# Patient Record
Sex: Female | Born: 1960
Health system: Southern US, Community
[De-identification: ages and names within clinical notes are randomized; demographics above are authoritative.]

## PROBLEM LIST (undated history)

## (undated) DIAGNOSIS — M1990 Unspecified osteoarthritis, unspecified site: Secondary | ICD-10-CM

## (undated) DIAGNOSIS — K649 Unspecified hemorrhoids: Secondary | ICD-10-CM

## (undated) DIAGNOSIS — R011 Cardiac murmur, unspecified: Secondary | ICD-10-CM

## (undated) DIAGNOSIS — I499 Cardiac arrhythmia, unspecified: Secondary | ICD-10-CM

## (undated) DIAGNOSIS — K219 Gastro-esophageal reflux disease without esophagitis: Secondary | ICD-10-CM

## (undated) DIAGNOSIS — F419 Anxiety disorder, unspecified: Secondary | ICD-10-CM

## (undated) DIAGNOSIS — Z87442 Personal history of urinary calculi: Secondary | ICD-10-CM

## (undated) DIAGNOSIS — I739 Peripheral vascular disease, unspecified: Secondary | ICD-10-CM

## (undated) DIAGNOSIS — Z8719 Personal history of other diseases of the digestive system: Secondary | ICD-10-CM

## (undated) DIAGNOSIS — J189 Pneumonia, unspecified organism: Secondary | ICD-10-CM

## (undated) DIAGNOSIS — E669 Obesity, unspecified: Secondary | ICD-10-CM

## (undated) DIAGNOSIS — I10 Essential (primary) hypertension: Secondary | ICD-10-CM

## (undated) DIAGNOSIS — I2699 Other pulmonary embolism without acute cor pulmonale: Secondary | ICD-10-CM

## (undated) DIAGNOSIS — F1020 Alcohol dependence, uncomplicated: Secondary | ICD-10-CM

## (undated) DIAGNOSIS — E785 Hyperlipidemia, unspecified: Secondary | ICD-10-CM

## (undated) DIAGNOSIS — Z6841 Body Mass Index (BMI) 40.0 and over, adult: Secondary | ICD-10-CM

## (undated) DIAGNOSIS — K760 Fatty (change of) liver, not elsewhere classified: Secondary | ICD-10-CM

## (undated) DIAGNOSIS — Z86010 Personal history of colonic polyps: Secondary | ICD-10-CM

## (undated) DIAGNOSIS — R7302 Impaired glucose tolerance (oral): Secondary | ICD-10-CM

## (undated) DIAGNOSIS — Z8679 Personal history of other diseases of the circulatory system: Secondary | ICD-10-CM

## (undated) DIAGNOSIS — N200 Calculus of kidney: Secondary | ICD-10-CM

## (undated) DIAGNOSIS — F1010 Alcohol abuse, uncomplicated: Secondary | ICD-10-CM

## (undated) DIAGNOSIS — R0600 Dyspnea, unspecified: Secondary | ICD-10-CM

## (undated) HISTORY — PX: KNEE ARTHROSCOPY: SUR90

## (undated) HISTORY — DX: Anxiety disorder, unspecified: F41.9

## (undated) HISTORY — PX: BREAST BIOPSY: SHX20

## (undated) HISTORY — DX: Essential (primary) hypertension: I10

## (undated) HISTORY — DX: Hyperlipidemia, unspecified: E78.5

## (undated) HISTORY — DX: Gastro-esophageal reflux disease without esophagitis: K21.9

## (undated) HISTORY — PX: OTHER SURGICAL HISTORY: SHX169

## (undated) HISTORY — DX: Personal history of other diseases of the circulatory system: Z86.79

## (undated) HISTORY — DX: Alcohol abuse, uncomplicated: F10.10

## (undated) HISTORY — DX: Body Mass Index (BMI) 40.0 and over, adult: Z684

## (undated) HISTORY — DX: Obesity, unspecified: E66.9

## (undated) HISTORY — DX: Personal history of colonic polyps: Z86.010

## (undated) HISTORY — DX: Unspecified osteoarthritis, unspecified site: M19.90

## (undated) HISTORY — DX: Personal history of other diseases of the digestive system: Z87.19

## (undated) HISTORY — PX: ABDOMINAL HYSTERECTOMY: SHX81

## (undated) HISTORY — DX: Impaired glucose tolerance (oral): R73.02

## (undated) HISTORY — DX: Calculus of kidney: N20.0

## (undated) NOTE — *Deleted (*Deleted)
PROGRESS NOTE    [name]  ZOX:[id number] DOB: [birth year] DOA: 12/19/2019 PCP: Corwin Levins, MD (Confirm with patient/family/NH records and if not entered, this HAS to be entered at [facility] point of entry. "No PCP" if truly none.)   Chief Complaint  Patient presents with  . Dizziness    Brief Narrative: (Start on day 1 of progress note - keep it brief and live) ***   Assessment & Plan:   Principal Problem:   Alcohol dependence with withdrawal (HCC) Active Problems:   Essential hypertension   Extrinsic asthma   Impaired glucose tolerance   Obesity, Class III, BMI 40-49.9 (morbid obesity) (HCC)   Hyperlipidemia   History of pulmonary embolism   ***   DVT prophylaxis: (Lovenox/Heparin/SCD's/anticoagulated/None (if comfort care) Code Status: (Full/Partial - specify details) Family Communication: (Specify name, relationship & date discussed. NO "discussed with patient") Disposition:   Status is: Observation  {Observation:23811}  Dispo: The patient is from: {From:23814}              Anticipated d/c is to: {To:23815}              Anticipated d/c date is: {Days:23816}              Patient currently {Medically stable:23817}       Consultants:   ***  Procedures: (Don't include imaging studies which can be auto populated. Include things that cannot be auto populated i.e. Echo, Carotid and venous dopplers, Foley, Bipap, HD, tubes/drains, wound vac, central lines etc)  ***  Antimicrobials: (specify start and planned stop date. Auto populated tables are space occupying and do not give end dates)  ***    Subjective: ***  Objective: Vitals:   12/20/19 0145 12/20/19 0152 12/20/19 0605 12/20/19 0856  BP:  (!) 163/105 (!) 145/99 137/82  Pulse:  95 (!) 106 (!) 106  Resp:  (!) 22 18 20   Temp:  98.1 F (36.7 C) 98.2 F (36.8 C) 97.8 F (36.6 C)  TempSrc:  Oral Oral Oral  SpO2:  98% 96% 96%  Weight: 135.8 kg     Height: 5\' 1"  (1.549 m)        Intake/Output Summary (Last 24 hours) at 12/20/2019 1137 Last data filed at 12/20/2019 0600 Gross per 24 hour  Intake 306.25 ml  Output 1 ml  Net 305.25 ml   Filed Weights   12/19/19 0858 12/20/19 0145  Weight: 113.4 kg 135.8 kg    Examination:  General exam: Appears calm and comfortable  Respiratory system: Clear to auscultation. Respiratory effort normal. Cardiovascular system: S1 & S2 heard, RRR. No JVD, murmurs, rubs, gallops or clicks. No pedal edema. Gastrointestinal system: Abdomen is nondistended, soft and nontender. No organomegaly or masses felt. Normal bowel sounds heard. Central nervous system: Alert and oriented. No focal neurological deficits. Extremities: Symmetric 5 x 5 power. Skin: No rashes, lesions or ulcers Psychiatry: Judgement and insight appear normal. Mood & affect appropriate.     Data Reviewed: I have personally reviewed following labs and imaging studies  CBC: Recent Labs  Lab 12/19/19 0439  WBC 9.5  HGB 15.3*  HCT 47.8*  MCV 91.0  PLT 279    Basic Metabolic Panel: Recent Labs  Lab 12/19/19 0439 12/19/19 0706  NA 138  --   K 3.9  --   CL 99  --   CO2 26  --   GLUCOSE 107*  --   BUN 13  --   CREATININE 0.90  --  CALCIUM 9.1  --   MG  --  2.0  PHOS  --  3.4    GFR: Estimated Creatinine Clearance: 89.3 mL/min (by C-G formula based on SCr of 0.9 mg/dL).  Liver Function Tests: Recent Labs  Lab 12/19/19 0706  AST 104*  ALT 99*  ALKPHOS 99  BILITOT 0.9  PROT 7.9  ALBUMIN 3.5    CBG: Recent Labs  Lab 12/19/19 0902 12/19/19 1220 12/19/19 1644 12/19/19 2228 12/20/19 0808  GLUCAP 190* 94 96 123* 119*     Recent Results (from the past 240 hour(s))  Resp Panel by RT PCR (RSV, Flu A&B, Covid) - Nasopharyngeal Swab     Status: None   Collection Time: 12/19/19  8:59 AM   Specimen: Nasopharyngeal Swab  Result Value Ref Range Status   SARS Coronavirus 2 by RT PCR NEGATIVE NEGATIVE Final    Comment: (NOTE) SARS-CoV-2  target nucleic acids are NOT DETECTED.  The SARS-CoV-2 RNA is generally detectable in upper respiratoy specimens during the acute phase of infection. The lowest concentration of SARS-CoV-2 viral copies this assay can detect is 131 copies/mL. A negative result does not preclude SARS-Cov-2 infection and should not be used as the sole basis for treatment or other patient management decisions. A negative result may occur with  improper specimen collection/handling, submission of specimen other than nasopharyngeal swab, presence of viral mutation(s) within the areas targeted by this assay, and inadequate number of viral copies (<131 copies/mL). A negative result must be combined with clinical observations, patient history, and epidemiological information. The expected result is Negative.  Fact Sheet for Patients:  https://www.moore.com/  Fact Sheet for Healthcare Providers:  https://www.young.biz/  This test is no t yet approved or cleared by the [place] FDA and  has been authorized for detection and/or diagnosis of SARS-CoV-2 by FDA under an Emergency Use Authorization (EUA). This EUA will remain  in effect (meaning this test can be used) for the duration of the COVID-19 declaration under Section 564(b)(1) of the Act, 21 U.S.C. section 360bbb-3(b)(1), unless the authorization is terminated or revoked sooner.     Influenza A by PCR NEGATIVE NEGATIVE Final   Influenza B by PCR NEGATIVE NEGATIVE Final    Comment: (NOTE) The Xpert Xpress SARS-CoV-2/FLU/RSV assay is intended as an aid in  the diagnosis of influenza from Nasopharyngeal swab specimens and  should not be used as a sole basis for treatment. Nasal washings and  aspirates are unacceptable for Xpert Xpress SARS-CoV-2/FLU/RSV  testing.  Fact Sheet for Patients: https://www.moore.com/  Fact Sheet for Healthcare Providers: https://www.young.biz/   This test is not yet approved or cleared by the [place] FDA and  has been authorized for detection and/or diagnosis of SARS-CoV-2 by  FDA under an Emergency Use Authorization (EUA). This EUA will remain  in effect (meaning this test can be used) for the duration of the  Covid-19 declaration under Section 564(b)(1) of the Act, 21  U.S.C. section 360bbb-3(b)(1), unless the authorization is  terminated or revoked.    Respiratory Syncytial Virus by PCR NEGATIVE NEGATIVE Final    Comment: (NOTE) Fact Sheet for Patients: https://www.moore.com/  Fact Sheet for Healthcare Providers: https://www.young.biz/  This test is not yet approved or cleared by the [place] FDA and  has been authorized for detection and/or diagnosis of SARS-CoV-2 by  FDA under an Emergency Use Authorization (EUA). This EUA will remain  in effect (meaning this test can be used) for the duration of the  COVID-19 declaration under  Section 564(b)(1) of the Act, 21 U.S.C.  section 360bbb-3(b)(1), unless the authorization is terminated or  revoked. Performed at [facility], [address] [place], [place] [address]          Radiology Studies: No results found.      Scheduled Meds: . apixaban  5 mg Oral BID  . chlordiazePOXIDE  25 mg Oral QID   Followed by  . [START ON 12/21/2019] chlordiazePOXIDE  25 mg Oral TID   Followed by  . [START ON 12/22/2019] chlordiazePOXIDE  25 mg Oral BH-qamhs   Followed by  . [START ON 12/23/2019] chlordiazePOXIDE  25 mg Oral Daily  . cloNIDine  0.1 mg Oral BID  . docusate sodium  100 mg Oral BID  . famotidine  20 mg Oral Daily  . fluticasone furoate-vilanterol  1 puff Inhalation Daily  . folic acid  1 mg Oral Daily  . losartan  100 mg Oral Daily   And  . hydrochlorothiazide  25 mg Oral Daily  . insulin aspart  0-15 Units Subcutaneous TID WC  . lactulose  20 g Oral Once  . LORazepam  0-4 mg Intravenous Q6H    Followed by  . [START ON 12/21/2019] LORazepam  0-4 mg Intravenous Q12H  . multivitamin with minerals  1 tablet Oral Daily  . sodium chloride flush  3 mL Intravenous Q12H  . thiamine  100 mg Oral Daily   Or  . thiamine  100 mg Intravenous Daily   Continuous Infusions: . lactated ringers 75 mL/hr at 12/20/19 0211     LOS: 0 days    Time spent: ***    Ramiro Harvest, MD [facility]   To contact the attending provider between 7A-7P or the covering provider during after hours 7P-7A, please log into the web site [email or web address] and access using universal [place] password for that web site. If you do not have the password, please call the hospital operator.  12/20/2019, 11:37 AM

---

## 1997-04-28 ENCOUNTER — Other Ambulatory Visit: Admission: RE | Admit: 1997-04-28 | Discharge: 1997-04-28 | Payer: Self-pay | Admitting: Gynecology

## 1997-09-07 ENCOUNTER — Emergency Department (HOSPITAL_COMMUNITY): Admission: EM | Admit: 1997-09-07 | Discharge: 1997-09-07 | Payer: Self-pay | Admitting: Emergency Medicine

## 1997-09-11 ENCOUNTER — Inpatient Hospital Stay (HOSPITAL_COMMUNITY): Admission: AD | Admit: 1997-09-11 | Discharge: 1997-09-13 | Payer: Self-pay | Admitting: Critical Care Medicine

## 1998-08-20 ENCOUNTER — Emergency Department (HOSPITAL_COMMUNITY): Admission: EM | Admit: 1998-08-20 | Discharge: 1998-08-20 | Payer: Self-pay | Admitting: Emergency Medicine

## 1998-08-20 ENCOUNTER — Encounter: Payer: Self-pay | Admitting: Emergency Medicine

## 1998-12-04 ENCOUNTER — Emergency Department (HOSPITAL_COMMUNITY): Admission: EM | Admit: 1998-12-04 | Discharge: 1998-12-04 | Payer: Self-pay | Admitting: Emergency Medicine

## 1999-04-22 ENCOUNTER — Other Ambulatory Visit: Admission: RE | Admit: 1999-04-22 | Discharge: 1999-04-22 | Payer: Self-pay | Admitting: Gynecology

## 2000-05-07 ENCOUNTER — Other Ambulatory Visit: Admission: RE | Admit: 2000-05-07 | Discharge: 2000-05-07 | Payer: Self-pay | Admitting: Gynecology

## 2000-08-11 ENCOUNTER — Encounter: Admission: RE | Admit: 2000-08-11 | Discharge: 2000-08-11 | Payer: Self-pay | Admitting: Internal Medicine

## 2000-08-11 ENCOUNTER — Encounter: Payer: Self-pay | Admitting: Internal Medicine

## 2001-02-25 ENCOUNTER — Other Ambulatory Visit: Admission: RE | Admit: 2001-02-25 | Discharge: 2001-02-25 | Payer: Self-pay | Admitting: Gynecology

## 2001-06-14 ENCOUNTER — Emergency Department (HOSPITAL_COMMUNITY): Admission: EM | Admit: 2001-06-14 | Discharge: 2001-06-14 | Payer: Self-pay | Admitting: Emergency Medicine

## 2001-06-14 ENCOUNTER — Encounter: Payer: Self-pay | Admitting: Emergency Medicine

## 2001-09-06 ENCOUNTER — Emergency Department (HOSPITAL_COMMUNITY): Admission: EM | Admit: 2001-09-06 | Discharge: 2001-09-06 | Payer: Self-pay

## 2003-03-07 ENCOUNTER — Ambulatory Visit (HOSPITAL_COMMUNITY): Admission: RE | Admit: 2003-03-07 | Discharge: 2003-03-07 | Payer: Self-pay | Admitting: Internal Medicine

## 2003-03-08 ENCOUNTER — Ambulatory Visit (HOSPITAL_COMMUNITY): Admission: RE | Admit: 2003-03-08 | Discharge: 2003-03-08 | Payer: Self-pay | Admitting: Internal Medicine

## 2003-03-28 ENCOUNTER — Ambulatory Visit (HOSPITAL_COMMUNITY): Admission: RE | Admit: 2003-03-28 | Discharge: 2003-03-28 | Payer: Self-pay | Admitting: Internal Medicine

## 2003-05-03 ENCOUNTER — Ambulatory Visit (HOSPITAL_COMMUNITY): Admission: RE | Admit: 2003-05-03 | Discharge: 2003-05-03 | Payer: Self-pay | Admitting: Internal Medicine

## 2003-09-24 ENCOUNTER — Emergency Department (HOSPITAL_COMMUNITY): Admission: EM | Admit: 2003-09-24 | Discharge: 2003-09-24 | Payer: Self-pay | Admitting: Emergency Medicine

## 2003-11-02 ENCOUNTER — Emergency Department (HOSPITAL_COMMUNITY): Admission: EM | Admit: 2003-11-02 | Discharge: 2003-11-02 | Payer: Self-pay | Admitting: Family Medicine

## 2003-12-19 ENCOUNTER — Emergency Department (HOSPITAL_COMMUNITY): Admission: EM | Admit: 2003-12-19 | Discharge: 2003-12-19 | Payer: Self-pay | Admitting: Emergency Medicine

## 2004-02-19 ENCOUNTER — Ambulatory Visit: Payer: Self-pay | Admitting: Internal Medicine

## 2004-02-22 ENCOUNTER — Ambulatory Visit: Payer: Self-pay | Admitting: Internal Medicine

## 2004-02-23 ENCOUNTER — Ambulatory Visit: Payer: Self-pay | Admitting: *Deleted

## 2004-02-24 ENCOUNTER — Ambulatory Visit (HOSPITAL_COMMUNITY): Admission: RE | Admit: 2004-02-24 | Discharge: 2004-02-24 | Payer: Self-pay | Admitting: Internal Medicine

## 2004-03-15 ENCOUNTER — Ambulatory Visit (HOSPITAL_COMMUNITY): Admission: RE | Admit: 2004-03-15 | Discharge: 2004-03-15 | Payer: Self-pay | Admitting: Family Medicine

## 2004-03-15 ENCOUNTER — Ambulatory Visit: Payer: Self-pay | Admitting: Internal Medicine

## 2004-03-20 ENCOUNTER — Ambulatory Visit: Payer: Self-pay | Admitting: Internal Medicine

## 2004-03-29 ENCOUNTER — Ambulatory Visit: Payer: Self-pay | Admitting: Family Medicine

## 2004-04-12 ENCOUNTER — Ambulatory Visit: Payer: Self-pay | Admitting: Internal Medicine

## 2004-06-21 ENCOUNTER — Ambulatory Visit: Payer: Self-pay | Admitting: Critical Care Medicine

## 2005-01-25 ENCOUNTER — Emergency Department (HOSPITAL_COMMUNITY): Admission: EM | Admit: 2005-01-25 | Discharge: 2005-01-25 | Payer: Self-pay | Admitting: Family Medicine

## 2005-01-27 ENCOUNTER — Encounter: Admission: RE | Admit: 2005-01-27 | Discharge: 2005-01-27 | Payer: Self-pay | Admitting: Internal Medicine

## 2005-02-07 ENCOUNTER — Encounter: Admission: RE | Admit: 2005-02-07 | Discharge: 2005-02-07 | Payer: Self-pay | Admitting: Internal Medicine

## 2005-04-08 ENCOUNTER — Encounter: Admission: RE | Admit: 2005-04-08 | Discharge: 2005-04-08 | Payer: Self-pay | Admitting: Internal Medicine

## 2005-05-16 ENCOUNTER — Emergency Department (HOSPITAL_COMMUNITY): Admission: EM | Admit: 2005-05-16 | Discharge: 2005-05-16 | Payer: Self-pay | Admitting: Family Medicine

## 2005-09-19 ENCOUNTER — Ambulatory Visit (HOSPITAL_COMMUNITY): Admission: RE | Admit: 2005-09-19 | Discharge: 2005-09-19 | Payer: Self-pay | Admitting: Gastroenterology

## 2005-09-19 ENCOUNTER — Encounter (INDEPENDENT_AMBULATORY_CARE_PROVIDER_SITE_OTHER): Payer: Self-pay | Admitting: Specialist

## 2005-11-19 ENCOUNTER — Ambulatory Visit: Payer: Self-pay | Admitting: Critical Care Medicine

## 2006-02-10 LAB — HM COLONOSCOPY: HM Colonoscopy: ABNORMAL

## 2006-03-20 ENCOUNTER — Encounter: Admission: RE | Admit: 2006-03-20 | Discharge: 2006-03-20 | Payer: Self-pay | Admitting: Internal Medicine

## 2006-05-06 ENCOUNTER — Ambulatory Visit: Payer: Self-pay | Admitting: Critical Care Medicine

## 2006-05-27 ENCOUNTER — Emergency Department (HOSPITAL_COMMUNITY): Admission: EM | Admit: 2006-05-27 | Discharge: 2006-05-27 | Payer: Self-pay | Admitting: Family Medicine

## 2006-06-17 ENCOUNTER — Emergency Department (HOSPITAL_COMMUNITY): Admission: EM | Admit: 2006-06-17 | Discharge: 2006-06-17 | Payer: Self-pay | Admitting: Emergency Medicine

## 2007-02-09 ENCOUNTER — Telehealth (INDEPENDENT_AMBULATORY_CARE_PROVIDER_SITE_OTHER): Payer: Self-pay | Admitting: *Deleted

## 2007-03-23 ENCOUNTER — Encounter: Admission: RE | Admit: 2007-03-23 | Discharge: 2007-03-23 | Payer: Self-pay | Admitting: Internal Medicine

## 2007-03-26 ENCOUNTER — Telehealth (INDEPENDENT_AMBULATORY_CARE_PROVIDER_SITE_OTHER): Payer: Self-pay | Admitting: *Deleted

## 2007-04-05 ENCOUNTER — Telehealth (INDEPENDENT_AMBULATORY_CARE_PROVIDER_SITE_OTHER): Payer: Self-pay | Admitting: *Deleted

## 2007-04-24 ENCOUNTER — Encounter: Payer: Self-pay | Admitting: Critical Care Medicine

## 2007-04-24 DIAGNOSIS — J383 Other diseases of vocal cords: Secondary | ICD-10-CM | POA: Insufficient documentation

## 2007-04-24 DIAGNOSIS — E669 Obesity, unspecified: Secondary | ICD-10-CM | POA: Insufficient documentation

## 2007-04-24 DIAGNOSIS — I10 Essential (primary) hypertension: Secondary | ICD-10-CM | POA: Insufficient documentation

## 2007-04-24 DIAGNOSIS — K219 Gastro-esophageal reflux disease without esophagitis: Secondary | ICD-10-CM | POA: Insufficient documentation

## 2007-04-24 DIAGNOSIS — J45909 Unspecified asthma, uncomplicated: Secondary | ICD-10-CM | POA: Insufficient documentation

## 2007-04-24 DIAGNOSIS — J329 Chronic sinusitis, unspecified: Secondary | ICD-10-CM | POA: Insufficient documentation

## 2007-06-28 ENCOUNTER — Telehealth (INDEPENDENT_AMBULATORY_CARE_PROVIDER_SITE_OTHER): Payer: Self-pay | Admitting: *Deleted

## 2007-07-27 ENCOUNTER — Telehealth (INDEPENDENT_AMBULATORY_CARE_PROVIDER_SITE_OTHER): Payer: Self-pay | Admitting: *Deleted

## 2007-09-13 ENCOUNTER — Emergency Department (HOSPITAL_COMMUNITY): Admission: EM | Admit: 2007-09-13 | Discharge: 2007-09-13 | Payer: Self-pay | Admitting: Emergency Medicine

## 2007-09-14 ENCOUNTER — Ambulatory Visit: Payer: Self-pay | Admitting: Family Medicine

## 2007-09-14 DIAGNOSIS — Z8679 Personal history of other diseases of the circulatory system: Secondary | ICD-10-CM | POA: Insufficient documentation

## 2007-09-14 HISTORY — DX: Personal history of other diseases of the circulatory system: Z86.79

## 2007-09-16 ENCOUNTER — Telehealth: Payer: Self-pay | Admitting: Family Medicine

## 2007-09-27 ENCOUNTER — Telehealth (INDEPENDENT_AMBULATORY_CARE_PROVIDER_SITE_OTHER): Payer: Self-pay | Admitting: *Deleted

## 2007-11-30 ENCOUNTER — Emergency Department (HOSPITAL_COMMUNITY): Admission: EM | Admit: 2007-11-30 | Discharge: 2007-11-30 | Payer: Self-pay | Admitting: Emergency Medicine

## 2008-02-02 ENCOUNTER — Ambulatory Visit: Payer: Self-pay | Admitting: Critical Care Medicine

## 2008-04-07 ENCOUNTER — Encounter: Payer: Self-pay | Admitting: Internal Medicine

## 2008-04-07 LAB — CONVERTED CEMR LAB: Pap Smear: NORMAL

## 2008-05-01 ENCOUNTER — Emergency Department (HOSPITAL_COMMUNITY): Admission: EM | Admit: 2008-05-01 | Discharge: 2008-05-01 | Payer: Self-pay | Admitting: Emergency Medicine

## 2008-05-16 ENCOUNTER — Telehealth (INDEPENDENT_AMBULATORY_CARE_PROVIDER_SITE_OTHER): Payer: Self-pay | Admitting: *Deleted

## 2008-06-16 ENCOUNTER — Encounter: Admission: RE | Admit: 2008-06-16 | Discharge: 2008-06-16 | Payer: Self-pay | Admitting: Sports Medicine

## 2008-06-19 ENCOUNTER — Telehealth (INDEPENDENT_AMBULATORY_CARE_PROVIDER_SITE_OTHER): Payer: Self-pay | Admitting: *Deleted

## 2008-07-11 ENCOUNTER — Emergency Department (HOSPITAL_COMMUNITY): Admission: EM | Admit: 2008-07-11 | Discharge: 2008-07-11 | Payer: Self-pay | Admitting: Family Medicine

## 2008-09-17 ENCOUNTER — Emergency Department (HOSPITAL_COMMUNITY): Admission: EM | Admit: 2008-09-17 | Discharge: 2008-09-17 | Payer: Self-pay | Admitting: Emergency Medicine

## 2008-11-06 ENCOUNTER — Telehealth: Payer: Self-pay | Admitting: Critical Care Medicine

## 2008-11-13 ENCOUNTER — Emergency Department (HOSPITAL_COMMUNITY): Admission: EM | Admit: 2008-11-13 | Discharge: 2008-11-13 | Payer: Self-pay | Admitting: Emergency Medicine

## 2009-02-19 ENCOUNTER — Encounter: Payer: Self-pay | Admitting: Critical Care Medicine

## 2009-02-19 ENCOUNTER — Telehealth: Payer: Self-pay | Admitting: Critical Care Medicine

## 2009-03-31 ENCOUNTER — Encounter: Payer: Self-pay | Admitting: Pulmonary Disease

## 2009-04-09 LAB — HM MAMMOGRAPHY: HM Mammogram: NORMAL

## 2009-04-13 ENCOUNTER — Encounter: Admission: RE | Admit: 2009-04-13 | Discharge: 2009-04-13 | Payer: Self-pay | Admitting: Internal Medicine

## 2009-04-13 ENCOUNTER — Ambulatory Visit: Payer: Self-pay | Admitting: Critical Care Medicine

## 2009-04-17 ENCOUNTER — Emergency Department (HOSPITAL_COMMUNITY): Admission: EM | Admit: 2009-04-17 | Discharge: 2009-04-17 | Payer: Self-pay | Admitting: Family Medicine

## 2009-04-27 ENCOUNTER — Emergency Department (HOSPITAL_COMMUNITY): Admission: EM | Admit: 2009-04-27 | Discharge: 2009-04-27 | Payer: Self-pay | Admitting: Family Medicine

## 2009-05-14 ENCOUNTER — Emergency Department (HOSPITAL_COMMUNITY): Admission: EM | Admit: 2009-05-14 | Discharge: 2009-05-14 | Payer: Self-pay | Admitting: Emergency Medicine

## 2009-07-03 ENCOUNTER — Emergency Department (HOSPITAL_COMMUNITY): Admission: EM | Admit: 2009-07-03 | Discharge: 2009-07-03 | Payer: Self-pay | Admitting: Family Medicine

## 2009-07-03 ENCOUNTER — Emergency Department (HOSPITAL_COMMUNITY): Admission: EM | Admit: 2009-07-03 | Discharge: 2009-07-03 | Payer: Self-pay | Admitting: Emergency Medicine

## 2009-07-13 ENCOUNTER — Telehealth (INDEPENDENT_AMBULATORY_CARE_PROVIDER_SITE_OTHER): Payer: Self-pay | Admitting: *Deleted

## 2009-07-20 ENCOUNTER — Ambulatory Visit: Payer: Self-pay | Admitting: Internal Medicine

## 2009-07-20 DIAGNOSIS — R079 Chest pain, unspecified: Secondary | ICD-10-CM | POA: Insufficient documentation

## 2009-10-11 ENCOUNTER — Encounter: Payer: Self-pay | Admitting: Internal Medicine

## 2009-10-17 ENCOUNTER — Telehealth (INDEPENDENT_AMBULATORY_CARE_PROVIDER_SITE_OTHER): Payer: Self-pay | Admitting: *Deleted

## 2009-12-04 ENCOUNTER — Emergency Department (HOSPITAL_COMMUNITY): Admission: EM | Admit: 2009-12-04 | Discharge: 2009-12-04 | Payer: Self-pay | Admitting: Emergency Medicine

## 2010-01-16 ENCOUNTER — Telehealth (INDEPENDENT_AMBULATORY_CARE_PROVIDER_SITE_OTHER): Payer: Self-pay | Admitting: *Deleted

## 2010-01-23 ENCOUNTER — Telehealth (INDEPENDENT_AMBULATORY_CARE_PROVIDER_SITE_OTHER): Payer: Self-pay | Admitting: *Deleted

## 2010-01-23 ENCOUNTER — Ambulatory Visit: Payer: Self-pay | Admitting: Critical Care Medicine

## 2010-02-14 ENCOUNTER — Telehealth: Payer: Self-pay | Admitting: Critical Care Medicine

## 2010-03-03 ENCOUNTER — Encounter: Payer: Self-pay | Admitting: Internal Medicine

## 2010-03-12 NOTE — Progress Notes (Signed)
Summary: need samples  Phone Note Call from Patient   Caller: Patient Call For: wright Summary of Call: need samples of advair Initial call taken by: Lehman Prom,  Jun 28, 2007 1:37 PM  Follow-up for Phone Call        No samples will be provided.  Pt not seen since March of 08!  Needs rov lmomtcb Vernie Murders  Jun 28, 2007 1:47 PM   Additional Follow-up for Phone Call Additional follow up Details #1::        Attempted TCB pt.  LMOM pt needs to follow-up with PW before any samples can be provided for pt it has been over 1 year since pt was last seen.  Left phone # TCB for appt. Additional Follow-up by: Cloyde Reams RN,  Jun 28, 2007 3:46 PM

## 2010-03-12 NOTE — Assessment & Plan Note (Signed)
Summary: Pulmonayr/ acte ext ov - try off ace     Primary Provider/Referring Provider:  Eddie Candle MD  CC:  Acute visit.  Pt c/o prod cough with yellow to clear sputum x 10 days.  She also has noticed increased SOB over the past 10 days.  She gets SOB with or without any exertion.Marland Kitchen  History of Present Illness: 7 yobf minimal  smoker quit in her 20's  in  Moderate persistent asthma, VCD, GERD, obesity    April 13, 2009  Over the past year ok The pt  is on ace inhibitor and has heart palpitations but denies throat irritation or clearing of throat. no real cough The advair has helped the pt The pt does not regularly use the saba.   July 20, 2009 cc cough started x 2 weeks ago tends to be yellow in am. baseline  = sob with anything more than slow adls and does use the saba twice daily but not typically at night.  now acute worse with severe cough to point of chest discomfort generalized during coughing fits but worse post aspect on right and having palpitations on lisinopril that she says has hztz in it (our records say no).  Sputum is slt discolored. Pt denies any significant sore throat, dysphagia, itching, sneezing,  nasal congestion or excess secretions,  fever, chills, sweats, unintended wt loss, laterallizing pleuritic or exertional cp, hempoptysis, change in activity tolerance  orthopnea pnd or leg swelling. Pt also denies any obvious fluctuation in symptoms with weather or environmental change or other alleviating or aggravating factors x worse cough and sob when working chronically (attributes to bad air there).   Current Medications (verified): 1)  Lisinopril 20 Mg  Tabs (Lisinopril) .... Once Daily 2)  Advair Diskus 250-50 Mcg/dose  Misc (Fluticasone-Salmeterol) .... One Puff Twice Daily 3)  Proair Hfa 108 (90 Base) Mcg/act Aers (Albuterol Sulfate) .... 2 Puffs Every 4-6 Hours As Needed  Allergies (verified): 1)  ! Morphine  Past History:  Past Medical History: GERD  (ICD-530.81) VOCAL CORD DISORDER (ICD-478.5) OBESITY (ICD-278.00) HYPERTENSION (ICD-401.9)    - Try off ace July 20, 2009 and on bystolic due to pseudoasthma and palpitations SINUSITIS (ICD-473.9) ASTHMA (ICD-493.90)    -FeV1 68% 2008  alcohol abuse  Social History: Occupation: Married Quit smoking in her 20's only smoked x 5 years Alcohol use-yes Drug use-no Regular exercise-no  Vital Signs:  Patient profile:   50 year old female Weight:      265 pounds O2 Sat:      98 % on Room air Temp:     98.3 degrees F oral Pulse rate:   83 / minute BP sitting:   106 / 70  (left arm) Cuff size:   large  Vitals Entered By: Vernie Murders (July 20, 2009 12:04 PM)  O2 Flow:  Room air  Physical Exam  Additional Exam:  wt 273 > 265 July 20, 2009 amb very anxious obese hoarse bf with subtle pseudowheeze resolves with purse lip maneuver  HEENT: nl dentition, turbinates, and orophanx. Nl external ear canals without cough reflex NECK :  without JVD/Nodes/TM/ nl carotid upstrokes bilaterally LUNGS: no acc muscle use, clear to A and P bilaterally without cough on insp or exp maneuvers CV:  RRR  no s3 or murmur or increase in P2, no edema  ABD:  soft and nontender with nl excursion in the supine position. No bruits or organomegaly, bowel sounds nl MS:  warm without deformities, calf tenderness,  cyanosis or clubbing SKIN: warm and dry without lesions   NEURO:  alert, approp, no deficits     CXR  Procedure date:  07/20/2009  Findings:      Stable cardiomegaly and peribronchial thickening.  No active lung disease.  Impression & Recommendations:  Problem # 1:  ASTHMA (ICD-493.90) DDX of  difficult airways managment all start with A and  include Adherence, Ace Inhibitors, Acid Reflux, Active Sinus Disease, Alpha 1 Antitripsin deficiency, Anxiety masquerading as Airways dz,  ABPA,  allergy(esp in young), Aspiration (esp in elderly), Adverse effects of DPI,  Active smokers, plus one B  =  Beta blocker use..   Adherence:  using a lot more saba than previously stated though not needing while sleeping, strongly suggesting component of uacs.  Acid reflux risk is obesity rec diet changes for now  Ace inhibitors,  always a concern here, though chronically don't seem to be bothering her (this is debatable  because she attributes many of her symptoms and need for saba to allergies, esp at work, and understates the amt of albuterol she's using to control her symptoms    Try off ACE first and then regroup  in two weeks  Problem # 2:  HYPERTENSION (ICD-401.9) Apparently on lisnopril/ hctz which may contribute to palp, try off and on Bystolic, the most beta -1  selective Beta blocker available in sample form, with bisoprolol the most selective generic choice  on the market vs switch to arb on return depending on her fomulary and responds   ACE inhibitors are problematic in  pts with airway complaints because  even experienced pulmonologists can't always distinguish ace effects from copd/asthma.  By themselves they don't actually cause a problem, much like oxygen can't by itself start a fire, but they certainly serve as a powerful catalyst or enhancer for any "fire"  or inflammatory process in the upper airway, be it caused by an ET  tube or more commonly reflux (especially in the obese or pts with known GERD or who are on biphoshonates).  In the era of ARB near equivalency until we have a better handle on the reversibility of the airway problem, it just makes sense to avoid ace entirely in the short run and then decide later, having established a level of airway control using a reasonable limited regimen, whether to add back ace but even then being very careful to observe the pt for worsening airway control and number of meds used/ needed to control symptoms (esp saba, which pt tends to mininimize/rationalize)  Problem # 3:  CHEST PAIN (ICD-786.50)  Prob mscp from coughing.    Orders: Est.  Patient Level IV ([address])  Medications Added to Medication List This Visit: 1)  Bystolic 5 Mg Tabs (Nebivolol hcl) .... One tablet daily 2)  Avelox 400 Mg Tabs (Moxifloxacin hcl) .... By mouth daily  Complete Medication List: 1)  Advair Diskus 250-50 Mcg/dose Misc (Fluticasone-salmeterol) .... One puff twice daily 2)  Bystolic 5 Mg Tabs (Nebivolol hcl) .... One tablet daily 3)  Avelox 400 Mg Tabs (Moxifloxacin hcl) .... By mouth daily 4)  Proair Hfa 108 (90 Base) Mcg/act Aers (Albuterol sulfate) .... 2 puffs every 4-6 hours as needed  Other Orders: T-2 View CXR (71020TC)  Patient Instructions: 1)  Stop lisnopril 2)  Bystolic 5 mg one daily 3)  Avelox 400 mg one daily x 5 days 4)  Robitussin dm 2 tsp every 4 hours as needed 5)  See Tammy NP in 2 weeks  with all your medications  your drug formulary and she will set you up to see Dr Delford Field    CXR  Procedure date:  07/20/2009  Findings:      Stable cardiomegaly and peribronchial thickening.  No active lung disease.

## 2010-03-12 NOTE — Progress Notes (Signed)
Summary: rx refill req  Phone Note Call from Patient   Caller: Patient Call For: wright Summary of Call: pt has an appt pending for 6/30. is requesting refill of [facility]air. [facility] [address]'s at work.  Initial call taken by: Tivis Ringer,  July 27, 2007 2:33 PM  Follow-up for Phone Call        new rx sent to pharmacy for #1 only pt has pending ov for 6/30 Follow-up by: Omolara Carol CMA,  July 27, 2007 3:56 PM    New/Updated Medications: PROAIR HFA 108 (90 BASE) MCG/ACT  AERS (ALBUTEROL SULFATE) 2 puffs every 4 hrs as needed   Prescriptions: PROAIR HFA 108 (90 BASE) MCG/ACT  AERS (ALBUTEROL SULFATE) 2 puffs every 4 hrs as needed  #1 x 0   Entered by:   Philipp Deputy CMA   Authorized by:   Storm Frisk MD   Signed by:   Noella Kipnis CMA on 07/27/2007   Method used:   [facility] to ...       [facility] [address] [phone][facility]       [address]       [place], [place]  [address]       Ph: [phone] or [phone]       Fax: [phone]   RxID:   [id number]

## 2010-03-12 NOTE — Miscellaneous (Signed)
Summary: Advair Rx  Clinical Lists Changes  Medications: Added new medication of ADVAIR DISKUS 250-50 MCG/DOSE  MISC (FLUTICASONE-SALMETEROL) One puff twice daily [BMN] - Signed Rx of ADVAIR DISKUS 250-50 MCG/DOSE  MISC (FLUTICASONE-SALMETEROL) One puff twice daily;  #1 x 0 Brand medically necessary;  Signed;  Entered by: Storm Frisk MD;  Authorized by: Storm Frisk MD;  Method used: [facility] [address] [phone]*, [address], [place], [place], [place]  [address], Ph: [phone] or [phone], Fax: [phone]    Prescriptions: ADVAIR DISKUS 250-50 MCG/DOSE  MISC (FLUTICASONE-SALMETEROL) One puff twice daily Brand medically necessary #1 x 0   Entered and Authorized by:   Storm Frisk MD   Signed by:   Storm Frisk MD on 02/19/2009   Method used:   Electronically to        [facility] [address] [phone]* (retail)       [address]       [place], [place]  [address]       Ph: [phone] or [phone]       Fax: [phone]   RxID:   [id number]

## 2010-03-12 NOTE — Miscellaneous (Signed)
Summary: phone visit  Clinical Lists Changes  pt called for ILI...has runny nose, sore throat, chest congestion with cough on nonpurulent mucus.  Low grade fever.  Denies myalgias, arthralgias.  No increased sob.  I asked her to take mucinex, otc cold meds for symptom relief.  Call office mond for apptm if not improving.  To er if worsens.  Appended Document: phone visit noted  pw

## 2010-03-12 NOTE — Progress Notes (Signed)
Summary: Advair refill  Phone Note Call from Patient   Caller: Patient Reason for Call: Refill Medication Summary of Call: Pt wanting advair refill  I ok'd this times one. Pt has appt in Feb and was told to keep appt.  Initial call taken by: Storm Frisk MD,  February 19, 2009 6:08 AM

## 2010-03-12 NOTE — Progress Notes (Signed)
Summary: SAMPLES  Phone Note Call from Patient Call back at [phone]   Caller: Patient Call For: Kennie Snedden Summary of Call: NEED ADVAIR AND ALBUTEROL SAMPLES Initial call taken by: Rickard Patience,  November 06, 2008 10:06 AM  Follow-up for Phone Call        Samples left up front for pt to pick up. Pt informed. Zackery Barefoot CMA  November 06, 2008 10:34 AM

## 2010-03-12 NOTE — Progress Notes (Signed)
Summary: returning call  Phone Note Call from Patient   Caller: Patient Call For: wright Summary of Call: returning call from lori (from today). ok to leave msg on phone. doesn't know why lori called her. Patient's chart has been requested. Initial call taken by: Tivis Ringer,  March 26, 2007 3:19 PM  Follow-up for Phone Call        Pt already called and cancelled her appt with Dr. Delford Field on 03-31-07. She did not [facility]. LMOM for her to call back first of the week and either speak to me or just go ahead and make new appt. Follow-up by: Michel Bickers CMA,  March 26, 2007 4:47 PM

## 2010-03-12 NOTE — Assessment & Plan Note (Signed)
Summary: WAS SEEN IN ER LAST NIGHT BY DR   Vital Signs:  Patient Profile:   50 Years Old Female Height:     615 inches Weight:      273 pounds Temp:     98.2 degrees F oral Pulse rate:   84 / minute Pulse rhythm:   regular BP sitting:   110 / 80  (left arm) Cuff size:   large  Vitals Entered By: Kern Reap CMA (September 14, 2007 3:56 PM)                 Chief Complaint:  follow up ER.  History of Present Illness: [name] is a 50 year old, married female, who comes in today as a new patient for evaluation of palpitations.  She states yesterday in approximately 6 p.m. she developed the sudden onset of rapid heart rate.  She went to the emergency room for evaluation.  She had a complete cardiac evaluation.  Two EKGs and 3 hours on a monitor.  Exam was negative and she was referred to Dr. Mayme Genta, cardiology for follow-up.  She called here and was made appointment to see me as an urgent basis.  I explained to her I would be happy to get things started doing an official evaluation and get cardiac involved as needed.  She is never a trial of palpitations in the past.  She is a nonsmoker, however, she drinks 3 or 4 cups of caffeinated coffee a day and drinks number two  fifths of liquor per week.  She's been hospitalized for one child, hysterectomy for fibroids.  Ovaries were left intact.  Pneumonia x 2.  No major illnesses.  No injuries.  No drug allergies.  Current medications as listed.  Review of systems negative    Current Allergies: ! MORPHINE  Past Medical History:    Reviewed history from 04/24/2007 and no changes required:       GERD (ICD-530.81)       VOCAL CORD DISORDER (ICD-478.5)       OBESITY (ICD-278.00)       HYPERTENSION (ICD-401.9)       SINUSITIS (ICD-473.9)       ASTHMA (ICD-493.90)        alcohol abuse   Social History:    Reviewed history and no changes required:       Occupation:       Married       Never Smoked       Alcohol use-yes  Drug use-no       Regular exercise-no   Risk Factors:  Tobacco use:  never Drug use:  no Alcohol use:  yes Exercise:  no   Review of Systems      See HPI   Physical Exam  General:     Well-developed,well-nourished,in no acute distress; alert,appropriate and cooperative throughout examination Head:     Normocephalic and atraumatic without obvious abnormalities. No apparent alopecia or balding. Eyes:     No corneal or conjunctival inflammation noted. EOMI. Perrla. Funduscopic exam benign, without hemorrhages, exudates or papilledema. Vision grossly normal. Ears:     External ear exam shows no significant lesions or deformities.  Otoscopic examination reveals clear canals, tympanic membranes are intact bilaterally without bulging, retraction, inflammation or discharge. Hearing is grossly normal bilaterally. Nose:     External nasal examination shows no deformity or inflammation. Nasal mucosa are pink and moist without lesions or exudates. Mouth:     Oral mucosa and oropharynx without lesions  or exudates.  Teeth in good repair. Neck:     No deformities, masses, or tenderness noted. Lungs:     Normal respiratory effort, chest expands symmetrically. Lungs are clear to auscultation, no crackles or wheezes. Heart:     Normal rate and regular rhythm. S1 and S2 normal without gallop, murmur, click, rub or other extra sounds.    Impression & Recommendations:  Problem # 1:  PALPITATIONS, HX OF (ICD-V12.50) Assessment: New  Complete Medication List: 1)  Pantoprazole Sodium 40 Mg Tbec (Pantoprazole sodium) .... Take 1 tablet by mouth once a day 2)  Lisinopril 20 Mg Tabs (Lisinopril) .... Once daily 3)  Advair Diskus 250-50 Mcg/dose Misc (Fluticasone-salmeterol) 4)  Proair Hfa 108 (90 Base) Mcg/act Aers (Albuterol sulfate) .... 2 puffs every 4 hrs as needed 5)  Atenolol 25 Mg Tabs (Atenolol) .... Take 1 tablet by mouth every morning   Patient Instructions: 1)  Stay on a  complete, caffeine and alcohol free diet begin a atenolol25 mg one tablet in the morning.  Return 6 weeks for follow-up   Prescriptions: ATENOLOL 25 MG  TABS (ATENOLOL) Take 1 tablet by mouth every morning  #100 x 3   Entered and Authorized by:   Roderick Pee MD   Signed by:   Roderick Pee MD on 09/14/2007   Method used:   Electronically sent to ...       [facility] [address] [phone]*       [address]       [place], [place]  [address]       Ph: [phone] or [phone]       Fax: [phone]   RxID:   [id number]  ]

## 2010-03-12 NOTE — Progress Notes (Signed)
Summary: samples request  Phone Note Call from Patient Call back at Home Phone [phone]   Caller: Patient Call For: wright Summary of Call: pt unemployed. requests samples of advair and proair.  Initial call taken by: Tivis Ringer,  May 16, 2008 3:17 PM  Follow-up for Phone Call        [facility] informing pt samples left at front desk.  Aundra Millet Reynolds LPN  May 16, 5425 3:25 PM

## 2010-03-12 NOTE — Progress Notes (Signed)
Summary: inhaler not working  Medications Added ALBUTEROL 90 MCG/ACT AERS (ALBUTEROL)  PANTOPRAZOLE SODIUM 40 MG TBEC (PANTOPRAZOLE SODIUM) Take 1 tablet by mouth once a day       Phone Note Call from Patient Call back at [phone]   Caller: Patient Call For: wright Reason for Call: Talk to Nurse, Talk to Doctor Summary of Call: pt changed from albuterol to proair inhaler.  Not feeling well, feels it's not working.  Wants to discuss w/nurse of doctor about changing to something else her insurance covers chart ordered Initial call taken by: Eugene Gavia,  February 09, 2007 1:02 PM  Follow-up for Phone Call        Pt states she cannot tell if proair is working, Would like to try ventolin or xopenex, both are covered by her insurance. Follow-up by: Jerolyn Shin,  February 09, 2007 2:59 PM  Additional Follow-up for Phone Call Additional follow up Details #1::        This is a common complaint of all the new HFA inhalers.  Have the pt stop by for a reinstruct on the proper use of an HFA device with one of the CMA/RNs Additional Follow-up by: Storm Frisk MD,  February 14, 2007 10:21 AM    Additional Follow-up for Phone Call Additional follow up Details #2::    Pt overdue for ov so she needs to schedule one.  LMOM TCB .................................................................Marland KitchenMarland KitchenVernie Murders  February 15, 2007 9:13 AM   Additional Follow-up for Phone Call Additional follow up Details #3:: Details for Additional Follow-up Action Taken: Called pt swork number unabe to receive calls at work ,left her another message at her home number reminding her to schedule appt with Dr. Delford Field to discuss her inhaler  New/Updated Medications: ALBUTEROL 90 MCG/ACT AERS (ALBUTEROL)  PANTOPRAZOLE SODIUM 40 MG TBEC (PANTOPRAZOLE SODIUM) Take 1 tablet by mouth once a day

## 2010-03-12 NOTE — Progress Notes (Signed)
Summary: SICK  Phone Note Call from Patient Call back at Home Phone [phone]   Caller: Patient Call For: [name] Summary of Call: SINUS INFECTION THROAT ITCHY AND DRAINAGE WANTS ANTIBODIC AND COUGH SYRUP [facility] Initial call taken by: Lacinda Axon,  July 13, 2009 12:26 PM  Follow-up for Phone Call        [facility] Vernie Murders  July 13, 2009 2:12 PM  Spoke with pt.  She is c/o scratchy throat, nasal congestion and drainage x 1 wk.  She would like abx called in.  I advised that PW out of the office and will need to sched appt with TP for 07/16/09.  I offered her an appt and she refused stating, "i might feel better by then".  She states that she does not understand why we "can't just give abx".  I advised that I would forward msg to TP. Please advise, thanks! Follow-up by: Vernie Murders,  July 13, 2009 4:57 PM  Additional Follow-up for Phone Call Additional follow up Details #1::        mucinex DM two times a day  saline rinses as needed  zyrtec once daily as needed drainage increase fludis.  if not better Please contact office for sooner follow up if symptoms do not improve or worsen  follow up for ov as scheduled.   Additional Follow-up by: Rubye Oaks NP,  July 13, 2009 4:59 PM    Additional Follow-up for Phone Call Additional follow up Details #2::    Spoke with pt and advised of the above recs per TP.  Pt verbalized understanding. Follow-up by: Vernie Murders,  July 13, 2009 5:05 PM

## 2010-03-12 NOTE — Progress Notes (Signed)
Summary: ? about meds  Phone Note Call from Patient Call back at [phone] 10-15 12:30 - 1:00, 3-3:15   Caller: Patient Call For: wright Reason for Call: Talk to Nurse Summary of Call: Need to ask nurse ? about meds she is taking. Initial call taken by: Eugene Gavia,  September 27, 2007 10:03 AM  Follow-up for Phone Call        Pt needing samples of Advair and Proair. Husband has been out of work and she will not have any insurance for another 2-3 weeks. She says she will call back to schedule a follow-up with Dr. Delford Field. She was instructed to ask for Lawson Fiscal when she calls back. I am leaving samples of the Advair and a discount card for the Proair at the front desk for her to pick up. Follow-up by: Michel Bickers CMA,  September 27, 2007 10:37 AM  Additional Follow-up for Phone Call Additional follow up Details #1::        Pt did not make an appointment when she picked up her samples.  Pt MUST make an appointment before she can have any more samples or further refills. Additional Follow-up by: Michel Bickers CMA,  September 27, 2007 4:46 PM

## 2010-03-12 NOTE — Progress Notes (Signed)
Summary: BP update  Phone Note Call from Patient Call back at Work Phone [phone] Call back at [phone]   Caller: Patient Call For: Tawanna Cooler Summary of Call: Pt questioning if she was to take the new med Atenolol 25 mg X 10 days only.  Informed pt OV note from 8/4 indicated she is to take the med daily, ROV in 6 weeks.  Pt wanted Dr Tawanna Cooler to be made aware of her BP readings in the AM averaging 96/59 and in the PM averaging 107/66. Pt reporting she has had no episodes of palpatations. Initial call taken by: Sid Falcon LPN,  September 16, 2007 10:36 AM  Follow-up for Phone Call        concur Follow-up by: Roderick Pee MD,  September 16, 2007 11:05 AM  Additional Follow-up for Phone Call Additional follow up Details #1::        Pt informed she is to continue on the Atenolol daily, F/U visit is scheduled in 6 weeks. Additional Follow-up by: Sid Falcon LPN,  September 16, 2007 12:13 PM

## 2010-03-12 NOTE — Progress Notes (Signed)
Summary: ProAir Samples > 1 sample proventil left up front for pt  Phone Note Call from Patient Call back at Home Phone [phone]   Caller: Patient Call For: Delford Field Reason for Call: Talk to Nurse Summary of Call: Pt requesting samples of ProAir (requesting 2), states she was able to get the Advair but not able to afford the ProAir after that. Initial call taken by: Zackery Barefoot CMA,  January 16, 2010 12:37 PM  Follow-up for Phone Call        [facility].  We only have Prventil HFA samples.  Checking to see if pt was okay with this.  Spoke with Crystal and she said this would be ok if pt agrees.  Samples are in triage office. Abigail Miyamoto RN  January 16, 2010 1:02 PM   called spoke with patient, advised that we only have 1 sample of proventil but that it is similar to the proair she is requesting.  pt okay with this and states that she has taken this in the past.  1 sample left up front for pt to pick up at her convenience. Follow-up by: Boone Master CNA/MA,  January 16, 2010 5:30 PM

## 2010-03-12 NOTE — Progress Notes (Signed)
Summary: samples proair and advair 250-77mcg left for pt to pick up  Phone Note Call from Patient   Caller: Patient Call For: wert Summary of Call: pt requests samples of proair and advair- would like at least 2 of advair. pt # [id number] Initial call taken by: Tivis Ringer, CNA,  October 17, 2009 12:52 PM  Follow-up for Phone Call        pt last seen by [facility] 07/2009 - told to follow up w/ TP for med cal in 2 weeks.  this has not been done.  needs appt scheduled for samples.  ATC # provided, rang twice then went silent.  LMOM TCB @ home #. Boone Master CNA/MA  October 17, 2009 3:08 PM   Additional Follow-up for Phone Call Additional follow up Details #1::        patient returned call.  she states that MW is not her physician here, PW is - MW saw patient as acute work-in.  2 samples advair 250-53mcg and 1 proair left up front for pt to pick up at her convenience.  pt aware. Additional Follow-up by: Boone Master CNA/MA,  October 17, 2009 4:58 PM

## 2010-03-12 NOTE — Assessment & Plan Note (Signed)
Summary: Pulmonary OV   Primary Provider/Referring Provider:  Eddie Candle MD  CC:  Followup asthma.  Pt last in Dec 2009.  She denies any complaints today.  Needs rx refills on proair and advair.Marland Kitchen  History of Present Illness: 50 yo AAF Moderate persistent asthma, VCD, GERD, obesity    April 13, 2009 4:41 PM Over the past year ok The pt  is on ace inhibitor and has heart palpitations but denies throat irritation or clearing of throat. no real cough The advair has helped the pt The pt does not regularly use the saba. Pt denies any significant sore throat, nasal congestion or excess secretions, fever, chills, sweats, unintended weight loss, pleurtic or exertional chest pain, orthopnea PND, or leg swelling Pt denies any increase in rescue therapy over baseline, denies waking up needing it or having any early am or nocturnal exacerbations of coughing/wheezing/or dyspnea.   Asthma History    Initial Asthma Severity Rating:    Age range: 12+ years    Symptoms: >2 days/week; not daily    Nighttime Awakenings: 0-2/month    Interferes w/ normal activity: minor limitations    SABA use (not for EIB): 0-2 days/week    Exacerbations requiring oral systemic steroids: 0-1/year    Asthma Severity Assessment: Mild Persistent   Preventive Screening-Counseling & Management  Alcohol-Tobacco     Smoking Status: never  Current Medications (verified): 1)  Lisinopril 20 Mg  Tabs (Lisinopril) .... Once Daily 2)  Advair Diskus 250-50 Mcg/dose  Misc (Fluticasone-Salmeterol) .... One Puff Twice Daily 3)  Proair Hfa 108 (90 Base) Mcg/act Aers (Albuterol Sulfate) .... 2 Puffs Every 4-6 Hours As Needed  Allergies (verified): 1)  ! Morphine  Past History:  Past medical, surgical, family and social histories (including risk factors) reviewed, and no changes noted (except as noted below).  Past Medical History: Reviewed history from 02/02/2008 and no changes required. GERD (ICD-530.81) VOCAL CORD  DISORDER (ICD-478.5) OBESITY (ICD-278.00) HYPERTENSION (ICD-401.9) SINUSITIS (ICD-473.9) ASTHMA (ICD-493.90)    -FeV1 68% 2008  alcohol abuse  Family History: Reviewed history and no changes required.  Social History: Reviewed history from 09/14/2007 and no changes required. Occupation: Married Never Smoked Alcohol use-yes Drug use-no Regular exercise-no  Review of Systems       The patient complains of shortness of breath with activity.  The patient denies shortness of breath at rest, productive cough, non-productive cough, coughing up blood, chest pain, irregular heartbeats, acid heartburn, indigestion, loss of appetite, weight change, abdominal pain, difficulty swallowing, sore throat, tooth/dental problems, headaches, nasal congestion/difficulty breathing through nose, sneezing, itching, ear ache, anxiety, depression, hand/feet swelling, joint stiffness or pain, rash, change in color of mucus, and fever.    Vital Signs:  Patient profile:   50 year old female Weight:      273 pounds BMI:     0.51 O2 Sat:      96 % on Room air Temp:     97.6 degrees F oral Pulse rate:   78 / minute BP sitting:   112 / 80  (left arm)  Vitals Entered By: Vernie Murders (April 13, 2009 4:32 PM)  O2 Flow:  Room air  Physical Exam  Additional Exam:  Gen: Pleasant, obese AAF  in no distress , normal affect ENT: no lesions, no post nasal drip Neck: No JVD, no TMG, no carotid bruits Lungs: No use of accessory muscles, no dullness to percussion, clear without rales or rhonchi Cardiovascular: RRR, heart sounds normal, no murmurs  or gallops, no peripheral edema Abdomen: soft and non-tender, no HSM, BS normal Musculoskeletal: No deformities, no cyanosis or clubbing Neuro: alert, non-focal     Impression & Recommendations:  Problem # 1:  ASTHMA (ICD-493.90) Assessment Unchanged  Moderate persistent asthma stable at this time plan: refill advair/proair return 6months  Medications  Added to Medication List This Visit: 1)  Proair Hfa 108 (90 Base) Mcg/act Aers (Albuterol sulfate) .... 2 puffs every 4-6 hours as needed  Complete Medication List: 1)  Lisinopril 20 Mg Tabs (Lisinopril) .... Once daily 2)  Advair Diskus 250-50 Mcg/dose Misc (Fluticasone-salmeterol) .... One puff twice daily 3)  Proair Hfa 108 (90 Base) Mcg/act Aers (Albuterol sulfate) .... 2 puffs every 4-6 hours as needed  Other Orders: Est. Patient Level III (62130)  Patient Instructions: 1)  No change in medications 2)  Return 12 months or sooner if needed Prescriptions: PROAIR HFA 108 (90 BASE) MCG/ACT AERS (ALBUTEROL SULFATE) 2 puffs every 4-6 hours as needed  #1 x 6   Entered and Authorized by:   Storm Frisk MD   Signed by:   Storm Frisk MD on 04/13/2009   Method used:   [facility]        [facility] [address] [phone]* (retail)       [address]       [place], [place]  [address]       Ph: [phone] or [phone]       Fax: [phone]   RxID:   [id number] ADVAIR DISKUS 250-50 MCG/DOSE  MISC (FLUTICASONE-SALMETEROL) One puff twice daily Brand medically necessary #1 x 6   Entered and Authorized by:   Storm Frisk MD   Signed by:   Storm Frisk MD on 04/13/2009   Method used:   Electronically to        [facility] [address] [phone]* (retail)       [address]       [place], [place]  [address]       Ph: [phone] or [phone]       Fax: [phone]   RxID:   [id number]

## 2010-03-12 NOTE — Progress Notes (Signed)
Summary: LMTCB-SAMPLES  Phone Note Call from Patient Call back at [phone]   Call For: [facility] Summary of Call: Patient is requesting samples of ProAir. She left vm for Dr Delford Field on primary care triage vm.  Initial call taken by: Lamar Sprinkles,  Jun 19, 2008 11:41 AM  Follow-up for Phone Call        Attempted TCB.  LMOM TCB.  No samples of Proair available at this time, no samples of relief inhalers period available. Follow-up by: Cloyde Reams RN,  Jun 19, 2008 12:44 PM  Additional Follow-up for Phone Call Additional follow up Details #1::        Pt. aware no samples of proair or any relief inhaler available at this time. Additional Follow-up by: Mea Ozga CMA,  Jun 19, 2008 12:54 PM

## 2010-03-12 NOTE — Assessment & Plan Note (Signed)
Summary: Pulmonary OV   Chief Complaint:  Asthma follow-up. Last seen 05/06/06. [name]  History of Present Illness: 50 yo AAF Moderate persistent asthma, VCD, GERD, obesity  February 02, 2008 12:40 PM Last OV 3/08. Since last ov pt doing well.  When gets temperature extreme gets dyspneic.  Occ sinus drainage.  Worse if off advair which is an issue when runs out of meds.  Uses proair as needed. Pt denies any significant sore throat, nasal congestion or excess secretions, fever, chills, sweats, unintended weight loss, pleurtic or exertional chest pain, orthopnea PND, or leg swelling Pt denies any increase in rescue therapy over baseline, denies waking up needing it or having any early am or nocturnal exacerbations of coughing/wheezing/or dyspnea.        Prior Medications Reviewed Using: Patient Recall  Updated Prior Medication List: LISINOPRIL 20 MG  TABS (LISINOPRIL) once daily ADVAIR DISKUS 250-50 MCG/DOSE  MISC (FLUTICASONE-SALMETEROL) 1 puff two times a day PROAIR HFA 108 (90 BASE) MCG/ACT  AERS (ALBUTEROL SULFATE) 2 puffs every 4 hrs as needed  Current Allergies (reviewed today): ! MORPHINE  Past Medical History:    Reviewed history from 09/14/2007 and no changes required:       GERD (ICD-530.81)       VOCAL CORD DISORDER (ICD-478.5)       OBESITY (ICD-278.00)       HYPERTENSION (ICD-401.9)       SINUSITIS (ICD-473.9)       ASTHMA (ICD-493.90)          -FeV1 68% 2008        alcohol abuse     Review of Systems  The patient denies anorexia, fever, hoarseness, chest pain, dyspnea on exertion, peripheral edema, prolonged cough, hemoptysis, severe indigestion/heartburn, abnormal bleeding, enlarged lymph nodes, and angioedema.     Vital Signs:  Patient Profile:   50 Years Old Female Height:     615 inches Weight:      271 pounds O2 Sat:      98 % O2 treatment:    Room Air Temp:     97.8 degrees F oral Pulse rate:   89 / minute BP sitting:   110 / 66  (left arm) Cuff  size:   large  Vitals Entered By: Michel Bickers CMA (February 02, 2008 12:01 PM)                 Physical Exam  General:     obese.   Head:     normocephalic and atraumatic Eyes:     PERRLA/EOM intact; conjunctiva and sclera clear Ears:     TMs intact and clear with normal canals Nose:     clear nasal discharge.   Mouth:     no deformity or lesions Neck:     no masses, thyromegaly, or abnormal cervical nodes Chest Wall:     no deformities noted Lungs:     clear bilaterally to auscultation and percussion Heart:     regular rate and rhythm, S1, S2 without murmurs, rubs, gallops, or clicks Abdomen:     bowel sounds positive; abdomen soft and non-tender without masses, or organomegaly Msk:     no deformity or scoliosis noted with normal posture Pulses:     pulses normal Extremities:     no clubbing, cyanosis, edema, or deformity noted Neurologic:     CN II-XII grossly intact with normal reflexes, coordination, muscle strength and tone Skin:     intact without lesions or rashes Cervical Nodes:  no significant adenopathy Axillary Nodes:     no significant adenopathy Psych:     alert and cooperative; normal mood and affect; normal attention span and concentration      Impression & Recommendations:  Problem # 1:  ASTHMA (ICD-493.90) Moderate persistent asthma stable at this time plan: refill advair/proair return 6months  Medications Added to Medication List This Visit: 1)  Advair Diskus 250-50 Mcg/dose Misc (Fluticasone-salmeterol) .[name].. 1 puff two times a day 2)  Advair Diskus 250-50 Mcg/dose Misc (Fluticasone-salmeterol) .... One puff twice daily 3)  Proair Hfa 108 (90 Base) Mcg/act Aers (Albuterol sulfate) .... 2 puffs every 4 hrs as needed  Complete Medication List: 1)  Lisinopril 20 Mg Tabs (Lisinopril) .... Once daily 2)  Advair Diskus 250-50 Mcg/dose Misc (Fluticasone-salmeterol) .... One puff twice daily 3)  Proair Hfa 108 (90 Base) Mcg/act Aers  (Albuterol sulfate) .... 2 puffs every 4 hrs as needed   Patient Instructions: 1)  No change in medications 2)  Return 6 months or as needed   Prescriptions: PROAIR HFA 108 (90 BASE) MCG/ACT  AERS (ALBUTEROL SULFATE) 2 puffs every 4 hrs as needed Brand medically necessary #1 x 6   Entered and Authorized by:   Storm Frisk MD   Signed by:   Storm Frisk MD on 02/02/2008   Method used:   Electronically to        [facility] [address] [phone]* (retail)       [address]       [place], [place]  [address]       Ph: [phone] or [phone]       Fax: [phone]   RxID:   [id number] ADVAIR DISKUS 250-50 MCG/DOSE  MISC (FLUTICASONE-SALMETEROL) One puff twice daily Brand medically necessary #1 x 6   Entered and Authorized by:   Storm Frisk MD   Signed by:   Storm Frisk MD on 02/02/2008   Method used:   Electronically to        [facility] [address] [phone]* (retail)       [address]       [place], [place]  [address]       Ph: [phone] or [phone]       Fax: [phone]   RxID:   [id number]  ]

## 2010-03-12 NOTE — Progress Notes (Signed)
Summary: NEED SAMPLES  Phone Note Call from Patient   Caller: Patient Call For: [name] Summary of Call: NEED SAMPLES OF ADVAIR AND PROAIR PATIENT'S CHART HAS BEEN REQUESTED  Initial call taken by: Rickard Patience,  April 05, 2007 3:09 PM  Follow-up for Phone Call        Samples x 1 left at front. LM for pt. Follow-up by: Marinus Maw,  April 05, 2007 4:10 PM

## 2010-03-14 ENCOUNTER — Emergency Department (HOSPITAL_COMMUNITY)
Admission: EM | Admit: 2010-03-14 | Discharge: 2010-03-14 | Disposition: A | Discharge status: Home / Self Care | Payer: PRIVATE HEALTH INSURANCE | Attending: Emergency Medicine | Admitting: Emergency Medicine

## 2010-03-14 DIAGNOSIS — R110 Nausea: Secondary | ICD-10-CM | POA: Insufficient documentation

## 2010-03-14 DIAGNOSIS — J45909 Unspecified asthma, uncomplicated: Secondary | ICD-10-CM | POA: Insufficient documentation

## 2010-03-14 DIAGNOSIS — Z79899 Other long term (current) drug therapy: Secondary | ICD-10-CM | POA: Insufficient documentation

## 2010-03-14 DIAGNOSIS — M129 Arthropathy, unspecified: Secondary | ICD-10-CM | POA: Insufficient documentation

## 2010-03-14 DIAGNOSIS — K219 Gastro-esophageal reflux disease without esophagitis: Secondary | ICD-10-CM | POA: Insufficient documentation

## 2010-03-14 DIAGNOSIS — R10816 Epigastric abdominal tenderness: Secondary | ICD-10-CM | POA: Insufficient documentation

## 2010-03-14 DIAGNOSIS — Z9889 Other specified postprocedural states: Secondary | ICD-10-CM | POA: Insufficient documentation

## 2010-03-14 DIAGNOSIS — R1013 Epigastric pain: Secondary | ICD-10-CM | POA: Insufficient documentation

## 2010-03-14 DIAGNOSIS — K5900 Constipation, unspecified: Secondary | ICD-10-CM | POA: Insufficient documentation

## 2010-03-14 DIAGNOSIS — I10 Essential (primary) hypertension: Secondary | ICD-10-CM | POA: Insufficient documentation

## 2010-03-14 LAB — CBC
HCT: 42.9 % (ref 36.0–46.0)
Hemoglobin: 14 g/dL (ref 12.0–15.0)
MCH: 28.6 pg (ref 26.0–34.0)
MCHC: 32.6 g/dL (ref 30.0–36.0)
MCV: 87.6 fL (ref 78.0–100.0)
Platelets: 241 10*3/uL (ref 150–400)
RBC: 4.9 MIL/uL (ref 3.87–5.11)
RDW: 13.8 % (ref 11.5–15.5)
WBC: 9.6 10*3/uL (ref 4.0–10.5)

## 2010-03-14 LAB — COMPREHENSIVE METABOLIC PANEL
ALT: 26 U/L (ref 0–35)
AST: 34 U/L (ref 0–37)
Albumin: 3.5 g/dL (ref 3.5–5.2)
Alkaline Phosphatase: 70 U/L (ref 39–117)
BUN: 16 mg/dL (ref 6–23)
CO2: 27 mEq/L (ref 19–32)
Calcium: 9.2 mg/dL (ref 8.4–10.5)
Chloride: 102 mEq/L (ref 96–112)
Creatinine, Ser: 0.93 mg/dL (ref 0.4–1.2)
GFR calc Af Amer: >60 mL/min (ref >60)
GFR calc non Af Amer: >60 mL/min (ref >60)
Glucose, Bld: 91 mg/dL (ref 70–99)
Potassium: 3.3 mEq/L — ABNORMAL LOW (ref 3.5–5.1)
Sodium: 137 mEq/L (ref 135–145)
Total Bilirubin: 0.6 mg/dL (ref 0.3–1.2)
Total Protein: 7 g/dL (ref 6.0–8.3)

## 2010-03-14 LAB — URINALYSIS, ROUTINE W REFLEX MICROSCOPIC
Bilirubin Urine: NEGATIVE
Hgb urine dipstick: NEGATIVE
Ketones, ur: 15 mg/dL — AB
Nitrite: NEGATIVE
Protein, ur: NEGATIVE mg/dL
Specific Gravity, Urine: 1.024 (ref 1.005–1.030)
Urine Glucose, Fasting: NEGATIVE mg/dL
Urobilinogen, UA: 0.2 mg/dL (ref 0.0–1.0)
pH: 5.5 (ref 5.0–8.0)

## 2010-03-14 LAB — DIFFERENTIAL
Basophils Absolute: 0 10*3/uL (ref 0.0–0.1)
Basophils Relative: 0 % (ref 0–1)
Eosinophils Absolute: 0.2 10*3/uL (ref 0.0–0.7)
Eosinophils Relative: 3 % (ref 0–5)
Lymphocytes Relative: 34 % (ref 12–46)
Lymphs Abs: 3.3 10*3/uL (ref 0.7–4.0)
Monocytes Absolute: 0.8 10*3/uL (ref 0.1–1.0)
Monocytes Relative: 8 % (ref 3–12)
Neutro Abs: 5.3 10*3/uL (ref 1.7–7.7)
Neutrophils Relative %: 55 % (ref 43–77)

## 2010-03-14 LAB — LIPASE, BLOOD: Lipase: 22 U/L (ref 11–59)

## 2010-03-14 NOTE — Progress Notes (Signed)
Summary: sinus infection  Phone Note Call from Patient Call back at [phone]   Caller: Patient Call For: wright Reason for Call: Talk to Nurse Summary of Call: Patient calling saying she has a sinus infection.  Headache, runny nose, sinus pressure.  Requesting appt.  Asked patient to call primary care doctor and she said that her primary doctor has closed her office. Initial call taken by: Lehman Prom,  January 23, 2010 11:42 AM  Follow-up for Phone Call        Spoke with pt and she staets that sh eis having head congestion, headache, dry cough and pnd x 3 days. She denies any SOB or chest congestion at this time, but states when she gets sinus congestion it always go to her chest. Pt requesting an abx, and cough syrup.Marguerite Olea estates she has an appt to see PW in 2 mths for follow-up. Please advise. Carron Curie CMA  January 23, 2010 2:31 PM [facility]  Additional Follow-up for Phone Call Additional follow up Details #1::        pt walked in wanting appt for above symptoms--per dr Delford Field, pt added to the schedule for today Additional Follow-up by: Johna Kearl CMA,  January 23, 2010 4:05 PM

## 2010-03-14 NOTE — Progress Notes (Signed)
Summary: samples  Phone Note Call from Patient Call back at Home Phone [phone]   Caller: Patient Call For: Artur Winningham Summary of Call: proair  wants a sample Initial call taken by: Lacinda Axon,  February 14, 2010 12:47 PM  Follow-up for Phone Call        lmomtcb x 1. Have Proventil HFA available for pick up. Zackery Barefoot CMA  February 14, 2010 3:41 PM   Additional Follow-up for Phone Call Additional follow up Details #1::        1 box of Proventil left at front desk. pt informed. Zackery Barefoot CMA  February 14, 2010 4:55 PM

## 2010-03-14 NOTE — Assessment & Plan Note (Signed)
Summary: Acute Pulmonary OV   Primary Provider/Referring Provider:  Eddie Candle MD  CC:  Acute Visit.  c/o HA, runny nose, burning sensation in nose, wheezing x 3 days.  Denies cough, increased SOB, and chest tightness.[name]  History of Present Illness: 50 yobf minimal  smoker quit in her 20's  in  Moderate persistent asthma, VCD, GERD, obesity    April 13, 2009  Over the past year ok The pt  is on ace inhibitor and has heart palpitations but denies throat irritation or clearing of throat. no real cough The advair has helped the pt The pt does not regularly use the saba.   July 20, 2009 cc cough started x 2 weeks ago tends to be yellow in am. baseline  = sob with anything more than slow adls and does use the saba twice daily but not typically at night.  now acute worse with severe cough to point of chest discomfort generalized during coughing fits but worse post aspect on right and having palpitations on lisinopril that she says has hztz in it (our records say no).  Sputum is slt discolored. Pt denies any significant sore throat, dysphagia, itching, sneezing,  nasal congestion or excess secretions,  fever, chills, sweats, unintended wt loss, laterallizing pleuritic or exertional cp, hempoptysis, change in activity tolerance  orthopnea pnd or leg swelling. Pt also denies any obvious fluctuation in symptoms with weather or environmental change or other alleviating or aggravating factors x worse cough and sob when working chronically (attributes to bad air there). January 23, 2010 4:30 PM The pt notes a headache for three days, nose burning,   There is mucus out of the nose that is yellow, there is sinus pressure. There is increase cough and soreness in throat.    Asthma History    Asthma Control Assessment:    Age range: 12+ years    Symptoms: throughout the day    Nighttime Awakenings: 0-2/month    Interferes w/ normal activity: some limitations    SABA use (not for EIB): >2 days/week   ATAQ questionnaire: 1-2    Exacerbations requiring oral systemic steroids: 0-1/year    Asthma Control Assessment: Very Poorly Controlled   Current Medications (verified): 1)  Advair Diskus 250-50 Mcg/dose  Misc (Fluticasone-Salmeterol) .... One Puff Twice Daily 2)  Losartan Potassium-Hctz 100-25 Mg Tabs (Losartan Potassium-Hctz) .... Take 1 Tablet By Mouth Once A Day 3)  Proair Hfa 108 (90 Base) Mcg/act Aers (Albuterol Sulfate) .... 2 Puffs Every 4-6 Hours As Needed 4)  Amlodipine Besylate 5 Mg Tabs (Amlodipine Besylate) .... Take 1 Tablet By Mouth Once A Day 5)  Ibuprofen 800 Mg Tabs (Ibuprofen) .... Take 1 Tablet By Mouth Two Times A Day  Allergies (verified): 1)  ! Morphine  Past History:  Past medical, surgical, family and social histories (including risk factors) reviewed, and no changes noted (except as noted below).  Past Medical History: GERD (ICD-530.81) VOCAL CORD DISORDER (ICD-478.5) OBESITY (ICD-278.00) HYPERTENSION (ICD-401.9)    - Try off ace July 20, 2009 and on bystolic due to pseudoasthma and palpitations SINUSITIS (ICD-473.9) ASTHMA (ICD-493.90)    -FeV1 68% 2008  alcohol abuse DJD R knee   Family History: Reviewed history and no changes required.  Social History: Reviewed history from 07/20/2009 and no changes required. Occupation: packing work  Married Quit smoking in her 20's only smoked x 5 years Alcohol use-yes Drug use-no Regular exercise-no  Review of Systems       The patient complains  of shortness of breath with activity, productive cough, headaches, nasal congestion/difficulty breathing through nose, and change in color of mucus.  The patient denies shortness of breath at rest, non-productive cough, coughing up blood, chest pain, irregular heartbeats, acid heartburn, indigestion, loss of appetite, weight change, abdominal pain, difficulty swallowing, sore throat, tooth/dental problems, sneezing, itching, ear ache, anxiety, depression,  hand/feet swelling, joint stiffness or pain, rash, and fever.    Vital Signs:  Patient profile:   50 year old female Height:      61 inches Weight:      274 pounds BMI:     51.96 O2 Sat:      98 % on Room air Temp:     98.1 degrees F oral Pulse rate:   78 / minute BP sitting:   132 / 80  (left arm) Cuff size:   large  Vitals Entered By: Gweneth Dimitri RN (January 23, 2010 4:17 PM)  O2 Flow:  Room air CC: Acute Visit.  c/o HA, runny nose, burning sensation in nose, wheezing x 3 days.  Denies cough, increased SOB, chest tightness. Comments Medications reviewed with patient Daytime contact number verified with patient. Gweneth Dimitri RN  January 23, 2010 4:20 PM    Physical Exam  Additional Exam:  Gen: Pleasant, well-nourished, in no distress , normal affect ENT: erythema, purulent nares, edema in nares Neck: No JVD, no TMG, no carotid bruits Lungs: No use of accessory muscles, no dullness to percussion, clear without rales or rhonchi Cardiovascular: RRR, heart sounds normal, no murmurs or gallops, no peripheral edema Abdomen: soft and non-tender, no HSM, BS normal Musculoskeletal: No deformities, no cyanosis or clubbing Neuro: alert, non-focal     Impression & Recommendations:  Problem # 1:  SINUSITIS (ICD-473.9) Assessment Deteriorated acute sinusits with flare plan augmentin x 7days  pulse prednisone nasal hygiene  Medications Added to Medication List This Visit: 1)  Losartan Potassium-hctz 100-25 Mg Tabs (Losartan potassium-hctz) .... Take 1 tablet by mouth once a day 2)  Amlodipine Besylate 5 Mg Tabs (Amlodipine besylate) .... Take 1 tablet by mouth once a day 3)  Ibuprofen 800 Mg Tabs (Ibuprofen) .... Take 1 tablet by mouth two times a day 4)  Amoxicillin-pot Clavulanate 875-125 Mg Tabs (Amoxicillin-pot clavulanate) .... One by mouth two times a day 5)  Prednisone 10 Mg Tabs (Prednisone) .... Take as directed take 4 daily for two days, then 3 daily for two  days, then two daily for two days then one daily for two days then stop 6)  Fluticasone Propionate 50 Mcg/act Susp (Fluticasone propionate) .... Two sprays each nostril until cannister empty  Complete Medication List: 1)  Advair Diskus 250-50 Mcg/dose Misc (Fluticasone-salmeterol) .... One puff twice daily 2)  Losartan Potassium-hctz 100-25 Mg Tabs (Losartan potassium-hctz) .... Take 1 tablet by mouth once a day 3)  Proair Hfa 108 (90 Base) Mcg/act Aers (Albuterol sulfate) .... 2 puffs every 4-6 hours as needed 4)  Amlodipine Besylate 5 Mg Tabs (Amlodipine besylate) .... Take 1 tablet by mouth once a day 5)  Ibuprofen 800 Mg Tabs (Ibuprofen) .... Take 1 tablet by mouth two times a day 6)  Amoxicillin-pot Clavulanate 875-125 Mg Tabs (Amoxicillin-pot clavulanate) .... One by mouth two times a day 7)  Prednisone 10 Mg Tabs (Prednisone) .... Take as directed take 4 daily for two days, then 3 daily for two days, then two daily for two days then one daily for two days then stop 8)  Fluticasone Propionate 50 Mcg/act Susp (  Fluticasone propionate) .... Two sprays each nostril until cannister empty  Other Orders: Est. Patient Level IV ([address])  Patient Instructions: 1)  Augmentin one twice daily for 7days (generic) 2)  Prednisone 10mg  Take 4 daily for two days, then 3 daily for two days, then two daily for two days then one daily for two days then stop 3)  Flonase (generic) two sprays each nostril daily 4)  Return 4 months, sooner if unimproved Prescriptions: FLUTICASONE PROPIONATE 50 MCG/ACT SUSP (FLUTICASONE PROPIONATE) two sprays each nostril until cannister empty  #1 x 0   Entered and Authorized by:   Storm Frisk MD   Signed by:   Storm Frisk MD on 01/23/2010   Method used:   [facility]        [facility] [address] [phone]* (retail)       [address]       [place], [place]  [address]       Ph: [phone] or [phone]       Fax: [phone]   RxID:    [id number] PREDNISONE 10 MG  TABS (PREDNISONE) Take as directed Take 4 daily for two days, then 3 daily for two days, then two daily for two days then one daily for two days then stop  #20 x 0   Entered and Authorized by:   Storm Frisk MD   Signed by:   Storm Frisk MD on 01/23/2010   Method used:   [facility]        [facility] [address] [phone]* (retail)       [address]       [place], [place]  [address]       Ph: [phone] or [phone]       Fax: [phone]   RxID:   [id number] AMOXICILLIN-POT CLAVULANATE 875-125 MG TABS (AMOXICILLIN-POT CLAVULANATE) one by mouth two times a day  #14 x 0   Entered and Authorized by:   Storm Frisk MD   Signed by:   Storm Frisk MD on 01/23/2010   Method used:   [facility]        [facility] [address] [phone]* (retail)       [address]       [place], [place]  [address]       Ph: [phone] or [phone]       Fax: [phone]   RxID:   [id number]

## 2010-03-19 ENCOUNTER — Telehealth: Payer: Self-pay | Admitting: Internal Medicine

## 2010-03-20 ENCOUNTER — Encounter: Payer: Self-pay | Admitting: Internal Medicine

## 2010-03-28 NOTE — Progress Notes (Signed)
Summary: referral  Phone Note Call from Patient Call back at [phone]   Caller: Patient Reason for Call: Referral Summary of Call: Left msg on triage stating have new pt appt schedule for 04/05/10. Want to know if she can get referral to see GI specialist Initial call taken by: Orlan Leavens RMA,  March 19, 2010 3:53 PM  Follow-up for Phone Call        called pt back let her know she has to see md first before referral can be put in. Pt states ok has to wait until 24th because she coming in for CPX Follow-up by: Orlan Leavens RMA,  March 19, 2010 4:10 PM

## 2010-03-29 ENCOUNTER — Encounter (INDEPENDENT_AMBULATORY_CARE_PROVIDER_SITE_OTHER): Payer: Self-pay | Admitting: *Deleted

## 2010-03-29 ENCOUNTER — Other Ambulatory Visit: Payer: No Typology Code available for payment source

## 2010-03-29 ENCOUNTER — Other Ambulatory Visit: Payer: Self-pay | Admitting: Family Medicine

## 2010-03-29 DIAGNOSIS — E785 Hyperlipidemia, unspecified: Secondary | ICD-10-CM

## 2010-03-29 DIAGNOSIS — Z0000 Encounter for general adult medical examination without abnormal findings: Secondary | ICD-10-CM

## 2010-03-29 LAB — HEPATIC FUNCTION PANEL
ALT: 34 U/L (ref 0–35)
AST: 35 U/L (ref 0–37)
Albumin: 3.4 g/dL — ABNORMAL LOW (ref 3.5–5.2)
Alkaline Phosphatase: 74 U/L (ref 39–117)
Bilirubin, Direct: 0.1 mg/dL (ref 0.0–0.3)
Total Bilirubin: 0.7 mg/dL (ref 0.3–1.2)
Total Protein: 6.5 g/dL (ref 6.0–8.3)

## 2010-03-29 LAB — CBC WITH DIFFERENTIAL/PLATELET
Basophils Absolute: 0 10*3/uL (ref 0.0–0.1)
Basophils Relative: 0.2 % (ref 0.0–3.0)
Eosinophils Absolute: 0.2 10*3/uL (ref 0.0–0.7)
Eosinophils Relative: 1.6 % (ref 0.0–5.0)
HCT: 42.9 % (ref 36.0–46.0)
Hemoglobin: 14.3 g/dL (ref 12.0–15.0)
Lymphocytes Relative: 21.3 % (ref 12.0–46.0)
Lymphs Abs: 2.9 10*3/uL (ref 0.7–4.0)
MCHC: 33.4 g/dL (ref 30.0–36.0)
MCV: 91.3 fl (ref 78.0–100.0)
Monocytes Absolute: 0.9 10*3/uL (ref 0.1–1.0)
Monocytes Relative: 6.8 % (ref 3.0–12.0)
Neutro Abs: 9.6 10*3/uL — ABNORMAL HIGH (ref 1.4–7.7)
Neutrophils Relative %: 70.1 % (ref 43.0–77.0)
Platelets: 260 10*3/uL (ref 150.0–400.0)
RBC: 4.7 Mil/uL (ref 3.87–5.11)
RDW: 14.6 % (ref 11.5–14.6)
WBC: 13.6 10*3/uL — ABNORMAL HIGH (ref 4.5–10.5)

## 2010-03-29 LAB — URINALYSIS
Bilirubin Urine: NEGATIVE
Hgb urine dipstick: NEGATIVE
Ketones, ur: NEGATIVE
Leukocytes, UA: NEGATIVE
Nitrite: NEGATIVE
Specific Gravity, Urine: 1.015 (ref 1.000–1.030)
Total Protein, Urine: NEGATIVE
Urine Glucose: NEGATIVE
Urobilinogen, UA: 0.2 (ref 0.0–1.0)
pH: 7 (ref 5.0–8.0)

## 2010-03-29 LAB — BASIC METABOLIC PANEL
BUN: 24 mg/dL — ABNORMAL HIGH (ref 6–23)
CO2: 29 mEq/L (ref 19–32)
Calcium: 9.2 mg/dL (ref 8.4–10.5)
Chloride: 104 mEq/L (ref 96–112)
Creatinine, Ser: 0.8 mg/dL (ref 0.4–1.2)
GFR: 99.48 mL/min (ref >60.00)
Glucose, Bld: 92 mg/dL (ref 70–99)
Potassium: 4.2 mEq/L (ref 3.5–5.1)
Sodium: 139 mEq/L (ref 135–145)

## 2010-03-29 LAB — LIPID PANEL
Cholesterol: 211 mg/dL — ABNORMAL HIGH (ref 0–200)
HDL: 81 mg/dL (ref >39.00)
Total CHOL/HDL Ratio: 3
Triglycerides: 32 mg/dL (ref 0.0–149.0)
VLDL: 6.4 mg/dL (ref 0.0–40.0)

## 2010-03-29 LAB — LDL CHOLESTEROL, DIRECT: Direct LDL: 118.6 mg/dL

## 2010-03-29 LAB — TSH: TSH: 0.48 u[IU]/mL (ref 0.35–5.50)

## 2010-04-03 NOTE — Letter (Signed)
Summary: [facility]   Imported By: Sherian Rein 03/25/2010 12:23:20  _____________________________________________________________________  External Attachment:    Type:   Image     Comment:   External Document

## 2010-04-05 ENCOUNTER — Encounter: Payer: Self-pay | Admitting: Internal Medicine

## 2010-04-05 ENCOUNTER — Ambulatory Visit (INDEPENDENT_AMBULATORY_CARE_PROVIDER_SITE_OTHER): Payer: PRIVATE HEALTH INSURANCE | Admitting: Internal Medicine

## 2010-04-05 DIAGNOSIS — Z860100 Personal history of colon polyps, unspecified: Secondary | ICD-10-CM

## 2010-04-05 DIAGNOSIS — F411 Generalized anxiety disorder: Secondary | ICD-10-CM | POA: Insufficient documentation

## 2010-04-05 DIAGNOSIS — Z0000 Encounter for general adult medical examination without abnormal findings: Secondary | ICD-10-CM

## 2010-04-05 DIAGNOSIS — Z8719 Personal history of other diseases of the digestive system: Secondary | ICD-10-CM

## 2010-04-05 HISTORY — DX: Personal history of colonic polyps: Z86.010

## 2010-04-05 HISTORY — DX: Personal history of colon polyps, unspecified: Z86.0100

## 2010-04-05 HISTORY — DX: Personal history of other diseases of the digestive system: Z87.19

## 2010-04-09 NOTE — Assessment & Plan Note (Signed)
Summary: NEW CPX/[facility]/#/cd   Vital Signs:  Patient profile:   50 year old female Height:      62 inches Weight:      260.75 pounds BMI:     47.86 O2 Sat:      98 % on Room air Temp:     98.6 degrees F oral Pulse rate:   80 / minute BP sitting:   120 / 86  (left arm) Cuff size:   large  Vitals Entered By: Zella Ball Ewing CMA Duncan Dull) (April 05, 2010 9:32 AM)  O2 Flow:  Room air  Preventive Care Screening  Colonoscopy:    Date:  02/10/2006    Next Due:  02/2011    Results:  abnormal   Mammogram:    Date:  04/09/2009    Results:  normal   Pap Smear:    Date:  04/07/2008    Results:  normal   Last Tetanus Booster:    Date:  02/10/2002    Results:  Tdap   CC: Adult Physical/RE   Primary Care Provider:  Eddie Candle MD  CC:  Adult Physical/RE.  History of Present Illness: here for wellness as new pt;  former MD moved;  Pt denies CP, worsening sob, doe, wheezing, orthopnea, pnd, worsening LE edema, palps, dizziness or syncope  Pt denies new neuro symptoms such as headache, facial or extremity weakness  Pt denies polydipsia, polyuria, or low sugar symptoms such as shakiness improved with eating.  Overall good compliance with meds, trying to follow low chol, DM diet, wt stable, little excercise however No fever, wt loss, night sweats, loss of appetite or other constitutional symptoms  Overall good compliance with meds, and good tolerability.  Denies worsening depressive symptoms, suicidal ideation, or panic.   Pt states good ability with ADL's, low fall risk, home safety reviewed and adequate, no significant change in hearing or vision, trying to follow lower chol diet, and occasionally active only with regular excercise.  Did see Gi recnetly, and reflux symtpoms resolved on PPI, had lost about 12 lbs with more active symtpoms, now tyring not to gain it back.  Problems Prior to Update: 1)  Chest Pain  (ICD-786.50) 2)  Palpitations, Hx of  (ICD-V12.50) 3)  Gerd   (ICD-530.81) 4)  Vocal Cord Disorder  (ICD-478.5) 5)  Obesity  (ICD-278.00) 6)  Hypertension  (ICD-401.9) 7)  Sinusitis  (ICD-473.9) 8)  Asthma  (ICD-493.90)  Medications Prior to Update: 1)  Advair Diskus 250-50 Mcg/dose  Misc (Fluticasone-Salmeterol) .... One Puff Twice Daily 2)  Losartan Potassium-Hctz 100-25 Mg Tabs (Losartan Potassium-Hctz) .... Take 1 Tablet By Mouth Once A Day 3)  Proair Hfa 108 (90 Base) Mcg/act Aers (Albuterol Sulfate) .... 2 Puffs Every 4-6 Hours As Needed 4)  Amlodipine Besylate 5 Mg Tabs (Amlodipine Besylate) .... Take 1 Tablet By Mouth Once A Day 5)  Ibuprofen 800 Mg Tabs (Ibuprofen) .... Take 1 Tablet By Mouth Two Times A Day 6)  Fluticasone Propionate 50 Mcg/act Susp (Fluticasone Propionate) .... Two Sprays Each Nostril Until Cannister Empty  Current Medications (verified): 1)  Advair Diskus 250-50 Mcg/dose  Misc (Fluticasone-Salmeterol) .... One Puff Twice Daily 2)  Losartan Potassium-Hctz 100-25 Mg Tabs (Losartan Potassium-Hctz) .... Take 1 Tablet By Mouth Once A Day 3)  Proair Hfa 108 (90 Base) Mcg/act Aers (Albuterol Sulfate) .... 2 Puffs Every 4-6 Hours As Needed 4)  Amlodipine Besylate 5 Mg Tabs (Amlodipine Besylate) .... Take 1 Tablet By Mouth Once A Day 5)  Fluticasone Propionate 50 Mcg/act Susp (Fluticasone Propionate) .... Two Sprays Each Nostril Until Cannister Empty 6)  Celebrex 200 Mg Caps (Celecoxib) .Marland Kitchen.. 1 By Mouth Once Daily 7)  Prevacid 30 Mg Cpdr (Lansoprazole) .Marland Kitchen.. 1 By Mouth Once Daily 8)  Lorazepam 1 Mg Tabs (Lorazepam) .Marland Kitchen.. 1 By Mouth Once Daily As Needed  Allergies (verified): 1)  ! Morphine  Past History:  Family History: Last updated: 04/05/2010 sister with lymphoma sister with substance abuse aunt with DM. pacemaker mother , grandmother and uncle with stroke  Social History: Last updated: 04/05/2010 Occupation: packing work  Married Quit smoking in her 20's only smoked x 5 years Alcohol use-yes Drug use-no Regular  exercise-no 1 child  Risk Factors: Exercise: no (09/14/2007)  Risk Factors: Smoking Status: never (04/13/2009)  Past Medical History: GERD (ICD-530.81) VOCAL CORD DISORDER (ICD-478.5) OBESITY (ICD-278.00) HYPERTENSION (ICD-401.9)    - Try off ace July 20, 2009 and on bystolic due to pseudoasthma and palpitations SINUSITIS (ICD-473.9) ASTHMA (ICD-493.90)    -FeV1 68% 2008  alcohol abuse DJD R knee  - mod to severe Anxiety/panic GERD Colonic polyps, hx of - Dr Hung/GI Diverticulitis, hx of - 3 episodes before 2012  Past Surgical History: Hysterectomy  Family History: Reviewed history and no changes required. sister with lymphoma sister with substance abuse aunt with DM. pacemaker mother , grandmother and uncle with stroke  Social History: Reviewed history from 01/23/2010 and no changes required. Occupation: packing work  Married Quit smoking in her 20's only smoked x 5 years Alcohol use-yes Drug use-no Regular exercise-no 1 child  Review of Systems  The patient denies anorexia, fever, weight gain, vision loss, decreased hearing, hoarseness, chest pain, syncope, dyspnea on exertion, peripheral edema, prolonged cough, headaches, hemoptysis, abdominal pain, melena, hematochezia, severe indigestion/heartburn, hematuria, muscle weakness, suspicious skin lesions, transient blindness, difficulty walking, depression, unusual weight change, abnormal bleeding, enlarged lymph nodes, and angioedema.         all otherwise negative per pt -    Physical Exam  General:  alert and overweight-appearing.   Head:  normocephalic and atraumatic.   Eyes:  vision grossly intact and pupils equal.   Ears:  R ear normal and L ear normal.   Nose:  no external deformity and no nasal discharge.   Mouth:  no gingival abnormalities and pharynx pink and moist.   Neck:  supple and no masses.   Lungs:  normal respiratory effort and normal breath sounds.   Heart:  normal rate and regular  rhythm.   Abdomen:  soft, non-tender, and normal bowel sounds.   Msk:  no joint tenderness and no joint swelling.   Extremities:  no edema, no erythema  Neurologic:  strength normal in all extremities and gait normal.   Skin:  color normal and no rashes.   Psych:  not anxious appearing and not depressed appearing.     Impression & Recommendations:  Problem # 1:  Preventive Health Care (ICD-V70.0) Overall doing well, age appropriate education and counseling updated, referral for preventive services and immunizations addressed, dietary counseling and smoking status adressed , most recent labs reviewed I have personally reviewed and have noted 1.The patient's medical and social history 2.Their use of alcohol, tobacco or illicit drugs 3.Their current medications and supplements 4. Functional ability including ADL's, fall risk, home safety risk, hearing & visual impairment 5.Diet and physical activities 6.Evidence for depression or mood disorders The patients weight, height, BMI  have been recorded in the chart I have made referrals, counseling  and provided education to the patient based review of the above   Complete Medication List: 1)  Advair Diskus 250-50 Mcg/dose Misc (Fluticasone-salmeterol) .... One puff twice daily 2)  Losartan Potassium-hctz 100-25 Mg Tabs (Losartan potassium-hctz) .... Take 1 tablet by mouth once a day 3)  Proair Hfa 108 (90 Base) Mcg/act Aers (Albuterol sulfate) .... 2 puffs every 4-6 hours as needed 4)  Amlodipine Besylate 5 Mg Tabs (Amlodipine besylate) .... Take 1 tablet by mouth once a day 5)  Fluticasone Propionate 50 Mcg/act Susp (Fluticasone propionate) .... Two sprays each nostril until cannister empty 6)  Celebrex 200 Mg Caps (Celecoxib) .Marland Kitchen.. 1 by mouth once daily 7)  Prevacid 30 Mg Cpdr (Lansoprazole) .Marland Kitchen.. 1 by mouth once daily 8)  Lorazepam 1 Mg Tabs (Lorazepam) .Marland Kitchen.. 1 by mouth once daily as needed  Patient Instructions: 1)  Continue all previous  medications as before this visit 2)  Please follow low cholesterol diet  3)  Please schedule a follow-up appointment in 1 year.   Orders Added: 1)  New Patient 40-64 years [phone]

## 2010-04-17 ENCOUNTER — Telehealth (INDEPENDENT_AMBULATORY_CARE_PROVIDER_SITE_OTHER): Payer: Self-pay | Admitting: *Deleted

## 2010-04-18 NOTE — Letter (Signed)
[facility]: [facility]   Imported By: Lester Fords 04/09/2010 07:34:17  _____________________________________________________________________  External Attachment:    Type:   Image     Comment:   External Document

## 2010-04-23 NOTE — Progress Notes (Signed)
Summary: would like samples of ProAir and Advir  Phone Note Call from Patient   Caller: Patient Call For: [name] Summary of Call: patient phoned and would like to know if she could get some samples of ProAir and Advir. Patient can be reached at [phone] or [phone] Initial call taken by: Vedia Coffer,  April 17, 2010 4:57 PM  Follow-up for Phone Call        1 sample advair 250/50 left at front.  No samples of proair availabe.  Pt aware.  Advair Lot # [id number], Exp Date 03/2011.  Follow-up by: Gweneth Dimitri RN,  April 17, 2010 5:13 PM

## 2010-04-29 LAB — POCT CARDIAC MARKERS
CKMB, poc: 7.5 ng/mL (ref 1.0–8.0)
CKMB, poc: 9.3 ng/mL (ref 1.0–8.0)
Myoglobin, poc: 115 ng/mL (ref 12–200)
Myoglobin, poc: 183 ng/mL (ref 12–200)
Troponin i, poc: <0.05 ng/mL (ref 0.00–0.09)
Troponin i, poc: <0.05 ng/mL (ref 0.00–0.09)

## 2010-04-29 LAB — CBC
HCT: 42.2 % (ref 36.0–46.0)
Hemoglobin: 14.3 g/dL (ref 12.0–15.0)
MCHC: 33.9 g/dL (ref 30.0–36.0)
MCV: 89.6 fL (ref 78.0–100.0)
Platelets: 187 10*3/uL (ref 150–400)
RBC: 4.72 MIL/uL (ref 3.87–5.11)
RDW: 14 % (ref 11.5–15.5)
WBC: 11.4 10*3/uL — ABNORMAL HIGH (ref 4.0–10.5)

## 2010-04-29 LAB — DIFFERENTIAL
Basophils Absolute: 0.1 10*3/uL (ref 0.0–0.1)
Basophils Relative: 1 % (ref 0–1)
Eosinophils Absolute: 0.2 10*3/uL (ref 0.0–0.7)
Eosinophils Relative: 2 % (ref 0–5)
Lymphocytes Relative: 24 % (ref 12–46)
Lymphs Abs: 2.7 10*3/uL (ref 0.7–4.0)
Monocytes Absolute: 0.6 10*3/uL (ref 0.1–1.0)
Monocytes Relative: 6 % (ref 3–12)
Neutro Abs: 7.8 10*3/uL — ABNORMAL HIGH (ref 1.7–7.7)
Neutrophils Relative %: 68 % (ref 43–77)

## 2010-04-29 LAB — BASIC METABOLIC PANEL
BUN: 12 mg/dL (ref 6–23)
CO2: 26 mEq/L (ref 19–32)
Calcium: 9.1 mg/dL (ref 8.4–10.5)
Chloride: 106 mEq/L (ref 96–112)
Creatinine, Ser: 0.78 mg/dL (ref 0.4–1.2)
GFR calc Af Amer: >60 mL/min (ref >60)
GFR calc non Af Amer: >60 mL/min (ref >60)
Glucose, Bld: 127 mg/dL — ABNORMAL HIGH (ref 70–99)
Potassium: 3.4 mEq/L — ABNORMAL LOW (ref 3.5–5.1)
Sodium: 137 mEq/L (ref 135–145)

## 2010-04-29 LAB — D-DIMER, QUANTITATIVE: D-Dimer, Quant: 0.41 ug/mL-FEU (ref 0.00–0.48)

## 2010-04-29 LAB — CK TOTAL AND CKMB (NOT AT ARMC)
CK, MB: 10.4 ng/mL (ref 0.3–4.0)
Relative Index: 1.7 (ref 0.0–2.5)
Total CK: 601 U/L — ABNORMAL HIGH (ref 7–177)

## 2010-05-01 ENCOUNTER — Telehealth: Payer: Self-pay | Admitting: Critical Care Medicine

## 2010-05-01 ENCOUNTER — Encounter: Payer: Self-pay | Admitting: Critical Care Medicine

## 2010-05-01 LAB — DIFFERENTIAL
Basophils Absolute: 0.2 10*3/uL — ABNORMAL HIGH (ref 0.0–0.1)
Basophils Relative: 1 % (ref 0–1)
Eosinophils Absolute: 0.2 10*3/uL (ref 0.0–0.7)
Eosinophils Relative: 2 % (ref 0–5)
Lymphocytes Relative: 19 % (ref 12–46)
Lymphs Abs: 2.6 10*3/uL (ref 0.7–4.0)
Monocytes Absolute: 1 10*3/uL (ref 0.1–1.0)
Monocytes Relative: 7 % (ref 3–12)
Neutro Abs: 10.1 10*3/uL — ABNORMAL HIGH (ref 1.7–7.7)
Neutrophils Relative %: 71 % (ref 43–77)

## 2010-05-01 LAB — GC/CHLAMYDIA PROBE AMP, URINE
Chlamydia, Swab/Urine, PCR: NEGATIVE
GC Probe Amp, Urine: NEGATIVE

## 2010-05-01 LAB — WET PREP, GENITAL
Clue Cells Wet Prep HPF POC: NONE SEEN
Trich, Wet Prep: NONE SEEN
Yeast Wet Prep HPF POC: NONE SEEN

## 2010-05-01 LAB — URINALYSIS, ROUTINE W REFLEX MICROSCOPIC
Bilirubin Urine: NEGATIVE
Glucose, UA: NEGATIVE mg/dL
Hgb urine dipstick: NEGATIVE
Ketones, ur: NEGATIVE mg/dL
Nitrite: NEGATIVE
Protein, ur: NEGATIVE mg/dL
Specific Gravity, Urine: 1.029 (ref 1.005–1.030)
Urobilinogen, UA: 0.2 mg/dL (ref 0.0–1.0)
pH: 5.5 (ref 5.0–8.0)

## 2010-05-01 LAB — POCT I-STAT, CHEM 8
BUN: 15 mg/dL (ref 6–23)
Calcium, Ion: 1.21 mmol/L (ref 1.12–1.32)
Chloride: 106 mEq/L (ref 96–112)
Creatinine, Ser: 0.9 mg/dL (ref 0.4–1.2)
Glucose, Bld: 92 mg/dL (ref 70–99)
HCT: 47 % — ABNORMAL HIGH (ref 36.0–46.0)
Hemoglobin: 16 g/dL — ABNORMAL HIGH (ref 12.0–15.0)
Potassium: 3.7 mEq/L (ref 3.5–5.1)
Sodium: 142 mEq/L (ref 135–145)
TCO2: 29 mmol/L (ref 0–100)

## 2010-05-01 LAB — CBC
HCT: 42.9 % (ref 36.0–46.0)
Hemoglobin: 14.1 g/dL (ref 12.0–15.0)
MCHC: 32.9 g/dL (ref 30.0–36.0)
MCV: 90.4 fL (ref 78.0–100.0)
Platelets: 232 10*3/uL (ref 150–400)
RBC: 4.74 MIL/uL (ref 3.87–5.11)
RDW: 14.1 % (ref 11.5–15.5)
WBC: 14.1 10*3/uL — ABNORMAL HIGH (ref 4.0–10.5)

## 2010-05-01 LAB — HEMOCCULT GUIAC POC 1CARD (OFFICE): Fecal Occult Bld: POSITIVE

## 2010-05-01 LAB — URINE CULTURE: Colony Count: 2000

## 2010-05-01 LAB — GC/CHLAMYDIA PROBE AMP, GENITAL
Chlamydia, DNA Probe: NEGATIVE
GC Probe Amp, Genital: NEGATIVE

## 2010-05-01 NOTE — Telephone Encounter (Signed)
No samples of proair available but 1 sample of Ventolin left at front desk for pt to pick up.  She is aware of this.  Lot # [id number] Exp Date May 2013

## 2010-05-03 ENCOUNTER — Telehealth: Payer: Self-pay | Admitting: Critical Care Medicine

## 2010-05-03 NOTE — Telephone Encounter (Signed)
lmomtcb for pt 

## 2010-05-03 NOTE — Telephone Encounter (Signed)
Pt returned my call and stated that she has a dry cough that started yesterday and was last seen in December 2011---requesting a refill of tussionex.the patient also c/o of rattling in her chest.  Please advise. Thanks

## 2010-05-03 NOTE — Telephone Encounter (Signed)
Patient returned Leigh's call she can be reached at [phone].Vedia Coffer

## 2010-05-04 NOTE — Telephone Encounter (Signed)
This pt will need an OV with NP this week She has to be seen before meds are called in

## 2010-05-06 ENCOUNTER — Telehealth: Payer: Self-pay | Admitting: Critical Care Medicine

## 2010-05-06 LAB — POCT I-STAT, CHEM 8
BUN: 10 mg/dL (ref 6–23)
Calcium, Ion: 1.13 mmol/L (ref 1.12–1.32)
Chloride: 102 mEq/L (ref 96–112)
Creatinine, Ser: 1 mg/dL (ref 0.4–1.2)
Glucose, Bld: 94 mg/dL (ref 70–99)
HCT: 47 % — ABNORMAL HIGH (ref 36.0–46.0)
Hemoglobin: 16 g/dL — ABNORMAL HIGH (ref 12.0–15.0)
Potassium: 2.8 mEq/L — ABNORMAL LOW (ref 3.5–5.1)
Sodium: 139 mEq/L (ref 135–145)
TCO2: 30 mmol/L (ref 0–100)

## 2010-05-06 LAB — BASIC METABOLIC PANEL
BUN: 13 mg/dL (ref 6–23)
CO2: 27 mEq/L (ref 19–32)
Calcium: 9.4 mg/dL (ref 8.4–10.5)
Chloride: 111 mEq/L (ref 96–112)
Creatinine, Ser: 1.01 mg/dL (ref 0.4–1.2)
GFR calc Af Amer: >60 mL/min (ref >60)
GFR calc non Af Amer: 59 mL/min — ABNORMAL LOW (ref >60)
Glucose, Bld: 105 mg/dL — ABNORMAL HIGH (ref 70–99)
Potassium: 3.8 mEq/L (ref 3.5–5.1)
Sodium: 143 mEq/L (ref 135–145)

## 2010-05-06 NOTE — Telephone Encounter (Signed)
noted 

## 2010-05-06 NOTE — Telephone Encounter (Signed)
PATIENT CALLED STATED THAT SHE CALLED LAST WEEK REGARDING A PRESCRIPTION BUT DID NOT RECEIVE A RETURN CALL I LOOKED UP THE OLD PHONE NOTE AND IT STATED

## 2010-05-06 NOTE — Telephone Encounter (Signed)
THAT PATIENT WOULD NEED TO BE SEEN BUT THE PATIENT STATED THAT NO ONE CALLED HER BACK REGARDING THIS. I OFFERED HER AN APPT BUT SHE DECLINED STATED THAT SHE HAS SEVERAL OTHER DOCTOR APPTS THIS WEEK AND SHE WILL CALL BACK IF THE COUGH DOESN'T GET ANY BETTER.[name]

## 2010-05-06 NOTE — Telephone Encounter (Signed)
Will forward to Dr. Delford Field to make him aware pt refused apt.  Carver Fila, CMA

## 2010-05-07 ENCOUNTER — Telehealth: Payer: Self-pay | Admitting: Critical Care Medicine

## 2010-05-07 NOTE — Telephone Encounter (Signed)
Pt returning call

## 2010-05-07 NOTE — Telephone Encounter (Signed)
Spoke with pt and she c/o very congested, non productive cough, some wheezing, 99.1 fever yesterday. Pt is coming i to see pw at 11:45  Carver Fila, CMA

## 2010-05-08 ENCOUNTER — Encounter: Payer: Self-pay | Admitting: Critical Care Medicine

## 2010-05-08 ENCOUNTER — Ambulatory Visit (INDEPENDENT_AMBULATORY_CARE_PROVIDER_SITE_OTHER): Payer: PRIVATE HEALTH INSURANCE | Admitting: Critical Care Medicine

## 2010-05-08 VITALS — BP 130/90 | HR 89 | Temp 97.1°F | Ht 61.0 in | Wt 261.0 lb

## 2010-05-08 DIAGNOSIS — K219 Gastro-esophageal reflux disease without esophagitis: Secondary | ICD-10-CM

## 2010-05-08 DIAGNOSIS — J45909 Unspecified asthma, uncomplicated: Secondary | ICD-10-CM

## 2010-05-08 MED ORDER — FLUTICASONE PROPIONATE 50 MCG/ACT NA SUSP: 2.0000 | Freq: Every day | NASAL | Status: DC

## 2010-05-08 MED ORDER — PREDNISONE 10 MG PO TABS
10.0000 mg | ORAL_TABLET | Freq: Every day | ORAL | Status: AC
Start: 2010-05-08 — End: 2010-05-16

## 2010-05-08 MED ORDER — FEXOFENADINE HCL 180 MG PO TABS: 180.0000 mg | ORAL_TABLET | Freq: Every day | ORAL | Status: DC

## 2010-05-08 MED ORDER — FLUTICASONE FUROATE 27.5 MCG/SPRAY NA SUSP: 2.0000 | Freq: Every day | NASAL | Status: DC

## 2010-05-08 NOTE — Telephone Encounter (Signed)
Patient phoned stated that she was returning a call to the nurse. [name]

## 2010-05-08 NOTE — Progress Notes (Signed)
  Subjective:    Patient ID: [name], female    DOB: 1960-06-23, 50 y.o.   MRN: [id number]  Cough This is a new problem. The current episode started in the past 7 days. The problem has been gradually worsening. The problem occurs hourly (worse at night). The cough is productive of sputum (mucus is yellow, was clear ). Associated symptoms include chest pain, heartburn, nasal congestion, postnasal drip, a sore throat, shortness of breath and wheezing. Pertinent negatives include no chills, fever, headaches, hemoptysis, myalgias, rash or rhinorrhea. Associated symptoms comments: Throat feels dry and , worse at night . The symptoms are aggravated by pollens, cold air and lying down. She has tried OTC cough suppressant for the symptoms. The treatment provided mild relief. Her past medical history is significant for asthma and environmental allergies.  Shortness of Breath This is a new problem. The current episode started in the past 7 days. The problem occurs daily. The problem has been waxing and waning. Associated symptoms include chest pain, a sore throat, sputum production and wheezing. Pertinent negatives include no fever, headaches, hemoptysis, leg swelling, orthopnea, PND, rash, rhinorrhea, swollen glands, syncope or vomiting. The symptoms are aggravated by pollens. Her past medical history is significant for asthma.  Wheezing  This is a new problem. The current episode started in the past 7 days. The problem occurs 2 to 4 times per day. The problem has been waxing and waning. Associated symptoms include chest pain, coughing, shortness of breath, a sore throat and sputum production. Pertinent negatives include no chills, fever, headaches, hemoptysis, rash, rhinorrhea, swollen glands or vomiting. Her past medical history is significant for asthma.    January 23, 2010 4:30 PM  The pt notes a headache for three days, nose burning, There is mucus out of the nose that is yellow, there is sinus  pressure. There is increase cough and soreness in throat.   05/08/2010 More difficulty with pollen and prior to this husband had a URI.    Review of Systems  Constitutional: Negative for fever and chills.  HENT: Positive for sore throat and postnasal drip. Negative for rhinorrhea.   Respiratory: Positive for cough, sputum production, shortness of breath and wheezing. Negative for hemoptysis.   Cardiovascular: Positive for chest pain. Negative for orthopnea, leg swelling, syncope and PND.  Gastrointestinal: Positive for heartburn. Negative for vomiting.  Musculoskeletal: Negative for myalgias.  Skin: Negative for rash.  Neurological: Negative for headaches.  Hematological: Positive for environmental allergies.       Objective:   Physical Exam Gen: Pleasant, well-nourished, in no distress,  normal affect  ENT: erythema in nasal mucosa   ,  mouth clear,  oropharynx clear, moderate postnasal drip  Neck: No JVD, no TMG, no carotid bruits  Lungs: No use of accessory muscles, no dullness to percussion, few exp wheezes  Cardiovascular: RRR, heart sounds normal, no murmur or gallops, no peripheral edema  Abdomen: soft and NT, no HSM,  BS normal  Musculoskeletal: No deformities, no cyanosis or clubbing  Neuro: alert, non focal  Skin: Warm, no lesions or rashes          Assessment & Plan:

## 2010-05-08 NOTE — Patient Instructions (Addendum)
Take prednisone 10mg  Take 4 for two days, three for two days, two for two days, then one for two days and stop Start Allegra 180mg  daily for 60days Stop flonase, try veramyst two sprays daily each nostril Stay on advair daily No antibiotics Stay on prevacid  Return 6 months, sooner if necessary

## 2010-05-08 NOTE — Telephone Encounter (Signed)
Pt was actually seen in office today.  Nothing further needed from our office.  Pt aware call from today was a mistake and verbalized understanding of this.

## 2010-05-08 NOTE — Telephone Encounter (Signed)
Message was left with pt in error.  She was actually in office today and was seen by Dr. Delford Field.  Pt aware call was placed in error and verbalized understanding of this.

## 2010-05-08 NOTE — Assessment & Plan Note (Signed)
Cont gerd RX with PPI

## 2010-05-08 NOTE — Telephone Encounter (Signed)
LMOMTCB to see if pt can come in to see TP in the morning.

## 2010-05-08 NOTE — Assessment & Plan Note (Addendum)
Flare of asthma today due to atopy Plan Pulse prednisone 8days veramyst nasal spray Add allegra 180mg  daily for 30days  Cont advair as prescribed

## 2010-05-09 ENCOUNTER — Encounter: Payer: Self-pay | Admitting: Critical Care Medicine

## 2010-05-17 LAB — DIFFERENTIAL
Basophils Absolute: 0 10*3/uL (ref 0.0–0.1)
Basophils Relative: 0 % (ref 0–1)
Eosinophils Absolute: 0.2 10*3/uL (ref 0.0–0.7)
Eosinophils Relative: 2 % (ref 0–5)
Lymphocytes Relative: 16 % (ref 12–46)
Lymphs Abs: 1.8 10*3/uL (ref 0.7–4.0)
Monocytes Absolute: 0.6 10*3/uL (ref 0.1–1.0)
Monocytes Relative: 5 % (ref 3–12)
Neutro Abs: 8.7 10*3/uL — ABNORMAL HIGH (ref 1.7–7.7)
Neutrophils Relative %: 77 % (ref 43–77)

## 2010-05-17 LAB — COMPREHENSIVE METABOLIC PANEL
ALT: 22 U/L (ref 0–35)
AST: 27 U/L (ref 0–37)
Albumin: 3.4 g/dL — ABNORMAL LOW (ref 3.5–5.2)
Alkaline Phosphatase: 70 U/L (ref 39–117)
BUN: 17 mg/dL (ref 6–23)
CO2: 29 mEq/L (ref 19–32)
Calcium: 9.1 mg/dL (ref 8.4–10.5)
Chloride: 109 mEq/L (ref 96–112)
Creatinine, Ser: 0.84 mg/dL (ref 0.4–1.2)
GFR calc Af Amer: >60 mL/min (ref >60)
GFR calc non Af Amer: >60 mL/min (ref >60)
Glucose, Bld: 89 mg/dL (ref 70–99)
Potassium: 3.7 mEq/L (ref 3.5–5.1)
Sodium: 143 mEq/L (ref 135–145)
Total Bilirubin: 0.5 mg/dL (ref 0.3–1.2)
Total Protein: 7.1 g/dL (ref 6.0–8.3)

## 2010-05-17 LAB — URINE CULTURE: Colony Count: >=100000

## 2010-05-17 LAB — URINALYSIS, ROUTINE W REFLEX MICROSCOPIC
Bilirubin Urine: NEGATIVE
Glucose, UA: NEGATIVE mg/dL
Hgb urine dipstick: NEGATIVE
Ketones, ur: NEGATIVE mg/dL
Leukocytes, UA: NEGATIVE
Nitrite: NEGATIVE
Protein, ur: NEGATIVE mg/dL
Specific Gravity, Urine: 1.027 (ref 1.005–1.030)
Urobilinogen, UA: 0.2 mg/dL (ref 0.0–1.0)
pH: 5 (ref 5.0–8.0)

## 2010-05-17 LAB — POCT CARDIAC MARKERS
CKMB, poc: 5.8 ng/mL (ref 1.0–8.0)
CKMB, poc: 6.6 ng/mL (ref 1.0–8.0)
Myoglobin, poc: 181 ng/mL (ref 12–200)
Myoglobin, poc: 252 ng/mL (ref 12–200)
Troponin i, poc: <0.05 ng/mL (ref 0.00–0.09)
Troponin i, poc: <0.05 ng/mL (ref 0.00–0.09)

## 2010-05-17 LAB — LIPASE, BLOOD: Lipase: 19 U/L (ref 11–59)

## 2010-05-17 LAB — URINE MICROSCOPIC-ADD ON

## 2010-05-17 LAB — CBC
HCT: 42.8 % (ref 36.0–46.0)
Hemoglobin: 14.1 g/dL (ref 12.0–15.0)
MCHC: 32.9 g/dL (ref 30.0–36.0)
MCV: 90.1 fL (ref 78.0–100.0)
Platelets: 203 10*3/uL (ref 150–400)
RBC: 4.75 MIL/uL (ref 3.87–5.11)
RDW: 13.9 % (ref 11.5–15.5)
WBC: 11.4 10*3/uL — ABNORMAL HIGH (ref 4.0–10.5)

## 2010-05-19 LAB — DIFFERENTIAL
Basophils Absolute: 0 10*3/uL (ref 0.0–0.1)
Basophils Relative: 0 % (ref 0–1)
Eosinophils Absolute: 0.2 10*3/uL (ref 0.0–0.7)
Eosinophils Relative: 1 % (ref 0–5)
Lymphocytes Relative: 12 % (ref 12–46)
Lymphs Abs: 1.6 10*3/uL (ref 0.7–4.0)
Monocytes Absolute: 0.8 10*3/uL (ref 0.1–1.0)
Monocytes Relative: 6 % (ref 3–12)
Neutro Abs: 10.8 10*3/uL — ABNORMAL HIGH (ref 1.7–7.7)
Neutrophils Relative %: 81 % — ABNORMAL HIGH (ref 43–77)

## 2010-05-19 LAB — COMPREHENSIVE METABOLIC PANEL
ALT: 16 U/L (ref 0–35)
AST: 22 U/L (ref 0–37)
Albumin: 3.1 g/dL — ABNORMAL LOW (ref 3.5–5.2)
Alkaline Phosphatase: 73 U/L (ref 39–117)
BUN: 9 mg/dL (ref 6–23)
CO2: 29 mEq/L (ref 19–32)
Calcium: 9.2 mg/dL (ref 8.4–10.5)
Chloride: 102 mEq/L (ref 96–112)
Creatinine, Ser: 0.89 mg/dL (ref 0.4–1.2)
GFR calc Af Amer: >60 mL/min (ref >60)
GFR calc non Af Amer: >60 mL/min (ref >60)
Glucose, Bld: 107 mg/dL — ABNORMAL HIGH (ref 70–99)
Potassium: 3.3 mEq/L — ABNORMAL LOW (ref 3.5–5.1)
Sodium: 139 mEq/L (ref 135–145)
Total Bilirubin: 0.5 mg/dL (ref 0.3–1.2)
Total Protein: 7.1 g/dL (ref 6.0–8.3)

## 2010-05-19 LAB — CBC
HCT: 43.6 % (ref 36.0–46.0)
Hemoglobin: 14.6 g/dL (ref 12.0–15.0)
MCHC: 33.5 g/dL (ref 30.0–36.0)
MCV: 90.6 fL (ref 78.0–100.0)
Platelets: 223 10*3/uL (ref 150–400)
RBC: 4.81 MIL/uL (ref 3.87–5.11)
RDW: 13.5 % (ref 11.5–15.5)
WBC: 13.3 10*3/uL — ABNORMAL HIGH (ref 4.0–10.5)

## 2010-05-19 LAB — URINE MICROSCOPIC-ADD ON

## 2010-05-19 LAB — URINALYSIS, ROUTINE W REFLEX MICROSCOPIC
Bilirubin Urine: NEGATIVE
Glucose, UA: NEGATIVE mg/dL
Hgb urine dipstick: NEGATIVE
Ketones, ur: NEGATIVE mg/dL
Nitrite: NEGATIVE
Protein, ur: NEGATIVE mg/dL
Specific Gravity, Urine: 1.018 (ref 1.005–1.030)
Urobilinogen, UA: 0.2 mg/dL (ref 0.0–1.0)
pH: 5 (ref 5.0–8.0)

## 2010-05-19 LAB — HEMOCCULT GUIAC POC 1CARD (OFFICE): Fecal Occult Bld: NEGATIVE

## 2010-05-19 LAB — LIPASE, BLOOD: Lipase: 15 U/L (ref 11–59)

## 2010-05-19 LAB — PREGNANCY, URINE: Preg Test, Ur: NEGATIVE

## 2010-05-31 ENCOUNTER — Telehealth: Payer: Self-pay | Admitting: *Deleted

## 2010-05-31 MED ORDER — TRIAMCINOLONE ACETONIDE(NASAL) 55 MCG/ACT NA INHA: 2.0000 | Freq: Every day | NASAL | Status: DC

## 2010-05-31 NOTE — Telephone Encounter (Signed)
Can try triamcinolone two puff daily each nostril

## 2010-05-31 NOTE — Telephone Encounter (Signed)
Veramyst is not covered by [employer]. Member ID # [id number].  Covered alternatives include:  Fluticasone, flunisolide, triamcinolone. Pt has already tried and failed fluticasone. Please advise if one of the two remaining alternatives would be appropriate for this patient.

## 2010-05-31 NOTE — Telephone Encounter (Signed)
Sent rx to pharmacy for triamcinolone. Spoke w/ pt to advise her of the change. She was okay w/ this. Pt states if this doesn't go through then it's fine. She states nasal sprays messes up her sense of smell

## 2010-06-12 ENCOUNTER — Other Ambulatory Visit: Payer: Self-pay | Admitting: Critical Care Medicine

## 2010-06-13 ENCOUNTER — Telehealth: Payer: Self-pay | Admitting: Critical Care Medicine

## 2010-06-13 NOTE — Telephone Encounter (Signed)
Spoke w/ pt and advised her we sent rx to pharmacy yesterday. Pt verbalized understanding and advised her 1 sample of advair 250 will be left upfront for pick up. Nothing further was needed

## 2010-06-28 NOTE — Assessment & Plan Note (Signed)
[facility]                                   ON-CALL NOTE   NAME:[name], Nuon                        MRN:          [id number]  DATE:12/06/2005                            DOB:          18-May-1960    TIME OF CALL:  3:30 p.m.   PHONE NUMBER:  [phone].   PULMONARY PHYSICIAN:  Charlcie Cradle. Delford Field, MD, FCCP.   PROBLEM:  The patient had medicines called in but albuterol inhaler was left  off.   PLAN:  I called prescription for albuterol metered inhaler #1 two puffs  q.i.d. p.r.n. to [facility] on [address]. Refill p.r.n. She will keep  scheduled appointment.     Clinton D. Maple Hudson, MD, FCCP, FACP    CDY/MedQ  DD: 12/07/2005  DT: 12/08/2005  Job #: [id number]   cc:   Charlcie Cradle. Delford Field, MD, FCCP

## 2010-06-28 NOTE — Assessment & Plan Note (Signed)
[facility]                               PULMONARY OFFICE NOTE   NAME:[name], [name]                      MRN:          [id number]  DATE:11/19/2005                            DOB:          03-28-1960    This is a 50 year old, African-American female with history of moderate  persistent asthma, hypertension and reflux disease.  She comes in today and  has not been seen since May 2006.  She has no active respiratory complaints  except she is still using her albuterol inhaler twice daily.  She stopped  taking her Pulmicort about 6 months ago, also having increased reflux  symptoms.  She is only on lisinopril 20 mg daily and p.r.n. albuterol.   PHYSICAL EXAMINATION:  GENERAL:  This is an obese, African-American female  in no distress.  VITAL SIGNS:  Temperature 97.8, blood pressure 118/78, pulse 75, saturations  98% on room air.  CHEST:  Clear without evidence of wheeze or rhonchi.  CARDIAC:  Regular rate and rhythm without S3, normal S1, S2.  ABDOMEN:  Soft, nontender.  EXTREMITIES:  No edema or clubbing.  SKIN:  Clear.  NEUROLOGIC:  Intact.   IMPRESSION:  Moderate persistent asthma with lack of medical adherence.   PLAN:  Resume Pulmicort inhaler two sprays b.i.d.  Maintain albuterol p.r.n.  Begin Protonix 40 mg daily for reflux.  We will see the patient back in 4  months.       Charlcie Cradle Delford Field, MD, [facility]      PEW/MedQ  DD:  11/19/2005  DT:  11/21/2005  Job #:  [id number]

## 2010-06-28 NOTE — Assessment & Plan Note (Signed)
[facility]                             PULMONARY OFFICE NOTE   NAME:DAVISPercy, Comp                      MRN:          [id number]  DATE:05/06/2006                            DOB:          [birth year]    Miss Geremia returns today in followup. This is a 50 year old African  American female history of moderate persistent asthma. She has been  medically nonadherent in the past, last seen in October. She is still  using her albuterol on an over use basis. She has not had her Pulmicort  filled. She states that because her insurance does not cover it. She is  on lisinopril 20 mg daily and albuterol p.r.n.   On exam, temperature 97.8, blood pressure 110/80, pulse 73, saturation  98% on room air.  CHEST: Showed a few expired wheezes with poor airflow.  CARDIAC EXAM: Showed a regular rate and rhythm without S3, normal S1,  S2.  ABDOMEN: Soft, nontender.  EXTREMITIES: Showed no edema or clubbing.  SKIN: Clear.   Pulmonary functions show moderate obstructive defect, FEV 1 of 68%  predicted, FVC of 82% predicted, FEF 25/75 of 37% of predicted.   IMPRESSION:  That of moderate obstructive defect with severe  obstruction.   PLAN:  For the patient to resume Advair 250/51 spray b.i.d. and samples  were given. Instructions given to its proper use and refills were given.  We will see the patient back in follow up in a month.     Charlcie Cradle Delford Field, MD, [facility]  Electronically Signed    PEW/MedQ  DD: 05/06/2006  DT: 05/06/2006  Job #: [id number]   cc:   Olene Craven, M.D.

## 2010-07-15 ENCOUNTER — Telehealth: Payer: Self-pay | Admitting: Critical Care Medicine

## 2010-07-15 NOTE — Telephone Encounter (Signed)
LMOMTCBx1. Just need to inform pt samples left at front desk for pt to pick up.  2 boxes of Advair 250/50 Lot# [id number]  Exp 08/2011 and 1 box of proair lot # [id number]  Exp 7/20113

## 2010-07-16 NOTE — Telephone Encounter (Signed)
Patient called back.  Informed patient samples were left out front.

## 2010-08-16 ENCOUNTER — Other Ambulatory Visit: Payer: Self-pay

## 2010-08-16 MED ORDER — LOSARTAN POTASSIUM-HCTZ 100-25 MG PO TABS: 1.0000 | ORAL_TABLET | Freq: Every day | ORAL | Status: DC

## 2010-08-21 ENCOUNTER — Other Ambulatory Visit: Payer: Self-pay

## 2010-08-21 NOTE — Telephone Encounter (Signed)
K was normal in feb 2012 with last labs, and I dont believe pt was taking this medication  No need to fill if she was not taking the medication in Feb 2012 at time of last labs  Please verify with pt

## 2010-08-21 NOTE — Telephone Encounter (Signed)
Pt called requesting refill of Potassium that she was prescribed by her previous MD.

## 2010-08-22 NOTE — Telephone Encounter (Signed)
Left message on machine for pt to return my call  

## 2010-08-23 NOTE — Telephone Encounter (Signed)
Left message on machine for pt to return my call  

## 2010-09-12 ENCOUNTER — Telehealth: Payer: Self-pay | Admitting: Internal Medicine

## 2010-09-12 ENCOUNTER — Telehealth: Payer: Self-pay | Admitting: Critical Care Medicine

## 2010-09-12 NOTE — Telephone Encounter (Signed)
ERROR. NO MSG NEEDED.

## 2010-09-12 NOTE — Telephone Encounter (Signed)
Pt last seen by PEW 3.28.12 and told to follow up in 6 mo.  Called spoke with patient who is requesting samples of advair 250-77mcg and albuterol rescue, reporting that she "simply cannot afford it right now."  2 samples advair 250-50 left up for pt to pick up at her convenience with coupons (lot: [id number], exp:09/2011).  No albuterol samples for pt at this time.  Pt verbalized her understanding.

## 2010-09-27 ENCOUNTER — Other Ambulatory Visit (INDEPENDENT_AMBULATORY_CARE_PROVIDER_SITE_OTHER): Payer: PRIVATE HEALTH INSURANCE

## 2010-09-27 ENCOUNTER — Ambulatory Visit (INDEPENDENT_AMBULATORY_CARE_PROVIDER_SITE_OTHER)
Admission: RE | Admit: 2010-09-27 | Discharge: 2010-09-27 | Disposition: A | Discharge status: Home / Self Care | Payer: PRIVATE HEALTH INSURANCE | Source: Ambulatory Visit | Attending: Internal Medicine | Admitting: Internal Medicine

## 2010-09-27 ENCOUNTER — Ambulatory Visit (INDEPENDENT_AMBULATORY_CARE_PROVIDER_SITE_OTHER): Payer: PRIVATE HEALTH INSURANCE | Admitting: Internal Medicine

## 2010-09-27 ENCOUNTER — Encounter: Payer: Self-pay | Admitting: Internal Medicine

## 2010-09-27 VITALS — BP 130/90 | HR 78 | Temp 98.5°F | Ht 62.0 in | Wt 247.8 lb

## 2010-09-27 DIAGNOSIS — R5383 Other fatigue: Secondary | ICD-10-CM

## 2010-09-27 DIAGNOSIS — Z0000 Encounter for general adult medical examination without abnormal findings: Secondary | ICD-10-CM

## 2010-09-27 DIAGNOSIS — Z0001 Encounter for general adult medical examination with abnormal findings: Secondary | ICD-10-CM | POA: Insufficient documentation

## 2010-09-27 DIAGNOSIS — R300 Dysuria: Secondary | ICD-10-CM

## 2010-09-27 DIAGNOSIS — E876 Hypokalemia: Secondary | ICD-10-CM

## 2010-09-27 DIAGNOSIS — R079 Chest pain, unspecified: Secondary | ICD-10-CM

## 2010-09-27 DIAGNOSIS — R5381 Other malaise: Secondary | ICD-10-CM

## 2010-09-27 LAB — POCT URINALYSIS DIPSTICK
Bilirubin, UA: NEGATIVE
Blood, UA: NEGATIVE
Glucose, UA: NEGATIVE
Ketones, UA: NEGATIVE
Leukocytes, UA: NEGATIVE
Nitrite, UA: NEGATIVE
Protein, UA: NEGATIVE
Spec Grav, UA: 1.03
pH, UA: 5

## 2010-09-27 LAB — BASIC METABOLIC PANEL
BUN: 23 mg/dL (ref 6–23)
CO2: 30 mEq/L (ref 19–32)
Calcium: 9.6 mg/dL (ref 8.4–10.5)
Chloride: 104 mEq/L (ref 96–112)
Creatinine, Ser: 0.9 mg/dL (ref 0.4–1.2)
GFR: 84.33 mL/min (ref >60.00)
Glucose, Bld: 91 mg/dL (ref 70–99)
Potassium: 3.4 mEq/L — ABNORMAL LOW (ref 3.5–5.1)
Sodium: 143 mEq/L (ref 135–145)

## 2010-09-27 LAB — CBC WITH DIFFERENTIAL/PLATELET
Basophils Absolute: 0 10*3/uL (ref 0.0–0.1)
Basophils Relative: 0.4 % (ref 0.0–3.0)
Eosinophils Absolute: 0.2 10*3/uL (ref 0.0–0.7)
Eosinophils Relative: 2.1 % (ref 0.0–5.0)
HCT: 41.4 % (ref 36.0–46.0)
Hemoglobin: 13.5 g/dL (ref 12.0–15.0)
Lymphocytes Relative: 22.7 % (ref 12.0–46.0)
Lymphs Abs: 2.7 10*3/uL (ref 0.7–4.0)
MCHC: 32.7 g/dL (ref 30.0–36.0)
MCV: 90 fl (ref 78.0–100.0)
Monocytes Absolute: 0.8 10*3/uL (ref 0.1–1.0)
Monocytes Relative: 6.8 % (ref 3.0–12.0)
Neutro Abs: 8 10*3/uL — ABNORMAL HIGH (ref 1.4–7.7)
Neutrophils Relative %: 68 % (ref 43.0–77.0)
Platelets: 248 10*3/uL (ref 150.0–400.0)
RBC: 4.6 Mil/uL (ref 3.87–5.11)
RDW: 14.3 % (ref 11.5–14.6)
WBC: 11.7 10*3/uL — ABNORMAL HIGH (ref 4.5–10.5)

## 2010-09-27 LAB — HEPATIC FUNCTION PANEL
ALT: 25 U/L (ref 0–35)
AST: 26 U/L (ref 0–37)
Albumin: 3.7 g/dL (ref 3.5–5.2)
Alkaline Phosphatase: 97 U/L (ref 39–117)
Bilirubin, Direct: 0.1 mg/dL (ref 0.0–0.3)
Total Bilirubin: 0.5 mg/dL (ref 0.3–1.2)
Total Protein: 7.5 g/dL (ref 6.0–8.3)

## 2010-09-27 MED ORDER — CEPHALEXIN 500 MG PO CAPS: 500.0000 mg | ORAL_CAPSULE | Freq: Four times a day (QID) | ORAL | Status: AC

## 2010-09-27 MED ORDER — POTASSIUM CHLORIDE ER 10 MEQ PO TBCR: 10.0000 meq | EXTENDED_RELEASE_TABLET | Freq: Two times a day (BID) | ORAL | Status: DC

## 2010-09-27 MED ORDER — TRAMADOL-ACETAMINOPHEN 37.5-325 MG PO TABS: 1.0000 | ORAL_TABLET | Freq: Four times a day (QID) | ORAL | Status: AC | PRN

## 2010-09-27 NOTE — Assessment & Plan Note (Signed)
Right lateral chest/side pain, no evidence for pyelo/pt reassured;  Will check cxr/rib film but suspect bening msk strain, to cont the ibuprofen prn

## 2010-09-27 NOTE — Progress Notes (Signed)
Subjective:    Patient ID: [name], female    DOB: 1961/01/30, 50 y.o.   MRN: [id number]  HPI  Here to f/u today, with c/o mild dysuria and mild freq for 2-3 days, without urgency , hematuria,, fever/chills, but became concerned today when developed right lateral CP, somewhat near the right flank and she though might be a kidney infection.  Denies abd pain, n/v or flank pain, but the right lateral CP has been sharp, tender to palpate the area, pleuritic, and worse to bend the torso to the left, no obvious cause such as trauma but has been doing more work about the house.  Also has managed to be more active , decrease calories and intentionally lose wt from 260 to 247.  Pt denies other chest pain, increased sob or doe, wheezing, orthopnea, PND, increased LE swelling, palpitations, dizziness or syncope. Pt denies new neurological symptoms such as new headache, or facial or extremity weakness or numbness  Pt denies polydipsia, polyuria. Allergies have seemed to improve, so no longer taking prior allergy med, or celebrex due to cost - now on ibuprofen 800 tid prn.  Denies worsening depressive symptoms, suicidal ideation, or panic, and has been able to not take ativan for some time.  Pt also requestss K re-check as she has recurrent low K.  Does have sense of ongoing fatigue, but denies signficant hypersomnolence. \ Past Medical History  Diagnosis Date  . COPD (chronic obstructive pulmonary disease)   . Hypertension   . Disorder of vocal cord   . Asthma   . Obesity (BMI 30-39.9)   . GERD (gastroesophageal reflux disease)   . Sinusitis   . Alcohol abuse   . DJD (degenerative joint disease)     right knee, mot to severe  . Anxiety   . Panic   . History of colon polyps   . History of colonic diverticulitis   . ANXIETY 04/05/2010  . ASTHMA 04/24/2007  . COLONIC POLYPS, HX OF 04/05/2010  . DIVERTICULITIS, HX OF 04/05/2010  . GERD 04/24/2007  . HYPERTENSION 04/24/2007  . OBESITY 04/24/2007  .  PALPITATIONS, HX OF 09/14/2007  . VOCAL CORD DISORDER 04/24/2007   Past Surgical History  Procedure Date  . Vesicovaginal fistula closure w/ tah   . Abdominal hysterectomy     reports that she quit smoking about 22 years ago. Her smoking use included Cigarettes. She has a 3.5 pack-year smoking history. She has never used smokeless tobacco. She reports that she drinks alcohol. She reports that she does not use illicit drugs. family history includes Lymphoma in her sister and Stroke in her mother. Allergies  Allergen Reactions  . Morphine    Current Outpatient Prescriptions on File Prior to Visit  Medication Sig Dispense Refill  . ADVAIR DISKUS 250-50 MCG/DOSE AEPB USE 1 PUFF 2 TIMES A DAY  1 each  6  . albuterol (PROAIR HFA) 108 (90 BASE) MCG/ACT inhaler 2 puffs every 4-6 hours as needed       . losartan-hydrochlorothiazide (HYZAAR) 100-25 MG per tablet Take 1 tablet by mouth daily.  90 tablet  3   Review of Systems Review of Systems  Constitutional: Negative for diaphoresis and unexpected weight change.  HENT: Negative for drooling and tinnitus.   Eyes: Negative for photophobia and visual disturbance.  Respiratory: Negative for choking and stridor.   Gastrointestinal: Negative for vomiting and blood in stool.  Genitourinary: Negative for hematuria and decreased urine volume.  Musculoskeletal: Negative for gait problem.  Skin: Negative for color change and wound.  Neurological: Negative for tremors and numbness.  Psychiatric/Behavioral: Negative for decreased concentration. The patient is not hyperactive.       Objective:   Physical Exam BP 130/90  Pulse 78  Temp(Src) 98.5 F (36.9 C) (Oral)  Ht 5\' 2"  (1.575 m)  Wt 247 lb 12.8 oz (112.401 kg)  BMI 45.32 kg/m2  SpO2 98% Physical Exam  VS noted Constitutional: Pt appears well-developed and well-nourished.  HENT: Head: Normocephalic.  Right Ear: External ear normal.  Left Ear: External ear normal.  Eyes: Conjunctivae and EOM  are normal. Pupils are equal, round, and reactive to light.  Neck: Normal range of motion. Neck supple.  Cardiovascular: Normal rate and regular rhythm.   Pulmonary/Chest: Effort normal and breath sounds normal.  Abd:  Soft, NT, non-distended, + BS, no flank tender Neurological: Pt is alert. No cranial nerve deficit.  Skin: Skin is warm. No erythema.  Psychiatric: Pt behavior is normal. Thought content normal.  Has focal tender area to right mid clavicular line approx t6 level without swelling, erythema, rash       Assessment & Plan:

## 2010-09-27 NOTE — Assessment & Plan Note (Signed)
With recent low K - o/w Etiology unclear, Exam otherwise benign, to check labs as documented, follow with expectant management

## 2010-09-27 NOTE — Patient Instructions (Addendum)
Take all new medications as prescribed - the antibiotic, and the pain medication Your urine will be sent for urine culture Please go to LAB in the Basement for the blood and/or urine tests to be done today Please go to XRAY in the Basement for the x-ray test Please call the phone number [phone] (the [employer]Tree System) for results of testing in 2-3 days;  When calling, simply dial the number, and when prompted enter the MRN number above (the Medical Record Number) and the # key, then the message should start. Please return in 6 mo with Lab testing done 3-5 days before

## 2010-09-27 NOTE — Assessment & Plan Note (Signed)
ua dip neg, but with symtpoms, will tx for urethritis/UTI and check urine cx,  to f/u any worsening symptoms or concerns

## 2010-09-29 LAB — URINE CULTURE: Colony Count: 15000

## 2010-09-30 ENCOUNTER — Other Ambulatory Visit: Payer: Self-pay

## 2010-09-30 MED ORDER — POTASSIUM CHLORIDE ER 10 MEQ PO TBCR: 10.0000 meq | EXTENDED_RELEASE_TABLET | Freq: Every day | ORAL | Status: DC

## 2010-10-01 ENCOUNTER — Telehealth: Payer: Self-pay | Admitting: *Deleted

## 2010-10-01 NOTE — Telephone Encounter (Signed)
Rib films and cxr neg  Consider f/u OV if pain persists

## 2010-10-01 NOTE — Telephone Encounter (Signed)
Patient requesting results of last xray, she is still in pain.

## 2010-10-02 NOTE — Telephone Encounter (Signed)
Pt advised via VM of Xray results

## 2010-11-08 ENCOUNTER — Inpatient Hospital Stay (INDEPENDENT_AMBULATORY_CARE_PROVIDER_SITE_OTHER)
Admission: RE | Admit: 2010-11-08 | Discharge: 2010-11-08 | Disposition: A | Discharge status: Home / Self Care | Payer: PRIVATE HEALTH INSURANCE | Source: Ambulatory Visit | Attending: Family Medicine | Admitting: Family Medicine

## 2010-11-08 ENCOUNTER — Telehealth: Payer: Self-pay

## 2010-11-08 ENCOUNTER — Telehealth: Payer: Self-pay | Admitting: Critical Care Medicine

## 2010-11-08 DIAGNOSIS — N390 Urinary tract infection, site not specified: Secondary | ICD-10-CM

## 2010-11-08 DIAGNOSIS — J019 Acute sinusitis, unspecified: Secondary | ICD-10-CM

## 2010-11-08 DIAGNOSIS — R350 Frequency of micturition: Secondary | ICD-10-CM

## 2010-11-08 LAB — CBC
HCT: 43.1
Hemoglobin: 14.4
MCHC: 33.3
MCV: 90.1
Platelets: 244
RBC: 4.79
RDW: 13.8
WBC: 14.1 — ABNORMAL HIGH

## 2010-11-08 LAB — BASIC METABOLIC PANEL
BUN: 20
CO2: 27
Calcium: 9.2
Chloride: 104
Creatinine, Ser: 0.97
GFR calc Af Amer: >60
GFR calc non Af Amer: >60
Glucose, Bld: 98
Potassium: 3.3 — ABNORMAL LOW
Sodium: 138

## 2010-11-08 LAB — POCT URINALYSIS DIP (DEVICE)
Bilirubin Urine: NEGATIVE
Glucose, UA: NEGATIVE mg/dL
Hgb urine dipstick: NEGATIVE
Leukocytes, UA: NEGATIVE
Nitrite: NEGATIVE
Protein, ur: NEGATIVE mg/dL
Specific Gravity, Urine: 1.02 (ref 1.005–1.030)
Urobilinogen, UA: 0.2 mg/dL (ref 0.0–1.0)
pH: 5 (ref 5.0–8.0)

## 2010-11-08 LAB — POCT CARDIAC MARKERS
CKMB, poc: 5.6
CKMB, poc: 5.8
Myoglobin, poc: 144
Myoglobin, poc: 171
Troponin i, poc: <0.05
Troponin i, poc: <0.05

## 2010-11-08 LAB — B-NATRIURETIC PEPTIDE (CONVERTED LAB): Pro B Natriuretic peptide (BNP): <30

## 2010-11-08 NOTE — Telephone Encounter (Signed)
Patient informed and agreed to have urine checked. She informed me that she made an appoinement with her GYN and would be ok with a referral to a urologist if needed.

## 2010-11-08 NOTE — Telephone Encounter (Signed)
Referral done per emr 

## 2010-11-08 NOTE — Telephone Encounter (Signed)
Called, spoke with pt.  I informed her RA recs she try delsym 2 tsp tid first.  If fever persists or she started to have colored sputum, call for abx.  Also advised to call back for OV with PW or TP if no better by next week.  She verbalized understanding of these recs and ok with it.

## 2010-11-08 NOTE — Telephone Encounter (Signed)
Patient called informing pain when urinating, can't hold urine, all has worsened since was seen at OV. Did not complete antibiotics (took maybe for 2-3 days only) as caused stomach upset.  Please advise as patient will go to ER or urgent care if needed as no openings today.

## 2010-11-08 NOTE — Telephone Encounter (Signed)
I do not see this med on her list. Also, she has not been seen x 6 mnths Try DELSYM 2 tsp tid first If fevr persists or colored sputum - call for Abx Pl arrange OV with TP or PW if no better by next week

## 2010-11-08 NOTE — Telephone Encounter (Signed)
Last visit was aug 17, urine cx neg at that time, tx with cephalexin which does not normally cause nausea or GI upset.    I am not sure that she has infection, ok for repeat UA 788.41 and I would tx if abnormal, but o/w ask pt if she would see urology - I can refer if she wants this

## 2010-11-08 NOTE — Telephone Encounter (Signed)
Spoke with pt. She is c/o dry cough, fever of 100.1, PND and green nasal d/c. She is requesting rx for tussionex. I offered ov with TP and she refused, stating would rather have something called in. PW out of the office, so will forward to doc of the day. RA, pls advise, thanks!

## 2010-11-08 NOTE — Telephone Encounter (Signed)
Put order in for UA. 

## 2010-11-12 LAB — DIFFERENTIAL
Basophils Absolute: 0
Basophils Relative: 0
Eosinophils Absolute: 0.2
Eosinophils Relative: 2
Lymphocytes Relative: 18
Lymphs Abs: 2.8
Monocytes Absolute: 1.2 — ABNORMAL HIGH
Monocytes Relative: 8
Neutro Abs: 11.2 — ABNORMAL HIGH
Neutrophils Relative %: 73

## 2010-11-12 LAB — POCT URINALYSIS DIP (DEVICE)
Bilirubin Urine: NEGATIVE
Glucose, UA: NEGATIVE
Hgb urine dipstick: NEGATIVE
Ketones, ur: 15 — AB
Nitrite: NEGATIVE
Operator id: 29721
Protein, ur: NEGATIVE
Specific Gravity, Urine: 1.02
Urobilinogen, UA: 0.2
pH: 5.5

## 2010-11-12 LAB — CBC
HCT: 42
Hemoglobin: 13.5
MCHC: 32.1
MCV: 88
Platelets: 254
RBC: 4.77
RDW: 13.1
WBC: 15.4 — ABNORMAL HIGH

## 2010-11-17 ENCOUNTER — Emergency Department (HOSPITAL_COMMUNITY)
Admission: EM | Admit: 2010-11-17 | Discharge: 2010-11-17 | Disposition: A | Discharge status: Home / Self Care | Payer: PRIVATE HEALTH INSURANCE | Attending: Emergency Medicine | Admitting: Emergency Medicine

## 2010-11-17 DIAGNOSIS — N390 Urinary tract infection, site not specified: Secondary | ICD-10-CM | POA: Insufficient documentation

## 2010-11-17 DIAGNOSIS — I10 Essential (primary) hypertension: Secondary | ICD-10-CM | POA: Insufficient documentation

## 2010-11-17 DIAGNOSIS — M129 Arthropathy, unspecified: Secondary | ICD-10-CM | POA: Insufficient documentation

## 2010-11-17 DIAGNOSIS — R3919 Other difficulties with micturition: Secondary | ICD-10-CM | POA: Insufficient documentation

## 2010-11-17 LAB — URINALYSIS, ROUTINE W REFLEX MICROSCOPIC
Glucose, UA: NEGATIVE mg/dL
Ketones, ur: 15 mg/dL — AB
Nitrite: NEGATIVE
Protein, ur: 100 mg/dL — AB
Specific Gravity, Urine: 1.03 (ref 1.005–1.030)
Urobilinogen, UA: 1 mg/dL (ref 0.0–1.0)
pH: 5 (ref 5.0–8.0)

## 2010-11-17 LAB — URINE MICROSCOPIC-ADD ON

## 2010-11-19 LAB — URINE CULTURE
Colony Count: >=100000
Culture  Setup Time: 201210080222

## 2010-11-28 ENCOUNTER — Telehealth: Payer: Self-pay

## 2010-11-28 NOTE — Telephone Encounter (Signed)
Pt called requesting results of urine culture done at [facility] last week, please advise.

## 2010-11-28 NOTE — Telephone Encounter (Signed)
Urine cx was abnormal, with resistant e coli to ampicillin and septra  Please ask how pt was treated - ? Which antibx

## 2010-11-28 NOTE — Telephone Encounter (Signed)
Left message on hiome machine for pt to return my call, work number disconnected

## 2010-11-29 NOTE — Telephone Encounter (Signed)
This should be effective according to the sensitivities, ok to cont tx as is

## 2010-11-29 NOTE — Telephone Encounter (Signed)
Pt states that she was given Rx for Keflex [phone] VM OK

## 2010-12-02 NOTE — Telephone Encounter (Signed)
Pt informed, states she has appt with Urology 12/04/2010

## 2010-12-11 ENCOUNTER — Telehealth: Payer: Self-pay | Admitting: *Deleted

## 2010-12-11 NOTE — Telephone Encounter (Signed)
Patient requesting a call back regarding results from last OV.

## 2010-12-11 NOTE — Telephone Encounter (Signed)
Not sure how to help exactly, except to say consider OV if pt feels she cannot wait until urology appt

## 2010-12-11 NOTE — Telephone Encounter (Signed)
Called and left message to call back.

## 2010-12-11 NOTE — Telephone Encounter (Signed)
Patient is having blood in her urine and anxiety. She has an appointment with a urologist on Wednesday of next week. Please advise, the patient can be reached on her cell phone [phone]

## 2010-12-12 ENCOUNTER — Ambulatory Visit: Payer: PRIVATE HEALTH INSURANCE

## 2010-12-12 NOTE — Telephone Encounter (Signed)
Pt advised to schedule appt with first available MD

## 2010-12-18 ENCOUNTER — Other Ambulatory Visit (INDEPENDENT_AMBULATORY_CARE_PROVIDER_SITE_OTHER): Payer: PRIVATE HEALTH INSURANCE

## 2010-12-18 ENCOUNTER — Telehealth: Payer: Self-pay

## 2010-12-18 ENCOUNTER — Other Ambulatory Visit: Payer: Self-pay | Admitting: Internal Medicine

## 2010-12-18 DIAGNOSIS — R350 Frequency of micturition: Secondary | ICD-10-CM

## 2010-12-18 DIAGNOSIS — E876 Hypokalemia: Secondary | ICD-10-CM

## 2010-12-18 LAB — BASIC METABOLIC PANEL
BUN: 20 mg/dL (ref 6–23)
CO2: 32 mEq/L (ref 19–32)
Calcium: 8.9 mg/dL (ref 8.4–10.5)
Chloride: 103 mEq/L (ref 96–112)
Creatinine, Ser: 1.2 mg/dL (ref 0.4–1.2)
GFR: 58.95 mL/min — ABNORMAL LOW (ref >60.00)
Glucose, Bld: 85 mg/dL (ref 70–99)
Potassium: 3.5 mEq/L (ref 3.5–5.1)
Sodium: 141 mEq/L (ref 135–145)

## 2010-12-18 LAB — URINALYSIS, ROUTINE W REFLEX MICROSCOPIC
Bilirubin Urine: NEGATIVE
Ketones, ur: NEGATIVE
Nitrite: NEGATIVE
Specific Gravity, Urine: 1.025 (ref 1.000–1.030)
Total Protein, Urine: NEGATIVE
Urine Glucose: NEGATIVE
Urobilinogen, UA: 0.2 (ref 0.0–1.0)
pH: 6 (ref 5.0–8.0)

## 2010-12-18 MED ORDER — CEPHALEXIN 500 MG PO CAPS: 500.0000 mg | ORAL_CAPSULE | Freq: Four times a day (QID) | ORAL | Status: AC

## 2010-12-18 NOTE — Telephone Encounter (Signed)
Patient informed bmet added.

## 2010-12-18 NOTE — Telephone Encounter (Signed)
Ok for UA and culture  788.41  

## 2010-12-18 NOTE — Telephone Encounter (Signed)
Patient called to inform she missed her urologist appointment yesterday due to getting lost. She cannot get another appointment until after Thanksgiving. She has some blood in her urine and is requesting a urine test from [facility] and to prescribe antibiotics if needed until can be seen by urologist. Please advise

## 2010-12-18 NOTE — Telephone Encounter (Signed)
Called the patient informed to come and have urine checked in the lab. The patient would also like her potassium checked as well. Please advise

## 2010-12-18 NOTE — Telephone Encounter (Signed)
Ok for bmet - 276.8

## 2010-12-25 ENCOUNTER — Emergency Department (HOSPITAL_COMMUNITY)
Admission: EM | Admit: 2010-12-25 | Discharge: 2010-12-25 | Disposition: A | Discharge status: Home / Self Care | Payer: PRIVATE HEALTH INSURANCE | Attending: Emergency Medicine | Admitting: Emergency Medicine

## 2010-12-25 ENCOUNTER — Encounter (HOSPITAL_COMMUNITY): Payer: Self-pay | Admitting: Emergency Medicine

## 2010-12-25 DIAGNOSIS — R319 Hematuria, unspecified: Secondary | ICD-10-CM | POA: Insufficient documentation

## 2010-12-25 DIAGNOSIS — M549 Dorsalgia, unspecified: Secondary | ICD-10-CM | POA: Insufficient documentation

## 2010-12-25 DIAGNOSIS — E669 Obesity, unspecified: Secondary | ICD-10-CM | POA: Insufficient documentation

## 2010-12-25 DIAGNOSIS — I10 Essential (primary) hypertension: Secondary | ICD-10-CM | POA: Insufficient documentation

## 2010-12-25 DIAGNOSIS — R42 Dizziness and giddiness: Secondary | ICD-10-CM | POA: Insufficient documentation

## 2010-12-25 DIAGNOSIS — N939 Abnormal uterine and vaginal bleeding, unspecified: Secondary | ICD-10-CM

## 2010-12-25 DIAGNOSIS — J449 Chronic obstructive pulmonary disease, unspecified: Secondary | ICD-10-CM | POA: Insufficient documentation

## 2010-12-25 DIAGNOSIS — Z79899 Other long term (current) drug therapy: Secondary | ICD-10-CM | POA: Insufficient documentation

## 2010-12-25 DIAGNOSIS — N898 Other specified noninflammatory disorders of vagina: Secondary | ICD-10-CM | POA: Insufficient documentation

## 2010-12-25 DIAGNOSIS — K219 Gastro-esophageal reflux disease without esophagitis: Secondary | ICD-10-CM | POA: Insufficient documentation

## 2010-12-25 DIAGNOSIS — J4489 Other specified chronic obstructive pulmonary disease: Secondary | ICD-10-CM | POA: Insufficient documentation

## 2010-12-25 LAB — URINALYSIS, ROUTINE W REFLEX MICROSCOPIC
Bilirubin Urine: NEGATIVE
Glucose, UA: NEGATIVE mg/dL
Hgb urine dipstick: NEGATIVE
Ketones, ur: NEGATIVE mg/dL
Leukocytes, UA: NEGATIVE
Nitrite: NEGATIVE
Protein, ur: NEGATIVE mg/dL
Specific Gravity, Urine: 1.02 (ref 1.005–1.030)
Urobilinogen, UA: 0.2 mg/dL (ref 0.0–1.0)
pH: 6 (ref 5.0–8.0)

## 2010-12-25 LAB — CBC
HCT: 40.3 % (ref 36.0–46.0)
Hemoglobin: 13.4 g/dL (ref 12.0–15.0)
MCH: 29.7 pg (ref 26.0–34.0)
MCHC: 33.3 g/dL (ref 30.0–36.0)
MCV: 89.4 fL (ref 78.0–100.0)
Platelets: 264 10*3/uL (ref 150–400)
RBC: 4.51 MIL/uL (ref 3.87–5.11)
RDW: 14.4 % (ref 11.5–15.5)
WBC: 11.2 10*3/uL — ABNORMAL HIGH (ref 4.0–10.5)

## 2010-12-25 LAB — DIFFERENTIAL
Basophils Absolute: 0 10*3/uL (ref 0.0–0.1)
Basophils Relative: 0 % (ref 0–1)
Eosinophils Absolute: 0.2 10*3/uL (ref 0.0–0.7)
Eosinophils Relative: 2 % (ref 0–5)
Lymphocytes Relative: 17 % (ref 12–46)
Lymphs Abs: 1.9 10*3/uL (ref 0.7–4.0)
Monocytes Absolute: 0.6 10*3/uL (ref 0.1–1.0)
Monocytes Relative: 5 % (ref 3–12)
Neutro Abs: 8.6 10*3/uL — ABNORMAL HIGH (ref 1.7–7.7)
Neutrophils Relative %: 77 % (ref 43–77)

## 2010-12-25 LAB — SAMPLE TO BLOOD BANK

## 2010-12-25 NOTE — ED Notes (Signed)
PT. REPORTS HEMATURIA SINCE OCT. 7,2012 , DENIES DYSURIA , NO FEVER OR CHILLS, DENIES INJURY OR FALL.

## 2010-12-25 NOTE — ED Provider Notes (Signed)
History     CSN: [id number] Arrival date & time: 12/25/2010  4:13 AM   First MD Initiated Contact with Patient 12/25/10 0449      Chief Complaint  Patient presents with  . Hematuria    (Consider location/radiation/quality/duration/timing/severity/associated sxs/prior treatment) HPI 50 year old female presents to the emergency department with complaint of blood in urine. She reports she's had this problem for the last month. Patient is awaiting evaluation by urologist, and has appointment on the 20th, in 6 days. Patient reports she was seen 2 days ago by her primary care Dr. and restarted on Keflex for mild urinary tract infection. She has had some back pain. She denies any nausea vomiting or fever. Patient also treated for UTI last month. Patient reports over last 24 hours she's had increased blood in her urine. Patient feels like she is bleeding out and needs a blood transfusion as she feels dizzy. Patient requesting admission to the hospital for inpatient cystoscopy and ultrasound of kidneys Past Medical History  Diagnosis Date  . COPD (chronic obstructive pulmonary disease)   . Hypertension   . Disorder of vocal cord   . Asthma   . Obesity (BMI 30-39.9)   . GERD (gastroesophageal reflux disease)   . Sinusitis   . Alcohol abuse   . DJD (degenerative joint disease)     right knee, mot to severe  . Anxiety   . Panic   . History of colon polyps   . History of colonic diverticulitis   . ANXIETY 04/05/2010  . ASTHMA 04/24/2007  . COLONIC POLYPS, HX OF 04/05/2010  . DIVERTICULITIS, HX OF 04/05/2010  . GERD 04/24/2007  . HYPERTENSION 04/24/2007  . OBESITY 04/24/2007  . PALPITATIONS, HX OF 09/14/2007  . VOCAL CORD DISORDER 04/24/2007    Past Surgical History  Procedure Date  . Vesicovaginal fistula closure w/ tah   . Abdominal hysterectomy     Family History  Problem Relation Age of Onset  . Lymphoma Sister   . Stroke Mother     History  Substance Use Topics  . Smoking  status: Former Smoker -- 0.5 packs/day for 7 years    Types: Cigarettes    Quit date: 02/11/1988  . Smokeless tobacco: Never Used  . Alcohol Use: Yes     occasionally    OB History    Grav Para Term Preterm Abortions TAB SAB Ect Mult Living                  Review of Systems  All other systems reviewed and are negative.    Allergies  Morphine  Home Medications   Current Outpatient Rx  Name Route Sig Dispense Refill  . ADVAIR DISKUS 250-50 MCG/DOSE IN AEPB  USE 1 PUFF 2 TIMES A DAY 1 each 6    Dispense as written.  . ALBUTEROL SULFATE HFA 108 (90 BASE) MCG/ACT IN AERS Inhalation Inhale 2 puffs into the lungs every 4 (four) hours as needed. For breathing    . CEPHALEXIN 500 MG PO CAPS Oral Take 1 capsule (500 mg total) by mouth 4 (four) times daily. 40 capsule 0  . IBUPROFEN 800 MG PO TABS Oral Take 800 mg by mouth daily.      Marland Kitchen LOSARTAN POTASSIUM-HCTZ 100-25 MG PO TABS Oral Take 1 tablet by mouth daily. 90 tablet 3  . PANTOPRAZOLE SODIUM 40 MG PO TBEC Oral Take 40 mg by mouth daily.        BP 163/101  Pulse 68  Temp(Src)  97.8 F (36.6 C) (Oral)  Resp 14  SpO2 100%  Physical Exam  Nursing note and vitals reviewed. Constitutional: She is oriented to person, place, and time. She appears well-developed and well-nourished.       Anxious appearing, obese  HENT:  Head: Normocephalic and atraumatic.  Right Ear: External ear normal.  Left Ear: External ear normal.  Nose: Nose normal.  Mouth/Throat: Oropharynx is clear and moist.  Eyes: Conjunctivae and EOM are normal. Pupils are equal, round, and reactive to light.  Neck: Normal range of motion. Neck supple. No JVD present. No tracheal deviation present. No thyromegaly present.  Cardiovascular: Normal rate, regular rhythm, normal heart sounds and intact distal pulses.  Exam reveals no gallop and no friction rub.   No murmur heard. Pulmonary/Chest: Effort normal and breath sounds normal. No stridor. No respiratory  distress. She has no wheezes. She has no rales. She exhibits no tenderness.  Abdominal: Soft. Bowel sounds are normal. She exhibits no distension and no mass. There is no tenderness. There is no rebound and no guarding.  Genitourinary:       Patient status post hysterectomy. Blood noted in the vaginal vault without visualize source of bleeding. It appears to be coming from surgical cuff site although I cannot pinpoint an exact area  Musculoskeletal: Normal range of motion. She exhibits no edema and no tenderness.  Lymphadenopathy:    She has no cervical adenopathy.  Neurological: She is oriented to person, place, and time. She has normal reflexes. No cranial nerve deficit. She exhibits normal muscle tone. Coordination normal.  Skin: Skin is dry. No rash noted. No erythema. No pallor.  Psychiatric: She has a normal mood and affect. Her behavior is normal. Judgment and thought content normal.    ED Course  Procedures (including critical care time)  Labs Reviewed  CBC - Abnormal; Notable for the following:    WBC 11.2 (*)    All other components within normal limits  DIFFERENTIAL - Abnormal; Notable for the following:    Neutro Abs 8.6 (*)    All other components within normal limits  URINALYSIS, ROUTINE W REFLEX MICROSCOPIC  SAMPLE TO BLOOD BANK  URINE CULTURE   No results found.   No diagnosis found.    MDM  50 year old female with reported hematuria, dizziness concern for loss of blood. Urine here without signs of infection or hematuria. Hemoglobin and hematocrit are normal. Pelvic exam shows streaks of blood in her vagina without discrete source. Will have patient followup with both urology and gynecology      Olivia Mackie, MD 12/25/10 [phone]

## 2010-12-25 NOTE — ED Notes (Signed)
I have blood in my urine since OCt 7.  Offers no c/o pain,nausea vomiting, diarrhea, fever or chills

## 2010-12-26 LAB — URINE CULTURE
Colony Count: NO GROWTH
Culture  Setup Time: 201211140608
Culture: NO GROWTH

## 2010-12-31 ENCOUNTER — Other Ambulatory Visit (HOSPITAL_COMMUNITY): Payer: Self-pay | Admitting: Urology

## 2010-12-31 ENCOUNTER — Ambulatory Visit: Payer: Self-pay | Admitting: Urology

## 2010-12-31 ENCOUNTER — Other Ambulatory Visit (HOSPITAL_COMMUNITY): Payer: Self-pay | Admitting: *Deleted

## 2010-12-31 DIAGNOSIS — N3289 Other specified disorders of bladder: Secondary | ICD-10-CM

## 2011-01-06 ENCOUNTER — Telehealth: Payer: Self-pay

## 2011-01-06 NOTE — Telephone Encounter (Signed)
Not sure exactly what kind of CT and for what reason this would be for  Please have pt call [facility] to have them let me know verbally or written what they would want

## 2011-01-06 NOTE — Telephone Encounter (Signed)
Called the patient informed of MD's instructions. 

## 2011-01-06 NOTE — Telephone Encounter (Signed)
The patient did see a urologist in [place].  The urologist would like a CT scheduled, but the patient would like to schedule this in [place] at [facility]. [facility] informed would need to have PCP order the CT at [facility]. Call back number is [phone]

## 2011-01-07 ENCOUNTER — Other Ambulatory Visit (HOSPITAL_COMMUNITY): Payer: Self-pay | Admitting: *Deleted

## 2011-01-07 ENCOUNTER — Other Ambulatory Visit: Payer: Self-pay | Admitting: Gynecology

## 2011-01-07 DIAGNOSIS — R928 Other abnormal and inconclusive findings on diagnostic imaging of breast: Secondary | ICD-10-CM

## 2011-01-07 DIAGNOSIS — N3289 Other specified disorders of bladder: Secondary | ICD-10-CM

## 2011-01-09 ENCOUNTER — Ambulatory Visit (HOSPITAL_COMMUNITY)
Admission: RE | Admit: 2011-01-09 | Discharge: 2011-01-09 | Disposition: A | Discharge status: Home / Self Care | Payer: No Typology Code available for payment source | Source: Ambulatory Visit | Attending: Internal Medicine | Admitting: Internal Medicine

## 2011-01-09 DIAGNOSIS — N3289 Other specified disorders of bladder: Secondary | ICD-10-CM

## 2011-01-09 DIAGNOSIS — N200 Calculus of kidney: Secondary | ICD-10-CM | POA: Insufficient documentation

## 2011-01-09 DIAGNOSIS — D494 Neoplasm of unspecified behavior of bladder: Secondary | ICD-10-CM | POA: Insufficient documentation

## 2011-01-09 DIAGNOSIS — Z90710 Acquired absence of both cervix and uterus: Secondary | ICD-10-CM | POA: Insufficient documentation

## 2011-01-09 DIAGNOSIS — K5730 Diverticulosis of large intestine without perforation or abscess without bleeding: Secondary | ICD-10-CM | POA: Insufficient documentation

## 2011-01-09 DIAGNOSIS — D1803 Hemangioma of intra-abdominal structures: Secondary | ICD-10-CM | POA: Insufficient documentation

## 2011-01-09 MED ORDER — IOHEXOL 300 MG/ML  SOLN
100.0000 mL | Freq: Once | INTRAMUSCULAR | Status: AC | PRN
  Administered 2011-01-09: 100 mL via INTRAVENOUS

## 2011-01-10 ENCOUNTER — Telehealth: Payer: Self-pay

## 2011-01-10 ENCOUNTER — Inpatient Hospital Stay (HOSPITAL_COMMUNITY)
Admission: RE | Admit: 2011-01-10 | Discharge: 2011-01-10 | Disposition: A | Discharge status: Home / Self Care | Payer: PRIVATE HEALTH INSURANCE | Source: Ambulatory Visit | Attending: *Deleted | Admitting: *Deleted

## 2011-01-10 NOTE — Telephone Encounter (Signed)
No definite mass, and a small left renal stone noted (not a problem at this time)  Please ask pt name of her urologist and fax these results

## 2011-01-10 NOTE — Telephone Encounter (Signed)
Called the patient left message to call back 

## 2011-01-10 NOTE — Telephone Encounter (Signed)
Patient called requesting recent CT results

## 2011-01-13 NOTE — Telephone Encounter (Signed)
Called the patient informed of results and faxed to MD in [place].

## 2011-01-16 ENCOUNTER — Ambulatory Visit
Admission: RE | Admit: 2011-01-16 | Discharge: 2011-01-16 | Disposition: A | Discharge status: Home / Self Care | Payer: PRIVATE HEALTH INSURANCE | Source: Ambulatory Visit | Attending: Gynecology | Admitting: Gynecology

## 2011-01-16 DIAGNOSIS — R928 Other abnormal and inconclusive findings on diagnostic imaging of breast: Secondary | ICD-10-CM

## 2011-01-22 ENCOUNTER — Other Ambulatory Visit: Payer: Self-pay | Admitting: Gynecology

## 2011-01-22 ENCOUNTER — Other Ambulatory Visit: Payer: Self-pay | Admitting: Diagnostic Radiology

## 2011-01-22 ENCOUNTER — Ambulatory Visit
Admission: RE | Admit: 2011-01-22 | Discharge: 2011-01-22 | Disposition: A | Discharge status: Home / Self Care | Payer: PRIVATE HEALTH INSURANCE | Source: Ambulatory Visit | Attending: Gynecology | Admitting: Gynecology

## 2011-01-22 DIAGNOSIS — R928 Other abnormal and inconclusive findings on diagnostic imaging of breast: Secondary | ICD-10-CM

## 2011-01-30 ENCOUNTER — Other Ambulatory Visit: Payer: Self-pay | Admitting: Gastroenterology

## 2011-01-30 DIAGNOSIS — K921 Melena: Secondary | ICD-10-CM

## 2011-01-30 DIAGNOSIS — K5792 Diverticulitis of intestine, part unspecified, without perforation or abscess without bleeding: Secondary | ICD-10-CM

## 2011-02-03 ENCOUNTER — Ambulatory Visit (HOSPITAL_COMMUNITY)
Admission: RE | Admit: 2011-02-03 | Discharge: 2011-02-03 | Disposition: A | Discharge status: Home / Self Care | Payer: No Typology Code available for payment source | Source: Ambulatory Visit | Attending: Gastroenterology | Admitting: Gastroenterology

## 2011-02-03 DIAGNOSIS — K5792 Diverticulitis of intestine, part unspecified, without perforation or abscess without bleeding: Secondary | ICD-10-CM

## 2011-02-03 DIAGNOSIS — K5730 Diverticulosis of large intestine without perforation or abscess without bleeding: Secondary | ICD-10-CM | POA: Insufficient documentation

## 2011-02-03 DIAGNOSIS — K921 Melena: Secondary | ICD-10-CM

## 2011-02-03 MED ORDER — IOHEXOL 300 MG/ML  SOLN
800.0000 mL | Freq: Once | INTRAMUSCULAR | Status: AC | PRN
  Administered 2011-02-03: 800 mL

## 2011-02-05 ENCOUNTER — Other Ambulatory Visit: Payer: PRIVATE HEALTH INSURANCE

## 2011-02-06 ENCOUNTER — Other Ambulatory Visit: Payer: PRIVATE HEALTH INSURANCE

## 2011-02-10 ENCOUNTER — Ambulatory Visit (INDEPENDENT_AMBULATORY_CARE_PROVIDER_SITE_OTHER): Payer: PRIVATE HEALTH INSURANCE | Admitting: Internal Medicine

## 2011-02-10 ENCOUNTER — Other Ambulatory Visit: Payer: Self-pay | Admitting: Internal Medicine

## 2011-02-10 ENCOUNTER — Other Ambulatory Visit (INDEPENDENT_AMBULATORY_CARE_PROVIDER_SITE_OTHER): Payer: PRIVATE HEALTH INSURANCE

## 2011-02-10 ENCOUNTER — Telehealth: Payer: Self-pay | Admitting: Critical Care Medicine

## 2011-02-10 ENCOUNTER — Encounter: Payer: Self-pay | Admitting: Internal Medicine

## 2011-02-10 VITALS — BP 118/72 | HR 80 | Temp 97.7°F | Ht 61.0 in | Wt 235.0 lb

## 2011-02-10 DIAGNOSIS — F32A Depression, unspecified: Secondary | ICD-10-CM | POA: Insufficient documentation

## 2011-02-10 DIAGNOSIS — F329 Major depressive disorder, single episode, unspecified: Secondary | ICD-10-CM

## 2011-02-10 DIAGNOSIS — F3289 Other specified depressive episodes: Secondary | ICD-10-CM

## 2011-02-10 DIAGNOSIS — R319 Hematuria, unspecified: Secondary | ICD-10-CM

## 2011-02-10 DIAGNOSIS — Z0000 Encounter for general adult medical examination without abnormal findings: Secondary | ICD-10-CM

## 2011-02-10 DIAGNOSIS — K635 Polyp of colon: Secondary | ICD-10-CM | POA: Insufficient documentation

## 2011-02-10 DIAGNOSIS — L259 Unspecified contact dermatitis, unspecified cause: Secondary | ICD-10-CM

## 2011-02-10 DIAGNOSIS — L309 Dermatitis, unspecified: Secondary | ICD-10-CM

## 2011-02-10 LAB — URINALYSIS, ROUTINE W REFLEX MICROSCOPIC
Bilirubin Urine: NEGATIVE
Hgb urine dipstick: NEGATIVE
Ketones, ur: NEGATIVE
Leukocytes, UA: NEGATIVE
Nitrite: NEGATIVE
Specific Gravity, Urine: >=1.03 (ref 1.000–1.030)
Total Protein, Urine: NEGATIVE
Urine Glucose: NEGATIVE
Urobilinogen, UA: 0.2 (ref 0.0–1.0)
pH: 5.5 (ref 5.0–8.0)

## 2011-02-10 LAB — HEPATIC FUNCTION PANEL
ALT: 27 U/L (ref 0–35)
AST: 25 U/L (ref 0–37)
Albumin: 3.8 g/dL (ref 3.5–5.2)
Alkaline Phosphatase: 84 U/L (ref 39–117)
Bilirubin, Direct: 0.1 mg/dL (ref 0.0–0.3)
Total Bilirubin: 0.5 mg/dL (ref 0.3–1.2)
Total Protein: 7.6 g/dL (ref 6.0–8.3)

## 2011-02-10 LAB — BASIC METABOLIC PANEL
BUN: 23 mg/dL (ref 6–23)
CO2: 27 mEq/L (ref 19–32)
Calcium: 9.5 mg/dL (ref 8.4–10.5)
Chloride: 102 mEq/L (ref 96–112)
Creatinine, Ser: 0.8 mg/dL (ref 0.4–1.2)
GFR: 94.95 mL/min (ref >60.00)
Glucose, Bld: 91 mg/dL (ref 70–99)
Potassium: 3.4 mEq/L — ABNORMAL LOW (ref 3.5–5.1)
Sodium: 139 mEq/L (ref 135–145)

## 2011-02-10 LAB — CBC WITH DIFFERENTIAL/PLATELET
Basophils Absolute: 0 10*3/uL (ref 0.0–0.1)
Basophils Relative: 0.4 % (ref 0.0–3.0)
Eosinophils Absolute: 0.2 10*3/uL (ref 0.0–0.7)
Eosinophils Relative: 1.4 % (ref 0.0–5.0)
HCT: 43.1 % (ref 36.0–46.0)
Hemoglobin: 14.4 g/dL (ref 12.0–15.0)
Lymphocytes Relative: 20.2 % (ref 12.0–46.0)
Lymphs Abs: 2.2 10*3/uL (ref 0.7–4.0)
MCHC: 33.5 g/dL (ref 30.0–36.0)
MCV: 90.2 fl (ref 78.0–100.0)
Monocytes Absolute: 0.7 10*3/uL (ref 0.1–1.0)
Monocytes Relative: 6.5 % (ref 3.0–12.0)
Neutro Abs: 7.9 10*3/uL — ABNORMAL HIGH (ref 1.4–7.7)
Neutrophils Relative %: 71.5 % (ref 43.0–77.0)
Platelets: 263 10*3/uL (ref 150.0–400.0)
RBC: 4.77 Mil/uL (ref 3.87–5.11)
RDW: 14.7 % — ABNORMAL HIGH (ref 11.5–14.6)
WBC: 11 10*3/uL — ABNORMAL HIGH (ref 4.5–10.5)

## 2011-02-10 LAB — TSH: TSH: 1.59 u[IU]/mL (ref 0.35–5.50)

## 2011-02-10 LAB — LDL CHOLESTEROL, DIRECT: Direct LDL: 123.3 mg/dL

## 2011-02-10 LAB — LIPID PANEL
Cholesterol: 242 mg/dL — ABNORMAL HIGH (ref 0–200)
HDL: 96.9 mg/dL (ref >39.00)
Total CHOL/HDL Ratio: 2
Triglycerides: 45 mg/dL (ref 0.0–149.0)
VLDL: 9 mg/dL (ref 0.0–40.0)

## 2011-02-10 MED ORDER — LORAZEPAM 0.5 MG PO TABS: 0.5000 mg | ORAL_TABLET | Freq: Two times a day (BID) | ORAL | Status: AC | PRN

## 2011-02-10 MED ORDER — FLUOCINONIDE 0.05 % EX CREA: TOPICAL_CREAM | Freq: Two times a day (BID) | CUTANEOUS | Status: DC

## 2011-02-10 NOTE — Progress Notes (Signed)
Subjective:    Patient ID: [name], female    DOB: [birth year], 50 y.o.   MRN: [id number]  HPI  Here for wellness and f/u;  Overall doing ok;  Pt denies CP, worsening SOB, DOE, wheezing, orthopnea, PND, worsening LE edema, palpitations, dizziness or syncope.  Pt denies neurological change such as new Headache, facial or extremity weakness.  Pt denies polydipsia, polyuria, or low sugar symptoms. Pt states overall good compliance with treatment and medications, good tolerability, and trying to follow lower cholesterol diet.  Pt denies worsening depressive symptoms, suicidal ideation or panic. No fever, night sweats, loss of appetite, or other constitutional symptoms except has lost wt 247 to 235 with recent stress.  Pt states good ability with ADL's, low fall risk, home safety reviewed and adequate, no significant changes in hearing or vision, and occasionally active with exercise.  Since seen last, tx for UTI in Aug 2012, since then has had some vaginal spotting since sept 2012, s/p TAH 1994, no pain but feeling of air and even moucosy brownish d/c - ? Stool.  Did see urology with renal u/s and cystoscopy and exam but neg evaluation;  Then saw GYN with CT abd/pelvis with contrast approx 1 mo ago negative as well as pelvic exam and pap smear neg;  Did have hemoccult card + for blood, so referred to GI with colonsocpy with 2 small polyp and rec'd f/u in 5 yrs, also had barium enema neg for abnormal.   Despite neg evaluation she is asking for f/u lab work such as urine test.  Past Medical History  Diagnosis Date  . COPD (chronic obstructive pulmonary disease)   . Hypertension   . Disorder of vocal cord   . Asthma   . Obesity (BMI 30-39.9)   . GERD (gastroesophageal reflux disease)   . Sinusitis   . Alcohol abuse   . DJD (degenerative joint disease)     right knee, mot to severe  . Anxiety   . Panic   . History of colon polyps   . History of colonic diverticulitis   . ANXIETY 04/05/2010  .  ASTHMA 04/24/2007  . COLONIC POLYPS, HX OF 04/05/2010  . DIVERTICULITIS, HX OF 04/05/2010  . GERD 04/24/2007  . HYPERTENSION 04/24/2007  . OBESITY 04/24/2007  . PALPITATIONS, HX OF 09/14/2007  . VOCAL CORD DISORDER 04/24/2007  . Depression 02/10/2011   Past Surgical History  Procedure Date  . Vesicovaginal fistula closure w/ tah   . Abdominal hysterectomy     reports that she quit smoking about 23 years ago. Her smoking use included Cigarettes. She has a 3.5 pack-year smoking history. She has never used smokeless tobacco. She reports that she drinks alcohol. She reports that she does not use illicit drugs. family history includes Lymphoma in her sister and Stroke in her mother. Allergies  Allergen Reactions  . Morphine Itching   Current Outpatient Prescriptions on File Prior to Visit  Medication Sig Dispense Refill  . ADVAIR DISKUS 250-50 MCG/DOSE AEPB USE 1 PUFF 2 TIMES A DAY  1 each  6  . ibuprofen (ADVIL,MOTRIN) 800 MG tablet Take 800 mg by mouth daily.        Marland Kitchen losartan-hydrochlorothiazide (HYZAAR) 100-25 MG per tablet Take 1 tablet by mouth daily.  90 tablet  3  . pantoprazole (PROTONIX) 40 MG tablet Take 40 mg by mouth daily.        Marland Kitchen albuterol (PROAIR HFA) 108 (90 BASE) MCG/ACT inhaler Inhale 2 puffs into the  lungs every 4 (four) hours as needed. For breathing       Review of Systems Review of Systems  Constitutional: Negative for diaphoresis, activity change, appetite change and unexpected weight change.  HENT: Negative for hearing loss, ear pain, facial swelling, mouth sores and neck stiffness.   Eyes: Negative for pain, redness and visual disturbance.  Respiratory: Negative for shortness of breath and wheezing.   Cardiovascular: Negative for chest pain and palpitations.  Gastrointestinal: Negative for diarrhea, blood in stool, abdominal distention and rectal pain.  Genitourinary: Negative for hematuria, flank pain and decreased urine volume.  Musculoskeletal: Negative for  myalgias and joint swelling.  Skin: Negative for color change and wound.  Neurological: Negative for syncope and numbness.  Hematological: Negative for adenopathy.  Psychiatric/Behavioral: Negative for hallucinations, self-injury, decreased concentration and agitation.      Objective:   Physical Exam BP 118/72  Pulse 80  Temp(Src) 97.7 F (36.5 C) (Oral)  Ht 5\' 1"  (1.549 m)  Wt 235 lb (106.595 kg)  BMI 44.40 kg/m2  SpO2 98% Physical Exam  VS noted Constitutional: Pt is oriented to person, place, and time. Appears well-developed and well-nourished. /morbid obese HENT:  Head: Normocephalic and atraumatic.  Right Ear: External ear normal.  Left Ear: External ear normal.  Nose: Nose normal.  Mouth/Throat: Oropharynx is clear and moist.  Eyes: Conjunctivae and EOM are normal. Pupils are equal, round, and reactive to light.  Neck: Normal range of motion. Neck supple. No JVD present. No tracheal deviation present.  Cardiovascular: Normal rate, regular rhythm, normal heart sounds and intact distal pulses.   Pulmonary/Chest: Effort normal and breath sounds normal.  Abdominal: Soft. Bowel sounds are normal. There is no tenderness.  Musculoskeletal: Normal range of motion. Exhibits no edema.  Lymphadenopathy:  Has no cervical adenopathy.  Neurological: Pt is alert and oriented to person, place, and time. Pt has normal reflexes. No cranial nerve deficit.  Skin: Skin is warm and dry. No rash noted. except several areas eczema to arms Psychiatric:  Has  normal mood and affect. Behavior is normal. except for 1-2+ nervous, mild dysphoric    Assessment & Plan:

## 2011-02-10 NOTE — Telephone Encounter (Signed)
I spoke with pt and advised her we did not have any albuterol samples. She voiced her understanding and needed nothing further

## 2011-02-10 NOTE — Patient Instructions (Signed)
Continue all other medications as before Please go to LAB in the Basement for the blood and/or urine tests to be done today Please call the phone number [phone] (the PhoneTree System) for results of testing in 2-3 days;  When calling, simply dial the number, and when prompted enter the MRN number above (the Medical Record Number) and the # key, then the message should start. Please return in 1 year for your yearly visit, or sooner if needed, with Lab testing done 3-5 days before  

## 2011-02-11 ENCOUNTER — Encounter: Payer: Self-pay | Admitting: Internal Medicine

## 2011-02-11 NOTE — Assessment & Plan Note (Signed)
Mild, with anxiety, delcines counseling or SSRI trial at this time

## 2011-02-11 NOTE — Assessment & Plan Note (Signed)
Mild, for refill steroid cream prn,  to f/u any worsening symptoms or concerns

## 2011-02-11 NOTE — Assessment & Plan Note (Signed)
Per pt history, though although she is adamant her mucous/blood is vaginal in nature, I question whether it is rectal; will check urine studies per pt request but exam o/w benign and no further eval likely needed due to extensive evaluation so far

## 2011-02-11 NOTE — Assessment & Plan Note (Signed)

## 2011-02-12 LAB — URINE CULTURE
Colony Count: NO GROWTH
Organism ID, Bacteria: NO GROWTH

## 2011-02-13 ENCOUNTER — Telehealth: Payer: Self-pay | Admitting: Critical Care Medicine

## 2011-02-13 MED ORDER — ALBUTEROL SULFATE HFA 108 (90 BASE) MCG/ACT IN AERS: 2.0000 | INHALATION_SPRAY | RESPIRATORY_TRACT | Status: DC | PRN

## 2011-02-13 NOTE — Telephone Encounter (Signed)
I spoke with pt and advised her we did not have any albuterol samples. Pt then stated then she would liek [facility] [address]. i have sent rx for pt. Nothing further was needed

## 2011-04-15 ENCOUNTER — Telehealth: Payer: Self-pay | Admitting: Critical Care Medicine

## 2011-04-15 NOTE — Telephone Encounter (Signed)
1 sample of Ventolin left at front desk for pt to pick up.  Pt aware

## 2011-04-22 ENCOUNTER — Encounter: Payer: Self-pay | Admitting: Internal Medicine

## 2011-04-23 ENCOUNTER — Telehealth: Payer: Self-pay | Admitting: Critical Care Medicine

## 2011-04-23 NOTE — Telephone Encounter (Signed)
Pt returned call. Kathleen W Perdue  

## 2011-04-23 NOTE — Telephone Encounter (Signed)
Pt has not been seen by Dr. Delford Field since 2011 and will need OV or contact her PCP. LMOMTCB x 1.

## 2011-04-23 NOTE — Telephone Encounter (Signed)
Spoke with pt. I advised since last seen 2011, will need ov for eval of her symptoms. Offered appt with TP and she refused, stating that she only wants to see PW. I advised that PW has no openings this wk, and she states in this case will call PCP for an appt and call later to schedule followup with PW since last seen 2011.

## 2011-04-30 ENCOUNTER — Telehealth: Payer: Self-pay | Admitting: Critical Care Medicine

## 2011-04-30 ENCOUNTER — Ambulatory Visit (INDEPENDENT_AMBULATORY_CARE_PROVIDER_SITE_OTHER): Payer: PRIVATE HEALTH INSURANCE | Admitting: Internal Medicine

## 2011-04-30 ENCOUNTER — Encounter: Payer: Self-pay | Admitting: Internal Medicine

## 2011-04-30 VITALS — BP 120/78 | HR 88 | Temp 98.2°F | Ht 61.0 in | Wt 237.5 lb

## 2011-04-30 DIAGNOSIS — I10 Essential (primary) hypertension: Secondary | ICD-10-CM

## 2011-04-30 DIAGNOSIS — N939 Abnormal uterine and vaginal bleeding, unspecified: Secondary | ICD-10-CM | POA: Insufficient documentation

## 2011-04-30 DIAGNOSIS — R062 Wheezing: Secondary | ICD-10-CM | POA: Insufficient documentation

## 2011-04-30 DIAGNOSIS — J209 Acute bronchitis, unspecified: Secondary | ICD-10-CM

## 2011-04-30 DIAGNOSIS — N898 Other specified noninflammatory disorders of vagina: Secondary | ICD-10-CM

## 2011-04-30 MED ORDER — AZITHROMYCIN 250 MG PO TABS: ORAL_TABLET | ORAL | Status: AC

## 2011-04-30 MED ORDER — HYDROCOD POLST-CHLORPHEN POLST 10-8 MG/5ML PO LQCR: 5.0000 mL | Freq: Two times a day (BID) | ORAL | Status: DC | PRN

## 2011-04-30 NOTE — Patient Instructions (Addendum)
Take all new medications as prescribed - the antbiotic, and the cough medicine Please also take the prednisone 10 mg per day for 7 days (call if you do not have enough for prescription) Continue all other medications as before, including the inhalers, and the estrogen cream Please keep your appointments with your specialists as you have planned - Dr Delford Field

## 2011-04-30 NOTE — Assessment & Plan Note (Signed)
Pt has leftover prednisone at home; very mild today; for 10 mg qd for 7 days

## 2011-04-30 NOTE — Telephone Encounter (Signed)
Late add - pt stated ok to leave message on voice mail.

## 2011-04-30 NOTE — Assessment & Plan Note (Addendum)
Chronic mild persistent s/p Very extensive evaluation;  Has vag atrophy now on estrogen cream,Continue all other medications as before

## 2011-04-30 NOTE — Telephone Encounter (Signed)
I spoke with pt and she is scheduled to see TP 05/01/11 at 11:15. Pt needed nothing further

## 2011-04-30 NOTE — Telephone Encounter (Signed)
She needs an OV.  No telephone medicine

## 2011-04-30 NOTE — Telephone Encounter (Signed)
Called spoke with patient who c/o prod cough with dark green mucus in the AM turning yellow throughout the day, tightness in chest and wheezing x 1 week - denies f/c/s.  Patient requesting cough syrup and abx.  Last seen by PEW 3.28.12, f/u in 6 months.  Upcoming appt with PEW 4.3.13.  Declined ov with TP.  [facility].   Allergies  Allergen Reactions  . Morphine Itching   Dr Delford Field please advise, thanks.  Page sent to PEW.

## 2011-05-01 ENCOUNTER — Ambulatory Visit: Payer: PRIVATE HEALTH INSURANCE | Admitting: Adult Health

## 2011-05-04 ENCOUNTER — Encounter: Payer: Self-pay | Admitting: Internal Medicine

## 2011-05-04 NOTE — Progress Notes (Signed)
Subjective:    Patient ID: [name], female    DOB: [birth year], 51 y.o.   MRN: [id number]  HPI   Here with acute onset mild to mod 2-3 days ST, HA, general weakness and malaise, with prod cough greenish sputum, but Pt denies chest pain, increased sob or doe, wheezing, orthopnea, PND, increased LE swelling, palpitations, dizziness or syncope, except for onset wheezing mild yesterday.  Pt denies new neurological symptoms such as new headache, or facial or extremity weakness or numbness   Pt denies polydipsia, polyuria.  Mentions again her ongoign mild persistent frustrating ongoing vaginal spotting after extensive GYN, GI and GU evaluations with the finding per pt only of vaginal atrophy.   Past Medical History  Diagnosis Date  . COPD (chronic obstructive pulmonary disease)   . Hypertension   . Disorder of vocal cord   . Asthma   . Obesity (BMI 30-39.9)   . GERD (gastroesophageal reflux disease)   . Sinusitis   . Alcohol abuse   . DJD (degenerative joint disease)     right knee, mot to severe  . Anxiety   . Panic   . History of colon polyps   . History of colonic diverticulitis   . ANXIETY 04/05/2010  . ASTHMA 04/24/2007  . COLONIC POLYPS, HX OF 04/05/2010  . DIVERTICULITIS, HX OF 04/05/2010  . GERD 04/24/2007  . HYPERTENSION 04/24/2007  . OBESITY 04/24/2007  . PALPITATIONS, HX OF 09/14/2007  . VOCAL CORD DISORDER 04/24/2007  . Depression 02/10/2011   Past Surgical History  Procedure Date  . Vesicovaginal fistula closure w/ tah   . Abdominal hysterectomy     reports that she quit smoking about 23 years ago. Her smoking use included Cigarettes. She has a 3.5 pack-year smoking history. She has never used smokeless tobacco. She reports that she drinks alcohol. She reports that she does not use illicit drugs. family history includes Lymphoma in her sister and Stroke in her mother. Allergies  Allergen Reactions  . Morphine Itching   Current Outpatient Prescriptions on File Prior to  Visit  Medication Sig Dispense Refill  . ADVAIR DISKUS 250-50 MCG/DOSE AEPB USE 1 PUFF 2 TIMES A DAY  1 each  6  . albuterol (PROAIR HFA) 108 (90 BASE) MCG/ACT inhaler Inhale 2 puffs into the lungs every 4 (four) hours as needed. For breathing  1 Inhaler  1  . fluocinonide cream (LIDEX) 0.05 % Apply topically 2 (two) times daily.  30 g  1  . ibuprofen (ADVIL,MOTRIN) 800 MG tablet Take 800 mg by mouth daily.        Marland Kitchen losartan-hydrochlorothiazide (HYZAAR) 100-25 MG per tablet Take 1 tablet by mouth daily.  90 tablet  3  . pantoprazole (PROTONIX) 40 MG tablet Take 40 mg by mouth daily.         Review of Systems Review of Systems  Constitutional: Negative for diaphoresis and unexpected weight change.  HENT: Negative for drooling and tinnitus.   Eyes: Negative for photophobia and visual disturbance.  Respiratory: Negative for choking and stridor.   Gastrointestinal: Negative for vomiting and blood in stool.  Genitourinary: Negative for hematuria and decreased urine volume.  Musculoskeletal: Negative for gait problem.  Objective:   Physical Exam BP 120/78  Pulse 88  Temp(Src) 98.2 F (36.8 C) (Oral)  Ht 5\' 1"  (1.549 m)  Wt 237 lb 8 oz (107.729 kg)  BMI 44.88 kg/m2  SpO2 96% Physical Exam  VS noted, mild ill appearing Constitutional: Pt appears well-developed  and well-nourished.  HENT: Head: Normocephalic.  Right Ear: External ear normal.  Left Ear: External ear normal.  Bilat tm's mild erythema.  Sinus nontender.  Pharynx mild erythema Eyes: Conjunctivae and EOM are normal. Pupils are equal, round, and reactive to light.  Neck: Normal range of motion. Neck supple.  Cardiovascular: Normal rate and regular rhythm.   Pulmonary/Chest: Effort normal and breath sounds mild decreased BS with mild bilat wheeze.  Abd:  Soft, NT, non-distended, + BS Skin: Skin is warm. No erythema.  Psychiatric: Pt behavior is normal. Thought content normal.1+ nervous     Assessment & Plan:

## 2011-05-04 NOTE — Assessment & Plan Note (Signed)
stable overall by hx and exam, most recent data reviewed with pt, and pt to continue medical treatment as before  BP Readings from Last 3 Encounters:  04/30/11 120/78  02/10/11 118/72  12/25/10 159/97

## 2011-05-04 NOTE — Assessment & Plan Note (Signed)
Mild to mod, for antibx course,  to f/u any worsening symptoms or concerns 

## 2011-05-05 ENCOUNTER — Telehealth: Payer: Self-pay | Admitting: Critical Care Medicine

## 2011-05-05 NOTE — Telephone Encounter (Signed)
LMTCB

## 2011-05-05 NOTE — Telephone Encounter (Signed)
Pt return call °

## 2011-05-05 NOTE — Telephone Encounter (Signed)
lmomtcb x1 

## 2011-05-05 NOTE — Telephone Encounter (Signed)
Pt returned triage's call.  Holly D Pryor ° °

## 2011-05-05 NOTE — Telephone Encounter (Signed)
Spoke with pt and notified that we have no rescue inhalers at this time. Offered to send a refill on her proair and she declined, stating that she already has rx for this. Pt states nothing further needed now. Will keep planned ov.

## 2011-05-05 NOTE — Telephone Encounter (Signed)
LMOMTCB x 1.  There are no rescue inhaler samples available.

## 2011-05-14 ENCOUNTER — Ambulatory Visit: Payer: PRIVATE HEALTH INSURANCE | Admitting: Critical Care Medicine

## 2011-06-04 ENCOUNTER — Telehealth: Payer: Self-pay | Admitting: Critical Care Medicine

## 2011-06-04 NOTE — Telephone Encounter (Signed)
lmomtcb x1 

## 2011-06-05 NOTE — Telephone Encounter (Signed)
LMOMTCB - no albuterol samples at this time.   Note:  Pt was last seen by Dr. Delford Field 05/08/10.

## 2011-06-06 NOTE — Telephone Encounter (Signed)
lmomtcb  

## 2011-06-09 NOTE — Telephone Encounter (Signed)
lmomtcb asking to pls call back and leave a new msg for triage nurse if anything further is needed regarding this msg.  Per triage protocol, will sign off on this msg as we have left 3 VM for pt.

## 2011-07-04 ENCOUNTER — Telehealth: Payer: Self-pay | Admitting: Critical Care Medicine

## 2011-07-04 ENCOUNTER — Other Ambulatory Visit: Payer: Self-pay | Admitting: Critical Care Medicine

## 2011-07-04 MED ORDER — ALBUTEROL SULFATE HFA 108 (90 BASE) MCG/ACT IN AERS: 2.0000 | INHALATION_SPRAY | RESPIRATORY_TRACT | Status: DC | PRN

## 2011-07-04 NOTE — Telephone Encounter (Signed)
Spoke with pt. She is asking for sample or rx for albuterol rescue inhaler. I advised that I can refill her proair but she must keep upcoming June appt with PW b/c she is overdue for rov. Rx sent and pt states nothing further needed.

## 2011-07-15 ENCOUNTER — Telehealth: Payer: Self-pay

## 2011-07-15 NOTE — Telephone Encounter (Signed)
The patient is having knee replacement and would like advisement on OV needed for clearance.  Call back number for the patient is [phone]

## 2011-07-15 NOTE — Telephone Encounter (Signed)
Form signed to be faxed to ortho - ok for surgury

## 2011-07-16 NOTE — Telephone Encounter (Signed)
Called the patient left detailed message per patient request of MD's surgical clearance.

## 2011-07-22 ENCOUNTER — Encounter (HOSPITAL_COMMUNITY): Payer: Self-pay | Admitting: Pharmacy Technician

## 2011-07-23 ENCOUNTER — Other Ambulatory Visit (HOSPITAL_COMMUNITY): Payer: Self-pay | Admitting: Orthopedic Surgery

## 2011-07-23 ENCOUNTER — Ambulatory Visit (HOSPITAL_COMMUNITY)
Admission: RE | Admit: 2011-07-23 | Discharge: 2011-07-23 | Disposition: A | Discharge status: Home / Self Care | Payer: PRIVATE HEALTH INSURANCE | Source: Ambulatory Visit | Attending: Orthopedic Surgery | Admitting: Orthopedic Surgery

## 2011-07-23 ENCOUNTER — Encounter (HOSPITAL_COMMUNITY): Payer: Self-pay

## 2011-07-23 ENCOUNTER — Encounter (HOSPITAL_COMMUNITY)
Admission: RE | Admit: 2011-07-23 | Discharge: 2011-07-23 | Disposition: A | Discharge status: Home / Self Care | Payer: PRIVATE HEALTH INSURANCE | Source: Ambulatory Visit | Attending: Orthopedic Surgery | Admitting: Orthopedic Surgery

## 2011-07-23 DIAGNOSIS — M79604 Pain in right leg: Secondary | ICD-10-CM

## 2011-07-23 DIAGNOSIS — M79651 Pain in right thigh: Secondary | ICD-10-CM

## 2011-07-23 DIAGNOSIS — M1710 Unilateral primary osteoarthritis, unspecified knee: Secondary | ICD-10-CM | POA: Insufficient documentation

## 2011-07-23 DIAGNOSIS — J45909 Unspecified asthma, uncomplicated: Secondary | ICD-10-CM | POA: Insufficient documentation

## 2011-07-23 DIAGNOSIS — I10 Essential (primary) hypertension: Secondary | ICD-10-CM | POA: Insufficient documentation

## 2011-07-23 DIAGNOSIS — Z01810 Encounter for preprocedural cardiovascular examination: Secondary | ICD-10-CM | POA: Insufficient documentation

## 2011-07-23 DIAGNOSIS — Z01812 Encounter for preprocedural laboratory examination: Secondary | ICD-10-CM | POA: Insufficient documentation

## 2011-07-23 HISTORY — DX: Cardiac murmur, unspecified: R01.1

## 2011-07-23 LAB — URINALYSIS, ROUTINE W REFLEX MICROSCOPIC
Bilirubin Urine: NEGATIVE
Glucose, UA: NEGATIVE mg/dL
Hgb urine dipstick: NEGATIVE
Ketones, ur: NEGATIVE mg/dL
Leukocytes, UA: NEGATIVE
Nitrite: NEGATIVE
Protein, ur: NEGATIVE mg/dL
Specific Gravity, Urine: 1.023 (ref 1.005–1.030)
Urobilinogen, UA: 0.2 mg/dL (ref 0.0–1.0)
pH: 5.5 (ref 5.0–8.0)

## 2011-07-23 LAB — BASIC METABOLIC PANEL
BUN: 16 mg/dL (ref 6–23)
CO2: 29 mEq/L (ref 19–32)
Calcium: 9.8 mg/dL (ref 8.4–10.5)
Chloride: 102 mEq/L (ref 96–112)
Creatinine, Ser: 0.76 mg/dL (ref 0.50–1.10)
GFR calc Af Amer: >90 mL/min (ref >90)
GFR calc non Af Amer: >90 mL/min (ref >90)
Glucose, Bld: 107 mg/dL — ABNORMAL HIGH (ref 70–99)
Potassium: 3 mEq/L — ABNORMAL LOW (ref 3.5–5.1)
Sodium: 140 mEq/L (ref 135–145)

## 2011-07-23 LAB — CBC
HCT: 43.1 % (ref 36.0–46.0)
Hemoglobin: 14.1 g/dL (ref 12.0–15.0)
MCH: 29.4 pg (ref 26.0–34.0)
MCHC: 32.7 g/dL (ref 30.0–36.0)
MCV: 89.8 fL (ref 78.0–100.0)
Platelets: 251 10*3/uL (ref 150–400)
RBC: 4.8 MIL/uL (ref 3.87–5.11)
RDW: 14.2 % (ref 11.5–15.5)
WBC: 10.3 10*3/uL (ref 4.0–10.5)

## 2011-07-23 LAB — SURGICAL PCR SCREEN
MRSA, PCR: NEGATIVE
Staphylococcus aureus: NEGATIVE

## 2011-07-23 LAB — PROTIME-INR
INR: 0.95 (ref 0.00–1.49)
Prothrombin Time: 12.9 seconds (ref 11.6–15.2)

## 2011-07-23 LAB — APTT: aPTT: 26 seconds (ref 24–37)

## 2011-07-23 MED ORDER — CEFAZOLIN SODIUM 1-5 GM-% IV SOLN: 1.0000 g | INTRAVENOUS | Status: DC

## 2011-07-23 NOTE — Pre-Procedure Instructions (Signed)
07/23/11 Paged Freddie Breech, PA and made aware of Potassium 3.0 on preop appointment .  Also fax sent and confirmation received to office of Dr Charlann Boxer regarding Potassium results.

## 2011-07-23 NOTE — Patient Instructions (Addendum)
20 RILDA BULLS  07/23/2011   Your procedure is scheduled on:  07/29/11 540pm-645pm  Report to [facility] at 300pm.  Call this number if you have problems the morning of surgery: [phone]   Remember:   Do not eat food:After Midnight.  May have clear liquids:until 1100am then npo .  Clear liquids include soda, tea, black coffee, apple or grape juice, broth.  Take these medicines the morning of surgery with A SIP OF WATER:    Do not wear jewelry, make-up or nail polish.  Do not wear lotions, powders, or perfumes  Do not shave 48 hours prior to surgery.  Do not bring valuables to the hospital.  Contacts, dentures or bridgework may not be worn into surgery.  Leave suitcase in the car. After surgery it may be brought to your room.  For patients admitted to the hospital, checkout time is 11:00 AM the day of discharge.     Special Instructions: CHG Shower Use Special Wash: 1/2 bottle night before surgery and 1/2 bottle morning of surgery. shower chin to toes with CHG. Wash face and private parts with regular soap.    Please read over the following fact sheets that you were given: MRSA Information, Blood Transfusion Fact sheet, Incentive Spirometry Fact Sheet , coughing and deep breathing exercises, leg exercises

## 2011-07-23 NOTE — Pre-Procedure Instructions (Signed)
Teach Back method used for preop appointment teaching.

## 2011-07-23 NOTE — Pre-Procedure Instructions (Signed)
07/23/11 Nurse spoke with Junious Dresser at office of Dr Charlann Boxer.  Made aware that potassium was 3.0 on preop appointment of 07/23/11.Marland Kitchen  Junious Dresser stated she would notify Dr Charlann Boxer.

## 2011-07-24 ENCOUNTER — Other Ambulatory Visit (HOSPITAL_COMMUNITY): Payer: PRIVATE HEALTH INSURANCE

## 2011-07-24 NOTE — H&P (Signed)
NAME:  [name], [name]               ACCOUNT NO.:  [id number]  MEDICAL RECORD NO.:  [id number]  LOCATION:  [facility]                         FACILITY:  [facility]  PHYSICIAN:  Matthew D. Olin, M.D.  DATE OF BIRTH:  06/28/1960  DATE OF ADMISSION:  07/23/2011 DATE OF DISCHARGE:  07/23/2011                             HISTORY & PHYSICAL   DATE OF SURGERY:  07/29/2011.  ADMITTING DIAGNOSIS:  End-stage osteoarthritis, right knee.  HISTORY OF PRESENT ILLNESS:  This is a 51-year-old lady with a history of end-stage osteoarthritis of her right knee that has failed conservative measures.  After discussion of treatment, benefits, risks, and options, the patient is now scheduled for total knee arthroplasty of the right knee.  Note that her medical doctor is [name].  She is a candidate for trans-Cinnamic acid and dexamethasone, and will receive both at surgery.  She plans on going home after surgery.  She is given her home medicines of aspirin, Robaxin, iron, MiraLax, and Colace to take postoperatively.  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  The surgery, risks, benefits, and aftercare were discussed with the patient, questions invited and answered, and surgery to go ahead as scheduled.  PAST MEDICAL HISTORY:  Drug allergy to MORPHINE with itching.  CURRENT MEDICATIONS: 1. Losartan/hydrochlorothiazide 100/25 one daily. 2. Meloxicam 7.5 mg 1 daily which she will stop a week before surgery. 3. Protonix p.r.n. 4. Advair Diskus 1 inhalation daily. 5. Albuterol inhaler p.r.n. 6. Vicodin 5/325, 1 nightly p.r.n. pain.  PREVIOUS SURGERIES:  Include hysterectomy and left knee arthroscopy.  SERIOUS MEDICAL ILLNESSES:  Include hypertension, reflux, and asthma.  FAMILY HISTORY:  Positive for hypertension and CVA.  SOCIAL HISTORY:  The patient is married.  She is a high-school graduate. She does not smoke  and drinks on the weekends only.  She again plans on going home after surgery.  REVIEW OF SYSTEMS:  CENTRAL NERVOUS SYSTEM:  Positive for anxiety. Negative for headache, blurred vision, or dizziness.  PULMONARY: Positive for asthma and exertional shortness of breath.  Negative for PND and orthopnea.  Negative for sleep apnea.  CARDIOVASCULAR:  Positive for hypertension and history of a heart murmur.  Negative for chest pain or palpitation.  GI:  Positive for diverticulosis and reflux.  Negative for ulcers or hepatitis.  GU:  Negative for urinary tract difficulties. MUSCULOSKELETAL:  Positive as in HPI.  PHYSICAL EXAMINATION:  VITAL SIGNS:  Blood pressure 158/104, will repeat that today and have her see her medical doctor if still elevated.  Pulse 78 and regular, respirations 14. HEENT:  Head normocephalic.  Nose patent.  Ears patent.  Pupils equal, round, and reactive to light.  Throat without injection. NECK:  Supple without adenopathy.  Carotids 2+ without bruit. CHEST:  Clear to auscultation.  No rales or rhonchi.  Respirations 14. HEART:  Regular rate and rhythm at 78 beats per minute without murmur. ABDOMEN:  Soft with active bowel sounds.  No masses or organomegaly. NEUROLOGIC:  The patient is alert and oriented to time, place, and person.  Cranial nerves   II through XII grossly intact. EXTREMITIES:  Show the right knee with a 5-degree flexion contracture, further flexion to 95 degrees.  Trace effusion noted.  Neurovascular status intact.  IMPRESSION:  End-stage osteoarthritis, right knee.  PLAN:  Total knee arthroplasty, right knee.     [name], P.A.   ______________________________ Matthew D. Olin, M.D.    SJC/MEDQ  D:  07/23/2011  T:  07/24/2011  Job:  [id number] 

## 2011-07-25 ENCOUNTER — Inpatient Hospital Stay (HOSPITAL_COMMUNITY): Admission: RE | Admit: 2011-07-25 | Payer: PRIVATE HEALTH INSURANCE | Source: Ambulatory Visit

## 2011-07-28 NOTE — Pre-Procedure Instructions (Signed)
07/28/11 Patient called office of Dr Charlann Boxer and spoke with Clydie Braun on 07/28/11 regarding toenails painted and food after midnite.  I called patient on [phone] and spoke with patient. Nurse instructed patient only light colored polish on toenails.  Patient voiced understanding.  Patient asked nurse if she could have sandwich tomorrow morning prior to surgery.  I reinstructed patient on no solid foods after midnite.  Patient may have clear liquids until 100am on 07/29/11 then npo.  Patient voiced understanding.  Patient has preop instruction sheet and clear liquid diet sheet from preop appointment.

## 2011-07-29 ENCOUNTER — Encounter (HOSPITAL_COMMUNITY)
Admission: RE | Disposition: A | Discharge status: Home Health | Payer: Self-pay | Source: Ambulatory Visit | Attending: Orthopedic Surgery

## 2011-07-29 ENCOUNTER — Ambulatory Visit (HOSPITAL_COMMUNITY): Payer: No Typology Code available for payment source | Admitting: Registered Nurse

## 2011-07-29 ENCOUNTER — Encounter (HOSPITAL_COMMUNITY): Payer: Self-pay | Admitting: Registered Nurse

## 2011-07-29 ENCOUNTER — Encounter (HOSPITAL_COMMUNITY): Payer: Self-pay | Admitting: *Deleted

## 2011-07-29 ENCOUNTER — Inpatient Hospital Stay (HOSPITAL_COMMUNITY)
Admission: RE | Admit: 2011-07-29 | Discharge: 2011-08-01 | DRG: 470 | Disposition: A | Discharge status: Home Health | Payer: No Typology Code available for payment source | Source: Ambulatory Visit | Attending: Orthopedic Surgery | Admitting: Orthopedic Surgery

## 2011-07-29 DIAGNOSIS — I10 Essential (primary) hypertension: Secondary | ICD-10-CM | POA: Diagnosis present

## 2011-07-29 DIAGNOSIS — F3289 Other specified depressive episodes: Secondary | ICD-10-CM | POA: Diagnosis present

## 2011-07-29 DIAGNOSIS — F329 Major depressive disorder, single episode, unspecified: Secondary | ICD-10-CM | POA: Diagnosis present

## 2011-07-29 DIAGNOSIS — F411 Generalized anxiety disorder: Secondary | ICD-10-CM | POA: Diagnosis present

## 2011-07-29 DIAGNOSIS — M1710 Unilateral primary osteoarthritis, unspecified knee: Principal | ICD-10-CM | POA: Diagnosis present

## 2011-07-29 DIAGNOSIS — J449 Chronic obstructive pulmonary disease, unspecified: Secondary | ICD-10-CM | POA: Diagnosis present

## 2011-07-29 DIAGNOSIS — Z96659 Presence of unspecified artificial knee joint: Secondary | ICD-10-CM

## 2011-07-29 DIAGNOSIS — K219 Gastro-esophageal reflux disease without esophagitis: Secondary | ICD-10-CM | POA: Diagnosis present

## 2011-07-29 DIAGNOSIS — J4489 Other specified chronic obstructive pulmonary disease: Secondary | ICD-10-CM | POA: Diagnosis present

## 2011-07-29 DIAGNOSIS — Z6841 Body Mass Index (BMI) 40.0 and over, adult: Secondary | ICD-10-CM

## 2011-07-29 HISTORY — PX: TOTAL KNEE ARTHROPLASTY: SHX125

## 2011-07-29 LAB — ABO/RH: ABO/RH(D): A POS

## 2011-07-29 LAB — TYPE AND SCREEN
ABO/RH(D): A POS
Antibody Screen: NEGATIVE

## 2011-07-29 SURGERY — ARTHROPLASTY, KNEE, TOTAL
Anesthesia: Spinal | Site: Knee | Laterality: Right | Wound class: Clean

## 2011-07-29 MED ORDER — SODIUM CHLORIDE 0.9 % IV SOLN
INTRAVENOUS | Status: AC
  Administered 2011-07-29: 23:00:00 via INTRAVENOUS
  Filled 2011-07-29 (×2): qty 1000

## 2011-07-29 MED ORDER — FLEET ENEMA 7-19 GM/118ML RE ENEM: 1.0000 | ENEMA | Freq: Once | RECTAL | Status: AC | PRN

## 2011-07-29 MED ORDER — LACTATED RINGERS IV SOLN
INTRAVENOUS | Status: DC
  Administered 2011-07-29 (×3): via INTRAVENOUS

## 2011-07-29 MED ORDER — MENTHOL 3 MG MT LOZG: 1.0000 | LOZENGE | OROMUCOSAL | Status: DC | PRN

## 2011-07-29 MED ORDER — METOCLOPRAMIDE HCL 10 MG PO TABS: 5.0000 mg | ORAL_TABLET | Freq: Three times a day (TID) | ORAL | Status: DC | PRN

## 2011-07-29 MED ORDER — MIDAZOLAM HCL 5 MG/5ML IJ SOLN
INTRAMUSCULAR | Status: DC | PRN
  Administered 2011-07-29 (×2): 2 mg via INTRAVENOUS

## 2011-07-29 MED ORDER — HYDROMORPHONE HCL PF 1 MG/ML IJ SOLN: 0.2500 mg | INTRAMUSCULAR | Status: DC | PRN

## 2011-07-29 MED ORDER — METHOCARBAMOL 100 MG/ML IJ SOLN
500.0000 mg | Freq: Four times a day (QID) | INTRAVENOUS | Status: DC | PRN
  Filled 2011-07-29: qty 5

## 2011-07-29 MED ORDER — ONDANSETRON HCL 4 MG/2ML IJ SOLN
INTRAMUSCULAR | Status: DC | PRN
  Administered 2011-07-29: 4 mg via INTRAVENOUS

## 2011-07-29 MED ORDER — FENTANYL CITRATE 0.05 MG/ML IJ SOLN
INTRAMUSCULAR | Status: DC | PRN
  Administered 2011-07-29 (×3): 50 ug via INTRAVENOUS
  Administered 2011-07-29: 100 ug via INTRAVENOUS

## 2011-07-29 MED ORDER — LOSARTAN POTASSIUM-HCTZ 100-25 MG PO TABS: 1.0000 | ORAL_TABLET | Freq: Every day | ORAL | Status: DC

## 2011-07-29 MED ORDER — METOCLOPRAMIDE HCL 5 MG/ML IJ SOLN: 5.0000 mg | Freq: Three times a day (TID) | INTRAMUSCULAR | Status: DC | PRN

## 2011-07-29 MED ORDER — ACETAMINOPHEN 10 MG/ML IV SOLN
INTRAVENOUS | Status: DC | PRN
  Administered 2011-07-29: 1000 mg via INTRAVENOUS

## 2011-07-29 MED ORDER — CELECOXIB 200 MG PO CAPS
200.0000 mg | ORAL_CAPSULE | Freq: Two times a day (BID) | ORAL | Status: DC
  Administered 2011-07-29 – 2011-08-01 (×6): 200 mg via ORAL
  Filled 2011-07-29 (×7): qty 1

## 2011-07-29 MED ORDER — BISACODYL 5 MG PO TBEC: 5.0000 mg | DELAYED_RELEASE_TABLET | Freq: Every day | ORAL | Status: DC | PRN

## 2011-07-29 MED ORDER — CEFAZOLIN SODIUM-DEXTROSE 2-3 GM-% IV SOLR
2.0000 g | Freq: Once | INTRAVENOUS | Status: AC
  Administered 2011-07-29: 2 g via INTRAVENOUS

## 2011-07-29 MED ORDER — HYDROMORPHONE HCL PF 1 MG/ML IJ SOLN: 0.5000 mg | INTRAMUSCULAR | Status: DC | PRN

## 2011-07-29 MED ORDER — KETOROLAC TROMETHAMINE 30 MG/ML IJ SOLN
INTRAMUSCULAR | Status: AC
  Filled 2011-07-29: qty 1

## 2011-07-29 MED ORDER — BUPIVACAINE-EPINEPHRINE PF 0.25-1:200000 % IJ SOLN
INTRAMUSCULAR | Status: AC
  Filled 2011-07-29: qty 30

## 2011-07-29 MED ORDER — PHENOL 1.4 % MT LIQD: 1.0000 | OROMUCOSAL | Status: DC | PRN

## 2011-07-29 MED ORDER — ONDANSETRON HCL 4 MG PO TABS: 4.0000 mg | ORAL_TABLET | Freq: Four times a day (QID) | ORAL | Status: DC | PRN

## 2011-07-29 MED ORDER — ACETAMINOPHEN 10 MG/ML IV SOLN
INTRAVENOUS | Status: AC
  Filled 2011-07-29: qty 100

## 2011-07-29 MED ORDER — ALBUTEROL SULFATE HFA 108 (90 BASE) MCG/ACT IN AERS
INHALATION_SPRAY | RESPIRATORY_TRACT | Status: DC | PRN
  Administered 2011-07-29: 2 via RESPIRATORY_TRACT

## 2011-07-29 MED ORDER — ALBUTEROL SULFATE HFA 108 (90 BASE) MCG/ACT IN AERS
INHALATION_SPRAY | RESPIRATORY_TRACT | Status: AC
  Filled 2011-07-29: qty 6.7

## 2011-07-29 MED ORDER — DEXAMETHASONE SODIUM PHOSPHATE 10 MG/ML IJ SOLN
10.0000 mg | Freq: Once | INTRAMUSCULAR | Status: AC
  Administered 2011-07-29: 10 mg via INTRAVENOUS

## 2011-07-29 MED ORDER — FLUTICASONE-SALMETEROL 250-50 MCG/DOSE IN AEPB
1.0000 | INHALATION_SPRAY | Freq: Two times a day (BID) | RESPIRATORY_TRACT | Status: DC
  Administered 2011-08-01: 1 via RESPIRATORY_TRACT
  Filled 2011-07-29: qty 14

## 2011-07-29 MED ORDER — PROMETHAZINE HCL 25 MG/ML IJ SOLN: 6.2500 mg | INTRAMUSCULAR | Status: DC | PRN

## 2011-07-29 MED ORDER — ONDANSETRON HCL 4 MG/2ML IJ SOLN: 4.0000 mg | Freq: Four times a day (QID) | INTRAMUSCULAR | Status: DC | PRN

## 2011-07-29 MED ORDER — CHLORHEXIDINE GLUCONATE 4 % EX LIQD
60.0000 mL | Freq: Once | CUTANEOUS | Status: DC
  Filled 2011-07-29: qty 60

## 2011-07-29 MED ORDER — DOCUSATE SODIUM 100 MG PO CAPS
100.0000 mg | ORAL_CAPSULE | Freq: Two times a day (BID) | ORAL | Status: DC
  Administered 2011-07-29 – 2011-08-01 (×5): 100 mg via ORAL

## 2011-07-29 MED ORDER — DIPHENHYDRAMINE HCL 25 MG PO CAPS
25.0000 mg | ORAL_CAPSULE | Freq: Four times a day (QID) | ORAL | Status: DC | PRN
  Administered 2011-07-31: 25 mg via ORAL
  Filled 2011-07-29: qty 1

## 2011-07-29 MED ORDER — HYDROCODONE-ACETAMINOPHEN 7.5-325 MG PO TABS
1.0000 | ORAL_TABLET | ORAL | Status: DC
  Administered 2011-07-29 – 2011-07-30 (×2): 2 via ORAL
  Administered 2011-07-30: 1 via ORAL
  Administered 2011-07-30 – 2011-07-31 (×7): 2 via ORAL
  Administered 2011-07-31: 1 via ORAL
  Administered 2011-07-31 – 2011-08-01 (×4): 2 via ORAL
  Filled 2011-07-29 (×15): qty 2

## 2011-07-29 MED ORDER — MIDAZOLAM HCL 2 MG/2ML IJ SOLN
INTRAMUSCULAR | Status: AC
  Filled 2011-07-29: qty 2

## 2011-07-29 MED ORDER — ZOLPIDEM TARTRATE 5 MG PO TABS: 5.0000 mg | ORAL_TABLET | Freq: Every evening | ORAL | Status: DC | PRN

## 2011-07-29 MED ORDER — MIDAZOLAM HCL 2 MG/2ML IJ SOLN
1.0000 mg | INTRAMUSCULAR | Status: DC | PRN
  Administered 2011-07-29: 2 mg via INTRAVENOUS
  Filled 2011-07-29: qty 2

## 2011-07-29 MED ORDER — FERROUS SULFATE 325 (65 FE) MG PO TABS
325.0000 mg | ORAL_TABLET | Freq: Three times a day (TID) | ORAL | Status: DC
  Administered 2011-07-30 – 2011-08-01 (×7): 325 mg via ORAL
  Filled 2011-07-29 (×10): qty 1

## 2011-07-29 MED ORDER — RIVAROXABAN 10 MG PO TABS
10.0000 mg | ORAL_TABLET | ORAL | Status: DC
  Administered 2011-07-30 – 2011-08-01 (×3): 10 mg via ORAL
  Filled 2011-07-29 (×4): qty 1

## 2011-07-29 MED ORDER — KETOROLAC TROMETHAMINE 30 MG/ML IJ SOLN
INTRAMUSCULAR | Status: DC | PRN
  Administered 2011-07-29: 30 mg via INTRAVENOUS

## 2011-07-29 MED ORDER — METHOCARBAMOL 500 MG PO TABS
500.0000 mg | ORAL_TABLET | Freq: Four times a day (QID) | ORAL | Status: DC | PRN
  Administered 2011-07-29 – 2011-07-31 (×6): 500 mg via ORAL
  Filled 2011-07-29 (×6): qty 1

## 2011-07-29 MED ORDER — LOSARTAN POTASSIUM 50 MG PO TABS
100.0000 mg | ORAL_TABLET | Freq: Every day | ORAL | Status: DC
  Administered 2011-07-29 – 2011-08-01 (×4): 100 mg via ORAL
  Filled 2011-07-29 (×4): qty 2

## 2011-07-29 MED ORDER — DEXAMETHASONE SODIUM PHOSPHATE 10 MG/ML IJ SOLN
10.0000 mg | Freq: Once | INTRAMUSCULAR | Status: DC
  Filled 2011-07-29: qty 1

## 2011-07-29 MED ORDER — KETAMINE HCL 10 MG/ML IJ SOLN
INTRAMUSCULAR | Status: DC | PRN
  Administered 2011-07-29 (×4): 20 mg via INTRAVENOUS

## 2011-07-29 MED ORDER — HYDROCHLOROTHIAZIDE 25 MG PO TABS
25.0000 mg | ORAL_TABLET | Freq: Every day | ORAL | Status: DC
  Administered 2011-07-29 – 2011-08-01 (×4): 25 mg via ORAL
  Filled 2011-07-29 (×4): qty 1

## 2011-07-29 MED ORDER — BUPIVACAINE IN DEXTROSE 0.75-8.25 % IT SOLN
INTRATHECAL | Status: DC | PRN
  Administered 2011-07-29: 15 mg via INTRATHECAL

## 2011-07-29 MED ORDER — PROPOFOL 10 MG/ML IV EMUL
INTRAVENOUS | Status: DC | PRN
  Administered 2011-07-29: 50 ug/kg/min via INTRAVENOUS

## 2011-07-29 MED ORDER — CEFAZOLIN SODIUM-DEXTROSE 2-3 GM-% IV SOLR
2.0000 g | Freq: Four times a day (QID) | INTRAVENOUS | Status: AC
  Administered 2011-07-29 – 2011-07-30 (×2): 2 g via INTRAVENOUS
  Filled 2011-07-29 (×2): qty 50

## 2011-07-29 MED ORDER — POLYETHYLENE GLYCOL 3350 17 G PO PACK
17.0000 g | PACK | Freq: Two times a day (BID) | ORAL | Status: DC
  Administered 2011-07-29 – 2011-08-01 (×4): 17 g via ORAL

## 2011-07-29 MED ORDER — FLUTICASONE-SALMETEROL 250-50 MCG/DOSE IN AEPB
1.0000 | INHALATION_SPRAY | Freq: Two times a day (BID) | RESPIRATORY_TRACT | Status: DC
  Filled 2011-07-29: qty 14

## 2011-07-29 MED ORDER — SODIUM CHLORIDE 0.9 % IR SOLN
Status: DC | PRN
  Administered 2011-07-29: 3000 mL

## 2011-07-29 MED ORDER — TRANEXAMIC ACID 100 MG/ML IV SOLN
1600.0000 mg | Freq: Once | INTRAVENOUS | Status: AC
  Administered 2011-07-29: 1600 mg via INTRAVENOUS
  Filled 2011-07-29: qty 16

## 2011-07-29 MED ORDER — ALBUTEROL SULFATE HFA 108 (90 BASE) MCG/ACT IN AERS
2.0000 | INHALATION_SPRAY | RESPIRATORY_TRACT | Status: DC | PRN
  Filled 2011-07-29: qty 6.7

## 2011-07-29 MED ORDER — ALUM & MAG HYDROXIDE-SIMETH 200-200-20 MG/5ML PO SUSP: 30.0000 mL | ORAL | Status: DC | PRN

## 2011-07-29 MED ORDER — CEFAZOLIN SODIUM-DEXTROSE 2-3 GM-% IV SOLR
INTRAVENOUS | Status: AC
  Filled 2011-07-29: qty 50

## 2011-07-29 MED ORDER — BUPIVACAINE-EPINEPHRINE PF 0.25-1:200000 % IJ SOLN
INTRAMUSCULAR | Status: DC | PRN
  Administered 2011-07-29: 60 mL

## 2011-07-29 SURGICAL SUPPLY — 64 items
ADH SKN CLS APL DERMABOND .7 (GAUZE/BANDAGES/DRESSINGS) ×1
BAG SPEC THK2 15X12 ZIP CLS (MISCELLANEOUS) ×1
BAG ZIPLOCK 12X15 (MISCELLANEOUS) ×2 IMPLANT
BANDAGE ELASTIC 6 VELCRO ST LF (GAUZE/BANDAGES/DRESSINGS) ×2 IMPLANT
BANDAGE ESMARK 6X9 LF (GAUZE/BANDAGES/DRESSINGS) ×1 IMPLANT
BLADE SAW SGTL 13.0X1.19X90.0M (BLADE) ×2 IMPLANT
BNDG CMPR 9X6 STRL LF SNTH (GAUZE/BANDAGES/DRESSINGS) ×1
BNDG ESMARK 6X9 LF (GAUZE/BANDAGES/DRESSINGS) ×2
BOWL SMART MIX CTS (DISPOSABLE) ×2 IMPLANT
CEMENT HV SMART SET (Cement) ×2 IMPLANT
CLOTH BEACON ORANGE TIMEOUT ST (SAFETY) ×2 IMPLANT
CUFF TOURN SGL QUICK 34 (TOURNIQUET CUFF) ×2
CUFF TRNQT CYL 34X4X40X1 (TOURNIQUET CUFF) ×1 IMPLANT
DECANTER SPIKE VIAL GLASS SM (MISCELLANEOUS) ×2 IMPLANT
DERMABOND ADVANCED (GAUZE/BANDAGES/DRESSINGS) ×1
DERMABOND ADVANCED .7 DNX12 (GAUZE/BANDAGES/DRESSINGS) ×1 IMPLANT
DRAPE EXTREMITY T 121X128X90 (DRAPE) ×2 IMPLANT
DRAPE POUCH INSTRU U-SHP 10X18 (DRAPES) ×2 IMPLANT
DRAPE U-SHAPE 47X51 STRL (DRAPES) ×2 IMPLANT
DRSG AQUACEL AG ADV 3.5X10 (GAUZE/BANDAGES/DRESSINGS) ×2 IMPLANT
DRSG TEGADERM 4X4.75 (GAUZE/BANDAGES/DRESSINGS) ×2 IMPLANT
DURAPREP 26ML APPLICATOR (WOUND CARE) ×2 IMPLANT
ELECT REM PT RETURN 9FT ADLT (ELECTROSURGICAL) ×2
ELECTRODE REM PT RTRN 9FT ADLT (ELECTROSURGICAL) ×1 IMPLANT
EVACUATOR 1/8 PVC DRAIN (DRAIN) ×2 IMPLANT
FACESHIELD LNG OPTICON STERILE (SAFETY) ×10 IMPLANT
GAUZE SPONGE 2X2 8PLY STRL LF (GAUZE/BANDAGES/DRESSINGS) ×1 IMPLANT
GLOVE BIOGEL PI IND STRL 7.0 (GLOVE) IMPLANT
GLOVE BIOGEL PI IND STRL 7.5 (GLOVE) ×1 IMPLANT
GLOVE BIOGEL PI IND STRL 8 (GLOVE) ×1 IMPLANT
GLOVE BIOGEL PI INDICATOR 7.0 (GLOVE) ×1
GLOVE BIOGEL PI INDICATOR 7.5 (GLOVE) ×1
GLOVE BIOGEL PI INDICATOR 8 (GLOVE) ×1
GLOVE ECLIPSE 8.0 STRL XLNG CF (GLOVE) ×2 IMPLANT
GLOVE ORTHO TXT STRL SZ7.5 (GLOVE) ×4 IMPLANT
GLOVE SURG SS PI 6.5 STRL IVOR (GLOVE) ×1 IMPLANT
GLOVE SURG SS PI 7.5 STRL IVOR (GLOVE) ×2 IMPLANT
GOWN BRE IMP PREV XXLGXLNG (GOWN DISPOSABLE) ×4 IMPLANT
GOWN STRL NON-REIN LRG LVL3 (GOWN DISPOSABLE) ×4 IMPLANT
HANDPIECE INTERPULSE COAX TIP (DISPOSABLE) ×2
IMMOBILIZER KNEE 20 (SOFTGOODS) ×2
IMMOBILIZER KNEE 20 THIGH 36 (SOFTGOODS) IMPLANT
IV NS IRRIG 3000ML ARTHROMATIC (IV SOLUTION) ×1 IMPLANT
KIT BASIN OR (CUSTOM PROCEDURE TRAY) ×2 IMPLANT
MANIFOLD NEPTUNE II (INSTRUMENTS) ×2 IMPLANT
NDL SAFETY ECLIPSE 18X1.5 (NEEDLE) ×1 IMPLANT
NEEDLE HYPO 18GX1.5 SHARP (NEEDLE) ×2
NS IRRIG 1000ML POUR BTL (IV SOLUTION) ×3 IMPLANT
PACK TOTAL JOINT (CUSTOM PROCEDURE TRAY) ×2 IMPLANT
POSITIONER SURGICAL ARM (MISCELLANEOUS) ×2 IMPLANT
SET HNDPC FAN SPRY TIP SCT (DISPOSABLE) ×1 IMPLANT
SET PAD KNEE POSITIONER (MISCELLANEOUS) ×2 IMPLANT
SPONGE GAUZE 2X2 STER 10/PKG (GAUZE/BANDAGES/DRESSINGS) ×1
SUCTION FRAZIER 12FR DISP (SUCTIONS) ×2 IMPLANT
SUT MNCRL AB 4-0 PS2 18 (SUTURE) ×2 IMPLANT
SUT VIC AB 1 CT1 36 (SUTURE) ×7 IMPLANT
SUT VIC AB 2-0 CT1 27 (SUTURE) ×6
SUT VIC AB 2-0 CT1 TAPERPNT 27 (SUTURE) ×3 IMPLANT
SUT VLOC 180 0 24IN GS25 (SUTURE) ×1 IMPLANT
SYR 50ML LL SCALE MARK (SYRINGE) ×2 IMPLANT
TOWEL OR 17X26 10 PK STRL BLUE (TOWEL DISPOSABLE) ×4 IMPLANT
TRAY FOLEY CATH 14FRSI W/METER (CATHETERS) ×2 IMPLANT
WATER STERILE IRR 1500ML POUR (IV SOLUTION) ×2 IMPLANT
WRAP KNEE MAXI GEL POST OP (GAUZE/BANDAGES/DRESSINGS) ×2 IMPLANT

## 2011-07-29 NOTE — Transfer of Care (Signed)
Immediate Anesthesia Transfer of Care Note  Patient: [name]  Procedure(s) Performed: Procedure(s) (LRB): TOTAL KNEE ARTHROPLASTY (Right)  Patient Location: PACU  Anesthesia Type: Spinal  Level of Consciousness: awake, alert  and oriented  Airway & Oxygen Therapy: Patient Spontanous Breathing and Patient connected to face mask oxygen  Post-op Assessment: Report given to PACU RN and Post -op Vital signs reviewed and stable  Post vital signs: Reviewed and stable  Complications: No apparent anesthesia complications

## 2011-07-29 NOTE — Anesthesia Preprocedure Evaluation (Addendum)
Anesthesia Evaluation  Patient identified by MRN, date of birth, ID band Patient awake    Reviewed: Allergy & Precautions, H&P , NPO status , Patient's Chart, lab work & pertinent test results  Airway Mallampati: II TM Distance: >3 FB Neck ROM: Full    Dental No notable dental hx.    Pulmonary asthma , COPD COPD inhaler,  breath sounds clear to auscultation  Pulmonary exam normal       Cardiovascular hypertension, Pt. on medications + Valvular Problems/Murmurs Rhythm:Regular Rate:Normal     Neuro/Psych PSYCHIATRIC DISORDERS Anxiety Depression negative neurological ROS     GI/Hepatic Neg liver ROS, GERD-  Medicated,  Endo/Other  Morbid obesity  Renal/GU negative Renal ROS  negative genitourinary   Musculoskeletal negative musculoskeletal ROS (+)   Abdominal (+) + obese,   Peds negative pediatric ROS (+)  Hematology negative hematology ROS (+)   Anesthesia Other Findings   Reproductive/Obstetrics negative OB ROS                           Anesthesia Physical Anesthesia Plan  ASA: III  Anesthesia Plan: Spinal   Post-op Pain Management:    Induction: Intravenous  Airway Management Planned:   Additional Equipment:   Intra-op Plan:   Post-operative Plan:   Informed Consent: I have reviewed the patients History and Physical, chart, labs and discussed the procedure including the risks, benefits and alternatives for the proposed anesthesia with the patient or authorized representative who has indicated his/her understanding and acceptance.   Dental advisory given  Plan Discussed with: CRNA  Anesthesia Plan Comments: (Discussed risks/benefits of spinal including headache, backache, failure, bleeding, infection, and nerve damage. Patient consents to spinal. Questions answered. Coagulation studies and platelet count acceptable. )       Anesthesia Quick Evaluation

## 2011-07-29 NOTE — Interval H&P Note (Signed)
History and Physical Interval Note:  07/29/2011 2:03 PM  [name]  has presented today for surgery, with the diagnosis of right knee osteoarthritis  The various methods of treatment have been discussed with the patient and family. After consideration of risks, benefits and other options for treatment, the patient has consented to  Procedure(s) (LRB): RIGHT TOTAL KNEE ARTHROPLASTY (Right) as a surgical intervention .  The patient's history has been reviewed, patient examined, no change in status, stable for surgery.  I have reviewed the patients' chart and labs.  Questions were answered to the patient's satisfaction.     Shelda Pal

## 2011-07-29 NOTE — Anesthesia Procedure Notes (Addendum)
Anesthesia Regional Block:   Narrative:    Spinal  Patient location during procedure: OR Start time: 07/29/2011 3:35 PM End time: 07/29/2011 3:50 PM Staffing Anesthesiologist: Azell Der CRNA/Resident: Carmelia Roller R Performed by: resident/CRNA and anesthesiologist  Preanesthetic Checklist Completed: patient identified, site marked, surgical consent, pre-op evaluation, timeout performed, IV checked, risks and benefits discussed and monitors and equipment checked Spinal Block Patient position: sitting Prep: Betadine Patient monitoring: heart rate, cardiac monitor, continuous pulse ox and blood pressure Approach: midline Location: L2-3 Injection technique: single-shot Needle Needle type: Sprotte  Needle gauge: 24 G Needle length: 12.7 cm Assessment Sensory level: T6

## 2011-07-29 NOTE — Op Note (Signed)
NAME:  [name]                      MEDICAL RECORD NO.:  [id number]                             FACILITY:  [facility]      PHYSICIAN:  Madlyn Frankel. Charlann Boxer, M.D.  DATE OF BIRTH:  1960/07/21      DATE OF PROCEDURE:  07/29/2011                                     OPERATIVE REPORT         PREOPERATIVE DIAGNOSIS:  Right knee osteoarthritis.      POSTOPERATIVE DIAGNOSIS:  Right knee osteoarthritis.      FINDINGS:  The patient was noted to have complete loss of cartilage and   bone-on-bone arthritis with associated osteophytes in all three compartments of   the knee with a significant synovitis and associated effusion. Advanced degenerative changes.     PROCEDURE:  Right total knee replacement.      COMPONENTS USED:  DePuy rotating platform posterior stabilized knee   system, a size 3 femur, 2.5 tibia, 15 mm PS insert, and 35 patellar   button.      SURGEON:  Madlyn Frankel. Charlann Boxer, M.D.      ASSISTANT:  Lanney Gins, PA-C.      ANESTHESIA:  Spinal.      SPECIMENS:  None.      COMPLICATION:  None.      DRAINS:  One Hemovac.  EBL: <200cc      TOURNIQUET TIME:   Total Tourniquet Time Documented: Thigh (Right) - 50 minutes .      The patient was stable to the recovery room.      INDICATION FOR PROCEDURE:  [name] is a 51 y.o. female patient of   mine.  The patient had been seen, evaluated, and treated conservatively in the   office with medication, activity modification, and injections.  The patient had   radiographic changes of bone-on-bone arthritis with endplate sclerosis and osteophytes noted.      The patient failed conservative measures including medication, injections, and activity modification, and at this point was ready for more definitive measures.   Based on the radiographic changes and failed conservative measures, the patient   decided to proceed with total knee replacement.  Risks of infection,   DVT, component failure, need for revision surgery, postop  course, and   expectations were all   discussed and reviewed.  Consent was obtained for benefit of pain   relief.      PROCEDURE IN DETAIL:  The patient was brought to the operative theater.   Once adequate anesthesia, preoperative antibiotics, 2 gm of Ancef administered, the patient was positioned supine with the right thigh tourniquet placed.  The  right lower extremity was prepped and draped in sterile fashion.  A time-   out was performed identifying the patient, planned procedure, and   extremity.      The right lower extremity was placed in the [facility] leg holder.  The leg was   exsanguinated, tourniquet elevated to 250 mmHg.  A midline incision was   made followed by median parapatellar arthrotomy.  Following initial   exposure, attention was first directed to the patella.  Precut  measurement was noted to be 22 mm.  I resected down to 13 mm and used a   35 patellar button to restore patellar height as well as cover the cut   surface.      The lug holes were drilled and a metal shim was placed to protect the   patella from retractors and saw blades.      At this point, attention was now directed to the femur.  The femoral   canal was opened with a drill, irrigated to try to prevent fat emboli.  An   intramedullary rod was passed at 3 degrees valgus, 12 mm of bone was   resected off the distal femur due to significant flexion contracture of about 10-15 degrees.  Following this resection, the tibia was   subluxated anteriorly.  Using the extramedullary guide, 10 mm of bone was resected off   the proximal lateral tibia.  We confirmed the gap would be   stable medially and laterally with a 10 mm insert as well as confirmed   the cut was perpendicular in the coronal plane, checking with an alignment rod.      Once this was done, I sized the femur to be a size 3 in the anterior-   posterior dimension, chose a standard component based on medial and   lateral dimension.  The size 3  rotation block was then pinned in   position anterior referenced using the C-clamp to set rotation.  The   anterior, posterior, and  chamfer cuts were made without difficulty nor   notching making certain that I was along the anterior cortex to help   with flexion gap stability.      The final box cut was made off the lateral aspect of distal femur.      At this point, the tibia was sized to be a size 2.5, the size 2.5 tray was   then pinned in position through the medial third of the tubercle,   drilled, and keel punched.  Trial reduction was now carried with a 3 femur,  2.5 tibia, a 12.5 mm insert, and the 35 patella botton.  The knee was brought to   extension, full extension with good flexion stability with the patella   tracking through the trochlea without application of pressure.  Given   all these findings, the trial components removed.  Final components were   opened and cement was mixed.  The knee was irrigated with normal saline   solution and pulse lavage.  The synovial lining was   then injected with 0.25% Marcaine with epinephrine and 1 cc of Toradol,   total of 61 cc.      The knee was irrigated.  Final implants were then cemented onto clean and   dried cut surfaces of bone with the knee brought to extension with a 15   mm trial insert.      Once the cement had fully cured, the excess cement was removed   throughout the knee.  I confirmed I was satisfied with the range of   motion and stability, and the final 15 mm PS insert was chosen.  It was   placed into the knee.      The tourniquet had been let down at 49 minutes.  No significant   hemostasis required.  The medium Hemovac drain was placed deep.  The   extensor mechanism was then reapproximated using #1 Vicryl with the knee   in flexion.  The   remaining wound was closed with 2-0 Vicryl and running 4-0 Monocryl.   The knee was cleaned, dried, dressed sterilely using Dermabond and   Aquacel dressing.  Drain site  dressed separately.  The patient was then   brought to recovery room in stable condition, tolerating the procedure   well.   Please note that Physician Assistant, Lanney Gins, PA-C, was present for the entirety of the case, and was utilized for pre-operative positioning, peri-operative retractor management, general facilitation of the procedure.  He was also utilized for primary wound closure at the end of the case.              Madlyn Frankel Charlann Boxer, M.D.

## 2011-07-29 NOTE — Anesthesia Postprocedure Evaluation (Signed)
  Anesthesia Post-op Note  Patient: [name]  Procedure(s) Performed: Procedure(s) (LRB): TOTAL KNEE ARTHROPLASTY (Right)  Patient Location: PACU  Anesthesia Type: Spinal  Level of Consciousness: awake and alert   Airway and Oxygen Therapy: Patient Spontanous Breathing  Post-op Pain: mild  Post-op Assessment: Post-op Vital signs reviewed, Patient's Cardiovascular Status Stable, Respiratory Function Stable, Patent Airway and No signs of Nausea or vomiting  Post-op Vital Signs: stable  Complications: No apparent anesthesia complications

## 2011-07-29 NOTE — H&P (View-Only) (Signed)
[name], [name] [name]               ACCOUNT NO.:  [id number]  MEDICAL RECORD NO.:  [id number]  LOCATION:  [facility]                         FACILITY:  [facility]  PHYSICIAN:  Madlyn Frankel. Charlann Boxer, M.D.  DATE OF BIRTH:  [birth year]  DATE OF ADMISSION:  07/23/2011 DATE OF DISCHARGE:  07/23/2011                             HISTORY & PHYSICAL   DATE OF SURGERY:  07/29/2011.  ADMITTING DIAGNOSIS:  End-stage osteoarthritis, right knee.  HISTORY OF PRESENT ILLNESS:  This is a 51 year old lady with a history of end-stage osteoarthritis of her right knee that has failed conservative measures.  After discussion of treatment, benefits, risks, and options, the patient is now scheduled for total knee arthroplasty of the right knee.  Note that her medical doctor is Dr. Oliver Barre.  She is a candidate for trans-Cinnamic acid and dexamethasone, and will receive both at surgery.  She plans on going home after surgery.  She is given her home medicines of aspirin, Robaxin, iron, MiraLax, and Colace to take postoperatively.  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  The surgery, risks, benefits, and aftercare were discussed with the patient, questions invited and answered, and surgery to go ahead as scheduled.  PAST MEDICAL HISTORY:  Drug allergy to MORPHINE with itching.  CURRENT MEDICATIONS: 1. Losartan/hydrochlorothiazide 100/25 one daily. 2. Meloxicam 7.5 mg 1 daily which she will stop a week before surgery. 3. Protonix p.r.n. 4. Advair Diskus 1 inhalation daily. 5. Albuterol inhaler p.r.n. 6. Vicodin 5/325, 1 nightly p.r.n. pain.  PREVIOUS SURGERIES:  Include hysterectomy and left knee arthroscopy.  SERIOUS MEDICAL ILLNESSES:  Include hypertension, reflux, and asthma.  FAMILY HISTORY:  Positive for hypertension and CVA.  SOCIAL HISTORY:  The patient is married.  She is a Furniture conservator/restorer. She does not smoke  and drinks on the weekends only.  She again plans on going home after surgery.  REVIEW OF SYSTEMS:  CENTRAL NERVOUS SYSTEM:  Positive for anxiety. Negative for headache, blurred vision, or dizziness.  PULMONARY: Positive for asthma and exertional shortness of breath.  Negative for PND and orthopnea.  Negative for sleep apnea.  CARDIOVASCULAR:  Positive for hypertension and history of a heart murmur.  Negative for chest pain or palpitation.  GI:  Positive for diverticulosis and reflux.  Negative for ulcers or hepatitis.  GU:  Negative for urinary tract difficulties. MUSCULOSKELETAL:  Positive as in HPI.  PHYSICAL EXAMINATION:  VITAL SIGNS:  Blood pressure 158/104, will repeat that today and have her see her medical doctor if still elevated.  Pulse 78 and regular, respirations 14. HEENT:  Head normocephalic.  Nose patent.  Ears patent.  Pupils equal, round, and reactive to light.  Throat without injection. NECK:  Supple without adenopathy.  Carotids 2+ without bruit. CHEST:  Clear to auscultation.  No rales or rhonchi.  Respirations 14. HEART:  Regular rate and rhythm at 78 beats per minute without murmur. ABDOMEN:  Soft with active bowel sounds.  No masses or organomegaly. NEUROLOGIC:  The patient is alert and oriented to time, place, and person.  Cranial nerves  II through XII grossly intact. EXTREMITIES:  Show the right knee with a 5-degree flexion contracture, further flexion to 95 degrees.  Trace effusion noted.  Neurovascular status intact.  IMPRESSION:  End-stage osteoarthritis, right knee.  PLAN:  Total knee arthroplasty, right knee.     [name] [name], P.A.   ______________________________ Madlyn Frankel Charlann Boxer, M.D.    SJC/MEDQ  D:  07/23/2011  T:  07/24/2011  Job:  [id number]

## 2011-07-30 ENCOUNTER — Encounter (HOSPITAL_COMMUNITY): Payer: Self-pay | Admitting: *Deleted

## 2011-07-30 ENCOUNTER — Ambulatory Visit: Payer: PRIVATE HEALTH INSURANCE | Admitting: Critical Care Medicine

## 2011-07-30 LAB — CBC
HCT: 37.5 % (ref 36.0–46.0)
Hemoglobin: 12.2 g/dL (ref 12.0–15.0)
MCH: 29.2 pg (ref 26.0–34.0)
MCHC: 32.5 g/dL (ref 30.0–36.0)
MCV: 89.7 fL (ref 78.0–100.0)
Platelets: 223 10*3/uL (ref 150–400)
RBC: 4.18 MIL/uL (ref 3.87–5.11)
RDW: 13.7 % (ref 11.5–15.5)
WBC: 16.1 10*3/uL — ABNORMAL HIGH (ref 4.0–10.5)

## 2011-07-30 LAB — BASIC METABOLIC PANEL
BUN: 15 mg/dL (ref 6–23)
CO2: 24 mEq/L (ref 19–32)
Calcium: 9.2 mg/dL (ref 8.4–10.5)
Chloride: 104 mEq/L (ref 96–112)
Creatinine, Ser: 0.74 mg/dL (ref 0.50–1.10)
GFR calc Af Amer: >90 mL/min (ref >90)
GFR calc non Af Amer: >90 mL/min (ref >90)
Glucose, Bld: 145 mg/dL — ABNORMAL HIGH (ref 70–99)
Potassium: 3.7 mEq/L (ref 3.5–5.1)
Sodium: 137 mEq/L (ref 135–145)

## 2011-07-30 NOTE — Progress Notes (Signed)
Utilization review completed.  

## 2011-07-30 NOTE — Plan of Care (Signed)
Problem: Consults Goal: Diagnosis- Total Joint Replacement Outcome: Completed/Met Date Met:  07/30/11 Primary Total Hip LEFT ANTERIOR

## 2011-07-30 NOTE — Progress Notes (Signed)
Physical Therapy Treatment Patient Details Name: [name] MRN: [id number] DOB: 1960/05/13 Today's Date: 07/30/2011 Time: 2952-8413 PT Time Calculation (min): 32 min  PT Assessment / Plan / Recommendation Comments on Treatment Session  51 y.o. female POD #1 for R TKA. Pt doing very well with mobility. ABle to do SLR independently, KI DC'ed. Pt eager to progress with exercises and ambulation. OK to ambulate independently in halls. R knee flexion AAROM approx. 55*.     Follow Up Recommendations  Home health PT    Barriers to Discharge        Equipment Recommendations  None recommended by PT    Recommendations for Other Services OT consult  Frequency 7X/week   Plan Discharge plan remains appropriate;Frequency remains appropriate    Precautions / Restrictions     Pertinent Vitals/Pain *7/10 R knee Pt premedicated, ice applied, RLE elevated**    Mobility  Bed Mobility Bed Mobility: Sit to Supine Sit to Supine: 6: Modified independent (Device/Increase time);With rail Transfers Transfers: Sit to Stand;Stand to Sit Sit to Stand: 5: Supervision;From elevated surface;With upper extremity assist;From bed Stand to Sit: With upper extremity assist;With armrests;To chair/3-in-1;5: Supervision Details for Transfer Assistance: VCs to extend R knee prior to sitting Ambulation/Gait Ambulation/Gait Assistance: 5: Supervision Ambulation Distance (Feet): 180 Feet Assistive device: Rolling walker Gait Pattern: Step-to pattern;Decreased stride length Gait velocity: decreased.  Stairs: No Wheelchair Mobility Wheelchair Mobility: No    Exercises Total Joint Exercises Ankle Circles/Pumps: AROM;Both;10 reps;Supine Quad Sets: AROM;Both;10 reps;Supine Short Arc Quad: AAROM;Right;10 reps;Supine Heel Slides: AAROM;Right;10 reps;Supine Straight Leg Raises: AROM;Right;10 reps;Supine Long Arc Quad: AROM;Right;10 reps;Seated Knee Flexion: AROM;Right;10 reps;Seated   PT Diagnosis:    PT  Problem List:   PT Treatment Interventions:     PT Goals Acute Rehab PT Goals PT Goal Formulation: With patient Time For Goal Achievement: 08/02/11 Potential to Achieve Goals: Good Pt will go Supine/Side to Sit: with supervision Pt will go Sit to Supine/Side: with supervision PT Goal: Sit to Supine/Side - Progress: Met Pt will go Sit to Stand: with supervision PT Goal: Sit to Stand - Progress: Met Pt will Ambulate: >150 feet;with modified independence;with least restrictive assistive device PT Goal: Ambulate - Progress: Progressing toward goal Pt will Go Up / Down Stairs: 3-5 stairs;with supervision;with least restrictive assistive device;with rail(s)  Visit Information  Last PT Received On: 07/30/11 Assistance Needed: +1    Subjective Data  Subjective: Can I walk to the bathroom by myself? Can we do more exercises? Patient Stated Goal: to get this knee better   Cognition  Overall Cognitive Status: Appears within functional limits for tasks assessed/performed Arousal/Alertness: Awake/alert Orientation Level: Appears intact for tasks assessed Behavior During Session: [facility] for tasks performed    Balance     End of Session PT - End of Session Equipment Utilized During Treatment: Gait belt Activity Tolerance: Patient tolerated treatment well Patient left: with call bell/phone within reach;in bed Nurse Communication: Mobility status    Tamala Ser 07/30/2011, 1:49 PM [phone]

## 2011-07-30 NOTE — Evaluation (Signed)
Physical Therapy Evaluation Patient Details Name: [name] MRN: [id number] DOB: 17-Apr-1960 Today's Date: 07/30/2011 Time: 5621-3086 PT Time Calculation (min): 24 min  PT Assessment / Plan / Recommendation Clinical Impression  Pt presents s/p R TKA POD 1 with decreased strength, ROM and mobility.  Tolerated ambulation in hallway very well with RW with only min/guard assist and min cues for technique.  Pt will benefit from skilled PT in acute venue to address deficits.  PT recommends HHPT for follow up therapy at D/C to return pt to prior level of funcitoning.     PT Assessment  Patient needs continued PT services    Follow Up Recommendations  Home health PT    Barriers to Discharge None      lEquipment Recommendations  None recommended by PT    Recommendations for Other Services OT consult   Frequency 7X/week    Precautions / Restrictions Precautions Precautions: Knee Required Braces or Orthoses: Knee Immobilizer - Right Knee Immobilizer - Right: Discontinue once straight leg raise with < 10 degree lag Restrictions Weight Bearing Restrictions: No Other Position/Activity Restrictions: WBAT   Pertinent Vitals/Pain 8/10      Mobility  Bed Mobility Bed Mobility: Not assessed Details for Bed Mobility Assistance: Pt was already on EOB when PT arrived.  Transfers Transfers: Sit to Stand;Stand to Sit Sit to Stand: 5: Supervision;From elevated surface;With upper extremity assist;From bed Stand to Sit: With upper extremity assist;4: Min guard;With armrests;To chair/3-in-1 Details for Transfer Assistance: Supervision for standing with nurse tech with cues for hand placement and LE managment.  Min/guard for safety when sitting with cues for controlled descent.  Ambulation/Gait Ambulation/Gait Assistance: 4: Min guard Ambulation Distance (Feet): 160 Feet Assistive device: Rolling walker Ambulation/Gait Assistance Details: Cues for sequencing/technique with RW and to maintain  upright posture with relaxed shoulders.  Gait Pattern: Step-to pattern;Decreased stride length;Trunk flexed Gait velocity: decreased.  Stairs: No Wheelchair Mobility Wheelchair Mobility: No    Exercises     PT Diagnosis: Difficulty walking;Acute pain;Generalized weakness;Abnormality of gait  PT Problem List: Decreased strength;Decreased range of motion;Decreased activity tolerance;Decreased mobility;Decreased knowledge of use of DME PT Treatment Interventions: DME instruction;Gait training;Stair training;Functional mobility training;Therapeutic exercise;Therapeutic activities;Balance training;Patient/family education   PT Goals Acute Rehab PT Goals PT Goal Formulation: With patient Time For Goal Achievement: 08/02/11 Potential to Achieve Goals: Good Pt will go Supine/Side to Sit: with supervision PT Goal: Supine/Side to Sit - Progress: Goal set today Pt will go Sit to Supine/Side: with supervision PT Goal: Sit to Supine/Side - Progress: Goal set today Pt will go Sit to Stand: with supervision PT Goal: Sit to Stand - Progress: Goal set today Pt will Ambulate: >150 feet;with modified independence;with least restrictive assistive device PT Goal: Ambulate - Progress: Goal set today Pt will Go Up / Down Stairs: 3-5 stairs;with supervision;with least restrictive assistive device;with rail(s) PT Goal: Up/Down Stairs - Progress: Goal set today  Visit Information  Last PT Received On: 07/30/11 Assistance Needed: +1    Subjective Data  Subjective: i've had pep talks from others who have had knee replacements.  Patient Stated Goal: to get back home   Prior Functioning  Home Living Lives With: Spouse Available Help at Discharge: Family Type of Home: House Home Access: Stairs to enter Secretary/administrator of Steps: 4 Entrance Stairs-Rails: Right;Can reach both;Left Home Layout: One level Bathroom Shower/Tub: Engineer, manufacturing systems: Standard Home Adaptive Equipment:  Environmental consultant - rolling;Straight cane Prior Function Level of Independence: Independent with assistive device(s) Able to  Take Stairs?: Yes Driving: Yes Vocation: Full time employment Communication Communication: No difficulties    Cognition  Overall Cognitive Status: Appears within functional limits for tasks assessed/performed Arousal/Alertness: Awake/alert Orientation Level: Appears intact for tasks assessed Behavior During Session: [facility] for tasks performed    Extremity/Trunk Assessment Right Lower Extremity Assessment RLE ROM/Strength/Tone: Deficits RLE ROM/Strength/Tone Deficits: able to perform SLR x 5 reps with <10 deg lag, therefore will D/C KI for next visit, ankle motions WFL RLE Sensation: WFL - Light Touch RLE Coordination: WFL - gross motor Left Lower Extremity Assessment LLE ROM/Strength/Tone: WFL for tasks assessed LLE Sensation: WFL - Light Touch LLE Coordination: WFL - gross motor Trunk Assessment Trunk Assessment: Normal   Balance    End of Session PT - End of Session Equipment Utilized During Treatment: Right knee immobilizer Activity Tolerance: Patient tolerated treatment well Patient left: in chair;with call bell/phone within reach;with nursing in room Nurse Communication: Mobility status   Page, Meribeth Mattes 07/30/2011, 9:33 AM

## 2011-07-30 NOTE — Progress Notes (Signed)
Pt refused Decadron IV. States she does not want to be "shaky"

## 2011-07-30 NOTE — Progress Notes (Signed)
  Subjective: 1 Day Post-Op Procedure(s) (LRB): TOTAL KNEE ARTHROPLASTY (Right)   Patient reports pain as mild, pain well controlled. Little pain after PT, but otherwise doing well. No events throughout the night.   Objective:   VITALS:   Filed Vitals:   07/30/11 1016  BP: 112/71  Pulse: 67  Temp: 98 F (36.7 C)  Resp: 16    Neurovascular intact Dorsiflexion/Plantar flexion intact Incision: dressing C/D/I No cellulitis present Compartment soft  LABS  Basename 07/30/11 0413  HGB 12.2  HCT 37.5  WBC 16.1*  PLT 223     Basename 07/30/11 0413  NA 137  K 3.7  BUN 15  CREATININE 0.74  GLUCOSE 145*     Assessment/Plan: 1 Day Post-Op Procedure(s) (LRB): TOTAL KNEE ARTHROPLASTY (Right)   HV drain d/c'ed Foley cath d/c'ed Advance diet Up with therapy D/C IV fluids Discharge home with home health when ready   Anastasio Auerbach. Kaydin Karbowski   PAC  07/30/2011, 10:55 AM

## 2011-07-31 LAB — BASIC METABOLIC PANEL
BUN: 19 mg/dL (ref 6–23)
CO2: 28 mEq/L (ref 19–32)
Calcium: 8.9 mg/dL (ref 8.4–10.5)
Chloride: 102 mEq/L (ref 96–112)
Creatinine, Ser: 0.78 mg/dL (ref 0.50–1.10)
GFR calc Af Amer: >90 mL/min (ref >90)
GFR calc non Af Amer: >90 mL/min (ref >90)
Glucose, Bld: 90 mg/dL (ref 70–99)
Potassium: 3.5 mEq/L (ref 3.5–5.1)
Sodium: 136 mEq/L (ref 135–145)

## 2011-07-31 LAB — CBC
HCT: 36.1 % (ref 36.0–46.0)
Hemoglobin: 11.4 g/dL — ABNORMAL LOW (ref 12.0–15.0)
MCH: 28.6 pg (ref 26.0–34.0)
MCHC: 31.6 g/dL (ref 30.0–36.0)
MCV: 90.5 fL (ref 78.0–100.0)
Platelets: 220 10*3/uL (ref 150–400)
RBC: 3.99 MIL/uL (ref 3.87–5.11)
RDW: 14 % (ref 11.5–15.5)
WBC: 12.1 10*3/uL — ABNORMAL HIGH (ref 4.0–10.5)

## 2011-07-31 MED ORDER — POLYETHYLENE GLYCOL 3350 17 G PO PACK: 17.0000 g | PACK | Freq: Two times a day (BID) | ORAL | Status: AC

## 2011-07-31 MED ORDER — DSS 100 MG PO CAPS: 100.0000 mg | ORAL_CAPSULE | Freq: Two times a day (BID) | ORAL | Status: AC

## 2011-07-31 MED ORDER — ASPIRIN EC 325 MG PO TBEC: 325.0000 mg | DELAYED_RELEASE_TABLET | Freq: Two times a day (BID) | ORAL | Status: AC

## 2011-07-31 MED ORDER — DIPHENHYDRAMINE HCL 25 MG PO CAPS: 25.0000 mg | ORAL_CAPSULE | Freq: Four times a day (QID) | ORAL | Status: DC | PRN

## 2011-07-31 MED ORDER — FERROUS SULFATE 325 (65 FE) MG PO TABS: 325.0000 mg | ORAL_TABLET | Freq: Three times a day (TID) | ORAL | Status: DC

## 2011-07-31 MED ORDER — HYDROCODONE-ACETAMINOPHEN 7.5-325 MG PO TABS: 1.0000 | ORAL_TABLET | ORAL | Status: AC | PRN

## 2011-07-31 MED ORDER — METHOCARBAMOL 500 MG PO TABS: 500.0000 mg | ORAL_TABLET | Freq: Four times a day (QID) | ORAL | Status: AC | PRN

## 2011-07-31 NOTE — Progress Notes (Signed)
Physical Therapy Treatment Patient Details Name: [name] MRN: [id number] DOB: Aug 01, 1960 Today's Date: 07/31/2011 Time: 4098-1191 PT Time Calculation (min): 35 min  PT Assessment / Plan / Recommendation Comments on Treatment Session  pt progressing well and plans to D/C to home.    Follow Up Recommendations  Home health PT    Barriers to Discharge        Equipment Recommendations  Other (comment) (Pt using mother in law RW, plans to bring it in for [place] to check)    Recommendations for Other Services    Frequency 7X/week   Plan Discharge plan remains appropriate    Precautions / Restrictions Precautions Precautions: Knee Required Braces or Orthoses: Knee Immobilizer - Right Knee Immobilizer - Right: Discontinue once straight leg raise with < 10 degree lag Restrictions Weight Bearing Restrictions: No Other Position/Activity Restrictions: WBAT    Pertinent Vitals/Pain C/o 7/10 during TE's ICE applied repositioned    Mobility  Bed Mobility Bed Mobility: Supine to Sit Supine to Sit: 5: Supervision Details for Bed Mobility Assistance: only required increased time and min guard assist to support R LE up onto bed Transfers Transfers: Sit to Stand;Stand to Sit Sit to Stand: 5: Supervision;From chair/3-in-1 Stand to Sit: 5: Supervision Details for Transfer Assistance: good use of hands and safety cognition Ambulation/Gait Ambulation/Gait Assistance: 5: Supervision Ambulation Distance (Feet): 125 Feet Assistive device: Rolling walker Ambulation/Gait Assistance Details: one VC for safety with turns and backward gait sequencing Gait Pattern: Step-through pattern;Decreased stride length;Decreased stance time - right Gait velocity: decreased Stairs: No Wheelchair Mobility Wheelchair Mobility: No    Exercises Total Joint Exercises Ankle Circles/Pumps: AROM;Both;10 reps;Supine Quad Sets: AROM;Both;10 reps;Supine Gluteal Sets: AROM;Both;10 reps;Supine Towel Squeeze:  AROM;Both;10 reps;Supine Short Arc Quad: AAROM;Right;10 reps;Supine Heel Slides: AAROM;Right;10 reps;Supine Hip ABduction/ADduction: AAROM;Right;10 reps;Supine Straight Leg Raises: AAROM;Right;10 reps;Supine       PT Goals      progressing    Visit Information  Last PT Received On: 07/31/11 Assistance Needed: +1                   End of Session PT - End of Session Equipment Utilized During Treatment: Gait belt;Right knee immobilizer Activity Tolerance: Patient tolerated treatment well Patient left: in bed;with call bell/phone within reach;Other (comment) (ICE R knee)    Felecia Shelling  PTA [facility]  Acute  Rehab Pager     [phone]

## 2011-07-31 NOTE — Progress Notes (Signed)
Physical Therapy Treatment Patient Details Name: [name] MRN: [id number] DOB: [birth year] Today's Date: 07/31/2011 Time: 1191-4782 PT Time Calculation (min): 25 min  PT Assessment / Plan / Recommendation Comments on Treatment Session  pt progressing well and plans to D/C to home.    Follow Up Recommendations  Home health PT    Barriers to Discharge        Equipment Recommendations  Other (comment) (Pt using mother in law RW, plans to bring it in for [place] to check out for proper height)    Recommendations for Other Services    Frequency 7X/week   Plan Discharge plan remains appropriate    Precautions / Restrictions Precautions Precautions: Knee Required Braces or Orthoses: Knee Immobilizer - Right Knee Immobilizer - Right: Discontinue once straight leg raise with < 10 degree lag Restrictions Weight Bearing Restrictions: No Other Position/Activity Restrictions: WBAT    Pertinent Vitals/Pain C/o 5/10 with act Applied ICE    Mobility  Bed Mobility Bed Mobility: Supine to Sit Supine to Sit: 5: Supervision Details for Bed Mobility Assistance: only required increased time and min guard assist to support R LE up onto bed Transfers Transfers: Sit to Stand;Stand to Sit Sit to Stand: 5: Supervision;From chair/3-in-1 Stand to Sit: 5: Supervision Details for Transfer Assistance: good use of hands and safety cognition Ambulation/Gait Ambulation/Gait Assistance: 5: Supervision Ambulation Distance (Feet): 225 Feet Assistive device: Rolling walker Ambulation/Gait Assistance Details: one VC for safety with turns and backward gait sequencing Gait Pattern: Step-through pattern;Decreased stride length;Decreased stance time - right Gait velocity: decreased Stairs: yes Up/down forward using one rail and one crutch @ min guard assist and 50% VC's on proper sequencing and safety         PT Goals   progressing    Visit Information  Last PT Received On: 07/31/11 Assistance  Needed: +1                   End of Session PT - End of Session Equipment Utilized During Treatment: Gait belt;Right knee immobilizer Activity Tolerance: Patient tolerated treatment well Patient left: in bed;with call bell/phone within reach;Other (comment) (ICE R knee)   Felecia Shelling  PTA [facility]  Acute  Rehab Pager     [phone]

## 2011-07-31 NOTE — Progress Notes (Signed)
Patient ID: [name], female   DOB: 1960/03/18, 51 y.o.   MRN: [id number] Subjective: 2 Days Post-Op Procedure(s) (LRB): TOTAL KNEE ARTHROPLASTY (Right)    Patient reports pain as moderate.  Increased pain with activity  Objective:   VITALS:   Filed Vitals:   07/30/11 2301  BP: 101/63  Pulse: 74  Temp: 98.4 F (36.9 C)  Resp: 16    Neurovascular intact Incision: dressing C/D/I  LABS  Basename 07/31/11 0442 07/30/11 0413  HGB 11.4* 12.2  HCT 36.1 37.5  WBC 12.1* 16.1*  PLT 220 223     Basename 07/31/11 0442 07/30/11 0413  NA 136 137  K 3.5 3.7  BUN 19 15  CREATININE 0.78 0.74  GLUCOSE 90 145*    No results found for this basename: LABPT:2,INR:2 in the last 72 hours   Assessment/Plan: 2 Days Post-Op Procedure(s) (LRB): TOTAL KNEE ARTHROPLASTY (Right)   Up with therapy Plan for discharge tomorrow Discharge home with home health tomorrow

## 2011-07-31 NOTE — Discharge Summary (Signed)
Physician Discharge Summary  Patient ID: [name] MRN: [id number] DOB/AGE: [birth year] 50 y.o.  Admit date: 07/29/2011 Discharge date:  08/01/2011  Procedures:  Procedure(s) (LRB): TOTAL KNEE ARTHROPLASTY (Right)  Attending Physician:  Dr. Durene Romans   Admission Diagnoses: End-stage osteoarthritis, right knee  Discharge Diagnoses:  Principal Problem:  *S/P right TKA Hypertension Reflux Asthma   HPI: This is a 51 year old lady with a history of end-stage osteoarthritis of her right knee that has failed conservative measures. After discussion of treatment, benefits, risks, and options, the patient is now scheduled for total knee arthroplasty of the right knee. Note that her medical doctor is Dr. Oliver Barre. She is a candidate for tranexamic acid and dexamethasone, and will receive both at surgery. She plans on going home after surgery. She is given her home medicines of aspirin, Robaxin, iron, MiraLax, and Colace to take postoperatively. 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 The surgery, risks, benefits, and aftercare were discussed with the patient, questions invited and answered, and surgery to go ahead as scheduled.  PCP: Oliver Barre, MD   Discharged Condition: good  Hospital Course:  Patient underwent the above stated procedure on 07/29/2011. Patient tolerated the procedure well and brought to the recovery room in good condition and subsequently to the floor.  POD #1 BP: 112/71 ; Pulse: 67 ; Temp: 98 F (36.7 C) ; Resp: 16 Pt's foley was removed, as well as the hemovac drain removed. IV was changed to a saline lock. Patient reports pain as mild, pain well controlled. Little pain after PT, but otherwise doing well. No events throughout the night. Neurovascular intact, dorsiflexion/plantar flexion intact, incision: dressing C/D/I, no cellulitis present and compartment soft.    LABS  Basename  07/30/11 0413  HGB  12.2  HCT  37.5   POD #2  BP: 101/63 ; Pulse: 74 ; Temp: 98.4 F (36.9 C) ; Resp: 16  Patient reports pain as moderate. Increased pain with activity. Neurovascular intact, dorsiflexion/plantar flexion intact, incision: dressing C/D/I, no cellulitis present and compartment soft.   LABS  Basename  07/31/11 0442   HGB  11.4  HCT  36.1   POD #3  BP: 115/74 ; Pulse: 69 ; Temp: 98 F (36.7 C) ; Resp: 14  Patient reports pain as mild, well controlled with medication. Positive for flatus, but no BM. She knows what will get her going and will be doing this at home. Denies wanting a enema in the hospital. Instructed on bowel management. She says she is ready to be discharged home. Neurovascular intact, dorsiflexion/plantar flexion intact, incision: dressing C/D/I, no cellulitis present and compartment soft.   LABS   No new labs   Discharge Exam: General appearance: alert, cooperative and no distress Extremities: Homans sign is negative, no sign of DVT, no edema, redness or tenderness in the calves or thighs and no ulcers, gangrene or trophic changes  Disposition: Home or Self Care with follow up in 2 weeks   Follow-up Information    Follow up with Shelda Pal, MD. Schedule an appointment as soon as possible for a visit in 2 weeks.   Contact information:   [facility] [address], [address] [place] [address] [phone]          Discharge Orders    Future Orders Please Complete By Expires   Diet - low sodium  heart healthy      Call MD / Call 911      Comments:   If you experience chest pain or shortness of breath, CALL 911 and be transported to the hospital emergency room.  If you develope a fever above 101 F, pus (white drainage) or increased drainage or redness at the wound, or calf pain, call your surgeon's office.   Discharge instructions      Comments:   Maintain surgical dressing for 8 days,  then replace with gauze and tape. Keep the area dry and clean until follow up. Follow up in 2 weeks at [facility]. Call with any questions or concerns.   Constipation Prevention      Comments:   Drink plenty of fluids.  Prune juice may be helpful.  You may use a stool softener, such as Colace (over the counter) 100 mg twice a day.  Use MiraLax (over the counter) for constipation as needed.   Increase activity slowly as tolerated      Driving restrictions      Comments:   No driving for 4 weeks   TED hose      Comments:   Use stockings (TED hose) for 2 weeks on both leg(s).  You may remove them at night for sleeping.   Change dressing      Comments:   Maintain surgical dressing for 8 days, then change the dressing daily with sterile 4 x 4 inch gauze dressing and tape. Keep the area dry and clean.      Current Discharge Medication List    START taking these medications   Details  aspirin EC 325 MG tablet Take 1 tablet (325 mg total) by mouth 2 (two) times daily. X 4 weeks Qty: 60 tablet, Refills: 0    diphenhydrAMINE (BENADRYL) 25 mg capsule Take 1 capsule (25 mg total) by mouth every 6 (six) hours as needed for itching, allergies or sleep. Qty: 30 capsule    docusate sodium 100 MG CAPS Take 100 mg by mouth 2 (two) times daily. Qty: 10 capsule    ferrous sulfate 325 (65 FE) MG tablet Take 1 tablet (325 mg total) by mouth 3 (three) times daily after meals.    HYDROcodone-acetaminophen (NORCO) 7.5-325 MG per tablet Take 1-2 tablets by mouth every 4 (four) hours as needed for pain. Qty: 120 tablet, Refills: 0    methocarbamol (ROBAXIN) 500 MG tablet Take 1 tablet (500 mg total) by mouth every 6 (six) hours as needed (muscle spasms).    polyethylene glycol (MIRALAX / GLYCOLAX) packet Take 17 g by mouth 2 (two) times daily. Qty: 14 each      CONTINUE these medications which have NOT CHANGED   Details  ADVAIR DISKUS 250-50 MCG/DOSE AEPB USE 1 PUFF 2 TIMES A DAY Qty: 1  each, Refills: 6    albuterol (PROAIR HFA) 108 (90 BASE) MCG/ACT inhaler Inhale 2 puffs into the lungs every 4 (four) hours as needed. Qty: 1 Inhaler, Refills: 0    losartan-hydrochlorothiazide (HYZAAR) 100-25 MG per tablet Take 1 tablet by mouth daily. Qty: 90 tablet, Refills: 3      STOP taking these medications     HYDROcodone-acetaminophen (NORCO) 5-325 MG per tablet Comments:  Reason for Stopping:       ibuprofen (ADVIL,MOTRIN) 800 MG tablet Comments:  Reason for Stopping:       meloxicam (MOBIC) 15 MG tablet Comments:  Reason for Stopping:  Signed:  Anastasio Auerbach. Emmakate Hypes   PAC  08/01/2011, 8:19 AM

## 2011-07-31 NOTE — Progress Notes (Signed)
Occupational Therapy Note Chart reviewed. Spoke with patient who states that she has been getting up to the bathroom without difficulty and also doing her sponge bath. She doesn't feel she needs OT. She plans to borrow a 3in1 and tubseat and will confirm these are available today and let nursing know if she needs any DME ordered. Judithann Sauger OTR/L [phone] 07/31/2011

## 2011-08-01 ENCOUNTER — Encounter (HOSPITAL_COMMUNITY): Payer: Self-pay | Admitting: Orthopedic Surgery

## 2011-08-01 NOTE — Care Management Note (Unsigned)
    Page 1 of 2   08/01/2011     9:04:26 AM   CARE MANAGEMENT NOTE 08/01/2011  Patient:  [name],[name]   Account Number:  [id number]  Date Initiated:  07/31/2011  Documentation initiated by:  Colleen Can  Subjective/Objective Assessment:   dx OA left knee; total knee replacemnt     Action/Plan:   Pt plans to go back to home in [place] where son and brother will be caregivers. She already has RW, crutches and bath stool. Wants [facility] for [facility] services. HH list placed on chart   Anticipated DC Date:  08/01/2011   Anticipated DC Plan:  HOME W HOME HEALTH SERVICES  In-house referral  NA      DC Planning Services  CM consult      [facility] Choice  HOME HEALTH   Choice offered to / List presented to:  C-1 Patient   DME arranged  NA      DME agency  NA     HH arranged  HH-2 PT      [facility] agency  [facility]   Status of service:  Completed, signed off Medicare Important Message given?  NO (If response is "NO", the following Medicare IM given date fields will be blank) Date Medicare IM given:   Date Additional Medicare IM given:    Discharge Disposition:    Per UR Regulation:    If discussed at Long Length of Stay Meetings, dates discussed:    Comments:  07/31/2011 [name] bsn ccm [phone] Genevieve Norlander will start [facility] services day after discharge. Anticipate d/c 08/01/2011

## 2011-08-01 NOTE — Progress Notes (Signed)
Pt for d/c home today. No IV sites noted. Dressing CDI to R knee. No changes in am assessments today. Pain med well tolerated & aids with pain relief as claimed. D/C instructions & RX given to pt with verbalized understanding. Family in to assist pt with d/c.

## 2011-08-01 NOTE — Progress Notes (Signed)
  Subjective: 3 Days Post-Op Procedure(s) (LRB): TOTAL KNEE ARTHROPLASTY (Right)   Patient reports pain as mild, well controlled with medication. Positive for flatus, but no BM. She knows what will get her going and will be doing this at home. Denies wanting a enema in the hospital.  Instructed on bowel management.  She says she is ready to be discharged home.  Objective:   VITALS:   Filed Vitals:   08/01/11 0614  BP: 115/74  Pulse: 69  Temp: 98 F (36.7 C)  Resp: 14    Neurovascular intact Dorsiflexion/Plantar flexion intact Incision: dressing C/D/I No cellulitis present Compartment soft  LABS  Basename 07/31/11 0442 07/30/11 0413  HGB 11.4* 12.2  HCT 36.1 37.5  WBC 12.1* 16.1*  PLT 220 223     Basename 07/31/11 0442 07/30/11 0413  NA 136 137  K 3.5 3.7  BUN 19 15  CREATININE 0.78 0.74  GLUCOSE 90 145*     Assessment/Plan: 3 Days Post-Op Procedure(s) (LRB): TOTAL KNEE ARTHROPLASTY (Right)   Up with therapy Discharge home with home health Follow up in 2 weeks at [facility].  Follow-up Information    Follow up with OLIN,Rosezella Kronick D in 2 weeks.   Contact information:   [facility] [address], [address] [place] [address] [phone]          Anastasio Auerbach. Farran Amsden   PAC  08/01/2011, 7:52 AM

## 2011-08-01 NOTE — Progress Notes (Signed)
Physical Therapy Treatment Patient Details Name: [name] MRN: [id number] DOB: 09-04-1960 Today's Date: 08/01/2011 Time: 0840-0900 PT Time Calculation (min): 20 min  PT Assessment / Plan / Recommendation Comments on Treatment Session  D/C home today. Pt progressing well with mobility, ROM.     Follow Up Recommendations  Home health PT    Barriers to Discharge        Equipment Recommendations       Recommendations for Other Services    Frequency 7X/week   Plan Discharge plan remains appropriate    Precautions / Restrictions Precautions Precautions: Knee Required Braces or Orthoses: Knee Immobilizer - Right (DC'd KI 08/01/11-pt able to SLR) Knee Immobilizer - Right: Discontinue once straight leg raise with < 10 degree lag Restrictions Weight Bearing Restrictions: No RLE Weight Bearing: Weight bearing as tolerated   Pertinent Vitals/Pain     Mobility  Bed Mobility Bed Mobility: Sit to Supine Sit to Supine: 6: Modified independent (Device/Increase time) Transfers Transfers: Sit to Stand;Stand to Sit Sit to Stand: 6: Modified independent (Device/Increase time);From toilet;From chair/3-in-1;With upper extremity assist;With armrests Stand to Sit: 6: Modified independent (Device/Increase time);To bed;To toilet;With upper extremity assist;With armrests Ambulation/Gait Ambulation/Gait Assistance: 5: Supervision Ambulation Distance (Feet): 150 Feet Assistive device: Rolling walker Ambulation/Gait Assistance Details: Slow gait speed. 3-4 vey short rest breaks to rest arms.  Gait Pattern: Step-to pattern;Antalgic Stairs:  (Pt reported she felt comfortable with steps-practiced yesterday-declined to practice again)    Exercises Total Joint Exercises Ankle Circles/Pumps: AROM;Both;10 reps;Supine Quad Sets: AROM;Strengthening;Right;10 reps;Supine Short Arc Quad: AAROM;Strengthening;Right;10 reps;Supine Heel Slides: AAROM;Strengthening;Right;10 reps;Supine Hip  ABduction/ADduction: AAROM;Strengthening;Right;10 reps;Supine Straight Leg Raises: AROM;Strengthening;Right;10 reps;Supine   PT Diagnosis:    PT Problem List:   PT Treatment Interventions:     PT Goals Acute Rehab PT Goals PT Goal: Sit to Supine/Side - Progress: Met PT Goal: Sit to Stand - Progress: Met PT Goal: Ambulate - Progress: Progressing toward goal  Visit Information  Last PT Received On: 08/01/11 Assistance Needed: +1    Subjective Data  Subjective: "I've been asking...have you seen the people in purple?" Patient Stated Goal: To get better   Cognition  Overall Cognitive Status: Appears within functional limits for tasks assessed/performed Arousal/Alertness: Awake/alert Orientation Level: Appears intact for tasks assessed Behavior During Session: [facility] for tasks performed    Balance     End of Session PT - End of Session Equipment Utilized During Treatment: Gait belt Activity Tolerance: Patient tolerated treatment well Patient left: in bed;with call bell/phone within reach;with family/visitor present    [name] [facility] 08/01/2011, 9:55 AM [phone]

## 2011-08-25 ENCOUNTER — Other Ambulatory Visit: Payer: Self-pay | Admitting: Internal Medicine

## 2011-09-16 ENCOUNTER — Telehealth: Payer: Self-pay | Admitting: Internal Medicine

## 2011-09-16 ENCOUNTER — Other Ambulatory Visit (INDEPENDENT_AMBULATORY_CARE_PROVIDER_SITE_OTHER): Payer: PRIVATE HEALTH INSURANCE

## 2011-09-16 ENCOUNTER — Encounter: Payer: Self-pay | Admitting: Internal Medicine

## 2011-09-16 ENCOUNTER — Ambulatory Visit (INDEPENDENT_AMBULATORY_CARE_PROVIDER_SITE_OTHER): Payer: PRIVATE HEALTH INSURANCE | Admitting: Endocrinology

## 2011-09-16 ENCOUNTER — Encounter: Payer: Self-pay | Admitting: Endocrinology

## 2011-09-16 VITALS — BP 122/82 | HR 80 | Temp 97.5°F

## 2011-09-16 DIAGNOSIS — Z0000 Encounter for general adult medical examination without abnormal findings: Secondary | ICD-10-CM

## 2011-09-16 DIAGNOSIS — R300 Dysuria: Secondary | ICD-10-CM

## 2011-09-16 LAB — BASIC METABOLIC PANEL
BUN: 18 mg/dL (ref 6–23)
CO2: 29 mEq/L (ref 19–32)
Calcium: 9.4 mg/dL (ref 8.4–10.5)
Chloride: 102 mEq/L (ref 96–112)
Creatinine, Ser: 0.8 mg/dL (ref 0.4–1.2)
GFR: 105 mL/min (ref >60.00)
Glucose, Bld: 96 mg/dL (ref 70–99)
Potassium: 3.4 mEq/L — ABNORMAL LOW (ref 3.5–5.1)
Sodium: 139 mEq/L (ref 135–145)

## 2011-09-16 LAB — CBC WITH DIFFERENTIAL/PLATELET
Basophils Absolute: 0 10*3/uL (ref 0.0–0.1)
Basophils Relative: 0.1 % (ref 0.0–3.0)
Eosinophils Absolute: 0.4 10*3/uL (ref 0.0–0.7)
Eosinophils Relative: 3.8 % (ref 0.0–5.0)
HCT: 39.2 % (ref 36.0–46.0)
Hemoglobin: 12.8 g/dL (ref 12.0–15.0)
Lymphocytes Relative: 22.2 % (ref 12.0–46.0)
Lymphs Abs: 2.3 10*3/uL (ref 0.7–4.0)
MCHC: 32.7 g/dL (ref 30.0–36.0)
MCV: 89.9 fl (ref 78.0–100.0)
Monocytes Absolute: 0.7 10*3/uL (ref 0.1–1.0)
Monocytes Relative: 6.8 % (ref 3.0–12.0)
Neutro Abs: 6.9 10*3/uL (ref 1.4–7.7)
Neutrophils Relative %: 67.1 % (ref 43.0–77.0)
Platelets: 253 10*3/uL (ref 150.0–400.0)
RBC: 4.36 Mil/uL (ref 3.87–5.11)
RDW: 14.9 % — ABNORMAL HIGH (ref 11.5–14.6)
WBC: 10.2 10*3/uL (ref 4.5–10.5)

## 2011-09-16 LAB — URINALYSIS, ROUTINE W REFLEX MICROSCOPIC
Bilirubin Urine: NEGATIVE
Hgb urine dipstick: NEGATIVE
Ketones, ur: NEGATIVE
Leukocytes, UA: NEGATIVE
Nitrite: NEGATIVE
Specific Gravity, Urine: 1.025 (ref 1.000–1.030)
Total Protein, Urine: NEGATIVE
Urine Glucose: NEGATIVE
Urobilinogen, UA: 0.2 (ref 0.0–1.0)
pH: 6 (ref 5.0–8.0)

## 2011-09-16 LAB — HEPATIC FUNCTION PANEL
ALT: 21 U/L (ref 0–35)
AST: 23 U/L (ref 0–37)
Albumin: 3.4 g/dL — ABNORMAL LOW (ref 3.5–5.2)
Alkaline Phosphatase: 93 U/L (ref 39–117)
Bilirubin, Direct: 0.1 mg/dL (ref 0.0–0.3)
Total Bilirubin: 0.7 mg/dL (ref 0.3–1.2)
Total Protein: 7.1 g/dL (ref 6.0–8.3)

## 2011-09-16 LAB — LIPID PANEL
Cholesterol: 229 mg/dL — ABNORMAL HIGH (ref 0–200)
HDL: 102 mg/dL (ref >39.00)
Total CHOL/HDL Ratio: 2
Triglycerides: 62 mg/dL (ref 0.0–149.0)
VLDL: 12.4 mg/dL (ref 0.0–40.0)

## 2011-09-16 LAB — POCT URINALYSIS DIPSTICK
Bilirubin, UA: NEGATIVE
Blood, UA: NEGATIVE
Glucose, UA: NEGATIVE
Ketones, UA: NEGATIVE
Leukocytes, UA: NEGATIVE
Nitrite, UA: NEGATIVE
Protein, UA: NEGATIVE
Spec Grav, UA: 1.025
Urobilinogen, UA: 0.2
pH, UA: 6

## 2011-09-16 LAB — LDL CHOLESTEROL, DIRECT: Direct LDL: 114.7 mg/dL

## 2011-09-16 LAB — TSH: TSH: 1.4 u[IU]/mL (ref 0.35–5.50)

## 2011-09-16 MED ORDER — METRONIDAZOLE 500 MG PO TABS: 500.0000 mg | ORAL_TABLET | Freq: Three times a day (TID) | ORAL | Status: AC

## 2011-09-16 MED ORDER — FLUCONAZOLE 150 MG PO TABS: 150.0000 mg | ORAL_TABLET | Freq: Every day | ORAL | Status: AC

## 2011-09-16 NOTE — Telephone Encounter (Signed)
Caller: [name]/Patient; PCP: Oliver Barre; CB#: [phone];  Call regarding Urinary Pain/Bleeding;  Onset: 09/13/11.  Afebrile.  Reports dysuria with brown color when wipes.  Advised to see MD within 24 hrs for one or more urinary tract symptoms and not been previously evaluated per Blood Urine.  Appointment scheduled for 1030 09/16/11 with Dr Everardo All.

## 2011-09-16 NOTE — Progress Notes (Signed)
  Subjective:    Patient ID: [name], female    DOB: 1960/08/07, 51 y.o.   MRN: [id number]  HPI Pt states few days of slight vag d/c, and assoc dysuria.  She saw urol last year for possible uti's, but w/u was neg.   Past Medical History  Diagnosis Date  . COPD (chronic obstructive pulmonary disease)   . Hypertension   . Disorder of vocal cord   . Asthma   . Obesity (BMI 30-39.9)   . GERD (gastroesophageal reflux disease)   . Sinusitis   . Alcohol abuse   . DJD (degenerative joint disease)     right knee, mot to severe  . Anxiety   . Panic   . History of colon polyps   . History of colonic diverticulitis   . ANXIETY 04/05/2010  . ASTHMA 04/24/2007  . COLONIC POLYPS, HX OF 04/05/2010  . DIVERTICULITIS, HX OF 04/05/2010  . GERD 04/24/2007  . HYPERTENSION 04/24/2007  . OBESITY 04/24/2007  . PALPITATIONS, HX OF 09/14/2007  . VOCAL CORD DISORDER 04/24/2007  . Depression 02/10/2011  . Heart murmur     hx of     Past Surgical History  Procedure Date  . Vesicovaginal fistula closure w/ tah   . Abdominal hysterectomy   . Knee arthroscopy     left   . Total knee arthroplasty 07/29/2011    Procedure: TOTAL KNEE ARTHROPLASTY;  Surgeon: Shelda Pal, MD;  Location: [facility];  Service: Orthopedics;  Laterality: Right;    History   Social History  . Marital Status: Married    Spouse Name: N/A    Number of Children: 1  . Years of Education: N/A   Occupational History  . PACKER    Social History Main Topics  . Smoking status: Former Smoker -- 0.5 packs/day for 7 years    Types: Cigarettes    Quit date: 02/11/1988  . Smokeless tobacco: Never Used  . Alcohol Use: Yes     occasionally  . Drug Use: No     smoked marajuania x 10 years.  Quit in 1985.  Marland Kitchen Sexually Active: Not on file   Other Topics Concern  . Not on file   Social History Narrative   Administrator, Civil Service on file.  [facility] [place] April 05, 2010 12:04 PM    Current Outpatient Prescriptions on File  Prior to Visit  Medication Sig Dispense Refill  . ADVAIR DISKUS 250-50 MCG/DOSE AEPB USE 1 PUFF 2 TIMES A DAY  1 each  6  . albuterol (PROAIR HFA) 108 (90 BASE) MCG/ACT inhaler Inhale 2 puffs into the lungs every 4 (four) hours as needed.  1 Inhaler  0  . losartan-hydrochlorothiazide (HYZAAR) 100-25 MG per tablet TAKE 1 TABLET BY MOUTH DAILY.  90 tablet  2    Allergies  Allergen Reactions  . Morphine Itching  . Shrimp (Shellfish Allergy) Itching  . Latex Rash    Family History  Problem Relation Age of Onset  . Lymphoma Sister   . Stroke Mother     BP 122/82  Pulse 80  Temp 97.5 F (36.4 C) (Oral)  SpO2 97%    Review of Systems Denies fever.      Objective:   Physical Exam VITAL SIGNS:  See vs page GENERAL: no distress Abd: no suprapubic tenderness.       Assessment & Plan:  sxs of uncertain etiology, new.  Possible vaginitis

## 2011-09-16 NOTE — Patient Instructions (Addendum)
i have sent a prescription to your pharmacy, for 2 antibiotics. I hope you feel better soon.  If you don't feel better by next week, please call back.

## 2011-10-01 ENCOUNTER — Other Ambulatory Visit: Payer: Self-pay | Admitting: Internal Medicine

## 2011-12-03 ENCOUNTER — Other Ambulatory Visit: Payer: Self-pay | Admitting: Critical Care Medicine

## 2011-12-05 ENCOUNTER — Other Ambulatory Visit: Payer: Self-pay | Admitting: Critical Care Medicine

## 2011-12-12 ENCOUNTER — Other Ambulatory Visit: Payer: Self-pay | Admitting: Internal Medicine

## 2011-12-12 MED ORDER — BENZONATATE 100 MG PO CAPS: ORAL_CAPSULE | ORAL | Status: DC

## 2011-12-12 NOTE — Telephone Encounter (Signed)
Patient informed. 

## 2011-12-12 NOTE — Telephone Encounter (Signed)
Caller: [name]/Patient; Patient Name: [name]; PCP: Oliver Barre (Adults only); Best Callback Phone Number: [phone].CALLING REQUESTING COUGH MEDICATION.  Onset with cold s/sx symptoms three days ago 01/09/12. Low grade  fever 100.0 (o), +chest congestion yellow and runny nose yellow. +coughing at night dry/hack but productive yellow/green. She is able to eat and drink. She states this has not triggered his Asthma. She has her inhaler if she should need the medication. Emergent s/sx ruled out per Cough-Adult Protocol with the exception to "productive cough with colored sputum". See provider in 24 hours. Patient states she cannot make appointment, she has to work due to her job.  Unable to make Saturday schedule. She states she is calling for Tussionex cough medication that was prescibed to her in March 2013.  Home care instructions and call back parameters reviewed. Understanding expressed.  PLEASE CONTACT PATIENT CONCERNING REQUEST FOR TUSSIONEX

## 2011-12-12 NOTE — Telephone Encounter (Signed)
I can really do the tussionex as it is a controlled substance without exam  OK for tessalon perle, consider OV

## 2012-01-12 ENCOUNTER — Telehealth: Payer: Self-pay | Admitting: Critical Care Medicine

## 2012-01-12 MED ORDER — ADVAIR DISKUS 250-50 MCG/DOSE IN AEPB: 1.0000 | INHALATION_SPRAY | Freq: Two times a day (BID) | RESPIRATORY_TRACT | Status: DC

## 2012-01-12 NOTE — Telephone Encounter (Signed)
1 Advair sample left for pt to pick up along with coupon.  Left a detailed msg so pt is aware.

## 2012-01-29 ENCOUNTER — Other Ambulatory Visit: Payer: Self-pay | Admitting: Internal Medicine

## 2012-01-29 DIAGNOSIS — Z1231 Encounter for screening mammogram for malignant neoplasm of breast: Secondary | ICD-10-CM

## 2012-02-12 ENCOUNTER — Telehealth: Payer: Self-pay

## 2012-02-12 DIAGNOSIS — Z0000 Encounter for general adult medical examination without abnormal findings: Secondary | ICD-10-CM

## 2012-02-12 NOTE — Telephone Encounter (Signed)
Ok for cpx labs - to robin to handle

## 2012-02-12 NOTE — Telephone Encounter (Signed)
Ok for cpx labs - robin to handle

## 2012-02-12 NOTE — Telephone Encounter (Signed)
The patient is scheduled for appt. On 02/25/12, does she need to do labs before appt?

## 2012-02-13 NOTE — Telephone Encounter (Signed)
Put lab order in computer and called the patient left detailed message labs have been ordered.

## 2012-02-23 ENCOUNTER — Other Ambulatory Visit (INDEPENDENT_AMBULATORY_CARE_PROVIDER_SITE_OTHER): Payer: 59

## 2012-02-23 DIAGNOSIS — Z0000 Encounter for general adult medical examination without abnormal findings: Secondary | ICD-10-CM

## 2012-02-23 LAB — CBC WITH DIFFERENTIAL/PLATELET
Basophils Absolute: 0 10*3/uL (ref 0.0–0.1)
Basophils Relative: 0.2 % (ref 0.0–3.0)
Eosinophils Absolute: 0.2 10*3/uL (ref 0.0–0.7)
Eosinophils Relative: 1.7 % (ref 0.0–5.0)
HCT: 44 % (ref 36.0–46.0)
Hemoglobin: 14.3 g/dL (ref 12.0–15.0)
Lymphocytes Relative: 22.7 % (ref 12.0–46.0)
Lymphs Abs: 2.2 10*3/uL (ref 0.7–4.0)
MCHC: 32.5 g/dL (ref 30.0–36.0)
MCV: 89.1 fl (ref 78.0–100.0)
Monocytes Absolute: 0.8 10*3/uL (ref 0.1–1.0)
Monocytes Relative: 7.9 % (ref 3.0–12.0)
Neutro Abs: 6.5 10*3/uL (ref 1.4–7.7)
Neutrophils Relative %: 67.5 % (ref 43.0–77.0)
Platelets: 248 10*3/uL (ref 150.0–400.0)
RBC: 4.94 Mil/uL (ref 3.87–5.11)
RDW: 15 % — ABNORMAL HIGH (ref 11.5–14.6)
WBC: 9.7 10*3/uL (ref 4.5–10.5)

## 2012-02-23 LAB — TSH: TSH: 0.93 u[IU]/mL (ref 0.35–5.50)

## 2012-02-23 LAB — HEPATIC FUNCTION PANEL
ALT: 16 U/L (ref 0–35)
AST: 20 U/L (ref 0–37)
Albumin: 3.3 g/dL — ABNORMAL LOW (ref 3.5–5.2)
Alkaline Phosphatase: 81 U/L (ref 39–117)
Bilirubin, Direct: 0.1 mg/dL (ref 0.0–0.3)
Total Bilirubin: 0.5 mg/dL (ref 0.3–1.2)
Total Protein: 7.1 g/dL (ref 6.0–8.3)

## 2012-02-23 LAB — URINALYSIS, ROUTINE W REFLEX MICROSCOPIC
Bilirubin Urine: NEGATIVE
Ketones, ur: NEGATIVE
Leukocytes, UA: NEGATIVE
Nitrite: NEGATIVE
Specific Gravity, Urine: >=1.03 (ref 1.000–1.030)
Total Protein, Urine: NEGATIVE
Urine Glucose: NEGATIVE
Urobilinogen, UA: 0.2 (ref 0.0–1.0)
pH: 6 (ref 5.0–8.0)

## 2012-02-23 LAB — BASIC METABOLIC PANEL
BUN: 19 mg/dL (ref 6–23)
CO2: 29 mEq/L (ref 19–32)
Calcium: 9.4 mg/dL (ref 8.4–10.5)
Chloride: 108 mEq/L (ref 96–112)
Creatinine, Ser: 0.8 mg/dL (ref 0.4–1.2)
GFR: 91.96 mL/min (ref >60.00)
Glucose, Bld: 94 mg/dL (ref 70–99)
Potassium: 3.9 mEq/L (ref 3.5–5.1)
Sodium: 143 mEq/L (ref 135–145)

## 2012-02-23 LAB — LIPID PANEL
Cholesterol: 218 mg/dL — ABNORMAL HIGH (ref 0–200)
HDL: 85.2 mg/dL (ref >39.00)
Total CHOL/HDL Ratio: 3
Triglycerides: 60 mg/dL (ref 0.0–149.0)
VLDL: 12 mg/dL (ref 0.0–40.0)

## 2012-02-23 LAB — LDL CHOLESTEROL, DIRECT: Direct LDL: 107 mg/dL

## 2012-02-24 ENCOUNTER — Telehealth: Payer: Self-pay

## 2012-02-24 NOTE — Telephone Encounter (Signed)
Ok to let pt know - labs are essentially normal, only mild elevated cholesterol  We can discuss further at OV jan 14

## 2012-02-24 NOTE — Telephone Encounter (Signed)
The patient is requesting results of labs done on 02/23/12.  Please advise

## 2012-02-24 NOTE — Telephone Encounter (Signed)
Patient informed. 

## 2012-02-25 ENCOUNTER — Ambulatory Visit (INDEPENDENT_AMBULATORY_CARE_PROVIDER_SITE_OTHER): Payer: PRIVATE HEALTH INSURANCE | Admitting: Critical Care Medicine

## 2012-02-25 ENCOUNTER — Ambulatory Visit (INDEPENDENT_AMBULATORY_CARE_PROVIDER_SITE_OTHER): Payer: PRIVATE HEALTH INSURANCE | Admitting: Internal Medicine

## 2012-02-25 ENCOUNTER — Encounter: Payer: Self-pay | Admitting: Critical Care Medicine

## 2012-02-25 ENCOUNTER — Ambulatory Visit
Admission: RE | Admit: 2012-02-25 | Discharge: 2012-02-25 | Disposition: A | Discharge status: Home / Self Care | Payer: PRIVATE HEALTH INSURANCE | Source: Ambulatory Visit | Attending: Internal Medicine | Admitting: Internal Medicine

## 2012-02-25 ENCOUNTER — Encounter: Payer: Self-pay | Admitting: Internal Medicine

## 2012-02-25 VITALS — BP 140/88 | HR 82 | Temp 97.9°F | Ht 61.0 in | Wt 249.1 lb

## 2012-02-25 VITALS — BP 140/88 | HR 78 | Temp 98.1°F | Ht 61.0 in | Wt 251.2 lb

## 2012-02-25 DIAGNOSIS — K219 Gastro-esophageal reflux disease without esophagitis: Secondary | ICD-10-CM

## 2012-02-25 DIAGNOSIS — Z1231 Encounter for screening mammogram for malignant neoplasm of breast: Secondary | ICD-10-CM

## 2012-02-25 DIAGNOSIS — J45909 Unspecified asthma, uncomplicated: Secondary | ICD-10-CM

## 2012-02-25 DIAGNOSIS — R197 Diarrhea, unspecified: Secondary | ICD-10-CM | POA: Insufficient documentation

## 2012-02-25 DIAGNOSIS — Z0000 Encounter for general adult medical examination without abnormal findings: Secondary | ICD-10-CM

## 2012-02-25 MED ORDER — FLUTICASONE-SALMETEROL 250-50 MCG/DOSE IN AEPB: 1.0000 | INHALATION_SPRAY | Freq: Two times a day (BID) | RESPIRATORY_TRACT | Status: DC

## 2012-02-25 NOTE — Progress Notes (Signed)
Subjective:    Patient ID: [name], female    DOB: 1961-01-08, 52 y.o.   MRN: [id number]  HPI  Here for wellness and f/u;  Overall doing ok;  Pt denies CP, worsening SOB, DOE, wheezing, orthopnea, PND, worsening LE edema, palpitations, dizziness or syncope.  Pt denies neurological change such as new headache, facial or extremity weakness.  Pt denies polydipsia, polyuria, or low sugar symptoms. Pt states overall good compliance with treatment and medications, good tolerability, and has been trying to follow lower cholesterol diet.  Pt denies worsening depressive symptoms, suicidal ideation or panic. No fever, night sweats, wt loss, loss of appetite, or other constitutional symptoms.  Pt states good ability with ADL's, has low fall risk, home safety reviewed and adequate, no other significant changes in hearing or vision, and only occasionally active with exercise.  Incidentally with n/v/d x 1 yesterday, now much improved, no fever, no blood.  Had atrophic vaginitis with rx for cream per GYN recently.  Had right knee TKR per Dr Charlann Boxer, did well, due for left knee TKR in october Past Medical History  Diagnosis Date  . COPD (chronic obstructive pulmonary disease)   . Hypertension   . Disorder of vocal cord   . Asthma   . Obesity (BMI 30-39.9)   . GERD (gastroesophageal reflux disease)   . Sinusitis   . Alcohol abuse   . DJD (degenerative joint disease)     right knee, mot to severe  . Anxiety   . Panic   . History of colon polyps   . History of colonic diverticulitis   . ANXIETY 04/05/2010  . ASTHMA 04/24/2007  . COLONIC POLYPS, HX OF 04/05/2010  . DIVERTICULITIS, HX OF 04/05/2010  . GERD 04/24/2007  . HYPERTENSION 04/24/2007  . OBESITY 04/24/2007  . PALPITATIONS, HX OF 09/14/2007  . VOCAL CORD DISORDER 04/24/2007  . Depression 02/10/2011  . Heart murmur     hx of    Past Surgical History  Procedure Date  . Vesicovaginal fistula closure w/ tah   . Abdominal hysterectomy   . Knee  arthroscopy     left   . Total knee arthroplasty 07/29/2011    Procedure: TOTAL KNEE ARTHROPLASTY;  Surgeon: Shelda Pal, MD;  Location: [facility];  Service: Orthopedics;  Laterality: Right;    reports that she quit smoking about 24 years ago. Her smoking use included Cigarettes. She has a 3.5 pack-year smoking history. She has never used smokeless tobacco. She reports that she drinks alcohol. She reports that she does not use illicit drugs. family history includes Lymphoma in her sister and Stroke in her mother. Allergies  Allergen Reactions  . Morphine Itching  . Shrimp (Shellfish Allergy) Itching  . Latex Rash   Current Outpatient Prescriptions on File Prior to Visit  Medication Sig Dispense Refill  . ADVAIR DISKUS 250-50 MCG/DOSE AEPB Inhale 1 puff into the lungs 2 (two) times daily.  14 each  0  . albuterol (PROAIR HFA) 108 (90 BASE) MCG/ACT inhaler Inhale 2 puffs into the lungs every 4 (four) hours as needed.  1 Inhaler  0  . benzonatate (TESSALON PERLES) 100 MG capsule 1-2 tabs by mouth every 8 hrs as needed for cough  60 capsule  1  . fluocinonide cream (LIDEX) 0.05 % APPLY TOPICALLY 2 (TWO) TIMES DAILY.  30 g  1  . losartan-hydrochlorothiazide (HYZAAR) 100-25 MG per tablet TAKE 1 TABLET BY MOUTH DAILY.  90 tablet  2   Review of  Systems Constitutional: Negative for diaphoresis, activity change, appetite change or unexpected weight change.  HENT: Negative for hearing loss, ear pain, facial swelling, mouth sores and neck stiffness.   Eyes: Negative for pain, redness and visual disturbance.  Respiratory: Negative for shortness of breath and wheezing.   Cardiovascular: Negative for chest pain and palpitations.  Gastrointestinal: Negative for diarrhea, blood in stool, abdominal distention or other pain Genitourinary: Negative for hematuria, flank pain or change in urine volume.  Musculoskeletal: Negative for myalgias and joint swelling.  Skin: Negative for color change and wound.    Neurological: Negative for syncope and numbness. other than noted Hematological: Negative for adenopathy.  Psychiatric/Behavioral: Negative for hallucinations, self-injury, decreased concentration and agitation.      Objective:   Physical Exam BP 140/88  Pulse 82  Temp 97.9 F (36.6 C) (Oral)  Ht 5\' 1"  (1.549 m)  Wt 249 lb 2 oz (113.002 kg)  BMI 47.07 kg/m2  SpO2 97% VS noted,  Constitutional: Pt is oriented to person, place, and time. Appears well-developed and well-nourished./obese  Head: Normocephalic and atraumatic.  Right Ear: External ear normal.  Left Ear: External ear normal.  Nose: Nose normal.  Mouth/Throat: Oropharynx is clear and moist.  Eyes: Conjunctivae and EOM are normal. Pupils are equal, round, and reactive to light.  Neck: Normal range of motion. Neck supple. No JVD present. No tracheal deviation present.  Cardiovascular: Normal rate, regular rhythm, normal heart sounds and intact distal pulses.   Pulmonary/Chest: Effort normal and breath sounds normal.  Abdominal: Soft. Bowel sounds are normal. There is no tenderness. No HSM  Musculoskeletal: Normal range of motion. Exhibits no edema.  Lymphadenopathy:  Has no cervical adenopathy.  Neurological: Pt is alert and oriented to person, place, and time. Pt has normal reflexes. No cranial nerve deficit.  Skin: Skin is warm and dry. No rash noted.  Psychiatric:  Has  normal mood and affect. Behavior is normal.     Assessment & Plan:

## 2012-02-25 NOTE — Patient Instructions (Addendum)
You are given the prilosec OTC samples today Please continue all other medications as before, and refills have been done if requested. Please have the pharmacy call with any other refills you may need. You are given the work note for work missed yesterday and today Your Lab work was OK for this visit Please continue your efforts at being more active, low cholesterol diet, and weight control. You are otherwise up to date with prevention measures today. Please remember to sign up for My Chart if you have not done so, as this will be important to you in the future with finding out test results, communicating by private email, and scheduling acute appointments online when needed. Please return in 1 year for your yearly visit, or sooner if needed, with Lab testing done 3-5 days before Good Luck with your Surgury in October.

## 2012-02-25 NOTE — Assessment & Plan Note (Signed)
Acute and quickly resolved, exam bening, gave note for work

## 2012-02-25 NOTE — Patient Instructions (Addendum)
Increase Advair to one puff twice daily No other medication changes Return 12 months or as needed

## 2012-02-25 NOTE — Assessment & Plan Note (Signed)

## 2012-02-25 NOTE — Assessment & Plan Note (Signed)
Gave several samples prilosec otc

## 2012-02-25 NOTE — Progress Notes (Signed)
Subjective:    Patient ID: [name], female    DOB: 1960/03/15, 52 y.o.   MRN: [id number]  HPI 02/25/2012 Since last OV, insurance changed, now in a work area of lots of strong chemicals.  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  Fiberglass and chemical odors.  Pt just had attic and under house repaired. Mold issues.    Now No wheeze, just dyspneic at work and uses albuterol more and awakens in am with congestion     Review of Systems Constitutional:   No  weight loss, night sweats,  Fevers, chills, fatigue, lassitude. HEENT:   No headaches,  Difficulty swallowing,  Tooth/dental problems,  Sore throat,                No sneezing, itching, ear ache, nasal congestion, post nasal drip,   CV:  No chest pain,  Orthopnea, PND, swelling in lower extremities, anasarca, dizziness, palpitations  GI  No heartburn, indigestion, abdominal pain, nausea, vomiting, diarrhea, change in bowel habits, loss of appetite  Resp: No shortness of breath with exertion or at rest.  No excess mucus, no productive cough,  No non-productive cough,  No coughing up of blood.  No change in color of mucus.  No wheezing.  No chest wall deformity  Skin: no rash or lesions.  GU: no dysuria, change in color of urine, no urgency or frequency.  No flank pain.  MS:  No joint pain or swelling.  No decreased range of motion.  No back pain.  Psych:  No change in mood or affect. No depression or anxiety.  No memory loss.     Objective:   Physical Exam Filed Vitals:   02/25/12 1522  BP: 140/88  Pulse: 78  Temp: 98.1 F (36.7 C)  TempSrc: Oral  Height: 5\' 1"  (1.549 m)  Weight: 251 lb 3.2 oz (113.944 kg)  SpO2: 99%    Gen: Pleasant, well-nourished, in no distress,  normal affect  ENT: No lesions,  mouth clear,  oropharynx clear, no postnasal drip  Neck: No JVD, no TMG, no carotid bruits  Lungs: No use of accessory muscles, no dullness to percussion, clear without rales or rhonchi  Cardiovascular: RRR,  heart sounds normal, no murmur or gallops, no peripheral edema  Abdomen: soft and NT, no HSM,  BS normal  Musculoskeletal: No deformities, no cyanosis or clubbing  Neuro: alert, non focal  Skin: Warm, no lesions or rashes        Assessment & Plan:   Moderate persistent asthma with atopic features Moderate persistent asthma with atopic features stable at this time The patient needs to increase Advair to the typical dose of one puff twice daily   Updated Medication List Outpatient Encounter Prescriptions as of 02/25/2012  Medication Sig Dispense Refill  . albuterol (PROAIR HFA) 108 (90 BASE) MCG/ACT inhaler Inhale 2 puffs into the lungs every 4 (four) hours as needed.  1 Inhaler  0  . fluocinonide cream (LIDEX) 0.05 % APPLY TOPICALLY 2 (TWO) TIMES DAILY.  30 g  1  . Fluticasone-Salmeterol (ADVAIR DISKUS) 250-50 MCG/DOSE AEPB Inhale 1 puff into the lungs 2 (two) times daily. When has samples  60 each  11  . losartan-hydrochlorothiazide (HYZAAR) 100-25 MG per tablet TAKE 1 TABLET BY MOUTH DAILY.  90 tablet  2  . meloxicam (MOBIC) 15 MG tablet Take 1 tablet by mouth daily.      . [DISCONTINUED] ADVAIR DISKUS 250-50 MCG/DOSE AEPB Inhale 1 puff into the  lungs 2 (two) times daily.  14 each  0  . [DISCONTINUED] Fluticasone-Salmeterol (ADVAIR DISKUS) 250-50 MCG/DOSE AEPB Inhale 1 puff into the lungs every other day. When has samples      . Fluticasone-Salmeterol (ADVAIR DISKUS) 250-50 MCG/DOSE AEPB Inhale 1 puff into the lungs 2 (two) times daily.  42 each  0  . [DISCONTINUED] benzonatate (TESSALON PERLES) 100 MG capsule 1-2 tabs by mouth every 8 hrs as needed for cough  60 capsule  1

## 2012-02-26 NOTE — Assessment & Plan Note (Addendum)
Moderate persistent asthma with atopic features stable at this time The patient needs to increase Advair to the typical dose of one puff twice daily

## 2012-03-16 ENCOUNTER — Telehealth: Payer: Self-pay | Admitting: Critical Care Medicine

## 2012-03-16 NOTE — Telephone Encounter (Signed)
lmomtcb x1 for pt 

## 2012-03-16 NOTE — Telephone Encounter (Signed)
Pt returned call. Mindy brought samples and coupon up front. Pt aware. Nothing further needed. Hazel Sams

## 2012-03-30 ENCOUNTER — Telehealth: Payer: Self-pay | Admitting: Critical Care Medicine

## 2012-03-30 NOTE — Telephone Encounter (Signed)
ATC and the line was busy [facility]

## 2012-03-31 MED ORDER — AZITHROMYCIN 250 MG PO TABS: ORAL_TABLET | ORAL | Status: DC

## 2012-03-31 NOTE — Telephone Encounter (Signed)
lmtcb

## 2012-03-31 NOTE — Telephone Encounter (Signed)
Pt returned call.  Pt states she will try back later.  [name]

## 2012-03-31 NOTE — Telephone Encounter (Signed)
Spoke with pt She states that she has been having sinus pressure and PND x several days When she wakes up in the am, her throat feels irritated and ears are "stopped up" Has some clear to blood streaked nasal d/c She states that she has tried taking an tylenol cold and sinus with minimal relief Would like to have abx called in Declined appt Please advise, thanks! Allergies  Allergen Reactions  . Morphine Itching  . Shrimp (Shellfish Allergy) Itching  . Latex Rash

## 2012-03-31 NOTE — Telephone Encounter (Signed)
Call in Z pak 

## 2012-03-31 NOTE — Telephone Encounter (Signed)
LMTCB

## 2012-03-31 NOTE — Telephone Encounter (Signed)
Pt called again, requesting advair coupons - the one she was given was expired. Coupons printed from the internet and mailed to pt's verified home address. Will sign off.

## 2012-03-31 NOTE — Telephone Encounter (Signed)
I spoke with pt and is aware of PW recs. Rx has been sent. Nothing further was needed.

## 2012-05-07 ENCOUNTER — Telehealth: Payer: Self-pay | Admitting: Critical Care Medicine

## 2012-05-07 NOTE — Telephone Encounter (Signed)
Pt is aware that we have samples ready for her to pick up.

## 2012-06-11 ENCOUNTER — Encounter (HOSPITAL_COMMUNITY): Payer: Self-pay | Admitting: *Deleted

## 2012-06-11 ENCOUNTER — Telehealth: Payer: Self-pay | Admitting: *Deleted

## 2012-06-11 ENCOUNTER — Emergency Department (INDEPENDENT_AMBULATORY_CARE_PROVIDER_SITE_OTHER)
Admission: EM | Admit: 2012-06-11 | Discharge: 2012-06-11 | Disposition: A | Discharge status: Home / Self Care | Payer: PRIVATE HEALTH INSURANCE | Source: Home / Self Care | Attending: Family Medicine | Admitting: Family Medicine

## 2012-06-11 DIAGNOSIS — L678 Other hair color and hair shaft abnormalities: Secondary | ICD-10-CM

## 2012-06-11 DIAGNOSIS — L738 Other specified follicular disorders: Secondary | ICD-10-CM

## 2012-06-11 DIAGNOSIS — L731 Pseudofolliculitis barbae: Secondary | ICD-10-CM

## 2012-06-11 NOTE — Telephone Encounter (Signed)
Ok to change advair to 160 symbicort two puff bid

## 2012-06-11 NOTE — Telephone Encounter (Signed)
lmomtcb x1 

## 2012-06-11 NOTE — ED Provider Notes (Signed)
History     CSN: [id number]  Arrival date & time 06/11/12  1800   First MD Initiated Contact with Patient 06/11/12 1820      Chief Complaint  Patient presents with  . Abscess    (Consider location/radiation/quality/duration/timing/severity/associated sxs/prior treatment) Patient is a 52 y.o. female presenting with abscess. The history is provided by the patient.  Abscess Location:  Ano-genital Ano-genital abscess location:  Pelvis Abscess quality: induration and painful   Abscess quality: not draining and no fluctuance   Red streaking: no   Duration:  2 weeks Progression:  Unchanged Pain details:    Severity:  Mild   Progression:  Unchanged   Past Medical History  Diagnosis Date  . COPD (chronic obstructive pulmonary disease)   . Hypertension   . Disorder of vocal cord   . Asthma   . Obesity (BMI 30-39.9)   . GERD (gastroesophageal reflux disease)   . Sinusitis   . Alcohol abuse   . DJD (degenerative joint disease)     right knee, mot to severe  . Anxiety   . Panic   . History of colon polyps   . History of colonic diverticulitis   . ANXIETY 04/05/2010  . ASTHMA 04/24/2007  . COLONIC POLYPS, HX OF 04/05/2010  . DIVERTICULITIS, HX OF 04/05/2010  . GERD 04/24/2007  . HYPERTENSION 04/24/2007  . OBESITY 04/24/2007  . PALPITATIONS, HX OF 09/14/2007  . VOCAL CORD DISORDER 04/24/2007  . Depression 02/10/2011  . Heart murmur     hx of     Past Surgical History  Procedure Laterality Date  . Vesicovaginal fistula closure w/ tah    . Abdominal hysterectomy    . Knee arthroscopy      left   . Total knee arthroplasty  07/29/2011    Procedure: TOTAL KNEE ARTHROPLASTY;  Surgeon: Shelda Pal, MD;  Location: [facility];  Service: Orthopedics;  Laterality: Right;    Family History  Problem Relation Age of Onset  . Lymphoma Sister   . Stroke Mother   . COPD Father     History  Substance Use Topics  . Smoking status: Former Smoker -- 0.50 packs/day for 7 years    Types:  Cigarettes    Quit date: 02/11/1988  . Smokeless tobacco: Never Used  . Alcohol Use: Yes     Comment: occasionally    OB History   Grav Para Term Preterm Abortions TAB SAB Ect Mult Living                  Review of Systems  Skin: Positive for rash.    Allergies  Morphine; Shrimp; and Latex  Home Medications   Current Outpatient Rx  Name  Route  Sig  Dispense  Refill  . albuterol (PROAIR HFA) 108 (90 BASE) MCG/ACT inhaler   Inhalation   Inhale 2 puffs into the lungs every 4 (four) hours as needed.   1 Inhaler   0   . losartan-hydrochlorothiazide (HYZAAR) 100-25 MG per tablet      TAKE 1 TABLET BY MOUTH DAILY.   90 tablet   2   . meloxicam (MOBIC) 15 MG tablet   Oral   Take 1 tablet by mouth daily.         Marland Kitchen azithromycin (ZITHROMAX) 250 MG tablet      Take as directed   6 tablet   0   . fluocinonide cream (LIDEX) 0.05 %      APPLY TOPICALLY 2 (TWO) TIMES  DAILY.   30 g   1   . Fluticasone-Salmeterol (ADVAIR DISKUS) 250-50 MCG/DOSE AEPB   Inhalation   Inhale 1 puff into the lungs 2 (two) times daily. When has samples   60 each   11   . Fluticasone-Salmeterol (ADVAIR DISKUS) 250-50 MCG/DOSE AEPB   Inhalation   Inhale 1 puff into the lungs 2 (two) times daily.   42 each   0     BP 169/89  Pulse 80  Temp(Src) 98.9 F (37.2 C) (Oral)  Resp 16  SpO2 100%  Physical Exam  Nursing note and vitals reviewed. Constitutional: She is oriented to person, place, and time. She appears well-developed and well-nourished.  Neurological: She is alert and oriented to person, place, and time.  Skin: Skin is warm and dry.  Raised papular lesion in pubic area, nonpustular    ED Course  INCISION AND DRAINAGE Date/Time: 06/11/2012 7:20 PM Performed by: Linna Hoff Authorized by: Bradd Canary D Consent: Verbal consent obtained. Consent given by: patient Type: cyst Body area: anogenital Location details: perineum Scalpel size: 11 Incision type: single  straight Complexity: simple Drainage: bloody Drainage amount: scant Wound treatment: wound left open Patient tolerance: Patient tolerated the procedure well with no immediate complications. Comments: Lesion c/w ingrown pubic hair.   (including critical care time)  Labs Reviewed - No data to display No results found.   1. Ingrown hair       MDM          Linna Hoff, MD 06/11/12 1925

## 2012-06-11 NOTE — ED Notes (Signed)
C/o boil on her labia onset 1 1/2 weeks ago.  No drainage.  States her husband tried to bust it today.

## 2012-06-11 NOTE — ED Notes (Signed)
Wound dressed with sterile 2x2's and hypafix.

## 2012-06-11 NOTE — Telephone Encounter (Signed)
Received PA request for Advair 250/[facility] from [facility] [address]. Called [facility] at [phone], spoke with Selena Batten. Symbicort and Dulera are preferred over the advair.   I do not see either on pt's past medication list. Dr. Delford Field, pls advise if Advair can be changed to either the symbicort or dulera.  Thanks.

## 2012-06-14 NOTE — Telephone Encounter (Signed)
LMTCB

## 2012-06-16 NOTE — Telephone Encounter (Signed)
LMTCB on home #. Attempted to call work and cell #'s - neither one are functioning #'s.

## 2012-06-17 ENCOUNTER — Encounter: Payer: Self-pay | Admitting: *Deleted

## 2012-06-17 MED ORDER — BUDESONIDE-FORMOTEROL FUMARATE 160-4.5 MCG/ACT IN AERO: 2.0000 | INHALATION_SPRAY | Freq: Two times a day (BID) | RESPIRATORY_TRACT | Status: DC

## 2012-06-17 NOTE — Telephone Encounter (Signed)
Pt is aware that she does not have COPD. It was listed in her "Past Medical History." This has been deleted per PW.

## 2012-06-17 NOTE — Telephone Encounter (Signed)
[phone] pt is calling back again

## 2012-06-17 NOTE — Telephone Encounter (Signed)
Patient aware that new med will be called into [facility] Symbicort 160 2 puff BID.   Patient questioning something in her medical history--- Pt has mychart and she was looking through and saw on her medical hx that she "has COPD". Pt states that she has never been diagnosed with COPD and she would like to know why this is on her list. If she does have COPD, no one has explained this to her.Patient requesting call back from PW nurse. Pt states call her at # [phone] and leave detailed message.  Dr Delford Field please advise thanks.

## 2012-06-17 NOTE — Telephone Encounter (Signed)
Not sure where she sees this. Her problem list only has asthma on it.  She does NOT have COPD

## 2012-06-22 ENCOUNTER — Telehealth: Payer: Self-pay | Admitting: Critical Care Medicine

## 2012-06-22 MED ORDER — ALBUTEROL SULFATE HFA 108 (90 BASE) MCG/ACT IN AERS: 2.0000 | INHALATION_SPRAY | Freq: Four times a day (QID) | RESPIRATORY_TRACT | Status: DC | PRN

## 2012-06-22 MED ORDER — BUDESONIDE-FORMOTEROL FUMARATE 160-4.5 MCG/ACT IN AERO: 2.0000 | INHALATION_SPRAY | Freq: Two times a day (BID) | RESPIRATORY_TRACT | Status: DC

## 2012-06-22 NOTE — Telephone Encounter (Signed)
Spoke with patient-- Patient states she was recently laid off and is now just starting back working Patient is requesting sample of Symbicort and albuterol inh Samples left upfront patient aware Nothing further needed at this time

## 2012-07-15 ENCOUNTER — Other Ambulatory Visit: Payer: Self-pay | Admitting: Internal Medicine

## 2012-07-16 ENCOUNTER — Telehealth: Payer: Self-pay | Admitting: Critical Care Medicine

## 2012-07-16 MED ORDER — ALBUTEROL SULFATE HFA 108 (90 BASE) MCG/ACT IN AERS: 2.0000 | INHALATION_SPRAY | Freq: Four times a day (QID) | RESPIRATORY_TRACT | Status: DC | PRN

## 2012-07-16 NOTE — Telephone Encounter (Signed)
Called and lmom to make the pt aware that the proair has been sent in to her pharmacy. Nothing further is needed.

## 2012-07-22 ENCOUNTER — Other Ambulatory Visit: Payer: Self-pay | Admitting: Critical Care Medicine

## 2012-07-22 NOTE — Telephone Encounter (Signed)
Albuterol HFA rx sent to Pharm on 07/16/12 # 1 x 1. Called pharm, spoke with [place].  They did receive the rx from 07/16/12.

## 2012-10-18 ENCOUNTER — Telehealth: Payer: Self-pay | Admitting: Critical Care Medicine

## 2012-10-18 NOTE — Telephone Encounter (Signed)
We do not have any samples of either medications at this time I called and spoke with pt and her aware. Nothing further needed

## 2012-10-22 ENCOUNTER — Telehealth: Payer: Self-pay | Admitting: Critical Care Medicine

## 2012-10-22 MED ORDER — ALBUTEROL SULFATE HFA 108 (90 BASE) MCG/ACT IN AERS: 2.0000 | INHALATION_SPRAY | Freq: Four times a day (QID) | RESPIRATORY_TRACT | Status: DC | PRN

## 2012-10-22 NOTE — Telephone Encounter (Signed)
1 sample ventolin up front for pick up  [facility] for the pt to be made aware

## 2012-10-28 ENCOUNTER — Telehealth: Payer: Self-pay

## 2012-10-28 NOTE — Telephone Encounter (Signed)
Faxed to [facility]. At [phone] Surgery clearance form to ok surgery for Left knee surgery.   Attn: Alfonso Ramus.

## 2012-11-15 ENCOUNTER — Telehealth: Payer: Self-pay | Admitting: Critical Care Medicine

## 2012-11-15 MED ORDER — FLUTICASONE-SALMETEROL 250-50 MCG/DOSE IN AEPB: 1.0000 | INHALATION_SPRAY | Freq: Two times a day (BID) | RESPIRATORY_TRACT | Status: DC

## 2012-11-15 NOTE — Telephone Encounter (Signed)
I spoke with pt. Aware 2 samples of the advair left upfront for pick up in [place]. She is also scheduled to see Dr. Delford Field on 11/26/12. Nothing further needed

## 2012-11-24 ENCOUNTER — Ambulatory Visit: Payer: PRIVATE HEALTH INSURANCE | Admitting: Internal Medicine

## 2012-11-26 ENCOUNTER — Ambulatory Visit (INDEPENDENT_AMBULATORY_CARE_PROVIDER_SITE_OTHER): Payer: PRIVATE HEALTH INSURANCE | Admitting: Internal Medicine

## 2012-11-26 ENCOUNTER — Other Ambulatory Visit (INDEPENDENT_AMBULATORY_CARE_PROVIDER_SITE_OTHER): Payer: PRIVATE HEALTH INSURANCE

## 2012-11-26 ENCOUNTER — Encounter: Payer: Self-pay | Admitting: *Deleted

## 2012-11-26 ENCOUNTER — Ambulatory Visit (INDEPENDENT_AMBULATORY_CARE_PROVIDER_SITE_OTHER): Payer: PRIVATE HEALTH INSURANCE | Admitting: Critical Care Medicine

## 2012-11-26 ENCOUNTER — Encounter: Payer: Self-pay | Admitting: Internal Medicine

## 2012-11-26 ENCOUNTER — Encounter: Payer: Self-pay | Admitting: Critical Care Medicine

## 2012-11-26 VITALS — BP 126/84 | HR 104 | Temp 98.4°F | Ht 61.0 in | Wt 257.0 lb

## 2012-11-26 VITALS — BP 114/80 | HR 108 | Temp 97.9°F | Ht 61.0 in | Wt 255.4 lb

## 2012-11-26 DIAGNOSIS — Z01818 Encounter for other preprocedural examination: Secondary | ICD-10-CM

## 2012-11-26 DIAGNOSIS — I10 Essential (primary) hypertension: Secondary | ICD-10-CM

## 2012-11-26 DIAGNOSIS — E049 Nontoxic goiter, unspecified: Secondary | ICD-10-CM | POA: Insufficient documentation

## 2012-11-26 DIAGNOSIS — J45909 Unspecified asthma, uncomplicated: Secondary | ICD-10-CM

## 2012-11-26 LAB — BASIC METABOLIC PANEL
BUN: 22 mg/dL (ref 6–23)
CO2: 29 mEq/L (ref 19–32)
Calcium: 9.4 mg/dL (ref 8.4–10.5)
Chloride: 104 mEq/L (ref 96–112)
Creatinine, Ser: 0.9 mg/dL (ref 0.4–1.2)
GFR: 90.44 mL/min (ref >60.00)
Glucose, Bld: 93 mg/dL (ref 70–99)
Potassium: 3.7 mEq/L (ref 3.5–5.1)
Sodium: 142 mEq/L (ref 135–145)

## 2012-11-26 LAB — HEPATIC FUNCTION PANEL
ALT: 19 U/L (ref 0–35)
AST: 22 U/L (ref 0–37)
Albumin: 3.5 g/dL (ref 3.5–5.2)
Alkaline Phosphatase: 94 U/L (ref 39–117)
Bilirubin, Direct: 0.1 mg/dL (ref 0.0–0.3)
Total Bilirubin: 0.4 mg/dL (ref 0.3–1.2)
Total Protein: 7.6 g/dL (ref 6.0–8.3)

## 2012-11-26 LAB — CBC WITH DIFFERENTIAL/PLATELET
Basophils Absolute: 0.1 10*3/uL (ref 0.0–0.1)
Basophils Relative: 0.7 % (ref 0.0–3.0)
Eosinophils Absolute: 0.3 10*3/uL (ref 0.0–0.7)
Eosinophils Relative: 2.8 % (ref 0.0–5.0)
HCT: 41.8 % (ref 36.0–46.0)
Hemoglobin: 13.9 g/dL (ref 12.0–15.0)
Lymphocytes Relative: 23.5 % (ref 12.0–46.0)
Lymphs Abs: 2.6 10*3/uL (ref 0.7–4.0)
MCHC: 33.4 g/dL (ref 30.0–36.0)
MCV: 87.8 fl (ref 78.0–100.0)
Monocytes Absolute: 0.9 10*3/uL (ref 0.1–1.0)
Monocytes Relative: 7.7 % (ref 3.0–12.0)
Neutro Abs: 7.3 10*3/uL (ref 1.4–7.7)
Neutrophils Relative %: 65.3 % (ref 43.0–77.0)
Platelets: 271 10*3/uL (ref 150.0–400.0)
RBC: 4.76 Mil/uL (ref 3.87–5.11)
RDW: 14.3 % (ref 11.5–14.6)
WBC: 11.2 10*3/uL — ABNORMAL HIGH (ref 4.5–10.5)

## 2012-11-26 MED ORDER — ALBUTEROL SULFATE HFA 108 (90 BASE) MCG/ACT IN AERS: 2.0000 | INHALATION_SPRAY | RESPIRATORY_TRACT | Status: DC | PRN

## 2012-11-26 MED ORDER — FLUTICASONE-SALMETEROL 250-50 MCG/DOSE IN AEPB: 1.0000 | INHALATION_SPRAY | Freq: Two times a day (BID) | RESPIRATORY_TRACT | Status: DC

## 2012-11-26 NOTE — Patient Instructions (Signed)
You are clear for knee surgery No medication changes Return 6 months

## 2012-11-26 NOTE — Assessment & Plan Note (Signed)
Very mild, ? Right nodularity - for thyroid u/s

## 2012-11-26 NOTE — Assessment & Plan Note (Signed)
stable overall by history and exam, recent data reviewed with pt, and pt to continue medical treatment as before,  to f/u any worsening symptoms or concerns BP Readings from Last 3 Encounters:  11/26/12 126/84  11/26/12 114/80  06/11/12 169/89

## 2012-11-26 NOTE — Progress Notes (Signed)
Subjective:    Patient ID: [name], female    DOB: 02-13-1960, 52 y.o.   MRN: [id number]  HPI  11/26/2012 Chief Complaint  Patient presents with  . surgical clearance    ten. sx for 11.4.14 for  L total knee replacement.  Pt denies SOB, cough, chest tightness.  Since last ov. DOing well.  Dr Charlann Boxer is Careers adviser.  Needs R TKR. Now no real cough. Uses inhalers some Review of Systems  Constitutional:   No  weight loss, night sweats,  Fevers, chills, fatigue, lassitude. HEENT:   No headaches,  Difficulty swallowing,  Tooth/dental problems,  Sore throat,                No sneezing, itching, ear ache, nasal congestion, post nasal drip,   CV:  No chest pain,  Orthopnea, PND, swelling in lower extremities, anasarca, dizziness, palpitations  GI  No heartburn, indigestion, abdominal pain, nausea, vomiting, diarrhea, change in bowel habits, loss of appetite  Resp: No shortness of breath with exertion or at rest.  No excess mucus, no productive cough,  No non-productive cough,  No coughing up of blood.  No change in color of mucus.  No wheezing.  No chest wall deformity  Skin: no rash or lesions.  GU: no dysuria, change in color of urine, no urgency or frequency.  No flank pain.  MS:  No joint pain or swelling.  No decreased range of motion.  No back pain.  Psych:  No change in mood or affect. No depression or anxiety.  No memory loss.     Objective:   Physical Exam  Filed Vitals:   11/26/12 1609  BP: 126/84  Pulse: 104  Temp: 98.4 F (36.9 C)  TempSrc: Oral  Height: 5\' 1"  (1.549 m)  Weight: 257 lb (116.574 kg)  SpO2: 97%    Gen: Pleasant, well-nourished, in no distress,  normal affect  ENT: No lesions,  mouth clear,  oropharynx clear, no postnasal drip  Neck: No JVD, no TMG, no carotid bruits  Lungs: No use of accessory muscles, no dullness to percussion, clear without rales or rhonchi  Cardiovascular: RRR, heart sounds normal, no murmur or gallops, no peripheral  edema  Abdomen: soft and NT, no HSM,  BS normal  Musculoskeletal: No deformities, no cyanosis or clubbing  Neuro: alert, non focal  Skin: Warm, no lesions or rashes        Assessment & Plan:   Moderate persistent asthma with atopic features Moderate persistent asthma with atopic features Cleda Daub stable Plan This pt is cleared for surgery Stay on inhaled meds     Updated Medication List Outpatient Encounter Prescriptions as of 11/26/2012  Medication Sig Dispense Refill  . albuterol (PROAIR HFA) 108 (90 BASE) MCG/ACT inhaler Inhale 2 puffs into the lungs every 4 (four) hours as needed.  1 Inhaler  0  . fluocinonide cream (LIDEX) 0.05 % APPLY TOPICALLY 2 (TWO) TIMES DAILY.  30 g  1  . Fluticasone-Salmeterol (ADVAIR DISKUS) 250-50 MCG/DOSE AEPB Inhale 1 puff into the lungs 2 (two) times daily.  2 each  0  . HYDROcodone-acetaminophen (NORCO/VICODIN) 5-325 MG per tablet Take 1 tablet by mouth every 6 (six) hours as needed for pain.      Marland Kitchen losartan-hydrochlorothiazide (HYZAAR) 100-25 MG per tablet TAKE 1 TABLET BY MOUTH DAILY.  90 tablet  2  . meloxicam (MOBIC) 15 MG tablet Take 1 tablet by mouth daily.      . [DISCONTINUED] albuterol (PROAIR HFA)  108 (90 BASE) MCG/ACT inhaler Inhale 2 puffs into the lungs every 4 (four) hours as needed.  1 Inhaler  0  . [DISCONTINUED] budesonide-formoterol (SYMBICORT) 160-4.5 MCG/ACT inhaler Inhale 2 puffs into the lungs 2 (two) times daily.  2 Inhaler  0  . [DISCONTINUED] Fluticasone-Salmeterol (ADVAIR DISKUS) 250-50 MCG/DOSE AEPB Inhale 1 puff into the lungs 2 (two) times daily.  42 each  0  . [DISCONTINUED] albuterol (PROVENTIL HFA;VENTOLIN HFA) 108 (90 BASE) MCG/ACT inhaler Inhale 2 puffs into the lungs every 6 (six) hours as needed for wheezing.  1 Inhaler  1  . [DISCONTINUED] albuterol (VENTOLIN HFA) 108 (90 BASE) MCG/ACT inhaler Inhale 2 puffs into the lungs every 6 (six) hours as needed for wheezing.  1 Inhaler  0  . [DISCONTINUED]  azithromycin (ZITHROMAX) 250 MG tablet Take as directed  6 tablet  0  . [DISCONTINUED] Fluticasone-Salmeterol (ADVAIR DISKUS) 250-50 MCG/DOSE AEPB Inhale 1 puff into the lungs 2 (two) times daily. When has samples  28 each  0   No facility-administered encounter medications on file as of 11/26/2012.

## 2012-11-26 NOTE — Progress Notes (Signed)
Subjective:    Patient ID: [name], female    DOB: 02/01/1961, 52 y.o.   MRN: [id number]  HPI  Here to f/u; overall doing ok,  Pt denies chest pain, increased sob or doe, wheezing, orthopnea, PND, increased LE swelling, palpitations, dizziness or syncope.  Pt denies polydipsia, polyuria, or low sugar symptoms such as weakness or confusion improved with po intake.  Pt denies new neurological symptoms such as new headache, or facial or extremity weakness or numbness.   Pt states overall good compliance with meds, has been trying to follow lower cholesterol, diabetic diet, with wt overall stable,  but little exercise however.    Due for left knee TKR, nov 4.  Has gained form 230 to 255 today with knee pain and less activity, had to give up going to gym. Has right thigh lipoma slightly enlarged but with knee surgury coming up, does not want to address at this time.  Denies hyper or hypo thyroid symptoms such as voice, skin or hair change. Past Medical History  Diagnosis Date  . Hypertension   . Disorder of vocal cord   . Asthma   . Obesity (BMI 30-39.9)   . GERD (gastroesophageal reflux disease)   . Sinusitis   . Alcohol abuse   . DJD (degenerative joint disease)     right knee, mot to severe  . Anxiety   . Panic   . History of colon polyps   . History of colonic diverticulitis   . ANXIETY 04/05/2010  . ASTHMA 04/24/2007  . COLONIC POLYPS, HX OF 04/05/2010  . DIVERTICULITIS, HX OF 04/05/2010  . GERD 04/24/2007  . HYPERTENSION 04/24/2007  . OBESITY 04/24/2007  . PALPITATIONS, HX OF 09/14/2007  . VOCAL CORD DISORDER 04/24/2007  . Depression 02/10/2011  . Heart murmur     hx of    Past Surgical History  Procedure Laterality Date  . Vesicovaginal fistula closure w/ tah    . Abdominal hysterectomy    . Knee arthroscopy      left   . Total knee arthroplasty  07/29/2011    Procedure: TOTAL KNEE ARTHROPLASTY;  Surgeon: Shelda Pal, MD;  Location: [facility];  Service: Orthopedics;   Laterality: Right;    reports that she quit smoking about 24 years ago. Her smoking use included Cigarettes. She has a 3.5 pack-year smoking history. She has never used smokeless tobacco. She reports that she drinks alcohol. She reports that she does not use illicit drugs. family history includes COPD in her father; Lymphoma in her sister; Stroke in her mother. Allergies  Allergen Reactions  . Morphine Itching  . Shrimp [Shellfish Allergy] Itching  . Latex Rash   Current Outpatient Prescriptions on File Prior to Visit  Medication Sig Dispense Refill  . fluocinonide cream (LIDEX) 0.05 % APPLY TOPICALLY 2 (TWO) TIMES DAILY.  30 g  1  . losartan-hydrochlorothiazide (HYZAAR) 100-25 MG per tablet TAKE 1 TABLET BY MOUTH DAILY.  90 tablet  2  . meloxicam (MOBIC) 15 MG tablet Take 1 tablet by mouth daily.      Marland Kitchen albuterol (PROAIR HFA) 108 (90 BASE) MCG/ACT inhaler Inhale 2 puffs into the lungs every 4 (four) hours as needed.  1 Inhaler  0  . Fluticasone-Salmeterol (ADVAIR DISKUS) 250-50 MCG/DOSE AEPB Inhale 1 puff into the lungs 2 (two) times daily.  2 each  0  . HYDROcodone-acetaminophen (NORCO/VICODIN) 5-325 MG per tablet Take 1 tablet by mouth every 6 (six) hours as needed for pain.  No current facility-administered medications on file prior to visit.     Review of Systems  Constitutional: Negative for unexpected weight change, or unusual diaphoresis  HENT: Negative for tinnitus.   Eyes: Negative for photophobia and visual disturbance.  Respiratory: Negative for choking and stridor.   Gastrointestinal: Negative for vomiting and blood in stool.  Genitourinary: Negative for hematuria and decreased urine volume.  Musculoskeletal: Negative for acute joint swelling Skin: Negative for color change and wound.  Neurological: Negative for tremors and numbness other than noted  Psychiatric/Behavioral: Negative for decreased concentration or  hyperactivity.       Objective:   Physical  Exam BP 114/80  Pulse 108  Temp(Src) 97.9 F (36.6 C) (Oral)  Ht 5\' 1"  (1.549 m)  Wt 255 lb 6 oz (115.837 kg)  BMI 48.28 kg/m2  SpO2 94% VS noted,  Constitutional: Pt appears well-developed and well-nourished.  HENT: Head: NCAT.  Right Ear: External ear normal.  Left Ear: External ear normal.  Eyes: Conjunctivae and EOM are normal. Pupils are equal, round, and reactive to light.  Neck: Normal range of motion. Neck supple. ? Mild right thyroid nodular thyroid  Cardiovascular: Normal rate and regular rhythm.   Pulmonary/Chest: Effort normal and breath sounds normal.  Abd:  Soft, NT, non-distended, + BS Neurological: Pt is alert. Not confused  Skin: Skin is warm. No erythema. 4 cm lipoma right medial thigh noted, NT Psychiatric: Pt behavior is normal. Thought content normal.     Assessment & Plan:

## 2012-11-26 NOTE — Assessment & Plan Note (Addendum)
ECG reviewed as per emr, ok for surgury as planned

## 2012-11-26 NOTE — Patient Instructions (Signed)
Please continue all other medications as before, and refills have been done if requested. Please have the pharmacy call with any other refills you may need.  Please call if you wish to be referred at some point to the General Surgeon for the lipoma of the right thigh  Your EKG was OK today  You will be contacted regarding the referral for: thyroid ultrasound  Please go to the LAB in the Basement (turn left off the elevator) for the tests to be done today  You will be contacted by phone if any changes need to be made immediately.  Otherwise, you will receive a letter about your results with an explanation, but please check with MyChart first.  Please keep your appointments with your specialists as you have planned - Dr Delford Field today

## 2012-11-27 NOTE — Assessment & Plan Note (Signed)
Moderate persistent asthma with atopic features [name] stable Plan This pt is cleared for surgery Stay on inhaled meds

## 2012-11-29 ENCOUNTER — Encounter: Payer: Self-pay | Admitting: Internal Medicine

## 2012-11-29 LAB — TSH: TSH: 1.45 u[IU]/mL (ref 0.35–5.50)

## 2012-11-29 LAB — T4, FREE: Free T4: 0.94 ng/dL (ref 0.60–1.60)

## 2012-11-30 ENCOUNTER — Encounter (HOSPITAL_COMMUNITY): Payer: Self-pay | Admitting: Pharmacy Technician

## 2012-12-01 ENCOUNTER — Ambulatory Visit (HOSPITAL_COMMUNITY)
Admission: RE | Admit: 2012-12-01 | Discharge: 2012-12-01 | Disposition: A | Discharge status: Home / Self Care | Payer: PRIVATE HEALTH INSURANCE | Source: Ambulatory Visit | Attending: Orthopedic Surgery | Admitting: Orthopedic Surgery

## 2012-12-01 ENCOUNTER — Encounter (HOSPITAL_COMMUNITY): Payer: Self-pay

## 2012-12-01 ENCOUNTER — Encounter (HOSPITAL_COMMUNITY)
Admission: RE | Admit: 2012-12-01 | Discharge: 2012-12-01 | Disposition: A | Discharge status: Home / Self Care | Payer: PRIVATE HEALTH INSURANCE | Source: Ambulatory Visit | Attending: Orthopedic Surgery | Admitting: Orthopedic Surgery

## 2012-12-01 ENCOUNTER — Ambulatory Visit
Admission: RE | Admit: 2012-12-01 | Discharge: 2012-12-01 | Disposition: A | Discharge status: Home / Self Care | Payer: PRIVATE HEALTH INSURANCE | Source: Ambulatory Visit | Attending: Internal Medicine | Admitting: Internal Medicine

## 2012-12-01 ENCOUNTER — Telehealth: Payer: Self-pay | Admitting: *Deleted

## 2012-12-01 ENCOUNTER — Encounter: Payer: Self-pay | Admitting: Internal Medicine

## 2012-12-01 DIAGNOSIS — Z01818 Encounter for other preprocedural examination: Secondary | ICD-10-CM | POA: Insufficient documentation

## 2012-12-01 DIAGNOSIS — E049 Nontoxic goiter, unspecified: Secondary | ICD-10-CM

## 2012-12-01 DIAGNOSIS — Z01812 Encounter for preprocedural laboratory examination: Secondary | ICD-10-CM | POA: Insufficient documentation

## 2012-12-01 LAB — URINALYSIS, ROUTINE W REFLEX MICROSCOPIC
Bilirubin Urine: NEGATIVE
Glucose, UA: NEGATIVE mg/dL
Hgb urine dipstick: NEGATIVE
Ketones, ur: NEGATIVE mg/dL
Leukocytes, UA: NEGATIVE
Nitrite: NEGATIVE
Protein, ur: NEGATIVE mg/dL
Specific Gravity, Urine: 1.025 (ref 1.005–1.030)
Urobilinogen, UA: 0.2 mg/dL (ref 0.0–1.0)
pH: 7.5 (ref 5.0–8.0)

## 2012-12-01 LAB — PROTIME-INR
INR: 0.96 (ref 0.00–1.49)
Prothrombin Time: 12.6 seconds (ref 11.6–15.2)

## 2012-12-01 LAB — SURGICAL PCR SCREEN
MRSA, PCR: NEGATIVE
Staphylococcus aureus: POSITIVE — AB

## 2012-12-01 LAB — APTT: aPTT: 27 seconds (ref 24–37)

## 2012-12-01 NOTE — Patient Instructions (Addendum)
YOUR SURGERY IS SCHEDULED AT [facility]  ON:  Tuesday  11/4  REPORT TO [place] [facility] AT:  6:00 AM      PHONE # FOR SHORT STAY IS [phone]  DO NOT EAT OR DRINK ANYTHING AFTER MIDNIGHT THE NIGHT BEFORE YOUR SURGERY.  YOU MAY BRUSH YOUR TEETH, RINSE OUT YOUR MOUTH--BUT NO WATER, NO FOOD, NO CHEWING GUM, NO MINTS, NO CANDIES, NO CHEWING TOBACCO.  PLEASE TAKE THE FOLLOWING MEDICATIONS THE AM OF YOUR SURGERY WITH A FEW SIPS OF WATER:   USE YOUR ALBUTEROL AND ADVAIR INHALERS THE AM OF SURGERY. MAY BRING YOUR LIDEX CREAM.  IF YOU USE INHALERS--USE YOUR INHALERS THE AM OF YOUR SURGERY AND BRING INHALERS TO THE HOSPITAL.   DO NOT BRING VALUABLES, MONEY, CREDIT CARDS.  DO NOT WEAR JEWELRY, MAKE-UP, NAIL POLISH AND NO METAL PINS OR CLIPS IN YOUR HAIR. CONTACT LENS, DENTURES / PARTIALS, GLASSES SHOULD NOT BE WORN TO SURGERY AND IN MOST CASES-HEARING AIDS WILL NEED TO BE REMOVED.  BRING YOUR GLASSES CASE, ANY EQUIPMENT NEEDED FOR YOUR CONTACT LENS. FOR PATIENTS ADMITTED TO THE HOSPITAL--CHECK OUT TIME THE DAY OF DISCHARGE IS 11:00 AM.  ALL INPATIENT ROOMS ARE PRIVATE - WITH BATHROOM, TELEPHONE, TELEVISION AND WIFI INTERNET.                            PLEASE READ OVER ANY  FACT SHEETS THAT YOU WERE GIVEN: MRSA INFORMATION, BLOOD TRANSFUSION INFORMATION, INCENTIVE SPIROMETER INFORMATION. FAILURE TO FOLLOW THESE INSTRUCTIONS MAY RESULT IN THE CANCELLATION OF YOUR SURGERY.   PATIENT SIGNATURE_________________________________

## 2012-12-01 NOTE — Telephone Encounter (Signed)
Pt called requesting lab and [place] results.  Left message to return call to [facility].

## 2012-12-01 NOTE — Pre-Procedure Instructions (Addendum)
PT HAD BMET, CBC, EKG DONE 11/26/12 BY DR. Jonny Ruiz - RESULTS IN EPIC.  PT, PTT, UA AND CXR WERE DONE TODAY AT [facility] AS PER ORDERS DR. OLIN AND ANESTHESIOLOGIST'S GUIDELINES. NOTE ON MEDICAL CLEARANCE ON PT'S CHART FROM DR. Jonny Ruiz

## 2012-12-13 NOTE — H&P (Signed)
TOTAL KNEE ADMISSION H&P  Patient is being admitted for left total knee arthroplasty.  Subjective:  Chief Complaint:   Left knee OA / pain.  HPI: [name], 52 y.o. female, has a history of pain and functional disability in the left knee due to arthritis and has failed non-surgical conservative treatments for greater than 12 weeks to includeNSAID's and/or analgesics, corticosteriod injections, viscosupplementation injections and activity modification.  Onset of symptoms was gradual, starting 1.5 years ago with rapidlly worsening course since that time. The patient noted prior procedures on the knee to include  arthroscopy on the left knee(s).  Patient currently rates pain in the left knee(s) at 9 out of 10 with activity. Patient has night pain.  Patient has evidence of periarticular osteophytes and joint space narrowing by imaging studies.  There is no active infection. Risks, benefits and expectations were discussed with the patient. Patient understand the risks, benefits and expectations and wishes to proceed with surgery.   D/C Plans:   SNF  Post-op Meds:    No Rx given  Tranexamic Acid:   To be given  Decadron:    To be given  FYI:    ASA post-op   Patient Active Problem List   Diagnosis Date Noted  . Goiter 11/26/2012  . Preop exam for internal medicine 11/26/2012  . Diarrhea 02/25/2012  . S/P right TKA 07/29/2011  . Colon polyps 02/10/2011  . Eczema 02/10/2011  . Depression 02/10/2011  . Preventative health care 09/27/2010  . ANXIETY 04/05/2010  . DIVERTICULITIS, HX OF 04/05/2010  . PALPITATIONS, HX OF 09/14/2007  . OBESITY 04/24/2007  . HYPERTENSION 04/24/2007  . VOCAL CORD DISORDER 04/24/2007  . Moderate persistent asthma with atopic features 04/24/2007  . GERD 04/24/2007   Past Medical History  Diagnosis Date  . Hypertension   . Asthma   . Obesity (BMI 30-39.9)   . Alcohol abuse     abuse- moderate years ago - states only drinks now on weekends -2 to 8  driinks   . DJD (degenerative joint disease)     right knee, mot to severe  . Anxiety   . Panic   . ANXIETY 04/05/2010  . ASTHMA 04/24/2007  . COLONIC POLYPS, HX OF 04/05/2010  . DIVERTICULITIS, HX OF 04/05/2010  . HYPERTENSION 04/24/2007  . OBESITY 04/24/2007  . PALPITATIONS, HX OF 09/14/2007  . Heart murmur     hx of   . Depression 02/10/2011    20 yrs ago - pt states no longer a problem  . GERD (gastroesophageal reflux disease)     no meds  . GERD 04/24/2007    Past Surgical History  Procedure Laterality Date  . Vesicovaginal fistula closure w/ tah    . Abdominal hysterectomy    . Knee arthroscopy      left   . Total knee arthroplasty  07/29/2011    Procedure: TOTAL KNEE ARTHROPLASTY;  Surgeon: Shelda Pal, MD;  Location: [facility];  Service: Orthopedics;  Laterality: Right;    No prescriptions prior to admission   Allergies  Allergen Reactions  . Morphine Itching  . Shrimp [Shellfish Allergy] Itching  . Latex Rash    History  Substance Use Topics  . Smoking status: Former Smoker -- 0.50 packs/day for 7 years    Types: Cigarettes    Quit date: 02/11/1988  . Smokeless tobacco: Never Used  . Alcohol Use: Yes     Comment: occasionally    Family History  Problem Relation Age  of Onset  . Lymphoma Sister   . Stroke Mother   . COPD Father      Review of Systems  Constitutional: Negative.   HENT: Negative.   Eyes: Negative.   Respiratory: Negative.   Cardiovascular: Negative.   Gastrointestinal: Positive for heartburn and constipation.  Genitourinary: Negative.   Musculoskeletal: Positive for joint pain.  Skin: Negative.   Neurological: Negative.   Endo/Heme/Allergies: Negative.   Psychiatric/Behavioral: Positive for depression. The patient is nervous/anxious.     Objective:  Physical Exam  Constitutional: She is oriented to person, place, and time. She appears well-developed and well-nourished.  HENT:  Head: Normocephalic and atraumatic.  Mouth/Throat:  Oropharynx is clear and moist.  Eyes: Pupils are equal, round, and reactive to light.  Neck: Neck supple. No JVD present. No tracheal deviation present. No thyromegaly present.  Cardiovascular: Normal rate, regular rhythm and intact distal pulses.   Respiratory: Effort normal and breath sounds normal. No stridor. No respiratory distress. She has no wheezes.  GI: Soft. There is no tenderness. There is no guarding.  Musculoskeletal:       Left knee: She exhibits decreased range of motion, swelling and bony tenderness. She exhibits no effusion, no ecchymosis, no deformity, no laceration and no erythema. Tenderness found.  Lymphadenopathy:    She has no cervical adenopathy.  Neurological: She is alert and oriented to person, place, and time.  Skin: Skin is warm and dry.  Psychiatric: She has a normal mood and affect.    Labs:  Estimated body mass index is 47.10 kg/(m^2) as calculated from the following:   Height as of 02/25/12: 5\' 1"  (1.549 m).   Weight as of 02/25/12: 113.002 kg (249 lb 2 oz).   Imaging Review Plain radiographs demonstrate severe degenerative joint disease of the left knee(s). The overall alignment isneutral. The bone quality appears to be good for age and reported activity level.  Assessment/Plan:  End stage arthritis, left knee   The patient history, physical examination, clinical judgment of the provider and imaging studies are consistent with end stage degenerative joint disease of the left knee(s) and total knee arthroplasty is deemed medically necessary. The treatment options including medical management, injection therapy arthroscopy and arthroplasty were discussed at length. The risks and benefits of total knee arthroplasty were presented and reviewed. The risks due to aseptic loosening, infection, stiffness, patella tracking problems, thromboembolic complications and other imponderables were discussed. The patient acknowledged the explanation, agreed to proceed with  the plan and consent was signed. Patient is being admitted for inpatient treatment for surgery, pain control, PT, OT, prophylactic antibiotics, VTE prophylaxis, progressive ambulation and ADL's and discharge planning. The patient is planning to be discharged to skilled nursing facility.   Anastasio Auerbach Raygen Linquist   PAC  12/13/2012, 6:43 PM

## 2012-12-13 NOTE — Anesthesia Preprocedure Evaluation (Addendum)
Anesthesia Evaluation  Patient identified by MRN, date of birth, ID band Patient awake    Reviewed: Allergy & Precautions, H&P , NPO status , Patient's Chart, lab work & pertinent test results  Airway Mallampati: II TM Distance: >3 FB Neck ROM: full    Dental no notable dental hx. (+) Teeth Intact and Dental Advisory Given   Pulmonary asthma ,  Vocal cord disorder breath sounds clear to auscultation  Pulmonary exam normal       Cardiovascular hypertension, Pt. on medications Rhythm:regular Rate:Normal  palpitations   Neuro/Psych Anxiety negative neurological ROS  negative psych ROS   GI/Hepatic negative GI ROS, Neg liver ROS,   Endo/Other  negative endocrine ROSMorbid obesitygoiter  Renal/GU negative Renal ROS  negative genitourinary   Musculoskeletal   Abdominal (+) + obese,   Peds  Hematology negative hematology ROS (+)   Anesthesia Other Findings   Reproductive/Obstetrics negative OB ROS                          Anesthesia Physical Anesthesia Plan  ASA: III  Anesthesia Plan: Spinal   Post-op Pain Management:    Induction:   Airway Management Planned: Simple Face Mask  Additional Equipment:   Intra-op Plan:   Post-operative Plan:   Informed Consent: I have reviewed the patients History and Physical, chart, labs and discussed the procedure including the risks, benefits and alternatives for the proposed anesthesia with the patient or authorized representative who has indicated his/her understanding and acceptance.   Dental Advisory Given  Plan Discussed with: CRNA and Surgeon  Anesthesia Plan Comments:         Anesthesia Quick Evaluation

## 2012-12-14 ENCOUNTER — Encounter (HOSPITAL_COMMUNITY)
Admission: RE | Disposition: A | Discharge status: Skilled Nursing Facility | Payer: Self-pay | Source: Ambulatory Visit | Attending: Orthopedic Surgery

## 2012-12-14 ENCOUNTER — Encounter (HOSPITAL_COMMUNITY): Payer: Self-pay | Admitting: *Deleted

## 2012-12-14 ENCOUNTER — Encounter (HOSPITAL_COMMUNITY): Payer: PRIVATE HEALTH INSURANCE | Admitting: Anesthesiology

## 2012-12-14 ENCOUNTER — Inpatient Hospital Stay (HOSPITAL_COMMUNITY): Payer: PRIVATE HEALTH INSURANCE | Admitting: Anesthesiology

## 2012-12-14 ENCOUNTER — Inpatient Hospital Stay (HOSPITAL_COMMUNITY)
Admission: RE | Admit: 2012-12-14 | Discharge: 2012-12-17 | DRG: 470 | Disposition: A | Discharge status: Skilled Nursing Facility | Payer: PRIVATE HEALTH INSURANCE | Source: Ambulatory Visit | Attending: Orthopedic Surgery | Admitting: Orthopedic Surgery

## 2012-12-14 DIAGNOSIS — Z860100 Personal history of colon polyps, unspecified: Secondary | ICD-10-CM

## 2012-12-14 DIAGNOSIS — D62 Acute posthemorrhagic anemia: Secondary | ICD-10-CM | POA: Diagnosis not present

## 2012-12-14 DIAGNOSIS — Z96652 Presence of left artificial knee joint: Secondary | ICD-10-CM

## 2012-12-14 DIAGNOSIS — R011 Cardiac murmur, unspecified: Secondary | ICD-10-CM | POA: Diagnosis present

## 2012-12-14 DIAGNOSIS — Z87891 Personal history of nicotine dependence: Secondary | ICD-10-CM

## 2012-12-14 DIAGNOSIS — Z6841 Body Mass Index (BMI) 40.0 and over, adult: Secondary | ICD-10-CM

## 2012-12-14 DIAGNOSIS — F411 Generalized anxiety disorder: Secondary | ICD-10-CM | POA: Diagnosis present

## 2012-12-14 DIAGNOSIS — K219 Gastro-esophageal reflux disease without esophagitis: Secondary | ICD-10-CM | POA: Diagnosis present

## 2012-12-14 DIAGNOSIS — Z8719 Personal history of other diseases of the digestive system: Secondary | ICD-10-CM

## 2012-12-14 DIAGNOSIS — F329 Major depressive disorder, single episode, unspecified: Secondary | ICD-10-CM | POA: Diagnosis present

## 2012-12-14 DIAGNOSIS — Z96659 Presence of unspecified artificial knee joint: Secondary | ICD-10-CM

## 2012-12-14 DIAGNOSIS — I10 Essential (primary) hypertension: Secondary | ICD-10-CM | POA: Diagnosis present

## 2012-12-14 DIAGNOSIS — M6580 Other synovitis and tenosynovitis, unspecified site: Secondary | ICD-10-CM | POA: Diagnosis present

## 2012-12-14 DIAGNOSIS — F3289 Other specified depressive episodes: Secondary | ICD-10-CM | POA: Diagnosis present

## 2012-12-14 DIAGNOSIS — D500 Iron deficiency anemia secondary to blood loss (chronic): Secondary | ICD-10-CM | POA: Diagnosis not present

## 2012-12-14 DIAGNOSIS — J45909 Unspecified asthma, uncomplicated: Secondary | ICD-10-CM | POA: Diagnosis present

## 2012-12-14 DIAGNOSIS — M1710 Unilateral primary osteoarthritis, unspecified knee: Principal | ICD-10-CM | POA: Diagnosis present

## 2012-12-14 HISTORY — PX: TOTAL KNEE ARTHROPLASTY: SHX125

## 2012-12-14 LAB — TYPE AND SCREEN
ABO/RH(D): A POS
Antibody Screen: NEGATIVE

## 2012-12-14 SURGERY — ARTHROPLASTY, KNEE, TOTAL
Anesthesia: Spinal | Site: Knee | Laterality: Left | Wound class: Clean

## 2012-12-14 MED ORDER — HYDROCHLOROTHIAZIDE 25 MG PO TABS
25.0000 mg | ORAL_TABLET | Freq: Every day | ORAL | Status: DC
  Administered 2012-12-15 – 2012-12-17 (×3): 25 mg via ORAL
  Filled 2012-12-14 (×3): qty 1

## 2012-12-14 MED ORDER — KETAMINE HCL 50 MG/ML IJ SOLN
INTRAMUSCULAR | Status: DC | PRN
  Administered 2012-12-14 (×3): 25 mg via INTRAMUSCULAR

## 2012-12-14 MED ORDER — ASPIRIN EC 325 MG PO TBEC
325.0000 mg | DELAYED_RELEASE_TABLET | Freq: Two times a day (BID) | ORAL | Status: DC
  Administered 2012-12-15 – 2012-12-17 (×5): 325 mg via ORAL
  Filled 2012-12-14 (×8): qty 1

## 2012-12-14 MED ORDER — METHOCARBAMOL 100 MG/ML IJ SOLN
500.0000 mg | Freq: Four times a day (QID) | INTRAVENOUS | Status: DC | PRN
  Administered 2012-12-14: 500 mg via INTRAVENOUS
  Filled 2012-12-14 (×2): qty 5

## 2012-12-14 MED ORDER — ONDANSETRON HCL 4 MG/2ML IJ SOLN: 4.0000 mg | Freq: Four times a day (QID) | INTRAMUSCULAR | Status: DC | PRN

## 2012-12-14 MED ORDER — ONDANSETRON HCL 4 MG PO TABS: 4.0000 mg | ORAL_TABLET | Freq: Four times a day (QID) | ORAL | Status: DC | PRN

## 2012-12-14 MED ORDER — MOMETASONE FURO-FORMOTEROL FUM 100-5 MCG/ACT IN AERO
2.0000 | INHALATION_SPRAY | Freq: Two times a day (BID) | RESPIRATORY_TRACT | Status: DC
  Filled 2012-12-14 (×2): qty 8.8

## 2012-12-14 MED ORDER — DEXAMETHASONE SODIUM PHOSPHATE 10 MG/ML IJ SOLN
10.0000 mg | Freq: Once | INTRAMUSCULAR | Status: AC
  Administered 2012-12-15: 10 mg via INTRAVENOUS
  Filled 2012-12-14: qty 1

## 2012-12-14 MED ORDER — TRANEXAMIC ACID 100 MG/ML IV SOLN
1000.0000 mg | Freq: Once | INTRAVENOUS | Status: AC
  Administered 2012-12-14: 1000 mg via INTRAVENOUS
  Filled 2012-12-14: qty 10

## 2012-12-14 MED ORDER — PROPOFOL INFUSION 10 MG/ML OPTIME
INTRAVENOUS | Status: DC | PRN
  Administered 2012-12-14: 80 ug/kg/min via INTRAVENOUS

## 2012-12-14 MED ORDER — NAPHAZOLINE HCL 0.1 % OP SOLN
1.0000 [drp] | Freq: Four times a day (QID) | OPHTHALMIC | Status: DC | PRN
  Filled 2012-12-14: qty 15

## 2012-12-14 MED ORDER — FERROUS SULFATE 325 (65 FE) MG PO TABS
325.0000 mg | ORAL_TABLET | Freq: Three times a day (TID) | ORAL | Status: DC
  Administered 2012-12-14 – 2012-12-17 (×9): 325 mg via ORAL
  Filled 2012-12-14 (×12): qty 1

## 2012-12-14 MED ORDER — HYDROMORPHONE HCL PF 1 MG/ML IJ SOLN: 0.5000 mg | INTRAMUSCULAR | Status: DC | PRN

## 2012-12-14 MED ORDER — SODIUM CHLORIDE 0.9 % IV SOLN
INTRAVENOUS | Status: DC
  Administered 2012-12-14 (×2): via INTRAVENOUS
  Filled 2012-12-14 (×10): qty 1000

## 2012-12-14 MED ORDER — LOSARTAN POTASSIUM-HCTZ 100-25 MG PO TABS: 1.0000 | ORAL_TABLET | Freq: Every morning | ORAL | Status: DC

## 2012-12-14 MED ORDER — PHENOL 1.4 % MT LIQD
1.0000 | OROMUCOSAL | Status: DC | PRN
  Filled 2012-12-14: qty 177

## 2012-12-14 MED ORDER — LACTATED RINGERS IV SOLN: INTRAVENOUS | Status: DC

## 2012-12-14 MED ORDER — FLEET ENEMA 7-19 GM/118ML RE ENEM: 1.0000 | ENEMA | Freq: Once | RECTAL | Status: AC | PRN

## 2012-12-14 MED ORDER — METOCLOPRAMIDE HCL 5 MG/ML IJ SOLN
5.0000 mg | Freq: Three times a day (TID) | INTRAMUSCULAR | Status: DC | PRN
Start: 2012-12-14 — End: 2012-12-17

## 2012-12-14 MED ORDER — KETOROLAC TROMETHAMINE 30 MG/ML IJ SOLN
INTRAMUSCULAR | Status: DC | PRN
  Administered 2012-12-14: 30 mg via INTRAVENOUS

## 2012-12-14 MED ORDER — FLUOCINONIDE 0.05 % EX CREA
1.0000 "application " | TOPICAL_CREAM | Freq: Two times a day (BID) | CUTANEOUS | Status: DC | PRN
  Filled 2012-12-14: qty 30

## 2012-12-14 MED ORDER — FENTANYL CITRATE 0.05 MG/ML IJ SOLN
INTRAMUSCULAR | Status: DC | PRN
  Administered 2012-12-14: 50 ug via INTRAVENOUS
  Administered 2012-12-14: 100 ug via INTRAVENOUS
  Administered 2012-12-14: 50 ug via INTRAVENOUS

## 2012-12-14 MED ORDER — DOCUSATE SODIUM 100 MG PO CAPS
100.0000 mg | ORAL_CAPSULE | Freq: Two times a day (BID) | ORAL | Status: DC
  Administered 2012-12-14 – 2012-12-17 (×5): 100 mg via ORAL

## 2012-12-14 MED ORDER — MENTHOL 3 MG MT LOZG
1.0000 | LOZENGE | OROMUCOSAL | Status: DC | PRN
  Filled 2012-12-14: qty 9

## 2012-12-14 MED ORDER — 0.9 % SODIUM CHLORIDE (POUR BTL) OPTIME
TOPICAL | Status: DC | PRN
  Administered 2012-12-14: 1000 mL

## 2012-12-14 MED ORDER — CEFAZOLIN SODIUM-DEXTROSE 2-3 GM-% IV SOLR
2.0000 g | INTRAVENOUS | Status: AC
  Administered 2012-12-14: 2 g via INTRAVENOUS

## 2012-12-14 MED ORDER — BUPIVACAINE-EPINEPHRINE 0.25% -1:200000 IJ SOLN
INTRAMUSCULAR | Status: AC
  Filled 2012-12-14: qty 1

## 2012-12-14 MED ORDER — METOCLOPRAMIDE HCL 10 MG PO TABS: 5.0000 mg | ORAL_TABLET | Freq: Three times a day (TID) | ORAL | Status: DC | PRN

## 2012-12-14 MED ORDER — HYDROMORPHONE HCL PF 1 MG/ML IJ SOLN: 0.2500 mg | INTRAMUSCULAR | Status: DC | PRN

## 2012-12-14 MED ORDER — LACTATED RINGERS IV SOLN
INTRAVENOUS | Status: DC
  Administered 2012-12-14: 10:00:00 via INTRAVENOUS
  Administered 2012-12-14: 1000 mL via INTRAVENOUS

## 2012-12-14 MED ORDER — ALBUTEROL SULFATE HFA 108 (90 BASE) MCG/ACT IN AERS
2.0000 | INHALATION_SPRAY | Freq: Four times a day (QID) | RESPIRATORY_TRACT | Status: DC | PRN
  Filled 2012-12-14: qty 6.7

## 2012-12-14 MED ORDER — CELECOXIB 200 MG PO CAPS
200.0000 mg | ORAL_CAPSULE | Freq: Two times a day (BID) | ORAL | Status: DC
  Administered 2012-12-14 – 2012-12-16 (×5): 200 mg via ORAL
  Filled 2012-12-14 (×7): qty 1

## 2012-12-14 MED ORDER — POLYETHYLENE GLYCOL 3350 17 G PO PACK
17.0000 g | PACK | Freq: Two times a day (BID) | ORAL | Status: DC
  Administered 2012-12-15 – 2012-12-17 (×3): 17 g via ORAL

## 2012-12-14 MED ORDER — SODIUM CHLORIDE 0.9 % IJ SOLN
INTRAMUSCULAR | Status: DC | PRN
  Administered 2012-12-14: 14 mL via INTRAVENOUS

## 2012-12-14 MED ORDER — DEXAMETHASONE SODIUM PHOSPHATE 10 MG/ML IJ SOLN
10.0000 mg | Freq: Once | INTRAMUSCULAR | Status: AC
  Administered 2012-12-14: 10 mg via INTRAVENOUS

## 2012-12-14 MED ORDER — ZOLPIDEM TARTRATE 5 MG PO TABS: 5.0000 mg | ORAL_TABLET | Freq: Every evening | ORAL | Status: DC | PRN

## 2012-12-14 MED ORDER — CHLORHEXIDINE GLUCONATE 4 % EX LIQD: 60.0000 mL | Freq: Once | CUTANEOUS | Status: DC

## 2012-12-14 MED ORDER — METHOCARBAMOL 500 MG PO TABS
500.0000 mg | ORAL_TABLET | Freq: Four times a day (QID) | ORAL | Status: DC | PRN
  Administered 2012-12-14 – 2012-12-17 (×7): 500 mg via ORAL
  Filled 2012-12-14 (×7): qty 1

## 2012-12-14 MED ORDER — BISACODYL 10 MG RE SUPP: 10.0000 mg | Freq: Every day | RECTAL | Status: DC | PRN

## 2012-12-14 MED ORDER — BUPIVACAINE IN DEXTROSE 0.75-8.25 % IT SOLN
INTRATHECAL | Status: DC | PRN
  Administered 2012-12-14: 15 mg via INTRATHECAL

## 2012-12-14 MED ORDER — CEFAZOLIN SODIUM-DEXTROSE 2-3 GM-% IV SOLR
INTRAVENOUS | Status: AC
  Filled 2012-12-14: qty 50

## 2012-12-14 MED ORDER — HYDROCODONE-ACETAMINOPHEN 7.5-325 MG PO TABS
1.0000 | ORAL_TABLET | ORAL | Status: DC
  Administered 2012-12-14 – 2012-12-15 (×7): 2 via ORAL
  Administered 2012-12-15 – 2012-12-16 (×3): 1 via ORAL
  Administered 2012-12-16 – 2012-12-17 (×8): 2 via ORAL
  Filled 2012-12-14 (×19): qty 2

## 2012-12-14 MED ORDER — ALUM & MAG HYDROXIDE-SIMETH 200-200-20 MG/5ML PO SUSP: 30.0000 mL | ORAL | Status: DC | PRN

## 2012-12-14 MED ORDER — MIDAZOLAM HCL 5 MG/5ML IJ SOLN
INTRAMUSCULAR | Status: DC | PRN
  Administered 2012-12-14 (×2): 2 mg via INTRAVENOUS

## 2012-12-14 MED ORDER — LOSARTAN POTASSIUM 50 MG PO TABS
100.0000 mg | ORAL_TABLET | Freq: Every day | ORAL | Status: DC
  Administered 2012-12-15 – 2012-12-17 (×3): 100 mg via ORAL
  Filled 2012-12-14 (×3): qty 2

## 2012-12-14 MED ORDER — SODIUM CHLORIDE 0.9 % IR SOLN
Status: DC | PRN
  Administered 2012-12-14: 1000 mL

## 2012-12-14 MED ORDER — CEFAZOLIN SODIUM-DEXTROSE 2-3 GM-% IV SOLR
2.0000 g | Freq: Four times a day (QID) | INTRAVENOUS | Status: AC
  Administered 2012-12-14 (×2): 2 g via INTRAVENOUS
  Filled 2012-12-14 (×2): qty 50

## 2012-12-14 MED ORDER — DIPHENHYDRAMINE HCL 25 MG PO CAPS: 25.0000 mg | ORAL_CAPSULE | Freq: Four times a day (QID) | ORAL | Status: DC | PRN

## 2012-12-14 MED ORDER — BUPIVACAINE LIPOSOME 1.3 % IJ SUSP
20.0000 mL | Freq: Once | INTRAMUSCULAR | Status: AC
  Administered 2012-12-14: 20 mL
  Filled 2012-12-14: qty 20

## 2012-12-14 SURGICAL SUPPLY — 57 items
ADH SKN CLS APL DERMABOND .7 (GAUZE/BANDAGES/DRESSINGS) ×1
BAG SPEC THK2 15X12 ZIP CLS (MISCELLANEOUS) ×1
BAG ZIPLOCK 12X15 (MISCELLANEOUS) ×2 IMPLANT
BANDAGE ELASTIC 6 VELCRO ST LF (GAUZE/BANDAGES/DRESSINGS) ×2 IMPLANT
BANDAGE ESMARK 6X9 LF (GAUZE/BANDAGES/DRESSINGS) ×1 IMPLANT
BLADE SAW SGTL 13.0X1.19X90.0M (BLADE) ×2 IMPLANT
BNDG CMPR 9X6 STRL LF SNTH (GAUZE/BANDAGES/DRESSINGS) ×1
BNDG ESMARK 6X9 LF (GAUZE/BANDAGES/DRESSINGS) ×2
BOWL SMART MIX CTS (DISPOSABLE) ×2 IMPLANT
CAPT RP KNEE ×1 IMPLANT
CEMENT HV SMART SET (Cement) ×2 IMPLANT
CUFF TOURN SGL QUICK 34 (TOURNIQUET CUFF) ×2
CUFF TRNQT CYL 34X4X40X1 (TOURNIQUET CUFF) ×1 IMPLANT
DECANTER SPIKE VIAL GLASS SM (MISCELLANEOUS) ×2 IMPLANT
DERMABOND ADVANCED (GAUZE/BANDAGES/DRESSINGS) ×1
DERMABOND ADVANCED .7 DNX12 (GAUZE/BANDAGES/DRESSINGS) ×1 IMPLANT
DRAPE EXTREMITY T 121X128X90 (DRAPE) ×2 IMPLANT
DRAPE POUCH INSTRU U-SHP 10X18 (DRAPES) ×2 IMPLANT
DRAPE U-SHAPE 47X51 STRL (DRAPES) ×2 IMPLANT
DRSG AQUACEL AG ADV 3.5X10 (GAUZE/BANDAGES/DRESSINGS) ×2 IMPLANT
DRSG TEGADERM 4X4.75 (GAUZE/BANDAGES/DRESSINGS) ×2 IMPLANT
DURAPREP 26ML APPLICATOR (WOUND CARE) ×4 IMPLANT
ELECT REM PT RETURN 9FT ADLT (ELECTROSURGICAL) ×2
ELECTRODE REM PT RTRN 9FT ADLT (ELECTROSURGICAL) ×1 IMPLANT
EVACUATOR 1/8 PVC DRAIN (DRAIN) ×2 IMPLANT
FACESHIELD LNG OPTICON STERILE (SAFETY) ×10 IMPLANT
GAUZE SPONGE 2X2 8PLY STRL LF (GAUZE/BANDAGES/DRESSINGS) ×1 IMPLANT
GLOVE BIOGEL PI IND STRL 7.5 (GLOVE) ×1 IMPLANT
GLOVE BIOGEL PI IND STRL 8 (GLOVE) ×1 IMPLANT
GLOVE BIOGEL PI INDICATOR 7.5 (GLOVE) ×1
GLOVE BIOGEL PI INDICATOR 8 (GLOVE) ×1
GLOVE ECLIPSE 8.0 STRL XLNG CF (GLOVE) ×2 IMPLANT
GLOVE ORTHO TXT STRL SZ7.5 (GLOVE) ×4 IMPLANT
GOWN BRE IMP PREV XXLGXLNG (GOWN DISPOSABLE) ×2 IMPLANT
GOWN PREVENTION PLUS LG XLONG (DISPOSABLE) ×2 IMPLANT
HANDPIECE INTERPULSE COAX TIP (DISPOSABLE) ×2
KIT BASIN OR (CUSTOM PROCEDURE TRAY) ×2 IMPLANT
MANIFOLD NEPTUNE II (INSTRUMENTS) ×2 IMPLANT
NDL SAFETY ECLIPSE 18X1.5 (NEEDLE) ×1 IMPLANT
NEEDLE HYPO 18GX1.5 SHARP (NEEDLE) ×2
NS IRRIG 1000ML POUR BTL (IV SOLUTION) ×2 IMPLANT
PACK TOTAL JOINT (CUSTOM PROCEDURE TRAY) ×2 IMPLANT
POSITIONER SURGICAL ARM (MISCELLANEOUS) ×2 IMPLANT
SET HNDPC FAN SPRY TIP SCT (DISPOSABLE) ×1 IMPLANT
SET PAD KNEE POSITIONER (MISCELLANEOUS) ×2 IMPLANT
SPONGE GAUZE 2X2 STER 10/PKG (GAUZE/BANDAGES/DRESSINGS) ×1
SUCTION FRAZIER 12FR DISP (SUCTIONS) ×2 IMPLANT
SUT MNCRL AB 4-0 PS2 18 (SUTURE) ×2 IMPLANT
SUT VIC AB 1 CT1 36 (SUTURE) ×2 IMPLANT
SUT VIC AB 2-0 CT1 27 (SUTURE) ×6
SUT VIC AB 2-0 CT1 TAPERPNT 27 (SUTURE) ×3 IMPLANT
SUT VLOC 180 0 24IN GS25 (SUTURE) ×2 IMPLANT
SYR 50ML LL SCALE MARK (SYRINGE) ×2 IMPLANT
TOWEL OR 17X26 10 PK STRL BLUE (TOWEL DISPOSABLE) ×4 IMPLANT
TRAY FOLEY CATH 14FRSI W/METER (CATHETERS) ×2 IMPLANT
WATER STERILE IRR 1500ML POUR (IV SOLUTION) ×2 IMPLANT
WRAP KNEE MAXI GEL POST OP (GAUZE/BANDAGES/DRESSINGS) ×2 IMPLANT

## 2012-12-14 NOTE — Preoperative (Signed)
Beta Blockers   Reason not to administer Beta Blockers:Not Applicable 

## 2012-12-14 NOTE — Progress Notes (Signed)
Clinical Social Work Department CLINICAL SOCIAL WORK PLACEMENT NOTE 12/14/2012  Patient:  [name],Susan A  Account Number:  [id number] Admit date:  12/14/2012  Clinical Social Worker:  Cori Razor, LCSW  Date/time:  12/14/2012 01:33 PM  Clinical Social Work is seeking post-discharge placement for this patient at the following level of care:   SKILLED NURSING   (*CSW will update this form in Epic as items are completed)     Patient/family provided with [facility] Department of Clinical Social Work's list of facilities offering this level of care within the geographic area requested by the patient (or if unable, by the patient's family).  12/14/2012  Patient/family informed of their freedom to choose among providers that offer the needed level of care, that participate in Medicare, Medicaid or managed care program needed by the patient, have an available bed and are willing to accept the patient.    Patient/family informed of [facility]' ownership interest in [facility], as well as of the fact that they are under no obligation to receive care at this facility.  PASARR submitted to [employer] on 12/14/2012 PASARR number received from [employer] on   FL2 transmitted to all facilities in geographic area requested by pt/family on  12/14/2012 FL2 transmitted to all facilities within larger geographic area on 12/14/2012  Patient informed that his/her managed care company has contracts with or will negotiate with  certain facilities, including the following:     Patient/family informed of bed offers received:  12/14/2012 Patient chooses bed at  Physician recommends and patient chooses bed at  [facility]  Patient to be transferred to [facility] on   Patient to be transferred to facility by   The following physician request were entered in Epic:   Additional Comments:  Cori Razor LCSW [phone]

## 2012-12-14 NOTE — Evaluation (Signed)
Physical Therapy Evaluation Patient Details Name: [name] MRN: [id number] DOB: 06/18/1960 Today's Date: 12/14/2012 Time: 8295-6213 PT Time Calculation (min): 44 min  PT Assessment / Plan / Recommendation History of Present Illness  LTKA  Clinical Impression  While ambulating w/RW , after 90 ft, pt reports feeling a "pop" laterally below knee. Pt states more pain related to weight bear. Pt assisted to recliner. RN notified. Pt's ROM appeared with no change, pt able to extend knee and lift leg. Pt will benefit from PT to address problems. Pt plans snf for rehab.    PT Assessment  Patient needs continued PT services    Follow Up Recommendations  SNF    Does the patient have the potential to tolerate intense rehabilitation      Barriers to Discharge        Equipment Recommendations  Rolling walker with 5" wheels    Recommendations for Other Services     Frequency 7X/week    Precautions / Restrictions Precautions Precautions: Knee;Fall   Pertinent Vitals/Pain Pt reports pain mild until "Pop" felt.  Pain lessened after rested. Ice applied.      Mobility  Bed Mobility Bed Mobility: Supine to Sit Supine to Sit: 3: Mod assist;HOB elevated;With rails Details for Bed Mobility Assistance: cues on technique. Transfers Transfers: Sit to Stand;Stand to Sit Sit to Stand: From bed;With upper extremity assist Stand to Sit: 4: Min assist;To chair/3-in-1;With upper extremity assist;With armrests Details for Transfer Assistance: cues for UE use, L leg position prior to sitting down Ambulation/Gait Ambulation/Gait Assistance: 4: Min assist Ambulation Distance (Feet): 90 Feet Assistive device: Rolling walker Ambulation/Gait Assistance Details: cues for sequence. Pt stated she felt a "pop"  at lateral inferior to tibila plateau. Had some increase in pain  when took a few more steps. Pt sat into recliner. Gait Pattern: Step-to pattern;Step-through pattern;Antalgic    Exercises  Total Joint Exercises Ankle Circles/Pumps: AROM;Both;10 reps Quad Sets: AROM;Left;10 reps;Supine Straight Leg Raises: AAROM;Left;5 reps;Supine   PT Diagnosis: Difficulty walking;Acute pain  PT Problem List: Decreased strength;Decreased range of motion;Decreased activity tolerance;Decreased mobility;Decreased safety awareness;Pain;Decreased knowledge of precautions PT Treatment Interventions: DME instruction;Gait training;Functional mobility training;Therapeutic activities;Therapeutic exercise;Patient/family education     PT Goals(Current goals can be found in the care plan section) Acute Rehab PT Goals Patient Stated Goal: I want to walk without pain PT Goal Formulation: With patient/family Time For Goal Achievement: 12/21/12 Potential to Achieve Goals: Good  Visit Information  Last PT Received On: 12/14/12 Assistance Needed: +1 History of Present Illness: LTKA       Prior Functioning  Home Living Family/patient expects to be discharged to:: Skilled nursing facility Living Arrangements: Spouse/significant other Available Help at Discharge: Family Type of Home: House Home Access: Stairs to enter Secretary/administrator of Steps: 4 Entrance Stairs-Rails: Right;Left Home Layout: One level Home Equipment: Medical laboratory scientific officer - single point (loaned out [employer]) Prior Function Level of Independence: Independent with assistive device(s) Communication Communication: No difficulties    Cognition  Cognition Arousal/Alertness: Awake/alert Behavior During Therapy: WFL for tasks assessed/performed Overall Cognitive Status: Within Functional Limits for tasks assessed    Extremity/Trunk Assessment Upper Extremity Assessment Upper Extremity Assessment: Overall WFL for tasks assessed Lower Extremity Assessment Lower Extremity Assessment: LLE deficits/detail LLE Deficits / Details: able to perform SLR, knee flexion to 40*   Balance    End of Session PT - End of Session Activity Tolerance: Patient  tolerated treatment well Patient left: in chair;with call bell/phone within reach;with family/visitor present Nurse Communication: Mobility  status (Pt felt 'Pop" in lower leg below l knee)  GP     Rada Hay 12/14/2012, 4:52 PM Blanchard Kelch PT [phone]

## 2012-12-14 NOTE — Transfer of Care (Signed)
Immediate Anesthesia Transfer of Care Note  Patient: [name]  Procedure(s) Performed: Procedure(s): LEFT TOTAL KNEE ARTHROPLASTY (Left)  Patient Location: PACU  Anesthesia Type:Spinal  Level of Consciousness: awake, alert  and oriented  Airway & Oxygen Therapy: Patient Spontanous Breathing and Patient connected to face mask oxygen  Post-op Assessment: Report given to PACU RN and Post -op Vital signs reviewed and stable  Post vital signs: Reviewed and stable  Complications: No apparent anesthesia complications

## 2012-12-14 NOTE — Anesthesia Procedure Notes (Signed)
Spinal  Patient location during procedure: OR Start time: 12/14/2012 8:46 AM End time: 12/14/2012 8:52 AM Staffing CRNA/Resident: Carmelia Roller R Performed by: resident/CRNA  Spinal Block Patient position: sitting Prep: Betadine Patient monitoring: heart rate, continuous pulse ox and blood pressure Approach: midline Location: L3-4 Injection technique: single-shot Needle Needle type: Sprotte  Needle gauge: 24 G Needle length: 12.7 cm Needle insertion depth: 9 cm Assessment Sensory level: T6

## 2012-12-14 NOTE — Progress Notes (Signed)
Clinical Social Work Department BRIEF PSYCHOSOCIAL ASSESSMENT 12/14/2012  Patient:  [name],[name]     Account Number:  [id number]     Admit date:  12/14/2012  Clinical Social Worker:  Candie Chroman  Date/Time:  12/14/2012 01:22 PM  Referred by:  Physician  Date Referred:  12/14/2012 Referred for  SNF Placement   Other Referral:   Interview type:  Patient Other interview type:    PSYCHOSOCIAL DATA Living Status:  HUSBAND Admitted from facility:   Level of care:   Primary support name:  [name] Primary support relationship to patient:  SPOUSE Degree of support available:   supportive    CURRENT CONCERNS Current Concerns  Post-Acute Placement   Other Concerns:    SOCIAL WORK ASSESSMENT / PLAN Pt is [name] 52 yr old female living at home prior to hospitalization. CSW met with pt / spouse to assist with d/c planning. Pt has made prior arrangements to have ST Rehab at [facility] following hospital d/c. CSW has contacted SNF and d/c plans have been made pending insurance authorization. CSW will continue to follow to assist with d/c planning.   Assessment/plan status:  Psychosocial Support/Ongoing Assessment of Needs Other assessment/ plan:   Information/referral to community resources:   Insurance coverage for SNF to be reviewed once info available.    PATIENT'S/FAMILY'S RESPONSE TO PLAN OF CARE: Pt is looking forward to having rehab at [name].   Cori Razor LCSW [phone]

## 2012-12-14 NOTE — Anesthesia Postprocedure Evaluation (Signed)
  Anesthesia Post-op Note  Patient: [name]  Procedure(s) Performed: Procedure(s) (LRB): LEFT TOTAL KNEE ARTHROPLASTY (Left)  Patient Location: PACU  Anesthesia Type: Spinal  Level of Consciousness: awake and alert   Airway and Oxygen Therapy: Patient Spontanous Breathing  Post-op Pain: mild  Post-op Assessment: Post-op Vital signs reviewed, Patient's Cardiovascular Status Stable, Respiratory Function Stable, Patent Airway and No signs of Nausea or vomiting  Last Vitals:  Filed Vitals:   12/14/12 1100  BP: 118/71  Pulse: 67  Temp: 36.4 C  Resp: 17    Post-op Vital Signs: stable   Complications: No apparent anesthesia complications

## 2012-12-14 NOTE — Op Note (Signed)
NAME:  Susan [name]                      MEDICAL RECORD [name].:  [id number]                             FACILITY:  [facility]      PHYSICIAN:  Madlyn Frankel. Charlann Boxer, M.D.  DATE OF BIRTH:  September 06, 1960      DATE OF PROCEDURE:  12/14/2012                                     OPERATIVE REPORT         PREOPERATIVE DIAGNOSIS:  Left knee osteoarthritis.      POSTOPERATIVE DIAGNOSIS:  Left knee osteoarthritis.      FINDINGS:  The patient was noted to have complete loss of cartilage and   bone-on-bone arthritis with associated osteophytes in all three compartments of   the knee with a significant synovitis and associated effusion.      PROCEDURE:  Left total knee replacement.      COMPONENTS USED:  DePuy rotating platform posterior stabilized knee   system, a size 3 femur, 2.5 tibia, 12.5 mm PS insert, and 35 patellar   button.      SURGEON:  Madlyn Frankel. Charlann Boxer, M.D.      ASSISTANT:  Lanney Gins, PA-C.      ANESTHESIA:  Spinal.      SPECIMENS:  None.      COMPLICATION:  None.      DRAINS:  One Hemovac.  EBL: <100cc      TOURNIQUET TIME:   Total Tourniquet Time Documented: Thigh (Left) - 34 minutes Total: Thigh (Left) - 34 minutes  .      The patient was stable to the recovery room.      INDICATION FOR PROCEDURE:  Susan [name] is a 52 y.o. female patient of   mine.  The patient had been seen, evaluated, and treated conservatively in the   office with medication, activity modification, and injections.  The patient had   radiographic changes of bone-on-bone arthritis with endplate sclerosis and osteophytes noted.      The patient failed conservative measures including medication, injections, and activity modification, and at this point was ready for more definitive measures.   Based on the radiographic changes and failed conservative measures, the patient   decided to proceed with total knee replacement.  Risks of infection,   DVT, component failure, need for revision surgery, postop  course, and   expectations were all   discussed and reviewed.  Consent was obtained for benefit of pain   relief.      PROCEDURE IN DETAIL:  The patient was brought to the operative theater.   Once adequate anesthesia, preoperative antibiotics, 2 gm of Ancef administered, the patient was positioned supine with the left thigh tourniquet placed.  The  left lower extremity was prepped and draped in sterile fashion.  A time-   out was performed identifying the patient, planned procedure, and   extremity.      The left lower extremity was placed in the [facility] leg holder.  The leg was   exsanguinated, tourniquet elevated to 250 mmHg.  A midline incision was   made followed by median parapatellar arthrotomy.  Following initial   exposure, attention was first directed  to the patella.  Precut   measurement was noted to be 22 mm.  I resected down to 13-14 mm and used a   35 patellar button to restore patellar height as well as cover the cut   surface.      The lug holes were drilled and a metal shim was placed to protect the   patella from retractors and saw blades.      At this point, attention was now directed to the femur.  The femoral   canal was opened with a drill, irrigated to try to prevent fat emboli.  An   intramedullary rod was passed at 4 degrees valgus, 10 mm of bone was   resected off the distal femur.  Following this resection, the tibia was   subluxated anteriorly.  Using the extramedullary guide, 4 mm of bone was resected off   the proximal lateral tibia, the most effected side.  We confirmed the gap would be   stable medially and laterally with a 10 mm insert as well as confirmed   the cut was perpendicular in the coronal plane, checking with an alignment rod.      Once this was done, I sized the femur to be a size 3 in the anterior-   posterior dimension, chose a standard component based on medial and   lateral dimension.  The size 3 rotation block was then pinned in    position anterior referenced using the C-clamp to set rotation.  The   anterior, posterior, and  chamfer cuts were made without difficulty nor   notching making certain that I was along the anterior cortex to help   with flexion gap stability.      The final box cut was made off the lateral aspect of distal femur.      At this point, the tibia was sized to be a size 2.5, the size 2.5 tray was   then pinned in position through the medial third of the tubercle,   drilled, and keel punched.  Trial reduction was now carried with a 3 femur,  2.5 tibia, a 12.5 mm insert, and the 35 patella botton.  The knee was brought to   extension, full extension with good flexion stability with the patella   tracking through the trochlea without application of pressure.  Given   all these findings, the trial components removed.  Final components were   opened and cement was mixed.  The knee was irrigated with normal saline   solution and pulse lavage.  The synovial lining was   then injected with 0.25% Marcaine with epinephrine and 1 cc of Toradol,   total of 61 cc.      The knee was irrigated.  Final implants were then cemented onto clean and   dried cut surfaces of bone with the knee brought to extension with a 12.5 mm trial insert.      Once the cement had fully cured, the excess cement was removed   throughout the knee.  I confirmed I was satisfied with the range of   motion and stability, and the final 12.5 mm PS insert was chosen.  It was   placed into the knee.      The tourniquet had been let down at 35 minutes.  [name] significant   hemostasis required.  The medium Hemovac drain was placed deep.  The   extensor mechanism was then reapproximated using #1 Vicryl and 0 V-lock sutures with the knee  in flexion.  The   remaining wound was closed with 2-0 Vicryl and running 4-0 Monocryl.   The knee was cleaned, dried, dressed sterilely using Dermabond and   Aquacel dressing.  Drain site dressed  separately.  The patient was then   brought to recovery room in stable condition, tolerating the procedure   well.   Please note that Physician Assistant, Lanney Gins, was present for the entirety of the case, and was utilized for pre-operative positioning, peri-operative retractor management, general facilitation of the procedure.  He was also utilized for primary wound closure at the end of the case.              Madlyn Frankel Charlann Boxer, M.D.

## 2012-12-14 NOTE — Interval H&P Note (Signed)
History and Physical Interval Note:  12/14/2012 7:00 AM  [name]  has presented today for surgery, with the diagnosis of LEFT KNEE OA  The various methods of treatment have been discussed with the patient and family. After consideration of risks, benefits and other options for treatment, the patient has consented to  Procedure(s): LEFT TOTAL KNEE ARTHROPLASTY (Left) as a surgical intervention .  The patient's history has been reviewed, patient examined, no change in status, stable for surgery.  I have reviewed the patient's chart and labs.  Questions were answered to the patient's satisfaction.     Shelda Pal

## 2012-12-15 DIAGNOSIS — D500 Iron deficiency anemia secondary to blood loss (chronic): Secondary | ICD-10-CM | POA: Diagnosis not present

## 2012-12-15 LAB — BASIC METABOLIC PANEL
BUN: 19 mg/dL (ref 6–23)
CO2: 26 mEq/L (ref 19–32)
Calcium: 9.2 mg/dL (ref 8.4–10.5)
Chloride: 105 mEq/L (ref 96–112)
Creatinine, Ser: 0.69 mg/dL (ref 0.50–1.10)
GFR calc Af Amer: >90 mL/min (ref >90)
GFR calc non Af Amer: >90 mL/min (ref >90)
Glucose, Bld: 163 mg/dL — ABNORMAL HIGH (ref 70–99)
Potassium: 3.2 mEq/L — ABNORMAL LOW (ref 3.5–5.1)
Sodium: 139 mEq/L (ref 135–145)

## 2012-12-15 LAB — CBC
HCT: 35.7 % — ABNORMAL LOW (ref 36.0–46.0)
Hemoglobin: 12.1 g/dL (ref 12.0–15.0)
MCH: 30.3 pg (ref 26.0–34.0)
MCHC: 33.9 g/dL (ref 30.0–36.0)
MCV: 89.5 fL (ref 78.0–100.0)
Platelets: 180 10*3/uL (ref 150–400)
RBC: 3.99 MIL/uL (ref 3.87–5.11)
RDW: 14.1 % (ref 11.5–15.5)
WBC: 14.1 10*3/uL — ABNORMAL HIGH (ref 4.0–10.5)

## 2012-12-15 MED ORDER — FLUTICASONE-SALMETEROL 250-50 MCG/DOSE IN AEPB
1.0000 | INHALATION_SPRAY | Freq: Every day | RESPIRATORY_TRACT | Status: DC
  Administered 2012-12-16 – 2012-12-17 (×2): 1 via RESPIRATORY_TRACT
  Filled 2012-12-15: qty 14

## 2012-12-15 NOTE — Evaluation (Signed)
Occupational Therapy Evaluation Patient Details Name: [name] MRN: [id number] DOB: [birth year] Today's Date: 12/15/2012 Time: 9629-5284 OT Time Calculation (min): 14 min  OT Assessment / Plan / Recommendation History of present illness     Clinical Impression   Pt was admitted for L TKA.  She will benefit from skilled OT to increase safety and independence with adls.   Goals in acute are for supervision level. Pt was modified independent PT.    OT Assessment  Patient needs continued OT Services    Follow Up Recommendations  SNF    Barriers to Discharge      Equipment Recommendations  3 in 1 bedside comode    Recommendations for Other Services    Frequency  Min 2X/week    Precautions / Restrictions Precautions Precautions: Knee;Fall Restrictions Weight Bearing Restrictions: No Other Position/Activity Restrictions: WBAT   Pertinent Vitals/Pain L knee sore when weight bearing.  Repositioned in chair    ADL  Grooming: Wash/dry hands;Supervision/safety Where Assessed - Grooming: Supported standing Upper Body Bathing: Set up Where Assessed - Upper Body Bathing: Supported sitting Lower Body Bathing: Minimal assistance Where Assessed - Lower Body Bathing: Supported sit to stand Upper Body Dressing: Set up Where Assessed - Upper Body Dressing: Supported sitting Lower Body Dressing: Minimal assistance Where Assessed - Lower Body Dressing: Supported sit to Pharmacist, hospital: Minimal assistance;Min guard (min sit to stand) Statistician Method: Sit to Barista: Raised toilet seat with arms (or 3-in-1 over toilet) Toileting - Clothing Manipulation and Hygiene: Min guard (using rail to stand) Where Assessed - Toileting Clothing Manipulation and Hygiene: Sit to stand from 3-in-1 or toilet Equipment Used: Rolling walker Transfers/Ambulation Related to ADLs: ambulated to bathroom with RW with min A ADL Comments: did not introduce AE on this  visit--pt may benefit from this, although she has difficulty lifiting LLE for adls.    OT Diagnosis: Generalized weakness  OT Problem List: Decreased strength;Decreased activity tolerance;Decreased knowledge of use of DME or AE OT Treatment Interventions: Self-care/ADL training;DME and/or AE instruction;Patient/family education;Therapeutic activities   OT Goals(Current goals can be found in the care plan section) Acute Rehab OT Goals Patient Stated Goal: return to being independent OT Goal Formulation: With patient Time For Goal Achievement: 12/22/12 Potential to Achieve Goals: Good ADL Goals Pt Will Perform Lower Body Bathing: with supervision;with adaptive equipment;sit to/from stand Pt Will Perform Lower Body Dressing: with supervision;with adaptive equipment;sit to/from stand Pt Will Transfer to Toilet: with supervision;bedside commode;ambulating Additional ADL Goal #1: pt will gather clothes/adl supplies at a supervision level with RW  Visit Information  Last OT Received On: 12/15/12 Assistance Needed: +1       Prior Functioning     Home Living Family/patient expects to be discharged to:: Skilled nursing facility Additional Comments: pt home alone during the day.  She has a standard commode and tub/shower Prior Function Level of Independence: Independent with assistive device(s) Communication Communication: No difficulties         Vision/Perception     Cognition  Cognition Arousal/Alertness: Awake/alert Behavior During Therapy: WFL for tasks assessed/performed Overall Cognitive Status: Within Functional Limits for tasks assessed    Extremity/Trunk Assessment Upper Extremity Assessment Upper Extremity Assessment: Overall WFL for tasks assessed     Mobility  Transfers Sit to Stand: 4: Min assist;4: Min guard;From chair/3-in-1;With upper extremity assist Stand to Sit: 4: Min assist;To chair/3-in-1;With upper extremity assist;With armrests Details for Transfer  Assistance: cues for use of bil UEs  Exercise    Balance     End of Session OT - End of Session Activity Tolerance: Patient tolerated treatment well Patient left: in chair;with call bell/phone within reach  GO     [name] 12/15/2012, 1:35 PM Marica Otter, OTR/L [phone] 12/15/2012

## 2012-12-15 NOTE — Progress Notes (Signed)
Physical Therapy Treatment Patient Details Name: [name] MRN: [id number] DOB: [birth year] Today's Date: 12/15/2012 Time: 2130-8657 PT Time Calculation (min): 25 min  PT Assessment / Plan / Recommendation  History of Present Illness     PT Comments     Follow Up Recommendations  SNF     Does the patient have the potential to tolerate intense rehabilitation     Barriers to Discharge        Equipment Recommendations  Rolling walker with 5" wheels    Recommendations for Other Services Rehab consult  Frequency 7X/week   Progress towards PT Goals Progress towards PT goals: Progressing toward goals  Plan Current plan remains appropriate    Precautions / Restrictions Precautions Precautions: Knee;Fall Restrictions Weight Bearing Restrictions: No Other Position/Activity Restrictions: WBAT   Pertinent Vitals/Pain 5/10; premed, ice packs provided    Mobility  Bed Mobility Bed Mobility: Supine to Sit Supine to Sit: 4: Min assist Details for Bed Mobility Assistance: cues on technique. Transfers Transfers: Sit to Stand;Stand to Sit Sit to Stand: 4: Min assist;4: Min guard;From bed Stand to Sit: 4: Min assist;To chair/3-in-1;With upper extremity assist;With armrests Details for Transfer Assistance: cues for use of bil UEs Ambulation/Gait Ambulation/Gait Assistance: 4: Min assist;4: Min guard Ambulation Distance (Feet): 79 Feet Assistive device: Rolling walker Ambulation/Gait Assistance Details: cues for posture, sequence and position from RW Gait Pattern: Step-to pattern;Step-through pattern;Antalgic    Exercises Total Joint Exercises Ankle Circles/Pumps: AROM;Both;10 reps Quad Sets: AROM;Left;10 reps;Supine Heel Slides: AAROM;10 reps;Supine;Left Straight Leg Raises: AAROM;AROM;Left;Supine;20 reps   PT Diagnosis:    PT Problem List:   PT Treatment Interventions:     PT Goals (current goals can now be found in the care plan section) Acute Rehab PT Goals Patient  Stated Goal: return to being independent PT Goal Formulation: With patient/family Time For Goal Achievement: 12/21/12 Potential to Achieve Goals: Good  Visit Information  Last PT Received On: 12/15/12 Assistance Needed: +1    Subjective Data  Patient Stated Goal: return to being independent   Cognition  Cognition Arousal/Alertness: Awake/alert Behavior During Therapy: WFL for tasks assessed/performed Overall Cognitive Status: Within Functional Limits for tasks assessed    Balance     End of Session PT - End of Session Equipment Utilized During Treatment: Gait belt Activity Tolerance: Patient tolerated treatment well Patient left: in chair;with call bell/phone within reach;with family/visitor present Nurse Communication: Mobility status   GP     [name] 12/15/2012, 3:42 PM

## 2012-12-15 NOTE — Progress Notes (Addendum)
   Subjective: 1 Day Post-Op Procedure(s) (LRB): LEFT TOTAL KNEE ARTHROPLASTY (Left)   Patient reports pain as mild, pain well controlled. No events throughout the night.   Objective:   VITALS:   Filed Vitals:   12/15/12 0559  BP: 131/85  Pulse: 63  Temp: 97.6 F (36.4 C)  Resp: 16    Neurovascular intact Dorsiflexion/Plantar flexion intact Incision: dressing C/D/I No cellulitis present Compartment soft  LABS  Recent Labs  12/15/12 0505  HGB 12.1  HCT 35.7*  WBC 14.1*  PLT 180     Recent Labs  12/15/12 0505  NA 139  K 3.2*  BUN 19  CREATININE 0.69  GLUCOSE 163*     Assessment/Plan: 1 Day Post-Op Procedure(s) (LRB): LEFT TOTAL KNEE ARTHROPLASTY (Left) HV drain d/c'ed Foley cath d/c'ed Advance diet Up with therapy D/C IV fluids Discharge to SNF eventually, when ready  Morbid Obesity (BMI >40)  Estimated body mass index is 48.36 kg/(m^2) as calculated from the following:   Height as of this encounter: 5\' 1"  (1.549 m).   Weight as of this encounter: 116.03 kg (255 lb 12.8 oz). Patient also counseled that weight may inhibit the healing process Patient counseled that losing weight will help with future health issues  Expected ABLA  Treated with iron and will observe     Anastasio Auerbach. Kaisyn Reinhold   PAC  12/15/2012, 8:29 AM

## 2012-12-15 NOTE — Care Management Note (Signed)
    Page 1 of 1   12/15/2012     10:58:12 AM   CARE MANAGEMENT NOTE 12/15/2012  Patient:  [name],[name]   Account Number:  [id number]  Date Initiated:  12/15/2012  Documentation initiated by:  Colleen Can  Subjective/Objective Assessment:   DX Left knee OA; left total knee replacemnt     Action/Plan:   SNf  rehab. Referral to CSW. CM will follow as needed.   Anticipated DC Date:  12/16/2012   Anticipated DC Plan:  SKILLED NURSING FACILITY  In-house referral  Clinical Social Worker      DC Planning Services  CM consult      Choice offered to / List presented to:             Status of service:  Completed, signed off Medicare Important Message given?   (If response is "NO", the following Medicare IM given date fields will be blank) Date Medicare IM given:   Date Additional Medicare IM given:    Discharge Disposition:    Per UR Regulation:  Reviewed for med. necessity/level of care/duration of stay  If discussed at Long Length of Stay Meetings, dates discussed:    Comments:

## 2012-12-15 NOTE — Progress Notes (Signed)
Physical Therapy Treatment Patient Details Name: AIYANNA AWTREY MRN: [id number] DOB: [birth year] Today's Date: 12/15/2012 Time: 9563-8756 PT Time Calculation (min): 27 min  PT Assessment / Plan / Recommendation  History of Present Illness     PT Comments     Follow Up Recommendations  SNF     Does the patient have the potential to tolerate intense rehabilitation     Barriers to Discharge        Equipment Recommendations  Rolling walker with 5" wheels    Recommendations for Other Services Rehab consult  Frequency 7X/week   Progress towards PT Goals Progress towards PT goals: Progressing toward goals  Plan Current plan remains appropriate    Precautions / Restrictions Precautions Precautions: Knee;Fall Restrictions Weight Bearing Restrictions: No Other Position/Activity Restrictions: WBAT   Pertinent Vitals/Pain 5/10; premed, ice packs provided    Mobility  Bed Mobility Bed Mobility: Supine to Sit;Sit to Supine Supine to Sit: 4: Min assist Sit to Supine: 4: Min assist Details for Bed Mobility Assistance: cues on technique. Transfers Transfers: Sit to Stand;Stand to Sit Sit to Stand: From bed;With upper extremity assist;4: Min assist Stand to Sit: 4: Min assist;To chair/3-in-1;With upper extremity assist;With armrests Details for Transfer Assistance: cues for UE use, L leg position prior to sitting down Ambulation/Gait Ambulation/Gait Assistance: 4: Min assist Ambulation Distance (Feet): 60 Feet Assistive device: Rolling walker Ambulation/Gait Assistance Details: cues for sequence, posture, position from RW Gait Pattern: Step-to pattern;Step-through pattern;Antalgic Stairs: No    Exercises Total Joint Exercises Ankle Circles/Pumps: AROM;Both;10 reps Quad Sets: AROM;Left;10 reps;Supine Heel Slides: AAROM;10 reps;Supine;Left Straight Leg Raises: AAROM;AROM;Left;15 reps;Supine   PT Diagnosis:    PT Problem List:   PT Treatment Interventions:     PT Goals  (current goals can now be found in the care plan section) Acute Rehab PT Goals Patient Stated Goal: I want to walk without pain PT Goal Formulation: With patient/family Time For Goal Achievement: 12/21/12 Potential to Achieve Goals: Good  Visit Information  Last PT Received On: 12/15/12 Assistance Needed: +1    Subjective Data  Patient Stated Goal: I want to walk without pain   Cognition  Cognition Arousal/Alertness: Awake/alert Behavior During Therapy: WFL for tasks assessed/performed Overall Cognitive Status: Within Functional Limits for tasks assessed    Balance     End of Session PT - End of Session Equipment Utilized During Treatment: Gait belt Activity Tolerance: Patient tolerated treatment well Patient left: in chair;with call bell/phone within reach;with family/visitor present Nurse Communication: Mobility status   GP     Eryn Krejci 12/15/2012, 12:56 PM

## 2012-12-16 DIAGNOSIS — D500 Iron deficiency anemia secondary to blood loss (chronic): Secondary | ICD-10-CM | POA: Diagnosis not present

## 2012-12-16 LAB — BASIC METABOLIC PANEL
BUN: 18 mg/dL (ref 6–23)
CO2: 29 mEq/L (ref 19–32)
Calcium: 9.5 mg/dL (ref 8.4–10.5)
Chloride: 103 mEq/L (ref 96–112)
Creatinine, Ser: 0.72 mg/dL (ref 0.50–1.10)
GFR calc Af Amer: >90 mL/min (ref >90)
GFR calc non Af Amer: >90 mL/min (ref >90)
Glucose, Bld: 102 mg/dL — ABNORMAL HIGH (ref 70–99)
Potassium: 3.8 mEq/L (ref 3.5–5.1)
Sodium: 139 mEq/L (ref 135–145)

## 2012-12-16 LAB — CBC
HCT: 35.7 % — ABNORMAL LOW (ref 36.0–46.0)
Hemoglobin: 11.7 g/dL — ABNORMAL LOW (ref 12.0–15.0)
MCH: 29.4 pg (ref 26.0–34.0)
MCHC: 32.8 g/dL (ref 30.0–36.0)
MCV: 89.7 fL (ref 78.0–100.0)
Platelets: 182 10*3/uL (ref 150–400)
RBC: 3.98 MIL/uL (ref 3.87–5.11)
RDW: 14.3 % (ref 11.5–15.5)
WBC: 12.3 10*3/uL — ABNORMAL HIGH (ref 4.0–10.5)

## 2012-12-16 MED ORDER — POLYETHYLENE GLYCOL 3350 17 G PO PACK: 17.0000 g | PACK | Freq: Two times a day (BID) | ORAL | Status: DC

## 2012-12-16 MED ORDER — ASPIRIN 325 MG PO TBEC: 325.0000 mg | DELAYED_RELEASE_TABLET | Freq: Two times a day (BID) | ORAL | Status: AC

## 2012-12-16 MED ORDER — HYDROCODONE-ACETAMINOPHEN 7.5-325 MG PO TABS: 1.0000 | ORAL_TABLET | ORAL | Status: DC | PRN

## 2012-12-16 MED ORDER — DSS 100 MG PO CAPS: 100.0000 mg | ORAL_CAPSULE | Freq: Two times a day (BID) | ORAL | Status: DC

## 2012-12-16 MED ORDER — FERROUS SULFATE 325 (65 FE) MG PO TABS: 325.0000 mg | ORAL_TABLET | Freq: Three times a day (TID) | ORAL | Status: DC

## 2012-12-16 MED ORDER — METHOCARBAMOL 500 MG PO TABS: 500.0000 mg | ORAL_TABLET | Freq: Four times a day (QID) | ORAL | Status: DC | PRN

## 2012-12-16 NOTE — Progress Notes (Signed)
Physical Therapy Treatment Patient Details Name: [name] MRN: [id number] DOB: 04/12/1960 Today's Date: 12/16/2012 Time: 4098-1191 PT Time Calculation (min): 29 min  PT Assessment / Plan / Recommendation  History of Present Illness LTKA   PT Comments   POD # 2 pm session.  Assisted pt with amb then back to bed to perform TKR TE's followed by ICE.    Follow Up Recommendations  SNF     Does the patient have the potential to tolerate intense rehabilitation     Barriers to Discharge        Equipment Recommendations       Recommendations for Other Services    Frequency 7X/week   Progress towards PT Goals Progress towards PT goals: Progressing toward goals  Plan      Precautions / Restrictions Precautions Precautions: Knee;Fall Restrictions Weight Bearing Restrictions: No Other Position/Activity Restrictions: WBAT    Pertinent Vitals/Pain C/o 6/10 ICE applied meds taken     Mobility  Bed Mobility Bed Mobility: Not assessed Details for Bed Mobility Assistance: Pt OOB in recliner Transfers Transfers: Sit to Stand;Stand to Sit Sit to Stand: 4: Min assist;4: Min guard;From chair/3-in-1 Stand to Sit: 4: Min assist;To chair/3-in-1;4: Min guard Details for Transfer Assistance: 25% VC's for proper hand placement and trun completion prior to sit Ambulation/Gait Ambulation/Gait Assistance: 4: Min assist;4: Min Government social research officer (Feet): 82 Feet Assistive device: Rolling walker Ambulation/Gait Assistance Details: 25% VC's for proper walker to self distance and increased time Gait velocity: decreased Stairs: No    Exercises   Total Knee Replacement TE's 10 reps B LE ankle pumps 10 reps knee presses 10 reps heel slides  10 reps SAQ's 10 reps SLR's 10 reps ABD Followed by ICE    PT Goals (current goals can now be found in the care plan section)    Visit Information  Last PT Received On: 12/16/12 Assistance Needed: +1 History of Present Illness: LTKA    Subjective Data      Cognition       Balance     End of Session PT - End of Session Equipment Utilized During Treatment: Gait belt Activity Tolerance: Patient tolerated treatment well Patient left: in chair;with call bell/phone within reach;with family/visitor present Nurse Communication: Mobility status   Felecia Shelling  PTA [facility]  Acute  Rehab Pager      [phone]

## 2012-12-16 NOTE — Progress Notes (Signed)
Patient ID: [name], female   DOB: 09/10/1960, 52 y.o.   MRN: [id number] Subjective: 2 Days Post-Op Procedure(s) (LRB): LEFT TOTAL KNEE ARTHROPLASTY (Left)    Patient reports pain as moderate.  Better than yesterday  Objective:   VITALS:   Filed Vitals:   12/16/12 0546  BP: 151/92  Pulse: 64  Temp: 98.4 F (36.9 C)  Resp: 16    Neurovascular intact Incision: dressing C/D/I  LABS  Recent Labs  12/15/12 0505 12/16/12 0427  HGB 12.1 11.7*  HCT 35.7* 35.7*  WBC 14.1* 12.3*  PLT 180 182     Recent Labs  12/15/12 0505 12/16/12 0427  NA 139 139  K 3.2* 3.8  BUN 19 18  CREATININE 0.69 0.72  GLUCOSE 163* 102*    No results found for this basename: LABPT, INR,  in the last 72 hours   Assessment/Plan: 2 Days Post-Op Procedure(s) (LRB): LEFT TOTAL KNEE ARTHROPLASTY (Left)   Up with therapy Discharge to SNF tomorrow.  Continue therapy  [facility] tomorrow

## 2012-12-16 NOTE — Discharge Summary (Signed)
Physician Discharge Summary  Patient ID: [name] MRN: [id number] DOB/AGE: Mar 24, 1960 52 y.o.  Admit date: 12/14/2012 Discharge date:  12/17/2012   Procedures:  Procedure(s) (LRB): LEFT TOTAL KNEE ARTHROPLASTY (Left)  Attending Physician:  Dr. Durene Romans   Admission Diagnoses:   Left knee OA / pain  Discharge Diagnoses:  Principal Problem:   S/P left TKA Active Problems:   Morbid obesity  Past Medical History  Diagnosis Date  . Hypertension   . Asthma   . Obesity (BMI 30-39.9)   . Alcohol abuse     abuse- moderate years ago - states only drinks now on weekends -2 to 8 driinks   . DJD (degenerative joint disease)     right knee, mot to severe  . Anxiety   . Panic   . COLONIC POLYPS, HX OF 04/05/2010  . DIVERTICULITIS, HX OF 04/05/2010  . PALPITATIONS, HX OF 09/14/2007  . Heart murmur     hx of   . Depression 02/10/2011    20 yrs ago - pt states no longer a problem  . GERD (gastroesophageal reflux disease)     no meds    HPI: [name], 52 y.o. female, has a history of pain and functional disability in the left knee due to arthritis and has failed non-surgical conservative treatments for greater than 12 weeks to includeNSAID's and/or analgesics, corticosteriod injections, viscosupplementation injections and activity modification. Onset of symptoms was gradual, starting 1.5 years ago with rapidlly worsening course since that time. The patient noted prior procedures on the knee to include arthroscopy on the left knee(s). Patient currently rates pain in the left knee(s) at 9 out of 10 with activity. Patient has night pain. Patient has evidence of periarticular osteophytes and joint space narrowing by imaging studies. There is no active infection. Risks, benefits and expectations were discussed with the patient. Patient understand the risks, benefits and expectations and wishes to proceed with surgery.   PCP: Oliver Barre, MD   Discharged Condition:  good  Hospital Course:  Patient underwent the above stated procedure on 12/14/2012. Patient tolerated the procedure well and brought to the recovery room in good condition and subsequently to the floor.  POD #1 BP: 131/85 ; Pulse: 63 ; Temp: 97.6 F (36.4 C) ; Resp: 16 Pt's foley was removed, as well as the hemovac drain removed. IV was changed to a saline lock. Patient reports pain as mild, pain well controlled. No events throughout the night.  Neurovascular intact, dorsiflexion/plantar flexion intact, incision: dressing C/D/I, no cellulitis present and compartment soft.   LABS  Basename    HGB  12.1  HCT  35.7   POD #2  BP: 151/92 ; Pulse: 64 ; Temp: 98.4 F (36.9 C) ; Resp: 16  Patient reports pain as moderate. Better than yesterday Neurovascular intact, dorsiflexion/plantar flexion intact, incision: dressing C/D/I, no cellulitis present and compartment soft.   LABS  Basename    HGB  11.7  HCT  35.7   POD #3  BP: 134/88 ; Pulse: 64 ; Temp: 98.5 F (36.9 C) ; Resp: 16  Patient reports pain as mild. More of an issue is her abdominal pain. She reports history of diverticulitis. BM X 2 during hospitalization. Will recommend liquid diet until symptoms improve, plus will start her on Keflex TID. If her symptoms persist then I have instructed her to primary doc or Gastroenterologist to follow up with any residual pains. Ready to be discharged to SNF. Neurovascular intact, dorsiflexion/plantar flexion  intact, incision: dressing C/D/I, no cellulitis present and compartment soft.   LABS   No new labs  Discharge Exam: General appearance: alert, cooperative and no distress Extremities: Homans sign is negative, no sign of DVT, no edema, redness or tenderness in the calves or thighs and no ulcers, gangrene or trophic changes  Disposition: Skilled nursing facility with follow up in 2 weeks   Follow-up Information   Follow up with Shelda Pal, MD. Schedule an appointment as soon as  possible for a visit in 2 weeks.   Specialty:  Orthopedic Surgery   Contact information:   [address] [place] [address] [phone]      Discharge Orders   Future Orders Complete By Expires   Call MD / Call 911  As directed    Comments:     If you experience chest pain or shortness of breath, CALL 911 and be transported to the hospital emergency room.  If you develope a fever above 101 F, pus (white drainage) or increased drainage or redness at the wound, or calf pain, call your surgeon's office.   Change dressing  As directed    Comments:     Maintain surgical dressing for 10-14 days, then change the dressing daily with sterile 4 x 4 inch gauze dressing and tape. Keep the area dry and clean.   Constipation Prevention  As directed    Comments:     Drink plenty of fluids.  Prune juice may be helpful.  You may use a stool softener, such as Colace (over the counter) 100 mg twice a day.  Use MiraLax (over the counter) for constipation as needed.   Discharge diet:  As directed    Scheduling Instructions:     Liquid diet until bowel pain goes away and then may advance as tolerated.   Discharge instructions  As directed    Comments:     Maintain surgical dressing for 10-14 days, then replace with gauze and tape. Keep the area dry and clean until follow up. Follow up in 2 weeks at [facility]. Call with any questions or concerns.   Increase activity slowly as tolerated  As directed    TED hose  As directed    Comments:     Use stockings (TED hose) for 2 weeks on both leg(s).  You may remove them at night for sleeping.   Weight bearing as tolerated  As directed    Questions:     Laterality:     Extremity:           Medication List    STOP taking these medications       HYDROcodone-acetaminophen 5-325 MG per tablet  Commonly known as:  NORCO/VICODIN  Replaced by:  HYDROcodone-acetaminophen 7.5-325 MG per tablet     meloxicam 15 MG tablet   Commonly known as:  MOBIC      TAKE these medications       albuterol 108 (90 BASE) MCG/ACT inhaler  Commonly known as:  PROVENTIL HFA;VENTOLIN HFA  Inhale 2 puffs into the lungs every 6 (six) hours as needed for wheezing.     aspirin 325 MG EC tablet  Take 1 tablet (325 mg total) by mouth 2 (two) times daily.     cephALEXin 500 MG capsule  Commonly known as:  KEFLEX  Take 1 capsule (500 mg total) by mouth 3 (three) times daily.     DSS 100 MG Caps  Take 100 mg by mouth 2 (two)  times daily.     ferrous sulfate 325 (65 FE) MG tablet  Take 1 tablet (325 mg total) by mouth 3 (three) times daily after meals.     fluocinonide cream 0.05 %  Commonly known as:  LIDEX  Apply 1 application topically 2 (two) times daily as needed (rash).     Fluticasone-Salmeterol 250-50 MCG/DOSE Aepb  Commonly known as:  ADVAIR  Inhale 1 puff into the lungs every morning.     HYDROcodone-acetaminophen 7.5-325 MG per tablet  Commonly known as:  NORCO  Take 1-2 tablets by mouth every 4 (four) hours as needed for moderate pain.     losartan-hydrochlorothiazide 100-25 MG per tablet  Commonly known as:  HYZAAR  Take 1 tablet by mouth every morning.     methocarbamol 500 MG tablet  Commonly known as:  ROBAXIN  Take 1 tablet (500 mg total) by mouth every 6 (six) hours as needed for muscle spasms.     polyethylene glycol packet  Commonly known as:  MIRALAX / GLYCOLAX  Take 17 g by mouth 2 (two) times daily.     tetrahydrozoline 0.05 % ophthalmic solution  Place 1 drop into both eyes 2 (two) times daily as needed ((visine)  for eye redness).         Signed:   Anastasio Auerbach. Ryett Hamman   PAC  12/17/2012, 7:43 AM

## 2012-12-16 NOTE — Progress Notes (Signed)
Physical Therapy Treatment Patient Details Name: [name] MRN: [id number] DOB: August 05, 1960 Today's Date: 12/16/2012 Time: 4098-1191 PT Time Calculation (min): 29 min  PT Assessment / Plan / Recommendation  History of Present Illness LTKA   PT Comments   POD # 2 am session.  Amb in hallway then performed TKR TE's followed by application of ICE.  Pt progressing slowly and plans to D/C to SNF.   Follow Up Recommendations  SNF     Does the patient have the potential to tolerate intense rehabilitation     Barriers to Discharge        Equipment Recommendations       Recommendations for Other Services    Frequency 7X/week   Progress towards PT Goals Progress towards PT goals: Progressing toward goals  Plan      Precautions / Restrictions Precautions Precautions: Knee;Fall Restrictions Weight Bearing Restrictions: No Other Position/Activity Restrictions: WBAT    Pertinent Vitals/Pain C/o 6/10 pain ICE applied    Mobility  Bed Mobility Bed Mobility: Not assessed Details for Bed Mobility Assistance: Pt OOB in recliner Transfers Transfers: Sit to Stand;Stand to Sit Sit to Stand: 4: Min assist;4: Min guard;From chair/3-in-1 Stand to Sit: 4: Min assist;To chair/3-in-1;4: Min guard Details for Transfer Assistance: 25% VC's for proper hand placement and trun completion prior to sit Ambulation/Gait Ambulation/Gait Assistance: 4: Min assist;4: Min Government social research officer (Feet): 82 Feet Assistive device: Rolling walker Ambulation/Gait Assistance Details: 25% VC's for proper walker to self distance and increased time Gait velocity: decreased Stairs: No    Exercises   Total Knee Replacement TE's 10 reps B LE ankle pumps 10 reps knee presses 10 reps heel slides  10 reps SAQ's 10 reps SLR's 10 reps ABD Followed by ICE    PT Goals (current goals can now be found in the care plan section)    Visit Information  Last PT Received On: 12/16/12 Assistance Needed:  +1 History of Present Illness: LTKA    Subjective Data      Cognition       Balance     End of Session PT - End of Session Equipment Utilized During Treatment: Gait belt Activity Tolerance: Patient tolerated treatment well Patient left: in chair;with call bell/phone within reach;with family/visitor present Nurse Communication: Mobility status   Felecia Shelling  PTA [facility]  Acute  Rehab Pager      [phone]

## 2012-12-17 ENCOUNTER — Other Ambulatory Visit: Payer: Self-pay | Admitting: *Deleted

## 2012-12-17 MED ORDER — HYDROCODONE-ACETAMINOPHEN 7.5-325 MG PO TABS: ORAL_TABLET | ORAL | Status: DC

## 2012-12-17 MED ORDER — CEPHALEXIN 500 MG PO CAPS: 500.0000 mg | ORAL_CAPSULE | Freq: Three times a day (TID) | ORAL | Status: DC

## 2012-12-17 NOTE — Progress Notes (Signed)
Physical Therapy Treatment Patient Details Name: [name] MRN: [id number] DOB: November 20, 1960 Today's Date: 12/17/2012 Time: 4098-1191 PT Time Calculation (min): 13 min  PT Assessment / Plan / Recommendation  History of Present Illness LTKA   PT Comments   POD # 3 pt progressing slowly with issues of pain control and MAX c/o ABD distention.  Pt will need ST Rehab at SNF prior to D/C to home.   Follow Up Recommendations  SNF     Does the patient have the potential to tolerate intense rehabilitation     Barriers to Discharge        Equipment Recommendations       Recommendations for Other Services    Frequency 7X/week   Progress towards PT Goals Progress towards PT goals: Progressing toward goals  Plan      Precautions / Restrictions Precautions Precautions: Knee;Fall Restrictions Weight Bearing Restrictions: No Other Position/Activity Restrictions: WBAT    Pertinent Vitals/Pain C/o 8/10 pain    Mobility  Bed Mobility Sit to Supine: 4: Min assist Details for Bed Mobility Assistance: Min assist to support L LE and increased time Transfers Transfers: Sit to Stand;Stand to Sit Sit to Stand: 4: Min assist;4: Min guard;From bed Stand to Sit: 4: Min assist;To chair/3-in-1;4: Min guard Details for Transfer Assistance: 25% VC's for proper hand placement and trun completion prior to sit.   Pt required increased time and demon increased difficulty sit to stand off toilet.  Ambulation/Gait Ambulation/Gait Assistance: 4: Min assist;4: Min Government social research officer (Feet): 65 Feet Assistive device: Rolling walker Ambulation/Gait Assistance Details: 25% VC's on proper sequencing and proper walker to self distance.  Pt also required increased time and demonstrates limited activity tolerance. Gait Pattern: Step-to pattern;Step-through pattern;Antalgic Gait velocity: decreased    Exercises     PT Diagnosis:    PT Problem List:   PT Treatment Interventions:     PT Goals:   Pt will perform bed mobility @ Supervision level (progressing) Pt will transfer @ Supervision level (progressing) Pt will amb @ Supervision level with RW 100' (progressing) Pt will perform TKR TE's at Supervision level (progressing)  Visit Information  Last PT Received On: 12/17/12 Assistance Needed: +1 History of Present Illness: LTKA    Subjective Data      Cognition       Balance     End of Session PT - End of Session Equipment Utilized During Treatment: Gait belt Activity Tolerance: Patient limited by fatigue Patient left: in chair;with call bell/phone within reach;with family/visitor present   Felecia Shelling  PTA [facility]  Acute  Rehab Pager      [phone]   I have reviewed this note and agree with information within including pt goals.  Mauro Kaufmann PT  Pager [phone]

## 2012-12-17 NOTE — Consult Note (Signed)
Spoke to patient and she declined equipment.

## 2012-12-17 NOTE — Progress Notes (Signed)
Patient ID: [name], female   DOB: 11-Aug-1960, 52 y.o.   MRN: [id number] Subjective: 3 Days Post-Op Procedure(s) (LRB): LEFT TOTAL KNEE ARTHROPLASTY (Left)    Patient reports pain as mild. More of an issue is her abdominal pain.  She reports history of diverticulitis. BM X 2 during hospitalization  Objective:   VITALS:   Filed Vitals:   12/17/12 0615  BP: 134/88  Pulse: 64  Temp: 98.5 F (36.9 C)  Resp: 16    Neurovascular intact Incision: dressing C/D/I ACE wrap removed  LABS  Recent Labs  12/15/12 0505 12/16/12 0427  HGB 12.1 11.7*  HCT 35.7* 35.7*  WBC 14.1* 12.3*  PLT 180 182     Recent Labs  12/15/12 0505 12/16/12 0427  NA 139 139  K 3.2* 3.8  BUN 19 18  CREATININE 0.69 0.72  GLUCOSE 163* 102*    No results found for this basename: LABPT, INR,  in the last 72 hours   Assessment/Plan: 3 Days Post-Op Procedure(s) (LRB): LEFT TOTAL KNEE ARTHROPLASTY (Left)   Up with therapy Discharge to SNF today  Will recommend liquid diet until symptoms improve, plus will start her on Keflex TID  If her symptoms persist then I have instructed her to primary doc or Gastroenterologist to follow up with any residual pains  To [facility] for therapy

## 2012-12-17 NOTE — Progress Notes (Signed)
Clinical Social Work Department CLINICAL SOCIAL WORK PLACEMENT NOTE 12/17/2012  Patient:  Evenson,Naziah A  Account Number:  [id number] Admit date:  12/14/2012  Clinical Social Worker:  Cori Razor, LCSW  Date/time:  12/14/2012 01:33 PM  Clinical Social Work is seeking post-discharge placement for this patient at the following level of care:   SKILLED NURSING   (*CSW will update this form in Epic as items are completed)     Patient/family provided with [facility] Department of Clinical Social Work's list of facilities offering this level of care within the geographic area requested by the patient (or if unable, by the patient's family).  12/14/2012  Patient/family informed of their freedom to choose among providers that offer the needed level of care, that participate in Medicare, Medicaid or managed care program needed by the patient, have an available bed and are willing to accept the patient.    Patient/family informed of [facility]' ownership interest in [facility], as well as of the fact that they are under no obligation to receive care at this facility.  PASARR submitted to [employer] on 12/14/2012 PASARR number received from [employer] on   FL2 transmitted to all facilities in geographic area requested by pt/family on  12/14/2012 FL2 transmitted to all facilities within larger geographic area on 12/14/2012  Patient informed that his/her managed care company has contracts with or will negotiate with  certain facilities, including the following:     Patient/family informed of bed offers received:  12/14/2012 Patient chooses bed at  Physician recommends and patient chooses bed at  [facility]  Patient to be transferred to [facility] on  12/17/2012 Patient to be transferred to facility by FAMILY  The following physician request were entered in Epic:   Additional Comments:  Prior authorization provided by insurance.  Cori Razor LCSW [phone]

## 2012-12-17 NOTE — Progress Notes (Signed)
Pt d/c'd to [facility]. Report given to the nurse at [facility]. Pt's spouse to transport her to the facility. D/c packet given to patient and pain medication given.

## 2012-12-20 ENCOUNTER — Encounter: Payer: Self-pay | Admitting: Internal Medicine

## 2012-12-20 ENCOUNTER — Non-Acute Institutional Stay (SKILLED_NURSING_FACILITY): Payer: PRIVATE HEALTH INSURANCE | Admitting: Internal Medicine

## 2012-12-20 ENCOUNTER — Other Ambulatory Visit: Payer: Self-pay | Admitting: *Deleted

## 2012-12-20 DIAGNOSIS — I10 Essential (primary) hypertension: Secondary | ICD-10-CM

## 2012-12-20 DIAGNOSIS — D500 Iron deficiency anemia secondary to blood loss (chronic): Secondary | ICD-10-CM

## 2012-12-20 DIAGNOSIS — Z96652 Presence of left artificial knee joint: Secondary | ICD-10-CM

## 2012-12-20 DIAGNOSIS — J45909 Unspecified asthma, uncomplicated: Secondary | ICD-10-CM

## 2012-12-20 DIAGNOSIS — L309 Dermatitis, unspecified: Secondary | ICD-10-CM

## 2012-12-20 DIAGNOSIS — Z96659 Presence of unspecified artificial knee joint: Secondary | ICD-10-CM

## 2012-12-20 DIAGNOSIS — L259 Unspecified contact dermatitis, unspecified cause: Secondary | ICD-10-CM

## 2012-12-20 MED ORDER — HYDROCODONE-ACETAMINOPHEN 7.5-325 MG PO TABS: ORAL_TABLET | ORAL | Status: DC

## 2012-12-20 NOTE — Assessment & Plan Note (Signed)
Was done for severe arthritis;no complications;pt is planned 2 weeks PT/OT

## 2012-12-20 NOTE — Assessment & Plan Note (Signed)
Pt has advair daily and prn albuterol;stable

## 2012-12-20 NOTE — Assessment & Plan Note (Signed)
H/H 12.7/37.9 on 11/8 improved from 11.7 post surg;pt is on iron

## 2012-12-20 NOTE — Assessment & Plan Note (Signed)
Has appropriate creams

## 2012-12-20 NOTE — Assessment & Plan Note (Signed)
Spoke with pt about albumin of 3.2 and importance of good diet

## 2012-12-20 NOTE — Assessment & Plan Note (Addendum)
Appears to be controlled on current meds-will continue; Pt's LDL 11/8 was 108 and HDL 77 on no meds

## 2012-12-20 NOTE — Progress Notes (Signed)
MRN: [id number] Name: [name]  Sex: female Age: 52 y.o. DOB: 17-Oct-1960  PSC #: [name] Facility/Room: 211 Level Of Care: SNF Provider: Merrilee [name] D Emergency Contacts: Extended Emergency Contact Information Primary Emergency Contact: [name],[name] Address: [address]          [place], [place] [address] [place] Home Phone: [phone] Mobile Phone: [phone] Relation: Spouse  Code Status: FULL  Allergies: Morphine; Shrimp; and Latex  Chief Complaint  Patient presents with  . nursing home admission    HPI: Patient is 52 y.o. female who is admitted for OT/PT after L total knee.  Past Medical History  Diagnosis Date  . Hypertension   . Asthma   . Obesity (BMI 30-39.9)   . Alcohol abuse     abuse- moderate years ago - states only drinks now on weekends -2 to 8 driinks   . DJD (degenerative joint disease)     right knee, mot to severe  . Anxiety   . Panic   . COLONIC POLYPS, HX OF 04/05/2010  . DIVERTICULITIS, HX OF 04/05/2010  . PALPITATIONS, HX OF 09/14/2007  . Heart murmur     hx of   . Depression 02/10/2011    20 yrs ago - pt states no longer a problem  . GERD (gastroesophageal reflux disease)     no meds  . Hyperlipidemia     Past Surgical History  Procedure Laterality Date  . Vesicovaginal fistula closure w/ tah    . Abdominal hysterectomy    . Knee arthroscopy      left   . Total knee arthroplasty  07/29/2011    Procedure: TOTAL KNEE ARTHROPLASTY;  Surgeon: Susan Pal, MD;  Location: [facility];  Service: Orthopedics;  Laterality: Right;  . Total knee arthroplasty Left 12/14/2012    Procedure: LEFT TOTAL KNEE ARTHROPLASTY;  Surgeon: Susan Pal, MD;  Location: [facility];  Service: Orthopedics;  Laterality: Left;      Medication List       This list is accurate as of: 12/20/12  3:08 PM.  Always use your most recent med list.               albuterol 108 (90 BASE) MCG/ACT inhaler  Commonly known as:  PROVENTIL  HFA;VENTOLIN HFA  Inhale 2 puffs into the lungs every 6 (six) hours as needed for wheezing.     aspirin 325 MG EC tablet  Take 1 tablet (325 mg total) by mouth 2 (two) times daily.     cephALEXin 500 MG capsule  Commonly known as:  KEFLEX  Take 1 capsule (500 mg total) by mouth 3 (three) times daily.     DSS 100 MG Caps  Take 100 mg by mouth 2 (two) times daily.     ferrous sulfate 325 (65 FE) MG tablet  Take 1 tablet (325 mg total) by mouth 3 (three) times daily after meals.     fluocinonide cream 0.05 %  Commonly known as:  LIDEX  Apply 1 application topically 2 (two) times daily as needed (rash).     Fluticasone-Salmeterol 250-50 MCG/DOSE Aepb  Commonly known as:  ADVAIR  Inhale 1 puff into the lungs every morning.     HYDROcodone-acetaminophen 7.5-325 MG per tablet  Commonly known as:  NORCO  Take one tablet by mouth every four hours as needed for mild pain 1-5; Take two tablets by mouth every four hours as needed for severe pain     losartan-hydrochlorothiazide 100-25  MG per tablet  Commonly known as:  HYZAAR  Take 1 tablet by mouth every morning.     methocarbamol 500 MG tablet  Commonly known as:  ROBAXIN  Take 1 tablet (500 mg total) by mouth every 6 (six) hours as needed for muscle spasms.     polyethylene glycol packet  Commonly known as:  MIRALAX / GLYCOLAX  Take 17 g by mouth 2 (two) times daily.     tetrahydrozoline 0.05 % ophthalmic solution  Place 1 drop into both eyes 2 (two) times daily as needed ((visine)  for eye redness).        No orders of the defined types were placed in this encounter.    Immunization History  Administered Date(s) Administered  . Td 02/10/2002    History  Substance Use Topics  . Smoking status: Former Smoker -- 0.50 packs/day for 7 years    Types: Cigarettes    Quit date: 02/11/1988  . Smokeless tobacco: Never Used  . Alcohol Use: Yes     Comment: occasionally    Family history is noncontributory    Review of  Systems  DATA OBTAINED: from patient GENERAL: Feels well no fevers, fatigue, appetite changes SKIN: No itching, rash or wounds EYES: No eye pain, redness, discharge EARS: No earache, tinnitus, change in hearing NOSE: No congestion, drainage or bleeding  MOUTH/THROAT: No mouth or tooth pain, No sore throat, No difficulty chewing or swallowing  RESPIRATORY: No cough, wheezing, SOB CARDIAC: No chest pain, palpitations, lower extremity edema  GI: No abdominal pain, No N/V/D or constipation, No heartburn or reflux  GU: No dysuria, frequency or urgency, or incontinence  MUSCULOSKELETAL: No unrelieved bone/joint pain; WOULD LIKE TO HAVE ICE FOR HER KNEE NEUROLOGIC: No headache, dizziness or focal weakness PSYCHIATRIC: No overt anxiety or sadness. Sleeps well. No behavior issue.   Filed Vitals:   12/20/12 1106  BP: 110/73  Pulse: 89  Temp: 97.6 F (36.4 C)  Resp: 18    Physical Exam  GENERAL APPEARANCE: Alert, conversant. Appropriately groomed. No acute distress.  SKIN: No diaphoresis rash, or wounds HEAD: Normocephalic, atraumatic  EYES: Conjunctiva/lids clear. Pupils round, reactive. EOMs intact.  EARS: External exam WNL, canals clear. Hearing grossly normal.  NOSE: No deformity or discharge.  MOUTH/THROAT: Lips w/o lesions.  RESPIRATORY: Breathing is even, unlabored. Lung sounds are clear   CARDIOVASCULAR: Heart RRR no murmurs, rubs or gallops. No peripheral edema ON R, TRACE ON L.  GASTROINTESTINAL: Abdomen is soft, non-tender, not distended w/ normal bowel sounds GENITOURINARY: Bladder non tender, not distended  MUSCULOSKELETALDRESSING l KNEE NEUROLOGIC: Oriented X3. Cranial nerves 2-12 grossly intact. Moves all extremities no tremor. PSYCHIATRIC: Mood and affect appropriate to situation, no behavioral issues  Patient Active Problem List   Diagnosis Date Noted  . Expected blood loss anemia 12/16/2012  . Morbid obesity 12/15/2012  . S/P left TKA 12/14/2012  . Goiter  11/26/2012  . Preop exam for internal medicine 11/26/2012  . Diarrhea 02/25/2012  . Colon polyps 02/10/2011  . Eczema 02/10/2011  . Depression 02/10/2011  . Preventative health care 09/27/2010  . ANXIETY 04/05/2010  . DIVERTICULITIS, HX OF 04/05/2010  . PALPITATIONS, HX OF 09/14/2007  . OBESITY 04/24/2007  . HYPERTENSION 04/24/2007  . VOCAL CORD DISORDER 04/24/2007  . Moderate persistent asthma with atopic features 04/24/2007  . GERD 04/24/2007    CBC    Component Value Date/Time   WBC 12.3* 12/16/2012 0427   RBC 3.98 12/16/2012 0427   HGB 11.7* 12/16/2012  0427   HCT 35.7* 12/16/2012 0427   PLT 182 12/16/2012 0427   MCV 89.7 12/16/2012 0427   LYMPHSABS 2.6 11/26/2012 1717   MONOABS 0.9 11/26/2012 1717   EOSABS 0.3 11/26/2012 1717   BASOSABS 0.1 11/26/2012 1717    CMP     Component Value Date/Time   NA 139 12/16/2012 0427   K 3.8 12/16/2012 0427   CL 103 12/16/2012 0427   CO2 29 12/16/2012 0427   GLUCOSE 102* 12/16/2012 0427   BUN 18 12/16/2012 0427   CREATININE 0.72 12/16/2012 0427   CALCIUM 9.5 12/16/2012 0427   PROT 7.6 11/26/2012 1717   ALBUMIN 3.5 11/26/2012 1717   AST 22 11/26/2012 1717   ALT 19 11/26/2012 1717   ALKPHOS 94 11/26/2012 1717   BILITOT 0.4 11/26/2012 1717   GFRNONAA >90 12/16/2012 0427   GFRAA >90 12/16/2012 0427    Assessment and Plan  S/P left TKA Was done for severe arthritis;no complications;pt is planned 2 weeks PT/OT  HYPERTENSION Appears to be controlled on current meds-will continue; Pt's LDL 11/8 was 108 and HDL 77 on no meds  Moderate persistent asthma with atopic features Pt has advair daily and prn albuterol;stable  Morbid obesity Spoke with pt about albumin of 3.2 and importance of good diet  Eczema Has appropriate creams  Expected blood loss anemia H/H 12.7/37.9 on 11/8 improved from 11.7 post surg;pt is on iron    Margit Hanks, MD

## 2012-12-28 ENCOUNTER — Non-Acute Institutional Stay (SKILLED_NURSING_FACILITY): Payer: PRIVATE HEALTH INSURANCE | Admitting: Nurse Practitioner

## 2012-12-28 DIAGNOSIS — Z96652 Presence of left artificial knee joint: Secondary | ICD-10-CM

## 2012-12-28 DIAGNOSIS — I10 Essential (primary) hypertension: Secondary | ICD-10-CM

## 2012-12-28 DIAGNOSIS — M1712 Unilateral primary osteoarthritis, left knee: Secondary | ICD-10-CM

## 2012-12-28 DIAGNOSIS — D500 Iron deficiency anemia secondary to blood loss (chronic): Secondary | ICD-10-CM

## 2012-12-28 DIAGNOSIS — M1710 Unilateral primary osteoarthritis, unspecified knee: Secondary | ICD-10-CM

## 2012-12-28 DIAGNOSIS — Z96659 Presence of unspecified artificial knee joint: Secondary | ICD-10-CM

## 2012-12-28 DIAGNOSIS — L259 Unspecified contact dermatitis, unspecified cause: Secondary | ICD-10-CM

## 2012-12-28 DIAGNOSIS — IMO0002 Reserved for concepts with insufficient information to code with codable children: Secondary | ICD-10-CM

## 2012-12-28 DIAGNOSIS — Z8719 Personal history of other diseases of the digestive system: Secondary | ICD-10-CM

## 2012-12-28 DIAGNOSIS — L309 Dermatitis, unspecified: Secondary | ICD-10-CM

## 2012-12-28 DIAGNOSIS — J45909 Unspecified asthma, uncomplicated: Secondary | ICD-10-CM

## 2012-12-28 NOTE — Progress Notes (Signed)
Patient ID: [name], female   DOB: 1960-11-28, 52 y.o.   MRN: [id number] Nursing Home Location:  [facility]   Place of Service: SNF (31)  PCP: Oliver Barre, MD  Allergies  Allergen Reactions  . Morphine Itching  . Shrimp [Shellfish Allergy] Itching  . Latex Rash    Chief Complaint  Patient presents with  . Discharge Note    HPI:  Patient is 52 y.o. female who is at [facility] for OT/PT after L total knee. Pt lives with husband; previously had R total knee; Patient currently doing well with therapy, now stable to discharge home with home health.  Review of Systems:  Review of Systems  Constitutional: Negative for fever, chills and malaise/fatigue.  Respiratory: Negative for cough.   Cardiovascular: Negative for chest pain and palpitations.  Gastrointestinal: Negative for heartburn, abdominal pain, diarrhea and constipation.  Genitourinary: Negative for dysuria and frequency.  Musculoskeletal: Positive for joint pain.  Skin: Negative.   Neurological: Negative for dizziness and headaches.     Past Medical History  Diagnosis Date  . Hypertension   . Asthma   . Obesity (BMI 30-39.9)   . Alcohol abuse     abuse- moderate years ago - states only drinks now on weekends -2 to 8 driinks   . DJD (degenerative joint disease)     right knee, mot to severe  . Anxiety   . Panic   . COLONIC POLYPS, HX OF 04/05/2010  . DIVERTICULITIS, HX OF 04/05/2010  . PALPITATIONS, HX OF 09/14/2007  . Heart murmur     hx of   . Depression 02/10/2011    20 yrs ago - pt states no longer a problem  . GERD (gastroesophageal reflux disease)     no meds  . Hyperlipidemia    Past Surgical History  Procedure Laterality Date  . Vesicovaginal fistula closure w/ tah    . Abdominal hysterectomy    . Knee arthroscopy      left   . Total knee arthroplasty  07/29/2011    Procedure: TOTAL KNEE ARTHROPLASTY;  Surgeon: Shelda Pal, MD;  Location: [facility];  Service: Orthopedics;   Laterality: Right;  . Total knee arthroplasty Left 12/14/2012    Procedure: LEFT TOTAL KNEE ARTHROPLASTY;  Surgeon: Shelda Pal, MD;  Location: [facility];  Service: Orthopedics;  Laterality: Left;   Social History:   reports that she quit smoking about 24 years ago. Her smoking use included Cigarettes. She has a 3.5 pack-year smoking history. She has never used smokeless tobacco. She reports that she drinks alcohol. She reports that she does not use illicit drugs.  Family History  Problem Relation Age of Onset  . Lymphoma Sister   . Stroke Mother   . COPD Father     Medications: Patient's Medications  New Prescriptions   No medications on file  Previous Medications   ALBUTEROL (PROVENTIL HFA;VENTOLIN HFA) 108 (90 BASE) MCG/ACT INHALER    Inhale 2 puffs into the lungs every 6 (six) hours as needed for wheezing.   ASPIRIN EC 325 MG EC TABLET    Take 1 tablet (325 mg total) by mouth 2 (two) times daily.   DOCUSATE SODIUM 100 MG CAPS    Take 100 mg by mouth 2 (two) times daily.   FERROUS SULFATE 325 (65 FE) MG TABLET    Take 1 tablet (325 mg total) by mouth 3 (three) times daily after meals.   FLUOCINONIDE CREAM (LIDEX) 0.05 %  Apply 1 application topically 2 (two) times daily as needed (rash).   FLUTICASONE-SALMETEROL (ADVAIR) 250-50 MCG/DOSE AEPB    Inhale 1 puff into the lungs every morning.    HYDROCODONE-ACETAMINOPHEN (NORCO) 7.5-325 MG PER TABLET    Take one tablet by mouth every four hours as needed for mild pain 1-5; Take two tablets by mouth every four hours as needed for severe pain   LOSARTAN-HYDROCHLOROTHIAZIDE (HYZAAR) 100-25 MG PER TABLET    Take 1 tablet by mouth every morning.   METHOCARBAMOL (ROBAXIN) 500 MG TABLET    Take 1 tablet (500 mg total) by mouth every 6 (six) hours as needed for muscle spasms.   POLYETHYLENE GLYCOL (MIRALAX / GLYCOLAX) PACKET    Take 17 g by mouth 2 (two) times daily.   TETRAHYDROZOLINE 0.05 % OPHTHALMIC SOLUTION    Place 1 drop into both eyes 2  (two) times daily as needed ((visine)  for eye redness).   Modified Medications   No medications on file  Discontinued Medications   CEPHALEXIN (KEFLEX) 500 MG CAPSULE    Take 1 capsule (500 mg total) by mouth 3 (three) times daily.     Physical Exam:  Filed Vitals:   12/28/12 1550  BP: 119/80  Pulse: 92  Temp: 97.8 F (36.6 C)  Resp: 20   GENERAL APPEARANCE: Alert, conversant. Appropriately groomed. No acute distress.  SKIN: No diaphoresis rash, or wounds  HEENT:unremarkable RESPIRATORY: Breathing is even, unlabored. Lung sounds are clear  CARDIOVASCULAR: Heart RRR no murmurs, rubs or gallops. No peripheral edema GASTROINTESTINAL: Abdomen is soft, non-tender, not distended w/ normal bowel sounds  GENITOURINARY: Bladder non tender, not distended  MUSCULOSKELETAL: DRESSING L KNEE; CDI  NEUROLOGIC: Oriented X3. Cranial nerves 2-12 grossly intact. Moves all extremities no tremor.  PSYCHIATRIC: Mood and affect appropriate to situation, no behavioral issues    Labs reviewed: Basic Metabolic Panel:  Recent Labs  16/10/96 1717 12/15/12 0505 12/16/12 0427  NA 142 139 139  K 3.7 3.2* 3.8  CL 104 105 103  CO2 29 26 29   GLUCOSE 93 163* 102*  BUN 22 19 18   CREATININE 0.9 0.69 0.72  CALCIUM 9.4 9.2 9.5   Liver Function Tests:  Recent Labs  02/23/12 0811 11/26/12 1717  AST 20 22  ALT 16 19  ALKPHOS 81 94  BILITOT 0.5 0.4  PROT 7.1 7.6  ALBUMIN 3.3* 3.5   No results found for this basename: LIPASE, AMYLASE,  in the last 8760 hours No results found for this basename: AMMONIA,  in the last 8760 hours CBC:  Recent Labs  02/23/12 0811 11/26/12 1717 12/15/12 0505 12/16/12 0427  WBC 9.7 11.2* 14.1* 12.3*  NEUTROABS 6.5 7.3  --   --   HGB 14.3 13.9 12.1 11.7*  HCT 44.0 41.8 35.7* 35.7*  MCV 89.1 87.8 89.5 89.7  PLT 248.0 271.0 180 182    Lipid Panel:  Recent Labs  02/23/12 0811  CHOL 218*  HDL 85.20  TRIG 60.0  CHOLHDL 3  LDLDIRECT 107.0   CBC  with Diff    Result: 12/18/2012 5:24 PM   ( Status: F )       WBC 14.5   H 4.0-10.5 K/uL SLN   RBC 4.37     3.87-5.11 MIL/uL SLN   Hemoglobin 12.7     12.0-15.0 g/dL SLN   Hematocrit 04.5     36.0-46.0 % SLN   MCV 86.7     78.0-100.0 fL SLN   MCH 29.1  26.0-34.0 pg SLN   MCHC 33.5     30.0-36.0 g/dL SLN   RDW 16.1     09.6-04.5 % SLN   Platelet Count 246     150-400 K/uL SLN   Granulocyte % 77     43-77 % SLN   Absolute Gran 11.1   H 1.7-7.7 K/uL SLN   Lymph % 15     12-46 % SLN   Absolute Lymph 2.2     0.7-4.0 K/uL SLN   Mono % 7     3-12 % SLN   Absolute Mono 1.0     0.1-1.0 K/uL SLN   Eos % 1     0-5 % SLN   Absolute Eos 0.2     0.0-0.7 K/uL SLN   Baso % 0     0-1 % SLN   Absolute Baso 0.0     0.0-0.1 K/uL SLN   Smear Review Criteria for review not met  SLN   Basic Metabolic Panel    Result: 12/18/2012 3:39 PM   ( Status: F )       Sodium 138     135-145 mEq/L SLN   Potassium 4.0     3.5-5.3 mEq/L SLN   Chloride 101     96-112 mEq/L SLN   CO2 28     19-32 mEq/L SLN   Glucose 98     70-99 mg/dL SLN   BUN 15     4-09 mg/dL SLN   Creatinine 8.11     0.50-1.10 mg/dL SLN   Calcium 9.4     9.1-47.8 mg/dL SLN   Lipid Profile    Result: 12/18/2012 3:39 PM   ( Status: F )       Cholesterol 194     0-200 mg/dL SLN C Triglyceride 46     <150 mg/dL SLN   HDL Cholesterol 77     >39 mg/dL SLN   Total Chol/HDL Ratio 2.5      Ratio SLN   VLDL Cholesterol (Calc) 9     0-40 mg/dL SLN   LDL Cholesterol (Calc) 108   H 0-99 mg/dL SLN C Liver Profile    Result: 12/18/2012 3:39 PM   ( Status: F )       Bilirubin, Total 0.5     0.3-1.2 mg/dL SLN   Bilirubin, Direct 0.1     0.0-0.3 mg/dL SLN   Indirect Bilirubin 0.4     0.0-0.9 mg/dL SLN   Alkaline Phosphatase 77     39-117 U/L SLN   AST/SGOT 17     0-37 U/L SLN   ALT/SGPT 15     0-35 U/L SLN   Total Protein 6.4     6.0-8.3 g/dL SLN   Albumin 3.2   L 2.9-5.6 g/dL SLN Assessment/Plan 1. HYPERTENSION Patient is stable; continue current  regimen. Will monitor and make changes as necessary.  2. Moderate persistent asthma with atopic features Stable on current medications; reports she has refills on both advair and albuterol from her pulmonalist   3. DIVERTICULITIS, HX OF Was placed on keflex from hospital; no current symptoms of diverticulitis at this time  4. Eczema Stable on current medications  5. OA Knee pain improved with recent surgery   6. Anemia Improved most recent hgb WNL; to cont iron  6. S/P knee replacement, left Pain controlled with current medications; will give Rx for norco as order in facility and robaxin as needed; to follow up with ortho.  pt is stable for discharge-will need PT/OT per home health. No DME needed. Rx written.  will need to follow up with PCP within 2 weeks.

## 2013-01-12 ENCOUNTER — Ambulatory Visit: Payer: PRIVATE HEALTH INSURANCE | Admitting: Internal Medicine

## 2013-02-12 ENCOUNTER — Other Ambulatory Visit: Payer: Self-pay | Admitting: Critical Care Medicine

## 2013-02-14 ENCOUNTER — Telehealth: Payer: Self-pay | Admitting: Critical Care Medicine

## 2013-02-14 NOTE — Telephone Encounter (Signed)
lmomtcb  

## 2013-02-15 MED ORDER — ALBUTEROL SULFATE HFA 108 (90 BASE) MCG/ACT IN AERS: 2.0000 | INHALATION_SPRAY | Freq: Four times a day (QID) | RESPIRATORY_TRACT | Status: DC | PRN

## 2013-02-15 NOTE — Telephone Encounter (Signed)
Spoke with pt. Aware RX has been sent. Nothing further needed 

## 2013-02-25 ENCOUNTER — Other Ambulatory Visit: Payer: Self-pay | Admitting: Internal Medicine

## 2013-03-08 ENCOUNTER — Other Ambulatory Visit: Payer: Self-pay | Admitting: Critical Care Medicine

## 2013-03-10 ENCOUNTER — Other Ambulatory Visit: Payer: Self-pay

## 2013-03-10 DIAGNOSIS — Z1231 Encounter for screening mammogram for malignant neoplasm of breast: Secondary | ICD-10-CM

## 2013-03-17 ENCOUNTER — Ambulatory Visit: Payer: PRIVATE HEALTH INSURANCE

## 2013-03-28 ENCOUNTER — Ambulatory Visit
Admission: RE | Admit: 2013-03-28 | Discharge: 2013-03-28 | Disposition: A | Discharge status: Home / Self Care | Payer: PRIVATE HEALTH INSURANCE | Source: Ambulatory Visit

## 2013-03-28 DIAGNOSIS — Z1231 Encounter for screening mammogram for malignant neoplasm of breast: Secondary | ICD-10-CM

## 2013-04-11 ENCOUNTER — Telehealth: Payer: Self-pay | Admitting: Critical Care Medicine

## 2013-04-11 MED ORDER — PREDNISONE 10 MG PO TABS: ORAL_TABLET | ORAL | Status: DC

## 2013-04-11 MED ORDER — AZITHROMYCIN 250 MG PO TABS: 250.0000 mg | ORAL_TABLET | ORAL | Status: DC

## 2013-04-11 NOTE — Telephone Encounter (Signed)
Called, spoke with pt.  C/o wheezing, coughing but "not much" with brown mucus this morning, "a little" increased SOB, and chest tightness x 1 1/2 wks.  States symptoms are "mild" but are normally not present.  No f/c/s.  Has used albuterol hfa with some relief.  Had 7-8 prednisone 10 mg tabs.  Has been taking 1 tab x 3 days.  Offered OV today or this week -- pt declined d/t her work schedule.  She is requesting rx to be called in.  As PW is off, will send msg to doc of the day.  RA, pls advise.  Thank you.  Last OV with PW: 11/26/12  [facility]  Allergies verified with pt: Allergies  Allergen Reactions  . Morphine Itching  . Shrimp [Shellfish Allergy] Itching  . Latex Rash

## 2013-04-11 NOTE — Telephone Encounter (Signed)
zpak Prednisone 10 mg tabs  Take 2 tabs daily with food x 5ds, then 1 tab daily with food x 5ds then STOP Needs OV when able - not seen x 34mnths

## 2013-04-11 NOTE — Telephone Encounter (Signed)
Spoke with the pt and notified of recs per RA  She verbalized understanding  Rxs were sent to pharm  She will call back to schedule appt

## 2013-06-15 ENCOUNTER — Other Ambulatory Visit: Payer: Self-pay | Admitting: Adult Health

## 2013-06-29 ENCOUNTER — Other Ambulatory Visit: Payer: Self-pay | Admitting: Critical Care Medicine

## 2013-07-01 ENCOUNTER — Telehealth: Payer: Self-pay | Admitting: Critical Care Medicine

## 2013-07-01 MED ORDER — MOMETASONE FURO-FORMOTEROL FUM 100-5 MCG/ACT IN AERO: 2.0000 | INHALATION_SPRAY | Freq: Two times a day (BID) | RESPIRATORY_TRACT | Status: DC

## 2013-07-01 NOTE — Telephone Encounter (Signed)
Try dulera??

## 2013-07-01 NOTE — Telephone Encounter (Signed)
Error no mesg.[name]

## 2013-07-01 NOTE — Telephone Encounter (Signed)
100

## 2013-07-01 NOTE — Telephone Encounter (Signed)
Symbicort is not on pt's current med list. Looks like it was taken off at her last OV with PW on 11/26/12. lmomtcb for pt on home and cell #s -- did she mean to request this rx?  Is she taking it?

## 2013-07-01 NOTE — Telephone Encounter (Signed)
Ok, please advise strength thanks!

## 2013-07-01 NOTE — Telephone Encounter (Signed)
I spoke with pt regarding below.  She did not agree about proair having refills and the advair not needing PA.  [facility], spoke with Rosanna.  Was advised pt has 6 refills of Proair left; no rx needed.   The Advair does need a PA -- covered alternatives are dulera and symbicort.  Called, spoke with pt again.  Informed her of above per Rosanna.  She verbalized understanding.  Pt states she has tried symbicort in the past.  States she coughed a lot with this medication.  She does not recall trying dulera.  Dr. Joya Gaskins, pls advise if you would like to change advair or if we should proceed with PA.  Thank you.

## 2013-07-01 NOTE — Telephone Encounter (Signed)
Pt returned call.  Pt would also like to know if we have any coupons for advair.  [name]

## 2013-07-01 NOTE — Telephone Encounter (Signed)
Spoke with the pt  She states that she is needing PA for advair and refill on proair  I have scheduled her appt since she is overdue  I have called the pharmacy and was advised that she just had her proair filled on 06/15/13 and she had advair filled 04/28/13 and has 5 more refills  I advised to have the advair refilled now  [facility] for the pt to see why she needs proair so soon and also no PA needed for advair, she can pick up rf today

## 2013-07-01 NOTE — Telephone Encounter (Signed)
Rx for [facility] was sent  Pt aware  MAR was updated

## 2013-07-01 NOTE — Telephone Encounter (Signed)
See phone msg from 07/01/13 -- dulera was rx'd.  Will decline the symbicort rx request.

## 2013-07-01 NOTE — Telephone Encounter (Deleted)
Pt advised and will pick up refill. [place] Bing, CMA

## 2013-07-02 ENCOUNTER — Other Ambulatory Visit: Payer: Self-pay | Admitting: Internal Medicine

## 2013-07-29 ENCOUNTER — Ambulatory Visit: Payer: PRIVATE HEALTH INSURANCE | Admitting: Critical Care Medicine

## 2013-12-06 ENCOUNTER — Ambulatory Visit (INDEPENDENT_AMBULATORY_CARE_PROVIDER_SITE_OTHER): Payer: PRIVATE HEALTH INSURANCE | Admitting: Internal Medicine

## 2013-12-06 ENCOUNTER — Encounter: Payer: Self-pay | Admitting: Internal Medicine

## 2013-12-06 ENCOUNTER — Other Ambulatory Visit (INDEPENDENT_AMBULATORY_CARE_PROVIDER_SITE_OTHER): Payer: PRIVATE HEALTH INSURANCE

## 2013-12-06 ENCOUNTER — Other Ambulatory Visit: Payer: Self-pay | Admitting: Internal Medicine

## 2013-12-06 VITALS — BP 120/82 | HR 103 | Temp 98.4°F | Ht 61.5 in | Wt 277.0 lb

## 2013-12-06 DIAGNOSIS — R7302 Impaired glucose tolerance (oral): Secondary | ICD-10-CM

## 2013-12-06 DIAGNOSIS — J309 Allergic rhinitis, unspecified: Secondary | ICD-10-CM | POA: Insufficient documentation

## 2013-12-06 DIAGNOSIS — Z0000 Encounter for general adult medical examination without abnormal findings: Secondary | ICD-10-CM

## 2013-12-06 DIAGNOSIS — J3089 Other allergic rhinitis: Secondary | ICD-10-CM

## 2013-12-06 DIAGNOSIS — D1720 Benign lipomatous neoplasm of skin and subcutaneous tissue of unspecified limb: Secondary | ICD-10-CM

## 2013-12-06 HISTORY — DX: Allergic rhinitis, unspecified: J30.9

## 2013-12-06 HISTORY — DX: Impaired glucose tolerance (oral): R73.02

## 2013-12-06 LAB — URINALYSIS, ROUTINE W REFLEX MICROSCOPIC
Bilirubin Urine: NEGATIVE
Hgb urine dipstick: NEGATIVE
Ketones, ur: NEGATIVE
Leukocytes, UA: NEGATIVE
Nitrite: NEGATIVE
RBC / HPF: NONE SEEN (ref 0–?)
Specific Gravity, Urine: 1.025 (ref 1.000–1.030)
Total Protein, Urine: NEGATIVE
Urine Glucose: NEGATIVE
Urobilinogen, UA: 0.2 (ref 0.0–1.0)
pH: 5.5 (ref 5.0–8.0)

## 2013-12-06 LAB — CBC WITH DIFFERENTIAL/PLATELET
Basophils Absolute: 0 10*3/uL (ref 0.0–0.1)
Basophils Relative: 0.3 % (ref 0.0–3.0)
Eosinophils Absolute: 0.2 10*3/uL (ref 0.0–0.7)
Eosinophils Relative: 1.9 % (ref 0.0–5.0)
HCT: 45.3 % (ref 36.0–46.0)
Hemoglobin: 14.7 g/dL (ref 12.0–15.0)
Lymphocytes Relative: 19.8 % (ref 12.0–46.0)
Lymphs Abs: 2.1 10*3/uL (ref 0.7–4.0)
MCHC: 32.5 g/dL (ref 30.0–36.0)
MCV: 91.2 fl (ref 78.0–100.0)
Monocytes Absolute: 0.9 10*3/uL (ref 0.1–1.0)
Monocytes Relative: 8.3 % (ref 3.0–12.0)
Neutro Abs: 7.3 10*3/uL (ref 1.4–7.7)
Neutrophils Relative %: 69.7 % (ref 43.0–77.0)
Platelets: 239 10*3/uL (ref 150.0–400.0)
RBC: 4.97 Mil/uL (ref 3.87–5.11)
RDW: 14.4 % (ref 11.5–15.5)
WBC: 10.5 10*3/uL (ref 4.0–10.5)

## 2013-12-06 LAB — HEPATIC FUNCTION PANEL
ALT: 25 U/L (ref 0–35)
AST: 30 U/L (ref 0–37)
Albumin: 3.4 g/dL — ABNORMAL LOW (ref 3.5–5.2)
Alkaline Phosphatase: 96 U/L (ref 39–117)
Bilirubin, Direct: 0.1 mg/dL (ref 0.0–0.3)
Total Bilirubin: 0.8 mg/dL (ref 0.2–1.2)
Total Protein: 8.1 g/dL (ref 6.0–8.3)

## 2013-12-06 LAB — TSH: TSH: 1.29 u[IU]/mL (ref 0.35–4.50)

## 2013-12-06 LAB — BASIC METABOLIC PANEL
BUN: 21 mg/dL (ref 6–23)
CO2: 26 mEq/L (ref 19–32)
Calcium: 9.8 mg/dL (ref 8.4–10.5)
Chloride: 104 mEq/L (ref 96–112)
Creatinine, Ser: 1 mg/dL (ref 0.4–1.2)
GFR: 76.44 mL/min (ref >60.00)
Glucose, Bld: 97 mg/dL (ref 70–99)
Potassium: 3.8 mEq/L (ref 3.5–5.1)
Sodium: 140 mEq/L (ref 135–145)

## 2013-12-06 LAB — HEMOGLOBIN A1C: Hgb A1c MFr Bld: 5.6 % (ref 4.6–6.5)

## 2013-12-06 LAB — LIPID PANEL
Cholesterol: 247 mg/dL — ABNORMAL HIGH (ref 0–200)
HDL: 80.8 mg/dL (ref >39.00)
LDL Cholesterol: 153 mg/dL — ABNORMAL HIGH (ref 0–99)
NonHDL: 166.2
Total CHOL/HDL Ratio: 3
Triglycerides: 66 mg/dL (ref 0.0–149.0)
VLDL: 13.2 mg/dL (ref 0.0–40.0)

## 2013-12-06 MED ORDER — ATORVASTATIN CALCIUM 10 MG PO TABS: 10.0000 mg | ORAL_TABLET | Freq: Every day | ORAL | Status: DC

## 2013-12-06 MED ORDER — LOSARTAN POTASSIUM-HCTZ 100-25 MG PO TABS: 1.0000 | ORAL_TABLET | Freq: Every morning | ORAL | Status: DC

## 2013-12-06 MED ORDER — FEXOFENADINE HCL 180 MG PO TABS: 180.0000 mg | ORAL_TABLET | Freq: Every day | ORAL | Status: DC

## 2013-12-06 NOTE — Assessment & Plan Note (Signed)
For allegra prn, also nasaocrt OTC prn

## 2013-12-06 NOTE — Patient Instructions (Signed)
Please take all new medication as prescribed - the allegra  Please also try OTC Nasacort if the allegra is not effective for all the drainage at night  Please continue all other medications as before, and refills have been done if requested.  Please have the pharmacy call with any other refills you may need.  Please continue your efforts at being more active, low cholesterol diet, and weight control.  You are otherwise up to date with prevention measures today.  You will be contacted regarding the referral for: General Surgury  Please keep your appointments with your specialists as you may have planned - Dr Joya Gaskins for Asthma  Please go to the LAB in the Basement (turn left off the elevator) for the tests to be done today  You will be contacted by phone if any changes need to be made immediately.  Otherwise, you will receive a letter about your results with an explanation, but please check with MyChart first.  Please remember to sign up for MyChart if you have not done so, as this will be important to you in the future with finding out test results, communicating by private email, and scheduling acute appointments online when needed.  Please return in 6 months, or sooner if needed

## 2013-12-06 NOTE — Progress Notes (Signed)
Pre visit review using our clinic review tool, if applicable. No additional management support is needed unless otherwise documented below in the visit note. 

## 2013-12-06 NOTE — Progress Notes (Signed)
Subjective:    Patient ID: [name], female    DOB: 11/07/1960, 53 y.o.   MRN: [id number]  HPI  Here for wellness and f/u;  Overall doing ok;  Pt denies CP, worsening SOB, DOE, wheezing, orthopnea, PND, worsening LE edema, palpitations, dizziness or syncope.  Pt denies neurological change such as new headache, facial or extremity weakness.  Pt denies polydipsia, polyuria, or low sugar symptoms. Pt states overall good compliance with treatment and medications, good tolerability, and has been trying to follow lower cholesterol diet.  Pt denies worsening depressive symptoms, suicidal ideation or panic. No fever, night sweats, wt loss, loss of appetite, or other constitutional symptoms.  Pt states good ability with ADL's, has low fall risk, home safety reviewed and adequate, no other significant changes in hearing or vision, and only occasionally active with exercise, plans to do more at the gym, gained 20 lbs in the past yr, but is doing better with knees, wlaks up to 4-5 miles per day according to [employer].   Now with constipation as well, not improved so much with miralax but is expensive.Tried dulcolox as well.  Right thigh lipoma incrased in size.  Also Does have several wks ongoing nasal allergy symptoms with clearish congestion, itch and sneezing, without fever, pain, ST, cough, swelling or wheezing, Wt Readings from Last 3 Encounters:  12/06/13 277 lb (125.646 kg)  12/14/12 255 lb 12.8 oz (116.03 kg)  12/14/12 255 lb 12.8 oz (116.03 kg)  Does c/o ongoing fatigue, but denies signficant daytime hypersomnolence. Does have sinus HA with colder air at work, has nasal and eye congestion, work is dusty, no fever. Past Medical History  Diagnosis Date  . Hypertension   . Asthma   . Obesity (BMI 30-39.9)   . Alcohol abuse     abuse- moderate years ago - states only drinks now on weekends -2 to 8 driinks   . DJD (degenerative joint disease)     right knee, mot to severe  . Anxiety   . Panic   .  COLONIC POLYPS, HX OF 04/05/2010  . DIVERTICULITIS, HX OF 04/05/2010  . PALPITATIONS, HX OF 09/14/2007  . Heart murmur     hx of   . Depression 02/10/2011    20 yrs ago - pt states no longer a problem  . GERD (gastroesophageal reflux disease)     no meds  . Hyperlipidemia    Past Surgical History  Procedure Laterality Date  . Vesicovaginal fistula closure w/ tah    . Abdominal hysterectomy    . Knee arthroscopy      left   . Total knee arthroplasty  07/29/2011    Procedure: TOTAL KNEE ARTHROPLASTY;  Surgeon: Mauri Pole, MD;  Location: [facility];  Service: Orthopedics;  Laterality: Right;  . Total knee arthroplasty Left 12/14/2012    Procedure: LEFT TOTAL KNEE ARTHROPLASTY;  Surgeon: Mauri Pole, MD;  Location: [facility];  Service: Orthopedics;  Laterality: Left;    reports that she quit smoking about 25 years ago. Her smoking use included Cigarettes. She has a 3.5 pack-year smoking history. She has never used smokeless tobacco. She reports that she drinks alcohol. She reports that she does not use illicit drugs. family history includes COPD in her father; Lymphoma in her sister; Stroke in her mother. Allergies  Allergen Reactions  . Morphine Itching  . Shrimp [Shellfish Allergy] Itching  . Latex Rash   Current Outpatient Prescriptions on File Prior to Visit  Medication  Sig Dispense Refill  . HYDROcodone-acetaminophen (NORCO) 7.5-325 MG per tablet Take one tablet by mouth every four hours as needed for mild pain 1-5; Take two tablets by mouth every four hours as needed for severe pain  360 tablet  0  . methocarbamol (ROBAXIN) 500 MG tablet Take 1 tablet (500 mg total) by mouth every 6 (six) hours as needed for muscle spasms.  50 tablet  0  . PROAIR HFA 108 (90 BASE) MCG/ACT inhaler INHALE 2 PUFFS INTO THE LUNGS EVERY 6 HOURS AS NEEDED FOR WHEEZING  8.5 each  3   No current facility-administered medications on file prior to visit.   Review of Systems Constitutional: Negative for  increased diaphoresis, other activity, appetite or other siginficant weight change  HENT: Negative for worsening hearing loss, ear pain, facial swelling, mouth sores and neck stiffness.   Eyes: Negative for other worsening pain, redness or visual disturbance.  Respiratory: Negative for shortness of breath and wheezing.   Cardiovascular: Negative for chest pain and palpitations.  Gastrointestinal: Negative for diarrhea, blood in stool, abdominal distention or other pain Genitourinary: Negative for hematuria, flank pain or change in urine volume.  Musculoskeletal: Negative for myalgias or other joint complaints.  Skin: Negative for color change and wound.  Neurological: Negative for syncope and numbness. other than noted Hematological: Negative for adenopathy. or other swelling Psychiatric/Behavioral: Negative for hallucinations, self-injury, decreased concentration or other worsening agitation.      Objective:   Physical Exam BP 120/82  Pulse 103  Temp(Src) 98.4 F (36.9 C) (Oral)  Ht 5' 1.5" (1.562 m)  Wt 277 lb (125.646 kg)  BMI 51.50 kg/m2  SpO2 96% VS noted,  Constitutional: Pt is oriented to person, place, and time. Appears well-developed and well-nourished.  Head: Normocephalic and atraumatic.  Right Ear: External ear normal.  Left Ear: External ear normal.  Nose: Nose normal.  Mouth/Throat: Oropharynx is clear and moist.  Eyes: Conjunctivae and EOM are normal. Pupils are equal, round, and reactive to light.  Neck: Normal range of motion. Neck supple. No JVD present. No tracheal deviation present.  Cardiovascular: Normal rate, regular rhythm, normal heart sounds and intact distal pulses.   Pulmonary/Chest: Effort normal and breath sounds without rales or wheezing  Abdominal: Soft. Bowel sounds are normal. NT. No HSM  Musculoskeletal: Normal range of motion. Exhibits no edema.  Lymphadenopathy:  Has no cervical adenopathy.  Neurological: Pt is alert and oriented to person,  place, and time. Pt has normal reflexes. No cranial nerve deficit. Motor grossly intact Skin: Skin is warm and dry. No rash noted. has right ant thigh lipoma Psychiatric:  Has normal mood and affect. Behavior is normal.     Assessment & Plan:

## 2013-12-06 NOTE — Assessment & Plan Note (Addendum)

## 2013-12-15 ENCOUNTER — Telehealth: Payer: Self-pay | Admitting: *Deleted

## 2013-12-15 MED ORDER — HYDROCODONE-HOMATROPINE 5-1.5 MG/5ML PO SYRP: 5.0000 mL | ORAL_SOLUTION | Freq: Four times a day (QID) | ORAL | Status: DC | PRN

## 2013-12-15 NOTE — Telephone Encounter (Signed)
Pt called to check up on this request. Please call her at home now [phone] if this is ok

## 2013-12-15 NOTE — Telephone Encounter (Signed)
Left msg on triage stating her cold sxs is not getting any better,. She is coughing more at night, coughing up little yellowish phlegm. Pt is wanting md to rx cough syrup...[name]

## 2013-12-15 NOTE — Telephone Encounter (Signed)
Done hardcopy to robin  

## 2013-12-16 NOTE — Telephone Encounter (Signed)
Pt contacted regarding rx being ready for pick up.

## 2013-12-19 ENCOUNTER — Telehealth: Payer: Self-pay | Admitting: *Deleted

## 2013-12-19 NOTE — Telephone Encounter (Signed)
[facility] Triage Call Report Triage Record Num: [id number] Operator: Feliberto Harts Patient Name: [name] Call Date & Time: 12/15/2013 6:34:35PM Patient Phone: [phone] PCP: Cathlean Cower Patient Gender: Female PCP Fax : [phone] Patient DOB: 03-May-1960 Practice Name: [facility] Reason for Call: Caller: [name]/Patient; PCP: Cathlean Cower (Adults only); CB#: [phone]; Call regarding Cough/Congestion; Seen at office 12/12/2013 due to cough and congestion. Onset of symptoms 12/09/2013 with nasal congestion. Placed on Allegra and that helped open her head up. Now has moved into her chest. Afebrile. Used Albuterol inhaler at 14:00. Has cough but no trouble breathing. Coughing up yellow sputum.Triaged per Cough guideline. Disposition See Provider within 24 hrs per New or worsening cough and resp. condition. Care advice given. Told to continue Allegra until cough better. Recommended OTC Delsym or Musinex per package instructions. To F/U with office 12/16/2013. Protocol(s) Used: Cough - Adult Recommended Outcome per Protocol: See Provider within 24 hours Reason for Outcome: New or worsening cough AND known cardiac or respiratory condition Care Advice: ~ Continue all prescription medication as recommended until evaluated by provider. ~ Call provider if symptoms worsen or new symptoms develop. Go to ED IMMEDIATELY if developing increased shortness of breath, continuous cough, worsening fatigue, or unable to perform ADLs. ~ 11/

## 2013-12-21 ENCOUNTER — Telehealth: Payer: Self-pay | Admitting: Internal Medicine

## 2013-12-21 NOTE — Telephone Encounter (Signed)
Pt requesting call regarding cholesterol reading and Lipitor.Pt want to speak with Dr John/s assistant only.  Pt at work until [place], [phone]

## 2013-12-21 NOTE — Telephone Encounter (Signed)
Called the patient back and she just needed confirmation that by taking the lipitor for cholesterol that it would improve along with better diet and exercise.  The patient has been a little anxious regarding her last results and cholesterol increase and states she has been having some heart palpitations.  Informed she should schedule appt. With PCP to evaluate.  She did agree to do so but was at work and would call back.

## 2013-12-21 NOTE — Telephone Encounter (Signed)
Patient informed of MD instructions and will do labs in 6 months at next appt.

## 2013-12-21 NOTE — Telephone Encounter (Signed)
OK to let pt know that the Lipitor does not cause palpitations, or make it worse  Also, ok to start now, as this can reduce her risk of heart dz and stroke  I think low cholesterol diet can work (excercise no for cholesterol for the most part), but most patients are unable to change the diet enough to get the LDL < 100.  Hers was 153.  We could try the diet, and schedule a repeat Lipid panel in 6 mo if she wants.  Remember, OTC fish oil, garlic and anything else will not work

## 2014-01-13 ENCOUNTER — Other Ambulatory Visit: Payer: Self-pay | Admitting: Critical Care Medicine

## 2014-01-13 NOTE — Telephone Encounter (Signed)
Received refill request for proair hfa. Last OV with PW 11/2012 Called, spoke with pt - pt states she is currently at work and would like to call back to schedule yearly follow up.   Advised 1 rx will be sent and asked pt to pls call back to schedule for further refills.  She verbalized understanding, is in agreement with plan, and voiced no further questions or concerns at this time.

## 2014-02-01 ENCOUNTER — Other Ambulatory Visit: Payer: Self-pay | Admitting: Critical Care Medicine

## 2014-02-01 ENCOUNTER — Telehealth: Payer: Self-pay | Admitting: Pulmonary Disease

## 2014-02-01 MED ORDER — ALBUTEROL SULFATE HFA 108 (90 BASE) MCG/ACT IN AERS: 2.0000 | INHALATION_SPRAY | Freq: Four times a day (QID) | RESPIRATORY_TRACT | Status: DC | PRN

## 2014-02-01 NOTE — Telephone Encounter (Signed)
Pt forgot her proair at home.  She is visiting family in [place] for holidays.  [facility] and was told that if order is placed for proair at [place], then script can be transferred to [place].  Order sent electronically for proair two puffs q6h prn.

## 2014-02-02 ENCOUNTER — Other Ambulatory Visit: Payer: Self-pay

## 2014-02-02 ENCOUNTER — Other Ambulatory Visit: Payer: Self-pay | Admitting: Critical Care Medicine

## 2014-02-02 MED ORDER — ALBUTEROL SULFATE HFA 108 (90 BASE) MCG/ACT IN AERS: 2.0000 | INHALATION_SPRAY | Freq: Four times a day (QID) | RESPIRATORY_TRACT | Status: DC | PRN

## 2014-02-02 NOTE — Telephone Encounter (Signed)
Called and spoke with pt and she stated that the pharmacy did not receive the rx.  i have resent this in for her and she is aware. Nothing further is needed.

## 2014-02-02 NOTE — Telephone Encounter (Signed)
Pt calling to check on status of prescript to make sure that it was sent.[name]

## 2014-02-02 NOTE — Telephone Encounter (Signed)
noted 

## 2014-02-02 NOTE — Telephone Encounter (Signed)
Inhaler sent in as requested by the patient per message.

## 2014-02-06 ENCOUNTER — Other Ambulatory Visit: Payer: Self-pay | Admitting: Critical Care Medicine

## 2014-02-07 ENCOUNTER — Telehealth: Payer: Self-pay | Admitting: Internal Medicine

## 2014-02-07 ENCOUNTER — Telehealth: Payer: Self-pay | Admitting: *Deleted

## 2014-02-07 NOTE — Telephone Encounter (Signed)
Pt called back in, told her that Dr Jenny Reichmann wanted her to setup an appt.  She stated that she would have to call back

## 2014-02-07 NOTE — Telephone Encounter (Signed)
Per Epic, albuterol hfa rx sent by Dr. Gwynn Burly office on 02/02/14 [facility] [address], spoke with Adonis Huguenin.  Was advised they did receive the above rx sent by Dr. Gwynn Burly office and medication was picked up on 02/06/14.  Per Adonis Huguenin, refills remain and nothing further is needed.

## 2014-02-07 NOTE — Telephone Encounter (Signed)
Left vm for patient to call back to make appointment

## 2014-02-07 NOTE — Telephone Encounter (Signed)
[facility] - Client [facility] Patient Name: Shevy Harvie Gender: Female DOB: June 08, 1960 Age: 53 Y 10 M 11 D Return Phone Number: [phone] (Primary) Address: [address] City/State/Zip: [place] [address] Client [facility] - Client Client Site [place] - Day Physician [place], [place] Type Call Call Type Triage / Clinical Relationship To Patient Self Return Phone Number [phone] (Primary) Chief Complaint Blood Pressure High Initial Comment Caller states her BP is 184/115 PreDisposition Did not know what to do Nurse Assessment Nurse: Kenton Kingfisher, RN, Meagan Date/Time (Eastern Time): 02/06/2014 9:53:23 AM Confirm and document reason for call. If symptomatic, describe symptoms. ---Caller states her BP is 184/115. Caller states it keeps going up and down. No other symptoms. Caller states head may be hurting a bit. Has the patient traveled out of the country within the last 30 days? ---Not Applicable Does the patient require triage? ---Yes Related visit to physician within the last 2 weeks? ---No Does the PT have any chronic conditions? (i.e. diabetes, asthma, etc.) ---Yes List chronic conditions. ---hypertension asthma Did the patient indicate they were pregnant? ---No Guidelines Guideline Title Affirmed Question Affirmed Notes Nurse Date/Time (Eastern Time) High Blood Pressure BP # 160/100 BP 172/110 and took medication this morning at 5 am. Kenton Kingfisher, RN, Meagan 02/06/2014 9:55:24 AM Disp. Time Eilene Ghazi Time) Disposition Final User 02/06/2014 10:00:55 AM See PCP When Office is Open (within 3 days) Yes Kenton Kingfisher, RN, Meagan Caller Understands: Yes Disagree/Comply: Comply PLEASE NOTE: All timestamps contained within this report are represented as [place] Standard Time. CONFIDENTIALTY NOTICE: This fax transmission is intended only for the addressee. It contains  information that is legally privileged, confidential or otherwise protected from use or disclosure. If you are not the intended recipient, you are strictly prohibited from reviewing, disclosing, copying using or disseminating any of this information or taking any action in reliance on or regarding this information. If you have received this fax in error, please notify us immediately by telephone so that we can arrange for its return to [place]. Phone: [phone], Toll-Free: [phone], Fax: [phone] Page: 2 of 2 Call Id: [id number] Care Advice Given Per Guideline SEE PCP WITHIN 3 DAYS: You need to be examined within 2 or 3 days. Call your doctor during regular office hours and make an appointment. (Note: if office will be open tomorrow, tell caller to call then, not in 3 days). HIGH BLOOD PRESSURE: LIFESTYLE MODIFICATIONS - The following things can help you reduce your blood pressure: * EAT HEALTHY: Eat a diet rich in fresh fruits and vegetables, dietary fiber, non-animal protein (e.g., soy), and low-fat dairy products. Avoid foods with a high content of saturated fat or cholesterol. * DECREASE SODIUM INTAKE: Aim to eat less than 1,500 mg of sodium each day. Unfortunately 75% of the salt in the average person's diet is in pre-processed foods. * LIMIT ALCOHOL: Limit alcohol to 0-2 standard drinks each day. Men should have less than 14 dinks per week. Women should have less than 9 drinks per week. A drink is 1.5 oz hard liquor (one shot or jigger; 45 ml), 5 oz wine (small glass; 150 ml), 12 oz beer (one can; 360 ml). * EXERCISE, BE MORE PHYSICALLY ACTIVE: Do 30-60 minutes of moderate intensity exercise four to seven times a week. Examples include aerobic activities like brisk walking, cycling, and swimming. * REDUCE WEIGHT AND WAIST LINE: It is important to maintain a normal body weight. The  goal should be a BMI (body mass index) under 25 for men and women, a waist circumference under 40 inches (102  cm) in men, and a waist circumference under 35 inches (88 cm) in women. * REDUCE STRESS: Find activities that help reduce your stress. Examples might include meditation, yoga, or even a restful walk in a park. CALL BACK IF: * Weakness or numbness of the face, arm or leg on one side of the body occurs * Difficulty walking, difficulty talking, or severe headache occurs * Chest pain or difficulty breathing occurs * You become worse. * Your blood pressure is over 180/110 CARE ADVICE given per High Blood Pressure (Adult) guideline. After Care Instructions Given Call Event Type User Date / Time Description Comments User: Dayton Martes, RN Date/Time Eilene Ghazi Time): 02/06/2014 10:02:00 AM [phone] Caller needing to make appt within the next 3 days.

## 2014-02-07 NOTE — Telephone Encounter (Signed)
bp keeps going up 175/110.  When she lays down it is good but once she gets up her bp goes up.  She would like to know if she needs to come in or if she needs to try doing something else.

## 2014-02-07 NOTE — Telephone Encounter (Signed)
Vilas for OV to follow up

## 2014-02-17 ENCOUNTER — Telehealth: Payer: Self-pay | Admitting: Internal Medicine

## 2014-02-17 ENCOUNTER — Ambulatory Visit: Payer: PRIVATE HEALTH INSURANCE | Admitting: Adult Health

## 2014-02-17 MED ORDER — ORLISTAT 60 MG PO CAPS: 60.0000 mg | ORAL_CAPSULE | Freq: Three times a day (TID) | ORAL | Status: DC

## 2014-02-17 NOTE — Telephone Encounter (Signed)
Pt came by office to request Rx for ALLI. Pt states that her insurance will not cover the medication unless she has Rx for this over the counter med. Please review this request and advise.

## 2014-02-17 NOTE — Telephone Encounter (Signed)
Called pt to verify msg she is needing rx for otc Alli so she can use her saving card with her job. [facility]form [facility].../[facility]

## 2014-02-20 ENCOUNTER — Telehealth: Payer: Self-pay | Admitting: Internal Medicine

## 2014-02-20 MED ORDER — ORLISTAT 60 MG PO CAPS: 60.0000 mg | ORAL_CAPSULE | Freq: Three times a day (TID) | ORAL | Status: DC

## 2014-02-20 NOTE — Telephone Encounter (Signed)
Pt called in and said that [facility] and [facility] said that [facility] did not [facility] her script that was sent over on Friday 02/17/2014?  She asked if we could resend it?

## 2014-02-20 NOTE — Telephone Encounter (Signed)
Called pt inform her rx was sent electronically on Friday [facility].../lmb

## 2014-02-21 ENCOUNTER — Telehealth: Payer: Self-pay | Admitting: Internal Medicine

## 2014-02-21 MED ORDER — ORLISTAT 60 MG PO CAPS: 60.0000 mg | ORAL_CAPSULE | Freq: Three times a day (TID) | ORAL | Status: DC

## 2014-02-21 NOTE — Telephone Encounter (Signed)
Called and informed patient that script is ready for pick up at front desk. JG//CMA

## 2014-02-21 NOTE — Telephone Encounter (Signed)
Have sent rx twice electronically & fax. Printed rx for pick-up...[name]

## 2014-02-21 NOTE — Telephone Encounter (Signed)
[facility] said [facility] still do not have the script for Alli  Will print it out and pt will come pick up paper copy.

## 2014-04-06 ENCOUNTER — Other Ambulatory Visit: Payer: Self-pay | Admitting: Critical Care Medicine

## 2014-04-10 ENCOUNTER — Telehealth: Payer: Self-pay | Admitting: Internal Medicine

## 2014-04-10 NOTE — Telephone Encounter (Signed)
Pt was wondering if Dr. Jenny Reichmann want her to have lab work prior to the appt on 04/14/14. Please advise, no order in system

## 2014-04-10 NOTE — Telephone Encounter (Signed)
Received refill request for Advair 250. Pt has not seen Dr. Joya Gaskins since 11/26/2012.   No pending appt. Called, spoke with pt.  She is currently at work and states she will have to call back to schedule appt. Pt aware rx sent x 1 but will need to schedule and keep appt with Dr. Joya Gaskins for refills. Pt verbalized understanding, is in agreement with this plan, and voiced no further questions or concerns at this time.

## 2014-04-11 ENCOUNTER — Telehealth: Payer: Self-pay

## 2014-04-11 DIAGNOSIS — R739 Hyperglycemia, unspecified: Secondary | ICD-10-CM

## 2014-04-11 DIAGNOSIS — E785 Hyperlipidemia, unspecified: Secondary | ICD-10-CM

## 2014-04-11 NOTE — Telephone Encounter (Signed)
Pt. Has an appt. On Friday for a follow up for cholesterol. She wants to know if she can come in a day early for labs and still come in on Friday.

## 2014-04-11 NOTE — Telephone Encounter (Signed)
Labs ordered.

## 2014-04-12 NOTE — Telephone Encounter (Signed)
Notified pt md place lab order...[name]

## 2014-04-13 ENCOUNTER — Other Ambulatory Visit (INDEPENDENT_AMBULATORY_CARE_PROVIDER_SITE_OTHER): Payer: PRIVATE HEALTH INSURANCE

## 2014-04-13 DIAGNOSIS — E785 Hyperlipidemia, unspecified: Secondary | ICD-10-CM

## 2014-04-13 DIAGNOSIS — R739 Hyperglycemia, unspecified: Secondary | ICD-10-CM

## 2014-04-13 LAB — BASIC METABOLIC PANEL
BUN: 20 mg/dL (ref 6–23)
CO2: 27 mEq/L (ref 19–32)
Calcium: 9.5 mg/dL (ref 8.4–10.5)
Chloride: 105 mEq/L (ref 96–112)
Creatinine, Ser: 0.9 mg/dL (ref 0.40–1.20)
GFR: 84.22 mL/min (ref >60.00)
Glucose, Bld: 100 mg/dL — ABNORMAL HIGH (ref 70–99)
Potassium: 4 mEq/L (ref 3.5–5.1)
Sodium: 140 mEq/L (ref 135–145)

## 2014-04-13 LAB — LIPID PANEL
Cholesterol: 175 mg/dL (ref 0–200)
HDL: 70.1 mg/dL (ref >39.00)
LDL Cholesterol: 94 mg/dL (ref 0–99)
NonHDL: 104.9
Total CHOL/HDL Ratio: 2
Triglycerides: 56 mg/dL (ref 0.0–149.0)
VLDL: 11.2 mg/dL (ref 0.0–40.0)

## 2014-04-13 LAB — HEPATIC FUNCTION PANEL
ALT: 18 U/L (ref 0–35)
AST: 22 U/L (ref 0–37)
Albumin: 3.7 g/dL (ref 3.5–5.2)
Alkaline Phosphatase: 94 U/L (ref 39–117)
Bilirubin, Direct: 0.1 mg/dL (ref 0.0–0.3)
Total Bilirubin: 0.4 mg/dL (ref 0.2–1.2)
Total Protein: 7.3 g/dL (ref 6.0–8.3)

## 2014-04-13 LAB — HEMOGLOBIN A1C: Hgb A1c MFr Bld: 5.7 % (ref 4.6–6.5)

## 2014-04-14 ENCOUNTER — Ambulatory Visit (INDEPENDENT_AMBULATORY_CARE_PROVIDER_SITE_OTHER): Payer: PRIVATE HEALTH INSURANCE | Admitting: Internal Medicine

## 2014-04-14 ENCOUNTER — Telehealth: Payer: Self-pay | Admitting: Internal Medicine

## 2014-04-14 ENCOUNTER — Encounter: Payer: Self-pay | Admitting: Internal Medicine

## 2014-04-14 VITALS — BP 138/88 | HR 90 | Temp 98.0°F | Resp 20 | Ht 62.0 in | Wt 276.1 lb

## 2014-04-14 DIAGNOSIS — R0789 Other chest pain: Secondary | ICD-10-CM

## 2014-04-14 DIAGNOSIS — R079 Chest pain, unspecified: Secondary | ICD-10-CM

## 2014-04-14 DIAGNOSIS — R7302 Impaired glucose tolerance (oral): Secondary | ICD-10-CM

## 2014-04-14 DIAGNOSIS — Z0189 Encounter for other specified special examinations: Secondary | ICD-10-CM

## 2014-04-14 DIAGNOSIS — Z0000 Encounter for general adult medical examination without abnormal findings: Secondary | ICD-10-CM

## 2014-04-14 DIAGNOSIS — I10 Essential (primary) hypertension: Secondary | ICD-10-CM

## 2014-04-14 MED ORDER — SENNOSIDES 25 MG PO TABS: ORAL_TABLET | ORAL | Status: DC

## 2014-04-14 NOTE — Assessment & Plan Note (Signed)
stable overall by history and exam, recent data reviewed with pt, and pt to continue medical treatment as before,  to f/u any worsening symptoms or concerns BP Readings from Last 3 Encounters:  04/14/14 138/88  12/06/13 120/82  12/28/12 119/80

## 2014-04-14 NOTE — Progress Notes (Signed)
Pre visit review using our clinic review tool, if applicable. No additional management support is needed unless otherwise documented below in the visit note. 

## 2014-04-14 NOTE — Patient Instructions (Addendum)
Please continue all other medications as before, and refills have been done if requested.  Please have the pharmacy call with any other refills you may need.  Please continue your efforts at being more active, low cholesterol diet, and weight control.  You are otherwise up to date with prevention measures today.  Please keep your appointments with your specialists as you may have planned  Your lab work and EKG was OK today  Please return in 6 months, or sooner if needed, with Lab testing done 3-5 days before  Wt Readings from Last 3 Encounters:  04/14/14 276 lb 1.3 oz (125.229 kg)  12/06/13 277 lb (125.646 kg)  11/26/12 257 lb (116.574 kg)

## 2014-04-14 NOTE — Assessment & Plan Note (Signed)
Doubt cardiac, ECG reviewed as per emr, pt declines any stress test suggestion today at any rate, exam benign, would otherwise hold on cxr today as well, most c/w msk with lifting boxes recently

## 2014-04-14 NOTE — Progress Notes (Signed)
Subjective:    Patient ID: [name], female    DOB: Jun 20, 1960, 54 y.o.   MRN: [id number]  HPI  Here to f/u; overall doing ok,  Pt denies, increased sob or doe, wheezing, orthopnea, PND, increased LE swelling, palpitations, dizziness or syncope.  Pt denies polydipsia, polyuria, or low sugar symptoms such as weakness or confusion improved with po intake.  Pt denies new neurological symptoms such as new headache, or facial or extremity weakness or numbness.   Pt states overall good compliance with meds, has been trying to follow lower cholesterol, diabetic diet, with wt overall stable,  but little exercise however, but plans to do more, now that her right knee is improved.  At least wt has been stbale without further gain.  Also with 1 wk intermittent sharp pleuritic CP below left breast without assoc sob, n/v, diaphoresis, palp or syncope Past Medical History  Diagnosis Date  . Hypertension   . Asthma   . Obesity (BMI 30-39.9)   . Alcohol abuse     abuse- moderate years ago - states only drinks now on weekends -2 to 8 driinks   . DJD (degenerative joint disease)     right knee, mot to severe  . Anxiety   . Panic   . COLONIC POLYPS, HX OF 04/05/2010  . DIVERTICULITIS, HX OF 04/05/2010  . PALPITATIONS, HX OF 09/14/2007  . Heart murmur     hx of   . Depression 02/10/2011    20 yrs ago - pt states no longer a problem  . GERD (gastroesophageal reflux disease)     no meds  . Hyperlipidemia   . Impaired glucose tolerance 12/06/2013   Past Surgical History  Procedure Laterality Date  . Vesicovaginal fistula closure w/ tah    . Abdominal hysterectomy    . Knee arthroscopy      left   . Total knee arthroplasty  07/29/2011    Procedure: TOTAL KNEE ARTHROPLASTY;  Surgeon: Mauri Pole, MD;  Location: [facility];  Service: Orthopedics;  Laterality: Right;  . Total knee arthroplasty Left 12/14/2012    Procedure: LEFT TOTAL KNEE ARTHROPLASTY;  Surgeon: Mauri Pole, MD;  Location: [facility];   Service: Orthopedics;  Laterality: Left;    reports that she quit smoking about 26 years ago. Her smoking use included Cigarettes. She has a 3.5 pack-year smoking history. She has never used smokeless tobacco. She reports that she drinks alcohol. She reports that she does not use illicit drugs. family history includes COPD in her father; Lymphoma in her sister; Stroke in her mother. Allergies  Allergen Reactions  . Morphine Itching  . Shrimp [Shellfish Allergy] Itching  . Latex Rash   Current Outpatient Prescriptions on File Prior to Visit  Medication Sig Dispense Refill  . ADVAIR DISKUS 250-50 MCG/DOSE AEPB INHALE 1 PUFF INTO THE LUNGS 2 (TWO) TIMES DAILY. 60 each 0  . albuterol (PROAIR HFA) 108 (90 BASE) MCG/ACT inhaler Inhale 2 puffs into the lungs every 6 (six) hours as needed for wheezing or shortness of breath. 1 Inhaler 11  . atorvastatin (LIPITOR) 10 MG tablet Take 1 tablet (10 mg total) by mouth daily. 90 tablet 3  . HYDROcodone-acetaminophen (NORCO) 7.5-325 MG per tablet Take one tablet by mouth every four hours as needed for mild pain 1-5; Take two tablets by mouth every four hours as needed for severe pain 360 tablet 0  . losartan-hydrochlorothiazide (HYZAAR) 100-25 MG per tablet Take 1 tablet by mouth every  morning. 90 tablet 3  . methocarbamol (ROBAXIN) 500 MG tablet Take 1 tablet (500 mg total) by mouth every 6 (six) hours as needed for muscle spasms. 50 tablet 0  . orlistat (ALLI) 60 MG capsule Take 1 capsule (60 mg total) by mouth 3 (three) times daily with meals. 90 capsule 3   No current facility-administered medications on file prior to visit.   Review of Systems  Constitutional: Negative for unusual diaphoresis or other sweats  HENT: Negative for ringing in ear Eyes: Negative for double vision or worsening visual disturbance.  Respiratory: Negative for choking and stridor.   Gastrointestinal: Negative for vomiting or other signifcant bowel change Genitourinary:  Negative for hematuria or decreased urine volume.  Musculoskeletal: Negative for other MSK pain or swelling Skin: Negative for color change and worsening wound.  Neurological: Negative for tremors and numbness other than noted  Psychiatric/Behavioral: Negative for decreased concentration or agitation other than above       Objective:   Physical Exam BP 138/88 mmHg  Pulse 90  Temp(Src) 98 F (36.7 C) (Oral)  Resp 20  Ht 5\' 2"  (1.575 m)  Wt 276 lb 1.3 oz (125.229 kg)  BMI 50.48 kg/m2  SpO2 97% VS noted,  Constitutional: Pt appears well-developed, well-nourished.  HENT: Head: NCAT.  Right Ear: External ear normal.  Left Ear: External ear normal.  Eyes: . Pupils are equal, round, and reactive to light. Conjunctivae and EOM are normal Neck: Normal range of motion. Neck supple.  Cardiovascular: Normal rate and regular rhythm.   Pulmonary/Chest: Effort normal and breath sounds without rales or wheezing.  Abd:  Soft, NT, ND, + BS Neurological: Pt is alert. Not confused , motor grossly intact Skin: Skin is warm. No rash Psychiatric: Pt behavior is normal. No agitation.  Wt Readings from Last 3 Encounters:  04/14/14 276 lb 1.3 oz (125.229 kg)  12/06/13 277 lb (125.646 kg)  11/26/12 257 lb (116.574 kg)       Assessment & Plan:

## 2014-04-14 NOTE — Assessment & Plan Note (Signed)
stable overall by history and exam, recent data reviewed with pt, and pt to continue medical treatment as before,  to f/u any worsening symptoms or concerns Lab Results  Component Value Date   HGBA1C 5.7 04/13/2014

## 2014-04-14 NOTE — Telephone Encounter (Signed)
emmi mailed  °

## 2014-05-23 ENCOUNTER — Telehealth: Payer: Self-pay | Admitting: Internal Medicine

## 2014-05-23 NOTE — Telephone Encounter (Signed)
Needs OV  Please also inform pt that I only treat Acute pain with the Norco,  As I no longer treat chronic pain, thanks

## 2014-05-23 NOTE — Telephone Encounter (Signed)
Patient is requesting a fill of   HYDROcodone-acetaminophen (NORCO) 7.5-325 MG per tablet [[id number]]      . She states that she has pulled a muscle in her lower back.

## 2014-05-25 ENCOUNTER — Encounter: Payer: Self-pay | Admitting: Internal Medicine

## 2014-05-25 ENCOUNTER — Ambulatory Visit (INDEPENDENT_AMBULATORY_CARE_PROVIDER_SITE_OTHER): Payer: PRIVATE HEALTH INSURANCE | Admitting: Internal Medicine

## 2014-05-25 VITALS — BP 128/84 | HR 86 | Temp 99.0°F | Resp 20 | Ht 62.0 in | Wt 281.0 lb

## 2014-05-25 DIAGNOSIS — M25512 Pain in left shoulder: Secondary | ICD-10-CM | POA: Diagnosis not present

## 2014-05-25 DIAGNOSIS — M25511 Pain in right shoulder: Secondary | ICD-10-CM | POA: Diagnosis not present

## 2014-05-25 DIAGNOSIS — M545 Low back pain, unspecified: Secondary | ICD-10-CM

## 2014-05-25 DIAGNOSIS — I10 Essential (primary) hypertension: Secondary | ICD-10-CM | POA: Diagnosis not present

## 2014-05-25 MED ORDER — HYDROCODONE-ACETAMINOPHEN 7.5-325 MG PO TABS: ORAL_TABLET | ORAL | Status: DC

## 2014-05-25 NOTE — Patient Instructions (Signed)
Please take all new medication as prescribed - the pain medication (but remember this can only for a short time only)  Please continue all other medications as before, including the robaxin you have at home  Please have the pharmacy call with any other refills you may need.  Please keep your appointments with your specialists as you may have planned

## 2014-05-25 NOTE — Assessment & Plan Note (Signed)
C/w msk strain, for pain control muscle relaxer prn, gave option for note to be off work, pt declines

## 2014-05-25 NOTE — Assessment & Plan Note (Signed)
stable overall by history and exam, recent data reviewed with pt, and pt to continue medical treatment as before,  to f/u any worsening symptoms or concerns BP Readings from Last 3 Encounters:  05/25/14 128/84  04/14/14 138/88  12/06/13 120/82

## 2014-05-25 NOTE — Assessment & Plan Note (Signed)
Exam benign, suspect msk strain due to overuse, for pain control,  to f/u any worsening symptoms or concerns

## 2014-05-25 NOTE — Progress Notes (Signed)
Pre visit review using our clinic review tool, if applicable. No additional management support is needed unless otherwise documented below in the visit note. 

## 2014-05-25 NOTE — Progress Notes (Signed)
Subjective:    Patient ID: [name], female    DOB: 1960-03-18, 54 y.o.   MRN: [id number]  HPI  Here to f/u, c/o pain to both shoulders and bilat lower back now severe, after 3 wks at a new job with frequent lifting over 25 lbs for 8 hrs, while standing. Is not used to doing this.  Mentioned to boss but no sympathy.  Very stiff and sore this am, finally couldn't go to work today.  Did have a 3 day time off over a wk ago and shoulders improved pain. Pt with no bowel or bladder change, fever, wt loss,  worsening extremity pain/numbness/weakness, or falls. Denies urinary symptoms such as dysuria, frequency, urgency, flank pain, hematuria or n/v, fever, chills.  Denies worsening reflux, abd pain, dysphagia, n/v, bowel change or blood. Past Medical History  Diagnosis Date  . Hypertension   . Asthma   . Obesity (BMI 30-39.9)   . Alcohol abuse     abuse- moderate years ago - states only drinks now on weekends -2 to 8 driinks   . DJD (degenerative joint disease)     right knee, mot to severe  . Anxiety   . Panic   . COLONIC POLYPS, HX OF 04/05/2010  . DIVERTICULITIS, HX OF 04/05/2010  . PALPITATIONS, HX OF 09/14/2007  . Heart murmur     hx of   . Depression 02/10/2011    20 yrs ago - pt states no longer a problem  . GERD (gastroesophageal reflux disease)     no meds  . Hyperlipidemia   . Impaired glucose tolerance 12/06/2013   Past Surgical History  Procedure Laterality Date  . Vesicovaginal fistula closure w/ tah    . Abdominal hysterectomy    . Knee arthroscopy      left   . Total knee arthroplasty  07/29/2011    Procedure: TOTAL KNEE ARTHROPLASTY;  Surgeon: Mauri Pole, MD;  Location: [facility];  Service: Orthopedics;  Laterality: Right;  . Total knee arthroplasty Left 12/14/2012    Procedure: LEFT TOTAL KNEE ARTHROPLASTY;  Surgeon: Mauri Pole, MD;  Location: [facility];  Service: Orthopedics;  Laterality: Left;    reports that she quit smoking about 26 years ago. Her smoking use  included Cigarettes. She has a 3.5 pack-year smoking history. She has never used smokeless tobacco. She reports that she drinks alcohol. She reports that she does not use illicit drugs. family history includes COPD in her father; Lymphoma in her sister; Stroke in her mother. Allergies  Allergen Reactions  . Morphine Itching  . Shrimp [Shellfish Allergy] Itching  . Latex Rash   Current Outpatient Prescriptions on File Prior to Visit  Medication Sig Dispense Refill  . ADVAIR DISKUS 250-50 MCG/DOSE AEPB INHALE 1 PUFF INTO THE LUNGS 2 (TWO) TIMES DAILY. 60 each 0  . albuterol (PROAIR HFA) 108 (90 BASE) MCG/ACT inhaler Inhale 2 puffs into the lungs every 6 (six) hours as needed for wheezing or shortness of breath. 1 Inhaler 11  . atorvastatin (LIPITOR) 10 MG tablet Take 1 tablet (10 mg total) by mouth daily. 90 tablet 3  . losartan-hydrochlorothiazide (HYZAAR) 100-25 MG per tablet Take 1 tablet by mouth every morning. 90 tablet 3  . methocarbamol (ROBAXIN) 500 MG tablet Take 1 tablet (500 mg total) by mouth every 6 (six) hours as needed for muscle spasms. 50 tablet 0  . orlistat (ALLI) 60 MG capsule Take 1 capsule (60 mg total) by mouth 3 (three)  times daily with meals. 90 capsule 3  . Sennosides (SENNA MAXIMUM STRENGTH) 25 MG TABS 1 tab by mouth at bedtime as needed 90 each 1   No current facility-administered medications on file prior to visit.   Review of Systems All otherwise neg per pt     Objective:   Physical Exam BP 128/84 mmHg  Pulse 86  Temp(Src) 99 F (37.2 C) (Oral)  Resp 20  Ht 5\' 2"  (1.575 m)  Wt 281 lb 0.6 oz (127.479 kg)  BMI 51.39 kg/m2  SpO2 96% VS noted, not ill appearing, but stiff, moves slowly, painful to get up on exam table, needs assist due to back pain Constitutional: Pt appears in no significant distress HENT: Head: NCAT.  Right Ear: External ear normal.  Left Ear: External ear normal.  Eyes: . Pupils are equal, round, and reactive to light. Conjunctivae  and EOM are normal Neck: Normal range of motion. Neck supple.  Cardiovascular: Normal rate and regular rhythm.   Pulmonary/Chest: Effort normal and breath sounds without rales or wheezing.  Abd:  Soft, NT, ND, + BS Bilat shoulders FROm, NT Bilat lumbar paravertebral tender, spine nontender Neurological: Pt is alert. Not confused , motor grossly intact Skin: Skin is warm. No rash, no LE edema Psychiatric: Pt behavior is normal. No agitation.     Assessment & Plan:

## 2014-06-01 ENCOUNTER — Telehealth: Payer: Self-pay | Admitting: Internal Medicine

## 2014-06-01 NOTE — Telephone Encounter (Signed)
patient was in last week and Dr. Jenny Reichmann was supposed to call in [employer]. She had a coupon for free trial. They have not received it. [facility] [address]

## 2014-06-02 MED ORDER — ALBUTEROL SULFATE 108 (90 BASE) MCG/ACT IN AEPB: 1.0000 | INHALATION_SPRAY | Freq: Two times a day (BID) | RESPIRATORY_TRACT | Status: DC

## 2014-06-02 NOTE — Telephone Encounter (Signed)
Not[facility],,,/lmb

## 2014-06-08 ENCOUNTER — Telehealth: Payer: Self-pay | Admitting: Critical Care Medicine

## 2014-06-08 NOTE — Telephone Encounter (Signed)
Called and spoke to pt. Advair sample left up front for pt. Pt verbalized understanding and denied any further questions or concerns at this time.

## 2014-06-16 ENCOUNTER — Telehealth: Payer: Self-pay | Admitting: Critical Care Medicine

## 2014-06-16 NOTE — Telephone Encounter (Signed)
Called and spoke to pt. Pt requesting advair sample and coupon for albuterol. Informed pt that there aren't available samples. Pt verbalized understanding and denied any further questions or concerns at this time.

## 2014-06-28 ENCOUNTER — Other Ambulatory Visit: Payer: Self-pay | Admitting: Critical Care Medicine

## 2014-06-28 ENCOUNTER — Telehealth: Payer: Self-pay | Admitting: Critical Care Medicine

## 2014-06-28 NOTE — Telephone Encounter (Signed)
Received refill request for Advair.   Pt has a pending appt with Dr. Joya Gaskins on July 19, 2014. Rx sent x 1 with instructions to please keep appt for additional rxs.

## 2014-06-28 NOTE — Telephone Encounter (Signed)
Pt has upcoming appointment with PW 07/19/14. Will leave samples at the front desk. Pt is aware. Nothing further was needed.

## 2014-07-19 ENCOUNTER — Ambulatory Visit: Payer: PRIVATE HEALTH INSURANCE | Admitting: Critical Care Medicine

## 2014-07-24 ENCOUNTER — Other Ambulatory Visit: Payer: Self-pay | Admitting: Critical Care Medicine

## 2014-08-02 ENCOUNTER — Telehealth: Payer: Self-pay | Admitting: Internal Medicine

## 2014-08-02 MED ORDER — HYDROCODONE-ACETAMINOPHEN 7.5-325 MG PO TABS: ORAL_TABLET | ORAL | Status: DC

## 2014-08-02 NOTE — Telephone Encounter (Signed)
Done hardcopy to steph 

## 2014-08-02 NOTE — Telephone Encounter (Signed)
Patient requesting refill for HYDROcodone-acetaminophen (NORCO) 7.5-325 MG per tablet [[id number]] and Fluticasone-Salmeterol (ADVAIR DISKUS) 250-50 MCG/DOSE AEPB [[id number]]. [facility] [address]   Also, She wanted to let you know that [facility] can't see her till 7/29. Please call her at [phone]

## 2014-08-02 NOTE — Telephone Encounter (Signed)
Advair managed by Pulmonary, please advise on Hydrocodone

## 2014-08-03 ENCOUNTER — Other Ambulatory Visit: Payer: Self-pay | Admitting: Internal Medicine

## 2014-09-06 ENCOUNTER — Other Ambulatory Visit: Payer: Self-pay | Admitting: Internal Medicine

## 2014-09-06 ENCOUNTER — Other Ambulatory Visit: Payer: Self-pay

## 2014-09-06 MED ORDER — FLUTICASONE-SALMETEROL 250-50 MCG/DOSE IN AEPB: INHALATION_SPRAY | RESPIRATORY_TRACT | Status: DC

## 2014-09-30 ENCOUNTER — Other Ambulatory Visit: Payer: Self-pay | Admitting: Internal Medicine

## 2014-10-02 ENCOUNTER — Encounter: Payer: Self-pay | Admitting: Internal Medicine

## 2014-10-02 ENCOUNTER — Ambulatory Visit (INDEPENDENT_AMBULATORY_CARE_PROVIDER_SITE_OTHER): Payer: PRIVATE HEALTH INSURANCE | Admitting: Internal Medicine

## 2014-10-02 ENCOUNTER — Other Ambulatory Visit (INDEPENDENT_AMBULATORY_CARE_PROVIDER_SITE_OTHER): Payer: PRIVATE HEALTH INSURANCE

## 2014-10-02 VITALS — BP 136/84 | HR 103 | Ht 61.0 in | Wt 275.0 lb

## 2014-10-02 DIAGNOSIS — R7302 Impaired glucose tolerance (oral): Secondary | ICD-10-CM

## 2014-10-02 DIAGNOSIS — Z0189 Encounter for other specified special examinations: Secondary | ICD-10-CM | POA: Diagnosis not present

## 2014-10-02 DIAGNOSIS — R058 Other specified cough: Secondary | ICD-10-CM

## 2014-10-02 DIAGNOSIS — Z0000 Encounter for general adult medical examination without abnormal findings: Secondary | ICD-10-CM

## 2014-10-02 DIAGNOSIS — R05 Cough: Secondary | ICD-10-CM | POA: Diagnosis not present

## 2014-10-02 DIAGNOSIS — J4530 Mild persistent asthma, uncomplicated: Secondary | ICD-10-CM

## 2014-10-02 LAB — URINALYSIS, ROUTINE W REFLEX MICROSCOPIC
Bilirubin Urine: NEGATIVE
Hgb urine dipstick: NEGATIVE
Ketones, ur: NEGATIVE
Leukocytes, UA: NEGATIVE
Nitrite: NEGATIVE
RBC / HPF: NONE SEEN (ref 0–?)
Specific Gravity, Urine: 1.015 (ref 1.000–1.030)
Total Protein, Urine: NEGATIVE
Urine Glucose: NEGATIVE
Urobilinogen, UA: 1 (ref 0.0–1.0)
WBC, UA: NONE SEEN (ref 0–?)
pH: 7 (ref 5.0–8.0)

## 2014-10-02 LAB — BASIC METABOLIC PANEL
BUN: 14 mg/dL (ref 6–23)
CO2: 28 mEq/L (ref 19–32)
Calcium: 9.1 mg/dL (ref 8.4–10.5)
Chloride: 105 mEq/L (ref 96–112)
Creatinine, Ser: 0.8 mg/dL (ref 0.40–1.20)
GFR: 96.31 mL/min (ref >60.00)
Glucose, Bld: 85 mg/dL (ref 70–99)
Potassium: 3.4 mEq/L — ABNORMAL LOW (ref 3.5–5.1)
Sodium: 141 mEq/L (ref 135–145)

## 2014-10-02 LAB — LIPID PANEL
Cholesterol: 168 mg/dL (ref 0–200)
HDL: 63.9 mg/dL (ref >39.00)
LDL Cholesterol: 91 mg/dL (ref 0–99)
NonHDL: 103.96
Total CHOL/HDL Ratio: 3
Triglycerides: 64 mg/dL (ref 0.0–149.0)
VLDL: 12.8 mg/dL (ref 0.0–40.0)

## 2014-10-02 LAB — CBC WITH DIFFERENTIAL/PLATELET
Basophils Absolute: 0 10*3/uL (ref 0.0–0.1)
Basophils Relative: 0.3 % (ref 0.0–3.0)
Eosinophils Absolute: 0.2 10*3/uL (ref 0.0–0.7)
Eosinophils Relative: 1.6 % (ref 0.0–5.0)
HCT: 42 % (ref 36.0–46.0)
Hemoglobin: 14 g/dL (ref 12.0–15.0)
Lymphocytes Relative: 17.5 % (ref 12.0–46.0)
Lymphs Abs: 2 10*3/uL (ref 0.7–4.0)
MCHC: 33.3 g/dL (ref 30.0–36.0)
MCV: 88.5 fl (ref 78.0–100.0)
Monocytes Absolute: 0.7 10*3/uL (ref 0.1–1.0)
Monocytes Relative: 5.7 % (ref 3.0–12.0)
Neutro Abs: 8.6 10*3/uL — ABNORMAL HIGH (ref 1.4–7.7)
Neutrophils Relative %: 74.9 % (ref 43.0–77.0)
Platelets: 234 10*3/uL (ref 150.0–400.0)
RBC: 4.75 Mil/uL (ref 3.87–5.11)
RDW: 14.4 % (ref 11.5–15.5)
WBC: 11.5 10*3/uL — ABNORMAL HIGH (ref 4.0–10.5)

## 2014-10-02 LAB — HEPATIC FUNCTION PANEL
ALT: 31 U/L (ref 0–35)
AST: 29 U/L (ref 0–37)
Albumin: 3.7 g/dL (ref 3.5–5.2)
Alkaline Phosphatase: 87 U/L (ref 39–117)
Bilirubin, Direct: 0.1 mg/dL (ref 0.0–0.3)
Total Bilirubin: 0.3 mg/dL (ref 0.2–1.2)
Total Protein: 7 g/dL (ref 6.0–8.3)

## 2014-10-02 LAB — HEMOGLOBIN A1C: Hgb A1c MFr Bld: 5.7 % (ref 4.6–6.5)

## 2014-10-02 LAB — TSH: TSH: 0.54 u[IU]/mL (ref 0.35–4.50)

## 2014-10-02 MED ORDER — FAMOTIDINE 20 MG PO TABS: ORAL_TABLET | ORAL | Status: DC

## 2014-10-02 MED ORDER — PANTOPRAZOLE SODIUM 40 MG PO TBEC: 40.0000 mg | DELAYED_RELEASE_TABLET | Freq: Every day | ORAL | Status: DC

## 2014-10-02 MED ORDER — HYDROCODONE-ACETAMINOPHEN 7.5-325 MG PO TABS: ORAL_TABLET | ORAL | Status: DC

## 2014-10-02 MED ORDER — PREDNISONE 10 MG PO TABS: ORAL_TABLET | ORAL | Status: DC

## 2014-10-02 NOTE — Patient Instructions (Addendum)
Pantoprazole (protonix) 40 mg  Take  30-60 min before first meal of the day and Pepcid (famotidine)  20 mg one @  bedtime until return to office - this is the best way to tell whether stomach acid is contributing to your problem.    Stop advair   Only use your albuterol as a rescue medication to be used if you can't catch your breath by resting or doing a relaxed purse lip breathing pattern.  - The less you use it, the better it will work when you need it. - Ok to use up to 2 puffs  every 4 hours if you must but call for immediate appointment if use goes up over your usual need - Don't leave home without it !!  (think of it like the spare tire for your car)   GERD (REFLUX)  is an extremely common cause of respiratory symptoms just like yours , many times with no obvious heartburn at all.    It can be treated with medication, but also with lifestyle changes including elevation of the head of your bed (ideally with 6 inch  bed blocks),  Smoking cessation, avoidance of late meals, excessive alcohol, and avoid fatty foods, chocolate, peppermint, colas, red wine, and acidic juices such as orange juice.  NO MINT OR MENTHOL PRODUCTS SO NO COUGH DROPS  USE SUGARLESS CANDY INSTEAD (Jolley ranchers or Stover's or Life Savers) or even ice chips will also do - the key is to swallow to prevent all throat clearing. NO OIL BASED VITAMINS - use powdered substitutes.  Take delsym two tsp every 12 hours and supplement if needed with norco 1 every 4 hours to suppress the urge to cough. Swallowing water or using ice chips/non mint and menthol containing candies (such as lifesavers or sugarless jolly ranchers) are also effective.  You should rest your voice and avoid activities that you know make you cough.  Once you have eliminated the cough for 3 straight days try reducing the norco  first,  then the delsym as tolerated.    If not improving > Prednisone 10 mg take  4 each am x 2 days,   2 each am x 2 days,  1 each  am x 2 days and stop   Return in 2 weeks to see Dr Joya Gaskins Tammy np or me

## 2014-10-02 NOTE — Progress Notes (Signed)
  Subjective:    Patient ID: [name], female    DOB: 1960/07/04, 54 y.o.   MRN: [id number]  HPI  11/26/2012 Chief Complaint  Patient presents with  . surgical clearance    ten. sx for 11.4.14 for  L total knee replacement.  Pt denies SOB, cough, chest tightness.  Since last ov. DOing well.  Dr Alvan Dame is Psychologist, sport and exercise.  Needs R TKR. Now no real cough. 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 Acute Visit    Pt of PW. Pt c/o constant throat clearing, productive cough with white to clear mucus, and mild wheezing x 1 week Recent sinus infection treated by PCP. Pt denies fever, chest pain or discomfort    symptoms daytime only, no better with saba/ all started with sense of nasal congestion/pnds  No obvious day to day or daytime variability or assoc chronic cough or cp or chest tightness, subjective wheeze or overt  hb symptoms. No unusual exp hx or h/o childhood pna/ asthma or knowledge of premature birth.  Sleeping ok without nocturnal  or early am exacerbation  of respiratory  c/o's or need for noct saba. Also denies any obvious fluctuation of symptoms with weather or environmental changes or other aggravating or alleviating factors except as outlined above   Current Medications, Allergies, Complete Past Medical History, Past Surgical History, Family History, and Social History were reviewed in [employer] record.  ROS  The following are not active complaints unless bolded sore throat, dysphagia, dental problems, itching, sneezing,  nasal congestion or excess/ purulent secretions, ear ache,   fever, chills, sweats, unintended wt loss, classically pleuritic or exertional cp, hemoptysis,  orthopnea pnd or leg swelling, presyncope, palpitations, abdominal pain, anorexia, nausea, vomiting, diarrhea  or change in bowel or bladder habits, change in stools or urine,  dysuria,hematuria,  rash, arthralgias, visual complaints, headache, numbness, weakness or ataxia or problems with walking or coordination,  change in mood/affect or memory.                 Objective:   Physical Exam  Obese amb bf hoarse with incessant dry throat clearing   Wt Readings from Last 3 Encounters:  10/02/14 275 lb (124.739 kg)  05/25/14 281 lb 0.6 oz (127.479 kg)  04/14/14 276 lb 1.3 oz (125.229 kg)    Vital signs reviewed    Gen: anxious  ENT: No lesions,  mouth clear,  oropharynx pristine, no postnasal drip  Neck: No JVD, no TMG, no carotid bruits  Lungs: No use of accessory muscles, no dullness to percussion, clear without rales or rhonchi bilaterally   Cardiovascular: RRR, heart sounds normal, no murmur or gallops, no peripheral edema  Abdomen: soft and NT, no HSM,  BS normal  Musculoskeletal: No deformities, no cyanosis or clubbing  Neuro: alert, non focal  Skin: Warm, no lesions or rashes        Assessment & Plan:

## 2014-10-03 ENCOUNTER — Encounter: Payer: Self-pay | Admitting: Internal Medicine

## 2014-10-03 ENCOUNTER — Ambulatory Visit: Payer: PRIVATE HEALTH INSURANCE | Admitting: Internal Medicine

## 2014-10-03 ENCOUNTER — Telehealth: Payer: Self-pay | Admitting: Internal Medicine

## 2014-10-03 DIAGNOSIS — R05 Cough: Secondary | ICD-10-CM | POA: Insufficient documentation

## 2014-10-03 DIAGNOSIS — R058 Other specified cough: Secondary | ICD-10-CM | POA: Insufficient documentation

## 2014-10-03 DIAGNOSIS — Z6841 Body Mass Index (BMI) 40.0 and over, adult: Secondary | ICD-10-CM | POA: Insufficient documentation

## 2014-10-03 HISTORY — DX: Morbid (severe) obesity due to excess calories: E66.01

## 2014-10-03 NOTE — Telephone Encounter (Signed)
Pt advised that labs haven't been resulted.

## 2014-10-03 NOTE — Assessment & Plan Note (Signed)
The most common causes of chronic cough in immunocompetent adults include the following: upper airway cough syndrome (UACS), previously referred to as postnasal drip syndrome (PNDS), which is caused by variety of rhinosinus conditions; (2) asthma; (3) GERD; (4) chronic bronchitis from cigarette smoking or other inhaled environmental irritants; (5) nonasthmatic eosinophilic bronchitis; and (6) bronchiectasis.   These conditions, singly or in combination, have accounted for up to 94% of the causes of chronic cough in prospective studies.   Other conditions have constituted no >6% of the causes in prospective studies These have included bronchogenic carcinoma, chronic interstitial pneumonia, sarcoidosis, left ventricular failure, ACEI-induced cough, and aspiration from a condition associated with pharyngeal dysfunction.    Chronic cough is often simultaneously caused by more than one condition. A single cause has been found from 38 to 82% of the time, multiple causes from 18 to 62%. Multiply caused cough has been the result of three diseases up to 42% of the time.       Based on hx and exam, this is most likely:  Classic Upper airway cough syndrome, so named because it's frequently impossible to sort out how much is  CR/sinusitis with freq throat clearing (which can be related to primary GERD)   vs  causing  secondary (" extra esophageal")  GERD from wide swings in gastric pressure that occur with throat clearing, often  promoting self use of mint and menthol lozenges that reduce the lower esophageal sphincter tone and exacerbate the problem further in a cyclical fashion.   These are the same pts (now being labeled as having "irritable larynx syndrome" by some cough centers) who not infrequently have a history of having failed to tolerate ace inhibitors,  dry powder inhalers or biphosphonates or report having atypical reflux symptoms that don't respond to standard doses of PPI , and are easily confused as  having aecopd or asthma flares by even experienced allergists/ pulmonologists.   The first step is to maximize acid suppression and eliminate advair and cyclical coughing then regroup in 2 weeks  I had an extended discussion with the patient reviewing all relevant studies completed to date and  lasting 25 min  1) Explained: The standardized cough guidelines published in Chest by Lissa Morales in 2006 are still the best available and consist of a multiple step process (up to 12!) , not a single office visit,  and are intended  to address this problem logically,  with an alogrithm dependent on response to empiric treatment at  each progressive step  to determine a specific diagnosis with  minimal addtional testing needed. Therefore if adherence is an issue or can't be accurately verified,  it's very unlikely the standard evaluation and treatment will be successful here.    Furthermore, response to therapy (other than acute cough suppression, which should only be used short term with avoidance of narcotic containing cough syrups if possible), can be a gradual process for which the patient may perceive immediate benefit.  Unlike going to an eye doctor where the best perscription is almost always the first one and is immediately effective, this is almost never the case in the management of chronic cough syndromes. Therefore the patient needs to commit up front to consistently adhere to recommendations  for up to 6 weeks of therapy directed at the likely underlying problem(s) before the response can be reasonably evaluated.     2) Each maintenance medication was reviewed in detail including most importantly the difference between maintenance and prns and under  what circumstances the prns are to be triggered using an action plan format that is not reflected in the computer generated alphabetically organized AVS.    Please see instructions for details which were reviewed in writing and the patient given a  copy highlighting the part that I personally wrote and discussed at today's ov.   See instructions for specific recommendations which were reviewed directly with the patient who was given a copy with highlighter outlining the key components.

## 2014-10-03 NOTE — Telephone Encounter (Signed)
Pt called in and would like a nurse to call her back about her labs

## 2014-10-03 NOTE — Assessment & Plan Note (Signed)
Moderate persistent asthma with atopic features and GERD as ppt factors in addition to [place]: 11/2012: FeV1 94% FVC 108%  No evidence at all to support dx of asthma here > try off advair then consider dulera or symb hfa low dose if asthma rx needed

## 2014-10-03 NOTE — Assessment & Plan Note (Signed)
Body mass index is 51.99 kg/(m^2).  Lab Results  Component Value Date   TSH 0.54 10/02/2014     Contributing to gerd tendency/ doe/reviewed need  achieve and maintain neg calorie balance > defer f/u primary care including intermittently monitoring thyroid status

## 2014-10-04 ENCOUNTER — Ambulatory Visit (INDEPENDENT_AMBULATORY_CARE_PROVIDER_SITE_OTHER): Payer: PRIVATE HEALTH INSURANCE | Admitting: Internal Medicine

## 2014-10-04 ENCOUNTER — Encounter: Payer: Self-pay | Admitting: Internal Medicine

## 2014-10-04 VITALS — BP 126/74 | HR 96 | Temp 98.0°F | Ht 62.0 in | Wt 275.0 lb

## 2014-10-04 DIAGNOSIS — R7302 Impaired glucose tolerance (oral): Secondary | ICD-10-CM

## 2014-10-04 DIAGNOSIS — E876 Hypokalemia: Secondary | ICD-10-CM

## 2014-10-04 DIAGNOSIS — Z0000 Encounter for general adult medical examination without abnormal findings: Secondary | ICD-10-CM | POA: Diagnosis not present

## 2014-10-04 MED ORDER — POTASSIUM CHLORIDE ER 10 MEQ PO TBCR: 10.0000 meq | EXTENDED_RELEASE_TABLET | Freq: Every day | ORAL | Status: DC

## 2014-10-04 NOTE — Assessment & Plan Note (Signed)
stable overall by history and exam, recent data reviewed with pt, and pt to continue medical treatment as before,  to f/u any worsening symptoms or concerns Lab Results  Component Value Date   HGBA1C 5.7 10/02/2014

## 2014-10-04 NOTE — Assessment & Plan Note (Signed)

## 2014-10-04 NOTE — Progress Notes (Signed)
Subjective:    Patient ID: [name], female    DOB: 17-Jun-1960, 54 y.o.   MRN: [id number]  HPI  Here for wellness and f/u;  Overall doing ok;  Pt denies Chest pain, worsening SOB, DOE, wheezing, orthopnea, PND, worsening LE edema, palpitations, dizziness or syncope.  Pt denies neurological change such as new headache, facial or extremity weakness.  Pt denies polydipsia, polyuria, or low sugar symptoms. Pt states overall good compliance with treatment and medications, good tolerability, and has been trying to follow appropriate diet.  Pt denies worsening depressive symptoms, suicidal ideation or panic. No fever, night sweats, wt loss, loss of appetite, or other constitutional symptoms.  Pt states good ability with ADL's, has low fall risk, home safety reviewed and adequate, no other significant changes in hearing or vision, and only occasionally active with exercise. Seen per pDr Melvyn Novas recently with cough. And reflux, given limited norco and improved antireflux meds. 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  Decliens tetanus. Past Medical History  Diagnosis Date  . Hypertension   . Asthma   . Obesity (BMI 30-39.9)   . Alcohol abuse     abuse- moderate years ago - states only drinks now on weekends -2 to 8 driinks   . DJD (degenerative joint disease)     right knee, mot to severe  . Anxiety   . Panic   . COLONIC POLYPS, HX OF 04/05/2010  . DIVERTICULITIS, HX OF 04/05/2010  . PALPITATIONS, HX OF 09/14/2007  . Heart murmur     hx of   . Depression 02/10/2011    20 yrs ago - pt states no longer a problem  . GERD (gastroesophageal reflux disease)     no meds  . Hyperlipidemia   . Impaired glucose tolerance 12/06/2013   Past Surgical History  Procedure Laterality Date  . Vesicovaginal fistula closure w/ tah    . Abdominal hysterectomy    . Knee arthroscopy      left   . Total knee arthroplasty  07/29/2011    Procedure: TOTAL KNEE ARTHROPLASTY;  Surgeon: Mauri Pole, MD;  Location: [facility];  Service: Orthopedics;  Laterality: Right;  . Total knee arthroplasty Left 12/14/2012    Procedure: LEFT TOTAL KNEE ARTHROPLASTY;  Surgeon: Mauri Pole, MD;  Location: [facility];  Service: Orthopedics;  Laterality: Left;    reports that she quit smoking about 26 years ago. Her smoking use included Cigarettes. She has a 3.5 pack-year smoking history. She has never used smokeless tobacco. She reports that she drinks alcohol. She reports that she does not use illicit drugs. family history includes COPD in her father; Lymphoma in her sister; Stroke in her mother. Allergies  Allergen Reactions  . Morphine Itching  . Shrimp [Shellfish Allergy] Itching  . Latex Rash   Current Outpatient Prescriptions on File Prior to Visit  Medication Sig Dispense Refill  . albuterol (PROAIR HFA) 108 (90 BASE) MCG/ACT inhaler Inhale 2 puffs into the lungs every 6 (six) hours as needed for wheezing or shortness of breath. 1 Inhaler 11  . atorvastatin (LIPITOR) 10 MG tablet Take 1 tablet (10 mg total) by mouth daily. 90 tablet 3  . famotidine (PEPCID) 20 MG tablet One at bedtime 30 tablet 2  . HYDROcodone-acetaminophen (NORCO) 7.5-325 MG per tablet Take one tablet by mouth every 6 hrs as needed for pain 60 tablet 0  . losartan-hydrochlorothiazide (HYZAAR) 100-25 MG per tablet Take 1 tablet by mouth every morning.  90 tablet 3  . methocarbamol (ROBAXIN) 500 MG tablet Take 1 tablet (500 mg total) by mouth every 6 (six) hours as needed for muscle spasms. (Patient not taking: Reported on 10/02/2014) 50 tablet 0  . orlistat (ALLI) 60 MG capsule Take 1 capsule (60 mg total) by mouth 3 (three) times daily with meals. (Patient not taking: Reported on 10/02/2014) 90 capsule 3  . pantoprazole (PROTONIX) 40 MG tablet Take 1 tablet (40 mg total) by mouth daily. Take 30-60 min before first meal of the day 30 tablet 2  . predniSONE (DELTASONE) 10 MG tablet Take  4 each am x 2 days,   2 each am x 2 days,  1  each am x 2 days and stop 14 tablet 0  . Sennosides (SENNA MAXIMUM STRENGTH) 25 MG TABS 1 tab by mouth at bedtime as needed (Patient not taking: Reported on 10/02/2014) 90 each 1   No current facility-administered medications on file prior to visit.    Review of Systems Constitutional: Negative for increased diaphoresis, other activity, appetite or siginficant weight change other than noted HENT: Negative for worsening hearing loss, ear pain, facial swelling, mouth sores and neck stiffness.   Eyes: Negative for other worsening pain, redness or visual disturbance.  Respiratory: Negative for shortness of breath and wheezing  Cardiovascular: Negative for chest pain and palpitations.  Gastrointestinal: Negative for diarrhea, blood in stool, abdominal distention or other pain Genitourinary: Negative for hematuria, flank pain or change in urine volume.  Musculoskeletal: Negative for myalgias or other joint complaints.  Skin: Negative for color change and wound or drainage.  Neurological: Negative for syncope and numbness. other than noted Hematological: Negative for adenopathy. or other swelling Psychiatric/Behavioral: Negative for hallucinations, SI, self-injury, decreased concentration or other worsening agitation.      Objective:   Physical Exam BP 126/74 mmHg  Pulse 96  Temp(Src) 98 F (36.7 C) (Oral)  Ht 5\' 2"  (1.575 m)  Wt 275 lb (124.739 kg)  BMI 50.29 kg/m2  SpO2 97% VS noted,  Constitutional: Pt is oriented to person, place, and time. Appears well-developed and well-nourished, in no significant distress Head: Normocephalic and atraumatic.  Right Ear: External ear normal.  Left Ear: External ear normal.  Nose: Nose normal.  Mouth/Throat: Oropharynx is clear and moist.  Eyes: Conjunctivae and EOM are normal. Pupils are equal, round, and reactive to light.  Neck: Normal range of motion. Neck supple. No JVD present. No tracheal deviation present or significant neck LA or  mass Cardiovascular: Normal rate, regular rhythm, normal heart sounds and intact distal pulses.   Pulmonary/Chest: Effort normal and breath sounds without rales or wheezing  Abdominal: Soft. Bowel sounds are normal. NT. No HSM  Musculoskeletal: Normal range of motion. Exhibits no edema.  Lymphadenopathy:  Has no cervical adenopathy.  Neurological: Pt is alert and oriented to person, place, and time. Pt has normal reflexes. No cranial nerve deficit. Motor grossly intact Skin: Skin is warm and dry. No rash noted.  Psychiatric:  Has normal mood and affect. Behavior is normal.      Assessment & Plan:

## 2014-10-04 NOTE — Assessment & Plan Note (Signed)
Mild on HCT as part of hyzaar; to add kdur 10 qd

## 2014-10-04 NOTE — Progress Notes (Signed)
Pre visit review using our clinic review tool, if applicable. No additional management support is needed unless otherwise documented below in the visit note. 

## 2014-10-04 NOTE — Patient Instructions (Signed)
Please take all new medication as prescribed - the potassium pill  Please continue all other medications as before, and refills have been done if requested.  Please have the pharmacy call with any other refills you may need.  Please continue your efforts at being more active, low cholesterol diet, and weight control.  You are otherwise up to date with prevention measures today.  Please keep your appointments with your specialists as you may have planned  Please return in 1 year for your yearly visit, or sooner if needed, with Lab testing done 3-5 days before

## 2014-10-06 ENCOUNTER — Ambulatory Visit: Payer: PRIVATE HEALTH INSURANCE

## 2014-10-06 ENCOUNTER — Other Ambulatory Visit: Payer: Self-pay

## 2014-10-06 ENCOUNTER — Ambulatory Visit
Admission: RE | Admit: 2014-10-06 | Discharge: 2014-10-06 | Disposition: A | Discharge status: Home / Self Care | Payer: PRIVATE HEALTH INSURANCE | Source: Ambulatory Visit

## 2014-10-06 DIAGNOSIS — Z1231 Encounter for screening mammogram for malignant neoplasm of breast: Secondary | ICD-10-CM

## 2014-10-20 ENCOUNTER — Telehealth: Payer: Self-pay | Admitting: *Deleted

## 2014-10-20 MED ORDER — ALBUTEROL SULFATE HFA 108 (90 BASE) MCG/ACT IN AERS: 2.0000 | INHALATION_SPRAY | Freq: Four times a day (QID) | RESPIRATORY_TRACT | Status: DC | PRN

## 2014-10-20 NOTE — Telephone Encounter (Signed)
Left msg on triage needing to get refill on her Proair. Was getting from dr. Joya Gaskins but he has retired. Pt had cpx back in Aug notified pt [facility].../lmb

## 2014-12-15 ENCOUNTER — Other Ambulatory Visit: Payer: Self-pay | Admitting: Internal Medicine

## 2014-12-15 ENCOUNTER — Telehealth: Payer: Self-pay | Admitting: Internal Medicine

## 2014-12-15 MED ORDER — LOSARTAN POTASSIUM-HCTZ 100-25 MG PO TABS: 1.0000 | ORAL_TABLET | Freq: Every morning | ORAL | Status: DC

## 2014-12-15 NOTE — Telephone Encounter (Signed)
pts husband called requesting refill for her blood pressure medication but he did not know the name of it.  Pharmacy is [facility] on [address]

## 2014-12-27 ENCOUNTER — Telehealth: Payer: Self-pay | Admitting: Internal Medicine

## 2014-12-27 MED ORDER — FLUOCINONIDE-E 0.05 % EX CREA: 1.0000 "application " | TOPICAL_CREAM | Freq: Two times a day (BID) | CUTANEOUS | Status: DC

## 2014-12-27 MED ORDER — HYDROCODONE-ACETAMINOPHEN 7.5-325 MG PO TABS: ORAL_TABLET | ORAL | Status: DC

## 2014-12-27 NOTE — Telephone Encounter (Signed)
Please advise on refill of Hydrocodone and Lidex, thanks

## 2014-12-27 NOTE — Telephone Encounter (Signed)
Lidex done erx  Hydrocodone Done hardcopy to [facility]

## 2014-12-27 NOTE — Telephone Encounter (Signed)
Pt requesting refill for HYDROcodone-acetaminophen (NORCO) 7.5-325 MG per tablet [id number] for her shoulder pain She is also requesting some cream that was prescribed to her about a year or so ago for a skin rash she has. That should go to [facility] on [address] Please call her at [phone]

## 2014-12-28 NOTE — Telephone Encounter (Signed)
Pt informed via home VM, Rx in cabinet for pt pick up

## 2015-03-05 ENCOUNTER — Telehealth: Payer: Self-pay | Admitting: *Deleted

## 2015-03-05 DIAGNOSIS — E785 Hyperlipidemia, unspecified: Secondary | ICD-10-CM

## 2015-03-05 NOTE — Telephone Encounter (Signed)
Received call pt states she take Lipitor for her cholesterol wanting to know does she need to have labs check, also to check her liver functions. Inform pt md is out of office will return tomorrow...[name]

## 2015-03-06 NOTE — Telephone Encounter (Signed)
Ok for labs at her convenience

## 2015-03-06 NOTE — Telephone Encounter (Signed)
Called pt no answer LMOM with md response.../lmb 

## 2015-03-09 ENCOUNTER — Other Ambulatory Visit (INDEPENDENT_AMBULATORY_CARE_PROVIDER_SITE_OTHER): Payer: PRIVATE HEALTH INSURANCE

## 2015-03-09 ENCOUNTER — Telehealth: Payer: Self-pay | Admitting: Internal Medicine

## 2015-03-09 ENCOUNTER — Other Ambulatory Visit: Payer: Self-pay | Admitting: Internal Medicine

## 2015-03-09 DIAGNOSIS — R7302 Impaired glucose tolerance (oral): Secondary | ICD-10-CM

## 2015-03-09 DIAGNOSIS — E785 Hyperlipidemia, unspecified: Secondary | ICD-10-CM | POA: Diagnosis not present

## 2015-03-09 DIAGNOSIS — Z0000 Encounter for general adult medical examination without abnormal findings: Secondary | ICD-10-CM | POA: Diagnosis not present

## 2015-03-09 LAB — URINALYSIS, ROUTINE W REFLEX MICROSCOPIC
Bilirubin Urine: NEGATIVE
Hgb urine dipstick: NEGATIVE
Ketones, ur: NEGATIVE
Leukocytes, UA: NEGATIVE
Nitrite: NEGATIVE
Specific Gravity, Urine: >=1.03 — AB (ref 1.000–1.030)
Total Protein, Urine: NEGATIVE
Urine Glucose: NEGATIVE
Urobilinogen, UA: 0.2 (ref 0.0–1.0)
pH: 5.5 (ref 5.0–8.0)

## 2015-03-09 LAB — CBC WITH DIFFERENTIAL/PLATELET
Basophils Absolute: 0 10*3/uL (ref 0.0–0.1)
Basophils Relative: 0.4 % (ref 0.0–3.0)
Eosinophils Absolute: 0.2 10*3/uL (ref 0.0–0.7)
Eosinophils Relative: 1.9 % (ref 0.0–5.0)
HCT: 42.8 % (ref 36.0–46.0)
Hemoglobin: 14 g/dL (ref 12.0–15.0)
Lymphocytes Relative: 22.3 % (ref 12.0–46.0)
Lymphs Abs: 2.3 10*3/uL (ref 0.7–4.0)
MCHC: 32.6 g/dL (ref 30.0–36.0)
MCV: 88 fl (ref 78.0–100.0)
Monocytes Absolute: 0.8 10*3/uL (ref 0.1–1.0)
Monocytes Relative: 7.8 % (ref 3.0–12.0)
Neutro Abs: 7.1 10*3/uL (ref 1.4–7.7)
Neutrophils Relative %: 67.6 % (ref 43.0–77.0)
Platelets: 256 10*3/uL (ref 150.0–400.0)
RBC: 4.86 Mil/uL (ref 3.87–5.11)
RDW: 13.9 % (ref 11.5–15.5)
WBC: 10.5 10*3/uL (ref 4.0–10.5)

## 2015-03-09 LAB — HEPATIC FUNCTION PANEL
ALT: 18 U/L (ref 0–35)
AST: 21 U/L (ref 0–37)
Albumin: 3.7 g/dL (ref 3.5–5.2)
Alkaline Phosphatase: 88 U/L (ref 39–117)
Bilirubin, Direct: 0.1 mg/dL (ref 0.0–0.3)
Total Bilirubin: 0.4 mg/dL (ref 0.2–1.2)
Total Protein: 7 g/dL (ref 6.0–8.3)

## 2015-03-09 LAB — BASIC METABOLIC PANEL
BUN: 17 mg/dL (ref 6–23)
CO2: 29 mEq/L (ref 19–32)
Calcium: 9.3 mg/dL (ref 8.4–10.5)
Chloride: 104 mEq/L (ref 96–112)
Creatinine, Ser: 0.89 mg/dL (ref 0.40–1.20)
GFR: 85.02 mL/min (ref >60.00)
Glucose, Bld: 86 mg/dL (ref 70–99)
Potassium: 3.5 mEq/L (ref 3.5–5.1)
Sodium: 140 mEq/L (ref 135–145)

## 2015-03-09 LAB — LIPID PANEL
Cholesterol: 171 mg/dL (ref 0–200)
HDL: 58.2 mg/dL (ref >39.00)
LDL Cholesterol: 104 mg/dL — ABNORMAL HIGH (ref 0–99)
NonHDL: 113.1
Total CHOL/HDL Ratio: 3
Triglycerides: 48 mg/dL (ref 0.0–149.0)
VLDL: 9.6 mg/dL (ref 0.0–40.0)

## 2015-03-09 LAB — TSH: TSH: 0.89 u[IU]/mL (ref 0.35–4.50)

## 2015-03-09 LAB — HEMOGLOBIN A1C: Hgb A1c MFr Bld: 5.7 % (ref 4.6–6.5)

## 2015-03-09 MED ORDER — PANTOPRAZOLE SODIUM 40 MG PO TBEC: 40.0000 mg | DELAYED_RELEASE_TABLET | Freq: Every day | ORAL | Status: DC

## 2015-03-09 NOTE — Telephone Encounter (Signed)
Called spoke with pt. Made aware can refill pantoprazole but needs OV prior to medication running out. Hydrocodone has been coming from Dr. Jenny Reichmann. This will need to come from them. Nothing further needed

## 2015-03-14 ENCOUNTER — Telehealth: Payer: Self-pay

## 2015-03-14 ENCOUNTER — Telehealth: Payer: Self-pay | Admitting: Internal Medicine

## 2015-03-14 NOTE — Telephone Encounter (Signed)
Please give pt a call regarding the labs she did a couple days ago

## 2015-03-14 NOTE — Telephone Encounter (Signed)
Returned patients phone call about lab results, unable to reach patient, left message to give [place] a call back.

## 2015-03-16 ENCOUNTER — Encounter: Payer: Self-pay | Admitting: Internal Medicine

## 2015-03-16 ENCOUNTER — Ambulatory Visit (INDEPENDENT_AMBULATORY_CARE_PROVIDER_SITE_OTHER): Payer: Self-pay | Admitting: Internal Medicine

## 2015-03-16 VITALS — BP 140/80 | HR 79 | Temp 98.1°F | Resp 20 | Ht 62.0 in | Wt 285.0 lb

## 2015-03-16 DIAGNOSIS — Z0001 Encounter for general adult medical examination with abnormal findings: Secondary | ICD-10-CM | POA: Insufficient documentation

## 2015-03-16 DIAGNOSIS — J3089 Other allergic rhinitis: Secondary | ICD-10-CM

## 2015-03-16 DIAGNOSIS — M25511 Pain in right shoulder: Secondary | ICD-10-CM

## 2015-03-16 DIAGNOSIS — J45909 Unspecified asthma, uncomplicated: Secondary | ICD-10-CM

## 2015-03-16 DIAGNOSIS — J4520 Mild intermittent asthma, uncomplicated: Secondary | ICD-10-CM

## 2015-03-16 DIAGNOSIS — R600 Localized edema: Secondary | ICD-10-CM | POA: Insufficient documentation

## 2015-03-16 DIAGNOSIS — Z0000 Encounter for general adult medical examination without abnormal findings: Secondary | ICD-10-CM

## 2015-03-16 DIAGNOSIS — R609 Edema, unspecified: Secondary | ICD-10-CM

## 2015-03-16 DIAGNOSIS — R6889 Other general symptoms and signs: Secondary | ICD-10-CM

## 2015-03-16 HISTORY — DX: Unspecified asthma, uncomplicated: J45.909

## 2015-03-16 MED ORDER — IBUPROFEN 600 MG PO TABS: 600.0000 mg | ORAL_TABLET | Freq: Four times a day (QID) | ORAL | Status: DC | PRN

## 2015-03-16 MED ORDER — PREDNISONE 10 MG PO TABS: ORAL_TABLET | ORAL | Status: DC

## 2015-03-16 MED ORDER — HYDROCODONE-ACETAMINOPHEN 7.5-325 MG PO TABS: ORAL_TABLET | ORAL | Status: DC

## 2015-03-16 MED ORDER — FLUTICASONE-SALMETEROL 250-50 MCG/DOSE IN AEPB: 1.0000 | INHALATION_SPRAY | Freq: Two times a day (BID) | RESPIRATORY_TRACT | Status: DC

## 2015-03-16 NOTE — Progress Notes (Signed)
Pre visit review using our clinic review tool, if applicable. No additional management support is needed unless otherwise documented below in the visit note. 

## 2015-03-16 NOTE — Assessment & Plan Note (Signed)
Suspect related to position and wt gain, ECG reviewed as per emr, declines echo for now, cont same tx

## 2015-03-16 NOTE — Assessment & Plan Note (Addendum)
Mild uncontrolled, for advair rx for pt to apply for patient assist  SpO2 Readings from Last 3 Encounters:  03/16/15 96%  10/04/14 97%  10/02/14 98%

## 2015-03-16 NOTE — Assessment & Plan Note (Signed)
Mod uncontrolled, cannot afford otc or there med now except for predpac asd

## 2015-03-16 NOTE — Assessment & Plan Note (Addendum)
Suspected frozen shoulder vs rot cuff tear, to cont f/u with sport med, or f/U with ortho as gets outstanding bill paid, ok for pain med refil

## 2015-03-16 NOTE — Patient Instructions (Signed)
Please take all new medication as prescribed - the prednisone  You are given the prescription for the advair  Please continue all other medications as before, and refills have been done if requested - the pain medication  Please have the pharmacy call with any other refills you may need.  Please continue your efforts at being more active, low cholesterol diet, and weight control.  You are otherwise up to date with prevention measures today.  Please keep your appointments with your specialists as you may have planned  Please return in 6 months, or sooner if needed

## 2015-03-16 NOTE — Assessment & Plan Note (Signed)

## 2015-03-16 NOTE — Progress Notes (Signed)
Subjective:    Patient ID: [name], female    DOB: 11-12-1960, 55 y.o.   MRN: [id number]  HPI  Here for wellness and f/u;  Overall doing ok;  Pt denies Chest pain, worsening SOB, DOE, wheezing, orthopnea, PND, palpitations, dizziness or syncope, except for mild worsening several months LE edema after sitting on a raised chair for 9 hrs per day.  Pt denies neurological change such as new headache, facial or extremity weakness.  Pt denies polydipsia, polyuria, or low sugar symptoms. Pt states overall good compliance with treatment and medications, good tolerability, and has been trying to follow appropriate diet.  Pt denies worsening depressive symptoms, suicidal ideation or panic. No fever, night sweats, wt loss, loss of appetite, or other constitutional symptoms.  Pt states good ability with ADL's, has low fall risk, home safety reviewed and adequate, no other significant changes in hearing or vision, and only occasionally active with exercise. Has gained significant wt Wt Readings from Last 3 Encounters:  03/16/15 285 lb (129.275 kg)  10/04/14 275 lb (124.739 kg)  10/02/14 275 lb (124.739 kg)  Also has persistent right shoulder pain and decraesd ROM, cannot see ortho due to outstanding bill, next new appt with sport medicine is April  Does have several wks ongoing nasal allergy symptoms with clearish congestion, itch and sneezing, without fever, pain, ST, cough, swelling or wheezing.but unable to take otc zyrtec and flonase due to cost.  Chronic pain overall stable, asks for refill Past Medical History  Diagnosis Date  . Hypertension   . Asthma   . Obesity (BMI 30-39.9)   . Alcohol abuse     abuse- moderate years ago - states only drinks now on weekends -2 to 8 driinks   . DJD (degenerative joint disease)     right knee, mot to severe  . Anxiety   . Panic   . COLONIC POLYPS, HX OF 04/05/2010  . DIVERTICULITIS, HX OF 04/05/2010  . PALPITATIONS, HX OF 09/14/2007  . Heart murmur     hx  of   . Depression 02/10/2011    20 yrs ago - pt states no longer a problem  . GERD (gastroesophageal reflux disease)     no meds  . Hyperlipidemia   . Impaired glucose tolerance 12/06/2013   Past Surgical History  Procedure Laterality Date  . Vesicovaginal fistula closure w/ tah    . Abdominal hysterectomy    . Knee arthroscopy      left   . Total knee arthroplasty  07/29/2011    Procedure: TOTAL KNEE ARTHROPLASTY;  Surgeon: Mauri Pole, MD;  Location: [facility];  Service: Orthopedics;  Laterality: Right;  . Total knee arthroplasty Left 12/14/2012    Procedure: LEFT TOTAL KNEE ARTHROPLASTY;  Surgeon: Mauri Pole, MD;  Location: [facility];  Service: Orthopedics;  Laterality: Left;    reports that she quit smoking about 27 years ago. Her smoking use included Cigarettes. She has a 3.5 pack-year smoking history. She has never used smokeless tobacco. She reports that she drinks alcohol. She reports that she does not use illicit drugs. family history includes COPD in her father; Lymphoma in her sister; Stroke in her mother. Allergies  Allergen Reactions  . Morphine Itching  . Shrimp [Shellfish Allergy] Itching  . Latex Rash   Current Outpatient Prescriptions on File Prior to Visit  Medication Sig Dispense Refill  . albuterol (PROAIR HFA) 108 (90 BASE) MCG/ACT inhaler Inhale 2 puffs into the lungs every 6 (  six) hours as needed for wheezing or shortness of breath. 1 Inhaler 11  . atorvastatin (LIPITOR) 10 MG tablet TAKE ONE TABLET BY MOUTH ONCE DAILY 30 tablet 5  . fluocinonide-emollient (LIDEX-E) 0.05 % cream Apply 1 application topically 2 (two) times daily. 30 g 0  . losartan-hydrochlorothiazide (HYZAAR) 100-25 MG tablet TAKE ONE TABLET BY MOUTH IN THE MORNING 30 tablet 5  . pantoprazole (PROTONIX) 40 MG tablet Take 1 tablet (40 mg total) by mouth daily. Take 30-60 min before first meal of the day 30 tablet 1  . potassium chloride (K-DUR) 10 MEQ tablet Take 1 tablet (10 mEq total) by mouth  daily. 90 tablet 3  . Sennosides (SENNA MAXIMUM STRENGTH) 25 MG TABS 1 tab by mouth at bedtime as needed 90 each 1   No current facility-administered medications on file prior to visit.   Review of Systems Constitutional: Negative for increased diaphoresis, other activity, appetite or siginficant weight change other than noted HENT: Negative for worsening hearing loss, ear pain, facial swelling, mouth sores and neck stiffness.   Eyes: Negative for other worsening pain, redness or visual disturbance.  Respiratory: Negative for shortness of breath and wheezing  Cardiovascular: Negative for chest pain and palpitations.  Gastrointestinal: Negative for diarrhea, blood in stool, abdominal distention or other pain Genitourinary: Negative for hematuria, flank pain or change in urine volume.  Musculoskeletal: Negative for myalgias or other joint complaints.  Skin: Negative for color change and wound or drainage.  Neurological: Negative for syncope and numbness. other than noted Hematological: Negative for adenopathy. or other swelling Psychiatric/Behavioral: Negative for hallucinations, SI, self-injury, decreased concentration or other worsening agitation.      Objective:   Physical Exam BP 140/80 mmHg  Pulse 79  Temp(Src) 98.1 F (36.7 C) (Oral)  Resp 20  Ht 5\' 2"  (1.575 m)  Wt 285 lb (129.275 kg)  BMI 52.11 kg/m2  SpO2 96% VS noted,  Constitutional: Pt is oriented to person, place, and time. Appears well-developed and well-nourished, in no significant distress Head: Normocephalic and atraumatic.  Right Ear: External ear normal.  Left Ear: External ear normal.  Nose: Nose normal.  Mouth/Throat: Oropharynx is clear and moist.  Eyes: Conjunctivae and EOM are normal. Pupils are equal, round, and reactive to light.  Neck: Normal range of motion. Neck supple. No JVD present. No tracheal deviation present or significant neck LA or mass Cardiovascular: Normal rate, regular rhythm, normal  heart sounds and intact distal pulses.   Pulmonary/Chest: Effort normal and breath sounds without rales or wheezing  Abdominal: Soft. Bowel sounds are normal. NT. No HSM  Musculoskeletal: Normal range of motion except right shoulder with marked decreased ROM in all directions with pain. Exhibits trace pedal edema.  Lymphadenopathy:  Has no cervical adenopathy.  Neurological: Pt is alert and oriented to person, place, and time. Pt has normal reflexes. No cranial nerve deficit. Motor grossly intact Skin: Skin is warm and dry. No rash noted.  Psychiatric:  Has normal mood and affect. Behavior is normal.     Assessment & Plan:

## 2015-03-17 NOTE — Assessment & Plan Note (Deleted)

## 2015-05-08 ENCOUNTER — Encounter (INDEPENDENT_AMBULATORY_CARE_PROVIDER_SITE_OTHER): Payer: Self-pay

## 2015-05-17 ENCOUNTER — Telehealth: Payer: Self-pay | Admitting: Internal Medicine

## 2015-05-17 NOTE — Telephone Encounter (Signed)
Patient Name: [name]  DOB: 08-13-1960    Initial Comment Caller states c/o productive cough, OTC meds aren't helping, requests Rx   Nurse Assessment  Nurse: Raphael Gibney, RN, Vanita Ingles Date/Time (Eastern Time): 05/17/2015 1:34:59 PM  Confirm and document reason for call. If symptomatic, describe symptoms. You must click the next button to save text entered. ---Caller states she has a productive cough and the OTC medications are not working. She is taking mucinex and she found some prednisone that she took. Symptoms started last last Friday. She is coughing up yellow sputum No fever.  Has the patient traveled out of the country within the last 30 days? ---No  Does the patient have any new or worsening symptoms? ---Yes  Will a triage be completed? ---Yes  Related visit to physician within the last 2 weeks? ---No  Does the PT have any chronic conditions? (i.e. diabetes, asthma, etc.) ---Yes  List chronic conditions. ---asthma  Is the patient pregnant or possibly pregnant? (Ask all females between the ages of 69-55) ---No  Is this a behavioral health or substance abuse call? ---No     Guidelines    Guideline Title Affirmed Question Affirmed Notes  Cough - Acute Productive SEVERE coughing spells (e.g., whooping sound after coughing, vomiting after coughing)    Final Disposition User   See Physician within 24 Hours Stringer, RN, Vera    Comments  Called secondary and primary numbers and left messages on both.  pt states she does not have any insurance and can not come to the office for appt. Wants to know if Dr Jenny Reichmann will call in antibiotics to the [facility] on [address] in [place]. Please call pt back and let her know about the medication.   Referrals  GO TO FACILITY REFUSED   Disagree/Comply: Disagree  Disagree/Comply Reason: Disagree with instructions

## 2015-05-18 ENCOUNTER — Ambulatory Visit (INDEPENDENT_AMBULATORY_CARE_PROVIDER_SITE_OTHER): Payer: Self-pay | Admitting: Internal Medicine

## 2015-05-18 ENCOUNTER — Encounter: Payer: Self-pay | Admitting: Internal Medicine

## 2015-05-18 VITALS — BP 140/80 | HR 106 | Temp 98.0°F | Resp 20 | Wt 287.0 lb

## 2015-05-18 DIAGNOSIS — Z0000 Encounter for general adult medical examination without abnormal findings: Secondary | ICD-10-CM

## 2015-05-18 DIAGNOSIS — J069 Acute upper respiratory infection, unspecified: Secondary | ICD-10-CM

## 2015-05-18 DIAGNOSIS — I10 Essential (primary) hypertension: Secondary | ICD-10-CM

## 2015-05-18 DIAGNOSIS — J45901 Unspecified asthma with (acute) exacerbation: Secondary | ICD-10-CM

## 2015-05-18 DIAGNOSIS — R7302 Impaired glucose tolerance (oral): Secondary | ICD-10-CM

## 2015-05-18 DIAGNOSIS — Z0189 Encounter for other specified special examinations: Secondary | ICD-10-CM

## 2015-05-18 MED ORDER — CEPHALEXIN 500 MG PO CAPS: 500.0000 mg | ORAL_CAPSULE | Freq: Four times a day (QID) | ORAL | Status: DC

## 2015-05-18 MED ORDER — HYDROCOD POLST-CPM POLST ER 10-8 MG/5ML PO SUER: 5.0000 mL | Freq: Two times a day (BID) | ORAL | Status: DC | PRN

## 2015-05-18 MED ORDER — PREDNISONE 10 MG PO TABS: ORAL_TABLET | ORAL | Status: DC

## 2015-05-18 NOTE — Progress Notes (Signed)
Pre visit review using our clinic review tool, if applicable. No additional management support is needed unless otherwise documented below in the visit note. 

## 2015-05-18 NOTE — Assessment & Plan Note (Signed)
stable overall by history and exam, recent data reviewed with pt, and pt to continue medical treatment as before,  to f/u any worsening symptoms or concerns BP Readings from Last 3 Encounters:  05/18/15 140/80  03/16/15 140/80  10/04/14 126/74

## 2015-05-18 NOTE — Progress Notes (Signed)
Subjective:    Patient ID: [name], female    DOB: 10/16/1960, 55 y.o.   MRN: [id number]  HPI   Here with 2-3 days acute onset fever, facial pain, pressure, headache, general weakness and malaise, and greenish d/c, with mild ST and cough, but pt denies chest pain, wheezing, increased sob or doe, orthopnea, PND, increased LE swelling, palpitations, dizziness or syncope, except for onset mild wheezing/sob since last PM.  Usually uses inhaler about once per day, today was twice already.  Wt Readings from Last 3 Encounters:  05/18/15 287 lb (130.182 kg)  03/16/15 285 lb (129.275 kg)  10/04/14 275 lb (124.739 kg)   Past Medical History  Diagnosis Date  . Hypertension   . Asthma   . Obesity (BMI 30-39.9)   . Alcohol abuse     abuse- moderate years ago - states only drinks now on weekends -2 to 8 driinks   . DJD (degenerative joint disease)     right knee, mot to severe  . Anxiety   . Panic   . COLONIC POLYPS, HX OF 04/05/2010  . DIVERTICULITIS, HX OF 04/05/2010  . PALPITATIONS, HX OF 09/14/2007  . Heart murmur     hx of   . Depression 02/10/2011    20 yrs ago - pt states no longer a problem  . GERD (gastroesophageal reflux disease)     no meds  . Hyperlipidemia   . Impaired glucose tolerance 12/06/2013   Past Surgical History  Procedure Laterality Date  . Vesicovaginal fistula closure w/ tah    . Abdominal hysterectomy    . Knee arthroscopy      left   . Total knee arthroplasty  07/29/2011    Procedure: TOTAL KNEE ARTHROPLASTY;  Surgeon: Mauri Pole, MD;  Location: [facility];  Service: Orthopedics;  Laterality: Right;  . Total knee arthroplasty Left 12/14/2012    Procedure: LEFT TOTAL KNEE ARTHROPLASTY;  Surgeon: Mauri Pole, MD;  Location: [facility];  Service: Orthopedics;  Laterality: Left;    reports that she quit smoking about 27 years ago. Her smoking use included Cigarettes. She has a 3.5 pack-year smoking history. She has never used smokeless tobacco. She reports that  she drinks alcohol. She reports that she does not use illicit drugs. family history includes COPD in her father; Lymphoma in her sister; Stroke in her mother. Allergies  Allergen Reactions  . Morphine Itching  . Shrimp [Shellfish Allergy] Itching  . Latex Rash   Current Outpatient Prescriptions on File Prior to Visit  Medication Sig Dispense Refill  . albuterol (PROAIR HFA) 108 (90 BASE) MCG/ACT inhaler Inhale 2 puffs into the lungs every 6 (six) hours as needed for wheezing or shortness of breath. 1 Inhaler 11  . atorvastatin (LIPITOR) 10 MG tablet TAKE ONE TABLET BY MOUTH ONCE DAILY 30 tablet 5  . fluocinonide-emollient (LIDEX-E) 0.05 % cream Apply 1 application topically 2 (two) times daily. 30 g 0  . Fluticasone-Salmeterol (ADVAIR DISKUS) 250-50 MCG/DOSE AEPB Inhale 1 puff into the lungs 2 (two) times daily. 3 each 3  . HYDROcodone-acetaminophen (NORCO) 7.5-325 MG tablet Take one tablet by mouth every 6 hrs as needed for pain 60 tablet 0  . ibuprofen (ADVIL,MOTRIN) 600 MG tablet Take 1 tablet (600 mg total) by mouth every 6 (six) hours as needed. 120 tablet 2  . losartan-hydrochlorothiazide (HYZAAR) 100-25 MG tablet TAKE ONE TABLET BY MOUTH IN THE MORNING 30 tablet 5  . pantoprazole (PROTONIX) 40 MG tablet Take  1 tablet (40 mg total) by mouth daily. Take 30-60 min before first meal of the day 30 tablet 1  . potassium chloride (K-DUR) 10 MEQ tablet Take 1 tablet (10 mEq total) by mouth daily. 90 tablet 3  . Sennosides (SENNA MAXIMUM STRENGTH) 25 MG TABS 1 tab by mouth at bedtime as needed 90 each 1   No current facility-administered medications on file prior to visit.   Review of Systems  Constitutional: Negative for unusual diaphoresis or night sweats HENT: Negative for ear swelling or discharge Eyes: Negative for worsening visual haziness  Respiratory: Negative for choking and stridor.   Gastrointestinal: Negative for distension or worsening eructation Genitourinary: Negative for  retention or change in urine volume.  Musculoskeletal: Negative for other MSK pain or swelling Skin: Negative for color change and worsening wound Neurological: Negative for tremors and numbness other than noted  Psychiatric/Behavioral: Negative for decreased concentration or agitation other than above       Objective:   Physical Exam BP 140/80 mmHg  Pulse 106  Temp(Src) 98 F (36.7 C) (Oral)  Resp 20  Wt 287 lb (130.182 kg)  SpO2 94% VS noted, mild ill Constitutional: Pt appears in no apparent distress HENT: Head: NCAT.  Right Ear: External ear normal.  Left Ear: External ear normal.  Eyes: . Pupils are equal, round, and reactive to light. Conjunctivae and EOM are normal Bilat tm's with mild erythema.  Max sinus areas mild tender.  Pharynx with mild erythema, no exudate Neck: Normal range of motion. Neck supple.  Cardiovascular: Normal rate and regular rhythm.   Pulmonary/Chest: Effort normal and breath sounds decresaed without rales but with few scattered wheezing.  Neurological: Pt is alert. Not confused , motor grossly intact Skin: Skin is warm. No rash, no LE edema Psychiatric: Pt behavior is normal. No agitation.     Assessment & Plan:

## 2015-05-18 NOTE — Telephone Encounter (Signed)
Patient calling in regards.  I have scheduled appointment for this morning.

## 2015-05-18 NOTE — Patient Instructions (Addendum)
Please take all new medication as prescribed - the antibiotic, cough medicine (tussionex), and prednisone  Please continue all other medications as before, and refills have been done if requested.  Please have the pharmacy call with any other refills you may need.  Please keep your appointments with your specialists as you may have planned  Please return in 6 months, or sooner if needed, with Lab testing done 3-5 days before

## 2015-05-18 NOTE — Assessment & Plan Note (Signed)
/  Mild to mod, for predpac asd,  to f/u any worsening symptoms or concerns 

## 2015-05-18 NOTE — Assessment & Plan Note (Signed)
stable overall by history and exam, recent data reviewed with pt, and pt to continue medical treatment as before,  to f/u any worsening symptoms or concerns Lab Results  Component Value Date   HGBA1C 5.7 03/09/2015   Pt to call for onset polys or cbg > 200 with illness and steroid tx

## 2015-05-18 NOTE — Assessment & Plan Note (Signed)
Mild to mod, for antibx course,  to f/u any worsening symptoms or concerns 

## 2015-06-15 ENCOUNTER — Telehealth: Payer: Self-pay | Admitting: Internal Medicine

## 2015-06-30 ENCOUNTER — Ambulatory Visit (HOSPITAL_COMMUNITY)
Admission: EM | Admit: 2015-06-30 | Discharge: 2015-06-30 | Disposition: A | Discharge status: Home / Self Care | Payer: Self-pay | Attending: Emergency Medicine | Admitting: Emergency Medicine

## 2015-06-30 ENCOUNTER — Encounter (HOSPITAL_COMMUNITY): Payer: Self-pay | Admitting: Emergency Medicine

## 2015-06-30 DIAGNOSIS — Z09 Encounter for follow-up examination after completed treatment for conditions other than malignant neoplasm: Secondary | ICD-10-CM

## 2015-06-30 DIAGNOSIS — R109 Unspecified abdominal pain: Secondary | ICD-10-CM

## 2015-06-30 NOTE — ED Notes (Signed)
Pt is here needing note to return to work... Reports she missed all this week due to fever, abd pain, diarrhea Reports she's been taking left over antibiotics and prednisone  A&O x4... No acute distress.

## 2015-06-30 NOTE — ED Provider Notes (Signed)
CSN: [id number]     Arrival date & time 06/30/15  1551 History   First MD Initiated Contact with Patient 06/30/15 1653     Chief Complaint  Patient presents with  . Medical Clearance   (Consider location/radiation/quality/duration/timing/severity/associated sxs/prior Treatment) HPI Comments: 55 year old female states that she is here for evaluation after being sick last week with GI symptoms and is requesting a note to go back to work. She states that she developed watery diarrhea, fever and abdominal cramping approximately 11 days ago. She has been out of work since then for the symptoms. She states she feels well now. She has been afebrile for a few days and having no abdominal symptoms. She states she feels energetic and healthy. Denies any sick symptoms.   Past Medical History  Diagnosis Date  . Hypertension   . Asthma   . Obesity (BMI 30-39.9)   . Alcohol abuse     abuse- moderate years ago - states only drinks now on weekends -2 to 8 driinks   . DJD (degenerative joint disease)     right knee, mot to severe  . Anxiety   . Panic   . COLONIC POLYPS, HX OF 04/05/2010  . DIVERTICULITIS, HX OF 04/05/2010  . PALPITATIONS, HX OF 09/14/2007  . Heart murmur     hx of   . Depression 02/10/2011    20 yrs ago - pt states no longer a problem  . GERD (gastroesophageal reflux disease)     no meds  . Hyperlipidemia   . Impaired glucose tolerance 12/06/2013   Past Surgical History  Procedure Laterality Date  . Vesicovaginal fistula closure w/ tah    . Abdominal hysterectomy    . Knee arthroscopy      left   . Total knee arthroplasty  07/29/2011    Procedure: TOTAL KNEE ARTHROPLASTY;  Surgeon: Mauri Pole, MD;  Location: [facility];  Service: Orthopedics;  Laterality: Right;  . Total knee arthroplasty Left 12/14/2012    Procedure: LEFT TOTAL KNEE ARTHROPLASTY;  Surgeon: Mauri Pole, MD;  Location: [facility];  Service: Orthopedics;  Laterality: Left;   Family History  Problem Relation Age  of Onset  . Lymphoma Sister   . Stroke Mother   . COPD Father    Social History  Substance Use Topics  . Smoking status: Former Smoker -- 0.50 packs/day for 7 years    Types: Cigarettes    Quit date: 02/11/1988  . Smokeless tobacco: Never Used  . Alcohol Use: Yes     Comment: occasionally   OB History    No data available     Review of Systems  Constitutional: Negative.   HENT: Negative.   Respiratory: Negative.   Genitourinary: Negative.   Musculoskeletal: Negative.   Skin: Negative.   Neurological: Negative.   All other systems reviewed and are negative.   Allergies  Morphine; Shrimp; and Latex  Home Medications   Prior to Admission medications   Medication Sig Start Date End Date Taking? Authorizing Provider  albuterol (PROAIR HFA) 108 (90 BASE) MCG/ACT inhaler Inhale 2 puffs into the lungs every 6 (six) hours as needed for wheezing or shortness of breath. 10/20/14  Yes Biagio Borg, MD  predniSONE (DELTASONE) 10 MG tablet 3 tabs by mouth per day for 3 days,2tabs per day for 3 days,1tab per day for 3 days 05/18/15  Yes Biagio Borg, MD  atorvastatin (LIPITOR) 10 MG tablet TAKE ONE TABLET BY MOUTH ONCE DAILY 12/15/14  Biagio Borg, MD  cephALEXin (KEFLEX) 500 MG capsule Take 1 capsule (500 mg total) by mouth 4 (four) times daily. 05/18/15   Biagio Borg, MD  chlorpheniramine-HYDROcodone [facility] PENNKINETIC ER) 10-8 MG/5ML SUER Take 5 mLs by mouth every 12 (twelve) hours as needed for cough. 05/18/15   Biagio Borg, MD  fluocinonide-emollient (LIDEX-E) 0.05 % cream Apply 1 application topically 2 (two) times daily. 12/27/14   Biagio Borg, MD  Fluticasone-Salmeterol (ADVAIR DISKUS) 250-50 MCG/DOSE AEPB Inhale 1 puff into the lungs 2 (two) times daily. 03/16/15   Biagio Borg, MD  HYDROcodone-acetaminophen [facility]) 7.5-325 MG tablet Take one tablet by mouth every 6 hrs as needed for pain 03/16/15   Biagio Borg, MD  ibuprofen (ADVIL,MOTRIN) 600 MG tablet Take 1 tablet (600 mg total)  by mouth every 6 (six) hours as needed. 03/16/15   Biagio Borg, MD  losartan-hydrochlorothiazide (HYZAAR) 100-25 MG tablet TAKE ONE TABLET BY MOUTH IN THE MORNING 12/15/14   Biagio Borg, MD  pantoprazole (PROTONIX) 40 MG tablet Take 1 tablet (40 mg total) by mouth daily. Take 30-60 min before first meal of the day 03/09/15   Tanda Rockers, MD  potassium chloride (K-DUR) 10 MEQ tablet Take 1 tablet (10 mEq total) by mouth daily. 10/04/14   Biagio Borg, MD  Sennosides [facility] MAXIMUM STRENGTH) 25 MG TABS 1 tab by mouth at bedtime as needed 04/14/14   Biagio Borg, MD   Meds Ordered and Administered this Visit  Medications - No data to display  BP 146/85 mmHg  Pulse 113  Temp(Src) 98 F (36.7 C) (Oral)  Resp 20  SpO2 96% No data found.   Physical Exam  Constitutional: She is oriented to person, place, and time. She appears well-developed and well-nourished. No distress.  Eyes: Conjunctivae and EOM are normal.  Neck: Normal range of motion. Neck supple.  Cardiovascular: Normal rate, regular rhythm and normal heart sounds.   Pulmonary/Chest: Effort normal and breath sounds normal. No respiratory distress. She has no wheezes. She has no rales.  Abdominal: Soft. Bowel sounds are normal. She exhibits no mass. There is no tenderness. There is no rebound and no guarding.  Obese  Musculoskeletal: She exhibits no edema.  Neurological: She is alert and oriented to person, place, and time. She exhibits normal muscle tone.  Skin: Skin is warm and dry.  Psychiatric: She has a normal mood and affect.  Nursing note and vitals reviewed.   ED Course  Procedures (including critical care time)  Labs Review Labs Reviewed - No data to display  Imaging Review No results found.   Visual Acuity Review  Right Eye Distance:   Left Eye Distance:   Bilateral Distance:    Right Eye Near:   Left Eye Near:    Bilateral Near:         MDM   1. Follow-up exam    Medical exam follow-up after what  appears to have been a gastro-enteritis. Denying symptoms at this time. Physical exam unremarkable. May return to work Monday   Janne Napoleon, NP 06/30/15 1733

## 2015-06-30 NOTE — Discharge Instructions (Signed)
Continue to drink fluids for rehydration. Physical exam unremarkable. History reveals no current symptoms. May return to work Monday.

## 2015-07-13 ENCOUNTER — Other Ambulatory Visit: Payer: Self-pay | Admitting: Internal Medicine

## 2015-07-16 ENCOUNTER — Other Ambulatory Visit: Payer: Self-pay | Admitting: Internal Medicine

## 2015-07-23 ENCOUNTER — Other Ambulatory Visit: Payer: Self-pay | Admitting: *Deleted

## 2015-07-23 MED ORDER — ATORVASTATIN CALCIUM 10 MG PO TABS: 10.0000 mg | ORAL_TABLET | Freq: Every day | ORAL | Status: DC

## 2015-07-23 MED ORDER — LOSARTAN POTASSIUM-HCTZ 100-25 MG PO TABS: 1.0000 | ORAL_TABLET | Freq: Every morning | ORAL | Status: DC

## 2015-07-23 NOTE — Telephone Encounter (Signed)
Receive call pt states she is needing refills on her Lipitor & Losartan. Had her cpx in Feb. Verified pharmacy inform will send to [place]...[name]

## 2015-08-16 MED ORDER — LOSARTAN POTASSIUM-HCTZ 100-25 MG PO TABS: 1.0000 | ORAL_TABLET | Freq: Every morning | ORAL | Status: DC

## 2015-08-16 MED ORDER — ATORVASTATIN CALCIUM 10 MG PO TABS: 10.0000 mg | ORAL_TABLET | Freq: Every day | ORAL | Status: DC

## 2015-08-16 NOTE — Addendum Note (Signed)
Addended by: Earnstine Regal on: 08/16/2015 01:23 PM   Modules accepted: Orders

## 2015-08-16 NOTE — Telephone Encounter (Signed)
Pt left msg on triage stating she is needing her Lipitor & Losartan. Per chart rx's was sent to [facility] on 6/12, will resend to [facility]...[name]

## 2015-08-24 ENCOUNTER — Telehealth: Payer: Self-pay | Admitting: Internal Medicine

## 2015-08-24 ENCOUNTER — Ambulatory Visit (HOSPITAL_COMMUNITY)
Admission: EM | Admit: 2015-08-24 | Discharge: 2015-08-24 | Disposition: A | Discharge status: Home / Self Care | Payer: Commercial Managed Care - PPO | Attending: Emergency Medicine | Admitting: Emergency Medicine

## 2015-08-24 ENCOUNTER — Encounter (HOSPITAL_COMMUNITY): Payer: Self-pay | Admitting: Family Medicine

## 2015-08-24 DIAGNOSIS — T700XXA Otitic barotrauma, initial encounter: Secondary | ICD-10-CM | POA: Diagnosis not present

## 2015-08-24 DIAGNOSIS — J0100 Acute maxillary sinusitis, unspecified: Secondary | ICD-10-CM

## 2015-08-24 DIAGNOSIS — R0982 Postnasal drip: Secondary | ICD-10-CM | POA: Diagnosis not present

## 2015-08-24 DIAGNOSIS — H6983 Other specified disorders of Eustachian tube, bilateral: Secondary | ICD-10-CM | POA: Diagnosis not present

## 2015-08-24 DIAGNOSIS — R42 Dizziness and giddiness: Secondary | ICD-10-CM

## 2015-08-24 MED ORDER — PREDNISONE 20 MG PO TABS: ORAL_TABLET | ORAL | Status: DC

## 2015-08-24 NOTE — ED Provider Notes (Signed)
CSN: [id number]     Arrival date & time 08/24/15  1537 History   First MD Initiated Contact with Patient 08/24/15 1652     Chief Complaint  Patient presents with  . Facial Pain  . Headache  . Dizziness   (Consider location/radiation/quality/duration/timing/severity/associated sxs/prior Treatment) HPI Comments: Pleasant 55 year old morbidly obese female complaining of lightheadedness for the past 4-5 days. She states she believes she may have sinus problem. She was taking some OTC sinus medications which helped her feel better for a few days.  She felt some pressure in her ears but no pain. She also felt pressure in her face. Denies problems with vision, speech, hearing, swallowing, shortness of breath, cough, chest pain.   Past Medical History  Diagnosis Date  . Hypertension   . Asthma   . Obesity (BMI 30-39.9)   . Alcohol abuse     abuse- moderate years ago - states only drinks now on weekends -2 to 8 driinks   . DJD (degenerative joint disease)     right knee, mot to severe  . Anxiety   . Panic   . COLONIC POLYPS, HX OF 04/05/2010  . DIVERTICULITIS, HX OF 04/05/2010  . PALPITATIONS, HX OF 09/14/2007  . Heart murmur     hx of   . Depression 02/10/2011    20 yrs ago - pt states no longer a problem  . GERD (gastroesophageal reflux disease)     no meds  . Hyperlipidemia   . Impaired glucose tolerance 12/06/2013   Past Surgical History  Procedure Laterality Date  . Vesicovaginal fistula closure w/ tah    . Abdominal hysterectomy    . Knee arthroscopy      left   . Total knee arthroplasty  07/29/2011    Procedure: TOTAL KNEE ARTHROPLASTY;  Surgeon: Mauri Pole, MD;  Location: [facility];  Service: Orthopedics;  Laterality: Right;  . Total knee arthroplasty Left 12/14/2012    Procedure: LEFT TOTAL KNEE ARTHROPLASTY;  Surgeon: Mauri Pole, MD;  Location: [facility];  Service: Orthopedics;  Laterality: Left;   Family History  Problem Relation Age of Onset  . Lymphoma Sister   .  Stroke Mother   . COPD Father    Social History  Substance Use Topics  . Smoking status: Former Smoker -- 0.50 packs/day for 7 years    Types: Cigarettes    Quit date: 02/11/1988  . Smokeless tobacco: Never Used  . Alcohol Use: Yes     Comment: occasionally   OB History    No data available     Review of Systems  Constitutional: Negative for fever, activity change, appetite change and fatigue.  HENT: Positive for sinus pressure. Negative for dental problem, hearing loss, rhinorrhea, sore throat and tinnitus.   Eyes: Negative.   Respiratory: Negative for cough, shortness of breath and wheezing.   Cardiovascular: Negative for chest pain and palpitations.  Gastrointestinal: Negative.   Genitourinary: Negative.   Musculoskeletal: Negative.   Skin: Negative for color change, pallor and rash.  Neurological: Positive for dizziness. Negative for tremors, seizures, syncope, facial asymmetry, speech difficulty, weakness and numbness.  All other systems reviewed and are negative.   Allergies  Morphine; Shrimp; and Latex  Home Medications   Prior to Admission medications   Medication Sig Start Date End Date Taking? Authorizing Provider  albuterol (PROAIR HFA) 108 (90 BASE) MCG/ACT inhaler Inhale 2 puffs into the lungs every 6 (six) hours as needed for wheezing or shortness of breath. 10/20/14  Biagio Borg, MD  atorvastatin (LIPITOR) 10 MG tablet Take 1 tablet (10 mg total) by mouth daily. 08/16/15   Biagio Borg, MD  cephALEXin (KEFLEX) 500 MG capsule Take 1 capsule (500 mg total) by mouth 4 (four) times daily. 05/18/15   Biagio Borg, MD  chlorpheniramine-HYDROcodone [facility] PENNKINETIC ER) 10-8 MG/5ML SUER Take 5 mLs by mouth every 12 (twelve) hours as needed for cough. 05/18/15   Biagio Borg, MD  fluocinonide-emollient (LIDEX-E) 0.05 % cream Apply 1 application topically 2 (two) times daily. 12/27/14   Biagio Borg, MD  Fluticasone-Salmeterol (ADVAIR DISKUS) 250-50 MCG/DOSE AEPB Inhale  1 puff into the lungs 2 (two) times daily. 03/16/15   Biagio Borg, MD  HYDROcodone-acetaminophen [facility]) 7.5-325 MG tablet Take one tablet by mouth every 6 hrs as needed for pain 03/16/15   Biagio Borg, MD  ibuprofen (ADVIL,MOTRIN) 600 MG tablet Take 1 tablet (600 mg total) by mouth every 6 (six) hours as needed. 03/16/15   Biagio Borg, MD  losartan-hydrochlorothiazide (HYZAAR) 100-25 MG tablet Take 1 tablet by mouth every morning. 08/16/15   Biagio Borg, MD  pantoprazole (PROTONIX) 40 MG tablet Take 1 tablet (40 mg total) by mouth daily. Take 30-60 min before first meal of the day 03/09/15   Tanda Rockers, MD  potassium chloride (K-DUR) 10 MEQ tablet Take 1 tablet (10 mEq total) by mouth daily. 10/04/14   Biagio Borg, MD  predniSONE (DELTASONE) 20 MG tablet Take 3 tabs po on first day, 2 tabs second day, 2 tabs third day, 1 tab fourth day, 1 tab 5th day. Take with food. 08/24/15   Janne Napoleon, NP  Sennosides [facility] MAXIMUM STRENGTH) 25 MG TABS 1 tab by mouth at bedtime as needed 04/14/14   Biagio Borg, MD   Meds Ordered and Administered this Visit  Medications - No data to display  BP 130/74 mmHg  Pulse 86  Temp(Src) 98.6 F (37 C)  Resp 18  SpO2 98% No data found.   Physical Exam  Constitutional: She is oriented to person, place, and time. She appears well-developed and well-nourished. No distress.  HENT:  Head: Normocephalic and atraumatic.  Mouth/Throat: No oropharyngeal exudate.  Bilateral TMs are pearly gray, translucent, retracted. No erythema or effusion.  Oropharynx with light clear PND and minor cobblestoning.  Eyes: Conjunctivae are normal.  Neck: Normal range of motion. Neck supple.  Cardiovascular: Normal rate, regular rhythm and normal heart sounds.   Pulmonary/Chest: Effort normal and breath sounds normal. No respiratory distress. She has no wheezes.  Musculoskeletal: Normal range of motion. She exhibits no edema.  Lymphadenopathy:    She has no cervical adenopathy.   Neurological: She is alert and oriented to person, place, and time.  Skin: Skin is warm and dry. No rash noted.  Psychiatric: She has a normal mood and affect.  Nursing note and vitals reviewed.   ED Course  Procedures (including critical care time)  Labs Review Labs Reviewed - No data to display  Imaging Review No results found.   Visual Acuity Review  Right Eye Distance:   Left Eye Distance:   Bilateral Distance:    Right Eye Near:   Left Eye Near:    Bilateral Near:         MDM   1. Barotitis media, initial encounter   2. ETD (eustachian tube dysfunction), bilateral   3. Dizziness   4. PND (post-nasal drip)   5. Acute maxillary sinusitis,  recurrence not specified     Meclizine 12.5 mg, one or 2 tabs every 6 to 8 hours as needed for dizziness. Continue taking your sinus medication that she brought in today. Also drink plenty of fluids and stay well-hydrated. Add the prednisone given to you was a prescription today. Read the instructions below to help you understand your diagnoses and any symptoms that may indicate something more severe.  Meds ordered this encounter  Medications  . predniSONE (DELTASONE) 20 MG tablet    Sig: Take 3 tabs po on first day, 2 tabs second day, 2 tabs third day, 1 tab fourth day, 1 tab 5th day. Take with food.    Dispense:  9 tablet    Refill:  0    Order Specific Question:  Supervising Provider    Answer:  Carmela Hurt       Janne Napoleon, NP 08/24/15 1820

## 2015-08-24 NOTE — ED Notes (Signed)
Per pt sts she has been dealing with sinus pain, headache, burning for about a week or more. Using OTC sinus meds. sts a little dizziness.

## 2015-08-24 NOTE — Telephone Encounter (Signed)
[facility] Patient Name: [name] DOB: [birth year] Initial Comment Caller states is having dizzy spells, no temp or upset stomach. Nurse Assessment Nurse: Martyn Ehrich RN, Felicia Date/Time (Eastern Time): 08/24/2015 3:05:21 PM Confirm and document reason for call. If symptomatic, describe symptoms. You must click the next button to save text entered. ---PT had dizziness later part of last week and took sinex for sinus pressure and that helped for a few d and she stopped taking it and dizzy again starting today. Dizziness is mild to moderate. Congestion behind eyes. No cough and no sneeze. Not congested other than behind eyes. No fever Has the patient traveled out of the country within the last 30 days? ---No Does the patient have any new or worsening symptoms? ---Yes Will a triage be completed? ---Yes Related visit to physician within the last 2 weeks? ---NoDoes the PT have any chronic conditions? (i.e. diabetes, asthma, etc.) ---Yes List chronic conditions. ---asthma Is the patient pregnant or possibly pregnant? (Ask all females between the ages of 48-55) ---No Is this a behavioral health or substance abuse call? ---No Guidelines Guideline Title Affirmed Question Affirmed Notes Dizziness - Lightheadedness [1] MODERATE dizziness (e.g., interferes with normal activities) AND [2] has NOT been evaluated by physician for this (Exception: dizziness caused by heat exposure, sudden standing, or poor fluid intake) Final Disposition User See Physician within 24 Hours Gaddy, RN, Felicia Comments HR 87 to 94, 30 min ago, and BP was 124/82 3 h ago (at drugstore)Referrals [place] Disagree/Comply: Comply Call Id: [id number]

## 2015-08-24 NOTE — Discharge Instructions (Signed)
Continue taking your sinus medication that she brought in today. Also drink plenty of fluids and stay well-hydrated. Add the prednisone given to you was a prescription today. Read the instructions below to help you understand your diagnoses and any symptoms that may indicate something more severe.   Barotitis Media Barotitis media is inflammation of your middle ear. This occurs when the auditory tube (eustachian tube) leading from the back of your nose (nasopharynx) to your eardrum is blocked. This blockage may result from a cold, environmental allergies, or an upper respiratory infection. Unresolved barotitis media may lead to damage or hearing loss (barotrauma), which may become permanent. HOME CARE INSTRUCTIONS   Use medicines as recommended by your health care provider. Over-the-counter medicines will help unblock the canal and can help during times of air travel.  Do not put anything into your ears to clean or unplug them. Eardrops will not be helpful.  Do not swim, dive, or fly until your health care provider says it is all right to do so. If these activities are necessary, chewing gum with frequent, forceful swallowing may help. It is also helpful to hold your nose and gently blow to pop your ears for equalizing pressure changes. This forces air into the eustachian tube.  Only take over-the-counter or prescription medicines for pain, discomfort, or fever as directed by your health care provider.  A decongestant may be helpful in decongesting the middle ear and make pressure equalization easier. SEEK MEDICAL CARE IF:  You experience a serious form of dizziness in which you feel as if the room is spinning and you feel nauseated (vertigo).  Your symptoms only involve one ear. SEEK IMMEDIATE MEDICAL CARE IF:   You develop a severe headache, dizziness, or severe ear pain.  You have bloody or pus-like drainage from your ears.  You develop a fever.  Your problems do not improve or  become worse. MAKE SURE YOU:   Understand these instructions.  Will watch your condition.  Will get help right away if you are not doing well or get worse.   This information is not intended to replace advice given to you by your health care provider. Make sure you discuss any questions you have with your health care provider.   Document Released: 01/25/2000 Document Revised: 11/17/2012 Document Reviewed: 08/24/2012 Elsevier Interactive Patient Education 2016 Elsevier Inc[employer]  Dizziness Meclizine 12.5 mg, one or 2 tabs every 6 to 8 hours as needed for dizziness. Dizziness is a common problem. It makes you feel unsteady or lightheaded. You may feel like you are about to pass out (faint). Dizziness can lead to injury if you stumble or fall. Anyone can get dizzy, but dizziness is more common in older adults. This condition can be caused by a number of things, including:  Medicines.  Dehydration.  Illness. HOME CARE Following these instructions may help with your condition: Eating and Drinking  Drink enough fluid to keep your pee (urine) clear or pale yellow. This helps to keep you from getting dehydrated. Try to drink more clear fluids, such as water.  Do not drink alcohol.  Limit how much caffeine you drink or eat if told by your doctor.  Limit how much salt you drink or eat if told by your doctor. Activity  Avoid making quick movements.  When you stand up from sitting in a chair, steady yourself until you feel okay.  In the morning, first sit up on the side of the bed. When you feel okay, stand slowly  while you hold onto something. Do this until you know that your balance is fine.  Move your legs often if you need to stand in one place for a long time. Tighten and relax your muscles in your legs while you are standing.  Do not drive or use heavy machinery if you feel dizzy.  Avoid bending down if you feel dizzy. Place items in your home so that they are easy for you to  reach without leaning over. Lifestyle  Do not use any tobacco products, including cigarettes, chewing tobacco, or electronic cigarettes. If you need help quitting, ask your doctor.  Try to lower your stress level, such as with yoga or meditation. Talk with your doctor if you need help. General Instructions  Watch your dizziness for any changes.  Take medicines only as told by your doctor. Talk with your doctor if you think that your dizziness is caused by a medicine that you are taking.  Tell a friend or a family member that you are feeling dizzy. If he or she notices any changes in your behavior, have this person call your doctor.  Keep all follow-up visits as told by your doctor. This is important. GET HELP IF:  Your dizziness does not go away.  Your dizziness or light-headedness gets worse.  You feel sick to your stomach (nauseous).  You have trouble hearing.  You have new symptoms.  You are unsteady on your feet or you feel like the room is spinning. GET HELP RIGHT AWAY IF:  You throw up (vomit) or have diarrhea and are unable to eat or drink anything.  You have trouble:  Talking.  Walking.  Swallowing.  Using your arms, hands, or legs.  You feel generally weak.  You are not thinking clearly or you have trouble forming sentences. It may take a friend or family member to notice this.  You have:  Chest pain.  Pain in your belly (abdomen).  Shortness of breath.  Sweating.  Your vision changes.  You are bleeding.  You have a headache.  You have neck pain or a stiff neck.  You have a fever.   This information is not intended to replace advice given to you by your health care provider. Make sure you discuss any questions you have with your health care provider.   Document Released: 01/16/2011 Document Revised: 06/13/2014 Document Reviewed: 01/23/2014 Elsevier Interactive Patient Education 2016 Elsevier Inc.  Sinusitis, Adult Sinusitis is redness,  soreness, and puffiness (inflammation) of the air pockets in the bones of your face (sinuses). The redness, soreness, and puffiness can cause air and mucus to get trapped in your sinuses. This can allow germs to grow and cause an infection.  HOME CARE   Drink enough fluids to keep your pee (urine) clear or pale yellow.  Use a humidifier in your home.  Run a hot shower to create steam in the bathroom. Sit in the bathroom with the door closed. Breathe in the steam 3-4 times a day.  Put a warm, moist washcloth on your face 3-4 times a day, or as told by your doctor.  Use salt water sprays (saline sprays) to wet the thick fluid in your nose. This can help the sinuses drain.  Only take medicine as told by your doctor. GET HELP RIGHT AWAY IF:   Your pain gets worse.  You have very bad headaches.  You are sick to your stomach (nauseous).  You throw up (vomit).  You are very sleepy (drowsy) all  the time.  Your face is puffy (swollen).  Your vision changes.  You have a stiff neck.  You have trouble breathing. MAKE SURE YOU:   Understand these instructions.  Will watch your condition.  Will get help right away if you are not doing well or get worse.   This information is not intended to replace advice given to you by your health care provider. Make sure you discuss any questions you have with your health care provider.   Document Released: 07/16/2007 Document Revised: 02/17/2014 Document Reviewed: 09/02/2011 Elsevier Interactive Patient Education [employer].

## 2015-09-03 ENCOUNTER — Telehealth: Payer: Self-pay | Admitting: Internal Medicine

## 2015-09-03 DIAGNOSIS — D179 Benign lipomatous neoplasm, unspecified: Secondary | ICD-10-CM

## 2015-09-03 NOTE — Telephone Encounter (Signed)
Patient is requesting that we refer her back to Erroll Luna, MD for lipoma prcedure. She states that she did not see him in the original referral due to insurance. Last visit in February 2017

## 2015-09-04 NOTE — Telephone Encounter (Signed)
Referral done

## 2015-09-07 ENCOUNTER — Telehealth: Payer: Self-pay | Admitting: Emergency Medicine

## 2015-09-07 NOTE — Telephone Encounter (Signed)
Pt wants to know if it will be ok if she takes a detox Tea called [place] with her health problems. Please follow up thanks.

## 2015-09-10 NOTE — Telephone Encounter (Signed)
Very sorry, but as this is not an approved FDA medication, I would not recommend, thanks

## 2015-09-10 NOTE — Telephone Encounter (Signed)
Left patient message to give [place] a call back

## 2015-10-10 ENCOUNTER — Other Ambulatory Visit: Payer: Self-pay | Admitting: Internal Medicine

## 2015-10-11 NOTE — Telephone Encounter (Signed)
Antibiotics are not normally refillable medications,  Please consider OV if thinks may need antibx tx.

## 2015-11-20 ENCOUNTER — Other Ambulatory Visit: Payer: Self-pay | Admitting: Internal Medicine

## 2015-11-20 DIAGNOSIS — Z1231 Encounter for screening mammogram for malignant neoplasm of breast: Secondary | ICD-10-CM

## 2015-11-23 ENCOUNTER — Ambulatory Visit
Admission: RE | Admit: 2015-11-23 | Discharge: 2015-11-23 | Disposition: A | Discharge status: Home / Self Care | Payer: Commercial Managed Care - PPO | Source: Ambulatory Visit | Attending: Internal Medicine | Admitting: Internal Medicine

## 2015-11-23 DIAGNOSIS — Z1231 Encounter for screening mammogram for malignant neoplasm of breast: Secondary | ICD-10-CM

## 2015-12-07 ENCOUNTER — Encounter: Payer: Self-pay | Admitting: Internal Medicine

## 2015-12-07 ENCOUNTER — Ambulatory Visit (INDEPENDENT_AMBULATORY_CARE_PROVIDER_SITE_OTHER): Payer: Commercial Managed Care - PPO | Admitting: Internal Medicine

## 2015-12-07 VITALS — BP 130/84 | HR 95 | Temp 98.2°F | Resp 20 | Wt 302.5 lb

## 2015-12-07 DIAGNOSIS — F411 Generalized anxiety disorder: Secondary | ICD-10-CM | POA: Diagnosis not present

## 2015-12-07 DIAGNOSIS — R7302 Impaired glucose tolerance (oral): Secondary | ICD-10-CM

## 2015-12-07 DIAGNOSIS — Z0001 Encounter for general adult medical examination with abnormal findings: Secondary | ICD-10-CM

## 2015-12-07 DIAGNOSIS — F32A Depression, unspecified: Secondary | ICD-10-CM

## 2015-12-07 DIAGNOSIS — J4520 Mild intermittent asthma, uncomplicated: Secondary | ICD-10-CM

## 2015-12-07 DIAGNOSIS — F329 Major depressive disorder, single episode, unspecified: Secondary | ICD-10-CM

## 2015-12-07 DIAGNOSIS — I10 Essential (primary) hypertension: Secondary | ICD-10-CM | POA: Diagnosis not present

## 2015-12-07 DIAGNOSIS — E6609 Other obesity due to excess calories: Secondary | ICD-10-CM

## 2015-12-07 MED ORDER — PHENTERMINE HCL 37.5 MG PO CAPS: 37.5000 mg | ORAL_CAPSULE | ORAL | 2 refills | Status: DC

## 2015-12-07 MED ORDER — LORAZEPAM 0.5 MG PO TABS
0.5000 mg | ORAL_TABLET | Freq: Two times a day (BID) | ORAL | 1 refills | Status: DC | PRN
Start: 2015-12-07 — End: 2016-07-25

## 2015-12-07 MED ORDER — FLUTICASONE-SALMETEROL 250-50 MCG/DOSE IN AEPB: 1.0000 | INHALATION_SPRAY | Freq: Two times a day (BID) | RESPIRATORY_TRACT | 3 refills | Status: DC

## 2015-12-07 MED ORDER — ALBUTEROL SULFATE HFA 108 (90 BASE) MCG/ACT IN AERS: 2.0000 | INHALATION_SPRAY | Freq: Four times a day (QID) | RESPIRATORY_TRACT | 11 refills | Status: DC | PRN

## 2015-12-07 MED ORDER — IBUPROFEN 800 MG PO TABS: 800.0000 mg | ORAL_TABLET | Freq: Three times a day (TID) | ORAL | 0 refills | Status: DC | PRN

## 2015-12-07 MED ORDER — ATORVASTATIN CALCIUM 10 MG PO TABS: 10.0000 mg | ORAL_TABLET | Freq: Every day | ORAL | 3 refills | Status: DC

## 2015-12-07 MED ORDER — IBUPROFEN 600 MG PO TABS: 600.0000 mg | ORAL_TABLET | Freq: Four times a day (QID) | ORAL | 2 refills | Status: DC | PRN

## 2015-12-07 MED ORDER — FLUOCINONIDE-E 0.05 % EX CREA: 1.0000 "application " | TOPICAL_CREAM | Freq: Two times a day (BID) | CUTANEOUS | 0 refills | Status: DC

## 2015-12-07 MED ORDER — TRAMADOL HCL 50 MG PO TABS: 50.0000 mg | ORAL_TABLET | Freq: Three times a day (TID) | ORAL | 2 refills | Status: DC | PRN

## 2015-12-07 MED ORDER — ASPIRIN EC 81 MG PO TBEC: 81.0000 mg | DELAYED_RELEASE_TABLET | Freq: Every day | ORAL | 11 refills | Status: DC

## 2015-12-07 MED ORDER — LOSARTAN POTASSIUM-HCTZ 100-25 MG PO TABS: 1.0000 | ORAL_TABLET | Freq: Every morning | ORAL | 3 refills | Status: DC

## 2015-12-07 NOTE — Patient Instructions (Addendum)
Please start the Aspirin 81 mg - 1 per day - coated only  Please take all new medication as prescribed - the ativan restart, and the phentermine for weight, and tramadol for pain  Please call if you change your mind about the Lexapro for depression  Please continue all other medications as before, and refills have been done if requested.  Please have the pharmacy call with any other refills you may need.  Please continue your efforts at being more active, low cholesterol diet, and weight control.  Please keep your appointments with your specialists as you may have planned  Please return in 6 months, or sooner if needed, with Lab testing done 3-5 days before

## 2015-12-07 NOTE — Progress Notes (Signed)
Pre visit review using our clinic review tool, if applicable. No additional management support is needed unless otherwise documented below in the visit note. 

## 2015-12-07 NOTE — Progress Notes (Signed)
   Subjective:    Patient ID: [name], female    DOB: Aug 25, 1960, 55 y.o.   MRN: [id number]  HPI  Here to f/u, has new insurance, wants all refills and lots of concerns.  Pt denies chest pain, increasing sob or doe, wheezing, orthopnea, PND, increased LE swelling, palpitations, dizziness or syncope.  Pt denies new neurological symptoms such as new headache, or facial or extremity weakness or numbness.  Pt denies polydipsia, polyuria, or low sugar episode.   Pt denies new neurological symptoms such as new headache, or facial or extremity weakness or numbness.   Pt states out of most meds, but little exercise however. Brother sick with ETOH, taking care of him, house in [place]clos[place], had to quit former job due to knee pain and now working different, less pay, and now more depressed, gained wt, and also for surgury for right leg lipoma soon.   Gained 15 lbs Sore all over/chronic pain uncontrolled, asks for pain control, has marked bilat knee pain with wt gain. Wt Readings from Last 3 Encounters:  12/07/15 (!) 302 lb 8 oz (137.2 kg)  05/18/15 287 lb (130.2 kg)  03/16/15 285 lb (129.3 kg)  not currently taking the asa 81.  .cphx No current outpatient prescriptions on file prior to visit.   No current facility-administered medications on file prior to visit.     Review of Systems  Constitutional: Negative for unusual diaphoresis or night sweats HENT: Negative for ear swelling or discharge Eyes: Negative for worsening visual haziness  Respiratory: Negative for choking and stridor.   Gastrointestinal: Negative for distension or worsening eructation Genitourinary: Negative for retention or change in urine volume.  Musculoskeletal: Negative for other MSK pain or swelling Skin: Negative for color change and worsening wound Neurological: Negative for tremors and numbness other than noted  Psychiatric/Behavioral: Negative for decreased concentration or agitation other than above   All o/w neg per  pt     Objective:   Physical Exam BP 130/84   Pulse 95   Temp 98.2 F (36.8 C) (Oral)   Resp 20   Wt (!) 302 lb 8 oz (137.2 kg)   SpO2 96%   BMI 55.33 kg/m  - supermorbid obese VS noted,  Constitutional: Pt appears in no apparent distress HENT: Head: NCAT.  Right Ear: External ear normal.  Left Ear: External ear normal.  Eyes: . Pupils are equal, round, and reactive to light. Conjunctivae and EOM are normal Neck: Normal range of motion. Neck supple.  Cardiovascular: Normal rate and regular rhythm.   Pulmonary/Chest: Effort normal and breath sounds without rales or wheezing.  Abd:  Soft, NT, ND, + BS Neurological: Pt is alert. Not confused , motor grossly intact MSK: bilat knee crepitus, NT, no effusions, FROM Skin: Skin is warm. No rash, trace chronic bilat LE edema Psychiatric: Pt behavior is normal. No agitation. 2-3+ nervous, + depressed affect    Assessment & Plan:

## 2015-12-08 NOTE — Assessment & Plan Note (Signed)
Ok for phentermine asd,  to f/u any worsening symptoms or concerns °

## 2015-12-08 NOTE — Assessment & Plan Note (Signed)
stable overall by history and exam, recent data reviewed with pt, and pt to continue medical treatment as before,  to f/u any worsening symptoms or concerns Lab Results  Component Value Date   HGBA1C 5.7 03/09/2015

## 2015-12-08 NOTE — Assessment & Plan Note (Signed)
Verified no SI or HI, declines lexapro trial or counseling referral

## 2015-12-08 NOTE — Assessment & Plan Note (Signed)
stable overall by history and exam, recent data reviewed with pt, and pt to continue medical treatment as before,  to f/u any worsening symptoms or concerns BP Readings from Last 3 Encounters:  12/07/15 130/84  08/24/15 130/74  06/30/15 146/85

## 2015-12-08 NOTE — Assessment & Plan Note (Addendum)
For ativan prn asd,  to f/u any worsening symptoms or concerns  Note:  Total time for pt hx, exam, review of record with pt in the room, determination of diagnoses and plan for further eval and tx is > 40 min, with over 50% spent in coordination and counseling of patient

## 2015-12-08 NOTE — Assessment & Plan Note (Signed)
stable overall by history and exam, recent data reviewed with pt, and pt to continue medical treatment as before,  to f/u any worsening symptoms or concerns, for med refills  

## 2015-12-12 ENCOUNTER — Other Ambulatory Visit: Payer: Self-pay | Admitting: General Surgery

## 2015-12-12 DIAGNOSIS — R2241 Localized swelling, mass and lump, right lower limb: Secondary | ICD-10-CM

## 2015-12-14 ENCOUNTER — Ambulatory Visit
Admission: RE | Admit: 2015-12-14 | Discharge: 2015-12-14 | Disposition: A | Discharge status: Home / Self Care | Payer: Commercial Managed Care - PPO | Source: Ambulatory Visit | Attending: General Surgery | Admitting: General Surgery

## 2015-12-14 DIAGNOSIS — R2241 Localized swelling, mass and lump, right lower limb: Secondary | ICD-10-CM

## 2015-12-14 MED ORDER — IOPAMIDOL (ISOVUE-300) INJECTION 61%
100.0000 mL | Freq: Once | INTRAVENOUS | Status: AC | PRN
  Administered 2015-12-14: 100 mL via INTRAVENOUS

## 2015-12-18 ENCOUNTER — Ambulatory Visit: Payer: Self-pay | Admitting: General Surgery

## 2016-01-25 ENCOUNTER — Encounter: Payer: Self-pay | Admitting: Internal Medicine

## 2016-01-25 ENCOUNTER — Ambulatory Visit (INDEPENDENT_AMBULATORY_CARE_PROVIDER_SITE_OTHER): Payer: Commercial Managed Care - PPO | Admitting: Internal Medicine

## 2016-01-25 VITALS — BP 138/78 | HR 98 | Temp 98.1°F | Resp 20 | Wt 302.0 lb

## 2016-01-25 DIAGNOSIS — H6990 Unspecified Eustachian tube disorder, unspecified ear: Secondary | ICD-10-CM | POA: Insufficient documentation

## 2016-01-25 DIAGNOSIS — H6993 Unspecified Eustachian tube disorder, bilateral: Secondary | ICD-10-CM

## 2016-01-25 DIAGNOSIS — J309 Allergic rhinitis, unspecified: Secondary | ICD-10-CM

## 2016-01-25 DIAGNOSIS — J019 Acute sinusitis, unspecified: Secondary | ICD-10-CM | POA: Diagnosis not present

## 2016-01-25 DIAGNOSIS — J45909 Unspecified asthma, uncomplicated: Secondary | ICD-10-CM

## 2016-01-25 DIAGNOSIS — I10 Essential (primary) hypertension: Secondary | ICD-10-CM

## 2016-01-25 MED ORDER — CETIRIZINE HCL 10 MG PO TABS: 10.0000 mg | ORAL_TABLET | Freq: Every day | ORAL | 11 refills | Status: DC

## 2016-01-25 MED ORDER — HYDROCODONE-HOMATROPINE 5-1.5 MG/5ML PO SYRP: 5.0000 mL | ORAL_SOLUTION | Freq: Four times a day (QID) | ORAL | 0 refills | Status: AC | PRN

## 2016-01-25 MED ORDER — LEVOFLOXACIN 500 MG PO TABS: 500.0000 mg | ORAL_TABLET | Freq: Every day | ORAL | 0 refills | Status: AC

## 2016-01-25 NOTE — Patient Instructions (Signed)
Please take all new medication as prescribed - the antibiotic for infection, and the zyrtec for allergies,  And the cough medicine if needed  You can also take Mucinex (or it's generic off brand) for congestion, and tylenol as needed for pain.  Please continue all other medications as before, and refills have been done if requested.  Please have the pharmacy call with any other refills you may need.  Please keep your appointments with your specialists as you may have planned

## 2016-01-25 NOTE — Assessment & Plan Note (Signed)
stable overall by history and exam, recent data reviewed with pt, and pt to continue medical treatment as before,  to f/u any worsening symptoms or concerns BP Readings from Last 3 Encounters:  01/25/16 138/78  12/07/15 130/84  08/24/15 130/74

## 2016-01-25 NOTE — Assessment & Plan Note (Signed)
Mild to mod, for zyrtec prn,  to f/u any worsening symptoms or concerns 

## 2016-01-25 NOTE — Assessment & Plan Note (Signed)
/  Mild, for mucinex otc prn,  to f/u any worsening symptoms or concerns 

## 2016-01-25 NOTE — Progress Notes (Signed)
Pre visit review using our clinic review tool, if applicable. No additional management support is needed unless otherwise documented below in the visit note. 

## 2016-01-25 NOTE — Assessment & Plan Note (Signed)
stable overall by history and exam, recent data reviewed with pt, and pt to continue medical treatment as before,  to f/u any worsening symptoms or concerns @LASTSAO2(3)@  

## 2016-01-25 NOTE — Assessment & Plan Note (Signed)
Mild to mod, for antibx course,  to f/u any worsening symptoms or concerns 

## 2016-01-25 NOTE — Progress Notes (Signed)
Subjective:    Patient ID: [name], female    DOB: Mar 18, 1960, 55 y.o.   MRN: [id number]  HPI   Here with 2-3 days acute onset fever, facial pain, pressure, headache, general weakness and malaise, and greenish d/c, with mild ST and cough, but pt denies chest pain, wheezing, increased sob or doe, orthopnea, PND, increased LE swelling, palpitations, dizziness or syncope. Also with bilat ear popping and crackling, feeling of fullness without tinnitus or hearing loss.  Does also have several wks ongoing nasal allergy symptoms with clearish congestion, itch and sneezing, without fever, pain, ST, cough, swelling or wheezing.  No other new history Past Medical History:  Diagnosis Date  . Alcohol abuse    abuse- moderate years ago - states only drinks now on weekends -2 to 8 driinks   . Anxiety   . Asthma   . COLONIC POLYPS, HX OF 04/05/2010  . Depression 02/10/2011   20 yrs ago - pt states no longer a problem  . DIVERTICULITIS, HX OF 04/05/2010  . DJD (degenerative joint disease)    right knee, mot to severe  . GERD (gastroesophageal reflux disease)    no meds  . Heart murmur    hx of   . Hyperlipidemia   . Hypertension   . Impaired glucose tolerance 12/06/2013  . Obesity (BMI 30-39.9)   . PALPITATIONS, HX OF 09/14/2007  . Panic    Past Surgical History:  Procedure Laterality Date  . ABDOMINAL HYSTERECTOMY    . KNEE ARTHROSCOPY     left   . TOTAL KNEE ARTHROPLASTY  07/29/2011   Procedure: TOTAL KNEE ARTHROPLASTY;  Surgeon: Mauri Pole, MD;  Location: [facility];  Service: Orthopedics;  Laterality: Right;  . TOTAL KNEE ARTHROPLASTY Left 12/14/2012   Procedure: LEFT TOTAL KNEE ARTHROPLASTY;  Surgeon: Mauri Pole, MD;  Location: [facility];  Service: Orthopedics;  Laterality: Left;  Marland Kitchen VESICOVAGINAL FISTULA CLOSURE W/ TAH      reports that she quit smoking about 27 years ago. Her smoking use included Cigarettes. She has a 3.50 pack-year smoking history. She has never used smokeless  tobacco. She reports that she drinks alcohol. She reports that she does not use drugs. family history includes COPD in her father; Lymphoma in her sister; Stroke in her mother. Allergies  Allergen Reactions  . Morphine Itching  . Shrimp [Shellfish Allergy] Itching  . Latex Rash   Current Outpatient Prescriptions on File Prior to Visit  Medication Sig Dispense Refill  . albuterol (PROAIR HFA) 108 (90 Base) MCG/ACT inhaler Inhale 2 puffs into the lungs every 6 (six) hours as needed for wheezing or shortness of breath. 1 Inhaler 11  . aspirin EC 81 MG tablet Take 1 tablet (81 mg total) by mouth daily. 90 tablet 11  . atorvastatin (LIPITOR) 10 MG tablet Take 1 tablet (10 mg total) by mouth daily. 90 tablet 3  . fluocinonide-emollient (LIDEX-E) 0.05 % cream Apply 1 application topically 2 (two) times daily. 30 g 0  . Fluticasone-Salmeterol (ADVAIR DISKUS) 250-50 MCG/DOSE AEPB Inhale 1 puff into the lungs 2 (two) times daily. 3 each 3  . ibuprofen (ADVIL,MOTRIN) 800 MG tablet Take 1 tablet (800 mg total) by mouth every 8 (eight) hours as needed. 30 tablet 0  . LORazepam (ATIVAN) 0.5 MG tablet Take 1 tablet (0.5 mg total) by mouth 2 (two) times daily as needed for anxiety. 60 tablet 1  . losartan-hydrochlorothiazide (HYZAAR) 100-25 MG tablet Take 1 tablet by mouth every  morning. 90 tablet 3  . phentermine 37.5 MG capsule Take 1 capsule (37.5 mg total) by mouth every morning. 30 capsule 2  . traMADol (ULTRAM) 50 MG tablet Take 1 tablet (50 mg total) by mouth every 8 (eight) hours as needed. 90 tablet 2   No current facility-administered medications on file prior to visit.    Review of Systems  Constitutional: Negative for unusual diaphoresis or night sweats HENT: Negative for ear swelling or discharge Eyes: Negative for worsening visual haziness  Respiratory: Negative for choking and stridor.   Gastrointestinal: Negative for distension or worsening eructation Genitourinary: Negative for  retention or change in urine volume.  Musculoskeletal: Negative for other MSK pain or swelling Skin: Negative for color change and worsening wound Neurological: Negative for tremors and numbness other than noted  Psychiatric/Behavioral: Negative for decreased concentration or agitation other than above   All other system neg    Objective:   Physical Exam BP 138/78   Pulse 98   Temp 98.1 F (36.7 C) (Oral)   Resp 20   Wt (!) 302 lb (137 kg)   SpO2 99%   BMI 55.24 kg/m  VS noted, mild ill Constitutional: Pt appears in no apparent distress HENT: Head: NCAT.  Right Ear: External ear normal.  Left Ear: External ear normal.  Bilat tm's with mild erythema.  Max sinus areas mild tender.  Pharynx with mild erythema, no exudate Eyes: . Pupils are equal, round, and reactive to light. Conjunctivae and EOM are normal Neck: Normal range of motion. Neck supple.  Cardiovascular: Normal rate and regular rhythm.   Pulmonary/Chest: Effort normal and breath sounds without rales or wheezing.  Neurological: Pt is alert. Not confused , motor grossly intact Skin: Skin is warm. No rash, no LE edema Psychiatric: Pt behavior is normal. No agitation.  No other new exam findings       Assessment & Plan:

## 2016-02-15 ENCOUNTER — Encounter: Payer: Self-pay | Admitting: Podiatry

## 2016-02-15 ENCOUNTER — Ambulatory Visit (INDEPENDENT_AMBULATORY_CARE_PROVIDER_SITE_OTHER): Payer: Commercial Managed Care - PPO | Admitting: Podiatry

## 2016-02-15 VITALS — BP 126/82 | HR 107 | Resp 16 | Ht 61.5 in | Wt 298.0 lb

## 2016-02-15 DIAGNOSIS — M722 Plantar fascial fibromatosis: Secondary | ICD-10-CM | POA: Diagnosis not present

## 2016-02-15 DIAGNOSIS — B351 Tinea unguium: Secondary | ICD-10-CM

## 2016-02-15 DIAGNOSIS — M79605 Pain in left leg: Secondary | ICD-10-CM | POA: Diagnosis not present

## 2016-02-15 DIAGNOSIS — M79604 Pain in right leg: Secondary | ICD-10-CM | POA: Diagnosis not present

## 2016-02-15 MED ORDER — TERBINAFINE HCL 250 MG PO TABS: 250.0000 mg | ORAL_TABLET | Freq: Every day | ORAL | 0 refills | Status: DC

## 2016-02-15 MED ORDER — TRIAMCINOLONE ACETONIDE 10 MG/ML IJ SUSP
10.0000 mg | Freq: Once | INTRAMUSCULAR | Status: AC
  Administered 2016-02-15: 10 mg

## 2016-02-15 NOTE — Progress Notes (Signed)
   Subjective:    Patient ID: [name], female    DOB: [birth year], 56 y.o.   MRN: [id number]  HPI Chief Complaint  Patient presents with  . Nail Problem    Bilateral; all toes; nail discoloration & thickened nails; pt stated, "Has used Lamisil 6 months ago with no relief and OTC foot fungus with no relief"  . Foot Pain    Bilateral; heel; pt stated, "Has had pain on and off for the past year"      Review of Systems  All other systems reviewed and are negative.      Objective:   Physical Exam        Assessment & Plan:

## 2016-02-15 NOTE — Patient Instructions (Signed)

## 2016-02-18 NOTE — Progress Notes (Signed)
Subjective:     Patient ID: [name], female   DOB: 05-Dec-1960, 56 y.o.   MRN: [id number]  HPI patient presents with extremely thick nailbeds bilateral and also pain in the heels of both feet that she states is been present for a fairly long period of time patient states that she's tried shoe gear modifications and other modalities without relief of symptoms   Review of Systems  All other systems reviewed and are negative.      Objective:   Physical Exam  Constitutional: She is oriented to person, place, and time.  Cardiovascular: Intact distal pulses.   Musculoskeletal: Normal range of motion.  Neurological: She is oriented to person, place, and time.  Skin: Skin is warm.  Nursing note and vitals reviewed.  neurovascular status intact muscle strength adequate range of motion within normal limits with patient found to have exquisite discomfort plantar aspect heel region bilateral with fluid buildup. Patient's also noted to have nail disease 1-5 both feet that are yellow thick and brittle and she cannot cut them herself and they can become moderately painful at times. Patient has good digital perfusion and is well oriented 3     Assessment:     Mycotic nail infection 1-5 both feet with pain in the plantar heel region bilateral with fluid buildup noted around the medial band    Plan:     H&P and both conditions reviewed. Today I did go ahead and focused on the heels and I reviewed x-rays and then injected the plantar fascia bilateral 3 mg Kenalog 5 mg Xylocaine and debrided all nailbeds 1-5 both feet with no iatrogenic bleeding noted. Patient be seen back to recheck  X-ray report indicated that there is spur formation with no indications of stress fracture advanced arthritis

## 2016-03-14 ENCOUNTER — Ambulatory Visit: Payer: Commercial Managed Care - PPO | Admitting: Podiatry

## 2016-03-20 ENCOUNTER — Encounter: Payer: Commercial Managed Care - PPO | Admitting: Internal Medicine

## 2016-04-07 ENCOUNTER — Other Ambulatory Visit: Payer: Self-pay | Admitting: *Deleted

## 2016-04-07 MED ORDER — FLUTICASONE-SALMETEROL 250-50 MCG/DOSE IN AEPB: 1.0000 | INHALATION_SPRAY | Freq: Two times a day (BID) | RESPIRATORY_TRACT | 2 refills | Status: DC

## 2016-04-07 NOTE — Telephone Encounter (Signed)
Left msg on triage stating its time to reorder her advair for the pt assisting program. Need rx faxed to [phone]. Printed script & fax to # given...[name]

## 2016-04-17 ENCOUNTER — Other Ambulatory Visit (INDEPENDENT_AMBULATORY_CARE_PROVIDER_SITE_OTHER): Payer: Commercial Managed Care - PPO

## 2016-04-17 ENCOUNTER — Other Ambulatory Visit: Payer: Commercial Managed Care - PPO

## 2016-04-17 DIAGNOSIS — Z0001 Encounter for general adult medical examination with abnormal findings: Secondary | ICD-10-CM

## 2016-04-17 DIAGNOSIS — R7302 Impaired glucose tolerance (oral): Secondary | ICD-10-CM | POA: Diagnosis not present

## 2016-04-17 LAB — URINALYSIS, ROUTINE W REFLEX MICROSCOPIC
Bilirubin Urine: NEGATIVE
Ketones, ur: NEGATIVE
Leukocytes, UA: NEGATIVE
Nitrite: NEGATIVE
Specific Gravity, Urine: >=1.03 — AB (ref 1.000–1.030)
Urine Glucose: NEGATIVE
Urobilinogen, UA: 0.2 (ref 0.0–1.0)
pH: 5 (ref 5.0–8.0)

## 2016-04-17 LAB — CBC WITH DIFFERENTIAL/PLATELET
Basophils Absolute: 0.1 10*3/uL (ref 0.0–0.1)
Basophils Relative: 1.2 % (ref 0.0–3.0)
Eosinophils Absolute: 0.1 10*3/uL (ref 0.0–0.7)
Eosinophils Relative: 0.8 % (ref 0.0–5.0)
HCT: 43.6 % (ref 36.0–46.0)
Hemoglobin: 14.3 g/dL (ref 12.0–15.0)
Lymphocytes Relative: 20.1 % (ref 12.0–46.0)
Lymphs Abs: 2.1 10*3/uL (ref 0.7–4.0)
MCHC: 32.8 g/dL (ref 30.0–36.0)
MCV: 87.9 fl (ref 78.0–100.0)
Monocytes Absolute: 0.8 10*3/uL (ref 0.1–1.0)
Monocytes Relative: 7.8 % (ref 3.0–12.0)
Neutro Abs: 7.5 10*3/uL (ref 1.4–7.7)
Neutrophils Relative %: 70.1 % (ref 43.0–77.0)
Platelets: 278 10*3/uL (ref 150.0–400.0)
RBC: 4.97 Mil/uL (ref 3.87–5.11)
RDW: 14.8 % (ref 11.5–15.5)
WBC: 10.7 10*3/uL — ABNORMAL HIGH (ref 4.0–10.5)

## 2016-04-17 LAB — BASIC METABOLIC PANEL
BUN: 17 mg/dL (ref 6–23)
CO2: 26 mEq/L (ref 19–32)
Calcium: 9.9 mg/dL (ref 8.4–10.5)
Chloride: 102 mEq/L (ref 96–112)
Creatinine, Ser: 0.96 mg/dL (ref 0.40–1.20)
GFR: 77.59 mL/min (ref >60.00)
Glucose, Bld: 96 mg/dL (ref 70–99)
Potassium: 3.5 mEq/L (ref 3.5–5.1)
Sodium: 138 mEq/L (ref 135–145)

## 2016-04-17 LAB — HEMOGLOBIN A1C: Hgb A1c MFr Bld: 5.9 % (ref 4.6–6.5)

## 2016-04-17 LAB — TSH: TSH: 2.35 u[IU]/mL (ref 0.35–4.50)

## 2016-04-18 ENCOUNTER — Emergency Department (HOSPITAL_COMMUNITY)
Admission: EM | Admit: 2016-04-18 | Discharge: 2016-04-18 | Disposition: A | Discharge status: Home / Self Care | Payer: Commercial Managed Care - PPO | Attending: Emergency Medicine | Admitting: Emergency Medicine

## 2016-04-18 ENCOUNTER — Encounter (HOSPITAL_COMMUNITY): Payer: Self-pay

## 2016-04-18 ENCOUNTER — Encounter: Payer: Commercial Managed Care - PPO | Admitting: Internal Medicine

## 2016-04-18 ENCOUNTER — Emergency Department (HOSPITAL_COMMUNITY): Payer: Commercial Managed Care - PPO

## 2016-04-18 DIAGNOSIS — Z87891 Personal history of nicotine dependence: Secondary | ICD-10-CM | POA: Diagnosis not present

## 2016-04-18 DIAGNOSIS — J40 Bronchitis, not specified as acute or chronic: Secondary | ICD-10-CM | POA: Insufficient documentation

## 2016-04-18 DIAGNOSIS — R05 Cough: Secondary | ICD-10-CM | POA: Insufficient documentation

## 2016-04-18 DIAGNOSIS — R062 Wheezing: Secondary | ICD-10-CM | POA: Insufficient documentation

## 2016-04-18 DIAGNOSIS — Z7982 Long term (current) use of aspirin: Secondary | ICD-10-CM | POA: Diagnosis not present

## 2016-04-18 DIAGNOSIS — R0602 Shortness of breath: Secondary | ICD-10-CM | POA: Diagnosis present

## 2016-04-18 DIAGNOSIS — I10 Essential (primary) hypertension: Secondary | ICD-10-CM | POA: Diagnosis not present

## 2016-04-18 DIAGNOSIS — R059 Cough, unspecified: Secondary | ICD-10-CM

## 2016-04-18 LAB — HEPATIC FUNCTION PANEL
ALT: 22 U/L (ref 0–35)
AST: 30 U/L (ref 0–37)
Albumin: 4 g/dL (ref 3.5–5.2)
Alkaline Phosphatase: 104 U/L (ref 39–117)
Bilirubin, Direct: 0.1 mg/dL (ref 0.0–0.3)
Total Bilirubin: 0.3 mg/dL (ref 0.2–1.2)
Total Protein: 7.8 g/dL (ref 6.0–8.3)

## 2016-04-18 LAB — CBC
HCT: 42.7 % (ref 36.0–46.0)
Hemoglobin: 13.9 g/dL (ref 12.0–15.0)
MCH: 29 pg (ref 26.0–34.0)
MCHC: 32.6 g/dL (ref 30.0–36.0)
MCV: 89.1 fL (ref 78.0–100.0)
Platelets: 212 10*3/uL (ref 150–400)
RBC: 4.79 MIL/uL (ref 3.87–5.11)
RDW: 14.9 % (ref 11.5–15.5)
WBC: 9.3 10*3/uL (ref 4.0–10.5)

## 2016-04-18 LAB — LIPID PANEL
Cholesterol: 208 mg/dL — ABNORMAL HIGH (ref 0–200)
HDL: 76.7 mg/dL (ref >39.00)
LDL Cholesterol: 115 mg/dL — ABNORMAL HIGH (ref 0–99)
NonHDL: 131.34
Total CHOL/HDL Ratio: 3
Triglycerides: 83 mg/dL (ref 0.0–149.0)
VLDL: 16.6 mg/dL (ref 0.0–40.0)

## 2016-04-18 LAB — BASIC METABOLIC PANEL
Anion gap: 12 (ref 5–15)
BUN: 13 mg/dL (ref 6–20)
CO2: 25 mmol/L (ref 22–32)
Calcium: 9.2 mg/dL (ref 8.9–10.3)
Chloride: 98 mmol/L — ABNORMAL LOW (ref 101–111)
Creatinine, Ser: 0.96 mg/dL (ref 0.44–1.00)
GFR calc Af Amer: >60 mL/min (ref >60)
GFR calc non Af Amer: >60 mL/min (ref >60)
Glucose, Bld: 102 mg/dL — ABNORMAL HIGH (ref 65–99)
Potassium: 3.3 mmol/L — ABNORMAL LOW (ref 3.5–5.1)
Sodium: 135 mmol/L (ref 135–145)

## 2016-04-18 LAB — I-STAT TROPONIN, ED: Troponin i, poc: 0 ng/mL (ref 0.00–0.08)

## 2016-04-18 LAB — BRAIN NATRIURETIC PEPTIDE: B Natriuretic Peptide: 27 pg/mL (ref 0.0–100.0)

## 2016-04-18 MED ORDER — ALBUTEROL SULFATE (2.5 MG/3ML) 0.083% IN NEBU
5.0000 mg | INHALATION_SOLUTION | Freq: Once | RESPIRATORY_TRACT | Status: AC
  Administered 2016-04-18: 5 mg via RESPIRATORY_TRACT
  Filled 2016-04-18: qty 6

## 2016-04-18 MED ORDER — BENZONATATE 100 MG PO CAPS: 100.0000 mg | ORAL_CAPSULE | Freq: Three times a day (TID) | ORAL | 0 refills | Status: DC

## 2016-04-18 MED ORDER — PREDNISONE 20 MG PO TABS: ORAL_TABLET | ORAL | 0 refills | Status: DC

## 2016-04-18 MED ORDER — IPRATROPIUM BROMIDE 0.02 % IN SOLN
0.5000 mg | Freq: Once | RESPIRATORY_TRACT | Status: AC
  Administered 2016-04-18: 0.5 mg via RESPIRATORY_TRACT
  Filled 2016-04-18: qty 2.5

## 2016-04-18 MED ORDER — ALBUTEROL SULFATE HFA 108 (90 BASE) MCG/ACT IN AERS: 1.0000 | INHALATION_SPRAY | Freq: Four times a day (QID) | RESPIRATORY_TRACT | 0 refills | Status: DC | PRN

## 2016-04-18 MED ORDER — PREDNISONE 20 MG PO TABS
60.0000 mg | ORAL_TABLET | Freq: Once | ORAL | Status: AC
  Administered 2016-04-18: 60 mg via ORAL
  Filled 2016-04-18: qty 3

## 2016-04-18 NOTE — ED Provider Notes (Signed)
[facility] DEPT Provider Note   CSN: [id number] Arrival date & time: 04/18/16  1142     History   Chief Complaint Chief Complaint  Patient presents with  . Shortness of Breath    HPI [name] is a 56 y.o. female.  The history is provided by the patient and medical records.  Shortness of Breath  Associated symptoms include cough and chest pain.    56 year old female with history of alcohol abuse, anxiety, depression, degenerative joint disease, GERD, hyperlipidemia, hypertension, history of palpitations, presenting to the ED for shortness of breath. Patient reports her husband was recently sick with the flu. States on Wednesday she began feeling "bad". With a cough. States this is continued and is productive of thick, green/brown sputum. Reports she has a lot of pain in her central chest when she coughs, no pain otherwise. States she has felt somewhat "queasy" and has been using her inhaler with some improvement. She denies any fever but does endorse chills. She's not had any significant lower extremity edema. Does report her shortness of breath is worse with exertion and walking.  She has been sleeping on a few pillows at home as she gets very short of breath when lying flat, however this also makes her cough more.  She has no known history of coronary artery disease or heart failure. She is not a smoker.  Past Medical History:  Diagnosis Date  . Alcohol abuse    abuse- moderate years ago - states only drinks now on weekends -2 to 8 driinks   . Anxiety   . Asthma   . COLONIC POLYPS, HX OF 04/05/2010  . Depression 02/10/2011   20 yrs ago - pt states no longer a problem  . DIVERTICULITIS, HX OF 04/05/2010  . DJD (degenerative joint disease)    right knee, mot to severe  . GERD (gastroesophageal reflux disease)    no meds  . Heart murmur    hx of   . Hyperlipidemia   . Hypertension   . Impaired glucose tolerance 12/06/2013  . Obesity (BMI 30-39.9)   . PALPITATIONS, HX  OF 09/14/2007  . Panic     Patient Active Problem List   Diagnosis Date Noted  . Acute sinus infection 01/25/2016  . Eustachian tube disorder 01/25/2016  . Asthma exacerbation 05/18/2015  . Peripheral edema 03/16/2015  . Asthma 03/16/2015  . Right shoulder pain 03/16/2015  . Hypokalemia 10/04/2014  . Upper airway cough syndrome 10/03/2014  . Severe obesity (BMI >= 40) ([place]) 10/03/2014  . Lower back pain 05/25/2014  . Bilateral shoulder pain 05/25/2014  . Chest pain 04/14/2014  . Allergic rhinitis 12/06/2013  . Impaired glucose tolerance 12/06/2013  . Expected blood loss anemia 12/16/2012  . Morbid obesity ([place]) 12/15/2012  . S/P left TKA 12/14/2012  . Goiter 11/26/2012  . Preop exam for internal medicine 11/26/2012  . Diarrhea 02/25/2012  . Colon polyps 02/10/2011  . Eczema 02/10/2011  . Depression 02/10/2011  . Preventative health care 09/27/2010  . Anxiety state 04/05/2010  . DIVERTICULITIS, HX OF 04/05/2010  . PALPITATIONS, HX OF 09/14/2007  . Obesity 04/24/2007  . Essential hypertension 04/24/2007  . VOCAL CORD DISORDER 04/24/2007  . Extrinsic asthma 04/24/2007  . GERD 04/24/2007    Past Surgical History:  Procedure Laterality Date  . ABDOMINAL HYSTERECTOMY    . KNEE ARTHROSCOPY     left   . TOTAL KNEE ARTHROPLASTY  07/29/2011   Procedure: TOTAL KNEE ARTHROPLASTY;  Surgeon: Mauri Pole,  MD;  Location: [facility];  Service: Orthopedics;  Laterality: Right;  . TOTAL KNEE ARTHROPLASTY Left 12/14/2012   Procedure: LEFT TOTAL KNEE ARTHROPLASTY;  Surgeon: Mauri Pole, MD;  Location: [facility];  Service: Orthopedics;  Laterality: Left;  Marland Kitchen VESICOVAGINAL FISTULA CLOSURE W/ TAH      OB History    No data available       Home Medications    Prior to Admission medications   Medication Sig Start Date End Date Taking? Authorizing Provider  albuterol (PROAIR HFA) 108 (90 Base) MCG/ACT inhaler Inhale 2 puffs into the lungs every 6 (six) hours as needed for wheezing or  shortness of breath. 12/07/15   Biagio Borg, MD  aspirin EC 81 MG tablet Take 1 tablet (81 mg total) by mouth daily. 12/07/15   Biagio Borg, MD  atorvastatin (LIPITOR) 10 MG tablet Take 1 tablet (10 mg total) by mouth daily. 12/07/15   Biagio Borg, MD  fluocinonide-emollient (LIDEX-E) 0.05 % cream Apply 1 application topically 2 (two) times daily. 12/07/15   Biagio Borg, MD  Fluticasone-Salmeterol (ADVAIR DISKUS) 250-50 MCG/DOSE AEPB Inhale 1 puff into the lungs 2 (two) times daily. 04/07/16   Biagio Borg, MD  LORazepam (ATIVAN) 0.5 MG tablet Take 1 tablet (0.5 mg total) by mouth 2 (two) times daily as needed for anxiety. 12/07/15   Biagio Borg, MD  losartan-hydrochlorothiazide (HYZAAR) 100-25 MG tablet Take 1 tablet by mouth every morning. 12/07/15   Biagio Borg, MD  terbinafine (LAMISIL) 250 MG tablet Take 1 tablet (250 mg total) by mouth daily. 02/15/16   Wallene Huh, DPM  traMADol (ULTRAM) 50 MG tablet Take 1 tablet (50 mg total) by mouth every 8 (eight) hours as needed. 12/07/15   Biagio Borg, MD    Family History Family History  Problem Relation Age of Onset  . Stroke Mother   . COPD Father   . Lymphoma Sister     Social History Social History  Substance Use Topics  . Smoking status: Former Smoker    Packs/day: 0.50    Years: 7.00    Types: Cigarettes    Quit date: 02/11/1988  . Smokeless tobacco: Never Used  . Alcohol use Yes     Comment: occasionally     Allergies   Morphine; Shrimp [shellfish allergy]; and Latex   Review of Systems Review of Systems  Respiratory: Positive for cough and shortness of breath.   Cardiovascular: Positive for chest pain.  All other systems reviewed and are negative.    Physical Exam Updated Vital Signs BP 131/79 (BP Location: Right Arm)   Pulse 107   Temp 100.1 F (37.8 C) (Oral)   Resp 25   SpO2 97%   Physical Exam  Constitutional: She is oriented to person, place, and time. She appears well-developed and  well-nourished.  Morbidly obese  HENT:  Head: Normocephalic and atraumatic.  Right Ear: Tympanic membrane and ear canal normal.  Left Ear: Tympanic membrane and ear canal normal.  Nose: Nose normal.  Mouth/Throat: Uvula is midline, oropharynx is clear and moist and mucous membranes are normal.  Eyes: Conjunctivae and EOM are normal. Pupils are equal, round, and reactive to light.  Neck: Normal range of motion.  Cardiovascular: Normal rate, regular rhythm and normal heart sounds.   Pulmonary/Chest: Effort normal. She has wheezes.  Mild expiratory wheezes, no acute distress, speaking in full sentences, O2 sats 98% during exam  Abdominal: Soft. Bowel sounds are normal.  Musculoskeletal: Normal range of motion.  Trace edema at the ankles  Neurological: She is alert and oriented to person, place, and time.  Skin: Skin is warm and dry.  Psychiatric: She has a normal mood and affect.  Nursing note and vitals reviewed.    ED Treatments / Results  Labs (all labs ordered are listed, but only abnormal results are displayed) Labs Reviewed  BASIC METABOLIC PANEL - Abnormal; Notable for the following:       Result Value   Potassium 3.3 (*)    Chloride 98 (*)    Glucose, Bld 102 (*)    All other components within normal limits  CBC  BRAIN NATRIURETIC PEPTIDE  I-STAT TROPOININ, ED    EKG  EKG Interpretation  Date/Time:  Friday April 18 2016 11:49:19 EST Ventricular Rate:  115 PR Interval:    QRS Duration: 88 QT Interval:  458 QTC Calculation: 633 R Axis:   59 Text Interpretation:  Sinus tachycardia Nonspecific ST and T wave abnormality Prolonged QT Confirmed by Ashok Cordia  MD, Lennette Bihari ([id number]) on 04/18/2016 1:14:39 PM       Radiology Dg Chest 2 View  Result Date: 04/18/2016 CLINICAL DATA:  Chest pain, cough, shortness of breath and fever. EXAM: CHEST  2 VIEW COMPARISON:  12/01/2012 FINDINGS: The heart size and mediastinal contours are within normal limits. There is some mild pulmonary  vascular prominence and venous hypertensive changes without overt airspace edema. There is no evidence of focal airspace consolidation, pneumothorax, nodule or pleural fluid. The visualized skeletal structures are unremarkable. IMPRESSION: Pulmonary vascular prominence and venous hypertensive changes without evidence of overt pulmonary edema or pleural effusions. Electronically Signed   By: Aletta Edouard M.D.   On: 04/18/2016 12:46    Procedures Procedures (including critical care time)  Medications Ordered in ED Medications  albuterol (PROVENTIL) (2.5 MG/3ML) 0.083% nebulizer solution 5 mg (not administered)  ipratropium (ATROVENT) nebulizer solution 0.5 mg (not administered)  predniSONE (DELTASONE) tablet 60 mg (not administered)     Initial Impression / Assessment and Plan / ED Course  I have reviewed the triage vital signs and the nursing notes.  Pertinent labs & imaging results that were available during my care of the patient were reviewed by me and considered in my medical decision making (see chart for details).  56 year old female here with shortness of breath. She reports recent productive cough. Has been recently sick with flu. She is afebrile and nontoxic. Her vitals are stable on room air. She has no evidence of respiratory distress. She does have some mild expiratory wheezes on exam and trace edema at the ankles, otherwise benign. Labwork is overall reassuring, troponin is negative. BNP is normal.  Chest x-ray with vascular prominence and evidence of hypertensive changes but no pulmonary edema or focal consolidation. After prednisone and albuterol and Atrovent neb here, patient appears improved. Her lung sounds have cleared. Her vitals remained stable on room air-- mild tachycardia which is suspect is from the albuterol. Suspect this is likely bronchitis. Lower suspicion for ACS, PE, dissection, or other acute cardiac event. As she has no fever, normal WBC count, no consolidation  noted on CXR, will hold on antibiotics.  We'll plan to discharge home on prednisone taper, Tessalon, and continue albuterol. Recommended close follow-up with her PCP next week.  Discussed plan with patient, she acknowledged understanding and agreed with plan of care.  Return precautions given for new or worsening symptoms.  Final Clinical Impressions(s) / ED Diagnoses   Final diagnoses:  Bronchitis  Wheezing  Cough    New Prescriptions New Prescriptions   ALBUTEROL (PROVENTIL HFA;VENTOLIN HFA) 108 (90 BASE) MCG/ACT INHALER    Inhale 1-2 puffs into the lungs every 6 (six) hours as needed for wheezing.   BENZONATATE (TESSALON) 100 MG CAPSULE    Take 1 capsule (100 mg total) by mouth every 8 (eight) hours.   PREDNISONE (DELTASONE) 20 MG TABLET    Take 40 mg by mouth daily for 3 days, then 20mg  by mouth daily for 3 days, then 10mg  daily for 3 days     Larene Pickett, PA-C 04/18/16 [place], MD 04/18/16 [phone]

## 2016-04-18 NOTE — Discharge Instructions (Signed)
Take the prescribed medication as directed. °Follow-up with your primary care doctor. °Return to the ED for new or worsening symptoms. °

## 2016-04-18 NOTE — ED Notes (Signed)
EDP at bedside  

## 2016-04-18 NOTE — ED Triage Notes (Signed)
Pt reports shortness of breath and pain in her chest when coughing onset two days ago. Associated with productive cough, dizziness and chills. RR e/u at triage. She did use her albuterol inhaler PTA and states she thinks it helped her a little

## 2016-04-18 NOTE — ED Notes (Signed)
PT went to X-ray

## 2016-04-22 ENCOUNTER — Telehealth: Payer: Self-pay | Admitting: Internal Medicine

## 2016-04-22 NOTE — Telephone Encounter (Signed)
Called pt and LMV to let her know about lab result--normal and cholesterol is little high and Dr. Jenny Reichmann notes on pt My Chart

## 2016-04-22 NOTE — Telephone Encounter (Signed)
Pt called and would like someone to call her about her lab results

## 2016-07-04 ENCOUNTER — Encounter: Payer: Self-pay | Admitting: Podiatry

## 2016-07-04 ENCOUNTER — Other Ambulatory Visit: Payer: Self-pay | Admitting: Gastroenterology

## 2016-07-04 ENCOUNTER — Ambulatory Visit (INDEPENDENT_AMBULATORY_CARE_PROVIDER_SITE_OTHER): Payer: Commercial Managed Care - PPO | Admitting: Podiatry

## 2016-07-04 DIAGNOSIS — M79676 Pain in unspecified toe(s): Secondary | ICD-10-CM

## 2016-07-04 DIAGNOSIS — B351 Tinea unguium: Secondary | ICD-10-CM

## 2016-07-04 NOTE — Progress Notes (Signed)
Complaint:  Visit Type: Patient returns to my office for continued preventative foot care services. Complaint: Patient states" my nails have grown long and thick and become painful to walk and wear shoes" . The patient presents for preventative foot care services. No changes to ROS  Podiatric Exam: Vascular: dorsalis pedis and posterior tibial pulses are palpable bilateral. Capillary return is immediate. Temperature gradient is WNL. Skin turgor WNL  Sensorium: Normal Semmes Weinstein monofilament test. Normal tactile sensation bilaterally. Nail Exam: Pt has thick disfigured discolored nails with subungual debris noted bilateral entire nail hallux through fifth toenails Ulcer Exam: There is no evidence of ulcer or pre-ulcerative changes or infection. Orthopedic Exam: Muscle tone and strength are WNL. No limitations in general ROM. No crepitus or effusions noted. DJD 1st MPJ  B/L Skin: No Porokeratosis. No infection or ulcers  Diagnosis:  Onychomycosis, , Pain in right toe, pain in left toes  Treatment & Plan Procedures and Treatment: Consent by patient was obtained for treatment procedures. The patient understood the discussion of treatment and procedures well. All questions were answered thoroughly reviewed. Debridement of mycotic and hypertrophic toenails, 1 through 5 bilateral and clearing of subungual debris. No ulceration, no infection noted.  Return Visit-Office Procedure: Patient instructed to return to the office for a follow up visit 3 months for continued evaluation and treatment.    Gardiner Barefoot DPM

## 2016-07-10 ENCOUNTER — Encounter: Payer: Self-pay | Admitting: Internal Medicine

## 2016-07-10 ENCOUNTER — Ambulatory Visit (INDEPENDENT_AMBULATORY_CARE_PROVIDER_SITE_OTHER): Payer: Commercial Managed Care - PPO | Admitting: Internal Medicine

## 2016-07-10 ENCOUNTER — Other Ambulatory Visit (INDEPENDENT_AMBULATORY_CARE_PROVIDER_SITE_OTHER): Payer: Commercial Managed Care - PPO

## 2016-07-10 VITALS — BP 136/88 | HR 82 | Ht 61.5 in | Wt 299.0 lb

## 2016-07-10 DIAGNOSIS — Z0000 Encounter for general adult medical examination without abnormal findings: Secondary | ICD-10-CM

## 2016-07-10 DIAGNOSIS — R7302 Impaired glucose tolerance (oral): Secondary | ICD-10-CM

## 2016-07-10 DIAGNOSIS — Z114 Encounter for screening for human immunodeficiency virus [HIV]: Secondary | ICD-10-CM

## 2016-07-10 DIAGNOSIS — Z1159 Encounter for screening for other viral diseases: Secondary | ICD-10-CM | POA: Diagnosis not present

## 2016-07-10 DIAGNOSIS — E785 Hyperlipidemia, unspecified: Secondary | ICD-10-CM

## 2016-07-10 DIAGNOSIS — I10 Essential (primary) hypertension: Secondary | ICD-10-CM

## 2016-07-10 DIAGNOSIS — R591 Generalized enlarged lymph nodes: Secondary | ICD-10-CM | POA: Insufficient documentation

## 2016-07-10 DIAGNOSIS — R221 Localized swelling, mass and lump, neck: Secondary | ICD-10-CM

## 2016-07-10 LAB — HEPATIC FUNCTION PANEL
ALT: 29 U/L (ref 0–35)
AST: 32 U/L (ref 0–37)
Albumin: 3.9 g/dL (ref 3.5–5.2)
Alkaline Phosphatase: 91 U/L (ref 39–117)
Bilirubin, Direct: 0.1 mg/dL (ref 0.0–0.3)
Total Bilirubin: 0.4 mg/dL (ref 0.2–1.2)
Total Protein: 7.5 g/dL (ref 6.0–8.3)

## 2016-07-10 LAB — HEPATITIS C ANTIBODY: HCV Ab: NEGATIVE

## 2016-07-10 NOTE — Assessment & Plan Note (Addendum)
stable overall by history and exam, recent data reviewed with pt, and pt to continue medical treatment as before,  to f/u any worsening symptoms or concerns BP Readings from Last 3 Encounters:  07/10/16 136/88  04/18/16 106/65  02/15/16 126/82

## 2016-07-10 NOTE — Assessment & Plan Note (Signed)

## 2016-07-10 NOTE — Progress Notes (Signed)
Subjective:    Patient ID: [name], female    DOB: 1960/11/11, 56 y.o.   MRN: [id number]  HPI  Here for wellness and f/u;  Overall doing ok;  Pt denies Chest pain, worsening SOB, DOE, wheezing, orthopnea, PND, worsening LE edema, palpitations, dizziness or syncope.  Pt denies neurological change such as new headache, facial or extremity weakness.  Pt denies polydipsia, polyuria, or low sugar symptoms. Pt states overall good compliance with treatment and medications, good tolerability, and has been trying to follow appropriate diet.  Pt denies worsening depressive symptoms, suicidal ideation or panic. No fever, night sweats, wt loss, loss of appetite, or other constitutional symptoms.  Pt states good ability with ADL's, has low fall risk, home safety reviewed and adequate, no other significant changes in hearing or vision, and only occasionally active with exercise.  Has colonoscopy sched in f/u colon polyp June 15   No other new hx.  Did have increased stress recently related to brother died and then good friend at work both died since Jun 11, 2022 this year, suddenly, no hospice involved. Past Medical History:  Diagnosis Date  . Alcohol abuse    abuse- moderate years ago - states only drinks now on weekends -2 to 8 driinks   . Anxiety   . Asthma   . COLONIC POLYPS, HX OF 04/05/2010  . Depression 02/10/2011   20 yrs ago - pt states no longer a problem  . DIVERTICULITIS, HX OF 04/05/2010  . DJD (degenerative joint disease)    right knee, mot to severe  . GERD (gastroesophageal reflux disease)    no meds  . Heart murmur    hx of   . Hyperlipidemia   . Hypertension   . Impaired glucose tolerance 12/06/2013  . Obesity (BMI 30-39.9)   . PALPITATIONS, HX OF 09/14/2007  . Panic    Past Surgical History:  Procedure Laterality Date  . ABDOMINAL HYSTERECTOMY    . KNEE ARTHROSCOPY     left   . TOTAL KNEE ARTHROPLASTY  07/29/2011   Procedure: TOTAL KNEE ARTHROPLASTY;  Surgeon: Mauri Pole, MD;   Location: [facility];  Service: Orthopedics;  Laterality: Right;  . TOTAL KNEE ARTHROPLASTY Left 12/14/2012   Procedure: LEFT TOTAL KNEE ARTHROPLASTY;  Surgeon: Mauri Pole, MD;  Location: [facility];  Service: Orthopedics;  Laterality: Left;  Marland Kitchen VESICOVAGINAL FISTULA CLOSURE W/ TAH      reports that she quit smoking about 28 years ago. Her smoking use included Cigarettes. She has a 3.50 pack-year smoking history. She has never used smokeless tobacco. She reports that she drinks alcohol. She reports that she does not use drugs. family history includes COPD in her father; Lymphoma in her sister; Stroke in her mother. Allergies  Allergen Reactions  . Morphine Itching  . Shrimp [Shellfish Allergy] Itching  . Latex Rash   Current Outpatient Prescriptions on File Prior to Visit  Medication Sig Dispense Refill  . albuterol (PROVENTIL HFA;VENTOLIN HFA) 108 (90 Base) MCG/ACT inhaler Inhale 1-2 puffs into the lungs every 6 (six) hours as needed for wheezing. 1 Inhaler 0  . aspirin EC 81 MG tablet Take 1 tablet (81 mg total) by mouth daily. 90 tablet 11  . atorvastatin (LIPITOR) 10 MG tablet Take 1 tablet (10 mg total) by mouth daily. 90 tablet 3  . fluocinonide-emollient (LIDEX-E) 0.05 % cream Apply 1 application topically 2 (two) times daily. 30 g 0  . Fluticasone-Salmeterol (ADVAIR DISKUS) 250-50 MCG/DOSE AEPB Inhale 1 puff  into the lungs 2 (two) times daily. 3 each 2  . LORazepam (ATIVAN) 0.5 MG tablet Take 1 tablet (0.5 mg total) by mouth 2 (two) times daily as needed for anxiety. 60 tablet 1  . losartan-hydrochlorothiazide (HYZAAR) 100-25 MG tablet Take 1 tablet by mouth every morning. 90 tablet 3  . terbinafine (LAMISIL) 250 MG tablet Take 1 tablet (250 mg total) by mouth daily. 90 tablet 0  . traMADol (ULTRAM) 50 MG tablet Take 1 tablet (50 mg total) by mouth every 8 (eight) hours as needed. 90 tablet 2   No current facility-administered medications on file prior to visit.    Review of  Systems Constitutional: Negative for other unusual diaphoresis, sweats, appetite or weight changes HENT: Negative for other worsening hearing loss, ear pain, facial swelling, mouth sores or neck stiffness.   Eyes: Negative for other worsening pain, redness or other visual disturbance.  Respiratory: Negative for other stridor or swelling Cardiovascular: Negative for other palpitations or other chest pain  Gastrointestinal: Negative for worsening diarrhea or loose stools, blood in stool, distention or other pain Genitourinary: Negative for hematuria, flank pain or other change in urine volume.  Musculoskeletal: Negative for myalgias or other joint swelling.  Skin: Negative for other color change, or other wound or worsening drainage.  Neurological: Negative for other syncope or numbness. Hematological: Negative for other adenopathy or swelling Psychiatric/Behavioral: Negative for hallucinations, other worsening agitation, SI, self-injury, or new decreased concentration All other system neg per pt    Objective:   Physical Exam BP 136/88   Pulse 82   Ht 5' 1.5" (1.562 m)   Wt 299 lb (135.6 kg)   SpO2 99%   BMI 55.58 kg/m  VS noted, morbid Constitutional: Pt is oriented to person, place, and time. Appears well-developed and well-nourished, in no significant distress and comfortable Head: Normocephalic and atraumatic  Eyes: Conjunctivae and EOM are normal. Pupils are equal, round, and reactive to light Right Ear: External ear normal without discharge Left Ear: External ear normal without discharge Nose: Nose without discharge or deformity Mouth/Throat: Oropharynx is without other ulcerations and moist  Neck: Normal range of motion. Neck supple. No JVD present. No tracheal deviation present, did have bilat submandibulat and angle of jaw LA firm small and shotty , nontender, as well as left neck mass subq to left mid anterior neck superior to the mid left thyroid - ? Salivary gland (no  similar mass to right) Cardiovascular: Normal rate, regular rhythm, normal heart sounds and intact distal pulses.   Pulmonary/Chest: WOB normal and breath sounds without rales or wheezing  Abdominal: Soft. Bowel sounds are normal. NT. No HSM  Musculoskeletal: Normal range of motion. Exhibits no edema Lymphadenopathy: Has no other cervical adenopathy.  Neurological: Pt is alert and oriented to person, place, and time. Pt has normal reflexes. No cranial nerve deficit. Motor grossly intact, Gait intact Skin: Skin is warm and dry. No rash noted or new ulcerations Psychiatric:  Has normal mood and affect. Behavior is normal without agitation No other exam findings  Lab Results  Component Value Date   WBC 9.3 04/18/2016   HGB 13.9 04/18/2016   HCT 42.7 04/18/2016   PLT 212 04/18/2016   GLUCOSE 102 (H) 04/18/2016   CHOL 208 (H) 04/17/2016   TRIG 83.0 04/17/2016   HDL 76.70 04/17/2016   LDLDIRECT 107.0 02/23/2012   LDLCALC 115 (H) 04/17/2016   ALT 22 04/17/2016   AST 30 04/17/2016   NA 135 04/18/2016   K  3.3 (L) 04/18/2016   CL 98 (L) 04/18/2016   CREATININE 0.96 04/18/2016   BUN 13 04/18/2016   CO2 25 04/18/2016   TSH 2.35 04/17/2016   INR 0.96 12/01/2012   HGBA1C 5.9 04/17/2016         Assessment & Plan:

## 2016-07-10 NOTE — Assessment & Plan Note (Signed)
stable overall by history and exam, recent data reviewed with pt, and pt to continue medical treatment as before,  to f/u any worsening symptoms or concerns Lab Results  Component Value Date   HGBA1C 5.9 04/17/2016

## 2016-07-10 NOTE — Patient Instructions (Addendum)
Please continue all other medications as before, and refills have been done if requested.  Please have the pharmacy call with any other refills you may need.  Please continue your efforts at being more active, low cholesterol diet, and weight control.  You are otherwise up to date with prevention measures today.  You will be contacted regarding the referral for: ENT  Please keep your appointments with your specialists as you may have planned  Please go to the LAB in the Basement (turn left off the elevator) for the tests to be done today  You will be contacted by phone if any changes need to be made immediately.  Otherwise, you will receive a letter about your results with an explanation, but please check with MyChart first.  Please remember to sign up for MyChart if you have not done so, as this will be important to you in the future with finding out test results, communicating by private email, and scheduling acute appointments online when needed.  Please return in 1 year for your yearly visit, or sooner if needed, with Lab testing done 3-5 days before

## 2016-07-10 NOTE — Assessment & Plan Note (Addendum)
stable overall by history and exam, recent data reviewed with pt, and pt to continue medical treatment as before,  to f/u any worsening symptoms or concerns Lab Results  Component Value Date   LDLCALC 115 (H) 04/17/2016

## 2016-07-11 LAB — HIV ANTIBODY (ROUTINE TESTING W REFLEX): HIV 1&2 Ab, 4th Generation: NONREACTIVE

## 2016-07-14 ENCOUNTER — Encounter (HOSPITAL_COMMUNITY): Payer: Self-pay | Admitting: Emergency Medicine

## 2016-07-14 ENCOUNTER — Ambulatory Visit (HOSPITAL_COMMUNITY)
Admission: EM | Admit: 2016-07-14 | Discharge: 2016-07-14 | Disposition: A | Discharge status: Home / Self Care | Payer: Commercial Managed Care - PPO | Attending: Emergency Medicine | Admitting: Emergency Medicine

## 2016-07-14 DIAGNOSIS — R300 Dysuria: Secondary | ICD-10-CM | POA: Diagnosis not present

## 2016-07-14 DIAGNOSIS — Z87891 Personal history of nicotine dependence: Secondary | ICD-10-CM | POA: Diagnosis not present

## 2016-07-14 DIAGNOSIS — R319 Hematuria, unspecified: Secondary | ICD-10-CM | POA: Diagnosis not present

## 2016-07-14 DIAGNOSIS — Z79899 Other long term (current) drug therapy: Secondary | ICD-10-CM | POA: Diagnosis not present

## 2016-07-14 DIAGNOSIS — N390 Urinary tract infection, site not specified: Secondary | ICD-10-CM | POA: Insufficient documentation

## 2016-07-14 DIAGNOSIS — Z7982 Long term (current) use of aspirin: Secondary | ICD-10-CM | POA: Diagnosis not present

## 2016-07-14 LAB — POCT URINALYSIS DIP (DEVICE)
Glucose, UA: NEGATIVE mg/dL
Leukocytes, UA: NEGATIVE
Nitrite: NEGATIVE
Protein, ur: 100 mg/dL — AB
Specific Gravity, Urine: >=1.03 (ref 1.005–1.030)
Urobilinogen, UA: 0.2 mg/dL (ref 0.0–1.0)
pH: 5.5 (ref 5.0–8.0)

## 2016-07-14 MED ORDER — CEPHALEXIN 500 MG PO CAPS: 500.0000 mg | ORAL_CAPSULE | Freq: Four times a day (QID) | ORAL | 0 refills | Status: DC

## 2016-07-14 MED ORDER — HYDROCODONE-ACETAMINOPHEN 5-325 MG PO TABS: 2.0000 | ORAL_TABLET | ORAL | 0 refills | Status: DC | PRN

## 2016-07-14 NOTE — ED Triage Notes (Signed)
The patient presented to the [facility] with a complaint of dysuria and urinary frequency x 3 days ago.

## 2016-07-14 NOTE — ED Provider Notes (Signed)
CSN: [id number]     Arrival date & time 07/14/16  1804 History   None    Chief Complaint  Patient presents with  . Dysuria   (Consider location/radiation/quality/duration/timing/severity/associated sxs/prior Treatment) The history is provided by the patient. No language interpreter was used.  Dysuria  Pain quality:  Aching Pain severity:  Moderate Onset quality:  Gradual Duration:  3 days Timing:  Constant Progression:  Worsening Chronicity:  New Relieved by:  Nothing Worsened by:  Nothing Ineffective treatments:  None tried Associated symptoms: no abdominal pain   Risk factors: no recurrent urinary tract infections   Pt complains of burning with urination.  Pt reports she has had urinary tract infections in the past.  Past Medical History:  Diagnosis Date  . Alcohol abuse    abuse- moderate years ago - states only drinks now on weekends -2 to 8 driinks   . Anxiety   . Asthma   . COLONIC POLYPS, HX OF 04/05/2010  . Depression 02/10/2011   20 yrs ago - pt states no longer a problem  . DIVERTICULITIS, HX OF 04/05/2010  . DJD (degenerative joint disease)    right knee, mot to severe  . GERD (gastroesophageal reflux disease)    no meds  . Heart murmur    hx of   . Hyperlipidemia   . Hypertension   . Impaired glucose tolerance 12/06/2013  . Obesity (BMI 30-39.9)   . PALPITATIONS, HX OF 09/14/2007  . Panic    Past Surgical History:  Procedure Laterality Date  . ABDOMINAL HYSTERECTOMY    . KNEE ARTHROSCOPY     left   . TOTAL KNEE ARTHROPLASTY  07/29/2011   Procedure: TOTAL KNEE ARTHROPLASTY;  Surgeon: Mauri Pole, MD;  Location: [facility];  Service: Orthopedics;  Laterality: Right;  . TOTAL KNEE ARTHROPLASTY Left 12/14/2012   Procedure: LEFT TOTAL KNEE ARTHROPLASTY;  Surgeon: Mauri Pole, MD;  Location: [facility];  Service: Orthopedics;  Laterality: Left;  Marland Kitchen VESICOVAGINAL FISTULA CLOSURE W/ TAH     Family History  Problem Relation Age of Onset  . Stroke Mother   . COPD  Father   . Lymphoma Sister    Social History  Substance Use Topics  . Smoking status: Former Smoker    Packs/day: 0.50    Years: 7.00    Types: Cigarettes    Quit date: 02/11/1988  . Smokeless tobacco: Never Used  . Alcohol use Yes     Comment: occasionally   OB History    No data available     Review of Systems  Gastrointestinal: Negative for abdominal pain.  Genitourinary: Positive for dysuria.  All other systems reviewed and are negative.   Allergies  Morphine; Shrimp [shellfish allergy]; and Latex  Home Medications   Prior to Admission medications   Medication Sig Start Date End Date Taking? Authorizing Provider  albuterol (PROVENTIL HFA;VENTOLIN HFA) 108 (90 Base) MCG/ACT inhaler Inhale 1-2 puffs into the lungs every 6 (six) hours as needed for wheezing. 04/18/16   Larene Pickett, PA-C  aspirin EC 81 MG tablet Take 1 tablet (81 mg total) by mouth daily. 12/07/15   Biagio Borg, MD  atorvastatin (LIPITOR) 10 MG tablet Take 1 tablet (10 mg total) by mouth daily. 12/07/15   Biagio Borg, MD  cephALEXin (KEFLEX) 500 MG capsule Take 1 capsule (500 mg total) by mouth 4 (four) times daily. 07/14/16   Fransico Meadow, PA-C  fluocinonide-emollient (LIDEX-E) 0.05 % cream Apply 1 application  topically 2 (two) times daily. 12/07/15   Biagio Borg, MD  Fluticasone-Salmeterol (ADVAIR DISKUS) 250-50 MCG/DOSE AEPB Inhale 1 puff into the lungs 2 (two) times daily. 04/07/16   Biagio Borg, MD  HYDROcodone-acetaminophen (NORCO/VICODIN) 5-325 MG tablet Take 2 tablets by mouth every 4 (four) hours as needed. 07/14/16   Fransico Meadow, PA-C  LORazepam (ATIVAN) 0.5 MG tablet Take 1 tablet (0.5 mg total) by mouth 2 (two) times daily as needed for anxiety. 12/07/15   Biagio Borg, MD  losartan-hydrochlorothiazide (HYZAAR) 100-25 MG tablet Take 1 tablet by mouth every morning. 12/07/15   Biagio Borg, MD  terbinafine (LAMISIL) 250 MG tablet Take 1 tablet (250 mg total) by mouth daily. 02/15/16    Wallene Huh, DPM  traMADol (ULTRAM) 50 MG tablet Take 1 tablet (50 mg total) by mouth every 8 (eight) hours as needed. 12/07/15   Biagio Borg, MD   Meds Ordered and Administered this Visit  Medications - No data to display  BP (!) 165/80 (BP Location: Right Arm)   Pulse (!) 105   Temp 98.9 F (37.2 C) (Oral)   Resp 20   SpO2 100%  No data found.   Physical Exam  Constitutional: She appears well-developed and well-nourished.  Cardiovascular: Normal rate and regular rhythm.   Pulmonary/Chest: Effort normal.  Abdominal: Soft.  Musculoskeletal: Normal range of motion.  Neurological: She is alert.  Skin: Skin is warm.  Psychiatric: She has a normal mood and affect.  Nursing note and vitals reviewed.   Urgent Care Course     Procedures (including critical care time)  Labs Review Labs Reviewed  POCT URINALYSIS DIP (DEVICE) - Abnormal; Notable for the following:       Result Value   Bilirubin Urine SMALL (*)    Ketones, ur TRACE (*)    Hgb urine dipstick MODERATE (*)    Protein, ur 100 (*)    All other components within normal limits  URINE CULTURE    Imaging Review No results found.   Visual Acuity Review  Right Eye Distance:   Left Eye Distance:   Bilateral Distance:    Right Eye Near:   Left Eye Near:    Bilateral Near:         MDM  Urine culture pending,  Clinically pt has uti.   I advised pt she needs to see her MD for recheck.  Culture pending.    1. Urinary tract infection with hematuria, site unspecified    Meds ordered this encounter  Medications  . cephALEXin (KEFLEX) 500 MG capsule    Sig: Take 1 capsule (500 mg total) by mouth 4 (four) times daily.    Dispense:  40 capsule    Refill:  0    Order Specific Question:   Supervising Provider    Answer:   Melynda Ripple [[id number]]  . HYDROcodone-acetaminophen (NORCO/VICODIN) 5-325 MG tablet    Sig: Take 2 tablets by mouth every 4 (four) hours as needed.    Dispense:  10 tablet    Refill:   0    Order Specific Question:   Supervising Provider    Answer:   Melynda Ripple [[id number]]   An After Visit Summary was printed and given to the patient.     Fransico Meadow, [place] 07/14/16 2140

## 2016-07-14 NOTE — Discharge Instructions (Signed)
Return if any problems.

## 2016-07-16 LAB — URINE CULTURE

## 2016-07-17 ENCOUNTER — Encounter (HOSPITAL_COMMUNITY): Payer: Self-pay | Admitting: Emergency Medicine

## 2016-07-17 ENCOUNTER — Telehealth: Payer: Self-pay | Admitting: Internal Medicine

## 2016-07-17 ENCOUNTER — Ambulatory Visit (HOSPITAL_COMMUNITY)
Admission: EM | Admit: 2016-07-17 | Discharge: 2016-07-17 | Disposition: A | Discharge status: Home / Self Care | Payer: Commercial Managed Care - PPO | Attending: Internal Medicine | Admitting: Internal Medicine

## 2016-07-17 DIAGNOSIS — F329 Major depressive disorder, single episode, unspecified: Secondary | ICD-10-CM | POA: Insufficient documentation

## 2016-07-17 DIAGNOSIS — F411 Generalized anxiety disorder: Secondary | ICD-10-CM | POA: Insufficient documentation

## 2016-07-17 DIAGNOSIS — M1711 Unilateral primary osteoarthritis, right knee: Secondary | ICD-10-CM | POA: Diagnosis not present

## 2016-07-17 DIAGNOSIS — Z87891 Personal history of nicotine dependence: Secondary | ICD-10-CM | POA: Insufficient documentation

## 2016-07-17 DIAGNOSIS — Z96652 Presence of left artificial knee joint: Secondary | ICD-10-CM | POA: Insufficient documentation

## 2016-07-17 DIAGNOSIS — N952 Postmenopausal atrophic vaginitis: Secondary | ICD-10-CM | POA: Diagnosis not present

## 2016-07-17 DIAGNOSIS — R300 Dysuria: Secondary | ICD-10-CM | POA: Diagnosis present

## 2016-07-17 DIAGNOSIS — I10 Essential (primary) hypertension: Secondary | ICD-10-CM | POA: Insufficient documentation

## 2016-07-17 DIAGNOSIS — L249 Irritant contact dermatitis, unspecified cause: Secondary | ICD-10-CM | POA: Diagnosis not present

## 2016-07-17 DIAGNOSIS — K219 Gastro-esophageal reflux disease without esophagitis: Secondary | ICD-10-CM | POA: Diagnosis not present

## 2016-07-17 DIAGNOSIS — Z6839 Body mass index (BMI) 39.0-39.9, adult: Secondary | ICD-10-CM | POA: Insufficient documentation

## 2016-07-17 DIAGNOSIS — Z86010 Personal history of colonic polyps: Secondary | ICD-10-CM | POA: Insufficient documentation

## 2016-07-17 DIAGNOSIS — Z7982 Long term (current) use of aspirin: Secondary | ICD-10-CM | POA: Insufficient documentation

## 2016-07-17 DIAGNOSIS — E785 Hyperlipidemia, unspecified: Secondary | ICD-10-CM | POA: Insufficient documentation

## 2016-07-17 DIAGNOSIS — J45909 Unspecified asthma, uncomplicated: Secondary | ICD-10-CM | POA: Diagnosis not present

## 2016-07-17 MED ORDER — TRIAMCINOLONE ACETONIDE 0.1 % EX CREA: 1.0000 "application " | TOPICAL_CREAM | Freq: Two times a day (BID) | CUTANEOUS | 0 refills | Status: DC

## 2016-07-17 MED ORDER — HYDROCODONE-ACETAMINOPHEN 5-325 MG PO TABS: 1.0000 | ORAL_TABLET | Freq: Four times a day (QID) | ORAL | 0 refills | Status: DC | PRN

## 2016-07-17 NOTE — Telephone Encounter (Signed)
Pt said that she was seen in the ED for a possible UTI. They gave her an antibiotic but on her labs it states that it does not clearly show a UTI and could be some other issues. She said that is has gotten a little better and she is still taking what was prescribed. She wanted to check with Dr Jenny Reichmann to make sure that he would not want to change her treatment or follow up with her. Please advise.

## 2016-07-17 NOTE — ED Provider Notes (Signed)
[facility]    CSN: [id number] Arrival date & time: 07/17/16  [place]     History   Chief Complaint Chief Complaint  Patient presents with  . Urinary Tract Infection    HPI [name] is a 56 y.o. female. She was seen on 6/4, having dysuria, thought to have a UTI. Keflex has not been helping. Urine culture did not show a UTI. No pelvic pain. No vaginal discharge. No change in bowel habits. Has been using a new body wash, recently changed laundry detergents, and uses personal wipes. Worried about cancer, her brother died recently with stage IV cancer.    HPI  Past Medical History:  Diagnosis Date  . Alcohol abuse    abuse- moderate years ago - states only drinks now on weekends -2 to 8 driinks   . Anxiety   . Asthma   . COLONIC POLYPS, HX OF 04/05/2010  . Depression 02/10/2011   20 yrs ago - pt states no longer a problem  . DIVERTICULITIS, HX OF 04/05/2010  . DJD (degenerative joint disease)    right knee, mot to severe  . GERD (gastroesophageal reflux disease)    no meds  . Heart murmur    hx of   . Hyperlipidemia   . Hypertension   . Impaired glucose tolerance 12/06/2013  . Obesity (BMI 30-39.9)   . PALPITATIONS, HX OF 09/14/2007  . Panic     Patient Active Problem List   Diagnosis Date Noted  . Hyperlipidemia 07/10/2016  . Mass of left side of neck 07/10/2016  . Head and neck lymphadenopathy 07/10/2016  . Acute sinus infection 01/25/2016  . Eustachian tube disorder 01/25/2016  . Asthma exacerbation 05/18/2015  . Peripheral edema 03/16/2015  . Asthma 03/16/2015  . Right shoulder pain 03/16/2015  . Hypokalemia 10/04/2014  . Upper airway cough syndrome 10/03/2014  . Severe obesity (BMI >= 40) ([place]) 10/03/2014  . Lower back pain 05/25/2014  . Bilateral shoulder pain 05/25/2014  . Chest pain 04/14/2014  . Allergic rhinitis 12/06/2013  . Impaired glucose tolerance 12/06/2013  . Expected blood loss anemia 12/16/2012  . Morbid obesity ([place])  12/15/2012  . S/P left TKA 12/14/2012  . Goiter 11/26/2012  . Preop exam for internal medicine 11/26/2012  . Diarrhea 02/25/2012  . Colon polyps 02/10/2011  . Eczema 02/10/2011  . Depression 02/10/2011  . Preventative health care 09/27/2010  . Anxiety state 04/05/2010  . DIVERTICULITIS, HX OF 04/05/2010  . PALPITATIONS, HX OF 09/14/2007  . Obesity 04/24/2007  . Essential hypertension 04/24/2007  . VOCAL CORD DISORDER 04/24/2007  . Extrinsic asthma 04/24/2007  . GERD 04/24/2007    Past Surgical History:  Procedure Laterality Date  . ABDOMINAL HYSTERECTOMY    . KNEE ARTHROSCOPY     left   . TOTAL KNEE ARTHROPLASTY  07/29/2011   Procedure: TOTAL KNEE ARTHROPLASTY;  Surgeon: Mauri Pole, MD;  Location: [facility];  Service: Orthopedics;  Laterality: Right;  . TOTAL KNEE ARTHROPLASTY Left 12/14/2012   Procedure: LEFT TOTAL KNEE ARTHROPLASTY;  Surgeon: Mauri Pole, MD;  Location: [facility];  Service: Orthopedics;  Laterality: Left;  Marland Kitchen VESICOVAGINAL FISTULA CLOSURE W/ TAH       Home Medications    Prior to Admission medications   Medication Sig Start Date End Date Taking? Authorizing Provider  aspirin EC 81 MG tablet Take 1 tablet (81 mg total) by mouth daily. 12/07/15  Yes Biagio Borg, MD  atorvastatin (LIPITOR) 10 MG tablet Take 1  tablet (10 mg total) by mouth daily. 12/07/15  Yes Biagio Borg, MD  Fluticasone-Salmeterol (ADVAIR DISKUS) 250-50 MCG/DOSE AEPB Inhale 1 puff into the lungs 2 (two) times daily. 04/07/16  Yes Biagio Borg, MD  LORazepam (ATIVAN) 0.5 MG tablet Take 1 tablet (0.5 mg total) by mouth 2 (two) times daily as needed for anxiety. 12/07/15  Yes Biagio Borg, MD  losartan-hydrochlorothiazide (HYZAAR) 100-25 MG tablet Take 1 tablet by mouth every morning. 12/07/15  Yes Biagio Borg, MD  albuterol (PROVENTIL HFA;VENTOLIN HFA) 108 (90 Base) MCG/ACT inhaler Inhale 1-2 puffs into the lungs every 6 (six) hours as needed for wheezing. 04/18/16   Larene Pickett, PA-C    cephALEXin (KEFLEX) 500 MG capsule Take 1 capsule (500 mg total) by mouth 4 (four) times daily. 07/14/16   Fransico Meadow, PA-C  fluocinonide-emollient (LIDEX-E) 0.05 % cream Apply 1 application topically 2 (two) times daily. 12/07/15   Biagio Borg, MD  HYDROcodone-acetaminophen (NORCO/VICODIN) 5-325 MG tablet Take 1 tablet by mouth 4 (four) times daily as needed. 07/17/16   Sherlene Shams, MD  terbinafine (LAMISIL) 250 MG tablet Take 1 tablet (250 mg total) by mouth daily. 02/15/16   Wallene Huh, DPM  traMADol (ULTRAM) 50 MG tablet Take 1 tablet (50 mg total) by mouth every 8 (eight) hours as needed. 12/07/15   Biagio Borg, MD  triamcinolone cream (KENALOG) 0.1 % Apply 1 application topically 2 (two) times daily. 07/17/16   Sherlene Shams, MD    Family History Family History  Problem Relation Age of Onset  . Stroke Mother   . COPD Father   . Lymphoma Sister     Social History Social History  Substance Use Topics  . Smoking status: Former Smoker    Packs/day: 0.50    Years: 7.00    Types: Cigarettes    Quit date: 02/11/1988  . Smokeless tobacco: Never Used  . Alcohol use Yes     Comment: occasionally     Allergies   Morphine; Shrimp [shellfish allergy]; and Latex   Review of Systems Review of Systems  All other systems reviewed and are negative.    Physical Exam Triage Vital Signs ED Triage Vitals  Enc Vitals Group     BP 07/17/16 1928 (!) 118/58     Pulse Rate 07/17/16 1928 92     Resp 07/17/16 1928 20     Temp 07/17/16 1928 98.6 F (37 C)     Temp Source 07/17/16 1928 Oral     SpO2 07/17/16 1928 99 %     Weight --      Height --      Pain Score 07/17/16 1929 7     Pain Loc --    Updated Vital Signs BP (!) 118/58 (BP Location: Right Arm)   Pulse 92   Temp 98.6 F (37 C) (Oral)   Resp 20   SpO2 99%   Physical Exam  Constitutional: She is oriented to person, place, and time. No distress.  HENT:  Head: Atraumatic.  Eyes:  Conjugate gaze observed,  no eye redness/discharge  Neck: Neck supple.  Cardiovascular: Normal rate.   Pulmonary/Chest: No respiratory distress.  Abdominal: She exhibits no distension.  Genitourinary:  Genitourinary Comments: External exam performed, periurethral area appears atrophic, maybe slightly inflamed. No discrete lesion.  Musculoskeletal: Normal range of motion.  Neurological: She is alert and oriented to person, place, and time.  Skin: Skin is warm and dry.  Nursing note and vitals reviewed.    UC Treatments / Results  Labs Results for orders placed or performed during the hospital encounter of 07/14/16  Urine culture  Result Value Ref Range   Specimen Description URINE, CLEAN CATCH    Special Requests NONE    Culture MULTIPLE SPECIES PRESENT, SUGGEST RECOLLECTION (A)    Report Status 07/16/2016 FINAL   POCT urinalysis dip (device)  Result Value Ref Range   Glucose, UA NEGATIVE NEGATIVE mg/dL   Bilirubin Urine SMALL (A) NEGATIVE   Ketones, ur TRACE (A) NEGATIVE mg/dL   Specific Gravity, Urine >=1.030 1.005 - 1.030   Hgb urine dipstick MODERATE (A) NEGATIVE   pH 5.5 5.0 - 8.0   Protein, ur 100 (A) NEGATIVE mg/dL   Urobilinogen, UA 0.2 0.0 - 1.0 mg/dL   Nitrite NEGATIVE NEGATIVE   Leukocytes, UA NEGATIVE NEGATIVE   Procedures Procedures (including critical care time) None today  Final Clinical Impressions(s) / UC Diagnoses   Final diagnoses:  Atrophic vaginitis  Irritant dermatitis   Urinary discomfort does not seem to be coming from a UTI.  Recent urine culture did not demonstrate a UTI, and antibiotics are not helping.  Ok to stop taking cephalexin.  Urine tests for common pelvic infections, which may cause urinary symptoms, are pending.  The urgent care will contact you if further treatment is needed.   On exam, vaginal tissues are a little thin/irritated appearing, which may be due to menopause/loss of estrogen effect or to irritation from a hygiene product (body wash, personal  wipes, or laundry detergent).  Would use only water or water/mild soap like Dove bar soap to wash, no wipes or body wash, and rinse off thoroughly.  Pat dry and apply small amount of triamcinolone cream.  An estrogen cream may help relieve symptoms.  Followup with your primary care provider to discuss other strategies for managing urinary discomfort.    New Prescriptions Discharge Medication List as of 07/17/2016  8:55 PM    START taking these medications   Details  triamcinolone cream (KENALOG) 0.1 % Apply 1 application topically 2 (two) times daily., Starting Thu 07/17/2016, Normal         Sherlene Shams, MD 07/19/16 1053

## 2016-07-17 NOTE — Discharge Instructions (Addendum)
Urinary discomfort does not seem to be coming from a UTI.  Recent urine culture did not demonstrate a UTI, and antibiotics are not helping.  Ok to stop taking cephalexin.  Urine tests for common pelvic infections, which may cause urinary symptoms, are pending.  The urgent care will contact you if further treatment is needed.   On exam, vaginal tissues are a little thin/irritated appearing, which may be due to menopause/loss of estrogen effect or to irritation from a hygiene product (body wash, personal wipes, or laundry detergent).  Would use only water or water/mild soap like Dove bar soap to wash, no wipes or body wash, and rinse off thoroughly.  Pat dry and apply small amount of triamcinolone cream.  An estrogen cream may help relieve symptoms.  Followup with your primary care provider to discuss other strategies for managing urinary discomfort.

## 2016-07-17 NOTE — ED Triage Notes (Signed)
Pt here for persistent urinary sx ... Seen here on 6/4 for similar sx  Sx today include dysuria, urinary freq/urgency, back pain  Denies fevers, chills  A&O x4... NAD... Ambulatory

## 2016-07-17 NOTE — Telephone Encounter (Signed)
Please make ROV for 1 wk, thanks

## 2016-07-17 NOTE — ED Notes (Signed)
Urine specimen obtained while patient in lobby. Specimen in lab 

## 2016-07-18 NOTE — Telephone Encounter (Signed)
Called pt to make her appointment. She said that she went back to the urgent care and they told her that she did not have a UTI but that it was probably coming from the thinning of her vaginal walls due to menopause. They prescribed her an estrogen cream to apply. She said that she did not need to make an appointment with Dr Jenny Reichmann at this time but would call back if anything changed.

## 2016-07-21 LAB — URINE CYTOLOGY ANCILLARY ONLY
Bacterial vaginitis: NEGATIVE
Candida vaginitis: NEGATIVE
Chlamydia: NEGATIVE
Neisseria Gonorrhea: NEGATIVE
Trichomonas: NEGATIVE

## 2016-07-24 ENCOUNTER — Encounter (HOSPITAL_COMMUNITY): Payer: Self-pay | Admitting: *Deleted

## 2016-07-25 ENCOUNTER — Other Ambulatory Visit (INDEPENDENT_AMBULATORY_CARE_PROVIDER_SITE_OTHER): Payer: Commercial Managed Care - PPO

## 2016-07-25 ENCOUNTER — Ambulatory Visit (INDEPENDENT_AMBULATORY_CARE_PROVIDER_SITE_OTHER): Payer: Commercial Managed Care - PPO | Admitting: Internal Medicine

## 2016-07-25 ENCOUNTER — Encounter (HOSPITAL_COMMUNITY)
Admission: RE | Disposition: A | Discharge status: Home / Self Care | Payer: Self-pay | Source: Ambulatory Visit | Attending: Gastroenterology

## 2016-07-25 ENCOUNTER — Encounter: Payer: Self-pay | Admitting: Internal Medicine

## 2016-07-25 ENCOUNTER — Telehealth: Payer: Self-pay | Admitting: Internal Medicine

## 2016-07-25 ENCOUNTER — Ambulatory Visit (HOSPITAL_COMMUNITY)
Admission: RE | Admit: 2016-07-25 | Discharge: 2016-07-25 | Disposition: A | Discharge status: Home / Self Care | Payer: Commercial Managed Care - PPO | Source: Ambulatory Visit | Attending: Gastroenterology | Admitting: Gastroenterology

## 2016-07-25 ENCOUNTER — Ambulatory Visit (HOSPITAL_COMMUNITY): Payer: Commercial Managed Care - PPO | Admitting: Anesthesiology

## 2016-07-25 ENCOUNTER — Encounter (HOSPITAL_COMMUNITY): Payer: Self-pay

## 2016-07-25 VITALS — BP 126/82 | HR 92 | Ht 61.5 in | Wt 293.0 lb

## 2016-07-25 DIAGNOSIS — J45909 Unspecified asthma, uncomplicated: Secondary | ICD-10-CM

## 2016-07-25 DIAGNOSIS — K219 Gastro-esophageal reflux disease without esophagitis: Secondary | ICD-10-CM | POA: Insufficient documentation

## 2016-07-25 DIAGNOSIS — Z90710 Acquired absence of both cervix and uterus: Secondary | ICD-10-CM

## 2016-07-25 DIAGNOSIS — Z79899 Other long term (current) drug therapy: Secondary | ICD-10-CM | POA: Insufficient documentation

## 2016-07-25 DIAGNOSIS — Z96652 Presence of left artificial knee joint: Secondary | ICD-10-CM | POA: Insufficient documentation

## 2016-07-25 DIAGNOSIS — Z91040 Latex allergy status: Secondary | ICD-10-CM

## 2016-07-25 DIAGNOSIS — I10 Essential (primary) hypertension: Secondary | ICD-10-CM | POA: Insufficient documentation

## 2016-07-25 DIAGNOSIS — F329 Major depressive disorder, single episode, unspecified: Secondary | ICD-10-CM

## 2016-07-25 DIAGNOSIS — R011 Cardiac murmur, unspecified: Secondary | ICD-10-CM

## 2016-07-25 DIAGNOSIS — R300 Dysuria: Secondary | ICD-10-CM

## 2016-07-25 DIAGNOSIS — K5730 Diverticulosis of large intestine without perforation or abscess without bleeding: Secondary | ICD-10-CM

## 2016-07-25 DIAGNOSIS — Z8719 Personal history of other diseases of the digestive system: Secondary | ICD-10-CM

## 2016-07-25 DIAGNOSIS — Z1211 Encounter for screening for malignant neoplasm of colon: Secondary | ICD-10-CM

## 2016-07-25 DIAGNOSIS — Z86010 Personal history of colonic polyps: Secondary | ICD-10-CM | POA: Insufficient documentation

## 2016-07-25 DIAGNOSIS — Z91013 Allergy to seafood: Secondary | ICD-10-CM

## 2016-07-25 DIAGNOSIS — Z885 Allergy status to narcotic agent status: Secondary | ICD-10-CM | POA: Insufficient documentation

## 2016-07-25 DIAGNOSIS — F419 Anxiety disorder, unspecified: Secondary | ICD-10-CM | POA: Insufficient documentation

## 2016-07-25 DIAGNOSIS — D125 Benign neoplasm of sigmoid colon: Secondary | ICD-10-CM

## 2016-07-25 DIAGNOSIS — M1711 Unilateral primary osteoarthritis, right knee: Secondary | ICD-10-CM | POA: Insufficient documentation

## 2016-07-25 DIAGNOSIS — D123 Benign neoplasm of transverse colon: Secondary | ICD-10-CM

## 2016-07-25 DIAGNOSIS — E785 Hyperlipidemia, unspecified: Secondary | ICD-10-CM | POA: Insufficient documentation

## 2016-07-25 DIAGNOSIS — Z87891 Personal history of nicotine dependence: Secondary | ICD-10-CM

## 2016-07-25 DIAGNOSIS — Z6841 Body Mass Index (BMI) 40.0 and over, adult: Secondary | ICD-10-CM

## 2016-07-25 HISTORY — PX: COLONOSCOPY WITH PROPOFOL: SHX5780

## 2016-07-25 LAB — URINALYSIS, ROUTINE W REFLEX MICROSCOPIC
Ketones, ur: NEGATIVE
Leukocytes, UA: NEGATIVE
Nitrite: NEGATIVE
Specific Gravity, Urine: >=1.03 — AB (ref 1.000–1.030)
Total Protein, Urine: NEGATIVE
Urine Glucose: NEGATIVE
Urobilinogen, UA: 0.2 (ref 0.0–1.0)
WBC, UA: NONE SEEN (ref 0–?)
pH: 5.5 (ref 5.0–8.0)

## 2016-07-25 SURGERY — COLONOSCOPY WITH PROPOFOL
Anesthesia: Monitor Anesthesia Care

## 2016-07-25 MED ORDER — PHENAZOPYRIDINE HCL 100 MG PO TABS: 100.0000 mg | ORAL_TABLET | Freq: Three times a day (TID) | ORAL | 1 refills | Status: DC | PRN

## 2016-07-25 MED ORDER — PROPOFOL 500 MG/50ML IV EMUL
INTRAVENOUS | Status: DC | PRN
  Administered 2016-07-25: 20 mg via INTRAVENOUS
  Administered 2016-07-25: 30 mg via INTRAVENOUS
  Administered 2016-07-25: 20 mg via INTRAVENOUS
  Administered 2016-07-25: 40 mg via INTRAVENOUS
  Administered 2016-07-25: 20 mg via INTRAVENOUS
  Administered 2016-07-25: 40 mg via INTRAVENOUS
  Administered 2016-07-25: 50 mg via INTRAVENOUS
  Administered 2016-07-25 (×3): 20 mg via INTRAVENOUS

## 2016-07-25 MED ORDER — ESTROGENS, CONJUGATED 0.625 MG/GM VA CREA: 1.0000 | TOPICAL_CREAM | Freq: Every day | VAGINAL | 12 refills | Status: DC

## 2016-07-25 MED ORDER — PROPOFOL 10 MG/ML IV BOLUS
INTRAVENOUS | Status: AC
  Filled 2016-07-25: qty 20

## 2016-07-25 MED ORDER — HYDROCODONE-ACETAMINOPHEN 5-325 MG PO TABS: 1.0000 | ORAL_TABLET | Freq: Four times a day (QID) | ORAL | 0 refills | Status: DC | PRN

## 2016-07-25 MED ORDER — LACTATED RINGERS IV SOLN
INTRAVENOUS | Status: DC
  Administered 2016-07-25: 1000 mL via INTRAVENOUS

## 2016-07-25 MED ORDER — PROPOFOL 10 MG/ML IV BOLUS
INTRAVENOUS | Status: AC
  Filled 2016-07-25: qty 40

## 2016-07-25 MED ORDER — SODIUM CHLORIDE 0.9 % IV SOLN: INTRAVENOUS | Status: DC

## 2016-07-25 MED ORDER — PROPOFOL 500 MG/50ML IV EMUL
INTRAVENOUS | Status: DC | PRN
  Administered 2016-07-25: 150 ug/kg/min via INTRAVENOUS

## 2016-07-25 SURGICAL SUPPLY — 22 items

## 2016-07-25 NOTE — H&P (Signed)
[name] HPI: At this time the patient denies any problems with nausea, vomiting, fevers, chills, abdominal pain, diarrhea, melena, GERD, or dysphagia. The patient denies any known family history of colon cancers. No complaints of chest pain, SOB, MI, or sleep apnea. Recently she reports some issues with constiaption for the past 6 months and with a harder bowel movement she has minor hematochezia.  Past Medical History:  Diagnosis Date  . Alcohol abuse    abuse- moderate years ago - states only drinks now on weekends -2 to 8 driinks   . Asthma   . COLONIC POLYPS, HX OF 04/05/2010  . DIVERTICULITIS, HX OF 04/05/2010  . DJD (degenerative joint disease)    right knee, mot to severe  . GERD (gastroesophageal reflux disease)    no meds  . Heart murmur    hx of   . Hyperlipidemia   . Hypertension   . Impaired glucose tolerance 12/06/2013  . Obesity (BMI 30-39.9)   . PALPITATIONS, HX OF 09/14/2007    Past Surgical History:  Procedure Laterality Date  . ABDOMINAL HYSTERECTOMY    . colonscopy     x 2  . KNEE ARTHROSCOPY     left   . TOTAL KNEE ARTHROPLASTY  07/29/2011   Procedure: TOTAL KNEE ARTHROPLASTY;  Surgeon: Mauri Pole, MD;  Location: [facility];  Service: Orthopedics;  Laterality: Right;  . TOTAL KNEE ARTHROPLASTY Left 12/14/2012   Procedure: LEFT TOTAL KNEE ARTHROPLASTY;  Surgeon: Mauri Pole, MD;  Location: [facility];  Service: Orthopedics;  Laterality: Left;  Marland Kitchen VESICOVAGINAL FISTULA CLOSURE W/ TAH      Family History  Problem Relation Age of Onset  . Stroke Mother   . COPD Father   . Lymphoma Sister     Social History:  reports that she quit smoking about 28 years ago. Her smoking use included Cigarettes. She has a 3.50 pack-year smoking history. She has never used smokeless tobacco. She reports that she does not drink alcohol or use drugs.  Allergies:  Allergies  Allergen Reactions  . Morphine Itching  . Shrimp [Shellfish Allergy] Itching    Tongue burns  .  Tramadol Other (See Comments)    confusion  . Latex Rash    Medications:  Scheduled:  Continuous: . sodium chloride    . lactated ringers      Results for orders placed or performed in visit on 07/25/16 (from the past 24 hour(s))  Urinalysis, Routine w reflex microscopic     Status: Abnormal   Collection Time: 07/25/16  9:05 AM  Result Value Ref Range   Color, Urine YELLOW Yellow;Lt. Yellow   APPearance CLEAR Clear   Specific Gravity, Urine >=1.030 (A) 1.000 - 1.030   pH 5.5 5.0 - 8.0   Total Protein, Urine NEGATIVE Negative   Urine Glucose NEGATIVE Negative   Ketones, ur NEGATIVE Negative   Bilirubin Urine SMALL (A) Negative   Hgb urine dipstick TRACE-INTACT (A) Negative   Urobilinogen, UA 0.2 0.0 - 1.0   Leukocytes, UA NEGATIVE Negative   Nitrite NEGATIVE Negative   WBC, UA none seen 0-2/hpf   RBC / HPF 0-2/hpf 0-2/hpf     No results found.  ROS:  As stated above in the HPI otherwise negative.  Blood pressure 136/85, pulse 96, temperature 98.9 F (37.2 C), temperature source Oral, resp. rate (!) 21, SpO2 97 %.    PE: Gen: NAD, Alert and Oriented HEENT:  [place]/AT, EOMI Neck: Supple, no LAD Lungs: CTA  Bilaterally CV: RRR without M/G/R ABM: Soft, NTND, +BS Ext: No C/C/E  Assessment/Plan: 1) Screening colon.  Adelaido Nicklaus D 07/25/2016, 10:06 AM

## 2016-07-25 NOTE — Anesthesia Postprocedure Evaluation (Signed)
Anesthesia Post Note  Patient: [name]  Procedure(s) Performed: Procedure(s) (LRB): COLONOSCOPY WITH PROPOFOL (N/A)     Patient location during evaluation: PACU Anesthesia Type: MAC Level of consciousness: awake and alert Pain management: pain level controlled Vital Signs Assessment: post-procedure vital signs reviewed and stable Respiratory status: spontaneous breathing, nonlabored ventilation and respiratory function stable Cardiovascular status: stable and blood pressure returned to baseline Anesthetic complications: no    Last Vitals:  Vitals:   07/25/16 1145 07/25/16 1149  BP:  130/88  Pulse: 96 97  Resp: (!) 22 (!) 27  Temp:      Last Pain:  Vitals:   07/25/16 1140  TempSrc:   PainSc: [name]

## 2016-07-25 NOTE — Telephone Encounter (Signed)
Informed pt and she will check with her pharmacist.

## 2016-07-25 NOTE — Telephone Encounter (Signed)
Not sure what to say, since I am not sure of how her insurance pays for which meds  OK for pt to ask pharmacist regarding is there any similar cream to prescribe instead?

## 2016-07-25 NOTE — Op Note (Addendum)
[facility] Patient Name: [name] Procedure Date: 07/25/2016 MRN: [id number] Attending MD: Carol Ada , MD Date of Birth: Mar 27, 1960 CSN: [id number] Age: 56 Admit Type: Outpatient Procedure:                Colonoscopy Indications:              Screening for colorectal malignant neoplasm Providers:                Carol Ada, MD, Cleda Daub, RN, Cherylynn Ridges,                            Technician, Rosario Adie, CRNA Referring MD:              Medicines:                Propofol per Anesthesia Complications:            No immediate complications. Estimated Blood Loss:     Estimated blood loss was minimal. Procedure:                Pre-Anesthesia Assessment:                           - Prior to the procedure, a History and Physical                            was performed, and patient medications and                            allergies were reviewed. The patient's tolerance of                            previous anesthesia was also reviewed. The risks                            and benefits of the procedure and the sedation                            options and risks were discussed with the patient.                            All questions were answered, and informed consent                            was obtained. Prior Anticoagulants: The patient has                            taken no previous anticoagulant or antiplatelet                            agents. ASA Grade Assessment: III - A patient with                            severe systemic disease. After reviewing the risks  and benefits, the patient was deemed in                            satisfactory condition to undergo the procedure.                           - Sedation was administered by an anesthesia                            professional. Deep sedation was attained.                           After obtaining informed consent, the colonoscope         was passed under direct vision. Throughout the                            procedure, the patient's blood pressure, pulse, and                            oxygen saturations were monitored continuously. The                            EC-[id number] ([id number]) scope was introduced through                            the anus and advanced to the the cecum, identified                            by appendiceal orifice and ileocecal valve. The                            colonoscopy was technically difficult and complex.                            The patient tolerated the procedure well. The                            quality of the bowel preparation was good. The                            ileocecal valve, appendiceal orifice, and rectum                            were photographed. Scope In: 10:39:16 AM Scope Out: 11:09:52 AM Scope Withdrawal Time: 0 hours 26 minutes 31 seconds  Total Procedure Duration: 0 hours 30 minutes 36 seconds  Findings:      Two pedunculated polyps were found in the sigmoid colon and transverse       colon. The polyps were 5 to 10 mm in size. These polyps were removed       with a hot snare. Resection and retrieval were complete. To prevent       bleeding post-intervention, one hemostatic clip was successfully placed       (MR unsafe). There was no bleeding at the end of the procedure.  Three sessile polyps were found in the descending colon and transverse       colon. The polyps were 3 to 4 mm in size. These polyps were removed with       a cold snare. Resection and retrieval were complete.      There was a large lipoma, in the recto-sigmoid colon.      Scattered small and large-mouthed diverticula were found in the sigmoid       colon, descending colon and transverse colon.      Removal of the peduculated sigmoid colon polyp was very difficult. The       patient's deep respirations and wretching made the polyp a moving target       that floated proximally and  distally on its long stalk. With persistence       the polyp was captured and slowly removed with a coagulation current. I       opted to prophlax with a hemoclip because of the larger polyp size and       the marked difficulty with resection. If bleeding occurred, achieving       hemostatsis would be extremely difficult. Impression:               - Two 5 to 10 mm polyps in the sigmoid colon and in                            the transverse colon, removed with a hot snare.                            Resected and retrieved. Clip (MR unsafe) was placed.                           - Three 3 to 4 mm polyps in the descending colon                            and in the transverse colon, removed with a cold                            snare. Resected and retrieved.                           - Large lipoma in the recto-sigmoid colon.                           - Diverticulosis in the sigmoid colon, in the                            descending colon and in the transverse colon. Moderate Sedation:      N/A- Per Anesthesia Care Recommendation:           - Patient has a contact number available for                            emergencies. The signs and symptoms of potential                            delayed complications were discussed with  the                            patient. Return to normal activities tomorrow.                            Written discharge instructions were provided to the                            patient.                           - Resume previous diet.                           - Continue present medications.                           - Await pathology results.                           - Repeat colonoscopy in 3 years for surveillance. Procedure Code(s):        --- Professional ---                           [phone], Colonoscopy, flexible; with removal of                            tumor(s), polyp(s), or other lesion(s) by snare                            technique Diagnosis  Code(s):        --- Professional ---                           Z12.11, Encounter for screening for malignant                            neoplasm of colon                           D12.5, Benign neoplasm of sigmoid colon                           D12.4, Benign neoplasm of descending colon                           D12.3, Benign neoplasm of transverse colon (hepatic                            flexure or splenic flexure)                           D17.5, Benign lipomatous neoplasm of                            intra-abdominal organs  K57.30, Diverticulosis of large intestine without                            perforation or abscess without bleeding CPT copyright 2016 [employer]. All rights reserved. The codes documented in this report are preliminary and upon coder review may  be revised to meet current compliance requirements. Carol Ada, MD Carol Ada, MD 07/25/2016 11:21:33 AM This report has been signed electronically. Number of Addenda: 0

## 2016-07-25 NOTE — Anesthesia Preprocedure Evaluation (Addendum)
Anesthesia Evaluation  Patient identified by MRN, date of birth, ID band Patient awake    Reviewed: Allergy & Precautions, H&P , NPO status , Patient's Chart, lab work & pertinent test results  Airway Mallampati: II  TM Distance: >3 FB Neck ROM: full    Dental no notable dental hx. (+) Teeth Intact, Dental Advisory Given   Pulmonary asthma , former smoker,  Vocal cord disorder   Pulmonary exam normal breath sounds clear to auscultation       Cardiovascular hypertension, Pt. on medications Normal cardiovascular exam Rhythm:regular Rate:Normal  palpitations   Neuro/Psych Anxiety Depression negative neurological ROS  negative psych ROS   GI/Hepatic negative GI ROS, Neg liver ROS, GERD  ,  Endo/Other  negative endocrine ROSMorbid obesitygoiter  Renal/GU negative Renal ROS  negative genitourinary   Musculoskeletal  (+) Arthritis ,   Abdominal (+) + obese,   Peds  Hematology negative hematology ROS (+)   Anesthesia Other Findings   Reproductive/Obstetrics negative OB ROS                             Anesthesia Physical  Anesthesia Plan  ASA: III  Anesthesia Plan: MAC   Post-op Pain Management:    Induction:   PONV Risk Score and Plan: 2 and Ondansetron and Midazolam  Airway Management Planned:   Additional Equipment:   Intra-op Plan:   Post-operative Plan:   Informed Consent: I have reviewed the patients History and Physical, chart, labs and discussed the procedure including the risks, benefits and alternatives for the proposed anesthesia with the patient or authorized representative who has indicated his/her understanding and acceptance.     Plan Discussed with: CRNA and Surgeon  Anesthesia Plan Comments:         Anesthesia Quick Evaluation

## 2016-07-25 NOTE — Progress Notes (Signed)
Subjective:    Patient ID: [name], female    DOB: 02-25-1960, 56 y.o.   MRN: [id number]  HPI  Here after being sen at [facility] for possible UTI, tx with one course of antibx that did not help, then on f/u urine cx was noted neg from first visit.  Still c/o burning discomfort with urination.  Denies urinary symptoms such as frequency, urgency, flank pain, hematuria or n/v, fever, chills.  Denies worsening reflux, abd pain, dysphagia, n/v, bowel change or blood.  Pain currently 7/10, intermittent but persistent Past Medical History:  Diagnosis Date  . Alcohol abuse    abuse- moderate years ago - states only drinks now on weekends -2 to 8 driinks   . Asthma   . COLONIC POLYPS, HX OF 04/05/2010  . DIVERTICULITIS, HX OF 04/05/2010  . DJD (degenerative joint disease)    right knee, mot to severe  . GERD (gastroesophageal reflux disease)    no meds  . Heart murmur    hx of   . Hyperlipidemia   . Hypertension   . Impaired glucose tolerance 12/06/2013  . Obesity (BMI 30-39.9)   . PALPITATIONS, HX OF 09/14/2007   Past Surgical History:  Procedure Laterality Date  . ABDOMINAL HYSTERECTOMY    . colonscopy     x 2  . KNEE ARTHROSCOPY     left   . TOTAL KNEE ARTHROPLASTY  07/29/2011   Procedure: TOTAL KNEE ARTHROPLASTY;  Surgeon: Mauri Pole, MD;  Location: [facility];  Service: Orthopedics;  Laterality: Right;  . TOTAL KNEE ARTHROPLASTY Left 12/14/2012   Procedure: LEFT TOTAL KNEE ARTHROPLASTY;  Surgeon: Mauri Pole, MD;  Location: [facility];  Service: Orthopedics;  Laterality: Left;  Marland Kitchen VESICOVAGINAL FISTULA CLOSURE W/ TAH      reports that she quit smoking about 28 years ago. Her smoking use included Cigarettes. She has a 3.50 pack-year smoking history. She has never used smokeless tobacco. She reports that she does not drink alcohol or use drugs. family history includes COPD in her father; Lymphoma in her sister; Stroke in her mother. Allergies  Allergen Reactions  . Morphine Itching  .  Shrimp [Shellfish Allergy] Itching    Tongue burns  . Tramadol Other (See Comments)    confusion  . Latex Rash   Current Outpatient Prescriptions on File Prior to Visit  Medication Sig Dispense Refill  . albuterol (PROVENTIL HFA;VENTOLIN HFA) 108 (90 Base) MCG/ACT inhaler Inhale 1-2 puffs into the lungs every 6 (six) hours as needed for wheezing. 1 Inhaler 0  . atorvastatin (LIPITOR) 10 MG tablet Take 1 tablet (10 mg total) by mouth daily. 90 tablet 3  . Fluticasone-Salmeterol (ADVAIR DISKUS) 250-50 MCG/DOSE AEPB Inhale 1 puff into the lungs 2 (two) times daily. 3 each 2  . losartan-hydrochlorothiazide (HYZAAR) 100-25 MG tablet Take 1 tablet by mouth every morning. 90 tablet 3  . ibuprofen (ADVIL,MOTRIN) 600 MG tablet Take 300 mg by mouth 2 (two) times daily as needed for moderate pain.    . Polyvinyl Alcohol-Povidone (CLEAR EYES ALL SEASONS) 5-6 MG/ML SOLN Place 2 drops into both eyes daily.    Marland Kitchen terbinafine (LAMISIL) 250 MG tablet Take 250 mg by mouth daily. continuous     No current facility-administered medications on file prior to visit.    Review of Systems  Constitutional: Negative for other unusual diaphoresis or sweats HENT: Negative for ear discharge or swelling Eyes: Negative for other worsening visual disturbances Respiratory: Negative for stridor or other  swelling  Gastrointestinal: Negative for worsening distension or other blood Genitourinary: Negative for retention or other urinary change Musculoskeletal: Negative for other MSK pain or swelling Skin: Negative for color change or other new lesions Neurological: Negative for worsening tremors and other numbness  Psychiatric/Behavioral: Negative for worsening agitation or other fatigue All other system neg per pt    Objective:   Physical Exam BP 126/82   Pulse 92   Ht 5' 1.5" (1.562 m)   Wt 293 lb (132.9 kg)   SpO2 99%   BMI 54.47 kg/m  VS noted,  Constitutional: Pt appears in NAD HENT: Head: NCAT.  Right Ear:  External ear normal.  Left Ear: External ear normal.  Eyes: . Pupils are equal, round, and reactive to light. Conjunctivae and EOM are normal Nose: without d/c or deformity Neck: Neck supple. Gross normal ROM Cardiovascular: Normal rate and regular rhythm.   Pulmonary/Chest: Effort normal and breath sounds without rales or wheezing.  Abd:  Soft, NT, ND, + BS, no organomegaly Neurological: Pt is alert. At baseline orientation, motor grossly intact Skin: Skin is warm. No rashes, other new lesions, no LE edema Psychiatric: Pt behavior is normal without agitation  No other exam findings    Assessment & Plan:

## 2016-07-25 NOTE — Patient Instructions (Addendum)
Please take all new medication as prescribed - the pain medication if needed, and the Hormone cream  OK to take OTC AZO for pain as needed, or the Pyridium as needed (sent as prescription to the pharmacy)(  Please continue all other medications as before, and refills have been done if requested.  Please have the pharmacy call with any other refills you may need.  Please keep your appointments with your specialists as you may have planned  You will be contacted regarding the referral for: Urology

## 2016-07-25 NOTE — Telephone Encounter (Signed)
Pt cannot afford conjugated estrogens (PREMARIN) vaginal cream   Can something else be sent in , please advise   Please send to same pharmacy

## 2016-07-25 NOTE — Transfer of Care (Signed)
Immediate Anesthesia Transfer of Care Note  Patient: [name]  Procedure(s) Performed: Procedure(s): COLONOSCOPY WITH PROPOFOL (N/A)  Patient Location: PACU  Anesthesia Type:MAC  Level of Consciousness: awake, alert  and oriented  Airway & Oxygen Therapy: Patient Spontanous Breathing and Patient connected to face mask oxygen  Post-op Assessment: Report given to RN and Post -op Vital signs reviewed and stable  Post vital signs: Reviewed and stable  Last Vitals:  Vitals:   07/25/16 1000  BP: 136/85  Pulse: 96  Resp: (!) 21  Temp: 37.2 C    Last Pain:  Vitals:   07/25/16 1000  TempSrc: Oral  PainSc: 8          Complications: No apparent anesthesia complications

## 2016-07-25 NOTE — Discharge Instructions (Signed)

## 2016-07-26 LAB — URINE CULTURE

## 2016-07-26 NOTE — Assessment & Plan Note (Addendum)
Etiology unclear, exam not c/w cystitis, Other possible causes of urinary discomfort include chafing; irritation from hygiene product; other pelvic infection (yeast, BV) or STD; occasional dietary cause (caffeine); bowel issue; low estrogen effect; kidney stone passage; or interstitial cystitis. D/w pt; for repeat UA and Cx, urology referral, trial estrogen cream, azo or pyridium prn, and limited rx hydrocodone prn given for short term use only  Consider GYN eval.

## 2016-07-28 ENCOUNTER — Telehealth: Payer: Self-pay | Admitting: Internal Medicine

## 2016-07-28 ENCOUNTER — Inpatient Hospital Stay (HOSPITAL_COMMUNITY)
Admission: EM | Admit: 2016-07-28 | Discharge: 2016-08-02 | DRG: 668 | Disposition: A | Discharge status: Home / Self Care | Payer: Commercial Managed Care - PPO | Attending: Family Medicine | Admitting: Family Medicine

## 2016-07-28 ENCOUNTER — Encounter (HOSPITAL_COMMUNITY): Payer: Self-pay | Admitting: Emergency Medicine

## 2016-07-28 ENCOUNTER — Emergency Department (HOSPITAL_COMMUNITY): Payer: Commercial Managed Care - PPO

## 2016-07-28 DIAGNOSIS — N179 Acute kidney failure, unspecified: Secondary | ICD-10-CM | POA: Diagnosis not present

## 2016-07-28 DIAGNOSIS — E876 Hypokalemia: Secondary | ICD-10-CM | POA: Diagnosis present

## 2016-07-28 DIAGNOSIS — Z6841 Body Mass Index (BMI) 40.0 and over, adult: Secondary | ICD-10-CM | POA: Diagnosis not present

## 2016-07-28 DIAGNOSIS — N151 Renal and perinephric abscess: Secondary | ICD-10-CM | POA: Diagnosis present

## 2016-07-28 DIAGNOSIS — N3080 Other cystitis without hematuria: Secondary | ICD-10-CM

## 2016-07-28 DIAGNOSIS — N321 Vesicointestinal fistula: Secondary | ICD-10-CM | POA: Diagnosis not present

## 2016-07-28 DIAGNOSIS — K5730 Diverticulosis of large intestine without perforation or abscess without bleeding: Secondary | ICD-10-CM | POA: Diagnosis not present

## 2016-07-28 DIAGNOSIS — T502X5A Adverse effect of carbonic-anhydrase inhibitors, benzothiadiazides and other diuretics, initial encounter: Secondary | ICD-10-CM | POA: Diagnosis present

## 2016-07-28 DIAGNOSIS — Z79899 Other long term (current) drug therapy: Secondary | ICD-10-CM | POA: Diagnosis not present

## 2016-07-28 DIAGNOSIS — E669 Obesity, unspecified: Secondary | ICD-10-CM | POA: Diagnosis not present

## 2016-07-28 DIAGNOSIS — K5720 Diverticulitis of large intestine with perforation and abscess without bleeding: Secondary | ICD-10-CM | POA: Diagnosis present

## 2016-07-28 DIAGNOSIS — Z87891 Personal history of nicotine dependence: Secondary | ICD-10-CM

## 2016-07-28 DIAGNOSIS — L0291 Cutaneous abscess, unspecified: Secondary | ICD-10-CM

## 2016-07-28 DIAGNOSIS — F1010 Alcohol abuse, uncomplicated: Secondary | ICD-10-CM | POA: Diagnosis present

## 2016-07-28 DIAGNOSIS — Z96653 Presence of artificial knee joint, bilateral: Secondary | ICD-10-CM | POA: Diagnosis present

## 2016-07-28 DIAGNOSIS — I10 Essential (primary) hypertension: Secondary | ICD-10-CM | POA: Diagnosis present

## 2016-07-28 DIAGNOSIS — R509 Fever, unspecified: Secondary | ICD-10-CM | POA: Diagnosis not present

## 2016-07-28 DIAGNOSIS — K219 Gastro-esophageal reflux disease without esophagitis: Secondary | ICD-10-CM | POA: Diagnosis present

## 2016-07-28 DIAGNOSIS — N200 Calculus of kidney: Secondary | ICD-10-CM | POA: Diagnosis present

## 2016-07-28 DIAGNOSIS — E785 Hyperlipidemia, unspecified: Secondary | ICD-10-CM | POA: Diagnosis present

## 2016-07-28 DIAGNOSIS — J45909 Unspecified asthma, uncomplicated: Secondary | ICD-10-CM | POA: Diagnosis present

## 2016-07-28 DIAGNOSIS — B9620 Unspecified Escherichia coli [E. coli] as the cause of diseases classified elsewhere: Secondary | ICD-10-CM | POA: Diagnosis present

## 2016-07-28 HISTORY — DX: Other cystitis without hematuria: N30.80

## 2016-07-28 HISTORY — DX: Alcohol abuse, uncomplicated: F10.10

## 2016-07-28 LAB — PHOSPHORUS: Phosphorus: 3.1 mg/dL (ref 2.5–4.6)

## 2016-07-28 LAB — HEPATIC FUNCTION PANEL
ALT: 21 U/L (ref 14–54)
AST: 29 U/L (ref 15–41)
Albumin: 3.1 g/dL — ABNORMAL LOW (ref 3.5–5.0)
Alkaline Phosphatase: 89 U/L (ref 38–126)
Bilirubin, Direct: 0.2 mg/dL (ref 0.1–0.5)
Indirect Bilirubin: 0.3 mg/dL (ref 0.3–0.9)
Total Bilirubin: 0.5 mg/dL (ref 0.3–1.2)
Total Protein: 6.9 g/dL (ref 6.5–8.1)

## 2016-07-28 LAB — CBC
HCT: 41.6 % (ref 36.0–46.0)
Hemoglobin: 13.4 g/dL (ref 12.0–15.0)
MCH: 29.1 pg (ref 26.0–34.0)
MCHC: 32.2 g/dL (ref 30.0–36.0)
MCV: 90.4 fL (ref 78.0–100.0)
Platelets: 339 10*3/uL (ref 150–400)
RBC: 4.6 MIL/uL (ref 3.87–5.11)
RDW: 13.6 % (ref 11.5–15.5)
WBC: 16.9 10*3/uL — ABNORMAL HIGH (ref 4.0–10.5)

## 2016-07-28 LAB — BASIC METABOLIC PANEL
Anion gap: 11 (ref 5–15)
BUN: 11 mg/dL (ref 6–20)
CO2: 27 mmol/L (ref 22–32)
Calcium: 9 mg/dL (ref 8.9–10.3)
Chloride: 101 mmol/L (ref 101–111)
Creatinine, Ser: 0.82 mg/dL (ref 0.44–1.00)
GFR calc Af Amer: >60 mL/min (ref >60)
GFR calc non Af Amer: >60 mL/min (ref >60)
Glucose, Bld: 98 mg/dL (ref 65–99)
Potassium: 3 mmol/L — ABNORMAL LOW (ref 3.5–5.1)
Sodium: 139 mmol/L (ref 135–145)

## 2016-07-28 LAB — URINALYSIS, ROUTINE W REFLEX MICROSCOPIC
Bilirubin Urine: NEGATIVE
Glucose, UA: NEGATIVE mg/dL
Hgb urine dipstick: NEGATIVE
Ketones, ur: NEGATIVE mg/dL
Leukocytes, UA: NEGATIVE
Nitrite: NEGATIVE
Protein, ur: 100 mg/dL — AB
Specific Gravity, Urine: 1.02 (ref 1.005–1.030)
pH: 5 (ref 5.0–8.0)

## 2016-07-28 LAB — PROTIME-INR
INR: 1.11
Prothrombin Time: 14.4 seconds (ref 11.4–15.2)

## 2016-07-28 LAB — APTT: aPTT: 29 seconds (ref 24–36)

## 2016-07-28 LAB — MAGNESIUM: Magnesium: 2.1 mg/dL (ref 1.7–2.4)

## 2016-07-28 LAB — LIPASE, BLOOD: Lipase: 20 U/L (ref 11–51)

## 2016-07-28 MED ORDER — SODIUM CHLORIDE 0.9 % IV SOLN
INTRAVENOUS | Status: DC
  Administered 2016-07-28 – 2016-07-30 (×2): via INTRAVENOUS

## 2016-07-28 MED ORDER — HYDROCODONE-ACETAMINOPHEN 5-325 MG PO TABS
2.0000 | ORAL_TABLET | Freq: Once | ORAL | Status: AC
  Administered 2016-07-28: 2 via ORAL
  Filled 2016-07-28: qty 2

## 2016-07-28 MED ORDER — SODIUM CHLORIDE 0.9 % IV BOLUS (SEPSIS)
1000.0000 mL | Freq: Once | INTRAVENOUS | Status: AC
  Administered 2016-07-28: 1000 mL via INTRAVENOUS

## 2016-07-28 MED ORDER — PIPERACILLIN-TAZOBACTAM 3.375 G IVPB
3.3750 g | Freq: Three times a day (TID) | INTRAVENOUS | Status: DC
  Administered 2016-07-28 – 2016-07-31 (×10): 3.375 g via INTRAVENOUS
  Filled 2016-07-28 (×12): qty 50

## 2016-07-28 MED ORDER — ALBUTEROL SULFATE HFA 108 (90 BASE) MCG/ACT IN AERS
1.0000 | INHALATION_SPRAY | Freq: Four times a day (QID) | RESPIRATORY_TRACT | Status: DC | PRN
  Filled 2016-07-28: qty 6.7

## 2016-07-28 MED ORDER — IOPAMIDOL (ISOVUE-300) INJECTION 61%
100.0000 mL | Freq: Once | INTRAVENOUS | Status: AC | PRN
  Administered 2016-07-28: 100 mL via INTRAVENOUS

## 2016-07-28 MED ORDER — MOMETASONE FURO-FORMOTEROL FUM 200-5 MCG/ACT IN AERO
2.0000 | INHALATION_SPRAY | Freq: Two times a day (BID) | RESPIRATORY_TRACT | Status: DC
  Filled 2016-07-28: qty 8.8

## 2016-07-28 MED ORDER — ENOXAPARIN SODIUM 40 MG/0.4ML ~~LOC~~ SOLN: 40.0000 mg | Freq: Once | SUBCUTANEOUS | Status: DC

## 2016-07-28 MED ORDER — POTASSIUM CHLORIDE CRYS ER 20 MEQ PO TBCR
40.0000 meq | EXTENDED_RELEASE_TABLET | Freq: Once | ORAL | Status: AC
  Administered 2016-07-28: 40 meq via ORAL
  Filled 2016-07-28: qty 2

## 2016-07-28 MED ORDER — PIPERACILLIN-TAZOBACTAM 3.375 G IVPB
3.3750 g | Freq: Once | INTRAVENOUS | Status: AC
  Administered 2016-07-28: 3.375 g via INTRAVENOUS
  Filled 2016-07-28: qty 50

## 2016-07-28 MED ORDER — OXYCODONE HCL 5 MG PO TABS
5.0000 mg | ORAL_TABLET | ORAL | Status: DC | PRN
  Administered 2016-07-28: 5 mg via ORAL
  Administered 2016-07-28 – 2016-07-30 (×7): 10 mg via ORAL
  Filled 2016-07-28 (×3): qty 2
  Filled 2016-07-28: qty 1
  Filled 2016-07-28 (×4): qty 2

## 2016-07-28 MED ORDER — ALBUTEROL SULFATE (2.5 MG/3ML) 0.083% IN NEBU: 2.5000 mg | INHALATION_SOLUTION | Freq: Four times a day (QID) | RESPIRATORY_TRACT | Status: DC | PRN

## 2016-07-28 MED ORDER — PIPERACILLIN-TAZOBACTAM 3.375 G IVPB 30 MIN
3.3750 g | Freq: Three times a day (TID) | INTRAVENOUS | Status: DC
  Filled 2016-07-28 (×2): qty 50

## 2016-07-28 MED ORDER — KETOROLAC TROMETHAMINE 30 MG/ML IJ SOLN
15.0000 mg | Freq: Once | INTRAMUSCULAR | Status: AC
Start: 2016-07-28 — End: 2016-07-28
  Administered 2016-07-28: 15 mg via INTRAVENOUS
  Filled 2016-07-28: qty 1

## 2016-07-28 MED ORDER — MORPHINE SULFATE (PF) 4 MG/ML IV SOLN: 1.0000 mg | INTRAVENOUS | Status: DC | PRN

## 2016-07-28 MED ORDER — ENOXAPARIN SODIUM 40 MG/0.4ML ~~LOC~~ SOLN
40.0000 mg | SUBCUTANEOUS | Status: DC
  Filled 2016-07-28 (×2): qty 0.4

## 2016-07-28 MED ORDER — LOSARTAN POTASSIUM 50 MG PO TABS
100.0000 mg | ORAL_TABLET | Freq: Every day | ORAL | Status: DC
  Administered 2016-07-29 – 2016-08-02 (×5): 100 mg via ORAL
  Filled 2016-07-28 (×6): qty 2

## 2016-07-28 NOTE — Telephone Encounter (Signed)
Unfortunately, I am not able to call at this time, as I should let the current attending MD in the hospital be the primary contact during the hospn.  If there is a specific question, I can try to answer later, or this can be d/w pt at the next visit.

## 2016-07-28 NOTE — ED Notes (Signed)
Pt provided with meal tray, clear liquid

## 2016-07-28 NOTE — Consult Note (Signed)
Chief Complaint: Patient was seen in consultation today for bladder wall abscess drain placement Chief Complaint  Patient presents with  . Back Pain  . Dysuria   at the request of Dr Kirby Funk  Referring Physician(s): Dr Vanita Panda [facility]  Supervising Physician: Marybelle Killings  Patient Status: [facility] - ED  History of Present Illness: [name] is a 56 y.o. female   Painful urination x 1 mo Worsening pain Presented to ED today Pain is mostly in vaginal area Denies fever/chills Denied N/V UA neg Leukocytosis  CT: 9.8 x 5.4 x 4.5 cm intramural abscess along the left superior bladder dome. Associated fistulous communication with the sigmoid colon. Possible involvement of the left ovary.  Pt has been seen by [facility] - no indication for surgery Request for drain placement Dr Barbie Banner has reviewed imaging and approves procedure Pt did eat breakfast at 7 am today Npo since   Past Medical History:  Diagnosis Date  . Alcohol abuse    abuse- moderate years ago - states only drinks now on weekends -2 to 8 driinks   . Asthma   . COLONIC POLYPS, HX OF 04/05/2010  . DIVERTICULITIS, HX OF 04/05/2010  . DJD (degenerative joint disease)    right knee, mot to severe  . GERD (gastroesophageal reflux disease)    no meds  . Heart murmur    hx of   . Hyperlipidemia   . Hypertension   . Impaired glucose tolerance 12/06/2013  . Obesity (BMI 30-39.9)   . PALPITATIONS, HX OF 09/14/2007    Past Surgical History:  Procedure Laterality Date  . ABDOMINAL HYSTERECTOMY    . colonscopy     x 2  . KNEE ARTHROSCOPY     left   . TOTAL KNEE ARTHROPLASTY  07/29/2011   Procedure: TOTAL KNEE ARTHROPLASTY;  Surgeon: Mauri Pole, MD;  Location: [facility];  Service: Orthopedics;  Laterality: Right;  . TOTAL KNEE ARTHROPLASTY Left 12/14/2012   Procedure: LEFT TOTAL KNEE ARTHROPLASTY;  Surgeon: Mauri Pole, MD;  Location: [facility];  Service: Orthopedics;  Laterality: Left;  Marland Kitchen VESICOVAGINAL FISTULA  CLOSURE W/ TAH      Allergies: Morphine; Shrimp [shellfish allergy]; Tramadol; and Latex  Medications: Prior to Admission medications   Medication Sig Start Date End Date Taking? Authorizing Provider  albuterol (PROVENTIL HFA;VENTOLIN HFA) 108 (90 Base) MCG/ACT inhaler Inhale 1-2 puffs into the lungs every 6 (six) hours as needed for wheezing. 04/18/16  Yes Larene Pickett, PA-C  atorvastatin (LIPITOR) 10 MG tablet Take 1 tablet (10 mg total) by mouth daily. 12/07/15  Yes Biagio Borg, MD  Fluticasone-Salmeterol (ADVAIR DISKUS) 250-50 MCG/DOSE AEPB Inhale 1 puff into the lungs 2 (two) times daily. 04/07/16  Yes Biagio Borg, MD  HYDROcodone-acetaminophen (NORCO/VICODIN) 5-325 MG tablet Take 1 tablet by mouth 4 (four) times daily as needed. Patient taking differently: Take 1 tablet by mouth 4 (four) times daily as needed for moderate pain or severe pain.  07/25/16  Yes Biagio Borg, MD  ibuprofen (ADVIL,MOTRIN) 600 MG tablet Take 300 mg by mouth 2 (two) times daily as needed for moderate pain.   Yes [provider]  losartan-hydrochlorothiazide (HYZAAR) 100-25 MG tablet Take 1 tablet by mouth every morning. 12/07/15  Yes Biagio Borg, MD  Polyvinyl Alcohol-Povidone (CLEAR EYES ALL SEASONS) 5-6 MG/ML SOLN Place 2 drops into both eyes daily.   Yes [provider]  conjugated estrogens (PREMARIN) vaginal cream Place 1 Applicatorful vaginally daily.  Patient not taking: Reported on 07/28/2016 07/25/16   Biagio Borg, MD  phenazopyridine (PYRIDIUM) 100 MG tablet Take 1 tablet (100 mg total) by mouth 3 (three) times daily as needed for pain. Patient not taking: Reported on 07/28/2016 07/25/16   Biagio Borg, MD     Family History  Problem Relation Age of Onset  . Stroke Mother   . COPD Father   . Lymphoma Sister     Social History   Social History  . Marital status: Married    Spouse name: N/A  . Number of children: 1  . Years of education: N/A   Occupational History  .  [employer]   Social History Main Topics  . Smoking status: Former Smoker    Packs/day: 0.50    Years: 7.00    Types: Cigarettes    Quit date: 02/11/1988  . Smokeless tobacco: Never Used  . Alcohol use No  . Drug use: No     Comment: smoked marajuania x 10 years.  Quit in 1985.  Marland Kitchen Sexual activity: Yes    Birth control/ protection: Surgical   Other Topics Concern  . None   Social History Narrative   Environmental consultant on file.  [name] April 05, 2010 12:04 PM    Review of Systems: A 12 point ROS discussed and pertinent positives are indicated in the HPI above.  All other systems are negative.  Review of Systems  Constitutional: Positive for activity change. Negative for appetite change, fatigue and fever.  Respiratory: Negative for cough and shortness of breath.   Gastrointestinal: Positive for abdominal pain.  Genitourinary: Positive for difficulty urinating, dysuria, frequency, pelvic pain, urgency and vaginal pain. Negative for decreased urine volume, genital sores, hematuria, menstrual problem, vaginal bleeding and vaginal discharge.  Musculoskeletal: Negative for back pain and gait problem.  Neurological: Positive for weakness.  Psychiatric/Behavioral: Negative for behavioral problems and confusion.    Vital Signs: BP (!) 145/92   Pulse (!) 105   Temp 98.1 F (36.7 C) (Oral)   Resp 18   Ht 5\' 1"  (1.549 m)   Wt 295 lb (133.8 kg)   SpO2 95%   BMI 55.74 kg/m   Physical Exam  Constitutional: She is oriented to person, place, and time.  obese  Cardiovascular: Normal rate and regular rhythm.   Pulmonary/Chest: Effort normal.  Abdominal: Soft. Bowel sounds are normal.  Musculoskeletal: Normal range of motion.  Neurological: She is alert and oriented to person, place, and time.  Skin: Skin is warm and dry.  Psychiatric: She has a normal mood and affect. Her behavior is normal. Judgment and thought content normal.  Nursing note and vitals  reviewed.   Mallampati Score:  MD Evaluation Airway: WNL Heart: WNL Abdomen: WNL Chest/ Lungs: WNL ASA  Classification: 2 Mallampati/Airway Score: One  Imaging: Ct Abdomen Pelvis W Contrast  Result Date: 07/28/2016 CLINICAL DATA:  Back pain, dysuria, leukocytosis EXAM: CT ABDOMEN AND PELVIS WITH CONTRAST TECHNIQUE: Multidetector CT imaging of the abdomen and pelvis was performed using the standard protocol following bolus administration of intravenous contrast. CONTRAST:  154mL ISOVUE-300 IOPAMIDOL (ISOVUE-300) INJECTION 61% COMPARISON:  01/09/2011 FINDINGS: Lower chest: Lung bases are clear. Hepatobiliary: 3.5 cm hemangioma along the right hepatic dome (series 3/ image 13), incompletely visualized. Additional smaller lesions are poorly evaluated. Gallbladder is unremarkable. No intrahepatic or extrahepatic ductal dilatation. Pancreas: Within normal limits. Spleen: Within normal limits. Adrenals/Urinary Tract: Adrenal glands are within normal limits. Right kidney is within normal  limits. Two nonobstructing left lower pole renal calculi measuring up to 5 mm (coronal image 68). No ureteral or bladder calculi. No hydronephrosis. Bladder is notable for a 9.8 x 5.4 x 4.5 cm intramural abscess along the left superior bladder dome (series 3/ image 68; coronal image 65). Stomach/Bowel: Stomach is within normal limits. No evidence of bowel obstruction. Normal appendix (series 3/ image 60). Suspected fistula between the sigmoid colon and the intramural bladder abscess (coronal image 64). Vascular/Lymphatic: No evidence of abdominal aortic aneurysm. Atherosclerotic calcifications of the abdominal aorta and branch vessels. Small retroperitoneal lymph nodes which do not meet pathologic CT size criteria. Reproductive: Status post hysterectomy. Right ovary is within normal limits. Intramural bladder abscess is difficult to separate from the left ovary (series 3/image 64). Other: No abdominopelvic ascites. No free  air. Musculoskeletal: Degenerative changes of the visualized thoracolumbar spine. IMPRESSION: 9.8 x 5.4 x 4.5 cm intramural abscess along the left superior bladder dome. Associated fistulous communication with the sigmoid colon. Possible involvement of the left ovary. No ascites or free air. Electronically Signed   By: Julian Hy M.D.   On: 07/28/2016 09:06    Labs:  CBC:  Recent Labs  04/17/16 1538 04/18/16 1201 07/28/16 0600  WBC 10.7* 9.3 16.9*  HGB 14.3 13.9 13.4  HCT 43.6 42.7 41.6  PLT 278.0 212 339    COAGS: No results for input(s): INR, APTT in the last 8760 hours.  BMP:  Recent Labs  04/17/16 1538 04/18/16 1201 07/28/16 0600  NA 138 135 139  K 3.5 3.3* 3.0*  CL 102 98* 101  CO2 26 25 27   GLUCOSE 96 102* 98  BUN 17 13 11   CALCIUM 9.9 9.2 9.0  CREATININE 0.96 0.96 0.82  GFRNONAA  --  >60 >60  GFRAA  --  >60 >60    LIVER FUNCTION TESTS:  Recent Labs  04/17/16 1538 07/10/16 0936 07/28/16 0744  BILITOT 0.3 0.4 0.5  AST 30 32 29  ALT 22 29 21   ALKPHOS 104 91 89  PROT 7.8 7.5 6.9  ALBUMIN 4.0 3.9 3.1*    TUMOR MARKERS: No results for input(s): AFPTM, CEA, CA199, CHROMGRNA in the last 8760 hours.  Assessment and Plan:  Dysuria Bladder wall abscess Scheduled for abscess drain placement in IR Risks and Benefits discussed with the patient including bleeding, infection, damage to adjacent structures, bowel perforation/fistula connection, and sepsis. All of the patient's questions were answered, patient is agreeable to proceed. Consent signed and in chart.   Thank you for this interesting consult.  I greatly enjoyed meeting [name] and look forward to participating in their care.  A copy of this report was sent to the requesting provider on this date.  Electronically Signed: Lavonia Drafts, PA-C 07/28/2016, 1:01 PM   I spent a total of 40 Minutes    in face to face in clinical consultation, greater than 50% of which was  counseling/coordinating care for bladder wall abscess drain placement

## 2016-07-28 NOTE — H&P (Signed)
History and Physical    [name] NTI:[id number] DOB: 06-18-1960 DOA: 07/28/2016   PCP: Biagio Borg, MD   Patient coming from/Resides with: Private residence  Chief Complaint: Continued dysuria with suprapubic and back pain  HPI: [name] is a 56 y.o. female with medical history significant for obesity, asthma, hypertension, dyslipidemia, history of diverticulitis with known pancolonic diverticula based on recent colonoscopy on 07/25/16, and GERD who presented with continued dysuria, urinary frequency with urgency as well as subjective fevers and suprapubic and low back pain despite treatment with antibiotics and Pyridium. Patient was initially seen in the ER on 6/4 and was prescribed Keflex. She returned to the ER on 6/7 due to non-improvement in symptoms and was given Pyridium. Initial urine culture and repeat culture on 6/15 with multiple species present. Today patient had leukocytosis with white count of 16,900. CT abdomen and pelvis today revealed a 9.8 x 5.4 x 4.5 internal abscess on the left superior bladder dome with the appearance of an associated fistulous communication with the sigmoid colon as well as possible involvement of the left ovary. Patient has been evaluated by general surgery and urology while in the ER and at this time no definitive surgical treatment has been deemed necessary. We have been asked to admit for medical management of bladder wall abscess of indeterminate etiology. In retrospect, patient reports that beginning in mid May she noticed painful urination and by the time she presented in June she began having back pain and suprapubic pain.  ED Course:  Vital Signs: BP 133/81   Pulse 98   Temp 98.1 F (36.7 C) (Oral)   Resp 18   Ht 5\' 1"  (1.549 m)   Wt 133.8 kg (295 lb)   SpO2 98%   BMI 55.74 kg/m  2 view CXR: Pulmonary vascular prominence and venous hypertensive changes without overt pulmonary edema or pleural effusions CT abdomen and pelvis with  contrast: As above Lab data: Sodium 139, potassium 3.0, chloride 101, CO2 27, glucose 98, BUN 11, creatinine 0.82, anion gap 11, LFTs normal, albumin 3.1, white count 16,900 differential not obtained, urinalysis abnormal with hazy appearance, rare bacteria, amber color, 100 protein Medications and treatments: Normal saline bolus 1 L, Toradol 15 mg IV 1, Zosyn 3.375 g IV 1, Vicodin 5-3 252 tablets  Review of Systems:  In addition to the HPI above,  No Headache, changes with Vision or hearing, new weakness, tingling, numbness in any extremity, dizziness, dysarthria or word finding difficulty, gait disturbance or imbalance, tremors or seizure activity No problems swallowing food or Liquids, indigestion/reflux, choking or coughing while eating, abdominal pain with or after eating No Chest pain, Cough or Shortness of Breath, palpitations, orthopnea or DOE No N/V, melena, hematochezia, dark tarry stools No hematuria  No new skin rashes, lesions, masses or bruises, No new joint pains, aches, swelling or redness No recent unintentional weight gain or loss No polyuria, polydypsia or polyphagia   Past Medical History:  Diagnosis Date  . Alcohol abuse    abuse- moderate years ago - states only drinks now on weekends -2 to 8 driinks   . Asthma   . COLONIC POLYPS, HX OF 04/05/2010  . DIVERTICULITIS, HX OF 04/05/2010  . DJD (degenerative joint disease)    right knee, mot to severe  . GERD (gastroesophageal reflux disease)    no meds  . Heart murmur    hx of   . Hyperlipidemia   . Hypertension   . Impaired glucose tolerance  12/06/2013  . Obesity (BMI 30-39.9)   . PALPITATIONS, HX OF 09/14/2007    Past Surgical History:  Procedure Laterality Date  . ABDOMINAL HYSTERECTOMY    . COLONOSCOPY WITH PROPOFOL N/A 07/25/2016   Procedure: COLONOSCOPY WITH PROPOFOL;  Surgeon: Carol Ada, MD;  Location: [facility];  Service: Endoscopy;  Laterality: N/A;  . colonscopy     x 2  . KNEE ARTHROSCOPY      left   . TOTAL KNEE ARTHROPLASTY  07/29/2011   Procedure: TOTAL KNEE ARTHROPLASTY;  Surgeon: Mauri Pole, MD;  Location: [facility];  Service: Orthopedics;  Laterality: Right;  . TOTAL KNEE ARTHROPLASTY Left 12/14/2012   Procedure: LEFT TOTAL KNEE ARTHROPLASTY;  Surgeon: Mauri Pole, MD;  Location: [facility];  Service: Orthopedics;  Laterality: Left;  Marland Kitchen VESICOVAGINAL FISTULA CLOSURE W/ TAH      Social History   Social History  . Marital status: Married    Spouse name: N/A  . Number of children: 1  . Years of education: N/A   Occupational History  . PACKER [employer]   Social History Main Topics  . Smoking status: Former Smoker    Packs/day: 0.50    Years: 7.00    Types: Cigarettes    Quit date: 02/11/1988  . Smokeless tobacco: Never Used  . Alcohol use No  . Drug use: No     Comment: smoked marajuania x 10 years.  Quit in 1985.  Marland Kitchen Sexual activity: Yes    Birth control/ protection: Surgical   Other Topics Concern  . Not on file   Social History Narrative   Environmental consultant on file.  [facility] [place] April 05, 2010 12:04 PM    Mobility: Independent Work history: Not obtained   Allergies  Allergen Reactions  . Morphine Itching  . Shrimp [Shellfish Allergy] Itching    Tongue burns  . Tramadol Other (See Comments)    confusion  . Latex Rash    Family History  Problem Relation Age of Onset  . Stroke Mother   . COPD Father   . Lymphoma Sister    Family history reviewed -Patient denies history of ovarian cancer in her family  Prior to Admission medications   Medication Sig Start Date End Date Taking? Authorizing Provider  albuterol (PROVENTIL HFA;VENTOLIN HFA) 108 (90 Base) MCG/ACT inhaler Inhale 1-2 puffs into the lungs every 6 (six) hours as needed for wheezing. 04/18/16  Yes Larene Pickett, PA-C  atorvastatin (LIPITOR) 10 MG tablet Take 1 tablet (10 mg total) by mouth daily. 12/07/15  Yes Biagio Borg, MD  Fluticasone-Salmeterol (ADVAIR DISKUS) 250-50  MCG/DOSE AEPB Inhale 1 puff into the lungs 2 (two) times daily. 04/07/16  Yes Biagio Borg, MD  HYDROcodone-acetaminophen (NORCO/VICODIN) 5-325 MG tablet Take 1 tablet by mouth 4 (four) times daily as needed. Patient taking differently: Take 1 tablet by mouth 4 (four) times daily as needed for moderate pain or severe pain.  07/25/16  Yes Biagio Borg, MD  ibuprofen (ADVIL,MOTRIN) 600 MG tablet Take 300 mg by mouth 2 (two) times daily as needed for moderate pain.   Yes [provider]  losartan-hydrochlorothiazide (HYZAAR) 100-25 MG tablet Take 1 tablet by mouth every morning. 12/07/15  Yes Biagio Borg, MD  Polyvinyl Alcohol-Povidone (CLEAR EYES ALL SEASONS) 5-6 MG/ML SOLN Place 2 drops into both eyes daily.   Yes [provider]  conjugated estrogens (PREMARIN) vaginal cream Place 1 Applicatorful vaginally daily. Patient not taking: Reported on 07/28/2016 07/25/16  Biagio Borg, MD  phenazopyridine (PYRIDIUM) 100 MG tablet Take 1 tablet (100 mg total) by mouth 3 (three) times daily as needed for pain. Patient not taking: Reported on 07/28/2016 07/25/16   Biagio Borg, MD    Physical Exam: Vitals:   07/28/16 0700 07/28/16 0900 07/28/16 1110 07/28/16 1200  BP: (!) 144/84 129/72 (!) 145/92 133/81  Pulse: (!) 109 (!) 101 (!) 105 98  Resp:   18   Temp:      TempSrc:      SpO2: 97% 96% 95% 98%  Weight:      Height:          Constitutional: NAD, calm, comfortable Eyes: PERRL, lids and conjunctivae normal ENMT: Mucous membranes are moist. Posterior pharynx clear of any exudate or lesions.Normal dentition.  Neck: normal, supple, no masses, no thyromegaly Respiratory: clear to auscultation bilaterally, no wheezing, no crackles. Normal respiratory effort. No accessory muscle use.  Cardiovascular: Regular rate and rhythm, no murmurs / rubs / gallops. No extremity edema. 2+ pedal pulses. No carotid bruits.  Abdomen: Focal suprapubic tenderness, no masses palpated. No  hepatosplenomegaly. Bowel sounds positive.  Musculoskeletal: no clubbing / cyanosis. No joint deformity upper and lower extremities. Good ROM, no contractures. Normal muscle tone. Central low back pain reproduced with palpation Skin: no rashes, lesions, ulcers. No induration Neurologic: CN 2-12 grossly intact. Sensation intact, DTR normal. Strength 5/5 x all 4 extremities.  Psychiatric: Normal judgment and insight. Alert and oriented x 3. Normal mood.    Labs on Admission: I have personally reviewed following labs and imaging studies  CBC:  Recent Labs Lab 07/28/16 0600  WBC 16.9*  HGB 13.4  HCT 41.6  MCV 90.4  PLT 762   Basic Metabolic Panel:  Recent Labs Lab 07/28/16 0600  NA 139  K 3.0*  CL 101  CO2 27  GLUCOSE 98  BUN 11  CREATININE 0.82  CALCIUM 9.0   GFR: Estimated Creatinine Clearance: 100.6 mL/min (by C-G formula based on SCr of 0.82 mg/dL). Liver Function Tests:  Recent Labs Lab 07/28/16 0744  AST 29  ALT 21  ALKPHOS 89  BILITOT 0.5  PROT 6.9  ALBUMIN 3.1*    Recent Labs Lab 07/28/16 0744  LIPASE 20   No results for input(s): AMMONIA in the last 168 hours. Coagulation Profile: No results for input(s): INR, PROTIME in the last 168 hours. Cardiac Enzymes: No results for input(s): CKTOTAL, CKMB, CKMBINDEX, TROPONINI in the last 168 hours. BNP (last 3 results) No results for input(s): PROBNP in the last 8760 hours. HbA1C: No results for input(s): HGBA1C in the last 72 hours. CBG: No results for input(s): GLUCAP in the last 168 hours. Lipid Profile: No results for input(s): CHOL, HDL, LDLCALC, TRIG, CHOLHDL, LDLDIRECT in the last 72 hours. Thyroid Function Tests: No results for input(s): TSH, T4TOTAL, FREET4, T3FREE, THYROIDAB in the last 72 hours. Anemia Panel: No results for input(s): VITAMINB12, FOLATE, FERRITIN, TIBC, IRON, RETICCTPCT in the last 72 hours. Urine analysis:    Component Value Date/Time   COLORURINE AMBER (A) 07/28/2016  0557   APPEARANCEUR HAZY (A) 07/28/2016 0557   LABSPEC 1.020 07/28/2016 0557   PHURINE 5.0 07/28/2016 0557   GLUCOSEU NEGATIVE 07/28/2016 [place] 07/25/2016 0905   HGBUR NEGATIVE 07/28/2016 0557   BILIRUBINUR NEGATIVE 07/28/2016 0557   BILIRUBINUR negative 09/16/2011 1047   KETONESUR NEGATIVE 07/28/2016 0557   PROTEINUR 100 (A) 07/28/2016 0557   UROBILINOGEN 0.2 07/25/2016 0905   NITRITE  NEGATIVE 07/28/2016 0557   LEUKOCYTESUR NEGATIVE 07/28/2016 0557   Sepsis Labs: @LABRCNTIP (procalcitonin:4,lacticidven:4) ) Recent Results (from the past 240 hour(s))  Urine Culture     Status: None   Collection Time: 07/25/16  9:05 AM  Result Value Ref Range Status   Organism ID, Bacteria   Final    Multiple organisms present,each less than 10,000 CFU/mL. These organisms,commonly found on external and internal genitalia,are considered colonizers. No further testing performed.      Radiological Exams on Admission: Ct Abdomen Pelvis W Contrast  Result Date: 07/28/2016 CLINICAL DATA:  Back pain, dysuria, leukocytosis EXAM: CT ABDOMEN AND PELVIS WITH CONTRAST TECHNIQUE: Multidetector CT imaging of the abdomen and pelvis was performed using the standard protocol following bolus administration of intravenous contrast. CONTRAST:  173mL ISOVUE-300 IOPAMIDOL (ISOVUE-300) INJECTION 61% COMPARISON:  01/09/2011 FINDINGS: Lower chest: Lung bases are clear. Hepatobiliary: 3.5 cm hemangioma along the right hepatic dome (series 3/ image 13), incompletely visualized. Additional smaller lesions are poorly evaluated. Gallbladder is unremarkable. No intrahepatic or extrahepatic ductal dilatation. Pancreas: Within normal limits. Spleen: Within normal limits. Adrenals/Urinary Tract: Adrenal glands are within normal limits. Right kidney is within normal limits. Two nonobstructing left lower pole renal calculi measuring up to 5 mm (coronal image 68). No ureteral or bladder calculi. No hydronephrosis.  Bladder is notable for a 9.8 x 5.4 x 4.5 cm intramural abscess along the left superior bladder dome (series 3/ image 68; coronal image 65). Stomach/Bowel: Stomach is within normal limits. No evidence of bowel obstruction. Normal appendix (series 3/ image 60). Suspected fistula between the sigmoid colon and the intramural bladder abscess (coronal image 64). Vascular/Lymphatic: No evidence of abdominal aortic aneurysm. Atherosclerotic calcifications of the abdominal aorta and branch vessels. Small retroperitoneal lymph nodes which do not meet pathologic CT size criteria. Reproductive: Status post hysterectomy. Right ovary is within normal limits. Intramural bladder abscess is difficult to separate from the left ovary (series 3/image 64). Other: No abdominopelvic ascites. No free air. Musculoskeletal: Degenerative changes of the visualized thoracolumbar spine. IMPRESSION: 9.8 x 5.4 x 4.5 cm intramural abscess along the left superior bladder dome. Associated fistulous communication with the sigmoid colon. Possible involvement of the left ovary. No ascites or free air. Electronically Signed   By: Julian Hy M.D.   On: 07/28/2016 09:06     Assessment/Plan Principal Problem:   Abscess of bladder/Colonic diverticulum -Patient presents with ongoing UTI type symptoms with associated suprapubic and low back pain not improved with antibiotic therapy --CT imaging revealed large intramural abscess along the left superior bladder dome with possible involvement of the left ovary with concerns over possible colonic fistula -Known history of diverticulitis with recent colonoscopy negative for acute diverticulitis but demonstrating pancolonic diverticula -Gen. surgery and urology consulted with no acute surgical needs identified -IR consulted; plans are to pursue percutaneous drainage and culture of abscess with contrast injection to determine if fistulous connection with colon -Allow clear liquids then NPO after  midnight-clear liquids will be absorbed in the small intestine -Normal saline IV fluids -Combination oral and IV narcotics for pain management -Zosyn IV 3.375 g every 8 hours  Active Problems:   Hypertension -Continue preadmission losartan but hold thiazide diuretic portion    Alcohol abuse -Patient reports social drinking only without daily ingestion -Last drank this past Saturday at a friend's birthday party    Acute hypokalemia -Likely secondary to thiazide diuretic as well as poor oral intake prior to admission -Oral repletion -Follow labs    Asthma -Continue preadmission  MDIs -Currently stable without active wheezing or reports of shortness of breath    Obesity (BMI 30-39.9) -Defer weight reduction strategies to PCP    HLD (hyperlipidemia) -Hold preadmission statin until diet advanced      DVT prophylaxis: Lovenox 1 dose now then resume dosing at 6 PM tomorrow post IR procedure  Code Status: Full  Family Communication: Family at bedside Disposition Plan: Home Consults called: Gen. Surgery/Tsuei; Urology/Wrenn; IR/Hoss    Samella Parr ANP-BC [facility] Pager [phone]   If 7PM-7AM, please contact night-coverage [email or web address] Password [email or web address]  07/28/2016, 2:05 PM

## 2016-07-28 NOTE — Telephone Encounter (Signed)
Pt called stating that she was here to see Dr Jenny Reichmann on Friday due to trouble urinating. She has been admitted to [facility] because they found an abscess on the wall of her bladder. They will be performing a procedure tomorrow. She asked if Dr Jenny Reichmann could call her so that she could discuss this issue with him. I told her that Dr Jenny Reichmann is not in the office on Monday but that I would send the message back. Please advise.

## 2016-07-28 NOTE — ED Notes (Signed)
Pt ambulatory to the restroom.  

## 2016-07-28 NOTE — ED Provider Notes (Signed)
[facility] DEPT Provider Note   CSN: [id number] Arrival date & time: 07/28/16  0532     History   Chief Complaint Chief Complaint  Patient presents with  . Back Pain  . Dysuria    HPI [name] is a 56 y.o. female.  HPI  Patient presents with concern of dysuria, back pain, pubic pain. Symptoms began possibly several weeks ago, but have become more uncomfortable over the past 2 weeks or so. No clear precipitant. Patient complains of pain in the midline lower back, and suprapubic pressure. There is associated dysuria, but no hematuria. No fever, no chills. Patient does have generalized weakness. No nausea, no anorexia, vomiting, no diarrhea. During this illness the patient has seen her primary care physician, has had a colonoscopy, has been to urgent care, emergency department, and has had multiple lab draws, urinalysis studies performed. Patient has completed a course of antibiotics, as well as a course of Pyridium, all with no substantial change in her condition.   Past Medical History:  Diagnosis Date  . Alcohol abuse    abuse- moderate years ago - states only drinks now on weekends -2 to 8 driinks   . Asthma   . COLONIC POLYPS, HX OF 04/05/2010  . DIVERTICULITIS, HX OF 04/05/2010  . DJD (degenerative joint disease)    right knee, mot to severe  . GERD (gastroesophageal reflux disease)    no meds  . Heart murmur    hx of   . Hyperlipidemia   . Hypertension   . Impaired glucose tolerance 12/06/2013  . Obesity (BMI 30-39.9)   . PALPITATIONS, HX OF 09/14/2007    Patient Active Problem List   Diagnosis Date Noted  . Dysuria 07/25/2016  . Hyperlipidemia 07/10/2016  . Mass of left side of neck 07/10/2016  . Head and neck lymphadenopathy 07/10/2016  . Acute sinus infection 01/25/2016  . Eustachian tube disorder 01/25/2016  . Asthma exacerbation 05/18/2015  . Peripheral edema 03/16/2015  . Asthma 03/16/2015  . Right shoulder pain 03/16/2015  .  Hypokalemia 10/04/2014  . Upper airway cough syndrome 10/03/2014  . Severe obesity (BMI >= 40) ([place]) 10/03/2014  . Lower back pain 05/25/2014  . Bilateral shoulder pain 05/25/2014  . Chest pain 04/14/2014  . Allergic rhinitis 12/06/2013  . Impaired glucose tolerance 12/06/2013  . Expected blood loss anemia 12/16/2012  . Morbid obesity ([place]) 12/15/2012  . S/P left TKA 12/14/2012  . Goiter 11/26/2012  . Preop exam for internal medicine 11/26/2012  . Diarrhea 02/25/2012  . Colon polyps 02/10/2011  . Eczema 02/10/2011  . Depression 02/10/2011  . Preventative health care 09/27/2010  . Anxiety state 04/05/2010  . DIVERTICULITIS, HX OF 04/05/2010  . PALPITATIONS, HX OF 09/14/2007  . Obesity 04/24/2007  . Essential hypertension 04/24/2007  . VOCAL CORD DISORDER 04/24/2007  . Extrinsic asthma 04/24/2007  . GERD 04/24/2007    Past Surgical History:  Procedure Laterality Date  . ABDOMINAL HYSTERECTOMY    . colonscopy     x 2  . KNEE ARTHROSCOPY     left   . TOTAL KNEE ARTHROPLASTY  07/29/2011   Procedure: TOTAL KNEE ARTHROPLASTY;  Surgeon: Mauri Pole, MD;  Location: [facility];  Service: Orthopedics;  Laterality: Right;  . TOTAL KNEE ARTHROPLASTY Left 12/14/2012   Procedure: LEFT TOTAL KNEE ARTHROPLASTY;  Surgeon: Mauri Pole, MD;  Location: [facility];  Service: Orthopedics;  Laterality: Left;  Marland Kitchen VESICOVAGINAL FISTULA CLOSURE W/ TAH      OB History  No data available       Home Medications    Prior to Admission medications   Medication Sig Start Date End Date Taking? Authorizing Provider  albuterol (PROVENTIL HFA;VENTOLIN HFA) 108 (90 Base) MCG/ACT inhaler Inhale 1-2 puffs into the lungs every 6 (six) hours as needed for wheezing. 04/18/16   Larene Pickett, PA-C  atorvastatin (LIPITOR) 10 MG tablet Take 1 tablet (10 mg total) by mouth daily. 12/07/15   Biagio Borg, MD  conjugated estrogens (PREMARIN) vaginal cream Place 1 Applicatorful vaginally daily. 07/25/16   Biagio Borg, MD  Fluticasone-Salmeterol (ADVAIR DISKUS) 250-50 MCG/DOSE AEPB Inhale 1 puff into the lungs 2 (two) times daily. 04/07/16   Biagio Borg, MD  HYDROcodone-acetaminophen (NORCO/VICODIN) 5-325 MG tablet Take 1 tablet by mouth 4 (four) times daily as needed. Patient taking differently: Take 1 tablet by mouth 4 (four) times daily as needed for moderate pain or severe pain.  07/25/16   Biagio Borg, MD  ibuprofen (ADVIL,MOTRIN) 600 MG tablet Take 300 mg by mouth 2 (two) times daily as needed for moderate pain.    [provider]  losartan-hydrochlorothiazide (HYZAAR) 100-25 MG tablet Take 1 tablet by mouth every morning. 12/07/15   Biagio Borg, MD  phenazopyridine (PYRIDIUM) 100 MG tablet Take 1 tablet (100 mg total) by mouth 3 (three) times daily as needed for pain. 07/25/16   Biagio Borg, MD  Polyvinyl Alcohol-Povidone (CLEAR EYES ALL SEASONS) 5-6 MG/ML SOLN Place 2 drops into both eyes daily.    [provider]  terbinafine (LAMISIL) 250 MG tablet Take 250 mg by mouth daily. continuous 07/02/16   [provider]    Family History Family History  Problem Relation Age of Onset  . Stroke Mother   . COPD Father   . Lymphoma Sister     Social History Social History  Substance Use Topics  . Smoking status: Former Smoker    Packs/day: 0.50    Years: 7.00    Types: Cigarettes    Quit date: 02/11/1988  . Smokeless tobacco: Never Used  . Alcohol use No     Allergies   Morphine; Shrimp [shellfish allergy]; Tramadol; and Latex   Review of Systems Review of Systems  Constitutional:       Per HPI, otherwise negative  HENT:       Per HPI, otherwise negative  Respiratory:       Per HPI, otherwise negative  Cardiovascular:       Per HPI, otherwise negative  Gastrointestinal: Negative for vomiting.  Endocrine:       Negative aside from HPI  Genitourinary:       Neg aside from HPI   Musculoskeletal:       Per HPI, otherwise negative  Skin: Negative.    Allergic/Immunologic: Negative for immunocompromised state.  Neurological: Positive for weakness. Negative for syncope.     Physical Exam Updated Vital Signs BP 129/72   Pulse (!) 101   Temp 98.1 F (36.7 C) (Oral)   Resp 17   Ht 5\' 1"  (1.549 m)   Wt 133.8 kg (295 lb)   SpO2 96%   BMI 55.74 kg/m   Physical Exam  Constitutional: She is oriented to person, place, and time. She appears well-developed and well-nourished. No distress.  HENT:  Head: Normocephalic and atraumatic.  Eyes: Conjunctivae and EOM are normal.  Cardiovascular: Normal rate and regular rhythm.   Pulmonary/Chest: Effort normal and breath sounds normal. No stridor. No  respiratory distress.  Abdominal: She exhibits no distension.  Mild suprapubic tenderness.  Musculoskeletal: She exhibits no edema.  Neurological: She is alert and oriented to person, place, and time. No cranial nerve deficit.  Skin: Skin is warm and dry.  Psychiatric: She has a normal mood and affect.  Nursing note and vitals reviewed.    ED Treatments / Results  Labs (all labs ordered are listed, but only abnormal results are displayed) Labs Reviewed  URINALYSIS, ROUTINE W REFLEX MICROSCOPIC - Abnormal; Notable for the following:       Result Value   Color, Urine AMBER (*)    APPearance HAZY (*)    Protein, ur 100 (*)    Bacteria, UA RARE (*)    Squamous Epithelial / LPF 0-5 (*)    All other components within normal limits  CBC - Abnormal; Notable for the following:    WBC 16.9 (*)    All other components within normal limits  BASIC METABOLIC PANEL - Abnormal; Notable for the following:    Potassium 3.0 (*)    All other components within normal limits  HEPATIC FUNCTION PANEL - Abnormal; Notable for the following:    Albumin 3.1 (*)    All other components within normal limits  URINE CULTURE  LIPASE, BLOOD     Radiology Ct Abdomen Pelvis W Contrast  Result Date: 07/28/2016 CLINICAL DATA:  Back pain, dysuria, leukocytosis  EXAM: CT ABDOMEN AND PELVIS WITH CONTRAST TECHNIQUE: Multidetector CT imaging of the abdomen and pelvis was performed using the standard protocol following bolus administration of intravenous contrast. CONTRAST:  144mL ISOVUE-300 IOPAMIDOL (ISOVUE-300) INJECTION 61% COMPARISON:  01/09/2011 FINDINGS: Lower chest: Lung bases are clear. Hepatobiliary: 3.5 cm hemangioma along the right hepatic dome (series 3/ image 13), incompletely visualized. Additional smaller lesions are poorly evaluated. Gallbladder is unremarkable. No intrahepatic or extrahepatic ductal dilatation. Pancreas: Within normal limits. Spleen: Within normal limits. Adrenals/Urinary Tract: Adrenal glands are within normal limits. Right kidney is within normal limits. Two nonobstructing left lower pole renal calculi measuring up to 5 mm (coronal image 68). No ureteral or bladder calculi. No hydronephrosis. Bladder is notable for a 9.8 x 5.4 x 4.5 cm intramural abscess along the left superior bladder dome (series 3/ image 68; coronal image 65). Stomach/Bowel: Stomach is within normal limits. No evidence of bowel obstruction. Normal appendix (series 3/ image 60). Suspected fistula between the sigmoid colon and the intramural bladder abscess (coronal image 64). Vascular/Lymphatic: No evidence of abdominal aortic aneurysm. Atherosclerotic calcifications of the abdominal aorta and branch vessels. Small retroperitoneal lymph nodes which do not meet pathologic CT size criteria. Reproductive: Status post hysterectomy. Right ovary is within normal limits. Intramural bladder abscess is difficult to separate from the left ovary (series 3/image 64). Other: No abdominopelvic ascites. No free air. Musculoskeletal: Degenerative changes of the visualized thoracolumbar spine. IMPRESSION: 9.8 x 5.4 x 4.5 cm intramural abscess along the left superior bladder dome. Associated fistulous communication with the sigmoid colon. Possible involvement of the left ovary. No ascites  or free air. Electronically Signed   By: Julian Hy M.D.   On: 07/28/2016 09:06    Procedures Procedures (including critical care time)  Medications Ordered in ED Medications  piperacillin-tazobactam (ZOSYN) IVPB 3.375 g (not administered)  sodium chloride 0.9 % bolus 1,000 mL (0 mLs Intravenous Stopped 07/28/16 1009)  ketorolac (TORADOL) 30 MG/ML injection 15 mg (15 mg Intravenous Given 07/28/16 0754)  iopamidol (ISOVUE-300) 61 % injection 100 mL (100 mLs Intravenous Contrast Given 07/28/16  0808)     Initial Impression / Assessment and Plan / ED Course  I have reviewed the triage vital signs and the nursing notes.  Pertinent labs & imaging results that were available during my care of the patient were reviewed by me and considered in my medical decision making (see chart for details).     Chart review notable for multiple visits over the past 3 weeks including urgent care, emergency department, primary care. Patient has had completion of urinalysis, urine culture, with no growth, has had colonoscopy with no noted findings. She has not had any advanced imaging during this illness.  Colonoscopy path report not yet available.  Post-procedure notes below: - Two 5 to 10 mm polyps in the sigmoid colon and in the transverse colon, removed with a hot snare. Resected and retrieved. Clip (MR unsafe) was placed. - Three 3 to 4 mm polyps in the descending colon and in the transverse colon, removed with a cold snare. Resected and retrieved. - Large lipoma in the recto-sigmoid colon. - Diverticulosis in the sigmoid colon, in the descending colon and in the transverse colon.   Update: I discussed all findings with the patient and her companion. Specifically we discussed the finding of intramural bladder abscess, with likely fistula, surrounding inflammation. Patient will be started on Zosyn.  I discussed patient's case with our general surgery colleagues for admission, further evaluation  and management.   Final Clinical Impressions(s) / ED Diagnoses  Bladder wall abscess Fistula   Carmin Muskrat, MD 07/28/16 1043

## 2016-07-28 NOTE — Progress Notes (Signed)
Patient was scheduled today for IR percutaneous drainage, however, she was given clear liquids after an NPO order was written.  Unfortunately patient can no longer be sedated for her procedure.  We will have to plan to wait until tomorrow when she can be NPO p MN.  Sheala Dosh E

## 2016-07-28 NOTE — Consult Note (Signed)
[name] Consult/Admission Note  [name] 1960-04-21  [id number].    Requesting MD: Dr. Vanita Panda Chief Complaint/Reason for Consult: Bladder abscess  HPI:  Pt is a life-year-old female with a history of obesity, hypertension, GERD, asthma, colon polyps last colonoscopy on 07/25/16, diverticulitis with last flareup roughly 4 years ago per patient, who presented to the [facility] ED with complaints of painful urination. Patient states she has been having pain with urination for roughly 1 month but it has progressively worsened. She has seen her PCP for this. Patient states the pain is in her vagina, intermittent, severe and worse with urinating, nonradiating, pressure sensation with sharp pains. Associated urinary frequency and urgency, subjective fever and low back pain. She denies blood in her urine, burning while urinating, chills, changes in bowel habits, nausea, vomiting, abdominal pain.  Colonoscopy 07/25/16: Polyps were found in the descending and transverse colon. Large lipoma in the rectosigmoid colon. Scattered diverticula in the sigmoid, descending and transverse colon.  ED course Labs: WBC 16.9, potassium 3.0, urinalysis showed rare bacteria no blood or nitrites CT abdomen pelvis: Intramural abscess along the left superior bladder dome with associated fistula communication with the sigmoid colon. Possible involvement of the left ovary.  ROS:  Review of Systems  Constitutional: Positive for fever (subjective). Negative for chills.  HENT: Negative for sore throat.   Respiratory: Negative for shortness of breath.   Cardiovascular: Negative for chest pain.  Gastrointestinal: Negative for abdominal pain, constipation, diarrhea, nausea and vomiting.  Genitourinary: Positive for dysuria, frequency and urgency. Negative for flank pain and hematuria.  Musculoskeletal: Positive for back pain. Negative for falls.  Skin: Negative for rash.  Neurological: Negative for  dizziness and loss of consciousness.  All other systems reviewed and are negative.    Family History  Problem Relation Age of Onset  . Stroke Mother   . COPD Father   . Lymphoma Sister     Past Medical History:  Diagnosis Date  . Alcohol abuse    abuse- moderate years ago - states only drinks now on weekends -2 to 8 driinks   . Asthma   . COLONIC POLYPS, HX OF 04/05/2010  . DIVERTICULITIS, HX OF 04/05/2010  . DJD (degenerative joint disease)    right knee, mot to severe  . GERD (gastroesophageal reflux disease)    no meds  . Heart murmur    hx of   . Hyperlipidemia   . Hypertension   . Impaired glucose tolerance 12/06/2013  . Obesity (BMI 30-39.9)   . PALPITATIONS, HX OF 09/14/2007    Past Surgical History:  Procedure Laterality Date  . ABDOMINAL HYSTERECTOMY    . colonscopy     x 2  . KNEE ARTHROSCOPY     left   . TOTAL KNEE ARTHROPLASTY  07/29/2011   Procedure: TOTAL KNEE ARTHROPLASTY;  Surgeon: Mauri Pole, MD;  Location: [facility];  Service: Orthopedics;  Laterality: Right;  . TOTAL KNEE ARTHROPLASTY Left 12/14/2012   Procedure: LEFT TOTAL KNEE ARTHROPLASTY;  Surgeon: Mauri Pole, MD;  Location: [facility];  Service: Orthopedics;  Laterality: Left;  Marland Kitchen VESICOVAGINAL FISTULA CLOSURE W/ TAH      Social History:  reports that she quit smoking about 28 years ago. Her smoking use included Cigarettes. She has a 3.50 pack-year smoking history. She has never used smokeless tobacco. She reports that she does not drink alcohol or use drugs.  Allergies:  Allergies  Allergen Reactions  . Morphine Itching  . Shrimp [  Shellfish Allergy] Itching    Tongue burns  . Tramadol Other (See Comments)    confusion  . Latex Rash     (Not in a hospital admission)  Blood pressure (!) 145/92, pulse (!) 105, temperature 98.1 F (36.7 C), temperature source Oral, resp. rate 18, height _0  (1.549 m), weight 295 lb (133.8 kg), SpO2 95 %.  Physical Exam  Constitutional: She is oriented  to person, place, and time and well-developed, well-nourished, and in no distress. Vital signs are normal. No distress.  Morbidly obese, pleasant  HENT:  Head: Normocephalic and atraumatic.  Nose: Nose normal.  Eyes: Conjunctivae are normal. Right eye exhibits no discharge. Left eye exhibits no discharge. No scleral icterus.  pupils are equal  Neck: Normal range of motion. Neck supple. No tracheal deviation present. No thyromegaly present.  Cardiovascular: Normal rate, regular rhythm, normal heart sounds and intact distal pulses.  Exam reveals no gallop and no friction rub.   No murmur heard. Pulses:      Radial pulses are 2+ on the right side, and 2+ on the left side.  Pulmonary/Chest: Effort normal and breath sounds normal. No respiratory distress. She has no decreased breath sounds. She has no wheezes. She has no rhonchi. She has no rales.  Abdominal: Soft. Bowel sounds are normal. She exhibits no distension and no mass. There is tenderness (Mild suprapubic tenderness). There is no rebound, no guarding and no CVA tenderness. A hernia (small umbilical) is present.  Musculoskeletal: Normal range of motion. She exhibits no edema, tenderness or deformity.  Large soft, mobile, nontender mass noted to right anterior thigh (patient states is a lipoma) No deformities or step-offs noted to the lumbar spine, mild TTP to mid lower lumbar spine and paraspinal muscles  Neurological: She is alert and oriented to person, place, and time.  Skin: Skin is warm and dry. No rash noted. She is not diaphoretic.  Psychiatric: Mood and affect normal.  Nursing note and vitals reviewed.   Results for orders placed or performed during the hospital encounter of 07/28/16 (from the past 48 hour(s))  Urinalysis, Routine w reflex microscopic     Status: Abnormal   Collection Time: 07/28/16  5:57 AM  Result Value Ref Range   Color, Urine AMBER (A) YELLOW    Comment: BIOCHEMICALS MAY BE AFFECTED BY COLOR   APPearance  HAZY (A) CLEAR   Specific Gravity, Urine 1.020 1.005 - 1.030   pH 5.0 5.0 - 8.0   Glucose, UA NEGATIVE NEGATIVE mg/dL   Hgb urine dipstick NEGATIVE NEGATIVE   Bilirubin Urine NEGATIVE NEGATIVE   Ketones, ur NEGATIVE NEGATIVE mg/dL   Protein, ur 100 (A) NEGATIVE mg/dL   Nitrite NEGATIVE NEGATIVE   Leukocytes, UA NEGATIVE NEGATIVE   RBC / HPF 0-5 0 - 5 RBC/hpf   WBC, UA 0-5 0 - 5 WBC/hpf   Bacteria, UA RARE (A) NONE SEEN   Squamous Epithelial / LPF 0-5 (A) NONE SEEN   Mucous PRESENT   CBC     Status: Abnormal   Collection Time: 07/28/16  6:00 AM  Result Value Ref Range   WBC 16.9 (H) 4.0 - 10.5 K/uL   RBC 4.60 3.87 - 5.11 MIL/uL   Hemoglobin 13.4 12.0 - 15.0 g/dL   HCT 41.6 36.0 - 46.0 %   MCV 90.4 78.0 - 100.0 fL   MCH 29.1 26.0 - 34.0 pg   MCHC 32.2 30.0 - 36.0 g/dL   RDW 13.6 11.5 - 15.5 %  Platelets 339 150 - 400 K/uL  Basic metabolic panel     Status: Abnormal   Collection Time: 07/28/16  6:00 AM  Result Value Ref Range   Sodium 139 135 - 145 mmol/L   Potassium 3.0 (L) 3.5 - 5.1 mmol/L   Chloride 101 101 - 111 mmol/L   CO2 27 22 - 32 mmol/L   Glucose, Bld 98 65 - 99 mg/dL   BUN 11 6 - 20 mg/dL   Creatinine, Ser 0.82 0.44 - 1.00 mg/dL   Calcium 9.0 8.9 - 10.3 mg/dL   GFR calc non Af Amer >60 >60 mL/min   GFR calc Af Amer >60 >60 mL/min    Comment: (NOTE) The eGFR has been calculated using the CKD EPI equation. This calculation has not been validated in all clinical situations. eGFR's persistently <60 mL/min signify possible Chronic Kidney Disease.    Anion gap 11 5 - 15  Hepatic function panel     Status: Abnormal   Collection Time: 07/28/16  7:44 AM  Result Value Ref Range   Total Protein 6.9 6.5 - 8.1 g/dL   Albumin 3.1 (L) 3.5 - 5.0 g/dL   AST 29 15 - 41 U/L   ALT 21 14 - 54 U/L   Alkaline Phosphatase 89 38 - 126 U/L   Total Bilirubin 0.5 0.3 - 1.2 mg/dL   Bilirubin, Direct 0.2 0.1 - 0.5 mg/dL   Indirect Bilirubin 0.3 0.3 - 0.9 mg/dL  Lipase, blood      Status: None   Collection Time: 07/28/16  7:44 AM  Result Value Ref Range   Lipase 20 11 - 51 U/L   Ct Abdomen Pelvis W Contrast  Result Date: 07/28/2016 CLINICAL DATA:  Back pain, dysuria, leukocytosis EXAM: CT ABDOMEN AND PELVIS WITH CONTRAST TECHNIQUE: Multidetector CT imaging of the abdomen and pelvis was performed using the standard protocol following bolus administration of intravenous contrast. CONTRAST:  182m ISOVUE-300 IOPAMIDOL (ISOVUE-300) INJECTION 61% COMPARISON:  01/09/2011 FINDINGS: Lower chest: Lung bases are clear. Hepatobiliary: 3.5 cm hemangioma along the right hepatic dome (series 3/ image 13), incompletely visualized. Additional smaller lesions are poorly evaluated. Gallbladder is unremarkable. No intrahepatic or extrahepatic ductal dilatation. Pancreas: Within normal limits. Spleen: Within normal limits. Adrenals/Urinary Tract: Adrenal glands are within normal limits. Right kidney is within normal limits. Two nonobstructing left lower pole renal calculi measuring up to 5 mm (coronal image 68). No ureteral or bladder calculi. No hydronephrosis. Bladder is notable for a 9.8 x 5.4 x 4.5 cm intramural abscess along the left superior bladder dome (series 3/ image 68; coronal image 65). Stomach/Bowel: Stomach is within normal limits. No evidence of bowel obstruction. Normal appendix (series 3/ image 60). Suspected fistula between the sigmoid colon and the intramural bladder abscess (coronal image 64). Vascular/Lymphatic: No evidence of abdominal aortic aneurysm. Atherosclerotic calcifications of the abdominal aorta and branch vessels. Small retroperitoneal lymph nodes which do not meet pathologic CT size criteria. Reproductive: Status post hysterectomy. Right ovary is within normal limits. Intramural bladder abscess is difficult to separate from the left ovary (series 3/image 64). Other: No abdominopelvic ascites. No free air. Musculoskeletal: Degenerative changes of the visualized  thoracolumbar spine. IMPRESSION: 9.8 x 5.4 x 4.5 cm intramural abscess along the left superior bladder dome. Associated fistulous communication with the sigmoid colon. Possible involvement of the left ovary. No ascites or free air. Electronically Signed   By: SJulian HyM.D.   On: 07/28/2016 09:06      Assessment/Plan  Bladder  wall abscess with fistula to sigmoid colon - EDP is consulting urology - colon does not look inflamed on CT, possible fistula between abscess and colon, colonoscopy on 07/25/16 did not mention any active diverticulitis - no indication for general surgery intervention at this time - possible ovary involvement, may require GYN consult  Would recommend NPO with chips to allow for bowel rest in the event this is related to diverticulitis We will continue to follow this pt Thank you for the consult.   Kalman Drape, [facility] Surgery 07/28/2016, 11:21 AM Pager: [phone] Consults: [phone] Mon-Fri 7:00 am-4:30 pm Sat-Sun 7:00 am-11:30 am

## 2016-07-28 NOTE — ED Triage Notes (Signed)
Pt c/o dysuria, back pain and weakness ongoing since 07/14/16, seen twice already for same. No change in symptoms.

## 2016-07-28 NOTE — Progress Notes (Signed)
Received patient from ED accompanied by her husband.  Patient AOx4, ambulatory, VS stable, pain at 2/10 and O2Sat at 99% on RA.  Oriented patient to room, bed controls and call light.  Patient then took a shower.  Will continue to monitor.

## 2016-07-29 ENCOUNTER — Encounter (HOSPITAL_COMMUNITY): Payer: Self-pay | Admitting: *Deleted

## 2016-07-29 ENCOUNTER — Inpatient Hospital Stay (HOSPITAL_COMMUNITY): Payer: Commercial Managed Care - PPO

## 2016-07-29 LAB — CBC
HCT: 37.4 % (ref 36.0–46.0)
Hemoglobin: 11.8 g/dL — ABNORMAL LOW (ref 12.0–15.0)
MCH: 28.6 pg (ref 26.0–34.0)
MCHC: 31.6 g/dL (ref 30.0–36.0)
MCV: 90.6 fL (ref 78.0–100.0)
Platelets: 317 10*3/uL (ref 150–400)
RBC: 4.13 MIL/uL (ref 3.87–5.11)
RDW: 13.4 % (ref 11.5–15.5)
WBC: 16.6 10*3/uL — ABNORMAL HIGH (ref 4.0–10.5)

## 2016-07-29 LAB — POTASSIUM: Potassium: 3 mmol/L — ABNORMAL LOW (ref 3.5–5.1)

## 2016-07-29 LAB — COMPREHENSIVE METABOLIC PANEL
ALT: 18 U/L (ref 14–54)
AST: 24 U/L (ref 15–41)
Albumin: 2.7 g/dL — ABNORMAL LOW (ref 3.5–5.0)
Alkaline Phosphatase: 85 U/L (ref 38–126)
Anion gap: 11 (ref 5–15)
BUN: 9 mg/dL (ref 6–20)
CO2: 29 mmol/L (ref 22–32)
Calcium: 8.4 mg/dL — ABNORMAL LOW (ref 8.9–10.3)
Chloride: 97 mmol/L — ABNORMAL LOW (ref 101–111)
Creatinine, Ser: 0.87 mg/dL (ref 0.44–1.00)
GFR calc Af Amer: >60 mL/min (ref >60)
GFR calc non Af Amer: >60 mL/min (ref >60)
Glucose, Bld: 102 mg/dL — ABNORMAL HIGH (ref 65–99)
Potassium: 2.5 mmol/L — CL (ref 3.5–5.1)
Sodium: 137 mmol/L (ref 135–145)
Total Bilirubin: 0.5 mg/dL (ref 0.3–1.2)
Total Protein: 6.5 g/dL (ref 6.5–8.1)

## 2016-07-29 LAB — URINE CULTURE

## 2016-07-29 MED ORDER — FENTANYL CITRATE (PF) 100 MCG/2ML IJ SOLN
INTRAMUSCULAR | Status: AC
  Filled 2016-07-29: qty 2

## 2016-07-29 MED ORDER — FENTANYL CITRATE (PF) 100 MCG/2ML IJ SOLN
INTRAMUSCULAR | Status: AC
Start: 2016-07-29 — End: 2016-07-30
  Filled 2016-07-29: qty 2

## 2016-07-29 MED ORDER — POTASSIUM CHLORIDE CRYS ER 20 MEQ PO TBCR: 50.0000 meq | EXTENDED_RELEASE_TABLET | Freq: Once | ORAL | Status: DC

## 2016-07-29 MED ORDER — MIDAZOLAM HCL 2 MG/2ML IJ SOLN
INTRAMUSCULAR | Status: AC | PRN
  Administered 2016-07-29 (×4): 1 mg via INTRAVENOUS

## 2016-07-29 MED ORDER — IOPAMIDOL (ISOVUE-300) INJECTION 61%
INTRAVENOUS | Status: AC
  Filled 2016-07-29: qty 50

## 2016-07-29 MED ORDER — MIDAZOLAM HCL 2 MG/2ML IJ SOLN
INTRAMUSCULAR | Status: AC
  Filled 2016-07-29: qty 2

## 2016-07-29 MED ORDER — POTASSIUM CHLORIDE 10 MEQ/100ML IV SOLN
10.0000 meq | INTRAVENOUS | Status: DC
  Administered 2016-07-29: 10 meq via INTRAVENOUS
  Filled 2016-07-29: qty 100

## 2016-07-29 MED ORDER — FENTANYL CITRATE (PF) 100 MCG/2ML IJ SOLN
INTRAMUSCULAR | Status: AC | PRN
  Administered 2016-07-29: 25 ug via INTRAVENOUS
  Administered 2016-07-29: 50 ug via INTRAVENOUS
  Administered 2016-07-29: 25 ug via INTRAVENOUS
  Administered 2016-07-29: 50 ug via INTRAVENOUS

## 2016-07-29 MED ORDER — LIDOCAINE HCL (PF) 1 % IJ SOLN
INTRAMUSCULAR | Status: AC
  Filled 2016-07-29: qty 30

## 2016-07-29 MED ORDER — BISACODYL 10 MG RE SUPP: 10.0000 mg | Freq: Every day | RECTAL | Status: DC | PRN

## 2016-07-29 MED ORDER — POTASSIUM CHLORIDE CRYS ER 20 MEQ PO TBCR
50.0000 meq | EXTENDED_RELEASE_TABLET | Freq: Once | ORAL | Status: AC
  Administered 2016-07-29: 50 meq via ORAL
  Filled 2016-07-29: qty 1

## 2016-07-29 MED ORDER — ONDANSETRON HCL 4 MG/2ML IJ SOLN: 4.0000 mg | Freq: Four times a day (QID) | INTRAMUSCULAR | Status: DC | PRN

## 2016-07-29 NOTE — Progress Notes (Signed)
Patient ID: [name], female   DOB: February 11, 1960, 56 y.o.   MRN: [id number]                                                                PROGRESS NOTE                                                                                                                                                                                                             Patient Demographics:    [name], is a 56 y.o. female, DOB - 03-Jul-1960, URK:[id number]  Admit date - 07/28/2016   Admitting Physician Waldemar Dickens, MD  Outpatient Primary MD for the patient is Biagio Borg, MD  LOS - 1  Outpatient Specialists:     Chief Complaint  Patient presents with  . Back Pain  . Dysuria       Brief Narrative  56 y.o. female with medical history significant for obesity, asthma, hypertension, dyslipidemia, history of diverticulitis with known pancolonic diverticula based on recent colonoscopy on 07/25/16, and GERD who presented with continued dysuria, urinary frequency with urgency as well as subjective fevers and suprapubic and low back pain despite treatment with antibiotics and Pyridium. Patient was initially seen in the ER on 6/4 and was prescribed Keflex. She returned to the ER on 6/7 due to non-improvement in symptoms and was given Pyridium. Initial urine culture and repeat culture on 6/15 with multiple species present. Today patient had leukocytosis with white count of 16,900. CT abdomen and pelvis today revealed a 9.8 x 5.4 x 4.5 internal abscess on the left superior bladder dome with the appearance of an associated fistulous communication with the sigmoid colon as well as possible involvement of the left ovary. Patient has been evaluated by general surgery and urology while in the ER and at this time no definitive surgical treatment has been deemed necessary. We have been asked to admit for medical management of bladder wall abscess of indeterminate etiology. In retrospect, patient reports that  beginning in mid May she noticed painful urination and by the time she presented in June she began having back pain and suprapubic pain.  ED Course:  Vital Signs: BP 133/81   Pulse 98   Temp 98.1 F (36.7 C) (Oral)   Resp 18  Ht 5\' 1"  (1.549 m)   Wt 133.8 kg (295 lb)   SpO2 98%   BMI 55.74 kg/m  2 view CXR: Pulmonary vascular prominence and venous hypertensive changes without overt pulmonary edema or pleural effusions CT abdomen and pelvis with contrast: As above Lab data: Sodium 139, potassium 3.0, chloride 101, CO2 27, glucose 98, BUN 11, creatinine 0.82, anion gap 11, LFTs normal, albumin 3.1, white count 16,900 differential not obtained, urinalysis abnormal with hazy appearance, rare bacteria, amber color, 100 protein Medications and treatments: Normal saline bolus 1 L, Toradol 15 mg IV 1, Zosyn 3.375 g IV 1, Vicodin 5-3 252 tablets    Subjective:    [name] today has minimal lower abdominal discomfort.  Afebrile. Denies dysuria, , hematuria. IR percutaneous drainage pending today.   No headache, No chest pain, No Nausea, No new weakness tingling or numbness, No Cough - SOB.    Assessment  & Plan :    Principal Problem:   Abscess of bladder Active Problems:   Hypertension   Asthma   Alcohol abuse   Obesity (BMI 30-39.9)   Colonic diverticulum   Acute hypokalemia   HLD (hyperlipidemia)   Abscess of bladder/Colonic diverticulum -Patient presents with ongoing UTI type symptoms with associated suprapubic and low back pain not improved with antibiotic therapy --CT imaging revealed large intramural abscess along the left superior bladder dome with possible involvement of the left ovary with concerns over possible colonic fistula -Known history of diverticulitis with recent colonoscopy negative for acute diverticulitis but demonstrating pancolonic diverticula -Gen. surgery and urology consulted  -IR consulted; plans are to pursue percutaneous drainage and culture of  abscess with contrast injection to determine if fistulous connection with colon -NPO after midnight-clear liquids will be absorbed in the small intestine -Normal saline IV fluids -Combination oral and IV narcotics for pain management -cont Zosyn IV 3.375 g every 8 hours  Hypokalemia Cmp pending this am, may need repletion    Hypertension -Continue preadmission losartan -Holding thiazide diuretic portion    Alcohol abuse -Patient reports social drinking only without daily ingestion -Last drank this past Saturday at a friend's birthday party    Acute hypokalemia -Likely secondary to thiazide diuretic as well as poor oral intake prior to admission -Oral repletion -Follow labs    Asthma -Continue preadmission MDIs -Currently stable without active wheezing or reports of shortness of breath    Obesity (BMI 30-39.9) -Defer weight reduction strategies to PCP    HLD (hyperlipidemia) -Hold preadmission statin until diet advanced      DVT prophylaxis: Lovenox 1 dose now then resume dosing at 6 PM tomorrow post IR procedure  Code Status: Full  Family Communication: w patient Disposition Plan: Home Consults called: Gen. Surgery/Tsuei; Urology/Wrenn; IR/Hoss     Lab Results  Component Value Date   PLT 317 07/29/2016    Antibiotics  :  Zosyn 6/18=>  Anti-infectives    Start     Dose/Rate Route Frequency Ordered Stop   07/28/16 2200  piperacillin-tazobactam (ZOSYN) IVPB 3.375 g  Status:  Discontinued     3.375 g 100 mL/hr over 30 Minutes Intravenous Every 8 hours 07/28/16 2001 07/28/16 2102   07/28/16 2200  piperacillin-tazobactam (ZOSYN) IVPB 3.375 g     3.375 g 12.5 mL/hr over 240 Minutes Intravenous Every 8 hours 07/28/16 2102     07/28/16 1015  piperacillin-tazobactam (ZOSYN) IVPB 3.375 g     3.375 g 12.5 mL/hr over 240 Minutes Intravenous  Once 07/28/16 1005 07/28/16  1453        Objective:   Vitals:   07/28/16 1922 07/28/16 2011 07/28/16 2056  07/29/16 0527  BP: 127/74 140/80 132/89 122/73  Pulse: 89 94 91 80  Resp: 16 18 18 18   Temp: 98.2 F (36.8 C) 98.4 F (36.9 C) 99.1 F (37.3 C) 98.6 F (37 C)  TempSrc: Oral Oral Oral Oral  SpO2: 95% 95% 99% 95%  Weight:      Height:        Wt Readings from Last 3 Encounters:  07/28/16 133.8 kg (295 lb)  07/25/16 132.9 kg (293 lb)  07/10/16 135.6 kg (299 lb)     Intake/Output Summary (Last 24 hours) at 07/29/16 0642 Last data filed at 07/29/16 0531  Gross per 24 hour  Intake           3267.5 ml  Output              625 ml  Net           2642.5 ml     Physical Exam  Awake Alert, Oriented X 3, No new F.N deficits, Normal affect [place].AT,PERRAL Supple Neck,No JVD, No cervical lymphadenopathy appriciated.  Symmetrical Chest wall movement, Good air movement bilaterally, CTAB RRR,No Gallops,Rubs or new Murmurs, No Parasternal Heave +ve B.Sounds, Abd Soft, No tenderness, No organomegaly appriciated, No rebound - guarding or rigidity. No Cyanosis, Clubbing or edema, No new Rash or bruise      Data Review:    CBC  Recent Labs Lab 07/28/16 0600 07/29/16 0505  WBC 16.9* 16.6*  HGB 13.4 11.8*  HCT 41.6 37.4  PLT 339 317  MCV 90.4 90.6  MCH 29.1 28.6  MCHC 32.2 31.6  RDW 13.6 13.4    Chemistries   Recent Labs Lab 07/28/16 0600 07/28/16 0744 07/28/16 2125  NA 139  --   --   K 3.0*  --   --   CL 101  --   --   CO2 27  --   --   GLUCOSE 98  --   --   BUN 11  --   --   CREATININE 0.82  --   --   CALCIUM 9.0  --   --   MG  --   --  2.1  AST  --  29  --   ALT  --  21  --   ALKPHOS  --  89  --   BILITOT  --  0.5  --    ------------------------------------------------------------------------------------------------------------------ No results for input(s): CHOL, HDL, LDLCALC, TRIG, CHOLHDL, LDLDIRECT in the last 72 hours.  Lab Results  Component Value Date   HGBA1C 5.9 04/17/2016    ------------------------------------------------------------------------------------------------------------------ No results for input(s): TSH, T4TOTAL, T3FREE, THYROIDAB in the last 72 hours.  Invalid input(s): FREET3 ------------------------------------------------------------------------------------------------------------------ No results for input(s): VITAMINB12, FOLATE, FERRITIN, TIBC, IRON, RETICCTPCT in the last 72 hours.  Coagulation profile  Recent Labs Lab 07/28/16 1400  INR 1.11    No results for input(s): DDIMER in the last 72 hours.  Cardiac Enzymes No results for input(s): CKMB, TROPONINI, MYOGLOBIN in the last 168 hours.  Invalid input(s): CK ------------------------------------------------------------------------------------------------------------------    Component Value Date/Time   BNP 27.0 04/18/2016 1201    Inpatient Medications  Scheduled Meds: . enoxaparin (LOVENOX) injection  40 mg Subcutaneous Q24H  . losartan  100 mg Oral Daily  . mometasone-formoterol  2 puff Inhalation BID   Continuous Infusions: . sodium chloride 75 mL/hr at 07/28/16 2133  .  piperacillin-tazobactam (ZOSYN)  IV 3.375 g (07/29/16 0531)   PRN Meds:.albuterol, oxyCODONE  Micro Results Recent Results (from the past 240 hour(s))  Urine Culture     Status: None   Collection Time: 07/25/16  9:05 AM  Result Value Ref Range Status   Organism ID, Bacteria   Final    Multiple organisms present,each less than 10,000 CFU/mL. These organisms,commonly found on external and internal genitalia,are considered colonizers. No further testing performed.     Radiology Reports Ct Abdomen Pelvis W Contrast  Result Date: 07/28/2016 CLINICAL DATA:  Back pain, dysuria, leukocytosis EXAM: CT ABDOMEN AND PELVIS WITH CONTRAST TECHNIQUE: Multidetector CT imaging of the abdomen and pelvis was performed using the standard protocol following bolus administration of intravenous contrast.  CONTRAST:  154mL ISOVUE-300 IOPAMIDOL (ISOVUE-300) INJECTION 61% COMPARISON:  01/09/2011 FINDINGS: Lower chest: Lung bases are clear. Hepatobiliary: 3.5 cm hemangioma along the right hepatic dome (series 3/ image 13), incompletely visualized. Additional smaller lesions are poorly evaluated. Gallbladder is unremarkable. No intrahepatic or extrahepatic ductal dilatation. Pancreas: Within normal limits. Spleen: Within normal limits. Adrenals/Urinary Tract: Adrenal glands are within normal limits. Right kidney is within normal limits. Two nonobstructing left lower pole renal calculi measuring up to 5 mm (coronal image 68). No ureteral or bladder calculi. No hydronephrosis. Bladder is notable for a 9.8 x 5.4 x 4.5 cm intramural abscess along the left superior bladder dome (series 3/ image 68; coronal image 65). Stomach/Bowel: Stomach is within normal limits. No evidence of bowel obstruction. Normal appendix (series 3/ image 60). Suspected fistula between the sigmoid colon and the intramural bladder abscess (coronal image 64). Vascular/Lymphatic: No evidence of abdominal aortic aneurysm. Atherosclerotic calcifications of the abdominal aorta and branch vessels. Small retroperitoneal lymph nodes which do not meet pathologic CT size criteria. Reproductive: Status post hysterectomy. Right ovary is within normal limits. Intramural bladder abscess is difficult to separate from the left ovary (series 3/image 64). Other: No abdominopelvic ascites. No free air. Musculoskeletal: Degenerative changes of the visualized thoracolumbar spine. IMPRESSION: 9.8 x 5.4 x 4.5 cm intramural abscess along the left superior bladder dome. Associated fistulous communication with the sigmoid colon. Possible involvement of the left ovary. No ascites or free air. Electronically Signed   By: Julian Hy M.D.   On: 07/28/2016 09:06    Time Spent in minutes  30   Jani Gravel M.D on 07/29/2016 at 6:42 AM  Between 7am to 7pm - Pager -  [phone]  After 7pm go to [email or web address] - password [facility]  [facility] -  Office  [phone]

## 2016-07-29 NOTE — Telephone Encounter (Signed)
Spoke with pt. She said that she was just wanting to let you know what all was going on. I told her that you do have access to her chart and what they are doing while she is in the hospital. She did not have any further questions and said that she would schedule a follow up with you when she has been discharged.

## 2016-07-29 NOTE — Progress Notes (Signed)
CRITICAL VALUE ALERT  Critical Value: K=2.5  Date & Time Notied:  07/29/16  Provider Notified: Dr. Georges Mouse  Orders Received/Actions taken: Potassium Chloride 10 mEq in 100 mL IVPB every 1 hr x 5.

## 2016-07-29 NOTE — Progress Notes (Signed)
Patient ID: [name], female   DOB: 05/30/1960, 56 y.o.   MRN: [id number]  [facility] Surgery Progress Note     Subjective: CC- dysuria Patient did not have procedure yesterday due to PO liquids. She is NPO today and planning for perc drainage of abscess in IR. States that she is feeling slightly better. She continues to have dysuria. Denies abdominal pain, nausea, or vomiting.  Objective: Vital signs in last 24 hours: Temp:  [98.2 F (36.8 C)-99.1 F (37.3 C)] 98.6 F (37 C) (06/19 0527) Pulse Rate:  [80-105] 80 (06/19 0527) Resp:  [15-18] 18 (06/19 0527) BP: (106-145)/(56-92) 122/73 (06/19 0527) SpO2:  [91 %-99 %] 95 % (06/19 0527) Last BM Date: 07/28/16  Intake/Output from previous day: 06/18 0701 - 06/19 0700 In: 3267.5 [P.O.:390; I.V.:597.5; IV Piggyback:2280] Out: 625 [Urine:625] Intake/Output this shift: No intake/output data recorded.  PE: Gen:  Alert, NAD, pleasant HEENT: EOM's intact, pupils equal  Card:  RRR, no M/G/R heard Pulm:  CTAB, no W/R/R, effort normal Abd: obese, soft, NT/ND, +BS, no HSM, no hernia Ext:  No erythema, edema, or tenderness BUE/BLE  Psych: A&Ox3   Lab Results:   Recent Labs  07/28/16 0600 07/29/16 0505  WBC 16.9* 16.6*  HGB 13.4 11.8*  HCT 41.6 37.4  PLT 339 317   BMET  Recent Labs  07/28/16 0600 07/29/16 0505  NA 139 137  K 3.0* 2.5*  CL 101 97*  CO2 27 29  GLUCOSE 98 102*  BUN 11 9  CREATININE 0.82 0.87  CALCIUM 9.0 8.4*   PT/INR  Recent Labs  07/28/16 1400  LABPROT 14.4  INR 1.11   CMP     Component Value Date/Time   NA 137 07/29/2016 0505   K 2.5 (LL) 07/29/2016 0505   CL 97 (L) 07/29/2016 0505   CO2 29 07/29/2016 0505   GLUCOSE 102 (H) 07/29/2016 0505   BUN 9 07/29/2016 0505   CREATININE 0.87 07/29/2016 0505   CALCIUM 8.4 (L) 07/29/2016 0505   PROT 6.5 07/29/2016 0505   ALBUMIN 2.7 (L) 07/29/2016 0505   AST 24 07/29/2016 0505   ALT 18 07/29/2016 0505   ALKPHOS 85 07/29/2016 0505    BILITOT 0.5 07/29/2016 0505   GFRNONAA >60 07/29/2016 0505   GFRAA >60 07/29/2016 0505   Lipase     Component Value Date/Time   LIPASE 20 07/28/2016 0744       Studies/Results: Ct Abdomen Pelvis W Contrast  Result Date: 07/28/2016 CLINICAL DATA:  Back pain, dysuria, leukocytosis EXAM: CT ABDOMEN AND PELVIS WITH CONTRAST TECHNIQUE: Multidetector CT imaging of the abdomen and pelvis was performed using the standard protocol following bolus administration of intravenous contrast. CONTRAST:  171mL ISOVUE-300 IOPAMIDOL (ISOVUE-300) INJECTION 61% COMPARISON:  01/09/2011 FINDINGS: Lower chest: Lung bases are clear. Hepatobiliary: 3.5 cm hemangioma along the right hepatic dome (series 3/ image 13), incompletely visualized. Additional smaller lesions are poorly evaluated. Gallbladder is unremarkable. No intrahepatic or extrahepatic ductal dilatation. Pancreas: Within normal limits. Spleen: Within normal limits. Adrenals/Urinary Tract: Adrenal glands are within normal limits. Right kidney is within normal limits. Two nonobstructing left lower pole renal calculi measuring up to 5 mm (coronal image 68). No ureteral or bladder calculi. No hydronephrosis. Bladder is notable for a 9.8 x 5.4 x 4.5 cm intramural abscess along the left superior bladder dome (series 3/ image 68; coronal image 65). Stomach/Bowel: Stomach is within normal limits. No evidence of bowel obstruction. Normal appendix (series 3/ image 60). Suspected fistula between  the sigmoid colon and the intramural bladder abscess (coronal image 64). Vascular/Lymphatic: No evidence of abdominal aortic aneurysm. Atherosclerotic calcifications of the abdominal aorta and branch vessels. Small retroperitoneal lymph nodes which do not meet pathologic CT size criteria. Reproductive: Status post hysterectomy. Right ovary is within normal limits. Intramural bladder abscess is difficult to separate from the left ovary (series 3/image 64). Other: No abdominopelvic  ascites. No free air. Musculoskeletal: Degenerative changes of the visualized thoracolumbar spine. IMPRESSION: 9.8 x 5.4 x 4.5 cm intramural abscess along the left superior bladder dome. Associated fistulous communication with the sigmoid colon. Possible involvement of the left ovary. No ascites or free air. Electronically Signed   By: Julian Hy M.D.   On: 07/28/2016 09:06    Anti-infectives: Anti-infectives    Start     Dose/Rate Route Frequency Ordered Stop   07/28/16 2200  piperacillin-tazobactam (ZOSYN) IVPB 3.375 g  Status:  Discontinued     3.375 g 100 mL/hr over 30 Minutes Intravenous Every 8 hours 07/28/16 2001 07/28/16 2102   07/28/16 2200  piperacillin-tazobactam (ZOSYN) IVPB 3.375 g     3.375 g 12.5 mL/hr over 240 Minutes Intravenous Every 8 hours 07/28/16 2102     07/28/16 1015  piperacillin-tazobactam (ZOSYN) IVPB 3.375 g     3.375 g 12.5 mL/hr over 240 Minutes Intravenous  Once 07/28/16 1005 07/28/16 1453       Assessment/Plan HTN Asthma Obesity HLD Hypokalemia  Bladder wall abscess with fistula to sigmoid colon - colon does not look inflamed on CT, possible fistula between abscess and colon, colonoscopy on 07/25/16 did not mention any active diverticulitis - no indication for general surgery intervention at this time - possible ovary involvement, may require GYN consult - scheduled for abscess drainage in IR today; definitely needs urology consult  ID - zosyn 6/18>> FEN - IVF, NPO VTE - SCDs, lovenox (holding for procedure)  Plan - continue NPO and IV antibiotics. For perc drain today in IR. Coggon for clear liquids after procedure from our standpoint. Will continue to follow.    LOS: 1 day    Jerrye Beavers , [facility] Surgery 07/29/2016, 7:57 AM Pager: [phone] Consults: [phone] Mon-Fri 7:00 am-4:30 pm Sat-Sun 7:00 am-11:30 am  Imogene Burn. Georgette Dover, MD, [facility] Surgery  General/ Trauma Surgery  07/29/2016 10:35  AM

## 2016-07-29 NOTE — Telephone Encounter (Signed)
Ok, sounds good, thanks!

## 2016-07-29 NOTE — Consult Note (Signed)
Reason for Consult: Bladder Wall - Colon Abscess, Nephrolithiasis  Referring Physician: Jani Gravel MD  [name] is an 56 y.o. female.   HPI:   1 - Bladder Wall - Diverticular - dome intramural bladder fluid collection with connection to sigmoid / diverticulosis tract by ER CT 07/2016 on eval dysura and pelvic pain most c/w diverticular abscess. UA, UCX unimpressive for frank fistula. No CX from fluid connection yet, IR drainage pending. Deneis h/o colon CA / pelvic radiation. No fecaluria / pneumaturia. CT goided IR drain placed 6/19 and no new hematuria / fecaluria after.   2 - Nephrolithiasis - few incidetnal LLP punctate stones on ER CT 07/2016. NO h/o colic. NO hydro.  PMH sig for morbid obesity, diverticulosis, vesicovaginal fistula closure.   Today "[name]" is seen in consultation for above.   Past Medical History:  Diagnosis Date  . Alcohol abuse    abuse- moderate years ago - states only drinks now on weekends -2 to 8 driinks   . Asthma   . COLONIC POLYPS, HX OF 04/05/2010  . DIVERTICULITIS, HX OF 04/05/2010  . DJD (degenerative joint disease)    right knee, mot to severe  . GERD (gastroesophageal reflux disease)    no meds  . Heart murmur    hx of   . Hyperlipidemia   . Hypertension   . Impaired glucose tolerance 12/06/2013  . Obesity (BMI 30-39.9)   . PALPITATIONS, HX OF 09/14/2007    Past Surgical History:  Procedure Laterality Date  . ABDOMINAL HYSTERECTOMY    . COLONOSCOPY WITH PROPOFOL N/A 07/25/2016   Procedure: COLONOSCOPY WITH PROPOFOL;  Surgeon: Carol Ada, MD;  Location: [facility];  Service: Endoscopy;  Laterality: N/A;  . colonscopy     x 2  . KNEE ARTHROSCOPY     left   . TOTAL KNEE ARTHROPLASTY  07/29/2011   Procedure: TOTAL KNEE ARTHROPLASTY;  Surgeon: Mauri Pole, MD;  Location: [facility];  Service: Orthopedics;  Laterality: Right;  . TOTAL KNEE ARTHROPLASTY Left 12/14/2012   Procedure: LEFT TOTAL KNEE ARTHROPLASTY;  Surgeon: Mauri Pole,  MD;  Location: [facility];  Service: Orthopedics;  Laterality: Left;  Marland Kitchen VESICOVAGINAL FISTULA CLOSURE W/ TAH      Family History  Problem Relation Age of Onset  . Stroke Mother   . COPD Father   . Lymphoma Sister     Social History:  reports that she quit smoking about 28 years ago. Her smoking use included Cigarettes. She has a 3.50 pack-year smoking history. She has never used smokeless tobacco. She reports that she does not drink alcohol or use drugs.  Allergies:  Allergies  Allergen Reactions  . Morphine Itching  . Shrimp [Shellfish Allergy] Itching    Tongue burns  . Tramadol Other (See Comments)    confusion  . Latex Rash    Medications: I have reviewed the patient's current medications.  Results for orders placed or performed during the hospital encounter of 07/28/16 (from the past 48 hour(s))  Urine culture     Status: Abnormal   Collection Time: 07/28/16  5:39 AM  Result Value Ref Range   Specimen Description URINE, CLEAN CATCH    Special Requests NONE    Culture MULTIPLE SPECIES PRESENT, SUGGEST RECOLLECTION (A)    Report Status 07/29/2016 FINAL   Urinalysis, Routine w reflex microscopic     Status: Abnormal   Collection Time: 07/28/16  5:57 AM  Result Value Ref Range   Color, Urine AMBER (  A) YELLOW    Comment: BIOCHEMICALS MAY BE AFFECTED BY COLOR   APPearance HAZY (A) CLEAR   Specific Gravity, Urine 1.020 1.005 - 1.030   pH 5.0 5.0 - 8.0   Glucose, UA NEGATIVE NEGATIVE mg/dL   Hgb urine dipstick NEGATIVE NEGATIVE   Bilirubin Urine NEGATIVE NEGATIVE   Ketones, ur NEGATIVE NEGATIVE mg/dL   Protein, ur 100 (A) NEGATIVE mg/dL   Nitrite NEGATIVE NEGATIVE   Leukocytes, UA NEGATIVE NEGATIVE   RBC / HPF 0-5 0 - 5 RBC/hpf   WBC, UA 0-5 0 - 5 WBC/hpf   Bacteria, UA RARE (A) NONE SEEN   Squamous Epithelial / LPF 0-5 (A) NONE SEEN   Mucous PRESENT   CBC     Status: Abnormal   Collection Time: 07/28/16  6:00 AM  Result Value Ref Range   WBC 16.9 (H) 4.0 - 10.5 K/uL    RBC 4.60 3.87 - 5.11 MIL/uL   Hemoglobin 13.4 12.0 - 15.0 g/dL   HCT 41.6 36.0 - 46.0 %   MCV 90.4 78.0 - 100.0 fL   MCH 29.1 26.0 - 34.0 pg   MCHC 32.2 30.0 - 36.0 g/dL   RDW 13.6 11.5 - 15.5 %   Platelets 339 150 - 400 K/uL  Basic metabolic panel     Status: Abnormal   Collection Time: 07/28/16  6:00 AM  Result Value Ref Range   Sodium 139 135 - 145 mmol/L   Potassium 3.0 (L) 3.5 - 5.1 mmol/L   Chloride 101 101 - 111 mmol/L   CO2 27 22 - 32 mmol/L   Glucose, Bld 98 65 - 99 mg/dL   BUN 11 6 - 20 mg/dL   Creatinine, Ser 0.82 0.44 - 1.00 mg/dL   Calcium 9.0 8.9 - 10.3 mg/dL   GFR calc non Af Amer >60 >60 mL/min   GFR calc Af Amer >60 >60 mL/min    Comment: (NOTE) The eGFR has been calculated using the CKD EPI equation. This calculation has not been validated in all clinical situations. eGFR's persistently <60 mL/min signify possible Chronic Kidney Disease.    Anion gap 11 5 - 15  Hepatic function panel     Status: Abnormal   Collection Time: 07/28/16  7:44 AM  Result Value Ref Range   Total Protein 6.9 6.5 - 8.1 g/dL   Albumin 3.1 (L) 3.5 - 5.0 g/dL   AST 29 15 - 41 U/L   ALT 21 14 - 54 U/L   Alkaline Phosphatase 89 38 - 126 U/L   Total Bilirubin 0.5 0.3 - 1.2 mg/dL   Bilirubin, Direct 0.2 0.1 - 0.5 mg/dL   Indirect Bilirubin 0.3 0.3 - 0.9 mg/dL  Lipase, blood     Status: None   Collection Time: 07/28/16  7:44 AM  Result Value Ref Range   Lipase 20 11 - 51 U/L  Protime-INR     Status: None   Collection Time: 07/28/16  2:00 PM  Result Value Ref Range   Prothrombin Time 14.4 11.4 - 15.2 seconds   INR 1.11   APTT     Status: None   Collection Time: 07/28/16  2:00 PM  Result Value Ref Range   aPTT 29 24 - 36 seconds  Magnesium     Status: None   Collection Time: 07/28/16  9:25 PM  Result Value Ref Range   Magnesium 2.1 1.7 - 2.4 mg/dL  Phosphorus     Status: None   Collection Time: 07/28/16  9:25  PM  Result Value Ref Range   Phosphorus 3.1 2.5 - 4.6 mg/dL   Comprehensive metabolic panel     Status: Abnormal   Collection Time: 07/29/16  5:05 AM  Result Value Ref Range   Sodium 137 135 - 145 mmol/L   Potassium 2.5 (LL) 3.5 - 5.1 mmol/L    Comment: CRITICAL RESULT CALLED TO, READ BACK BY AND VERIFIED WITH: C.SISON,RN 07/29/16 0644 BY BSLADE    Chloride 97 (L) 101 - 111 mmol/L   CO2 29 22 - 32 mmol/L   Glucose, Bld 102 (H) 65 - 99 mg/dL   BUN 9 6 - 20 mg/dL   Creatinine, Ser 0.87 0.44 - 1.00 mg/dL   Calcium 8.4 (L) 8.9 - 10.3 mg/dL   Total Protein 6.5 6.5 - 8.1 g/dL   Albumin 2.7 (L) 3.5 - 5.0 g/dL   AST 24 15 - 41 U/L   ALT 18 14 - 54 U/L   Alkaline Phosphatase 85 38 - 126 U/L   Total Bilirubin 0.5 0.3 - 1.2 mg/dL   GFR calc non Af Amer >60 >60 mL/min   GFR calc Af Amer >60 >60 mL/min    Comment: (NOTE) The eGFR has been calculated using the CKD EPI equation. This calculation has not been validated in all clinical situations. eGFR's persistently <60 mL/min signify possible Chronic Kidney Disease.    Anion gap 11 5 - 15  CBC     Status: Abnormal   Collection Time: 07/29/16  5:05 AM  Result Value Ref Range   WBC 16.6 (H) 4.0 - 10.5 K/uL   RBC 4.13 3.87 - 5.11 MIL/uL   Hemoglobin 11.8 (L) 12.0 - 15.0 g/dL   HCT 37.4 36.0 - 46.0 %   MCV 90.6 78.0 - 100.0 fL   MCH 28.6 26.0 - 34.0 pg   MCHC 31.6 30.0 - 36.0 g/dL   RDW 13.4 11.5 - 15.5 %   Platelets 317 150 - 400 K/uL    Ct Abdomen Pelvis W Contrast  Result Date: 07/28/2016 CLINICAL DATA:  Back pain, dysuria, leukocytosis EXAM: CT ABDOMEN AND PELVIS WITH CONTRAST TECHNIQUE: Multidetector CT imaging of the abdomen and pelvis was performed using the standard protocol following bolus administration of intravenous contrast. CONTRAST:  145m ISOVUE-300 IOPAMIDOL (ISOVUE-300) INJECTION 61% COMPARISON:  01/09/2011 FINDINGS: Lower chest: Lung bases are clear. Hepatobiliary: 3.5 cm hemangioma along the right hepatic dome (series 3/ image 13), incompletely visualized. Additional smaller  lesions are poorly evaluated. Gallbladder is unremarkable. No intrahepatic or extrahepatic ductal dilatation. Pancreas: Within normal limits. Spleen: Within normal limits. Adrenals/Urinary Tract: Adrenal glands are within normal limits. Right kidney is within normal limits. Two nonobstructing left lower pole renal calculi measuring up to 5 mm (coronal image 68). No ureteral or bladder calculi. No hydronephrosis. Bladder is notable for a 9.8 x 5.4 x 4.5 cm intramural abscess along the left superior bladder dome (series 3/ image 68; coronal image 65). Stomach/Bowel: Stomach is within normal limits. No evidence of bowel obstruction. Normal appendix (series 3/ image 60). Suspected fistula between the sigmoid colon and the intramural bladder abscess (coronal image 64). Vascular/Lymphatic: No evidence of abdominal aortic aneurysm. Atherosclerotic calcifications of the abdominal aorta and branch vessels. Small retroperitoneal lymph nodes which do not meet pathologic CT size criteria. Reproductive: Status post hysterectomy. Right ovary is within normal limits. Intramural bladder abscess is difficult to separate from the left ovary (series 3/image 64). Other: No abdominopelvic ascites. No free air. Musculoskeletal: Degenerative changes of the visualized thoracolumbar  spine. IMPRESSION: 9.8 x 5.4 x 4.5 cm intramural abscess along the left superior bladder dome. Associated fistulous communication with the sigmoid colon. Possible involvement of the left ovary. No ascites or free air. Electronically Signed   By: Julian Hy M.D.   On: 07/28/2016 09:06    Review of Systems  Constitutional: Positive for fever and malaise/fatigue.  Eyes: Negative.   Respiratory: Negative.   Cardiovascular: Negative.   Gastrointestinal: Positive for abdominal pain and nausea.  Genitourinary: Negative for flank pain and hematuria.  Musculoskeletal: Negative.   Skin: Negative.   Neurological: Negative.   Endo/Heme/Allergies:  Negative.   Psychiatric/Behavioral: Negative.    Blood pressure 122/73, pulse 80, temperature 98.6 F (37 C), temperature source Oral, resp. rate 18, height _0  (1.549 m), weight 133.8 kg (295 lb), SpO2 95 %. Physical Exam  Constitutional: She appears well-developed.  HENT:  Head: Normocephalic.  Neck: Normal range of motion.  Respiratory: Effort normal.  GI: Soft.  Morbid truncal obesity. IR drain in place to bulb suction with bloody / purlulent material, NOT uriniferous.   Genitourinary:  Genitourinary Comments: NO CVAT.   Neurological: She is alert.  Skin: Skin is warm.  Psychiatric: She has a normal mood and affect.    Assessment/Plan:  1 - Bladder Wall - Diverticular - agree with very careful IR drainage for fluid sampling, CX, and to help resolve. She does not have a frank intraluminal bladdder fistula at this point but is at risk of developing. No foley for now. Agree with current ABX pending further abscess fluid data.  As long as no clinical fisutla develops with IR drainge, then rec only drain and we will arrange cysto as outpatient in few most to verify no underlying bladder lesions, though suspicion low.   2 - Nephrolithiasis - tiny non-obstructing stones, no intervention warrnated, opbserve.   We will arrange outpatient GU follow up for cysto. Please notify us if pt develops fecaluria or pneumaturia. She is aware as well.  Please call me directly with questions.   [name] 07/29/2016, 11:56 AM

## 2016-07-29 NOTE — Progress Notes (Signed)
Spoke with urology they agree with IR drainage, no surgical intervention at this time.  Appreciate their input.

## 2016-07-29 NOTE — Procedures (Signed)
10 Fr pelvic abscess drain Frank pus EBL 0 Comp 0

## 2016-07-30 DIAGNOSIS — N3080 Other cystitis without hematuria: Secondary | ICD-10-CM

## 2016-07-30 LAB — COMPREHENSIVE METABOLIC PANEL
ALT: 18 U/L (ref 14–54)
AST: 25 U/L (ref 15–41)
Albumin: 2.6 g/dL — ABNORMAL LOW (ref 3.5–5.0)
Alkaline Phosphatase: 73 U/L (ref 38–126)
Anion gap: 5 (ref 5–15)
BUN: 10 mg/dL (ref 6–20)
CO2: 30 mmol/L (ref 22–32)
Calcium: 8.4 mg/dL — ABNORMAL LOW (ref 8.9–10.3)
Chloride: 102 mmol/L (ref 101–111)
Creatinine, Ser: 1.06 mg/dL — ABNORMAL HIGH (ref 0.44–1.00)
GFR calc Af Amer: >60 mL/min (ref >60)
GFR calc non Af Amer: 58 mL/min — ABNORMAL LOW (ref >60)
Glucose, Bld: 155 mg/dL — ABNORMAL HIGH (ref 65–99)
Potassium: 2.9 mmol/L — ABNORMAL LOW (ref 3.5–5.1)
Sodium: 137 mmol/L (ref 135–145)
Total Bilirubin: 0.1 mg/dL — ABNORMAL LOW (ref 0.3–1.2)
Total Protein: 6.1 g/dL — ABNORMAL LOW (ref 6.5–8.1)

## 2016-07-30 LAB — URINALYSIS, ROUTINE W REFLEX MICROSCOPIC
Bilirubin Urine: NEGATIVE
Glucose, UA: NEGATIVE mg/dL
Hgb urine dipstick: NEGATIVE
Ketones, ur: NEGATIVE mg/dL
Leukocytes, UA: NEGATIVE
Nitrite: NEGATIVE
Protein, ur: NEGATIVE mg/dL
Specific Gravity, Urine: 1.012 (ref 1.005–1.030)
pH: 5 (ref 5.0–8.0)

## 2016-07-30 LAB — CBC
HCT: 36.7 % (ref 36.0–46.0)
Hemoglobin: 11.5 g/dL — ABNORMAL LOW (ref 12.0–15.0)
MCH: 28.7 pg (ref 26.0–34.0)
MCHC: 31.3 g/dL (ref 30.0–36.0)
MCV: 91.5 fL (ref 78.0–100.0)
Platelets: 315 10*3/uL (ref 150–400)
RBC: 4.01 MIL/uL (ref 3.87–5.11)
RDW: 13.4 % (ref 11.5–15.5)
WBC: 11.6 10*3/uL — ABNORMAL HIGH (ref 4.0–10.5)

## 2016-07-30 MED ORDER — POTASSIUM CHLORIDE CRYS ER 20 MEQ PO TBCR
20.0000 meq | EXTENDED_RELEASE_TABLET | Freq: Two times a day (BID) | ORAL | Status: DC
  Administered 2016-07-30 – 2016-08-02 (×7): 20 meq via ORAL
  Filled 2016-07-30 (×9): qty 1

## 2016-07-30 MED ORDER — DOCUSATE SODIUM 100 MG PO CAPS
100.0000 mg | ORAL_CAPSULE | Freq: Two times a day (BID) | ORAL | Status: DC
  Administered 2016-07-30 – 2016-08-01 (×5): 100 mg via ORAL
  Filled 2016-07-30 (×6): qty 1

## 2016-07-30 MED ORDER — POTASSIUM CHLORIDE IN NACL 40-0.9 MEQ/L-% IV SOLN
INTRAVENOUS | Status: DC
  Administered 2016-07-30: 60 mL/h via INTRAVENOUS
  Filled 2016-07-30 (×2): qty 1000

## 2016-07-30 MED ORDER — POTASSIUM CHLORIDE 10 MEQ/100ML IV SOLN
10.0000 meq | INTRAVENOUS | Status: DC
  Administered 2016-07-30: 10 meq via INTRAVENOUS
  Filled 2016-07-30: qty 100

## 2016-07-30 MED ORDER — HYDROCODONE-ACETAMINOPHEN 5-325 MG PO TABS
1.0000 | ORAL_TABLET | Freq: Four times a day (QID) | ORAL | Status: DC | PRN
  Administered 2016-07-30 – 2016-08-02 (×8): 2 via ORAL
  Filled 2016-07-30 (×8): qty 2

## 2016-07-30 MED ORDER — POLYETHYLENE GLYCOL 3350 17 G PO PACK
17.0000 g | PACK | Freq: Every day | ORAL | Status: DC
  Administered 2016-07-30 – 2016-08-01 (×2): 17 g via ORAL
  Filled 2016-07-30 (×3): qty 1

## 2016-07-30 NOTE — Progress Notes (Signed)
  Progress Note   Date: 07/30/2016  Patient Name: [name]        MRN#: [id number]   Clarification of the diagnosis of obesity:   morbid obesity    Irwin Brakeman, MD

## 2016-07-30 NOTE — Progress Notes (Signed)
Patient ID: [name], female   DOB: 25-Dec-1960, 56 y.o.   MRN: [id number]  [facility] Surgery Progress Note     Subjective: CC- dysuria Sitting up in chair. States that she feels very tired, like she did yesterday when her potassium was low. She has tolerated regular diet since procedure yesterday. Denies n/v. Dysuria improving. No BM since admission.  Objective: Vital signs in last 24 hours: Temp:  [98 F (36.7 C)-98.3 F (36.8 C)] 98 F (36.7 C) (06/20 0505) Pulse Rate:  [84-99] 94 (06/20 0505) Resp:  [15-23] 19 (06/20 0505) BP: (115-160)/(72-108) 138/89 (06/20 0505) SpO2:  [96 %-100 %] 100 % (06/20 0505) Last BM Date: 07/28/16  Intake/Output from previous day: 06/19 0701 - 06/20 0700 In: 1641.3 [P.O.:250; I.V.:1191.3; IV Piggyback:200] Out: 1000 [Urine:850; Drains:150] Intake/Output this shift: No intake/output data recorded.  PE: Gen:  Alert, NAD, pleasant HEENT: EOM's intact, pupils equal  Card:  RRR, no M/G/R heard Pulm:  CTAB, no W/R/R, effort normal Abd: obese, soft, NT/ND, +BS, no HSM, no hernia, drain with cloudy/sanguinous fluid in bulb Ext:  No erythema, edema, or tenderness BUE/BLE  Psych: A&Ox3   Lab Results:   Recent Labs  07/29/16 0505 07/30/16 0642  WBC 16.6* 11.6*  HGB 11.8* 11.5*  HCT 37.4 36.7  PLT 317 315   BMET  Recent Labs  07/29/16 0505 07/29/16 1125 07/30/16 0642  NA 137  --  137  K 2.5* 3.0* 2.9*  CL 97*  --  102  CO2 29  --  30  GLUCOSE 102*  --  155*  BUN 9  --  10  CREATININE 0.87  --  1.06*  CALCIUM 8.4*  --  8.4*   PT/INR  Recent Labs  07/28/16 1400  LABPROT 14.4  INR 1.11   CMP     Component Value Date/Time   NA 137 07/30/2016 0642   K 2.9 (L) 07/30/2016 0642   CL 102 07/30/2016 0642   CO2 30 07/30/2016 0642   GLUCOSE 155 (H) 07/30/2016 0642   BUN 10 07/30/2016 0642   CREATININE 1.06 (H) 07/30/2016 0642   CALCIUM 8.4 (L) 07/30/2016 0642   PROT 6.1 (L) 07/30/2016 0642   ALBUMIN 2.6 (L)  07/30/2016 0642   AST 25 07/30/2016 0642   ALT 18 07/30/2016 0642   ALKPHOS 73 07/30/2016 0642   BILITOT 0.1 (L) 07/30/2016 0642   GFRNONAA 58 (L) 07/30/2016 0642   GFRAA >60 07/30/2016 0642   Lipase     Component Value Date/Time   LIPASE 20 07/28/2016 0744       Studies/Results: Ct Abdomen Pelvis W Contrast  Result Date: 07/28/2016 CLINICAL DATA:  Back pain, dysuria, leukocytosis EXAM: CT ABDOMEN AND PELVIS WITH CONTRAST TECHNIQUE: Multidetector CT imaging of the abdomen and pelvis was performed using the standard protocol following bolus administration of intravenous contrast. CONTRAST:  137mL ISOVUE-300 IOPAMIDOL (ISOVUE-300) INJECTION 61% COMPARISON:  01/09/2011 FINDINGS: Lower chest: Lung bases are clear. Hepatobiliary: 3.5 cm hemangioma along the right hepatic dome (series 3/ image 13), incompletely visualized. Additional smaller lesions are poorly evaluated. Gallbladder is unremarkable. No intrahepatic or extrahepatic ductal dilatation. Pancreas: Within normal limits. Spleen: Within normal limits. Adrenals/Urinary Tract: Adrenal glands are within normal limits. Right kidney is within normal limits. Two nonobstructing left lower pole renal calculi measuring up to 5 mm (coronal image 68). No ureteral or bladder calculi. No hydronephrosis. Bladder is notable for a 9.8 x 5.4 x 4.5 cm intramural abscess along the left superior bladder  dome (series 3/ image 68; coronal image 65). Stomach/Bowel: Stomach is within normal limits. No evidence of bowel obstruction. Normal appendix (series 3/ image 60). Suspected fistula between the sigmoid colon and the intramural bladder abscess (coronal image 64). Vascular/Lymphatic: No evidence of abdominal aortic aneurysm. Atherosclerotic calcifications of the abdominal aorta and branch vessels. Small retroperitoneal lymph nodes which do not meet pathologic CT size criteria. Reproductive: Status post hysterectomy. Right ovary is within normal limits. Intramural  bladder abscess is difficult to separate from the left ovary (series 3/image 64). Other: No abdominopelvic ascites. No free air. Musculoskeletal: Degenerative changes of the visualized thoracolumbar spine. IMPRESSION: 9.8 x 5.4 x 4.5 cm intramural abscess along the left superior bladder dome. Associated fistulous communication with the sigmoid colon. Possible involvement of the left ovary. No ascites or free air. Electronically Signed   By: Julian Hy M.D.   On: 07/28/2016 09:06   Ct Image Guided Drainage By Percutaneous Catheter  Result Date: 07/29/2016 INDICATION: Pelvic abscess at the dome of the bladder EXAM: CT GUIDED DRAINAGE OF PELVIC ABSCESS MEDICATIONS: The patient is currently admitted to the hospital and receiving intravenous antibiotics. The antibiotics were administered within an appropriate time frame prior to the initiation of the procedure. ANESTHESIA/SEDATION: Four mg IV Versed 200 mcg IV Fentanyl Moderate Sedation Time:  42 The patient was continuously monitored during the procedure by the interventional radiology nurse under my direct supervision. COMPLICATIONS: None immediate. TECHNIQUE: Informed written consent was obtained from the patient after a thorough discussion of the procedural risks, benefits and alternatives. All questions were addressed. Maximal Sterile Barrier Technique was utilized including caps, mask, sterile gowns, sterile gloves, sterile drape, hand hygiene and skin antiseptic. A timeout was performed prior to the initiation of the procedure. PROCEDURE: The lower abdomen was prepped with ChloraPrep in a sterile fashion, and a sterile drape was applied covering the operative field. A sterile gown and sterile gloves were used for the procedure. Local anesthesia was provided with 1% Lidocaine. Under CT guidance, an 18 gauge needle was advanced into the pelvic abscess at the dome of the bladder. Cloudy fluid was aspirated. An Amplatz wire was coiled in the fluid  collection. 50 cc iodinated contrast was injected with delayed imaging of the bladder. This was performed to insure that the wire was coiled in the abscess and outside of the bladder. A 10 French dilator followed by a 10 [place] drain was inserted and coiled in the abscess cavity. 30 cc brown pus was aspirated. It was string fixed and sewn to the skin. FINDINGS: Imaging demonstrates needle placement and wire coiled within the a abscess at the dome of the bladder. The CT cystogram demonstrates contrast filling the bladder adjacent to the abscess cavity. The wire is coiled in the abscess cavity Final imaging demonstrates 10 French drain placement in the pelvic abscess. IMPRESSION: Successful 10 French pelvic abscess drainage yielding pus. Electronically Signed   By: Marybelle Killings M.D.   On: 07/29/2016 16:27    Anti-infectives: Anti-infectives    Start     Dose/Rate Route Frequency Ordered Stop   07/28/16 2200  piperacillin-tazobactam (ZOSYN) IVPB 3.375 g  Status:  Discontinued     3.375 g 100 mL/hr over 30 Minutes Intravenous Every 8 hours 07/28/16 2001 07/28/16 2102   07/28/16 2200  piperacillin-tazobactam (ZOSYN) IVPB 3.375 g     3.375 g 12.5 mL/hr over 240 Minutes Intravenous Every 8 hours 07/28/16 2102     07/28/16 1015  piperacillin-tazobactam (ZOSYN) IVPB 3.375 g  3.375 g 12.5 mL/hr over 240 Minutes Intravenous  Once 07/28/16 1005 07/28/16 1453       Assessment/Plan HTN Asthma Obesity HLD Hypokalemia  Bladder wall abscess with fistula to sigmoid colon - colon does not look inflamed on CT, possible fistula between abscess and colon, colonoscopy on 07/25/16 did not mention any active diverticulitis - s/p perc drainage 6/19, culture pending - urology recommending outpatient cysto in a few months, unless she develops fecaluria or pneumaturia and intervention is needed sooner  ID - zosyn 6/18>> FEN - carb modified diet VTE - SCDs, lovenox   Plan - replace potassium. Add daily  miralax. Continue antibiotics and drain. Follow culture. Appreciate urology consult.   LOS: 2 days    Jerrye Beavers , [facility] Surgery 07/30/2016, 8:19 AM Pager: [phone] Consults: [phone] Mon-Fri 7:00 am-4:30 pm Sat-Sun 7:00 am-11:30 am

## 2016-07-30 NOTE — Progress Notes (Signed)
Patient ID: [name], female   DOB: 03-06-1960, 56 y.o.   MRN: [id number]                                                                PROGRESS NOTE                                                                                                                                                                                                             Patient Demographics:    [name], is a 56 y.o. female, DOB - 04/03/1960, JKD:[id number]  Admit date - 07/28/2016   Admitting Physician Waldemar Dickens, MD  Outpatient Primary MD for the patient is Biagio Borg, MD  LOS - 2  Outpatient Specialists:     Chief Complaint  Patient presents with  . Back Pain  . Dysuria       Brief Narrative  56 y.o. female with medical history significant for obesity, asthma, hypertension, dyslipidemia, history of diverticulitis with known pancolonic diverticula based on recent colonoscopy on 07/25/16, and GERD who presented with continued dysuria, urinary frequency with urgency as well as subjective fevers and suprapubic and low back pain despite treatment with antibiotics and Pyridium. Patient was initially seen in the ER on 6/4 and was prescribed Keflex. She returned to the ER on 6/7 due to non-improvement in symptoms and was given Pyridium. Initial urine culture and repeat culture on 6/15 with multiple species present. Today patient had leukocytosis with white count of 16,900. CT abdomen and pelvis today revealed a 9.8 x 5.4 x 4.5 internal abscess on the left superior bladder dome with the appearance of an associated fistulous communication with the sigmoid colon as well as possible involvement of the left ovary. Patient has been evaluated by general surgery and urology while in the ER and at this time no definitive surgical treatment has been deemed necessary. We have been asked to admit for medical management of bladder wall abscess of indeterminate etiology. In retrospect, patient reports that  beginning in mid May she noticed painful urination and by the time she presented in June she began having back pain and suprapubic pain.  ED Course:  Vital Signs: BP 133/81   Pulse 98   Temp 98.1 F (36.7 C) (Oral)   Resp 18  Ht 5\' 1"  (1.549 m)   Wt 133.8 kg (295 lb)   SpO2 98%   BMI 55.74 kg/m  2 view CXR: Pulmonary vascular prominence and venous hypertensive changes without overt pulmonary edema or pleural effusions CT abdomen and pelvis with contrast: As above Lab data: Sodium 139, potassium 3.0, chloride 101, CO2 27, glucose 98, BUN 11, creatinine 0.82, anion gap 11, LFTs normal, albumin 3.1, white count 16,900 differential not obtained, urinalysis abnormal with hazy appearance, rare bacteria, amber color, 100 protein Medications and treatments: Normal saline bolus 1 L, Toradol 15 mg IV 1, Zosyn 3.375 g IV 1, Vicodin 5-3 252 tablets    Subjective:    [name] is reporting that she is feeling very weak today.    Assessment  & Plan :    Principal Problem:   Abscess of bladder Active Problems:   Hypertension   Asthma   Alcohol abuse   Obesity (BMI 30-39.9)   Colonic diverticulum   Acute hypokalemia   HLD (hyperlipidemia)   Abscess of bladder/Colonic diverticulum -s/p percutaneous drainage, awaiting culture results - continue antibiotics  Hypokalemia Ordered for replacement in oral and IVFs    Hypertension -Continue preadmission losartan -Holding thiazide diuretic    Alcohol abuse -Patient reports social drinking only without daily ingestion -Last drank this past Saturday at a friend's birthday party    Acute hypokalemia -Likely secondary to thiazide diuretic as well as poor oral intake prior to admission -Oral repletion -Follow labs    Asthma -Continue preadmission MDIs -Currently stable without active wheezing or reports of shortness of breath    Obesity (BMI 30-39.9) -Defer weight reduction strategies to PCP    HLD  (hyperlipidemia) -Hold preadmission statin until diet advanced   DVT prophylaxis: Lovenox 1 dose now then resume dosing at 6 PM tomorrow post IR procedure  Code Status: Full  Family Communication: w patient Disposition Plan: Home Consults called: Gen. Surgery/Tsuei; Urology/Wrenn; IR/[facility]oss     Lab Results  Component Value Date   PLT 315 07/30/2016    Antibiotics  :  Zosyn 6/18=>  Anti-infectives    Start     Dose/Rate Route Frequency Ordered Stop   07/28/16 2200  piperacillin-tazobactam (ZOSYN) IVPB 3.375 g  Status:  Discontinued     3.375 g 100 mL/hr over 30 Minutes Intravenous Every 8 hours 07/28/16 2001 07/28/16 2102   07/28/16 2200  piperacillin-tazobactam (ZOSYN) IVPB 3.375 g     3.375 g 12.5 mL/hr over 240 Minutes Intravenous Every 8 hours 07/28/16 2102     07/28/16 1015  piperacillin-tazobactam (ZOSYN) IVPB 3.375 g     3.375 g 12.5 mL/hr over 240 Minutes Intravenous  Once 07/28/16 1005 07/28/16 1453        Objective:   Vitals:   07/29/16 1609 07/29/16 2113 07/30/16 0043 07/30/16 0505  BP: 127/79 115/72 125/77 138/89  Pulse: 96 97 84 94  Resp: 15 19 18 19   Temp:  98.2 F (36.8 C) 98.3 F (36.8 C) 98 F (36.7 C)  TempSrc:  Oral Oral Oral  SpO2: 100% 96% 99% 100%  Weight:      Height:        Wt Readings from Last 3 Encounters:  07/28/16 133.8 kg (295 lb)  07/25/16 132.9 kg (293 lb)  07/10/16 135.6 kg (299 lb)     Intake/Output Summary (Last 24 hours) at 07/30/16 1259 Last data filed at 07/30/16 0905  Gross per 24 hour  Intake  2001.25 ml  Output              560 ml  Net          1441.25 ml     Physical Exam  NCAT, Awake Alert, Oriented X 3, Normal affect Supple Neck,No JVD, No cervical lymphadenopathy.  Symmetrical Chest wall movement, Good air movement bilaterally, CTAB RRR,No Gallops,Rubs or new Murmurs, No Parasternal Heave Normal BS Abd Soft, No tenderness, No organomegaly appriciated, No rebound - guarding or rigidity. No  Cyanosis, Clubbing or edema, No new Rash or bruise   Drain noted    Data Review:    CBC  Recent Labs Lab 07/28/16 0600 07/29/16 0505 07/30/16 0642  WBC 16.9* 16.6* 11.6*  HGB 13.4 11.8* 11.5*  HCT 41.6 37.4 36.7  PLT 339 317 315  MCV 90.4 90.6 91.5  MCH 29.1 28.6 28.7  MCHC 32.2 31.6 31.3  RDW 13.6 13.4 13.4    Chemistries   Recent Labs Lab 07/28/16 0600 07/28/16 0744 07/28/16 2125 07/29/16 0505 07/29/16 1125 07/30/16 0642  NA 139  --   --  137  --  137  K 3.0*  --   --  2.5* 3.0* 2.9*  CL 101  --   --  97*  --  102  CO2 27  --   --  29  --  30  GLUCOSE 98  --   --  102*  --  155*  BUN 11  --   --  9  --  10  CREATININE 0.82  --   --  0.87  --  1.06*  CALCIUM 9.0  --   --  8.4*  --  8.4*  MG  --   --  2.1  --   --   --   AST  --  29  --  24  --  25  ALT  --  21  --  18  --  18  ALKPHOS  --  89  --  85  --  73  BILITOT  --  0.5  --  0.5  --  0.1*   ------------------------------------------------------------------------------------------------------------------ No results for input(s): CHOL, HDL, LDLCALC, TRIG, CHOLHDL, LDLDIRECT in the last 72 hours.  Lab Results  Component Value Date   HGBA1C 5.9 04/17/2016   ------------------------------------------------------------------------------------------------------------------ No results for input(s): TSH, T4TOTAL, T3FREE, THYROIDAB in the last 72 hours.  Invalid input(s): FREET3 ------------------------------------------------------------------------------------------------------------------ No results for input(s): VITAMINB12, FOLATE, FERRITIN, TIBC, IRON, RETICCTPCT in the last 72 hours.  Coagulation profile  Recent Labs Lab 07/28/16 1400  INR 1.11    No results for input(s): DDIMER in the last 72 hours.  Cardiac Enzymes No results for input(s): CKMB, TROPONINI, MYOGLOBIN in the last 168 hours.  Invalid input(s):  CK ------------------------------------------------------------------------------------------------------------------    Component Value Date/Time   BNP 27.0 04/18/2016 1201    Inpatient Medications  Scheduled Meds: . enoxaparin (LOVENOX) injection  40 mg Subcutaneous Q24H  . losartan  100 mg Oral Daily  . mometasone-formoterol  2 puff Inhalation BID  . polyethylene glycol  17 g Oral Daily  . potassium chloride  20 mEq Oral BID   Continuous Infusions: . 0.9 % NaCl with KCl 40 mEq / L    . piperacillin-tazobactam (ZOSYN)  IV Stopped (07/30/16 0839)   PRN Meds:.albuterol, bisacodyl, ondansetron (ZOFRAN) IV, oxyCODONE  Micro Results Recent Results (from the past 240 hour(s))  Urine Culture     Status: None   Collection Time: 07/25/16  9:05  AM  Result Value Ref Range Status   Organism ID, Bacteria   Final    Multiple organisms present,each less than 10,000 CFU/mL. These organisms,commonly found on external and internal genitalia,are considered colonizers. No further testing performed.   Urine culture     Status: Abnormal   Collection Time: 07/28/16  5:39 AM  Result Value Ref Range Status   Specimen Description URINE, CLEAN CATCH  Final   Special Requests NONE  Final   Culture MULTIPLE SPECIES PRESENT, SUGGEST RECOLLECTION (A)  Final   Report Status 07/29/2016 FINAL  Final  Aerobic/Anaerobic Culture (surgical/deep wound)     Status: None (Preliminary result)   Collection Time: 07/29/16  4:19 PM  Result Value Ref Range Status   Specimen Description ABSCESS LEFT PELVIS  Final   Special Requests NONE  Final   Gram Stain   Final    ABUNDANT WBC PRESENT, PREDOMINANTLY PMN ABUNDANT GRAM NEGATIVE RODS ABUNDANT GRAM POSITIVE COCCI IN PAIRS FEW GRAM POSITIVE RODS    Culture PENDING  Incomplete   Report Status PENDING  Incomplete    Radiology Reports Ct Abdomen Pelvis W Contrast  Result Date: 07/28/2016 CLINICAL DATA:  Back pain, dysuria, leukocytosis EXAM: CT ABDOMEN AND  PELVIS WITH CONTRAST TECHNIQUE: Multidetector CT imaging of the abdomen and pelvis was performed using the standard protocol following bolus administration of intravenous contrast. CONTRAST:  139mL ISOVUE-300 IOPAMIDOL (ISOVUE-300) INJECTION 61% COMPARISON:  01/09/2011 FINDINGS: Lower chest: Lung bases are clear. Hepatobiliary: 3.5 cm hemangioma along the right hepatic dome (series 3/ image 13), incompletely visualized. Additional smaller lesions are poorly evaluated. Gallbladder is unremarkable. No intrahepatic or extrahepatic ductal dilatation. Pancreas: Within normal limits. Spleen: Within normal limits. Adrenals/Urinary Tract: Adrenal glands are within normal limits. Right kidney is within normal limits. Two nonobstructing left lower pole renal calculi measuring up to 5 mm (coronal image 68). No ureteral or bladder calculi. No hydronephrosis. Bladder is notable for a 9.8 x 5.4 x 4.5 cm intramural abscess along the left superior bladder dome (series 3/ image 68; coronal image 65). Stomach/Bowel: Stomach is within normal limits. No evidence of bowel obstruction. Normal appendix (series 3/ image 60). Suspected fistula between the sigmoid colon and the intramural bladder abscess (coronal image 64). Vascular/Lymphatic: No evidence of abdominal aortic aneurysm. Atherosclerotic calcifications of the abdominal aorta and branch vessels. Small retroperitoneal lymph nodes which do not meet pathologic CT size criteria. Reproductive: Status post hysterectomy. Right ovary is within normal limits. Intramural bladder abscess is difficult to separate from the left ovary (series 3/image 64). Other: No abdominopelvic ascites. No free air. Musculoskeletal: Degenerative changes of the visualized thoracolumbar spine. IMPRESSION: 9.8 x 5.4 x 4.5 cm intramural abscess along the left superior bladder dome. Associated fistulous communication with the sigmoid colon. Possible involvement of the left ovary. No ascites or free air.  Electronically Signed   By: Julian Hy M.D.   On: 07/28/2016 09:06   Ct Image Guided Drainage By Percutaneous Catheter  Result Date: 07/29/2016 INDICATION: Pelvic abscess at the dome of the bladder EXAM: CT GUIDED DRAINAGE OF PELVIC ABSCESS MEDICATIONS: The patient is currently admitted to the hospital and receiving intravenous antibiotics. The antibiotics were administered within an appropriate time frame prior to the initiation of the procedure. ANESTHESIA/SEDATION: Four mg IV Versed 200 mcg IV Fentanyl Moderate Sedation Time:  42 The patient was continuously monitored during the procedure by the interventional radiology nurse under my direct supervision. COMPLICATIONS: None immediate. TECHNIQUE: Informed written consent was obtained from the patient after  a thorough discussion of the procedural risks, benefits and alternatives. All questions were addressed. Maximal Sterile Barrier Technique was utilized including caps, mask, sterile gowns, sterile gloves, sterile drape, hand hygiene and skin antiseptic. A timeout was performed prior to the initiation of the procedure. PROCEDURE: The lower abdomen was prepped with ChloraPrep in a sterile fashion, and a sterile drape was applied covering the operative field. A sterile gown and sterile gloves were used for the procedure. Local anesthesia was provided with 1% Lidocaine. Under CT guidance, an 18 gauge needle was advanced into the pelvic abscess at the dome of the bladder. Cloudy fluid was aspirated. An Amplatz wire was coiled in the fluid collection. 50 cc iodinated contrast was injected with delayed imaging of the bladder. This was performed to insure that the wire was coiled in the abscess and outside of the bladder. A 10 French dilator followed by a 10 [place] drain was inserted and coiled in the abscess cavity. 30 cc brown pus was aspirated. It was string fixed and sewn to the skin. FINDINGS: Imaging demonstrates needle placement and wire coiled within  the a abscess at the dome of the bladder. The CT cystogram demonstrates contrast filling the bladder adjacent to the abscess cavity. The wire is coiled in the abscess cavity Final imaging demonstrates 10 French drain placement in the pelvic abscess. IMPRESSION: Successful 10 French pelvic abscess drainage yielding pus. Electronically Signed   By: Marybelle Killings M.D.   On: 07/29/2016 16:27    Time Spent in minutes  30   Jowan Skillin M.D on 07/30/2016 at 12:59 PM  Between 7am to 7pm - Pager - [phone]  After 7pm go to [email or web address] - password [facility]  [facility] -  Office  [phone]

## 2016-07-30 NOTE — Progress Notes (Signed)
Patient ID: [name], female   DOB: 17-May-1960, 56 y.o.   MRN: [id number]    Referring Physician(s): Dr. Carmin Muskrat  Supervising Physician: Corrie Mckusick  Patient Status: [facility] - In-pt  Chief Complaint: Bladder wall abscess  Subjective: Patient is feeling better today except when she sits up.  She still has some dysuria.  Allergies: Morphine; Shrimp [shellfish allergy]; Tramadol; and Latex  Medications: Prior to Admission medications   Medication Sig Start Date End Date Taking? Authorizing Provider  albuterol (PROVENTIL HFA;VENTOLIN HFA) 108 (90 Base) MCG/ACT inhaler Inhale 1-2 puffs into the lungs every 6 (six) hours as needed for wheezing. 04/18/16  Yes Larene Pickett, PA-C  atorvastatin (LIPITOR) 10 MG tablet Take 1 tablet (10 mg total) by mouth daily. 12/07/15  Yes Biagio Borg, MD  Fluticasone-Salmeterol (ADVAIR DISKUS) 250-50 MCG/DOSE AEPB Inhale 1 puff into the lungs 2 (two) times daily. 04/07/16  Yes Biagio Borg, MD  HYDROcodone-acetaminophen (NORCO/VICODIN) 5-325 MG tablet Take 1 tablet by mouth 4 (four) times daily as needed. Patient taking differently: Take 1 tablet by mouth 4 (four) times daily as needed for moderate pain or severe pain.  07/25/16  Yes Biagio Borg, MD  ibuprofen (ADVIL,MOTRIN) 600 MG tablet Take 300 mg by mouth 2 (two) times daily as needed for moderate pain.   Yes [provider]  losartan-hydrochlorothiazide (HYZAAR) 100-25 MG tablet Take 1 tablet by mouth every morning. 12/07/15  Yes Biagio Borg, MD  Polyvinyl Alcohol-Povidone (CLEAR EYES ALL SEASONS) 5-6 MG/ML SOLN Place 2 drops into both eyes daily.   Yes [provider]  conjugated estrogens (PREMARIN) vaginal cream Place 1 Applicatorful vaginally daily. Patient not taking: Reported on 07/28/2016 07/25/16   Biagio Borg, MD  phenazopyridine (PYRIDIUM) 100 MG tablet Take 1 tablet (100 mg total) by mouth 3 (three) times daily as needed for pain. Patient not taking:  Reported on 07/28/2016 07/25/16   Biagio Borg, MD    Vital Signs: BP 138/89 (BP Location: Left Arm)   Pulse 94   Temp 98 F (36.7 C) (Oral)   Resp 19   Ht 5\' 1"  (1.549 m)   Wt 295 lb (133.8 kg)   SpO2 100%   BMI 55.74 kg/m   Physical Exam: Abd: soft, drain site is c/d/i.  Drain with serosang output.  Imaging: Ct Abdomen Pelvis W Contrast  Result Date: 07/28/2016 CLINICAL DATA:  Back pain, dysuria, leukocytosis EXAM: CT ABDOMEN AND PELVIS WITH CONTRAST TECHNIQUE: Multidetector CT imaging of the abdomen and pelvis was performed using the standard protocol following bolus administration of intravenous contrast. CONTRAST:  14mL ISOVUE-300 IOPAMIDOL (ISOVUE-300) INJECTION 61% COMPARISON:  01/09/2011 FINDINGS: Lower chest: Lung bases are clear. Hepatobiliary: 3.5 cm hemangioma along the right hepatic dome (series 3/ image 13), incompletely visualized. Additional smaller lesions are poorly evaluated. Gallbladder is unremarkable. No intrahepatic or extrahepatic ductal dilatation. Pancreas: Within normal limits. Spleen: Within normal limits. Adrenals/Urinary Tract: Adrenal glands are within normal limits. Right kidney is within normal limits. Two nonobstructing left lower pole renal calculi measuring up to 5 mm (coronal image 68). No ureteral or bladder calculi. No hydronephrosis. Bladder is notable for a 9.8 x 5.4 x 4.5 cm intramural abscess along the left superior bladder dome (series 3/ image 68; coronal image 65). Stomach/Bowel: Stomach is within normal limits. No evidence of bowel obstruction. Normal appendix (series 3/ image 60). Suspected fistula between the sigmoid colon and the intramural bladder abscess (coronal image 64). Vascular/Lymphatic: No evidence of  abdominal aortic aneurysm. Atherosclerotic calcifications of the abdominal aorta and branch vessels. Small retroperitoneal lymph nodes which do not meet pathologic CT size criteria. Reproductive: Status post hysterectomy. Right ovary is  within normal limits. Intramural bladder abscess is difficult to separate from the left ovary (series 3/image 64). Other: No abdominopelvic ascites. No free air. Musculoskeletal: Degenerative changes of the visualized thoracolumbar spine. IMPRESSION: 9.8 x 5.4 x 4.5 cm intramural abscess along the left superior bladder dome. Associated fistulous communication with the sigmoid colon. Possible involvement of the left ovary. No ascites or free air. Electronically Signed   By: Julian Hy M.D.   On: 07/28/2016 09:06   Ct Image Guided Drainage By Percutaneous Catheter  Result Date: 07/29/2016 INDICATION: Pelvic abscess at the dome of the bladder EXAM: CT GUIDED DRAINAGE OF PELVIC ABSCESS MEDICATIONS: The patient is currently admitted to the hospital and receiving intravenous antibiotics. The antibiotics were administered within an appropriate time frame prior to the initiation of the procedure. ANESTHESIA/SEDATION: Four mg IV Versed 200 mcg IV Fentanyl Moderate Sedation Time:  42 The patient was continuously monitored during the procedure by the interventional radiology nurse under my direct supervision. COMPLICATIONS: None immediate. TECHNIQUE: Informed written consent was obtained from the patient after a thorough discussion of the procedural risks, benefits and alternatives. All questions were addressed. Maximal Sterile Barrier Technique was utilized including caps, mask, sterile gowns, sterile gloves, sterile drape, hand hygiene and skin antiseptic. A timeout was performed prior to the initiation of the procedure. PROCEDURE: The lower abdomen was prepped with ChloraPrep in a sterile fashion, and a sterile drape was applied covering the operative field. A sterile gown and sterile gloves were used for the procedure. Local anesthesia was provided with 1% Lidocaine. Under CT guidance, an 18 gauge needle was advanced into the pelvic abscess at the dome of the bladder. Cloudy fluid was aspirated. An Amplatz wire  was coiled in the fluid collection. 50 cc iodinated contrast was injected with delayed imaging of the bladder. This was performed to insure that the wire was coiled in the abscess and outside of the bladder. A 10 French dilator followed by a 10 [place] drain was inserted and coiled in the abscess cavity. 30 cc brown pus was aspirated. It was string fixed and sewn to the skin. FINDINGS: Imaging demonstrates needle placement and wire coiled within the a abscess at the dome of the bladder. The CT cystogram demonstrates contrast filling the bladder adjacent to the abscess cavity. The wire is coiled in the abscess cavity Final imaging demonstrates 10 French drain placement in the pelvic abscess. IMPRESSION: Successful 10 French pelvic abscess drainage yielding pus. Electronically Signed   By: Marybelle Killings M.D.   On: 07/29/2016 16:27    Labs:  CBC:  Recent Labs  04/18/16 1201 07/28/16 0600 07/29/16 0505 07/30/16 0642  WBC 9.3 16.9* 16.6* 11.6*  HGB 13.9 13.4 11.8* 11.5*  HCT 42.7 41.6 37.4 36.7  PLT 212 339 317 315    COAGS:  Recent Labs  07/28/16 1400  INR 1.11  APTT 29    BMP:  Recent Labs  04/18/16 1201 07/28/16 0600 07/29/16 0505 07/29/16 1125 07/30/16 0642  NA 135 139 137  --  137  K 3.3* 3.0* 2.5* 3.0* 2.9*  CL 98* 101 97*  --  102  CO2 25 27 29   --  30  GLUCOSE 102* 98 102*  --  155*  BUN 13 11 9   --  10  CALCIUM 9.2 9.0 8.4*  --  8.4*  CREATININE 0.96 0.82 0.87  --  1.06*  GFRNONAA >60 >60 >60  --  58*  GFRAA >60 >60 >60  --  >60    LIVER FUNCTION TESTS:  Recent Labs  07/10/16 0936 07/28/16 0744 07/29/16 0505 07/30/16 0642  BILITOT 0.4 0.5 0.5 0.1*  AST 32 29 24 25   ALT 29 21 18 18   ALKPHOS 91 89 85 73  PROT 7.5 6.9 6.5 6.1*  ALBUMIN 3.9 3.1* 2.7* 2.6*    Assessment and Plan: 1. Bladder wall abscess, s/p perc drain  Cont drain placement CX are still preliminary Will follow Irrigate drain as ordered.  Electronically Signed: Henreitta Cea 07/30/2016, 11:04 AM   I spent a total of 15 Minutes at the the patient's bedside AND on the patient's hospital floor or unit, greater than 50% of which was counseling/coordinating care for bladder wall abscess

## 2016-07-30 NOTE — Evaluation (Signed)
Physical Therapy Evaluation and Discharge Summary Patient Details Name: [name] MRN: [id number] DOB: 1960-11-07 Today's Date: 07/30/2016   History of Present Illness  Pt is a 56 y/o female admitted secondary to dysuria, suprapubic and back pain. Pt found to have a bladder abscess, s/p perc drainage. PMH including but not limited to obesity, HLD, HTN, R TKA in 2013 and L TKA in 2014.  Clinical Impression  Pt presented supine in bed with HOB elevated, awake and willing to participate in therapy session. Prior to admission, pt reported that she was independent with all functional mobility and ADLs. Pt stated that she drives and continues to work full-time. Pt ambulated in hallway with supervision, pushing the IV pole. No instability or LOB with any functional mobility. No further acute PT needs identified at this time. PT signing off.    Follow Up Recommendations No PT follow up    Equipment Recommendations  None recommended by PT    Recommendations for Other Services       Precautions / Restrictions Precautions Precautions: None Precaution Comments: abdominal bulb drain Restrictions Weight Bearing Restrictions: No      Mobility  Bed Mobility Overal bed mobility: Independent                Transfers Overall transfer level: Modified independent Equipment used: None                Ambulation/Gait Ambulation/Gait assistance: Supervision Ambulation Distance (Feet): 500 Feet Assistive device: None (pt preferring to push IV pole) Gait Pattern/deviations: Step-through pattern;Wide base of support Gait velocity: WFL Gait velocity interpretation: Below normal speed for age/gender General Gait Details: no instability or LOB, supervision for safety  Stairs            Wheelchair Mobility    Modified Rankin (Stroke Patients Only)       Balance Overall balance assessment: No apparent balance deficits (not formally assessed)                                            Pertinent Vitals/Pain Pain Assessment: No/denies pain    Home Living Family/patient expects to be discharged to:: Private residence Living Arrangements: Spouse/significant other Available Help at Discharge: Family;Available 24 hours/day Type of Home: House Home Access: Stairs to enter Entrance Stairs-Rails: Psychiatric nurse of Steps: 5 Home Layout: One level Home Equipment: Shower seat;Cane - single point;Walker - 2 wheels      Prior Function Level of Independence: Independent         Comments: drives, works full-time     Journalist, newspaper   Dominant Hand: Right    Extremity/Trunk Assessment   Upper Extremity Assessment Upper Extremity Assessment: Overall WFL for tasks assessed    Lower Extremity Assessment Lower Extremity Assessment: Overall WFL for tasks assessed    Cervical / Trunk Assessment Cervical / Trunk Assessment: Normal  Communication   Communication: No difficulties  Cognition Arousal/Alertness: Awake/alert Behavior During Therapy: WFL for tasks assessed/performed Overall Cognitive Status: Within Functional Limits for tasks assessed                                        General Comments      Exercises     Assessment/Plan    PT Assessment Patent does not need  any further PT services  PT Problem List         PT Treatment Interventions      PT Goals (Current goals can be found in the Care Plan section)  Acute Rehab PT Goals Patient Stated Goal: return home    Frequency     Barriers to discharge        Co-evaluation               AM-PAC PT "6 Clicks" Daily Activity  Outcome Measure Difficulty turning over in bed (including adjusting bedclothes, sheets and blankets)?: None Difficulty moving from lying on back to sitting on the side of the bed? : None Difficulty sitting down on and standing up from a chair with arms (e.g., wheelchair, bedside commode, etc,.)?:  None Help needed moving to and from a bed to chair (including a wheelchair)?: None Help needed walking in hospital room?: None Help needed climbing 3-5 steps with a railing? : A Little 6 Click Score: 23    End of Session   Activity Tolerance: Patient tolerated treatment well Patient left: in chair;with call bell/phone within reach Nurse Communication: Mobility status PT Visit Diagnosis: Other abnormalities of gait and mobility (R26.89)    Time: 8343-7357 PT Time Calculation (min) (ACUTE ONLY): 20 min   Charges:   PT Evaluation $PT Eval Moderate Complexity: 1 Procedure     PT G Codes:        Sherie Don, PT, DPT [place] 07/30/2016, 5:29 PM

## 2016-07-31 ENCOUNTER — Encounter: Payer: Self-pay | Admitting: Internal Medicine

## 2016-07-31 LAB — CBC
HCT: 39.3 % (ref 36.0–46.0)
Hemoglobin: 12.2 g/dL (ref 12.0–15.0)
MCH: 28.4 pg (ref 26.0–34.0)
MCHC: 31 g/dL (ref 30.0–36.0)
MCV: 91.4 fL (ref 78.0–100.0)
Platelets: 350 10*3/uL (ref 150–400)
RBC: 4.3 MIL/uL (ref 3.87–5.11)
RDW: 13.5 % (ref 11.5–15.5)
WBC: 11.6 10*3/uL — ABNORMAL HIGH (ref 4.0–10.5)

## 2016-07-31 LAB — BASIC METABOLIC PANEL
Anion gap: 9 (ref 5–15)
BUN: 10 mg/dL (ref 6–20)
CO2: 28 mmol/L (ref 22–32)
Calcium: 9.1 mg/dL (ref 8.9–10.3)
Chloride: 106 mmol/L (ref 101–111)
Creatinine, Ser: 0.86 mg/dL (ref 0.44–1.00)
GFR calc Af Amer: >60 mL/min (ref >60)
GFR calc non Af Amer: >60 mL/min (ref >60)
Glucose, Bld: 112 mg/dL — ABNORMAL HIGH (ref 65–99)
Potassium: 3.6 mmol/L (ref 3.5–5.1)
Sodium: 143 mmol/L (ref 135–145)

## 2016-07-31 LAB — MAGNESIUM: Magnesium: 2.2 mg/dL (ref 1.7–2.4)

## 2016-07-31 MED ORDER — SENNOSIDES-DOCUSATE SODIUM 8.6-50 MG PO TABS
2.0000 | ORAL_TABLET | Freq: Two times a day (BID) | ORAL | Status: DC
  Administered 2016-07-31 – 2016-08-01 (×4): 2 via ORAL
  Filled 2016-07-31 (×4): qty 2

## 2016-07-31 MED ORDER — MAGNESIUM CITRATE PO SOLN
1.0000 | Freq: Once | ORAL | Status: AC
  Administered 2016-07-31: 1 via ORAL
  Filled 2016-07-31: qty 296

## 2016-07-31 MED ORDER — ALUM & MAG HYDROXIDE-SIMETH 200-200-20 MG/5ML PO SUSP
30.0000 mL | ORAL | Status: DC | PRN
  Administered 2016-07-31: 30 mL via ORAL
  Filled 2016-07-31: qty 30

## 2016-07-31 NOTE — Progress Notes (Signed)
Patient ID: [name], female   DOB: 1960/10/28, 56 y.o.   MRN: [id number]                                                                PROGRESS NOTE                                                                                                                                                                                                             Patient Demographics:    [name], is a 56 y.o. female, DOB - [birth year], SNK:[id number]  Admit date - 07/28/2016   Admitting Physician Waldemar Dickens, MD  Outpatient Primary MD for the patient is Biagio Borg, MD  LOS - 3  Outpatient Specialists:     Chief Complaint  Patient presents with  . Back Pain  . Dysuria       Brief Narrative  56 y.o. female with medical history significant for obesity, asthma, hypertension, dyslipidemia, history of diverticulitis with known pancolonic diverticula based on recent colonoscopy on 07/25/16, and GERD who presented with continued dysuria, urinary frequency with urgency as well as subjective fevers and suprapubic and low back pain despite treatment with antibiotics and Pyridium. Patient was initially seen in the ER on 6/4 and was prescribed Keflex. She returned to the ER on 6/7 due to non-improvement in symptoms and was given Pyridium. Initial urine culture and repeat culture on 6/15 with multiple species present. Today patient had leukocytosis with white count of 16,900. CT abdomen and pelvis today revealed a 9.8 x 5.4 x 4.5 internal abscess on the left superior bladder dome with the appearance of an associated fistulous communication with the sigmoid colon as well as possible involvement of the left ovary. Patient has been evaluated by general surgery and urology while in the ER and at this time no definitive surgical treatment has been deemed necessary. We have been asked to admit for medical management of bladder wall abscess of indeterminate etiology. In retrospect, patient reports that  beginning in mid May she noticed painful urination and by the time she presented in June she began having back pain and suprapubic pain.  ED Course:  Vital Signs: BP 133/81   Pulse 98   Temp 98.1 F (36.7 C) (Oral)   Resp 18  Ht 5\' 1"  (1.549 m)   Wt 133.8 kg (295 lb)   SpO2 98%   BMI 55.74 kg/m  2 view CXR: Pulmonary vascular prominence and venous hypertensive changes without overt pulmonary edema or pleural effusions CT abdomen and pelvis with contrast: As above Lab data: Sodium 139, potassium 3.0, chloride 101, CO2 27, glucose 98, BUN 11, creatinine 0.82, anion gap 11, LFTs normal, albumin 3.1, white count 16,900 differential not obtained, urinalysis abnormal with hazy appearance, rare bacteria, amber color, 100 protein Medications and treatments: Normal saline bolus 1 L, Toradol 15 mg IV 1, Zosyn 3.375 g IV 1, Vicodin 5-3 252 tablets    Subjective:    Hilary Milks is reporting that she is having lots of flatus but no BM.    Assessment  & Plan :    Principal Problem:   Abscess of bladder Active Problems:   Hypertension   Asthma   Alcohol abuse   Obesity (BMI 30-39.9)   Colonic diverticulum   Acute hypokalemia   HLD (hyperlipidemia)  Abscess of bladder/Colonic diverticulum -s/p percutaneous drainage, awaiting culture results, preliminary gram stain reviewed, IDs pending - continue current antibiotics  Hypokalemia Continue oral replacement    Hypertension -Continue preadmission losartan -Holding thiazide diuretic    Alcohol abuse -Patient reports social drinking only without daily ingestion -Last drank this past Saturday at a friend's birthday party    Acute hypokalemia -Likely secondary to thiazide diuretic as well as poor oral intake prior to admission -Oral repletion -Follow labs    Asthma -Continue preadmission MDIs -Currently stable without active wheezing or reports of shortness of breath   Morbid Obesity -Encouraged ongoing exercise as  she has been doing    HLD (hyperlipidemia) -Hold preadmission statin until diet advanced   DVT prophylaxis: Pt refused lovenox but has been walking the halls  Code Status: Full  Family Communication: husband Disposition Plan: Home Consults called: Gen. Surgery/Tsuei; Urology/Wrenn; IR/[facility]oss  Lab Results  Component Value Date   PLT 350 07/31/2016   Antibiotics  :  Zosyn 6/18=>  Anti-infectives    Start     Dose/Rate Route Frequency Ordered Stop   07/28/16 2200  piperacillin-tazobactam (ZOSYN) IVPB 3.375 g  Status:  Discontinued     3.375 g 100 mL/hr over 30 Minutes Intravenous Every 8 hours 07/28/16 2001 07/28/16 2102   07/28/16 2200  piperacillin-tazobactam (ZOSYN) IVPB 3.375 g     3.375 g 12.5 mL/hr over 240 Minutes Intravenous Every 8 hours 07/28/16 2102     07/28/16 1015  piperacillin-tazobactam (ZOSYN) IVPB 3.375 g     3.375 g 12.5 mL/hr over 240 Minutes Intravenous  Once 07/28/16 1005 07/28/16 1453        Objective:   Vitals:   07/30/16 0505 07/30/16 1405 07/30/16 2108 07/31/16 0527  BP: 138/89 130/80 114/76 131/79  Pulse: 94 84 96 82  Resp: 19 18 18 18   Temp: 98 F (36.7 C) 97.4 F (36.3 C) 98.1 F (36.7 C) 97.7 F (36.5 C)  TempSrc: Oral Oral Oral Oral  SpO2: 100% 95% 98% 99%  Weight:      Height:        Wt Readings from Last 3 Encounters:  07/28/16 133.8 kg (295 lb)  07/25/16 132.9 kg (293 lb)  07/10/16 135.6 kg (299 lb)     Intake/Output Summary (Last 24 hours) at 07/31/16 1153 Last data filed at 07/31/16 0900  Gross per 24 hour  Intake  2268 ml  Output              550 ml  Net             1718 ml   Physical Exam  NCAT, Awake Alert, Oriented X 3, Normal affect Supple Neck,No JVD, No cervical lymphadenopathy.  Symmetrical Chest wall movement, Good air movement bilaterally, CTAB RRR,No Gallops,Rubs or new Murmurs, No Parasternal Heave Normal BS Abd Soft, No tenderness, No organomegaly appriciated, No rebound - guarding or  rigidity. No Cyanosis, Clubbing or edema, No new Rash or bruise   Drain noted with copious output    Data Review:    CBC  Recent Labs Lab 07/28/16 0600 07/29/16 0505 07/30/16 0642 07/31/16 0910  WBC 16.9* 16.6* 11.6* 11.6*  HGB 13.4 11.8* 11.5* 12.2  HCT 41.6 37.4 36.7 39.3  PLT 339 317 315 350  MCV 90.4 90.6 91.5 91.4  MCH 29.1 28.6 28.7 28.4  MCHC 32.2 31.6 31.3 31.0  RDW 13.6 13.4 13.4 13.5    Chemistries   Recent Labs Lab 07/28/16 0600 07/28/16 0744 07/28/16 2125 07/29/16 0505 07/29/16 1125 07/30/16 0642 07/31/16 0910  NA 139  --   --  137  --  137 143  K 3.0*  --   --  2.5* 3.0* 2.9* 3.6  CL 101  --   --  97*  --  102 106  CO2 27  --   --  29  --  30 28  GLUCOSE 98  --   --  102*  --  155* 112*  BUN 11  --   --  9  --  10 10  CREATININE 0.82  --   --  0.87  --  1.06* 0.86  CALCIUM 9.0  --   --  8.4*  --  8.4* 9.1  MG  --   --  2.1  --   --   --  2.2  AST  --  29  --  24  --  25  --   ALT  --  21  --  18  --  18  --   ALKPHOS  --  89  --  85  --  73  --   BILITOT  --  0.5  --  0.5  --  0.1*  --    ------------------------------------------------------------------------------------------------------------------ No results for input(s): CHOL, HDL, LDLCALC, TRIG, CHOLHDL, LDLDIRECT in the last 72 hours.  Lab Results  Component Value Date   HGBA1C 5.9 04/17/2016   ------------------------------------------------------------------------------------------------------------------ No results for input(s): TSH, T4TOTAL, T3FREE, THYROIDAB in the last 72 hours.  Invalid input(s): FREET3 ------------------------------------------------------------------------------------------------------------------ No results for input(s): VITAMINB12, FOLATE, FERRITIN, TIBC, IRON, RETICCTPCT in the last 72 hours.  Coagulation profile  Recent Labs Lab 07/28/16 1400  INR 1.11    No results for input(s): DDIMER in the last 72 hours.  Cardiac Enzymes No results for  input(s): CKMB, TROPONINI, MYOGLOBIN in the last 168 hours.  Invalid input(s): CK ------------------------------------------------------------------------------------------------------------------    Component Value Date/Time   BNP 27.0 04/18/2016 1201    Inpatient Medications  Scheduled Meds: . docusate sodium  100 mg Oral BID  . enoxaparin (LOVENOX) injection  40 mg Subcutaneous Q24H  . losartan  100 mg Oral Daily  . mometasone-formoterol  2 puff Inhalation BID  . polyethylene glycol  17 g Oral Daily  . potassium chloride  20 mEq Oral BID  . senna-docusate  2 tablet Oral BID   Continuous Infusions: . piperacillin-tazobactam (  ZOSYN)  IV Stopped (07/31/16 0927)   PRN Meds:.albuterol, bisacodyl, HYDROcodone-acetaminophen, ondansetron (ZOFRAN) IV  Micro Results Recent Results (from the past 240 hour(s))  Urine Culture     Status: None   Collection Time: 07/25/16  9:05 AM  Result Value Ref Range Status   Organism ID, Bacteria   Final    Multiple organisms present,each less than 10,000 CFU/mL. These organisms,commonly found on external and internal genitalia,are considered colonizers. No further testing performed.   Urine culture     Status: Abnormal   Collection Time: 07/28/16  5:39 AM  Result Value Ref Range Status   Specimen Description URINE, CLEAN CATCH  Final   Special Requests NONE  Final   Culture MULTIPLE SPECIES PRESENT, SUGGEST RECOLLECTION (A)  Final   Report Status 07/29/2016 FINAL  Final  Aerobic/Anaerobic Culture (surgical/deep wound)     Status: None (Preliminary result)   Collection Time: 07/29/16  4:19 PM  Result Value Ref Range Status   Specimen Description ABSCESS LEFT PELVIS  Final   Special Requests NONE  Final   Gram Stain   Final    ABUNDANT WBC PRESENT, PREDOMINANTLY PMN ABUNDANT GRAM NEGATIVE RODS ABUNDANT GRAM POSITIVE COCCI IN PAIRS FEW GRAM POSITIVE RODS    Culture ABUNDANT GRAM NEGATIVE RODS  Final   Report Status PENDING  Incomplete      Radiology Reports Ct Abdomen Pelvis W Contrast  Result Date: 07/28/2016 CLINICAL DATA:  Back pain, dysuria, leukocytosis EXAM: CT ABDOMEN AND PELVIS WITH CONTRAST TECHNIQUE: Multidetector CT imaging of the abdomen and pelvis was performed using the standard protocol following bolus administration of intravenous contrast. CONTRAST:  158mL ISOVUE-300 IOPAMIDOL (ISOVUE-300) INJECTION 61% COMPARISON:  01/09/2011 FINDINGS: Lower chest: Lung bases are clear. Hepatobiliary: 3.5 cm hemangioma along the right hepatic dome (series 3/ image 13), incompletely visualized. Additional smaller lesions are poorly evaluated. Gallbladder is unremarkable. No intrahepatic or extrahepatic ductal dilatation. Pancreas: Within normal limits. Spleen: Within normal limits. Adrenals/Urinary Tract: Adrenal glands are within normal limits. Right kidney is within normal limits. Two nonobstructing left lower pole renal calculi measuring up to 5 mm (coronal image 68). No ureteral or bladder calculi. No hydronephrosis. Bladder is notable for a 9.8 x 5.4 x 4.5 cm intramural abscess along the left superior bladder dome (series 3/ image 68; coronal image 65). Stomach/Bowel: Stomach is within normal limits. No evidence of bowel obstruction. Normal appendix (series 3/ image 60). Suspected fistula between the sigmoid colon and the intramural bladder abscess (coronal image 64). Vascular/Lymphatic: No evidence of abdominal aortic aneurysm. Atherosclerotic calcifications of the abdominal aorta and branch vessels. Small retroperitoneal lymph nodes which do not meet pathologic CT size criteria. Reproductive: Status post hysterectomy. Right ovary is within normal limits. Intramural bladder abscess is difficult to separate from the left ovary (series 3/image 64). Other: No abdominopelvic ascites. No free air. Musculoskeletal: Degenerative changes of the visualized thoracolumbar spine. IMPRESSION: 9.8 x 5.4 x 4.5 cm intramural abscess along the left  superior bladder dome. Associated fistulous communication with the sigmoid colon. Possible involvement of the left ovary. No ascites or free air. Electronically Signed   By: Julian Hy M.D.   On: 07/28/2016 09:06   Ct Image Guided Drainage By Percutaneous Catheter  Result Date: 07/29/2016 INDICATION: Pelvic abscess at the dome of the bladder EXAM: CT GUIDED DRAINAGE OF PELVIC ABSCESS MEDICATIONS: The patient is currently admitted to the hospital and receiving intravenous antibiotics. The antibiotics were administered within an appropriate time frame prior to the initiation of the  procedure. ANESTHESIA/SEDATION: Four mg IV Versed 200 mcg IV Fentanyl Moderate Sedation Time:  42 The patient was continuously monitored during the procedure by the interventional radiology nurse under my direct supervision. COMPLICATIONS: None immediate. TECHNIQUE: Informed written consent was obtained from the patient after a thorough discussion of the procedural risks, benefits and alternatives. All questions were addressed. Maximal Sterile Barrier Technique was utilized including caps, mask, sterile gowns, sterile gloves, sterile drape, hand hygiene and skin antiseptic. A timeout was performed prior to the initiation of the procedure. PROCEDURE: The lower abdomen was prepped with ChloraPrep in a sterile fashion, and a sterile drape was applied covering the operative field. A sterile gown and sterile gloves were used for the procedure. Local anesthesia was provided with 1% Lidocaine. Under CT guidance, an 18 gauge needle was advanced into the pelvic abscess at the dome of the bladder. Cloudy fluid was aspirated. An Amplatz wire was coiled in the fluid collection. 50 cc iodinated contrast was injected with delayed imaging of the bladder. This was performed to insure that the wire was coiled in the abscess and outside of the bladder. A 10 French dilator followed by a 10 [place] drain was inserted and coiled in the abscess cavity.  30 cc brown pus was aspirated. It was string fixed and sewn to the skin. FINDINGS: Imaging demonstrates needle placement and wire coiled within the a abscess at the dome of the bladder. The CT cystogram demonstrates contrast filling the bladder adjacent to the abscess cavity. The wire is coiled in the abscess cavity Final imaging demonstrates 10 French drain placement in the pelvic abscess. IMPRESSION: Successful 10 French pelvic abscess drainage yielding pus. Electronically Signed   By: Marybelle Killings M.D.   On: 07/29/2016 16:27   Time Spent in minutes  30  Shambhavi Salley M.D on 07/31/2016 at 11:53 AM  Between 7am to 7pm - Pager - [phone]  After 7pm go to [email or web address] - password [place]  [facility] -  Office  [phone]

## 2016-07-31 NOTE — Progress Notes (Signed)
Patient ID: [name], female   DOB: 02-08-1961, 56 y.o.   MRN: [id number]  [facility] Surgery Progress Note     Subjective: CC- colovesical fistula Sitting up in chair this morning. No complaints. States that she feels well, but she is concerned that she has not had a BM since last Friday. Tolerating diet. Denies n/v. Passing flatus. Dysuria improved.  Objective: Vital signs in last 24 hours: Temp:  [97.4 F (36.3 C)-98.1 F (36.7 C)] 97.7 F (36.5 C) (06/21 0527) Pulse Rate:  [82-96] 82 (06/21 0527) Resp:  [18] 18 (06/21 0527) BP: (114-131)/(76-80) 131/79 (06/21 0527) SpO2:  [95 %-99 %] 99 % (06/21 0527) Last BM Date: 07/28/16  Intake/Output from previous day: 06/20 0701 - 06/21 0700 In: 2388 [P.O.:1800; I.V.:533; IV Piggyback:50] Out: 560 [Urine:500; Drains:60] Intake/Output this shift: No intake/output data recorded.  PE: Gen: Alert, NAD, pleasant HEENT: EOM's intact, pupils equal  Card: RRR, no M/G/R heard Pulm: CTAB, no W/R/R, effort normal Abd: obese, soft, NT/ND, +BS, no HSM, no hernia, drain with cloudy/sanguinous fluid in bulb Ext: No erythema, edema, or tenderness BUE/BLE  Psych: A&Ox3  Lab Results:   Recent Labs  07/29/16 0505 07/30/16 0642  WBC 16.6* 11.6*  HGB 11.8* 11.5*  HCT 37.4 36.7  PLT 317 315   BMET  Recent Labs  07/29/16 0505 07/29/16 1125 07/30/16 0642  NA 137  --  137  K 2.5* 3.0* 2.9*  CL 97*  --  102  CO2 29  --  30  GLUCOSE 102*  --  155*  BUN 9  --  10  CREATININE 0.87  --  1.06*  CALCIUM 8.4*  --  8.4*   PT/INR  Recent Labs  07/28/16 1400  LABPROT 14.4  INR 1.11   CMP     Component Value Date/Time   NA 137 07/30/2016 0642   K 2.9 (L) 07/30/2016 0642   CL 102 07/30/2016 0642   CO2 30 07/30/2016 0642   GLUCOSE 155 (H) 07/30/2016 0642   BUN 10 07/30/2016 0642   CREATININE 1.06 (H) 07/30/2016 0642   CALCIUM 8.4 (L) 07/30/2016 0642   PROT 6.1 (L) 07/30/2016 0642   ALBUMIN 2.6 (L) 07/30/2016 0642    AST 25 07/30/2016 0642   ALT 18 07/30/2016 0642   ALKPHOS 73 07/30/2016 0642   BILITOT 0.1 (L) 07/30/2016 0642   GFRNONAA 58 (L) 07/30/2016 0642   GFRAA >60 07/30/2016 0642   Lipase     Component Value Date/Time   LIPASE 20 07/28/2016 0744       Studies/Results: Ct Image Guided Drainage By Percutaneous Catheter  Result Date: 07/29/2016 INDICATION: Pelvic abscess at the dome of the bladder EXAM: CT GUIDED DRAINAGE OF PELVIC ABSCESS MEDICATIONS: The patient is currently admitted to the hospital and receiving intravenous antibiotics. The antibiotics were administered within an appropriate time frame prior to the initiation of the procedure. ANESTHESIA/SEDATION: Four mg IV Versed 200 mcg IV Fentanyl Moderate Sedation Time:  42 The patient was continuously monitored during the procedure by the interventional radiology nurse under my direct supervision. COMPLICATIONS: None immediate. TECHNIQUE: Informed written consent was obtained from the patient after a thorough discussion of the procedural risks, benefits and alternatives. All questions were addressed. Maximal Sterile Barrier Technique was utilized including caps, mask, sterile gowns, sterile gloves, sterile drape, hand hygiene and skin antiseptic. A timeout was performed prior to the initiation of the procedure. PROCEDURE: The lower abdomen was prepped with ChloraPrep in a sterile fashion, and  a sterile drape was applied covering the operative field. A sterile gown and sterile gloves were used for the procedure. Local anesthesia was provided with 1% Lidocaine. Under CT guidance, an 18 gauge needle was advanced into the pelvic abscess at the dome of the bladder. Cloudy fluid was aspirated. An Amplatz wire was coiled in the fluid collection. 50 cc iodinated contrast was injected with delayed imaging of the bladder. This was performed to insure that the wire was coiled in the abscess and outside of the bladder. A 10 French dilator followed by a 10  [place] drain was inserted and coiled in the abscess cavity. 30 cc brown pus was aspirated. It was string fixed and sewn to the skin. FINDINGS: Imaging demonstrates needle placement and wire coiled within the a abscess at the dome of the bladder. The CT cystogram demonstrates contrast filling the bladder adjacent to the abscess cavity. The wire is coiled in the abscess cavity Final imaging demonstrates 10 French drain placement in the pelvic abscess. IMPRESSION: Successful 10 French pelvic abscess drainage yielding pus. Electronically Signed   By: Marybelle Killings M.D.   On: 07/29/2016 16:27    Anti-infectives: Anti-infectives    Start     Dose/Rate Route Frequency Ordered Stop   07/28/16 2200  piperacillin-tazobactam (ZOSYN) IVPB 3.375 g  Status:  Discontinued     3.375 g 100 mL/hr over 30 Minutes Intravenous Every 8 hours 07/28/16 2001 07/28/16 2102   07/28/16 2200  piperacillin-tazobactam (ZOSYN) IVPB 3.375 g     3.375 g 12.5 mL/hr over 240 Minutes Intravenous Every 8 hours 07/28/16 2102     07/28/16 1015  piperacillin-tazobactam (ZOSYN) IVPB 3.375 g     3.375 g 12.5 mL/hr over 240 Minutes Intravenous  Once 07/28/16 1005 07/28/16 1453       Assessment/Plan HTN Asthma Obesity HLD Hypokalemia AKI - wait for BMP results to decrease IVF  Bladder wall abscess with fistula to sigmoid colon - colon does not look inflamed on CT, possible fistula between abscess and colon, colonoscopy on 07/25/16 did not mention any active diverticulitis - s/p perc drainage 6/19, culture pending - urology recommending outpatient cysto in a few months, unless she develops fecaluria or pneumaturia and intervention is needed sooner  ID - zosyn 6/18>>day#4 FEN - carb modified diet VTE - SCDs, lovenox   Plan - Labs pending this AM. Continue perc drain and antibiotics. Follow culture, narrow antibiotics when able. Will arrange follow-up with Dr. Marcello Moores as outpatient to address colovesical fistula.    LOS: 3  days    Jerrye Beavers , [facility] Surgery 07/31/2016, 8:17 AM Pager: [phone] Consults: [phone] Mon-Fri 7:00 am-4:30 pm Sat-Sun 7:00 am-11:30 am

## 2016-07-31 NOTE — Progress Notes (Signed)
Referring Physician(s):  Dr. Carmin Muskrat  Supervising Physician: Jacqulynn Cadet  Patient Status:  [facility] - In-pt  Chief Complaint:  Bladder wall abscess  Subjective: Feeling much better, able to sit and walk with tolerable pain.  Allergies: Morphine; Shrimp [shellfish allergy]; Tramadol; and Latex  Medications: Prior to Admission medications   Medication Sig Start Date End Date Taking? Authorizing Provider  albuterol (PROVENTIL HFA;VENTOLIN HFA) 108 (90 Base) MCG/ACT inhaler Inhale 1-2 puffs into the lungs every 6 (six) hours as needed for wheezing. 04/18/16  Yes Larene Pickett, PA-C  atorvastatin (LIPITOR) 10 MG tablet Take 1 tablet (10 mg total) by mouth daily. 12/07/15  Yes Biagio Borg, MD  Fluticasone-Salmeterol (ADVAIR DISKUS) 250-50 MCG/DOSE AEPB Inhale 1 puff into the lungs 2 (two) times daily. 04/07/16  Yes Biagio Borg, MD  HYDROcodone-acetaminophen (NORCO/VICODIN) 5-325 MG tablet Take 1 tablet by mouth 4 (four) times daily as needed. Patient taking differently: Take 1 tablet by mouth 4 (four) times daily as needed for moderate pain or severe pain.  07/25/16  Yes Biagio Borg, MD  ibuprofen (ADVIL,MOTRIN) 600 MG tablet Take 300 mg by mouth 2 (two) times daily as needed for moderate pain.   Yes [provider]  losartan-hydrochlorothiazide (HYZAAR) 100-25 MG tablet Take 1 tablet by mouth every morning. 12/07/15  Yes Biagio Borg, MD  Polyvinyl Alcohol-Povidone (CLEAR EYES ALL SEASONS) 5-6 MG/ML SOLN Place 2 drops into both eyes daily.   Yes [provider]  conjugated estrogens (PREMARIN) vaginal cream Place 1 Applicatorful vaginally daily. Patient not taking: Reported on 07/28/2016 07/25/16   Biagio Borg, MD  phenazopyridine (PYRIDIUM) 100 MG tablet Take 1 tablet (100 mg total) by mouth 3 (three) times daily as needed for pain. Patient not taking: Reported on 07/28/2016 07/25/16   Biagio Borg, MD     Vital Signs: BP 131/79 (BP Location: Left  Arm)   Pulse 82   Temp 97.7 F (36.5 C) (Oral)   Resp 18   Ht 5\' 1"  (1.549 m)   Wt 295 lb (133.8 kg)   SpO2 99%   BMI 55.74 kg/m   Physical Exam  NAD, alert Abd:  Soft, non-distended, pelvic drain in place.  Insertion site is c/d/i.  Continues with serosanguinous output.   Imaging: Ct Abdomen Pelvis W Contrast  Result Date: 07/28/2016 CLINICAL DATA:  Back pain, dysuria, leukocytosis EXAM: CT ABDOMEN AND PELVIS WITH CONTRAST TECHNIQUE: Multidetector CT imaging of the abdomen and pelvis was performed using the standard protocol following bolus administration of intravenous contrast. CONTRAST:  119mL ISOVUE-300 IOPAMIDOL (ISOVUE-300) INJECTION 61% COMPARISON:  01/09/2011 FINDINGS: Lower chest: Lung bases are clear. Hepatobiliary: 3.5 cm hemangioma along the right hepatic dome (series 3/ image 13), incompletely visualized. Additional smaller lesions are poorly evaluated. Gallbladder is unremarkable. No intrahepatic or extrahepatic ductal dilatation. Pancreas: Within normal limits. Spleen: Within normal limits. Adrenals/Urinary Tract: Adrenal glands are within normal limits. Right kidney is within normal limits. Two nonobstructing left lower pole renal calculi measuring up to 5 mm (coronal image 68). No ureteral or bladder calculi. No hydronephrosis. Bladder is notable for a 9.8 x 5.4 x 4.5 cm intramural abscess along the left superior bladder dome (series 3/ image 68; coronal image 65). Stomach/Bowel: Stomach is within normal limits. No evidence of bowel obstruction. Normal appendix (series 3/ image 60). Suspected fistula between the sigmoid colon and the intramural bladder abscess (coronal image 64). Vascular/Lymphatic: No evidence of abdominal aortic aneurysm. Atherosclerotic calcifications of the  abdominal aorta and branch vessels. Small retroperitoneal lymph nodes which do not meet pathologic CT size criteria. Reproductive: Status post hysterectomy. Right ovary is within normal limits. Intramural  bladder abscess is difficult to separate from the left ovary (series 3/image 64). Other: No abdominopelvic ascites. No free air. Musculoskeletal: Degenerative changes of the visualized thoracolumbar spine. IMPRESSION: 9.8 x 5.4 x 4.5 cm intramural abscess along the left superior bladder dome. Associated fistulous communication with the sigmoid colon. Possible involvement of the left ovary. No ascites or free air. Electronically Signed   By: Julian Hy M.D.   On: 07/28/2016 09:06   Ct Image Guided Drainage By Percutaneous Catheter  Result Date: 07/29/2016 INDICATION: Pelvic abscess at the dome of the bladder EXAM: CT GUIDED DRAINAGE OF PELVIC ABSCESS MEDICATIONS: The patient is currently admitted to the hospital and receiving intravenous antibiotics. The antibiotics were administered within an appropriate time frame prior to the initiation of the procedure. ANESTHESIA/SEDATION: Four mg IV Versed 200 mcg IV Fentanyl Moderate Sedation Time:  42 The patient was continuously monitored during the procedure by the interventional radiology nurse under my direct supervision. COMPLICATIONS: None immediate. TECHNIQUE: Informed written consent was obtained from the patient after a thorough discussion of the procedural risks, benefits and alternatives. All questions were addressed. Maximal Sterile Barrier Technique was utilized including caps, mask, sterile gowns, sterile gloves, sterile drape, hand hygiene and skin antiseptic. A timeout was performed prior to the initiation of the procedure. PROCEDURE: The lower abdomen was prepped with ChloraPrep in a sterile fashion, and a sterile drape was applied covering the operative field. A sterile gown and sterile gloves were used for the procedure. Local anesthesia was provided with 1% Lidocaine. Under CT guidance, an 18 gauge needle was advanced into the pelvic abscess at the dome of the bladder. Cloudy fluid was aspirated. An Amplatz wire was coiled in the fluid  collection. 50 cc iodinated contrast was injected with delayed imaging of the bladder. This was performed to insure that the wire was coiled in the abscess and outside of the bladder. A 10 French dilator followed by a 10 [place] drain was inserted and coiled in the abscess cavity. 30 cc brown pus was aspirated. It was string fixed and sewn to the skin. FINDINGS: Imaging demonstrates needle placement and wire coiled within the a abscess at the dome of the bladder. The CT cystogram demonstrates contrast filling the bladder adjacent to the abscess cavity. The wire is coiled in the abscess cavity Final imaging demonstrates 10 French drain placement in the pelvic abscess. IMPRESSION: Successful 10 French pelvic abscess drainage yielding pus. Electronically Signed   By: Marybelle Killings M.D.   On: 07/29/2016 16:27    Labs:  CBC:  Recent Labs  07/28/16 0600 07/29/16 0505 07/30/16 0642 07/31/16 0910  WBC 16.9* 16.6* 11.6* 11.6*  HGB 13.4 11.8* 11.5* 12.2  HCT 41.6 37.4 36.7 39.3  PLT 339 317 315 350    COAGS:  Recent Labs  07/28/16 1400  INR 1.11  APTT 29    BMP:  Recent Labs  07/28/16 0600 07/29/16 0505 07/29/16 1125 07/30/16 0642 07/31/16 0910  NA 139 137  --  137 143  K 3.0* 2.5* 3.0* 2.9* 3.6  CL 101 97*  --  102 106  CO2 27 29  --  30 28  GLUCOSE 98 102*  --  155* 112*  BUN 11 9  --  10 10  CALCIUM 9.0 8.4*  --  8.4* 9.1  CREATININE 0.82  0.87  --  1.06* 0.86  GFRNONAA >60 >60  --  58* >60  GFRAA >60 >60  --  >60 >60    LIVER FUNCTION TESTS:  Recent Labs  07/10/16 0936 07/28/16 0744 07/29/16 0505 07/30/16 0642  BILITOT 0.4 0.5 0.5 0.1*  AST 32 29 24 25   ALT 29 21 18 18   ALKPHOS 91 89 85 73  PROT 7.5 6.9 6.5 6.1*  ALBUMIN 3.9 3.1* 2.7* 2.6*    Assessment and Plan: Bladder wall abscess s/p drain placement by Dr. Barbie Banner 6/19 Drain to remain in place.  Continues with increased daily output.  Cultures pending.  Continues on IV abx. Continue routine drain care.    IR to follow.   Electronically Signed: Docia Barrier, PA 07/31/2016, 10:24 AM   I spent a total of 15 Minutes at the the patient's bedside AND on the patient's hospital floor or unit, greater than 50% of which was counseling/coordinating care for bladder wall abscess

## 2016-07-31 NOTE — Progress Notes (Signed)
Patient refuses IV fluids with Potassium, stating she is willing to take PO. Patient is also refusing Lovenox, stating she will ambulate. Will continue to monitor and educate. Jimmie Molly, RN

## 2016-08-01 DIAGNOSIS — K5730 Diverticulosis of large intestine without perforation or abscess without bleeding: Secondary | ICD-10-CM

## 2016-08-01 DIAGNOSIS — E669 Obesity, unspecified: Secondary | ICD-10-CM

## 2016-08-01 DIAGNOSIS — E876 Hypokalemia: Secondary | ICD-10-CM

## 2016-08-01 DIAGNOSIS — I10 Essential (primary) hypertension: Secondary | ICD-10-CM

## 2016-08-01 DIAGNOSIS — F1010 Alcohol abuse, uncomplicated: Secondary | ICD-10-CM

## 2016-08-01 DIAGNOSIS — E785 Hyperlipidemia, unspecified: Secondary | ICD-10-CM

## 2016-08-01 LAB — CBC WITH DIFFERENTIAL/PLATELET
Basophils Absolute: 0 10*3/uL (ref 0.0–0.1)
Basophils Relative: 0 %
Eosinophils Absolute: 0.4 10*3/uL (ref 0.0–0.7)
Eosinophils Relative: 4 %
HCT: 37.6 % (ref 36.0–46.0)
Hemoglobin: 11.5 g/dL — ABNORMAL LOW (ref 12.0–15.0)
Lymphocytes Relative: 24 %
Lymphs Abs: 2.5 10*3/uL (ref 0.7–4.0)
MCH: 28 pg (ref 26.0–34.0)
MCHC: 30.6 g/dL (ref 30.0–36.0)
MCV: 91.7 fL (ref 78.0–100.0)
Monocytes Absolute: 0.5 10*3/uL (ref 0.1–1.0)
Monocytes Relative: 5 %
Neutro Abs: 6.8 10*3/uL (ref 1.7–7.7)
Neutrophils Relative %: 67 %
Platelets: 352 10*3/uL (ref 150–400)
RBC: 4.1 MIL/uL (ref 3.87–5.11)
RDW: 13.6 % (ref 11.5–15.5)
WBC: 10.1 10*3/uL (ref 4.0–10.5)

## 2016-08-01 LAB — COMPREHENSIVE METABOLIC PANEL
ALT: 19 U/L (ref 14–54)
AST: 24 U/L (ref 15–41)
Albumin: 2.7 g/dL — ABNORMAL LOW (ref 3.5–5.0)
Alkaline Phosphatase: 68 U/L (ref 38–126)
Anion gap: 7 (ref 5–15)
BUN: 9 mg/dL (ref 6–20)
CO2: 26 mmol/L (ref 22–32)
Calcium: 8.8 mg/dL — ABNORMAL LOW (ref 8.9–10.3)
Chloride: 109 mmol/L (ref 101–111)
Creatinine, Ser: 0.85 mg/dL (ref 0.44–1.00)
GFR calc Af Amer: >60 mL/min (ref >60)
GFR calc non Af Amer: >60 mL/min (ref >60)
Glucose, Bld: 102 mg/dL — ABNORMAL HIGH (ref 65–99)
Potassium: 3.8 mmol/L (ref 3.5–5.1)
Sodium: 142 mmol/L (ref 135–145)
Total Bilirubin: 0.3 mg/dL (ref 0.3–1.2)
Total Protein: 6.2 g/dL — ABNORMAL LOW (ref 6.5–8.1)

## 2016-08-01 LAB — GLUCOSE, CAPILLARY: Glucose-Capillary: 85 mg/dL (ref 65–99)

## 2016-08-01 LAB — MAGNESIUM: Magnesium: 2.2 mg/dL (ref 1.7–2.4)

## 2016-08-01 MED ORDER — CEFPODOXIME PROXETIL 200 MG PO TABS
200.0000 mg | ORAL_TABLET | Freq: Two times a day (BID) | ORAL | Status: DC
  Administered 2016-08-01 – 2016-08-02 (×3): 200 mg via ORAL
  Filled 2016-08-01 (×3): qty 1

## 2016-08-01 NOTE — Progress Notes (Signed)
Referring Physician(s):  Dr. Carmin Muskrat  Supervising Physician: Daryll Brod  Patient Status:  [facility] - In-pt  Chief Complaint:  Bladder wall abscess  Subjective: No complaints today.  Has been able to continue ambulating and has had a BM.   Allergies: Morphine; Shrimp [shellfish allergy]; Tramadol; and Latex  Medications: Prior to Admission medications   Medication Sig Start Date End Date Taking? Authorizing Provider  albuterol (PROVENTIL HFA;VENTOLIN HFA) 108 (90 Base) MCG/ACT inhaler Inhale 1-2 puffs into the lungs every 6 (six) hours as needed for wheezing. 04/18/16  Yes Larene Pickett, PA-C  atorvastatin (LIPITOR) 10 MG tablet Take 1 tablet (10 mg total) by mouth daily. 12/07/15  Yes Biagio Borg, MD  Fluticasone-Salmeterol (ADVAIR DISKUS) 250-50 MCG/DOSE AEPB Inhale 1 puff into the lungs 2 (two) times daily. 04/07/16  Yes Biagio Borg, MD  HYDROcodone-acetaminophen (NORCO/VICODIN) 5-325 MG tablet Take 1 tablet by mouth 4 (four) times daily as needed. Patient taking differently: Take 1 tablet by mouth 4 (four) times daily as needed for moderate pain or severe pain.  07/25/16  Yes Biagio Borg, MD  ibuprofen (ADVIL,MOTRIN) 600 MG tablet Take 300 mg by mouth 2 (two) times daily as needed for moderate pain.   Yes [provider]  losartan-hydrochlorothiazide (HYZAAR) 100-25 MG tablet Take 1 tablet by mouth every morning. 12/07/15  Yes Biagio Borg, MD  Polyvinyl Alcohol-Povidone (CLEAR EYES ALL SEASONS) 5-6 MG/ML SOLN Place 2 drops into both eyes daily.   Yes [provider]  conjugated estrogens (PREMARIN) vaginal cream Place 1 Applicatorful vaginally daily. Patient not taking: Reported on 07/28/2016 07/25/16   Biagio Borg, MD  phenazopyridine (PYRIDIUM) 100 MG tablet Take 1 tablet (100 mg total) by mouth 3 (three) times daily as needed for pain. Patient not taking: Reported on 07/28/2016 07/25/16   Biagio Borg, MD     Vital Signs: BP 134/86 (BP  Location: Left Arm)   Pulse 69   Temp 98.1 F (36.7 C) (Oral)   Resp 18   Ht 5\' 1"  (1.549 m)   Wt 295 lb (133.8 kg)   SpO2 97%   BMI 55.74 kg/m   Physical Exam  NAD, alert Abd:  Soft, non-distended, pelvic drain in place.  Insertion site is c/d/i.  Continues with serosanguinous output- 25 mL output yesterday.   Imaging: Ct Image Guided Drainage By Percutaneous Catheter  Result Date: 07/29/2016 INDICATION: Pelvic abscess at the dome of the bladder EXAM: CT GUIDED DRAINAGE OF PELVIC ABSCESS MEDICATIONS: The patient is currently admitted to the hospital and receiving intravenous antibiotics. The antibiotics were administered within an appropriate time frame prior to the initiation of the procedure. ANESTHESIA/SEDATION: Four mg IV Versed 200 mcg IV Fentanyl Moderate Sedation Time:  42 The patient was continuously monitored during the procedure by the interventional radiology nurse under my direct supervision. COMPLICATIONS: None immediate. TECHNIQUE: Informed written consent was obtained from the patient after a thorough discussion of the procedural risks, benefits and alternatives. All questions were addressed. Maximal Sterile Barrier Technique was utilized including caps, mask, sterile gowns, sterile gloves, sterile drape, hand hygiene and skin antiseptic. A timeout was performed prior to the initiation of the procedure. PROCEDURE: The lower abdomen was prepped with ChloraPrep in a sterile fashion, and a sterile drape was applied covering the operative field. A sterile gown and sterile gloves were used for the procedure. Local anesthesia was provided with 1% Lidocaine. Under CT guidance, an 18 gauge needle was advanced into  the pelvic abscess at the dome of the bladder. Cloudy fluid was aspirated. An Amplatz wire was coiled in the fluid collection. 50 cc iodinated contrast was injected with delayed imaging of the bladder. This was performed to insure that the wire was coiled in the abscess and  outside of the bladder. A 10 French dilator followed by a 10 [place] drain was inserted and coiled in the abscess cavity. 30 cc brown pus was aspirated. It was string fixed and sewn to the skin. FINDINGS: Imaging demonstrates needle placement and wire coiled within the a abscess at the dome of the bladder. The CT cystogram demonstrates contrast filling the bladder adjacent to the abscess cavity. The wire is coiled in the abscess cavity Final imaging demonstrates 10 French drain placement in the pelvic abscess. IMPRESSION: Successful 10 French pelvic abscess drainage yielding pus. Electronically Signed   By: Marybelle Killings M.D.   On: 07/29/2016 16:27    Labs:  CBC:  Recent Labs  07/29/16 0505 07/30/16 0642 07/31/16 0910 08/01/16 0652  WBC 16.6* 11.6* 11.6* 10.1  HGB 11.8* 11.5* 12.2 11.5*  HCT 37.4 36.7 39.3 37.6  PLT 317 315 350 352    COAGS:  Recent Labs  07/28/16 1400  INR 1.11  APTT 29    BMP:  Recent Labs  07/29/16 0505 07/29/16 1125 07/30/16 0642 07/31/16 0910 08/01/16 0652  NA 137  --  137 143 142  K 2.5* 3.0* 2.9* 3.6 3.8  CL 97*  --  102 106 109  CO2 29  --  30 28 26   GLUCOSE 102*  --  155* 112* 102*  BUN 9  --  10 10 9   CALCIUM 8.4*  --  8.4* 9.1 8.8*  CREATININE 0.87  --  1.06* 0.86 0.85  GFRNONAA >60  --  58* >60 >60  GFRAA >60  --  >60 >60 >60    LIVER FUNCTION TESTS:  Recent Labs  07/28/16 0744 07/29/16 0505 07/30/16 0642 08/01/16 0652  BILITOT 0.5 0.5 0.1* 0.3  AST 29 24 25 24   ALT 21 18 18 19   ALKPHOS 89 85 73 68  PROT 6.9 6.5 6.1* 6.2*  ALBUMIN 3.1* 2.7* 2.6* 2.7*    Assessment and Plan: Bladder wall abscess s/p drain placement by Dr. Barbie Banner 6/19 Drain to remain in place.  Continues with increased daily output.  Cultures pending but prelim results show E Coli.  Continues on abx. Continue routine drain care.  Have placed orders for output follow-up as well as drain care instructions in the event patient stable for discharge over the  weekend. Patient aware.  IR available if needed.   Electronically Signed: Docia Barrier, PA 08/01/2016, 11:50 AM   I spent a total of 15 Minutes at the the patient's bedside AND on the patient's hospital floor or unit, greater than 50% of which was counseling/coordinating care for bladder wall abscess

## 2016-08-01 NOTE — Progress Notes (Signed)
Patient ID: [name], female   DOB: September 13, 1960, 56 y.o.   MRN: [id number]                                                                PROGRESS NOTE                                                                                                                                                                                                             Patient Demographics:    [name], is a 56 y.o. female, DOB - [birth year], NUU:[id number]  Admit date - 07/28/2016   Admitting Physician Waldemar Dickens, MD  Outpatient Primary MD for the patient is Biagio Borg, MD  LOS - 4  Outpatient Specialists:     Chief Complaint  Patient presents with  . Back Pain  . Dysuria       Brief Narrative  56 y.o. female with medical history significant for obesity, asthma, hypertension, dyslipidemia, history of diverticulitis with known pancolonic diverticula based on recent colonoscopy on 07/25/16, and GERD who presented with continued dysuria, urinary frequency with urgency as well as subjective fevers and suprapubic and low back pain despite treatment with antibiotics and Pyridium. Patient was initially seen in the ER on 6/4 and was prescribed Keflex. She returned to the ER on 6/7 due to non-improvement in symptoms and was given Pyridium. Initial urine culture and repeat culture on 6/15 with multiple species present. Today patient had leukocytosis with white count of 16,900. CT abdomen and pelvis today revealed a 9.8 x 5.4 x 4.5 internal abscess on the left superior bladder dome with the appearance of an associated fistulous communication with the sigmoid colon as well as possible involvement of the left ovary. Patient has been evaluated by general surgery and urology while in the ER and at this time no definitive surgical treatment has been deemed necessary. We have been asked to admit for medical management of bladder wall abscess of indeterminate etiology. In retrospect, patient reports that  beginning in mid May she noticed painful urination and by the time she presented in June she began having back pain and suprapubic pain.  ED Course:  Vital Signs: BP 133/81   Pulse 98   Temp 98.1 F (36.7 C) (Oral)   Resp 18  Ht 5\' 1"  (1.549 m)   Wt 133.8 kg (295 lb)   SpO2 98%   BMI 55.74 kg/m  2 view CXR: Pulmonary vascular prominence and venous hypertensive changes without overt pulmonary edema or pleural effusions CT abdomen and pelvis with contrast: As above Lab data: Sodium 139, potassium 3.0, chloride 101, CO2 27, glucose 98, BUN 11, creatinine 0.82, anion gap 11, LFTs normal, albumin 3.1, white count 16,900 differential not obtained, urinalysis abnormal with hazy appearance, rare bacteria, amber color, 100 protein Medications and treatments: Normal saline bolus 1 L, Toradol 15 mg IV 1, Zosyn 3.375 g IV 1, Vicodin 5-3 252 tablets    Subjective:    [name] is reporting that she is having lots of flatus but no BM.    Assessment  & Plan :    Principal Problem:   Abscess of bladder Active Problems:   Hypertension   Asthma   Alcohol abuse   Obesity (BMI 30-39.9)   Colonic diverticulum   Acute hypokalemia   HLD (hyperlipidemia)  Abscess of bladder/Colonic diverticulum -s/p percutaneous drainage, awaiting culture results, culture: E coli sensitive to pip/tazo, ceftazidime, switch to oral cefpodoxime 6/22, hopefully can discharge home tomorrow. Pt to follow up with Dr. Joyice Faster in 4 weeks, Dr. Jeffie Pollock (urology) for cysto in 1-2 months, follow up with [facility].   Hypokalemia Continue oral replacement    Hypertension -Continue preadmission losartan -Holding thiazide diuretic    Alcohol abuse -Patient reports social drinking only without daily ingestion -Last drank this past Saturday at a friend's birthday party    Acute hypokalemia  Resolved now.   -Likely secondary to thiazide diuretic as well as poor oral intake prior to admission -Oral  repletion -Follow labs    Asthma -Continue preadmission MDIs -Currently stable without active wheezing or reports of shortness of breath   Morbid Obesity -Encouraged ongoing exercise as she has been doing    HLD (hyperlipidemia) -Hold preadmission statin until diet advanced   DVT prophylaxis: Pt refused lovenox but has been walking the halls  Code Status: Full  Family Communication: husband Disposition Plan: Home Consults called: Gen. Surgery/[facility]; Urology/[facility]; IR/[facility]  Lab Results  Component Value Date   PLT 352 08/01/2016   Antibiotics  :  Zosyn 6/18=>6/22   cefpodoxime 6/22  Anti-infectives    Start     Dose/Rate Route Frequency Ordered Stop   08/01/16 1400  cefpodoxime (VANTIN) tablet 200 mg     200 mg Oral Every 12 hours 08/01/16 1245     07/28/16 2200  piperacillin-tazobactam (ZOSYN) IVPB 3.375 g  Status:  Discontinued     3.375 g 100 mL/hr over 30 Minutes Intravenous Every 8 hours 07/28/16 2001 07/28/16 2102   07/28/16 2200  piperacillin-tazobactam (ZOSYN) IVPB 3.375 g  Status:  Discontinued     3.375 g 12.5 mL/hr over 240 Minutes Intravenous Every 8 hours 07/28/16 2102 08/01/16 1245   07/28/16 1015  piperacillin-tazobactam (ZOSYN) IVPB 3.375 g     3.375 g 12.5 mL/hr over 240 Minutes Intravenous  Once 07/28/16 1005 07/28/16 1453        Objective:   Vitals:   07/31/16 1631 07/31/16 2145 08/01/16 0625 08/01/16 1356  BP: 127/81 109/61 134/86 123/72  Pulse: 85 82 69 95  Resp: 17 18 18    Temp: 98.3 F (36.8 C) 98.4 F (36.9 C) 98.1 F (36.7 C) 98.2 F (36.8 C)  TempSrc: Oral Oral Oral Oral  SpO2: 98% 100% 97% 97%  Weight:  Height:        Wt Readings from Last 3 Encounters:  07/28/16 133.8 kg (295 lb)  07/25/16 132.9 kg (293 lb)  07/10/16 135.6 kg (299 lb)     Intake/Output Summary (Last 24 hours) at 08/01/16 1528 Last data filed at 08/01/16 1300  Gross per 24 hour  Intake              445 ml  Output              225 ml  Net               220 ml   Physical Exam  NCAT, Awake Alert, Oriented X 3, Normal affect Supple Neck,No JVD, No cervical lymphadenopathy.  Symmetrical Chest wall movement, Good air movement bilaterally, CTAB RRR,No Gallops,Rubs or new Murmurs, No Parasternal Heave Normal BS Abd Soft, No tenderness, No organomegaly appriciated, No rebound - guarding or rigidity. No Cyanosis, Clubbing or edema, No new Rash or bruise   Drain noted with copious output, less cloudy and red, more clear fluid color     Data Review:    CBC  Recent Labs Lab 07/28/16 0600 07/29/16 0505 07/30/16 0642 07/31/16 0910 08/01/16 0652  WBC 16.9* 16.6* 11.6* 11.6* 10.1  HGB 13.4 11.8* 11.5* 12.2 11.5*  HCT 41.6 37.4 36.7 39.3 37.6  PLT 339 317 315 350 352  MCV 90.4 90.6 91.5 91.4 91.7  MCH 29.1 28.6 28.7 28.4 28.0  MCHC 32.2 31.6 31.3 31.0 30.6  RDW 13.6 13.4 13.4 13.5 13.6  LYMPHSABS  --   --   --   --  2.5  MONOABS  --   --   --   --  0.5  EOSABS  --   --   --   --  0.4  BASOSABS  --   --   --   --  0.0    Chemistries   Recent Labs Lab 07/28/16 0600 07/28/16 0744 07/28/16 2125 07/29/16 0505 07/29/16 1125 07/30/16 0642 07/31/16 0910 08/01/16 0652  NA 139  --   --  137  --  137 143 142  K 3.0*  --   --  2.5* 3.0* 2.9* 3.6 3.8  CL 101  --   --  97*  --  102 106 109  CO2 27  --   --  29  --  30 28 26   GLUCOSE 98  --   --  102*  --  155* 112* 102*  BUN 11  --   --  9  --  10 10 9   CREATININE 0.82  --   --  0.87  --  1.06* 0.86 0.85  CALCIUM 9.0  --   --  8.4*  --  8.4* 9.1 8.8*  MG  --   --  2.1  --   --   --  2.2 2.2  AST  --  29  --  24  --  25  --  24  ALT  --  21  --  18  --  18  --  19  ALKPHOS  --  89  --  85  --  73  --  68  BILITOT  --  0.5  --  0.5  --  0.1*  --  0.3   ------------------------------------------------------------------------------------------------------------------ No results for input(s): CHOL, HDL, LDLCALC, TRIG, CHOLHDL, LDLDIRECT in the last 72 hours.  Lab Results    Component Value Date   HGBA1C 5.9 04/17/2016   ------------------------------------------------------------------------------------------------------------------  No results for input(s): TSH, T4TOTAL, T3FREE, THYROIDAB in the last 72 hours.  Invalid input(s): FREET3 ------------------------------------------------------------------------------------------------------------------ No results for input(s): VITAMINB12, FOLATE, FERRITIN, TIBC, IRON, RETICCTPCT in the last 72 hours.  Coagulation profile  Recent Labs Lab 07/28/16 1400  INR 1.11    No results for input(s): DDIMER in the last 72 hours.  Cardiac Enzymes No results for input(s): CKMB, TROPONINI, MYOGLOBIN in the last 168 hours.  Invalid input(s): CK ------------------------------------------------------------------------------------------------------------------    Component Value Date/Time   BNP 27.0 04/18/2016 1201    Inpatient Medications  Scheduled Meds: . cefpodoxime  200 mg Oral Q12H  . docusate sodium  100 mg Oral BID  . enoxaparin (LOVENOX) injection  40 mg Subcutaneous Q24H  . losartan  100 mg Oral Daily  . mometasone-formoterol  2 puff Inhalation BID  . polyethylene glycol  17 g Oral Daily  . potassium chloride  20 mEq Oral BID  . senna-docusate  2 tablet Oral BID   Continuous Infusions:  PRN Meds:.albuterol, alum & mag hydroxide-simeth, bisacodyl, HYDROcodone-acetaminophen  Micro Results Recent Results (from the past 240 hour(s))  Urine Culture     Status: None   Collection Time: 07/25/16  9:05 AM  Result Value Ref Range Status   Organism ID, Bacteria   Final    Multiple organisms present,each less than 10,000 CFU/mL. These organisms,commonly found on external and internal genitalia,are considered colonizers. No further testing performed.   Urine culture     Status: Abnormal   Collection Time: 07/28/16  5:39 AM  Result Value Ref Range Status   Specimen Description URINE, CLEAN CATCH   Final   Special Requests NONE  Final   Culture MULTIPLE SPECIES PRESENT, SUGGEST RECOLLECTION (A)  Final   Report Status 07/29/2016 FINAL  Final  Aerobic/Anaerobic Culture (surgical/deep wound)     Status: None (Preliminary result)   Collection Time: 07/29/16  4:19 PM  Result Value Ref Range Status   Specimen Description ABSCESS LEFT PELVIS  Final   Special Requests NONE  Final   Gram Stain   Final    ABUNDANT WBC PRESENT, PREDOMINANTLY PMN ABUNDANT GRAM NEGATIVE RODS ABUNDANT GRAM POSITIVE COCCI IN PAIRS FEW GRAM POSITIVE RODS    Culture   Final    ABUNDANT ESCHERICHIA COLI NO ANAEROBES ISOLATED; CULTURE IN PROGRESS FOR 5 DAYS    Report Status PENDING  Incomplete   Organism ID, Bacteria ESCHERICHIA COLI  Final      Susceptibility   Escherichia coli - MIC*    AMPICILLIN >=32 RESISTANT Resistant     CEFAZOLIN <=4 SENSITIVE Sensitive     CEFEPIME <=1 SENSITIVE Sensitive     CEFTAZIDIME <=1 SENSITIVE Sensitive     CEFTRIAXONE <=1 SENSITIVE Sensitive     CIPROFLOXACIN >=4 RESISTANT Resistant     GENTAMICIN <=1 SENSITIVE Sensitive     IMIPENEM <=0.25 SENSITIVE Sensitive     TRIMETH/SULFA >=320 RESISTANT Resistant     AMPICILLIN/SULBACTAM 16 INTERMEDIATE Intermediate     PIP/TAZO <=4 SENSITIVE Sensitive     Extended ESBL NEGATIVE Sensitive     * ABUNDANT ESCHERICHIA COLI    Radiology Reports Ct Abdomen Pelvis W Contrast  Result Date: 07/28/2016 CLINICAL DATA:  Back pain, dysuria, leukocytosis EXAM: CT ABDOMEN AND PELVIS WITH CONTRAST TECHNIQUE: Multidetector CT imaging of the abdomen and pelvis was performed using the standard protocol following bolus administration of intravenous contrast. CONTRAST:  155mL ISOVUE-300 IOPAMIDOL (ISOVUE-300) INJECTION 61% COMPARISON:  01/09/2011 FINDINGS: Lower chest: Lung bases are clear. Hepatobiliary: 3.5 cm  hemangioma along the right hepatic dome (series 3/ image 13), incompletely visualized. Additional smaller lesions are poorly evaluated.  Gallbladder is unremarkable. No intrahepatic or extrahepatic ductal dilatation. Pancreas: Within normal limits. Spleen: Within normal limits. Adrenals/Urinary Tract: Adrenal glands are within normal limits. Right kidney is within normal limits. Two nonobstructing left lower pole renal calculi measuring up to 5 mm (coronal image 68). No ureteral or bladder calculi. No hydronephrosis. Bladder is notable for a 9.8 x 5.4 x 4.5 cm intramural abscess along the left superior bladder dome (series 3/ image 68; coronal image 65). Stomach/Bowel: Stomach is within normal limits. No evidence of bowel obstruction. Normal appendix (series 3/ image 60). Suspected fistula between the sigmoid colon and the intramural bladder abscess (coronal image 64). Vascular/Lymphatic: No evidence of abdominal aortic aneurysm. Atherosclerotic calcifications of the abdominal aorta and branch vessels. Small retroperitoneal lymph nodes which do not meet pathologic CT size criteria. Reproductive: Status post hysterectomy. Right ovary is within normal limits. Intramural bladder abscess is difficult to separate from the left ovary (series 3/image 64). Other: No abdominopelvic ascites. No free air. Musculoskeletal: Degenerative changes of the visualized thoracolumbar spine. IMPRESSION: 9.8 x 5.4 x 4.5 cm intramural abscess along the left superior bladder dome. Associated fistulous communication with the sigmoid colon. Possible involvement of the left ovary. No ascites or free air. Electronically Signed   By: Julian Hy M.D.   On: 07/28/2016 09:06   Ct Image Guided Drainage By Percutaneous Catheter  Result Date: 07/29/2016 INDICATION: Pelvic abscess at the dome of the bladder EXAM: CT GUIDED DRAINAGE OF PELVIC ABSCESS MEDICATIONS: The patient is currently admitted to the hospital and receiving intravenous antibiotics. The antibiotics were administered within an appropriate time frame prior to the initiation of the procedure.  ANESTHESIA/SEDATION: Four mg IV Versed 200 mcg IV Fentanyl Moderate Sedation Time:  42 The patient was continuously monitored during the procedure by the interventional radiology nurse under my direct supervision. COMPLICATIONS: None immediate. TECHNIQUE: Informed written consent was obtained from the patient after a thorough discussion of the procedural risks, benefits and alternatives. All questions were addressed. Maximal Sterile Barrier Technique was utilized including caps, mask, sterile gowns, sterile gloves, sterile drape, hand hygiene and skin antiseptic. A timeout was performed prior to the initiation of the procedure. PROCEDURE: The lower abdomen was prepped with ChloraPrep in a sterile fashion, and a sterile drape was applied covering the operative field. A sterile gown and sterile gloves were used for the procedure. Local anesthesia was provided with 1% Lidocaine. Under CT guidance, an 18 gauge needle was advanced into the pelvic abscess at the dome of the bladder. Cloudy fluid was aspirated. An Amplatz wire was coiled in the fluid collection. 50 cc iodinated contrast was injected with delayed imaging of the bladder. This was performed to insure that the wire was coiled in the abscess and outside of the bladder. A 10 French dilator followed by a 10 [place] drain was inserted and coiled in the abscess cavity. 30 cc brown pus was aspirated. It was string fixed and sewn to the skin. FINDINGS: Imaging demonstrates needle placement and wire coiled within the a abscess at the dome of the bladder. The CT cystogram demonstrates contrast filling the bladder adjacent to the abscess cavity. The wire is coiled in the abscess cavity Final imaging demonstrates 10 French drain placement in the pelvic abscess. IMPRESSION: Successful 10 French pelvic abscess drainage yielding pus. Electronically Signed   By: Marybelle Killings M.D.   On: 07/29/2016 16:27  Time Spent in minutes  27  Shakerra Red M.D on 08/01/2016 at 3:28  PM  Between 7am to 7pm - Pager - [phone]  After 7pm go to [email or web address] - password [email or web address]  [facility] -  Office  [phone]

## 2016-08-02 LAB — BASIC METABOLIC PANEL
Anion gap: 8 (ref 5–15)
BUN: 11 mg/dL (ref 6–20)
CO2: 24 mmol/L (ref 22–32)
Calcium: 8.8 mg/dL — ABNORMAL LOW (ref 8.9–10.3)
Chloride: 110 mmol/L (ref 101–111)
Creatinine, Ser: 0.85 mg/dL (ref 0.44–1.00)
GFR calc Af Amer: >60 mL/min (ref >60)
GFR calc non Af Amer: >60 mL/min (ref >60)
Glucose, Bld: 100 mg/dL — ABNORMAL HIGH (ref 65–99)
Potassium: 4.5 mmol/L (ref 3.5–5.1)
Sodium: 142 mmol/L (ref 135–145)

## 2016-08-02 MED ORDER — HYDROCODONE-ACETAMINOPHEN 5-325 MG PO TABS: 1.0000 | ORAL_TABLET | Freq: Four times a day (QID) | ORAL | 0 refills | Status: DC | PRN

## 2016-08-02 MED ORDER — CEFPODOXIME PROXETIL 200 MG PO TABS
200.0000 mg | ORAL_TABLET | Freq: Two times a day (BID) | ORAL | 0 refills | Status: DC
Start: 2016-08-02 — End: 2016-08-12

## 2016-08-02 NOTE — Discharge Summary (Signed)
Physician Discharge Summary  [name] DGU:[id number] DOB: 12-06-1960 DOA: 07/28/2016  PCP: Biagio Borg, MD Urology: Jeffie Pollock Radiology: [name] Surgery: A. Marcello Moores  Admit date: 07/28/2016 Discharge date: 08/02/2016  Admitted From: Home  Disposition: Home   Recommendations for Outpatient Follow-up:  1. Follow up with PCP in 1 weeks 2. Follow up with IR drain clinic in 1-2 weeks 3. Follow up with urology Dr. Jeffie Pollock in 1-2 months for cystocopy 4. Please obtain BMP/CBC in one week  Home Health: RN  Discharge Condition: stable  CODE STATUS: full    Brief Hospitalization Summary: Please see all hospital notes, images, labs for full details of the hospitalization. HPI: [name] is a 56 y.o. female with medical history significant for obesity, asthma, hypertension, dyslipidemia, history of diverticulitis with known pancolonic diverticula based on recent colonoscopy on 07/25/16, and GERD who presented with continued dysuria, urinary frequency with urgency as well as subjective fevers and suprapubic and low back pain despite treatment with antibiotics and Pyridium. Patient was initially seen in the ER on 6/4 and was prescribed Keflex. She returned to the ER on 6/7 due to non-improvement in symptoms and was given Pyridium. Initial urine culture and repeat culture on 6/15 with multiple species present. Today patient had leukocytosis with white count of 16,900. CT abdomen and pelvis today revealed a 9.8 x 5.4 x 4.5 internal abscess on the left superior bladder dome with the appearance of an associated fistulous communication with the sigmoid colon as well as possible involvement of the left ovary. Patient has been evaluated by general surgery and urology while in the ER and at this time no definitive surgical treatment has been deemed necessary. We have been asked to admit for medical management of bladder wall abscess of indeterminate etiology. In retrospect, patient reports that beginning in mid  May she noticed painful urination and by the time she presented in June she began having back pain and suprapubic pain.  ED Course:  Vital Signs: BP 133/81   Pulse 98   Temp 98.1 F (36.7 C) (Oral)   Resp 18   Ht 5\' 1"  (1.549 m)   Wt 133.8 kg (295 lb)   SpO2 98%   BMI 55.74 kg/m  2 view CXR: Pulmonary vascular prominence and venous hypertensive changes without overt pulmonary edema or pleural effusions CT abdomen and pelvis with contrast: As above Lab data: Sodium 139, potassium 3.0, chloride 101, CO2 27, glucose 98, BUN 11, creatinine 0.82, anion gap 11, LFTs normal, albumin 3.1, white count 16,900 differential not obtained, urinalysis abnormal with hazy appearance, rare bacteria, amber color, 100 protein Medications and treatments: Normal saline bolus 1 L, Toradol 15 mg IV 1, Zosyn 3.375 g IV 1, Vicodin 5-3 252 tablets  Abscess of bladder/Colonic diverticulum -s/p percutaneous drainage, awaiting culture results, culture: E coli sensitive to pip/tazo, ceftazidime, switch to oral cefpodoxime 6/22, discharge home. Pt to follow up with Dr. Joyice Faster in 4 weeks, Dr. Jeffie Pollock (urology) for cysto in 1-2 months, follow up with [facility]. Discharged with 10 day course of cefpodoxime.   Hypokalemia Treated with oral replacement  Hypertension -Continue preadmission losartan  Alcohol abuse -Patient reports social drinking only without daily ingestion -Last drank this past Saturday at a friend's birthday party  Acute hypokalemia  Resolved now.   -Likely secondary to thiazide diuretic as well as poor oral intake prior to admission -Oral repletion -Fully repleted at 4.5 on day of discharge  Asthma -Continue preadmission MDIs -Currently stable  without active wheezing or reports of shortness of breath  Morbid Obesity -Encouraged ongoing exercise as she has been doing  HLD (hyperlipidemia) - resume home statin therapy  DVT prophylaxis:Pt refused  lovenox but has been walking the halls  Code Status:Full Family Communication:husband Disposition Plan:Home Consults called:Gen. Surgery/[facility]; Urology/[facility]; IR/[facility]  Discharge Diagnoses:  Principal Problem:   Abscess of bladder Active Problems:   Hypertension   Asthma   Alcohol abuse   Obesity (BMI 30-39.9)   Colonic diverticulum   Acute hypokalemia   HLD (hyperlipidemia)    Discharge Instructions: Discharge Instructions    Call MD for:  difficulty breathing, headache or visual disturbances    Complete by:  As directed    Call MD for:  extreme fatigue    Complete by:  As directed    Call MD for:  persistant dizziness or light-headedness    Complete by:  As directed    Call MD for:  persistant nausea and vomiting    Complete by:  As directed    Call MD for:  redness, tenderness, or signs of infection (pain, swelling, redness, odor or green/yellow discharge around incision site)    Complete by:  As directed    Call MD for:  severe uncontrolled pain    Complete by:  As directed    Call MD for:  temperature >100.4    Complete by:  As directed    Discharge instructions    Complete by:  As directed    Flush drain with 5 mL sterile saline 1-2 times daily.  Record volume of output from drain daily and keep a log.  Bring to outpatient appointment at IR drain clinic.  Keep dressing clean and dry.  Do not submerge drain.  Schedulers will contact you with date and time of appointment.   Increase activity slowly    Complete by:  As directed      Allergies as of 08/02/2016      Reactions   Morphine Itching   Shrimp [shellfish Allergy] Itching   Tongue burns   Tramadol Other (See Comments)   confusion   Latex Rash      Medication List    TAKE these medications   albuterol 108 (90 Base) MCG/ACT inhaler Commonly known as:  PROVENTIL HFA;VENTOLIN HFA Inhale 1-2 puffs into the lungs every 6 (six) hours as needed for wheezing.   atorvastatin 10 MG tablet Commonly  known as:  LIPITOR Take 1 tablet (10 mg total) by mouth daily.   cefpodoxime 200 MG tablet Commonly known as:  VANTIN Take 1 tablet (200 mg total) by mouth every 12 (twelve) hours.   CLEAR EYES ALL SEASONS 5-6 MG/ML Soln Generic drug:  Polyvinyl Alcohol-Povidone Place 2 drops into both eyes daily.   conjugated estrogens vaginal cream Commonly known as:  PREMARIN Place 1 Applicatorful vaginally daily.   Fluticasone-Salmeterol 250-50 MCG/DOSE Aepb Commonly known as:  ADVAIR DISKUS Inhale 1 puff into the lungs 2 (two) times daily.   HYDROcodone-acetaminophen 5-325 MG tablet Commonly known as:  NORCO/VICODIN Take 1 tablet by mouth 4 (four) times daily as needed for moderate pain or severe pain.   ibuprofen 600 MG tablet Commonly known as:  ADVIL,MOTRIN Take 300 mg by mouth 2 (two) times daily as needed for moderate pain.   losartan-hydrochlorothiazide 100-25 MG tablet Commonly known as:  HYZAAR Take 1 tablet by mouth every morning.   phenazopyridine 100 MG tablet Commonly known as:  PYRIDIUM Take 1 tablet (100 mg total) by mouth  3 (three) times daily as needed for pain.      Follow-up Information    Leighton Ruff, MD. Call in 4 week(s).   Specialty:  General Surgery Why:  Call to make an appointment with Dr. Marcello Moores to discuss colovesical fistula Contact information: [address] [place] [address] [phone]        Marybelle Killings, MD Follow up in 2 week(s).   Specialty:  Interventional Radiology Why:  1-2 Contact information: [address] [place] [address] [phone]        Irine Seal, MD. Schedule an appointment as soon as possible for a visit in 2 week(s).   Specialty:  Urology Why:  Hospital Follow Up fistula Contact information: [place] [address] [phone]        Biagio Borg, MD. Schedule an appointment as soon as possible for a visit in 1 week(s).   Specialties:  Internal Medicine,  Radiology Why:  Hospital Follow Up  Contact information: [place] [address] [phone]          Allergies  Allergen Reactions  . Morphine Itching  . Shrimp [Shellfish Allergy] Itching    Tongue burns  . Tramadol Other (See Comments)    confusion  . Latex Rash   Current Discharge Medication List    START taking these medications   Details  cefpodoxime (VANTIN) 200 MG tablet Take 1 tablet (200 mg total) by mouth every 12 (twelve) hours. Qty: 20 tablet, Refills: 0      CONTINUE these medications which have CHANGED   Details  HYDROcodone-acetaminophen (NORCO/VICODIN) 5-325 MG tablet Take 1 tablet by mouth 4 (four) times daily as needed for moderate pain or severe pain. Qty: 14 tablet, Refills: 0      CONTINUE these medications which have NOT CHANGED   Details  albuterol (PROVENTIL HFA;VENTOLIN HFA) 108 (90 Base) MCG/ACT inhaler Inhale 1-2 puffs into the lungs every 6 (six) hours as needed for wheezing. Qty: 1 Inhaler, Refills: 0    atorvastatin (LIPITOR) 10 MG tablet Take 1 tablet (10 mg total) by mouth daily. Qty: 90 tablet, Refills: 3    Fluticasone-Salmeterol (ADVAIR DISKUS) 250-50 MCG/DOSE AEPB Inhale 1 puff into the lungs 2 (two) times daily. Qty: 3 each, Refills: 2    ibuprofen (ADVIL,MOTRIN) 600 MG tablet Take 300 mg by mouth 2 (two) times daily as needed for moderate pain.    losartan-hydrochlorothiazide (HYZAAR) 100-25 MG tablet Take 1 tablet by mouth every morning. Qty: 90 tablet, Refills: 3    Polyvinyl Alcohol-Povidone (CLEAR EYES ALL SEASONS) 5-6 MG/ML SOLN Place 2 drops into both eyes daily.    conjugated estrogens (PREMARIN) vaginal cream Place 1 Applicatorful vaginally daily. Qty: 42.5 g, Refills: 12    phenazopyridine (PYRIDIUM) 100 MG tablet Take 1 tablet (100 mg total) by mouth 3 (three) times daily as needed for pain. Qty: 15 tablet, Refills: 1        Procedures/Studies: Ct Abdomen Pelvis W Contrast  Result  Date: 07/28/2016 CLINICAL DATA:  Back pain, dysuria, leukocytosis EXAM: CT ABDOMEN AND PELVIS WITH CONTRAST TECHNIQUE: Multidetector CT imaging of the abdomen and pelvis was performed using the standard protocol following bolus administration of intravenous contrast. CONTRAST:  168mL ISOVUE-300 IOPAMIDOL (ISOVUE-300) INJECTION 61% COMPARISON:  01/09/2011 FINDINGS: Lower chest: Lung bases are clear. Hepatobiliary: 3.5 cm hemangioma along the right hepatic dome (series 3/ image 13), incompletely visualized. Additional smaller lesions are poorly evaluated.  Gallbladder is unremarkable. No intrahepatic or extrahepatic ductal dilatation. Pancreas: Within normal limits. Spleen: Within normal limits. Adrenals/Urinary Tract: Adrenal glands are within normal limits. Right kidney is within normal limits. Two nonobstructing left lower pole renal calculi measuring up to 5 mm (coronal image 68). No ureteral or bladder calculi. No hydronephrosis. Bladder is notable for a 9.8 x 5.4 x 4.5 cm intramural abscess along the left superior bladder dome (series 3/ image 68; coronal image 65). Stomach/Bowel: Stomach is within normal limits. No evidence of bowel obstruction. Normal appendix (series 3/ image 60). Suspected fistula between the sigmoid colon and the intramural bladder abscess (coronal image 64). Vascular/Lymphatic: No evidence of abdominal aortic aneurysm. Atherosclerotic calcifications of the abdominal aorta and branch vessels. Small retroperitoneal lymph nodes which do not meet pathologic CT size criteria. Reproductive: Status post hysterectomy. Right ovary is within normal limits. Intramural bladder abscess is difficult to separate from the left ovary (series 3/image 64). Other: No abdominopelvic ascites. No free air. Musculoskeletal: Degenerative changes of the visualized thoracolumbar spine. IMPRESSION: 9.8 x 5.4 x 4.5 cm intramural abscess along the left superior bladder dome. Associated fistulous communication with the  sigmoid colon. Possible involvement of the left ovary. No ascites or free air. Electronically Signed   By: Julian Hy M.D.   On: 07/28/2016 09:06   Ct Image Guided Drainage By Percutaneous Catheter  Result Date: 07/29/2016 INDICATION: Pelvic abscess at the dome of the bladder EXAM: CT GUIDED DRAINAGE OF PELVIC ABSCESS MEDICATIONS: The patient is currently admitted to the hospital and receiving intravenous antibiotics. The antibiotics were administered within an appropriate time frame prior to the initiation of the procedure. ANESTHESIA/SEDATION: Four mg IV Versed 200 mcg IV Fentanyl Moderate Sedation Time:  42 The patient was continuously monitored during the procedure by the interventional radiology nurse under my direct supervision. COMPLICATIONS: None immediate. TECHNIQUE: Informed written consent was obtained from the patient after a thorough discussion of the procedural risks, benefits and alternatives. All questions were addressed. Maximal Sterile Barrier Technique was utilized including caps, mask, sterile gowns, sterile gloves, sterile drape, hand hygiene and skin antiseptic. A timeout was performed prior to the initiation of the procedure. PROCEDURE: The lower abdomen was prepped with ChloraPrep in a sterile fashion, and a sterile drape was applied covering the operative field. A sterile gown and sterile gloves were used for the procedure. Local anesthesia was provided with 1% Lidocaine. Under CT guidance, an 18 gauge needle was advanced into the pelvic abscess at the dome of the bladder. Cloudy fluid was aspirated. An Amplatz wire was coiled in the fluid collection. 50 cc iodinated contrast was injected with delayed imaging of the bladder. This was performed to insure that the wire was coiled in the abscess and outside of the bladder. A 10 French dilator followed by a 10 [place] drain was inserted and coiled in the abscess cavity. 30 cc brown pus was aspirated. It was string fixed and sewn to the  skin. FINDINGS: Imaging demonstrates needle placement and wire coiled within the a abscess at the dome of the bladder. The CT cystogram demonstrates contrast filling the bladder adjacent to the abscess cavity. The wire is coiled in the abscess cavity Final imaging demonstrates 10 French drain placement in the pelvic abscess. IMPRESSION: Successful 10 French pelvic abscess drainage yielding pus. Electronically Signed   By: Marybelle Killings M.D.   On: 07/29/2016 16:27      Subjective: Pt without complaints today.  Having bowel movements.   Discharge Exam: Vitals:  08/01/16 2025 08/02/16 0536  BP: (!) 155/86 (!) 143/92  Pulse: 72 88  Resp: 18 19  Temp: 98 F (36.7 C) 98.1 F (36.7 C)   Vitals:   08/01/16 1356 08/01/16 2025 08/02/16 0536 08/02/16 0838  BP: 123/72 (!) 155/86 (!) 143/92   Pulse: 95 72 88   Resp:  18 19   Temp: 98.2 F (36.8 C) 98 F (36.7 C) 98.1 F (36.7 C)   TempSrc: Oral Oral Oral   SpO2: 97% 100% 100%   Weight:    (!) 137.9 kg (304 lb 1.6 oz)  Height:       NCAT, Awake Alert, Oriented X 3, Normal affect Supple Neck,No JVD, No cervical lymphadenopathy.  Symmetrical Chest wall movement, Good air movement bilaterally, CTAB RRR,No Gallops,Rubs or new Murmurs, No Parasternal Heave Normal BS Abd Soft, No tenderness, No organomegaly appriciated, No rebound - guarding or rigidity. No Cyanosis, Clubbing or edema, No new Rash or bruise   Drain noted with good output, less cloudy and red, more clear fluid color   The results of significant diagnostics from this hospitalization (including imaging, microbiology, ancillary and laboratory) are listed below for reference.     Microbiology: Recent Results (from the past 240 hour(s))  Urine Culture     Status: None   Collection Time: 07/25/16  9:05 AM  Result Value Ref Range Status   Organism ID, Bacteria   Final    Multiple organisms present,each less than 10,000 CFU/mL. These organisms,commonly found on external and  internal genitalia,are considered colonizers. No further testing performed.   Urine culture     Status: Abnormal   Collection Time: 07/28/16  5:39 AM  Result Value Ref Range Status   Specimen Description URINE, CLEAN CATCH  Final   Special Requests NONE  Final   Culture MULTIPLE SPECIES PRESENT, SUGGEST RECOLLECTION (A)  Final   Report Status 07/29/2016 FINAL  Final  Aerobic/Anaerobic Culture (surgical/deep wound)     Status: None (Preliminary result)   Collection Time: 07/29/16  4:19 PM  Result Value Ref Range Status   Specimen Description ABSCESS LEFT PELVIS  Final   Special Requests NONE  Final   Gram Stain   Final    ABUNDANT WBC PRESENT, PREDOMINANTLY PMN ABUNDANT GRAM NEGATIVE RODS ABUNDANT GRAM POSITIVE COCCI IN PAIRS FEW GRAM POSITIVE RODS    Culture   Final    ABUNDANT ESCHERICHIA COLI NO ANAEROBES ISOLATED; CULTURE IN PROGRESS FOR 5 DAYS    Report Status PENDING  Incomplete   Organism ID, Bacteria ESCHERICHIA COLI  Final      Susceptibility   Escherichia coli - MIC*    AMPICILLIN >=32 RESISTANT Resistant     CEFAZOLIN <=4 SENSITIVE Sensitive     CEFEPIME <=1 SENSITIVE Sensitive     CEFTAZIDIME <=1 SENSITIVE Sensitive     CEFTRIAXONE <=1 SENSITIVE Sensitive     CIPROFLOXACIN >=4 RESISTANT Resistant     GENTAMICIN <=1 SENSITIVE Sensitive     IMIPENEM <=0.25 SENSITIVE Sensitive     TRIMETH/SULFA >=320 RESISTANT Resistant     AMPICILLIN/SULBACTAM 16 INTERMEDIATE Intermediate     PIP/TAZO <=4 SENSITIVE Sensitive     Extended ESBL NEGATIVE Sensitive     * ABUNDANT ESCHERICHIA COLI     Labs: BNP (last 3 results)  Recent Labs  04/18/16 1201  BNP 22.9   Basic Metabolic Panel:  Recent Labs Lab 07/28/16 2125 07/29/16 0505 07/29/16 1125 07/30/16 7989 07/31/16 0910 08/01/16 0652 08/02/16 0537  NA  --  137  --  137 143 142 142  K  --  2.5* 3.0* 2.9* 3.6 3.8 4.5  CL  --  97*  --  102 106 109 110  CO2  --  29  --  30 28 26 24   GLUCOSE  --  102*  --  155*  112* 102* 100*  BUN  --  9  --  10 10 9 11   CREATININE  --  0.87  --  1.06* 0.86 0.85 0.85  CALCIUM  --  8.4*  --  8.4* 9.1 8.8* 8.8*  MG 2.1  --   --   --  2.2 2.2  --   PHOS 3.1  --   --   --   --   --   --    Liver Function Tests:  Recent Labs Lab 07/28/16 0744 07/29/16 0505 07/30/16 0642 08/01/16 0652  AST 29 24 25 24   ALT 21 18 18 19   ALKPHOS 89 85 73 68  BILITOT 0.5 0.5 0.1* 0.3  PROT 6.9 6.5 6.1* 6.2*  ALBUMIN 3.1* 2.7* 2.6* 2.7*    Recent Labs Lab 07/28/16 0744  LIPASE 20   No results for input(s): AMMONIA in the last 168 hours. CBC:  Recent Labs Lab 07/28/16 0600 07/29/16 0505 07/30/16 0642 07/31/16 0910 08/01/16 0652  WBC 16.9* 16.6* 11.6* 11.6* 10.1  NEUTROABS  --   --   --   --  6.8  HGB 13.4 11.8* 11.5* 12.2 11.5*  HCT 41.6 37.4 36.7 39.3 37.6  MCV 90.4 90.6 91.5 91.4 91.7  PLT 339 317 315 350 352   Cardiac Enzymes: No results for input(s): CKTOTAL, CKMB, CKMBINDEX, TROPONINI in the last 168 hours. BNP: Invalid input(s): POCBNP CBG:  Recent Labs Lab 08/01/16 1711  GLUCAP 85   D-Dimer No results for input(s): DDIMER in the last 72 hours. Hgb A1c No results for input(s): HGBA1C in the last 72 hours. Lipid Profile No results for input(s): CHOL, HDL, LDLCALC, TRIG, CHOLHDL, LDLDIRECT in the last 72 hours. Thyroid function studies No results for input(s): TSH, T4TOTAL, T3FREE, THYROIDAB in the last 72 hours.  Invalid input(s): FREET3 Anemia work up No results for input(s): VITAMINB12, FOLATE, FERRITIN, TIBC, IRON, RETICCTPCT in the last 72 hours. Urinalysis    Component Value Date/Time   COLORURINE YELLOW 07/30/2016 [place] 07/30/2016 1734   LABSPEC 1.012 07/30/2016 1734   PHURINE 5.0 07/30/2016 1734   GLUCOSEU NEGATIVE 07/30/2016 1734   GLUCOSEU NEGATIVE 07/25/2016 0905   HGBUR NEGATIVE 07/30/2016 1734   BILIRUBINUR NEGATIVE 07/30/2016 1734   BILIRUBINUR negative 09/16/2011 1047   KETONESUR NEGATIVE 07/30/2016  1734   PROTEINUR NEGATIVE 07/30/2016 1734   UROBILINOGEN 0.2 07/25/2016 0905   NITRITE NEGATIVE 07/30/2016 1734   LEUKOCYTESUR NEGATIVE 07/30/2016 1734   Sepsis Labs Invalid input(s): PROCALCITONIN,  WBC,  LACTICIDVEN Microbiology Recent Results (from the past 240 hour(s))  Urine Culture     Status: None   Collection Time: 07/25/16  9:05 AM  Result Value Ref Range Status   Organism ID, Bacteria   Final    Multiple organisms present,each less than 10,000 CFU/mL. These organisms,commonly found on external and internal genitalia,are considered colonizers. No further testing performed.   Urine culture     Status: Abnormal   Collection Time: 07/28/16  5:39 AM  Result Value Ref Range Status   Specimen Description URINE, CLEAN CATCH  Final   Special Requests NONE  Final   Culture MULTIPLE SPECIES PRESENT,  SUGGEST RECOLLECTION (A)  Final   Report Status 07/29/2016 FINAL  Final  Aerobic/Anaerobic Culture (surgical/deep wound)     Status: None (Preliminary result)   Collection Time: 07/29/16  4:19 PM  Result Value Ref Range Status   Specimen Description ABSCESS LEFT PELVIS  Final   Special Requests NONE  Final   Gram Stain   Final    ABUNDANT WBC PRESENT, PREDOMINANTLY PMN ABUNDANT GRAM NEGATIVE RODS ABUNDANT GRAM POSITIVE COCCI IN PAIRS FEW GRAM POSITIVE RODS    Culture   Final    ABUNDANT ESCHERICHIA COLI NO ANAEROBES ISOLATED; CULTURE IN PROGRESS FOR 5 DAYS    Report Status PENDING  Incomplete   Organism ID, Bacteria ESCHERICHIA COLI  Final      Susceptibility   Escherichia coli - MIC*    AMPICILLIN >=32 RESISTANT Resistant     CEFAZOLIN <=4 SENSITIVE Sensitive     CEFEPIME <=1 SENSITIVE Sensitive     CEFTAZIDIME <=1 SENSITIVE Sensitive     CEFTRIAXONE <=1 SENSITIVE Sensitive     CIPROFLOXACIN >=4 RESISTANT Resistant     GENTAMICIN <=1 SENSITIVE Sensitive     IMIPENEM <=0.25 SENSITIVE Sensitive     TRIMETH/SULFA >=320 RESISTANT Resistant     AMPICILLIN/SULBACTAM 16  INTERMEDIATE Intermediate     PIP/TAZO <=4 SENSITIVE Sensitive     Extended ESBL NEGATIVE Sensitive     * ABUNDANT ESCHERICHIA COLI    Time coordinating discharge: 40 mins  SIGNED:  Irwin Brakeman, MD  [facility] 08/02/2016, 12:02 PM Pager [phone]  If 7PM-7AM, please contact night-coverage [email or web address] Password [email or web address]

## 2016-08-02 NOTE — Discharge Instructions (Signed)
Percutaneous Abscess Drain, Care After °This sheet gives you information about how to care for yourself after your procedure. Your health care provider may also give you more specific instructions. If you have problems or questions, contact your health care provider. °What can I expect after the procedure? °After your procedure, it is common to have: °· A small amount of bruising and discomfort in the area where the drainage tube (catheter) was placed. °· Sleepiness and fatigue. This should go away after the medicines you were given have worn off. ° °Follow these instructions at home: °Incision care °· Follow instructions from your health care provider about how to take care of your incision. Make sure you: °? Wash your hands with soap and water before you change your bandage (dressing). If soap and water are not available, use hand sanitizer. °? Change your dressing as told by your health care provider. °? Leave stitches (sutures), skin glue, or adhesive strips in place. These skin closures may need to stay in place for 2 weeks or longer. If adhesive strip edges start to loosen and curl up, you may trim the loose edges. Do not remove adhesive strips completely unless your health care provider tells you to do that. °· Check your incision area every day for signs of infection. Check for: °? More redness, swelling, or pain. °? More fluid or blood. °? Warmth. °? Pus or a bad smell. °? Fluid leaking from around your catheter (instead of fluid draining through your catheter). °Catheter care °· Follow instructions from your health care provider about emptying and cleaning your catheter and collection bag. You may need to clean the catheter every day so it does not clog. °· If directed, write down the following information every time you empty your bag: °? The date and time. °? The amount of drainage. °General instructions °· Rest at home for 1-2 days after your procedure. Return to your normal activities as told by your  health care provider. °· Do not take baths, swim, or use a hot tub for 24 hours after your procedure, or until your health care provider says that this is okay. °· Take over-the-counter and prescription medicines only as told by your health care provider. °· Keep all follow-up visits as told by your health care provider. This is important. °Contact a health care provider if: °· You have less than 10 mL of drainage a day for 2-3 days in a row, or as directed by your health care provider. °· You have more redness, swelling, or pain around your incision area. °· You have more fluid or blood coming from your incision area. °· Your incision area feels warm to the touch. °· You have pus or a bad smell coming from your incision area. °· You have fluid leaking from around your catheter (instead of through your catheter). °· You have a fever or chills. °· You have pain that does not get better with medicine. °Get help right away if: °· Your catheter comes out. °· You suddenly stop having drainage from your catheter. °· You suddenly have blood in the fluid that is draining from your catheter. °· You become dizzy or you faint. °· You develop a rash. °· You have nausea or vomiting. °· You have difficulty breathing or you feel short of breath. °· You develop chest pain. °· You have problems with your speech or vision. °· You have trouble balancing or moving your arms or legs. °Summary °· It is common to have a small   amount of bruising and discomfort in the area where the drainage tube (catheter) was placed.  You may be directed to record the amount of drainage from the bag every time you empty it.  Follow instructions from your health care provider about emptying and cleaning your catheter and collection bag. This information is not intended to replace advice given to you by your health care provider. Make sure you discuss any questions you have with your health care provider. Document Released: 06/13/2013 Document  Revised: 12/20/2015 Document Reviewed: 12/20/2015 Elsevier Interactive Patient Education  2017 [place].    Follow with Primary MD  Biagio Borg, MD  and other consultant's as instructed your Hospitalist MD  Please get a complete blood count and chemistry panel checked by your Primary MD at your next visit, and again as instructed by your Primary MD.  Get Medicines reviewed and adjusted: Please take all your medications with you for your next visit with your Primary MD  Laboratory/radiological data: Please request your Primary MD to go over all hospital tests and procedure/radiological results at the follow up, please ask your Primary MD to get all Hospital records sent to his/her office.  In some cases, they will be blood work, cultures and biopsy results pending at the time of your discharge. Please request that your primary care M.D. follows up on these results.  Also Note the following: If you experience worsening of your admission symptoms, develop shortness of breath, life threatening emergency, suicidal or homicidal thoughts you must seek medical attention immediately by calling 911 or calling your MD immediately  if symptoms less severe.  You must read complete instructions/literature along with all the possible adverse reactions/side effects for all the Medicines you take and that have been prescribed to you. Take any new Medicines after you have completely understood and accpet all the possible adverse reactions/side effects.   Do not drive when taking Pain medications or sleeping medications (Benzodaizepines)  Do not take more than prescribed Pain, Sleep and Anxiety Medications. It is not advisable to combine anxiety,sleep and pain medications without talking with your primary care practitioner  Special Instructions: If you have smoked or chewed Tobacco  in the last 2 yrs please stop smoking, stop any regular Alcohol  and or any Recreational drug use.  Wear Seat belts  while driving.  Please note: You were cared for by a hospitalist during your hospital stay. Once you are discharged, your primary care physician will handle any further medical issues. Please note that NO REFILLS for any discharge medications will be authorized once you are discharged, as it is imperative that you return to your primary care physician (or establish a relationship with a primary care physician if you do not have one) for your post hospital discharge needs so that they can reassess your need for medications and monitor your lab values.

## 2016-08-02 NOTE — Discharge Planning (Signed)
Patient discharged home in stable condition. Verbalizes understanding of all discharge instructions, including home medications and follow up appointments. Husband was able to return demonstrate how to flush and empty drain, as well as change dressing around drain. Instructed pt and husband to record drainage and take record with them to MD appt.

## 2016-08-02 NOTE — Care Management Note (Signed)
Case Management Note  Patient Details  Name: [name] MRN: [id number] Date of Birth: 1960/02/17  Subjective/Objective:                 Spoke with patient. She would like to use [facility] for [facility] RN. No other needs identified.    Action/Plan:   Expected Discharge Date:  08/02/16               Expected Discharge Plan:  [facility]  In-House Referral:     Discharge planning Services  CM Consult  Post Acute Care Choice:    Choice offered to:  Patient  DME Arranged:    DME Agency:     HH Arranged:  RN [place] Agency:  [place]  Status of Service:  Completed, signed off  If discussed at [place] of Stay Meetings, dates discussed:    Additional Comments:  Carles Collet, RN 08/02/2016, 12:38 PM

## 2016-08-02 NOTE — Progress Notes (Signed)
Referring Physician(s): Dr Murvin Natal  Supervising Physician: Daryll Brod  Patient Status:  [facility] - In-pt  Chief Complaint:  Bladder wall abscess  Subjective:  Drain placed 6/19 Doing well Denies pain No N/V/F or chills Ambulating without assistance OP of drain blood tinged; 30 cc yesterday   Allergies: Morphine; Shrimp [shellfish allergy]; Tramadol; and Latex  Medications: Prior to Admission medications   Medication Sig Start Date End Date Taking? Authorizing Provider  albuterol (PROVENTIL HFA;VENTOLIN HFA) 108 (90 Base) MCG/ACT inhaler Inhale 1-2 puffs into the lungs every 6 (six) hours as needed for wheezing. 04/18/16  Yes Larene Pickett, PA-C  atorvastatin (LIPITOR) 10 MG tablet Take 1 tablet (10 mg total) by mouth daily. 12/07/15  Yes Biagio Borg, MD  Fluticasone-Salmeterol (ADVAIR DISKUS) 250-50 MCG/DOSE AEPB Inhale 1 puff into the lungs 2 (two) times daily. 04/07/16  Yes Biagio Borg, MD  HYDROcodone-acetaminophen (NORCO/VICODIN) 5-325 MG tablet Take 1 tablet by mouth 4 (four) times daily as needed. Patient taking differently: Take 1 tablet by mouth 4 (four) times daily as needed for moderate pain or severe pain.  07/25/16  Yes Biagio Borg, MD  ibuprofen (ADVIL,MOTRIN) 600 MG tablet Take 300 mg by mouth 2 (two) times daily as needed for moderate pain.   Yes [provider]  losartan-hydrochlorothiazide (HYZAAR) 100-25 MG tablet Take 1 tablet by mouth every morning. 12/07/15  Yes Biagio Borg, MD  Polyvinyl Alcohol-Povidone (CLEAR EYES ALL SEASONS) 5-6 MG/ML SOLN Place 2 drops into both eyes daily.   Yes [provider]  conjugated estrogens (PREMARIN) vaginal cream Place 1 Applicatorful vaginally daily. Patient not taking: Reported on 07/28/2016 07/25/16   Biagio Borg, MD  phenazopyridine (PYRIDIUM) 100 MG tablet Take 1 tablet (100 mg total) by mouth 3 (three) times daily as needed for pain. Patient not taking: Reported on 07/28/2016 07/25/16    Biagio Borg, MD     Vital Signs: BP (!) 143/92 (BP Location: Left Arm)   Pulse 88   Temp 98.1 F (36.7 C) (Oral)   Resp 19   Ht 5\' 1"  (1.549 m)   Wt (!) 304 lb 1.6 oz (137.9 kg)   SpO2 100%   BMI 57.46 kg/m   Physical Exam  Constitutional: She is oriented to person, place, and time.  Abdominal: Soft. Bowel sounds are normal. There is no tenderness.  Musculoskeletal: Normal range of motion.  Neurological: She is alert and oriented to person, place, and time.  Skin: Skin is warm and dry.  Site of drain is clean and dry NT no bleeding OP blood tinged serous fluid + Ecoli Cx 30 cc yesterday 10 cc in JP now   Psychiatric: She has a normal mood and affect. Her behavior is normal.  Nursing note and vitals reviewed.   Imaging: Ct Image Guided Drainage By Percutaneous Catheter  Result Date: 07/29/2016 INDICATION: Pelvic abscess at the dome of the bladder EXAM: CT GUIDED DRAINAGE OF PELVIC ABSCESS MEDICATIONS: The patient is currently admitted to the hospital and receiving intravenous antibiotics. The antibiotics were administered within an appropriate time frame prior to the initiation of the procedure. ANESTHESIA/SEDATION: Four mg IV Versed 200 mcg IV Fentanyl Moderate Sedation Time:  42 The patient was continuously monitored during the procedure by the interventional radiology nurse under my direct supervision. COMPLICATIONS: None immediate. TECHNIQUE: Informed written consent was obtained from the patient after a thorough discussion of the procedural risks, benefits and alternatives. All questions were addressed. Maximal Sterile  Barrier Technique was utilized including caps, mask, sterile gowns, sterile gloves, sterile drape, hand hygiene and skin antiseptic. A timeout was performed prior to the initiation of the procedure. PROCEDURE: The lower abdomen was prepped with ChloraPrep in a sterile fashion, and a sterile drape was applied covering the operative field. A sterile gown and  sterile gloves were used for the procedure. Local anesthesia was provided with 1% Lidocaine. Under CT guidance, an 18 gauge needle was advanced into the pelvic abscess at the dome of the bladder. Cloudy fluid was aspirated. An Amplatz wire was coiled in the fluid collection. 50 cc iodinated contrast was injected with delayed imaging of the bladder. This was performed to insure that the wire was coiled in the abscess and outside of the bladder. A 10 French dilator followed by a 10 [place] drain was inserted and coiled in the abscess cavity. 30 cc brown pus was aspirated. It was string fixed and sewn to the skin. FINDINGS: Imaging demonstrates needle placement and wire coiled within the a abscess at the dome of the bladder. The CT cystogram demonstrates contrast filling the bladder adjacent to the abscess cavity. The wire is coiled in the abscess cavity Final imaging demonstrates 10 French drain placement in the pelvic abscess. IMPRESSION: Successful 10 French pelvic abscess drainage yielding pus. Electronically Signed   By: Marybelle Killings M.D.   On: 07/29/2016 16:27    Labs:  CBC:  Recent Labs  07/29/16 0505 07/30/16 0642 07/31/16 0910 08/01/16 0652  WBC 16.6* 11.6* 11.6* 10.1  HGB 11.8* 11.5* 12.2 11.5*  HCT 37.4 36.7 39.3 37.6  PLT 317 315 350 352    COAGS:  Recent Labs  07/28/16 1400  INR 1.11  APTT 29    BMP:  Recent Labs  07/30/16 0642 07/31/16 0910 08/01/16 0652 08/02/16 0537  NA 137 143 142 142  K 2.9* 3.6 3.8 4.5  CL 102 106 109 110  CO2 30 28 26 24   GLUCOSE 155* 112* 102* 100*  BUN 10 10 9 11   CALCIUM 8.4* 9.1 8.8* 8.8*  CREATININE 1.06* 0.86 0.85 0.85  GFRNONAA 58* >60 >60 >60  GFRAA >60 >60 >60 >60    LIVER FUNCTION TESTS:  Recent Labs  07/28/16 0744 07/29/16 0505 07/30/16 0642 08/01/16 0652  BILITOT 0.5 0.5 0.1* 0.3  AST 29 24 25 24   ALT 21 18 18 19   ALKPHOS 89 85 73 68  PROT 6.9 6.5 6.1* 6.2*  ALBUMIN 3.1* 2.7* 2.6* 2.7*    Assessment and  Plan:  To be DC'd soon per pt. Home with drain Will need to flush 5 cc sterile saline daily (or at least every other day) May need [facility] RN IR drain f/u has been ordered---she will hear from scheduler for time and date   Electronically Signed: Olive Zmuda A, PA-C 08/02/2016, 11:42 AM   I spent a total of 25 Minutes at the the patient's bedside AND on the patient's hospital floor or unit, greater than 50% of which was counseling/coordinating care for bladder wall abscess drain

## 2016-08-03 NOTE — Progress Notes (Signed)
Date 08/03/2016 at 11:37:   Pt called to the 6N desk and had some questions following discharge.  I tried to contact her at [phone] (# given to nursing sec) x2 but it rang with no answer or answering machine message.

## 2016-08-03 NOTE — Progress Notes (Signed)
Date 08/03/2016 Time: 12:35 [place] regarding pt having questions regarding home care.  They have a visit scheduled for today and the operator will reach out to them to make sure that they make contact with the pt.

## 2016-08-04 ENCOUNTER — Other Ambulatory Visit: Payer: Self-pay | Admitting: General Surgery

## 2016-08-04 DIAGNOSIS — L0291 Cutaneous abscess, unspecified: Secondary | ICD-10-CM

## 2016-08-05 LAB — AEROBIC/ANAEROBIC CULTURE W GRAM STAIN (SURGICAL/DEEP WOUND)

## 2016-08-08 ENCOUNTER — Inpatient Hospital Stay: Payer: Commercial Managed Care - PPO | Admitting: Internal Medicine

## 2016-08-12 ENCOUNTER — Ambulatory Visit
Admission: RE | Admit: 2016-08-12 | Discharge: 2016-08-12 | Disposition: A | Discharge status: Home / Self Care | Payer: Commercial Managed Care - PPO | Source: Ambulatory Visit | Attending: General Surgery | Admitting: General Surgery

## 2016-08-12 ENCOUNTER — Ambulatory Visit
Admission: RE | Admit: 2016-08-12 | Discharge: 2016-08-12 | Disposition: A | Discharge status: Home / Self Care | Payer: Commercial Managed Care - PPO | Source: Ambulatory Visit | Attending: Student | Admitting: Student

## 2016-08-12 ENCOUNTER — Other Ambulatory Visit: Payer: Self-pay | Admitting: General Surgery

## 2016-08-12 ENCOUNTER — Other Ambulatory Visit (INDEPENDENT_AMBULATORY_CARE_PROVIDER_SITE_OTHER): Payer: Commercial Managed Care - PPO

## 2016-08-12 ENCOUNTER — Ambulatory Visit (INDEPENDENT_AMBULATORY_CARE_PROVIDER_SITE_OTHER): Payer: Commercial Managed Care - PPO | Admitting: Internal Medicine

## 2016-08-12 ENCOUNTER — Encounter: Payer: Self-pay | Admitting: Internal Medicine

## 2016-08-12 VITALS — BP 114/76 | HR 83 | Ht 61.0 in | Wt 283.0 lb

## 2016-08-12 DIAGNOSIS — N3080 Other cystitis without hematuria: Secondary | ICD-10-CM

## 2016-08-12 DIAGNOSIS — L0291 Cutaneous abscess, unspecified: Secondary | ICD-10-CM

## 2016-08-12 DIAGNOSIS — E876 Hypokalemia: Secondary | ICD-10-CM | POA: Diagnosis not present

## 2016-08-12 DIAGNOSIS — I10 Essential (primary) hypertension: Secondary | ICD-10-CM

## 2016-08-12 HISTORY — PX: IR RADIOLOGIST EVAL & MGMT: IMG5224

## 2016-08-12 LAB — CBC WITH DIFFERENTIAL/PLATELET
Basophils Absolute: 0.1 10*3/uL (ref 0.0–0.1)
Basophils Relative: 0.9 % (ref 0.0–3.0)
Eosinophils Absolute: 0.3 10*3/uL (ref 0.0–0.7)
Eosinophils Relative: 2.3 % (ref 0.0–5.0)
HCT: 43.2 % (ref 36.0–46.0)
Hemoglobin: 14.2 g/dL (ref 12.0–15.0)
Lymphocytes Relative: 18.7 % (ref 12.0–46.0)
Lymphs Abs: 2.4 10*3/uL (ref 0.7–4.0)
MCHC: 32.9 g/dL (ref 30.0–36.0)
MCV: 88.6 fl (ref 78.0–100.0)
Monocytes Absolute: 0.9 10*3/uL (ref 0.1–1.0)
Monocytes Relative: 6.7 % (ref 3.0–12.0)
Neutro Abs: 9.1 10*3/uL — ABNORMAL HIGH (ref 1.4–7.7)
Neutrophils Relative %: 71.4 % (ref 43.0–77.0)
Platelets: 341 10*3/uL (ref 150.0–400.0)
RBC: 4.87 Mil/uL (ref 3.87–5.11)
RDW: 14.2 % (ref 11.5–15.5)
WBC: 12.8 10*3/uL — ABNORMAL HIGH (ref 4.0–10.5)

## 2016-08-12 LAB — BASIC METABOLIC PANEL
BUN: 15 mg/dL (ref 6–23)
CO2: 31 mEq/L (ref 19–32)
Calcium: 10.1 mg/dL (ref 8.4–10.5)
Chloride: 100 mEq/L (ref 96–112)
Creatinine, Ser: 0.96 mg/dL (ref 0.40–1.20)
GFR: 77.49 mL/min (ref >60.00)
Glucose, Bld: 97 mg/dL (ref 70–99)
Potassium: 3.9 mEq/L (ref 3.5–5.1)
Sodium: 139 mEq/L (ref 135–145)

## 2016-08-12 LAB — HEPATIC FUNCTION PANEL
ALT: 21 U/L (ref 0–35)
AST: 21 U/L (ref 0–37)
Albumin: 4.1 g/dL (ref 3.5–5.2)
Alkaline Phosphatase: 96 U/L (ref 39–117)
Bilirubin, Direct: 0.2 mg/dL (ref 0.0–0.3)
Total Bilirubin: 0.8 mg/dL (ref 0.2–1.2)
Total Protein: 8.1 g/dL (ref 6.0–8.3)

## 2016-08-12 MED ORDER — IOPAMIDOL (ISOVUE-300) INJECTION 61%
125.0000 mL | Freq: Once | INTRAVENOUS | Status: AC | PRN
  Administered 2016-08-12: 125 mL via INTRAVENOUS

## 2016-08-12 NOTE — Patient Instructions (Addendum)

## 2016-08-12 NOTE — Progress Notes (Addendum)
Chief Complaint: Patient was seen in follow-up today for abscess drain care at the request of Docia Barrier  Referring Physician(s): Leighton Ruff, MD [facility])  History of Present Illness: [name] is a 56 y.o. female  56 year old female who initially presented in June of 4270 with complicated diverticulitis in an abscess in the region of the bladder dome with possible involvement the left ovary. The patient was initially seen by General surgery (Dr. Georgette Dover) and Urology (Dr. Tresa Moore). A multi disciplinary decision to  proceed with conservative management was made and the patient was treated with percutaneous drain placement on 07/29/2016.  The patient presents today for their initial follow-up evaluation.  Her drain output has been minimal, no more than 5-10 cc of cloudy fluid daily. Her drain is currently maintained on JP bulb suction and she is flushing the drain twice daily.  She denies fever, chills, abdominal pain, or diarrhea. She does have intermittent constipation. She also denies continued dysuria.  Past Medical History:  Diagnosis Date  . Alcohol abuse    abuse- moderate years ago - states only drinks now on weekends -2 to 8 driinks   . Asthma   . COLONIC POLYPS, HX OF 04/05/2010  . DIVERTICULITIS, HX OF 04/05/2010  . DJD (degenerative joint disease)    right knee, mot to severe  . GERD (gastroesophageal reflux disease)    no meds  . Heart murmur    hx of   . Hyperlipidemia   . Hypertension   . Impaired glucose tolerance 12/06/2013  . Obesity (BMI 30-39.9)   . PALPITATIONS, HX OF 09/14/2007    Past Surgical History:  Procedure Laterality Date  . ABDOMINAL HYSTERECTOMY    . COLONOSCOPY WITH PROPOFOL N/A 07/25/2016   Procedure: COLONOSCOPY WITH PROPOFOL;  Surgeon: Carol Ada, MD;  Location: [facility];  Service: Endoscopy;  Laterality: N/A;  . colonscopy     x 2  . KNEE ARTHROSCOPY     left   . TOTAL KNEE ARTHROPLASTY   07/29/2011   Procedure: TOTAL KNEE ARTHROPLASTY;  Surgeon: Mauri Pole, MD;  Location: [facility];  Service: Orthopedics;  Laterality: Right;  . TOTAL KNEE ARTHROPLASTY Left 12/14/2012   Procedure: LEFT TOTAL KNEE ARTHROPLASTY;  Surgeon: Mauri Pole, MD;  Location: [facility];  Service: Orthopedics;  Laterality: Left;  Marland Kitchen VESICOVAGINAL FISTULA CLOSURE W/ TAH      Allergies: Morphine; Shrimp [shellfish allergy]; Tramadol; and Latex  Medications: Prior to Admission medications   Medication Sig Start Date End Date Taking? Authorizing Provider  albuterol (PROVENTIL HFA;VENTOLIN HFA) 108 (90 Base) MCG/ACT inhaler Inhale 1-2 puffs into the lungs every 6 (six) hours as needed for wheezing. 04/18/16   Larene Pickett, PA-C  atorvastatin (LIPITOR) 10 MG tablet Take 1 tablet (10 mg total) by mouth daily. 12/07/15   Biagio Borg, MD  cefpodoxime (VANTIN) 200 MG tablet Take 1 tablet (200 mg total) by mouth every 12 (twelve) hours. 08/02/16 08/12/16  Murlean Iba, MD  conjugated estrogens (PREMARIN) vaginal cream Place 1 Applicatorful vaginally daily. Patient not taking: Reported on 07/28/2016 07/25/16   Biagio Borg, MD  Fluticasone-Salmeterol (ADVAIR DISKUS) 250-50 MCG/DOSE AEPB Inhale 1 puff into the lungs 2 (two) times daily. 04/07/16   Biagio Borg, MD  HYDROcodone-acetaminophen (NORCO/VICODIN) 5-325 MG tablet Take 1 tablet by mouth 4 (four) times daily as needed for moderate pain or severe pain. 08/02/16   Johnson, Clanford L, MD  ibuprofen (ADVIL,MOTRIN) 600 MG tablet  Take 300 mg by mouth 2 (two) times daily as needed for moderate pain.    [provider]  losartan-hydrochlorothiazide (HYZAAR) 100-25 MG tablet Take 1 tablet by mouth every morning. 12/07/15   Biagio Borg, MD  phenazopyridine (PYRIDIUM) 100 MG tablet Take 1 tablet (100 mg total) by mouth 3 (three) times daily as needed for pain. Patient not taking: Reported on 07/28/2016 07/25/16   Biagio Borg, MD  Polyvinyl Alcohol-Povidone  (CLEAR EYES ALL SEASONS) 5-6 MG/ML SOLN Place 2 drops into both eyes daily.    [provider]     Family History  Problem Relation Age of Onset  . Stroke Mother   . COPD Father   . Lymphoma Sister     Social History   Social History  . Marital status: Married    Spouse name: N/A  . Number of children: 1  . Years of education: N/A   Occupational History  . PACKER [employer]   Social History Main Topics  . Smoking status: Former Smoker    Packs/day: 0.50    Years: 7.00    Types: Cigarettes    Quit date: 02/11/1988  . Smokeless tobacco: Never Used  . Alcohol use No  . Drug use: No     Comment: smoked marajuania x 10 years.  Quit in 1985.  Marland Kitchen Sexual activity: Yes    Birth control/ protection: Surgical   Other Topics Concern  . Not on file   Social History Narrative   Environmental consultant on file.  [name] April 05, 2010 12:04 PM     Review of Systems: A 12 point ROS discussed and pertinent positives are indicated in the HPI above.  All other systems are negative.  Review of Systems  Vital Signs: BP (!) 149/80   Pulse 99   Temp 97.9 F (36.6 C)   SpO2 100%   Physical Exam  Constitutional: She is oriented to person, place, and time. She appears well-developed and well-nourished. No distress.  HENT:  Head: Normocephalic and atraumatic.  Eyes: No scleral icterus.  Cardiovascular: Regular rhythm.   Pulmonary/Chest: Effort normal.  Abdominal: Soft. She exhibits no distension. There is no tenderness.    Neurological: She is alert and oriented to person, place, and time.  Skin: Skin is warm and dry.  Psychiatric: She has a normal mood and affect. Her behavior is normal.  Nursing note and vitals reviewed.    Imaging: Ct Abdomen Pelvis W Contrast  Result Date: 08/12/2016 CLINICAL DATA:  56 year old female who initially presented in June of 0932 with complicated diverticulitis in an abscess in the region of the bladder dome with possible  involvement the left ovary. The patient was initially seen by General surgery (Dr. Georgette Dover) and Urology (Dr. Tresa Moore). A multi disciplinary decision to proceed with conservative management was made and the patient was treated with percutaneous drain placement on 07/29/2016. The patient presents today for their initial follow-up evaluation. Her drain output has been minimal, no more than 5-10 cc of cloudy fluid daily. Her drain is currently maintained on JP bulb suction and she is flushing the drained twice daily. EXAM: CT ABDOMEN AND PELVIS WITH CONTRAST TECHNIQUE: Multidetector CT imaging of the abdomen and pelvis was performed using the standard protocol following bolus administration of intravenous contrast. CONTRAST:  186mL ISOVUE-300 IOPAMIDOL (ISOVUE-300) INJECTION 61% COMPARISON:  Prior CT abdomen/ pelvis 07/28/2016 ; MRI of the abdomen 02/07/2005 FINDINGS: Lower chest: The lung bases are clear. Visualized cardiac structures are within  normal limits for size. No pericardial effusion. Unremarkable visualized distal thoracic esophagus. Hepatobiliary: Hypoechoic lesion in hepatic dome with some evidence of peripheral nodular enhancement is consistent with a benign hemangioma as noted on multiple prior imaging studies. No new lesion identified. Gallbladder is unremarkable. No intra or extrahepatic biliary ductal dilatation. Pancreas: Unremarkable. No pancreatic ductal dilatation or surrounding inflammatory changes. Spleen: Normal in size without focal abnormality. Adrenals/Urinary Tract: Adrenal glands are unremarkable. Kidneys are normal, without renal calculi, focal lesion, or hydronephrosis. Bladder is unremarkable. Stomach/Bowel: Stomach is within normal limits. Appendix appears normal. No evidence of bowel wall thickening, distention, or inflammatory changes. Interval resolution of inflammatory changes surrounding the sigmoid colon. Vascular/Lymphatic: Trace atherosclerotic calcifications in the abdominal aorta.  No aneurysm. No suspicious adenopathy. Reproductive: Status post hysterectomy. No adnexal masses. Other: No abdominal wall hernia or abnormality. No abdominopelvic ascites. Well-positioned percutaneous drainage catheter. The previously noted abscess in the bladder dome is no longer evident. Persistent phlegmonous or fibrous soft tissue tract extending from the region of the drain to the adjacent sigmoid colon. Musculoskeletal: No acute fracture or aggressive appearing lytic or blastic osseous lesion. L5-S1 degenerative disc disease. IMPRESSION: 1. Interval resolution of bladder dome abscess. The percutaneous drainage catheter remains well positioned. 2. Persistent, but less conspicuous phlegmonous or fibrous soft tissue tract extending from the region of the drain to the adjacent sigmoid colon. This may represent a persistent fistulous communication and will be further evaluated by drain injection under fluoroscopy. 3. Additional ancillary findings as above without significant interval change. Signed, Criselda Peaches, MD [facility] Electronically Signed   By: Jacqulynn Cadet M.D.   On: 08/12/2016 10:34   Ct Abdomen Pelvis W Contrast  Result Date: 07/28/2016 CLINICAL DATA:  Back pain, dysuria, leukocytosis EXAM: CT ABDOMEN AND PELVIS WITH CONTRAST TECHNIQUE: Multidetector CT imaging of the abdomen and pelvis was performed using the standard protocol following bolus administration of intravenous contrast. CONTRAST:  155mL ISOVUE-300 IOPAMIDOL (ISOVUE-300) INJECTION 61% COMPARISON:  01/09/2011 FINDINGS: Lower chest: Lung bases are clear. Hepatobiliary: 3.5 cm hemangioma along the right hepatic dome (series 3/ image 13), incompletely visualized. Additional smaller lesions are poorly evaluated. Gallbladder is unremarkable. No intrahepatic or extrahepatic ductal dilatation. Pancreas: Within normal limits. Spleen: Within normal limits. Adrenals/Urinary  Tract: Adrenal glands are within normal limits. Right kidney is within normal limits. Two nonobstructing left lower pole renal calculi measuring up to 5 mm (coronal image 68). No ureteral or bladder calculi. No hydronephrosis. Bladder is notable for a 9.8 x 5.4 x 4.5 cm intramural abscess along the left superior bladder dome (series 3/ image 68; coronal image 65). Stomach/Bowel: Stomach is within normal limits. No evidence of bowel obstruction. Normal appendix (series 3/ image 60). Suspected fistula between the sigmoid colon and the intramural bladder abscess (coronal image 64). Vascular/Lymphatic: No evidence of abdominal aortic aneurysm. Atherosclerotic calcifications of the abdominal aorta and branch vessels. Small retroperitoneal lymph nodes which do not meet pathologic CT size criteria. Reproductive: Status post hysterectomy. Right ovary is within normal limits. Intramural bladder abscess is difficult to separate from the left ovary (series 3/image 64). Other: No abdominopelvic ascites. No free air. Musculoskeletal: Degenerative changes of the visualized thoracolumbar spine. IMPRESSION: 9.8 x 5.4 x 4.5 cm intramural abscess along the left superior bladder dome. Associated fistulous communication with the sigmoid colon. Possible involvement of the left ovary. No ascites or free air. Electronically Signed   By: Julian Hy M.D.   On: 07/28/2016 09:06  Dg Sinus/fist Tube Chk-non Gi  Result Date: 08/12/2016 INDICATION: 56 year old female who initially presented in June of 2585 with complicated diverticulitis in an abscess in the region of the bladder dome with possible involvement the left ovary. The patient was initially seen by General surgery (Dr. Georgette Dover) and Urology (Dr. Tresa Moore). A multi disciplinary decision to proceed with conservative management was made and the patient was treated with percutaneous drain placement on 07/29/2016. The patient presents today for their initial follow-up evaluation. Her  drain output has been minimal, no more than 5-10 cc of cloudy fluid daily. Her drain is currently maintained on JP bulb suction and she is flushing the drained twice daily. CT imaging demonstrates no significant residual abscess but a possible persistent fistulous communication with the adjacent sigmoid colon. EXAM: Drain injection under fluoroscopy MEDICATIONS: None. ANESTHESIA/SEDATION: None. COMPLICATIONS: None immediate. PROCEDURE: A gentle hand injection of contrast material was performed. There is a persistent fistulous communication with the adjacent sigmoid colon. The abscess cavity is largely collapsed. The bladder is partially opacified with excreted contrast material from the earlier contrast-enhanced CT scan. IMPRESSION: Persistent fistulous communication with the adjacent sigmoid colon. PLAN: 1. Change from JP bulb suction to gravity bag drainage. 2. Cease further forward flushing. 3. Return to Interventional radiology drain clinic in 1- 2 weeks for repeat drain injection. Signed, Criselda Peaches, MD [facility] Rad[facility] Electronically Signed   By: Jacqulynn Cadet M.D.   On: 08/12/2016 10:40   Ct Image Guided Drainage By Percutaneous Catheter  Result Date: 07/29/2016 INDICATION: Pelvic abscess at the dome of the bladder EXAM: CT GUIDED DRAINAGE OF PELVIC ABSCESS MEDICATIONS: The patient is currently admitted to the hospital and receiving intravenous antibiotics. The antibiotics were administered within an appropriate time frame prior to the initiation of the procedure. ANESTHESIA/SEDATION: Four mg IV Versed 200 mcg IV Fentanyl Moderate Sedation Time:  42 The patient was continuously monitored during the procedure by the interventional radiology nurse under my direct supervision. COMPLICATIONS: None immediate. TECHNIQUE: Informed written consent was obtained from the patient after a thorough discussion of the procedural risks, benefits and  alternatives. All questions were addressed. Maximal Sterile Barrier Technique was utilized including caps, mask, sterile gowns, sterile gloves, sterile drape, hand hygiene and skin antiseptic. A timeout was performed prior to the initiation of the procedure. PROCEDURE: The lower abdomen was prepped with ChloraPrep in a sterile fashion, and a sterile drape was applied covering the operative field. A sterile gown and sterile gloves were used for the procedure. Local anesthesia was provided with 1% Lidocaine. Under CT guidance, an 18 gauge needle was advanced into the pelvic abscess at the dome of the bladder. Cloudy fluid was aspirated. An Amplatz wire was coiled in the fluid collection. 50 cc iodinated contrast was injected with delayed imaging of the bladder. This was performed to insure that the wire was coiled in the abscess and outside of the bladder. A 10 French dilator followed by a 10 [place] drain was inserted and coiled in the abscess cavity. 30 cc brown pus was aspirated. It was string fixed and sewn to the skin. FINDINGS: Imaging demonstrates needle placement and wire coiled within the a abscess at the dome of the bladder. The CT cystogram demonstrates contrast filling the bladder adjacent to the abscess cavity. The wire is coiled in the abscess cavity Final imaging demonstrates 10 French drain placement in the pelvic abscess. IMPRESSION: Successful 10 French pelvic abscess drainage yielding pus. Electronically Signed  By: Marybelle Killings M.D.   On: 07/29/2016 16:27    Labs:  CBC:  Recent Labs  07/29/16 0505 07/30/16 0642 07/31/16 0910 08/01/16 0652  WBC 16.6* 11.6* 11.6* 10.1  HGB 11.8* 11.5* 12.2 11.5*  HCT 37.4 36.7 39.3 37.6  PLT 317 315 350 352    COAGS:  Recent Labs  07/28/16 1400  INR 1.11  APTT 29    BMP:  Recent Labs  07/30/16 0642 07/31/16 0910 08/01/16 0652 08/02/16 0537  NA 137 143 142 142  K 2.9* 3.6 3.8 4.5  CL 102 106 109 110  CO2 30 28 26 24   GLUCOSE  155* 112* 102* 100*  BUN 10 10 9 11   CALCIUM 8.4* 9.1 8.8* 8.8*  CREATININE 1.06* 0.86 0.85 0.85  GFRNONAA 58* >60 >60 >60  GFRAA >60 >60 >60 >60    LIVER FUNCTION TESTS:  Recent Labs  07/28/16 0744 07/29/16 0505 07/30/16 0642 08/01/16 0652  BILITOT 0.5 0.5 0.1* 0.3  AST 29 24 25 24   ALT 21 18 18 19   ALKPHOS 89 85 73 68  PROT 6.9 6.5 6.1* 6.2*  ALBUMIN 3.1* 2.7* 2.6* 2.7*    TUMOR MARKERS: No results for input(s): AFPTM, CEA, CA199, CHROMGRNA in the last 8760 hours.  Assessment and Plan:  Resolution of the abscess cavity on CT imaging that there is a persistent fistulous communication with the sigmoid colon on drain injection.  1.) Convert from JP bulb suction to gravity bag for drainage 2.) Stop forward flushing drain. 3.) Repeat drain injection in 1-2 weeks.    Electronically Signed: Jacqulynn Cadet 08/12/2016, 11:33 AM   I spent a total of  15 Minutes in face to face in clinical consultation, greater than 50% of which was counseling/coordinating care for diverticular abscess.

## 2016-08-12 NOTE — Progress Notes (Signed)
Subjective:    Patient ID: [name], female    DOB: October 26, 1960, 56 y.o.   MRN: [id number]  HPI    Here to f/u recent hospn 6/18-6/23 for low abd pain with the findings of bladder abscess after ED eval with leukocytosis and CT with abscess and fistula communication with sigmoid colon; seen per urology and gen surgury, tx with IV antibx with clinical improvement.  Fistula felt to be small and may not require surgical intervention.  Recommendations for Outpatient Follow-up:  1. Follow up with PCP in 1 weeks 2. Follow up with IR drain clinic in 1-2 weeks 3. Follow up with urology Dr. Jeffie Pollock in 1-2 months for cystocopy Please obtain BMP/CBC in one week  Small fistula seen on f/u evalaution earlier today per pt, for tube to now remain for 1-2 more wks.  Overall has lost 20 lbs with her illness and lower appetite.  Denies worsening reflux, worsening abd pain, dysphagia, n/v, bowel change or blood.  No dysuria, fever, chills.   Wt Readings from Last 3 Encounters:  08/12/16 283 lb (128.4 kg)  08/02/16 (!) 304 lb 1.6 oz (137.9 kg)  07/25/16 293 lb (132.9 kg)  No other new complaints Past Medical History:  Diagnosis Date  . Alcohol abuse    abuse- moderate years ago - states only drinks now on weekends -2 to 8 driinks   . Asthma   . COLONIC POLYPS, HX OF 04/05/2010  . DIVERTICULITIS, HX OF 04/05/2010  . DJD (degenerative joint disease)    right knee, mot to severe  . GERD (gastroesophageal reflux disease)    no meds  . Heart murmur    hx of   . Hyperlipidemia   . Hypertension   . Impaired glucose tolerance 12/06/2013  . Obesity (BMI 30-39.9)   . PALPITATIONS, HX OF 09/14/2007   Past Surgical History:  Procedure Laterality Date  . ABDOMINAL HYSTERECTOMY    . COLONOSCOPY WITH PROPOFOL N/A 07/25/2016   Procedure: COLONOSCOPY WITH PROPOFOL;  Surgeon: Carol Ada, MD;  Location[facility];  Service: Endoscopy;  Laterality: N/A;  . colonscopy     x 2  . KNEE ARTHROSCOPY     left     . TOTAL KNEE ARTHROPLASTY  07/29/2011   Procedure: TOTAL KNEE ARTHROPLASTY;  Surgeon: Mauri Pole, MD;  Location: [facility];  Service: Orthopedics;  Laterality: Right;  . TOTAL KNEE ARTHROPLASTY Left 12/14/2012   Procedure: LEFT TOTAL KNEE ARTHROPLASTY;  Surgeon: Mauri Pole, MD;  Location: [facility];  Service: Orthopedics;  Laterality: Left;  Marland Kitchen VESICOVAGINAL FISTULA CLOSURE W/ TAH      reports that she quit smoking about 28 years ago. Her smoking use included Cigarettes. She has a 3.50 pack-year smoking history. She has never used smokeless tobacco. She reports that she does not drink alcohol or use drugs. family history includes COPD in her father; Lymphoma in her sister; Stroke in her mother. Allergies  Allergen Reactions  . Morphine Itching  . Shrimp [Shellfish Allergy] Itching    Tongue burns  . Tramadol Other (See Comments)    confusion  . Latex Rash   Current Outpatient Prescriptions on File Prior to Visit  Medication Sig Dispense Refill  . albuterol (PROVENTIL HFA;VENTOLIN HFA) 108 (90 Base) MCG/ACT inhaler Inhale 1-2 puffs into the lungs every 6 (six) hours as needed for wheezing. 1 Inhaler 0  . atorvastatin (LIPITOR) 10 MG tablet Take 1 tablet (10 mg total) by mouth daily. 90 tablet 3  .  Fluticasone-Salmeterol (ADVAIR DISKUS) 250-50 MCG/DOSE AEPB Inhale 1 puff into the lungs 2 (two) times daily. 3 each 2  . losartan-hydrochlorothiazide (HYZAAR) 100-25 MG tablet Take 1 tablet by mouth every morning. 90 tablet 3   No current facility-administered medications on file prior to visit.    Review of Systems  Constitutional: Negative for other unusual diaphoresis or sweats HENT: Negative for ear discharge or swelling Eyes: Negative for other worsening visual disturbances Respiratory: Negative for stridor or other swelling  Gastrointestinal: Negative for worsening distension or other blood Genitourinary: Negative for retention or other urinary change Musculoskeletal: Negative for  other MSK pain or swelling Skin: Negative for color change or other new lesions Neurological: Negative for worsening tremors and other numbness  Psychiatric/Behavioral: Negative for worsening agitation or other fatigue All other system neg per pt    Objective:   Physical Exam BP 114/76   Pulse 83   Ht 5\' 1"  (1.549 m)   Wt 283 lb (128.4 kg)   SpO2 98%   BMI 53.47 kg/m  VS noted, not ill appearing Constitutional: Pt appears in NAD HENT: Head: NCAT.  Right Ear: External ear normal.  Left Ear: External ear normal.  Eyes: . Pupils are equal, round, and reactive to light. Conjunctivae and EOM are normal Nose: without d/c or deformity Neck: Neck supple. Gross normal ROM Cardiovascular: Normal rate and regular rhythm.   Pulmonary/Chest: Effort normal and breath sounds without rales or wheezing.  Abd:  Soft, NT, ND, + BS, no organomegaly, no flank tender, drain present low mid abd Neurological: Pt is alert. At baseline orientation, motor grossly intact Skin: Skin is warm. No rashes, other new lesions, no LE edema Psychiatric: Pt behavior is normal without agitation  No other exam findings Urine culture  Order: [id number]  Status:  Final result Visible to patient:  Yes (MyChart) Next appt:  08/21/2016 at 09:00 AM in Radiology ([facility])  Newer results are available. Click to view them now.   2wk ago  Specimen Description URINE, CLEAN CATCH   Special Requests NONE   Culture MULTIPLE SPECIES PRESENT, SUGGEST RECOLLECTION    Report Status 07/29/2016 FINAL   Resulting Agency [facility]    Specimen Collected: 07/28/16 05:39 Last Resulted: 07/29/16 11:15         Lab Results  Component Value Date   WBC 12.8 (H) 08/12/2016   HGB 14.2 08/12/2016   HCT 43.2 08/12/2016   PLT 341.0 08/12/2016   GLUCOSE 97 08/12/2016   CHOL 208 (H) 04/17/2016   TRIG 83.0 04/17/2016   HDL 76.70 04/17/2016   LDLDIRECT 107.0 02/23/2012   LDLCALC 115 (H) 04/17/2016   ALT 21 08/12/2016   AST 21  08/12/2016   NA 139 08/12/2016   K 3.9 08/12/2016   CL 100 08/12/2016   CREATININE 0.96 08/12/2016   BUN 15 08/12/2016   CO2 31 08/12/2016   TSH 2.35 04/17/2016   INR 1.11 07/28/2016   HGBA1C 5.9 04/17/2016        Assessment & Plan:

## 2016-08-13 NOTE — Assessment & Plan Note (Signed)
Mild elevated, likely situational, o/w stable overall by history and exam, recent data reviewed with pt, and pt to continue medical treatment as before,  to f/u any worsening symptoms or concerns BP Readings from Last 3 Encounters:  08/12/16 (!) 149/80  08/12/16 114/76  08/02/16 (!) 143/92

## 2016-08-13 NOTE — Assessment & Plan Note (Signed)
Replaced at last hospn, for f/u lab today

## 2016-08-13 NOTE — Assessment & Plan Note (Signed)
Clinically improving, for f/u IR for possible drain removal as planned, cont same tx

## 2016-08-14 ENCOUNTER — Encounter: Payer: Self-pay | Admitting: Internal Medicine

## 2016-08-14 ENCOUNTER — Telehealth: Payer: Self-pay | Admitting: Internal Medicine

## 2016-08-14 NOTE — Telephone Encounter (Signed)
Pt would like a call back regarding her lab results  She is worried about her WBC

## 2016-08-14 NOTE — Telephone Encounter (Signed)
Returned pt call, received a busy signal, no ringing. Tried 2 additional times and still received a busy signal.

## 2016-08-18 ENCOUNTER — Telehealth: Payer: Self-pay | Admitting: Internal Medicine

## 2016-08-18 NOTE — Telephone Encounter (Signed)
Patient has appointment on Thursday with Dr. Jenny Reichmann.  States she gave Dr. Jenny Reichmann FMLA forms on 7/3 to be completed.  I do not see a note in the system about this.  Patient would like to have this ready if possible for her next visit.

## 2016-08-19 DIAGNOSIS — Z4803 Encounter for change or removal of drains: Secondary | ICD-10-CM | POA: Diagnosis not present

## 2016-08-19 DIAGNOSIS — N3080 Other cystitis without hematuria: Secondary | ICD-10-CM | POA: Diagnosis not present

## 2016-08-19 DIAGNOSIS — K5730 Diverticulosis of large intestine without perforation or abscess without bleeding: Secondary | ICD-10-CM | POA: Diagnosis not present

## 2016-08-19 DIAGNOSIS — B9620 Unspecified Escherichia coli [E. coli] as the cause of diseases classified elsewhere: Secondary | ICD-10-CM | POA: Diagnosis not present

## 2016-08-19 DIAGNOSIS — Z1624 Resistance to multiple antibiotics: Secondary | ICD-10-CM | POA: Diagnosis not present

## 2016-08-19 DIAGNOSIS — I10 Essential (primary) hypertension: Secondary | ICD-10-CM | POA: Diagnosis not present

## 2016-08-19 DIAGNOSIS — E785 Hyperlipidemia, unspecified: Secondary | ICD-10-CM

## 2016-08-19 DIAGNOSIS — J45909 Unspecified asthma, uncomplicated: Secondary | ICD-10-CM | POA: Diagnosis not present

## 2016-08-19 DIAGNOSIS — Z0279 Encounter for issue of other medical certificate: Secondary | ICD-10-CM

## 2016-08-19 NOTE — Telephone Encounter (Signed)
Dr. Jenny Reichmann do you know anything about FMLA for this patient? I do not have any paperwork at my desk for her.

## 2016-08-19 NOTE — Telephone Encounter (Signed)
Form given this am

## 2016-08-21 ENCOUNTER — Ambulatory Visit
Admission: RE | Admit: 2016-08-21 | Discharge: 2016-08-21 | Disposition: A | Discharge status: Home / Self Care | Payer: Commercial Managed Care - PPO | Source: Ambulatory Visit | Attending: General Surgery | Admitting: General Surgery

## 2016-08-21 ENCOUNTER — Other Ambulatory Visit (INDEPENDENT_AMBULATORY_CARE_PROVIDER_SITE_OTHER): Payer: Commercial Managed Care - PPO

## 2016-08-21 ENCOUNTER — Encounter: Payer: Self-pay | Admitting: Internal Medicine

## 2016-08-21 ENCOUNTER — Ambulatory Visit (INDEPENDENT_AMBULATORY_CARE_PROVIDER_SITE_OTHER): Payer: Commercial Managed Care - PPO | Admitting: Internal Medicine

## 2016-08-21 VITALS — BP 136/86 | HR 85 | Ht 61.0 in | Wt 283.0 lb

## 2016-08-21 DIAGNOSIS — R7302 Impaired glucose tolerance (oral): Secondary | ICD-10-CM | POA: Diagnosis not present

## 2016-08-21 DIAGNOSIS — I10 Essential (primary) hypertension: Secondary | ICD-10-CM

## 2016-08-21 DIAGNOSIS — L0291 Cutaneous abscess, unspecified: Secondary | ICD-10-CM

## 2016-08-21 DIAGNOSIS — E785 Hyperlipidemia, unspecified: Secondary | ICD-10-CM

## 2016-08-21 DIAGNOSIS — N3080 Other cystitis without hematuria: Secondary | ICD-10-CM | POA: Diagnosis not present

## 2016-08-21 HISTORY — PX: IR RADIOLOGIST EVAL & MGMT: IMG5224

## 2016-08-21 LAB — CBC WITH DIFFERENTIAL/PLATELET
Basophils Absolute: 0.1 10*3/uL (ref 0.0–0.1)
Basophils Relative: 0.8 % (ref 0.0–3.0)
Eosinophils Absolute: 0.3 10*3/uL (ref 0.0–0.7)
Eosinophils Relative: 3.3 % (ref 0.0–5.0)
HCT: 39.6 % (ref 36.0–46.0)
Hemoglobin: 13.1 g/dL (ref 12.0–15.0)
Lymphocytes Relative: 23.4 % (ref 12.0–46.0)
Lymphs Abs: 2.4 10*3/uL (ref 0.7–4.0)
MCHC: 33 g/dL (ref 30.0–36.0)
MCV: 89.6 fl (ref 78.0–100.0)
Monocytes Absolute: 0.9 10*3/uL (ref 0.1–1.0)
Monocytes Relative: 9 % (ref 3.0–12.0)
Neutro Abs: 6.6 10*3/uL (ref 1.4–7.7)
Neutrophils Relative %: 63.5 % (ref 43.0–77.0)
Platelets: 220 10*3/uL (ref 150.0–400.0)
RBC: 4.43 Mil/uL (ref 3.87–5.11)
RDW: 14.8 % (ref 11.5–15.5)
WBC: 10.3 10*3/uL (ref 4.0–10.5)

## 2016-08-21 NOTE — Patient Instructions (Addendum)
Please continue all other medications as before, and refills have been done if requested.  Please have the pharmacy call with any other refills you may need.  Please continue your efforts at being more active, low cholesterol diet, and weight control.  Please keep your appointments with your specialists as you may have planned  You are given the work note to return to work today  Please go to the LAB in the Basement (turn left off the elevator) for the tests to be done today  You will be contacted by phone if any changes need to be made immediately.  Otherwise, you will receive a letter about your results with an explanation, but please check with MyChart first.  Please remember to sign up for MyChart if you have not done so, as this will be important to you in the future with finding out test results, communicating by private email, and scheduling acute appointments online when needed.

## 2016-08-21 NOTE — Progress Notes (Signed)
Chief Complaint: Diverticular abscess with fistula to sigmoid colon.  History of Present Illness: [name] is a 56 y.o. female status post percutaneous drainage of a diverticular abscess located superior to the dome of the bladder on 07/29/2016. Injection of the drain under fluoroscopy on 08/12/2016 demonstrated a fistula to the sigmoid colon. Since that time, the drain has been connected to a gravity bag and is not being flushed. She has noted no significant output from the drain since prior evaluation.  Past Medical History:  Diagnosis Date  . Alcohol abuse    abuse- moderate years ago - states only drinks now on weekends -2 to 8 driinks   . Asthma   . COLONIC POLYPS, HX OF 04/05/2010  . DIVERTICULITIS, HX OF 04/05/2010  . DJD (degenerative joint disease)    right knee, mot to severe  . GERD (gastroesophageal reflux disease)    no meds  . Heart murmur    hx of   . Hyperlipidemia   . Hypertension   . Impaired glucose tolerance 12/06/2013  . Obesity (BMI 30-39.9)   . PALPITATIONS, HX OF 09/14/2007    Past Surgical History:  Procedure Laterality Date  . ABDOMINAL HYSTERECTOMY    . COLONOSCOPY WITH PROPOFOL N/A 07/25/2016   Procedure: COLONOSCOPY WITH PROPOFOL;  Surgeon: Carol Ada, MD;  Location: [facility];  Service: Endoscopy;  Laterality: N/A;  . colonscopy     x 2  . KNEE ARTHROSCOPY     left   . TOTAL KNEE ARTHROPLASTY  07/29/2011   Procedure: TOTAL KNEE ARTHROPLASTY;  Surgeon: Mauri Pole, MD;  Location: [facility];  Service: Orthopedics;  Laterality: Right;  . TOTAL KNEE ARTHROPLASTY Left 12/14/2012   Procedure: LEFT TOTAL KNEE ARTHROPLASTY;  Surgeon: Mauri Pole, MD;  Location: [facility];  Service: Orthopedics;  Laterality: Left;  Marland Kitchen VESICOVAGINAL FISTULA CLOSURE W/ TAH      Allergies: Morphine; Shrimp [shellfish allergy]; Tramadol; and Latex  Medications: Prior to Admission medications   Medication Sig Start Date End Date Taking? Authorizing Provider    albuterol (PROVENTIL HFA;VENTOLIN HFA) 108 (90 Base) MCG/ACT inhaler Inhale 1-2 puffs into the lungs every 6 (six) hours as needed for wheezing. 04/18/16   Larene Pickett, PA-C  atorvastatin (LIPITOR) 10 MG tablet Take 1 tablet (10 mg total) by mouth daily. 12/07/15   Biagio Borg, MD  Fluticasone-Salmeterol (ADVAIR DISKUS) 250-50 MCG/DOSE AEPB Inhale 1 puff into the lungs 2 (two) times daily. 04/07/16   Biagio Borg, MD  losartan-hydrochlorothiazide (HYZAAR) 100-25 MG tablet Take 1 tablet by mouth every morning. 12/07/15   Biagio Borg, MD     Family History  Problem Relation Age of Onset  . Stroke Mother   . COPD Father   . Lymphoma Sister     Social History   Social History  . Marital status: Married    Spouse name: N/A  . Number of children: 1  . Years of education: N/A   Occupational History  . PACKER [employer]   Social History Main Topics  . Smoking status: Former Smoker    Packs/day: 0.50    Years: 7.00    Types: Cigarettes    Quit date: 02/11/1988  . Smokeless tobacco: Never Used  . Alcohol use No  . Drug use: No     Comment: smoked marajuania x 10 years.  Quit in 1985.  Marland Kitchen Sexual activity: Yes    Birth control/ protection: Surgical   Other Topics Concern  .  Not on file   Social History Narrative   Environmental consultant on file.  [name] April 05, 2010 12:04 PM    Review of Systems: A 12 point ROS discussed and pertinent positives are indicated in the HPI above.  All other systems are negative.  Review of Systems  Constitutional: Negative.   Respiratory: Negative.   Cardiovascular: Negative.   Gastrointestinal: Negative.   Genitourinary: Negative.   Musculoskeletal: Negative.     Vital Signs: BP 130/78   Pulse 93   Temp 98.2 F (36.8 C)   SpO2 98%   Physical Exam  Constitutional: She appears well-developed and well-nourished. No distress.  Abdominal: Soft. She exhibits no distension. There is no tenderness. There is no rebound and no  guarding.  Skin: She is not diaphoretic.    Imaging: Ct Abdomen Pelvis W Contrast  Result Date: 08/12/2016 CLINICAL DATA:  56 year old female who initially presented in June of 8366 with complicated diverticulitis in an abscess in the region of the bladder dome with possible involvement the left ovary. The patient was initially seen by General surgery (Dr. Georgette Dover) and Urology (Dr. Tresa Moore). A multi disciplinary decision to proceed with conservative management was made and the patient was treated with percutaneous drain placement on 07/29/2016. The patient presents today for their initial follow-up evaluation. Her drain output has been minimal, no more than 5-10 cc of cloudy fluid daily. Her drain is currently maintained on JP bulb suction and she is flushing the drained twice daily. EXAM: CT ABDOMEN AND PELVIS WITH CONTRAST TECHNIQUE: Multidetector CT imaging of the abdomen and pelvis was performed using the standard protocol following bolus administration of intravenous contrast. CONTRAST:  163mL ISOVUE-300 IOPAMIDOL (ISOVUE-300) INJECTION 61% COMPARISON:  Prior CT abdomen/ pelvis 07/28/2016 ; MRI of the abdomen 02/07/2005 FINDINGS: Lower chest: The lung bases are clear. Visualized cardiac structures are within normal limits for size. No pericardial effusion. Unremarkable visualized distal thoracic esophagus. Hepatobiliary: Hypoechoic lesion in hepatic dome with some evidence of peripheral nodular enhancement is consistent with a benign hemangioma as noted on multiple prior imaging studies. No new lesion identified. Gallbladder is unremarkable. No intra or extrahepatic biliary ductal dilatation. Pancreas: Unremarkable. No pancreatic ductal dilatation or surrounding inflammatory changes. Spleen: Normal in size without focal abnormality. Adrenals/Urinary Tract: Adrenal glands are unremarkable. Kidneys are normal, without renal calculi, focal lesion, or hydronephrosis. Bladder is unremarkable. Stomach/Bowel:  Stomach is within normal limits. Appendix appears normal. No evidence of bowel wall thickening, distention, or inflammatory changes. Interval resolution of inflammatory changes surrounding the sigmoid colon. Vascular/Lymphatic: Trace atherosclerotic calcifications in the abdominal aorta. No aneurysm. No suspicious adenopathy. Reproductive: Status post hysterectomy. No adnexal masses. Other: No abdominal wall hernia or abnormality. No abdominopelvic ascites. Well-positioned percutaneous drainage catheter. The previously noted abscess in the bladder dome is no longer evident. Persistent phlegmonous or fibrous soft tissue tract extending from the region of the drain to the adjacent sigmoid colon. Musculoskeletal: No acute fracture or aggressive appearing lytic or blastic osseous lesion. L5-S1 degenerative disc disease. IMPRESSION: 1. Interval resolution of bladder dome abscess. The percutaneous drainage catheter remains well positioned. 2. Persistent, but less conspicuous phlegmonous or fibrous soft tissue tract extending from the region of the drain to the adjacent sigmoid colon. This may represent a persistent fistulous communication and will be further evaluated by drain injection under fluoroscopy. 3. Additional ancillary findings as above without significant interval change. Signed, Criselda Peaches, MD [facility] Radiology Electronically Signed   By:  Jacqulynn Cadet M.D.   On: 08/12/2016 10:34   Ct Abdomen Pelvis W Contrast  Result Date: 07/28/2016 CLINICAL DATA:  Back pain, dysuria, leukocytosis EXAM: CT ABDOMEN AND PELVIS WITH CONTRAST TECHNIQUE: Multidetector CT imaging of the abdomen and pelvis was performed using the standard protocol following bolus administration of intravenous contrast. CONTRAST:  144mL ISOVUE-300 IOPAMIDOL (ISOVUE-300) INJECTION 61% COMPARISON:  01/09/2011 FINDINGS: Lower chest: Lung bases are clear. Hepatobiliary: 3.5 cm  hemangioma along the right hepatic dome (series 3/ image 13), incompletely visualized. Additional smaller lesions are poorly evaluated. Gallbladder is unremarkable. No intrahepatic or extrahepatic ductal dilatation. Pancreas: Within normal limits. Spleen: Within normal limits. Adrenals/Urinary Tract: Adrenal glands are within normal limits. Right kidney is within normal limits. Two nonobstructing left lower pole renal calculi measuring up to 5 mm (coronal image 68). No ureteral or bladder calculi. No hydronephrosis. Bladder is notable for a 9.8 x 5.4 x 4.5 cm intramural abscess along the left superior bladder dome (series 3/ image 68; coronal image 65). Stomach/Bowel: Stomach is within normal limits. No evidence of bowel obstruction. Normal appendix (series 3/ image 60). Suspected fistula between the sigmoid colon and the intramural bladder abscess (coronal image 64). Vascular/Lymphatic: No evidence of abdominal aortic aneurysm. Atherosclerotic calcifications of the abdominal aorta and branch vessels. Small retroperitoneal lymph nodes which do not meet pathologic CT size criteria. Reproductive: Status post hysterectomy. Right ovary is within normal limits. Intramural bladder abscess is difficult to separate from the left ovary (series 3/image 64). Other: No abdominopelvic ascites. No free air. Musculoskeletal: Degenerative changes of the visualized thoracolumbar spine. IMPRESSION: 9.8 x 5.4 x 4.5 cm intramural abscess along the left superior bladder dome. Associated fistulous communication with the sigmoid colon. Possible involvement of the left ovary. No ascites or free air. Electronically Signed   By: Julian Hy M.D.   On: 07/28/2016 09:06   Dg Sinus/fist Tube Chk-non Gi  Result Date: 08/21/2016 INDICATION: Status post percutaneous drainage of diverticular abscess along the dome of the bladder on 07/29/2016. Subsequent imaging has demonstrated resolution of the abscess but evidence of a fistula to the  sigmoid colon on drain injection under fluoroscopy on 08/12/2016. She now presents for repeat injection of the drain. Clinically, there has been no output from the drain. EXAM: INJECTION OF PRE-EXISTING PERCUTANEOUS ABSCESS DRAINAGE CATHETER UNDER FLUOROSCOPY MEDICATIONS: None ANESTHESIA/SEDATION: None FLUOROSCOPY TIME:  1.0 minutes.  181 mGy CONTRAST:  20 mL Omnipaque 017 COMPLICATIONS: None immediate. PROCEDURE: The pre-existing drainage catheter was injected under fluoroscopy with contrast. Fluoroscopic spot images and cine loops were saved. Following injection, injected contrast material was aspirated. The catheter was then cut and removed. A gauze dressing was applied over the catheter exit site. FINDINGS: Injection of the drainage catheter demonstrates filling of a small irregular shaped residual abscess cavity that does not communicate with bowel or the bladder lumen. No further fistula is identified communicating with the sigmoid colon. The drainage catheter was successfully removed. IMPRESSION: Abscess drain injection demonstrates small irregular cavity with no further demonstrated fistula to the sigmoid colon. Given no significant output from the drain, the drain was removed after the injection procedure. Electronically Signed   By: Aletta Edouard M.D.   On: 08/21/2016 13:54   Dg Sinus/fist Tube Chk-non Gi  Result Date: 08/12/2016 INDICATION: 56 year old female who initially presented in June of 5102 with complicated diverticulitis in an abscess in the region of the bladder dome with possible involvement the left ovary. The patient was initially seen by General surgery (Dr.  Tsuei) and Urology (Dr. Tresa Moore). A multi disciplinary decision to proceed with conservative management was made and the patient was treated with percutaneous drain placement on 07/29/2016. The patient presents today for their initial follow-up evaluation. Her drain output has been minimal, no more than 5-10 cc of cloudy fluid daily.  Her drain is currently maintained on JP bulb suction and she is flushing the drained twice daily. CT imaging demonstrates no significant residual abscess but a possible persistent fistulous communication with the adjacent sigmoid colon. EXAM: Drain injection under fluoroscopy MEDICATIONS: None. ANESTHESIA/SEDATION: None. COMPLICATIONS: None immediate. PROCEDURE: A gentle hand injection of contrast material was performed. There is a persistent fistulous communication with the adjacent sigmoid colon. The abscess cavity is largely collapsed. The bladder is partially opacified with excreted contrast material from the earlier contrast-enhanced CT scan. IMPRESSION: Persistent fistulous communication with the adjacent sigmoid colon. PLAN: 1. Change from JP bulb suction to gravity bag drainage. 2. Cease further forward flushing. 3. Return to Interventional radiology drain clinic in 1- 2 weeks for repeat drain injection. Signed, Criselda Peaches, MD [facility] Radiology Electronically Signed   By: Jacqulynn Cadet M.D.   On: 08/12/2016 10:40   Ct Image Guided Drainage By Percutaneous Catheter  Result Date: 07/29/2016 INDICATION: Pelvic abscess at the dome of the bladder EXAM: CT GUIDED DRAINAGE OF PELVIC ABSCESS MEDICATIONS: The patient is currently admitted to the hospital and receiving intravenous antibiotics. The antibiotics were administered within an appropriate time frame prior to the initiation of the procedure. ANESTHESIA/SEDATION: Four mg IV Versed 200 mcg IV Fentanyl Moderate Sedation Time:  42 The patient was continuously monitored during the procedure by the interventional radiology nurse under my direct supervision. COMPLICATIONS: None immediate. TECHNIQUE: Informed written consent was obtained from the patient after a thorough discussion of the procedural risks, benefits and alternatives. All questions were addressed. Maximal Sterile Barrier Technique  was utilized including caps, mask, sterile gowns, sterile gloves, sterile drape, hand hygiene and skin antiseptic. A timeout was performed prior to the initiation of the procedure. PROCEDURE: The lower abdomen was prepped with ChloraPrep in a sterile fashion, and a sterile drape was applied covering the operative field. A sterile gown and sterile gloves were used for the procedure. Local anesthesia was provided with 1% Lidocaine. Under CT guidance, an 18 gauge needle was advanced into the pelvic abscess at the dome of the bladder. Cloudy fluid was aspirated. An Amplatz wire was coiled in the fluid collection. 50 cc iodinated contrast was injected with delayed imaging of the bladder. This was performed to insure that the wire was coiled in the abscess and outside of the bladder. A 10 French dilator followed by a 10 [place] drain was inserted and coiled in the abscess cavity. 30 cc brown pus was aspirated. It was string fixed and sewn to the skin. FINDINGS: Imaging demonstrates needle placement and wire coiled within the a abscess at the dome of the bladder. The CT cystogram demonstrates contrast filling the bladder adjacent to the abscess cavity. The wire is coiled in the abscess cavity Final imaging demonstrates 10 French drain placement in the pelvic abscess. IMPRESSION: Successful 10 French pelvic abscess drainage yielding pus. Electronically Signed   By: Marybelle Killings M.D.   On: 07/29/2016 16:27    Labs:  CBC:  Recent Labs  07/30/16 0642 07/31/16 0910 08/01/16 0652 08/12/16 1654  WBC 11.6* 11.6* 10.1 12.8*  HGB 11.5* 12.2 11.5* 14.2  HCT 36.7 39.3 37.6 43.2  PLT 315 350 352 341.0    COAGS:  Recent Labs  07/28/16 1400  INR 1.11  APTT 29    BMP:  Recent Labs  07/30/16 0642 07/31/16 0910 08/01/16 0652 08/02/16 0537 08/12/16 1654  NA 137 143 142 142 139  K 2.9* 3.6 3.8 4.5 3.9  CL 102 106 109 110 100  CO2 30 28 26 24 31   GLUCOSE 155* 112* 102* 100* 97  BUN 10 10 9 11 15     CALCIUM 8.4* 9.1 8.8* 8.8* 10.1  CREATININE 1.06* 0.86 0.85 0.85 0.96  GFRNONAA 58* >60 >60 >60  --   GFRAA >60 >60 >60 >60  --     LIVER FUNCTION TESTS:  Recent Labs  07/29/16 0505 07/30/16 0642 08/01/16 0652 08/12/16 1654  BILITOT 0.5 0.1* 0.3 0.8  AST 24 25 24 21   ALT 18 18 19 21   ALKPHOS 85 73 68 96  PROT 6.5 6.1* 6.2* 8.1  ALBUMIN 2.7* 2.6* 2.7* 4.1    Assessment and Plan:  The drainage catheter was injected under fluoroscopy today with resolution of definable fistula to the sigmoid colon. There is no further output from the drain and the drain was successfully removed today. Mrs. Zervas states that she currently does not have any other follow-up appointments. She has seen Dr. Benson Norway in the past for colonoscopy. Given the recent acute diverticulitis resulting in abscess and fistula, I recommended that she follow-up with Dr. Benson Norway and Dr. Marcello Moores.  Electronically SignedAletta Edouard T 08/21/2016, 4:49 PM   I spent a total of 15 Minutes in face to face in clinical consultation, greater than 50% of which was counseling/coordinating care for diverticular abscess drain management.

## 2016-08-21 NOTE — Progress Notes (Signed)
Subjective:    Patient ID: [name], female    DOB: 1960/08/10, 56 y.o.   MRN: [id number]  HPI  Here to f/u, Denies urinary symptoms such as dysuria, frequency, urgency, flank pain, hematuria or n/v, fever, chills, and drain pulled earlier today.  Pt denies chest pain, increased sob or doe, wheezing, orthopnea, PND, increased LE swelling, palpitations, dizziness or syncope.   Pt denies polydipsia, polyuria Pt denies new neurological symptoms such as new headache, or facial or extremity weakness or numbness  Past Medical History:  Diagnosis Date  . Alcohol abuse    abuse- moderate years ago - states only drinks now on weekends -2 to 8 driinks   . Asthma   . COLONIC POLYPS, HX OF 04/05/2010  . DIVERTICULITIS, HX OF 04/05/2010  . DJD (degenerative joint disease)    right knee, mot to severe  . GERD (gastroesophageal reflux disease)    no meds  . Heart murmur    hx of   . Hyperlipidemia   . Hypertension   . Impaired glucose tolerance 12/06/2013  . Obesity (BMI 30-39.9)   . PALPITATIONS, HX OF 09/14/2007   Past Surgical History:  Procedure Laterality Date  . ABDOMINAL HYSTERECTOMY    . COLONOSCOPY WITH PROPOFOL N/A 07/25/2016   Procedure: COLONOSCOPY WITH PROPOFOL;  Surgeon: Carol Ada, MD;  Location: [facility];  Service: Endoscopy;  Laterality: N/A;  . colonscopy     x 2  . KNEE ARTHROSCOPY     left   . TOTAL KNEE ARTHROPLASTY  07/29/2011   Procedure: TOTAL KNEE ARTHROPLASTY;  Surgeon: Mauri Pole, MD;  Location: [facility];  Service: Orthopedics;  Laterality: Right;  . TOTAL KNEE ARTHROPLASTY Left 12/14/2012   Procedure: LEFT TOTAL KNEE ARTHROPLASTY;  Surgeon: Mauri Pole, MD;  Location: [facility];  Service: Orthopedics;  Laterality: Left;  Marland Kitchen VESICOVAGINAL FISTULA CLOSURE W/ TAH      reports that she quit smoking about 28 years ago. Her smoking use included Cigarettes. She has a 3.50 pack-year smoking history. She has never used smokeless tobacco. She reports that she does  not drink alcohol or use drugs. family history includes COPD in her father; Lymphoma in her sister; Stroke in her mother. Allergies  Allergen Reactions  . Morphine Itching  . Shrimp [Shellfish Allergy] Itching    Tongue burns  . Tramadol Other (See Comments)    confusion  . Latex Rash   Current Outpatient Prescriptions on File Prior to Visit  Medication Sig Dispense Refill  . albuterol (PROVENTIL HFA;VENTOLIN HFA) 108 (90 Base) MCG/ACT inhaler Inhale 1-2 puffs into the lungs every 6 (six) hours as needed for wheezing. 1 Inhaler 0  . atorvastatin (LIPITOR) 10 MG tablet Take 1 tablet (10 mg total) by mouth daily. 90 tablet 3  . Fluticasone-Salmeterol (ADVAIR DISKUS) 250-50 MCG/DOSE AEPB Inhale 1 puff into the lungs 2 (two) times daily. 3 each 2  . losartan-hydrochlorothiazide (HYZAAR) 100-25 MG tablet Take 1 tablet by mouth every morning. 90 tablet 3   No current facility-administered medications on file prior to visit.    Review of Systems  Constitutional: Negative for other unusual diaphoresis or sweats HENT: Negative for ear discharge or swelling Eyes: Negative for other worsening visual disturbances Respiratory: Negative for stridor or other swelling  Gastrointestinal: Negative for worsening distension or other blood Genitourinary: Negative for retention or other urinary change Musculoskeletal: Negative for other MSK pain or swelling Skin: Negative for color change or other new lesions Neurological: Negative  for worsening tremors and other numbness  Psychiatric/Behavioral: Negative for worsening agitation or other fatigue All other system neg per pt    Objective:   Physical Exam BP 136/86   Pulse 85   Ht 5\' 1"  (1.549 m)   Wt 283 lb (128.4 kg)   SpO2 100%   BMI 53.47 kg/m  VS noted,  Constitutional: Pt appears in NAD HENT: Head: NCAT.  Right Ear: External ear normal.  Left Ear: External ear normal.  Eyes: . Pupils are equal, round, and reactive to light. Conjunctivae  and EOM are normal Nose: without d/c or deformity Neck: Neck supple. Gross normal ROM Cardiovascular: Normal rate and regular rhythm.   Pulmonary/Chest: Effort normal and breath sounds without rales or wheezing.  Abd:  Soft, NT, ND, + BS, no organomegaly Neurological: Pt is alert. At baseline orientation, motor grossly intact Skin: Skin is warm. No rashes, other new lesions, no LE edema Psychiatric: Pt behavior is normal without agitation  No other exam findings Lab Results  Component Value Date   WBC 10.3 08/21/2016   HGB 13.1 08/21/2016   HCT 39.6 08/21/2016   PLT 220.0 08/21/2016   GLUCOSE 97 08/12/2016   CHOL 171 08/21/2016   TRIG 72.0 08/21/2016   HDL 62.00 08/21/2016   LDLDIRECT 107.0 02/23/2012   LDLCALC 95 08/21/2016   ALT 21 08/12/2016   AST 21 08/12/2016   NA 139 08/12/2016   K 3.9 08/12/2016   CL 100 08/12/2016   CREATININE 0.96 08/12/2016   BUN 15 08/12/2016   CO2 31 08/12/2016   TSH 2.35 04/17/2016   INR 1.11 07/28/2016   HGBA1C 5.9 04/17/2016       Assessment & Plan:

## 2016-08-22 LAB — LIPID PANEL
Cholesterol: 171 mg/dL (ref 0–200)
HDL: 62 mg/dL (ref >39.00)
LDL Cholesterol: 95 mg/dL (ref 0–99)
NonHDL: 109.19
Total CHOL/HDL Ratio: 3
Triglycerides: 72 mg/dL (ref 0.0–149.0)
VLDL: 14.4 mg/dL (ref 0.0–40.0)

## 2016-08-23 NOTE — Assessment & Plan Note (Signed)
Mild elev LDL, for lower chol diet

## 2016-08-23 NOTE — Assessment & Plan Note (Signed)
stable overall by history and exam, recent data reviewed with pt, and pt to continue medical treatment as before,  to f/u any worsening symptoms or concerns BP Readings from Last 3 Encounters:  08/21/16 130/78  08/21/16 136/86  08/12/16 (!) 149/80

## 2016-08-23 NOTE — Assessment & Plan Note (Signed)
stable overall by history and exam, recent data reviewed with pt, and pt to continue medical treatment as before,  to f/u any worsening symptoms or concerns Lab Results  Component Value Date   HGBA1C 5.9 04/17/2016

## 2016-08-23 NOTE — Assessment & Plan Note (Signed)
Symptoms resolved, s/p drain removal this am, cont to follow

## 2016-08-28 NOTE — Telephone Encounter (Signed)
Forms complete, sent to scan &charged for.

## 2016-08-29 ENCOUNTER — Telehealth: Payer: Self-pay | Admitting: Internal Medicine

## 2016-08-29 MED ORDER — PREDNISONE 10 MG PO TABS: ORAL_TABLET | ORAL | 0 refills | Status: DC

## 2016-08-29 NOTE — Telephone Encounter (Signed)
[place] for prednisone, but will need ROV if not improved or worsened, or if symptoms return later, thanks

## 2016-08-29 NOTE — Telephone Encounter (Signed)
Pt said that she has a cold that is interfering with her asthma. She wanted to know if a prescription for prednizone could be sent over? She said that this has been prescribed for her in the past.  Pharmacy: [facility] [address]

## 2016-08-29 NOTE — Telephone Encounter (Signed)
Pt called, LVM informing her of the below msg.

## 2016-08-29 NOTE — Telephone Encounter (Signed)
Patient called back.  Informed of Dr. Gwynn Burly message.  Please remove [facility] pharmacy from list.

## 2016-09-11 ENCOUNTER — Encounter (HOSPITAL_COMMUNITY): Payer: Self-pay | Admitting: Emergency Medicine

## 2016-09-11 ENCOUNTER — Inpatient Hospital Stay (HOSPITAL_COMMUNITY)
Admission: EM | Admit: 2016-09-11 | Discharge: 2016-09-14 | DRG: 872 | Disposition: A | Discharge status: Home / Self Care | Payer: BLUE CROSS/BLUE SHIELD | Attending: Family Medicine | Admitting: Family Medicine

## 2016-09-11 ENCOUNTER — Telehealth: Payer: Self-pay | Admitting: Internal Medicine

## 2016-09-11 DIAGNOSIS — I10 Essential (primary) hypertension: Secondary | ICD-10-CM | POA: Diagnosis not present

## 2016-09-11 DIAGNOSIS — Z6841 Body Mass Index (BMI) 40.0 and over, adult: Secondary | ICD-10-CM | POA: Diagnosis present

## 2016-09-11 DIAGNOSIS — Z885 Allergy status to narcotic agent status: Secondary | ICD-10-CM

## 2016-09-11 DIAGNOSIS — E785 Hyperlipidemia, unspecified: Secondary | ICD-10-CM | POA: Diagnosis not present

## 2016-09-11 DIAGNOSIS — J45909 Unspecified asthma, uncomplicated: Secondary | ICD-10-CM | POA: Diagnosis not present

## 2016-09-11 DIAGNOSIS — A419 Sepsis, unspecified organism: Secondary | ICD-10-CM | POA: Diagnosis not present

## 2016-09-11 DIAGNOSIS — K5732 Diverticulitis of large intestine without perforation or abscess without bleeding: Secondary | ICD-10-CM | POA: Diagnosis present

## 2016-09-11 DIAGNOSIS — Z90710 Acquired absence of both cervix and uterus: Secondary | ICD-10-CM

## 2016-09-11 DIAGNOSIS — Z87891 Personal history of nicotine dependence: Secondary | ICD-10-CM | POA: Diagnosis not present

## 2016-09-11 DIAGNOSIS — Z823 Family history of stroke: Secondary | ICD-10-CM

## 2016-09-11 DIAGNOSIS — Z888 Allergy status to other drugs, medicaments and biological substances status: Secondary | ICD-10-CM | POA: Diagnosis not present

## 2016-09-11 DIAGNOSIS — N321 Vesicointestinal fistula: Secondary | ICD-10-CM | POA: Diagnosis not present

## 2016-09-11 DIAGNOSIS — B9689 Other specified bacterial agents as the cause of diseases classified elsewhere: Secondary | ICD-10-CM | POA: Diagnosis not present

## 2016-09-11 DIAGNOSIS — Z86010 Personal history of colonic polyps: Secondary | ICD-10-CM | POA: Diagnosis not present

## 2016-09-11 DIAGNOSIS — N939 Abnormal uterine and vaginal bleeding, unspecified: Secondary | ICD-10-CM | POA: Diagnosis present

## 2016-09-11 DIAGNOSIS — R319 Hematuria, unspecified: Secondary | ICD-10-CM | POA: Diagnosis present

## 2016-09-11 DIAGNOSIS — E876 Hypokalemia: Secondary | ICD-10-CM | POA: Diagnosis present

## 2016-09-11 DIAGNOSIS — K219 Gastro-esophageal reflux disease without esophagitis: Secondary | ICD-10-CM | POA: Diagnosis present

## 2016-09-11 DIAGNOSIS — Z91040 Latex allergy status: Secondary | ICD-10-CM

## 2016-09-11 DIAGNOSIS — N760 Acute vaginitis: Secondary | ICD-10-CM | POA: Diagnosis not present

## 2016-09-11 DIAGNOSIS — Z807 Family history of other malignant neoplasms of lymphoid, hematopoietic and related tissues: Secondary | ICD-10-CM

## 2016-09-11 DIAGNOSIS — Z825 Family history of asthma and other chronic lower respiratory diseases: Secondary | ICD-10-CM | POA: Diagnosis not present

## 2016-09-11 DIAGNOSIS — Z91013 Allergy to seafood: Secondary | ICD-10-CM | POA: Diagnosis not present

## 2016-09-11 DIAGNOSIS — N3080 Other cystitis without hematuria: Secondary | ICD-10-CM | POA: Diagnosis not present

## 2016-09-11 DIAGNOSIS — Z96653 Presence of artificial knee joint, bilateral: Secondary | ICD-10-CM | POA: Diagnosis present

## 2016-09-11 DIAGNOSIS — N739 Female pelvic inflammatory disease, unspecified: Secondary | ICD-10-CM | POA: Diagnosis not present

## 2016-09-11 LAB — CBC
HCT: 39.1 % (ref 36.0–46.0)
Hemoglobin: 12.9 g/dL (ref 12.0–15.0)
MCH: 28.9 pg (ref 26.0–34.0)
MCHC: 33 g/dL (ref 30.0–36.0)
MCV: 87.5 fL (ref 78.0–100.0)
Platelets: 307 10*3/uL (ref 150–400)
RBC: 4.47 MIL/uL (ref 3.87–5.11)
RDW: 14.7 % (ref 11.5–15.5)
WBC: 16.5 10*3/uL — ABNORMAL HIGH (ref 4.0–10.5)

## 2016-09-11 LAB — BASIC METABOLIC PANEL
Anion gap: 8 (ref 5–15)
BUN: 12 mg/dL (ref 6–20)
CO2: 27 mmol/L (ref 22–32)
Calcium: 9.1 mg/dL (ref 8.9–10.3)
Chloride: 102 mmol/L (ref 101–111)
Creatinine, Ser: 0.89 mg/dL (ref 0.44–1.00)
GFR calc Af Amer: >60 mL/min (ref >60)
GFR calc non Af Amer: >60 mL/min (ref >60)
Glucose, Bld: 110 mg/dL — ABNORMAL HIGH (ref 65–99)
Potassium: 2.9 mmol/L — ABNORMAL LOW (ref 3.5–5.1)
Sodium: 137 mmol/L (ref 135–145)

## 2016-09-11 LAB — URINALYSIS, ROUTINE W REFLEX MICROSCOPIC
Bilirubin Urine: NEGATIVE
Glucose, UA: NEGATIVE mg/dL
Ketones, ur: NEGATIVE mg/dL
Nitrite: NEGATIVE
Protein, ur: 100 mg/dL — AB
Specific Gravity, Urine: 1.023 (ref 1.005–1.030)
pH: 5 (ref 5.0–8.0)

## 2016-09-11 NOTE — Telephone Encounter (Signed)
Error

## 2016-09-11 NOTE — ED Provider Notes (Signed)
By signing my name below, I, [name], attest that this documentation has been prepared under the direction and in the presence of [name], Delice Bison, DO. Electronically Signed: [name], ED Scribe. 09/12/16. 12:09 AM.   TIME SEEN: 12:02 AM  CHIEF COMPLAINT: Hematuria  HPI: HPI Comments: [name] is a 56 y.o. female with a PMHx of partial hysterectomy, previous bladder abscess with fistula requiring IR drainage, who presents to the Emergency Department complaining of sudden onset, heavy vaginal bleeding onset today. Pt was admitted to the hospital on 07/28/16 and diagnosed with a bladder abscess and sigmoid fistula. She underwent percutaneous drainage with interventional radiology.. She had her drain removed 2 weeks afterwards. She was told that her fistula had "resolved". Pt reports associated nausea, abdominal cramping and dysuria. Pt denies fever, vomiting, diarrhea, vaginal discharge or any other complaints at this time.   ROS: See HPI Constitutional: no fever  Eyes: no drainage  ENT: no runny nose   Cardiovascular:  no chest pain  Resp: no SOB  GI: no vomiting, +abdominal pain, +nausea GU: +dysuria, Integumentary: no rash  Allergy: no hives  Musculoskeletal: no leg swelling  Neurological: no slurred speech ROS otherwise negative  PAST MEDICAL HISTORY/PAST SURGICAL HISTORY:  Past Medical History:  Diagnosis Date  . Alcohol abuse    abuse- moderate years ago - states only drinks now on weekends -2 to 8 driinks   . Asthma   . COLONIC POLYPS, HX OF 04/05/2010  . DIVERTICULITIS, HX OF 04/05/2010  . DJD (degenerative joint disease)    right knee, mot to severe  . GERD (gastroesophageal reflux disease)    no meds  . Heart murmur    hx of   . Hyperlipidemia   . Hypertension   . Impaired glucose tolerance 12/06/2013  . Obesity (BMI 30-39.9)   . PALPITATIONS, HX OF 09/14/2007    MEDICATIONS:  Prior to Admission medications   Medication Sig Start Date End Date  Taking? Authorizing Provider  albuterol (PROVENTIL HFA;VENTOLIN HFA) 108 (90 Base) MCG/ACT inhaler Inhale 1-2 puffs into the lungs every 6 (six) hours as needed for wheezing. 04/18/16   Larene Pickett, PA-C  atorvastatin (LIPITOR) 10 MG tablet Take 1 tablet (10 mg total) by mouth daily. 12/07/15   Biagio Borg, MD  Fluticasone-Salmeterol (ADVAIR DISKUS) 250-50 MCG/DOSE AEPB Inhale 1 puff into the lungs 2 (two) times daily. 04/07/16   Biagio Borg, MD  losartan-hydrochlorothiazide (HYZAAR) 100-25 MG tablet Take 1 tablet by mouth every morning. 12/07/15   Biagio Borg, MD  predniSONE (DELTASONE) 10 MG tablet 3 tabs by mouth per day for 3 days,2tabs per day for 3 days,1tab per day for 3 days 08/29/16   Biagio Borg, MD    ALLERGIES:  Allergies  Allergen Reactions  . Morphine Itching  . Shrimp [Shellfish Allergy] Itching    Tongue burns  . Tramadol Other (See Comments)    confusion  . Latex Rash    SOCIAL HISTORY:  Social History  Substance Use Topics  . Smoking status: Former Smoker    Packs/day: 0.50    Years: 7.00    Types: Cigarettes    Quit date: 02/11/1988  . Smokeless tobacco: Never Used  . Alcohol use No    FAMILY HISTORY: Family History  Problem Relation Age of Onset  . Stroke Mother   . COPD Father   . Lymphoma Sister     EXAM: BP 112/82   Pulse 95   Temp 98.4  F (36.9 C) (Oral)   Resp 18   SpO2 98%  CONSTITUTIONAL: Alert and oriented and responds appropriately to questions. Obese, in no distress, afebrile, nontoxic-appearing HEAD: Normocephalic EYES: Conjunctivae clear, pupils appear equal, EOMI ENT: normal nose; moist mucous membranes NECK: Supple, no meningismus, no nuchal rigidity, no LAD  CARD: RRR; S1 and S2 appreciated; no murmurs, no clicks, no rubs, no gallops RESP: Normal chest excursion without splinting or tachypnea; breath sounds clear and equal bilaterally; no wheezes, no rhonchi, no rales, no hypoxia or respiratory distress, speaking full  sentences ABD/GI: Normal bowel sounds; non-distended; soft, mildly tender throughout the lower abdomen, no rebound, no guarding, no peritoneal signs, no hepatosplenomegaly GU:  Normal external genitalia. No lesions, rashes noted. Patient has a small amount of dark vaginal bleeding on exam. No vaginal discharge.  No adnexal tenderness, mass or fullness. Cervix is not visualized and was likely surgically removed. Chaperone present for exam. BACK:  The back appears normal and is non-tender to palpation, there is no CVA tenderness EXT: Normal ROM in all joints; non-tender to palpation; no edema; normal capillary refill; no cyanosis, no calf tenderness or swelling    SKIN: Normal color for age and race; warm; no rash NEURO: Moves all extremities equally PSYCH: The patient's mood and manner are appropriate. Grooming and personal hygiene are appropriate.  MEDICAL DECISION MAKING: Patient here with urinary tract infection. Labs show leukocytosis with left shift. Also mildly hyponatremic. We will give replacement. Pelvic exam reveals vaginal bleeding but I do not see any source of the bleeding at this time or any discharge or other purulent drainage. Will obtain blood cultures, lactate. We'll give IV Zosyn. Her last urine culture grew Proteus and Escherichia coli. We'll obtain CT of her abdomen and pelvis for further violation and determine if there is a another abscess or fistula present.  ED PROGRESS: CT scan looks like there is a 13 mm hypodense collection at the bladder dam concerning for recurrent abscess with a persistent soft tissue band extending from the sigmoid colon likely reflecting a fistulous track. We'll discuss with medicine for admission.   4:34 AM Discussed patient's case with hospitalist, Dr. Tamala Julian.  I have recommended admission and patient (and family if present) agree with this plan. Admitting physician will place admission orders.   I reviewed all nursing notes, vitals, pertinent  previous records, EKGs, lab and urine results, imaging (as available).    CRITICAL CARE Performed by: Nyra Jabs   Total critical care time: 45 minutes  Critical care time was exclusive of separately billable procedures and treating other patients.  Critical care was necessary to treat or prevent imminent or life-threatening deterioration.  Critical care was time spent personally by me on the following activities: development of treatment plan with patient and/or surrogate as well as nursing, discussions with consultants, evaluation of patient's response to treatment, examination of patient, obtaining history from patient or surrogate, ordering and performing treatments and interventions, ordering and review of laboratory studies, ordering and review of radiographic studies, pulse oximetry and re-evaluation of patient's condition.    I personally performed the services described in this documentation, which was scribed in my presence. The recorded information has been reviewed and is accurate.      Rayder Sullenger, Delice Bison, DO 09/12/16 [phone]

## 2016-09-11 NOTE — ED Triage Notes (Signed)
Pt c/o blood in urine beginning today, vaginal bleeding and painful urination. Hx partial hysterectomy. Pt states she had an abscess removed from bladder in June.

## 2016-09-12 ENCOUNTER — Ambulatory Visit: Payer: Commercial Managed Care - PPO | Admitting: Internal Medicine

## 2016-09-12 ENCOUNTER — Emergency Department (HOSPITAL_COMMUNITY): Payer: BLUE CROSS/BLUE SHIELD

## 2016-09-12 DIAGNOSIS — E785 Hyperlipidemia, unspecified: Secondary | ICD-10-CM | POA: Diagnosis present

## 2016-09-12 DIAGNOSIS — N3080 Other cystitis without hematuria: Secondary | ICD-10-CM | POA: Diagnosis not present

## 2016-09-12 DIAGNOSIS — Z90710 Acquired absence of both cervix and uterus: Secondary | ICD-10-CM | POA: Diagnosis not present

## 2016-09-12 DIAGNOSIS — N321 Vesicointestinal fistula: Secondary | ICD-10-CM

## 2016-09-12 DIAGNOSIS — N760 Acute vaginitis: Secondary | ICD-10-CM | POA: Diagnosis not present

## 2016-09-12 DIAGNOSIS — K5732 Diverticulitis of large intestine without perforation or abscess without bleeding: Secondary | ICD-10-CM | POA: Diagnosis present

## 2016-09-12 DIAGNOSIS — Z807 Family history of other malignant neoplasms of lymphoid, hematopoietic and related tissues: Secondary | ICD-10-CM | POA: Diagnosis not present

## 2016-09-12 DIAGNOSIS — E876 Hypokalemia: Secondary | ICD-10-CM | POA: Diagnosis present

## 2016-09-12 DIAGNOSIS — Z825 Family history of asthma and other chronic lower respiratory diseases: Secondary | ICD-10-CM | POA: Diagnosis not present

## 2016-09-12 DIAGNOSIS — Z885 Allergy status to narcotic agent status: Secondary | ICD-10-CM | POA: Diagnosis not present

## 2016-09-12 DIAGNOSIS — N939 Abnormal uterine and vaginal bleeding, unspecified: Secondary | ICD-10-CM

## 2016-09-12 DIAGNOSIS — B9689 Other specified bacterial agents as the cause of diseases classified elsewhere: Secondary | ICD-10-CM

## 2016-09-12 DIAGNOSIS — Z6841 Body Mass Index (BMI) 40.0 and over, adult: Secondary | ICD-10-CM | POA: Diagnosis not present

## 2016-09-12 DIAGNOSIS — I10 Essential (primary) hypertension: Secondary | ICD-10-CM | POA: Diagnosis present

## 2016-09-12 DIAGNOSIS — Z86010 Personal history of colonic polyps: Secondary | ICD-10-CM | POA: Diagnosis not present

## 2016-09-12 DIAGNOSIS — Z823 Family history of stroke: Secondary | ICD-10-CM | POA: Diagnosis not present

## 2016-09-12 DIAGNOSIS — J45909 Unspecified asthma, uncomplicated: Secondary | ICD-10-CM

## 2016-09-12 DIAGNOSIS — Z87891 Personal history of nicotine dependence: Secondary | ICD-10-CM | POA: Diagnosis not present

## 2016-09-12 DIAGNOSIS — A419 Sepsis, unspecified organism: Secondary | ICD-10-CM | POA: Diagnosis present

## 2016-09-12 DIAGNOSIS — N739 Female pelvic inflammatory disease, unspecified: Secondary | ICD-10-CM | POA: Diagnosis not present

## 2016-09-12 DIAGNOSIS — Z888 Allergy status to other drugs, medicaments and biological substances status: Secondary | ICD-10-CM | POA: Diagnosis not present

## 2016-09-12 DIAGNOSIS — R319 Hematuria, unspecified: Secondary | ICD-10-CM | POA: Diagnosis present

## 2016-09-12 DIAGNOSIS — Z91013 Allergy to seafood: Secondary | ICD-10-CM | POA: Diagnosis not present

## 2016-09-12 DIAGNOSIS — Z96653 Presence of artificial knee joint, bilateral: Secondary | ICD-10-CM | POA: Diagnosis present

## 2016-09-12 DIAGNOSIS — K219 Gastro-esophageal reflux disease without esophagitis: Secondary | ICD-10-CM | POA: Diagnosis present

## 2016-09-12 DIAGNOSIS — Z91040 Latex allergy status: Secondary | ICD-10-CM | POA: Diagnosis not present

## 2016-09-12 LAB — I-STAT CG4 LACTIC ACID, ED: Lactic Acid, Venous: 1.13 mmol/L (ref 0.5–1.9)

## 2016-09-12 LAB — SEDIMENTATION RATE: Sed Rate: 37 mm/hr — ABNORMAL HIGH (ref 0–22)

## 2016-09-12 LAB — DIFFERENTIAL
Basophils Absolute: 0 10*3/uL (ref 0.0–0.1)
Basophils Relative: 0 %
Eosinophils Absolute: 0.2 10*3/uL (ref 0.0–0.7)
Eosinophils Relative: 1 %
Lymphocytes Relative: 18 %
Lymphs Abs: 2.8 10*3/uL (ref 0.7–4.0)
Monocytes Absolute: 0.9 10*3/uL (ref 0.1–1.0)
Monocytes Relative: 6 %
Neutro Abs: 11.6 10*3/uL — ABNORMAL HIGH (ref 1.7–7.7)
Neutrophils Relative %: 75 %

## 2016-09-12 LAB — GC/CHLAMYDIA PROBE AMP (~~LOC~~) NOT AT ARMC
Chlamydia: NEGATIVE
Neisseria Gonorrhea: NEGATIVE

## 2016-09-12 LAB — WET PREP, GENITAL
Sperm: NONE SEEN
Trich, Wet Prep: NONE SEEN
Yeast Wet Prep HPF POC: NONE SEEN

## 2016-09-12 MED ORDER — LOSARTAN POTASSIUM-HCTZ 100-25 MG PO TABS: 1.0000 | ORAL_TABLET | Freq: Every morning | ORAL | Status: DC

## 2016-09-12 MED ORDER — PSYLLIUM 95 % PO PACK
1.0000 | PACK | Freq: Every day | ORAL | Status: DC
  Administered 2016-09-12 – 2016-09-14 (×3): 1 via ORAL
  Filled 2016-09-12 (×3): qty 1

## 2016-09-12 MED ORDER — SODIUM CHLORIDE 0.9 % IV BOLUS (SEPSIS)
1000.0000 mL | Freq: Once | INTRAVENOUS | Status: AC
  Administered 2016-09-12: 1000 mL via INTRAVENOUS

## 2016-09-12 MED ORDER — ACETAMINOPHEN 325 MG PO TABS: 650.0000 mg | ORAL_TABLET | Freq: Four times a day (QID) | ORAL | Status: DC | PRN

## 2016-09-12 MED ORDER — LOSARTAN POTASSIUM 50 MG PO TABS
100.0000 mg | ORAL_TABLET | Freq: Every day | ORAL | Status: DC
  Administered 2016-09-12 – 2016-09-14 (×3): 100 mg via ORAL
  Filled 2016-09-12 (×3): qty 2

## 2016-09-12 MED ORDER — POTASSIUM CHLORIDE CRYS ER 20 MEQ PO TBCR
40.0000 meq | EXTENDED_RELEASE_TABLET | Freq: Once | ORAL | Status: AC
  Administered 2016-09-12: 40 meq via ORAL
  Filled 2016-09-12: qty 2

## 2016-09-12 MED ORDER — FENTANYL CITRATE (PF) 100 MCG/2ML IJ SOLN
50.0000 ug | Freq: Once | INTRAMUSCULAR | Status: AC
  Administered 2016-09-12: 50 ug via INTRAVENOUS
  Filled 2016-09-12: qty 2

## 2016-09-12 MED ORDER — DOCUSATE SODIUM 100 MG PO CAPS
100.0000 mg | ORAL_CAPSULE | Freq: Two times a day (BID) | ORAL | Status: DC
  Administered 2016-09-12 – 2016-09-14 (×5): 100 mg via ORAL
  Filled 2016-09-12 (×5): qty 1

## 2016-09-12 MED ORDER — IOPAMIDOL (ISOVUE-300) INJECTION 61%
INTRAVENOUS | Status: AC
  Administered 2016-09-12: 100 mL
  Filled 2016-09-12: qty 100

## 2016-09-12 MED ORDER — ONDANSETRON HCL 4 MG/2ML IJ SOLN: 4.0000 mg | Freq: Four times a day (QID) | INTRAMUSCULAR | Status: DC | PRN

## 2016-09-12 MED ORDER — METRONIDAZOLE 500 MG PO TABS
500.0000 mg | ORAL_TABLET | Freq: Two times a day (BID) | ORAL | Status: DC
  Administered 2016-09-12: 500 mg via ORAL
  Filled 2016-09-12: qty 1

## 2016-09-12 MED ORDER — ATORVASTATIN CALCIUM 10 MG PO TABS
10.0000 mg | ORAL_TABLET | Freq: Every day | ORAL | Status: DC
  Administered 2016-09-12 – 2016-09-14 (×3): 10 mg via ORAL
  Filled 2016-09-12 (×3): qty 1

## 2016-09-12 MED ORDER — PIPERACILLIN-TAZOBACTAM 3.375 G IVPB 30 MIN
3.3750 g | Freq: Once | INTRAVENOUS | Status: AC
  Administered 2016-09-12: 3.375 g via INTRAVENOUS
  Filled 2016-09-12: qty 50

## 2016-09-12 MED ORDER — ONDANSETRON HCL 4 MG PO TABS: 4.0000 mg | ORAL_TABLET | Freq: Four times a day (QID) | ORAL | Status: DC | PRN

## 2016-09-12 MED ORDER — FENTANYL CITRATE (PF) 100 MCG/2ML IJ SOLN: 25.0000 ug | INTRAMUSCULAR | Status: DC | PRN

## 2016-09-12 MED ORDER — MOMETASONE FURO-FORMOTEROL FUM 200-5 MCG/ACT IN AERO
2.0000 | INHALATION_SPRAY | Freq: Two times a day (BID) | RESPIRATORY_TRACT | Status: DC
  Administered 2016-09-12 – 2016-09-13 (×4): 2 via RESPIRATORY_TRACT
  Filled 2016-09-12: qty 8.8

## 2016-09-12 MED ORDER — ACETAMINOPHEN 650 MG RE SUPP: 650.0000 mg | Freq: Four times a day (QID) | RECTAL | Status: DC | PRN

## 2016-09-12 MED ORDER — METRONIDAZOLE 500 MG PO TABS
500.0000 mg | ORAL_TABLET | Freq: Once | ORAL | Status: AC
  Administered 2016-09-12: 500 mg via ORAL
  Filled 2016-09-12: qty 1

## 2016-09-12 MED ORDER — HYDROCHLOROTHIAZIDE 12.5 MG PO CAPS
12.5000 mg | ORAL_CAPSULE | Freq: Every day | ORAL | Status: DC
  Administered 2016-09-14: 12.5 mg via ORAL
  Filled 2016-09-12: qty 1

## 2016-09-12 MED ORDER — ALBUTEROL SULFATE (2.5 MG/3ML) 0.083% IN NEBU: 2.5000 mg | INHALATION_SOLUTION | Freq: Four times a day (QID) | RESPIRATORY_TRACT | Status: DC | PRN

## 2016-09-12 MED ORDER — HYDROCHLOROTHIAZIDE 25 MG PO TABS
25.0000 mg | ORAL_TABLET | Freq: Every day | ORAL | Status: DC
  Administered 2016-09-12: 25 mg via ORAL
  Filled 2016-09-12: qty 1

## 2016-09-12 MED ORDER — PIPERACILLIN-TAZOBACTAM 3.375 G IVPB
3.3750 g | Freq: Three times a day (TID) | INTRAVENOUS | Status: DC
  Administered 2016-09-12 – 2016-09-14 (×6): 3.375 g via INTRAVENOUS
  Filled 2016-09-12 (×8): qty 50

## 2016-09-12 MED ORDER — HYDROCODONE-ACETAMINOPHEN 5-325 MG PO TABS
1.0000 | ORAL_TABLET | ORAL | Status: DC | PRN
  Administered 2016-09-12 – 2016-09-14 (×8): 2 via ORAL
  Filled 2016-09-12 (×8): qty 2

## 2016-09-12 NOTE — Consult Note (Signed)
Urology Consult   Physician requesting consult: Dr. Wynetta Emery  Reason for consult: Possible colovesical fistula, pelvic abscess  History of Present Illness: [name] is a 56 y.o. who was seen by Dr. Tresa Moore in mid June during her hospital admission.  At that time, she appeared to have a small pelvic abscess near the dome of the bladder likely due to diverticular disease of the colon and underwent percutaneous drainage of the abscess.  She was discharged home on appropriate culture specific (E. Coli and Proteus) antibiotics.  She was supposed to follow up as an outpatient with Dr. Tresa Moore but did not.  He drain was removed by IR as an outpatient about 2 weeks after placement.  She presented to the hospital again now with complaints of recurrent infection with dysuria, hematuria, and fever and leukocytosis.  A repeat CT demonstrated what appears to be consistent with recurrent abscess formation although too small to drain.  She is on Zosyn per her prior culture results with sensitivities pending from her urine and blood cultures for this admission.  She is feeling improved since admission.   She denies a history of voiding or storage urinary symptoms, hematuria, UTIs, STDs, urolithiasis, GU malignancy/trauma/surgery.  Past Medical History:  Diagnosis Date  . Alcohol abuse    abuse- moderate years ago - states only drinks now on weekends -2 to 8 driinks   . Asthma   . COLONIC POLYPS, HX OF 04/05/2010  . DIVERTICULITIS, HX OF 04/05/2010  . DJD (degenerative joint disease)    right knee, mot to severe  . GERD (gastroesophageal reflux disease)    no meds  . Heart murmur    hx of   . Hyperlipidemia   . Hypertension   . Impaired glucose tolerance 12/06/2013  . Obesity (BMI 30-39.9)   . PALPITATIONS, HX OF 09/14/2007    Past Surgical History:  Procedure Laterality Date  . ABDOMINAL HYSTERECTOMY    . COLONOSCOPY WITH PROPOFOL N/A 07/25/2016   Procedure: COLONOSCOPY WITH PROPOFOL;  Surgeon: Carol Ada, MD;  Location: [facility];  Service: Endoscopy;  Laterality: N/A;  . colonscopy     x 2  . KNEE ARTHROSCOPY     left   . TOTAL KNEE ARTHROPLASTY  07/29/2011   Procedure: TOTAL KNEE ARTHROPLASTY;  Surgeon: Mauri Pole, MD;  Location: [facility];  Service: Orthopedics;  Laterality: Right;  . TOTAL KNEE ARTHROPLASTY Left 12/14/2012   Procedure: LEFT TOTAL KNEE ARTHROPLASTY;  Surgeon: Mauri Pole, MD;  Location: [facility];  Service: Orthopedics;  Laterality: Left;  Marland Kitchen VESICOVAGINAL FISTULA CLOSURE W/ TAH       Current Hospital Medications:  Home meds:  Current Meds  Medication Sig  . albuterol (PROVENTIL HFA;VENTOLIN HFA) 108 (90 Base) MCG/ACT inhaler Inhale 1-2 puffs into the lungs every 6 (six) hours as needed for wheezing.  Marland Kitchen atorvastatin (LIPITOR) 10 MG tablet Take 1 tablet (10 mg total) by mouth daily.  . Fluticasone-Salmeterol (ADVAIR DISKUS) 250-50 MCG/DOSE AEPB Inhale 1 puff into the lungs 2 (two) times daily.  Marland Kitchen losartan-hydrochlorothiazide (HYZAAR) 100-25 MG tablet Take 1 tablet by mouth every morning.    Scheduled Meds: . atorvastatin  10 mg Oral Daily  . docusate sodium  100 mg Oral BID  . [START ON 09/13/2016] hydrochlorothiazide  12.5 mg Oral Daily  . losartan  100 mg Oral Daily  . metroNIDAZOLE  500 mg Oral Q12H  . mometasone-formoterol  2 puff Inhalation BID  . psyllium  1 packet Oral Daily  Continuous Infusions: . piperacillin-tazobactam (ZOSYN)  IV 3.375 g (09/12/16 1021)   PRN Meds:.acetaminophen **OR** acetaminophen, albuterol, fentaNYL (SUBLIMAZE) injection, HYDROcodone-acetaminophen, ondansetron **OR** ondansetron (ZOFRAN) IV  Allergies:  Allergies  Allergen Reactions  . Morphine Itching  . Shrimp [Shellfish Allergy] Itching    Tongue burns  . Tramadol Other (See Comments)    confusion  . Latex Rash    Family History  Problem Relation Age of Onset  . Stroke Mother   . COPD Father   . Lymphoma Sister     Social History:  reports that she  quit smoking about 28 years ago. Her smoking use included Cigarettes. She has a 3.50 pack-year smoking history. She has never used smokeless tobacco. She reports that she does not drink alcohol or use drugs.  ROS: A complete review of systems was performed.  All systems are negative except for pertinent findings as noted.  Physical Exam:  Vital signs in last 24 hours: Temp:  [97.9 F (36.6 C)-98.5 F (36.9 C)] 98.5 F (36.9 C) (08/03 0631) Pulse Rate:  [85-112] 85 (08/03 0631) Resp:  [18-20] 18 (08/03 0631) BP: (112-156)/(71-99) 140/85 (08/03 0631) SpO2:  [97 %-100 %] 98 % (08/03 0812) Constitutional:  Alert and oriented, No acute distress Cardiovascular: Regular rate and rhythm, No JVD Respiratory: Normal respiratory effort, Lungs clear bilaterally GI: Abdomen is soft, nontender, nondistended, no abdominal masses, obese GU: No CVA tenderness Lymphatic: No lymphadenopathy Neurologic: Grossly intact, no focal deficits Psychiatric: Normal mood and affect  Laboratory Data:   Recent Labs  09/11/16 2012  WBC 16.5*  HGB 12.9  HCT 39.1  PLT 307     Recent Labs  09/11/16 2012  NA 137  K 2.9*  CL 102  GLUCOSE 110*  BUN 12  CALCIUM 9.1  CREATININE 0.89     Results for orders placed or performed during the hospital encounter of 09/11/16 (from the past 24 hour(s))  Basic metabolic panel     Status: Abnormal   Collection Time: 09/11/16  8:12 PM  Result Value Ref Range   Sodium 137 135 - 145 mmol/L   Potassium 2.9 (L) 3.5 - 5.1 mmol/L   Chloride 102 101 - 111 mmol/L   CO2 27 22 - 32 mmol/L   Glucose, Bld 110 (H) 65 - 99 mg/dL   BUN 12 6 - 20 mg/dL   Creatinine, Ser 0.89 0.44 - 1.00 mg/dL   Calcium 9.1 8.9 - 10.3 mg/dL   GFR calc non Af Amer >60 >60 mL/min   GFR calc Af Amer >60 >60 mL/min   Anion gap 8 5 - 15  CBC     Status: Abnormal   Collection Time: 09/11/16  8:12 PM  Result Value Ref Range   WBC 16.5 (H) 4.0 - 10.5 K/uL   RBC 4.47 3.87 - 5.11 MIL/uL    Hemoglobin 12.9 12.0 - 15.0 g/dL   HCT 39.1 36.0 - 46.0 %   MCV 87.5 78.0 - 100.0 fL   MCH 28.9 26.0 - 34.0 pg   MCHC 33.0 30.0 - 36.0 g/dL   RDW 14.7 11.5 - 15.5 %   Platelets 307 150 - 400 K/uL  Urinalysis, Routine w reflex microscopic- may I&O cath if menses     Status: Abnormal   Collection Time: 09/11/16  8:16 PM  Result Value Ref Range   Color, Urine YELLOW YELLOW   APPearance HAZY (A) CLEAR   Specific Gravity, Urine 1.023 1.005 - 1.030   pH 5.0 5.0 - 8.0  Glucose, UA NEGATIVE NEGATIVE mg/dL   Hgb urine dipstick MODERATE (A) NEGATIVE   Bilirubin Urine NEGATIVE NEGATIVE   Ketones, ur NEGATIVE NEGATIVE mg/dL   Protein, ur 100 (A) NEGATIVE mg/dL   Nitrite NEGATIVE NEGATIVE   Leukocytes, UA LARGE (A) NEGATIVE   RBC / HPF TOO NUMEROUS TO COUNT 0 - 5 RBC/hpf   WBC, UA TOO NUMEROUS TO COUNT 0 - 5 WBC/hpf   Bacteria, UA RARE (A) NONE SEEN   Squamous Epithelial / LPF 0-5 (A) NONE SEEN   Mucous PRESENT   Differential     Status: Abnormal   Collection Time: 09/12/16 12:30 AM  Result Value Ref Range   Neutrophils Relative % 75 %   Neutro Abs 11.6 (H) 1.7 - 7.7 K/uL   Lymphocytes Relative 18 %   Lymphs Abs 2.8 0.7 - 4.0 K/uL   Monocytes Relative 6 %   Monocytes Absolute 0.9 0.1 - 1.0 K/uL   Eosinophils Relative 1 %   Eosinophils Absolute 0.2 0.0 - 0.7 K/uL   Basophils Relative 0 %   Basophils Absolute 0.0 0.0 - 0.1 K/uL  I-Stat CG4 Lactic Acid, ED     Status: None   Collection Time: 09/12/16 12:49 AM  Result Value Ref Range   Lactic Acid, Venous 1.13 0.5 - 1.9 mmol/L  Wet prep, genital     Status: Abnormal   Collection Time: 09/12/16 12:50 AM  Result Value Ref Range   Yeast Wet Prep HPF POC NONE SEEN NONE SEEN   Trich, Wet Prep NONE SEEN NONE SEEN   Clue Cells Wet Prep HPF POC PRESENT (A) NONE SEEN   WBC, Wet Prep HPF POC MANY (A) NONE SEEN   Sperm NONE SEEN   Sedimentation rate     Status: Abnormal   Collection Time: 09/12/16  6:20 AM  Result Value Ref Range   Sed  Rate 37 (H) 0 - 22 mm/hr   Recent Results (from the past 240 hour(s))  Wet prep, genital     Status: Abnormal   Collection Time: 09/12/16 12:50 AM  Result Value Ref Range Status   Yeast Wet Prep HPF POC NONE SEEN NONE SEEN Final   Trich, Wet Prep NONE SEEN NONE SEEN Final   Clue Cells Wet Prep HPF POC PRESENT (A) NONE SEEN Final   WBC, Wet Prep HPF POC MANY (A) NONE SEEN Final   Sperm NONE SEEN  Final    Renal Function:  Recent Labs  09/11/16 2012  CREATININE 0.89   CrCl cannot be calculated (Unknown ideal weight.).  Radiologic Imaging: Ct Abdomen Pelvis W Contrast  Result Date: 09/12/2016 CLINICAL DATA:  Initial evaluation for acute abdominal pain, fever, suspected abscess. EXAM: CT ABDOMEN AND PELVIS WITH CONTRAST TECHNIQUE: Multidetector CT imaging of the abdomen and pelvis was performed using the standard protocol following bolus administration of intravenous contrast. CONTRAST:  110mL ISOVUE-300 IOPAMIDOL (ISOVUE-300) INJECTION 61% COMPARISON:  Prior CT from 08/12/2016. FINDINGS: Lower chest: Mild scattered atelectatic changes present within the lung bases. Visualized lungs are otherwise clear. Hepatobiliary: 2 vague hypodense lesions noted within the left and right hepatic lobes, stable from previous, and most likely hemangioma S. liver otherwise unremarkable. Gallbladder within normal limits. No biliary dilatation. Pancreas: Pancreas within normal limits. Spleen: Spleen within normal limits. Adrenals/Urinary Tract: Adrenal glands are normal. Kidneys equal in size with symmetric enhancement. Punctate nonobstructive left renal nephrolithiasis noted. No hydronephrosis. No focal enhancing renal mass. No hydroureter. Bladder largely decompressed without abnormality. Stomach/Bowel: Stomach within normal limits.  No evidence for bowel obstruction. Appendix normal. Sigmoid diverticula again noted. There has been interval removal of a percutaneous drainage catheter. Persistent soft tissue  density extends from the sigmoid colon towards the bladder dome, either reflecting phlegmonous change or possibly fibrous tissues. Now evident is a small hypodense collection positioned at the bladder dome measuring approximately 13 x 10 x 12 mm, best appreciated on coronal sequence (series 6, image 70). Finding consistent with recurrent early abscess. No other acute inflammatory changes seen about the bowels. Vascular/Lymphatic: Aorto bi-iliac atherosclerotic disease again noted. Normal intravascular enhancement seen throughout the intra-abdominal aorta and its branch vessels. No adenopathy. Reproductive: Uterus is absent. Ovaries within normal limits. No adnexal mass. Other: No free air or fluid. Small fat containing paraumbilical hernia noted. Musculoskeletal: No acute osseous abnormality. No worrisome lytic or blastic osseous lesions. Multilevel degenerative spondylolysis and facet arthrosis noted within the visualized spine IMPRESSION: 1. Interval removal of percutaneous drainage catheter with development of small approximate 13 mm hypodense collection at the bladder dome, concerning for recurrent/developing abscess. Persistent phlegmonous and/or fibrous soft tissue band extends from the adjacent sigmoid colon, suspected to reflect an underlying fistulous tract. 2. Additional ancillary findings as above, stable from previous. Electronically Signed   By: Jeannine Boga M.D.   On: 09/12/2016 04:01    I independently reviewed the above imaging studies.  Impression/Recommendation:  1) Pelvic abscess likely secondary to diverticular disease with possible colovesical fistula:  Continue antibiotic therapy and adjust per cultures.  If patient does not continue to objectively improve from a clinical standpoint over the next 48-72 hours, she may need repeat imaging to see if abscess formation has progressed to a collection that could be drained.  It is unclear if she has an actual fistula to her bladder as  she did not follow up with Dr. Tresa Moore.  She will need to continue antibiotics and follow up with Dr. Tresa Moore in 1-2 weeks after discharge for cystoscopy and further evaluation of possible fistula.  Adylin Hankey,LES 09/12/2016, 12:51 PM  Pryor Curia. MD   CC: Dr. Wynetta Emery

## 2016-09-12 NOTE — Progress Notes (Signed)
Received patient from ED accompanied by RN. Patient AOx4, ambulatory, VS stable with slightly elevated BP at 140/85, O2Sat at 97% on RA and pain at 3/10. Patient ordered breakfast and then went to take a bath.  Will endorse to day shift RN appropriately.

## 2016-09-12 NOTE — ED Notes (Signed)
Patient has been to the bathroom to void multiple times.

## 2016-09-12 NOTE — Progress Notes (Signed)
Request for drain placement for a bladder abscess has been seen and reviewed by Dr. Anselm Pancoast.  She does not have a collection large enough to drain.  This is mostly phlegmonous in nature.  It also appears that the connection may be to her fallopian tube as opposed to her colon per Dr. Anselm Pancoast.  I have discussed with Dr. Wynetta Emery that this patient is not amenable to perc drainage.  Kimberlee Shoun E 9:22 AM 09/12/2016

## 2016-09-12 NOTE — Progress Notes (Addendum)
PROGRESS NOTE  [name]  LEX:[id number]  DOB: 1960-02-18  DOA: 09/11/2016 PCP: Biagio Borg, MD  Brief Admission Hx: [name] is a 56 y.o. female with medical history significant of HTN, HLD, asthma, morbid obesity, h/o diverticulitis; who presented with complaints of two-days of pain with urination. Patient reports significant pain with urination and increased frequency. However, notes that yesterday evening she complained of lower abdominal pain mild nausea and cramps. While urinating she passed gross blood with clots as though she were on her menstrual cycle although she's not had a period in many years. Patient is status post partial hysterectomy for fibroid tumors. She admits to recent sexual intercourse in the last week, but has never had symptoms like this in the past. Denies any fever, weight loss, chest pain, or shortness of breath. Patient states that she just recently had her colonoscopy on 07/25/2016, which showed diverticulosis.  She was admitted into the hospital from 6/18-6/23 for dysuria found to have a bladder abscess for which he underwent placement of a percutaneous drain by interventional radiology. She was treated antibiotics and cultures grew out Proteus and Escherichia coli sensitive to Rocephin. 2 weeks after discharge patient followed up with interventional radiology, had the drain removed, and the area had healed without issue. Patient was not able to follow-up with Jeffie Pollock of urology as advised at her last discharge.  MDM/Assessment & Plan:   Sepsis secondary to recurrent abscess of the bladder with colovesical fistula: Acute. Patient presents with complaints of dysuria. Initially tachycardic with WBC elevated at 16.5. Lactic acid was reassuring at 1.13. UA positive for signs of infection. Previously diagnosed with Escherichia coli and Proteus during last admission. Patient was placed on broad-spectrum antibiotics of Zosyn. Review records shows the 5 polyps that  were removed during her last colonoscopy with Dr. Benson Norway on 07/25/2016 and diverticulitis. - Follow-up GC chlamydia probe and urine culture - Continue empiric antibiotics of Zosyn - Fentanyl IV when necessary pain  - Interventional radiology consult ordered for evaluation for possible need of drain placement  - urology and gen surgery consulted.  Pt will need to follow up with Dr. Tresa Moore in 1-2 weeks after discharge for cystoscopy.  Pt says that she followed up with Dr. Marcello Moores but decided against surgery.    Hypokalemia: Acute. Initial potassium 2.9 on admission. Patient given 40 mEq of potassium chloride in the ED. Suspected to be secondary to blood pressure medication and/or fistula. - Continue to monitor and replace as needed  Vaginal bleeding with H/O hysterectomy: Patient complained of vaginal bleeding with clots as though she were on her menstrual cycle. Reports recent sexual activity in the last week. However, patient with history of hysterectomy, but still has her ovaries. No significant vaginal abnormalities noted. In the ED vaginal exam was unable to reveal clear source of bleeding. On the differential is vaginal wall tear from sexual intercourse vs other. Patient unsure of her last Pap smear. - I called and discussed case with Gyn on call and they reviewed her case and recommended treating her vaginitis which can also cause bleeding.  Says there is nothing different that they would do now.  Follow Hg. Pt says this morning that bleeding has stopped.   Asthma, without acute exacerbation  - Continue pharmacy substitution of Dulera for Advair and albuterol as needed  Essential hypertension - Continue Hyzaar  Hyperlipidemia - Continue Lipitor   Bacterial vaginosis .   Morbidly obesity: BMI  DVT prophylaxis:SCDs for now Code Status:  full  Family Communication: No family present at bedside Disposition Plan: Likely discharged home once medically stable Consults called: none    Admission status: Inpatient   Subjective: Pt says she is starting to feel better.   Objective: Vitals:   09/12/16 0430 09/12/16 0543 09/12/16 0631 09/12/16 0812  BP: 123/71 134/78 140/85   Pulse: 90 92 85   Resp:  20 18   Temp:   98.5 F (36.9 C)   TempSrc:   Oral   SpO2: 100%  97% 98%    Intake/Output Summary (Last 24 hours) at 09/12/16 1041 Last data filed at 09/12/16 0345  Gross per 24 hour  Intake             1050 ml  Output                0 ml  Net             1050 ml   There were no vitals filed for this visit.  REVIEW OF SYSTEMS  As per history otherwise all reviewed and reported negative  Exam:  General exam: awake, alert, NAD. Cooperative.  Respiratory system:  increased work of breathing. Cardiovascular system: S1 & S2 heard, RRR. No JVD, murmurs, gallops. Gastrointestinal system: Abdomen is obese, soft and no masses. Normal bowel sounds heard. Central nervous system: Alert and oriented. No focal neurological deficits. Extremities: no clubbing.   Data Reviewed: Basic Metabolic Panel:  Recent Labs Lab 09/11/16 2012  NA 137  K 2.9*  CL 102  CO2 27  GLUCOSE 110*  BUN 12  CREATININE 0.89  CALCIUM 9.1   Liver Function Tests: No results for input(s): AST, ALT, ALKPHOS, BILITOT, PROT, ALBUMIN in the last 168 hours. No results for input(s): LIPASE, AMYLASE in the last 168 hours. No results for input(s): AMMONIA in the last 168 hours. CBC:  Recent Labs Lab 09/11/16 2012 09/12/16 0030  WBC 16.5*  --   NEUTROABS  --  11.6*  HGB 12.9  --   HCT 39.1  --   MCV 87.5  --   PLT 307  --    Cardiac Enzymes: No results for input(s): CKTOTAL, CKMB, CKMBINDEX, TROPONINI in the last 168 hours. CBG (last 3)  No results for input(s): GLUCAP in the last 72 hours. Recent Results (from the past 240 hour(s))  Wet prep, genital     Status: Abnormal   Collection Time: 09/12/16 12:50 AM  Result Value Ref Range Status   Yeast Wet Prep HPF POC NONE SEEN NONE  SEEN Final   Trich, Wet Prep NONE SEEN NONE SEEN Final   Clue Cells Wet Prep HPF POC PRESENT (A) NONE SEEN Final   WBC, Wet Prep HPF POC MANY (A) NONE SEEN Final   Sperm NONE SEEN  Final     Studies: Ct Abdomen Pelvis W Contrast  Result Date: 09/12/2016 CLINICAL DATA:  Initial evaluation for acute abdominal pain, fever, suspected abscess. EXAM: CT ABDOMEN AND PELVIS WITH CONTRAST TECHNIQUE: Multidetector CT imaging of the abdomen and pelvis was performed using the standard protocol following bolus administration of intravenous contrast. CONTRAST:  158mL ISOVUE-300 IOPAMIDOL (ISOVUE-300) INJECTION 61% COMPARISON:  Prior CT from 08/12/2016. FINDINGS: Lower chest: Mild scattered atelectatic changes present within the lung bases. Visualized lungs are otherwise clear. Hepatobiliary: 2 vague hypodense lesions noted within the left and right hepatic lobes, stable from previous, and most likely hemangioma S. liver otherwise unremarkable. Gallbladder within normal limits. No biliary dilatation. Pancreas: Pancreas within normal  limits. Spleen: Spleen within normal limits. Adrenals/Urinary Tract: Adrenal glands are normal. Kidneys equal in size with symmetric enhancement. Punctate nonobstructive left renal nephrolithiasis noted. No hydronephrosis. No focal enhancing renal mass. No hydroureter. Bladder largely decompressed without abnormality. Stomach/Bowel: Stomach within normal limits. No evidence for bowel obstruction. Appendix normal. Sigmoid diverticula again noted. There has been interval removal of a percutaneous drainage catheter. Persistent soft tissue density extends from the sigmoid colon towards the bladder dome, either reflecting phlegmonous change or possibly fibrous tissues. Now evident is a small hypodense collection positioned at the bladder dome measuring approximately 13 x 10 x 12 mm, best appreciated on coronal sequence (series 6, image 70). Finding consistent with recurrent early abscess. No other  acute inflammatory changes seen about the bowels. Vascular/Lymphatic: Aorto bi-iliac atherosclerotic disease again noted. Normal intravascular enhancement seen throughout the intra-abdominal aorta and its branch vessels. No adenopathy. Reproductive: Uterus is absent. Ovaries within normal limits. No adnexal mass. Other: No free air or fluid. Small fat containing paraumbilical hernia noted. Musculoskeletal: No acute osseous abnormality. No worrisome lytic or blastic osseous lesions. Multilevel degenerative spondylolysis and facet arthrosis noted within the visualized spine IMPRESSION: 1. Interval removal of percutaneous drainage catheter with development of small approximate 13 mm hypodense collection at the bladder dome, concerning for recurrent/developing abscess. Persistent phlegmonous and/or fibrous soft tissue band extends from the adjacent sigmoid colon, suspected to reflect an underlying fistulous tract. 2. Additional ancillary findings as above, stable from previous. Electronically Signed   By: Jeannine Boga M.D.   On: 09/12/2016 04:01   Scheduled Meds: . atorvastatin  10 mg Oral Daily  . docusate sodium  100 mg Oral BID  . hydrochlorothiazide  25 mg Oral Daily  . losartan  100 mg Oral Daily  . metroNIDAZOLE  500 mg Oral Q12H  . mometasone-formoterol  2 puff Inhalation BID  . psyllium  1 packet Oral Daily   Continuous Infusions: . piperacillin-tazobactam (ZOSYN)  IV 3.375 g (09/12/16 1021)   Principal Problem:   Sepsis ([place]) Active Problems:   Vaginal bleeding   Severe obesity (BMI >= 40) ([facility])   Asthma   Abscess of bladder   Colovesical fistula  Time spent:   Irwin Brakeman, MD, FAAFP [facility] Pager [phone]  If 7PM-7AM, please contact night-coverage [email or web address] Password [email or web address] 09/12/2016, 10:41 AM    LOS: 0 days

## 2016-09-12 NOTE — H&P (Addendum)
History and Physical    [name]:[id number] DOB: 1960-05-09 DOA: 09/11/2016  Referring MD/NP/PA: Dr. Cyril Mourning Ward PCP: Biagio Borg, MD  Patient coming from: Home  Chief Complaint: Pain with urinating  HPI: [name] is a 56 y.o. female with medical history significant of HTN, HLD, asthma, morbid obesity, h/o diverticulitis; who presented with complaints of two-days of pain with urination. Patient reports significant pain with urination and increased frequency. However, notes that yesterday evening she complained of lower abdominal pain mild nausea and cramps. While urinating she passed gross blood with clots as though she were on her menstrual cycle although she's not had a period in many years. Patient is status post partial hysterectomy for fibroid tumors. She admits to recent sexual intercourse in the last week, but has never had symptoms like this in the past. Denies any fever, weight loss, chest pain, or shortness of breath. Patient states that she just recently had her colonoscopy on 07/25/2016, which showed diverticulosis.  She was admitted into the hospital from 6/18-6/23 for dysuria found to have a bladder abscess for which he underwent placement of a percutaneous drain by interventional radiology. She was treated antibiotics and cultures grew out Proteus and Escherichia coli sensitive to Rocephin. 2 weeks after discharge patient followed up with interventional radiology, had the drain removed, and the area had healed without issue. Patient was not able to follow-up with Jeffie Pollock of urology as advised at her last discharge.   ED Course: Upon admission into the emergency department patient was seen to be afebrile, pulse 86-112, respiration 18-20, and all other vital signs maintained. Labs revealed WBC 16.5, potassium 2.9, and lactic acid 1.13. Patient underwent pelvic exam for which revealed vaginal bleeding was noted, but no clear source was found. CT scan of the abdomen  revealed signs of a fistula with recurrent formation of abscess approximately 13 x 10 x 12 mm developing abscess. Urinalysis was positive for signs of infection. Patient was placed on Zosyn and cultures sent.  Review of Systems: Review of Systems  Constitutional: Negative for chills and fever.  HENT: Negative for ear discharge and nosebleeds.   Eyes: Negative for photophobia and pain.  Respiratory: Negative for sputum production and shortness of breath.   Cardiovascular: Negative for chest pain, palpitations and claudication.  Gastrointestinal: Positive for abdominal pain and nausea. Negative for vomiting.  Genitourinary: Positive for dysuria and frequency.       Positive for vaginal bleeding  Musculoskeletal: Negative for falls and myalgias.  Skin: Negative for itching and rash.  Neurological: Negative for speech change and focal weakness.  Endo/Heme/Allergies: Negative for polydipsia.  Psychiatric/Behavioral: Negative for hallucinations and substance abuse.    Past Medical History:  Diagnosis Date  . Alcohol abuse    abuse- moderate years ago - states only drinks now on weekends -2 to 8 driinks   . Asthma   . COLONIC POLYPS, HX OF 04/05/2010  . DIVERTICULITIS, HX OF 04/05/2010  . DJD (degenerative joint disease)    right knee, mot to severe  . GERD (gastroesophageal reflux disease)    no meds  . Heart murmur    hx of   . Hyperlipidemia   . Hypertension   . Impaired glucose tolerance 12/06/2013  . Obesity (BMI 30-39.9)   . PALPITATIONS, HX OF 09/14/2007    Past Surgical History:  Procedure Laterality Date  . ABDOMINAL HYSTERECTOMY    . COLONOSCOPY WITH PROPOFOL N/A 07/25/2016   Procedure: COLONOSCOPY WITH PROPOFOL;  Surgeon:  Carol Ada, MD;  Location: [facility];  Service: Endoscopy;  Laterality: N/A;  . colonscopy     x 2  . KNEE ARTHROSCOPY     left   . TOTAL KNEE ARTHROPLASTY  07/29/2011   Procedure: TOTAL KNEE ARTHROPLASTY;  Surgeon: Mauri Pole, MD;  Location:  [facility];  Service: Orthopedics;  Laterality: Right;  . TOTAL KNEE ARTHROPLASTY Left 12/14/2012   Procedure: LEFT TOTAL KNEE ARTHROPLASTY;  Surgeon: Mauri Pole, MD;  Location: [facility];  Service: Orthopedics;  Laterality: Left;  Marland Kitchen VESICOVAGINAL FISTULA CLOSURE W/ TAH       reports that she quit smoking about 28 years ago. Her smoking use included Cigarettes. She has a 3.50 pack-year smoking history. She has never used smokeless tobacco. She reports that she does not drink alcohol or use drugs.  Allergies  Allergen Reactions  . Morphine Itching  . Shrimp [Shellfish Allergy] Itching    Tongue burns  . Tramadol Other (See Comments)    confusion  . Latex Rash    Family History  Problem Relation Age of Onset  . Stroke Mother   . COPD Father   . Lymphoma Sister     Prior to Admission medications   Medication Sig Start Date End Date Taking? Authorizing Provider  albuterol (PROVENTIL HFA;VENTOLIN HFA) 108 (90 Base) MCG/ACT inhaler Inhale 1-2 puffs into the lungs every 6 (six) hours as needed for wheezing. 04/18/16   Larene Pickett, PA-C  atorvastatin (LIPITOR) 10 MG tablet Take 1 tablet (10 mg total) by mouth daily. 12/07/15   Biagio Borg, MD  Fluticasone-Salmeterol (ADVAIR DISKUS) 250-50 MCG/DOSE AEPB Inhale 1 puff into the lungs 2 (two) times daily. 04/07/16   Biagio Borg, MD  losartan-hydrochlorothiazide (HYZAAR) 100-25 MG tablet Take 1 tablet by mouth every morning. 12/07/15   Biagio Borg, MD  predniSONE (DELTASONE) 10 MG tablet 3 tabs by mouth per day for 3 days,2tabs per day for 3 days,1tab per day for 3 days 08/29/16   Biagio Borg, MD    Physical Exam:  Constitutional: NAD, calm, comfortable Vitals:   09/12/16 0130 09/12/16 0200 09/12/16 0230 09/12/16 0424  BP: 123/75 131/74 132/84 (!) 146/85  Pulse: 92 94 86 99  Resp:    20  Temp:      TempSrc:      SpO2: 97% 99% 99% 97%   Eyes: PERRL, lids and conjunctivae normal ENMT: Mucous membranes are moist. Posterior pharynx  clear of any exudate or lesions.Normal dentition.  Neck: normal, supple, no masses, no thyromegaly Respiratory: clear to auscultation bilaterally, no wheezing, no crackles. Normal respiratory effort. No accessory muscle use.  Cardiovascular: Regular rate and rhythm, no murmurs / rubs / gallops. No extremity edema. 2+ pedal pulses. No carotid bruits.  Abdomen: no tenderness, no masses palpated. No hepatosplenomegaly. Bowel sounds positive.  Musculoskeletal: no clubbing / cyanosis. No joint deformity upper and lower extremities. Good ROM, no contractures. Normal muscle tone.  Skin: no rashes, lesions, ulcers. No induration Neurologic: CN 2-12 grossly intact. Sensation intact, DTR normal. Strength 5/5 in all 4.  Psychiatric: Normal judgment and insight. Alert and oriented x 3. Normal mood.     Labs on Admission: I have personally reviewed following labs and imaging studies  CBC:  Recent Labs Lab 09/11/16 2012 09/12/16 0030  WBC 16.5*  --   NEUTROABS  --  11.6*  HGB 12.9  --   HCT 39.1  --   MCV 87.5  --  PLT 307  --    Basic Metabolic Panel:  Recent Labs Lab 09/11/16 2012  NA 137  K 2.9*  CL 102  CO2 27  GLUCOSE 110*  BUN 12  CREATININE 0.89  CALCIUM 9.1   GFR: CrCl cannot be calculated (Unknown ideal weight.). Liver Function Tests: No results for input(s): AST, ALT, ALKPHOS, BILITOT, PROT, ALBUMIN in the last 168 hours. No results for input(s): LIPASE, AMYLASE in the last 168 hours. No results for input(s): AMMONIA in the last 168 hours. Coagulation Profile: No results for input(s): INR, PROTIME in the last 168 hours. Cardiac Enzymes: No results for input(s): CKTOTAL, CKMB, CKMBINDEX, TROPONINI in the last 168 hours. BNP (last 3 results) No results for input(s): PROBNP in the last 8760 hours. HbA1C: No results for input(s): HGBA1C in the last 72 hours. CBG: No results for input(s): GLUCAP in the last 168 hours. Lipid Profile: No results for input(s): CHOL,  HDL, LDLCALC, TRIG, CHOLHDL, LDLDIRECT in the last 72 hours. Thyroid Function Tests: No results for input(s): TSH, T4TOTAL, FREET4, T3FREE, THYROIDAB in the last 72 hours. Anemia Panel: No results for input(s): VITAMINB12, FOLATE, FERRITIN, TIBC, IRON, RETICCTPCT in the last 72 hours. Urine analysis:    Component Value Date/Time   COLORURINE YELLOW 09/11/2016 2016   APPEARANCEUR HAZY (A) 09/11/2016 2016   LABSPEC 1.023 09/11/2016 2016   PHURINE 5.0 09/11/2016 2016   GLUCOSEU NEGATIVE 09/11/2016 2016   GLUCOSEU NEGATIVE 07/25/2016 0905   HGBUR MODERATE (A) 09/11/2016 2016   BILIRUBINUR NEGATIVE 09/11/2016 2016   BILIRUBINUR negative 09/16/2011 [place] 09/11/2016 2016   PROTEINUR 100 (A) 09/11/2016 2016   UROBILINOGEN 0.2 07/25/2016 0905   NITRITE NEGATIVE 09/11/2016 2016   LEUKOCYTESUR LARGE (A) 09/11/2016 2016   Sepsis Labs: Recent Results (from the past 240 hour(s))  Wet prep, genital     Status: Abnormal   Collection Time: 09/12/16 12:50 AM  Result Value Ref Range Status   Yeast Wet Prep HPF POC NONE SEEN NONE SEEN Final   Trich, Wet Prep NONE SEEN NONE SEEN Final   Clue Cells Wet Prep HPF POC PRESENT (A) NONE SEEN Final   WBC, Wet Prep HPF POC MANY (A) NONE SEEN Final   Sperm NONE SEEN  Final     Radiological Exams on Admission: Ct Abdomen Pelvis W Contrast  Result Date: 09/12/2016 CLINICAL DATA:  Initial evaluation for acute abdominal pain, fever, suspected abscess. EXAM: CT ABDOMEN AND PELVIS WITH CONTRAST TECHNIQUE: Multidetector CT imaging of the abdomen and pelvis was performed using the standard protocol following bolus administration of intravenous contrast. CONTRAST:  145m ISOVUE-300 IOPAMIDOL (ISOVUE-300) INJECTION 61% COMPARISON:  Prior CT from 08/12/2016. FINDINGS: Lower chest: Mild scattered atelectatic changes present within the lung bases. Visualized lungs are otherwise clear. Hepatobiliary: 2 vague hypodense lesions noted within the left and  right hepatic lobes, stable from previous, and most likely hemangioma S. liver otherwise unremarkable. Gallbladder within normal limits. No biliary dilatation. Pancreas: Pancreas within normal limits. Spleen: Spleen within normal limits. Adrenals/Urinary Tract: Adrenal glands are normal. Kidneys equal in size with symmetric enhancement. Punctate nonobstructive left renal nephrolithiasis noted. No hydronephrosis. No focal enhancing renal mass. No hydroureter. Bladder largely decompressed without abnormality. Stomach/Bowel: Stomach within normal limits. No evidence for bowel obstruction. Appendix normal. Sigmoid diverticula again noted. There has been interval removal of a percutaneous drainage catheter. Persistent soft tissue density extends from the sigmoid colon towards the bladder dome, either reflecting phlegmonous change or possibly fibrous tissues. Now  evident is a small hypodense collection positioned at the bladder dome measuring approximately 13 x 10 x 12 mm, best appreciated on coronal sequence (series 6, image 70). Finding consistent with recurrent early abscess. No other acute inflammatory changes seen about the bowels. Vascular/Lymphatic: Aorto bi-iliac atherosclerotic disease again noted. Normal intravascular enhancement seen throughout the intra-abdominal aorta and its branch vessels. No adenopathy. Reproductive: Uterus is absent. Ovaries within normal limits. No adnexal mass. Other: No free air or fluid. Small fat containing paraumbilical hernia noted. Musculoskeletal: No acute osseous abnormality. No worrisome lytic or blastic osseous lesions. Multilevel degenerative spondylolysis and facet arthrosis noted within the visualized spine IMPRESSION: 1. Interval removal of percutaneous drainage catheter with development of small approximate 13 mm hypodense collection at the bladder dome, concerning for recurrent/developing abscess. Persistent phlegmonous and/or fibrous soft tissue band extends from the  adjacent sigmoid colon, suspected to reflect an underlying fistulous tract. 2. Additional ancillary findings as above, stable from previous. Electronically Signed   By: Jeannine Boga M.D.   On: 09/12/2016 04:01    CT imaging Independently reviewed: Visualized fistulous tract with abscess formation.  Assessment/Plan Sepsis secondary to recurrent abscess of the bladder with colovesical fistula: Acute. Patient presents with complaints of dysuria. Initially tachycardic with WBC elevated at 16.5. Lactic acid was reassuring at 1.13. UA positive for signs of infection. Previously diagnosed with Escherichia coli and Proteus during last admission. Patient was placed on broad-spectrum antibiotics of Zosyn. Review records shows the 5 polyps that were removed during her last colonoscopy with Dr. Benson Norway on 07/25/2016 and diverticulitis. - Admit to a MedSurg bed - Follow-up GC chlamydia probe and urine culture - Continue empiric antibiotics of Zosyn - Check ESR. Question underlying malignancy as possible cause for recurrent fistula. - Fentanyl IV when necessary pain  - Interventional radiology consult ordered for evaluation for possible need of drain placement  - May want to consult urology during this patient has patient had followed up as previously advised  Hypokalemia: Acute. Initial potassium 2.9 on admission. Patient given 40 mEq of potassium chloride in the ED. Suspected to be secondary to blood pressure medication and/or fistula. - Continue to monitor and replace as needed  Vaginal bleeding with H/O hysterectomy: Patient complained of vaginal bleeding with clots as though she were on her menstrual cycle. Reports recent sexual activity in the last week. However, patient with history of hysterectomy, but still has her ovaries. No significant vaginal abnormalities noted. In the ED vaginal exam was unable to reveal clear source of bleeding. On the differential is vaginal wall tear from sexual intercourse  vs other. Patient unsure of her last Pap smear. - May warrant consult to OB/GYN in a.m.  Asthma, without acute exacerbation  - Continue pharmacy substitution of Dulera for Advair and albuterol as needed  Essential hypertension - Continue Hyzaar  Hyperlipidemia - Continue Lipitor   Bacterial vaginosis - covered by [facility]  Morbidly obesity: BMI   DVT prophylaxis:SCDs for now Code Status: full  Family Communication: No family present at bedside Disposition Plan: Likely discharged home once medically stable Consults called: none  Admission status: Inpatient   Norval Morton MD [facility] Pager [phone]   If 7PM-7AM, please contact night-coverage [email or web address] Password [email or web address]  09/12/2016, 4:32 AM

## 2016-09-12 NOTE — ED Notes (Signed)
Attempted to give report to Celsa; in the middle of a dressing change will call me back.

## 2016-09-12 NOTE — ED Notes (Signed)
Attempted to call report; nurse Celsa requested "two more minutes" to return call.

## 2016-09-12 NOTE — Consult Note (Signed)
[facility] Surgery Consult Note  ALLESHA ARONOFF August 29, 1960  [id number].    Requesting MD: Wynetta Emery, MD Chief Complaint/Reason for Consult: possible colovesical fistula, dysuria   HPI:  [name] is a 56 year old female with a medical history of hypertension, hyperlipidemia, morbid obesity, and diverticulitis who presented to [facility] emergency department with suprapubic pain with urination, urinary frequency, and vaginal bleeding. Patient reports suprapubic pain during urination that is sharp and non-radiating. Denies aggravating or alleviating factors. She states that last night she noticed blood with small clots dripping into the toilet while she was urinating and wiped to find a lot of blood on the toilet tissue. She presented to the emergency department for workup of vaginal bleeding. She reports that her abdominal hysterectomy was over 20 years ago and that many years after her hysterectomy she experienced vaginal spotting and was told that the bleeding was coming from the walls of her vagina and was not concerning. She is sexually active and denies recent changes in vaginal discharge or vaginal pain. She reports daily bowel movements that are soft and formed. She takes Metamucil and stool softeners in order to avoid constipation. She denies blood or pus in her stool. States that her last episode of diverticulitis was roughly 4 years ago and resolved with oral antibiotics.  She was previously admitted in June 2018 with dysuria, found to have an intramural bladder abscess that was drained by interventional radiology. Patient responded well to the antibiotic treatment with Rocephin and the drain was removed approximately 2 weeks later. During previous hospitalization there was a question of a colovesical fistula and general surgery was asked to consult. The patient followed up with Dr. Leighton Ruff approximately 1 week ago in our office and, after discussing surgical correction of colovesical  fistula, decided that she would hold off on scheduling surgery because there was a chance that complications from her fistula would not return. The patient's last colonoscopy was 07/25/16 performed by Dr. Benson Norway. During this colonoscopy 2 pedunculated polyps were removed from the sigmoid and transverse colon (tubular adenoma with high-grade dysplasia), and 3 sessile polyps (tubular adenoma without high-grade dysplasia) were removed from the descending and transverse colon.  ED workup significant for phlegmonous changes around the bladder on CT scan with suggestion of communication to the sigmoid colon. There is no appreciable sigmoid inflammation on CT. There is no drainable abscess or fluid collection. WBC 16.5. UTI with urine cultures pending.   ROS: Review of Systems  Constitutional: Negative for chills, fever and weight loss.  Respiratory: Negative for shortness of breath.   Cardiovascular: Negative for chest pain.  Gastrointestinal: Positive for abdominal pain (suprapubic). Negative for nausea and vomiting.  Genitourinary: Positive for frequency. Negative for dysuria.       Vaginal bleeding.  Psychiatric/Behavioral: The patient has insomnia.     Family History  Problem Relation Age of Onset  . Stroke Mother   . COPD Father   . Lymphoma Sister     Past Medical History:  Diagnosis Date  . Alcohol abuse    abuse- moderate years ago - states only drinks now on weekends -2 to 8 driinks   . Asthma   . COLONIC POLYPS, HX OF 04/05/2010  . DIVERTICULITIS, HX OF 04/05/2010  . DJD (degenerative joint disease)    right knee, mot to severe  . GERD (gastroesophageal reflux disease)    no meds  . Heart murmur    hx of   . Hyperlipidemia   . Hypertension   .  Impaired glucose tolerance 12/06/2013  . Obesity (BMI 30-39.9)   . PALPITATIONS, HX OF 09/14/2007    Past Surgical History:  Procedure Laterality Date  . ABDOMINAL HYSTERECTOMY    . COLONOSCOPY WITH PROPOFOL N/A 07/25/2016   Procedure:  COLONOSCOPY WITH PROPOFOL;  Surgeon: Carol Ada, MD;  Location: [facility] ENDOSCOPY;  Service: Endoscopy;  Laterality: N/A;  . colonscopy     x 2  . KNEE ARTHROSCOPY     left   . TOTAL KNEE ARTHROPLASTY  07/29/2011   Procedure: TOTAL KNEE ARTHROPLASTY;  Surgeon: Mauri Pole, MD;  Location: [facility] ORS;  Service: Orthopedics;  Laterality: Right;  . TOTAL KNEE ARTHROPLASTY Left 12/14/2012   Procedure: LEFT TOTAL KNEE ARTHROPLASTY;  Surgeon: Mauri Pole, MD;  Location: [facility] ORS;  Service: Orthopedics;  Laterality: Left;  Marland Kitchen VESICOVAGINAL FISTULA CLOSURE W/ TAH      Social History:  reports that she quit smoking about 28 years ago. Her smoking use included Cigarettes. She has a 3.50 pack-year smoking history. She has never used smokeless tobacco. She reports that she does not drink alcohol or use drugs.  Allergies:  Allergies  Allergen Reactions  . Morphine Itching  . Shrimp [Shellfish Allergy] Itching    Tongue burns  . Tramadol Other (See Comments)    confusion  . Latex Rash    Medications Prior to Admission  Medication Sig Dispense Refill  . albuterol (PROVENTIL HFA;VENTOLIN HFA) 108 (90 Base) MCG/ACT inhaler Inhale 1-2 puffs into the lungs every 6 (six) hours as needed for wheezing. 1 Inhaler 0  . atorvastatin (LIPITOR) 10 MG tablet Take 1 tablet (10 mg total) by mouth daily. 90 tablet 3  . Fluticasone-Salmeterol (ADVAIR DISKUS) 250-50 MCG/DOSE AEPB Inhale 1 puff into the lungs 2 (two) times daily. 3 each 2  . losartan-hydrochlorothiazide (HYZAAR) 100-25 MG tablet Take 1 tablet by mouth every morning. 90 tablet 3    Blood pressure 140/85, pulse 85, temperature 98.5 F (36.9 C), temperature source Oral, resp. rate 18, SpO2 98 %. Physical Exam: Physical Exam  Constitutional: She is oriented to person, place, and time. She appears well-developed and well-nourished. No distress.  HENT:  Head: Normocephalic and atraumatic.  Right Ear: External ear normal.  Eyes: EOM are normal. Right eye  exhibits no discharge. Left eye exhibits no discharge. No scleral icterus.  Neck: Normal range of motion. No tracheal deviation present.  Cardiovascular: Normal rate, regular rhythm, normal heart sounds and intact distal pulses.  Exam reveals no friction rub.   No murmur heard. Pulmonary/Chest: Effort normal and breath sounds normal. No stridor. No respiratory distress. She has no wheezes. She has no rales.  Abdominal: Soft. Bowel sounds are normal. She exhibits no distension and no mass. There is no tenderness. There is no rebound and no guarding. No hernia.  Musculoskeletal: Normal range of motion. She exhibits no edema, tenderness or deformity.  Neurological: She is alert and oriented to person, place, and time. No sensory deficit.  Skin: Skin is warm and dry. Capillary refill takes less than 2 seconds. She is not diaphoretic.  Psychiatric: She has a normal mood and affect. Her behavior is normal.    Results for orders placed or performed during the hospital encounter of 09/11/16 (from the past 48 hour(s))  Basic metabolic panel     Status: Abnormal   Collection Time: 09/11/16  8:12 PM  Result Value Ref Range   Sodium 137 135 - 145 mmol/L   Potassium 2.9 (L) 3.5 - 5.1 mmol/L  Chloride 102 101 - 111 mmol/L   CO2 27 22 - 32 mmol/L   Glucose, Bld 110 (H) 65 - 99 mg/dL   BUN 12 6 - 20 mg/dL   Creatinine, Ser 0.89 0.44 - 1.00 mg/dL   Calcium 9.1 8.9 - 10.3 mg/dL   GFR calc non Af Amer >60 >60 mL/min   GFR calc Af Amer >60 >60 mL/min    Comment: (NOTE) The eGFR has been calculated using the CKD EPI equation. This calculation has not been validated in all clinical situations. eGFR's persistently <60 mL/min signify possible Chronic Kidney Disease.    Anion gap 8 5 - 15  CBC     Status: Abnormal   Collection Time: 09/11/16  8:12 PM  Result Value Ref Range   WBC 16.5 (H) 4.0 - 10.5 K/uL   RBC 4.47 3.87 - 5.11 MIL/uL   Hemoglobin 12.9 12.0 - 15.0 g/dL   HCT 39.1 36.0 - 46.0 %   MCV  87.5 78.0 - 100.0 fL   MCH 28.9 26.0 - 34.0 pg   MCHC 33.0 30.0 - 36.0 g/dL   RDW 14.7 11.5 - 15.5 %   Platelets 307 150 - 400 K/uL  Urinalysis, Routine w reflex microscopic- may I&O cath if menses     Status: Abnormal   Collection Time: 09/11/16  8:16 PM  Result Value Ref Range   Color, Urine YELLOW YELLOW   APPearance HAZY (A) CLEAR   Specific Gravity, Urine 1.023 1.005 - 1.030   pH 5.0 5.0 - 8.0   Glucose, UA NEGATIVE NEGATIVE mg/dL   Hgb urine dipstick MODERATE (A) NEGATIVE   Bilirubin Urine NEGATIVE NEGATIVE   Ketones, ur NEGATIVE NEGATIVE mg/dL   Protein, ur 100 (A) NEGATIVE mg/dL   Nitrite NEGATIVE NEGATIVE   Leukocytes, UA LARGE (A) NEGATIVE   RBC / HPF TOO NUMEROUS TO COUNT 0 - 5 RBC/hpf   WBC, UA TOO NUMEROUS TO COUNT 0 - 5 WBC/hpf   Bacteria, UA RARE (A) NONE SEEN   Squamous Epithelial / LPF 0-5 (A) NONE SEEN   Mucous PRESENT   Differential     Status: Abnormal   Collection Time: 09/12/16 12:30 AM  Result Value Ref Range   Neutrophils Relative % 75 %   Neutro Abs 11.6 (H) 1.7 - 7.7 K/uL   Lymphocytes Relative 18 %   Lymphs Abs 2.8 0.7 - 4.0 K/uL   Monocytes Relative 6 %   Monocytes Absolute 0.9 0.1 - 1.0 K/uL   Eosinophils Relative 1 %   Eosinophils Absolute 0.2 0.0 - 0.7 K/uL   Basophils Relative 0 %   Basophils Absolute 0.0 0.0 - 0.1 K/uL  I-Stat CG4 Lactic Acid, ED     Status: None   Collection Time: 09/12/16 12:49 AM  Result Value Ref Range   Lactic Acid, Venous 1.13 0.5 - 1.9 mmol/L  Wet prep, genital     Status: Abnormal   Collection Time: 09/12/16 12:50 AM  Result Value Ref Range   Yeast Wet Prep HPF POC NONE SEEN NONE SEEN   Trich, Wet Prep NONE SEEN NONE SEEN   Clue Cells Wet Prep HPF POC PRESENT (A) NONE SEEN   WBC, Wet Prep HPF POC MANY (A) NONE SEEN   Sperm NONE SEEN   Sedimentation rate     Status: Abnormal   Collection Time: 09/12/16  6:20 AM  Result Value Ref Range   Sed Rate 37 (H) 0 - 22 mm/hr   Ct Abdomen Pelvis W  Contrast  Result  Date: 09/12/2016 CLINICAL DATA:  Initial evaluation for acute abdominal pain, fever, suspected abscess. EXAM: CT ABDOMEN AND PELVIS WITH CONTRAST TECHNIQUE: Multidetector CT imaging of the abdomen and pelvis was performed using the standard protocol following bolus administration of intravenous contrast. CONTRAST:  152m ISOVUE-300 IOPAMIDOL (ISOVUE-300) INJECTION 61% COMPARISON:  Prior CT from 08/12/2016. FINDINGS: Lower chest: Mild scattered atelectatic changes present within the lung bases. Visualized lungs are otherwise clear. Hepatobiliary: 2 vague hypodense lesions noted within the left and right hepatic lobes, stable from previous, and most likely hemangioma S. liver otherwise unremarkable. Gallbladder within normal limits. No biliary dilatation. Pancreas: Pancreas within normal limits. Spleen: Spleen within normal limits. Adrenals/Urinary Tract: Adrenal glands are normal. Kidneys equal in size with symmetric enhancement. Punctate nonobstructive left renal nephrolithiasis noted. No hydronephrosis. No focal enhancing renal mass. No hydroureter. Bladder largely decompressed without abnormality. Stomach/Bowel: Stomach within normal limits. No evidence for bowel obstruction. Appendix normal. Sigmoid diverticula again noted. There has been interval removal of a percutaneous drainage catheter. Persistent soft tissue density extends from the sigmoid colon towards the bladder dome, either reflecting phlegmonous change or possibly fibrous tissues. Now evident is a small hypodense collection positioned at the bladder dome measuring approximately 13 x 10 x 12 mm, best appreciated on coronal sequence (series 6, image 70). Finding consistent with recurrent early abscess. No other acute inflammatory changes seen about the bowels. Vascular/Lymphatic: Aorto bi-iliac atherosclerotic disease again noted. Normal intravascular enhancement seen throughout the intra-abdominal aorta and its branch vessels. No adenopathy.  Reproductive: Uterus is absent. Ovaries within normal limits. No adnexal mass. Other: No free air or fluid. Small fat containing paraumbilical hernia noted. Musculoskeletal: No acute osseous abnormality. No worrisome lytic or blastic osseous lesions. Multilevel degenerative spondylolysis and facet arthrosis noted within the visualized spine IMPRESSION: 1. Interval removal of percutaneous drainage catheter with development of small approximate 13 mm hypodense collection at the bladder dome, concerning for recurrent/developing abscess. Persistent phlegmonous and/or fibrous soft tissue band extends from the adjacent sigmoid colon, suspected to reflect an underlying fistulous tract. 2. Additional ancillary findings as above, stable from previous. Electronically Signed   By: BJeannine BogaM.D.   On: 09/12/2016 04:01   Assessment/Plan Hypertension Hyperlipidemia Morbid obesity Asthma Diverticulosis Vaginal bleeding - wet prep significant for WBC and clue cells; G/C pending; consider GYN consult.  Suprapubic pain Cystitis with phlegmonous changes - not amenable to percutaneous drainage by IR; urine CX pending; agree with urology consult.  Colovesical fistula  FEN: HH, hypokalemia (2.9) ID: Zosyn 8/3 >>, WBC 16.5  VTE: SCD's   Plan: IV antibiotic treatment for phlegmonous changes around bladder, suspect secondary to colovesical fistula. Agree with urology consult. Cystoscopy for evaluation of bladder wall and possible visualization of fistula might be recommended. While a fistulous connection between the bladder and fallopian tube is possible, I feel it is less likely than a colovesical fistula, especially considering patient's history of diverticulitis and high grade dysplasia on colonoscopy.   EJill Alexanders PNorth Texas State HospitalSurgery 09/12/2016, 10:28 AM Pager: [phone]: [phone]-Fri 7:00 am-4:30 pm Sat-Sun 7:00 am-11:30 am

## 2016-09-13 LAB — COMPREHENSIVE METABOLIC PANEL
ALT: 21 U/L (ref 14–54)
AST: 24 U/L (ref 15–41)
Albumin: 2.9 g/dL — ABNORMAL LOW (ref 3.5–5.0)
Alkaline Phosphatase: 90 U/L (ref 38–126)
Anion gap: 4 — ABNORMAL LOW (ref 5–15)
BUN: 15 mg/dL (ref 6–20)
CO2: 29 mmol/L (ref 22–32)
Calcium: 8.9 mg/dL (ref 8.9–10.3)
Chloride: 108 mmol/L (ref 101–111)
Creatinine, Ser: 1.15 mg/dL — ABNORMAL HIGH (ref 0.44–1.00)
GFR calc Af Amer: >60 mL/min (ref >60)
GFR calc non Af Amer: 53 mL/min — ABNORMAL LOW (ref >60)
Glucose, Bld: 117 mg/dL — ABNORMAL HIGH (ref 65–99)
Potassium: 3.7 mmol/L (ref 3.5–5.1)
Sodium: 141 mmol/L (ref 135–145)
Total Bilirubin: 0.4 mg/dL (ref 0.3–1.2)
Total Protein: 6.6 g/dL (ref 6.5–8.1)

## 2016-09-13 LAB — CBC
HCT: 38.7 % (ref 36.0–46.0)
Hemoglobin: 12.4 g/dL (ref 12.0–15.0)
MCH: 28.6 pg (ref 26.0–34.0)
MCHC: 32 g/dL (ref 30.0–36.0)
MCV: 89.4 fL (ref 78.0–100.0)
Platelets: 286 10*3/uL (ref 150–400)
RBC: 4.33 MIL/uL (ref 3.87–5.11)
RDW: 15.1 % (ref 11.5–15.5)
WBC: 10.5 10*3/uL (ref 4.0–10.5)

## 2016-09-13 LAB — MAGNESIUM: Magnesium: 2.3 mg/dL (ref 1.7–2.4)

## 2016-09-13 MED ORDER — POTASSIUM CHLORIDE CRYS ER 10 MEQ PO TBCR: 10.0000 meq | EXTENDED_RELEASE_TABLET | Freq: Two times a day (BID) | ORAL | Status: DC

## 2016-09-13 NOTE — Progress Notes (Signed)
PROGRESS NOTE  [name]  NTI:[id number]  DOB: [birth year]  DOA: 09/11/2016 PCP: Biagio Borg, MD  Brief Admission Hx: [name] is a 56 y.o. female with medical history significant of HTN, HLD, asthma, morbid obesity, h/o diverticulitis; who presented with complaints of two-days of pain with urination. Patient reports significant pain with urination and increased frequency. However, notes that yesterday evening she complained of lower abdominal pain mild nausea and cramps. While urinating she passed gross blood with clots as though she were on her menstrual cycle although she's not had a period in many years. Patient is status post partial hysterectomy for fibroid tumors. She admits to recent sexual intercourse in the last week, but has never had symptoms like this in the past. Denies any fever, weight loss, chest pain, or shortness of breath. Patient states that she just recently had her colonoscopy on 07/25/2016, which showed diverticulosis.  She was admitted into the hospital from 6/18-6/23 for dysuria found to have a bladder abscess for which he underwent placement of a percutaneous drain by interventional radiology. She was treated antibiotics and cultures grew out Proteus and Escherichia coli sensitive to Rocephin. 2 weeks after discharge patient followed up with interventional radiology, had the drain removed, and the area had healed without issue. Patient was not able to follow-up with Jeffie Pollock of urology as advised at her last discharge.  MDM/Assessment & Plan:   Sepsis secondary to recurrent abscess of the bladder with colovesical fistula: Acute. Patient presents with complaints of dysuria. Initially tachycardic with WBC elevated at 16.5. Lactic acid was reassuring at 1.13. UA positive for signs of infection. Previously diagnosed with Escherichia coli and Proteus during last admission. Patient was placed on broad-spectrum antibiotics of Zosyn. Review records shows the 5 polyps that  were removed during her last colonoscopy with Dr. Benson Norway on 07/25/2016 and diverticulitis. - GC chlamydia probe negative - Continue empiric antibiotics of Zosyn, switch to oral antibiotic tomorrow and likely to discharge home - Interventional radiology consult ordered for evaluation for possible need of drain placement  - urology and gen surgery consulted.  Pt will need to follow up with Dr. Tresa Moore in 1-2 weeks after discharge for cystoscopy.  Pt says that she followed up with Dr. Marcello Moores but decided against surgery.    Hypokalemia: Treated.  Initial potassium 2.9 on admission. Patient given 40 mEq of potassium chloride in the ED. Suspected to be secondary to blood pressure medication. Will discharge on lower dose of HCTZ.   - Continue to monitor and replace as needed  Vaginal bleeding with H/O hysterectomy: Patient complained of vaginal bleeding with clots as though she were on her menstrual cycle. Reports recent sexual activity in the last week. However, patient with history of hysterectomy, but still has her ovaries. No significant vaginal abnormalities noted. In the ED vaginal exam was unable to reveal clear source of bleeding. On the differential is vaginal wall tear from sexual intercourse vs other. Patient unsure of her last Pap smear. - I called and discussed case with Gyn on call and they reviewed her case and recommended treating her vaginitis which can also cause bleeding.  Says there is nothing different that they would do now.  Follow Hg. Pt says this morning that bleeding has stopped.   Asthma, without acute exacerbation  - Continue pharmacy substitution of Dulera for Advair and albuterol as needed  Essential hypertension - Continue Hyzaar  Hyperlipidemia - Continue Lipitor   Bacterial vaginosis  Morbidly obesity: BMI  DVT prophylaxis:SCDs for now Code Status: full  Family Communication: No family present at bedside Disposition Plan: Likely discharged home once medically  stable Consults called: none  Admission status: Inpatient   Subjective: Pt feeling better. Ambulating hall.    Objective: Vitals:   09/12/16 2117 09/13/16 0439 09/13/16 0847 09/13/16 1446  BP: 125/77 124/70  110/60  Pulse: 85 77  81  Resp: 18 16  16   Temp: 98.2 F (36.8 C) 97.8 F (36.6 C)  98 F (36.7 C)  TempSrc: Oral Oral  Oral  SpO2: 98% 99% 98% 100%    Intake/Output Summary (Last 24 hours) at 09/13/16 1451 Last data filed at 09/13/16 0443  Gross per 24 hour  Intake             1530 ml  Output                0 ml  Net             1530 ml   There were no vitals filed for this visit.  REVIEW OF SYSTEMS  As per history otherwise all reviewed and reported negative  Exam:  General exam: awake, alert, NAD. Cooperative.  Respiratory system:  increased work of breathing. Cardiovascular system: S1 & S2 heard, RRR. No JVD, murmurs, gallops. Gastrointestinal system: Abdomen is obese, soft and no masses. Normal bowel sounds heard. Central nervous system: Alert and oriented. No focal neurological deficits. Extremities: no clubbing.   Data Reviewed: Basic Metabolic Panel:  Recent Labs Lab 09/11/16 2012 09/13/16 0655  NA 137 141  K 2.9* 3.7  CL 102 108  CO2 27 29  GLUCOSE 110* 117*  BUN 12 15  CREATININE 0.89 1.15*  CALCIUM 9.1 8.9  MG  --  2.3   Liver Function Tests:  Recent Labs Lab 09/13/16 0655  AST 24  ALT 21  ALKPHOS 90  BILITOT 0.4  PROT 6.6  ALBUMIN 2.9*   No results for input(s): LIPASE, AMYLASE in the last 168 hours. No results for input(s): AMMONIA in the last 168 hours. CBC:  Recent Labs Lab 09/11/16 2012 09/12/16 0030 09/13/16 0655  WBC 16.5*  --  10.5  NEUTROABS  --  11.6*  --   HGB 12.9  --  12.4  HCT 39.1  --  38.7  MCV 87.5  --  89.4  PLT 307  --  286   Cardiac Enzymes: No results for input(s): CKTOTAL, CKMB, CKMBINDEX, TROPONINI in the last 168 hours. CBG (last 3)  No results for input(s): GLUCAP in the last 72  hours. Recent Results (from the past 240 hour(s))  Urine culture     Status: Abnormal (Preliminary result)   Collection Time: 09/11/16  8:16 PM  Result Value Ref Range Status   Specimen Description URINE, RANDOM  Final   Special Requests NONE  Final   Culture 40,000 COLONIES/mL GRAM NEGATIVE RODS (A)  Final   Report Status PENDING  Incomplete  Wet prep, genital     Status: Abnormal   Collection Time: 09/12/16 12:50 AM  Result Value Ref Range Status   Yeast Wet Prep HPF POC NONE SEEN NONE SEEN Final   Trich, Wet Prep NONE SEEN NONE SEEN Final   Clue Cells Wet Prep HPF POC PRESENT (A) NONE SEEN Final   WBC, Wet Prep HPF POC MANY (A) NONE SEEN Final   Sperm NONE SEEN  Final     Studies: Ct Abdomen Pelvis W Contrast  Result Date: 09/12/2016 CLINICAL DATA:  Initial evaluation for acute abdominal pain, fever, suspected abscess. EXAM: CT ABDOMEN AND PELVIS WITH CONTRAST TECHNIQUE: Multidetector CT imaging of the abdomen and pelvis was performed using the standard protocol following bolus administration of intravenous contrast. CONTRAST:  186mL ISOVUE-300 IOPAMIDOL (ISOVUE-300) INJECTION 61% COMPARISON:  Prior CT from 08/12/2016. FINDINGS: Lower chest: Mild scattered atelectatic changes present within the lung bases. Visualized lungs are otherwise clear. Hepatobiliary: 2 vague hypodense lesions noted within the left and right hepatic lobes, stable from previous, and most likely hemangioma S. liver otherwise unremarkable. Gallbladder within normal limits. No biliary dilatation. Pancreas: Pancreas within normal limits. Spleen: Spleen within normal limits. Adrenals/Urinary Tract: Adrenal glands are normal. Kidneys equal in size with symmetric enhancement. Punctate nonobstructive left renal nephrolithiasis noted. No hydronephrosis. No focal enhancing renal mass. No hydroureter. Bladder largely decompressed without abnormality. Stomach/Bowel: Stomach within normal limits. No evidence for bowel obstruction.  Appendix normal. Sigmoid diverticula again noted. There has been interval removal of a percutaneous drainage catheter. Persistent soft tissue density extends from the sigmoid colon towards the bladder dome, either reflecting phlegmonous change or possibly fibrous tissues. Now evident is a small hypodense collection positioned at the bladder dome measuring approximately 13 x 10 x 12 mm, best appreciated on coronal sequence (series 6, image 70). Finding consistent with recurrent early abscess. No other acute inflammatory changes seen about the bowels. Vascular/Lymphatic: Aorto bi-iliac atherosclerotic disease again noted. Normal intravascular enhancement seen throughout the intra-abdominal aorta and its branch vessels. No adenopathy. Reproductive: Uterus is absent. Ovaries within normal limits. No adnexal mass. Other: No free air or fluid. Small fat containing paraumbilical hernia noted. Musculoskeletal: No acute osseous abnormality. No worrisome lytic or blastic osseous lesions. Multilevel degenerative spondylolysis and facet arthrosis noted within the visualized spine IMPRESSION: 1. Interval removal of percutaneous drainage catheter with development of small approximate 13 mm hypodense collection at the bladder dome, concerning for recurrent/developing abscess. Persistent phlegmonous and/or fibrous soft tissue band extends from the adjacent sigmoid colon, suspected to reflect an underlying fistulous tract. 2. Additional ancillary findings as above, stable from previous. Electronically Signed   By: Jeannine Boga M.D.   On: 09/12/2016 04:01   Scheduled Meds: . atorvastatin  10 mg Oral Daily  . docusate sodium  100 mg Oral BID  . hydrochlorothiazide  12.5 mg Oral Daily  . losartan  100 mg Oral Daily  . mometasone-formoterol  2 puff Inhalation BID  . psyllium  1 packet Oral Daily   Continuous Infusions: . piperacillin-tazobactam (ZOSYN)  IV 3.375 g (09/13/16 1046)   Principal Problem:   Sepsis  ([place]) Active Problems:   Vaginal bleeding   Severe obesity (BMI >= 40) ([facility])   Asthma   Abscess of bladder   Colovesical fistula  Time spent:   Irwin Brakeman, MD, FAAFP [facility] Pager [phone]  If 7PM-7AM, please contact night-coverage [email or web address] Password [email or web address] 09/13/2016, 2:51 PM    LOS: 1 day

## 2016-09-13 NOTE — Progress Notes (Signed)
Patient ID: [name], female   DOB: November 17, 1960, 56 y.o.   MRN: [id number]    Subjective: Pt continues to feel better.  She denies bothersome LUTS or pain this morning.  Labs pending.  Objective: Vital signs in last 24 hours: Temp:  [97.8 F (36.6 C)-98.4 F (36.9 C)] 97.8 F (36.6 C) (08/04 0439) Pulse Rate:  [73-85] 77 (08/04 0439) Resp:  [16-18] 16 (08/04 0439) BP: (114-125)/(70-77) 124/70 (08/04 0439) SpO2:  [97 %-99 %] 99 % (08/04 0439)  Intake/Output from previous day: 08/03 0701 - 08/04 0700 In: 1910 [P.O.:1760; IV Piggyback:150] Out: -  Intake/Output this shift: Total I/O In: 650 [P.O.:600; IV Piggyback:50] Out: -   Physical Exam:  General: Alert and oriented Abd: Soft, NT  Lab Results:  Recent Labs  09/11/16 2012  HGB 12.9  HCT 39.1   CBC Latest Ref Rng & Units 09/11/2016 08/21/2016 08/12/2016  WBC 4.0 - 10.5 K/uL 16.5(H) 10.3 12.8(H)  Hemoglobin 12.0 - 15.0 g/dL 12.9 13.1 14.2  Hematocrit 36.0 - 46.0 % 39.1 39.6 43.2  Platelets 150 - 400 K/uL 307 220.0 341.0     BMET  Recent Labs  09/11/16 2012  NA 137  K 2.9*  CL 102  CO2 27  GLUCOSE 110*  BUN 12  CREATININE 0.89  CALCIUM 9.1     Studies/Results:   Assessment/Plan: Pelvic abscess likely secondary to diverticulitis with question of colovesical fistula:  Patient clinically improving.  Labs pending this morning.  If patient continues to improve and cultures return, can be discharged on culture specific antibiotics and follow up with Dr. Tresa Moore within next couple of weeks for cystoscopy and outpatient evaluation for possible fistula.  If cultures are negative, likely empiric therapy to cover prior culture from June would be most appropriate.   LOS: 1 day   Moiz Ryant,LES 09/13/2016, 7:00 AM

## 2016-09-14 LAB — CBC
HCT: 37.8 % (ref 36.0–46.0)
Hemoglobin: 12.1 g/dL (ref 12.0–15.0)
MCH: 28.8 pg (ref 26.0–34.0)
MCHC: 32 g/dL (ref 30.0–36.0)
MCV: 90 fL (ref 78.0–100.0)
Platelets: 270 10*3/uL (ref 150–400)
RBC: 4.2 MIL/uL (ref 3.87–5.11)
RDW: 15.4 % (ref 11.5–15.5)
WBC: 10.7 10*3/uL — ABNORMAL HIGH (ref 4.0–10.5)

## 2016-09-14 LAB — BASIC METABOLIC PANEL
Anion gap: 6 (ref 5–15)
BUN: 17 mg/dL (ref 6–20)
CO2: 29 mmol/L (ref 22–32)
Calcium: 9.1 mg/dL (ref 8.9–10.3)
Chloride: 103 mmol/L (ref 101–111)
Creatinine, Ser: 0.95 mg/dL (ref 0.44–1.00)
GFR calc Af Amer: >60 mL/min (ref >60)
GFR calc non Af Amer: >60 mL/min (ref >60)
Glucose, Bld: 108 mg/dL — ABNORMAL HIGH (ref 65–99)
Potassium: 3.5 mmol/L (ref 3.5–5.1)
Sodium: 138 mmol/L (ref 135–145)

## 2016-09-14 LAB — URINE CULTURE: Culture: 40000 — AB

## 2016-09-14 MED ORDER — CIPROFLOXACIN HCL 500 MG PO TABS: 500.0000 mg | ORAL_TABLET | Freq: Two times a day (BID) | ORAL | 0 refills | Status: AC

## 2016-09-14 MED ORDER — LOSARTAN POTASSIUM-HCTZ 100-12.5 MG PO TABS: 1.0000 | ORAL_TABLET | Freq: Every day | ORAL | 1 refills | Status: DC

## 2016-09-14 MED ORDER — POTASSIUM CHLORIDE ER 10 MEQ PO TBCR
10.0000 meq | EXTENDED_RELEASE_TABLET | Freq: Every day | ORAL | 0 refills | Status: DC
Start: 2016-09-14 — End: 2016-11-15

## 2016-09-14 NOTE — Discharge Summary (Signed)
Physician Discharge Summary  [name]:[id number] DOB: [birth year] DOA: 09/11/2016  PCP: Biagio Borg, MD  Admit date: 09/11/2016 Discharge date: 09/14/2016  Admitted From: HOME  Disposition: HOME  Recommendations for Outpatient Follow-up:  1. Follow up with Dr. Tresa Moore in 1-2 weeks.  2. Follow up with Leighton Ruff in 2 weeks 3. Follow up with PCP in 2 weeks.  4. Please obtain BMP/CBC in 1-2 weeks  Discharge Condition: STABLE   CODE STATUS: FULL    Brief Hospitalization Summary: Please see all hospital notes, images, labs for full details of the hospitalization. [name] a 56 y.o.femalewith medical history significant of HTN, HLD, asthma, morbid obesity, h/o diverticulitis; who presented with complaints of two-daysof pain with urination. Patient reports significant pain with urination and increased frequency. However, notes that yesterday evening she complained of lower abdominal pain mild nausea and cramps. While urinating she passed gross blood with clots as though she were on her menstrual cycle although she's not had a period in many years. Patient is status post partial hysterectomy for fibroid tumors. She admits to recent sexual intercourse in the last week, but has never had symptoms like this in the past. Denies any fever, weight loss, chest pain, or shortness of breath. Patient states that she just recently had her colonoscopy on 07/25/2016, which showed diverticulosis.  She was admitted into the hospital from 6/18-6/23 for dysuria found to have a bladder abscess for which he underwent placement of a percutaneous drain by interventional radiology.She was treated antibiotics and cultures grew out Proteus and Escherichia coli sensitive to Rocephin. 2 weeks after discharge patient followed up with interventional radiology, had the drain removed,and the area had healed without issue. Patient was not able to follow-up with Jeffie Pollock of urology as advised at her last  discharge.  MDM/Assessment & Plan:   Sepsis secondary to recurrent abscess of the bladder with colovesical fistula: Acute. Patient presents with complaints of dysuria. Initially tachycardic with WBC elevated at 16.5. Lactic acid was reassuring at 1.13.UA positive for signs of infection. Previously diagnosed with Escherichia coli and Proteus during last admission.Patient was placed on broad-spectrum antibiotics of Zosyn. Review records shows the 5 polyps that were removed during her last colonoscopy with Dr. Benson Norway on 07/25/2016 and diverticulitis. - GC chlamydia probe negative - Continue empiric antibiotics of Zosyn, switch to oral antibiotic tomorrow and likely to discharge home - Interventional radiology consult ordered for evaluation for possible need of drain placement  - urology and gen surgery consulted.  Pt will need to follow up with Dr. Tresa Moore in 1-2 weeks after discharge for cystoscopy.  Pt says that she followed up with Dr. Marcello Moores but decided against surgery.   Pt has done well with zosyn IV, WBC down to normal.  Discharge on oral ciprofloxacin given culture results.  E coli infection per urine culture.  Blood cultures NGTD.  Ciprofloxacin 500 BID x 14 d    Hypokalemia: Treated.  Initial potassium 2.9 on admission.Patient given 40 mEq of potassium chloride in the ED. Suspected to be secondary to blood pressure medication. Will discharge on lower dose of HCTZ.   - Discharge on losartan HCT 100/12.5 mg po daily.    Vaginal bleeding with H/O hysterectomy: Patient complained of vaginal bleeding with clots as though she were on her menstrual cycle. Reports recent sexual activity in the last week. However, patient with history of hysterectomy, but still has her ovaries. No significant vaginal abnormalities noted. In the ED vaginal exam was unable  to reveal clear source of bleeding. On the differential is vaginal wall tear from sexual intercourse vs other. Patient unsure of her last Pap smear. - I  called and discussed case with Gyn on call and they reviewed her case and recommended treating her vaginitis which can also cause bleeding.  Says there is nothing different that they would do now.  Follow Hg. Pt says this morning that bleeding has stopped.  Pt says that she will follow up with her Private OBGYN.    Asthma, without acute exacerbation  - Continue pharmacy substitution of Dulera for Advairand albuterol as needed  Essential hypertension - Continue Hyzaar  Hyperlipidemia - Continue Lipitor   Bacterial vaginosis  Morbidly obesity: BMI  DVT prophylaxis:SCDs for now Code Status:full Family Communication:No family present at bedside Disposition Plan:Likely discharged home once medically stable Consults called:none Admission status:Inpatient   Discharge Diagnoses:  Principal Problem:   Sepsis ([place]) Active Problems:   Vaginal bleeding   Severe obesity (BMI >= 40) (HCC)   Asthma   Abscess of bladder   Colovesical fistula  Discharge Instructions: Discharge Instructions    Increase activity slowly    Complete by:  As directed      Allergies as of 09/14/2016      Reactions   Morphine Itching   Shrimp [shellfish Allergy] Itching   Tongue burns   Tramadol Other (See Comments)   confusion   Latex Rash      Medication List    STOP taking these medications   losartan-hydrochlorothiazide 100-25 MG tablet Commonly known as:  HYZAAR Replaced by:  losartan-hydrochlorothiazide 100-12.5 MG tablet     TAKE these medications   albuterol 108 (90 Base) MCG/ACT inhaler Commonly known as:  PROVENTIL HFA;VENTOLIN HFA Inhale 1-2 puffs into the lungs every 6 (six) hours as needed for wheezing.   atorvastatin 10 MG tablet Commonly known as:  LIPITOR Take 1 tablet (10 mg total) by mouth daily.   ciprofloxacin 500 MG tablet Commonly known as:  CIPRO Take 1 tablet (500 mg total) by mouth 2 (two) times daily.   Fluticasone-Salmeterol 250-50 MCG/DOSE  Aepb Commonly known as:  ADVAIR DISKUS Inhale 1 puff into the lungs 2 (two) times daily.   losartan-hydrochlorothiazide 100-12.5 MG tablet Commonly known as:  HYZAAR Take 1 tablet by mouth daily. Replaces:  losartan-hydrochlorothiazide 100-25 MG tablet   potassium chloride 10 MEQ tablet Commonly known as:  K-DUR Take 1 tablet (10 mEq total) by mouth daily.      Follow-up Information    Biagio Borg, MD. Schedule an appointment as soon as possible for a visit in 2 week(s).   Specialties:  Internal Medicine, Radiology Contact information: [place] [address] [phone]        Alexis Frock, MD. Schedule an appointment as soon as possible for a visit in 1 week(s).   Specialty:  Urology Contact information: [place] [address] [phone]        Leighton Ruff, MD. Schedule an appointment as soon as possible for a visit in 2 week(s).   Specialty:  General Surgery Contact information: [address] [place] [address] [phone]          Allergies  Allergen Reactions  . Morphine Itching  . Shrimp [Shellfish Allergy] Itching    Tongue burns  . Tramadol Other (See Comments)    confusion  . Latex Rash   Discharge Medication List as of 09/14/2016 11:24 AM  START taking these medications   Details  ciprofloxacin (CIPRO) 500 MG tablet Take 1 tablet (500 mg total) by mouth 2 (two) times daily., Starting Sun 09/14/2016, Until Sun 09/28/2016, Normal    losartan-hydrochlorothiazide (HYZAAR) 100-12.5 MG tablet Take 1 tablet by mouth daily., Starting Sun 09/14/2016, Normal    potassium chloride (K-DUR) 10 MEQ tablet Take 1 tablet (10 mEq total) by mouth daily., Starting Sun 09/14/2016, Normal      CONTINUE these medications which have NOT CHANGED   Details  albuterol (PROVENTIL HFA;VENTOLIN HFA) 108 (90 Base) MCG/ACT inhaler Inhale 1-2 puffs into the lungs every 6 (six) hours as needed for wheezing., Starting Fri  04/18/2016, Print    atorvastatin (LIPITOR) 10 MG tablet Take 1 tablet (10 mg total) by mouth daily., Starting Fri 12/07/2015, Normal    Fluticasone-Salmeterol (ADVAIR DISKUS) 250-50 MCG/DOSE AEPB Inhale 1 puff into the lungs 2 (two) times daily., Starting Mon 04/07/2016, Print      STOP taking these medications     losartan-hydrochlorothiazide (HYZAAR) 100-25 MG tablet         Procedures/Studies: Ct Abdomen Pelvis W Contrast  Result Date: 09/12/2016 CLINICAL DATA:  Initial evaluation for acute abdominal pain, fever, suspected abscess. EXAM: CT ABDOMEN AND PELVIS WITH CONTRAST TECHNIQUE: Multidetector CT imaging of the abdomen and pelvis was performed using the standard protocol following bolus administration of intravenous contrast. CONTRAST:  133mL ISOVUE-300 IOPAMIDOL (ISOVUE-300) INJECTION 61% COMPARISON:  Prior CT from 08/12/2016. FINDINGS: Lower chest: Mild scattered atelectatic changes present within the lung bases. Visualized lungs are otherwise clear. Hepatobiliary: 2 vague hypodense lesions noted within the left and right hepatic lobes, stable from previous, and most likely hemangioma S. liver otherwise unremarkable. Gallbladder within normal limits. No biliary dilatation. Pancreas: Pancreas within normal limits. Spleen: Spleen within normal limits. Adrenals/Urinary Tract: Adrenal glands are normal. Kidneys equal in size with symmetric enhancement. Punctate nonobstructive left renal nephrolithiasis noted. No hydronephrosis. No focal enhancing renal mass. No hydroureter. Bladder largely decompressed without abnormality. Stomach/Bowel: Stomach within normal limits. No evidence for bowel obstruction. Appendix normal. Sigmoid diverticula again noted. There has been interval removal of a percutaneous drainage catheter. Persistent soft tissue density extends from the sigmoid colon towards the bladder dome, either reflecting phlegmonous change or possibly fibrous tissues. Now evident is a small  hypodense collection positioned at the bladder dome measuring approximately 13 x 10 x 12 mm, best appreciated on coronal sequence (series 6, image 70). Finding consistent with recurrent early abscess. No other acute inflammatory changes seen about the bowels. Vascular/Lymphatic: Aorto bi-iliac atherosclerotic disease again noted. Normal intravascular enhancement seen throughout the intra-abdominal aorta and its branch vessels. No adenopathy. Reproductive: Uterus is absent. Ovaries within normal limits. No adnexal mass. Other: No free air or fluid. Small fat containing paraumbilical hernia noted. Musculoskeletal: No acute osseous abnormality. No worrisome lytic or blastic osseous lesions. Multilevel degenerative spondylolysis and facet arthrosis noted within the visualized spine IMPRESSION: 1. Interval removal of percutaneous drainage catheter with development of small approximate 13 mm hypodense collection at the bladder dome, concerning for recurrent/developing abscess. Persistent phlegmonous and/or fibrous soft tissue band extends from the adjacent sigmoid colon, suspected to reflect an underlying fistulous tract. 2. Additional ancillary findings as above, stable from previous. Electronically Signed   By: Jeannine Boga M.D.   On: 09/12/2016 04:01   Dg Sinus/fist Tube Chk-non Gi  Result Date: 08/21/2016 INDICATION: Status post percutaneous drainage of diverticular abscess along the dome of the bladder on 07/29/2016. Subsequent imaging has demonstrated resolution  of the abscess but evidence of a fistula to the sigmoid colon on drain injection under fluoroscopy on 08/12/2016. She now presents for repeat injection of the drain. Clinically, there has been no output from the drain. EXAM: INJECTION OF PRE-EXISTING PERCUTANEOUS ABSCESS DRAINAGE CATHETER UNDER FLUOROSCOPY MEDICATIONS: None ANESTHESIA/SEDATION: None FLUOROSCOPY TIME:  1.0 minutes.  181 mGy CONTRAST:  20 mL Omnipaque 749 COMPLICATIONS: None  immediate. PROCEDURE: The pre-existing drainage catheter was injected under fluoroscopy with contrast. Fluoroscopic spot images and cine loops were saved. Following injection, injected contrast material was aspirated. The catheter was then cut and removed. A gauze dressing was applied over the catheter exit site. FINDINGS: Injection of the drainage catheter demonstrates filling of a small irregular shaped residual abscess cavity that does not communicate with bowel or the bladder lumen. No further fistula is identified communicating with the sigmoid colon. The drainage catheter was successfully removed. IMPRESSION: Abscess drain injection demonstrates small irregular cavity with no further demonstrated fistula to the sigmoid colon. Given no significant output from the drain, the drain was removed after the injection procedure. Electronically Signed   By: Aletta Edouard M.D.   On: 08/21/2016 13:54      Subjective: Pt feeling much better, ambulating well, having BMs. Urinating well.   Discharge Exam: Vitals:   09/14/16 0452 09/14/16 1049  BP: 125/83 138/64  Pulse: 87 80  Resp: 19 19  Temp: 97.8 F (36.6 C) (!) 97.5 F (36.4 C)   Vitals:   09/13/16 1843 09/13/16 2010 09/14/16 0452 09/14/16 1049  BP: (!) 144/88 138/89 125/83 138/64  Pulse: 88 87 87 80  Resp: 16 18 19 19   Temp: 98.3 F (36.8 C) 97.7 F (36.5 C) 97.8 F (36.6 C) (!) 97.5 F (36.4 C)  TempSrc: Oral Oral Oral Oral  SpO2: 99% 98% 97% 100%   General exam: awake, alert, NAD. Cooperative.  Respiratory system:  increased work of breathing. Cardiovascular system: S1 & S2 heard, RRR. No JVD, murmurs, gallops. Gastrointestinal system: Abdomen is obese, soft and no masses. Normal bowel sounds heard. Central nervous system: Alert and oriented. No focal neurological deficits. Extremities: no clubbing.    The results of significant diagnostics from this hospitalization (including imaging, microbiology, ancillary and laboratory) are  listed below for reference.     Microbiology: Recent Results (from the past 240 hour(s))  Urine culture     Status: Abnormal   Collection Time: 09/11/16  8:16 PM  Result Value Ref Range Status   Specimen Description URINE, RANDOM  Final   Special Requests NONE  Final   Culture 40,000 COLONIES/mL ESCHERICHIA COLI (A)  Final   Report Status 09/14/2016 FINAL  Final   Organism ID, Bacteria ESCHERICHIA COLI (A)  Final      Susceptibility   Escherichia coli - MIC*    AMPICILLIN >=32 RESISTANT Resistant     CEFAZOLIN 16 SENSITIVE Sensitive     CEFTRIAXONE <=1 SENSITIVE Sensitive     CIPROFLOXACIN <=0.25 SENSITIVE Sensitive     GENTAMICIN <=1 SENSITIVE Sensitive     IMIPENEM <=0.25 SENSITIVE Sensitive     NITROFURANTOIN <=16 SENSITIVE Sensitive     TRIMETH/SULFA >=320 RESISTANT Resistant     AMPICILLIN/SULBACTAM >=32 RESISTANT Resistant     PIP/TAZO <=4 SENSITIVE Sensitive     Extended ESBL NEGATIVE Sensitive     * 40,000 COLONIES/mL ESCHERICHIA COLI  Blood culture (routine x 2)     Status: None (Preliminary result)   Collection Time: 09/12/16 12:30 AM  Result Value Ref  Range Status   Specimen Description BLOOD LEFT ANTECUBITAL  Final   Special Requests   Final    BOTTLES DRAWN AEROBIC AND ANAEROBIC Blood Culture adequate volume   Culture NO GROWTH 2 DAYS  Final   Report Status PENDING  Incomplete  Blood culture (routine x 2)     Status: None (Preliminary result)   Collection Time: 09/12/16 12:43 AM  Result Value Ref Range Status   Specimen Description BLOOD RIGHT FOREARM  Final   Special Requests   Final    BOTTLES DRAWN AEROBIC AND ANAEROBIC Blood Culture adequate volume   Culture NO GROWTH 2 DAYS  Final   Report Status PENDING  Incomplete  Wet prep, genital     Status: Abnormal   Collection Time: 09/12/16 12:50 AM  Result Value Ref Range Status   Yeast Wet Prep HPF POC NONE SEEN NONE SEEN Final   Trich, Wet Prep NONE SEEN NONE SEEN Final   Clue Cells Wet Prep HPF POC  PRESENT (A) NONE SEEN Final   WBC, Wet Prep HPF POC MANY (A) NONE SEEN Final   Sperm NONE SEEN  Final     Labs: BNP (last 3 results)  Recent Labs  04/18/16 1201  BNP 36.6   Basic Metabolic Panel:  Recent Labs Lab 09/11/16 2012 09/13/16 0655 09/14/16 0549  NA 137 141 138  K 2.9* 3.7 3.5  CL 102 108 103  CO2 27 29 29   GLUCOSE 110* 117* 108*  BUN 12 15 17   CREATININE 0.89 1.15* 0.95  CALCIUM 9.1 8.9 9.1  MG  --  2.3  --    Liver Function Tests:  Recent Labs Lab 09/13/16 0655  AST 24  ALT 21  ALKPHOS 90  BILITOT 0.4  PROT 6.6  ALBUMIN 2.9*   No results for input(s): LIPASE, AMYLASE in the last 168 hours. No results for input(s): AMMONIA in the last 168 hours. CBC:  Recent Labs Lab 09/11/16 2012 09/12/16 0030 09/13/16 0655 09/14/16 0549  WBC 16.5*  --  10.5 10.7*  NEUTROABS  --  11.6*  --   --   HGB 12.9  --  12.4 12.1  HCT 39.1  --  38.7 37.8  MCV 87.5  --  89.4 90.0  PLT 307  --  286 270   Cardiac Enzymes: No results for input(s): CKTOTAL, CKMB, CKMBINDEX, TROPONINI in the last 168 hours. BNP: Invalid input(s): POCBNP CBG: No results for input(s): GLUCAP in the last 168 hours. D-Dimer No results for input(s): DDIMER in the last 72 hours. Hgb A1c No results for input(s): HGBA1C in the last 72 hours. Lipid Profile No results for input(s): CHOL, HDL, LDLCALC, TRIG, CHOLHDL, LDLDIRECT in the last 72 hours. Thyroid function studies No results for input(s): TSH, T4TOTAL, T3FREE, THYROIDAB in the last 72 hours.  Invalid input(s): FREET3 Anemia work up No results for input(s): VITAMINB12, FOLATE, FERRITIN, TIBC, IRON, RETICCTPCT in the last 72 hours. Urinalysis    Component Value Date/Time   COLORURINE YELLOW 09/11/2016 2016   APPEARANCEUR HAZY (A) 09/11/2016 2016   LABSPEC 1.023 09/11/2016 2016   PHURINE 5.0 09/11/2016 2016   GLUCOSEU NEGATIVE 09/11/2016 2016   GLUCOSEU NEGATIVE 07/25/2016 0905   HGBUR MODERATE (A) 09/11/2016 2016    BILIRUBINUR NEGATIVE 09/11/2016 2016   BILIRUBINUR negative 09/16/2011 [place] 09/11/2016 2016   PROTEINUR 100 (A) 09/11/2016 2016   UROBILINOGEN 0.2 07/25/2016 0905   NITRITE NEGATIVE 09/11/2016 2016   LEUKOCYTESUR LARGE (A) 09/11/2016 2016  Sepsis Labs Invalid input(s): PROCALCITONIN,  WBC,  LACTICIDVEN Microbiology Recent Results (from the past 240 hour(s))  Urine culture     Status: Abnormal   Collection Time: 09/11/16  8:16 PM  Result Value Ref Range Status   Specimen Description URINE, RANDOM  Final   Special Requests NONE  Final   Culture 40,000 COLONIES/mL ESCHERICHIA COLI (A)  Final   Report Status 09/14/2016 FINAL  Final   Organism ID, Bacteria ESCHERICHIA COLI (A)  Final      Susceptibility   Escherichia coli - MIC*    AMPICILLIN >=32 RESISTANT Resistant     CEFAZOLIN 16 SENSITIVE Sensitive     CEFTRIAXONE <=1 SENSITIVE Sensitive     CIPROFLOXACIN <=0.25 SENSITIVE Sensitive     GENTAMICIN <=1 SENSITIVE Sensitive     IMIPENEM <=0.25 SENSITIVE Sensitive     NITROFURANTOIN <=16 SENSITIVE Sensitive     TRIMETH/SULFA >=320 RESISTANT Resistant     AMPICILLIN/SULBACTAM >=32 RESISTANT Resistant     PIP/TAZO <=4 SENSITIVE Sensitive     Extended ESBL NEGATIVE Sensitive     * 40,000 COLONIES/mL ESCHERICHIA COLI  Blood culture (routine x 2)     Status: None (Preliminary result)   Collection Time: 09/12/16 12:30 AM  Result Value Ref Range Status   Specimen Description BLOOD LEFT ANTECUBITAL  Final   Special Requests   Final    BOTTLES DRAWN AEROBIC AND ANAEROBIC Blood Culture adequate volume   Culture NO GROWTH 2 DAYS  Final   Report Status PENDING  Incomplete  Blood culture (routine x 2)     Status: None (Preliminary result)   Collection Time: 09/12/16 12:43 AM  Result Value Ref Range Status   Specimen Description BLOOD RIGHT FOREARM  Final   Special Requests   Final    BOTTLES DRAWN AEROBIC AND ANAEROBIC Blood Culture adequate volume   Culture NO  GROWTH 2 DAYS  Final   Report Status PENDING  Incomplete  Wet prep, genital     Status: Abnormal   Collection Time: 09/12/16 12:50 AM  Result Value Ref Range Status   Yeast Wet Prep HPF POC NONE SEEN NONE SEEN Final   Trich, Wet Prep NONE SEEN NONE SEEN Final   Clue Cells Wet Prep HPF POC PRESENT (A) NONE SEEN Final   WBC, Wet Prep HPF POC MANY (A) NONE SEEN Final   Sperm NONE SEEN  Final   Time coordinating discharge: 34 minutes  SIGNED:  Irwin Brakeman, MD  [facility] 09/14/2016, 1:39 PM Pager [phone]  If 7PM-7AM, please contact night-coverage [email or web address] Password [email or web address]

## 2016-09-14 NOTE — Discharge Instructions (Signed)
°  Follow with Primary MD  Biagio Borg, MD  and other consultant's as instructed your Hospitalist MD  Please get a complete blood count and chemistry panel checked by your Primary MD at your next visit, and again as instructed by your Primary MD.  Get Medicines reviewed and adjusted: Please take all your medications with you for your next visit with your Primary MD  Laboratory/radiological data: Please request your Primary MD to go over all hospital tests and procedure/radiological results at the follow up, please ask your Primary MD to get all Hospital records sent to his/her office.  In some cases, they will be blood work, cultures and biopsy results pending at the time of your discharge. Please request that your primary care M.D. follows up on these results.  Also Note the following: If you experience worsening of your admission symptoms, develop shortness of breath, life threatening emergency, suicidal or homicidal thoughts you must seek medical attention immediately by calling 911 or calling your MD immediately  if symptoms less severe.  You must read complete instructions/literature along with all the possible adverse reactions/side effects for all the Medicines you take and that have been prescribed to you. Take any new Medicines after you have completely understood and accpet all the possible adverse reactions/side effects.   Do not drive when taking Pain medications or sleeping medications (Benzodaizepines)  Do not take more than prescribed Pain, Sleep and Anxiety Medications. It is not advisable to combine anxiety,sleep and pain medications without talking with your primary care practitioner  Special Instructions: If you have smoked or chewed Tobacco  in the last 2 yrs please stop smoking, stop any regular Alcohol  and or any Recreational drug use.  Wear Seat belts while driving.  Please note: You were cared for by a hospitalist during your hospital stay. Once you are discharged,  your primary care physician will handle any further medical issues. Please note that NO REFILLS for any discharge medications will be authorized once you are discharged, as it is imperative that you return to your primary care physician (or establish a relationship with a primary care physician if you do not have one) for your post hospital discharge needs so that they can reassess your need for medications and monitor your lab values.

## 2016-09-17 LAB — CULTURE, BLOOD (ROUTINE X 2)
Culture: NO GROWTH
Culture: NO GROWTH
Special Requests: ADEQUATE
Special Requests: ADEQUATE

## 2016-09-22 DIAGNOSIS — N3020 Other chronic cystitis without hematuria: Secondary | ICD-10-CM | POA: Diagnosis not present

## 2016-09-22 DIAGNOSIS — K632 Fistula of intestine: Secondary | ICD-10-CM | POA: Diagnosis not present

## 2016-09-26 ENCOUNTER — Ambulatory Visit: Payer: Commercial Managed Care - PPO | Admitting: Podiatry

## 2016-10-17 ENCOUNTER — Ambulatory Visit (INDEPENDENT_AMBULATORY_CARE_PROVIDER_SITE_OTHER): Payer: BLUE CROSS/BLUE SHIELD

## 2016-10-17 ENCOUNTER — Encounter: Payer: Self-pay | Admitting: Podiatry

## 2016-10-17 ENCOUNTER — Ambulatory Visit (INDEPENDENT_AMBULATORY_CARE_PROVIDER_SITE_OTHER): Payer: BLUE CROSS/BLUE SHIELD | Admitting: Podiatry

## 2016-10-17 DIAGNOSIS — M25571 Pain in right ankle and joints of right foot: Secondary | ICD-10-CM

## 2016-10-17 DIAGNOSIS — M25572 Pain in left ankle and joints of left foot: Secondary | ICD-10-CM

## 2016-10-17 DIAGNOSIS — B351 Tinea unguium: Secondary | ICD-10-CM

## 2016-10-17 DIAGNOSIS — M79609 Pain in unspecified limb: Secondary | ICD-10-CM

## 2016-10-17 MED ORDER — MELOXICAM 15 MG PO TABS: 15.0000 mg | ORAL_TABLET | Freq: Every day | ORAL | 0 refills | Status: DC

## 2016-10-22 NOTE — Progress Notes (Signed)
   Subjective:    Patient ID: [name], female    DOB: 02-18-1960, 56 y.o.   MRN: [id number]  HPI Chief Complaint  Patient presents with  . debridement    B/L nail trim  . Ankle Pain    B/L LATERAL SIDE; PT STATED, "I HAVE HAD PAIN AND SWELLING FOR THE PAST 3 MONTHS"    56 y.o. female returns for the above complaint. Reports painfully elongated nails. Also complains of pain and swelling of the R ankle for the past 3 months. Pain worsened with activity. Associated swelling. Denies injury. Denies other pedal issues.  Review of Systems     Objective:   Physical Exam There were no vitals filed for this visit. General AA&O x3. Normal mood and affect.  Vascular Dorsalis pedis and posterior tibial pulses  present 2+ bilaterally  Capillary refill normal to all digits. Pedal hair growth diminished.  Neurologic Epicritic sensation present bilaterally.  Dermatologic No open lesions. Interspaces clear of maceration.  Normal skin temperature and turgor. Hyperkeratotic lesions: none bilaterally. Nails: brittle, onychomycosis, thickening  Orthopedic: No history of amputation. MMT 5/5 in dorsiflexion, plantarflexion, inversion, and eversion. Normal lower extremity joint ROM without pain or crepitus. R ankle pain at the STJ with associated edema.      Assessment & Plan:  R Ankle Pain, Sinus Tarsi Syndrome -XR reviewed with patient. -Discussed etiology. -Injection offered but declined.  Onychomycosis with Pain -Nails debrided as below.  Procedure: Nail Debridement Rationale: Pain Type of Debridement: manual, sharp debridement. Instrumentation: Nail nipper, rotary burr. Number of Nails: 10

## 2016-10-24 ENCOUNTER — Ambulatory Visit: Payer: BLUE CROSS/BLUE SHIELD | Admitting: Obstetrics & Gynecology

## 2016-10-29 ENCOUNTER — Ambulatory Visit: Payer: BLUE CROSS/BLUE SHIELD | Admitting: Obstetrics & Gynecology

## 2016-10-31 ENCOUNTER — Telehealth: Payer: Self-pay | Admitting: Internal Medicine

## 2016-10-31 DIAGNOSIS — N3080 Other cystitis without hematuria: Secondary | ICD-10-CM

## 2016-10-31 NOTE — Telephone Encounter (Signed)
Pt made an appointment for Monday October the 8th, she believes an abscess is coming back on her bladder, she would like orders for blood work put in so she can come before her appointment. Please advise

## 2016-10-31 NOTE — Telephone Encounter (Signed)
Ok , orders done 

## 2016-11-14 ENCOUNTER — Other Ambulatory Visit (INDEPENDENT_AMBULATORY_CARE_PROVIDER_SITE_OTHER): Payer: Commercial Managed Care - PPO

## 2016-11-14 ENCOUNTER — Ambulatory Visit: Payer: BLUE CROSS/BLUE SHIELD | Admitting: Podiatry

## 2016-11-14 DIAGNOSIS — N3080 Other cystitis without hematuria: Secondary | ICD-10-CM | POA: Diagnosis not present

## 2016-11-14 LAB — URINALYSIS, ROUTINE W REFLEX MICROSCOPIC
Bilirubin Urine: NEGATIVE
Hgb urine dipstick: NEGATIVE
Ketones, ur: NEGATIVE
Leukocytes, UA: NEGATIVE
Nitrite: NEGATIVE
RBC / HPF: NONE SEEN (ref 0–?)
Specific Gravity, Urine: >=1.03 — AB (ref 1.000–1.030)
Total Protein, Urine: NEGATIVE
Urine Glucose: NEGATIVE
Urobilinogen, UA: 0.2 (ref 0.0–1.0)
pH: 5.5 (ref 5.0–8.0)

## 2016-11-14 LAB — BASIC METABOLIC PANEL
BUN: 17 mg/dL (ref 6–23)
CO2: 29 mEq/L (ref 19–32)
Calcium: 9.7 mg/dL (ref 8.4–10.5)
Chloride: 102 mEq/L (ref 96–112)
Creatinine, Ser: 0.91 mg/dL (ref 0.40–1.20)
GFR: 82.35 mL/min (ref >60.00)
Glucose, Bld: 95 mg/dL (ref 70–99)
Potassium: 3.3 mEq/L — ABNORMAL LOW (ref 3.5–5.1)
Sodium: 139 mEq/L (ref 135–145)

## 2016-11-14 LAB — CBC WITH DIFFERENTIAL/PLATELET
Basophils Absolute: 0.1 10*3/uL (ref 0.0–0.1)
Basophils Relative: 0.8 % (ref 0.0–3.0)
Eosinophils Absolute: 0.2 10*3/uL (ref 0.0–0.7)
Eosinophils Relative: 1.7 % (ref 0.0–5.0)
HCT: 42.3 % (ref 36.0–46.0)
Hemoglobin: 13.6 g/dL (ref 12.0–15.0)
Lymphocytes Relative: 17.6 % (ref 12.0–46.0)
Lymphs Abs: 1.9 10*3/uL (ref 0.7–4.0)
MCHC: 32.2 g/dL (ref 30.0–36.0)
MCV: 89.4 fl (ref 78.0–100.0)
Monocytes Absolute: 0.7 10*3/uL (ref 0.1–1.0)
Monocytes Relative: 6.1 % (ref 3.0–12.0)
Neutro Abs: 8.1 10*3/uL — ABNORMAL HIGH (ref 1.4–7.7)
Neutrophils Relative %: 73.8 % (ref 43.0–77.0)
Platelets: 278 10*3/uL (ref 150.0–400.0)
RBC: 4.73 Mil/uL (ref 3.87–5.11)
RDW: 15.4 % (ref 11.5–15.5)
WBC: 11 10*3/uL — ABNORMAL HIGH (ref 4.0–10.5)

## 2016-11-15 ENCOUNTER — Other Ambulatory Visit: Payer: Self-pay | Admitting: Internal Medicine

## 2016-11-15 LAB — URINE CULTURE
MICRO NUMBER:: 81110427
Result:: NO GROWTH
SPECIMEN QUALITY:: ADEQUATE

## 2016-11-15 MED ORDER — POTASSIUM CHLORIDE ER 10 MEQ PO TBCR
10.0000 meq | EXTENDED_RELEASE_TABLET | Freq: Every day | ORAL | 3 refills | Status: DC
Start: 2016-11-15 — End: 2017-05-01

## 2016-11-17 ENCOUNTER — Ambulatory Visit: Payer: Commercial Managed Care - PPO | Admitting: Internal Medicine

## 2016-11-17 ENCOUNTER — Encounter: Payer: Self-pay | Admitting: Gynecology

## 2016-11-17 ENCOUNTER — Ambulatory Visit (INDEPENDENT_AMBULATORY_CARE_PROVIDER_SITE_OTHER): Payer: BLUE CROSS/BLUE SHIELD | Admitting: Gynecology

## 2016-11-17 VITALS — BP 134/80 | Ht 61.0 in | Wt 284.0 lb

## 2016-11-17 DIAGNOSIS — N952 Postmenopausal atrophic vaginitis: Secondary | ICD-10-CM | POA: Diagnosis not present

## 2016-11-17 DIAGNOSIS — N939 Abnormal uterine and vaginal bleeding, unspecified: Secondary | ICD-10-CM | POA: Diagnosis not present

## 2016-11-17 DIAGNOSIS — Z01411 Encounter for gynecological examination (general) (routine) with abnormal findings: Secondary | ICD-10-CM

## 2016-11-17 LAB — WET PREP FOR TRICH, YEAST, CLUE

## 2016-11-17 MED ORDER — CLINDAMYCIN PHOSPHATE 2 % VA CREA: 1.0000 | TOPICAL_CREAM | Freq: Every day | VAGINAL | 0 refills | Status: DC

## 2016-11-17 MED ORDER — FLUCONAZOLE 150 MG PO TABS: 150.0000 mg | ORAL_TABLET | Freq: Once | ORAL | 0 refills | Status: AC

## 2016-11-17 NOTE — Progress Notes (Signed)
[name] [birth year] [id number]        56 y.o.  G1P1 new patient for annual gynecologic exam.  It has been a number of years since she has had a gynecologic exam. Since August she has noticed some pink staining intermittently with wiping. Seems to occur every several days. No significant discharge or irritation. Status post TAH in the past for leiomyoma. Recent hospital admission for a colovesical fistula with abscess from presumed diverticular abscess. Was treated conservatively with antibiotics and drainage and this has resolved. Noted at the time of the most recent CT scan her ovaries were normal. She notes that the pink staining seems to correlate with this episode of abscess. Also notes though that in the past she's had an episode of some pink staining with vaginitis that they treated with antibiotics and resolved. No history of abnormal Pap smears previously. There is an entry in her past surgical history this is vesicovaginal repair at the time of hysterectomy. The patient denies any knowledge of this.   Past medical history,surgical history, problem list, medications, allergies, family history and social history were all reviewed and documented as reviewed in the EPIC chart.  ROS:  Performed with pertinent positives and negatives included in the history, assessment and plan.   Additional significant findings :  None   Exam:  Gilman Schmidt assistant Vitals:   11/17/16 1148  BP: 134/80  Weight: 284 lb (128.8 kg)  Height: 5\' 1"  (1.549 m)   Body mass index is 53.66 kg/m.  General appearance:  Normal affect, orientation and appearance. Skin: Grossly normal HEENT: Without gross lesions.  No cervical or supraclavicular adenopathy. Thyroid normal.  Lungs:  Clear without wheezing, rales or rhonchi Cardiac: RR, without RMG Abdominal:  Soft, nontender, without masses, guarding, rebound, organomegaly or hernia Breasts:  Examined lying and sitting without masses, retractions, discharge  or axillary adenopathy. Pelvic:  Ext, BUS, Vagina: With atrophic changes. Scant white discharge noted. No evidence of bleeding on vigorous procto-swab sampling of the vaginal canal. Pap smear done of the vaginal vault.  Adnexa: Without gross masses or tenderness    Anus and perineum: Normal   Rectovaginal: Normal sphincter tone without palpated masses or tenderness.    Assessment/Plan:  56 y.o. G1P1 female for annual gynecologic exam.   1. History of vaginal pinkish staining over the last several months.  No pain associated with this. Status post TAH for leiomyoma in the past. Exam with no evidence of bleeding point showing mild atrophic changes. Wet prep is negative.  Differential reviewed with the patient. Suspect low-grade bacterial irritation causing the light staining. Will cover for both yeast with Diflucan 150 mg and for bacterial overgrowth with Cleocin vaginal cream nightly 1 week. Patient will follow up if the staining continues and we'll pursue colposcopy. Possible additional vaginal estrogen supplement also discussed with the patient. She will let me know if she continues to have staining. 2. Epic entry says vesicovaginal repair with hysterectomy. Patient has no recollection of this. Will obtain copy of her operative report from [facility]. Patient knows to call me in several weeks if she does not hear from [place] to remind [place]. 3. Mammography 11/2015. Patient has mammogram planned this coming month. Breast exam normal today. 4. Colonoscopy 2018. 5. Pap smear 2010. Pap smear done today as above. No history of abnormal Pap smears previously. 6. DEXA never. Will plan further into the menopause. 7. Health maintenance. No routine lab work done as patient does this elsewhere.  Follow up if vaginal staining continues. Follow up in one year for reexamination.  Additional time in excess of her breast and pelvic exam was spent in direct face to face counseling and coordination of care in regards to  her vaginal staining.    Anastasio Auerbach MD, 1:01 PM 11/17/2016

## 2016-11-17 NOTE — Patient Instructions (Signed)
Take the one Diflucan pill prescribed.  Use the prescribed a vaginal cream nightly 7 nights.  Follow up if the vaginal spotting continues.  Call the office in 2-3 weeks if you do not hear about the operative report from your hysterectomy.

## 2016-11-19 ENCOUNTER — Other Ambulatory Visit: Payer: Self-pay | Admitting: Internal Medicine

## 2016-11-19 LAB — PAP IG W/ RFLX HPV ASCU

## 2016-11-20 ENCOUNTER — Telehealth: Payer: Self-pay | Admitting: *Deleted

## 2016-11-20 NOTE — Telephone Encounter (Signed)
Pt informed with normal pap smear results in 11/17/16.

## 2016-11-24 ENCOUNTER — Encounter: Payer: Self-pay | Admitting: Interventional Radiology

## 2016-11-26 ENCOUNTER — Telehealth: Payer: Self-pay | Admitting: *Deleted

## 2016-11-26 NOTE — Telephone Encounter (Signed)
(  pt aware you are out of the office) Pt called to follow up with office visit on 11/17/16 states she is still having spotting after completely both Diflucan 150 mg AND Cleocin cream x 1 week.  No change in flow, same as noted in office visit. Pt wanted to know what next? Please advise

## 2016-11-26 NOTE — Telephone Encounter (Signed)
Colposcopy appt

## 2016-11-27 NOTE — Telephone Encounter (Signed)
Left message for pt to call.

## 2016-11-27 NOTE — Telephone Encounter (Signed)
Pt informed with call back to schedule.

## 2016-12-01 ENCOUNTER — Telehealth: Payer: Self-pay | Admitting: *Deleted

## 2016-12-01 NOTE — Telephone Encounter (Signed)
I would move up the colposcopy appointment. I would like to take a look while she is spotting.

## 2016-12-01 NOTE — Telephone Encounter (Signed)
Left a detailed message on pt voicemail to try to move up appointment

## 2016-12-01 NOTE — Telephone Encounter (Signed)
Patient has Colposcopy appointment on 01/09/17, she asked if Rx could be sent to pharmacy to stop spotting? Please advise

## 2016-12-01 NOTE — Telephone Encounter (Signed)
Pt scheduled on 01/09/17

## 2016-12-17 ENCOUNTER — Other Ambulatory Visit: Payer: Self-pay | Admitting: *Deleted

## 2016-12-17 ENCOUNTER — Telehealth: Payer: Self-pay | Admitting: Internal Medicine

## 2016-12-17 MED ORDER — ALBUTEROL SULFATE HFA 108 (90 BASE) MCG/ACT IN AERS: 1.0000 | INHALATION_SPRAY | Freq: Four times a day (QID) | RESPIRATORY_TRACT | 0 refills | Status: DC | PRN

## 2016-12-17 MED ORDER — FLUTICASONE-SALMETEROL 250-50 MCG/DOSE IN AEPB: 1.0000 | INHALATION_SPRAY | Freq: Two times a day (BID) | RESPIRATORY_TRACT | 2 refills | Status: DC

## 2016-12-17 NOTE — Telephone Encounter (Signed)
Yes, this is the same thing, and should be fine as long as covered by her insurance (sometimes one or the other is not covered and we have to change to the other)

## 2016-12-17 NOTE — Telephone Encounter (Signed)
Patient called back in regard to the two medications that Dr. Jenny Reichmann filled today.  Patient states one of the scripts should be [employer].  Would like to know if this is the same as proventil?

## 2016-12-17 NOTE — Telephone Encounter (Signed)
Pt requesting refills on her inhalers. Though she had refills per pharmacy they have expired. Reviewed chart pt is up-to-date [facility].../lmb

## 2016-12-18 NOTE — Telephone Encounter (Signed)
Called pt, LVM.   

## 2017-01-04 ENCOUNTER — Other Ambulatory Visit: Payer: Self-pay | Admitting: Internal Medicine

## 2017-01-06 ENCOUNTER — Other Ambulatory Visit: Payer: Self-pay | Admitting: Internal Medicine

## 2017-01-06 ENCOUNTER — Telehealth: Payer: Self-pay | Admitting: Internal Medicine

## 2017-01-06 DIAGNOSIS — Z1231 Encounter for screening mammogram for malignant neoplasm of breast: Secondary | ICD-10-CM

## 2017-01-06 MED ORDER — ALBUTEROL SULFATE HFA 108 (90 BASE) MCG/ACT IN AERS: 1.0000 | INHALATION_SPRAY | Freq: Four times a day (QID) | RESPIRATORY_TRACT | 5 refills | Status: DC | PRN

## 2017-01-06 NOTE — Telephone Encounter (Signed)
Please advise on refill.

## 2017-01-06 NOTE — Telephone Encounter (Signed)
Copied from [place] [id number]. Topic: Quick Communication - See Telephone Encounter >> Jan 06, 2017  2:40 PM Hewitt Shorts wrote: CRM for notification. See Telephone encounter for: 01/06/17.pt is needing a refill on her albuteral inhaler going out of town and lost original in [place] and only had one refill   Best number [phone]

## 2017-01-06 NOTE — Telephone Encounter (Signed)
Ok, done to [facility]

## 2017-01-06 NOTE — Telephone Encounter (Signed)
Prolonged Rx for inhaler for asthma history. Not addressed recently- review for refill.

## 2017-01-07 NOTE — Telephone Encounter (Signed)
Notified pt rx has been [facility]...[name]

## 2017-01-09 ENCOUNTER — Encounter: Payer: Self-pay | Admitting: Podiatry

## 2017-01-09 ENCOUNTER — Ambulatory Visit: Payer: BLUE CROSS/BLUE SHIELD | Admitting: Gynecology

## 2017-01-09 ENCOUNTER — Ambulatory Visit (INDEPENDENT_AMBULATORY_CARE_PROVIDER_SITE_OTHER): Payer: BLUE CROSS/BLUE SHIELD | Admitting: Podiatry

## 2017-01-09 DIAGNOSIS — L608 Other nail disorders: Secondary | ICD-10-CM | POA: Diagnosis not present

## 2017-01-09 DIAGNOSIS — B351 Tinea unguium: Secondary | ICD-10-CM

## 2017-01-09 DIAGNOSIS — L819 Disorder of pigmentation, unspecified: Secondary | ICD-10-CM | POA: Diagnosis not present

## 2017-01-09 DIAGNOSIS — M79676 Pain in unspecified toe(s): Secondary | ICD-10-CM | POA: Diagnosis not present

## 2017-01-09 DIAGNOSIS — M79609 Pain in unspecified limb: Secondary | ICD-10-CM

## 2017-01-09 DIAGNOSIS — L603 Nail dystrophy: Secondary | ICD-10-CM | POA: Diagnosis not present

## 2017-01-09 NOTE — Progress Notes (Signed)
  Subjective:  Patient ID: [name], female    DOB: [birth year],  MRN: [id number]  Chief Complaint  Patient presents with  . Nail Problem    debride, no other problems   56 y.o. female returns for Painful elongated toenails.  Reports that they tend ingrowing on the big toes  Objective:  There were no vitals filed for this visit. General AA&O x3. Normal mood and affect.  Vascular Dorsalis pedis and posterior tibial pulses  present 2+ bilaterally  Capillary refill normal to all digits. Pedal hair growth normal.  Neurologic Epicritic sensation grossly present.  Dermatologic No open lesions. Interspaces clear of maceration. Nails elongated and thickened with pain to palpation brown-black discoloration and ingrowing at the medial lateral quarters of the hallux nails bilateral  Orthopedic: MMT 5/5 in dorsiflexion, plantarflexion, inversion, and eversion. Normal joint ROM without pain or crepitus.    Assessment & Plan:  Patient was evaluated and treated and all questions answered.  Onychomycosis with pain -Nails palliatively debrided -Nail sample taken for histology and pathology -Follow-up in 5 weeks to review we will plan for therapy at that time  Procedure: Nail Debridement Rationale: Pain Type of Debridement: manual, sharp debridement. Instrumentation: Nail nipper, rotary burr. Number of Nails: 10  Return in about 5 weeks (around 02/13/2017) for nail fungus fu.

## 2017-01-12 NOTE — Addendum Note (Signed)
Addended by: Wardell Heath on: 01/12/2017 04:37 PM   Modules accepted: Orders

## 2017-01-12 NOTE — Addendum Note (Signed)
Addended by: Celene Skeen A on: 01/12/2017 04:18 PM   Modules accepted: Orders

## 2017-01-15 ENCOUNTER — Other Ambulatory Visit: Payer: Self-pay | Admitting: Internal Medicine

## 2017-01-22 ENCOUNTER — Ambulatory Visit (HOSPITAL_COMMUNITY)
Admission: EM | Admit: 2017-01-22 | Discharge: 2017-01-22 | Disposition: A | Discharge status: Home / Self Care | Payer: BLUE CROSS/BLUE SHIELD | Attending: Emergency Medicine | Admitting: Emergency Medicine

## 2017-01-22 ENCOUNTER — Encounter (HOSPITAL_COMMUNITY): Payer: Self-pay | Admitting: Family Medicine

## 2017-01-22 DIAGNOSIS — J019 Acute sinusitis, unspecified: Secondary | ICD-10-CM | POA: Diagnosis not present

## 2017-01-22 MED ORDER — AMOXICILLIN-POT CLAVULANATE 500-125 MG PO TABS: 1.0000 | ORAL_TABLET | Freq: Three times a day (TID) | ORAL | 0 refills | Status: AC

## 2017-01-22 MED ORDER — DM-GUAIFENESIN ER 30-600 MG PO TB12: 1.0000 | ORAL_TABLET | Freq: Two times a day (BID) | ORAL | 0 refills | Status: DC

## 2017-01-22 MED ORDER — CETIRIZINE HCL 10 MG PO TABS: 10.0000 mg | ORAL_TABLET | Freq: Every day | ORAL | 0 refills | Status: DC

## 2017-01-22 NOTE — ED Triage Notes (Signed)
Pt here for nasal dryness and headaches x 2 weeks. reports that she has been using OTC meds without relief. Sinex. Denies any purulent drainage from nose, fever. Also ear fullness and dizziness.

## 2017-01-22 NOTE — ED Provider Notes (Signed)
[place]    CSN: [id number] Arrival date & time: 01/22/17  1700     History   Chief Complaint Chief Complaint  Patient presents with  . Nasal Congestion    HPI [name] is a 56 y.o. female with URI symptoms on and off for the past 2 weeks. States she has recently developed a cough and hoarseness. Has frontal headache and "ear fluttering". No rhinnorhea but does feel congested and like she has drainage in her throat. Endorses dizziness. History of asthma on albuterol and Advair no shortness of breath and chest discomfort. Endorses eyes burning. Taken sinex OTC.  HPI  Past Medical History:  Diagnosis Date  . Alcohol abuse    abuse- moderate years ago - states only drinks now on weekends -2 to 8 driinks   . Asthma   . COLONIC POLYPS, HX OF 04/05/2010  . DIVERTICULITIS, HX OF 04/05/2010  . DJD (degenerative joint disease)    right knee, mot to severe  . GERD (gastroesophageal reflux disease)    no meds  . Heart murmur    hx of   . Hyperlipidemia   . Hypertension   . Impaired glucose tolerance 12/06/2013  . Obesity (BMI 30-39.9)   . PALPITATIONS, HX OF 09/14/2007    Patient Active Problem List   Diagnosis Date Noted  . Sepsis ([place]) 09/12/2016  . Colovesical fistula 09/12/2016  . Hypertension 07/28/2016  . Alcohol abuse 07/28/2016  . Obesity (BMI 30-39.9) 07/28/2016  . Abscess of bladder 07/28/2016  . Colonic diverticulum 07/28/2016  . Acute hypokalemia 07/28/2016  . HLD (hyperlipidemia) 07/28/2016  . Hyperlipidemia 07/10/2016  . Mass of left side of neck 07/10/2016  . Head and neck lymphadenopathy 07/10/2016  . Acute sinus infection 01/25/2016  . Eustachian tube disorder 01/25/2016  . Asthma exacerbation 05/18/2015  . Peripheral edema 03/16/2015  . Asthma 03/16/2015  . Right shoulder pain 03/16/2015  . Hypokalemia 10/04/2014  . Upper airway cough syndrome 10/03/2014  . Severe obesity (BMI >= 40) ([place]) 10/03/2014  . Lower back pain  05/25/2014  . Bilateral shoulder pain 05/25/2014  . Chest pain 04/14/2014  . Allergic rhinitis 12/06/2013  . Impaired glucose tolerance 12/06/2013  . Expected blood loss anemia 12/16/2012  . Morbid obesity ([place]) 12/15/2012  . S/P left TKA 12/14/2012  . Goiter 11/26/2012  . Preop exam for internal medicine 11/26/2012  . Diarrhea 02/25/2012  . Vaginal bleeding 04/30/2011  . Colon polyps 02/10/2011  . Eczema 02/10/2011  . Depression 02/10/2011  . Preventative health care 09/27/2010  . Anxiety state 04/05/2010  . DIVERTICULITIS, HX OF 04/05/2010  . PALPITATIONS, HX OF 09/14/2007  . Obesity 04/24/2007  . Essential hypertension 04/24/2007  . VOCAL CORD DISORDER 04/24/2007  . Extrinsic asthma 04/24/2007  . GERD 04/24/2007    Past Surgical History:  Procedure Laterality Date  . ABDOMINAL HYSTERECTOMY  age 20   fibroids  . COLONOSCOPY WITH PROPOFOL N/A 07/25/2016   Procedure: COLONOSCOPY WITH PROPOFOL;  Surgeon: Carol Ada, MD;  Location: [facility];  Service: Endoscopy;  Laterality: N/A;  . colonscopy     x 2  . IR RADIOLOGIST EVAL & MGMT  08/12/2016  . IR RADIOLOGIST EVAL & MGMT  08/21/2016  . KNEE ARTHROSCOPY     left   . TOTAL KNEE ARTHROPLASTY  07/29/2011   Procedure: TOTAL KNEE ARTHROPLASTY;  Surgeon: Mauri Pole, MD;  Location: [facility] ORS;  Service: Orthopedics;  Laterality: Right;  . TOTAL KNEE ARTHROPLASTY Left 12/14/2012  Procedure: LEFT TOTAL KNEE ARTHROPLASTY;  Surgeon: Mauri Pole, MD;  Location: [facility] ORS;  Service: Orthopedics;  Laterality: Left;  Marland Kitchen VESICOVAGINAL FISTULA CLOSURE W/ TAH      OB History    Gravida Para Term Preterm AB Living   1 1       1    SAB TAB Ectopic Multiple Live Births                   Home Medications    Prior to Admission medications   Medication Sig Start Date End Date Taking? Authorizing Provider  albuterol (PROVENTIL HFA;VENTOLIN HFA) 108 (90 Base) MCG/ACT inhaler Inhale 1-2 puffs into the lungs every 6 (six) hours as needed  for wheezing. 01/06/17   Biagio Borg, MD  amoxicillin-clavulanate (AUGMENTIN) 500-125 MG tablet Take 1 tablet (500 mg total) by mouth every 8 (eight) hours for 7 days. 01/22/17 01/29/17  Sherriann Szuch C, PA-C  atorvastatin (LIPITOR) 10 MG tablet TAKE 1 TABLET BY MOUTH EVERY DAY 01/05/17   Biagio Borg, MD  cetirizine (ZYRTEC ALLERGY) 10 MG tablet Take 1 tablet (10 mg total) by mouth daily for 14 days. 01/22/17 02/05/17  Kolbi Tofte C, PA-C  clindamycin (CLEOCIN) 2 % vaginal cream Place 1 Applicatorful vaginally at bedtime. 11/17/16   Fontaine, Belinda Block, MD  dextromethorphan-guaiFENesin (MUCINEX DM) 30-600 MG 12hr tablet Take 1 tablet by mouth 2 (two) times daily. 01/22/17   Ketina Mars C, PA-C  Fluticasone-Salmeterol (ADVAIR DISKUS) 250-50 MCG/DOSE AEPB Inhale 1 puff 2 (two) times daily into the lungs. 12/17/16   Biagio Borg, MD  losartan-hydrochlorothiazide [facility]) 100-12.5 MG tablet TAKE 1 TABLET BY MOUTH EVERY DAY 01/15/17   Biagio Borg, MD  meloxicam (MOBIC) 15 MG tablet Take 1 tablet (15 mg total) by mouth daily. 10/17/16   Evelina Bucy, DPM  potassium chloride (K-DUR) 10 MEQ tablet Take 1 tablet (10 mEq total) by mouth daily. 11/15/16   Biagio Borg, MD    Family History Family History  Problem Relation Age of Onset  . Stroke Mother   . COPD Father   . Lymphoma Sister     Social History Social History   Tobacco Use  . Smoking status: Former Smoker    Packs/day: 0.50    Years: 7.00    Pack years: 3.50    Types: Cigarettes    Last attempt to quit: 02/11/1988    Years since quitting: 28.9  . Smokeless tobacco: Never Used  Substance Use Topics  . Alcohol use: Yes    Comment: Occas  . Drug use: No    Comment: smoked marajuania x 10 years.  Quit in 1985.     Allergies   Morphine; Shrimp [shellfish allergy]; Tramadol; and Latex   Review of Systems Review of Systems  Constitutional: Negative for appetite change, chills and fever.  HENT: Positive for  congestion, postnasal drip, sinus pressure and voice change. Negative for ear pain, rhinorrhea and sore throat.   Eyes: Positive for itching. Negative for pain and visual disturbance.  Respiratory: Positive for cough. Negative for shortness of breath.   Cardiovascular: Negative for chest pain and palpitations.  Gastrointestinal: Negative for abdominal pain, diarrhea, nausea and vomiting.  Musculoskeletal: Negative for arthralgias, back pain and myalgias.  Skin: Negative for color change and rash.  Neurological: Positive for dizziness, light-headedness and headaches. Negative for syncope.  All other systems reviewed and are negative.    Physical Exam Triage Vital Signs ED Triage Vitals  Enc Vitals Group     BP 01/22/17 1720 (!) 157/91     Pulse Rate 01/22/17 1720 93     Resp 01/22/17 1720 18     Temp 01/22/17 1720 98.2 F (36.8 C)     Temp Source 01/22/17 1720 Oral     SpO2 01/22/17 1720 98 %     Weight --      Height --      Head Circumference --      Peak Flow --      Pain Score 01/22/17 1719 8     Pain Loc --      Pain Edu? --      Excl. in [place]? --    No data found.  Updated Vital Signs BP (!) 157/91 (BP Location: Left Arm)   Pulse 93   Temp 98.2 F (36.8 C) (Oral)   Resp 18   SpO2 98%   Visual Acuity Right Eye Distance:   Left Eye Distance:   Bilateral Distance:    Right Eye Near:   Left Eye Near:    Bilateral Near:     Physical Exam  Constitutional: She is oriented to person, place, and time. She appears well-developed and well-nourished. No distress.  HENT:  Head: Normocephalic and atraumatic.  Right Ear: Ear canal normal. Tympanic membrane is not erythematous. A middle ear effusion is present.  Left Ear: Tympanic membrane and ear canal normal. Tympanic membrane is not erythematous.  Nose: Nose normal. No rhinorrhea.  Mouth/Throat: Uvula is midline, oropharynx is clear and moist and mucous membranes are normal. No trismus in the jaw. No uvula swelling.  Tonsils are 0 on the right. Tonsils are 0 on the left. No tonsillar exudate.  Bubbles present behind right TM  Nasal turbinate erythema  Eyes: Conjunctivae are normal.  Neck: Neck supple.  Cardiovascular: Normal rate and regular rhythm.  No murmur heard. Pulmonary/Chest: Effort normal and breath sounds normal. No respiratory distress. She has no wheezes. She has no rales.  Abdominal: Soft. She exhibits no distension. There is no tenderness. There is no guarding.  Musculoskeletal: She exhibits no edema.  Neurological: She is alert and oriented to person, place, and time.  Skin: Skin is warm and dry.  Psychiatric: She has a normal mood and affect.  Nursing note and vitals reviewed.    UC Treatments / Results  Labs (all labs ordered are listed, but only abnormal results are displayed) Labs Reviewed - No data to display  EKG  EKG Interpretation None       Radiology No results found.  Procedures Procedures (including critical care time)  Medications Ordered in UC Medications - No data to display   Initial Impression / Assessment and Plan / UC Course  I have reviewed the triage vital signs and the nursing notes.  Pertinent labs & imaging results that were available during my care of the patient were reviewed by me and considered in my medical decision making (see chart for details).     Patient presents with symptoms likely from a viral upper respiratory infection, given duration of symptoms will treat for bacterial sinusitis. Differential includes bacterial pneumonia, sinusitis, allergic rhinitis, acute bronchitis. Do not suspect underlying cardiopulmonary process. Symptoms seem unlikely related to ACS, CHF or COPD exacerbations, pneumonia, pneumothorax. Patient is nontoxic appearing and not in need of emergent medical intervention.  Augmentin Prescribed. Recommended symptom control with over the counter medications: Daily oral anti-histamine, Oral decongestant or IN  corticosteroid, saline irrigations, cepacol lozenges,  Robitussin, Delsym, honey tea.  Return if symptoms fail to improve in 1-2 weeks or you develop shortness of breath, chest pain, severe headache. Patient states understanding and is agreeable.  Discharged with PCP followup.    Final Clinical Impressions(s) / UC Diagnoses   Final diagnoses:  Acute sinusitis with symptoms > 10 days    ED Discharge Orders        Ordered    cetirizine (ZYRTEC ALLERGY) 10 MG tablet  Daily     01/22/17 1750    amoxicillin-clavulanate (AUGMENTIN) 500-125 MG tablet  Every 8 hours     01/22/17 1750    dextromethorphan-guaiFENesin (MUCINEX DM) 30-600 MG 12hr tablet  2 times daily     01/22/17 1750       Controlled Substance Prescriptions [place] Controlled Substance Registry consulted? Not Applicable   Janith Lima, [place] 01/22/17 2055

## 2017-01-22 NOTE — Discharge Instructions (Signed)
I am sending in an antibiotic for sinus infection- Augmentin. We recommended symptom control also. I expect your symptoms to start improving in the next week.   1. Take a daily allergy pill/anti-histamine like Zyrtec, Claritin, or Store brand consistently for 2 weeks  2. For congestion you may try an oral decongestant like Mucinex or sudafed. You may also try intranasal flonase nasal spray or saline irrigations (neti pot, sinus cleanse)  3. For your sore throat you may try cepacol lozenges, salt water gargles, throat spray. Treatment of congestion may also help your sore throat.  4. For cough you may try Robitussen, Mucinex DM  5. Take Tylenol or Ibuprofen to help with pain/inflammation  6. Stay hydrated, drink plenty of fluids to keep throat coated and less irritated  For sore throat try using a honey-based tea. Use 3 teaspoons of honey with juice squeezed from half lemon. Place shaved pieces of ginger into 1/2-1 cup of water and warm over stove top. Then mix the ingredients and repeat every 4 hours as needed.

## 2017-01-27 ENCOUNTER — Telehealth: Payer: Self-pay

## 2017-01-27 ENCOUNTER — Other Ambulatory Visit: Payer: Self-pay | Admitting: Internal Medicine

## 2017-01-27 DIAGNOSIS — Z0000 Encounter for general adult medical examination without abnormal findings: Secondary | ICD-10-CM

## 2017-01-27 DIAGNOSIS — R739 Hyperglycemia, unspecified: Secondary | ICD-10-CM

## 2017-01-27 NOTE — Telephone Encounter (Signed)
Please advise. Patient is not coming for a CP fridayE.    Copied from [place]. Topic: General - Other >> Jan 27, 2017 11:25 AM Carolyn Stare wrote:   Pt said she would like to have some labs done before her appt on Friday 01/30/17 would like a call back   [phone]

## 2017-01-27 NOTE — Addendum Note (Signed)
Addended by: Biagio Borg on: 01/27/2017 12:50 PM   Modules accepted: Orders

## 2017-01-27 NOTE — Telephone Encounter (Signed)
Labs ordered.

## 2017-01-30 ENCOUNTER — Other Ambulatory Visit (INDEPENDENT_AMBULATORY_CARE_PROVIDER_SITE_OTHER): Payer: BLUE CROSS/BLUE SHIELD

## 2017-01-30 ENCOUNTER — Encounter: Payer: Self-pay | Admitting: Gynecology

## 2017-01-30 ENCOUNTER — Encounter: Payer: Self-pay | Admitting: Internal Medicine

## 2017-01-30 ENCOUNTER — Telehealth: Payer: Self-pay | Admitting: *Deleted

## 2017-01-30 ENCOUNTER — Ambulatory Visit (INDEPENDENT_AMBULATORY_CARE_PROVIDER_SITE_OTHER): Payer: BLUE CROSS/BLUE SHIELD | Admitting: Internal Medicine

## 2017-01-30 ENCOUNTER — Ambulatory Visit: Payer: BLUE CROSS/BLUE SHIELD | Admitting: Gynecology

## 2017-01-30 VITALS — BP 124/86 | HR 111 | Temp 98.6°F | Ht 61.0 in | Wt 286.0 lb

## 2017-01-30 DIAGNOSIS — Z0000 Encounter for general adult medical examination without abnormal findings: Secondary | ICD-10-CM | POA: Diagnosis not present

## 2017-01-30 DIAGNOSIS — R739 Hyperglycemia, unspecified: Secondary | ICD-10-CM | POA: Diagnosis not present

## 2017-01-30 DIAGNOSIS — R7302 Impaired glucose tolerance (oral): Secondary | ICD-10-CM

## 2017-01-30 DIAGNOSIS — I10 Essential (primary) hypertension: Secondary | ICD-10-CM | POA: Diagnosis not present

## 2017-01-30 LAB — HEPATIC FUNCTION PANEL
ALT: 20 U/L (ref 0–35)
AST: 21 U/L (ref 0–37)
Albumin: 4 g/dL (ref 3.5–5.2)
Alkaline Phosphatase: 94 U/L (ref 39–117)
Bilirubin, Direct: 0.1 mg/dL (ref 0.0–0.3)
Total Bilirubin: 0.4 mg/dL (ref 0.2–1.2)
Total Protein: 7.6 g/dL (ref 6.0–8.3)

## 2017-01-30 LAB — BASIC METABOLIC PANEL
BUN: 21 mg/dL (ref 6–23)
CO2: 27 mEq/L (ref 19–32)
Calcium: 9.4 mg/dL (ref 8.4–10.5)
Chloride: 104 mEq/L (ref 96–112)
Creatinine, Ser: 0.94 mg/dL (ref 0.40–1.20)
GFR: 79.26 mL/min (ref >60.00)
Glucose, Bld: 93 mg/dL (ref 70–99)
Potassium: 3.9 mEq/L (ref 3.5–5.1)
Sodium: 139 mEq/L (ref 135–145)

## 2017-01-30 LAB — LIPID PANEL
Cholesterol: 173 mg/dL (ref 0–200)
HDL: 66.1 mg/dL (ref >39.00)
LDL Cholesterol: 94 mg/dL (ref 0–99)
NonHDL: 106.91
Total CHOL/HDL Ratio: 3
Triglycerides: 67 mg/dL (ref 0.0–149.0)
VLDL: 13.4 mg/dL (ref 0.0–40.0)

## 2017-01-30 LAB — CBC WITH DIFFERENTIAL/PLATELET
Basophils Absolute: 0.1 10*3/uL (ref 0.0–0.1)
Basophils Relative: 1 % (ref 0.0–3.0)
Eosinophils Absolute: 0.2 10*3/uL (ref 0.0–0.7)
Eosinophils Relative: 1.4 % (ref 0.0–5.0)
HCT: 44.7 % (ref 36.0–46.0)
Hemoglobin: 14.6 g/dL (ref 12.0–15.0)
Lymphocytes Relative: 20.8 % (ref 12.0–46.0)
Lymphs Abs: 2.2 10*3/uL (ref 0.7–4.0)
MCHC: 32.7 g/dL (ref 30.0–36.0)
MCV: 90.5 fl (ref 78.0–100.0)
Monocytes Absolute: 0.8 10*3/uL (ref 0.1–1.0)
Monocytes Relative: 7.4 % (ref 3.0–12.0)
Neutro Abs: 7.3 10*3/uL (ref 1.4–7.7)
Neutrophils Relative %: 69.4 % (ref 43.0–77.0)
Platelets: 223 10*3/uL (ref 150.0–400.0)
RBC: 4.93 Mil/uL (ref 3.87–5.11)
RDW: 15.2 % (ref 11.5–15.5)
WBC: 10.5 10*3/uL (ref 4.0–10.5)

## 2017-01-30 LAB — TSH: TSH: 1.16 u[IU]/mL (ref 0.35–4.50)

## 2017-01-30 LAB — URINALYSIS, ROUTINE W REFLEX MICROSCOPIC
Bilirubin Urine: NEGATIVE
Hgb urine dipstick: NEGATIVE
Ketones, ur: NEGATIVE
Leukocytes, UA: NEGATIVE
Nitrite: NEGATIVE
Specific Gravity, Urine: >=1.03 — AB (ref 1.000–1.030)
Total Protein, Urine: NEGATIVE
Urine Glucose: NEGATIVE
Urobilinogen, UA: 0.2 (ref 0.0–1.0)
pH: 5.5 (ref 5.0–8.0)

## 2017-01-30 LAB — HEMOGLOBIN A1C: Hgb A1c MFr Bld: 5.9 % (ref 4.6–6.5)

## 2017-01-30 MED ORDER — LOSARTAN POTASSIUM-HCTZ 100-25 MG PO TABS: 1.0000 | ORAL_TABLET | Freq: Every day | ORAL | 3 refills | Status: DC

## 2017-01-30 NOTE — Assessment & Plan Note (Signed)

## 2017-01-30 NOTE — Assessment & Plan Note (Signed)
stable overall by history and exam, recent data reviewed with pt, and pt to continue medical treatment as before,  to f/u any worsening symptoms or concerns Lab Results  Component Value Date   HGBA1C 5.9 01/30/2017

## 2017-01-30 NOTE — Telephone Encounter (Signed)
Left message for pt to call.

## 2017-01-30 NOTE — Telephone Encounter (Signed)
The colposcopy was ordered to rule out any lesions that could be the source of the bleeding.  Because she is not actively bleeding at this point does not mean that she does not have a lesion and for completeness I would recommend having the colposcopy to make sure there is nothing that we are missing.

## 2017-01-30 NOTE — Telephone Encounter (Signed)
Patient schedule for colposcopy at 10:00am states no bleeding for over 1 month, this is why colposcopy was schedule due constant spotting. appointment has been canceled for today. Pt said will follow up if bleeding occurs again.

## 2017-01-30 NOTE — Progress Notes (Signed)
Subjective:    Patient ID: [name], female    DOB: 12-Apr-1960, 56 y.o.   MRN: [id number]  HPI  Here for wellness and f/u;  Overall doing ok;  Pt denies Chest pain, worsening SOB, DOE, wheezing, orthopnea, PND, worsening LE edema, palpitations, dizziness or syncope.  Pt denies neurological change such as new headache, facial or extremity weakness.  Pt denies polydipsia, polyuria, or low sugar symptoms. Pt states overall good compliance with treatment and medications, good tolerability, and has been trying to follow appropriate diet.  Pt denies worsening depressive symptoms, suicidal ideation or panic. No fever, night sweats, wt loss, loss of appetite, or other constitutional symptoms.  Pt states good ability with ADL's, has low fall risk, home safety reviewed and adequate, no other significant changes in hearing or vision, and only occasionally active with exercise.  Peak wt down from 302.   Wt Readings from Last 3 Encounters:  01/30/17 286 lb (129.7 kg)  Nov 29, 2016 284 lb (128.8 kg)  08/21/16 283 lb (128.4 kg)  Bp has been up recently to 170/120, BP Readings from Last 3 Encounters:  01/30/17 124/86  01/22/17 (!) 157/91  11/29/16 134/80  Brother died 2 wks ago  - Heart dz and lung cancer.   Declines immunizations for now. No other interval hx or new complaints Past Medical History:  Diagnosis Date  . Alcohol abuse    abuse- moderate years ago - states only drinks now on weekends -2 to 8 driinks   . Asthma   . COLONIC POLYPS, HX OF 04/05/2010  . DIVERTICULITIS, HX OF 04/05/2010  . DJD (degenerative joint disease)    right knee, mot to severe  . GERD (gastroesophageal reflux disease)    no meds  . Heart murmur    hx of   . Hyperlipidemia   . Hypertension   . Impaired glucose tolerance 12/06/2013  . Obesity (BMI 30-39.9)   . PALPITATIONS, HX OF 09/14/2007   Past Surgical History:  Procedure Laterality Date  . ABDOMINAL HYSTERECTOMY  age 38   fibroids  . COLONOSCOPY WITH PROPOFOL  N/A 07/25/2016   Procedure: COLONOSCOPY WITH PROPOFOL;  Surgeon: Carol Ada, MD;  Location: [facility];  Service: Endoscopy;  Laterality: N/A;  . colonscopy     x 2  . IR RADIOLOGIST EVAL & MGMT  08/12/2016  . IR RADIOLOGIST EVAL & MGMT  08/21/2016  . KNEE ARTHROSCOPY     left   . TOTAL KNEE ARTHROPLASTY  07/29/2011   Procedure: TOTAL KNEE ARTHROPLASTY;  Surgeon: Mauri Pole, MD;  Location: [facility];  Service: Orthopedics;  Laterality: Right;  . TOTAL KNEE ARTHROPLASTY Left 12/14/2012   Procedure: LEFT TOTAL KNEE ARTHROPLASTY;  Surgeon: Mauri Pole, MD;  Location: [facility];  Service: Orthopedics;  Laterality: Left;  Marland Kitchen VESICOVAGINAL FISTULA CLOSURE W/ TAH      reports that she quit smoking about 28 years ago. Her smoking use included cigarettes. She has a 3.50 pack-year smoking history. she has never used smokeless tobacco. She reports that she drinks alcohol. She reports that she does not use drugs. family history includes COPD in her father; Lymphoma in her sister; Stroke in her mother. Allergies  Allergen Reactions  . Morphine Itching  . Shrimp [Shellfish Allergy] Itching    Tongue burns  . Tramadol Other (See Comments)    confusion  . Latex Rash   Current Outpatient Medications on File Prior to Visit  Medication Sig Dispense Refill  . albuterol (PROVENTIL HFA;VENTOLIN  HFA) 108 (90 Base) MCG/ACT inhaler Inhale 1-2 puffs into the lungs every 6 (six) hours as needed for wheezing. 1 Inhaler 5  . atorvastatin (LIPITOR) 10 MG tablet TAKE 1 TABLET BY MOUTH EVERY DAY 90 tablet 1  . cetirizine (ZYRTEC ALLERGY) 10 MG tablet Take 1 tablet (10 mg total) by mouth daily for 14 days. 14 tablet 0  . clindamycin (CLEOCIN) 2 % vaginal cream Place 1 Applicatorful vaginally at bedtime. 40 g 0  . dextromethorphan-guaiFENesin (MUCINEX DM) 30-600 MG 12hr tablet Take 1 tablet by mouth 2 (two) times daily. 10 tablet 0  . Fluticasone-Salmeterol (ADVAIR DISKUS) 250-50 MCG/DOSE AEPB Inhale 1 puff 2 (two)  times daily into the lungs. 3 each 2  . meloxicam (MOBIC) 15 MG tablet Take 1 tablet (15 mg total) by mouth daily. 30 tablet 0  . potassium chloride (K-DUR) 10 MEQ tablet Take 1 tablet (10 mEq total) by mouth daily. 90 tablet 3   No current facility-administered medications on file prior to visit.    Review of Systems Constitutional: Negative for other unusual diaphoresis, sweats, appetite or weight changes HENT: Negative for other worsening hearing loss, ear pain, facial swelling, mouth sores or neck stiffness.   Eyes: Negative for other worsening pain, redness or other visual disturbance.  Respiratory: Negative for other stridor or swelling Cardiovascular: Negative for other palpitations or other chest pain  Gastrointestinal: Negative for worsening diarrhea or loose stools, blood in stool, distention or other pain Genitourinary: Negative for hematuria, flank pain or other change in urine volume.  Musculoskeletal: Negative for myalgias or other joint swelling.  Skin: Negative for other color change, or other wound or worsening drainage.  Neurological: Negative for other syncope or numbness. Hematological: Negative for other adenopathy or swelling Psychiatric/Behavioral: Negative for hallucinations, other worsening agitation, SI, self-injury, or new decreased concentration All other system neg per pt    Objective:   Physical Exam BP 124/86   Pulse (!) 111   Temp 98.6 F (37 C) (Oral)   Ht 5\' 1"  (1.549 m)   Wt 286 lb (129.7 kg)   SpO2 98%   BMI 54.04 kg/m  VS noted,  Constitutional: Pt is oriented to person, place, and time. Appears well-developed and well-nourished, in no significant distress and comfortable Head: Normocephalic and atraumatic  Eyes: Conjunctivae and EOM are normal. Pupils are equal, round, and reactive to light Right Ear: External ear normal without discharge Left Ear: External ear normal without discharge Nose: Nose without discharge or deformity Mouth/Throat:  Oropharynx is without other ulcerations and moist  Neck: Normal range of motion. Neck supple. No JVD present. No tracheal deviation present or significant neck LA or mass Cardiovascular: Normal rate, regular rhythm, normal heart sounds and intact distal pulses.   Pulmonary/Chest: WOB normal and breath sounds without rales or wheezing  Abdominal: Soft. Bowel sounds are normal. NT. No HSM  Musculoskeletal: Normal range of motion. Exhibits no edema Lymphadenopathy: Has no other cervical adenopathy.  Neurological: Pt is alert and oriented to person, place, and time. Pt has normal reflexes. No cranial nerve deficit. Motor grossly intact, Gait intact Skin: Skin is warm and dry. No rash noted or new ulcerations Psychiatric:  Has normal mood and affect. Behavior is normal without agitation No other exam findings Lab Results  Component Value Date   WBC 10.5 01/30/2017   HGB 14.6 01/30/2017   HCT 44.7 01/30/2017   PLT 223.0 01/30/2017   GLUCOSE 93 01/30/2017   CHOL 173 01/30/2017   TRIG 67.0  01/30/2017   HDL 66.10 01/30/2017   LDLDIRECT 107.0 02/23/2012   LDLCALC 94 01/30/2017   ALT 20 01/30/2017   AST 21 01/30/2017   NA 139 01/30/2017   K 3.9 01/30/2017   CL 104 01/30/2017   CREATININE 0.94 01/30/2017   BUN 21 01/30/2017   CO2 27 01/30/2017   TSH 1.16 01/30/2017   INR 1.11 07/28/2016   HGBA1C 5.9 01/30/2017      Assessment & Plan:

## 2017-01-30 NOTE — Patient Instructions (Addendum)
Ok to change the losartn HCT 100/12.5 to the losartan 100/25 mg per day  Please continue all other medications as before, and refills have been done if requested.  Please have the pharmacy call with any other refills you may need.  Please continue your efforts at being more active, low cholesterol diet, and weight control.  You are otherwise up to date with prevention measures today.  Please keep your appointments with your specialists as you may have planned  Please return in 1 year for your yearly visit, or sooner if needed, with Lab testing done 3-5 days before

## 2017-01-30 NOTE — Assessment & Plan Note (Signed)
Pt has transitioned back to losartan 100/25, ok to continue this

## 2017-02-05 ENCOUNTER — Telehealth: Payer: Self-pay | Admitting: Internal Medicine

## 2017-02-05 MED ORDER — CYCLOBENZAPRINE HCL 5 MG PO TABS: 5.0000 mg | ORAL_TABLET | Freq: Three times a day (TID) | ORAL | 1 refills | Status: DC | PRN

## 2017-02-05 NOTE — Telephone Encounter (Signed)
Copied from [place] [id number]. Topic: General - Other >> Feb 05, 2017  2:42 PM Darl Householder, RMA wrote: Reason for CRM: patient is requesting a prescription for a muscle relaxer please return pt call

## 2017-02-05 NOTE — Telephone Encounter (Signed)
Done erx 

## 2017-02-06 ENCOUNTER — Ambulatory Visit
Admission: RE | Admit: 2017-02-06 | Discharge: 2017-02-06 | Disposition: A | Discharge status: Home / Self Care | Payer: BLUE CROSS/BLUE SHIELD | Source: Ambulatory Visit | Attending: Internal Medicine | Admitting: Internal Medicine

## 2017-02-06 DIAGNOSIS — Z1231 Encounter for screening mammogram for malignant neoplasm of breast: Secondary | ICD-10-CM

## 2017-02-13 NOTE — Telephone Encounter (Signed)
Left message for pt to call again regarding the below, I left a detailed message on voicemail stating to schedule c&b

## 2017-02-17 NOTE — Telephone Encounter (Signed)
Pt scheduled on 02/28/17

## 2017-02-20 DIAGNOSIS — N3020 Other chronic cystitis without hematuria: Secondary | ICD-10-CM | POA: Diagnosis not present

## 2017-02-20 DIAGNOSIS — R300 Dysuria: Secondary | ICD-10-CM | POA: Diagnosis not present

## 2017-02-27 ENCOUNTER — Ambulatory Visit (INDEPENDENT_AMBULATORY_CARE_PROVIDER_SITE_OTHER): Payer: BLUE CROSS/BLUE SHIELD | Admitting: Gynecology

## 2017-02-27 ENCOUNTER — Encounter: Payer: Self-pay | Admitting: Gynecology

## 2017-02-27 VITALS — BP 134/80

## 2017-02-27 DIAGNOSIS — Z87440 Personal history of urinary (tract) infections: Secondary | ICD-10-CM | POA: Diagnosis not present

## 2017-02-27 DIAGNOSIS — N951 Menopausal and female climacteric states: Secondary | ICD-10-CM

## 2017-02-27 DIAGNOSIS — N950 Postmenopausal bleeding: Secondary | ICD-10-CM | POA: Diagnosis not present

## 2017-02-27 LAB — URINALYSIS W MICROSCOPIC + REFLEX CULTURE
Bacteria, UA: NONE SEEN /HPF
Bilirubin Urine: NEGATIVE
Glucose, UA: NEGATIVE
Hgb urine dipstick: NEGATIVE
Hyaline Cast: NONE SEEN /LPF
Ketones, ur: NEGATIVE
Leukocyte Esterase: NEGATIVE
Nitrites, Initial: NEGATIVE
Protein, ur: NEGATIVE
RBC / HPF: NONE SEEN /HPF (ref 0–2)
Specific Gravity, Urine: 1.025 (ref 1.001–1.03)
WBC, UA: NONE SEEN /HPF (ref 0–5)
pH: 5 (ref 5.0–8.0)

## 2017-02-27 NOTE — Progress Notes (Signed)
    [name] [birth year] [id number]        57 y.o.  G1P1 presents for colposcopy.  Was seen in October with a history of intermittent vaginal staining that was thought to be secondary to atrophic/irritative changes.  Patient status post TAH in the past for leiomyoma.  Exam was normal and Pap smear was negative.  I did recommend colposcopy just to make sure there is a more subtle lesion we were not missing.  She presents now for that.  History of colovesical fistula.  Has intermittent chronic bladder pain for which she is seen by the urologist.  She also requested to do a urine analysis today.  Lastly she is having some menopausal symptoms to include hot flushes and sweats.  Asked if I could check a hormone level to see if she is into menopause.  Past medical history,surgical history, problem list, medications, allergies, family history and social history were all reviewed and documented in the EPIC chart.  Directed ROS with pertinent positives and negatives documented in the history of present illness/assessment and plan.  Exam: Caryn Bee assistant Vitals:   02/27/17 1454  BP: 134/80   General appearance:  Normal Abdomen obese without gross masses or tenderness. Pelvic external BUS vagina with mild atrophic changes.  No gross lesions seen.  Bimanual exam without lesions palpated.  No gross masses or tenderness.  Colposcopy performed after acetic acid cleanse along the entire vaginal length with no abnormality seen.  Assessment/Plan:  57 y.o. G1P1 with:  1. Vaginal staining in October with no history of recurrence since.  Colposcopy is negative for lesions.  Pap smear was negative gross exam with no abnormalities visualized or palpated.  Recommend observation for now and represent if she has any further bleeding. 2. Menopausal symptoms.  Recently had TSH checked.  Will check FSH.  Discussed options for management to include HRT.  At this point patient is not interested in HRT.  We  discussed the risks to include thrombosis such as stroke heart attack DVT and breast cancer issue. 3. History of colovesical fistula with chronic bladder type pain.  She is being followed by urology.  Urine analysis was checked today is negative.  She will follow-up with them in reference to her bladder pain.    Anastasio Auerbach MD, 3:36 PM 02/27/2017

## 2017-02-27 NOTE — Patient Instructions (Signed)
Follow-up when due for annual exam, sooner if any issues or bleeding.

## 2017-02-28 LAB — FOLLICLE STIMULATING HORMONE: FSH: 55.7 m[IU]/mL

## 2017-03-04 DIAGNOSIS — K632 Fistula of intestine: Secondary | ICD-10-CM | POA: Diagnosis not present

## 2017-03-04 DIAGNOSIS — R310 Gross hematuria: Secondary | ICD-10-CM | POA: Diagnosis not present

## 2017-03-04 DIAGNOSIS — N3020 Other chronic cystitis without hematuria: Secondary | ICD-10-CM | POA: Diagnosis not present

## 2017-03-04 DIAGNOSIS — R300 Dysuria: Secondary | ICD-10-CM | POA: Diagnosis not present

## 2017-03-06 ENCOUNTER — Ambulatory Visit (INDEPENDENT_AMBULATORY_CARE_PROVIDER_SITE_OTHER): Payer: BLUE CROSS/BLUE SHIELD | Admitting: Gynecology

## 2017-03-06 ENCOUNTER — Encounter: Payer: Self-pay | Admitting: Gynecology

## 2017-03-06 ENCOUNTER — Telehealth: Payer: Self-pay | Admitting: *Deleted

## 2017-03-06 VITALS — BP 124/80

## 2017-03-06 DIAGNOSIS — N939 Abnormal uterine and vaginal bleeding, unspecified: Secondary | ICD-10-CM | POA: Diagnosis not present

## 2017-03-06 DIAGNOSIS — R310 Gross hematuria: Secondary | ICD-10-CM | POA: Diagnosis not present

## 2017-03-06 NOTE — Telephone Encounter (Signed)
Patient had colposcopy on 02/27/17 due to intermittent vaginal bleeding,pt said bleeding started again today with small clots, pt said this is not new, happens off/on typically last from 1-2 days. No pain.  Patient wanted you to be aware of this. Please advise

## 2017-03-06 NOTE — Telephone Encounter (Signed)
Patient is on here way now.

## 2017-03-06 NOTE — Telephone Encounter (Signed)
Office visit now if possible while she is bleeding

## 2017-03-06 NOTE — Progress Notes (Signed)
    [name] Nov 11, 1960 [id number]        57 y.o.  G1P1 presents with a history of vaginal bleeding.  Status post hysterectomy in the past for leiomyoma.  The past several years she relates having had several episodes of vaginal bleeding.  Last episode was in October.  I colposcoped her 02/27/2017 with negative findings.  I asked her to call whenever she would have a recurrent episode while bleeding to let me take a look and she started bleeding today passing clots.  Recently had urologic exam as she is being followed up for colovesical fistula.  She has a CT scan scheduled.  She is having no pain with this bleeding.  Pap smear October 2018 was negative  Past medical history,surgical history, problem list, medications, allergies, family history and social history were all reviewed and documented in the EPIC chart.  Directed ROS with pertinent positives and negatives documented in the history of present illness/assessment and plan.  Exam: Caryn Bee assistant Vitals:   03/06/17 1628  BP: 124/80   General appearance:  Normal Abdomen obese with no masses guarding rebound organomegaly Pelvic external BUS vagina with atrophic changes.  Blood staining along the entire vaginal vault.  No gross areas of bleeding noted.  Bimanual exam without palpable abnormalities or tenderness.  Colposcopy again performed with no active bleeding areas identified.  Several small erythematous irritative areas noted but they were not bleeding nor looked as if they had been.  Assessment/Plan:  57 y.o. G1P1 with recurrent vaginal bleeding status post hysterectomy in the past.  Exam today does show blood within the vagina and after clearing with a proctoscopy swab no further bleeding noted.  No source areas were identified.  Recommended patient follow-up with gynecologic oncologist for their colposcopic evaluation to see if they cannot find a source of the bleeding.  Discussed with the patient sometimes we never find  a source that may be a small fragile vascular area or chronic granuloma that bleeds every once in a while.  Doubt cancer given no overt evidence on exam and recent Pap smear negative.  Doubt fistulous source as she has no other chronic drainage.  Patient will follow-up with the gynecologic oncologist for their exam and any recommendations for follow-up evaluation.    Anastasio Auerbach MD, 4:56 PM 03/06/2017

## 2017-03-06 NOTE — Patient Instructions (Signed)
Office will call you to arrange the appointment to see the other doctors in reference to your vaginal bleeding

## 2017-03-09 ENCOUNTER — Telehealth: Payer: Self-pay | Admitting: *Deleted

## 2017-03-09 NOTE — Telephone Encounter (Signed)
Called and left [name] a message that the new patient appt is on February 12th at 8:30am

## 2017-03-09 NOTE — Telephone Encounter (Signed)
-----   Message from Anastasio Auerbach, MD sent at 03/06/2017  5:01 PM EST ----- Schedule appointment with gynecologic oncologist.  No rush.  Reference recurrent vaginal bleeding after hysterectomy for benign indications.  Unable to identify source

## 2017-03-09 NOTE — Telephone Encounter (Signed)
Called GYN oncology and left a message for them to call me back with patient scheduled appointment and I will relay to patient.

## 2017-03-10 NOTE — Telephone Encounter (Signed)
Pt called back and said leave detailed message on cell # this was done with [phone] to call if needed

## 2017-03-10 NOTE — Telephone Encounter (Signed)
Pt scheduled on 03/24/17 @ 8:15am with Dr. Aldean Ast, left message for pt to call .

## 2017-03-13 DIAGNOSIS — N200 Calculus of kidney: Secondary | ICD-10-CM | POA: Diagnosis not present

## 2017-03-13 DIAGNOSIS — R300 Dysuria: Secondary | ICD-10-CM | POA: Diagnosis not present

## 2017-03-13 DIAGNOSIS — K632 Fistula of intestine: Secondary | ICD-10-CM | POA: Diagnosis not present

## 2017-03-24 ENCOUNTER — Encounter: Payer: Self-pay | Admitting: Gynecology

## 2017-03-24 ENCOUNTER — Inpatient Hospital Stay: Payer: BLUE CROSS/BLUE SHIELD | Attending: Gynecology | Admitting: Gynecology

## 2017-03-24 VITALS — BP 150/92 | HR 86 | Temp 98.1°F | Resp 20 | Ht 62.0 in | Wt 294.9 lb

## 2017-03-24 DIAGNOSIS — F17210 Nicotine dependence, cigarettes, uncomplicated: Secondary | ICD-10-CM | POA: Diagnosis not present

## 2017-03-24 DIAGNOSIS — R310 Gross hematuria: Secondary | ICD-10-CM | POA: Diagnosis not present

## 2017-03-24 DIAGNOSIS — Z87891 Personal history of nicotine dependence: Secondary | ICD-10-CM | POA: Insufficient documentation

## 2017-03-24 DIAGNOSIS — N939 Abnormal uterine and vaginal bleeding, unspecified: Secondary | ICD-10-CM | POA: Diagnosis not present

## 2017-03-24 DIAGNOSIS — Z90710 Acquired absence of both cervix and uterus: Secondary | ICD-10-CM | POA: Diagnosis not present

## 2017-03-24 DIAGNOSIS — R300 Dysuria: Secondary | ICD-10-CM | POA: Diagnosis not present

## 2017-03-24 DIAGNOSIS — K632 Fistula of intestine: Secondary | ICD-10-CM | POA: Diagnosis not present

## 2017-03-24 NOTE — Patient Instructions (Signed)
Plan on having a transvaginal ultrasound to evaluate area at the top of the vagina.  We will contact you with the results.  Please call for any questions or concerns.

## 2017-03-24 NOTE — Progress Notes (Signed)
Consult Note: Gyn-Onc   [name] 57 y.o. female  Chief Complaint  Patient presents with  . Vaginal Bleeding    Assessment : I believe I palpate a fullness/mass at the vaginal apex.  I have reviewed the patient's recent CT scan and it is my impression that there is some compression on the bladder from a mass that is likely adjacent to the vagina.  Plan: I would like to further evaluate the patient's pelvic anatomy and especially evaluate whether there is a mass at the apex of the vagina that might be the source of this intermittent bleeding.  We will schedule a transvaginal ultrasound asking the radiologist to focus on whether they can identify mass at the vaginal apex.    HPI: 57 year old who is seen in consultation at the request of Dr. Phineas Real regarding vaginal bleeding.  Patient reports that the vaginal bleeding started approximately 4 years ago.  It is very intermittent sometimes with just 1 day of spotting but on occasion some heavy clots.  It is not associated with sexual intercourse.  She has been evaluated by Dr. Phineas Real on several visits and on the most recent visit blood in the vagina was seen although no source of bleeding was noted.  She has had a Pap smear that was normal.  Patient has a past history of undergoing hysterectomy for fibroids.  Her ovaries remain in place.  In August 2018 the patient was admitted with sepsis associated with a Colo- vesicle fistula.  A drain was placed and the patient was treated with antibiotics.  A most recent CT scan (March 13, 2017) apparently shows resolution of the fistula.  It is noted that at the site of the prior fistula between the sigmoid colon and the urinary bladder there is a bandlike density which "probably represents residual fistula".  It is thought that this might also be scarring given the lack of internal gas.  Currently the patient denies any pelvic pain and has no fevers.  Review of Systems:10 point review of systems is  negative except as noted in interval history.   Vitals: Blood pressure (!) 160/100, pulse 86, temperature 98.1 F (36.7 C), resp. rate 20, height 5\' 2"  (1.575 m), weight 294 lb 14.4 oz (133.8 kg), SpO2 100 %.  Physical Exam: General : The patient is a healthy woman in no acute distress.  HEENT: normocephalic, extraoccular movements normal; neck is supple without thyromegally  Lynphnodes: Supraclavicular and inguinal nodes not enlarged  Abdomen: Soft, non-tender, no ascites, no organomegally, no masses, no hernias  Pelvic:  EGBUS: Normal female  Vagina: Normal, no lesions, no blood is noted.  It is reasonably well estrogenized. Urethra and Bladder: Normal, non-tender  Cervix: Surgically absent  Uterus: Surgically absent  Bi-manual examination: At the apex of the vagina believe I feel a masslike effect and fullness of approximately 3 cm in diameter.  This is nontender. Rectal: normal sphincter tone, no masses, no blood  Lower extremities: No edema or varicosities. Normal range of motion  Procedure note, colposcopy: The entire vagina is visualized with the colposcope and no lesions are noted.     Allergies  Allergen Reactions  . Morphine Itching  . Shrimp [Shellfish Allergy] Itching    Tongue burns  . Tramadol Other (See Comments)    confusion  . Latex Rash    Past Medical History:  Diagnosis Date  . Alcohol abuse    abuse- moderate years ago - states only drinks now on weekends -2 to 8  driinks   . Asthma   . COLONIC POLYPS, HX OF 04/05/2010  . DIVERTICULITIS, HX OF 04/05/2010  . DJD (degenerative joint disease)    right knee, mot to severe  . GERD (gastroesophageal reflux disease)    no meds  . Heart murmur    hx of   . Hyperlipidemia   . Hypertension   . Impaired glucose tolerance 12/06/2013  . Obesity (BMI 30-39.9)   . PALPITATIONS, HX OF 09/14/2007    Past Surgical History:  Procedure Laterality Date  . ABDOMINAL HYSTERECTOMY  age 41   fibroids  . COLONOSCOPY  WITH PROPOFOL N/A 07/25/2016   Procedure: COLONOSCOPY WITH PROPOFOL;  Surgeon: Carol Ada, MD;  Location: [facility];  Service: Endoscopy;  Laterality: N/A;  . colonscopy     x 2  . IR RADIOLOGIST EVAL & MGMT  08/12/2016  . IR RADIOLOGIST EVAL & MGMT  08/21/2016  . KNEE ARTHROSCOPY     left   . TOTAL KNEE ARTHROPLASTY  07/29/2011   Procedure: TOTAL KNEE ARTHROPLASTY;  Surgeon: Mauri Pole, MD;  Location: [facility];  Service: Orthopedics;  Laterality: Right;  . TOTAL KNEE ARTHROPLASTY Left 12/14/2012   Procedure: LEFT TOTAL KNEE ARTHROPLASTY;  Surgeon: Mauri Pole, MD;  Location: [facility];  Service: Orthopedics;  Laterality: Left;    Current Outpatient Medications  Medication Sig Dispense Refill  . albuterol (PROVENTIL HFA;VENTOLIN HFA) 108 (90 Base) MCG/ACT inhaler Inhale 1-2 puffs into the lungs every 6 (six) hours as needed for wheezing. 1 Inhaler 5  . atorvastatin (LIPITOR) 10 MG tablet TAKE 1 TABLET BY MOUTH EVERY DAY 90 tablet 1  . Fluticasone-Salmeterol (ADVAIR DISKUS) 250-50 MCG/DOSE AEPB Inhale 1 puff 2 (two) times daily into the lungs. 3 each 2  . losartan-hydrochlorothiazide (HYZAAR) 100-25 MG tablet Take 1 tablet by mouth daily. 90 tablet 3  . potassium chloride (K-DUR) 10 MEQ tablet Take 1 tablet (10 mEq total) by mouth daily. 90 tablet 3  . cyclobenzaprine (FLEXERIL) 5 MG tablet Take 1 tablet (5 mg total) by mouth 3 (three) times daily as needed for muscle spasms. (Patient not taking: Reported on 02/27/2017) 30 tablet 1   No current facility-administered medications for this visit.     Social History   Socioeconomic History  . Marital status: Married    Spouse name: Not on file  . Number of children: 1  . Years of education: Not on file  . Highest education level: Not on file  Social Needs  . Financial resource strain: Not on file  . Food insecurity - worry: Not on file  . Food insecurity - inability: Not on file  . Transportation needs - medical: Not on file  .  Transportation needs - non-medical: Not on file  Occupational History  . Occupation: Scientist, forensic: [place]  Tobacco Use  . Smoking status: Former Smoker    Packs/day: 0.50    Years: 7.00    Pack years: 3.50    Types: Cigarettes    Last attempt to quit: 02/11/1988    Years since quitting: 29.1  . Smokeless tobacco: Never Used  Substance and Sexual Activity  . Alcohol use: Yes    Comment: Occas  . Drug use: No    Comment: smoked marajuania x 10 years.  Quit in 1985.  Marland Kitchen Sexual activity: Yes    Birth control/protection: Surgical    Comment: 1st intercourse 57 yo-Fewer than 5 partners  Other Topics Concern  . Not on file  Social History Narrative   Environmental consultant on file.  [name] April 05, 2010 12:04 PM    Family History  Problem Relation Age of Onset  . Stroke Mother   . COPD Father   . Lymphoma Sister       Marti Sleigh, MD 03/24/2017, 9:05 AM

## 2017-03-26 ENCOUNTER — Other Ambulatory Visit: Payer: Self-pay | Admitting: Internal Medicine

## 2017-03-27 ENCOUNTER — Ambulatory Visit (HOSPITAL_COMMUNITY): Payer: BLUE CROSS/BLUE SHIELD

## 2017-03-27 ENCOUNTER — Ambulatory Visit (HOSPITAL_COMMUNITY)
Admission: RE | Admit: 2017-03-27 | Discharge: 2017-03-27 | Disposition: A | Discharge status: Home / Self Care | Payer: BLUE CROSS/BLUE SHIELD | Source: Ambulatory Visit | Attending: Gynecologic Oncology | Admitting: Gynecologic Oncology

## 2017-03-27 DIAGNOSIS — N939 Abnormal uterine and vaginal bleeding, unspecified: Secondary | ICD-10-CM | POA: Insufficient documentation

## 2017-03-27 DIAGNOSIS — N83202 Unspecified ovarian cyst, left side: Secondary | ICD-10-CM | POA: Diagnosis not present

## 2017-03-27 DIAGNOSIS — N83201 Unspecified ovarian cyst, right side: Secondary | ICD-10-CM | POA: Insufficient documentation

## 2017-03-30 ENCOUNTER — Telehealth: Payer: Self-pay | Admitting: Gynecologic Oncology

## 2017-03-30 DIAGNOSIS — R1900 Intra-abdominal and pelvic swelling, mass and lump, unspecified site: Secondary | ICD-10-CM

## 2017-03-30 NOTE — Telephone Encounter (Signed)
Patient returned call.  Informed of [place] results with Dr. Mora Bellman recommendations for IR aspiration of the cystic area.  All questions answered.  Advised she would receive a phone call from the hospital to arrange.  Advised to call for any needs.

## 2017-03-31 ENCOUNTER — Other Ambulatory Visit: Payer: Self-pay | Admitting: Gynecologic Oncology

## 2017-03-31 DIAGNOSIS — R1907 Generalized intra-abdominal and pelvic swelling, mass and lump: Secondary | ICD-10-CM

## 2017-03-31 NOTE — Progress Notes (Signed)
Left message for patient asking her to please call the office. Plan for MRI to look closer at the area at the vaginal cuff since IR is unable to bx the area due to body habitus.  Per Dr. Fermin Schwab, pt may need to go to the OR to perform aspiration transvaginally.

## 2017-04-01 ENCOUNTER — Telehealth: Payer: Self-pay | Admitting: *Deleted

## 2017-04-01 NOTE — Telephone Encounter (Signed)
Scheduled the patient for a MRI scan. Appt scheduled for Saturday February 22nd at 4pm, arrive at 3:30pm.   Called and left the patient a message with the following information.

## 2017-04-04 ENCOUNTER — Ambulatory Visit (HOSPITAL_COMMUNITY)
Admission: RE | Admit: 2017-04-04 | Discharge: 2017-04-04 | Disposition: A | Discharge status: Home / Self Care | Payer: BLUE CROSS/BLUE SHIELD | Source: Ambulatory Visit | Attending: Gynecologic Oncology | Admitting: Gynecologic Oncology

## 2017-04-04 ENCOUNTER — Ambulatory Visit (HOSPITAL_COMMUNITY): Admission: RE | Admit: 2017-04-04 | Payer: BLUE CROSS/BLUE SHIELD | Source: Ambulatory Visit

## 2017-04-04 DIAGNOSIS — R1907 Generalized intra-abdominal and pelvic swelling, mass and lump: Secondary | ICD-10-CM

## 2017-04-06 ENCOUNTER — Other Ambulatory Visit: Payer: Self-pay | Admitting: Gynecologic Oncology

## 2017-04-06 ENCOUNTER — Telehealth: Payer: Self-pay | Admitting: *Deleted

## 2017-04-06 DIAGNOSIS — N939 Abnormal uterine and vaginal bleeding, unspecified: Secondary | ICD-10-CM

## 2017-04-06 NOTE — Telephone Encounter (Signed)
Returned the patient's call, patient was unable to receive her MRI. Patient request that her MRI be scheduled at [facility] Orders in and patient will call to set up appt herself.

## 2017-04-08 ENCOUNTER — Telehealth: Payer: Self-pay | Admitting: Gynecologic Oncology

## 2017-04-08 ENCOUNTER — Ambulatory Visit
Admission: RE | Admit: 2017-04-08 | Discharge: 2017-04-08 | Disposition: A | Discharge status: Home / Self Care | Payer: BLUE CROSS/BLUE SHIELD | Source: Ambulatory Visit | Attending: Gynecologic Oncology | Admitting: Gynecologic Oncology

## 2017-04-08 DIAGNOSIS — N939 Abnormal uterine and vaginal bleeding, unspecified: Secondary | ICD-10-CM

## 2017-04-08 MED ORDER — GADOBENATE DIMEGLUMINE 529 MG/ML IV SOLN
20.0000 mL | Freq: Once | INTRAVENOUS | Status: AC | PRN
  Administered 2017-04-08: 20 mL via INTRAVENOUS

## 2017-04-08 NOTE — Telephone Encounter (Signed)
Returned call to patient and left a message on her voicemail per her request.  Discussed current plan for MRI and we would contact her with the results and Dr. Mora Bellman recommendations.  Advised her to please call for any needs or concerns.

## 2017-04-09 ENCOUNTER — Telehealth: Payer: Self-pay | Admitting: Gynecologic Oncology

## 2017-04-09 NOTE — Telephone Encounter (Signed)
Called and left message for patient about MRI results.  Per Dr. Fermin Schwab, plan for an examination under anesthesia with a fine needle aspiration.  Advised patient she would be contacted with that date and time.  Advised to call for any questions or concerns.

## 2017-04-10 ENCOUNTER — Ambulatory Visit (HOSPITAL_COMMUNITY): Payer: BLUE CROSS/BLUE SHIELD

## 2017-04-10 ENCOUNTER — Telehealth: Payer: Self-pay | Admitting: Gynecologic Oncology

## 2017-04-10 NOTE — Telephone Encounter (Signed)
Called patient and left a message about need to schedule biopsy procedure.  Advised Dr. Lunette Stands next availability would be end of March and that we could always try to get her in sooner at [facility].  Advised to call the office back to discuss.

## 2017-04-13 ENCOUNTER — Telehealth: Payer: Self-pay | Admitting: Gynecologic Oncology

## 2017-04-13 NOTE — Telephone Encounter (Signed)
Returned call to patient. All questions answered.  She would like to wait and have her procedure with Dr. Fermin Schwab at the end of March.  Patient would not be able to have surgery at the surgery center due to BMI.  Block time at [facility] will release one week prior to the time.  Will continue to follow.

## 2017-04-14 ENCOUNTER — Emergency Department (HOSPITAL_COMMUNITY)
Admission: EM | Admit: 2017-04-14 | Discharge: 2017-04-14 | Disposition: A | Discharge status: Home / Self Care | Payer: BLUE CROSS/BLUE SHIELD | Attending: Emergency Medicine | Admitting: Emergency Medicine

## 2017-04-14 ENCOUNTER — Telehealth: Payer: Self-pay | Admitting: Gynecologic Oncology

## 2017-04-14 ENCOUNTER — Encounter (HOSPITAL_COMMUNITY): Payer: Self-pay | Admitting: Emergency Medicine

## 2017-04-14 ENCOUNTER — Other Ambulatory Visit: Payer: Self-pay

## 2017-04-14 DIAGNOSIS — Z79899 Other long term (current) drug therapy: Secondary | ICD-10-CM | POA: Diagnosis not present

## 2017-04-14 DIAGNOSIS — Z91040 Latex allergy status: Secondary | ICD-10-CM | POA: Insufficient documentation

## 2017-04-14 DIAGNOSIS — J45909 Unspecified asthma, uncomplicated: Secondary | ICD-10-CM | POA: Insufficient documentation

## 2017-04-14 DIAGNOSIS — R519 Headache, unspecified: Secondary | ICD-10-CM

## 2017-04-14 DIAGNOSIS — R51 Headache: Secondary | ICD-10-CM | POA: Insufficient documentation

## 2017-04-14 DIAGNOSIS — I10 Essential (primary) hypertension: Secondary | ICD-10-CM | POA: Insufficient documentation

## 2017-04-14 DIAGNOSIS — Z87891 Personal history of nicotine dependence: Secondary | ICD-10-CM | POA: Diagnosis not present

## 2017-04-14 LAB — BASIC METABOLIC PANEL
Anion gap: 12 (ref 5–15)
BUN: 17 mg/dL (ref 6–20)
CO2: 24 mmol/L (ref 22–32)
Calcium: 9.4 mg/dL (ref 8.9–10.3)
Chloride: 104 mmol/L (ref 101–111)
Creatinine, Ser: 0.93 mg/dL (ref 0.44–1.00)
GFR calc Af Amer: >60 mL/min (ref >60)
GFR calc non Af Amer: >60 mL/min (ref >60)
Glucose, Bld: 89 mg/dL (ref 65–99)
Potassium: 3.1 mmol/L — ABNORMAL LOW (ref 3.5–5.1)
Sodium: 140 mmol/L (ref 135–145)

## 2017-04-14 LAB — CBC WITH DIFFERENTIAL/PLATELET
Basophils Absolute: 0 10*3/uL (ref 0.0–0.1)
Basophils Relative: 0 %
Eosinophils Absolute: 0.2 10*3/uL (ref 0.0–0.7)
Eosinophils Relative: 2 %
HCT: 43.3 % (ref 36.0–46.0)
Hemoglobin: 14.3 g/dL (ref 12.0–15.0)
Lymphocytes Relative: 27 %
Lymphs Abs: 2.9 10*3/uL (ref 0.7–4.0)
MCH: 29.8 pg (ref 26.0–34.0)
MCHC: 33 g/dL (ref 30.0–36.0)
MCV: 90.2 fL (ref 78.0–100.0)
Monocytes Absolute: 0.8 10*3/uL (ref 0.1–1.0)
Monocytes Relative: 7 %
Neutro Abs: 6.9 10*3/uL (ref 1.7–7.7)
Neutrophils Relative %: 64 %
Platelets: 278 10*3/uL (ref 150–400)
RBC: 4.8 MIL/uL (ref 3.87–5.11)
RDW: 14.2 % (ref 11.5–15.5)
WBC: 10.8 10*3/uL — ABNORMAL HIGH (ref 4.0–10.5)

## 2017-04-14 LAB — I-STAT TROPONIN, ED: Troponin i, poc: 0.01 ng/mL (ref 0.00–0.08)

## 2017-04-14 MED ORDER — POTASSIUM CHLORIDE CRYS ER 20 MEQ PO TBCR
40.0000 meq | EXTENDED_RELEASE_TABLET | Freq: Once | ORAL | Status: AC
  Administered 2017-04-14: 40 meq via ORAL
  Filled 2017-04-14: qty 2

## 2017-04-14 MED ORDER — KETOROLAC TROMETHAMINE 30 MG/ML IJ SOLN
30.0000 mg | Freq: Once | INTRAMUSCULAR | Status: AC
  Administered 2017-04-14: 30 mg via INTRAVENOUS
  Filled 2017-04-14: qty 1

## 2017-04-14 MED ORDER — METOCLOPRAMIDE HCL 5 MG/ML IJ SOLN
10.0000 mg | INTRAMUSCULAR | Status: AC
  Administered 2017-04-14: 10 mg via INTRAVENOUS
  Filled 2017-04-14: qty 2

## 2017-04-14 MED ORDER — BUTALBITAL-APAP-CAFFEINE 50-325-40 MG PO TABS: 1.0000 | ORAL_TABLET | Freq: Three times a day (TID) | ORAL | 0 refills | Status: DC | PRN

## 2017-04-14 NOTE — ED Provider Notes (Signed)
[facility] EMERGENCY DEPARTMENT Provider Note   CSN: [id number] Arrival date & time: 04/14/17  8413     History   Chief Complaint Chief Complaint  Patient presents with  . Headache    HPI [name] is a 57 y.o. female.  57 year old female presents to the emergency department for evaluation of headache.  Patient reports a temporal, throbbing headache which has been intermittent over the past few days.  It was worse today, since the morning.  She states that she was taking sinus medication for symptoms which has provided little relief.  She has not taken any Tylenol or Motrin for her headache.  No history of head trauma or fever, tinnitus, hearing loss, vision loss, nausea, vomiting, photophobia, extremity numbness or paresthesias, extremity weakness.  She reports a history of blurry vision which is ongoing over the past few weeks to months.  She states that she needs glasses and has not yet followed up with an optometrist or ophthalmologist.  She denies any new or worsening blurry vision since onset of her headaches.  She believes that her persistent headaches are secondary to increased stress.      Past Medical History:  Diagnosis Date  . Alcohol abuse    abuse- moderate years ago - states only drinks now on weekends -2 to 8 driinks   . Asthma   . COLONIC POLYPS, HX OF 04/05/2010  . DIVERTICULITIS, HX OF 04/05/2010  . DJD (degenerative joint disease)    right knee, mot to severe  . GERD (gastroesophageal reflux disease)    no meds  . Heart murmur    hx of   . Hyperlipidemia   . Hypertension   . Impaired glucose tolerance 12/06/2013  . Obesity (BMI 30-39.9)   . PALPITATIONS, HX OF 09/14/2007    Patient Active Problem List   Diagnosis Date Noted  . Sepsis ([place]) 09/12/2016  . Colovesical fistula 09/12/2016  . Hypertension 07/28/2016  . Alcohol abuse 07/28/2016  . Obesity (BMI 30-39.9) 07/28/2016  . Abscess of bladder 07/28/2016  . Colonic  diverticulum 07/28/2016  . Acute hypokalemia 07/28/2016  . Hyperlipidemia 07/10/2016  . Mass of left side of neck 07/10/2016  . Head and neck lymphadenopathy 07/10/2016  . Acute sinus infection 01/25/2016  . Eustachian tube disorder 01/25/2016  . Asthma exacerbation 05/18/2015  . Peripheral edema 03/16/2015  . Asthma 03/16/2015  . Right shoulder pain 03/16/2015  . Hypokalemia 10/04/2014  . Upper airway cough syndrome 10/03/2014  . Severe obesity (BMI >= 40) ([place]) 10/03/2014  . Lower back pain 05/25/2014  . Bilateral shoulder pain 05/25/2014  . Chest pain 04/14/2014  . Allergic rhinitis 12/06/2013  . Impaired glucose tolerance 12/06/2013  . Expected blood loss anemia 12/16/2012  . Morbid obesity ([place]) 12/15/2012  . S/P left TKA 12/14/2012  . Goiter 11/26/2012  . Preop exam for internal medicine 11/26/2012  . Diarrhea 02/25/2012  . Vaginal bleeding 04/30/2011  . Colon polyps 02/10/2011  . Eczema 02/10/2011  . Depression 02/10/2011  . Preventative health care 09/27/2010  . Anxiety state 04/05/2010  . DIVERTICULITIS, HX OF 04/05/2010  . PALPITATIONS, HX OF 09/14/2007  . Obesity 04/24/2007  . VOCAL CORD DISORDER 04/24/2007  . Extrinsic asthma 04/24/2007  . GERD 04/24/2007    Past Surgical History:  Procedure Laterality Date  . ABDOMINAL HYSTERECTOMY  age 57   fibroids  . COLONOSCOPY WITH PROPOFOL N/A 07/25/2016   Procedure: COLONOSCOPY WITH PROPOFOL;  Surgeon: Carol Ada, MD;  Location: [facility];  Service: Endoscopy;  Laterality: N/A;  . colonscopy     x 2  . IR RADIOLOGIST EVAL & MGMT  08/12/2016  . IR RADIOLOGIST EVAL & MGMT  08/21/2016  . KNEE ARTHROSCOPY     left   . TOTAL KNEE ARTHROPLASTY  07/29/2011   Procedure: TOTAL KNEE ARTHROPLASTY;  Surgeon: Mauri Pole, MD;  Location: [facility] ORS;  Service: Orthopedics;  Laterality: Right;  . TOTAL KNEE ARTHROPLASTY Left 12/14/2012   Procedure: LEFT TOTAL KNEE ARTHROPLASTY;  Surgeon: Mauri Pole, MD;  Location: [facility] ORS;   Service: Orthopedics;  Laterality: Left;    OB History    Gravida Para Term Preterm AB Living   1 1       1    SAB TAB Ectopic Multiple Live Births                   Home Medications    Prior to Admission medications   Medication Sig Start Date End Date Taking? Authorizing Provider  albuterol (PROVENTIL HFA;VENTOLIN HFA) 108 (90 Base) MCG/ACT inhaler Inhale 1-2 puffs into the lungs every 6 (six) hours as needed for wheezing. 01/06/17  Yes Biagio Borg, MD  atorvastatin (LIPITOR) 10 MG tablet TAKE 1 TABLET BY MOUTH EVERY DAY Patient taking differently: Take 10 mg by mouth once a day 01/05/17  Yes Biagio Borg, MD  Fluticasone-Salmeterol (ADVAIR DISKUS) 250-50 MCG/DOSE AEPB Inhale 1 puff 2 (two) times daily into the lungs. 12/17/16  Yes Biagio Borg, MD  ibuprofen (ADVIL,MOTRIN) 200 MG tablet Take 200 mg by mouth every 6 (six) hours as needed (for pain or headaches).   Yes [provider]  losartan-hydrochlorothiazide (HYZAAR) 100-25 MG tablet Take 1 tablet by mouth daily. 01/30/17  Yes Biagio Borg, MD  butalbital-acetaminophen-caffeine (FIORICET, ESGIC) [phone] MG tablet Take 1-2 tablets by mouth every 8 (eight) hours as needed for headache. 04/14/17 04/14/18  Antonietta Breach, PA-C  cyclobenzaprine (FLEXERIL) 5 MG tablet Take 1 tablet (5 mg total) by mouth 3 (three) times daily as needed for muscle spasms. Patient not taking: Reported on 04/14/2017 02/05/17   Biagio Borg, MD  ibuprofen (ADVIL,MOTRIN) 600 MG tablet TAKE 1 TABLET BY MOUTH EVERY 6 HOURS AS NEEDED Patient not taking: Reported on 04/14/2017 03/27/17   Biagio Borg, MD  potassium chloride (K-DUR) 10 MEQ tablet Take 1 tablet (10 mEq total) by mouth daily. Patient not taking: Reported on 04/14/2017 11/15/16   Biagio Borg, MD    Family History Family History  Problem Relation Age of Onset  . Stroke Mother   . COPD Father   . Lymphoma Sister     Social History Social History   Tobacco Use  . Smoking status: Former  Smoker    Packs/day: 0.50    Years: 7.00    Pack years: 3.50    Types: Cigarettes    Last attempt to quit: 02/11/1988    Years since quitting: 29.1  . Smokeless tobacco: Never Used  Substance Use Topics  . Alcohol use: Yes    Comment: Occas  . Drug use: No    Comment: smoked marajuania x 10 years.  Quit in 1985.     Allergies   Morphine; Shrimp [shellfish allergy]; Tramadol; and Latex   Review of Systems Review of Systems Ten systems reviewed and are negative for acute change, except as noted in the HPI.    Physical Exam Updated Vital Signs BP 126/66   Pulse 92   Temp 98.7  F (37.1 C) (Oral)   Resp 18   Ht 5\' 2"  (1.575 m)   Wt 133.4 kg (294 lb)   SpO2 96%   BMI 53.77 kg/m   Physical Exam  Constitutional: She is oriented to person, place, and time. She appears well-developed and well-nourished. No distress.  Nontoxic appearing, obese.  HENT:  Head: Normocephalic and atraumatic.  Mouth/Throat: Oropharynx is clear and moist.  Symmetric rise of the uvula with phonation.  Eyes: Conjunctivae and EOM are normal. Pupils are equal, round, and reactive to light. No scleral icterus.  Neck: Normal range of motion.  No nuchal rigidity or meningismus  Pulmonary/Chest: Effort normal. No respiratory distress.  Respirations even and unlabored  Musculoskeletal: Normal range of motion.  Neurological: She is alert and oriented to person, place, and time. No cranial nerve deficit. She exhibits normal muscle tone. Coordination normal.  GCS 15. Speech is goal oriented. No cranial nerve deficits appreciated; symmetric eyebrow raise, no facial drooping, tongue midline. Patient has equal grip strength bilaterally with 5/5 strength against resistance in all major muscle groups bilaterally. Sensation to light touch intact. Patient moves extremities without ataxia.  Skin: Skin is warm and dry. No rash noted. She is not diaphoretic. No erythema. No pallor.  Psychiatric: She has a normal mood  and affect. Her behavior is normal.  Nursing note and vitals reviewed.    ED Treatments / Results  Labs (all labs ordered are listed, but only abnormal results are displayed) Labs Reviewed  CBC WITH DIFFERENTIAL/PLATELET - Abnormal; Notable for the following components:      Result Value   WBC 10.8 (*)    All other components within normal limits  BASIC METABOLIC PANEL - Abnormal; Notable for the following components:   Potassium 3.1 (*)    All other components within normal limits  I-STAT TROPONIN, ED    EKG  EKG Interpretation None       Radiology No results found.  Procedures Procedures (including critical care time)  Medications Ordered in ED Medications  potassium chloride SA (K-DUR,KLOR-CON) CR tablet 40 mEq (40 mEq Oral Given 04/14/17 2251)  ketorolac (TORADOL) 30 MG/ML injection 30 mg (30 mg Intravenous Given 04/14/17 2248)  metoCLOPramide (REGLAN) injection 10 mg (10 mg Intravenous Given 04/14/17 2248)    11:23 PM Patient reassessed.  Headache improved from 10/10 down to 2/10 following administration of medications.  Patient states that she is feeling much better.  She is comfortable with discharge.  Will prescribe Fioricet for interval management.   Initial Impression / Assessment and Plan / ED Course  I have reviewed the triage vital signs and the nursing notes.  Pertinent labs & imaging results that were available during my care of the patient were reviewed by me and considered in my medical decision making (see chart for details).     Patient presents to the emergency department for evaluation of headache which began a few days ago, worse since this morning.  Symptoms intermittent since onset, throbbing.  Patient with no history of recent head injury or trauma.  No fever, nuchal rigidity, meningismus to suggest meningitis.  Neurologic exam today is nonfocal.  On reassessment, the patient has had significant improvement in headache symptoms following a migraine  cocktail.  I do not believe further emergent workup is indicated at this time.  Return precautions discussed and provided.  Patient discharged in stable condition with no unaddressed concerns.   Final Clinical Impressions(s) / ED Diagnoses   Final diagnoses:  Bad headache  ED Discharge Orders        Ordered    butalbital-acetaminophen-caffeine (FIORICET, ESGIC) 50-325-40 MG tablet  Every 8 hours PRN     04/14/17 2320       Antonietta Breach, Hershal Coria 04/14/17 2327    Tegeler, Gwenyth Allegra, MD 04/15/17 [phone]

## 2017-04-14 NOTE — Progress Notes (Signed)
Pt is AO x 4 no  Neuro deficit noticed on triage.

## 2017-04-14 NOTE — Discharge Instructions (Signed)
For sleep, we recommend either Unisom or melatonin which can be purchased over-the-counter at your local pharmacy.  If your headache persists, you may take Fioricet as prescribed.  Continue your daily medications as prescribed by your primary care doctor.  You may return to the emergency department, as needed, for new or concerning symptoms.

## 2017-04-14 NOTE — ED Triage Notes (Signed)
Pt states she is in a lot of stress having HA for the past few days getting worse today, no nausea or vomiting.

## 2017-04-14 NOTE — Telephone Encounter (Signed)
Returned call to patient.  Left message and answered questions.  Advised to call for any needs.

## 2017-04-20 ENCOUNTER — Telehealth: Payer: Self-pay | Admitting: Gynecologic Oncology

## 2017-04-23 ENCOUNTER — Telehealth: Payer: Self-pay | Admitting: Gynecologic Oncology

## 2017-04-23 DIAGNOSIS — R1900 Intra-abdominal and pelvic swelling, mass and lump, unspecified site: Secondary | ICD-10-CM

## 2017-04-23 DIAGNOSIS — R1907 Generalized intra-abdominal and pelvic swelling, mass and lump: Secondary | ICD-10-CM

## 2017-04-23 NOTE — Telephone Encounter (Signed)
Duplicate

## 2017-04-23 NOTE — Telephone Encounter (Signed)
Returned call to patient.  The patient had called earlier in the week stating her vaginal bleeding had stopped and she wanted to know could she wait on having the biopsy and have a repeat MRI to see how things looked first.  Reached out to Dr. Fermin Schwab at [facility] who was agreeable with the patient desires. Per Dr. Judeth Porch, plan for repeat MRI to see if there is any change prior to proceeding with biopsy in the OR.    Patient stating the bleeding has stopped since last Thursday.  She states she has been taking ibuprofen and was wondering if that may have decreased inflammation which caused the bleeding to stop.  Advised patient we would arrange an MRI at the end of the month then follow up on the results.  Agreeable with the plan.  Advised she would be contacted with the information.

## 2017-04-24 ENCOUNTER — Telehealth: Payer: Self-pay | Admitting: *Deleted

## 2017-04-24 NOTE — Telephone Encounter (Signed)
Called and left a message for the patient with the phone number to call [facility] for her MRI. Asked the patient to schedule it for March 22nd and follow up with Dr. Fermin Schwab on March 25th at 2pm.

## 2017-04-28 ENCOUNTER — Telehealth: Payer: Self-pay | Admitting: *Deleted

## 2017-04-28 NOTE — Telephone Encounter (Signed)
Called [facility] and moved the patient's Mri from 3/29 to 3/24. Patient called and given information

## 2017-04-30 ENCOUNTER — Emergency Department (HOSPITAL_COMMUNITY): Payer: BLUE CROSS/BLUE SHIELD

## 2017-04-30 ENCOUNTER — Other Ambulatory Visit: Payer: Self-pay

## 2017-04-30 DIAGNOSIS — Z79899 Other long term (current) drug therapy: Secondary | ICD-10-CM | POA: Insufficient documentation

## 2017-04-30 DIAGNOSIS — Z96652 Presence of left artificial knee joint: Secondary | ICD-10-CM | POA: Insufficient documentation

## 2017-04-30 DIAGNOSIS — R188 Other ascites: Secondary | ICD-10-CM | POA: Diagnosis not present

## 2017-04-30 DIAGNOSIS — Z91040 Latex allergy status: Secondary | ICD-10-CM | POA: Diagnosis not present

## 2017-04-30 DIAGNOSIS — E876 Hypokalemia: Secondary | ICD-10-CM | POA: Diagnosis not present

## 2017-04-30 DIAGNOSIS — T502X5A Adverse effect of carbonic-anhydrase inhibitors, benzothiadiazides and other diuretics, initial encounter: Secondary | ICD-10-CM | POA: Insufficient documentation

## 2017-04-30 DIAGNOSIS — J45909 Unspecified asthma, uncomplicated: Secondary | ICD-10-CM | POA: Insufficient documentation

## 2017-04-30 DIAGNOSIS — Z96651 Presence of right artificial knee joint: Secondary | ICD-10-CM | POA: Insufficient documentation

## 2017-04-30 DIAGNOSIS — M549 Dorsalgia, unspecified: Secondary | ICD-10-CM | POA: Insufficient documentation

## 2017-04-30 DIAGNOSIS — M546 Pain in thoracic spine: Secondary | ICD-10-CM | POA: Diagnosis not present

## 2017-04-30 DIAGNOSIS — Z87891 Personal history of nicotine dependence: Secondary | ICD-10-CM | POA: Insufficient documentation

## 2017-04-30 DIAGNOSIS — I10 Essential (primary) hypertension: Secondary | ICD-10-CM | POA: Insufficient documentation

## 2017-04-30 DIAGNOSIS — R079 Chest pain, unspecified: Secondary | ICD-10-CM | POA: Diagnosis not present

## 2017-04-30 LAB — I-STAT TROPONIN, ED: Troponin i, poc: 0 ng/mL (ref 0.00–0.08)

## 2017-04-30 LAB — BASIC METABOLIC PANEL
Anion gap: 12 (ref 5–15)
BUN: 23 mg/dL — ABNORMAL HIGH (ref 6–20)
CO2: 23 mmol/L (ref 22–32)
Calcium: 9.4 mg/dL (ref 8.9–10.3)
Chloride: 104 mmol/L (ref 101–111)
Creatinine, Ser: 1 mg/dL (ref 0.44–1.00)
GFR calc Af Amer: >60 mL/min (ref >60)
GFR calc non Af Amer: >60 mL/min (ref >60)
Glucose, Bld: 95 mg/dL (ref 65–99)
Potassium: 3.3 mmol/L — ABNORMAL LOW (ref 3.5–5.1)
Sodium: 139 mmol/L (ref 135–145)

## 2017-04-30 LAB — CBC
HCT: 43.9 % (ref 36.0–46.0)
Hemoglobin: 14.3 g/dL (ref 12.0–15.0)
MCH: 29.7 pg (ref 26.0–34.0)
MCHC: 32.6 g/dL (ref 30.0–36.0)
MCV: 91.1 fL (ref 78.0–100.0)
Platelets: 261 10*3/uL (ref 150–400)
RBC: 4.82 MIL/uL (ref 3.87–5.11)
RDW: 14.2 % (ref 11.5–15.5)
WBC: 12.7 10*3/uL — ABNORMAL HIGH (ref 4.0–10.5)

## 2017-04-30 LAB — I-STAT BETA HCG BLOOD, ED (MC, WL, AP ONLY): I-stat hCG, quantitative: 6.9 m[IU]/mL — ABNORMAL HIGH (ref <5)

## 2017-04-30 MED ORDER — HYDROCODONE-ACETAMINOPHEN 5-325 MG PO TABS
1.0000 | ORAL_TABLET | Freq: Once | ORAL | Status: AC
  Administered 2017-04-30: 1 via ORAL
  Filled 2017-04-30: qty 1

## 2017-04-30 MED ORDER — ACETAMINOPHEN 325 MG PO TABS: 650.0000 mg | ORAL_TABLET | Freq: Once | ORAL | Status: DC

## 2017-04-30 NOTE — ED Triage Notes (Signed)
To ED for eval of chest tightness and sob that started while sitting on bus to come home from work. States she has recently suffered from anxiety. Skin w/d, resp e/u

## 2017-04-30 NOTE — ED Provider Notes (Signed)
Patient placed in Quick Look pathway, seen and evaluated   Chief Complaint: chest pain  HPI:   Patient presents with sudden onset nonradiating sharp intermittent left-sided chest pain underneath her left breast and thoracic back pain while on the bus on her way home from work.  Denies pleuritic component or shortness of breath.  No history of DVT/PE, prolonged immobilization, recent surgery, cough, hemoptysis, estrogen use.  Patient is under a lot of stress lately with multiple procedures to evaluate abnormal vaginal bleeding.  Is concerned she may have cancer and has to do repeat MRI.  She has not tried anything for her pain at this time.  ROS: Chest pain, no shortness of breath, nausea or vomiting.  Physical Exam:   Gen: No distress  Neuro: Awake and Alert  Skin: Warm    Focused Exam: Pain is not reproducible on palpation, no rash noted, lungs are clear to auscultation bilaterally.  Patient is otherwise well-appearing, nontoxic afebrile.   Initiation of care has begun. The patient has been counseled on the process, plan, and necessity for staying for the completion/evaluation, and the remainder of the medical screening examination    Dossie Der 04/30/17 1939    LongWonda Olds, MD 05/01/17 [phone]

## 2017-05-01 ENCOUNTER — Emergency Department (HOSPITAL_COMMUNITY)
Admission: EM | Admit: 2017-05-01 | Discharge: 2017-05-01 | Disposition: A | Discharge status: Home / Self Care | Payer: BLUE CROSS/BLUE SHIELD | Attending: Emergency Medicine | Admitting: Emergency Medicine

## 2017-05-01 ENCOUNTER — Emergency Department (HOSPITAL_COMMUNITY): Payer: BLUE CROSS/BLUE SHIELD

## 2017-05-01 DIAGNOSIS — T502X5A Adverse effect of carbonic-anhydrase inhibitors, benzothiadiazides and other diuretics, initial encounter: Secondary | ICD-10-CM

## 2017-05-01 DIAGNOSIS — E876 Hypokalemia: Secondary | ICD-10-CM

## 2017-05-01 DIAGNOSIS — M549 Dorsalgia, unspecified: Secondary | ICD-10-CM

## 2017-05-01 DIAGNOSIS — R188 Other ascites: Secondary | ICD-10-CM | POA: Diagnosis not present

## 2017-05-01 LAB — URINALYSIS, ROUTINE W REFLEX MICROSCOPIC
Bilirubin Urine: NEGATIVE
Glucose, UA: NEGATIVE mg/dL
Ketones, ur: NEGATIVE mg/dL
Nitrite: NEGATIVE
Protein, ur: 30 mg/dL — AB
Specific Gravity, Urine: 1.035 — ABNORMAL HIGH (ref 1.005–1.030)
pH: 5 (ref 5.0–8.0)

## 2017-05-01 LAB — I-STAT TROPONIN, ED: Troponin i, poc: 0 ng/mL (ref 0.00–0.08)

## 2017-05-01 MED ORDER — POTASSIUM CHLORIDE CRYS ER 20 MEQ PO TBCR: 20.0000 meq | EXTENDED_RELEASE_TABLET | Freq: Every day | ORAL | 0 refills | Status: DC

## 2017-05-01 MED ORDER — HYDROCODONE-ACETAMINOPHEN 5-325 MG PO TABS
1.0000 | ORAL_TABLET | Freq: Once | ORAL | Status: AC
  Administered 2017-05-01: 1 via ORAL
  Filled 2017-05-01: qty 1

## 2017-05-01 MED ORDER — GADOBENATE DIMEGLUMINE 529 MG/ML IV SOLN
20.0000 mL | Freq: Once | INTRAVENOUS | Status: AC | PRN
  Administered 2017-05-01: 20 mL via INTRAVENOUS

## 2017-05-01 MED ORDER — POTASSIUM CHLORIDE CRYS ER 20 MEQ PO TBCR
40.0000 meq | EXTENDED_RELEASE_TABLET | Freq: Once | ORAL | Status: AC
  Administered 2017-05-01: 40 meq via ORAL
  Filled 2017-05-01: qty 2

## 2017-05-01 MED ORDER — HYDROCODONE-ACETAMINOPHEN 5-325 MG PO TABS: 1.0000 | ORAL_TABLET | ORAL | 0 refills | Status: DC | PRN

## 2017-05-01 NOTE — Discharge Instructions (Addendum)
Apply ice to painful area. Take ibuprofen as needed for less severe pain.

## 2017-05-01 NOTE — ED Provider Notes (Addendum)
[facility] EMERGENCY DEPARTMENT Provider Note   CSN: [id number] Arrival date & time: 04/30/17  6283     History   Chief Complaint Chief Complaint  Patient presents with  . Chest Pain    HPI [name] is a 57 y.o. female.  The history is provided by the patient.  Chest Pain    She has history of asthma, hypertension, hyperlipidemia, morbid obesity and is currently being evaluated for possible genitourinary malignancy.  While on the bus going home today, she had sudden onset of a dull pain in her mid back which radiated up toward her neck and around her chest.  Pain was rated 10/10.  It has been waxing and waning since then.  Pain is currently rated at 7/10.  There is no associated dyspnea, nausea, diaphoresis.  Pain seems to be better if she is sitting up.  Nothing seems to make it worse.  She denies any numbness or tingling anywhere.  She is very concerned because she is supposed to have a follow-up MRI scan of her pelvis done in 2days.  She is requesting to have that scan done tonight.  Scan was done to evaluate possible mass adjacent to the vagina.  She had presented with vaginal bleeding and had an abscess on her bladder which required 2 hospitalizations  She has not had any vaginal bleeding.  She does have some urinary urgency and frequency, but no dysuria or tenesmus.  She is status post hysterectomy.  Past Medical History:  Diagnosis Date  . Alcohol abuse    abuse- moderate years ago - states only drinks now on weekends -2 to 8 driinks   . Asthma   . COLONIC POLYPS, HX OF 04/05/2010  . DIVERTICULITIS, HX OF 04/05/2010  . DJD (degenerative joint disease)    right knee, mot to severe  . GERD (gastroesophageal reflux disease)    no meds  . Heart murmur    hx of   . Hyperlipidemia   . Hypertension   . Impaired glucose tolerance 12/06/2013  . Obesity (BMI 30-39.9)   . PALPITATIONS, HX OF 09/14/2007    Patient Active Problem List   Diagnosis Date  Noted  . Sepsis ([place]) 09/12/2016  . Colovesical fistula 09/12/2016  . Hypertension 07/28/2016  . Alcohol abuse 07/28/2016  . Obesity (BMI 30-39.9) 07/28/2016  . Abscess of bladder 07/28/2016  . Colonic diverticulum 07/28/2016  . Acute hypokalemia 07/28/2016  . Hyperlipidemia 07/10/2016  . Mass of left side of neck 07/10/2016  . Head and neck lymphadenopathy 07/10/2016  . Acute sinus infection 01/25/2016  . Eustachian tube disorder 01/25/2016  . Asthma exacerbation 05/18/2015  . Peripheral edema 03/16/2015  . Asthma 03/16/2015  . Right shoulder pain 03/16/2015  . Hypokalemia 10/04/2014  . Upper airway cough syndrome 10/03/2014  . Severe obesity (BMI >= 40) ([place]) 10/03/2014  . Lower back pain 05/25/2014  . Bilateral shoulder pain 05/25/2014  . Chest pain 04/14/2014  . Allergic rhinitis 12/06/2013  . Impaired glucose tolerance 12/06/2013  . Expected blood loss anemia 12/16/2012  . Morbid obesity ([place]) 12/15/2012  . S/P left TKA 12/14/2012  . Goiter 11/26/2012  . Preop exam for internal medicine 11/26/2012  . Diarrhea 02/25/2012  . Vaginal bleeding 04/30/2011  . Colon polyps 02/10/2011  . Eczema 02/10/2011  . Depression 02/10/2011  . Preventative health care 09/27/2010  . Anxiety state 04/05/2010  . DIVERTICULITIS, HX OF 04/05/2010  . PALPITATIONS, HX OF 09/14/2007  . Obesity 04/24/2007  .  VOCAL CORD DISORDER 04/24/2007  . Extrinsic asthma 04/24/2007  . GERD 04/24/2007    Past Surgical History:  Procedure Laterality Date  . ABDOMINAL HYSTERECTOMY  age 51   fibroids  . COLONOSCOPY WITH PROPOFOL N/A 07/25/2016   Procedure: COLONOSCOPY WITH PROPOFOL;  Surgeon: Carol Ada, MD;  [facility]: [facility];  Service: Endoscopy;  Laterality: N/A;  . colonscopy     x 2  . IR RADIOLOGIST EVAL & MGMT  08/12/2016  . IR RADIOLOGIST EVAL & MGMT  08/21/2016  . KNEE ARTHROSCOPY     left   . TOTAL KNEE ARTHROPLASTY  07/29/2011   Procedure: TOTAL KNEE ARTHROPLASTY;  Surgeon: Mauri Pole, MD;  Location: [facility];  Service: Orthopedics;  Laterality: Right;  . TOTAL KNEE ARTHROPLASTY Left 12/14/2012   Procedure: LEFT TOTAL KNEE ARTHROPLASTY;  Surgeon: Mauri Pole, MD;  Location: [facility];  Service: Orthopedics;  Laterality: Left;    OB History    Gravida  1   Para  1   Term      Preterm      AB      Living  1     SAB      TAB      Ectopic      Multiple      Live Births               Home Medications    Prior to Admission medications   Medication Sig Start Date End Date Taking? Authorizing Provider  albuterol (PROVENTIL HFA;VENTOLIN HFA) 108 (90 Base) MCG/ACT inhaler Inhale 1-2 puffs into the lungs every 6 (six) hours as needed for wheezing. 01/06/17   Biagio Borg, MD  atorvastatin (LIPITOR) 10 MG tablet TAKE 1 TABLET BY MOUTH EVERY DAY Patient taking differently: Take 10 mg by mouth once a day 01/05/17   Biagio Borg, MD  butalbital-acetaminophen-caffeine (FIORICET, ESGIC) [id number] MG tablet Take 1-2 tablets by mouth every 8 (eight) hours as needed for headache. 04/14/17 04/14/18  Antonietta Breach, PA-C  cyclobenzaprine (FLEXERIL) 5 MG tablet Take 1 tablet (5 mg total) by mouth 3 (three) times daily as needed for muscle spasms. Patient not taking: Reported on 04/14/2017 02/05/17   Biagio Borg, MD  Fluticasone-Salmeterol (ADVAIR DISKUS) 250-50 MCG/DOSE AEPB Inhale 1 puff 2 (two) times daily into the lungs. 12/17/16   Biagio Borg, MD  ibuprofen (ADVIL,MOTRIN) 200 MG tablet Take 200 mg by mouth every 6 (six) hours as needed (for pain or headaches).    [provider]  ibuprofen (ADVIL,MOTRIN) 600 MG tablet TAKE 1 TABLET BY MOUTH EVERY 6 HOURS AS NEEDED Patient not taking: Reported on 04/14/2017 03/27/17   Biagio Borg, MD  losartan-hydrochlorothiazide (HYZAAR) 100-25 MG tablet Take 1 tablet by mouth daily. 01/30/17   Biagio Borg, MD  potassium chloride (K-DUR) 10 MEQ tablet Take 1 tablet (10 mEq total) by mouth daily. Patient not taking:  Reported on 04/14/2017 11/15/16   Biagio Borg, MD    Family History Family History  Problem Relation Age of Onset  . Stroke Mother   . COPD Father   . Lymphoma Sister     Social History Social History   Tobacco Use  . Smoking status: Former Smoker    Packs/day: 0.50    Years: 7.00    Pack years: 3.50    Types: Cigarettes    Last attempt to quit: 02/11/1988    Years since quitting: 29.2  . Smokeless tobacco: Never  Used  Substance Use Topics  . Alcohol use: Yes    Comment: Occas  . Drug use: No    Comment: smoked marajuania x 10 years.  Quit in 1985.     Allergies   Morphine; Shrimp [shellfish allergy]; Tramadol; and Latex   Review of Systems Review of Systems  Cardiovascular: Positive for chest pain.  All other systems reviewed and are negative.    Physical Exam Updated Vital Signs BP (!) 141/84 (BP Location: Left Arm)   Pulse 92   Temp 98.6 F (37 C) (Oral)   Resp 16   SpO2 98%   Physical Exam  Nursing note and vitals reviewed.  Morbidly obese 57 year old female, resting comfortably and in no acute distress. Vital signs are significant for borderline elevated systolic blood pressure. Oxygen saturation is 98%, which is normal. Head is normocephalic and atraumatic. PERRLA, EOMI. Oropharynx is clear. Neck is nontender and supple without adenopathy or JVD. Back is mildly tender at the thoracolumbar junction.  There is no CVA tenderness. Lungs are clear without rales, wheezes, or rhonchi. Chest is nontender. Heart has regular rate and rhythm without murmur. Abdomen is soft, flat, nontender without masses or hepatosplenomegaly and peristalsis is normoactive. Extremities have 1+ edema, full range of motion is present. Skin is warm and dry without rash. Neurologic: Mental status is normal, cranial nerves are intact, there are no motor or sensory deficits.  ED Treatments / Results  Labs (all labs ordered are listed, but only abnormal results are  displayed) Labs Reviewed  BASIC METABOLIC PANEL - Abnormal; Notable for the following components:      Result Value   Potassium 3.3 (*)    BUN 23 (*)    All other components within normal limits  CBC - Abnormal; Notable for the following components:   WBC 12.7 (*)    All other components within normal limits  I-STAT BETA HCG BLOOD, ED ([facility], [facility], [facility] ONLY) - Abnormal; Notable for the following components:   I-stat hCG, quantitative 6.9 (*)    All other components within normal limits  URINALYSIS, ROUTINE W REFLEX MICROSCOPIC  I-STAT TROPONIN, ED    EKG Normal sinus rhythm 87 bpm with occasional PVCs.  Normal axis.  Normal intervals.  Nonspecific ST and T wave changes.  When compared with ECG of April 18, 2016, PVCs are now present, QT interval has shortened, nonspecific ST changes are less prominent.  Radiology Dg Chest 2 View  Result Date: 04/30/2017 CLINICAL DATA:  Chest pain EXAM: CHEST - 2 VIEW COMPARISON:  04/18/2016 FINDINGS: No focal opacity or pleural effusion. Borderline to mild cardiomegaly with central congestion. Aortic atherosclerosis. No pneumothorax. IMPRESSION: Borderline to mild cardiomegaly with central vascular congestion. Electronically Signed   By: Donavan Foil M.D.   On: 04/30/2017 20:01   Mr Pelvis W Wo Contrast  Addendum Date: 05/01/2017   ADDENDUM REPORT: 05/01/2017 16:51 ADDENDUM: Addendum for size comparison. Left adnexal cystic lesion currently measures 1.7 x 2.8 x 2.3 cm. This represents an increase in size from the prior MR, when it measured 0.8 x 2.1 x 1.3 cm. Electronically Signed   By: Julian Hy M.D.   On: 05/01/2017 16:51   Result Date: 05/01/2017 CLINICAL DATA:  Abnormal vaginal bleeding, follow-up left adnexal mass on MRI EXAM: MRI PELVIS WITHOUT AND WITH CONTRAST TECHNIQUE: Multiplanar multisequence MR imaging of the pelvis was performed both before and after administration of intravenous contrast. CONTRAST:  48mL MULTIHANCE GADOBENATE DIMEGLUMINE  529 MG/ML IV SOLN  COMPARISON:  MR pelvis dated 04/08/2017. CT abdomen/pelvis dated 03/13/2016, 09/12/2016, 08/12/2016, and 07/28/2016. FINDINGS: Urinary Tract:  Bladder is within normal limits. Bowel:  Visualized bowel is grossly unremarkable. Mild stranding is present adjacent to the sigmoid colon in the left lower pelvis (series 7001/image 14), but without acute inflammatory changes or definite fistulous communication. Vascular/Lymphatic: No evidence of aneurysm. No suspicious pelvic lymphadenopathy. Reproductive:  Status post hysterectomy. Right ovary measures 1.3 x 2.8 cm and is within normal limits (series 7001/image 16). Left ovary measures 1.4 x 2.8 cm and is within normal limits (series 7001/image 15). Other: 1.7 x 2.8 x 2.3 cm complex cystic lesion in the left pelvis adjacent to the left ovary/adnexa (series 7001/image 20). Mild rim enhancement following contrast administration. When correlating with multiple prior CTs, this likely reflects sequela of prior diverticular abscess. However, this does not appear to definitely involve the left ovary on the current MRI. Small volume right pelvic ascites, simple. Musculoskeletal: Mild degenerative changes at L5-S1. Sacral Tarlov cyst. IMPRESSION: 1.7 x 2.8 x 2.3 cm complex cystic lesion in the left pelvis, likely sequela of prior diverticular abscess. This does not definitely involve the left ovary on the current MRI. Small volume right pelvic ascites, simple. Mild stranding adjacent to the sigmoid colon in the left lower pelvis, without MR findings to suggest acute diverticulitis or fistulous communication. Electronically Signed: By: Julian Hy M.D. On: 05/01/2017 08:14    Procedures Procedures  Medications Ordered in ED Medications  potassium chloride SA (K-DUR,KLOR-CON) CR tablet 40 mEq (has no administration in time range)  HYDROcodone-acetaminophen (NORCO/VICODIN) 5-325 MG per tablet 1 tablet (1 tablet Oral Given 04/30/17 2140)   HYDROcodone-acetaminophen (NORCO/VICODIN) 5-325 MG per tablet 1 tablet (1 tablet Oral Given 05/01/17 0155)  gadobenate dimeglumine (MULTIHANCE) injection 20 mL (20 mLs Intravenous Contrast Given 05/01/17 0452)  HYDROcodone-acetaminophen (NORCO/VICODIN) 5-325 MG per tablet 1 tablet (1 tablet Oral Given 05/01/17 0543)     Initial Impression / Assessment and Plan / ED Course  I have reviewed the triage vital signs and the nursing notes.  Pertinent labs & imaging results that were available during my care of the patient were reviewed by me and considered in my medical decision making (see chart for details).  Back pain which appears to be musculoskeletal.  Heart score is 2, which puts her at low risk for major adverse cardiac events in the next 30 days.  Old records are reviewed confirming her ongoing evaluation of mass by the vagina and bladder.  Recent MRI was unable to distinguish between inflammatory changes and possible mass which is reason for repeat scan.  Although possible malignancy would put her at risk for pulmonary embolism, none of her symptoms or findings are suggestive of pulmonary embolism.  Chest x-ray showed mild cardiomegaly.  Initial troponin is normal.  Will get repeat troponin.  ECG shows no acute changes.  Per patient's request, I will send her for her MRI scan tonight.  Repeat troponin is normal.  Urinalysis shows no evidence of urinary tract infection.  She did require additional hydrocodone-acetaminophen for pain control.  MRI preliminary report shows no change in previously seen lesion with full report pending.  She will need to follow-up with her GYN oncologist regarding this.  She is discharged with instructions to use over-the-counter NSAIDs, but also given prescription for small number of hydrocodone-acetaminophen tablets.  Also, mild hypokalemia is noted.  She is on a diuretic, so likely will need ongoing potassium supplementation.  She is given a prescription  for K Dur.   Return precautions discussed.  Final Clinical Impressions(s) / ED Diagnoses   Final diagnoses:  Mid back pain  Diuretic-induced hypokalemia    ED Discharge Orders        Ordered    potassium chloride SA (K-DUR,KLOR-CON) 20 MEQ tablet  Daily     05/01/17 0646    HYDROcodone-acetaminophen (NORCO) 5-325 MG tablet  Every 4 hours PRN     37/90/24 0973       Delora Fuel, MD 53/29/92 4268    Delora Fuel, MD 34/19/62 2258

## 2017-05-01 NOTE — ED Notes (Signed)
ED Provider at bedside. 

## 2017-05-03 ENCOUNTER — Inpatient Hospital Stay: Admission: RE | Admit: 2017-05-03 | Payer: BLUE CROSS/BLUE SHIELD | Source: Ambulatory Visit

## 2017-05-04 ENCOUNTER — Ambulatory Visit: Payer: BLUE CROSS/BLUE SHIELD | Admitting: Gynecology

## 2017-05-04 ENCOUNTER — Encounter: Payer: Self-pay | Admitting: Gynecology

## 2017-05-04 ENCOUNTER — Inpatient Hospital Stay: Payer: BLUE CROSS/BLUE SHIELD | Attending: Gynecology | Admitting: Gynecology

## 2017-05-04 VITALS — BP 160/94 | HR 100 | Temp 98.3°F | Resp 22 | Wt 296.2 lb

## 2017-05-04 DIAGNOSIS — Z90710 Acquired absence of both cervix and uterus: Secondary | ICD-10-CM | POA: Insufficient documentation

## 2017-05-04 DIAGNOSIS — Z87891 Personal history of nicotine dependence: Secondary | ICD-10-CM | POA: Diagnosis not present

## 2017-05-04 DIAGNOSIS — R1907 Generalized intra-abdominal and pelvic swelling, mass and lump: Secondary | ICD-10-CM

## 2017-05-04 DIAGNOSIS — R1900 Intra-abdominal and pelvic swelling, mass and lump, unspecified site: Secondary | ICD-10-CM | POA: Insufficient documentation

## 2017-05-04 DIAGNOSIS — N939 Abnormal uterine and vaginal bleeding, unspecified: Secondary | ICD-10-CM | POA: Insufficient documentation

## 2017-05-04 NOTE — Patient Instructions (Signed)
Plan on having another MRI of the pelvis and follow up with Dr. Fermin Schwab on April 26.  Please call for any new vaginal bleeding symptoms.  Please call for any questions or concerns.

## 2017-05-04 NOTE — Progress Notes (Signed)
Consult Note: Gyn-Onc   [name] 57 y.o. female  Chief Complaint  Patient presents with  . Pelvic mass in female    Assessment : Cystic structure adjacent to the vaginal apex measuring 1.7 x 2.8 x 2.3 cm.  This is relatively stable over the past month.  Based on radiologic findings it is felt this is associated with her prior diverticular abscess.  Plan: The pros and cons of aspiration of the cyst transvaginally versus continued serial follow-up with MRI were discussed.  The patient would prefer to have the mass followed with MRI especially given that she has had no further bleeding..  We will schedule a repeat MRI in approximately 4-6 weeks.   Interval history: Following my initial visit with the patient we obtained a ultrasound of the vaginal cuff demonstrating a 3.0 x 1.4 x 1.9 cm thick-walled cystic lesion with sequelae from diverticular abscess.  Subsequently we obtained an MRI on May 01, 2017 confirming that the cyst remained essentially stable.  In addition there appears to be a normal left and right ovary.  Patient reports that she has had no further bleeding (she last had bleeding April 17, 2017) the patient is quite anxious and has called our office several times a day over the past several weeks.  Initially it seemed reasonable to consider fine-needle aspiration although interventional radiology did not wish to undertake this procedure.  Further it seemed that we might consider doing this in the operating room under anesthesia with sterile vaginal preparation.  However, the patient really prefers not to undertake the procedure.  HPI: 57 year old who is seen in consultation at the request of Dr. Phineas Real regarding vaginal bleeding.  Patient reports that the vaginal bleeding started approximately 4 years ago.  It is very intermittent sometimes with just 1 day of spotting but on occasion some heavy clots.  It is not associated with sexual intercourse.  She has been evaluated by Dr.  Phineas Real on several visits and on the most recent visit blood in the vagina was seen although no source of bleeding was noted.  She has had a Pap smear that was normal.  Patient has a past history of undergoing hysterectomy for fibroids.  Her ovaries remain in place.  In August 2018 the patient was admitted with sepsis associated with a Colo- vesicle fistula.  A drain was placed and the patient was treated with antibiotics.  A most recent CT scan (March 13, 2017) apparently shows resolution of the fistula.  It is noted that at the site of the prior fistula between the sigmoid colon and the urinary bladder there is a bandlike density which "probably represents residual fistula".  It is thought that this might also be scarring given the lack of internal gas.  Currently the patient denies any pelvic pain and has no fevers.  Review of Systems:10 point review of systems is negative except as noted in interval history.   Vitals: Blood pressure (!) 160/94, pulse 100, temperature 98.3 F (36.8 C), temperature source Oral, resp. rate (!) 22, weight 296 lb 3.2 oz (134.4 kg), SpO2 99 %.  Physical Exam: General : The patient is a healthy woman in no acute distress.  HEENT: normocephalic, extraoccular movements normal; neck is supple without thyromegally  Lynphnodes: Supraclavicular and inguinal nodes not enlarged    Lower extremities: No edema or varicosities. Normal range of motion     Allergies  Allergen Reactions  . Morphine Itching  . Shrimp [Shellfish Allergy] Itching and Other (See  Comments)    Tongue burns also  . Tramadol Other (See Comments)    Caused confusion  . Latex Rash    Past Medical History:  Diagnosis Date  . Alcohol abuse    abuse- moderate years ago - states only drinks now on weekends -2 to 8 driinks   . Asthma   . COLONIC POLYPS, HX OF 04/05/2010  . DIVERTICULITIS, HX OF 04/05/2010  . DJD (degenerative joint disease)    right knee, mot to severe  . GERD  (gastroesophageal reflux disease)    no meds  . Heart murmur    hx of   . Hyperlipidemia   . Hypertension   . Impaired glucose tolerance 12/06/2013  . Obesity (BMI 30-39.9)   . PALPITATIONS, HX OF 09/14/2007    Past Surgical History:  Procedure Laterality Date  . ABDOMINAL HYSTERECTOMY  age 27   fibroids  . COLONOSCOPY WITH PROPOFOL N/A 07/25/2016   Procedure: COLONOSCOPY WITH PROPOFOL;  Surgeon: Carol Ada, MD;  [facility];  Service: Endoscopy;  Laterality: N/A;  . colonscopy     x 2  . IR RADIOLOGIST EVAL & MGMT  08/12/2016  . IR RADIOLOGIST EVAL & MGMT  08/21/2016  . KNEE ARTHROSCOPY     left   . TOTAL KNEE ARTHROPLASTY  07/29/2011   Procedure: TOTAL KNEE ARTHROPLASTY;  Surgeon: Mauri Pole, MD;  Location: [facility];  Service: Orthopedics;  Laterality: Right;  . TOTAL KNEE ARTHROPLASTY Left 12/14/2012   Procedure: LEFT TOTAL KNEE ARTHROPLASTY;  Surgeon: Mauri Pole, MD;  Location: [facility];  Service: Orthopedics;  Laterality: Left;    Current Outpatient Medications  Medication Sig Dispense Refill  . albuterol (PROVENTIL HFA;VENTOLIN HFA) 108 (90 Base) MCG/ACT inhaler Inhale 1-2 puffs into the lungs every 6 (six) hours as needed for wheezing. 1 Inhaler 5  . atorvastatin (LIPITOR) 10 MG tablet TAKE 1 TABLET BY MOUTH EVERY DAY 90 tablet 1  . Fluticasone-Salmeterol (ADVAIR DISKUS) 250-50 MCG/DOSE AEPB Inhale 1 puff 2 (two) times daily into the lungs. 3 each 2  . ibuprofen (ADVIL,MOTRIN) 600 MG tablet Take 600 mg by mouth every 6 (six) hours as needed.  0  . losartan-hydrochlorothiazide (HYZAAR) 100-25 MG tablet Take 1 tablet by mouth daily. 90 tablet 3  . potassium chloride SA (K-DUR,KLOR-CON) 20 MEQ tablet Take 1 tablet (20 mEq total) by mouth daily. 30 tablet 0   No current facility-administered medications for this visit.     Social History   Socioeconomic History  . Marital status: Married    Spouse name: Not on file  . Number of children: 1  . Years of  education: Not on file  . Highest education level: Not on file  Occupational History  . Occupation: Scientist, forensic: [employer]  . Financial resource strain: Not on file  . Food insecurity:    Worry: Not on file    Inability: Not on file  . Transportation needs:    Medical: Not on file    Non-medical: Not on file  Tobacco Use  . Smoking status: Former Smoker    Packs/day: 0.50    Years: 7.00    Pack years: 3.50    Types: Cigarettes    Last attempt to quit: 02/11/1988    Years since quitting: 29.2  . Smokeless tobacco: Never Used  Substance and Sexual Activity  . Alcohol use: Yes    Comment: Occas  . Drug use: No    Comment:  smoked marajuania x 10 years.  Quit in 1985.  Marland Kitchen Sexual activity: Yes    Birth control/protection: Surgical    Comment: 1st intercourse 57 yo-Fewer than 5 partners  Lifestyle  . Physical activity:    Days per week: Not on file    Minutes per session: Not on file  . Stress: Not on file  Relationships  . Social connections:    Talks on phone: Not on file    Gets together: Not on file    Attends religious service: Not on file    Active member of club or organization: Not on file    Attends meetings of clubs or organizations: Not on file    Relationship status: Not on file  . Intimate partner violence:    Fear of current or ex partner: Not on file    Emotionally abused: Not on file    Physically abused: Not on file    Forced sexual activity: Not on file  Other Topics Concern  . Not on file  Social History Narrative   Environmental consultant on file.  [name] April 05, 2010 12:04 PM    Family History  Problem Relation Age of Onset  . Stroke Mother   . COPD Father   . Lymphoma Sister       Marti Sleigh, MD 05/04/2017, 4:23 PM

## 2017-05-08 ENCOUNTER — Other Ambulatory Visit: Payer: BLUE CROSS/BLUE SHIELD

## 2017-05-08 SURGERY — Surgical Case
Anesthesia: *Unknown

## 2017-05-14 ENCOUNTER — Telehealth: Payer: Self-pay | Admitting: *Deleted

## 2017-05-14 NOTE — Telephone Encounter (Signed)
Called and left the patient a message to call the office  

## 2017-05-18 ENCOUNTER — Telehealth: Payer: Self-pay | Admitting: Internal Medicine

## 2017-05-18 NOTE — Telephone Encounter (Signed)
Called pt, LVM  No record of patient assistance forms in media tab. Does she have a fax number?

## 2017-05-18 NOTE — Telephone Encounter (Signed)
Copied from [place]. Topic: Quick Communication - Rx Refill/Question >> May 18, 2017  9:44 AM Scherrie Gerlach wrote: Medication: Fluticasone-Salmeterol (ADVAIR DISKUS) 250-50 MCG/DOSE AEPB  Pt states this Rx is sent to [facility],  due to her assistance program. Pt needs Rx sent to them, and not to local pharmacy  Pt states she can be reached at work if you have any questions

## 2017-05-18 NOTE — Telephone Encounter (Signed)
Pt will callback tomorrow with the fax number

## 2017-05-19 ENCOUNTER — Telehealth: Payer: Self-pay | Admitting: *Deleted

## 2017-05-19 NOTE — Telephone Encounter (Signed)
Called and left the patient a message to call the office back. Left a message that her MRI and appt with Dr. Fermin Schwab is scheduled

## 2017-05-25 ENCOUNTER — Telehealth: Payer: Self-pay | Admitting: Internal Medicine

## 2017-05-25 ENCOUNTER — Telehealth: Payer: Self-pay | Admitting: *Deleted

## 2017-05-25 ENCOUNTER — Telehealth: Payer: Self-pay

## 2017-05-25 MED ORDER — FLUTICASONE-SALMETEROL 250-50 MCG/DOSE IN AEPB: 1.0000 | INHALATION_SPRAY | Freq: Two times a day (BID) | RESPIRATORY_TRACT | 5 refills | Status: DC

## 2017-05-25 NOTE — Telephone Encounter (Signed)
Pt also wanted to note her cancer doctor sent her to [facility] imaging, and she would like her pcp to look at those results if he could; she would like to know if he recommends her to do blood work, call pt to advise  See note below about pt's Rx   Copied from [place]. Topic: Quick Communication - Rx Refill/Question >> May 18, 2017  9:44 AM Scherrie Gerlach wrote: Medication: Fluticasone-Salmeterol (ADVAIR DISKUS) 250-50 MCG/DOSE AEPB  Pt states this Rx is sent to [place],  due to her assistance program. Pt needs Rx sent to them, and not to local pharmacy  Pt states she can be reached at work if you have any questions >> May 19, 2017  9:49 AM Aurelio Brash B wrote: Pt called back with the fax number needed for [employer]air diskus [phone]   PT ID number [id number].    PT would also like a call back when it is faxed

## 2017-05-25 NOTE — Telephone Encounter (Signed)
Called pt, LVM to call back to discuss. 

## 2017-05-25 NOTE — Telephone Encounter (Signed)
Most recent MRI pelvis reviewed and I agree it is consistent with enlarging mass in the pelvis  Please make sure to f/u with Dr Fermin Schwab as you planned   OK for refill - done erx

## 2017-05-25 NOTE — Telephone Encounter (Signed)
Pt left VM in regards to having some beige spotting ?pus after urinating, when she wipes.  Reports it as comes and goes, happened 2 weeks ago and then again this am.  She wondered if she needed any antibiotics?  Attempted to call pt back, reached her VM and left her a message - included that she has an upcoming pelvic MRI this Friday at 3:20 pm and office appt for 4-26 at 3:15 pm and told pt to please call and verify she has these appts.

## 2017-05-25 NOTE — Telephone Encounter (Signed)
Call received from this patient requesting to speak with A.P.P for GYN ONC.  Call transferred to [phone] for help with this matter.

## 2017-05-26 ENCOUNTER — Telehealth: Payer: Self-pay

## 2017-05-26 ENCOUNTER — Telehealth: Payer: Self-pay | Admitting: *Deleted

## 2017-05-26 MED ORDER — FLUTICASONE-SALMETEROL 250-50 MCG/DOSE IN AEPB: 1.0000 | INHALATION_SPRAY | Freq: Two times a day (BID) | RESPIRATORY_TRACT | 3 refills | Status: DC

## 2017-05-26 NOTE — Telephone Encounter (Signed)
Done hardcopy to [place]  

## 2017-05-26 NOTE — Addendum Note (Signed)
Addended by: Matilde Sprang on: 05/26/2017 02:54 PM   Modules accepted: Orders

## 2017-05-26 NOTE — Telephone Encounter (Signed)
Dr. Jenny Reichmann please sign script. I will fax to [place] assistance program.   >> May 19, 2017  9:49 AM Aurelio Brash B wrote: Pt called back with the fax number needed for advair diskus [phone]   PT ID number [id number].    PT would also like a call back when it is faxed  >

## 2017-05-26 NOTE — Telephone Encounter (Signed)
Patient called on all numbers listed, left VM to return call to clarify the pharmacy to send the Advair refill to.

## 2017-05-26 NOTE — Addendum Note (Signed)
Addended by: Biagio Borg on: 05/26/2017 05:07 PM   Modules accepted: Orders

## 2017-05-26 NOTE — Telephone Encounter (Signed)
This is being taken care of in another encounter today by Juliet Rude, [place].

## 2017-05-26 NOTE — Telephone Encounter (Signed)
Patient calling in reference to having some beige spotting, and pus after urinating when she wipes.  Patient wanted to know if she needed antibiotics.  Advised patient per Joylene John, NP that no antibiotic is needed at this time, but please keep the upcoming appointment for pelvic MRI.  Patient acknowledged that she would keep her appointment for the MRI this Friday April 19th at 3:20p.m. I reminded the patient that her follow up appointment with Dr. Fermin Schwab is scheduled for April 26th at 3:15p.m.

## 2017-05-26 NOTE — Telephone Encounter (Signed)
Pt. Has been waiting 3 days for this   For Advair.  [facility] not [facility] to [facility]

## 2017-05-26 NOTE — Telephone Encounter (Signed)
Prescription was to be sent to fax # [phone] [place] [facility] Mai[facility].  Please re-send

## 2017-05-27 NOTE — Telephone Encounter (Signed)
Faxed

## 2017-05-29 ENCOUNTER — Ambulatory Visit
Admission: RE | Admit: 2017-05-29 | Discharge: 2017-05-29 | Disposition: A | Discharge status: Home / Self Care | Payer: BLUE CROSS/BLUE SHIELD | Source: Ambulatory Visit | Attending: Gynecologic Oncology | Admitting: Gynecologic Oncology

## 2017-05-29 DIAGNOSIS — R1900 Intra-abdominal and pelvic swelling, mass and lump, unspecified site: Secondary | ICD-10-CM

## 2017-05-29 DIAGNOSIS — R1907 Generalized intra-abdominal and pelvic swelling, mass and lump: Secondary | ICD-10-CM

## 2017-05-29 MED ORDER — GADOBENATE DIMEGLUMINE 529 MG/ML IV SOLN
20.0000 mL | Freq: Once | INTRAVENOUS | Status: AC | PRN
  Administered 2017-05-29: 20 mL via INTRAVENOUS

## 2017-06-02 ENCOUNTER — Telehealth: Payer: Self-pay | Admitting: *Deleted

## 2017-06-02 NOTE — Telephone Encounter (Signed)
Returned the patient's call and informed her that Dr. Fermin Schwab hasn't read the MRI results yet, once he reads is he will call her or see her on Friday.

## 2017-06-05 ENCOUNTER — Other Ambulatory Visit: Payer: Self-pay | Admitting: Gynecologic Oncology

## 2017-06-05 ENCOUNTER — Ambulatory Visit (INDEPENDENT_AMBULATORY_CARE_PROVIDER_SITE_OTHER): Payer: BLUE CROSS/BLUE SHIELD | Admitting: Podiatry

## 2017-06-05 ENCOUNTER — Telehealth: Payer: Self-pay | Admitting: Gynecology

## 2017-06-05 ENCOUNTER — Inpatient Hospital Stay: Payer: BLUE CROSS/BLUE SHIELD | Admitting: Gynecology

## 2017-06-05 ENCOUNTER — Telehealth: Payer: Self-pay | Admitting: *Deleted

## 2017-06-05 DIAGNOSIS — N939 Abnormal uterine and vaginal bleeding, unspecified: Secondary | ICD-10-CM

## 2017-06-05 DIAGNOSIS — M79609 Pain in unspecified limb: Secondary | ICD-10-CM

## 2017-06-05 DIAGNOSIS — B351 Tinea unguium: Secondary | ICD-10-CM | POA: Diagnosis not present

## 2017-06-05 DIAGNOSIS — I872 Venous insufficiency (chronic) (peripheral): Secondary | ICD-10-CM

## 2017-06-05 NOTE — Telephone Encounter (Signed)
Left message informing pt we referred pt's for compression hose to [facility] [phone]. Faxed orders to [facility].

## 2017-06-05 NOTE — Progress Notes (Signed)
  Subjective:  Patient ID: [name], female    DOB: 06-Sep-1960,  MRN: [id number]  No chief complaint on file.  57 y.o. female returns for the above complaint. Reports continued pain in the nails.  Secondary complaint of swelling in both legs and ankles. States that they cause her pain at work and when she gets home. Has difficulty wearing compression socks due to bilateral knee replacements.  Objective:  There were no vitals filed for this visit. General AA&O x3. Normal mood and affect.  Vascular Pedal pulses palpable. Edema bilat ankles.  Neurologic Epicritic sensation grossly intact.  Dermatologic No open lesions. Skin normal texture and turgor. Toenails x 10 elongated, thickened, dystrophic.  Orthopedic: Pain to palpation about the toenails.   Assessment & Plan:  Patient was evaluated and treated and all questions answered.  Onychomycosis with pain  -Nails palliatively debrided as below. -Educated on self-care  Procedure: Nail Debridement Rationale: pain  Type of Debridement: manual, sharp debridement. Instrumentation: Nail nipper, rotary burr. Number of Nails: 10  Venous Insufficiency -Rx Compression socks -Tubigrip applied today.   F/u PRN.

## 2017-06-05 NOTE — Telephone Encounter (Signed)
Faxed required form to [facility].

## 2017-06-05 NOTE — Telephone Encounter (Signed)
-----   Message from Evelina Bucy, DPM sent at 06/05/2017  8:40 AM EDT ----- Regarding: Compression Socks Can we send an rx for compresison socks or give her info about it? Thanks

## 2017-06-05 NOTE — Telephone Encounter (Signed)
The patient had a recent MRI in follow-up of the sequelae from her colovesical fistula.  The report indicates that the cystic mass at the vaginal apex has decreased in size.  I contacted the patient by phone and shared this information with her.  She tells me that her bleeding has decreased significantly and that she has a thin yellowish discharge.  She denies any fever or chills.  In summary I indicated the patient I believe that her vaginal bleeding was secondary to the fistula and that it was improving both based on MRI findings as well as her clinical symptoms.  We recommended that she have a repeat MRI in approximately 3 months.  All of her questions were answered and she seems happy and satisfied with this report.

## 2017-06-08 ENCOUNTER — Telehealth: Payer: Self-pay | Admitting: *Deleted

## 2017-06-08 NOTE — Telephone Encounter (Signed)
Called and left the patient a message with the information for her appts in July. July 29th app is at [facility] [place] arrive at 4:40pm, then to see Dr. Fermin Schwab on July 30th at 12pm.

## 2017-06-09 ENCOUNTER — Other Ambulatory Visit: Payer: Self-pay

## 2017-06-09 ENCOUNTER — Telehealth: Payer: Self-pay | Admitting: Podiatry

## 2017-06-09 MED ORDER — MELOXICAM 15 MG PO TABS: 15.0000 mg | ORAL_TABLET | Freq: Every day | ORAL | 0 refills | Status: DC

## 2017-06-09 NOTE — Telephone Encounter (Signed)
I saw Dr. March Rummage this past Friday, 26 April. He was supposed to give me a [facility]. They said they have not received it. If you could give me a call back on my home number at [phone]. Thank you.

## 2017-06-24 ENCOUNTER — Telehealth: Payer: Self-pay | Admitting: Internal Medicine

## 2017-06-24 NOTE — Telephone Encounter (Signed)
I would not know how to do this, since it appears she is taking the losartan HCT currently, thanks

## 2017-06-24 NOTE — Telephone Encounter (Signed)
Copied from [place] [id number]. Topic: Quick Communication - See Telephone Encounter >> Jun 24, 2017  2:37 PM Ether Griffins B wrote: CRM for notification. See Telephone encounter for: 06/24/17.  Pt would like to go back to her old BP medication losartan 25 MG. The new medication that she was placed on while she was in the hospital is not working for her. She states it keeps her BP on and off elevated.  [id number] [place] [place] [facility] [address]

## 2017-06-25 NOTE — Telephone Encounter (Signed)
Called pt, LVM with details below to discuss.

## 2017-07-02 ENCOUNTER — Other Ambulatory Visit: Payer: Self-pay | Admitting: Internal Medicine

## 2017-07-08 ENCOUNTER — Other Ambulatory Visit: Payer: Self-pay | Admitting: Podiatry

## 2017-08-03 ENCOUNTER — Other Ambulatory Visit: Payer: Self-pay | Admitting: Internal Medicine

## 2017-08-03 ENCOUNTER — Telehealth: Payer: Self-pay | Admitting: Emergency Medicine

## 2017-08-03 DIAGNOSIS — E785 Hyperlipidemia, unspecified: Secondary | ICD-10-CM

## 2017-08-03 DIAGNOSIS — R739 Hyperglycemia, unspecified: Secondary | ICD-10-CM

## 2017-08-03 NOTE — Telephone Encounter (Signed)
Ok, I have placed orders

## 2017-08-03 NOTE — Telephone Encounter (Signed)
Copied from [place] [phone]. Topic: General - Other >> Aug 03, 2017  2:37 PM Waldemar Dickens, Bary Castilla R wrote: Pt wants to know if an order can blood work be sent in to check her wbc ?  Callback # [phone]

## 2017-08-04 NOTE — Telephone Encounter (Signed)
Called pt, LVM with details below  

## 2017-08-07 ENCOUNTER — Other Ambulatory Visit (INDEPENDENT_AMBULATORY_CARE_PROVIDER_SITE_OTHER): Payer: BLUE CROSS/BLUE SHIELD

## 2017-08-07 DIAGNOSIS — R739 Hyperglycemia, unspecified: Secondary | ICD-10-CM

## 2017-08-07 DIAGNOSIS — E785 Hyperlipidemia, unspecified: Secondary | ICD-10-CM

## 2017-08-07 LAB — CBC WITH DIFFERENTIAL/PLATELET
Basophils Absolute: 0.1 10*3/uL (ref 0.0–0.1)
Basophils Relative: 0.6 % (ref 0.0–3.0)
Eosinophils Absolute: 0.1 10*3/uL (ref 0.0–0.7)
Eosinophils Relative: 0.5 % (ref 0.0–5.0)
HCT: 42.6 % (ref 36.0–46.0)
Hemoglobin: 14 g/dL (ref 12.0–15.0)
Lymphocytes Relative: 8.5 % — ABNORMAL LOW (ref 12.0–46.0)
Lymphs Abs: 1.1 10*3/uL (ref 0.7–4.0)
MCHC: 32.8 g/dL (ref 30.0–36.0)
MCV: 90.8 fl (ref 78.0–100.0)
Monocytes Absolute: 0.6 10*3/uL (ref 0.1–1.0)
Monocytes Relative: 5.1 % (ref 3.0–12.0)
Neutro Abs: 10.5 10*3/uL — ABNORMAL HIGH (ref 1.4–7.7)
Neutrophils Relative %: 85.3 % — ABNORMAL HIGH (ref 43.0–77.0)
Platelets: 232 10*3/uL (ref 150.0–400.0)
RBC: 4.69 Mil/uL (ref 3.87–5.11)
RDW: 14.3 % (ref 11.5–15.5)
WBC: 12.3 10*3/uL — ABNORMAL HIGH (ref 4.0–10.5)

## 2017-08-07 LAB — HEPATIC FUNCTION PANEL
ALT: 30 U/L (ref 0–35)
AST: 28 U/L (ref 0–37)
Albumin: 3.9 g/dL (ref 3.5–5.2)
Alkaline Phosphatase: 98 U/L (ref 39–117)
Bilirubin, Direct: 0.1 mg/dL (ref 0.0–0.3)
Total Bilirubin: 0.4 mg/dL (ref 0.2–1.2)
Total Protein: 7.3 g/dL (ref 6.0–8.3)

## 2017-08-07 LAB — LIPID PANEL
Cholesterol: 177 mg/dL (ref 0–200)
HDL: 63.4 mg/dL (ref >39.00)
LDL Cholesterol: 104 mg/dL — ABNORMAL HIGH (ref 0–99)
NonHDL: 113.62
Total CHOL/HDL Ratio: 3
Triglycerides: 47 mg/dL (ref 0.0–149.0)
VLDL: 9.4 mg/dL (ref 0.0–40.0)

## 2017-08-07 LAB — BASIC METABOLIC PANEL
BUN: 14 mg/dL (ref 6–23)
CO2: 27 mEq/L (ref 19–32)
Calcium: 9.3 mg/dL (ref 8.4–10.5)
Chloride: 105 mEq/L (ref 96–112)
Creatinine, Ser: 0.8 mg/dL (ref 0.40–1.20)
GFR: 95.3 mL/min (ref >60.00)
Glucose, Bld: 115 mg/dL — ABNORMAL HIGH (ref 70–99)
Potassium: 3.8 mEq/L (ref 3.5–5.1)
Sodium: 139 mEq/L (ref 135–145)

## 2017-08-07 LAB — HEMOGLOBIN A1C: Hgb A1c MFr Bld: 5.9 % (ref 4.6–6.5)

## 2017-08-10 ENCOUNTER — Telehealth: Payer: Self-pay | Admitting: Internal Medicine

## 2017-08-10 NOTE — Telephone Encounter (Signed)
PCP has not review labs yet. Will reach out to patient once I have a result note from PCP.

## 2017-08-10 NOTE — Telephone Encounter (Signed)
Copied from [place] [id number]. Topic: Quick Communication - See Telephone Encounter >> Aug 10, 2017  9:42 AM Mylinda Latina, NT wrote: CRM for notification. See Telephone encounter for: 08/10/17.  Patient called and is inquiring about her lab results mainly her WBC and  Lipid, A1C CB# [id number].

## 2017-08-12 NOTE — Telephone Encounter (Signed)
Patient calling again, requesting call back  °

## 2017-08-12 NOTE — Telephone Encounter (Signed)
Pt would like a call back with her WBC number. Also would like to see if her results can be released to her Mychart.

## 2017-08-12 NOTE — Telephone Encounter (Signed)
Pt has been informed and expressed understanding.  

## 2017-08-12 NOTE — Telephone Encounter (Signed)
Not sure how I missed sending her a letter  All tests ok and stable  Ok to send results in mail if she wants

## 2017-08-14 ENCOUNTER — Ambulatory Visit: Payer: BLUE CROSS/BLUE SHIELD | Admitting: Internal Medicine

## 2017-08-14 ENCOUNTER — Telehealth: Payer: Self-pay | Admitting: *Deleted

## 2017-08-14 NOTE — Telephone Encounter (Signed)
Called and left the patient a message to call the office back. Need to move the patient's appt from July 30th, Dr. Fermin Schwab is going to be out of the office

## 2017-08-20 ENCOUNTER — Other Ambulatory Visit: Payer: Self-pay | Admitting: Podiatry

## 2017-08-20 ENCOUNTER — Telehealth: Payer: Self-pay | Admitting: *Deleted

## 2017-08-20 NOTE — Telephone Encounter (Signed)
Called and left message for the patient to call the office back to reschedule her 7/30 appt

## 2017-08-21 ENCOUNTER — Ambulatory Visit: Payer: BLUE CROSS/BLUE SHIELD | Admitting: Internal Medicine

## 2017-08-21 ENCOUNTER — Encounter (HOSPITAL_COMMUNITY): Payer: Self-pay

## 2017-08-21 ENCOUNTER — Telehealth: Payer: Self-pay | Admitting: *Deleted

## 2017-08-21 ENCOUNTER — Encounter: Payer: Self-pay | Admitting: *Deleted

## 2017-08-21 ENCOUNTER — Ambulatory Visit (HOSPITAL_COMMUNITY)
Admission: EM | Admit: 2017-08-21 | Discharge: 2017-08-21 | Disposition: A | Discharge status: Home / Self Care | Payer: BLUE CROSS/BLUE SHIELD | Attending: Internal Medicine | Admitting: Internal Medicine

## 2017-08-21 DIAGNOSIS — R51 Headache: Secondary | ICD-10-CM | POA: Diagnosis not present

## 2017-08-21 DIAGNOSIS — R519 Headache, unspecified: Secondary | ICD-10-CM

## 2017-08-21 MED ORDER — PREDNISONE 50 MG PO TABS: 50.0000 mg | ORAL_TABLET | Freq: Every day | ORAL | 0 refills | Status: DC

## 2017-08-21 MED ORDER — TRIAMCINOLONE ACETONIDE 55 MCG/ACT NA AERO: 2.0000 | INHALATION_SPRAY | Freq: Every day | NASAL | 0 refills | Status: DC

## 2017-08-21 NOTE — Telephone Encounter (Signed)
Called and left the patient to call the office back to reschedule appt. Also mailed letter

## 2017-08-21 NOTE — ED Provider Notes (Signed)
[facility]    CSN: [id number] Arrival date & time: 08/21/17  1402     History   Chief Complaint Chief Complaint  Patient presents with  . Headache    HPI [name] is a 57 y.o. female.   She presents with intermittent headache, about 4/10, particularly in the last week, but has been ongoing.  Currently it is mid frontal, but sometimes it is located in her temples.  She went to the emergency room in March, and received Fioricet, which disagreed with her.  She would like to avoid an injection.  She has some sinus congestion, and wonders if this is related to the headache.  Last took ibuprofen this morning, usually takes ibuprofen for her headaches but it doesn't really help.  She was able to walk into the urgent care independently, and climb onto the exam table.    HPI  Past Medical History:  Diagnosis Date  . Alcohol abuse    abuse- moderate years ago - states only drinks now on weekends -2 to 8 driinks   . Asthma   . COLONIC POLYPS, HX OF 04/05/2010  . DIVERTICULITIS, HX OF 04/05/2010  . DJD (degenerative joint disease)    right knee, mot to severe  . GERD (gastroesophageal reflux disease)    no meds  . Heart murmur    hx of   . Hyperlipidemia   . Hypertension   . Impaired glucose tolerance 12/06/2013  . Obesity (BMI 30-39.9)   . PALPITATIONS, HX OF 09/14/2007    Patient Active Problem List   Diagnosis Date Noted  . Sepsis ([place]) 09/12/2016  . Colovesical fistula 09/12/2016  . Hypertension 07/28/2016  . Alcohol abuse 07/28/2016  . Obesity (BMI 30-39.9) 07/28/2016  . Abscess of bladder 07/28/2016  . Colonic diverticulum 07/28/2016  . Acute hypokalemia 07/28/2016  . Hyperlipidemia 07/10/2016  . Mass of left side of neck 07/10/2016  . Head and neck lymphadenopathy 07/10/2016  . Acute sinus infection 01/25/2016  . Eustachian tube disorder 01/25/2016  . Asthma exacerbation 05/18/2015  . Peripheral edema 03/16/2015  . Asthma 03/16/2015  . Right  shoulder pain 03/16/2015  . Hypokalemia 10/04/2014  . Upper airway cough syndrome 10/03/2014  . Severe obesity (BMI >= 40) ([place]) 10/03/2014  . Lower back pain 05/25/2014  . Bilateral shoulder pain 05/25/2014  . Chest pain 04/14/2014  . Allergic rhinitis 12/06/2013  . Impaired glucose tolerance 12/06/2013  . Expected blood loss anemia 12/16/2012  . Morbid obesity ([place]) 12/15/2012  . S/P left TKA 12/14/2012  . Goiter 11/26/2012  . Preop exam for internal medicine 11/26/2012  . Diarrhea 02/25/2012  . Vaginal bleeding 04/30/2011  . Colon polyps 02/10/2011  . Eczema 02/10/2011  . Depression 02/10/2011  . Preventative health care 09/27/2010  . Anxiety state 04/05/2010  . DIVERTICULITIS, HX OF 04/05/2010  . PALPITATIONS, HX OF 09/14/2007  . Obesity 04/24/2007  . VOCAL CORD DISORDER 04/24/2007  . Extrinsic asthma 04/24/2007  . GERD 04/24/2007    Past Surgical History:  Procedure Laterality Date  . ABDOMINAL HYSTERECTOMY  age 59   fibroids  . COLONOSCOPY WITH PROPOFOL N/A 07/25/2016   Procedure: COLONOSCOPY WITH PROPOFOL;  Surgeon: Carol Ada, MD;  Location: [facility];  Service: Endoscopy;  Laterality: N/A;  . colonscopy     x 2  . IR RADIOLOGIST EVAL & MGMT  08/12/2016  . IR RADIOLOGIST EVAL & MGMT  08/21/2016  . KNEE ARTHROSCOPY     left   . TOTAL KNEE  ARTHROPLASTY  07/29/2011   Procedure: TOTAL KNEE ARTHROPLASTY;  Surgeon: Mauri Pole, MD;  Location: [facility]S;  Service: Orthopedics;  Laterality: Right;  . TOTAL KNEE ARTHROPLASTY Left 12/14/2012   Procedure: LEFT TOTAL KNEE ARTHROPLASTY;  Surgeon: Mauri Pole, MD;  Location: [facility];  Service: Orthopedics;  Laterality: Left;    OB History    Gravida  1   Para  1   Term      Preterm      AB      Living  1     SAB      TAB      Ectopic      Multiple      Live Births               Home Medications    Prior to Admission medications   Medication Sig Start Date End Date Taking? Authorizing  Provider  atorvastatin (LIPITOR) 10 MG tablet TAKE 1 TABLET BY MOUTH EVERY DAY 08/04/17   Biagio Borg, MD  Fluticasone-Salmeterol (ADVAIR DISKUS) 250-50 MCG/DOSE AEPB Inhale 1 puff into the lungs 2 (two) times daily. 05/26/17   Biagio Borg, MD  ibuprofen (ADVIL,MOTRIN) 600 MG tablet Take 600 mg by mouth every 6 (six) hours as needed. 05/02/17   [provider]  losartan-hydrochlorothiazide (HYZAAR) 100-12.5 MG tablet TAKE 1 TABLET BY MOUTH EVERY DAY 07/02/17   Biagio Borg, MD  losartan-hydrochlorothiazide (HYZAAR) 100-25 MG tablet Take 1 tablet by mouth daily. 01/30/17   Biagio Borg, MD  meloxicam (MOBIC) 15 MG tablet TAKE 1 TABLET BY MOUTH EVERY DAY 07/08/17   Evelina Bucy, DPM  potassium chloride SA (K-DUR,KLOR-CON) 20 MEQ tablet Take 1 tablet (20 mEq total) by mouth daily. 7/90/24   Delora Fuel, MD  predniSONE (DELTASONE) 50 MG tablet Take 1 tablet (50 mg total) by mouth daily. 08/21/17   Wynona Luna, MD  PROAIR HFA 108 [phone] Base) MCG/ACT inhaler INHALE 1 TO 2 PUFFS BY MOUTH EVERY 6 HOURS AS NEEDED FOR WHEEZE 08/04/17   Biagio Borg, MD  triamcinolone (NASACORT) 55 MCG/ACT AERO nasal inhaler Place 2 sprays into the nose daily. 08/21/17   Wynona Luna, MD    Family History Family History  Problem Relation Age of Onset  . Stroke Mother   . COPD Father   . Lymphoma Sister     Social History Social History   Tobacco Use  . Smoking status: Former Smoker    Packs/day: 0.50    Years: 7.00    Pack years: 3.50    Types: Cigarettes    Last attempt to quit: 02/11/1988    Years since quitting: 29.5  . Smokeless tobacco: Never Used  Substance Use Topics  . Alcohol use: Yes    Comment: Occas  . Drug use: No    Comment: smoked marajuania x 10 years.  Quit in 1985.     Allergies   Morphine; Shrimp [shellfish allergy]; Tramadol; and Latex   Review of Systems Review of Systems  All other systems reviewed and are negative.    Physical Exam Triage Vital  Signs ED Triage Vitals  Enc Vitals Group     BP 08/21/17 1410 (!) 166/93     Pulse Rate 08/21/17 1410 (!) 102     Resp 08/21/17 1410 20     Temp 08/21/17 1410 98 F (36.7 C)     Temp Source 08/21/17 1410 Temporal     SpO2 08/21/17 1410  98 %     Weight --      Height --      Pain Score 08/21/17 1411 5     Pain Loc --    Updated Vital Signs BP (!) 153/101   Pulse (!) 103   Temp 98 F (36.7 C) (Temporal)   Resp 20   SpO2 97%  Physical Exam  Constitutional: She is oriented to person, place, and time. No distress.  Alert, nicely groomed  HENT:  Head: Atraumatic.  Bilateral TMs are moderately dull, no erythema Moderate nasal congestion bilaterally  Eyes:  Conjugate gaze, no eye redness/drainage  Neck: Neck supple.  Cardiovascular: Normal rate and regular rhythm.  Irregularly irregular rhythm, with a missed/early beat about every third beat.  Pulmonary/Chest: No respiratory distress. She has no wheezes. She has no rales.  Lungs clear, symmetric breath sounds  Abdominal: She exhibits no distension.  Musculoskeletal: Normal range of motion.  No leg swelling  Neurological: She is alert and oriented to person, place, and time.  Face is symmetric, speech is clear/coherent Patient was able to walk into the urgent care and climb on/off the exam table  Skin: Skin is warm and dry.  No cyanosis  Nursing note and vitals reviewed.    UC Treatments / Results  Labs (all labs ordered are listed, but only abnormal results are displayed) Labs Reviewed - No data to display  EKG None  Radiology No results found.  Procedures Procedures (including critical care time)  Medications Ordered in UC Medications - No data to display   Final Clinical Impressions(s) / UC Diagnoses   Final diagnoses:  Daily headache     Discharge Instructions     Prescription for prednisone (to try to break your cycle of headaches) and a nasal steroid spray (for congestion) were sent to the  pharmacy.  Please followup with your primary care provider to discuss further evaluation and management of your daily headaches.  Ibuprofen does not seem to be helping, and may actually be contributing to the headaches, so would stop taking it.    ED Prescriptions    Medication Sig Dispense Auth. Provider   triamcinolone (NASACORT) 55 MCG/ACT AERO nasal inhaler Place 2 sprays into the nose daily. 1 Inhaler Wynona Luna, MD   predniSONE (DELTASONE) 50 MG tablet Take 1 tablet (50 mg total) by mouth daily. 5 tablet Wynona Luna, MD        Wynona Luna, MD 08/22/17 [phone]

## 2017-08-21 NOTE — ED Triage Notes (Signed)
Pt presents with ongoing headache

## 2017-08-21 NOTE — Discharge Instructions (Addendum)
Prescription for prednisone (to try to break your cycle of headaches) and a nasal steroid spray (for congestion) were sent to the pharmacy.  Please followup with your primary care provider to discuss further evaluation and management of your daily headaches.  Ibuprofen does not seem to be helping, and may actually be contributing to the headaches, so would stop taking it.

## 2017-08-25 ENCOUNTER — Telehealth: Payer: Self-pay | Admitting: *Deleted

## 2017-08-25 NOTE — Telephone Encounter (Signed)
Called and left the patient a message with a possible new appt of 8/13 at 1:15pm

## 2017-08-26 ENCOUNTER — Telehealth: Payer: Self-pay | Admitting: *Deleted

## 2017-08-26 NOTE — Telephone Encounter (Signed)
Patient called and scheduled an appt for 8/13

## 2017-09-07 ENCOUNTER — Other Ambulatory Visit: Payer: BLUE CROSS/BLUE SHIELD

## 2017-09-08 ENCOUNTER — Ambulatory Visit: Payer: BLUE CROSS/BLUE SHIELD | Admitting: Gynecology

## 2017-09-11 ENCOUNTER — Ambulatory Visit
Admission: RE | Admit: 2017-09-11 | Discharge: 2017-09-11 | Disposition: A | Discharge status: Home / Self Care | Payer: BLUE CROSS/BLUE SHIELD | Source: Ambulatory Visit | Attending: Gynecologic Oncology | Admitting: Gynecologic Oncology

## 2017-09-11 DIAGNOSIS — N939 Abnormal uterine and vaginal bleeding, unspecified: Secondary | ICD-10-CM

## 2017-09-11 DIAGNOSIS — N83292 Other ovarian cyst, left side: Secondary | ICD-10-CM | POA: Diagnosis not present

## 2017-09-11 MED ORDER — GADOBENATE DIMEGLUMINE 529 MG/ML IV SOLN
20.0000 mL | Freq: Once | INTRAVENOUS | Status: AC | PRN
  Administered 2017-09-11: 20 mL via INTRAVENOUS

## 2017-09-15 ENCOUNTER — Telehealth: Payer: Self-pay

## 2017-09-15 NOTE — Telephone Encounter (Signed)
Told [name] that the colovaginal fistula unchanged compared to previous study per Joylene John, NP.  [name] stated that the bleeding has decreased over the last three months. She currently is experiencing intermittent spotting. Pt to keep follow up appointment with Dr. Fermin Schwab  On 09-22-17 as scheduled.

## 2017-09-22 ENCOUNTER — Other Ambulatory Visit (INDEPENDENT_AMBULATORY_CARE_PROVIDER_SITE_OTHER): Payer: BLUE CROSS/BLUE SHIELD

## 2017-09-22 ENCOUNTER — Encounter: Payer: Self-pay | Admitting: Gynecology

## 2017-09-22 ENCOUNTER — Encounter: Payer: Self-pay | Admitting: Internal Medicine

## 2017-09-22 ENCOUNTER — Ambulatory Visit (INDEPENDENT_AMBULATORY_CARE_PROVIDER_SITE_OTHER): Payer: BLUE CROSS/BLUE SHIELD | Admitting: Internal Medicine

## 2017-09-22 ENCOUNTER — Inpatient Hospital Stay: Payer: BLUE CROSS/BLUE SHIELD | Attending: Gynecology | Admitting: Gynecology

## 2017-09-22 VITALS — BP 154/100 | HR 94 | Temp 98.0°F | Ht 62.0 in | Wt 297.0 lb

## 2017-09-22 VITALS — BP 160/95 | HR 92 | Temp 98.1°F | Resp 20 | Ht 63.0 in | Wt 296.2 lb

## 2017-09-22 DIAGNOSIS — R3989 Other symptoms and signs involving the genitourinary system: Secondary | ICD-10-CM | POA: Diagnosis not present

## 2017-09-22 DIAGNOSIS — Z90710 Acquired absence of both cervix and uterus: Secondary | ICD-10-CM | POA: Diagnosis not present

## 2017-09-22 DIAGNOSIS — R7302 Impaired glucose tolerance (oral): Secondary | ICD-10-CM | POA: Diagnosis not present

## 2017-09-22 DIAGNOSIS — Z87891 Personal history of nicotine dependence: Secondary | ICD-10-CM | POA: Insufficient documentation

## 2017-09-22 DIAGNOSIS — R21 Rash and other nonspecific skin eruption: Secondary | ICD-10-CM

## 2017-09-22 DIAGNOSIS — R1900 Intra-abdominal and pelvic swelling, mass and lump, unspecified site: Secondary | ICD-10-CM | POA: Diagnosis not present

## 2017-09-22 DIAGNOSIS — F17210 Nicotine dependence, cigarettes, uncomplicated: Secondary | ICD-10-CM

## 2017-09-22 DIAGNOSIS — I10 Essential (primary) hypertension: Secondary | ICD-10-CM

## 2017-09-22 DIAGNOSIS — N824 Other female intestinal-genital tract fistulae: Secondary | ICD-10-CM | POA: Insufficient documentation

## 2017-09-22 DIAGNOSIS — D72829 Elevated white blood cell count, unspecified: Secondary | ICD-10-CM

## 2017-09-22 LAB — BASIC METABOLIC PANEL
BUN: 17 mg/dL (ref 6–23)
CO2: 31 mEq/L (ref 19–32)
Calcium: 9.7 mg/dL (ref 8.4–10.5)
Chloride: 101 mEq/L (ref 96–112)
Creatinine, Ser: 0.99 mg/dL (ref 0.40–1.20)
GFR: 74.49 mL/min (ref >60.00)
Glucose, Bld: 99 mg/dL (ref 70–99)
Potassium: 3.3 mEq/L — ABNORMAL LOW (ref 3.5–5.1)
Sodium: 138 mEq/L (ref 135–145)

## 2017-09-22 LAB — URINALYSIS, ROUTINE W REFLEX MICROSCOPIC
Nitrite: NEGATIVE
Specific Gravity, Urine: >=1.03 — AB (ref 1.000–1.030)
Urine Glucose: NEGATIVE
Urobilinogen, UA: 0.2 (ref 0.0–1.0)
pH: 5.5 (ref 5.0–8.0)

## 2017-09-22 LAB — HEMOGLOBIN A1C: Hgb A1c MFr Bld: 6 % (ref 4.6–6.5)

## 2017-09-22 LAB — CBC WITH DIFFERENTIAL/PLATELET
Basophils Absolute: 0.1 10*3/uL (ref 0.0–0.1)
Basophils Relative: 1 % (ref 0.0–3.0)
Eosinophils Absolute: 0.2 10*3/uL (ref 0.0–0.7)
Eosinophils Relative: 2 % (ref 0.0–5.0)
HCT: 42.6 % (ref 36.0–46.0)
Hemoglobin: 14.2 g/dL (ref 12.0–15.0)
Lymphocytes Relative: 20.9 % (ref 12.0–46.0)
Lymphs Abs: 2.1 10*3/uL (ref 0.7–4.0)
MCHC: 33.4 g/dL (ref 30.0–36.0)
MCV: 90.4 fl (ref 78.0–100.0)
Monocytes Absolute: 0.8 10*3/uL (ref 0.1–1.0)
Monocytes Relative: 8.1 % (ref 3.0–12.0)
Neutro Abs: 6.7 10*3/uL (ref 1.4–7.7)
Neutrophils Relative %: 68 % (ref 43.0–77.0)
Platelets: 233 10*3/uL (ref 150.0–400.0)
RBC: 4.71 Mil/uL (ref 3.87–5.11)
RDW: 14.2 % (ref 11.5–15.5)
WBC: 9.8 10*3/uL (ref 4.0–10.5)

## 2017-09-22 MED ORDER — TRIAMCINOLONE ACETONIDE 0.1 % EX CREA: 1.0000 "application " | TOPICAL_CREAM | Freq: Two times a day (BID) | CUTANEOUS | 0 refills | Status: DC

## 2017-09-22 MED ORDER — FLUTICASONE-SALMETEROL 250-50 MCG/DOSE IN AEPB: 1.0000 | INHALATION_SPRAY | Freq: Two times a day (BID) | RESPIRATORY_TRACT | 3 refills | Status: DC

## 2017-09-22 MED ORDER — POTASSIUM CHLORIDE CRYS ER 20 MEQ PO TBCR: 20.0000 meq | EXTENDED_RELEASE_TABLET | Freq: Every day | ORAL | 2 refills | Status: DC

## 2017-09-22 NOTE — Patient Instructions (Signed)
Follow up with Dr. Phineas Real and Dr. Benson Norway as scheduled.  Please call the office for any needs.

## 2017-09-22 NOTE — Assessment & Plan Note (Signed)
C/w mild dermatitis, for triam cr prn 

## 2017-09-22 NOTE — Progress Notes (Signed)
Consult Note: Gyn-Onc   [name] 57 y.o. female  No chief complaint on file.   Assessment : Cystic structure adjacent to the vaginal apex now measuring 1.1 x 1.0 x 1.8 cm.  (MRI obtained on September 15, 2017)   this is relatively stable and the radiologist feels is consistent with a colo-vaginal fistula.  Both ovaries are identified.   .  Plan: I once again reviewed the MRI findings with the patient and explained what a colo-vaginal fistula was.  Given that the patient is relatively asymptomatic except for occasional discharge I would recommend observation.  On the other hand if problems worsened I would suggest she see a general surgeon for consideration of surgical resection.  At this juncture we will release her from our practice and she will return to see Dr. Phineas Real on an annual basis or as needed.  Interval history: Patient returns today as previously scheduled.  She is now had another MRI on September 15, 2017 showing that the cystic structure in the pelvis now measures 1.1 x 1.0 x 1.8 cm (stable) and is consistent with a colo-vaginal fistula.  Both ovaries are identified separate and appear normal.  She has not had any vaginal discharge or bleeding for over 2-1/2 weeks.  HPI: 57 year old who is seen in consultation at the request of Dr. Phineas Real regarding vaginal bleeding.  Patient reports that the vaginal bleeding started approximately 4 years ago.  It is very intermittent sometimes with just 1 day of spotting but on occasion some heavy clots.  It is not associated with sexual intercourse.  She has been evaluated by Dr. Phineas Real on several visits and on the most recent visit blood in the vagina was seen although no source of bleeding was noted.  She has had a Pap smear that was normal.  Patient has a past history of undergoing hysterectomy for fibroids.  Her ovaries remain in place.  In August 2018 the patient was admitted with sepsis associated with a Colo- vesicle fistula.  A drain was  placed and the patient was treated with antibiotics.  A most recent CT scan (March 13, 2017) apparently shows resolution of the fistula.  It is noted that at the site of the prior fistula between the sigmoid colon and the urinary bladder there is a bandlike density which "probably represents residual fistula".  It is thought that this might also be scarring given the lack of internal gas.  Currently the patient denies any pelvic pain and has no fevers.  Further evaluation with MRI confirmed that this was a colo-vaginal fistula and there is no evidence of malignancy.  Review of Systems:10 point review of systems is negative except as noted in interval history.   Vitals: There were no vitals taken for this visit.  Physical Exam: General : The patient is a healthy woman in no acute distress.       Allergies  Allergen Reactions  . Morphine Itching  . Shrimp [Shellfish Allergy] Itching and Other (See Comments)    Tongue burns also  . Tramadol Other (See Comments)    Caused confusion  . Latex Rash    Past Medical History:  Diagnosis Date  . Alcohol abuse    abuse- moderate years ago - states only drinks now on weekends -2 to 8 driinks   . Asthma   . COLONIC POLYPS, HX OF 04/05/2010  . DIVERTICULITIS, HX OF 04/05/2010  . DJD (degenerative joint disease)    right knee, mot to severe  .  GERD (gastroesophageal reflux disease)    no meds  . Heart murmur    hx of   . Hyperlipidemia   . Hypertension   . Impaired glucose tolerance 12/06/2013  . Obesity (BMI 30-39.9)   . PALPITATIONS, HX OF 09/14/2007    Past Surgical History:  Procedure Laterality Date  . ABDOMINAL HYSTERECTOMY  age 36   fibroids  . COLONOSCOPY WITH PROPOFOL N/A 07/25/2016   Procedure: COLONOSCOPY WITH PROPOFOL;  Surgeon: Carol Ada, MD;  Location: [facility];  Service: Endoscopy;  Laterality: N/A;  . colonscopy     x 2  . IR RADIOLOGIST EVAL & MGMT  08/12/2016  . IR RADIOLOGIST EVAL & MGMT  08/21/2016  . KNEE  ARTHROSCOPY     left   . TOTAL KNEE ARTHROPLASTY  07/29/2011   Procedure: TOTAL KNEE ARTHROPLASTY;  Surgeon: Mauri Pole, MD;  Location: [facility];  Service: Orthopedics;  Laterality: Right;  . TOTAL KNEE ARTHROPLASTY Left 12/14/2012   Procedure: LEFT TOTAL KNEE ARTHROPLASTY;  Surgeon: Mauri Pole, MD;  Location: [facility];  Service: Orthopedics;  Laterality: Left;    Current Outpatient Medications  Medication Sig Dispense Refill  . atorvastatin (LIPITOR) 10 MG tablet TAKE 1 TABLET BY MOUTH EVERY DAY 90 tablet 1  . Fluticasone-Salmeterol (ADVAIR DISKUS) 250-50 MCG/DOSE AEPB Inhale 1 puff into the lungs 2 (two) times daily. 180 each 3  . ibuprofen (ADVIL,MOTRIN) 600 MG tablet Take 600 mg by mouth every 6 (six) hours as needed.  0  . losartan-hydrochlorothiazide (HYZAAR) 100-25 MG tablet Take 1 tablet by mouth daily. 90 tablet 3  . meloxicam (MOBIC) 15 MG tablet TAKE 1 TABLET BY MOUTH EVERY DAY 30 tablet 0  . potassium chloride SA (K-DUR,KLOR-CON) 20 MEQ tablet Take 1 tablet (20 mEq total) by mouth daily. 30 tablet 2  . predniSONE (DELTASONE) 50 MG tablet Take 1 tablet (50 mg total) by mouth daily. 5 tablet 0  . PROAIR HFA 108 (90 Base) MCG/ACT inhaler INHALE 1 TO 2 PUFFS BY MOUTH EVERY 6 HOURS AS NEEDED FOR WHEEZE 8.5 Inhaler 5  . triamcinolone (NASACORT) 55 MCG/ACT AERO nasal inhaler Place 2 sprays into the nose daily. 1 Inhaler 0  . triamcinolone cream (KENALOG) 0.1 % Apply 1 application topically 2 (two) times daily. 30 g 0   No current facility-administered medications for this visit.     Social History   Socioeconomic History  . Marital status: Married    Spouse name: Not on file  . Number of children: 1  . Years of education: Not on file  . Highest education level: Not on file  Occupational History  . Occupation: Scientist, forensic: [employer]  . Financial resource strain: Not on file  . Food insecurity:    Worry: Not on file    Inability: Not on file  .  Transportation needs:    Medical: Not on file    Non-medical: Not on file  Tobacco Use  . Smoking status: Former Smoker    Packs/day: 0.50    Years: 7.00    Pack years: 3.50    Types: Cigarettes    Last attempt to quit: 02/11/1988    Years since quitting: 29.6  . Smokeless tobacco: Never Used  Substance and Sexual Activity  . Alcohol use: Yes    Comment: Occas  . Drug use: No    Comment: smoked marajuania x 10 years.  Quit in 1985.  Marland Kitchen Sexual activity: Yes  Birth control/protection: Surgical    Comment: 1st intercourse 57 yo-Fewer than 5 partners  Lifestyle  . Physical activity:    Days per week: Not on file    Minutes per session: Not on file  . Stress: Not on file  Relationships  . Social connections:    Talks on phone: Not on file    Gets together: Not on file    Attends religious service: Not on file    Active member of club or organization: Not on file    Attends meetings of clubs or organizations: Not on file    Relationship status: Not on file  . Intimate partner violence:    Fear of current or ex partner: Not on file    Emotionally abused: Not on file    Physically abused: Not on file    Forced sexual activity: Not on file  Other Topics Concern  . Not on file  Social History Narrative   Environmental consultant on file.  [name] April 05, 2010 12:04 PM    Family History  Problem Relation Age of Onset  . Stroke Mother   . COPD Father   . Lymphoma Sister       Marti Sleigh, MD 09/22/2017, 3:22 PM

## 2017-09-22 NOTE — Patient Instructions (Addendum)
Please take all new medication as prescribed - the cream  Please continue all other medications as before, and refills have been done if requested.  Please have the pharmacy call with any other refills you may need.  Please continue your efforts at being more active, low cholesterol diet, and weight control.  Please keep your appointments with your specialists as you may have planned  Please go to the LAB in the Basement (turn left off the elevator) for the tests to be done today  You will be contacted by phone if any changes need to be made immediately.  Otherwise, you will receive a letter about your results with an explanation, but please check with MyChart first.  Please remember to sign up for MyChart if you have not done so, as this will be important to you in the future with finding out test results, communicating by private email, and scheduling acute appointments online when needed.  Please return in 6 months, or sooner if needed

## 2017-09-22 NOTE — Progress Notes (Signed)
Subjective:    Patient ID: [name], female    DOB: Jul 20, 1960, 57 y.o.   MRN: [id number]  HPI  Here to f/u; overall doing ok,  Pt denies chest pain, increasing sob or doe, wheezing, orthopnea, PND, increased LE swelling, palpitations, dizziness or syncope.  Pt denies new neurological symptoms such as new headache, or facial or extremity weakness or numbness.  Pt denies polydipsia, polyuria, or low sugar episode.  Pt states overall good compliance with meds, mostly trying to follow appropriate diet, with wt overall stable,  but little exercise however.  Also has abnormal cloudy urine, asks for UA. Denies urinary symptoms such as dysuria, frequency, urgency, flank pain, hematuria or n/v, fever, chills.  Also has rash to right anterior forearm for several days with itching, hoping for a refill of a mild steroid cream BP Readings from Last 3 Encounters:  09/22/17 (!) 154/100  08/21/17 (!) 153/101  05/04/17 (!) 160/94   Wt Readings from Last 3 Encounters:  09/22/17 297 lb (134.7 kg)  05/04/17 296 lb 3.2 oz (134.4 kg)  04/14/17 294 lb (133.4 kg)   Past Medical History:  Diagnosis Date  . Alcohol abuse    abuse- moderate years ago - states only drinks now on weekends -2 to 8 driinks   . Asthma   . COLONIC POLYPS, HX OF 04/05/2010  . DIVERTICULITIS, HX OF 04/05/2010  . DJD (degenerative joint disease)    right knee, mot to severe  . GERD (gastroesophageal reflux disease)    no meds  . Heart murmur    hx of   . Hyperlipidemia   . Hypertension   . Impaired glucose tolerance 12/06/2013  . Obesity (BMI 30-39.9)   . PALPITATIONS, HX OF 09/14/2007   Past Surgical History:  Procedure Laterality Date  . ABDOMINAL HYSTERECTOMY  age 70   fibroids  . COLONOSCOPY WITH PROPOFOL N/A 07/25/2016   Procedure: COLONOSCOPY WITH PROPOFOL;  Surgeon: Carol Ada, MD;  Location: [facility];  Service: Endoscopy;  Laterality: N/A;  . colonscopy     x 2  . IR RADIOLOGIST EVAL & MGMT  08/12/2016  . IR  RADIOLOGIST EVAL & MGMT  08/21/2016  . KNEE ARTHROSCOPY     left   . TOTAL KNEE ARTHROPLASTY  07/29/2011   Procedure: TOTAL KNEE ARTHROPLASTY;  Surgeon: Mauri Pole, MD;  Location: [facility];  Service: Orthopedics;  Laterality: Right;  . TOTAL KNEE ARTHROPLASTY Left 12/14/2012   Procedure: LEFT TOTAL KNEE ARTHROPLASTY;  Surgeon: Mauri Pole, MD;  Location: [facility];  Service: Orthopedics;  Laterality: Left;    reports that she quit smoking about 29 years ago. Her smoking use included cigarettes. She has a 3.50 pack-year smoking history. She has never used smokeless tobacco. She reports that she drinks alcohol. She reports that she does not use drugs. family history includes COPD in her father; Lymphoma in her sister; Stroke in her mother. Allergies  Allergen Reactions  . Morphine Itching  . Shrimp [Shellfish Allergy] Itching and Other (See Comments)    Tongue burns also  . Tramadol Other (See Comments)    Caused confusion  . Latex Rash   Current Outpatient Medications on File Prior to Visit  Medication Sig Dispense Refill  . atorvastatin (LIPITOR) 10 MG tablet TAKE 1 TABLET BY MOUTH EVERY DAY 90 tablet 1  . ibuprofen (ADVIL,MOTRIN) 600 MG tablet Take 600 mg by mouth every 6 (six) hours as needed.  0  . PROAIR HFA 108 (90  Base) MCG/ACT inhaler INHALE 1 TO 2 PUFFS BY MOUTH EVERY 6 HOURS AS NEEDED FOR WHEEZE 8.5 Inhaler 5  . losartan-hydrochlorothiazide (HYZAAR) 100-12.5 MG tablet Take 1 tablet by mouth daily.     No current facility-administered medications on file prior to visit.    Review of Systems  Constitutional: Negative for other unusual diaphoresis or sweats HENT: Negative for ear discharge or swelling Eyes: Negative for other worsening visual disturbances Respiratory: Negative for stridor or other swelling  Gastrointestinal: Negative for worsening distension or other blood Genitourinary: Negative for retention or other urinary change Musculoskeletal: Negative for other MSK  pain or swelling Skin: Negative for color change or other new lesions Neurological: Negative for worsening tremors and other numbness  Psychiatric/Behavioral: Negative for worsening agitation or other fatigue All other system neg per pt    Objective:   Physical Exam BP (!) 154/100   Pulse 94   Temp 98 F (36.7 C) (Oral)   Ht 5\' 2"  (1.575 m)   Wt 297 lb (134.7 kg)   SpO2 94%   BMI 54.32 kg/m  VS noted,  Constitutional: Pt appears in NAD HENT: Head: NCAT.  Right Ear: External ear normal.  Left Ear: External ear normal.  Eyes: . Pupils are equal, round, and reactive to light. Conjunctivae and EOM are normal Nose: without d/c or deformity Neck: Neck supple. Gross normal ROM Cardiovascular: Normal rate and regular rhythm.   Pulmonary/Chest: Effort normal and breath sounds without rales or wheezing.  Abd:  Soft, NT, ND, + BS, no organomegaly Neurological: Pt is alert. At baseline orientation, motor grossly intact Skin: Skin is warm. + mild very mild bumpy erythem rashes slightly raised to right anterior arm, no other new lesions, no LE edema Psychiatric: Pt behavior is normal without agitation  No other exam findings    Assessment & Plan:

## 2017-09-22 NOTE — Assessment & Plan Note (Signed)
Lab Results  Component Value Date   HGBA1C 6.0 09/22/2017  stable overall by history and exam, recent data reviewed with pt, and pt to continue medical treatment as before,  to f/u any worsening symptoms or concerns

## 2017-09-22 NOTE — Assessment & Plan Note (Signed)
Mild persistent, pt asks for f/u cbc

## 2017-09-22 NOTE — Assessment & Plan Note (Signed)
Ok for UA.

## 2017-09-22 NOTE — Assessment & Plan Note (Signed)
Uncontrolled, strongly advised increased med tx but declines

## 2017-10-07 ENCOUNTER — Telehealth: Payer: Self-pay | Admitting: *Deleted

## 2017-10-07 NOTE — Telephone Encounter (Signed)
Patient left message asking for a call no detail message was left. I called her and received her voicemail, I asked her to call.

## 2017-10-08 ENCOUNTER — Telehealth: Payer: Self-pay

## 2017-10-08 NOTE — Telephone Encounter (Signed)
Patient called back and asked what to do next seen the gyn-oncologist as was released. Per note on 09/22/17. Told to follow up with Dr.Fontaine yearly. I told her this and she will call back to schedule exam.

## 2017-10-08 NOTE — Telephone Encounter (Signed)
Received incoming call from patient requesting to speak with Joylene John NP.  Melissa NP not able to come to phone at this time- pt voiced understanding and I let know I would give Melissa message and she will call pt back.  No other needs per pt at this time.

## 2017-10-09 ENCOUNTER — Telehealth: Payer: Self-pay | Admitting: Gynecologic Oncology

## 2017-10-09 NOTE — Telephone Encounter (Signed)
Returned call to patient.  Left message for her reinforcing the recommendations given to her at her last appt.  Advised her to please call the office for any further concerns.

## 2017-10-19 ENCOUNTER — Ambulatory Visit: Payer: Self-pay | Admitting: Internal Medicine

## 2017-10-19 ENCOUNTER — Telehealth: Payer: Self-pay | Admitting: Internal Medicine

## 2017-10-19 MED ORDER — PREDNISONE 10 MG PO TABS: ORAL_TABLET | ORAL | 0 refills | Status: DC

## 2017-10-19 NOTE — Telephone Encounter (Signed)
Ok this time only, but consider OV soon (in a few days) if not improved with prednisone

## 2017-10-19 NOTE — Addendum Note (Signed)
Addended by: Biagio Borg on: 10/19/2017 05:01 PM   Modules accepted: Orders

## 2017-10-19 NOTE — Telephone Encounter (Signed)
Copied from [place] [id number]. Topic: Quick Communication - Rx Refill/Question >> Oct 19, 2017  9:38 AM Oliver Pila B wrote: Medication: predniSONE (DELTASONE) 50 MG tablet [[id number]]  DISCONTINUED   Has the patient contacted their pharmacy? Yes.   (Agent: If no, request that the patient contact the pharmacy for the refill.) (Agent: If yes, when and what did the pharmacy advise?)  Preferred Pharmacy (with phone number or street name): [facility]  Agent: Please be advised that RX refills may take up to 3 business days. We ask that you follow-up with your pharmacy.

## 2017-10-19 NOTE — Telephone Encounter (Signed)
Pt phoned in to request refill of Prednisone.  Pt last  RX  08/29/16 per patient.  Pt has "moderate" wheezing with activity.  Denies SOB with rest.  States symptoms started about a week ago after a trip to the mountains. She denies cough or cold symptoms. Pt was advised that she needed to see the physician for this medication to be refilled but refused an appointment stating that she had no vacation time to use for an appointment.  Pt was advised to go to urgent care for her symptoms. Pt request NT to send message to Dr Jenny Reichmann for her request for Prednisone. Flow Coordinator notified that patient was recommended to go to Urgent care. Sending note to office for Dr Jenny Reichmann to review  Reason for Disposition . [1] MILD difficulty breathing (e.g., minimal/no SOB at rest, SOB with walking, pulse <100) AND [2] NEW-onset or WORSE than normal  Answer Assessment - Initial Assessment Questions 1. RESPIRATORY STATUS: "Describe your breathing?" (e.g., wheezing, shortness of breath, unable to speak, severe coughing)      Asthma symptoms have increased SOS on exertion 2. ONSET: "When did this breathing problem begin?"      Last week 3. PATTERN "Does the difficult breathing come and go, or has it been constant since it started?"      Comes and gose 4. SEVERITY: "How bad is your breathing?" (e.g., mild, moderate, severe)    - MILD: No SOB at rest, mild SOB with walking, speaks normally in sentences, can lay down, no retractions, pulse < 100.    - MODERATE: SOB at rest, SOB with minimal exertion and prefers to sit, cannot lie down flat, speaks in phrases, mild retractions, audible wheezing, pulse 100-120.    - SEVERE: Very SOB at rest, speaks in single words, struggling to breathe, sitting hunched forward, retractions, pulse > 120      moderate 5. RECURRENT SYMPTOM: "Have you had difficulty breathing before?" If so, ask: "When was the last time?" and "What happened that time?"      Yes in 2018 6. CARDIAC HISTORY: "Do you  have any history of heart disease?" (e.g., heart attack, angina, bypass surgery, angioplasty)      htn 7. LUNG HISTORY: "Do you have any history of lung disease?"  (e.g., pulmonary embolus, asthma, emphysema)     asthma 8. CAUSE: "What do you think is causing the breathing problem?"      [place] 9. OTHER SYMPTOMS: "Do you have any other symptoms? (e.g., dizziness, runny nose, cough, chest pain, fever)     no  11. TRAVEL: "Have you traveled out of the country in the last month?" (e.g., travel history, exposures)       no  Protocols used: BREATHING DIFFICULTY-A-AH

## 2017-10-19 NOTE — Telephone Encounter (Signed)
See triage note.

## 2017-10-19 NOTE — Telephone Encounter (Signed)
Does pt need appt.

## 2017-11-11 ENCOUNTER — Telehealth: Payer: Self-pay

## 2017-11-11 NOTE — Telephone Encounter (Signed)
Told [name] that Dr. Mora Bellman office notes , MRI, and [place] scan reports have been faxed to Dr. Carol Ada via epic for her appointment on Friday 11-20-17.

## 2017-11-19 ENCOUNTER — Emergency Department (HOSPITAL_COMMUNITY)
Admission: EM | Admit: 2017-11-19 | Discharge: 2017-11-20 | Disposition: A | Discharge status: Home / Self Care | Payer: BLUE CROSS/BLUE SHIELD | Attending: Emergency Medicine | Admitting: Emergency Medicine

## 2017-11-19 ENCOUNTER — Other Ambulatory Visit: Payer: Self-pay

## 2017-11-19 ENCOUNTER — Encounter (HOSPITAL_COMMUNITY): Payer: Self-pay

## 2017-11-19 DIAGNOSIS — Z96653 Presence of artificial knee joint, bilateral: Secondary | ICD-10-CM | POA: Insufficient documentation

## 2017-11-19 DIAGNOSIS — J45901 Unspecified asthma with (acute) exacerbation: Secondary | ICD-10-CM | POA: Diagnosis not present

## 2017-11-19 DIAGNOSIS — Z91040 Latex allergy status: Secondary | ICD-10-CM | POA: Insufficient documentation

## 2017-11-19 DIAGNOSIS — G4489 Other headache syndrome: Secondary | ICD-10-CM | POA: Diagnosis not present

## 2017-11-19 DIAGNOSIS — Z79899 Other long term (current) drug therapy: Secondary | ICD-10-CM | POA: Insufficient documentation

## 2017-11-19 DIAGNOSIS — R51 Headache: Secondary | ICD-10-CM | POA: Diagnosis not present

## 2017-11-19 DIAGNOSIS — I10 Essential (primary) hypertension: Secondary | ICD-10-CM | POA: Diagnosis not present

## 2017-11-19 LAB — URINALYSIS, ROUTINE W REFLEX MICROSCOPIC
Bilirubin Urine: NEGATIVE
Glucose, UA: NEGATIVE mg/dL
Ketones, ur: NEGATIVE mg/dL
Nitrite: NEGATIVE
Protein, ur: NEGATIVE mg/dL
Specific Gravity, Urine: 1.023 (ref 1.005–1.030)
pH: 5 (ref 5.0–8.0)

## 2017-11-19 LAB — CBC
HCT: 47.4 % — ABNORMAL HIGH (ref 36.0–46.0)
Hemoglobin: 14.6 g/dL (ref 12.0–15.0)
MCH: 28.5 pg (ref 26.0–34.0)
MCHC: 30.8 g/dL (ref 30.0–36.0)
MCV: 92.6 fL (ref 80.0–100.0)
Platelets: 293 10*3/uL (ref 150–400)
RBC: 5.12 MIL/uL — ABNORMAL HIGH (ref 3.87–5.11)
RDW: 13.4 % (ref 11.5–15.5)
WBC: 12 10*3/uL — ABNORMAL HIGH (ref 4.0–10.5)
nRBC: 0 % (ref 0.0–0.2)

## 2017-11-19 LAB — BASIC METABOLIC PANEL
Anion gap: 11 (ref 5–15)
BUN: 15 mg/dL (ref 6–20)
CO2: 23 mmol/L (ref 22–32)
Calcium: 9.6 mg/dL (ref 8.9–10.3)
Chloride: 105 mmol/L (ref 98–111)
Creatinine, Ser: 1.06 mg/dL — ABNORMAL HIGH (ref 0.44–1.00)
GFR calc Af Amer: >60 mL/min (ref >60)
GFR calc non Af Amer: 58 mL/min — ABNORMAL LOW (ref >60)
Glucose, Bld: 96 mg/dL (ref 70–99)
Potassium: 3.7 mmol/L (ref 3.5–5.1)
Sodium: 139 mmol/L (ref 135–145)

## 2017-11-19 LAB — I-STAT BETA HCG BLOOD, ED (MC, WL, AP ONLY): I-stat hCG, quantitative: 11.1 m[IU]/mL — ABNORMAL HIGH (ref <5)

## 2017-11-19 LAB — CBG MONITORING, ED: Glucose-Capillary: 89 mg/dL (ref 70–99)

## 2017-11-19 NOTE — ED Triage Notes (Addendum)
Pt here for having higher blood pressure than normal.  No blurred vision. States she has been feeling dizzy since this morning. A&Ox4.

## 2017-11-19 NOTE — ED Notes (Signed)
ED Provider at bedside. 

## 2017-11-20 MED ORDER — HYDROCODONE-ACETAMINOPHEN 5-325 MG PO TABS
1.0000 | ORAL_TABLET | Freq: Once | ORAL | Status: AC
  Administered 2017-11-20: 1 via ORAL
  Filled 2017-11-20: qty 1

## 2017-11-20 MED ORDER — POTASSIUM CHLORIDE CRYS ER 20 MEQ PO TBCR
20.0000 meq | EXTENDED_RELEASE_TABLET | Freq: Once | ORAL | Status: AC
  Administered 2017-11-20: 20 meq via ORAL
  Filled 2017-11-20: qty 1

## 2017-11-20 MED ORDER — HYDROCODONE-ACETAMINOPHEN 5-325 MG PO TABS: 1.0000 | ORAL_TABLET | Freq: Four times a day (QID) | ORAL | 0 refills | Status: DC | PRN

## 2017-11-20 NOTE — ED Provider Notes (Signed)
[facility] EMERGENCY DEPARTMENT Provider Note   CSN: [id number] Arrival date & time: 11/19/17  1935     History   Chief Complaint Chief Complaint  Patient presents with  . Hypertension  . Dizziness  . Headache    HPI [name] is a 57 y.o. female.  The history is provided by the patient.  Hypertension  This is a chronic problem. The problem has been gradually worsening. Associated symptoms include headaches. Pertinent negatives include no chest pain, no abdominal pain and no shortness of breath. Nothing aggravates the symptoms. Nothing relieves the symptoms.  Dizziness  Associated symptoms: headaches   Associated symptoms: no chest pain, no hearing loss, no shortness of breath, no tinnitus and no weakness   Headache   Pertinent negatives include no fever and no shortness of breath.  Presents with dizziness and elevated blood pressure.  She reports while she was at work today she felt mild dizziness, mild symptoms of vertigo.  No falls or weakness.  She reports she checked her blood pressure and it was elevated. Denies focal weakness.  No chest pain or shortness of breath. She takes BP meds daily, no recent changes.  She reports compliance.  She does not check her blood pressure frequently. Reports recent mild headache and sinusitis symptoms, she has been taking Sudafed She has no other acute complaints Past Medical History:  Diagnosis Date  . Alcohol abuse    abuse- moderate years ago - states only drinks now on weekends -2 to 8 driinks   . Asthma   . COLONIC POLYPS, HX OF 04/05/2010  . DIVERTICULITIS, HX OF 04/05/2010  . DJD (degenerative joint disease)    right knee, mot to severe  . GERD (gastroesophageal reflux disease)    no meds  . Heart murmur    hx of   . Hyperlipidemia   . Hypertension   . Impaired glucose tolerance 12/06/2013  . Obesity (BMI 30-39.9)   . PALPITATIONS, HX OF 09/14/2007    Patient Active Problem List   Diagnosis  Date Noted  . Leukocytosis 09/22/2017  . Abnormal urine color 09/22/2017  . Rash 09/22/2017  . Sepsis ([place]) 09/12/2016  . Colovesical fistula 09/12/2016  . Hypertension 07/28/2016  . Alcohol abuse 07/28/2016  . Obesity (BMI 30-39.9) 07/28/2016  . Abscess of bladder 07/28/2016  . Colonic diverticulum 07/28/2016  . Acute hypokalemia 07/28/2016  . Hyperlipidemia 07/10/2016  . Mass of left side of neck 07/10/2016  . Head and neck lymphadenopathy 07/10/2016  . Acute sinus infection 01/25/2016  . Eustachian tube disorder 01/25/2016  . Asthma exacerbation 05/18/2015  . Peripheral edema 03/16/2015  . Asthma 03/16/2015  . Right shoulder pain 03/16/2015  . Hypokalemia 10/04/2014  . Upper airway cough syndrome 10/03/2014  . Severe obesity (BMI >= 40) ([place]) 10/03/2014  . Lower back pain 05/25/2014  . Bilateral shoulder pain 05/25/2014  . Chest pain 04/14/2014  . Allergic rhinitis 12/06/2013  . Impaired glucose tolerance 12/06/2013  . Expected blood loss anemia 12/16/2012  . Morbid obesity ([place]) 12/15/2012  . S/P left TKA 12/14/2012  . Goiter 11/26/2012  . Preop exam for internal medicine 11/26/2012  . Diarrhea 02/25/2012  . Vaginal bleeding 04/30/2011  . Colon polyps 02/10/2011  . Eczema 02/10/2011  . Depression 02/10/2011  . Preventative health care 09/27/2010  . Anxiety state 04/05/2010  . DIVERTICULITIS, HX OF 04/05/2010  . PALPITATIONS, HX OF 09/14/2007  . Obesity 04/24/2007  . VOCAL CORD DISORDER 04/24/2007  . Extrinsic asthma  04/24/2007  . GERD 04/24/2007    Past Surgical History:  Procedure Laterality Date  . ABDOMINAL HYSTERECTOMY  age 16   fibroids  . COLONOSCOPY WITH PROPOFOL N/A 07/25/2016   Procedure: COLONOSCOPY WITH PROPOFOL;  Surgeon: Carol Ada, MD;  Location: [facility];  Service: Endoscopy;  Laterality: N/A;  . colonscopy     x 2  . IR RADIOLOGIST EVAL & MGMT  08/12/2016  . IR RADIOLOGIST EVAL & MGMT  08/21/2016  . KNEE ARTHROSCOPY     left   .  TOTAL KNEE ARTHROPLASTY  07/29/2011   Procedure: TOTAL KNEE ARTHROPLASTY;  Surgeon: Mauri Pole, MD;  Location: [facility];  Service: Orthopedics;  Laterality: Right;  . TOTAL KNEE ARTHROPLASTY Left 12/14/2012   Procedure: LEFT TOTAL KNEE ARTHROPLASTY;  Surgeon: Mauri Pole, MD;  Location: [facility];  Service: Orthopedics;  Laterality: Left;     OB History    Gravida  1   Para  1   Term      Preterm      AB      Living  1     SAB      TAB      Ectopic      Multiple      Live Births               Home Medications    Prior to Admission medications   Medication Sig Start Date End Date Taking? Authorizing Provider  atorvastatin (LIPITOR) 10 MG tablet TAKE 1 TABLET BY MOUTH EVERY DAY 08/04/17   Biagio Borg, MD  Fluticasone-Salmeterol (ADVAIR DISKUS) 250-50 MCG/DOSE AEPB Inhale 1 puff into the lungs 2 (two) times daily. 09/22/17   Biagio Borg, MD  HYDROcodone-acetaminophen (NORCO/VICODIN) 5-325 MG tablet Take 1 tablet by mouth every 6 (six) hours as needed. 11/20/17   Ripley Fraise, MD  ibuprofen (ADVIL,MOTRIN) 600 MG tablet Take 600 mg by mouth every 6 (six) hours as needed. 05/02/17   [provider]  losartan-hydrochlorothiazide (HYZAAR) 100-12.5 MG tablet Take 1 tablet by mouth daily.    [provider]  potassium chloride SA (K-DUR,KLOR-CON) 20 MEQ tablet Take 1 tablet (20 mEq total) by mouth daily. 09/22/17   Biagio Borg, MD  PROAIR HFA 108 [phone] Base) MCG/ACT inhaler INHALE 1 TO 2 PUFFS BY MOUTH EVERY 6 HOURS AS NEEDED FOR WHEEZE 08/04/17   Biagio Borg, MD  triamcinolone cream (KENALOG) 0.1 % Apply 1 application topically 2 (two) times daily. 09/22/17 09/22/18  Biagio Borg, MD    Family History Family History  Problem Relation Age of Onset  . Stroke Mother   . COPD Father   . Lymphoma Sister     Social History Social History   Tobacco Use  . Smoking status: Former Smoker    Packs/day: 0.50    Years: 7.00    Pack years: 3.50     Types: Cigarettes    Last attempt to quit: 02/11/1988    Years since quitting: 29.7  . Smokeless tobacco: Never Used  Substance Use Topics  . Alcohol use: Yes    Comment: Occas  . Drug use: No    Comment: smoked marajuania x 10 years.  Quit in 1985.     Allergies   Morphine; Shrimp [shellfish allergy]; Tramadol; and Latex   Review of Systems Review of Systems  Constitutional: Negative for fever.  HENT: Negative for hearing loss and tinnitus.   Eyes: Negative for visual disturbance.  Respiratory:  Negative for shortness of breath.   Cardiovascular: Negative for chest pain.  Gastrointestinal: Negative for abdominal pain.  Neurological: Positive for dizziness and headaches. Negative for syncope, speech difficulty, weakness and numbness.  All other systems reviewed and are negative.    Physical Exam Updated Vital Signs BP (!) 170/102 (BP Location: Left Arm)   Pulse 93   Temp 98.2 F (36.8 C) (Oral)   Resp 15   SpO2 98%   Physical Exam CONSTITUTIONAL: Well developed/well nourished HEAD: Normocephalic/atraumatic EYES: EOMI/PERRL, no nystagmus, no visual field deficit  no ptosis ENMT: Mucous membranes moist NECK: supple no meningeal signs, no bruits CV: S1/S2 noted, no murmurs/rubs/gallops noted LUNGS: Lungs are clear to auscultation bilaterally, no apparent distress ABDOMEN: soft, nontender, no rebound or guarding GU:no cva tenderness NEURO:Awake/alert, face symmetric, no arm or leg drift is noted Equal 5/5 strength with shoulder abduction, elbow flex/extension, wrist flex/extension in upper extremities and equal hand grips bilaterally Equal 5/5 strength with hip flexion,knee flex/extension, foot dorsi/plantar flexion Cranial nerves 3/4/5/6/08/18/08/11/12 tested and intact Gait normal without ataxia No past pointing Sensation to light touch intact in all extremities EXTREMITIES: pulses normal, full ROM SKIN: warm, color normal PSYCH: no abnormalities of mood  noted   ED Treatments / Results  Labs (all labs ordered are listed, but only abnormal results are displayed) Labs Reviewed  BASIC METABOLIC PANEL - Abnormal; Notable for the following components:      Result Value   Creatinine, Ser 1.06 (*)    GFR calc non Af Amer 58 (*)    All other components within normal limits  CBC - Abnormal; Notable for the following components:   WBC 12.0 (*)    RBC 5.12 (*)    HCT 47.4 (*)    All other components within normal limits  URINALYSIS, ROUTINE W REFLEX MICROSCOPIC - Abnormal; Notable for the following components:   APPearance HAZY (*)    Hgb urine dipstick SMALL (*)    Leukocytes, UA TRACE (*)    Bacteria, UA RARE (*)    All other components within normal limits  I-STAT BETA HCG BLOOD, ED ([facility], [facility], [facility] ONLY) - Abnormal; Notable for the following components:   I-stat hCG, quantitative 11.1 (*)    All other components within normal limits  CBG MONITORING, ED    EKG EKG Interpretation  Date/Time:  Thursday November 19 2017 23:38:29 EDT Ventricular Rate:  92 PR Interval:    QRS Duration: 95 QT Interval:  380 QTC Calculation: 471 R Axis:   52 Text Interpretation:  Sinus rhythm No significant change since last tracing Confirmed by Ripley Fraise [id number]) on 11/20/2017 12:02:18 AM   Radiology No results found.  Procedures Procedures   Medications Ordered in ED Medications  potassium chloride SA (K-DUR,KLOR-CON) CR tablet 20 mEq (has no administration in time range)  HYDROcodone-acetaminophen (NORCO/VICODIN) 5-325 MG per tablet 1 tablet (has no administration in time range)     Initial Impression / Assessment and Plan / ED Course  I have reviewed the triage vital signs and the nursing notes. Narcotic database reviewed and considered in decision making Pertinent labs & imaging results that were available during my care of the patient were reviewed by me and considered in my medical decision making (see chart for details).     She  presented for dizziness that triggered a check of her blood pressure and noted it was elevated.  She reports medication compliance, but does not know what her blood pressure is on a daily  basis. She reported mild dizziness throughout the day that is improving, but she reports she was always able to ambulate. Her neurologic exam is unremarkable.  She ambulated in the room multiple times without any difficulty.  She is well-appearing, no focal neuro deficits.  On further discussion, she reports taking Sudafed recently for sinusitis symptoms.  This is likely the culprit of her elevated blood pressure.  She was advised to stop her Sudafed, and she will start taking the full dose of Hyzaar 100/25 She also requests one-time dose of potassium, and will take her potassium prescription at home as this BP medicine caused hypokalemia before.  She has mild headache, request pain medications.  Advise close follow-up with PCP in the next 2 weeks for recheck for blood pressure as well as electrolytes We discussed strict ER return precautions.     Final Clinical Impressions(s) / ED Diagnoses   Final diagnoses:  Essential hypertension  Other headache syndrome    ED Discharge Orders         Ordered    HYDROcodone-acetaminophen (NORCO/VICODIN) 5-325 MG tablet  Every 6 hours PRN     11/20/17 0005           Ripley Fraise, MD 11/20/17 [phone]

## 2017-12-01 ENCOUNTER — Telehealth: Payer: Self-pay | Admitting: Internal Medicine

## 2017-12-01 NOTE — Telephone Encounter (Signed)
LVM to inform patient that her AVS with the ED did say to FU with PCP in 2 weeks.   FYI

## 2017-12-01 NOTE — Telephone Encounter (Signed)
Copied from [place] [id number]. Topic: General - Other >> Dec 01, 2017  2:45 PM Antonieta Iba C wrote: Reason for CRM: pt would like to make provider aware that she had a hospital visit and they changed her BP medication to losartan-hydrochlorothiazide (HYZAAR) 100-12.5 MG tablet

## 2017-12-02 NOTE — Telephone Encounter (Signed)
Noted! Thank you

## 2017-12-07 DIAGNOSIS — K5730 Diverticulosis of large intestine without perforation or abscess without bleeding: Secondary | ICD-10-CM | POA: Diagnosis not present

## 2017-12-08 ENCOUNTER — Other Ambulatory Visit: Payer: Self-pay | Admitting: Internal Medicine

## 2018-01-06 ENCOUNTER — Other Ambulatory Visit (INDEPENDENT_AMBULATORY_CARE_PROVIDER_SITE_OTHER): Payer: BLUE CROSS/BLUE SHIELD

## 2018-01-06 ENCOUNTER — Telehealth: Payer: Self-pay | Admitting: Podiatry

## 2018-01-06 ENCOUNTER — Encounter: Payer: Self-pay | Admitting: Podiatry

## 2018-01-06 ENCOUNTER — Ambulatory Visit (INDEPENDENT_AMBULATORY_CARE_PROVIDER_SITE_OTHER): Payer: BLUE CROSS/BLUE SHIELD | Admitting: Internal Medicine

## 2018-01-06 ENCOUNTER — Ambulatory Visit (INDEPENDENT_AMBULATORY_CARE_PROVIDER_SITE_OTHER): Payer: BLUE CROSS/BLUE SHIELD | Admitting: Podiatry

## 2018-01-06 ENCOUNTER — Other Ambulatory Visit: Payer: Self-pay | Admitting: Internal Medicine

## 2018-01-06 ENCOUNTER — Ambulatory Visit (INDEPENDENT_AMBULATORY_CARE_PROVIDER_SITE_OTHER): Payer: BLUE CROSS/BLUE SHIELD

## 2018-01-06 ENCOUNTER — Encounter: Payer: Self-pay | Admitting: Internal Medicine

## 2018-01-06 VITALS — BP 136/88 | HR 89 | Temp 98.0°F | Ht 63.0 in | Wt 299.0 lb

## 2018-01-06 DIAGNOSIS — R7302 Impaired glucose tolerance (oral): Secondary | ICD-10-CM | POA: Diagnosis not present

## 2018-01-06 DIAGNOSIS — I10 Essential (primary) hypertension: Secondary | ICD-10-CM

## 2018-01-06 DIAGNOSIS — M79609 Pain in unspecified limb: Secondary | ICD-10-CM

## 2018-01-06 DIAGNOSIS — M722 Plantar fascial fibromatosis: Secondary | ICD-10-CM | POA: Diagnosis not present

## 2018-01-06 DIAGNOSIS — M79676 Pain in unspecified toe(s): Secondary | ICD-10-CM | POA: Diagnosis not present

## 2018-01-06 DIAGNOSIS — J309 Allergic rhinitis, unspecified: Secondary | ICD-10-CM

## 2018-01-06 DIAGNOSIS — B351 Tinea unguium: Secondary | ICD-10-CM | POA: Diagnosis not present

## 2018-01-06 DIAGNOSIS — Z0000 Encounter for general adult medical examination without abnormal findings: Secondary | ICD-10-CM | POA: Diagnosis not present

## 2018-01-06 LAB — HEPATIC FUNCTION PANEL
ALT: 25 U/L (ref 0–35)
AST: 30 U/L (ref 0–37)
Albumin: 4 g/dL (ref 3.5–5.2)
Alkaline Phosphatase: 105 U/L (ref 39–117)
Bilirubin, Direct: 0.1 mg/dL (ref 0.0–0.3)
Total Bilirubin: 0.7 mg/dL (ref 0.2–1.2)
Total Protein: 7.7 g/dL (ref 6.0–8.3)

## 2018-01-06 LAB — BASIC METABOLIC PANEL
BUN: 17 mg/dL (ref 6–23)
CO2: 29 mEq/L (ref 19–32)
Calcium: 9.8 mg/dL (ref 8.4–10.5)
Chloride: 100 mEq/L (ref 96–112)
Creatinine, Ser: 1.05 mg/dL (ref 0.40–1.20)
GFR: 69.53 mL/min (ref >60.00)
Glucose, Bld: 99 mg/dL (ref 70–99)
Potassium: 3.5 mEq/L (ref 3.5–5.1)
Sodium: 138 mEq/L (ref 135–145)

## 2018-01-06 LAB — URINALYSIS, ROUTINE W REFLEX MICROSCOPIC
Bilirubin Urine: NEGATIVE
Hgb urine dipstick: NEGATIVE
Ketones, ur: NEGATIVE
Leukocytes, UA: NEGATIVE
Nitrite: NEGATIVE
Specific Gravity, Urine: 1.01 (ref 1.000–1.030)
Total Protein, Urine: NEGATIVE
Urine Glucose: NEGATIVE
Urobilinogen, UA: 0.2 (ref 0.0–1.0)
pH: 5.5 (ref 5.0–8.0)

## 2018-01-06 LAB — CBC WITH DIFFERENTIAL/PLATELET
Basophils Absolute: 0.1 10*3/uL (ref 0.0–0.1)
Basophils Relative: 0.8 % (ref 0.0–3.0)
Eosinophils Absolute: 0.2 10*3/uL (ref 0.0–0.7)
Eosinophils Relative: 2 % (ref 0.0–5.0)
HCT: 45.8 % (ref 36.0–46.0)
Hemoglobin: 15.3 g/dL — ABNORMAL HIGH (ref 12.0–15.0)
Lymphocytes Relative: 18.6 % (ref 12.0–46.0)
Lymphs Abs: 1.7 10*3/uL (ref 0.7–4.0)
MCHC: 33.5 g/dL (ref 30.0–36.0)
MCV: 89.1 fl (ref 78.0–100.0)
Monocytes Absolute: 0.7 10*3/uL (ref 0.1–1.0)
Monocytes Relative: 7.1 % (ref 3.0–12.0)
Neutro Abs: 6.7 10*3/uL (ref 1.4–7.7)
Neutrophils Relative %: 71.5 % (ref 43.0–77.0)
Platelets: 238 10*3/uL (ref 150.0–400.0)
RBC: 5.14 Mil/uL — ABNORMAL HIGH (ref 3.87–5.11)
RDW: 14.1 % (ref 11.5–15.5)
WBC: 9.3 10*3/uL (ref 4.0–10.5)

## 2018-01-06 LAB — LIPID PANEL
Cholesterol: 194 mg/dL (ref 0–200)
HDL: 67.2 mg/dL (ref >39.00)
LDL Cholesterol: 108 mg/dL — ABNORMAL HIGH (ref 0–99)
NonHDL: 126.55
Total CHOL/HDL Ratio: 3
Triglycerides: 94 mg/dL (ref 0.0–149.0)
VLDL: 18.8 mg/dL (ref 0.0–40.0)

## 2018-01-06 LAB — HEMOGLOBIN A1C: Hgb A1c MFr Bld: 6 % (ref 4.6–6.5)

## 2018-01-06 LAB — TSH: TSH: 1.93 u[IU]/mL (ref 0.35–4.50)

## 2018-01-06 MED ORDER — AZELASTINE-FLUTICASONE 137-50 MCG/ACT NA SUSP: NASAL | 11 refills | Status: DC

## 2018-01-06 MED ORDER — LOSARTAN POTASSIUM 100 MG PO TABS: 100.0000 mg | ORAL_TABLET | Freq: Every day | ORAL | 3 refills | Status: DC

## 2018-01-06 MED ORDER — TRIAMCINOLONE ACETONIDE 10 MG/ML IJ SUSP
10.0000 mg | Freq: Once | INTRAMUSCULAR | Status: AC
  Administered 2018-01-06: 10 mg

## 2018-01-06 MED ORDER — AZELASTINE HCL 0.1 % NA SOLN: 1.0000 | Freq: Two times a day (BID) | NASAL | 12 refills | Status: DC

## 2018-01-06 MED ORDER — HYDROCHLOROTHIAZIDE 25 MG PO TABS: 25.0000 mg | ORAL_TABLET | Freq: Every day | ORAL | 3 refills | Status: DC

## 2018-01-06 MED ORDER — HYDROCODONE-ACETAMINOPHEN 5-325 MG PO TABS: ORAL_TABLET | ORAL | 0 refills | Status: DC

## 2018-01-06 NOTE — Progress Notes (Signed)
Subjective:   Patient ID: [name], female   DOB: 57 y.o.   MRN: [id number]   HPI Patient states the right heel has been hurting a lot and making it hard to be active and she has a flattened type of arch noted and also has nails that are thick and she cannot cut and become difficult to wear shoe gear with   ROS      Objective:  Physical Exam  Neurovascular status intact with exquisite discomfort plantar aspect right heel at the insertional point tendon calcaneus with inflammation noted and thick yellow brittle nailbeds 1-5 both feet     Assessment:  Mycotic nail infection with painful right heel     Plan:  H&P discussed both conditions and today I injected the fascial right 3 mg Kenalog 5 Milgram Xylocaine applied fascial brace and then debrided nailbeds 1-5 both feet with no iatrogenic bleeding noted

## 2018-01-06 NOTE — Telephone Encounter (Signed)
I informed pt Dr. Paulla Dolly had ordered Hydrocodone and she would need to pick up the rx in the [facility] office prior to 5:00pm today.

## 2018-01-06 NOTE — Progress Notes (Signed)
Subjective:    Patient ID: [name], female    DOB: 08/12/1960, 57 y.o.   MRN: [id number]  HPI  Here for wellness and f/u;  Overall doing ok;  Pt denies Chest pain, worsening SOB, DOE, wheezing, orthopnea, PND, worsening LE edema, palpitations, dizziness or syncope.  Pt denies neurological change such as new headache, facial or extremity weakness.  Pt denies polydipsia, polyuria, or low sugar symptoms. Pt states overall good compliance with treatment and medications, good tolerability, and has been trying to follow appropriate diet.  Pt denies worsening depressive symptoms, suicidal ideation or panic. No fever, night sweats, wt loss, loss of appetite, or other constitutional symptoms.  Pt states good ability with ADL's, has low fall risk, home safety reviewed and adequate, no other significant changes in hearing or vision, and only occasionally active with exercise.  Does have several wks ongoing nasal allergy symptoms with clearish congestion, itch and sneezing, without fever, pain, ST, cough, swelling or wheezing.  No new complaints BP Readings from Last 3 Encounters:  01/06/18 136/88  11/20/17 (!) 149/91  09/22/17 (!) 160/95   Wt Readings from Last 3 Encounters:  01/06/18 299 lb (135.6 kg)  09/22/17 296 lb 3.2 oz (134.4 kg)  09/22/17 297 lb (134.7 kg)  Seen in ED with HA, blurred vision and some gait difficulty, and severe elevated BP, after taking sudafed for 2 wks for allerygy symptoms and ear congestion.  Has been taking back her losartan HCT since then she had from before with good readings, but now med on backorder Past Medical History:  Diagnosis Date  . Alcohol abuse    abuse- moderate years ago - states only drinks now on weekends -2 to 8 driinks   . Asthma   . COLONIC POLYPS, HX OF 04/05/2010  . DIVERTICULITIS, HX OF 04/05/2010  . DJD (degenerative joint disease)    right knee, mot to severe  . GERD (gastroesophageal reflux disease)    no meds  . Heart murmur    hx of     . Hyperlipidemia   . Hypertension   . Impaired glucose tolerance 12/06/2013  . Obesity (BMI 30-39.9)   . PALPITATIONS, HX OF 09/14/2007   Past Surgical History:  Procedure Laterality Date  . ABDOMINAL HYSTERECTOMY  age 57   fibroids  . COLONOSCOPY WITH PROPOFOL N/A 07/25/2016   Procedure: COLONOSCOPY WITH PROPOFOL;  Surgeon: Carol Ada, MD;  Location: [facility];  Service: Endoscopy;  Laterality: N/A;  . colonscopy     x 2  . IR RADIOLOGIST EVAL & MGMT  08/12/2016  . IR RADIOLOGIST EVAL & MGMT  08/21/2016  . KNEE ARTHROSCOPY     left   . TOTAL KNEE ARTHROPLASTY  07/29/2011   Procedure: TOTAL KNEE ARTHROPLASTY;  Surgeon: Mauri Pole, MD;  Location: [facility];  Service: Orthopedics;  Laterality: Right;  . TOTAL KNEE ARTHROPLASTY Left 12/14/2012   Procedure: LEFT TOTAL KNEE ARTHROPLASTY;  Surgeon: Mauri Pole, MD;  Location: [facility];  Service: Orthopedics;  Laterality: Left;    reports that she quit smoking about 29 years ago. Her smoking use included cigarettes. She has a 3.50 pack-year smoking history. She has never used smokeless tobacco. She reports that she drinks alcohol. She reports that she does not use drugs. family history includes COPD in her father; Lymphoma in her sister; Stroke in her mother. Allergies  Allergen Reactions  . Morphine Itching  . Shrimp [Shellfish Allergy] Itching and Other (See Comments)  Tongue burns also  . Tramadol Other (See Comments)    Caused confusion  . Latex Rash   Current Outpatient Medications on File Prior to Visit  Medication Sig Dispense Refill  . atorvastatin (LIPITOR) 10 MG tablet TAKE 1 TABLET BY MOUTH EVERY DAY 90 tablet 1  . Fluticasone-Salmeterol (ADVAIR DISKUS) 250-50 MCG/DOSE AEPB Inhale 1 puff into the lungs 2 (two) times daily. 180 each 3  . ibuprofen (ADVIL,MOTRIN) 600 MG tablet Take 600 mg by mouth every 6 (six) hours as needed.  0  . potassium chloride SA (K-DUR,KLOR-CON) 20 MEQ tablet Take 1 tablet (20 mEq total) by  mouth daily. 30 tablet 2  . PROAIR HFA 108 (90 Base) MCG/ACT inhaler INHALE 1 TO 2 PUFFS BY MOUTH EVERY 6 HOURS AS NEEDED FOR WHEEZE 8.5 Inhaler 5  . triamcinolone cream (KENALOG) 0.1 % APPLY TO AFFECTED AREA TWICE A DAY 30 g 0   No current facility-administered medications on file prior to visit.    Review of Systems Constitutional: Negative for other unusual diaphoresis, sweats, appetite or weight changes HENT: Negative for other worsening hearing loss, ear pain, facial swelling, mouth sores or neck stiffness.   Eyes: Negative for other worsening pain, redness or other visual disturbance.  Respiratory: Negative for other stridor or swelling Cardiovascular: Negative for other palpitations or other chest pain  Gastrointestinal: Negative for worsening diarrhea or loose stools, blood in stool, distention or other pain Genitourinary: Negative for hematuria, flank pain or other change in urine volume.  Musculoskeletal: Negative for myalgias or other joint swelling.  Skin: Negative for other color change, or other wound or worsening drainage.  Neurological: Negative for other syncope or numbness. Hematological: Negative for other adenopathy or swelling Psychiatric/Behavioral: Negative for hallucinations, other worsening agitation, SI, self-injury, or new decreased concentration All other system neg per pt    Objective:   Physical Exam BP 136/88   Pulse 89   Temp 98 F (36.7 C) (Oral)   Ht 5\' 3"  (1.6 m)   Wt 299 lb (135.6 kg)   SpO2 97%   BMI 52.97 kg/m  VS noted,  Constitutional: Pt is oriented to person, place, and time. Appears well-developed and well-nourished, in no significant distress and comfortable Head: Normocephalic and atraumatic  Eyes: Conjunctivae and EOM are normal. Pupils are equal, round, and reactive to light Right Ear: External ear normal without discharge Left Ear: External ear normal without discharge Nose: Nose without discharge or deformity Bilat tm's with mild  erythema.  Max sinus areas non tender.  Pharynx with mild erythema, no exudate Mouth/Throat: Oropharynx is without other ulcerations and moist  Neck: Normal range of motion. Neck supple. No JVD present. No tracheal deviation present or significant neck LA or mass Cardiovascular: Normal rate, regular rhythm, normal heart sounds and intact distal pulses.   Pulmonary/Chest: WOB normal and breath sounds without rales or wheezing  Abdominal: Soft. Bowel sounds are normal. NT. No HSM  Musculoskeletal: Normal range of motion. Exhibits no edema Lymphadenopathy: Has no other cervical adenopathy.  Neurological: Pt is alert and oriented to person, place, and time. Pt has normal reflexes. No cranial nerve deficit. Motor grossly intact, Gait intact Skin: Skin is warm and dry. No rash noted or new ulcerations Psychiatric:  Has normal mood and affect. Behavior is normal without agitation No other exam findings Lab Results  Component Value Date   WBC 9.3 01/06/2018   HGB 15.3 (H) 01/06/2018   HCT 45.8 01/06/2018   PLT 238.0 01/06/2018  GLUCOSE 99 01/06/2018   CHOL 194 01/06/2018   TRIG 94.0 01/06/2018   HDL 67.20 01/06/2018   LDLDIRECT 107.0 02/23/2012   LDLCALC 108 (H) 01/06/2018   ALT 25 01/06/2018   AST 30 01/06/2018   NA 138 01/06/2018   K 3.5 01/06/2018   CL 100 01/06/2018   CREATININE 1.05 01/06/2018   BUN 17 01/06/2018   CO2 29 01/06/2018   TSH 1.93 01/06/2018   INR 1.11 07/28/2016   HGBA1C 6.0 01/06/2018       Assessment & Plan:

## 2018-01-06 NOTE — Telephone Encounter (Signed)
Pt was seen in office today and was to have prescription sent to pharmacy. Pt called pharmacy and they have not received the prescription.   Did not see record of prescription in EPIC

## 2018-01-06 NOTE — Patient Instructions (Signed)
Please take all new medication as prescribed - the dymista for allergies  You can also take Mucinex (or it's generic off brand) for congestion, and tylenol as needed for pain.  Please continue all other medications as before, and refills have been done if requested - including the losartan 100 mg and HCT 25 mg per day  Please have the pharmacy call with any other refills you may need.  Please continue your efforts at being more active, low cholesterol diet, and weight control.  You are otherwise up to date with prevention measures today.  Please keep your appointments with your specialists as you may have planned  Please go to the LAB in the Basement (turn left off the elevator) for the tests to be done today  You will be contacted by phone if any changes need to be made immediately.  Otherwise, you will receive a letter about your results with an explanation, but please check with MyChart first.  Please remember to sign up for MyChart if you have not done so, as this will be important to you in the future with finding out test results, communicating by private email, and scheduling acute appointments online when needed.  Please return in 6 months, or sooner if needed

## 2018-01-06 NOTE — Telephone Encounter (Signed)
rx changed to astelin

## 2018-01-06 NOTE — Telephone Encounter (Signed)
Pharmacy comment: Alternative Requested:PRODUCT NOT COVERED, PLEASE SEPERATE DRUGS AND RESEND.

## 2018-01-06 NOTE — Telephone Encounter (Signed)
I reviewed today's clinicals and the injection Dr. Paulla Dolly gave was for the inflammation. Pt states Dr. Paulla Dolly stated he was going send hydrocodone electronically. I told pt I did not have that noted in the clinicals or orders, and I would check with him. Pt continued to say Dr. Paulla Dolly was going to call in a pain medication and I told her I would check with him and call again. Pt stated he said he would call in a pain medication and I told pt again I would call back with orders.

## 2018-01-06 NOTE — Addendum Note (Signed)
Addended by: Harriett Sine D on: 01/06/2018 02:52 PM   Modules accepted: Orders

## 2018-01-06 NOTE — Telephone Encounter (Signed)
Pt was seen in office today and was to have prescription sent to pharmacy. Pt called pharmacy and they have not received the prescription.   Did not see record of prescription in EPIC      Patient would like prescription sent to [name] on [address] She is getting ready to go out of town.

## 2018-01-07 NOTE — Assessment & Plan Note (Signed)
stable overall by history and exam, recent data reviewed with pt, and pt to continue medical treatment as before except to change the losartan HCT to 2 separate rx due to backorder,  to f/u any worsening symptoms or concerns,

## 2018-01-07 NOTE — Assessment & Plan Note (Signed)
stable overall by history and exam, recent data reviewed with pt, and pt to continue medical treatment as before,  to f/u any worsening symptoms or concerns  

## 2018-01-07 NOTE — Assessment & Plan Note (Signed)
Mild to mod, for dymista and mucinex asd,  to f/u any worsening symptoms or concerns

## 2018-01-07 NOTE — Assessment & Plan Note (Signed)

## 2018-01-19 ENCOUNTER — Encounter: Payer: BLUE CROSS/BLUE SHIELD | Admitting: Gynecology

## 2018-02-14 ENCOUNTER — Other Ambulatory Visit: Payer: Self-pay | Admitting: Internal Medicine

## 2018-02-19 ENCOUNTER — Encounter: Payer: Self-pay | Admitting: Podiatry

## 2018-02-19 ENCOUNTER — Ambulatory Visit (INDEPENDENT_AMBULATORY_CARE_PROVIDER_SITE_OTHER): Payer: BLUE CROSS/BLUE SHIELD | Admitting: Podiatry

## 2018-02-19 ENCOUNTER — Other Ambulatory Visit: Payer: Self-pay | Admitting: Internal Medicine

## 2018-02-19 DIAGNOSIS — M722 Plantar fascial fibromatosis: Secondary | ICD-10-CM

## 2018-02-19 MED ORDER — TRIAMCINOLONE ACETONIDE 10 MG/ML IJ SUSP
10.0000 mg | Freq: Once | INTRAMUSCULAR | Status: AC
  Administered 2018-02-19: 10 mg

## 2018-02-19 MED ORDER — MELOXICAM 15 MG PO TABS: 15.0000 mg | ORAL_TABLET | Freq: Every day | ORAL | 2 refills | Status: DC

## 2018-02-19 MED ORDER — HYDROCODONE-ACETAMINOPHEN 10-325 MG PO TABS: 1.0000 | ORAL_TABLET | Freq: Three times a day (TID) | ORAL | 0 refills | Status: DC | PRN

## 2018-02-19 NOTE — Patient Instructions (Signed)

## 2018-03-10 IMAGING — MG DIGITAL SCREENING BILATERAL MAMMOGRAM WITH CAD
8 of 12 series · 8 of 12 positions shown · non-contrast
Comparison: Previous exam(s).

CLINICAL DATA: Screening.

EXAM:
DIGITAL SCREENING BILATERAL MAMMOGRAM WITH CAD

[L CC (1 of 2)]
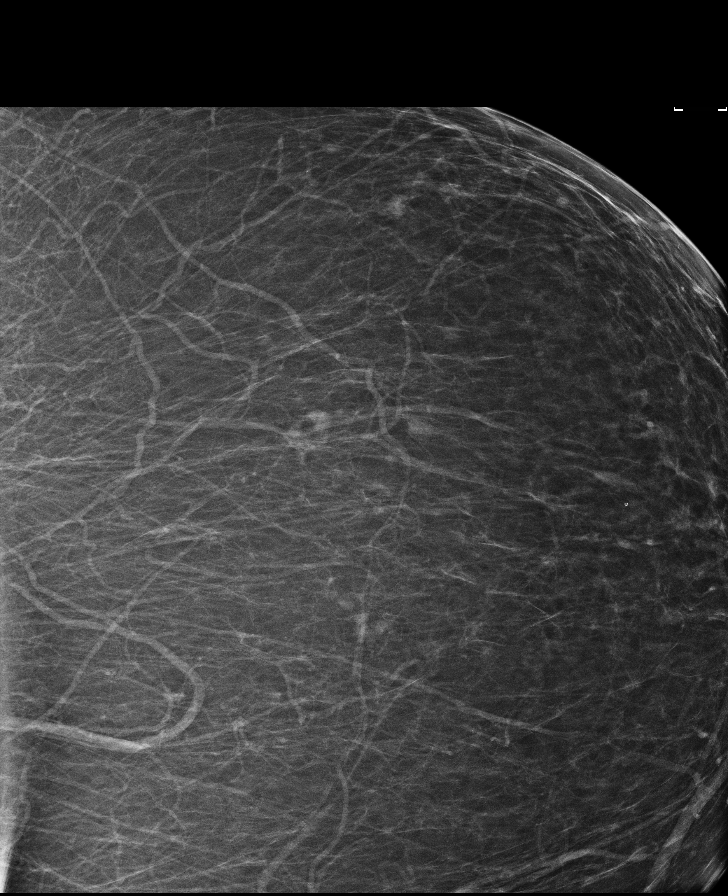

[R MLO (1 of 2)]
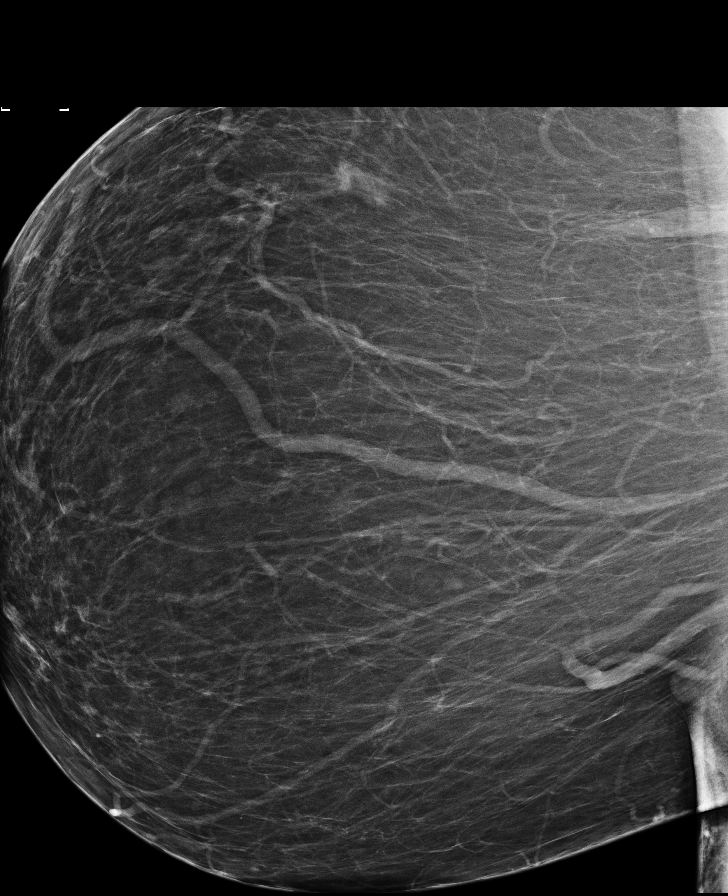

[L MLO (1 of 3)]
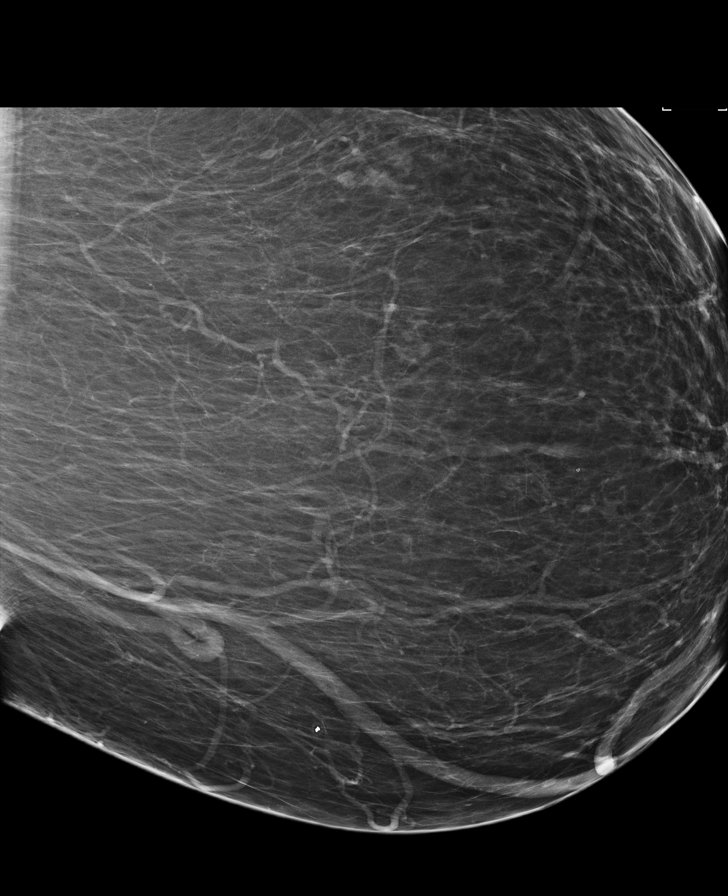

[R CC]
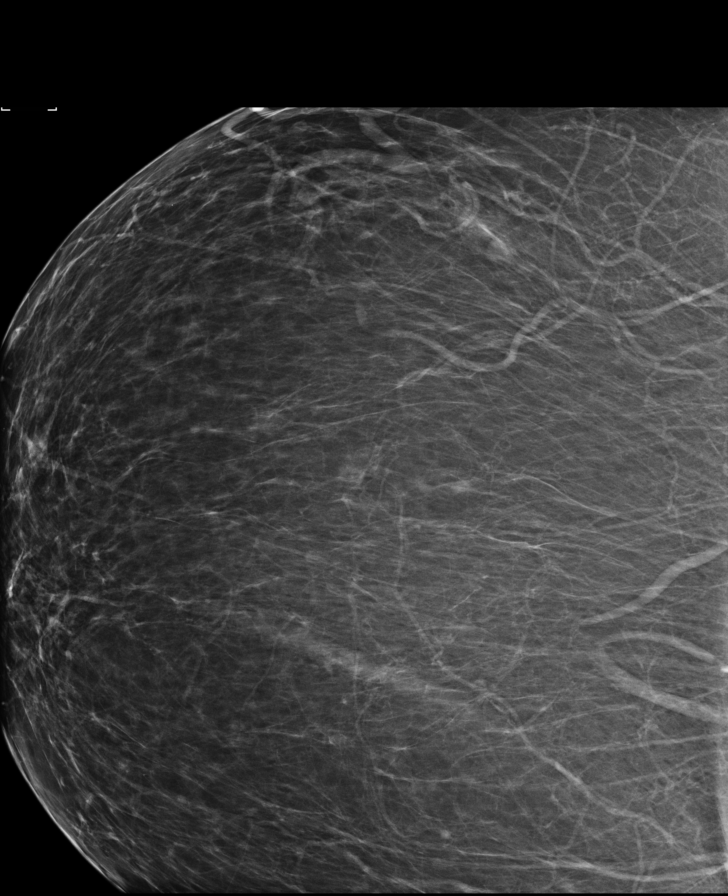

[L MLO (2 of 3)]
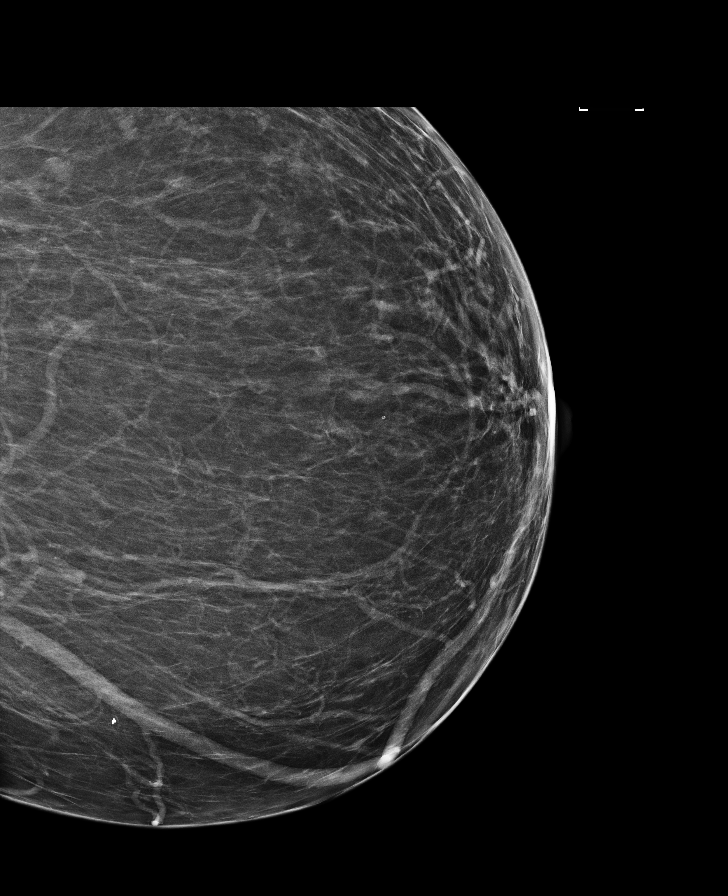

[L CC (2 of 2)]
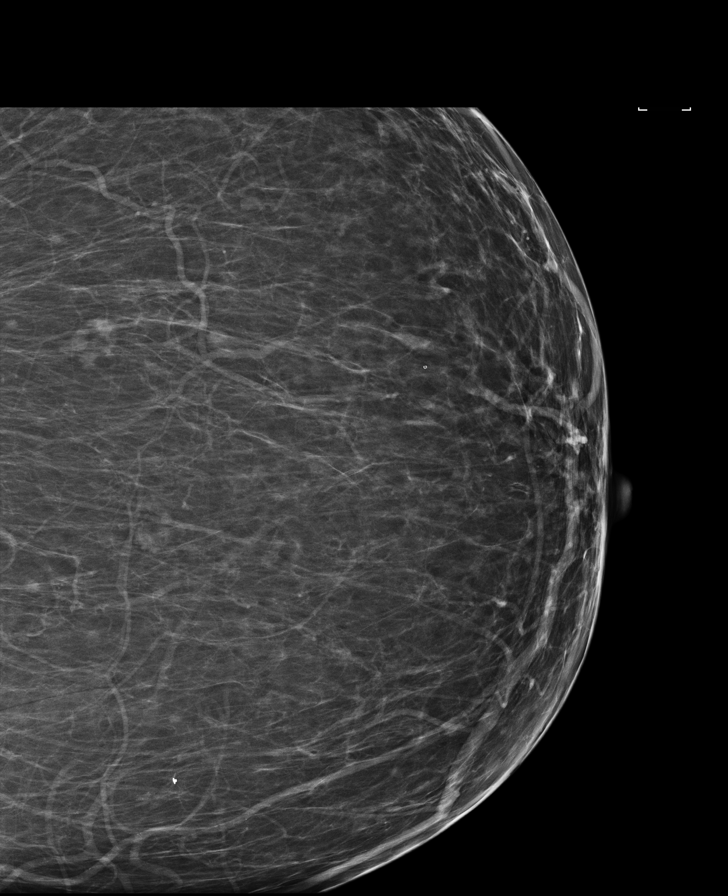

[L MLO (3 of 3)]
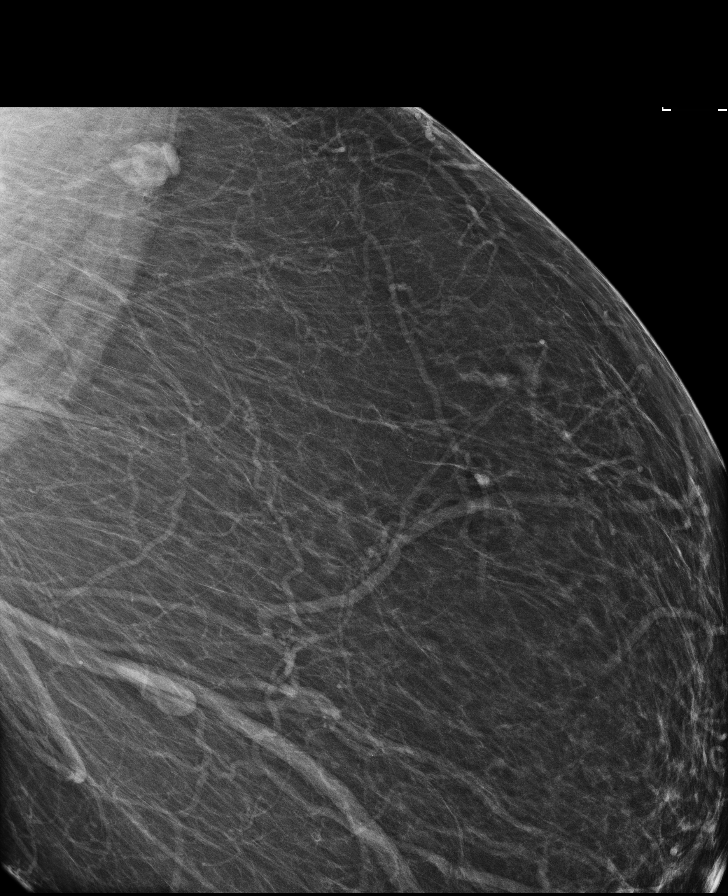

[R MLO (2 of 2)]
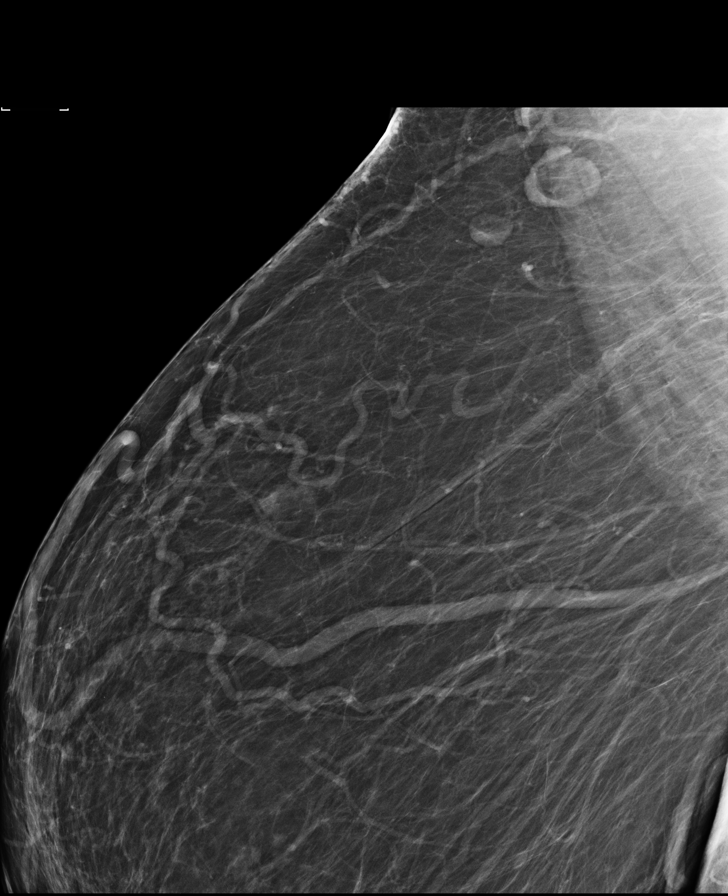

[8 of 12 positions shown; findings below may reference images not displayed]

ACR Breast Density Category b: There are scattered areas of
fibroglandular density.
FINDINGS: There are no findings suspicious for malignancy. Images were
processed with CAD.
IMPRESSION: No mammographic evidence of malignancy. A result letter of this
screening mammogram will be mailed directly to the patient.

RECOMMENDATION:
Screening mammogram in one year. (Code:AS-G-LCT)

BI-RADS CATEGORY  1: Negative.

## 2018-03-11 NOTE — Progress Notes (Signed)
Subjective:   Patient ID: [name], female   DOB: 58 y.o.   MRN: [id number]   HPI Patient states still having a lot of pain in my foot and states that it still is very very tender when pressed.  Patient states that it is difficult to bear complete weight on   ROS      Objective:  Physical Exam  Neurovascular status intact with continued exquisite discomfort right plantar fascia at the insertional point tendon into the calcaneus with mild discomfort of the fifth digit left     Assessment:  Acute plantar fasciitis right with inflammation fluid buildup     Plan:  H&P condition reviewed and today I did reinject the plantar fascial 73-month Kenalog 5 Milgram Xylocaine after sterile prep and applied sterile dressing.  I then went ahead and dispensed a night splint with all instructions on usage along with aggressive ice therapy and patient will be seen back in 4 weeks or earlier if needed

## 2018-04-02 ENCOUNTER — Encounter: Payer: BLUE CROSS/BLUE SHIELD | Admitting: Gynecology

## 2018-04-14 ENCOUNTER — Emergency Department (HOSPITAL_COMMUNITY)
Admission: EM | Admit: 2018-04-14 | Discharge: 2018-04-14 | Disposition: A | Discharge status: Home / Self Care | Payer: BLUE CROSS/BLUE SHIELD | Attending: Emergency Medicine | Admitting: Emergency Medicine

## 2018-04-14 ENCOUNTER — Other Ambulatory Visit: Payer: Self-pay | Admitting: Internal Medicine

## 2018-04-14 ENCOUNTER — Encounter (HOSPITAL_COMMUNITY): Payer: Self-pay | Admitting: Emergency Medicine

## 2018-04-14 ENCOUNTER — Emergency Department (HOSPITAL_COMMUNITY): Payer: BLUE CROSS/BLUE SHIELD

## 2018-04-14 DIAGNOSIS — R0602 Shortness of breath: Secondary | ICD-10-CM | POA: Diagnosis not present

## 2018-04-14 DIAGNOSIS — I10 Essential (primary) hypertension: Secondary | ICD-10-CM | POA: Insufficient documentation

## 2018-04-14 DIAGNOSIS — Z91040 Latex allergy status: Secondary | ICD-10-CM | POA: Insufficient documentation

## 2018-04-14 DIAGNOSIS — R000 Tachycardia, unspecified: Secondary | ICD-10-CM | POA: Diagnosis not present

## 2018-04-14 DIAGNOSIS — J45909 Unspecified asthma, uncomplicated: Secondary | ICD-10-CM | POA: Insufficient documentation

## 2018-04-14 DIAGNOSIS — Z87891 Personal history of nicotine dependence: Secondary | ICD-10-CM | POA: Diagnosis not present

## 2018-04-14 DIAGNOSIS — Z96651 Presence of right artificial knee joint: Secondary | ICD-10-CM | POA: Diagnosis not present

## 2018-04-14 DIAGNOSIS — R42 Dizziness and giddiness: Secondary | ICD-10-CM | POA: Diagnosis not present

## 2018-04-14 DIAGNOSIS — Z96652 Presence of left artificial knee joint: Secondary | ICD-10-CM | POA: Diagnosis not present

## 2018-04-14 DIAGNOSIS — Z79899 Other long term (current) drug therapy: Secondary | ICD-10-CM | POA: Diagnosis not present

## 2018-04-14 LAB — URINALYSIS, ROUTINE W REFLEX MICROSCOPIC
Bilirubin Urine: NEGATIVE
Glucose, UA: NEGATIVE mg/dL
Hgb urine dipstick: NEGATIVE
Ketones, ur: NEGATIVE mg/dL
Leukocytes,Ua: NEGATIVE
Nitrite: NEGATIVE
Protein, ur: NEGATIVE mg/dL
Specific Gravity, Urine: 1.018 (ref 1.005–1.030)
pH: 5 (ref 5.0–8.0)

## 2018-04-14 LAB — CBC
HCT: 44.1 % (ref 36.0–46.0)
Hemoglobin: 14.1 g/dL (ref 12.0–15.0)
MCH: 29 pg (ref 26.0–34.0)
MCHC: 32 g/dL (ref 30.0–36.0)
MCV: 90.7 fL (ref 80.0–100.0)
Platelets: 238 10*3/uL (ref 150–400)
RBC: 4.86 MIL/uL (ref 3.87–5.11)
RDW: 14.3 % (ref 11.5–15.5)
WBC: 9.6 10*3/uL (ref 4.0–10.5)
nRBC: 0 % (ref 0.0–0.2)

## 2018-04-14 LAB — BASIC METABOLIC PANEL
Anion gap: 7 (ref 5–15)
BUN: 14 mg/dL (ref 6–20)
CO2: 24 mmol/L (ref 22–32)
Calcium: 9.3 mg/dL (ref 8.9–10.3)
Chloride: 106 mmol/L (ref 98–111)
Creatinine, Ser: 0.92 mg/dL (ref 0.44–1.00)
GFR calc Af Amer: >60 mL/min (ref >60)
GFR calc non Af Amer: >60 mL/min (ref >60)
Glucose, Bld: 124 mg/dL — ABNORMAL HIGH (ref 70–99)
Potassium: 3.5 mmol/L (ref 3.5–5.1)
Sodium: 137 mmol/L (ref 135–145)

## 2018-04-14 LAB — I-STAT BETA HCG BLOOD, ED (MC, WL, AP ONLY): I-stat hCG, quantitative: 5.8 m[IU]/mL — ABNORMAL HIGH (ref <5)

## 2018-04-14 MED ORDER — SODIUM CHLORIDE 0.9% FLUSH: 3.0000 mL | Freq: Once | INTRAVENOUS | Status: DC

## 2018-04-14 NOTE — ED Triage Notes (Signed)
Patient reports dizziness x1 week, states that she realized her bp med was the wrong dose. She also reports 1 episode of vomiting Sunday and feeling more sob than normal- does have a history of asthma which she uses an inhaler for with some relief. She denies chest pain or edema to LEs. Pt a/ox4, resp e/u, nad. Speech clear. No neuro deficits.

## 2018-04-14 NOTE — Discharge Instructions (Addendum)
If you notice severe headache, new or worsening blurry vision or double vision, vomiting, trouble walking or severe dizziness, weakness or numbness in your arms or legs, or any other new/concerning symptoms then return to the ER for evaluation.  Call your doctor for outpatient management of your blood pressure.

## 2018-04-14 NOTE — ED Provider Notes (Signed)
[facility] EMERGENCY DEPARTMENT Provider Note   CSN: [id number] Arrival date & time: 04/14/18  6834    History   Chief Complaint Chief Complaint  Patient presents with  . Dizziness  . Shortness of Breath    HPI [name] is a 58 y.o. female.     HPI  58 year old female presents with dizziness.  Has been ongoing for over 1 week.  It occurs intermittently and not every day.  She will notice sometimes when quickly turning her head or while at work she will notice all of a sudden dizziness.  Sometimes she gets mild head pressure but no severe headache with it.  She wonders if her blood pressure is getting high because this is happened to her before.  She has no dizziness now.  It occurred earlier today at work when she turned quickly to talk to someone and lasted about 30 seconds.  No vomiting with this.  No chest pain.  She was asked about shortness of breath and states she chronically has this since weather has changed about 3 weeks ago but her inhaler typically takes care of this.  No symptoms now.  She has been compliant with her blood pressure medicine but states that she was originally on Hyzaar 100/25 but most recently was given 100/12.5 and wonders if she needs to increase the dose.  She has chronic blurry vision and thinks she needs better glasses but no new blurry vision or double vision.  Past Medical History:  Diagnosis Date  . Alcohol abuse    abuse- moderate years ago - states only drinks now on weekends -2 to 8 driinks   . Asthma   . COLONIC POLYPS, HX OF 04/05/2010  . DIVERTICULITIS, HX OF 04/05/2010  . DJD (degenerative joint disease)    right knee, mot to severe  . GERD (gastroesophageal reflux disease)    no meds  . Heart murmur    hx of   . Hyperlipidemia   . Hypertension   . Impaired glucose tolerance 12/06/2013  . Obesity (BMI 30-39.9)   . PALPITATIONS, HX OF 09/14/2007    Patient Active Problem List   Diagnosis Date Noted  .  Leukocytosis 09/22/2017  . Abnormal urine color 09/22/2017  . Rash 09/22/2017  . Sepsis ([place]) 09/12/2016  . Colovesical fistula 09/12/2016  . Hypertension 07/28/2016  . Alcohol abuse 07/28/2016  . Obesity (BMI 30-39.9) 07/28/2016  . Abscess of bladder 07/28/2016  . Colonic diverticulum 07/28/2016  . Acute hypokalemia 07/28/2016  . Hyperlipidemia 07/10/2016  . Mass of left side of neck 07/10/2016  . Head and neck lymphadenopathy 07/10/2016  . Acute sinus infection 01/25/2016  . Eustachian tube disorder 01/25/2016  . Asthma exacerbation 05/18/2015  . Peripheral edema 03/16/2015  . Asthma 03/16/2015  . Right shoulder pain 03/16/2015  . Hypokalemia 10/04/2014  . Upper airway cough syndrome 10/03/2014  . Severe obesity (BMI >= 40) ([place]) 10/03/2014  . Lower back pain 05/25/2014  . Bilateral shoulder pain 05/25/2014  . Chest pain 04/14/2014  . Allergic rhinitis 12/06/2013  . Impaired glucose tolerance 12/06/2013  . Expected blood loss anemia 12/16/2012  . Morbid obesity ([place]) 12/15/2012  . S/P left TKA 12/14/2012  . Goiter 11/26/2012  . Preop exam for internal medicine 11/26/2012  . Diarrhea 02/25/2012  . Vaginal bleeding 04/30/2011  . Colon polyps 02/10/2011  . Eczema 02/10/2011  . Depression 02/10/2011  . Preventative health care 09/27/2010  . Anxiety state 04/05/2010  . DIVERTICULITIS, HX OF  04/05/2010  . PALPITATIONS, HX OF 09/14/2007  . Obesity 04/24/2007  . VOCAL CORD DISORDER 04/24/2007  . Extrinsic asthma 04/24/2007  . GERD 04/24/2007    Past Surgical History:  Procedure Laterality Date  . ABDOMINAL HYSTERECTOMY  age 39   fibroids  . COLONOSCOPY WITH PROPOFOL N/A 07/25/2016   Procedure: COLONOSCOPY WITH PROPOFOL;  Surgeon: Carol Ada, MD;  Location: [facility] ENDOSCOPY;  Service: Endoscopy;  Laterality: N/A;  . colonscopy     x 2  . IR RADIOLOGIST EVAL & MGMT  08/12/2016  . IR RADIOLOGIST EVAL & MGMT  08/21/2016  . KNEE ARTHROSCOPY     left   . TOTAL KNEE  ARTHROPLASTY  07/29/2011   Procedure: TOTAL KNEE ARTHROPLASTY;  Surgeon: Mauri Pole, MD;  Location: [facility] ORS;  Service: Orthopedics;  Laterality: Right;  . TOTAL KNEE ARTHROPLASTY Left 12/14/2012   Procedure: LEFT TOTAL KNEE ARTHROPLASTY;  Surgeon: Mauri Pole, MD;  Location: [facility] ORS;  Service: Orthopedics;  Laterality: Left;     OB History    Gravida  1   Para  1   Term      Preterm      AB      Living  1     SAB      TAB      Ectopic      Multiple      Live Births               Home Medications    Prior to Admission medications   Medication Sig Start Date End Date Taking? Authorizing Provider  azelastine (ASTELIN) 0.1 % nasal spray Place 1 spray into both nostrils 2 (two) times daily. Use in each nostril as directed 01/06/18   Biagio Borg, MD  Azelastine-Fluticasone [facility]) [phone] MCG/ACT SUSP 1 spray each nostril twice per day 01/06/18   Biagio Borg, MD  Fluticasone-Salmeterol (ADVAIR DISKUS) 250-50 MCG/DOSE AEPB Inhale 1 puff into the lungs 2 (two) times daily. 09/22/17   Biagio Borg, MD  hydrochlorothiazide (HYDRODIURIL) 25 MG tablet Take 1 tablet (25 mg total) by mouth daily. 01/06/18   Biagio Borg, MD  HYDROcodone-acetaminophen (NORCO) 10-325 MG tablet Take 1 tablet by mouth every 8 (eight) hours as needed. 02/19/18   Wallene Huh, DPM  HYDROcodone-acetaminophen (NORCO/VICODIN) 5-325 MG tablet Take one tablet every 8 hours prn foot pain. 01/06/18   Wallene Huh, DPM  ibuprofen (ADVIL,MOTRIN) 600 MG tablet Take 600 mg by mouth every 6 (six) hours as needed. 05/02/17   [provider]  LIPITOR 10 MG tablet TAKE 1 TABLET BY MOUTH EVERY DAY 02/15/18   Biagio Borg, MD  losartan (COZAAR) 100 MG tablet Take 1 tablet (100 mg total) by mouth daily. 01/06/18   Biagio Borg, MD  losartan-hydrochlorothiazide [facility]) 100-25 MG tablet TAKE 1 TABLET BY MOUTH EVERY DAY 04/14/18   Biagio Borg, MD  meloxicam (MOBIC) 15 MG tablet Take 1 tablet (15 mg  total) by mouth daily. 02/19/18   Wallene Huh, DPM  potassium chloride SA (K-DUR,KLOR-CON) 20 MEQ tablet Take 1 tablet (20 mEq total) by mouth daily. 09/22/17   Biagio Borg, MD  PROAIR HFA 108 [phone] Base) MCG/ACT inhaler INHALE 1 TO 2 PUFFS BY MOUTH EVERY 6 HOURS AS NEEDED FOR WHEEZE 02/19/18   Biagio Borg, MD  triamcinolone cream (KENALOG) 0.1 % APPLY TO AFFECTED AREA TWICE A DAY 12/09/17   Biagio Borg, MD  Family History Family History  Problem Relation Age of Onset  . Stroke Mother   . COPD Father   . Lymphoma Sister     Social History Social History   Tobacco Use  . Smoking status: Former Smoker    Packs/day: 0.50    Years: 7.00    Pack years: 3.50    Types: Cigarettes    Last attempt to quit: 02/11/1988    Years since quitting: 30.1  . Smokeless tobacco: Never Used  Substance Use Topics  . Alcohol use: Yes    Comment: Occas  . Drug use: No    Comment: smoked marajuania x 10 years.  Quit in 1985.     Allergies   Morphine; Shrimp [shellfish allergy]; Tramadol; and Latex   Review of Systems Review of Systems  Constitutional: Negative for fever.  Respiratory: Negative for shortness of breath.   Cardiovascular: Negative for chest pain.  Gastrointestinal: Negative for vomiting.  Neurological: Positive for dizziness and headaches. Negative for weakness, light-headedness and numbness.  All other systems reviewed and are negative.    Physical Exam Updated Vital Signs BP (!) 147/94   Pulse 94   Temp 98.4 F (36.9 C) (Oral)   Resp 18   SpO2 98%   Physical Exam Vitals signs and nursing note reviewed.  Constitutional:      General: She is not in acute distress.    Appearance: She is well-developed. She is obese. She is not ill-appearing or diaphoretic.  HENT:     Head: Normocephalic and atraumatic.     Right Ear: External ear normal.     Left Ear: External ear normal.     Nose: Nose normal.  Eyes:     General:        Right eye: No discharge.         Left eye: No discharge.     Extraocular Movements: Extraocular movements intact.     Pupils: Pupils are equal, round, and reactive to light.  Cardiovascular:     Rate and Rhythm: Normal rate and regular rhythm.     Heart sounds: Normal heart sounds.  Pulmonary:     Effort: Pulmonary effort is normal.     Breath sounds: Normal breath sounds.  Abdominal:     Palpations: Abdomen is soft.     Tenderness: There is no abdominal tenderness.  Skin:    General: Skin is warm and dry.  Neurological:     Mental Status: She is alert.     Comments: CN 3-12 grossly intact. 5/5 strength in all 4 extremities. Grossly normal sensation. Normal finger to nose. Normal gait  Psychiatric:        Mood and Affect: Mood is not anxious.      ED Treatments / Results  Labs (all labs ordered are listed, but only abnormal results are displayed) Labs Reviewed  BASIC METABOLIC PANEL - Abnormal; Notable for the following components:      Result Value   Glucose, Bld 124 (*)    All other components within normal limits  I-STAT BETA HCG BLOOD, ED ([facility], [facility], [facility] ONLY) - Abnormal; Notable for the following components:   I-stat hCG, quantitative 5.8 (*)    All other components within normal limits  CBC  URINALYSIS, ROUTINE W REFLEX MICROSCOPIC    EKG EKG Interpretation  Date/Time:  Wednesday April 14 2018 10:06:58 EST Ventricular Rate:  100 PR Interval:  152 QRS Duration: 88 QT Interval:  374 QTC Calculation: 482 R Axis:  17 Text Interpretation:  Sinus tachycardia Cannot rule out Anterior infarct , age undetermined Abnormal ECG similar to Oct 2019 Confirmed by Sherwood Gambler [phone]) on 04/14/2018 11:19:58 AM   Radiology Dg Chest 2 View  Result Date: 04/14/2018 CLINICAL DATA:  Shortness of breath.  History of asthma. EXAM: CHEST - 2 VIEW COMPARISON:  04/30/2017 FINDINGS: Heart size is at the upper limits of normal. Slight tortuosity of the thoracic aorta. Pulmonary vascularity is normal. Lungs are clear. No  effusions. IMPRESSION: No active cardiopulmonary disease. Electronically Signed   By: Lorriane Shire M.D.   On: 04/14/2018 11:06    Procedures Procedures (including critical care time)  Medications Ordered in ED Medications - No data to display   Initial Impression / Assessment and Plan / ED Course  I have reviewed the triage vital signs and the nursing notes.  Pertinent labs & imaging results that were available during my care of the patient were reviewed by me and considered in my medical decision making (see chart for details).        Patient is well-appearing here.  She ambulates without difficulty.  Screening labs are benign.  ECG is unremarkable.  Very low suspicion this is ACS and she does not have any current dyspnea to suggest PE or dissection.  I think likely this could be related to blood pressure or it could be related to positional changes.  I think subarachnoid hemorrhage or other acute CNS pathology is unlikely.  I doubt she is getting recurrent TIAs.  At this point, she is moderately hypertensive and was instructed to call her doctor for blood pressure management as an outpatient.  However she appears stable for discharge home with return precautions.  Final Clinical Impressions(s) / ED Diagnoses   Final diagnoses:  Dizziness  Essential hypertension    ED Discharge Orders    None       Sherwood Gambler, MD 04/14/18 1159

## 2018-04-14 NOTE — ED Notes (Signed)
Patient verbalizes understanding of discharge instructions. Opportunity for questioning and answers were provided. Armband removed by staff, pt discharged from ED.  

## 2018-04-29 ENCOUNTER — Other Ambulatory Visit: Payer: Self-pay

## 2018-04-29 ENCOUNTER — Encounter: Payer: Self-pay | Admitting: Podiatry

## 2018-04-29 ENCOUNTER — Ambulatory Visit (INDEPENDENT_AMBULATORY_CARE_PROVIDER_SITE_OTHER): Payer: BLUE CROSS/BLUE SHIELD | Admitting: Podiatry

## 2018-04-29 DIAGNOSIS — B351 Tinea unguium: Secondary | ICD-10-CM

## 2018-04-29 DIAGNOSIS — M722 Plantar fascial fibromatosis: Secondary | ICD-10-CM

## 2018-04-29 DIAGNOSIS — M79609 Pain in unspecified limb: Secondary | ICD-10-CM | POA: Diagnosis not present

## 2018-04-29 MED ORDER — HYDROCODONE-ACETAMINOPHEN 5-325 MG PO TABS: 1.0000 | ORAL_TABLET | Freq: Four times a day (QID) | ORAL | 0 refills | Status: DC | PRN

## 2018-04-29 MED ORDER — TRIAMCINOLONE ACETONIDE 10 MG/ML IJ SUSP
10.0000 mg | Freq: Once | INTRAMUSCULAR | Status: AC
  Administered 2018-04-29: 10 mg

## 2018-04-30 ENCOUNTER — Telehealth: Payer: Self-pay | Admitting: Podiatry

## 2018-04-30 NOTE — Telephone Encounter (Signed)
I informed pt Dr. Paulla Dolly was wanting to decrease her hydrocodone dosage and she could use 2 if in severe pain. Pt states understanding. I told her she could also ice the area 3-4 times a day for 15-20 minutes/session and to protect the skin with a light towel.

## 2018-04-30 NOTE — Progress Notes (Signed)
Subjective:   Patient ID: [name], female   DOB: 58 y.o.   MRN: [id number]   HPI Patient presents stating that the right ankle has been sore and it seems to heal also but it seems like at this point it is more in the ankle and also her toenails are continuing to bother her and making it harder for her to wear shoe gear comfortably   ROS      Objective:  Physical Exam  Neurovascular status unchanged with patient found to have pain that is more the posterior tibial tendon as it comes underneath the medial malleolus with discomfort plantar proximal portion of the heel with the typical fasciitis under control currently with thick yellow brittle nailbeds 1-5 both feet     Assessment:  Posterior tibial tendinitis with some indications of proximal fasciitis with mycotic nail infection 1-5 both feet that are painful     Plan:  H&P condition reviewed and debrided nailbeds 1-5 both feet with no iatrogenic bleeding and it went ahead did a sheath injection after sterile prep posterior tib 3 mg Dexasone Kenalog 5 mg Xylocaine and discussed at one point it may require a more proximal fascial injection.  Reappoint as symptoms indicate

## 2018-04-30 NOTE — Telephone Encounter (Signed)
Patient received the wrong medication from Dr. Paulla Dolly. It was suppose to be Hydrocodone 10-325 and she received Hydrocodone 5-325. She is still having pain in her foot/swollen. Please call patient

## 2018-05-07 ENCOUNTER — Encounter: Payer: BLUE CROSS/BLUE SHIELD | Admitting: Gynecology

## 2018-05-11 ENCOUNTER — Other Ambulatory Visit: Payer: Self-pay

## 2018-05-11 ENCOUNTER — Emergency Department (HOSPITAL_COMMUNITY)
Admission: EM | Admit: 2018-05-11 | Discharge: 2018-05-11 | Disposition: A | Discharge status: Home / Self Care | Payer: BLUE CROSS/BLUE SHIELD | Attending: Emergency Medicine | Admitting: Emergency Medicine

## 2018-05-11 ENCOUNTER — Emergency Department (HOSPITAL_COMMUNITY): Payer: BLUE CROSS/BLUE SHIELD

## 2018-05-11 DIAGNOSIS — Z91040 Latex allergy status: Secondary | ICD-10-CM | POA: Insufficient documentation

## 2018-05-11 DIAGNOSIS — I10 Essential (primary) hypertension: Secondary | ICD-10-CM | POA: Diagnosis not present

## 2018-05-11 DIAGNOSIS — E785 Hyperlipidemia, unspecified: Secondary | ICD-10-CM | POA: Diagnosis not present

## 2018-05-11 DIAGNOSIS — R0602 Shortness of breath: Secondary | ICD-10-CM | POA: Insufficient documentation

## 2018-05-11 DIAGNOSIS — R0789 Other chest pain: Secondary | ICD-10-CM | POA: Diagnosis not present

## 2018-05-11 DIAGNOSIS — J45909 Unspecified asthma, uncomplicated: Secondary | ICD-10-CM | POA: Diagnosis not present

## 2018-05-11 DIAGNOSIS — Z79899 Other long term (current) drug therapy: Secondary | ICD-10-CM | POA: Insufficient documentation

## 2018-05-11 DIAGNOSIS — R079 Chest pain, unspecified: Secondary | ICD-10-CM | POA: Diagnosis not present

## 2018-05-11 DIAGNOSIS — Z87891 Personal history of nicotine dependence: Secondary | ICD-10-CM | POA: Diagnosis not present

## 2018-05-11 LAB — CBC
HCT: 48.9 % — ABNORMAL HIGH (ref 36.0–46.0)
Hemoglobin: 15.9 g/dL — ABNORMAL HIGH (ref 12.0–15.0)
MCH: 29.6 pg (ref 26.0–34.0)
MCHC: 32.5 g/dL (ref 30.0–36.0)
MCV: 91.1 fL (ref 80.0–100.0)
Platelets: 214 10*3/uL (ref 150–400)
RBC: 5.37 MIL/uL — ABNORMAL HIGH (ref 3.87–5.11)
RDW: 14.6 % (ref 11.5–15.5)
WBC: 9.3 10*3/uL (ref 4.0–10.5)
nRBC: 0 % (ref 0.0–0.2)

## 2018-05-11 LAB — BASIC METABOLIC PANEL
Anion gap: 19 — ABNORMAL HIGH (ref 5–15)
BUN: 17 mg/dL (ref 6–20)
CO2: 22 mmol/L (ref 22–32)
Calcium: 9.6 mg/dL (ref 8.9–10.3)
Chloride: 98 mmol/L (ref 98–111)
Creatinine, Ser: 1.26 mg/dL — ABNORMAL HIGH (ref 0.44–1.00)
GFR calc Af Amer: 55 mL/min — ABNORMAL LOW (ref >60)
GFR calc non Af Amer: 47 mL/min — ABNORMAL LOW (ref >60)
Glucose, Bld: 99 mg/dL (ref 70–99)
Potassium: 3.7 mmol/L (ref 3.5–5.1)
Sodium: 139 mmol/L (ref 135–145)

## 2018-05-11 LAB — TROPONIN I
Troponin I: <0.03 ng/mL (ref <0.03)
Troponin I: <0.03 ng/mL (ref <0.03)

## 2018-05-11 LAB — BRAIN NATRIURETIC PEPTIDE: B Natriuretic Peptide: 68.8 pg/mL (ref 0.0–100.0)

## 2018-05-11 MED ORDER — FENTANYL CITRATE (PF) 100 MCG/2ML IJ SOLN
25.0000 ug | Freq: Once | INTRAMUSCULAR | Status: DC
  Filled 2018-05-11: qty 2

## 2018-05-11 MED ORDER — ACETAMINOPHEN 325 MG PO TABS
650.0000 mg | ORAL_TABLET | Freq: Once | ORAL | Status: AC
  Administered 2018-05-11: 650 mg via ORAL
  Filled 2018-05-11: qty 2

## 2018-05-11 NOTE — Discharge Instructions (Signed)
As discussed, your evaluation today has been largely reassuring.  But, it is important that you monitor your condition carefully, and do not hesitate to return to the ED if you develop new, or concerning changes in your condition. ? ?Otherwise, please follow-up with your physician for appropriate ongoing care. ? ?

## 2018-05-11 NOTE — ED Provider Notes (Signed)
[facility] EMERGENCY DEPARTMENT Provider Note   CSN: [id number] Arrival date & time: 05/11/18  0609    History   Chief Complaint Chief Complaint  Patient presents with  . Chest Pain    HPI [name] is a 58 y.o. female.     HPI  Patient presents with chest pain. Patient states that she is generally well has had prior cardiac evaluation, reportedly unremarkable. She notes that today, she woke up with pain in the left parasternal area radiating to the back. There is no associated dyspnea, no cough, no nausea, no vomiting. Patient notes that onset was sudden, since onset pain has improved, without intervention Initially the pain is 9/10, is currently 6/10. No medication taken for relief. She notes that she has had prior similar pain, after twisting quickly.  Past Medical History:  Diagnosis Date  . Alcohol abuse    abuse- moderate years ago - states only drinks now on weekends -2 to 8 driinks   . Asthma   . COLONIC POLYPS, HX OF 04/05/2010  . DIVERTICULITIS, HX OF 04/05/2010  . DJD (degenerative joint disease)    right knee, mot to severe  . GERD (gastroesophageal reflux disease)    no meds  . Heart murmur    hx of   . Hyperlipidemia   . Hypertension   . Impaired glucose tolerance 12/06/2013  . Obesity (BMI 30-39.9)   . PALPITATIONS, HX OF 09/14/2007    Patient Active Problem List   Diagnosis Date Noted  . Leukocytosis 09/22/2017  . Abnormal urine color 09/22/2017  . Rash 09/22/2017  . Sepsis ([place]) 09/12/2016  . Colovesical fistula 09/12/2016  . Hypertension 07/28/2016  . Alcohol abuse 07/28/2016  . Obesity (BMI 30-39.9) 07/28/2016  . Abscess of bladder 07/28/2016  . Colonic diverticulum 07/28/2016  . Acute hypokalemia 07/28/2016  . Hyperlipidemia 07/10/2016  . Mass of left side of neck 07/10/2016  . Head and neck lymphadenopathy 07/10/2016  . Acute sinus infection 01/25/2016  . Eustachian tube disorder 01/25/2016  . Asthma  exacerbation 05/18/2015  . Peripheral edema 03/16/2015  . Asthma 03/16/2015  . Right shoulder pain 03/16/2015  . Hypokalemia 10/04/2014  . Upper airway cough syndrome 10/03/2014  . Severe obesity (BMI >= 40) ([place]) 10/03/2014  . Lower back pain 05/25/2014  . Bilateral shoulder pain 05/25/2014  . Chest pain 04/14/2014  . Allergic rhinitis 12/06/2013  . Impaired glucose tolerance 12/06/2013  . Expected blood loss anemia 12/16/2012  . Morbid obesity ([place]) 12/15/2012  . S/P left TKA 12/14/2012  . Goiter 11/26/2012  . Preop exam for internal medicine 11/26/2012  . Diarrhea 02/25/2012  . Vaginal bleeding 04/30/2011  . Colon polyps 02/10/2011  . Eczema 02/10/2011  . Depression 02/10/2011  . Preventative health care 09/27/2010  . Anxiety state 04/05/2010  . DIVERTICULITIS, HX OF 04/05/2010  . PALPITATIONS, HX OF 09/14/2007  . Obesity 04/24/2007  . VOCAL CORD DISORDER 04/24/2007  . Extrinsic asthma 04/24/2007  . GERD 04/24/2007    Past Surgical History:  Procedure Laterality Date  . ABDOMINAL HYSTERECTOMY  age 89   fibroids  . COLONOSCOPY WITH PROPOFOL N/A 07/25/2016   Procedure: COLONOSCOPY WITH PROPOFOL;  Surgeon: Carol Ada, MD;  Location: [facility];  Service: Endoscopy;  Laterality: N/A;  . colonscopy     x 2  . IR RADIOLOGIST EVAL & MGMT  08/12/2016  . IR RADIOLOGIST EVAL & MGMT  08/21/2016  . KNEE ARTHROSCOPY     left   . TOTAL  KNEE ARTHROPLASTY  07/29/2011   Procedure: TOTAL KNEE ARTHROPLASTY;  Surgeon: Mauri Pole, MD;  Location: [facility];  Service: Orthopedics;  Laterality: Right;  . TOTAL KNEE ARTHROPLASTY Left 12/14/2012   Procedure: LEFT TOTAL KNEE ARTHROPLASTY;  Surgeon: Mauri Pole, MD;  Location: [facility];  Service: Orthopedics;  Laterality: Left;     OB History    Gravida  1   Para  1   Term      Preterm      AB      Living  1     SAB      TAB      Ectopic      Multiple      Live Births               Home Medications    Prior  to Admission medications   Medication Sig Start Date End Date Taking? Authorizing Provider  azelastine (ASTELIN) 0.1 % nasal spray Place 1 spray into both nostrils 2 (two) times daily. Use in each nostril as directed 01/06/18   Biagio Borg, MD  Azelastine-Fluticasone [facility]) [phone] MCG/ACT SUSP 1 spray each nostril twice per day 01/06/18   Biagio Borg, MD  Fluticasone-Salmeterol (ADVAIR DISKUS) 250-50 MCG/DOSE AEPB Inhale 1 puff into the lungs 2 (two) times daily. 09/22/17   Biagio Borg, MD  hydrochlorothiazide (HYDRODIURIL) 25 MG tablet Take 1 tablet (25 mg total) by mouth daily. 01/06/18   Biagio Borg, MD  HYDROcodone-acetaminophen (NORCO) 10-325 MG tablet Take 1 tablet by mouth every 8 (eight) hours as needed. 02/19/18   Wallene Huh, DPM  HYDROcodone-acetaminophen (NORCO/VICODIN) 5-325 MG tablet Take one tablet every 8 hours prn foot pain. 01/06/18   Wallene Huh, DPM  HYDROcodone-acetaminophen (NORCO/VICODIN) 5-325 MG tablet Take 1 tablet by mouth every 6 (six) hours as needed for moderate pain. 04/29/18   Wallene Huh, DPM  ibuprofen (ADVIL,MOTRIN) 600 MG tablet Take 600 mg by mouth every 6 (six) hours as needed. 05/02/17   [provider]  LIPITOR 10 MG tablet TAKE 1 TABLET BY MOUTH EVERY DAY 02/15/18   Biagio Borg, MD  losartan (COZAAR) 100 MG tablet Take 1 tablet (100 mg total) by mouth daily. 01/06/18   Biagio Borg, MD  losartan-hydrochlorothiazide [facility]) 100-25 MG tablet TAKE 1 TABLET BY MOUTH EVERY DAY 04/14/18   Biagio Borg, MD  meloxicam (MOBIC) 15 MG tablet Take 1 tablet (15 mg total) by mouth daily. 02/19/18   Wallene Huh, DPM  potassium chloride SA (K-DUR,KLOR-CON) 20 MEQ tablet Take 1 tablet (20 mEq total) by mouth daily. 09/22/17   Biagio Borg, MD  PROAIR HFA 108 [phone] Base) MCG/ACT inhaler INHALE 1 TO 2 PUFFS BY MOUTH EVERY 6 HOURS AS NEEDED FOR WHEEZE 02/19/18   Biagio Borg, MD  triamcinolone cream (KENALOG) 0.1 % APPLY TO AFFECTED AREA TWICE A DAY  12/09/17   Biagio Borg, MD    Family History Family History  Problem Relation Age of Onset  . Stroke Mother   . COPD Father   . Lymphoma Sister     Social History Social History   Tobacco Use  . Smoking status: Former Smoker    Packs/day: 0.50    Years: 7.00    Pack years: 3.50    Types: Cigarettes    Last attempt to quit: 02/11/1988    Years since quitting: 30.2  . Smokeless tobacco: Never Used  Substance Use  Topics  . Alcohol use: Yes    Comment: Occas  . Drug use: No    Comment: smoked marajuania x 10 years.  Quit in 1985.     Allergies   Morphine; Shrimp [shellfish allergy]; Tramadol; and Latex   Review of Systems Review of Systems  Constitutional:       Per HPI, otherwise negative  HENT:       Per HPI, otherwise negative  Respiratory:       Per HPI, otherwise negative  Cardiovascular:       Per HPI, otherwise negative  Gastrointestinal: Negative for vomiting.  Endocrine:       Negative aside from HPI  Genitourinary:       Neg aside from HPI   Musculoskeletal:       Per HPI, otherwise negative  Skin: Negative.   Neurological: Negative for syncope.     Physical Exam Updated Vital Signs BP (!) 151/96   Pulse 82   Temp 97.9 F (36.6 C) (Oral)   Resp 12   Ht 5\' 3"  (1.6 m)   Wt (!) 140.6 kg   SpO2 99%   BMI 54.91 kg/m   Physical Exam Vitals signs and nursing note reviewed.  Constitutional:      General: She is not in acute distress.    Appearance: She is well-developed.     Comments: Large adult female awake and alert speaking clearly  HENT:     Head: Normocephalic and atraumatic.  Eyes:     Conjunctiva/sclera: Conjunctivae normal.  Cardiovascular:     Rate and Rhythm: Normal rate and regular rhythm.  Pulmonary:     Effort: Pulmonary effort is normal. No respiratory distress.     Breath sounds: Normal breath sounds. No stridor.  Chest:     Comments: No tenderness, no deformity Abdominal:     General: There is no distension.   Skin:    General: Skin is warm and dry.  Neurological:     Mental Status: She is alert and oriented to person, place, and time.     Cranial Nerves: No cranial nerve deficit.      ED Treatments / Results  Labs (all labs ordered are listed, but only abnormal results are displayed) Labs Reviewed  BASIC METABOLIC PANEL - Abnormal; Notable for the following components:      Result Value   Creatinine, Ser 1.26 (*)    GFR calc non Af Amer 47 (*)    GFR calc Af Amer 55 (*)    Anion gap 19 (*)    All other components within normal limits  CBC - Abnormal; Notable for the following components:   RBC 5.37 (*)    Hemoglobin 15.9 (*)    HCT 48.9 (*)    All other components within normal limits  TROPONIN I  TROPONIN I  BRAIN NATRIURETIC PEPTIDE    EKG EKG Interpretation  Date/Time:  Tuesday May 11 2018 06:27:19 EDT Ventricular Rate:  98 PR Interval:    QRS Duration: 151 QT Interval:  436 QTC Calculation: 557 R Axis:   -44 Text Interpretation:  Sinus tachycardia Multiple ventricular premature complexes Baseline wander in lead(s) V6 Baseline wander Abnormal ekg Confirmed by Carmin Muskrat [phone]) on 05/11/2018 7:08:06 AM   Radiology Dg Chest 2 View  Result Date: 05/11/2018 CLINICAL DATA:  Chest pain and shortness of breath for 1 day EXAM: CHEST - 2 VIEW COMPARISON:  04/14/2018 FINDINGS: Cardiomegaly and aortic tortuosity. Vascular congestion. No airspace disease, effusion, or  pneumothorax. Spondylosis with bridging osteophytes IMPRESSION: Cardiomegaly and vascular congestion. Electronically Signed   By: Monte Fantasia M.D.   On: 05/11/2018 07:15    Procedures Procedures (including critical care time)  Medications Ordered in ED Medications  fentaNYL (SUBLIMAZE) injection 25 mcg (has no administration in time range)     Initial Impression / Assessment and Plan / ED Course  I have reviewed the triage vital signs and the nursing notes.  Pertinent labs & imaging results  that were available during my care of the patient were reviewed by me and considered in my medical decision making (see chart for details).        9:02 AM Patient in no distress, continues to complain of pain. Initial troponin reassuring, though the patient does have some evidence for mild dehydration, labs are otherwise reassuring. Second troponin pending.  12:51 PM Second troponin unremarkable, BNP unremarkable, patient is awake and alert sitting on the edge of the bed speaking clearly. We discussed all findings, including some suspicion for musculoskeletal etiology given the reassuring labs, EKG, prior cardiac evaluation noted to be unremarkable. With no exertional chest pain, reassuring findings, as above here, the patient will follow up with her primary care physician.  Final Clinical Impressions(s) / ED Diagnoses  Atypical chest pain   Carmin Muskrat, MD 05/11/18 1251

## 2018-05-11 NOTE — ED Triage Notes (Signed)
Pt in with sudden L sided cp that began when she woke this am, radiates to L back. Denies sob, n/v or cough

## 2018-05-13 ENCOUNTER — Ambulatory Visit: Payer: Self-pay

## 2018-05-13 NOTE — Telephone Encounter (Signed)
Ok for phone

## 2018-05-13 NOTE — Telephone Encounter (Signed)
Per triage note pt does not have video capabilities. Would this pt be a good candidate to do a phone encounter with? Please advise on how to proceed.

## 2018-05-13 NOTE — Telephone Encounter (Signed)
Patient called and says that she's been having dizziness when she's walking around and it started yesterday morning, mild dizziness. She says when she sits down, no dizziness, only when she's up moving around. I asked about other symptoms, she denies. I asked about webex capability, she says she doesn't have a computer, laptop, or tablet and her phone doesn't download apps. I called and spoke to Sam, [facility] who says to let the patient know someone will call back with Dr. Gwynn Burly recommendation. I advised the patient, care advice given, she verbalized understanding. [phone].   Reason for Disposition . [1] MILD dizziness (e.g., walking normally) AND [2] has NOT been evaluated by physician for this  (Exception: dizziness caused by heat exposure, sudden standing, or poor fluid intake)  Answer Assessment - Initial Assessment Questions 1. DESCRIPTION: "Describe your dizziness."     Lightheaded 2. LIGHTHEADED: "Do you feel lightheaded?" (e.g., somewhat faint, woozy, weak upon standing)     Yes 3. VERTIGO: "Do you feel like either you or the room is spinning or tilting?" (i.e. vertigo)     No 4. SEVERITY: "How bad is it?"  "Do you feel like you are going to faint?" "Can you stand and walk?"   - MILD - walking normally   - MODERATE - interferes with normal activities (e.g., work, school)    - SEVERE - unable to stand, requires support to walk, feels like passing out now.     Mild 5. ONSET:  "When did the dizziness begin?"     Yesterday morning 6. AGGRAVATING FACTORS: "Does anything make it worse?" (e.g., standing, change in head position)     Moving constantly 7. HEART RATE: "Can you tell me your heart rate?" "How many beats in 15 seconds?"  (Note: not all patients can do this)       N/A 8. CAUSE: "What do you think is causing the dizziness?"     I don't know 9. RECURRENT SYMPTOM: "Have you had dizziness before?" If so, ask: "When was the last time?" "What happened that time?"     Maybe, a while  back 10. OTHER SYMPTOMS: "Do you have any other symptoms?" (e.g., fever, chest pain, vomiting, diarrhea, bleeding)     No 11. PREGNANCY: "Is there any chance you are pregnant?" "When was your last menstrual period?"      No  Protocols used: DIZZINESS [facility]

## 2018-05-14 ENCOUNTER — Telehealth (INDEPENDENT_AMBULATORY_CARE_PROVIDER_SITE_OTHER): Payer: BLUE CROSS/BLUE SHIELD | Admitting: Internal Medicine

## 2018-05-14 DIAGNOSIS — I517 Cardiomegaly: Secondary | ICD-10-CM

## 2018-05-14 DIAGNOSIS — R42 Dizziness and giddiness: Secondary | ICD-10-CM

## 2018-05-14 DIAGNOSIS — E785 Hyperlipidemia, unspecified: Secondary | ICD-10-CM

## 2018-05-14 DIAGNOSIS — R079 Chest pain, unspecified: Secondary | ICD-10-CM

## 2018-05-14 DIAGNOSIS — R739 Hyperglycemia, unspecified: Secondary | ICD-10-CM | POA: Diagnosis not present

## 2018-05-14 DIAGNOSIS — R7302 Impaired glucose tolerance (oral): Secondary | ICD-10-CM

## 2018-05-14 NOTE — Telephone Encounter (Signed)
Pt has set up a phone visit for 3.40pm today. I have put a hold on the schedule for that time.

## 2018-05-14 NOTE — Telephone Encounter (Signed)
Virtual Visit via Telephone Note (visual failed)  I connected with [name] on today at  by a video enabled telemedicine application where the visual failed, and verified that I am speaking with the correct person using two identifiers. Pt is at home, and I am in the office, and no other presons present   I discussed the limitations of evaluation and management by telemedicine and the availability of in person appointments. The patient expressed understanding and agreed to proceed.  History of Present Illness: Here to f/u recent ED visit mar 31 with dizziness and CP; CP found to be non cardiac and felt ok for d/c.  Since then dizziness has much improved in seveirty and frequency, was orthostatic it seemed and better with sitting down. Denies vertigo like sensation.  Had maybe not been drinking as much fluids lately, also was on increased HCT from 12.5 to 25.  Since the visit has been drinking more fluids. Pt denies chest pain, increased sob or doe, wheezing, orthopnea, PND, increased LE swelling, palpitations, or syncope.  Pt denies new neurological symptoms such as new headache, or facial or extremity weakness or numbness   Pt denies polydipsia, polyuria,  Pt states overall good compliance with meds, trying to follow lower cholesterol, diabetic diet, wt overall stable.  Labs in ED with mild AKI, and mild increased Hgb indicating possible low volume.  Pt asks for lipids with labs  Past Medical History:  Diagnosis Date  . Alcohol abuse    abuse- moderate years ago - states only drinks now on weekends -2 to 8 driinks   . Asthma   . COLONIC POLYPS, HX OF 04/05/2010  . DIVERTICULITIS, HX OF 04/05/2010  . DJD (degenerative joint disease)    right knee, mot to severe  . GERD (gastroesophageal reflux disease)    no meds  . Heart murmur    hx of   . Hyperlipidemia   . Hypertension   . Impaired glucose tolerance 12/06/2013  . Obesity (BMI 30-39.9)   . PALPITATIONS, HX OF 09/14/2007   Past  Surgical History:  Procedure Laterality Date  . ABDOMINAL HYSTERECTOMY  age 62   fibroids  . COLONOSCOPY WITH PROPOFOL N/A 07/25/2016   Procedure: COLONOSCOPY WITH PROPOFOL;  Surgeon: Carol Ada, MD;  Location: [facility];  Service: Endoscopy;  Laterality: N/A;  . colonscopy     x 2  . IR RADIOLOGIST EVAL & MGMT  08/12/2016  . IR RADIOLOGIST EVAL & MGMT  08/21/2016  . KNEE ARTHROSCOPY     left   . TOTAL KNEE ARTHROPLASTY  07/29/2011   Procedure: TOTAL KNEE ARTHROPLASTY;  Surgeon: Mauri Pole, MD;  Location: [facility];  Service: Orthopedics;  Laterality: Right;  . TOTAL KNEE ARTHROPLASTY Left 12/14/2012   Procedure: LEFT TOTAL KNEE ARTHROPLASTY;  Surgeon: Mauri Pole, MD;  Location: [facility];  Service: Orthopedics;  Laterality: Left;    reports that she quit smoking about 30 years ago. Her smoking use included cigarettes. She has a 3.50 pack-year smoking history. She has never used smokeless tobacco. She reports current alcohol use. She reports that she does not use drugs. family history includes COPD in her father; Lymphoma in her sister; Stroke in her mother. Allergies  Allergen Reactions  . Morphine Itching  . Shrimp [Shellfish Allergy] Itching and Other (See Comments)    Tongue burns also  . Tramadol Other (See Comments)    Caused confusion  . Latex Rash   Current Outpatient Medications on File Prior  to Visit  Medication Sig Dispense Refill  . azelastine (ASTELIN) 0.1 % nasal spray Place 1 spray into both nostrils 2 (two) times daily. Use in each nostril as directed (Patient not taking: Reported on 05/11/2018) 30 mL 12  . Azelastine-Fluticasone (DYMISTA) 137-50 MCG/ACT SUSP 1 spray each nostril twice per day (Patient not taking: Reported on 05/11/2018) 23 g 11  . Fluticasone-Salmeterol (ADVAIR DISKUS) 250-50 MCG/DOSE AEPB Inhale 1 puff into the lungs 2 (two) times daily. 180 each 3  . hydrochlorothiazide (HYDRODIURIL) 25 MG tablet Take 1 tablet (25 mg total) by mouth daily. (Patient  not taking: Reported on 05/11/2018) 90 tablet 3  . HYDROcodone-acetaminophen (NORCO) 10-325 MG tablet Take 1 tablet by mouth every 8 (eight) hours as needed. (Patient not taking: Reported on 05/11/2018) 30 tablet 0  . HYDROcodone-acetaminophen (NORCO/VICODIN) 5-325 MG tablet Take 1 tablet by mouth every 6 (six) hours as needed for moderate pain. 30 tablet 0  . LIPITOR 10 MG tablet TAKE 1 TABLET BY MOUTH EVERY DAY (Patient taking differently: Take 10 mg by mouth every morning. ) 90 tablet 3  . losartan (COZAAR) 100 MG tablet Take 1 tablet (100 mg total) by mouth daily. (Patient not taking: Reported on 05/11/2018) 90 tablet 3  . losartan-hydrochlorothiazide (HYZAAR) 100-25 MG tablet TAKE 1 TABLET BY MOUTH EVERY DAY (Patient taking differently: Take 1 tablet by mouth daily. ) 90 tablet 1  . meloxicam (MOBIC) 15 MG tablet Take 1 tablet (15 mg total) by mouth daily. (Patient not taking: Reported on 05/11/2018) 30 tablet 2  . potassium chloride SA (K-DUR,KLOR-CON) 20 MEQ tablet Take 1 tablet (20 mEq total) by mouth daily. (Patient taking differently: Take 20 mEq by mouth daily as needed (low potassium). ) 30 tablet 2  . PROAIR HFA 108 (90 Base) MCG/ACT inhaler INHALE 1 TO 2 PUFFS BY MOUTH EVERY 6 HOURS AS NEEDED FOR WHEEZE (Patient taking differently: Inhale 1-2 puffs into the lungs every 6 (six) hours as needed for wheezing or shortness of breath. ) 8.5 Inhaler 5  . triamcinolone cream (KENALOG) 0.1 % APPLY TO AFFECTED AREA TWICE A DAY (Patient taking differently: Apply 1 application topically 2 (two) times daily as needed (rash). ) 30 g 0   No current facility-administered medications on file prior to visit.    Observations/Objective: Unable to observe, satates BP at home 118/87 Lab Results  Component Value Date   WBC 9.3 05/11/2018   HGB 15.9 (H) 05/11/2018   HCT 48.9 (H) 05/11/2018   PLT 214 05/11/2018   GLUCOSE 99 05/11/2018   CHOL 194 01/06/2018   TRIG 94.0 01/06/2018   HDL 67.20 01/06/2018    LDLDIRECT 107.0 02/23/2012   LDLCALC 108 (H) 01/06/2018   ALT 25 01/06/2018   AST 30 01/06/2018   NA 139 05/11/2018   K 3.7 05/11/2018   CL 98 05/11/2018   CREATININE 1.26 (H) 05/11/2018   BUN 17 05/11/2018   CO2 22 05/11/2018   TSH 1.93 01/06/2018   INR 1.11 07/28/2016   HGBA1C 6.0 01/06/2018   Assessment and Plan: Dizziness - I suspect mild low volume at the time, now improved with increased po fluids it seems, doubt vertigo or other neurological cause, CXR did show CMG with vasc congestion which I suspect was not evident clinically, so will check Echo  CP - resolved, cont same tx, declines further stress testing at this time  AKI mild, for f/u BMP early next week  Hyperglycemia  - for A1c with labs  HLD -  for lipids with labs per pt reqeust, cont lower chol diet  Follow Up Instructions: You will be contacted regarding the referral for: Echocardiogram  Please continue all other medications as before, and refills have been done if requested.  Please have the pharmacy call with any other refills you may need.  Please continue your efforts at being more active, low cholesterol diet, and weight control.  Please keep your appointments with your specialists as you may have planned  Please go to the LAB in the Basement (turn left off the elevator) for the tests to be done next week as planned   I discussed the assessment and treatment plan with the patient. The patient was provided an opportunity to ask questions and all were answered. The patient agreed with the plan and demonstrated an understanding of the instructions.   The patient was advised to call back or seek an in-person evaluation if the symptoms worsen or if the condition fails to improve as anticipated.  I provided  22 minutes of non-face-to-face time during this encounter.   Cathlean Cower, MD

## 2018-05-26 ENCOUNTER — Other Ambulatory Visit (INDEPENDENT_AMBULATORY_CARE_PROVIDER_SITE_OTHER): Payer: BLUE CROSS/BLUE SHIELD

## 2018-05-26 DIAGNOSIS — R42 Dizziness and giddiness: Secondary | ICD-10-CM | POA: Diagnosis not present

## 2018-05-26 DIAGNOSIS — R739 Hyperglycemia, unspecified: Secondary | ICD-10-CM

## 2018-05-26 LAB — HEPATIC FUNCTION PANEL
ALT: 32 U/L (ref 0–35)
AST: 32 U/L (ref 0–37)
Albumin: 3.9 g/dL (ref 3.5–5.2)
Alkaline Phosphatase: 94 U/L (ref 39–117)
Bilirubin, Direct: 0.1 mg/dL (ref 0.0–0.3)
Total Bilirubin: 0.4 mg/dL (ref 0.2–1.2)
Total Protein: 7.6 g/dL (ref 6.0–8.3)

## 2018-05-26 LAB — BASIC METABOLIC PANEL
BUN: 23 mg/dL (ref 6–23)
CO2: 30 mEq/L (ref 19–32)
Calcium: 9.5 mg/dL (ref 8.4–10.5)
Chloride: 102 mEq/L (ref 96–112)
Creatinine, Ser: 1.17 mg/dL (ref 0.40–1.20)
GFR: 57.66 mL/min — ABNORMAL LOW (ref >60.00)
Glucose, Bld: 100 mg/dL — ABNORMAL HIGH (ref 70–99)
Potassium: 3.7 mEq/L (ref 3.5–5.1)
Sodium: 140 mEq/L (ref 135–145)

## 2018-05-26 LAB — LIPID PANEL
Cholesterol: 173 mg/dL (ref 0–200)
HDL: 59.1 mg/dL (ref >39.00)
LDL Cholesterol: 96 mg/dL (ref 0–99)
NonHDL: 114.25
Total CHOL/HDL Ratio: 3
Triglycerides: 89 mg/dL (ref 0.0–149.0)
VLDL: 17.8 mg/dL (ref 0.0–40.0)

## 2018-05-26 LAB — CBC WITH DIFFERENTIAL/PLATELET
Basophils Absolute: 0.1 10*3/uL (ref 0.0–0.1)
Basophils Relative: 0.8 % (ref 0.0–3.0)
Eosinophils Absolute: 0.2 10*3/uL (ref 0.0–0.7)
Eosinophils Relative: 1.9 % (ref 0.0–5.0)
HCT: 44.3 % (ref 36.0–46.0)
Hemoglobin: 14.6 g/dL (ref 12.0–15.0)
Lymphocytes Relative: 19.2 % (ref 12.0–46.0)
Lymphs Abs: 2.2 10*3/uL (ref 0.7–4.0)
MCHC: 33 g/dL (ref 30.0–36.0)
MCV: 91.2 fl (ref 78.0–100.0)
Monocytes Absolute: 0.9 10*3/uL (ref 0.1–1.0)
Monocytes Relative: 7.8 % (ref 3.0–12.0)
Neutro Abs: 8.2 10*3/uL — ABNORMAL HIGH (ref 1.4–7.7)
Neutrophils Relative %: 70.3 % (ref 43.0–77.0)
Platelets: 255 10*3/uL (ref 150.0–400.0)
RBC: 4.86 Mil/uL (ref 3.87–5.11)
RDW: 14.3 % (ref 11.5–15.5)
WBC: 11.7 10*3/uL — ABNORMAL HIGH (ref 4.0–10.5)

## 2018-05-26 LAB — HEMOGLOBIN A1C: Hgb A1c MFr Bld: 5.8 % (ref 4.6–6.5)

## 2018-06-08 ENCOUNTER — Emergency Department (HOSPITAL_COMMUNITY)
Admission: EM | Admit: 2018-06-08 | Discharge: 2018-06-09 | Disposition: A | Discharge status: Home / Self Care | Payer: BLUE CROSS/BLUE SHIELD | Attending: Emergency Medicine | Admitting: Emergency Medicine

## 2018-06-08 ENCOUNTER — Encounter (HOSPITAL_COMMUNITY): Payer: Self-pay | Admitting: Emergency Medicine

## 2018-06-08 ENCOUNTER — Other Ambulatory Visit: Payer: Self-pay

## 2018-06-08 DIAGNOSIS — J45909 Unspecified asthma, uncomplicated: Secondary | ICD-10-CM | POA: Diagnosis not present

## 2018-06-08 DIAGNOSIS — R102 Pelvic and perineal pain: Secondary | ICD-10-CM | POA: Insufficient documentation

## 2018-06-08 DIAGNOSIS — Z79899 Other long term (current) drug therapy: Secondary | ICD-10-CM | POA: Insufficient documentation

## 2018-06-08 DIAGNOSIS — Z87891 Personal history of nicotine dependence: Secondary | ICD-10-CM | POA: Diagnosis not present

## 2018-06-08 DIAGNOSIS — N939 Abnormal uterine and vaginal bleeding, unspecified: Secondary | ICD-10-CM | POA: Diagnosis not present

## 2018-06-08 DIAGNOSIS — N200 Calculus of kidney: Secondary | ICD-10-CM | POA: Insufficient documentation

## 2018-06-08 DIAGNOSIS — Z91040 Latex allergy status: Secondary | ICD-10-CM | POA: Insufficient documentation

## 2018-06-08 DIAGNOSIS — Z96651 Presence of right artificial knee joint: Secondary | ICD-10-CM | POA: Diagnosis not present

## 2018-06-08 DIAGNOSIS — I10 Essential (primary) hypertension: Secondary | ICD-10-CM | POA: Diagnosis not present

## 2018-06-08 DIAGNOSIS — E860 Dehydration: Secondary | ICD-10-CM | POA: Diagnosis not present

## 2018-06-08 DIAGNOSIS — Z96652 Presence of left artificial knee joint: Secondary | ICD-10-CM | POA: Diagnosis not present

## 2018-06-08 DIAGNOSIS — K760 Fatty (change of) liver, not elsewhere classified: Secondary | ICD-10-CM | POA: Diagnosis not present

## 2018-06-08 DIAGNOSIS — N179 Acute kidney failure, unspecified: Secondary | ICD-10-CM | POA: Diagnosis not present

## 2018-06-08 LAB — COMPREHENSIVE METABOLIC PANEL
ALT: 53 U/L — ABNORMAL HIGH (ref 0–44)
AST: 60 U/L — ABNORMAL HIGH (ref 15–41)
Albumin: 3.9 g/dL (ref 3.5–5.0)
Alkaline Phosphatase: 107 U/L (ref 38–126)
Anion gap: 18 — ABNORMAL HIGH (ref 5–15)
BUN: 22 mg/dL — ABNORMAL HIGH (ref 6–20)
CO2: 24 mmol/L (ref 22–32)
Calcium: 9.9 mg/dL (ref 8.9–10.3)
Chloride: 92 mmol/L — ABNORMAL LOW (ref 98–111)
Creatinine, Ser: 1.39 mg/dL — ABNORMAL HIGH (ref 0.44–1.00)
GFR calc Af Amer: 49 mL/min — ABNORMAL LOW (ref >60)
GFR calc non Af Amer: 42 mL/min — ABNORMAL LOW (ref >60)
Glucose, Bld: 98 mg/dL (ref 70–99)
Potassium: 3.7 mmol/L (ref 3.5–5.1)
Sodium: 134 mmol/L — ABNORMAL LOW (ref 135–145)
Total Bilirubin: 1.1 mg/dL (ref 0.3–1.2)
Total Protein: 8 g/dL (ref 6.5–8.1)

## 2018-06-08 LAB — URINALYSIS, ROUTINE W REFLEX MICROSCOPIC
Bilirubin Urine: NEGATIVE
Glucose, UA: 50 mg/dL — AB
Ketones, ur: 5 mg/dL — AB
Nitrite: NEGATIVE
Protein, ur: 30 mg/dL — AB
Specific Gravity, Urine: 1.028 (ref 1.005–1.030)
pH: 5 (ref 5.0–8.0)

## 2018-06-08 LAB — CBC
HCT: 46.8 % — ABNORMAL HIGH (ref 36.0–46.0)
Hemoglobin: 15.7 g/dL — ABNORMAL HIGH (ref 12.0–15.0)
MCH: 29.6 pg (ref 26.0–34.0)
MCHC: 33.5 g/dL (ref 30.0–36.0)
MCV: 88.3 fL (ref 80.0–100.0)
Platelets: 224 10*3/uL (ref 150–400)
RBC: 5.3 MIL/uL — ABNORMAL HIGH (ref 3.87–5.11)
RDW: 13.9 % (ref 11.5–15.5)
WBC: 11.6 10*3/uL — ABNORMAL HIGH (ref 4.0–10.5)
nRBC: 0.2 % (ref 0.0–0.2)

## 2018-06-08 LAB — I-STAT BETA HCG BLOOD, ED (MC, WL, AP ONLY): I-stat hCG, quantitative: 7.4 m[IU]/mL — ABNORMAL HIGH (ref <5)

## 2018-06-08 LAB — LIPASE, BLOOD: Lipase: 31 U/L (ref 11–51)

## 2018-06-08 MED ORDER — SODIUM CHLORIDE 0.9% FLUSH
3.0000 mL | Freq: Once | INTRAVENOUS | Status: AC
  Administered 2018-06-09: 3 mL via INTRAVENOUS

## 2018-06-08 NOTE — ED Provider Notes (Signed)
[facility] EMERGENCY DEPARTMENT Provider Note   CSN: [id number] Arrival date & time: 06/08/18  2106    History   Chief Complaint Chief Complaint  Patient presents with   Abdominal Pain    HPI [name] is a 58 y.o. female with a history of colo-vesicle fistula, alcohol use disorder, HTN, HLD, obesity, asthma, and GERD who presents to the emergency department with a chief complaint of vaginal bleeding.  The patient reports vaginal bleeding onset today.  She reports that she has been having intermittent vaginal bleeding "for years" due to a colovesical fistula.  She reports associated worsening vaginal pain over the last 3 days.  She reports the pain began after having sexual intercourse with her husband. She notes that her husband has a habit of having his penis between his legs "hanging in the toilet" while having a BM, which she states that she observed happening 3 days ago.  She denies vaginal itching or discharge.  She also reports that she is here for a bilateral pressure-like headache that began earlier today.  She is requesting Percocet for her headache.  She denies associated weakness, numbness, visual changes.  She reports that she has been feeling more nauseated and had an episode of retching with clear liquid earlier today.  She also reports that she has been having more frequent, loose bowel movements over the last 6 days.   She reports associated lightheadedness with standing.  She also notes that her urine has been more dark and amber in color. She denies hematemesis, melena, hematochezia, abdominal pain, back pain, dizziness, visual changes, neck pain or stiffness, dysuria, hematuria, fever, chills, cough, shortness of breath, or chest pain.   She also reports an increase in alcohol use over the last 4 weeks.  She states that she used to just drink on weekends.  She reports that her last drink was 3 nights ago when she had "a couple of strawberry  daiquiris".  She reports that she has had some intermittent tremors over the last day or 2.  She also reports that she had an episode of feeling tremulous last week, but cannot recall if it came after abstaining from alcohol as she reports that she drinks most days last week.  She is a non-smoker.  She denies other IV or recreational drug use.  She denies a history of DTs or seizures when abstaining from alcohol.     The history is provided by the patient. No language interpreter was used.    Past Medical History:  Diagnosis Date   Alcohol abuse    abuse- moderate years ago - states only drinks now on weekends -2 to 8 driinks    Asthma    COLONIC POLYPS, HX OF 04/05/2010   DIVERTICULITIS, HX OF 04/05/2010   DJD (degenerative joint disease)    right knee, mot to severe   GERD (gastroesophageal reflux disease)    no meds   Heart murmur    hx of    Hyperlipidemia    Hypertension    Impaired glucose tolerance 12/06/2013   Obesity (BMI 30-39.9)    PALPITATIONS, HX OF 09/14/2007    Patient Active Problem List   Diagnosis Date Noted   Leukocytosis 09/22/2017   Abnormal urine color 09/22/2017   Rash 09/22/2017   Sepsis ([place]) 09/12/2016   Colovesical fistula 09/12/2016   Hypertension 07/28/2016   Alcohol abuse 07/28/2016   Obesity (BMI 30-39.9) 07/28/2016   Abscess of bladder 07/28/2016   Colonic  diverticulum 07/28/2016   Acute hypokalemia 07/28/2016   Hyperlipidemia 07/10/2016   Mass of left side of neck 07/10/2016   Head and neck lymphadenopathy 07/10/2016   Acute sinus infection 01/25/2016   Eustachian tube disorder 01/25/2016   Asthma exacerbation 05/18/2015   Peripheral edema 03/16/2015   Asthma 03/16/2015   Right shoulder pain 03/16/2015   Hypokalemia 10/04/2014   Upper airway cough syndrome 10/03/2014   Severe obesity (BMI >= 40) (HCC) 10/03/2014   Lower back pain 05/25/2014   Bilateral shoulder pain 05/25/2014   Chest pain  04/14/2014   Allergic rhinitis 12/06/2013   Impaired glucose tolerance 12/06/2013   Expected blood loss anemia 12/16/2012   Morbid obesity ([place]) 12/15/2012   S/P left TKA 12/14/2012   Goiter 11/26/2012   Preop exam for internal medicine 11/26/2012   Diarrhea 02/25/2012   Vaginal bleeding 04/30/2011   Colon polyps 02/10/2011   Eczema 02/10/2011   Depression 02/10/2011   Preventative health care 09/27/2010   Anxiety state 04/05/2010   DIVERTICULITIS, HX OF 04/05/2010   PALPITATIONS, HX OF 09/14/2007   Obesity 04/24/2007   VOCAL CORD DISORDER 04/24/2007   Extrinsic asthma 04/24/2007   GERD 04/24/2007    Past Surgical History:  Procedure Laterality Date   ABDOMINAL HYSTERECTOMY  age 89   fibroids   COLONOSCOPY WITH PROPOFOL N/A 07/25/2016   Procedure: COLONOSCOPY WITH PROPOFOL;  Surgeon: Carol Ada, MD;  Location: [facility];  Service: Endoscopy;  Laterality: N/A;   colonscopy     x 2   IR RADIOLOGIST EVAL & MGMT  08/12/2016   IR RADIOLOGIST EVAL & MGMT  08/21/2016   KNEE ARTHROSCOPY     left    TOTAL KNEE ARTHROPLASTY  07/29/2011   Procedure: TOTAL KNEE ARTHROPLASTY;  Surgeon: Mauri Pole, MD;  Location: [facility];  Service: Orthopedics;  Laterality: Right;   TOTAL KNEE ARTHROPLASTY Left 12/14/2012   Procedure: LEFT TOTAL KNEE ARTHROPLASTY;  Surgeon: Mauri Pole, MD;  Location: [facility];  Service: Orthopedics;  Laterality: Left;     OB History    Gravida  1   Para  1   Term      Preterm      AB      Living  1     SAB      TAB      Ectopic      Multiple      Live Births               Home Medications    Prior to Admission medications   Medication Sig Start Date End Date Taking? Authorizing Provider  azelastine (ASTELIN) 0.1 % nasal spray Place 1 spray into both nostrils 2 (two) times daily. Use in each nostril as directed Patient not taking: Reported on 05/11/2018 01/06/18   Biagio Borg, MD  Azelastine-Fluticasone  [facility]) [phone] MCG/ACT SUSP 1 spray each nostril twice per day Patient not taking: Reported on 05/11/2018 01/06/18   Biagio Borg, MD  cephALEXin (KEFLEX) 500 MG capsule Take 1 capsule (500 mg total) by mouth 2 (two) times daily for 5 days. 06/09/18 06/14/18  Tyreanna Bisesi A, PA-C  Fluticasone-Salmeterol (ADVAIR DISKUS) 250-50 MCG/DOSE AEPB Inhale 1 puff into the lungs 2 (two) times daily. 09/22/17   Biagio Borg, MD  hydrochlorothiazide (HYDRODIURIL) 25 MG tablet Take 1 tablet (25 mg total) by mouth daily. Patient not taking: Reported on 05/11/2018 01/06/18   Biagio Borg, MD  HYDROcodone-acetaminophen [facility]) 10-325 MG tablet  Take 1 tablet by mouth every 8 (eight) hours as needed. Patient not taking: Reported on 05/11/2018 02/19/18   Wallene Huh, DPM  HYDROcodone-acetaminophen (NORCO/VICODIN) 5-325 MG tablet Take 1 tablet by mouth every 6 (six) hours as needed for moderate pain. 04/29/18   Regal, Tamala Fothergill, DPM  LIPITOR 10 MG tablet TAKE 1 TABLET BY MOUTH EVERY DAY Patient taking differently: Take 10 mg by mouth every morning.  02/15/18   Biagio Borg, MD  losartan (COZAAR) 100 MG tablet Take 1 tablet (100 mg total) by mouth daily. Patient not taking: Reported on 05/11/2018 01/06/18   Biagio Borg, MD  losartan-hydrochlorothiazide (HYZAAR) 100-25 MG tablet TAKE 1 TABLET BY MOUTH EVERY DAY Patient taking differently: Take 1 tablet by mouth daily.  04/14/18   Biagio Borg, MD  meloxicam (MOBIC) 15 MG tablet Take 1 tablet (15 mg total) by mouth daily. Patient not taking: Reported on 05/11/2018 02/19/18   Wallene Huh, DPM  potassium chloride SA (K-DUR,KLOR-CON) 20 MEQ tablet Take 1 tablet (20 mEq total) by mouth daily. Patient taking differently: Take 20 mEq by mouth daily as needed (low potassium).  09/22/17   Biagio Borg, MD  PROAIR HFA 108 [phone] Base) MCG/ACT inhaler INHALE 1 TO 2 PUFFS BY MOUTH EVERY 6 HOURS AS NEEDED FOR WHEEZE Patient taking differently: Inhale 1-2 puffs into the lungs every 6  (six) hours as needed for wheezing or shortness of breath.  02/19/18   Biagio Borg, MD  triamcinolone cream (KENALOG) 0.1 % APPLY TO AFFECTED AREA TWICE A DAY Patient taking differently: Apply 1 application topically 2 (two) times daily as needed (rash).  12/09/17   Biagio Borg, MD    Family History Family History  Problem Relation Age of Onset   Stroke Mother    COPD Father    Lymphoma Sister     Social History Social History   Tobacco Use   Smoking status: Former Smoker    Packs/day: 0.50    Years: 7.00    Pack years: 3.50    Types: Cigarettes    Last attempt to quit: 02/11/1988    Years since quitting: 30.3   Smokeless tobacco: Never Used  Substance Use Topics   Alcohol use: Yes    Comment: Occas   Drug use: No    Comment: smoked marajuania x 10 years.  Quit in 1985.     Allergies   Morphine; Shrimp [shellfish allergy]; Tramadol; and Latex   Review of Systems Review of Systems  Constitutional: Negative for activity change, chills and fever.  HENT: Negative for congestion and sore throat.   Eyes: Negative for visual disturbance.  Respiratory: Negative for apnea, choking, shortness of breath and wheezing.   Cardiovascular: Negative for chest pain and palpitations.  Gastrointestinal: Positive for nausea. Negative for abdominal pain, blood in stool, constipation, rectal pain and vomiting.  Endocrine: Negative for polyuria.  Genitourinary: Positive for vaginal bleeding and vaginal pain. Negative for dysuria, frequency, urgency and vaginal discharge.  Musculoskeletal: Negative for back pain.  Skin: Negative for rash.  Allergic/Immunologic: Negative for immunocompromised state.  Neurological: Positive for light-headedness and headaches. Negative for dizziness, syncope, speech difficulty and weakness.  Psychiatric/Behavioral: Negative for confusion.     Physical Exam Updated Vital Signs BP 104/68    Pulse (!) 101    Temp 99.6 F (37.6 C) (Rectal)    Resp  16    Ht 5\' 3"  (1.6 m)    Wt (!) 141 kg  SpO2 96%    BMI 55.06 kg/m   Physical Exam Vitals signs and nursing note reviewed.  Constitutional:      General: She is not in acute distress. HENT:     Head: Normocephalic.     Nose: Nose normal. No congestion or rhinorrhea.  Eyes:     General: No scleral icterus.    Extraocular Movements: Extraocular movements intact.     Conjunctiva/sclera: Conjunctivae normal.     Pupils: Pupils are equal, round, and reactive to light.  Neck:     Musculoskeletal: Normal range of motion and neck supple. No neck rigidity.     Vascular: No carotid bruit.  Cardiovascular:     Rate and Rhythm: Regular rhythm. Tachycardia present.     Heart sounds: No murmur. No friction rub. No gallop.   Pulmonary:     Effort: Pulmonary effort is normal. No respiratory distress.     Breath sounds: No stridor. No wheezing, rhonchi or rales.  Chest:     Chest wall: No tenderness.  Abdominal:     General: There is no distension.     Palpations: Abdomen is soft. There is no mass.     Tenderness: There is abdominal tenderness. There is no right CVA tenderness, left CVA tenderness, guarding or rebound.     Hernia: No hernia is present.     Comments: Minimal discomfort in suprapubic and bilateral pelvic regions without rebound or guarding.  Abdomen is obese.  Soft, nondistended.  No CVA tenderness bilaterally.  Upper abdomen is unremarkable.  Normoactive bowel sounds.  Genitourinary:    Comments: Chaperoned exam.  There is a small amount of bleeding in the vaginal vault.  Minimal amount of yellowish discharge.  No obvious wounds or lacerations.  She is tender to palpation to the posterior vaginal wall.  No purulence or fecal matter is noted. Lymphadenopathy:     Cervical: No cervical adenopathy.  Skin:    General: Skin is warm.     Capillary Refill: Capillary refill takes less than 2 seconds.     Coloration: Skin is not jaundiced.     Findings: No bruising or rash.    Neurological:     General: No focal deficit present.     Mental Status: She is alert.  Psychiatric:        Behavior: Behavior normal.      ED Treatments / Results  Labs (all labs ordered are listed, but only abnormal results are displayed) Labs Reviewed  WET PREP, GENITAL - Abnormal; Notable for the following components:      Result Value   WBC, Wet Prep HPF POC MANY (*)    All other components within normal limits  COMPREHENSIVE METABOLIC PANEL - Abnormal; Notable for the following components:   Sodium 134 (*)    Chloride 92 (*)    BUN 22 (*)    Creatinine, Ser 1.39 (*)    AST 60 (*)    ALT 53 (*)    GFR calc non Af Amer 42 (*)    GFR calc Af Amer 49 (*)    Anion gap 18 (*)    All other components within normal limits  CBC - Abnormal; Notable for the following components:   WBC 11.6 (*)    RBC 5.30 (*)    Hemoglobin 15.7 (*)    HCT 46.8 (*)    All other components within normal limits  URINALYSIS, ROUTINE W REFLEX MICROSCOPIC - Abnormal; Notable for the following components:  Color, Urine AMBER (*)    APPearance HAZY (*)    Glucose, UA 50 (*)    Hgb urine dipstick LARGE (*)    Ketones, ur 5 (*)    Protein, ur 30 (*)    Leukocytes,Ua LARGE (*)    Bacteria, UA RARE (*)    All other components within normal limits  I-STAT BETA HCG BLOOD, ED ([facility], [facility], [facility] ONLY) - Abnormal; Notable for the following components:   I-stat hCG, quantitative 7.4 (*)    All other components within normal limits  URINE CULTURE  LIPASE, BLOOD  GC/CHLAMYDIA PROBE AMP ([place]) NOT AT [facility]    EKG None  Radiology Ct Abdomen Pelvis W Contrast  Result Date: 06/09/2018 CLINICAL DATA:  58 year old female with increasing vaginal pain and bleeding. History of colovaginal fistula from the sigmoid colon to the vaginal cuff. EXAM: CT ABDOMEN AND PELVIS WITH CONTRAST TECHNIQUE: Multidetector CT imaging of the abdomen and pelvis was performed using the standard protocol following bolus  administration of intravenous contrast. CONTRAST:  59mL OMNIPAQUE IOHEXOL 300 MG/ML  SOLN COMPARISON:  Pelvis MRI 09/11/2017 and earlier. CT Abdomen and Pelvis 03/13/2017. FINDINGS: Lower chest: Stable cardiomegaly. No pericardial or pleural effusion. Partially visible mild peribronchial and peripheral ground-glass opacity in the right lower lobe is new since 2019. Stable left lung base. Hepatobiliary: Progressed hepatic steatosis since 2019. Chronic benign liver hemangiomas, the largest in the right lobe is stable measuring about 3.5 centimeters on series 3, image 11. negative gallbladder. Pancreas: Negative. Spleen: Negative. Adrenals/Urinary Tract: Normal adrenal glands. Bilateral renal enhancement is symmetric. 2-3 millimeter left lower pole renal calculi are stable. Progressed nephrograms but no renal contrast excretion on the delayed images. Decompressed ureters to the bladder. Diminutive urinary bladder. Dorsal bladder wall is inseparable from the chronic fistula tract further detailed below. Stomach/Bowel: Negative rectum. Unchanged appearance of the sigmoid colon since 2019, with colovaginal fistula tract further described below. Chronic diverticulosis from the sigmoid through to the hepatic flexure. No active inflammation identified. Negative right colon and appendix. Negative terminal ileum. No dilated small bowel.  Decompressed stomach and duodenum. No free air, free fluid. Vascular/Lymphatic: Aortoiliac calcified atherosclerosis. Major arterial structures are patent. Portal venous system is patent. No lymphadenopathy. Reproductive: Continued band of soft tissue thickening tracking from the vaginal cuff cephalad and to the left terminating along the left pelvic sidewall and inseparable from both the sigmoid colon and dorsal urinary bladder. This appears unchanged since the 2019 MRI. No gas within the vaginal fornix. No abscess by CT. Surgically absent uterus. The left ovary might also be inseparable from  the fistula tract, unchanged. Negative right ovary. Other: No pelvic free fluid. Musculoskeletal: Flowing osteophytes in the lower thoracic spine with ankylosis. Multilevel lumbar vacuum disc. Multilevel lumbar facet arthropathy. No acute osseous abnormality identified. IMPRESSION: 1. Stable imaging appearance of the chronic colovaginal/colovesical fistula since 2019. 2. No inflammatory process in the abdomen or pelvis. Small nonspecific areas of ground-glass opacity at the right lung base are new but nonspecific. Query symptoms of respiratory infection. 3. No renal contrast excretion occurring on the delayed images. Query renal insufficiency. 4. Progressed hepatic steatosis since 2019. Chronic large bowel diverticulosis. Chronic left nephrolithiasis. Electronically Signed   By: Genevie Ann M.D.   On: 06/09/2018 02:20    Procedures Procedures (including critical care time)  Medications Ordered in ED Medications  sodium chloride flush (NS) 0.9 % injection 3 mL (3 mLs Intravenous Given 06/09/18 0420)  sodium chloride 0.9 % bolus 1,000  mL (0 mLs Intravenous Stopped 06/09/18 0420)  prochlorperazine (COMPAZINE) injection 10 mg (10 mg Intravenous Given 06/09/18 0052)  diphenhydrAMINE (BENADRYL) injection 25 mg (25 mg Intravenous Given 06/09/18 0045)  LORazepam (ATIVAN) injection 1 mg (1 mg Intravenous Given 06/09/18 0149)  iohexol (OMNIPAQUE) 300 MG/ML solution 80 mL (80 mLs Intravenous Contrast Given 06/09/18 0144)  sodium chloride 0.9 % bolus 1,000 mL (0 mLs Intravenous Stopped 06/09/18 0518)     Initial Impression / Assessment and Plan / ED Course  I have reviewed the triage vital signs and the nursing notes.  Pertinent labs & imaging results that were available during my care of the patient were reviewed by me and considered in my medical decision making (see chart for details).        58 year old female with a history of colo-vesicle fistula, alcohol use disorder, HTN, HLD, obesity, asthma, and GERD  presenting with headache and vaginal bleeding.  On arrival, she is tachycardic in the 110s to 120s.  She is minimally hypertensive, but has no tachycardia.  Oral temp is 98.4.  Will order rectal temp given that tachycardia could be secondary to infection since the patient has a history of colovesical fistula and bladder abscess.  Patient's labs are also notable for creatinine of 1.39, up from 1.17 on 4/15.  Her CBC also appears hemoconcentrated.  Tachycardia could be secondary to dehydration.  Dehydration could be propagated by recent alcohol intake.  Given that the patient endorses recurrent episodes of tremors over the last few weeks, we will also order a CIWA score since patient's last drink was approximately 3 nights ago and now she is tachycardic.  Urinalysis is concerning for infection.  However, with known fistula, I am uncertain if this is coming from the bladder or from the vaginal region.  Will perform pelvic exam and check for infection.  Orthostatic vital signs have also been ordered, and I will plan to fluid reassess of late the patient and treat her headache with Compazine and Benadryl.  Lesion, patient continues to endorse a headache.  She continues to be tachycardic in the 120s.  Will give a second fluid bolus.  CIWA score is low.  He was given 1 dose of Ativan given tachycardia, but had no improvement in her headache or heart rate.  There could be a small component of this that is secondary to alcohol withdrawal as she has not had an alcoholic beverage in approximately 72 hours, but I suspect tachycardia secondary to dehydration.  Given her history of bladder abscess and pain on pelvic exam, the patient was discussed with Dr. Christy Gentles and CT abdomen pelvis has been ordered.  No evidence of acute infectious processes on CT exam.  Although she is not having urinary symptoms, will treat with Keflex given her urinalysis today.  After second IV fluid bolus, tachycardia has resolved and heart  rate is now in the 90s.  She reports significant improvement in her headache.  She is hemodynamically stable and in no acute distress.  She has been advised to follow-up with primary care regarding her symptoms.  We have also discussed her daily alcohol intake.  All questions have been answered. Safe discharge home with outpatient follow-up at this time.  Final Clinical Impressions(s) / ED Diagnoses   Final diagnoses:  Acute kidney injury ([place])  Vaginal bleeding    ED Discharge Orders         Ordered    cephALEXin (KEFLEX) 500 MG capsule  2 times daily  06/09/18 Felton, Kamoria Lucien A, PA-C 06/09/18 [id number]    Ripley Fraise, MD 06/09/18 1818

## 2018-06-08 NOTE — ED Notes (Signed)
Pt reports vaginal bleeding along with pain over her bladder. Pt reports having an abscess and worries this is the cause of the bleeding.

## 2018-06-08 NOTE — ED Triage Notes (Signed)
Pt reports left sided lower abd pains for the past 2 days. Pt also endorses a headache that just started today.

## 2018-06-09 ENCOUNTER — Encounter (HOSPITAL_COMMUNITY): Payer: Self-pay | Admitting: Radiology

## 2018-06-09 ENCOUNTER — Emergency Department (HOSPITAL_COMMUNITY): Payer: BLUE CROSS/BLUE SHIELD

## 2018-06-09 DIAGNOSIS — K760 Fatty (change of) liver, not elsewhere classified: Secondary | ICD-10-CM | POA: Diagnosis not present

## 2018-06-09 LAB — GC/CHLAMYDIA PROBE AMP (~~LOC~~) NOT AT ARMC
Chlamydia: NEGATIVE
Neisseria Gonorrhea: NEGATIVE

## 2018-06-09 LAB — WET PREP, GENITAL
Clue Cells Wet Prep HPF POC: NONE SEEN
Sperm: NONE SEEN
Trich, Wet Prep: NONE SEEN
Yeast Wet Prep HPF POC: NONE SEEN

## 2018-06-09 MED ORDER — PROCHLORPERAZINE EDISYLATE 10 MG/2ML IJ SOLN
10.0000 mg | Freq: Once | INTRAMUSCULAR | Status: AC
  Administered 2018-06-09: 10 mg via INTRAVENOUS
  Filled 2018-06-09: qty 2

## 2018-06-09 MED ORDER — IOHEXOL 300 MG/ML  SOLN
80.0000 mL | Freq: Once | INTRAMUSCULAR | Status: AC | PRN
  Administered 2018-06-09: 80 mL via INTRAVENOUS

## 2018-06-09 MED ORDER — DIPHENHYDRAMINE HCL 50 MG/ML IJ SOLN
25.0000 mg | Freq: Once | INTRAMUSCULAR | Status: AC
  Administered 2018-06-09: 25 mg via INTRAVENOUS
  Filled 2018-06-09: qty 1

## 2018-06-09 MED ORDER — SODIUM CHLORIDE 0.9 % IV BOLUS
1000.0000 mL | Freq: Once | INTRAVENOUS | Status: AC
  Administered 2018-06-09: 1000 mL via INTRAVENOUS

## 2018-06-09 MED ORDER — LORAZEPAM 2 MG/ML IJ SOLN
1.0000 mg | Freq: Once | INTRAMUSCULAR | Status: AC
  Administered 2018-06-09: 1 mg via INTRAVENOUS
  Filled 2018-06-09: qty 1

## 2018-06-09 MED ORDER — CEPHALEXIN 500 MG PO CAPS: 500.0000 mg | ORAL_CAPSULE | Freq: Two times a day (BID) | ORAL | 0 refills | Status: AC

## 2018-06-09 NOTE — ED Notes (Signed)
Patient verbalizes understanding of discharge instructions. Opportunity for questioning and answers were provided. Armband removed by staff, pt discharged from ED in wheelchair.  

## 2018-06-09 NOTE — Discharge Instructions (Addendum)
Thank you for allowing me to care for you today in the Emergency Department.   Take 1 tablet of Keflex 2 times daily for the next 5 days.  Call to schedule follow-up appointment with your primary care provider.  Make sure to discuss if you continue to have vaginal bleeding with them.  It is important to drink plenty of fluids.  Try to drink at least 64 ounces of fluids by mouth daily.  If you drink alcohol, this can make you more dehydrated.  Try to significantly cut back on how much alcohol you are drinking on a daily or weekly basis.  Sometimes if you have been drinking more frequently and try to cut back, you have have some symptoms associated from withdrawal, even if you don't drink daily. These can include headache, nausea, vomiting, tremors etc.   Return to the emergency department if you develop a high fever, severe chest pain, shortness of breath, worsening vaginal bleeding, if you pass out, or develop other new, concerning symptoms.

## 2018-06-10 ENCOUNTER — Telehealth: Payer: Self-pay | Admitting: Podiatry

## 2018-06-10 ENCOUNTER — Other Ambulatory Visit: Payer: Self-pay | Admitting: Internal Medicine

## 2018-06-10 NOTE — Telephone Encounter (Signed)
Pt wanted a refill on her medication, she didn't want to come to the office being that she has asthma. Please call patient back

## 2018-06-10 NOTE — Telephone Encounter (Signed)
I called pt and explained that Dr. Paulla Dolly had wanted to see her again if she was continuing to have painful symptoms, and that I understood her concern re her asthma, and offered an e-visit. Pt states she would like to have an e-visit.

## 2018-06-11 LAB — URINE CULTURE

## 2018-06-14 ENCOUNTER — Other Ambulatory Visit: Payer: Self-pay

## 2018-06-14 ENCOUNTER — Ambulatory Visit (INDEPENDENT_AMBULATORY_CARE_PROVIDER_SITE_OTHER): Payer: BLUE CROSS/BLUE SHIELD | Admitting: Podiatry

## 2018-06-14 ENCOUNTER — Encounter: Payer: Self-pay | Admitting: Podiatry

## 2018-06-14 ENCOUNTER — Ambulatory Visit (INDEPENDENT_AMBULATORY_CARE_PROVIDER_SITE_OTHER): Payer: BLUE CROSS/BLUE SHIELD

## 2018-06-14 VITALS — Temp 98.1°F

## 2018-06-14 DIAGNOSIS — M76821 Posterior tibial tendinitis, right leg: Secondary | ICD-10-CM | POA: Diagnosis not present

## 2018-06-14 DIAGNOSIS — M779 Enthesopathy, unspecified: Secondary | ICD-10-CM | POA: Diagnosis not present

## 2018-06-14 DIAGNOSIS — M722 Plantar fascial fibromatosis: Secondary | ICD-10-CM

## 2018-06-14 DIAGNOSIS — L600 Ingrowing nail: Secondary | ICD-10-CM | POA: Diagnosis not present

## 2018-06-14 MED ORDER — HYDROCODONE-ACETAMINOPHEN 10-325 MG PO TABS: 1.0000 | ORAL_TABLET | Freq: Three times a day (TID) | ORAL | 0 refills | Status: AC | PRN

## 2018-06-14 NOTE — Progress Notes (Signed)
Subjective:   Patient ID: [name], female   DOB: 58 y.o.   MRN: [id number]   HPI Patient presents stating my right ankle is still really bothering me in the heel seems some better and I have these nails that I can just not work with and they are very tender and they make it hard for me to walk comfortably.  Patient points to the big nail second nail of both feet that are incurvated and thick.  Patient has exquisite discomfort more around the posterior tibial tendon and did not have posterior tibial tendon pain noted currently   ROS      Objective:  Physical Exam  Acute discomfort in the posterior tibial tendon with severe flatfoot deformity right with nail disease hallux second bilateral that are thickened incurvated painful with minimal plantar heel pain noted currently     Assessment:  Posterior tibial tendinitis right with severe flatfoot deformity is complicating factor along with nail disease hallux second nail bilateral and reduced plantar fasciitis     Plan:  H&P x-rays reviewed and today I have recommended immobilization with Cam walker to try to take all stress off posterior tibial tendon and allow her to rest discussed nail procedures and she wants to get them fixed but cannot do today and is scheduled to have removal of the hallux and second nail understanding risk.  Patient will be seen back for procedure to be performed and I did go ahead today and casted for functional orthotics due to severe flatfoot deformity with chronic posterior tibial tendinitis  X-rays indicate severe flatfoot deformity bilateral with signs of subtalar joint arthritis bilateral

## 2018-06-15 ENCOUNTER — Encounter: Payer: BLUE CROSS/BLUE SHIELD | Admitting: Gynecology

## 2018-06-15 ENCOUNTER — Telehealth: Payer: Self-pay | Admitting: Podiatry

## 2018-06-15 NOTE — Telephone Encounter (Signed)
Pt wanted to know if she can take a pain pill before coming to her appointment?

## 2018-06-15 NOTE — Telephone Encounter (Signed)
Left message informing pt that if she took the pain medication she could not drive to or from the appt.

## 2018-06-16 ENCOUNTER — Encounter: Payer: Self-pay | Admitting: Podiatry

## 2018-06-16 ENCOUNTER — Ambulatory Visit (INDEPENDENT_AMBULATORY_CARE_PROVIDER_SITE_OTHER): Payer: BLUE CROSS/BLUE SHIELD | Admitting: Podiatry

## 2018-06-16 ENCOUNTER — Other Ambulatory Visit: Payer: Self-pay

## 2018-06-16 VITALS — Temp 97.3°F

## 2018-06-16 DIAGNOSIS — L600 Ingrowing nail: Secondary | ICD-10-CM

## 2018-06-16 DIAGNOSIS — M76821 Posterior tibial tendinitis, right leg: Secondary | ICD-10-CM

## 2018-06-16 MED ORDER — NEOMYCIN-POLYMYXIN-HC 3.5-10000-1 OT SOLN: OTIC | 1 refills | Status: DC

## 2018-06-16 NOTE — Patient Instructions (Signed)

## 2018-06-16 NOTE — Progress Notes (Signed)
Subjective:   Patient ID: [name], female   DOB: 58 y.o.   MRN: [id number]   HPI Patient presents for nail removal x2 both feet stating that they have increasingly become sore thick and impossible to cut.  Patient is tried trimming and soaking without relief of symptoms   ROS      Objective:  Physical Exam  Neurovascular status intact with thick yellow brittle nailbeds hallux and second bilateral and history of fasciitis tibial tendinitis     Assessment:  Chronic nail disease with thick painful beds hallux second bilateral and history of fasciitis tendinitis with patient wearing boot on the right to help counteract the symptoms     Plan:  H&P condition reviewed and discussed.  At this point I have recommended removal of the nails and the hallux and second nails bilateral be removed.  I explained the surgery and risk and patient wants surgery understanding risk and today I went ahead and infiltrated each toe 60 mg like Marcaine mixture sterile prep applied individually to each toe and then using sterile instrumentation I removed the hallux nail bilateral the second nail bilateral exposed matrix and applied phenol 5 applications 30 seconds followed by alcohol lavage to each bed.  I then applied sterile dressing gave instructions on soaks and to keep dressings on 24 hours but to take them off earlier if needed and patient will be seen back to recheck and was encouraged to call with questions concerns and did write prescription for drops

## 2018-06-22 ENCOUNTER — Telehealth: Payer: Self-pay | Admitting: Podiatry

## 2018-06-22 NOTE — Telephone Encounter (Signed)
Left message on mobile phone informing pt I would try to reach her on the home phone.

## 2018-06-22 NOTE — Telephone Encounter (Addendum)
I called pt and she states the toe looks good and does not hurt, but there is drainage when she puts on the drops. I told pt the drops may be the drainage she is seeing or may be reconstituting any scabbing or crusting that has begun. Pt request a refill of the pain medication for the pain in her foot not her toes, and can't take ibuprofen because it constipates her.

## 2018-06-22 NOTE — Telephone Encounter (Signed)
Right great toe is still oozing from nail procedure done on 06 May. States it is not painful and she is still soaking. Requested a call back on mobile number of [phone].

## 2018-06-23 NOTE — Telephone Encounter (Signed)
I'm not sure what I wrote her for. Cannot keep her on narcotics but would consider tramadol

## 2018-06-23 NOTE — Telephone Encounter (Signed)
I informed pt of Dr. Mellody Drown statement concerning the narcotic pain medication and pt states understanding.

## 2018-06-28 ENCOUNTER — Telehealth: Payer: Self-pay | Admitting: *Deleted

## 2018-06-28 NOTE — Telephone Encounter (Signed)
Pt called states she can't put on her shoes due to the swelling and has appt on 06/30/2018 and needs a note to be out of work another week, because she is scheduled to go back to work 06/30/2018. Pt requested pick up the note tomorrow and I told her is would have it ready after 12:00pm to be out until evaluated on 06/30/2018 and she could ask for a note for extension from Dr. Paulla Dolly.

## 2018-06-29 ENCOUNTER — Encounter: Payer: Self-pay | Admitting: *Deleted

## 2018-06-30 ENCOUNTER — Encounter: Payer: Self-pay | Admitting: Podiatry

## 2018-06-30 ENCOUNTER — Other Ambulatory Visit: Payer: Self-pay

## 2018-06-30 ENCOUNTER — Ambulatory Visit: Payer: BLUE CROSS/BLUE SHIELD | Admitting: Orthotics

## 2018-06-30 ENCOUNTER — Ambulatory Visit (INDEPENDENT_AMBULATORY_CARE_PROVIDER_SITE_OTHER): Payer: BLUE CROSS/BLUE SHIELD | Admitting: Podiatry

## 2018-06-30 VITALS — Temp 96.1°F

## 2018-06-30 DIAGNOSIS — M722 Plantar fascial fibromatosis: Secondary | ICD-10-CM

## 2018-06-30 DIAGNOSIS — M779 Enthesopathy, unspecified: Secondary | ICD-10-CM

## 2018-06-30 DIAGNOSIS — L03032 Cellulitis of left toe: Secondary | ICD-10-CM

## 2018-06-30 DIAGNOSIS — M76821 Posterior tibial tendinitis, right leg: Secondary | ICD-10-CM

## 2018-06-30 NOTE — Progress Notes (Signed)
Patient came in today to pick up custom made foot orthotics.  The goals were accomplished and the patient reported no dissatisfaction with said orthotics.  Patient was advised of breakin period and how to report any issues. 

## 2018-07-01 NOTE — Progress Notes (Signed)
Subjective:   Patient ID: [name], female   DOB: 58 y.o.   MRN: [id number]   HPI Patient presents stating overall she is doing pretty well but she is concerned about some drainage around her big toes and second toes and wants to get them checked   ROS      Objective:  Physical Exam  Neurovascular status intact with patient found to have mild redness on the nail sites bilateral with the hallux left showing slightly increased versus the right     Assessment:  Mild irritation from nail surgery with possible low-grade paronychia infection left with tendinitis fasciitis stable currently     Plan:  H&P conditions reviewed and at this point I went ahead and advised this patient on continued soaks air dry at night bandage usage and continued supportive shoes for chronic foot pain.  Patient will be seen back as needed

## 2018-07-05 ENCOUNTER — Other Ambulatory Visit: Payer: Self-pay

## 2018-07-05 ENCOUNTER — Emergency Department (HOSPITAL_COMMUNITY): Payer: BLUE CROSS/BLUE SHIELD

## 2018-07-05 ENCOUNTER — Encounter (HOSPITAL_COMMUNITY): Payer: Self-pay | Admitting: Emergency Medicine

## 2018-07-05 ENCOUNTER — Observation Stay (HOSPITAL_COMMUNITY)
Admission: EM | Admit: 2018-07-05 | Discharge: 2018-07-07 | Disposition: A | Discharge status: Home / Self Care | Payer: BLUE CROSS/BLUE SHIELD | Attending: Internal Medicine | Admitting: Internal Medicine

## 2018-07-05 DIAGNOSIS — Z6839 Body mass index (BMI) 39.0-39.9, adult: Secondary | ICD-10-CM | POA: Diagnosis not present

## 2018-07-05 DIAGNOSIS — R778 Other specified abnormalities of plasma proteins: Secondary | ICD-10-CM | POA: Diagnosis present

## 2018-07-05 DIAGNOSIS — K219 Gastro-esophageal reflux disease without esophagitis: Secondary | ICD-10-CM | POA: Diagnosis not present

## 2018-07-05 DIAGNOSIS — R7989 Other specified abnormal findings of blood chemistry: Secondary | ICD-10-CM | POA: Diagnosis not present

## 2018-07-05 DIAGNOSIS — K760 Fatty (change of) liver, not elsewhere classified: Secondary | ICD-10-CM | POA: Diagnosis not present

## 2018-07-05 DIAGNOSIS — R1011 Right upper quadrant pain: Secondary | ICD-10-CM

## 2018-07-05 DIAGNOSIS — Z87891 Personal history of nicotine dependence: Secondary | ICD-10-CM | POA: Diagnosis not present

## 2018-07-05 DIAGNOSIS — E785 Hyperlipidemia, unspecified: Secondary | ICD-10-CM | POA: Insufficient documentation

## 2018-07-05 DIAGNOSIS — I2699 Other pulmonary embolism without acute cor pulmonale: Principal | ICD-10-CM | POA: Diagnosis present

## 2018-07-05 DIAGNOSIS — Z791 Long term (current) use of non-steroidal anti-inflammatories (NSAID): Secondary | ICD-10-CM | POA: Diagnosis not present

## 2018-07-05 DIAGNOSIS — D1803 Hemangioma of intra-abdominal structures: Secondary | ICD-10-CM | POA: Diagnosis not present

## 2018-07-05 DIAGNOSIS — J45909 Unspecified asthma, uncomplicated: Secondary | ICD-10-CM | POA: Diagnosis present

## 2018-07-05 DIAGNOSIS — Z1159 Encounter for screening for other viral diseases: Secondary | ICD-10-CM | POA: Insufficient documentation

## 2018-07-05 DIAGNOSIS — M1990 Unspecified osteoarthritis, unspecified site: Secondary | ICD-10-CM | POA: Diagnosis not present

## 2018-07-05 DIAGNOSIS — I10 Essential (primary) hypertension: Secondary | ICD-10-CM | POA: Diagnosis present

## 2018-07-05 DIAGNOSIS — Z7951 Long term (current) use of inhaled steroids: Secondary | ICD-10-CM | POA: Diagnosis not present

## 2018-07-05 DIAGNOSIS — R112 Nausea with vomiting, unspecified: Secondary | ICD-10-CM | POA: Diagnosis not present

## 2018-07-05 DIAGNOSIS — R51 Headache: Secondary | ICD-10-CM | POA: Diagnosis not present

## 2018-07-05 DIAGNOSIS — Z79899 Other long term (current) drug therapy: Secondary | ICD-10-CM | POA: Diagnosis not present

## 2018-07-05 DIAGNOSIS — F1010 Alcohol abuse, uncomplicated: Secondary | ICD-10-CM | POA: Diagnosis not present

## 2018-07-05 DIAGNOSIS — R110 Nausea: Secondary | ICD-10-CM | POA: Diagnosis not present

## 2018-07-05 LAB — CBC
HCT: 44.2 % (ref 36.0–46.0)
Hemoglobin: 14.7 g/dL (ref 12.0–15.0)
MCH: 29.4 pg (ref 26.0–34.0)
MCHC: 33.3 g/dL (ref 30.0–36.0)
MCV: 88.4 fL (ref 80.0–100.0)
Platelets: 265 10*3/uL (ref 150–400)
RBC: 5 MIL/uL (ref 3.87–5.11)
RDW: 13.9 % (ref 11.5–15.5)
WBC: 12.3 10*3/uL — ABNORMAL HIGH (ref 4.0–10.5)
nRBC: 0.2 % (ref 0.0–0.2)

## 2018-07-05 LAB — URINALYSIS, ROUTINE W REFLEX MICROSCOPIC
Bacteria, UA: NONE SEEN
Bilirubin Urine: NEGATIVE
Glucose, UA: NEGATIVE mg/dL
Ketones, ur: 20 mg/dL — AB
Leukocytes,Ua: NEGATIVE
Nitrite: NEGATIVE
Protein, ur: 30 mg/dL — AB
Specific Gravity, Urine: 1.025 (ref 1.005–1.030)
pH: 5 (ref 5.0–8.0)

## 2018-07-05 LAB — BASIC METABOLIC PANEL
Anion gap: 17 — ABNORMAL HIGH (ref 5–15)
BUN: 13 mg/dL (ref 6–20)
CO2: 23 mmol/L (ref 22–32)
Calcium: 9.6 mg/dL (ref 8.9–10.3)
Chloride: 94 mmol/L — ABNORMAL LOW (ref 98–111)
Creatinine, Ser: 1.06 mg/dL — ABNORMAL HIGH (ref 0.44–1.00)
GFR calc Af Amer: >60 mL/min (ref >60)
GFR calc non Af Amer: 58 mL/min — ABNORMAL LOW (ref >60)
Glucose, Bld: 98 mg/dL (ref 70–99)
Potassium: 3.7 mmol/L (ref 3.5–5.1)
Sodium: 134 mmol/L — ABNORMAL LOW (ref 135–145)

## 2018-07-05 LAB — HEPATIC FUNCTION PANEL
ALT: 55 U/L — ABNORMAL HIGH (ref 0–44)
AST: 70 U/L — ABNORMAL HIGH (ref 15–41)
Albumin: 3.6 g/dL (ref 3.5–5.0)
Alkaline Phosphatase: 116 U/L (ref 38–126)
Bilirubin, Direct: 0.4 mg/dL — ABNORMAL HIGH (ref 0.0–0.2)
Indirect Bilirubin: 1.3 mg/dL — ABNORMAL HIGH (ref 0.3–0.9)
Total Bilirubin: 1.7 mg/dL — ABNORMAL HIGH (ref 0.3–1.2)
Total Protein: 7.6 g/dL (ref 6.5–8.1)

## 2018-07-05 LAB — I-STAT BETA HCG BLOOD, ED (MC, WL, AP ONLY): I-stat hCG, quantitative: <5 m[IU]/mL (ref <5)

## 2018-07-05 LAB — LIPASE, BLOOD: Lipase: 25 U/L (ref 11–51)

## 2018-07-05 LAB — TROPONIN I: Troponin I: 0.04 ng/mL (ref <0.03)

## 2018-07-05 MED ORDER — DIPHENHYDRAMINE HCL 50 MG/ML IJ SOLN
25.0000 mg | Freq: Once | INTRAMUSCULAR | Status: AC
  Administered 2018-07-05: 25 mg via INTRAVENOUS
  Filled 2018-07-05: qty 1

## 2018-07-05 MED ORDER — METOCLOPRAMIDE HCL 5 MG/ML IJ SOLN
10.0000 mg | Freq: Once | INTRAMUSCULAR | Status: AC
  Administered 2018-07-05: 10 mg via INTRAVENOUS
  Filled 2018-07-05: qty 2

## 2018-07-05 MED ORDER — LACTATED RINGERS IV BOLUS
1000.0000 mL | Freq: Once | INTRAVENOUS | Status: AC
  Administered 2018-07-05: 1000 mL via INTRAVENOUS

## 2018-07-05 MED ORDER — SODIUM CHLORIDE 0.9% FLUSH: 3.0000 mL | Freq: Once | INTRAVENOUS | Status: DC

## 2018-07-05 NOTE — ED Provider Notes (Signed)
[facility] EMERGENCY DEPARTMENT Provider Note   CSN: [id number] Arrival date & time: 07/05/18  1706    History   Chief Complaint Chief Complaint  Patient presents with   Nausea    HPI [name] is a 58 y.o. female.     HPI  58 year old female presents with a chief complaint of nausea.  She states that this started a couple days ago.  She thinks is from change in diet.  She has had some burning running up her chest.  She states that she has not had any fevers or abdominal pain.  She has had this before about a month ago when she was seen for headache and abdominal discomfort and nausea.  She is had a couple episodes of vomiting without blood.  No urinary symptoms.  No chest pain besides some of the burning that runs up her chest.  No shortness of breath or cough.  She also has a mild to moderate headache. She is asking for the same medicine she was given a month or so ago for nausea/headache, that included benadryl.  Past Medical History:  Diagnosis Date   Alcohol abuse    abuse- moderate years ago - states only drinks now on weekends -2 to 8 driinks    Asthma    COLONIC POLYPS, HX OF 04/05/2010   DIVERTICULITIS, HX OF 04/05/2010   DJD (degenerative joint disease)    right knee, mot to severe   GERD (gastroesophageal reflux disease)    no meds   Heart murmur    hx of    Hyperlipidemia    Hypertension    Impaired glucose tolerance 12/06/2013   Obesity (BMI 30-39.9)    PALPITATIONS, HX OF 09/14/2007    Patient Active Problem List   Diagnosis Date Noted   Leukocytosis 09/22/2017   Abnormal urine color 09/22/2017   Rash 09/22/2017   Sepsis ([place]) 09/12/2016   Colovesical fistula 09/12/2016   Hypertension 07/28/2016   Alcohol abuse 07/28/2016   Obesity (BMI 30-39.9) 07/28/2016   Abscess of bladder 07/28/2016   Colonic diverticulum 07/28/2016   Acute hypokalemia 07/28/2016   Hyperlipidemia 07/10/2016   Mass of left side  of neck 07/10/2016   Head and neck lymphadenopathy 07/10/2016   Acute sinus infection 01/25/2016   Eustachian tube disorder 01/25/2016   Asthma exacerbation 05/18/2015   Peripheral edema 03/16/2015   Asthma 03/16/2015   Right shoulder pain 03/16/2015   Hypokalemia 10/04/2014   Upper airway cough syndrome 10/03/2014   Severe obesity (BMI >= 40) (HCC) 10/03/2014   Lower back pain 05/25/2014   Bilateral shoulder pain 05/25/2014   Chest pain 04/14/2014   Allergic rhinitis 12/06/2013   Impaired glucose tolerance 12/06/2013   Expected blood loss anemia 12/16/2012   Morbid obesity ([place]) 12/15/2012   S/P left TKA 12/14/2012   Goiter 11/26/2012   Preop exam for internal medicine 11/26/2012   Diarrhea 02/25/2012   Vaginal bleeding 04/30/2011   Colon polyps 02/10/2011   Eczema 02/10/2011   Depression 02/10/2011   Preventative health care 09/27/2010   Anxiety state 04/05/2010   DIVERTICULITIS, HX OF 04/05/2010   PALPITATIONS, HX OF 09/14/2007   Obesity 04/24/2007   VOCAL CORD DISORDER 04/24/2007   Extrinsic asthma 04/24/2007   GERD 04/24/2007    Past Surgical History:  Procedure Laterality Date   ABDOMINAL HYSTERECTOMY  age 21   fibroids   COLONOSCOPY WITH PROPOFOL N/A 07/25/2016   Procedure: COLONOSCOPY WITH PROPOFOL;  Surgeon: Carol Ada, MD;  Location: [facility] END[facility]COPY;  Service: Endoscopy;  Laterality: N/A;   colonscopy     x 2   IR RADIOLOGIST EVAL & MGMT  08/12/2016   IR RADIOLOGIST EVAL & MGMT  08/21/2016   KNEE ARTHROSCOPY     left    TOTAL KNEE ARTHROPLASTY  07/29/2011   Procedure: TOTAL KNEE ARTHROPLASTY;  Surgeon: Mauri Pole, MD;  Location: [facility] ORS;  Service: Orthopedics;  Laterality: Right;   TOTAL KNEE ARTHROPLASTY Left 12/14/2012   Procedure: LEFT TOTAL KNEE ARTHROPLASTY;  Surgeon: Mauri Pole, MD;  Location: [facility];  Service: Orthopedics;  Laterality: Left;     OB History    Gravida  1   Para  1   Term       Preterm      AB      Living  1     SAB      TAB      Ectopic      Multiple      Live Births               Home Medications    Prior to Admission medications   Medication Sig Start Date End Date Taking? Authorizing Provider  azelastine (ASTELIN) 0.1 % nasal spray Place 1 spray into both nostrils 2 (two) times daily. Use in each nostril as directed 01/06/18   Biagio Borg, MD  Azelastine-Fluticasone [facility]) [phone] MCG/ACT SUSP 1 spray each nostril twice per day 01/06/18   Biagio Borg, MD  Fluticasone-Salmeterol (ADVAIR DISKUS) 250-50 MCG/DOSE AEPB Inhale 1 puff into the lungs 2 (two) times daily. 09/22/17   Biagio Borg, MD  hydrochlorothiazide (HYDRODIURIL) 25 MG tablet Take 1 tablet (25 mg total) by mouth daily. 01/06/18   Biagio Borg, MD  HYDROcodone-acetaminophen (NORCO) 10-325 MG tablet Take 1 tablet by mouth every 8 (eight) hours as needed. 02/19/18   Wallene Huh, DPM  HYDROcodone-acetaminophen (NORCO/VICODIN) 5-325 MG tablet Take 1 tablet by mouth every 6 (six) hours as needed for moderate pain. 04/29/18   Regal, Tamala Fothergill, DPM  LIPITOR 10 MG tablet TAKE 1 TABLET BY MOUTH EVERY DAY Patient taking differently: Take 10 mg by mouth every morning.  02/15/18   Biagio Borg, MD  losartan (COZAAR) 100 MG tablet Take 1 tablet (100 mg total) by mouth daily. 01/06/18   Biagio Borg, MD  losartan-hydrochlorothiazide (HYZAAR) 100-25 MG tablet TAKE 1 TABLET BY MOUTH EVERY DAY Patient taking differently: Take 1 tablet by mouth daily.  04/14/18   Biagio Borg, MD  meloxicam (MOBIC) 15 MG tablet Take 1 tablet (15 mg total) by mouth daily. 02/19/18   Wallene Huh, DPM  neomycin-polymyxin-hydrocortisone (CORTISPORIN) OTIC solution Apply 1-2 drops to toe after soaking BID 06/16/18   Regal, Tamala Fothergill, DPM  potassium chloride SA (K-DUR,KLOR-CON) 20 MEQ tablet Take 1 tablet (20 mEq total) by mouth daily. Patient taking differently: Take 20 mEq by mouth daily as needed (low potassium).   09/22/17   Biagio Borg, MD  PROAIR HFA 108 [phone] Base) MCG/ACT inhaler INHALE 1 TO 2 PUFFS BY MOUTH EVERY 6 HOURS AS NEEDED FOR WHEEZE Patient taking differently: Inhale 1-2 puffs into the lungs every 6 (six) hours as needed for wheezing or shortness of breath.  02/19/18   Biagio Borg, MD  triamcinolone cream (KENALOG) 0.1 % APPLY TO AFFECTED AREA TWICE A DAY 06/11/18   Biagio Borg, MD    Family History Family History  Problem  Relation Age of Onset   Stroke Mother    COPD Father    Lymphoma Sister     Social History Social History   Tobacco Use   Smoking status: Former Smoker    Packs/day: 0.50    Years: 7.00    Pack years: 3.50    Types: Cigarettes    Last attempt to quit: 02/11/1988    Years since quitting: 30.4   Smokeless tobacco: Never Used  Substance Use Topics   Alcohol use: Yes    Comment: Occas   Drug use: No    Comment: smoked marajuania x 10 years.  Quit in 1985.     Allergies   Morphine; Shrimp [shellfish allergy]; Tramadol; and Latex   Review of Systems Review of Systems  Constitutional: Negative for fever.  Respiratory: Negative for cough and shortness of breath.   Cardiovascular: Negative for chest pain.  Gastrointestinal: Positive for nausea and vomiting. Negative for abdominal pain and diarrhea.  Genitourinary: Negative for dysuria.  Neurological: Positive for headaches.  All other systems reviewed and are negative.    Physical Exam Updated Vital Signs BP (!) 144/74    Pulse (!) 105    Temp 98.1 F (36.7 C) (Oral)    Resp 20    SpO2 98%   Physical Exam Vitals signs and nursing note reviewed.  Constitutional:      General: She is not in acute distress.    Appearance: She is well-developed. She is obese. She is not ill-appearing or diaphoretic.  HENT:     Head: Normocephalic and atraumatic.     Right Ear: External ear normal.     Left Ear: External ear normal.     Nose: Nose normal.  Eyes:     General:        Right eye: No  discharge.        Left eye: No discharge.  Cardiovascular:     Rate and Rhythm: Regular rhythm. Tachycardia present.     Heart sounds: Normal heart sounds.  Pulmonary:     Effort: Pulmonary effort is normal.     Breath sounds: Normal breath sounds.  Abdominal:     Palpations: Abdomen is soft.     Tenderness: There is no abdominal tenderness.  Skin:    General: Skin is warm and dry.  Neurological:     Mental Status: She is alert.  Psychiatric:        Mood and Affect: Mood is not anxious.      ED Treatments / Results  Labs (all labs ordered are listed, but only abnormal results are displayed) Labs Reviewed  BASIC METABOLIC PANEL - Abnormal; Notable for the following components:      Result Value   Sodium 134 (*)    Chloride 94 (*)    Creatinine, Ser 1.06 (*)    GFR calc non Af Amer 58 (*)    Anion gap 17 (*)    All other components within normal limits  CBC - Abnormal; Notable for the following components:   WBC 12.3 (*)    All other components within normal limits  URINALYSIS, ROUTINE W REFLEX MICROSCOPIC - Abnormal; Notable for the following components:   Hgb urine dipstick SMALL (*)    Ketones, ur 20 (*)    Protein, ur 30 (*)    All other components within normal limits  HEPATIC FUNCTION PANEL  LIPASE, BLOOD  TROPONIN I  I-STAT BETA HCG BLOOD, ED ([facility], [facility], [facility] ONLY)  EKG EKG Interpretation  Date/Time:  Monday Jul 05 2018 21:36:46 EDT Ventricular Rate:  103 PR Interval:    QRS Duration: 98 QT Interval:  366 QTC Calculation: 480 R Axis:   29 Text Interpretation:  Sinus tachycardia Baseline wander in lead(s) V1 V3 Poor data quality in current ECG precludes serial comparison Confirmed by Sherwood Gambler [phone]) on 07/05/2018 9:47:24 PM   Radiology Dg Chest 2 View  Result Date: 07/05/2018 CLINICAL DATA:  Nausea. EXAM: CHEST - 2 VIEW COMPARISON:  05/11/2018 FINDINGS: The lungs are clear without focal pneumonia, edema, pneumothorax or pleural effusion. The  cardiopericardial silhouette is within normal limits for size. Prominent fat pad at the cardiac apex as confirmed by abdomen CT of 06/09/2018. The visualized bony structures of the thorax are intact. IMPRESSION: Stable.  No acute cardiopulmonary findings. Electronically Signed   By: Misty Stanley M.D.   On: 07/05/2018 18:35    Procedures Procedures (including critical care time)  Medications Ordered in ED Medications  sodium chloride flush (NS) 0.9 % injection 3 mL (has no administration in time range)  metoCLOPramide (REGLAN) injection 10 mg (10 mg Intravenous Given 07/05/18 2232)  diphenhydrAMINE (BENADRYL) injection 25 mg (25 mg Intravenous Given 07/05/18 2228)  lactated ringers bolus 1,000 mL (1,000 mLs Intravenous New Bag/Given 07/05/18 2237)     Initial Impression / Assessment and Plan / ED Course  I have reviewed the triage vital signs and the nursing notes.  Pertinent labs & imaging results that were available during my care of the patient were reviewed by me and considered in my medical decision making (see chart for details).        Patient will be given IV fluids as well as nausea/headache treatment.  The headache she is having is bitemporal and very similar to multiple prior headaches.  Benign neuro exam.  I highly doubt acute CNS emergency.  The chest pain is very atypical and sounds more like GERD with the burning.  There is no shortness of breath, increased work of breathing or hypoxia.  My suspicion for PE is extremely low and she is had this before.  I do not think a work-up is indicated.  Currently labs are pending as well as treatment of her nausea.  Assuming these are okay I think she stable for discharge home.  Care to Dr. Wyvonnia Dusky.  Final Clinical Impressions(s) / ED Diagnoses   Final diagnoses:  None    ED Discharge Orders    None       Sherwood Gambler, MD 07/05/18 2307

## 2018-07-05 NOTE — ED Notes (Signed)
Pt states her toes are painful and she thinks that's why her BP is high.

## 2018-07-05 NOTE — ED Triage Notes (Signed)
Pt states she has been nauseous since yesterday after eating ox tails. States she had 2 toenails removed Wednesday and thinks that's why she is hypertensive today. Denies fever, cough, SOB

## 2018-07-06 ENCOUNTER — Observation Stay (HOSPITAL_BASED_OUTPATIENT_CLINIC_OR_DEPARTMENT_OTHER): Payer: BLUE CROSS/BLUE SHIELD

## 2018-07-06 ENCOUNTER — Encounter (HOSPITAL_COMMUNITY): Payer: Self-pay | Admitting: Internal Medicine

## 2018-07-06 ENCOUNTER — Emergency Department (HOSPITAL_COMMUNITY): Payer: BLUE CROSS/BLUE SHIELD

## 2018-07-06 ENCOUNTER — Telehealth: Payer: Self-pay | Admitting: *Deleted

## 2018-07-06 ENCOUNTER — Other Ambulatory Visit: Payer: Self-pay

## 2018-07-06 DIAGNOSIS — R1011 Right upper quadrant pain: Secondary | ICD-10-CM | POA: Diagnosis not present

## 2018-07-06 DIAGNOSIS — R7989 Other specified abnormal findings of blood chemistry: Secondary | ICD-10-CM | POA: Diagnosis not present

## 2018-07-06 DIAGNOSIS — I2699 Other pulmonary embolism without acute cor pulmonale: Secondary | ICD-10-CM | POA: Diagnosis not present

## 2018-07-06 DIAGNOSIS — I10 Essential (primary) hypertension: Secondary | ICD-10-CM | POA: Diagnosis not present

## 2018-07-06 DIAGNOSIS — R778 Other specified abnormalities of plasma proteins: Secondary | ICD-10-CM | POA: Diagnosis present

## 2018-07-06 DIAGNOSIS — I361 Nonrheumatic tricuspid (valve) insufficiency: Secondary | ICD-10-CM

## 2018-07-06 DIAGNOSIS — R112 Nausea with vomiting, unspecified: Secondary | ICD-10-CM | POA: Diagnosis not present

## 2018-07-06 LAB — HEPARIN LEVEL (UNFRACTIONATED)
Heparin Unfractionated: 0.15 IU/mL — ABNORMAL LOW (ref 0.30–0.70)
Heparin Unfractionated: 0.27 IU/mL — ABNORMAL LOW (ref 0.30–0.70)

## 2018-07-06 LAB — CBC
HCT: 44 % (ref 36.0–46.0)
Hemoglobin: 14.9 g/dL (ref 12.0–15.0)
MCH: 29.7 pg (ref 26.0–34.0)
MCHC: 33.9 g/dL (ref 30.0–36.0)
MCV: 87.6 fL (ref 80.0–100.0)
Platelets: 252 10*3/uL (ref 150–400)
RBC: 5.02 MIL/uL (ref 3.87–5.11)
RDW: 13.8 % (ref 11.5–15.5)
WBC: 11.2 10*3/uL — ABNORMAL HIGH (ref 4.0–10.5)
nRBC: 0 % (ref 0.0–0.2)

## 2018-07-06 LAB — TROPONIN I
Troponin I: 0.04 ng/mL (ref <0.03)
Troponin I: 0.04 ng/mL (ref <0.03)
Troponin I: 0.04 ng/mL (ref <0.03)
Troponin I: 0.03 ng/mL (ref <0.03)
Troponin I: 0.03 ng/mL (ref <0.03)

## 2018-07-06 LAB — GLUCOSE, CAPILLARY
Glucose-Capillary: 115 mg/dL — ABNORMAL HIGH (ref 70–99)
Glucose-Capillary: 199 mg/dL — ABNORMAL HIGH (ref 70–99)

## 2018-07-06 LAB — LIPASE, BLOOD: Lipase: 33 U/L (ref 11–51)

## 2018-07-06 LAB — HEPATIC FUNCTION PANEL
ALT: 59 U/L — ABNORMAL HIGH (ref 0–44)
AST: 80 U/L — ABNORMAL HIGH (ref 15–41)
Albumin: 3.4 g/dL — ABNORMAL LOW (ref 3.5–5.0)
Alkaline Phosphatase: 105 U/L (ref 38–126)
Bilirubin, Direct: 0.2 mg/dL (ref 0.0–0.2)
Indirect Bilirubin: 0.9 mg/dL (ref 0.3–0.9)
Total Bilirubin: 1.1 mg/dL (ref 0.3–1.2)
Total Protein: 7.3 g/dL (ref 6.5–8.1)

## 2018-07-06 LAB — BASIC METABOLIC PANEL
Anion gap: 16 — ABNORMAL HIGH (ref 5–15)
BUN: 13 mg/dL (ref 6–20)
CO2: 24 mmol/L (ref 22–32)
Calcium: 9.8 mg/dL (ref 8.9–10.3)
Chloride: 95 mmol/L — ABNORMAL LOW (ref 98–111)
Creatinine, Ser: 1 mg/dL (ref 0.44–1.00)
GFR calc Af Amer: >60 mL/min (ref >60)
GFR calc non Af Amer: >60 mL/min (ref >60)
Glucose, Bld: 104 mg/dL — ABNORMAL HIGH (ref 70–99)
Potassium: 3.3 mmol/L — ABNORMAL LOW (ref 3.5–5.1)
Sodium: 135 mmol/L (ref 135–145)

## 2018-07-06 LAB — D-DIMER, QUANTITATIVE: D-Dimer, Quant: 0.88 ug/mL-FEU — ABNORMAL HIGH (ref 0.00–0.50)

## 2018-07-06 LAB — ECHOCARDIOGRAM COMPLETE
Height: 61.5 in
Weight: 4665.6 oz

## 2018-07-06 LAB — SARS CORONAVIRUS 2 BY RT PCR (HOSPITAL ORDER, PERFORMED IN ~~LOC~~ HOSPITAL LAB): SARS Coronavirus 2: NEGATIVE

## 2018-07-06 LAB — HIV ANTIBODY (ROUTINE TESTING W REFLEX): HIV Screen 4th Generation wRfx: NONREACTIVE

## 2018-07-06 MED ORDER — HYDRALAZINE HCL 20 MG/ML IJ SOLN: 10.0000 mg | Freq: Three times a day (TID) | INTRAMUSCULAR | Status: DC | PRN

## 2018-07-06 MED ORDER — HYDROCODONE-ACETAMINOPHEN 10-325 MG PO TABS: 1.0000 | ORAL_TABLET | Freq: Three times a day (TID) | ORAL | Status: DC | PRN

## 2018-07-06 MED ORDER — POTASSIUM CHLORIDE CRYS ER 20 MEQ PO TBCR
20.0000 meq | EXTENDED_RELEASE_TABLET | Freq: Once | ORAL | Status: AC
  Administered 2018-07-06: 20 meq via ORAL
  Filled 2018-07-06: qty 1

## 2018-07-06 MED ORDER — ALBUTEROL SULFATE (2.5 MG/3ML) 0.083% IN NEBU: 2.5000 mg | INHALATION_SOLUTION | Freq: Four times a day (QID) | RESPIRATORY_TRACT | Status: DC | PRN

## 2018-07-06 MED ORDER — PANTOPRAZOLE SODIUM 40 MG PO TBEC
40.0000 mg | DELAYED_RELEASE_TABLET | Freq: Two times a day (BID) | ORAL | Status: DC
  Administered 2018-07-06 – 2018-07-07 (×3): 40 mg via ORAL
  Filled 2018-07-06 (×3): qty 1

## 2018-07-06 MED ORDER — LOSARTAN POTASSIUM 50 MG PO TABS
100.0000 mg | ORAL_TABLET | Freq: Every day | ORAL | Status: DC
  Administered 2018-07-06: 100 mg via ORAL
  Filled 2018-07-06: qty 2

## 2018-07-06 MED ORDER — LORAZEPAM 1 MG PO TABS: 1.0000 mg | ORAL_TABLET | Freq: Once | ORAL | Status: DC

## 2018-07-06 MED ORDER — LORAZEPAM 1 MG PO TABS
1.0000 mg | ORAL_TABLET | Freq: Once | ORAL | Status: AC
  Administered 2018-07-06: 1 mg via ORAL
  Filled 2018-07-06: qty 1

## 2018-07-06 MED ORDER — IOHEXOL 350 MG/ML SOLN
75.0000 mL | Freq: Once | INTRAVENOUS | Status: AC | PRN
  Administered 2018-07-06: 75 mL via INTRAVENOUS

## 2018-07-06 MED ORDER — MOMETASONE FURO-FORMOTEROL FUM 100-5 MCG/ACT IN AERO
2.0000 | INHALATION_SPRAY | Freq: Two times a day (BID) | RESPIRATORY_TRACT | Status: DC
  Filled 2018-07-06: qty 8.8

## 2018-07-06 MED ORDER — PERFLUTREN LIPID MICROSPHERE
1.0000 mL | INTRAVENOUS | Status: AC | PRN
  Administered 2018-07-06: 4 mL via INTRAVENOUS
  Filled 2018-07-06: qty 10

## 2018-07-06 MED ORDER — LORAZEPAM 0.5 MG PO TABS
0.5000 mg | ORAL_TABLET | Freq: Two times a day (BID) | ORAL | Status: DC | PRN
  Administered 2018-07-06 – 2018-07-07 (×3): 0.5 mg via ORAL
  Filled 2018-07-06 (×3): qty 1

## 2018-07-06 MED ORDER — HEPARIN BOLUS VIA INFUSION
4500.0000 [IU] | Freq: Once | INTRAVENOUS | Status: AC
  Administered 2018-07-06: 4500 [IU] via INTRAVENOUS
  Filled 2018-07-06: qty 4500

## 2018-07-06 MED ORDER — ACETAMINOPHEN 650 MG RE SUPP: 650.0000 mg | Freq: Four times a day (QID) | RECTAL | Status: DC | PRN

## 2018-07-06 MED ORDER — PANTOPRAZOLE SODIUM 40 MG PO TBEC: 40.0000 mg | DELAYED_RELEASE_TABLET | Freq: Every day | ORAL | Status: DC

## 2018-07-06 MED ORDER — ATORVASTATIN CALCIUM 10 MG PO TABS
10.0000 mg | ORAL_TABLET | Freq: Every day | ORAL | Status: DC
  Administered 2018-07-06 – 2018-07-07 (×2): 10 mg via ORAL
  Filled 2018-07-06 (×2): qty 1

## 2018-07-06 MED ORDER — ACETAMINOPHEN 325 MG PO TABS: 650.0000 mg | ORAL_TABLET | Freq: Four times a day (QID) | ORAL | Status: DC | PRN

## 2018-07-06 MED ORDER — SODIUM CHLORIDE 0.9 % IV BOLUS
500.0000 mL | Freq: Once | INTRAVENOUS | Status: AC
  Administered 2018-07-06: 500 mL via INTRAVENOUS

## 2018-07-06 MED ORDER — ONDANSETRON HCL 4 MG PO TABS: 4.0000 mg | ORAL_TABLET | Freq: Four times a day (QID) | ORAL | Status: DC | PRN

## 2018-07-06 MED ORDER — HYDROCHLOROTHIAZIDE 25 MG PO TABS
25.0000 mg | ORAL_TABLET | Freq: Every day | ORAL | Status: DC
  Administered 2018-07-06: 25 mg via ORAL
  Filled 2018-07-06: qty 1

## 2018-07-06 MED ORDER — HEPARIN (PORCINE) 25000 UT/250ML-% IV SOLN
2000.0000 [IU]/h | INTRAVENOUS | Status: DC
  Administered 2018-07-06: 1450 [IU]/h via INTRAVENOUS
  Administered 2018-07-06: 2000 [IU]/h via INTRAVENOUS
  Administered 2018-07-06: 1700 [IU]/h via INTRAVENOUS
  Filled 2018-07-06 (×3): qty 250

## 2018-07-06 MED ORDER — HEPARIN BOLUS VIA INFUSION
2400.0000 [IU] | Freq: Once | INTRAVENOUS | Status: AC
  Administered 2018-07-06: 2400 [IU] via INTRAVENOUS
  Filled 2018-07-06: qty 2400

## 2018-07-06 MED ORDER — ONDANSETRON HCL 4 MG/2ML IJ SOLN: 4.0000 mg | Freq: Four times a day (QID) | INTRAMUSCULAR | Status: DC | PRN

## 2018-07-06 NOTE — ED Notes (Signed)
Patient transported to Ultrasound 

## 2018-07-06 NOTE — ED Notes (Signed)
ED Provider at bedside. 

## 2018-07-06 NOTE — ED Notes (Signed)
Attempted to cal report, no answer

## 2018-07-06 NOTE — TOC Benefit Eligibility Note (Signed)
Transition of Care [facility]) Benefit Eligibility Note    Patient Details  Name: [name] MRN: [id number] Date of Birth: 03-04-1960   Medication/Dose: Alveda Reasons 20mg  QD     Prescription Coverage Preferred Pharmacy: Most major retail pharmacies  Spoke with Person/Company/Phone Number:: [employer]   Co-Pay: $35 for 30 day/ $105 90day mail order     Additional Notes: co-pay assistance card available    Delorse Lek Phone Number: 07/06/2018, 10:04 AM

## 2018-07-06 NOTE — ED Notes (Signed)
ED TO INPATIENT HANDOFF REPORT  ED Nurse Name and Phone #: Kathie Rhodes RN  S Name/Age/Gender [name] 58 y.o. female Room/Bed: 023C/023C  Code Status   Code Status: Full Code  Home/SNF/Other Home Patient oriented to: self, place, time and situation Is this baseline? Yes   Triage Complete: Triage complete  Chief Complaint nausea high BP  Triage Note Pt states she has been nauseous since yesterday after eating ox tails. States she had 2 toenails removed Wednesday and thinks that's why she is hypertensive today. Denies fever, cough, SOB   Allergies Allergies  Allergen Reactions  . Morphine Itching  . Shrimp [Shellfish Allergy] Itching and Other (See Comments)    Tongue burns also  . Tramadol Other (See Comments)    Caused confusion  . Latex Rash    Level of Care/Admitting Diagnosis ED Disposition    ED Disposition Condition Comment   Admit  Hospital Area: [facility] [100100]  Level of Care: Telemetry Medical [104]  I expect the patient will be discharged within 24 hours: No (not a candidate for 5C-Observation unit)  Covid Evaluation: Screening Protocol (No Symptoms)  Diagnosis: Pulmonary embolism ([place]) [811572]  Admitting Physician: Rise Patience [phone]  Attending Physician: Rise Patience Lei.Right  PT Class (Do Not Modify): Observation [104]  PT Acc Code (Do Not Modify): Observation [10022]       B Medical/Surgery History Past Medical History:  Diagnosis Date  . Alcohol abuse    abuse- moderate years ago - states only drinks now on weekends -2 to 8 driinks   . Asthma   . COLONIC POLYPS, HX OF 04/05/2010  . DIVERTICULITIS, HX OF 04/05/2010  . DJD (degenerative joint disease)    right knee, mot to severe  . GERD (gastroesophageal reflux disease)    no meds  . Heart murmur    hx of   . Hyperlipidemia   . Hypertension   . Impaired glucose tolerance 12/06/2013  . Obesity (BMI 30-39.9)   . PALPITATIONS, HX OF 09/14/2007    Past Surgical History:  Procedure Laterality Date  . ABDOMINAL HYSTERECTOMY  age 59   fibroids  . COLONOSCOPY WITH PROPOFOL N/A 07/25/2016   Procedure: COLONOSCOPY WITH PROPOFOL;  Surgeon: Carol Ada, MD;  Location: [facility];  Service: Endoscopy;  Laterality: N/A;  . colonscopy     x 2  . IR RADIOLOGIST EVAL & MGMT  08/12/2016  . IR RADIOLOGIST EVAL & MGMT  08/21/2016  . KNEE ARTHROSCOPY     left   . TOTAL KNEE ARTHROPLASTY  07/29/2011   Procedure: TOTAL KNEE ARTHROPLASTY;  Surgeon: Mauri Pole, MD;  Location: [facility];  Service: Orthopedics;  Laterality: Right;  . TOTAL KNEE ARTHROPLASTY Left 12/14/2012   Procedure: LEFT TOTAL KNEE ARTHROPLASTY;  Surgeon: Mauri Pole, MD;  Location: [facility];  Service: Orthopedics;  Laterality: Left;     A IV Location/Drains/Wounds Patient Lines/Drains/Airways Status   Active Line/Drains/Airways    Name:   Placement date:   Placement time:   Site:   Days:   Peripheral IV 07/05/18 Right Antecubital   07/05/18    2222    Antecubital   1   Incision (Closed) 07/29/11 Knee Right   07/29/11    1647     2534   Incision (Closed) 07/29/11 Knee Right   07/29/11    1742     2534   Incision (Closed) 12/14/12 Knee Left   12/14/12    0935  2030          Intake/Output Last 24 hours No intake or output data in the 24 hours ending 07/06/18 0500  Labs/Imaging Results for orders placed or performed during the hospital encounter of 07/05/18 (from the past 48 hour(s))  Basic metabolic panel     Status: Abnormal   Collection Time: 07/05/18  5:47 PM  Result Value Ref Range   Sodium 134 (L) 135 - 145 mmol/L   Potassium 3.7 3.5 - 5.1 mmol/L   Chloride 94 (L) 98 - 111 mmol/L   CO2 23 22 - 32 mmol/L   Glucose, Bld 98 70 - 99 mg/dL   BUN 13 6 - 20 mg/dL   Creatinine, Ser 1.06 (H) 0.44 - 1.00 mg/dL   Calcium 9.6 8.9 - 10.3 mg/dL   GFR calc non Af Amer 58 (L) >60 mL/min   GFR calc Af Amer >60 >60 mL/min   Anion gap 17 (H) 5 - 15    Comment: Performed  at [facility], [address], [place], [place] [address]  CBC     Status: Abnormal   Collection Time: 07/05/18  5:47 PM  Result Value Ref Range   WBC 12.3 (H) 4.0 - 10.5 K/uL   RBC 5.00 3.87 - 5.11 MIL/uL   Hemoglobin 14.7 12.0 - 15.0 g/dL   HCT 44.2 36.0 - 46.0 %   MCV 88.4 80.0 - 100.0 fL   MCH 29.4 26.0 - 34.0 pg   MCHC 33.3 30.0 - 36.0 g/dL   RDW 13.9 11.5 - 15.5 %   Platelets 265 150 - 400 K/uL   nRBC 0.2 0.0 - 0.2 %    Comment: Performed at [facility], [place] [address] [place], [place] [address]  I-Stat beta hCG blood, ED     Status: None   Collection Time: 07/05/18  6:07 PM  Result Value Ref Range   I-stat hCG, quantitative <5.0 <5 mIU/mL   Comment 3            Comment:   GEST. AGE      CONC.  (mIU/mL)   <=1 WEEK        5 - 50     2 WEEKS       50 - 500     3 WEEKS       100 - 10,000     4 WEEKS     1,000 - 30,000        FEMALE AND NON-PREGNANT FEMALE:     LESS THAN 5 mIU/mL   Hepatic function panel     Status: Abnormal   Collection Time: 07/05/18  9:44 PM  Result Value Ref Range   Total Protein 7.6 6.5 - 8.1 g/dL   Albumin 3.6 3.5 - 5.0 g/dL   AST 70 (H) 15 - 41 U/L    Comment: SLIGHT HEMOLYSIS   ALT 55 (H) 0 - 44 U/L   Alkaline Phosphatase 116 38 - 126 U/L   Total Bilirubin 1.7 (H) 0.3 - 1.2 mg/dL   Bilirubin, Direct 0.4 (H) 0.0 - 0.2 mg/dL   Indirect Bilirubin 1.3 (H) 0.3 - 0.9 mg/dL    Comment: Performed at [place] [address] [place], [place] [address]  Lipase, blood     Status: None   Collection Time: 07/05/18  9:44 PM  Result Value Ref Range   Lipase 25 11 - 51 U/L    Comment: Performed at [facility], [address], [place], [place] [address]  Troponin I - Once     Status: Abnormal   Collection Time: 07/05/18  9:44 PM  Result Value Ref Range   Troponin I 0.04 (HH) <0.03 ng/mL    Comment: CRITICAL RESULT CALLED TO, READ BACK BY AND VERIFIED WITH: JOHNSTON P,RN 07/05/18 2344 WAYK Performed at [facility], [place] [address] [place], [place] [address]   Urinalysis, Routine w reflex microscopic     Status: Abnormal   Collection Time: 07/05/18 10:17 PM  Result Value Ref Range   Color, Urine YELLOW YELLOW   APPearance CLEAR CLEAR   Specific Gravity, Urine 1.025 1.005 - 1.030   pH 5.0 5.0 - 8.0   Glucose, UA NEGATIVE NEGATIVE mg/dL   Hgb urine dipstick SMALL (A) NEGATIVE   Bilirubin Urine NEGATIVE NEGATIVE   Ketones, ur 20 (A) NEGATIVE mg/dL   Protein, ur 30 (A) NEGATIVE mg/dL   Nitrite NEGATIVE NEGATIVE   Leukocytes,Ua NEGATIVE NEGATIVE   RBC / HPF 0-5 0 - 5 RBC/hpf   WBC, UA 0-5 0 - 5 WBC/hpf   Bacteria, UA NONE SEEN NONE SEEN   Squamous Epithelial / LPF 0-5 0 - 5   Mucus PRESENT     Comment: Performed at [facility], [place] [address], [place], [place] [address]  Troponin I - ONCE - STAT     Status: Abnormal   Collection Time: 07/05/18 11:20 PM  Result Value Ref Range   Troponin I 0.04 (HH) <0.03 ng/mL    Comment: CRITICAL VALUE NOTED.  VALUE IS CONSISTENT WITH PREVIOUSLY REPORTED AND CALLED VALUE. Performed at [facility], [place] [address], [place], [place] [address]   D-dimer, quantitative (not at [facility])     Status: Abnormal   Collection Time: 07/06/18  1:59 AM  Result Value Ref Range   D-Dimer, Quant 0.88 (H) 0.00 - 0.50 ug/mL-FEU    Comment: (NOTE) At the manufacturer cut-off of 0.50 ug/mL FEU, this assay has been documented to exclude PE with a sensitivity and negative predictive value of 97 to 99%.  At this time, this assay has not been approved by the FDA to exclude DVT/VTE. Results should be correlated with clinical presentation. Performed at [facility], [place] [address] [place], [place] [address]   Troponin I - ONCE - STAT     Status: Abnormal   Collection Time: 07/06/18  1:59 AM  Result Value Ref Range   Troponin I 0.04 (HH) <0.03 ng/mL    Comment: CRITICAL VALUE NOTED.  VALUE IS CONSISTENT WITH PREVIOUSLY REPORTED AND CALLED VALUE. Performed at [facility], [place] [address], [place], [place] [address]   CBC     Status: Abnormal   Collection Time: 07/06/18  4:08 AM  Result Value Ref Range   WBC 11.2 (H) 4.0 - 10.5 K/uL   RBC 5.02 3.87 - 5.11 MIL/uL   Hemoglobin 14.9 12.0 - 15.0 g/dL   HCT 44.0 36.0 - 46.0 %   MCV 87.6 80.0 - 100.0 fL   MCH 29.7 26.0 - 34.0 pg   MCHC 33.9 30.0 - 36.0 g/dL   RDW 13.8 11.5 - 15.5 %   Platelets 252 150 - 400 K/uL   nRBC 0.0 0.0 - 0.2 %    Comment: Performed at [facility], [place] [address], [place], [place] [address]   Dg Chest 2 View  Result Date: 07/05/2018 CLINICAL DATA:  Nausea. EXAM: CHEST -  2 VIEW COMPARISON:  05/11/2018 FINDINGS: The lungs are clear without focal pneumonia, edema, pneumothorax or pleural effusion. The cardiopericardial silhouette is within normal limits for size. Prominent fat pad at the cardiac apex as confirmed by abdomen CT of 06/09/2018. The visualized bony structures of the thorax are intact. IMPRESSION: Stable.  No acute cardiopulmonary findings. Electronically Signed   By: Misty Stanley M.D.   On: 07/05/2018 18:35   Ct Angio Chest Pe W And/or Wo Contrast  Addendum Date: 07/06/2018   ADDENDUM REPORT: 07/06/2018 03:50 ADDENDUM: In comparison to prior CT dated 06/09/2018, the liver lesions appear stable and are consistent with benign hepatic hemangiomas. No further follow-up is required for these liver lesions. Electronically Signed   By: Constance Holster M.D.   On: 07/06/2018 03:50   Result Date: 07/06/2018 CLINICAL DATA:  Nausea and vomiting.  Positive D-dimer. EXAM: CT ANGIOGRAPHY CHEST WITH CONTRAST TECHNIQUE: Multidetector CT imaging of the chest was performed using the standard protocol during bolus administration of intravenous contrast. Multiplanar CT image reconstructions and MIPs were obtained to evaluate the vascular anatomy. CONTRAST:  62mL OMNIPAQUE IOHEXOL 350 MG/ML SOLN COMPARISON:  None. FINDINGS: Cardiovascular: Examination is limited by patient body  habitus and respiratory motion artifact. Findings are suspicious for multiple bilateral segmental and subsegmental pulmonary emboli. There is no CT evidence of right heart strain. The heart is enlarged. Coronary artery calcifications and aortic calcifications are noted. Mediastinum/Nodes: There is an enlarged 2.1 cm subcarinal lymph node. There are no pathologically enlarged supraclavicular or axillary lymph nodes. Lungs/Pleura: Lungs are clear. No pleural effusion or pneumothorax. The lung volumes are low. The trachea is unremarkable. Upper Abdomen: There multiple hyperattenuating or enhancing lesions throughout the liver. The largest is located at the dome and measures approximately 3.9 cm. There is a 3.2 cm hyperattenuating lesion in hepatic segment 2. Additional smaller lesions are noted throughout the right hepatic lobe. Musculoskeletal: No chest wall abnormality. No acute or significant osseous findings. Review of the MIP images confirms the above findings. IMPRESSION: 1. Examination limited by patient body habitus and motion artifact. Given these limitations, there appear to be a few segmental and subsegmental filling defects bilaterally concerning for acute pulmonary emboli, especially in the setting of a positive D-dimer. There is no CT evidence of heart strain. 2. Multiple hyperattenuating lesions throughout the liver measuring up to approximately 3.9 cm. These are highly concerning for metastatic disease or primary liver cancer until proven otherwise. Multiple hepatic hemangiomas could have a similar appearance but this seems less likely. 3. Severe hepatic steatosis. 4. Enlarged subcarinal lymph node concerning for nodal metastatic disease. Cardiomegaly These results were called by telephone at the time of interpretation on 07/06/2018 at 3:29 am to Dr. Ezequiel Essex , who verbally acknowledged these results. Aortic Atherosclerosis (ICD10-I70.0). Electronically Signed: By: Constance Holster M.D. On:  07/06/2018 03:31   US Abdomen Limited Ruq  Result Date: 07/06/2018 CLINICAL DATA:  Right upper quadrant pain EXAM: ULTRASOUND ABDOMEN LIMITED RIGHT UPPER QUADRANT COMPARISON:  06/09/2018 FINDINGS: Gallbladder: No gallstones or wall thickening visualized. No sonographic Murphy sign noted by sonographer. Common bile duct: Diameter: 6 mm Liver: Mild increased echogenicity is noted consistent with fatty infiltration. Portal vein is patent on color Doppler imaging with normal direction of blood flow towards the liver. IMPRESSION: Fatty liver. Electronically Signed   By: Inez Catalina M.D.   On: 07/06/2018 02:09    Pending Labs [employer] (From admission, onward)    Start     Ordered  07/07/18 0500  Heparin level (unfractionated)  Daily,   R     07/06/18 0355   07/06/18 1000  Heparin level (unfractionated)  Once-Timed,   R     07/06/18 0355   07/06/18 0500  CBC  Daily,   R     07/06/18 0355   07/06/18 0500  HIV antibody (Routine Testing)  Tomorrow morning,   R     07/06/18 0403   07/06/18 6283  Basic metabolic panel  Tomorrow morning,   R     07/06/18 0403   07/06/18 0403  Troponin I - Now Then Q6H  Now then every 6 hours,   R     07/06/18 0403   07/06/18 0358  SARS Coronavirus 2 (CEPHEID - Performed in [facility] hospital lab), Hosp Order  (Asymptomatic Patients Labs)  Once,   R    Question:  Rule Out  Answer:  Yes   07/06/18 0357          Vitals/Pain Today's Vitals   07/06/18 0100 07/06/18 0115 07/06/18 0300 07/06/18 0404  BP: 137/81 (!) 148/92    Pulse: (!) 107 (!) 115    Resp: 18 (!) 24    Temp:      TempSrc:      SpO2: 97% 96%    Weight:   (!) 140 kg 129.3 kg  Height:   5\' 3"  (1.6 m) 5' 1.5" (1.562 m)  PainSc:        Isolation Precautions No active isolations  Medications Medications  sodium chloride flush (NS) 0.9 % injection 3 mL (3 mLs Intravenous Not Given 07/06/18 0018)  heparin ADULT infusion 100 units/mL (25000 units/236mL sodium chloride 0.45%) (1,450  Units/hr Intravenous New Bag/Given 07/06/18 0457)  HYDROcodone-acetaminophen (NORCO) 10-325 MG per tablet 1 tablet (has no administration in time range)  atorvastatin (LIPITOR) tablet 10 mg (has no administration in time range)  losartan (COZAAR) tablet 100 mg (has no administration in time range)  albuterol (PROVENTIL) (2.5 MG/3ML) 0.083% nebulizer solution 2.5 mg (has no administration in time range)  acetaminophen (TYLENOL) tablet 650 mg (has no administration in time range)    Or  acetaminophen (TYLENOL) suppository 650 mg (has no administration in time range)  ondansetron (ZOFRAN) tablet 4 mg (has no administration in time range)    Or  ondansetron (ZOFRAN) injection 4 mg (has no administration in time range)  metoCLOPramide (REGLAN) injection 10 mg (10 mg Intravenous Given 07/05/18 2232)  diphenhydrAMINE (BENADRYL) injection 25 mg (25 mg Intravenous Given 07/05/18 2228)  lactated ringers bolus 1,000 mL (0 mLs Intravenous Stopped 07/06/18 0145)  sodium chloride 0.9 % bolus 500 mL (0 mLs Intravenous Stopped 07/06/18 0304)  iohexol (OMNIPAQUE) 350 MG/ML injection 75 mL (75 mLs Intravenous Contrast Given 07/06/18 0308)  heparin bolus via infusion 4,500 Units (4,500 Units Intravenous Bolus from Bag 07/06/18 0459)  LORazepam (ATIVAN) tablet 1 mg (1 mg Oral Given 07/06/18 0448)    Mobility walks Low fall risk   Focused Assessments Pulmonary Assessment Handoff:  Lung sounds:   O2 Device: Room Air        R Recommendations: See Admitting Provider Note  Report given to:   Additional Notes: Alert and oriented

## 2018-07-06 NOTE — Progress Notes (Signed)
ANTICOAGULATION CONSULT NOTE - Initial Consult  Pharmacy Consult for heparin Indication: pulmonary embolus  Allergies  Allergen Reactions  . Morphine Itching  . Shrimp [Shellfish Allergy] Itching and Other (See Comments)    Tongue burns also  . Tramadol Other (See Comments)    Caused confusion  . Latex Rash    Patient Measurements: Height: 5\' 3"  (160 cm) Weight: (!) 308 lb 10.3 oz (140 kg) IBW/kg (Calculated) : 52.4 Heparin Dosing Weight: 87.8 kg   Vital Signs: Temp: 98.1 F (36.7 C) (05/25 1734) Temp Source: Oral (05/25 1734) BP: 148/92 (05/26 0115) Pulse Rate: 115 (05/26 0115)  Labs: Recent Labs    07/05/18 1747 07/05/18 2144 07/05/18 2320 07/06/18 0159  HGB 14.7  --   --   --   HCT 44.2  --   --   --   PLT 265  --   --   --   CREATININE 1.06*  --   --   --   TROPONINI  --  0.04* 0.04* 0.04*    Estimated Creatinine Clearance: 80.8 mL/min (A) (by C-G formula based on SCr of 1.06 mg/dL (H)).   Medical History: Past Medical History:  Diagnosis Date  . Alcohol abuse    abuse- moderate years ago - states only drinks now on weekends -2 to 8 driinks   . Asthma   . COLONIC POLYPS, HX OF 04/05/2010  . DIVERTICULITIS, HX OF 04/05/2010  . DJD (degenerative joint disease)    right knee, mot to severe  . GERD (gastroesophageal reflux disease)    no meds  . Heart murmur    hx of   . Hyperlipidemia   . Hypertension   . Impaired glucose tolerance 12/06/2013  . Obesity (BMI 30-39.9)   . PALPITATIONS, HX OF 09/14/2007    Medications:  Scheduled:  . sodium chloride flush  3 mL Intravenous Once    Assessment: 49 yof presenting with nausea - CTA concerning for acute PE (no evidence heart strain). No AC PTA.   Hgb 14.7, plt 265. D-dimer 0.88. Troponin 0.04. No s/sx of bleeding.   Goal of Therapy:  Heparin level 0.3-0.7 units/ml Monitor platelets by anticoagulation protocol: Yes   Plan:  Give 4500 units bolus x 1 Start heparin infusion at 1450 units/hr Check  anti-Xa level in 6 hours and daily while on heparin Continue to monitor H&H and platelets  Antonietta Jewel, PharmD, BCCCP Clinical Pharmacist  Pager: [phone] Phone: [phone] 07/06/2018,3:51 AM

## 2018-07-06 NOTE — Telephone Encounter (Signed)
I called pt and offered her an appt. Pt states that would be great.

## 2018-07-06 NOTE — Progress Notes (Signed)
[facility] for heparin Indication: pulmonary embolus  Allergies  Allergen Reactions  . Morphine Itching  . Shrimp [Shellfish Allergy] Itching and Other (See Comments)    Tongue burns also  . Tramadol Other (See Comments)    Caused confusion  . Latex Rash    Patient Measurements: Height: 5' 1.5" (156.2 cm) Weight: 291 lb 9.6 oz (132.3 kg)(scale b) IBW/kg (Calculated) : 48.95 Heparin Dosing Weight: 81.6 kg   Vital Signs: Temp: 98.5 F (36.9 C) (05/26 0753) Temp Source: Oral (05/26 0753) BP: 133/90 (05/26 0755) Pulse Rate: 123 (05/26 0755)  Labs: Recent Labs    07/05/18 1747  07/06/18 0408 07/06/18 0948 07/06/18 1623 07/06/18 1809  HGB 14.7  --  14.9  --   --   --   HCT 44.2  --  44.0  --   --   --   PLT 265  --  252  --   --   --   HEPARINUNFRC  --   --   --  0.15*  --  0.27*  CREATININE 1.06*  --  1.00  --   --   --   TROPONINI  --    < > 0.04* 0.03* 0.03*  --    < > = values in this interval not displayed.    Estimated Creatinine Clearance: 80.6 mL/min (by C-G formula based on SCr of 1 mg/dL).   Assessment: 74 yof presenting with nausea - CTA concerning for acute bilateral PE (no evidence heart strain). No AC PTA. LE duplex neg for DVT.  Repeat heparin level remains slightly below goal at 0.27.  Goal of Therapy:  Heparin level 0.3-0.7 units/ml Monitor platelets by anticoagulation protocol: Yes   Plan:  -Increase heparin to 2000 units/hr -Recheck heparin level in [place], PharmD, BCPS Clinical Pharmacist [phone] Please check AMION for all [place] numbers 07/06/2018

## 2018-07-06 NOTE — Progress Notes (Signed)
Patient seen and examined.  HPI reviewed.  Patient presented with some shortness of breath, nausea abdominal pain body pain. Diagnosed with bilateral PE. General; obese not acute distress anxious Cardiovascular; S 1, S 2  tachycardic Chest; lungs clear to auscultation  1-bilateral PE: Continue with IV heparin, follow 2D echo and Dopplers of the left lower extremity Extensive discussion with patient regarding risk factors and need for appropriate screening for age malignancy. Monitor blood pressure and oxygen sat.  2-anxiety start low-dose Xanax PRN.  3-Nausea vomiting, mild elevation of liver function test ultrasound unrevealing fatty liver. Repeat liver function test in a.m. follow hepatitis panel. start PPI twice daily.  Niel Hummer, MD.

## 2018-07-06 NOTE — TOC Benefit Eligibility Note (Signed)
Transition of Care [facility]) Benefit Eligibility Note    Patient Details  Name: [name] MRN: [id number] Date of Birth: Nov 21, 1960   Medication/Dose: Eliquis 5mg  BID     Prescription Coverage Preferred Pharmacy: Most major retail pharmacies  Spoke with Person/Company/Phone Number:: [employer]   Co-Pay: $35 for 30 day/ $105 90day mail order     Additional Notes: co-pay assistance card available    Delorse Lek Phone Number: 07/06/2018, 10:02 AM

## 2018-07-06 NOTE — Progress Notes (Signed)
[facility] for heparin Indication: pulmonary embolus  Allergies  Allergen Reactions  . Morphine Itching  . Shrimp [Shellfish Allergy] Itching and Other (See Comments)    Tongue burns also  . Tramadol Other (See Comments)    Caused confusion  . Latex Rash    Patient Measurements: Height: 5' 1.5" (156.2 cm) Weight: 291 lb 9.6 oz (132.3 kg)(scale b) IBW/kg (Calculated) : 48.95 Heparin Dosing Weight: 81.6 kg   Vital Signs: Temp: 98.5 F (36.9 C) (05/26 0753) Temp Source: Oral (05/26 0753) BP: 133/90 (05/26 0755) Pulse Rate: 123 (05/26 0755)  Labs: Recent Labs    07/05/18 1747  07/06/18 0159 07/06/18 0408 07/06/18 0948  HGB 14.7  --   --  14.9  --   HCT 44.2  --   --  44.0  --   PLT 265  --   --  252  --   HEPARINUNFRC  --   --   --   --  0.15*  CREATININE 1.06*  --   --  1.00  --   TROPONINI  --    < > 0.04* 0.04* 0.03*   < > = values in this interval not displayed.    Estimated Creatinine Clearance: 80.6 mL/min (by C-G formula based on SCr of 1 mg/dL).   Assessment: 50 yof presenting with nausea - CTA concerning for acute bilateral PE (no evidence heart strain). No AC PTA. LE duplex neg for DVT.  Heparin level is subtherapeutic at 0.15 on 1450 units/hr. No bleeding noted, CBC is stable. No problems with infusion or bleeding per RN.  Goal of Therapy:  Heparin level 0.3-0.7 units/ml Monitor platelets by anticoagulation protocol: Yes   Plan:  Heparin 2400 unit IV bolus then increase infusion to 1700 units/hr 6 hr heparin level Daily heparin level and CBC Monitor for s/sx of bleeding   Renold Genta, PharmD, BCPS Clinical Pharmacist Clinical phone for 07/06/2018 until 3p is [phone] 07/06/2018 11:16 AM  **Pharmacist phone directory can now be found on [email or web address] listed under [place]**

## 2018-07-06 NOTE — ED Notes (Signed)
Admitting provider bedside 

## 2018-07-06 NOTE — Telephone Encounter (Signed)
Pt called states she dropped a bottle of waters on her surgery foot, it doesn't have any swelling has lots of bruising, and she would like her pain medication.

## 2018-07-06 NOTE — Progress Notes (Signed)
LE venous duplex       has been completed. Preliminary results can be found under CV proc through chart review. Arta Stump, BS, RDMS, RVT   

## 2018-07-06 NOTE — ED Provider Notes (Addendum)
Care assumed from Dr. Regenia Skeeter.  Patient with history of morbid obesity here with nausea and burning in her chest.  Her EKG is nonischemic but she is awaiting troponin, LFTs and lipase.  Given her mild bilirubin elevation, right upper quadrant ultrasound was obtained which shows no gallstones. Troponin mildly elevated and d-dimer was obtained due to her tachycardia which was elevated as well.  She is found to have multiple subsegmental pulmonary emboli. D/w Dr. Nyoka Cowden. Liver lesions appear chronic and likely hemangiomas. Does have enlarged subcarinal lymph node that needs followup.  D/w Dr. Hal Hope. He will evaluate for admission. Start IV heparin.\  CRITICAL CARE Performed by: Ezequiel Essex Total critical care time: 35 minutes Critical care time was exclusive of separately billable procedures and treating other patients. Critical care was necessary to treat or prevent imminent or life-threatening deterioration. Critical care was time spent personally by me on the following activities: development of treatment plan with patient and/or surrogate as well as nursing, discussions with consultants, evaluation of patient's response to treatment, examination of patient, obtaining history from patient or surrogate, ordering and performing treatments and interventions, ordering and review of laboratory studies, ordering and review of radiographic studies, pulse oximetry and re-evaluation of patient's condition.    Ezequiel Essex, MD 07/06/18 4403    Ezequiel Essex, MD 07/06/18 2243

## 2018-07-06 NOTE — Progress Notes (Signed)
  Echocardiogram 2D Echocardiogram with definity has been performed.  [name] M 07/06/2018, 1:30 PM

## 2018-07-06 NOTE — H&P (Addendum)
History and Physical    [name] OFB:[id number] DOB: 13-Sep-1960 DOA: 07/05/2018  PCP: Biagio Borg, MD  Patient coming from: Home.  Chief Complaint: Nausea vomiting.  HPI: [name] is a 58 y.o. female with history of hypertension, hyperlipidemia, asthma previous history of alcohol abuse states he only drinks occasionally nowadays presents to the ER because of nausea vomiting with some epigastric discomfort and burning sensation in the chest over the last 3 days.  Denies any diarrhea or any sick contacts.  Denies any shortness of breath but patient states she did feel short of breath out of any mass.  Patient states he just recently had both a great toe nails removed in her left great toe nailbed has become more discolored but no discharge or pain.  ED Course: In the ER on exam patient was not hypoxic and abdomen appears benign.  Labs revealed EKG was showing sinus tachycardia.  Ultrasound abdomen was not showing anything acute.  Patient's troponin came mildly elevated with mildly elevated troponin d-dimer.  Patient continued to remain tachycardic.  Given the elevated d-dimer and tachycardia patient underwent CT angiogram of the chest which shows bilateral subsegmental pulmonary embolism with no strain pattern.  Patient was started on heparin and admitted for further work-up.  Labs also showed elevated LFTs with AST of 70 ALT of 55 bilirubin was 1.7.  CT angiogram of the chest done for PE also showed persistent lesion in the liver which as per the radiologist was possible hemangioma.  Review of Systems: As per HPI, rest all negative.   Past Medical History:  Diagnosis Date   Alcohol abuse    abuse- moderate years ago - states only drinks now on weekends -2 to 8 driinks    Asthma    COLONIC POLYPS, HX OF 04/05/2010   DIVERTICULITIS, HX OF 04/05/2010   DJD (degenerative joint disease)    right knee, mot to severe   GERD (gastroesophageal reflux disease)    no meds    Heart murmur    hx of    Hyperlipidemia    Hypertension    Impaired glucose tolerance 12/06/2013   Obesity (BMI 30-39.9)    PALPITATIONS, HX OF 09/14/2007    Past Surgical History:  Procedure Laterality Date   ABDOMINAL HYSTERECTOMY  age 34   fibroids   COLONOSCOPY WITH PROPOFOL N/A 07/25/2016   Procedure: COLONOSCOPY WITH PROPOFOL;  Surgeon: Carol Ada, MD;  Location: [facility];  Service: Endoscopy;  Laterality: N/A;   colonscopy     x 2   IR RADIOLOGIST EVAL & MGMT  08/12/2016   IR RADIOLOGIST EVAL & MGMT  08/21/2016   KNEE ARTHROSCOPY     left    TOTAL KNEE ARTHROPLASTY  07/29/2011   Procedure: TOTAL KNEE ARTHROPLASTY;  Surgeon: Mauri Pole, MD;  Location: [facility];  Service: Orthopedics;  Laterality: Right;   TOTAL KNEE ARTHROPLASTY Left 12/14/2012   Procedure: LEFT TOTAL KNEE ARTHROPLASTY;  Surgeon: Mauri Pole, MD;  Location: [facility];  Service: Orthopedics;  Laterality: Left;     reports that she quit smoking about 30 years ago. Her smoking use included cigarettes. She has a 3.50 pack-year smoking history. She has never used smokeless tobacco. She reports current alcohol use. She reports that she does not use drugs.  Allergies  Allergen Reactions   Morphine Itching   Shrimp [Shellfish Allergy] Itching and Other (See Comments)    Tongue burns also   Tramadol Other (See Comments)  Caused confusion   Latex Rash    Family History  Problem Relation Age of Onset   Stroke Mother    COPD Father    Lymphoma Sister     Prior to Admission medications   Medication Sig Start Date End Date Taking? Authorizing Provider  azelastine (ASTELIN) 0.1 % nasal spray Place 1 spray into both nostrils 2 (two) times daily. Use in each nostril as directed 01/06/18   Biagio Borg, MD  Azelastine-Fluticasone [facility]) [phone] MCG/ACT SUSP 1 spray each nostril twice per day 01/06/18   Biagio Borg, MD  Fluticasone-Salmeterol (ADVAIR DISKUS) 250-50 MCG/DOSE AEPB Inhale  1 puff into the lungs 2 (two) times daily. 09/22/17   Biagio Borg, MD  hydrochlorothiazide (HYDRODIURIL) 25 MG tablet Take 1 tablet (25 mg total) by mouth daily. 01/06/18   Biagio Borg, MD  HYDROcodone-acetaminophen (NORCO) 10-325 MG tablet Take 1 tablet by mouth every 8 (eight) hours as needed. 02/19/18   Wallene Huh, DPM  HYDROcodone-acetaminophen (NORCO/VICODIN) 5-325 MG tablet Take 1 tablet by mouth every 6 (six) hours as needed for moderate pain. 04/29/18   Regal, Tamala Fothergill, DPM  LIPITOR 10 MG tablet TAKE 1 TABLET BY MOUTH EVERY DAY Patient taking differently: Take 10 mg by mouth every morning.  02/15/18   Biagio Borg, MD  losartan (COZAAR) 100 MG tablet Take 1 tablet (100 mg total) by mouth daily. 01/06/18   Biagio Borg, MD  losartan-hydrochlorothiazide (HYZAAR) 100-25 MG tablet TAKE 1 TABLET BY MOUTH EVERY DAY Patient taking differently: Take 1 tablet by mouth daily.  04/14/18   Biagio Borg, MD  meloxicam (MOBIC) 15 MG tablet Take 1 tablet (15 mg total) by mouth daily. 02/19/18   Wallene Huh, DPM  neomycin-polymyxin-hydrocortisone (CORTISPORIN) OTIC solution Apply 1-2 drops to toe after soaking BID 06/16/18   Regal, Tamala Fothergill, DPM  potassium chloride SA (K-DUR,KLOR-CON) 20 MEQ tablet Take 1 tablet (20 mEq total) by mouth daily. Patient taking differently: Take 20 mEq by mouth daily as needed (low potassium).  09/22/17   Biagio Borg, MD  PROAIR HFA 108 [phone] Base) MCG/ACT inhaler INHALE 1 TO 2 PUFFS BY MOUTH EVERY 6 HOURS AS NEEDED FOR WHEEZE Patient taking differently: Inhale 1-2 puffs into the lungs every 6 (six) hours as needed for wheezing or shortness of breath.  02/19/18   Biagio Borg, MD  triamcinolone cream (KENALOG) 0.1 % APPLY TO AFFECTED AREA TWICE A DAY 06/11/18   Biagio Borg, MD    Physical Exam: Vitals:   07/06/18 0045 07/06/18 0100 07/06/18 0115 07/06/18 0300  BP: (!) 156/88 137/81 (!) 148/92   Pulse: (!) 107 (!) 107 (!) 115   Resp: (!) 26 18 (!) 24   Temp:        TempSrc:      SpO2: 98% 97% 96%   Weight:    (!) 140 kg  Height:    5\' 3"  (1.6 m)      Constitutional: Moderately built and nourished. Vitals:   07/06/18 0045 07/06/18 0100 07/06/18 0115 07/06/18 0300  BP: (!) 156/88 137/81 (!) 148/92   Pulse: (!) 107 (!) 107 (!) 115   Resp: (!) 26 18 (!) 24   Temp:      TempSrc:      SpO2: 98% 97% 96%   Weight:    (!) 140 kg  Height:    5\' 3"  (1.6 m)   Eyes: Anicteric no pallor. ENMT: No discharge from  the ears eyes nose and mouth. Neck: No mass felt.  No neck rigidity. Respiratory: No rhonchi or crepitations. Cardiovascular: S1-S2 heard. Abdomen: Soft nontender bowel sounds present. Musculoskeletal: No edema.  No joint effusion. Skin: Mild erythema of the left great toe.  From recent toenail removal. Neurologic: Alert awake oriented to time place and person.  Moves all extremities. Psychiatric: Appears normal per normal affect.   Labs on Admission: I have personally reviewed following labs and imaging studies  CBC: Recent Labs  Lab 07/05/18 1747  WBC 12.3*  HGB 14.7  HCT 44.2  MCV 88.4  PLT 121   Basic Metabolic Panel: Recent Labs  Lab 07/05/18 1747  NA 134*  K 3.7  CL 94*  CO2 23  GLUCOSE 98  BUN 13  CREATININE 1.06*  CALCIUM 9.6   GFR: Estimated Creatinine Clearance: 80.8 mL/min (A) (by C-G formula based on SCr of 1.06 mg/dL (H)). Liver Function Tests: Recent Labs  Lab 07/05/18 2144  AST 70*  ALT 55*  ALKPHOS 116  BILITOT 1.7*  PROT 7.6  ALBUMIN 3.6   Recent Labs  Lab 07/05/18 2144  LIPASE 25   No results for input(s): AMMONIA in the last 168 hours. Coagulation Profile: No results for input(s): INR, PROTIME in the last 168 hours. Cardiac Enzymes: Recent Labs  Lab 07/05/18 2144 07/05/18 2320 07/06/18 0159  TROPONINI 0.04* 0.04* 0.04*   BNP (last 3 results) No results for input(s): PROBNP in the last 8760 hours. HbA1C: No results for input(s): HGBA1C in the last 72 hours. CBG: No results  for input(s): GLUCAP in the last 168 hours. Lipid Profile: No results for input(s): CHOL, HDL, LDLCALC, TRIG, CHOLHDL, LDLDIRECT in the last 72 hours. Thyroid Function Tests: No results for input(s): TSH, T4TOTAL, FREET4, T3FREE, THYROIDAB in the last 72 hours. Anemia Panel: No results for input(s): VITAMINB12, FOLATE, FERRITIN, TIBC, IRON, RETICCTPCT in the last 72 hours. Urine analysis:    Component Value Date/Time   COLORURINE YELLOW 07/05/2018 2217   APPEARANCEUR CLEAR 07/05/2018 2217   LABSPEC 1.025 07/05/2018 2217   PHURINE 5.0 07/05/2018 2217   GLUCOSEU NEGATIVE 07/05/2018 2217   GLUCOSEU NEGATIVE 01/06/2018 1034   HGBUR SMALL (A) 07/05/2018 2217   BILIRUBINUR NEGATIVE 07/05/2018 2217   BILIRUBINUR negative 09/16/2011 1047   KETONESUR 20 (A) 07/05/2018 2217   PROTEINUR 30 (A) 07/05/2018 2217   UROBILINOGEN 0.2 01/06/2018 1034   NITRITE NEGATIVE 07/05/2018 2217   LEUKOCYTESUR NEGATIVE 07/05/2018 2217   Sepsis Labs: @LABRCNTIP (procalcitonin:4,lacticidven:4) )No results found for this or any previous visit (from the past 240 hour(s)).   Radiological Exams on Admission: Dg Chest 2 View  Result Date: 07/05/2018 CLINICAL DATA:  Nausea. EXAM: CHEST - 2 VIEW COMPARISON:  05/11/2018 FINDINGS: The lungs are clear without focal pneumonia, edema, pneumothorax or pleural effusion. The cardiopericardial silhouette is within normal limits for size. Prominent fat pad at the cardiac apex as confirmed by abdomen CT of 06/09/2018. The visualized bony structures of the thorax are intact. IMPRESSION: Stable.  No acute cardiopulmonary findings. Electronically Signed   By: Misty Stanley M.D.   On: 07/05/2018 18:35   Ct Angio Chest Pe W And/or Wo Contrast  Addendum Date: 07/06/2018   ADDENDUM REPORT: 07/06/2018 03:50 ADDENDUM: In comparison to prior CT dated 06/09/2018, the liver lesions appear stable and are consistent with benign hepatic hemangiomas. No further follow-up is required for these  liver lesions. Electronically Signed   By: Constance Holster M.D.   On: 07/06/2018 03:50  Result Date: 07/06/2018 CLINICAL DATA:  Nausea and vomiting.  Positive D-dimer. EXAM: CT ANGIOGRAPHY CHEST WITH CONTRAST TECHNIQUE: Multidetector CT imaging of the chest was performed using the standard protocol during bolus administration of intravenous contrast. Multiplanar CT image reconstructions and MIPs were obtained to evaluate the vascular anatomy. CONTRAST:  58mL OMNIPAQUE IOHEXOL 350 MG/ML SOLN COMPARISON:  None. FINDINGS: Cardiovascular: Examination is limited by patient body habitus and respiratory motion artifact. Findings are suspicious for multiple bilateral segmental and subsegmental pulmonary emboli. There is no CT evidence of right heart strain. The heart is enlarged. Coronary artery calcifications and aortic calcifications are noted. Mediastinum/Nodes: There is an enlarged 2.1 cm subcarinal lymph node. There are no pathologically enlarged supraclavicular or axillary lymph nodes. Lungs/Pleura: Lungs are clear. No pleural effusion or pneumothorax. The lung volumes are low. The trachea is unremarkable. Upper Abdomen: There multiple hyperattenuating or enhancing lesions throughout the liver. The largest is located at the dome and measures approximately 3.9 cm. There is a 3.2 cm hyperattenuating lesion in hepatic segment 2. Additional smaller lesions are noted throughout the right hepatic lobe. Musculoskeletal: No chest wall abnormality. No acute or significant osseous findings. Review of the MIP images confirms the above findings. IMPRESSION: 1. Examination limited by patient body habitus and motion artifact. Given these limitations, there appear to be a few segmental and subsegmental filling defects bilaterally concerning for acute pulmonary emboli, especially in the setting of a positive D-dimer. There is no CT evidence of heart strain. 2. Multiple hyperattenuating lesions throughout the liver measuring up  to approximately 3.9 cm. These are highly concerning for metastatic disease or primary liver cancer until proven otherwise. Multiple hepatic hemangiomas could have a similar appearance but this seems less likely. 3. Severe hepatic steatosis. 4. Enlarged subcarinal lymph node concerning for nodal metastatic disease. Cardiomegaly These results were called by telephone at the time of interpretation on 07/06/2018 at 3:29 am to Dr. Ezequiel Essex , who verbally acknowledged these results. Aortic Atherosclerosis (ICD10-I70.0). Electronically Signed: By: Constance Holster M.D. On: 07/06/2018 03:31   US Abdomen Limited Ruq  Result Date: 07/06/2018 CLINICAL DATA:  Right upper quadrant pain EXAM: ULTRASOUND ABDOMEN LIMITED RIGHT UPPER QUADRANT COMPARISON:  06/09/2018 FINDINGS: Gallbladder: No gallstones or wall thickening visualized. No sonographic Murphy sign noted by sonographer. Common bile duct: Diameter: 6 mm Liver: Mild increased echogenicity is noted consistent with fatty infiltration. Portal vein is patent on color Doppler imaging with normal direction of blood flow towards the liver. IMPRESSION: Fatty liver. Electronically Signed   By: Inez Catalina M.D.   On: 07/06/2018 02:09    EKG: Independently reviewed.  Sinus tachycardia.  Assessment/Plan Principal Problem:   Pulmonary embolism (HCC) Active Problems:   Extrinsic asthma   Hypertension   Elevated troponin    1. Acute bilateral pulmonary embolism no strain pattern per CT chest.  Troponin mildly elevated but denies any chest pain.  Will check 2D echo patient has been started on heparin which we will continue for now and if continues to remain stable may change to oral anticoagulants.  Check Dopplers of the lower extremity.  Has not had any previous history of PE or family history of PE or DVT. 2. Nausea vomiting with elevated LFTs -patient states she only rarely drinks alcohol nowadays.  Recheck LFTs ultrasound of the abdomen does not show  anything acute.  Abdominal exam appears benign.  Patient actually wants to eat now.  Could be gastroenteritis.  Check acute hepatitis panel.  Will have to hold  Lipitor if LFTs worsen. 3. Hypertension on ARB and hydrochlorothiazide.  Follow metabolic panel. 4. Elevated troponin check 2D echo no definite strain pattern in the CT chest.  Follow 2D echo for any strain pattern.  Patient is hemodynamically stable. 5. History of asthma on inhalers. 6. Hyperlipidemia on Lipitor which has to be held if LFTs worsen.   DVT prophylaxis: Heparin. Code Status: Full code. Family Communication: Discussed with patient. Disposition Plan: Home. Consults called: None. Admission status: Observation.   Rise Patience MD [facility] Pager [phone].  If 7PM-7AM, please contact night-coverage [email or web address] Password [email or web address]  07/06/2018, 4:04 AM

## 2018-07-06 NOTE — ED Notes (Signed)
Patient transported to CT 

## 2018-07-07 DIAGNOSIS — I10 Essential (primary) hypertension: Secondary | ICD-10-CM | POA: Diagnosis not present

## 2018-07-07 DIAGNOSIS — R7989 Other specified abnormal findings of blood chemistry: Secondary | ICD-10-CM | POA: Diagnosis not present

## 2018-07-07 DIAGNOSIS — J4520 Mild intermittent asthma, uncomplicated: Secondary | ICD-10-CM | POA: Diagnosis not present

## 2018-07-07 LAB — BASIC METABOLIC PANEL
Anion gap: 9 (ref 5–15)
BUN: 17 mg/dL (ref 6–20)
CO2: 27 mmol/L (ref 22–32)
Calcium: 9 mg/dL (ref 8.9–10.3)
Chloride: 102 mmol/L (ref 98–111)
Creatinine, Ser: 1.14 mg/dL — ABNORMAL HIGH (ref 0.44–1.00)
GFR calc Af Amer: >60 mL/min (ref >60)
GFR calc non Af Amer: 53 mL/min — ABNORMAL LOW (ref >60)
Glucose, Bld: 129 mg/dL — ABNORMAL HIGH (ref 70–99)
Potassium: 3.2 mmol/L — ABNORMAL LOW (ref 3.5–5.1)
Sodium: 138 mmol/L (ref 135–145)

## 2018-07-07 LAB — CBC
HCT: 42 % (ref 36.0–46.0)
Hemoglobin: 13.7 g/dL (ref 12.0–15.0)
MCH: 29.1 pg (ref 26.0–34.0)
MCHC: 32.6 g/dL (ref 30.0–36.0)
MCV: 89.2 fL (ref 80.0–100.0)
Platelets: 201 10*3/uL (ref 150–400)
RBC: 4.71 MIL/uL (ref 3.87–5.11)
RDW: 13.9 % (ref 11.5–15.5)
WBC: 9.9 10*3/uL (ref 4.0–10.5)
nRBC: 0 % (ref 0.0–0.2)

## 2018-07-07 LAB — HEPATITIS PANEL, ACUTE
HCV Ab: 0.5 s/co ratio (ref 0.0–0.9)
Hep A IgM: NEGATIVE
Hep B C IgM: NEGATIVE
Hepatitis B Surface Ag: NEGATIVE

## 2018-07-07 LAB — HEPARIN LEVEL (UNFRACTIONATED): Heparin Unfractionated: 0.41 IU/mL (ref 0.30–0.70)

## 2018-07-07 MED ORDER — APIXABAN 5 MG PO TABS
10.0000 mg | ORAL_TABLET | Freq: Once | ORAL | Status: AC
  Administered 2018-07-07: 10 mg via ORAL
  Filled 2018-07-07: qty 2

## 2018-07-07 MED ORDER — ELIQUIS 5 MG VTE STARTER PACK: ORAL_TABLET | ORAL | 0 refills | Status: DC

## 2018-07-07 MED FILL — ELIQUIS STARTER PACK 5 MG T: 5 | 30 days supply | Qty: 74 | Fill #0

## 2018-07-07 NOTE — Discharge Instructions (Signed)
Information on my medicine - ELIQUIS (apixaban)  This medication education was reviewed with me or my healthcare representative as part of my discharge preparation.  The pharmacist that spoke with me during my hospital stay was:  Merrianne Mccumbers Kay, RPH  Why was Eliquis prescribed for you? Eliquis was prescribed to treat blood clots that may have been found in the veins of your legs (deep vein thrombosis) or in your lungs (pulmonary embolism) and to reduce the risk of them occurring again.  What do You need to know about Eliquis ? The starting dose is 10 mg (two 5 mg tablets) taken TWICE daily for the FIRST SEVEN (7) DAYS, then  the dose is reduced to ONE 5 mg tablet taken TWICE daily.  Eliquis may be taken with or without food.   Try to take the dose about the same time in the morning and in the evening. If you have difficulty swallowing the tablet whole please discuss with your pharmacist how to take the medication safely.  Take Eliquis exactly as prescribed and DO NOT stop taking Eliquis without talking to the doctor who prescribed the medication.  Stopping may increase your risk of developing a new blood clot.  Refill your prescription before you run out.  After discharge, you should have regular check-up appointments with your healthcare provider that is prescribing your Eliquis.    What do you do if you miss a dose? If a dose of ELIQUIS is not taken at the scheduled time, take it as soon as possible on the same day and twice-daily administration should be resumed. The dose should not be doubled to make up for a missed dose.  Important Safety Information A possible side effect of Eliquis is bleeding. You should call your healthcare provider right away if you experience any of the following: Bleeding from an injury or your nose that does not stop. Unusual colored urine (red or dark brown) or unusual colored stools (red or black). Unusual bruising for unknown reasons. A serious  fall or if you hit your head (even if there is no bleeding).  Some medicines may interact with Eliquis and might increase your risk of bleeding or clotting while on Eliquis. To help avoid this, consult your healthcare provider or pharmacist prior to using any new prescription or non-prescription medications, including herbals, vitamins, non-steroidal anti-inflammatory drugs (NSAIDs) and supplements.  This website has more information on Eliquis (apixaban): http://www.eliquis.com/eliquis/home  

## 2018-07-07 NOTE — Progress Notes (Signed)
Patient discharged: Home with family  Via: Wheelchair   Discharge paperwork given: to patient and family Eliquis delivered to the patients room.   Reviewed with teach back  IV and telemetry disconnected  Belongings given to patient

## 2018-07-07 NOTE — Progress Notes (Signed)
[place] for heparin Indication: pulmonary embolus  Allergies  Allergen Reactions  . Morphine Itching  . Shrimp [Shellfish Allergy] Itching and Other (See Comments)    Tongue burns also  . Tramadol Other (See Comments)    Caused confusion  . Latex Rash    Patient Measurements: Height: 5' 1.5" (156.2 cm) Weight: 290 lb 3.2 oz (131.6 kg)(scale b) IBW/kg (Calculated) : 48.95 Heparin Dosing Weight: 81.6 kg   Vital Signs: Temp: 98.4 F (36.9 C) (05/27 0437) Temp Source: Oral (05/27 0437) BP: 158/100 (05/27 0816) Pulse Rate: 98 (05/27 0816)  Labs: Recent Labs    07/05/18 1747  07/06/18 0408 07/06/18 0948 07/06/18 1623 07/06/18 1809 07/07/18 0317  HGB 14.7  --  14.9  --   --   --  13.7  HCT 44.2  --  44.0  --   --   --  42.0  PLT 265  --  252  --   --   --  201  HEPARINUNFRC  --   --   --  0.15*  --  0.27* 0.41  CREATININE 1.06*  --  1.00  --   --   --  1.14*  TROPONINI  --    < > 0.04* 0.03* 0.03*  --   --    < > = values in this interval not displayed.    Estimated Creatinine Clearance: 70.5 mL/min (A) (by C-G formula based on SCr of 1.14 mg/dL (H)).   Assessment: 71 yof presenting with nausea - CTA concerning for acute bilateral PE (no evidence heart strain). No AC PTA. LE duplex neg for DVT.  Heparin level therapeutic CBC stable  Goal of Therapy:  Heparin level 0.3-0.7 units/ml Monitor platelets by anticoagulation protocol: Yes   Plan:  -Continue heparin to 2000 units/hr -Monitor daily HL, CBC, and for s/sx of bleeding.   Thank you Anette Guarneri, PharmD Please check AMION for all [facility] numbers 07/07/2018

## 2018-07-07 NOTE — Discharge Summary (Signed)
Physician Discharge Summary  [name] HWE:[id number] DOB: 1960/08/09 DOA: 07/05/2018  PCP: Biagio Borg, MD  Admit date: 07/05/2018 Discharge date: 07/07/2018  Admitted From: Observation Disposition: home  Recommendations for Outpatient Follow-up:  1. Follow up with PCP in 1-2 weeks 2. Please obtain BMP/CBC in one week 3. Please follow up on the following pending results:  Home Health:No Equipment/Devices:none  Discharge Condition:Stable CODE STATUS:Full code Diet recommendation: Cardiac diet  Brief/Interim Summary: Per admitting physician: [name] is a 58 y.o. female with history of hypertension, hyperlipidemia, asthma previous history of alcohol abuse states he only drinks occasionally nowadays presents to the ER because of nausea vomiting with some epigastric discomfort and burning sensation in the chest over the last 3 days.  Denies any diarrhea or any sick contacts.  Denies any shortness of breath but patient states she did feel short of breath out of any mass.  Patient states he just recently had both a great toe nails removed in her left great toe nailbed has become more discolored but no discharge or pain.  ED Course: In the ER on exam patient was not hypoxic and abdomen appears benign.  Labs revealed EKG was showing sinus tachycardia.  Ultrasound abdomen was not showing anything acute.  Patient's troponin came mildly elevated with mildly elevated troponin d-dimer.  Patient continued to remain tachycardic.  Given the elevated d-dimer and tachycardia patient underwent CT angiogram of the chest which shows bilateral subsegmental pulmonary embolism with no strain pattern.  Patient was started on heparin and admitted for further work-up.  Labs also showed elevated LFTs with AST of 70 ALT of 55 bilirubin was 1.7.  CT angiogram of the chest done for PE also showed persistent lesion in the liver which as per the radiologist was possible hemangioma.  Hospital  course: Pulmonary embolism with no strain pattern per CT chest.  Echocardiogram was done consistent with no severe strain noted, was started on a heparin drip and converted over to Eliquis on discharge.  Dopplers were checked which were negative.  Patient was counseled extensively on importance of compliance with medication and close follow-up with her PCP.  Nausea and vomiting with elevated LFTs.  Patient's nausea vomiting resolved when I took over her care.  She was eating diet only into the room without complications.  Did have abdomen was initially concerning for liver lesions but addendum showed these were unchanged from prior and thought to be benign hemangiomas.  Patient will follow closely with her outpatient PCP for continued surveillance.  Hypertension patient continue home medications without change.  Elevated troponin; these were marginally elevated at 0.04 and 0.03 the setting of PE.  They remained flat, no acute ischemic changes on EKG.  Patient will follow-up with her PCP for continued outpatient monitoring and further coronary stratification with referral to cardiology.   Discharge Diagnoses:  Principal Problem:   Pulmonary embolism [facility]) Active Problems:   Extrinsic asthma   Hypertension   Elevated troponin    Discharge Instructions  Discharge Instructions    Call MD for:   Complete by:  As directed    For any acute change in medical condition   Diet - low sodium heart healthy   Complete by:  As directed    Increase activity slowly   Complete by:  As directed      Allergies as of 07/07/2018      Reactions   Morphine Itching   Shrimp [shellfish Allergy] Itching, Other (See Comments)   Tongue  burns also   Tramadol Other (See Comments)   Caused confusion   Latex Rash      Medication List    TAKE these medications   azelastine 0.1 % nasal spray Commonly known as:  ASTELIN Place 1 spray into both nostrils 2 (two) times daily. Use in each nostril as directed    Azelastine-Fluticasone 137-50 MCG/ACT Susp Commonly known as:  Dymista 1 spray each nostril twice per day   Eliquis DVT/PE Starter Pack 5 MG Tabs Take as directed on package: start with two-5mg  tablets twice daily for 7 days. On day 8, switch to one-5mg  tablet twice daily.   Fluticasone-Salmeterol 250-50 MCG/DOSE Aepb Commonly known as:  Advair Diskus Inhale 1 puff into the lungs 2 (two) times daily.   hydrochlorothiazide 25 MG tablet Commonly known as:  HYDRODIURIL Take 1 tablet (25 mg total) by mouth daily.   HYDROcodone-acetaminophen 10-325 MG tablet Commonly known as:  NORCO Take 1 tablet by mouth every 8 (eight) hours as needed.   HYDROcodone-acetaminophen 5-325 MG tablet Commonly known as:  NORCO/VICODIN Take 1 tablet by mouth every 6 (six) hours as needed for moderate pain.   Lipitor 10 MG tablet Generic drug:  atorvastatin TAKE 1 TABLET BY MOUTH EVERY DAY What changed:    how much to take  when to take this   losartan 100 MG tablet Commonly known as:  COZAAR Take 1 tablet (100 mg total) by mouth daily.   losartan-hydrochlorothiazide 100-25 MG tablet Commonly known as:  HYZAAR TAKE 1 TABLET BY MOUTH EVERY DAY   meloxicam 15 MG tablet Commonly known as:  MOBIC Take 1 tablet (15 mg total) by mouth daily.   neomycin-polymyxin-hydrocortisone OTIC solution Commonly known as:  CORTISPORIN Apply 1-2 drops to toe after soaking BID   potassium chloride SA 20 MEQ tablet Commonly known as:  K-DUR Take 1 tablet (20 mEq total) by mouth daily.   ProAir HFA 108 (90 Base) MCG/ACT inhaler Generic drug:  albuterol INHALE 1 TO 2 PUFFS BY MOUTH EVERY 6 HOURS AS NEEDED FOR WHEEZE What changed:  See the new instructions.   triamcinolone cream 0.1 % Commonly known as:  KENALOG APPLY TO AFFECTED AREA TWICE A DAY What changed:  See the new instructions.      Follow-up Information    Biagio Borg, MD.   Specialties:  Internal Medicine, Radiology Contact  information: [address] [place] [address] [phone]          Allergies  Allergen Reactions  . Morphine Itching  . Shrimp [Shellfish Allergy] Itching and Other (See Comments)    Tongue burns also  . Tramadol Other (See Comments)    Caused confusion  . Latex Rash    Consultations:  None   Procedures/Studies: Dg Chest 2 View  Result Date: 07/05/2018 CLINICAL DATA:  Nausea. EXAM: CHEST - 2 VIEW COMPARISON:  05/11/2018 FINDINGS: The lungs are clear without focal pneumonia, edema, pneumothorax or pleural effusion. The cardiopericardial silhouette is within normal limits for size. Prominent fat pad at the cardiac apex as confirmed by abdomen CT of 06/09/2018. The visualized bony structures of the thorax are intact. IMPRESSION: Stable.  No acute cardiopulmonary findings. Electronically Signed   By: Misty Stanley M.D.   On: 07/05/2018 18:35   Dg Ankle Complete Right  Result Date: 06/14/2018 Please see detailed radiograph report in office note.  Ct Angio Chest Pe W And/or Wo Contrast  Addendum Date: 07/06/2018   ADDENDUM REPORT: 07/06/2018 03:50 ADDENDUM:  In comparison to prior CT dated 06/09/2018, the liver lesions appear stable and are consistent with benign hepatic hemangiomas. No further follow-up is required for these liver lesions. Electronically Signed   By: Constance Holster M.D.   On: 07/06/2018 03:50   Result Date: 07/06/2018 CLINICAL DATA:  Nausea and vomiting.  Positive D-dimer. EXAM: CT ANGIOGRAPHY CHEST WITH CONTRAST TECHNIQUE: Multidetector CT imaging of the chest was performed using the standard protocol during bolus administration of intravenous contrast. Multiplanar CT image reconstructions and MIPs were obtained to evaluate the vascular anatomy. CONTRAST:  38mL OMNIPAQUE IOHEXOL 350 MG/ML SOLN COMPARISON:  None. FINDINGS: Cardiovascular: Examination is limited by patient body habitus and respiratory motion artifact. Findings are suspicious for multiple  bilateral segmental and subsegmental pulmonary emboli. There is no CT evidence of right heart strain. The heart is enlarged. Coronary artery calcifications and aortic calcifications are noted. Mediastinum/Nodes: There is an enlarged 2.1 cm subcarinal lymph node. There are no pathologically enlarged supraclavicular or axillary lymph nodes. Lungs/Pleura: Lungs are clear. No pleural effusion or pneumothorax. The lung volumes are low. The trachea is unremarkable. Upper Abdomen: There multiple hyperattenuating or enhancing lesions throughout the liver. The largest is located at the dome and measures approximately 3.9 cm. There is a 3.2 cm hyperattenuating lesion in hepatic segment 2. Additional smaller lesions are noted throughout the right hepatic lobe. Musculoskeletal: No chest wall abnormality. No acute or significant osseous findings. Review of the MIP images confirms the above findings. IMPRESSION: 1. Examination limited by patient body habitus and motion artifact. Given these limitations, there appear to be a few segmental and subsegmental filling defects bilaterally concerning for acute pulmonary emboli, especially in the setting of a positive D-dimer. There is no CT evidence of heart strain. 2. Multiple hyperattenuating lesions throughout the liver measuring up to approximately 3.9 cm. These are highly concerning for metastatic disease or primary liver cancer until proven otherwise. Multiple hepatic hemangiomas could have a similar appearance but this seems less likely. 3. Severe hepatic steatosis. 4. Enlarged subcarinal lymph node concerning for nodal metastatic disease. Cardiomegaly These results were called by telephone at the time of interpretation on 07/06/2018 at 3:29 am to Dr. Ezequiel Essex , who verbally acknowledged these results. Aortic Atherosclerosis (ICD10-I70.0). Electronically Signed: By: Constance Holster M.D. On: 07/06/2018 03:31   Ct Abdomen Pelvis W Contrast  Result Date:  06/09/2018 CLINICAL DATA:  58 year old female with increasing vaginal pain and bleeding. History of colovaginal fistula from the sigmoid colon to the vaginal cuff. EXAM: CT ABDOMEN AND PELVIS WITH CONTRAST TECHNIQUE: Multidetector CT imaging of the abdomen and pelvis was performed using the standard protocol following bolus administration of intravenous contrast. CONTRAST:  5mL OMNIPAQUE IOHEXOL 300 MG/ML  SOLN COMPARISON:  Pelvis MRI 09/11/2017 and earlier. CT Abdomen and Pelvis 03/13/2017. FINDINGS: Lower chest: Stable cardiomegaly. No pericardial or pleural effusion. Partially visible mild peribronchial and peripheral ground-glass opacity in the right lower lobe is new since 2019. Stable left lung base. Hepatobiliary: Progressed hepatic steatosis since 2019. Chronic benign liver hemangiomas, the largest in the right lobe is stable measuring about 3.5 centimeters on series 3, image 11. negative gallbladder. Pancreas: Negative. Spleen: Negative. Adrenals/Urinary Tract: Normal adrenal glands. Bilateral renal enhancement is symmetric. 2-3 millimeter left lower pole renal calculi are stable. Progressed nephrograms but no renal contrast excretion on the delayed images. Decompressed ureters to the bladder. Diminutive urinary bladder. Dorsal bladder wall is inseparable from the chronic fistula tract further detailed below. Stomach/Bowel: Negative rectum. Unchanged appearance of the  sigmoid colon since 2019, with colovaginal fistula tract further described below. Chronic diverticulosis from the sigmoid through to the hepatic flexure. No active inflammation identified. Negative right colon and appendix. Negative terminal ileum. No dilated small bowel.  Decompressed stomach and duodenum. No free air, free fluid. Vascular/Lymphatic: Aortoiliac calcified atherosclerosis. Major arterial structures are patent. Portal venous system is patent. No lymphadenopathy. Reproductive: Continued band of soft tissue thickening tracking  from the vaginal cuff cephalad and to the left terminating along the left pelvic sidewall and inseparable from both the sigmoid colon and dorsal urinary bladder. This appears unchanged since the 2019 MRI. No gas within the vaginal fornix. No abscess by CT. Surgically absent uterus. The left ovary might also be inseparable from the fistula tract, unchanged. Negative right ovary. Other: No pelvic free fluid. Musculoskeletal: Flowing osteophytes in the lower thoracic spine with ankylosis. Multilevel lumbar vacuum disc. Multilevel lumbar facet arthropathy. No acute osseous abnormality identified. IMPRESSION: 1. Stable imaging appearance of the chronic colovaginal/colovesical fistula since 2019. 2. No inflammatory process in the abdomen or pelvis. Small nonspecific areas of ground-glass opacity at the right lung base are new but nonspecific. Query symptoms of respiratory infection. 3. No renal contrast excretion occurring on the delayed images. Query renal insufficiency. 4. Progressed hepatic steatosis since 2019. Chronic large bowel diverticulosis. Chronic left nephrolithiasis. Electronically Signed   By: Genevie Ann M.D.   On: 06/09/2018 02:20   Vas [place] Lower Extremity Venous (dvt)  Result Date: 07/06/2018  Lower Venous Study Risk Factors: Confirmed PE. Limitations: Body habitus. Performing Technologist: June Leap RDMS, RVT  Examination Guidelines: A complete evaluation includes B-mode imaging, spectral Doppler, color Doppler, and power Doppler as needed of all accessible portions of each vessel. Bilateral testing is considered an integral part of a complete examination. Limited examinations for reoccurring indications may be performed as noted.  +---------+---------------+---------+-----------+----------+--------------+ RIGHT    CompressibilityPhasicitySpontaneityPropertiesSummary        +---------+---------------+---------+-----------+----------+--------------+ CFV      Full           Yes      Yes                                  +---------+---------------+---------+-----------+----------+--------------+ SFJ      Full                                                        +---------+---------------+---------+-----------+----------+--------------+ FV Prox  Full                                                        +---------+---------------+---------+-----------+----------+--------------+ FV Mid   Full                                                        +---------+---------------+---------+-----------+----------+--------------+ FV Distal  Not visualized +---------+---------------+---------+-----------+----------+--------------+ PFV      Full                                                        +---------+---------------+---------+-----------+----------+--------------+ POP      Full           Yes      Yes                                 +---------+---------------+---------+-----------+----------+--------------+ PTV      Full                                                        +---------+---------------+---------+-----------+----------+--------------+ PERO                                                  Not visualized +---------+---------------+---------+-----------+----------+--------------+   +---------+---------------+---------+-----------+----------+--------------+ LEFT     CompressibilityPhasicitySpontaneityPropertiesSummary        +---------+---------------+---------+-----------+----------+--------------+ CFV      Full           Yes      Yes                                 +---------+---------------+---------+-----------+----------+--------------+ SFJ      Full                                                        +---------+---------------+---------+-----------+----------+--------------+ FV Prox  Full                                                         +---------+---------------+---------+-----------+----------+--------------+ FV Mid   Full                                                        +---------+---------------+---------+-----------+----------+--------------+ FV DistalFull                                                        +---------+---------------+---------+-----------+----------+--------------+ PFV      Full                                                        +---------+---------------+---------+-----------+----------+--------------+  POP      Full           Yes      Yes                                 +---------+---------------+---------+-----------+----------+--------------+ PTV      Full                                                        +---------+---------------+---------+-----------+----------+--------------+ PERO                                                  Not visualized +---------+---------------+---------+-----------+----------+--------------+     Summary: Right: There is no evidence of deep vein thrombosis in the lower extremity. However, portions of this examination were limited- see technologist comments above. No cystic structure found in the popliteal fossa. Left: There is no evidence of deep vein thrombosis in the lower extremity. However, portions of this examination were limited- see technologist comments above. No cystic structure found in the popliteal fossa.  *See table(s) above for measurements and observations. Electronically signed by Servando Snare MD on 07/06/2018 at 4:27:06 PM.    Final    US Abdomen Limited Ruq  Result Date: 07/06/2018 CLINICAL DATA:  Right upper quadrant pain EXAM: ULTRASOUND ABDOMEN LIMITED RIGHT UPPER QUADRANT COMPARISON:  06/09/2018 FINDINGS: Gallbladder: No gallstones or wall thickening visualized. No sonographic Murphy sign noted by sonographer. Common bile duct: Diameter: 6 mm Liver: Mild increased echogenicity is noted consistent with fatty  infiltration. Portal vein is patent on color Doppler imaging with normal direction of blood flow towards the liver. IMPRESSION: Fatty liver. Electronically Signed   By: Inez Catalina M.D.   On: 07/06/2018 02:09       Subjective: No acute events.  Patient resting comfortably in bedside chair. Hemodynamically stable Stable for discharge with conversion to p.o. anticoagulants  Discharge Exam: Vitals:   07/07/18 0816 07/07/18 1207  BP: (!) 158/100 (!) 135/95  Pulse: 98 (!) 102  Resp: 18 19  Temp:  98.4 F (36.9 C)  SpO2: 100% 99%   Vitals:   07/06/18 2002 07/07/18 0437 07/07/18 0816 07/07/18 1207  BP: (!) 154/89 (!) 141/95 (!) 158/100 (!) 135/95  Pulse: (!) 123 (!) 102 98 (!) 102  Resp: 18 18 18 19   Temp: 98.2 F (36.8 C) 98.4 F (36.9 C)  98.4 F (36.9 C)  TempSrc: Oral Oral  Oral  SpO2: 98% 98% 100% 99%  Weight:  131.6 kg    Height:        General: Pt is alert, awake, not in acute distress Cardiovascular: RRR, S1/S2 +, no rubs, no gallops Respiratory: CTA bilaterally, no wheezing, no rhonchi, good inspiratory effort Abdominal: Soft, NT, ND, bowel sounds + Extremities: no edema, no cyanosis    The results of significant diagnostics from this hospitalization (including imaging, microbiology, ancillary and laboratory) are listed below for reference.     Microbiology: Recent Results (from the past 240 hour(s))  SARS Coronavirus 2 (CEPHEID - Performed in [place] hospital lab), Hosp Order     Status: None   Collection Time:  07/06/18  5:08 AM  Result Value Ref Range Status   SARS Coronavirus 2 NEGATIVE NEGATIVE Final    Comment: (NOTE) If result is NEGATIVE SARS-CoV-2 target nucleic acids are NOT DETECTED. The SARS-CoV-2 RNA is generally detectable in upper and lower  respiratory specimens during the acute phase of infection. The lowest  concentration of SARS-CoV-2 viral copies this assay can detect is 250  copies / mL. A negative result does not preclude  SARS-CoV-2 infection  and should not be used as the sole basis for treatment or other  patient management decisions.  A negative result may occur with  improper specimen collection / handling, submission of specimen other  than nasopharyngeal swab, presence of viral mutation(s) within the  areas targeted by this assay, and inadequate number of viral copies  (<250 copies / mL). A negative result must be combined with clinical  observations, patient history, and epidemiological information. If result is POSITIVE SARS-CoV-2 target nucleic acids are DETECTED. The SARS-CoV-2 RNA is generally detectable in upper and lower  respiratory specimens dur ing the acute phase of infection.  Positive  results are indicative of active infection with SARS-CoV-2.  Clinical  correlation with patient history and other diagnostic information is  necessary to determine patient infection status.  Positive results do  not rule out bacterial infection or co-infection with other viruses. If result is PRESUMPTIVE POSTIVE SARS-CoV-2 nucleic acids MAY BE PRESENT.   A presumptive positive result was obtained on the submitted specimen  and confirmed on repeat testing.  While 2019 novel coronavirus  (SARS-CoV-2) nucleic acids may be present in the submitted sample  additional confirmatory testing may be necessary for epidemiological  and / or clinical management purposes  to differentiate between  SARS-CoV-2 and other Sarbecovirus currently known to infect humans.  If clinically indicated additional testing with an alternate test  methodology [phone]) is advised. The SARS-CoV-2 RNA is generally  detectable in upper and lower respiratory sp ecimens during the acute  phase of infection. The expected result is Negative. Fact Sheet for Patients:  StrictlyIdeas.no Fact Sheet for Healthcare Providers: BankingDealers.co.za This test is not yet approved or cleared by the  [place] FDA and has been authorized for detection and/or diagnosis of SARS-CoV-2 by FDA under an Emergency Use Authorization (EUA).  This EUA will remain in effect (meaning this test can be used) for the duration of the COVID-19 declaration under Section 564(b)(1) of the Act, 21 U.S.C. section 360bbb-3(b)(1), unless the authorization is terminated or revoked sooner. Performed at [facility], [place] [address], [place], [place] [address]      Labs: BNP (last 3 results) Recent Labs    05/11/18 0914  BNP 53.9   Basic Metabolic Panel: Recent Labs  Lab 07/05/18 1747 07/06/18 0408 07/07/18 0317  NA 134* 135 138  K 3.7 3.3* 3.2*  CL 94* 95* 102  CO2 23 24 27   GLUCOSE 98 104* 129*  BUN 13 13 17   CREATININE 1.06* 1.00 1.14*  CALCIUM 9.6 9.8 9.0   Liver Function Tests: Recent Labs  Lab 07/05/18 2144 07/06/18 0948  AST 70* 80*  ALT 55* 59*  ALKPHOS 116 105  BILITOT 1.7* 1.1  PROT 7.6 7.3  ALBUMIN 3.6 3.4*   Recent Labs  Lab 07/05/18 2144 07/06/18 0948  LIPASE 25 33   No results for input(s): AMMONIA in the last 168 hours. CBC: Recent Labs  Lab 07/05/18 1747 07/06/18 0408 07/07/18 0317  WBC 12.3* 11.2* 9.9  HGB 14.7  14.9 13.7  HCT 44.2 44.0 42.0  MCV 88.4 87.6 89.2  PLT 265 252 201   Cardiac Enzymes: Recent Labs  Lab 07/05/18 2320 07/06/18 0159 07/06/18 0408 07/06/18 0948 07/06/18 1623  TROPONINI 0.04* 0.04* 0.04* 0.03* 0.03*   BNP: Invalid input(s): POCBNP CBG: Recent Labs  Lab 07/06/18 0602 07/06/18 1708  GLUCAP 115* 199*   D-Dimer Recent Labs    07/06/18 0159  DDIMER 0.88*   Hgb A1c No results for input(s): HGBA1C in the last 72 hours. Lipid Profile No results for input(s): CHOL, HDL, LDLCALC, TRIG, CHOLHDL, LDLDIRECT in the last 72 hours. Thyroid function studies No results for input(s): TSH, T4TOTAL, T3FREE, THYROIDAB in the last 72 hours.  Invalid input(s): FREET3 Anemia work up No results for input(s): VITAMINB12,  FOLATE, FERRITIN, TIBC, IRON, RETICCTPCT in the last 72 hours. Urinalysis    Component Value Date/Time   COLORURINE YELLOW 07/05/2018 2217   APPEARANCEUR CLEAR 07/05/2018 2217   LABSPEC 1.025 07/05/2018 2217   PHURINE 5.0 07/05/2018 2217   GLUCOSEU NEGATIVE 07/05/2018 2217   GLUCOSEU NEGATIVE 01/06/2018 1034   HGBUR SMALL (A) 07/05/2018 2217   BILIRUBINUR NEGATIVE 07/05/2018 2217   BILIRUBINUR negative 09/16/2011 1047   KETONESUR 20 (A) 07/05/2018 2217   PROTEINUR 30 (A) 07/05/2018 2217   UROBILINOGEN 0.2 01/06/2018 1034   NITRITE NEGATIVE 07/05/2018 2217   LEUKOCYTESUR NEGATIVE 07/05/2018 2217   Sepsis Labs Invalid input(s): PROCALCITONIN,  WBC,  LACTICIDVEN Microbiology Recent Results (from the past 240 hour(s))  SARS Coronavirus 2 (CEPHEID - Performed in [place] hospital lab), Hosp Order     Status: None   Collection Time: 07/06/18  5:08 AM  Result Value Ref Range Status   SARS Coronavirus 2 NEGATIVE NEGATIVE Final    Comment: (NOTE) If result is NEGATIVE SARS-CoV-2 target nucleic acids are NOT DETECTED. The SARS-CoV-2 RNA is generally detectable in upper and lower  respiratory specimens during the acute phase of infection. The lowest  concentration of SARS-CoV-2 viral copies this assay can detect is 250  copies / mL. A negative result does not preclude SARS-CoV-2 infection  and should not be used as the sole basis for treatment or other  patient management decisions.  A negative result may occur with  improper specimen collection / handling, submission of specimen other  than nasopharyngeal swab, presence of viral mutation(s) within the  areas targeted by this assay, and inadequate number of viral copies  (<250 copies / mL). A negative result must be combined with clinical  observations, patient history, and epidemiological information. If result is POSITIVE SARS-CoV-2 target nucleic acids are DETECTED. The SARS-CoV-2 RNA is generally detectable in upper and lower   respiratory specimens dur ing the acute phase of infection.  Positive  results are indicative of active infection with SARS-CoV-2.  Clinical  correlation with patient history and other diagnostic information is  necessary to determine patient infection status.  Positive results do  not rule out bacterial infection or co-infection with other viruses. If result is PRESUMPTIVE POSTIVE SARS-CoV-2 nucleic acids MAY BE PRESENT.   A presumptive positive result was obtained on the submitted specimen  and confirmed on repeat testing.  While 2019 novel coronavirus  (SARS-CoV-2) nucleic acids may be present in the submitted sample  additional confirmatory testing may be necessary for epidemiological  and / or clinical management purposes  to differentiate between  SARS-CoV-2 and other Sarbecovirus currently known to infect humans.  If clinically indicated additional testing with an alternate test  methodology (  [id number]) is advised. The SARS-CoV-2 RNA is generally  detectable in upper and lower respiratory sp ecimens during the acute  phase of infection. The expected result is Negative. Fact Sheet for Patients:  StrictlyIdeas.no Fact Sheet for Healthcare Providers: BankingDealers.co.za This test is not yet approved or cleared by the [place] FDA and has been authorized for detection and/or diagnosis of SARS-CoV-2 by FDA under an Emergency Use Authorization (EUA).  This EUA will remain in effect (meaning this test can be used) for the duration of the COVID-19 declaration under Section 564(b)(1) of the Act, 21 U.S.C. section 360bbb-3(b)(1), unless the authorization is terminated or revoked sooner. Performed at [facility], [place] [address], [place], [place] [address]      Time coordinating discharge: 35 minutes  SIGNED:   Nicolette Bang, MD  [facility] 07/07/2018, 1:41 PM Pager   If 7PM-7AM, please contact  night-coverage [email or web address] Password [email or web address]

## 2018-07-07 NOTE — Progress Notes (Signed)
[place] for heparin Indication: pulmonary embolus  Allergies  Allergen Reactions  . Morphine Itching  . Shrimp [Shellfish Allergy] Itching and Other (See Comments)    Tongue burns also  . Tramadol Other (See Comments)    Caused confusion  . Latex Rash    Patient Measurements: Height: 5' 1.5" (156.2 cm) Weight: 290 lb 3.2 oz (131.6 kg)(scale b) IBW/kg (Calculated) : 48.95 Heparin Dosing Weight: 81.6 kg   Vital Signs: Temp: 98.4 F (36.9 C) (05/27 0437) Temp Source: Oral (05/27 0437) BP: 141/95 (05/27 0437) Pulse Rate: 102 (05/27 0437)  Labs: Recent Labs    07/05/18 1747  07/06/18 0408 07/06/18 0948 07/06/18 1623 07/06/18 1809 07/07/18 0317  HGB 14.7  --  14.9  --   --   --  13.7  HCT 44.2  --  44.0  --   --   --  42.0  PLT 265  --  252  --   --   --  201  HEPARINUNFRC  --   --   --  0.15*  --  0.27* 0.41  CREATININE 1.06*  --  1.00  --   --   --  1.14*  TROPONINI  --    < > 0.04* 0.03* 0.03*  --   --    < > = values in this interval not displayed.    Estimated Creatinine Clearance: 70.5 mL/min (A) (by C-G formula based on SCr of 1.14 mg/dL (H)).   Assessment: 80 yof presenting with nausea - CTA concerning for acute bilateral PE (no evidence heart strain). No AC PTA. LE duplex neg for DVT.  Heparin level this morning came back therapeutic at 0.41, on 2000 units/hr. Hgb 13.7, plt 201. No s/sx of bleeding. No infusion issues.   Goal of Therapy:  Heparin level 0.3-0.7 units/ml Monitor platelets by anticoagulation protocol: Yes   Plan:  -Continue heparin to 2000 units/hr -Monitor daily HL, CBC, and for s/sx of bleeding.   Antonietta Jewel, PharmD, BCCCP Clinical Pharmacist  Pager: [phone] Phone: [phone] Please check AMION for all [place] numbers 07/07/2018

## 2018-07-08 ENCOUNTER — Telehealth: Payer: Self-pay

## 2018-07-08 DIAGNOSIS — I2699 Other pulmonary embolism without acute cor pulmonale: Secondary | ICD-10-CM

## 2018-07-08 NOTE — Addendum Note (Signed)
Addended by: Biagio Borg on: 07/08/2018 01:05 PM   Modules accepted: Orders

## 2018-07-08 NOTE — Telephone Encounter (Signed)
Copied from [place] [id number]. Topic: General - Other >> Jul 08, 2018 11:07 AM Celene Kras A wrote: Reason for CRM: Pt called and is requesting to be referred to a blood clot specialist in her insurance network. Pt is requesting to have the information sent to her through mychart. Please advise.

## 2018-07-08 NOTE — Telephone Encounter (Signed)
Ok, this is done 

## 2018-07-09 ENCOUNTER — Ambulatory Visit (INDEPENDENT_AMBULATORY_CARE_PROVIDER_SITE_OTHER): Payer: BLUE CROSS/BLUE SHIELD | Admitting: Podiatry

## 2018-07-09 ENCOUNTER — Other Ambulatory Visit: Payer: Self-pay

## 2018-07-09 ENCOUNTER — Encounter: Payer: Self-pay | Admitting: Podiatry

## 2018-07-09 DIAGNOSIS — S9030XA Contusion of unspecified foot, initial encounter: Secondary | ICD-10-CM

## 2018-07-09 NOTE — Telephone Encounter (Signed)
Patient calling to clarify that she was wanting a referral to pulmonary specialist. She also inquired if Dr. Jenny Reichmann would be willing to call her, at his convenience, to discuss further.

## 2018-07-10 NOTE — Progress Notes (Signed)
Subjective:   Patient ID: [name], female   DOB: 58 y.o.   MRN: [id number]   HPI Patient presents stating she dropped something on her left toes and she is concerned that she might have traumatized the nailbeds that we had removed previously with some drainage noted   ROS      Objective:  Physical Exam  Neurovascular status intact with patient's left hallux and second toe being mildly swollen with discoloration with the nailbed slightly draining but I did not note any proximal edema erythema noted     Assessment:  Probability for nail trauma and irritation but does not appear to be infected     Plan:  Reviewed condition and do not recommend treatment except for continued soaks bandage usage and if any other changes were to occur let us know but does not appear to be any kind of bone irritation and appears to be soft tissue contusion

## 2018-07-12 ENCOUNTER — Telehealth: Payer: Self-pay | Admitting: Podiatry

## 2018-07-12 ENCOUNTER — Telehealth: Payer: Self-pay | Admitting: Internal Medicine

## 2018-07-12 NOTE — Telephone Encounter (Signed)
Pt first day to return to work/ right foot has plantars fasc/she is on blood thinners/ she can't take otc pain medication. Please call patient she is in a lot of pain.

## 2018-07-12 NOTE — Telephone Encounter (Signed)
A new hem appt has been scheduled for the pt to see Dr. Walden Field on 6/17 at 1050am. Pt agreed to the appt date and time.

## 2018-07-13 ENCOUNTER — Telehealth: Payer: Self-pay

## 2018-07-13 NOTE — Telephone Encounter (Signed)
Pt called following up on previous message. Pt is still experiencing severe pain and would like to know what she can do to help with the pain.

## 2018-07-13 NOTE — Telephone Encounter (Signed)
Called pt, LVM.   n the 5/28 telephone encounter pt requested a referral to a "blood clot specialist". So PCP referred her to hematology (located at the cancer center) as well as pulmonary.    Copied from [place] [id number]. Topic: General - Other >> Jul 12, 2018  4:31 PM Nils Flack, Marland Kitchen wrote: Reason for CRM: pt got a call from the cancer center and has no idea what it is for.  She would like explanation.  She is now concerned.  Please call back  [phone]

## 2018-07-13 NOTE — Telephone Encounter (Signed)
Left message for pt to make an appt again if she was continuing to have pain, that Dr. Paulla Dolly had stated pt's pain is above the perimeters for this type of injury and should be reevaluated and to call for an appt.

## 2018-07-14 ENCOUNTER — Telehealth: Payer: Self-pay | Admitting: Internal Medicine

## 2018-07-14 ENCOUNTER — Ambulatory Visit: Payer: BC Managed Care – PPO | Admitting: Internal Medicine

## 2018-07-14 NOTE — Telephone Encounter (Signed)
Left message informing pt of Dr. Mellody Drown orders to make an appt.

## 2018-07-14 NOTE — Telephone Encounter (Signed)
Called patient and made aware she would have to have labs drawn per hematology. She has appt at hematologist 6/17 and re-establish here on same day. Pt is trying to not have to go to hematologist. She was referred there by pcp. Informed pt this was 2 separate specialist. Nothing further needed.

## 2018-07-14 NOTE — Telephone Encounter (Signed)
Pt want's the nurse to call her back on [phone]

## 2018-07-14 NOTE — Telephone Encounter (Signed)
She should come in

## 2018-07-23 ENCOUNTER — Institutional Professional Consult (permissible substitution): Payer: BLUE CROSS/BLUE SHIELD | Admitting: Internal Medicine

## 2018-07-26 ENCOUNTER — Other Ambulatory Visit: Payer: Self-pay | Admitting: Internal Medicine

## 2018-07-26 DIAGNOSIS — Z1231 Encounter for screening mammogram for malignant neoplasm of breast: Secondary | ICD-10-CM

## 2018-07-28 ENCOUNTER — Encounter: Payer: Self-pay | Admitting: Podiatry

## 2018-07-28 ENCOUNTER — Ambulatory Visit
Admission: RE | Admit: 2018-07-28 | Discharge: 2018-07-28 | Disposition: A | Discharge status: Home / Self Care | Payer: BC Managed Care – PPO | Source: Ambulatory Visit | Attending: Internal Medicine | Admitting: Internal Medicine

## 2018-07-28 ENCOUNTER — Inpatient Hospital Stay: Payer: BC Managed Care – PPO

## 2018-07-28 ENCOUNTER — Inpatient Hospital Stay: Payer: BC Managed Care – PPO | Attending: Internal Medicine | Admitting: Internal Medicine

## 2018-07-28 ENCOUNTER — Ambulatory Visit (INDEPENDENT_AMBULATORY_CARE_PROVIDER_SITE_OTHER): Payer: BC Managed Care – PPO | Admitting: Internal Medicine

## 2018-07-28 ENCOUNTER — Other Ambulatory Visit: Payer: Self-pay

## 2018-07-28 ENCOUNTER — Encounter: Payer: Self-pay | Admitting: Internal Medicine

## 2018-07-28 ENCOUNTER — Ambulatory Visit (INDEPENDENT_AMBULATORY_CARE_PROVIDER_SITE_OTHER): Payer: BC Managed Care – PPO | Admitting: Podiatry

## 2018-07-28 VITALS — Temp 97.3°F

## 2018-07-28 DIAGNOSIS — M76821 Posterior tibial tendinitis, right leg: Secondary | ICD-10-CM | POA: Diagnosis not present

## 2018-07-28 DIAGNOSIS — J4530 Mild persistent asthma, uncomplicated: Secondary | ICD-10-CM | POA: Diagnosis not present

## 2018-07-28 DIAGNOSIS — M722 Plantar fascial fibromatosis: Secondary | ICD-10-CM

## 2018-07-28 DIAGNOSIS — I2699 Other pulmonary embolism without acute cor pulmonale: Secondary | ICD-10-CM

## 2018-07-28 DIAGNOSIS — L03032 Cellulitis of left toe: Secondary | ICD-10-CM | POA: Diagnosis not present

## 2018-07-28 DIAGNOSIS — Z1231 Encounter for screening mammogram for malignant neoplasm of breast: Secondary | ICD-10-CM

## 2018-07-28 MED ORDER — HYDROCODONE-ACETAMINOPHEN 10-325 MG PO TABS: 1.0000 | ORAL_TABLET | Freq: Three times a day (TID) | ORAL | 0 refills | Status: DC | PRN

## 2018-07-28 NOTE — Progress Notes (Signed)
Subjective:   Patient ID: [name], female   DOB: 58 y.o.   MRN: [id number]   HPI patient presents stating that her right heel has been bothering her and she did have a pulmonary embolism and was in the hospital for 3 days    ROS      Objective:  Physical Exam  Neurovascular status was found to be intact muscle strength is adequate with patient found to have discomfort in the bottom of the right heel of a moderate nature with flatfoot deformity noted and is noted to have orthotics which she has not started to wear yet     Assessment:  Reviewed pulmonary embolism with her and discussed condition and at this point will get a focus on the heel and I recommended orthotics I discussed shoe gear modifications possible insert therapy     Plan:  Fasciitis still present with pulmonary embolism of an acute nature

## 2018-07-28 NOTE — Progress Notes (Signed)
[name], female    DOB: [birth year]      MRN: [id number]   Brief patient profile:  68 yobf quit smoking in 1990 / [place]  remotely seen by Tia Masker on advair 250 one daily and freq albuterol and then R Plantar fascitis around march 2020 and then toe surgery both sides May 6th 2020 then more sob May 15th > admitted 07/05/2018   Note spirometry 11/2012 nl    Admit date: 07/05/2018 Discharge date: 07/07/2018   Discharge Diagnoses:      Pulmonary embolism ([place])    Extrinsic asthma   Hypertension   Elevated troponin  Brief/Interim Summary:  58 y.o.femalewithhistory of hypertension, hyperlipidemia, asthma previous history of alcohol abuse states she only drinks occasionally nowadays presented to the ER because of nausea vomiting with some epigastric discomfort and burning sensation in the chest over the last 3 days PTA.     just recently had both  great toe nails removed in her left great toe nailbed has become more discolored but no discharge or pain.  ED Course:  CT angiogram of the chest which shows bilateral subsegmental pulmonary embolism with no strain pattern. Patient was started on heparin and admitted for further work-up. Labs also showed elevated LFTs with AST of 70 ALT of 55 bilirubin was 1.7. CT angiogram of the chest done for PE also showed persistent lesion in the liver which as per the radiologist was possible hemangioma.  Hospital course: Pulmonary embolism with no strain pattern per CT chest.  Echocardiogram was done consistent with no severe strain noted, was started on a heparin drip and converted over to Eliquis on discharge.  Dopplers were checked which were negative but TDS.   Elevated troponin; these were marginally elevated at 0.04 and 0.03 the setting of PE.  They remained flat, no acute ischemic changes on EKG.  Patient will follow-up with her PCP for continued outpatient monitoring and further coronary stratification with referral to cardiology.     History of Present Illness  07/28/2018  Pulmonary/ 1st office eval/[name] re-establish re chronic doe/new pe Chief Complaint  Patient presents with  . Consult    Pulmonary Embolism-H/O asthma has not needed rescue inhaler.  Dyspnea:  MMRC1 = can walk nl pace, flat grade, can't hurry or go uphills or steps s sob   Cough: none Sleep: on back / 2 pillows  SABA use: none on advair one daily   No obvious day to day or daytime variability or assoc excess/ purulent sputum or mucus plugs or hemoptysis or cp or chest tightness, subjective wheeze or overt sinus or hb symptoms.   Sleeping  without nocturnal  or early am exacerbation  of respiratory  c/o's or need for noct saba. Also denies any obvious fluctuation of symptoms with weather or environmental changes or other aggravating or alleviating factors except as outlined above   No unusual exposure hx or h/o childhood pna/ asthma or knowledge of premature birth.  Current Allergies, Complete Past Medical History, Past Surgical History, Family History, and Social History were reviewed in [employer] record.  ROS  The following are not active complaints unless bolded Hoarseness, sore throat, dysphagia, dental problems, itching, sneezing,  nasal congestion or discharge of excess mucus or purulent secretions, ear ache,   fever, chills, sweats, unintended wt loss or wt gain, classically pleuritic or exertional cp,  orthopnea pnd or arm/hand swelling  or leg swelling, presyncope, palpitations, abdominal pain, anorexia, nausea, vomiting, diarrhea  or change  in bowel habits or change in bladder habits, change in stools or change in urine, dysuria, hematuria,  rash, arthralgias, visual complaints, headache, numbness, weakness or ataxia or problems with walking or coordination,  change in mood or  memory.           Past Medical History:  Diagnosis Date  . Alcohol abuse    abuse- moderate years ago - states only drinks now on weekends -2  to 8 driinks   . Asthma   . COLONIC POLYPS, HX OF 04/05/2010  . DIVERTICULITIS, HX OF 04/05/2010  . DJD (degenerative joint disease)    right knee, mot to severe  . GERD (gastroesophageal reflux disease)    no meds  . Heart murmur    hx of   . Hyperlipidemia   . Hypertension   . Impaired glucose tolerance 12/06/2013  . Obesity (BMI 30-39.9)   . PALPITATIONS, HX OF 09/14/2007    Outpatient Medications Prior to Visit  Medication Sig Dispense Refill  . Eliquis DVT/PE Starter Pack (ELIQUIS STARTER PACK) 5 MG TABS Take as directed on package: start with two-5mg  tablets twice daily for 7 days. On day 8, switch to one-5mg  tablet twice daily. 1 each 0  . Fluticasone-Salmeterol (ADVAIR DISKUS) 250-50 MCG/DOSE AEPB Inhale 1 puff each am    180 each 3  . LIPITOR 10 MG tablet TAKE 1 TABLET BY MOUTH EVERY DAY 90 tablet 3  . losartan-hydrochlorothiazide (HYZAAR) 100-25 MG tablet TAKE 1 TABLET BY MOUTH EVERY DAY 90 tablet 1  . potassium chloride SA (K-DUR,KLOR-CON) 20 MEQ tablet Take 1 tablet (20 mEq total) by mouth daily. 30 tablet 2  . PROAIR HFA 108 (90 Base) MCG/ACT inhaler INHALE 1 TO 2 PUFFS BY MOUTH EVERY 6 HOURS AS NEEDED FOR WHEEZE 8.5 Inhaler 5  . triamcinolone cream (KENALOG) 0.1 % APPLY TO AFFECTED AREA TWICE A DAY 30 g 0  . azelastine (ASTELIN) 0.1 % nasal spray Place 1 spray into both nostrils 2 (two) times daily. Use in each nostril as directed (Patient not taking: Reported on 07/28/2018) 30 mL 12  . Azelastine-Fluticasone (DYMISTA) 137-50 MCG/ACT SUSP 1 spray each nostril twice per day (Patient not taking: Reported on 07/28/2018) 23 g 11  . Eliquis DVT/PE Starter Pack (ELIQUIS STARTER PACK) 5 MG TABS Take as directed on package: start with two-5mg  tablets twice daily for 7 days. On day 8, switch to one-5mg  tablet twice daily. (Patient not taking: Reported on 07/28/2018) 1 each 0  . hydrochlorothiazide (HYDRODIURIL) 25 MG tablet Take 1 tablet (25 mg total) by mouth daily. (Patient not taking:  Reported on 07/28/2018) 90 tablet 3  . HYDROcodone-acetaminophen (NORCO) 10-325 MG tablet Take 1 tablet by mouth every 8 (eight) hours as needed. (Patient not taking: Reported on 07/28/2018) 30 tablet 0  . HYDROcodone-acetaminophen (NORCO) 10-325 MG tablet Take 1 tablet by mouth every 8 (eight) hours as needed for up to 5 days. (Patient not taking: Reported on 07/28/2018) 15 tablet 0  . HYDROcodone-acetaminophen (NORCO/VICODIN) 5-325 MG tablet Take 1 tablet by mouth every 6 (six) hours as needed for moderate pain. (Patient not taking: Reported on 07/28/2018) 30 tablet 0  . losartan (COZAAR) 100 MG tablet Take 1 tablet (100 mg total) by mouth daily. (Patient not taking: Reported on 07/28/2018) 90 tablet 3  . meloxicam (MOBIC) 15 MG tablet Take 1 tablet (15 mg total) by mouth daily. (Patient not taking: Reported on 07/28/2018) 30 tablet 2  . neomycin-polymyxin-hydrocortisone (CORTISPORIN) OTIC solution Apply 1-2 drops to  toe after soaking BID (Patient not taking: Reported on 07/28/2018) 10 mL 1      Objective:     BP 134/82 (BP Location: Left Arm, Cuff Size: Large)   Pulse 96   Temp 97.9 F (36.6 C) (Oral)   Ht 5\' 1"  (1.549 m)   Wt 289 lb 3.2 oz (131.2 kg)   SpO2 100%   BMI 54.64 kg/m   SpO2: 100 % RA   Obese bf / R thigh lipoma   HEENT: nl dentition, turbinates bilaterally, and oropharynx. Nl external ear canals without cough reflex   NECK :  without JVD/Nodes/TM/ nl carotid upstrokes bilaterally   LUNGS: no acc muscle use,  Nl contour chest which is clear to A and P bilaterally without cough on insp or exp maneuvers   CV:  RRR  no s3 or murmur or increase in P2, and no edema   ABD:  Quite obese and nontender with nl inspiratory excursion in the supine position. No bruits or organomegaly appreciated, bowel sounds nl  MS:    ext warm without deformities, calf tenderness, cyanosis or clubbing No obvious joint restrictions   SKIN: warm and dry without lesions    NEURO:  alert,  approp, nl sensorium with  no motor or cerebellar deficits apparent.      I personally reviewed images and agree with radiology impression as follows:   Chest CTa 07/06/2018 1. Examination limited by patient body habitus and motion artifact. Given these limitations, there appear to be a few segmental and subsegmental filling defects bilaterally concerning for acute pulmonary emboli, especially in the setting of a positive D-dimer. There is no CT evidence of heart strain. 2. Multiple hyperattenuating lesions throughout the liver measuring up to approximately 3.9 cm. These are highly concerning for metastatic disease or primary liver cancer until proven otherwise. Multiple hepatic hemangiomas could have a similar appearance but this seems less likely. 3. Severe hepatic steatosis. 4. Enlarged subcarinal lymph node concerning for nodal metastatic disease. Cardiomegaly      Assessment   No problem-specific Assessment & Plan notes found for this encounter.     [name], MD 07/28/2018

## 2018-07-28 NOTE — Patient Instructions (Signed)
Call me if trouble getting the eliquis - you need to be on it for 6 full months   Weight control is simply a matter of calorie balance which needs to be tilted in your favor by eating less and exercising more.  To get the most out of exercise, you need to be continuously aware that you are short of breath, but never out of breath, for 30 minutes daily. As you improve, it will actually be easier for you to do the same amount of exercise  in  30 minutes so always push to the level where you are short of breath.  If this does not result in gradual weight reduction then I strongly recommend you see a nutritionist with a food diary x 2 weeks so that we can work out a negative calorie balance which is universally effective in steady weight loss programs.  Think of your calorie balance like you do your bank account where in this case you want the balance to go down so you must take in less calories than you burn up.  It's just that simple:  Hard to do, but easy to understand.  Good luck!    Please schedule a follow up visit in 5  months but call sooner if needed

## 2018-07-29 ENCOUNTER — Encounter: Payer: Self-pay | Admitting: Internal Medicine

## 2018-07-29 NOTE — Assessment & Plan Note (Signed)
See CTa  07/06/18 with small clot burden, no RH strain and venous dopplers neg but TDS in setting of immobility from quarantine for corona virus/ MO with recent foot surgery   - Rx eliquis x 6 months min then consider mod dosing unless turns around wt issues    pe clearly provoked in above setting but concerned with MO and TDS dopplers that we can't detect and r/o chronic dvt as substrate for recurrent pe here.   Interestingly says her breathing and perceived need for saba are both much better than they've been in years raising the issue in MO of chronic undetected pe mimicking asthma  Discussed in detail all the  indications, usual  risks and alternatives  relative to the benefits with patient who agrees to proceed with Rx as outlined.

## 2018-07-29 NOTE — Assessment & Plan Note (Addendum)
Body mass index is 54.64 kg/m.    Lab Results  Component Value Date   TSH 1.93 01/06/2018     Contributing to gerd risk/ doe/reviewed the need and the process to achieve and maintain neg calorie balance > defer f/u primary care including intermittently monitoring thyroid status    Total time devoted to counseling  > 50 % of initial 60 min office visit:  review case with pt/  device teaching which extended face to face time for this visit  discussion of options/alternatives/ personally creating written customized instructions  in presence of pt  then going over those specific  Instructions directly with the pt including how to use all of the meds but in particular covering each new medication in detail and the difference between the maintenance= "automatic" meds and the prns using an action plan format for the latter (If this problem/symptom => do that organization reading Left to right).  Please see AVS from this visit for a full list of these instructions which I personally wrote for this pt and  are unique to this visit.

## 2018-07-29 NOTE — Assessment & Plan Note (Addendum)
Asthma with atopic features and GERD as ppt factors in addition to [place]: 11/2012: FeV1 94% FVC 108% - maint on advair 250 one daily as of 07/28/2018    - The proper method of use, as well as anticipated side effects, of a metered-dose inhaler are discussed and demonstrated to the patient.    I was not previously convinced she had asthma but regardless at this point All goals of chronic asthma control met including optimal function and elimination of symptoms with minimal need for rescue therapy.  Contingencies discussed in full including contacting this office immediately if not controlling the symptoms using the rule of two's.     Would have low threshold to shift to hfa ics/laba in setting of concern with UACS   Upper airway cough syndrome (previously labeled PNDS),  is so named because it's frequently impossible to sort out how much is  CR/sinusitis with freq throat clearing (which can be related to primary GERD)   vs  causing  secondary (" extra esophageal")  GERD from wide swings in gastric pressure that occur with throat clearing, often  promoting self use of mint and menthol lozenges that reduce the lower esophageal sphincter tone and exacerbate the problem further in a cyclical fashion.   These are the same pts (now being labeled as having "irritable larynx syndrome" by some cough centers) who not infrequently have a history of having failed to tolerate ace inhibitors,  dry powder inhalers esp advair  or biphosphonates or report having atypical/extraesophageal reflux symptoms that don't respond to standard doses of PPI  and are easily confused as having aecopd or asthma flares by even experienced allergists/ pulmonologists (myself included).

## 2018-08-17 ENCOUNTER — Other Ambulatory Visit: Payer: Self-pay

## 2018-08-17 ENCOUNTER — Emergency Department (HOSPITAL_COMMUNITY): Payer: BC Managed Care – PPO

## 2018-08-17 ENCOUNTER — Encounter (HOSPITAL_COMMUNITY): Payer: Self-pay | Admitting: *Deleted

## 2018-08-17 ENCOUNTER — Emergency Department (HOSPITAL_COMMUNITY)
Admission: EM | Admit: 2018-08-17 | Discharge: 2018-08-18 | Disposition: A | Discharge status: Home / Self Care | Payer: BC Managed Care – PPO | Attending: Emergency Medicine | Admitting: Emergency Medicine

## 2018-08-17 DIAGNOSIS — Z91040 Latex allergy status: Secondary | ICD-10-CM | POA: Insufficient documentation

## 2018-08-17 DIAGNOSIS — Z87891 Personal history of nicotine dependence: Secondary | ICD-10-CM | POA: Insufficient documentation

## 2018-08-17 DIAGNOSIS — I10 Essential (primary) hypertension: Secondary | ICD-10-CM | POA: Diagnosis not present

## 2018-08-17 DIAGNOSIS — R079 Chest pain, unspecified: Secondary | ICD-10-CM | POA: Diagnosis not present

## 2018-08-17 DIAGNOSIS — Z96653 Presence of artificial knee joint, bilateral: Secondary | ICD-10-CM | POA: Diagnosis not present

## 2018-08-17 DIAGNOSIS — Z7901 Long term (current) use of anticoagulants: Secondary | ICD-10-CM | POA: Diagnosis not present

## 2018-08-17 DIAGNOSIS — R0789 Other chest pain: Secondary | ICD-10-CM | POA: Diagnosis not present

## 2018-08-17 DIAGNOSIS — Z79899 Other long term (current) drug therapy: Secondary | ICD-10-CM | POA: Diagnosis not present

## 2018-08-17 LAB — BASIC METABOLIC PANEL
Anion gap: 11 (ref 5–15)
BUN: 16 mg/dL (ref 6–20)
CO2: 24 mmol/L (ref 22–32)
Calcium: 9.8 mg/dL (ref 8.9–10.3)
Chloride: 103 mmol/L (ref 98–111)
Creatinine, Ser: 0.87 mg/dL (ref 0.44–1.00)
GFR calc Af Amer: >60 mL/min (ref >60)
GFR calc non Af Amer: >60 mL/min (ref >60)
Glucose, Bld: 104 mg/dL — ABNORMAL HIGH (ref 70–99)
Potassium: 3.2 mmol/L — ABNORMAL LOW (ref 3.5–5.1)
Sodium: 138 mmol/L (ref 135–145)

## 2018-08-17 LAB — TROPONIN I (HIGH SENSITIVITY): Troponin I (High Sensitivity): 17 ng/L (ref <18)

## 2018-08-17 LAB — CBC
HCT: 42.4 % (ref 36.0–46.0)
Hemoglobin: 14 g/dL (ref 12.0–15.0)
MCH: 29.8 pg (ref 26.0–34.0)
MCHC: 33 g/dL (ref 30.0–36.0)
MCV: 90.2 fL (ref 80.0–100.0)
Platelets: 260 10*3/uL (ref 150–400)
RBC: 4.7 MIL/uL (ref 3.87–5.11)
RDW: 14.6 % (ref 11.5–15.5)
WBC: 12.1 10*3/uL — ABNORMAL HIGH (ref 4.0–10.5)
nRBC: 0 % (ref 0.0–0.2)

## 2018-08-17 MED ORDER — SODIUM CHLORIDE 0.9% FLUSH: 3.0000 mL | Freq: Once | INTRAVENOUS | Status: DC

## 2018-08-17 NOTE — ED Triage Notes (Signed)
Pt says about 1 hour after eating breakfast this morning, she felt a tightness in her upper chest. She took some pepto bismol, helped for a little while, but then it started back again. No accompanying cough, shortness of breath, dizziness, or n/v. No fevers. Recent hx of PE, taking eliquis.

## 2018-08-18 ENCOUNTER — Telehealth: Payer: Self-pay | Admitting: Internal Medicine

## 2018-08-18 LAB — TROPONIN I (HIGH SENSITIVITY): Troponin I (High Sensitivity): 17 ng/L (ref <18)

## 2018-08-18 NOTE — Discharge Instructions (Signed)
Your work-up in the emergency department was reassuring.  We recommend that you continue your daily medications.  Follow-up with your primary care doctor for recheck.

## 2018-08-18 NOTE — Telephone Encounter (Signed)
Pt also says that she is low on her pro-air inhaler and also wanted dr to know that she had been in the hosp and that she wants him to like at those cxr  and let know if they were ok.[name]

## 2018-08-18 NOTE — ED Notes (Signed)
EKG at bedside

## 2018-08-18 NOTE — Telephone Encounter (Signed)
LMTCB x1 for pt.  

## 2018-08-18 NOTE — ED Provider Notes (Signed)
[facility] EMERGENCY DEPARTMENT Provider Note   CSN: [id number] Arrival date & time: 08/17/18  7829     History   Chief Complaint Chief Complaint  Patient presents with  . Chest Pain    HPI [name] is a 58 y.o. female.     58 year old female presents to the emergency department for evaluation of chest pain which began at 10:30 AM yesterday.  Symptoms started after eating an egg and tomato biscuit.  She describes the discomfort as central, pressure and burning.  This improved following use of Pepto-Bismol, though continued to wax and wane throughout the day resolving completely since 2100.  Patient denies any exertional component, associated diaphoresis, SOB, nausea, vomiting, lightheadedness, syncope.  She has been compliant with her Eliquis following diagnosis of PE at the end of May.  States she is a never smoker.  Denies known personal or FHx of CAD.   The history is provided by the patient. No language interpreter was used.  Chest Pain   Past Medical History:  Diagnosis Date  . Alcohol abuse    abuse- moderate years ago - states only drinks now on weekends -2 to 8 driinks   . Asthma   . COLONIC POLYPS, HX OF 04/05/2010  . DIVERTICULITIS, HX OF 04/05/2010  . DJD (degenerative joint disease)    right knee, mot to severe  . GERD (gastroesophageal reflux disease)    no meds  . Heart murmur    hx of   . Hyperlipidemia   . Hypertension   . Impaired glucose tolerance 12/06/2013  . Obesity (BMI 30-39.9)   . PALPITATIONS, HX OF 09/14/2007    Patient Active Problem List   Diagnosis Date Noted  . Pulmonary embolism ([place]) 07/06/2018  . Elevated troponin 07/06/2018  . Leukocytosis 09/22/2017  . Abnormal urine color 09/22/2017  . Rash 09/22/2017  . Sepsis ([place]) 09/12/2016  . Colovesical fistula 09/12/2016  . Hypertension 07/28/2016  . Alcohol abuse 07/28/2016  . Obesity (BMI 30-39.9) 07/28/2016  . Abscess of bladder 07/28/2016  . Colonic  diverticulum 07/28/2016  . Acute hypokalemia 07/28/2016  . Hyperlipidemia 07/10/2016  . Mass of left side of neck 07/10/2016  . Head and neck lymphadenopathy 07/10/2016  . Acute sinus infection 01/25/2016  . Eustachian tube disorder 01/25/2016  . Asthma exacerbation 05/18/2015  . Peripheral edema 03/16/2015  . Asthma 03/16/2015  . Right shoulder pain 03/16/2015  . Hypokalemia 10/04/2014  . Upper airway cough syndrome 10/03/2014  . Severe obesity (BMI >= 40) ([place]) 10/03/2014  . Lower back pain 05/25/2014  . Bilateral shoulder pain 05/25/2014  . Chest pain 04/14/2014  . Allergic rhinitis 12/06/2013  . Impaired glucose tolerance 12/06/2013  . Expected blood loss anemia 12/16/2012  . Morbid obesity ([place]) 12/15/2012  . S/P left TKA 12/14/2012  . Goiter 11/26/2012  . Preop exam for internal medicine 11/26/2012  . Diarrhea 02/25/2012  . Vaginal bleeding 04/30/2011  . Colon polyps 02/10/2011  . Eczema 02/10/2011  . Depression 02/10/2011  . Preventative health care 09/27/2010  . Anxiety state 04/05/2010  . DIVERTICULITIS, HX OF 04/05/2010  . PALPITATIONS, HX OF 09/14/2007  . Obesity 04/24/2007  . VOCAL CORD DISORDER 04/24/2007  . Extrinsic asthma 04/24/2007  . GERD 04/24/2007    Past Surgical History:  Procedure Laterality Date  . ABDOMINAL HYSTERECTOMY  age 73   fibroids  . COLONOSCOPY WITH PROPOFOL N/A 07/25/2016   Procedure: COLONOSCOPY WITH PROPOFOL;  Surgeon: Carol Ada, MD;  Location: [facility];  Service: Endoscopy;  Laterality: N/A;  . colonscopy     x 2  . IR RADIOLOGIST EVAL & MGMT  08/12/2016  . IR RADIOLOGIST EVAL & MGMT  08/21/2016  . KNEE ARTHROSCOPY     left   . TOTAL KNEE ARTHROPLASTY  07/29/2011   Procedure: TOTAL KNEE ARTHROPLASTY;  Surgeon: Mauri Pole, MD;  Location: [facility];  Service: Orthopedics;  Laterality: Right;  . TOTAL KNEE ARTHROPLASTY Left 12/14/2012   Procedure: LEFT TOTAL KNEE ARTHROPLASTY;  Surgeon: Mauri Pole, MD;  Location: [facility];   Service: Orthopedics;  Laterality: Left;     OB History    Gravida  1   Para  1   Term      Preterm      AB      Living  1     SAB      TAB      Ectopic      Multiple      Live Births               Home Medications    Prior to Admission medications   Medication Sig Start Date End Date Taking? Authorizing Provider  Eliquis DVT/PE Starter Pack (ELIQUIS STARTER PACK) 5 MG TABS Take as directed on package: start with two-5mg  tablets twice daily for 7 days. On day 8, switch to one-5mg  tablet twice daily. 07/07/18   Marcell Anger, MD  Fluticasone-Salmeterol (ADVAIR DISKUS) 250-50 MCG/DOSE AEPB Inhale 1 puff into the lungs 2 (two) times daily. 09/22/17   Biagio Borg, MD  LIPITOR 10 MG tablet TAKE 1 TABLET BY MOUTH EVERY DAY 02/15/18   Biagio Borg, MD  losartan-hydrochlorothiazide [facility]) 100-25 MG tablet TAKE 1 TABLET BY MOUTH EVERY DAY 04/14/18   Biagio Borg, MD  potassium chloride SA (K-DUR,KLOR-CON) 20 MEQ tablet Take 1 tablet (20 mEq total) by mouth daily. 09/22/17   Biagio Borg, MD  PROAIR HFA 108 [phone] Base) MCG/ACT inhaler INHALE 1 TO 2 PUFFS BY MOUTH EVERY 6 HOURS AS NEEDED FOR WHEEZE 02/19/18   Biagio Borg, MD  triamcinolone cream (KENALOG) 0.1 % APPLY TO AFFECTED AREA TWICE A DAY 06/11/18   Biagio Borg, MD    Family History Family History  Problem Relation Age of Onset  . Stroke Mother   . COPD Father   . Lymphoma Sister     Social History Social History   Tobacco Use  . Smoking status: Former Smoker    Packs/day: 0.50    Years: 7.00    Pack years: 3.50    Types: Cigarettes    Quit date: 02/11/1988    Years since quitting: 30.5  . Smokeless tobacco: Never Used  Substance Use Topics  . Alcohol use: Yes    Comment: Occas  . Drug use: No    Comment: smoked marajuania x 10 years.  Quit in 1985.     Allergies   Morphine, Shrimp [shellfish allergy], Tramadol, and Latex   Review of Systems Review of Systems  Cardiovascular:  Positive for chest pain.  Ten systems reviewed and are negative for acute change, except as noted in the HPI.    Physical Exam Updated Vital Signs BP (!) 143/94 (BP Location: Left Arm)   Pulse 84   Temp 98.4 F (36.9 C) (Oral)   Resp 18   Ht 5\' 2"  (1.575 m)   Wt 130.6 kg   SpO2 99%   BMI 52.68 kg/m   Physical  Exam Vitals signs and nursing note reviewed.  Constitutional:      General: She is not in acute distress.    Appearance: She is well-developed. She is not diaphoretic.     Comments: Obese female in NAD  HENT:     Head: Normocephalic and atraumatic.  Eyes:     General: No scleral icterus.    Conjunctiva/sclera: Conjunctivae normal.  Neck:     Musculoskeletal: Normal range of motion.  Cardiovascular:     Rate and Rhythm: Normal rate and regular rhythm.     Pulses: Normal pulses.  Pulmonary:     Effort: Pulmonary effort is normal. No respiratory distress.     Breath sounds: No stridor. No wheezing.     Comments: Respirations even and unlabored Musculoskeletal: Normal range of motion.  Skin:    General: Skin is warm and dry.     Coloration: Skin is not pale.     Findings: No erythema or rash.  Neurological:     General: No focal deficit present.     Mental Status: She is alert and oriented to person, place, and time.     Coordination: Coordination normal.     Comments: GCS 15. Moving all extremities spontaneously.  Psychiatric:        Behavior: Behavior normal.      ED Treatments / Results  Labs (all labs ordered are listed, but only abnormal results are displayed) Labs Reviewed  BASIC METABOLIC PANEL - Abnormal; Notable for the following components:      Result Value   Potassium 3.2 (*)    Glucose, Bld 104 (*)    All other components within normal limits  CBC - Abnormal; Notable for the following components:   WBC 12.1 (*)    All other components within normal limits  TROPONIN I (HIGH SENSITIVITY)  TROPONIN I (HIGH SENSITIVITY)    EKG EKG  Interpretation  Date/Time:  Wednesday August 18 2018 02:33:14 EDT Ventricular Rate:  89 PR Interval:  150 QRS Duration: 101 QT Interval:  394 QTC Calculation: 480 R Axis:   28 Text Interpretation:  Sinus rhythm Multiform ventricular premature complexes Borderline repolarization abnormality No significant change since last tracing Confirmed by Pryor Curia [phone]) on 08/18/2018 2:47:17 AM   Radiology Dg Chest 2 View  Result Date: 08/17/2018 CLINICAL DATA:  Chest discomfort EXAM: CHEST - 2 VIEW COMPARISON:  None. FINDINGS: The heart size and mediastinal contours are within normal limits. Both lungs are clear. The visualized skeletal structures are unremarkable. IMPRESSION: No active cardiopulmonary disease. Electronically Signed   By: Ulyses Jarred M.D.   On: 08/17/2018 20:00    Procedures Procedures (including critical care time)  Medications Ordered in ED Medications  sodium chloride flush (NS) 0.9 % injection 3 mL (has no administration in time range)    2:52 AM Per chest pain pathway, patient stable for close outpatient follow up. Symptoms less c/w ACS given onset after eating and improvement with Pepto Bismol. Burning, pressure-like discomfort more c/w episode of reflux.   Initial Impression / Assessment and Plan / ED Course  I have reviewed the triage vital signs and the nursing notes.  Pertinent labs & imaging results that were available during my care of the patient were reviewed by me and considered in my medical decision making (see chart for details).        Patient presents to the emergency department for evaluation of chest pain, onset at 1030 yesterday after eating an egg and tomato biscut; symptoms  resolved since 2100.  Hx of PE in May 2020 for which patient is on Eliquis.  She endorses full compliance with this medication.   EKG is without signs of acute ischemia and troponin negative x 2.  Chest x-ray without evidence of mediastinal widening to suggest dissection.   No pneumothorax, pneumonia, pleural effusion.  History more suggestive of episode of reflux precipitated by eating.  Have advised the patient to follow-up with her primary care doctor and to continue her daily prescribed medications.  Return precautions discussed and provided. Patient discharged in stable condition with no unaddressed concerns.   Final Clinical Impressions(s) / ED Diagnoses   Final diagnoses:  Nonspecific chest pain    ED Discharge Orders    None       Antonietta Breach, PA-C 08/18/18 0256    Ward, Delice Bison, DO 08/18/18 [phone]

## 2018-08-19 NOTE — Telephone Encounter (Signed)
LMTCB x2 for pt.  THE CELL PHONE NUMBER LISTED IN THE CHART DOES NOT BELONG TO THE PT.

## 2018-08-20 MED ORDER — APIXABAN 5 MG PO TABS: 5.0000 mg | ORAL_TABLET | Freq: Two times a day (BID) | ORAL | 5 refills | Status: DC

## 2018-08-20 NOTE — Telephone Encounter (Signed)
Spoke with the pt  She is asking for MW to review her cxr that she had in the hospital recently  She went with a panic attack and was having SOB  She states that she also needs a refill on her Eliquis- currently on starter pack per [facility]  Please advise what dose to send to pharm  Thanks

## 2018-08-20 NOTE — Telephone Encounter (Signed)
Pt returning call and can be reached @ [phone].[name]

## 2018-08-20 NOTE — Telephone Encounter (Signed)
ATC patient unable to reach left message left detailed message per DPR  Order placed sent to the [facility] for Eliquis 5mg  bid with 5 months refill  Nothing further needed

## 2018-08-20 NOTE — Telephone Encounter (Signed)
Ok to refill x 5 months total @  eliquis 5 mg bid

## 2018-08-23 ENCOUNTER — Telehealth: Payer: Self-pay | Admitting: Internal Medicine

## 2018-08-23 NOTE — Telephone Encounter (Signed)
Called and spoke with pt letting her know we sent Rx to pharmacy for the Eliquis.  Pt stated she went to the ED 7/7 due to having chest pain. Pt would like MW to review the ED visit as well as bloodwork and cxr that they did while she was in the ED to see if MW wants to give any recommendations.    Dr. Melvyn Novas, please advise on this for pt. Thanks!

## 2018-08-23 NOTE — Telephone Encounter (Signed)
Looks fine to me but if still having problems needs ov with all meds in hand by end of week

## 2018-08-24 ENCOUNTER — Telehealth: Payer: Self-pay | Admitting: *Deleted

## 2018-08-24 NOTE — Telephone Encounter (Signed)
Called and spoke with pt letting her know the info per MW in regards to her recent ED visit. Pt verbalized understanding. Nothing further needed.

## 2018-08-24 NOTE — Telephone Encounter (Signed)
Pt states she was seen in June and the foot still hurts and is on Eliquis and can not take Antiinflammatories due to a "blood clot in my lungs".

## 2018-08-24 NOTE — Telephone Encounter (Signed)
Pt is returning Emily's phone call. Pt states that you may leave a detailed message on [phone]  - DPR signed 08/09/18 " Patient authorizes detailed messages on home phone at [phone] and cell phone at [phone]."

## 2018-08-24 NOTE — Telephone Encounter (Signed)
Left message informing pt that if she was continuing to have pain, she should be seen in office again to discuss other treatment options and begin ice therapy at least 3-4 times day for 15-20 minutes/session protecting the skin from the ice with a light cloth, and to call for an appt.

## 2018-08-24 NOTE — Telephone Encounter (Signed)
Attempted to call pt but unable to reach. Left message for pt to return call. 

## 2018-09-05 ENCOUNTER — Other Ambulatory Visit: Payer: Self-pay

## 2018-09-05 ENCOUNTER — Emergency Department (HOSPITAL_COMMUNITY): Payer: BC Managed Care – PPO

## 2018-09-05 ENCOUNTER — Encounter (HOSPITAL_COMMUNITY): Payer: Self-pay | Admitting: Emergency Medicine

## 2018-09-05 ENCOUNTER — Emergency Department (HOSPITAL_COMMUNITY)
Admission: EM | Admit: 2018-09-05 | Discharge: 2018-09-05 | Disposition: A | Discharge status: Home / Self Care | Payer: BC Managed Care – PPO | Attending: Emergency Medicine | Admitting: Emergency Medicine

## 2018-09-05 DIAGNOSIS — Z79899 Other long term (current) drug therapy: Secondary | ICD-10-CM | POA: Insufficient documentation

## 2018-09-05 DIAGNOSIS — I10 Essential (primary) hypertension: Secondary | ICD-10-CM | POA: Insufficient documentation

## 2018-09-05 DIAGNOSIS — Z91013 Allergy to seafood: Secondary | ICD-10-CM | POA: Diagnosis not present

## 2018-09-05 DIAGNOSIS — Z91040 Latex allergy status: Secondary | ICD-10-CM | POA: Diagnosis not present

## 2018-09-05 DIAGNOSIS — R000 Tachycardia, unspecified: Secondary | ICD-10-CM | POA: Diagnosis not present

## 2018-09-05 DIAGNOSIS — Z885 Allergy status to narcotic agent status: Secondary | ICD-10-CM | POA: Diagnosis not present

## 2018-09-05 DIAGNOSIS — Y906 Blood alcohol level of 120-199 mg/100 ml: Secondary | ICD-10-CM | POA: Insufficient documentation

## 2018-09-05 DIAGNOSIS — R110 Nausea: Secondary | ICD-10-CM | POA: Insufficient documentation

## 2018-09-05 DIAGNOSIS — J45909 Unspecified asthma, uncomplicated: Secondary | ICD-10-CM | POA: Diagnosis not present

## 2018-09-05 DIAGNOSIS — Z87891 Personal history of nicotine dependence: Secondary | ICD-10-CM | POA: Diagnosis not present

## 2018-09-05 DIAGNOSIS — R42 Dizziness and giddiness: Secondary | ICD-10-CM | POA: Insufficient documentation

## 2018-09-05 DIAGNOSIS — F10120 Alcohol abuse with intoxication, uncomplicated: Secondary | ICD-10-CM | POA: Insufficient documentation

## 2018-09-05 DIAGNOSIS — Z86711 Personal history of pulmonary embolism: Secondary | ICD-10-CM | POA: Diagnosis not present

## 2018-09-05 DIAGNOSIS — F10920 Alcohol use, unspecified with intoxication, uncomplicated: Secondary | ICD-10-CM

## 2018-09-05 DIAGNOSIS — Z7901 Long term (current) use of anticoagulants: Secondary | ICD-10-CM | POA: Diagnosis not present

## 2018-09-05 LAB — URINALYSIS, ROUTINE W REFLEX MICROSCOPIC
Bacteria, UA: NONE SEEN
Bilirubin Urine: NEGATIVE
Glucose, UA: NEGATIVE mg/dL
Ketones, ur: 5 mg/dL — AB
Nitrite: NEGATIVE
Protein, ur: 30 mg/dL — AB
Specific Gravity, Urine: 1.025 (ref 1.005–1.030)
pH: 5 (ref 5.0–8.0)

## 2018-09-05 LAB — RAPID URINE DRUG SCREEN, HOSP PERFORMED
Amphetamines: NOT DETECTED
Barbiturates: NOT DETECTED
Benzodiazepines: NOT DETECTED
Cocaine: NOT DETECTED
Opiates: NOT DETECTED
Tetrahydrocannabinol: NOT DETECTED

## 2018-09-05 LAB — CBC WITH DIFFERENTIAL/PLATELET
Abs Immature Granulocytes: 0.03 10*3/uL (ref 0.00–0.07)
Basophils Absolute: 0.1 10*3/uL (ref 0.0–0.1)
Basophils Relative: 1 %
Eosinophils Absolute: 0.1 10*3/uL (ref 0.0–0.5)
Eosinophils Relative: 1 %
HCT: 45 % (ref 36.0–46.0)
Hemoglobin: 15 g/dL (ref 12.0–15.0)
Immature Granulocytes: 0 %
Lymphocytes Relative: 24 %
Lymphs Abs: 2 10*3/uL (ref 0.7–4.0)
MCH: 29.9 pg (ref 26.0–34.0)
MCHC: 33.3 g/dL (ref 30.0–36.0)
MCV: 89.6 fL (ref 80.0–100.0)
Monocytes Absolute: 0.6 10*3/uL (ref 0.1–1.0)
Monocytes Relative: 7 %
Neutro Abs: 5.6 10*3/uL (ref 1.7–7.7)
Neutrophils Relative %: 67 %
Platelets: 252 10*3/uL (ref 150–400)
RBC: 5.02 MIL/uL (ref 3.87–5.11)
RDW: 15.2 % (ref 11.5–15.5)
WBC: 8.4 10*3/uL (ref 4.0–10.5)
nRBC: 0 % (ref 0.0–0.2)

## 2018-09-05 LAB — COMPREHENSIVE METABOLIC PANEL
ALT: 67 U/L — ABNORMAL HIGH (ref 0–44)
AST: 63 U/L — ABNORMAL HIGH (ref 15–41)
Albumin: 3.8 g/dL (ref 3.5–5.0)
Alkaline Phosphatase: 104 U/L (ref 38–126)
Anion gap: 18 — ABNORMAL HIGH (ref 5–15)
BUN: 15 mg/dL (ref 6–20)
CO2: 22 mmol/L (ref 22–32)
Calcium: 9 mg/dL (ref 8.9–10.3)
Chloride: 98 mmol/L (ref 98–111)
Creatinine, Ser: 0.85 mg/dL (ref 0.44–1.00)
GFR calc Af Amer: >60 mL/min (ref >60)
GFR calc non Af Amer: >60 mL/min (ref >60)
Glucose, Bld: 85 mg/dL (ref 70–99)
Potassium: 3.8 mmol/L (ref 3.5–5.1)
Sodium: 138 mmol/L (ref 135–145)
Total Bilirubin: 0.8 mg/dL (ref 0.3–1.2)
Total Protein: 7.5 g/dL (ref 6.5–8.1)

## 2018-09-05 LAB — MAGNESIUM: Magnesium: 2 mg/dL (ref 1.7–2.4)

## 2018-09-05 LAB — ETHANOL: Alcohol, Ethyl (B): 141 mg/dL — ABNORMAL HIGH (ref <10)

## 2018-09-05 LAB — AMMONIA: Ammonia: 12 umol/L (ref 9–35)

## 2018-09-05 LAB — LIPASE, BLOOD: Lipase: 28 U/L (ref 11–51)

## 2018-09-05 LAB — TROPONIN I (HIGH SENSITIVITY): Troponin I (High Sensitivity): 17 ng/L (ref <18)

## 2018-09-05 MED ORDER — SODIUM CHLORIDE 0.9 % IV BOLUS
1000.0000 mL | Freq: Once | INTRAVENOUS | Status: AC
  Administered 2018-09-05: 1000 mL via INTRAVENOUS

## 2018-09-05 MED ORDER — CHLORDIAZEPOXIDE HCL 25 MG PO CAPS: ORAL_CAPSULE | ORAL | 0 refills | Status: DC

## 2018-09-05 MED ORDER — LORAZEPAM 2 MG/ML IJ SOLN
1.0000 mg | Freq: Once | INTRAMUSCULAR | Status: AC
  Administered 2018-09-05: 1 mg via INTRAVENOUS
  Filled 2018-09-05: qty 1

## 2018-09-05 MED ORDER — ONDANSETRON HCL 4 MG/2ML IJ SOLN
4.0000 mg | Freq: Once | INTRAMUSCULAR | Status: AC
  Administered 2018-09-05: 4 mg via INTRAVENOUS
  Filled 2018-09-05: qty 2

## 2018-09-05 NOTE — Discharge Instructions (Signed)
Stop drinking alcohol   Take librium as needed for prevent withdrawal   Stay hydrated   See resources for alcohol   See your doctor  Return to ER if you have worse tremors, seizures, vomiting,

## 2018-09-05 NOTE — ED Notes (Signed)
Pt ambulatory to bathroom without any problems 

## 2018-09-05 NOTE — ED Triage Notes (Signed)
Pt st's she has been drinking Vodka all week and not eating or drinking any water.  Pt c/o nausea

## 2018-09-05 NOTE — ED Notes (Signed)
Pt to CT at this time.

## 2018-09-05 NOTE — ED Provider Notes (Signed)
[facility] EMERGENCY DEPARTMENT Provider Note   CSN: [id number] Arrival date & time: 09/05/18  1635     History   Chief Complaint Chief Complaint  Patient presents with   Nausea    HPI [name] is a 58 y.o. female history of alcohol abuse, hypertension here presenting with nausea and dehydration.  Patient states that she has been home all the time and has been drinking a lot of alcohol. She has been drinking a lot of vodka at home. She has been nauseated but had no vomiting. She felt weak and tremulous as well.   The history is provided by the patient.    Past Medical History:  Diagnosis Date   Alcohol abuse    abuse- moderate years ago - states only drinks now on weekends -2 to 8 driinks    Asthma    COLONIC POLYPS, HX OF 04/05/2010   DIVERTICULITIS, HX OF 04/05/2010   DJD (degenerative joint disease)    right knee, mot to severe   GERD (gastroesophageal reflux disease)    no meds   Heart murmur    hx of    Hyperlipidemia    Hypertension    Impaired glucose tolerance 12/06/2013   Obesity (BMI 30-39.9)    PALPITATIONS, HX OF 09/14/2007    Patient Active Problem List   Diagnosis Date Noted   Pulmonary embolism ([place]) 07/06/2018   Elevated troponin 07/06/2018   Leukocytosis 09/22/2017   Abnormal urine color 09/22/2017   Rash 09/22/2017   Sepsis ([place]) 09/12/2016   Colovesical fistula 09/12/2016   Hypertension 07/28/2016   Alcohol abuse 07/28/2016   Obesity (BMI 30-39.9) 07/28/2016   Abscess of bladder 07/28/2016   Colonic diverticulum 07/28/2016   Acute hypokalemia 07/28/2016   Hyperlipidemia 07/10/2016   Mass of left side of neck 07/10/2016   Head and neck lymphadenopathy 07/10/2016   Acute sinus infection 01/25/2016   Eustachian tube disorder 01/25/2016   Asthma exacerbation 05/18/2015   Peripheral edema 03/16/2015   Asthma 03/16/2015   Right shoulder pain 03/16/2015   Hypokalemia 10/04/2014    Upper airway cough syndrome 10/03/2014   Severe obesity (BMI >= 40) ([facility]) 10/03/2014   Lower back pain 05/25/2014   Bilateral shoulder pain 05/25/2014   Chest pain 04/14/2014   Allergic rhinitis 12/06/2013   Impaired glucose tolerance 12/06/2013   Expected blood loss anemia 12/16/2012   Morbid obesity ([place]) 12/15/2012   S/P left TKA 12/14/2012   Goiter 11/26/2012   Preop exam for internal medicine 11/26/2012   Diarrhea 02/25/2012   Vaginal bleeding 04/30/2011   Colon polyps 02/10/2011   Eczema 02/10/2011   Depression 02/10/2011   Preventative health care 09/27/2010   Anxiety state 04/05/2010   DIVERTICULITIS, HX OF 04/05/2010   PALPITATIONS, HX OF 09/14/2007   Obesity 04/24/2007   VOCAL CORD DISORDER 04/24/2007   Extrinsic asthma 04/24/2007   GERD 04/24/2007    Past Surgical History:  Procedure Laterality Date   ABDOMINAL HYSTERECTOMY  age 57   fibroids   COLONOSCOPY WITH PROPOFOL N/A 07/25/2016   Procedure: COLONOSCOPY WITH PROPOFOL;  Surgeon: Carol Ada, MD;  Location: [facility];  Service: Endoscopy;  Laterality: N/A;   colonscopy     x 2   IR RADIOLOGIST EVAL & MGMT  08/12/2016   IR RADIOLOGIST EVAL & MGMT  08/21/2016   KNEE ARTHROSCOPY     left    TOTAL KNEE ARTHROPLASTY  07/29/2011   Procedure: TOTAL KNEE ARTHROPLASTY;  Surgeon: Mauri Pole,  MD;  Location: [facility];  Service: Orthopedics;  Laterality: Right;   TOTAL KNEE ARTHROPLASTY Left 12/14/2012   Procedure: LEFT TOTAL KNEE ARTHROPLASTY;  Surgeon: Mauri Pole, MD;  Location: [facility];  Service: Orthopedics;  Laterality: Left;     OB History    Gravida  1   Para  1   Term      Preterm      AB      Living  1     SAB      TAB      Ectopic      Multiple      Live Births               Home Medications    Prior to Admission medications   Medication Sig Start Date End Date Taking? Authorizing Provider  apixaban (ELIQUIS) 5 MG TABS tablet Take 1 tablet  (5 mg total) by mouth 2 (two) times daily. 08/20/18   Tanda Rockers, MD  Eliquis DVT/PE Starter Pack (ELIQUIS STARTER PACK) 5 MG TABS Take as directed on package: start with two-5mg  tablets twice daily for 7 days. On day 8, switch to one-5mg  tablet twice daily. 07/07/18   Marcell Anger, MD  Fluticasone-Salmeterol (ADVAIR DISKUS) 250-50 MCG/DOSE AEPB Inhale 1 puff into the lungs 2 (two) times daily. 09/22/17   Biagio Borg, MD  LIPITOR 10 MG tablet TAKE 1 TABLET BY MOUTH EVERY DAY 02/15/18   Biagio Borg, MD  losartan-hydrochlorothiazide [facility]) 100-25 MG tablet TAKE 1 TABLET BY MOUTH EVERY DAY 04/14/18   Biagio Borg, MD  potassium chloride SA (K-DUR,KLOR-CON) 20 MEQ tablet Take 1 tablet (20 mEq total) by mouth daily. 09/22/17   Biagio Borg, MD  PROAIR HFA 108 [phone] Base) MCG/ACT inhaler INHALE 1 TO 2 PUFFS BY MOUTH EVERY 6 HOURS AS NEEDED FOR WHEEZE 02/19/18   Biagio Borg, MD  triamcinolone cream (KENALOG) 0.1 % APPLY TO AFFECTED AREA TWICE A DAY 06/11/18   Biagio Borg, MD    Family History Family History  Problem Relation Age of Onset   Stroke Mother    COPD Father    Lymphoma Sister     Social History Social History   Tobacco Use   Smoking status: Former Smoker    Packs/day: 0.50    Years: 7.00    Pack years: 3.50    Types: Cigarettes    Quit date: 02/11/1988    Years since quitting: 30.5   Smokeless tobacco: Never Used  Substance Use Topics   Alcohol use: Yes    Comment: Occas   Drug use: No    Comment: smoked marajuania x 10 years.  Quit in 1985.     Allergies   Morphine, Shrimp [shellfish allergy], Tramadol, and Latex   Review of Systems Review of Systems  Gastrointestinal: Positive for nausea.  All other systems reviewed and are negative.    Physical Exam Updated Vital Signs BP 140/84 (BP Location: Right Arm)    Pulse (!) 103    Temp 98.5 F (36.9 C) (Oral)    Resp (!) 23    Ht 5\' 2"  (1.575 m)    Wt 130.6 kg    SpO2 97%    BMI 52.68 kg/m    Physical Exam Vitals signs and nursing note reviewed.  Constitutional:      Comments: Some resting tremors, slightly anxious and dehydrated   HENT:     Head: Normocephalic and atraumatic.  Nose: Nose normal.     Mouth/Throat:     Mouth: Mucous membranes are dry.     Comments: MM slightly dry  Eyes:     Extraocular Movements: Extraocular movements intact.     Pupils: Pupils are equal, round, and reactive to light.  Neck:     Musculoskeletal: Normal range of motion and neck supple.  Cardiovascular:     Rate and Rhythm: Normal rate and regular rhythm.     Pulses: Normal pulses.     Comments: Borderline tachycardic  Pulmonary:     Effort: Pulmonary effort is normal.     Breath sounds: Normal breath sounds.  Abdominal:     General: Abdomen is flat.     Palpations: Abdomen is soft.  Musculoskeletal: Normal range of motion.  Skin:    General: Skin is warm.     Capillary Refill: Capillary refill takes less than 2 seconds.  Neurological:     General: No focal deficit present.     Mental Status: She is alert and oriented to person, place, and time.     Comments: Slightly intoxicated, CN 2- 12 intact. Some resting tremors, improved with movement. Nl strength and sensation bilaterally. No obvious asterixis   Psychiatric:        Mood and Affect: Mood normal.      ED Treatments / Results  Labs (all labs ordered are listed, but only abnormal results are displayed) Labs Reviewed  COMPREHENSIVE METABOLIC PANEL - Abnormal; Notable for the following components:      Result Value   AST 63 (*)    ALT 67 (*)    Anion gap 18 (*)    All other components within normal limits  URINALYSIS, ROUTINE W REFLEX MICROSCOPIC - Abnormal; Notable for the following components:   Hgb urine dipstick SMALL (*)    Ketones, ur 5 (*)    Protein, ur 30 (*)    Leukocytes,Ua TRACE (*)    All other components within normal limits  ETHANOL - Abnormal; Notable for the following components:   Alcohol,  Ethyl (B) 141 (*)    All other components within normal limits  CBC WITH DIFFERENTIAL/PLATELET  MAGNESIUM  LIPASE, BLOOD  RAPID URINE DRUG SCREEN, HOSP PERFORMED  AMMONIA  TROPONIN I (HIGH SENSITIVITY)    EKG EKG Interpretation  Date/Time:  Sunday September 05 2018 16:58:32 EDT Ventricular Rate:  106 PR Interval:    QRS Duration: 94 QT Interval:  360 QTC Calculation: 478 R Axis:   13 Text Interpretation:  Sinus tachycardia Ventricular premature complex Nonspecific repol abnormality, lateral leads No significant change since last tracing Confirmed by Wandra Arthurs [phone]) on 09/05/2018 5:18:17 PM   Radiology Ct Head Wo Contrast  Result Date: 09/05/2018 CLINICAL DATA:  Initial evaluation for acute weakness, dizziness. EXAM: CT HEAD WITHOUT CONTRAST TECHNIQUE: Contiguous axial images were obtained from the base of the skull through the vertex without intravenous contrast. COMPARISON:  None available. FINDINGS: Brain: Cerebral volume within normal limits for patient age. Mild patchy hypodensity within the periventricular and deep white matter both cerebral hemispheres most likely related chronic microvascular ischemic disease. No evidence for acute intracranial hemorrhage. No findings to suggest acute large vessel territory infarct. No mass lesion, midline shift, or mass effect. Ventricles are normal in size without evidence for hydrocephalus. No extra-axial fluid collection identified. Vascular: No hyperdense vessel identified. Skull: Scalp soft tissues demonstrate no acute abnormality. Calvarium intact. Sinuses/Orbits: Globes and orbital soft tissues within normal limits. Visualized paranasal sinuses  are clear. 15 mm polypoid opacity at the left nasal cavity suspected to reflect a nasal polyp (series 4, image 5). IMPRESSION: 1. No acute intracranial abnormality. 2. Mild chronic microvascular ischemic disease. 3. 15 mm polypoid opacity within the left nasal cavity, suspected to reflect a nasal polyp.  Correlation with physical exam suggested. Electronically Signed   By: Jeannine Boga M.D.   On: 09/05/2018 19:04    Procedures Procedures (including critical care time)  Medications Ordered in ED Medications  LORazepam (ATIVAN) injection 1 mg (has no administration in time range)  sodium chloride 0.9 % bolus 1,000 mL (1,000 mLs Intravenous New Bag/Given 09/05/18 1815)  ondansetron (ZOFRAN) injection 4 mg (4 mg Intravenous Given 09/05/18 1842)     Initial Impression / Assessment and Plan / ED Course  I have reviewed the triage vital signs and the nursing notes.  Pertinent labs & imaging results that were available during my care of the patient were reviewed by me and considered in my medical decision making (see chart for details).       [name] is a 58 y.o. female here with nausea, alcohol intoxication.  Consider electrolyte abnormality, alcohol withdrawal. Will get labs, CT head, ammonia level.   9:38 PM Labs unremarkable. AG is 18 likely from dehydration. Her alcohol level is 140. Given IVF in the ED and ativan and felt better. No seizures at home. Will dc home with librium taper. Told her to stop drinking alcohol.    Final Clinical Impressions(s) / ED Diagnoses   Final diagnoses:  None    ED Discharge Orders    None       Drenda Freeze, MD 09/05/18 2139

## 2018-09-05 NOTE — ED Notes (Signed)
Pt is sitting in chair beside stretcher

## 2018-09-05 NOTE — ED Notes (Signed)
Pt transported to CT ?

## 2018-09-06 ENCOUNTER — Telehealth: Payer: Self-pay | Admitting: Internal Medicine

## 2018-09-06 NOTE — Telephone Encounter (Signed)
If still taking eliquis consistently it would be very unlikely due to a  Blood clot so will need to consult her PCP or ortho doctor if she has one

## 2018-09-06 NOTE — Telephone Encounter (Signed)
Called and spoke with pt. Pt stated she began having pain in her legs 2 days ago and due to her previously having a blood clot, it began to worry her. Pt also stated that she has been having muscle spasms but states the spasms is in her back.  Pt wanted to know what we recommend due to the pain in her legs. Dr. Melvyn Novas, please advise on this for pt. Thanks!  Instructions from [place] with MW 07/28/2018  Call me if trouble getting the eliquis - you need to be on it for 6 full months   Weight control is simply a matter of calorie balance which needs to be tilted in your favor by eating less and exercising more.  To get the most out of exercise, you need to be continuously aware that you are short of breath, but never out of breath, for 30 minutes daily. As you improve, it will actually be easier for you to do the same amount of exercise  in  30 minutes so always push to the level where you are short of breath.  If this does not result in gradual weight reduction then I strongly recommend you see a nutritionist with a food diary x 2 weeks so that we can work out a negative calorie balance which is universally effective in steady weight loss programs.  Think of your calorie balance like you do your bank account where in this case you want the balance to go down so you must take in less calories than you burn up.  It's just that simple:  Hard to do, but easy to understand.  Good luck!    Please schedule a follow up visit in 5  months but call sooner if needed

## 2018-09-06 NOTE — Telephone Encounter (Signed)
Attempted to call pt but unable to reach. Left message for pt to return call. 

## 2018-09-07 NOTE — Telephone Encounter (Signed)
Attempted to call pt but unable to reach. Left message for pt to return call. 

## 2018-09-08 NOTE — Telephone Encounter (Signed)
Attempted to call pt but unable to reach. Per pt's DPR, it is okay to leave a detailed message on pt's mobile. I left pt a detailed message with the info stated by MW.nothing further needed.

## 2018-09-10 ENCOUNTER — Ambulatory Visit: Payer: BC Managed Care – PPO

## 2018-09-14 ENCOUNTER — Other Ambulatory Visit: Payer: Self-pay | Admitting: Internal Medicine

## 2018-09-27 ENCOUNTER — Other Ambulatory Visit: Payer: Self-pay

## 2018-09-27 ENCOUNTER — Ambulatory Visit (INDEPENDENT_AMBULATORY_CARE_PROVIDER_SITE_OTHER): Payer: BC Managed Care – PPO | Admitting: Podiatry

## 2018-09-27 VITALS — Temp 98.5°F

## 2018-09-27 DIAGNOSIS — M76821 Posterior tibial tendinitis, right leg: Secondary | ICD-10-CM

## 2018-09-27 DIAGNOSIS — L600 Ingrowing nail: Secondary | ICD-10-CM

## 2018-09-27 MED ORDER — HYDROCODONE-ACETAMINOPHEN 5-325 MG PO TABS: 1.0000 | ORAL_TABLET | Freq: Four times a day (QID) | ORAL | 0 refills | Status: DC | PRN

## 2018-10-01 NOTE — Progress Notes (Signed)
Subjective:   Patient ID: [name], female   DOB: 58 y.o.   MRN: [id number]   HPI Patient states she is seems to be doing pretty good but she still getting some pain on the inside of her heel and also is concerned about nails that are thickened and could be giving her problems   ROS      Objective:  Physical Exam  Neurovascular status intact with patient's right fascia improving but still mildly tender with obesity is complicating factor and nailbeds that are thickened but localized in nature     Assessment:  Fasciitis still present with low-grade nails that are thickened but are healing well from surgery     Plan:  H&P reviewed conditions and debrided some nailbeds and advised on both conditions recommended supportive shoes hopeful weight loss and that the nail should heal uneventfully.  Reappoint as needed

## 2018-10-12 ENCOUNTER — Other Ambulatory Visit: Payer: Self-pay | Admitting: Internal Medicine

## 2018-10-17 ENCOUNTER — Other Ambulatory Visit: Payer: Self-pay | Admitting: Internal Medicine

## 2018-10-19 ENCOUNTER — Other Ambulatory Visit: Payer: Self-pay | Admitting: Internal Medicine

## 2018-11-09 ENCOUNTER — Encounter: Payer: Self-pay | Admitting: Gynecology

## 2018-12-02 ENCOUNTER — Other Ambulatory Visit: Payer: Self-pay

## 2018-12-02 DIAGNOSIS — Z20822 Contact with and (suspected) exposure to covid-19: Secondary | ICD-10-CM

## 2018-12-02 DIAGNOSIS — Z20828 Contact with and (suspected) exposure to other viral communicable diseases: Secondary | ICD-10-CM | POA: Diagnosis not present

## 2018-12-04 LAB — NOVEL CORONAVIRUS, NAA: SARS-CoV-2, NAA: NOT DETECTED

## 2018-12-06 ENCOUNTER — Other Ambulatory Visit: Payer: Self-pay

## 2018-12-06 ENCOUNTER — Ambulatory Visit (HOSPITAL_COMMUNITY)
Admission: EM | Admit: 2018-12-06 | Discharge: 2018-12-06 | Disposition: A | Discharge status: Home / Self Care | Payer: BC Managed Care – PPO | Attending: Family Medicine | Admitting: Family Medicine

## 2018-12-06 ENCOUNTER — Encounter (HOSPITAL_COMMUNITY): Payer: Self-pay

## 2018-12-06 DIAGNOSIS — R000 Tachycardia, unspecified: Secondary | ICD-10-CM

## 2018-12-06 DIAGNOSIS — Z789 Other specified health status: Secondary | ICD-10-CM

## 2018-12-06 DIAGNOSIS — K219 Gastro-esophageal reflux disease without esophagitis: Secondary | ICD-10-CM

## 2018-12-06 DIAGNOSIS — F10939 Alcohol use, unspecified with withdrawal, unspecified: Secondary | ICD-10-CM

## 2018-12-06 DIAGNOSIS — F1090 Alcohol use, unspecified, uncomplicated: Secondary | ICD-10-CM

## 2018-12-06 MED ORDER — ONDANSETRON 4 MG PO TBDP
ORAL_TABLET | ORAL | Status: AC
  Filled 2018-12-06: qty 1

## 2018-12-06 MED ORDER — LIDOCAINE VISCOUS HCL 2 % MT SOLN
OROMUCOSAL | Status: AC
  Filled 2018-12-06: qty 15

## 2018-12-06 MED ORDER — ALUM & MAG HYDROXIDE-SIMETH 200-200-20 MG/5ML PO SUSP
ORAL | Status: AC
  Filled 2018-12-06: qty 30

## 2018-12-06 MED ORDER — ALUM & MAG HYDROXIDE-SIMETH 200-200-20 MG/5ML PO SUSP
30.0000 mL | Freq: Once | ORAL | Status: AC
  Administered 2018-12-06: 30 mL via ORAL

## 2018-12-06 MED ORDER — LIDOCAINE VISCOUS HCL 2 % MT SOLN
15.0000 mL | Freq: Once | OROMUCOSAL | Status: AC
  Administered 2018-12-06: 15 mL via ORAL

## 2018-12-06 MED ORDER — ONDANSETRON 4 MG PO TBDP: 4.0000 mg | ORAL_TABLET | Freq: Three times a day (TID) | ORAL | 0 refills | Status: DC | PRN

## 2018-12-06 MED ORDER — ONDANSETRON 4 MG PO TBDP
4.0000 mg | ORAL_TABLET | Freq: Once | ORAL | Status: AC
  Administered 2018-12-06: 4 mg via ORAL

## 2018-12-06 MED ORDER — PANTOPRAZOLE SODIUM 20 MG PO TBEC: 20.0000 mg | DELAYED_RELEASE_TABLET | Freq: Every day | ORAL | 0 refills | Status: DC

## 2018-12-06 NOTE — ED Provider Notes (Signed)
[place]    CSN: [id number] Arrival date & time: 12/06/18  1633      History   Chief Complaint Chief Complaint  Patient presents with  . Gastroesophageal Reflux    HPI ELISABETH BEN is a 58 y.o. female.   [name] presents with complaints of heart burn / reflux as well as episode of vomiting and diarrhea this morning. Yellow emesis and stool. Has had reflux for some time now, has been drinking pepto bismol and mylanta frequently, which haven't been helping. No further emesis or diarrhea this evening. No abdominal pain. burning to throat. No arm or jaw pain. No dizziness. No urinary symptoms. No headache. States she hasn't eaten in three days due to reflux. She has been drinking increased alcohol over the past few months, she splits a pint of alcohol with her husband every day. States drank more than a pint of alcohol yesterday, hasn't drank any today. Mild headache. History  Of abuse, asthma, diverticulitis, gerd, htn, obesity, palpitations.     ROS per HPI, negative if not otherwise mentioned.      Past Medical History:  Diagnosis Date  . Alcohol abuse    abuse- moderate years ago - states only drinks now on weekends -2 to 8 driinks   . Asthma   . COLONIC POLYPS, HX OF 04/05/2010  . DIVERTICULITIS, HX OF 04/05/2010  . DJD (degenerative joint disease)    right knee, mot to severe  . GERD (gastroesophageal reflux disease)    no meds  . Heart murmur    hx of   . Hyperlipidemia   . Hypertension   . Impaired glucose tolerance 12/06/2013  . Obesity (BMI 30-39.9)   . PALPITATIONS, HX OF 09/14/2007    Patient Active Problem List   Diagnosis Date Noted  . Pulmonary embolism ([place]) 07/06/2018  . Elevated troponin 07/06/2018  . Leukocytosis 09/22/2017  . Abnormal urine color 09/22/2017  . Rash 09/22/2017  . Sepsis ([place]) 09/12/2016  . Colovesical fistula 09/12/2016  . Hypertension 07/28/2016  . Alcohol abuse 07/28/2016  . Obesity (BMI 30-39.9)  07/28/2016  . Abscess of bladder 07/28/2016  . Colonic diverticulum 07/28/2016  . Acute hypokalemia 07/28/2016  . Hyperlipidemia 07/10/2016  . Mass of left side of neck 07/10/2016  . Head and neck lymphadenopathy 07/10/2016  . Acute sinus infection 01/25/2016  . Eustachian tube disorder 01/25/2016  . Asthma exacerbation 05/18/2015  . Peripheral edema 03/16/2015  . Asthma 03/16/2015  . Right shoulder pain 03/16/2015  . Hypokalemia 10/04/2014  . Upper airway cough syndrome 10/03/2014  . Severe obesity (BMI >= 40) ([place]) 10/03/2014  . Lower back pain 05/25/2014  . Bilateral shoulder pain 05/25/2014  . Chest pain 04/14/2014  . Allergic rhinitis 12/06/2013  . Impaired glucose tolerance 12/06/2013  . Expected blood loss anemia 12/16/2012  . Morbid obesity ([place]) 12/15/2012  . S/P left TKA 12/14/2012  . Goiter 11/26/2012  . Preop exam for internal medicine 11/26/2012  . Diarrhea 02/25/2012  . Vaginal bleeding 04/30/2011  . Colon polyps 02/10/2011  . Eczema 02/10/2011  . Depression 02/10/2011  . Preventative health care 09/27/2010  . Anxiety state 04/05/2010  . DIVERTICULITIS, HX OF 04/05/2010  . PALPITATIONS, HX OF 09/14/2007  . Obesity 04/24/2007  . VOCAL CORD DISORDER 04/24/2007  . Extrinsic asthma 04/24/2007  . GERD 04/24/2007    Past Surgical History:  Procedure Laterality Date  . ABDOMINAL HYSTERECTOMY  age 52   fibroids  . COLONOSCOPY WITH PROPOFOL N/A 07/25/2016  Procedure: COLONOSCOPY WITH PROPOFOL;  Surgeon: Carol Ada, MD;  Location: [facility];  Service: Endoscopy;  Laterality: N/A;  . colonscopy     x 2  . IR RADIOLOGIST EVAL & MGMT  08/12/2016  . IR RADIOLOGIST EVAL & MGMT  08/21/2016  . KNEE ARTHROSCOPY     left   . TOTAL KNEE ARTHROPLASTY  07/29/2011   Procedure: TOTAL KNEE ARTHROPLASTY;  Surgeon: Mauri Pole, MD;  Location: [facility];  Service: Orthopedics;  Laterality: Right;  . TOTAL KNEE ARTHROPLASTY Left 12/14/2012   Procedure: LEFT TOTAL KNEE  ARTHROPLASTY;  Surgeon: Mauri Pole, MD;  Location: [facility];  Service: Orthopedics;  Laterality: Left;    OB History    Gravida  1   Para  1   Term      Preterm      AB      Living  1     SAB      TAB      Ectopic      Multiple      Live Births               Home Medications    Prior to Admission medications   Medication Sig Start Date End Date Taking? Authorizing Provider  apixaban (ELIQUIS) 5 MG TABS tablet Take 1 tablet (5 mg total) by mouth 2 (two) times daily. 08/20/18   Tanda Rockers, MD  chlordiazePOXIDE (LIBRIUM) 25 MG capsule 50mg  PO TID x 1D, then 25-50mg  PO BID X 1D, then 25-50mg  PO QD X 1D 09/05/18   Drenda Freeze, MD  Eliquis DVT/PE Starter Pack [facility] STARTER PACK) 5 MG TABS Take as directed on package: start with two-5mg  tablets twice daily for 7 days. On day 8, switch to one-5mg  tablet twice daily. 07/07/18   Marcell Anger, MD  Fluticasone-Salmeterol (ADVAIR DISKUS) 250-50 MCG/DOSE AEPB Inhale 1 puff into the lungs 2 (two) times daily. 09/22/17   Biagio Borg, MD  HYDROcodone-acetaminophen (NORCO) 10-325 MG tablet TAKE 1 TABLET BY MOUTH EVERY 8 (EIGHT) HOURS AS NEEDED FOR UP TO 5 DAYS. 07/28/18   [provider]  HYDROcodone-acetaminophen (NORCO/VICODIN) 5-325 MG tablet Take 1 tablet by mouth every 6 (six) hours as needed for moderate pain. 09/27/18   Wallene Huh, DPM  KLOR-CON M20 20 MEQ tablet TAKE 1 TABLET BY MOUTH EVERY DAY 10/20/18   Biagio Borg, MD  LIPITOR 10 MG tablet TAKE 1 TABLET BY MOUTH EVERY DAY 02/15/18   Biagio Borg, MD  losartan-hydrochlorothiazide [facility]) 100-25 MG tablet TAKE 1 TABLET BY MOUTH EVERY DAY 10/19/18   Biagio Borg, MD  ondansetron (ZOFRAN-ODT) 4 MG disintegrating tablet Take 1 tablet (4 mg total) by mouth every 8 (eight) hours as needed for nausea or vomiting. 12/06/18   Zigmund Gottron, NP  pantoprazole (PROTONIX) 20 MG tablet Take 1 tablet (20 mg total) by mouth daily. 12/06/18   Zigmund Gottron, NP  PROAIR HFA [phone] Base) MCG/ACT inhaler INHALE 1 TO 2 PUFFS BY MOUTH EVERY 6 HOURS AS NEEDED FOR WHEEZE 10/12/18   Biagio Borg, MD  triamcinolone cream (KENALOG) 0.1 % APPLY TO AFFECTED AREA TWICE A DAY 09/15/18   Biagio Borg, MD    Family History Family History  Problem Relation Age of Onset  . Stroke Mother   . COPD Father   . Lymphoma Sister     Social History Social History   Tobacco Use  . Smoking  status: Former Smoker    Packs/day: 0.50    Years: 7.00    Pack years: 3.50    Types: Cigarettes    Quit date: 02/11/1988    Years since quitting: 30.8  . Smokeless tobacco: Never Used  Substance Use Topics  . Alcohol use: Yes    Comment: Occas  . Drug use: No    Comment: smoked marajuania x 10 years.  Quit in 1985.     Allergies   Morphine, Shrimp [shellfish allergy], Tramadol, and Latex   Review of Systems Review of Systems   Physical Exam Triage Vital Signs ED Triage Vitals  Enc Vitals Group     BP 12/06/18 1730 (!) 153/90     Pulse Rate 12/06/18 1730 (!) 104     Resp 12/06/18 1730 18     Temp 12/06/18 1730 97.7 F (36.5 C)     Temp Source 12/06/18 1730 Oral     SpO2 12/06/18 1730 100 %     Weight 12/06/18 1732 281 lb 9.6 oz (127.7 kg)     Height --      Head Circumference --      Peak Flow --      Pain Score 12/06/18 1732 8     Pain Loc --      Pain Edu? --      Excl. in [place]? --    No data found.  Updated Vital Signs BP (!) 153/90 (BP Location: Right Arm)   Pulse (!) 104   Temp 97.7 F (36.5 C) (Oral)   Resp 18   Wt 281 lb 9.6 oz (127.7 kg)   SpO2 100%   BMI 51.51 kg/m    Physical Exam Constitutional:      General: She is not in acute distress.    Appearance: She is well-developed.  Cardiovascular:     Rate and Rhythm: Tachycardia present.  Pulmonary:     Effort: Pulmonary effort is normal.  Abdominal:     Tenderness: There is no abdominal tenderness.     Comments: Belching during exam   Skin:    General: Skin is  warm and dry.  Neurological:     Mental Status: She is alert and oriented to person, place, and time.    EKG:  Nsr, pvc's noted . Previous EKG was available for review. No stwave changes as interpreted by me.    UC Treatments / Results  Labs (all labs ordered are listed, but only abnormal results are displayed) Labs Reviewed - No data to display  EKG   Radiology No results found.  Procedures Procedures (including critical care time)  Medications Ordered in UC Medications  alum & mag hydroxide-simeth (MAALOX/MYLANTA) 200-200-20 MG/5ML suspension 30 mL (30 mLs Oral Given 12/06/18 1912)    And  lidocaine (XYLOCAINE) 2 % viscous mouth solution 15 mL (15 mLs Oral Given 12/06/18 1913)  ondansetron (ZOFRAN-ODT) disintegrating tablet 4 mg (4 mg Oral Given 12/06/18 1913)  ondansetron (ZOFRAN-ODT) 4 MG disintegrating tablet (has no administration in time range)  alum & mag hydroxide-simeth (MAALOX/MYLANTA) 200-200-20 MG/5ML suspension (has no administration in time range)  lidocaine (XYLOCAINE) 2 % viscous mouth solution (has no administration in time range)    Initial Impression / Assessment and Plan / UC Course  I have reviewed the triage vital signs and the nursing notes.  Pertinent labs & imaging results that were available during my care of the patient were reviewed by me and considered in my medical decision making (see chart  for details).     Patient states she feels significantly improved, tolerating po intake, resolution of symptoms. Suspect dehydration and alcohol intake contributing to gerd and gi symptoms as well as mild tachycardia. Emphasized importance of decrease in alcohol intake. Symptom management discussed. Return precautions provided. Patient verbalized understanding and agreeable to plan. Ambulatory out of clinic without difficulty.    Final Clinical Impressions(s) / UC Diagnoses   Final diagnoses:  Gastroesophageal reflux disease, unspecified whether  esophagitis present  Tachycardia  Heavy alcohol use     Discharge Instructions     Zofran every 8 hours as needed for nausea or vomiting.   Please increase your water intake as you are clearly dehydrated.  Protonix daily to help with reflux prevention.  Please decrease your alcohol intake to help with your reflux.  Please go to the ER for any worsening of symptoms.  Follow up with your primary care provider for management of symptoms long term.     ED Prescriptions    Medication Sig Dispense Auth. Provider   ondansetron (ZOFRAN-ODT) 4 MG disintegrating tablet Take 1 tablet (4 mg total) by mouth every 8 (eight) hours as needed for nausea or vomiting. 12 tablet Augusto Gamble B, NP   pantoprazole (PROTONIX) 20 MG tablet Take 1 tablet (20 mg total) by mouth daily. 30 tablet Zigmund Gottron, NP     PDMP not reviewed this encounter.   Zigmund Gottron, NP 12/06/18 [phone]

## 2018-12-06 NOTE — Discharge Instructions (Signed)
Zofran every 8 hours as needed for nausea or vomiting.   Please increase your water intake as you are clearly dehydrated.  Protonix daily to help with reflux prevention.  Please decrease your alcohol intake to help with your reflux.  Please go to the ER for any worsening of symptoms.  Follow up with your primary care provider for management of symptoms long term.

## 2018-12-06 NOTE — ED Triage Notes (Signed)
Pt states she has acid reflux, voomiting and diarrhea x 2 days.

## 2018-12-13 ENCOUNTER — Telehealth: Payer: Self-pay | Admitting: *Deleted

## 2018-12-13 NOTE — Telephone Encounter (Signed)
Patient called and asked "Dr. Phineas Real is my GYN doctor, but he is retiring. Can I just keep Dr. Fermin Schwab has my GYN doctor." Message sent to [facility] APP

## 2018-12-13 NOTE — Telephone Encounter (Signed)
Told Ms Farell that Susan John, NP said that it is not known if Dr. Aldean Ast will return to [facility] and have clinics. Melissa suggests that she ask Dr. Phineas Real who in his practice he would recommend take over her care. Pt agreeable to this plan.

## 2018-12-23 ENCOUNTER — Other Ambulatory Visit: Payer: Self-pay

## 2018-12-23 ENCOUNTER — Encounter: Payer: Self-pay | Admitting: Podiatry

## 2018-12-23 ENCOUNTER — Ambulatory Visit (INDEPENDENT_AMBULATORY_CARE_PROVIDER_SITE_OTHER): Payer: BC Managed Care – PPO | Admitting: Podiatry

## 2018-12-23 DIAGNOSIS — M76821 Posterior tibial tendinitis, right leg: Secondary | ICD-10-CM | POA: Diagnosis not present

## 2018-12-23 DIAGNOSIS — L600 Ingrowing nail: Secondary | ICD-10-CM | POA: Diagnosis not present

## 2018-12-23 DIAGNOSIS — M722 Plantar fascial fibromatosis: Secondary | ICD-10-CM

## 2018-12-28 NOTE — Progress Notes (Signed)
Subjective:   Patient ID: [name], female   DOB: 58 y.o.   MRN: [id number]   HPI Patient presents concerned about discomfort in her left nail and also that her right heel still hurts but she does not want to have it treated until she is cleared by her hematologist   ROS      Objective:  Physical Exam  H&P with thickened skin around the left big toe nail that is localized with no drainage noted with mild discomfort of the right heel at the insertion     Assessment:  2 separate problems with 1 being dystrophic skin around the nail left but no ingrown along with plantar fasciitis left and right     Plan:  H&P reviewed all conditions and I debrided some tissue applied cushion to the left hallux and discussed possible injection for plantar fasciitis at 1 point in future

## 2018-12-30 ENCOUNTER — Telehealth: Payer: Self-pay | Admitting: Internal Medicine

## 2018-12-30 NOTE — Telephone Encounter (Signed)
Patient called back to schedule for her phone visit for today she can be reached at Ph# [phone]

## 2018-12-30 NOTE — Telephone Encounter (Signed)
Appointment scheduled.

## 2018-12-30 NOTE — Telephone Encounter (Signed)
Patient experiencing cough no other symptoms, patient states its due to the change of weather requesting cough syrup.  As per patient she does not have COVID, patient declined virtual visit stating she does not know how to do that. Please advise patient regarding Rx

## 2018-12-30 NOTE — Telephone Encounter (Signed)
Left message for patient to call back to schedule a phone visit with Dr Jenny Reichmann to discuss.

## 2018-12-31 ENCOUNTER — Ambulatory Visit (INDEPENDENT_AMBULATORY_CARE_PROVIDER_SITE_OTHER): Payer: BC Managed Care – PPO | Admitting: Internal Medicine

## 2018-12-31 ENCOUNTER — Ambulatory Visit: Payer: BC Managed Care – PPO | Admitting: Internal Medicine

## 2018-12-31 ENCOUNTER — Other Ambulatory Visit: Payer: Self-pay

## 2018-12-31 ENCOUNTER — Encounter: Payer: Self-pay | Admitting: Internal Medicine

## 2018-12-31 DIAGNOSIS — J45909 Unspecified asthma, uncomplicated: Secondary | ICD-10-CM | POA: Diagnosis not present

## 2018-12-31 DIAGNOSIS — E611 Iron deficiency: Secondary | ICD-10-CM

## 2018-12-31 DIAGNOSIS — Z0000 Encounter for general adult medical examination without abnormal findings: Secondary | ICD-10-CM

## 2018-12-31 DIAGNOSIS — R7302 Impaired glucose tolerance (oral): Secondary | ICD-10-CM

## 2018-12-31 DIAGNOSIS — E559 Vitamin D deficiency, unspecified: Secondary | ICD-10-CM

## 2018-12-31 DIAGNOSIS — E538 Deficiency of other specified B group vitamins: Secondary | ICD-10-CM

## 2018-12-31 MED ORDER — PREDNISONE 10 MG PO TABS: ORAL_TABLET | ORAL | 0 refills | Status: DC

## 2018-12-31 MED ORDER — HYDROCODONE-HOMATROPINE 5-1.5 MG/5ML PO SYRP: 5.0000 mL | ORAL_SOLUTION | Freq: Four times a day (QID) | ORAL | 0 refills | Status: AC | PRN

## 2018-12-31 NOTE — Assessment & Plan Note (Signed)
With mild exacerbation - see notes

## 2018-12-31 NOTE — Progress Notes (Signed)
Patient ID: [name], female   DOB: 02/15/1960, 58 y.o.   MRN: [id number]  Phone visit  Cumulative time during 7-day interval 12 min, there was not an associated office visit for this concern within a 7 day period.  Verbal consent for services obtained from patient prior to services given.  Names of all persons present for services: Cathlean Cower, MD, patient  Chief complaint: cough  History, background, results pertinent:  Here with c/o 3-4 days onset cough after working in the yard, with allergy upper resp congestion as well, but very small if any worsening sob or wheezing as she has good compliance with advair and albuterol prn.  Pt denies chest pain, orthopnea, PND, increased LE swelling, palpitations, dizziness or syncope.  Pt denies new neurological symptoms such as new headache, or facial or extremity weakness or numbness   Pt denies polydipsia, polyuria.  Pt denies fever, wt loss, night sweats, loss of appetite, or other constitutional symptoms, and does not feel overly ill.  Her coughing gets noticed at work and she is certain they will start sending her home. Past Medical History:  Diagnosis Date  . Alcohol abuse    abuse- moderate years ago - states only drinks now on weekends -2 to 8 driinks   . Asthma   . COLONIC POLYPS, HX OF 04/05/2010  . DIVERTICULITIS, HX OF 04/05/2010  . DJD (degenerative joint disease)    right knee, mot to severe  . GERD (gastroesophageal reflux disease)    no meds  . Heart murmur    hx of   . Hyperlipidemia   . Hypertension   . Impaired glucose tolerance 12/06/2013  . Obesity (BMI 30-39.9)   . PALPITATIONS, HX OF 09/14/2007   No results found for this or any previous visit (from the past 48 hour(s)). Lab Results  Component Value Date   WBC 8.4 09/05/2018   HGB 15.0 09/05/2018   HCT 45.0 09/05/2018   PLT 252 09/05/2018   GLUCOSE 85 09/05/2018   CHOL 173 05/26/2018   TRIG 89.0 05/26/2018   HDL 59.10 05/26/2018   LDLDIRECT 107.0 02/23/2012    LDLCALC 96 05/26/2018   ALT 67 (H) 09/05/2018   AST 63 (H) 09/05/2018   NA 138 09/05/2018   K 3.8 09/05/2018   CL 98 09/05/2018   CREATININE 0.85 09/05/2018   BUN 15 09/05/2018   CO2 22 09/05/2018   TSH 1.93 01/06/2018   INR 1.11 07/28/2016   HGBA1C 5.8 05/26/2018   A/P/next steps:   Asthma Exacerbation - mild, for prednisone asd, cont inhalers, and cough med prn, to f/u any worsening symptoms or concerns  Cathlean Cower MD  This visit occurred during the SARS-CoV-2 public health emergency.  Safety protocols were in place, including screening questions prior to the visit, additional usage of staff PPE, and extensive cleaning of exam room while observing appropriate contact time as indicated for disinfecting solutions.

## 2018-12-31 NOTE — Patient Instructions (Signed)
Please take all new medication as prescribed  Please continue all other medications as before, and refills have been done if requested.  Please have the pharmacy call with any other refills you may need.  Please keep your appointments with your specialists as you may have planned     

## 2018-12-31 NOTE — Assessment & Plan Note (Signed)
stable overall by history and exam, recent data reviewed with pt, and pt to continue medical treatment as before,  to f/u any worsening symptoms or concerns  

## 2019-01-14 ENCOUNTER — Other Ambulatory Visit: Payer: Self-pay | Admitting: Internal Medicine

## 2019-01-14 ENCOUNTER — Other Ambulatory Visit: Payer: Self-pay

## 2019-01-14 ENCOUNTER — Ambulatory Visit: Payer: BC Managed Care – PPO | Admitting: Internal Medicine

## 2019-01-14 ENCOUNTER — Encounter: Payer: Self-pay | Admitting: Internal Medicine

## 2019-01-14 ENCOUNTER — Ambulatory Visit (INDEPENDENT_AMBULATORY_CARE_PROVIDER_SITE_OTHER): Payer: BC Managed Care – PPO | Admitting: Internal Medicine

## 2019-01-14 ENCOUNTER — Other Ambulatory Visit (INDEPENDENT_AMBULATORY_CARE_PROVIDER_SITE_OTHER): Payer: BC Managed Care – PPO

## 2019-01-14 VITALS — BP 124/82 | HR 122 | Temp 98.0°F | Ht 62.0 in | Wt 290.0 lb

## 2019-01-14 DIAGNOSIS — E611 Iron deficiency: Secondary | ICD-10-CM

## 2019-01-14 DIAGNOSIS — E559 Vitamin D deficiency, unspecified: Secondary | ICD-10-CM

## 2019-01-14 DIAGNOSIS — R7302 Impaired glucose tolerance (oral): Secondary | ICD-10-CM

## 2019-01-14 DIAGNOSIS — J4530 Mild persistent asthma, uncomplicated: Secondary | ICD-10-CM | POA: Diagnosis not present

## 2019-01-14 DIAGNOSIS — E538 Deficiency of other specified B group vitamins: Secondary | ICD-10-CM | POA: Diagnosis not present

## 2019-01-14 DIAGNOSIS — Z0000 Encounter for general adult medical examination without abnormal findings: Secondary | ICD-10-CM

## 2019-01-14 DIAGNOSIS — E785 Hyperlipidemia, unspecified: Secondary | ICD-10-CM | POA: Diagnosis not present

## 2019-01-14 DIAGNOSIS — I2782 Chronic pulmonary embolism: Secondary | ICD-10-CM | POA: Diagnosis not present

## 2019-01-14 LAB — LIPID PANEL
Cholesterol: 182 mg/dL (ref 0–200)
HDL: 57.6 mg/dL (ref >39.00)
LDL Cholesterol: 109 mg/dL — ABNORMAL HIGH (ref 0–99)
NonHDL: 124.2
Total CHOL/HDL Ratio: 3
Triglycerides: 78 mg/dL (ref 0.0–149.0)
VLDL: 15.6 mg/dL (ref 0.0–40.0)

## 2019-01-14 LAB — CBC WITH DIFFERENTIAL/PLATELET
Basophils Absolute: 0.1 10*3/uL (ref 0.0–0.1)
Basophils Relative: 0.8 % (ref 0.0–3.0)
Eosinophils Absolute: 0.2 10*3/uL (ref 0.0–0.7)
Eosinophils Relative: 1.7 % (ref 0.0–5.0)
HCT: 42.9 % (ref 36.0–46.0)
Hemoglobin: 13.9 g/dL (ref 12.0–15.0)
Lymphocytes Relative: 19.5 % (ref 12.0–46.0)
Lymphs Abs: 2.5 10*3/uL (ref 0.7–4.0)
MCHC: 32.5 g/dL (ref 30.0–36.0)
MCV: 90.7 fl (ref 78.0–100.0)
Monocytes Absolute: 0.8 10*3/uL (ref 0.1–1.0)
Monocytes Relative: 5.9 % (ref 3.0–12.0)
Neutro Abs: 9.4 10*3/uL — ABNORMAL HIGH (ref 1.4–7.7)
Neutrophils Relative %: 72.1 % (ref 43.0–77.0)
Platelets: 245 10*3/uL (ref 150.0–400.0)
RBC: 4.73 Mil/uL (ref 3.87–5.11)
RDW: 14.3 % (ref 11.5–15.5)
WBC: 13 10*3/uL — ABNORMAL HIGH (ref 4.0–10.5)

## 2019-01-14 LAB — BASIC METABOLIC PANEL
BUN: 19 mg/dL (ref 6–23)
CO2: 25 mEq/L (ref 19–32)
Calcium: 9.5 mg/dL (ref 8.4–10.5)
Chloride: 104 mEq/L (ref 96–112)
Creatinine, Ser: 1.07 mg/dL (ref 0.40–1.20)
GFR: 63.78 mL/min (ref >60.00)
Glucose, Bld: 123 mg/dL — ABNORMAL HIGH (ref 70–99)
Potassium: 3.5 mEq/L (ref 3.5–5.1)
Sodium: 140 mEq/L (ref 135–145)

## 2019-01-14 LAB — URINALYSIS, ROUTINE W REFLEX MICROSCOPIC
Bilirubin Urine: NEGATIVE
Hgb urine dipstick: NEGATIVE
Ketones, ur: NEGATIVE
Leukocytes,Ua: NEGATIVE
Nitrite: NEGATIVE
RBC / HPF: NONE SEEN (ref 0–?)
Specific Gravity, Urine: >=1.03 — AB (ref 1.000–1.030)
Total Protein, Urine: NEGATIVE
Urine Glucose: NEGATIVE
Urobilinogen, UA: 0.2 (ref 0.0–1.0)
WBC, UA: NONE SEEN (ref 0–?)
pH: 5 (ref 5.0–8.0)

## 2019-01-14 LAB — HEPATIC FUNCTION PANEL
ALT: 32 U/L (ref 0–35)
AST: 25 U/L (ref 0–37)
Albumin: 3.8 g/dL (ref 3.5–5.2)
Alkaline Phosphatase: 107 U/L (ref 39–117)
Bilirubin, Direct: 0.1 mg/dL (ref 0.0–0.3)
Total Bilirubin: 0.5 mg/dL (ref 0.2–1.2)
Total Protein: 7.3 g/dL (ref 6.0–8.3)

## 2019-01-14 LAB — VITAMIN B12: Vitamin B-12: 361 pg/mL (ref 211–911)

## 2019-01-14 LAB — VITAMIN D 25 HYDROXY (VIT D DEFICIENCY, FRACTURES): VITD: 7.7 ng/mL — ABNORMAL LOW (ref 30.00–100.00)

## 2019-01-14 LAB — IBC PANEL
Iron: 64 ug/dL (ref 42–145)
Saturation Ratios: 19.6 % — ABNORMAL LOW (ref 20.0–50.0)
Transferrin: 233 mg/dL (ref 212.0–360.0)

## 2019-01-14 LAB — HEMOGLOBIN A1C: Hgb A1c MFr Bld: 6 % (ref 4.6–6.5)

## 2019-01-14 LAB — TSH: TSH: 1.57 u[IU]/mL (ref 0.35–4.50)

## 2019-01-14 MED ORDER — VITAMIN D (ERGOCALCIFEROL) 1.25 MG (50000 UNIT) PO CAPS: 50000.0000 [IU] | ORAL_CAPSULE | ORAL | 0 refills | Status: DC

## 2019-01-14 MED ORDER — ATORVASTATIN CALCIUM 20 MG PO TABS: 20.0000 mg | ORAL_TABLET | Freq: Every day | ORAL | 3 refills | Status: DC

## 2019-01-14 MED ORDER — ALBUTEROL SULFATE HFA 108 (90 BASE) MCG/ACT IN AERS: INHALATION_SPRAY | RESPIRATORY_TRACT | 5 refills | Status: DC

## 2019-01-14 MED ORDER — APIXABAN 5 MG PO TABS: 5.0000 mg | ORAL_TABLET | Freq: Two times a day (BID) | ORAL | 5 refills | Status: DC

## 2019-01-14 NOTE — Progress Notes (Signed)
[name], female    DOB: 11-25-1960      MRN: [id number]   Brief patient profile:  58 yobf quit smoking in 1990 / [place]  remotely seen by [name] on advair 250 one daily and freq albuterol and then R Plantar fascitis around march 2020 and then toe surgery both sides May 6th 2020 then more sob May 15th > admitted 07/05/2018   Note spirometry 11/2012 nl    Admit date: 07/05/2018 Discharge date: 07/07/2018   Discharge Diagnoses:      Pulmonary embolism ([place])    Extrinsic asthma   Hypertension   Elevated troponin  Brief/Interim Summary:  58 y.o.femalewithhistory of hypertension, hyperlipidemia, asthma previous history of alcohol abuse states she only drinks occasionally nowadays presented to the ER because of nausea vomiting with some epigastric discomfort and burning sensation in the chest over the last 3 days PTA.     just recently had both  great toe nails removed in her left great toe nailbed has become more discolored but no discharge or pain.  ED Course:  CT angiogram of the chest which shows bilateral subsegmental pulmonary embolism with no strain pattern. Patient was started on heparin and admitted for further work-up. Labs also showed elevated LFTs with AST of 70 ALT of 55 bilirubin was 1.7. CT angiogram of the chest done for PE also showed persistent lesion in the liver which as per the radiologist was possible hemangioma.  Hospital course: Pulmonary embolism with no strain pattern per CT chest.  Echocardiogram was done consistent with no severe strain noted, was started on a heparin drip and converted over to Eliquis on discharge.  Dopplers were checked which were negative but TDS.   Elevated troponin; these were marginally elevated at 0.04 and 0.03 the setting of PE.  They remained flat, no acute ischemic changes on EKG.  Patient will follow-up with her PCP for continued outpatient monitoring and further coronary stratification with referral to  cardiology.    History of Present Illness  07/28/2018  Pulmonary/ 1st office eval/[name] re-establish re chronic doe/new pe Chief Complaint  Patient presents with  . Consult    Pulmonary Embolism-H/O asthma has not needed rescue inhaler.  Dyspnea:  MMRC1 = can walk nl pace, flat grade, can't hurry or go uphills or steps s sob   Cough: none Sleep: on back / 2 pillows  SABA use: none on advair one daily Re Call me if trouble getting the eliquis - you need to be on it for 6 full months Weight control is simply a matter of calorie balance    01/14/2019  f/u ov/[name] re: PE  Dx 06/2018 on eliquis, asthma much better  Chief Complaint  Patient presents with  . Follow-up    Breathing is doing well today. She is using her proair inhaler 3 x per wk on average.   Dyspnea:  MMRC1 = can walk nl pace, flat grade, can't hurry or go uphills or steps s sob   Cough: none Sleeping: 2 pillows  SABA use: as above     No obvious day to day or daytime variability or assoc excess/ purulent sputum or mucus plugs or hemoptysis or cp or chest tightness, subjective wheeze or overt sinus or hb symptoms.   Sleeping as above without nocturnal  or early am exacerbation  of respiratory  c/o's or need for noct saba. Also denies any obvious fluctuation of symptoms with weather or environmental changes or other aggravating or alleviating factors except  as outlined above   No unusual exposure hx or h/o childhood pna/ asthma or knowledge of premature birth.  Current Allergies, Complete Past Medical History, Past Surgical History, Family History, and Social History were reviewed in [employer] record.  ROS  The following are not active complaints unless bolded Hoarseness, sore throat, dysphagia, dental problems, itching, sneezing,  nasal congestion or discharge of excess mucus or purulent secretions, ear ache,   fever, chills, sweats, unintended wt loss or wt gain, classically pleuritic or  exertional cp,  orthopnea pnd or arm/hand swelling  or leg swelling, presyncope, palpitations, abdominal pain, anorexia, nausea, vomiting, diarrhea  or change in bowel habits or change in bladder habits, change in stools or change in urine, dysuria, hematuria,  rash, arthralgias, visual complaints, headache, numbness, weakness or ataxia or problems with walking or coordination,  change in mood or  memory.        Current Meds  Medication Sig  . apixaban (ELIQUIS) 5 MG TABS tablet Take 1 tablet (5 mg total) by mouth 2 (two) times daily.  . cimetidine (TAGAMET) 200 MG tablet Take 200 mg by mouth as needed.  . Fluticasone-Salmeterol (ADVAIR DISKUS) 250-50 MCG/DOSE AEPB Inhale 1 puff into the lungs 2 (two) times daily.  Marland Kitchen KLOR-CON M20 20 MEQ tablet TAKE 1 TABLET BY MOUTH EVERY DAY  . LIPITOR 10 MG tablet TAKE 1 TABLET BY MOUTH EVERY DAY  . losartan-hydrochlorothiazide (HYZAAR) 100-25 MG tablet TAKE 1 TABLET BY MOUTH EVERY DAY  . PROAIR HFA 108 (90 Base) MCG/ACT inhaler INHALE 1 TO 2 PUFFS BY MOUTH EVERY 6 HOURS AS NEEDED FOR WHEEZE  . triamcinolone cream (KENALOG) 0.1 % APPLY TO AFFECTED AREA TWICE A DAY            Past Medical History:  Diagnosis Date  . Alcohol abuse    abuse- moderate years ago - states only drinks now on weekends -2 to 8 driinks   . Asthma   . COLONIC POLYPS, HX OF 04/05/2010  . DIVERTICULITIS, HX OF 04/05/2010  . DJD (degenerative joint disease)    right knee, mot to severe  . GERD (gastroesophageal reflux disease)    no meds  . Heart murmur    hx of   . Hyperlipidemia   . Hypertension   . Impaired glucose tolerance 12/06/2013  . Obesity (BMI 30-39.9)   . PALPITATIONS, HX OF 09/14/2007       Objective:     obese pleasant amb bf nad   Wt Readings from Last 3 Encounters:  01/14/19 290 lb (131.5 kg)  01/14/19 292 lb 9.6 oz (132.7 kg)  12/06/18 281 lb 9.6 oz (127.7 kg)     Vital signs reviewed - Note on arrival 02 sats  98% on RA    HEENT : pt wearing mask  not removed for exam due to covid -19 concerns.    NECK :  without JVD/Nodes/TM/ nl carotid upstrokes bilaterally   LUNGS: no acc muscle use,  Nl contour chest which is clear to A and P bilaterally without cough on insp or exp maneuvers   CV:  RRR  no s3 or murmur or increase in P2, and no edema   ABD: Obese/ soft and nontender with nl inspiratory excursion in the supine position. No bruits or organomegaly appreciated, bowel sounds nl  MS:  Nl gait/ ext warm without deformities, calf tenderness, cyanosis or clubbing No obvious joint restrictions   SKIN: warm and dry without lesions  NEURO:  alert, approp, nl sensorium with  no motor or cerebellar deficits apparent.                Assessment

## 2019-01-14 NOTE — Patient Instructions (Addendum)
Only use your albuterol as a rescue medication to be used if you can't catch your breath by resting or doing a relaxed purse lip breathing pattern.  - The less you use it, the better it will work when you need it. - Ok to use up to 2 puffs  every 4 hours if you must but call for immediate appointment if use goes up over your usual need - Don't leave home without it !!  (think of it like the spare tire for your car)   GERD (REFLUX)  is an extremely common cause of respiratory symptoms just like yours , many times with no obvious heartburn at all.    It can be treated with medication, but also with lifestyle changes including elevation of the head of your bed (ideally with 6 -8inch blocks under the headboard of your bed),  Smoking cessation, avoidance of late meals, excessive alcohol, and avoid fatty foods, chocolate, peppermint, colas, red wine, and acidic juices such as orange juice.  NO MINT OR MENTHOL PRODUCTS SO NO COUGH DROPS  USE SUGARLESS CANDY INSTEAD (Jolley ranchers or Stover's or Life Savers) or even ice chips will also do - the key is to swallow to prevent all throat clearing. NO OIL BASED VITAMINS - use powdered substitutes.  Avoid fish oil when coughing.   Please schedule a follow up visit in 6  months but call sooner if needed

## 2019-01-14 NOTE — Patient Instructions (Signed)
Please continue all other medications as before, and refills have been done if requested.  Please have the pharmacy call with any other refills you may need.  Please continue your efforts at being more active, low cholesterol diet, and weight control.  You are otherwise up to date with prevention measures today.  Please keep your appointments with your specialists as you may have planned  Please return in 6 months, or sooner if needed, with Lab testing done 3-5 days before  

## 2019-01-14 NOTE — Progress Notes (Signed)
Subjective:    Patient ID: [name], female    DOB: Aug 27, 1960, 58 y.o.   MRN: [id number]  HPI  Here for wellness and f/u;  Overall doing ok;  Pt denies Chest pain, worsening SOB, DOE, wheezing, orthopnea, PND, worsening LE edema, palpitations, dizziness or syncope.  Pt denies neurological change such as new headache, facial or extremity weakness.  Pt denies polydipsia, polyuria, or low sugar symptoms. Pt states overall good compliance with treatment and medications, good tolerability, and has been trying to follow appropriate diet.  Pt denies worsening depressive symptoms, suicidal ideation or panic. No fever, night sweats, wt loss, loss of appetite, or other constitutional symptoms.  Pt states good ability with ADL's, has low fall risk, home safety reviewed and adequate, no other significant changes in hearing or vision, and only occasionally active with exercise. Wt Readings from Last 3 Encounters:  01/14/19 290 lb (131.5 kg)  01/14/19 292 lb 9.6 oz (132.7 kg)  12/06/18 281 lb 9.6 oz (127.7 kg)  To stay on eliquis for 6 mo per pt per Dr Melvyn Novas pulm.  No new complaints Past Medical History:  Diagnosis Date  . Alcohol abuse    abuse- moderate years ago - states only drinks now on weekends -2 to 8 driinks   . Asthma   . COLONIC POLYPS, HX OF 04/05/2010  . DIVERTICULITIS, HX OF 04/05/2010  . DJD (degenerative joint disease)    right knee, mot to severe  . GERD (gastroesophageal reflux disease)    no meds  . Heart murmur    hx of   . Hyperlipidemia   . Hypertension   . Impaired glucose tolerance 12/06/2013  . Obesity (BMI 30-39.9)   . PALPITATIONS, HX OF 09/14/2007   Past Surgical History:  Procedure Laterality Date  . ABDOMINAL HYSTERECTOMY  age 48   fibroids  . COLONOSCOPY WITH PROPOFOL N/A 07/25/2016   Procedure: COLONOSCOPY WITH PROPOFOL;  Surgeon: Carol Ada, MD;  Location: [facility];  Service: Endoscopy;  Laterality: N/A;  . colonscopy     x 2  . IR RADIOLOGIST EVAL &  MGMT  08/12/2016  . IR RADIOLOGIST EVAL & MGMT  08/21/2016  . KNEE ARTHROSCOPY     left   . TOTAL KNEE ARTHROPLASTY  07/29/2011   Procedure: TOTAL KNEE ARTHROPLASTY;  Surgeon: Mauri Pole, MD;  Location: [facility];  Service: Orthopedics;  Laterality: Right;  . TOTAL KNEE ARTHROPLASTY Left 12/14/2012   Procedure: LEFT TOTAL KNEE ARTHROPLASTY;  Surgeon: Mauri Pole, MD;  Location: [facility];  Service: Orthopedics;  Laterality: Left;    reports that she quit smoking about 30 years ago. Her smoking use included cigarettes. She has a 3.50 pack-year smoking history. She has never used smokeless tobacco. She reports current alcohol use. She reports that she does not use drugs. family history includes COPD in her father; Lymphoma in her sister; Stroke in her mother. Allergies  Allergen Reactions  . Morphine Itching  . Shrimp [Shellfish Allergy] Itching and Other (See Comments)    Tongue burns also  . Tramadol Other (See Comments)    Caused confusion  . Latex Rash   Current Outpatient Medications on File Prior to Visit  Medication Sig Dispense Refill  . KLOR-CON M20 20 MEQ tablet TAKE 1 TABLET BY MOUTH EVERY DAY 30 tablet 2  . losartan-hydrochlorothiazide (HYZAAR) 100-25 MG tablet TAKE 1 TABLET BY MOUTH EVERY DAY 30 tablet 5  . triamcinolone cream (KENALOG) 0.1 % APPLY TO AFFECTED AREA  TWICE A DAY 30 g 0   No current facility-administered medications on file prior to visit.    Review of Systems Constitutional: Negative for other unusual diaphoresis, sweats, appetite or weight changes HENT: Negative for other worsening hearing loss, ear pain, facial swelling, mouth sores or neck stiffness.   Eyes: Negative for other worsening pain, redness or other visual disturbance.  Respiratory: Negative for other stridor or swelling Cardiovascular: Negative for other palpitations or other chest pain  Gastrointestinal: Negative for worsening diarrhea or loose stools, blood in stool, distention or other pain  Genitourinary: Negative for hematuria, flank pain or other change in urine volume.  Musculoskeletal: Negative for myalgias or other joint swelling.  Skin: Negative for other color change, or other wound or worsening drainage.  Neurological: Negative for other syncope or numbness. Hematological: Negative for other adenopathy or swelling Psychiatric/Behavioral: Negative for hallucinations, other worsening agitation, SI, self-injury, or new decreased concentration All otherwise neg per pt     Objective:   Physical Exam BP 124/82   Pulse (!) 122   Temp 98 F (36.7 C) (Oral)   Ht 5\' 2"  (1.575 m)   Wt 290 lb (131.5 kg)   SpO2 97%   BMI 53.04 kg/m  VS noted,  Constitutional: Pt is oriented to person, place, and time. Appears well-developed and well-nourished, in no significant distress and comfortable Head: Normocephalic and atraumatic  Eyes: Conjunctivae and EOM are normal. Pupils are equal, round, and reactive to light Right Ear: External ear normal without discharge Left Ear: External ear normal without discharge Nose: Nose without discharge or deformity Mouth/Throat: Oropharynx is without other ulcerations and moist  Neck: Normal range of motion. Neck supple. No JVD present. No tracheal deviation present or significant neck LA or mass Cardiovascular: Normal rate, regular rhythm, normal heart sounds and intact distal pulses.   Pulmonary/Chest: WOB normal and breath sounds without rales or wheezing  Abdominal: Soft. Bowel sounds are normal. NT. No HSM  Musculoskeletal: Normal range of motion. Exhibits no edema Lymphadenopathy: Has no other cervical adenopathy.  Neurological: Pt is alert and oriented to person, place, and time. Pt has normal reflexes. No cranial nerve deficit. Motor grossly intact, Gait intact Skin: Skin is warm and dry. No rash noted or new ulcerations Psychiatric:  Has normal mood and affect. Behavior is normal without agitation All otherwise neg per pt Lab Results   Component Value Date   WBC 13.0 (H) 01/14/2019   HGB 13.9 01/14/2019   HCT 42.9 01/14/2019   PLT 245.0 01/14/2019   GLUCOSE 123 (H) 01/14/2019   CHOL 182 01/14/2019   TRIG 78.0 01/14/2019   HDL 57.60 01/14/2019   LDLDIRECT 107.0 02/23/2012   LDLCALC 109 (H) 01/14/2019   ALT 32 01/14/2019   AST 25 01/14/2019   NA 140 01/14/2019   K 3.5 01/14/2019   CL 104 01/14/2019   CREATININE 1.07 01/14/2019   BUN 19 01/14/2019   CO2 25 01/14/2019   TSH 1.57 01/14/2019   INR 1.11 07/28/2016   HGBA1C 6.0 01/14/2019       Assessment & Plan:

## 2019-01-15 ENCOUNTER — Encounter: Payer: Self-pay | Admitting: Internal Medicine

## 2019-01-15 NOTE — Assessment & Plan Note (Signed)
stable overall by history and exam, recent data reviewed with pt, and pt to continue medical treatment as before,  to f/u any worsening symptoms or concerns, for a1c 

## 2019-01-15 NOTE — Assessment & Plan Note (Signed)
stable overall by history and exam, recent data reviewed with pt, and pt to continue medical treatment as before,  to f/u any worsening symptoms or concerns  

## 2019-01-15 NOTE — Assessment & Plan Note (Signed)

## 2019-01-16 ENCOUNTER — Encounter: Payer: Self-pay | Admitting: Internal Medicine

## 2019-01-16 NOTE — Assessment & Plan Note (Signed)
Asthma with atopic features and GERD as ppt factors in addition to [place]: 11/2012: FeV1 94% FVC 108% - maint on advair 250 one daily as of 07/28/2018   All goals of chronic asthma control met including optimal function and elimination of symptoms with minimal need for rescue therapy.  Contingencies discussed in full including contacting this office immediately if not controlling the symptoms using the rule of two's.

## 2019-01-16 NOTE — Assessment & Plan Note (Signed)
See CTa  07/06/18 with small clot burden, no RH strain and venous dopplers neg but note TDS due to body habitus and in setting of immobility from quarantine for corona virus/ [place] with recent foot surgery can't r/o DVT   Based on body habitus (which makes using doac a bit iffy as was not studied in [place]) I would favor another 6 m of rx until/ unless sees heme for hypercoagulable w/u and "ok" for either the lower dose or d/c altogether).  Discussed in detail all the  indications, usual  risks and alternatives  relative to the benefits with patient who agrees to proceed with Rx as outlined.

## 2019-01-16 NOTE — Assessment & Plan Note (Signed)
Body mass index is 53.52 kg/m.   Lab Results  Component Value Date   TSH 1.57 01/14/2019     Contributing to gerd risk/ doe/reviewed the need and the process to achieve and maintain neg calorie balance > defer f/u primary care including intermittently monitoring thyroid status    > 50% of 25 min office visit spent on counseling.

## 2019-01-17 NOTE — Addendum Note (Signed)
Addended by: Juliet Rude on: 01/17/2019 07:47 AM   Modules accepted: Orders

## 2019-01-21 ENCOUNTER — Other Ambulatory Visit: Payer: Self-pay | Admitting: Internal Medicine

## 2019-02-01 ENCOUNTER — Ambulatory Visit (INDEPENDENT_AMBULATORY_CARE_PROVIDER_SITE_OTHER): Payer: BC Managed Care – PPO | Admitting: Internal Medicine

## 2019-02-01 ENCOUNTER — Encounter: Payer: Self-pay | Admitting: Internal Medicine

## 2019-02-01 VITALS — BP 133/77 | Temp 96.9°F | Ht 61.5 in | Wt 286.0 lb

## 2019-02-01 DIAGNOSIS — J019 Acute sinusitis, unspecified: Secondary | ICD-10-CM | POA: Diagnosis not present

## 2019-02-01 DIAGNOSIS — R7302 Impaired glucose tolerance (oral): Secondary | ICD-10-CM

## 2019-02-01 DIAGNOSIS — J1282 Pneumonia due to coronavirus disease 2019: Secondary | ICD-10-CM

## 2019-02-01 DIAGNOSIS — J1289 Other viral pneumonia: Secondary | ICD-10-CM | POA: Diagnosis not present

## 2019-02-01 DIAGNOSIS — U071 COVID-19: Secondary | ICD-10-CM

## 2019-02-01 MED ORDER — DOXYCYCLINE HYCLATE 100 MG PO TABS: 100.0000 mg | ORAL_TABLET | Freq: Two times a day (BID) | ORAL | 0 refills | Status: DC

## 2019-02-01 MED ORDER — HYDROCODONE-HOMATROPINE 5-1.5 MG/5ML PO SYRP: 5.0000 mL | ORAL_SOLUTION | Freq: Four times a day (QID) | ORAL | 0 refills | Status: AC | PRN

## 2019-02-01 NOTE — Patient Instructions (Signed)
Please take all new medication as prescribed - the antibiotic, and cough medicine as needed  You can also take Mucinex (or it's generic off brand) for congestion, and tylenol as needed for pain.  Please continue all other medications as before, and refills have been done if requested.  Please have the pharmacy call with any other refills you may need.  Please continue your efforts at being more active, low cholesterol diet, and weight control.  Please keep your appointments with your specialists as you may have planned

## 2019-02-01 NOTE — Progress Notes (Signed)
Patient ID: [name], female   DOB: 21-Dec-1960, 58 y.o.   MRN: [id number]  Phone visit  Cumulative time during 7-day interval 11 min, there was not an associated office visit for this concern within a 7 day period.  Verbal consent for services obtained from patient prior to services given.  Names of all persons present for services: Cathlean Cower, MD, patient  Chief complaint: sinus pain  History, background, results pertinent:  Here with 2-3 days acute onset fever, facial pain, pressure, headache, general weakness and malaise, and greenish d/c, with mild ST and cough, but pt denies chest pain, wheezing, increased sob or doe, orthopnea, PND, increased LE swelling, palpitations, dizziness or syncope.   Pt denies polydipsia, polyuria    Past Medical History:  Diagnosis Date  . Alcohol abuse    abuse- moderate years ago - states only drinks now on weekends -2 to 8 driinks   . Asthma   . COLONIC POLYPS, HX OF 04/05/2010  . DIVERTICULITIS, HX OF 04/05/2010  . DJD (degenerative joint disease)    right knee, mot to severe  . GERD (gastroesophageal reflux disease)    no meds  . Heart murmur    hx of   . Hyperlipidemia   . Hypertension   . Impaired glucose tolerance 12/06/2013  . Obesity (BMI 30-39.9)   . PALPITATIONS, HX OF 09/14/2007   No results found for this or any previous visit (from the past 48 hour(s)). A/P/next steps:   1)  Sinusitis - Mild to mod, for antibx course,  to f/u any worsening symptoms or concerns, also for mucinex, and cough med prn  Cathlean Cower MD

## 2019-02-07 ENCOUNTER — Other Ambulatory Visit: Payer: Self-pay

## 2019-02-07 ENCOUNTER — Emergency Department (HOSPITAL_COMMUNITY): Payer: BC Managed Care – PPO

## 2019-02-07 ENCOUNTER — Emergency Department (HOSPITAL_COMMUNITY)
Admission: EM | Admit: 2019-02-07 | Discharge: 2019-02-07 | Disposition: A | Discharge status: Home / Self Care | Payer: BC Managed Care – PPO | Attending: Emergency Medicine | Admitting: Emergency Medicine

## 2019-02-07 ENCOUNTER — Encounter (HOSPITAL_COMMUNITY): Payer: Self-pay | Admitting: Emergency Medicine

## 2019-02-07 ENCOUNTER — Telehealth: Payer: Self-pay | Admitting: Unknown Physician Specialty

## 2019-02-07 ENCOUNTER — Other Ambulatory Visit: Payer: Self-pay | Admitting: Unknown Physician Specialty

## 2019-02-07 DIAGNOSIS — E876 Hypokalemia: Secondary | ICD-10-CM | POA: Diagnosis not present

## 2019-02-07 DIAGNOSIS — Z91040 Latex allergy status: Secondary | ICD-10-CM | POA: Insufficient documentation

## 2019-02-07 DIAGNOSIS — J45909 Unspecified asthma, uncomplicated: Secondary | ICD-10-CM | POA: Insufficient documentation

## 2019-02-07 DIAGNOSIS — I10 Essential (primary) hypertension: Secondary | ICD-10-CM | POA: Diagnosis not present

## 2019-02-07 DIAGNOSIS — Z79899 Other long term (current) drug therapy: Secondary | ICD-10-CM | POA: Insufficient documentation

## 2019-02-07 DIAGNOSIS — Z87891 Personal history of nicotine dependence: Secondary | ICD-10-CM | POA: Diagnosis not present

## 2019-02-07 DIAGNOSIS — Z7901 Long term (current) use of anticoagulants: Secondary | ICD-10-CM | POA: Diagnosis not present

## 2019-02-07 DIAGNOSIS — R0602 Shortness of breath: Secondary | ICD-10-CM | POA: Diagnosis not present

## 2019-02-07 DIAGNOSIS — Z96653 Presence of artificial knee joint, bilateral: Secondary | ICD-10-CM | POA: Diagnosis not present

## 2019-02-07 DIAGNOSIS — R531 Weakness: Secondary | ICD-10-CM | POA: Diagnosis not present

## 2019-02-07 DIAGNOSIS — U071 COVID-19: Secondary | ICD-10-CM | POA: Diagnosis not present

## 2019-02-07 LAB — I-STAT BETA HCG BLOOD, ED (MC, WL, AP ONLY): I-stat hCG, quantitative: 5.1 m[IU]/mL — ABNORMAL HIGH (ref <5)

## 2019-02-07 LAB — BASIC METABOLIC PANEL
Anion gap: 14 (ref 5–15)
BUN: 15 mg/dL (ref 6–20)
CO2: 26 mmol/L (ref 22–32)
Calcium: 8.8 mg/dL — ABNORMAL LOW (ref 8.9–10.3)
Chloride: 91 mmol/L — ABNORMAL LOW (ref 98–111)
Creatinine, Ser: 1.37 mg/dL — ABNORMAL HIGH (ref 0.44–1.00)
GFR calc Af Amer: 49 mL/min — ABNORMAL LOW (ref >60)
GFR calc non Af Amer: 43 mL/min — ABNORMAL LOW (ref >60)
Glucose, Bld: 119 mg/dL — ABNORMAL HIGH (ref 70–99)
Potassium: 3.9 mmol/L (ref 3.5–5.1)
Sodium: 131 mmol/L — ABNORMAL LOW (ref 135–145)

## 2019-02-07 LAB — CBC
HCT: 49.8 % — ABNORMAL HIGH (ref 36.0–46.0)
Hemoglobin: 16 g/dL — ABNORMAL HIGH (ref 12.0–15.0)
MCH: 28.6 pg (ref 26.0–34.0)
MCHC: 32.1 g/dL (ref 30.0–36.0)
MCV: 88.9 fL (ref 80.0–100.0)
Platelets: 178 10*3/uL (ref 150–400)
RBC: 5.6 MIL/uL — ABNORMAL HIGH (ref 3.87–5.11)
RDW: 13.6 % (ref 11.5–15.5)
WBC: 8.9 10*3/uL (ref 4.0–10.5)
nRBC: 0 % (ref 0.0–0.2)

## 2019-02-07 LAB — HEPATIC FUNCTION PANEL
ALT: 47 U/L — ABNORMAL HIGH (ref 0–44)
AST: 55 U/L — ABNORMAL HIGH (ref 15–41)
Albumin: 3.2 g/dL — ABNORMAL LOW (ref 3.5–5.0)
Alkaline Phosphatase: 98 U/L (ref 38–126)
Bilirubin, Direct: 0.2 mg/dL (ref 0.0–0.2)
Indirect Bilirubin: 0.6 mg/dL (ref 0.3–0.9)
Total Bilirubin: 0.8 mg/dL (ref 0.3–1.2)
Total Protein: 7.4 g/dL (ref 6.5–8.1)

## 2019-02-07 LAB — CBG MONITORING, ED: Glucose-Capillary: 126 mg/dL — ABNORMAL HIGH (ref 70–99)

## 2019-02-07 LAB — LACTIC ACID, PLASMA: Lactic Acid, Venous: 1.9 mmol/L (ref 0.5–1.9)

## 2019-02-07 LAB — POC SARS CORONAVIRUS 2 AG -  ED: SARS Coronavirus 2 Ag: POSITIVE — AB

## 2019-02-07 MED ORDER — SODIUM CHLORIDE 0.9 % IV BOLUS
1000.0000 mL | Freq: Once | INTRAVENOUS | Status: AC
  Administered 2019-02-07: 1000 mL via INTRAVENOUS

## 2019-02-07 MED ORDER — SODIUM CHLORIDE 0.9 % IV SOLN
1.0000 g | Freq: Once | INTRAVENOUS | Status: AC
  Administered 2019-02-07: 1 g via INTRAVENOUS
  Filled 2019-02-07: qty 10

## 2019-02-07 MED ORDER — METHOCARBAMOL 500 MG PO TABS: 500.0000 mg | ORAL_TABLET | Freq: Two times a day (BID) | ORAL | 0 refills | Status: DC

## 2019-02-07 MED ORDER — LORAZEPAM 2 MG/ML IJ SOLN
1.0000 mg | Freq: Once | INTRAMUSCULAR | Status: AC
  Administered 2019-02-07: 1 mg via INTRAVENOUS
  Filled 2019-02-07: qty 1

## 2019-02-07 MED ORDER — SODIUM CHLORIDE 0.9% FLUSH: 3.0000 mL | Freq: Once | INTRAVENOUS | Status: DC

## 2019-02-07 MED ORDER — LORAZEPAM 2 MG/ML IJ SOLN
0.5000 mg | Freq: Once | INTRAMUSCULAR | Status: AC
  Administered 2019-02-07: 0.5 mg via INTRAVENOUS
  Filled 2019-02-07: qty 1

## 2019-02-07 MED ORDER — POTASSIUM CHLORIDE CRYS ER 20 MEQ PO TBCR
40.0000 meq | EXTENDED_RELEASE_TABLET | Freq: Once | ORAL | Status: DC
  Filled 2019-02-07: qty 2

## 2019-02-07 MED ORDER — SODIUM CHLORIDE 0.9 % IV SOLN
500.0000 mg | Freq: Once | INTRAVENOUS | Status: DC
  Filled 2019-02-07: qty 500

## 2019-02-07 NOTE — ED Notes (Signed)
Pt also states she has ETOH abuse w/hx of withdrawals. Last drink yesterday, usually drinks 1/2 pint of liquor daily

## 2019-02-07 NOTE — Telephone Encounter (Signed)
  I connected by phone with [name] on 02/07/2019 at 10:17 AM to discuss the potential use of an new treatment for mild to moderate COVID-19 viral infection in non-hospitalized patients.  This patient is a 58 y.o. female that meets the FDA criteria for Emergency Use Authorization of bamlanivimab or casirivimab\imdevimab.  Has a (+) direct SARS-CoV-2 viral test result  Has mild or moderate COVID-19   Is ? 57 years of age and weighs ? 40 kg  Is NOT hospitalized due to COVID-19  Is NOT requiring oxygen therapy or requiring an increase in baseline oxygen flow rate due to COVID-19  Is within 10 days of symptom onset  Has at least one of the high risk factor(s) for progression to severe COVID-19 and/or hospitalization as defined in EUA.  Specific high risk criteria : Diabetes   I have spoken and communicated the following to the patient or parent/caregiver:  1. FDA has authorized the emergency use of bamlanivimab and casirivimab\imdevimab for the treatment of mild to moderate COVID-19 in adults and pediatric patients with positive results of direct SARS-CoV-2 viral testing who are 53 years of age and older weighing at least 40 kg, and who are at high risk for progressing to severe COVID-19 and/or hospitalization.  2. The significant known and potential risks and benefits of bamlanivimab and casirivimab\imdevimab, and the extent to which such potential risks and benefits are unknown.  3. Information on available alternative treatments and the risks and benefits of those alternatives, including clinical trials.  4. Patients treated with bamlanivimab and casirivimab\imdevimab should continue to self-isolate and use infection control measures (e.g., wear mask, isolate, social distance, avoid sharing personal items, clean and disinfect "high touch" surfaces, and frequent handwashing) according to CDC guidelines.   5. The patient or parent/caregiver has the option to accept or refuse  bamlanivimab or casirivimab\imdevimab .  After reviewing this information with the patient, The patient agreed to proceed with receiving the bamlanimivab infusion and will be provided a copy of the Fact sheet prior to receiving the infusion.[name] 02/07/2019 10:17 AM Symptom onset 12/25

## 2019-02-07 NOTE — ED Notes (Signed)
Pt has had anxiety like episodes even with Ativan, feels at times like she is going to pass out, although VS remain stable.  Pt does admit to increased ETOH during the holidays but has not had in over a week.  Last drink yesterday.

## 2019-02-07 NOTE — Discharge Instructions (Addendum)
Please answer all phone calls as follow up outpatient treatment is being arranged.  Drink plenty of fluids and remain quarantined as per instructions. Return if you have worsening symptoms. You can be with others after  At least 10 days since symptoms first appeared and At least 24 hours with no fever without fever-reducing medication and Other symptoms of COVID-19 are improving**Loss of taste and smell may persist for weeks or months after recovery and need not delay the end of isolation

## 2019-02-07 NOTE — ED Provider Notes (Signed)
[facility] EMERGENCY DEPARTMENT Provider Note   CSN: [id number] Arrival date & time: 02/07/19  0535     History Chief Complaint  Patient presents with  . Shortness of Breath  . Weakness    [name] is a 58 y.o. female.  HPI 58 year old female history of alcohol abuse, hypertension, hyperlipidemia, obesity presents today complaining of generalized weakness.  She states that she began having some runny nose, nasal congestion, nasal discharge 5 days ago.  She has been increasingly weak over the past 3 days.  Today though her weakness is worse.  The weakness is generalized in nature.  She denies headache, sore throat, decreased taste, cough, or abdominal pain.  She endorses some nausea, decreased appetite, frequent bowel movements consistent with diarrhea, and increased frequency of urination although no pain with urination.  She denies any exposure to Covid.  She reports that she lives alone with her husband.  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  She states that she does go out but limits this to early morning and wears a mask.  She has a history of alcoholism, but states that has been under fairly good control over the past several months.  She did drink a pint of liquor yesterday.  She reports that she has had some withdrawal in the past, but is always able to "get through it".  She states that she has been sober for multiple days at a time over the past month.  She has been limiting her drinking to weekends.     7:57 AM Patient states she has a rectovesicular  Primary care is Dr. Jenny Reichmann. Past Medical History:  Diagnosis Date  . Alcohol abuse    abuse- moderate years ago - states only drinks now on weekends -2 to 8 driinks   . Asthma   . COLONIC POLYPS, HX OF 04/05/2010  . DIVERTICULITIS, HX OF 04/05/2010  . DJD (degenerative joint disease)    right knee, mot to severe  . GERD (gastroesophageal reflux disease)    no meds  . Heart murmur    hx of   . Hyperlipidemia   .  Hypertension   . Impaired glucose tolerance 12/06/2013  . Obesity (BMI 30-39.9)   . PALPITATIONS, HX OF 09/14/2007    Patient Active Problem List   Diagnosis Date Noted  . Pulmonary embolism ([place]) 07/06/2018  . Elevated troponin 07/06/2018  . Leukocytosis 09/22/2017  . Abnormal urine color 09/22/2017  . Rash 09/22/2017  . Sepsis ([place]) 09/12/2016  . Colovesical fistula 09/12/2016  . Hypertension 07/28/2016  . Alcohol abuse 07/28/2016  . Obesity (BMI 30-39.9) 07/28/2016  . Abscess of bladder 07/28/2016  . Colonic diverticulum 07/28/2016  . Acute hypokalemia 07/28/2016  . Hyperlipidemia 07/10/2016  . Mass of left side of neck 07/10/2016  . Head and neck lymphadenopathy 07/10/2016  . Acute sinus infection 01/25/2016  . Eustachian tube disorder 01/25/2016  . Asthma exacerbation 05/18/2015  . Peripheral edema 03/16/2015  . Asthma 03/16/2015  . Right shoulder pain 03/16/2015  . Hypokalemia 10/04/2014  . Upper airway cough syndrome 10/03/2014  . Severe obesity (BMI >= 40) ([place]) 10/03/2014  . Lower back pain 05/25/2014  . Bilateral shoulder pain 05/25/2014  . Chest pain 04/14/2014  . Allergic rhinitis 12/06/2013  . Impaired glucose tolerance 12/06/2013  . Expected blood loss anemia 12/16/2012  . Morbid obesity ([place]) 12/15/2012  . S/P left TKA 12/14/2012  . Goiter 11/26/2012  . Preop exam for internal medicine 11/26/2012  . Diarrhea 02/25/2012  .  Vaginal bleeding 04/30/2011  . Colon polyps 02/10/2011  . Eczema 02/10/2011  . Depression 02/10/2011  . Preventative health care 09/27/2010  . Anxiety state 04/05/2010  . DIVERTICULITIS, HX OF 04/05/2010  . PALPITATIONS, HX OF 09/14/2007  . Obesity 04/24/2007  . VOCAL CORD DISORDER 04/24/2007  . Extrinsic asthma 04/24/2007  . GERD 04/24/2007    Past Surgical History:  Procedure Laterality Date  . ABDOMINAL HYSTERECTOMY  age 42   fibroids  . COLONOSCOPY WITH PROPOFOL N/A 07/25/2016   Procedure: COLONOSCOPY WITH PROPOFOL;   Surgeon: Carol Ada, MD;  Location: [facility];  Service: Endoscopy;  Laterality: N/A;  . colonscopy     x 2  . IR RADIOLOGIST EVAL & MGMT  08/12/2016  . IR RADIOLOGIST EVAL & MGMT  08/21/2016  . KNEE ARTHROSCOPY     left   . TOTAL KNEE ARTHROPLASTY  07/29/2011   Procedure: TOTAL KNEE ARTHROPLASTY;  Surgeon: Mauri Pole, MD;  Location: [facility];  Service: Orthopedics;  Laterality: Right;  . TOTAL KNEE ARTHROPLASTY Left 12/14/2012   Procedure: LEFT TOTAL KNEE ARTHROPLASTY;  Surgeon: Mauri Pole, MD;  Location: [facility];  Service: Orthopedics;  Laterality: Left;     OB History    Gravida  1   Para  1   Term      Preterm      AB      Living  1     SAB      TAB      Ectopic      Multiple      Live Births              Family History  Problem Relation Age of Onset  . Stroke Mother   . COPD Father   . Lymphoma Sister     Social History   Tobacco Use  . Smoking status: Former Smoker    Packs/day: 0.50    Years: 7.00    Pack years: 3.50    Types: Cigarettes    Quit date: 02/11/1988    Years since quitting: 31.0  . Smokeless tobacco: Never Used  Substance Use Topics  . Alcohol use: Yes    Comment: Occas  . Drug use: No    Comment: smoked marajuania x 10 years.  Quit in 1985.    Home Medications Prior to Admission medications   Medication Sig Start Date End Date Taking? Authorizing Provider  ADVAIR DISKUS 250-50 MCG/DOSE AEPB TAKE 1 PUFF BY MOUTH TWICE A DAY 01/14/19   Biagio Borg, MD  albuterol [facility] HFA) 108 (90 Base) MCG/ACT inhaler INHALE 1 TO 2 PUFFS BY MOUTH EVERY 6 HOURS AS NEEDED FOR WHEEZE 01/14/19   Tanda Rockers, MD  apixaban (ELIQUIS) 5 MG TABS tablet Take 1 tablet (5 mg total) by mouth 2 (two) times daily. 01/14/19   Tanda Rockers, MD  atorvastatin (LIPITOR) 20 MG tablet Take 1 tablet (20 mg total) by mouth daily. 01/14/19 01/14/20  Biagio Borg, MD  cimetidine (TAGAMET) 200 MG tablet Take 200 mg by mouth as needed.    [provider]  doxycycline (VIBRA-TABS) 100 MG tablet Take 1 tablet (100 mg total) by mouth 2 (two) times daily. 02/01/19   Biagio Borg, MD  HYDROcodone-homatropine [facility]) 5-1.5 MG/5ML syrup Take 5 mLs by mouth every 6 (six) hours as needed for up to 10 days for cough. 02/01/19 02/11/19  Biagio Borg, MD  KLOR-CON M20 20 MEQ tablet TAKE 1 TABLET  BY MOUTH EVERY DAY 10/20/18   Biagio Borg, MD  losartan-hydrochlorothiazide [facility]) 100-25 MG tablet TAKE 1 TABLET BY MOUTH EVERY DAY 10/19/18   Biagio Borg, MD  triamcinolone cream (KENALOG) 0.1 % APPLY TO AFFECTED AREA TWICE A DAY 01/21/19   Biagio Borg, MD    Allergies    Morphine, Shrimp [shellfish allergy], Tramadol, and Latex  Review of Systems   Review of Systems  Physical Exam Updated Vital Signs BP (!) 86/58   Pulse (!) 105   Temp 98 F (36.7 C) (Oral)   Resp (!) 26   Wt 129.7 kg   SpO2 94%   BMI 53.15 kg/m   Physical Exam Vitals and nursing note reviewed.  Constitutional:      General: She is not in acute distress.    Appearance: She is well-developed. She is obese. She is not ill-appearing.  HENT:     Head: Normocephalic and atraumatic.     Mouth/Throat:     Mouth: Mucous membranes are moist.  Eyes:     Pupils: Pupils are equal, round, and reactive to light.  Cardiovascular:     Rate and Rhythm: Regular rhythm. Tachycardia present.  Pulmonary:     Effort: Pulmonary effort is normal.     Breath sounds: Normal breath sounds.  Abdominal:     Palpations: Abdomen is soft.  Musculoskeletal:        General: Normal range of motion.     Cervical back: Normal range of motion.  Skin:    General: Skin is warm and dry.     Capillary Refill: Capillary refill takes less than 2 seconds.  Neurological:     General: No focal deficit present.     Mental Status: She is alert.  Psychiatric:        Mood and Affect: Mood normal.        Behavior: Behavior normal.     ED Results / Procedures / Treatments   Labs (all labs  ordered are listed, but only abnormal results are displayed) Labs Reviewed  BASIC METABOLIC PANEL - Abnormal; Notable for the following components:      Result Value   Sodium 131 (*)    Chloride 91 (*)    Glucose, Bld 119 (*)    Creatinine, Ser 1.37 (*)    Calcium 8.8 (*)    GFR calc non Af Amer 43 (*)    GFR calc Af Amer 49 (*)    All other components within normal limits  CBC - Abnormal; Notable for the following components:   RBC 5.60 (*)    Hemoglobin 16.0 (*)    HCT 49.8 (*)    All other components within normal limits  CBG MONITORING, ED - Abnormal; Notable for the following components:   Glucose-Capillary 126 (*)    All other components within normal limits  I-STAT BETA HCG BLOOD, ED ([facility], [facility], [facility] ONLY) - Abnormal; Notable for the following components:   I-stat hCG, quantitative 5.1 (*)    All other components within normal limits  CULTURE, BLOOD (ROUTINE X 2)  CULTURE, BLOOD (ROUTINE X 2)  URINALYSIS, ROUTINE W REFLEX MICROSCOPIC  HEPATIC FUNCTION PANEL  POC SARS CORONAVIRUS 2 AG -  ED    EKG EKG Interpretation  Date/Time:  Monday February 07 2019 05:44:51 EST Ventricular Rate:  119 PR Interval:  140 QRS Duration: 90 QT Interval:  332 QTC Calculation: 467 R Axis:   66 Text Interpretation: Sinus tachycardia with Premature atrial complexes  with Abberant conduction Cannot rule out Anterior infarct , age undetermined Abnormal ECG Confirmed by Pattricia Boss [phone]) on 02/07/2019 7:59:53 AM   Radiology DG Chest 2 View  Result Date: 02/07/2019 CLINICAL DATA:  Shortness of breath for 1 day EXAM: CHEST - 2 VIEW COMPARISON:  08/17/2018 FINDINGS: Mild patchy pulmonary infiltrate mainly in the peripheral left lung but also seen at the right base. Borderline heart size that is stable. No effusion or pneumothorax. Rounded densities over the left based on the frontal view appear artifactual, possibly buttons. IMPRESSION: Mild bilateral pulmonary infiltrate, suspect atypical  pneumonia. Electronically Signed   By: Monte Fantasia M.D.   On: 02/07/2019 06:51    Procedures Procedures (including critical care time)  Medications Ordered in ED Medications  sodium chloride flush (NS) 0.9 % injection 3 mL (has no administration in time range)  sodium chloride 0.9 % bolus 1,000 mL (has no administration in time range)    ED Course  I have reviewed the triage vital signs and the nursing notes.  Pertinent labs & imaging results that were available during my care of the patient were reviewed by me and considered in my medical decision making (see chart for details).    MDM Rules/Calculators/A&P                     45 female presents today with generalized weakness, some upper respiratory infection symptoms, as well as poor appetite and diarrhea.  Patient has been borderline hypotensive with her last systolic blood pressure 92 last at the bedside.  However, there were some blood pressures in the 80s and the last blood pressure noted was 86/58.  Of note, the cuff is on her right forearm. Patient is awake alert and talking to me.,  However, with her report of infectious type symptoms, low blood pressure and tachycardia, she has having blood cultures obtained, lactic acid, and received a liter of normal saline.  Review of chest x-Debhora Titus also reveals multiple bilateral pulmonary infiltrates and will cover with antibiotics as well as obtain COVID-19 test On reevaluation patient's heart rate 100, temperature 98, sats 98%, blood pressure 126/61 TEFFANY OBANDO was evaluated in Emergency Department on 02/07/2019 for the symptoms described in the history of present illness. She was evaluated in the context of the global COVID-19 pandemic, which necessitated consideration that the patient might be at risk for infection with the SARS-CoV-2 virus that causes COVID-19. Institutional protocols and algorithms that pertain to the evaluation of patients at risk for COVID-19 are in a state of rapid  change based on information released by regulatory bodies including the CDC and federal and state organizations. These policies and algorithms were followed during the patient's care in the ED. Discussed with Dr. Baxter Flattery and advises patient candidate for mab.  Discussed with Jena Gauss, coordinator and will arrange. Discussed above with patient We also discussed quarantining as well as her husband's need to isolate.  We have discussed return precautions need for follow-up and she voices understanding. 1- Covid 19 infection-d.w. id and mcab op process initiated. 2- tachycardia- likely secondary to #1 3- mild hypokalemia with oral repletion here and will need f/u 4-mildly elevated transaminases- likely secondary to alcohol abuse 5-etoh abuse- patient given dose of ativan here and hr decreased with ativan and fluids    Final Clinical Impression(s) / ED Diagnoses Final diagnoses:  COVID-19  Hypokalemia    Rx / DC Orders ED Discharge Orders    None  Pattricia Boss, MD 02/07/19 [phone]

## 2019-02-07 NOTE — ED Notes (Signed)
This tech reported pt's low BP to RN triage nurse Webb Silversmith.

## 2019-02-07 NOTE — ED Notes (Signed)
Pt less anxious, but still c/o generalized muscle cramping to extremities.  Instructed patient that she received medication.

## 2019-02-07 NOTE — ED Triage Notes (Signed)
Pt in with sob x 1 day, states cough with brown sputum x 3 days. Denies any cp or n/v/d. Pt states she has had poor appetite x 5 days, feels weak. Denies any other respiratory symptoms or sick contacts. BP 109/67 sitting, HR 118, temp 98

## 2019-02-08 ENCOUNTER — Telehealth: Payer: Self-pay | Admitting: Internal Medicine

## 2019-02-08 DIAGNOSIS — J4530 Mild persistent asthma, uncomplicated: Secondary | ICD-10-CM

## 2019-02-08 DIAGNOSIS — U071 COVID-19: Secondary | ICD-10-CM

## 2019-02-08 DIAGNOSIS — I2782 Chronic pulmonary embolism: Secondary | ICD-10-CM

## 2019-02-08 MED ORDER — DEXAMETHASONE 6 MG PO TABS: 6.0000 mg | ORAL_TABLET | Freq: Two times a day (BID) | ORAL | 0 refills | Status: DC

## 2019-02-08 MED ORDER — ADVAIR HFA 115-21 MCG/ACT IN AERO: 2.0000 | INHALATION_SPRAY | Freq: Two times a day (BID) | RESPIRATORY_TRACT | 3 refills | Status: DC

## 2019-02-08 NOTE — Telephone Encounter (Signed)
I checked and it doesn't but delsym is the strongest made and it is liquid   Also advair powder tends to make cough worse so would change to advair hfa 115 2bid when coughing bad enough to need  cough meds

## 2019-02-08 NOTE — Telephone Encounter (Signed)
Called and spoke to pt. Informed her of the recs per MW. Pt states she thinks she is suppose to get a call today or tomorrow to set up a time for her to receive the antibody infusion.   Dr. Melvyn Novas, should we still send in Decadron? If so, what is the duration. Thanks.

## 2019-02-08 NOTE — Telephone Encounter (Signed)
Dr. Melvyn Novas,  This patient was last seen by you on 01/14/19 for chronic pulmonary embolism. Based on those notes and her request, please advise of recommendations that can be given to the patient.  Also, there is a notation she is on Eliquis 5 mg BID.

## 2019-02-08 NOTE — Telephone Encounter (Signed)
Strict quarantine x at least 10 days   If breathing gets some  worse > decadron 6 mg daily with bfast is the only medication we can call in but should continue all other meds the same as usual  If breathing gets lots worse > to ER where they can decide admit vs outpt infusion option

## 2019-02-08 NOTE — Telephone Encounter (Signed)
I called the patient and made her aware of Dr. Gustavus Bryant response. She stated she was given Hycodan by the hospital. Advised the patient that cough syrup was stronger than the recommended Delsym and per Dr. Melvyn Novas to take the medication issued by the hospital. Patient stated she wanted to make sure she had something to help control the cough so she can sleep at night.  Advised the patient that he also wanted to adjust the dosage of the Advair and a new prescription was being sent to the pharmacy.  The patient stated that she noticed her blood pressure was up to 127/84 and as low as 100/67. Call was placed on hold and made Dr. Melvyn Novas aware of this, he stated that those numbers were fine as long as she is not feeling dizzy or lightheaded when she gets up.  I made the patient aware of this and she voiced understanding. Nothing further needed at this time.

## 2019-02-08 NOTE — Telephone Encounter (Signed)
Spoke with the pt and rec that she stay away from cough drops and instead use sugar free hard candy instead or ice chips, sips of water  She verbalized understanding  Nothing further needed

## 2019-02-08 NOTE — Telephone Encounter (Signed)
Dr. Melvyn Novas, please advise if you know if tessalon comes in a liquid cough syrup form as pt is asking about this.

## 2019-02-08 NOTE — Telephone Encounter (Signed)
Pt has more questions.  Pt would like to know if she should get up & move around, etc.  Pt can be reached at [phone].

## 2019-02-08 NOTE — Telephone Encounter (Signed)
She does not need the antibody yet based on her symptoms  Only if breathing getting worse should she start the dex at 6 mg daily x 10 days   Set up for televist if further questions - perhaps 03/12/2018 to check on her progress before the weekend, sooner if needed

## 2019-02-08 NOTE — Telephone Encounter (Signed)
I called the patient back and made her aware of Dr. Gustavus Bryant  response. She has been set up for a televisit on 02/10/19 at 10:00. The prescription has been sent to the pharmacy and I made her aware she was only to take if her breathing got worse. And if it still worsens to go to the ER.  Patient voiced understanding. Nothing further needed.

## 2019-02-08 NOTE — Telephone Encounter (Signed)
Called and spoke to pt. Advised pt to listen to her body, try and rest as much as she can and stay hydrated. Pt verbalized understanding. Will await for MW's response on the Decadron.

## 2019-02-10 ENCOUNTER — Ambulatory Visit (INDEPENDENT_AMBULATORY_CARE_PROVIDER_SITE_OTHER): Payer: BC Managed Care – PPO | Admitting: Internal Medicine

## 2019-02-10 ENCOUNTER — Other Ambulatory Visit: Payer: Self-pay

## 2019-02-10 DIAGNOSIS — U071 COVID-19: Secondary | ICD-10-CM | POA: Insufficient documentation

## 2019-02-10 DIAGNOSIS — I2782 Chronic pulmonary embolism: Secondary | ICD-10-CM | POA: Diagnosis not present

## 2019-02-10 DIAGNOSIS — J1282 Pneumonia due to coronavirus disease 2019: Secondary | ICD-10-CM | POA: Insufficient documentation

## 2019-02-10 DIAGNOSIS — J1289 Other viral pneumonia: Secondary | ICD-10-CM

## 2019-02-10 DIAGNOSIS — F10920 Alcohol use, unspecified with intoxication, uncomplicated: Secondary | ICD-10-CM

## 2019-02-10 MED ORDER — ONDANSETRON 4 MG PO TBDP: 4.0000 mg | ORAL_TABLET | Freq: Three times a day (TID) | ORAL | 0 refills | Status: DC | PRN

## 2019-02-10 NOTE — Assessment & Plan Note (Addendum)
Onset of symptoms ? 02/01/19 > to ER 12/28/20with bilateral infiltrates  - see televisit 02/10/2019 if condition worsens then rec dex 6 mg daily x 10 days and if not responding to ER to consider regeneron infusion.    Reviewed approp monitoring and rx for symptms eg zofran for nause, clear liquids/ prn saba  And dex as above.  Each maintenance medication was reviewed in detail including most importantly the difference between maintenance and as needed and under what circumstances the prns are to be used.  Please see AVS for specific  Instructions which are unique to this visit and I personally typed out  which were reviewed in detail over the phone with the patient and a copy provided via myChart

## 2019-02-10 NOTE — Progress Notes (Signed)
[name], female    DOB: 01-17-1961      MRN: [id number]   Brief patient profile:  58 yobf quit smoking in 1990 / [place]  remotely seen by [name] on advair 250 one daily and freq albuterol and then R Plantar fascitis around march 2020 and then toe surgery both sides May 6th 2020 then more sob May 15th > admitted 07/05/2018   Note spirometry 11/2012 nl    Admit date: 07/05/2018 Discharge date: 07/07/2018   Discharge Diagnoses:     Pulmonary embolism ([place])  Extrinsic asthma   Hypertension   Elevated troponin  Brief/Interim Summary:  58 y.o.femalewithhistory of hypertension, hyperlipidemia, asthma previous history of alcohol abuse states she only drinks occasionally nowadays presented to the ER because of nausea vomiting with some epigastric discomfort and burning sensation in the chest over the last 3 days PTA.     just recently had both  great toe nails removed in her left great toe nailbed has become more discolored but no discharge or pain.  ED Course:  CT angiogram of the chest which shows bilateral subsegmental pulmonary embolism with no strain pattern. Patient was started on heparin and admitted for further work-up. Labs also showed elevated LFTs with AST of 70 ALT of 55 bilirubin was 1.7. CT angiogram of the chest done for PE also showed persistent lesion in the liver which as per the radiologist was possible hemangioma.  Hospital course: Pulmonary embolism with no strain pattern per CT chest.  Echocardiogram was done consistent with no severe strain noted, was started on a heparin drip and converted over to Eliquis on discharge.  Dopplers were checked which were negative but TDS.   Elevated troponin; these were marginally elevated at 0.04 and 0.03 the setting of PE.  They remained flat, no acute ischemic changes on EKG.  Patient will follow-up with her PCP for continued outpatient monitoring and further coronary stratification with referral to  cardiology.    History of Present Illness  07/28/2018  Pulmonary/ 1st office eval/[name] re-establish re chronic doe/new pe Chief Complaint  Patient presents with  . Consult    Pulmonary Embolism-H/O asthma has not needed rescue inhaler.  Dyspnea:  MMRC1 = can walk nl pace, flat grade, can't hurry or go uphills or steps s sob   Cough: none Sleep: on back / 2 pillows  SABA use: none on advair one daily Re Call me if trouble getting the eliquis - you need to be on it for 6 full months Weight control is simply a matter of calorie balance    01/14/2019  f/u ov/[name] re: PE  Dx 06/2018 on eliquis, asthma much better  Chief Complaint  Patient presents with  . Follow-up    Breathing is doing well today. She is using her proair inhaler 3 x per wk on average.   Dyspnea:  MMRC1 = can walk nl pace, flat grade, can't hurry or go uphills or steps s sob   Cough: none Sleeping: 2 pillows  SABA use: as above  rec Only use your albuterol as a rescue medication   GERD  Diet     Onset of anorexia 02/01/2019   ER 02/07/2019 with Mild bilateral pulmonary infiltrate, suspect atypical pneumonia. Pos Covid    Virtual Visit via Telephone Note 02/10/2019   I connected with [name] on 02/10/19 at 10:00 AM EST by telephone and verified that I am speaking with the correct person using two identifiers.   I discussed  the limitations, risks, security and privacy concerns of performing an evaluation and management service by telephone and the availability of in person appointments. I also discussed with the patient that there may be a patient responsible charge related to this service. The patient expressed understanding and agreed to proceed.   History of Present Illness: asthma/ covid 19 pna Changed to advair hf 115 -21 2bid due to cough some better Dyspnea:  Mild doe room to room  Cough: mucoid only  Sleeping: on side with 2 pillows  SABA use: 5 x 1 puffs each per day  now vs 1-2 weekly at  baseline > uses more to to control cough than wheeze/ sob  02: none with sats 95% Nausea persists since ER, was better on zofran sl   No obvious day to day or daytime variability or assoc excess/ purulent sputum or mucus plugs or hemoptysis or cp or chest tightness, subjective wheeze or overt sinus or hb symptoms.    Also denies any obvious fluctuation of symptoms with weather or environmental changes or other aggravating or alleviating factors except as outlined above.   Meds reviewed/ med reconciliation completed      Observations/Objective: No conversational dyspnea, minimally congested cough on voluntary maneuver, good voice texture  Assessment and Plan: See problem list for active a/p's   Follow Up Instructions: See avs for instructions unique to this ov which includes revised/ updated med list     I discussed the assessment and treatment plan with the patient. The patient was provided an opportunity to ask questions and all were answered. The patient agreed with the plan and demonstrated an understanding of the instructions.   The patient was advised to call back or seek an in-person evaluation if the symptoms worsen or if the condition fails to improve as anticipated.  I provided 25 minutes of non-face-to-face time during this encounter.   Susan Gully, MD        Past Medical History:  Diagnosis Date  . Alcohol abuse    abuse- moderate years ago - states only drinks now on weekends -2 to 8 driinks   . Asthma   . COLONIC POLYPS, HX OF 04/05/2010  . DIVERTICULITIS, HX OF 04/05/2010  . DJD (degenerative joint disease)    right knee, mot to severe  . GERD (gastroesophageal reflux disease)    no meds  . Heart murmur    hx of   . Hyperlipidemia   . Hypertension   . Impaired glucose tolerance 12/06/2013  . Obesity (BMI 30-39.9)   . PALPITATIONS, HX OF 09/14/2007

## 2019-02-10 NOTE — Patient Instructions (Addendum)
If condition worsens Decadron 6 mg daily x 10 days   zofran 4 mg under you tongue as needed for nausea up to every 8 hours    Try to eat more broth based soups, crackers until appetite improves  Only use your albuterol as a rescue medication to be used if you can't catch your breath by resting or doing a relaxed purse lip breathing pattern.  - The less you use it, the better it will work when you need it. - Ok to use up to 2 puffs  every 4 hours if you must but call for immediate appointment if use goes up over your usual need - Don't leave home without it !!  (think of it like the spare tire for your car)    Keep your previous appt or 6 months, whichever comes first

## 2019-02-11 ENCOUNTER — Encounter: Payer: Self-pay | Admitting: Internal Medicine

## 2019-02-11 DIAGNOSIS — F10920 Alcohol use, unspecified with intoxication, uncomplicated: Secondary | ICD-10-CM | POA: Insufficient documentation

## 2019-02-11 NOTE — Assessment & Plan Note (Signed)
covid neg x 1 mo,  to f/u any worsening symptoms or concerns

## 2019-02-11 NOTE — Assessment & Plan Note (Signed)
Mild to mod, for antibx course,  to f/u any worsening symptoms or concerns 

## 2019-02-11 NOTE — Assessment & Plan Note (Signed)
stable overall by history and exam, recent data reviewed with pt, and pt to continue medical treatment as before,  to f/u any worsening symptoms or concerns  

## 2019-02-12 LAB — CULTURE, BLOOD (ROUTINE X 2)
Culture: NO GROWTH
Culture: NO GROWTH
Special Requests: ADEQUATE
Special Requests: ADEQUATE

## 2019-02-22 ENCOUNTER — Telehealth: Payer: Self-pay | Admitting: Internal Medicine

## 2019-02-22 NOTE — Telephone Encounter (Signed)
Spoke with the pt  She had covid dx on 02/07/19 She states that she has been having HA over the past several days  She had some left over hydrocodone and took 1/2 tablet last night and this helped but the HA came back this morning  She states that she has been checking her BP and it usually is around 140/88  She has been checking her sats during the day and they are 95% on RA but they are 88% on RA when she first wakes up in the am She is asking if she needs to be concerned about HA and what to do about o2 sats being 88% in the mornings  Please advise thanks

## 2019-02-22 NOTE — Telephone Encounter (Signed)
She is >14 days from COVID 19 dx .  If she is still having headaches and elevated b/p will need to contact Primary Care for advise and onging care to manage.   As far as her COVID and PNA will need follow up in office next week with chest xray (that will put her at 21 days) with Dr. Earl Many to call patient to discuss symptoms but no answer.  If she is doing better from pulmonary standpoint that is fine if she is feeling worse will need video/televisit for evaluation  Sats sound okay , not unusual to be slightly low first thing in am with PNA , as get up typically get better because able to clear out clogged mucus but unable to evaluate as she did not answer.   Please contact office for sooner follow up if symptoms do not improve or worsen or seek emergency care

## 2019-02-22 NOTE — Telephone Encounter (Signed)
ATC and no answer. LMTCB

## 2019-02-23 NOTE — Telephone Encounter (Signed)
Pt returning call and can be reached @ [phone] ok to leave message she is currently @ work .[name]

## 2019-02-23 NOTE — Telephone Encounter (Signed)
LMTCB x3 for pt.  

## 2019-02-23 NOTE — Telephone Encounter (Signed)
Spoke with the pt and notified of recs per Tammy  She verbalized understanding  She states that she is going to have to call back to schedule the f/u with cxr for next wk  Nothing further needed at this time

## 2019-03-17 ENCOUNTER — Other Ambulatory Visit: Payer: Self-pay | Admitting: Internal Medicine

## 2019-03-25 DIAGNOSIS — L209 Atopic dermatitis, unspecified: Secondary | ICD-10-CM | POA: Diagnosis not present

## 2019-04-04 ENCOUNTER — Other Ambulatory Visit: Payer: Self-pay | Admitting: Internal Medicine

## 2019-04-11 ENCOUNTER — Other Ambulatory Visit: Payer: Self-pay

## 2019-04-11 MED ORDER — LOSARTAN POTASSIUM-HCTZ 100-25 MG PO TABS: 1.0000 | ORAL_TABLET | Freq: Every day | ORAL | 2 refills | Status: DC

## 2019-04-24 ENCOUNTER — Telehealth: Payer: Self-pay | Admitting: Pulmonary Disease

## 2019-04-24 NOTE — Telephone Encounter (Signed)
Patient called answering service Has a cough, bringing up clear to yellow phlegm for about a week  Denies fevers or chills, not feeling acutely ill She does check her saturations , its between 90-94  Concerned about getting a vaccine tomorrow for the Covid  I did tell her she is good to go to get a vaccine tomorrow as long as she is not feeling acutely ill  I encouraged her to call the office if any worsening symptoms

## 2019-04-25 ENCOUNTER — Ambulatory Visit: Payer: Self-pay | Attending: Internal Medicine

## 2019-04-25 DIAGNOSIS — Z23 Encounter for immunization: Secondary | ICD-10-CM

## 2019-04-25 NOTE — Progress Notes (Signed)
   Covid-19 Vaccination Clinic  Name:  [name]    MRN: [id number] DOB: 1960/10/09  04/25/2019  [name] was observed post Covid-19 immunization for 15 minutes without incident. She was provided with Vaccine Information Sheet and instruction to access the V-Safe system.   [name] was instructed to call 911 with any severe reactions post vaccine: Marland Kitchen Difficulty breathing  . Swelling of face and throat  . A fast heartbeat  . A bad rash all over body  . Dizziness and weakness   Immunizations Administered    Name Date Dose VIS Date Route   Pfizer COVID-19 Vaccine 04/25/2019  4:03 PM 0.3 mL 01/21/2019 Intramuscular   Manufacturer: [place]   Lot: [id number]   [place]: [id number]

## 2019-04-26 ENCOUNTER — Other Ambulatory Visit: Payer: Self-pay | Admitting: Internal Medicine

## 2019-04-26 ENCOUNTER — Other Ambulatory Visit: Payer: Self-pay

## 2019-04-26 ENCOUNTER — Ambulatory Visit (INDEPENDENT_AMBULATORY_CARE_PROVIDER_SITE_OTHER): Payer: BC Managed Care – PPO | Admitting: Primary Care

## 2019-04-26 ENCOUNTER — Encounter: Payer: Self-pay | Admitting: Primary Care

## 2019-04-26 DIAGNOSIS — J4541 Moderate persistent asthma with (acute) exacerbation: Secondary | ICD-10-CM

## 2019-04-26 MED ORDER — PREDNISONE 10 MG PO TABS: ORAL_TABLET | ORAL | 0 refills | Status: DC

## 2019-04-26 MED ORDER — HYDROCODONE-HOMATROPINE 5-1.5 MG/5ML PO SYRP: 5.0000 mL | ORAL_SOLUTION | Freq: Every evening | ORAL | 0 refills | Status: DC | PRN

## 2019-04-26 NOTE — Patient Instructions (Signed)
  Asthmatic bronchitis:  - RX prednisone taper to start tomorrow (40mg  x 2 days; 30mg  x 2 days; 20mg  x 2 days; 10mg  x 2 days) - Hycodan cough syrup 49ml at bedtime as needed for cough suppression - Recommend mucinex twice daily    Follow Up Instructions:  - Return/call if symptoms do not improve, worsen or develops fever

## 2019-04-26 NOTE — Progress Notes (Signed)
Virtual Visit via Telephone Note  I connected with [name] on 04/26/19 at  3:30 PM EDT by telephone and verified that I am speaking with the correct person using two identifiers.  Location: Patient: Home Provider: Office   I discussed the limitations, risks, security and privacy concerns of performing an evaluation and management service by telephone and the availability of in person appointments. I also discussed with the patient that there may be a patient responsible charge related to this service. The patient expressed understanding and agreed to proceed.   Brief patient profile:  61 yobf quit smoking in 1990 / [place]  remotely seen by Tia Masker on advair 250 one daily and freq albuterol and then R Plantar fascitis around march 2020 and then toe surgery both sides May 6th 2020 then more sob May 15th > admitted 07/05/2018   History of Present Illness: 59 year old female, former smoker. PMH significant asthma, allergic rhinitis, pneumonia d/t COVID-19, bilatgeral PE (May 2020). Patient of Dr. Melvyn Novas, last seen on 02/10/19. Maintained on Advair 115 hfa and Eliquis.   04/26/2019 Patient contacted today for acute televisit. Reports productive cough x 1 week with yellow mucus. Associated shortness of breath and wheezing with cough. Symptoms are worse in the morning. She is compliant with Advair twice daily. Using albuterol rescue inhaler 2-3 times a day over the last week. Received her first covid vaccine yesterday. Denies fever, chills, sweats, N/V/D.   Observations/Objective:  - Able to speak in full sentences; no obvious shortness of breath/wheezing or cough  Assessment and Plan:  Asthmatic bronchitis:  - Productive cough x 1 week with associated sob/wheezing - Continue Advair twice daily and prn albuterol q4-6 hours for breakthrough shortness of breath  - RX prednisone taper to start tomorrow (40mg  x 2 days; 30mg  x 2 days; 20mg  x 2 days; 10mg  x 2 days) - Recommend mucinex twice daily   - Hycodan cough syrup 6ml at bedtime as needed for cough suppression  - Holding off on abx, if patient develops fever or purulent mucus consider covid testing (possible variant) and/or CXR   Follow Up Instructions:  - Return/call if symptoms do not improve, worsen or develops fever - 6 month FU with Dr. Melvyn Novas due in June    I discussed the assessment and treatment plan with the patient. The patient was provided an opportunity to ask questions and all were answered. The patient agreed with the plan and demonstrated an understanding of the instructions.   The patient was advised to call back or seek an in-person evaluation if the symptoms worsen or if the condition fails to improve as anticipated.  I provided 22 minutes of non-face-to-face time during this encounter.   Martyn Ehrich, NP

## 2019-04-26 NOTE — Telephone Encounter (Signed)
Please refill as per office routine med refill policy (all routine meds refilled for 3 mo or monthly per pt preference up to one year from last visit, then month to month grace period for 3 mo, then further med refills will have to be denied)  

## 2019-05-17 ENCOUNTER — Ambulatory Visit: Payer: BC Managed Care – PPO

## 2019-05-18 ENCOUNTER — Ambulatory Visit: Payer: BC Managed Care – PPO | Attending: Internal Medicine

## 2019-05-18 DIAGNOSIS — Z23 Encounter for immunization: Secondary | ICD-10-CM

## 2019-05-18 NOTE — Progress Notes (Signed)
   Covid-19 Vaccination Clinic  Name:  [name]    MRN: [id number] DOB: November 22, 1960  05/18/2019  [name] was observed post Covid-19 immunization for 15 minutes without incident. She was provided with Vaccine Information Sheet and instruction to access the V-Safe system.   [name] was instructed to call 911 with any severe reactions post vaccine: Marland Kitchen Difficulty breathing  . Swelling of face and throat  . A fast heartbeat  . A bad rash all over body  . Dizziness and weakness   Immunizations Administered    Name Date Dose VIS Date Route   Pfizer COVID-19 Vaccine 05/18/2019  2:18 PM 0.3 mL 01/21/2019 Intramuscular   Manufacturer: [place]   Lot: [id number]   [place]: [id number]

## 2019-05-23 ENCOUNTER — Emergency Department (HOSPITAL_COMMUNITY): Payer: BC Managed Care – PPO

## 2019-05-23 ENCOUNTER — Emergency Department (HOSPITAL_COMMUNITY)
Admission: EM | Admit: 2019-05-23 | Discharge: 2019-05-23 | Disposition: A | Discharge status: Home / Self Care | Payer: BC Managed Care – PPO | Attending: Emergency Medicine | Admitting: Emergency Medicine

## 2019-05-23 ENCOUNTER — Encounter (HOSPITAL_COMMUNITY): Payer: Self-pay | Admitting: Emergency Medicine

## 2019-05-23 ENCOUNTER — Other Ambulatory Visit: Payer: Self-pay

## 2019-05-23 DIAGNOSIS — Z7901 Long term (current) use of anticoagulants: Secondary | ICD-10-CM | POA: Insufficient documentation

## 2019-05-23 DIAGNOSIS — Z87891 Personal history of nicotine dependence: Secondary | ICD-10-CM | POA: Diagnosis not present

## 2019-05-23 DIAGNOSIS — Z91040 Latex allergy status: Secondary | ICD-10-CM | POA: Insufficient documentation

## 2019-05-23 DIAGNOSIS — Z79899 Other long term (current) drug therapy: Secondary | ICD-10-CM | POA: Diagnosis not present

## 2019-05-23 DIAGNOSIS — R0602 Shortness of breath: Secondary | ICD-10-CM | POA: Diagnosis not present

## 2019-05-23 DIAGNOSIS — Z86711 Personal history of pulmonary embolism: Secondary | ICD-10-CM | POA: Insufficient documentation

## 2019-05-23 DIAGNOSIS — I10 Essential (primary) hypertension: Secondary | ICD-10-CM | POA: Diagnosis not present

## 2019-05-23 DIAGNOSIS — J4521 Mild intermittent asthma with (acute) exacerbation: Secondary | ICD-10-CM | POA: Insufficient documentation

## 2019-05-23 LAB — COMPREHENSIVE METABOLIC PANEL
ALT: 41 U/L (ref 0–44)
AST: 46 U/L — ABNORMAL HIGH (ref 15–41)
Albumin: 3 g/dL — ABNORMAL LOW (ref 3.5–5.0)
Alkaline Phosphatase: 100 U/L (ref 38–126)
Anion gap: 11 (ref 5–15)
BUN: 22 mg/dL — ABNORMAL HIGH (ref 6–20)
CO2: 26 mmol/L (ref 22–32)
Calcium: 8.9 mg/dL (ref 8.9–10.3)
Chloride: 108 mmol/L (ref 98–111)
Creatinine, Ser: 1.09 mg/dL — ABNORMAL HIGH (ref 0.44–1.00)
GFR calc Af Amer: >60 mL/min (ref >60)
GFR calc non Af Amer: 56 mL/min — ABNORMAL LOW (ref >60)
Glucose, Bld: 123 mg/dL — ABNORMAL HIGH (ref 70–99)
Potassium: 3.7 mmol/L (ref 3.5–5.1)
Sodium: 145 mmol/L (ref 135–145)
Total Bilirubin: 0.5 mg/dL (ref 0.3–1.2)
Total Protein: 7.1 g/dL (ref 6.5–8.1)

## 2019-05-23 LAB — CBC WITH DIFFERENTIAL/PLATELET
Abs Immature Granulocytes: 0.05 10*3/uL (ref 0.00–0.07)
Basophils Absolute: 0.1 10*3/uL (ref 0.0–0.1)
Basophils Relative: 1 %
Eosinophils Absolute: 0.2 10*3/uL (ref 0.0–0.5)
Eosinophils Relative: 3 %
HCT: 43.1 % (ref 36.0–46.0)
Hemoglobin: 13.7 g/dL (ref 12.0–15.0)
Immature Granulocytes: 1 %
Lymphocytes Relative: 24 %
Lymphs Abs: 2.2 10*3/uL (ref 0.7–4.0)
MCH: 29.9 pg (ref 26.0–34.0)
MCHC: 31.8 g/dL (ref 30.0–36.0)
MCV: 94.1 fL (ref 80.0–100.0)
Monocytes Absolute: 0.7 10*3/uL (ref 0.1–1.0)
Monocytes Relative: 7 %
Neutro Abs: 6.1 10*3/uL (ref 1.7–7.7)
Neutrophils Relative %: 64 %
Platelets: 291 10*3/uL (ref 150–400)
RBC: 4.58 MIL/uL (ref 3.87–5.11)
RDW: 15.2 % (ref 11.5–15.5)
WBC: 9.4 10*3/uL (ref 4.0–10.5)
nRBC: 0 % (ref 0.0–0.2)

## 2019-05-23 LAB — BRAIN NATRIURETIC PEPTIDE: B Natriuretic Peptide: 57.6 pg/mL (ref 0.0–100.0)

## 2019-05-23 MED ORDER — PREDNISONE 20 MG PO TABS: 40.0000 mg | ORAL_TABLET | Freq: Every day | ORAL | 0 refills | Status: AC

## 2019-05-23 MED ORDER — ALBUTEROL SULFATE HFA 108 (90 BASE) MCG/ACT IN AERS
2.0000 | INHALATION_SPRAY | Freq: Once | RESPIRATORY_TRACT | Status: AC
  Administered 2019-05-23: 2 via RESPIRATORY_TRACT
  Filled 2019-05-23: qty 6.7

## 2019-05-23 MED ORDER — ALBUTEROL SULFATE HFA 108 (90 BASE) MCG/ACT IN AERS
2.0000 | INHALATION_SPRAY | Freq: Once | RESPIRATORY_TRACT | Status: AC
  Administered 2019-05-23: 2 via RESPIRATORY_TRACT

## 2019-05-23 NOTE — ED Triage Notes (Signed)
Patient reports SOB this morning , denies chest pain , cough or fever ,

## 2019-05-23 NOTE — ED Notes (Signed)
Pt d/c home per MD order. Discharge summary reviewed, pt verbalizes understanding. Off unit via WC- no s/s of acute distress noted, pt discharged home with husband.

## 2019-05-23 NOTE — ED Provider Notes (Signed)
[facility] EMERGENCY DEPARTMENT Provider Note   CSN: [id number] Arrival date & time: 05/23/19  0543     History Chief Complaint  Patient presents with  . Shortness of Breath    [name] is a 59 y.o. female.  59 y.o female with a PMH of HTN, Asthma, Sepsis presents to the ED with a chief complaint of shortness of breath x couple of weeks. Patient reports she has noted an increase in her inhaler use, she states she has been using it 3-4 times a day.  She also endorses a cough with yellow sputum which usually occurs in the morning, reports she feels improvement after this occurs.  She is concerned about her shortness of breath as she has a previous history of pulmonary embolism, currently on Eliquis therapy.  States that shortness of breath has changed to use to only be with exertion now it occurs at rest.  She also currently at drinks a pint of alcohol daily.  She has not had any fevers, no chest pain, no prior history of heart failure, no abdominal pain or leg swelling.  The history is provided by the patient and medical records.  Shortness of Breath Associated symptoms: cough   Associated symptoms: no abdominal pain, no chest pain, no fever, no headaches, no sore throat, no vomiting and no wheezing        Past Medical History:  Diagnosis Date  . Alcohol abuse    abuse- moderate years ago - states only drinks now on weekends -2 to 8 driinks   . Asthma   . COLONIC POLYPS, HX OF 04/05/2010  . DIVERTICULITIS, HX OF 04/05/2010  . DJD (degenerative joint disease)    right knee, mot to severe  . GERD (gastroesophageal reflux disease)    no meds  . Heart murmur    hx of   . Hyperlipidemia   . Hypertension   . Impaired glucose tolerance 12/06/2013  . Obesity (BMI 30-39.9)   . PALPITATIONS, HX OF 09/14/2007    Patient Active Problem List   Diagnosis Date Noted  . Alcoholic intoxication without complication ([place]) 123XX123  . Pneumonia due to COVID-19  virus 02/10/2019  . Pulmonary embolism ([place]) 07/06/2018  . Elevated troponin 07/06/2018  . Leukocytosis 09/22/2017  . Abnormal urine color 09/22/2017  . Rash 09/22/2017  . Sepsis ([place]) 09/12/2016  . Colovesical fistula 09/12/2016  . Hypertension 07/28/2016  . Alcohol abuse 07/28/2016  . Obesity (BMI 30-39.9) 07/28/2016  . Abscess of bladder 07/28/2016  . Colonic diverticulum 07/28/2016  . Acute hypokalemia 07/28/2016  . Hyperlipidemia 07/10/2016  . Mass of left side of neck 07/10/2016  . Head and neck lymphadenopathy 07/10/2016  . Acute sinus infection 01/25/2016  . Eustachian tube disorder 01/25/2016  . Asthma exacerbation 05/18/2015  . Peripheral edema 03/16/2015  . Asthma 03/16/2015  . Right shoulder pain 03/16/2015  . Hypokalemia 10/04/2014  . Upper airway cough syndrome 10/03/2014  . Severe obesity (BMI >= 40) ([place]) 10/03/2014  . Lower back pain 05/25/2014  . Bilateral shoulder pain 05/25/2014  . Chest pain 04/14/2014  . Allergic rhinitis 12/06/2013  . Impaired glucose tolerance 12/06/2013  . Expected blood loss anemia 12/16/2012  . Morbid obesity ([place]) 12/15/2012  . S/P left TKA 12/14/2012  . Goiter 11/26/2012  . Preop exam for internal medicine 11/26/2012  . Diarrhea 02/25/2012  . Vaginal bleeding 04/30/2011  . Colon polyps 02/10/2011  . Eczema 02/10/2011  . Depression 02/10/2011  . Preventative health care 09/27/2010  .  Anxiety state 04/05/2010  . DIVERTICULITIS, HX OF 04/05/2010  . PALPITATIONS, HX OF 09/14/2007  . Obesity 04/24/2007  . VOCAL CORD DISORDER 04/24/2007  . Extrinsic asthma 04/24/2007  . GERD 04/24/2007    Past Surgical History:  Procedure Laterality Date  . ABDOMINAL HYSTERECTOMY  age 74   fibroids  . COLONOSCOPY WITH PROPOFOL N/A 07/25/2016   Procedure: COLONOSCOPY WITH PROPOFOL;  Surgeon: Carol Ada, MD;  Location: [facility];  Service: Endoscopy;  Laterality: N/A;  . colonscopy     x 2  . IR RADIOLOGIST EVAL & MGMT  08/12/2016  .  IR RADIOLOGIST EVAL & MGMT  08/21/2016  . KNEE ARTHROSCOPY     left   . TOTAL KNEE ARTHROPLASTY  07/29/2011   Procedure: TOTAL KNEE ARTHROPLASTY;  Surgeon: Mauri Pole, MD;  Location: [facility] ORS;  Service: Orthopedics;  Laterality: Right;  . TOTAL KNEE ARTHROPLASTY Left 12/14/2012   Procedure: LEFT TOTAL KNEE ARTHROPLASTY;  Surgeon: Mauri Pole, MD;  Location: [facility];  Service: Orthopedics;  Laterality: Left;     OB History    Gravida  1   Para  1   Term      Preterm      AB      Living  1     SAB      TAB      Ectopic      Multiple      Live Births              Family History  Problem Relation Age of Onset  . Stroke Mother   . COPD Father   . Lymphoma Sister     Social History   Tobacco Use  . Smoking status: Former Smoker    Packs/day: 0.50    Years: 7.00    Pack years: 3.50    Types: Cigarettes    Quit date: 02/11/1988    Years since quitting: 31.2  . Smokeless tobacco: Never Used  Substance Use Topics  . Alcohol use: Yes    Comment: Occas  . Drug use: No    Comment: smoked marajuania x 10 years.  Quit in 1985.    Home Medications Prior to Admission medications   Medication Sig Start Date End Date Taking? Authorizing Provider  ADVAIR DISKUS 250-50 MCG/DOSE AEPB Inhale 1 puff into the lungs 2 (two) times daily. 04/14/19  Yes [provider]  albuterol (PROAIR HFA) 108 (90 Base) MCG/ACT inhaler INHALE 1 TO 2 PUFFS BY MOUTH EVERY 6 HOURS AS NEEDED FOR WHEEZE 01/14/19  Yes Tanda Rockers, MD  alum & mag hydroxide-simeth (MAALOX/MYLANTA) 200-200-20 MG/5ML suspension Take 15 mLs by mouth every 6 (six) hours as needed for indigestion or heartburn.   Yes [provider]  apixaban (ELIQUIS) 5 MG TABS tablet Take 1 tablet (5 mg total) by mouth 2 (two) times daily. 01/14/19  Yes Tanda Rockers, MD  atorvastatin (LIPITOR) 20 MG tablet Take 1 tablet (20 mg total) by mouth daily. 01/14/19 01/14/20 Yes Biagio Borg, MD  cimetidine (TAGAMET) 200  MG tablet Take 200 mg by mouth as needed.   Yes [provider]  KLOR-CON M20 20 MEQ tablet TAKE 1 TABLET BY MOUTH EVERY DAY Patient taking differently: Take 10 mEq by mouth daily. 0.5 tablet daily 04/26/19  Yes Biagio Borg, MD  losartan-hydrochlorothiazide (HYZAAR) 100-25 MG tablet Take 1 tablet by mouth daily. 04/11/19  Yes Biagio Borg, MD  ondansetron (ZOFRAN-ODT) 4 MG disintegrating tablet  Take 1 tablet (4 mg total) by mouth every 8 (eight) hours as needed for nausea or vomiting. 02/10/19  Yes Tanda Rockers, MD  triamcinolone cream (KENALOG) 0.1 % APPLY TO AFFECTED AREA TWICE A DAY Patient taking differently: Apply 1 application topically 2 (two) times daily.  01/21/19  Yes Biagio Borg, MD  HYDROcodone-homatropine [facility]) 5-1.5 MG/5ML syrup Take 5 mLs by mouth at bedtime and may repeat dose one time if needed. Patient not taking: Reported on 05/23/2019 04/26/19   Martyn Ehrich, NP  predniSONE (DELTASONE) 20 MG tablet Take 2 tablets (40 mg total) by mouth daily for 5 days. 05/23/19 05/28/19  Janeece Fitting, PA-C    Allergies    Morphine, Shrimp [shellfish allergy], Tramadol, and Latex  Review of Systems   Review of Systems  Constitutional: Negative for fever.  HENT: Negative for sore throat.   Respiratory: Positive for cough and shortness of breath. Negative for chest tightness and wheezing.   Cardiovascular: Negative for chest pain, palpitations and leg swelling.  Gastrointestinal: Negative for abdominal pain, diarrhea, nausea and vomiting.  Genitourinary: Negative for flank pain.  Musculoskeletal: Negative for back pain.  Skin: Negative for pallor and wound.  Neurological: Negative for headaches.    Physical Exam Updated Vital Signs BP (!) 147/88   Pulse 97   Temp 98 F (36.7 C) (Oral)   Resp 18   Ht 5\' 2"  (1.575 m)   Wt 129.3 kg   SpO2 98%   BMI 52.13 kg/m   Physical Exam Vitals and nursing note reviewed.  Constitutional:      Appearance: She is  well-developed. She is obese. She is not ill-appearing or toxic-appearing.  HENT:     Head: Normocephalic and atraumatic.     Mouth/Throat:     Mouth: Mucous membranes are moist.  Cardiovascular:     Rate and Rhythm: Normal rate.     Pulses:          Dorsalis pedis pulses are 2+ on the right side and 2+ on the left side.       Posterior tibial pulses are 2+ on the right side and 2+ on the left side.     Comments: No BL pitting edema. No calf tenderness.  Pulmonary:     Effort: Pulmonary effort is normal. No tachypnea or accessory muscle usage.     Breath sounds: Normal breath sounds. No decreased breath sounds, wheezing, rhonchi or rales.  Chest:     Chest wall: No tenderness.  Abdominal:     Palpations: Abdomen is soft. There is no mass.  Musculoskeletal:     Cervical back: Normal range of motion.     Right lower leg: No edema.     Left lower leg: No edema.  Skin:    General: Skin is warm and dry.  Neurological:     Mental Status: She is alert and oriented to person, place, and time.     ED Results / Procedures / Treatments   Labs (all labs ordered are listed, but only abnormal results are displayed) Labs Reviewed  COMPREHENSIVE METABOLIC PANEL - Abnormal; Notable for the following components:      Result Value   Glucose, Bld 123 (*)    BUN 22 (*)    Creatinine, Ser 1.09 (*)    Albumin 3.0 (*)    AST 46 (*)    GFR calc non Af Amer 56 (*)    All other components within normal limits  CBC WITH DIFFERENTIAL/PLATELET  BRAIN NATRIURETIC PEPTIDE    EKG EKG Interpretation  Date/Time:  Monday May 23 2019 05:47:38 EDT Ventricular Rate:  88 PR Interval:  162 QRS Duration: 92 QT Interval:  382 QTC Calculation: 462 R Axis:   -3 Text Interpretation: Normal sinus rhythm with sinus arrhythmia no acute ST/T changes Confirmed by Sherwood Gambler [phone]) on 05/23/2019 6:55:23 AM   Radiology DG Chest 2 View  Result Date: 05/23/2019 CLINICAL DATA:  59 year old female with  history of shortness of breath. EXAM: CHEST - 2 VIEW COMPARISON:  Chest x-ray 02/07/2019. FINDINGS: Lung volumes are low. No consolidative airspace disease. No pleural effusions. No pneumothorax. Heart size is mildly enlarged. Pulmonary venous congestion, without frank pulmonary edema. Upper mediastinal contours are within normal limits. IMPRESSION: 1. Cardiomegaly with pulmonary venous congestion, with no frank pulmonary edema. Electronically Signed   By: Vinnie Langton M.D.   On: 05/23/2019 06:19    Procedures Procedures (including critical care time)  Medications Ordered in ED Medications  albuterol (VENTOLIN HFA) 108 (90 Base) MCG/ACT inhaler 2 puff (2 puffs Inhalation Given 05/23/19 0834)  albuterol (VENTOLIN HFA) 108 (90 Base) MCG/ACT inhaler 2 puff (2 puffs Inhalation Given 05/23/19 0850)    ED Course  I have reviewed the triage vital signs and the nursing notes.  Pertinent labs & imaging results that were available during my care of the patient were reviewed by me and considered in my medical decision making (see chart for details).    MDM Rules/Calculators/A&P   Patient with a past medical history of asthma, former smoker, bilateral PEs in May 2020 presents to the ED with complaints of shortness of breath.  Currently on maintenance inhaler of Advair, does take Eliquis compliance in therapy.  She does report using her inhaler 3-4 times daily, which is increased for her twice a day dose.  She does report shortness of breath usually occurs when she is moving around, however now notes shortness of breath happens at rest.  She is concerned as she previously had a pulmonary embolism.  Extensive review of patient records along with a pulmonology telehealth visit on March 16 of 2020, where patient was given a prednisone taper to help with reductive cough, they also recommended Mucinex, diagnosed with a possible bronchitis.  The symptoms are resolved.  Interpretation of her labs revealed a  CBC with no leukocytosis, no signs of anemia.  CMP without any electrolyte abnormality, creatinine level slightly elevated however this is improved since her last visit.  LFTs are remarkable for elevation of AST, she does report drinking a pint of alcohol daily.  During evaluation patient does not have any wheezing to bilateral lung fields, no increased work of breathing, is satting at 99% on room air.  No bilateral leg swelling, lower suspicion for CHF.  BNP was obtained which was negative.  She does have a prior history of pulmonary embolism, is currently on Eliquis, reports compliance with therapy.  X-ray of her chest revealed: 1. Cardiomegaly with pulmonary venous congestion, with no frank  pulmonary edema.    9:53 AM patient reports improvement in her symptoms after albuterol inhaler, does report she still feels a crampy cough.  I advised that seeing as she does not have a fever, chest x-ray is clear without any signs of consolidation will defer antibiotic treatment at this time.  However, I did recommend steroid burst to help with filler up.  Patient does report she has had this in the past, which did help with her symptoms.  We  discussed strict return precautions if symptoms fail to improve in the next couple days.  Vitals are within normal limits, no hypoxia.  Patient stable for discharge.    Portions of this note were generated with Lobbyist. Dictation errors may occur despite best attempts at proofreading.  Final Clinical Impression(s) / ED Diagnoses Final diagnoses:  SOB (shortness of breath)  Mild intermittent asthma with exacerbation    Rx / DC Orders ED Discharge Orders         Ordered    predniSONE (DELTASONE) 20 MG tablet  Daily     05/23/19 0952           Janeece Fitting, PA-C 05/23/19 [id number]    Sherwood Gambler, MD 05/25/19 [phone]

## 2019-05-23 NOTE — Discharge Instructions (Addendum)
I have prescribed a short course of steroids to help with your symptoms, please take 2 tablets daily for the next 5 days.  If you experience any worsening symptoms, shortness of breath does not improve please return to the emergency department.

## 2019-06-02 ENCOUNTER — Telehealth: Payer: Self-pay | Admitting: Internal Medicine

## 2019-06-02 NOTE — Telephone Encounter (Signed)
Patient is calling she has finished her Vit D rx she was taking for the last 8 weeks and now wants to know if she needs to continue if so please call in rx or does she go to otc medication?   [id number] - [place], [place] - [address] AT [address] Phone:  [phone]  Fax:  [phone]     Patient requesting to please LVM about what she needs to do.

## 2019-06-17 ENCOUNTER — Encounter: Payer: Self-pay | Admitting: Podiatry

## 2019-06-17 ENCOUNTER — Other Ambulatory Visit: Payer: Self-pay

## 2019-06-17 ENCOUNTER — Ambulatory Visit (INDEPENDENT_AMBULATORY_CARE_PROVIDER_SITE_OTHER): Payer: BC Managed Care – PPO | Admitting: Podiatry

## 2019-06-17 DIAGNOSIS — M79609 Pain in unspecified limb: Secondary | ICD-10-CM

## 2019-06-17 DIAGNOSIS — L600 Ingrowing nail: Secondary | ICD-10-CM

## 2019-06-17 DIAGNOSIS — M79676 Pain in unspecified toe(s): Secondary | ICD-10-CM | POA: Diagnosis not present

## 2019-06-17 DIAGNOSIS — B351 Tinea unguium: Secondary | ICD-10-CM

## 2019-06-20 NOTE — Progress Notes (Signed)
Subjective:   Patient ID: [name], female   DOB: 59 y.o.   MRN: [id number]   HPI Patient presents stating that she has trouble with her toenails and she cannot reach them herself with patient having poor health and significant obesity which makes it very difficult to reach her feet with history of Covid.  Patient has had no other changes in health history currently   ROS      Objective:  Physical Exam  Neurovascular status intact with patient found to have incurvation of her nail beds with thick subungual debris with moderate discomfort associated with them and inability to wear shoe gear without discomfort at times     Assessment:  Patient with relative poor health with mycotic nail infection 1-5 both feet that gets sore     Plan:  H&P reviewed condition and at this point debridement of all nailbeds 1-5 both feet accomplished with no iatrogenic bleeding and reappoint for routine care

## 2019-06-24 ENCOUNTER — Ambulatory Visit: Payer: BC Managed Care – PPO | Admitting: Internal Medicine

## 2019-06-28 ENCOUNTER — Other Ambulatory Visit: Payer: Self-pay | Admitting: Internal Medicine

## 2019-07-15 ENCOUNTER — Ambulatory Visit: Payer: BC Managed Care – PPO | Admitting: Internal Medicine

## 2019-07-21 ENCOUNTER — Ambulatory Visit (INDEPENDENT_AMBULATORY_CARE_PROVIDER_SITE_OTHER): Payer: BC Managed Care – PPO | Admitting: Internal Medicine

## 2019-07-21 ENCOUNTER — Encounter: Payer: Self-pay | Admitting: Internal Medicine

## 2019-07-21 ENCOUNTER — Other Ambulatory Visit: Payer: Self-pay

## 2019-07-21 VITALS — BP 132/94 | HR 92 | Temp 98.2°F | Ht 62.0 in | Wt 300.0 lb

## 2019-07-21 DIAGNOSIS — D72829 Elevated white blood cell count, unspecified: Secondary | ICD-10-CM

## 2019-07-21 DIAGNOSIS — R7302 Impaired glucose tolerance (oral): Secondary | ICD-10-CM | POA: Diagnosis not present

## 2019-07-21 DIAGNOSIS — I10 Essential (primary) hypertension: Secondary | ICD-10-CM

## 2019-07-21 DIAGNOSIS — E785 Hyperlipidemia, unspecified: Secondary | ICD-10-CM | POA: Diagnosis not present

## 2019-07-21 MED ORDER — METHOCARBAMOL 500 MG PO TABS: 500.0000 mg | ORAL_TABLET | Freq: Four times a day (QID) | ORAL | 3 refills | Status: DC

## 2019-07-21 MED ORDER — HYDROCODONE-HOMATROPINE 5-1.5 MG/5ML PO SYRP: 5.0000 mL | ORAL_SOLUTION | Freq: Four times a day (QID) | ORAL | 0 refills | Status: DC | PRN

## 2019-07-21 MED ORDER — DESOXIMETASONE 0.05 % EX CREA: TOPICAL_CREAM | Freq: Two times a day (BID) | CUTANEOUS | 1 refills | Status: DC

## 2019-07-21 NOTE — Patient Instructions (Addendum)
Please take all new medication as prescribed - the muscle relaxer, cough med, and steroid cream  Please continue all other medications as before, and refills have been done if requested.  Please have the pharmacy call with any other refills you may need.  Please continue your efforts at being more active, low cholesterol diet, and weight control.  Please keep your appointments with your specialists as you may have planned  Please make an Appointment to return in 6 months, or sooner if needed

## 2019-07-21 NOTE — Progress Notes (Signed)
Subjective:    Patient ID: [name], female    DOB: [birth year], 59 y.o.   MRN: [id number]  HPI  Here to f/u; overall doing ok,  Pt denies chest pain, increasing sob or doe, wheezing, orthopnea, PND, increased LE swelling, palpitations, dizziness or syncope.  Pt denies new neurological symptoms such as new headache, or facial or extremity weakness or numbness.  Pt denies polydipsia, polyuria, or low sugar episode.  Pt states overall good compliance with meds, mostly trying to follow appropriate diet, with wt overall stable,  but little exercise however. S/p covid infection nov 2020, no hospn, but now with skin lesions non tender bumpy rash to mid upper forehead,, some itchy for 1 wk.  Also c/o bilateral upper leg cramps mild intemrittent for several wks despite plenty of fluids.  Has an ongoing cough with increased freq for 2 wks, has some increased allergy symptoms.   Past Medical History:  Diagnosis Date  . Alcohol abuse    abuse- moderate years ago - states only drinks now on weekends -2 to 8 driinks   . Asthma   . COLONIC POLYPS, HX OF 04/05/2010  . DIVERTICULITIS, HX OF 04/05/2010  . DJD (degenerative joint disease)    right knee, mot to severe  . GERD (gastroesophageal reflux disease)    no meds  . Heart murmur    hx of   . Hyperlipidemia   . Hypertension   . Impaired glucose tolerance 12/06/2013  . Obesity (BMI 30-39.9)   . PALPITATIONS, HX OF 09/14/2007   Past Surgical History:  Procedure Laterality Date  . ABDOMINAL HYSTERECTOMY  age 87   fibroids  . COLONOSCOPY WITH PROPOFOL N/A 07/25/2016   Procedure: COLONOSCOPY WITH PROPOFOL;  Surgeon: Carol Ada, MD;  Location: [facility];  Service: Endoscopy;  Laterality: N/A;  . colonscopy     x 2  . IR RADIOLOGIST EVAL & MGMT  08/12/2016  . IR RADIOLOGIST EVAL & MGMT  08/21/2016  . KNEE ARTHROSCOPY     left   . TOTAL KNEE ARTHROPLASTY  07/29/2011   Procedure: TOTAL KNEE ARTHROPLASTY;  Surgeon: Mauri Pole, MD;  Location:  [facility];  Service: Orthopedics;  Laterality: Right;  . TOTAL KNEE ARTHROPLASTY Left 12/14/2012   Procedure: LEFT TOTAL KNEE ARTHROPLASTY;  Surgeon: Mauri Pole, MD;  Location: [facility];  Service: Orthopedics;  Laterality: Left;    reports that she quit smoking about 31 years ago. Her smoking use included cigarettes. She has a 3.50 pack-year smoking history. She has never used smokeless tobacco. She reports current alcohol use. She reports that she does not use drugs. family history includes COPD in her father; Lymphoma in her sister; Stroke in her mother. Allergies  Allergen Reactions  . Morphine Itching  . Shrimp [Shellfish Allergy] Itching and Other (See Comments)    Tongue burns also  . Tramadol Other (See Comments)    Caused confusion  . Latex Rash   Current Outpatient Medications on File Prior to Visit  Medication Sig Dispense Refill  . ADVAIR DISKUS 250-50 MCG/DOSE AEPB Inhale 1 puff into the lungs 2 (two) times daily.    Marland Kitchen albuterol (PROAIR HFA) 108 (90 Base) MCG/ACT inhaler INHALE 1 TO 2 PUFFS BY MOUTH EVERY 6 HOURS AS NEEDED FOR WHEEZE 8.5 g 5  . apixaban (ELIQUIS) 5 MG TABS tablet Take 1 tablet (5 mg total) by mouth 2 (two) times daily. 60 tablet 5  . atorvastatin (LIPITOR) 20 MG tablet Take 1 tablet (  20 mg total) by mouth daily. 90 tablet 3  . cimetidine (TAGAMET) 200 MG tablet Take 200 mg by mouth as needed.    Marland Kitchen KLOR-CON M20 20 MEQ tablet TAKE 1 TABLET BY MOUTH EVERY DAY (Patient taking differently: Take 10 mEq by mouth daily. 0.5 tablet daily) 90 tablet 2  . losartan-hydrochlorothiazide (HYZAAR) 100-25 MG tablet Take 1 tablet by mouth daily. 30 tablet 2   No current facility-administered medications on file prior to visit.   Review of Systems All otherwise neg per pt    Objective:   Physical Exam BP (!) 132/94 (BP Location: Left Arm, Patient Position: Sitting, Cuff Size: Large)   Pulse 92   Temp 98.2 F (36.8 C) (Oral)   Ht 5\' 2"  (1.575 m)   Wt 300 lb (136.1 kg)    SpO2 97%   BMI 54.87 kg/m  VS noted,  Constitutional: Pt appears in NAD HENT: Head: NCAT.  Right Ear: External ear normal.  Left Ear: External ear normal.  Eyes: . Pupils are equal, round, and reactive to light. Conjunctivae and EOM are normal Nose: without d/c or deformity Neck: Neck supple. Gross normal ROM Cardiovascular: Normal rate and regular rhythm.   Pulmonary/Chest: Effort normal and breath sounds without rales or wheezing.  Abd:  Soft, NT, ND, + BS, no organomegaly Neurological: Pt is alert. At baseline orientation, motor grossly intact Skin: Skin is warm. No rashes, other new lesions, no LE edema Psychiatric: Pt behavior is normal without agitation  All otherwise neg per pt Lab Results  Component Value Date   WBC 9.4 05/23/2019   HGB 13.7 05/23/2019   HCT 43.1 05/23/2019   PLT 291 05/23/2019   GLUCOSE 123 (H) 05/23/2019   CHOL 182 01/14/2019   TRIG 78.0 01/14/2019   HDL 57.60 01/14/2019   LDLDIRECT 107.0 02/23/2012   LDLCALC 109 (H) 01/14/2019   ALT 41 05/23/2019   AST 46 (H) 05/23/2019   NA 145 05/23/2019   K 3.7 05/23/2019   CL 108 05/23/2019   CREATININE 1.09 (H) 05/23/2019   BUN 22 (H) 05/23/2019   CO2 26 05/23/2019   TSH 1.57 01/14/2019   INR 1.11 07/28/2016   HGBA1C 6.0 01/14/2019       Assessment & Plan:

## 2019-07-22 LAB — CBC WITH DIFFERENTIAL/PLATELET
Basophils Absolute: 0.2 10*3/uL — ABNORMAL HIGH (ref 0.0–0.1)
Basophils Relative: 1.3 % (ref 0.0–3.0)
Eosinophils Absolute: 0.2 10*3/uL (ref 0.0–0.7)
Eosinophils Relative: 2 % (ref 0.0–5.0)
HCT: 42.7 % (ref 36.0–46.0)
Hemoglobin: 14 g/dL (ref 12.0–15.0)
Lymphocytes Relative: 15.4 % (ref 12.0–46.0)
Lymphs Abs: 1.8 10*3/uL (ref 0.7–4.0)
MCHC: 32.8 g/dL (ref 30.0–36.0)
MCV: 92.1 fl (ref 78.0–100.0)
Monocytes Absolute: 0.9 10*3/uL (ref 0.1–1.0)
Monocytes Relative: 7.9 % (ref 3.0–12.0)
Neutro Abs: 8.7 10*3/uL — ABNORMAL HIGH (ref 1.4–7.7)
Neutrophils Relative %: 73.4 % (ref 43.0–77.0)
Platelets: 244 10*3/uL (ref 150.0–400.0)
RBC: 4.64 Mil/uL (ref 3.87–5.11)
RDW: 15.3 % (ref 11.5–15.5)
WBC: 11.9 10*3/uL — ABNORMAL HIGH (ref 4.0–10.5)

## 2019-07-22 LAB — HEPATIC FUNCTION PANEL
ALT: 30 U/L (ref 0–35)
AST: 30 U/L (ref 0–37)
Albumin: 3.8 g/dL (ref 3.5–5.2)
Alkaline Phosphatase: 99 U/L (ref 39–117)
Bilirubin, Direct: 0.1 mg/dL (ref 0.0–0.3)
Total Bilirubin: 0.4 mg/dL (ref 0.2–1.2)
Total Protein: 7.4 g/dL (ref 6.0–8.3)

## 2019-07-22 LAB — URINALYSIS, ROUTINE W REFLEX MICROSCOPIC
Bilirubin Urine: NEGATIVE
Ketones, ur: NEGATIVE
Leukocytes,Ua: NEGATIVE
Nitrite: NEGATIVE
Specific Gravity, Urine: >=1.03 — AB (ref 1.000–1.030)
Total Protein, Urine: NEGATIVE
Urine Glucose: NEGATIVE
Urobilinogen, UA: 0.2 (ref 0.0–1.0)
WBC, UA: NONE SEEN (ref 0–?)
pH: 5 (ref 5.0–8.0)

## 2019-07-22 LAB — LIPID PANEL
Cholesterol: 151 mg/dL (ref 0–200)
HDL: 56.1 mg/dL (ref >39.00)
LDL Cholesterol: 80 mg/dL (ref 0–99)
NonHDL: 94.79
Total CHOL/HDL Ratio: 3
Triglycerides: 76 mg/dL (ref 0.0–149.0)
VLDL: 15.2 mg/dL (ref 0.0–40.0)

## 2019-07-22 LAB — BASIC METABOLIC PANEL
BUN: 21 mg/dL (ref 6–23)
CO2: 30 mEq/L (ref 19–32)
Calcium: 9.2 mg/dL (ref 8.4–10.5)
Chloride: 105 mEq/L (ref 96–112)
Creatinine, Ser: 0.98 mg/dL (ref 0.40–1.20)
GFR: 70.45 mL/min (ref >60.00)
Glucose, Bld: 92 mg/dL (ref 70–99)
Potassium: 3.8 mEq/L (ref 3.5–5.1)
Sodium: 139 mEq/L (ref 135–145)

## 2019-07-22 LAB — TSH: TSH: 2.25 u[IU]/mL (ref 0.35–4.50)

## 2019-07-23 ENCOUNTER — Other Ambulatory Visit: Payer: Self-pay | Admitting: Internal Medicine

## 2019-07-23 NOTE — Telephone Encounter (Signed)
Please refill as per office routine med refill policy (all routine meds refilled for 3 mo or monthly per pt preference up to one year from last visit, then month to month grace period for 3 mo, then further med refills will have to be denied)  

## 2019-07-24 ENCOUNTER — Encounter: Payer: Self-pay | Admitting: Internal Medicine

## 2019-07-24 NOTE — Assessment & Plan Note (Signed)
stable overall by history and exam, recent data reviewed with pt, and pt to continue medical treatment as before,  to f/u any worsening symptoms or concerns  

## 2019-07-24 NOTE — Assessment & Plan Note (Signed)
For f/u wbc, ok for cough med asd prn, decliens cxr

## 2019-07-24 NOTE — Assessment & Plan Note (Addendum)
stable overall by history and exam, recent data reviewed with pt, and pt to continue medical treatment as before,  to f/u any worsening symptoms or concerns  I spent 31 minutes in preparing to see the patient by review of recent labs, imaging and procedures, obtaining and reviewing separately obtained history, communicating with the patient and family or caregiver, ordering medications, tests or procedures, and documenting clinical information in the EHR including the differential Dx, treatment, and any further evaluation and other management of hyerglycemia, htn, hld, cough, leukocytosis

## 2019-07-25 ENCOUNTER — Telehealth: Payer: Self-pay

## 2019-07-25 ENCOUNTER — Encounter: Payer: Self-pay | Admitting: Internal Medicine

## 2019-07-25 LAB — HEMOGLOBIN A1C: Hgb A1c MFr Bld: 6.4 % (ref 4.6–6.5)

## 2019-07-25 NOTE — Telephone Encounter (Signed)
New message    The patient calling for test results.   

## 2019-07-25 NOTE — Telephone Encounter (Signed)
New message:   Pt is calling and states she would like a call back about her lab results. Pt states she has sent mychart messages to the provider. Please advise.

## 2019-07-26 ENCOUNTER — Telehealth: Payer: Self-pay | Admitting: Podiatry

## 2019-07-26 NOTE — Telephone Encounter (Signed)
Pt called to see what she could do to help had nails remove may and now has hard spots on great toes would like advice to what to do until appt

## 2019-07-27 NOTE — Telephone Encounter (Signed)
PCP responded to pt via MyChart stating that all lab work was normal.

## 2019-07-28 NOTE — Telephone Encounter (Signed)
Pt states that she is still having hard spots form on nail beds and would like to know what she can do to help with the discomfort till appt 08/12/19

## 2019-08-12 ENCOUNTER — Ambulatory Visit: Payer: BC Managed Care – PPO | Admitting: Podiatry

## 2019-08-16 ENCOUNTER — Encounter: Payer: Self-pay | Admitting: Internal Medicine

## 2019-08-16 MED ORDER — PHENTERMINE HCL 37.5 MG PO CAPS: 37.5000 mg | ORAL_CAPSULE | ORAL | 2 refills | Status: DC

## 2019-08-16 NOTE — Telephone Encounter (Signed)
Done erx 

## 2019-08-17 DIAGNOSIS — L700 Acne vulgaris: Secondary | ICD-10-CM | POA: Diagnosis not present

## 2019-08-17 DIAGNOSIS — L818 Other specified disorders of pigmentation: Secondary | ICD-10-CM | POA: Diagnosis not present

## 2019-08-23 MED ORDER — ONDANSETRON HCL 4 MG PO TABS: 4.0000 mg | ORAL_TABLET | Freq: Three times a day (TID) | ORAL | 0 refills | Status: DC | PRN

## 2019-08-23 NOTE — Telephone Encounter (Signed)
I dont think we started Vit D, patient should contact PCP regarding this issue

## 2019-08-25 ENCOUNTER — Other Ambulatory Visit: Payer: Self-pay

## 2019-08-25 ENCOUNTER — Encounter: Payer: Self-pay | Admitting: Podiatry

## 2019-08-25 ENCOUNTER — Ambulatory Visit (INDEPENDENT_AMBULATORY_CARE_PROVIDER_SITE_OTHER): Payer: BC Managed Care – PPO | Admitting: Podiatry

## 2019-08-25 DIAGNOSIS — L600 Ingrowing nail: Secondary | ICD-10-CM

## 2019-08-31 ENCOUNTER — Encounter: Payer: Self-pay | Admitting: Internal Medicine

## 2019-08-31 MED ORDER — HYDROCODONE-HOMATROPINE 5-1.5 MG/5ML PO SYRP: 5.0000 mL | ORAL_SOLUTION | Freq: Four times a day (QID) | ORAL | 0 refills | Status: DC | PRN

## 2019-09-23 ENCOUNTER — Encounter (HOSPITAL_COMMUNITY): Payer: Self-pay

## 2019-09-23 ENCOUNTER — Inpatient Hospital Stay (HOSPITAL_COMMUNITY)
Admission: EM | Admit: 2019-09-23 | Discharge: 2019-09-29 | DRG: 897 | Disposition: A | Discharge status: Home Health | Payer: BC Managed Care – PPO | Attending: Internal Medicine | Admitting: Internal Medicine

## 2019-09-23 ENCOUNTER — Emergency Department (HOSPITAL_COMMUNITY): Payer: BC Managed Care – PPO

## 2019-09-23 DIAGNOSIS — I517 Cardiomegaly: Secondary | ICD-10-CM | POA: Diagnosis not present

## 2019-09-23 DIAGNOSIS — R778 Other specified abnormalities of plasma proteins: Secondary | ICD-10-CM | POA: Diagnosis present

## 2019-09-23 DIAGNOSIS — I2699 Other pulmonary embolism without acute cor pulmonale: Secondary | ICD-10-CM | POA: Diagnosis present

## 2019-09-23 DIAGNOSIS — Z91013 Allergy to seafood: Secondary | ICD-10-CM

## 2019-09-23 DIAGNOSIS — R945 Abnormal results of liver function studies: Secondary | ICD-10-CM | POA: Diagnosis not present

## 2019-09-23 DIAGNOSIS — F10939 Alcohol use, unspecified with withdrawal, unspecified: Secondary | ICD-10-CM | POA: Diagnosis present

## 2019-09-23 DIAGNOSIS — Z6841 Body Mass Index (BMI) 40.0 and over, adult: Secondary | ICD-10-CM | POA: Diagnosis present

## 2019-09-23 DIAGNOSIS — Z7951 Long term (current) use of inhaled steroids: Secondary | ICD-10-CM

## 2019-09-23 DIAGNOSIS — E162 Hypoglycemia, unspecified: Secondary | ICD-10-CM | POA: Diagnosis present

## 2019-09-23 DIAGNOSIS — K219 Gastro-esophageal reflux disease without esophagitis: Secondary | ICD-10-CM | POA: Diagnosis present

## 2019-09-23 DIAGNOSIS — R0789 Other chest pain: Secondary | ICD-10-CM | POA: Diagnosis present

## 2019-09-23 DIAGNOSIS — R443 Hallucinations, unspecified: Secondary | ICD-10-CM | POA: Diagnosis present

## 2019-09-23 DIAGNOSIS — Z811 Family history of alcohol abuse and dependence: Secondary | ICD-10-CM

## 2019-09-23 DIAGNOSIS — K760 Fatty (change of) liver, not elsewhere classified: Secondary | ICD-10-CM | POA: Diagnosis present

## 2019-09-23 DIAGNOSIS — J90 Pleural effusion, not elsewhere classified: Secondary | ICD-10-CM | POA: Diagnosis not present

## 2019-09-23 DIAGNOSIS — F10239 Alcohol dependence with withdrawal, unspecified: Secondary | ICD-10-CM | POA: Diagnosis not present

## 2019-09-23 DIAGNOSIS — Z90710 Acquired absence of both cervix and uterus: Secondary | ICD-10-CM

## 2019-09-23 DIAGNOSIS — Z96653 Presence of artificial knee joint, bilateral: Secondary | ICD-10-CM | POA: Diagnosis present

## 2019-09-23 DIAGNOSIS — R0602 Shortness of breath: Secondary | ICD-10-CM | POA: Diagnosis not present

## 2019-09-23 DIAGNOSIS — E872 Acidosis: Secondary | ICD-10-CM | POA: Diagnosis present

## 2019-09-23 DIAGNOSIS — Z7901 Long term (current) use of anticoagulants: Secondary | ICD-10-CM

## 2019-09-23 DIAGNOSIS — Z20822 Contact with and (suspected) exposure to covid-19: Secondary | ICD-10-CM | POA: Diagnosis present

## 2019-09-23 DIAGNOSIS — Z86711 Personal history of pulmonary embolism: Secondary | ICD-10-CM

## 2019-09-23 DIAGNOSIS — G4089 Other seizures: Secondary | ICD-10-CM | POA: Diagnosis present

## 2019-09-23 DIAGNOSIS — F1010 Alcohol abuse, uncomplicated: Secondary | ICD-10-CM | POA: Diagnosis present

## 2019-09-23 DIAGNOSIS — R7989 Other specified abnormal findings of blood chemistry: Secondary | ICD-10-CM | POA: Diagnosis present

## 2019-09-23 DIAGNOSIS — R079 Chest pain, unspecified: Secondary | ICD-10-CM | POA: Diagnosis not present

## 2019-09-23 DIAGNOSIS — D751 Secondary polycythemia: Secondary | ICD-10-CM | POA: Diagnosis present

## 2019-09-23 DIAGNOSIS — Z87891 Personal history of nicotine dependence: Secondary | ICD-10-CM

## 2019-09-23 DIAGNOSIS — Z79899 Other long term (current) drug therapy: Secondary | ICD-10-CM

## 2019-09-23 DIAGNOSIS — I10 Essential (primary) hypertension: Secondary | ICD-10-CM | POA: Diagnosis present

## 2019-09-23 DIAGNOSIS — J45909 Unspecified asthma, uncomplicated: Secondary | ICD-10-CM | POA: Diagnosis present

## 2019-09-23 DIAGNOSIS — E785 Hyperlipidemia, unspecified: Secondary | ICD-10-CM | POA: Diagnosis present

## 2019-09-23 LAB — COMPREHENSIVE METABOLIC PANEL
ALT: 50 U/L — ABNORMAL HIGH (ref 0–44)
AST: 70 U/L — ABNORMAL HIGH (ref 15–41)
Albumin: 3.6 g/dL (ref 3.5–5.0)
Alkaline Phosphatase: 124 U/L (ref 38–126)
Anion gap: 21 — ABNORMAL HIGH (ref 5–15)
BUN: 20 mg/dL (ref 6–20)
CO2: 20 mmol/L — ABNORMAL LOW (ref 22–32)
Calcium: 8.8 mg/dL — ABNORMAL LOW (ref 8.9–10.3)
Chloride: 97 mmol/L — ABNORMAL LOW (ref 98–111)
Creatinine, Ser: 1.08 mg/dL — ABNORMAL HIGH (ref 0.44–1.00)
GFR calc Af Amer: >60 mL/min (ref >60)
GFR calc non Af Amer: 57 mL/min — ABNORMAL LOW (ref >60)
Glucose, Bld: 68 mg/dL — ABNORMAL LOW (ref 70–99)
Potassium: 4.4 mmol/L (ref 3.5–5.1)
Sodium: 138 mmol/L (ref 135–145)
Total Bilirubin: 0.9 mg/dL (ref 0.3–1.2)
Total Protein: 8.1 g/dL (ref 6.5–8.1)

## 2019-09-23 LAB — TROPONIN I (HIGH SENSITIVITY): Troponin I (High Sensitivity): 28 ng/L — ABNORMAL HIGH (ref <18)

## 2019-09-23 LAB — CBG MONITORING, ED: Glucose-Capillary: 69 mg/dL — ABNORMAL LOW (ref 70–99)

## 2019-09-23 LAB — CBC
HCT: 48.9 % — ABNORMAL HIGH (ref 36.0–46.0)
Hemoglobin: 15.6 g/dL — ABNORMAL HIGH (ref 12.0–15.0)
MCH: 28.8 pg (ref 26.0–34.0)
MCHC: 31.9 g/dL (ref 30.0–36.0)
MCV: 90.2 fL (ref 80.0–100.0)
Platelets: 293 10*3/uL (ref 150–400)
RBC: 5.42 MIL/uL — ABNORMAL HIGH (ref 3.87–5.11)
RDW: 13.7 % (ref 11.5–15.5)
WBC: 9.7 10*3/uL (ref 4.0–10.5)
nRBC: 0.2 % (ref 0.0–0.2)

## 2019-09-23 LAB — I-STAT BETA HCG BLOOD, ED (MC, WL, AP ONLY)
I-stat hCG, quantitative: <5 m[IU]/mL (ref <5)
I-stat hCG, quantitative: <5 m[IU]/mL (ref <5)

## 2019-09-23 LAB — ETHANOL: Alcohol, Ethyl (B): 281 mg/dL — ABNORMAL HIGH (ref <10)

## 2019-09-23 NOTE — ED Triage Notes (Signed)
Pt arrives POV for eval of CP and dizziness onset this AM. Pt reports she has had decreased PO intake d/t alcoholism. States last drink 1 hour PTA, pt reports she drinks fifth of vodka daily and voices desire to stop.

## 2019-09-24 ENCOUNTER — Other Ambulatory Visit: Payer: Self-pay

## 2019-09-24 DIAGNOSIS — F10230 Alcohol dependence with withdrawal, uncomplicated: Secondary | ICD-10-CM | POA: Diagnosis not present

## 2019-09-24 DIAGNOSIS — F10939 Alcohol use, unspecified with withdrawal, unspecified: Secondary | ICD-10-CM | POA: Diagnosis present

## 2019-09-24 DIAGNOSIS — F1010 Alcohol abuse, uncomplicated: Secondary | ICD-10-CM | POA: Diagnosis not present

## 2019-09-24 DIAGNOSIS — G4089 Other seizures: Secondary | ICD-10-CM | POA: Diagnosis present

## 2019-09-24 DIAGNOSIS — Z811 Family history of alcohol abuse and dependence: Secondary | ICD-10-CM | POA: Diagnosis not present

## 2019-09-24 DIAGNOSIS — Z7951 Long term (current) use of inhaled steroids: Secondary | ICD-10-CM | POA: Diagnosis not present

## 2019-09-24 DIAGNOSIS — Z86711 Personal history of pulmonary embolism: Secondary | ICD-10-CM | POA: Diagnosis not present

## 2019-09-24 DIAGNOSIS — D751 Secondary polycythemia: Secondary | ICD-10-CM | POA: Diagnosis present

## 2019-09-24 DIAGNOSIS — K760 Fatty (change of) liver, not elsewhere classified: Secondary | ICD-10-CM | POA: Diagnosis not present

## 2019-09-24 DIAGNOSIS — Z7901 Long term (current) use of anticoagulants: Secondary | ICD-10-CM | POA: Diagnosis not present

## 2019-09-24 DIAGNOSIS — Z6841 Body Mass Index (BMI) 40.0 and over, adult: Secondary | ICD-10-CM | POA: Diagnosis not present

## 2019-09-24 DIAGNOSIS — I2782 Chronic pulmonary embolism: Secondary | ICD-10-CM | POA: Diagnosis not present

## 2019-09-24 DIAGNOSIS — J45909 Unspecified asthma, uncomplicated: Secondary | ICD-10-CM | POA: Diagnosis present

## 2019-09-24 DIAGNOSIS — F10239 Alcohol dependence with withdrawal, unspecified: Secondary | ICD-10-CM | POA: Diagnosis present

## 2019-09-24 DIAGNOSIS — I10 Essential (primary) hypertension: Secondary | ICD-10-CM | POA: Diagnosis present

## 2019-09-24 DIAGNOSIS — R443 Hallucinations, unspecified: Secondary | ICD-10-CM | POA: Diagnosis present

## 2019-09-24 DIAGNOSIS — E162 Hypoglycemia, unspecified: Secondary | ICD-10-CM | POA: Diagnosis present

## 2019-09-24 DIAGNOSIS — R0789 Other chest pain: Secondary | ICD-10-CM | POA: Diagnosis present

## 2019-09-24 DIAGNOSIS — E785 Hyperlipidemia, unspecified: Secondary | ICD-10-CM | POA: Diagnosis present

## 2019-09-24 DIAGNOSIS — E872 Acidosis: Secondary | ICD-10-CM | POA: Diagnosis present

## 2019-09-24 DIAGNOSIS — R778 Other specified abnormalities of plasma proteins: Secondary | ICD-10-CM | POA: Diagnosis not present

## 2019-09-24 DIAGNOSIS — Z90710 Acquired absence of both cervix and uterus: Secondary | ICD-10-CM | POA: Diagnosis not present

## 2019-09-24 DIAGNOSIS — Z87891 Personal history of nicotine dependence: Secondary | ICD-10-CM | POA: Diagnosis not present

## 2019-09-24 DIAGNOSIS — K219 Gastro-esophageal reflux disease without esophagitis: Secondary | ICD-10-CM | POA: Diagnosis present

## 2019-09-24 DIAGNOSIS — Z79899 Other long term (current) drug therapy: Secondary | ICD-10-CM | POA: Diagnosis not present

## 2019-09-24 DIAGNOSIS — Z96653 Presence of artificial knee joint, bilateral: Secondary | ICD-10-CM | POA: Diagnosis present

## 2019-09-24 DIAGNOSIS — Z20822 Contact with and (suspected) exposure to covid-19: Secondary | ICD-10-CM | POA: Diagnosis present

## 2019-09-24 LAB — MAGNESIUM: Magnesium: 2 mg/dL (ref 1.7–2.4)

## 2019-09-24 LAB — URINALYSIS, ROUTINE W REFLEX MICROSCOPIC
Bilirubin Urine: NEGATIVE
Glucose, UA: NEGATIVE mg/dL
Ketones, ur: 80 mg/dL — AB
Nitrite: NEGATIVE
Protein, ur: 100 mg/dL — AB
Specific Gravity, Urine: 1.034 — ABNORMAL HIGH (ref 1.005–1.030)
pH: 5 (ref 5.0–8.0)

## 2019-09-24 LAB — RAPID URINE DRUG SCREEN, HOSP PERFORMED
Amphetamines: NOT DETECTED
Barbiturates: NOT DETECTED
Benzodiazepines: NOT DETECTED
Cocaine: NOT DETECTED
Opiates: NOT DETECTED
Tetrahydrocannabinol: NOT DETECTED

## 2019-09-24 LAB — CBG MONITORING, ED
Glucose-Capillary: 83 mg/dL (ref 70–99)
Glucose-Capillary: 110 mg/dL — ABNORMAL HIGH (ref 70–99)

## 2019-09-24 LAB — LACTIC ACID, PLASMA
Lactic Acid, Venous: 2.6 mmol/L (ref 0.5–1.9)
Lactic Acid, Venous: 1.8 mmol/L (ref 0.5–1.9)

## 2019-09-24 LAB — PHOSPHORUS: Phosphorus: 1.9 mg/dL — ABNORMAL LOW (ref 2.5–4.6)

## 2019-09-24 LAB — HIV ANTIBODY (ROUTINE TESTING W REFLEX): HIV Screen 4th Generation wRfx: NONREACTIVE

## 2019-09-24 LAB — SARS CORONAVIRUS 2 BY RT PCR (HOSPITAL ORDER, PERFORMED IN ~~LOC~~ HOSPITAL LAB): SARS Coronavirus 2: NEGATIVE

## 2019-09-24 LAB — TROPONIN I (HIGH SENSITIVITY)
Troponin I (High Sensitivity): 32 ng/L — ABNORMAL HIGH (ref <18)
Troponin I (High Sensitivity): 41 ng/L — ABNORMAL HIGH (ref <18)
Troponin I (High Sensitivity): 42 ng/L — ABNORMAL HIGH (ref <18)

## 2019-09-24 LAB — TSH: TSH: 3.824 u[IU]/mL (ref 0.350–4.500)

## 2019-09-24 MED ORDER — THIAMINE HCL 100 MG/ML IJ SOLN: 100.0000 mg | Freq: Every day | INTRAMUSCULAR | Status: DC

## 2019-09-24 MED ORDER — LACTATED RINGERS IV BOLUS
1000.0000 mL | Freq: Once | INTRAVENOUS | Status: AC
  Administered 2019-09-24: 1000 mL via INTRAVENOUS

## 2019-09-24 MED ORDER — ASPIRIN EC 81 MG PO TBEC
81.0000 mg | DELAYED_RELEASE_TABLET | Freq: Every day | ORAL | Status: DC
  Administered 2019-09-24 – 2019-09-29 (×6): 81 mg via ORAL
  Filled 2019-09-24 (×6): qty 1

## 2019-09-24 MED ORDER — LORAZEPAM 2 MG/ML IJ SOLN
1.0000 mg | INTRAMUSCULAR | Status: AC | PRN
  Administered 2019-09-24: 2 mg via INTRAVENOUS
  Administered 2019-09-25: 1 mg via INTRAVENOUS
  Filled 2019-09-24 (×2): qty 1

## 2019-09-24 MED ORDER — LORAZEPAM 2 MG/ML IJ SOLN
1.0000 mg | Freq: Once | INTRAMUSCULAR | Status: AC
  Administered 2019-09-24: 1 mg via INTRAVENOUS
  Filled 2019-09-24: qty 1

## 2019-09-24 MED ORDER — LORAZEPAM 1 MG PO TABS
0.0000 mg | ORAL_TABLET | Freq: Four times a day (QID) | ORAL | Status: DC
  Administered 2019-09-24: 1 mg via ORAL
  Filled 2019-09-24: qty 1

## 2019-09-24 MED ORDER — ONDANSETRON HCL 4 MG/2ML IJ SOLN: 4.0000 mg | Freq: Four times a day (QID) | INTRAMUSCULAR | Status: DC | PRN

## 2019-09-24 MED ORDER — METHOCARBAMOL 500 MG PO TABS
500.0000 mg | ORAL_TABLET | Freq: Four times a day (QID) | ORAL | Status: DC
  Administered 2019-09-24 – 2019-09-29 (×20): 500 mg via ORAL
  Filled 2019-09-24 (×20): qty 1

## 2019-09-24 MED ORDER — BENZONATATE 100 MG PO CAPS
100.0000 mg | ORAL_CAPSULE | Freq: Three times a day (TID) | ORAL | Status: DC | PRN
  Administered 2019-09-25 – 2019-09-27 (×2): 100 mg via ORAL
  Filled 2019-09-24 (×2): qty 1

## 2019-09-24 MED ORDER — ADULT MULTIVITAMIN W/MINERALS CH
1.0000 | ORAL_TABLET | Freq: Every day | ORAL | Status: DC
  Administered 2019-09-24 – 2019-09-29 (×6): 1 via ORAL
  Filled 2019-09-24 (×6): qty 1

## 2019-09-24 MED ORDER — LORAZEPAM 1 MG PO TABS: 0.0000 mg | ORAL_TABLET | Freq: Two times a day (BID) | ORAL | Status: DC

## 2019-09-24 MED ORDER — CLONIDINE HCL 0.1 MG PO TABS
0.1000 mg | ORAL_TABLET | Freq: Two times a day (BID) | ORAL | Status: DC
  Administered 2019-09-24 – 2019-09-29 (×10): 0.1 mg via ORAL
  Filled 2019-09-24 (×10): qty 1

## 2019-09-24 MED ORDER — SODIUM CHLORIDE 0.9 % IV BOLUS
1000.0000 mL | Freq: Once | INTRAVENOUS | Status: AC
  Administered 2019-09-24: 1000 mL via INTRAVENOUS

## 2019-09-24 MED ORDER — APIXABAN 5 MG PO TABS
5.0000 mg | ORAL_TABLET | Freq: Two times a day (BID) | ORAL | Status: DC
  Administered 2019-09-24 – 2019-09-29 (×11): 5 mg via ORAL
  Filled 2019-09-24 (×12): qty 1

## 2019-09-24 MED ORDER — ONDANSETRON HCL 4 MG PO TABS
4.0000 mg | ORAL_TABLET | Freq: Four times a day (QID) | ORAL | Status: DC | PRN
  Administered 2019-09-24: 4 mg via ORAL
  Filled 2019-09-24: qty 1

## 2019-09-24 MED ORDER — ONDANSETRON HCL 4 MG/2ML IJ SOLN
4.0000 mg | Freq: Once | INTRAMUSCULAR | Status: AC
  Administered 2019-09-24: 4 mg via INTRAVENOUS
  Filled 2019-09-24: qty 2

## 2019-09-24 MED ORDER — MOMETASONE FURO-FORMOTEROL FUM 200-5 MCG/ACT IN AERO
2.0000 | INHALATION_SPRAY | Freq: Two times a day (BID) | RESPIRATORY_TRACT | Status: DC
  Administered 2019-09-24 – 2019-09-29 (×9): 2 via RESPIRATORY_TRACT
  Filled 2019-09-24 (×2): qty 8.8

## 2019-09-24 MED ORDER — LORAZEPAM 2 MG/ML IJ SOLN
0.0000 mg | Freq: Four times a day (QID) | INTRAMUSCULAR | Status: DC
  Administered 2019-09-24: 1 mg via INTRAVENOUS
  Filled 2019-09-24: qty 1

## 2019-09-24 MED ORDER — FAMOTIDINE 20 MG PO TABS
20.0000 mg | ORAL_TABLET | Freq: Every day | ORAL | Status: DC
  Administered 2019-09-24 – 2019-09-29 (×6): 20 mg via ORAL
  Filled 2019-09-24 (×6): qty 1

## 2019-09-24 MED ORDER — LORAZEPAM 2 MG/ML IJ SOLN: 0.0000 mg | Freq: Two times a day (BID) | INTRAMUSCULAR | Status: DC

## 2019-09-24 MED ORDER — ATORVASTATIN CALCIUM 10 MG PO TABS
20.0000 mg | ORAL_TABLET | Freq: Every day | ORAL | Status: DC
  Administered 2019-09-24 – 2019-09-25 (×2): 20 mg via ORAL
  Filled 2019-09-24 (×2): qty 2

## 2019-09-24 MED ORDER — THIAMINE HCL 100 MG PO TABS
100.0000 mg | ORAL_TABLET | Freq: Every day | ORAL | Status: DC
  Administered 2019-09-24 – 2019-09-29 (×6): 100 mg via ORAL
  Filled 2019-09-24 (×6): qty 1

## 2019-09-24 MED ORDER — THIAMINE HCL 100 MG/ML IJ SOLN
Freq: Once | INTRAVENOUS | Status: AC
  Filled 2019-09-24: qty 1000

## 2019-09-24 MED ORDER — LORAZEPAM 1 MG PO TABS
1.0000 mg | ORAL_TABLET | ORAL | Status: AC | PRN
  Administered 2019-09-25 – 2019-09-27 (×10): 1 mg via ORAL
  Filled 2019-09-24 (×11): qty 1

## 2019-09-24 NOTE — Progress Notes (Signed)
   09/24/19 1300  Assess: MEWS Score  BP (!) 151/122  Pulse Rate (!) 125  Level of Consciousness Alert  Assess: MEWS Score  MEWS Temp 0  MEWS Systolic 0  MEWS Pulse 2  MEWS RR 1  MEWS LOC 0  MEWS Score 3  MEWS Score Color Yellow  MD called reference to increase VS. Also needing clarification on CIWA medication frequency.

## 2019-09-24 NOTE — ED Provider Notes (Signed)
Accepted handoff at shift change from [facility]. Please see prior provider note for more detail.   Briefly: Patient is 59 y.o.   Plan: Plan for prior provider is to follow-up on second troponin and disposition appropriately.     Physical Exam  BP (!) 151/122   Pulse (!) 125   Temp 98 F (36.7 C) (Oral)   Resp (!) 22   Ht 5\' 2"  (1.575 m)   Wt (!) 137 kg   SpO2 95%   BMI 55.24 kg/m   Physical Exam  ED Course/Procedures   Clinical Course as of Sep 23 1600  Sat Sep 24, 2019  1035 Dr. Alm Bustard admitting.   [WF]    Clinical Course User Index [WF] Tedd Sias, [place]    Procedures  Results for orders placed or performed during the hospital encounter of 09/23/19  SARS Coronavirus 2 by RT PCR (hospital order, performed in [facility] hospital lab) Nasopharyngeal Nasopharyngeal Swab   Specimen: Nasopharyngeal Swab  Result Value Ref Range   SARS Coronavirus 2 NEGATIVE NEGATIVE  Comprehensive metabolic panel  Result Value Ref Range   Sodium 138 135 - 145 mmol/L   Potassium 4.4 3.5 - 5.1 mmol/L   Chloride 97 (L) 98 - 111 mmol/L   CO2 20 (L) 22 - 32 mmol/L   Glucose, Bld 68 (L) 70 - 99 mg/dL   BUN 20 6 - 20 mg/dL   Creatinine, Ser 1.08 (H) 0.44 - 1.00 mg/dL   Calcium 8.8 (L) 8.9 - 10.3 mg/dL   Total Protein 8.1 6.5 - 8.1 g/dL   Albumin 3.6 3.5 - 5.0 g/dL   AST 70 (H) 15 - 41 U/L   ALT 50 (H) 0 - 44 U/L   Alkaline Phosphatase 124 38 - 126 U/L   Total Bilirubin 0.9 0.3 - 1.2 mg/dL   GFR calc non Af Amer 57 (L) >60 mL/min   GFR calc Af Amer >60 >60 mL/min   Anion gap 21 (H) 5 - 15  Ethanol  Result Value Ref Range   Alcohol, Ethyl (B) 281 (H) <10 mg/dL  cbc  Result Value Ref Range   WBC 9.7 4.0 - 10.5 K/uL   RBC 5.42 (H) 3.87 - 5.11 MIL/uL   Hemoglobin 15.6 (H) 12.0 - 15.0 g/dL   HCT 48.9 (H) 36 - 46 %   MCV 90.2 80.0 - 100.0 fL   MCH 28.8 26.0 - 34.0 pg   MCHC 31.9 30.0 - 36.0 g/dL   RDW 13.7 11.5 - 15.5 %   Platelets 293 150 - 400 K/uL   nRBC 0.2 0.0 -  0.2 %  Rapid urine drug screen (hospital performed)  Result Value Ref Range   Opiates NONE DETECTED NONE DETECTED   Cocaine NONE DETECTED NONE DETECTED   Benzodiazepines NONE DETECTED NONE DETECTED   Amphetamines NONE DETECTED NONE DETECTED   Tetrahydrocannabinol NONE DETECTED NONE DETECTED   Barbiturates NONE DETECTED NONE DETECTED  Lactic acid, plasma  Result Value Ref Range   Lactic Acid, Venous 2.6 (HH) 0.5 - 1.9 mmol/L  Lactic acid, plasma  Result Value Ref Range   Lactic Acid, Venous 1.8 0.5 - 1.9 mmol/L  Urinalysis, Routine w reflex microscopic Urine, Clean Catch  Result Value Ref Range   Color, Urine YELLOW YELLOW   APPearance TURBID (A) CLEAR   Specific Gravity, Urine 1.034 (H) 1.005 - 1.030   pH 5.0 5.0 - 8.0   Glucose, UA NEGATIVE NEGATIVE mg/dL   Hgb urine dipstick  MODERATE (A) NEGATIVE   Bilirubin Urine NEGATIVE NEGATIVE   Ketones, ur 80 (A) NEGATIVE mg/dL   Protein, ur 100 (A) NEGATIVE mg/dL   Nitrite NEGATIVE NEGATIVE   Leukocytes,Ua TRACE (A) NEGATIVE   RBC / HPF 0-5 0 - 5 RBC/hpf   WBC, UA 6-10 0 - 5 WBC/hpf   Bacteria, UA RARE (A) NONE SEEN   Squamous Epithelial / LPF 0-5 0 - 5   Mucus PRESENT    Amorphous Crystal PRESENT   HIV Antibody (routine testing w rflx)  Result Value Ref Range   HIV Screen 4th Generation wRfx Non Reactive Non Reactive  TSH  Result Value Ref Range   TSH 3.824 0.350 - 4.500 uIU/mL  I-Stat beta hCG blood, ED  Result Value Ref Range   I-stat hCG, quantitative <5.0 <5 mIU/mL   Comment 3          CBG monitoring, ED  Result Value Ref Range   Glucose-Capillary 69 (L) 70 - 99 mg/dL  I-Stat beta hCG blood, ED  Result Value Ref Range   I-stat hCG, quantitative <5.0 <5 mIU/mL   Comment 3          CBG monitoring, ED (now and then every hour for 3 hours)  Result Value Ref Range   Glucose-Capillary 83 70 - 99 mg/dL  CBG monitoring, ED (now and then every hour for 3 hours)  Result Value Ref Range   Glucose-Capillary 110 (H) 70 - 99  mg/dL  Troponin I (High Sensitivity)  Result Value Ref Range   Troponin I (High Sensitivity) 28 (H) <18 ng/L  Troponin I (High Sensitivity)  Result Value Ref Range   Troponin I (High Sensitivity) 32 (H) <18 ng/L  Troponin I (High Sensitivity)  Result Value Ref Range   Troponin I (High Sensitivity) 41 (H) <18 ng/L  Troponin I (High Sensitivity)  Result Value Ref Range   Troponin I (High Sensitivity) 42 (H) <18 ng/L   DG Chest 2 View  Result Date: 09/23/2019 CLINICAL DATA:  Chest pain EXAM: CHEST - 2 VIEW COMPARISON:  05/23/2019 chest radiograph.  07/06/2018 CTA chest. FINDINGS: Cardiomegaly. Hypoinflated lungs. No focal consolidation. No pneumothorax or pleural effusion. Multilevel spondylosis. IMPRESSION: No focal airspace disease. Cardiomegaly. Electronically Signed   By: Primitivo Gauze M.D.   On: 09/23/2019 16:22     MDM   On my reassessment patient is persistently tachycardic, my personal CIWA score patient was 12.  Provide patient with additional benzodiazepines and additional liter of normal saline and admitted patient to hospitalist service for active withdrawal with elevated CIWA score.      [name], [place] 09/24/19 1603    Breck Coons, MD 09/25/19 [phone]

## 2019-09-24 NOTE — ED Notes (Signed)
Pt was provided with [place] sandwich and gingerale and denies any N&V at this time.

## 2019-09-24 NOTE — ED Provider Notes (Signed)
[name] Provider Note   CSN: [id number] Arrival date & time: 09/23/19  1451     History Chief Complaint  Patient presents with  . Chest Pain    [name] is a 59 y.o. female with a history of alcohol use disorder, PE on Eliquis, HTN, HLD who presents to the emergency department with a chief complaint of dizziness.  The patient reports heavy alcohol use for the last year and a half, since the onset of the COVID-19 pandemic.  Prior to the COVID-19 pandemic, she would only drink on weekends.  However, since the pandemic began she has been drinking almost daily.  Reports that until a week ago that she was only drinking about a pint of vodka daily.  For the last week, she has been drinking approximately 1/5 of vodka daily.  She is feeling tremulous and nauseated.  She has not been eating or drinking well for the last few days due to binge drinking.  Yesterday, she developed multiple episodes of nonbloody, nonbilious vomiting and diarrhea.  Today, she developed chest pain and dizziness.  Dizziness has since resolved.  She also notes that chest pain has since resolved.  Chest pain was nonradiating, but she is unable to characterize it.  No known aggravating or alleviating factors.  She denies fever, chills, headache, numbness, visual changes, weakness, neck pain or stiffness, leg swelling, orthopnea, palpitations, shortness of breath, abdominal pain, or dysuria.  No treatment prior to arrival.  She would like assistance with alcohol cessation.  No history of seizures or DTs, but does endorse a history of alcohol withdrawal.  The history is provided by the patient and medical records. No language interpreter was used.       Past Medical History:  Diagnosis Date  . Alcohol abuse    abuse- moderate years ago - states only drinks now on weekends -2 to 8 driinks   . Asthma   . COLONIC POLYPS, HX OF 04/05/2010  . DIVERTICULITIS, HX OF 04/05/2010  .  DJD (degenerative joint disease)    right knee, mot to severe  . GERD (gastroesophageal reflux disease)    no meds  . Heart murmur    hx of   . Hyperlipidemia   . Hypertension   . Impaired glucose tolerance 12/06/2013  . Obesity (BMI 30-39.9)   . PALPITATIONS, HX OF 09/14/2007    Patient Active Problem List   Diagnosis Date Noted  . Alcoholic intoxication without complication ([place]) 81/44/8185  . Pneumonia due to COVID-19 virus 02/10/2019  . Pulmonary embolism ([place]) 07/06/2018  . Elevated troponin 07/06/2018  . Leukocytosis 09/22/2017  . Abnormal urine color 09/22/2017  . Rash 09/22/2017  . Sepsis ([place]) 09/12/2016  . Colovesical fistula 09/12/2016  . Hypertension 07/28/2016  . Alcohol abuse 07/28/2016  . Obesity (BMI 30-39.9) 07/28/2016  . Abscess of bladder 07/28/2016  . Colonic diverticulum 07/28/2016  . Acute hypokalemia 07/28/2016  . Hyperlipidemia 07/10/2016  . Mass of left side of neck 07/10/2016  . Head and neck lymphadenopathy 07/10/2016  . Acute sinus infection 01/25/2016  . Eustachian tube disorder 01/25/2016  . Asthma exacerbation 05/18/2015  . Peripheral edema 03/16/2015  . Asthma 03/16/2015  . Right shoulder pain 03/16/2015  . Hypokalemia 10/04/2014  . Upper airway cough syndrome 10/03/2014  . Severe obesity (BMI >= 40) ([place]) 10/03/2014  . Lower back pain 05/25/2014  . Bilateral shoulder pain 05/25/2014  . Chest pain 04/14/2014  . Allergic rhinitis 12/06/2013  . Impaired  glucose tolerance 12/06/2013  . Expected blood loss anemia 12/16/2012  . Morbid obesity ([place]) 12/15/2012  . S/P left TKA 12/14/2012  . Goiter 11/26/2012  . Preop exam for internal medicine 11/26/2012  . Diarrhea 02/25/2012  . Vaginal bleeding 04/30/2011  . Colon polyps 02/10/2011  . Eczema 02/10/2011  . Depression 02/10/2011  . Preventative health care 09/27/2010  . Anxiety state 04/05/2010  . DIVERTICULITIS, HX OF 04/05/2010  . PALPITATIONS, HX OF 09/14/2007  . Obesity  04/24/2007  . VOCAL CORD DISORDER 04/24/2007  . Extrinsic asthma 04/24/2007  . GERD 04/24/2007    Past Surgical History:  Procedure Laterality Date  . ABDOMINAL HYSTERECTOMY  age 29   fibroids  . COLONOSCOPY WITH PROPOFOL N/A 07/25/2016   Procedure: COLONOSCOPY WITH PROPOFOL;  Surgeon: Carol Ada, MD;  Location: [facility];  Service: Endoscopy;  Laterality: N/A;  . colonscopy     x 2  . IR RADIOLOGIST EVAL & MGMT  08/12/2016  . IR RADIOLOGIST EVAL & MGMT  08/21/2016  . KNEE ARTHROSCOPY     left   . TOTAL KNEE ARTHROPLASTY  07/29/2011   Procedure: TOTAL KNEE ARTHROPLASTY;  Surgeon: Mauri Pole, MD;  Location: [facility];  Service: Orthopedics;  Laterality: Right;  . TOTAL KNEE ARTHROPLASTY Left 12/14/2012   Procedure: LEFT TOTAL KNEE ARTHROPLASTY;  Surgeon: Mauri Pole, MD;  Location: [facility];  Service: Orthopedics;  Laterality: Left;     OB History    Gravida  1   Para  1   Term      Preterm      AB      Living  1     SAB      TAB      Ectopic      Multiple      Live Births              Family History  Problem Relation Age of Onset  . Stroke Mother   . COPD Father   . Lymphoma Sister     Social History   Tobacco Use  . Smoking status: Former Smoker    Packs/day: 0.50    Years: 7.00    Pack years: 3.50    Types: Cigarettes    Quit date: 02/11/1988    Years since quitting: 31.6  . Smokeless tobacco: Never Used  Vaping Use  . Vaping Use: Never used  Substance Use Topics  . Alcohol use: Yes    Comment: Occas  . Drug use: No    Comment: smoked marajuania x 10 years.  Quit in 1985.    Home Medications Prior to Admission medications   Medication Sig Start Date End Date Taking? Authorizing Provider  ADVAIR DISKUS 250-50 MCG/DOSE AEPB Inhale 1 puff into the lungs 2 (two) times daily. 04/14/19  Yes [provider]  albuterol (PROAIR HFA) 108 (90 Base) MCG/ACT inhaler INHALE 1 TO 2 PUFFS BY MOUTH EVERY 6 HOURS AS NEEDED FOR WHEEZE Patient  taking differently: Inhale 2 puffs into the lungs every 6 (six) hours as needed for shortness of breath.  01/14/19  Yes Tanda Rockers, MD  apixaban (ELIQUIS) 5 MG TABS tablet Take 1 tablet (5 mg total) by mouth 2 (two) times daily. 01/14/19  Yes Tanda Rockers, MD  atorvastatin (LIPITOR) 20 MG tablet Take 1 tablet (20 mg total) by mouth daily. 01/14/19 01/14/20 Yes Biagio Borg, MD  cimetidine (TAGAMET) 200 MG tablet Take 200 mg by mouth daily.  Yes [provider]  KLOR-CON M20 20 MEQ tablet TAKE 1 TABLET BY MOUTH EVERY DAY Patient taking differently: Take 10 mEq by mouth every other day.  04/26/19  Yes Biagio Borg, MD  losartan-hydrochlorothiazide (HYZAAR) 100-25 MG tablet TAKE 1 TABLET BY MOUTH EVERY DAY Patient taking differently: Take 1 tablet by mouth daily.  07/25/19  Yes Biagio Borg, MD  methocarbamol (ROBAXIN) 500 MG tablet Take 1 tablet (500 mg total) by mouth 4 (four) times daily. 07/21/19  Yes Biagio Borg, MD  desoximetasone (TOPICORT) 0.05 % cream Apply topically 2 (two) times daily. Patient not taking: Reported on 09/23/2019 07/21/19   Biagio Borg, MD  HYDROcodone-homatropine [facility]) 5-1.5 MG/5ML syrup Take 5 mLs by mouth every 6 (six) hours as needed for cough. Patient not taking: Reported on 09/23/2019 08/31/19   Biagio Borg, MD  ondansetron (ZOFRAN) 4 MG tablet Take 1 tablet (4 mg total) by mouth every 8 (eight) hours as needed for nausea or vomiting. Patient not taking: Reported on 09/23/2019 08/23/19   Biagio Borg, MD  phentermine 37.5 MG capsule Take 1 capsule (37.5 mg total) by mouth every morning. Patient not taking: Reported on 09/23/2019 08/16/19   Biagio Borg, MD    Allergies    Morphine, Shrimp [shellfish allergy], Tramadol, and Latex  Review of Systems   Review of Systems  Constitutional: Negative for activity change, chills, diaphoresis and fever.  HENT: Negative for congestion.   Eyes: Negative for visual disturbance.  Respiratory: Positive for  shortness of breath. Negative for wheezing.   Cardiovascular: Negative for chest pain, palpitations and leg swelling.  Gastrointestinal: Positive for diarrhea, nausea and vomiting. Negative for abdominal pain, anal bleeding, blood in stool and constipation.  Genitourinary: Negative for dysuria.  Musculoskeletal: Negative for back pain.  Skin: Negative for rash.  Allergic/Immunologic: Negative for immunocompromised state.  Neurological: Positive for dizziness and tremors. Negative for seizures, syncope, speech difficulty, weakness and headaches.  Psychiatric/Behavioral: Negative for confusion.    Physical Exam Updated Vital Signs BP 130/72   Pulse (!) 121   Temp 98.7 F (37.1 C) (Oral)   Resp (!) 30   Ht 5\' 2"  (1.575 m)   Wt (!) 137 kg   SpO2 97%   BMI 55.24 kg/m   Physical Exam Vitals and nursing note reviewed.  Constitutional:      General: She is not in acute distress.    Appearance: She is obese. She is not ill-appearing, toxic-appearing or diaphoretic.  HENT:     Head: Normocephalic.  Eyes:     Conjunctiva/sclera: Conjunctivae normal.  Cardiovascular:     Rate and Rhythm: Regular rhythm. Tachycardia present.     Pulses: Normal pulses.     Heart sounds: Normal heart sounds. No murmur heard.  No friction rub. No gallop.   Pulmonary:     Effort: Pulmonary effort is normal. No respiratory distress.     Breath sounds: No stridor. No wheezing, rhonchi or rales.  Chest:     Chest wall: No tenderness.  Abdominal:     General: There is no distension.     Palpations: Abdomen is soft. There is no mass.     Tenderness: There is no abdominal tenderness. There is no right CVA tenderness, left CVA tenderness, guarding or rebound.     Hernia: No hernia is present.  Musculoskeletal:     Cervical back: Neck supple.     Right lower leg: No edema.     Left  lower leg: No edema.  Skin:    General: Skin is warm and dry.     Findings: No rash.  Neurological:     Mental Status: She  is alert.     Comments: Tremor noted in the bilateral upper extremities.  Alert and oriented x4. No asterixis.  Psychiatric:        Behavior: Behavior normal.     ED Results / Procedures / Treatments   Labs (all labs ordered are listed, but only abnormal results are displayed) Labs Reviewed  COMPREHENSIVE METABOLIC PANEL - Abnormal; Notable for the following components:      Result Value   Chloride 97 (*)    CO2 20 (*)    Glucose, Bld 68 (*)    Creatinine, Ser 1.08 (*)    Calcium 8.8 (*)    AST 70 (*)    ALT 50 (*)    GFR calc non Af Amer 57 (*)    Anion gap 21 (*)    All other components within normal limits  ETHANOL - Abnormal; Notable for the following components:   Alcohol, Ethyl (B) 281 (*)    All other components within normal limits  CBC - Abnormal; Notable for the following components:   RBC 5.42 (*)    Hemoglobin 15.6 (*)    HCT 48.9 (*)    All other components within normal limits  CBG MONITORING, ED - Abnormal; Notable for the following components:   Glucose-Capillary 69 (*)    All other components within normal limits  CBG MONITORING, ED - Abnormal; Notable for the following components:   Glucose-Capillary 110 (*)    All other components within normal limits  TROPONIN I (HIGH SENSITIVITY) - Abnormal; Notable for the following components:   Troponin I (High Sensitivity) 28 (*)    All other components within normal limits  RAPID URINE DRUG SCREEN, HOSP PERFORMED  LACTIC ACID, PLASMA  LACTIC ACID, PLASMA  URINALYSIS, ROUTINE W REFLEX MICROSCOPIC  I-STAT BETA HCG BLOOD, ED ([facility], [facility], [facility] ONLY)  I-STAT BETA HCG BLOOD, ED ([facility], [facility], [facility] ONLY)  CBG MONITORING, ED  CBG MONITORING, ED  TROPONIN I (HIGH SENSITIVITY)  TROPONIN I (HIGH SENSITIVITY)    EKG None  Radiology DG Chest 2 View  Result Date: 09/23/2019 CLINICAL DATA:  Chest pain EXAM: CHEST - 2 VIEW COMPARISON:  05/23/2019 chest radiograph.  07/06/2018 CTA chest. FINDINGS: Cardiomegaly. Hypoinflated lungs.  No focal consolidation. No pneumothorax or pleural effusion. Multilevel spondylosis. IMPRESSION: No focal airspace disease. Cardiomegaly. Electronically Signed   By: Primitivo Gauze M.D.   On: 09/23/2019 16:22    Procedures Procedures (including critical care time)  Medications Ordered in ED Medications  LORazepam (ATIVAN) injection 0-4 mg ( Intravenous See Alternative 09/24/19 0653)    Or  LORazepam (ATIVAN) tablet 0-4 mg (1 mg Oral Given 09/24/19 0653)  LORazepam (ATIVAN) injection 0-4 mg (has no administration in time range)    Or  LORazepam (ATIVAN) tablet 0-4 mg (has no administration in time range)  thiamine tablet 100 mg (has no administration in time range)    Or  thiamine (B-1) injection 100 mg (has no administration in time range)  sodium chloride 0.9 % bolus 1,000 mL (0 mLs Intravenous Stopped 09/24/19 0653)  LORazepam (ATIVAN) injection 1 mg (1 mg Intravenous Given 09/24/19 7867)    ED Course  I have reviewed the triage vital signs and the nursing notes.  Pertinent labs & imaging results that were available during my care of the  patient were reviewed by me and considered in my medical decision making (see chart for details).    MDM Rules/Calculators/A&P                          59 year old female with a history of alcohol use disorder, PE on Eliquis, HTN, HLD who presents to the emergency department with a chief complaint of dizziness and chest pain in the setting of increased alcohol usage over the last week.  Patient has been drinking about a pint of vodka daily for the last year, but has been drinking 1/5 of vodka for the last week.  No SI, HI, or auditory visual hallucinations.  Given her prolonged alcohol use, she is at higher risk for alcohol withdrawal.  On my exam, she is tremulous, nauseated, and tachycardic in the 110s.  However, nursing staff reports her CIWA score to be 6.  I suspect this is actually higher.  However, we will give a dose of Ativan to help with  nausea and IV fluids to see if heart rate improves as there may be a component of dehydration as she has been having vomiting and diarrhea over the last year.  We will plan to reassess after IV fluids and initial dose of Ativan to see if she is clinically improved.  Patient was initially hypoglycemic at 58 yesterday afternoon.  This improved with food and fluid intake.  I suspect this was from decreased p.o. intake over the last few days related to alcohol use.  CBC appears hemoconcentrated.  Metabolic panel with a metabolic acidosis with an elevated anion gap.  This is most likely from alcoholic ketoacidosis, but will add on lactate for further evaluation.  UA is also pending.  Troponin is elevated.  This is likely from demand from tachycardia as patient currently denies chest pain.  Will repeat second troponin.  On reevaluation, patient CIWA score is 7 and after initial fluid bolus she is now tachycardic in the 120s and remains tremulous.  CIWA protocol algorithm has been placed.  Patient care transferred to [place] at the end of my shift to follow up on repeat troponin, CIWA score repeat, and clinical reassessment. Patient presentation, ED course, and plan of care discussed with review of all pertinent labs and imaging. Please see his/her note for further details regarding further ED course and disposition.   Final Clinical Impression(s) / ED Diagnoses Final diagnoses:  None    Rx / DC Orders ED Discharge Orders    None       Joanne Gavel, PA-C 09/24/19 0741    Davonna Belling, MD 09/29/19 [phone]

## 2019-09-24 NOTE — H&P (Signed)
History and Physical    [name] CBS:[id number] DOB: March 28, 1960 DOA: 09/23/2019  PCP: Biagio Borg, MD   Patient coming from: Home  I have personally briefly reviewed patient's old medical records in [place]  Chief Complaint: Chest pain  HPI: [name] is a 59 y.o. female with medical history significant for morbid obesity, alcohol dependence, hypertension and pulmonary embolism on Eliquis who presents to the emergency room for evaluation of chest pain which was mostly over the left anterior chest wall, it was nonradiating and she denied having any associated shortness of breath, palpitations or diaphoresis.  She denies having any known aggravating or relieving factors. Patient states that she used to be a weekend drinker but since the onset of the pandemic about a year and half ago she started drinking heavily every day and admits to having symptoms of alcohol withdrawal when she does not drink.  She states that she drinks about 1/5 of vodka daily.  She has not been eating or drinking well due to the alcohol binge and complains of nausea associated with vomiting and diarrhea.  She denies having any abdominal pain. Her last drink was the day prior to coming to the emergency room and she is now tremulous.  She admits to having symptoms of alcohol withdrawal when she does not drink. In the ER she was noted to be hypoglycemic with blood sugar of 68 that improved following food and fluid intake.  She was tachycardic with heart rate in the 120s and had a CIWA score of 7 for which CIWA protocol was initiated and she received a dose of lorazepam. She remains tachycardic with an increase in her CIWA score and so will be hospitalized. Patient is requesting assistance for her alcohol dependence. Labs reveal a sodium of 138, potassium of 4.4, chloride of 97, glucose of 68, BUN of 20, creatinine of 1.08, AST of 70, ALT of 50, troponin of 32, initial lactate of 2.6 >> 1.8 Twelve-lead EKG  reviewed by me shows sinus tachycardia    ED Course: Patient is a 60 year old African-American female who presents to the ER for evaluation of chest pain and nausea.  She admits to heavy alcohol use and while in the ER was noted to be tremulous.  CIWA score was 7 for which patient received Ativan and IV fluids.  She will be admitted to the hospital for alcohol withdrawal.  Review of Systems: As per HPI otherwise 10 point review of systems negative.    Past Medical History:  Diagnosis Date  . Alcohol abuse    abuse- moderate years ago - states only drinks now on weekends -2 to 8 driinks   . Asthma   . COLONIC POLYPS, HX OF 04/05/2010  . DIVERTICULITIS, HX OF 04/05/2010  . DJD (degenerative joint disease)    right knee, mot to severe  . GERD (gastroesophageal reflux disease)    no meds  . Heart murmur    hx of   . Hyperlipidemia   . Hypertension   . Impaired glucose tolerance 12/06/2013  . Obesity (BMI 30-39.9)   . PALPITATIONS, HX OF 09/14/2007    Past Surgical History:  Procedure Laterality Date  . ABDOMINAL HYSTERECTOMY  age 74   fibroids  . COLONOSCOPY WITH PROPOFOL N/A 07/25/2016   Procedure: COLONOSCOPY WITH PROPOFOL;  Surgeon: Carol Ada, MD;  Location: [facility];  Service: Endoscopy;  Laterality: N/A;  . colonscopy     x 2  . IR RADIOLOGIST EVAL &  MGMT  08/12/2016  . IR RADIOLOGIST EVAL & MGMT  08/21/2016  . KNEE ARTHROSCOPY     left   . TOTAL KNEE ARTHROPLASTY  07/29/2011   Procedure: TOTAL KNEE ARTHROPLASTY;  Surgeon: Mauri Pole, MD;  Location: [facility];  Service: Orthopedics;  Laterality: Right;  . TOTAL KNEE ARTHROPLASTY Left 12/14/2012   Procedure: LEFT TOTAL KNEE ARTHROPLASTY;  Surgeon: Mauri Pole, MD;  Location: [facility];  Service: Orthopedics;  Laterality: Left;     reports that she quit smoking about 31 years ago. Her smoking use included cigarettes. She has a 3.50 pack-year smoking history. She has never used smokeless tobacco. She reports current  alcohol use. She reports that she does not use drugs.  Allergies  Allergen Reactions  . Morphine Itching  . Shrimp [Shellfish Allergy] Itching and Other (See Comments)    Tongue burns also  . Tramadol Other (See Comments)    Caused confusion  . Latex Rash    Family History  Problem Relation Age of Onset  . Stroke Mother   . COPD Father   . Lymphoma Sister      Prior to Admission medications   Medication Sig Start Date End Date Taking? Authorizing Provider  ADVAIR DISKUS 250-50 MCG/DOSE AEPB Inhale 1 puff into the lungs 2 (two) times daily. 04/14/19  Yes [provider]  albuterol (PROAIR HFA) 108 (90 Base) MCG/ACT inhaler INHALE 1 TO 2 PUFFS BY MOUTH EVERY 6 HOURS AS NEEDED FOR WHEEZE Patient taking differently: Inhale 2 puffs into the lungs every 6 (six) hours as needed for shortness of breath.  01/14/19  Yes Tanda Rockers, MD  apixaban (ELIQUIS) 5 MG TABS tablet Take 1 tablet (5 mg total) by mouth 2 (two) times daily. 01/14/19  Yes Tanda Rockers, MD  atorvastatin (LIPITOR) 20 MG tablet Take 1 tablet (20 mg total) by mouth daily. 01/14/19 01/14/20 Yes Biagio Borg, MD  cimetidine (TAGAMET) 200 MG tablet Take 200 mg by mouth daily.    Yes [provider]  KLOR-CON M20 20 MEQ tablet TAKE 1 TABLET BY MOUTH EVERY DAY Patient taking differently: Take 10 mEq by mouth every other day.  04/26/19  Yes Biagio Borg, MD  losartan-hydrochlorothiazide (HYZAAR) 100-25 MG tablet TAKE 1 TABLET BY MOUTH EVERY DAY Patient taking differently: Take 1 tablet by mouth daily.  07/25/19  Yes Biagio Borg, MD  methocarbamol (ROBAXIN) 500 MG tablet Take 1 tablet (500 mg total) by mouth 4 (four) times daily. 07/21/19  Yes Biagio Borg, MD  desoximetasone (TOPICORT) 0.05 % cream Apply topically 2 (two) times daily. Patient not taking: Reported on 09/23/2019 07/21/19   Biagio Borg, MD  HYDROcodone-homatropine [facility]) 5-1.5 MG/5ML syrup Take 5 mLs by mouth every 6 (six) hours as needed for  cough. Patient not taking: Reported on 09/23/2019 08/31/19   Biagio Borg, MD  ondansetron (ZOFRAN) 4 MG tablet Take 1 tablet (4 mg total) by mouth every 8 (eight) hours as needed for nausea or vomiting. Patient not taking: Reported on 09/23/2019 08/23/19   Biagio Borg, MD  phentermine 37.5 MG capsule Take 1 capsule (37.5 mg total) by mouth every morning. Patient not taking: Reported on 09/23/2019 08/16/19   Biagio Borg, MD    Physical Exam: Vitals:   09/24/19 1113 09/24/19 1129 09/24/19 1129 09/24/19 1200  BP:   (!) 168/83 (!) 154/81  Pulse: (!) 112 (!) 111 (!) 110 (!) 114  Resp: (!) 26 20 20  (!)  22  Temp:      TempSrc:      SpO2: 96% 94% 95% 95%  Weight:      Height:         Vitals:   09/24/19 1113 09/24/19 1129 09/24/19 1129 09/24/19 1200  BP:   (!) 168/83 (!) 154/81  Pulse: (!) 112 (!) 111 (!) 110 (!) 114  Resp: (!) 26 20 20  (!) 22  Temp:      TempSrc:      SpO2: 96% 94% 95% 95%  Weight:      Height:        Constitutional: NAD, alert and oriented x 3. Eyes: PERRL, lids and conjunctivae normal ENMT: Mucous membranes are moist.  Neck: normal, supple, no masses, no thyromegaly Respiratory: clear to auscultation bilaterally, no wheezing, no crackles. Normal respiratory effort. No accessory muscle use.  Cardiovascular: Tachycardic, no murmurs / rubs / gallops. No extremity edema. 2+ pedal pulses. No carotid bruits.  Abdomen: no tenderness, no masses palpated. No hepatosplenomegaly. Bowel sounds positive.  Central adiposity Musculoskeletal: no clubbing / cyanosis. No joint deformity upper and lower extremities.  Skin: no rashes, lesions, ulcers.  Neurologic: No gross focal neurologic deficit. Psychiatric: Normal mood and affect.   Labs on Admission: I have personally reviewed following labs and imaging studies  CBC: Recent Labs  Lab 09/23/19 1530  WBC 9.7  HGB 15.6*  HCT 48.9*  MCV 90.2  PLT 176   Basic Metabolic Panel: Recent Labs  Lab 09/23/19 1530  NA 138   K 4.4  CL 97*  CO2 20*  GLUCOSE 68*  BUN 20  CREATININE 1.08*  CALCIUM 8.8*   GFR: Estimated Creatinine Clearance: 76.1 mL/min (A) (by C-G formula based on SCr of 1.08 mg/dL (H)). Liver Function Tests: Recent Labs  Lab 09/23/19 1530  AST 70*  ALT 50*  ALKPHOS 124  BILITOT 0.9  PROT 8.1  ALBUMIN 3.6   No results for input(s): LIPASE, AMYLASE in the last 168 hours. No results for input(s): AMMONIA in the last 168 hours. Coagulation Profile: No results for input(s): INR, PROTIME in the last 168 hours. Cardiac Enzymes: No results for input(s): CKTOTAL, CKMB, CKMBINDEX, TROPONINI in the last 168 hours. BNP (last 3 results) No results for input(s): PROBNP in the last 8760 hours. HbA1C: No results for input(s): HGBA1C in the last 72 hours. CBG: Recent Labs  Lab 09/23/19 1456 09/24/19 0431 09/24/19 0540  GLUCAP 69* 83 110*   Lipid Profile: No results for input(s): CHOL, HDL, LDLCALC, TRIG, CHOLHDL, LDLDIRECT in the last 72 hours. Thyroid Function Tests: No results for input(s): TSH, T4TOTAL, FREET4, T3FREE, THYROIDAB in the last 72 hours. Anemia Panel: No results for input(s): VITAMINB12, FOLATE, FERRITIN, TIBC, IRON, RETICCTPCT in the last 72 hours. Urine analysis:    Component Value Date/Time   COLORURINE YELLOW 09/24/2019 0527   APPEARANCEUR TURBID (A) 09/24/2019 0527   LABSPEC 1.034 (H) 09/24/2019 0527   PHURINE 5.0 09/24/2019 0527   GLUCOSEU NEGATIVE 09/24/2019 0527   GLUCOSEU NEGATIVE 07/21/2019 1628   HGBUR MODERATE (A) 09/24/2019 0527   BILIRUBINUR NEGATIVE 09/24/2019 0527   BILIRUBINUR negative 09/16/2011 1047   KETONESUR 80 (A) 09/24/2019 0527   PROTEINUR 100 (A) 09/24/2019 0527   UROBILINOGEN 0.2 07/21/2019 1628   NITRITE NEGATIVE 09/24/2019 0527   LEUKOCYTESUR TRACE (A) 09/24/2019 0527    Radiological Exams on Admission: DG Chest 2 View  Result Date: 09/23/2019 CLINICAL DATA:  Chest pain EXAM: CHEST - 2 VIEW COMPARISON:  05/23/2019  chest  radiograph.  07/06/2018 CTA chest. FINDINGS: Cardiomegaly. Hypoinflated lungs. No focal consolidation. No pneumothorax or pleural effusion. Multilevel spondylosis. IMPRESSION: No focal airspace disease. Cardiomegaly. Electronically Signed   By: Primitivo Gauze M.D.   On: 09/23/2019 16:22    EKG: Independently reviewed.  Sinus tachycardia  Assessment/Plan Principal Problem:   Alcohol withdrawal (HCC) Active Problems:   Obesity, Class III, BMI 40-49.9 (morbid obesity) ([place])   Alcohol abuse   Pulmonary embolism (HCC)    Alcohol withdrawal Patient has a history of alcohol abuse and presents to the ER for evaluation of chest pain. She has developed symptoms of alcohol withdrawal and is currently tachycardic and tremulous. We will place patient on lorazepam and administered for CIWA score of 8 or greater We will place patient on clonidine 0.5 mg twice daily Patient has been counseled on the need to abstain from further alcohol use   Morbid obesity (BMI 55) Complicates overall prognosis and care Lifestyle modification and exercise has been discussed with patient   History of pulmonary embolism Continue apixaban   Chest pain Unclear etiology Patient has had a slight bump in her troponin levels which may be tachycardia induced Continue aspirin and statins   DVT prophylaxis: Apixaban Code Status: Full  Family Communication: Greater than 50% of time was spent discussing patient's condition and plan of care with her at the bedside.  All questions and concerns have been addressed.  She verbalizes understanding and agrees with the plan. Disposition Plan: Back to previous home environment Consults called: None    Kail Fraley MD [facility]     09/24/2019, 12:24 PM

## 2019-09-25 DIAGNOSIS — Z6841 Body Mass Index (BMI) 40.0 and over, adult: Secondary | ICD-10-CM

## 2019-09-25 LAB — CBC WITH DIFFERENTIAL/PLATELET
Abs Immature Granulocytes: 0.04 10*3/uL (ref 0.00–0.07)
Basophils Absolute: 0 10*3/uL (ref 0.0–0.1)
Basophils Relative: 0 %
Eosinophils Absolute: 0.1 10*3/uL (ref 0.0–0.5)
Eosinophils Relative: 2 %
HCT: 43.6 % (ref 36.0–46.0)
Hemoglobin: 13.8 g/dL (ref 12.0–15.0)
Immature Granulocytes: 1 %
Lymphocytes Relative: 14 %
Lymphs Abs: 1.2 10*3/uL (ref 0.7–4.0)
MCH: 29.2 pg (ref 26.0–34.0)
MCHC: 31.7 g/dL (ref 30.0–36.0)
MCV: 92.2 fL (ref 80.0–100.0)
Monocytes Absolute: 0.5 10*3/uL (ref 0.1–1.0)
Monocytes Relative: 6 %
Neutro Abs: 6.5 10*3/uL (ref 1.7–7.7)
Neutrophils Relative %: 77 %
Platelets: 212 10*3/uL (ref 150–400)
RBC: 4.73 MIL/uL (ref 3.87–5.11)
RDW: 13.7 % (ref 11.5–15.5)
WBC: 8.4 10*3/uL (ref 4.0–10.5)
nRBC: 0 % (ref 0.0–0.2)

## 2019-09-25 LAB — COMPREHENSIVE METABOLIC PANEL
ALT: 54 U/L — ABNORMAL HIGH (ref 0–44)
AST: 81 U/L — ABNORMAL HIGH (ref 15–41)
Albumin: 3.4 g/dL — ABNORMAL LOW (ref 3.5–5.0)
Alkaline Phosphatase: 93 U/L (ref 38–126)
Anion gap: 12 (ref 5–15)
BUN: 16 mg/dL (ref 6–20)
CO2: 24 mmol/L (ref 22–32)
Calcium: 8.9 mg/dL (ref 8.9–10.3)
Chloride: 100 mmol/L (ref 98–111)
Creatinine, Ser: 1.06 mg/dL — ABNORMAL HIGH (ref 0.44–1.00)
GFR calc Af Amer: >60 mL/min (ref >60)
GFR calc non Af Amer: 58 mL/min — ABNORMAL LOW (ref >60)
Glucose, Bld: 244 mg/dL — ABNORMAL HIGH (ref 70–99)
Potassium: 4.1 mmol/L (ref 3.5–5.1)
Sodium: 136 mmol/L (ref 135–145)
Total Bilirubin: 1.1 mg/dL (ref 0.3–1.2)
Total Protein: 7.2 g/dL (ref 6.5–8.1)

## 2019-09-25 LAB — PHOSPHORUS: Phosphorus: 1.9 mg/dL — ABNORMAL LOW (ref 2.5–4.6)

## 2019-09-25 LAB — MAGNESIUM: Magnesium: 2.1 mg/dL (ref 1.7–2.4)

## 2019-09-25 MED ORDER — HYDRALAZINE HCL 20 MG/ML IJ SOLN: 5.0000 mg | INTRAMUSCULAR | Status: DC | PRN

## 2019-09-25 MED ORDER — SODIUM PHOSPHATES 45 MMOLE/15ML IV SOLN
10.0000 mmol | Freq: Once | INTRAVENOUS | Status: AC
  Administered 2019-09-25: 10 mmol via INTRAVENOUS
  Filled 2019-09-25: qty 3.33

## 2019-09-25 MED ORDER — SODIUM CHLORIDE 0.9 % IV SOLN: INTRAVENOUS | Status: DC

## 2019-09-25 MED ORDER — SENNOSIDES-DOCUSATE SODIUM 8.6-50 MG PO TABS
1.0000 | ORAL_TABLET | Freq: Two times a day (BID) | ORAL | Status: DC
  Administered 2019-09-25 – 2019-09-29 (×9): 1 via ORAL
  Filled 2019-09-25 (×9): qty 1

## 2019-09-25 MED ORDER — POLYETHYLENE GLYCOL 3350 17 G PO PACK
17.0000 g | PACK | Freq: Two times a day (BID) | ORAL | Status: DC
  Administered 2019-09-25 (×2): 17 g via ORAL
  Filled 2019-09-25 (×7): qty 1

## 2019-09-25 NOTE — Progress Notes (Signed)
PROGRESS NOTE    [name]  YBO:[id number] DOB: 03-18-1960 DOA: 09/23/2019 PCP: Biagio Borg, MD   Brief Narrative:  HPI per Dr. Collier Bullock on 09/24/19  [name] is a 59 y.o. female with medical history significant for morbid obesity, alcohol dependence, hypertension and pulmonary embolism on Eliquis who presents to the emergency room for evaluation of chest pain which was mostly over the left anterior chest wall, it was nonradiating and she denied having any associated shortness of breath, palpitations or diaphoresis.  She denies having any known aggravating or relieving factors. Patient states that she used to be a weekend drinker but since the onset of the pandemic about a year and half ago she started drinking heavily every day and admits to having symptoms of alcohol withdrawal when she does not drink.  She states that she drinks about 1/5 of vodka daily.  She has not been eating or drinking well due to the alcohol binge and complains of nausea associated with vomiting and diarrhea.  She denies having any abdominal pain. Her last drink was the day prior to coming to the emergency room and she is now tremulous.  She admits to having symptoms of alcohol withdrawal when she does not drink. In the ER she was noted to be hypoglycemic with blood sugar of 68 that improved following food and fluid intake.  She was tachycardic with heart rate in the 120s and had a CIWA score of 7 for which CIWA protocol was initiated and she received a dose of lorazepam. She remains tachycardic with an increase in her CIWA score and so will be hospitalized. Patient is requesting assistance for her alcohol dependence. Labs reveal a sodium of 138, potassium of 4.4, chloride of 97, glucose of 68, BUN of 20, creatinine of 1.08, AST of 70, ALT of 50, troponin of 32, initial lactate of 2.6 >> 1.8 Twelve-lead EKG reviewed by me shows sinus tachycardia    ED Course: Patient is a 59 year old  African-American female who presents to the ER for evaluation of chest pain and nausea.  She admits to heavy alcohol use and while in the ER was noted to be tremulous.  CIWA score was 7 for which patient received Ativan and IV fluids.  She will be admitted to the hospital for alcohol withdrawal.  **Interim History  Patient is still actively withdrawing and having some tremors and hallucinating.  Will remain inpatient and continue to treat her with withdrawals with CIWA protocol.  Renal function remains stable from yesterday and her Phos level still low.  LFTs are still abnormal and will need to continue to monitor carefully.  Assessment & Plan:   Principal Problem:   Alcohol withdrawal ([place]) Active Problems:   Obesity, Class III, BMI 40-49.9 (morbid obesity) (HCC)   Alcohol abuse   Pulmonary embolism (HCC)   Elevated troponin  Alcohol Abuse with Alcohol Withdrawal -Patient has a history of alcohol abuse and presents to the ER for evaluation of chest pain. -She has developed symptoms of alcohol withdrawal and is currently tachycardic and tremulous. -We will place patient on lorazepam and administered for CIWA score of 8 or greater -C/w Clonidine 0.5 mg twice daily -Patient has been counseled on the need to abstain from further alcohol use -Patient is still actively withdrawing and was tremulous and hallucinating -C/w Supportive Care and resume IVF Hydration with NS at 75 mL/hr -If further clinically decompensates will need Precedex and transferred to the ICU -UDS was Negative  -Drinks  1/5 of Vodka Daily  -Continues to be tremulous and hallucinating  -EtOH Level was 281 on Admission  -Consult SW for Resources   History of Pulmonary Embolism -Continue Anticoagulation with Apixaban  Chest Pain -Unclear etiology -CXR done and showed "No focal airspace disease. Cardiomegaly." -TSH was normal  -EKG on admission showed Sinus Tachycardia but no real evidence of ST Elevation or  Depression on my interpretation.  -Patient has had a slight bump in her troponin levels which may be tachycardia induced; Troponin level went from 28 -> 42 -Continue Aspirin 81 mg po Daily Will hold Atorvastatin 20 mg po Daily given abnormal LFTs -Chest Pain had Resolved this AM -C/w Cardiac Telemetry   HTN -Added Hydralazine -Continue to Monitor closely  Abnormal LFTs -In the setting of Alcoholism -Patient's AST went from 70 -> 81 and ALT went from 50 -> 54 -If necessary will check RUQ U/S and Acute Hepatitis Panel -Continue to Monitor and Trend Hepatic Fxn Panel -Repeat CMP in the AM   Hypophosphatemia -Patient's Phos Level was 1.9 -Replete with IV Sodium Phos 20 mmol -Continue to Monitor and Replete as Necessary -Repeat Phos Level in the AM   Lactic Acidosis -Patient's LA now improved to 1.8 -Resumed IVF with NS for 75 mL/hr for 12 hours -Continue to Monitor  Super Morbidly Obese -Estimated body mass index is 55.24 kg/m as calculated from the following:   Height as of this encounter: 5\' 2"  (1.575 m).   Weight as of this encounter: 716 kg.  -Complicates overall prognosis and care -Continued Weight Loss and Dietary Counseling; Lifestyle modification and exercise has been discussed with patient  Erythrocytosis  -Likely in the setting of Hemoconcentration on Admission -Patient's Hgb/Hct went from 15.6/48.9 -> 13.8/43.6 -Continue to Monitor and Trend -Repeat CBC in the AM   DVT prophylaxis: Anticoagulated with Apixaban  Code Status: FULL CODE  Family Communication: No family present at bedside  Disposition Plan: Pending improvement of Alcohol Withdrawal   Status is: Inpatient  Remains inpatient appropriate because:Unsafe d/c plan, IV treatments appropriate due to intensity of illness or inability to take PO and Inpatient level of care appropriate due to severity of illness   Dispo: The patient is from: Home              Anticipated d/c is to: Home               Anticipated d/c date is: 2 days              Patient currently is not medically stable to d/c.  Consultants:   None  Procedures: None  Antimicrobials: Anti-infectives (From admission, onward)   None     Subjective: Seen and examined at bedside and she had been hallucinating earlier.  No nausea or vomiting now but still continues to be a little bit tremulous.  Denies any chest pain now.  No burning or discomfort in her urine.  No other concerns or complaints at this time.  Objective: Vitals:   09/25/19 0000 09/25/19 0500 09/25/19 0719 09/25/19 0742  BP: (!) 154/89 (!) 165/102  (!) 151/93  Pulse: (!) 103 94  92  Resp:  (!) 22    Temp:  97.8 F (36.6 C)    TempSrc:  Oral    SpO2:  94% 98% 97%  Weight:  (!) 137 kg    Height:        Intake/Output Summary (Last 24 hours) at 09/25/2019 0817 Last data filed at 09/25/2019 0510 Gross per  24 hour  Intake 1000 ml  Output 100 ml  Net 900 ml   Filed Weights   09/23/19 1504 09/25/19 0500  Weight: (!) 137 kg (!) 137 kg   Examination: Physical Exam:  Constitutional: WN/WD super morbidly obese African-American female sitting in a chair at bedside and alert tremulous and had been hallucinating earlier. Eyes: Lids and conjunctivae normal, sclerae anicteric  ENMT: External Ears, Nose appear normal. Grossly normal hearing. Neck: Appears normal, supple, no cervical masses, normal ROM, no appreciable thyromegaly; no JVD Respiratory: Diminished to auscultation bilaterally, no wheezing, rales, rhonchi or crackles. Normal respiratory effort and patient is not tachypenic. No accessory muscle use.  Unlabored breathing Cardiovascular: RRR, no murmurs / rubs / gallops. S1 and S2 auscultated.  Mild extremity edema.  Abdomen: Soft, non-tender, significantly distended secondary to her body habitus. Bowel sounds positive.  GU: Deferred. Musculoskeletal: No clubbing / cyanosis of digits/nails. No joint deformity upper and lower extremities.  Skin: No  rashes, lesions, ulcers on his skin evaluation. No induration; Warm and dry.  Neurologic: CN 2-12 grossly intact with no focal deficits. Romberg sign and cerebellar reflexes not assessed.  Psychiatric: Normal judgment and insight. Alert and oriented x 3. Normal mood and appropriate affect.   Data Reviewed: I have personally reviewed following labs and imaging studies  CBC: Recent Labs  Lab 09/23/19 1530  WBC 9.7  HGB 15.6*  HCT 48.9*  MCV 90.2  PLT 132   Basic Metabolic Panel: Recent Labs  Lab 09/23/19 1530 09/24/19 1601  NA 138  --   K 4.4  --   CL 97*  --   CO2 20*  --   GLUCOSE 68*  --   BUN 20  --   CREATININE 1.08*  --   CALCIUM 8.8*  --   MG  --  2.0  PHOS  --  1.9*   GFR: Estimated Creatinine Clearance: 76.1 mL/min (A) (by C-G formula based on SCr of 1.08 mg/dL (H)). Liver Function Tests: Recent Labs  Lab 09/23/19 1530  AST 70*  ALT 50*  ALKPHOS 124  BILITOT 0.9  PROT 8.1  ALBUMIN 3.6   No results for input(s): LIPASE, AMYLASE in the last 168 hours. No results for input(s): AMMONIA in the last 168 hours. Coagulation Profile: No results for input(s): INR, PROTIME in the last 168 hours. Cardiac Enzymes: No results for input(s): CKTOTAL, CKMB, CKMBINDEX, TROPONINI in the last 168 hours. BNP (last 3 results) No results for input(s): PROBNP in the last 8760 hours. HbA1C: No results for input(s): HGBA1C in the last 72 hours. CBG: Recent Labs  Lab 09/23/19 1456 09/24/19 0431 09/24/19 0540  GLUCAP 69* 83 110*   Lipid Profile: No results for input(s): CHOL, HDL, LDLCALC, TRIG, CHOLHDL, LDLDIRECT in the last 72 hours. Thyroid Function Tests: Recent Labs    09/24/19 1305  TSH 3.824   Anemia Panel: No results for input(s): VITAMINB12, FOLATE, FERRITIN, TIBC, IRON, RETICCTPCT in the last 72 hours. Sepsis Labs: Recent Labs  Lab 09/24/19 0807 09/24/19 1016  LATICACIDVEN 2.6* 1.8    Recent Results (from the past 240 hour(s))  SARS Coronavirus 2  by RT PCR (hospital order, performed in [facility] hospital lab) Nasopharyngeal Nasopharyngeal Swab     Status: None   Collection Time: 09/24/19 10:11 AM   Specimen: Nasopharyngeal Swab  Result Value Ref Range Status   SARS Coronavirus 2 NEGATIVE NEGATIVE Final    Comment: (NOTE) SARS-CoV-2 target nucleic acids are NOT DETECTED.  The SARS-CoV-2  RNA is generally detectable in upper and lower respiratory specimens during the acute phase of infection. The lowest concentration of SARS-CoV-2 viral copies this assay can detect is 250 copies / mL. A negative result does not preclude SARS-CoV-2 infection and should not be used as the sole basis for treatment or other patient management decisions.  A negative result may occur with improper specimen collection / handling, submission of specimen other than nasopharyngeal swab, presence of viral mutation(s) within the areas targeted by this assay, and inadequate number of viral copies (<250 copies / mL). A negative result must be combined with clinical observations, patient history, and epidemiological information.  Fact Sheet for Patients:   StrictlyIdeas.no  Fact Sheet for Healthcare Providers: BankingDealers.co.za  This test is not yet approved or  cleared by the [place] FDA and has been authorized for detection and/or diagnosis of SARS-CoV-2 by FDA under an Emergency Use Authorization (EUA).  This EUA will remain in effect (meaning this test can be used) for the duration of the COVID-19 declaration under Section 564(b)(1) of the Act, 21 U.S.C. section 360bbb-3(b)(1), unless the authorization is terminated or revoked sooner.  Performed at [facility], [place] [address] [place],  [address]      RN Pressure Injury Documentation:     Estimated body mass index is 55.24 kg/m as calculated from the following:   Height as of this encounter: 5\' 2"  (1.575 m).   Weight as of  this encounter: 137 kg.  Malnutrition Type:      Malnutrition Characteristics:      Nutrition Interventions:     Radiology Studies: DG Chest 2 View  Result Date: 09/23/2019 CLINICAL DATA:  Chest pain EXAM: CHEST - 2 VIEW COMPARISON:  05/23/2019 chest radiograph.  07/06/2018 CTA chest. FINDINGS: Cardiomegaly. Hypoinflated lungs. No focal consolidation. No pneumothorax or pleural effusion. Multilevel spondylosis. IMPRESSION: No focal airspace disease. Cardiomegaly. Electronically Signed   By: Primitivo Gauze M.D.   On: 09/23/2019 16:22   Scheduled Meds: . apixaban  5 mg Oral BID  . aspirin EC  81 mg Oral Daily  . atorvastatin  20 mg Oral Daily  . cloNIDine  0.1 mg Oral BID  . famotidine  20 mg Oral Daily  . methocarbamol  500 mg Oral QID  . mometasone-formoterol  2 puff Inhalation BID  . multivitamin with minerals  1 tablet Oral Daily  . thiamine  100 mg Oral Daily   Or  . thiamine  100 mg Intravenous Daily   Continuous Infusions:   LOS: 1 day   Kerney Elbe, DO [facility] PAGER is on [place]  If 7PM-7AM, please contact night-coverage [email or web address]

## 2019-09-26 ENCOUNTER — Inpatient Hospital Stay (HOSPITAL_COMMUNITY): Payer: BC Managed Care – PPO

## 2019-09-26 DIAGNOSIS — R945 Abnormal results of liver function studies: Secondary | ICD-10-CM

## 2019-09-26 LAB — COMPREHENSIVE METABOLIC PANEL
ALT: 77 U/L — ABNORMAL HIGH (ref 0–44)
AST: 110 U/L — ABNORMAL HIGH (ref 15–41)
Albumin: 2.9 g/dL — ABNORMAL LOW (ref 3.5–5.0)
Alkaline Phosphatase: 91 U/L (ref 38–126)
Anion gap: 10 (ref 5–15)
BUN: 12 mg/dL (ref 6–20)
CO2: 23 mmol/L (ref 22–32)
Calcium: 8.4 mg/dL — ABNORMAL LOW (ref 8.9–10.3)
Chloride: 104 mmol/L (ref 98–111)
Creatinine, Ser: 0.81 mg/dL (ref 0.44–1.00)
GFR calc Af Amer: >60 mL/min (ref >60)
GFR calc non Af Amer: >60 mL/min (ref >60)
Glucose, Bld: 138 mg/dL — ABNORMAL HIGH (ref 70–99)
Potassium: 3.9 mmol/L (ref 3.5–5.1)
Sodium: 137 mmol/L (ref 135–145)
Total Bilirubin: 0.8 mg/dL (ref 0.3–1.2)
Total Protein: 6.2 g/dL — ABNORMAL LOW (ref 6.5–8.1)

## 2019-09-26 LAB — CBC WITH DIFFERENTIAL/PLATELET
Abs Immature Granulocytes: 0.03 10*3/uL (ref 0.00–0.07)
Basophils Absolute: 0 10*3/uL (ref 0.0–0.1)
Basophils Relative: 0 %
Eosinophils Absolute: 0.2 10*3/uL (ref 0.0–0.5)
Eosinophils Relative: 2 %
HCT: 40.9 % (ref 36.0–46.0)
Hemoglobin: 12.9 g/dL (ref 12.0–15.0)
Immature Granulocytes: 0 %
Lymphocytes Relative: 15 %
Lymphs Abs: 1.1 10*3/uL (ref 0.7–4.0)
MCH: 29.5 pg (ref 26.0–34.0)
MCHC: 31.5 g/dL (ref 30.0–36.0)
MCV: 93.6 fL (ref 80.0–100.0)
Monocytes Absolute: 0.7 10*3/uL (ref 0.1–1.0)
Monocytes Relative: 9 %
Neutro Abs: 5.6 10*3/uL (ref 1.7–7.7)
Neutrophils Relative %: 74 %
Platelets: 157 10*3/uL (ref 150–400)
RBC: 4.37 MIL/uL (ref 3.87–5.11)
RDW: 13.7 % (ref 11.5–15.5)
WBC: 7.7 10*3/uL (ref 4.0–10.5)
nRBC: 0 % (ref 0.0–0.2)

## 2019-09-26 LAB — MAGNESIUM: Magnesium: 2.2 mg/dL (ref 1.7–2.4)

## 2019-09-26 LAB — PHOSPHORUS: Phosphorus: 2.8 mg/dL (ref 2.5–4.6)

## 2019-09-26 NOTE — Progress Notes (Signed)
Occupational Therapy Evaluation Patient Details Name: [name] MRN: [id number] DOB: 09-04-1960 Today's Date: 09/26/2019    History of Present Illness [name] is a 59 y.o. female with PMH significant for morbid obesity, alcohol dependence, hypertension and pulmonary embolism, who presents to [facility] for chest pain.   Clinical Impression   PTA pt PLOF living at home with husband in one level home and no use of AE for ADL and IADL engagement. Pt currently S for functional mobility for ADLs due to complaints of dizziness and Mod I for UB and LB dressing. Pt will benefit from additional acute OT to address safety with ADLs, AE education to carry over to home environment, and compensatory techs to maximize independence of ADLs prior to returning home. DC recommendation to home with AE and support required.     Follow Up Recommendations  No OT follow up    Equipment Recommendations  3 in 1 bedside commode (for stability with bathing)    Recommendations for Other Services       Precautions / Restrictions Precautions Precautions: None      Mobility Bed Mobility               General bed mobility comments: not tested, pt received sitting up in chair upon arrival  Transfers Overall transfer level: Needs assistance   Transfers: Sit to/from Stand Sit to Stand: Supervision         General transfer comment: no AE required for toilet transfer. Supervision mostly for safety due to limitations due to complaints of dizziness    Balance Overall balance assessment: Needs assistance Sitting-balance support: Feet supported;No upper extremity supported Sitting balance-Leahy Scale: Good     Standing balance support: No upper extremity supported;During functional activity Standing balance-Leahy Scale: Good                             ADL either performed or assessed with clinical judgement   ADL Overall ADL's : Needs assistance/impaired Eating/Feeding:  Independent;Sitting   Grooming: Wash/dry hands;Wash/dry face;Oral care;Supervision/safety;Standing Grooming Details (indicate cue type and reason): mostly for safety. Upper Body Bathing: Supervision/ safety   Lower Body Bathing: Supervison/ safety   Upper Body Dressing : Modified independent   Lower Body Dressing: Modified independent Lower Body Dressing Details (indicate cue type and reason): compenstory tech of donning slip on shoes. Toilet Transfer: Copy Details (indicate cue type and reason): No AE, supervision for safety due to some report of dizziness, however, no instability or LOC observed Toileting- Clothing Manipulation and Hygiene: Modified independent Toileting - Clothing Manipulation Details (indicate cue type and reason): with increased time.      Functional mobility during ADLs: Supervision/safety       Vision Baseline Vision/History: Wears glasses (readers)       Perception     Praxis      Pertinent Vitals/Pain Pain Assessment: Faces Faces Pain Scale: Hurts a little bit Pain Location: posterior of head  Pain Descriptors / Indicators: Throbbing Pain Intervention(s): Monitored during session     Hand Dominance Right   Extremity/Trunk Assessment Upper Extremity Assessment Upper Extremity Assessment: Overall WFL for tasks assessed   Lower Extremity Assessment Lower Extremity Assessment: Defer to PT evaluation   Cervical / Trunk Assessment Cervical / Trunk Assessment: Normal   Communication Communication Communication: No difficulties   Cognition Arousal/Alertness: Awake/alert Behavior During Therapy: WFL for tasks assessed/performed Overall Cognitive Status: Within Functional Limits for tasks assessed  General Comments  BP 154/99 and 147/93 reported to RN, with O2 sats of 99 on RA    Exercises     Shoulder Instructions      Home Living Family/patient expects  to be discharged to:: Private residence Living Arrangements: Spouse/significant other Available Help at Discharge: Family;Friend(s) Type of Home: House Home Access: Stairs to enter [employer] of Steps: 3   Home Layout: One level     Bathroom Shower/Tub: Tub/shower unit (would prefer to have a shower seat)   Bathroom Toilet: Standard     Home Equipment: Environmental consultant - 2 wheels;Cane - single point   Additional Comments: pt lives at home with husband      Prior Functioning/Environment Level of Independence: Independent        Comments:  still drives and works.        OT Problem List: Decreased activity tolerance;Impaired balance (sitting and/or standing);Decreased safety awareness;Decreased knowledge of use of DME or AE      OT Treatment/Interventions: Self-care/ADL training;Therapeutic exercise;DME and/or AE instruction;Therapeutic activities;Patient/family education;Balance training    OT Goals(Current goals can be found in the care plan section) Acute Rehab OT Goals Patient Stated Goal: to return and back to her routine. OT Goal Formulation: With patient Time For Goal Achievement: 10/10/19 Potential to Achieve Goals: Good  OT Frequency: Min 2X/week   Barriers to D/C:            Co-evaluation              AM-PAC OT "6 Clicks" Daily Activity     Outcome Measure Help from another person eating meals?: None Help from another person taking care of personal grooming?: None Help from another person toileting, which includes using toliet, bedpan, or urinal?: None Help from another person bathing (including washing, rinsing, drying)?: A Little Help from another person to put on and taking off regular upper body clothing?: None Help from another person to put on and taking off regular lower body clothing?: A Little 6 Click Score: 22   End of Session Nurse Communication: Mobility status;Other (comment) (BP values)  Activity Tolerance: Patient tolerated  treatment well Patient left: in chair;with call bell/phone within reach  OT Visit Diagnosis: Unsteadiness on feet (R26.81);Dizziness and giddiness (R42);Pain Pain - Right/Left:  (headache)                Time: 1425-1450 OT Time Calculation (min): 25 min Charges:  OT General Charges $OT Visit: 1 Visit OT Evaluation $OT Eval Low Complexity: 1 Low OT Treatments $Self Care/Home Management : 8-22 mins  Minus Breeding, MSOT, OTR/L  Supplemental Rehabilitation Services  [phone]   Marius Ditch 09/26/2019, 3:13 PM

## 2019-09-26 NOTE — Evaluation (Signed)
Physical Therapy Evaluation Patient Details Name: [name] MRN: [id number] DOB: Mar 07, 1960 Today's Date: 09/26/2019   History of Present Illness  [name] is a 59 y.o. female with PMH significant for morbid obesity, alcohol dependence, hypertension and pulmonary embolism, who presents to [facility] for chest pain.  Clinical Impression  Pt was seen for moblity on RW with cues for safety after initially walking in her room with no AD and HHA.  Pt is better with RW, and has been given permission to walk very short trips with RW on the hallway.  Follow acutely for progression of safety with gait.    Follow Up Recommendations Home health PT;Supervision for mobility/OOB;Supervision/Assistance - 24 hour    Equipment Recommendations  Hospital bed    Recommendations for Other Services Rehab consult     Precautions / Restrictions Precautions Precautions: Fall Restrictions Weight Bearing Restrictions: No      Mobility  Bed Mobility Overal bed mobility: Needs Assistance Bed Mobility: Supine to Sit;Sit to Supine     Supine to sit: Min assist Sit to supine: Min assist      Transfers Overall transfer level: Needs assistance Equipment used: Rolling walker (2 wheeled);1 person hand held assist Transfers: Sit to/from Stand Sit to Stand: Min guard         General transfer comment: min guard for safety of moving  Ambulation/Gait Ambulation/Gait assistance: Min guard Gait Distance (Feet): 110 Feet Assistive device: Rolling walker (2 wheeled) Gait Pattern/deviations: Step-to pattern;Step-through pattern;Decreased stride length;Wide base of support Gait velocity: reduced Gait velocity interpretation: <1.31 ft/sec, indicative of household ambulator General Gait Details: pt is resting when PT arrives and is ready to walk on the hallway  Stairs Stairs: Yes Stairs assistance: Min guard Stair Management: One rail Right Number of Stairs: 4    Wheelchair Mobility     Modified Rankin (Stroke Patients Only)       Balance Overall balance assessment: Needs assistance Sitting-balance support: Feet supported;No upper extremity supported Sitting balance-Leahy Scale: Good     Standing balance support: No upper extremity supported;During functional activity Standing balance-Leahy Scale: Good                               Pertinent Vitals/Pain Pain Assessment: No/denies pain Faces Pain Scale: Hurts a little bit Pain Location: head Pain Intervention(s): Monitored during session;Repositioned    Home Living Family/patient expects to be discharged to:: Private residence Living Arrangements: Spouse/significant other Available Help at Discharge: Family;Friend(s) Type of Home: House Home Access: Stairs to enter Entrance Stairs-Rails: Can reach both Entrance Stairs-Number of Steps: 3 Home Layout: One level Home Equipment: Walker - 2 wheels;Cane - single point      Prior Function Level of Independence: Independent         Comments: was driving and working     Journalist, newspaper   Dominant Hand: Right    Extremity/Trunk Assessment   Upper Extremity Assessment Upper Extremity Assessment: Overall WFL for tasks assessed    Lower Extremity Assessment Lower Extremity Assessment: Generalized weakness    Cervical / Trunk Assessment Cervical / Trunk Assessment: Normal  Communication   Communication: No difficulties  Cognition Arousal/Alertness: Awake/alert Behavior During Therapy: WFL for tasks assessed/performed Overall Cognitive Status: Within Functional Limits for tasks assessed  General Comments General comments (skin integrity, edema, etc.): pt is motivated to work on getting stronger, anxious about her [place], and her follow up plan    Exercises     Assessment/Plan    PT Assessment Patient needs continued PT services  PT Problem List Decreased strength       PT  Treatment Interventions      PT Goals (Current goals can be found in the Care Plan section)  Acute Rehab PT Goals Patient Stated Goal: go home PT Goal Formulation: With patient Time For Goal Achievement: 09/26/19    Frequency Min 3X/week   Barriers to discharge Decreased caregiver support incomplete PPE and low     Co-evaluation               AM-PAC PT "6 Clicks" Mobility  Outcome Measure Help needed turning from your back to your side while in a flat bed without using bedrails?: A Little Help needed moving from lying on your back to sitting on the side of a flat bed without using bedrails?: A Little Help needed moving to and from a bed to a chair (including a wheelchair)?: A Lot Help needed standing up from a chair using your arms (e.g., wheelchair or bedside chair)?: A Lot     6 Click Score: 10    End of Session Equipment Utilized During Treatment: Gait belt Activity Tolerance: Patient tolerated treatment well;Patient limited by fatigue;Patient limited by lethargy;Patient limited by pain Patient left: in bed;with call bell/phone within reach Nurse Communication: Mobility status PT Visit Diagnosis: Unsteadiness on feet (R26.81);Muscle weakness (generalized) (M62.81)    Time: 3664-4034 PT Time Calculation (min) (ACUTE ONLY): 31 min   Charges:   PT Evaluation $PT Eval Moderate Complexity: 1 Mod PT Treatments $Gait Training: 38-52 mins       Ramond Dial 09/26/2019, 9:53 PM   Mee Hives, PT MS Acute Rehab Dept. Number: [place] and [place]

## 2019-09-26 NOTE — Social Work (Signed)
CSW consulted to provide patient with SA resources. CSW met with patient bedside to discuss substance abuse needs and resources. Patient expressed interest in resources and was engaged as CSW went over both inpatient and outpatient treatment options for alcohol use. Patient was thankful and expressed she was already familiar with some of the programs discussed. CSW provided patient insight on the insurance options for some of the programs. Patient took the resources and expressed no additional needs at this time.   Darra Lis, [place] Work

## 2019-09-26 NOTE — Progress Notes (Signed)
PROGRESS NOTE    [name]  ZYS:[id number] DOB: [birth year] DOA: 09/23/2019 PCP: Biagio Borg, MD   Brief Narrative:  HPI per Dr. Collier Bullock on 09/24/19  [name] is a 59 y.o. female with medical history significant for morbid obesity, alcohol dependence, hypertension and pulmonary embolism on Eliquis who presents to the emergency room for evaluation of chest pain which was mostly over the left anterior chest wall, it was nonradiating and she denied having any associated shortness of breath, palpitations or diaphoresis.  She denies having any known aggravating or relieving factors. Patient states that she used to be a weekend drinker but since the onset of the pandemic about a year and half ago she started drinking heavily every day and admits to having symptoms of alcohol withdrawal when she does not drink.  She states that she drinks about 1/5 of vodka daily.  She has not been eating or drinking well due to the alcohol binge and complains of nausea associated with vomiting and diarrhea.  She denies having any abdominal pain. Her last drink was the day prior to coming to the emergency room and she is now tremulous.  She admits to having symptoms of alcohol withdrawal when she does not drink. In the ER she was noted to be hypoglycemic with blood sugar of 68 that improved following food and fluid intake.  She was tachycardic with heart rate in the 120s and had a CIWA score of 7 for which CIWA protocol was initiated and she received a dose of lorazepam. She remains tachycardic with an increase in her CIWA score and so will be hospitalized. Patient is requesting assistance for her alcohol dependence. Labs reveal a sodium of 138, potassium of 4.4, chloride of 97, glucose of 68, BUN of 20, creatinine of 1.08, AST of 70, ALT of 50, troponin of 32, initial lactate of 2.6 >> 1.8 Twelve-lead EKG reviewed by me shows sinus tachycardia    ED Course: Patient is a 59 year old  African-American female who presents to the ER for evaluation of chest pain and nausea.  She admits to heavy alcohol use and while in the ER was noted to be tremulous.  CIWA score was 7 for which patient received Ativan and IV fluids.  She will be admitted to the hospital for alcohol withdrawal.  **Interim History  Patient is still actively withdrawing and having some tremors and hallucinating and today's seizure continues to have some tremors.  Will remain inpatient and continue to treat her with withdrawals with CIWA protocol.  Renal function remains stable from yesterday and her Phos level still low.  LFTs are still abnormal and trending upwards so will obtain a right upper quadrant ultrasound to further evaluate and obtain a acute hepatitis panel in the morning.  Assessment & Plan:   Principal Problem:   Alcohol withdrawal ([place]) Active Problems:   Obesity, Class III, BMI 40-49.9 (morbid obesity) (HCC)   Alcohol abuse   Pulmonary embolism (HCC)   Elevated troponin  Alcohol Abuse with Alcohol Withdrawal -Patient has a history of alcohol abuse and presents to the ER for evaluation of chest pain; she has a family history of alcoholism -She has developed symptoms of alcohol withdrawal and is currently tachycardic and tremulous. -We will place patient on lorazepam and administered for CIWA score of 8 or greater -C/w Clonidine 0.5 mg twice daily -Patient has been counseled on the need to abstain from further alcohol use -Patient is still actively withdrawing and was tremulous and  hallucinating -C/w Supportive Care and resume IVF Hydration with NS at 75 mL/hr -If further clinically decompensates will need Precedex and transferred to the ICU -UDS was Negative  -Drinks 1/5 of Vodka Daily  -Continues to be tremulous and hallucinating  -EtOH Level was 281 on Admission  -Consult SW for Resources and appreciate social work provided resources for the patient  History of Pulmonary  Embolism -Continue Anticoagulation with Apixaban  Chest Pain -Unclear etiology -CXR done and showed "No focal airspace disease. Cardiomegaly." -TSH was normal  -EKG on admission showed Sinus Tachycardia but no real evidence of ST Elevation or Depression on my interpretation.  -Patient has had a slight bump in her troponin levels which may be tachycardia induced; Troponin level went from 28 -> 41 -> 42 -Continue Aspirin 81 mg po Daily Will hold Atorvastatin 20 mg po Daily given abnormal LFTs -Chest Pain had Resolved this AM and she denies any pain -C/w Cardiac Telemetry   HTN -Added Hydralazine -Blood pressure is 141/92 this afternoon -Continue to Monitor closely  Abnormal LFTs Fatty Liver Disease -In the setting of Alcoholism and she does have a history of fatty liver disease -Patient's AST went from 70 -> 81 and has now worsened and AST is now 110 and ALT went from 50 -> 54 and ALT is now 77 -Will Check RUQ U/S and Acute Hepatitis Panel -Continue to Monitor and Trend Hepatic Fxn Panel -Repeat CMP in the AM   Hypophosphatemia -Patient's Phos Level was 1.9 and today is 2.8 -Replete with IV Sodium Phos 20 mmol yesterday -Continue to Monitor and Replete as Necessary -Repeat Phos Level in the AM   Lactic Acidosis -Patient's LA now improved to 1.8 -IV fluid hydration is now stopped -Continue to Monitor  Super Morbidly Obese -Estimated body mass index is 56.37 kg/m as calculated from the following:   Height as of this encounter: 5\' 2"  (1.575 m).   Weight as of this encounter: 035.0 kg.  -Complicates overall prognosis and care -Continued Weight Loss and Dietary Counseling; Lifestyle modification and exercise has been discussed with patient  Erythrocytosis  -Likely in the setting of Hemoconcentration on Admission -Patient's Hgb/Hct went from 15.6/48.9 -> 13.8/43.6 -> 12.9/40.9 -Continue to Monitor and Trend -Repeat CBC in the AM   DVT prophylaxis: Anticoagulated with  Apixaban  Code Status: FULL CODE  Family Communication: No family present at bedside  Disposition Plan: Pending improvement of Alcohol Withdrawal; will work-up for abnormal LFTs with the right upper quadrant ultrasound and acute hepatitis panel  Status is: Inpatient  Remains inpatient appropriate because:Unsafe d/c plan, IV treatments appropriate due to intensity of illness or inability to take PO and Inpatient level of care appropriate due to severity of illness   Dispo: The patient is from: Home              Anticipated d/c is to: Home              Anticipated d/c date is: 2 days              Patient currently is not medically stable to d/c.  Consultants:   None  Procedures: We'll check a right upper quadrant ultrasound  Antimicrobials: Anti-infectives (From admission, onward)   None     Subjective: Seen and examined at bedside and she continues to still have some tremors.  Was hallucinating again yesterday and put a blanket over the duct work in the room.  Was seeing things outside of the window as well.  Concerned about her LFTs trending upwards.  No nausea or vomiting.  No other concerns or complaints at this time.  Objective: Vitals:   09/26/19 0500 09/26/19 0835 09/26/19 0838 09/26/19 1220  BP: (!) 161/102 130/85 130/85 (!) 141/92  Pulse: 93 95 (!) 101 88  Resp: 20  19 18   Temp: 97.8 F (36.6 C)  98.2 F (36.8 C) 98.2 F (36.8 C)  TempSrc: Oral  Oral Oral  SpO2: 95% 96% 97% 97%  Weight: (!) 139.8 kg     Height:        Intake/Output Summary (Last 24 hours) at 09/26/2019 1238 Last data filed at 09/26/2019 0730 Gross per 24 hour  Intake 1793.93 ml  Output --  Net 1793.93 ml   Filed Weights   09/23/19 1504 09/25/19 0500 09/26/19 0500  Weight: (!) 137 kg (!) 137 kg (!) 139.8 kg   Examination: Physical Exam:  Constitutional: WN/WD super morbidly obese African-American female sitting in a chair at bedside and continues to be somewhat tremulous and a little  anxious.  Had been hallucinating yesterday.,  Eyes: Lids and conjunctivae normal, sclerae anicteric  ENMT: External Ears, Nose appear normal. Grossly normal hearing.  Neck: Appears normal, supple, no cervical masses, normal ROM, no appreciable thyromegaly; no JVD Respiratory: Clear to auscultation bilaterally, no wheezing, rales, rhonchi or crackles. Normal respiratory effort and patient is not tachypenic. No accessory muscle use.  Unlabored breathing Cardiovascular: RRR, no murmurs / rubs / gallops. S1 and S2 auscultated.  1+ lower extremity edema.   Abdomen: Soft, non-tender, distended secondary to body habitus. Bowel sounds positive.  GU: Deferred. Musculoskeletal: No clubbing / cyanosis of digits/nails. No joint deformity upper and lower extremities.  Skin: No rashes, lesions, ulcers on limited skin evaluation. No induration; Warm and dry.  Neurologic: CN 2-12 grossly intact with no focal deficits. Romberg sign and cerebellar reflexes not assessed.  Psychiatric: Normal judgment and insight. Alert and oriented x 3.  Anxious mood and appropriate affect.   Data Reviewed: I have personally reviewed following labs and imaging studies  CBC: Recent Labs  Lab 09/23/19 1530 09/25/19 0938 09/26/19 0329  WBC 9.7 8.4 7.7  NEUTROABS  --  6.5 5.6  HGB 15.6* 13.8 12.9  HCT 48.9* 43.6 40.9  MCV 90.2 92.2 93.6  PLT 293 212 389   Basic Metabolic Panel: Recent Labs  Lab 09/23/19 1530 09/24/19 1601 09/25/19 0938 09/26/19 0329  NA 138  --  136 137  K 4.4  --  4.1 3.9  CL 97*  --  100 104  CO2 20*  --  24 23  GLUCOSE 68*  --  244* 138*  BUN 20  --  16 12  CREATININE 1.08*  --  1.06* 0.81  CALCIUM 8.8*  --  8.9 8.4*  MG  --  2.0 2.1 2.2  PHOS  --  1.9* 1.9* 2.8   GFR: Estimated Creatinine Clearance: 102.8 mL/min (by C-G formula based on SCr of 0.81 mg/dL). Liver Function Tests: Recent Labs  Lab 09/23/19 1530 09/25/19 0938 09/26/19 0329  AST 70* 81* 110*  ALT 50* 54* 77*  ALKPHOS  124 93 91  BILITOT 0.9 1.1 0.8  PROT 8.1 7.2 6.2*  ALBUMIN 3.6 3.4* 2.9*   No results for input(s): LIPASE, AMYLASE in the last 168 hours. No results for input(s): AMMONIA in the last 168 hours. Coagulation Profile: No results for input(s): INR, PROTIME in the last 168 hours. Cardiac Enzymes: No results for input(s): CKTOTAL, CKMB, CKMBINDEX,  TROPONINI in the last 168 hours. BNP (last 3 results) No results for input(s): PROBNP in the last 8760 hours. HbA1C: No results for input(s): HGBA1C in the last 72 hours. CBG: Recent Labs  Lab 09/23/19 1456 09/24/19 0431 09/24/19 0540  GLUCAP 69* 83 110*   Lipid Profile: No results for input(s): CHOL, HDL, LDLCALC, TRIG, CHOLHDL, LDLDIRECT in the last 72 hours. Thyroid Function Tests: Recent Labs    09/24/19 1305  TSH 3.824   Anemia Panel: No results for input(s): VITAMINB12, FOLATE, FERRITIN, TIBC, IRON, RETICCTPCT in the last 72 hours. Sepsis Labs: Recent Labs  Lab 09/24/19 0807 09/24/19 1016  LATICACIDVEN 2.6* 1.8    Recent Results (from the past 240 hour(s))  SARS Coronavirus 2 by RT PCR (hospital order, performed in [facility] hospital lab) Nasopharyngeal Nasopharyngeal Swab     Status: None   Collection Time: 09/24/19 10:11 AM   Specimen: Nasopharyngeal Swab  Result Value Ref Range Status   SARS Coronavirus 2 NEGATIVE NEGATIVE Final    Comment: (NOTE) SARS-CoV-2 target nucleic acids are NOT DETECTED.  The SARS-CoV-2 RNA is generally detectable in upper and lower respiratory specimens during the acute phase of infection. The lowest concentration of SARS-CoV-2 viral copies this assay can detect is 250 copies / mL. A negative result does not preclude SARS-CoV-2 infection and should not be used as the sole basis for treatment or other patient management decisions.  A negative result may occur with improper specimen collection / handling, submission of specimen other than nasopharyngeal swab, presence of viral  mutation(s) within the areas targeted by this assay, and inadequate number of viral copies (<250 copies / mL). A negative result must be combined with clinical observations, patient history, and epidemiological information.  Fact Sheet for Patients:   StrictlyIdeas.no  Fact Sheet for Healthcare Providers: BankingDealers.co.za  This test is not yet approved or  cleared by the [place] FDA and has been authorized for detection and/or diagnosis of SARS-CoV-2 by FDA under an Emergency Use Authorization (EUA).  This EUA will remain in effect (meaning this test can be used) for the duration of the COVID-19 declaration under Section 564(b)(1) of the Act, 21 U.S.C. section 360bbb-3(b)(1), unless the authorization is terminated or revoked sooner.  Performed at [facility], [place] [address] [place], [place] [address]      RN Pressure Injury Documentation:     Estimated body mass index is 56.37 kg/m as calculated from the following:   Height as of this encounter: 5\' 2"  (1.575 m).   Weight as of this encounter: 139.8 kg.  Malnutrition Type:      Malnutrition Characteristics:      Nutrition Interventions:     Radiology Studies: No results found. Scheduled Meds: . apixaban  5 mg Oral BID  . aspirin EC  81 mg Oral Daily  . cloNIDine  0.1 mg Oral BID  . famotidine  20 mg Oral Daily  . methocarbamol  500 mg Oral QID  . mometasone-formoterol  2 puff Inhalation BID  . multivitamin with minerals  1 tablet Oral Daily  . polyethylene glycol  17 g Oral BID  . senna-docusate  1 tablet Oral BID  . thiamine  100 mg Oral Daily   Or  . thiamine  100 mg Intravenous Daily   Continuous Infusions:   LOS: 2 days   Kerney Elbe, DO [facility] PAGER is on AMION  If 7PM-7AM, please contact night-coverage [email or web address]

## 2019-09-27 LAB — HEPATITIS PANEL, ACUTE
HCV Ab: NONREACTIVE
Hep A IgM: NONREACTIVE
Hep B C IgM: NONREACTIVE
Hepatitis B Surface Ag: NONREACTIVE

## 2019-09-27 LAB — CBC WITH DIFFERENTIAL/PLATELET
Abs Immature Granulocytes: 0.02 10*3/uL (ref 0.00–0.07)
Basophils Absolute: 0 10*3/uL (ref 0.0–0.1)
Basophils Relative: 0 %
Eosinophils Absolute: 0.2 10*3/uL (ref 0.0–0.5)
Eosinophils Relative: 3 %
HCT: 41.4 % (ref 36.0–46.0)
Hemoglobin: 12.9 g/dL (ref 12.0–15.0)
Immature Granulocytes: 0 %
Lymphocytes Relative: 20 %
Lymphs Abs: 1.6 10*3/uL (ref 0.7–4.0)
MCH: 29.2 pg (ref 26.0–34.0)
MCHC: 31.2 g/dL (ref 30.0–36.0)
MCV: 93.7 fL (ref 80.0–100.0)
Monocytes Absolute: 0.6 10*3/uL (ref 0.1–1.0)
Monocytes Relative: 8 %
Neutro Abs: 5.6 10*3/uL (ref 1.7–7.7)
Neutrophils Relative %: 69 %
Platelets: 156 10*3/uL (ref 150–400)
RBC: 4.42 MIL/uL (ref 3.87–5.11)
RDW: 13.4 % (ref 11.5–15.5)
WBC: 8.1 10*3/uL (ref 4.0–10.5)
nRBC: 0 % (ref 0.0–0.2)

## 2019-09-27 LAB — COMPREHENSIVE METABOLIC PANEL
ALT: 110 U/L — ABNORMAL HIGH (ref 0–44)
AST: 128 U/L — ABNORMAL HIGH (ref 15–41)
Albumin: 2.8 g/dL — ABNORMAL LOW (ref 3.5–5.0)
Alkaline Phosphatase: 87 U/L (ref 38–126)
Anion gap: 8 (ref 5–15)
BUN: 13 mg/dL (ref 6–20)
CO2: 25 mmol/L (ref 22–32)
Calcium: 8.9 mg/dL (ref 8.9–10.3)
Chloride: 106 mmol/L (ref 98–111)
Creatinine, Ser: 0.86 mg/dL (ref 0.44–1.00)
GFR calc Af Amer: >60 mL/min (ref >60)
GFR calc non Af Amer: >60 mL/min (ref >60)
Glucose, Bld: 129 mg/dL — ABNORMAL HIGH (ref 70–99)
Potassium: 3.7 mmol/L (ref 3.5–5.1)
Sodium: 139 mmol/L (ref 135–145)
Total Bilirubin: 0.6 mg/dL (ref 0.3–1.2)
Total Protein: 6 g/dL — ABNORMAL LOW (ref 6.5–8.1)

## 2019-09-27 LAB — MAGNESIUM: Magnesium: 2 mg/dL (ref 1.7–2.4)

## 2019-09-27 LAB — PHOSPHORUS: Phosphorus: 3.3 mg/dL (ref 2.5–4.6)

## 2019-09-27 MED ORDER — HYDROXYZINE HCL 25 MG PO TABS: 25.0000 mg | ORAL_TABLET | Freq: Three times a day (TID) | ORAL | Status: DC | PRN

## 2019-09-27 MED ORDER — LORAZEPAM 1 MG PO TABS
1.0000 mg | ORAL_TABLET | Freq: Once | ORAL | Status: AC
  Administered 2019-09-27: 1 mg via ORAL
  Filled 2019-09-27: qty 1

## 2019-09-27 NOTE — Progress Notes (Signed)
PROGRESS NOTE    [name]  ZOX:[id number] DOB: 03/04/1960 DOA: 09/23/2019 PCP: Susan Borg, MD   Brief Narrative:  HPI per Dr. Collier [name] on 09/24/19  [name] is a 59 y.o. female with medical history significant for morbid obesity, alcohol dependence, hypertension and pulmonary embolism on Eliquis who presents to the emergency room for evaluation of chest pain which was mostly over the left anterior chest wall, it was nonradiating and she denied having any associated shortness of breath, palpitations or diaphoresis.  She denies having any known aggravating or relieving factors. Patient states that she used to be a weekend drinker but since the onset of the pandemic about a year and half ago she started drinking heavily every day and admits to having symptoms of alcohol withdrawal when she does not drink.  She states that she drinks about 1/5 of vodka daily.  She has not been eating or drinking well due to the alcohol binge and complains of nausea associated with vomiting and diarrhea.  She denies having any abdominal pain. Her last drink was the day prior to coming to the emergency room and she is now tremulous.  She admits to having symptoms of alcohol withdrawal when she does not drink. In the ER she was noted to be hypoglycemic with blood sugar of 68 that improved following food and fluid intake.  She was tachycardic with heart rate in the 120s and had a CIWA score of 7 for which CIWA protocol was initiated and she received a dose of lorazepam. She remains tachycardic with an increase in her CIWA score and so will be hospitalized. Patient is requesting assistance for her alcohol dependence. Labs reveal a sodium of 138, potassium of 4.4, chloride of 97, glucose of 68, BUN of 20, creatinine of 1.08, AST of 70, ALT of 50, troponin of 32, initial lactate of 2.6 >> 1.8 Twelve-lead EKG reviewed by me shows sinus tachycardia    ED Course: Patient is a 59 year old  African-American female who presents to the ER for evaluation of chest pain and nausea.  She admits to heavy alcohol use and while in the ER was noted to be tremulous.  CIWA score was 7 for which patient received Ativan and IV fluids.  She will be admitted to the hospital for alcohol withdrawal.  **Interim History  Patient was still actively withdrawing and having some tremors and hallucinating and today she continues to have some tremors.  Will remain inpatient and continue to treat her with withdrawals with CIWA protocol but this is improving.  Renal function remains stable from yesterday and her Phos level still low.  LFTs are still abnormal and trending upwards so will obtain a right upper quadrant ultrasound to further evaluate and obtain a acute hepatitis panel.  Acute hepatitis panel was negative and LFTs continue to worsen.  Will need to continue to monitor and trend and if continues to elevate we will consult her primary gastroenterologist Dr. Benson [name] in the morning.  Assessment & Plan:   Principal Problem:   Alcohol withdrawal ([place]) Active Problems:   Obesity, Class III, BMI 40-49.9 (morbid obesity) (HCC)   Alcohol abuse   Pulmonary embolism (HCC)   Elevated troponin  Alcohol Abuse with Alcohol Withdrawal -Patient has a history of alcohol abuse and presents to the ER for evaluation of chest pain; she has a family history of alcoholism -She has developed symptoms of alcohol withdrawal and is currently tachycardic and tremulous. -We will place patient on lorazepam  and administered for CIWA score of 8 or greater -C/w Clonidine 0.5 mg twice daily -Patient has been counseled on the need to abstain from further alcohol use -Patient is still actively withdrawing and was tremulous and hallucinating -C/w Supportive Care and resume IVF Hydration with NS at 75 mL/hr -If further clinically decompensates will need Precedex and transferred to the ICU -UDS was Negative  -Drinks 1/5 of Vodka Daily    -Continues to be tremulous and hallucinating  -EtOH Level was 281 on Admission  -Consult SW for Resources and appreciate social work provided resources for the patient -her withdrawal symptoms are improving  History of Pulmonary Embolism -Continue Anticoagulation with Apixaban  Chest Pain -Unclear etiology -CXR done and showed "No focal airspace disease. Cardiomegaly." -TSH was normal  -EKG on admission showed Sinus Tachycardia but no real evidence of ST Elevation or Depression on my interpretation.  -Patient has had a slight bump in her troponin levels which may be tachycardia induced; Troponin level went from 28 -> 41 -> 42 -Continue Aspirin 81 mg po Daily Will hold Atorvastatin 20 mg po Daily given abnormal LFTs -Chest Pain had Resolved this AM and she denies any pain -C/w Cardiac Telemetry   HTN -Added Hydralazine -Blood pressure is 141/92 this afternoon -Continue to Monitor closely  Abnormal LFTs, worsening Fatty Liver Disease -In the setting of Alcoholism and she does have a history of fatty liver disease -Patient's AST went from 70 -> 81 and has now worsened and AST is now 110 -> 128 and ALT went from 50 -> 54 and ALT is now 77 -> 110 -Will Check RUQ U/S and Acute Hepatitis Panel -acute hepatitis panel was negative -Right upper quadrant ultrasound showed "Stable hepatic steatosis. No acute process." -Continue to Monitor and Trend Hepatic Fxn Panel -Repeat CMP in the AM and if continues to worsen will consult gastroenterology for further evaluation recommendations  Hypophosphatemia -Patient's Phos Level is now 3.3 -Replete with IV Sodium Phos 20 mmol yesterday -Continue to Monitor and Replete as Necessary -Repeat Phos Level in the AM   Lactic Acidosis -Patient's LA now improved to 2.0 -IV fluid hydration is now stopped -Continue to Monitor  Super Morbidly Obese -Estimated body mass index is 56.37 kg/m as calculated from the following:   Height as of this  encounter: 5\' 2"  (1.575 m).   Weight as of this encounter: 751.0 kg.  -Complicates overall prognosis and care -Continued Weight Loss and Dietary Counseling; Lifestyle modification and exercise has been discussed with patient  Erythrocytosis  -Likely in the setting of Hemoconcentration on Admission -Patient's Hgb/Hct went from 15.6/48.9 -> 13.8/43.6 -> 12.9/40.9 -> 12.9/41.4 -Continue to Monitor and Trend -Repeat CBC in the AM   DVT prophylaxis: Anticoagulated with Apixaban  Code Status: FULL CODE  Family Communication: No family present at bedside  Disposition Plan: Pending improvement of Alcohol Withdrawal; will work-up for abnormal LFTs with the right upper quadrant ultrasound and acute hepatitis panel  Status is: Inpatient  Remains inpatient appropriate because:Unsafe d/c plan, IV treatments appropriate due to intensity of illness or inability to take PO and Inpatient level of care appropriate due to severity of illness   Dispo: The patient is from: Home              Anticipated d/c is to: Home              Anticipated d/c date is: 1 day              Patient currently  is not medically stable to d/c.  Consultants:   None  Procedures: We'll check a right upper quadrant ultrasound  Antimicrobials: Anti-infectives (From admission, onward)   None     Subjective: Seen and examined at bedside and her tremors are improving now.  Denies any chest pain, lightheadedness or dizziness.  Worried about her liver function still trending upwards.  No other concerns or plans at this time.  Objective: Vitals:   09/26/19 2109 09/27/19 0748 09/27/19 1400 09/27/19 1424  BP:  (!) 166/104  135/86  Pulse:  98 94 95  Resp:  20  18  Temp:  98.4 F (36.9 C)  98.2 F (36.8 C)  TempSrc:  Oral  Oral  SpO2: 97%   99%  Weight:      Height:        Intake/Output Summary (Last 24 hours) at 09/27/2019 1607 Last data filed at 09/27/2019 1300 Gross per 24 hour  Intake 720 ml  Output --  Net 720  ml   Filed Weights   09/23/19 1504 09/25/19 0500 09/26/19 0500  Weight: (!) 137 kg (!) 137 kg (!) 139.8 kg   Examination: Physical Exam:  Constitutional: WN/WD super morbidly obese African-American female currently sitting in the chair at bedside who is improved and not as tremulous. Eyes: Lids and conjunctivae normal, sclerae anicteric  ENMT: External Ears, Nose appear normal. Grossly normal hearing.  Neck: Appears normal, supple, no cervical masses, normal ROM, no appreciable thyromegaly; difficult to assess JVD given her body habitus Respiratory: Diminished to auscultation bilaterally, no wheezing, rales, rhonchi or crackles. Normal respiratory effort and patient is not tachypenic. No accessory muscle use.  Unlabored breathing Cardiovascular: RRR, no murmurs / rubs / gallops. S1 and S2 auscultated.  1+ extremity edema.  Abdomen: Soft, non-tender, distended secondary to body habitus. Bowel sounds positive.  GU: Deferred. Musculoskeletal: No clubbing / cyanosis of digits/nails. No joint deformity upper and lower extremities.  Skin: No rashes, lesions, ulcers on limited skin evaluation. No induration; Warm and dry.  Neurologic: CN 2-12 grossly intact with no focal deficits. Romberg sign and cerebellar reflexes not assessed.  Psychiatric: Normal judgment and insight. Alert and oriented x 3. Normal mood and appropriate affect.   Data Reviewed: I have personally reviewed following labs and imaging studies  CBC: Recent Labs  Lab 09/23/19 1530 09/25/19 0938 09/26/19 0329 09/27/19 0342  WBC 9.7 8.4 7.7 8.1  NEUTROABS  --  6.5 5.6 5.6  HGB 15.6* 13.8 12.9 12.9  HCT 48.9* 43.6 40.9 41.4  MCV 90.2 92.2 93.6 93.7  PLT 293 212 157 169   Basic Metabolic Panel: Recent Labs  Lab 09/23/19 1530 09/24/19 1601 09/25/19 0938 09/26/19 0329 09/27/19 0342  NA 138  --  136 137 139  K 4.4  --  4.1 3.9 3.7  CL 97*  --  100 104 106  CO2 20*  --  24 23 25   GLUCOSE 68*  --  244* 138* 129*  BUN  20  --  16 12 13   CREATININE 1.08*  --  1.06* 0.81 0.86  CALCIUM 8.8*  --  8.9 8.4* 8.9  MG  --  2.0 2.1 2.2 2.0  PHOS  --  1.9* 1.9* 2.8 3.3   GFR: Estimated Creatinine Clearance: 96.8 mL/min (by C-G formula based on SCr of 0.86 mg/dL). Liver Function Tests: Recent Labs  Lab 09/23/19 1530 09/25/19 0938 09/26/19 0329 09/27/19 0342  AST 70* 81* 110* 128*  ALT 50* 54* 77* 110*  ALKPHOS  124 93 91 87  BILITOT 0.9 1.1 0.8 0.6  PROT 8.1 7.2 6.2* 6.0*  ALBUMIN 3.6 3.4* 2.9* 2.8*   No results for input(s): LIPASE, AMYLASE in the last 168 hours. No results for input(s): AMMONIA in the last 168 hours. Coagulation Profile: No results for input(s): INR, PROTIME in the last 168 hours. Cardiac Enzymes: No results for input(s): CKTOTAL, CKMB, CKMBINDEX, TROPONINI in the last 168 hours. BNP (last 3 results) No results for input(s): PROBNP in the last 8760 hours. HbA1C: No results for input(s): HGBA1C in the last 72 hours. CBG: Recent Labs  Lab 09/23/19 1456 09/24/19 0431 09/24/19 0540  GLUCAP 69* 83 110*   Lipid Profile: No results for input(s): CHOL, HDL, LDLCALC, TRIG, CHOLHDL, LDLDIRECT in the last 72 hours. Thyroid Function Tests: No results for input(s): TSH, T4TOTAL, FREET4, T3FREE, THYROIDAB in the last 72 hours. Anemia Panel: No results for input(s): VITAMINB12, FOLATE, FERRITIN, TIBC, IRON, RETICCTPCT in the last 72 hours. Sepsis Labs: Recent Labs  Lab 09/24/19 0807 09/24/19 1016  LATICACIDVEN 2.6* 1.8    Recent Results (from the past 240 hour(s))  SARS Coronavirus 2 by RT PCR (hospital order, performed in [facility] hospital lab) Nasopharyngeal Nasopharyngeal Swab     Status: None   Collection Time: 09/24/19 10:11 AM   Specimen: Nasopharyngeal Swab  Result Value Ref Range Status   SARS Coronavirus 2 NEGATIVE NEGATIVE Final    Comment: (NOTE) SARS-CoV-2 target nucleic acids are NOT DETECTED.  The SARS-CoV-2 RNA is generally detectable in upper and  lower respiratory specimens during the acute phase of infection. The lowest concentration of SARS-CoV-2 viral copies this assay can detect is 250 copies / mL. A negative result does not preclude SARS-CoV-2 infection and should not be used as the sole basis for treatment or other patient management decisions.  A negative result may occur with improper specimen collection / handling, submission of specimen other than nasopharyngeal swab, presence of viral mutation(s) within the areas targeted by this assay, and inadequate number of viral copies (<250 copies / mL). A negative result must be combined with clinical observations, patient history, and epidemiological information.  Fact Sheet for Patients:   StrictlyIdeas.no  Fact Sheet for Healthcare Providers: BankingDealers.co.za  This test is not yet approved or  cleared by the [place] FDA and has been authorized for detection and/or diagnosis of SARS-CoV-2 by FDA under an Emergency Use Authorization (EUA).  This EUA will remain in effect (meaning this test can be used) for the duration of the COVID-19 declaration under Section 564(b)(1) of the Act, 21 U.S.C. section 360bbb-3(b)(1), unless the authorization is terminated or revoked sooner.  Performed at [facility], [address] [place], [place] [address]      RN Pressure Injury Documentation:     Estimated body mass index is 56.37 kg/m as calculated from the following:   Height as of this encounter: 5\' 2"  (1.575 m).   Weight as of this encounter: 139.8 kg.  Malnutrition Type:      Malnutrition Characteristics:      Nutrition Interventions:     Radiology Studies: US Abdomen Limited RUQ  Result Date: 09/26/2019 CLINICAL DATA:  Abnormal liver function tests, hypertension EXAM: ULTRASOUND ABDOMEN LIMITED RIGHT UPPER QUADRANT COMPARISON:  07/06/2018 FINDINGS: Gallbladder: No gallstones or wall thickening  visualized. No sonographic Murphy sign noted by sonographer. Common bile duct: Diameter: 6 mm Liver: Diffuse increased liver echotexture consistent with hepatic steatosis. No focal liver abnormality. Portal vein is patent  on color Doppler imaging with normal direction of blood flow towards the liver. Other: None. IMPRESSION: 1. Stable hepatic steatosis. 2. No acute process. Electronically Signed   By: Randa Ngo M.D.   On: 09/26/2019 19:53   Scheduled Meds: . apixaban  5 mg Oral BID  . aspirin EC  81 mg Oral Daily  . cloNIDine  0.1 mg Oral BID  . famotidine  20 mg Oral Daily  . methocarbamol  500 mg Oral QID  . mometasone-formoterol  2 puff Inhalation BID  . multivitamin with minerals  1 tablet Oral Daily  . polyethylene glycol  17 g Oral BID  . senna-docusate  1 tablet Oral BID  . thiamine  100 mg Oral Daily   Or  . thiamine  100 mg Intravenous Daily   Continuous Infusions:   LOS: 3 days   Kerney Elbe, DO [facility] PAGER is on AMION  If 7PM-7AM, please contact night-coverage [email or web address]

## 2019-09-27 NOTE — Progress Notes (Signed)
Physical Therapy Treatment Patient Details Name: [name] MRN: [id number] DOB: September 05, 1960 Today's Date: 09/27/2019    History of Present Illness [name] is a 59 y.o. female with PMH significant for morbid obesity, alcohol dependence, hypertension and pulmonary embolism, who presents to [facility] for chest pain.    PT Comments    Pt is getting up to walk with assist, noted her pulses to be higher with effort up to 124, down to 97 after resting.  No pain was experienced, and pt is minimally SOB.  Minimized talk and pt could take two standing rest breaks and succeed with gait.   Follow Up Recommendations  Home health PT;Supervision for mobility/OOB;Supervision/Assistance - 24 hour     Equipment Recommendations  Hospital bed    Recommendations for Other Services Rehab consult     Precautions / Restrictions Precautions Precautions: Fall Restrictions Weight Bearing Restrictions: No    Mobility  Bed Mobility               General bed mobility comments: on side of bed when PT arrived  Transfers Overall transfer level: Needs assistance Equipment used: Rolling walker (2 wheeled);1 person hand held assist Transfers: Sit to/from Stand Sit to Stand: Modified independent (Device/Increase time)         General transfer comment: Pt. is not using rw today and is able to amb without lob  Ambulation/Gait Ambulation/Gait assistance: Min guard Gait Distance (Feet): 200 Feet Assistive device: Rolling walker (2 wheeled) Gait Pattern/deviations: Step-to pattern;Step-through pattern;Decreased stride length;Wide base of support Gait velocity: reduced Gait velocity interpretation: <1.31 ft/sec, indicative of household ambulator     Marine scientist Rankin (Stroke Patients Only)       Balance     Sitting balance-Leahy Scale: Good       Standing balance-Leahy Scale: Good                               Cognition Arousal/Alertness: Awake/alert Behavior During Therapy: WFL for tasks assessed/performed Overall Cognitive Status: Within Functional Limits for tasks assessed                                        Exercises      General Comments General comments (skin integrity, edema, etc.): motivated to walk and has already climbed stairs      Pertinent Vitals/Pain Pain Assessment: 0-10 Pain Score: 4  Pain Location: neck Pain Descriptors / Indicators: Guarding;Grimacing Pain Intervention(s): Monitored during session    Home Living                      Prior Function            PT Goals (current goals can now be found in the care plan section) Acute Rehab PT Goals Patient Stated Goal: go home PT Goal Formulation: With patient Time For Goal Achievement: 10/11/19 Progress towards PT goals: Progressing toward goals    Frequency    Min 3X/week      PT Plan Current plan remains appropriate    Co-evaluation              AM-PAC PT "6 Clicks" Mobility   Outcome Measure  Help needed turning from your back to your side  while in a flat bed without using bedrails?: A Little Help needed moving from lying on your back to sitting on the side of a flat bed without using bedrails?: A Little Help needed moving to and from a bed to a chair (including a wheelchair)?: A Little Help needed standing up from a chair using your arms (e.g., wheelchair or bedside chair)?: A Little Help needed to walk in hospital room?: A Little Help needed climbing 3-5 steps with a railing? : A Little 6 Click Score: 18    End of Session Equipment Utilized During Treatment: Gait belt Activity Tolerance: Patient tolerated treatment well;Patient limited by fatigue;Patient limited by lethargy;Patient limited by pain Patient left: in bed;with call bell/phone within reach Nurse Communication: Mobility status PT Visit Diagnosis: Unsteadiness on feet (R26.81);Muscle weakness  (generalized) (M62.81)     Time: 8144-8185 PT Time Calculation (min) (ACUTE ONLY): 18 min  Charges:  $Gait Training: 8-22 mins                  Ramond Dial 09/27/2019, 10:37 PM  Mee Hives, PT MS Acute Rehab Dept. Number: [place] and [place]

## 2019-09-27 NOTE — Plan of Care (Signed)

## 2019-09-27 NOTE — Progress Notes (Signed)
Occupational Therapy Treatment Patient Details Name: [name] MRN: [id number] DOB: 08-16-1960 Today's Date: 09/27/2019    History of present illness [name] is a 59 y.o. female with PMH significant for morbid obesity, alcohol dependence, hypertension and pulmonary embolism, who presents to [facility] for chest pain.   OT comments  Pt. Has met all goals. Pt. Is Mod I with ADLs and mobility. Pt. States she feels confident about going home. Pt. Ed on tub transfer bench and installing grab bars in shower and next to commode. Gave contact information about [employer] to install. Pt. Ed on use of heavy duty reacher at home to increase I with IADLs.   Follow Up Recommendations       Equipment Recommendations  Tub/shower bench    Recommendations for Other Services      Precautions / Restrictions Precautions Precautions: Fall Restrictions Weight Bearing Restrictions: No       Mobility Bed Mobility                  Transfers Overall transfer level: Needs assistance   Transfers: Sit to/from Stand Sit to Stand: Modified independent (Device/Increase time)         General transfer comment: Pt. is not using rw today and is able to amb without lob    Balance     Sitting balance-Leahy Scale: Good       Standing balance-Leahy Scale: Good                             ADL either performed or assessed with clinical judgement   ADL Overall ADL's : Needs assistance/impaired Eating/Feeding: Independent;Sitting   Grooming: Wash/dry hands;Wash/dry face;Oral care;Modified independent   Upper Body Bathing: Modified independent;Sitting   Lower Body Bathing: Modified independent;Sit to/from stand   Upper Body Dressing : Modified independent   Lower Body Dressing: Modified independent   Toilet Transfer: Modified Independent       Tub/ Shower Transfer: Modified independent Clinical cytogeneticist Details (indicate cue type and reason): simulated stepping into  tub         Vision       Perception     Praxis      Cognition Arousal/Alertness: Awake/alert Behavior During Therapy: WFL for tasks assessed/performed Overall Cognitive Status: Within Functional Limits for tasks assessed                                          Exercises     Shoulder Instructions       General Comments      Pertinent Vitals/ Pain       Pain Assessment: 0-10 Pain Score: 6  Pain Location: neck Pain Intervention(s): Monitored during session;Premedicated before session  Home Living                                          Prior Functioning/Environment              Frequency           Progress Toward Goals  OT Goals(current goals can now be found in the care plan section)  Progress towards OT goals: Goals met/education completed, patient discharged from OT  Acute Rehab OT Goals Patient Stated Goal: go home OT  Goal Formulation: With patient Time For Goal Achievement: 10/10/19 Potential to Achieve Goals: Good ADL Goals Pt Will Transfer to Toilet: ambulating;with modified independence Pt Will Perform Tub/Shower Transfer: Tub transfer;with modified independence;3 in 1;ambulating;rolling walker  Plan All goals met and education completed, patient discharged from OT services    Co-evaluation                 AM-PAC OT "6 Clicks" Daily Activity     Outcome Measure   Help from another person eating meals?: None Help from another person taking care of personal grooming?: None Help from another person toileting, which includes using toliet, bedpan, or urinal?: None Help from another person bathing (including washing, rinsing, drying)?: None Help from another person to put on and taking off regular upper body clothing?: None Help from another person to put on and taking off regular lower body clothing?: None 6 Click Score: 24    End of Session        Activity Tolerance Patient tolerated  treatment well   Patient Left in chair;with call bell/phone within reach   Nurse Communication          Time: 1451-1521 OT Time Calculation (min): 30 min  Charges: OT General Charges $OT Visit: 1 Visit OT Treatments $Self Care/Home Management : 23-37 mins  Reece Packer OT/L   Kaveon Blatz 09/27/2019, 3:27 PM

## 2019-09-28 ENCOUNTER — Encounter (HOSPITAL_COMMUNITY): Payer: Self-pay | Admitting: Internal Medicine

## 2019-09-28 DIAGNOSIS — I2782 Chronic pulmonary embolism: Secondary | ICD-10-CM

## 2019-09-28 DIAGNOSIS — R778 Other specified abnormalities of plasma proteins: Secondary | ICD-10-CM

## 2019-09-28 DIAGNOSIS — F1010 Alcohol abuse, uncomplicated: Secondary | ICD-10-CM

## 2019-09-28 DIAGNOSIS — F10230 Alcohol dependence with withdrawal, uncomplicated: Secondary | ICD-10-CM

## 2019-09-28 LAB — COMPREHENSIVE METABOLIC PANEL
ALT: 152 U/L — ABNORMAL HIGH (ref 0–44)
AST: 140 U/L — ABNORMAL HIGH (ref 15–41)
Albumin: 3 g/dL — ABNORMAL LOW (ref 3.5–5.0)
Alkaline Phosphatase: 95 U/L (ref 38–126)
Anion gap: 8 (ref 5–15)
BUN: 14 mg/dL (ref 6–20)
CO2: 24 mmol/L (ref 22–32)
Calcium: 9 mg/dL (ref 8.9–10.3)
Chloride: 106 mmol/L (ref 98–111)
Creatinine, Ser: 0.89 mg/dL (ref 0.44–1.00)
GFR calc Af Amer: >60 mL/min (ref >60)
GFR calc non Af Amer: >60 mL/min (ref >60)
Glucose, Bld: 171 mg/dL — ABNORMAL HIGH (ref 70–99)
Potassium: 3.9 mmol/L (ref 3.5–5.1)
Sodium: 138 mmol/L (ref 135–145)
Total Bilirubin: 0.4 mg/dL (ref 0.3–1.2)
Total Protein: 6.4 g/dL — ABNORMAL LOW (ref 6.5–8.1)

## 2019-09-28 LAB — POTASSIUM: Potassium: 3.9 mmol/L (ref 3.5–5.1)

## 2019-09-28 LAB — MAGNESIUM: Magnesium: 1.8 mg/dL (ref 1.7–2.4)

## 2019-09-28 MED ORDER — LORAZEPAM 0.5 MG PO TABS
0.5000 mg | ORAL_TABLET | Freq: Two times a day (BID) | ORAL | Status: DC | PRN
  Administered 2019-09-28 (×2): 0.5 mg via ORAL
  Filled 2019-09-28 (×2): qty 1

## 2019-09-28 NOTE — Progress Notes (Signed)
PROGRESS NOTE    [name]  UJW:[id number] DOB: [birth year] DOA: 09/23/2019 PCP: Biagio Borg, MD   Brief Narrative:  HPI on 09/24/2019 by Dr. Royce Macadamia Agbata [name] is a 59 y.o. female with medical history significant for morbid obesity, alcohol dependence, hypertension and pulmonary embolism on Eliquis who presents to the emergency room for evaluation of chest pain which was mostly over the left anterior chest wall, it was nonradiating and she denied having any associated shortness of breath, palpitations or diaphoresis.  She denies having any known aggravating or relieving factors. Patient states that she used to be a weekend drinker but since the onset of the pandemic about a year and half ago she started drinking heavily every day and admits to having symptoms of alcohol withdrawal when she does not drink.  She states that she drinks about 1/5 of vodka daily.  She has not been eating or drinking well due to the alcohol binge and complains of nausea associated with vomiting and diarrhea.  She denies having any abdominal pain. Her last drink was the day prior to coming to the emergency room and she is now tremulous.  She admits to having symptoms of alcohol withdrawal when she does not drink. In the ER she was noted to be hypoglycemic with blood sugar of 68 that improved following food and fluid intake.  She was tachycardic with heart rate in the 120s and had a CIWA score of 7 for which CIWA protocol was initiated and she received a dose of lorazepam. She remains tachycardic with an increase in her CIWA score and so will be hospitalized. Patient is requesting assistance for her alcohol dependence. Labs reveal a sodium of 138, potassium of 4.4, chloride of 97, glucose of 68, BUN of 20, creatinine of 1.08, AST of 70, ALT of 50, troponin of 32, initial lactate of 2.6 >> 1.8 Twelve-lead EKG reviewed by me shows sinus tachycardia  Interim history Patient was admitted with alcohol  withdrawal and was noted to have some tremors as well as hallucinations.  Placed on CIWA protocol.  Renal function is stable.  LFTs are trending upward.  Right upper quadrant ultrasound was unremarkable for acute issues, did show stable hepatic steatosis.  Discussed with gastroenterology, Dr. Benson Norway, recommended monitoring for an additional day.  Assessment & Plan   Alcohol abuse with alcohol withdrawal -Patient presented with alcohol abuse when she presented to the ED for chest pain -Admitted to drinking 1/5 of vodka per day -Per previous documentation, patient did have active withdrawal with tremors, tachycardia and hallucinations, which appeared to have resolved -Continue clonidine -Continue CIWA protocol -Urine drug screen unremarkable -EtOH level on admission 281 -social work consulted for resources  History of pulmonary embolism -Continue Eliquis  Chest pain -Denies current chest pain -Etiology unknown -Chest x-ray showed no focal airspace disease. -EKG on admission did show sinus tachycardia but no evidence of ST elevation or depression -Patient did have slight elevated and her troponins peak of 42 -Continue aspirin -Lipitor held due to elevated LFTs  Essential hypertension -Continue hydralazine, clonidine  Abnormal LFTs -LFTs have been worsening every day, AST now up to 140, ALT 152 -Right upper quadrant ultrasound showed stable hepatic steatosis, no acute process -Acute hepatitis panel unremarkable -Discussed with Dr. Benson Norway, strength neurology, who recommended following and trending LFTs for an additional day.  If continue to rise by 8/19, he will officially consult  Hypophosphatemia -resolved with supplementation  Lactic acidosis -Improved, suspect secondary to the above  Morbid obesity -BMI 57.69 -Patient needs to follow-up with PCP to discuss lifestyle modification  Erythrocytosis  -Duration, has not resolved  DVT Prophylaxis  Eliquis  Code Status:  Full  Family Communication: None at bedside  Disposition Plan:  Status is: Inpatient  Remains inpatient appropriate because:Inpatient level of care appropriate due to severity of illness, continues to have elevated LFTs.   Dispo: The patient is from: Home              Anticipated d/c is to: Home              Anticipated d/c date is: 2 days              Patient currently is not medically stable to d/c.   Consultants Gastroenterology, Dr. Benson Norway, via phone  Procedures  RUQ ultrasound  Antibiotics   Anti-infectives (From admission, onward)   None      Subjective:   [name] seen and examined today.  Patient very concerned and anxious this morning especially about her elevated liver enzymes.  Wonders if she has cirrhosis.  Currently denies chest pain, shortness of breath, abdominal pain, nausea or vomiting, dizziness or headache.  Objective:   Vitals:   09/27/19 2057 09/28/19 0547 09/28/19 0829 09/28/19 1023  BP:  (!) 156/107  (!) 151/97  Pulse:  93    Resp:  18    Temp:  97.8 F (36.6 C)    TempSrc:  Oral    SpO2: 96% 98% 97%   Weight:  (!) 143.1 kg    Height:        Intake/Output Summary (Last 24 hours) at 09/28/2019 1102 Last data filed at 09/27/2019 1800 Gross per 24 hour  Intake 480 ml  Output --  Net 480 ml   Filed Weights   09/25/19 0500 09/26/19 0500 09/28/19 0547  Weight: (!) 137 kg (!) 139.8 kg (!) 143.1 kg    Exam  General: Well developed, well nourished, NAD, appears stated age  49: NCAT, mucous membranes moist.   Cardiovascular: S1 S2 auscultated, RRR  Respiratory: Clear to auscultation bilaterally   Abdomen: Soft, obese, nontender, nondistended, + bowel sounds  Extremities: warm dry without cyanosis clubbing. Trace LE edema B/L   Neuro: AAOx3, nonfocal  Psych: shows however appropriate   Data Reviewed: I have personally reviewed following labs and imaging studies  CBC: Recent Labs  Lab 09/23/19 1530 09/25/19 0938  09/26/19 0329 09/27/19 0342  WBC 9.7 8.4 7.7 8.1  NEUTROABS  --  6.5 5.6 5.6  HGB 15.6* 13.8 12.9 12.9  HCT 48.9* 43.6 40.9 41.4  MCV 90.2 92.2 93.6 93.7  PLT 293 212 157 409   Basic Metabolic Panel: Recent Labs  Lab 09/23/19 1530 09/24/19 1601 09/25/19 0938 09/26/19 0329 09/27/19 0342 09/28/19 0836  NA 138  --  136 137 139 138  K 4.4  --  4.1 3.9 3.7 3.9  CL 97*  --  100 104 106 106  CO2 20*  --  24 23 25 24   GLUCOSE 68*  --  244* 138* 129* 171*  BUN 20  --  16 12 13 14   CREATININE 1.08*  --  1.06* 0.81 0.86 0.89  CALCIUM 8.8*  --  8.9 8.4* 8.9 9.0  MG  --  2.0 2.1 2.2 2.0  --   PHOS  --  1.9* 1.9* 2.8 3.3  --    GFR: Estimated Creatinine Clearance: 95 mL/min (by C-G formula based on SCr of 0.89 mg/dL).  Liver Function Tests: Recent Labs  Lab 09/23/19 1530 09/25/19 0938 09/26/19 0329 09/27/19 0342 09/28/19 0836  AST 70* 81* 110* 128* 140*  ALT 50* 54* 77* 110* 152*  ALKPHOS 124 93 91 87 95  BILITOT 0.9 1.1 0.8 0.6 0.4  PROT 8.1 7.2 6.2* 6.0* 6.4*  ALBUMIN 3.6 3.4* 2.9* 2.8* 3.0*   No results for input(s): LIPASE, AMYLASE in the last 168 hours. No results for input(s): AMMONIA in the last 168 hours. Coagulation Profile: No results for input(s): INR, PROTIME in the last 168 hours. Cardiac Enzymes: No results for input(s): CKTOTAL, CKMB, CKMBINDEX, TROPONINI in the last 168 hours. BNP (last 3 results) No results for input(s): PROBNP in the last 8760 hours. HbA1C: No results for input(s): HGBA1C in the last 72 hours. CBG: Recent Labs  Lab 09/23/19 1456 09/24/19 0431 09/24/19 0540  GLUCAP 69* 83 110*   Lipid Profile: No results for input(s): CHOL, HDL, LDLCALC, TRIG, CHOLHDL, LDLDIRECT in the last 72 hours. Thyroid Function Tests: No results for input(s): TSH, T4TOTAL, FREET4, T3FREE, THYROIDAB in the last 72 hours. Anemia Panel: No results for input(s): VITAMINB12, FOLATE, FERRITIN, TIBC, IRON, RETICCTPCT in the last 72 hours. Urine analysis:     Component Value Date/Time   COLORURINE YELLOW 09/24/2019 0527   APPEARANCEUR TURBID (A) 09/24/2019 0527   LABSPEC 1.034 (H) 09/24/2019 0527   PHURINE 5.0 09/24/2019 0527   GLUCOSEU NEGATIVE 09/24/2019 0527   GLUCOSEU NEGATIVE 07/21/2019 1628   HGBUR MODERATE (A) 09/24/2019 0527   BILIRUBINUR NEGATIVE 09/24/2019 0527   BILIRUBINUR negative 09/16/2011 1047   KETONESUR 80 (A) 09/24/2019 0527   PROTEINUR 100 (A) 09/24/2019 0527   UROBILINOGEN 0.2 07/21/2019 1628   NITRITE NEGATIVE 09/24/2019 0527   LEUKOCYTESUR TRACE (A) 09/24/2019 0527   Sepsis Labs: @LABRCNTIP (procalcitonin:4,lacticidven:4)  ) Recent Results (from the past 240 hour(s))  SARS Coronavirus 2 by RT PCR (hospital order, performed in [place] hospital lab) Nasopharyngeal Nasopharyngeal Swab     Status: None   Collection Time: 09/24/19 10:11 AM   Specimen: Nasopharyngeal Swab  Result Value Ref Range Status   SARS Coronavirus 2 NEGATIVE NEGATIVE Final    Comment: (NOTE) SARS-CoV-2 target nucleic acids are NOT DETECTED.  The SARS-CoV-2 RNA is generally detectable in upper and lower respiratory specimens during the acute phase of infection. The lowest concentration of SARS-CoV-2 viral copies this assay can detect is 250 copies / mL. A negative result does not preclude SARS-CoV-2 infection and should not be used as the sole basis for treatment or other patient management decisions.  A negative result may occur with improper specimen collection / handling, submission of specimen other than nasopharyngeal swab, presence of viral mutation(s) within the areas targeted by this assay, and inadequate number of viral copies (<250 copies / mL). A negative result must be combined with clinical observations, patient history, and epidemiological information.  Fact Sheet for Patients:   StrictlyIdeas.no  Fact Sheet for Healthcare Providers: BankingDealers.co.za  This test is not  yet approved or  cleared by the [place] FDA and has been authorized for detection and/or diagnosis of SARS-CoV-2 by FDA under an Emergency Use Authorization (EUA).  This EUA will remain in effect (meaning this test can be used) for the duration of the COVID-19 declaration under Section 564(b)(1) of the Act, 21 U.S.C. section 360bbb-3(b)(1), unless the authorization is terminated or revoked sooner.  Performed at [facility], [place] [address], [place], [place] [address]       Radiology  Studies: US Abdomen Limited RUQ  Result Date: 09/26/2019 CLINICAL DATA:  Abnormal liver function tests, hypertension EXAM: ULTRASOUND ABDOMEN LIMITED RIGHT UPPER QUADRANT COMPARISON:  07/06/2018 FINDINGS: Gallbladder: No gallstones or wall thickening visualized. No sonographic Murphy sign noted by sonographer. Common bile duct: Diameter: 6 mm Liver: Diffuse increased liver echotexture consistent with hepatic steatosis. No focal liver abnormality. Portal vein is patent on color Doppler imaging with normal direction of blood flow towards the liver. Other: None. IMPRESSION: 1. Stable hepatic steatosis. 2. No acute process. Electronically Signed   By: Randa Ngo M.D.   On: 09/26/2019 19:53     Scheduled Meds: . apixaban  5 mg Oral BID  . aspirin EC  81 mg Oral Daily  . cloNIDine  0.1 mg Oral BID  . famotidine  20 mg Oral Daily  . methocarbamol  500 mg Oral QID  . mometasone-formoterol  2 puff Inhalation BID  . multivitamin with minerals  1 tablet Oral Daily  . polyethylene glycol  17 g Oral BID  . senna-docusate  1 tablet Oral BID  . thiamine  100 mg Oral Daily   Or  . thiamine  100 mg Intravenous Daily   Continuous Infusions:   LOS: 4 days   Time Spent in minutes   45 minutes  [name] D.O. on 09/28/2019 at 11:02 AM  Between 7am to 7pm - Please see pager noted on [email or web address]  After 7pm go to [email or web address]  And look for the night coverage person covering for me after  hours  [facility] Office  [phone]

## 2019-09-28 NOTE — Progress Notes (Signed)
Called by RN.  Patient with 14 beat run of V. tach which was asymptomatic.  Patient was sitting in chair watching TV at the time and had no palpitations or chest pain.  Last magnesium level was yesterday and was normal.  Last potassium level was earlier this morning and normal. Patient is hemodynamically stable with no complaints at this time. Check potassium level, magnesium level and EKG now

## 2019-09-28 NOTE — Progress Notes (Signed)
Notified Harrold Donath, MD of patient having 14 beat run of Vtach. Pt asymptomatic up in chair resting at time of event. Received callback from provider with new orders for lab draw and EKG.

## 2019-09-28 NOTE — TOC Initial Note (Signed)
Transition of Care (TOC) - Initial/Assessment Note  Valentina Gu, BSN Transitions of Care Unit 4E- RN Case Manager See Treatment Team for direct phone # Cross Coverage for [place]   Patient Details  Name: [name] MRN: [id number] Date of Birth: [birth year]  Transition of Care [facility]) CM/SW Contact:    Dawayne Patricia, RN Phone Number: 09/28/2019, 4:13 PM  Clinical Narrative:                 Noted pt had DME orders for hospital bed and tub bench with recs for [facility] per PT- CM attempted to speak with pt at bedside- discussed DME orders- per pt she states she does not need or want the hospital bed however she does want the tub bench- explained insurance may or may not cover cost- pt does want to use in house provider to see if insurance will cover and states she will pay out of pocket cost if needed. Tried to discuss [place]- and provided pt with list for [facility] choice Per CMS guidelines from medicare.gov website with star ratings (copy placed in shadow chart)- however pt request that CM return at a later time to continue discussion.  TOC will f/u for [facility] choice and referral pending orders prior to discharge.  Call made to [facility] with [employer] for tub bench needs- tub bench to be delivered to room if pt agreeable to cost.   Expected Discharge Plan: [place] Barriers to Discharge: Continued Medical Work up   Patient Goals and CMS Choice Patient states their goals for this hospitalization and ongoing recovery are:: return home CMS Medicare.gov Compare Post Acute Care list provided to:: Patient Choice offered to / list presented to : Patient  Expected Discharge Plan and Services Expected Discharge Plan: [place]   Discharge Planning Services: CM Consult Post Acute Care Choice: Durable Medical Equipment, Home Health Living arrangements for the past 2 months: [place]                 DME Arranged: Hospital bed, Tub bench DME Agency:  [facility] Date DME Agency Contacted: 09/28/19 Time DME Agency Contacted: 1300 Representative spoke with at DME Agency: Thedore Mins            Prior Living Arrangements/Services Living arrangements for the past 2 months: [place] with:: Spouse                   Activities of Daily Living Home Assistive Devices/Equipment: None ADL Screening (condition at time of admission) Patient's cognitive ability adequate to safely complete daily activities?: Yes Is the patient deaf or have difficulty hearing?: No Does the patient have difficulty seeing, even when wearing glasses/contacts?: No Does the patient have difficulty concentrating, remembering, or making decisions?: No Patient able to express need for assistance with ADLs?: No Does the patient have difficulty dressing or bathing?: No Independently performs ADLs?: Yes (appropriate for developmental age) Does the patient have difficulty walking or climbing stairs?: No Weakness of Legs: None Weakness of Arms/Hands: None  Permission Sought/Granted                  Emotional Assessment              Admission diagnosis:  Alcohol withdrawal ([place]) [F10.239] Patient Active Problem List   Diagnosis Date Noted  . Alcohol withdrawal ([place]) 09/24/2019  . Alcoholic intoxication without complication ([place]) 21/19/4174  . Pneumonia due to COVID-19 virus 02/10/2019  .  Pulmonary embolism ([place]) 07/06/2018  . Elevated troponin 07/06/2018  . Leukocytosis 09/22/2017  . Abnormal urine color 09/22/2017  . Rash 09/22/2017  . Sepsis ([place]) 09/12/2016  . Colovesical fistula 09/12/2016  . Hypertension 07/28/2016  . Alcohol abuse 07/28/2016  . Obesity (BMI 30-39.9) 07/28/2016  . Abscess of bladder 07/28/2016  . Colonic diverticulum 07/28/2016  . Acute hypokalemia 07/28/2016  . Hyperlipidemia 07/10/2016  . Mass of left side of neck 07/10/2016  . Head and neck lymphadenopathy 07/10/2016  . Acute sinus infection 01/25/2016   . Eustachian tube disorder 01/25/2016  . Asthma exacerbation 05/18/2015  . Peripheral edema 03/16/2015  . Asthma 03/16/2015  . Right shoulder pain 03/16/2015  . Hypokalemia 10/04/2014  . Upper airway cough syndrome 10/03/2014  . Obesity, Class III, BMI 40-49.9 (morbid obesity) ([place]) 10/03/2014  . Lower back pain 05/25/2014  . Bilateral shoulder pain 05/25/2014  . Chest pain 04/14/2014  . Allergic rhinitis 12/06/2013  . Impaired glucose tolerance 12/06/2013  . Expected blood loss anemia 12/16/2012  . Morbid obesity ([place]) 12/15/2012  . S/P left TKA 12/14/2012  . Goiter 11/26/2012  . Preop exam for internal medicine 11/26/2012  . Diarrhea 02/25/2012  . Vaginal bleeding 04/30/2011  . Colon polyps 02/10/2011  . Eczema 02/10/2011  . Depression 02/10/2011  . Preventative health care 09/27/2010  . Anxiety state 04/05/2010  . DIVERTICULITIS, HX OF 04/05/2010  . PALPITATIONS, HX OF 09/14/2007  . Obesity 04/24/2007  . VOCAL CORD DISORDER 04/24/2007  . Extrinsic asthma 04/24/2007  . GERD 04/24/2007   PCP:  Biagio Borg, MD Pharmacy:   [id number] [place], [place] [address] [place] [address] Phone: [phone] Fax: [phone]  [facility] Transitions of [place], [place] - [address] [place] [address] Phone: [phone] Fax: [phone]     Social Determinants of Health (SDOH) Interventions    Readmission Risk Interventions No flowsheet data found.

## 2019-09-29 ENCOUNTER — Other Ambulatory Visit: Payer: Self-pay

## 2019-09-29 LAB — COMPREHENSIVE METABOLIC PANEL
ALT: 146 U/L — ABNORMAL HIGH (ref 0–44)
AST: 107 U/L — ABNORMAL HIGH (ref 15–41)
Albumin: 2.8 g/dL — ABNORMAL LOW (ref 3.5–5.0)
Alkaline Phosphatase: 86 U/L (ref 38–126)
Anion gap: 8 (ref 5–15)
BUN: 10 mg/dL (ref 6–20)
CO2: 26 mmol/L (ref 22–32)
Calcium: 9.1 mg/dL (ref 8.9–10.3)
Chloride: 105 mmol/L (ref 98–111)
Creatinine, Ser: 0.84 mg/dL (ref 0.44–1.00)
GFR calc Af Amer: >60 mL/min (ref >60)
GFR calc non Af Amer: >60 mL/min (ref >60)
Glucose, Bld: 112 mg/dL — ABNORMAL HIGH (ref 70–99)
Potassium: 3.6 mmol/L (ref 3.5–5.1)
Sodium: 139 mmol/L (ref 135–145)
Total Bilirubin: 0.3 mg/dL (ref 0.3–1.2)
Total Protein: 5.9 g/dL — ABNORMAL LOW (ref 6.5–8.1)

## 2019-09-29 LAB — PHOSPHORUS: Phosphorus: 4 mg/dL (ref 2.5–4.6)

## 2019-09-29 MED ORDER — ASPIRIN 81 MG PO TBEC: 81.0000 mg | DELAYED_RELEASE_TABLET | Freq: Every day | ORAL | 0 refills | Status: DC

## 2019-09-29 MED ORDER — CLONIDINE HCL 0.1 MG PO TABS: 0.1000 mg | ORAL_TABLET | Freq: Two times a day (BID) | ORAL | 0 refills | Status: DC

## 2019-09-29 MED ORDER — ADULT MULTIVITAMIN W/MINERALS CH: 1.0000 | ORAL_TABLET | Freq: Every day | ORAL | Status: DC

## 2019-09-29 MED ORDER — ATORVASTATIN CALCIUM 20 MG PO TABS: 20.0000 mg | ORAL_TABLET | Freq: Every day | ORAL | Status: DC

## 2019-09-29 MED ORDER — THIAMINE HCL 100 MG PO TABS: 100.0000 mg | ORAL_TABLET | Freq: Every day | ORAL | Status: DC

## 2019-09-29 MED ORDER — HYDROXYZINE HCL 25 MG PO TABS: 25.0000 mg | ORAL_TABLET | Freq: Three times a day (TID) | ORAL | 0 refills | Status: DC | PRN

## 2019-09-29 NOTE — Discharge Instructions (Signed)
Apixaban oral tablets What is this medicine? APIXABAN (a PIX a ban) is an anticoagulant (blood thinner). It is used to lower the chance of stroke in people with a medical condition called atrial fibrillation. It is also used to treat or prevent blood clots in the lungs or in the veins. This medicine may be used for other purposes; ask your health care provider or pharmacist if you have questions. COMMON BRAND NAME(S): Eliquis What should I tell my health care provider before I take this medicine? They need to know if you have any of these conditions:  antiphospholipid antibody syndrome  bleeding disorders  bleeding in the brain  blood in your stools (black or tarry stools) or if you have blood in your vomit  history of blood clots  history of stomach bleeding  kidney disease  liver disease  mechanical heart valve  an unusual or allergic reaction to apixaban, other medicines, foods, dyes, or preservatives  pregnant or trying to get pregnant  breast-feeding How should I use this medicine? Take this medicine by mouth with a glass of water. Follow the directions on the prescription label. You can take it with or without food. If it upsets your stomach, take it with food. Take your medicine at regular intervals. Do not take it more often than directed. Do not stop taking except on your doctor's advice. Stopping this medicine may increase your risk of a blood clot. Be sure to refill your prescription before you run out of medicine. Talk to your pediatrician regarding the use of this medicine in children. Special care may be needed. Overdosage: If you think you have taken too much of this medicine contact a poison control center or emergency room at once. NOTE: This medicine is only for you. Do not share this medicine with others. What if I miss a dose? If you miss a dose, take it as soon as you can. If it is almost time for your next dose, take only that dose. Do not take double or  extra doses. What may interact with this medicine? This medicine may interact with the following:  aspirin and aspirin-like medicines  certain medicines for fungal infections like ketoconazole and itraconazole  certain medicines for seizures like carbamazepine and phenytoin  certain medicines that treat or prevent blood clots like warfarin, enoxaparin, and dalteparin  clarithromycin  NSAIDs, medicines for pain and inflammation, like ibuprofen or naproxen  rifampin  ritonavir  St. John's wort This list may not describe all possible interactions. Give your health care provider a list of all the medicines, herbs, non-prescription drugs, or dietary supplements you use. Also tell them if you smoke, drink alcohol, or use illegal drugs. Some items may interact with your medicine. What should I watch for while using this medicine? Visit your healthcare professional for regular checks on your progress. You may need blood work done while you are taking this medicine. Your condition will be monitored carefully while you are receiving this medicine. It is important not to miss any appointments. Avoid sports and activities that might cause injury while you are using this medicine. Severe falls or injuries can cause unseen bleeding. Be careful when using sharp tools or knives. Consider using an electric razor. Take special care brushing or flossing your teeth. Report any injuries, bruising, or red spots on the skin to your healthcare professional. If you are going to need surgery or other procedure, tell your healthcare professional that you are taking this medicine. Wear a medical ID bracelet   or chain. Carry a card that describes your disease and details of your medicine and dosage times. What side effects may I notice from receiving this medicine? Side effects that you should report to your doctor or health care professional as soon as possible:  allergic reactions like skin rash, itching or hives,  swelling of the face, lips, or tongue  signs and symptoms of bleeding such as bloody or black, tarry stools; red or dark-brown urine; spitting up blood or brown material that looks like coffee grounds; red spots on the skin; unusual bruising or bleeding from the eye, gums, or nose  signs and symptoms of a blood clot such as chest pain; shortness of breath; pain, swelling, or warmth in the leg  signs and symptoms of a stroke such as changes in vision; confusion; trouble speaking or understanding; severe headaches; sudden numbness or weakness of the face, arm or leg; trouble walking; dizziness; loss of coordination This list may not describe all possible side effects. Call your doctor for medical advice about side effects. You may report side effects to FDA at 1-800-FDA-1088. Where should I keep my medicine? Keep out of the reach of children. Store at room temperature between 20 and 25 degrees C (68 and 77 degrees F). Throw away any unused medicine after the expiration date. NOTE: This sheet is a summary. It may not cover all possible information. If you have questions about this medicine, talk to your doctor, pharmacist, or health care provider.  2020 Elsevier/Gold Standard (2017-10-07 17:39:34)  

## 2019-09-29 NOTE — Progress Notes (Signed)
Physical Therapy Treatment Patient Details Name: [name] MRN: [id number] DOB: 1960-10-29 Today's Date: 09/29/2019    History of Present Illness [name] is a 59 y.o. female with PMH significant for morbid obesity, alcohol dependence, hypertension and pulmonary embolism, who presents to [facility] for chest pain.    PT Comments    Pt sitting up in recliner on entry. Agreeable to ambulation with therapy and stair training prior to return home this afternoon. Pt is mod I for bed mobility, supervision for transfers without AD and min guard for ascent/descent of 3 steps with rail. Pt educated on need for short bouts of mobility to improve endurance.    Follow Up Recommendations  Home health PT           Precautions / Restrictions Precautions Precautions: Fall Restrictions Weight Bearing Restrictions: No    Mobility  Bed Mobility               General bed mobility comments: sitting up in recliner on entry  Transfers Overall transfer level: Needs assistance Equipment used: Rolling walker (2 wheeled);1 person hand held assist Transfers: Sit to/from Stand Sit to Stand: Modified independent (Device/Increase time)         General transfer comment: good power up, increased effort required  Ambulation/Gait Ambulation/Gait assistance: Supervision Gait Distance (Feet): 250 Feet Assistive device: None Gait Pattern/deviations: Step-through pattern;Decreased stride length;Wide base of support;Shuffle Gait velocity: reduced Gait velocity interpretation: <1.31 ft/sec, indicative of household ambulator General Gait Details: supervsion for slow, waddling gait, mild instability, no overt LoB, 4/4 DoE requiring standing rest break before gait training   Stairs Stairs: Yes Stairs assistance: Min guard Stair Management: One rail Left;Forwards Number of Stairs: 3 General stair comments: slow, ascent/descent, increased effort, 4/4 DoE requiring standing rest break before  ambulating back to room           Balance Overall balance assessment: Needs assistance   Sitting balance-Leahy Scale: Good       Standing balance-Leahy Scale: Good                              Cognition Arousal/Alertness: Awake/alert Behavior During Therapy: WFL for tasks assessed/performed Overall Cognitive Status: Within Functional Limits for tasks assessed                                           General Comments General comments (skin integrity, edema, etc.): VSS on RA      Pertinent Vitals/Pain Pain Assessment: No/denies pain           PT Goals (current goals can now be found in the care plan section) Acute Rehab PT Goals Patient Stated Goal: go home PT Goal Formulation: With patient Time For Goal Achievement: 10/11/19 Progress towards PT goals: Progressing toward goals    Frequency    Min 3X/week      PT Plan Current plan remains appropriate       AM-PAC PT "6 Clicks" Mobility   Outcome Measure  Help needed turning from your back to your side while in a flat bed without using bedrails?: None Help needed moving from lying on your back to sitting on the side of a flat bed without using bedrails?: None Help needed moving to and from a bed to a chair (including a wheelchair)?: None Help needed standing up  from a chair using your arms (e.g., wheelchair or bedside chair)?: None Help needed to walk in hospital room?: None Help needed climbing 3-5 steps with a railing? : None 6 Click Score: 24    End of Session Equipment Utilized During Treatment: Gait belt Activity Tolerance: Patient tolerated treatment well;Patient limited by fatigue;Patient limited by lethargy;Patient limited by pain Patient left: with call bell/phone within reach;in chair Nurse Communication: Mobility status PT Visit Diagnosis: Unsteadiness on feet (R26.81);Muscle weakness (generalized) (M62.81)     Time: 4799-8721 PT Time Calculation (min)  (ACUTE ONLY): 13 min  Charges:  $Gait Training: 8-22 mins                     Evadene Wardrip B. Migdalia Dk PT, DPT Acute Rehabilitation Services Pager [phone] Office [phone]    [place] 09/29/2019, 11:59 AM

## 2019-09-29 NOTE — Discharge Summary (Signed)
Physician Discharge Summary  [name] HYI:[id number] DOB: 12-Jun-1960 DOA: 09/23/2019  PCP: Biagio Borg, MD  Admit date: 09/23/2019 Discharge date: 09/29/2019  Time spent: 45 minutes  Recommendations for Outpatient Follow-up:  Patient will be discharged to home.  Patient will need to follow up with primary care provider within one week of discharge. Follow up with Dr. Benson Norway, gastroenterology, in one week and repeat LFTs.  Patient should continue medications as prescribed.  Patient should follow a low fat diet.    Discharge Diagnoses:  Alcohol abuse with alcohol withdrawal History of pulmonary embolism Chest pain Essential hypertension Abnormal LFTs Hypophosphatemia Lactic acidosis Morbid obesity Erythrocytosis   Discharge Condition: Stable  Diet recommendation: low fat  Filed Weights   09/26/19 0500 09/28/19 0547 09/29/19 0432  Weight: (!) 139.8 kg (!) 143.1 kg (!) 141.8 kg    History of present illness:  on 09/24/2019 by Dr. Royce Macadamia Agbata Vernell [name] a 59 y.o.femalewith medical history significant formorbid obesity, alcohol dependence, hypertension and pulmonary embolism on Eliquis who presents to the emergency room for evaluation of chest pain which was mostly over the left anterior chest wall, it was nonradiating and she denied having any associated shortness of breath, palpitations or diaphoresis. She denies having any known aggravating or relieving factors. Patient states that she used to be a weekend drinker but since the onset of the pandemic about a year and half ago she started drinking heavily every day and admits to having symptoms of alcohol withdrawal when she does not drink. She states that she drinks about 1/5 of vodka daily. She has not been eating or drinking well due to the alcohol binge and complains of nausea associated with vomiting and diarrhea. She denies having any abdominal pain. Her last drink was the day prior to coming to the  emergency room and she is now tremulous. She admits to having symptoms of alcohol withdrawal when she does not drink. In the ER she was noted to be hypoglycemic with blood sugar of 68 that improved following food and fluid intake. She was tachycardic with heart rate in the 120s and had a CIWA score of 7 for which CIWA protocol was initiated and she received a dose of lorazepam. She remains tachycardic with an increase in her CIWA score and so will be hospitalized. Patient is requesting assistance for her alcohol dependence. Labs reveal a sodium of 138, potassium of 4.4, chloride of 97, glucose of 68, BUN of 20, creatinine of 1.08, AST of 70, ALT of 50, troponin of 32, initial lactate of 2.6>> 1.8 Twelve-lead EKG reviewed by me shows sinus tachycardia  Hospital Course:  Alcohol abuse with alcohol withdrawal -Patient presented with alcohol abuse when she presented to the ED for chest pain -Admitted to drinking 1/5 of vodka per day -Per previous documentation, patient did have active withdrawal with tremors, tachycardia and hallucinations, which appeared to have resolved -Continue clonidine -was placed on CIWA protocol -Urine drug screen unremarkable -EtOH level on admission 281 -social work consulted for resources  History of pulmonary embolism -Continue Eliquis  Chest pain -Denies current chest pain -Etiology unknown -Chest x-ray showed no focal airspace disease. -EKG on admission did show sinus tachycardia but no evidence of ST elevation or depression -Patient did have slight elevated and her troponins peak of 42 -Continue aspirin -Lipitor held due to elevated LFTs  Essential hypertension -Continue hydralazine, clonidine  Abnormal LFTs -Right upper quadrant ultrasound showed stable hepatic steatosis, no acute process -Acute hepatitis panel unremarkable -  Discussed with Dr. Benson Norway, strength neurology, who recommended following and trending LFTs for an additional day.  -LFTS  trending downward today, AST 107 (peaked at 140), ALT 146 (peaked 152) -repeat LFTs in one week and follow up with Dr. Benson Norway  Hypophosphatemia -resolved with supplementation  Lactic acidosis -Improved, suspect secondary to the above  Morbid obesity -BMI 57.69 -Patient needs to follow-up with PCP to discuss lifestyle modification  Erythrocytosis  -Resolved  Consultants Gastroenterology, Dr. Benson Norway, via phone  Procedures  RUQ ultrasound  Discharge Exam: Vitals:   09/29/19 0904  BP:   Pulse: 89  Resp: 19  Temp:   SpO2: 98%     General: Well developed, well nourished, NAD, appears stated age  HEENT: NCAT, mucous membranes moist.  Cardiovascular: S1 S2 auscultated,RRR  Respiratory: Clear to auscultation bilaterally with equal chest rise  Abdomen: Soft, obese, nontender, nondistended, + bowel sounds  Extremities: warm dry without cyanosis clubbing. Trace LE edema  Neuro: AAOx3, nonfocal  Psych: Appropriate mood and affect, pleasant  Discharge Instructions Discharge Instructions    Discharge instructions   Complete by: As directed    Patient will be discharged to home.  Patient will need to follow up with primary care provider within one week of discharge. Follow up with Dr. Benson Norway, gastroenterology, in one week and repeat LFTs.  Patient should continue medications as prescribed.  Patient should follow a low fat diet.   Hold your atorvastatin until your liver enzymes have returned to normal. For now, avoid NSAIDs.     Allergies as of 09/29/2019      Reactions   Morphine Itching   Shrimp [shellfish Allergy] Itching, Other (See Comments)   Tongue burns also   Tramadol Other (See Comments)   Caused confusion   Latex Rash      Medication List    STOP taking these medications   desoximetasone 0.05 % cream Commonly known as: TOPICORT   HYDROcodone-homatropine 5-1.5 MG/5ML syrup Commonly known as: HYCODAN   ondansetron 4 MG tablet Commonly known as:  Zofran   phentermine 37.5 MG capsule     TAKE these medications   Advair Diskus 250-50 MCG/DOSE Aepb Generic drug: Fluticasone-Salmeterol Inhale 1 puff into the lungs 2 (two) times daily.   albuterol 108 (90 Base) MCG/ACT inhaler Commonly known as: ProAir HFA INHALE 1 TO 2 PUFFS BY MOUTH EVERY 6 HOURS AS NEEDED FOR WHEEZE What changed:   how much to take  how to take this  when to take this  reasons to take this  additional instructions   apixaban 5 MG Tabs tablet Commonly known as: Eliquis Take 1 tablet (5 mg total) by mouth 2 (two) times daily.   aspirin 81 MG EC tablet Take 1 tablet (81 mg total) by mouth daily. Swallow whole. Start taking on: September 30, 2019   atorvastatin 20 MG tablet Commonly known as: Lipitor Take 1 tablet (20 mg total) by mouth daily. Hold until your liver enzymes have normalized. What changed: additional instructions   cimetidine 200 MG tablet Commonly known as: TAGAMET Take 200 mg by mouth daily.   cloNIDine 0.1 MG tablet Commonly known as: CATAPRES Take 1 tablet (0.1 mg total) by mouth 2 (two) times daily.   hydrOXYzine 25 MG tablet Commonly known as: ATARAX/VISTARIL Take 1 tablet (25 mg total) by mouth 3 (three) times daily as needed for anxiety.   Klor-Con M20 20 MEQ tablet Generic drug: potassium chloride SA TAKE 1 TABLET BY MOUTH EVERY DAY What changed:  how much to take  when to take this   losartan-hydrochlorothiazide 100-25 MG tablet Commonly known as: HYZAAR TAKE 1 TABLET BY MOUTH EVERY DAY   methocarbamol 500 MG tablet Commonly known as: Robaxin Take 1 tablet (500 mg total) by mouth 4 (four) times daily.   multivitamin with minerals Tabs tablet Take 1 tablet by mouth daily. Start taking on: September 30, 2019   thiamine 100 MG tablet Take 1 tablet (100 mg total) by mouth daily. Start taking on: September 30, 2019            Durable Medical Equipment  (From admission, onward)         Start     Ordered     09/27/19 1608  For home use only DME Hospital bed  Once       Question Answer Comment  Length of Need 6 Months   The above medical condition requires: Patient requires the ability to reposition frequently   Bed type Semi-electric      09/27/19 1607   09/27/19 1608  For home use only DME Tub bench  Once        09/27/19 1607         Allergies  Allergen Reactions  . Morphine Itching  . Shrimp [Shellfish Allergy] Itching and Other (See Comments)    Tongue burns also  . Tramadol Other (See Comments)    Caused confusion  . Latex Rash      The results of significant diagnostics from this hospitalization (including imaging, microbiology, ancillary and laboratory) are listed below for reference.    Significant Diagnostic Studies: DG Chest 2 View  Result Date: 09/23/2019 CLINICAL DATA:  Chest pain EXAM: CHEST - 2 VIEW COMPARISON:  05/23/2019 chest radiograph.  07/06/2018 CTA chest. FINDINGS: Cardiomegaly. Hypoinflated lungs. No focal consolidation. No pneumothorax or pleural effusion. Multilevel spondylosis. IMPRESSION: No focal airspace disease. Cardiomegaly. Electronically Signed   By: Primitivo Gauze M.D.   On: 09/23/2019 16:22   US Abdomen Limited RUQ  Result Date: 09/26/2019 CLINICAL DATA:  Abnormal liver function tests, hypertension EXAM: ULTRASOUND ABDOMEN LIMITED RIGHT UPPER QUADRANT COMPARISON:  07/06/2018 FINDINGS: Gallbladder: No gallstones or wall thickening visualized. No sonographic Murphy sign noted by sonographer. Common bile duct: Diameter: 6 mm Liver: Diffuse increased liver echotexture consistent with hepatic steatosis. No focal liver abnormality. Portal vein is patent on color Doppler imaging with normal direction of blood flow towards the liver. Other: None. IMPRESSION: 1. Stable hepatic steatosis. 2. No acute process. Electronically Signed   By: Randa Ngo M.D.   On: 09/26/2019 19:53    Microbiology: Recent Results (from the past 240 hour(s))  SARS  Coronavirus 2 by RT PCR (hospital order, performed in [facility] hospital lab) Nasopharyngeal Nasopharyngeal Swab     Status: None   Collection Time: 09/24/19 10:11 AM   Specimen: Nasopharyngeal Swab  Result Value Ref Range Status   SARS Coronavirus 2 NEGATIVE NEGATIVE Final    Comment: (NOTE) SARS-CoV-2 target nucleic acids are NOT DETECTED.  The SARS-CoV-2 RNA is generally detectable in upper and lower respiratory specimens during the acute phase of infection. The lowest concentration of SARS-CoV-2 viral copies this assay can detect is 250 copies / mL. A negative result does not preclude SARS-CoV-2 infection and should not be used as the sole basis for treatment or other patient management decisions.  A negative result may occur with improper specimen collection / handling, submission of specimen other than nasopharyngeal swab, presence of viral mutation(s) within the  areas targeted by this assay, and inadequate number of viral copies (<250 copies / mL). A negative result must be combined with clinical observations, patient history, and epidemiological information.  Fact Sheet for Patients:   StrictlyIdeas.no  Fact Sheet for Healthcare Providers: BankingDealers.co.za  This test is not yet approved or  cleared by the [place] FDA and has been authorized for detection and/or diagnosis of SARS-CoV-2 by FDA under an Emergency Use Authorization (EUA).  This EUA will remain in effect (meaning this test can be used) for the duration of the COVID-19 declaration under Section 564(b)(1) of the Act, 21 U.S.C. section 360bbb-3(b)(1), unless the authorization is terminated or revoked sooner.  Performed at [facility], [place] [address] [place], [place] [address]      Labs: Basic Metabolic Panel: Recent Labs  Lab 09/23/19 1530 09/24/19 1601 09/25/19 9702 09/25/19 6378 09/26/19 0329 09/27/19 0342 09/28/19 0836 09/28/19 2056  09/29/19 0427  NA   < >  --  136  --  137 139 138  --  139  K   < >  --  4.1   < > 3.9 3.7 3.9 3.9 3.6  CL   < >  --  100  --  104 106 106  --  105  CO2   < >  --  24  --  23 25 24   --  26  GLUCOSE   < >  --  244*  --  138* 129* 171*  --  112*  BUN   < >  --  16  --  12 13 14   --  10  CREATININE   < >  --  1.06*  --  0.81 0.86 0.89  --  0.84  CALCIUM   < >  --  8.9  --  8.4* 8.9 9.0  --  9.1  MG  --  2.0 2.1  --  2.2 2.0  --  1.8  --   PHOS  --  1.9* 1.9*  --  2.8 3.3  --   --  4.0   < > = values in this interval not displayed.   Liver Function Tests: Recent Labs  Lab 09/25/19 0938 09/26/19 0329 09/27/19 0342 09/28/19 0836 09/29/19 0427  AST 81* 110* 128* 140* 107*  ALT 54* 77* 110* 152* 146*  ALKPHOS 93 91 87 95 86  BILITOT 1.1 0.8 0.6 0.4 0.3  PROT 7.2 6.2* 6.0* 6.4* 5.9*  ALBUMIN 3.4* 2.9* 2.8* 3.0* 2.8*   No results for input(s): LIPASE, AMYLASE in the last 168 hours. No results for input(s): AMMONIA in the last 168 hours. CBC: Recent Labs  Lab 09/23/19 1530 09/25/19 0938 09/26/19 0329 09/27/19 0342  WBC 9.7 8.4 7.7 8.1  NEUTROABS  --  6.5 5.6 5.6  HGB 15.6* 13.8 12.9 12.9  HCT 48.9* 43.6 40.9 41.4  MCV 90.2 92.2 93.6 93.7  PLT 293 212 157 156   Cardiac Enzymes: No results for input(s): CKTOTAL, CKMB, CKMBINDEX, TROPONINI in the last 168 hours. BNP: BNP (last 3 results) Recent Labs    05/23/19 0600  BNP 57.6    ProBNP (last 3 results) No results for input(s): PROBNP in the last 8760 hours.  CBG: Recent Labs  Lab 09/23/19 1456 09/24/19 0431 09/24/19 0540  GLUCAP 69* 83 110*       Signed:  Skylor Hughson  [facility] 09/29/2019, 10:16 AM

## 2019-09-30 ENCOUNTER — Ambulatory Visit: Payer: BC Managed Care – PPO | Admitting: Internal Medicine

## 2019-10-14 ENCOUNTER — Telehealth: Payer: Self-pay | Admitting: Podiatry

## 2019-10-14 NOTE — Telephone Encounter (Signed)
Had toenails removed and keeps having rough skin and Dr. Paulla Dolly typically sands it down, and wanted to know what you can put on it to soften or sand down herself. Please leave detailed message if she does not answer

## 2019-10-19 NOTE — Telephone Encounter (Signed)
Could probably use lubriderm

## 2019-12-02 ENCOUNTER — Encounter: Payer: Self-pay | Admitting: Internal Medicine

## 2019-12-02 ENCOUNTER — Encounter (HOSPITAL_COMMUNITY): Payer: Self-pay

## 2019-12-02 ENCOUNTER — Other Ambulatory Visit: Payer: Self-pay

## 2019-12-02 ENCOUNTER — Telehealth (HOSPITAL_COMMUNITY): Payer: Self-pay | Admitting: Urgent Care

## 2019-12-02 ENCOUNTER — Ambulatory Visit (HOSPITAL_COMMUNITY)
Admission: EM | Admit: 2019-12-02 | Discharge: 2019-12-02 | Disposition: A | Discharge status: Home / Self Care | Payer: BC Managed Care – PPO | Attending: Urgent Care | Admitting: Urgent Care

## 2019-12-02 DIAGNOSIS — N3001 Acute cystitis with hematuria: Secondary | ICD-10-CM | POA: Diagnosis not present

## 2019-12-02 DIAGNOSIS — R300 Dysuria: Secondary | ICD-10-CM

## 2019-12-02 DIAGNOSIS — R350 Frequency of micturition: Secondary | ICD-10-CM | POA: Insufficient documentation

## 2019-12-02 LAB — POCT URINALYSIS DIPSTICK, ED / UC
Glucose, UA: NEGATIVE mg/dL
Ketones, ur: NEGATIVE mg/dL
Nitrite: NEGATIVE
Protein, ur: 30 mg/dL — AB
Specific Gravity, Urine: 1.025 (ref 1.005–1.030)
Urobilinogen, UA: 0.2 mg/dL (ref 0.0–1.0)
pH: 5.5 (ref 5.0–8.0)

## 2019-12-02 MED ORDER — ONDANSETRON 8 MG PO TBDP
8.0000 mg | ORAL_TABLET | Freq: Three times a day (TID) | ORAL | 0 refills | Status: DC | PRN
Start: 2019-12-02 — End: 2020-03-01

## 2019-12-02 MED ORDER — CEPHALEXIN 500 MG PO CAPS
500.0000 mg | ORAL_CAPSULE | Freq: Two times a day (BID) | ORAL | 0 refills | Status: DC
Start: 2019-12-02 — End: 2019-12-19

## 2019-12-02 NOTE — ED Provider Notes (Signed)
[name]   MRN: [id number] DOB: 12/15/1960  Subjective:   [name] is a 59 y.o. female presenting for 3-day history of acute onset dysuria, urinary frequency, intermittent lower back pain.  Denies fever, nausea, vomiting, belly pain, flank pain, chills.  Patient has a history of UTIs, states that it feels very similar.  No current facility-administered medications for this encounter.  Current Outpatient Medications:  .  ADVAIR DISKUS 250-50 MCG/DOSE AEPB, Inhale 1 puff into the lungs 2 (two) times daily., Disp: , Rfl:  .  albuterol (PROAIR HFA) 108 (90 Base) MCG/ACT inhaler, INHALE 1 TO 2 PUFFS BY MOUTH EVERY 6 HOURS AS NEEDED FOR WHEEZE (Patient taking differently: Inhale 2 puffs into the lungs every 6 (six) hours as needed for shortness of breath. ), Disp: 8.5 g, Rfl: 5 .  apixaban (ELIQUIS) 5 MG TABS tablet, Take 1 tablet (5 mg total) by mouth 2 (two) times daily., Disp: 60 tablet, Rfl: 5 .  aspirin EC 81 MG EC tablet, Take 1 tablet (81 mg total) by mouth daily. Swallow whole., Disp: 30 tablet, Rfl: 0 .  atorvastatin (LIPITOR) 20 MG tablet, Take 1 tablet (20 mg total) by mouth daily. Hold until your liver enzymes have normalized., Disp: , Rfl:  .  cimetidine (TAGAMET) 200 MG tablet, Take 200 mg by mouth daily. , Disp: , Rfl:  .  cloNIDine (CATAPRES) 0.1 MG tablet, Take 1 tablet (0.1 mg total) by mouth 2 (two) times daily., Disp: 60 tablet, Rfl: 0 .  hydrOXYzine (ATARAX/VISTARIL) 25 MG tablet, Take 1 tablet (25 mg total) by mouth 3 (three) times daily as needed for anxiety., Disp: 30 tablet, Rfl: 0 .  KLOR-CON M20 20 MEQ tablet, TAKE 1 TABLET BY MOUTH EVERY DAY (Patient taking differently: Take 10 mEq by mouth every other day. ), Disp: 90 tablet, Rfl: 2 .  losartan-hydrochlorothiazide (HYZAAR) 100-25 MG tablet, TAKE 1 TABLET BY MOUTH EVERY DAY (Patient taking differently: Take 1 tablet by mouth daily. ), Disp: 90 tablet, Rfl: 2 .  methocarbamol (ROBAXIN) 500 MG  tablet, Take 1 tablet (500 mg total) by mouth 4 (four) times daily., Disp: 60 tablet, Rfl: 3 .  Multiple Vitamin (MULTIVITAMIN WITH MINERALS) TABS tablet, Take 1 tablet by mouth daily., Disp: , Rfl:  .  thiamine 100 MG tablet, Take 1 tablet (100 mg total) by mouth daily., Disp: , Rfl:    Allergies  Allergen Reactions  . Morphine Itching  . Shrimp [Shellfish Allergy] Itching and Other (See Comments)    Tongue burns also  . Tramadol Other (See Comments)    Caused confusion  . Latex Rash    Past Medical History:  Diagnosis Date  . Alcohol abuse    abuse- moderate years ago - states only drinks now on weekends -2 to 8 driinks   . Asthma   . COLONIC POLYPS, HX OF 04/05/2010  . DIVERTICULITIS, HX OF 04/05/2010  . DJD (degenerative joint disease)    right knee, mot to severe  . GERD (gastroesophageal reflux disease)    no meds  . Heart murmur    hx of   . Hyperlipidemia   . Hypertension   . Impaired glucose tolerance 12/06/2013  . Obesity (BMI 30-39.9)   . PALPITATIONS, HX OF 09/14/2007     Past Surgical History:  Procedure Laterality Date  . ABDOMINAL HYSTERECTOMY  age 64   fibroids  . COLONOSCOPY WITH PROPOFOL N/A 07/25/2016   Procedure: COLONOSCOPY WITH PROPOFOL;  Surgeon:  Carol Ada, MD;  Location: [facility];  Service: Endoscopy;  Laterality: N/A;  . colonscopy     x 2  . IR RADIOLOGIST EVAL & MGMT  08/12/2016  . IR RADIOLOGIST EVAL & MGMT  08/21/2016  . KNEE ARTHROSCOPY     left   . TOTAL KNEE ARTHROPLASTY  07/29/2011   Procedure: TOTAL KNEE ARTHROPLASTY;  Surgeon: Mauri Pole, MD;  Location: [facility];  Service: Orthopedics;  Laterality: Right;  . TOTAL KNEE ARTHROPLASTY Left 12/14/2012   Procedure: LEFT TOTAL KNEE ARTHROPLASTY;  Surgeon: Mauri Pole, MD;  Location: [facility];  Service: Orthopedics;  Laterality: Left;    Family History  Problem Relation Age of Onset  . Stroke Mother   . COPD Father   . Lymphoma Sister     Social History   Tobacco Use  .  Smoking status: Former Smoker    Packs/day: 0.50    Years: 7.00    Pack years: 3.50    Types: Cigarettes    Quit date: 02/11/1988    Years since quitting: 31.8  . Smokeless tobacco: Never Used  Vaping Use  . Vaping Use: Never used  Substance Use Topics  . Alcohol use: Yes    Comment: Occas  . Drug use: No    Comment: smoked marajuania x 10 years.  Quit in 1985.    ROS   Objective:   Vitals: BP (!) 153/99 (BP Location: Right Wrist)   Pulse (!) 109   Temp 99.3 F (37.4 C) (Oral)   Resp (!) 21   SpO2 96%   Physical Exam Constitutional:      General: She is not in acute distress.    Appearance: Normal appearance. She is well-developed. She is obese. She is not ill-appearing, toxic-appearing or diaphoretic.  HENT:     Head: Normocephalic and atraumatic.     Nose: Nose normal.     Mouth/Throat:     Mouth: Mucous membranes are moist.     Pharynx: Oropharynx is clear.  Eyes:     General: No scleral icterus.       Right eye: No discharge.        Left eye: No discharge.     Extraocular Movements: Extraocular movements intact.     Conjunctiva/sclera: Conjunctivae normal.     Pupils: Pupils are equal, round, and reactive to light.  Cardiovascular:     Rate and Rhythm: Normal rate.  Pulmonary:     Effort: Pulmonary effort is normal.  Abdominal:     Tenderness: There is no right CVA tenderness or left CVA tenderness.  Skin:    General: Skin is warm and dry.  Neurological:     General: No focal deficit present.     Mental Status: She is alert and oriented to person, place, and time.  Psychiatric:        Mood and Affect: Mood normal.        Behavior: Behavior normal.        Thought Content: Thought content normal.        Judgment: Judgment normal.     Results for orders placed or performed during the hospital encounter of 12/02/19 (from the past 24 hour(s))  POCT Urinalysis Dipstick (ED/UC)     Status: Abnormal   Collection Time: 12/02/19  2:49 PM  Result Value Ref  Range   Glucose, UA NEGATIVE NEGATIVE mg/dL   Bilirubin Urine SMALL (A) NEGATIVE   Ketones, ur NEGATIVE NEGATIVE mg/dL   Specific Gravity,  Urine 1.025 1.005 - 1.030   Hgb urine dipstick LARGE (A) NEGATIVE   pH 5.5 5.0 - 8.0   Protein, ur 30 (A) NEGATIVE mg/dL   Urobilinogen, UA 0.2 0.0 - 1.0 mg/dL   Nitrite NEGATIVE NEGATIVE   Leukocytes,Ua SMALL (A) NEGATIVE    Assessment and Plan :   PDMP not reviewed this encounter.  1. Acute cystitis with hematuria   2. Dysuria   3. Urinary frequency     Start Keflex to cover for acute cystitis, urine culture pending.  Recommended aggressive hydration, limiting urinary irritants. Counseled patient on potential for adverse effects with medications prescribed/recommended today, ER and return-to-clinic precautions discussed, patient verbalized understanding.    Jaynee Eagles, PA-C 12/02/19 1600

## 2019-12-02 NOTE — ED Triage Notes (Signed)
Pt reports burning sensation when urinating and lower back painx 3 days, Denies nausea, chills, fever.

## 2019-12-02 NOTE — Discharge Instructions (Signed)
Make sure you hydrate very well with plain water and a quantity of 64 ounces of water a day.  Please limit drinks that are considered urinary irritants such as soda, sweet tea, coffee, energy drinks, alcohol.  These can worsen your UTI symptoms and also be the source of them.  I will let you know about your urine culture results through MyChart to see if we need to change your antibiotics based off of those results.

## 2019-12-02 NOTE — Telephone Encounter (Signed)
Patient called after being discharged and requested nausea medication.  Prescription sent for Zofran.

## 2019-12-03 LAB — URINE CULTURE: Culture: <10000 — AB

## 2019-12-09 ENCOUNTER — Ambulatory Visit: Payer: BC Managed Care – PPO | Admitting: Internal Medicine

## 2019-12-19 ENCOUNTER — Encounter (HOSPITAL_COMMUNITY): Payer: Self-pay

## 2019-12-19 ENCOUNTER — Other Ambulatory Visit: Payer: Self-pay

## 2019-12-19 ENCOUNTER — Inpatient Hospital Stay (HOSPITAL_COMMUNITY)
Admission: EM | Admit: 2019-12-19 | Discharge: 2019-12-23 | DRG: 897 | Disposition: A | Discharge status: Home / Self Care | Payer: BC Managed Care – PPO | Attending: Internal Medicine | Admitting: Internal Medicine

## 2019-12-19 DIAGNOSIS — K219 Gastro-esophageal reflux disease without esophagitis: Secondary | ICD-10-CM | POA: Diagnosis not present

## 2019-12-19 DIAGNOSIS — F10232 Alcohol dependence with withdrawal with perceptual disturbance: Secondary | ICD-10-CM | POA: Diagnosis not present

## 2019-12-19 DIAGNOSIS — Z888 Allergy status to other drugs, medicaments and biological substances status: Secondary | ICD-10-CM

## 2019-12-19 DIAGNOSIS — F10239 Alcohol dependence with withdrawal, unspecified: Secondary | ICD-10-CM | POA: Diagnosis present

## 2019-12-19 DIAGNOSIS — F10231 Alcohol dependence with withdrawal delirium: Secondary | ICD-10-CM | POA: Diagnosis not present

## 2019-12-19 DIAGNOSIS — Z825 Family history of asthma and other chronic lower respiratory diseases: Secondary | ICD-10-CM

## 2019-12-19 DIAGNOSIS — R7302 Impaired glucose tolerance (oral): Secondary | ICD-10-CM | POA: Diagnosis present

## 2019-12-19 DIAGNOSIS — R441 Visual hallucinations: Secondary | ICD-10-CM | POA: Diagnosis present

## 2019-12-19 DIAGNOSIS — J4530 Mild persistent asthma, uncomplicated: Secondary | ICD-10-CM | POA: Diagnosis not present

## 2019-12-19 DIAGNOSIS — Z87891 Personal history of nicotine dependence: Secondary | ICD-10-CM | POA: Diagnosis not present

## 2019-12-19 DIAGNOSIS — Z6841 Body Mass Index (BMI) 40.0 and over, adult: Secondary | ICD-10-CM

## 2019-12-19 DIAGNOSIS — Z91040 Latex allergy status: Secondary | ICD-10-CM | POA: Diagnosis not present

## 2019-12-19 DIAGNOSIS — Z823 Family history of stroke: Secondary | ICD-10-CM

## 2019-12-19 DIAGNOSIS — J45909 Unspecified asthma, uncomplicated: Secondary | ICD-10-CM | POA: Diagnosis not present

## 2019-12-19 DIAGNOSIS — Z96653 Presence of artificial knee joint, bilateral: Secondary | ICD-10-CM | POA: Diagnosis present

## 2019-12-19 DIAGNOSIS — Z7901 Long term (current) use of anticoagulants: Secondary | ICD-10-CM

## 2019-12-19 DIAGNOSIS — Z86711 Personal history of pulmonary embolism: Secondary | ICD-10-CM | POA: Diagnosis not present

## 2019-12-19 DIAGNOSIS — Z79899 Other long term (current) drug therapy: Secondary | ICD-10-CM

## 2019-12-19 DIAGNOSIS — F10932 Alcohol use, unspecified with withdrawal with perceptual disturbance: Secondary | ICD-10-CM

## 2019-12-19 DIAGNOSIS — F10129 Alcohol abuse with intoxication, unspecified: Secondary | ICD-10-CM | POA: Diagnosis not present

## 2019-12-19 DIAGNOSIS — Z20822 Contact with and (suspected) exposure to covid-19: Secondary | ICD-10-CM | POA: Diagnosis not present

## 2019-12-19 DIAGNOSIS — Z8719 Personal history of other diseases of the digestive system: Secondary | ICD-10-CM

## 2019-12-19 DIAGNOSIS — Z91013 Allergy to seafood: Secondary | ICD-10-CM | POA: Diagnosis not present

## 2019-12-19 DIAGNOSIS — Z885 Allergy status to narcotic agent status: Secondary | ICD-10-CM

## 2019-12-19 DIAGNOSIS — E785 Hyperlipidemia, unspecified: Secondary | ICD-10-CM | POA: Diagnosis not present

## 2019-12-19 DIAGNOSIS — I10 Essential (primary) hypertension: Secondary | ICD-10-CM | POA: Diagnosis present

## 2019-12-19 DIAGNOSIS — R42 Dizziness and giddiness: Secondary | ICD-10-CM | POA: Diagnosis not present

## 2019-12-19 HISTORY — DX: Body Mass Index (BMI) 40.0 and over, adult: Z684

## 2019-12-19 HISTORY — DX: Alcohol dependence, uncomplicated: F10.20

## 2019-12-19 HISTORY — DX: Morbid (severe) obesity due to excess calories: E66.01

## 2019-12-19 LAB — I-STAT BETA HCG BLOOD, ED (MC, WL, AP ONLY): I-stat hCG, quantitative: 7.5 m[IU]/mL — ABNORMAL HIGH (ref <5)

## 2019-12-19 LAB — URINALYSIS, ROUTINE W REFLEX MICROSCOPIC
Bacteria, UA: NONE SEEN
Bilirubin Urine: NEGATIVE
Glucose, UA: NEGATIVE mg/dL
Ketones, ur: NEGATIVE mg/dL
Nitrite: NEGATIVE
Protein, ur: NEGATIVE mg/dL
Specific Gravity, Urine: 1.021 (ref 1.005–1.030)
pH: 5 (ref 5.0–8.0)

## 2019-12-19 LAB — HEPATIC FUNCTION PANEL
ALT: 99 U/L — ABNORMAL HIGH (ref 0–44)
AST: 104 U/L — ABNORMAL HIGH (ref 15–41)
Albumin: 3.5 g/dL (ref 3.5–5.0)
Alkaline Phosphatase: 99 U/L (ref 38–126)
Bilirubin, Direct: 0.2 mg/dL (ref 0.0–0.2)
Indirect Bilirubin: 0.7 mg/dL (ref 0.3–0.9)
Total Bilirubin: 0.9 mg/dL (ref 0.3–1.2)
Total Protein: 7.9 g/dL (ref 6.5–8.1)

## 2019-12-19 LAB — CBC
HCT: 47.8 % — ABNORMAL HIGH (ref 36.0–46.0)
Hemoglobin: 15.3 g/dL — ABNORMAL HIGH (ref 12.0–15.0)
MCH: 29.1 pg (ref 26.0–34.0)
MCHC: 32 g/dL (ref 30.0–36.0)
MCV: 91 fL (ref 80.0–100.0)
Platelets: 279 10*3/uL (ref 150–400)
RBC: 5.25 MIL/uL — ABNORMAL HIGH (ref 3.87–5.11)
RDW: 13.7 % (ref 11.5–15.5)
WBC: 9.5 10*3/uL (ref 4.0–10.5)
nRBC: 0 % (ref 0.0–0.2)

## 2019-12-19 LAB — BASIC METABOLIC PANEL
Anion gap: 13 (ref 5–15)
BUN: 13 mg/dL (ref 6–20)
CO2: 26 mmol/L (ref 22–32)
Calcium: 9.1 mg/dL (ref 8.9–10.3)
Chloride: 99 mmol/L (ref 98–111)
Creatinine, Ser: 0.9 mg/dL (ref 0.44–1.00)
GFR, Estimated: >60 mL/min (ref >60)
Glucose, Bld: 107 mg/dL — ABNORMAL HIGH (ref 70–99)
Potassium: 3.9 mmol/L (ref 3.5–5.1)
Sodium: 138 mmol/L (ref 135–145)

## 2019-12-19 LAB — MAGNESIUM: Magnesium: 2 mg/dL (ref 1.7–2.4)

## 2019-12-19 LAB — CBG MONITORING, ED
Glucose-Capillary: 190 mg/dL — ABNORMAL HIGH (ref 70–99)
Glucose-Capillary: 79 mg/dL (ref 70–99)
Glucose-Capillary: 94 mg/dL (ref 70–99)
Glucose-Capillary: 96 mg/dL (ref 70–99)
Glucose-Capillary: 123 mg/dL — ABNORMAL HIGH (ref 70–99)

## 2019-12-19 LAB — RESP PANEL BY RT PCR (RSV, FLU A&B, COVID)
Influenza A by PCR: NEGATIVE
Influenza B by PCR: NEGATIVE
Respiratory Syncytial Virus by PCR: NEGATIVE
SARS Coronavirus 2 by RT PCR: NEGATIVE

## 2019-12-19 LAB — PHOSPHORUS: Phosphorus: 3.4 mg/dL (ref 2.5–4.6)

## 2019-12-19 MED ORDER — HYDRALAZINE HCL 20 MG/ML IJ SOLN
5.0000 mg | INTRAMUSCULAR | Status: DC | PRN
  Administered 2019-12-20: 5 mg via INTRAVENOUS
  Filled 2019-12-19: qty 1

## 2019-12-19 MED ORDER — THIAMINE HCL 100 MG/ML IJ SOLN: 100.0000 mg | Freq: Every day | INTRAMUSCULAR | Status: DC

## 2019-12-19 MED ORDER — THIAMINE HCL 100 MG/ML IJ SOLN
100.0000 mg | Freq: Every day | INTRAMUSCULAR | Status: DC
  Administered 2019-12-23: 100 mg via INTRAVENOUS
  Filled 2019-12-19: qty 2

## 2019-12-19 MED ORDER — LOSARTAN POTASSIUM-HCTZ 100-25 MG PO TABS: 1.0000 | ORAL_TABLET | Freq: Every day | ORAL | Status: DC

## 2019-12-19 MED ORDER — THIAMINE HCL 100 MG PO TABS
100.0000 mg | ORAL_TABLET | Freq: Every day | ORAL | Status: DC
  Administered 2019-12-19: 100 mg via ORAL
  Filled 2019-12-19: qty 1

## 2019-12-19 MED ORDER — ONDANSETRON HCL 4 MG PO TABS
4.0000 mg | ORAL_TABLET | Freq: Four times a day (QID) | ORAL | Status: DC | PRN
  Filled 2019-12-19: qty 1

## 2019-12-19 MED ORDER — THIAMINE HCL 100 MG PO TABS
100.0000 mg | ORAL_TABLET | Freq: Every day | ORAL | Status: DC
  Administered 2019-12-20 – 2019-12-22 (×3): 100 mg via ORAL
  Filled 2019-12-19 (×4): qty 1

## 2019-12-19 MED ORDER — LORAZEPAM 2 MG/ML IJ SOLN: 0.0000 mg | Freq: Four times a day (QID) | INTRAMUSCULAR | Status: AC

## 2019-12-19 MED ORDER — FAMOTIDINE 20 MG PO TABS
20.0000 mg | ORAL_TABLET | Freq: Every day | ORAL | Status: DC
  Administered 2019-12-19 – 2019-12-23 (×5): 20 mg via ORAL
  Filled 2019-12-19 (×5): qty 1

## 2019-12-19 MED ORDER — BISACODYL 5 MG PO TBEC: 5.0000 mg | DELAYED_RELEASE_TABLET | Freq: Every day | ORAL | Status: DC | PRN

## 2019-12-19 MED ORDER — SODIUM CHLORIDE 0.9% FLUSH
3.0000 mL | Freq: Two times a day (BID) | INTRAVENOUS | Status: DC
  Administered 2019-12-19 – 2019-12-23 (×5): 3 mL via INTRAVENOUS

## 2019-12-19 MED ORDER — LORAZEPAM 2 MG/ML IJ SOLN
1.0000 mg | INTRAMUSCULAR | Status: DC | PRN
  Administered 2019-12-19: 1 mg via INTRAVENOUS

## 2019-12-19 MED ORDER — ACETAMINOPHEN 650 MG RE SUPP: 650.0000 mg | Freq: Four times a day (QID) | RECTAL | Status: DC | PRN

## 2019-12-19 MED ORDER — CLONIDINE HCL 0.1 MG PO TABS
0.1000 mg | ORAL_TABLET | Freq: Two times a day (BID) | ORAL | Status: DC
  Administered 2019-12-19 – 2019-12-23 (×9): 0.1 mg via ORAL
  Filled 2019-12-19 (×9): qty 1

## 2019-12-19 MED ORDER — DIAZEPAM 5 MG PO TABS
5.0000 mg | ORAL_TABLET | Freq: Four times a day (QID) | ORAL | Status: DC
  Administered 2019-12-19 – 2019-12-20 (×4): 5 mg via ORAL
  Filled 2019-12-19 (×5): qty 1

## 2019-12-19 MED ORDER — LORAZEPAM 2 MG/ML IJ SOLN
1.0000 mg | Freq: Once | INTRAMUSCULAR | Status: AC
  Administered 2019-12-19: 1 mg via INTRAVENOUS
  Filled 2019-12-19: qty 1

## 2019-12-19 MED ORDER — INSULIN ASPART 100 UNIT/ML ~~LOC~~ SOLN
0.0000 [IU] | Freq: Three times a day (TID) | SUBCUTANEOUS | Status: DC
  Administered 2019-12-22: 2 [IU] via SUBCUTANEOUS

## 2019-12-19 MED ORDER — ONDANSETRON HCL 4 MG/2ML IJ SOLN: 4.0000 mg | Freq: Four times a day (QID) | INTRAMUSCULAR | Status: DC | PRN

## 2019-12-19 MED ORDER — LORAZEPAM 1 MG PO TABS
1.0000 mg | ORAL_TABLET | ORAL | Status: AC | PRN
  Administered 2019-12-21 (×2): 1 mg via ORAL
  Filled 2019-12-19 (×2): qty 1

## 2019-12-19 MED ORDER — DEXTROSE 50 % IV SOLN
1.0000 | Freq: Once | INTRAVENOUS | Status: AC
  Administered 2019-12-19: 50 mL via INTRAVENOUS
  Filled 2019-12-19: qty 50

## 2019-12-19 MED ORDER — LORAZEPAM 2 MG/ML IJ SOLN: 1.0000 mg | INTRAMUSCULAR | Status: AC | PRN

## 2019-12-19 MED ORDER — FOLIC ACID 1 MG PO TABS
1.0000 mg | ORAL_TABLET | Freq: Every day | ORAL | Status: DC
  Administered 2019-12-19: 1 mg via ORAL
  Filled 2019-12-19: qty 1

## 2019-12-19 MED ORDER — LORAZEPAM 1 MG PO TABS: 1.0000 mg | ORAL_TABLET | ORAL | Status: DC | PRN

## 2019-12-19 MED ORDER — HYDROCHLOROTHIAZIDE 25 MG PO TABS
25.0000 mg | ORAL_TABLET | Freq: Every day | ORAL | Status: DC
  Administered 2019-12-20 – 2019-12-23 (×4): 25 mg via ORAL
  Filled 2019-12-19 (×5): qty 1

## 2019-12-19 MED ORDER — ADULT MULTIVITAMIN W/MINERALS CH
1.0000 | ORAL_TABLET | Freq: Every day | ORAL | Status: DC
  Administered 2019-12-19: 1 via ORAL
  Filled 2019-12-19: qty 1

## 2019-12-19 MED ORDER — FLUTICASONE FUROATE-VILANTEROL 200-25 MCG/INH IN AEPB
1.0000 | INHALATION_SPRAY | Freq: Every day | RESPIRATORY_TRACT | Status: DC
  Administered 2019-12-21 – 2019-12-23 (×3): 1 via RESPIRATORY_TRACT
  Filled 2019-12-19 (×2): qty 28

## 2019-12-19 MED ORDER — SODIUM CHLORIDE 0.9 % IV BOLUS
1000.0000 mL | Freq: Once | INTRAVENOUS | Status: AC
  Administered 2019-12-19: 1000 mL via INTRAVENOUS

## 2019-12-19 MED ORDER — DOCUSATE SODIUM 100 MG PO CAPS
100.0000 mg | ORAL_CAPSULE | Freq: Two times a day (BID) | ORAL | Status: DC
  Administered 2019-12-20: 100 mg via ORAL
  Filled 2019-12-19: qty 1

## 2019-12-19 MED ORDER — APIXABAN 5 MG PO TABS
5.0000 mg | ORAL_TABLET | Freq: Two times a day (BID) | ORAL | Status: DC
  Administered 2019-12-19 – 2019-12-23 (×9): 5 mg via ORAL
  Filled 2019-12-19 (×9): qty 1

## 2019-12-19 MED ORDER — LACTATED RINGERS IV SOLN: INTRAVENOUS | Status: DC

## 2019-12-19 MED ORDER — FOLIC ACID 1 MG PO TABS
1.0000 mg | ORAL_TABLET | Freq: Every day | ORAL | Status: DC
  Administered 2019-12-20 – 2019-12-23 (×4): 1 mg via ORAL
  Filled 2019-12-19 (×4): qty 1

## 2019-12-19 MED ORDER — LORAZEPAM 2 MG/ML IJ SOLN
0.0000 mg | Freq: Two times a day (BID) | INTRAMUSCULAR | Status: AC
  Administered 2019-12-22: 2 mg via INTRAVENOUS
  Filled 2019-12-19: qty 1

## 2019-12-19 MED ORDER — ALBUTEROL SULFATE HFA 108 (90 BASE) MCG/ACT IN AERS
2.0000 | INHALATION_SPRAY | Freq: Four times a day (QID) | RESPIRATORY_TRACT | Status: DC | PRN
  Filled 2019-12-19: qty 6.7

## 2019-12-19 MED ORDER — POLYETHYLENE GLYCOL 3350 17 G PO PACK: 17.0000 g | PACK | Freq: Every day | ORAL | Status: DC | PRN

## 2019-12-19 MED ORDER — ADULT MULTIVITAMIN W/MINERALS CH
1.0000 | ORAL_TABLET | Freq: Every day | ORAL | Status: DC
  Administered 2019-12-20 – 2019-12-23 (×4): 1 via ORAL
  Filled 2019-12-19 (×4): qty 1

## 2019-12-19 MED ORDER — ACETAMINOPHEN 325 MG PO TABS: 650.0000 mg | ORAL_TABLET | Freq: Four times a day (QID) | ORAL | Status: DC | PRN

## 2019-12-19 MED ORDER — LOSARTAN POTASSIUM 50 MG PO TABS
100.0000 mg | ORAL_TABLET | Freq: Every day | ORAL | Status: DC
  Administered 2019-12-20 – 2019-12-23 (×4): 100 mg via ORAL
  Filled 2019-12-19 (×4): qty 2

## 2019-12-19 NOTE — ED Notes (Signed)
Patient ambulated to restroom.

## 2019-12-19 NOTE — ED Triage Notes (Signed)
Pt states that she was on her way to work and got dizzy feeling like she was going to pass out.

## 2019-12-19 NOTE — ED Notes (Signed)
CBG rechecked after administration of dextrose IV. CBG 190. Physician notified.

## 2019-12-19 NOTE — H&P (Signed)
History and Physical    [name] PNT:[id number] DOB: April 12, 1960 DOA: 12/19/2019  PCP: Biagio Borg, MD Consultants:  [facility]; [facility] - pulmonology  Patient coming from:  Home - lives with husband; NOK: Husband, [phone]  Chief Complaint: tremor  HPI: [name] is a 59 y.o. female with medical history significant of morbid obesity (BMI 47.24); HTN; HLD; alcohol dependence; and h/o PE on Eliquis presenting with alcohol withdrawal.  She was previously hospitalized for this issue from 8/13-19 with DTs.  She stopped drinking for about 2 months about that admission and then restarted.  She reports that she stopped drinking for 2 months ago and fell off the wagon 2 weeks ago and drinking ever since.  She is drinking about a fifth a day.  She was off alcohol for about 6 months after having knee replacement.  She drank excessively during COVID.  She is feeling very tremulous, "they thought I was having a seizure", it's never been this bad."  She is having hallucinations - seeing bugs on the wall, seeing something in the light.  No known h/o seizures.  She wants to stop drinking again.  She has never been involved with AA.    ED Course:  Relapsed 1-2 weeks ago, last drink yesterday (1/5 liquor).  +nausea, dizziness, elevated LFTs.  Giving Ativan.  Persistent tachycardia.  Review of Systems: As per HPI; otherwise review of systems reviewed and negative.   Ambulatory Status:  Ambulates without assistance  COVID Vaccine Status:   Complete  Past Medical History:  Diagnosis Date  . Alcohol dependence ([place])   . Asthma   . COLONIC POLYPS, HX OF 04/05/2010  . DIVERTICULITIS, HX OF 04/05/2010  . DJD (degenerative joint disease)    right knee, mot to severe  . GERD (gastroesophageal reflux disease)    no meds  . Heart murmur    hx of   . Hyperlipidemia   . Hypertension   . Impaired glucose tolerance 12/06/2013  . Morbid obesity with BMI of 45.0-49.9, adult ([place])   .  PALPITATIONS, HX OF 09/14/2007    Past Surgical History:  Procedure Laterality Date  . ABDOMINAL HYSTERECTOMY  age 7   fibroids  . COLONOSCOPY WITH PROPOFOL N/A 07/25/2016   Procedure: COLONOSCOPY WITH PROPOFOL;  Surgeon: Carol Ada, MD;  Location: [facility];  Service: Endoscopy;  Laterality: N/A;  . colonscopy     x 2  . IR RADIOLOGIST EVAL & MGMT  08/12/2016  . IR RADIOLOGIST EVAL & MGMT  08/21/2016  . KNEE ARTHROSCOPY     left   . TOTAL KNEE ARTHROPLASTY  07/29/2011   Procedure: TOTAL KNEE ARTHROPLASTY;  Surgeon: Mauri Pole, MD;  Location: [facility];  Service: Orthopedics;  Laterality: Right;  . TOTAL KNEE ARTHROPLASTY Left 12/14/2012   Procedure: LEFT TOTAL KNEE ARTHROPLASTY;  Surgeon: Mauri Pole, MD;  Location: [facility];  Service: Orthopedics;  Laterality: Left;    Social History   Socioeconomic History  . Marital status: Married    Spouse name: Not on file  . Number of children: 1  . Years of education: Not on file  . Highest education level: Not on file  Occupational History  . Occupation: book Information systems manager: [employer]  Tobacco Use  . Smoking status: Former Smoker    Packs/day: 0.50    Years: 7.00    Pack years: 3.50    Types: Cigarettes    Quit date: 02/11/1988  Years since quitting: 31.8  . Smokeless tobacco: Never Used  Vaping Use  . Vaping Use: Never used  Substance and Sexual Activity  . Alcohol use: Yes    Comment: dependence  . Drug use: Not Currently    Types: Marijuana    Comment: smoked marijuana x 10 years.  Quit in 1985.  Marland Kitchen Sexual activity: Yes    Birth control/protection: Surgical    Comment: 1st intercourse 59 yo-Fewer than 5 partners  Other Topics Concern  . Not on file  Social History Narrative   Environmental consultant on file.  [facility] April 05, 2010 12:04 PM   Social Determinants of Health   Financial Resource Strain:   . Difficulty of Paying Living Expenses: Not on file  Food Insecurity:   . Worried  About Charity fundraiser in the Last Year: Not on file  . Ran Out of Food in the Last Year: Not on file  Transportation Needs:   . Lack of Transportation (Medical): Not on file  . Lack of Transportation (Non-Medical): Not on file  Physical Activity:   . Days of Exercise per Week: Not on file  . Minutes of Exercise per Session: Not on file  Stress:   . Feeling of Stress : Not on file  Social Connections:   . Frequency of Communication with Friends and Family: Not on file  . Frequency of Social Gatherings with Friends and Family: Not on file  . Attends Religious Services: Not on file  . Active Member of Clubs or Organizations: Not on file  . Attends Archivist Meetings: Not on file  . Marital Status: Not on file  Intimate Partner Violence:   . Fear of Current or Ex-Partner: Not on file  . Emotionally Abused: Not on file  . Physically Abused: Not on file  . Sexually Abused: Not on file    Allergies  Allergen Reactions  . Morphine Itching  . Shrimp [Shellfish Allergy] Itching and Other (See Comments)    Tongue burns also  . Tramadol Other (See Comments)    Caused confusion  . Latex Rash    Family History  Problem Relation Age of Onset  . Stroke Mother   . COPD Father   . Lymphoma Sister     Prior to Admission medications   Medication Sig Start Date End Date Taking? Authorizing Provider  ADVAIR HFA 115-21 MCG/ACT inhaler Inhale 1 puff into the lungs 2 (two) times daily. 11/15/19  Yes [provider]  albuterol (PROAIR HFA) 108 (90 Base) MCG/ACT inhaler INHALE 1 TO 2 PUFFS BY MOUTH EVERY 6 HOURS AS NEEDED FOR WHEEZE Patient taking differently: Inhale 2 puffs into the lungs every 6 (six) hours as needed for shortness of breath.  01/14/19  Yes Tanda Rockers, MD  apixaban (ELIQUIS) 5 MG TABS tablet Take 1 tablet (5 mg total) by mouth 2 (two) times daily. 01/14/19  Yes Tanda Rockers, MD  atorvastatin (LIPITOR) 20 MG tablet Take 1 tablet (20 mg total) by mouth  daily. Hold until your liver enzymes have normalized. 09/29/19 09/28/20 Yes Mikhail, Velta Addison, DO  cimetidine (TAGAMET) 200 MG tablet Take 200 mg by mouth daily.    Yes [provider]  cloNIDine (CATAPRES) 0.1 MG tablet Take 1 tablet (0.1 mg total) by mouth 2 (two) times daily. 09/29/19  Yes Mikhail, Maryann, DO  KLOR-CON M20 20 MEQ tablet TAKE 1 TABLET BY MOUTH EVERY DAY Patient taking differently: Take 10 mEq by mouth every  other day.  04/26/19  Yes Biagio Borg, MD  losartan-hydrochlorothiazide (HYZAAR) 100-25 MG tablet TAKE 1 TABLET BY MOUTH EVERY DAY Patient taking differently: Take 1 tablet by mouth daily.  07/25/19  Yes Biagio Borg, MD  ondansetron (ZOFRAN-ODT) 8 MG disintegrating tablet Take 1 tablet (8 mg total) by mouth every 8 (eight) hours as needed for nausea or vomiting. 12/02/19  Yes Jaynee Eagles, PA-C  aspirin EC 81 MG EC tablet Take 1 tablet (81 mg total) by mouth daily. Swallow whole. Patient not taking: Reported on 12/19/2019 09/30/19   Cristal Ford, DO  cephALEXin (KEFLEX) 500 MG capsule Take 1 capsule (500 mg total) by mouth 2 (two) times daily. Patient not taking: Reported on 12/19/2019 12/02/19   Jaynee Eagles, PA-C  hydrOXYzine (ATARAX/VISTARIL) 25 MG tablet Take 1 tablet (25 mg total) by mouth 3 (three) times daily as needed for anxiety. Patient not taking: Reported on 12/19/2019 09/29/19   Cristal Ford, DO  methocarbamol (ROBAXIN) 500 MG tablet Take 1 tablet (500 mg total) by mouth 4 (four) times daily. Patient not taking: Reported on 12/19/2019 07/21/19   Biagio Borg, MD  Multiple Vitamin (MULTIVITAMIN WITH MINERALS) TABS tablet Take 1 tablet by mouth daily. Patient not taking: Reported on 12/19/2019 09/30/19   Cristal Ford, DO  thiamine 100 MG tablet Take 1 tablet (100 mg total) by mouth daily. Patient not taking: Reported on 12/19/2019 09/30/19   Cristal Ford, DO    Physical Exam: Vitals:   12/19/19 0845 12/19/19 0858 12/19/19 0900 12/19/19 0915  BP:  127/86  131/75 140/74  Pulse: (!) 117  (!) 114 (!) 116  Resp: 20  (!) 21 (!) 28  Temp:      TempSrc:      SpO2: 93%  95% 95%  Weight:  113.4 kg    Height:  5\' 1"  (1.549 m)       . General:  Appears mildly anxious and is NAD . Eyes:  PERRL, EOMI, normal lids, iris . ENT:  grossly normal hearing, lips & tongue, mmm; appropriate dentition . Neck:  no LAD, masses or thyromegaly . Cardiovascular:  RR with tachycardia, no m/r/g. No LE edema.  Marland Kitchen Respiratory:   CTA bilaterally with no wheezes/rales/rhonchi.  Normal respiratory effort. . Abdomen:  soft, NT, ND, NABS ,large pannus . Skin:  no rash or induration seen on limited exam . Musculoskeletal:  grossly normal tone BUE/BLE, good ROM, no bony abnormality . Psychiatric:  Mildly anxious mood and affect, speech fluent and appropriate with periodic word finding difficulty, AOx3 . Neurologic:  CN 2-12 grossly intact, moves all extremities in coordinated fashion, tremulous    Radiological Exams on Admission: Independently reviewed - see discussion in A/P where applicable  No results found.  EKG: Independently reviewed.  NSR with rate 97; nonspecific ST changes with no evidence of acute ischemia   Labs on Admission: I have personally reviewed the available labs and imaging studies at the time of the admission.  Pertinent labs:   Glucose 107, 79, 190 AST 104/ALT 99 WBC 9.5 Hgb 15.3 HCG 7.5 UA: small Hgb, small LE   Assessment/Plan Principal Problem:   Alcohol dependence with withdrawal (HCC) Active Problems:   Essential hypertension   Extrinsic asthma   Impaired glucose tolerance   Obesity, Class III, BMI 40-49.9 (morbid obesity) (HCC)   Hyperlipidemia   History of pulmonary embolism   Alcohol dependence with withdrawal -Patient with chronic ETOH dependence -Number of drinks per day: one fifth liquor -Number  of admissions for management of alcohol withdrawal syndrome (detox): 1 (plus ER visit) -History of withdrawal  seizures, ICU admissions, DTs:  DTs x 1 -Patient is exhibiting active s/sx of withdrawal, CIWA score is pending (nurse asked to calculate and record - but patient tremulous and anxious on exam with reported visual hallucinations) -She is at high risk for complications of withdrawal including seizures, DTs -Will observe on telemetry for now -CIWA protocol -Folate, thiamine, and MVI ordered -Will provide fixed-dose (Valium 5 mg PO q6h) and also symptom-triggered BZD (Ativan per CIWA protocol) given her h/o severe withdrawal and/or current signs of severe withdrawal. -TOC team consult for possible inpatient treatment -Elevated LFTs are likely related to alcoholism -Consider offering a medication for Alcohol Use Disorder at the time of d/c, to include Disulfuram; Naltrexone; or Acamprosate.  History of pulmonary embolism -Appears to have had first lifetime VTE event in 5/20 -Continue Eliquis -May be able to come off [facility] at some point but will defer to PCP  Essential hypertension -Continue hyzaar, clonidine  Impaired glucose tolerance -A1c was 6.4 on 07/21/19 -Will cover with moderate-scale SSI during hospitalization  Asthma -Continue Advair ([place] substitution) and prn Albuterol HFA  HLD -Continue Lipitor  Morbid obesity -Body mass index is 47.24 kg/m. -Weight loss should be encouraged -Outpatient PCP/bariatric medicine/bariatric surgery f/u encouraged    Note: This patient has been tested and is negative for the novel coronavirus COVID-19. The patient has been fully vaccinated against COVID-19.    DVT prophylaxis: Eliquis Code Status:  Full - confirmed with patient Family Communication: None present Disposition Plan:  The patient is from: home  Anticipated d/c is to: home without [facility] services  Anticipated d/c date will depend on clinical response to treatment, but possibly as early as tomorrow if she has excellent response to treatment  Patient is  currently: acutely ill Consults called: TOC team; nutrition Admission status:  It is my clinical opinion that referral for OBSERVATION is reasonable and necessary in this patient based on the above information provided. The aforementioned taken together are felt to place the patient at high risk for further clinical deterioration. However it is anticipated that the patient may be medically stable for discharge from the hospital within 24 to 48 hours.    Karmen Bongo MD [facility]   How to contact the [facility] Attending or Consulting provider [place] or covering provider during after hours [employer], for this patient?  1. Check the care team in [facility] and look for a) attending/consulting [facility] provider listed and b) the [facility] team listed 2. Log into [email or web address] and use [place]'s universal password to access. If you do not have the password, please contact the hospital operator. 3. Locate the [facility] provider you are looking for under [facility] and page to a number that you can be directly reached. 4. If you still have difficulty reaching the provider, please page the [facility] (Director on Call) for the Hospitalists listed on amion for assistance.   12/19/2019, 10:17 AM

## 2019-12-19 NOTE — ED Provider Notes (Addendum)
[facility] EMERGENCY DEPARTMENT Provider Note   CSN: [id number] Arrival date & time: 12/19/19  0430     History Chief Complaint  Patient presents with  . Dizziness    EMNET MONK is a 59 y.o. female with past medical history significant for alcohol abuse, asthma, degenerative joint disease, GERD, hyperlipidemia, hypertension, palpitations.  HPI She presents to emergency department today with chief complaint of dizziness x 1 day.  Patient states she recently started drinking again after having been sober for 2 months.  She has been drinking daily for 2 weeks, her last drink was yesterday when she drank 1/5 of liquor.  She states she took this morning and was trying to get ready for work however felt very dizzy.  She sat on her porch to see if that would resolve her symptoms however dizziness worsened.  She states she was seeing things while sitting on the porch that she knows were not actually there.  She describes the dizziness as feeling like everything was spinning. She became scared and told her husband she need to go to the ER.  She has noted a tremor in her bilateral upper extremities that she feels like she cannot control.  She states she has been admitted to the hospital for liver problems in the past and is worried that is what is happening right now.  She is also endorsing nausea and took Zofran prior to arrival.  She has had decreased PO intake over the last week secondary to binge drinking. Denies auditory hallucinations.  Denies any suicidal or homicidal ideations.  She denies any recent fall, fever, chills, chest pain, shortness of breath, abdominal pain, urinary symptoms, diarrhea. Denies history of seizures or DTs however admits to history of alcohol withdrawal.     Past Medical History:  Diagnosis Date  . Alcohol abuse    abuse- moderate years ago - states only drinks now on weekends -2 to 8 driinks   . Asthma   . COLONIC POLYPS, HX OF 04/05/2010  .  DIVERTICULITIS, HX OF 04/05/2010  . DJD (degenerative joint disease)    right knee, mot to severe  . GERD (gastroesophageal reflux disease)    no meds  . Heart murmur    hx of   . Hyperlipidemia   . Hypertension   . Impaired glucose tolerance 12/06/2013  . Obesity (BMI 30-39.9)   . PALPITATIONS, HX OF 09/14/2007    Patient Active Problem List   Diagnosis Date Noted  . Alcohol dependence with withdrawal ([place]) 12/19/2019  . Alcohol withdrawal ([place]) 09/24/2019  . Alcoholic intoxication without complication ([place]) 44/02/270  . Pneumonia due to COVID-19 virus 02/10/2019  . Pulmonary embolism ([place]) 07/06/2018  . Elevated troponin 07/06/2018  . Leukocytosis 09/22/2017  . Abnormal urine color 09/22/2017  . Rash 09/22/2017  . Sepsis ([place]) 09/12/2016  . Colovesical fistula 09/12/2016  . Hypertension 07/28/2016  . Alcohol abuse 07/28/2016  . Obesity (BMI 30-39.9) 07/28/2016  . Abscess of bladder 07/28/2016  . Colonic diverticulum 07/28/2016  . Acute hypokalemia 07/28/2016  . Hyperlipidemia 07/10/2016  . Mass of left side of neck 07/10/2016  . Head and neck lymphadenopathy 07/10/2016  . Acute sinus infection 01/25/2016  . Eustachian tube disorder 01/25/2016  . Asthma exacerbation 05/18/2015  . Peripheral edema 03/16/2015  . Asthma 03/16/2015  . Right shoulder pain 03/16/2015  . Hypokalemia 10/04/2014  . Upper airway cough syndrome 10/03/2014  . Obesity, Class III, BMI 40-49.9 (morbid obesity) ([place]) 10/03/2014  . Lower  back pain 05/25/2014  . Bilateral shoulder pain 05/25/2014  . Chest pain 04/14/2014  . Allergic rhinitis 12/06/2013  . Impaired glucose tolerance 12/06/2013  . Expected blood loss anemia 12/16/2012  . Morbid obesity ([place]) 12/15/2012  . S/P left TKA 12/14/2012  . Goiter 11/26/2012  . Preop exam for internal medicine 11/26/2012  . Diarrhea 02/25/2012  . Vaginal bleeding 04/30/2011  . Colon polyps 02/10/2011  . Eczema 02/10/2011  . Depression 02/10/2011  .  Preventative health care 09/27/2010  . Anxiety state 04/05/2010  . DIVERTICULITIS, HX OF 04/05/2010  . PALPITATIONS, HX OF 09/14/2007  . Obesity 04/24/2007  . VOCAL CORD DISORDER 04/24/2007  . Extrinsic asthma 04/24/2007  . GERD 04/24/2007    Past Surgical History:  Procedure Laterality Date  . ABDOMINAL HYSTERECTOMY  age 53   fibroids  . COLONOSCOPY WITH PROPOFOL N/A 07/25/2016   Procedure: COLONOSCOPY WITH PROPOFOL;  Surgeon: Carol Ada, MD;  Location: [facility];  Service: Endoscopy;  Laterality: N/A;  . colonscopy     x 2  . IR RADIOLOGIST EVAL & MGMT  08/12/2016  . IR RADIOLOGIST EVAL & MGMT  08/21/2016  . KNEE ARTHROSCOPY     left   . TOTAL KNEE ARTHROPLASTY  07/29/2011   Procedure: TOTAL KNEE ARTHROPLASTY;  Surgeon: Mauri Pole, MD;  Location: [facility];  Service: Orthopedics;  Laterality: Right;  . TOTAL KNEE ARTHROPLASTY Left 12/14/2012   Procedure: LEFT TOTAL KNEE ARTHROPLASTY;  Surgeon: Mauri Pole, MD;  Location: [facility];  Service: Orthopedics;  Laterality: Left;     OB History    Gravida  1   Para  1   Term      Preterm      AB      Living  1     SAB      TAB      Ectopic      Multiple      Live Births              Family History  Problem Relation Age of Onset  . Stroke Mother   . COPD Father   . Lymphoma Sister     Social History   Tobacco Use  . Smoking status: Former Smoker    Packs/day: 0.50    Years: 7.00    Pack years: 3.50    Types: Cigarettes    Quit date: 02/11/1988    Years since quitting: 31.8  . Smokeless tobacco: Never Used  Vaping Use  . Vaping Use: Never used  Substance Use Topics  . Alcohol use: Yes    Comment: Occas  . Drug use: No    Comment: smoked marajuania x 10 years.  Quit in 1985.    Home Medications Prior to Admission medications   Medication Sig Start Date End Date Taking? Authorizing Provider  ADVAIR HFA 115-21 MCG/ACT inhaler Inhale 1 puff into the lungs 2 (two) times daily. 11/15/19  Yes  [provider]  albuterol (PROAIR HFA) 108 (90 Base) MCG/ACT inhaler INHALE 1 TO 2 PUFFS BY MOUTH EVERY 6 HOURS AS NEEDED FOR WHEEZE Patient taking differently: Inhale 2 puffs into the lungs every 6 (six) hours as needed for shortness of breath.  01/14/19  Yes Tanda Rockers, MD  apixaban (ELIQUIS) 5 MG TABS tablet Take 1 tablet (5 mg total) by mouth 2 (two) times daily. 01/14/19  Yes Tanda Rockers, MD  atorvastatin (LIPITOR) 20 MG tablet Take 1 tablet (20 mg total) by mouth  daily. Hold until your liver enzymes have normalized. 09/29/19 09/28/20 Yes Mikhail, Velta Addison, DO  cimetidine (TAGAMET) 200 MG tablet Take 200 mg by mouth daily.    Yes [provider]  cloNIDine (CATAPRES) 0.1 MG tablet Take 1 tablet (0.1 mg total) by mouth 2 (two) times daily. 09/29/19  Yes Mikhail, Maryann, DO  KLOR-CON M20 20 MEQ tablet TAKE 1 TABLET BY MOUTH EVERY DAY Patient taking differently: Take 10 mEq by mouth every other day.  04/26/19  Yes Biagio Borg, MD  losartan-hydrochlorothiazide (HYZAAR) 100-25 MG tablet TAKE 1 TABLET BY MOUTH EVERY DAY Patient taking differently: Take 1 tablet by mouth daily.  07/25/19  Yes Biagio Borg, MD  ondansetron (ZOFRAN-ODT) 8 MG disintegrating tablet Take 1 tablet (8 mg total) by mouth every 8 (eight) hours as needed for nausea or vomiting. 12/02/19  Yes Jaynee Eagles, PA-C  aspirin EC 81 MG EC tablet Take 1 tablet (81 mg total) by mouth daily. Swallow whole. Patient not taking: Reported on 12/19/2019 09/30/19   Cristal Ford, DO  cephALEXin (KEFLEX) 500 MG capsule Take 1 capsule (500 mg total) by mouth 2 (two) times daily. Patient not taking: Reported on 12/19/2019 12/02/19   Jaynee Eagles, PA-C  hydrOXYzine (ATARAX/VISTARIL) 25 MG tablet Take 1 tablet (25 mg total) by mouth 3 (three) times daily as needed for anxiety. Patient not taking: Reported on 12/19/2019 09/29/19   Cristal Ford, DO  methocarbamol (ROBAXIN) 500 MG tablet Take 1 tablet (500 mg total) by mouth 4  (four) times daily. Patient not taking: Reported on 12/19/2019 07/21/19   Biagio Borg, MD  Multiple Vitamin (MULTIVITAMIN WITH MINERALS) TABS tablet Take 1 tablet by mouth daily. Patient not taking: Reported on 12/19/2019 09/30/19   Cristal Ford, DO  thiamine 100 MG tablet Take 1 tablet (100 mg total) by mouth daily. Patient not taking: Reported on 12/19/2019 09/30/19   Cristal Ford, DO    Allergies    Morphine, Shrimp [shellfish allergy], Tramadol, and Latex  Review of Systems   Review of Systems All other systems are reviewed and are negative for acute change except as noted in the HPI.  Physical Exam Updated Vital Signs BP 138/64 (BP Location: Left Arm)   Pulse (!) 109   Temp 98.1 F (36.7 C) (Oral)   Resp 18   Ht 5\' 1"  (1.549 m)   SpO2 94%   BMI 59.08 kg/m   Physical Exam Vitals and nursing note reviewed.  Constitutional:      General: She is not in acute distress.    Appearance: She is obese. She is not ill-appearing.  HENT:     Head: Normocephalic and atraumatic.     Right Ear: Tympanic membrane and external ear normal.     Left Ear: Tympanic membrane and external ear normal.     Nose: Nose normal.     Mouth/Throat:     Mouth: Mucous membranes are dry.     Pharynx: Oropharynx is clear.  Eyes:     General: No scleral icterus.       Right eye: No discharge.        Left eye: No discharge.     Extraocular Movements: Extraocular movements intact.     Conjunctiva/sclera: Conjunctivae normal.     Pupils: Pupils are equal, round, and reactive to light.  Neck:     Vascular: No JVD.  Cardiovascular:     Rate and Rhythm: Regular rhythm. Tachycardia present.     Pulses: Normal pulses.  Radial pulses are 2+ on the right side and 2+ on the left side.     Heart sounds: Normal heart sounds.  Pulmonary:     Comments: Lungs clear to auscultation in all fields. Symmetric chest rise. No wheezing, rales, or rhonchi. Abdominal:     Comments: Abdomen is soft,  non-distended, and non-tender in all quadrants. No rigidity, no guarding. No peritoneal signs.  Musculoskeletal:        General: Normal range of motion.     Cervical back: Normal range of motion.  Skin:    General: Skin is warm and dry.     Capillary Refill: Capillary refill takes less than 2 seconds.  Neurological:     Mental Status: She is oriented to person, place, and time.     GCS: GCS eye subscore is 4. GCS verbal subscore is 5. GCS motor subscore is 6.     Comments: Fluent speech, no facial droop. Alert and oriented x4  Tremor seen in bilateral upper extremities without asterixis  Psychiatric:        Attention and Perception: She perceives visual hallucinations. She does not perceive auditory hallucinations.        Mood and Affect: Mood is anxious. Affect is tearful.        Behavior: Behavior normal.        Thought Content: Thought content does not include homicidal or suicidal ideation.     ED Results / Procedures / Treatments   Labs (all labs ordered are listed, but only abnormal results are displayed) Labs Reviewed  BASIC METABOLIC PANEL - Abnormal; Notable for the following components:      Result Value   Glucose, Bld 107 (*)    All other components within normal limits  CBC - Abnormal; Notable for the following components:   RBC 5.25 (*)    Hemoglobin 15.3 (*)    HCT 47.8 (*)    All other components within normal limits  URINALYSIS, ROUTINE W REFLEX MICROSCOPIC - Abnormal; Notable for the following components:   APPearance HAZY (*)    Hgb urine dipstick SMALL (*)    Leukocytes,Ua SMALL (*)    All other components within normal limits  HEPATIC FUNCTION PANEL - Abnormal; Notable for the following components:   AST 104 (*)    ALT 99 (*)    All other components within normal limits  I-STAT BETA HCG BLOOD, ED ([facility], [facility], [facility] ONLY) - Abnormal; Notable for the following components:   I-stat hCG, quantitative 7.5 (*)    All other components within normal limits  CBG  MONITORING, ED - Abnormal; Notable for the following components:   Glucose-Capillary 190 (*)    All other components within normal limits  RESP PANEL BY RT PCR (RSV, FLU A&B, COVID)  MAGNESIUM  PHOSPHORUS  CBG MONITORING, ED    EKG EKG Interpretation  Date/Time:  Monday December 19 2019 04:38:52 EST Ventricular Rate:  97 PR Interval:  148 QRS Duration: 90 QT Interval:  386 QTC Calculation: 490 R Axis:   -21 Text Interpretation: Sinus rhythm with occasional Premature ventricular complexes Cannot rule out Anterior infarct , age undetermined Abnormal ECG given artifact and baseline wander, no obvious changes since august 2021 Confirmed by Merrily Pew [phone]) on 12/19/2019 4:46:16 AM   Radiology No results found.  Procedures Procedures (including critical care time)  Medications Ordered in ED Medications  LORazepam (ATIVAN) tablet 1-4 mg ( Oral See Alternative 12/19/19 0757)    Or  LORazepam (ATIVAN) injection  1-4 mg (1 mg Intravenous Given 12/19/19 0757)  thiamine tablet 100 mg (has no administration in time range)    Or  thiamine (B-1) injection 100 mg (has no administration in time range)  folic acid (FOLVITE) tablet 1 mg (has no administration in time range)  multivitamin with minerals tablet 1 tablet (has no administration in time range)  sodium chloride 0.9 % bolus 1,000 mL (1,000 mLs Intravenous New Bag/Given 12/19/19 0750)  LORazepam (ATIVAN) injection 1 mg (1 mg Intravenous Given 12/19/19 0741)  dextrose 50 % solution 50 mL (50 mLs Intravenous Given 12/19/19 0848)    ED Course  I have reviewed the triage vital signs and the nursing notes.  Pertinent labs & imaging results that were available during my care of the patient were reviewed by me and considered in my medical decision making (see chart for details).  Vitals:   12/19/19 0845 12/19/19 0900  BP: 127/86 131/75  Pulse: (!) 117 (!) 114  Resp: 20 (!) 21  Temp:    SpO2: 93% 95%  ,   MDM  Rules/Calculators/A&P                          History provided by patient with additional history obtained from chart review.    59 year old female presenting with alcohol withdrawal.  Has been drinking daily for the last 2 weeks, last drink was yesterday.  On ED arrival she was noted to be afebrile, tachycardic, normotensive.  On my exam she is very anxious appearing.  She is tearful.  She has bilateral tremor noted to upper extremities without asterixis.  No abdominal tenderness.  She has dry mucous membranes. Labs were collected in triage.  CBC without leukocytosis, does appear to be hemoconcentrated suggesting dehydration.  BMP without significant electrolyte derangement, no renal insufficiency, normal anion gap.  UA without signs of infection.  Her chief complaint of dizziness however presentation is more consistent with an alcohol withdrawal.  Will add on additional labs, give IV fluids Ativan. Thiamine and folic acid ordered as well. EKG without ischemic changes. CIWA protocol initiated after initial assessment. On reassessment she continues to be tremulous and tachycardic.  LFTs are elevated with AST of 104 and ALT of 99.  Nurse documented a CIWA score of 5 however I feel it is higher as patient has visual hallucinations, persistent nausea and tremor. She is still tachycardic to 120. POC glucose checked and is low at 79.  Amp of D50 given as well as orange juice and glucose rechecked and is 190. Findings and plan of care discussed with supervising physician Dr. Jeanell Sparrow who agrees with plan to admit for alcohol withdrawal. Spoke with Dr. Lorin Mercy with hospitalist service who agrees to assume care of patient and bring into the hospital for further evaluation and management.     Portions of this note were generated with Lobbyist. Dictation errors may occur despite best attempts at proofreading.    Final Clinical Impression(s) / ED Diagnoses Final diagnoses:  Alcohol withdrawal  syndrome with perceptual disturbance [facility])    Rx / DC Orders ED Discharge Orders    None       Barrie Folk, PA-C 12/19/19 9924    Barrie Folk, PA-C 12/19/19 1100    Pattricia Boss, MD 12/21/19 [phone]

## 2019-12-20 DIAGNOSIS — F10232 Alcohol dependence with withdrawal with perceptual disturbance: Principal | ICD-10-CM

## 2019-12-20 DIAGNOSIS — F10239 Alcohol dependence with withdrawal, unspecified: Secondary | ICD-10-CM | POA: Diagnosis present

## 2019-12-20 DIAGNOSIS — R7302 Impaired glucose tolerance (oral): Secondary | ICD-10-CM | POA: Diagnosis present

## 2019-12-20 DIAGNOSIS — Z6841 Body Mass Index (BMI) 40.0 and over, adult: Secondary | ICD-10-CM | POA: Diagnosis not present

## 2019-12-20 DIAGNOSIS — J45909 Unspecified asthma, uncomplicated: Secondary | ICD-10-CM | POA: Diagnosis present

## 2019-12-20 DIAGNOSIS — Z888 Allergy status to other drugs, medicaments and biological substances status: Secondary | ICD-10-CM | POA: Diagnosis not present

## 2019-12-20 DIAGNOSIS — Z96653 Presence of artificial knee joint, bilateral: Secondary | ICD-10-CM | POA: Diagnosis present

## 2019-12-20 DIAGNOSIS — Z8719 Personal history of other diseases of the digestive system: Secondary | ICD-10-CM | POA: Diagnosis not present

## 2019-12-20 DIAGNOSIS — J4530 Mild persistent asthma, uncomplicated: Secondary | ICD-10-CM | POA: Diagnosis not present

## 2019-12-20 DIAGNOSIS — I10 Essential (primary) hypertension: Secondary | ICD-10-CM | POA: Diagnosis present

## 2019-12-20 DIAGNOSIS — Z825 Family history of asthma and other chronic lower respiratory diseases: Secondary | ICD-10-CM | POA: Diagnosis not present

## 2019-12-20 DIAGNOSIS — R441 Visual hallucinations: Secondary | ICD-10-CM | POA: Diagnosis present

## 2019-12-20 DIAGNOSIS — Z91013 Allergy to seafood: Secondary | ICD-10-CM | POA: Diagnosis not present

## 2019-12-20 DIAGNOSIS — Z91040 Latex allergy status: Secondary | ICD-10-CM | POA: Diagnosis not present

## 2019-12-20 DIAGNOSIS — F10231 Alcohol dependence with withdrawal delirium: Secondary | ICD-10-CM | POA: Diagnosis not present

## 2019-12-20 DIAGNOSIS — Z79899 Other long term (current) drug therapy: Secondary | ICD-10-CM | POA: Diagnosis not present

## 2019-12-20 DIAGNOSIS — Z20822 Contact with and (suspected) exposure to covid-19: Secondary | ICD-10-CM | POA: Diagnosis present

## 2019-12-20 DIAGNOSIS — K219 Gastro-esophageal reflux disease without esophagitis: Secondary | ICD-10-CM | POA: Diagnosis present

## 2019-12-20 DIAGNOSIS — Z823 Family history of stroke: Secondary | ICD-10-CM | POA: Diagnosis not present

## 2019-12-20 DIAGNOSIS — E785 Hyperlipidemia, unspecified: Secondary | ICD-10-CM | POA: Diagnosis present

## 2019-12-20 DIAGNOSIS — Z885 Allergy status to narcotic agent status: Secondary | ICD-10-CM | POA: Diagnosis not present

## 2019-12-20 DIAGNOSIS — Z86711 Personal history of pulmonary embolism: Secondary | ICD-10-CM | POA: Diagnosis not present

## 2019-12-20 DIAGNOSIS — Z7901 Long term (current) use of anticoagulants: Secondary | ICD-10-CM | POA: Diagnosis not present

## 2019-12-20 DIAGNOSIS — Z87891 Personal history of nicotine dependence: Secondary | ICD-10-CM | POA: Diagnosis not present

## 2019-12-20 LAB — GLUCOSE, CAPILLARY
Glucose-Capillary: 119 mg/dL — ABNORMAL HIGH (ref 70–99)
Glucose-Capillary: 118 mg/dL — ABNORMAL HIGH (ref 70–99)
Glucose-Capillary: 121 mg/dL — ABNORMAL HIGH (ref 70–99)
Glucose-Capillary: 185 mg/dL — ABNORMAL HIGH (ref 70–99)

## 2019-12-20 MED ORDER — CHLORDIAZEPOXIDE HCL 25 MG PO CAPS
25.0000 mg | ORAL_CAPSULE | Freq: Three times a day (TID) | ORAL | Status: AC
  Administered 2019-12-21 (×3): 25 mg via ORAL
  Filled 2019-12-20 (×3): qty 1

## 2019-12-20 MED ORDER — ALUM & MAG HYDROXIDE-SIMETH 200-200-20 MG/5ML PO SUSP
30.0000 mL | ORAL | Status: DC | PRN
  Administered 2019-12-20 – 2019-12-21 (×4): 30 mL via ORAL
  Filled 2019-12-20 (×4): qty 30

## 2019-12-20 MED ORDER — CHLORDIAZEPOXIDE HCL 25 MG PO CAPS
25.0000 mg | ORAL_CAPSULE | ORAL | Status: AC
  Administered 2019-12-22 (×2): 25 mg via ORAL
  Filled 2019-12-20 (×2): qty 1

## 2019-12-20 MED ORDER — CHLORDIAZEPOXIDE HCL 25 MG PO CAPS: 25.0000 mg | ORAL_CAPSULE | Freq: Four times a day (QID) | ORAL | Status: AC | PRN

## 2019-12-20 MED ORDER — CHLORDIAZEPOXIDE HCL 25 MG PO CAPS
25.0000 mg | ORAL_CAPSULE | Freq: Four times a day (QID) | ORAL | Status: AC
  Administered 2019-12-20 (×3): 25 mg via ORAL
  Filled 2019-12-20 (×3): qty 1

## 2019-12-20 MED ORDER — LOPERAMIDE HCL 2 MG PO CAPS: 2.0000 mg | ORAL_CAPSULE | ORAL | Status: AC | PRN

## 2019-12-20 MED ORDER — CHLORDIAZEPOXIDE HCL 25 MG PO CAPS
25.0000 mg | ORAL_CAPSULE | Freq: Every day | ORAL | Status: AC
  Administered 2019-12-23: 25 mg via ORAL
  Filled 2019-12-20: qty 1

## 2019-12-20 MED ORDER — ALBUTEROL SULFATE HFA 108 (90 BASE) MCG/ACT IN AERS
2.0000 | INHALATION_SPRAY | Freq: Four times a day (QID) | RESPIRATORY_TRACT | Status: DC | PRN
  Filled 2019-12-20 (×2): qty 6.7

## 2019-12-20 MED ORDER — HYDROXYZINE HCL 25 MG PO TABS: 25.0000 mg | ORAL_TABLET | Freq: Four times a day (QID) | ORAL | Status: AC | PRN

## 2019-12-20 MED ORDER — LACTULOSE 10 GM/15ML PO SOLN
20.0000 g | Freq: Once | ORAL | Status: AC
  Administered 2019-12-20: 20 g via ORAL
  Filled 2019-12-20: qty 30

## 2019-12-20 NOTE — Discharge Instructions (Signed)
[place], [place] Meetings [phone] Find [facility] meetings in [place], [place] to help you on your road to recovery. Our comprehensive directory of [place] meetings in [place] includes open, closed, speaker, and other specialized meetings, all of which are designed to help you get sober and remain sober.  [place], [place], [place]  VIEW DETAILS [facility] [place], [place], [place] [address], [place], [place]at[place] [address], [place] to [place], [place]  VIEW DETAILS [facility] [place], [place], [place]  VIEW DETAILS [id number].Marland KitchenMarland KitchenLast  Please note that AA meeting locations and times tend to change often and quickly, so always check before assuming these times are accurate. Do you have new information about an AA meeting? Please let us know! Find AA meetings in [place], [place] A city located in [place], [place], [place] is the third populous city in [place]. It is also the seat of [facility]. It is one the best places to live in [place] and offers its residents a dense [place] feel with plenty of activities to do. While that's the case for the town, residents can still be affected by pattern of drinking habits that can cause significant distress and functioning in their daily life. That's why AA meetings [place] have been operating for decades to help in the path of recovery  for those willing to live a better life. The meetings are in different formats which include discussion meetings, speaker meetings, big book studies and step/traditions meetings. Like that of AA [employer] are non-discriminatory and are free to join. Meetings such as discussions and speaker are in most cases open to the public, and anybody interested in [place] meeting activities and that of family members of those in recovery can attend. Big book studies and step/tradition meetings are based international guidelines that are used every city there is an alcoholic Publishing rights manager. Meetings can also be set up with gender specific or on sexual orientation, to help create a much safer space for those that require them. To best understand and know what is better suited to your recovery journey contact and check a directory.

## 2019-12-20 NOTE — Progress Notes (Addendum)
Initial Nutrition Assessment  DOCUMENTATION CODES:   Morbid obesity  INTERVENTION:  - will continue to monitor for nutrition-related needs while hospitalized.    NUTRITION DIAGNOSIS:   Inadequate oral intake related to social / environmental circumstances as evidenced by per patient/family report.  GOAL:   Patient will meet greater than or equal to 90% of their needs  MONITOR:   PO intake, Labs, Weight trends  REASON FOR ASSESSMENT:   Malnutrition Screening Tool, Consult Assessment of nutrition requirement/status  ASSESSMENT:   59 y.o. female with medical history of morbid obesity, HTN, HLD, and alcohol dependence/abuse. She presented to the ED with alcohol withdrawal. She was hospitalized for the same issue 8/13-8/19. She stopped drinking for 2 months and then re-started 2 weeks PTA. In the ED, she was very tremulous, worse than previous episodes. She was also having visual hallucinations in the ED.  No intakes documented since admission. Patient sitting in the chair and reports her husband is coming to visit this evening. She is feeling much better since admission, tremors are nearly gone.   Patient reports that she had stopped drinking for 2 months but re-started when she was at a friend's house for [employer] homecoming 2 weeks ago and her friend had made some drinks that patient was interested in trying. After that, she was drinking 1/5 of liquor/day.   Patient reports that during 2 week time frame she would go days without eating or have days where she would take a few small bites of an item throughout the day. She would sometimes feel hungry but then eat little to nothing once she got food.   She denies any nausea or discomfort today and reports eating 100% of breakfast and lunch. K is WDL and Mg and Phos have not been checked this admission; some degree about re-feeding given 2 weeks without much nutrition and now consuming 100% of meals.   Weight today is 299 lb, weight  yesterday documented as 250 lb (appears to be a stated weight), and PTA the most recently documented weight was on 09/29/19 when she weighed 312 lb.   Labs reviewed; CBGs: 119 and 118 mg/dl, LFTs elevated.  Medications reviewed; 100 mg colace BID, 20 mg oral pepcid/day, 1 mg folvite/day, sliding scale novolog, 20 g lactulose x1 dose 11/9, 25 mg hydrodiuril/day, 1 tablet multivitamin with minerals/day, 100 mg thiamine/day.  IVF; LR @ 75 ml/hr.     NUTRITION - FOCUSED PHYSICAL EXAM:  completed; no fat depletions, no muscle depletions, mild edema to BLE.   Diet Order:   Diet Order            Diet Carb Modified Fluid consistency: Thin; Room service appropriate? Yes  Diet effective now                 EDUCATION NEEDS:   No education needs have been identified at this time  Skin:  Skin Assessment: Reviewed RN Assessment  Last BM:  PTA/unknown  Height:   Ht Readings from Last 1 Encounters:  12/20/19 5\' 1"  (1.549 m)    Weight:   Wt Readings from Last 1 Encounters:  12/20/19 135.8 kg    Estimated Nutritional Needs:  Kcal:  2000-2200 kcal Protein:  100-115 grams Fluid:  >/= 2.2 L/day      Jarome Matin, MS, RD, LDN, CNSC Inpatient Clinical Dietitian RD pager # available in [facility]  After hours/weekend pager # available in [facility]

## 2019-12-20 NOTE — Progress Notes (Signed)
PROGRESS NOTE    [name]  UMP:[id number] DOB: 08/31/1960 DOA: 12/19/2019 PCP: Biagio Borg, MD    Chief Complaint  Patient presents with  . Dizziness    Brief Narrative:  HPI per Dr. Loleta Rose is a 59 y.o. female with medical history significant of morbid obesity (BMI 47.24); HTN; HLD; alcohol dependence; and h/o PE on Eliquis presenting with alcohol withdrawal.  She was previously hospitalized for this issue from 8/13-19 with DTs.  She stopped drinking for about 2 months about that admission and then restarted.  She reports that she stopped drinking for 2 months ago and fell off the wagon 2 weeks ago and drinking ever since.  She is drinking about a fifth a day.  She was off alcohol for about 6 months after having knee replacement.  She drank excessively during COVID.  She is feeling very tremulous, "they thought I was having a seizure", it's never been this bad."  She is having hallucinations - seeing bugs on the wall, seeing something in the light.  No known h/o seizures.  She wants to stop drinking again.  She has never been involved with AA.    ED Course:  Relapsed 1-2 weeks ago, last drink yesterday (1/5 liquor).  +nausea, dizziness, elevated LFTs.  Giving Ativan.  Persistent tachycardia.  Assessment & Plan:   Principal Problem:   Alcohol dependence with withdrawal ([place]) Active Problems:   Essential hypertension   Extrinsic asthma   Impaired glucose tolerance   Obesity, Class III, BMI 40-49.9 (morbid obesity) (HCC)   Hyperlipidemia   History of pulmonary embolism  1 alcohol dependence with withdrawal Patient with history of chronic EtOH dependence.  Patient states drinking fifth of liquor daily.  Patient noted to have had 1 admission in the past for alcohol withdrawal.  Patient noted to have had a history of alcohol withdrawal seizures with ICU admissions, DTs.  Patient on presentation had signs and symptoms of withdrawal with tremulousness and anxiety  as well as visual hallucinations.  Patient currently with tremulousness.  Patient on the Ativan withdrawal protocol.  Discontinue Valium and placed on the Librium detox protocol.  Continue thiamine, folic acid, multivitamin.  Supportive care.  Alcohol cessation stressed to patient.  Social work consulted.  2.  History of pulmonary embolism Patient noted to have had a first lifetime VTE event May 2020.  Currently on Eliquis.  Outpatient follow-up with PCP.  3.  Hypertension Stable.  Continue clonidine and Hyzaar.  4.  Impaired glucose tolerance Hemoglobin A1c 6.4 on 07/21/2019.  Sliding scale insulin.  5.  Asthma Stable.  Continue Breo Ellipta, albuterol MDI as needed.  6.  Morbid obesity  7.  Hyperlipidemia Continue statin.  DVT prophylaxis: eliquis Code Status: Full Family Communication: Updated patient.  No family at bedside. Disposition:   Status is: Inpatient    Dispo: The patient is from: Home              Anticipated d/c is to: Home              Anticipated d/c date is: 12/24/2019              Patient currently on IV Ativan, Librium detox protocol, ongoing tremors.  Not stable for discharge.       Consultants:   None  Procedures:   None  Antimicrobials:   None   Subjective: Patient laying in bed.  Still with tremors.  Denies any chest pain or shortness of  breath.  No abdominal pain.  States no significant improvement since admission.  Objective: Vitals:   12/20/19 0145 12/20/19 0152 12/20/19 0605 12/20/19 0856  BP:  (!) 163/105 (!) 145/99 137/82  Pulse:  95 (!) 106 (!) 106  Resp:  (!) 22 18 20   Temp:  98.1 F (36.7 C) 98.2 F (36.8 C) 97.8 F (36.6 C)  TempSrc:  Oral Oral Oral  SpO2:  98% 96% 96%  Weight: 135.8 kg     Height: 5\' 1"  (1.549 m)       Intake/Output Summary (Last 24 hours) at 12/20/2019 1133 Last data filed at 12/20/2019 0600 Gross per 24 hour  Intake 306.25 ml  Output 1 ml  Net 305.25 ml   Filed Weights   12/19/19 0858  12/20/19 0145  Weight: 113.4 kg 135.8 kg    Examination:  General exam: Tremors. Respiratory system: Clear to auscultation. Respiratory effort normal. Cardiovascular system: S1 & S2 heard, RRR. No JVD, murmurs, rubs, gallops or clicks. No pedal edema. Gastrointestinal system: Abdomen is nondistended, soft and nontender. No organomegaly or masses felt. Normal bowel sounds heard. Central nervous system: Alert and oriented. No focal neurological deficits. Extremities: Symmetric 5 x 5 power. Skin: No rashes, lesions or ulcers Psychiatry: Judgement and insight appear normal. Mood & affect appropriate.     Data Reviewed: I have personally reviewed following labs and imaging studies  CBC: Recent Labs  Lab 12/19/19 0439  WBC 9.5  HGB 15.3*  HCT 47.8*  MCV 91.0  PLT 353    Basic Metabolic Panel: Recent Labs  Lab 12/19/19 0439 12/19/19 0706  NA 138  --   K 3.9  --   CL 99  --   CO2 26  --   GLUCOSE 107*  --   BUN 13  --   CREATININE 0.90  --   CALCIUM 9.1  --   MG  --  2.0  PHOS  --  3.4    GFR: Estimated Creatinine Clearance: 89.3 mL/min (by C-G formula based on SCr of 0.9 mg/dL).  Liver Function Tests: Recent Labs  Lab 12/19/19 0706  AST 104*  ALT 99*  ALKPHOS 99  BILITOT 0.9  PROT 7.9  ALBUMIN 3.5    CBG: Recent Labs  Lab 12/19/19 0902 12/19/19 1220 12/19/19 1644 12/19/19 2228 12/20/19 0808  GLUCAP 190* 94 96 123* 119*     Recent Results (from the past 240 hour(s))  Resp Panel by RT PCR (RSV, Flu A&B, Covid) - Nasopharyngeal Swab     Status: None   Collection Time: 12/19/19  8:59 AM   Specimen: Nasopharyngeal Swab  Result Value Ref Range Status   SARS Coronavirus 2 by RT PCR NEGATIVE NEGATIVE Final    Comment: (NOTE) SARS-CoV-2 target nucleic acids are NOT DETECTED.  The SARS-CoV-2 RNA is generally detectable in upper respiratoy specimens during the acute phase of infection. The lowest concentration of SARS-CoV-2 viral copies this assay  can detect is 131 copies/mL. A negative result does not preclude SARS-Cov-2 infection and should not be used as the sole basis for treatment or other patient management decisions. A negative result may occur with  improper specimen collection/handling, submission of specimen other than nasopharyngeal swab, presence of viral mutation(s) within the areas targeted by this assay, and inadequate number of viral copies (<131 copies/mL). A negative result must be combined with clinical observations, patient history, and epidemiological information. The expected result is Negative.  Fact Sheet for Patients:  PinkCheek.be  Fact Sheet for  Healthcare Providers:  GravelBags.it  This test is no t yet approved or cleared by the [place] and  has been authorized for detection and/or diagnosis of SARS-CoV-2 by FDA under an Emergency Use Authorization (EUA). This EUA will remain  in effect (meaning this test can be used) for the duration of the COVID-19 declaration under Section 564(b)(1) of the Act, 21 U.S.C. section 360bbb-3(b)(1), unless the authorization is terminated or revoked sooner.     Influenza A by PCR NEGATIVE NEGATIVE Final   Influenza B by PCR NEGATIVE NEGATIVE Final    Comment: (NOTE) The Xpert Xpress SARS-CoV-2/FLU/RSV assay is intended as an aid in  the diagnosis of influenza from Nasopharyngeal swab specimens and  should not be used as a sole basis for treatment. Nasal washings and  aspirates are unacceptable for Xpert Xpress SARS-CoV-2/FLU/RSV  testing.  Fact Sheet for Patients: PinkCheek.be  Fact Sheet for Healthcare Providers: GravelBags.it  This test is not yet approved or cleared by the [place] FDA and  has been authorized for detection and/or diagnosis of SARS-CoV-2 by  FDA under an Emergency Use Authorization (EUA). This EUA will remain    in effect (meaning this test can be used) for the duration of the  Covid-19 declaration under Section 564(b)(1) of the Act, 21  U.S.C. section 360bbb-3(b)(1), unless the authorization is  terminated or revoked.    Respiratory Syncytial Virus by PCR NEGATIVE NEGATIVE Final    Comment: (NOTE) Fact Sheet for Patients: PinkCheek.be  Fact Sheet for Healthcare Providers: GravelBags.it  This test is not yet approved or cleared by the [place] FDA and  has been authorized for detection and/or diagnosis of SARS-CoV-2 by  FDA under an Emergency Use Authorization (EUA). This EUA will remain  in effect (meaning this test can be used) for the duration of the  COVID-19 declaration under Section 564(b)(1) of the Act, 21 U.S.C.  section 360bbb-3(b)(1), unless the authorization is terminated or  revoked. Performed at [facility], [place] [address] [place],  [address]          Radiology Studies: No results found.      Scheduled Meds: . apixaban  5 mg Oral BID  . chlordiazePOXIDE  25 mg Oral QID   Followed by  . [START ON 12/21/2019] chlordiazePOXIDE  25 mg Oral TID   Followed by  . [START ON 12/22/2019] chlordiazePOXIDE  25 mg Oral BH-qamhs   Followed by  . [START ON 12/23/2019] chlordiazePOXIDE  25 mg Oral Daily  . cloNIDine  0.1 mg Oral BID  . docusate sodium  100 mg Oral BID  . famotidine  20 mg Oral Daily  . fluticasone furoate-vilanterol  1 puff Inhalation Daily  . folic acid  1 mg Oral Daily  . losartan  100 mg Oral Daily   And  . hydrochlorothiazide  25 mg Oral Daily  . insulin aspart  0-15 Units Subcutaneous TID WC  . lactulose  20 g Oral Once  . LORazepam  0-4 mg Intravenous Q6H   Followed by  . [START ON 12/21/2019] LORazepam  0-4 mg Intravenous Q12H  . multivitamin with minerals  1 tablet Oral Daily  . sodium chloride flush  3 mL Intravenous Q12H  . thiamine  100 mg Oral Daily   Or  .  thiamine  100 mg Intravenous Daily   Continuous Infusions: . lactated ringers 75 mL/hr at 12/20/19 0211     LOS: 0 days    Time spent: 40 mins  Irine Seal, MD [facility]   To contact the attending provider between 7A-7P or the covering provider during after hours 7P-7A, please log into the web site [email or web address] and access using universal [place] password for that web site. If you do not have the password, please call the hospital operator.  12/20/2019, 11:33 AM

## 2019-12-20 NOTE — ED Notes (Signed)
[facility] being called at this time.

## 2019-12-20 NOTE — TOC Transition Note (Signed)
Transition of Care [facility]) - CM/SW Discharge Note   Patient Details  Name: [name] MRN: [id number] Date of Birth: 02-14-1960  [facility] CM/SW Contact:  Dessa Phi, RN Phone Number: 12/20/2019, 10:52 AM   Clinical Narrative:Spoke to patient about etoh resources-patient agreed to resources-provided to patient w/resources.No further CM needs.       Final next level of care: Home/Self Care Barriers to Discharge: No Barriers Identified   Patient Goals and CMS Choice Patient states their goals for this hospitalization and ongoing recovery are:: go home CMS Medicare.gov Compare Post Acute Care list provided to:: Patient Choice offered to / list presented to : Patient (Etoh resources.)  Discharge Placement                       Discharge Plan and Services   Discharge Planning Services: CM Consult Post Acute Care Choice: NA                               Social Determinants of Health (SDOH) Interventions     Readmission Risk Interventions No flowsheet data found.

## 2019-12-21 LAB — COMPREHENSIVE METABOLIC PANEL
ALT: 108 U/L — ABNORMAL HIGH (ref 0–44)
AST: 121 U/L — ABNORMAL HIGH (ref 15–41)
Albumin: 3.5 g/dL (ref 3.5–5.0)
Alkaline Phosphatase: 82 U/L (ref 38–126)
Anion gap: 11 (ref 5–15)
BUN: 15 mg/dL (ref 6–20)
CO2: 28 mmol/L (ref 22–32)
Calcium: 9.2 mg/dL (ref 8.9–10.3)
Chloride: 99 mmol/L (ref 98–111)
Creatinine, Ser: 0.97 mg/dL (ref 0.44–1.00)
GFR, Estimated: >60 mL/min (ref >60)
Glucose, Bld: 147 mg/dL — ABNORMAL HIGH (ref 70–99)
Potassium: 3.3 mmol/L — ABNORMAL LOW (ref 3.5–5.1)
Sodium: 138 mmol/L (ref 135–145)
Total Bilirubin: 0.9 mg/dL (ref 0.3–1.2)
Total Protein: 7.5 g/dL (ref 6.5–8.1)

## 2019-12-21 LAB — CBC WITH DIFFERENTIAL/PLATELET
Abs Immature Granulocytes: 0.03 10*3/uL (ref 0.00–0.07)
Basophils Absolute: 0 10*3/uL (ref 0.0–0.1)
Basophils Relative: 0 %
Eosinophils Absolute: 0.3 10*3/uL (ref 0.0–0.5)
Eosinophils Relative: 3 %
HCT: 45 % (ref 36.0–46.0)
Hemoglobin: 14.5 g/dL (ref 12.0–15.0)
Immature Granulocytes: 0 %
Lymphocytes Relative: 14 %
Lymphs Abs: 1.4 10*3/uL (ref 0.7–4.0)
MCH: 30.2 pg (ref 26.0–34.0)
MCHC: 32.2 g/dL (ref 30.0–36.0)
MCV: 93.8 fL (ref 80.0–100.0)
Monocytes Absolute: 0.5 10*3/uL (ref 0.1–1.0)
Monocytes Relative: 5 %
Neutro Abs: 7.2 10*3/uL (ref 1.7–7.7)
Neutrophils Relative %: 78 %
Platelets: 193 10*3/uL (ref 150–400)
RBC: 4.8 MIL/uL (ref 3.87–5.11)
RDW: 13.9 % (ref 11.5–15.5)
WBC: 9.4 10*3/uL (ref 4.0–10.5)
nRBC: 0 % (ref 0.0–0.2)

## 2019-12-21 LAB — MAGNESIUM: Magnesium: 2.1 mg/dL (ref 1.7–2.4)

## 2019-12-21 LAB — HEMOGLOBIN A1C
Hgb A1c MFr Bld: 6.2 % — ABNORMAL HIGH (ref 4.8–5.6)
Mean Plasma Glucose: 131.24 mg/dL

## 2019-12-21 LAB — GLUCOSE, CAPILLARY
Glucose-Capillary: 145 mg/dL — ABNORMAL HIGH (ref 70–99)
Glucose-Capillary: 166 mg/dL — ABNORMAL HIGH (ref 70–99)
Glucose-Capillary: 99 mg/dL (ref 70–99)
Glucose-Capillary: 122 mg/dL — ABNORMAL HIGH (ref 70–99)
Glucose-Capillary: 123 mg/dL — ABNORMAL HIGH (ref 70–99)

## 2019-12-21 LAB — PHOSPHORUS: Phosphorus: 2.1 mg/dL — ABNORMAL LOW (ref 2.5–4.6)

## 2019-12-21 MED ORDER — SENNOSIDES-DOCUSATE SODIUM 8.6-50 MG PO TABS
1.0000 | ORAL_TABLET | Freq: Two times a day (BID) | ORAL | Status: DC
  Administered 2019-12-21 – 2019-12-23 (×4): 1 via ORAL
  Filled 2019-12-21 (×4): qty 1

## 2019-12-21 MED ORDER — POTASSIUM PHOSPHATES 15 MMOLE/5ML IV SOLN
30.0000 mmol | Freq: Once | INTRAVENOUS | Status: AC
  Administered 2019-12-21: 30 mmol via INTRAVENOUS
  Filled 2019-12-21: qty 10

## 2019-12-21 MED ORDER — POTASSIUM & SODIUM PHOSPHATES 280-160-250 MG PO PACK
2.0000 | PACK | Freq: Three times a day (TID) | ORAL | Status: AC
  Administered 2019-12-21 – 2019-12-22 (×3): 2 via ORAL
  Filled 2019-12-21 (×3): qty 2

## 2019-12-21 MED ORDER — LORATADINE 10 MG PO TABS
10.0000 mg | ORAL_TABLET | Freq: Every day | ORAL | Status: DC
  Administered 2019-12-21 – 2019-12-22 (×2): 10 mg via ORAL
  Filled 2019-12-21 (×3): qty 1

## 2019-12-21 MED ORDER — POTASSIUM CHLORIDE 20 MEQ PO PACK
40.0000 meq | PACK | Freq: Once | ORAL | Status: AC
  Administered 2019-12-21: 40 meq via ORAL
  Filled 2019-12-21: qty 2

## 2019-12-21 MED ORDER — LACTULOSE 10 GM/15ML PO SOLN: 20.0000 g | Freq: Two times a day (BID) | ORAL | Status: DC | PRN

## 2019-12-21 MED ORDER — POLYETHYLENE GLYCOL 3350 17 G PO PACK
17.0000 g | PACK | Freq: Two times a day (BID) | ORAL | Status: DC
  Administered 2019-12-21 – 2019-12-23 (×4): 17 g via ORAL
  Filled 2019-12-21 (×4): qty 1

## 2019-12-21 NOTE — Progress Notes (Signed)
Patient complained of irritation/burning at IV site after potassium phosphate administered. Attempted to decrease rate to assist with discomfort. Pt was able to tolerate. Provider updated, will discontinue infusion per M.D. order.

## 2019-12-21 NOTE — Progress Notes (Signed)
PROGRESS NOTE    [name]  UDJ:[id number] DOB: 08-02-1960 DOA: 12/19/2019 PCP: Biagio Borg, MD    Chief Complaint  Patient presents with  . Dizziness    Brief Narrative:  HPI per Dr. Loleta Rose is a 59 y.o. female with medical history significant of morbid obesity (BMI 47.24); HTN; HLD; alcohol dependence; and h/o PE on Eliquis presenting with alcohol withdrawal.  She was previously hospitalized for this issue from 8/13-19 with DTs.  She stopped drinking for about 2 months about that admission and then restarted.  She reports that she stopped drinking for 2 months ago and fell off the wagon 2 weeks ago and drinking ever since.  She is drinking about a fifth a day.  She was off alcohol for about 6 months after having knee replacement.  She drank excessively during COVID.  She is feeling very tremulous, "they thought I was having a seizure", it's never been this bad."  She is having hallucinations - seeing bugs on the wall, seeing something in the light.  No known h/o seizures.  She wants to stop drinking again.  She has never been involved with AA.    ED Course:  Relapsed 1-2 weeks ago, last drink yesterday (1/5 liquor).  +nausea, dizziness, elevated LFTs.  Giving Ativan.  Persistent tachycardia.  Assessment & Plan:   Principal Problem:   Alcohol dependence with withdrawal ([place]) Active Problems:   Essential hypertension   Extrinsic asthma   Impaired glucose tolerance   Obesity, Class III, BMI 40-49.9 (morbid obesity) (HCC)   Hyperlipidemia   History of pulmonary embolism  1 alcohol dependence with withdrawal Patient with history of chronic EtOH dependence.  Patient states drinking fifth of liquor daily.  Patient noted to have had 1 admission in the past for alcohol withdrawal.  Patient noted to have had a history of alcohol withdrawal seizures with ICU admissions, DTs.  Patient on presentation had signs and symptoms of withdrawal with tremulousness and anxiety  as well as visual hallucinations.  Patient currently with less tremors noted.  Clinical improvement.  Continue Ativan withdrawal protocol.  Continue Librium detox protocol.  Continue thiamine, folic acid, multivitamin.  Supportive care.  Alcohol cessation stressed to patient.  Social work consulted.  Tremulousness.  Patient on the Ativan withdrawal protocol.  Discontinue Valium and placed on the Librium detox protocol.  Continue thiamine,   2.  History of pulmonary embolism Patient noted to have had a first lifetime VTE event May 2020.  Currently on Eliquis.  Outpatient follow-up with PCP.  3.  Hypertension Continue Hyzaar and clonidine.   4.  Impaired glucose tolerance Hemoglobin A1c 6.4 on 07/21/2019.  Repeat hemoglobin A1c 6.2.  Sliding scale insulin.  5.  Asthma Stable.  Continue Breo Ellipta, albuterol MDI as needed.  6.  Morbid obesity  7.  Hyperlipidemia Statin.  DVT prophylaxis: eliquis Code Status: Full Family Communication: Updated patient.  Updated husband at bedside. Disposition:   Status is: Inpatient    Dispo: The patient is from: Home              Anticipated d/c is to: Home              Anticipated d/c date is: 12/24/2019              Patient currently on IV Ativan, Librium detox protocol, ongoing tremors.  Not stable for discharge.       Consultants:   None  Procedures:   None  Antimicrobials:   None   Subjective: Patient sitting up in chair.  Feeling better.  Feels tremors have improved.  No chest pain no shortness of breath.  No abdominal pain.  Patient states had a good bowel movement after lactulose was given yesterday.  Feeling better than she did on admission.  Ambulating.   Objective: Vitals:   12/21/19 0537 12/21/19 0858 12/21/19 0900 12/21/19 0916  BP: 104/78   137/80  Pulse: 83   (!) 109  Resp: 20   18  Temp: 97.7 F (36.5 C)     TempSrc:      SpO2: 97% 96% 96% 98%  Weight:      Height:        Intake/Output Summary (Last 24  hours) at 12/21/2019 1142 Last data filed at 12/20/2019 2146 Gross per 24 hour  Intake 480 ml  Output --  Net 480 ml   Filed Weights   12/19/19 0858 12/20/19 0145  Weight: 113.4 kg 135.8 kg    Examination:  General exam: Less tremors. Respiratory system: CTAB.  No wheezes, no crackles, no rhonchi.  Normal respiratory effort.   Cardiovascular system: Regular rate rhythm no murmurs rubs or gallops.  No JVD.  No lower extremity edema.   Gastrointestinal system: Abdomen is obese, soft, nontender, nondistended, positive bowel sounds.  No rebound.  No guarding. Central nervous system: Alert and oriented. No focal neurological deficits. Extremities: Symmetric 5 x 5 power. Skin: No rashes, lesions or ulcers Psychiatry: Judgement and insight appear normal. Mood & affect appropriate.     Data Reviewed: I have personally reviewed following labs and imaging studies  CBC: Recent Labs  Lab 12/19/19 0439 12/21/19 0857  WBC 9.5 9.4  NEUTROABS  --  7.2  HGB 15.3* 14.5  HCT 47.8* 45.0  MCV 91.0 93.8  PLT 279 767    Basic Metabolic Panel: Recent Labs  Lab 12/19/19 0439 12/19/19 0706 12/21/19 0857  NA 138  --  138  K 3.9  --  3.3*  CL 99  --  99  CO2 26  --  28  GLUCOSE 107*  --  147*  BUN 13  --  15  CREATININE 0.90  --  0.97  CALCIUM 9.1  --  9.2  MG  --  2.0 2.1  PHOS  --  3.4 2.1*    GFR: Estimated Creatinine Clearance: 82.8 mL/min (by C-G formula based on SCr of 0.97 mg/dL).  Liver Function Tests: Recent Labs  Lab 12/19/19 0706 12/21/19 0857  AST 104* 121*  ALT 99* 108*  ALKPHOS 99 82  BILITOT 0.9 0.9  PROT 7.9 7.5  ALBUMIN 3.5 3.5    CBG: Recent Labs  Lab 12/20/19 1153 12/20/19 1659 12/20/19 2056 12/21/19 0740 12/21/19 0905  GLUCAP 118* 121* 185* 145* 166*     Recent Results (from the past 240 hour(s))  Resp Panel by RT PCR (RSV, Flu A&B, Covid) - Nasopharyngeal Swab     Status: None   Collection Time: 12/19/19  8:59 AM   Specimen:  Nasopharyngeal Swab  Result Value Ref Range Status   SARS Coronavirus 2 by RT PCR NEGATIVE NEGATIVE Final    Comment: (NOTE) SARS-CoV-2 target nucleic acids are NOT DETECTED.  The SARS-CoV-2 RNA is generally detectable in upper respiratoy specimens during the acute phase of infection. The lowest concentration of SARS-CoV-2 viral copies this assay can detect is 131 copies/mL. A negative result does not preclude SARS-Cov-2 infection and should not be used as the sole  basis for treatment or other patient management decisions. A negative result may occur with  improper specimen collection/handling, submission of specimen other than nasopharyngeal swab, presence of viral mutation(s) within the areas targeted by this assay, and inadequate number of viral copies (<131 copies/mL). A negative result must be combined with clinical observations, patient history, and epidemiological information. The expected result is Negative.  Fact Sheet for Patients:  PinkCheek.be  Fact Sheet for Healthcare Providers:  GravelBags.it  This test is no t yet approved or cleared by the [place] FDA and  has been authorized for detection and/or diagnosis of SARS-CoV-2 by FDA under an Emergency Use Authorization (EUA). This EUA will remain  in effect (meaning this test can be used) for the duration of the COVID-19 declaration under Section 564(b)(1) of the Act, 21 U.S.C. section 360bbb-3(b)(1), unless the authorization is terminated or revoked sooner.     Influenza A by PCR NEGATIVE NEGATIVE Final   Influenza B by PCR NEGATIVE NEGATIVE Final    Comment: (NOTE) The Xpert Xpress SARS-CoV-2/FLU/RSV assay is intended as an aid in  the diagnosis of influenza from Nasopharyngeal swab specimens and  should not be used as a sole basis for treatment. Nasal washings and  aspirates are unacceptable for Xpert Xpress SARS-CoV-2/FLU/RSV  testing.  Fact Sheet  for Patients: PinkCheek.be  Fact Sheet for Healthcare Providers: GravelBags.it  This test is not yet approved or cleared by the [place] FDA and  has been authorized for detection and/or diagnosis of SARS-CoV-2 by  FDA under an Emergency Use Authorization (EUA). This EUA will remain  in effect (meaning this test can be used) for the duration of the  Covid-19 declaration under Section 564(b)(1) of the Act, 21  U.S.C. section 360bbb-3(b)(1), unless the authorization is  terminated or revoked.    Respiratory Syncytial Virus by PCR NEGATIVE NEGATIVE Final    Comment: (NOTE) Fact Sheet for Patients: PinkCheek.be  Fact Sheet for Healthcare Providers: GravelBags.it  This test is not yet approved or cleared by the [place] FDA and  has been authorized for detection and/or diagnosis of SARS-CoV-2 by  FDA under an Emergency Use Authorization (EUA). This EUA will remain  in effect (meaning this test can be used) for the duration of the  COVID-19 declaration under Section 564(b)(1) of the Act, 21 U.S.C.  section 360bbb-3(b)(1), unless the authorization is terminated or  revoked. Performed at [facility], [place] [address] [place], [place] [address]          Radiology Studies: No results found.      Scheduled Meds: . apixaban  5 mg Oral BID  . chlordiazePOXIDE  25 mg Oral TID   Followed by  . [START ON 12/22/2019] chlordiazePOXIDE  25 mg Oral BH-qamhs   Followed by  . [START ON 12/23/2019] chlordiazePOXIDE  25 mg Oral Daily  . cloNIDine  0.1 mg Oral BID  . famotidine  20 mg Oral Daily  . fluticasone furoate-vilanterol  1 puff Inhalation Daily  . folic acid  1 mg Oral Daily  . losartan  100 mg Oral Daily   And  . hydrochlorothiazide  25 mg Oral Daily  . insulin aspart  0-15 Units Subcutaneous TID WC  . LORazepam  0-4 mg Intravenous Q12H  .  multivitamin with minerals  1 tablet Oral Daily  . polyethylene glycol  17 g Oral BID  . potassium chloride  40 mEq Oral Once  . senna-docusate  1 tablet Oral BID  . sodium  chloride flush  3 mL Intravenous Q12H  . thiamine  100 mg Oral Daily   Or  . thiamine  100 mg Intravenous Daily   Continuous Infusions: . potassium PHOSPHATE IVPB (in mmol)       LOS: 1 day    Time spent: 40 mins    Irine Seal, MD [facility]   To contact the attending provider between 7A-7P or the covering provider during after hours 7P-7A, please log into the web site [email or web address] and access using universal [place] password for that web site. If you do not have the password, please call the hospital operator.  12/21/2019, 11:42 AM

## 2019-12-22 LAB — BASIC METABOLIC PANEL
Anion gap: 8 (ref 5–15)
BUN: 17 mg/dL (ref 6–20)
CO2: 27 mmol/L (ref 22–32)
Calcium: 9.1 mg/dL (ref 8.9–10.3)
Chloride: 102 mmol/L (ref 98–111)
Creatinine, Ser: 0.93 mg/dL (ref 0.44–1.00)
GFR, Estimated: >60 mL/min (ref >60)
Glucose, Bld: 142 mg/dL — ABNORMAL HIGH (ref 70–99)
Potassium: 3.8 mmol/L (ref 3.5–5.1)
Sodium: 137 mmol/L (ref 135–145)

## 2019-12-22 LAB — PHOSPHORUS: Phosphorus: 4.3 mg/dL (ref 2.5–4.6)

## 2019-12-22 LAB — GLUCOSE, CAPILLARY
Glucose-Capillary: 123 mg/dL — ABNORMAL HIGH (ref 70–99)
Glucose-Capillary: 90 mg/dL (ref 70–99)
Glucose-Capillary: 146 mg/dL — ABNORMAL HIGH (ref 70–99)
Glucose-Capillary: 108 mg/dL — ABNORMAL HIGH (ref 70–99)

## 2019-12-22 LAB — MAGNESIUM: Magnesium: 2.1 mg/dL (ref 1.7–2.4)

## 2019-12-22 NOTE — Progress Notes (Signed)
PROGRESS NOTE    [name]  YOV:[id number] DOB: [birth year] DOA: 12/19/2019 PCP: Biagio Borg, MD    Chief Complaint  Patient presents with  . Dizziness    Brief Narrative:  HPI per Dr. Loleta Rose is a 59 y.o. female with medical history significant of morbid obesity (BMI 47.24); HTN; HLD; alcohol dependence; and h/o PE on Eliquis presenting with alcohol withdrawal.  She was previously hospitalized for this issue from 8/13-19 with DTs.  She stopped drinking for about 2 months about that admission and then restarted.  She reports that she stopped drinking for 2 months ago and fell off the wagon 2 weeks ago and drinking ever since.  She is drinking about a fifth a day.  She was off alcohol for about 6 months after having knee replacement.  She drank excessively during COVID.  She is feeling very tremulous, "they thought I was having a seizure", it's never been this bad."  She is having hallucinations - seeing bugs on the wall, seeing something in the light.  No known h/o seizures.  She wants to stop drinking again.  She has never been involved with AA.    ED Course:  Relapsed 1-2 weeks ago, last drink yesterday (1/5 liquor).  +nausea, dizziness, elevated LFTs.  Giving Ativan.  Persistent tachycardia.  Assessment & Plan:   Principal Problem:   Alcohol dependence with withdrawal ([place]) Active Problems:   Essential hypertension   Extrinsic asthma   Impaired glucose tolerance   Obesity, Class III, BMI 40-49.9 (morbid obesity) (H[facility])   Hyperlipidemia   History of pulmonary embolism  1 alcohol dependence with withdrawal Patient with history of chronic EtOH dependence.  Patient states drinking fifth of liquor daily.  Patient noted to have had 1 admission in the past for alcohol withdrawal.  Patient noted to have had a history of alcohol withdrawal seizures with ICU admissions, DTs.  Patient on presentation had signs and symptoms of withdrawal with tremulousness and anxiety  as well as visual hallucinations.  Improving clinically.  Continue Ativan withdrawal protocol.  Continue Librium detox protocol, thiamine, folic acid, multivitamin.  Supportive care.  Alcohol cessation stressed to patient.  Social work consulted.   2.  History of pulmonary embolism Patient noted to have had a first lifetime VTE event May 2020.  Continue Eliquis.  Outpatient follow-up with PCP.   3.  Hypertension Continue Hyzaar and clonidine.   4.  Impaired glucose tolerance Hemoglobin A1c 6.4 on 07/21/2019.  Repeat hemoglobin A1c 6.2.  Continue sliding scale insulin.    5.  Asthma Stable.  Continue Breo Ellipta, albuterol MDI as needed.  6.  Morbid obesity  7.  Hyperlipidemia Continue statin.   DVT prophylaxis: eliquis Code Status: Full Family Communication: Updated patient.  No family at bedside.  Disposition:   Status is: Inpatient    Dispo: The patient is from: Home              Anticipated d/c is to: Home              Anticipated d/c date is: 12/23/2019              Patient currently on IV Ativan, Librium detox protocol, ongoing tremors.  Not stable for discharge.       Consultants:   None  Procedures:   None  Antimicrobials:   None   Subjective: Sitting up in chair.  Feeling better.  Tremors improved.  No nausea or vomiting.  No  abdominal pain.  Having bowel movements.   Objective: Vitals:   12/21/19 1223 12/21/19 1800 12/21/19 2045 12/22/19 0634  BP: 128/75 126/78 (!) 139/91 133/80  Pulse: 93 88 91 97  Resp: (!) 22  20 19   Temp: 98 F (36.7 C)  98 F (36.7 C) 97.8 F (36.6 C)  TempSrc: Oral  Oral Oral  SpO2: 97%  97% 97%  Weight:      Height:        Intake/Output Summary (Last 24 hours) at 12/22/2019 1213 Last data filed at 12/21/2019 2200 Gross per 24 hour  Intake 674.21 ml  Output --  Net 674.21 ml   Filed Weights   12/19/19 0858 12/20/19 0145  Weight: 113.4 kg 135.8 kg    Examination:  General exam: Large  tremors. Respiratory system: Lungs clear to auscultation bilaterally.  No wheezes, no crackles, no rhonchi.  Normal respiratory effort.  Cardiovascular system: RRR no murmurs rubs or gallops.  No JVD.  No lower extremity edema.  Gastrointestinal system: Abdomen is obese, soft, nontender, nondistended, positive bowel sounds.  No rebound.  No guarding.  Central nervous system: Alert and oriented. No focal neurological deficits. Extremities: Symmetric 5 x 5 power. Skin: No rashes, lesions or ulcers Psychiatry: Judgement and insight appear normal. Mood & affect appropriate.     Data Reviewed: I have personally reviewed following labs and imaging studies  CBC: Recent Labs  Lab 12/19/19 0439 12/21/19 0857  WBC 9.5 9.4  NEUTROABS  --  7.2  HGB 15.3* 14.5  HCT 47.8* 45.0  MCV 91.0 93.8  PLT 279 626    Basic Metabolic Panel: Recent Labs  Lab 12/19/19 0439 12/19/19 0706 12/21/19 0857 12/22/19 0509  NA 138  --  138 137  K 3.9  --  3.3* 3.8  CL 99  --  99 102  CO2 26  --  28 27  GLUCOSE 107*  --  147* 142*  BUN 13  --  15 17  CREATININE 0.90  --  0.97 0.93  CALCIUM 9.1  --  9.2 9.1  MG  --  2.0 2.1 2.1  PHOS  --  3.4 2.1* 4.3    GFR: Estimated Creatinine Clearance: 86.4 mL/min (by C-G formula based on SCr of 0.93 mg/dL).  Liver Function Tests: Recent Labs  Lab 12/19/19 0706 12/21/19 0857  AST 104* 121*  ALT 99* 108*  ALKPHOS 99 82  BILITOT 0.9 0.9  PROT 7.9 7.5  ALBUMIN 3.5 3.5    CBG: Recent Labs  Lab 12/21/19 1211 12/21/19 1705 12/21/19 2042 12/22/19 0759 12/22/19 1145  GLUCAP 99 122* 123* 123* 90     Recent Results (from the past 240 hour(s))  Resp Panel by RT PCR (RSV, Flu A&B, Covid) - Nasopharyngeal Swab     Status: None   Collection Time: 12/19/19  8:59 AM   Specimen: Nasopharyngeal Swab  Result Value Ref Range Status   SARS Coronavirus 2 by RT PCR NEGATIVE NEGATIVE Final    Comment: (NOTE) SARS-CoV-2 target nucleic acids are NOT  DETECTED.  The SARS-CoV-2 RNA is generally detectable in upper respiratoy specimens during the acute phase of infection. The lowest concentration of SARS-CoV-2 viral copies this assay can detect is 131 copies/mL. A negative result does not preclude SARS-Cov-2 infection and should not be used as the sole basis for treatment or other patient management decisions. A negative result may occur with  improper specimen collection/handling, submission of specimen other than nasopharyngeal swab, presence of viral mutation(s)  within the areas targeted by this assay, and inadequate number of viral copies (<131 copies/mL). A negative result must be combined with clinical observations, patient history, and epidemiological information. The expected result is Negative.  Fact Sheet for Patients:  PinkCheek.be  Fact Sheet for Healthcare Providers:  GravelBags.it  This test is no t yet approved or cleared by the [place] FDA and  has been authorized for detection and/or diagnosis of SARS-CoV-2 by FDA under an Emergency Use Authorization (EUA). This EUA will remain  in effect (meaning this test can be used) for the duration of the COVID-19 declaration under Section 564(b)(1) of the Act, 21 U.S.C. section 360bbb-3(b)(1), unless the authorization is terminated or revoked sooner.     Influenza A by PCR NEGATIVE NEGATIVE Final   Influenza B by PCR NEGATIVE NEGATIVE Final    Comment: (NOTE) The Xpert Xpress SARS-CoV-2/FLU/RSV assay is intended as an aid in  the diagnosis of influenza from Nasopharyngeal swab specimens and  should not be used as a sole basis for treatment. Nasal washings and  aspirates are unacceptable for Xpert Xpress SARS-CoV-2/FLU/RSV  testing.  Fact Sheet for Patients: PinkCheek.be  Fact Sheet for Healthcare Providers: GravelBags.it  This test is not yet  approved or cleared by the [place] FDA and  has been authorized for detection and/or diagnosis of SARS-CoV-2 by  FDA under an Emergency Use Authorization (EUA). This EUA will remain  in effect (meaning this test can be used) for the duration of the  Covid-19 declaration under Section 564(b)(1) of the Act, 21  U.S.C. section 360bbb-3(b)(1), unless the authorization is  terminated or revoked.    Respiratory Syncytial Virus by PCR NEGATIVE NEGATIVE Final    Comment: (NOTE) Fact Sheet for Patients: PinkCheek.be  Fact Sheet for Healthcare Providers: GravelBags.it  This test is not yet approved or cleared by the [place] FDA and  has been authorized for detection and/or diagnosis of SARS-CoV-2 by  FDA under an Emergency Use Authorization (EUA). This EUA will remain  in effect (meaning this test can be used) for the duration of the  COVID-19 declaration under Section 564(b)(1) of the Act, 21 U.S.C.  section 360bbb-3(b)(1), unless the authorization is terminated or  revoked. Performed at [facility], [place] [address], [place], [place] [address]          Radiology Studies: No results found.      Scheduled Meds: . apixaban  5 mg Oral BID  . chlordiazePOXIDE  25 mg Oral BH-qamhs   Followed by  . [START ON 12/23/2019] chlordiazePOXIDE  25 mg Oral Daily  . cloNIDine  0.1 mg Oral BID  . famotidine  20 mg Oral Daily  . fluticasone furoate-vilanterol  1 puff Inhalation Daily  . folic acid  1 mg Oral Daily  . losartan  100 mg Oral Daily   And  . hydrochlorothiazide  25 mg Oral Daily  . insulin aspart  0-15 Units Subcutaneous TID WC  . loratadine  10 mg Oral Daily  . LORazepam  0-4 mg Intravenous Q12H  . multivitamin with minerals  1 tablet Oral Daily  . polyethylene glycol  17 g Oral BID  . potassium & sodium phosphates  2 packet Oral TID AC & HS  . senna-docusate  1 tablet Oral BID  . sodium chloride  flush  3 mL Intravenous Q12H  . thiamine  100 mg Oral Daily   Or  . thiamine  100 mg Intravenous Daily   Continuous Infusions:  LOS: 2 days    Time spent: 35 mins    Irine Seal, MD [facility]   To contact the attending provider between 7A-7P or the covering provider during after hours 7P-7A, please log into the web site [email or web address] and access using universal [place] password for that web site. If you do not have the password, please call the hospital operator.  12/22/2019, 12:13 PM

## 2019-12-23 LAB — GLUCOSE, CAPILLARY
Glucose-Capillary: 113 mg/dL — ABNORMAL HIGH (ref 70–99)
Glucose-Capillary: 102 mg/dL — ABNORMAL HIGH (ref 70–99)

## 2019-12-23 MED ORDER — SENNOSIDES-DOCUSATE SODIUM 8.6-50 MG PO TABS
1.0000 | ORAL_TABLET | Freq: Two times a day (BID) | ORAL | Status: DC
Start: 2019-12-23 — End: 2020-03-02

## 2019-12-23 MED ORDER — LIVING WELL WITH DIABETES BOOK
Freq: Once | Status: AC
  Filled 2019-12-23: qty 1

## 2019-12-23 MED ORDER — POLYETHYLENE GLYCOL 3350 17 G PO PACK
17.0000 g | PACK | Freq: Every day | ORAL | 0 refills | Status: DC | PRN
Start: 2019-12-23 — End: 2020-03-02

## 2019-12-23 MED ORDER — FOLIC ACID 1 MG PO TABS
1.0000 mg | ORAL_TABLET | Freq: Every day | ORAL | Status: DC
Start: 2019-12-24 — End: 2020-03-02

## 2019-12-23 MED ORDER — ADULT MULTIVITAMIN W/MINERALS CH
1.0000 | ORAL_TABLET | Freq: Every day | ORAL | Status: DC
Start: 2019-12-24 — End: 2020-04-04

## 2019-12-23 MED ORDER — LORATADINE 10 MG PO TABS
10.0000 mg | ORAL_TABLET | Freq: Every day | ORAL | 0 refills | Status: DC
Start: 2019-12-24 — End: 2020-03-02

## 2019-12-23 MED ORDER — THIAMINE HCL 100 MG PO TABS
100.0000 mg | ORAL_TABLET | Freq: Every day | ORAL | Status: DC
Start: 2019-12-24 — End: 2020-03-02

## 2019-12-23 NOTE — Plan of Care (Signed)
Patient is ready for discharge. Transportation has been arranged and is outside the main hospital entrance. AVS has been discussed in great detail with the patient and all questions answered to include medication questions. Patient provided with education on new meds and advised on the importance of making and attending her follow-up appoint with her PCP. All IV's removed personal belongings returned and patient transported by Probation officer to her car where her significant others is waiting to carry her home. Telemetry Box removed, cleaned, and turned in. Patient Discharged.    Problem: Education: Goal: Knowledge of General Education information will improve Description: Including pain rating scale, medication(s)/side effects and non-pharmacologic comfort measures Outcome: Adequate for Discharge   Problem: Health Behavior/Discharge Planning: Goal: Ability to manage health-related needs will improve Outcome: Adequate for Discharge   Problem: Clinical Measurements: Goal: Ability to maintain clinical measurements within normal limits will improve Outcome: Adequate for Discharge Goal: Will remain free from infection Outcome: Adequate for Discharge Goal: Diagnostic test results will improve Outcome: Adequate for Discharge Goal: Cardiovascular complication will be avoided Outcome: Adequate for Discharge   Problem: Activity: Goal: Risk for activity intolerance will decrease Outcome: Adequate for Discharge   Problem: Nutrition: Goal: Adequate nutrition will be maintained Outcome: Adequate for Discharge   Problem: Coping: Goal: Level of anxiety will decrease Outcome: Adequate for Discharge   Problem: Elimination: Goal: Will not experience complications related to bowel motility Outcome: Adequate for Discharge Goal: Will not experience complications related to urinary retention Outcome: Adequate for Discharge   Problem: Pain Managment: Goal: General experience of comfort will improve Outcome:  Adequate for Discharge   Problem: Safety: Goal: Ability to remain free from injury will improve Outcome: Adequate for Discharge   Problem: Skin Integrity: Goal: Risk for impaired skin integrity will decrease Outcome: Adequate for Discharge   Problem: Education: Goal: Knowledge of disease or condition will improve Outcome: Adequate for Discharge Goal: Understanding of discharge needs will improve Outcome: Adequate for Discharge   Problem: Health Behavior/Discharge Planning: Goal: Ability to identify changes in lifestyle to reduce recurrence of condition will improve Outcome: Adequate for Discharge Goal: Identification of resources available to assist in meeting health care needs will improve Outcome: Adequate for Discharge   Problem: Physical Regulation: Goal: Complications related to the disease process, condition or treatment will be avoided or minimized Outcome: Adequate for Discharge   Problem: Safety: Goal: Ability to remain free from injury will improve Outcome: Adequate for Discharge

## 2019-12-23 NOTE — Discharge Summary (Signed)
Physician Discharge Summary  [name] UMP:[id number] DOB: Oct 16, 1960 DOA: 12/19/2019  PCP: Biagio Borg, MD  Admit date: 12/19/2019 Discharge date: 12/23/2019  Time spent: 50 minutes  Recommendations for Outpatient Follow-up:  1. Follow-up with Biagio Borg, MD in 2 weeks.  On follow-up patient will need a basic metabolic profile done to follow-up on electrolytes and renal function.  Patient also need follow-up on prediabetes.  Patient also need follow-up on alcohol dependence.   Discharge Diagnoses:  Principal Problem:   Alcohol dependence with withdrawal ([place]) Active Problems:   Essential hypertension   Extrinsic asthma   Impaired glucose tolerance   Obesity, Class III, BMI 40-49.9 (morbid obesity) ([place])   Hyperlipidemia   History of pulmonary embolism   Discharge Condition: Stable and improved  Diet recommendation: Carb modified diet  Filed Weights   12/19/19 0858 12/20/19 0145  Weight: 113.4 kg 135.8 kg    History of present illness:  HPI per Dr. Loleta Rose is a 59 y.o. female with medical history significant of morbid obesity (BMI 47.24); HTN; HLD; alcohol dependence; and h/o PE on Eliquis presenting with alcohol withdrawal.  She was previously hospitalized for this issue from 8/13-19 with DTs.  She stopped drinking for about 2 months about that admission and then restarted.  She reports that she stopped drinking for 2 months ago and fell off the wagon 2 weeks ago and drinking ever since.  She is drinking about a fifth a day.  She was off alcohol for about 6 months after having knee replacement.  She drank excessively during COVID.  She is feeling very tremulous, "they thought I was having a seizure", it's never been this bad."  She is having hallucinations - seeing bugs on the wall, seeing something in the light.  No known h/o seizures.  She wants to stop drinking again.  She has never been involved with AA.    ED Course:  Relapsed 1-2 weeks ago,  last drink yesterday (1/5 liquor).  +nausea, dizziness, elevated LFTs.  Giving Ativan.  Persistent tachycardia.  Hospital Course:  1 alcohol dependence with withdrawal Patient with history of chronic EtOH dependence.  Patient stated she usually drinks fifth of liquor daily.  Patient noted to have had 1 admission in the past for alcohol withdrawal.  Patient noted to have had a history of alcohol withdrawal seizures with ICU admissions, DTs.  Patient on presentation had signs and symptoms of withdrawal with tremulousness and anxiety as well as visual hallucinations.    Patient maintained on the Ativan withdrawal protocol as well as started on the Librium detox protocol.  Patient also placed on thiamine, folic acid, multivitamin.  Alcohol cessation stressed to patient.  Patient improved clinically during the hospitalization and withdrawal symptoms had resolved by day of discharge.  Patient was seen by social work and given information/resources for alcohol cessation.  Patient seemed motivated.  Patient be discharged home in stable and improved condition.  Outpatient follow-up with PCP.  2.  History of pulmonary embolism Patient noted to have had a first lifetime VTE event May 2020.    Patient maintained on home regimen Eliquis.  Outpatient follow-up with PCP.   3.  Hypertension Patient maintained on home regimen Hyzaar and clonidine.   4.  Impaired glucose tolerance Hemoglobin A1c 6.4 on 07/21/2019.  Repeat hemoglobin A1c 6.2.  Risk factor modification was stressed to patient.  Diabetic coordinator called and spoke with patient on the telephone prior to discharge.  During  the hospitalization patient was maintained on a sliding scale insulin.  Outpatient follow-up with PCP.    5.  Asthma Remained stable during the hospitalization.  Patient was maintained on Breo Ellipta, albuterol MDI as needed.  6.  Morbid obesity  7.  Hyperlipidemia Patient maintained on home regimen statin.  Outpatient  follow-up.   Procedures:  None  Consultations:  None  Discharge Exam: Vitals:   12/23/19 0529 12/23/19 1133  BP: 109/66 (!) 141/89  Pulse: 88 93  Resp: 18 18  Temp: 98.5 F (36.9 C) 97.7 F (36.5 C)  SpO2: 97% 95%    General: NAD Cardiovascular: RRR Respiratory: CTAB  Discharge Instructions   Discharge Instructions    Diet Carb Modified   Complete by: As directed    Increase activity slowly   Complete by: As directed      Allergies as of 12/23/2019      Reactions   Morphine Itching   Shrimp [shellfish Allergy] Itching, Other (See Comments)   Tongue burns also   Tramadol Other (See Comments)   Caused confusion   Latex Rash      Medication List    TAKE these medications   Advair HFA 115-21 MCG/ACT inhaler Generic drug: fluticasone-salmeterol Inhale 1 puff into the lungs 2 (two) times daily.   albuterol 108 (90 Base) MCG/ACT inhaler Commonly known as: ProAir HFA INHALE 1 TO 2 PUFFS BY MOUTH EVERY 6 HOURS AS NEEDED FOR WHEEZE What changed:   how much to take  how to take this  when to take this  reasons to take this  additional instructions   apixaban 5 MG Tabs tablet Commonly known as: Eliquis Take 1 tablet (5 mg total) by mouth 2 (two) times daily.   atorvastatin 20 MG tablet Commonly known as: Lipitor Take 1 tablet (20 mg total) by mouth daily. Hold until your liver enzymes have normalized.   cimetidine 200 MG tablet Commonly known as: TAGAMET Take 200 mg by mouth daily.   cloNIDine 0.1 MG tablet Commonly known as: CATAPRES Take 1 tablet (0.1 mg total) by mouth 2 (two) times daily.   folic acid 1 MG tablet Commonly known as: FOLVITE Take 1 tablet (1 mg total) by mouth daily. Start taking on: December 24, 2019   Klor-Con M20 20 MEQ tablet Generic drug: potassium chloride SA TAKE 1 TABLET BY MOUTH EVERY DAY What changed:   how much to take  when to take this   loratadine 10 MG tablet Commonly known as: CLARITIN Take 1  tablet (10 mg total) by mouth daily. Start taking on: December 24, 2019   losartan-hydrochlorothiazide 100-25 MG tablet Commonly known as: HYZAAR TAKE 1 TABLET BY MOUTH EVERY DAY   multivitamin with minerals Tabs tablet Take 1 tablet by mouth daily. Start taking on: December 24, 2019   ondansetron 8 MG disintegrating tablet Commonly known as: ZOFRAN-ODT Take 1 tablet (8 mg total) by mouth every 8 (eight) hours as needed for nausea or vomiting.   polyethylene glycol 17 g packet Commonly known as: MIRALAX / GLYCOLAX Take 17 g by mouth daily as needed.   senna-docusate 8.6-50 MG tablet Commonly known as: Senokot-S Take 1 tablet by mouth 2 (two) times daily.   thiamine 100 MG tablet Take 1 tablet (100 mg total) by mouth daily. Start taking on: December 24, 2019      Allergies  Allergen Reactions  . Morphine Itching  . Shrimp [Shellfish Allergy] Itching and Other (See Comments)    Tongue burns  also  . Tramadol Other (See Comments)    Caused confusion  . Latex Rash    Follow-up Information    Biagio Borg, MD. Schedule an appointment as soon as possible for a visit in 2 week(s).   Specialties: Internal Medicine, Radiology Contact information: [place] [address] [phone]                The results of significant diagnostics from this hospitalization (including imaging, microbiology, ancillary and laboratory) are listed below for reference.    Significant Diagnostic Studies: No results found.  Microbiology: Recent Results (from the past 240 hour(s))  Resp Panel by RT PCR (RSV, Flu A&B, Covid) - Nasopharyngeal Swab     Status: None   Collection Time: 12/19/19  8:59 AM   Specimen: Nasopharyngeal Swab  Result Value Ref Range Status   SARS Coronavirus 2 by RT PCR NEGATIVE NEGATIVE Final    Comment: (NOTE) SARS-CoV-2 target nucleic acids are NOT DETECTED.  The SARS-CoV-2 RNA is generally detectable in upper respiratoy specimens  during the acute phase of infection. The lowest concentration of SARS-CoV-2 viral copies this assay can detect is 131 copies/mL. A negative result does not preclude SARS-Cov-2 infection and should not be used as the sole basis for treatment or other patient management decisions. A negative result may occur with  improper specimen collection/handling, submission of specimen other than nasopharyngeal swab, presence of viral mutation(s) within the areas targeted by this assay, and inadequate number of viral copies (<131 copies/mL). A negative result must be combined with clinical observations, patient history, and epidemiological information. The expected result is Negative.  Fact Sheet for Patients:  PinkCheek.be  Fact Sheet for Healthcare Providers:  GravelBags.it  This test is no t yet approved or cleared by the [place] FDA and  has been authorized for detection and/or diagnosis of SARS-CoV-2 by FDA under an Emergency Use Authorization (EUA). This EUA will remain  in effect (meaning this test can be used) for the duration of the COVID-19 declaration under Section 564(b)(1) of the Act, 21 U.S.C. section 360bbb-3(b)(1), unless the authorization is terminated or revoked sooner.     Influenza A by PCR NEGATIVE NEGATIVE Final   Influenza B by PCR NEGATIVE NEGATIVE Final    Comment: (NOTE) The Xpert Xpress SARS-CoV-2/FLU/RSV assay is intended as an aid in  the diagnosis of influenza from Nasopharyngeal swab specimens and  should not be used as a sole basis for treatment. Nasal washings and  aspirates are unacceptable for Xpert Xpress SARS-CoV-2/FLU/RSV  testing.  Fact Sheet for Patients: PinkCheek.be  Fact Sheet for Healthcare Providers: GravelBags.it  This test is not yet approved or cleared by the [place] FDA and  has been authorized for detection and/or  diagnosis of SARS-CoV-2 by  FDA under an Emergency Use Authorization (EUA). This EUA will remain  in effect (meaning this test can be used) for the duration of the  Covid-19 declaration under Section 564(b)(1) of the Act, 21  U.S.C. section 360bbb-3(b)(1), unless the authorization is  terminated or revoked.    Respiratory Syncytial Virus by PCR NEGATIVE NEGATIVE Final    Comment: (NOTE) Fact Sheet for Patients: PinkCheek.be  Fact Sheet for Healthcare Providers: GravelBags.it  This test is not yet approved or cleared by the [place] FDA and  has been authorized for detection and/or diagnosis of SARS-CoV-2 by  FDA under an Emergency Use Authorization (EUA). This EUA will remain  in effect (meaning this test can be  used) for the duration of the  COVID-19 declaration under Section 564(b)(1) of the Act, 21 U.S.C.  section 360bbb-3(b)(1), unless the authorization is terminated or  revoked. Performed at [facility], [address] [place], [place] [address]      Labs: Basic Metabolic Panel: Recent Labs  Lab 12/19/19 0439 12/19/19 0706 12/21/19 0857 12/22/19 0509  NA 138  --  138 137  K 3.9  --  3.3* 3.8  CL 99  --  99 102  CO2 26  --  28 27  GLUCOSE 107*  --  147* 142*  BUN 13  --  15 17  CREATININE 0.90  --  0.97 0.93  CALCIUM 9.1  --  9.2 9.1  MG  --  2.0 2.1 2.1  PHOS  --  3.4 2.1* 4.3   Liver Function Tests: Recent Labs  Lab 12/19/19 0706 12/21/19 0857  AST 104* 121*  ALT 99* 108*  ALKPHOS 99 82  BILITOT 0.9 0.9  PROT 7.9 7.5  ALBUMIN 3.5 3.5   No results for input(s): LIPASE, AMYLASE in the last 168 hours. No results for input(s): AMMONIA in the last 168 hours. CBC: Recent Labs  Lab 12/19/19 0439 12/21/19 0857  WBC 9.5 9.4  NEUTROABS  --  7.2  HGB 15.3* 14.5  HCT 47.8* 45.0  MCV 91.0 93.8  PLT 279 193   Cardiac Enzymes: No results for input(s): CKTOTAL, CKMB, CKMBINDEX,  TROPONINI in the last 168 hours. BNP: BNP (last 3 results) Recent Labs    05/23/19 0600  BNP 57.6    ProBNP (last 3 results) No results for input(s): PROBNP in the last 8760 hours.  CBG: Recent Labs  Lab 12/22/19 1145 12/22/19 1624 12/22/19 2034 12/23/19 0738 12/23/19 1131  GLUCAP 90 146* 108* 113* 102*       Signed:  Irine Seal MD.  [facility] 12/23/2019, 12:18 PM

## 2019-12-23 NOTE — Progress Notes (Signed)
Inpatient Diabetes Program Recommendations  AACE/ADA: New Consensus Statement on Inpatient Glycemic Control (2015)  Target Ranges:  Prepandial:   less than 140 mg/dL      Peak postprandial:   less than 180 mg/dL (1-2 hours)      Critically ill patients:  140 - 180 mg/dL   Lab Results  Component Value Date   GLUCAP 102 (H) 12/23/2019   HGBA1C 6.2 (H) 12/21/2019    Review of Glycemic Control  Diabetes history: Pre-DM Outpatient Diabetes medications: None Current orders for Inpatient glycemic control: Novolog 0-15 units tidwc  HgbA1C - 6.2% - indicating pre-diabetes.  Inpatient Diabetes Program Recommendations:    Ordered Living Well with Diabetes book for reference and Exit Care notes regarding Pre-diabetes.  Spoke with pt about her HgbA1C of 6.2%, pre-diabetes, and importance of lifestyle modification with diet, exercise and weight-loss to prevent full blown diabetes. We discussed diet in detail, eating more fiber, limiting sweets and watch portion sizes. Pt states she hasn't been as active a she usually is, with being out of work. Discussed eliminating ETOH. Stressed importance of f/u with PCP and make sure to have HgbA1C updated at least every 6 months.  Pt very appreciative of phone call and states she will work on the lifestyle issues of weight-loss and exercise.   Thank you. Lorenda Peck, RD, LDN, CDE Inpatient Diabetes Coordinator [phone]

## 2019-12-26 ENCOUNTER — Other Ambulatory Visit: Payer: Self-pay | Admitting: Internal Medicine

## 2020-01-06 ENCOUNTER — Ambulatory Visit: Payer: BC Managed Care – PPO | Admitting: Internal Medicine

## 2020-01-06 NOTE — Progress Notes (Deleted)
[name], female    DOB: 1960/05/14      MRN: [id number]   Brief patient profile:  59 yobf quit smoking in 1990 / [place]  remotely seen by [name] on advair 250 one daily and freq albuterol and then R Plantar fascitis around march 2020 and then toe surgery both sides May 6th 2020 then more sob May 15th > admitted 07/05/2018   Note spirometry 11/2012 nl    Admit date: 07/05/2018 Discharge date: 07/07/2018   Discharge Diagnoses:      Pulmonary embolism ([place])    Extrinsic asthma   Hypertension   Elevated troponin  Brief/Interim Summary:  59 y.o.femalewithhistory of hypertension, hyperlipidemia, asthma previous history of alcohol abuse states she only drinks occasionally nowadays presented to the ER because of nausea vomiting with some epigastric discomfort and burning sensation in the chest over the last 3 days PTA.     just recently had both  great toe nails removed in her left great toe nailbed has become more discolored but no discharge or pain.  ED Course:  CT angiogram of the chest which shows bilateral subsegmental pulmonary embolism with no strain pattern. Patient was started on heparin and admitted for further work-up. Labs also showed elevated LFTs with AST of 70 ALT of 55 bilirubin was 1.7. CT angiogram of the chest done for PE also showed persistent lesion in the liver which as per the radiologist was possible hemangioma.  Hospital course: Pulmonary embolism with no strain pattern per CT chest.  Echocardiogram was done consistent with no severe strain noted, was started on a heparin drip and converted over to Eliquis on discharge.  Dopplers were checked which were negative but TDS.   Elevated troponin; these were marginally elevated at 0.04 and 0.03 the setting of PE.  They remained flat, no acute ischemic changes on EKG.  Patient will follow-up with her PCP for continued outpatient monitoring and further coronary stratification with referral to  cardiology.    History of Present Illness  07/28/2018  Pulmonary/ 1st office eval/[name] re-establish re chronic doe/new pe Chief Complaint  Patient presents with  . Consult    Pulmonary Embolism-H/O asthma has not needed rescue inhaler.  Dyspnea:  MMRC1 = can walk nl pace, flat grade, can't hurry or go uphills or steps s sob   Cough: none Sleep: on back / 2 pillows  SABA use: none on advair one daily Re Call me if trouble getting the eliquis - you need to be on it for 6 full months Weight control is simply a matter of calorie balance    01/14/2019  f/u ov/[name] re: PE  Dx 06/2018 on eliquis, asthma much better  Chief Complaint  Patient presents with  . Follow-up    Breathing is doing well today. She is using her proair inhaler 3 x per wk on average.   Dyspnea:  MMRC1 = can walk nl pace, flat grade, can't hurry or go uphills or steps s sob   Cough: none Sleeping: 2 pillows  SABA use: as above  rec Only use your albuterol as a rescue medication  GERD  Diet   01/06/2020  f/u ov/[name] re:  No chief complaint on file.    Dyspnea:  *** Cough: *** Sleeping: *** SABA use: *** 02: ***   No obvious day to day or daytime variability or assoc excess/ purulent sputum or mucus plugs or hemoptysis or cp or chest tightness, subjective wheeze or overt sinus or hb symptoms.   ***  without nocturnal  or early am exacerbation  of respiratory  c/o's or need for noct saba. Also denies any obvious fluctuation of symptoms with weather or environmental changes or other aggravating or alleviating factors except as outlined above   No unusual exposure hx or h/o childhood pna/ asthma or knowledge of premature birth.  Current Allergies, Complete Past Medical History, Past Surgical History, Family History, and Social History were reviewed in [employer] record.  ROS  The following are not active complaints unless bolded Hoarseness, sore throat, dysphagia, dental problems,  itching, sneezing,  nasal congestion or discharge of excess mucus or purulent secretions, ear ache,   fever, chills, sweats, unintended wt loss or wt gain, classically pleuritic or exertional cp,  orthopnea pnd or arm/hand swelling  or leg swelling, presyncope, palpitations, abdominal pain, anorexia, nausea, vomiting, diarrhea  or change in bowel habits or change in bladder habits, change in stools or change in urine, dysuria, hematuria,  rash, arthralgias, visual complaints, headache, numbness, weakness or ataxia or problems with walking or coordination,  change in mood or  memory.        No outpatient medications have been marked as taking for the 01/06/20 encounter (Appointment) with Tanda Rockers, MD.                        Past Medical History:  Diagnosis Date  . Alcohol abuse    abuse- moderate years ago - states only drinks now on weekends -2 to 8 driinks   . Asthma   . COLONIC POLYPS, HX OF 04/05/2010  . DIVERTICULITIS, HX OF 04/05/2010  . DJD (degenerative joint disease)    right knee, mot to severe  . GERD (gastroesophageal reflux disease)    no meds  . Heart murmur    hx of   . Hyperlipidemia   . Hypertension   . Impaired glucose tolerance 12/06/2013  . Obesity (BMI 30-39.9)   . PALPITATIONS, HX OF 09/14/2007       Objective:        01/06/2020    ***  01/14/19 290 lb (131.5 kg)  01/14/19 292 lb 9.6 oz (132.7 kg)  12/06/18 281 lb 9.6 oz (127.7 kg)     Vital signs reviewed  01/06/2020  - Note at rest 02 sats  ***% on ***           Assessment

## 2020-01-11 ENCOUNTER — Other Ambulatory Visit: Payer: Self-pay | Admitting: Internal Medicine

## 2020-01-14 ENCOUNTER — Other Ambulatory Visit: Payer: Self-pay | Admitting: Internal Medicine

## 2020-01-15 ENCOUNTER — Encounter: Payer: Self-pay | Admitting: Internal Medicine

## 2020-01-19 ENCOUNTER — Other Ambulatory Visit: Payer: Self-pay

## 2020-01-19 ENCOUNTER — Ambulatory Visit (INDEPENDENT_AMBULATORY_CARE_PROVIDER_SITE_OTHER): Payer: BC Managed Care – PPO | Admitting: Podiatry

## 2020-01-19 DIAGNOSIS — B351 Tinea unguium: Secondary | ICD-10-CM

## 2020-01-19 DIAGNOSIS — M779 Enthesopathy, unspecified: Secondary | ICD-10-CM

## 2020-01-19 DIAGNOSIS — M79609 Pain in unspecified limb: Secondary | ICD-10-CM

## 2020-01-20 ENCOUNTER — Telehealth: Payer: Self-pay

## 2020-01-20 ENCOUNTER — Telehealth: Payer: Self-pay | Admitting: Podiatry

## 2020-01-20 NOTE — Telephone Encounter (Signed)
Please advise 

## 2020-01-20 NOTE — Telephone Encounter (Signed)
Pt was recently in the office and was told that pt medication would be called in please advise.

## 2020-01-20 NOTE — Telephone Encounter (Signed)
Called and stated she was suppose to have pain meds sent to her pharmacy and she went to pick it up and they didn't have it. Please send

## 2020-01-21 NOTE — Progress Notes (Signed)
Subjective:   Patient ID: [name], female   DOB: 59 y.o.   MRN: [id number]   HPI Patient presents stating she has had trouble with her nailbeds being sore and she cannot cut them and also has lesions on both feet that bother her   ROS      Objective:  Physical Exam  Neurovascular status intact with patient having extreme obesity which is complicating factor cannot reach her nails which get irritated painful and she cannot cut with lesions bilateral     Assessment:  Mycotic nail infection bilateral along with lesions     Plan:  Debridement of nailbeds debridement of lesions no iatrogenic bleeding repeat for routine care and instructed on daily foot inspections

## 2020-01-23 ENCOUNTER — Telehealth: Payer: Self-pay | Admitting: Podiatry

## 2020-01-23 ENCOUNTER — Other Ambulatory Visit: Payer: Self-pay | Admitting: Podiatry

## 2020-01-23 MED ORDER — HYDROCODONE-ACETAMINOPHEN 10-325 MG PO TABS
1.0000 | ORAL_TABLET | Freq: Three times a day (TID) | ORAL | 0 refills | Status: AC | PRN
Start: 2020-01-23 — End: 2020-01-28

## 2020-01-23 NOTE — Telephone Encounter (Signed)
I sent in a few pain pills

## 2020-01-23 NOTE — Telephone Encounter (Signed)
[name] will update this pt. Thanks

## 2020-01-23 NOTE — Telephone Encounter (Signed)
We do not write for pain medication for toenail clipping

## 2020-01-23 NOTE — Telephone Encounter (Signed)
Patient called again about her pain medication.

## 2020-01-23 NOTE — Telephone Encounter (Signed)
I wrote for a few pain meds

## 2020-01-24 ENCOUNTER — Other Ambulatory Visit: Payer: Self-pay | Admitting: Internal Medicine

## 2020-02-02 ENCOUNTER — Other Ambulatory Visit: Payer: Self-pay | Admitting: Internal Medicine

## 2020-02-09 ENCOUNTER — Telehealth: Payer: Self-pay | Admitting: Internal Medicine

## 2020-02-09 NOTE — Telephone Encounter (Signed)
   Patient has no blood pressure medication remaining  Please refill  Losartan  Pharmacy [id number] - [place], [place] - [address] AT [address]

## 2020-02-13 ENCOUNTER — Other Ambulatory Visit: Payer: Self-pay

## 2020-02-13 MED ORDER — LOSARTAN POTASSIUM-HCTZ 100-25 MG PO TABS
1.0000 | ORAL_TABLET | Freq: Every day | ORAL | 2 refills | Status: DC
Start: 2020-02-13 — End: 2020-02-27

## 2020-02-14 ENCOUNTER — Telehealth: Payer: Self-pay | Admitting: Internal Medicine

## 2020-02-14 ENCOUNTER — Other Ambulatory Visit: Payer: Self-pay | Admitting: Internal Medicine

## 2020-02-14 MED ORDER — PREDNISONE 10 MG PO TABS: ORAL_TABLET | ORAL | 0 refills | Status: AC

## 2020-02-14 MED ORDER — ADVAIR HFA 115-21 MCG/ACT IN AERO
1.0000 | INHALATION_SPRAY | Freq: Two times a day (BID) | RESPIRATORY_TRACT | 3 refills | Status: DC
Start: 2020-02-14 — End: 2020-03-02

## 2020-02-14 NOTE — Telephone Encounter (Signed)
Primary Pulmonologist: Dr. Sherene Sires Last office visit and with whom: 04/26/19 with Buelah Manis  What do we see them for (pulmonary problems): Asthma Last OV assessment/plan:   Assessment and Plan:  Asthmatic bronchitis:  - Productive cough x 1 week with associated sob/wheezing - Continue Advair twice daily and prn albuterol q4-6 hours for breakthrough shortness of breath  - RX prednisone taper to start tomorrow (40mg  x 2 days; 30mg  x 2 days; 20mg  x 2 days; 10mg  x 2 days) - Recommend mucinex twice daily  - Hycodan cough syrup 4ml at bedtime as needed for cough suppression  - Holding off on abx, if patient develops fever or purulent mucus consider covid testing (possible variant) and/or CXR   Was appointment offered to patient (explain)?  Has one wants something to get her to her upcoming appointment to alleviate the cough   Reason for call: patient started coughing and wheezing about 2-3 days ago. Patient is taking albuterol 3x a day one puff each time. Patient has not taken her advair inhaler in the last 2 weeks as she can no longer afford it.  She is wheezing more and gets winded when walking. She has not checked her sats. She is wanting a cough syrup that she can take until she is able to see Dr. on Jan 25.   Dr. please advise  Upon calling back patient states please leave her a voicemail with Dr. 4m recommendations as she is currently at work   Allergies  Allergen Reactions  . Morphine Itching  . Shrimp [Shellfish Allergy] Itching and Other (See Comments)    Tongue burns also  . Tramadol Other (See Comments)    Caused confusion  . Other Other (See Comments)  . Latex Rash    Immunization History  Administered Date(s) Administered  . PFIZER SARS-COV-2 Vaccination 04/25/2019, 05/18/2019  . Td 02/10/2002

## 2020-02-14 NOTE — Telephone Encounter (Signed)
Can't do cough syrup stronger than delsym otc s ov first   Ok to do Prednisone 10 mg take  4 each am x 2 days,   2 each am x 2 days,  1 each am x 2 days and stop   Needs to get back on advair asap also

## 2020-02-14 NOTE — Telephone Encounter (Signed)
Called and spoke with patient to let her know of recs from Dr. Sherene Sires. She asked to have RX for Advair and Prednisone sent to preferred pharmacy. Advised her that without OV the only cough syrup she could use is Delsym. She expressed understanding. RX has been sent to pharmacy. Nothing further needed at this time.

## 2020-02-27 ENCOUNTER — Telehealth: Payer: Self-pay

## 2020-02-27 MED ORDER — LOSARTAN POTASSIUM 100 MG PO TABS: 100.0000 mg | ORAL_TABLET | Freq: Every day | ORAL | 1 refills | Status: DC

## 2020-02-27 MED ORDER — HYDROCHLOROTHIAZIDE 25 MG PO TABS: 25.0000 mg | ORAL_TABLET | Freq: Every day | ORAL | 1 refills | Status: DC

## 2020-02-27 NOTE — Addendum Note (Signed)
Addended by: Biagio Borg on: 02/27/2020 06:07 PM   Modules accepted: Orders

## 2020-02-27 NOTE — Telephone Encounter (Signed)
Ok this is done 

## 2020-02-27 NOTE — Telephone Encounter (Signed)
Faxed request for alternative medication b/c the prescribed one is on backorder. Suggested alternative is hydrochlorothiazide 25mg /losartan potassium 100mg  oral tablet with substitutions permitted. Last OV: 07/21/19 (labs drawn) Last labs 12/22/19 (done in hospital)

## 2020-02-28 NOTE — Telephone Encounter (Signed)
Patient aware; she will check with pharmacy; she understands and plans to keep her follow up with Dr. Jenny Reichmann for 03/01/20.

## 2020-03-01 ENCOUNTER — Other Ambulatory Visit: Payer: Self-pay

## 2020-03-01 ENCOUNTER — Encounter: Payer: Self-pay | Admitting: Internal Medicine

## 2020-03-01 ENCOUNTER — Ambulatory Visit (INDEPENDENT_AMBULATORY_CARE_PROVIDER_SITE_OTHER): Payer: BC Managed Care – PPO | Admitting: Internal Medicine

## 2020-03-01 VITALS — BP 130/84 | HR 108 | Temp 98.1°F | Ht 61.0 in | Wt 298.0 lb

## 2020-03-01 DIAGNOSIS — E538 Deficiency of other specified B group vitamins: Secondary | ICD-10-CM | POA: Diagnosis not present

## 2020-03-01 DIAGNOSIS — Z0000 Encounter for general adult medical examination without abnormal findings: Secondary | ICD-10-CM

## 2020-03-01 DIAGNOSIS — E785 Hyperlipidemia, unspecified: Secondary | ICD-10-CM | POA: Diagnosis not present

## 2020-03-01 DIAGNOSIS — R7302 Impaired glucose tolerance (oral): Secondary | ICD-10-CM

## 2020-03-01 DIAGNOSIS — E559 Vitamin D deficiency, unspecified: Secondary | ICD-10-CM

## 2020-03-02 ENCOUNTER — Ambulatory Visit (INDEPENDENT_AMBULATORY_CARE_PROVIDER_SITE_OTHER): Payer: BC Managed Care – PPO | Admitting: Internal Medicine

## 2020-03-02 ENCOUNTER — Ambulatory Visit: Payer: BC Managed Care – PPO | Admitting: Internal Medicine

## 2020-03-02 ENCOUNTER — Telehealth: Payer: Self-pay | Admitting: Internal Medicine

## 2020-03-02 ENCOUNTER — Encounter: Payer: Self-pay | Admitting: Internal Medicine

## 2020-03-02 DIAGNOSIS — J4530 Mild persistent asthma, uncomplicated: Secondary | ICD-10-CM

## 2020-03-02 DIAGNOSIS — I2782 Chronic pulmonary embolism: Secondary | ICD-10-CM

## 2020-03-02 LAB — LIPID PANEL
Cholesterol: 169 mg/dL (ref 0–200)
HDL: 66.8 mg/dL (ref >39.00)
LDL Cholesterol: 90 mg/dL (ref 0–99)
NonHDL: 102.59
Total CHOL/HDL Ratio: 3
Triglycerides: 65 mg/dL (ref 0.0–149.0)
VLDL: 13 mg/dL (ref 0.0–40.0)

## 2020-03-02 LAB — BASIC METABOLIC PANEL
BUN: 17 mg/dL (ref 6–23)
CO2: 32 mEq/L (ref 19–32)
Calcium: 10.3 mg/dL (ref 8.4–10.5)
Chloride: 100 mEq/L (ref 96–112)
Creatinine, Ser: 1.05 mg/dL (ref 0.40–1.20)
GFR: 58.4 mL/min — ABNORMAL LOW (ref >60.00)
Glucose, Bld: 91 mg/dL (ref 70–99)
Potassium: 4.4 mEq/L (ref 3.5–5.1)
Sodium: 139 mEq/L (ref 135–145)

## 2020-03-02 LAB — TSH: TSH: 3.6 u[IU]/mL (ref 0.35–4.50)

## 2020-03-02 LAB — URINALYSIS, ROUTINE W REFLEX MICROSCOPIC
Bilirubin Urine: NEGATIVE
Ketones, ur: NEGATIVE
Nitrite: NEGATIVE
Specific Gravity, Urine: >=1.03 — AB (ref 1.000–1.030)
Total Protein, Urine: NEGATIVE
Urine Glucose: NEGATIVE
Urobilinogen, UA: 0.2 (ref 0.0–1.0)
pH: 5.5 (ref 5.0–8.0)

## 2020-03-02 LAB — CBC WITH DIFFERENTIAL/PLATELET
Basophils Absolute: 0.1 10*3/uL (ref 0.0–0.1)
Basophils Relative: 0.5 % (ref 0.0–3.0)
Eosinophils Absolute: 0.1 10*3/uL (ref 0.0–0.7)
Eosinophils Relative: 1.1 % (ref 0.0–5.0)
HCT: 45.3 % (ref 36.0–46.0)
Hemoglobin: 14.8 g/dL (ref 12.0–15.0)
Lymphocytes Relative: 14.1 % (ref 12.0–46.0)
Lymphs Abs: 1.9 10*3/uL (ref 0.7–4.0)
MCHC: 32.7 g/dL (ref 30.0–36.0)
MCV: 92.1 fl (ref 78.0–100.0)
Monocytes Absolute: 0.9 10*3/uL (ref 0.1–1.0)
Monocytes Relative: 6.5 % (ref 3.0–12.0)
Neutro Abs: 10.5 10*3/uL — ABNORMAL HIGH (ref 1.4–7.7)
Neutrophils Relative %: 77.8 % — ABNORMAL HIGH (ref 43.0–77.0)
Platelets: 259 10*3/uL (ref 150.0–400.0)
RBC: 4.92 Mil/uL (ref 3.87–5.11)
RDW: 14.6 % (ref 11.5–15.5)
WBC: 13.5 10*3/uL — ABNORMAL HIGH (ref 4.0–10.5)

## 2020-03-02 LAB — HEPATIC FUNCTION PANEL
ALT: 71 U/L — ABNORMAL HIGH (ref 0–35)
AST: 59 U/L — ABNORMAL HIGH (ref 0–37)
Albumin: 4 g/dL (ref 3.5–5.2)
Alkaline Phosphatase: 99 U/L (ref 39–117)
Bilirubin, Direct: 0.1 mg/dL (ref 0.0–0.3)
Total Bilirubin: 0.5 mg/dL (ref 0.2–1.2)
Total Protein: 7.7 g/dL (ref 6.0–8.3)

## 2020-03-02 LAB — HEMOGLOBIN A1C: Hgb A1c MFr Bld: 6.1 % (ref 4.6–6.5)

## 2020-03-02 LAB — VITAMIN D 25 HYDROXY (VIT D DEFICIENCY, FRACTURES): VITD: 19.29 ng/mL — ABNORMAL LOW (ref 30.00–100.00)

## 2020-03-02 LAB — VITAMIN B12: Vitamin B-12: 386 pg/mL (ref 211–911)

## 2020-03-02 MED ORDER — ADVAIR HFA 115-21 MCG/ACT IN AERO: 1.0000 | INHALATION_SPRAY | Freq: Two times a day (BID) | RESPIRATORY_TRACT | 3 refills | Status: DC

## 2020-03-02 NOTE — Progress Notes (Signed)
[name], female    DOB: 1960-06-09      MRN: [id number]   Brief patient profile:  89 yobf quit smoking in 1990 / MO asthma since childhood remotely seen by Tia Masker on advair 250 one daily and freq albuterol and then R Plantar fascitis around march 2020 and then toe surgery both sides May 6th 2020 then more sob May 15th > admitted 07/05/2018   Note spirometry 11/2012 nl    Admit date: 07/05/2018 Discharge date: 07/07/2018   Discharge Diagnoses:      Pulmonary embolism ([place])    Extrinsic asthma   Hypertension   Elevated troponin  Brief/Interim Summary:  60 y.o.femalewithhistory of hypertension, hyperlipidemia, asthma previous history of alcohol abuse states she only drinks occasionally nowadays presented to the ER because of nausea vomiting with some epigastric discomfort and burning sensation in the chest over the last 3 days PTA.     just recently had both  great toe nails removed in her left great toe nailbed has become more discolored but no discharge or pain.  ED Course:  CT angiogram of the chest which shows bilateral subsegmental pulmonary embolism with no strain pattern. Patient was started on heparin and admitted for further work-up. Labs also showed elevated LFTs with AST of 70 ALT of 55 bilirubin was 1.7. CT angiogram of the chest done for PE also showed persistent lesion in the liver which as per the radiologist was possible hemangioma.  Hospital course: Pulmonary embolism with no strain pattern per CT chest.  Echocardiogram was done consistent with no severe strain noted, was started on a heparin drip and converted over to Eliquis on discharge.  Dopplers were checked which were negative but TDS.   Elevated troponin; these were marginally elevated at 0.04 and 0.03 the setting of PE.  They remained flat, no acute ischemic changes on EKG.  Patient will follow-up with her PCP for continued outpatient monitoring and further coronary stratification with referral to  cardiology.    History of Present Illness  59/17/2020  Pulmonary/ 1st office eval/Mistina Coatney re-establish re chronic doe/new pe Chief Complaint  Patient presents with  . Consult    Pulmonary Embolism-H/O asthma has not needed rescue inhaler.  Dyspnea:  MMRC1 = can walk nl pace, flat grade, can't hurry or go uphills or steps s sob   Cough: none Sleep: on back / 2 pillows  SABA use: none on advair one daily Re Call me if trouble getting the eliquis - you need to be on it for 6 full months Weight control is simply a matter of calorie balance    01/14/2019  f/u ov/Kelsen Celona re: PE  Dx 06/2018 on eliquis, asthma much better  Chief Complaint  Patient presents with  . Follow-up    Breathing is doing well today. She is using her proair inhaler 3 x per wk on average.   Dyspnea:  MMRC1 = can walk nl pace, flat grade, can't hurry or go uphills or steps s sob   Cough: none Sleeping: 2 pillows  SABA use: as above  rec Only use your albuterol as a rescue medication  GERD diet    03/02/2020  f/u ov/Jocelyn Nold re:  PE / dx  06/2018 and mild asthma on advair  hfa 115 one bid  Chief Complaint  Patient presents with  . Follow-up    Breathing is overall doing well. She uses her albuterol inhaler about once per day. She states she has cough in the am- non prod.  Dyspnea: Not limited by breathing from desired activities but very sedentary   Cough: some throat clearing / some worse in am's attributes to overt HB  Sleeping: flat bed pillows  SABA use: rarely 02: none    No obvious day to day or daytime variability or assoc excess/ purulent sputum or mucus plugs or hemoptysis or cp or chest tightness, subjective wheeze or overt sinus  symptoms.   sleeping without nocturnal exacerbation  of respiratory  c/o's or need for noct saba. Also denies any obvious fluctuation of symptoms with weather or environmental changes or other aggravating or alleviating factors except as outlined above   No unusual exposure hx or  h/o childhood pna  or knowledge of premature birth.  Current Allergies, Complete Past Medical History, Past Surgical History, Family History, and Social History were reviewed in [employer] record.  ROS  The following are not active complaints unless bolded Hoarseness, sore throat, dysphagia, dental problems, itching, sneezing,  nasal congestion or discharge of excess mucus or purulent secretions, ear ache,   fever, chills, sweats, unintended wt loss or wt gain, classically pleuritic or exertional cp,  orthopnea pnd or arm/hand swelling  or leg swelling, presyncope, palpitations, abdominal pain, anorexia, nausea, vomiting, diarrhea  or change in bowel habits or change in bladder habits, change in stools or change in urine, dysuria, hematuria,  rash, arthralgias, visual complaints, headache, numbness, weakness or ataxia or problems with walking or coordination,  change in mood or  memory.        Current Meds  Medication Sig  . ADVAIR HFA 115-21 MCG/ACT inhaler Inhale 1 puff into the lungs 2 (two) times daily.  Marland Kitchen albuterol (VENTOLIN HFA) 108 (90 Base) MCG/ACT inhaler INHALE 2 PUFFS INTO THE LUNGS EVERY 6 HOURS AS NEEDED FOR SHORTNESS OF BREATH  . atorvastatin (LIPITOR) 20 MG tablet Take 1 tablet (20 mg total) by mouth daily. Hold until your liver enzymes have normalized.  . cimetidine (TAGAMET) 200 MG tablet Take 200 mg by mouth daily.   . cloNIDine (CATAPRES) 0.1 MG tablet Take 1 tablet (0.1 mg total) by mouth 2 (two) times daily.  Marland Kitchen ELIQUIS 5 MG TABS tablet TAKE 1 TABLET BY MOUTH TWICE A DAY  . KLOR-CON M20 20 MEQ tablet TAKE 1 TABLET BY MOUTH EVERY DAY (Patient taking differently: Take 10 mEq by mouth every other day.)  . losartan (COZAAR) 100 MG tablet Take 1 tablet (100 mg total) by mouth daily.  . Multiple Vitamin (MULTIVITAMIN WITH MINERALS) TABS tablet Take 1 tablet by mouth daily.  . ondansetron (ZOFRAN-ODT) 8 MG disintegrating tablet Take 1 tablet (8 mg total) by  mouth every 8 (eight) hours as needed for nausea or vomiting.  . triamcinolone (KENALOG) 0.1 % APPLY TO AFFECTED AREA TWICE A DAY                 Past Medical History:  Diagnosis Date  . Alcohol abuse    abuse- moderate years ago - states only drinks now on weekends -2 to 8 driinks   . Asthma   . COLONIC POLYPS, HX OF 04/05/2010  . DIVERTICULITIS, HX OF 04/05/2010  . DJD (degenerative joint disease)    right knee, mot to severe  . GERD (gastroesophageal reflux disease)    no meds  . Heart murmur    hx of   . Hyperlipidemia   . Hypertension   . Impaired glucose tolerance 12/06/2013  . Obesity (BMI 30-39.9)   . PALPITATIONS, HX OF  09/14/2007       Objective:        03/02/2020      300  01/14/19 290 lb (131.5 kg)  01/14/19 292 lb 9.6 oz (132.7 kg)  12/06/18 281 lb 9.6 oz (127.7 kg)      Vital signs reviewed  03/02/2020  - Note at rest 02 sats  98% on RA   General appearance:    Obese pleasant amb bf nad/ minimal pseudowheeze   HEENT : pt wearing mask not removed for exam due to covid -19 concerns.    NECK :  without JVD/Nodes/TM/ nl carotid upstrokes bilaterally   LUNGS: no acc muscle use,  Nl contour chest which is clear to A and P bilaterally without cough on insp or exp maneuvers   CV:  RRR  no s3 or murmur or increase in P2, and no edema   ABD:  Quite obese soft and nontender with nl inspiratory excursion in the supine position. No bruits or organomegaly appreciated, bowel sounds nl  MS:  Nl gait/ ext warm without deformities, calf tenderness, cyanosis or clubbing No obvious joint restrictions   SKIN: warm and dry without lesions    NEURO:  alert, approp, nl sensorium with  no motor or cerebellar deficits apparent.               Assessment

## 2020-03-02 NOTE — Assessment & Plan Note (Signed)
Asthma with atopic features and GERD as ppt factors in addition to [place]: 11/2012: FeV1 94% FVC 108%  All goals of chronic asthma control met including optimal function and elimination of symptoms with minimal need for rescue therapy.  Contingencies discussed in full including contacting this office immediately if not controlling the symptoms using the rule of two's.     F/u q 6 m

## 2020-03-02 NOTE — Patient Instructions (Addendum)
Ok to renew your Eliquis and keep working on the weight loss   To get the most out of exercise, you need to be continuously aware that you are short of breath, but never out of breath, for 30 minutes daily. As you improve, it will actually be easier for you to do the same amount of exercise  in  30 minutes so always push to the level where you are short of breath.    GERD (REFLUX)  is an extremely common cause of respiratory symptoms just like yours , many times with no obvious heartburn at all.    It can be treated with medication, but also with lifestyle changes including elevation of the head of your bed (ideally with 6- 8inch blocks under the headboard of your bed),  Smoking cessation, avoidance of late meals, excessive alcohol, and avoid fatty foods, chocolate, peppermint, colas, red wine, and acidic juices such as orange juice.  NO MINT OR MENTHOL PRODUCTS SO NO COUGH DROPS  USE SUGARLESS CANDY INSTEAD (Jolley ranchers or Stover's or Life Savers) or even ice chips will also do - the key is to swallow to prevent all throat clearing. NO OIL BASED VITAMINS - use powdered substitutes.  Avoid fish oil when coughing.     Please schedule a follow up office visit in 6 months , call sooner if needed

## 2020-03-02 NOTE — Assessment & Plan Note (Signed)
See CTa  07/06/18 with small clot burden, no RH strain and venous dopplers neg but note TDS due to body habitus and in setting of immobility from quarantine for corona virus/ MO with recent foot surgery can't r/o DVT    At risk due to MO and reluctant to use the lower dose of eliquis for prophylaxis now as this population wasn't part of the study  Option see hematology for w/u or continue full dose another 6 m  Discussed in detail all the  indications, usual  risks and alternatives  relative to the benefits with patient who agrees to proceed with the latter option  as outlined.

## 2020-03-02 NOTE — Assessment & Plan Note (Addendum)
Body mass index is 56.68 kg/m.  -  trending up  Lab Results  Component Value Date   TSH 3.824 09/24/2019     Contributing to gerd risk/ doe/reviewed the need and the process to achieve and maintain neg calorie balance > defer f/u primary care including intermittently monitoring thyroid status          Each maintenance medication was reviewed in detail including emphasizing most importantly the difference between maintenance and prns and under what circumstances the prns are to be triggered using an action plan format where appropriate.  Total time for H and P, chart review, counseling, reviewing hfa  device(s) and generating customized AVS unique to this office visit / same day charting  >  30 min

## 2020-03-02 NOTE — Telephone Encounter (Signed)
Patient calling to get her results of her labs from 01.20.22

## 2020-03-04 ENCOUNTER — Encounter: Payer: Self-pay | Admitting: Internal Medicine

## 2020-03-04 MED ORDER — LORAZEPAM 1 MG PO TABS: 1.0000 mg | ORAL_TABLET | Freq: Three times a day (TID) | ORAL | 1 refills | Status: DC

## 2020-03-04 MED ORDER — HYDROCOD POLST-CPM POLST ER 10-8 MG/5ML PO SUER: 5.0000 mL | Freq: Two times a day (BID) | ORAL | 0 refills | Status: DC | PRN

## 2020-03-04 MED ORDER — ONDANSETRON 8 MG PO TBDP: 8.0000 mg | ORAL_TABLET | Freq: Three times a day (TID) | ORAL | 2 refills | Status: DC | PRN

## 2020-03-04 NOTE — Progress Notes (Signed)
Established Patient Office Visit  Subjective:  Patient ID: [name], female    DOB: [birth year]  Age: 60 y.o. MRN: [id number]       Chief Complaint:: wellness exam        HPI:  [name] is a 60 y.o. female here for wellness exam , has no specific complaints, completed recent ETOH withdrawal, no ETOH since then [facility] Readings from Last 3 Encounters:  03/02/20 300 lb (136.1 kg)  03/01/20 298 lb (135.2 kg)  12/20/19 299 lb 6.4 oz (135.8 kg)   BP Readings from Last 3 Encounters:  03/02/20 112/80  03/01/20 130/84  12/23/19 (!) 141/89       Past Medical History:  Diagnosis Date  . Alcohol dependence ([place])   . Asthma   . COLONIC POLYPS, HX OF 04/05/2010  . DIVERTICULITIS, HX OF 04/05/2010  . DJD (degenerative joint disease)    right knee, mot to severe  . GERD (gastroesophageal reflux disease)    no meds  . Heart murmur    hx of   . Hyperlipidemia   . Hypertension   . Impaired glucose tolerance 12/06/2013  . Morbid obesity with BMI of 45.0-49.9, adult ([place])   . PALPITATIONS, HX OF 09/14/2007   Past Surgical History:  Procedure Laterality Date  . ABDOMINAL HYSTERECTOMY  age 16   fibroids  . COLONOSCOPY WITH PROPOFOL N/A 07/25/2016   Procedure: COLONOSCOPY WITH PROPOFOL;  Surgeon: Carol Ada, MD;  Location: [facility];  Service: Endoscopy;  Laterality: N/A;  . colonscopy     x 2  . IR RADIOLOGIST EVAL & MGMT  08/12/2016  . IR RADIOLOGIST EVAL & MGMT  08/21/2016  . KNEE ARTHROSCOPY     left   . TOTAL KNEE ARTHROPLASTY  07/29/2011   Procedure: TOTAL KNEE ARTHROPLASTY;  Surgeon: Mauri Pole, MD;  Location: [facility] OR[facility];  Service: Orthopedics;  Laterality: Right;  . TOTAL KNEE ARTHROPLASTY Left 12/14/2012   Procedure: LEFT TOTAL KNEE ARTHROPLASTY;  Surgeon: Mauri Pole, MD;  Location: [facility] OR[facility];  Service: Orthopedics;  Laterality: Left;    reports that she quit smoking about 32 years ago. Her smoking use included cigarettes. She has a 3.50 pack-year smoking history. She  has never used smokeless tobacco. She reports current alcohol use. She reports previous drug use. Drug: Marijuana. family history includes COPD in her father; Lymphoma in her sister; Stroke in her mother. Allergies  Allergen Reactions  . Morphine Itching  . Shrimp [Shellfish Allergy] Itching and Other (See Comments)    Tongue burns also  . Tramadol Other (See Comments)    Caused confusion  . Other Other (See Comments)  . Latex Rash   Current Outpatient Medications on File Prior to Visit  Medication Sig Dispense Refill  . albuterol (VENTOLIN HFA) 108 (90 Base) MCG/ACT inhaler INHALE 2 PUFFS INTO THE LUNGS EVERY 6 HOURS AS NEEDED FOR SHORTNESS OF BREATH 6.7 each 1  . atorvastatin (LIPITOR) 20 MG tablet Take 1 tablet (20 mg total) by mouth daily. Hold until your liver enzymes have normalized.    . cimetidine (TAGAMET) 200 MG tablet Take 200 mg by mouth daily.     . cloNIDine (CATAPRES) 0.1 MG tablet Take 1 tablet (0.1 mg total) by mouth 2 (two) times daily. 60 tablet 0  . ELIQUIS 5 MG TABS tablet TAKE 1 TABLET BY MOUTH TWICE A DAY 60 tablet 5  . KLOR-CON M20 20 MEQ tablet TAKE 1 TABLET BY MOUTH EVERY DAY (Patient taking  differently: Take 10 mEq by mouth every other day.) 90 tablet 2  . losartan (COZAAR) 100 MG tablet Take 1 tablet (100 mg total) by mouth daily. 90 tablet 1  . Multiple Vitamin (MULTIVITAMIN WITH MINERALS) TABS tablet Take 1 tablet by mouth daily.    Marland Kitchen triamcinolone (KENALOG) 0.1 % APPLY TO AFFECTED AREA TWICE A DAY 30 g 0   No current facility-administered medications on file prior to visit.        ROS:  All others reviewed and negative.  Objective        PE:  BP 130/84   Pulse (!) 108   Temp 98.1 F (36.7 C) (Oral)   Ht 5\' 1"  (1.549 m)   Wt 298 lb (135.2 kg)   SpO2 97%   BMI 56.31 kg/m                 Constitutional: Pt appears in NAD               HENT: Head: NCAT.                Right Ear: External ear normal.                 Left Ear: External ear normal.                 Eyes: . Pupils are equal, round, and reactive to light. Conjunctivae and EOM are normal               Nose: without d/c or deformity               Neck: Neck supple. Gross normal ROM               Cardiovascular: Normal rate and regular rhythm.                 Pulmonary/Chest: Effort normal and breath sounds without rales or wheezing.                Abd:  Soft, NT, ND, + BS, no organomegaly               Neurological: Pt is alert. At baseline orientation, motor grossly intact               Skin: Skin is warm. No rashes, no other new lesions, LE edema - none               Psychiatric: Pt behavior is normal without agitation   Assessment/Plan:  [name] is a 60 y.o. Black or African American [2] female with  has a past medical history of Alcohol dependence ([place]), Asthma, COLONIC POLYPS, HX OF (04/05/2010), DIVERTICULITIS, HX OF (04/05/2010), DJD (degenerative joint disease), GERD (gastroesophageal reflux disease), Heart murmur, Hyperlipidemia, Hypertension, Impaired glucose tolerance (12/06/2013), Morbid obesity with BMI of 45.0-49.9, adult ([place]), and PALPITATIONS, HX OF (09/14/2007).  Assessment Plan  See notes Labs/data reviewed for each problem:  Micro: none  Cardiac tracings I have personally interpreted today:  none  Pertinent Radiological findings (summarize): none   There are no preventive care reminders to display for this patient.  There are no preventive care reminders to display for this patient.  Lab Results  Component Value Date   TSH 3.60 03/01/2020   Lab Results  Component Value Date   WBC 13.5 (H) 03/01/2020   HGB 14.8 03/01/2020   HCT 45.3 03/01/2020   MCV 92.1 03/01/2020   PLT 259.0 03/01/2020  Lab Results  Component Value Date   NA 139 03/01/2020   K 4.4 03/01/2020   CO2 32 03/01/2020   GLUCOSE 91 03/01/2020   BUN 17 03/01/2020   CREATININE 1.05 03/01/2020   BILITOT 0.5 03/01/2020   ALKPHOS 99 03/01/2020   AST 59 (H) 03/01/2020    ALT 71 (H) 03/01/2020   PROT 7.7 03/01/2020   ALBUMIN 4.0 03/01/2020   CALCIUM 10.3 03/01/2020   ANIONGAP 8 12/22/2019   GFR 58.40 (L) 03/01/2020   Lab Results  Component Value Date   CHOL 169 03/01/2020   Lab Results  Component Value Date   HDL 66.80 03/01/2020   Lab Results  Component Value Date   LDLCALC 90 03/01/2020   Lab Results  Component Value Date   TRIG 65.0 03/01/2020   Lab Results  Component Value Date   CHOLHDL 3 03/01/2020   Lab Results  Component Value Date   HGBA1C 6.1 03/01/2020      Assessment & Plan:   Problem List Items Addressed This Visit      High   Preventative health care    Overall doing well, age appropriate education and counseling updated, referrals for preventative services and immunizations addressed, dietary and smoking counseling addressed, most recent labs reviewed.  I have personally reviewed and have noted:  1) the patient's medical and social history 2) The pt's use of alcohol, tobacco, and illicit drugs 3) The patient's current medications and supplements 4) Functional ability including ADL's, fall risk, home safety risk, hearing and visual impairment 5) Diet and physical activities 6) Evidence for depression or mood disorder 7) The patient's height, weight, and BMI have been recorded in the chart  I have made referrals, and provided counseling and education based on review of the above         Medium   Impaired glucose tolerance - Primary    Lab Results  Component Value Date   HGBA1C 6.1 03/01/2020   Stable, pt to continue current medical treatment none       Relevant Orders   Hemoglobin A1c (Completed)   Hyperlipidemia    Lab Results  Component Value Date   [place] 90 03/01/2020   Stable, pt to continue current statin lipitor 20       Relevant Orders   Lipid panel (Completed)   Hepatic function panel (Completed)   CBC with Differential/Platelet (Completed)   TSH (Completed)   Urinalysis, Routine w  reflex microscopic (Completed)   Basic metabolic panel (Completed)    Other Visit Diagnoses    B12 deficiency       Relevant Orders   Vitamin B12 (Completed)   Vitamin D deficiency       Relevant Orders   VITAMIN D 25 Hydroxy (Vit-D Deficiency, Fractures) (Completed)      Meds ordered this encounter  Medications  . chlorpheniramine-HYDROcodone (TUSSIONEX PENNKINETIC ER) 10-8 MG/5ML SUER    Sig: Take 5 mLs by mouth every 12 (twelve) hours as needed for cough.    Dispense:  140 mL    Refill:  0  . LORazepam (ATIVAN) 1 MG tablet    Sig: Take 1 tablet (1 mg total) by mouth every 8 (eight) hours.    Dispense:  60 tablet    Refill:  1  . ondansetron (ZOFRAN-ODT) 8 MG disintegrating tablet    Sig: Take 1 tablet (8 mg total) by mouth every 8 (eight) hours as needed for nausea or vomiting.    Dispense:  15 tablet  Refill:  2    Follow-up: Return in about 6 months (around 08/29/2020).   Cathlean Cower, MD 03/04/2020 9:16 PM [facility]

## 2020-03-04 NOTE — Patient Instructions (Signed)

## 2020-03-04 NOTE — Assessment & Plan Note (Signed)
Lab Results  Component Value Date   LDLCALC 90 03/01/2020   Stable, pt to continue current statin lipitor 20

## 2020-03-04 NOTE — Assessment & Plan Note (Signed)

## 2020-03-04 NOTE — Assessment & Plan Note (Signed)
Lab Results  Component Value Date   HGBA1C 6.1 03/01/2020   Stable, pt to continue current medical treatment none

## 2020-03-08 ENCOUNTER — Telehealth: Payer: Self-pay | Admitting: Podiatry

## 2020-03-08 NOTE — Telephone Encounter (Signed)
Pt called asking if you could fill out a form for her for her to get a permanent handicap license plate.

## 2020-03-09 NOTE — Telephone Encounter (Signed)
That is fine 

## 2020-03-13 ENCOUNTER — Telehealth: Payer: Self-pay | Admitting: Internal Medicine

## 2020-03-13 NOTE — Telephone Encounter (Signed)
Pt assistance forms for advair HFA completed and faxed to [place] 03/02/20- scanned

## 2020-04-01 ENCOUNTER — Other Ambulatory Visit: Payer: Self-pay

## 2020-04-01 ENCOUNTER — Encounter (HOSPITAL_COMMUNITY): Payer: Self-pay | Admitting: *Deleted

## 2020-04-01 ENCOUNTER — Inpatient Hospital Stay (HOSPITAL_COMMUNITY)
Admission: EM | Admit: 2020-04-01 | Discharge: 2020-04-04 | DRG: 897 | Disposition: A | Discharge status: Home / Self Care | Payer: BC Managed Care – PPO | Attending: Internal Medicine | Admitting: Internal Medicine

## 2020-04-01 DIAGNOSIS — Z7901 Long term (current) use of anticoagulants: Secondary | ICD-10-CM | POA: Diagnosis not present

## 2020-04-01 DIAGNOSIS — E785 Hyperlipidemia, unspecified: Secondary | ICD-10-CM | POA: Diagnosis present

## 2020-04-01 DIAGNOSIS — Z8701 Personal history of pneumonia (recurrent): Secondary | ICD-10-CM

## 2020-04-01 DIAGNOSIS — Z90710 Acquired absence of both cervix and uterus: Secondary | ICD-10-CM

## 2020-04-01 DIAGNOSIS — Z79899 Other long term (current) drug therapy: Secondary | ICD-10-CM | POA: Diagnosis not present

## 2020-04-01 DIAGNOSIS — N179 Acute kidney failure, unspecified: Secondary | ICD-10-CM | POA: Diagnosis not present

## 2020-04-01 DIAGNOSIS — Z20822 Contact with and (suspected) exposure to covid-19: Secondary | ICD-10-CM | POA: Diagnosis present

## 2020-04-01 DIAGNOSIS — K219 Gastro-esophageal reflux disease without esophagitis: Secondary | ICD-10-CM | POA: Diagnosis present

## 2020-04-01 DIAGNOSIS — I4891 Unspecified atrial fibrillation: Secondary | ICD-10-CM

## 2020-04-01 DIAGNOSIS — I10 Essential (primary) hypertension: Secondary | ICD-10-CM | POA: Diagnosis not present

## 2020-04-01 DIAGNOSIS — F1010 Alcohol abuse, uncomplicated: Secondary | ICD-10-CM

## 2020-04-01 DIAGNOSIS — Y900 Blood alcohol level of less than 20 mg/100 ml: Secondary | ICD-10-CM | POA: Diagnosis present

## 2020-04-01 DIAGNOSIS — Z91040 Latex allergy status: Secondary | ICD-10-CM

## 2020-04-01 DIAGNOSIS — I480 Paroxysmal atrial fibrillation: Secondary | ICD-10-CM | POA: Diagnosis present

## 2020-04-01 DIAGNOSIS — Z91013 Allergy to seafood: Secondary | ICD-10-CM

## 2020-04-01 DIAGNOSIS — Z8616 Personal history of COVID-19: Secondary | ICD-10-CM | POA: Diagnosis not present

## 2020-04-01 DIAGNOSIS — Z86711 Personal history of pulmonary embolism: Secondary | ICD-10-CM | POA: Diagnosis not present

## 2020-04-01 DIAGNOSIS — F10930 Alcohol use, unspecified with withdrawal, uncomplicated: Secondary | ICD-10-CM

## 2020-04-01 DIAGNOSIS — Z6841 Body Mass Index (BMI) 40.0 and over, adult: Secondary | ICD-10-CM | POA: Diagnosis not present

## 2020-04-01 DIAGNOSIS — E876 Hypokalemia: Secondary | ICD-10-CM | POA: Diagnosis not present

## 2020-04-01 DIAGNOSIS — Z885 Allergy status to narcotic agent status: Secondary | ICD-10-CM

## 2020-04-01 DIAGNOSIS — K760 Fatty (change of) liver, not elsewhere classified: Secondary | ICD-10-CM | POA: Diagnosis present

## 2020-04-01 DIAGNOSIS — J45909 Unspecified asthma, uncomplicated: Secondary | ICD-10-CM | POA: Diagnosis not present

## 2020-04-01 DIAGNOSIS — Z87891 Personal history of nicotine dependence: Secondary | ICD-10-CM

## 2020-04-01 DIAGNOSIS — Z7951 Long term (current) use of inhaled steroids: Secondary | ICD-10-CM

## 2020-04-01 DIAGNOSIS — F10239 Alcohol dependence with withdrawal, unspecified: Secondary | ICD-10-CM | POA: Diagnosis not present

## 2020-04-01 DIAGNOSIS — I4892 Unspecified atrial flutter: Secondary | ICD-10-CM | POA: Diagnosis not present

## 2020-04-01 DIAGNOSIS — F10230 Alcohol dependence with withdrawal, uncomplicated: Secondary | ICD-10-CM

## 2020-04-01 DIAGNOSIS — F10231 Alcohol dependence with withdrawal delirium: Secondary | ICD-10-CM | POA: Diagnosis not present

## 2020-04-01 HISTORY — DX: Unspecified atrial fibrillation: I48.91

## 2020-04-01 HISTORY — DX: Unspecified atrial flutter: I48.92

## 2020-04-01 LAB — CBC
HCT: 44.8 % (ref 36.0–46.0)
Hemoglobin: 15.2 g/dL — ABNORMAL HIGH (ref 12.0–15.0)
MCH: 30.6 pg (ref 26.0–34.0)
MCHC: 33.9 g/dL (ref 30.0–36.0)
MCV: 90.1 fL (ref 80.0–100.0)
Platelets: 282 10*3/uL (ref 150–400)
RBC: 4.97 MIL/uL (ref 3.87–5.11)
RDW: 14.1 % (ref 11.5–15.5)
WBC: 9.9 10*3/uL (ref 4.0–10.5)
nRBC: 0 % (ref 0.0–0.2)

## 2020-04-01 LAB — HEPATIC FUNCTION PANEL
ALT: 89 U/L — ABNORMAL HIGH (ref 0–44)
AST: 95 U/L — ABNORMAL HIGH (ref 15–41)
Albumin: 3.7 g/dL (ref 3.5–5.0)
Alkaline Phosphatase: 102 U/L (ref 38–126)
Bilirubin, Direct: 0.4 mg/dL — ABNORMAL HIGH (ref 0.0–0.2)
Indirect Bilirubin: 1.4 mg/dL — ABNORMAL HIGH (ref 0.3–0.9)
Total Bilirubin: 1.8 mg/dL — ABNORMAL HIGH (ref 0.3–1.2)
Total Protein: 7.8 g/dL (ref 6.5–8.1)

## 2020-04-01 LAB — URINALYSIS, ROUTINE W REFLEX MICROSCOPIC
Bilirubin Urine: NEGATIVE
Glucose, UA: NEGATIVE mg/dL
Ketones, ur: 20 mg/dL — AB
Leukocytes,Ua: NEGATIVE
Nitrite: NEGATIVE
Protein, ur: 100 mg/dL — AB
Specific Gravity, Urine: 1.034 — ABNORMAL HIGH (ref 1.005–1.030)
pH: 5 (ref 5.0–8.0)

## 2020-04-01 LAB — BASIC METABOLIC PANEL
Anion gap: 17 — ABNORMAL HIGH (ref 5–15)
BUN: 14 mg/dL (ref 6–20)
CO2: 23 mmol/L (ref 22–32)
Calcium: 9 mg/dL (ref 8.9–10.3)
Chloride: 98 mmol/L (ref 98–111)
Creatinine, Ser: 0.84 mg/dL (ref 0.44–1.00)
GFR, Estimated: >60 mL/min (ref >60)
Glucose, Bld: 79 mg/dL (ref 70–99)
Potassium: 3.8 mmol/L (ref 3.5–5.1)
Sodium: 138 mmol/L (ref 135–145)

## 2020-04-01 LAB — SARS CORONAVIRUS 2 (TAT 6-24 HRS): SARS Coronavirus 2: NEGATIVE

## 2020-04-01 LAB — I-STAT BETA HCG BLOOD, ED (MC, WL, AP ONLY): I-stat hCG, quantitative: <5 m[IU]/mL (ref <5)

## 2020-04-01 LAB — ETHANOL: Alcohol, Ethyl (B): <10 mg/dL (ref <10)

## 2020-04-01 MED ORDER — THIAMINE HCL 100 MG PO TABS
100.0000 mg | ORAL_TABLET | Freq: Every day | ORAL | Status: DC
  Administered 2020-04-01 – 2020-04-04 (×4): 100 mg via ORAL
  Filled 2020-04-01 (×4): qty 1

## 2020-04-01 MED ORDER — HYDROCOD POLST-CPM POLST ER 10-8 MG/5ML PO SUER
5.0000 mL | Freq: Two times a day (BID) | ORAL | Status: DC | PRN
  Administered 2020-04-03 – 2020-04-04 (×2): 5 mL via ORAL
  Filled 2020-04-01 (×2): qty 5

## 2020-04-01 MED ORDER — METOPROLOL TARTRATE 5 MG/5ML IV SOLN
5.0000 mg | Freq: Once | INTRAVENOUS | Status: AC
  Administered 2020-04-01: 5 mg via INTRAVENOUS
  Filled 2020-04-01: qty 5

## 2020-04-01 MED ORDER — METOPROLOL TARTRATE 25 MG PO TABS
50.0000 mg | ORAL_TABLET | Freq: Two times a day (BID) | ORAL | Status: DC
  Administered 2020-04-01 – 2020-04-03 (×4): 50 mg via ORAL
  Filled 2020-04-01 (×4): qty 2

## 2020-04-01 MED ORDER — CLONIDINE HCL 0.1 MG PO TABS
0.1000 mg | ORAL_TABLET | Freq: Every day | ORAL | Status: DC
  Administered 2020-04-02 – 2020-04-04 (×3): 0.1 mg via ORAL
  Filled 2020-04-01 (×3): qty 1

## 2020-04-01 MED ORDER — THIAMINE HCL 100 MG/ML IJ SOLN: 100.0000 mg | Freq: Every day | INTRAMUSCULAR | Status: DC

## 2020-04-01 MED ORDER — LOSARTAN POTASSIUM 50 MG PO TABS: 100.0000 mg | ORAL_TABLET | Freq: Every day | ORAL | Status: DC

## 2020-04-01 MED ORDER — ALBUTEROL SULFATE HFA 108 (90 BASE) MCG/ACT IN AERS
2.0000 | INHALATION_SPRAY | Freq: Four times a day (QID) | RESPIRATORY_TRACT | Status: DC | PRN
  Filled 2020-04-01: qty 6.7

## 2020-04-01 MED ORDER — ONDANSETRON HCL 4 MG/2ML IJ SOLN
4.0000 mg | Freq: Once | INTRAMUSCULAR | Status: AC
Start: 2020-04-01 — End: 2020-04-01
  Administered 2020-04-01: 4 mg via INTRAVENOUS
  Filled 2020-04-01: qty 2

## 2020-04-01 MED ORDER — ATORVASTATIN CALCIUM 10 MG PO TABS
20.0000 mg | ORAL_TABLET | Freq: Every day | ORAL | Status: DC
  Administered 2020-04-02 – 2020-04-04 (×3): 20 mg via ORAL
  Filled 2020-04-01 (×3): qty 2

## 2020-04-01 MED ORDER — ONDANSETRON 4 MG PO TBDP
8.0000 mg | ORAL_TABLET | Freq: Three times a day (TID) | ORAL | Status: DC | PRN
  Filled 2020-04-01: qty 2

## 2020-04-01 MED ORDER — SODIUM CHLORIDE 0.45 % IV SOLN: INTRAVENOUS | Status: DC

## 2020-04-01 MED ORDER — APIXABAN 5 MG PO TABS
5.0000 mg | ORAL_TABLET | Freq: Two times a day (BID) | ORAL | Status: DC
  Administered 2020-04-01 – 2020-04-04 (×6): 5 mg via ORAL
  Filled 2020-04-01 (×6): qty 1

## 2020-04-01 MED ORDER — HYDROCHLOROTHIAZIDE 25 MG PO TABS
25.0000 mg | ORAL_TABLET | Freq: Every day | ORAL | Status: DC
  Administered 2020-04-02: 25 mg via ORAL
  Filled 2020-04-01: qty 1

## 2020-04-01 MED ORDER — DIPHENHYDRAMINE HCL 50 MG/ML IJ SOLN
25.0000 mg | Freq: Once | INTRAMUSCULAR | Status: AC
  Administered 2020-04-01: 25 mg via INTRAVENOUS
  Filled 2020-04-01: qty 1

## 2020-04-01 MED ORDER — ACETAMINOPHEN 325 MG PO TABS: 650.0000 mg | ORAL_TABLET | ORAL | Status: DC | PRN

## 2020-04-01 MED ORDER — LACTATED RINGERS IV BOLUS
1000.0000 mL | Freq: Once | INTRAVENOUS | Status: AC
  Administered 2020-04-01: 1000 mL via INTRAVENOUS

## 2020-04-01 MED ORDER — LORAZEPAM 1 MG PO TABS
0.0000 mg | ORAL_TABLET | Freq: Two times a day (BID) | ORAL | Status: DC
  Administered 2020-04-03: 1 mg via ORAL
  Filled 2020-04-01: qty 1

## 2020-04-01 MED ORDER — LOSARTAN POTASSIUM 50 MG PO TABS
100.0000 mg | ORAL_TABLET | Freq: Every day | ORAL | Status: DC
  Administered 2020-04-02: 100 mg via ORAL
  Filled 2020-04-01 (×2): qty 2

## 2020-04-01 MED ORDER — ONDANSETRON HCL 4 MG/2ML IJ SOLN: 4.0000 mg | Freq: Four times a day (QID) | INTRAMUSCULAR | Status: DC | PRN

## 2020-04-01 MED ORDER — FAMOTIDINE 20 MG PO TABS
20.0000 mg | ORAL_TABLET | Freq: Every day | ORAL | Status: DC
  Administered 2020-04-02 – 2020-04-04 (×3): 20 mg via ORAL
  Filled 2020-04-01 (×3): qty 1

## 2020-04-01 MED ORDER — MOMETASONE FURO-FORMOTEROL FUM 200-5 MCG/ACT IN AERO
2.0000 | INHALATION_SPRAY | Freq: Two times a day (BID) | RESPIRATORY_TRACT | Status: DC
  Administered 2020-04-03 – 2020-04-04 (×3): 2 via RESPIRATORY_TRACT
  Filled 2020-04-01: qty 8.8

## 2020-04-01 MED ORDER — DILTIAZEM HCL-DEXTROSE 125-5 MG/125ML-% IV SOLN (PREMIX)
5.0000 mg/h | INTRAVENOUS | Status: DC
  Administered 2020-04-01: 5 mg/h via INTRAVENOUS
  Filled 2020-04-01: qty 125

## 2020-04-01 MED ORDER — LORAZEPAM 2 MG/ML IJ SOLN
0.0000 mg | Freq: Four times a day (QID) | INTRAMUSCULAR | Status: AC
  Administered 2020-04-01 (×2): 2 mg via INTRAVENOUS
  Filled 2020-04-01: qty 1
  Filled 2020-04-01: qty 2

## 2020-04-01 MED ORDER — POTASSIUM CHLORIDE CRYS ER 10 MEQ PO TBCR
10.0000 meq | EXTENDED_RELEASE_TABLET | ORAL | Status: DC
  Administered 2020-04-03: 10 meq via ORAL
  Filled 2020-04-01 (×2): qty 1

## 2020-04-01 MED ORDER — DILTIAZEM LOAD VIA INFUSION
20.0000 mg | Freq: Once | INTRAVENOUS | Status: AC
  Administered 2020-04-01: 20 mg via INTRAVENOUS
  Filled 2020-04-01: qty 20

## 2020-04-01 MED ORDER — LORAZEPAM 1 MG PO TABS
0.0000 mg | ORAL_TABLET | Freq: Four times a day (QID) | ORAL | Status: AC
  Administered 2020-04-02 (×4): 1 mg via ORAL
  Filled 2020-04-01 (×4): qty 1

## 2020-04-01 MED ORDER — LOSARTAN POTASSIUM-HCTZ 100-25 MG PO TABS: 1.0000 | ORAL_TABLET | Freq: Every day | ORAL | Status: DC

## 2020-04-01 MED ORDER — LORAZEPAM 2 MG/ML IJ SOLN: 0.0000 mg | Freq: Two times a day (BID) | INTRAMUSCULAR | Status: DC

## 2020-04-01 NOTE — ED Notes (Signed)
unsuxxessful attempt to give report charge nurse has not looked over the chart yet  Note time of assignment

## 2020-04-01 NOTE — ED Notes (Signed)
The pt went to the br  When she returned she sat down on the stretcher  And suddenly started moving both her arms in a twitching fashion this lasted for 15 minutes.  She immediately began talking after that episode thenwhile she was talking her rt arm was twitching for a few seconds.  Food given per ed providors order

## 2020-04-01 NOTE — ED Notes (Signed)
The cardizem has already been stopped at the first signs of problems

## 2020-04-01 NOTE — ED Notes (Signed)
The pt has remained off the monitor as she has walked around for the past 2 fours.  She now wants ro roll her hair before she gets hooked up again. Monitor bp and pulse ox

## 2020-04-01 NOTE — ED Provider Notes (Signed)
[place] EMERGENCY DEPARTMENT Provider Note   CSN: [id number] Arrival date & time: 04/01/20  0247     History Chief Complaint  Patient presents with  . Weakness    [name] is a 60 y.o. female.  HPI      [name] is a 60 y.o. female, with a history of asthma, alcohol abuse, hyperlipidemia, HTN, morbid obesity, presenting to the ED with nausea and nonbloody nonbilious vomiting beginning this morning.  She also endorses overall weakness, tremors, intermittent headaches, and visual hallucinations. She states she has been drinking a pint to 1/5 of liquor daily for the past 2 weeks.  She has a history of heavy alcohol intake, but went through a short dry spell.  Her last drink of alcohol was yesterday morning.  She states, "I stopped drinking because I know it is not good for me." Patient denies any history of withdrawal seizures or requirement for ICU admission due to alcohol withdrawal. She denies fever, chest pain, shortness of breath, abdominal pain, urinary symptoms, diarrhea, hematochezia/melena, seizures, or any other complaints.    Past Medical History:  Diagnosis Date  . Alcohol dependence ([place])   . Asthma   . COLONIC POLYPS, HX OF 04/05/2010  . DIVERTICULITIS, HX OF 04/05/2010  . DJD (degenerative joint disease)    right knee, mot to severe  . GERD (gastroesophageal reflux disease)    no meds  . Heart murmur    hx of   . Hyperlipidemia   . Hypertension   . Impaired glucose tolerance 12/06/2013  . Morbid obesity with BMI of 45.0-49.9, adult ([place] [address] [place] [address])   . PALPITATIONS, HX OF 09/14/2007    Patient Active Problem List   Diagnosis Date Noted  . Alcohol dependence with withdrawal ([place]) 12/19/2019  . History of pulmonary embolism 12/19/2019  . Alcohol withdrawal ([place]) 09/24/2019  . Alcoholic intoxication without complication ([place]) 96/29/5284  . Pneumonia due to COVID-19 virus 02/10/2019  . Pulmonary embolism ([place]) 07/06/2018  . Elevated  troponin 07/06/2018  . Leukocytosis 09/22/2017  . Abnormal urine color 09/22/2017  . Rash 09/22/2017  . Sepsis ([place]) 09/12/2016  . Colovesical fistula 09/12/2016  . Hypertension 07/28/2016  . Alcohol abuse 07/28/2016  . Abscess of bladder 07/28/2016  . Colonic diverticulum 07/28/2016  . Acute hypokalemia 07/28/2016  . Hyperlipidemia 07/10/2016  . Mass of left side of neck 07/10/2016  . Head and neck lymphadenopathy 07/10/2016  . Acute sinus infection 01/25/2016  . Eustachian tube disorder 01/25/2016  . Asthma exacerbation 05/18/2015  . Peripheral edema 03/16/2015  . Asthma 03/16/2015  . Right shoulder pain 03/16/2015  . Hypokalemia 10/04/2014  . Upper airway cough syndrome 10/03/2014  . Obesity, Class III, BMI 40-49.9 (morbid obesity) ([place]) 10/03/2014  . Lower back pain 05/25/2014  . Bilateral shoulder pain 05/25/2014  . Chest pain 04/14/2014  . Allergic rhinitis 12/06/2013  . Impaired glucose tolerance 12/06/2013  . Expected blood loss anemia 12/16/2012  . S/P left TKA 12/14/2012  . Goiter 11/26/2012  . Preop exam for internal medicine 11/26/2012  . Diarrhea 02/25/2012  . Vaginal bleeding 04/30/2011  . Colon polyps 02/10/2011  . Eczema 02/10/2011  . Depression 02/10/2011  . Preventative health care 09/27/2010  . Anxiety state 04/05/2010  . DIVERTICULITIS, HX OF 04/05/2010  . PALPITATIONS, HX OF 09/14/2007  . Essential hypertension 04/24/2007  . VOCAL CORD DISORDER 04/24/2007  . Extrinsic asthma 04/24/2007  . GERD 04/24/2007    Past Surgical History:  Procedure Laterality Date  .  ABDOMINAL HYSTERECTOMY  age 24   fibroids  . COLONOSCOPY WITH PROPOFOL N/A 07/25/2016   Procedure: COLONOSCOPY WITH PROPOFOL;  Surgeon: Carol Ada, MD;  Location: [facility];  Service: Endoscopy;  Laterality: N/A;  . colonscopy     x 2  . IR RADIOLOGIST EVAL & MGMT  08/12/2016  . IR RADIOLOGIST EVAL & MGMT  08/21/2016  . KNEE ARTHROSCOPY     left   . TOTAL KNEE ARTHROPLASTY   07/29/2011   Procedure: TOTAL KNEE ARTHROPLASTY;  Surgeon: Mauri Pole, MD;  Location: [facility];  Service: Orthopedics;  Laterality: Right;  . TOTAL KNEE ARTHROPLASTY Left 12/14/2012   Procedure: LEFT TOTAL KNEE ARTHROPLASTY;  Surgeon: Mauri Pole, MD;  Location: [facility];  Service: Orthopedics;  Laterality: Left;     OB History    Gravida  1   Para  1   Term      Preterm      AB      Living  1     SAB      IAB      Ectopic      Multiple      Live Births              Family History  Problem Relation Age of Onset  . Stroke Mother   . COPD Father   . Lymphoma Sister     Social History   Tobacco Use  . Smoking status: Former Smoker    Packs/day: 0.50    Years: 7.00    Pack years: 3.50    Types: Cigarettes    Quit date: 02/11/1988    Years since quitting: 32.1  . Smokeless tobacco: Never Used  Vaping Use  . Vaping Use: Never used  Substance Use Topics  . Alcohol use: Yes    Comment: dependence  . Drug use: Not Currently    Types: Marijuana    Comment: smoked marijuana x 10 years.  Quit in 1985.    Home Medications Prior to Admission medications   Medication Sig Start Date End Date Taking? Authorizing Provider  ADVAIR HFA 115-21 MCG/ACT inhaler Inhale 1 puff into the lungs 2 (two) times daily. 03/02/20  Yes Tanda Rockers, MD  albuterol (VENTOLIN HFA) 108 (90 Base) MCG/ACT inhaler INHALE 2 PUFFS INTO THE LUNGS EVERY 6 HOURS AS NEEDED FOR SHORTNESS OF BREATH 02/06/20  Yes Tanda Rockers, MD  atorvastatin (LIPITOR) 20 MG tablet Take 1 tablet (20 mg total) by mouth daily. Hold until your liver enzymes have normalized. 09/29/19 09/28/20 Yes Mikhail, Velta Addison, DO  chlorpheniramine-HYDROcodone (TUSSIONEX PENNKINETIC ER) 10-8 MG/5ML SUER Take 5 mLs by mouth every 12 (twelve) hours as needed for cough. 03/04/20  Yes Biagio Borg, MD  cimetidine (TAGAMET) 200 MG tablet Take 200 mg by mouth every other day.   Yes [provider]  cloNIDine (CATAPRES) 0.1  MG tablet Take 1 tablet (0.1 mg total) by mouth 2 (two) times daily. Patient taking differently: Take 0.1 mg by mouth daily. 09/29/19  Yes Mikhail, Maryann, DO  ELIQUIS 5 MG TABS tablet TAKE 1 TABLET BY MOUTH TWICE A DAY 01/25/20  Yes Tanda Rockers, MD  KLOR-CON M20 20 MEQ tablet TAKE 1 TABLET BY MOUTH EVERY DAY Patient taking differently: Take 10 mEq by mouth every other day. 04/26/19  Yes Biagio Borg, MD  losartan-hydrochlorothiazide (HYZAAR) 100-25 MG tablet Take 1 tablet by mouth daily. 03/11/20  Yes [provider]  ondansetron (ZOFRAN-ODT) 8 MG  disintegrating tablet Take 1 tablet (8 mg total) by mouth every 8 (eight) hours as needed for nausea or vomiting. 03/04/20  Yes Biagio Borg, MD  LORazepam (ATIVAN) 1 MG tablet Take 1 tablet (1 mg total) by mouth every 8 (eight) hours. Patient not taking: Reported on 04/01/2020 03/04/20   Biagio Borg, MD  losartan (COZAAR) 100 MG tablet Take 1 tablet (100 mg total) by mouth daily. Patient not taking: Reported on 04/01/2020 02/27/20   Biagio Borg, MD  Multiple Vitamin (MULTIVITAMIN WITH MINERALS) TABS tablet Take 1 tablet by mouth daily. Patient not taking: Reported on 04/01/2020 12/24/19   Eugenie Filler, MD  triamcinolone (KENALOG) 0.1 % APPLY TO AFFECTED AREA TWICE A DAY Patient not taking: Reported on 04/01/2020 02/14/20   Biagio Borg, MD    Allergies    Morphine, Shrimp [shellfish allergy], Tramadol, Other, and Latex  Review of Systems   Review of Systems  Constitutional: Negative for chills and fever.  Respiratory: Negative for cough and shortness of breath.   Cardiovascular: Negative for chest pain.  Gastrointestinal: Positive for nausea and vomiting. Negative for abdominal pain, blood in stool and diarrhea.  Neurological: Positive for tremors and weakness. Negative for seizures and syncope.  Psychiatric/Behavioral: Positive for hallucinations.  All other systems reviewed and are negative.   Physical Exam Updated Vital  Signs BP (!) 136/97   Pulse (!) 112   Temp 98 F (36.7 C) (Oral)   Resp (!) 21   Ht 5\' 1"  (1.549 m)   Wt 129.3 kg   SpO2 96%   BMI 53.85 kg/m   Physical Exam Vitals and nursing note reviewed.  Constitutional:      General: She is not in acute distress.    Appearance: She is well-developed. She is obese. She is ill-appearing. She is not diaphoretic.  HENT:     Head: Normocephalic and atraumatic.     Mouth/Throat:     Mouth: Mucous membranes are moist.     Pharynx: Oropharynx is clear.  Eyes:     Conjunctiva/sclera: Conjunctivae normal.  Cardiovascular:     Rate and Rhythm: Regular rhythm. Tachycardia present.     Pulses: Normal pulses.          Radial pulses are 2+ on the right side and 2+ on the left side.       Posterior tibial pulses are 2+ on the right side and 2+ on the left side.     Heart sounds: Normal heart sounds.     Comments: Tactile temperature in the extremities appropriate and equal bilaterally. Pulmonary:     Effort: Pulmonary effort is normal. No respiratory distress.     Breath sounds: Normal breath sounds.  Abdominal:     Palpations: Abdomen is soft.     Tenderness: There is no abdominal tenderness. There is no guarding.  Musculoskeletal:     Cervical back: Neck supple.     Right lower leg: No edema.     Left lower leg: No edema.  Skin:    General: Skin is warm and dry.  Neurological:     Mental Status: She is alert and oriented to person, place, and time.     Motor: Tremor present.     Comments: Patient is quite tremulous, even at rest.  When she attempts to perform actions, such as reaching out for my hands or grabbing the side rail, it appears to be difficult for her to adequately direct her movements. No noted acute  cognitive deficit. Sensation grossly intact to light touch in the extremities.   Grip strengths equal bilaterally.   Strength 4/5 in all extremities.   Cranial nerves III-XII grossly intact.  Handles oral secretions without noted  difficulty.  No noted phonation or speech deficit. No facial droop.   Psychiatric:        Mood and Affect: Mood and affect normal.        Speech: Speech normal.        Behavior: Behavior normal.     ED Results / Procedures / Treatments   Labs (all labs ordered are listed, but only abnormal results are displayed) Labs Reviewed  BASIC METABOLIC PANEL - Abnormal; Notable for the following components:      Result Value   Anion gap 17 (*)    All other components within normal limits  CBC - Abnormal; Notable for the following components:   Hemoglobin 15.2 (*)    All other components within normal limits  URINALYSIS, ROUTINE W REFLEX MICROSCOPIC - Abnormal; Notable for the following components:   Color, Urine AMBER (*)    APPearance TURBID (*)    Specific Gravity, Urine 1.034 (*)    Hgb urine dipstick MODERATE (*)    Ketones, ur 20 (*)    Protein, ur 100 (*)    Bacteria, UA FEW (*)    All other components within normal limits  SARS CORONAVIRUS 2 (TAT 6-24 HRS)  HEPATIC FUNCTION PANEL  ETHANOL  I-STAT BETA HCG BLOOD, ED ([facility], [facility], [facility] ONLY)    EKG None  Radiology No results found.  Procedures Procedures   Medications Ordered in ED Medications  LORazepam (ATIVAN) injection 0-4 mg (2 mg Intravenous Given 04/01/20 1424)    Or  LORazepam (ATIVAN) tablet 0-4 mg ( Oral See Alternative 04/01/20 1424)  LORazepam (ATIVAN) injection 0-4 mg (has no administration in time range)    Or  LORazepam (ATIVAN) tablet 0-4 mg (has no administration in time range)  thiamine tablet 100 mg (100 mg Oral Given 04/01/20 1426)    Or  thiamine (B-1) injection 100 mg ( Intravenous See Alternative 04/01/20 1426)  lactated ringers bolus 1,000 mL (1,000 mLs Intravenous New Bag/Given 04/01/20 1426)  ondansetron (ZOFRAN) injection 4 mg (4 mg Intravenous Given 04/01/20 1440)    ED Course  I have reviewed the triage vital signs and the nursing notes.  Pertinent labs & imaging results that were available  during my care of the patient were reviewed by me and considered in my medical decision making (see chart for details).  Clinical Course as of 04/01/20 1529  Sun Apr 01, 2020  1516 Care assumed at shift change CC: nausea, vomiting, tremors, visual hallucinations, headaches H/o ETOH abuse, stopped cold [place] yesterday 24 hr ago last drink Wants to quit CIWA 18 Persistently tachy/hypertensive, tremulous Anticipate admission [CG]  1526 I evaluated patient. Awake, fully conversant. Persistent tremulous in hands, lips and tongue. HR in 120-130s. Feels better, no recent vomiting. Reports recent visual hallucinations. Had been clean Nov 2021-January, relapsed this month. Very motivated to stop drinking. Had been going to the gym, walking and buying athletic clothes to exercise with her friend. Feels like this has been a significant step back in her recovery process [CG]  1527 Although she states some improvement in how she feels overall, patient continues to have nausea, tachycardia, HTN, tremors. [SJ]    Clinical Course User Index [CG] Kinnie Feil, PA-C [SJ] Janara Klett, Maceo Pro   MDM Rules/Calculators/A&P  Patient presents with tachycardia, hypertension, nausea/vomiting, tremors, hallucinations.  Initial CIWA of 18.  Suspect presentation consistent with alcohol withdrawal. No seizures or encephalopathy thus far.  Findings and plan of care discussed with attending physician, Theotis Burrow, MD. Dr. Rex Kras personally evaluated and examined this patient.   End of shift patient care handoff report given to Carmon Sails, PA-C. Plan: Patient to receive another dose of Ativan.  If she does not significantly improve, she will need to be admitted for further management.  Vitals:   04/01/20 1345 04/01/20 1348 04/01/20 1400 04/01/20 1430  BP: (!) 136/97 (!) 136/97 (!) 165/93 (!) 186/97  Pulse: (!) 112 (!) 112 (!) 116 (!) 112  Resp: (!) 21  20 (!) 24  Temp:       TempSrc:      SpO2: 96%  95% 94%  Weight:      Height:         Final Clinical Impression(s) / ED Diagnoses Final diagnoses:  None    Rx / DC Orders ED Discharge Orders    None       Layla Maw 04/01/20 1530    Little, Wenda Overland, MD 04/03/20 2006

## 2020-04-01 NOTE — ED Notes (Signed)
The pt keeps wandering around the department

## 2020-04-01 NOTE — H&P (Signed)
History and Physical    [name] EYC:[id number] DOB: 11/05/1960 DOA: 04/01/2020  PCP: Biagio Borg, MD (Confirm with patient/family/NH records and if not entered, this has to be entered at [facility] point of entry) Patient coming from: home  I have personally briefly reviewed patient's old medical records in [place]  Chief Complaint: Weakness, Alcohol detox  HPI: [name] is a 60 y.o. female with medical history significant of Alcohol abuse, asthma, obesity, HTN, HLD,h/o palpitation. She presents with overall weakness, tremors, intermittent headaches, and visual hallucinations. She states she has been drinking a pint to 1/5 of liquor daily for the past 2 weeks.  She has a history of heavy alcohol intake, but went through a short dry spell.  Her last drink of alcohol was yesterday morning.  She states, "I stopped drinking because I know it is not good for me." Patient denies any history of withdrawal seizures or requirement for ICU admission due to alcohol withdrawal. She denies fever, chest pain, shortness of breath, abdominal pain, urinary symptoms, diarrhea, hematochezia/melena, seizures, or any other complaints.   ED Course: T 98.1  162/96 HR 128  RR 27  BMI 54. Bmet nl, CBC nl, U/A negative. EtOH < 10. EKG reveals A fib with RVR.  CIWA protocol initiated in ED. TRH called to admit to manage A fib and EtOH withdrawal.   Review of Systems: As per HPI otherwise 10 point review of systems negative.    Past Medical History:  Diagnosis Date  . Alcohol dependence ([place])   . Asthma   . COLONIC POLYPS, HX OF 04/05/2010  . DIVERTICULITIS, HX OF 04/05/2010  . DJD (degenerative joint disease)    right knee, mot to severe  . GERD (gastroesophageal reflux disease)    no meds  . Heart murmur    hx of   . Hyperlipidemia   . Hypertension   . Impaired glucose tolerance 12/06/2013  . Morbid obesity with BMI of 45.0-49.9, adult ([place])   . PALPITATIONS, HX OF 09/14/2007    Past  Surgical History:  Procedure Laterality Date  . ABDOMINAL HYSTERECTOMY  age 18   fibroids  . COLONOSCOPY WITH PROPOFOL N/A 07/25/2016   Procedure: COLONOSCOPY WITH PROPOFOL;  Surgeon: Carol Ada, MD;  Location: [facility];  Service: Endoscopy;  Laterality: N/A;  . colonscopy     x 2  . IR RADIOLOGIST EVAL & MGMT  08/12/2016  . IR RADIOLOGIST EVAL & MGMT  08/21/2016  . KNEE ARTHROSCOPY     left   . TOTAL KNEE ARTHROPLASTY  07/29/2011   Procedure: TOTAL KNEE ARTHROPLASTY;  Surgeon: Mauri Pole, MD;  Location: [facility];  Service: Orthopedics;  Laterality: Right;  . TOTAL KNEE ARTHROPLASTY Left 12/14/2012   Procedure: LEFT TOTAL KNEE ARTHROPLASTY;  Surgeon: Mauri Pole, MD;  Location: [facility];  Service: Orthopedics;  Laterality: Left;    Soc Hx - married, has one son. Lives with her spouse. Sees Dr. Melvyn Novas for pulmonary and Dr. Jenny Reichmann for PCP    reports that she quit smoking about 32 years ago. Her smoking use included cigarettes. She has a 3.50 pack-year smoking history. She has never used smokeless tobacco. She reports current alcohol use. She reports previous drug use. Drug: Marijuana.  Allergies  Allergen Reactions  . Morphine Itching  . Shrimp [Shellfish Allergy] Itching and Other (See Comments)    Tongue burns also  . Tramadol Other (See Comments)    Caused confusion  . Other Other (  See Comments)  . Latex Rash    Family History  Problem Relation Age of Onset  . Stroke Mother   . COPD Father   . Lymphoma Sister     Prior to Admission medications   Medication Sig Start Date End Date Taking? Authorizing Provider  ADVAIR HFA 115-21 MCG/ACT inhaler Inhale 1 puff into the lungs 2 (two) times daily. 03/02/20  Yes Tanda Rockers, MD  albuterol (VENTOLIN HFA) 108 (90 Base) MCG/ACT inhaler INHALE 2 PUFFS INTO THE LUNGS EVERY 6 HOURS AS NEEDED FOR SHORTNESS OF BREATH 02/06/20  Yes Tanda Rockers, MD  atorvastatin (LIPITOR) 20 MG tablet Take 1 tablet (20 mg total) by mouth daily.  Hold until your liver enzymes have normalized. 09/29/19 09/28/20 Yes Mikhail, Velta Addison, DO  chlorpheniramine-HYDROcodone (TUSSIONEX PENNKINETIC ER) 10-8 MG/5ML SUER Take 5 mLs by mouth every 12 (twelve) hours as needed for cough. 03/04/20  Yes Biagio Borg, MD  cimetidine (TAGAMET) 200 MG tablet Take 200 mg by mouth every other day.   Yes [provider]  cloNIDine (CATAPRES) 0.1 MG tablet Take 1 tablet (0.1 mg total) by mouth 2 (two) times daily. Patient taking differently: Take 0.1 mg by mouth daily. 09/29/19  Yes Mikhail, Maryann, DO  ELIQUIS 5 MG TABS tablet TAKE 1 TABLET BY MOUTH TWICE A DAY 01/25/20  Yes Tanda Rockers, MD  KLOR-CON M20 20 MEQ tablet TAKE 1 TABLET BY MOUTH EVERY DAY Patient taking differently: Take 10 mEq by mouth every other day. 04/26/19  Yes Biagio Borg, MD  losartan-hydrochlorothiazide (HYZAAR) 100-25 MG tablet Take 1 tablet by mouth daily. 03/11/20  Yes [provider]  ondansetron (ZOFRAN-ODT) 8 MG disintegrating tablet Take 1 tablet (8 mg total) by mouth every 8 (eight) hours as needed for nausea or vomiting. 03/04/20  Yes Biagio Borg, MD  LORazepam (ATIVAN) 1 MG tablet Take 1 tablet (1 mg total) by mouth every 8 (eight) hours. Patient not taking: Reported on 04/01/2020 03/04/20   Biagio Borg, MD  losartan (COZAAR) 100 MG tablet Take 1 tablet (100 mg total) by mouth daily. Patient not taking: Reported on 04/01/2020 02/27/20   Biagio Borg, MD  Multiple Vitamin (MULTIVITAMIN WITH MINERALS) TABS tablet Take 1 tablet by mouth daily. Patient not taking: Reported on 04/01/2020 12/24/19   Eugenie Filler, MD  triamcinolone (KENALOG) 0.1 % APPLY TO AFFECTED AREA TWICE A DAY Patient not taking: Reported on 04/01/2020 02/14/20   Biagio Borg, MD    Physical Exam: Vitals:   04/01/20 1641 04/01/20 1645 04/01/20 1845 04/01/20 1900  BP:  (!) 162/96    Pulse: (!) 120 (!) 122 (!) 128   Resp:  18 (!) 25 (!) 27  Temp:      TempSrc:      SpO2:  97% 95%    Weight:      Height:         Vitals:   04/01/20 1641 04/01/20 1645 04/01/20 1845 04/01/20 1900  BP:  (!) 162/96    Pulse: (!) 120 (!) 122 (!) 128   Resp:  18 (!) 25 (!) 27  Temp:      TempSrc:      SpO2:  97% 95%   Weight:      Height:       General:  Morbidly obese, in no distressed but a little confused. Eyes: PERRL, lids and conjunctivae normal ENMT: Mucous membranes are moist. Posterior pharynx clear of any exudate or lesions.Normal dentition.  Neck: normal, supple, no masses, no thyromegaly Respiratory: clear to auscultation bilaterally, no wheezing, no crackles. Normal respiratory effort. No accessory muscle use.  Cardiovascular: Tachycardic to 130 with frequent PVCs vs a fib. , no murmurs / rubs / gallops. No extremity edema. 2+ pedal pulses. No carotid bruits.  Abdomen: Morbidly obese, BS positive  Musculoskeletal: no clubbing / cyanosis. No joint deformity upper and lower extremities. Good ROM, no contractures. Normal muscle tone.  Skin: no rashes, lesions, ulcers. No induration Neurologic: CN 2-12 grossly intact.  Strength 5/5 in all 4.  Psychiatric: Normal judgment and insight. Alert and oriented x 3. Normal mood. Admits to being shaky, having continued intermittent visual hallucinations.    Labs on Admission: I have personally reviewed following labs and imaging studies  CBC: Recent Labs  Lab 04/01/20 0310  WBC 9.9  HGB 15.2*  HCT 44.8  MCV 90.1  PLT 671   Basic Metabolic Panel: Recent Labs  Lab 04/01/20 0310  NA 138  K 3.8  CL 98  CO2 23  GLUCOSE 79  BUN 14  CREATININE 0.84  CALCIUM 9.0   GFR: Estimated Creatinine Clearance: 91.5 mL/min (by C-G formula based on SCr of 0.84 mg/dL). Liver Function Tests: Recent Labs  Lab 04/01/20 1430  AST 95*  ALT 89*  ALKPHOS 102  BILITOT 1.8*  PROT 7.8  ALBUMIN 3.7   No results for input(s): LIPASE, AMYLASE in the last 168 hours. No results for input(s): AMMONIA in the last 168 hours. Coagulation  Profile: No results for input(s): INR, PROTIME in the last 168 hours. Cardiac Enzymes: No results for input(s): CKTOTAL, CKMB, CKMBINDEX, TROPONINI in the last 168 hours. BNP (last 3 results) No results for input(s): PROBNP in the last 8760 hours. HbA1C: No results for input(s): HGBA1C in the last 72 hours. CBG: No results for input(s): GLUCAP in the last 168 hours. Lipid Profile: No results for input(s): CHOL, HDL, LDLCALC, TRIG, CHOLHDL, LDLDIRECT in the last 72 hours. Thyroid Function Tests: No results for input(s): TSH, T4TOTAL, FREET4, T3FREE, THYROIDAB in the last 72 hours. Anemia Panel: No results for input(s): VITAMINB12, FOLATE, FERRITIN, TIBC, IRON, RETICCTPCT in the last 72 hours. Urine analysis:    Component Value Date/Time   COLORURINE AMBER (A) 04/01/2020 0300   APPEARANCEUR TURBID (A) 04/01/2020 0300   LABSPEC 1.034 (H) 04/01/2020 0300   PHURINE 5.0 04/01/2020 0300   GLUCOSEU NEGATIVE 04/01/2020 0300   GLUCOSEU NEGATIVE 03/01/2020 1641   HGBUR MODERATE (A) 04/01/2020 0300   BILIRUBINUR NEGATIVE 04/01/2020 0300   BILIRUBINUR negative 09/16/2011 1047   KETONESUR 20 (A) 04/01/2020 0300   PROTEINUR 100 (A) 04/01/2020 0300   UROBILINOGEN 0.2 03/01/2020 1641   NITRITE NEGATIVE 04/01/2020 0300   LEUKOCYTESUR NEGATIVE 04/01/2020 0300    Radiological Exams on Admission: No results found.  EKG: Independently reviewed. A fib with RVR  Assessment/Plan Active Problems:   Alcohol abuse   Atrial fibrillation with RVR (HCC)   Essential hypertension   Obesity, Class III, BMI 40-49.9 (morbid obesity) ([place])   Extrinsic asthma   GERD   Atrial flutter with rapid ventricular response ([place])    1. A fib with RVR - patient with new onset A fib with RVR. May be related to EtOH withdrawal. Tolerating high rate well with elevated BP. She is already taking Eliquis for h/o PE Plan Tele admit  diltiaze bolus and infusion. Once rate controlled will switch to PO  Monitor  BP  When rate is down - will need Echo  2. Alcohol withdrawal/abuse - heavy drinking h/o. She denied previous hospitalizations but was admitted 12/23/19 for withdrawal symptoms and has a h/o withdrawal Seizures Plan CIWA protocol  TOC  3. Asthma - stable w/o symptoms. Continue home meds  4. HTN - hypertensive 2/2 #2.  Plan Continue home meds  CCB being added for #1  Monitor BP on increased meds  5. GERD - continue H2 blocker.   DVT prophylaxis: on Eliquis  Code Status: full code  Family Communication: Spoke with Wilma Flavin, husband. Explained Dx and Tx plan  Disposition Plan: home when stable (specify when and where you expect patient to be discharged) Consults called: none (with names) Admission status: inpatient tele    Adella Hare MD [facility] Pager [phone]  If 7PM-7AM, please contact night-coverage [email or web address] Password [email or web address]  04/01/2020, 8:32 PM

## 2020-04-01 NOTE — ED Triage Notes (Signed)
Pt states that she has been drinking about a fifth a day since the 17th. Last drink was yesterday evening. Says she feels very weak, has not been eating much, has been drinking Pedialyte to stay hydrated. Denies SI.

## 2020-04-01 NOTE — ED Notes (Signed)
cardizem drip bolus started and maybe half of the 20 mg bolus infused when the pt started itching on the bottoms of her feet the allover.  pharmact contacted,  sfmitting doctor paged  Dr Nichola Sizer called back given order to stop the cardizem drip and orders for  An allergic reaction

## 2020-04-01 NOTE — ED Notes (Signed)
The pt reports that she has not had an appetite but she has been calling for numbners to food  Places  To order out  And asking if she will get food from the kitchen  She also reports seeing her husband standing next to the bed   She called out saying that she was stuck in the bed  She was lying backyard but came up easily with a hand up

## 2020-04-01 NOTE — ED Notes (Signed)
The  Pt reports that her iv came out. Walking to the br c/o no one wanting to help her although they did not say that to her .  She has been walking  To the br all day  Report from off going nurse

## 2020-04-01 NOTE — ED Notes (Signed)
Pt complained of abdominal pain, saying: "my belly is swelled like never before".

## 2020-04-01 NOTE — ED Notes (Signed)
Patient states she hasnt eaten in two days and feels like she is going to pass out. Vitals updated and triage RN notified

## 2020-04-01 NOTE — ED Provider Notes (Signed)
  Physical Exam  BP (!) 162/96   Pulse (!) 122   Temp 98 F (36.7 C) (Oral)   Resp 18   Ht 5\' 1"  (1.549 m)   Wt 129.3 kg   SpO2 97%   BMI 53.85 kg/m    ED Course/Procedures   Clinical Course as of 04/01/20 1754  Sun Apr 01, 2020  1516 Care assumed at shift change CC: nausea, vomiting, tremors, visual hallucinations, headaches H/o ETOH abuse, stopped cold [place] yesterday 24 hr ago last drink Wants to quit CIWA 18 Persistently tachy/hypertensive, tremulous Anticipate admission [CG]  1526 I evaluated patient. Awake, fully conversant. Persistent tremulous in hands, lips and tongue. HR in 120-130s. Feels better, no recent vomiting. Reports recent visual hallucinations. Had been clean Nov 2021-January, relapsed this month. Very motivated to stop drinking. Had been going to the gym, walking and buying athletic clothes to exercise with her friend. Feels like this has been a significant step back in her recovery process [CG]  1527 Although she states some improvement in how she feels overall, patient continues to have nausea, tachycardia, HTN, tremors. [SJ]  [name] score 4 per RN.  2 mg ativan given at 1711.  I re-evaluated patient. She is fidgeting with the room phone. States she is trying to call her son and husband. Looking behind her shoulder at "little boy" behind her. HR 15-130s. Tremulous in hands. Hypertensive. No diaphoresis. No longer vomiting, ate dinner.  [CG]    Clinical Course User Index [CG] Kinnie Feil, PA-C [SJ] Joy, Shawn C, PA-C    .Critical Care Performed by: Kinnie Feil, PA-C Authorized by: Kinnie Feil, PA-C   Critical care provider statement:    Critical care time (minutes):  45   Critical care was necessary to treat or prevent imminent or life-threatening deterioration of the following conditions: ETOH withdrawal.   Critical care was time spent personally by me on the following activities:  Discussions with consultants, evaluation of  patient's response to treatment, examination of patient, ordering and performing treatments and interventions, ordering and review of laboratory studies, ordering and review of radiographic studies, pulse oximetry, re-evaluation of patient's condition, obtaining history from patient or surrogate, review of old charts and development of treatment plan with patient or surrogate   I assumed direction of critical care for this patient from another provider in my specialty: yes      MDM   1755: Re-evaluated patient, CIWA improving but persistently tachycardic, hypertensive, tremulous despite 1 L IVF, 4 mg ativan. Reports seeing "little boy" in corner of room. Nausea and vomiting improved, and otherwise appeas well. However, given persistent tachycardia, hallucinations, history of alcohol withdrawals with seizures, DTs, previous ICU admission reasonable to request admission. She is motivated to stop drinking.       Kinnie Feil, PA-C 04/01/20 1756    Luna Fuse, MD 04/01/20 2213

## 2020-04-01 NOTE — ED Notes (Signed)
The nurse tech found the pt sitting on the bed with the iv pulled out and the pt was just watching the iv fluid run slowly onto the stretcher

## 2020-04-01 NOTE — ED Notes (Signed)
This tech came in the room to hook back on the monitor. Pt states she sees a person shot, sitting down from the chair across from her.

## 2020-04-01 NOTE — ED Notes (Signed)
See current vitals

## 2020-04-02 ENCOUNTER — Encounter (HOSPITAL_COMMUNITY): Payer: Self-pay | Admitting: Internal Medicine

## 2020-04-02 DIAGNOSIS — I4892 Unspecified atrial flutter: Secondary | ICD-10-CM | POA: Diagnosis not present

## 2020-04-02 DIAGNOSIS — F10231 Alcohol dependence with withdrawal delirium: Secondary | ICD-10-CM

## 2020-04-02 DIAGNOSIS — I4891 Unspecified atrial fibrillation: Secondary | ICD-10-CM | POA: Diagnosis not present

## 2020-04-02 DIAGNOSIS — F10230 Alcohol dependence with withdrawal, uncomplicated: Secondary | ICD-10-CM | POA: Diagnosis not present

## 2020-04-02 DIAGNOSIS — Z86711 Personal history of pulmonary embolism: Secondary | ICD-10-CM

## 2020-04-02 LAB — BASIC METABOLIC PANEL
Anion gap: 7 (ref 5–15)
BUN: 24 mg/dL — ABNORMAL HIGH (ref 6–20)
CO2: 27 mmol/L (ref 22–32)
Calcium: 8.9 mg/dL (ref 8.9–10.3)
Chloride: 99 mmol/L (ref 98–111)
Creatinine, Ser: 1.32 mg/dL — ABNORMAL HIGH (ref 0.44–1.00)
GFR, Estimated: 47 mL/min — ABNORMAL LOW (ref >60)
Glucose, Bld: 179 mg/dL — ABNORMAL HIGH (ref 70–99)
Potassium: 3.8 mmol/L (ref 3.5–5.1)
Sodium: 133 mmol/L — ABNORMAL LOW (ref 135–145)

## 2020-04-02 LAB — TSH: TSH: 1.904 u[IU]/mL (ref 0.350–4.500)

## 2020-04-02 NOTE — ED Notes (Signed)
Pt is refusing to lay in bed. Pt is up in chair.

## 2020-04-02 NOTE — ED Notes (Signed)
Dr Lynnette Caffey admit Breakfast Orders Placed

## 2020-04-02 NOTE — Progress Notes (Signed)
Progress Note    [name]  XHB:[id number] DOB: January 03, 1961  DOA: 04/01/2020 PCP: Susan Borg, MD    Brief Narrative:   Chief complaint: Weakness, requesting detox.  Medical records reviewed and are as summarized below:  [name] is an 60 y.o. female with a PMH of alcohol abuse, asthma, obesity, HTN, HLD, h/o palpitations who presented to the ED with a chief complaint of overall weakness, tremors, intermittent headaches, and visual hallucinations. She reported that she has been drinking a pint to 1/5 of liquor daily for the past 2 weeks. In the ED, noted to be in afib with RVR.  CIWA initiated.   Assessment/Plan:   Principal Problem:   Alcohol abuse/dependence with withdrawal, associated with elevated LFTs and fatty liver ([place]), POA H/O heavy ETOH use. Noted h/o ETOH withdrawal seizures. Mildly tremulous on exam. Continue CIWA protocol. TOC consult for substance abuse counseling/resources. CIWA score 9. Continue ativan detox. Continue thiamine/folic acid supplements. LFT elevation noted. Has a h/o fatty liver by US done 5/20. Recommend outpatient f/u and repeat US.  Active Problems:    H/O PE associated with hypoxia and tachypnea, POA Continue Eliquis. Has been intermittently hypoxic and tachypneic.  Concerned that she may not be taking Eliquis as prescribed, but would not repeat CTA chest unless condition deteriorates since it will not change management.    Essential hypertension, chronic, POA BP 140's/60's-90's. Continue clonidine, metoprolol.     Extrinsic asthma, chronic, POA Continue Albuterol PRN and Dulera.    GERD, chronic, POA Continue Pepcid.    Atrial fibrillation with RVR (HCC)/Atrial flutter with rapid ventricular response ([place]), acute, POA HR 100. Already on Eliquis due to h/o PE. Unable to tolerate Cardizem due to pruritis. Will titrate up metoprolol. TSH 1.904.    Obesity, Class III, BMI 40-49.9 (morbid obesity) ([place]), chronic, POA  Body mass  index is 53.85 kg/m.   Family Communication/Anticipated D/C date and plan/Code Status   DVT prophylaxis:  apixaban (ELIQUIS) tablet 5 mg   Current Level of Care::Telemetry Cardiac Code Status: Full Code.  Family Communication: No family at bedside. Disposition Plan: Status is: Inpatient  Remains inpatient appropriate because:VS unstable in the setting of new afib and ETOH withdrawal.   Dispo: The patient is from: Home              Anticipated d/c is to: Home              Anticipated d/c date is: 2 days              Patient currently is not medically stable to d/c.   Difficult to place patient No  Medical Consultants:    None.   Anti-Infectives:    None  Subjective:   Mildly tremulous.  Denies current nausea/vomiting but has had some nausea recently.  Says she wants to get clean from ETOH.   Objective:    Vitals:   04/01/20 2300 04/02/20 0100 04/02/20 0430 04/02/20 0500  BP: (!) 145/93 136/72 (!) 141/66 (!) 141/66  Pulse: 100 91 100 100  Resp: (!) 25 (!) 21  (!) 24  Temp:      TempSrc:      SpO2: 93% 94%  98%  Weight:      Height:       No intake or output data in the 24 hours ending 04/02/20 0843 Filed Weights   04/01/20 0255  Weight: 129.3 kg    Exam: General: No acute distress. Obese,  ambulatory. Cardiovascular: Heart sounds show a regular rate, and rhythm. No gallops or rubs. No murmurs. No JVD. Lungs: Clear to auscultation bilaterally with good air movement. No rales, rhonchi or wheezes. Abdomen: Soft, nontender, nondistended with normal active bowel sounds. No masses. No hepatosplenomegaly. Neurological: Alert and oriented 3. Moves all extremities 4 with equal strength. Cranial nerves II through XII grossly intact. Skin: Warm and dry. No rashes or lesions. Extremities: No clubbing or cyanosis. 1+ edema. Pedal pulses 2+. Psychiatric: Mood and affect are depressed. Insight and judgment are impaired.  Data Reviewed:   I have personally  reviewed following labs and imaging studies:  Labs: Labs show the following:   Basic Metabolic Panel: Recent Labs  Lab 04/01/20 0310 04/02/20 0452  NA 138 133*  K 3.8 3.8  CL 98 99  CO2 23 27  GLUCOSE 79 179*  BUN 14 24*  CREATININE 0.84 1.32*  CALCIUM 9.0 8.9   GFR Estimated Creatinine Clearance: 58.2 mL/min (A) (by C-G formula based on SCr of 1.32 mg/dL (H)). Liver Function Tests: Recent Labs  Lab 04/01/20 1430  AST 95*  ALT 89*  ALKPHOS 102  BILITOT 1.8*  PROT 7.8  ALBUMIN 3.7   CBC: Recent Labs  Lab 04/01/20 0310  WBC 9.9  HGB 15.2*  HCT 44.8  MCV 90.1  PLT 282   Thyroid function studies: Recent Labs    04/01/20 2008/05/30  TSH 1.904    Microbiology Recent Results (from the past 240 hour(s))  SARS CORONAVIRUS 2 (TAT 6-24 HRS) Nasopharyngeal Nasopharyngeal Swab     Status: None   Collection Time: 04/01/20  2:30 PM   Specimen: Nasopharyngeal Swab  Result Value Ref Range Status   SARS Coronavirus 2 NEGATIVE NEGATIVE Final    Comment: (NOTE) SARS-CoV-2 target nucleic acids are NOT DETECTED.  The SARS-CoV-2 RNA is generally detectable in upper and lower respiratory specimens during the acute phase of infection. Negative results do not preclude SARS-CoV-2 infection, do not rule out co-infections with other pathogens, and should not be used as the sole basis for treatment or other patient management decisions. Negative results must be combined with clinical observations, patient history, and epidemiological information. The expected result is Negative.  Fact Sheet for Patients: SugarRoll.be  Fact Sheet for Healthcare Providers: https://www.woods-mathews.com/  This test is not yet approved or cleared by the [place] FDA and  has been authorized for detection and/or diagnosis of SARS-CoV-2 by FDA under an Emergency Use Authorization (EUA). This EUA will remain  in effect (meaning this test can be used) for  the duration of the COVID-19 declaration under Se ction 564(b)(1) of the Act, 21 U.S.C. section 360bbb-3(b)(1), unless the authorization is terminated or revoked sooner.  Performed at [facility], [place] [address] [place], [place] [address]     Procedures and diagnostic studies:  No results found.  Medications:   . apixaban  5 mg Oral BID  . atorvastatin  20 mg Oral Daily  . cloNIDine  0.1 mg Oral Daily  . famotidine  20 mg Oral Daily  . losartan  100 mg Oral Daily   And  . hydrochlorothiazide  25 mg Oral Daily  . LORazepam  0-4 mg Intravenous Q6H   Or  . LORazepam  0-4 mg Oral Q6H  . [START ON 04/03/2020] LORazepam  0-4 mg Intravenous Q12H   Or  . [START ON 04/03/2020] LORazepam  0-4 mg Oral Q12H  . metoprolol tartrate  50 mg Oral BID  . mometasone-formoterol  2 puff Inhalation BID  . potassium chloride SA  10 mEq Oral QODAY  . thiamine  100 mg Oral Daily   Or  . thiamine  100 mg Intravenous Daily   Continuous Infusions: . sodium chloride 50 mL/hr at 04/01/20 2159     LOS: 1 day   Jacquelynn Cree, MD  [facility]   [facility] How to contact the [facility] Attending or [place] or covering provider during after hours [place], for this patient?  1. Check the care team in [facility] and look for a) attending/consulting [facility] provider listed and b) the [facility] team listed 2. Log into [email or web address] and use [place]'s universal password to access. If you do not have the password, please contact the hospital operator. 3. Locate the [facility] provider you are looking for under [facility] and page to a number that you can be directly reached. 4. If you still have difficulty reaching the provider, please page the [facility] (Director on Call) for the Hospitalists listed on amion for assistance.  04/02/2020, 8:43 AM

## 2020-04-02 NOTE — ED Notes (Signed)
6e has sent this bed away and has accepted a pt from [facility] instead  I was not called   I found out when I called to try and give report  What happened to professional courtesy????

## 2020-04-03 ENCOUNTER — Inpatient Hospital Stay (HOSPITAL_COMMUNITY): Payer: BC Managed Care – PPO

## 2020-04-03 DIAGNOSIS — I4891 Unspecified atrial fibrillation: Secondary | ICD-10-CM | POA: Diagnosis not present

## 2020-04-03 DIAGNOSIS — F10231 Alcohol dependence with withdrawal delirium: Secondary | ICD-10-CM | POA: Diagnosis not present

## 2020-04-03 DIAGNOSIS — I480 Paroxysmal atrial fibrillation: Secondary | ICD-10-CM

## 2020-04-03 DIAGNOSIS — F1010 Alcohol abuse, uncomplicated: Secondary | ICD-10-CM | POA: Diagnosis not present

## 2020-04-03 LAB — COMPREHENSIVE METABOLIC PANEL
ALT: 84 U/L — ABNORMAL HIGH (ref 0–44)
AST: 103 U/L — ABNORMAL HIGH (ref 15–41)
Albumin: 3.4 g/dL — ABNORMAL LOW (ref 3.5–5.0)
Alkaline Phosphatase: 96 U/L (ref 38–126)
Anion gap: 14 (ref 5–15)
BUN: 21 mg/dL — ABNORMAL HIGH (ref 6–20)
CO2: 24 mmol/L (ref 22–32)
Calcium: 9.2 mg/dL (ref 8.9–10.3)
Chloride: 98 mmol/L (ref 98–111)
Creatinine, Ser: 1.06 mg/dL — ABNORMAL HIGH (ref 0.44–1.00)
GFR, Estimated: >60 mL/min (ref >60)
Glucose, Bld: 153 mg/dL — ABNORMAL HIGH (ref 70–99)
Potassium: 3.2 mmol/L — ABNORMAL LOW (ref 3.5–5.1)
Sodium: 136 mmol/L (ref 135–145)
Total Bilirubin: 1 mg/dL (ref 0.3–1.2)
Total Protein: 7.3 g/dL (ref 6.5–8.1)

## 2020-04-03 LAB — HEPATITIS PANEL, ACUTE
HCV Ab: NONREACTIVE
Hep A IgM: NONREACTIVE
Hep B C IgM: NONREACTIVE
Hepatitis B Surface Ag: NONREACTIVE

## 2020-04-03 LAB — MAGNESIUM: Magnesium: 1.9 mg/dL (ref 1.7–2.4)

## 2020-04-03 LAB — ECHOCARDIOGRAM COMPLETE
Area-P 1/2: 2.42 cm2
Height: 61 in
S' Lateral: 4.2 cm
Weight: 4560 oz

## 2020-04-03 MED ORDER — POTASSIUM CHLORIDE 20 MEQ PO PACK
40.0000 meq | PACK | Freq: Two times a day (BID) | ORAL | Status: DC
  Administered 2020-04-03: 40 meq via ORAL
  Filled 2020-04-03 (×2): qty 2

## 2020-04-03 MED ORDER — FOLIC ACID 1 MG PO TABS
1.0000 mg | ORAL_TABLET | Freq: Every day | ORAL | Status: DC
  Administered 2020-04-03 – 2020-04-04 (×2): 1 mg via ORAL
  Filled 2020-04-03 (×2): qty 1

## 2020-04-03 MED ORDER — POTASSIUM CHLORIDE CRYS ER 20 MEQ PO TBCR
40.0000 meq | EXTENDED_RELEASE_TABLET | Freq: Two times a day (BID) | ORAL | Status: AC
  Administered 2020-04-03: 40 meq via ORAL
  Filled 2020-04-03: qty 2

## 2020-04-03 MED ORDER — METOPROLOL TARTRATE 50 MG PO TABS
75.0000 mg | ORAL_TABLET | Freq: Two times a day (BID) | ORAL | Status: DC
  Administered 2020-04-03 – 2020-04-04 (×2): 75 mg via ORAL
  Filled 2020-04-03 (×2): qty 1

## 2020-04-03 MED ORDER — ADULT MULTIVITAMIN W/MINERALS CH
1.0000 | ORAL_TABLET | Freq: Every day | ORAL | Status: DC
  Administered 2020-04-03 – 2020-04-04 (×2): 1 via ORAL
  Filled 2020-04-03 (×2): qty 1

## 2020-04-03 NOTE — ED Notes (Signed)
Lunch Tray Ordered @ 1006.  

## 2020-04-03 NOTE — ED Notes (Signed)
Patient up walking to bathroom, gait steady

## 2020-04-03 NOTE — Plan of Care (Signed)

## 2020-04-03 NOTE — Progress Notes (Signed)
  Echocardiogram 2D Echocardiogram has been performed.  [name] 04/03/2020, 3:14 PM

## 2020-04-03 NOTE — Evaluation (Signed)
Physical Therapy Evaluation and Discharge Patient Details Name: [name] MRN: [id number] DOB: 1961-01-09 Today's Date: 04/03/2020   History of Present Illness  Pt is a 59 y/o female admitted secondary to , tremors, intermittent headaches, and visual hallucinations. Thought to be from alcohol withdrawal. Pt also with a fib. PMH includes HTN, alcohol abuse and a fib.  Clinical Impression  Pt admitted secondary to problem above with deficits below. Pt reporting some weakness in BLE, but did not seem to limit ambulation. One instance of dizziness when turning, requiring min guard A for safety, but otherwise requiring supervision for mobility tasks. Anticipate pt will progress well and will not require follow up PT services. Educated about walking program to perform at home. No further skilled acute PT needs at this time. Will sign off. If needs change, please re-consult.     Follow Up Recommendations No PT follow up    Equipment Recommendations  3in1 (PT)    Recommendations for Other Services       Precautions / Restrictions Precautions Precautions: None Restrictions Weight Bearing Restrictions: No      Mobility  Bed Mobility               General bed mobility comments: Sitting EOB upon entry    Transfers Overall transfer level: Needs assistance Equipment used: None Transfers: Sit to/from Stand Sit to Stand: Supervision         General transfer comment: Supervision for safety.  Ambulation/Gait Ambulation/Gait assistance: Supervision;Min guard Gait Distance (Feet): 100 Feet Assistive device: None Gait Pattern/deviations: Step-through pattern;Decreased stride length;Wide base of support Gait velocity: Decreased   General Gait Details: Waddle type gait. Pt reports LEs felt somewhat weak, but overall did not limit ambulation. Pt with one episode of dizziness when turning, requiring min guard for safety. otherwise required supervision. Educated about walking  program to perform at home.  Stairs            Wheelchair Mobility    Modified Rankin (Stroke Patients Only)       Balance Overall balance assessment: Mild deficits observed, not formally tested                                           Pertinent Vitals/Pain Pain Assessment: No/denies pain    Home Living Family/patient expects to be discharged to:: Private residence Living Arrangements: Spouse/significant other Available Help at Discharge: Family;Available PRN/intermittently Type of Home: House Home Access: Stairs to enter Entrance Stairs-Rails: Can reach both Entrance Stairs-Number of Steps: 4 Home Layout: One level Home Equipment: Walker - 2 wheels;Cane - single point      Prior Function Level of Independence: Independent               Hand Dominance   Dominant Hand: Right    Extremity/Trunk Assessment   Upper Extremity Assessment Upper Extremity Assessment: Overall WFL for tasks assessed    Lower Extremity Assessment Lower Extremity Assessment: Generalized weakness    Cervical / Trunk Assessment Cervical / Trunk Assessment: Normal  Communication   Communication: No difficulties  Cognition Arousal/Alertness: Awake/alert Behavior During Therapy: WFL for tasks assessed/performed Overall Cognitive Status: Within Functional Limits for tasks assessed  General Comments      Exercises     Assessment/Plan    PT Assessment Patent does not need any further PT services  PT Problem List         PT Treatment Interventions      PT Goals (Current goals can be found in the Care Plan section)  Acute Rehab PT Goals Patient Stated Goal: to go home PT Goal Formulation: With patient Time For Goal Achievement: 04/03/20 Potential to Achieve Goals: Good    Frequency     Barriers to discharge        Co-evaluation               AM-PAC PT "6 Clicks" Mobility   Outcome Measure Help needed turning from your back to your side while in a flat bed without using bedrails?: None Help needed moving from lying on your back to sitting on the side of a flat bed without using bedrails?: None Help needed moving to and from a bed to a chair (including a wheelchair)?: None Help needed standing up from a chair using your arms (e.g., wheelchair or bedside chair)?: None Help needed to walk in hospital room?: None Help needed climbing 3-5 steps with a railing? : A Little 6 Click Score: 23    End of Session   Activity Tolerance: Patient tolerated treatment well Patient left: in chair;with call bell/phone within reach (in chair in ED) Nurse Communication: Mobility status PT Visit Diagnosis: Muscle weakness (generalized) (M62.81)    Time: 1594-7076 PT Time Calculation (min) (ACUTE ONLY): 16 min   Charges:   PT Evaluation $PT Eval Low Complexity: 1 Low          Lou Miner, DPT  Acute Rehabilitation Services  Pager: [phone] Office: [phone]   Rudean Hitt 04/03/2020, 1:43 PM

## 2020-04-03 NOTE — Plan of Care (Signed)

## 2020-04-03 NOTE — Progress Notes (Signed)
PROGRESS NOTE    [name]  OHY:[id number] DOB: [birth year] DOA: 04/01/2020 PCP: Biagio Borg, MD   Brief Narrative:   [name] is an 60 y.o. female with a PMH ofalcohol abuse, asthma, obesity, HTN, HLD, h/o palpitations who presented to the ED witha chief complaint of overall weakness, tremors, intermittent headaches, and visual hallucinations. She reported that she has been drinking a pint to 1/5 of liquor daily for the past 2 weeks. In the ED, noted to be in afib with RVR.  CIWA initiated.   Assessment & Plan:  Alcohol abuse/dependence with withdrawal: -Patient has history of heavy ethanol use, ethanol withdrawal seizures -On CIWA protocol. -Continue Ativan detox. -Continue folic acid, thiamine, multivitamin supplementation -TOC consulted for substance abuse, counseling/resources. -Consult PT  A. fib with RVR: -Could not tolerate Cardizem.  Currently on metoprolol.  TSH: WNL -Rate is still runs in 100s.  Will increase dose of metoprolol from 50 mg to 75 mg twice daily -Get transthoracic echo -Continue Eliquis.  Hypertension: Blood pressure is stable -Continue metoprolol and clonidine  Elevated liver enzymes: -AST>ALT-secondary to alcohol abuse -Right upper quadrant ultrasound done on 09/26/2019 shows hepatic steatosis.  No acute process -We will check hepatitis panel Hypokalemia: Replenished.  Repeat BMP tomorrow a.m.  Mild AKI: Kidney function improving. -Monitor closely.  Continue with gentle hydration  History of PE: Continue Eliquis  Hyperlipidemia: Continue statin  Chronic asthma: -Not in acute exacerbation.  Maintaining oxygen saturation on room air.  No wheezing noted on exam. -Continue albuterol as needed and Dulera  GERD: Continue Pepcid  Morbid obesity with BMI of 43: -Diet modification/exercise and weight loss recommended.   DVT prophylaxis: Eliquis Code Status: Full code Family Communication:  None present at bedside.  Plan of care  discussed with patient in length and she verbalized understanding and agreed with it. Disposition Plan: Home in 1 to 2 days  Consultants:  None Procedures:   None  Antimicrobials:   None  Status is: Inpatient   Dispo: The patient is from: Home              Anticipated d/c is to: Home              Anticipated d/c date is: 2 days              Patient currently is not medically stable to d/c.   Difficult to place patient No   Subjective: Patient seen and examined in ED.  Resting comfortably on the bed.  Tells me that she feels depressed this morning however denies any active suicidal or homicidal thoughts.  Denies chest pain, shortness of breath, palpitation, fever, chills, agitation or anxiety.  No history of smoking, illicit drug use however drinks alcohol on the daily basis.  Objective: Vitals:   04/03/20 0511 04/03/20 0700 04/03/20 0848 04/03/20 0958  BP: (!) 142/82 (!) 133/91 (!) 121/91 118/85  Pulse: 99 98 72 100  Resp: (!) 24 (!) 21  (!) 21  Temp: 98.3 F (36.8 C)     TempSrc: Oral     SpO2: 97% 99%  100%  Weight:      Height:       No intake or output data in the 24 hours ending 04/03/20 1111 Filed Weights   04/01/20 0255  Weight: 129.3 kg    Examination:  General exam: Appears calm and comfortable, obese, on room air, communicating well Respiratory system: Clear to auscultation. Respiratory effort normal. Cardiovascular system: S1 & S2  heard, RRR. No JVD, murmurs, rubs, gallops or clicks. No pedal edema. Gastrointestinal system: Abdomen is nondistended, soft and nontender. No organomegaly or masses felt. Normal bowel sounds heard. Central nervous system: Alert and oriented. No focal neurological deficits. Extremities: Symmetric 5 x 5 power. Skin: No rashes, lesions or ulcers Psychiatry: Judgement and insight appear normal. Mood & affect appropriate.    Data Reviewed: I have personally reviewed following labs and imaging studies  CBC: Recent Labs  Lab  04/01/20 0310  WBC 9.9  HGB 15.2*  HCT 44.8  MCV 90.1  PLT 371   Basic Metabolic Panel: Recent Labs  Lab 04/01/20 0310 04/02/20 0452 04/03/20 0637  NA 138 133* 136  K 3.8 3.8 3.2*  CL 98 99 98  CO2 23 27 24   GLUCOSE 79 179* 153*  BUN 14 24* 21*  CREATININE 0.84 1.32* 1.06*  CALCIUM 9.0 8.9 9.2  MG  --   --  1.9   GFR: Estimated Creatinine Clearance: 72.5 mL/min (A) (by C-G formula based on SCr of 1.06 mg/dL (H)). Liver Function Tests: Recent Labs  Lab 04/01/20 1430 04/03/20 0637  AST 95* 103*  ALT 89* 84*  ALKPHOS 102 96  BILITOT 1.8* 1.0  PROT 7.8 7.3  ALBUMIN 3.7 3.4*   No results for input(s): LIPASE, AMYLASE in the last 168 hours. No results for input(s): AMMONIA in the last 168 hours. Coagulation Profile: No results for input(s): INR, PROTIME in the last 168 hours. Cardiac Enzymes: No results for input(s): CKTOTAL, CKMB, CKMBINDEX, TROPONINI in the last 168 hours. BNP (last 3 results) No results for input(s): PROBNP in the last 8760 hours. HbA1C: No results for input(s): HGBA1C in the last 72 hours. CBG: No results for input(s): GLUCAP in the last 168 hours. Lipid Profile: No results for input(s): CHOL, HDL, LDLCALC, TRIG, CHOLHDL, LDLDIRECT in the last 72 hours. Thyroid Function Tests: Recent Labs    04/01/20 2010  TSH 1.904   Anemia Panel: No results for input(s): VITAMINB12, FOLATE, FERRITIN, TIBC, IRON, RETICCTPCT in the last 72 hours. Sepsis Labs: No results for input(s): PROCALCITON, LATICACIDVEN in the last 168 hours.  Recent Results (from the past 240 hour(s))  SARS CORONAVIRUS 2 (TAT 6-24 HRS) Nasopharyngeal Nasopharyngeal Swab     Status: None   Collection Time: 04/01/20  2:30 PM   Specimen: Nasopharyngeal Swab  Result Value Ref Range Status   SARS Coronavirus 2 NEGATIVE NEGATIVE Final    Comment: (NOTE) SARS-CoV-2 target nucleic acids are NOT DETECTED.  The SARS-CoV-2 RNA is generally detectable in upper and lower respiratory  specimens during the acute phase of infection. Negative results do not preclude SARS-CoV-2 infection, do not rule out co-infections with other pathogens, and should not be used as the sole basis for treatment or other patient management decisions. Negative results must be combined with clinical observations, patient history, and epidemiological information. The expected result is Negative.  Fact Sheet for Patients: SugarRoll.be  Fact Sheet for Healthcare Providers: https://www.woods-mathews.com/  This test is not yet approved or cleared by the [place] FDA and  has been authorized for detection and/or diagnosis of SARS-CoV-2 by FDA under an Emergency Use Authorization (EUA). This EUA will remain  in effect (meaning this test can be used) for the duration of the COVID-19 declaration under Se ction 564(b)(1) of the Act, 21 U.S.C. section 360bbb-3(b)(1), unless the authorization is terminated or revoked sooner.  Performed at [facility], [place] [address], [place],  [address]  Radiology Studies: No results found.  Scheduled Meds: . apixaban  5 mg Oral BID  . atorvastatin  20 mg Oral Daily  . cloNIDine  0.1 mg Oral Daily  . famotidine  20 mg Oral Daily  . folic acid  1 mg Oral Daily  . LORazepam  0-4 mg Intravenous Q6H   Or  . LORazepam  0-4 mg Oral Q6H  . LORazepam  0-4 mg Intravenous Q12H   Or  . LORazepam  0-4 mg Oral Q12H  . metoprolol tartrate  50 mg Oral BID  . mometasone-formoterol  2 puff Inhalation BID  . multivitamin with minerals  1 tablet Oral Daily  . potassium chloride SA  10 mEq Oral QODAY  . thiamine  100 mg Oral Daily   Or  . thiamine  100 mg Intravenous Daily   Continuous Infusions: . sodium chloride 50 mL/hr at 04/01/20 2159     LOS: 2 days   Time spent: 35 minutes   Babbie Dondlinger Loann Quill, MD [facility]  If 7PM-7AM, please contact night-coverage [email or web address] 04/03/2020, 11:11  AM

## 2020-04-04 ENCOUNTER — Other Ambulatory Visit: Payer: Self-pay | Admitting: Internal Medicine

## 2020-04-04 LAB — COMPREHENSIVE METABOLIC PANEL
ALT: 95 U/L — ABNORMAL HIGH (ref 0–44)
AST: 117 U/L — ABNORMAL HIGH (ref 15–41)
Albumin: 2.9 g/dL — ABNORMAL LOW (ref 3.5–5.0)
Alkaline Phosphatase: 89 U/L (ref 38–126)
Anion gap: 10 (ref 5–15)
BUN: 17 mg/dL (ref 6–20)
CO2: 27 mmol/L (ref 22–32)
Calcium: 8.9 mg/dL (ref 8.9–10.3)
Chloride: 99 mmol/L (ref 98–111)
Creatinine, Ser: 0.87 mg/dL (ref 0.44–1.00)
GFR, Estimated: >60 mL/min (ref >60)
Glucose, Bld: 140 mg/dL — ABNORMAL HIGH (ref 70–99)
Potassium: 3.4 mmol/L — ABNORMAL LOW (ref 3.5–5.1)
Sodium: 136 mmol/L (ref 135–145)
Total Bilirubin: 1.1 mg/dL (ref 0.3–1.2)
Total Protein: 6.2 g/dL — ABNORMAL LOW (ref 6.5–8.1)

## 2020-04-04 MED ORDER — POTASSIUM CHLORIDE CRYS ER 20 MEQ PO TBCR
40.0000 meq | EXTENDED_RELEASE_TABLET | Freq: Once | ORAL | Status: AC
  Administered 2020-04-04: 40 meq via ORAL
  Filled 2020-04-04: qty 2

## 2020-04-04 MED ORDER — GUAIFENESIN-DM 100-10 MG/5ML PO SYRP: 5.0000 mL | ORAL_SOLUTION | ORAL | 0 refills | Status: DC | PRN

## 2020-04-04 MED ORDER — THIAMINE HCL 100 MG PO TABS: 100.0000 mg | ORAL_TABLET | Freq: Every day | ORAL | 0 refills | Status: DC

## 2020-04-04 MED ORDER — FOLIC ACID 1 MG PO TABS: 1.0000 mg | ORAL_TABLET | Freq: Every day | ORAL | 1 refills | Status: DC

## 2020-04-04 MED ORDER — METOPROLOL TARTRATE 75 MG PO TABS: 75.0000 mg | ORAL_TABLET | Freq: Two times a day (BID) | ORAL | 3 refills | Status: DC

## 2020-04-04 MED FILL — METOPROLOL TARTRATE 50 MG T: 50 | 30 days supply | Qty: 90 | Fill #0

## 2020-04-04 MED FILL — VITAMIN B-1 100 MG TABS: 100 | 30 days supply | Qty: 30 | Fill #0

## 2020-04-04 MED FILL — COUGH/CHEST CONGESTION DM 1: 10-100 | 3 days supply | Qty: 118 | Fill #0

## 2020-04-04 MED FILL — FOLIC ACID 1 MG TABS: 1 | 30 days supply | Qty: 30 | Fill #0

## 2020-04-04 NOTE — Plan of Care (Signed)

## 2020-04-04 NOTE — Discharge Summary (Signed)
Physician Discharge Summary  [name] KYH:[id number] DOB: Sep 18, 1960 DOA: 04/01/2020  PCP: Biagio Borg, MD  Admit date: 04/01/2020 Discharge date: 04/04/2020  Admitted From: Home  Disposition:  Home   Recommendations for Outpatient Follow-up:  1. Follow up with PCP in 1-2 weeks 2. Please obtain BMP/CBC in one week 3. Needs repeat LFT/ if normalized resume lipitor.   Home Health: None  Discharge Condition: Stable.  CODE STATUS: Full code Diet recommendation: Heart Healthy   Brief/Interim Summary: MISAO FACKRELL an 60 y.o.femalewitha PMHofalcohol abuse, asthma, obesity, HTN, HLD, h/o palpitations who presented to the [facility] chief complaint ofoverall weakness, tremors, intermittent headaches, and visual hallucinations. Shereported thatshe has been drinking a pint to 1/5 of liquor daily for the past 2 weeks. In the ED, noted to be in afib with RVR. CIWA initiated.   Alcohol abuse/dependence with withdrawal: -Patient has history of heavy ethanol use, ethanol withdrawal seizures -On CIWA protocol. -Continue Ativan detox. -Continue folic acid, thiamine, multivitamin supplementation -TOC consulted for substance abuse, counseling/resources. -Consult PT. No HH PT needed.   A. fib with RVR: -Could not tolerate Cardizem.  Currently on metoprolol.  TSH: WNL -Discharge on  metoprolol 75 mg twice daily -ECHO normal EF.  -Continue Eliquis. -stable.   Hypertension: Blood pressure is stable -Continue metoprolol and resume HCTZ/cozaar.   Elevated liver enzymes: -AST>ALT-secondary to alcohol abuse -Right upper quadrant ultrasound done on 09/26/2019 shows hepatic steatosis.  No acute process -hepatitis panel negative.  -mild elevation LFT. Hold Lipitor.  Needs repeat LFT in 1 week.   Hypokalemia: Replenished.   Mild AKI: Kidney function improving. -Monitor closely.  Continue with gentle hydration  History of PE: Continue Eliquis  Hyperlipidemia: Continue  statin  Chronic asthma: -Not in acute exacerbation.  Maintaining oxygen saturation on room air.  No wheezing noted on exam. -Continue albuterol as needed and Dulera  GERD: Continue Pepcid  Morbid obesity with BMI of 43: -Diet modification/exercise and weight loss recommended.     Discharge Diagnoses:  Principal Problem:   Alcohol dependence with withdrawal ([place]) Active Problems:   Essential hypertension   Extrinsic asthma   GERD   Obesity, Class III, BMI 40-49.9 (morbid obesity) ([place])   Alcohol abuse   Atrial fibrillation with RVR (HCC)   Atrial flutter with rapid ventricular response [facility])    Discharge Instructions  Discharge Instructions    Amb referral to AFIB Clinic   Complete by: As directed    Diet - low sodium heart healthy   Complete by: As directed    Increase activity slowly   Complete by: As directed      Allergies as of 04/04/2020      Reactions   Morphine Itching   Shrimp [shellfish Allergy] Itching, Other (See Comments)   Tongue burns also   Tramadol Other (See Comments)   Caused confusion   Diltiazem Hcl Itching   Pt with itching of the feet when bolus given   Other Other (See Comments)   Latex Rash      Medication List    STOP taking these medications   atorvastatin 20 MG tablet Commonly known as: Lipitor   cloNIDine 0.1 MG tablet Commonly known as: CATAPRES   LORazepam 1 MG tablet Commonly known as: ATIVAN   losartan 100 MG tablet Commonly known as: COZAAR   multivitamin with minerals Tabs tablet   triamcinolone 0.1 % Commonly known as: KENALOG     TAKE these medications   Advair HFA 115-21 MCG/ACT inhaler Generic  drug: fluticasone-salmeterol Inhale 1 puff into the lungs 2 (two) times daily.   albuterol 108 (90 Base) MCG/ACT inhaler Commonly known as: VENTOLIN HFA INHALE 2 PUFFS INTO THE LUNGS EVERY 6 HOURS AS NEEDED FOR SHORTNESS OF BREATH   chlorpheniramine-HYDROcodone 10-8 MG/5ML Suer Commonly known as:  Tussionex Pennkinetic ER Take 5 mLs by mouth every 12 (twelve) hours as needed for cough.   cimetidine 200 MG tablet Commonly known as: TAGAMET Take 200 mg by mouth every other day.   Eliquis 5 MG Tabs tablet Generic drug: apixaban TAKE 1 TABLET BY MOUTH TWICE A DAY   folic acid 1 MG tablet Commonly known as: FOLVITE Take 1 tablet (1 mg total) by mouth daily. Start taking on: April 05, 2020   Klor-Con M20 20 MEQ tablet Generic drug: potassium chloride SA TAKE 1 TABLET BY MOUTH EVERY DAY What changed:   how much to take  when to take this   losartan-hydrochlorothiazide 100-25 MG tablet Commonly known as: HYZAAR Take 1 tablet by mouth daily.   Metoprolol Tartrate 75 MG Tabs Take 75 mg by mouth 2 (two) times daily.   ondansetron 8 MG disintegrating tablet Commonly known as: ZOFRAN-ODT Take 1 tablet (8 mg total) by mouth every 8 (eight) hours as needed for nausea or vomiting.   thiamine 100 MG tablet Take 1 tablet (100 mg total) by mouth daily. Start taking on: April 05, 2020       Allergies  Allergen Reactions  . Morphine Itching  . Shrimp [Shellfish Allergy] Itching and Other (See Comments)    Tongue burns also  . Tramadol Other (See Comments)    Caused confusion  . Diltiazem Hcl Itching    Pt with itching of the feet when bolus given  . Other Other (See Comments)  . Latex Rash    Consultations: None  Procedures/Studies: ECHOCARDIOGRAM COMPLETE  Result Date: 04/03/2020    ECHOCARDIOGRAM REPORT   Patient Name:   [name] Date of Exam: 04/03/2020 Medical Rec #:  [id number]       Height:       61.0 in Accession #:    [id number]      Weight:       285.0 lb Date of Birth:  10-Oct-1960       BSA:          2.196 m Patient Age:    60 years        BP:           98/66 mmHg Patient Gender: F               HR:           87 bpm. Exam Location:  Inpatient Procedure: 2D Echo, Cardiac Doppler and Color Doppler Indications:    I48.0 Paroxysmal atrial fibrillation   History:        Patient has prior history of Echocardiogram examinations, most                 recent 07/06/2018. Arrythmias:Atrial Fibrillation and Atrial                 Flutter, Signs/Symptoms:Murmur; Risk Factors:Hypertension,                 Dyslipidemia and GERD. Alcohol abuse.  Sonographer:    Jonelle Sidle Dance Referring Phys: [id number] [place]  1. Left ventricular ejection fraction, by estimation, is 50 to 55%. The left ventricle has low normal function. The left ventricle has no  regional wall motion abnormalities. There is mild concentric left ventricular hypertrophy. Left ventricular diastolic parameters are indeterminate.  2. Right ventricular systolic function is normal. The right ventricular size is normal. Tricuspid regurgitation signal is inadequate for assessing PA pressure.  3. Left atrial size was mildly dilated.  4. Right atrial size was mildly dilated.  5. A small pericardial effusion is present. The pericardial effusion is circumferential.  6. The mitral valve is normal in structure. No evidence of mitral valve regurgitation. No evidence of mitral stenosis.  7. The aortic valve is grossly normal. Aortic valve regurgitation is not visualized. No aortic stenosis is present.  8. The inferior vena cava is normal in size with <50% respiratory variability, suggesting right atrial pressure of 8 mmHg. Comparison(s): Changes from prior study are noted. EF now low normal. Conclusion(s)/Recommendation(s): Otherwise normal echocardiogram, with minor abnormalities described in the report. FINDINGS  Left Ventricle: Left ventricular ejection fraction, by estimation, is 50 to 55%. The left ventricle has low normal function. The left ventricle has no regional wall motion abnormalities. The left ventricular internal cavity size was normal in size. There is mild concentric left ventricular hypertrophy. Left ventricular diastolic parameters are indeterminate. Right Ventricle: The right ventricular size  is normal. Right vetricular wall thickness was not well visualized. Right ventricular systolic function is normal. Tricuspid regurgitation signal is inadequate for assessing PA pressure. Left Atrium: Left atrial size was mildly dilated. Right Atrium: Right atrial size was mildly dilated. Pericardium: A small pericardial effusion is present. The pericardial effusion is circumferential. Mitral Valve: The mitral valve is normal in structure. No evidence of mitral valve regurgitation. No evidence of mitral valve stenosis. Tricuspid Valve: The tricuspid valve is normal in structure. Tricuspid valve regurgitation is trivial. No evidence of tricuspid stenosis. Aortic Valve: The aortic valve is grossly normal. There is mild aortic valve annular calcification. Aortic valve regurgitation is not visualized. No aortic stenosis is present. Pulmonic Valve: The pulmonic valve was grossly normal. Pulmonic valve regurgitation is not visualized. No evidence of pulmonic stenosis. Aorta: The aortic root, ascending aorta and aortic arch are all structurally normal, with no evidence of dilitation or obstruction. Venous: The inferior vena cava is normal in size with less than 50% respiratory variability, suggesting right atrial pressure of 8 mmHg. IAS/Shunts: The atrial septum is grossly normal.  LEFT VENTRICLE PLAX 2D LVIDd:         5.30 cm  Diastology LVIDs:         4.20 cm  LV e' medial:    4.57 cm/s LV PW:         1.30 cm  LV E/e' medial:  10.1 LV IVS:        0.90 cm  LV e' lateral:   5.66 cm/s LVOT diam:     1.50 cm  LV E/e' lateral: 8.2 LV SV:         36 LV SV Index:   16 LVOT Area:     1.77 cm  RIGHT VENTRICLE             IVC RV Basal diam:  3.00 cm     IVC diam: 1.50 cm RV S prime:     12.10 cm/s TAPSE (M-mode): 1.8 cm LEFT ATRIUM             Index       RIGHT ATRIUM           Index LA diam:        4.70 cm 2.14  cm/m  RA Area:     19.30 cm LA Vol (A2C):   56.6 ml 25.78 ml/m RA Volume:   53.40 ml  24.32 ml/m LA Vol (A4C):    66.7 ml 30.38 ml/m LA Biplane Vol: 61.4 ml 27.97 ml/m  AORTIC VALVE LVOT Vmax:   102.00 cm/s LVOT Vmean:  68.600 cm/s LVOT VTI:    0.202 m  AORTA Ao Root diam: 3.30 cm Ao Asc diam:  3.70 cm MITRAL VALVE MV Area (PHT): 2.42 cm    SHUNTS MV Decel Time: 313 msec    Systemic VTI:  0.20 m MV E velocity: 46.30 cm/s  Systemic Diam: 1.50 cm MV A velocity: 80.60 cm/s MV E/A ratio:  0.57 Buford Dresser MD Electronically signed by Buford Dresser MD Signature Date/Time: 04/03/2020/6:04:20 PM    Final       Subjective: Denies chest pain or dyspnea.   Discharge Exam: Vitals:   04/04/20 0502 04/04/20 0739  BP: 106/72 134/86  Pulse: 78 87  Resp: 17 17  Temp: 98.3 F (36.8 C) 98 F (36.7 C)  SpO2: 96% 93%     General: Pt is alert, awake, not in acute distress Cardiovascular: RRR, S1/S2 +, no rubs, no gallops Respiratory: CTA bilaterally, no wheezing, no rhonchi Abdominal: Soft, NT, ND, bowel sounds + Extremities: no edema, no cyanosis    The results of significant diagnostics from this hospitalization (including imaging, microbiology, ancillary and laboratory) are listed below for reference.     Microbiology: Recent Results (from the past 240 hour(s))  SARS CORONAVIRUS 2 (TAT 6-24 HRS) Nasopharyngeal Nasopharyngeal Swab     Status: None   Collection Time: 04/01/20  2:30 PM   Specimen: Nasopharyngeal Swab  Result Value Ref Range Status   SARS Coronavirus 2 NEGATIVE NEGATIVE Final    Comment: (NOTE) SARS-CoV-2 target nucleic acids are NOT DETECTED.  The SARS-CoV-2 RNA is generally detectable in upper and lower respiratory specimens during the acute phase of infection. Negative results do not preclude SARS-CoV-2 infection, do not rule out co-infections with other pathogens, and should not be used as the sole basis for treatment or other patient management decisions. Negative results must be combined with clinical observations, patient history, and epidemiological  information. The expected result is Negative.  Fact Sheet for Patients: SugarRoll.be  Fact Sheet for Healthcare Providers: https://www.woods-mathews.com/  This test is not yet approved or cleared by the [place] FDA and  has been authorized for detection and/or diagnosis of SARS-CoV-2 by FDA under an Emergency Use Authorization (EUA). This EUA will remain  in effect (meaning this test can be used) for the duration of the COVID-19 declaration under Se ction 564(b)(1) of the Act, 21 U.S.C. section 360bbb-3(b)(1), unless the authorization is terminated or revoked sooner.  Performed at [facility], [place] [address] [place], [place] [address]      Labs: BNP (last 3 results) Recent Labs    05/23/19 0600  BNP 73.2   Basic Metabolic Panel: Recent Labs  Lab 04/01/20 0310 04/02/20 0452 04/03/20 0637 04/04/20 0243  NA 138 133* 136 136  K 3.8 3.8 3.2* 3.4*  CL 98 99 98 99  CO2 23 27 24 27   GLUCOSE 79 179* 153* 140*  BUN 14 24* 21* 17  CREATININE 0.84 1.32* 1.06* 0.87  CALCIUM 9.0 8.9 9.2 8.9  MG  --   --  1.9  --    Liver Function Tests: Recent Labs  Lab 04/01/20 1430 04/03/20 0637 04/04/20 0243  AST  95* 103* 117*  ALT 89* 84* 95*  ALKPHOS 102 96 89  BILITOT 1.8* 1.0 1.1  PROT 7.8 7.3 6.2*  ALBUMIN 3.7 3.4* 2.9*   No results for input(s): LIPASE, AMYLASE in the last 168 hours. No results for input(s): AMMONIA in the last 168 hours. CBC: Recent Labs  Lab 04/01/20 0310  WBC 9.9  HGB 15.2*  HCT 44.8  MCV 90.1  PLT 282   Cardiac Enzymes: No results for input(s): CKTOTAL, CKMB, CKMBINDEX, TROPONINI in the last 168 hours. BNP: Invalid input(s): POCBNP CBG: No results for input(s): GLUCAP in the last 168 hours. D-Dimer No results for input(s): DDIMER in the last 72 hours. Hgb A1c No results for input(s): HGBA1C in the last 72 hours. Lipid Profile No results for input(s): CHOL, HDL, LDLCALC, TRIG,  CHOLHDL, LDLDIRECT in the last 72 hours. Thyroid function studies Recent Labs    04/01/20 2010  TSH 1.904   Anemia work up No results for input(s): VITAMINB12, FOLATE, FERRITIN, TIBC, IRON, RETICCTPCT in the last 72 hours. Urinalysis    Component Value Date/Time   COLORURINE AMBER (A) 04/01/2020 0300   APPEARANCEUR TURBID (A) 04/01/2020 0300   LABSPEC 1.034 (H) 04/01/2020 0300   PHURINE 5.0 04/01/2020 0300   GLUCOSEU NEGATIVE 04/01/2020 0300   GLUCOSEU NEGATIVE 03/01/2020 1641   HGBUR MODERATE (A) 04/01/2020 0300   BILIRUBINUR NEGATIVE 04/01/2020 0300   BILIRUBINUR negative 09/16/2011 1047   KETONESUR 20 (A) 04/01/2020 0300   PROTEINUR 100 (A) 04/01/2020 0300   UROBILINOGEN 0.2 03/01/2020 1641   NITRITE NEGATIVE 04/01/2020 0300   LEUKOCYTESUR NEGATIVE 04/01/2020 0300   Sepsis Labs Invalid input(s): PROCALCITONIN,  WBC,  LACTICIDVEN Microbiology Recent Results (from the past 240 hour(s))  SARS CORONAVIRUS 2 (TAT 6-24 HRS) Nasopharyngeal Nasopharyngeal Swab     Status: None   Collection Time: 04/01/20  2:30 PM   Specimen: Nasopharyngeal Swab  Result Value Ref Range Status   SARS Coronavirus 2 NEGATIVE NEGATIVE Final    Comment: (NOTE) SARS-CoV-2 target nucleic acids are NOT DETECTED.  The SARS-CoV-2 RNA is generally detectable in upper and lower respiratory specimens during the acute phase of infection. Negative results do not preclude SARS-CoV-2 infection, do not rule out co-infections with other pathogens, and should not be used as the sole basis for treatment or other patient management decisions. Negative results must be combined with clinical observations, patient history, and epidemiological information. The expected result is Negative.  Fact Sheet for Patients: SugarRoll.be  Fact Sheet for Healthcare Providers: https://www.woods-mathews.com/  This test is not yet approved or cleared by the [place] FDA and  has  been authorized for detection and/or diagnosis of SARS-CoV-2 by FDA under an Emergency Use Authorization (EUA). This EUA will remain  in effect (meaning this test can be used) for the duration of the COVID-19 declaration under Se ction 564(b)(1) of the Act, 21 U.S.C. section 360bbb-3(b)(1), unless the authorization is terminated or revoked sooner.  Performed at [facility], [place] [address] [place], [place] [address]      Time coordinating discharge: 40 minutes  SIGNED:   Elmarie Shiley, MD  [facility]

## 2020-04-13 ENCOUNTER — Telehealth: Payer: Self-pay | Admitting: Internal Medicine

## 2020-04-13 ENCOUNTER — Other Ambulatory Visit: Payer: Self-pay | Admitting: Internal Medicine

## 2020-04-13 DIAGNOSIS — Z1231 Encounter for screening mammogram for malignant neoplasm of breast: Secondary | ICD-10-CM

## 2020-04-13 NOTE — Telephone Encounter (Signed)
No sorry, this is a controlled substance and cannot be taken long term

## 2020-04-13 NOTE — Telephone Encounter (Signed)
    Patient calling to request order for Hydrocod Polst-Chlorphen Polst 10-8 MG/5ML   She sates she recently had negative covid test However she has a cough  Pharmacy [id number] - [place], [place]

## 2020-04-16 ENCOUNTER — Other Ambulatory Visit: Payer: Self-pay | Admitting: Internal Medicine

## 2020-04-16 NOTE — Telephone Encounter (Signed)
Patient notified that cough syrup request has been denied via voicemail.

## 2020-04-20 ENCOUNTER — Other Ambulatory Visit: Payer: Self-pay

## 2020-04-20 ENCOUNTER — Encounter (HOSPITAL_COMMUNITY): Payer: Self-pay

## 2020-04-20 ENCOUNTER — Ambulatory Visit (HOSPITAL_COMMUNITY)
Admission: RE | Admit: 2020-04-20 | Discharge: 2020-04-20 | Disposition: A | Discharge status: Home / Self Care | Payer: BC Managed Care – PPO | Source: Ambulatory Visit | Attending: Nurse Practitioner | Admitting: Nurse Practitioner

## 2020-04-20 ENCOUNTER — Other Ambulatory Visit: Payer: Self-pay | Admitting: Internal Medicine

## 2020-04-20 ENCOUNTER — Encounter (HOSPITAL_COMMUNITY): Payer: Self-pay | Admitting: Nurse Practitioner

## 2020-04-20 VITALS — BP 144/82 | HR 65 | Ht 61.0 in | Wt 287.8 lb

## 2020-04-20 DIAGNOSIS — Z87891 Personal history of nicotine dependence: Secondary | ICD-10-CM | POA: Diagnosis not present

## 2020-04-20 DIAGNOSIS — D6869 Other thrombophilia: Secondary | ICD-10-CM

## 2020-04-20 DIAGNOSIS — I4891 Unspecified atrial fibrillation: Secondary | ICD-10-CM | POA: Diagnosis not present

## 2020-04-20 DIAGNOSIS — Z96652 Presence of left artificial knee joint: Secondary | ICD-10-CM | POA: Diagnosis not present

## 2020-04-20 DIAGNOSIS — Z6841 Body Mass Index (BMI) 40.0 and over, adult: Secondary | ICD-10-CM | POA: Diagnosis not present

## 2020-04-20 DIAGNOSIS — Z823 Family history of stroke: Secondary | ICD-10-CM | POA: Diagnosis not present

## 2020-04-20 DIAGNOSIS — I10 Essential (primary) hypertension: Secondary | ICD-10-CM | POA: Insufficient documentation

## 2020-04-20 DIAGNOSIS — Z7901 Long term (current) use of anticoagulants: Secondary | ICD-10-CM | POA: Insufficient documentation

## 2020-04-20 DIAGNOSIS — Z79899 Other long term (current) drug therapy: Secondary | ICD-10-CM | POA: Diagnosis not present

## 2020-04-20 DIAGNOSIS — J45909 Unspecified asthma, uncomplicated: Secondary | ICD-10-CM | POA: Diagnosis not present

## 2020-04-20 DIAGNOSIS — E785 Hyperlipidemia, unspecified: Secondary | ICD-10-CM | POA: Insufficient documentation

## 2020-04-20 DIAGNOSIS — R748 Abnormal levels of other serum enzymes: Secondary | ICD-10-CM | POA: Insufficient documentation

## 2020-04-20 LAB — COMPREHENSIVE METABOLIC PANEL
ALT: 36 U/L (ref 0–44)
AST: 32 U/L (ref 15–41)
Albumin: 3.1 g/dL — ABNORMAL LOW (ref 3.5–5.0)
Alkaline Phosphatase: 92 U/L (ref 38–126)
Anion gap: 7 (ref 5–15)
BUN: 13 mg/dL (ref 6–20)
CO2: 29 mmol/L (ref 22–32)
Calcium: 9.4 mg/dL (ref 8.9–10.3)
Chloride: 103 mmol/L (ref 98–111)
Creatinine, Ser: 1.03 mg/dL — ABNORMAL HIGH (ref 0.44–1.00)
GFR, Estimated: >60 mL/min (ref >60)
Glucose, Bld: 104 mg/dL — ABNORMAL HIGH (ref 70–99)
Potassium: 3.6 mmol/L (ref 3.5–5.1)
Sodium: 139 mmol/L (ref 135–145)
Total Bilirubin: 0.6 mg/dL (ref 0.3–1.2)
Total Protein: 7.1 g/dL (ref 6.5–8.1)

## 2020-04-20 LAB — CBC
HCT: 45.8 % (ref 36.0–46.0)
Hemoglobin: 14.6 g/dL (ref 12.0–15.0)
MCH: 29.6 pg (ref 26.0–34.0)
MCHC: 31.9 g/dL (ref 30.0–36.0)
MCV: 92.7 fL (ref 80.0–100.0)
Platelets: 320 10*3/uL (ref 150–400)
RBC: 4.94 MIL/uL (ref 3.87–5.11)
RDW: 13 % (ref 11.5–15.5)
WBC: 14.6 10*3/uL — ABNORMAL HIGH (ref 4.0–10.5)
nRBC: 0 % (ref 0.0–0.2)

## 2020-04-20 MED ORDER — ATORVASTATIN CALCIUM 20 MG PO TABS: 20.0000 mg | ORAL_TABLET | Freq: Every day | ORAL | 3 refills | Status: DC

## 2020-04-20 MED ORDER — METOPROLOL SUCCINATE ER 25 MG PO TB24: 25.0000 mg | ORAL_TABLET | Freq: Every day | ORAL | 3 refills | Status: DC

## 2020-04-20 NOTE — Patient Instructions (Signed)
Stop metoprolol tartrate (lopressor)   Start Metoprolol succinate (toprol xl) 25mg  once a day at bedtime

## 2020-04-20 NOTE — Progress Notes (Addendum)
Primary Care Physician: Biagio Borg, MD Referring Physician:[facility] f/u   [name] is a 60 y.o. female with a h/o PMHofalcohol abuse, asthma, obesity, HTN, HLD, h/o palpitations who presented to the [facility] chief complaint ofoverall weakness, tremors, intermittent headaches, and visual hallucinations. Shereported thatshe has been drinking a pint to 1/5 of liquor daily for the past 2 weeks. In the ED, noted to be in new onset  afib with RVR. CIWA initiated.  She is was d/c on metoprolol 75 mg bid and eliquis 5 mg bid. She did not tolerate Cardizem in the hospital. She states that she feels strange on the metoprolol so she has cut back to 50 mg daily. She is being compliant with eliquis. No bleeding issues.has reminded in SR. No further alcohol use. No caffeine. No street drugs.  States no significant snoring or apnea. Liver enzymes were elevated in the hospital and cholesterol meds stopped. Hepatitis panel negative.    Today, she denies symptoms of palpitations, chest pain, shortness of breath, orthopnea, PND, lower extremity edema, dizziness, presyncope, syncope, or neurologic sequela. The patient is tolerating medications without difficulties and is otherwise without complaint today.   Past Medical History:  Diagnosis Date  . Abscess of bladder 07/28/2016  . Alcohol abuse 07/28/2016   abuse- moderate years ago - states only drinks now on weekends -2 to 8 driinks   . Alcohol dependence ([place])   . Allergic rhinitis 12/06/2013  . Asthma   . Asthma 03/16/2015  . Atrial fibrillation with RVR ([place]) 04/01/2020  . Atrial flutter with rapid ventricular response ([place]) 04/01/2020  . COLONIC POLYPS, HX OF 04/05/2010  . DIVERTICULITIS, HX OF 04/05/2010  . DJD (degenerative joint disease)    right knee, mot to severe  . GERD (gastroesophageal reflux disease)    no meds  . Heart murmur    hx of   . Hyperlipidemia   . Hypertension   . Impaired glucose tolerance 12/06/2013  . Morbid  obesity with BMI of 45.0-49.9, adult ([place])   . Obesity, Class III, BMI 40-49.9 (morbid obesity) ([place]) 10/03/2014  . PALPITATIONS, HX OF 09/14/2007   Past Surgical History:  Procedure Laterality Date  . ABDOMINAL HYSTERECTOMY  age 77   fibroids  . COLONOSCOPY WITH PROPOFOL N/A 07/25/2016   Procedure: COLONOSCOPY WITH PROPOFOL;  Surgeon: Carol Ada, MD;  Location[facility];  Service: Endoscopy;  Laterality: N/A;  . colonscopy     x 2  . IR RADIOLOGIST EVAL & MGMT  08/12/2016  . IR RADIOLOGIST EVAL & MGMT  08/21/2016  . KNEE ARTHROSCOPY     left   . TOTAL KNEE ARTHROPLASTY  07/29/2011   Procedure: TOTAL KNEE ARTHROPLASTY;  Surgeon: Mauri Pole, MD;  Location: [facility];  Service: Orthopedics;  Laterality: Right;  . TOTAL KNEE ARTHROPLASTY Left 12/14/2012   Procedure: LEFT TOTAL KNEE ARTHROPLASTY;  Surgeon: Mauri Pole, MD;  Location: [facility];  Service: Orthopedics;  Laterality: Left;    Current Outpatient Medications  Medication Sig Dispense Refill  . ADVAIR HFA 115-21 MCG/ACT inhaler Inhale 1 puff into the lungs 2 (two) times daily. 3 each 3  . albuterol (VENTOLIN HFA) 108 (90 Base) MCG/ACT inhaler INHALE 2 PUFFS INTO THE LUNGS EVERY 6 HOURS AS NEEDED FOR SHORTNESS OF BREATH 6.7 each 1  . alum & mag hydroxide-simeth (MAALOX/MYLANTA) 200-200-20 MG/5ML suspension Take by mouth every 6 (six) hours as needed for indigestion or heartburn.    . calcium carbonate (TUMS - DOSED IN  MG ELEMENTAL CALCIUM) 500 MG chewable tablet Chew 1 tablet by mouth daily.    . cimetidine (TAGAMET) 200 MG tablet Take 200 mg by mouth every other day.    Marland Kitchen ELIQUIS 5 MG TABS tablet TAKE 1 TABLET BY MOUTH TWICE A DAY 60 tablet 5  . KLOR-CON M20 20 MEQ tablet TAKE 1 TABLET BY MOUTH EVERY DAY 90 tablet 2  . losartan-hydrochlorothiazide (HYZAAR) 100-25 MG tablet Take 1 tablet by mouth daily.    . Metoprolol Tartrate 75 MG TABS Take 75 mg by mouth 2 (two) times daily. 60 tablet 3  . ondansetron (ZOFRAN-ODT) 8 MG  disintegrating tablet Take 1 tablet (8 mg total) by mouth every 8 (eight) hours as needed for nausea or vomiting. 15 tablet 2  . folic acid (FOLVITE) 1 MG tablet Take 1 tablet (1 mg total) by mouth daily. (Patient not taking: Reported on 04/20/2020) 30 tablet 1  . thiamine 100 MG tablet Take 1 tablet (100 mg total) by mouth daily. (Patient not taking: Reported on 04/20/2020) 30 tablet 0   No current facility-administered medications for this encounter.    Allergies  Allergen Reactions  . Morphine Itching  . Shrimp [Shellfish Allergy] Itching and Other (See Comments)    Tongue burns also  . Tramadol Other (See Comments)    Caused confusion  . Diltiazem Hcl Itching    Pt with itching of the feet when bolus given  . Other Other (See Comments)  . Latex Rash    Social History   Socioeconomic History  . Marital status: Married    Spouse name: Not on file  . Number of children: 1  . Years of education: Not on file  . Highest education level: Not on file  Occupational History  . Occupation: book Information systems manager: [employer]  Tobacco Use  . Smoking status: Former Smoker    Packs/day: 0.50    Years: 7.00    Pack years: 3.50    Types: Cigarettes    Quit date: 02/11/1988    Years since quitting: 32.2  . Smokeless tobacco: Never Used  Vaping Use  . Vaping Use: Never used  Substance and Sexual Activity  . Alcohol use: Not Currently    Comment: dependence(5weeks without)  . Drug use: Not Currently    Types: Marijuana    Comment: smoked marijuana x 10 years.  Quit in 1985.  Marland Kitchen Sexual activity: Yes    Birth control/protection: Surgical    Comment: 1st intercourse 60 yo-Fewer than 5 partners  Other Topics Concern  . Not on file  Social History Narrative   Environmental consultant on file.  [facility] April 05, 2010 12:04 PM   Social Determinants of Health   Financial Resource Strain: Not on file  Food Insecurity: Not on file  Transportation Needs: Not on file   Physical Activity: Not on file  Stress: Not on file  Social Connections: Not on file  Intimate Partner Violence: Not on file    Family History  Problem Relation Age of Onset  . Stroke Mother   . COPD Father   . Lymphoma Sister     ROS- All systems are reviewed and negative except as per the HPI above  Physical Exam: Vitals:   04/20/20 0907  BP: (!) 144/82  Pulse: 65  Weight: 130.5 kg  Height: 5\' 1"  (1.549 m)   Wt Readings from Last 3 Encounters:  04/20/20 130.5 kg  04/04/20 (!) 138.3 kg  03/02/20 136.1 kg  Labs: Lab Results  Component Value Date   NA 136 04/04/2020   K 3.4 (L) 04/04/2020   CL 99 04/04/2020   CO2 27 04/04/2020   GLUCOSE 140 (H) 04/04/2020   BUN 17 04/04/2020   CREATININE 0.87 04/04/2020   CALCIUM 8.9 04/04/2020   PHOS 4.3 12/22/2019   MG 1.9 04/03/2020   Lab Results  Component Value Date   INR 1.11 07/28/2016   Lab Results  Component Value Date   CHOL 169 03/01/2020   HDL 66.80 03/01/2020   LDLCALC 90 03/01/2020   TRIG 65.0 03/01/2020     GEN- The patient is well appearing, alert and oriented x 3 today.   Head- normocephalic, atraumatic Eyes-  Sclera clear, conjunctiva pink Ears- hearing intact Oropharynx- clear Neck- supple, no JVP Lymph- no cervical lymphadenopathy Lungs- Clear to ausculation bilaterally, normal work of breathing Heart- Regular rate and rhythm, no murmurs, rubs or gallops, PMI not laterally displaced GI- soft, NT, ND, + BS Extremities- no clubbing, cyanosis, or edema MS- no significant deformity or atrophy Skin- no rash or lesion Psych- euthymic mood, full affect Neuro- strength and sensation are intact  EKG-NSR at 65 bpm, pr int 170 ms, qrs int 94 ms, qtc 434 ms   Echo- 1. Left ventricular ejection fraction, by estimation, is 50 to 55%. The  left ventricle has low normal function. The left ventricle has no regional  wall motion abnormalities. There is mild concentric left ventricular  hypertrophy.  Left ventricular  diastolic parameters are indeterminate.  2. Right ventricular systolic function is normal. The right ventricular  size is normal. Tricuspid regurgitation signal is inadequate for assessing  PA pressure.  3. Left atrial size was mildly dilated.  4. Right atrial size was mildly dilated.  5. A small pericardial effusion is present. The pericardial effusion is  circumferential.  6. The mitral valve is normal in structure. No evidence of mitral valve  regurgitation. No evidence of mitral stenosis.  7. The aortic valve is grossly normal. Aortic valve regurgitation is not  visualized. No aortic stenosis is present.  8. The inferior vena cava is normal in size with <50% respiratory  variability, suggesting right atrial pressure of 8 mmHg.   Comparison(s): Changes from prior study are noted. EF now low normal.   Conclusion(s)/Recommendation(s): Otherwise normal echocardiogram, with  minor abnormalities described in the report.    Assessment and Plan: 1. New onset afib General education re afib  In the setting of alcohol abuse Detoxed in hospital and now off alcohol  Is not using caffeine, other drugs, denies apnea  Will lower BB to 25 mg of metoprolol succinate to take at hs to see if pt can tolerate better   2. CHA2DS2VASc score of 4 Continue eliquis 5 mg bid  Bleeding precautions reviewed  Cbc today   3. BMI of 54.38 Regular exercsie and weight reduction encouraged  4, HTN Stable   5. Elevated liver enzymes in hospital D/c instructions asked for liver enzymes to be rechecked Will order Cmet  as k+ was also low and she was d/c on K+   F/u in afib clinic in one month  Butch Penny C. Kennetha Pearman, [facility] [address] [place], [place] [address] [phone]

## 2020-04-21 ENCOUNTER — Encounter (HOSPITAL_COMMUNITY): Payer: Self-pay

## 2020-04-23 ENCOUNTER — Encounter (HOSPITAL_COMMUNITY): Payer: Self-pay

## 2020-05-04 ENCOUNTER — Telehealth: Payer: Self-pay | Admitting: Internal Medicine

## 2020-05-04 MED ORDER — ALBUTEROL SULFATE HFA 108 (90 BASE) MCG/ACT IN AERS: INHALATION_SPRAY | RESPIRATORY_TRACT | 1 refills | Status: DC

## 2020-05-04 MED ORDER — ADVAIR HFA 115-21 MCG/ACT IN AERO: 1.0000 | INHALATION_SPRAY | Freq: Two times a day (BID) | RESPIRATORY_TRACT | 3 refills | Status: DC

## 2020-05-04 NOTE — Telephone Encounter (Signed)
Pt returning a phone call. Pt's pharmacy is [facility] [address]. Pt can be reached at [phone].

## 2020-05-04 NOTE — Telephone Encounter (Signed)
Called pt and left detailed voice mail per DPR that Albuterol refill was sent to [facility] [address]. Nothing further needed at this time.

## 2020-05-04 NOTE — Telephone Encounter (Signed)
Left message. Will need to verify pharmacy before sending in refill.

## 2020-05-18 ENCOUNTER — Ambulatory Visit (HOSPITAL_COMMUNITY): Payer: BC Managed Care – PPO | Admitting: Nurse Practitioner

## 2020-06-08 ENCOUNTER — Ambulatory Visit: Payer: BC Managed Care – PPO

## 2020-06-19 ENCOUNTER — Other Ambulatory Visit: Payer: Self-pay | Admitting: Internal Medicine

## 2020-07-05 ENCOUNTER — Ambulatory Visit (HOSPITAL_COMMUNITY)
Admission: RE | Admit: 2020-07-05 | Discharge: 2020-07-05 | Disposition: A | Discharge status: Home / Self Care | Payer: BC Managed Care – PPO | Source: Ambulatory Visit | Attending: Nurse Practitioner | Admitting: Nurse Practitioner

## 2020-07-05 ENCOUNTER — Other Ambulatory Visit: Payer: Self-pay

## 2020-07-05 VITALS — BP 146/84 | HR 92 | Ht 61.0 in | Wt 297.6 lb

## 2020-07-05 DIAGNOSIS — E785 Hyperlipidemia, unspecified: Secondary | ICD-10-CM | POA: Diagnosis not present

## 2020-07-05 DIAGNOSIS — Z79899 Other long term (current) drug therapy: Secondary | ICD-10-CM | POA: Insufficient documentation

## 2020-07-05 DIAGNOSIS — J45909 Unspecified asthma, uncomplicated: Secondary | ICD-10-CM | POA: Diagnosis not present

## 2020-07-05 DIAGNOSIS — Z6841 Body Mass Index (BMI) 40.0 and over, adult: Secondary | ICD-10-CM | POA: Diagnosis not present

## 2020-07-05 DIAGNOSIS — D6869 Other thrombophilia: Secondary | ICD-10-CM

## 2020-07-05 DIAGNOSIS — I480 Paroxysmal atrial fibrillation: Secondary | ICD-10-CM

## 2020-07-05 DIAGNOSIS — Z87891 Personal history of nicotine dependence: Secondary | ICD-10-CM | POA: Diagnosis not present

## 2020-07-05 DIAGNOSIS — I10 Essential (primary) hypertension: Secondary | ICD-10-CM | POA: Diagnosis not present

## 2020-07-05 DIAGNOSIS — Z7901 Long term (current) use of anticoagulants: Secondary | ICD-10-CM | POA: Insufficient documentation

## 2020-07-05 NOTE — Progress Notes (Signed)
Primary Care Physician: Biagio Borg, MD Referring Physician:[facility] f/u   [name] is a 60 y.o. female with a h/o PMHofalcohol abuse, asthma, obesity, HTN, HLD, h/o palpitations who presented to the [facility] chief complaint ofoverall weakness, tremors, intermittent headaches, and visual hallucinations. Shereported thatshe has been drinking a pint to 1/5 of liquor daily for the past 2 weeks. In the ED, noted to be in new onset  afib with RVR. CIWA initiated.  She is was d/c on metoprolol 75 mg bid and eliquis 5 mg bid. She did not tolerate Cardizem in the hospital. She states that she feels strange on the metoprolol so she has cut back to 50 mg daily. She is being compliant with eliquis. No bleeding issues.has reminded in SR. No further alcohol use. No caffeine. No street drugs.  States no significant snoring or apnea. Liver enzymes were elevated in the hospital and cholesterol meds stopped. Hepatitis panel negative.   Pt is back in afib clinic today, 5/26, for reports of returning to afib last week. She reports that it happened a couple of times last week when her HR rapidly increased and she felt lightheaded. It would only last a few minutes.She is in SR today. She is drinking a few mixed drinks on the weekend now. None during the week. She has gained 10 lbs.    Today, she denies symptoms of palpitations, chest pain, shortness of breath, orthopnea, PND, lower extremity edema, dizziness, presyncope, syncope, or neurologic sequela. The patient is tolerating medications without difficulties and is otherwise without complaint today.   Past Medical History:  Diagnosis Date  . Abscess of bladder 07/28/2016  . Alcohol abuse 07/28/2016   abuse- moderate years ago - states only drinks now on weekends -2 to 8 driinks   . Alcohol dependence ([place])   . Allergic rhinitis 12/06/2013  . Asthma   . Asthma 03/16/2015  . Atrial fibrillation with RVR ([place]) 04/01/2020  . Atrial flutter with rapid  ventricular response ([place]) 04/01/2020  . COLONIC POLYPS, HX OF 04/05/2010  . DIVERTICULITIS, HX OF 04/05/2010  . DJD (degenerative joint disease)    right knee, mot to severe  . GERD (gastroesophageal reflux disease)    no meds  . Heart murmur    hx of   . Hyperlipidemia   . Hypertension   . Impaired glucose tolerance 12/06/2013  . Morbid obesity with BMI of 45.0-49.9, adult ([place])   . Obesity, Class III, BMI 40-49.9 (morbid obesity) ([place]) 10/03/2014  . PALPITATIONS, HX OF 09/14/2007   Past Surgical History:  Procedure Laterality Date  . ABDOMINAL HYSTERECTOMY  age 89   fibroids  . COLONOSCOPY WITH PROPOFOL N/A 07/25/2016   Procedure: COLONOSCOPY WITH PROPOFOL;  Surgeon: Carol Ada, MD;  [facility];  Service: Endoscopy;  Laterality: N/A;  . colonscopy     x 2  . IR RADIOLOGIST EVAL & MGMT  08/12/2016  . IR RADIOLOGIST EVAL & MGMT  08/21/2016  . KNEE ARTHROSCOPY     left   . TOTAL KNEE ARTHROPLASTY  07/29/2011   Procedure: TOTAL KNEE ARTHROPLASTY;  Surgeon: Mauri Pole, MD;  [facility];  Service: Orthopedics;  Laterality: Right;  . TOTAL KNEE ARTHROPLASTY Left 12/14/2012   Procedure: LEFT TOTAL KNEE ARTHROPLASTY;  Surgeon: Mauri Pole, MD;  [facility];  Service: Orthopedics;  Laterality: Left;    Current Outpatient Medications  Medication Sig Dispense Refill  . albuterol (VENTOLIN HFA) 108 (90 Base) MCG/ACT inhaler INHALE 2  PUFFS INTO THE LUNGS EVERY 6 HOURS AS NEEDED FOR SHORTNESS OF BREATH 8.5 each 1  . alum & mag hydroxide-simeth (MAALOX/MYLANTA) 200-200-20 MG/5ML suspension Take by mouth every 6 (six) hours as needed for indigestion or heartburn.    Marland Kitchen atorvastatin (LIPITOR) 20 MG tablet Take 1 tablet (20 mg total) by mouth daily. 90 tablet 3  . calcium carbonate (TUMS - DOSED IN MG ELEMENTAL CALCIUM) 500 MG chewable tablet Chew 1 tablet by mouth daily.    . cimetidine (TAGAMET) 200 MG tablet Take 200 mg by mouth every other day.    Marland Kitchen  Dextromethorphan-guaiFENesin 10-100 MG/5ML liquid TAKE 5 MLS BY MOUTH EVERY FOUR HOURS AS NEEDED FOR COUGH. 118 mL 0  . ELIQUIS 5 MG TABS tablet TAKE 1 TABLET BY MOUTH TWICE A DAY 60 tablet 5  . folic acid (FOLVITE) 1 MG tablet Take 1 tablet (1 mg total) by mouth daily. (Patient not taking: Reported on 04/20/2020) 30 tablet 1  . folic acid (FOLVITE) 1 MG tablet TAKE 1 TABLET (1 MG TOTAL) BY MOUTH DAILY. 30 tablet 1  . KLOR-CON M20 20 MEQ tablet TAKE 1 TABLET BY MOUTH EVERY DAY 90 tablet 2  . losartan-hydrochlorothiazide (HYZAAR) 100-25 MG tablet Take 1 tablet by mouth daily.    . metoprolol succinate (TOPROL XL) 25 MG 24 hr tablet Take 1 tablet (25 mg total) by mouth at bedtime. 30 tablet 3  . metoprolol tartrate (LOPRESSOR) 50 MG tablet TAKE 1 AND 1/2 TABLETS (75 MG) BY MOUTH TWO TIMES DAILY. 90 tablet 3  . ondansetron (ZOFRAN-ODT) 8 MG disintegrating tablet Take 1 tablet (8 mg total) by mouth every 8 (eight) hours as needed for nausea or vomiting. 15 tablet 2  . thiamine 100 MG tablet Take 1 tablet (100 mg total) by mouth daily. (Patient not taking: Reported on 04/20/2020) 30 tablet 0  . thiamine 100 MG tablet TAKE 1 TABLET (100 MG TOTAL) BY MOUTH DAILY. 30 tablet 0   No current facility-administered medications for this encounter.    Allergies  Allergen Reactions  . Morphine Itching  . Shrimp [Shellfish Allergy] Itching and Other (See Comments)    Tongue burns also  . Tramadol Other (See Comments)    Caused confusion  . Diltiazem Hcl Itching    Pt with itching of the feet when bolus given  . Other Other (See Comments)  . Latex Rash    Social History   Socioeconomic History  . Marital status: Married    Spouse name: Not on file  . Number of children: 1  . Years of education: Not on file  . Highest education level: Not on file  Occupational History  . Occupation: book Information systems manager: [employer]  Tobacco Use  . Smoking status: Former Smoker    Packs/day: 0.50     Years: 7.00    Pack years: 3.50    Types: Cigarettes    Quit date: 02/11/1988    Years since quitting: 32.4  . Smokeless tobacco: Never Used  Vaping Use  . Vaping Use: Never used  Substance and Sexual Activity  . Alcohol use: Not Currently    Comment: dependence(5weeks without)  . Drug use: Not Currently    Types: Marijuana    Comment: smoked marijuana x 10 years.  Quit in 1985.  Marland Kitchen Sexual activity: Yes    Birth control/protection: Surgical    Comment: 1st intercourse 60 yo-Fewer than 5 partners  Other Topics Concern  . Not on file  Social  History Narrative   Environmental consultant on file.  [facility] April 05, 2010 12:04 PM   Social Determinants of Health   Financial Resource Strain: Not on file  Food Insecurity: Not on file  Transportation Needs: Not on file  Physical Activity: Not on file  Stress: Not on file  Social Connections: Not on file  Intimate Partner Violence: Not on file    Family History  Problem Relation Age of Onset  . Stroke Mother   . COPD Father   . Lymphoma Sister     ROS- All systems are reviewed and negative except as per the HPI above  Physical Exam: There were no vitals filed for this visit. Wt Readings from Last 3 Encounters:  04/20/20 130.5 kg  04/04/20 (!) 138.3 kg  03/02/20 136.1 kg    Labs: Lab Results  Component Value Date   NA 139 04/20/2020   K 3.6 04/20/2020   CL 103 04/20/2020   CO2 29 04/20/2020   GLUCOSE 104 (H) 04/20/2020   BUN 13 04/20/2020   CREATININE 1.03 (H) 04/20/2020   CALCIUM 9.4 04/20/2020   PHOS 4.3 12/22/2019   MG 1.9 04/03/2020   Lab Results  Component Value Date   INR 1.11 07/28/2016   Lab Results  Component Value Date   CHOL 169 03/01/2020   HDL 66.80 03/01/2020   LDLCALC 90 03/01/2020   TRIG 65.0 03/01/2020     GEN- The patient is well appearing, alert and oriented x 3 today.   Head- normocephalic, atraumatic Eyes-  Sclera clear, conjunctiva pink Ears- hearing intact Oropharynx-  clear Neck- supple, no JVP Lymph- no cervical lymphadenopathy Lungs- Clear to ausculation bilaterally, normal work of breathing Heart- Regular rate and rhythm, no murmurs, rubs or gallops, PMI not laterally displaced GI- soft, NT, ND, + BS Extremities- no clubbing, cyanosis, or edema MS- no significant deformity or atrophy Skin- no rash or lesion Psych- euthymic mood, full affect Neuro- strength and sensation are intact  EKG-NSR at 92  bpm, pr int 162 ms, qrs int 94 ms, qtc 464 ms   Echo- 1. Left ventricular ejection fraction, by estimation, is 50 to 55%. The  left ventricle has low normal function. The left ventricle has no regional  wall motion abnormalities. There is mild concentric left ventricular  hypertrophy. Left ventricular  diastolic parameters are indeterminate.  2. Right ventricular systolic function is normal. The right ventricular  size is normal. Tricuspid regurgitation signal is inadequate for assessing  PA pressure.  3. Left atrial size was mildly dilated.  4. Right atrial size was mildly dilated.  5. A small pericardial effusion is present. The pericardial effusion is  circumferential.  6. The mitral valve is normal in structure. No evidence of mitral valve  regurgitation. No evidence of mitral stenosis.  7. The aortic valve is grossly normal. Aortic valve regurgitation is not  visualized. No aortic stenosis is present.  8. The inferior vena cava is normal in size with <50% respiratory  variability, suggesting right atrial pressure of 8 mmHg.   Comparison(s): Changes from prior study are noted. EF now low normal.   Conclusion(s)/Recommendation(s): Otherwise normal echocardiogram, with  minor abnormalities described in the report.    Assessment and Plan: 1. Paroxysmal  afib Discussed afib and how to manage at home She still has very low burden  Is back to drinking  a few alcoholic drinks during the weekend Advised  no more 2 a week   Is not using  caffeine,  other drugs, denies apnea  Continue 25 mg of metoprolol succinate at hs  Discussed  how to take prn extra BB if needed   2. CHA2DS2VASc score of 4 Continue eliquis 5 mg bid     3. BMI of 54.38 Has gained 10 lbs  Regular exercsie and weight reduction encouraged  4, HTN Stable    F/u in afib clinic in six months, sooner if needed   Butch Penny C. Genean Adamski, [facility] [address] [place], [place] [address] [phone]

## 2020-07-07 ENCOUNTER — Other Ambulatory Visit: Payer: Self-pay | Admitting: Internal Medicine

## 2020-07-13 ENCOUNTER — Other Ambulatory Visit: Payer: Self-pay

## 2020-07-13 ENCOUNTER — Encounter (HOSPITAL_COMMUNITY): Payer: Self-pay | Admitting: Emergency Medicine

## 2020-07-13 ENCOUNTER — Ambulatory Visit (HOSPITAL_COMMUNITY)
Admission: EM | Admit: 2020-07-13 | Discharge: 2020-07-13 | Disposition: A | Discharge status: Home / Self Care | Payer: BC Managed Care – PPO | Attending: Physician Assistant | Admitting: Physician Assistant

## 2020-07-13 DIAGNOSIS — Z87891 Personal history of nicotine dependence: Secondary | ICD-10-CM | POA: Insufficient documentation

## 2020-07-13 DIAGNOSIS — I10 Essential (primary) hypertension: Secondary | ICD-10-CM | POA: Diagnosis not present

## 2020-07-13 DIAGNOSIS — Z79899 Other long term (current) drug therapy: Secondary | ICD-10-CM | POA: Insufficient documentation

## 2020-07-13 DIAGNOSIS — R059 Cough, unspecified: Secondary | ICD-10-CM | POA: Insufficient documentation

## 2020-07-13 DIAGNOSIS — Z888 Allergy status to other drugs, medicaments and biological substances status: Secondary | ICD-10-CM | POA: Diagnosis not present

## 2020-07-13 DIAGNOSIS — Z20822 Contact with and (suspected) exposure to covid-19: Secondary | ICD-10-CM | POA: Insufficient documentation

## 2020-07-13 DIAGNOSIS — J069 Acute upper respiratory infection, unspecified: Secondary | ICD-10-CM | POA: Diagnosis not present

## 2020-07-13 DIAGNOSIS — Z885 Allergy status to narcotic agent status: Secondary | ICD-10-CM | POA: Diagnosis not present

## 2020-07-13 DIAGNOSIS — R112 Nausea with vomiting, unspecified: Secondary | ICD-10-CM | POA: Insufficient documentation

## 2020-07-13 LAB — CBC WITH DIFFERENTIAL/PLATELET
Abs Immature Granulocytes: 0.04 10*3/uL (ref 0.00–0.07)
Basophils Absolute: 0 10*3/uL (ref 0.0–0.1)
Basophils Relative: 0 %
Eosinophils Absolute: 0.1 10*3/uL (ref 0.0–0.5)
Eosinophils Relative: 1 %
HCT: 46.3 % — ABNORMAL HIGH (ref 36.0–46.0)
Hemoglobin: 15.3 g/dL — ABNORMAL HIGH (ref 12.0–15.0)
Immature Granulocytes: 0 %
Lymphocytes Relative: 12 %
Lymphs Abs: 1.5 10*3/uL (ref 0.7–4.0)
MCH: 30.2 pg (ref 26.0–34.0)
MCHC: 33 g/dL (ref 30.0–36.0)
MCV: 91.3 fL (ref 80.0–100.0)
Monocytes Absolute: 0.7 10*3/uL (ref 0.1–1.0)
Monocytes Relative: 6 %
Neutro Abs: 9.6 10*3/uL — ABNORMAL HIGH (ref 1.7–7.7)
Neutrophils Relative %: 81 %
Platelets: 263 10*3/uL (ref 150–400)
RBC: 5.07 MIL/uL (ref 3.87–5.11)
RDW: 14.6 % (ref 11.5–15.5)
WBC: 12 10*3/uL — ABNORMAL HIGH (ref 4.0–10.5)
nRBC: 0.2 % (ref 0.0–0.2)

## 2020-07-13 LAB — COMPREHENSIVE METABOLIC PANEL
ALT: 45 U/L — ABNORMAL HIGH (ref 0–44)
AST: 58 U/L — ABNORMAL HIGH (ref 15–41)
Albumin: 3.5 g/dL (ref 3.5–5.0)
Alkaline Phosphatase: 112 U/L (ref 38–126)
Anion gap: 12 (ref 5–15)
BUN: 13 mg/dL (ref 6–20)
CO2: 27 mmol/L (ref 22–32)
Calcium: 9.1 mg/dL (ref 8.9–10.3)
Chloride: 97 mmol/L — ABNORMAL LOW (ref 98–111)
Creatinine, Ser: 0.86 mg/dL (ref 0.44–1.00)
GFR, Estimated: >60 mL/min (ref >60)
Glucose, Bld: 92 mg/dL (ref 70–99)
Potassium: 5.7 mmol/L — ABNORMAL HIGH (ref 3.5–5.1)
Sodium: 136 mmol/L (ref 135–145)
Total Bilirubin: 0.4 mg/dL (ref 0.3–1.2)
Total Protein: 7.6 g/dL (ref 6.5–8.1)

## 2020-07-13 LAB — POC INFLUENZA A AND B ANTIGEN (URGENT CARE ONLY)
INFLUENZA A ANTIGEN, POC: NEGATIVE
INFLUENZA B ANTIGEN, POC: NEGATIVE

## 2020-07-13 LAB — SARS CORONAVIRUS 2 (TAT 6-24 HRS): SARS Coronavirus 2: NEGATIVE

## 2020-07-13 MED ORDER — ONDANSETRON 4 MG PO TBDP: 4.0000 mg | ORAL_TABLET | Freq: Three times a day (TID) | ORAL | 0 refills | Status: DC | PRN

## 2020-07-13 MED ORDER — ONDANSETRON 4 MG PO TBDP
ORAL_TABLET | ORAL | Status: AC
  Filled 2020-07-13: qty 1

## 2020-07-13 MED ORDER — ONDANSETRON 4 MG PO TBDP
4.0000 mg | ORAL_TABLET | Freq: Once | ORAL | Status: AC
  Administered 2020-07-13: 4 mg via ORAL

## 2020-07-13 MED ORDER — PROMETHAZINE-DM 6.25-15 MG/5ML PO SYRP: 5.0000 mL | ORAL_SOLUTION | Freq: Three times a day (TID) | ORAL | 0 refills | Status: DC | PRN

## 2020-07-13 NOTE — ED Triage Notes (Signed)
Cough for 2 days. Reports vomiting this morning, but states she was "throwing up phlegm." Reports traces of blood in phlegm. Has tried robitussin without relief.

## 2020-07-13 NOTE — Discharge Instructions (Addendum)
I have prescribed Promethazine DM to be used up to 3 times a day as needed for cough.  This will make you sleepy so do not drive or drink alcohol with it.  You should not take additional over-the-counter medications that contain dextromethorphan such as Mucinex DM or NyQuil while using this medication.  Take Zofran for nausea symptoms.  We will contact you with your lab results as soon as we have them.  Make sure you are drinking plenty of fluid.  Please follow-up with your PCP as soon as possible for reevaluation.  If anything worsens go to the emergency room.  Your blood pressure is very elevated.  Please monitor this at home and if this remains elevated follow-up with your primary care provider or our office.  If you develop any chest pain, shortness of breath, headache, dizziness you need to go to the emergency room.  Avoid any decongestants as this can elevate your blood pressure.

## 2020-07-13 NOTE — ED Provider Notes (Signed)
[name]    CSN: [id number] Arrival date & time: 07/13/20  0801      History   Chief Complaint Chief Complaint  Patient presents with  . Cough    HPI [name] is a 60 y.o. female.   Patient presents today with a 2-day history of cough.  Reports episode of posttussive nausea and vomiting earlier today.  Describes cough as productive with green sputum and occasional streaks of blood; denies frank hemoptysis.  She denies any fever, chest pain, shortness of breath, headaches, nasal congestion.  Reports son was sick with similar symptoms.  She has tried Robitussin as well as prescribed medications including albuterol without improvement of symptoms.  She does have a history of asthma and allergies and has been taking medication as prescribed.  She has not had an influenza vaccine but is up-to-date on COVID-19 vaccinations including booster.  Denies any recent antibiotic use.  She is having difficulty with daily activities including sleeping through the night as a result of cough symptoms.     Past Medical History:  Diagnosis Date  . Abscess of bladder 07/28/2016  . Alcohol abuse 07/28/2016   abuse- moderate years ago - states only drinks now on weekends -2 to 8 driinks   . Alcohol dependence ([place])   . Allergic rhinitis 12/06/2013  . Asthma   . Asthma 03/16/2015  . Atrial fibrillation with RVR ([place]) 04/01/2020  . Atrial flutter with rapid ventricular response ([place]) 04/01/2020  . COLONIC POLYPS, HX OF 04/05/2010  . DIVERTICULITIS, HX OF 04/05/2010  . DJD (degenerative joint disease)    right knee, mot to severe  . GERD (gastroesophageal reflux disease)    no meds  . Heart murmur    hx of   . Hyperlipidemia   . Hypertension   . Impaired glucose tolerance 12/06/2013  . Morbid obesity with BMI of 45.0-49.9, adult ([place])   . Obesity, Class III, BMI 40-49.9 (morbid obesity) ([place]) 10/03/2014  . PALPITATIONS, HX OF 09/14/2007    Patient Active Problem List   Diagnosis  Date Noted  . Atrial fibrillation with RVR ([place]) 04/01/2020  . Atrial flutter with rapid ventricular response ([place]) 04/01/2020  . Alcohol dependence with withdrawal ([place]) 12/19/2019  . History of pulmonary embolism 12/19/2019  . Pneumonia due to COVID-19 virus 02/10/2019  . Pulmonary embolism ([place]) 07/06/2018  . Leukocytosis 09/22/2017  . Rash 09/22/2017  . Sepsis ([place]) 09/12/2016  . Colovesical fistula 09/12/2016  . Hypertension 07/28/2016  . Alcohol abuse 07/28/2016  . Colonic diverticulum 07/28/2016  . Hyperlipidemia 07/10/2016  . Mass of left side of neck 07/10/2016  . Head and neck lymphadenopathy 07/10/2016  . Eustachian tube disorder 01/25/2016  . Peripheral edema 03/16/2015  . Asthma 03/16/2015  . Right shoulder pain 03/16/2015  . Hypokalemia 10/04/2014  . Upper airway cough syndrome 10/03/2014  . Obesity, Class III, BMI 40-49.9 (morbid obesity) ([place]) 10/03/2014  . Lower back pain 05/25/2014  . Bilateral shoulder pain 05/25/2014  . Allergic rhinitis 12/06/2013  . Impaired glucose tolerance 12/06/2013  . Expected blood loss anemia 12/16/2012  . S/P left TKA 12/14/2012  . Goiter 11/26/2012  . Preop exam for internal medicine 11/26/2012  . Vaginal bleeding 04/30/2011  . Colon polyps 02/10/2011  . Eczema 02/10/2011  . Depression 02/10/2011  . Preventative health care 09/27/2010  . Anxiety state 04/05/2010  . DIVERTICULITIS, HX OF 04/05/2010  . PALPITATIONS, HX OF 09/14/2007  . Essential hypertension 04/24/2007  . VOCAL CORD DISORDER 04/24/2007  .  Extrinsic asthma 04/24/2007  . GERD 04/24/2007    Past Surgical History:  Procedure Laterality Date  . ABDOMINAL HYSTERECTOMY  age 36   fibroids  . COLONOSCOPY WITH PROPOFOL N/A 07/25/2016   Procedure: COLONOSCOPY WITH PROPOFOL;  Surgeon: Carol Ada, MD;  Location: [facility];  Service: Endoscopy;  Laterality: N/A;  . colonscopy     x 2  . IR RADIOLOGIST EVAL & MGMT  08/12/2016  . IR RADIOLOGIST EVAL & MGMT   08/21/2016  . KNEE ARTHROSCOPY     left   . TOTAL KNEE ARTHROPLASTY  07/29/2011   Procedure: TOTAL KNEE ARTHROPLASTY;  Surgeon: Mauri Pole, MD;  Location: [facility];  Service: Orthopedics;  Laterality: Right;  . TOTAL KNEE ARTHROPLASTY Left 12/14/2012   Procedure: LEFT TOTAL KNEE ARTHROPLASTY;  Surgeon: Mauri Pole, MD;  Location: [facility];  Service: Orthopedics;  Laterality: Left;    OB History    Gravida  1   Para  1   Term      Preterm      AB      Living  1     SAB      IAB      Ectopic      Multiple      Live Births               Home Medications    Prior to Admission medications   Medication Sig Start Date End Date Taking? Authorizing Provider  ondansetron (ZOFRAN ODT) 4 MG disintegrating tablet Take 1 tablet (4 mg total) by mouth every 8 (eight) hours as needed for nausea or vomiting. 07/13/20  Yes Calliope Delangel K, PA-C  promethazine-dextromethorphan (PROMETHAZINE-DM) 6.25-15 MG/5ML syrup Take 5 mLs by mouth 3 (three) times daily as needed for cough. 07/13/20  Yes Korinna Tat K, PA-C  albuterol (VENTOLIN HFA) 108 (90 Base) MCG/ACT inhaler INHALE 2 PUFFS INTO THE LUNGS EVERY 6 HOURS AS NEEDED FOR SHORTNESS OF BREATH 06/19/20   Tanda Rockers, MD  alum & mag hydroxide-simeth (MAALOX/MYLANTA) 200-200-20 MG/5ML suspension Take by mouth every 6 (six) hours as needed for indigestion or heartburn.    [provider]  atorvastatin (LIPITOR) 20 MG tablet Take 1 tablet (20 mg total) by mouth daily. 04/20/20 04/20/21  Biagio Borg, MD  calcium carbonate (TUMS - DOSED IN MG ELEMENTAL CALCIUM) 500 MG chewable tablet Chew 1 tablet by mouth daily.    [provider]  cimetidine (TAGAMET) 200 MG tablet Take 200 mg by mouth every other day.    [provider]  ELIQUIS 5 MG TABS tablet TAKE 1 TABLET BY MOUTH TWICE A DAY 01/25/20   Tanda Rockers, MD  KLOR-CON M20 20 MEQ tablet TAKE 1 TABLET BY MOUTH EVERY DAY 04/26/19   Biagio Borg, MD   losartan-hydrochlorothiazide (HYZAAR) 100-25 MG tablet Take 1 tablet by mouth daily. 03/11/20   [provider]  metoprolol succinate (TOPROL XL) 25 MG 24 hr tablet Take 1 tablet (25 mg total) by mouth at bedtime. Patient taking differently: Take 25 mg by mouth daily. 04/20/20 04/20/21  Sherran Needs, NP  triamcinolone cream (KENALOG) 0.1 % APPLY TO AFFECTED AREA TWICE A DAY 07/08/20   Biagio Borg, MD    Family History Family History  Problem Relation Age of Onset  . Stroke Mother   . COPD Father   . Lymphoma Sister     Social History Social History   Tobacco Use  . Smoking status:  Former Smoker    Packs/day: 0.50    Years: 7.00    Pack years: 3.50    Types: Cigarettes    Quit date: 02/11/1988    Years since quitting: 32.4  . Smokeless tobacco: Never Used  Vaping Use  . Vaping Use: Never used  Substance Use Topics  . Alcohol use: Not Currently    Comment: dependence(5weeks without)  . Drug use: Not Currently    Types: Marijuana    Comment: smoked marijuana x 10 years.  Quit in 1985.     Allergies   Morphine, Shrimp [shellfish allergy], Tramadol, Diltiazem hcl, Other, and Latex   Review of Systems Review of Systems  Constitutional: Positive for activity change and fatigue. Negative for appetite change.  HENT: Positive for congestion and voice change (hoarseness). Negative for sinus pressure, sneezing and sore throat.   Respiratory: Positive for cough. Negative for shortness of breath.   Cardiovascular: Negative for chest pain.  Gastrointestinal: Positive for nausea and vomiting. Negative for abdominal pain and diarrhea.  Musculoskeletal: Negative for arthralgias and myalgias.  Neurological: Negative for dizziness, light-headedness and headaches.     Physical Exam Triage Vital Signs ED Triage Vitals  Enc Vitals Group     BP 07/13/20 0814 (!) 176/99     Pulse Rate 07/13/20 0814 80     Resp 07/13/20 0814 16     Temp 07/13/20 0814 98 F (36.7 C)      Temp Source 07/13/20 0814 Oral     SpO2 07/13/20 0814 95 %     Weight --      Height --      Head Circumference --      Peak Flow --      Pain Score 07/13/20 0812 2     Pain Loc --      Pain Edu? --      Excl. in [place]? --    No data found.  Updated Vital Signs BP (!) 176/99   Pulse 80   Temp 98 F (36.7 C) (Oral)   Resp 16   SpO2 95%   Visual Acuity Right Eye Distance:   Left Eye Distance:   Bilateral Distance:    Right Eye Near:   Left Eye Near:    Bilateral Near:     Physical Exam Vitals reviewed.  Constitutional:      General: She is awake. She is not in acute distress.    Appearance: Normal appearance. She is overweight. She is not ill-appearing.     Comments: Very pleasant female appears stated age in no acute distress  HENT:     Head: Normocephalic and atraumatic.     Right Ear: Tympanic membrane, ear canal and external ear normal. Tympanic membrane is not erythematous or bulging.     Left Ear: Tympanic membrane, ear canal and external ear normal. Tympanic membrane is not erythematous or bulging.     Nose:     Right Sinus: No maxillary sinus tenderness or frontal sinus tenderness.     Left Sinus: No maxillary sinus tenderness or frontal sinus tenderness.     Mouth/Throat:     Pharynx: Uvula midline. No oropharyngeal exudate or posterior oropharyngeal erythema.  Cardiovascular:     Rate and Rhythm: Normal rate and regular rhythm.     Heart sounds: Normal heart sounds. No murmur heard.   Pulmonary:     Effort: Pulmonary effort is normal.     Breath sounds: Normal breath sounds. No wheezing, rhonchi or rales.  Comments: Clear to auscultation bilaterally Abdominal:     General: Bowel sounds are normal.     Palpations: Abdomen is soft.     Tenderness: There is no abdominal tenderness.  Musculoskeletal:     Right lower leg: No edema.     Left lower leg: No edema.  Lymphadenopathy:     Head:     Right side of head: No submental, submandibular or  tonsillar adenopathy.     Left side of head: No submental, submandibular or tonsillar adenopathy.     Cervical: No cervical adenopathy.  Psychiatric:        Behavior: Behavior is cooperative.      UC Treatments / Results  Labs (all labs ordered are listed, but only abnormal results are displayed) Labs Reviewed  SARS CORONAVIRUS 2 (TAT 6-24 HRS)  CBC WITH DIFFERENTIAL/PLATELET  COMPREHENSIVE METABOLIC PANEL  POC INFLUENZA A AND B ANTIGEN (URGENT CARE ONLY)    EKG   Radiology No results found.  Procedures Procedures (including critical care time)  Medications Ordered in UC Medications  ondansetron (ZOFRAN-ODT) disintegrating tablet 4 mg (4 mg Oral Given 07/13/20 0902)    Initial Impression / Assessment and Plan / UC Course  I have reviewed the triage vital signs and the nursing notes.  Pertinent labs & imaging results that were available during my care of the patient were reviewed by me and considered in my medical decision making (see chart for details).     Flu testing was negative in office.  COVID-19 testing is pending.  CBC and CMP were drawn today given nausea and vomiting symptoms.  Patient is a candidate for antivirals if COVID-19 testing is positive.  She was prescribed Promethazine DM with instruction not to drive or drink alcohol with this medication as drowsiness is a common side effect.  She is not to take additional medications that contain dextromethorphan as is to be too much of the same medication.  She was given Zofran in office and prescribed this to have at home to help manage symptoms.  Discussed the importance of eating small frequent meals and pushing fluids.  Discussed alarm symptoms that warrant emergent evaluation.  Recommend she follow-up with PCP within 1 week.  Strict return precautions given to which patient expressed understanding.  Patient's blood pressure was noted to be elevated today.  She denies any symptoms of endorgan damage.  Recommend she  monitor this at home and if this remains elevated follow-up with our clinic or PCP.  Discussed alarm symptoms that warrant emergent evaluation.  She is to follow-up with our clinic or PCP within 1 week for blood pressure recheck.  Final Clinical Impressions(s) / UC Diagnoses   Final diagnoses:  Viral URI with cough  Cough  Nausea and vomiting, intractability of vomiting not specified, unspecified vomiting type  Elevated blood pressure reading in office with diagnosis of hypertension     Discharge Instructions     I have prescribed Promethazine DM to be used up to 3 times a day as needed for cough.  This will make you sleepy so do not drive or drink alcohol with it.  You should not take additional over-the-counter medications that contain dextromethorphan such as Mucinex DM or NyQuil while using this medication.  Take Zofran for nausea symptoms.  We will contact you with your lab results as soon as we have them.  Make sure you are drinking plenty of fluid.  Please follow-up with your PCP as soon as possible for reevaluation.  If anything worsens go to the emergency room.  Your blood pressure is very elevated.  Please monitor this at home and if this remains elevated follow-up with your primary care provider or our office.  If you develop any chest pain, shortness of breath, headache, dizziness you need to go to the emergency room.  Avoid any decongestants as this can elevate your blood pressure.    ED Prescriptions    Medication Sig Dispense Auth. Provider   promethazine-dextromethorphan (PROMETHAZINE-DM) 6.25-15 MG/5ML syrup Take 5 mLs by mouth 3 (three) times daily as needed for cough. 118 mL Esteen Delpriore K, PA-C   ondansetron (ZOFRAN ODT) 4 MG disintegrating tablet Take 1 tablet (4 mg total) by mouth every 8 (eight) hours as needed for nausea or vomiting. 20 tablet Dwayne Begay, Derry Skill, PA-C     PDMP not reviewed this encounter.   Terrilee Croak, PA-C 07/13/20 [id number]

## 2020-07-16 ENCOUNTER — Telehealth (HOSPITAL_COMMUNITY): Payer: Self-pay | Admitting: Emergency Medicine

## 2020-07-16 MED ORDER — PROMETHAZINE-DM 6.25-15 MG/5ML PO SYRP: 5.0000 mL | ORAL_SOLUTION | Freq: Three times a day (TID) | ORAL | 0 refills | Status: DC | PRN

## 2020-07-16 NOTE — Telephone Encounter (Signed)
Called to review refill sent in for patient, and she had several questions about the course of her illness.  Asked about persistent cough, and states she has been taking her oxygen level on her finger and her machine is reading 88-90%... "is that good?".  This RN asked questions about SOB, which patient seems to be experiencing some, especially with exertion, and she encouraged patient to come back in for re-evaluation.  Patient verbalized understanding.

## 2020-07-16 NOTE — Telephone Encounter (Signed)
Okay's by Junie Panning, APP to resend refill to pharmacy on cough syrup patient spilled over the weekend.

## 2020-07-18 ENCOUNTER — Telehealth (HOSPITAL_COMMUNITY): Payer: Self-pay | Admitting: Emergency Medicine

## 2020-07-18 NOTE — Telephone Encounter (Signed)
This RN called to follow up after patient did not return for re-evaluation.  She sounded well on the phone, states she is only coughing occasionally.  I asked about her O2 and she said she had it on her finger and it was reading 83.  After telling her that was still concerning, she states "oh my goodness, this whole time I have been reading my pulse.  It looksl ike my oxygen is 97%".  Correlating with patient's well sounding demeanor, no need for further evaluation.  She states PCP follow-up Friday

## 2020-07-20 ENCOUNTER — Encounter: Payer: Self-pay | Admitting: Internal Medicine

## 2020-07-20 ENCOUNTER — Ambulatory Visit (INDEPENDENT_AMBULATORY_CARE_PROVIDER_SITE_OTHER): Payer: BC Managed Care – PPO | Admitting: Internal Medicine

## 2020-07-20 ENCOUNTER — Other Ambulatory Visit: Payer: Self-pay

## 2020-07-20 ENCOUNTER — Ambulatory Visit (INDEPENDENT_AMBULATORY_CARE_PROVIDER_SITE_OTHER): Payer: BC Managed Care – PPO

## 2020-07-20 VITALS — BP 152/88 | HR 88 | Temp 98.2°F | Ht 61.0 in | Wt 294.0 lb

## 2020-07-20 DIAGNOSIS — R059 Cough, unspecified: Secondary | ICD-10-CM

## 2020-07-20 DIAGNOSIS — J4531 Mild persistent asthma with (acute) exacerbation: Secondary | ICD-10-CM | POA: Diagnosis not present

## 2020-07-20 DIAGNOSIS — E538 Deficiency of other specified B group vitamins: Secondary | ICD-10-CM | POA: Diagnosis not present

## 2020-07-20 DIAGNOSIS — R7302 Impaired glucose tolerance (oral): Secondary | ICD-10-CM | POA: Diagnosis not present

## 2020-07-20 DIAGNOSIS — R609 Edema, unspecified: Secondary | ICD-10-CM

## 2020-07-20 DIAGNOSIS — E7800 Pure hypercholesterolemia, unspecified: Secondary | ICD-10-CM

## 2020-07-20 DIAGNOSIS — E875 Hyperkalemia: Secondary | ICD-10-CM

## 2020-07-20 DIAGNOSIS — J309 Allergic rhinitis, unspecified: Secondary | ICD-10-CM

## 2020-07-20 DIAGNOSIS — F32A Depression, unspecified: Secondary | ICD-10-CM

## 2020-07-20 DIAGNOSIS — R058 Other specified cough: Secondary | ICD-10-CM | POA: Diagnosis not present

## 2020-07-20 DIAGNOSIS — I10 Essential (primary) hypertension: Secondary | ICD-10-CM

## 2020-07-20 DIAGNOSIS — E559 Vitamin D deficiency, unspecified: Secondary | ICD-10-CM

## 2020-07-20 DIAGNOSIS — I517 Cardiomegaly: Secondary | ICD-10-CM | POA: Diagnosis not present

## 2020-07-20 DIAGNOSIS — K219 Gastro-esophageal reflux disease without esophagitis: Secondary | ICD-10-CM

## 2020-07-20 DIAGNOSIS — Z0001 Encounter for general adult medical examination with abnormal findings: Secondary | ICD-10-CM

## 2020-07-20 LAB — CBC WITH DIFFERENTIAL/PLATELET
Basophils Absolute: 0 10*3/uL (ref 0.0–0.1)
Basophils Relative: 0.5 % (ref 0.0–3.0)
Eosinophils Absolute: 0.2 10*3/uL (ref 0.0–0.7)
Eosinophils Relative: 1.9 % (ref 0.0–5.0)
HCT: 45 % (ref 36.0–46.0)
Hemoglobin: 14.8 g/dL (ref 12.0–15.0)
Lymphocytes Relative: 16.5 % (ref 12.0–46.0)
Lymphs Abs: 1.7 10*3/uL (ref 0.7–4.0)
MCHC: 33 g/dL (ref 30.0–36.0)
MCV: 91.2 fl (ref 78.0–100.0)
Monocytes Absolute: 0.9 10*3/uL (ref 0.1–1.0)
Monocytes Relative: 8.6 % (ref 3.0–12.0)
Neutro Abs: 7.5 10*3/uL (ref 1.4–7.7)
Neutrophils Relative %: 72.5 % (ref 43.0–77.0)
Platelets: 209 10*3/uL (ref 150.0–400.0)
RBC: 4.94 Mil/uL (ref 3.87–5.11)
RDW: 15 % (ref 11.5–15.5)
WBC: 10.3 10*3/uL (ref 4.0–10.5)

## 2020-07-20 LAB — URINALYSIS, ROUTINE W REFLEX MICROSCOPIC
Bilirubin Urine: NEGATIVE
Ketones, ur: NEGATIVE
Leukocytes,Ua: NEGATIVE
Nitrite: NEGATIVE
Specific Gravity, Urine: 1.025 (ref 1.000–1.030)
Urine Glucose: NEGATIVE
Urobilinogen, UA: 1 (ref 0.0–1.0)
pH: 6 (ref 5.0–8.0)

## 2020-07-20 LAB — VITAMIN D 25 HYDROXY (VIT D DEFICIENCY, FRACTURES): VITD: 14.75 ng/mL — ABNORMAL LOW (ref 30.00–100.00)

## 2020-07-20 LAB — HEPATIC FUNCTION PANEL
ALT: 34 U/L (ref 0–35)
AST: 39 U/L — ABNORMAL HIGH (ref 0–37)
Albumin: 3.7 g/dL (ref 3.5–5.2)
Alkaline Phosphatase: 111 U/L (ref 39–117)
Bilirubin, Direct: 0.1 mg/dL (ref 0.0–0.3)
Total Bilirubin: 0.6 mg/dL (ref 0.2–1.2)
Total Protein: 7.4 g/dL (ref 6.0–8.3)

## 2020-07-20 LAB — BASIC METABOLIC PANEL
BUN: 13 mg/dL (ref 6–23)
CO2: 34 mEq/L — ABNORMAL HIGH (ref 19–32)
Calcium: 9.5 mg/dL (ref 8.4–10.5)
Chloride: 99 mEq/L (ref 96–112)
Creatinine, Ser: 0.87 mg/dL (ref 0.40–1.20)
GFR: 72.98 mL/min (ref >60.00)
Glucose, Bld: 110 mg/dL — ABNORMAL HIGH (ref 70–99)
Potassium: 3 mEq/L — ABNORMAL LOW (ref 3.5–5.1)
Sodium: 141 mEq/L (ref 135–145)

## 2020-07-20 LAB — VITAMIN B12: Vitamin B-12: 372 pg/mL (ref 211–911)

## 2020-07-20 LAB — LIPID PANEL
Cholesterol: 138 mg/dL (ref 0–200)
HDL: 43.2 mg/dL (ref >39.00)
LDL Cholesterol: 79 mg/dL (ref 0–99)
NonHDL: 94.71
Total CHOL/HDL Ratio: 3
Triglycerides: 81 mg/dL (ref 0.0–149.0)
VLDL: 16.2 mg/dL (ref 0.0–40.0)

## 2020-07-20 LAB — TSH: TSH: 1.29 u[IU]/mL (ref 0.35–4.50)

## 2020-07-20 LAB — HEMOGLOBIN A1C: Hgb A1c MFr Bld: 6.1 % (ref 4.6–6.5)

## 2020-07-20 MED ORDER — PREDNISONE 10 MG PO TABS: ORAL_TABLET | ORAL | 0 refills | Status: DC

## 2020-07-20 MED ORDER — ATORVASTATIN CALCIUM 20 MG PO TABS: 20.0000 mg | ORAL_TABLET | Freq: Every day | ORAL | 3 refills | Status: DC

## 2020-07-20 MED ORDER — LOSARTAN POTASSIUM-HCTZ 100-25 MG PO TABS
1.0000 | ORAL_TABLET | Freq: Every day | ORAL | 3 refills | Status: DC
Start: 2020-07-20 — End: 2021-08-05

## 2020-07-20 MED ORDER — ELIQUIS 5 MG PO TABS: 1.0000 | ORAL_TABLET | Freq: Two times a day (BID) | ORAL | 5 refills | Status: DC

## 2020-07-20 MED ORDER — HYDROCOD POLST-CPM POLST ER 10-8 MG/5ML PO SUER: 5.0000 mL | Freq: Two times a day (BID) | ORAL | 0 refills | Status: DC | PRN

## 2020-07-20 MED ORDER — AZITHROMYCIN 250 MG PO TABS: ORAL_TABLET | ORAL | 0 refills | Status: DC

## 2020-07-20 MED ORDER — METOLAZONE 5 MG PO TABS: 5.0000 mg | ORAL_TABLET | Freq: Every day | ORAL | 0 refills | Status: DC

## 2020-07-20 MED ORDER — ALBUTEROL SULFATE HFA 108 (90 BASE) MCG/ACT IN AERS: INHALATION_SPRAY | RESPIRATORY_TRACT | 2 refills | Status: DC

## 2020-07-20 NOTE — Progress Notes (Signed)
[name], female    DOB: [birth year]      MRN: [id number]   Brief patient profile:  39 yobf quit smoking in 1990 / MO asthma since childhood remotely seen by Tia Masker on advair 250 one daily and freq albuterol and then R Plantar fascitis around march 2020 and then toe surgery both sides May 6th 2020 then more sob May 15th > admitted 07/05/2018   Note spirometry 11/2012 nl    Admit date: 07/05/2018 Discharge date: 07/07/2018   Discharge Diagnoses:      Pulmonary embolism ([place])    Extrinsic asthma   Hypertension   Elevated troponin    Brief/Interim Summary:  60 y.o. female with history of hypertension, hyperlipidemia, asthma previous history of alcohol abuse states she only drinks occasionally nowadays presented to the ER because of nausea vomiting with some epigastric discomfort and burning sensation in the chest over the last 3 days PTA.     just recently had both  great toe nails removed in her left great toe nailbed has become more discolored but no discharge or pain.   ED Course:   CT angiogram of the chest which shows bilateral subsegmental pulmonary embolism with no strain pattern.  Patient was started on heparin and admitted for further work-up.  Labs also showed elevated LFTs with AST of 70 ALT of 55 bilirubin was 1.7.  CT angiogram of the chest done for PE also showed persistent lesion in the liver which as per the radiologist was possible hemangioma.   Hospital course: Pulmonary embolism with no strain pattern per CT chest.  Echocardiogram was done consistent with no severe strain noted, was started on a heparin drip and converted over to Eliquis on discharge.  Dopplers were checked which were negative but TDS.    Elevated troponin; these were marginally elevated at 0.04 and 0.03 the setting of PE.  They remained flat, no acute ischemic changes on EKG.  Patient will follow-up with her PCP for continued outpatient monitoring and further coronary stratification with referral to  cardiology.      History of Present Illness  07/28/2018  Pulmonary/ 1st office eval/Alisi Lupien re-establish re chronic doe/new pe Chief Complaint  Patient presents with   Consult    Pulmonary Embolism-H/O asthma has not needed rescue inhaler.  Dyspnea:  MMRC1 = can walk nl pace, flat grade, can't hurry or go uphills or steps s sob   Cough: none Sleep: on back / 2 pillows  SABA use: none on advair one daily Re Call me if trouble getting the eliquis - you need to be on it for 6 full months Weight control is simply a matter of calorie balance    01/14/2019  f/u ov/Mayleen Borrero re: PE  Dx 06/2018 on eliquis, asthma much better  Chief Complaint  Patient presents with   Follow-up    Breathing is doing well today. She is using her proair inhaler 3 x per wk on average.   Dyspnea:  MMRC1 = can walk nl pace, flat grade, can't hurry or go uphills or steps s sob   Cough: none Sleeping: 2 pillows  SABA use: as above  rec Only use your albuterol as a rescue medication  GERD diet    03/02/2020  f/u ov/Gerardine Peltz re:  PE / dx  06/2018 and mild asthma on advair  hfa 115 one bid  Chief Complaint  Patient presents with   Follow-up    Breathing is overall doing well. She uses her albuterol inhaler  about once per day. She states she has cough in the am- non prod.   Dyspnea: Not limited by breathing from desired activities but very sedentary   Cough: some throat clearing / some worse in am's attributes to overt HB  Sleeping: flat bed pillows  SABA use: rarely 02: none   Rec Ok to renew your Eliquis and keep working on the weight loss  To get the most out of exercise, you need to be continuously aware that you are short of breath GERD diet /lifestyle     07/20/2020  f/u ov/Taim Wurm re: acute cough  Chief Complaint  Patient presents with   Acute Visit    Productive cough for 2 days with green/ yellow mucus    Dyspnea: no change mild doe Cough: acute onset 6/8 with yellow mucus, no fever, neg covid  test Sleeping: ok p rob dm SABA use: no increase  02: none    No obvious patterns in day to day or daytime variability or assoc   mucus plugs or hemoptysis or cp or chest tightness, subjective wheeze or overt sinus or hb symptoms.   Sleeping most nights  without nocturnal  or early am exacerbation  of respiratory  c/o's or need for noct saba. Also denies any obvious fluctuation of symptoms with weather or environmental changes or other aggravating or alleviating factors except as outlined above   No unusual exposure hx or h/o childhood pna/ asthma or knowledge of premature birth.  Current Allergies, Complete Past Medical History, Past Surgical History, Family History, and Social History were reviewed in [employer] record.  ROS  The following are not active complaints unless bolded Hoarseness, sore throat, dysphagia, dental problems, itching, sneezing,  nasal congestion or discharge of excess mucus or purulent secretions, ear ache,   fever, chills, sweats, unintended wt loss or wt gain, classically pleuritic or exertional cp,  orthopnea pnd or arm/hand swelling  or feet swelling, presyncope, palpitations, abdominal pain, anorexia, nausea, vomiting, diarrhea  or change in bowel habits or change in bladder habits, change in stools or change in urine, dysuria, hematuria,  rash, arthralgias, visual complaints, headache, numbness, weakness or ataxia or problems with walking or coordination,  change in mood or  memory.        Current Meds - - NOTE:   Unable to verify as accurately reflecting what pt takes  / also taking advair 250   Medication Sig   albuterol (VENTOLIN HFA) 108 (90 Base) MCG/ACT inhaler 2 puffs up to four times per day as needed   alum & mag hydroxide-simeth (MAALOX/MYLANTA) 200-200-20 MG/5ML suspension Take by mouth every 6 (six) hours as needed for indigestion or heartburn.   apixaban (ELIQUIS) 5 MG TABS tablet Take 1 tablet (5 mg total) by mouth 2 (two)  times daily.   atorvastatin (LIPITOR) 20 MG tablet Take 1 tablet (20 mg total) by mouth daily.   azithromycin (ZITHROMAX) 250 MG tablet Take 2 tablets first day then 1 tablet daily until finished.   calcium carbonate (TUMS - DOSED IN MG ELEMENTAL CALCIUM) 500 MG chewable tablet Chew 1 tablet by mouth daily.   chlorpheniramine-HYDROcodone (TUSSIONEX PENNKINETIC ER) 10-8 MG/5ML SUER Take 5 mLs by mouth every 12 (twelve) hours as needed for cough.   cimetidine (TAGAMET) 200 MG tablet Take 200 mg by mouth every other day.   KLOR-CON M20 20 MEQ tablet TAKE 1 TABLET BY MOUTH EVERY DAY   losartan-hydrochlorothiazide (HYZAAR) 100-25 MG tablet Take 1 tablet by mouth  daily.   metolazone (ZAROXOLYN) 5 MG tablet Take 1 tablet (5 mg total) by mouth daily.   metoprolol succinate (TOPROL XL) 25 MG 24 hr tablet Take 1 tablet (25 mg total) by mouth at bedtime. (Patient taking differently: Take 25 mg by mouth daily.)   ondansetron (ZOFRAN ODT) 4 MG disintegrating tablet Take 1 tablet (4 mg total) by mouth every 8 (eight) hours as needed for nausea or vomiting.   predniSONE (DELTASONE) 10 MG tablet 3 tabs by mouth per day for 3 days,2tabs per day for 3 days,1tab per day for 3 days   triamcinolone cream (KENALOG) 0.1 % APPLY TO AFFECTED AREA TWICE A DAY                 Past Medical History:  Diagnosis Date   Alcohol abuse    abuse- moderate years ago - states only drinks now on weekends -2 to 8 driinks    Asthma    COLONIC POLYPS, HX OF 04/05/2010   DIVERTICULITIS, HX OF 04/05/2010   DJD (degenerative joint disease)    right knee, mot to severe   GERD (gastroesophageal reflux disease)    no meds   Heart murmur    hx of    Hyperlipidemia    Hypertension    Impaired glucose tolerance 12/06/2013   Obesity (BMI 30-39.9)    PALPITATIONS, HX OF 09/14/2007       Objective:      07/20/2020       293   03/02/2020      300  01/14/19 290 lb (131.5 kg)  01/14/19 292 lb 9.6 oz (132.7 kg)  12/06/18 281 lb  9.6 oz (127.7 kg)    Vital signs reviewed  07/20/2020  - Note at rest 02 sats  98% on RA   General appearance:    obese amb bf hacking    HEENT : pt wearing mask not removed for exam due to covid -19 concerns.    NECK :  without JVD/Nodes/TM/ nl carotid upstrokes bilaterally   LUNGS: no acc muscle use,  Nl contour chest which is clear to A and P bilaterally without cough on insp or exp maneuvers   CV:  RRR  no s3 or murmur or increase in P2, and no edema   ABD:  obese soft and nontender with nl inspiratory excursion in the supine position. No bruits or organomegaly appreciated, bowel sounds nl  MS:  Nl gait/ ext warm without deformities, calf tenderness, cyanosis or clubbing No obvious joint restrictions   SKIN: warm and dry without lesions    NEURO:  alert, approp, nl sensorium with  no motor or cerebellar deficits apparent.        CXR PA and Lateral:   07/20/2020 :    I personally reviewed images and agree with radiology impression as follows:    1. Haziness in the left base could represent atelectasis and/or a small layering effusion. Infiltrate/pneumonia cannot be excluded on this study. Recommend short-term follow-up imaging to ensure resolution. 2. No other abnormalities. My impression:  Cardiomegaly with poor aeration LLL          Assessment

## 2020-07-20 NOTE — Progress Notes (Signed)
Chief Complaint:: wellness exam and htn, edema, cough. Allergies, asthma, hyperkalemia       HPI:  [name] is a 60 y.o. female here for wellness , declines covid booster #2 as had facial ash with booster #1; also declines pneumonia shot, and will check on shingles shot coverage; o'w up to date with preventive referrald immunizations                        Also no ETOH for 1 mo.  Could not tolerate toprol xl 50 mg before, now on 25 mg per cardiology.  Recent K elevated now holding K pill. Pt denies chest pain, increased sob or doe, wheezing, orthopnea, PND, increased LE swelling, palpitations, dizziness or syncope except for very mild leg swelling in the last 2 days, unusual for her, concerned about CHF  also, Does have several wks ongoing nasal allergy symptoms with clearish congestion, itch and sneezing as well as mild wheezing and sob,, without fever, pain, ST, swelling, but has also had cough she thinks may be due to post nasal gtt, worse at night to lie down.   Pt denies polydipsia, polyuria, or new focal neuro s/s.   Pt denies fever, wt loss, night sweats, loss of appetite, or other constitutional symptoms   no other new complaints  Denies worsening reflux, abd pain, dysphagia, n/v, bowel change or bloood. Denies worsening depressive symptoms, suicidal ideation, or panic   Wt Readings from Last 3 Encounters:  07/20/20 293 lb (132.9 kg)  07/20/20 294 lb (133.4 kg)  07/05/20 297 lb 9.6 oz (135 kg)   BP Readings from Last 3 Encounters:  07/20/20 132/82  07/20/20 (!) 152/88  07/13/20 (!) 176/99   Immunization History  Administered Date(s) Administered   PFIZER(Purple Top)SARS-COV-2 Vaccination 04/25/2019, 05/18/2019, 01/02/2020   Td 02/10/2002   Health Maintenance Due  Topic Date Due   Zoster Vaccines- Shingrix (1 of 2) Never done      Past Medical History:  Diagnosis Date   Abscess of bladder 07/28/2016   Alcohol abuse 07/28/2016   abuse- moderate years ago - states  only drinks now on weekends -2 to 8 driinks    Alcohol dependence ([place])    Allergic rhinitis 12/06/2013   Asthma    Asthma 03/16/2015   Atrial fibrillation with RVR ([place]) 04/01/2020   Atrial flutter with rapid ventricular response ([place]) 04/01/2020   COLONIC POLYPS, HX OF 04/05/2010   DIVERTICULITIS, HX OF 04/05/2010   DJD (degenerative joint disease)    right knee, mot to severe   GERD (gastroesophageal reflux disease)    no meds   Heart murmur    hx of    Hyperlipidemia    Hypertension    Impaired glucose tolerance 12/06/2013   Morbid obesity with BMI of 45.0-49.9, adult ([place])    Obesity, Class III, BMI 40-49.9 (morbid obesity) ([place]) 10/03/2014   PALPITATIONS, HX OF 09/14/2007   Past Surgical History:  Procedure Laterality Date   ABDOMINAL HYSTERECTOMY  age 15   fibroids   COLONOSCOPY WITH PROPOFOL N/A 07/25/2016   Procedure: COLONOSCOPY WITH PROPOFOL;  Surgeon: Carol Ada, MD;  Location: [facility];  Service: Endoscopy;  Laterality: N/A;   colonscopy     x 2   IR RADIOLOGIST EVAL & MGMT  08/12/2016   IR RADIOLOGIST EVAL & MGMT  08/21/2016   KNEE ARTHROSCOPY     left    TOTAL KNEE ARTHROPLASTY  07/29/2011  Procedure: TOTAL KNEE ARTHROPLASTY;  Surgeon: Mauri Pole, MD;  Location: [facility];  Service: Orthopedics;  Laterality: Right;   TOTAL KNEE ARTHROPLASTY Left 12/14/2012   Procedure: LEFT TOTAL KNEE ARTHROPLASTY;  Surgeon: Mauri Pole, MD;  Location: [facility];  Service: Orthopedics;  Laterality: Left;    reports that she quit smoking about 32 years ago. Her smoking use included cigarettes. She has a 3.50 pack-year smoking history. She has never used smokeless tobacco. She reports previous alcohol use. She reports previous drug use. Drug: Marijuana. family history includes COPD in her father; Lymphoma in her sister; Stroke in her mother. Allergies  Allergen Reactions   Morphine Itching   Shrimp [Shellfish Allergy] Itching and Other (See Comments)    Tongue burns also    Tramadol Other (See Comments)    Caused confusion   Diltiazem Hcl Itching    Pt with itching of the feet when bolus given   Other Other (See Comments)   Latex Rash   Current Outpatient Medications on File Prior to Visit  Medication Sig Dispense Refill   alum & mag hydroxide-simeth (MAALOX/MYLANTA) 200-200-20 MG/5ML suspension Take by mouth every 6 (six) hours as needed for indigestion or heartburn.     calcium carbonate (TUMS - DOSED IN MG ELEMENTAL CALCIUM) 500 MG chewable tablet Chew 1 tablet by mouth daily.     cimetidine (TAGAMET) 200 MG tablet Take 200 mg by mouth every other day.     KLOR-CON M20 20 MEQ tablet TAKE 1 TABLET BY MOUTH EVERY DAY 90 tablet 2   metoprolol succinate (TOPROL XL) 25 MG 24 hr tablet Take 1 tablet (25 mg total) by mouth at bedtime. (Patient taking differently: Take 25 mg by mouth daily.) 30 tablet 3   ondansetron (ZOFRAN ODT) 4 MG disintegrating tablet Take 1 tablet (4 mg total) by mouth every 8 (eight) hours as needed for nausea or vomiting. 20 tablet 0   triamcinolone cream (KENALOG) 0.1 % APPLY TO AFFECTED AREA TWICE A DAY 30 g 1   azithromycin (ZITHROMAX) 250 MG tablet Take 2 tablets first day then 1 tablet daily until finished. 6 tablet 0   No current facility-administered medications on file prior to visit.        ROS:  All others reviewed and negative.  Objective        PE:  BP (!) 152/88 (BP Location: Right Arm, Patient Position: Sitting, Cuff Size: Large)   Pulse 88   Temp 98.2 F (36.8 C) (Oral)   Ht 5\' 1"  (1.549 m)   Wt 294 lb (133.4 kg)   SpO2 96%   BMI 55.55 kg/m                 Constitutional: Pt appears in NAD               HENT: Head: NCAT.                Right Ear: External ear normal.                 Left Ear: External ear normal.  Bilat tm's with mild erythema.  Max sinus areas non tender.  Pharynx with mild erythema, no exudate               Eyes: . Pupils are equal, round, and reactive to light. Conjunctivae and EOM are normal                Nose: without d/c or deformity  Neck: Neck supple. Gross normal ROM               Cardiovascular: Normal rate and regular rhythm.                 Pulmonary/Chest: Effort normal and breath sounds without rales but with few bilat wheezing.                Abd:  Soft, NT, ND, + BS, no organomegaly               Neurological: Pt is alert. At baseline orientation, motor grossly intact               Skin: Skin is warm. No rashes, no other new lesions, LE edema - trace leg edema bilateral               Psychiatric: Pt behavior is normal without agitation   Micro: none  Cardiac tracings I have personally interpreted today:  none  Pertinent Radiological findings (summarize): none   Lab Results  Component Value Date   WBC 10.3 07/20/2020   HGB 14.8 07/20/2020   HCT 45.0 07/20/2020   PLT 209.0 07/20/2020   GLUCOSE 110 (H) 07/20/2020   CHOL 138 07/20/2020   TRIG 81.0 07/20/2020   HDL 43.20 07/20/2020   LDLDIRECT 107.0 02/23/2012   LDLCALC 79 07/20/2020   ALT 34 07/20/2020   AST 39 (H) 07/20/2020   NA 141 07/20/2020   K 3.0 (L) 07/20/2020   CL 99 07/20/2020   CREATININE 0.87 07/20/2020   BUN 13 07/20/2020   CO2 34 (H) 07/20/2020   TSH 1.29 07/20/2020   INR 1.11 07/28/2016   HGBA1C 6.1 07/20/2020   Assessment/Plan:  ANABIA WEATHERWAX is a 60 y.o. Black or African American [2] female with  has a past medical history of Abscess of bladder (07/28/2016), Alcohol abuse (07/28/2016), Alcohol dependence ([place]), Allergic rhinitis (12/06/2013), Asthma, Asthma (03/16/2015), Atrial fibrillation with RVR ([place]) (04/01/2020), Atrial flutter with rapid ventricular response ([place]) (04/01/2020), COLONIC POLYPS, HX OF (04/05/2010), DIVERTICULITIS, HX OF (04/05/2010), DJD (degenerative joint disease), GERD (gastroesophageal reflux disease), Heart murmur, Hyperlipidemia, Hypertension, Impaired glucose tolerance (12/06/2013), Morbid obesity with BMI of 45.0-49.9, adult ([place]), Obesity, Class III,  BMI 40-49.9 (morbid obesity) ([place]) (10/03/2014), and PALPITATIONS, HX OF (09/14/2007).  Encounter for well adult exam with abnormal findings Age and sex appropriate education and counseling updated with regular exercise and diet Referrals for preventative services - none needed Immunizations addressed - declines covid booster, pneumovax, shingrix Smoking counseling  - none needed Evidence for depression or other mood disorder - none significant Most recent labs reviewed. I have personally reviewed and have noted: 1) the patient's medical and social history 2) The patient's current medications and supplements 3) The patient's height, weight, and BMI have been recorded in the chart   Essential hypertension  BP Readings from Last 3 Encounters:  07/20/20 132/82  07/20/20 (!) 152/88  07/13/20 (!) 176/99   Stable, pt to continue medical treatment toprol, hyzaar   GERD Stable, cont current med tx - tagamet  Depression Stable overall, no need for further med tx or referral  Allergic rhinitis Stable, to continue otc zyrtec prn  Impaired glucose tolerance Lab Results  Component Value Date   HGBA1C 6.1 07/20/2020   Stable, pt to continue current medical treatment  - diet   Peripheral edema Mild worsening in last 2 days, for zoroxyln x 3 days only, check bnp,  to f/u any worsening symptoms  or concerns  Hyperlipidemia Lab Results  Component Value Date   LDLCALC 79 07/20/2020   Stable, pt to continue current statin lipitor 20   Asthma exacerbation Mild for predpac asd, cxr, tussionex, and continue ventolin prn  Hyperkalemia Mild for K f/u lab,  to f/u any worsening symptoms or concerns  Followup: Return in about 3 weeks (around 08/10/2020).  Cathlean Cower, MD 07/22/2020 11:57 AM [facility]

## 2020-07-20 NOTE — Patient Instructions (Addendum)
Zpak called into your pharmacy  Prilosec 20 mg otc take 30 min before bfast and supper until cough is better then ok to stop  No cough drops - use sugar free candy or lifesavers (not mint or methol products)   I will review your cxr and call if any other recommendations  Call us next week if not better  Add:  cxr will need f/u either here or with PCP

## 2020-07-20 NOTE — Patient Instructions (Addendum)
Please check with your insurance about the Shingrix shot  Please take all new medication as prescribed - the zaroxyln fluid pill for 3 days only, the prednisone, and the tussionex  Please continue all other medications as before, and refills have been done if requested.  Please have the pharmacy call with any other refills you may need.  Please continue your efforts at being more active, low cholesterol diet, and weight control.  You are otherwise up to date with prevention measures today.  Please keep your appointments with your specialists as you may have planned  Please go to the XRAY Department in the first floor for the x-ray testing  Please go to the LAB at the blood drawing area for the tests to be done  You will be contacted by phone if any changes need to be made immediately.  Otherwise, you will receive a letter about your results with an explanation, but please check with MyChart first.  Please remember to sign up for MyChart if you have not done so, as this will be important to you in the future with finding out test results, communicating by private email, and scheduling acute appointments online when needed.  Please make an Appointment to return in 3 weeks, or sooner if needed

## 2020-07-22 ENCOUNTER — Encounter: Payer: Self-pay | Admitting: Internal Medicine

## 2020-07-22 DIAGNOSIS — E875 Hyperkalemia: Secondary | ICD-10-CM | POA: Insufficient documentation

## 2020-07-22 DIAGNOSIS — J45901 Unspecified asthma with (acute) exacerbation: Secondary | ICD-10-CM | POA: Insufficient documentation

## 2020-07-22 NOTE — Assessment & Plan Note (Signed)
Stable, to continue otc zyrtec prn

## 2020-07-22 NOTE — Assessment & Plan Note (Signed)
  BP Readings from Last 3 Encounters:  07/20/20 132/82  07/20/20 (!) 152/88  07/13/20 (!) 176/99   Stable, pt to continue medical treatment toprol, hyzaar

## 2020-07-22 NOTE — Assessment & Plan Note (Signed)
Mild for predpac asd, cxr, tussionex, and continue ventolin prn

## 2020-07-22 NOTE — Assessment & Plan Note (Addendum)
Mild worsening in last 2 days, for zoroxyln x 3 days only, check bnp,  to f/u any worsening symptoms or concerns

## 2020-07-22 NOTE — Assessment & Plan Note (Signed)
Stable, cont current med tx - tagamet

## 2020-07-22 NOTE — Assessment & Plan Note (Signed)
Age and sex appropriate education and counseling updated with regular exercise and diet Referrals for preventative services - none needed Immunizations addressed - declines covid booster, pneumovax, shingrix Smoking counseling  - none needed Evidence for depression or other mood disorder - none significant Most recent labs reviewed. I have personally reviewed and have noted: 1) the patient's medical and social history 2) The patient's current medications and supplements 3) The patient's height, weight, and BMI have been recorded in the chart

## 2020-07-22 NOTE — Assessment & Plan Note (Signed)
Stable overall, no need for further med tx or referral

## 2020-07-22 NOTE — Assessment & Plan Note (Signed)
Lab Results  Component Value Date   LDLCALC 79 07/20/2020   Stable, pt to continue current statin lipitor 20

## 2020-07-22 NOTE — Assessment & Plan Note (Signed)
Mild for K f/u lab,  to f/u any worsening symptoms or concerns

## 2020-07-22 NOTE — Assessment & Plan Note (Signed)
Lab Results  Component Value Date   HGBA1C 6.1 07/20/2020   Stable, pt to continue current medical treatment  - diet

## 2020-07-24 ENCOUNTER — Other Ambulatory Visit: Payer: Self-pay | Admitting: Internal Medicine

## 2020-07-24 ENCOUNTER — Encounter: Payer: Self-pay | Admitting: Internal Medicine

## 2020-07-24 ENCOUNTER — Telehealth: Payer: Self-pay | Admitting: Internal Medicine

## 2020-07-24 DIAGNOSIS — R3129 Other microscopic hematuria: Secondary | ICD-10-CM

## 2020-07-24 NOTE — Assessment & Plan Note (Addendum)
Trial off advair and max gerd rx 10/02/2014    Flared on advair and off gerd rx > try zpak and max gerd rx and if still coughing return for f/u cxr as could have early pna on L   Though I doubt it.  Of the three most common causes of    Recurrent  cough, only one (GERD)  can actually contribute to/ trigger  the other two (asthma and post nasal drip syndrome)  and perpetuate the cylce of cough.  While not intuitively obvious, many patients with chronic low grade reflux do not cough until there is a primary insult that disturbs the protective epithelial barrier and exposes sensitive nerve endings.   This is typically viral but can due to PNDS and  either may apply here.   The point is that once this occurs, it is difficult to eliminate the cycle  using anything but a maximally effective acid suppression regimen at least in the short run, accompanied by an appropriate diet to address non acid GERD and control / eliminate the cough itself for at least 3 days with tussionex which she already has and zpak and hold off on further w/u for now - f/u with PCP if all better, if not return to this clinic with all meds in hand using a trust but verify approach to confirm accurate Medication  Reconciliation The principal here is that until we are certain that the  patients are doing what we've asked, it makes no sense to ask them to do more.

## 2020-07-24 NOTE — Telephone Encounter (Signed)
Patient notified that the Disability parking placard is completed.

## 2020-07-24 NOTE — Telephone Encounter (Signed)
Type of form received: Disability parking placard  Additional comments: patient wants to make sure that #3 is checked on the form: permanent disability, unable to walk no more than 200 feet because of asthma  Received by: Amy   Is patient requesting call for pickup: Yes, please call [phone] when ready   Form placed in the Provider's box.

## 2020-07-24 NOTE — Assessment & Plan Note (Signed)
Body mass index is 55.36 kg/m.  -  trending down slightly Lab Results  Component Value Date   TSH 1.29 07/20/2020     Contributing to gerd risk/ doe/reviewed the need and the process to achieve and maintain neg calorie balance > defer f/u primary care including intermittently monitoring thyroid status            Each maintenance medication was reviewed in detail including emphasizing most importantly the difference between maintenance and prns and under what circumstances the prns are to be triggered using an action plan format where appropriate.  Total time for H and P, chart review, counseling, reviewing inhaler device(s) and generating customized AVS unique to this acute  office visit / same day charting = 32 min

## 2020-07-28 ENCOUNTER — Ambulatory Visit
Admission: RE | Admit: 2020-07-28 | Discharge: 2020-07-28 | Disposition: A | Discharge status: Home / Self Care | Payer: BC Managed Care – PPO | Source: Ambulatory Visit | Attending: Internal Medicine | Admitting: Internal Medicine

## 2020-07-28 DIAGNOSIS — Z1231 Encounter for screening mammogram for malignant neoplasm of breast: Secondary | ICD-10-CM | POA: Diagnosis not present

## 2020-07-30 ENCOUNTER — Other Ambulatory Visit: Payer: Self-pay | Admitting: Internal Medicine

## 2020-07-30 ENCOUNTER — Telehealth: Payer: Self-pay | Admitting: *Deleted

## 2020-07-30 IMAGING — CR DG CHEST 2V
2 series · 2 of 2 positions shown · non-contrast
Comparison: 04/30/2017

CLINICAL DATA: Shortness of breath.  History of asthma.

EXAM:
CHEST - 2 VIEW

[chest pa]
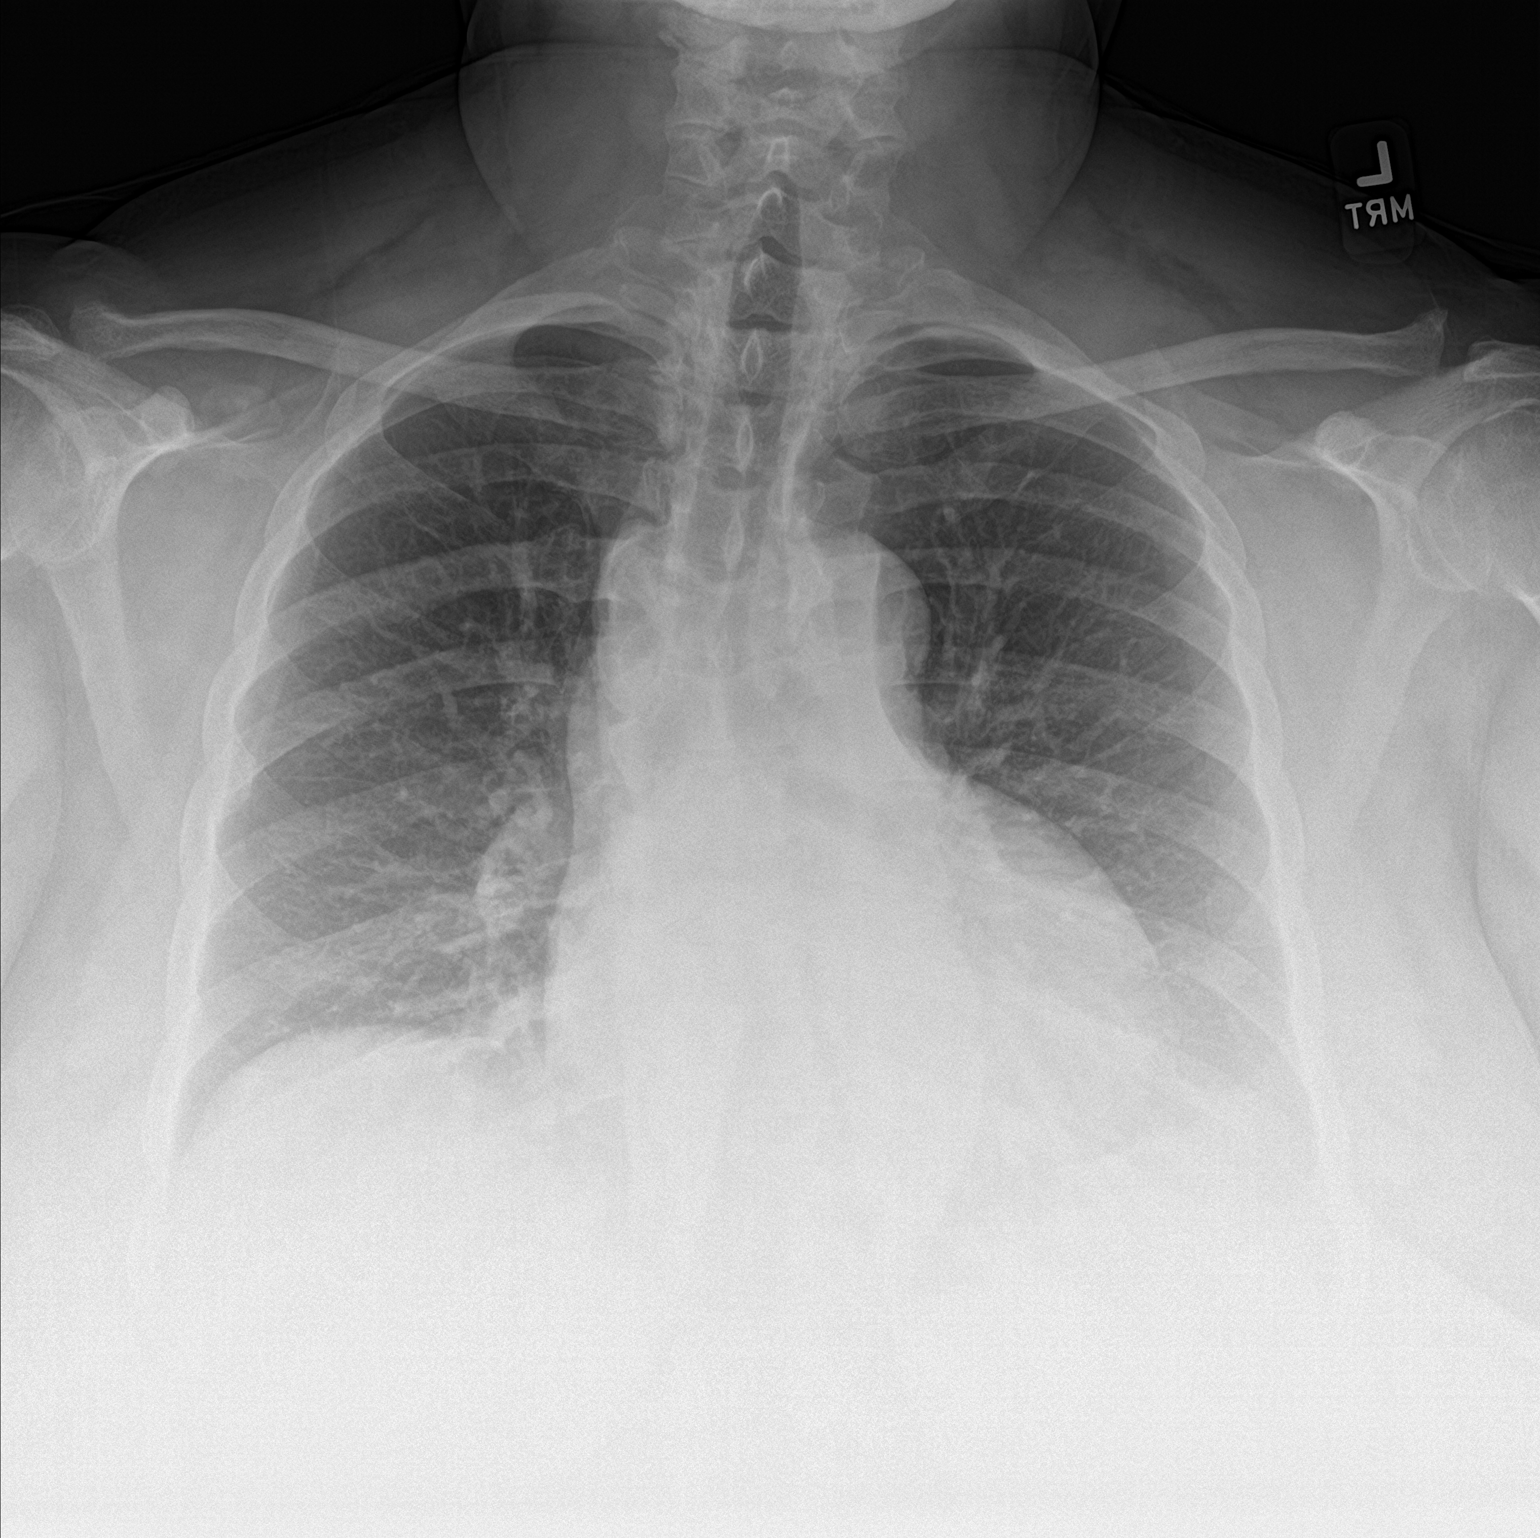

[chest lat]
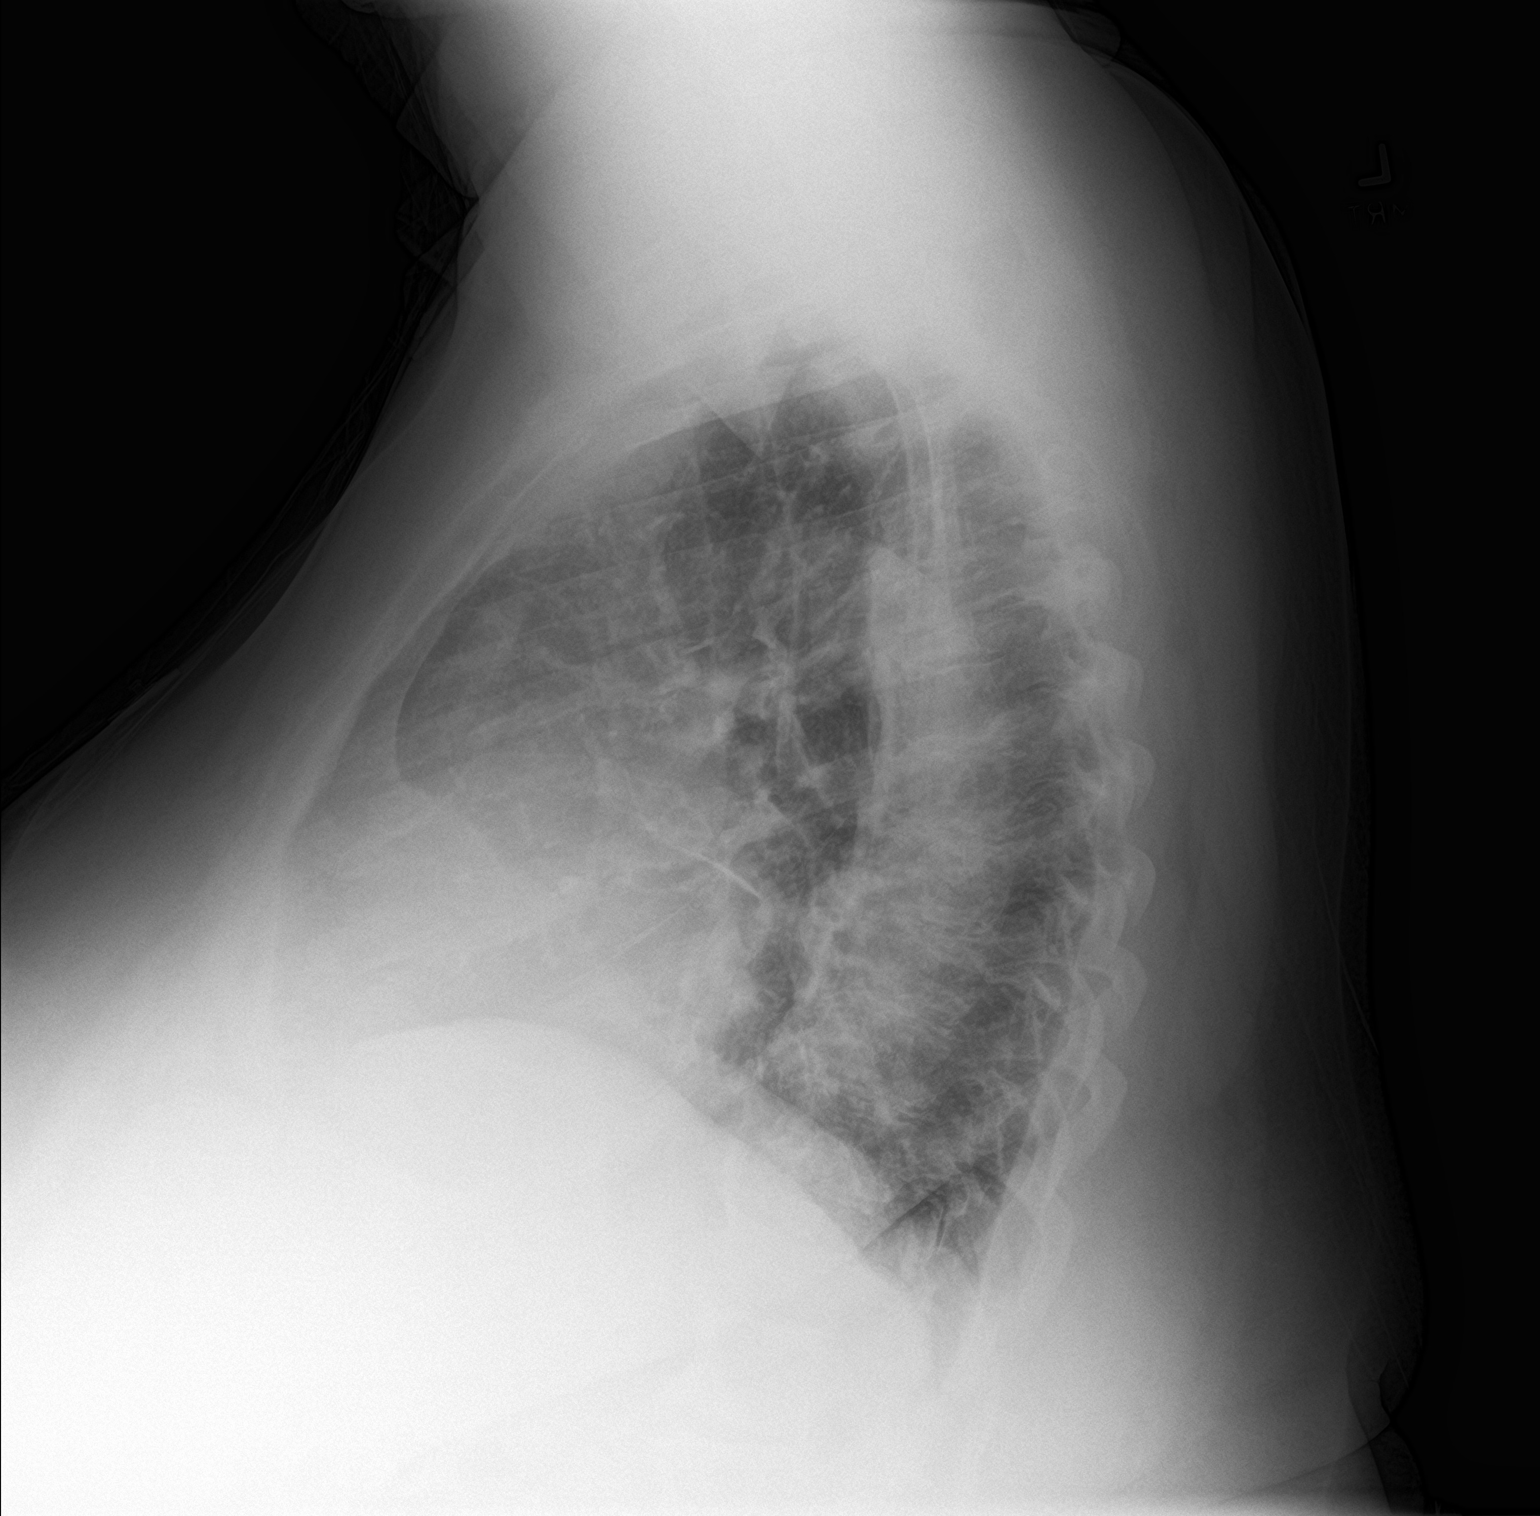

[2 of 2 positions shown; findings below may reference images not displayed]

FINDINGS: Heart size is at the upper limits of normal. Slight tortuosity of
the thoracic aorta. Pulmonary vascularity is normal. Lungs are
clear. No effusions.
IMPRESSION: No active cardiopulmonary disease.

## 2020-07-30 MED ORDER — POTASSIUM CHLORIDE CRYS ER 10 MEQ PO TBCR: 10.0000 meq | EXTENDED_RELEASE_TABLET | Freq: Two times a day (BID) | ORAL | 3 refills | Status: DC

## 2020-07-30 NOTE — Telephone Encounter (Signed)
-----   Message from Tanda Rockers, MD sent at 07/24/2020  8:18 AM EDT ----- Cxr shows poor aeration of LLL so needs f/u cxr in 4 weeks either here with NP or with her PCP

## 2020-07-30 NOTE — Telephone Encounter (Signed)
LMTCB

## 2020-07-31 NOTE — Telephone Encounter (Signed)
Called pt, she was at work- left msg with female who answered and he states will have her call us back later. Will keep open.

## 2020-08-01 ENCOUNTER — Other Ambulatory Visit: Payer: Self-pay | Admitting: Internal Medicine

## 2020-08-02 NOTE — Telephone Encounter (Signed)
Spoke with the pt and notified of response per Dr Wert  She verbalized understanding  Nothing further needed 

## 2020-08-02 NOTE — Telephone Encounter (Signed)
Lung is just not fully inflated so best bet is it's from not taking a full breath - really try hard and we'll address this further at ov if not improved

## 2020-08-02 NOTE — Telephone Encounter (Signed)
LMTCB this time on both cell and home number

## 2020-08-02 NOTE — Telephone Encounter (Signed)
Spoke with the pt and notified of results. I scheduled her appt for 09/03/20. She is worried about her cxr and wanted to know what you are thinking is wrong and what you are looking for on her next cxr. Please advise thanks.   (Pt states we can leave detailed msg on cell when we call her back)

## 2020-08-09 ENCOUNTER — Other Ambulatory Visit (HOSPITAL_COMMUNITY): Payer: Self-pay | Admitting: Nurse Practitioner

## 2020-08-24 ENCOUNTER — Ambulatory Visit: Payer: BC Managed Care – PPO

## 2020-09-03 ENCOUNTER — Ambulatory Visit (INDEPENDENT_AMBULATORY_CARE_PROVIDER_SITE_OTHER): Payer: Self-pay

## 2020-09-03 ENCOUNTER — Other Ambulatory Visit: Payer: Self-pay

## 2020-09-03 ENCOUNTER — Encounter: Payer: Self-pay | Admitting: Internal Medicine

## 2020-09-03 ENCOUNTER — Ambulatory Visit (INDEPENDENT_AMBULATORY_CARE_PROVIDER_SITE_OTHER): Payer: BC Managed Care – PPO | Admitting: Internal Medicine

## 2020-09-03 DIAGNOSIS — I517 Cardiomegaly: Secondary | ICD-10-CM | POA: Diagnosis not present

## 2020-09-03 DIAGNOSIS — R058 Other specified cough: Secondary | ICD-10-CM

## 2020-09-03 DIAGNOSIS — J9811 Atelectasis: Secondary | ICD-10-CM | POA: Diagnosis not present

## 2020-09-03 DIAGNOSIS — R059 Cough, unspecified: Secondary | ICD-10-CM | POA: Diagnosis not present

## 2020-09-03 NOTE — Patient Instructions (Addendum)
Please remember to go to the  x-ray department  for your tests - we will call you with the results when they are available    GERD (REFLUX)  is an extremely common cause of respiratory symptoms just like yours , many times with no obvious heartburn at all.    It can be treated with medication, but also with lifestyle changes including elevation of the head of your bed (ideally with 6-8inch blocks under the headboard of your bed),  Smoking cessation, avoidance of late meals, excessive alcohol, and avoid fatty foods, chocolate, peppermint, colas, red wine, and acidic juices such as orange juice.  NO MINT OR MENTHOL PRODUCTS SO NO COUGH DROPS  USE SUGARLESS CANDY INSTEAD (Jolley ranchers or Stover's or Life Savers) or even ice chips will also do - the key is to swallow to prevent all throat clearing. NO OIL BASED VITAMINS - use powdered substitutes.  Avoid fish oil when coughing.   Only use your albuterol as a rescue medication to be used if you can't catch your breath by resting or doing a relaxed purse lip breathing pattern.  - The less you use it, the better it will work when you need it. - Ok to use up to 2 puffs  every 4 hours if you must but call for immediate appointment if use goes up over your usual need - Don't leave home without it !!  (think of it like the spare tire for your car)   Ok to Try albuterol 15 min before an activity (on alternating days)  that you know would make you short of breath and see if it makes any difference and if makes none then don't take albuterol after activity unless you can't catch your breath as this means it's the resting that helps, not the albuterol.      Please schedule a follow up visit in 3 months but call sooner if needed  with all medications /inhalers/ solutions in hand so we can verify exactly what you are taking. This includes all medications from all doctors and over the counters

## 2020-09-03 NOTE — Progress Notes (Signed)
[name], female    DOB: [birth year]      MRN: [id number]   Brief patient profile:  39 yobf quit smoking in 1990 / [place] asthma since childhood remotely seen by Tia Masker on advair 250 one daily and freq albuterol and then R Plantar fascitis around march 2020 and then toe surgery both sides May 6th 2020 then more sob May 15th > admitted 07/05/2018   Note spirometry 11/2012 nl    Admit date: 07/05/2018 Discharge date: 07/07/2018   Discharge Diagnoses:      Pulmonary embolism ([place])    Extrinsic asthma   Hypertension   Elevated troponin    Brief/Interim Summary:  60 y.o. female with history of hypertension, hyperlipidemia, asthma previous history of alcohol abuse states she only drinks occasionally nowadays presented to the ER because of nausea vomiting with some epigastric discomfort and burning sensation in the chest over the last 3 days PTA.     just recently had both  great toe nails removed in her left great toe nailbed has become more discolored but no discharge or pain.   ED Course:   CT angiogram of the chest which shows bilateral subsegmental pulmonary embolism with no strain pattern.  Patient was started on heparin and admitted for further work-up.  Labs also showed elevated LFTs with AST of 70 ALT of 55 bilirubin was 1.7.  CT angiogram of the chest done for PE also showed persistent lesion in the liver which as per the radiologist was possible hemangioma.   Hospital course: Pulmonary embolism with no strain pattern per CT chest.  Echocardiogram was done consistent with no severe strain noted, was started on a heparin drip and converted over to Eliquis on discharge.  Dopplers were checked which were negative but TDS.    Elevated troponin; these were marginally elevated at 0.04 and 0.03 the setting of PE.  They remained flat, no acute ischemic changes on EKG.  Patient will follow-up with her PCP for continued outpatient monitoring and further coronary stratification with referral to  cardiology.      History of Present Illness  07/28/2018  Pulmonary/ 1st office eval/Promise Bushong re-establish re chronic doe/new pe Chief Complaint  Patient presents with   Consult    Pulmonary Embolism-H/O asthma has not needed rescue inhaler.  Dyspnea:  MMRC1 = can walk nl pace, flat grade, can't hurry or go uphills or steps s sob   Cough: none Sleep: on back / 2 pillows  SABA use: none on advair one daily Re Call me if trouble getting the eliquis - you need to be on it for 6 full months Weight control is simply a matter of calorie balance    01/14/2019  f/u ov/Jahmel Flannagan re: PE  Dx 06/2018 on eliquis, asthma much better  Chief Complaint  Patient presents with   Follow-up    Breathing is doing well today. She is using her proair inhaler 3 x per wk on average.   Dyspnea:  MMRC1 = can walk nl pace, flat grade, can't hurry or go uphills or steps s sob   Cough: none Sleeping: 2 pillows  SABA use: as above  rec Only use your albuterol as a rescue medication  GERD diet    03/02/2020  f/u ov/Jartavious Mckimmy re:  PE / dx  06/2018 and mild asthma on advair  hfa 115 one bid  Chief Complaint  Patient presents with   Follow-up    Breathing is overall doing well. She uses her albuterol inhaler  about once per day. She states she has cough in the am- non prod.   Dyspnea: Not limited by breathing from desired activities but very sedentary   Cough: some throat clearing / some worse in am's attributes to overt HB  Sleeping: flat bed pillows  SABA use: rarely 02: none   Rec Ok to renew your Eliquis and keep working on the weight loss  To get the most out of exercise, you need to be continuously aware that you are short of breath GERD diet /lifestyle     07/20/2020  f/u ov/Mizraim Harmening re: acute cough  Chief Complaint  Patient presents with   Acute Visit    Productive cough for 2 days with green/ yellow mucus   Dyspnea: no change mild doe Cough: acute onset 6/8 with yellow mucus, no fever, neg covid test Sleeping:  ok p rob dm SABA use: no increase  02: none  Rec Zpak called into your pharmacy Prilosec 20 mg otc take 30 min before bfast and supper until cough is better then ok to stop No cough drops - use sugar free candy or lifesavers (not mint or methol products)  I will review your cxr and call if any other recommendations Call us next week if not better  Add:  cxr will need f/u either here or with PCP    09/03/2020  f/u ov/Caldwell Kronenberger re: mild asthma on advair 115  Chief Complaint  Patient presents with   Follow-up    1 month, patient does not have any concerns,    Dyspnea:  able to shop but occ riding cart  Cough: gone  Sleeping: bed flat 3 pillows / sometimes gerd  SABA use: prn doe and typically  each  am "barely using one inhaler a month" 02: none  Covid status:   vax x 3    No obvious day to day or daytime variability or assoc excess/ purulent sputum or mucus plugs or hemoptysis or cp or chest tightness, subjective wheeze or overt sinus or hb symptoms.   Sleeping as above  without nocturnal  or early am exacerbation  of respiratory  c/o's or need for noct saba. Also denies any obvious fluctuation of symptoms with weather or environmental changes or other aggravating or alleviating factors except as outlined above   No unusual exposure hx or h/o childhood pna/ asthma or knowledge of premature birth.  Current Allergies, Complete Past Medical History, Past Surgical History, Family History, and Social History were reviewed in [employer] record.  ROS  The following are not active complaints unless bolded Hoarseness, sore throat, dysphagia, dental problems, itching, sneezing,  nasal congestion or discharge of excess mucus or purulent secretions, ear ache,   fever, chills, sweats, unintended wt loss or wt gain, classically pleuritic or exertional cp,  orthopnea pnd or arm/hand swelling  or leg swelling, presyncope, palpitations, abdominal pain, anorexia, nausea, vomiting,  diarrhea  or change in bowel habits or change in bladder habits, change in stools or change in urine, dysuria, hematuria,  rash, arthralgias, visual complaints, headache, numbness, weakness or ataxia or problems with walking or coordination,  change in mood or  memory.        Current Meds -  - NOTE:   Unable to verify as accurately reflecting what pt takes    Medication Sig   albuterol (VENTOLIN HFA) 108 (90 Base) MCG/ACT inhaler 2 puffs up to four times per day as needed   alum & mag hydroxide-simeth (MAALOX/MYLANTA) 200-200-20 MG/5ML  suspension Take by mouth every 6 (six) hours as needed for indigestion or heartburn.   apixaban (ELIQUIS) 5 MG TABS tablet Take 1 tablet (5 mg total) by mouth 2 (two) times daily.   atorvastatin (LIPITOR) 20 MG tablet Take 1 tablet (20 mg total) by mouth daily.   cimetidine (TAGAMET) 200 MG tablet Take 200 mg by mouth every other day.   losartan-hydrochlorothiazide (HYZAAR) 100-25 MG tablet Take 1 tablet by mouth daily.   metoprolol succinate (TOPROL-XL) 25 MG 24 hr tablet TAKE 1 TABLET BY MOUTH EVERYDAY AT BEDTIME   ondansetron (ZOFRAN ODT) 4 MG disintegrating tablet Take 1 tablet (4 mg total) by mouth every 8 (eight) hours as needed for nausea or vomiting.   potassium chloride (KLOR-CON) 10 MEQ tablet Take 1 tablet (10 mEq total) by mouth 2 (two) times daily.   triamcinolone cream (KENALOG) 0.1 % APPLY TO AFFECTED AREA TWICE A DAY     Advair 115  "has 6 m free supply"  2bid                 Past Medical History:  Diagnosis Date   Alcohol abuse    abuse- moderate years ago - states only drinks now on weekends -2 to 8 driinks    Asthma    COLONIC POLYPS, HX OF 04/05/2010   DIVERTICULITIS, HX OF 04/05/2010   DJD (degenerative joint disease)    right knee, mot to severe   GERD (gastroesophageal reflux disease)    no meds   Heart murmur    hx of    Hyperlipidemia    Hypertension    Impaired glucose tolerance 12/06/2013   Obesity (BMI 30-39.9)     PALPITATIONS, HX OF 09/14/2007       Objective:     09/03/2020       293  07/20/2020       293   03/02/2020      300  01/14/19 290 lb (131.5 kg)  01/14/19 292 lb 9.6 oz (132.7 kg)  12/06/18 281 lb 9.6 oz (127.7 kg)     Vital signs reviewed  09/03/2020  - Note at rest 02 sats  100% on RA   General appearance:    obese pleasant amb bf nad    HEENT : pt wearing mask not removed for exam due to covid -19 concerns.    NECK :  without JVD/Nodes/TM/ nl carotid upstrokes bilaterally   LUNGS: no acc muscle use,  Nl contour chest which is clear to A and P bilaterally without cough on insp or exp maneuvers   CV:  RRR  no s3 or murmur or increase in P2, and no edema   ABD:  quite obese but soft and nontender with nl inspiratory excursion in the supine position. No bruits or organomegaly appreciated, bowel sounds nl  MS:  Nl gait/ ext warm without deformities, calf tenderness, cyanosis or clubbing No obvious joint restrictions   SKIN: warm and dry without lesions    NEURO:  alert, approp, nl sensorium with  no motor or cerebellar deficits apparent.         CXR PA and Lateral:   09/03/2020 :    I personally reviewed images and agree with radiology impression as follows:    Cardiomegaly. Bronchitic changes, bibasilar atelectasis.        Assessment

## 2020-09-04 ENCOUNTER — Telehealth: Payer: Self-pay | Admitting: Internal Medicine

## 2020-09-04 ENCOUNTER — Encounter: Payer: Self-pay | Admitting: Internal Medicine

## 2020-09-04 NOTE — Progress Notes (Signed)
Tried calling pt and there was no answer, LMTCB

## 2020-09-04 NOTE — Assessment & Plan Note (Signed)
Trial off advair and max gerd rx 10/02/2014  - back on advair 115 hfa 09/03/2020 > has 6 m free samples so ok to continue   Not really sure she has asthma but if she does she's breaking the rule of 2s as regards to saba use.   Contingencies discussed in full including contacting this office immediately if not controlling the symptoms using the rule of two's as the goal.    Needs to continue max rx for gerd for uacs and understand approp use of saba:  I spent extra time with pt today reviewing appropriate use of albuterol for prn use on exertion with the following points: 1) saba is for relief of sob that does not improve by walking a slower pace or resting but rather if the pt does not improve after trying this first. 2) If the pt is convinced, as many are, that saba helps recover from activity faster then it's easy to tell if this is the case by re-challenging : ie stop, take the inhaler, then p 5 minutes try the exact same activity (intensity of workload) that just caused the symptoms and see if they are substantially diminished or not after saba 3) if there is an activity that reproducibly causes the symptoms, try the saba 15 min before the activity on alternate days   If in fact the saba really does help, then fine to continue to use it prn but advised may need to look closer at the maintenance regimen being used to achieve better control of airways disease with exertion.

## 2020-09-04 NOTE — Telephone Encounter (Signed)
Call patient :  Study is unremarkable except for crowding of markings at thebottoms due to wt issues, no treatment other than wt loss needed  I have called the pt and LM on VM in detail about her cxr results.  Pt requested that this be done.  Nothing further is needed.

## 2020-09-04 NOTE — Assessment & Plan Note (Signed)
Body mass index is 55.36 kg/m.  -  trending no change  Lab Results  Component Value Date   TSH 1.29 07/20/2020     Contributing to doe and risk of GERD >>>   reviewed the need and the process to achieve and maintain neg calorie balance > defer f/u primary care including intermittently monitoring thyroid status           Each maintenance medication was reviewed in detail including emphasizing most importantly the difference between maintenance and prns and under what circumstances the prns are to be triggered using an action plan format where appropriate.  Total time for H and P, chart review, counseling, reviewing hfa device(s) and generating customized AVS unique to this office visit / same day charting = 25 min

## 2020-09-05 ENCOUNTER — Telehealth: Payer: Self-pay | Admitting: Internal Medicine

## 2020-09-05 NOTE — Telephone Encounter (Signed)
Pt is returning phone call in regards to her chest x-ray results; she is wanting to know if she has a collapsed lung. Pls regard; [phone] **Pt wants nurse to leave a detailed message about her question stated she is at work**

## 2020-09-05 NOTE — Telephone Encounter (Signed)
Left message for patient to return call.

## 2020-09-05 NOTE — Telephone Encounter (Signed)
Patient informed of results and recommendations

## 2020-09-07 ENCOUNTER — Other Ambulatory Visit: Payer: Self-pay

## 2020-09-07 ENCOUNTER — Ambulatory Visit (INDEPENDENT_AMBULATORY_CARE_PROVIDER_SITE_OTHER): Payer: BC Managed Care – PPO | Admitting: Podiatry

## 2020-09-07 ENCOUNTER — Encounter: Payer: Self-pay | Admitting: Podiatry

## 2020-09-07 DIAGNOSIS — L700 Acne vulgaris: Secondary | ICD-10-CM | POA: Diagnosis not present

## 2020-09-07 DIAGNOSIS — B351 Tinea unguium: Secondary | ICD-10-CM

## 2020-09-07 DIAGNOSIS — M2041 Other hammer toe(s) (acquired), right foot: Secondary | ICD-10-CM

## 2020-09-07 DIAGNOSIS — M79609 Pain in unspecified limb: Secondary | ICD-10-CM

## 2020-09-07 DIAGNOSIS — L720 Epidermal cyst: Secondary | ICD-10-CM | POA: Diagnosis not present

## 2020-09-07 NOTE — Progress Notes (Signed)
Subjective:   Patient ID: [name], female   DOB: 60 y.o.   MRN: [id number]   HPI Patient presents with several concerns being corns nails and pain in the right fifth toe with lesion formation.  States that she has trouble at times wearing her shoes with big toe left foot   ROS      Objective:  Physical Exam  Neurovascular status intact with numerous different problems 1 being nail chronic disease bilateral second B keratotic lesion fifth digit right and lesion distal aspect hallux left     Assessment:  Numerous problems with hammertoe deformity lesion formation and nail disease     Plan:  H&P reviewed all problems separately debrided lesions debrided nails applied padding left hallux right fifth toe and instructed arthroplasty which may be necessary at 1 point in future.  Reappoint as symptoms indicate with education rendered today on all conditions

## 2020-10-05 ENCOUNTER — Telehealth: Payer: Self-pay | Admitting: Internal Medicine

## 2020-10-05 NOTE — Telephone Encounter (Signed)
I called and and spoke with patient regarding Eliquis. Looks like the provider who prescribed is PCP so informed patient to contact their office. Patient verbalized understanding, nothing further needed.

## 2020-10-05 NOTE — Telephone Encounter (Signed)
Patient misplaced disability placard form filled out by provider in June  Patient is requesting another form be filled out'  Please notify patient when new form is completed

## 2020-10-09 DIAGNOSIS — Z0279 Encounter for issue of other medical certificate: Secondary | ICD-10-CM

## 2020-10-09 NOTE — Telephone Encounter (Signed)
Type of form received: Handicap Placard  Received by: Printed out by patient request in phone encounter  Form should be Faxed to: n/a  Form should be mailed to: n/a  Is patient requesting call for pickup: Yes   Form placed in the Provider's box on 10/09/20  Charge sheet will be attached.  Was patient informed of  7-10 business day turn around (Y/N)?

## 2020-10-10 ENCOUNTER — Encounter: Payer: Self-pay | Admitting: Internal Medicine

## 2020-10-10 NOTE — Telephone Encounter (Signed)
Please clarify - does she mean that she wants to change eliquis due to high cost?

## 2020-10-10 NOTE — Telephone Encounter (Signed)
Patient calling back to check status of handicap placard form  Advised patient someone from office will contact her when form is completed & ready for pick up

## 2020-10-11 ENCOUNTER — Telehealth: Payer: Self-pay | Admitting: Internal Medicine

## 2020-10-11 MED ORDER — RIVAROXABAN 20 MG PO TABS: 20.0000 mg | ORAL_TABLET | Freq: Every day | ORAL | 3 refills | Status: DC

## 2020-10-11 MED ORDER — ALBUTEROL SULFATE HFA 108 (90 BASE) MCG/ACT IN AERS: INHALATION_SPRAY | RESPIRATORY_TRACT | 2 refills | Status: DC

## 2020-10-11 NOTE — Telephone Encounter (Signed)
Patient requesting refill for  albuterol (VENTOLIN HFA) 108 (90 Base) MCG/ACT inhaler ondansetron (ZOFRAN ODT) 4 MG disintegrating tablet  [id number] [place] [facility] [address]

## 2020-10-16 ENCOUNTER — Telehealth: Payer: Self-pay

## 2020-10-16 ENCOUNTER — Telehealth: Payer: Self-pay | Admitting: Internal Medicine

## 2020-10-16 NOTE — Telephone Encounter (Signed)
Spoke to patient, who stated that Eliquis is not covered by insurance.  She stated that Dr. Melvyn Novas wanted to take her off of it but she wanted to continue it. According to our records PCP prescribed Xarelto on 10/11/2020.  She is wanting Dr. Gustavus Bryant thoughts. Should she continue or d/c?  Dr. Melvyn Novas, please advise. Thanks

## 2020-10-16 NOTE — Telephone Encounter (Signed)
Patient states that Xarelto $1500 which is more than the Eliquis ($500). Please advise.  Patient would also like a refill of Zofran.

## 2020-10-17 ENCOUNTER — Telehealth: Payer: Self-pay | Admitting: Internal Medicine

## 2020-10-17 MED ORDER — ONDANSETRON 4 MG PO TBDP: 4.0000 mg | ORAL_TABLET | Freq: Three times a day (TID) | ORAL | 2 refills | Status: DC | PRN

## 2020-10-17 MED ORDER — APIXABAN 5 MG PO TABS: 5.0000 mg | ORAL_TABLET | Freq: Two times a day (BID) | ORAL | 3 refills | Status: DC

## 2020-10-17 NOTE — Telephone Encounter (Signed)
ATC, left VM. 

## 2020-10-17 NOTE — Telephone Encounter (Signed)
Ok since there was no question in this note, I am guessing she wants to go back to the [facility] for zofran

## 2020-10-17 NOTE — Telephone Encounter (Signed)
1.Medication Requested: ondansetron (ZOFRAN ODT) 4 MG disintegrating tablet  2. Pharmacy (Name, Street, [place]):  [id number] [place] [place]  3. On Med List: yes  4. Last Visit with PCP: 06.10.22  5. Next visit date with PCP: n/a   Agent: Please be advised that RX refills may take up to 3 business days. We ask that you follow-up with your pharmacy.

## 2020-10-18 NOTE — Telephone Encounter (Signed)
There is the option of referral to Coumadin clinic, if pt is ok with at least monthly INR blood tests

## 2020-10-18 NOTE — Telephone Encounter (Signed)
I called and spoke with patient regarding message. I went over MW recs with patient. Patient verbalized understanding, still feels like Dr. Melvyn [name] was prescribing Eliquis but chart states PCP is. Patient stated Xarelto is also too expensive so waiting to hear back on that. Patient is going to call PCP to discuss blood thinner. Nothing further needed.

## 2020-10-18 NOTE — Telephone Encounter (Signed)
Patient wants to know what else can be prescribed.

## 2020-10-18 NOTE — Telephone Encounter (Signed)
I am not sure where this would be filed in her chart  [place] for Verdis Frederickson to check the chart to see if any copy of disability forms signed recently are present on the chart, thanks

## 2020-10-18 NOTE — Telephone Encounter (Signed)
Called pt and no answer- LMTCB

## 2020-10-22 NOTE — Telephone Encounter (Signed)
Completed form and placed on providers desk.

## 2020-10-22 NOTE — Telephone Encounter (Signed)
Left message for patient to call me back. 

## 2020-11-02 ENCOUNTER — Encounter (HOSPITAL_COMMUNITY): Payer: Self-pay | Admitting: Emergency Medicine

## 2020-11-02 ENCOUNTER — Emergency Department (HOSPITAL_COMMUNITY)
Admission: EM | Admit: 2020-11-02 | Discharge: 2020-11-03 | Disposition: A | Discharge status: Home / Self Care | Payer: Self-pay | Attending: Emergency Medicine | Admitting: Emergency Medicine

## 2020-11-02 ENCOUNTER — Other Ambulatory Visit: Payer: Self-pay

## 2020-11-02 DIAGNOSIS — Z96653 Presence of artificial knee joint, bilateral: Secondary | ICD-10-CM | POA: Insufficient documentation

## 2020-11-02 DIAGNOSIS — Z20822 Contact with and (suspected) exposure to covid-19: Secondary | ICD-10-CM | POA: Insufficient documentation

## 2020-11-02 DIAGNOSIS — J45909 Unspecified asthma, uncomplicated: Secondary | ICD-10-CM | POA: Insufficient documentation

## 2020-11-02 DIAGNOSIS — Z91040 Latex allergy status: Secondary | ICD-10-CM | POA: Insufficient documentation

## 2020-11-02 DIAGNOSIS — Z8616 Personal history of COVID-19: Secondary | ICD-10-CM | POA: Insufficient documentation

## 2020-11-02 DIAGNOSIS — Z87891 Personal history of nicotine dependence: Secondary | ICD-10-CM | POA: Insufficient documentation

## 2020-11-02 DIAGNOSIS — R519 Headache, unspecified: Secondary | ICD-10-CM | POA: Insufficient documentation

## 2020-11-02 DIAGNOSIS — I10 Essential (primary) hypertension: Secondary | ICD-10-CM | POA: Insufficient documentation

## 2020-11-02 DIAGNOSIS — Z7901 Long term (current) use of anticoagulants: Secondary | ICD-10-CM | POA: Insufficient documentation

## 2020-11-02 DIAGNOSIS — Z79899 Other long term (current) drug therapy: Secondary | ICD-10-CM | POA: Insufficient documentation

## 2020-11-02 DIAGNOSIS — R0602 Shortness of breath: Secondary | ICD-10-CM | POA: Insufficient documentation

## 2020-11-02 LAB — CBC WITH DIFFERENTIAL/PLATELET
Abs Immature Granulocytes: 0.08 10*3/uL — ABNORMAL HIGH (ref 0.00–0.07)
Basophils Absolute: 0.1 10*3/uL (ref 0.0–0.1)
Basophils Relative: 0 %
Eosinophils Absolute: 0 10*3/uL (ref 0.0–0.5)
Eosinophils Relative: 0 %
HCT: 45.1 % (ref 36.0–46.0)
Hemoglobin: 14.6 g/dL (ref 12.0–15.0)
Immature Granulocytes: 1 %
Lymphocytes Relative: 3 %
Lymphs Abs: 0.5 10*3/uL — ABNORMAL LOW (ref 0.7–4.0)
MCH: 30.8 pg (ref 26.0–34.0)
MCHC: 32.4 g/dL (ref 30.0–36.0)
MCV: 95.1 fL (ref 80.0–100.0)
Monocytes Absolute: 0.1 10*3/uL (ref 0.1–1.0)
Monocytes Relative: 0 %
Neutro Abs: 15.8 10*3/uL — ABNORMAL HIGH (ref 1.7–7.7)
Neutrophils Relative %: 96 %
Platelets: 218 10*3/uL (ref 150–400)
RBC: 4.74 MIL/uL (ref 3.87–5.11)
RDW: 14.6 % (ref 11.5–15.5)
WBC: 16.5 10*3/uL — ABNORMAL HIGH (ref 4.0–10.5)
nRBC: 0.1 % (ref 0.0–0.2)

## 2020-11-02 LAB — URINALYSIS, ROUTINE W REFLEX MICROSCOPIC
Bilirubin Urine: NEGATIVE
Glucose, UA: NEGATIVE mg/dL
Hgb urine dipstick: NEGATIVE
Ketones, ur: NEGATIVE mg/dL
Leukocytes,Ua: NEGATIVE
Nitrite: NEGATIVE
Protein, ur: NEGATIVE mg/dL
Specific Gravity, Urine: 1.027 (ref 1.005–1.030)
pH: 5 (ref 5.0–8.0)

## 2020-11-02 LAB — COMPREHENSIVE METABOLIC PANEL
ALT: 41 U/L (ref 0–44)
AST: 45 U/L — ABNORMAL HIGH (ref 15–41)
Albumin: 3.4 g/dL — ABNORMAL LOW (ref 3.5–5.0)
Alkaline Phosphatase: 101 U/L (ref 38–126)
Anion gap: 13 (ref 5–15)
BUN: 22 mg/dL — ABNORMAL HIGH (ref 6–20)
CO2: 24 mmol/L (ref 22–32)
Calcium: 9.3 mg/dL (ref 8.9–10.3)
Chloride: 103 mmol/L (ref 98–111)
Creatinine, Ser: 1.08 mg/dL — ABNORMAL HIGH (ref 0.44–1.00)
GFR, Estimated: 59 mL/min — ABNORMAL LOW (ref >60)
Glucose, Bld: 85 mg/dL (ref 70–99)
Potassium: 3.4 mmol/L — ABNORMAL LOW (ref 3.5–5.1)
Sodium: 140 mmol/L (ref 135–145)
Total Bilirubin: 1.1 mg/dL (ref 0.3–1.2)
Total Protein: 7.2 g/dL (ref 6.5–8.1)

## 2020-11-02 LAB — RESP PANEL BY RT-PCR (FLU A&B, COVID) ARPGX2
Influenza A by PCR: NEGATIVE
Influenza B by PCR: NEGATIVE
SARS Coronavirus 2 by RT PCR: NEGATIVE

## 2020-11-02 LAB — LIPASE, BLOOD: Lipase: 24 U/L (ref 11–51)

## 2020-11-02 MED ORDER — METOCLOPRAMIDE HCL 10 MG PO TABS
5.0000 mg | ORAL_TABLET | Freq: Once | ORAL | Status: DC
  Filled 2020-11-02: qty 1

## 2020-11-02 MED ORDER — ONDANSETRON 4 MG PO TBDP
4.0000 mg | ORAL_TABLET | Freq: Once | ORAL | Status: AC
  Administered 2020-11-02: 4 mg via ORAL
  Filled 2020-11-02: qty 1

## 2020-11-02 NOTE — ED Triage Notes (Signed)
Pt c/o SOB, malaise, n/v and non productive cough.  Pt is concerned about blood clots, is on elequix however her insurance stopped paying for it and she takes her last one today.

## 2020-11-02 NOTE — ED Provider Notes (Signed)
Emergency Medicine Provider Triage Evaluation Note  [name] , a 60 y.o. female  was evaluated in triage.  Pt complains of shortness of breath, malaise, several symptoms of emesis and vague abdominal pain which darted 2 days ago.  Patient states she has a history of PEs and is on Eliquis, though her insurance has stopped covering and she took her last pill today.  She is vaccinated but not boosted for COVID-19.  No known sick contacts.  Cough is nonproductive.  Denies any chest pain.  Review of Systems  Positive: As above Negative: As above  Physical Exam  BP (!) 164/99 (BP Location: Right Arm)   Pulse (!) 121   Temp 99.9 F (37.7 C)   Resp (!) 26   SpO2 98%  Gen:   Awake, no distress   Resp:  Normal effort, Lung sounds equal and clear  MSK:   Moves extremities without difficulty  Other:    Medical Decision Making  Medically screening exam initiated at 7:10 PM.  Appropriate orders placed.  [name] was informed that the remainder of the evaluation will be completed by another provider, this initial triage assessment does not replace that evaluation, and the importance of remaining in the ED until their evaluation is complete.     Lyndel Safe 11/02/20 1912    Carmin Muskrat, MD 11/02/20 [id number]

## 2020-11-03 ENCOUNTER — Emergency Department (HOSPITAL_COMMUNITY): Payer: Self-pay

## 2020-11-03 MED ORDER — DIPHENHYDRAMINE HCL 50 MG/ML IJ SOLN
25.0000 mg | Freq: Once | INTRAMUSCULAR | Status: DC
  Filled 2020-11-03: qty 1

## 2020-11-03 MED ORDER — IOHEXOL 350 MG/ML SOLN
75.0000 mL | Freq: Once | INTRAVENOUS | Status: AC | PRN
  Administered 2020-11-03: 75 mL via INTRAVENOUS

## 2020-11-03 MED ORDER — DEXAMETHASONE SODIUM PHOSPHATE 10 MG/ML IJ SOLN
10.0000 mg | Freq: Once | INTRAMUSCULAR | Status: DC
  Filled 2020-11-03: qty 1

## 2020-11-03 MED ORDER — ACETAMINOPHEN 500 MG PO TABS
1000.0000 mg | ORAL_TABLET | Freq: Once | ORAL | Status: AC
  Administered 2020-11-03: 1000 mg via ORAL
  Filled 2020-11-03: qty 2

## 2020-11-03 MED ORDER — ONDANSETRON HCL 4 MG/2ML IJ SOLN
4.0000 mg | Freq: Once | INTRAMUSCULAR | Status: AC
  Administered 2020-11-03: 4 mg via INTRAVENOUS
  Filled 2020-11-03: qty 2

## 2020-11-03 MED ORDER — PROCHLORPERAZINE EDISYLATE 10 MG/2ML IJ SOLN
10.0000 mg | Freq: Once | INTRAMUSCULAR | Status: DC
  Filled 2020-11-03: qty 2

## 2020-11-03 MED ORDER — HYDROCODONE-ACETAMINOPHEN 5-325 MG PO TABS
2.0000 | ORAL_TABLET | Freq: Once | ORAL | Status: AC
  Administered 2020-11-03: 2 via ORAL
  Filled 2020-11-03: qty 2

## 2020-11-03 NOTE — ED Notes (Signed)
Patient continues with a headache that has not improved with Tylenol. Additional medication was offered as ordered by MD but patient declined as she states taking a bunch of medications causes her anxiety.

## 2020-11-03 NOTE — ED Provider Notes (Signed)
[facility] EMERGENCY DEPARTMENT Provider Note   CSN: [id number] Arrival date & time: 11/02/20  1857     History Chief Complaint  Patient presents with   Shortness of [name] is a 60 y.o. female.  60 year old female who presents to the emergency department today secondary to an episode of feeling hot, sweaty and short of breath.  This lasted for couple hours.  Came on pretty suddenly.  She states that she has a new job where she is actually up move around a whole lot more and she thinks that maybe she is was not used to it.  She states that she feels better now she got better by the time she got here.  She states has been through alcohol withdrawals in the past this is not similar to that.  Last drink was on Thursday.  She states that she had some palpitations with atrial fibrillation when she arrived but that has improved.  She did not feel short of breath at this time.  No pain at this time.  She is curious about what is going on.   Shortness of Breath     Past Medical History:  Diagnosis Date   Abscess of bladder 07/28/2016   Alcohol abuse 07/28/2016   abuse- moderate years ago - states only drinks now on weekends -2 to 8 driinks    Alcohol dependence ([place])    Allergic rhinitis 12/06/2013   Asthma    Asthma 03/16/2015   Atrial fibrillation with RVR ([place]) 04/01/2020   Atrial flutter with rapid ventricular response ([place]) 04/01/2020   COLONIC POLYPS, HX OF 04/05/2010   DIVERTICULITIS, HX OF 04/05/2010   DJD (degenerative joint disease)    right knee, mot to severe   GERD (gastroesophageal reflux disease)    no meds   Heart murmur    hx of    Hyperlipidemia    Hypertension    Impaired glucose tolerance 12/06/2013   Morbid obesity with BMI of 45.0-49.9, adult ([place])    Obesity, Class III, BMI 40-49.9 (morbid obesity) ([place]) 10/03/2014   PALPITATIONS, HX OF 09/14/2007    Patient Active Problem List   Diagnosis Date Noted   Asthma exacerbation  07/22/2020   Hyperkalemia 07/22/2020   Atrial fibrillation with RVR ([place]) 04/01/2020   Atrial flutter with rapid ventricular response ([place]) 04/01/2020   Alcohol dependence with withdrawal ([place]) 12/19/2019   History of pulmonary embolism 12/19/2019   Pneumonia due to COVID-19 virus 02/10/2019   Pulmonary embolism ([place]) 07/06/2018   Leukocytosis 09/22/2017   Rash 09/22/2017   Sepsis ([place]) 09/12/2016   Colovesical fistula 09/12/2016   Alcohol abuse 07/28/2016   Colonic diverticulum 07/28/2016   Hyperlipidemia 07/10/2016   Mass of left side of neck 07/10/2016   Head and neck lymphadenopathy 07/10/2016   Eustachian tube disorder 01/25/2016   Peripheral edema 03/16/2015   Asthma 03/16/2015   Right shoulder pain 03/16/2015   Hypokalemia 10/04/2014   Upper airway cough syndrome 10/03/2014   Obesity, Class III, BMI 40-49.9 (morbid obesity) ([place]) 10/03/2014   Lower back pain 05/25/2014   Bilateral shoulder pain 05/25/2014   Allergic rhinitis 12/06/2013   Impaired glucose tolerance 12/06/2013   Expected blood loss anemia 12/16/2012   S/P left TKA 12/14/2012   Goiter 11/26/2012   Preop exam for internal medicine 11/26/2012   Vaginal bleeding 04/30/2011   Colon polyps 02/10/2011   Eczema 02/10/2011   Depression 02/10/2011   Encounter for well adult exam with  abnormal findings 09/27/2010   Anxiety state 04/05/2010   DIVERTICULITIS, HX OF 04/05/2010   PALPITATIONS, HX OF 09/14/2007   Essential hypertension 04/24/2007   VOCAL CORD DISORDER 04/24/2007   Extrinsic asthma 04/24/2007   GERD 04/24/2007    Past Surgical History:  Procedure Laterality Date   ABDOMINAL HYSTERECTOMY  age 7   fibroids   COLONOSCOPY WITH PROPOFOL N/A 07/25/2016   Procedure: COLONOSCOPY WITH PROPOFOL;  Surgeon: Carol Ada, MD;  Location: [facility] END[facility]COPY;  Service: Endoscopy;  Laterality: N/A;   colonscopy     x 2   IR RADIOLOGIST EVAL & MGMT  08/12/2016   IR RADIOLOGIST EVAL & MGMT  08/21/2016   KNEE  ARTHROSCOPY     left    TOTAL KNEE ARTHROPLASTY  07/29/2011   Procedure: TOTAL KNEE ARTHROPLASTY;  Surgeon: Mauri Pole, MD;  Location: [facility] ORS;  Service: Orthopedics;  Laterality: Right;   TOTAL KNEE ARTHROPLASTY Left 12/14/2012   Procedure: LEFT TOTAL KNEE ARTHROPLASTY;  Surgeon: Mauri Pole, MD;  Location: [facility];  Service: Orthopedics;  Laterality: Left;     OB History     Gravida  1   Para  1   Term      Preterm      AB      Living  1      SAB      IAB      Ectopic      Multiple      Live Births              Family History  Problem Relation Age of Onset   Stroke Mother    COPD Father    Lymphoma Sister     Social History   Tobacco Use   Smoking status: Former    Packs/day: 0.50    Years: 7.00    Pack years: 3.50    Types: Cigarettes    Quit date: 02/11/1988    Years since quitting: 32.7   Smokeless tobacco: Never  Vaping Use   Vaping Use: Never used  Substance Use Topics   Alcohol use: Not Currently    Comment: dependence(5weeks without)   Drug use: Not Currently    Types: Marijuana    Comment: smoked marijuana x 10 years.  Quit in 1985.    Home Medications Prior to Admission medications   Medication Sig Start Date End Date Taking? Authorizing Provider  albuterol (VENTOLIN HFA) 108 (90 Base) MCG/ACT inhaler 2 puffs up to four times per day as needed 10/11/20   Biagio Borg, MD  alum & mag hydroxide-simeth (MAALOX/MYLANTA) 200-200-20 MG/5ML suspension Take by mouth every 6 (six) hours as needed for indigestion or heartburn.    [provider]  apixaban (ELIQUIS) 5 MG TABS tablet Take 1 tablet (5 mg total) by mouth 2 (two) times daily. 10/17/20   Biagio Borg, MD  atorvastatin (LIPITOR) 20 MG tablet Take 1 tablet (20 mg total) by mouth daily. 07/20/20 07/20/21  Biagio Borg, MD  cimetidine (TAGAMET) 200 MG tablet Take 200 mg by mouth every other day.    [provider]  losartan-hydrochlorothiazide (HYZAAR) 100-25 MG tablet  Take 1 tablet by mouth daily. 07/20/20   Biagio Borg, MD  metoprolol succinate (TOPROL-XL) 25 MG 24 hr tablet TAKE 1 TABLET BY MOUTH EVERYDAY AT BEDTIME 08/09/20   Sherran Needs, NP  ondansetron (ZOFRAN ODT) 4 MG disintegrating tablet Take 1 tablet (4 mg total) by mouth every 8 (eight)  hours as needed for nausea or vomiting. 10/17/20   Biagio Borg, MD  potassium chloride (KLOR-CON) 10 MEQ tablet Take 1 tablet (10 mEq total) by mouth 2 (two) times daily. 07/30/20   Biagio Borg, MD  triamcinolone cream (KENALOG) 0.1 % APPLY TO AFFECTED AREA TWICE A DAY 07/08/20   Biagio Borg, MD    Allergies    Morphine, Shrimp [shellfish allergy], Tramadol, Diltiazem hcl, Other, and Latex  Review of Systems   Review of Systems  Respiratory:  Positive for shortness of breath.   All other systems reviewed and are negative.  Physical Exam Updated Vital Signs BP 126/71 (BP Location: Right Arm)   Pulse 97   Temp 97.6 F (36.4 C) (Oral)   Resp 16   Ht 5\' 2"  (1.575 m)   Wt 127 kg   SpO2 95%   BMI 51.21 kg/m   Physical Exam Vitals and nursing note reviewed.  Constitutional:      Appearance: She is well-developed.  HENT:     Head: Normocephalic and atraumatic.  Cardiovascular:     Rate and Rhythm: Normal rate and regular rhythm.  Pulmonary:     Effort: No respiratory distress.     Breath sounds: No stridor. No decreased breath sounds or wheezing.  Chest:     Chest wall: No mass.  Abdominal:     General: There is no distension.  Musculoskeletal:     Cervical back: Normal range of motion.  Skin:    General: Skin is warm and dry.  Neurological:     General: No focal deficit present.     Mental Status: She is alert.    ED Results / Procedures / Treatments   Labs (all labs ordered are listed, but only abnormal results are displayed) Labs Reviewed  CBC WITH DIFFERENTIAL/PLATELET - Abnormal; Notable for the following components:      Result Value   WBC 16.5 (*)    Neutro Abs 15.8 (*)     Lymphs Abs 0.5 (*)    Abs Immature Granulocytes 0.08 (*)    All other components within normal limits  COMPREHENSIVE METABOLIC PANEL - Abnormal; Notable for the following components:   Potassium 3.4 (*)    BUN 22 (*)    Creatinine, Ser 1.08 (*)    Albumin 3.4 (*)    AST 45 (*)    GFR, Estimated 59 (*)    All other components within normal limits  URINALYSIS, ROUTINE W REFLEX MICROSCOPIC - Abnormal; Notable for the following components:   Color, Urine AMBER (*)    APPearance HAZY (*)    All other components within normal limits  RESP PANEL BY RT-PCR (FLU A&B, COVID) ARPGX2  CULTURE, BLOOD (ROUTINE X 2)  CULTURE, BLOOD (ROUTINE X 2)  LIPASE, BLOOD    EKG None  Radiology CT Angio Chest PE W and/or Wo Contrast  Result Date: 11/03/2020 CLINICAL DATA:  PE suspected, high prob Shortness of breath and malaise.  Cough. EXAM: CT ANGIOGRAPHY CHEST WITH CONTRAST TECHNIQUE: Multidetector CT imaging of the chest was performed using the standard protocol during bolus administration of intravenous contrast. Multiplanar CT image reconstructions and MIPs were obtained to evaluate the vascular anatomy. CONTRAST:  27mL OMNIPAQUE IOHEXOL 350 MG/ML SOLN COMPARISON:  Chest radiograph 09/03/2020.  CT 07/06/2018 FINDINGS: Cardiovascular: Previous pulmonary emboli have resolved. There are no filling defects within the pulmonary arteries to suggest pulmonary embolus. Aortic atherosclerosis without dissection or acute aortic findings. Mild cardiomegaly. No pericardial effusion. Mediastinum/Nodes:  Enlarged subcarinal lymph node measuring 19 mm short axis, unchanged or mildly decreased in size from prior exam. No additional mediastinal adenopathy. No hilar adenopathy. Patulous esophagus. Lungs/Pleura: No acute or focal airspace disease. Subsegmental atelectasis in the lingula. No pleural fluid. No pulmonary edema. Trachea and central bronchi are patent. Upper Abdomen: Hepatic steatosis. The previously seen  hyperattenuating lesions in the liver are not significantly changed, less well-defined on the current exam due to phase of contrast. No acute upper abdominal findings. Musculoskeletal: Flowing anterior osteophytes throughout the thoracic spine suggesting diffuse skeletal hyperostosis. Mild broad-based scoliotic curvature. There are no acute or suspicious osseous abnormalities. Review of the MIP images confirms the above findings. IMPRESSION: 1. No pulmonary embolus or acute intrathoracic abnormality. Previous bilateral pulmonary emboli have resolved. 2. Mild cardiomegaly. 3. Hepatic steatosis with stable hyperattenuating hepatic lesions since 2020. Aortic Atherosclerosis (ICD10-I70.0). Electronically Signed   By: Keith Rake M.D.   On: 11/03/2020 04:03    Procedures Procedures   Medications Ordered in ED Medications  ondansetron (ZOFRAN-ODT) disintegrating tablet 4 mg (4 mg Oral Given 11/02/20 1921)  acetaminophen (TYLENOL) tablet 1,000 mg (1,000 mg Oral Given 11/03/20 0306)  ondansetron (ZOFRAN) injection 4 mg (4 mg Intravenous Given 11/03/20 0316)  iohexol (OMNIPAQUE) 350 MG/ML injection 75 mL (75 mLs Intravenous Contrast Given 11/03/20 0352)  HYDROcodone-acetaminophen (NORCO/VICODIN) 5-325 MG per tablet 2 tablet (2 tablets Oral Given 11/03/20 0867)    ED Course  I have reviewed the triage vital signs and the nursing notes.  Pertinent labs & imaging results that were available during my care of the patient were reviewed by me and considered in my medical decision making (see chart for details).    MDM Rules/Calculators/A&P                         Patient is concerned that she might have a PE.  As she was tachycardic, hypoxic and had this flushed feeling on arrival we will go ahead and get a CT scan.  No evidence for urinary tract infection.  Her white blood cell count is slightly high could possibly be a pneumonia versus bacteremia.  We will check blood cultures and observe for little while  as well.  Final Clinical Impression(s) / ED Diagnoses Final diagnoses:  Acute nonintractable headache, unspecified headache type    Rx / DC Orders ED Discharge Orders     None        Steffie Waggoner, Corene Cornea, MD 11/04/20 0111

## 2020-11-04 ENCOUNTER — Emergency Department (HOSPITAL_COMMUNITY)
Admission: EM | Admit: 2020-11-04 | Discharge: 2020-11-04 | Disposition: A | Discharge status: Home / Self Care | Payer: Self-pay | Attending: Emergency Medicine | Admitting: Emergency Medicine

## 2020-11-04 ENCOUNTER — Telehealth (HOSPITAL_BASED_OUTPATIENT_CLINIC_OR_DEPARTMENT_OTHER): Payer: Self-pay | Admitting: Emergency Medicine

## 2020-11-04 ENCOUNTER — Encounter (HOSPITAL_COMMUNITY): Payer: Self-pay | Admitting: Emergency Medicine

## 2020-11-04 ENCOUNTER — Other Ambulatory Visit: Payer: Self-pay

## 2020-11-04 DIAGNOSIS — I10 Essential (primary) hypertension: Secondary | ICD-10-CM | POA: Insufficient documentation

## 2020-11-04 DIAGNOSIS — Z96653 Presence of artificial knee joint, bilateral: Secondary | ICD-10-CM | POA: Insufficient documentation

## 2020-11-04 DIAGNOSIS — D72829 Elevated white blood cell count, unspecified: Secondary | ICD-10-CM

## 2020-11-04 DIAGNOSIS — Z7901 Long term (current) use of anticoagulants: Secondary | ICD-10-CM | POA: Insufficient documentation

## 2020-11-04 DIAGNOSIS — I4891 Unspecified atrial fibrillation: Secondary | ICD-10-CM | POA: Insufficient documentation

## 2020-11-04 DIAGNOSIS — Z8616 Personal history of COVID-19: Secondary | ICD-10-CM | POA: Insufficient documentation

## 2020-11-04 DIAGNOSIS — Z91040 Latex allergy status: Secondary | ICD-10-CM | POA: Insufficient documentation

## 2020-11-04 DIAGNOSIS — J45909 Unspecified asthma, uncomplicated: Secondary | ICD-10-CM | POA: Insufficient documentation

## 2020-11-04 DIAGNOSIS — Z79899 Other long term (current) drug therapy: Secondary | ICD-10-CM | POA: Insufficient documentation

## 2020-11-04 DIAGNOSIS — Z87891 Personal history of nicotine dependence: Secondary | ICD-10-CM | POA: Insufficient documentation

## 2020-11-04 DIAGNOSIS — R7881 Bacteremia: Secondary | ICD-10-CM | POA: Insufficient documentation

## 2020-11-04 LAB — BLOOD CULTURE ID PANEL (REFLEXED) - BCID2

## 2020-11-04 LAB — CBC WITH DIFFERENTIAL/PLATELET
Abs Immature Granulocytes: 0.04 10*3/uL (ref 0.00–0.07)
Basophils Absolute: 0 10*3/uL (ref 0.0–0.1)
Basophils Relative: 0 %
Eosinophils Absolute: 0.2 10*3/uL (ref 0.0–0.5)
Eosinophils Relative: 1 %
HCT: 43 % (ref 36.0–46.0)
Hemoglobin: 14 g/dL (ref 12.0–15.0)
Immature Granulocytes: 0 %
Lymphocytes Relative: 10 %
Lymphs Abs: 1.2 10*3/uL (ref 0.7–4.0)
MCH: 31 pg (ref 26.0–34.0)
MCHC: 32.6 g/dL (ref 30.0–36.0)
MCV: 95.3 fL (ref 80.0–100.0)
Monocytes Absolute: 0.8 10*3/uL (ref 0.1–1.0)
Monocytes Relative: 7 %
Neutro Abs: 10 10*3/uL — ABNORMAL HIGH (ref 1.7–7.7)
Neutrophils Relative %: 82 %
Platelets: 206 10*3/uL (ref 150–400)
RBC: 4.51 MIL/uL (ref 3.87–5.11)
RDW: 14.5 % (ref 11.5–15.5)
WBC: 12.3 10*3/uL — ABNORMAL HIGH (ref 4.0–10.5)
nRBC: 0 % (ref 0.0–0.2)

## 2020-11-04 LAB — BASIC METABOLIC PANEL
Anion gap: 12 (ref 5–15)
BUN: 19 mg/dL (ref 6–20)
CO2: 23 mmol/L (ref 22–32)
Calcium: 8.9 mg/dL (ref 8.9–10.3)
Chloride: 103 mmol/L (ref 98–111)
Creatinine, Ser: 1.21 mg/dL — ABNORMAL HIGH (ref 0.44–1.00)
GFR, Estimated: 52 mL/min — ABNORMAL LOW (ref >60)
Glucose, Bld: 128 mg/dL — ABNORMAL HIGH (ref 70–99)
Potassium: 3.3 mmol/L — ABNORMAL LOW (ref 3.5–5.1)
Sodium: 138 mmol/L (ref 135–145)

## 2020-11-04 LAB — URINALYSIS, ROUTINE W REFLEX MICROSCOPIC
Bilirubin Urine: NEGATIVE
Glucose, UA: NEGATIVE mg/dL
Hgb urine dipstick: NEGATIVE
Ketones, ur: NEGATIVE mg/dL
Leukocytes,Ua: NEGATIVE
Nitrite: NEGATIVE
Protein, ur: NEGATIVE mg/dL
Specific Gravity, Urine: 1.011 (ref 1.005–1.030)
pH: 5 (ref 5.0–8.0)

## 2020-11-04 LAB — HEPATIC FUNCTION PANEL
ALT: 79 U/L — ABNORMAL HIGH (ref 0–44)
AST: 85 U/L — ABNORMAL HIGH (ref 15–41)
Albumin: 2.8 g/dL — ABNORMAL LOW (ref 3.5–5.0)
Alkaline Phosphatase: 87 U/L (ref 38–126)
Bilirubin, Direct: 0.1 mg/dL (ref 0.0–0.2)
Indirect Bilirubin: 0.7 mg/dL (ref 0.3–0.9)
Total Bilirubin: 0.8 mg/dL (ref 0.3–1.2)
Total Protein: 6.8 g/dL (ref 6.5–8.1)

## 2020-11-04 LAB — LACTIC ACID, PLASMA: Lactic Acid, Venous: 2.5 mmol/L (ref 0.5–1.9)

## 2020-11-04 LAB — LIPASE, BLOOD: Lipase: 26 U/L (ref 11–51)

## 2020-11-04 NOTE — ED Notes (Signed)
EDP at bedside  

## 2020-11-04 NOTE — Discharge Summary (Signed)
Physician Discharge Summary  [name] JME:[id number] DOB: [birth year] DOA: 11/04/2020  PCP: Biagio Borg, MD  Admit date: 11/04/2020 Discharge date: 11/04/2020  Admitted From: Home Disposition:  Home  Recommendations for Outpatient Follow-up:  Follow up with PCP in 1-2 weeks Please obtain BMP/CBC in one week Please follow up on the following pending results:  Home Health:No Equipment/Devices:No  Discharge Condition:Stable CODE STATUS:Full  Diet recommendation: Heart Healthy  Brief/Interim Summary: Patient is 60 year old his past medical history of intestine-vaginal fistula, HTN, HLD, A. fib and PE on Eliquis, mild intermittent asthma, morbid obesity, presented to ED 2 days ago with symptoms of new onset generalized weakness, malaise.  Patient reported that, her job involves quite bit walking outside, and on Friday, she had to walk extra amount of time outside in the heat and got very exhausted.  She then went home and drink a large amount of AC water then became stomach sick and felt chills, and nauseous.  She also had brief episode of shortness of breath presentation.  ED work-up involved a CT angiogram negative for PE, white count elevated but lactic acid level normal.  And blood culture was drawn that day.  Patient stayed in the ED for last 2 days, received supportive care but not antibiotics.  This morning, blood culture came back positive E. coli and Enterococcus in 1 out of 4 bottles.  Which I confirmed with microbiology over the phone.  Symptomatic wise, patient reported feeling much better, no fever or chills, and her white count today came down from 16-12.  She does have a history of vaginal intestine fistula, diagnosed 10 years ago, unable to perform fistulectomy due to morbid obesity.  She reported never had infection of the fistula since 10 years ago.  He occasionally have vaginal spotting every few months, but her last time of vaginal bleeding was more than 6 months ago.   She denied any diarrhea or abdominal pain today. UA on admission unremarkable.  Given there is no significant symptoms of septicemia, and blood culture only positive in 1/4 bottles, I recommend patient discharge home, and check her temperature 2 times a day.  And repeat CBC tomorrow as outpatient.  Discharge Diagnoses:  Active Problems:   * No active hospital problems. *  Generalized weakness  Discharge Instructions  -Check temperature in the morning and in the morning x2 days -CBC tomorrow as outpatient. -Follow-up with PCP in 1 week.  Allergies as of 11/04/2020       Reactions   Morphine Itching   Shrimp [shellfish Allergy] Itching, Other (See Comments)   Tongue burns also   Tramadol Other (See Comments)   Caused confusion   Diltiazem Hcl Itching   Pt with itching of the feet when bolus given   Other Other (See Comments)   Latex Rash        Medication List     TAKE these medications    albuterol 108 (90 Base) MCG/ACT inhaler Commonly known as: VENTOLIN HFA 2 puffs up to four times per day as needed   alum & mag hydroxide-simeth 200-200-20 MG/5ML suspension Commonly known as: MAALOX/MYLANTA Take by mouth every 6 (six) hours as needed for indigestion or heartburn.   apixaban 5 MG Tabs tablet Commonly known as: ELIQUIS Take 1 tablet (5 mg total) by mouth 2 (two) times daily.   atorvastatin 20 MG tablet Commonly known as: Lipitor Take 1 tablet (20 mg total) by mouth daily.   cimetidine 200 MG tablet Commonly known as: TAGAMET  Take 200 mg by mouth every other day.   losartan-hydrochlorothiazide 100-25 MG tablet Commonly known as: HYZAAR Take 1 tablet by mouth daily.   metoprolol succinate 25 MG 24 hr tablet Commonly known as: TOPROL-XL TAKE 1 TABLET BY MOUTH EVERYDAY AT BEDTIME   ondansetron 4 MG disintegrating tablet Commonly known as: Zofran ODT Take 1 tablet (4 mg total) by mouth every 8 (eight) hours as needed for nausea or vomiting.   potassium  chloride 10 MEQ tablet Commonly known as: KLOR-CON Take 1 tablet (10 mEq total) by mouth 2 (two) times daily.   triamcinolone cream 0.1 % Commonly known as: KENALOG APPLY TO AFFECTED AREA TWICE A DAY        Allergies  Allergen Reactions   Morphine Itching   Shrimp [Shellfish Allergy] Itching and Other (See Comments)    Tongue burns also   Tramadol Other (See Comments)    Caused confusion   Diltiazem Hcl Itching    Pt with itching of the feet when bolus given   Other Other (See Comments)   Latex Rash    Consultations: None   Procedures/Studies: CT Angio Chest PE W and/or Wo Contrast  Result Date: 11/03/2020 CLINICAL DATA:  PE suspected, high prob Shortness of breath and malaise.  Cough. EXAM: CT ANGIOGRAPHY CHEST WITH CONTRAST TECHNIQUE: Multidetector CT imaging of the chest was performed using the standard protocol during bolus administration of intravenous contrast. Multiplanar CT image reconstructions and MIPs were obtained to evaluate the vascular anatomy. CONTRAST:  50mL OMNIPAQUE IOHEXOL 350 MG/ML SOLN COMPARISON:  Chest radiograph 09/03/2020.  CT 07/06/2018 FINDINGS: Cardiovascular: Previous pulmonary emboli have resolved. There are no filling defects within the pulmonary arteries to suggest pulmonary embolus. Aortic atherosclerosis without dissection or acute aortic findings. Mild cardiomegaly. No pericardial effusion. Mediastinum/Nodes: Enlarged subcarinal lymph node measuring 19 mm short axis, unchanged or mildly decreased in size from prior exam. No additional mediastinal adenopathy. No hilar adenopathy. Patulous esophagus. Lungs/Pleura: No acute or focal airspace disease. Subsegmental atelectasis in the lingula. No pleural fluid. No pulmonary edema. Trachea and central bronchi are patent. Upper Abdomen: Hepatic steatosis. The previously seen hyperattenuating lesions in the liver are not significantly changed, less well-defined on the current exam due to phase of contrast.  No acute upper abdominal findings. Musculoskeletal: Flowing anterior osteophytes throughout the thoracic spine suggesting diffuse skeletal hyperostosis. Mild broad-based scoliotic curvature. There are no acute or suspicious osseous abnormalities. Review of the MIP images confirms the above findings. IMPRESSION: 1. No pulmonary embolus or acute intrathoracic abnormality. Previous bilateral pulmonary emboli have resolved. 2. Mild cardiomegaly. 3. Hepatic steatosis with stable hyperattenuating hepatic lesions since 2020. Aortic Atherosclerosis (ICD10-I70.0). Electronically Signed   By: Keith Rake M.D.   On: 11/03/2020 04:03   (Echo, Carotid, EGD, Colonoscopy, ERCP)    Subjective:   Discharge Exam: Vitals:   11/04/20 1400 11/04/20 1415  BP: (!) 101/57 117/79  Pulse: 88 70  Resp:  19  Temp:    SpO2:  93%   Vitals:   11/04/20 1211 11/04/20 1326 11/04/20 1400 11/04/20 1415  BP: 127/89 117/80 (!) 101/57 117/79  Pulse: (!) 104 95 88 70  Resp: 16 16  19   Temp: 98.1 F (36.7 C)     TempSrc: Oral     SpO2: 93% 93%  93%    General: Pt is alert, awake, not in acute distress Cardiovascular: RRR, S1/S2 +, no rubs, no gallops Respiratory: CTA bilaterally, no wheezing, no rhonchi Abdominal: Soft, NT, ND, bowel sounds +  Extremities: no edema, no cyanosis    The results of significant diagnostics from this hospitalization (including imaging, microbiology, ancillary and laboratory) are listed below for reference.     Microbiology: Recent Results (from the past 240 hour(s))  Resp Panel by RT-PCR (Flu A&B, Covid) Nasopharyngeal Swab     Status: None   Collection Time: 11/02/20  7:13 PM   Specimen: Nasopharyngeal Swab; Nasopharyngeal(NP) swabs in vial transport medium  Result Value Ref Range Status   SARS Coronavirus 2 by RT PCR NEGATIVE NEGATIVE Final    Comment: (NOTE) SARS-CoV-2 target nucleic acids are NOT DETECTED.  The SARS-CoV-2 RNA is generally detectable in upper  respiratory specimens during the acute phase of infection. The lowest concentration of SARS-CoV-2 viral copies this assay can detect is 138 copies/mL. A negative result does not preclude SARS-Cov-2 infection and should not be used as the sole basis for treatment or other patient management decisions. A negative result may occur with  improper specimen collection/handling, submission of specimen other than nasopharyngeal swab, presence of viral mutation(s) within the areas targeted by this assay, and inadequate number of viral copies(<138 copies/mL). A negative result must be combined with clinical observations, patient history, and epidemiological information. The expected result is Negative.  Fact Sheet for Patients:  EntrepreneurPulse.com.au  Fact Sheet for Healthcare Providers:  IncredibleEmployment.be  This test is no t yet approved or cleared by the [place] FDA and  has been authorized for detection and/or diagnosis of SARS-CoV-2 by FDA under an Emergency Use Authorization (EUA). This EUA will remain  in effect (meaning this test can be used) for the duration of the COVID-19 declaration under Section 564(b)(1) of the Act, 21 U.S.C.section 360bbb-3(b)(1), unless the authorization is terminated  or revoked sooner.       Influenza A by PCR NEGATIVE NEGATIVE Final   Influenza B by PCR NEGATIVE NEGATIVE Final    Comment: (NOTE) The Xpert Xpress SARS-CoV-2/FLU/RSV plus assay is intended as an aid in the diagnosis of influenza from Nasopharyngeal swab specimens and should not be used as a sole basis for treatment. Nasal washings and aspirates are unacceptable for Xpert Xpress SARS-CoV-2/FLU/RSV testing.  Fact Sheet for Patients: EntrepreneurPulse.com.au  Fact Sheet for Healthcare Providers: IncredibleEmployment.be  This test is not yet approved or cleared by the [place] FDA and has been  authorized for detection and/or diagnosis of SARS-CoV-2 by FDA under an Emergency Use Authorization (EUA). This EUA will remain in effect (meaning this test can be used) for the duration of the COVID-19 declaration under Section 564(b)(1) of the Act, 21 U.S.C. section 360bbb-3(b)(1), unless the authorization is terminated or revoked.  Performed at [facility], [place] [address] [place], [place] [address]   Blood culture (routine x 2)     Status: None (Preliminary result)   Collection Time: 11/03/20  3:26 AM   Specimen: BLOOD  Result Value Ref Range Status   Specimen Description BLOOD LEFT ANTECUBITAL  Final   Special Requests   Final    BOTTLES DRAWN AEROBIC AND ANAEROBIC Blood Culture adequate volume   Culture  Setup Time   Final    GRAM NEGATIVE RODS ANAEROBIC BOTTLE ONLY CRITICAL RESULT CALLED TO, READ BACK BY AND VERIFIED WITH: RN [place] ON [id number] AT 7408 BY [place] Performed at [facility], [place] [address] [place], [place] [address]    Culture GRAM NEGATIVE RODS  Final   Report Status PENDING  Incomplete  Blood Culture ID Panel (Reflexed)  Status: Abnormal   Collection Time: 11/03/20  3:26 AM  Result Value Ref Range Status   Enterococcus faecalis NOT DETECTED NOT DETECTED Final   Enterococcus Faecium NOT DETECTED NOT DETECTED Final   Listeria monocytogenes NOT DETECTED NOT DETECTED Final   Staphylococcus species NOT DETECTED NOT DETECTED Final   Staphylococcus aureus (BCID) NOT DETECTED NOT DETECTED Final   Staphylococcus epidermidis NOT DETECTED NOT DETECTED Final   Staphylococcus lugdunensis NOT DETECTED NOT DETECTED Final   Streptococcus species NOT DETECTED NOT DETECTED Final   Streptococcus agalactiae NOT DETECTED NOT DETECTED Final   Streptococcus pneumoniae NOT DETECTED NOT DETECTED Final   Streptococcus pyogenes NOT DETECTED NOT DETECTED Final   A.calcoaceticus-baumannii NOT DETECTED NOT DETECTED Final   Bacteroides fragilis NOT DETECTED NOT  DETECTED Final   Enterobacterales DETECTED (A) NOT DETECTED Final    Comment: Enterobacterales represent a large order of gram negative bacteria, not a single organism. CRITICAL RESULT CALLED TO, READ BACK BY AND VERIFIED WITH: RN S.GOUGE ON [id number] AT 1054 BY E.PARRISH    Enterobacter cloacae complex NOT DETECTED NOT DETECTED Final   Escherichia coli DETECTED (A) NOT DETECTED Final    Comment: CRITICAL RESULT CALLED TO, READ BACK BY AND VERIFIED WITH: RN S.GOUGE ON [id number] AT 1054 BY E.PARRISH    Klebsiella aerogenes NOT DETECTED NOT DETECTED Final   Klebsiella oxytoca NOT DETECTED NOT DETECTED Final   Klebsiella pneumoniae NOT DETECTED NOT DETECTED Final   Proteus species NOT DETECTED NOT DETECTED Final   Salmonella species NOT DETECTED NOT DETECTED Final   Serratia marcescens NOT DETECTED NOT DETECTED Final   Haemophilus influenzae NOT DETECTED NOT DETECTED Final   Neisseria meningitidis NOT DETECTED NOT DETECTED Final   Pseudomonas aeruginosa NOT DETECTED NOT DETECTED Final   Stenotrophomonas maltophilia NOT DETECTED NOT DETECTED Final   Candida albicans NOT DETECTED NOT DETECTED Final   Candida auris NOT DETECTED NOT DETECTED Final   Candida glabrata NOT DETECTED NOT DETECTED Final   Candida krusei NOT DETECTED NOT DETECTED Final   Candida parapsilosis NOT DETECTED NOT DETECTED Final   Candida tropicalis NOT DETECTED NOT DETECTED Final   Cryptococcus neoformans/gattii NOT DETECTED NOT DETECTED Final   CTX-M ESBL NOT DETECTED NOT DETECTED Final   Carbapenem resistance IMP NOT DETECTED NOT DETECTED Final   Carbapenem resistance KPC NOT DETECTED NOT DETECTED Final   Carbapenem resistance NDM NOT DETECTED NOT DETECTED Final   Carbapenem resist OXA 48 LIKE NOT DETECTED NOT DETECTED Final   Carbapenem resistance VIM NOT DETECTED NOT DETECTED Final    Comment: Performed at [facility], [address] [place], [place] [address]  Blood culture (routine x 2)     Status: None  (Preliminary result)   Collection Time: 11/03/20  4:34 AM   Specimen: BLOOD  Result Value Ref Range Status   Specimen Description BLOOD RIGHT ANTECUBITAL  Final   Special Requests   Final    BOTTLES DRAWN AEROBIC AND ANAEROBIC Blood Culture adequate volume   Culture   Final    NO GROWTH 1 DAY Performed at [facility], [place] [address] [place], [place] [address]    Report Status PENDING  Incomplete     Labs: BNP (last 3 results) No results for input(s): BNP in the last 8760 hours. Basic Metabolic Panel: Recent Labs  Lab 11/02/20 1925 11/04/20 1221  NA 140 138  K 3.4* 3.3*  CL 103 103  CO2 24 23  GLUCOSE 85 128*  BUN 22* 19  CREATININE 1.08* 1.21*  CALCIUM 9.3 8.9   Liver Function Tests: Recent Labs  Lab 11/02/20 1925  AST 45*  ALT 41  ALKPHOS 101  BILITOT 1.1  PROT 7.2  ALBUMIN 3.4*   Recent Labs  Lab 11/02/20 1925  LIPASE 24   No results for input(s): AMMONIA in the last 168 hours. CBC: Recent Labs  Lab 11/02/20 1925 11/04/20 1221  WBC 16.5* 12.3*  NEUTROABS 15.8* 10.0*  HGB 14.6 14.0  HCT 45.1 43.0  MCV 95.1 95.3  PLT 218 206   Cardiac Enzymes: No results for input(s): CKTOTAL, CKMB, CKMBINDEX, TROPONINI in the last 168 hours. BNP: Invalid input(s): POCBNP CBG: No results for input(s): GLUCAP in the last 168 hours. D-Dimer No results for input(s): DDIMER in the last 72 hours. Hgb A1c No results for input(s): HGBA1C in the last 72 hours. Lipid Profile No results for input(s): CHOL, HDL, LDLCALC, TRIG, CHOLHDL, LDLDIRECT in the last 72 hours. Thyroid function studies No results for input(s): TSH, T4TOTAL, T3FREE, THYROIDAB in the last 72 hours.  Invalid input(s): FREET3 Anemia work up No results for input(s): VITAMINB12, FOLATE, FERRITIN, TIBC, IRON, RETICCTPCT in the last 72 hours. Urinalysis    Component Value Date/Time   COLORURINE AMBER (A) 11/02/2020 1913   APPEARANCEUR HAZY (A) 11/02/2020 1913   LABSPEC 1.027 11/02/2020  1913   PHURINE 5.0 11/02/2020 1913   GLUCOSEU NEGATIVE 11/02/2020 1913   GLUCOSEU NEGATIVE 07/20/2020 0930   HGBUR NEGATIVE 11/02/2020 1913   BILIRUBINUR NEGATIVE 11/02/2020 1913   BILIRUBINUR negative 09/16/2011 1047   [place] 11/02/2020 1913   PROTEINUR NEGATIVE 11/02/2020 1913   UROBILINOGEN 1.0 07/20/2020 0930   NITRITE NEGATIVE 11/02/2020 1913   LEUKOCYTESUR NEGATIVE 11/02/2020 1913   Sepsis Labs Invalid input(s): PROCALCITONIN,  WBC,  LACTICIDVEN Microbiology Recent Results (from the past 240 hour(s))  Resp Panel by RT-PCR (Flu A&B, Covid) Nasopharyngeal Swab     Status: None   Collection Time: 11/02/20  7:13 PM   Specimen: Nasopharyngeal Swab; Nasopharyngeal(NP) swabs in vial transport medium  Result Value Ref Range Status   SARS Coronavirus 2 by RT PCR NEGATIVE NEGATIVE Final    Comment: (NOTE) SARS-CoV-2 target nucleic acids are NOT DETECTED.  The SARS-CoV-2 RNA is generally detectable in upper respiratory specimens during the acute phase of infection. The lowest concentration of SARS-CoV-2 viral copies this assay can detect is 138 copies/mL. A negative result does not preclude SARS-Cov-2 infection and should not be used as the sole basis for treatment or other patient management decisions. A negative result may occur with  improper specimen collection/handling, submission of specimen other than nasopharyngeal swab, presence of viral mutation(s) within the areas targeted by this assay, and inadequate number of viral copies(<138 copies/mL). A negative result must be combined with clinical observations, patient history, and epidemiological information. The expected result is Negative.  Fact Sheet for Patients:  EntrepreneurPulse.com.au  Fact Sheet for Healthcare Providers:  IncredibleEmployment.be  This test is no t yet approved or cleared by the [place] FDA and  has been authorized for detection and/or diagnosis  of SARS-CoV-2 by FDA under an Emergency Use Authorization (EUA). This EUA will remain  in effect (meaning this test can be used) for the duration of the COVID-19 declaration under Section 564(b)(1) of the Act, 21 U.S.C.section 360bbb-3(b)(1), unless the authorization is terminated  or revoked sooner.       Influenza A by PCR NEGATIVE NEGATIVE Final   Influenza B by PCR NEGATIVE NEGATIVE Final  Comment: (NOTE) The Xpert Xpress SARS-CoV-2/FLU/RSV plus assay is intended as an aid in the diagnosis of influenza from Nasopharyngeal swab specimens and should not be used as a sole basis for treatment. Nasal washings and aspirates are unacceptable for Xpert Xpress SARS-CoV-2/FLU/RSV testing.  Fact Sheet for Patients: EntrepreneurPulse.com.au  Fact Sheet for Healthcare Providers: IncredibleEmployment.be  This test is not yet approved or cleared by the [place] FDA and has been authorized for detection and/or diagnosis of SARS-CoV-2 by FDA under an Emergency Use Authorization (EUA). This EUA will remain in effect (meaning this test can be used) for the duration of the COVID-19 declaration under Section 564(b)(1) of the Act, 21 U.S.C. section 360bbb-3(b)(1), unless the authorization is terminated or revoked.  Performed at [facility], [place] [address] [place], [place] [address]   Blood culture (routine x 2)     Status: None (Preliminary result)   Collection Time: 11/03/20  3:26 AM   Specimen: BLOOD  Result Value Ref Range Status   Specimen Description BLOOD LEFT ANTECUBITAL  Final   Special Requests   Final    BOTTLES DRAWN AEROBIC AND ANAEROBIC Blood Culture adequate volume   Culture  Setup Time   Final    GRAM NEGATIVE RODS ANAEROBIC BOTTLE ONLY CRITICAL RESULT CALLED TO, READ BACK BY AND VERIFIED WITH: RN [place] ON [id number] AT 8937 BY [place] Performed at [facility], [place] [address], [place], [place] [address]    Culture  GRAM NEGATIVE RODS  Final   Report Status PENDING  Incomplete  Blood Culture ID Panel (Reflexed)     Status: Abnormal   Collection Time: 11/03/20  3:26 AM  Result Value Ref Range Status   Enterococcus faecalis NOT DETECTED NOT DETECTED Final   Enterococcus Faecium NOT DETECTED NOT DETECTED Final   Listeria monocytogenes NOT DETECTED NOT DETECTED Final   Staphylococcus species NOT DETECTED NOT DETECTED Final   Staphylococcus aureus (BCID) NOT DETECTED NOT DETECTED Final   Staphylococcus epidermidis NOT DETECTED NOT DETECTED Final   Staphylococcus lugdunensis NOT DETECTED NOT DETECTED Final   Streptococcus species NOT DETECTED NOT DETECTED Final   Streptococcus agalactiae NOT DETECTED NOT DETECTED Final   Streptococcus pneumoniae NOT DETECTED NOT DETECTED Final   Streptococcus pyogenes NOT DETECTED NOT DETECTED Final   A.calcoaceticus-baumannii NOT DETECTED NOT DETECTED Final   Bacteroides fragilis NOT DETECTED NOT DETECTED Final   Enterobacterales DETECTED (A) NOT DETECTED Final    Comment: Enterobacterales represent a large order of gram negative bacteria, not a single organism. CRITICAL RESULT CALLED TO, READ BACK BY AND VERIFIED WITH: RN S.GOUGE ON [id number] AT 1054 BY E.PARRISH    Enterobacter cloacae complex NOT DETECTED NOT DETECTED Final   Escherichia coli DETECTED (A) NOT DETECTED Final    Comment: CRITICAL RESULT CALLED TO, READ BACK BY AND VERIFIED WITH: RN S.GOUGE ON [id number] AT 1054 BY E.PARRISH    Klebsiella aerogenes NOT DETECTED NOT DETECTED Final   Klebsiella oxytoca NOT DETECTED NOT DETECTED Final   Klebsiella pneumoniae NOT DETECTED NOT DETECTED Final   Proteus species NOT DETECTED NOT DETECTED Final   Salmonella species NOT DETECTED NOT DETECTED Final   Serratia marcescens NOT DETECTED NOT DETECTED Final   Haemophilus influenzae NOT DETECTED NOT DETECTED Final   Neisseria meningitidis NOT DETECTED NOT DETECTED Final   Pseudomonas aeruginosa NOT DETECTED NOT  DETECTED Final   Stenotrophomonas maltophilia NOT DETECTED NOT DETECTED Final   Candida albicans NOT DETECTED NOT DETECTED Final   Candida auris NOT  DETECTED NOT DETECTED Final   Candida glabrata NOT DETECTED NOT DETECTED Final   Candida krusei NOT DETECTED NOT DETECTED Final   Candida parapsilosis NOT DETECTED NOT DETECTED Final   Candida tropicalis NOT DETECTED NOT DETECTED Final   Cryptococcus neoformans/gattii NOT DETECTED NOT DETECTED Final   CTX-M ESBL NOT DETECTED NOT DETECTED Final   Carbapenem resistance IMP NOT DETECTED NOT DETECTED Final   Carbapenem resistance KPC NOT DETECTED NOT DETECTED Final   Carbapenem resistance NDM NOT DETECTED NOT DETECTED Final   Carbapenem resist OXA 48 LIKE NOT DETECTED NOT DETECTED Final   Carbapenem resistance VIM NOT DETECTED NOT DETECTED Final    Comment: Performed at [facility], [place] [address] [place], [place] [address]  Blood culture (routine x 2)     Status: None (Preliminary result)   Collection Time: 11/03/20  4:34 AM   Specimen: BLOOD  Result Value Ref Range Status   Specimen Description BLOOD RIGHT ANTECUBITAL  Final   Special Requests   Final    BOTTLES DRAWN AEROBIC AND ANAEROBIC Blood Culture adequate volume   Culture   Final    NO GROWTH 1 DAY Performed at [facility], [place] [address], [place],  [address]    Report Status PENDING  Incomplete     Time coordinating discharge: Over 30 minutes  SIGNED:   Lequita Halt, MD  [facility] 11/04/2020, 2:56 PM Pager [phone]

## 2020-11-04 NOTE — ED Notes (Signed)
Admitting MD at bedside.

## 2020-11-04 NOTE — ED Provider Notes (Addendum)
[facility] EMERGENCY DEPARTMENT Provider Note   CSN: [id number] Arrival date & time: 11/04/20  1206     History No chief complaint on file.   [name] is a 60 y.o. female.  Patient followed by Dr. Cathlean Cower.  Patient was seen September 2030 in the ED for a work-up that included blood cultures.  Lactic acid was not done.  Patient did have CT angio chest to rule out pulmonary embolus.  Patient got a call for positive blood cultures growing gram-negative rods from the cultures that were done on the 23rd.  Patient's white blood cell count of 23rd was 16.5.  The cultures are now showing E. coli and Enterobacterales.  Patient states she feels fine.  She never received antibiotics.  If she had not been called she would have felt that she was all better and wound of sought medical treatment at all.  Patient does states she has a history for many many years of a fistula that goes from the vaginal area to the small intestines and occasionally she gets some bleeding from that.  Patient denies any abdominal pain any of any fevers any chest pain any shortness of breath any body aches any headache at this time.      Past Medical History:  Diagnosis Date   Abscess of bladder 07/28/2016   Alcohol abuse 07/28/2016   abuse- moderate years ago - states only drinks now on weekends -2 to 8 driinks    Alcohol dependence ([place])    Allergic rhinitis 12/06/2013   Asthma    Asthma 03/16/2015   Atrial fibrillation with RVR ([place]) 04/01/2020   Atrial flutter with rapid ventricular response ([place]) 04/01/2020   COLONIC POLYPS, HX OF 04/05/2010   DIVERTICULITIS, HX OF 04/05/2010   DJD (degenerative joint disease)    right knee, mot to severe   GERD (gastroesophageal reflux disease)    no meds   Heart murmur    hx of    Hyperlipidemia    Hypertension    Impaired glucose tolerance 12/06/2013   Morbid obesity with BMI of 45.0-49.9, adult ([place])    Obesity, Class III, BMI 40-49.9 (morbid  obesity) ([place]) 10/03/2014   PALPITATIONS, HX OF 09/14/2007    Patient Active Problem List   Diagnosis Date Noted   Asthma exacerbation 07/22/2020   Hyperkalemia 07/22/2020   Atrial fibrillation with RVR ([place]) 04/01/2020   Atrial flutter with rapid ventricular response ([place]) 04/01/2020   Alcohol dependence with withdrawal ([place]) 12/19/2019   History of pulmonary embolism 12/19/2019   Pneumonia due to COVID-19 virus 02/10/2019   Pulmonary embolism ([place]) 07/06/2018   Leukocytosis 09/22/2017   Rash 09/22/2017   Sepsis ([place]) 09/12/2016   Colovesical fistula 09/12/2016   Alcohol abuse 07/28/2016   Colonic diverticulum 07/28/2016   Hyperlipidemia 07/10/2016   Mass of left side of neck 07/10/2016   Head and neck lymphadenopathy 07/10/2016   Eustachian tube disorder 01/25/2016   Peripheral edema 03/16/2015   Asthma 03/16/2015   Right shoulder pain 03/16/2015   Hypokalemia 10/04/2014   Upper airway cough syndrome 10/03/2014   Obesity, Class III, BMI 40-49.9 (morbid obesity) ([place]) 10/03/2014   Lower back pain 05/25/2014   Bilateral shoulder pain 05/25/2014   Allergic rhinitis 12/06/2013   Impaired glucose tolerance 12/06/2013   Expected blood loss anemia 12/16/2012   S/P left TKA 12/14/2012   Goiter 11/26/2012   Preop exam for internal medicine 11/26/2012   Vaginal bleeding 04/30/2011   Colon polyps 02/10/2011  Eczema 02/10/2011   Depression 02/10/2011   Encounter for well adult exam with abnormal findings 09/27/2010   Anxiety state 04/05/2010   DIVERTICULITIS, HX OF 04/05/2010   PALPITATIONS, HX OF 09/14/2007   Essential hypertension 04/24/2007   VOCAL CORD DISORDER 04/24/2007   Extrinsic asthma 04/24/2007   GERD 04/24/2007    Past Surgical History:  Procedure Laterality Date   ABDOMINAL HYSTERECTOMY  age 73   fibroids   COLONOSCOPY WITH PROPOFOL N/A 07/25/2016   Procedure: COLONOSCOPY WITH PROPOFOL;  Surgeon: Carol Ada, MD;  Location: [facility];  Service: Endoscopy;   Laterality: N/A;   colonscopy     x 2   IR RADIOLOGIST EVAL & MGMT  08/12/2016   IR RADIOLOGIST EVAL & MGMT  08/21/2016   KNEE ARTHROSCOPY     left    TOTAL KNEE ARTHROPLASTY  07/29/2011   Procedure: TOTAL KNEE ARTHROPLASTY;  Surgeon: Mauri Pole, MD;  Location: [facility];  Service: Orthopedics;  Laterality: Right;   TOTAL KNEE ARTHROPLASTY Left 12/14/2012   Procedure: LEFT TOTAL KNEE ARTHROPLASTY;  Surgeon: Mauri Pole, MD;  Location: [facility];  Service: Orthopedics;  Laterality: Left;     OB History     Gravida  1   Para  1   Term      Preterm      AB      Living  1      SAB      IAB      Ectopic      Multiple      Live Births              Family History  Problem Relation Age of Onset   Stroke Mother    COPD Father    Lymphoma Sister     Social History   Tobacco Use   Smoking status: Former    Packs/day: 0.50    Years: 7.00    Pack years: 3.50    Types: Cigarettes    Quit date: 02/11/1988    Years since quitting: 32.7   Smokeless tobacco: Never  Vaping Use   Vaping Use: Never used  Substance Use Topics   Alcohol use: Not Currently    Comment: dependence(5weeks without)   Drug use: Not Currently    Types: Marijuana    Comment: smoked marijuana x 10 years.  Quit in 1985.    Home Medications Prior to Admission medications   Medication Sig Start Date End Date Taking? Authorizing Provider  albuterol (VENTOLIN HFA) 108 (90 Base) MCG/ACT inhaler 2 puffs up to four times per day as needed 10/11/20   Biagio Borg, MD  alum & mag hydroxide-simeth (MAALOX/MYLANTA) 200-200-20 MG/5ML suspension Take by mouth every 6 (six) hours as needed for indigestion or heartburn.    [provider]  apixaban (ELIQUIS) 5 MG TABS tablet Take 1 tablet (5 mg total) by mouth 2 (two) times daily. 10/17/20   Biagio Borg, MD  atorvastatin (LIPITOR) 20 MG tablet Take 1 tablet (20 mg total) by mouth daily. 07/20/20 07/20/21  Biagio Borg, MD  cimetidine (TAGAMET) 200 MG  tablet Take 200 mg by mouth every other day.    [provider]  losartan-hydrochlorothiazide (HYZAAR) 100-25 MG tablet Take 1 tablet by mouth daily. 07/20/20   Biagio Borg, MD  metoprolol succinate (TOPROL-XL) 25 MG 24 hr tablet TAKE 1 TABLET BY MOUTH EVERYDAY AT BEDTIME 08/09/20   Sherran Needs, NP  ondansetron (ZOFRAN ODT) 4  MG disintegrating tablet Take 1 tablet (4 mg total) by mouth every 8 (eight) hours as needed for nausea or vomiting. 10/17/20   Biagio Borg, MD  potassium chloride (KLOR-CON) 10 MEQ tablet Take 1 tablet (10 mEq total) by mouth 2 (two) times daily. 07/30/20   Biagio Borg, MD  triamcinolone cream (KENALOG) 0.1 % APPLY TO AFFECTED AREA TWICE A DAY 07/08/20   Biagio Borg, MD    Allergies    Morphine, Shrimp [shellfish allergy], Tramadol, Diltiazem hcl, Other, and Latex  Review of Systems   Review of Systems  Constitutional:  Negative for chills, diaphoresis and fever.  HENT:  Negative for ear pain and sore throat.   Eyes:  Negative for pain and visual disturbance.  Respiratory:  Negative for cough and shortness of breath.   Cardiovascular:  Negative for chest pain and palpitations.  Gastrointestinal:  Negative for abdominal pain, diarrhea, nausea and vomiting.  Genitourinary:  Negative for dysuria and hematuria.  Musculoskeletal:  Negative for arthralgias and back pain.  Skin:  Negative for color change and rash.  Neurological:  Negative for seizures and syncope.  All other systems reviewed and are negative.  Physical Exam Updated Vital Signs BP (!) 101/57   Pulse 88   Temp 98.1 F (36.7 C) (Oral)   Resp 16   SpO2 93%   Physical Exam Vitals and nursing note reviewed.  Constitutional:      General: She is not in acute distress.    Appearance: Normal appearance. She is well-developed. She is not ill-appearing, toxic-appearing or diaphoretic.  HENT:     Head: Normocephalic and atraumatic.  Eyes:     Extraocular Movements: Extraocular movements  intact.     Conjunctiva/sclera: Conjunctivae normal.     Pupils: Pupils are equal, round, and reactive to light.  Cardiovascular:     Rate and Rhythm: Normal rate and regular rhythm.     Heart sounds: No murmur heard. Pulmonary:     Effort: Pulmonary effort is normal. No respiratory distress.     Breath sounds: Normal breath sounds. No wheezing, rhonchi or rales.  Chest:     Chest wall: No tenderness.  Abdominal:     Palpations: Abdomen is soft.     Tenderness: There is no abdominal tenderness. There is no guarding.  Musculoskeletal:        General: Normal range of motion.     Cervical back: Neck supple. No rigidity.  Skin:    General: Skin is warm and dry.  Neurological:     General: No focal deficit present.     Mental Status: She is alert and oriented to person, place, and time.    ED Results / Procedures / Treatments   Labs (all labs ordered are listed, but only abnormal results are displayed) Labs Reviewed  BASIC METABOLIC PANEL - Abnormal; Notable for the following components:      Result Value   Potassium 3.3 (*)    Glucose, Bld 128 (*)    Creatinine, Ser 1.21 (*)    GFR, Estimated 52 (*)    All other components within normal limits  CBC WITH DIFFERENTIAL/PLATELET - Abnormal; Notable for the following components:   WBC 12.3 (*)    Neutro Abs 10.0 (*)    All other components within normal limits  URINALYSIS, ROUTINE W REFLEX MICROSCOPIC  HEPATIC FUNCTION PANEL  LIPASE, BLOOD  LACTIC ACID, PLASMA    EKG None  Radiology CT Angio Chest PE W and/or Wo Contrast  Result Date: 11/03/2020 CLINICAL DATA:  PE suspected, high prob Shortness of breath and malaise.  Cough. EXAM: CT ANGIOGRAPHY CHEST WITH CONTRAST TECHNIQUE: Multidetector CT imaging of the chest was performed using the standard protocol during bolus administration of intravenous contrast. Multiplanar CT image reconstructions and MIPs were obtained to evaluate the vascular anatomy. CONTRAST:  59mL OMNIPAQUE  IOHEXOL 350 MG/ML SOLN COMPARISON:  Chest radiograph 09/03/2020.  CT 07/06/2018 FINDINGS: Cardiovascular: Previous pulmonary emboli have resolved. There are no filling defects within the pulmonary arteries to suggest pulmonary embolus. Aortic atherosclerosis without dissection or acute aortic findings. Mild cardiomegaly. No pericardial effusion. Mediastinum/Nodes: Enlarged subcarinal lymph node measuring 19 mm short axis, unchanged or mildly decreased in size from prior exam. No additional mediastinal adenopathy. No hilar adenopathy. Patulous esophagus. Lungs/Pleura: No acute or focal airspace disease. Subsegmental atelectasis in the lingula. No pleural fluid. No pulmonary edema. Trachea and central bronchi are patent. Upper Abdomen: Hepatic steatosis. The previously seen hyperattenuating lesions in the liver are not significantly changed, less well-defined on the current exam due to phase of contrast. No acute upper abdominal findings. Musculoskeletal: Flowing anterior osteophytes throughout the thoracic spine suggesting diffuse skeletal hyperostosis. Mild broad-based scoliotic curvature. There are no acute or suspicious osseous abnormalities. Review of the MIP images confirms the above findings. IMPRESSION: 1. No pulmonary embolus or acute intrathoracic abnormality. Previous bilateral pulmonary emboli have resolved. 2. Mild cardiomegaly. 3. Hepatic steatosis with stable hyperattenuating hepatic lesions since 2020. Aortic Atherosclerosis (ICD10-I70.0). Electronically Signed   By: Keith Rake M.D.   On: 11/03/2020 04:03    Procedures Procedures   Medications Ordered in ED Medications - No data to display  ED Course  I have reviewed the triage vital signs and the nursing notes.  Pertinent labs & imaging results that were available during my care of the patient were reviewed by me and considered in my medical decision making (see chart for details).    MDM Rules/Calculators/A&P                            Patient exam without any hints on the possible cause of the gram-negative rod blood cultures.  But obviously this does not sound like a skin contaminant.  I guess the fistula is a possible source.  Patient exam with no heart murmur.  Abdomen soft nontender.  We will go ahead and repeat her urinalysis and some liver function test today and do a lactic acid.  White blood cell count today is 12.3.  2 days ago it was 16.  Patient's creatinine at 1.21.  GFR 52.  Normal differential.  On the CBC.  We will discussed with the hospitalist patient is followed by [facility] primary [facility].  Normally gram-negative rods does require admission.  Patient clinically looks wonderful.  Will discuss with them.  Patient without any septic parameters on vital signs.  Discussed with hospitalist.  We will go ahead and repeat cultures.  And they will admit her and observe her to see if his cultures remain negative overnight.  Final Clinical Impression(s) / ED Diagnoses Final diagnoses:  Positive blood cultures    Rx / DC Orders ED Discharge Orders     None        Fredia Sorrow, MD 11/04/20 1425    Fredia Sorrow, MD 11/04/20 1439

## 2020-11-04 NOTE — ED Provider Notes (Signed)
Emergency Medicine Provider Triage Evaluation Note  [name] , a 60 y.o. female  was evaluated in triage.  Pt complains of being called and told that she had positive blood cultures.  Patient states that she feels okay, has just been feeling more fatigued.  Denying any fever.  Review of Systems  Positive: Fatigue Negative: Fever  Physical Exam  BP 127/89 (BP Location: Left Arm)   Pulse (!) 104   Temp 98.1 F (36.7 C) (Oral)   Resp 16   SpO2 93%  Gen:   Awake, no distress   Resp:  Normal effort  MSK:   Moves extremities without difficulty  Other:  No signs of respiratory distress  Medical Decision Making  Medically screening exam initiated at 12:17 PM.  Appropriate orders placed.  [name] was informed that the remainder of the evaluation will be completed by another provider, this initial triage assessment does not replace that evaluation, and the importance of remaining in the ED until their evaluation is complete.  Basic labs obtained she will need antibiotic therapy for blood cultures positive for Enterobacter and E. coli   Delia Heady, PA-C 11/04/20 1218    Fredia Sorrow, MD 11/12/20 1318

## 2020-11-04 NOTE — ED Triage Notes (Signed)
Pt states she received a call that her blood culture is + for growth.  States she was seen in ED on 9/23 but is feeling better now.  Denies chills/fever.

## 2020-11-04 NOTE — ED Notes (Signed)
Pt verbalized understanding of discharge paperwork and follow-up care.  °

## 2020-11-04 NOTE — Progress Notes (Signed)
Lactic acid came back 2.5. Given there is no hypotension, no fever or chills, and WBC count trending down without any ABX in last two days, elevated lactate not clinically significant. Plan explained to patient to check Temp BID and write down and CBC in AM as outpatient.

## 2020-11-06 LAB — CULTURE, BLOOD (ROUTINE X 2): Special Requests: ADEQUATE

## 2020-11-07 ENCOUNTER — Telehealth: Payer: Self-pay

## 2020-11-07 ENCOUNTER — Encounter: Payer: Self-pay | Admitting: Internal Medicine

## 2020-11-07 NOTE — Telephone Encounter (Signed)
Post ED Visit - Positive Culture Follow-up  Culture report reviewed by antimicrobial stewardship pharmacist: [facility] Team [x]  Joetta Manners, Pharm.D. BCCCP []  Heide Guile, Pharm.D., BCPS AQ-ID []  Parks Neptune, Pharm.D., BCPS []  Alycia Rossetti, Pharm.D., BCPS []  [place], Pharm.D., BCPS, AAHIVP []  Legrand Como, Pharm.D., BCPS, AAHIVP []  Salome Arnt, PharmD, BCPS []  Johnnette Gourd, PharmD, BCPS []  Hughes Better, PharmD, BCPS []  Leeroy Cha, PharmD []  Laqueta Linden, PharmD, BCPS []  Albertina Parr, PharmD  [facility] Team []  Leodis Sias, PharmD []  Lindell Spar, PharmD []  Royetta Asal, PharmD []  Graylin Shiver, Rph []  Rema Fendt) Glennon Mac, PharmD []  Arlyn Dunning, PharmD []  Netta Cedars, PharmD []  Dia Sitter, PharmD []  Leone Haven, PharmD []  Gretta Arab, PharmD []  Theodis Shove, PharmD []  Peggyann Juba, PharmD []  Reuel Boom, PharmD   Positive blood culture Addressed already by MD Discharged with repeat CBC and check tem in 48 hours.  No further patient follow-up is required at this time.  Glennon Hamilton 11/07/2020, 9:17 AM

## 2020-11-08 LAB — CULTURE, BLOOD (ROUTINE X 2)
Culture: NO GROWTH
Special Requests: ADEQUATE

## 2020-11-09 ENCOUNTER — Other Ambulatory Visit: Payer: Self-pay | Admitting: Internal Medicine

## 2020-11-09 LAB — CULTURE, BLOOD (ROUTINE X 2)
Culture: NO GROWTH
Culture: NO GROWTH
Special Requests: ADEQUATE
Special Requests: ADEQUATE

## 2020-11-14 ENCOUNTER — Emergency Department (HOSPITAL_COMMUNITY)

## 2020-11-14 ENCOUNTER — Encounter (HOSPITAL_COMMUNITY): Payer: Self-pay | Admitting: *Deleted

## 2020-11-14 ENCOUNTER — Other Ambulatory Visit: Payer: Self-pay

## 2020-11-14 ENCOUNTER — Emergency Department (HOSPITAL_COMMUNITY)
Admission: EM | Admit: 2020-11-14 | Discharge: 2020-11-14 | Disposition: A | Discharge status: Home / Self Care | Attending: Student | Admitting: Student

## 2020-11-14 DIAGNOSIS — Z79899 Other long term (current) drug therapy: Secondary | ICD-10-CM | POA: Diagnosis not present

## 2020-11-14 DIAGNOSIS — Z96653 Presence of artificial knee joint, bilateral: Secondary | ICD-10-CM | POA: Diagnosis not present

## 2020-11-14 DIAGNOSIS — Y990 Civilian activity done for income or pay: Secondary | ICD-10-CM | POA: Insufficient documentation

## 2020-11-14 DIAGNOSIS — W07XXXA Fall from chair, initial encounter: Secondary | ICD-10-CM | POA: Diagnosis not present

## 2020-11-14 DIAGNOSIS — J45909 Unspecified asthma, uncomplicated: Secondary | ICD-10-CM | POA: Insufficient documentation

## 2020-11-14 DIAGNOSIS — Z8616 Personal history of COVID-19: Secondary | ICD-10-CM | POA: Insufficient documentation

## 2020-11-14 DIAGNOSIS — Z87891 Personal history of nicotine dependence: Secondary | ICD-10-CM | POA: Insufficient documentation

## 2020-11-14 DIAGNOSIS — M79601 Pain in right arm: Secondary | ICD-10-CM

## 2020-11-14 DIAGNOSIS — I4891 Unspecified atrial fibrillation: Secondary | ICD-10-CM | POA: Diagnosis not present

## 2020-11-14 DIAGNOSIS — Z91040 Latex allergy status: Secondary | ICD-10-CM | POA: Diagnosis not present

## 2020-11-14 DIAGNOSIS — Z7901 Long term (current) use of anticoagulants: Secondary | ICD-10-CM | POA: Diagnosis not present

## 2020-11-14 DIAGNOSIS — I10 Essential (primary) hypertension: Secondary | ICD-10-CM | POA: Diagnosis not present

## 2020-11-14 MED ORDER — ACETAMINOPHEN 500 MG PO TABS: 1000.0000 mg | ORAL_TABLET | Freq: Once | ORAL | Status: DC

## 2020-11-14 MED ORDER — KETOROLAC TROMETHAMINE 15 MG/ML IJ SOLN: 15.0000 mg | Freq: Once | INTRAMUSCULAR | Status: DC

## 2020-11-14 MED ORDER — HYDROCODONE-ACETAMINOPHEN 5-325 MG PO TABS: 1.0000 | ORAL_TABLET | ORAL | 0 refills | Status: DC | PRN

## 2020-11-14 MED ORDER — IBUPROFEN 400 MG PO TABS: 600.0000 mg | ORAL_TABLET | Freq: Once | ORAL | Status: DC

## 2020-11-14 MED ORDER — HYDROCODONE-ACETAMINOPHEN 5-325 MG PO TABS
1.0000 | ORAL_TABLET | Freq: Once | ORAL | Status: AC
Start: 2020-11-14 — End: 2020-11-14
  Administered 2020-11-14: 1 via ORAL
  Filled 2020-11-14: qty 1

## 2020-11-14 NOTE — ED Triage Notes (Signed)
The pt is c/o pain and swelling in her rt upper arm  she was sitting on a chair with wheels and she had both her arms on the chair  as she  tried to sit down the chair started slipping away from her  then she felt and heard something rip in her rt upper arm  that area is painful and swollen since then

## 2020-11-14 NOTE — Progress Notes (Signed)
Orthopedic Tech Progress Note Patient Details:  [name] 1960/09/30 [id number]  Ortho Devices Type of Ortho Device: Shoulder immobilizer Ortho Device/Splint Location: Right arm Ortho Device/Splint Interventions: Ordered, Application, Adjustment   Post Interventions Patient Tolerated: Well Instructions Provided: Adjustment of device, Care of device  [place] 11/14/2020, 8:40 PM

## 2020-11-14 NOTE — ED Provider Notes (Signed)
Emergency Medicine Provider Triage Evaluation Note  [name] , a 60 y.o. female  was evaluated in triage.  Pt complains of right upper arm pain.  She states that she was at work sitting in a chair with wheels and the chair started slipping away and she felt and heard something rip in her right upper arm.  She is right-hand dominant.  [facility] and they sent her here.  She denies any numbness.  She did not strike her head or pass out.  Review of Systems  Positive: Right upper arm pain and swelling Negative: Weakness, head injury  Physical Exam  BP (!) 142/64   Pulse 71   Temp 98.7 F (37.1 C)   Resp 20   Ht 5\' 2"  (1.575 m)   Wt 127 kg   SpO2 97%   BMI 51.21 kg/m  Gen:   Awake, no distress   Resp:  Normal effort  MSK:   There is obvious edema in the distal right upper arm.  This is localized on the anterior aspect.  This area is not pulsatile. Other:  2+ right radial pulse.  Sensation intact to light touch to right arm and hand.  Medical Decision Making  Medically screening exam initiated at 7:03 PM.  Appropriate orders placed.  [name] was informed that the remainder of the evaluation will be completed by another provider, this initial triage assessment does not replace that evaluation, and the importance of remaining in the ED until their evaluation is complete.  Will obtain x-ray to evaluate for underlying osseous abnormality, clinically suspect the patient has avulsed head of a biceps.  Note: Portions of this report may have been transcribed using voice recognition software. Every effort was made to ensure accuracy; however, inadvertent computerized transcription errors may be present    Ollen Gross 11/14/20 1905    Valarie Merino, MD 11/14/20 2306

## 2020-11-14 NOTE — ED Provider Notes (Signed)
[facility] EMERGENCY DEPARTMENT Provider Note   CSN: [id number] Arrival date & time: 11/14/20  1726     History Chief Complaint  Patient presents with   Arm Injury    [name] is a 60 y.o. female with PMH A. fib on Eliquis who presents the emergency department for evaluation of right arm pain.  Patient states that she was at work, when the chair started to slide out from under her and she forcefully pulled the chair back onto her hearing a pop in the right arm.  She now presents with a noticeable swelling that is tender to palpation at the distal insertion point of the right bicep.  Numbness, tingling, weakness of the upper extremity.  Patient has full range of motion of the shoulder but it is limited by pain.   Arm Injury Associated symptoms: no back pain and no fever       Past Medical History:  Diagnosis Date   Abscess of bladder 07/28/2016   Alcohol abuse 07/28/2016   abuse- moderate years ago - states only drinks now on weekends -2 to 8 driinks    Alcohol dependence ([place])    Allergic rhinitis 12/06/2013   Asthma    Asthma 03/16/2015   Atrial fibrillation with RVR ([place]) 04/01/2020   Atrial flutter with rapid ventricular response ([place]) 04/01/2020   COLONIC POLYPS, HX OF 04/05/2010   DIVERTICULITIS, HX OF 04/05/2010   DJD (degenerative joint disease)    right knee, mot to severe   GERD (gastroesophageal reflux disease)    no meds   Heart murmur    hx of    Hyperlipidemia    Hypertension    Impaired glucose tolerance 12/06/2013   Morbid obesity with BMI of 45.0-49.9, adult ([place])    Obesity, Class III, BMI 40-49.9 (morbid obesity) ([place]) 10/03/2014   PALPITATIONS, HX OF 09/14/2007    Patient Active Problem List   Diagnosis Date Noted   Positive blood cultures    Asthma exacerbation 07/22/2020   Hyperkalemia 07/22/2020   Atrial fibrillation with RVR ([place]) 04/01/2020   Atrial flutter with rapid ventricular response ([place]) 04/01/2020   Alcohol  dependence with withdrawal ([place]) 12/19/2019   History of pulmonary embolism 12/19/2019   Pneumonia due to COVID-19 virus 02/10/2019   Pulmonary embolism ([place]) 07/06/2018   Leukocytosis 09/22/2017   Rash 09/22/2017   Sepsis ([place]) 09/12/2016   Colovesical fistula 09/12/2016   Alcohol abuse 07/28/2016   Colonic diverticulum 07/28/2016   Hyperlipidemia 07/10/2016   Mass of left side of neck 07/10/2016   Head and neck lymphadenopathy 07/10/2016   Eustachian tube disorder 01/25/2016   Peripheral edema 03/16/2015   Asthma 03/16/2015   Right shoulder pain 03/16/2015   Hypokalemia 10/04/2014   Upper airway cough syndrome 10/03/2014   Obesity, Class III, BMI 40-49.9 (morbid obesity) ([place]) 10/03/2014   Lower back pain 05/25/2014   Bilateral shoulder pain 05/25/2014   Allergic rhinitis 12/06/2013   Impaired glucose tolerance 12/06/2013   Expected blood loss anemia 12/16/2012   S/P left TKA 12/14/2012   Goiter 11/26/2012   Preop exam for internal medicine 11/26/2012   Vaginal bleeding 04/30/2011   Colon polyps 02/10/2011   Eczema 02/10/2011   Depression 02/10/2011   Encounter for well adult exam with abnormal findings 09/27/2010   Anxiety state 04/05/2010   DIVERTICULITIS, HX OF 04/05/2010   PALPITATIONS, HX OF 09/14/2007   Essential hypertension 04/24/2007   VOCAL CORD DISORDER 04/24/2007   Extrinsic asthma 04/24/2007  GERD 04/24/2007    Past Surgical History:  Procedure Laterality Date   ABDOMINAL HYSTERECTOMY  age 15   fibroids   COLONOSCOPY WITH PROPOFOL N/A 07/25/2016   Procedure: COLONOSCOPY WITH PROPOFOL;  Surgeon: Carol Ada, MD;  Location: [facility];  Service: Endoscopy;  Laterality: N/A;   colonscopy     x 2   IR RADIOLOGIST EVAL & MGMT  08/12/2016   IR RADIOLOGIST EVAL & MGMT  08/21/2016   KNEE ARTHROSCOPY     left    TOTAL KNEE ARTHROPLASTY  07/29/2011   Procedure: TOTAL KNEE ARTHROPLASTY;  Surgeon: Mauri Pole, MD;  Location: [facility];  Service: Orthopedics;   Laterality: Right;   TOTAL KNEE ARTHROPLASTY Left 12/14/2012   Procedure: LEFT TOTAL KNEE ARTHROPLASTY;  Surgeon: Mauri Pole, MD;  Location: [facility];  Service: Orthopedics;  Laterality: Left;     OB History     Gravida  1   Para  1   Term      Preterm      AB      Living  1      SAB      IAB      Ectopic      Multiple      Live Births              Family History  Problem Relation Age of Onset   Stroke Mother    COPD Father    Lymphoma Sister     Social History   Tobacco Use   Smoking status: Former    Packs/day: 0.50    Years: 7.00    Pack years: 3.50    Types: Cigarettes    Quit date: 02/11/1988    Years since quitting: 32.7   Smokeless tobacco: Never  Vaping Use   Vaping Use: Never used  Substance Use Topics   Alcohol use: Not Currently    Comment: dependence(5weeks without)   Drug use: Not Currently    Types: Marijuana    Comment: smoked marijuana x 10 years.  Quit in 1985.    Home Medications Prior to Admission medications   Medication Sig Start Date End Date Taking? Authorizing Provider  HYDROcodone-acetaminophen (NORCO/VICODIN) 5-325 MG tablet Take 1 tablet by mouth every 4 (four) hours as needed. 11/14/20  Yes Karlisa Gaubert, MD  albuterol (VENTOLIN HFA) 108 (90 Base) MCG/ACT inhaler 2 puffs up to four times per day as needed 10/11/20   Biagio Borg, MD  alum & mag hydroxide-simeth (MAALOX/MYLANTA) 200-200-20 MG/5ML suspension Take by mouth every 6 (six) hours as needed for indigestion or heartburn.    [provider]  apixaban (ELIQUIS) 5 MG TABS tablet Take 1 tablet (5 mg total) by mouth 2 (two) times daily. 10/17/20   Biagio Borg, MD  atorvastatin (LIPITOR) 20 MG tablet Take 1 tablet (20 mg total) by mouth daily. 07/20/20 07/20/21  Biagio Borg, MD  cimetidine (TAGAMET) 200 MG tablet Take 200 mg by mouth every other day.    [provider]  losartan-hydrochlorothiazide (HYZAAR) 100-25 MG tablet Take 1 tablet by mouth  daily. 07/20/20   Biagio Borg, MD  metoprolol succinate (TOPROL-XL) 25 MG 24 hr tablet TAKE 1 TABLET BY MOUTH EVERYDAY AT BEDTIME 08/09/20   Sherran Needs, NP  ondansetron (ZOFRAN ODT) 4 MG disintegrating tablet Take 1 tablet (4 mg total) by mouth every 8 (eight) hours as needed for nausea or vomiting. 10/17/20   Biagio Borg, MD  potassium chloride (KLOR-CON) 10 MEQ tablet Take 1 tablet (10 mEq total) by mouth 2 (two) times daily. 07/30/20   Biagio Borg, MD  triamcinolone cream (KENALOG) 0.1 % APPLY TO AFFECTED AREA TWICE A DAY 11/09/20   Biagio Borg, MD    Allergies    Morphine, Shrimp [shellfish allergy], Tramadol, Diltiazem hcl, Other, and Latex  Review of Systems   Review of Systems  Constitutional:  Negative for chills and fever.  HENT:  Negative for ear pain and sore throat.   Eyes:  Negative for pain and visual disturbance.  Respiratory:  Negative for cough and shortness of breath.   Cardiovascular:  Negative for chest pain and palpitations.  Gastrointestinal:  Negative for abdominal pain and vomiting.  Genitourinary:  Negative for dysuria and hematuria.  Musculoskeletal:  Positive for arthralgias and myalgias. Negative for back pain.  Skin:  Negative for color change and rash.  Neurological:  Negative for seizures and syncope.  All other systems reviewed and are negative.  Physical Exam Updated Vital Signs BP (!) 143/95 (BP Location: Left Arm)   Pulse 85   Temp 98.4 F (36.9 C) (Oral)   Resp 18   Ht 5\' 2"  (1.575 m)   Wt 127 kg   SpO2 97%   BMI 51.21 kg/m   Physical Exam Vitals and nursing note reviewed.  Constitutional:      General: She is not in acute distress.    Appearance: She is well-developed.  HENT:     Head: Normocephalic and atraumatic.  Eyes:     Conjunctiva/sclera: Conjunctivae normal.  Cardiovascular:     Rate and Rhythm: Normal rate and regular rhythm.     Heart sounds: No murmur heard. Pulmonary:     Effort: Pulmonary effort is normal. No  respiratory distress.     Breath sounds: Normal breath sounds.  Abdominal:     Palpations: Abdomen is soft.     Tenderness: There is no abdominal tenderness.  Musculoskeletal:        General: Swelling, tenderness (Distal insertion point of the right bicep) and deformity present.     Cervical back: Neck supple.  Skin:    General: Skin is warm and dry.  Neurological:     Mental Status: She is alert.    ED Results / Procedures / Treatments   Labs (all labs ordered are listed, but only abnormal results are displayed) Labs Reviewed - No data to display  EKG None  Radiology DG Humerus Right  Result Date: 11/14/2020 CLINICAL DATA:  Pain and swelling. Right upper arm. Status post fall EXAM: RIGHT HUMERUS - 2+ VIEW COMPARISON:  None. FINDINGS: There is no evidence of fracture or other focal bone lesions. Soft tissues are unremarkable. IMPRESSION: Negative. Electronically Signed   By: Iven Finn M.D.   On: 11/14/2020 19:28    Procedures Procedures   Medications Ordered in ED Medications  HYDROcodone-acetaminophen (NORCO/VICODIN) 5-325 MG per tablet 1 tablet (1 tablet Oral Given 11/14/20 2016)    ED Course  I have reviewed the triage vital signs and the nursing notes.  Pertinent labs & imaging results that were available during my care of the patient were reviewed by me and considered in my medical decision making (see chart for details).    MDM Rules/Calculators/A&P                         Patient seen emergency department for evaluation of right arm pain.  Physical  exam reveals a tender swelling at the distal insertion point of the right bicep proximal to the antecubital fossa.  Bedside ultrasound reveals that the swelling is not a hematoma but contains primarily muscle tissue.  X-ray with no fracture.  Patient presentation consistent with a tear likely partial.  She was placed in a sling and given a short prescription for pain medication.  She was given the number for  outpatient sports medicine for follow-up and she will likely require an outpatient MRI.  At this time she is safe for discharge, she was given return precautions which she voiced understanding and she was discharged.   Final Clinical Impression(s) / ED Diagnoses Final diagnoses:  Pain of right upper extremity    Rx / DC Orders ED Discharge Orders          Ordered    HYDROcodone-acetaminophen (NORCO/VICODIN) 5-325 MG tablet  Every 4 hours PRN        11/14/20 2037             Teressa Lower, MD 11/14/20 2047

## 2020-11-14 NOTE — Discharge Instructions (Addendum)
You were seen in the emergency department for evaluation of a right arm injury.  Your x-ray showed no fracture today.  I am concerned that you may have torn your bicep and you will require outpatient follow-up with a sports medicine physician.  Please call the number above to schedule your appointment for next week.  In the meantime, we will provide a sling today to wear for comfort.  Please keep your arm in the sling during the day and take it off at night.  Please takeTylenol for your pain.  At this time you are safe for discharge and return the emergency department if you have new or worsening pain in the arm, fevers, loss of function of the arm, numbness or tingling of the arm or any other concerning symptoms.

## 2020-11-16 DIAGNOSIS — S46219A Strain of muscle, fascia and tendon of other parts of biceps, unspecified arm, initial encounter: Secondary | ICD-10-CM | POA: Insufficient documentation

## 2020-12-02 IMAGING — CR CHEST - 2 VIEW
2 series · 2 of 2 positions shown · non-contrast
Comparison: None.

CLINICAL DATA: Chest discomfort

EXAM:
CHEST - 2 VIEW

[chest pa]
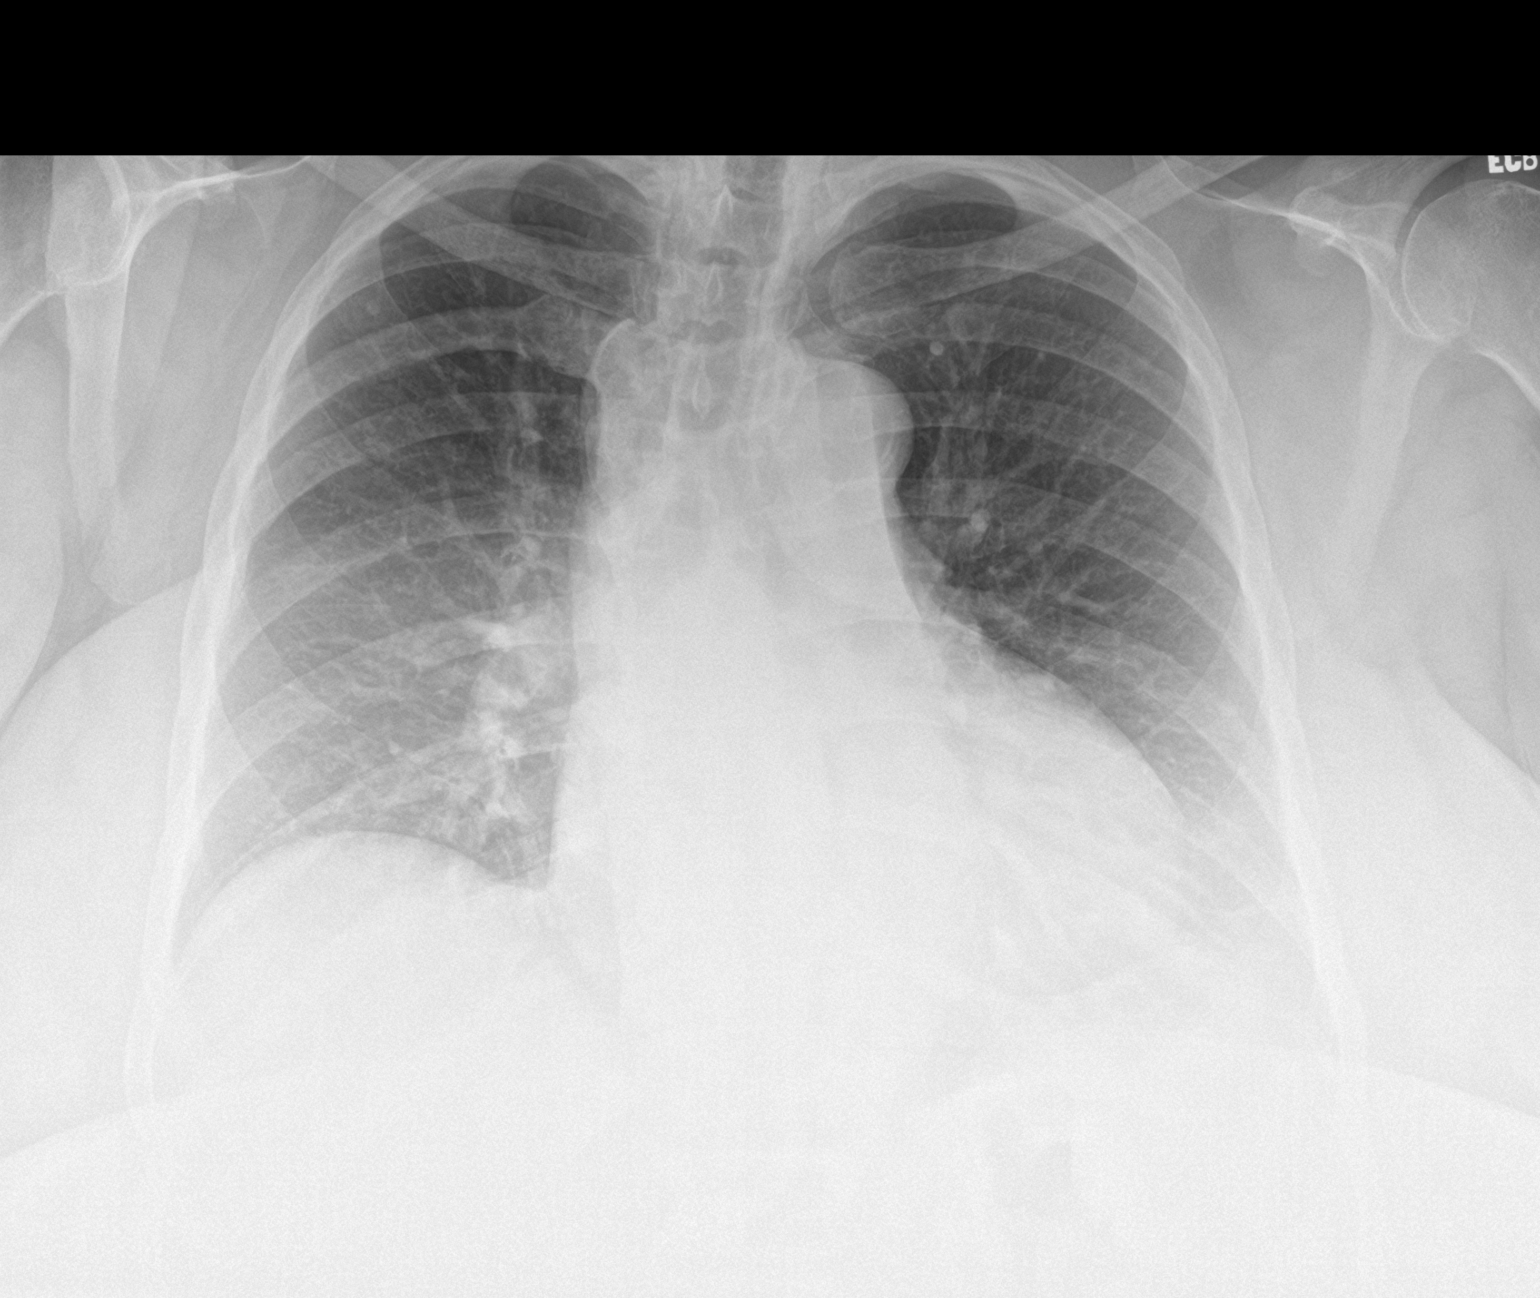

[chest lat]
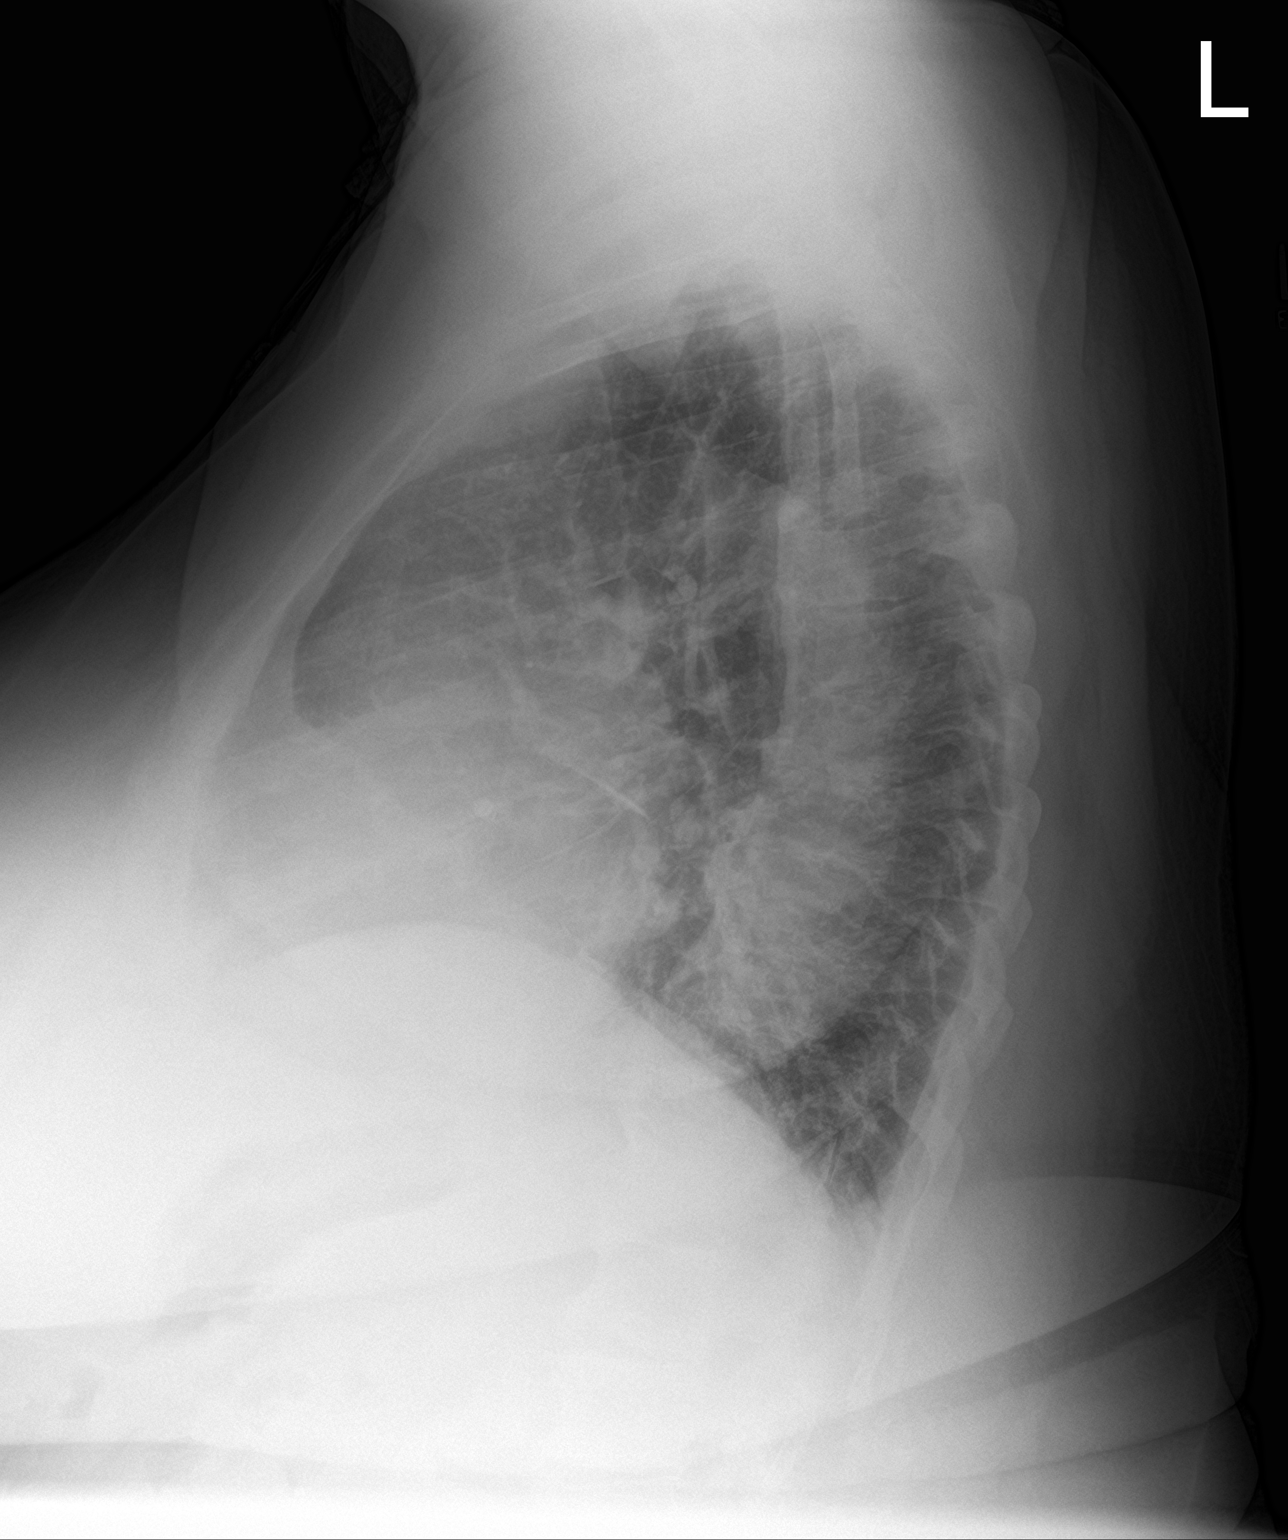

[2 of 2 positions shown; findings below may reference images not displayed]

FINDINGS: The heart size and mediastinal contours are within normal limits.
Both lungs are clear. The visualized skeletal structures are
unremarkable.
IMPRESSION: No active cardiopulmonary disease.

## 2020-12-19 ENCOUNTER — Encounter: Payer: Self-pay | Admitting: Internal Medicine

## 2020-12-19 MED ORDER — HYDROCODONE BIT-HOMATROP MBR 5-1.5 MG/5ML PO SOLN: 5.0000 mL | Freq: Four times a day (QID) | ORAL | 0 refills | Status: AC | PRN

## 2020-12-25 ENCOUNTER — Telehealth: Payer: Self-pay | Admitting: Internal Medicine

## 2020-12-25 MED ORDER — PREDNISONE 10 MG PO TABS: ORAL_TABLET | ORAL | 0 refills | Status: DC

## 2020-12-25 MED ORDER — AZITHROMYCIN 250 MG PO TABS: ORAL_TABLET | ORAL | 0 refills | Status: DC

## 2020-12-25 NOTE — Telephone Encounter (Signed)
Zpak Prednisone 10 mg take  4 each am x 2 days,   2 each am x 2 days,  1 each am x 2 days and stop Delsym otc is the strongest non narcotic cough syrup she can get

## 2020-12-25 NOTE — Telephone Encounter (Signed)
Spoke with the pt  She is coughing for the past wk  Started with runny nose and sinus drainage and now it has moved to her chest  In the am she coughs up grey/green sputum and then it's yellow later in the day  She denies any wheezing or increased SOB  She has been taking robitussin DM without relief  Cough is keeping her from sleeping well  She denies any fevers or aches, has had all covid vaccines and tested neg 3 days ago  She states that she has not had flu vax  Pt without insurance until after Dec 2022 so she is unable to come for ov  Please advise thanks!  Allergies  Allergen Reactions   Morphine Itching   Shrimp [Shellfish Allergy] Itching and Other (See Comments)    Tongue burns also   Tramadol Other (See Comments)    Caused confusion   Diltiazem Hcl Itching    Pt with itching of the feet when bolus given   Other Other (See Comments)   Latex Rash

## 2020-12-25 NOTE — Telephone Encounter (Signed)
I called and spoke with the pt She is awaf MW's response  Rxs were sent  She will call if not improving

## 2020-12-28 ENCOUNTER — Telehealth: Payer: Self-pay | Admitting: Internal Medicine

## 2020-12-28 MED ORDER — GUAIFENESIN ER 600 MG PO TB12: 1200.0000 mg | ORAL_TABLET | Freq: Two times a day (BID) | ORAL | 1 refills | Status: DC | PRN

## 2020-12-28 NOTE — Telephone Encounter (Signed)
Ok also for mucinex bid prn  - done erx

## 2020-12-28 NOTE — Telephone Encounter (Signed)
Patient calling in  Says rx prescribed is helping w/ her cough but it is not breaking up the mucus in her chest  Would like callback from nurse [phone]

## 2020-12-31 MED ORDER — ALBUTEROL SULFATE HFA 108 (90 BASE) MCG/ACT IN AERS: INHALATION_SPRAY | RESPIRATORY_TRACT | 2 refills | Status: DC

## 2020-12-31 NOTE — Telephone Encounter (Signed)
Patient notified and states that she appreciates everything that has been done to treat her

## 2020-12-31 NOTE — Telephone Encounter (Signed)
Patient states that she is has already been taking Mucinex and this has not helped with "breaking up the mucus" in her chest. Per patient, unable to come for OV because her insurance does not start until the first of the new year

## 2020-12-31 NOTE — Telephone Encounter (Signed)
Unfortunately I dont really have anything else besides the antibiotic she received nov 15 and the mucinex  I can send an inhaler though, that may help

## 2021-01-15 ENCOUNTER — Encounter: Payer: Self-pay | Admitting: Internal Medicine

## 2021-02-15 ENCOUNTER — Ambulatory Visit (HOSPITAL_COMMUNITY)
Admission: EM | Admit: 2021-02-15 | Discharge: 2021-02-15 | Disposition: A | Discharge status: Home / Self Care | Payer: 59 | Attending: Family Medicine | Admitting: Family Medicine

## 2021-02-15 ENCOUNTER — Encounter (HOSPITAL_COMMUNITY): Payer: Self-pay | Admitting: Emergency Medicine

## 2021-02-15 ENCOUNTER — Other Ambulatory Visit: Payer: Self-pay

## 2021-02-15 DIAGNOSIS — K219 Gastro-esophageal reflux disease without esophagitis: Secondary | ICD-10-CM | POA: Diagnosis not present

## 2021-02-15 DIAGNOSIS — R319 Hematuria, unspecified: Secondary | ICD-10-CM

## 2021-02-15 LAB — POCT URINALYSIS DIPSTICK, ED / UC
Glucose, UA: NEGATIVE mg/dL
Nitrite: NEGATIVE
Protein, ur: 30 mg/dL — AB
Specific Gravity, Urine: 1.015 (ref 1.005–1.030)
Urobilinogen, UA: 0.2 mg/dL (ref 0.0–1.0)
pH: 5.5 (ref 5.0–8.0)

## 2021-02-15 MED ORDER — OMEPRAZOLE 20 MG PO CPDR: 20.0000 mg | DELAYED_RELEASE_CAPSULE | Freq: Every day | ORAL | 0 refills | Status: DC

## 2021-02-15 NOTE — ED Provider Notes (Signed)
[facility]    CSN: [id number] Arrival date & time: 02/15/21  [place]      History   Chief Complaint Chief Complaint  Patient presents with   Hematuria    HPI [name] is a 61 y.o. female.    Hematuria  Here for h/o brown and then red color to her urine today. No dysuria or frequency. No n/v/d. No flank pain.  No f/c.  She also has had some heartburn, burning in her chest esp after ginger ale and is taking some medication like cimetidine.  Just restarted taking eliquis; had been off it for 4 months; was on it for PE. Past Medical History:  Diagnosis Date   Abscess of bladder 07/28/2016   Alcohol abuse 07/28/2016   abuse- moderate years ago - states only drinks now on weekends -2 to 8 driinks    Alcohol dependence ([place])    Allergic rhinitis 12/06/2013   Asthma    Asthma 03/16/2015   Atrial fibrillation with RVR ([place]) 04/01/2020   Atrial flutter with rapid ventricular response ([place]) 04/01/2020   COLONIC POLYPS, HX OF 04/05/2010   DIVERTICULITIS, HX OF 04/05/2010   DJD (degenerative joint disease)    right knee, mot to severe   GERD (gastroesophageal reflux disease)    no meds   Heart murmur    hx of    Hyperlipidemia    Hypertension    Impaired glucose tolerance 12/06/2013   Morbid obesity with BMI of 45.0-49.9, adult ([place])    Obesity, Class III, BMI 40-49.9 (morbid obesity) ([place]) 10/03/2014   PALPITATIONS, HX OF 09/14/2007    Patient Active Problem List   Diagnosis Date Noted   Positive blood cultures    Asthma exacerbation 07/22/2020   Hyperkalemia 07/22/2020   Atrial fibrillation with RVR ([place]) 04/01/2020   Atrial flutter with rapid ventricular response ([place]) 04/01/2020   Alcohol dependence with withdrawal ([place]) 12/19/2019   History of pulmonary embolism 12/19/2019   Pneumonia due to COVID-19 virus 02/10/2019   Pulmonary embolism ([place]) 07/06/2018   Leukocytosis 09/22/2017   Rash 09/22/2017   Sepsis ([place]) 09/12/2016   Colovesical fistula  09/12/2016   Alcohol abuse 07/28/2016   Colonic diverticulum 07/28/2016   Hyperlipidemia 07/10/2016   Mass of left side of neck 07/10/2016   Head and neck lymphadenopathy 07/10/2016   Eustachian tube disorder 01/25/2016   Peripheral edema 03/16/2015   Asthma 03/16/2015   Right shoulder pain 03/16/2015   Hypokalemia 10/04/2014   Upper airway cough syndrome 10/03/2014   Obesity, Class III, BMI 40-49.9 (morbid obesity) ([place]) 10/03/2014   Lower back pain 05/25/2014   Bilateral shoulder pain 05/25/2014   Allergic rhinitis 12/06/2013   Impaired glucose tolerance 12/06/2013   Expected blood loss anemia 12/16/2012   S/P left TKA 12/14/2012   Goiter 11/26/2012   Preop exam for internal medicine 11/26/2012   Vaginal bleeding 04/30/2011   Colon polyps 02/10/2011   Eczema 02/10/2011   Depression 02/10/2011   Encounter for well adult exam with abnormal findings 09/27/2010   Anxiety state 04/05/2010   DIVERTICULITIS, HX OF 04/05/2010   PALPITATIONS, HX OF 09/14/2007   Essential hypertension 04/24/2007   VOCAL CORD DISORDER 04/24/2007   Extrinsic asthma 04/24/2007   GERD 04/24/2007    Past Surgical History:  Procedure Laterality Date   ABDOMINAL HYSTERECTOMY  age 15   fibroids   COLONOSCOPY WITH PROPOFOL N/A 07/25/2016   Procedure: COLONOSCOPY WITH PROPOFOL;  Surgeon: Carol Ada, MD;  [facility];  Service:  Endoscopy;  Laterality: N/A;   colonscopy     x 2   IR RADIOLOGIST EVAL & MGMT  08/12/2016   IR RADIOLOGIST EVAL & MGMT  08/21/2016   KNEE ARTHROSCOPY     left    TOTAL KNEE ARTHROPLASTY  07/29/2011   Procedure: TOTAL KNEE ARTHROPLASTY;  Surgeon: Mauri Pole, MD;  Location: [facility];  Service: Orthopedics;  Laterality: Right;   TOTAL KNEE ARTHROPLASTY Left 12/14/2012   Procedure: LEFT TOTAL KNEE ARTHROPLASTY;  Surgeon: Mauri Pole, MD;  Location: [facility];  Service: Orthopedics;  Laterality: Left;    OB History     Gravida  1   Para  1   Term      Preterm       AB      Living  1      SAB      IAB      Ectopic      Multiple      Live Births               Home Medications    Prior to Admission medications   Medication Sig Start Date End Date Taking? Authorizing Provider  omeprazole (PRILOSEC) 20 MG capsule Take 1 capsule (20 mg total) by mouth daily. 02/15/21  Yes Barrett Henle, MD  albuterol (VENTOLIN HFA) 108 (90 Base) MCG/ACT inhaler 2 puffs up to four times per day as needed 12/31/20   Biagio Borg, MD  alum & mag hydroxide-simeth (MAALOX/MYLANTA) 200-200-20 MG/5ML suspension Take by mouth every 6 (six) hours as needed for indigestion or heartburn.    [provider]  apixaban (ELIQUIS) 5 MG TABS tablet Take 1 tablet (5 mg total) by mouth 2 (two) times daily. 10/17/20   Biagio Borg, MD  atorvastatin (LIPITOR) 20 MG tablet Take 1 tablet (20 mg total) by mouth daily. 07/20/20 07/20/21  Biagio Borg, MD  cimetidine (TAGAMET) 200 MG tablet Take 200 mg by mouth every other day.    [provider]  guaiFENesin (MUCINEX) 600 MG 12 hr tablet Take 2 tablets (1,200 mg total) by mouth 2 (two) times daily as needed. 12/28/20   Biagio Borg, MD  losartan-hydrochlorothiazide (HYZAAR) 100-25 MG tablet Take 1 tablet by mouth daily. 07/20/20   Biagio Borg, MD  metoprolol succinate (TOPROL-XL) 25 MG 24 hr tablet TAKE 1 TABLET BY MOUTH EVERYDAY AT BEDTIME 08/09/20   Sherran Needs, NP  ondansetron (ZOFRAN ODT) 4 MG disintegrating tablet Take 1 tablet (4 mg total) by mouth every 8 (eight) hours as needed for nausea or vomiting. 10/17/20   Biagio Borg, MD  potassium chloride (KLOR-CON) 10 MEQ tablet Take 1 tablet (10 mEq total) by mouth 2 (two) times daily. 07/30/20   Biagio Borg, MD  triamcinolone cream (KENALOG) 0.1 % APPLY TO AFFECTED AREA TWICE A DAY 11/09/20   Biagio Borg, MD    Family History Family History  Problem Relation Age of Onset   Stroke Mother    COPD Father    Lymphoma Sister     Social  History Social History   Tobacco Use   Smoking status: Former    Packs/day: 0.50    Years: 7.00    Pack years: 3.50    Types: Cigarettes    Quit date: 02/11/1988    Years since quitting: 33.0   Smokeless tobacco: Never  Vaping Use   Vaping Use: Never used  Substance Use Topics  Alcohol use: Not Currently    Comment: dependence(5weeks without)   Drug use: Not Currently    Types: Marijuana    Comment: smoked marijuana x 10 years.  Quit in 1985.     Allergies   Morphine, Shrimp [shellfish allergy], Tramadol, Diltiazem hcl, Other, and Latex   Review of Systems Review of Systems  Genitourinary:  Positive for hematuria.    Physical Exam Triage Vital Signs ED Triage Vitals  Enc Vitals Group     BP 02/15/21 1726 131/83     Pulse Rate 02/15/21 1726 87     Resp 02/15/21 1726 19     Temp 02/15/21 1726 98.9 F (37.2 C)     Temp Source 02/15/21 1726 Oral     SpO2 02/15/21 1726 97 %     Weight --      Height --      Head Circumference --      Peak Flow --      Pain Score 02/15/21 1723 0     Pain Loc --      Pain Edu? --      Excl. in [place]? --    No data found.  Updated Vital Signs BP 131/83    Pulse 87    Temp 98.9 F (37.2 C) (Oral)    Resp 19    SpO2 97%   Visual Acuity Right Eye Distance:   Left Eye Distance:   Bilateral Distance:    Right Eye Near:   Left Eye Near:    Bilateral Near:     Physical Exam Constitutional:      General: She is not in acute distress.    Appearance: She is not toxic-appearing.  HENT:     Right Ear: Tympanic membrane normal.     Left Ear: Tympanic membrane normal.     Mouth/Throat:     Mouth: Mucous membranes are moist.  Eyes:     Extraocular Movements: Extraocular movements intact.     Pupils: Pupils are equal, round, and reactive to light.  Cardiovascular:     Rate and Rhythm: Normal rate and regular rhythm.     Heart sounds: No murmur heard. Pulmonary:     Breath sounds: No wheezing, rhonchi or rales.  Abdominal:      Palpations: Abdomen is soft. There is no mass.     Tenderness: There is no abdominal tenderness.  Musculoskeletal:     Cervical back: Neck supple.  Lymphadenopathy:     Cervical: No cervical adenopathy.  Skin:    Capillary Refill: Capillary refill takes less than 2 seconds.     Coloration: Skin is not jaundiced or pale.  Neurological:     General: No focal deficit present.     Mental Status: She is alert and oriented to person, place, and time.  Psychiatric:        Behavior: Behavior normal.     UC Treatments / Results  Labs (all labs ordered are listed, but only abnormal results are displayed) Labs Reviewed  POCT URINALYSIS DIPSTICK, ED / UC - Abnormal; Notable for the following components:      Result Value   Bilirubin Urine SMALL (*)    Ketones, ur TRACE (*)    Hgb urine dipstick LARGE (*)    Protein, ur 30 (*)    Leukocytes,Ua TRACE (*)    All other components within normal limits  URINE CULTURE    EKG   Radiology No results found.  Procedures Procedures (including critical care  time)  Medications Ordered in UC Medications - No data to display  Initial Impression / Assessment and Plan / UC Course  I have reviewed the triage vital signs and the nursing notes.  Pertinent labs & imaging results that were available during my care of the patient were reviewed by me and considered in my medical decision making (see chart for details).     She will contact her regular doctor for further testing. UA shows lg amount of blood. Will treat the GERD symptoms too Final Clinical Impressions(s) / UC Diagnoses   Final diagnoses:  Hematuria, unspecified type  Gastroesophageal reflux disease without esophagitis     Discharge Instructions      Omeprazole 20 mg 1 daily for heartburn/reflux  Culture was sent of your urine to make sure there is no infection.  Contact your regular doctor to make an appointment. They may order imaging tests.     ED Prescriptions      Medication Sig Dispense Auth. Provider   omeprazole (PRILOSEC) 20 MG capsule Take 1 capsule (20 mg total) by mouth daily. 30 capsule Barrett Henle, MD      I have reviewed the PDMP during this encounter.   Barrett Henle, MD 02/15/21 1755

## 2021-02-15 NOTE — Discharge Instructions (Signed)
Omeprazole 20 mg 1 daily for heartburn/reflux  Culture was sent of your urine to make sure there is no infection.  Contact your regular doctor to make an appointment. They may order imaging tests.

## 2021-02-15 NOTE — ED Triage Notes (Signed)
Pt reports noticed reddish color in urine today. Denies dysuria.

## 2021-02-17 LAB — URINE CULTURE: Culture: NO GROWTH

## 2021-02-18 ENCOUNTER — Encounter: Payer: Self-pay | Admitting: Internal Medicine

## 2021-02-18 DIAGNOSIS — R310 Gross hematuria: Secondary | ICD-10-CM

## 2021-03-01 ENCOUNTER — Telehealth: Payer: Self-pay | Admitting: Internal Medicine

## 2021-03-01 DIAGNOSIS — E559 Vitamin D deficiency, unspecified: Secondary | ICD-10-CM

## 2021-03-01 DIAGNOSIS — E538 Deficiency of other specified B group vitamins: Secondary | ICD-10-CM

## 2021-03-01 DIAGNOSIS — E7800 Pure hypercholesterolemia, unspecified: Secondary | ICD-10-CM

## 2021-03-01 DIAGNOSIS — R7302 Impaired glucose tolerance (oral): Secondary | ICD-10-CM

## 2021-03-01 NOTE — Telephone Encounter (Signed)
Patient requesting order for labs prior to cpe appt on 03-20-2021

## 2021-03-04 NOTE — Telephone Encounter (Signed)
[place] lab orders done

## 2021-03-04 NOTE — Telephone Encounter (Signed)
Patient notified

## 2021-03-11 ENCOUNTER — Other Ambulatory Visit: Payer: Self-pay

## 2021-03-11 ENCOUNTER — Other Ambulatory Visit: Payer: Self-pay | Admitting: Internal Medicine

## 2021-03-11 ENCOUNTER — Other Ambulatory Visit (INDEPENDENT_AMBULATORY_CARE_PROVIDER_SITE_OTHER): Payer: 59

## 2021-03-11 DIAGNOSIS — E7800 Pure hypercholesterolemia, unspecified: Secondary | ICD-10-CM | POA: Diagnosis not present

## 2021-03-11 DIAGNOSIS — E559 Vitamin D deficiency, unspecified: Secondary | ICD-10-CM | POA: Diagnosis not present

## 2021-03-11 DIAGNOSIS — E538 Deficiency of other specified B group vitamins: Secondary | ICD-10-CM

## 2021-03-11 DIAGNOSIS — R7302 Impaired glucose tolerance (oral): Secondary | ICD-10-CM | POA: Diagnosis not present

## 2021-03-11 LAB — CBC WITH DIFFERENTIAL/PLATELET
Basophils Absolute: 0.1 10*3/uL (ref 0.0–0.1)
Basophils Relative: 0.4 % (ref 0.0–3.0)
Eosinophils Absolute: 0.1 10*3/uL (ref 0.0–0.7)
Eosinophils Relative: 1.2 % (ref 0.0–5.0)
HCT: 42 % (ref 36.0–46.0)
Hemoglobin: 13.7 g/dL (ref 12.0–15.0)
Lymphocytes Relative: 11.1 % — ABNORMAL LOW (ref 12.0–46.0)
Lymphs Abs: 1.3 10*3/uL (ref 0.7–4.0)
MCHC: 32.7 g/dL (ref 30.0–36.0)
MCV: 94.9 fl (ref 78.0–100.0)
Monocytes Absolute: 0.7 10*3/uL (ref 0.1–1.0)
Monocytes Relative: 6.1 % (ref 3.0–12.0)
Neutro Abs: 9.6 10*3/uL — ABNORMAL HIGH (ref 1.4–7.7)
Neutrophils Relative %: 81.2 % — ABNORMAL HIGH (ref 43.0–77.0)
Platelets: 253 10*3/uL (ref 150.0–400.0)
RBC: 4.43 Mil/uL (ref 3.87–5.11)
RDW: 14.7 % (ref 11.5–15.5)
WBC: 11.8 10*3/uL — ABNORMAL HIGH (ref 4.0–10.5)

## 2021-03-11 LAB — LIPID PANEL
Cholesterol: 168 mg/dL (ref 0–200)
HDL: 61.5 mg/dL (ref >39.00)
LDL Cholesterol: 90 mg/dL (ref 0–99)
NonHDL: 106.37
Total CHOL/HDL Ratio: 3
Triglycerides: 81 mg/dL (ref 0.0–149.0)
VLDL: 16.2 mg/dL (ref 0.0–40.0)

## 2021-03-11 LAB — URINALYSIS, ROUTINE W REFLEX MICROSCOPIC
Bilirubin Urine: NEGATIVE
Ketones, ur: NEGATIVE
Leukocytes,Ua: NEGATIVE
Nitrite: NEGATIVE
Specific Gravity, Urine: 1.02 (ref 1.000–1.030)
Total Protein, Urine: 30 — AB
Urine Glucose: NEGATIVE
Urobilinogen, UA: 0.2 (ref 0.0–1.0)
pH: 5.5 (ref 5.0–8.0)

## 2021-03-11 LAB — HEPATIC FUNCTION PANEL
ALT: 60 U/L — ABNORMAL HIGH (ref 0–35)
AST: 59 U/L — ABNORMAL HIGH (ref 0–37)
Albumin: 3.7 g/dL (ref 3.5–5.2)
Alkaline Phosphatase: 110 U/L (ref 39–117)
Bilirubin, Direct: 0.1 mg/dL (ref 0.0–0.3)
Total Bilirubin: 0.6 mg/dL (ref 0.2–1.2)
Total Protein: 7.5 g/dL (ref 6.0–8.3)

## 2021-03-11 LAB — TSH: TSH: 3.34 u[IU]/mL (ref 0.35–5.50)

## 2021-03-11 LAB — VITAMIN B12: Vitamin B-12: 316 pg/mL (ref 211–911)

## 2021-03-11 LAB — BASIC METABOLIC PANEL
BUN: 17 mg/dL (ref 6–23)
CO2: 31 mEq/L (ref 19–32)
Calcium: 9.4 mg/dL (ref 8.4–10.5)
Chloride: 99 mEq/L (ref 96–112)
Creatinine, Ser: 0.88 mg/dL (ref 0.40–1.20)
GFR: 71.67 mL/min (ref >60.00)
Glucose, Bld: 122 mg/dL — ABNORMAL HIGH (ref 70–99)
Potassium: 3.7 mEq/L (ref 3.5–5.1)
Sodium: 141 mEq/L (ref 135–145)

## 2021-03-11 LAB — HEMOGLOBIN A1C: Hgb A1c MFr Bld: 6.2 % (ref 4.6–6.5)

## 2021-03-11 LAB — VITAMIN D 25 HYDROXY (VIT D DEFICIENCY, FRACTURES): VITD: 9.99 ng/mL — ABNORMAL LOW (ref 30.00–100.00)

## 2021-03-13 ENCOUNTER — Ambulatory Visit (INDEPENDENT_AMBULATORY_CARE_PROVIDER_SITE_OTHER): Payer: 59 | Admitting: Internal Medicine

## 2021-03-13 ENCOUNTER — Other Ambulatory Visit: Payer: Self-pay

## 2021-03-13 ENCOUNTER — Ambulatory Visit (INDEPENDENT_AMBULATORY_CARE_PROVIDER_SITE_OTHER): Payer: 59 | Admitting: Podiatry

## 2021-03-13 VITALS — BP 124/78 | HR 91 | Temp 98.2°F | Ht 62.0 in | Wt 294.0 lb

## 2021-03-13 DIAGNOSIS — R3129 Other microscopic hematuria: Secondary | ICD-10-CM | POA: Diagnosis not present

## 2021-03-13 DIAGNOSIS — B351 Tinea unguium: Secondary | ICD-10-CM | POA: Diagnosis not present

## 2021-03-13 DIAGNOSIS — M79609 Pain in unspecified limb: Secondary | ICD-10-CM

## 2021-03-13 DIAGNOSIS — E7800 Pure hypercholesterolemia, unspecified: Secondary | ICD-10-CM

## 2021-03-13 DIAGNOSIS — E559 Vitamin D deficiency, unspecified: Secondary | ICD-10-CM

## 2021-03-13 DIAGNOSIS — Z0001 Encounter for general adult medical examination with abnormal findings: Secondary | ICD-10-CM

## 2021-03-13 DIAGNOSIS — J45909 Unspecified asthma, uncomplicated: Secondary | ICD-10-CM | POA: Diagnosis not present

## 2021-03-13 DIAGNOSIS — I10 Essential (primary) hypertension: Secondary | ICD-10-CM

## 2021-03-13 DIAGNOSIS — R7302 Impaired glucose tolerance (oral): Secondary | ICD-10-CM

## 2021-03-13 MED ORDER — CHOLECALCIFEROL 50 MCG (2000 UT) PO TABS: ORAL_TABLET | ORAL | 99 refills | Status: DC

## 2021-03-13 MED ORDER — ALBUTEROL SULFATE HFA 108 (90 BASE) MCG/ACT IN AERS: INHALATION_SPRAY | RESPIRATORY_TRACT | 11 refills | Status: DC

## 2021-03-13 MED ORDER — HYDROCODONE BIT-HOMATROP MBR 5-1.5 MG/5ML PO SOLN: 5.0000 mL | Freq: Four times a day (QID) | ORAL | 0 refills | Status: AC | PRN

## 2021-03-13 NOTE — Patient Instructions (Addendum)
Please take OTC Vitamin D3 at 2000 units per day, indefinitely  Please continue all other medications as before, and refills have been done if requested.  Please have the pharmacy call with any other refills you may need.  Please continue your efforts at being more active, low cholesterol diet, and weight control.  You are otherwise up to date with prevention measures today.  Please keep your appointments with your specialists as you may have planned  Please make an Appointment to return in 6 months, or sooner if needed 

## 2021-03-13 NOTE — Progress Notes (Signed)
Patient ID: [name], female   DOB: 09-25-1960, 61 y.o.   MRN: [id number]         Chief Complaint:: wellness exam and chronic cough, low vit d, microhematuria,        HPI:  [name] is a 61 y.o. female here for wellness exam; declines covid booster, flu shot, shinngrix, tdap o/w up to date                        Also not taking Vit D.  Denies urinary symptoms such as dysuria, frequency, urgency, flank pain, hematuria or n/v, fever, chills. Pt has seen urology with CT and cysto approx 5 yrs, declines further f/u now  Pt denies chest pain, increased sob or doe, wheezing, orthopnea, PND, increased LE swelling, palpitations, dizziness or syncope.   Pt denies polydipsia, polyuria, or new focal neuro s/s.   Pt denies fever, wt loss, night sweats, loss of appetite, or other constitutional symptoms   No other new complaints Wt Readings from Last 3 Encounters:  03/13/21 294 lb (133.4 kg)  11/14/20 279 lb 15.8 oz (127 kg)  11/02/20 280 lb (127 kg)   BP Readings from Last 3 Encounters:  03/13/21 124/78  02/15/21 131/83  11/14/20 (!) 143/95   Immunization History  Administered Date(s) Administered   PFIZER(Purple Top)SARS-COV-2 Vaccination 04/25/2019, 05/18/2019, 01/02/2020   Td 02/10/2002   There are no preventive care reminders to display for this patient.     Past Medical History:  Diagnosis Date   Abscess of bladder 07/28/2016   Alcohol abuse 07/28/2016   abuse- moderate years ago - states only drinks now on weekends -2 to 8 driinks    Alcohol dependence ([place])    Allergic rhinitis 12/06/2013   Asthma    Asthma 03/16/2015   Atrial fibrillation with RVR ([place]) 04/01/2020   Atrial flutter with rapid ventricular response ([place]) 04/01/2020   COLONIC POLYPS, HX OF 04/05/2010   DIVERTICULITIS, HX OF 04/05/2010   DJD (degenerative joint disease)    right knee, mot to severe   GERD (gastroesophageal reflux disease)    no meds   Heart murmur    hx of    Hyperlipidemia    Hypertension     Impaired glucose tolerance 12/06/2013   Morbid obesity with BMI of 45.0-49.9, adult ([place])    Obesity, Class III, BMI 40-49.9 (morbid obesity) ([place]) 10/03/2014   PALPITATIONS, HX OF 09/14/2007   Past Surgical History:  Procedure Laterality Date   ABDOMINAL HYSTERECTOMY  age 39   fibroids   COLONOSCOPY WITH PROPOFOL N/A 07/25/2016   Procedure: COLONOSCOPY WITH PROPOFOL;  Surgeon: Carol Ada, MD;  Location: [facility];  Service: Endoscopy;  Laterality: N/A;   colonscopy     x 2   IR RADIOLOGIST EVAL & MGMT  08/12/2016   IR RADIOLOGIST EVAL & MGMT  08/21/2016   KNEE ARTHROSCOPY     left    TOTAL KNEE ARTHROPLASTY  07/29/2011   Procedure: TOTAL KNEE ARTHROPLASTY;  Surgeon: Mauri Pole, MD;  Location: [facility];  Service: Orthopedics;  Laterality: Right;   TOTAL KNEE ARTHROPLASTY Left 12/14/2012   Procedure: LEFT TOTAL KNEE ARTHROPLASTY;  Surgeon: Mauri Pole, MD;  Location: [facility];  Service: Orthopedics;  Laterality: Left;    reports that she quit smoking about 33 years ago. Her smoking use included cigarettes. She has a 3.50 pack-year smoking history. She has never used smokeless tobacco. She reports that she does  not currently use alcohol. She reports that she does not currently use drugs after having used the following drugs: Marijuana. family history includes COPD in her father; Lymphoma in her sister; Stroke in her mother. Allergies  Allergen Reactions   Morphine Itching   Shrimp [Shellfish Allergy] Itching and Other (See Comments)    Tongue burns also   Tramadol Other (See Comments)    Caused confusion   Covid-19 ([place]) Vaccine Hives   Diltiazem Hcl Itching    Pt with itching of the feet when bolus given   Other Other (See Comments)   Latex Rash   Current Outpatient Medications on File Prior to Visit  Medication Sig Dispense Refill   alum & mag hydroxide-simeth (MAALOX/MYLANTA) 200-200-20 MG/5ML suspension Take by mouth every 6 (six) hours as needed for indigestion or  heartburn.     apixaban (ELIQUIS) 5 MG TABS tablet Take 1 tablet (5 mg total) by mouth 2 (two) times daily. 180 tablet 3   atorvastatin (LIPITOR) 20 MG tablet Take 1 tablet (20 mg total) by mouth daily. 90 tablet 3   cimetidine (TAGAMET) 200 MG tablet Take 200 mg by mouth every other day.     guaiFENesin (MUCINEX) 600 MG 12 hr tablet Take 2 tablets (1,200 mg total) by mouth 2 (two) times daily as needed. 60 tablet 1   losartan-hydrochlorothiazide (HYZAAR) 100-25 MG tablet Take 1 tablet by mouth daily. 90 tablet 3   metoprolol succinate (TOPROL-XL) 25 MG 24 hr tablet TAKE 1 TABLET BY MOUTH EVERYDAY AT BEDTIME 30 tablet 6   omeprazole (PRILOSEC) 20 MG capsule Take 1 capsule (20 mg total) by mouth daily. 30 capsule 0   ondansetron (ZOFRAN ODT) 4 MG disintegrating tablet Take 1 tablet (4 mg total) by mouth every 8 (eight) hours as needed for nausea or vomiting. 20 tablet 2   potassium chloride (KLOR-CON) 10 MEQ tablet Take 1 tablet (10 mEq total) by mouth 2 (two) times daily. 90 tablet 3   triamcinolone cream (KENALOG) 0.1 % APPLY TO AFFECTED AREA TWICE A DAY 30 g 1   No current facility-administered medications on file prior to visit.        ROS:  All others reviewed and negative.  Objective        PE:  BP 124/78    Pulse 91    Temp 98.2 F (36.8 C) (Oral)    Ht 5\' 2"  (1.575 m)    Wt 294 lb (133.4 kg)    SpO2 94%    BMI 53.77 kg/m                 Constitutional: Pt appears in NAD               HENT: Head: NCAT.                Right Ear: External ear normal.                 Left Ear: External ear normal.                Eyes: . Pupils are equal, round, and reactive to light. Conjunctivae and EOM are normal               Nose: without d/c or deformity               Neck: Neck supple. Gross normal ROM               Cardiovascular: Normal rate and  regular rhythm.                 Pulmonary/Chest: Effort normal and breath sounds without rales or wheezing.                Abd:  Soft, NT, ND, + BS,  no organomegaly               Neurological: Pt is alert. At baseline orientation, motor grossly intact               Skin: Skin is warm. No rashes, no other new lesions, LE edema - none               Psychiatric: Pt behavior is normal without agitation   Micro: none  Cardiac tracings I have personally interpreted today:  none  Pertinent Radiological findings (summarize): none   Lab Results  Component Value Date   WBC 11.8 (H) 03/11/2021   HGB 13.7 03/11/2021   HCT 42.0 03/11/2021   PLT 253.0 03/11/2021   GLUCOSE 122 (H) 03/11/2021   CHOL 168 03/11/2021   TRIG 81.0 03/11/2021   HDL 61.50 03/11/2021   LDLDIRECT 107.0 02/23/2012   LDLCALC 90 03/11/2021   ALT 60 (H) 03/11/2021   AST 59 (H) 03/11/2021   NA 141 03/11/2021   K 3.7 03/11/2021   CL 99 03/11/2021   CREATININE 0.88 03/11/2021   BUN 17 03/11/2021   CO2 31 03/11/2021   TSH 3.34 03/11/2021   INR 1.11 07/28/2016   HGBA1C 6.2 03/11/2021   Assessment/Plan:  [name] is a 61 y.o. Black or African American [2] female with  has a past medical history of Abscess of bladder (07/28/2016), Alcohol abuse (07/28/2016), Alcohol dependence ([place]), Allergic rhinitis (12/06/2013), Asthma, Asthma (03/16/2015), Atrial fibrillation with RVR ([place]) (04/01/2020), Atrial flutter with rapid ventricular response ([place]) (04/01/2020), COLONIC POLYPS, HX OF (04/05/2010), DIVERTICULITIS, HX OF (04/05/2010), DJD (degenerative joint disease), GERD (gastroesophageal reflux disease), Heart murmur, Hyperlipidemia, Hypertension, Impaired glucose tolerance (12/06/2013), Morbid obesity with BMI of 45.0-49.9, adult ([place]), Obesity, Class III, BMI 40-49.9 (morbid obesity) ([place]) (10/03/2014), and PALPITATIONS, HX OF (09/14/2007).  Vitamin D deficiency Last vitamin D Lab Results  Component Value Date   VD25OH 9.99 (L) 03/11/2021   Low, to start oral replacement   Encounter for well adult exam with abnormal findings Age and sex appropriate education and counseling  updated with regular exercise and diet Referrals for preventative services - none needed Immunizations addressed - declines covid booster, flu shot, shingrix, tdap Smoking counseling  - none needed Evidence for depression or other mood disorder - none significant Most recent labs reviewed. I have personally reviewed and have noted: 1) the patient's medical and social history 2) The patient's current medications and supplements 3) The patient's height, weight, and BMI have been recorded in the chart   Asthma Stable, cont inhaler prn  Essential hypertension BP Readings from Last 3 Encounters:  03/13/21 124/78  02/15/21 131/83  11/14/20 (!) 143/95   Stable, pt to continue medical treatment hyzaar, toprol   Hyperlipidemia Lab Results  Component Value Date   [place] 90 03/11/2021   Stable, pt to continue current statin lipitor   Impaired glucose tolerance Lab Results  Component Value Date   HGBA1C 6.2 03/11/2021   Stable, pt to continue current medical treatment  - diet   Microhematuria Chronic recurrent, declines urology f/u for now  Followup: Return in about 6 months (around 09/10/2021).  Cathlean Cower, MD 03/17/2021 5:50 PM [facility]

## 2021-03-13 NOTE — Assessment & Plan Note (Signed)
Last vitamin D Lab Results  Component Value Date   VD25OH 9.99 (L) 03/11/2021   Low, to start oral replacement

## 2021-03-13 NOTE — Progress Notes (Signed)
Subjective:   Patient ID: [name], female   DOB: 61 y.o.   MRN: [id number]   HPI Patient presents with thick dystrophic nailbeds 1-5 both feet that are painful when pressed and she cannot take care of herself with obesity is complicating factor   ROS      Objective:  Physical Exam  Neurovascular status unchanged thick yellow brittle nailbeds 1-5 both feet incurvated and painful that she cannot take care of herself     Assessment:  Chronic mycotic nail infection 1-5 both feet     Plan:  Debridement painful nailbeds 1-5 both feet no iatrogenic bleeding reappoint routine care

## 2021-03-17 ENCOUNTER — Encounter: Payer: Self-pay | Admitting: Internal Medicine

## 2021-03-17 NOTE — Assessment & Plan Note (Signed)
Lab Results  Component Value Date   LDLCALC 90 03/11/2021   Stable, pt to continue current statin lipitor

## 2021-03-17 NOTE — Assessment & Plan Note (Signed)
BP Readings from Last 3 Encounters:  03/13/21 124/78  02/15/21 131/83  11/14/20 (!) 143/95   Stable, pt to continue medical treatment hyzaar, toprol

## 2021-03-17 NOTE — Assessment & Plan Note (Signed)
Lab Results  Component Value Date   HGBA1C 6.2 03/11/2021   Stable, pt to continue current medical treatment  - diet

## 2021-03-17 NOTE — Assessment & Plan Note (Signed)
Stable, cont inhaler prn 

## 2021-03-17 NOTE — Assessment & Plan Note (Signed)
Age and sex appropriate education and counseling updated with regular exercise and diet Referrals for preventative services - none needed Immunizations addressed - declines covid booster, flu shot, shingrix, tdap Smoking counseling  - none needed Evidence for depression or other mood disorder - none significant Most recent labs reviewed. I have personally reviewed and have noted: 1) the patient's medical and social history 2) The patient's current medications and supplements 3) The patient's height, weight, and BMI have been recorded in the chart

## 2021-03-17 NOTE — Assessment & Plan Note (Signed)
Chronic recurrent, declines urology f/u for now

## 2021-03-20 ENCOUNTER — Ambulatory Visit: Payer: Self-pay | Admitting: Podiatry

## 2021-03-20 ENCOUNTER — Encounter: Payer: 59 | Admitting: Internal Medicine

## 2021-03-31 ENCOUNTER — Other Ambulatory Visit: Payer: Self-pay

## 2021-03-31 ENCOUNTER — Emergency Department (HOSPITAL_COMMUNITY)
Admission: EM | Admit: 2021-03-31 | Discharge: 2021-04-01 | Disposition: A | Discharge status: Home / Self Care | Payer: 59 | Attending: Emergency Medicine | Admitting: Emergency Medicine

## 2021-03-31 ENCOUNTER — Emergency Department (HOSPITAL_COMMUNITY): Payer: 59

## 2021-03-31 DIAGNOSIS — J45909 Unspecified asthma, uncomplicated: Secondary | ICD-10-CM | POA: Diagnosis not present

## 2021-03-31 DIAGNOSIS — R0789 Other chest pain: Secondary | ICD-10-CM | POA: Diagnosis not present

## 2021-03-31 DIAGNOSIS — F1020 Alcohol dependence, uncomplicated: Secondary | ICD-10-CM

## 2021-03-31 DIAGNOSIS — R748 Abnormal levels of other serum enzymes: Secondary | ICD-10-CM | POA: Insufficient documentation

## 2021-03-31 DIAGNOSIS — Z79899 Other long term (current) drug therapy: Secondary | ICD-10-CM | POA: Insufficient documentation

## 2021-03-31 DIAGNOSIS — R109 Unspecified abdominal pain: Secondary | ICD-10-CM | POA: Diagnosis not present

## 2021-03-31 DIAGNOSIS — Z7901 Long term (current) use of anticoagulants: Secondary | ICD-10-CM | POA: Diagnosis not present

## 2021-03-31 DIAGNOSIS — Y904 Blood alcohol level of 80-99 mg/100 ml: Secondary | ICD-10-CM | POA: Insufficient documentation

## 2021-03-31 DIAGNOSIS — I10 Essential (primary) hypertension: Secondary | ICD-10-CM | POA: Insufficient documentation

## 2021-03-31 DIAGNOSIS — Z91040 Latex allergy status: Secondary | ICD-10-CM | POA: Insufficient documentation

## 2021-03-31 DIAGNOSIS — R69 Illness, unspecified: Secondary | ICD-10-CM | POA: Diagnosis not present

## 2021-03-31 DIAGNOSIS — R079 Chest pain, unspecified: Secondary | ICD-10-CM | POA: Diagnosis not present

## 2021-03-31 DIAGNOSIS — R0602 Shortness of breath: Secondary | ICD-10-CM | POA: Diagnosis not present

## 2021-03-31 LAB — BASIC METABOLIC PANEL
Anion gap: 15 (ref 5–15)
BUN: 16 mg/dL (ref 6–20)
CO2: 25 mmol/L (ref 22–32)
Calcium: 9 mg/dL (ref 8.9–10.3)
Chloride: 99 mmol/L (ref 98–111)
Creatinine, Ser: 0.95 mg/dL (ref 0.44–1.00)
GFR, Estimated: >60 mL/min (ref >60)
Glucose, Bld: 94 mg/dL (ref 70–99)
Potassium: 3.8 mmol/L (ref 3.5–5.1)
Sodium: 139 mmol/L (ref 135–145)

## 2021-03-31 LAB — I-STAT BETA HCG BLOOD, ED (MC, WL, AP ONLY): I-stat hCG, quantitative: <5 m[IU]/mL (ref <5)

## 2021-03-31 LAB — CBC
HCT: 44.5 % (ref 36.0–46.0)
Hemoglobin: 14.2 g/dL (ref 12.0–15.0)
MCH: 30.3 pg (ref 26.0–34.0)
MCHC: 31.9 g/dL (ref 30.0–36.0)
MCV: 94.9 fL (ref 80.0–100.0)
Platelets: 248 10*3/uL (ref 150–400)
RBC: 4.69 MIL/uL (ref 3.87–5.11)
RDW: 13.9 % (ref 11.5–15.5)
WBC: 8.3 10*3/uL (ref 4.0–10.5)
nRBC: 0.2 % (ref 0.0–0.2)

## 2021-03-31 LAB — TROPONIN I (HIGH SENSITIVITY): Troponin I (High Sensitivity): 29 ng/L — ABNORMAL HIGH (ref <18)

## 2021-03-31 MED ORDER — HYOSCYAMINE SULFATE 0.125 MG SL SUBL
0.2500 mg | SUBLINGUAL_TABLET | Freq: Once | SUBLINGUAL | Status: AC
  Administered 2021-03-31: 0.25 mg via SUBLINGUAL
  Filled 2021-03-31: qty 2

## 2021-03-31 MED ORDER — LIDOCAINE VISCOUS HCL 2 % MT SOLN
15.0000 mL | Freq: Once | OROMUCOSAL | Status: AC
  Administered 2021-03-31: 15 mL via ORAL
  Filled 2021-03-31: qty 15

## 2021-03-31 MED ORDER — ALUM & MAG HYDROXIDE-SIMETH 200-200-20 MG/5ML PO SUSP
30.0000 mL | Freq: Once | ORAL | Status: AC
  Administered 2021-03-31: 30 mL via ORAL
  Filled 2021-03-31: qty 30

## 2021-03-31 MED ORDER — ONDANSETRON 4 MG PO TBDP
4.0000 mg | ORAL_TABLET | Freq: Once | ORAL | Status: AC
  Administered 2021-03-31: 4 mg via ORAL
  Filled 2021-03-31: qty 1

## 2021-03-31 NOTE — ED Provider Notes (Signed)
[place] EMERGENCY DEPARTMENT Provider Note  CSN: [id number] Arrival date & time: 03/31/21 2227  Chief Complaint(s) Chest Pain  HPI [name] is a 61 y.o. female with a past medical history listed below including alcoholic binge disorder, hypertension, hyperlipidemia who presents to the emergency department with an episode of right-sided chest pain that was brief lasting only seconds.  Onset was about 2 hours ago.  No repeated chest pain since.  Because of the chest pain patient reports feeling anxious and short of breath which is also resolved.  She reports that she is mostly here for help with alcoholic withdrawal.  States that she had an alcoholic binge over the weekend for her birthday.  Last drank this morning.  Reports feeling extremely anxious.   Denies any recent fevers or infections. Endorsing nausea without emesis.  Mild abdominal discomfort.  No other physical complaints   Chest Pain  Past Medical History Past Medical History:  Diagnosis Date   Abscess of bladder 07/28/2016   Alcohol abuse 07/28/2016   abuse- moderate years ago - states only drinks now on weekends -2 to 8 driinks    Alcohol dependence ([place])    Allergic rhinitis 12/06/2013   Asthma    Asthma 03/16/2015   Atrial fibrillation with RVR ([place]) 04/01/2020   Atrial flutter with rapid ventricular response ([place]) 04/01/2020   COLONIC POLYPS, HX OF 04/05/2010   DIVERTICULITIS, HX OF 04/05/2010   DJD (degenerative joint disease)    right knee, mot to severe   GERD (gastroesophageal reflux disease)    no meds   Heart murmur    hx of    Hyperlipidemia    Hypertension    Impaired glucose tolerance 12/06/2013   Morbid obesity with BMI of 45.0-49.9, adult ([place])    Obesity, Class III, BMI 40-49.9 (morbid obesity) ([place]) 10/03/2014   PALPITATIONS, HX OF 09/14/2007   Patient Active Problem List   Diagnosis Date Noted   Vitamin D deficiency 03/13/2021   Microhematuria 03/13/2021   Positive blood  cultures    Asthma exacerbation 07/22/2020   Hyperkalemia 07/22/2020   Atrial fibrillation with RVR ([place]) 04/01/2020   Atrial flutter with rapid ventricular response ([place]) 04/01/2020   Alcohol dependence with withdrawal ([place]) 12/19/2019   History of pulmonary embolism 12/19/2019   Pneumonia due to COVID-19 virus 02/10/2019   Pulmonary embolism ([place]) 07/06/2018   Leukocytosis 09/22/2017   Rash 09/22/2017   Sepsis ([place]) 09/12/2016   Colovesical fistula 09/12/2016   Alcohol abuse 07/28/2016   Colonic diverticulum 07/28/2016   Hyperlipidemia 07/10/2016   Mass of left side of neck 07/10/2016   Head and neck lymphadenopathy 07/10/2016   Eustachian tube disorder 01/25/2016   Peripheral edema 03/16/2015   Asthma 03/16/2015   Right shoulder pain 03/16/2015   Hypokalemia 10/04/2014   Upper airway cough syndrome 10/03/2014   Obesity, Class III, BMI 40-49.9 (morbid obesity) ([place]) 10/03/2014   Lower back pain 05/25/2014   Bilateral shoulder pain 05/25/2014   Allergic rhinitis 12/06/2013   Impaired glucose tolerance 12/06/2013   Expected blood loss anemia 12/16/2012   S/P left TKA 12/14/2012   Goiter 11/26/2012   Preop exam for internal medicine 11/26/2012   Vaginal bleeding 04/30/2011   Colon polyps 02/10/2011   Eczema 02/10/2011   Depression 02/10/2011   Encounter for well adult exam with abnormal findings 09/27/2010   Anxiety state 04/05/2010   DIVERTICULITIS, HX OF 04/05/2010   PALPITATIONS, HX OF 09/14/2007   Essential hypertension 04/24/2007   VOCAL  CORD DISORDER 04/24/2007   Extrinsic asthma 04/24/2007   GERD 04/24/2007   Home Medication(s) Prior to Admission medications   Medication Sig Start Date End Date Taking? Authorizing Provider  albuterol (VENTOLIN HFA) 108 (90 Base) MCG/ACT inhaler 2 puffs every 6 hrs as needed Patient taking differently: Inhale 2 puffs into the lungs every 6 (six) hours as needed for wheezing or shortness of breath. 03/13/21  Yes Biagio Borg, MD   alum & mag hydroxide-simeth (MAALOX/MYLANTA) 200-200-20 MG/5ML suspension Take 30 mLs by mouth every 6 (six) hours as needed for indigestion or heartburn.   Yes [provider]  atorvastatin (LIPITOR) 20 MG tablet Take 1 tablet (20 mg total) by mouth daily. 07/20/20 07/20/21 Yes Biagio Borg, MD  chlordiazePOXIDE (LIBRIUM) 25 MG capsule 50mg  PO TID x 1D, then 25-50mg  PO BID X 1D, then 25-50mg  PO QD X 1D 04/01/21  Yes Danira Nylander, Grayce Sessions, MD  guaiFENesin (MUCINEX) 600 MG 12 hr tablet Take 2 tablets (1,200 mg total) by mouth 2 (two) times daily as needed. Patient taking differently: Take 1,200 mg by mouth 2 (two) times daily as needed for cough. 12/28/20  Yes Biagio Borg, MD  losartan-hydrochlorothiazide (HYZAAR) 100-25 MG tablet Take 1 tablet by mouth daily. 07/20/20  Yes Biagio Borg, MD  metoprolol succinate (TOPROL-XL) 25 MG 24 hr tablet TAKE 1 TABLET BY MOUTH EVERYDAY AT BEDTIME Patient taking differently: Take 25 mg by mouth daily. 08/09/20  Yes Sherran Needs, NP  ondansetron (ZOFRAN-ODT) 4 MG disintegrating tablet Take 1 tablet (4 mg total) by mouth every 8 (eight) hours as needed for up to 3 days for nausea or vomiting. 04/01/21 04/04/21 Yes Josslin Sanjuan, Grayce Sessions, MD  Polyethyl Glycol-Propyl Glycol (SYSTANE OP) Place 1 drop into both eyes 2 (two) times daily as needed (dry eyes).   Yes [provider]  potassium chloride (KLOR-CON) 10 MEQ tablet Take 1 tablet (10 mEq total) by mouth 2 (two) times daily. Patient taking differently: Take 10 mEq by mouth 2 (two) times daily as needed (low potassium). 07/30/20  Yes Biagio Borg, MD  triamcinolone cream (KENALOG) 0.1 % APPLY TO AFFECTED AREA TWICE A DAY Patient taking differently: Apply 1 application topically 2 (two) times daily as needed (rash). 11/09/20  Yes Biagio Borg, MD  apixaban (ELIQUIS) 5 MG TABS tablet Take 1 tablet (5 mg total) by mouth 2 (two) times daily. 10/17/20   Biagio Borg, MD  omeprazole (PRILOSEC) 20 MG  capsule Take 1 capsule (20 mg total) by mouth daily. 04/01/21   Fatima Blank, MD                                                                                                                                    Allergies Morphine, Shrimp [shellfish allergy], Tramadol, Covid-19 (mrna) vaccine, Diltiazem hcl, Other, and Latex  Review of Systems Review of Systems  Cardiovascular:  Positive for  chest pain.  As noted in HPI  Physical Exam Vital Signs  I have reviewed the triage vital signs BP (!) 157/86    Pulse 91    Temp 98.7 F (37.1 C) (Oral)    Resp 17    Ht 5\' 1"  (1.549 m)    Wt 129.3 kg    SpO2 97%    BMI 53.85 kg/m   Physical Exam Vitals reviewed.  Constitutional:      General: She is not in acute distress.    Appearance: She is well-developed. She is obese. She is not diaphoretic.  HENT:     Head: Normocephalic and atraumatic.     Nose: Nose normal.  Eyes:     General: No scleral icterus.       Right eye: No discharge.        Left eye: No discharge.     Conjunctiva/sclera: Conjunctivae normal.     Pupils: Pupils are equal, round, and reactive to light.  Cardiovascular:     Rate and Rhythm: Normal rate and regular rhythm.     Heart sounds: No murmur heard.   No friction rub. No gallop.  Pulmonary:     Effort: Pulmonary effort is normal. No respiratory distress.     Breath sounds: Normal breath sounds. No stridor. No rales.  Abdominal:     General: There is no distension.     Palpations: Abdomen is soft.     Tenderness: There is no abdominal tenderness.  Musculoskeletal:        General: No tenderness.     Cervical back: Normal range of motion and neck supple.  Skin:    General: Skin is warm and dry.     Findings: No erythema or rash.  Neurological:     Mental Status: She is alert and oriented to person, place, and time.     Comments: Intermittently tremulous. No obvious tremor when distracted during assessment    ED Results and Treatments Labs (all  labs ordered are listed, but only abnormal results are displayed) Labs Reviewed  ETHANOL - Abnormal; Notable for the following components:      Result Value   Alcohol, Ethyl (B) 87 (*)    All other components within normal limits  TROPONIN I (HIGH SENSITIVITY) - Abnormal; Notable for the following components:   Troponin I (High Sensitivity) 29 (*)    All other components within normal limits  TROPONIN I (HIGH SENSITIVITY) - Abnormal; Notable for the following components:   Troponin I (High Sensitivity) 24 (*)    All other components within normal limits  BASIC METABOLIC PANEL  CBC  I-STAT BETA HCG BLOOD, ED ([facility], [facility], [facility] ONLY)                                                                                                                         EKG  EKG Interpretation  Date/Time:  Sunday March 31 2021 22:59:13 EST Ventricular Rate:  78 PR Interval:  152 QRS Duration: 100 QT Interval:  398 QTC Calculation: 453 R Axis:   -27 Text Interpretation: Sinus rhythm with occasional Premature ventricular complexes Cannot rule out Anterior infarct , age undetermined Abnormal ECG No acute changes When compared with ECG of 02-Nov-2020 19:12, PREVIOUS ECG IS PRESENT Confirmed by Addison Lank [phone]) on 03/31/2021 11:07:43 PM       Radiology DG Chest 2 View  Result Date: 03/31/2021 CLINICAL DATA:  Chest pain and shortness of breath EXAM: CHEST - 2 VIEW COMPARISON:  11/03/2020 FINDINGS: Cardiac shadow is mildly prominent but stable. Aortic calcifications are seen. Lungs are well aerated bilaterally. No focal infiltrate or sizable effusion is noted. Degenerative changes of the thoracic spine are seen. IMPRESSION: No active cardiopulmonary disease. Electronically Signed   By: Inez Catalina M.D.   On: 03/31/2021 23:23    Pertinent labs & imaging results that were available during my care of the patient were reviewed by me and considered in my medical decision making (see MDM for  details).  Medications Ordered in ED Medications  hydrochlorothiazide (HYDRODIURIL) tablet 25 mg (has no administration in time range)  alum & mag hydroxide-simeth (MAALOX/MYLANTA) 200-200-20 MG/5ML suspension 30 mL (30 mLs Oral Given 03/31/21 2358)    And  lidocaine (XYLOCAINE) 2 % viscous mouth solution 15 mL (15 mLs Oral Given 03/31/21 2358)  hyoscyamine (LEVSIN SL) SL tablet 0.25 mg (0.25 mg Sublingual Given 03/31/21 2357)  ondansetron (ZOFRAN-ODT) disintegrating tablet 4 mg (4 mg Oral Given 03/31/21 2357)  chlordiazePOXIDE (LIBRIUM) capsule 25 mg (25 mg Oral Given 04/01/21 0137)  losartan (COZAAR) tablet 100 mg (100 mg Oral Given 04/01/21 0233)  LORazepam (ATIVAN) injection 1 mg (1 mg Intravenous Given 04/01/21 0233)  ondansetron (ZOFRAN) injection 4 mg (4 mg Intravenous Given 04/01/21 0234)  sodium chloride 0.9 % bolus 1,000 mL (0 mLs Intravenous Stopped 04/01/21 0531)  albuterol (VENTOLIN HFA) 108 (90 Base) MCG/ACT inhaler 2 puff (2 puffs Inhalation Given 04/01/21 0233)                                                                                                                                     Procedures Procedures  (including critical care time)  Medical Decision Making / ED Course        Chest pain Right-sided and brief. Atypical for ACS. But will get EKG and trops given her comorbidities. Not consistent with aortic dissection or esophageal perforation. Low suspicion for pulmonary embolism.        Alcoholic dependence  Patient reports feeling anxious and going through withdrawals. Intermittently tremulous though not when distracted She is not tachycardic.  Does have mild hypertension.  Work-up ordered to assess concerns above.  Labs and imaging independently interpreted by me and noted below: EKG without acute ischemic changes or evidence of pericarditis CBC without leukocytosis or anemia No significant electrolyte derangement or renal sufficiency Trop slightly elevated  at 29 though lower than  her baseline Trope level. We will trend. Trop down to 24. On my chest x-ray, there was no evidence suggestive of pneumonia, pneumothorax, pneumomediastinum, pulmonary edema concerning for new or exacerbation of heart failure, abnormal contour of the mediastinum to suggest dissection, and no evidence of acute injuries.   Management: Will provide with GI cocktail Zofran IV ativan librium  Reassessment: HDS stable. Requested albuterol for asthma. Given.  Became tachycardic afterward. Felt to be related to albuterol and not withdraw. Hospitalization considered, but patient is stable for outpatient librium taper.  Final Clinical Impression(s) / ED Diagnoses Final diagnoses:  Alcohol use disorder, moderate, dependence ([place])  Intermittent right-sided chest pain   The patient appears reasonably screened and/or stabilized for discharge and I doubt any other medical condition or other [facility] requiring further screening, evaluation, or treatment in the ED at this time prior to discharge. Safe for discharge with strict return precautions.  Disposition: Discharge  Condition: Good  I have discussed the results, Dx and Tx plan with the patient/family who expressed understanding and agree(s) with the plan. Discharge instructions discussed at length. The patient/family was given strict return precautions who verbalized understanding of the instructions. No further questions at time of discharge.    ED Discharge Orders          Ordered    omeprazole (PRILOSEC) 20 MG capsule  Daily        04/01/21 0421    ondansetron (ZOFRAN-ODT) 4 MG disintegrating tablet  Every 8 hours PRN        04/01/21 0423    chlordiazePOXIDE (LIBRIUM) 25 MG capsule        04/01/21 0525            [facility] narcotic database reviewed and no active prescriptions noted.   Follow Up: Biagio Borg, [address] [address] [place] [address] [phone]  Call  to schedule an  appointment for close follow up           This chart was dictated using voice recognition software.  Despite best efforts to proofread,  errors can occur which can change the documentation meaning.    Fatima Blank, MD 04/01/21 [phone]

## 2021-03-31 NOTE — ED Triage Notes (Signed)
Pt c/o chest pain and shortness of breath and chest pain. Pt states she may be having an anxiety attack or she may be going through detox from alcohol. Pt reports her last drink was this morning.

## 2021-04-01 ENCOUNTER — Other Ambulatory Visit (HOSPITAL_COMMUNITY): Payer: Self-pay | Admitting: Nurse Practitioner

## 2021-04-01 LAB — ETHANOL: Alcohol, Ethyl (B): 87 mg/dL — ABNORMAL HIGH (ref <10)

## 2021-04-01 LAB — HEPATIC FUNCTION PANEL
ALT: 110 U/L — ABNORMAL HIGH (ref 0–44)
AST: 162 U/L — ABNORMAL HIGH (ref 15–41)
Albumin: 3.5 g/dL (ref 3.5–5.0)
Alkaline Phosphatase: 116 U/L (ref 38–126)
Bilirubin, Direct: 0.2 mg/dL (ref 0.0–0.2)
Indirect Bilirubin: 1 mg/dL — ABNORMAL HIGH (ref 0.3–0.9)
Total Bilirubin: 1.2 mg/dL (ref 0.3–1.2)
Total Protein: 7.7 g/dL (ref 6.5–8.1)

## 2021-04-01 LAB — TROPONIN I (HIGH SENSITIVITY): Troponin I (High Sensitivity): 24 ng/L — ABNORMAL HIGH (ref <18)

## 2021-04-01 MED ORDER — ALBUTEROL SULFATE HFA 108 (90 BASE) MCG/ACT IN AERS
2.0000 | INHALATION_SPRAY | Freq: Once | RESPIRATORY_TRACT | Status: AC
  Administered 2021-04-01: 2 via RESPIRATORY_TRACT
  Filled 2021-04-01: qty 6.7

## 2021-04-01 MED ORDER — LORAZEPAM 2 MG/ML IJ SOLN
1.0000 mg | Freq: Once | INTRAMUSCULAR | Status: AC
  Administered 2021-04-01: 1 mg via INTRAVENOUS
  Filled 2021-04-01: qty 1

## 2021-04-01 MED ORDER — OMEPRAZOLE 20 MG PO CPDR: 20.0000 mg | DELAYED_RELEASE_CAPSULE | Freq: Every day | ORAL | 0 refills | Status: DC

## 2021-04-01 MED ORDER — ONDANSETRON 4 MG PO TBDP: 4.0000 mg | ORAL_TABLET | Freq: Three times a day (TID) | ORAL | 0 refills | Status: AC | PRN

## 2021-04-01 MED ORDER — HYDROCHLOROTHIAZIDE 25 MG PO TABS: 25.0000 mg | ORAL_TABLET | Freq: Every day | ORAL | Status: DC

## 2021-04-01 MED ORDER — CHLORDIAZEPOXIDE HCL 25 MG PO CAPS: ORAL_CAPSULE | ORAL | 0 refills | Status: DC

## 2021-04-01 MED ORDER — CHLORDIAZEPOXIDE HCL 25 MG PO CAPS
25.0000 mg | ORAL_CAPSULE | Freq: Once | ORAL | Status: AC
  Administered 2021-04-01: 25 mg via ORAL
  Filled 2021-04-01: qty 1

## 2021-04-01 MED ORDER — SODIUM CHLORIDE 0.9 % IV BOLUS
1000.0000 mL | Freq: Once | INTRAVENOUS | Status: AC
Start: 2021-04-01 — End: 2021-04-01
  Administered 2021-04-01: 1000 mL via INTRAVENOUS

## 2021-04-01 MED ORDER — ONDANSETRON HCL 4 MG/2ML IJ SOLN
4.0000 mg | Freq: Once | INTRAMUSCULAR | Status: AC
  Administered 2021-04-01: 4 mg via INTRAVENOUS
  Filled 2021-04-01: qty 2

## 2021-04-01 MED ORDER — LOSARTAN POTASSIUM 50 MG PO TABS
100.0000 mg | ORAL_TABLET | Freq: Once | ORAL | Status: AC
  Administered 2021-04-01: 100 mg via ORAL
  Filled 2021-04-01: qty 2

## 2021-04-01 NOTE — ED Notes (Signed)
Leonette Monarch, MD made aware of CIWA

## 2021-04-02 NOTE — Telephone Encounter (Signed)
Pt was seen Mar 13 2021  Please refill as per office routine med refill policy (all routine meds to be refilled for 3 mo or monthly (per pt preference) up to one year from last visit, then month to month grace period for 3 mo, then further med refills will have to be denied)

## 2021-04-03 ENCOUNTER — Encounter: Payer: Self-pay | Admitting: Internal Medicine

## 2021-04-05 NOTE — Telephone Encounter (Signed)
Pt requesting a c/b regarding mychart message  Phone [phone]

## 2021-04-08 ENCOUNTER — Telehealth: Payer: Self-pay

## 2021-04-08 NOTE — Telephone Encounter (Signed)
Pt is requesting a a refill on: omeprazole (PRILOSEC) 20 MG capsule chlordiazePOXIDE (LIBRIUM) 25 MG capsule These meds were prescribed from the [facility]  Pharmacy: [id number] - [place], [place] [birth year]  Pt CB [phone]

## 2021-04-09 MED ORDER — CHLORDIAZEPOXIDE HCL 25 MG PO CAPS: ORAL_CAPSULE | ORAL | 0 refills | Status: DC

## 2021-04-09 NOTE — Telephone Encounter (Signed)
Ok done erx 

## 2021-04-10 NOTE — Telephone Encounter (Signed)
Patient notified that medication was sent to pharmacy.

## 2021-04-17 ENCOUNTER — Encounter (HOSPITAL_COMMUNITY): Payer: Self-pay | Admitting: Nurse Practitioner

## 2021-04-17 ENCOUNTER — Other Ambulatory Visit: Payer: Self-pay

## 2021-04-17 ENCOUNTER — Ambulatory Visit (HOSPITAL_COMMUNITY)
Admission: RE | Admit: 2021-04-17 | Discharge: 2021-04-17 | Disposition: A | Discharge status: Home / Self Care | Payer: 59 | Source: Ambulatory Visit | Attending: Nurse Practitioner | Admitting: Nurse Practitioner

## 2021-04-17 VITALS — BP 146/90 | HR 80 | Ht 61.0 in | Wt 298.6 lb

## 2021-04-17 DIAGNOSIS — Z6841 Body Mass Index (BMI) 40.0 and over, adult: Secondary | ICD-10-CM | POA: Insufficient documentation

## 2021-04-17 DIAGNOSIS — R69 Illness, unspecified: Secondary | ICD-10-CM | POA: Diagnosis not present

## 2021-04-17 DIAGNOSIS — E785 Hyperlipidemia, unspecified: Secondary | ICD-10-CM | POA: Diagnosis not present

## 2021-04-17 DIAGNOSIS — R9431 Abnormal electrocardiogram [ECG] [EKG]: Secondary | ICD-10-CM | POA: Insufficient documentation

## 2021-04-17 DIAGNOSIS — Z79899 Other long term (current) drug therapy: Secondary | ICD-10-CM | POA: Diagnosis not present

## 2021-04-17 DIAGNOSIS — I480 Paroxysmal atrial fibrillation: Secondary | ICD-10-CM

## 2021-04-17 DIAGNOSIS — F1010 Alcohol abuse, uncomplicated: Secondary | ICD-10-CM | POA: Diagnosis not present

## 2021-04-17 DIAGNOSIS — I482 Chronic atrial fibrillation, unspecified: Secondary | ICD-10-CM | POA: Diagnosis not present

## 2021-04-17 DIAGNOSIS — J45909 Unspecified asthma, uncomplicated: Secondary | ICD-10-CM | POA: Diagnosis not present

## 2021-04-17 DIAGNOSIS — I10 Essential (primary) hypertension: Secondary | ICD-10-CM | POA: Insufficient documentation

## 2021-04-17 DIAGNOSIS — Z7901 Long term (current) use of anticoagulants: Secondary | ICD-10-CM | POA: Diagnosis not present

## 2021-04-17 DIAGNOSIS — D6869 Other thrombophilia: Secondary | ICD-10-CM | POA: Diagnosis not present

## 2021-04-17 DIAGNOSIS — I493 Ventricular premature depolarization: Secondary | ICD-10-CM | POA: Insufficient documentation

## 2021-04-17 MED ORDER — METOPROLOL SUCCINATE ER 25 MG PO TB24: 25.0000 mg | ORAL_TABLET | Freq: Every day | ORAL | 11 refills | Status: DC

## 2021-04-17 MED ORDER — POTASSIUM CHLORIDE CRYS ER 10 MEQ PO TBCR: 10.0000 meq | EXTENDED_RELEASE_TABLET | ORAL | Status: DC | PRN

## 2021-04-17 MED ORDER — APIXABAN 5 MG PO TABS: 5.0000 mg | ORAL_TABLET | Freq: Two times a day (BID) | ORAL | 11 refills | Status: DC

## 2021-04-17 NOTE — Progress Notes (Signed)
Primary Care Physician: Biagio Borg, MD Referring Physician:[facility] f/u   [name] is a 61 y.o. female with a h/o PMH of alcohol abuse, asthma, obesity, HTN, HLD, h/o palpitations who presented to the ED with a chief complaint of overall weakness, tremors, intermittent headaches, and visual hallucinations. She reported that she has been drinking a pint to 1/5 of liquor daily for the past 2 weeks.  In the ED, noted to be in new onset  afib with RVR.  CIWA initiated.   She is was d/c on metoprolol 75 mg bid and eliquis 5 mg bid. She did not tolerate Cardizem in the hospital. She states that she feels strange on the metoprolol so she has cut back to 50 mg daily. She is being compliant with eliquis. No bleeding issues.has reminded in SR. No further alcohol use. No caffeine. No street drugs.  States no significant snoring or apnea. Liver enzymes were elevated in the hospital and cholesterol meds stopped. Hepatitis panel negative.   F/u in afib clinic, 04/17/21,  as pt needed med refills and has not been back to the office since 06/2020.  She had recently been back in the ER for alcohol abuse mid February. EKG shows SR. She was placed on Librium in the ER which she states has really helped with her craving alcohol but needs a refill. She has only had 2 shots since ER visit. She was off eliquis for a while due to cost but now has insurance and should be able to use the 10$ co pay card.   Today, she denies symptoms of palpitations, chest pain, shortness of breath, orthopnea, PND, lower extremity edema, dizziness, presyncope, syncope, or neurologic sequela. The patient is tolerating medications without difficulties and is otherwise without complaint today.   Past Medical History:  Diagnosis Date   Abscess of bladder 07/28/2016   Alcohol abuse 07/28/2016   abuse- moderate years ago - states only drinks now on weekends -2 to 8 driinks    Alcohol dependence ([place])    Allergic rhinitis 12/06/2013   Asthma     Asthma 03/16/2015   Atrial fibrillation with RVR ([place]) 04/01/2020   Atrial flutter with rapid ventricular response ([place]) 04/01/2020   COLONIC POLYPS, HX OF 04/05/2010   DIVERTICULITIS, HX OF 04/05/2010   DJD (degenerative joint disease)    right knee, mot to severe   GERD (gastroesophageal reflux disease)    no meds   Heart murmur    hx of    Hyperlipidemia    Hypertension    Impaired glucose tolerance 12/06/2013   Morbid obesity with BMI of 45.0-49.9, adult ([place])    Obesity, Class III, BMI 40-49.9 (morbid obesity) ([place]) 10/03/2014   PALPITATIONS, HX OF 09/14/2007   Past Surgical History:  Procedure Laterality Date   ABDOMINAL HYSTERECTOMY  age 37   fibroids   COLONOSCOPY WITH PROPOFOL N/A 07/25/2016   Procedure: COLONOSCOPY WITH PROPOFOL;  Surgeon: Carol Ada, MD;  Location: [facility];  Service: Endoscopy;  Laterality: N/A;   colonscopy     x 2   IR RADIOLOGIST EVAL & MGMT  08/12/2016   IR RADIOLOGIST EVAL & MGMT  08/21/2016   KNEE ARTHROSCOPY     left    TOTAL KNEE ARTHROPLASTY  07/29/2011   Procedure: TOTAL KNEE ARTHROPLASTY;  Surgeon: Mauri Pole, MD;  Location: [facility];  Service: Orthopedics;  Laterality: Right;   TOTAL KNEE ARTHROPLASTY Left 12/14/2012   Procedure: LEFT TOTAL KNEE ARTHROPLASTY;  Surgeon: Rodman Key  Marian Sorrow, MD;  Location: [facility];  Service: Orthopedics;  Laterality: Left;    Current Outpatient Medications  Medication Sig Dispense Refill   albuterol (VENTOLIN HFA) 108 (90 Base) MCG/ACT inhaler 2 puffs every 6 hrs as needed (Patient taking differently: Inhale 2 puffs into the lungs every 6 (six) hours as needed for wheezing or shortness of breath.) 8.5 each 11   alum & mag hydroxide-simeth (MAALOX/MYLANTA) 200-200-20 MG/5ML suspension Take 30 mLs by mouth every 6 (six) hours as needed for indigestion or heartburn.     apixaban (ELIQUIS) 5 MG TABS tablet Take 1 tablet (5 mg total) by mouth 2 (two) times daily. 180 tablet 3   atorvastatin (LIPITOR) 20 MG tablet Take  1 tablet (20 mg total) by mouth daily. 90 tablet 3   chlordiazePOXIDE (LIBRIUM) 25 MG capsule '50mg'$  PO TID x 1D, then 25-'50mg'$  PO BID X 1D, then 25-'50mg'$  PO QD X 1D 10 capsule 0   guaiFENesin (MUCINEX) 600 MG 12 hr tablet Take 2 tablets (1,200 mg total) by mouth 2 (two) times daily as needed. (Patient taking differently: Take 1,200 mg by mouth 2 (two) times daily as needed for cough.) 60 tablet 1   losartan-hydrochlorothiazide (HYZAAR) 100-25 MG tablet Take 1 tablet by mouth daily. 90 tablet 3   metoprolol succinate (TOPROL-XL) 25 MG 24 hr tablet Take 1 tablet (25 mg total) by mouth daily. Appointment Required For Further Refills [phone] 30 tablet 0   omeprazole (PRILOSEC) 20 MG capsule Take 1 capsule (20 mg total) by mouth daily. 30 capsule 0   Polyethyl Glycol-Propyl Glycol (SYSTANE OP) Place 1 drop into both eyes 2 (two) times daily as needed (dry eyes).     potassium chloride (KLOR-CON) 10 MEQ tablet Take 1 tablet (10 mEq total) by mouth 2 (two) times daily. (Patient taking differently: Take 10 mEq by mouth 2 (two) times daily as needed (low potassium).) 90 tablet 3   triamcinolone cream (KENALOG) 0.1 % APPLY TO AFFECTED AREA TWICE A DAY (Patient taking differently: Apply 1 application topically 2 (two) times daily as needed (rash).) 30 g 1   No current facility-administered medications for this encounter.    Allergies  Allergen Reactions   Morphine Itching   Shrimp [Shellfish Allergy] Itching and Other (See Comments)    Tongue burns also   Tramadol Other (See Comments)    Caused confusion   Covid-19 (Mrna) Vaccine Hives   Diltiazem Hcl Itching    Pt with itching of the feet when bolus given   Other Other (See Comments)   Latex Rash    Social History   Socioeconomic History   Marital status: Married    Spouse name: Not on file   Number of children: 1   Years of education: Not on file   Highest education level: Not on file  Occupational History   Occupation: book  binder/preserver    Employer: [employer]  Tobacco Use   Smoking status: Former    Packs/day: 0.50    Years: 7.00    Pack years: 3.50    Types: Cigarettes    Quit date: 02/11/1988    Years since quitting: 33.2   Smokeless tobacco: Never  Vaping Use   Vaping Use: Never used  Substance and Sexual Activity   Alcohol use: Not Currently    Comment: dependence(5weeks without)   Drug use: Not Currently    Types: Marijuana    Comment: smoked marijuana x 10 years.  Quit in 1985.   Sexual activity: Yes  Birth control/protection: Surgical    Comment: 1st intercourse 61 yo-Fewer than 5 partners  Other Topics Concern   Not on file  Social History Narrative   Environmental consultant on file.  [facility] April 05, 2010 12:04 PM   Social Determinants of Health   Financial Resource Strain: Not on file  Food Insecurity: Not on file  Transportation Needs: Not on file  Physical Activity: Not on file  Stress: Not on file  Social Connections: Not on file  Intimate Partner Violence: Not on file    Family History  Problem Relation Age of Onset   Stroke Mother    COPD Father    Lymphoma Sister     ROS- All systems are reviewed and negative except as per the HPI above  Physical Exam: There were no vitals filed for this visit.  Wt Readings from Last 3 Encounters:  03/31/21 129.3 kg  03/13/21 133.4 kg  11/14/20 127 kg    Labs: Lab Results  Component Value Date   NA 139 03/31/2021   K 3.8 03/31/2021   CL 99 03/31/2021   CO2 25 03/31/2021   GLUCOSE 94 03/31/2021   BUN 16 03/31/2021   CREATININE 0.95 03/31/2021   CALCIUM 9.0 03/31/2021   PHOS 4.3 12/22/2019   MG 1.9 04/03/2020   Lab Results  Component Value Date   INR 1.11 07/28/2016   Lab Results  Component Value Date   CHOL 168 03/11/2021   HDL 61.50 03/11/2021   Kelly 90 03/11/2021   TRIG 81.0 03/11/2021     GEN- The patient is well appearing, alert and oriented x 3 today.   Head- normocephalic,  atraumatic Eyes-  Sclera clear, conjunctiva pink Ears- hearing intact Oropharynx- clear Neck- supple, no JVP Lymph- no cervical lymphadenopathy Lungs- Clear to ausculation bilaterally, normal work of breathing Heart- Regular rate and rhythm, no murmurs, rubs or gallops, PMI not laterally displaced GI- soft, NT, ND, + BS Extremities- no clubbing, cyanosis, or edema MS- no significant deformity or atrophy Skin- no rash or lesion Psych- euthymic mood, full affect Neuro- strength and sensation are intact  EKG PR interval 162 ms QRS duration 94 ms QT/QTcB 414/477 ms P-R-T axes 26 14 42 Normal sinus rhythm Cannot rule out Anterior infarct , age undetermined Abnormal ECG   Echo- 1. Left ventricular ejection fraction, by estimation, is 50 to 55%. The  left ventricle has low normal function. The left ventricle has no regional  wall motion abnormalities. There is mild concentric left ventricular  hypertrophy. Left ventricular  diastolic parameters are indeterminate.   2. Right ventricular systolic function is normal. The right ventricular  size is normal. Tricuspid regurgitation signal is inadequate for assessing  PA pressure.   3. Left atrial size was mildly dilated.   4. Right atrial size was mildly dilated.   5. A small pericardial effusion is present. The pericardial effusion is  circumferential.   6. The mitral valve is normal in structure. No evidence of mitral valve  regurgitation. No evidence of mitral stenosis.   7. The aortic valve is grossly normal. Aortic valve regurgitation is not  visualized. No aortic stenosis is present.   8. The inferior vena cava is normal in size with <50% respiratory  variability, suggesting right atrial pressure of 8 mmHg.   Comparison(s): Changes from prior study are noted. EF now low normal.   Conclusion(s)/Recommendation(s): Otherwise normal echocardiogram, with  minor abnormalities described in the report.    Assessment and Plan: 1.  New onset afib One year ago In the setting of alcohol abuse Quiet so far   Another recent ER visit  for alcohol abuse 03/2021 but was not in afib  Continue 25 mg of metoprolol succinate qd   2. CHA2DS2VASc score of 4 Continue eliquis 5 mg bid  10$ co pay card given  Bleeding precautions reviewed  Labs reviewed form ER visit with normal CBC  3. BMI of 56.42 Regular exercsie and weight reduction encouraged  4, HTN Stable   5. Alcohol abuse Requesting refill on Librium given in the ER which she states has helped with alcohol cravings  Referred to PCP  F/u in afib clinic in one year  Butch Penny C. Tanylah Schnoebelen, [facility] [address] [place], [place] [address] [phone]

## 2021-04-29 ENCOUNTER — Emergency Department (HOSPITAL_COMMUNITY): Payer: 59

## 2021-04-29 ENCOUNTER — Encounter (HOSPITAL_COMMUNITY): Payer: Self-pay | Admitting: Emergency Medicine

## 2021-04-29 ENCOUNTER — Other Ambulatory Visit: Payer: Self-pay

## 2021-04-29 ENCOUNTER — Emergency Department (HOSPITAL_COMMUNITY)
Admission: EM | Admit: 2021-04-29 | Discharge: 2021-04-29 | Disposition: A | Discharge status: Home / Self Care | Payer: 59 | Attending: Emergency Medicine | Admitting: Emergency Medicine

## 2021-04-29 DIAGNOSIS — Z7901 Long term (current) use of anticoagulants: Secondary | ICD-10-CM | POA: Diagnosis not present

## 2021-04-29 DIAGNOSIS — R0602 Shortness of breath: Secondary | ICD-10-CM | POA: Diagnosis not present

## 2021-04-29 DIAGNOSIS — E876 Hypokalemia: Secondary | ICD-10-CM | POA: Diagnosis not present

## 2021-04-29 DIAGNOSIS — J45909 Unspecified asthma, uncomplicated: Secondary | ICD-10-CM | POA: Diagnosis not present

## 2021-04-29 DIAGNOSIS — Z20822 Contact with and (suspected) exposure to covid-19: Secondary | ICD-10-CM | POA: Diagnosis not present

## 2021-04-29 DIAGNOSIS — Z91040 Latex allergy status: Secondary | ICD-10-CM | POA: Insufficient documentation

## 2021-04-29 DIAGNOSIS — J4541 Moderate persistent asthma with (acute) exacerbation: Secondary | ICD-10-CM | POA: Diagnosis not present

## 2021-04-29 LAB — BASIC METABOLIC PANEL
Anion gap: 11 (ref 5–15)
BUN: 10 mg/dL (ref 6–20)
CO2: 26 mmol/L (ref 22–32)
Calcium: 8.9 mg/dL (ref 8.9–10.3)
Chloride: 103 mmol/L (ref 98–111)
Creatinine, Ser: 1.05 mg/dL — ABNORMAL HIGH (ref 0.44–1.00)
GFR, Estimated: >60 mL/min (ref >60)
Glucose, Bld: 123 mg/dL — ABNORMAL HIGH (ref 70–99)
Potassium: 3.4 mmol/L — ABNORMAL LOW (ref 3.5–5.1)
Sodium: 140 mmol/L (ref 135–145)

## 2021-04-29 LAB — CBC
HCT: 43.4 % (ref 36.0–46.0)
Hemoglobin: 13.6 g/dL (ref 12.0–15.0)
MCH: 29.5 pg (ref 26.0–34.0)
MCHC: 31.3 g/dL (ref 30.0–36.0)
MCV: 94.1 fL (ref 80.0–100.0)
Platelets: 268 10*3/uL (ref 150–400)
RBC: 4.61 MIL/uL (ref 3.87–5.11)
RDW: 13.5 % (ref 11.5–15.5)
WBC: 9.1 10*3/uL (ref 4.0–10.5)
nRBC: 0 % (ref 0.0–0.2)

## 2021-04-29 LAB — RESP PANEL BY RT-PCR (FLU A&B, COVID) ARPGX2
Influenza A by PCR: NEGATIVE
Influenza B by PCR: NEGATIVE
SARS Coronavirus 2 by RT PCR: NEGATIVE

## 2021-04-29 LAB — I-STAT BETA HCG BLOOD, ED (MC, WL, AP ONLY): I-stat hCG, quantitative: <5 m[IU]/mL (ref <5)

## 2021-04-29 MED ORDER — ALBUTEROL SULFATE HFA 108 (90 BASE) MCG/ACT IN AERS
2.0000 | INHALATION_SPRAY | RESPIRATORY_TRACT | Status: DC | PRN
  Administered 2021-04-29: 2 via RESPIRATORY_TRACT
  Filled 2021-04-29: qty 6.7

## 2021-04-29 MED ORDER — IPRATROPIUM-ALBUTEROL 0.5-2.5 (3) MG/3ML IN SOLN
3.0000 mL | Freq: Once | RESPIRATORY_TRACT | Status: AC
  Administered 2021-04-29: 3 mL via RESPIRATORY_TRACT
  Filled 2021-04-29: qty 3

## 2021-04-29 MED ORDER — POTASSIUM CHLORIDE CRYS ER 20 MEQ PO TBCR
40.0000 meq | EXTENDED_RELEASE_TABLET | Freq: Once | ORAL | Status: AC
Start: 2021-04-29 — End: 2021-04-29
  Administered 2021-04-29: 40 meq via ORAL
  Filled 2021-04-29: qty 2

## 2021-04-29 MED ORDER — PREDNISONE 20 MG PO TABS
60.0000 mg | ORAL_TABLET | Freq: Once | ORAL | Status: AC
  Administered 2021-04-29: 60 mg via ORAL
  Filled 2021-04-29: qty 3

## 2021-04-29 MED ORDER — PREDNISONE 50 MG PO TABS: 50.0000 mg | ORAL_TABLET | Freq: Every day | ORAL | 0 refills | Status: DC

## 2021-04-29 MED ORDER — LORAZEPAM 1 MG PO TABS
1.0000 mg | ORAL_TABLET | Freq: Once | ORAL | Status: AC
  Administered 2021-04-29: 1 mg via ORAL
  Filled 2021-04-29: qty 1

## 2021-04-29 NOTE — ED Notes (Signed)
Pt O2 saturations noted to fluctuate between 90-93% on room air. Inhaler administered and 2L O2 via nasal cannula applied.  ?

## 2021-04-29 NOTE — Discharge Instructions (Signed)
Continue using your inhaler as needed. ? ?Return if you are having symptoms which are not being adequately controlled at home. ?

## 2021-04-29 NOTE — ED Triage Notes (Signed)
Pt reported to ED with c/o shortness of breath and asthma exacerbation. Pt noted to speak in broken sentences and wheezing throughout. Pt endorses recent cough and phlegm production. Denies any chest pain at this time.  ?

## 2021-04-29 NOTE — ED Provider Notes (Signed)
?[facility] ?Provider Note ? ? ?CSN: [id number] ?Arrival date & time: 04/29/21  0135 ? ?  ? ?History ? ?Chief Complaint  ?Patient presents with  ? Shortness of Breath  ? ? ?[name] is a 61 y.o. female. ? ?The history is provided by the patient.  ?Shortness of Breath ?She has history of asthma, hyperlipidemia, pulmonary embolism, paroxysmal atrial fibrillation anticoagulated on apixaban and comes in with worsening asthma symptoms.  She has been having some wheezing for the last week which got worse today.  There has been cough productive of small amount of pale yellow sputum.  She denies fever, chills, sweats.  She denies chest pain, heaviness, tightness, pressure.  She had been using her albuterol inhaler which had been giving her relief until today.  She denies any sick contacts. ?  ?Home Medications ?Prior to Admission medications   ?Medication Sig Start Date End Date Taking? Authorizing Provider  ?albuterol (VENTOLIN HFA) 108 (90 Base) MCG/ACT inhaler 2 puffs every 6 hrs as needed ?Patient taking differently: Inhale 2 puffs into the lungs every 6 (six) hours as needed for wheezing or shortness of breath. 03/13/21   Biagio Borg, MD  ?alum & mag hydroxide-simeth (MAALOX/MYLANTA) 200-200-20 MG/5ML suspension Take 30 mLs by mouth every 6 (six) hours as needed for indigestion or heartburn.    [provider]  ?apixaban (ELIQUIS) 5 MG TABS tablet Take 1 tablet (5 mg total) by mouth 2 (two) times daily. 04/17/21   Sherran Needs, NP  ?atorvastatin (LIPITOR) 20 MG tablet Take 1 tablet (20 mg total) by mouth daily. 07/20/20 07/20/21  Biagio Borg, MD  ?chlordiazePOXIDE (LIBRIUM) 25 MG capsule '50mg'$  PO TID x 1D, then 25-'50mg'$  PO BID X 1D, then 25-'50mg'$  PO QD X 1D 04/09/21   Biagio Borg, MD  ?losartan-hydrochlorothiazide (HYZAAR) 100-25 MG tablet Take 1 tablet by mouth daily. 07/20/20   Biagio Borg, MD  ?metoprolol succinate (TOPROL-XL) 25 MG 24 hr tablet Take 1 tablet  (25 mg total) by mouth daily. 04/17/21   Sherran Needs, NP  ?omeprazole (PRILOSEC) 20 MG capsule Take 1 capsule (20 mg total) by mouth daily. 04/01/21   Fatima Blank, MD  ?ondansetron (ZOFRAN-ODT) 4 MG disintegrating tablet Take 4 mg by mouth every 8 (eight) hours as needed. 04/07/21   [provider]  ?Polyethyl Glycol-Propyl Glycol (SYSTANE OP) Place 1 drop into both eyes 2 (two) times daily as needed (dry eyes).    [provider]  ?potassium chloride (KLOR-CON M) 10 MEQ tablet Take 1 tablet (10 mEq total) by mouth as needed. 04/17/21   Sherran Needs, NP  ?triamcinolone cream (KENALOG) 0.1 % APPLY TO AFFECTED AREA TWICE A DAY ?Patient taking differently: Apply 1 application. topically 2 (two) times daily as needed (rash). 11/09/20   Biagio Borg, MD  ?   ? ?Allergies    ?Morphine, Shrimp [shellfish allergy], Tramadol, Covid-19 (mrna) vaccine, Diltiazem hcl, Other, and Latex   ? ?Review of Systems   ?Review of Systems  ?Respiratory:  Positive for shortness of breath.   ?All other systems reviewed and are negative. ? ?Physical Exam ?Updated Vital Signs ?BP (!) 163/75   Pulse (!) 106   Temp 97.7 ?F (36.5 ?C)   Resp (!) 24   SpO2 95%  ?Physical Exam ?Vitals and nursing note reviewed.  ?61 year old female, resting comfortably and in no acute distress. Vital signs are significant for elevated heart rate, respiratory  rate, blood pressure. Oxygen saturation is 95%, which is normal. ?Head is normocephalic and atraumatic. PERRLA, EOMI. Oropharynx is clear. ?Neck is nontender and supple without adenopathy or JVD. ?Back is nontender and there is no CVA tenderness. ?Lungs have mild expiratory wheezing without rales or rhonchi. ?Chest is nontender. ?Heart has regular rate and rhythm without murmur. ?Abdomen is soft, flat, nontender without masses or hepatosplenomegaly and peristalsis is normoactive. ?Extremities have trace edema, full range of motion is present. ?Skin is warm and dry without  rash. ?Neurologic: Mental status is normal, cranial nerves are intact, moves all extremities equally. ? ?ED Results / Procedures / Treatments   ?Labs ?(all labs ordered are listed, but only abnormal results are displayed) ?Labs Reviewed  ?BASIC METABOLIC PANEL - Abnormal; Notable for the following components:  ?    Result Value  ? Potassium 3.4 (*)   ? Glucose, Bld 123 (*)   ? Creatinine, Ser 1.05 (*)   ? All other components within normal limits  ?RESP PANEL BY RT-PCR (FLU A&B, COVID) ARPGX2  ?CBC  ?I-STAT BETA HCG BLOOD, ED ([facility], [facility], [facility] ONLY)  ? ? ?EKG ?EKG Interpretation ? ?Date/Time:  Monday April 29 2021 01:44:29 EDT ?Ventricular Rate:  102 ?PR Interval:  164 ?QRS Duration: 90 ?QT Interval:  366 ?QTC Calculation: 477 ?R Axis:   8 ?Text Interpretation: Sinus tachycardia Cannot rule out Anterior infarct , age undetermined Abnormal ECG When compared with ECG of 17-Apr-2021 09:52, No significant change was found Confirmed by Delora Fuel ([id number]) on 04/29/2021 2:43:46 AM ? ?Radiology ?DG Chest 2 View ? ?Result Date: 04/29/2021 ?CLINICAL DATA:  Shortness of breath, asthma exacerbation EXAM: CHEST - 2 VIEW COMPARISON:  03/31/2021 FINDINGS: Low lung volumes with vascular crowding. No pleural effusion or pneumothorax. Cardiomegaly. Visualized osseous structures are within normal limits. IMPRESSION: Low lung volumes.  No evidence of acute cardiopulmonary disease. Cardiomegaly. Electronically Signed   By: Julian Hy M.D.   On: 04/29/2021 03:40   ? ?Procedures ?Procedures  ? ? ?Medications Ordered in ED ?Medications  ?albuterol (VENTOLIN HFA) 108 (90 Base) MCG/ACT inhaler 2 puff (2 puffs Inhalation Given 04/29/21 0233)  ?predniSONE (DELTASONE) tablet 60 mg (has no administration in time range)  ?ipratropium-albuterol (DUONEB) 0.5-2.5 (3) MG/3ML nebulizer solution 3 mL (has no administration in time range)  ?LORazepam (ATIVAN) tablet 1 mg (has no administration in time range)  ?ipratropium-albuterol (DUONEB) 0.5-2.5 (3)  MG/3ML nebulizer solution 3 mL (3 mLs Nebulization Given 04/29/21 0157)  ? ? ?ED Course/ Medical Decision Making/ A&P ?  ?                        ?Medical Decision Making ?Amount and/or Complexity of Data Reviewed ?Labs: ordered. ?Radiology: ordered. ? ?Risk ?Prescription drug management. ? ? ?Asthma exacerbation.  Will check chest x-ray to rule out pneumonia.  She has received 1 nebulizer treatment with albuterol and ipratropium with partial relief of symptoms, second nebulizer treatment has been ordered as well as oral prednisone.  Labs have been ordered, and CBC is normal, metabolic panel and chest x-ray currently pending. ? ?Chest x-ray shows no evidence of pneumonia.  I have independently viewed the images, and agree with radiologist interpretation.  Metabolic panel shows mild hypokalemia, and she is given a dose of oral potassium.  Following second nebulizer treatment, lungs are completely clear and patient feels that she is back at her baseline.  Respiratory pathogen panel is negative for influenza and COVID-19.  She is  discharged with prescription for 5-day course of prednisone, follow-up with PCP as needed. ? ?Final Clinical Impression(s) / ED Diagnoses ?Final diagnoses:  ?Moderate persistent asthma with exacerbation  ?Hypokalemia  ?Chronic anticoagulation  ? ? ?Rx / DC Orders ?ED Discharge Orders   ? ?      Ordered  ?  predniSONE (DELTASONE) 50 MG tablet  Daily       ? 04/29/21 0417  ? ?  ?  ? ?  ? ? ?  ?Delora Fuel, MD ?41/75/30 [phone] ? ?

## 2021-05-07 ENCOUNTER — Telehealth: Payer: Self-pay | Admitting: Internal Medicine

## 2021-05-07 NOTE — Telephone Encounter (Signed)
1.Medication Requested: ondansetron (ZOFRAN-ODT) 4 MG disintegrating tablet ? ?omeprazole (PRILOSEC) 20 MG capsule ? ?2. Pharmacy (Name, Street, [place]): [id number] [place] ?3. On Med List: Y ? ?4. Last Visit with PCP: 03-13-2021 ? ?5. Next visit date with PCP: n/a ? ? ?Agent: Please be advised that RX refills may take up to 3 business days. We ask that you follow-up with your pharmacy.  ?

## 2021-05-08 NOTE — Telephone Encounter (Signed)
Left message for patient to call me back. Medications requested were not prescribed by Dr. Jenny Reichmann. Patient should contact GI doctor. ?

## 2021-05-23 ENCOUNTER — Ambulatory Visit: Payer: 59 | Admitting: Internal Medicine

## 2021-05-23 NOTE — Progress Notes (Deleted)
? ?[name], female    DOB: 09-Apr-1960      MRN: [id number] ? ? ?Brief patient profile:  ?39 yobf quit smoking in 1990 / MO asthma since childhood remotely seen by Tia Masker on advair 250 one daily and freq albuterol and then R Plantar fascitis around march 2020 and then toe surgery both sides May 6th 2020 then more sob May 15th > admitted 07/05/2018  ? ?Note spirometry 11/2012 nl  ? ? ?Admit date: 07/05/2018 ?Discharge date: 07/07/2018 ? ? ?Discharge Diagnoses:  ?  ?  Pulmonary embolism ([place]) ?   Extrinsic asthma ?  Hypertension ?  Elevated troponin  ?  ?Brief/Interim Summary: ? 61 y.o. female with history of hypertension, hyperlipidemia, asthma previous history of alcohol abuse states she only drinks occasionally nowadays presented to the ER because of nausea vomiting with some epigastric discomfort and burning sensation in the chest over the last 3 days PTA.     just recently had both  great toe nails removed in her left great toe nailbed has become more discolored but no discharge or pain. ?  ?ED Course:   CT angiogram of the chest which shows bilateral subsegmental pulmonary embolism with no strain pattern.  Patient was started on heparin and admitted for further work-up.  Labs also showed elevated LFTs with AST of 70 ALT of 55 bilirubin was 1.7.  CT angiogram of the chest done for PE also showed persistent lesion in the liver which as per the radiologist was possible hemangioma. ?  ?Hospital course: ?Pulmonary embolism with no strain pattern per CT chest.  Echocardiogram was done consistent with no severe strain noted, was started on a heparin drip and converted over to Eliquis on discharge.  Dopplers were checked which were negative but TDS.    ?Elevated troponin; these were marginally elevated at 0.04 and 0.03 the setting of PE.  They remained flat, no acute ischemic changes on EKG.  Patient will follow-up with her PCP for continued outpatient monitoring and further coronary stratification with referral to  cardiology. ?  ?  ? ?History of Present Illness  ?07/28/2018  Pulmonary/ 1st office eval/[name] re-establish re chronic doe/new pe ?Chief Complaint  ?Patient presents with  ? Consult  ?  Pulmonary Embolism-H/O asthma has not needed rescue inhaler.  ?Dyspnea:  MMRC1 = can walk nl pace, flat grade, can't hurry or go uphills or steps s sob   ?Cough: none ?Sleep: on back / 2 pillows  ?SABA use: none on advair one daily ?Re ?Call me if trouble getting the eliquis - you need to be on it for 6 full months ?Weight control is simply a matter of calorie balance ? ? ? ?01/14/2019  f/u ov/[name] re: PE  Dx 06/2018 on eliquis, asthma much better  ?Chief Complaint  ?Patient presents with  ? Follow-up  ?  Breathing is doing well today. She is using her proair inhaler 3 x per wk on average.   ?Dyspnea:  MMRC1 = can walk nl pace, flat grade, can't hurry or go uphills or steps s sob   ?Cough: none ?Sleeping: 2 pillows  ?SABA use: as above  ?rec ?Only use your albuterol as a rescue medication  ?GERD diet  ? ? ?03/02/2020  f/u ov/[name] re:  PE / dx  06/2018 and mild asthma on advair  hfa 115 one bid  ?Chief Complaint  ?Patient presents with  ? Follow-up  ?  Breathing is overall doing well. She uses her albuterol inhaler  about once per day. She states she has cough in the am- non prod.   ?Dyspnea: Not limited by breathing from desired activities but very sedentary   ?Cough: some throat clearing / some worse in am's attributes to overt HB  ?Sleeping: flat bed pillows  ?SABA use: rarely ?02: none   ?Rec ?Ok to renew your Eliquis and keep working on the weight loss  ?To get the most out of exercise, you need to be continuously aware that you are short of breath ?GERD diet /lifestyle  ? ? ? ?07/20/2020  f/u ov/[name] re: acute cough  ?Chief Complaint  ?Patient presents with  ? Acute Visit  ?  Productive cough for 2 days with green/ yellow mucus  ? Dyspnea: no change mild doe ?Cough: acute onset 6/8 with yellow mucus, no fever, neg covid test ?Sleeping:  ok p rob dm ?SABA use: no increase  ?02: none  ?Rec ?Zpak called into your pharmacy ?Prilosec 20 mg otc take 30 min before bfast and supper until cough is better then ok to stop ?No cough drops - use sugar free candy or lifesavers (not mint or methol products)  ?I will review your cxr and call if any other recommendations ?Call us next week if not better  ?Add:  cxr will need f/u either here or with PCP  ? ? ?09/03/2020  f/u ov/[name] re: mild asthma on advair 115  ?Chief Complaint  ?Patient presents with  ? Follow-up  ?  1 month, patient does not have any concerns, ?  ? Dyspnea:  able to shop but occ riding cart  ?Cough: gone  ?Sleeping: bed flat 3 pillows / sometimes gerd  ?SABA use: prn doe and typically  each  am "barely using one inhaler a month" ?02: none  ?Covid status:   vax x 3  ?Rec ?GERD diet reviewed, bed blocks rec  ?Only use your albuterol as a rescue medication  ?Ok to Try albuterol 15 min before an activity (on alternating days)  that you know would make you short of breath   ?Please schedule a follow up visit in 3 months but call sooner if needed  with all medications /inhalers/ solutions in hand so we can verify exactly what you are taking. This includes all medications from all doctors and over the counters  ? ?05/23/2021  f/u ov/[name] re: ***   maint on ***  ?No chief complaint on file. ?  ?Dyspnea:  *** ?Cough: *** ?Sleeping: *** ?SABA use: *** ?02: *** ?Covid status:   *** ? ? ?No obvious day to day or daytime variability or assoc excess/ purulent sputum or mucus plugs or hemoptysis or cp or chest tightness, subjective wheeze or overt sinus or hb symptoms.  ? ?*** without nocturnal  or early am exacerbation  of respiratory  c/o's or need for noct saba. Also denies any obvious fluctuation of symptoms with weather or environmental changes or other aggravating or alleviating factors except as outlined above  ? ?No unusual exposure hx or h/o childhood pna/ asthma or knowledge of premature  birth. ? ?Current Allergies, Complete Past Medical History, Past Surgical History, Family History, and Social History were reviewed in [employer] record. ? ?ROS  The following are not active complaints unless bolded ?Hoarseness, sore throat, dysphagia, dental problems, itching, sneezing,  nasal congestion or discharge of excess mucus or purulent secretions, ear ache,   fever, chills, sweats, unintended wt loss or wt gain, classically pleuritic or exertional cp,  orthopnea pnd or arm/hand swelling  or leg swelling, presyncope, palpitations, abdominal pain, anorexia, nausea, vomiting, diarrhea  or change in bowel habits or change in bladder habits, change in stools or change in urine, dysuria, hematuria,  rash, arthralgias, visual complaints, headache, numbness, weakness or ataxia or problems with walking or coordination,  change in mood or  memory. ?      ? ?No outpatient medications have been marked as taking for the 05/23/21 encounter (Appointment) with Tanda Rockers, MD.  ?    ?  ? ? ?  ? ?  ? ? ?  ? ?  ? ?Past Medical History:  ?Diagnosis Date  ? Alcohol abuse   ? abuse- moderate years ago - states only drinks now on weekends -2 to 8 driinks   ? Asthma   ? COLONIC POLYPS, HX OF 04/05/2010  ? DIVERTICULITIS, HX OF 04/05/2010  ? DJD (degenerative joint disease)   ? right knee, mot to severe  ? GERD (gastroesophageal reflux disease)   ? no meds  ? Heart murmur   ? hx of   ? Hyperlipidemia   ? Hypertension   ? Impaired glucose tolerance 12/06/2013  ? Obesity (BMI 30-39.9)   ? PALPITATIONS, HX OF 09/14/2007  ?  ?  ? ?Objective:  ?  ?Wts ? ?05/23/2021        ***  ? 09/03/2020       293  ?07/20/2020       293  ? 03/02/2020      300  ?01/14/19 290 lb (131.5 kg)  ?01/14/19 292 lb 9.6 oz (132.7 kg)  ?12/06/18 281 lb 9.6 oz (127.7 kg)  ?  ?  ? ? ?Vital signs reviewed  05/23/2021  - Note at rest 02 sats  ***% on ***  ? ?General appearance:    ***   ? ? ?  ? ?I personally reviewed images and agree with  radiology impression as follows:  ?CXR:   04/29/21 ?Low lung volumes.  No evidence of acute cardiopulmonary disease. ? Cardiomegaly. ?  ? ?   ?Assessment  ? ?  ?

## 2021-05-30 MED ORDER — OMEPRAZOLE 20 MG PO CPDR: 20.0000 mg | DELAYED_RELEASE_CAPSULE | Freq: Every day | ORAL | 3 refills | Status: DC

## 2021-05-30 MED ORDER — ONDANSETRON 4 MG PO TBDP: 4.0000 mg | ORAL_TABLET | Freq: Three times a day (TID) | ORAL | 2 refills | Status: DC | PRN

## 2021-05-30 NOTE — Telephone Encounter (Signed)
Unable to reach patient to notify ?

## 2021-05-30 NOTE — Telephone Encounter (Signed)
Pt checking status of refill request ? ?Advised pt of cmas mssg below ? ?Pt states medication was prescribed during an er visit ? ?Pt requesting provider to take over rx ? ?Pt requesting a cb ?

## 2021-05-30 NOTE — Telephone Encounter (Signed)
Ok done

## 2021-06-13 ENCOUNTER — Emergency Department (HOSPITAL_COMMUNITY): Payer: 59

## 2021-06-13 ENCOUNTER — Emergency Department (HOSPITAL_COMMUNITY)
Admission: EM | Admit: 2021-06-13 | Discharge: 2021-06-14 | Disposition: A | Discharge status: Home / Self Care | Payer: 59 | Attending: Emergency Medicine | Admitting: Emergency Medicine

## 2021-06-13 DIAGNOSIS — I700 Atherosclerosis of aorta: Secondary | ICD-10-CM | POA: Diagnosis not present

## 2021-06-13 DIAGNOSIS — N200 Calculus of kidney: Secondary | ICD-10-CM | POA: Insufficient documentation

## 2021-06-13 DIAGNOSIS — Z91040 Latex allergy status: Secondary | ICD-10-CM | POA: Diagnosis not present

## 2021-06-13 DIAGNOSIS — Z7901 Long term (current) use of anticoagulants: Secondary | ICD-10-CM | POA: Diagnosis not present

## 2021-06-13 DIAGNOSIS — R109 Unspecified abdominal pain: Secondary | ICD-10-CM | POA: Diagnosis not present

## 2021-06-13 DIAGNOSIS — R1110 Vomiting, unspecified: Secondary | ICD-10-CM | POA: Diagnosis not present

## 2021-06-13 DIAGNOSIS — R1084 Generalized abdominal pain: Secondary | ICD-10-CM | POA: Diagnosis not present

## 2021-06-13 LAB — CBC WITH DIFFERENTIAL/PLATELET
Abs Immature Granulocytes: 0.08 10*3/uL — ABNORMAL HIGH (ref 0.00–0.07)
Basophils Absolute: 0.1 10*3/uL (ref 0.0–0.1)
Basophils Relative: 1 %
Eosinophils Absolute: 0.1 10*3/uL (ref 0.0–0.5)
Eosinophils Relative: 1 %
HCT: 41.4 % (ref 36.0–46.0)
Hemoglobin: 13.7 g/dL (ref 12.0–15.0)
Immature Granulocytes: 1 %
Lymphocytes Relative: 9 %
Lymphs Abs: 1.4 10*3/uL (ref 0.7–4.0)
MCH: 29.9 pg (ref 26.0–34.0)
MCHC: 33.1 g/dL (ref 30.0–36.0)
MCV: 90.4 fL (ref 80.0–100.0)
Monocytes Absolute: 0.8 10*3/uL (ref 0.1–1.0)
Monocytes Relative: 5 %
Neutro Abs: 12.1 10*3/uL — ABNORMAL HIGH (ref 1.7–7.7)
Neutrophils Relative %: 83 %
Platelets: 251 10*3/uL (ref 150–400)
RBC: 4.58 MIL/uL (ref 3.87–5.11)
RDW: 14.3 % (ref 11.5–15.5)
WBC: 14.5 10*3/uL — ABNORMAL HIGH (ref 4.0–10.5)
nRBC: 0 % (ref 0.0–0.2)

## 2021-06-13 LAB — COMPREHENSIVE METABOLIC PANEL
ALT: 55 U/L — ABNORMAL HIGH (ref 0–44)
AST: 57 U/L — ABNORMAL HIGH (ref 15–41)
Albumin: 3.2 g/dL — ABNORMAL LOW (ref 3.5–5.0)
Alkaline Phosphatase: 110 U/L (ref 38–126)
Anion gap: 13 (ref 5–15)
BUN: 20 mg/dL (ref 6–20)
CO2: 25 mmol/L (ref 22–32)
Calcium: 9.1 mg/dL (ref 8.9–10.3)
Chloride: 100 mmol/L (ref 98–111)
Creatinine, Ser: 1.76 mg/dL — ABNORMAL HIGH (ref 0.44–1.00)
GFR, Estimated: 33 mL/min — ABNORMAL LOW (ref >60)
Glucose, Bld: 106 mg/dL — ABNORMAL HIGH (ref 70–99)
Potassium: 3.5 mmol/L (ref 3.5–5.1)
Sodium: 138 mmol/L (ref 135–145)
Total Bilirubin: 0.7 mg/dL (ref 0.3–1.2)
Total Protein: 7.1 g/dL (ref 6.5–8.1)

## 2021-06-13 LAB — LIPASE, BLOOD: Lipase: 30 U/L (ref 11–51)

## 2021-06-13 MED ORDER — METOCLOPRAMIDE HCL 5 MG/ML IJ SOLN
10.0000 mg | Freq: Once | INTRAMUSCULAR | Status: AC
  Administered 2021-06-13: 10 mg via INTRAMUSCULAR
  Filled 2021-06-13: qty 2

## 2021-06-13 MED ORDER — IOHEXOL 300 MG/ML  SOLN
80.0000 mL | Freq: Once | INTRAMUSCULAR | Status: AC | PRN
Start: 2021-06-13 — End: 2021-06-13
  Administered 2021-06-13: 80 mL via INTRAVENOUS

## 2021-06-13 MED ORDER — ONDANSETRON 4 MG PO TBDP: 4.0000 mg | ORAL_TABLET | Freq: Once | ORAL | Status: DC

## 2021-06-13 MED ORDER — HYDROCODONE-ACETAMINOPHEN 5-325 MG PO TABS
2.0000 | ORAL_TABLET | Freq: Once | ORAL | Status: AC
  Administered 2021-06-13: 2 via ORAL
  Filled 2021-06-13: qty 2

## 2021-06-13 NOTE — ED Provider Triage Note (Signed)
Emergency Medicine Provider Triage Evaluation Note ? ?[name] , a 61 y.o. female  was evaluated in triage.  Pt complains of abdominal pain and vomiting.  She had onset of abdominal pain and innumerable episodes of nonbloody nonbilious vomitus.  She states that she has not been able to hold anything down.  She has had worsening distention.  She has a history of previous abdominal abscess requiring percutaneous drain and diverticulitis.  She denies diarrhea or constipation. ? ?Review of Systems  ?Positive: Abdominal pain and vomiting ?Negative: Fever or chest pain ? ?Physical Exam  ?BP (!) 175/103 (BP Location: Left Arm)   Pulse 96   Temp 98.2 ?F (36.8 ?C) (Oral)   Resp (!) 22   SpO2 95%  ?Gen:   Awake, no distress   ?Resp:  Normal effort  ?MSK:   Moves extremities without difficulty  ?Other:  Patient is actively vomiting during assessment ? ?Medical Decision Making  ?Medically screening exam initiated at 8:14 PM.  Appropriate orders placed.  [name] was informed that the remainder of the evaluation will be completed by another provider, this initial triage assessment does not replace that evaluation, and the importance of remaining in the ED until their evaluation is complete. ? ?Abd pain -work-up initiated ?  ?Margarita Mail, PA-C ?06/13/21 2020 ? ?

## 2021-06-13 NOTE — ED Notes (Signed)
Patient in CT at this time.

## 2021-06-13 NOTE — ED Triage Notes (Signed)
Pt reported to ED with right sided abdominal pain since this morning accompanied by nausea, vomiting and dry heaves.  ?

## 2021-06-14 ENCOUNTER — Other Ambulatory Visit: Payer: Self-pay

## 2021-06-14 ENCOUNTER — Other Ambulatory Visit (HOSPITAL_COMMUNITY): Payer: Self-pay

## 2021-06-14 LAB — URINALYSIS, ROUTINE W REFLEX MICROSCOPIC
Bilirubin Urine: NEGATIVE
Glucose, UA: NEGATIVE mg/dL
Ketones, ur: NEGATIVE mg/dL
Nitrite: NEGATIVE
Protein, ur: NEGATIVE mg/dL
RBC / HPF: >50 RBC/hpf — ABNORMAL HIGH (ref 0–5)
Specific Gravity, Urine: 1.038 — ABNORMAL HIGH (ref 1.005–1.030)
pH: 5 (ref 5.0–8.0)

## 2021-06-14 MED ORDER — CEPHALEXIN 500 MG PO CAPS
500.0000 mg | ORAL_CAPSULE | Freq: Four times a day (QID) | ORAL | 0 refills | Status: AC
Start: 2021-06-14 — End: 2021-06-24
  Filled 2021-06-14: qty 40, 10d supply, fill #0

## 2021-06-14 MED ORDER — TAMSULOSIN HCL 0.4 MG PO CAPS
0.4000 mg | ORAL_CAPSULE | Freq: Every day | ORAL | 0 refills | Status: AC
  Filled 2021-06-14: qty 7, 7d supply, fill #0

## 2021-06-14 MED ORDER — ONDANSETRON 4 MG PO TBDP
4.0000 mg | ORAL_TABLET | Freq: Three times a day (TID) | ORAL | 0 refills | Status: DC | PRN
  Filled 2021-06-14: qty 18, 6d supply, fill #0

## 2021-06-14 MED ORDER — HYDROCODONE-ACETAMINOPHEN 5-325 MG PO TABS
2.0000 | ORAL_TABLET | Freq: Once | ORAL | Status: DC
Start: 2021-06-14 — End: 2021-06-14

## 2021-06-14 MED ORDER — ONDANSETRON 4 MG PO TBDP: 4.0000 mg | ORAL_TABLET | Freq: Once | ORAL | Status: DC

## 2021-06-14 MED ORDER — HYDROCODONE-ACETAMINOPHEN 5-325 MG PO TABS
1.0000 | ORAL_TABLET | ORAL | 0 refills | Status: DC | PRN
  Filled 2021-06-14: qty 20, 4d supply, fill #0

## 2021-06-14 NOTE — ED Provider Notes (Signed)
?[facility] ?Provider Note ? ? ?CSN: [id number] ?Arrival date & time: 06/13/21  1927 ? ?  ? ?History ? ?Chief Complaint  ?Patient presents with  ? Abdominal Pain  ? ? ?[name] is a 61 y.o. female. ? ?Pt complains of lower abdominal pain  ? ?The history is provided by the patient. No language interpreter was used.  ?Abdominal Pain ?Pain location:  Generalized ?Pain quality: sharp and stabbing   ?Pain radiates to:  Does not radiate ?Pain severity:  Moderate ?Onset quality:  Gradual ?Timing:  Constant ?Progression:  Worsening ?Chronicity:  New ?Context: not recent illness   ?Relieved by:  Nothing ?Ineffective treatments:  None tried ? ?  ? ?Home Medications ?Prior to Admission medications   ?Medication Sig Start Date End Date Taking? Authorizing Provider  ?acetaminophen (TYLENOL) 500 MG tablet Take 250 mg by mouth every 6 (six) hours as needed for headache or moderate pain.    [provider]  ?albuterol (VENTOLIN HFA) 108 (90 Base) MCG/ACT inhaler 2 puffs every 6 hrs as needed ?Patient taking differently: Inhale 2 puffs into the lungs every 6 (six) hours as needed for wheezing or shortness of breath. 03/13/21   Biagio Borg, MD  ?alum & mag hydroxide-simeth (MAALOX/MYLANTA) 200-200-20 MG/5ML suspension Take 30 mLs by mouth every 6 (six) hours as needed for indigestion or heartburn.    [provider]  ?apixaban (ELIQUIS) 5 MG TABS tablet Take 1 tablet (5 mg total) by mouth 2 (two) times daily. 04/17/21   Sherran Needs, NP  ?atorvastatin (LIPITOR) 20 MG tablet Take 1 tablet (20 mg total) by mouth daily. 07/20/20 07/20/21  Biagio Borg, MD  ?chlordiazePOXIDE (LIBRIUM) 25 MG capsule '50mg'$  PO TID x 1D, then 25-'50mg'$  PO BID X 1D, then 25-'50mg'$  PO QD X 1D ?Patient not taking: Reported on 04/29/2021 04/09/21   Biagio Borg, MD  ?losartan-hydrochlorothiazide [facility]) 100-25 MG tablet Take 1 tablet by mouth daily. 07/20/20   Biagio Borg, MD  ?metoprolol succinate  (TOPROL-XL) 25 MG 24 hr tablet Take 1 tablet (25 mg total) by mouth daily. 04/17/21   Sherran Needs, NP  ?omeprazole (PRILOSEC) 20 MG capsule Take 1 capsule (20 mg total) by mouth daily. 05/30/21   Biagio Borg, MD  ?ondansetron (ZOFRAN-ODT) 4 MG disintegrating tablet Take 1 tablet (4 mg total) by mouth every 8 (eight) hours as needed. 05/30/21   Biagio Borg, MD  ?Polyethyl Glycol-Propyl Glycol (SYSTANE OP) Place 1 drop into both eyes 2 (two) times daily as needed (dry eyes).    [provider]  ?potassium chloride (KLOR-CON M) 10 MEQ tablet Take 1 tablet (10 mEq total) by mouth as needed. ?Patient taking differently: Take 10 mEq by mouth as needed (low potassium). 04/17/21   Sherran Needs, NP  ?predniSONE (DELTASONE) 50 MG tablet Take 1 tablet (50 mg total) by mouth daily. 07/08/39   Delora Fuel, MD  ?triamcinolone cream (KENALOG) 0.1 % APPLY TO AFFECTED AREA TWICE A DAY ?Patient taking differently: Apply 1 application. topically 2 (two) times daily as needed (rash). 11/09/20   Biagio Borg, MD  ?   ? ?Allergies    ?Morphine, Shrimp [shellfish allergy], Tramadol, Covid-19 (mrna) vaccine, Diltiazem hcl, Other, and Latex   ? ?Review of Systems   ?Review of Systems  ?Gastrointestinal:  Positive for abdominal pain.  ?All other systems reviewed and are negative. ? ?Physical Exam ?Updated Vital Signs ?BP (!) 191/105 (BP Location: Right  Arm)   Pulse (!) 103   Temp 98.4 ?F (36.9 ?C) (Oral)   Resp 17   SpO2 93%  ?Physical Exam ?Vitals and nursing note reviewed.  ?Constitutional:   ?   Appearance: She is well-developed.  ?HENT:  ?   Head: Normocephalic.  ?Cardiovascular:  ?   Rate and Rhythm: Normal rate.  ?Pulmonary:  ?   Effort: Pulmonary effort is normal.  ?Abdominal:  ?   General: Abdomen is flat. Bowel sounds are normal. There is no distension.  ?   Palpations: Abdomen is soft.  ?   Tenderness: There is generalized abdominal tenderness.  ?Musculoskeletal:     ?   General: Normal range of motion.  ?    Cervical back: Normal range of motion.  ?Skin: ?   General: Skin is warm.  ?Neurological:  ?   General: No focal deficit present.  ?   Mental Status: She is alert and oriented to person, place, and time.  ? ? ?ED Results / Procedures / Treatments   ?Labs ?(all labs ordered are listed, but only abnormal results are displayed) ?Labs Reviewed  ?CBC WITH DIFFERENTIAL/PLATELET - Abnormal; Notable for the following components:  ?    Result Value  ? WBC 14.5 (*)   ? Neutro Abs 12.1 (*)   ? Abs Immature Granulocytes 0.08 (*)   ? All other components within normal limits  ?COMPREHENSIVE METABOLIC PANEL - Abnormal; Notable for the following components:  ? Glucose, Bld 106 (*)   ? Creatinine, Ser 1.76 (*)   ? Albumin 3.2 (*)   ? AST 57 (*)   ? ALT 55 (*)   ? GFR, Estimated 33 (*)   ? All other components within normal limits  ?URINALYSIS, ROUTINE W REFLEX MICROSCOPIC - Abnormal; Notable for the following components:  ? APPearance HAZY (*)   ? Specific Gravity, Urine 1.038 (*)   ? Hgb urine dipstick LARGE (*)   ? Leukocytes,Ua SMALL (*)   ? RBC / HPF >50 (*)   ? Bacteria, UA RARE (*)   ? All other components within normal limits  ?LIPASE, BLOOD  ? ? ?EKG ?None ? ?Radiology ?CT Abdomen Pelvis W Contrast ? ?Result Date: 06/14/2021 ?CLINICAL DATA:  Nausea, vomiting and right abdominal pain. EXAM: CT ABDOMEN AND PELVIS WITH CONTRAST TECHNIQUE: Multidetector CT imaging of the abdomen and pelvis was performed using the standard protocol following bolus administration of intravenous contrast. RADIATION DOSE REDUCTION: This exam was performed according to the departmental dose-optimization program which includes automated exposure control, adjustment of the mA and/or kV according to patient size and/or use of iterative reconstruction technique. CONTRAST:  96m OMNIPAQUE IOHEXOL 300 MG/ML  SOLN COMPARISON:  CT with IV contrast 06/09/2018 FINDINGS: Lower chest: There is posterior atelectasis in the lower lobes without acute findings. The  heart is moderately enlarged with left chamber predominance. There are calcifications in the aortic valve. There is a partially visible subcarinal lymph node complex measuring 3.8 x 2.1 cm, but this was also seen on a CTA chest of 07/06/2018 and is unchanged as visualized. Hepatobiliary: There are stable hepatic hemangiomas including a 3.1 cm hemangioma in the liver dome, another measuring 1.2 cm more anteriorly in the right lobe on 3:25, another measuring 1.8 cm also in the right lobe anterior segment on 3:30 and another measuring 1.5 cm on 3:23. The liver is enlarged measuring 26 cm in length and severely steatotic, but only slightly larger than previously. Unremarkable gallbladder and bile ducts. No  mass enhancement. Pancreas: No mass enhancement or inflammatory change. Spleen: No splenomegaly or mass enhancement. Adrenals/Urinary Tract: There is no adrenal mass. There is bilateral cortical contrast retention, relatively decreased enhancement over portions of the left kidney concerning for pyelonephritis, with bilateral perinephric edema. There is mild bilateral hydronephrosis and there bilateral obstructing stones, on the right with a 2 mm stone lodged at the UVJ, and on the left there is larger oval UPJ stone measuring 1.5 x 1 x 0.7 cm. On the right there are punctate up to 3 mm nonobstructive caliceal stones in the inferior pole. On the left there are scattered punctate nonobstructing stones in the collecting system, larger 2 mm stone in the inferior pole and a 6 mm stone more cephalad in the lower pole. The bladder is contracted poorly demonstrated. It appears smaller than normal in size. There is suspected trace air in the lumen and close abutment with the distal sigmoid colon lateral wall as was seen previously. Stomach/Bowel: Unremarkable unopacified stomach and small bowel. There are uncomplicated colonic diverticula. The tract of a prior colovaginal fistula with the distal sigmoid colon is again visible  on the left but whether or not the fistula is patent is indeterminate. The appendix is normal caliber. Vascular/Lymphatic: Aortic atherosclerosis. No enlarged abdominal or pelvic lymph nodes. Reproductive: The ute

## 2021-06-14 NOTE — Discharge Instructions (Addendum)
Return if any problems.

## 2021-06-21 ENCOUNTER — Other Ambulatory Visit (HOSPITAL_COMMUNITY): Payer: Self-pay

## 2021-06-21 ENCOUNTER — Other Ambulatory Visit: Payer: Self-pay | Admitting: Internal Medicine

## 2021-06-30 ENCOUNTER — Other Ambulatory Visit: Payer: Self-pay | Admitting: Internal Medicine

## 2021-07-01 ENCOUNTER — Other Ambulatory Visit: Payer: Self-pay | Admitting: Internal Medicine

## 2021-07-31 ENCOUNTER — Other Ambulatory Visit: Payer: Self-pay | Admitting: Internal Medicine

## 2021-08-05 ENCOUNTER — Other Ambulatory Visit: Payer: Self-pay | Admitting: Internal Medicine

## 2021-09-02 ENCOUNTER — Encounter (HOSPITAL_COMMUNITY): Payer: Self-pay | Admitting: Emergency Medicine

## 2021-09-02 ENCOUNTER — Inpatient Hospital Stay (HOSPITAL_COMMUNITY)
Admission: EM | Admit: 2021-09-02 | Discharge: 2021-09-10 | DRG: 854 | Disposition: A | Discharge status: Home / Self Care | Payer: 59 | Attending: Internal Medicine | Admitting: Internal Medicine

## 2021-09-02 ENCOUNTER — Other Ambulatory Visit: Payer: Self-pay

## 2021-09-02 DIAGNOSIS — Z6841 Body Mass Index (BMI) 40.0 and over, adult: Secondary | ICD-10-CM | POA: Diagnosis not present

## 2021-09-02 DIAGNOSIS — Z86711 Personal history of pulmonary embolism: Secondary | ICD-10-CM | POA: Diagnosis present

## 2021-09-02 DIAGNOSIS — R6520 Severe sepsis without septic shock: Secondary | ICD-10-CM | POA: Diagnosis present

## 2021-09-02 DIAGNOSIS — E872 Acidosis, unspecified: Secondary | ICD-10-CM | POA: Diagnosis present

## 2021-09-02 DIAGNOSIS — Z885 Allergy status to narcotic agent status: Secondary | ICD-10-CM

## 2021-09-02 DIAGNOSIS — R69 Illness, unspecified: Secondary | ICD-10-CM | POA: Diagnosis not present

## 2021-09-02 DIAGNOSIS — E869 Volume depletion, unspecified: Secondary | ICD-10-CM | POA: Diagnosis present

## 2021-09-02 DIAGNOSIS — N1831 Chronic kidney disease, stage 3a: Secondary | ICD-10-CM | POA: Diagnosis present

## 2021-09-02 DIAGNOSIS — D72829 Elevated white blood cell count, unspecified: Secondary | ICD-10-CM | POA: Diagnosis present

## 2021-09-02 DIAGNOSIS — Z888 Allergy status to other drugs, medicaments and biological substances status: Secondary | ICD-10-CM | POA: Diagnosis not present

## 2021-09-02 DIAGNOSIS — Z91040 Latex allergy status: Secondary | ICD-10-CM | POA: Diagnosis not present

## 2021-09-02 DIAGNOSIS — Z811 Family history of alcohol abuse and dependence: Secondary | ICD-10-CM

## 2021-09-02 DIAGNOSIS — Z86718 Personal history of other venous thrombosis and embolism: Secondary | ICD-10-CM

## 2021-09-02 DIAGNOSIS — F10231 Alcohol dependence with withdrawal delirium: Secondary | ICD-10-CM | POA: Diagnosis not present

## 2021-09-02 DIAGNOSIS — E875 Hyperkalemia: Secondary | ICD-10-CM | POA: Diagnosis present

## 2021-09-02 DIAGNOSIS — J4520 Mild intermittent asthma, uncomplicated: Secondary | ICD-10-CM | POA: Diagnosis present

## 2021-09-02 DIAGNOSIS — N12 Tubulo-interstitial nephritis, not specified as acute or chronic: Secondary | ICD-10-CM | POA: Diagnosis not present

## 2021-09-02 DIAGNOSIS — N10 Acute tubulo-interstitial nephritis: Secondary | ICD-10-CM | POA: Diagnosis not present

## 2021-09-02 DIAGNOSIS — N179 Acute kidney failure, unspecified: Secondary | ICD-10-CM

## 2021-09-02 DIAGNOSIS — A419 Sepsis, unspecified organism: Secondary | ICD-10-CM | POA: Diagnosis not present

## 2021-09-02 DIAGNOSIS — B9620 Unspecified Escherichia coli [E. coli] as the cause of diseases classified elsewhere: Secondary | ICD-10-CM | POA: Diagnosis not present

## 2021-09-02 DIAGNOSIS — A4151 Sepsis due to Escherichia coli [E. coli]: Principal | ICD-10-CM | POA: Diagnosis present

## 2021-09-02 DIAGNOSIS — N1330 Unspecified hydronephrosis: Secondary | ICD-10-CM | POA: Diagnosis not present

## 2021-09-02 DIAGNOSIS — I10 Essential (primary) hypertension: Secondary | ICD-10-CM | POA: Diagnosis not present

## 2021-09-02 DIAGNOSIS — Z87891 Personal history of nicotine dependence: Secondary | ICD-10-CM | POA: Diagnosis not present

## 2021-09-02 DIAGNOSIS — N139 Obstructive and reflux uropathy, unspecified: Secondary | ICD-10-CM | POA: Diagnosis not present

## 2021-09-02 DIAGNOSIS — F10232 Alcohol dependence with withdrawal with perceptual disturbance: Secondary | ICD-10-CM | POA: Diagnosis not present

## 2021-09-02 DIAGNOSIS — R000 Tachycardia, unspecified: Secondary | ICD-10-CM | POA: Diagnosis not present

## 2021-09-02 DIAGNOSIS — R0602 Shortness of breath: Secondary | ICD-10-CM | POA: Diagnosis not present

## 2021-09-02 DIAGNOSIS — N134 Hydroureter: Secondary | ICD-10-CM | POA: Diagnosis not present

## 2021-09-02 DIAGNOSIS — Z96653 Presence of artificial knee joint, bilateral: Secondary | ICD-10-CM | POA: Diagnosis present

## 2021-09-02 DIAGNOSIS — I480 Paroxysmal atrial fibrillation: Secondary | ICD-10-CM | POA: Diagnosis not present

## 2021-09-02 DIAGNOSIS — N189 Chronic kidney disease, unspecified: Secondary | ICD-10-CM | POA: Diagnosis not present

## 2021-09-02 DIAGNOSIS — F10932 Alcohol use, unspecified with withdrawal with perceptual disturbance: Principal | ICD-10-CM

## 2021-09-02 DIAGNOSIS — Y900 Blood alcohol level of less than 20 mg/100 ml: Secondary | ICD-10-CM | POA: Diagnosis present

## 2021-09-02 DIAGNOSIS — N136 Pyonephrosis: Secondary | ICD-10-CM | POA: Diagnosis not present

## 2021-09-02 DIAGNOSIS — R7881 Bacteremia: Secondary | ICD-10-CM | POA: Clinically undetermined

## 2021-09-02 DIAGNOSIS — I129 Hypertensive chronic kidney disease with stage 1 through stage 4 chronic kidney disease, or unspecified chronic kidney disease: Secondary | ICD-10-CM | POA: Diagnosis not present

## 2021-09-02 DIAGNOSIS — R7302 Impaired glucose tolerance (oral): Secondary | ICD-10-CM | POA: Diagnosis present

## 2021-09-02 DIAGNOSIS — E785 Hyperlipidemia, unspecified: Secondary | ICD-10-CM | POA: Diagnosis not present

## 2021-09-02 DIAGNOSIS — Z87442 Personal history of urinary calculi: Secondary | ICD-10-CM

## 2021-09-02 DIAGNOSIS — E7800 Pure hypercholesterolemia, unspecified: Secondary | ICD-10-CM | POA: Diagnosis not present

## 2021-09-02 DIAGNOSIS — E861 Hypovolemia: Secondary | ICD-10-CM | POA: Diagnosis not present

## 2021-09-02 DIAGNOSIS — J45909 Unspecified asthma, uncomplicated: Secondary | ICD-10-CM | POA: Diagnosis present

## 2021-09-02 DIAGNOSIS — Z79899 Other long term (current) drug therapy: Secondary | ICD-10-CM

## 2021-09-02 DIAGNOSIS — N201 Calculus of ureter: Secondary | ICD-10-CM | POA: Diagnosis not present

## 2021-09-02 DIAGNOSIS — Z91013 Allergy to seafood: Secondary | ICD-10-CM

## 2021-09-02 DIAGNOSIS — K219 Gastro-esophageal reflux disease without esophagitis: Secondary | ICD-10-CM | POA: Diagnosis present

## 2021-09-02 DIAGNOSIS — Z7901 Long term (current) use of anticoagulants: Secondary | ICD-10-CM

## 2021-09-02 DIAGNOSIS — D631 Anemia in chronic kidney disease: Secondary | ICD-10-CM | POA: Diagnosis not present

## 2021-09-02 DIAGNOSIS — N390 Urinary tract infection, site not specified: Secondary | ICD-10-CM | POA: Diagnosis not present

## 2021-09-02 HISTORY — DX: Morbid (severe) obesity due to excess calories: E66.01

## 2021-09-02 LAB — COMPREHENSIVE METABOLIC PANEL
ALT: 58 U/L — ABNORMAL HIGH (ref 0–44)
AST: 67 U/L — ABNORMAL HIGH (ref 15–41)
Albumin: 3 g/dL — ABNORMAL LOW (ref 3.5–5.0)
Alkaline Phosphatase: 129 U/L — ABNORMAL HIGH (ref 38–126)
Anion gap: 14 (ref 5–15)
BUN: 30 mg/dL — ABNORMAL HIGH (ref 6–20)
CO2: 22 mmol/L (ref 22–32)
Calcium: 8.9 mg/dL (ref 8.9–10.3)
Chloride: 105 mmol/L (ref 98–111)
Creatinine, Ser: 3.58 mg/dL — ABNORMAL HIGH (ref 0.44–1.00)
GFR, Estimated: 14 mL/min — ABNORMAL LOW (ref >60)
Glucose, Bld: 110 mg/dL — ABNORMAL HIGH (ref 70–99)
Potassium: 4.4 mmol/L (ref 3.5–5.1)
Sodium: 141 mmol/L (ref 135–145)
Total Bilirubin: 0.5 mg/dL (ref 0.3–1.2)
Total Protein: 7.3 g/dL (ref 6.5–8.1)

## 2021-09-02 LAB — CBC
HCT: 39.5 % (ref 36.0–46.0)
Hemoglobin: 12.9 g/dL (ref 12.0–15.0)
MCH: 29.8 pg (ref 26.0–34.0)
MCHC: 32.7 g/dL (ref 30.0–36.0)
MCV: 91.2 fL (ref 80.0–100.0)
Platelets: 270 10*3/uL (ref 150–400)
RBC: 4.33 MIL/uL (ref 3.87–5.11)
RDW: 14.8 % (ref 11.5–15.5)
WBC: 13.5 10*3/uL — ABNORMAL HIGH (ref 4.0–10.5)
nRBC: 0 % (ref 0.0–0.2)

## 2021-09-02 LAB — LIPASE, BLOOD: Lipase: 24 U/L (ref 11–51)

## 2021-09-02 LAB — RAPID URINE DRUG SCREEN, HOSP PERFORMED
Amphetamines: NOT DETECTED
Barbiturates: NOT DETECTED
Benzodiazepines: NOT DETECTED
Cocaine: NOT DETECTED
Opiates: POSITIVE — AB
Tetrahydrocannabinol: NOT DETECTED

## 2021-09-02 LAB — ETHANOL: Alcohol, Ethyl (B): <10 mg/dL (ref <10)

## 2021-09-02 LAB — I-STAT BETA HCG BLOOD, ED (MC, WL, AP ONLY): I-stat hCG, quantitative: <5 m[IU]/mL

## 2021-09-02 MED ORDER — ADULT MULTIVITAMIN W/MINERALS CH
1.0000 | ORAL_TABLET | Freq: Every day | ORAL | Status: DC
  Administered 2021-09-03 – 2021-09-10 (×8): 1 via ORAL
  Filled 2021-09-02 (×8): qty 1

## 2021-09-02 MED ORDER — ALUM & MAG HYDROXIDE-SIMETH 200-200-20 MG/5ML PO SUSP
30.0000 mL | Freq: Once | ORAL | Status: AC
  Administered 2021-09-02: 30 mL via ORAL
  Filled 2021-09-02: qty 30

## 2021-09-02 MED ORDER — THIAMINE HCL 100 MG PO TABS
100.0000 mg | ORAL_TABLET | Freq: Every day | ORAL | Status: DC
  Administered 2021-09-03 – 2021-09-10 (×8): 100 mg via ORAL
  Filled 2021-09-02 (×7): qty 1

## 2021-09-02 MED ORDER — SODIUM CHLORIDE 0.9% FLUSH
3.0000 mL | Freq: Two times a day (BID) | INTRAVENOUS | Status: DC
Start: 2021-09-02 — End: 2021-09-10
  Administered 2021-09-04 – 2021-09-10 (×12): 3 mL via INTRAVENOUS

## 2021-09-02 MED ORDER — DIAZEPAM 5 MG PO TABS
5.0000 mg | ORAL_TABLET | Freq: Four times a day (QID) | ORAL | Status: DC
  Administered 2021-09-02 – 2021-09-06 (×15): 5 mg via ORAL
  Filled 2021-09-02 (×15): qty 1

## 2021-09-02 MED ORDER — HYDROCODONE-ACETAMINOPHEN 5-325 MG PO TABS: 1.0000 | ORAL_TABLET | ORAL | Status: DC | PRN

## 2021-09-02 MED ORDER — LORAZEPAM 1 MG PO TABS: 1.0000 mg | ORAL_TABLET | ORAL | Status: DC | PRN

## 2021-09-02 MED ORDER — THIAMINE HCL 100 MG/ML IJ SOLN: 100.0000 mg | Freq: Every day | INTRAMUSCULAR | Status: DC

## 2021-09-02 MED ORDER — FOLIC ACID 1 MG PO TABS
1.0000 mg | ORAL_TABLET | Freq: Every day | ORAL | Status: DC
  Administered 2021-09-02: 1 mg via ORAL
  Filled 2021-09-02: qty 1

## 2021-09-02 MED ORDER — ONDANSETRON HCL 4 MG/2ML IJ SOLN
4.0000 mg | Freq: Once | INTRAMUSCULAR | Status: AC
  Administered 2021-09-02: 4 mg via INTRAVENOUS
  Filled 2021-09-02: qty 2

## 2021-09-02 MED ORDER — ONDANSETRON HCL 4 MG/2ML IJ SOLN
4.0000 mg | Freq: Four times a day (QID) | INTRAMUSCULAR | Status: DC | PRN
  Administered 2021-09-03 – 2021-09-04 (×2): 4 mg via INTRAVENOUS
  Filled 2021-09-02 (×2): qty 2

## 2021-09-02 MED ORDER — ONDANSETRON HCL 4 MG PO TABS
4.0000 mg | ORAL_TABLET | Freq: Four times a day (QID) | ORAL | Status: DC | PRN
  Administered 2021-09-03: 4 mg via ORAL
  Filled 2021-09-02: qty 1

## 2021-09-02 MED ORDER — LORAZEPAM 1 MG PO TABS: 1.0000 mg | ORAL_TABLET | ORAL | Status: AC | PRN

## 2021-09-02 MED ORDER — LACTATED RINGERS IV SOLN: INTRAVENOUS | Status: DC

## 2021-09-02 MED ORDER — ATORVASTATIN CALCIUM 20 MG PO TABS
20.0000 mg | ORAL_TABLET | Freq: Every day | ORAL | Status: DC
  Administered 2021-09-03 – 2021-09-10 (×8): 20 mg via ORAL
  Filled 2021-09-02: qty 2
  Filled 2021-09-02: qty 1
  Filled 2021-09-02: qty 2
  Filled 2021-09-02 (×4): qty 1
  Filled 2021-09-02: qty 2

## 2021-09-02 MED ORDER — PANTOPRAZOLE SODIUM 40 MG IV SOLR
40.0000 mg | Freq: Once | INTRAVENOUS | Status: AC
  Administered 2021-09-02: 40 mg via INTRAVENOUS
  Filled 2021-09-02: qty 10

## 2021-09-02 MED ORDER — ACETAMINOPHEN 325 MG PO TABS
650.0000 mg | ORAL_TABLET | Freq: Four times a day (QID) | ORAL | Status: DC | PRN
  Administered 2021-09-03 – 2021-09-04 (×2): 650 mg via ORAL
  Filled 2021-09-02 (×2): qty 2

## 2021-09-02 MED ORDER — ALBUTEROL SULFATE (2.5 MG/3ML) 0.083% IN NEBU
INHALATION_SOLUTION | Freq: Four times a day (QID) | RESPIRATORY_TRACT | Status: DC | PRN
  Administered 2021-09-06: 2.5 mg via RESPIRATORY_TRACT
  Filled 2021-09-02: qty 3

## 2021-09-02 MED ORDER — APIXABAN 5 MG PO TABS
5.0000 mg | ORAL_TABLET | Freq: Two times a day (BID) | ORAL | Status: DC
  Administered 2021-09-02 – 2021-09-10 (×16): 5 mg via ORAL
  Filled 2021-09-02 (×16): qty 1

## 2021-09-02 MED ORDER — LORAZEPAM 2 MG/ML IJ SOLN
0.0000 mg | Freq: Three times a day (TID) | INTRAMUSCULAR | Status: AC
  Administered 2021-09-04 – 2021-09-05 (×3): 2 mg via INTRAVENOUS
  Filled 2021-09-02: qty 2
  Filled 2021-09-02 (×4): qty 1

## 2021-09-02 MED ORDER — ALBUTEROL SULFATE (2.5 MG/3ML) 0.083% IN NEBU: 2.5000 mg | INHALATION_SOLUTION | RESPIRATORY_TRACT | Status: DC | PRN

## 2021-09-02 MED ORDER — SODIUM CHLORIDE 0.9 % IV SOLN: INTRAVENOUS | Status: DC

## 2021-09-02 MED ORDER — FOLIC ACID 1 MG PO TABS
1.0000 mg | ORAL_TABLET | Freq: Every day | ORAL | Status: DC
  Administered 2021-09-03 – 2021-09-10 (×8): 1 mg via ORAL
  Filled 2021-09-02 (×8): qty 1

## 2021-09-02 MED ORDER — OXYCODONE HCL 5 MG PO TABS
5.0000 mg | ORAL_TABLET | ORAL | Status: DC | PRN
  Administered 2021-09-02 – 2021-09-10 (×17): 5 mg via ORAL
  Filled 2021-09-02 (×17): qty 1

## 2021-09-02 MED ORDER — HYDRALAZINE HCL 20 MG/ML IJ SOLN
5.0000 mg | INTRAMUSCULAR | Status: DC | PRN
  Administered 2021-09-02: 5 mg via INTRAVENOUS
  Filled 2021-09-02: qty 1

## 2021-09-02 MED ORDER — BISACODYL 5 MG PO TBEC: 5.0000 mg | DELAYED_RELEASE_TABLET | Freq: Every day | ORAL | Status: DC | PRN

## 2021-09-02 MED ORDER — LORAZEPAM 2 MG/ML IJ SOLN
0.0000 mg | INTRAMUSCULAR | Status: AC
  Administered 2021-09-02: 2 mg via INTRAVENOUS
  Administered 2021-09-02 – 2021-09-03 (×2): 1 mg via INTRAVENOUS
  Administered 2021-09-04: 2 mg via INTRAVENOUS
  Filled 2021-09-02 (×3): qty 1

## 2021-09-02 MED ORDER — ENOXAPARIN SODIUM 30 MG/0.3ML IJ SOSY
30.0000 mg | PREFILLED_SYRINGE | INTRAMUSCULAR | Status: DC
  Administered 2021-09-02: 30 mg via SUBCUTANEOUS
  Filled 2021-09-02: qty 0.3

## 2021-09-02 MED ORDER — METOPROLOL SUCCINATE ER 25 MG PO TB24
25.0000 mg | ORAL_TABLET | Freq: Every day | ORAL | Status: DC
  Administered 2021-09-03 – 2021-09-06 (×3): 25 mg via ORAL
  Filled 2021-09-02 (×4): qty 1

## 2021-09-02 MED ORDER — SODIUM CHLORIDE 0.9 % IV BOLUS
1000.0000 mL | Freq: Once | INTRAVENOUS | Status: AC
  Administered 2021-09-02: 1000 mL via INTRAVENOUS

## 2021-09-02 MED ORDER — ACETAMINOPHEN 650 MG RE SUPP: 650.0000 mg | Freq: Four times a day (QID) | RECTAL | Status: DC | PRN

## 2021-09-02 MED ORDER — POLYETHYLENE GLYCOL 3350 17 G PO PACK
17.0000 g | PACK | Freq: Every day | ORAL | Status: DC | PRN
  Administered 2021-09-03 – 2021-09-04 (×2): 17 g via ORAL
  Filled 2021-09-02 (×2): qty 1

## 2021-09-02 MED ORDER — POLYETHYL GLYCOL-PROPYL GLYCOL 0.4-0.3 % OP GEL
Freq: Two times a day (BID) | OPHTHALMIC | Status: DC | PRN
  Filled 2021-09-02: qty 10

## 2021-09-02 MED ORDER — THIAMINE HCL 100 MG PO TABS
100.0000 mg | ORAL_TABLET | Freq: Every day | ORAL | Status: DC
  Administered 2021-09-02: 100 mg via ORAL
  Filled 2021-09-02: qty 1

## 2021-09-02 MED ORDER — ADULT MULTIVITAMIN W/MINERALS CH
1.0000 | ORAL_TABLET | Freq: Every day | ORAL | Status: DC
  Administered 2021-09-02: 1 via ORAL
  Filled 2021-09-02: qty 1

## 2021-09-02 MED ORDER — DOCUSATE SODIUM 100 MG PO CAPS
100.0000 mg | ORAL_CAPSULE | Freq: Two times a day (BID) | ORAL | Status: DC
  Administered 2021-09-02 – 2021-09-10 (×16): 100 mg via ORAL
  Filled 2021-09-02 (×17): qty 1

## 2021-09-02 MED ORDER — LORAZEPAM 2 MG/ML IJ SOLN
1.0000 mg | INTRAMUSCULAR | Status: DC | PRN
  Administered 2021-09-02 (×3): 2 mg via INTRAVENOUS
  Filled 2021-09-02 (×3): qty 1

## 2021-09-02 MED ORDER — LORAZEPAM 2 MG/ML IJ SOLN
1.0000 mg | INTRAMUSCULAR | Status: AC | PRN
  Administered 2021-09-04: 1 mg via INTRAVENOUS
  Administered 2021-09-05: 4 mg via INTRAVENOUS
  Filled 2021-09-02: qty 2
  Filled 2021-09-02: qty 1

## 2021-09-02 NOTE — H&P (Signed)
History and Physical    Patient: [name] IRJ:[id number] DOB: 10/13/1960 DOA: 09/02/2021 DOS: the patient was seen and examined on 09/02/2021 PCP: Biagio Borg, MD  Patient coming from: Home - lives with husband; NOK: Husband, [phone]   Chief Complaint: ETOH withdrawal  HPI: [name] is a 61 y.o. female with medical history significant of ETOH dependence; afib; HTN; HLD; and morbid obesity (BMI 56) presenting with ETOH withdrawal.  She reports that her husband is out of town.  She had a "little party" for Father's Day and it got out of hand.  She has been drinking 2 fifths a day.  She has been making "cosmetologists and jello shots."  She decided to stop cold [place] yesterday.  She binge drinks and then just stops periodically.  She has been having visual hallucinations (a rat running across the porch, a bird, spots).  She got very weak like she might pass out.  +shaky/nervous.  She denies h/o seizure.  She was hospitalized for ETOH withdrawal for a couple of days the last time.  Currently, she is still shaky.  She is still seeing things.    ER Course:  ETOH withdrawal.  Has had 6 mg Ativan, still with visual hallucinations.     Review of Systems: As mentioned in the history of present illness. All other systems reviewed and are negative.  Past Medical History:  Diagnosis Date   Abscess of bladder 07/28/2016   Alcohol dependence ([place])    Allergic rhinitis 12/06/2013   Asthma 03/16/2015   Atrial flutter with rapid ventricular response ([place]) 04/01/2020   COLONIC POLYPS, HX OF 04/05/2010   DIVERTICULITIS, HX OF 04/05/2010   DJD (degenerative joint disease)    right knee, mot to severe   GERD (gastroesophageal reflux disease)    no meds   Heart murmur    hx of    Hyperlipidemia    Hypertension    Impaired glucose tolerance 12/06/2013   Morbid obesity with BMI of 50.0-59.9, adult [facility])    Past Surgical History:  Procedure Laterality Date   ABDOMINAL  HYSTERECTOMY  age 65   fibroids   COLONOSCOPY WITH PROPOFOL N/A 07/25/2016   Procedure: COLONOSCOPY WITH PROPOFOL;  Surgeon: Carol Ada, MD;  Location: [facility];  Service: Endoscopy;  Laterality: N/A;   colonscopy     x 2   IR RADIOLOGIST EVAL & MGMT  08/12/2016   IR RADIOLOGIST EVAL & MGMT  08/21/2016   KNEE ARTHROSCOPY     left    TOTAL KNEE ARTHROPLASTY  07/29/2011   Procedure: TOTAL KNEE ARTHROPLASTY;  Surgeon: Mauri Pole, MD;  Location: [facility]S;  Service: Orthopedics;  Laterality: Right;   TOTAL KNEE ARTHROPLASTY Left 12/14/2012   Procedure: LEFT TOTAL KNEE ARTHROPLASTY;  Surgeon: Mauri Pole, MD;  Location: [facility];  Service: Orthopedics;  Laterality: Left;   Social History:  reports that she quit smoking about 33 years ago. Her smoking use included cigarettes. She has a 3.50 pack-year smoking history. She has never used smokeless tobacco. She reports current alcohol use. She reports that she does not currently use drugs after having used the following drugs: Marijuana.  Allergies  Allergen Reactions   Morphine Itching   Shrimp [Shellfish Allergy] Itching and Other (See Comments)    Tongue burns also   Tramadol Other (See Comments)    Caused confusion   Covid-19 (Mrna) Vaccine Hives   Diltiazem Hcl Itching    Pt with itching of the feet  when bolus given   Other Other (See Comments)   Latex Rash    Family History  Problem Relation Age of Onset   Stroke Mother    COPD Father    Lymphoma Sister    Alcoholism Sister     Prior to Admission medications   Medication Sig Start Date End Date Taking? Authorizing Provider  acetaminophen (TYLENOL) 500 MG tablet Take 250 mg by mouth every 6 (six) hours as needed for headache or moderate pain.    [provider]  albuterol (VENTOLIN HFA) 108 (90 Base) MCG/ACT inhaler 2 puffs every 6 hrs as needed Patient taking differently: Inhale 2 puffs into the lungs every 6 (six) hours as needed for wheezing or shortness of  breath. 03/13/21   Biagio Borg, MD  alum & mag hydroxide-simeth (MAALOX/MYLANTA) 200-200-20 MG/5ML suspension Take 30 mLs by mouth every 6 (six) hours as needed for indigestion or heartburn.    [provider]  apixaban (ELIQUIS) 5 MG TABS tablet Take 1 tablet (5 mg total) by mouth 2 (two) times daily. 04/17/21   Sherran Needs, NP  atorvastatin (LIPITOR) 20 MG tablet Take 1 tablet (20 mg total) by mouth daily. 07/20/20 07/20/21  Biagio Borg, MD  chlordiazePOXIDE (LIBRIUM) 25 MG capsule '50mg'$  PO TID x 1D, then 25-'50mg'$  PO BID X 1D, then 25-'50mg'$  PO QD X 1D Patient not taking: Reported on 04/29/2021 04/09/21   Biagio Borg, MD  HYDROcodone-acetaminophen (NORCO/VICODIN) 5-325 MG tablet Take 1 tablet by mouth every 4 (four) hours as needed for moderate pain. 06/14/21 06/14/22  Fransico Meadow, PA-C  losartan-hydrochlorothiazide (HYZAAR) 100-25 MG tablet TAKE 1 TABLET BY MOUTH EVERY DAY 08/05/21   Biagio Borg, MD  metoprolol succinate (TOPROL-XL) 25 MG 24 hr tablet Take 1 tablet (25 mg total) by mouth daily. 04/17/21   Sherran Needs, NP  ondansetron (ZOFRAN-ODT) 4 MG disintegrating tablet Take 1 tablet (4 mg total) by mouth every 8 (eight) hours as needed for nausea or vomiting. 06/14/21   Fransico Meadow, PA-C  pantoprazole (PROTONIX) 40 MG tablet Take 1 tablet (40 mg total) by mouth daily. 07/31/21   Biagio Borg, MD  Polyethyl Glycol-Propyl Glycol (SYSTANE OP) Place 1 drop into both eyes 2 (two) times daily as needed (dry eyes).    [provider]  potassium chloride (KLOR-CON M) 10 MEQ tablet Take 1 tablet (10 mEq total) by mouth as needed. Patient taking differently: Take 10 mEq by mouth as needed (low potassium). 04/17/21   Sherran Needs, NP  predniSONE (DELTASONE) 50 MG tablet Take 1 tablet (50 mg total) by mouth daily. 04/08/31   Delora Fuel, MD  triamcinolone cream (KENALOG) 0.1 % APPLY TO AFFECTED AREA TWICE A DAY 06/21/21   Biagio Borg, MD    Physical Exam: Vitals:   09/02/21  1500 09/02/21 1521 09/02/21 1612 09/02/21 1803  BP: (!) 146/90 (!) 146/90  140/86  Pulse: (!) 118 (!) 118  (!) 110  Resp: 17   20  Temp:   98.2 F (36.8 C)   TempSrc:   Oral   SpO2: 95%   95%  Weight:      Height:       General:  Appears mildly anxious but calm, describing visual hallucinations without emotion Eyes:  EOMI, normal lids, iris ENT:  grossly normal hearing, lips & tongue, mmm Neck:  no LAD, masses or thyromegaly Cardiovascular:  RR with mild tachycardia, no m/r/g. No LE edema.  Respiratory:   CTA bilaterally with no wheezes/rales/rhonchi.  Normal to mildly increased respiratory effort. Abdomen:  soft, mildly diffusely TTP, ND Skin:  no rash or induration seen on limited exam Musculoskeletal:  grossly normal tone BUE/BLE, good ROM, no bony abnormality Psychiatric:  mildly anxious mood and affect, speech fluent and appropriate, AOx3 Neurologic:  CN 2-12 grossly intact, moves all extremities in coordinated fashion   Radiological Exams on Admission: Independently reviewed - see discussion in A/P where applicable  No results found.  EKG: Independently reviewed.  Sinus tachycardia with rate 103; nonspecific ST changes with no evidence of acute ischemia   Labs on Admission: I have personally reviewed the available labs and imaging studies at the time of the admission.  Pertinent labs:    BUN 30/Creatinine 3.58/GFR 14; 20/1.76/33 on 5/4; normal in 03/2021 AP 129 Albumin 3.0 AST 67/ALT 58 WBC 13.5 ETOH <10 UDS + opiates   Assessment and Plan: Principal Problem:   Alcohol dependence with withdrawal delirium ([place]) Active Problems:   Essential hypertension   Morbid obesity ([facility])   Asthma   Hyperlipidemia   History of pulmonary embolism   PAF (paroxysmal atrial fibrillation) ([facility])    ETOH dependence with withdrawal delirium, visual hallucinations -Patient with chronic ETOH dependence -Number of drinks per day: up to 1/2 gallon -Number of admissions for  management of alcohol withdrawal syndrome (detox): at least 2 plus ER visits -History of withdrawal seizures, ICU admissions, DTs: DTs x 1 -Patient is exhibiting active s/sx of withdrawal -BAL on admission <10 -He is at high risk for complications of withdrawal including seizures, DTs; she is at high risk for needing Precedex to help get through DTs -Will admit to progressive care at this time -CIWA protocol -Folate, thiamine, and MVI ordered -Will provide fixed-dose (Valium 5 mg PO q6h) and also symptom-triggered BZD (Ativan per CIWA protocol) given his high BAL level; h/o severe withdrawal; and/or current signs of severe withdrawal. -TOC team consult for substance abuse education -Elevated LFTs are likely related to alcoholism -Consider offering a medication for Alcohol Use Disorder at the time of d/c, to include Disulfuram; Naltrexone; or Acamprosate.  AKI -She appears to have underlying stage 3a CKD, but current renal function is significantly worse than prior  -Will hydrate and follow -Avoid nephrotoxic agents where possible  History of pulmonary embolism -Appears to have had first lifetime VTE event in 5/20 -Continue Eliquis   Essential hypertension -Continue Toprol XL -Hold Hyzaar in the setting of AKI   Asthma -Continue prn Albuterol HFA   HLD -Continue Lipitor  Afib -Rate controlled with Toprol XL -Continue Eliquis   Morbid obesity -Body mass index is 56.24 kg/m.  -Weight loss should be encouraged -Outpatient PCP/bariatric medicine/bariatric surgery f/u encouraged    Advance Care Planning:   Code Status: Full Code   Consults: TOC team; nutrition; PT/OT  DVT Prophylaxis: Eliquis  Family Communication: None present; she declined to have me contact family at the time of admission  Severity of Illness: The appropriate patient status for this patient is INPATIENT. Inpatient status is judged to be reasonable and necessary in order to provide the required  intensity of service to ensure the patient's safety. The patient's presenting symptoms, physical exam findings, and initial radiographic and laboratory data in the context of their chronic comorbidities is felt to place them at high risk for further clinical deterioration. Furthermore, it is not anticipated that the patient will be medically stable for discharge from the hospital within 2 midnights of admission.   *  I certify that at the point of admission it is my clinical judgment that the patient will require inpatient hospital care spanning beyond 2 midnights from the point of admission due to high intensity of service, high risk for further deterioration and high frequency of surveillance required.*  Author: Karmen Bongo, MD 09/02/2021 6:16 PM  For on call review [email or web address].

## 2021-09-02 NOTE — ED Provider Notes (Signed)
[facility] EMERGENCY DEPARTMENT Provider Note   CSN: [id number] Arrival date & time: 09/02/21  0732     History  Chief Complaint  Patient presents with   Alcohol Problem   Tremors   Hallucinations    LIZETH BENCOSME is a 61 y.o. female. With past medical history of hypertension, alcohol use disorder, atrial fibrillation who presents to the emergency department with alcohol withdrawal.   States she is withdrawing from alcohol.  She states that she is drinking 2 1/5 of vodka daily over the past 3 to 4 weeks.  She states that she had a "family stressor" that came up around that time and since has been essentially binge drinking.  She states her last drink was yesterday evening.  She is having tremors, nausea with nonbloody vomiting, headache, blurry vision and feels as if she is seeing things crawl on the floor.  She has been through alcohol withdrawal previously requiring admission for DTs but denies any previous alcohol withdrawal seizures.  She denies other drug use.   Alcohol Problem Associated symptoms include abdominal pain.       Home Medications Prior to Admission medications   Medication Sig Start Date End Date Taking? Authorizing Provider  acetaminophen (TYLENOL) 500 MG tablet Take 250 mg by mouth every 6 (six) hours as needed for headache or moderate pain.    [provider]  albuterol (VENTOLIN HFA) 108 (90 Base) MCG/ACT inhaler 2 puffs every 6 hrs as needed Patient taking differently: Inhale 2 puffs into the lungs every 6 (six) hours as needed for wheezing or shortness of breath. 03/13/21   Biagio Borg, MD  alum & mag hydroxide-simeth (MAALOX/MYLANTA) 200-200-20 MG/5ML suspension Take 30 mLs by mouth every 6 (six) hours as needed for indigestion or heartburn.    [provider]  apixaban (ELIQUIS) 5 MG TABS tablet Take 1 tablet (5 mg total) by mouth 2 (two) times daily. 04/17/21   Sherran Needs, NP  atorvastatin (LIPITOR) 20 MG  tablet Take 1 tablet (20 mg total) by mouth daily. 07/20/20 07/20/21  Biagio Borg, MD  chlordiazePOXIDE (LIBRIUM) 25 MG capsule '50mg'$  PO TID x 1D, then 25-'50mg'$  PO BID X 1D, then 25-'50mg'$  PO QD X 1D Patient not taking: Reported on 04/29/2021 04/09/21   Biagio Borg, MD  HYDROcodone-acetaminophen (NORCO/VICODIN) 5-325 MG tablet Take 1 tablet by mouth every 4 (four) hours as needed for moderate pain. 06/14/21 06/14/22  Fransico Meadow, PA-C  losartan-hydrochlorothiazide (HYZAAR) 100-25 MG tablet TAKE 1 TABLET BY MOUTH EVERY DAY 08/05/21   Biagio Borg, MD  metoprolol succinate (TOPROL-XL) 25 MG 24 hr tablet Take 1 tablet (25 mg total) by mouth daily. 04/17/21   Sherran Needs, NP  ondansetron (ZOFRAN-ODT) 4 MG disintegrating tablet Take 1 tablet (4 mg total) by mouth every 8 (eight) hours as needed for nausea or vomiting. 06/14/21   Fransico Meadow, PA-C  pantoprazole (PROTONIX) 40 MG tablet Take 1 tablet (40 mg total) by mouth daily. 07/31/21   Biagio Borg, MD  Polyethyl Glycol-Propyl Glycol (SYSTANE OP) Place 1 drop into both eyes 2 (two) times daily as needed (dry eyes).    [provider]  potassium chloride (KLOR-CON M) 10 MEQ tablet Take 1 tablet (10 mEq total) by mouth as needed. Patient taking differently: Take 10 mEq by mouth as needed (low potassium). 04/17/21   Sherran Needs, NP  predniSONE (DELTASONE) 50 MG tablet Take 1 tablet (50 mg total) by  mouth daily. 2/83/66   Delora Fuel, MD  triamcinolone cream (KENALOG) 0.1 % APPLY TO AFFECTED AREA TWICE A DAY 06/21/21   Biagio Borg, MD      Allergies    Morphine, Shrimp [shellfish allergy], Tramadol, Covid-19 (mrna) vaccine, Diltiazem hcl, Other, and Latex    Review of Systems   Review of Systems  Gastrointestinal:  Positive for abdominal pain, nausea and vomiting.  Neurological:  Positive for tremors.  Psychiatric/Behavioral:  Positive for hallucinations.   All other systems reviewed and are negative.   Physical Exam Updated Vital  Signs BP (!) 153/105   Pulse (!) 103   Temp 98.2 F (36.8 C)   Resp 20   Ht '5\' 1"'$  (1.549 m)   Wt 135 kg   SpO2 97%   BMI 56.24 kg/m  Physical Exam Vitals and nursing note reviewed.  Constitutional:      Appearance: She is obese. She is ill-appearing.  HENT:     Head: Normocephalic.     Mouth/Throat:     Mouth: Mucous membranes are dry.     Pharynx: Oropharynx is clear.  Eyes:     General: No scleral icterus.    Extraocular Movements: Extraocular movements intact.     Pupils: Pupils are equal, round, and reactive to light.  Cardiovascular:     Rate and Rhythm: Regular rhythm. Tachycardia present.     Pulses: Normal pulses.  Pulmonary:     Effort: Pulmonary effort is normal.     Breath sounds: Normal breath sounds.  Abdominal:     General: Bowel sounds are normal.     Palpations: Abdomen is soft.     Tenderness: There is no abdominal tenderness.  Musculoskeletal:        General: Normal range of motion.     Cervical back: Neck supple.  Skin:    General: Skin is warm and dry.     Capillary Refill: Capillary refill takes less than 2 seconds.     Coloration: Skin is not jaundiced.  Neurological:     General: No focal deficit present.     Mental Status: She is alert.     Cranial Nerves: Cranial nerves 2-12 are intact.     Sensory: Sensation is intact.     Motor: Tremor present.     Comments: visible coarse tremor and tongue fasciculations  Psychiatric:        Attention and Perception: She perceives visual hallucinations. She does not perceive auditory hallucinations.        Speech: Speech normal.        Behavior: Behavior is cooperative.        Thought Content: Thought content does not include homicidal or suicidal ideation.        Cognition and Memory: Cognition and memory normal.        Judgment: Judgment normal.    ED Results / Procedures / Treatments   Labs (all labs ordered are listed, but only abnormal results are displayed) Labs Reviewed  COMPREHENSIVE  METABOLIC PANEL - Abnormal; Notable for the following components:      Result Value   Glucose, Bld 110 (*)    BUN 30 (*)    Creatinine, Ser 3.58 (*)    Albumin 3.0 (*)    AST 67 (*)    ALT 58 (*)    Alkaline Phosphatase 129 (*)    GFR, Estimated 14 (*)    All other components within normal limits  CBC - Abnormal; Notable for the  following components:   WBC 13.5 (*)    All other components within normal limits  RAPID URINE DRUG SCREEN, HOSP PERFORMED - Abnormal; Notable for the following components:   Opiates POSITIVE (*)    All other components within normal limits  ETHANOL  LIPASE, BLOOD  I-STAT BETA HCG BLOOD, ED ([facility], [facility], [facility] ONLY)   EKG None  Radiology No results found.  Procedures Procedures   Medications Ordered in ED Medications  LORazepam (ATIVAN) tablet 1-4 mg ( Oral See Alternative 09/02/21 1042)    Or  LORazepam (ATIVAN) injection 1-4 mg (2 mg Intravenous Given 09/02/21 1042)  thiamine tablet 100 mg (100 mg Oral Given 09/02/21 0928)    Or  thiamine (B-1) injection 100 mg ( Intravenous See Alternative 9/56/21 3086)  folic acid (FOLVITE) tablet 1 mg (1 mg Oral Given 09/02/21 5784)  multivitamin with minerals tablet 1 tablet (1 tablet Oral Given 09/02/21 0928)  0.9 %  sodium chloride infusion (has no administration in time range)  alum & mag hydroxide-simeth (MAALOX/MYLANTA) 200-200-20 MG/5ML suspension 30 mL (has no administration in time range)  pantoprazole (PROTONIX) injection 40 mg (has no administration in time range)  ondansetron (ZOFRAN) injection 4 mg (4 mg Intravenous Given 09/02/21 0925)  sodium chloride 0.9 % bolus 1,000 mL (0 mLs Intravenous Stopped 09/02/21 1222)    ED Course/ Medical Decision Making/ A&P                           Medical Decision Making Amount and/or Complexity of Data Reviewed Labs: ordered.  Risk OTC drugs. Prescription drug management. Decision regarding hospitalization.  This patient presents to the ED for concern of  alcohol withdrawal, this involves an extensive number of treatment options, and is a complaint that carries with it a high risk of complications and morbidity.  The differential diagnosis includes DTs, seizure, alcohol withdrawal, polysubstance use, electrolyte derangement  Co morbidities that complicate the patient evaluation Alcohol use disorder, hypertension, atrial fibrillation  Additional history obtained:  Additional history obtained from: Patient External records from outside source obtained and reviewed including: Alcohol withdrawal admission H&P physician note  EKG: EKG: sinus tachycardia.   Cardiac Monitoring: The patient was maintained on a cardiac monitor.  I personally viewed and interpreted the cardiac monitored which showed an underlying rhythm of: Sinus tachycardia  Lab Results: I personally ordered, reviewed, and interpreted labs. Pertinent results include: CMP with creatinine of 3.58, appears to be AKI, transaminitis consistent with alcohol use CBC with leukocytosis 13.5, likely reactive Alcohol negative UDS positive for opiates Lipase 24, negative  Medications  I ordered medication including Ativan, thiamine, multivitamin, folic acid, Zofran for alcohol withdrawal Reevaluation of the patient after medication shows that patient improved -I reviewed the patient's home medications and did not make adjustments. -I did not prescribe new home medications.  Tests Considered: N/A  Critical Interventions: Ativan  Consultations: I requested consultation with the hospitalist, Dr. Lorin Mercy,  and discussed lab and imaging findings as well as pertinent plan - they recommend: Admission  [facility] Substance use disorder  ED Course:  61 year old female who presents emergency department with alcohol withdrawal She has a history of DVTs but no seizures. Initial CIWA score 15  She is grossly tremoring on my exam and has tongue fasciculations.  She does appear anxious.  Does not  appear intoxicated at this time.  She is not jaundiced.  She is having visual hallucinations. Labs as noted above  Initiated CIWA  protocol.  She was given Zofran for nausea as well as some IV fluids for likely dehydration and she has an AKI.  Given her history of DTs as well as severity of her alcohol withdrawal at this time and high CIWA score, feel it appropriate for her to be admitted rather than outpatient Librium or carbamazepine taper.  She has already required 6 mg of IV Ativan in her first few hours with ongoing hallucinations.  Spoke with Dr. Lorin Mercy, hospitalist who agrees to admit the patient.  Exam is inconsistent with opiate withdrawal.  She has opiates on her UDS.  It appears she has a prescription for Norco.  Denies abuse of this medication. After consideration of the diagnostic results and the patients response to treatment, I feel that the patent would benefit from admission. The patient has been appropriately medically screened and/or stabilized in the ED. I have low suspicion for any other emergent medical condition which would require further screening, evaluation or treatment in the ED or require inpatient management.  Final Clinical Impression(s) / ED Diagnoses Final diagnoses:  Alcohol withdrawal syndrome with perceptual disturbance [facility])    Rx / DC Orders ED Discharge Orders     None         Mickie Hillier, PA-C 09/02/21 1225    Davonna Belling, MD 09/02/21 1511

## 2021-09-02 NOTE — ED Triage Notes (Signed)
Pt here from home w/ etoh withdrawal. Patient was binging yesterday and estimates she had about a half a gallon of vodka. Denies any additional drug use. Pt presents with arm tremors, reports visual hallucinations - something crawling on the floor. Pt reports lower generalized abdominal pain, w/ n/v. Pt is Aox4.

## 2021-09-02 NOTE — Evaluation (Signed)
Physical Therapy Evaluation Patient Details Name: [name] MRN: [id number] DOB: March 19, 1960 Today's Date: 09/02/2021  History of Present Illness  Pt is a 62 y/o female admitted secondary to alcohol withdrawal and hallucinations. PMH includes hypertension, alcohol use disorder, atrial fibrillation.  Clinical Impression  Pt admitted secondary to problem above with deficits below. Pt requiring min A to stand and ambulate short distance in room. Pt limited secondary to fatigue and dizziness. Increased tremors noted as well. Anticipate pt will progress well as symptoms improve. Will continue to follow acutely to maximize functional mobility independence and safety.        Recommendations for follow up therapy are one component of a multi-disciplinary discharge planning process, led by the attending physician.  Recommendations may be updated based on patient status, additional functional criteria and insurance authorization.  Follow Up Recommendations Home health PT (pending progression)      Assistance Recommended at Discharge Frequent or constant Supervision/Assistance (initially)  Patient can return home with the following  A little help with walking and/or transfers;A little help with bathing/dressing/bathroom;Assistance with cooking/housework;Help with stairs or ramp for entrance;Assist for transportation    Equipment Recommendations Other (comment) (rollator and tub bench)  Recommendations for Other Services       Functional Status Assessment Patient has had a recent decline in their functional status and demonstrates the ability to make significant improvements in function in a reasonable and predictable amount of time.     Precautions / Restrictions Precautions Precautions: Fall Restrictions Weight Bearing Restrictions: No      Mobility  Bed Mobility               General bed mobility comments: In chair upon entry    Transfers Overall transfer level: Needs  assistance Equipment used: Rolling walker (2 wheels) Transfers: Sit to/from Stand Sit to Stand: Min assist           General transfer comment: Min A for lift assist and steadying. Increased tremors in standing.    Ambulation/Gait Ambulation/Gait assistance: Min assist Gait Distance (Feet): 5 Feet Assistive device: Rolling walker (2 wheels) Gait Pattern/deviations: Step-through pattern, Decreased stride length Gait velocity: Decreased     General Gait Details: Increased tremors noted. Min A for steadying. Pt reporting mild dizziness  Stairs            Wheelchair Mobility    Modified Rankin (Stroke Patients Only)       Balance Overall balance assessment: Needs assistance Sitting-balance support: No upper extremity supported, Feet supported Sitting balance-Leahy Scale: Fair     Standing balance support: Bilateral upper extremity supported Standing balance-Leahy Scale: Poor Standing balance comment: Reliant on BUE support                             Pertinent Vitals/Pain Pain Assessment Pain Assessment: Faces Faces Pain Scale: Hurts even more Pain Location: stomach Pain Descriptors / Indicators: Aching Pain Intervention(s): Limited activity within patient's tolerance, Monitored during session, Repositioned    Home Living Family/patient expects to be discharged to:: Private residence Living Arrangements: Spouse/significant other Available Help at Discharge: Family;Available PRN/intermittently Type of Home: House Home Access: Stairs to enter Entrance Stairs-Rails: Left Entrance Stairs-Number of Steps: 5   Home Layout: One level Home Equipment: Standard Walker      Prior Function Prior Level of Function : Independent/Modified Independent  Hand Dominance        Extremity/Trunk Assessment   Upper Extremity Assessment Upper Extremity Assessment: Defer to OT evaluation    Lower Extremity Assessment Lower  Extremity Assessment: Generalized weakness (increased tremors)    Cervical / Trunk Assessment Cervical / Trunk Assessment: Other exceptions Cervical / Trunk Exceptions: increased body habitus  Communication   Communication: No difficulties  Cognition Arousal/Alertness: Awake/alert Behavior During Therapy: WFL for tasks assessed/performed Overall Cognitive Status: No family/caregiver present to determine baseline cognitive functioning                                 General Comments: Reports seeing bugs crawling that are not there. Slow processing and required some redirection.        General Comments      Exercises     Assessment/Plan    PT Assessment Patient needs continued PT services  PT Problem List Decreased strength;Decreased activity tolerance;Decreased balance;Decreased mobility;Decreased safety awareness;Decreased cognition;Decreased knowledge of use of DME;Decreased knowledge of precautions       PT Treatment Interventions DME instruction;Gait training;Stair training;Functional mobility training;Therapeutic exercise;Therapeutic activities;Balance training;Patient/family education    PT Goals (Current goals can be found in the Care Plan section)  Acute Rehab PT Goals Patient Stated Goal: to go home PT Goal Formulation: With patient Time For Goal Achievement: 09/16/21 Potential to Achieve Goals: Good    Frequency Min 3X/week     Co-evaluation               AM-PAC PT "6 Clicks" Mobility  Outcome Measure Help needed turning from your back to your side while in a flat bed without using bedrails?: A Little Help needed moving from lying on your back to sitting on the side of a flat bed without using bedrails?: A Little Help needed moving to and from a bed to a chair (including a wheelchair)?: A Little Help needed standing up from a chair using your arms (e.g., wheelchair or bedside chair)?: A Little Help needed to walk in hospital room?: A  Little Help needed climbing 3-5 steps with a railing? : A Lot 6 Click Score: 17    End of Session   Activity Tolerance: Patient limited by fatigue Patient left: in chair;with call bell/phone within reach (in chair in ED) Nurse Communication: Mobility status PT Visit Diagnosis: Unsteadiness on feet (R26.81);Muscle weakness (generalized) (M62.81);Difficulty in walking, not elsewhere classified (R26.2)    Time: 5409-8119 PT Time Calculation (min) (ACUTE ONLY): 15 min   Charges:   PT Evaluation $PT Eval Moderate Complexity: 1 Mod          Reuel Derby, PT, DPT  Acute Rehabilitation Services  Office: [phone]   Rudean Hitt 09/02/2021, 4:29 PM

## 2021-09-02 NOTE — Plan of Care (Signed)

## 2021-09-03 DIAGNOSIS — F10231 Alcohol dependence with withdrawal delirium: Secondary | ICD-10-CM | POA: Diagnosis not present

## 2021-09-03 LAB — HIV ANTIBODY (ROUTINE TESTING W REFLEX): HIV Screen 4th Generation wRfx: NONREACTIVE

## 2021-09-03 LAB — BASIC METABOLIC PANEL
Anion gap: 11 (ref 5–15)
BUN: 37 mg/dL — ABNORMAL HIGH (ref 6–20)
CO2: 24 mmol/L (ref 22–32)
Calcium: 8.4 mg/dL — ABNORMAL LOW (ref 8.9–10.3)
Chloride: 102 mmol/L (ref 98–111)
Creatinine, Ser: 5.09 mg/dL — ABNORMAL HIGH (ref 0.44–1.00)
GFR, Estimated: 9 mL/min — ABNORMAL LOW (ref >60)
Glucose, Bld: 108 mg/dL — ABNORMAL HIGH (ref 70–99)
Potassium: 4.5 mmol/L (ref 3.5–5.1)
Sodium: 137 mmol/L (ref 135–145)

## 2021-09-03 LAB — CBC
HCT: 34.1 % — ABNORMAL LOW (ref 36.0–46.0)
Hemoglobin: 11.4 g/dL — ABNORMAL LOW (ref 12.0–15.0)
MCH: 30.5 pg (ref 26.0–34.0)
MCHC: 33.4 g/dL (ref 30.0–36.0)
MCV: 91.2 fL (ref 80.0–100.0)
Platelets: 231 10*3/uL (ref 150–400)
RBC: 3.74 MIL/uL — ABNORMAL LOW (ref 3.87–5.11)
RDW: 14.8 % (ref 11.5–15.5)
WBC: 11.5 10*3/uL — ABNORMAL HIGH (ref 4.0–10.5)
nRBC: 0.2 % (ref 0.0–0.2)

## 2021-09-03 MED ORDER — HYDRALAZINE HCL 20 MG/ML IJ SOLN: 5.0000 mg | INTRAMUSCULAR | Status: DC | PRN

## 2021-09-03 MED ORDER — PANTOPRAZOLE SODIUM 40 MG PO TBEC
40.0000 mg | DELAYED_RELEASE_TABLET | Freq: Every day | ORAL | Status: DC
  Administered 2021-09-03 – 2021-09-10 (×8): 40 mg via ORAL
  Filled 2021-09-03 (×8): qty 1

## 2021-09-03 NOTE — Progress Notes (Signed)
Initial Nutrition Assessment  DOCUMENTATION CODES:   Morbid obesity  INTERVENTION:  Encourage adequate PO intake Double protein with meals Continue MVI with minerals daily  NUTRITION DIAGNOSIS:   Increased nutrient needs related to acute illness as evidenced by estimated needs.  GOAL:   Patient will meet greater than or equal to 90% of their needs  MONITOR:   PO intake, Supplement acceptance, Labs, Weight trends  REASON FOR ASSESSMENT:   Consult Other (Comment) (nutrition goals)  ASSESSMENT:   Pt admitted with ETOH withdrawal. PMH significant for ETOH dependence, afib, HTN, HLD and morbid obesity.  Spoke with pt at bedside. She endorses having a stomach ache today and has been drinking gingerale to help with this. Endorses eating most of her breakfast this morning including a spinach omelette and potatoes. Pt lost her job in October and since then reports that she does not usually eat consistent meals as she is not on a routine schedule. She recalls eating mostly snacks throughout the day and a big meal for dinner including a protein with sides. Her beverage intake includes water and about 2 fifths of vodka daily. She reports that when she is drinking she tends to eat less than usual.   Meal completions: 7/25: 100%-breakfast  Pt endorses minimal wt loss recently. Reviewed wt history. Pt's wt noted to be +5.7 kg since 48/25, uncertain if admit wt is actual or stated.   Medications: colace, folvite, MVI, thiamine, PRN miralax (given at 1500)  Labs: BUN 37, Cr 5.09, GFR 9  NUTRITION - FOCUSED PHYSICAL EXAM:  Flowsheet Row Most Recent Value  Orbital Region No depletion  Upper Arm Region No depletion  Thoracic and Lumbar Region No depletion  Buccal Region No depletion  Temple Region No depletion  Clavicle Bone Region No depletion  Clavicle and Acromion Bone Region No depletion  Scapular Bone Region No depletion  Dorsal Hand No depletion  Patellar Region No depletion   Anterior Thigh Region No depletion  Posterior Calf Region No depletion  Edema (RD Assessment) None  Hair Reviewed  Eyes Reviewed  Mouth Reviewed  Skin Reviewed  Nails Reviewed       Diet Order:   Diet Order             Diet Heart Room service appropriate? Yes; Fluid consistency: Thin  Diet effective now                  EDUCATION NEEDS:   Education needs have been addressed  Skin:  Skin Assessment: Reviewed RN Assessment  Last BM:  7/21  Height:   Ht Readings from Last 1 Encounters:  09/02/21 '5\' 1"'$  (1.549 m)    Weight:   Wt Readings from Last 1 Encounters:  09/02/21 135 kg    Ideal Body Weight:  47.7 kg  BMI:  Body mass index is 56.24 kg/m.  Estimated Nutritional Needs:   Kcal:  1400-1600  Protein:  70-85g  Fluid:  >/=1.5L  Clayborne Dana, RDN, LDN Clinical Nutrition

## 2021-09-03 NOTE — Progress Notes (Signed)
Physical Therapy Treatment Patient Details Name: [name] MRN: [id number] DOB: 1960/03/25 Today's Date: 09/03/2021   History of Present Illness Pt is a 61 y/o female admitted secondary to alcohol withdrawal and hallucinations. PMH includes hypertension, alcohol use disorder, atrial fibrillation.    PT Comments    Pt's tremors have decreased significantly and pt now progressing toward baseline with some deconditioning.  Emphasis on safety with transitions, sit to stands and progression of gait with the RW.    Recommendations for follow up therapy are one component of a multi-disciplinary discharge planning process, led by the attending physician.  Recommendations may be updated based on patient status, additional functional criteria and insurance authorization.  Follow Up Recommendations  No PT follow up     Assistance Recommended at Discharge Intermittent Supervision/Assistance  Patient can return home with the following A little help with walking and/or transfers;A little help with bathing/dressing/bathroom;Assistance with cooking/housework;Assist for transportation   Equipment Recommendations   (TBD if needs RW)    Recommendations for Other Services       Precautions / Restrictions Precautions Precautions: Fall Restrictions Weight Bearing Restrictions: No     Mobility  Bed Mobility Overal bed mobility: Modified Independent                  Transfers Overall transfer level: Needs assistance   Transfers: Sit to/from Stand Sit to Stand: Supervision                Ambulation/Gait Ambulation/Gait assistance: Min guard Gait Distance (Feet): 200 Feet Assistive device: Rolling walker (2 wheels) Gait Pattern/deviations: Step-through pattern Gait velocity: Decreased Gait velocity interpretation: <1.8 ft/sec, indicate of risk for recurrent falls   General Gait Details: slower, but steady with the RW, mild tremors noticeable, frequent standing rests for  dyspnea 1-2/4.   Stairs             Wheelchair Mobility    Modified Rankin (Stroke Patients Only)       Balance Overall balance assessment: No apparent balance deficits (not formally assessed) Sitting-balance support: No upper extremity supported, Feet supported Sitting balance-Leahy Scale: Fair       Standing balance-Leahy Scale: Fair Standing balance comment: washing hands, hygiene at the toilet without holding to stationary surface or having any external support                            Cognition Arousal/Alertness: Awake/alert Behavior During Therapy: WFL for tasks assessed/performed Overall Cognitive Status: Within Functional Limits for tasks assessed (NT formally)                                          Exercises      General Comments        Pertinent Vitals/Pain Pain Assessment Pain Assessment: Faces Faces Pain Scale: No hurt Pain Intervention(s): Monitored during session    Home Living Family/patient expects to be discharged to:: Private residence Living Arrangements: Spouse/significant other Available Help at Discharge: Family;Available PRN/intermittently Type of Home: House Home Access: Stairs to enter Entrance Stairs-Rails: Left Entrance Stairs-Number of Steps: 5   Home Layout: One level Home Equipment: Standard Walker      Prior Function            PT Goals (current goals can now be found in the care plan section) Acute Rehab PT Goals Patient  Stated Goal: to go home PT Goal Formulation: With patient Time For Goal Achievement: 09/16/21 Potential to Achieve Goals: Good Progress towards PT goals: Progressing toward goals    Frequency    Min 3X/week      PT Plan Current plan remains appropriate;Discharge plan needs to be updated    Co-evaluation              AM-PAC PT "6 Clicks" Mobility   Outcome Measure  Help needed turning from your back to your side while in a flat bed without  using bedrails?: None Help needed moving from lying on your back to sitting on the side of a flat bed without using bedrails?: None Help needed moving to and from a bed to a chair (including a wheelchair)?: A Little Help needed standing up from a chair using your arms (e.g., wheelchair or bedside chair)?: A Little Help needed to walk in hospital room?: A Little Help needed climbing 3-5 steps with a railing? : A Little 6 Click Score: 20    End of Session   Activity Tolerance: Patient tolerated treatment well;Patient limited by fatigue Patient left: in bed;with call bell/phone within reach Nurse Communication: Mobility status PT Visit Diagnosis: Unsteadiness on feet (R26.81);Difficulty in walking, not elsewhere classified (R26.2)     Time: 2395-3202 PT Time Calculation (min) (ACUTE ONLY): 22 min  Charges:  $Gait Training: 8-22 mins                     09/03/2021  Ginger Carne., PT Acute Rehabilitation Services [phone]  (pager) [phone]  (office)   Tessie Fass Natayla Cadenhead 09/03/2021, 5:50 PM

## 2021-09-03 NOTE — Evaluation (Signed)
Occupational Therapy Evaluation Patient Details Name: [name] MRN: [id number] DOB: 02/06/1961 Today's Date: 09/03/2021   History of Present Illness Pt is a 61 y/o female admitted secondary to alcohol withdrawal and hallucinations. PMH includes hypertension, alcohol use disorder, atrial fibrillation.   Clinical Impression   Pt in bed sleeping initially although once room phone started ringing, patient awoke to answer the phone. Pt agreeable to participate in OT evaluation. Pt reports that her deficits have improved overall from her initial admittance. Pt currently demonstrating mild BUE shoulder weakness which is not impeding her ability to complete BADL tasks. Patient was distracted during evaluation trying to talk to her nurse regarding BP medication. BP cuff was switched for correct size which helped pt achieve a more accurate reading. OT provided education regarding safety awareness and proper technique during management of RW while navigating within her room and hallway. Required assist for IV pole and line management. At this time, I do not recommend any follow up OT services. No additional OT needs at this time.      Recommendations for follow up therapy are one component of a multi-disciplinary discharge planning process, led by the attending physician.  Recommendations may be updated based on patient status, additional functional criteria and insurance authorization.   Follow Up Recommendations  No OT follow up    Assistance Recommended at Discharge PRN  Patient can return home with the following Direct supervision/assist for medications management;Direct supervision/assist for financial management;Assistance with cooking/housework;A little help with walking and/or transfers    Functional Status Assessment  Patient has had a recent decline in their functional status and demonstrates the ability to make significant improvements in function in a reasonable and predictable amount  of time.  Equipment Recommendations  None recommended by OT    Recommendations for Other Services       Precautions / Restrictions Precautions Precautions: Fall Restrictions Weight Bearing Restrictions: No      Mobility Bed Mobility Overal bed mobility: Modified Independent     General bed mobility comments: utilized bed railing to transition fom sit to stand.    Transfers Overall transfer level: Needs assistance Equipment used: Rolling walker (2 wheels) Transfers: Sit to/from Stand, Bed to chair/wheelchair/BSC Sit to Stand: Supervision Stand pivot transfers: Supervision       General transfer comment: VC provided to increase awareness to right side of environment as patient frequently bumped into items such as chair, door, and wall while searching for Nurse.      Balance Overall balance assessment: No apparent balance deficits (not formally assessed)       ADL either performed or assessed with clinical judgement   ADL Overall ADL's : Needs assistance/impaired     Grooming: Wash/dry hands;Supervision/safety;Standing   Upper Body Bathing: Modified independent;Sitting   Lower Body Bathing: Supervison/ safety;Sit to/from stand;Sitting/lateral leans   Upper Body Dressing : Modified independent;Sitting   Lower Body Dressing: Supervision/safety;Sit to/from stand;Sitting/lateral leans   Toilet Transfer: Supervision/safety;Rolling walker (2 wheels) Toilet Transfer Details (indicate cue type and reason): Therapist assisting with IV pole and line management Toileting- Clothing Manipulation and Hygiene: Modified independent;Sit to/from stand               Vision Baseline Vision/History: 0 No visual deficits Patient Visual Report: Other (comment) (Pt reports spotty vision in both eyes although improved from initial admit.)              Pertinent Vitals/Pain Pain Assessment Pain Assessment: Faces (no report of pain during OT eval  although pain reported  nursing entered the room at end of session. No pain number provided.) Faces Pain Scale: Hurts a little bit Pain Location: stomach Pain Descriptors / Indicators: Aching Pain Intervention(s): Monitored during session, Patient requesting pain meds-RN notified     Hand Dominance Right   Extremity/Trunk Assessment Upper Extremity Assessment Upper Extremity Assessment: Generalized weakness (BUE shoulder flexion/abduction: 3+/5, IR/er: 4-/5, bicep flexion/extension: 4+/5, functional gross grasp)   Lower Extremity Assessment Lower Extremity Assessment: Defer to PT evaluation       Communication Communication Communication: No difficulties   Cognition Arousal/Alertness: Awake/alert Behavior During Therapy: WFL for tasks assessed/performed Overall Cognitive Status: Impaired/Different from baseline Area of Impairment: Attention, Safety/judgement, Awareness       Safety/Judgement: Decreased awareness of safety     General Comments: Was focused and distracted about speaking with nursing and/or MD regarding her BP medication during evaluation. Required increased VC for direction following, sequencing and safety awareness related to line management and RW use.                Home Living Family/patient expects to be discharged to:: Private residence Living Arrangements: Spouse/significant other Available Help at Discharge: Family;Available PRN/intermittently Type of Home: House Home Access: Stairs to enter [employer] of Steps: 5 Entrance Stairs-Rails: Left Home Layout: One level     Bathroom Shower/Tub: Teacher, early years/pre: Standard     Home Equipment: Chartered certified accountant      Lives With: Spouse    Prior Functioning/Environment Prior Level of Function : Independent/Modified Independent            OT Problem List: Decreased strength;Decreased safety awareness      OT Treatment/Interventions:      OT Goals(Current goals can be found in the  care plan section) Acute Rehab OT Goals Patient Stated Goal: pt's goal is to locate her Nurse and find out if she can have BP medication OT Goal Formulation: All assessment and education complete, DC therapy  OT Frequency:  1 time visit       AM-PAC OT "6 Clicks" Daily Activity     Outcome Measure Help from another person eating meals?: None Help from another person taking care of personal grooming?: None Help from another person toileting, which includes using toliet, bedpan, or urinal?: A Little Help from another person bathing (including washing, rinsing, drying)?: A Little Help from another person to put on and taking off regular upper body clothing?: None Help from another person to put on and taking off regular lower body clothing?: A Little 6 Click Score: 21   End of Session Equipment Utilized During Treatment: Rolling walker (2 wheels) Nurse Communication: Patient requests pain meds;Mobility status  Activity Tolerance: Patient tolerated treatment well Patient left: in bed;with call bell/phone within reach;with nursing/sitter in room  OT Visit Diagnosis: Muscle weakness (generalized) (M62.81)                Time: 2637-8588 OT Time Calculation (min): 53 min Charges:  OT General Charges $OT Visit: 1 Visit OT Evaluation $OT Eval Low Complexity: 1 Low  Ailene Ravel, OTR/L,CBIS  Supplemental OT - [facility] and [facility]   Kay Ricciuti, Clarene Duke 09/03/2021, 3:23 PM

## 2021-09-03 NOTE — Evaluation (Signed)
Speech Language Pathology Evaluation Patient Details Name: [name] MRN: [id number] DOB: [birth year] Today's Date: 09/03/2021 Time: 2703-5009 SLP Time Calculation (min) (ACUTE ONLY): 27 min  Problem List:  Patient Active Problem List   Diagnosis Date Noted   Alcohol dependence with withdrawal delirium ([place]) 09/02/2021   Vitamin D deficiency 03/13/2021   Microhematuria 03/13/2021   Positive blood cultures    Asthma exacerbation 07/22/2020   Hyperkalemia 07/22/2020   PAF (paroxysmal atrial fibrillation) ([place]) 04/01/2020   Atrial flutter with rapid ventricular response ([place]) 04/01/2020   Alcohol dependence with withdrawal ([place]) 12/19/2019   History of pulmonary embolism 12/19/2019   Pneumonia due to COVID-19 virus 02/10/2019   Pulmonary embolism ([place]) 07/06/2018   Leukocytosis 09/22/2017   Rash 09/22/2017   Sepsis ([place]) 09/12/2016   Colovesical fistula 09/12/2016   Alcohol abuse 07/28/2016   Colonic diverticulum 07/28/2016   Hyperlipidemia 07/10/2016   Mass of left side of neck 07/10/2016   Head and neck lymphadenopathy 07/10/2016   Eustachian tube disorder 01/25/2016   Peripheral edema 03/16/2015   Asthma 03/16/2015   Right shoulder pain 03/16/2015   Hypokalemia 10/04/2014   Upper airway cough syndrome 10/03/2014   Obesity, Class III, BMI 40-49.9 (morbid obesity) ([place]) 10/03/2014   Lower back pain 05/25/2014   Bilateral shoulder pain 05/25/2014   Allergic rhinitis 12/06/2013   Impaired glucose tolerance 12/06/2013   Expected blood loss anemia 12/16/2012   Morbid obesity ([place]) 12/15/2012   S/P left TKA 12/14/2012   Goiter 11/26/2012   Preop exam for internal medicine 11/26/2012   Vaginal bleeding 04/30/2011   Colon polyps 02/10/2011   Eczema 02/10/2011   Depression 02/10/2011   Encounter for well adult exam with abnormal findings 09/27/2010   Anxiety state 04/05/2010   DIVERTICULITIS, HX OF 04/05/2010   PALPITATIONS, HX OF 09/14/2007   Essential hypertension  04/24/2007   VOCAL CORD DISORDER 04/24/2007   Extrinsic asthma 04/24/2007   GERD 04/24/2007   Past Medical History:  Past Medical History:  Diagnosis Date   Abscess of bladder 07/28/2016   Alcohol dependence ([place])    Allergic rhinitis 12/06/2013   Asthma 03/16/2015   Atrial flutter with rapid ventricular response ([place]) 04/01/2020   COLONIC POLYPS, HX OF 04/05/2010   DIVERTICULITIS, HX OF 04/05/2010   DJD (degenerative joint disease)    right knee, mot to severe   GERD (gastroesophageal reflux disease)    no meds   Heart murmur    hx of    Hyperlipidemia    Hypertension    Impaired glucose tolerance 12/06/2013   Morbid obesity with BMI of 50.0-59.9, adult [facility])    Past Surgical History:  Past Surgical History:  Procedure Laterality Date   ABDOMINAL HYSTERECTOMY  age 68   fibroids   COLONOSCOPY WITH PROPOFOL N/A 07/25/2016   Procedure: COLONOSCOPY WITH PROPOFOL;  Surgeon: Carol Ada, MD;  Location: [facility];  Service: Endoscopy;  Laterality: N/A;   colonscopy     x 2   IR RADIOLOGIST EVAL & MGMT  08/12/2016   IR RADIOLOGIST EVAL & MGMT  08/21/2016   KNEE ARTHROSCOPY     left    TOTAL KNEE ARTHROPLASTY  07/29/2011   Procedure: TOTAL KNEE ARTHROPLASTY;  Surgeon: Mauri Pole, MD;  Location: [facility];  Service: Orthopedics;  Laterality: Right;   TOTAL KNEE ARTHROPLASTY Left 12/14/2012   Procedure: LEFT TOTAL KNEE ARTHROPLASTY;  Surgeon: Mauri Pole, MD;  Location: [facility];  Service: Orthopedics;  Laterality: Left;   HPI:  Pt is a 61 y/o female admitted secondary to alcohol withdrawal and hallucinations. PMH includes hypertension, alcohol use disorder, atrial fibrillation.   Assessment / Plan / Recommendation Clinical Impression  Pt participated in cognitive-linguistic assessment, demonstrating functional orientation, selective attention, verbal problem solving and memory.  She followed multi-step commands and communicated her needs without difficulty.  She was  intermittently sleepy and occasionally required repeated questions.  She talked openly about her ETOH dependence and the stressors in her life; provided active listening and encouragement. No SLP f/u is recommended.    SLP Assessment  SLP Recommendation/Assessment: Patient does not need any further Speech [place] [facility] SLP Visit Diagnosis: Cognitive communication deficit (R41.841)    Recommendations for follow up therapy are one component of a multi-disciplinary discharge planning process, led by the attending physician.  Recommendations may be updated based on patient status, additional functional criteria and insurance authorization.    Follow Up Recommendations  No SLP follow up                       SLP Evaluation Cognition  Overall Cognitive Status: Within Functional Limits for tasks assessed Arousal/Alertness: Awake/alert Orientation Level: Oriented to time;Oriented to place;Oriented to situation Attention: Selective Selective Attention: Appears intact Memory: Appears intact Awareness: Appears intact Problem Solving: Appears intact       Comprehension  Auditory Comprehension Overall Auditory Comprehension: Appears within functional limits for tasks assessed Visual Recognition/Discrimination Discrimination: Within Function Limits Reading Comprehension Reading Status: Unable to assess (comment)    Expression Expression Primary Mode of Expression: Verbal Verbal Expression Overall Verbal Expression: Appears within functional limits for tasks assessed Written Expression Dominant Hand: Right   Oral / Motor  Oral Motor/Sensory Function Overall Oral Motor/Sensory Function: Within functional limits Motor Speech Overall Motor Speech: Appears within functional limits for tasks assessed           Shimika Ames L. Tivis Ringer, MA CCC/SLP Clinical Specialist - Acute Care SLP Acute Rehabilitation Services Office number [phone]  Juan Quam Laurice 09/03/2021,  9:44 AM

## 2021-09-03 NOTE — Progress Notes (Signed)
PROGRESS NOTE   [name]  HMC:[id number] DOB: [birth year] DOA: 09/02/2021 PCP: Biagio Borg, MD  Brief Narrative:  61 year old black female Known colovaginal fistula in the past as well as prior bladder/colonic abscesses with percutaneous drainage in 2018 A-fib CHADVASC >3+ prior PE (06/2018) on Eliquis Mild intermittent asthma HTN HLD Prior episodic heavy ethanolism Impaired glucose tolerance  Been drinking heavily recently about a half a gallon of vodka with resulting tremors/visual hallucinations  Hospital-Problem based course  Acute alcohol withdrawal She is still having some hallucinations and seeing things She prefers not to use Librium as she has side effect from this--giving scheduled diazepam 5 mg every 6 for now CIWA a little bit better although she is quite tremulous continue Ativan protocol--if this becomes an issue can use phenobarbital Would continue LR 75 cc/H but can saline lock in a.m. AKI from admission Probably secondary to volume depletion as well as heavy drinking Creatinine is now in the 5 range-expect will come down holding Hyzaar  If no better would get renal ultrasound and UA in the a.m. and speak to nephrology A-fib CHADVASC >3 Continue Toprol-XL 25 daily monitor trends Continue Eliquis 5 twice daily Prior DVT/PE in 2020 Eliquis as above Elevated blood pressure Continue metoprolol XL 25 daily hydralazine for pressures above 180 Hyzaar on hold from admission  DVT prophylaxis: Eliquis 5 twice daily Code Status: Full Family Communication: None present Disposition:  Status is: Inpatient Remains inpatient appropriate because:   AKI and withdrawal   Consultants:  None  Procedures: No  Antimicrobials: No   Subjective: Coherent-tells me he has been drinking for 3 weeks No chest pain No fever  Objective: Vitals:   09/02/21 2115 09/02/21 2157 09/03/21 0005 09/03/21 0343  BP: (!) 174/115 (!) 176/95 (!) 146/98 (!) 156/96  Pulse: 98 98  99 (!) 109  Resp: (!) '22 20 20 19  '$ Temp:  98.5 F (36.9 C) 98.5 F (36.9 C) 98.7 F (37.1 C)  TempSrc:  Oral Oral Oral  SpO2: 97%  95% 97%  Weight:      Height:       No intake or output data in the 24 hours ending 09/03/21 0740 Filed Weights   09/02/21 0740  Weight: 135 kg    Examination:  Obese coherent pleasant black female no distress Chest clear no rales rhonchi No wheeze Abdomen obese poor exam ROM intact grossly moving all 4 limbs  Data Reviewed: personally reviewed   CBC    Component Value Date/Time   WBC 11.5 (H) 09/03/2021 0321   RBC 3.74 (L) 09/03/2021 0321   HGB 11.4 (L) 09/03/2021 0321   HCT 34.1 (L) 09/03/2021 0321   PLT 231 09/03/2021 0321   MCV 91.2 09/03/2021 0321   MCH 30.5 09/03/2021 0321   MCHC 33.4 09/03/2021 0321   RDW 14.8 09/03/2021 0321   LYMPHSABS 1.4 06/13/2021 2040   MONOABS 0.8 06/13/2021 2040   EOSABS 0.1 06/13/2021 2040   BASOSABS 0.1 06/13/2021 2040      Latest Ref Rng & Units 09/03/2021    3:21 AM 09/02/2021    7:53 AM 06/13/2021    8:40 PM  CMP  Glucose 70 - 99 mg/dL 108  110  106   BUN 6 - 20 mg/dL 37  30  20   Creatinine 0.44 - 1.00 mg/dL 5.09  3.58  1.76   Sodium 135 - 145 mmol/L 137  141  138   Potassium 3.5 - 5.1 mmol/L 4.5  4.4  3.5  Chloride 98 - 111 mmol/L 102  105  100   CO2 22 - 32 mmol/L '24  22  25   '$ Calcium 8.9 - 10.3 mg/dL 8.4  8.9  9.1   Total Protein 6.5 - 8.1 g/dL  7.3  7.1   Total Bilirubin 0.3 - 1.2 mg/dL  0.5  0.7   Alkaline Phos 38 - 126 U/L  129  110   AST 15 - 41 U/L  67  57   ALT 0 - 44 U/L  58  55      Radiology Studies: No results found.   Scheduled Meds:  apixaban  5 mg Oral BID   atorvastatin  20 mg Oral Daily   diazepam  5 mg Oral Q6H   docusate sodium  100 mg Oral BID   folic acid  1 mg Oral Daily   LORazepam  0-4 mg Intravenous Q4H   Followed by   Derrill Memo ON 09/04/2021] LORazepam  0-4 mg Intravenous Q8H   metoprolol succinate  25 mg Oral Daily   multivitamin with minerals  1  tablet Oral Daily   sodium chloride flush  3 mL Intravenous Q12H   thiamine  100 mg Oral Daily   Or   thiamine  100 mg Intravenous Daily   Continuous Infusions:  lactated ringers 75 mL/hr at 09/02/21 2200     LOS: 1 day   Time spent: [place], MD [facility] To contact the attending provider between 7A-7P or the covering provider during after hours 7P-7A, please log into the web site [email or web address] and access using universal [place] password for that web site. If you do not have the password, please call the hospital operator.  09/03/2021, 7:40 AM

## 2021-09-04 ENCOUNTER — Inpatient Hospital Stay (HOSPITAL_COMMUNITY): Payer: 59

## 2021-09-04 DIAGNOSIS — F10231 Alcohol dependence with withdrawal delirium: Secondary | ICD-10-CM | POA: Diagnosis not present

## 2021-09-04 DIAGNOSIS — F10932 Alcohol use, unspecified with withdrawal with perceptual disturbance: Secondary | ICD-10-CM

## 2021-09-04 DIAGNOSIS — I10 Essential (primary) hypertension: Secondary | ICD-10-CM

## 2021-09-04 DIAGNOSIS — Z86711 Personal history of pulmonary embolism: Secondary | ICD-10-CM

## 2021-09-04 LAB — URINALYSIS, ROUTINE W REFLEX MICROSCOPIC
Bilirubin Urine: NEGATIVE
Glucose, UA: NEGATIVE mg/dL
Ketones, ur: NEGATIVE mg/dL
Nitrite: NEGATIVE
Protein, ur: NEGATIVE mg/dL
Specific Gravity, Urine: 1.005 (ref 1.005–1.030)
pH: 8 (ref 5.0–8.0)

## 2021-09-04 LAB — CBC WITH DIFFERENTIAL/PLATELET
Abs Immature Granulocytes: 0.65 10*3/uL — ABNORMAL HIGH (ref 0.00–0.07)
Basophils Absolute: 0.1 10*3/uL (ref 0.0–0.1)
Basophils Relative: 0 %
Eosinophils Absolute: 0 10*3/uL (ref 0.0–0.5)
Eosinophils Relative: 0 %
HCT: 34.5 % — ABNORMAL LOW (ref 36.0–46.0)
Hemoglobin: 11.2 g/dL — ABNORMAL LOW (ref 12.0–15.0)
Immature Granulocytes: 2 %
Lymphocytes Relative: 3 %
Lymphs Abs: 0.7 10*3/uL (ref 0.7–4.0)
MCH: 30 pg (ref 26.0–34.0)
MCHC: 32.5 g/dL (ref 30.0–36.0)
MCV: 92.5 fL (ref 80.0–100.0)
Monocytes Absolute: 0.5 10*3/uL (ref 0.1–1.0)
Monocytes Relative: 2 %
Neutro Abs: 26.2 10*3/uL — ABNORMAL HIGH (ref 1.7–7.7)
Neutrophils Relative %: 93 %
Platelets: 188 10*3/uL (ref 150–400)
RBC: 3.73 MIL/uL — ABNORMAL LOW (ref 3.87–5.11)
RDW: 14.8 % (ref 11.5–15.5)
WBC: 28.2 10*3/uL — ABNORMAL HIGH (ref 4.0–10.5)
nRBC: 0.1 % (ref 0.0–0.2)

## 2021-09-04 LAB — BASIC METABOLIC PANEL
Anion gap: 12 (ref 5–15)
BUN: 46 mg/dL — ABNORMAL HIGH (ref 6–20)
CO2: 21 mmol/L — ABNORMAL LOW (ref 22–32)
Calcium: 8.8 mg/dL — ABNORMAL LOW (ref 8.9–10.3)
Chloride: 102 mmol/L (ref 98–111)
Creatinine, Ser: 7.14 mg/dL — ABNORMAL HIGH (ref 0.44–1.00)
GFR, Estimated: 6 mL/min — ABNORMAL LOW (ref >60)
Glucose, Bld: 128 mg/dL — ABNORMAL HIGH (ref 70–99)
Potassium: 4.5 mmol/L (ref 3.5–5.1)
Sodium: 135 mmol/L (ref 135–145)

## 2021-09-04 LAB — CBC
HCT: 34 % — ABNORMAL LOW (ref 36.0–46.0)
Hemoglobin: 11.2 g/dL — ABNORMAL LOW (ref 12.0–15.0)
MCH: 29.9 pg (ref 26.0–34.0)
MCHC: 32.9 g/dL (ref 30.0–36.0)
MCV: 90.9 fL (ref 80.0–100.0)
Platelets: 215 10*3/uL (ref 150–400)
RBC: 3.74 MIL/uL — ABNORMAL LOW (ref 3.87–5.11)
RDW: 14.6 % (ref 11.5–15.5)
WBC: 20.8 10*3/uL — ABNORMAL HIGH (ref 4.0–10.5)
nRBC: 0.1 % (ref 0.0–0.2)

## 2021-09-04 LAB — COMPREHENSIVE METABOLIC PANEL
ALT: 46 U/L — ABNORMAL HIGH (ref 0–44)
AST: 58 U/L — ABNORMAL HIGH (ref 15–41)
Albumin: 2.7 g/dL — ABNORMAL LOW (ref 3.5–5.0)
Alkaline Phosphatase: 119 U/L (ref 38–126)
Anion gap: 13 (ref 5–15)
BUN: 53 mg/dL — ABNORMAL HIGH (ref 6–20)
CO2: 19 mmol/L — ABNORMAL LOW (ref 22–32)
Calcium: 8.7 mg/dL — ABNORMAL LOW (ref 8.9–10.3)
Chloride: 100 mmol/L (ref 98–111)
Creatinine, Ser: 8.22 mg/dL — ABNORMAL HIGH (ref 0.44–1.00)
GFR, Estimated: 5 mL/min — ABNORMAL LOW (ref >60)
Glucose, Bld: 134 mg/dL — ABNORMAL HIGH (ref 70–99)
Potassium: 5.2 mmol/L — ABNORMAL HIGH (ref 3.5–5.1)
Sodium: 132 mmol/L — ABNORMAL LOW (ref 135–145)
Total Bilirubin: 0.7 mg/dL (ref 0.3–1.2)
Total Protein: 6.6 g/dL (ref 6.5–8.1)

## 2021-09-04 LAB — PROCALCITONIN: Procalcitonin: 57.93 ng/mL

## 2021-09-04 MED ORDER — SODIUM CHLORIDE 0.9 % IV SOLN
1.0000 g | INTRAVENOUS | Status: DC
  Administered 2021-09-04: 1 g via INTRAVENOUS
  Filled 2021-09-04: qty 10

## 2021-09-04 MED ORDER — SODIUM ZIRCONIUM CYCLOSILICATE 10 G PO PACK
10.0000 g | PACK | Freq: Once | ORAL | Status: AC
Start: 2021-09-05 — End: 2021-09-05
  Administered 2021-09-05: 10 g via ORAL
  Filled 2021-09-04: qty 1

## 2021-09-04 MED ORDER — CHLORHEXIDINE GLUCONATE CLOTH 2 % EX PADS
6.0000 | MEDICATED_PAD | Freq: Every day | CUTANEOUS | Status: DC
  Administered 2021-09-04 – 2021-09-05 (×2): 6 via TOPICAL

## 2021-09-04 NOTE — Consult Note (Signed)
   [facility] Inpatient Consult   09/04/2021  ALEICIA KENAGY October 17, 1960 [id number]  [place] [facility] [ACO] Patient: Holland Falling Medicare  Primary Care Provider:  Biagio Borg, MD, is with [facility] noted,  is an embedded provider with a Chronic Care Management team and program, and is listed for the transition of care follow up and appointments.  Patient was screened for Embedded practice service needs for chronic care management.  Chart reviewed for high risk score and for post hospital follow up needs.  Came to the bedside and patient was sound asleep, respirations even and non labored, did not awaken to normal voice name call.   Will continue to follow for needs.  Plan: A referral can be sent to the [facility] for post hospital TOC needs.   Please contact for further questions,  Natividad Brood, RN BSN [facility] Liaison  [phone] business mobile phone Toll free office [phone]  Fax number: [phone] [email or web address]

## 2021-09-04 NOTE — Plan of Care (Signed)

## 2021-09-04 NOTE — Progress Notes (Signed)
Mobility Specialist Progress Note   09/04/21 1110  Mobility  Activity Ambulated with assistance in hallway  Level of Assistance Contact guard assist, steadying assist  Assistive Device Front wheel walker  Distance Ambulated (ft) 198 ft  Activity Response Tolerated well  $Mobility charge 1 Mobility   Pre Mobility: 110 HR, 116/65 BP, 93% SpO2 During Mobility: 126 HR,  90% SpO2 Post Mobility: 115 HR, 117/75 BP, 92% SpO2  Received in bed slightly lethargic and confused but agreeable. Required MinA to get EOB d/t trunk weakness but CG for remainder of session. Mod directional cues required throughout, pt had decreased spatial awareness resulting in pt hitting multiple objects and turning radomly w/o knowing why. Pt blamed it on valium they had taken. X3 standing rest breaks + x1 seated rest break d/t DOE and fatigue but pt maintained >90% SpO2 throughout. Returned back to bed w/o incident, call bell in reach and bed alarm on.   Holland Falling Mobility Specialist [place] #:  [phone] Acute Rehab Office:  [phone]

## 2021-09-04 NOTE — Progress Notes (Addendum)
Writer gave '2mg'$  of ativan for tremors and severe anxiety, sleeping now, page rapid responds nurse to come over and check on patient as she was shaking and very anxious, VSS except Heart rate which has been high in 120s, will continue to monitor

## 2021-09-04 NOTE — Progress Notes (Signed)
[facility]                                                                              [name], is a 61 y.o. female, DOB - [birth year], ZOX:[id number] Admit date - 09/02/2021    Outpatient Primary MD for the patient is Biagio Borg, MD  LOS - 2  days  Chief Complaint  Patient presents with   Alcohol Problem   Tremors   Hallucinations       Brief summary   Patient is a 61 year old female with known colovaginal fistula in the past, prior bladder and colonic abscesses with pelvic drainage in 2018, A-fib on Eliquis, mild intermittent asthma, HTN, HLD, alcohol abuse has been drinking heavily recently Mylan-Fingolimod, and the resulting tremors, visual hallucinations.   Assessment & Plan    Principal Problem:   Alcohol dependence with withdrawal delirium (HCC) -Overnight had visual hallucination and tremulous -CIWA at 4 AM 7 -Continue scheduled Valium and  -Counseled strongly on alcohol cessation  Active Problems: Acute kidney injury with leukocytosis, concern for UTI/pyelonephritis/ obstructive uropathy -Possibly due to volume depletion, concern for possible obstructive uropathy -Baseline creatinine 1.0, presented with a creatinine of 3.58-> 5.09-> now trended up to 7.1 -Obtain UA, culture, normal saline IV fluids at 125 cc an hour -Obtain differential, blood cultures, procalcitonin -Patient had CT abdomen in 06/2021 which had shown mild bilateral hydronephrosis and bilateral obstructing stones, obtain stat renal ultrasound, will place Foley catheter - nephrology consulted, discussed with Dr. Hollie Salk -If UA positive for UTI, will place on IV Rocephin  Tachycardia, paroxysmal atrial fibrillation -Continue aggressive IV fluid hydration, continue Toprol-XL -Continue eliquis    Essential hypertension  -BP currently soft, continue Toprol-XL    Hyperlipidemia Continue Lipitor    History of pulmonary embolism -Continue eliquis  Morbid  obesity Estimated body mass index is 56.24 kg/m as calculated from the following:   Height as of this encounter: '5\' 1"'$  (1.549 m).   Weight as of this encounter: 135 kg.  Code Status: Full CODE STATUS DVT Prophylaxis:   apixaban (ELIQUIS) tablet 5 mg   Level of Care: Level of care: Progressive Family Communication: Updated patient   Disposition Plan:      Remains inpatient appropriate:    Procedures:  None  Consultants:   Nephrology  Antimicrobials:   Anti-infectives (From admission, onward)    None          Medications  apixaban  5 mg Oral BID   atorvastatin  20 mg Oral Daily   diazepam  5 mg Oral Q6H   docusate sodium  100 mg Oral BID   folic acid  1 mg Oral Daily   LORazepam  0-4 mg Intravenous Q8H   metoprolol succinate  25 mg Oral Daily   multivitamin with minerals  1 tablet Oral Daily   pantoprazole  40 mg Oral Daily   sodium chloride flush  3 mL Intravenous Q12H   thiamine  100 mg Oral Daily      Subjective:   [name] was seen and examined today.  Still tachycardiac, states had visual hallucinations last night, low-grade temp of 99.6  F.  Was tremulous overnight but improving.  No acute nausea vomiting diarrhea, chest pain or shortness of breath.  Currently no pain  Objective:   Vitals:   09/04/21 0400 09/04/21 0600 09/04/21 0827 09/04/21 1259  BP: (!) 153/67 (!) 153/63 (!) 106/55 115/66  Pulse: (!) 120 (!) 115 (!) 120 (!) 103  Resp: 20  20   Temp: 98.4 F (36.9 C)  99.6 F (37.6 C) 98.7 F (37.1 C)  TempSrc: Oral  Oral Oral  SpO2: 99%  95% 96%  Weight:      Height:        Intake/Output Summary (Last 24 hours) at 09/04/2021 1711 Last data filed at 09/04/2021 1000 Gross per 24 hour  Intake 240 ml  Output --  Net 240 ml     Wt Readings from Last 3 Encounters:  09/02/21 135 kg  04/17/21 135.4 kg  03/31/21 129.3 kg     Exam General: Alert and oriented x 3, NAD Cardiovascular: S1 S2 auscultated,  RRR Respiratory: Clear to  auscultation bilaterally, no wheezing Gastrointestinal: Soft, nontender, nondistended, + bowel sounds Ext: no pedal edema bilaterally Neuro: Strength 5/5 upper and lower extremities bilaterally Psych: normal affect    Data Reviewed:  I have personally reviewed following labs    CBC Lab Results  Component Value Date   WBC 20.8 (H) 09/04/2021   RBC 3.74 (L) 09/04/2021   HGB 11.2 (L) 09/04/2021   HCT 34.0 (L) 09/04/2021   MCV 90.9 09/04/2021   MCH 29.9 09/04/2021   PLT 215 09/04/2021   MCHC 32.9 09/04/2021   RDW 14.6 09/04/2021   LYMPHSABS 1.4 06/13/2021   MONOABS 0.8 06/13/2021   EOSABS 0.1 06/13/2021   BASOSABS 0.1 16/11/9602     Last metabolic panel Lab Results  Component Value Date   NA 135 09/04/2021   K 4.5 09/04/2021   CL 102 09/04/2021   CO2 21 (L) 09/04/2021   BUN 46 (H) 09/04/2021   CREATININE 7.14 (H) 09/04/2021   GLUCOSE 128 (H) 09/04/2021   GFRNONAA 6 (L) 09/04/2021   GFRAA >60 09/29/2019   CALCIUM 8.8 (L) 09/04/2021   PHOS 4.3 12/22/2019   PROT 7.3 09/02/2021   ALBUMIN 3.0 (L) 09/02/2021   BILITOT 0.5 09/02/2021   ALKPHOS 129 (H) 09/02/2021   AST 67 (H) 09/02/2021   ALT 58 (H) 09/02/2021   ANIONGAP 12 09/04/2021       Mclane Arora M.D. [facility] 09/04/2021, 5:11 PM  Available via Epic secure chat 7am-7pm After 7 pm, please refer to night coverage provider listed on amion.

## 2021-09-04 NOTE — Consult Note (Signed)
[name] is an 61 y.o. female.    Chief Complaint: alcohol withdrawal  HPI: Pt is a 48F with a PMH sig for HTN, HLD, obesity, Afib with RVR, h/o bladder abscess who is now seen for evaluation of AKI.    Pt was admitted 09/02/21 for alcohol withdrawal.  Had been drinking 2 5ths of liquor a day and decided to stop cold [place] the day before admission.  She started having hallucinations and started getting shaky.  She presented to [facility] ED for evaluation.  Once there, was found to have an AKI of 3.95, up from 1.76 in May which was up from 0.95-1.05 2-04/2021.    She was taking Hyzaar- this was stopped.  She was given fluids in the ED but these were not continued on the floor.  Yesterday her Cr was ~5 and today it's 7.14.  In this setting we are asked to see.  Review of record indicates an ED visit 06/2021 for kidney stones.  Was found to have R obstructing UPJ stone and large L UPJ stone.  She has never seen a urologist.  Tells me that she hasn't ingested any homemade alcohol or non-ethanol alcohols.  Right now she's a little confused.  Foley just placed- ~100 mL yellow urine returned.  She is getting CXR and UA/ culture and sepsis workup for WBC ct of 20.5.    PMH: Past Medical History:  Diagnosis Date   Abscess of bladder 07/28/2016   Alcohol dependence ([place])    Allergic rhinitis 12/06/2013   Asthma 03/16/2015   Atrial flutter with rapid ventricular response ([place]) 04/01/2020   COLONIC POLYPS, HX OF 04/05/2010   DIVERTICULITIS, HX OF 04/05/2010   DJD (degenerative joint disease)    right knee, mot to severe   GERD (gastroesophageal reflux disease)    no meds   Heart murmur    hx of    Hyperlipidemia    Hypertension    Impaired glucose tolerance 12/06/2013   Morbid obesity with BMI of 50.0-59.9, adult ([place])    PSH: Past Surgical History:  Procedure Laterality Date   ABDOMINAL HYSTERECTOMY  age 74   fibroids   COLONOSCOPY WITH  PROPOFOL N/A 07/25/2016   Procedure: COLONOSCOPY WITH PROPOFOL;  Surgeon: Carol Ada, MD;  Location: [facility] ENDOSCOPY;  Service: Endoscopy;  Laterality: N/A;   colonscopy     x 2   IR RADIOLOGIST EVAL & MGMT  08/12/2016   IR RADIOLOGIST EVAL & MGMT  08/21/2016   KNEE ARTHROSCOPY     left    TOTAL KNEE ARTHROPLASTY  07/29/2011   Procedure: TOTAL KNEE ARTHROPLASTY;  Surgeon: Mauri Pole, MD;  Location: [facility] ORS;  Service: Orthopedics;  Laterality: Right;   TOTAL KNEE ARTHROPLASTY Left 12/14/2012   Procedure: LEFT TOTAL KNEE ARTHROPLASTY;  Surgeon: Mauri Pole, MD;  Location: [facility] ORS;  Service: Orthopedics;  Laterality: Left;    Past Medical History:  Diagnosis Date   Abscess of bladder 07/28/2016   Alcohol dependence ([place])    Allergic rhinitis 12/06/2013   Asthma 03/16/2015   Atrial flutter with rapid ventricular response ([place]) 04/01/2020   COLONIC POLYPS, HX OF 04/05/2010   DIVERTICULITIS, HX OF 04/05/2010   DJD (degenerative joint disease)    right knee, mot to severe   GERD (gastroesophageal reflux disease)    no meds   Heart murmur    hx of    Hyperlipidemia    Hypertension  Impaired glucose tolerance 12/06/2013   Morbid obesity with BMI of 50.0-59.9, adult ([facility])     Medications:  Scheduled:  apixaban  5 mg Oral BID   atorvastatin  20 mg Oral Daily   diazepam  5 mg Oral Q6H   docusate sodium  100 mg Oral BID   folic acid  1 mg Oral Daily   LORazepam  0-4 mg Intravenous Q8H   metoprolol succinate  25 mg Oral Daily   multivitamin with minerals  1 tablet Oral Daily   pantoprazole  40 mg Oral Daily   sodium chloride flush  3 mL Intravenous Q12H   thiamine  100 mg Oral Daily    Medications Prior to Admission  Medication Sig Dispense Refill   acetaminophen (TYLENOL) 500 MG tablet Take 250 mg by mouth every 6 (six) hours as needed for headache or moderate pain.     albuterol (VENTOLIN HFA) 108 (90 Base) MCG/ACT inhaler 2 puffs every 6 hrs as needed (Patient taking  differently: Inhale 2 puffs into the lungs every 6 (six) hours as needed for wheezing or shortness of breath.) 8.5 each 11   alum & mag hydroxide-simeth (MAALOX/MYLANTA) 200-200-20 MG/5ML suspension Take 30 mLs by mouth every 6 (six) hours as needed for indigestion or heartburn.     apixaban (ELIQUIS) 5 MG TABS tablet Take 1 tablet (5 mg total) by mouth 2 (two) times daily. 60 tablet 11   atorvastatin (LIPITOR) 20 MG tablet Take 1 tablet (20 mg total) by mouth daily. 90 tablet 3   HYDROcodone-acetaminophen (NORCO/VICODIN) 5-325 MG tablet Take 1 tablet by mouth every 4 (four) hours as needed for moderate pain. 20 tablet 0   losartan-hydrochlorothiazide (HYZAAR) 100-25 MG tablet TAKE 1 TABLET BY MOUTH EVERY DAY (Patient taking differently: Take 1 tablet by mouth daily.) 90 tablet 2   metoprolol succinate (TOPROL-XL) 25 MG 24 hr tablet Take 1 tablet (25 mg total) by mouth daily. 30 tablet 11   ondansetron (ZOFRAN-ODT) 4 MG disintegrating tablet Take 1 tablet (4 mg total) by mouth every 8 (eight) hours as needed for nausea or vomiting. 20 tablet 0   Polyethyl Glycol-Propyl Glycol (SYSTANE OP) Place 1 drop into both eyes 2 (two) times daily as needed (dry eyes).     triamcinolone cream (KENALOG) 0.1 % APPLY TO AFFECTED AREA TWICE A DAY (Patient taking differently: Apply 1 Application topically 2 (two) times daily.) 30 g 1    ALLERGIES:   Allergies  Allergen Reactions   Morphine Itching   Shrimp [Shellfish Allergy] Itching and Other (See Comments)    Tongue burns also   Tramadol Other (See Comments)    Caused confusion   Covid-19 (Mrna) Vaccine Hives   Diltiazem Hcl Itching    Pt with itching of the feet when bolus given   Other Other (See Comments)   Latex Rash    FAM HX: Family History  Problem Relation Age of Onset   Stroke Mother    COPD Father    Lymphoma Sister    Alcoholism Sister     Social History:   reports that she quit smoking about 33 years ago. Her smoking use included  cigarettes. She has a 3.50 pack-year smoking history. She has never used smokeless tobacco. She reports current alcohol use. She reports that she does not currently use drugs after having used the following drugs: Marijuana.  ROS: ROS: all systems reviewed and are negative except as per HPI  Blood pressure 115/66, pulse (!) 103, temperature 98.7  F (37.1 C), temperature source Oral, resp. rate 20, height '5\' 1"'$  (1.549 m), weight 135 kg, SpO2 96 %.  PHYSICAL EXAM: Physical Exam GEN NAD, lying in bed HEENT EOMI NECK no JVD PULM clear bilaterally no c/w/r CV RRR ABD soft, obese  EXT trace LE edema NEURO + confused, some tremulousness SKIN warm and dry  Results for orders placed or performed during the hospital encounter of 09/02/21 (from the past 48 hour(s))  Basic metabolic panel     Status: Abnormal   Collection Time: 09/03/21  3:21 AM  Result Value Ref Range   Sodium 137 135 - 145 mmol/L   Potassium 4.5 3.5 - 5.1 mmol/L   Chloride 102 98 - 111 mmol/L   CO2 24 22 - 32 mmol/L   Glucose, Bld 108 (H) 70 - 99 mg/dL    Comment: Glucose reference range applies only to samples taken after fasting for at least 8 hours.   BUN 37 (H) 6 - 20 mg/dL   Creatinine, Ser 5.09 (H) 0.44 - 1.00 mg/dL   Calcium 8.4 (L) 8.9 - 10.3 mg/dL   GFR, Estimated 9 (L) >60 mL/min    Comment: (NOTE) Calculated using the CKD-EPI Creatinine Equation (2021)    Anion gap 11 5 - 15    Comment: Performed at [facility] [address], [place], [place] [address]  CBC     Status: Abnormal   Collection Time: 09/03/21  3:21 AM  Result Value Ref Range   WBC 11.5 (H) 4.0 - 10.5 K/uL   RBC 3.74 (L) 3.87 - 5.11 MIL/uL   Hemoglobin 11.4 (L) 12.0 - 15.0 g/dL   HCT 34.1 (L) 36.0 - 46.0 %   MCV 91.2 80.0 - 100.0 fL   MCH 30.5 26.0 - 34.0 pg   MCHC 33.4 30.0 - 36.0 g/dL   RDW 14.8 11.5 - 15.5 %   Platelets 231 150 - 400 K/uL   nRBC 0.2 0.0 - 0.2 %    Comment: Performed at [facility], [place] [address], [place], [place] [address]  HIV Antibody (routine testing w rflx)     Status: None   Collection Time: 09/03/21  3:21 AM  Result Value Ref Range   HIV Screen 4th Generation wRfx Non Reactive Non Reactive    Comment: Performed at [facility], [place] [address] [place], [place] [address]  Basic metabolic panel     Status: Abnormal   Collection Time: 09/04/21  7:04 AM  Result Value Ref Range   Sodium 135 135 - 145 mmol/L   Potassium 4.5 3.5 - 5.1 mmol/L   Chloride 102 98 - 111 mmol/L   CO2 21 (L) 22 - 32 mmol/L   Glucose, Bld 128 (H) 70 - 99 mg/dL    Comment: Glucose reference range applies only to samples taken after fasting for at least 8 hours.   BUN 46 (H) 6 - 20 mg/dL   Creatinine, Ser 7.14 (H) 0.44 - 1.00 mg/dL   Calcium 8.8 (L) 8.9 - 10.3 mg/dL   GFR, Estimated 6 (L) >60 mL/min    Comment: (NOTE) Calculated using the CKD-EPI Creatinine Equation (2021)    Anion gap 12 5 - 15    Comment: Performed at [facility] [address] [place], [place] [address]  CBC     Status: Abnormal   Collection Time: 09/04/21  7:04 AM  Result Value Ref Range   WBC 20.8 (H) 4.0 - 10.5  K/uL   RBC 3.74 (L) 3.87 - 5.11 MIL/uL   Hemoglobin 11.2 (L) 12.0 - 15.0 g/dL   HCT 34.0 (L) 36.0 - 46.0 %   MCV 90.9 80.0 - 100.0 fL   MCH 29.9 26.0 - 34.0 pg   MCHC 32.9 30.0 - 36.0 g/dL   RDW 14.6 11.5 - 15.5 %   Platelets 215 150 - 400 K/uL   nRBC 0.1 0.0 - 0.2 %    Comment: Performed at [facility] [address] [place], [place] [address]    DG CHEST PORT 1 VIEW  Result Date: 09/04/2021 CLINICAL DATA:  Shortness of breath EXAM: PORTABLE CHEST 1 VIEW COMPARISON:  05/07/2021 FINDINGS: Transverse diameter heart is increased. Central pulmonary vessels are prominent. There are no signs of alveolar pulmonary edema or new focal infiltrates. Lateral CP angles are indistinct. There is no pneumothorax. IMPRESSION: Cardiomegaly. Central pulmonary vessels are prominent suggesting CHF. There are no  signs of alveolar pulmonary edema or focal pulmonary consolidation. Lateral costophrenic angles are indistinct which may be related to chest wall attenuation or pleural thickening or small effusions. Electronically Signed   By: Elmer Picker M.D.   On: 09/04/2021 18:05    Assessment/Plan  AKI: this may be a double hit from obstructive uropathy (detected 06/2021) and hypovolemia and hemodynamics in the setting of EtOH withdrawal and possible sepsis. - IVFs with NS @ 125 mL/ hr x 10 hrs - Foley placement - stat renal US - possibly will need urology c/s - no indication for serologic workup or HD now- will make daily assessments - low suspicion for toxic ingestion  2.  Alcohol withdrawal  - on CIWA protocol  3.  Leukocytosis/ sepsis:  - getting CXR, UA and culture  - procalcitonin, blood cultures  - antibiotics as appropriate per primary  4.  Afib  - on Eliquis and metoprolol  5.  Dispo: inpatient  Madelon Lips 09/04/2021, 6:16 PM

## 2021-09-05 ENCOUNTER — Inpatient Hospital Stay (HOSPITAL_COMMUNITY): Payer: 59 | Admitting: Anesthesiology

## 2021-09-05 ENCOUNTER — Encounter (HOSPITAL_COMMUNITY)
Admission: EM | Disposition: A | Discharge status: Home / Self Care | Payer: Self-pay | Source: Home / Self Care | Attending: Internal Medicine

## 2021-09-05 ENCOUNTER — Inpatient Hospital Stay (HOSPITAL_COMMUNITY): Payer: 59

## 2021-09-05 ENCOUNTER — Encounter (HOSPITAL_COMMUNITY): Payer: Self-pay | Admitting: Internal Medicine

## 2021-09-05 DIAGNOSIS — N390 Urinary tract infection, site not specified: Secondary | ICD-10-CM

## 2021-09-05 DIAGNOSIS — J4520 Mild intermittent asthma, uncomplicated: Secondary | ICD-10-CM | POA: Diagnosis not present

## 2021-09-05 DIAGNOSIS — I10 Essential (primary) hypertension: Secondary | ICD-10-CM | POA: Diagnosis not present

## 2021-09-05 DIAGNOSIS — E7800 Pure hypercholesterolemia, unspecified: Secondary | ICD-10-CM

## 2021-09-05 DIAGNOSIS — F10231 Alcohol dependence with withdrawal delirium: Secondary | ICD-10-CM | POA: Diagnosis not present

## 2021-09-05 DIAGNOSIS — N201 Calculus of ureter: Secondary | ICD-10-CM

## 2021-09-05 DIAGNOSIS — Z6841 Body Mass Index (BMI) 40.0 and over, adult: Secondary | ICD-10-CM

## 2021-09-05 DIAGNOSIS — A419 Sepsis, unspecified organism: Secondary | ICD-10-CM

## 2021-09-05 DIAGNOSIS — F10932 Alcohol use, unspecified with withdrawal with perceptual disturbance: Secondary | ICD-10-CM | POA: Diagnosis not present

## 2021-09-05 HISTORY — PX: CYSTOSCOPY W/ URETERAL STENT PLACEMENT: SHX1429

## 2021-09-05 LAB — BLOOD CULTURE ID PANEL (REFLEXED) - BCID2

## 2021-09-05 LAB — RENAL FUNCTION PANEL
Albumin: 2.7 g/dL — ABNORMAL LOW (ref 3.5–5.0)
Anion gap: 11 (ref 5–15)
BUN: 55 mg/dL — ABNORMAL HIGH (ref 6–20)
CO2: 22 mmol/L (ref 22–32)
Calcium: 8.9 mg/dL (ref 8.9–10.3)
Chloride: 100 mmol/L (ref 98–111)
Creatinine, Ser: 8.64 mg/dL — ABNORMAL HIGH (ref 0.44–1.00)
GFR, Estimated: 5 mL/min — ABNORMAL LOW (ref >60)
Glucose, Bld: 96 mg/dL (ref 70–99)
Phosphorus: 4.1 mg/dL (ref 2.5–4.6)
Potassium: 5.1 mmol/L (ref 3.5–5.1)
Sodium: 133 mmol/L — ABNORMAL LOW (ref 135–145)

## 2021-09-05 LAB — CK: Total CK: 426 U/L — ABNORMAL HIGH (ref 38–234)

## 2021-09-05 LAB — GLUCOSE, CAPILLARY: Glucose-Capillary: 134 mg/dL — ABNORMAL HIGH (ref 70–99)

## 2021-09-05 LAB — DIFFERENTIAL
Abs Immature Granulocytes: 1.18 10*3/uL — ABNORMAL HIGH (ref 0.00–0.07)
Basophils Absolute: 0.1 10*3/uL (ref 0.0–0.1)
Basophils Relative: 0 %
Eosinophils Absolute: 0.1 10*3/uL (ref 0.0–0.5)
Eosinophils Relative: 0 %
Immature Granulocytes: 4 %
Lymphocytes Relative: 3 %
Lymphs Abs: 0.8 10*3/uL (ref 0.7–4.0)
Monocytes Absolute: 0.8 10*3/uL (ref 0.1–1.0)
Monocytes Relative: 3 %
Neutro Abs: 26.6 10*3/uL — ABNORMAL HIGH (ref 1.7–7.7)
Neutrophils Relative %: 90 %

## 2021-09-05 SURGERY — CYSTOSCOPY, WITH RETROGRADE PYELOGRAM AND URETERAL STENT INSERTION
Anesthesia: General | Site: Ureter | Laterality: Right

## 2021-09-05 MED ORDER — ACETAMINOPHEN 325 MG PO TABS: 325.0000 mg | ORAL_TABLET | ORAL | Status: DC | PRN

## 2021-09-05 MED ORDER — CHLORHEXIDINE GLUCONATE 0.12 % MT SOLN
15.0000 mL | Freq: Once | OROMUCOSAL | Status: AC
  Administered 2021-09-05: 15 mL via OROMUCOSAL

## 2021-09-05 MED ORDER — ONDANSETRON HCL 4 MG/2ML IJ SOLN
INTRAMUSCULAR | Status: DC | PRN
  Administered 2021-09-05: 4 mg via INTRAVENOUS

## 2021-09-05 MED ORDER — METRONIDAZOLE 500 MG/100ML IV SOLN
500.0000 mg | Freq: Two times a day (BID) | INTRAVENOUS | Status: DC
Start: 2021-09-05 — End: 2021-09-05

## 2021-09-05 MED ORDER — FENTANYL CITRATE (PF) 100 MCG/2ML IJ SOLN
INTRAMUSCULAR | Status: AC
  Filled 2021-09-05: qty 2

## 2021-09-05 MED ORDER — SODIUM CHLORIDE 0.9 % IV SOLN
2.0000 g | INTRAVENOUS | Status: DC
  Administered 2021-09-05: 2 g via INTRAVENOUS
  Filled 2021-09-05: qty 20

## 2021-09-05 MED ORDER — OXYCODONE HCL 5 MG PO TABS: 5.0000 mg | ORAL_TABLET | Freq: Once | ORAL | Status: DC | PRN

## 2021-09-05 MED ORDER — LACTATED RINGERS IV BOLUS
500.0000 mL | Freq: Once | INTRAVENOUS | Status: AC
  Administered 2021-09-05: 500 mL via INTRAVENOUS

## 2021-09-05 MED ORDER — ALBUTEROL SULFATE HFA 108 (90 BASE) MCG/ACT IN AERS
INHALATION_SPRAY | RESPIRATORY_TRACT | Status: AC
  Filled 2021-09-05: qty 6.7

## 2021-09-05 MED ORDER — SUCCINYLCHOLINE CHLORIDE 200 MG/10ML IV SOSY
PREFILLED_SYRINGE | INTRAVENOUS | Status: DC | PRN
  Administered 2021-09-05: 160 mg via INTRAVENOUS

## 2021-09-05 MED ORDER — SIMETHICONE 80 MG PO CHEW
80.0000 mg | CHEWABLE_TABLET | Freq: Once | ORAL | Status: AC
  Administered 2021-09-05: 80 mg via ORAL
  Filled 2021-09-05: qty 1

## 2021-09-05 MED ORDER — FENTANYL CITRATE PF 50 MCG/ML IJ SOSY: 25.0000 ug | PREFILLED_SYRINGE | INTRAMUSCULAR | Status: DC | PRN

## 2021-09-05 MED ORDER — DEXAMETHASONE SODIUM PHOSPHATE 10 MG/ML IJ SOLN
INTRAMUSCULAR | Status: DC | PRN
  Administered 2021-09-05: 8 mg via INTRAVENOUS

## 2021-09-05 MED ORDER — ALBUTEROL SULFATE HFA 108 (90 BASE) MCG/ACT IN AERS
INHALATION_SPRAY | RESPIRATORY_TRACT | Status: DC | PRN
  Administered 2021-09-05: 2 via RESPIRATORY_TRACT

## 2021-09-05 MED ORDER — 0.9 % SODIUM CHLORIDE (POUR BTL) OPTIME
TOPICAL | Status: DC | PRN
  Administered 2021-09-05: 1000 mL

## 2021-09-05 MED ORDER — ONDANSETRON HCL 4 MG/2ML IJ SOLN: 4.0000 mg | Freq: Once | INTRAMUSCULAR | Status: DC | PRN

## 2021-09-05 MED ORDER — ONDANSETRON HCL 4 MG/2ML IJ SOLN
INTRAMUSCULAR | Status: AC
  Filled 2021-09-05: qty 2

## 2021-09-05 MED ORDER — FENTANYL CITRATE (PF) 100 MCG/2ML IJ SOLN
INTRAMUSCULAR | Status: DC | PRN
  Administered 2021-09-05: 100 ug via INTRAVENOUS

## 2021-09-05 MED ORDER — LIDOCAINE 2% (20 MG/ML) 5 ML SYRINGE
INTRAMUSCULAR | Status: DC | PRN
  Administered 2021-09-05: 100 mg via INTRAVENOUS

## 2021-09-05 MED ORDER — METOCLOPRAMIDE HCL 5 MG/ML IJ SOLN
INTRAMUSCULAR | Status: AC
  Administered 2021-09-05: 10 mg
  Filled 2021-09-05: qty 2

## 2021-09-05 MED ORDER — PROPOFOL 10 MG/ML IV BOLUS
INTRAVENOUS | Status: DC | PRN
  Administered 2021-09-05: 170 mg via INTRAVENOUS

## 2021-09-05 MED ORDER — IOHEXOL 300 MG/ML  SOLN
INTRAMUSCULAR | Status: DC | PRN
  Administered 2021-09-05: 16 mL via URETHRAL

## 2021-09-05 MED ORDER — IOHEXOL 9 MG/ML PO SOLN
ORAL | Status: AC
  Administered 2021-09-05: 500 mL
  Filled 2021-09-05: qty 1000

## 2021-09-05 MED ORDER — ACETAMINOPHEN 160 MG/5ML PO SOLN: 325.0000 mg | ORAL | Status: DC | PRN

## 2021-09-05 MED ORDER — NALOXONE HCL 0.4 MG/ML IJ SOLN
INTRAMUSCULAR | Status: DC | PRN
  Administered 2021-09-05 (×2): 120 ug via INTRAVENOUS

## 2021-09-05 MED ORDER — SODIUM CHLORIDE 0.9 % IR SOLN
Status: DC | PRN
  Administered 2021-09-05: 3000 mL

## 2021-09-05 MED ORDER — METOCLOPRAMIDE HCL 5 MG/ML IJ SOLN
10.0000 mg | Freq: Once | INTRAMUSCULAR | Status: AC
  Administered 2021-09-05: 10 mg via INTRAVENOUS

## 2021-09-05 MED ORDER — MEPERIDINE HCL 50 MG/ML IJ SOLN: 6.2500 mg | INTRAMUSCULAR | Status: DC | PRN

## 2021-09-05 MED ORDER — PHENYLEPHRINE 80 MCG/ML (10ML) SYRINGE FOR IV PUSH (FOR BLOOD PRESSURE SUPPORT)
PREFILLED_SYRINGE | INTRAVENOUS | Status: DC | PRN
  Administered 2021-09-05 (×2): 160 ug via INTRAVENOUS

## 2021-09-05 MED ORDER — DEXAMETHASONE SODIUM PHOSPHATE 10 MG/ML IJ SOLN
INTRAMUSCULAR | Status: AC
  Filled 2021-09-05: qty 1

## 2021-09-05 MED ORDER — OXYCODONE HCL 5 MG/5ML PO SOLN: 5.0000 mg | Freq: Once | ORAL | Status: DC | PRN

## 2021-09-05 SURGICAL SUPPLY — 25 items
BAG URO CATCHER STRL LF (MISCELLANEOUS) ×3 IMPLANT
CATH URETL OPEN 5X70 (CATHETERS) IMPLANT
CLOTH BEACON ORANGE TIMEOUT ST (SAFETY) ×3 IMPLANT
CONT SPEC DRY SEAL-RITE 20ML (COLLECTOR) ×1 IMPLANT
GLOVE BIOGEL PI IND STRL 7.0 (GLOVE) IMPLANT
GLOVE BIOGEL PI IND STRL 7.5 (GLOVE) IMPLANT
GLOVE BIOGEL PI IND STRL 8 (GLOVE) IMPLANT
GLOVE BIOGEL PI INDICATOR 7.0 (GLOVE) ×1
GLOVE BIOGEL PI INDICATOR 7.5 (GLOVE) ×1
GLOVE BIOGEL PI INDICATOR 8 (GLOVE) ×1
GLOVE SURG ORTHO 7.0 STRL STRW (GLOVE) ×2 IMPLANT
GLOVE SURG ORTHO 8.0 STRL STRW (GLOVE) ×1 IMPLANT
GOWN STRL REUS W/ TWL LRG LVL3 (GOWN DISPOSABLE) IMPLANT
GOWN STRL REUS W/ TWL XL LVL3 (GOWN DISPOSABLE) ×2 IMPLANT
GOWN STRL REUS W/TWL LRG LVL3 (GOWN DISPOSABLE) ×2
GOWN STRL REUS W/TWL XL LVL3 (GOWN DISPOSABLE) ×4
GUIDEWIRE STR DUAL SENSOR (WIRE) ×3 IMPLANT
GUIDEWIRE ZIPWRE .038 STRAIGHT (WIRE) IMPLANT
MANIFOLD NEPTUNE II (INSTRUMENTS) ×3 IMPLANT
PACK CYSTO (CUSTOM PROCEDURE TRAY) ×3 IMPLANT
STENT URET 6FRX24 CONTOUR (STENTS) ×1 IMPLANT
SYR 10ML LL (SYRINGE) ×3 IMPLANT
SYR 20ML LL LF (SYRINGE) ×1 IMPLANT
TUBING CONNECTING 10 (TUBING) ×3 IMPLANT
TUBING UROLOGY SET (TUBING) IMPLANT

## 2021-09-05 NOTE — Progress Notes (Signed)
[facility] notified to transfer this patient to [facility] long recovery room at 7pm

## 2021-09-05 NOTE — Progress Notes (Signed)
Report given to [place] at [facility] long reflecting patient's current status. [facility] on rout to pick up the patient.

## 2021-09-05 NOTE — Progress Notes (Signed)
Mobility Specialist Progress Note   09/05/21 1032  Mobility  Activity Dangled on edge of bed  Level of Assistance Minimal assist, patient does 75% or more  Assistive Device Front wheel walker  Activity Response Tolerated well  $Mobility charge 1 Mobility   Pre Mobility: 110 HR, 113/71 BP, 100% SpO2 on 2LO2  Received in bed slightly lethargic and on 2LO2 but agreeable to mobility. Pt requiring minA to get EOB d/t trunk weakness and dypsnea but reading 100% SpO2. Pt temporarily placed on RA and was able to maintain >95% SpO2. Practiced PLB for ~31mns for relief. PT arrived after pursed lip breathing and continued session.     JHolland FallingMobility Specialist MS [facility]#:  [phone] Rehab Office:  [phone]

## 2021-09-05 NOTE — Anesthesia Preprocedure Evaluation (Addendum)
Anesthesia Evaluation  Patient identified by MRN, date of birth, ID band Patient awake    Reviewed: Allergy & Precautions, H&P , NPO status , Patient's Chart, lab work & pertinent test results  Airway Mallampati: III  TM Distance: >3 FB Neck ROM: full    Dental no notable dental hx. (+) Teeth Intact, Dental Advisory Given   Pulmonary asthma , former smoker,  Vocal cord disorder   Pulmonary exam normal breath sounds clear to auscultation       Cardiovascular hypertension, Pt. on medications and Pt. on home beta blockers Normal cardiovascular exam Rhythm:regular Rate:Normal  palpitations   Neuro/Psych PSYCHIATRIC DISORDERS Anxiety Depression negative neurological ROS     GI/Hepatic negative GI ROS, GERD  ,(+)     substance abuse  alcohol use,   Endo/Other  negative endocrine ROSMorbid obesitygoiter  Renal/GU ARF and CRFRenal disease  negative genitourinary   Musculoskeletal  (+) Arthritis ,   Abdominal (+) + obese,   Peds  Hematology  (+) Blood dyscrasia, anemia ,   Anesthesia Other Findings   Reproductive/Obstetrics negative OB ROS                            Anesthesia Physical  Anesthesia Plan  ASA: 3 and emergent  Anesthesia Plan: General   Post-op Pain Management:    Induction: Intravenous and Cricoid pressure planned  PONV Risk Score and Plan: 2 and Ondansetron and Dexamethasone  Airway Management Planned: Oral ETT  Additional Equipment: None  Intra-op Plan:   Post-operative Plan: Extubation in OR  Informed Consent: I have reviewed the patients History and Physical, chart, labs and discussed the procedure including the risks, benefits and alternatives for the proposed anesthesia with the patient or authorized representative who has indicated his/her understanding and acceptance.       Plan Discussed with: CRNA and Surgeon  Anesthesia Plan Comments: (HPI: [name] is a 61 y.o. female with medical history significant of ETOH dependence; afib; HTN; HLD; and morbid obesity (BMI 77) presenting with ETOH withdrawal.  She reports that her husband is out of town.  She had a "little party" for Father's Day and it got out of hand.  She has been drinking 2 fifths a day.  She has been making "cosmetologists and jello shots."  She decided to stop cold [place] yesterday.  She binge drinks and then just stops periodically.  She has been having visual hallucinations (a rat running across the porch, a bird, spots).  She got very weak like she might pass out.  +shaky/nervous.  She denies h/o seizure.  She was hospitalized for ETOH ER Course:  ETOH withdrawal.   Has had 6 mg Ativan, still with visual hallucinations.withdrawal for a couple of days the last time.  Currently, she is still shaky.  She is still seeing things.)       Anesthesia Quick Evaluation

## 2021-09-05 NOTE — Anesthesia Postprocedure Evaluation (Signed)
Anesthesia Post Note  Patient: [name]  Procedure(s) Performed: CYSTOSCOPY WITH RETROGRADE PYELOGRAM/URETERAL STENT PLACEMENT (Right: Ureter)     Patient location during evaluation: PACU Anesthesia Type: General Level of consciousness: awake and alert Pain management: pain level controlled Vital Signs Assessment: post-procedure vital signs reviewed and stable Respiratory status: spontaneous breathing, nonlabored ventilation, respiratory function stable and patient connected to nasal cannula oxygen Cardiovascular status: blood pressure returned to baseline and stable Postop Assessment: no apparent nausea or vomiting Anesthetic complications: no   No notable events documented.  Last Vitals:  Vitals:   09/05/21 2000 09/05/21 2015  BP: 136/80 (!) 144/93  Pulse: (!) 110 (!) 109  Resp: (!) 24 20  Temp:  37.1 C  SpO2: 96% 95%    Last Pain:  Vitals:   09/05/21 2000  TempSrc:   PainSc: 0-No pain                 Asberry Lascola

## 2021-09-05 NOTE — Plan of Care (Signed)

## 2021-09-05 NOTE — Progress Notes (Signed)
[facility]                                                                              [name], is a 61 y.o. female, DOB - [birth year], PZW:[id number] Admit date - 09/02/2021    Outpatient Primary MD for the patient is Biagio Borg, MD  LOS - 3  days  Chief Complaint  Patient presents with   Alcohol Problem   Tremors   Hallucinations       Brief summary   Patient is a 61 year old female with known colovaginal fistula in the past, prior bladder and colonic abscesses with pelvic drainage in 2018, A-fib on Eliquis, mild intermittent asthma, HTN, HLD, alcohol abuse has been drinking heavily recently Mylan-Fingolimod, and the resulting tremors, visual hallucinations.   Assessment & Plan    Principal Problem:   Alcohol dependence with withdrawal delirium ([place]) -Much improved, CIWA 0 this morning -For now continue IV Ativan and scheduled Valium  Active Problems: Severe sepsis with acute kidney injury with leukocytosis, UTI/pyelonephritis/ obstructive uropathy  E. coli bacteremia -Patient meets severe sepsis criteria due to tachycardia, hypotension, fevers, acute kidney injury, source likely due to infected obstructed kidney stone -Baseline creatinine 1.0, presented with a creatinine of 3.58-> 5.09-> 7.1-> 8.6 -Blood cultures positive for E. coli, patient was placed on Rocephin, increase to 2 g IV daily today -Placed Foley catheter on 7/26, continue aggressive IV fluid hydration -Renal ultrasound showed mild right hydronephrosis, possible trace left hydronephrosis.  - CT abdomen and pelvis showed 6 mm stone in the proximal right ureter with mild upstream hydronephrosis.  Discussed with urology on-call, Dr. Anselmo Pickler, recommended transfer to [facility] for urgent stent placement -Discussed with nephrology, Dr. Vanetta Mulders, nephrology will follow patient at [facility] likely this is reversible AKI secondary to obstructive uropathy, continue current  management. -Per urology, okay to continue eliquis  Tachycardia, paroxysmal atrial fibrillation -Continue aggressive IV fluid hydration, continue Toprol-XL -Continue eliquis    Essential hypertension  -BP currently soft, continue Toprol-XL    Hyperlipidemia Continue Lipitor    History of pulmonary embolism -Continue eliquis  Morbid obesity Estimated body mass index is 56.24 kg/m as calculated from the following:   Height as of this encounter: '5\' 1"'$  (1.549 m).   Weight as of this encounter: 135 kg.  Code Status: Full CODE STATUS DVT Prophylaxis:   apixaban (ELIQUIS) tablet 5 mg   Level of Care: Level of care: Progressive Family Communication: Updated patient and her husband in detail, agreed with the plan   Disposition Plan:      Remains inpatient appropriate:    Procedures:  None  Consultants:   Nephrology Urology  Antimicrobials:   Anti-infectives (From admission, onward)    Start     Dose/Rate Route Frequency Ordered Stop   09/05/21 2000  cefTRIAXone (ROCEPHIN) 2 g in sodium chloride 0.9 % 100 mL IVPB        2 g 200 mL/hr over 30 Minutes Intravenous Every 24 hours 09/05/21 0614     09/05/21 0715  metroNIDAZOLE (FLAGYL) IVPB 500 mg  Status:  Discontinued  500 mg 100 mL/hr over 60 Minutes Intravenous Every 12 hours 09/05/21 0616 09/05/21 0618   09/04/21 2000  cefTRIAXone (ROCEPHIN) 1 g in sodium chloride 0.9 % 100 mL IVPB  Status:  Discontinued        1 g 200 mL/hr over 30 Minutes Intravenous Every 24 hours 09/04/21 1920 09/05/21 5809          Medications  apixaban  5 mg Oral BID   atorvastatin  20 mg Oral Daily   Chlorhexidine Gluconate Cloth  6 each Topical Daily   diazepam  5 mg Oral Q6H   docusate sodium  100 mg Oral BID   folic acid  1 mg Oral Daily   LORazepam  0-4 mg Intravenous Q8H   metoprolol succinate  25 mg Oral Daily   multivitamin with minerals  1 tablet Oral Daily   pantoprazole  40 mg Oral Daily   sodium chloride flush  3  mL Intravenous Q12H   thiamine  100 mg Oral Daily      Subjective:   [name] was seen and examined today.  Still tachycardiac, spiking fevers, borderline BP.  No tremulousness or hallucinations today. Objective:   Vitals:   09/04/21 2052 09/04/21 2100 09/05/21 0000 09/05/21 0853  BP: (!) 107/58 (!) 107/58 (!) 110/59 109/70  Pulse: (!) 130 (!) 130 (!) 130 100  Resp: 18   20  Temp: 100.2 F (37.9 C) 100.2 F (37.9 C) 100.2 F (37.9 C) 98 F (36.7 C)  TempSrc: Oral Oral Oral Oral  SpO2: 97% 97% 98% 98%  Weight:      Height:        Intake/Output Summary (Last 24 hours) at 09/05/2021 1313 Last data filed at 09/05/2021 0500 Gross per 24 hour  Intake 2523 ml  Output 525 ml  Net 1998 ml     Wt Readings from Last 3 Encounters:  09/02/21 135 kg  04/17/21 135.4 kg  03/31/21 129.3 kg   Physical Exam General: Alert and oriented x 3, NAD Cardiovascular: S1 S2 clear, RRR.  Tachycardia Respiratory: CTAB, no wheezing, rales or rhonchi Gastrointestinal: Soft, nontender, nondistended, NBS Ext: no pedal edema bilaterally Neuro: no new deficits Skin: No rashes Psych: Normal affect and demeanor, alert and oriented x3    Data Reviewed:  I have personally reviewed following labs    CBC Lab Results  Component Value Date   WBC 28.2 (H) 09/04/2021   RBC 3.73 (L) 09/04/2021   HGB 11.2 (L) 09/04/2021   HCT 34.5 (L) 09/04/2021   MCV 92.5 09/04/2021   MCH 30.0 09/04/2021   PLT 188 09/04/2021   MCHC 32.5 09/04/2021   RDW 14.8 09/04/2021   LYMPHSABS 0.7 09/04/2021   MONOABS 0.5 09/04/2021   EOSABS 0.0 09/04/2021   BASOSABS 0.1 98/33/8250     Last metabolic panel Lab Results  Component Value Date   NA 133 (L) 09/05/2021   K 5.1 09/05/2021   CL 100 09/05/2021   CO2 22 09/05/2021   BUN 55 (H) 09/05/2021   CREATININE 8.64 (H) 09/05/2021   GLUCOSE 96 09/05/2021   GFRNONAA 5 (L) 09/05/2021   GFRAA >60 09/29/2019   CALCIUM 8.9 09/05/2021   PHOS 4.1 09/05/2021    PROT 6.6 09/04/2021   ALBUMIN 2.7 (L) 09/05/2021   BILITOT 0.7 09/04/2021   ALKPHOS 119 09/04/2021   AST 58 (H) 09/04/2021   ALT 46 (H) 09/04/2021   ANIONGAP 11 09/05/2021       Seaborn Nakama M.D. [facility]  09/05/2021, 1:13 PM  Available via Epic secure chat 7am-7pm After 7 pm, please refer to night coverage provider listed on amion.

## 2021-09-05 NOTE — Transfer of Care (Signed)
Immediate Anesthesia Transfer of Care Note  Patient: [name]  Procedure(s) Performed: CYSTOSCOPY WITH RETROGRADE PYELOGRAM/URETERAL STENT PLACEMENT (Right: Ureter)  Patient Location: PACU  Anesthesia Type:General  Level of Consciousness: awake, drowsy and patient cooperative  Airway & Oxygen Therapy: Patient Spontanous Breathing and Patient connected to face mask oxygen  Post-op Assessment: Report given to RN and Post -op Vital signs reviewed and stable  Post vital signs: Reviewed and stable  Last Vitals:  Vitals Value Taken Time  BP 163/94 09/05/21 1938  Temp 36.8 C 09/05/21 1938  Pulse 110 09/05/21 1941  Resp 26 09/05/21 1941  SpO2 96 % 09/05/21 1941  Vitals shown include unvalidated device data.  Last Pain:  Vitals:   09/05/21 1736  TempSrc: Oral  PainSc: 0-No pain      Patients Stated Pain Goal: 1 (77/37/36 6815)  Complications: No notable events documented.

## 2021-09-05 NOTE — Plan of Care (Signed)

## 2021-09-05 NOTE — Consult Note (Addendum)
Urology Consult   Physician requesting consult: Estill Cotta, MD  Reason for consult: Obstructing right ureteral stone, sepsis  History of Present Illness: [name] is a 61 y.o. female who was admitted on 09/02/2021 with alcohol withdrawal.  She also had severe sepsis with tachycardia, hypotension and fevers.  This was suspected be due to a UTI and pyelonephritis.  Her creatinine was baseline 1.0 and has increased to 8.6.  She underwent CT A/P 09/05/2021 that demonstrated a 6 mm stone in the proximal right ureter with mild upstream hydronephrosis.  She also has nonobstructing bilateral stones measuring 2-3 mm in both kidneys.  She does have a prior history of urolithiasis and has passed stones previously.  She has a history of a bladder wall diverticular abscess in 07/2016 that was managed with an interventional radiology drain.  CT A/P 03/2017 with continued improvement and likely residual scarring.  She was followed by Dr. Tresa Moore at that time.  CT A/P 08/2021 with evidence of a chronic colovaginal/colovesicular fistula.  It was noted to be indeterminate whether the fistula was patent.  Patient denies any symptoms related to this and denies urinary incontinence per vagina. She denies fecaluria or pneumaturia.  Past Medical History:  Diagnosis Date   Abscess of bladder 07/28/2016   Alcohol dependence ([place])    Allergic rhinitis 12/06/2013   Asthma 03/16/2015   Atrial flutter with rapid ventricular response ([place]) 04/01/2020   COLONIC POLYPS, HX OF 04/05/2010   DIVERTICULITIS, HX OF 04/05/2010   DJD (degenerative joint disease)    right knee, mot to severe   GERD (gastroesophageal reflux disease)    no meds   Heart murmur    hx of    Hyperlipidemia    Hypertension    Impaired glucose tolerance 12/06/2013   Morbid obesity with BMI of 50.0-59.9, adult [facility])     Past Surgical History:  Procedure Laterality Date   ABDOMINAL HYSTERECTOMY  age 84   fibroids   COLONOSCOPY WITH PROPOFOL N/A  07/25/2016   Procedure: COLONOSCOPY WITH PROPOFOL;  Surgeon: Carol Ada, MD;  Location: [facility];  Service: Endoscopy;  Laterality: N/A;   colonscopy     x 2   IR RADIOLOGIST EVAL & MGMT  08/12/2016   IR RADIOLOGIST EVAL & MGMT  08/21/2016   KNEE ARTHROSCOPY     left    TOTAL KNEE ARTHROPLASTY  07/29/2011   Procedure: TOTAL KNEE ARTHROPLASTY;  Surgeon: Mauri Pole, MD;  Location: [facility];  Service: Orthopedics;  Laterality: Right;   TOTAL KNEE ARTHROPLASTY Left 12/14/2012   Procedure: LEFT TOTAL KNEE ARTHROPLASTY;  Surgeon: Mauri Pole, MD;  Location: [facility];  Service: Orthopedics;  Laterality: Left;     Current Hospital Medications:  Home meds:  No current facility-administered medications on file prior to encounter.   Current Outpatient Medications on File Prior to Encounter  Medication Sig Dispense Refill   acetaminophen (TYLENOL) 500 MG tablet Take 250 mg by mouth every 6 (six) hours as needed for headache or moderate pain.     albuterol (VENTOLIN HFA) 108 (90 Base) MCG/ACT inhaler 2 puffs every 6 hrs as needed (Patient taking differently: Inhale 2 puffs into the lungs every 6 (six) hours as needed for wheezing or shortness of breath.) 8.5 each 11   alum & mag hydroxide-simeth (MAALOX/MYLANTA) 200-200-20 MG/5ML suspension Take 30 mLs by mouth every 6 (six) hours as needed for indigestion or heartburn.     apixaban (ELIQUIS) 5 MG TABS tablet Take 1 tablet (  5 mg total) by mouth 2 (two) times daily. 60 tablet 11   atorvastatin (LIPITOR) 20 MG tablet Take 1 tablet (20 mg total) by mouth daily. 90 tablet 3   HYDROcodone-acetaminophen (NORCO/VICODIN) 5-325 MG tablet Take 1 tablet by mouth every 4 (four) hours as needed for moderate pain. 20 tablet 0   losartan-hydrochlorothiazide (HYZAAR) 100-25 MG tablet TAKE 1 TABLET BY MOUTH EVERY DAY (Patient taking differently: Take 1 tablet by mouth daily.) 90 tablet 2   metoprolol succinate (TOPROL-XL) 25 MG 24 hr tablet Take 1 tablet (25 mg  total) by mouth daily. 30 tablet 11   ondansetron (ZOFRAN-ODT) 4 MG disintegrating tablet Take 1 tablet (4 mg total) by mouth every 8 (eight) hours as needed for nausea or vomiting. 20 tablet 0   Polyethyl Glycol-Propyl Glycol (SYSTANE OP) Place 1 drop into both eyes 2 (two) times daily as needed (dry eyes).     triamcinolone cream (KENALOG) 0.1 % APPLY TO AFFECTED AREA TWICE A DAY (Patient taking differently: Apply 1 Application topically 2 (two) times daily.) 30 g 1     Scheduled Meds:  [MAR Hold] apixaban  5 mg Oral BID   [MAR Hold] atorvastatin  20 mg Oral Daily   [MAR Hold] Chlorhexidine Gluconate Cloth  6 each Topical Daily   [MAR Hold] diazepam  5 mg Oral Q6H   [MAR Hold] docusate sodium  100 mg Oral BID   [MAR Hold] folic acid  1 mg Oral Daily   [MAR Hold] LORazepam  0-4 mg Intravenous Q8H   [MAR Hold] metoprolol succinate  25 mg Oral Daily   [MAR Hold] multivitamin with minerals  1 tablet Oral Daily   [MAR Hold] pantoprazole  40 mg Oral Daily   [MAR Hold] sodium chloride flush  3 mL Intravenous Q12H   [MAR Hold] thiamine  100 mg Oral Daily   Continuous Infusions:  [MAR Hold] cefTRIAXone (ROCEPHIN)  IV     lactated ringers 125 mL/hr at 09/05/21 1557   PRN Meds:.[MAR Hold] acetaminophen **OR** [MAR Hold] acetaminophen, [MAR Hold] albuterol, [MAR Hold] bisacodyl, [MAR Hold] hydrALAZINE, [MAR Hold] ondansetron **OR** [MAR Hold] ondansetron (ZOFRAN) IV, [MAR Hold] oxyCODONE, [MAR Hold] polyethylene glycol, [MAR Hold] Polyethyl Glycol-Propyl Glycol  Allergies:  Allergies  Allergen Reactions   Morphine Itching   Shrimp [Shellfish Allergy] Itching and Other (See Comments)    Tongue burns also   Tramadol Other (See Comments)    Caused confusion   Covid-19 (Mrna) Vaccine Hives   Diltiazem Hcl Itching    Pt with itching of the feet when bolus given   Other Other (See Comments)   Latex Rash    Family History  Problem Relation Age of Onset   Stroke Mother    COPD Father     Lymphoma Sister    Alcoholism Sister     Social History:  reports that she quit smoking about 33 years ago. Her smoking use included cigarettes. She has a 3.50 pack-year smoking history. She has never used smokeless tobacco. She reports current alcohol use. She reports that she does not currently use drugs after having used the following drugs: Marijuana.  ROS: A complete review of systems was performed.  All systems are negative except for pertinent findings as noted.  Physical Exam:  Vital signs in last 24 hours: Temp:  [98 F (36.7 C)-100.2 F (37.9 C)] 98.8 F (37.1 C) (07/27 1736) Pulse Rate:  [99-130] 99 (07/27 1736) Resp:  [18-24] 24 (07/27 1736) BP: (107-136)/(58-94) 109/85 (07/27 1736)  SpO2:  [97 %-98 %] 97 % (07/27 1736) FiO2 (%):  [94 %] 94 % (07/27 1050) Constitutional:  Alert and oriented, No acute distress Cardiovascular: Regular rate and rhythm Respiratory: Normal respiratory effort, Lungs clear bilaterally GI: Abdomen is soft, nontender, nondistended, no abdominal masses GU: No CVA tenderness, morbid obesity Neurologic: Grossly intact, no focal deficits Psychiatric: Normal mood and affect  Laboratory Data:  Recent Labs    09/03/21 0321 09/04/21 0704 09/04/21 2109  WBC 11.5* 20.8* 28.2*  HGB 11.4* 11.2* 11.2*  HCT 34.1* 34.0* 34.5*  PLT 231 215 188    Recent Labs    09/03/21 0321 09/04/21 0704 09/04/21 2109 09/05/21 1157  NA 137 135 132* 133*  K 4.5 4.5 5.2* 5.1  CL 102 102 100 100  GLUCOSE 108* 128* 134* 96  BUN 37* 46* 53* 55*  CALCIUM 8.4* 8.8* 8.7* 8.9  CREATININE 5.09* 7.14* 8.22* 8.64*     Results for orders placed or performed during the hospital encounter of 09/02/21 (from the past 24 hour(s))  Culture, blood (Routine X 2) w Reflex to ID Panel     Status: None (Preliminary result)   Collection Time: 09/04/21  9:09 PM   Specimen: BLOOD  Result Value Ref Range   Specimen Description BLOOD RIGHT ANTECUBITAL    Special Requests       BOTTLES DRAWN AEROBIC AND ANAEROBIC Blood Culture adequate volume   Culture      NO GROWTH < 12 HOURS Performed at [facility], [address], [place], [place] [address]    Report Status PENDING   Culture, blood (Routine X 2) w Reflex to ID Panel     Status: None (Preliminary result)   Collection Time: 09/04/21  9:09 PM   Specimen: BLOOD LEFT HAND  Result Value Ref Range   Specimen Description BLOOD LEFT HAND    Special Requests      BOTTLES DRAWN AEROBIC AND ANAEROBIC Blood Culture adequate volume   Culture  Setup Time      GRAM NEGATIVE RODS IN BOTH AEROBIC AND ANAEROBIC BOTTLES Organism ID to follow CRITICAL RESULT CALLED TO, READ BACK BY AND VERIFIED WITH: PHARMD AUSTIN PAYTES [id number] 1856 PM BY EC Performed at [facility], [place] [address], [place], [place] [address]    Culture GRAM NEGATIVE RODS    Report Status PENDING   CBC with Differential/Platelet     Status: Abnormal   Collection Time: 09/04/21  9:09 PM  Result Value Ref Range   WBC 28.2 (H) 4.0 - 10.5 K/uL   RBC 3.73 (L) 3.87 - 5.11 MIL/uL   Hemoglobin 11.2 (L) 12.0 - 15.0 g/dL   HCT 34.5 (L) 36.0 - 46.0 %   MCV 92.5 80.0 - 100.0 fL   MCH 30.0 26.0 - 34.0 pg   MCHC 32.5 30.0 - 36.0 g/dL   RDW 14.8 11.5 - 15.5 %   Platelets 188 150 - 400 K/uL   nRBC 0.1 0.0 - 0.2 %   Neutrophils Relative % 93 %   Neutro Abs 26.2 (H) 1.7 - 7.7 K/uL   Lymphocytes Relative 3 %   Lymphs Abs 0.7 0.7 - 4.0 K/uL   Monocytes Relative 2 %   Monocytes Absolute 0.5 0.1 - 1.0 K/uL   Eosinophils Relative 0 %   Eosinophils Absolute 0.0 0.0 - 0.5 K/uL   Basophils Relative 0 %   Basophils Absolute 0.1 0.0 - 0.1 K/uL   Immature Granulocytes 2 %   Abs  Immature Granulocytes 0.65 (H) 0.00 - 0.07 K/uL  Comprehensive metabolic panel     Status: Abnormal   Collection Time: 09/04/21  9:09 PM  Result Value Ref Range   Sodium 132 (L) 135 - 145 mmol/L   Potassium 5.2 (H) 3.5 - 5.1 mmol/L   Chloride 100 98 - 111 mmol/L   CO2 19 (L) 22  - 32 mmol/L   Glucose, Bld 134 (H) 70 - 99 mg/dL   BUN 53 (H) 6 - 20 mg/dL   Creatinine, Ser 8.22 (H) 0.44 - 1.00 mg/dL   Calcium 8.7 (L) 8.9 - 10.3 mg/dL   Total Protein 6.6 6.5 - 8.1 g/dL   Albumin 2.7 (L) 3.5 - 5.0 g/dL   AST 58 (H) 15 - 41 U/L   ALT 46 (H) 0 - 44 U/L   Alkaline Phosphatase 119 38 - 126 U/L   Total Bilirubin 0.7 0.3 - 1.2 mg/dL   GFR, Estimated 5 (L) >60 mL/min   Anion gap 13 5 - 15  Procalcitonin - Baseline     Status: None   Collection Time: 09/04/21  9:09 PM  Result Value Ref Range   Procalcitonin 57.93 ng/mL  Blood Culture ID Panel (Reflexed)     Status: Abnormal   Collection Time: 09/04/21  9:09 PM  Result Value Ref Range   Enterococcus faecalis NOT DETECTED NOT DETECTED   Enterococcus Faecium NOT DETECTED NOT DETECTED   Listeria monocytogenes NOT DETECTED NOT DETECTED   Staphylococcus species NOT DETECTED NOT DETECTED   Staphylococcus aureus (BCID) NOT DETECTED NOT DETECTED   Staphylococcus epidermidis NOT DETECTED NOT DETECTED   Staphylococcus lugdunensis NOT DETECTED NOT DETECTED   Streptococcus species NOT DETECTED NOT DETECTED   Streptococcus agalactiae NOT DETECTED NOT DETECTED   Streptococcus pneumoniae NOT DETECTED NOT DETECTED   Streptococcus pyogenes NOT DETECTED NOT DETECTED   A.calcoaceticus-baumannii NOT DETECTED NOT DETECTED   Bacteroides fragilis NOT DETECTED NOT DETECTED   Enterobacterales DETECTED (A) NOT DETECTED   Enterobacter cloacae complex NOT DETECTED NOT DETECTED   Escherichia coli DETECTED (A) NOT DETECTED   Klebsiella aerogenes NOT DETECTED NOT DETECTED   Klebsiella oxytoca NOT DETECTED NOT DETECTED   Klebsiella pneumoniae NOT DETECTED NOT DETECTED   Proteus species NOT DETECTED NOT DETECTED   Salmonella species NOT DETECTED NOT DETECTED   Serratia marcescens NOT DETECTED NOT DETECTED   Haemophilus influenzae NOT DETECTED NOT DETECTED   Neisseria meningitidis NOT DETECTED NOT DETECTED   Pseudomonas aeruginosa NOT DETECTED  NOT DETECTED   Stenotrophomonas maltophilia NOT DETECTED NOT DETECTED   Candida albicans NOT DETECTED NOT DETECTED   Candida auris NOT DETECTED NOT DETECTED   Candida glabrata NOT DETECTED NOT DETECTED   Candida krusei NOT DETECTED NOT DETECTED   Candida parapsilosis NOT DETECTED NOT DETECTED   Candida tropicalis NOT DETECTED NOT DETECTED   Cryptococcus neoformans/gattii NOT DETECTED NOT DETECTED   CTX-M ESBL NOT DETECTED NOT DETECTED   Carbapenem resistance IMP NOT DETECTED NOT DETECTED   Carbapenem resistance KPC NOT DETECTED NOT DETECTED   Carbapenem resistance NDM NOT DETECTED NOT DETECTED   Carbapenem resist OXA 48 LIKE NOT DETECTED NOT DETECTED   Carbapenem resistance VIM NOT DETECTED NOT DETECTED  CK     Status: Abnormal   Collection Time: 09/05/21  9:50 AM  Result Value Ref Range   Total CK 426 (H) 38 - 234 U/L  Renal function panel     Status: Abnormal   Collection Time: 09/05/21 11:57 AM  Result Value Ref  Range   Sodium 133 (L) 135 - 145 mmol/L   Potassium 5.1 3.5 - 5.1 mmol/L   Chloride 100 98 - 111 mmol/L   CO2 22 22 - 32 mmol/L   Glucose, Bld 96 70 - 99 mg/dL   BUN 55 (H) 6 - 20 mg/dL   Creatinine, Ser 8.64 (H) 0.44 - 1.00 mg/dL   Calcium 8.9 8.9 - 10.3 mg/dL   Phosphorus 4.1 2.5 - 4.6 mg/dL   Albumin 2.7 (L) 3.5 - 5.0 g/dL   GFR, Estimated 5 (L) >60 mL/min   Anion gap 11 5 - 15   Recent Results (from the past 240 hour(s))  Urine Culture     Status: Abnormal (Preliminary result)   Collection Time: 09/04/21  5:16 PM   Specimen: Urine, Clean Catch  Result Value Ref Range Status   Specimen Description URINE, CLEAN CATCH  Final   Special Requests NONE  Final   Culture (A)  Final    50,000 COLONIES/mL ESCHERICHIA COLI SUSCEPTIBILITIES TO FOLLOW Performed at [facility], [address] [place], [place] [address]    Report Status PENDING  Incomplete  Culture, blood (Routine X 2) w Reflex to ID Panel     Status: None (Preliminary result)   Collection  Time: 09/04/21  9:09 PM   Specimen: BLOOD  Result Value Ref Range Status   Specimen Description BLOOD RIGHT ANTECUBITAL  Final   Special Requests   Final    BOTTLES DRAWN AEROBIC AND ANAEROBIC Blood Culture adequate volume   Culture   Final    NO GROWTH < 12 HOURS Performed at [facility], [place] [address], [place], [place] [address]    Report Status PENDING  Incomplete  Culture, blood (Routine X 2) w Reflex to ID Panel     Status: None (Preliminary result)   Collection Time: 09/04/21  9:09 PM   Specimen: BLOOD LEFT HAND  Result Value Ref Range Status   Specimen Description BLOOD LEFT HAND  Final   Special Requests   Final    BOTTLES DRAWN AEROBIC AND ANAEROBIC Blood Culture adequate volume   Culture  Setup Time   Final    GRAM NEGATIVE RODS IN BOTH AEROBIC AND ANAEROBIC BOTTLES Organism ID to follow CRITICAL RESULT CALLED TO, READ BACK BY AND VERIFIED WITH: PHARMD AUSTIN PAYTES [id number] 1478 PM BY EC Performed at [facility], [place] [address] [place], [place] [address]    Culture GRAM NEGATIVE RODS  Final   Report Status PENDING  Incomplete  Blood Culture ID Panel (Reflexed)     Status: Abnormal   Collection Time: 09/04/21  9:09 PM  Result Value Ref Range Status   Enterococcus faecalis NOT DETECTED NOT DETECTED Final   Enterococcus Faecium NOT DETECTED NOT DETECTED Final   Listeria monocytogenes NOT DETECTED NOT DETECTED Final   Staphylococcus species NOT DETECTED NOT DETECTED Final   Staphylococcus aureus (BCID) NOT DETECTED NOT DETECTED Final   Staphylococcus epidermidis NOT DETECTED NOT DETECTED Final   Staphylococcus lugdunensis NOT DETECTED NOT DETECTED Final   Streptococcus species NOT DETECTED NOT DETECTED Final   Streptococcus agalactiae NOT DETECTED NOT DETECTED Final   Streptococcus pneumoniae NOT DETECTED NOT DETECTED Final   Streptococcus pyogenes NOT DETECTED NOT DETECTED Final   A.calcoaceticus-baumannii NOT DETECTED NOT DETECTED Final   Bacteroides  fragilis NOT DETECTED NOT DETECTED Final   Enterobacterales DETECTED (A) NOT DETECTED Final    Comment: Enterobacterales represent a large order of gram negative  bacteria, not a single organism.   Enterobacter cloacae complex NOT DETECTED NOT DETECTED Final   Escherichia coli DETECTED (A) NOT DETECTED Final    Comment: CRITICAL RESULT CALLED TO, READ BACK BY AND VERIFIED WITH: PHARMD AUSTIN PAYTES [id number] PM BY EC    Klebsiella aerogenes NOT DETECTED NOT DETECTED Final   Klebsiella oxytoca NOT DETECTED NOT DETECTED Final   Klebsiella pneumoniae NOT DETECTED NOT DETECTED Final   Proteus species NOT DETECTED NOT DETECTED Final   Salmonella species NOT DETECTED NOT DETECTED Final   Serratia marcescens NOT DETECTED NOT DETECTED Final   Haemophilus influenzae NOT DETECTED NOT DETECTED Final   Neisseria meningitidis NOT DETECTED NOT DETECTED Final   Pseudomonas aeruginosa NOT DETECTED NOT DETECTED Final   Stenotrophomonas maltophilia NOT DETECTED NOT DETECTED Final   Candida albicans NOT DETECTED NOT DETECTED Final   Candida auris NOT DETECTED NOT DETECTED Final   Candida glabrata NOT DETECTED NOT DETECTED Final   Candida krusei NOT DETECTED NOT DETECTED Final   Candida parapsilosis NOT DETECTED NOT DETECTED Final   Candida tropicalis NOT DETECTED NOT DETECTED Final   Cryptococcus neoformans/gattii NOT DETECTED NOT DETECTED Final   CTX-M ESBL NOT DETECTED NOT DETECTED Final   Carbapenem resistance IMP NOT DETECTED NOT DETECTED Final   Carbapenem resistance KPC NOT DETECTED NOT DETECTED Final   Carbapenem resistance NDM NOT DETECTED NOT DETECTED Final   Carbapenem resist OXA 48 LIKE NOT DETECTED NOT DETECTED Final   Carbapenem resistance VIM NOT DETECTED NOT DETECTED Final    Comment: Performed at [facility], [address], [place], [place] [address]    Renal Function: Recent Labs    09/02/21 0753 09/03/21 0321 09/04/21 0704 09/04/21 2109 09/05/21 1157  CREATININE  3.58* 5.09* 7.14* 8.22* 8.64*   Estimated Creatinine Clearance: 9 mL/min (A) (by C-G formula based on SCr of 8.64 mg/dL (H)).  Radiologic Imaging: CT ABDOMEN PELVIS WO CONTRAST  Result Date: 09/05/2021 CLINICAL DATA:  Sepsis, history of colovaginal fistula EXAM: CT ABDOMEN AND PELVIS WITHOUT CONTRAST TECHNIQUE: Multidetector CT imaging of the abdomen and pelvis was performed following the standard protocol without IV contrast. RADIATION DOSE REDUCTION: This exam was performed according to the departmental dose-optimization program which includes automated exposure control, adjustment of the mA and/or kV according to patient size and/or use of iterative reconstruction technique. COMPARISON:  CT 06/13/2021 FINDINGS: Lower chest: Mild cardiomegaly. Aortic valve calcifications. Mild aortic atherosclerotic calcification. No pericardial effusion. Respiratory motion obscures detail in the lungs. Unchanged subcarinal lymph node complex measuring 2.5 x 1.9 cm. Hepatobiliary: Marked hepatic steatosis. Multiple enhancing liver lesions unchanged from prior and likely representing hepatic hemangiomas. No biliary ductal dilation. No cholelithiasis. Pancreas: Unremarkable. No pancreatic ductal dilatation or surrounding inflammatory changes. Spleen: Normal in size without focal abnormality. Adrenals/Urinary Tract: Adrenal glands are unremarkable. Nonobstructing calyceal stones measuring 2-3 mm in both kidneys. New 6 mm stone in the proximal right ureter mild upstream hydronephrosis and hydroureter slightly increased from 06/13/2021. Previously 2 mm stone at the right UVJ and 1.5 cm stone at the left UPJ are no longer visualized. The evaluation for pyelonephritis is limited without IV contrast. No significant perinephric stranding. The bladder is decompressed about a Foley catheter. Locules of gas in the bladder likely related to Foley catheter placement. Stomach/Bowel: Stomach is within normal limits. The appendix is normal.  No evidence of bowel wall thickening, distention, or inflammatory changes. Colonic diverticulosis without diverticulitis. The sigmoid colon again appears tethered along the left aspect of  the bladder near the tract of the prior colovaginal fistula. It remains indeterminate whether the fistula is patent. Vascular/Lymphatic: Aortic atherosclerotic calcification. No enlarged abdominal or pelvic lymph nodes. Reproductive: Hysterectomy. Bandlike soft tissue thickening again extends from the upper left vaginal cuff to the left pelvic sidewall and again abuts the sigmoid colon (circa image 60 on series 6). Other: No free intraperitoneal fluid or air. Musculoskeletal: Advanced multilevel bridging anterior osteophytes of the thoracic spine with ankylosis compatible with DISH. IMPRESSION: 1. 6 mm stone in the proximal right ureter with mild upstream hydro nephrosis. Evaluation for pyelonephritis is limited without IV contrast. 2. Interval placement of a Foley catheter within the decompressed bladder. Gas within the bladder is presumed due to Foley catheter placement. However there is unchanged appearance of the chronic colovaginal/colovesical fistula. There remains indeterminate within the fistula is patent. 3. Enlarged severely steatotic liver.  Stable hemangiomas. Electronically Signed   By: Placido Sou M.D.   On: 09/05/2021 11:52   US RENAL  Result Date: 09/04/2021 CLINICAL DATA:  [id number].  Acute kidney injury EXAM: RENAL / URINARY TRACT ULTRASOUND COMPLETE COMPARISON:  CT abdomen pelvis 06/13/2021 FINDINGS: Right Kidney: Renal measurements: 13.7 x 7 x 5.9 cm = volume: 296 mL. Echogenicity within normal limits. No mass. Mild hydronephrosis visualized. Left Kidney: Renal measurements: 11.1 x 6 x 5.3 cm = volume: 184 mL. Echogenicity within normal limits. No mass. Possible trace hydronephrosis visualized. Urinary bladder: Not visualized. Other: None. IMPRESSION: 1. Mild right hydronephrosis. 2. Possible trace left  hydronephrosis. 3. Urinary bladder not visualized. Electronically Signed   By: Iven Finn M.D.   On: 09/04/2021 19:09   DG CHEST PORT 1 VIEW  Result Date: 09/04/2021 CLINICAL DATA:  Shortness of breath EXAM: PORTABLE CHEST 1 VIEW COMPARISON:  05/07/2021 FINDINGS: Transverse diameter heart is increased. Central pulmonary vessels are prominent. There are no signs of alveolar pulmonary edema or new focal infiltrates. Lateral CP angles are indistinct. There is no pneumothorax. IMPRESSION: Cardiomegaly. Central pulmonary vessels are prominent suggesting CHF. There are no signs of alveolar pulmonary edema or focal pulmonary consolidation. Lateral costophrenic angles are indistinct which may be related to chest wall attenuation or pleural thickening or small effusions. Electronically Signed   By: Elmer Picker M.D.   On: 09/04/2021 18:05    I independently reviewed the above imaging studies.  Impression/Recommendation: Obstructing right ureteral stone with pyelonephritis.  CT A/P 09/05/2021 with 6 mm stone with mild right hydronephrosis.  Previously hospitalized over the past several days with hypotension, tachycardia, persistent leukocytosis and AKI.  Bacteremic with Enterobacterales.  Urine culture with E. coli. Acute renal failure: Creatinine 8.64 at time of consultation from baseline of 1.0 Alcohol dependence with withdrawal delirium Paroxysmal atrial fibrillation History of pulmonary embolism on Eliquis Past medical history of hypertension and hyperlipidemia Morbid obesity  -I reviewed her CT findings and clinical course thus far this hospitalization now with evidence of obstructing right ureteral stone, bacteremia and urinary tract infection.  I recommend cystoscopy with right ureteral stent placement.  We discussed that the stone will be managed definitively in the outpatient setting.  We will plan to trend creatinine following procedure.  Discussed risk and benefits.  She elects to  proceed.  We will proceed emergently given her clinical picture of persistent sepsis with bacteremia.  -The risks, benefits and alternatives of cystoscopy with right JJ stent placement was discussed with the patient.  Risks include, but are not limited to: bleeding, urinary tract infection, ureteral injury, ureteral stricture disease,  chronic pain, urinary symptoms, bladder injury, stent migration, the need for nephrostomy tube placement, MI, CVA, DVT, PE and the inherent risks with general anesthesia.  The patient voices understanding and wishes to proceed.    Matt R. Dajon Lazar MD 09/05/2021, [facility]  Pager: [phone]   CC: Estill Cotta, MD

## 2021-09-05 NOTE — Anesthesia Procedure Notes (Signed)
Procedure Name: Intubation Date/Time: 09/05/2021 6:41 PM  Performed by: Raenette Rover, CRNAPre-anesthesia Checklist: Patient identified, Emergency Drugs available, Suction available and Patient being monitored Patient Re-evaluated:Patient Re-evaluated prior to induction Oxygen Delivery Method: Circle system utilized Preoxygenation: Pre-oxygenation with 100% oxygen Induction Type: IV induction, Rapid sequence and Cricoid Pressure applied Laryngoscope Size: Glidescope and 3 Grade View: Grade I Tube type: Oral Tube size: 7.0 mm Number of attempts: 1 Airway Equipment and Method: Stylet and Video-laryngoscopy Placement Confirmation: ETT inserted through vocal cords under direct vision, positive ETCO2 and breath sounds checked- equal and bilateral Secured at: 23 cm Tube secured with: Tape Dental Injury: Teeth and Oropharynx as per pre-operative assessment

## 2021-09-05 NOTE — Progress Notes (Signed)
Full consult to follow.  Admitted on 7/24 with AKI, sepsis. Today, she was found to have obstructing right 45m proximal ureteral stone. To OR today for right stent placement. Unfortunately she had a full lunch at noon and case unable to go until 8:30PM.  [facility]  Pager: [phone]

## 2021-09-05 NOTE — Progress Notes (Signed)
Subjective:  BP soft and tachycardic overnight-  renal u/s showing normal sized kidneys -  no stones -  possibly mild and trace hydro-  uop at least 525 since foley-  blood tinged urine -  U/A negative for protein 6-10 RBC/HPF-  procalcitonin 58 and WBC climbing-  has been put on rocephin -  looking for source of infection-  getting belly CT.  Pt alert sitting on side of bed taking meds  Objective Vital signs in last 24 hours: Vitals:   09/04/21 2052 09/04/21 2100 09/05/21 0000 09/05/21 0853  BP: (!) 107/58 (!) 107/58 (!) 110/59 109/70  Pulse: (!) 130 (!) 130 (!) 130 (!) 109  Resp: 18     Temp: 100.2 F (37.9 C) 100.2 F (37.9 C) 100.2 F (37.9 C)   TempSrc: Oral Oral Oral   SpO2: 97% 97% 98%   Weight:      Height:       Weight change:   Intake/Output Summary (Last 24 hours) at 09/05/2021 0857 Last data filed at 09/05/2021 0500 Gross per 24 hour  Intake 2763 ml  Output 525 ml  Net 2238 ml    Assessment/ Plan: Pt is a 61 y.o. yo female who was admitted on 09/02/2021 with ETOH withdrawal-  found to have AKI   Assessment/Plan  AKI: this may be a double hit from obstructive uropathy (detected 06/2021) and hypovolemia and hemodynamics in the setting of EtOH withdrawal and possible sepsis previously on ARB.  Renal function normal in March of 2023-  crt 1.76 in May in the setting of nephrolithiasis -  then 3.95 on presentation-  worsening since  - IVFs given soft BP and tachycardia - Foley placement -  renal US did not show overwhelming hydro and no stones-  urine shows gross hematuria but only 6-10 RB/ HPF-  will check CK to look for rhabdo Renal function worsening but her AKI was diagnosed late so could be the reason or she has something more ominous-  checking more labs at noon and then in AM  - just to be complete will send off serologies  No indication for RRT at this time    2.  Alcohol withdrawal             - on CIWA protocol   3.  Leukocytosis/ sepsis:             -  getting CXR, UA and culture             - procalcitonin, blood cultures             - antibiotics as appropriate per primary-  on rocephin and searching for source   4.  Afib             - on Eliquis and metoprolol 5. Hyperkalemia -  mild-  given lokelma and LR to help correct acidosis     Louis Meckel    Labs: Basic Metabolic Panel: Recent Labs  Lab 09/03/21 0321 09/04/21 0704 09/04/21 2109  NA 137 135 132*  K 4.5 4.5 5.2*  CL 102 102 100  CO2 24 21* 19*  GLUCOSE 108* 128* 134*  BUN 37* 46* 53*  CREATININE 5.09* 7.14* 8.22*  CALCIUM 8.4* 8.8* 8.7*   Liver Function Tests: Recent Labs  Lab 09/02/21 0753 09/04/21 2109  AST 67* 58*  ALT 58* 46*  ALKPHOS 129* 119  BILITOT 0.5 0.7  PROT 7.3 6.6  ALBUMIN 3.0* 2.7*   Recent Labs  Lab 09/02/21 0753  LIPASE 24   No results for input(s): "AMMONIA" in the last 168 hours. CBC: Recent Labs  Lab 09/02/21 0753 09/03/21 0321 09/04/21 0704 09/04/21 2109  WBC 13.5* 11.5* 20.8* 28.2*  NEUTROABS  --   --   --  26.2*  HGB 12.9 11.4* 11.2* 11.2*  HCT 39.5 34.1* 34.0* 34.5*  MCV 91.2 91.2 90.9 92.5  PLT 270 231 215 188   Cardiac Enzymes: No results for input(s): "CKTOTAL", "CKMB", "CKMBINDEX", "TROPONINI" in the last 168 hours. CBG: No results for input(s): "GLUCAP" in the last 168 hours.  Iron Studies: No results for input(s): "IRON", "TIBC", "TRANSFERRIN", "FERRITIN" in the last 72 hours. Studies/Results: US RENAL  Result Date: 09/04/2021 CLINICAL DATA:  [id number].  Acute kidney injury EXAM: RENAL / URINARY TRACT ULTRASOUND COMPLETE COMPARISON:  CT abdomen pelvis 06/13/2021 FINDINGS: Right Kidney: Renal measurements: 13.7 x 7 x 5.9 cm = volume: 296 mL. Echogenicity within normal limits. No mass. Mild hydronephrosis visualized. Left Kidney: Renal measurements: 11.1 x 6 x 5.3 cm = volume: 184 mL. Echogenicity within normal limits. No mass. Possible trace hydronephrosis visualized. Urinary bladder: Not  visualized. Other: None. IMPRESSION: 1. Mild right hydronephrosis. 2. Possible trace left hydronephrosis. 3. Urinary bladder not visualized. Electronically Signed   By: Iven Finn M.D.   On: 09/04/2021 19:09   DG CHEST PORT 1 VIEW  Result Date: 09/04/2021 CLINICAL DATA:  Shortness of breath EXAM: PORTABLE CHEST 1 VIEW COMPARISON:  05/07/2021 FINDINGS: Transverse diameter heart is increased. Central pulmonary vessels are prominent. There are no signs of alveolar pulmonary edema or new focal infiltrates. Lateral CP angles are indistinct. There is no pneumothorax. IMPRESSION: Cardiomegaly. Central pulmonary vessels are prominent suggesting CHF. There are no signs of alveolar pulmonary edema or focal pulmonary consolidation. Lateral costophrenic angles are indistinct which may be related to chest wall attenuation or pleural thickening or small effusions. Electronically Signed   By: Elmer Picker M.D.   On: 09/04/2021 18:05   Medications: Infusions:  cefTRIAXone (ROCEPHIN)  IV     lactated ringers 125 mL/hr at 09/04/21 1738    Scheduled Medications:  apixaban  5 mg Oral BID   atorvastatin  20 mg Oral Daily   Chlorhexidine Gluconate Cloth  6 each Topical Daily   diazepam  5 mg Oral Q6H   docusate sodium  100 mg Oral BID   folic acid  1 mg Oral Daily   iohexol       LORazepam  0-4 mg Intravenous Q8H   metoprolol succinate  25 mg Oral Daily   multivitamin with minerals  1 tablet Oral Daily   pantoprazole  40 mg Oral Daily   sodium chloride flush  3 mL Intravenous Q12H   thiamine  100 mg Oral Daily    have reviewed scheduled and prn medications.  Physical Exam: General: obese, sitting on side of bed-  a little confusion  Heart: tachy Lungs: poor effort-   Abdomen: obese, non tender Extremities: trace edema to LEs    09/05/2021,8:57 AM  LOS: 3 days

## 2021-09-05 NOTE — Op Note (Signed)
Operative Note  Preoperative diagnosis:  1.  Obstructing right ureteral stone with UTI, sepsis  Postoperative diagnosis: 1.  Obstructing right ureteral stone with UTI, sepsis  Procedure(s): 1.  Cystoscopy 2. Right retrograde pyelogram with interpretation 3. Right ureteral stent placement 4. Fluoroscopy <1 hour with intraoperative interpretation  Surgeon: Rexene Alberts, MD  Assistants:  None  Anesthesia:  General  Complications:  None  EBL:  Minimal  Specimens: 1. Right renal pelvis urine culture  Drains/Catheters: 1.  Right 6Fr x 26cm ureteral stent  Intraoperative findings:   Cystoscopy demonstrated a 2x2cm area of erythema, edema on the posterior aspect of the bladder. No evidence of patent fistula, however likely location of chronic colovesicular/diverticular abcess location that previously underwent drainage in 2019. No evidence of fecal material in bladder. No suspicion of bladder cancer. Right retrograde pyelogram demonstrated severe right hydronephrosis. Successful right ureteral stent placement with curl in the renal pelvis and bladder respectively.  Indication:  [name] is a 61 y.o. female with obstructing right proximal ureteral stone with sign of UTI, sepsis, bacteremia and acute renal failure after reviewing the management options for treatment, she elected to proceed with the above surgical procedure(s). We have discussed the potential benefits and risks of the procedure, side effects of the proposed treatment, the likelihood of the patient achieving the goals of the procedure, and any potential problems that might occur during the procedure or recuperation. Informed consent has been obtained.  Description of procedure: The patient was taken to the operating room and general anesthesia was induced.  The patient was placed in the dorsal lithotomy position, prepped and draped in the usual sterile fashion, and preoperative antibiotics were administered. A  preoperative time-out was performed.   Cystourethroscopy was performed.  The patient's urethra was examined and was normal. She did have an area of increased erythema and edema on the posterior aspect of the bladder with no evidence of patent fistula. The bladder was then systematically examined in its entirety. There was no evidence or suspicion of cancer.  Attention then turned to the right ureteral orifice. A 0.038 zip wire was passed through the right orifice and over the wire a 5 Fr open ended catheter was inserted and passed up to the level of the renal pelvis. Aspirate was obtained and sent off as right renal pelvis urine for culture. Urine was cloudy with evidence of UTI. Omnipaque contrast was injected through the ureteral catheter and a retrograde pyelogram was performed with findings as dictated above. The wire was then replaced and the open ended catheter was removed.   A 6Fr x 24cm ureteral stent was advance over the wire. The stent was positioned appropriately under fluoroscopic and cystoscopic guidance.  The wire was then removed with an adequate stent curl noted in the renal pelvis as well as in the bladder.  The bladder was then emptied and the procedure ended.  The patient appeared to tolerate the procedure well and without complications.  The patient was able to be awakened and transferred to the recovery unit in satisfactory condition.   Plan:  Leave stent in place. Will arrange for f/u for definitive treatment of stone.   Matt R[facility]  Pager: [phone]

## 2021-09-05 NOTE — Progress Notes (Signed)
Physical Therapy Treatment Patient Details Name: [name] MRN: [id number] DOB: [birth year] Today's Date: 09/05/2021   History of Present Illness Pt is a 61 y/o female admitted secondary to alcohol withdrawal and hallucinations. PMH includes hypertension, alcohol use disorder, atrial fibrillation.    PT Comments    Received pt sitting EOB with mobility tech; PT took over with care. Pt performed all transfers with RW and min guard today due to feeling "out of it" from medications. Pt only ambulated 91f with RW and min guard today and required frequent standing rest breaks due to SOB. Pt on RA with O2 sats dropping to 90% but pt able to quickly recover to >93% with pursed lip breathing. Pt able to void on commode and perform hygiene management with supervision. Pt left sitting EOB ordering dinner with transport arriving to take pt to CT. Acute PT to cont to follow.    Recommendations for follow up therapy are one component of a multi-disciplinary discharge planning process, led by the attending physician.  Recommendations may be updated based on patient status, additional functional criteria and insurance authorization.  Follow Up Recommendations  Home health PT     Assistance Recommended at Discharge Intermittent Supervision/Assistance  Patient can return home with the following A little help with walking and/or transfers;A little help with bathing/dressing/bathroom;Assistance with cooking/housework;Assist for transportation;Direct supervision/assist for financial management;Direct supervision/assist for medications management;Help with stairs or ramp for entrance   Equipment Recommendations  Rolling walker (2 wheels)    Recommendations for Other Services       Precautions / Restrictions Precautions Precautions: Fall Precaution Comments: monitor O2 Restrictions Weight Bearing Restrictions: No     Mobility  Bed Mobility               General bed mobility comments:  sitting EOB upon entry and exit Patient Response: Cooperative  Transfers Overall transfer level: Needs assistance Equipment used: Rolling walker (2 wheels) Transfers: Sit to/from Stand Sit to Stand: Min guard                Ambulation/Gait Ambulation/Gait assistance: Min guard Gait Distance (Feet): 60 Feet Assistive device: Rolling walker (2 wheels) Gait Pattern/deviations: Step-through pattern, Decreased step length - right, Decreased step length - left, Decreased stride length, Trunk flexed, Narrow base of support Gait velocity: Decreased Gait velocity interpretation: <1.31 ft/sec, indicative of household ambulator   General Gait Details: pt required frequent standing rest breaks due to SOB. Pt reported feeling "out of it" from medications but still motivated to try her best.   Stairs             Wheelchair Mobility    Modified Rankin (Stroke Patients Only)       Balance Overall balance assessment: Needs assistance Sitting-balance support: No upper extremity supported, Feet supported Sitting balance-Leahy Scale: Good     Standing balance support: Bilateral upper extremity supported, During functional activity, Reliant on assistive device for balance (RW) Standing balance-Leahy Scale: Fair Standing balance comment: pt able to maintain static standing balance with supervision but required min guard today for dynamic standing balance for safety as pt reported feeling "out of it"                            Cognition Arousal/Alertness: Awake/alert Behavior During Therapy: WFL for tasks assessed/performed Overall Cognitive Status: Within Functional Limits for tasks assessed  Safety/Judgement: Decreased awareness of safety     General Comments: required mod cues for RW safety as pt frequently running into obstacles on either side and also cues for safety with navigating tight spaces/turns. O2 sats dropped to 90%  with ambulation but recovered to 94% with seated rest        Exercises      General Comments        Pertinent Vitals/Pain Pain Assessment Pain Assessment: No/denies pain    Home Living                          Prior Function            PT Goals (current goals can now be found in the care plan section) Acute Rehab PT Goals Patient Stated Goal: to go home PT Goal Formulation: With patient Time For Goal Achievement: 09/16/21 Potential to Achieve Goals: Good Progress towards PT goals: Progressing toward goals    Frequency    Min 3X/week      PT Plan Current plan remains appropriate;Discharge plan needs to be updated    Co-evaluation              AM-PAC PT "6 Clicks" Mobility   Outcome Measure  Help needed turning from your back to your side while in a flat bed without using bedrails?: None Help needed moving from lying on your back to sitting on the side of a flat bed without using bedrails?: None Help needed moving to and from a bed to a chair (including a wheelchair)?: A Little Help needed standing up from a chair using your arms (e.g., wheelchair or bedside chair)?: A Little Help needed to walk in hospital room?: A Little Help needed climbing 3-5 steps with a railing? : A Little 6 Click Score: 20    End of Session Equipment Utilized During Treatment: Gait belt Activity Tolerance: Patient limited by fatigue;Patient limited by lethargy Patient left: in bed;with call bell/phone within reach;Other (comment) (with transport arriving to take pt to CT) Nurse Communication: Mobility status PT Visit Diagnosis: Unsteadiness on feet (R26.81);Difficulty in walking, not elsewhere classified (R26.2);Other abnormalities of gait and mobility (R26.89);Muscle weakness (generalized) (M62.81)     Time: 3235-5732 PT Time Calculation (min) (ACUTE ONLY): 18 min  Charges:  $Gait Training: 8-22 mins                     Becky Sax PT, DPT  Blenda Nicely 09/05/2021, 10:55 AM

## 2021-09-05 NOTE — Progress Notes (Signed)
PHARMACY - PHYSICIAN COMMUNICATION CRITICAL VALUE ALERT - BLOOD CULTURE IDENTIFICATION (BCID)  Assessment:61 YOF with known colovaginal fistula presented with hallucinations and tremors suspected to be secondary to alcohol withdrawal. Found to have hydronephrosis on imaging.   Suspected source(s): Urinary   Culture Result(s): 2/4 bottles GNRs (aerobic and anaerobic- 1 set)   BCID Result(s): Enterobacterales speciated as E. Coli, with no resistance mechanisms detected    Name of physician (or Provider) Contacted: Rai Ripudeep, MD   Changes to prescribed antibiotics recommended:  Patient currently on ceftriaxone 2g IV Q24H- no changes recommended at this time   Adria Dill, PharmD PGY-2 Infectious Diseases Resident  09/05/2021 12:17 PM

## 2021-09-06 ENCOUNTER — Encounter (HOSPITAL_COMMUNITY): Payer: Self-pay | Admitting: Urology

## 2021-09-06 DIAGNOSIS — R7881 Bacteremia: Secondary | ICD-10-CM | POA: Clinically undetermined

## 2021-09-06 DIAGNOSIS — Z86711 Personal history of pulmonary embolism: Secondary | ICD-10-CM | POA: Diagnosis not present

## 2021-09-06 DIAGNOSIS — I480 Paroxysmal atrial fibrillation: Secondary | ICD-10-CM

## 2021-09-06 DIAGNOSIS — N139 Obstructive and reflux uropathy, unspecified: Secondary | ICD-10-CM

## 2021-09-06 DIAGNOSIS — N201 Calculus of ureter: Secondary | ICD-10-CM

## 2021-09-06 DIAGNOSIS — N179 Acute kidney failure, unspecified: Secondary | ICD-10-CM

## 2021-09-06 DIAGNOSIS — N10 Acute tubulo-interstitial nephritis: Secondary | ICD-10-CM | POA: Diagnosis not present

## 2021-09-06 DIAGNOSIS — F10231 Alcohol dependence with withdrawal delirium: Secondary | ICD-10-CM | POA: Diagnosis not present

## 2021-09-06 DIAGNOSIS — B9620 Unspecified Escherichia coli [E. coli] as the cause of diseases classified elsewhere: Secondary | ICD-10-CM

## 2021-09-06 DIAGNOSIS — N1330 Unspecified hydronephrosis: Secondary | ICD-10-CM

## 2021-09-06 DIAGNOSIS — I10 Essential (primary) hypertension: Secondary | ICD-10-CM | POA: Diagnosis not present

## 2021-09-06 LAB — BASIC METABOLIC PANEL
Anion gap: 13 (ref 5–15)
BUN: 63 mg/dL — ABNORMAL HIGH (ref 6–20)
CO2: 22 mmol/L (ref 22–32)
Calcium: 9.1 mg/dL (ref 8.9–10.3)
Chloride: 105 mmol/L (ref 98–111)
Creatinine, Ser: 6.5 mg/dL — ABNORMAL HIGH (ref 0.44–1.00)
GFR, Estimated: 7 mL/min — ABNORMAL LOW (ref >60)
Glucose, Bld: 114 mg/dL — ABNORMAL HIGH (ref 70–99)
Potassium: 6.8 mmol/L (ref 3.5–5.1)
Sodium: 140 mmol/L (ref 135–145)

## 2021-09-06 LAB — RENAL FUNCTION PANEL
Albumin: 2.9 g/dL — ABNORMAL LOW (ref 3.5–5.0)
Anion gap: 9 (ref 5–15)
BUN: 60 mg/dL — ABNORMAL HIGH (ref 6–20)
CO2: 22 mmol/L (ref 22–32)
Calcium: 8.8 mg/dL — ABNORMAL LOW (ref 8.9–10.3)
Chloride: 104 mmol/L (ref 98–111)
Creatinine, Ser: 5.7 mg/dL — ABNORMAL HIGH (ref 0.44–1.00)
GFR, Estimated: 8 mL/min — ABNORMAL LOW (ref >60)
Glucose, Bld: 94 mg/dL (ref 70–99)
Phosphorus: 6.3 mg/dL — ABNORMAL HIGH (ref 2.5–4.6)
Potassium: 6.6 mmol/L (ref 3.5–5.1)
Sodium: 135 mmol/L (ref 135–145)

## 2021-09-06 LAB — ANA W/REFLEX IF POSITIVE: Anti Nuclear Antibody (ANA): NEGATIVE

## 2021-09-06 LAB — CBC
HCT: 36.4 % (ref 36.0–46.0)
Hemoglobin: 11.4 g/dL — ABNORMAL LOW (ref 12.0–15.0)
MCH: 29.9 pg (ref 26.0–34.0)
MCHC: 31.3 g/dL (ref 30.0–36.0)
MCV: 95.5 fL (ref 80.0–100.0)
Platelets: 157 10*3/uL (ref 150–400)
RBC: 3.81 MIL/uL — ABNORMAL LOW (ref 3.87–5.11)
RDW: 15.1 % (ref 11.5–15.5)
WBC: 29.4 10*3/uL — ABNORMAL HIGH (ref 4.0–10.5)
nRBC: 0 % (ref 0.0–0.2)

## 2021-09-06 LAB — URINE CULTURE: Culture: 50000 — AB

## 2021-09-06 LAB — MAGNESIUM: Magnesium: 2 mg/dL (ref 1.7–2.4)

## 2021-09-06 LAB — C4 COMPLEMENT: Complement C4, Body Fluid: 49 mg/dL — ABNORMAL HIGH (ref 12–38)

## 2021-09-06 LAB — POTASSIUM: Potassium: 5.2 mmol/L — ABNORMAL HIGH (ref 3.5–5.1)

## 2021-09-06 LAB — C3 COMPLEMENT: C3 Complement: 198 mg/dL — ABNORMAL HIGH (ref 82–167)

## 2021-09-06 MED ORDER — SODIUM ZIRCONIUM CYCLOSILICATE 10 G PO PACK
10.0000 g | PACK | Freq: Once | ORAL | Status: AC
  Administered 2021-09-06: 10 g via ORAL
  Filled 2021-09-06: qty 1

## 2021-09-06 MED ORDER — ALBUTEROL SULFATE HFA 108 (90 BASE) MCG/ACT IN AERS
2.0000 | INHALATION_SPRAY | RESPIRATORY_TRACT | Status: DC | PRN
  Administered 2021-09-07 (×2): 2 via RESPIRATORY_TRACT

## 2021-09-06 MED ORDER — CEFAZOLIN SODIUM-DEXTROSE 2-4 GM/100ML-% IV SOLN
2.0000 g | Freq: Two times a day (BID) | INTRAVENOUS | Status: DC
  Administered 2021-09-06 – 2021-09-07 (×2): 2 g via INTRAVENOUS
  Filled 2021-09-06 (×4): qty 100

## 2021-09-06 MED ORDER — CALCIUM GLUCONATE 10 % IV SOLN
1.0000 g | Freq: Once | INTRAVENOUS | Status: AC
Start: 2021-09-06 — End: 2021-09-06
  Administered 2021-09-06: 1 g via INTRAVENOUS
  Filled 2021-09-06: qty 10

## 2021-09-06 MED ORDER — ALBUTEROL SULFATE (2.5 MG/3ML) 0.083% IN NEBU: 2.5000 mg | INHALATION_SOLUTION | Freq: Four times a day (QID) | RESPIRATORY_TRACT | Status: DC | PRN

## 2021-09-06 MED ORDER — INSULIN REGULAR BOLUS VIA INFUSION
5.0000 [IU] | Freq: Once | INTRAVENOUS | Status: DC
Start: 2021-09-06 — End: 2021-09-06

## 2021-09-06 MED ORDER — INSULIN ASPART 100 UNIT/ML IV SOLN
5.0000 [IU] | Freq: Once | INTRAVENOUS | Status: AC
Start: 2021-09-06 — End: 2021-09-06
  Administered 2021-09-06: 5 [IU] via INTRAVENOUS

## 2021-09-06 MED ORDER — LORAZEPAM 2 MG/ML IJ SOLN
0.0000 mg | Freq: Three times a day (TID) | INTRAMUSCULAR | Status: DC
  Administered 2021-09-06: 1 mg via INTRAVENOUS
  Administered 2021-09-07: 2 mg via INTRAVENOUS
  Filled 2021-09-06 (×2): qty 1

## 2021-09-06 MED ORDER — SODIUM CHLORIDE 0.9 % IV SOLN: INTRAVENOUS | Status: DC

## 2021-09-06 MED ORDER — ALBUTEROL SULFATE HFA 108 (90 BASE) MCG/ACT IN AERS
2.0000 | INHALATION_SPRAY | Freq: Three times a day (TID) | RESPIRATORY_TRACT | Status: DC
  Administered 2021-09-07 – 2021-09-08 (×4): 2 via RESPIRATORY_TRACT
  Filled 2021-09-06: qty 6.7

## 2021-09-06 MED ORDER — SODIUM ZIRCONIUM CYCLOSILICATE 10 G PO PACK
10.0000 g | PACK | Freq: Three times a day (TID) | ORAL | Status: AC
  Administered 2021-09-06 – 2021-09-07 (×3): 10 g via ORAL
  Filled 2021-09-06 (×3): qty 1

## 2021-09-06 MED ORDER — ALBUTEROL SULFATE (2.5 MG/3ML) 0.083% IN NEBU
2.5000 mg | INHALATION_SOLUTION | Freq: Once | RESPIRATORY_TRACT | Status: AC
Start: 2021-09-06 — End: 2021-09-06
  Administered 2021-09-06: 2.5 mg via RESPIRATORY_TRACT

## 2021-09-06 MED ORDER — SODIUM BICARBONATE 8.4 % IV SOLN
50.0000 meq | Freq: Once | INTRAVENOUS | Status: AC
  Administered 2021-09-06: 50 meq via INTRAVENOUS
  Filled 2021-09-06: qty 50

## 2021-09-06 MED ORDER — DEXTROSE 50 % IV SOLN
25.0000 mL | Freq: Once | INTRAVENOUS | Status: AC
Start: 2021-09-06 — End: 2021-09-06
  Administered 2021-09-06: 25 mL via INTRAVENOUS
  Filled 2021-09-06: qty 50

## 2021-09-06 MED ORDER — METOPROLOL TARTRATE 25 MG PO TABS
12.5000 mg | ORAL_TABLET | Freq: Two times a day (BID) | ORAL | Status: DC
  Administered 2021-09-06 – 2021-09-10 (×8): 12.5 mg via ORAL
  Filled 2021-09-06 (×8): qty 1

## 2021-09-06 NOTE — Progress Notes (Signed)
[facility] Progress Note  Subjective: creat down to 5s this am, K+ improved 6.8 >> 5.2 on repeat this afternoon. Very good UOP.   Vitals:   09/05/21 2123 09/06/21 0012 09/06/21 0758 09/06/21 1213  BP: (!) 169/102 134/84  137/85  Pulse: (!) 107 (!) 102  96  Resp: '18 18  20  '$ Temp: 97.8 F (36.6 C) 98.2 F (36.8 C)  98 F (36.7 C)  TempSrc: Oral Oral  Oral  SpO2: 99% 97% 98% 100%  Weight:      Height:        Exam: General: obese, sitting on side Heart: tachy Lungs: poor effort-   Abdomen: obese, non tender Extremities: trace edema to LEs     Assessment/Plan  AKI - suspect due to obstructive uropathy (detected 06/2021) and hypovolemia and hemodynamics in the setting of EtOH withdrawal and possible sepsis previously on ARB.  Renal function normal in March of 2023, creat up at 1.76 in 5/23 in the setting of nephrolithiasis. Then 3.95 on presentation here. Started IVF's given soft BP and ^HR, foley placed also. CT then showed R hydronephrosis. BCx's and UCx are + for [facility] . Urology stented R ureter on 7/27 yesterday. Creat was 8.6 at peak, is down to 5.7 at 7am today and UOP so far today is 2.8 liters. Will cont IVF"s and supportive care w/ IV abx. Will follow.  Hyperkalemia - up to 6.8, sp temporizing measures and lokelma. Repeat K+ 5.2.  Will cont lokelma and changed diet to renal diet. Alcohol withdrawal - on CIWA protocol Leukocytosis/ sepsis - infection as above, on IV abx Afib - on Eliquis and metoprolol BP/ vol - volume ok on exam, BP's stable  Rob Margo Lama 09/06/2021, 4:50 PM   Recent Labs  Lab 09/04/21 2109 09/05/21 1157 09/06/21 0353 09/06/21 0742 09/06/21 1526  HGB 11.2*  --  11.4*  --   --   ALBUMIN 2.7* 2.7*  --  2.9*  --   CALCIUM 8.7* 8.9 9.1 8.8*  --   PHOS  --  4.1  --  6.3*  --   CREATININE 8.22* 8.64* 6.50* 5.70*  --   K 5.2* 5.1 6.8* 6.6* 5.2*   No results for input(s): "IRON", "TIBC", "FERRITIN" in the last 168 hours. Inpatient  medications:  apixaban  5 mg Oral BID   atorvastatin  20 mg Oral Daily   docusate sodium  100 mg Oral BID   folic acid  1 mg Oral Daily   LORazepam  0-4 mg Intravenous Q8H   metoprolol succinate  25 mg Oral Daily   multivitamin with minerals  1 tablet Oral Daily   pantoprazole  40 mg Oral Daily   sodium chloride flush  3 mL Intravenous Q12H   sodium zirconium cyclosilicate  10 g Oral TID   thiamine  100 mg Oral Daily     ceFAZolin (ANCEF) IV     lactated ringers 125 mL/hr at 09/06/21 1558   acetaminophen **OR** acetaminophen, albuterol, bisacodyl, hydrALAZINE, ondansetron **OR** ondansetron (ZOFRAN) IV, oxyCODONE, polyethylene glycol, Polyethyl Glycol-Propyl Glycol

## 2021-09-06 NOTE — Progress Notes (Signed)
   09/06/21 0630  Provider Notification  Provider Name/Title Aline August MD  Date Provider Notified 09/06/21  Time Provider Notified 0845  Method of Notification Page  Notification Reason Critical result  Test performed and critical result Potassium 6.6  Date Critical Result Received 09/06/21  Time Critical Result Received 0837  Provider response See new orders  Date of Provider Response 09/06/21  Time of Provider Response [phone]

## 2021-09-06 NOTE — Progress Notes (Signed)
Patient c/o having orders for nebs instead of an inhaler. She states she uses her inhaler at home on a routine basis of at least 2-3 times per day. RT assessment completed. Orders changed according to score per protocol. MDI ordered per patient request.

## 2021-09-06 NOTE — Progress Notes (Signed)
Pharmacy Antibiotic Note  [name] is a 61 y.o. female admitted on 09/02/2021 with E.coli bacteremia secondary to urinary source with extensive stones. She is s/p stent placement. Pharmacy has been consulted for cefazolin dosing.  Plan: Cefazolin 2 g q12h adjusted for renal function  Height: '5\' 1"'$  (154.9 cm) Weight: 135 kg (297 lb 9.9 oz) IBW/kg (Calculated) : 47.8  Temp (24hrs), Avg:98.3 F (36.8 C), Min:97.8 F (36.6 C), Max:98.8 F (37.1 C)  Recent Labs  Lab 09/02/21 0753 09/03/21 0321 09/04/21 0704 09/04/21 2109 09/05/21 1157 09/06/21 0353 09/06/21 0742  WBC 13.5* 11.5* 20.8* 28.2*  --  29.4*  --   CREATININE 3.58* 5.09* 7.14* 8.22* 8.64* 6.50* 5.70*    Estimated Creatinine Clearance: 13.7 mL/min (A) (by C-G formula based on SCr of 5.7 mg/dL (H)).    Allergies  Allergen Reactions   Morphine Itching   Shrimp [Shellfish Allergy] Itching and Other (See Comments)    Tongue burns also   Tramadol Other (See Comments)    Caused confusion   Covid-19 (Mrna) Vaccine Hives   Diltiazem Hcl Itching    Pt with itching of the feet when bolus given   Other Other (See Comments)   Latex Rash    Antimicrobials this admission: Cefazolin 7/28 >> Ceftriaxone 7/26 >> 7/28   Microbiology results: 7/28 BCx: 2/2 sets E.coli 7/26 UCx: 50k E.coli 7/27 UCx (cystoscope): pending    Thank you for allowing pharmacy to be a part of this patient's care.  Tawnya Crook, PharmD, BCPS Clinical Pharmacist 09/06/2021 12:36 PM

## 2021-09-06 NOTE — Progress Notes (Signed)
1 Day Post-Op Subjective: Denies abdominal pain or flank pain. Afebrile. Tolerating stent.  Objective: Vital signs in last 24 hours: Temp:  [97.8 F (36.6 C)-98.8 F (37.1 C)] 98.2 F (36.8 C) (07/28 0012) Pulse Rate:  [99-111] 102 (07/28 0012) Resp:  [18-27] 18 (07/28 0012) BP: (109-169)/(70-102) 134/84 (07/28 0012) SpO2:  [95 %-99 %] 97 % (07/28 0012) FiO2 (%):  [94 %-98 %] 98 % (07/27 1945)  Intake/Output from previous day: 07/27 0701 - 07/28 0700 In: 1234.8 [I.V.:1134.8; IV Piggyback:100] Out: 1750 [Urine:1750] Intake/Output this shift: No intake/output data recorded.  UOP: 1.8L  Physical Exam:  General: Alert and oriented CV: RRR Lungs: Clear Abdomen: Soft, ND, NT, obese Ext: NT, No erythema  Lab Results: Recent Labs    09/04/21 0704 09/04/21 2109  HGB 11.2* 11.2*  HCT 34.0* 34.5*   BMET Recent Labs    09/04/21 2109 09/05/21 1157  NA 132* 133*  K 5.2* 5.1  CL 100 100  CO2 19* 22  GLUCOSE 134* 96  BUN 53* 55*  CREATININE 8.22* 8.64*  CALCIUM 8.7* 8.9     Studies/Results: DG C-Arm 1-60 Min-No Report  Result Date: 09/05/2021 Fluoroscopy was utilized by the requesting physician.  No radiographic interpretation.   CT ABDOMEN PELVIS WO CONTRAST  Result Date: 09/05/2021 CLINICAL DATA:  Sepsis, history of colovaginal fistula EXAM: CT ABDOMEN AND PELVIS WITHOUT CONTRAST TECHNIQUE: Multidetector CT imaging of the abdomen and pelvis was performed following the standard protocol without IV contrast. RADIATION DOSE REDUCTION: This exam was performed according to the departmental dose-optimization program which includes automated exposure control, adjustment of the mA and/or kV according to patient size and/or use of iterative reconstruction technique. COMPARISON:  CT 06/13/2021 FINDINGS: Lower chest: Mild cardiomegaly. Aortic valve calcifications. Mild aortic atherosclerotic calcification. No pericardial effusion. Respiratory motion obscures detail in the lungs.  Unchanged subcarinal lymph node complex measuring 2.5 x 1.9 cm. Hepatobiliary: Marked hepatic steatosis. Multiple enhancing liver lesions unchanged from prior and likely representing hepatic hemangiomas. No biliary ductal dilation. No cholelithiasis. Pancreas: Unremarkable. No pancreatic ductal dilatation or surrounding inflammatory changes. Spleen: Normal in size without focal abnormality. Adrenals/Urinary Tract: Adrenal glands are unremarkable. Nonobstructing calyceal stones measuring 2-3 mm in both kidneys. New 6 mm stone in the proximal right ureter mild upstream hydronephrosis and hydroureter slightly increased from 06/13/2021. Previously 2 mm stone at the right UVJ and 1.5 cm stone at the left UPJ are no longer visualized. The evaluation for pyelonephritis is limited without IV contrast. No significant perinephric stranding. The bladder is decompressed about a Foley catheter. Locules of gas in the bladder likely related to Foley catheter placement. Stomach/Bowel: Stomach is within normal limits. The appendix is normal. No evidence of bowel wall thickening, distention, or inflammatory changes. Colonic diverticulosis without diverticulitis. The sigmoid colon again appears tethered along the left aspect of the bladder near the tract of the prior colovaginal fistula. It remains indeterminate whether the fistula is patent. Vascular/Lymphatic: Aortic atherosclerotic calcification. No enlarged abdominal or pelvic lymph nodes. Reproductive: Hysterectomy. Bandlike soft tissue thickening again extends from the upper left vaginal cuff to the left pelvic sidewall and again abuts the sigmoid colon (circa image 60 on series 6). Other: No free intraperitoneal fluid or air. Musculoskeletal: Advanced multilevel bridging anterior osteophytes of the thoracic spine with ankylosis compatible with DISH. IMPRESSION: 1. 6 mm stone in the proximal right ureter with mild upstream hydro nephrosis. Evaluation for pyelonephritis is  limited without IV contrast. 2. Interval placement of a Foley catheter within  the decompressed bladder. Gas within the bladder is presumed due to Foley catheter placement. However there is unchanged appearance of the chronic colovaginal/colovesical fistula. There remains indeterminate within the fistula is patent. 3. Enlarged severely steatotic liver.  Stable hemangiomas. Electronically Signed   By: Placido Sou M.D.   On: 09/05/2021 11:52   US RENAL  Result Date: 09/04/2021 CLINICAL DATA:  [id number].  Acute kidney injury EXAM: RENAL / URINARY TRACT ULTRASOUND COMPLETE COMPARISON:  CT abdomen pelvis 06/13/2021 FINDINGS: Right Kidney: Renal measurements: 13.7 x 7 x 5.9 cm = volume: 296 mL. Echogenicity within normal limits. No mass. Mild hydronephrosis visualized. Left Kidney: Renal measurements: 11.1 x 6 x 5.3 cm = volume: 184 mL. Echogenicity within normal limits. No mass. Possible trace hydronephrosis visualized. Urinary bladder: Not visualized. Other: None. IMPRESSION: 1. Mild right hydronephrosis. 2. Possible trace left hydronephrosis. 3. Urinary bladder not visualized. Electronically Signed   By: Iven Finn M.D.   On: 09/04/2021 19:09   DG CHEST PORT 1 VIEW  Result Date: 09/04/2021 CLINICAL DATA:  Shortness of breath EXAM: PORTABLE CHEST 1 VIEW COMPARISON:  05/07/2021 FINDINGS: Transverse diameter heart is increased. Central pulmonary vessels are prominent. There are no signs of alveolar pulmonary edema or new focal infiltrates. Lateral CP angles are indistinct. There is no pneumothorax. IMPRESSION: Cardiomegaly. Central pulmonary vessels are prominent suggesting CHF. There are no signs of alveolar pulmonary edema or focal pulmonary consolidation. Lateral costophrenic angles are indistinct which may be related to chest wall attenuation or pleural thickening or small effusions. Electronically Signed   By: Elmer Picker M.D.   On: 09/04/2021 18:05    Assessment/Plan: Obstructing right  ureteral stone with pyelonephritis.  CT A/P 09/05/2021 with 6 mm stone with mild right hydronephrosis.  Previously hospitalized over the past several days with hypotension, tachycardia, persistent leukocytosis and AKI.  Bacteremic with Enterobacterales.  Urine culture with E. coli. S/p R stent 09/05/2021 Acute renal failure: Creatinine 8.64 at time of consultation from baseline of 1.0 Alcohol dependence with withdrawal delirium Paroxysmal atrial fibrillation History of pulmonary embolism on Eliquis Past medical history of hypertension and hyperlipidemia Morbid obesity History of colovesicular fistula  -Trend creatinine -F/u urine culture -Will require 10 days of PO abx at discharge -Will arrange outpatient followup for definitive management of stone -Following peripherally. Please call with questions.   LOS: 4 days   Matt R. Deiona Hooper MD 09/06/2021, 7:04 AM [facility]  Pager: [phone]

## 2021-09-06 NOTE — Progress Notes (Signed)
PROGRESS NOTE    [name]  IRW:[id number] DOB: [birth year] DOA: 09/02/2021 PCP: Biagio Borg, MD   Brief Narrative:  61 year old female with history of colovaginal fistula in the past, prior bladder and colonic abscess with pelvic drainage in 2018, paroxysmal A-fib on Eliquis, mild intermittent asthma, hypertension, hyperlipidemia, alcohol abuse presented with tremors and hallucinations and was initially admitted for alcohol withdrawal.  She was also subsequently found to have severe sepsis with acute kidney injury with obstructive uropathy/pyelonephritis/right ureterolithiasis with hydronephrosis and E. coli bacteremia and started on IV fluids and antibiotics.  Creatinine worsened to 8.6 from a baseline of 1.  Urology and nephrology were consulted.  She was transferred to [facility] hospital from [facility] per urology recommendations.  She underwent cystoscopy and stenting on 09/05/2021.  Assessment & Plan:   Severe sepsis Acute kidney injury UTI/pyelonephritis/obstructive uropathy/right ureterolithiasis with hydronephrosis E. coli bacteremia -Baseline creatinine of 1.  Presented with creatinine of 3.58 and worsened to 8.6.  Urology and nephrology were consulted.  Treated with IV fluids. -Status post cystoscopy and right ureteral stent on 09/05/2021 by urology.  Urology following and recommend appropriate antibiotics and will follow up as an outpatient for definitive management management of stool -Currently on Rocephin.  IV fluids as per nephrology. -Creatinine improving to 5.7 today.  Hyperkalemia -Potassium 6.  8 and subsequently 6.6 this morning.  Given IV sodium bicarbonate/calcium gluconate/insulin and dextrose along with oral Lokelma and nebulized albuterol. -Repeat a.m. labs.  Follow nephrology recommendations  Leukocytosis -WBC still significantly elevated.  Repeat a.m. labs.  Alcohol withdrawal Alcohol abuse -Continue CIWA protocol.  Patient very drowsy this  morning.  Will DC scheduled diazepam and use lorazepam as needed.  Continue thiamine, folic acid and multivitamin.  TOC consult  Paroxysmal A-fib -Intermittently tachycardic.  Continue metoprolol succinate and apixaban.  Hyperlipidemia -Continue statin  Morbid obesity -Outpatient follow-up  History of pulmonary embolism -Continue Eliquis  Physical deconditioning -Will need home health PT  DVT prophylaxis: Apixaban Code Status: Full Family Communication: None at bedside Disposition Plan: Status is: Inpatient Remains inpatient appropriate because: Of severity of illness    Consultants: Urology/nephrology  Procedures: Cystoscopy and right ureteral stent placement on 09/05/2021 by urology  Antimicrobials:  Anti-infectives (From admission, onward)    Start     Dose/Rate Route Frequency Ordered Stop   09/05/21 2000  cefTRIAXone (ROCEPHIN) 2 g in sodium chloride 0.9 % 100 mL IVPB        2 g 200 mL/hr over 30 Minutes Intravenous Every 24 hours 09/05/21 0614     09/05/21 0715  metroNIDAZOLE (FLAGYL) IVPB 500 mg  Status:  Discontinued        500 mg 100 mL/hr over 60 Minutes Intravenous Every 12 hours 09/05/21 0616 09/05/21 0618   09/04/21 2000  cefTRIAXone (ROCEPHIN) 1 g in sodium chloride 0.9 % 100 mL IVPB  Status:  Discontinued        1 g 200 mL/hr over 30 Minutes Intravenous Every 24 hours 09/04/21 1920 09/05/21 7619       Subjective: Patient seen and examined at bedside.  Sleepy, wakes up slightly, does not answer much questions and goes back to sleep.  No fever, seizures, vomiting reported by nursing staff.  Objective: Vitals:   09/05/21 2015 09/05/21 2123 09/06/21 0012 09/06/21 0758  BP: (!) 144/93 (!) 169/102 134/84   Pulse: (!) 109 (!) 107 (!) 102   Resp: '20 18 18   '$ Temp: 98.8 F (37.1 C) 97.8 F (  36.6 C) 98.2 F (36.8 C)   TempSrc:  Oral Oral   SpO2: 95% 99% 97% 98%  Weight:      Height:        Intake/Output Summary (Last 24 hours) at 09/06/2021  1038 Last data filed at 09/06/2021 0500 Gross per 24 hour  Intake 1234.77 ml  Output 1750 ml  Net -515.23 ml   Filed Weights   09/02/21 0740  Weight: 135 kg    Examination:  General exam: Appears calm and comfortable.  Looks chronically ill and deconditioned.  Currently on 3 L oxygen via nasal cannula.   Respiratory system: Bilateral decreased breath sounds at bases with scattered crackles Cardiovascular system: S1 & S2 heard, tachycardic Gastrointestinal system: Abdomen is morbidly obese, distended, soft and nontender. Normal bowel sounds heard. Extremities: No cyanosis, clubbing; trace lower extremity edema present  central nervous system:  Sleepy, wakes up slightly, does not answer much questions and goes back to sleep. No focal neurological deficits. Moving extremities Skin: No rashes, lesions or ulcers Psychiatry: Could not be assessed because of mental status.   Data Reviewed: I have personally reviewed following labs and imaging studies  CBC: Recent Labs  Lab 09/02/21 0753 09/03/21 0321 09/04/21 0704 09/04/21 2109 09/05/21 2139 09/06/21 0353  WBC 13.5* 11.5* 20.8* 28.2*  --  29.4*  NEUTROABS  --   --   --  26.2* 26.6*  --   HGB 12.9 11.4* 11.2* 11.2*  --  11.4*  HCT 39.5 34.1* 34.0* 34.5*  --  36.4  MCV 91.2 91.2 90.9 92.5  --  95.5  PLT 270 231 215 188  --  878   Basic Metabolic Panel: Recent Labs  Lab 09/04/21 0704 09/04/21 2109 09/05/21 1157 09/06/21 0353 09/06/21 0742 09/06/21 0752  NA 135 132* 133* 140 135  --   K 4.5 5.2* 5.1 6.8* 6.6*  --   CL 102 100 100 105 104  --   CO2 21* 19* '22 22 22  '$ --   GLUCOSE 128* 134* 96 114* 94  --   BUN 46* 53* 55* 63* 60*  --   CREATININE 7.14* 8.22* 8.64* 6.50* 5.70*  --   CALCIUM 8.8* 8.7* 8.9 9.1 8.8*  --   MG  --   --   --   --   --  2.0  PHOS  --   --  4.1  --  6.3*  --    GFR: Estimated Creatinine Clearance: 13.7 mL/min (A) (by C-G formula based on SCr of 5.7 mg/dL (H)). Liver Function Tests: Recent  Labs  Lab 09/02/21 0753 09/04/21 2109 09/05/21 1157 09/06/21 0742  AST 67* 58*  --   --   ALT 58* 46*  --   --   ALKPHOS 129* 119  --   --   BILITOT 0.5 0.7  --   --   PROT 7.3 6.6  --   --   ALBUMIN 3.0* 2.7* 2.7* 2.9*   Recent Labs  Lab 09/02/21 0753  LIPASE 24   No results for input(s): "AMMONIA" in the last 168 hours. Coagulation Profile: No results for input(s): "INR", "PROTIME" in the last 168 hours. Cardiac Enzymes: Recent Labs  Lab 09/05/21 0950  CKTOTAL 426*   BNP (last 3 results) No results for input(s): "PROBNP" in the last 8760 hours. HbA1C: No results for input(s): "HGBA1C" in the last 72 hours. CBG: Recent Labs  Lab 09/05/21 1939  GLUCAP 134*   Lipid Profile: No results for  input(s): "CHOL", "HDL", "LDLCALC", "TRIG", "CHOLHDL", "LDLDIRECT" in the last 72 hours. Thyroid Function Tests: No results for input(s): "TSH", "T4TOTAL", "FREET4", "T3FREE", "THYROIDAB" in the last 72 hours. Anemia Panel: No results for input(s): "VITAMINB12", "FOLATE", "FERRITIN", "TIBC", "IRON", "RETICCTPCT" in the last 72 hours. Sepsis Labs: Recent Labs  Lab 09/04/21 2109  PROCALCITON 57.93    Recent Results (from the past 240 hour(s))  Urine Culture     Status: Abnormal   Collection Time: 09/04/21  5:16 PM   Specimen: Urine, Clean Catch  Result Value Ref Range Status   Specimen Description URINE, CLEAN CATCH  Final   Special Requests   Final    NONE Performed at [facility], [place] [address], [place], [place] [address]    Culture 50,000 COLONIES/mL ESCHERICHIA COLI (A)  Final   Report Status 09/06/2021 FINAL  Final   Organism ID, Bacteria ESCHERICHIA COLI (A)  Final      Susceptibility   Escherichia coli - MIC*    AMPICILLIN >=32 RESISTANT Resistant     CEFAZOLIN <=4 SENSITIVE Sensitive     CEFEPIME <=0.12 SENSITIVE Sensitive     CEFTRIAXONE <=0.25 SENSITIVE Sensitive     CIPROFLOXACIN <=0.25 SENSITIVE Sensitive     GENTAMICIN <=1 SENSITIVE Sensitive      IMIPENEM <=0.25 SENSITIVE Sensitive     NITROFURANTOIN <=16 SENSITIVE Sensitive     TRIMETH/SULFA <=20 SENSITIVE Sensitive     AMPICILLIN/SULBACTAM 16 INTERMEDIATE Intermediate     PIP/TAZO <=4 SENSITIVE Sensitive     * 50,000 COLONIES/mL ESCHERICHIA COLI  Culture, blood (Routine X 2) w Reflex to ID Panel     Status: None (Preliminary result)   Collection Time: 09/04/21  9:09 PM   Specimen: BLOOD  Result Value Ref Range Status   Specimen Description BLOOD RIGHT ANTECUBITAL  Final   Special Requests   Final    BOTTLES DRAWN AEROBIC AND ANAEROBIC Blood Culture adequate volume   Culture  Setup Time   Final    GRAM NEGATIVE RODS CRITICAL RESULT CALLED TO, READ BACK BY AND VERIFIED WITH: PHARMD AUSTIN PAYTES [id number] PM BY EC Performed at [facility], [address] [place], [place] [address]    Culture GRAM NEGATIVE RODS  Final   Report Status PENDING  Incomplete  Culture, blood (Routine X 2) w Reflex to ID Panel     Status: Abnormal (Preliminary result)   Collection Time: 09/04/21  9:09 PM   Specimen: BLOOD LEFT HAND  Result Value Ref Range Status   Specimen Description BLOOD LEFT HAND  Final   Special Requests   Final    BOTTLES DRAWN AEROBIC AND ANAEROBIC Blood Culture adequate volume   Culture  Setup Time   Final    GRAM NEGATIVE RODS ANAEROBIC BOTTLE ONLY Organism ID to follow CRITICAL RESULT CALLED TO, READ BACK BY AND VERIFIED WITH: PHARMD AUSTIN PAYTES [id number] PM BY EC Performed at [facility], [place] [address] [place], [place] [address]    Culture ESCHERICHIA COLI (A)  Final   Report Status PENDING  Incomplete  Blood Culture ID Panel (Reflexed)     Status: Abnormal   Collection Time: 09/04/21  9:09 PM  Result Value Ref Range Status   Enterococcus faecalis NOT DETECTED NOT DETECTED Final   Enterococcus Faecium NOT DETECTED NOT DETECTED Final   Listeria monocytogenes NOT DETECTED NOT DETECTED Final   Staphylococcus species NOT DETECTED NOT  DETECTED Final   Staphylococcus aureus (BCID) NOT DETECTED  NOT DETECTED Final   Staphylococcus epidermidis NOT DETECTED NOT DETECTED Final   Staphylococcus lugdunensis NOT DETECTED NOT DETECTED Final   Streptococcus species NOT DETECTED NOT DETECTED Final   Streptococcus agalactiae NOT DETECTED NOT DETECTED Final   Streptococcus pneumoniae NOT DETECTED NOT DETECTED Final   Streptococcus pyogenes NOT DETECTED NOT DETECTED Final   A.calcoaceticus-baumannii NOT DETECTED NOT DETECTED Final   Bacteroides fragilis NOT DETECTED NOT DETECTED Final   Enterobacterales DETECTED (A) NOT DETECTED Final    Comment: Enterobacterales represent a large order of gram negative bacteria, not a single organism.   Enterobacter cloacae complex NOT DETECTED NOT DETECTED Final   Escherichia coli DETECTED (A) NOT DETECTED Final    Comment: CRITICAL RESULT CALLED TO, READ BACK BY AND VERIFIED WITH: PHARMD AUSTIN PAYTES [id number] PM BY EC    Klebsiella aerogenes NOT DETECTED NOT DETECTED Final   Klebsiella oxytoca NOT DETECTED NOT DETECTED Final   Klebsiella pneumoniae NOT DETECTED NOT DETECTED Final   Proteus species NOT DETECTED NOT DETECTED Final   Salmonella species NOT DETECTED NOT DETECTED Final   Serratia marcescens NOT DETECTED NOT DETECTED Final   Haemophilus influenzae NOT DETECTED NOT DETECTED Final   Neisseria meningitidis NOT DETECTED NOT DETECTED Final   Pseudomonas aeruginosa NOT DETECTED NOT DETECTED Final   Stenotrophomonas maltophilia NOT DETECTED NOT DETECTED Final   Candida albicans NOT DETECTED NOT DETECTED Final   Candida auris NOT DETECTED NOT DETECTED Final   Candida glabrata NOT DETECTED NOT DETECTED Final   Candida krusei NOT DETECTED NOT DETECTED Final   Candida parapsilosis NOT DETECTED NOT DETECTED Final   Candida tropicalis NOT DETECTED NOT DETECTED Final   Cryptococcus neoformans/gattii NOT DETECTED NOT DETECTED Final   CTX-M ESBL NOT DETECTED NOT DETECTED Final   Carbapenem  resistance IMP NOT DETECTED NOT DETECTED Final   Carbapenem resistance KPC NOT DETECTED NOT DETECTED Final   Carbapenem resistance NDM NOT DETECTED NOT DETECTED Final   Carbapenem resist OXA 48 LIKE NOT DETECTED NOT DETECTED Final   Carbapenem resistance VIM NOT DETECTED NOT DETECTED Final    Comment: Performed at [facility], [address], [place], [place] [address]         Radiology Studies: DG C-Arm 1-60 Min-No Report  Result Date: 09/05/2021 Fluoroscopy was utilized by the requesting physician.  No radiographic interpretation.   CT ABDOMEN PELVIS WO CONTRAST  Result Date: 09/05/2021 CLINICAL DATA:  Sepsis, history of colovaginal fistula EXAM: CT ABDOMEN AND PELVIS WITHOUT CONTRAST TECHNIQUE: Multidetector CT imaging of the abdomen and pelvis was performed following the standard protocol without IV contrast. RADIATION DOSE REDUCTION: This exam was performed according to the departmental dose-optimization program which includes automated exposure control, adjustment of the mA and/or kV according to patient size and/or use of iterative reconstruction technique. COMPARISON:  CT 06/13/2021 FINDINGS: Lower chest: Mild cardiomegaly. Aortic valve calcifications. Mild aortic atherosclerotic calcification. No pericardial effusion. Respiratory motion obscures detail in the lungs. Unchanged subcarinal lymph node complex measuring 2.5 x 1.9 cm. Hepatobiliary: Marked hepatic steatosis. Multiple enhancing liver lesions unchanged from prior and likely representing hepatic hemangiomas. No biliary ductal dilation. No cholelithiasis. Pancreas: Unremarkable. No pancreatic ductal dilatation or surrounding inflammatory changes. Spleen: Normal in size without focal abnormality. Adrenals/Urinary Tract: Adrenal glands are unremarkable. Nonobstructing calyceal stones measuring 2-3 mm in both kidneys. New 6 mm stone in the proximal right ureter mild upstream hydronephrosis and hydroureter slightly increased  from 06/13/2021. Previously 2 mm stone at the right UVJ and 1.5  cm stone at the left UPJ are no longer visualized. The evaluation for pyelonephritis is limited without IV contrast. No significant perinephric stranding. The bladder is decompressed about a Foley catheter. Locules of gas in the bladder likely related to Foley catheter placement. Stomach/Bowel: Stomach is within normal limits. The appendix is normal. No evidence of bowel wall thickening, distention, or inflammatory changes. Colonic diverticulosis without diverticulitis. The sigmoid colon again appears tethered along the left aspect of the bladder near the tract of the prior colovaginal fistula. It remains indeterminate whether the fistula is patent. Vascular/Lymphatic: Aortic atherosclerotic calcification. No enlarged abdominal or pelvic lymph nodes. Reproductive: Hysterectomy. Bandlike soft tissue thickening again extends from the upper left vaginal cuff to the left pelvic sidewall and again abuts the sigmoid colon (circa image 60 on series 6). Other: No free intraperitoneal fluid or air. Musculoskeletal: Advanced multilevel bridging anterior osteophytes of the thoracic spine with ankylosis compatible with DISH. IMPRESSION: 1. 6 mm stone in the proximal right ureter with mild upstream hydro nephrosis. Evaluation for pyelonephritis is limited without IV contrast. 2. Interval placement of a Foley catheter within the decompressed bladder. Gas within the bladder is presumed due to Foley catheter placement. However there is unchanged appearance of the chronic colovaginal/colovesical fistula. There remains indeterminate within the fistula is patent. 3. Enlarged severely steatotic liver.  Stable hemangiomas. Electronically Signed   By: Placido Sou M.D.   On: 09/05/2021 11:52   US RENAL  Result Date: 09/04/2021 CLINICAL DATA:  [id number].  Acute kidney injury EXAM: RENAL / URINARY TRACT ULTRASOUND COMPLETE COMPARISON:  CT abdomen pelvis 06/13/2021  FINDINGS: Right Kidney: Renal measurements: 13.7 x 7 x 5.9 cm = volume: 296 mL. Echogenicity within normal limits. No mass. Mild hydronephrosis visualized. Left Kidney: Renal measurements: 11.1 x 6 x 5.3 cm = volume: 184 mL. Echogenicity within normal limits. No mass. Possible trace hydronephrosis visualized. Urinary bladder: Not visualized. Other: None. IMPRESSION: 1. Mild right hydronephrosis. 2. Possible trace left hydronephrosis. 3. Urinary bladder not visualized. Electronically Signed   By: Iven Finn M.D.   On: 09/04/2021 19:09   DG CHEST PORT 1 VIEW  Result Date: 09/04/2021 CLINICAL DATA:  Shortness of breath EXAM: PORTABLE CHEST 1 VIEW COMPARISON:  05/07/2021 FINDINGS: Transverse diameter heart is increased. Central pulmonary vessels are prominent. There are no signs of alveolar pulmonary edema or new focal infiltrates. Lateral CP angles are indistinct. There is no pneumothorax. IMPRESSION: Cardiomegaly. Central pulmonary vessels are prominent suggesting CHF. There are no signs of alveolar pulmonary edema or focal pulmonary consolidation. Lateral costophrenic angles are indistinct which may be related to chest wall attenuation or pleural thickening or small effusions. Electronically Signed   By: Elmer Picker M.D.   On: 09/04/2021 18:05        Scheduled Meds:  apixaban  5 mg Oral BID   atorvastatin  20 mg Oral Daily   Chlorhexidine Gluconate Cloth  6 each Topical Daily   diazepam  5 mg Oral Q6H   docusate sodium  100 mg Oral BID   folic acid  1 mg Oral Daily   LORazepam  0-4 mg Intravenous Q8H   metoprolol succinate  25 mg Oral Daily   multivitamin with minerals  1 tablet Oral Daily   pantoprazole  40 mg Oral Daily   sodium chloride flush  3 mL Intravenous Q12H   thiamine  100 mg Oral Daily   Continuous Infusions:  cefTRIAXone (ROCEPHIN)  IV Stopped (09/06/21 0538)   lactated ringers 125 mL/hr  at 09/06/21 3335          Aline August, MD [facility] 09/06/2021, 10:38 AM

## 2021-09-06 NOTE — TOC Initial Note (Signed)
Transition of Care [facility]) - Initial/Assessment Note    Patient Details  Name: GLEE LASHOMB MRN: [id number] Date of Birth: [birth year]  Transition of Care [facility]) CM/SW Contact:    Leeroy Cha, RN Phone Number: 09/06/2021, 7:29 AM  Clinical Narrative:                 Will give substance abuse resources to patient.  Following for dme and hhc needs.  Expected Discharge Plan: Home/Self Care Barriers to Discharge: Continued Medical Work up   Patient Goals and CMS Choice Patient states their goals for this hospitalization and ongoing recovery are:: to go home and get clean CMS Medicare.gov Compare Post Acute Care list provided to:: Patient Choice offered to / list presented to : Patient  Expected Discharge Plan and Services Expected Discharge Plan: Home/Self Care   Discharge Planning Services: CM Consult   Living arrangements for the past 2 months: Single Family Home                                      Prior Living Arrangements/Services Living arrangements for the past 2 months: Single Family Home Lives with:: Spouse Patient language and need for interpreter reviewed:: Yes Do you feel safe going back to the place where you live?: Yes            Criminal Activity/Legal Involvement Pertinent to Current Situation/Hospitalization: No - Comment as needed  Activities of Daily Living Home Assistive Devices/Equipment: None ADL Screening (condition at time of admission) Patient's cognitive ability adequate to safely complete daily activities?: Yes Is the patient deaf or have difficulty hearing?: No Does the patient have difficulty seeing, even when wearing glasses/contacts?: No Does the patient have difficulty concentrating, remembering, or making decisions?: Yes Patient able to express need for assistance with ADLs?: Yes Does the patient have difficulty dressing or bathing?: No Independently performs ADLs?: No Communication: Independent Dressing (OT): Needs  assistance Is this a change from baseline?: Change from baseline, expected to last >3 days Grooming: Needs assistance Is this a change from baseline?: Change from baseline, expected to last >3 days Feeding: Independent Bathing: Needs assistance Is this a change from baseline?: Change from baseline, expected to last >3 days Toileting: Needs assistance Is this a change from baseline?: Change from baseline, expected to last >3days In/Out Bed: Needs assistance Is this a change from baseline?: Change from baseline, expected to last >3 days Walks in Home: Independent Does the patient have difficulty walking or climbing stairs?: No Weakness of Legs: None Weakness of Arms/Hands: None  Permission Sought/Granted                  Emotional Assessment Appearance:: Appears stated age Attitude/Demeanor/Rapport: Guarded Affect (typically observed): Calm Orientation: : Oriented to Self, Oriented to Place, Oriented to  Time, Oriented to Situation Alcohol / Substance Use: Tobacco Use, Alcohol Use, Illicit Drugs (has been drinking 2- 5th of alcohol a day) Psych Involvement: No (comment)  Admission diagnosis:  Alcohol dependence with withdrawal delirium ([place]) [F10.231] Alcohol withdrawal syndrome with perceptual disturbance ([place]) [F10.932] Patient Active Problem List   Diagnosis Date Noted   Alcohol dependence with withdrawal delirium ([place]) 09/02/2021   Vitamin D deficiency 03/13/2021   Microhematuria 03/13/2021   Positive blood cultures    Asthma exacerbation 07/22/2020   Hyperkalemia 07/22/2020   PAF (paroxysmal atrial fibrillation) ([place]) 04/01/2020   Atrial flutter with rapid ventricular response ([place])  04/01/2020   Alcohol dependence with withdrawal ([place]) 12/19/2019   History of pulmonary embolism 12/19/2019   Pneumonia due to COVID-19 virus 02/10/2019   Pulmonary embolism ([place]) 07/06/2018   Leukocytosis 09/22/2017   Rash 09/22/2017   Sepsis ([place]) 09/12/2016   Colovesical fistula  09/12/2016   Alcohol abuse 07/28/2016   Colonic diverticulum 07/28/2016   Hyperlipidemia 07/10/2016   Mass of left side of neck 07/10/2016   Head and neck lymphadenopathy 07/10/2016   Eustachian tube disorder 01/25/2016   Peripheral edema 03/16/2015   Asthma 03/16/2015   Right shoulder pain 03/16/2015   Hypokalemia 10/04/2014   Upper airway cough syndrome 10/03/2014   Obesity, Class III, BMI 40-49.9 (morbid obesity) ([place]) 10/03/2014   Lower back pain 05/25/2014   Bilateral shoulder pain 05/25/2014   Allergic rhinitis 12/06/2013   Impaired glucose tolerance 12/06/2013   Expected blood loss anemia 12/16/2012   Morbid obesity ([place]) 12/15/2012   S/P left TKA 12/14/2012   Goiter 11/26/2012   Preop exam for internal medicine 11/26/2012   Vaginal bleeding 04/30/2011   Colon polyps 02/10/2011   Eczema 02/10/2011   Depression 02/10/2011   Encounter for well adult exam with abnormal findings 09/27/2010   Anxiety state 04/05/2010   DIVERTICULITIS, HX OF 04/05/2010   PALPITATIONS, HX OF 09/14/2007   Essential hypertension 04/24/2007   VOCAL CORD DISORDER 04/24/2007   Extrinsic asthma 04/24/2007   GERD 04/24/2007   PCP:  Biagio Borg, MD Pharmacy:   [id number] [place] [place]Phone: [phone]: [phone] [facility] [address]Phone: [phone]: [address] [place]Phone: [phone]: [phone]    Social Determinants of Health (SDOH) Interventions    Readmission Risk Interventions     No data to display

## 2021-09-06 NOTE — Consult Note (Signed)
Date of Admission:  09/02/2021          Reason for Consult: E. coli bacteremia which was diagnosed more than 3 days after hospital admission   Referring Provider: [facility] "Auto consult" and Dr. Aline August, MD   Assessment:  E. coli bacteremia secondary to urinary source with extensive stones and patient with colovesicular and colovaginal fistula Obstructive uropathy with renal failure that is likely partly prerenal in addition to being obstructive Alcohol withdrawal Hepatomegaly Morbid obesity Paroxysmal atrial fibrillation  Plan:  Narrow to cefazolin Follow-up blood culture sensitivity data Repeat blood cultures She will need a prolonged course of oral antibiotics once we stepdown to them given the placement of stents that occurred yesterday  I will follow-up on her culture data and adjust antibiotics accordingly and I am available for questions this weekend Dr. Juleen China will take over the service on Monday.  Principal Problem:   E coli bacteremia Active Problems:   Essential hypertension   Morbid obesity (HCC)   Asthma   Hyperlipidemia   Leukocytosis   History of pulmonary embolism   PAF (paroxysmal atrial fibrillation) (HCC)   Hyperkalemia   Alcohol dependence with withdrawal delirium (HCC)   AKI (acute kidney injury) ([place])   Right ureteral stone   Hydronephrosis of right kidney   Acute pyelonephritis   Scheduled Meds:  apixaban  5 mg Oral BID   atorvastatin  20 mg Oral Daily   Chlorhexidine Gluconate Cloth  6 each Topical Daily   docusate sodium  100 mg Oral BID   folic acid  1 mg Oral Daily   LORazepam  0-4 mg Intravenous Q8H   LORazepam  0-4 mg Intravenous Q8H   metoprolol succinate  25 mg Oral Daily   multivitamin with minerals  1 tablet Oral Daily   pantoprazole  40 mg Oral Daily   sodium chloride flush  3 mL Intravenous Q12H   thiamine  100 mg Oral Daily   Continuous Infusions:  cefTRIAXone (ROCEPHIN)  IV Stopped (09/06/21 0538)   lactated  ringers 125 mL/hr at 09/06/21 0702   PRN Meds:.acetaminophen **OR** acetaminophen, albuterol, bisacodyl, hydrALAZINE, ondansetron **OR** ondansetron (ZOFRAN) IV, oxyCODONE, polyethylene glycol, Polyethyl Glycol-Propyl Glycol  HPI: [name] is a 61 y.o. female with known colovaginal colovesicular fistula prior problems of bladder and colonic abscesses paroxysmal atrial fibrillation hypertension morbid obesity and alcohol abuse who presented to the ER with tremors and hallucinations.  She was initially admitted and managed for alcohol withdrawal.  However it turns out that she was having involving sepsis due to to a urinary tract infection.  She also had acute kidney injury and was found on ultrasound to have obstructive uropathy with right-sided ureteral lithiasis and hydronephrosis.  Urine cultures were taken and blood cultures were taken which are both growing E. coli.  She had initially been started on ceftriaxone 1 g which was escalated to 2 g.  Urology have seen the patient in transfer had her transferred to [facility] for cystoscopy and urgent stenting.  Yesterday Dr. Abner Greenspan took her to the operating room and placed perform cystoscopy with right retrograde pyelogram and interpretation as well as right ureteral stent placement.  I will narrow to cefazolin and will repeat blood cultures.  I will follow up on sensitivities from blood cultures.  Once she is stabilized with IV antibiotics will build to stepdown to oral antibiotics but would give her more than the typical 7 days given the stents that are in place.  I spent 83 minutes with the patient including than 50% of the time in face to face counseling of the patient guarding her infected stones with bacteremia her colovesicular and colovaginal fistula personally reviewing T abdomen pelvis along with review of medical records in preparation for the visit and during the visit and in coordination of her care.    Review of  Systems: Review of Systems  Constitutional:  Positive for malaise/fatigue. Negative for chills, diaphoresis, fever and weight loss.  HENT:  Negative for congestion, hearing loss, sore throat and tinnitus.   Eyes:  Negative for blurred vision and double vision.  Respiratory:  Negative for cough, sputum production, shortness of breath and wheezing.   Cardiovascular:  Negative for chest pain, palpitations and leg swelling.  Gastrointestinal:  Negative for abdominal pain, blood in stool, constipation, diarrhea, heartburn, melena, nausea and vomiting.  Genitourinary:  Positive for dysuria and flank pain. Negative for hematuria.  Musculoskeletal:  Positive for myalgias. Negative for back pain, falls and joint pain.  Skin:  Negative for itching and rash.  Neurological:  Negative for dizziness, sensory change, focal weakness, loss of consciousness, weakness and headaches.  Endo/Heme/Allergies:  Does not bruise/bleed easily.  Psychiatric/Behavioral:  Positive for substance abuse. Negative for depression, memory loss and suicidal ideas. The patient is nervous/anxious.     Past Medical History:  Diagnosis Date   Abscess of bladder 07/28/2016   Alcohol dependence ([place])    Allergic rhinitis 12/06/2013   Asthma 03/16/2015   Atrial flutter with rapid ventricular response ([place]) 04/01/2020   COLONIC POLYPS, HX OF 04/05/2010   DIVERTICULITIS, HX OF 04/05/2010   DJD (degenerative joint disease)    right knee, mot to severe   GERD (gastroesophageal reflux disease)    no meds   Heart murmur    hx of    Hyperlipidemia    Hypertension    Impaired glucose tolerance 12/06/2013   Morbid obesity with BMI of 50.0-59.9, adult ([place])     Social History   Tobacco Use   Smoking status: Former    Packs/day: 0.50    Years: 7.00    Total pack years: 3.50    Types: Cigarettes    Quit date: 02/11/1988    Years since quitting: 33.5   Smokeless tobacco: Never  Vaping Use   Vaping Use: Never used  Substance  Use Topics   Alcohol use: Yes    Comment: up to 1/2 gallon vodka per day   Drug use: Not Currently    Types: Marijuana    Comment: smoked marijuana x 10 years.  Quit in 1985.    Family History  Problem Relation Age of Onset   Stroke Mother    COPD Father    Lymphoma Sister    Alcoholism Sister    Allergies  Allergen Reactions   Morphine Itching   Shrimp [Shellfish Allergy] Itching and Other (See Comments)    Tongue burns also   Tramadol Other (See Comments)    Caused confusion   Covid-19 (Mrna) Vaccine Hives   Diltiazem Hcl Itching    Pt with itching of the feet when bolus given   Other Other (See Comments)   Latex Rash    OBJECTIVE: Blood pressure 134/84, pulse (!) 102, temperature 98.2 F (36.8 C), temperature source Oral, resp. rate 18, height '5\' 1"'$  (1.549 m), weight 135 kg, SpO2 98 %.  Physical Exam Constitutional:      General: She is not in acute distress.    Appearance: Normal appearance.  She is well-developed. She is not ill-appearing or diaphoretic.  HENT:     Head: Normocephalic and atraumatic.     Right Ear: Hearing and external ear normal.     Left Ear: Hearing and external ear normal.     Nose: No nasal deformity or rhinorrhea.  Eyes:     General: No scleral icterus.    Conjunctiva/sclera: Conjunctivae normal.     Right eye: Right conjunctiva is not injected.     Left eye: Left conjunctiva is not injected.     Pupils: Pupils are equal, round, and reactive to light.  Neck:     Vascular: No JVD.  Cardiovascular:     Rate and Rhythm: Regular rhythm. Tachycardia present.     Heart sounds: Normal heart sounds, S1 normal and S2 normal. No murmur heard.    No friction rub.  Pulmonary:     Effort: Pulmonary effort is normal.     Breath sounds: No wheezing.  Abdominal:     General: Bowel sounds are normal. There is no distension.     Palpations: Abdomen is soft. There is no mass.     Tenderness: There is abdominal tenderness.  Musculoskeletal:         General: Normal range of motion.     Right shoulder: Normal.     Left shoulder: Normal.     Cervical back: Normal range of motion and neck supple.     Right hip: Normal.     Left hip: Normal.     Right knee: Normal.     Left knee: Normal.  Lymphadenopathy:     Head:     Right side of head: No submandibular, preauricular or posterior auricular adenopathy.     Left side of head: No submandibular, preauricular or posterior auricular adenopathy.     Cervical: No cervical adenopathy.     Right cervical: No superficial or deep cervical adenopathy.    Left cervical: No superficial or deep cervical adenopathy.  Skin:    General: Skin is warm and dry.     Coloration: Skin is not pale.     Findings: No abrasion, bruising, ecchymosis, erythema, lesion or rash.     Nails: There is no clubbing.  Neurological:     General: No focal deficit present.     Mental Status: She is alert and oriented to person, place, and time.     Sensory: No sensory deficit.     Coordination: Coordination normal.     Gait: Gait normal.  Psychiatric:        Attention and Perception: She is attentive.        Mood and Affect: Mood normal.        Speech: Speech normal.        Behavior: Behavior normal. Behavior is cooperative.        Thought Content: Thought content normal.        Judgment: Judgment normal.     Lab Results Lab Results  Component Value Date   WBC 29.4 (H) 09/06/2021   HGB 11.4 (L) 09/06/2021   HCT 36.4 09/06/2021   MCV 95.5 09/06/2021   PLT 157 09/06/2021    Lab Results  Component Value Date   CREATININE 5.70 (H) 09/06/2021   BUN 60 (H) 09/06/2021   NA 135 09/06/2021   K 6.6 (HH) 09/06/2021   CL 104 09/06/2021   CO2 22 09/06/2021    Lab Results  Component Value Date   ALT 46 (H) 09/04/2021  AST 58 (H) 09/04/2021   ALKPHOS 119 09/04/2021   BILITOT 0.7 09/04/2021     Microbiology: Recent Results (from the past 240 hour(s))  Urine Culture     Status: Abnormal   Collection Time:  09/04/21  5:16 PM   Specimen: Urine, Clean Catch  Result Value Ref Range Status   Specimen Description URINE, CLEAN CATCH  Final   Special Requests   Final    NONE Performed at [facility], [place] [address], [place], [place] [address]    Culture 50,000 COLONIES/mL ESCHERICHIA COLI (A)  Final   Report Status 09/06/2021 FINAL  Final   Organism ID, Bacteria ESCHERICHIA COLI (A)  Final      Susceptibility   Escherichia coli - MIC*    AMPICILLIN >=32 RESISTANT Resistant     CEFAZOLIN <=4 SENSITIVE Sensitive     CEFEPIME <=0.12 SENSITIVE Sensitive     CEFTRIAXONE <=0.25 SENSITIVE Sensitive     CIPROFLOXACIN <=0.25 SENSITIVE Sensitive     GENTAMICIN <=1 SENSITIVE Sensitive     IMIPENEM <=0.25 SENSITIVE Sensitive     NITROFURANTOIN <=16 SENSITIVE Sensitive     TRIMETH/SULFA <=20 SENSITIVE Sensitive     AMPICILLIN/SULBACTAM 16 INTERMEDIATE Intermediate     PIP/TAZO <=4 SENSITIVE Sensitive     * 50,000 COLONIES/mL ESCHERICHIA COLI  Culture, blood (Routine X 2) w Reflex to ID Panel     Status: None (Preliminary result)   Collection Time: 09/04/21  9:09 PM   Specimen: BLOOD  Result Value Ref Range Status   Specimen Description BLOOD RIGHT ANTECUBITAL  Final   Special Requests   Final    BOTTLES DRAWN AEROBIC AND ANAEROBIC Blood Culture adequate volume   Culture  Setup Time   Final    GRAM NEGATIVE RODS CRITICAL RESULT CALLED TO, READ BACK BY AND VERIFIED WITH: PHARMD AUSTIN PAYTES [id number] PM BY EC Performed at [facility], [address] [place], [place] [address]    Culture GRAM NEGATIVE RODS  Final   Report Status PENDING  Incomplete  Culture, blood (Routine X 2) w Reflex to ID Panel     Status: Abnormal (Preliminary result)   Collection Time: 09/04/21  9:09 PM   Specimen: BLOOD LEFT HAND  Result Value Ref Range Status   Specimen Description BLOOD LEFT HAND  Final   Special Requests   Final    BOTTLES DRAWN AEROBIC AND ANAEROBIC Blood Culture adequate volume    Culture  Setup Time   Final    GRAM NEGATIVE RODS ANAEROBIC BOTTLE ONLY Organism ID to follow CRITICAL RESULT CALLED TO, READ BACK BY AND VERIFIED WITH: PHARMD AUSTIN PAYTES [id number] 6387 PM BY EC Performed at [facility], [place] [address] [place], [place] [address]    Culture ESCHERICHIA COLI (A)  Final   Report Status PENDING  Incomplete  Blood Culture ID Panel (Reflexed)     Status: Abnormal   Collection Time: 09/04/21  9:09 PM  Result Value Ref Range Status   Enterococcus faecalis NOT DETECTED NOT DETECTED Final   Enterococcus Faecium NOT DETECTED NOT DETECTED Final   Listeria monocytogenes NOT DETECTED NOT DETECTED Final   Staphylococcus species NOT DETECTED NOT DETECTED Final   Staphylococcus aureus (BCID) NOT DETECTED NOT DETECTED Final   Staphylococcus epidermidis NOT DETECTED NOT DETECTED Final   Staphylococcus lugdunensis NOT DETECTED NOT DETECTED Final   Streptococcus species NOT DETECTED NOT DETECTED Final   Streptococcus agalactiae NOT DETECTED NOT DETECTED Final  Streptococcus pneumoniae NOT DETECTED NOT DETECTED Final   Streptococcus pyogenes NOT DETECTED NOT DETECTED Final   A.calcoaceticus-baumannii NOT DETECTED NOT DETECTED Final   Bacteroides fragilis NOT DETECTED NOT DETECTED Final   Enterobacterales DETECTED (A) NOT DETECTED Final    Comment: Enterobacterales represent a large order of gram negative bacteria, not a single organism.   Enterobacter cloacae complex NOT DETECTED NOT DETECTED Final   Escherichia coli DETECTED (A) NOT DETECTED Final    Comment: CRITICAL RESULT CALLED TO, READ BACK BY AND VERIFIED WITH: PHARMD [place] PAYTES [id number] 5277 PM BY EC    Klebsiella aerogenes NOT DETECTED NOT DETECTED Final   Klebsiella oxytoca NOT DETECTED NOT DETECTED Final   Klebsiella pneumoniae NOT DETECTED NOT DETECTED Final   Proteus species NOT DETECTED NOT DETECTED Final   Salmonella species NOT DETECTED NOT DETECTED Final   Serratia marcescens NOT DETECTED  NOT DETECTED Final   Haemophilus influenzae NOT DETECTED NOT DETECTED Final   Neisseria meningitidis NOT DETECTED NOT DETECTED Final   Pseudomonas aeruginosa NOT DETECTED NOT DETECTED Final   Stenotrophomonas maltophilia NOT DETECTED NOT DETECTED Final   Candida albicans NOT DETECTED NOT DETECTED Final   Candida auris NOT DETECTED NOT DETECTED Final   Candida glabrata NOT DETECTED NOT DETECTED Final   Candida krusei NOT DETECTED NOT DETECTED Final   Candida parapsilosis NOT DETECTED NOT DETECTED Final   Candida tropicalis NOT DETECTED NOT DETECTED Final   Cryptococcus neoformans/gattii NOT DETECTED NOT DETECTED Final   CTX-M ESBL NOT DETECTED NOT DETECTED Final   Carbapenem resistance IMP NOT DETECTED NOT DETECTED Final   Carbapenem resistance KPC NOT DETECTED NOT DETECTED Final   Carbapenem resistance NDM NOT DETECTED NOT DETECTED Final   Carbapenem resist OXA 48 LIKE NOT DETECTED NOT DETECTED Final   Carbapenem resistance VIM NOT DETECTED NOT DETECTED Final    Comment: Performed at [facility], [address], [place], [place] [address]    Alcide Evener, [facility] [phone] pager  09/06/2021, 10:58 AM

## 2021-09-07 DIAGNOSIS — R7881 Bacteremia: Secondary | ICD-10-CM | POA: Diagnosis not present

## 2021-09-07 DIAGNOSIS — N179 Acute kidney failure, unspecified: Secondary | ICD-10-CM | POA: Diagnosis not present

## 2021-09-07 DIAGNOSIS — N10 Acute tubulo-interstitial nephritis: Secondary | ICD-10-CM

## 2021-09-07 DIAGNOSIS — D72829 Elevated white blood cell count, unspecified: Secondary | ICD-10-CM

## 2021-09-07 DIAGNOSIS — F10231 Alcohol dependence with withdrawal delirium: Secondary | ICD-10-CM | POA: Diagnosis not present

## 2021-09-07 LAB — RENAL FUNCTION PANEL
Albumin: 2.6 g/dL — ABNORMAL LOW (ref 3.5–5.0)
Anion gap: 6 (ref 5–15)
BUN: 54 mg/dL — ABNORMAL HIGH (ref 6–20)
CO2: 26 mmol/L (ref 22–32)
Calcium: 8.9 mg/dL (ref 8.9–10.3)
Chloride: 111 mmol/L (ref 98–111)
Creatinine, Ser: 3.01 mg/dL — ABNORMAL HIGH (ref 0.44–1.00)
GFR, Estimated: 17 mL/min — ABNORMAL LOW (ref >60)
Glucose, Bld: 124 mg/dL — ABNORMAL HIGH (ref 70–99)
Phosphorus: 3.7 mg/dL (ref 2.5–4.6)
Potassium: 4.5 mmol/L (ref 3.5–5.1)
Sodium: 143 mmol/L (ref 135–145)

## 2021-09-07 LAB — CBC
HCT: 32.7 % — ABNORMAL LOW (ref 36.0–46.0)
Hemoglobin: 10.2 g/dL — ABNORMAL LOW (ref 12.0–15.0)
MCH: 29.9 pg (ref 26.0–34.0)
MCHC: 31.2 g/dL (ref 30.0–36.0)
MCV: 95.9 fL (ref 80.0–100.0)
Platelets: 154 10*3/uL (ref 150–400)
RBC: 3.41 MIL/uL — ABNORMAL LOW (ref 3.87–5.11)
RDW: 15.4 % (ref 11.5–15.5)
WBC: 18.5 10*3/uL — ABNORMAL HIGH (ref 4.0–10.5)
nRBC: 0.2 % (ref 0.0–0.2)

## 2021-09-07 LAB — CULTURE, BLOOD (ROUTINE X 2)
Special Requests: ADEQUATE
Special Requests: ADEQUATE

## 2021-09-07 LAB — URINE CULTURE: Culture: 50000 — AB

## 2021-09-07 LAB — MAGNESIUM: Magnesium: 1.9 mg/dL (ref 1.7–2.4)

## 2021-09-07 MED ORDER — CEFAZOLIN SODIUM-DEXTROSE 2-4 GM/100ML-% IV SOLN
2.0000 g | Freq: Two times a day (BID) | INTRAVENOUS | Status: DC
  Administered 2021-09-07 – 2021-09-08 (×2): 2 g via INTRAVENOUS
  Filled 2021-09-07 (×2): qty 100

## 2021-09-07 MED ORDER — SODIUM CHLORIDE 0.45 % IV SOLN: INTRAVENOUS | Status: DC

## 2021-09-07 NOTE — Progress Notes (Signed)
       Date: 09/07/2021  Patient name: [name]  Medical record number: [id number]  Date of birth: 1960/12/09    61 year old lady admitted with what was thought to be largely alcohol withdrawal with acute renal failure but found to have postobstructive uropathy due to stone status postplacement of stents but who also was found to have complicated urinary tract infection with bacteremia with E. coli.  E. coli is fairly sensitive and we have switched her to cefazolin as she improves can stepdown to oral cefadroxil renally adjusted.  Would ensure that she gets at least 14 days of total therapy given that she has stents that were placed.  We will sign off for now please call with further questions.   Alcide Evener 09/07/2021, 12:53 PM

## 2021-09-07 NOTE — Progress Notes (Signed)
PROGRESS NOTE    [name]  DSK:[id number] DOB: 03/16/1960 DOA: 09/02/2021 PCP: Biagio Borg, MD   Brief Narrative:  61 year old female with history of colovaginal fistula in the past, prior bladder and colonic abscess with pelvic drainage in 2018, paroxysmal A-fib on Eliquis, mild intermittent asthma, hypertension, hyperlipidemia, alcohol abuse presented with tremors and hallucinations and was initially admitted for alcohol withdrawal.  She was also subsequently found to have severe sepsis with acute kidney injury with obstructive uropathy/pyelonephritis/right ureterolithiasis with hydronephrosis and E. coli bacteremia and started on IV fluids and antibiotics.  Creatinine worsened to 8.6 from a baseline of 1.  Urology and nephrology were consulted.  She was transferred to [facility] hospital from [facility] per urology recommendations.  She underwent cystoscopy and stenting on 09/05/2021.  Assessment & Plan:   Severe sepsis Acute kidney injury UTI/pyelonephritis/obstructive uropathy/right ureterolithiasis with hydronephrosis E. coli bacteremia -Baseline creatinine of 1.  Presented with creatinine of 3.58 and worsened to 8.6.  Urology and nephrology were consulted.  Treated with IV fluids. -Status post cystoscopy and right ureteral stent on 09/05/2021 by urology.  Urology recommend appropriate antibiotics and will follow up as an outpatient for definitive management management of stone -Currently on Ancef.  ID following.  IV fluids as per nephrology. -Creatinine improving to 3.01 today.  Hyperkalemia -Resolved  Leukocytosis -Improving.  Repeat a.m. labs.  Alcohol withdrawal Alcohol abuse -Continue CIWA protocol.  Continue lorazepam as needed.  Scheduled Valium discontinued.  Continue thiamine, folic acid and multivitamin.  TOC consult  Paroxysmal A-fib -Intermittently tachycardic.  Continue metoprolol succinate and apixaban.  Hyperlipidemia -Continue statin  Morbid  obesity -Outpatient follow-up  History of pulmonary embolism -Continue Eliquis  Physical deconditioning -Will need home health PT  DVT prophylaxis: Apixaban Code Status: Full Family Communication: None at bedside Disposition Plan: Status is: Inpatient Remains inpatient appropriate because: Of severity of illness    Consultants: Urology/nephrology/ID  Procedures: Cystoscopy and right ureteral stent placement on 09/05/2021 by urology  Antimicrobials:  Anti-infectives (From admission, onward)    Start     Dose/Rate Route Frequency Ordered Stop   09/06/21 1800  ceFAZolin (ANCEF) IVPB 2g/100 mL premix        2 g 200 mL/hr over 30 Minutes Intravenous Every 12 hours 09/06/21 1113     09/05/21 2000  cefTRIAXone (ROCEPHIN) 2 g in sodium chloride 0.9 % 100 mL IVPB  Status:  Discontinued        2 g 200 mL/hr over 30 Minutes Intravenous Every 24 hours 09/05/21 0614 09/06/21 1106   09/05/21 0715  metroNIDAZOLE (FLAGYL) IVPB 500 mg  Status:  Discontinued        500 mg 100 mL/hr over 60 Minutes Intravenous Every 12 hours 09/05/21 0616 09/05/21 0618   09/04/21 2000  cefTRIAXone (ROCEPHIN) 1 g in sodium chloride 0.9 % 100 mL IVPB  Status:  Discontinued        1 g 200 mL/hr over 30 Minutes Intravenous Every 24 hours 09/04/21 1920 09/05/21 6203       Subjective: Patient seen and examined at bedside.  Awake and answers questions appropriately today.  Feels much better.  No fever or worsening shortness of breath reported.  No seizures, agitation, vomiting reported. Objective: Vitals:   09/06/21 2355 09/07/21 0435 09/07/21 0535 09/07/21 0753  BP: (!) 141/89 129/90    Pulse: (!) 101 92    Resp: (!) 22     Temp: 99 F (37.2 C) 98.7 F (37.1 C)  TempSrc: Oral Oral    SpO2: 94% 98% 95% 96%  Weight:      Height:        Intake/Output Summary (Last 24 hours) at 09/07/2021 0802 Last data filed at 09/07/2021 0600 Gross per 24 hour  Intake 3057.2 ml  Output 1400 ml  Net 1657.2 ml     Filed Weights   09/02/21 0740  Weight: 135 kg    Examination:  General: On 1 L oxygen via nasal cannula.  No distress.  ENT/neck: No thyromegaly.  JVD is not elevated  respiratory: Decreased breath sounds at bases bilaterally with some crackles; no wheezing CVS: S1-S2 heard, rate controlled Abdominal: Soft, morbidly obese, nontender, slightly distended; no organomegaly,  bowel sounds are heard Extremities: Trace lower extremity edema; no cyanosis  CNS:  Awake and answers questions appropriately today. No focal neurologic deficit.  Moves extremities Lymph: No obvious lymphadenopathy Skin: No obvious ecchymosis/lesions  psych: No signs of agitation currently.  Mood and affect normal.   Musculoskeletal: No obvious joint swelling/deformity    Data Reviewed: I have personally reviewed following labs and imaging studies  CBC: Recent Labs  Lab 09/03/21 0321 09/04/21 0704 09/04/21 2109 09/05/21 2139 09/06/21 0353 09/07/21 0401  WBC 11.5* 20.8* 28.2*  --  29.4* 18.5*  NEUTROABS  --   --  26.2* 26.6*  --   --   HGB 11.4* 11.2* 11.2*  --  11.4* 10.2*  HCT 34.1* 34.0* 34.5*  --  36.4 32.7*  MCV 91.2 90.9 92.5  --  95.5 95.9  PLT 231 215 188  --  157 485    Basic Metabolic Panel: Recent Labs  Lab 09/04/21 2109 09/05/21 1157 09/06/21 0353 09/06/21 0742 09/06/21 0752 09/06/21 1526 09/07/21 0401  NA 132* 133* 140 135  --   --  143  K 5.2* 5.1 6.8* 6.6*  --  5.2* 4.5  CL 100 100 105 104  --   --  111  CO2 19* '22 22 22  '$ --   --  26  GLUCOSE 134* 96 114* 94  --   --  124*  BUN 53* 55* 63* 60*  --   --  54*  CREATININE 8.22* 8.64* 6.50* 5.70*  --   --  3.01*  CALCIUM 8.7* 8.9 9.1 8.8*  --   --  8.9  MG  --   --   --   --  2.0  --  1.9  PHOS  --  4.1  --  6.3*  --   --  3.7    GFR: Estimated Creatinine Clearance: 25.9 mL/min (A) (by C-G formula based on SCr of 3.01 mg/dL (H)). Liver Function Tests: Recent Labs  Lab 09/02/21 0753 09/04/21 2109 09/05/21 1157  09/06/21 0742 09/07/21 0401  AST 67* 58*  --   --   --   ALT 58* 46*  --   --   --   ALKPHOS 129* 119  --   --   --   BILITOT 0.5 0.7  --   --   --   PROT 7.3 6.6  --   --   --   ALBUMIN 3.0* 2.7* 2.7* 2.9* 2.6*    Recent Labs  Lab 09/02/21 0753  LIPASE 24    No results for input(s): "AMMONIA" in the last 168 hours. Coagulation Profile: No results for input(s): "INR", "PROTIME" in the last 168 hours. Cardiac Enzymes: Recent Labs  Lab 09/05/21 0950  CKTOTAL 426*    BNP (  last 3 results) No results for input(s): "PROBNP" in the last 8760 hours. HbA1C: No results for input(s): "HGBA1C" in the last 72 hours. CBG: Recent Labs  Lab 09/05/21 1939  GLUCAP 134*    Lipid Profile: No results for input(s): "CHOL", "HDL", "LDLCALC", "TRIG", "CHOLHDL", "LDLDIRECT" in the last 72 hours. Thyroid Function Tests: No results for input(s): "TSH", "T4TOTAL", "FREET4", "T3FREE", "THYROIDAB" in the last 72 hours. Anemia Panel: No results for input(s): "VITAMINB12", "FOLATE", "FERRITIN", "TIBC", "IRON", "RETICCTPCT" in the last 72 hours. Sepsis Labs: Recent Labs  Lab 09/04/21 2109  PROCALCITON 57.93     Recent Results (from the past 240 hour(s))  Urine Culture     Status: Abnormal   Collection Time: 09/04/21  5:16 PM   Specimen: Urine, Clean Catch  Result Value Ref Range Status   Specimen Description URINE, CLEAN CATCH  Final   Special Requests   Final    NONE Performed at [facility], [place] [address], [place], [place] [address]    Culture 50,000 COLONIES/mL ESCHERICHIA COLI (A)  Final   Report Status 09/06/2021 FINAL  Final   Organism ID, Bacteria ESCHERICHIA COLI (A)  Final      Susceptibility   Escherichia coli - MIC*    AMPICILLIN >=32 RESISTANT Resistant     CEFAZOLIN <=4 SENSITIVE Sensitive     CEFEPIME <=0.12 SENSITIVE Sensitive     CEFTRIAXONE <=0.25 SENSITIVE Sensitive     CIPROFLOXACIN <=0.25 SENSITIVE Sensitive     GENTAMICIN <=1 SENSITIVE Sensitive      IMIPENEM <=0.25 SENSITIVE Sensitive     NITROFURANTOIN <=16 SENSITIVE Sensitive     TRIMETH/SULFA <=20 SENSITIVE Sensitive     AMPICILLIN/SULBACTAM 16 INTERMEDIATE Intermediate     PIP/TAZO <=4 SENSITIVE Sensitive     * 50,000 COLONIES/mL ESCHERICHIA COLI  Culture, blood (Routine X 2) w Reflex to ID Panel     Status: None (Preliminary result)   Collection Time: 09/04/21  9:09 PM   Specimen: BLOOD  Result Value Ref Range Status   Specimen Description BLOOD RIGHT ANTECUBITAL  Final   Special Requests   Final    BOTTLES DRAWN AEROBIC AND ANAEROBIC Blood Culture adequate volume   Culture  Setup Time   Final    GRAM NEGATIVE RODS CRITICAL RESULT CALLED TO, READ BACK BY AND VERIFIED WITH: PHARMD AUSTIN PAYTES [id number] PM BY EC Performed at [facility], [address], [place], [place] [address]    Culture GRAM NEGATIVE RODS  Final   Report Status PENDING  Incomplete  Culture, blood (Routine X 2) w Reflex to ID Panel     Status: Abnormal (Preliminary result)   Collection Time: 09/04/21  9:09 PM   Specimen: BLOOD LEFT HAND  Result Value Ref Range Status   Specimen Description BLOOD LEFT HAND  Final   Special Requests   Final    BOTTLES DRAWN AEROBIC AND ANAEROBIC Blood Culture adequate volume   Culture  Setup Time   Final    GRAM NEGATIVE RODS ANAEROBIC BOTTLE ONLY Organism ID to follow CRITICAL RESULT CALLED TO, READ BACK BY AND VERIFIED WITH: PHARMD AUSTIN PAYTES [id number] PM BY EC Performed at [facility], [place] [address], [place], [place] [address]    Culture ESCHERICHIA COLI (A)  Final   Report Status PENDING  Incomplete  Blood Culture ID Panel (Reflexed)     Status: Abnormal   Collection Time: 09/04/21  9:09 PM  Result Value Ref Range Status  Enterococcus faecalis NOT DETECTED NOT DETECTED Final   Enterococcus Faecium NOT DETECTED NOT DETECTED Final   Listeria monocytogenes NOT DETECTED NOT DETECTED Final   Staphylococcus species NOT DETECTED NOT DETECTED  Final   Staphylococcus aureus (BCID) NOT DETECTED NOT DETECTED Final   Staphylococcus epidermidis NOT DETECTED NOT DETECTED Final   Staphylococcus lugdunensis NOT DETECTED NOT DETECTED Final   Streptococcus species NOT DETECTED NOT DETECTED Final   Streptococcus agalactiae NOT DETECTED NOT DETECTED Final   Streptococcus pneumoniae NOT DETECTED NOT DETECTED Final   Streptococcus pyogenes NOT DETECTED NOT DETECTED Final   A.calcoaceticus-baumannii NOT DETECTED NOT DETECTED Final   Bacteroides fragilis NOT DETECTED NOT DETECTED Final   Enterobacterales DETECTED (A) NOT DETECTED Final    Comment: Enterobacterales represent a large order of gram negative bacteria, not a single organism.   Enterobacter cloacae complex NOT DETECTED NOT DETECTED Final   Escherichia coli DETECTED (A) NOT DETECTED Final    Comment: CRITICAL RESULT CALLED TO, READ BACK BY AND VERIFIED WITH: PHARMD AUSTIN PAYTES [id number] PM BY EC    Klebsiella aerogenes NOT DETECTED NOT DETECTED Final   Klebsiella oxytoca NOT DETECTED NOT DETECTED Final   Klebsiella pneumoniae NOT DETECTED NOT DETECTED Final   Proteus species NOT DETECTED NOT DETECTED Final   Salmonella species NOT DETECTED NOT DETECTED Final   Serratia marcescens NOT DETECTED NOT DETECTED Final   Haemophilus influenzae NOT DETECTED NOT DETECTED Final   Neisseria meningitidis NOT DETECTED NOT DETECTED Final   Pseudomonas aeruginosa NOT DETECTED NOT DETECTED Final   Stenotrophomonas maltophilia NOT DETECTED NOT DETECTED Final   Candida albicans NOT DETECTED NOT DETECTED Final   Candida auris NOT DETECTED NOT DETECTED Final   Candida glabrata NOT DETECTED NOT DETECTED Final   Candida krusei NOT DETECTED NOT DETECTED Final   Candida parapsilosis NOT DETECTED NOT DETECTED Final   Candida tropicalis NOT DETECTED NOT DETECTED Final   Cryptococcus neoformans/gattii NOT DETECTED NOT DETECTED Final   CTX-M ESBL NOT DETECTED NOT DETECTED Final   Carbapenem resistance  IMP NOT DETECTED NOT DETECTED Final   Carbapenem resistance KPC NOT DETECTED NOT DETECTED Final   Carbapenem resistance NDM NOT DETECTED NOT DETECTED Final   Carbapenem resist OXA 48 LIKE NOT DETECTED NOT DETECTED Final   Carbapenem resistance VIM NOT DETECTED NOT DETECTED Final    Comment: Performed at [facility], [address] [place], [place] [address]  Urine Culture     Status: Abnormal (Preliminary result)   Collection Time: 09/05/21  7:16 PM   Specimen: Urine, Cystoscope  Result Value Ref Range Status   Specimen Description   Final    URINE, RANDOM Performed at [facility], [place] [address], [place], [place] [address]    Special Requests   Final    NONE RIGHT RENAL PELVIS URINE CULTURE Performed at [place] [address] [place],  [address]    Culture (A)  Final    50,000 COLONIES/mL ESCHERICHIA COLI SUSCEPTIBILITIES TO FOLLOW Performed at [facility], [place] [address], [place],  [address]    Report Status PENDING  Incomplete         Radiology Studies: DG C-Arm 1-60 Min-No Report  Result Date: 09/05/2021 Fluoroscopy was utilized by the requesting physician.  No radiographic interpretation.   CT ABDOMEN PELVIS WO CONTRAST  Result Date: 09/05/2021 CLINICAL DATA:  Sepsis, history of colovaginal fistula EXAM: CT ABDOMEN AND PELVIS WITHOUT CONTRAST TECHNIQUE: Multidetector CT imaging of the  abdomen and pelvis was performed following the standard protocol without IV contrast. RADIATION DOSE REDUCTION: This exam was performed according to the departmental dose-optimization program which includes automated exposure control, adjustment of the mA and/or kV according to patient size and/or use of iterative reconstruction technique. COMPARISON:  CT 06/13/2021 FINDINGS: Lower chest: Mild cardiomegaly. Aortic valve calcifications. Mild aortic atherosclerotic calcification. No pericardial effusion. Respiratory motion  obscures detail in the lungs. Unchanged subcarinal lymph node complex measuring 2.5 x 1.9 cm. Hepatobiliary: Marked hepatic steatosis. Multiple enhancing liver lesions unchanged from prior and likely representing hepatic hemangiomas. No biliary ductal dilation. No cholelithiasis. Pancreas: Unremarkable. No pancreatic ductal dilatation or surrounding inflammatory changes. Spleen: Normal in size without focal abnormality. Adrenals/Urinary Tract: Adrenal glands are unremarkable. Nonobstructing calyceal stones measuring 2-3 mm in both kidneys. New 6 mm stone in the proximal right ureter mild upstream hydronephrosis and hydroureter slightly increased from 06/13/2021. Previously 2 mm stone at the right UVJ and 1.5 cm stone at the left UPJ are no longer visualized. The evaluation for pyelonephritis is limited without IV contrast. No significant perinephric stranding. The bladder is decompressed about a Foley catheter. Locules of gas in the bladder likely related to Foley catheter placement. Stomach/Bowel: Stomach is within normal limits. The appendix is normal. No evidence of bowel wall thickening, distention, or inflammatory changes. Colonic diverticulosis without diverticulitis. The sigmoid colon again appears tethered along the left aspect of the bladder near the tract of the prior colovaginal fistula. It remains indeterminate whether the fistula is patent. Vascular/Lymphatic: Aortic atherosclerotic calcification. No enlarged abdominal or pelvic lymph nodes. Reproductive: Hysterectomy. Bandlike soft tissue thickening again extends from the upper left vaginal cuff to the left pelvic sidewall and again abuts the sigmoid colon (circa image 60 on series 6). Other: No free intraperitoneal fluid or air. Musculoskeletal: Advanced multilevel bridging anterior osteophytes of the thoracic spine with ankylosis compatible with DISH. IMPRESSION: 1. 6 mm stone in the proximal right ureter with mild upstream hydro nephrosis.  Evaluation for pyelonephritis is limited without IV contrast. 2. Interval placement of a Foley catheter within the decompressed bladder. Gas within the bladder is presumed due to Foley catheter placement. However there is unchanged appearance of the chronic colovaginal/colovesical fistula. There remains indeterminate within the fistula is patent. 3. Enlarged severely steatotic liver.  Stable hemangiomas. Electronically Signed   By: Placido Sou M.D.   On: 09/05/2021 11:52        Scheduled Meds:  albuterol  2 puff Inhalation TID   apixaban  5 mg Oral BID   atorvastatin  20 mg Oral Daily   docusate sodium  100 mg Oral BID   folic acid  1 mg Oral Daily   LORazepam  0-4 mg Intravenous Q8H   metoprolol tartrate  12.5 mg Oral BID   multivitamin with minerals  1 tablet Oral Daily   pantoprazole  40 mg Oral Daily   sodium chloride flush  3 mL Intravenous Q12H   sodium zirconium cyclosilicate  10 g Oral TID   thiamine  100 mg Oral Daily   Continuous Infusions:  sodium chloride 100 mL/hr at 09/07/21 0449    ceFAZolin (ANCEF) IV 2 g (09/07/21 0534)          Aline August, MD [facility] 09/07/2021, 8:02 AM

## 2021-09-07 NOTE — Progress Notes (Signed)
[facility] Progress Note  Subjective: good UOP, creat down to 3.0, K + 4.5. Pt w/o new c/o's.   Vitals:   09/06/21 2355 09/07/21 0435 09/07/21 0535 09/07/21 0753  BP: (!) 141/89 129/90    Pulse: (!) 101 92    Resp: (!) 22     Temp: 99 F (37.2 C) 98.7 F (37.1 C)    TempSrc: Oral Oral    SpO2: 94% 98% 95% 96%  Weight:      Height:        Exam: General: obese, sitting on side Heart: tachy Lungs: poor effort-   Abdomen: obese, non tender Extremities: trace edema to LEs     Assessment/Plan  AKI - suspect due to obstructive uropathy (detected 06/2021), hypovolemia and hemodynamics in the setting of EtOH withdrawal, possible sepsis and ARB exposure.  Renal function was normal in 3/23. Creat was up at 1.76 in 5/23 w/ assoc nephrolithiasis. Creat up here again at 3.95 on presentation. CT showed R hydronephrosis. BCx's and UCx were + for [facility] . Urology stented R ureter on 7/27 and UOP improved greatly w/ improving creat from 8.6 at peak down to 5 yesterday and 3 today. Will lower IVF"s to 65 cc/hr, can taper off as po intake improves.  Suspect AKI will continue to resolve. No further suggestions. Will sign off.  Hyperkalemia - 6.8 at peak, resolved down to 4.7 today. Will resume heart healthy diet.  Alcohol withdrawal - on CIWA protocol EColi urosepsis - w/ R hydro/ stone sp stenting 7/27. Getting IV abx.  Afib - on Eliquis and metoprolol BP/ vol - volume ok on exam, BP's stable  [name] 09/07/2021, 8:40 AM   Recent Labs  Lab 09/06/21 0353 09/06/21 0742 09/06/21 1526 09/07/21 0401  HGB 11.4*  --   --  10.2*  ALBUMIN  --  2.9*  --  2.6*  CALCIUM 9.1 8.8*  --  8.9  PHOS  --  6.3*  --  3.7  CREATININE 6.50* 5.70*  --  3.01*  K 6.8* 6.6* 5.2* 4.5    No results for input(s): "IRON", "TIBC", "FERRITIN" in the last 168 hours. Inpatient medications:  albuterol  2 puff Inhalation TID   apixaban  5 mg Oral BID   atorvastatin  20 mg Oral Daily   docusate  sodium  100 mg Oral BID   folic acid  1 mg Oral Daily   LORazepam  0-4 mg Intravenous Q8H   metoprolol tartrate  12.5 mg Oral BID   multivitamin with minerals  1 tablet Oral Daily   pantoprazole  40 mg Oral Daily   sodium chloride flush  3 mL Intravenous Q12H   sodium zirconium cyclosilicate  10 g Oral TID   thiamine  100 mg Oral Daily    sodium chloride 100 mL/hr at 09/07/21 0449    ceFAZolin (ANCEF) IV 2 g (09/07/21 0534)   acetaminophen **OR** acetaminophen, albuterol, albuterol, bisacodyl, hydrALAZINE, ondansetron **OR** ondansetron (ZOFRAN) IV, oxyCODONE, polyethylene glycol, Polyethyl Glycol-Propyl Glycol

## 2021-09-08 DIAGNOSIS — R7881 Bacteremia: Secondary | ICD-10-CM | POA: Diagnosis not present

## 2021-09-08 DIAGNOSIS — N179 Acute kidney failure, unspecified: Secondary | ICD-10-CM | POA: Diagnosis not present

## 2021-09-08 DIAGNOSIS — N1330 Unspecified hydronephrosis: Secondary | ICD-10-CM

## 2021-09-08 DIAGNOSIS — N201 Calculus of ureter: Secondary | ICD-10-CM

## 2021-09-08 DIAGNOSIS — N10 Acute tubulo-interstitial nephritis: Secondary | ICD-10-CM | POA: Diagnosis not present

## 2021-09-08 DIAGNOSIS — F10231 Alcohol dependence with withdrawal delirium: Secondary | ICD-10-CM | POA: Diagnosis not present

## 2021-09-08 LAB — RENAL FUNCTION PANEL
Albumin: 2.7 g/dL — ABNORMAL LOW (ref 3.5–5.0)
Anion gap: 6 (ref 5–15)
BUN: 38 mg/dL — ABNORMAL HIGH (ref 6–20)
CO2: 26 mmol/L (ref 22–32)
Calcium: 8.7 mg/dL — ABNORMAL LOW (ref 8.9–10.3)
Chloride: 111 mmol/L (ref 98–111)
Creatinine, Ser: 2.02 mg/dL — ABNORMAL HIGH (ref 0.44–1.00)
GFR, Estimated: 28 mL/min — ABNORMAL LOW (ref >60)
Glucose, Bld: 107 mg/dL — ABNORMAL HIGH (ref 70–99)
Phosphorus: 2.8 mg/dL (ref 2.5–4.6)
Potassium: 4.1 mmol/L (ref 3.5–5.1)
Sodium: 143 mmol/L (ref 135–145)

## 2021-09-08 LAB — CBC
HCT: 31.9 % — ABNORMAL LOW (ref 36.0–46.0)
Hemoglobin: 9.9 g/dL — ABNORMAL LOW (ref 12.0–15.0)
MCH: 29.7 pg (ref 26.0–34.0)
MCHC: 31 g/dL (ref 30.0–36.0)
MCV: 95.8 fL (ref 80.0–100.0)
Platelets: 185 10*3/uL (ref 150–400)
RBC: 3.33 MIL/uL — ABNORMAL LOW (ref 3.87–5.11)
RDW: 15.2 % (ref 11.5–15.5)
WBC: 13.5 10*3/uL — ABNORMAL HIGH (ref 4.0–10.5)
nRBC: 0.4 % — ABNORMAL HIGH (ref 0.0–0.2)

## 2021-09-08 LAB — MAGNESIUM: Magnesium: 1.7 mg/dL (ref 1.7–2.4)

## 2021-09-08 MED ORDER — OXYBUTYNIN CHLORIDE 5 MG PO TABS
5.0000 mg | ORAL_TABLET | Freq: Three times a day (TID) | ORAL | Status: DC
  Administered 2021-09-08 – 2021-09-10 (×8): 5 mg via ORAL
  Filled 2021-09-08 (×8): qty 1

## 2021-09-08 MED ORDER — PHENAZOPYRIDINE HCL 100 MG PO TABS: 100.0000 mg | ORAL_TABLET | Freq: Three times a day (TID) | ORAL | Status: DC

## 2021-09-08 MED ORDER — CEFAZOLIN SODIUM-DEXTROSE 2-4 GM/100ML-% IV SOLN
2.0000 g | Freq: Three times a day (TID) | INTRAVENOUS | Status: DC
  Administered 2021-09-08 – 2021-09-10 (×6): 2 g via INTRAVENOUS
  Filled 2021-09-08 (×8): qty 100

## 2021-09-08 MED ORDER — PHENAZOPYRIDINE HCL 100 MG PO TABS
100.0000 mg | ORAL_TABLET | Freq: Once | ORAL | Status: AC
Start: 2021-09-08 — End: 2021-09-08
  Administered 2021-09-08: 100 mg via ORAL
  Filled 2021-09-08: qty 1

## 2021-09-08 MED ORDER — ALPRAZOLAM 0.25 MG PO TABS
0.2500 mg | ORAL_TABLET | Freq: Three times a day (TID) | ORAL | Status: DC | PRN
  Administered 2021-09-08 – 2021-09-10 (×6): 0.25 mg via ORAL
  Filled 2021-09-08 (×6): qty 1

## 2021-09-08 NOTE — Progress Notes (Signed)
PROGRESS NOTE    [name]  BTD:[id number] DOB: 1960-06-04 DOA: 09/02/2021 PCP: Biagio Borg, MD   Brief Narrative:  61 year old female with history of colovaginal fistula in the past, prior bladder and colonic abscess with pelvic drainage in 2018, paroxysmal A-fib on Eliquis, mild intermittent asthma, hypertension, hyperlipidemia, alcohol abuse presented with tremors and hallucinations and was initially admitted for alcohol withdrawal.  She was also subsequently found to have severe sepsis with acute kidney injury with obstructive uropathy/pyelonephritis/right ureterolithiasis with hydronephrosis and E. coli bacteremia and started on IV fluids and antibiotics.  Creatinine worsened to 8.6 from a baseline of 1.  Urology and nephrology were consulted.  She was transferred to [facility] hospital from [facility] per urology recommendations.  She underwent cystoscopy and stenting on 09/05/2021.  ID was also consulted  Assessment & Plan:   Severe sepsis Acute kidney injury UTI/pyelonephritis/obstructive uropathy/right ureterolithiasis with hydronephrosis E. coli bacteremia -Baseline creatinine of 1.  Presented with creatinine of 3.58 and worsened to 8.6.  Urology and nephrology were consulted.  Treated with IV fluids. -Status post cystoscopy and right ureteral stent on 09/05/2021 by urology.  Urology recommend appropriate antibiotics and will follow up as an outpatient for definitive management management of stone -Currently on Ancef.  ID recommended total of 2 weeks course of antibiotic therapy including Augmentin on discharge and signed off on 09/07/2021.  Currently on some gentle IV fluids as per nephrology.  Nephrology signed off on 09/07/2021. -Creatinine improving to 2.02 today.  Hyperkalemia -Resolved  Leukocytosis -Improving.  Repeat a.m. labs.  Alcohol withdrawal Alcohol abuse -Continue CIWA protocol.  Continue lorazepam as needed.  Scheduled Valium discontinued.  Continue  thiamine, folic acid and multivitamin.  TOC consult  Paroxysmal A-fib -Currently rate controlled.  Continue metoprolol succinate and apixaban.  Hyperlipidemia -Continue statin  Morbid obesity -Outpatient follow-up  History of pulmonary embolism -Continue Eliquis  Physical deconditioning -Will need home health PT  DVT prophylaxis: Apixaban Code Status: Full Family Communication: None at bedside Disposition Plan: Status is: Inpatient Remains inpatient appropriate because: Of severity of illness    Consultants: Urology/nephrology/ID  Procedures: Cystoscopy and right ureteral stent placement on 09/05/2021 by urology  Antimicrobials:  Anti-infectives (From admission, onward)    Start     Dose/Rate Route Frequency Ordered Stop   09/07/21 2200  ceFAZolin (ANCEF) IVPB 2g/100 mL premix        2 g 200 mL/hr over 30 Minutes Intravenous Every 12 hours 09/07/21 1939     09/06/21 1800  ceFAZolin (ANCEF) IVPB 2g/100 mL premix  Status:  Discontinued        2 g 200 mL/hr over 30 Minutes Intravenous Every 12 hours 09/06/21 1113 09/07/21 1939   09/05/21 2000  cefTRIAXone (ROCEPHIN) 2 g in sodium chloride 0.9 % 100 mL IVPB  Status:  Discontinued        2 g 200 mL/hr over 30 Minutes Intravenous Every 24 hours 09/05/21 0614 09/06/21 1106   09/05/21 0715  metroNIDAZOLE (FLAGYL) IVPB 500 mg  Status:  Discontinued        500 mg 100 mL/hr over 60 Minutes Intravenous Every 12 hours 09/05/21 0616 09/05/21 0618   09/04/21 2000  cefTRIAXone (ROCEPHIN) 1 g in sodium chloride 0.9 % 100 mL IVPB  Status:  Discontinued        1 g 200 mL/hr over 30 Minutes Intravenous Every 24 hours 09/04/21 1920 09/05/21 1062       Subjective: Patient seen and examined at bedside.  Still feels weak but feels much better.  Concerned that her urine is still dark in color.  Denies fever, worsening chest pain or shortness of breath or vomiting.   Objective: Vitals:   09/07/21 1421 09/07/21 2046 09/07/21 2100  09/08/21 0630  BP:   126/73 135/90  Pulse:   99 87  Resp:   18 20  Temp:   98.1 F (36.7 C) 97.9 F (36.6 C)  TempSrc:   Oral Oral  SpO2: 98% 94% 99% 99%  Weight:      Height:        Intake/Output Summary (Last 24 hours) at 09/08/2021 0735 Last data filed at 09/08/2021 0600 Gross per 24 hour  Intake 1108.15 ml  Output 1150 ml  Net -41.85 ml    Filed Weights   09/02/21 0740  Weight: 135 kg    Examination:  General: No acute distress.  On room air currently.   ENT/neck: No palpable neck masses or elevated JVD  respiratory: Bilateral decreased air sounds at bases with scattered crackles  CVS: Rate mostly controlled; S1 and S2 are heard  abdominal: Soft, morbidly obese, nontender, still distended slightly; no organomegaly, bowel sounds are heard normally  extremities: No clubbing; mild lower extremity edema present CNS: Alert and awake.  No focal neurologic deficit.  Able to move extremities lymph: No palpable lymphadenopathy  skin: No obvious petechiae/rashes  psych: Affect and mood appear to be normal. Musculoskeletal: No obvious other joint swelling/deformity    Data Reviewed: I have personally reviewed following labs and imaging studies  CBC: Recent Labs  Lab 09/04/21 0704 09/04/21 2109 09/05/21 2139 09/06/21 0353 09/07/21 0401 09/08/21 0315  WBC 20.8* 28.2*  --  29.4* 18.5* 13.5*  NEUTROABS  --  26.2* 26.6*  --   --   --   HGB 11.2* 11.2*  --  11.4* 10.2* 9.9*  HCT 34.0* 34.5*  --  36.4 32.7* 31.9*  MCV 90.9 92.5  --  95.5 95.9 95.8  PLT 215 188  --  157 154 010    Basic Metabolic Panel: Recent Labs  Lab 09/05/21 1157 09/06/21 0353 09/06/21 0742 09/06/21 0752 09/06/21 1526 09/07/21 0401 09/08/21 0315  NA 133* 140 135  --   --  143 143  K 5.1 6.8* 6.6*  --  5.2* 4.5 4.1  CL 100 105 104  --   --  111 111  CO2 '22 22 22  '$ --   --  26 26  GLUCOSE 96 114* 94  --   --  124* 107*  BUN 55* 63* 60*  --   --  54* 38*  CREATININE 8.64* 6.50* 5.70*  --    --  3.01* 2.02*  CALCIUM 8.9 9.1 8.8*  --   --  8.9 8.7*  MG  --   --   --  2.0  --  1.9 1.7  PHOS 4.1  --  6.3*  --   --  3.7 2.8    GFR: Estimated Creatinine Clearance: 38.7 mL/min (A) (by C-G formula based on SCr of 2.02 mg/dL (H)). Liver Function Tests: Recent Labs  Lab 09/02/21 0753 09/04/21 2109 09/05/21 1157 09/06/21 0742 09/07/21 0401 09/08/21 0315  AST 67* 58*  --   --   --   --   ALT 58* 46*  --   --   --   --   ALKPHOS 129* 119  --   --   --   --   BILITOT 0.5 0.7  --   --   --   --  PROT 7.3 6.6  --   --   --   --   ALBUMIN 3.0* 2.7* 2.7* 2.9* 2.6* 2.7*    Recent Labs  Lab 09/02/21 0753  LIPASE 24    No results for input(s): "AMMONIA" in the last 168 hours. Coagulation Profile: No results for input(s): "INR", "PROTIME" in the last 168 hours. Cardiac Enzymes: Recent Labs  Lab 09/05/21 0950  CKTOTAL 426*    BNP (last 3 results) No results for input(s): "PROBNP" in the last 8760 hours. HbA1C: No results for input(s): "HGBA1C" in the last 72 hours. CBG: Recent Labs  Lab 09/05/21 1939  GLUCAP 134*    Lipid Profile: No results for input(s): "CHOL", "HDL", "LDLCALC", "TRIG", "CHOLHDL", "LDLDIRECT" in the last 72 hours. Thyroid Function Tests: No results for input(s): "TSH", "T4TOTAL", "FREET4", "T3FREE", "THYROIDAB" in the last 72 hours. Anemia Panel: No results for input(s): "VITAMINB12", "FOLATE", "FERRITIN", "TIBC", "IRON", "RETICCTPCT" in the last 72 hours. Sepsis Labs: Recent Labs  Lab 09/04/21 2109  PROCALCITON 57.93     Recent Results (from the past 240 hour(s))  Urine Culture     Status: Abnormal   Collection Time: 09/04/21  5:16 PM   Specimen: Urine, Clean Catch  Result Value Ref Range Status   Specimen Description URINE, CLEAN CATCH  Final   Special Requests   Final    NONE Performed at [facility], [place] [address] [place], [place] [address]    Culture 50,000 COLONIES/mL ESCHERICHIA COLI (A)  Final   Report Status  09/06/2021 FINAL  Final   Organism ID, Bacteria ESCHERICHIA COLI (A)  Final      Susceptibility   Escherichia coli - MIC*    AMPICILLIN >=32 RESISTANT Resistant     CEFAZOLIN <=4 SENSITIVE Sensitive     CEFEPIME <=0.12 SENSITIVE Sensitive     CEFTRIAXONE <=0.25 SENSITIVE Sensitive     CIPROFLOXACIN <=0.25 SENSITIVE Sensitive     GENTAMICIN <=1 SENSITIVE Sensitive     IMIPENEM <=0.25 SENSITIVE Sensitive     NITROFURANTOIN <=16 SENSITIVE Sensitive     TRIMETH/SULFA <=20 SENSITIVE Sensitive     AMPICILLIN/SULBACTAM 16 INTERMEDIATE Intermediate     PIP/TAZO <=4 SENSITIVE Sensitive     * 50,000 COLONIES/mL ESCHERICHIA COLI  Culture, blood (Routine X 2) w Reflex to ID Panel     Status: Abnormal   Collection Time: 09/04/21  9:09 PM   Specimen: BLOOD  Result Value Ref Range Status   Specimen Description BLOOD RIGHT ANTECUBITAL  Final   Special Requests   Final    BOTTLES DRAWN AEROBIC AND ANAEROBIC Blood Culture adequate volume   Culture  Setup Time   Final    GRAM NEGATIVE RODS CRITICAL RESULT CALLED TO, READ BACK BY AND VERIFIED WITH: PHARMD AUSTIN PAYTES [id number] PM BY EC    Culture (A)  Final    ESCHERICHIA COLI SUSCEPTIBILITIES PERFORMED ON PREVIOUS CULTURE WITHIN THE LAST 5 DAYS. Performed at [facility], [place] [address] [place], [place] [address]    Report Status 09/07/2021 FINAL  Final  Culture, blood (Routine X 2) w Reflex to ID Panel     Status: Abnormal   Collection Time: 09/04/21  9:09 PM   Specimen: BLOOD LEFT HAND  Result Value Ref Range Status   Specimen Description BLOOD LEFT HAND  Final   Special Requests   Final    BOTTLES DRAWN AEROBIC AND ANAEROBIC Blood Culture adequate volume   Culture  Setup Time   Final  GRAM NEGATIVE RODS ANAEROBIC BOTTLE ONLY Organism ID to follow CRITICAL RESULT CALLED TO, READ BACK BY AND VERIFIED WITH: PHARMD [place]USTIN PAYTES [id number] PM BY EC Performed at [facility], [address] [place], [place] [address]     Culture ESCHERICHIA COLI (A)  Final   Report Status 09/07/2021 FINAL  Final   Organism ID, Bacteria ESCHERICHIA COLI  Final      Susceptibility   Escherichia coli - MIC*    AMPICILLIN <=2 SENSITIVE Sensitive     CEFAZOLIN <=4 SENSITIVE Sensitive     CEFEPIME <=0.12 SENSITIVE Sensitive     CEFTAZIDIME <=1 SENSITIVE Sensitive     CEFTRIAXONE <=0.25 SENSITIVE Sensitive     CIPROFLOXACIN <=0.25 SENSITIVE Sensitive     GENTAMICIN <=1 SENSITIVE Sensitive     IMIPENEM <=0.25 SENSITIVE Sensitive     TRIMETH/SULFA <=20 SENSITIVE Sensitive     AMPICILLIN/SULBACTAM <=2 SENSITIVE Sensitive     PIP/TAZO <=4 SENSITIVE Sensitive     * ESCHERICHIA COLI  Blood Culture ID Panel (Reflexed)     Status: Abnormal   Collection Time: 09/04/21  9:09 PM  Result Value Ref Range Status   Enterococcus faecalis NOT DETECTED NOT DETECTED Final   Enterococcus Faecium NOT DETECTED NOT DETECTED Final   Listeria monocytogenes NOT DETECTED NOT DETECTED Final   Staphylococcus species NOT DETECTED NOT DETECTED Final   Staphylococcus aureus (BCID) NOT DETECTED NOT DETECTED Final   Staphylococcus epidermidis NOT DETECTED NOT DETECTED Final   Staphylococcus lugdunensis NOT DETECTED NOT DETECTED Final   Streptococcus species NOT DETECTED NOT DETECTED Final   Streptococcus agalactiae NOT DETECTED NOT DETECTED Final   Streptococcus pneumoniae NOT DETECTED NOT DETECTED Final   Streptococcus pyogenes NOT DETECTED NOT DETECTED Final   A.calcoaceticus-baumannii NOT DETECTED NOT DETECTED Final   Bacteroides fragilis NOT DETECTED NOT DETECTED Final   Enterobacterales DETECTED (A) NOT DETECTED Final    Comment: Enterobacterales represent a large order of gram negative bacteria, not a single organism.   Enterobacter cloacae complex NOT DETECTED NOT DETECTED Final   Escherichia coli DETECTED (A) NOT DETECTED Final    Comment: CRITICAL RESULT CALLED TO, READ BACK BY AND VERIFIED WITH: PHARMD AUSTIN PAYTES [id number] 0626 PM BY EC     Klebsiella aerogenes NOT DETECTED NOT DETECTED Final   Klebsiella oxytoca NOT DETECTED NOT DETECTED Final   Klebsiella pneumoniae NOT DETECTED NOT DETECTED Final   Proteus species NOT DETECTED NOT DETECTED Final   Salmonella species NOT DETECTED NOT DETECTED Final   Serratia marcescens NOT DETECTED NOT DETECTED Final   Haemophilus influenzae NOT DETECTED NOT DETECTED Final   Neisseria meningitidis NOT DETECTED NOT DETECTED Final   Pseudomonas aeruginosa NOT DETECTED NOT DETECTED Final   Stenotrophomonas maltophilia NOT DETECTED NOT DETECTED Final   Candida albicans NOT DETECTED NOT DETECTED Final   Candida auris NOT DETECTED NOT DETECTED Final   Candida glabrata NOT DETECTED NOT DETECTED Final   Candida krusei NOT DETECTED NOT DETECTED Final   Candida parapsilosis NOT DETECTED NOT DETECTED Final   Candida tropicalis NOT DETECTED NOT DETECTED Final   Cryptococcus neoformans/gattii NOT DETECTED NOT DETECTED Final   CTX-M ESBL NOT DETECTED NOT DETECTED Final   Carbapenem resistance IMP NOT DETECTED NOT DETECTED Final   Carbapenem resistance KPC NOT DETECTED NOT DETECTED Final   Carbapenem resistance NDM NOT DETECTED NOT DETECTED Final   Carbapenem resist OXA 48 LIKE NOT DETECTED NOT DETECTED Final   Carbapenem resistance VIM NOT DETECTED NOT DETECTED Final  Comment: Performed at [facility], [place] [address] [place], [place] [address]  Urine Culture     Status: Abnormal   Collection Time: 09/05/21  7:16 PM   Specimen: Urine, Cystoscope  Result Value Ref Range Status   Specimen Description   Final    URINE, RANDOM Performed at [place] [address] [place], [place] [address]    Special Requests   Final    NONE RIGHT RENAL PELVIS URINE CULTURE Performed at [place] [address] [place], [place] [address]    Culture 50,000 COLONIES/mL ESCHERICHIA COLI (A)  Final   Report Status 09/07/2021 FINAL  Final   Organism ID,  Bacteria ESCHERICHIA COLI (A)  Final      Susceptibility   Escherichia coli - MIC*    AMPICILLIN <=2 SENSITIVE Sensitive     CEFAZOLIN <=4 SENSITIVE Sensitive     CEFEPIME <=0.12 SENSITIVE Sensitive     CEFTRIAXONE <=0.25 SENSITIVE Sensitive     CIPROFLOXACIN <=0.25 SENSITIVE Sensitive     GENTAMICIN <=1 SENSITIVE Sensitive     IMIPENEM <=0.25 SENSITIVE Sensitive     NITROFURANTOIN <=16 SENSITIVE Sensitive     TRIMETH/SULFA <=20 SENSITIVE Sensitive     AMPICILLIN/SULBACTAM <=2 SENSITIVE Sensitive     PIP/TAZO <=4 SENSITIVE Sensitive     * 50,000 COLONIES/mL ESCHERICHIA COLI  Culture, blood (Routine X 2) w Reflex to ID Panel     Status: None (Preliminary result)   Collection Time: 09/06/21 12:20 PM   Specimen: BLOOD  Result Value Ref Range Status   Specimen Description   Final    BLOOD RIGHT ANTECUBITAL Performed at [facility] [address] [place], [place] [address]    Special Requests   Final    BOTTLES DRAWN AEROBIC ONLY Blood Culture adequate volume Performed at [facility] [address], [place], [place] [address]    Culture   Final    NO GROWTH < 24 HOURS Performed at [facility] [address], [place], [place] [address]    Report Status PENDING  Incomplete  Culture, blood (Routine X 2) w Reflex to ID Panel     Status: None (Preliminary result)   Collection Time: 09/06/21 12:22 PM   Specimen: BLOOD  Result Value Ref Range Status   Specimen Description   Final    BLOOD BLOOD RIGHT WRIST Performed at [place] [address], [place], [place] [address]    Special Requests   Final    BOTTLES DRAWN AEROBIC ONLY Blood Culture adequate volume Performed at [place] [address] [place], [place] [address]    Culture   Final    NO GROWTH < 24 HOURS Performed at [facility] [address], [place], [place] [address]    Report Status PENDING  Incomplete          Radiology Studies: No results found.      Scheduled Meds:  albuterol  2 puff Inhalation TID   apixaban  5 mg Oral BID   atorvastatin  20 mg Oral Daily   docusate sodium  100 mg Oral BID   folic acid  1 mg Oral Daily   LORazepam  0-4 mg Intravenous Q8H   metoprolol tartrate  12.5 mg Oral BID   multivitamin with minerals  1 tablet Oral Daily   pantoprazole  40 mg Oral Daily   sodium chloride flush  3  mL Intravenous Q12H   thiamine  100 mg Oral Daily   Continuous Infusions:  sodium chloride 65 mL/hr at 09/07/21 1147    ceFAZolin (ANCEF) IV 2 g (09/07/21 2203)          Aline August, MD [facility] 09/08/2021, 7:35 AM

## 2021-09-08 NOTE — Progress Notes (Signed)
Pharmacy Antibiotic Note  [name] is a 61 y.o. female admitted on 09/02/2021 with E.coli bacteremia secondary to urinary source with extensive stones. She is s/p stent placement. Pharmacy has been consulted for cefazolin dosing.  Plan: -Increase cefazolin to 2 g IV q8h for continued improvement in renal function -ID sign off - recommend 14 days of treatment total, can step down to PO cefadroxil as she improves -Pharmacy to sign off consult with improved renal function but will continue to follow plan of care for transition to oral antibiotics  Height: '5\' 1"'$  (154.9 cm) Weight: 135 kg (297 lb 9.9 oz) IBW/kg (Calculated) : 47.8  Temp (24hrs), Avg:98 F (36.7 C), Min:97.9 F (36.6 C), Max:98.1 F (36.7 C)  Recent Labs  Lab 09/04/21 0704 09/04/21 2109 09/05/21 1157 09/06/21 0353 09/06/21 0742 09/07/21 0401 09/08/21 0315  WBC 20.8* 28.2*  --  29.4*  --  18.5* 13.5*  CREATININE 7.14* 8.22* 8.64* 6.50* 5.70* 3.01* 2.02*     Estimated Creatinine Clearance: 38.7 mL/min (A) (by C-G formula based on SCr of 2.02 mg/dL (H)).    Allergies  Allergen Reactions   Morphine Itching   Shrimp [Shellfish Allergy] Itching and Other (See Comments)    Tongue burns also   Tramadol Other (See Comments)    Caused confusion   Covid-19 (Mrna) Vaccine Hives   Diltiazem Hcl Itching    Pt with itching of the feet when bolus given   Other Other (See Comments)   Latex Rash    Antimicrobials this admission: Cefazolin 7/28 >> Ceftriaxone 7/26 >> 7/28   Microbiology results: 7/28 BCx: 2/2 sets E.coli (pan sensitive) 7/27 UCx (cystoscope): 50k E.coli (pan sensitive) 7/26 UCx: 50k E.coli (R-amp, Unasyn)   Thank you for allowing pharmacy to be a part of this patient's care.  Tawnya Crook, PharmD, BCPS Clinical Pharmacist 09/08/2021 12:58 PM

## 2021-09-09 DIAGNOSIS — F10231 Alcohol dependence with withdrawal delirium: Secondary | ICD-10-CM | POA: Diagnosis not present

## 2021-09-09 DIAGNOSIS — N179 Acute kidney failure, unspecified: Secondary | ICD-10-CM | POA: Diagnosis not present

## 2021-09-09 DIAGNOSIS — N10 Acute tubulo-interstitial nephritis: Secondary | ICD-10-CM | POA: Diagnosis not present

## 2021-09-09 DIAGNOSIS — R7881 Bacteremia: Secondary | ICD-10-CM | POA: Diagnosis not present

## 2021-09-09 LAB — BASIC METABOLIC PANEL
Anion gap: 7 (ref 5–15)
BUN: 25 mg/dL — ABNORMAL HIGH (ref 6–20)
CO2: 25 mmol/L (ref 22–32)
Calcium: 8.4 mg/dL — ABNORMAL LOW (ref 8.9–10.3)
Chloride: 110 mmol/L (ref 98–111)
Creatinine, Ser: 1.37 mg/dL — ABNORMAL HIGH (ref 0.44–1.00)
GFR, Estimated: 44 mL/min — ABNORMAL LOW (ref >60)
Glucose, Bld: 101 mg/dL — ABNORMAL HIGH (ref 70–99)
Potassium: 3.8 mmol/L (ref 3.5–5.1)
Sodium: 142 mmol/L (ref 135–145)

## 2021-09-09 LAB — RENAL FUNCTION PANEL
Albumin: 2.6 g/dL — ABNORMAL LOW (ref 3.5–5.0)
Anion gap: 9 (ref 5–15)
BUN: 25 mg/dL — ABNORMAL HIGH (ref 6–20)
CO2: 23 mmol/L (ref 22–32)
Calcium: 8.4 mg/dL — ABNORMAL LOW (ref 8.9–10.3)
Chloride: 108 mmol/L (ref 98–111)
Creatinine, Ser: 1.4 mg/dL — ABNORMAL HIGH (ref 0.44–1.00)
GFR, Estimated: 43 mL/min — ABNORMAL LOW (ref >60)
Glucose, Bld: 99 mg/dL (ref 70–99)
Phosphorus: 2.6 mg/dL (ref 2.5–4.6)
Potassium: 3.8 mmol/L (ref 3.5–5.1)
Sodium: 140 mmol/L (ref 135–145)

## 2021-09-09 LAB — CBC WITH DIFFERENTIAL/PLATELET
Abs Immature Granulocytes: 0.25 10*3/uL — ABNORMAL HIGH (ref 0.00–0.07)
Basophils Absolute: 0.1 10*3/uL (ref 0.0–0.1)
Basophils Relative: 1 %
Eosinophils Absolute: 0.3 10*3/uL (ref 0.0–0.5)
Eosinophils Relative: 2 %
HCT: 32.2 % — ABNORMAL LOW (ref 36.0–46.0)
Hemoglobin: 10.1 g/dL — ABNORMAL LOW (ref 12.0–15.0)
Immature Granulocytes: 2 %
Lymphocytes Relative: 16 %
Lymphs Abs: 2.1 10*3/uL (ref 0.7–4.0)
MCH: 29.9 pg (ref 26.0–34.0)
MCHC: 31.4 g/dL (ref 30.0–36.0)
MCV: 95.3 fL (ref 80.0–100.0)
Monocytes Absolute: 1.4 10*3/uL — ABNORMAL HIGH (ref 0.1–1.0)
Monocytes Relative: 11 %
Neutro Abs: 9.1 10*3/uL — ABNORMAL HIGH (ref 1.7–7.7)
Neutrophils Relative %: 68 %
Platelets: 228 10*3/uL (ref 150–400)
RBC: 3.38 MIL/uL — ABNORMAL LOW (ref 3.87–5.11)
RDW: 15 % (ref 11.5–15.5)
WBC: 13.2 10*3/uL — ABNORMAL HIGH (ref 4.0–10.5)
nRBC: 0.2 % (ref 0.0–0.2)

## 2021-09-09 LAB — ANCA TITERS
Atypical P-ANCA titer: <1:20 {titer}
C-ANCA: <1:20 {titer}
P-ANCA: <1:20 {titer}

## 2021-09-09 LAB — MAGNESIUM: Magnesium: 1.6 mg/dL — ABNORMAL LOW (ref 1.7–2.4)

## 2021-09-09 MED ORDER — FUROSEMIDE 10 MG/ML IJ SOLN
60.0000 mg | Freq: Once | INTRAMUSCULAR | Status: AC
  Administered 2021-09-09: 60 mg via INTRAVENOUS
  Filled 2021-09-09: qty 6

## 2021-09-09 MED ORDER — MENTHOL 3 MG MT LOZG
1.0000 | LOZENGE | OROMUCOSAL | Status: DC | PRN
Start: 2021-09-09 — End: 2021-09-10
  Filled 2021-09-09: qty 9

## 2021-09-09 MED ORDER — MAGNESIUM SULFATE 2 GM/50ML IV SOLN
2.0000 g | Freq: Once | INTRAVENOUS | Status: AC
  Administered 2021-09-09: 2 g via INTRAVENOUS
  Filled 2021-09-09: qty 50

## 2021-09-09 NOTE — Progress Notes (Signed)
PROGRESS NOTE    [name]  WGY:[id number] DOB: 1960/03/21 DOA: 09/02/2021 PCP: Biagio Borg, MD   Brief Narrative:  61 year old female with history of colovaginal fistula in the past, prior bladder and colonic abscess with pelvic drainage in 2018, paroxysmal A-fib on Eliquis, mild intermittent asthma, hypertension, hyperlipidemia, alcohol abuse presented with tremors and hallucinations and was initially admitted for alcohol withdrawal.  She was also subsequently found to have severe sepsis with acute kidney injury with obstructive uropathy/pyelonephritis/right ureterolithiasis with hydronephrosis and E. coli bacteremia and started on IV fluids and antibiotics.  Creatinine worsened to 8.6 from a baseline of 1.  Urology and nephrology were consulted.  She was transferred to [facility] long hospital from [facility] per urology recommendations.  She underwent cystoscopy and stenting on 09/05/2021.  ID was also consulted  Assessment & Plan:   Severe sepsis Acute kidney injury UTI/pyelonephritis/obstructive uropathy/right ureterolithiasis with hydronephrosis E. coli bacteremia -Baseline creatinine of 1.  Presented with creatinine of 3.58 and worsened to 8.6.  Urology and nephrology were consulted.  Treated with IV fluids. -Status post cystoscopy and right ureteral stent on 09/05/2021 by urology.  Urology recommend appropriate antibiotics and will follow up as an outpatient for definitive management management of stone -Currently on Ancef.  ID recommended total of 2 weeks course of antibiotic therapy including Augmentin on discharge and signed off on 09/07/2021. Nephrology signed off on 09/07/2021. -Creatinine improving to 1.40 today.  DC IV fluids today.  Patient has lower extremity edema and is concerned about it: We will give 1 dose of IV Lasix.  Hyperkalemia -Resolved  Leukocytosis -Improving.  Repeat a.m. labs.  Alcohol withdrawal Alcohol abuse -Continue CIWA protocol.  Continue  lorazepam as needed.  Scheduled Valium discontinued.  Continue thiamine, folic acid and multivitamin.  TOC consult  Paroxysmal A-fib -Currently rate controlled.  Continue metoprolol succinate and apixaban.  Hyperlipidemia -Continue statin  Morbid obesity -Outpatient follow-up  History of pulmonary embolism -Continue Eliquis  Physical deconditioning -Will need home health PT  DVT prophylaxis: Apixaban Code Status: Full Family Communication: None at bedside Disposition Plan: Status is: Inpatient Remains inpatient appropriate because: Of severity of illness.  Possible discharge in 24 to 48 hours if continues to improve clinically.    Consultants: Urology/nephrology/ID  Procedures: Cystoscopy and right ureteral stent placement on 09/05/2021 by urology  Antimicrobials:  Anti-infectives (From admission, onward)    Start     Dose/Rate Route Frequency Ordered Stop   09/08/21 1400  ceFAZolin (ANCEF) IVPB 2g/100 mL premix        2 g 200 mL/hr over 30 Minutes Intravenous Every 8 hours 09/08/21 1257 09/18/21 2359   09/07/21 2200  ceFAZolin (ANCEF) IVPB 2g/100 mL premix  Status:  Discontinued        2 g 200 mL/hr over 30 Minutes Intravenous Every 12 hours 09/07/21 1939 09/08/21 1257   09/06/21 1800  ceFAZolin (ANCEF) IVPB 2g/100 mL premix  Status:  Discontinued        2 g 200 mL/hr over 30 Minutes Intravenous Every 12 hours 09/06/21 1113 09/07/21 1939   09/05/21 2000  cefTRIAXone (ROCEPHIN) 2 g in sodium chloride 0.9 % 100 mL IVPB  Status:  Discontinued        2 g 200 mL/hr over 30 Minutes Intravenous Every 24 hours 09/05/21 0614 09/06/21 1106   09/05/21 0715  metroNIDAZOLE (FLAGYL) IVPB 500 mg  Status:  Discontinued        500 mg 100 mL/hr over 60 Minutes Intravenous  Every 12 hours 09/05/21 0616 09/05/21 0618   09/04/21 2000  cefTRIAXone (ROCEPHIN) 1 g in sodium chloride 0.9 % 100 mL IVPB  Status:  Discontinued        1 g 200 mL/hr over 30 Minutes Intravenous Every 24 hours  09/04/21 1920 09/05/21 2841       Subjective: Patient seen and examined at bedside.  Feels better but still feels weak.  Does not feel ready to go home yet.  No overnight fever, vomiting or chest pain reported.  Complains of some leg swelling.   Objective: Vitals:   09/08/21 1316 09/08/21 1938 09/09/21 0500 09/09/21 0513  BP: 113/70 126/76  139/86  Pulse: 88 85  83  Resp: '18 18  18  '$ Temp: (!) 97.5 F (36.4 C) 98.4 F (36.9 C)  97.8 F (36.6 C)  TempSrc: Oral Oral  Oral  SpO2: 99% 99%  99%  Weight:   (!) 140.2 kg   Height:        Intake/Output Summary (Last 24 hours) at 09/09/2021 1057 Last data filed at 09/09/2021 0520 Gross per 24 hour  Intake 1455.1 ml  Output 6 ml  Net 1449.1 ml    Filed Weights   09/02/21 0740 09/09/21 0500  Weight: 135 kg (!) 140.2 kg    Examination:  General: Still on room air.  No distress currently.   ENT/neck: JVD is not elevated.  No palpable thyromegaly.   Respiratory: Decreased breath sounds at bases bilaterally with basilar crackles CVS: S1-S2 heard; rate controlled currently abdominal: Soft, morbidly obese, nontender, mildly distended; no organomegaly, bowel sounds heard extremities: Bilateral lower extremity edema present; no cyanosis  CNS: Alert and oriented.  No focal neurologic deficit.  Moving extremities  lymph: No cervical lymphadenopathy noted  skin: No obvious ecchymosis/other lesions  psych: Currently not agitated.  Mostly flat affect.   Musculoskeletal: No obvious other joint swelling/deformity    Data Reviewed: I have personally reviewed following labs and imaging studies  CBC: Recent Labs  Lab 09/04/21 2109 09/05/21 2139 09/06/21 0353 09/07/21 0401 09/08/21 0315 09/09/21 0331  WBC 28.2*  --  29.4* 18.5* 13.5* 13.2*  NEUTROABS 26.2* 26.6*  --   --   --  9.1*  HGB 11.2*  --  11.4* 10.2* 9.9* 10.1*  HCT 34.5*  --  36.4 32.7* 31.9* 32.2*  MCV 92.5  --  95.5 95.9 95.8 95.3  PLT 188  --  157 154 185 228    Basic  Metabolic Panel: Recent Labs  Lab 09/05/21 1157 09/06/21 0353 09/06/21 0742 09/06/21 0752 09/06/21 1526 09/07/21 0401 09/08/21 0315 09/09/21 0331  NA 133* 140 135  --   --  143 143 140  142  K 5.1 6.8* 6.6*  --  5.2* 4.5 4.1 3.8  3.8  CL 100 105 104  --   --  111 111 108  110  CO2 '22 22 22  '$ --   --  '26 26 23  25  '$ GLUCOSE 96 114* 94  --   --  124* 107* 99  101*  BUN 55* 63* 60*  --   --  54* 38* 25*  25*  CREATININE 8.64* 6.50* 5.70*  --   --  3.01* 2.02* 1.40*  1.37*  CALCIUM 8.9 9.1 8.8*  --   --  8.9 8.7* 8.4*  8.4*  MG  --   --   --  2.0  --  1.9 1.7 1.6*  PHOS 4.1  --  6.3*  --   --  3.7 2.8 2.6    GFR: Estimated Creatinine Clearance: 57.2 mL/min (A) (by C-G formula based on SCr of 1.4 mg/dL (H)). Liver Function Tests: Recent Labs  Lab 09/04/21 2109 09/05/21 1157 09/06/21 0742 09/07/21 0401 09/08/21 0315 09/09/21 0331  AST 58*  --   --   --   --   --   ALT 46*  --   --   --   --   --   ALKPHOS 119  --   --   --   --   --   BILITOT 0.7  --   --   --   --   --   PROT 6.6  --   --   --   --   --   ALBUMIN 2.7* 2.7* 2.9* 2.6* 2.7* 2.6*    No results for input(s): "LIPASE", "AMYLASE" in the last 168 hours.  No results for input(s): "AMMONIA" in the last 168 hours. Coagulation Profile: No results for input(s): "INR", "PROTIME" in the last 168 hours. Cardiac Enzymes: Recent Labs  Lab 09/05/21 0950  CKTOTAL 426*    BNP (last 3 results) No results for input(s): "PROBNP" in the last 8760 hours. HbA1C: No results for input(s): "HGBA1C" in the last 72 hours. CBG: Recent Labs  Lab 09/05/21 1939  GLUCAP 134*    Lipid Profile: No results for input(s): "CHOL", "HDL", "LDLCALC", "TRIG", "CHOLHDL", "LDLDIRECT" in the last 72 hours. Thyroid Function Tests: No results for input(s): "TSH", "T4TOTAL", "FREET4", "T3FREE", "THYROIDAB" in the last 72 hours. Anemia Panel: No results for input(s): "VITAMINB12", "FOLATE", "FERRITIN", "TIBC", "IRON", "RETICCTPCT"  in the last 72 hours. Sepsis Labs: Recent Labs  Lab 09/04/21 2109  PROCALCITON 57.93     Recent Results (from the past 240 hour(s))  Urine Culture     Status: Abnormal   Collection Time: 09/04/21  5:16 PM   Specimen: Urine, Clean Catch  Result Value Ref Range Status   Specimen Description URINE, CLEAN CATCH  Final   Special Requests   Final    NONE Performed at [facility], [place] [address], [place], [place] [address]    Culture 50,000 COLONIES/mL ESCHERICHIA COLI (A)  Final   Report Status 09/06/2021 FINAL  Final   Organism ID, Bacteria ESCHERICHIA COLI (A)  Final      Susceptibility   Escherichia coli - MIC*    AMPICILLIN >=32 RESISTANT Resistant     CEFAZOLIN <=4 SENSITIVE Sensitive     CEFEPIME <=0.12 SENSITIVE Sensitive     CEFTRIAXONE <=0.25 SENSITIVE Sensitive     CIPROFLOXACIN <=0.25 SENSITIVE Sensitive     GENTAMICIN <=1 SENSITIVE Sensitive     IMIPENEM <=0.25 SENSITIVE Sensitive     NITROFURANTOIN <=16 SENSITIVE Sensitive     TRIMETH/SULFA <=20 SENSITIVE Sensitive     AMPICILLIN/SULBACTAM 16 INTERMEDIATE Intermediate     PIP/TAZO <=4 SENSITIVE Sensitive     * 50,000 COLONIES/mL ESCHERICHIA COLI  Culture, blood (Routine X 2) w Reflex to ID Panel     Status: Abnormal   Collection Time: 09/04/21  9:09 PM   Specimen: BLOOD  Result Value Ref Range Status   Specimen Description BLOOD RIGHT ANTECUBITAL  Final   Special Requests   Final    BOTTLES DRAWN AEROBIC AND ANAEROBIC Blood Culture adequate volume   Culture  Setup Time   Final    GRAM NEGATIVE RODS CRITICAL RESULT CALLED TO, READ BACK BY AND VERIFIED WITH: PHARMD AUSTIN PAYTES [id number] 1205 PM BY EC  Culture (A)  Final    ESCHERICHIA COLI SUSCEPTIBILITIES PERFORMED ON PREVIOUS CULTURE WITHIN THE LAST 5 DAYS. Performed at [facility], [place] [address], [place], [place] [address]    Report Status 09/07/2021 FINAL  Final  Culture, blood (Routine X 2) w Reflex to ID Panel     Status: Abnormal    Collection Time: 09/04/21  9:09 PM   Specimen: BLOOD LEFT HAND  Result Value Ref Range Status   Specimen Description BLOOD LEFT HAND  Final   Special Requests   Final    BOTTLES DRAWN AEROBIC AND ANAEROBIC Blood Culture adequate volume   Culture  Setup Time   Final    GRAM NEGATIVE RODS ANAEROBIC BOTTLE ONLY Organism ID to follow CRITICAL RESULT CALLED TO, READ BACK BY AND VERIFIED WITH: PHARMD AUSTIN PAYTES [id number] PM BY EC Performed at [facility], [place] [address] [place], [place] [address]    Culture ESCHERICHIA COLI (A)  Final   Report Status 09/07/2021 FINAL  Final   Organism ID, Bacteria ESCHERICHIA COLI  Final      Susceptibility   Escherichia coli - MIC*    AMPICILLIN <=2 SENSITIVE Sensitive     CEFAZOLIN <=4 SENSITIVE Sensitive     CEFEPIME <=0.12 SENSITIVE Sensitive     CEFTAZIDIME <=1 SENSITIVE Sensitive     CEFTRIAXONE <=0.25 SENSITIVE Sensitive     CIPROFLOXACIN <=0.25 SENSITIVE Sensitive     GENTAMICIN <=1 SENSITIVE Sensitive     IMIPENEM <=0.25 SENSITIVE Sensitive     TRIMETH/SULFA <=20 SENSITIVE Sensitive     AMPICILLIN/SULBACTAM <=2 SENSITIVE Sensitive     PIP/TAZO <=4 SENSITIVE Sensitive     * ESCHERICHIA COLI  Blood Culture ID Panel (Reflexed)     Status: Abnormal   Collection Time: 09/04/21  9:09 PM  Result Value Ref Range Status   Enterococcus faecalis NOT DETECTED NOT DETECTED Final   Enterococcus Faecium NOT DETECTED NOT DETECTED Final   Listeria monocytogenes NOT DETECTED NOT DETECTED Final   Staphylococcus species NOT DETECTED NOT DETECTED Final   Staphylococcus aureus (BCID) NOT DETECTED NOT DETECTED Final   Staphylococcus epidermidis NOT DETECTED NOT DETECTED Final   Staphylococcus lugdunensis NOT DETECTED NOT DETECTED Final   Streptococcus species NOT DETECTED NOT DETECTED Final   Streptococcus agalactiae NOT DETECTED NOT DETECTED Final   Streptococcus pneumoniae NOT DETECTED NOT DETECTED Final   Streptococcus pyogenes NOT DETECTED NOT  DETECTED Final   A.calcoaceticus-baumannii NOT DETECTED NOT DETECTED Final   Bacteroides fragilis NOT DETECTED NOT DETECTED Final   Enterobacterales DETECTED (A) NOT DETECTED Final    Comment: Enterobacterales represent a large order of gram negative bacteria, not a single organism.   Enterobacter cloacae complex NOT DETECTED NOT DETECTED Final   Escherichia coli DETECTED (A) NOT DETECTED Final    Comment: CRITICAL RESULT CALLED TO, READ BACK BY AND VERIFIED WITH: PHARMD AUSTIN PAYTES [id number] 2376 PM BY EC    Klebsiella aerogenes NOT DETECTED NOT DETECTED Final   Klebsiella oxytoca NOT DETECTED NOT DETECTED Final   Klebsiella pneumoniae NOT DETECTED NOT DETECTED Final   Proteus species NOT DETECTED NOT DETECTED Final   Salmonella species NOT DETECTED NOT DETECTED Final   Serratia marcescens NOT DETECTED NOT DETECTED Final   Haemophilus influenzae NOT DETECTED NOT DETECTED Final   Neisseria meningitidis NOT DETECTED NOT DETECTED Final   Pseudomonas aeruginosa NOT DETECTED NOT DETECTED Final   Stenotrophomonas maltophilia NOT DETECTED NOT DETECTED Final   Candida albicans NOT DETECTED NOT  DETECTED Final   Candida auris NOT DETECTED NOT DETECTED Final   Candida glabrata NOT DETECTED NOT DETECTED Final   Candida krusei NOT DETECTED NOT DETECTED Final   Candida parapsilosis NOT DETECTED NOT DETECTED Final   Candida tropicalis NOT DETECTED NOT DETECTED Final   Cryptococcus neoformans/gattii NOT DETECTED NOT DETECTED Final   CTX-M ESBL NOT DETECTED NOT DETECTED Final   Carbapenem resistance IMP NOT DETECTED NOT DETECTED Final   Carbapenem resistance KPC NOT DETECTED NOT DETECTED Final   Carbapenem resistance NDM NOT DETECTED NOT DETECTED Final   Carbapenem resist OXA 48 LIKE NOT DETECTED NOT DETECTED Final   Carbapenem resistance VIM NOT DETECTED NOT DETECTED Final    Comment: Performed at [facility] [address], [place], [place] [address]  Urine Culture     Status: Abnormal    Collection Time: 09/05/21  7:16 PM   Specimen: Urine, Cystoscope  Result Value Ref Range Status   Specimen Description   Final    URINE, RANDOM Performed at [facility] [address], [place], [place] [address]    Special Requests   Final    NONE RIGHT RENAL PELVIS URINE CULTURE Performed at [facility] [address], [place], [place] [address]    Culture 50,000 COLONIES/mL ESCHERICHIA COLI (A)  Final   Report Status 09/07/2021 FINAL  Final   Organism ID, Bacteria ESCHERICHIA COLI (A)  Final      Susceptibility   Escherichia coli - MIC*    AMPICILLIN <=2 SENSITIVE Sensitive     CEFAZOLIN <=4 SENSITIVE Sensitive     CEFEPIME <=0.12 SENSITIVE Sensitive     CEFTRIAXONE <=0.25 SENSITIVE Sensitive     CIPROFLOXACIN <=0.25 SENSITIVE Sensitive     GENTAMICIN <=1 SENSITIVE Sensitive     IMIPENEM <=0.25 SENSITIVE Sensitive     NITROFURANTOIN <=16 SENSITIVE Sensitive     TRIMETH/SULFA <=20 SENSITIVE Sensitive     AMPICILLIN/SULBACTAM <=2 SENSITIVE Sensitive     PIP/TAZO <=4 SENSITIVE Sensitive     * 50,000 COLONIES/mL ESCHERICHIA COLI  Culture, blood (Routine X 2) w Reflex to ID Panel     Status: None (Preliminary result)   Collection Time: 09/06/21 12:20 PM   Specimen: BLOOD  Result Value Ref Range Status   Specimen Description   Final    BLOOD RIGHT ANTECUBITAL Performed at [facility] [address], [place], [place] [address]    Special Requests   Final    BOTTLES DRAWN AEROBIC ONLY Blood Culture adequate volume Performed at [facility] [address] [place], [place] [address]    Culture   Final    NO GROWTH 3 DAYS Performed at [facility], [place] [address] [place], [place] [address]    Report Status PENDING  Incomplete  Culture, blood (Routine X 2) w Reflex to ID Panel     Status: None (Preliminary result)   Collection Time: 09/06/21 12:22 PM   Specimen: BLOOD  Result Value Ref  Range Status   Specimen Description   Final    BLOOD BLOOD RIGHT WRIST Performed at [facility] [address], [place], [place] [address]    Special Requests   Final    BOTTLES DRAWN AEROBIC ONLY Blood Culture adequate volume Performed at [facility] [address], [place], [place] [address]    Culture   Final    NO GROWTH 3 DAYS Performed at [facility],  [address] [place], [place] [address]    Report Status PENDING  Incomplete         Radiology Studies: No results found.      Scheduled Meds:  apixaban  5 mg Oral BID   atorvastatin  20 mg Oral Daily   docusate sodium  100 mg Oral BID   folic acid  1 mg Oral Daily   metoprolol tartrate  12.5 mg Oral BID   multivitamin with minerals  1 tablet Oral Daily   oxybutynin  5 mg Oral TID   pantoprazole  40 mg Oral Daily   sodium chloride flush  3 mL Intravenous Q12H   thiamine  100 mg Oral Daily   Continuous Infusions:  sodium chloride 50 mL/hr at 09/09/21 0447    ceFAZolin (ANCEF) IV 2 g (09/09/21 0518)   magnesium sulfate bolus IVPB            Aline August, MD [facility] 09/09/2021, 10:57 AM

## 2021-09-09 NOTE — TOC Progression Note (Addendum)
Transition of Care [facility]) - Progression Note    Patient Details  Name: [name] MRN: [id number] Date of Birth: 12-Aug-1960  Transition [facility]) CM/SW Contact  Leeroy Cha, RN Phone Number: 09/09/2021, 8:01 AM  Clinical Narrative:    [id number]/pt has pvt insurance is not elig.  For match or medication assistance will alert her to good rx and generic brands. Shower stool ordered through [employer] will be sent to the house.  Expected Discharge Plan: Home/Self Care Barriers to Discharge: Continued Medical Work up  Expected Discharge Plan and Services Expected Discharge Plan: Home/Self Care   Discharge Planning Services: CM Consult   Living arrangements for the past 2 months: Single Family Home                                       Social Determinants of Health (SDOH) Interventions    Readmission Risk Interventions     No data to display

## 2021-09-09 NOTE — Plan of Care (Signed)
  Problem: Nutrition: Goal: Adequate nutrition will be maintained Outcome: Adequate for Discharge   Problem: Activity: Goal: Risk for activity intolerance will decrease Outcome: Adequate for Discharge   Problem: Elimination: Goal: Will not experience complications related to bowel motility Outcome: Adequate for Discharge

## 2021-09-10 ENCOUNTER — Other Ambulatory Visit (HOSPITAL_COMMUNITY): Payer: Self-pay

## 2021-09-10 DIAGNOSIS — N10 Acute tubulo-interstitial nephritis: Secondary | ICD-10-CM | POA: Diagnosis not present

## 2021-09-10 DIAGNOSIS — N1330 Unspecified hydronephrosis: Secondary | ICD-10-CM | POA: Diagnosis not present

## 2021-09-10 DIAGNOSIS — N179 Acute kidney failure, unspecified: Secondary | ICD-10-CM | POA: Diagnosis not present

## 2021-09-10 DIAGNOSIS — R69 Illness, unspecified: Secondary | ICD-10-CM | POA: Diagnosis not present

## 2021-09-10 DIAGNOSIS — I480 Paroxysmal atrial fibrillation: Secondary | ICD-10-CM | POA: Diagnosis not present

## 2021-09-10 DIAGNOSIS — D72829 Elevated white blood cell count, unspecified: Secondary | ICD-10-CM | POA: Diagnosis not present

## 2021-09-10 DIAGNOSIS — B9620 Unspecified Escherichia coli [E. coli] as the cause of diseases classified elsewhere: Secondary | ICD-10-CM | POA: Diagnosis not present

## 2021-09-10 DIAGNOSIS — Z86711 Personal history of pulmonary embolism: Secondary | ICD-10-CM | POA: Diagnosis not present

## 2021-09-10 DIAGNOSIS — N201 Calculus of ureter: Secondary | ICD-10-CM | POA: Diagnosis not present

## 2021-09-10 DIAGNOSIS — R7881 Bacteremia: Secondary | ICD-10-CM | POA: Diagnosis not present

## 2021-09-10 DIAGNOSIS — F10231 Alcohol dependence with withdrawal delirium: Secondary | ICD-10-CM | POA: Diagnosis not present

## 2021-09-10 LAB — RENAL FUNCTION PANEL
Albumin: 2.6 g/dL — ABNORMAL LOW (ref 3.5–5.0)
Anion gap: 7 (ref 5–15)
BUN: 25 mg/dL — ABNORMAL HIGH (ref 6–20)
CO2: 25 mmol/L (ref 22–32)
Calcium: 8.5 mg/dL — ABNORMAL LOW (ref 8.9–10.3)
Chloride: 108 mmol/L (ref 98–111)
Creatinine, Ser: 1.3 mg/dL — ABNORMAL HIGH (ref 0.44–1.00)
GFR, Estimated: 47 mL/min — ABNORMAL LOW (ref >60)
Glucose, Bld: 120 mg/dL — ABNORMAL HIGH (ref 70–99)
Phosphorus: 2.9 mg/dL (ref 2.5–4.6)
Potassium: 3.8 mmol/L (ref 3.5–5.1)
Sodium: 140 mmol/L (ref 135–145)

## 2021-09-10 LAB — CBC WITH DIFFERENTIAL/PLATELET
Abs Immature Granulocytes: 0.61 10*3/uL — ABNORMAL HIGH (ref 0.00–0.07)
Basophils Absolute: 0.1 10*3/uL (ref 0.0–0.1)
Basophils Relative: 1 %
Eosinophils Absolute: 0.4 10*3/uL (ref 0.0–0.5)
Eosinophils Relative: 2 %
HCT: 30.9 % — ABNORMAL LOW (ref 36.0–46.0)
Hemoglobin: 9.6 g/dL — ABNORMAL LOW (ref 12.0–15.0)
Immature Granulocytes: 4 %
Lymphocytes Relative: 14 %
Lymphs Abs: 2.3 10*3/uL (ref 0.7–4.0)
MCH: 29.9 pg (ref 26.0–34.0)
MCHC: 31.1 g/dL (ref 30.0–36.0)
MCV: 96.3 fL (ref 80.0–100.0)
Monocytes Absolute: 1.3 10*3/uL — ABNORMAL HIGH (ref 0.1–1.0)
Monocytes Relative: 8 %
Neutro Abs: 11.9 10*3/uL — ABNORMAL HIGH (ref 1.7–7.7)
Neutrophils Relative %: 71 %
Platelets: 266 10*3/uL (ref 150–400)
RBC: 3.21 MIL/uL — ABNORMAL LOW (ref 3.87–5.11)
RDW: 15.1 % (ref 11.5–15.5)
WBC: 16.6 10*3/uL — ABNORMAL HIGH (ref 4.0–10.5)
nRBC: 0.1 % (ref 0.0–0.2)

## 2021-09-10 LAB — MAGNESIUM: Magnesium: 1.7 mg/dL (ref 1.7–2.4)

## 2021-09-10 MED ORDER — FUROSEMIDE 10 MG/ML IJ SOLN
40.0000 mg | Freq: Once | INTRAMUSCULAR | Status: AC
  Administered 2021-09-10: 40 mg via INTRAVENOUS
  Filled 2021-09-10: qty 4

## 2021-09-10 MED ORDER — THIAMINE HCL 100 MG PO TABS
100.0000 mg | ORAL_TABLET | Freq: Every day | ORAL | 0 refills | Status: DC
  Filled 2021-09-10 – 2021-09-20 (×2): qty 30, 30d supply, fill #0

## 2021-09-10 MED ORDER — ADULT MULTIVITAMIN W/MINERALS CH: 1.0000 | ORAL_TABLET | Freq: Every day | ORAL | Status: DC

## 2021-09-10 MED ORDER — CEPHALEXIN 500 MG PO CAPS
1000.0000 mg | ORAL_CAPSULE | Freq: Four times a day (QID) | ORAL | 0 refills | Status: AC
  Filled 2021-09-10: qty 64, 8d supply, fill #0

## 2021-09-10 MED ORDER — TRIAMCINOLONE ACETONIDE 0.1 % EX CREA: 1.0000 | TOPICAL_CREAM | Freq: Two times a day (BID) | CUTANEOUS | Status: DC

## 2021-09-10 MED ORDER — FOLIC ACID 1 MG PO TABS
1.0000 mg | ORAL_TABLET | Freq: Every day | ORAL | 0 refills | Status: DC
  Filled 2021-09-10: qty 30, 30d supply, fill #0

## 2021-09-10 MED ORDER — CEPHALEXIN 500 MG PO CAPS
500.0000 mg | ORAL_CAPSULE | Freq: Three times a day (TID) | ORAL | 0 refills | Status: DC
  Filled 2021-09-10: qty 24, 8d supply, fill #0

## 2021-09-10 NOTE — Progress Notes (Signed)
Pt discharged to home at this time. [facility] to DC, Pt IV and tele was removed. Pt was given DC paperwork regarding medications, appointments, and condition. Pt verbalized understanding and stated no concerns at this time. Pt stable at time of DC and left in personal vehicle driven by husband.

## 2021-09-10 NOTE — TOC Transition Note (Addendum)
Transition of Care [facility]) - CM/SW Discharge Note   Patient Details  Name: [name] MRN: [id number] Date of Birth: [birth year]  [facility]) CM/SW Contact:  Leeroy Cha, RN Phone Number: 09/10/2021, 12:00 PM   Clinical Narrative:    Dcd to home with dme     Barriers to Discharge: Continued Medical Work up   Patient Goals and CMS Choice Patient states their goals for this hospitalization and ongoing recovery are:: to go home and get clean CMS Medicare.gov Compare Post Acute Care list provided to:: Patient Choice offered to / list presented to : Patient  Discharge Placement                       Discharge Plan and Services   Discharge Planning Services: CM Consult Post Acute Care Choice: Durable Medical Equipment          DME Arranged: Shower stool DME Agency: [employer] Date DME Agency Contacted: 09/09/21 Time DME Agency Contacted: [id number] Representative spoke with at DME Agency: danielle            Social Determinants of Health ([place]) Interventions     Readmission Risk Interventions     No data to display

## 2021-09-10 NOTE — Discharge Instructions (Signed)
Low-Sodium Nutrition Therapy Eating less sodium can help you if you have high blood pressure, heart failure, or kidney or liver disease. Your body needs a little sodium, but too much sodium can cause your body to hold onto extra water.  This extra water will raise your blood pressure and can cause damage to your heart, kidneys, or liver as they are forced to work harder. Sometimes you can see how the extra fluid affects you because your hands, legs, or belly swell.  You may also hold water around your heart and lungs, which makes it hard to breathe. Even if you take medication for blood pressure or a water pill (diuretic) to remove fluid, it is still important to have less salt in your diet. Check with your primary care provider before drinking alcohol since it may affect the amount of fluid in your body and how your heart, kidneys, or liver work.  Sodium in Food A low-sodium meal plan limits the sodium that you get from food and beverages to 1,500-2,000 milligrams (mg) per day. Salt is the main source of sodium.  Read the nutrition label on the package to find out how much sodium is in one serving of a food. Select foods with 140 milligrams (mg) of sodium or less per serving. You may be able to eat one or two servings of foods with a little more than 140 milligrams (mg) of sodium if you are closely watching how much sodium you eat in a day. Check the serving size on the label. The amount of sodium listed on the label shows the amount in one serving of the food. So, if you eat more than one serving, you will get more sodium than the amount listed.  Cutting Back on Sodium Eat more fresh foods. Fresh fruits and vegetables are low in sodium, as well as frozen vegetables and fruits that have no added juices or sauces. Fresh meats are lower in sodium than processed meats, such as bacon, sausage, and hotdogs. Not all processed foods are unhealthy, but some processed foods may have too much  sodium. Eat less salt at the table and when cooking.  One of the ingredients in salt is sodium. One teaspoon of table salt has 2,300 milligrams of sodium. Leave the salt out of recipes for pasta, casseroles, and soups. Be a smart shopper. Food packages that say "Salt-free", sodium-free", "very low sodium," and "low sodium" have less than 140 milligrams of sodium per serving. Beware of products identified as "Unsalted," "No Salt Added," "Reduced Sodium," or "Lower Sodium."  These items may still be high in sodium. You should always check the nutrition label. Add flavors to your food without adding sodium. Try lemon juice, lime juice, or vinegar. Dry or fresh herbs add flavor. Buy a sodium-free seasoning blend or make your own at home. You can purchase salt-free or sodium-free condiments like barbeque sauce in stores and online.   Eating in Restaurants Choose foods carefully when you eat outside your home. Restaurant foods can be very high in sodium.  Many restaurants provide nutrition facts on their menus or their websites. If you cannot find that information, ask your server.  Let your server know that you want your food to be cooked without salt and that you would like your salad dressing and sauces to be served on the side.  Foods Recommended Grains Bread and rolls without salted tops Homemade bread made with reduced-sodium baking powder Cold cereals, especially shredded wheat and puffed rice Oats, grits, or   cream of wheat Pastas, quinoa, and rice Popcorn, pretzels or crackers without salt Corn tortillas Protein Foods Fresh meats and fish (check the nutrition labels - make sure they are not packaged in a sodium solution) Canned or packed tuna (no more than 4 ounces at 1 serving) Beans and peas Soybeans and tofu Eggs Nuts or nut butters without salt Dairy Milk or milk powder Plant milks, such as rice and soy Yogurt, including Greek yogurt Small amounts of natural cheese  (blocks of cheese) or reduced-sodium cheese can be used in moderation.  (Swiss, ricotta, and fresh mozzarella cheese are lower in sodium than the others) Cream Cheese Low sodium cottage cheese Vegetables Fresh and frozen vegetables without added sauces or salt Homemade soups (without salt) Low-sodium, salt-free or sodium-free canned vegetables and soups Fruit Fresh and canned fruits Dried fruits, such as raisins, cranberries, and prunes Oils Tub or liquid margarine, regular or without salt Canola, corn, peanut, olive, safflower, or sunflower oils Condiments Fresh or dried herbs such as basil, bay leaf, dill, mustard (dry), nutmeg, paprika, parsley, rosemary, sage, or thyme. Low sodium ketchup Vinegar Lemon or lime juice Pepper, red pepper flakes, and cayenne. Hot sauce contains sodium, but if you use just a drop or two, it will not add up to much. Salt-free or sodium-free seasoning mixes and marinades Simple salad dressings: vinegar and oil  Foods Not Recommended Grains Breads or crackers topped with salt Cereals (hot/cold) with more than 300 mg sodium per serving Biscuits, cornbread, and other "quick" breads prepared with baking soda Pre-packaged bread crumbs Seasoned and packaged rice and pasta mixes Self-rising flours Protein Foods Cured meats: Bacon, ham, sausage, pepperoni and hot dogs Canned meats (chili, vienna sausage, or sardines) Smoked fish and meats Frozen meals that have more than 600 mg of sodium per serving Egg substitute (with added sodium) Dairy Buttermilk Processed cheese spreads Cottage cheese (1 cup may have over 500 mg of sodium; look for low-sodium.) American or feta cheese Shredded cheese has more sodium than blocks of cheese String cheese Vegetables Canned vegetables (unless they are salt-free, sodium-free or low sodium) Frozen vegetables with seasoning and sauces Sauerkraut and pickled vegetables Canned or dried soups (unless they are  salt-free, sodium-free, or low sodium) French fries and onion rings Fruit  Dried fruits preserved with additives that have sodium Oils  Salted butter or margarine, all types of olives Condiments Salt, sea salt, kosher salt, onion salt, and garlic salt Seasoning mixes with salt Bouillon cubes Ketchup Barbeque sauce and Worcestershire sauce unless low sodium Soy sauce Salsa, pickles, olives, relish Salad dressings: ranch, blue cheese, Italian, and French.  

## 2021-09-10 NOTE — Discharge Summary (Addendum)
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  She was also subsequently found to have severe sepsis with acute kidney injury with obstructive uropathy/pyelonephritis/right ureterolithiasis with hydronephrosis and E. coli bacteremia and started on IV fluids and antibiotics.  Creatinine worsened to 8.6 from a baseline of 1.  Urology and nephrology were consulted.  She was transferred to [facility] long hospital from [facility] per urology recommendations.  She underwent cystoscopy and stenting on 09/05/2021.  ID was also consulted.  She was subsequently switched to IV Ancef and ID recommended total 2 weeks course of antibiotics.  During the hospitalization, her kidney function has much improved.  She is currently afebrile and hemodynamically stable.  She will be discharged home today on oral antibiotics to finish 2 weeks course of therapy.  Outpatient follow-up with urology.  Discharge Diagnoses:   Severe sepsis Acute kidney injury UTI/pyelonephritis/obstructive uropathy/right ureterolithiasis with hydronephrosis E. coli bacteremia -Baseline creatinine of 1.  Presented with creatinine of 3.58  and worsened to 8.6.  Urology and nephrology were consulted.  Treated with IV fluids and subsequently discontinued. -Status post cystoscopy and right ureteral stent on 09/05/2021 by urology.  Urology recommend appropriate antibiotics and will follow up as an outpatient for definitive management management of stone -Currently on Ancef.  ID recommended total of 2 weeks course of antibiotic therapy on discharge and signed off on 09/07/2021. Nephrology signed off on 09/07/2021. -Creatinine improving to 1.3 today.  Off IV fluids.  Patient received 1 dose of IV Lasix for lower extremity edema on 09/09/2021.  We will give another dose of IV Lasix today. -Currently afebrile and hemodynamically stable and tolerating diet.  Sepsis has resolved.  Discharge home today on oral Keflex for another 8 days to finish 14-day course of therapy. -Outpatient follow-up with urology -Losartan hydrochlorothiazide will remain on hold  Hyperkalemia -Resolved   Leukocytosis -Slightly worsened today but patient is afebrile with no signs of new infection.  Discharge home today on antibiotics as above  Alcohol withdrawal Alcohol abuse -Treated with CIWA protocol.  Continue thiamine, folic acid and multivitamin on discharge.  TOC consulted for the same during the hospitalization.   Paroxysmal A-fib -Currently rate controlled.  Continue metoprolol succinate and apixaban.  Hyperlipidemia -Continue statin  Morbid obesity -Outpatient follow-up   History of pulmonary embolism -Continue Eliquis   Physical deconditioning -Will need home health PT  Hypertension -Blood pressure on the lower side.  Continue metoprolol.  Losartan hydrochlorothiazide has been discontinued.  Discharge Instructions  Discharge Instructions     Diet - low sodium heart healthy   Complete by: As directed    Increase activity slowly   Complete by: As directed       Allergies as of 09/10/2021       Reactions   Morphine Itching   Shrimp  [  shellfish Allergy] Itching, Other (See Comments)   Tongue burns also   Tramadol Other (See Comments)   Caused confusion   Covid-19 (mrna) Vaccine Hives   Diltiazem Hcl Itching   Pt with itching of the feet when bolus given   Other Other (See Comments)   Latex Rash        Medication List     STOP taking these medications    HYDROcodone-acetaminophen 5-325 MG tablet Commonly known as: NORCO/VICODIN   losartan-hydrochlorothiazide 100-25 MG tablet Commonly known as: HYZAAR       TAKE these medications    acetaminophen 500 MG tablet Commonly known as: TYLENOL Take 250 mg by mouth every 6 (six) hours as needed for headache or moderate pain.   albuterol 108 (90 Base) MCG/ACT inhaler Commonly known as: VENTOLIN HFA 2 puffs every 6 hrs as needed What changed:  how much to take how to take this when to take this reasons to take this additional instructions   alum & mag hydroxide-simeth 440-102-72 MG/5ML suspension Commonly known as: MAALOX/MYLANTA Take 30 mLs by mouth every 6 (six) hours as needed for indigestion or heartburn.   apixaban 5 MG Tabs tablet Commonly known as: ELIQUIS Take 1 tablet (5 mg total) by mouth 2 (two) times daily.   atorvastatin 20 MG tablet Commonly known as: Lipitor Take 1 tablet (20 mg total) by mouth daily.   cephALEXin 500 MG capsule Commonly known as: KEFLEX Take 2 capsules (1,000 mg total) by mouth 4 (four) times daily for 8 days.   folic acid 1 MG tablet Commonly known as: FOLVITE Take 1 tablet (1 mg total) by mouth daily. Start taking on: September 11, 2021   metoprolol succinate 25 MG 24 hr tablet Commonly known as: TOPROL-XL Take 1 tablet (25 mg total) by mouth daily.   multivitamin with minerals Tabs tablet Take 1 tablet by mouth daily. Start taking on: September 11, 2021   ondansetron 4 MG disintegrating tablet Commonly known as: ZOFRAN-ODT Take 1 tablet (4 mg total) by mouth every 8 (eight) hours as needed for nausea or  vomiting.   SYSTANE OP Place 1 drop into both eyes 2 (two) times daily as needed (dry eyes).   thiamine 100 MG tablet Commonly known as: VITAMIN B1 Take 1 tablet (100 mg total) by mouth daily. Start taking on: September 11, 2021   triamcinolone cream 0.1 % Commonly known as: KENALOG Apply 1 Application topically 2 (two) times daily.               Durable Medical Equipment  (From admission, onward)           Start     Ordered   09/09/21 1020  For home use only DME Shower stool  Once        09/09/21 1019              Follow-up Information     Biagio Borg, MD. Schedule an appointment as soon as possible for a visit in 1 week(s).   Specialties: Internal Medicine, Radiology Why: With repeat CBC/BMP Contact information: [place] [address] [phone]         Janith Lima, MD. Schedule an appointment as soon as possible for a visit in 1 week(s).   Specialty: Urology Contact information: [place] [address] [phone]                Allergies  Allergen Reactions   Morphine Itching  Shrimp [Shellfish Allergy] Itching and Other (See Comments)    Tongue burns also   Tramadol Other (See Comments)    Caused confusion   Covid-19 (Mrna) Vaccine Hives   Diltiazem Hcl Itching    Pt with itching of the feet when bolus given   Other Other (See Comments)   Latex Rash    Consultations: Nephrology/urology/ID   Procedures/Studies: DG C-Arm 1-60 Min-No Report  Result Date: 09/05/2021 Fluoroscopy was utilized by the requesting physician.  No radiographic interpretation.   CT ABDOMEN PELVIS WO CONTRAST  Result Date: 09/05/2021 CLINICAL DATA:  Sepsis, history of colovaginal fistula EXAM: CT ABDOMEN AND PELVIS WITHOUT CONTRAST TECHNIQUE: Multidetector CT imaging of the abdomen and pelvis was performed following the standard protocol without IV contrast. RADIATION DOSE REDUCTION: This exam was performed  according to the departmental dose-optimization program which includes automated exposure control, adjustment of the mA and/or kV according to patient size and/or use of iterative reconstruction technique. COMPARISON:  CT 06/13/2021 FINDINGS: Lower chest: Mild cardiomegaly. Aortic valve calcifications. Mild aortic atherosclerotic calcification. No pericardial effusion. Respiratory motion obscures detail in the lungs. Unchanged subcarinal lymph node complex measuring 2.5 x 1.9 cm. Hepatobiliary: Marked hepatic steatosis. Multiple enhancing liver lesions unchanged from prior and likely representing hepatic hemangiomas. No biliary ductal dilation. No cholelithiasis. Pancreas: Unremarkable. No pancreatic ductal dilatation or surrounding inflammatory changes. Spleen: Normal in size without focal abnormality. Adrenals/Urinary Tract: Adrenal glands are unremarkable. Nonobstructing calyceal stones measuring 2-3 mm in both kidneys. New 6 mm stone in the proximal right ureter mild upstream hydronephrosis and hydroureter slightly increased from 06/13/2021. Previously 2 mm stone at the right UVJ and 1.5 cm stone at the left UPJ are no longer visualized. The evaluation for pyelonephritis is limited without IV contrast. No significant perinephric stranding. The bladder is decompressed about a Foley catheter. Locules of gas in the bladder likely related to Foley catheter placement. Stomach/Bowel: Stomach is within normal limits. The appendix is normal. No evidence of bowel wall thickening, distention, or inflammatory changes. Colonic diverticulosis without diverticulitis. The sigmoid colon again appears tethered along the left aspect of the bladder near the tract of the prior colovaginal fistula. It remains indeterminate whether the fistula is patent. Vascular/Lymphatic: Aortic atherosclerotic calcification. No enlarged abdominal or pelvic lymph nodes. Reproductive: Hysterectomy. Bandlike soft tissue thickening again extends from  the upper left vaginal cuff to the left pelvic sidewall and again abuts the sigmoid colon (circa image 60 on series 6). Other: No free intraperitoneal fluid or air. Musculoskeletal: Advanced multilevel bridging anterior osteophytes of the thoracic spine with ankylosis compatible with DISH. IMPRESSION: 1. 6 mm stone in the proximal right ureter with mild upstream hydro nephrosis. Evaluation for pyelonephritis is limited without IV contrast. 2. Interval placement of a Foley catheter within the decompressed bladder. Gas within the bladder is presumed due to Foley catheter placement. However there is unchanged appearance of the chronic colovaginal/colovesical fistula. There remains indeterminate within the fistula is patent. 3. Enlarged severely steatotic liver.  Stable hemangiomas. Electronically Signed   By: Placido Sou M.D.   On: 09/05/2021 11:52   US RENAL  Result Date: 09/04/2021 CLINICAL DATA:  [id number].  Acute kidney injury EXAM: RENAL / URINARY TRACT ULTRASOUND COMPLETE COMPARISON:  CT abdomen pelvis 06/13/2021 FINDINGS: Right Kidney: Renal measurements: 13.7 x 7 x 5.9 cm = volume: 296 mL. Echogenicity within normal limits. No mass. Mild hydronephrosis visualized. Left Kidney: Renal measurements: 11.1 x 6 x 5.3 cm = volume: 184 mL. Echogenicity within normal limits.  No mass. Possible trace hydronephrosis visualized. Urinary bladder: Not visualized. Other: None. IMPRESSION: 1. Mild right hydronephrosis. 2. Possible trace left hydronephrosis. 3. Urinary bladder not visualized. Electronically Signed   By: Iven Finn M.D.   On: 09/04/2021 19:09   DG CHEST PORT 1 VIEW  Result Date: 09/04/2021 CLINICAL DATA:  Shortness of breath EXAM: PORTABLE CHEST 1 VIEW COMPARISON:  05/07/2021 FINDINGS: Transverse diameter heart is increased. Central pulmonary vessels are prominent. There are no signs of alveolar pulmonary edema or new focal infiltrates. Lateral CP angles are indistinct. There is no pneumothorax.  IMPRESSION: Cardiomegaly. Central pulmonary vessels are prominent suggesting CHF. There are no signs of alveolar pulmonary edema or focal pulmonary consolidation. Lateral costophrenic angles are indistinct which may be related to chest wall attenuation or pleural thickening or small effusions. Electronically Signed   By: Elmer Picker M.D.   On: 09/04/2021 18:05      Subjective: Patient seen and examined at bedside.  She feels much better but states that her legs are still swollen and would like to have another dose of Lasix before she leaves.  Feels okay to go home today.  Denies any overnight fever, vomiting or worsening shortness of breath.  Discharge Exam: Vitals:   09/10/21 0459 09/10/21 0829  BP: 127/80 128/70  Pulse: 85 92  Resp: 16   Temp: 98.4 F (36.9 C)   SpO2: 97% 100%    General: Pt is alert, awake, not in acute distress.  Currently on room air. Cardiovascular: rate controlled, S1/S2 + Respiratory: bilateral decreased breath sounds at bases with some scattered crackles Abdominal: Soft, morbidly obese, NT, ND, bowel sounds + Extremities: Bilateral lower extremity edema present; no cyanosis    The results of significant diagnostics from this hospitalization (including imaging, microbiology, ancillary and laboratory) are listed below for reference.     Microbiology: Recent Results (from the past 240 hour(s))  Urine Culture     Status: Abnormal   Collection Time: 09/04/21  5:16 PM   Specimen: Urine, Clean Catch  Result Value Ref Range Status   Specimen Description URINE, CLEAN CATCH  Final   Special Requests   Final    NONE Performed at [facility], [address] [place], [place] [address]    Culture 50,000 COLONIES/mL ESCHERICHIA COLI (A)  Final   Report Status 09/06/2021 FINAL  Final   Organism ID, Bacteria ESCHERICHIA COLI (A)  Final      Susceptibility   Escherichia coli - MIC*    AMPICILLIN >=32 RESISTANT Resistant     CEFAZOLIN <=4 SENSITIVE  Sensitive     CEFEPIME <=0.12 SENSITIVE Sensitive     CEFTRIAXONE <=0.25 SENSITIVE Sensitive     CIPROFLOXACIN <=0.25 SENSITIVE Sensitive     GENTAMICIN <=1 SENSITIVE Sensitive     IMIPENEM <=0.25 SENSITIVE Sensitive     NITROFURANTOIN <=16 SENSITIVE Sensitive     TRIMETH/SULFA <=20 SENSITIVE Sensitive     AMPICILLIN/SULBACTAM 16 INTERMEDIATE Intermediate     PIP/TAZO <=4 SENSITIVE Sensitive     * 50,000 COLONIES/mL ESCHERICHIA COLI  Culture, blood (Routine X 2) w Reflex to ID Panel     Status: Abnormal   Collection Time: 09/04/21  9:09 PM   Specimen: BLOOD  Result Value Ref Range Status   Specimen Description BLOOD RIGHT ANTECUBITAL  Final   Special Requests   Final    BOTTLES DRAWN AEROBIC AND ANAEROBIC Blood Culture adequate volume   Culture  Setup Time   Final  GRAM NEGATIVE RODS CRITICAL RESULT CALLED TO, READ BACK BY AND VERIFIED WITH: PHARMD AUSTIN PAYTES [id number] PM BY EC    Culture (A)  Final    ESCHERICHIA COLI SUSCEPTIBILITIES PERFORMED ON PREVIOUS CULTURE WITHIN THE LAST 5 DAYS. Performed at [facility], [place] [address] [place], [place] [address]    Report Status 09/07/2021 FINAL  Final  Culture, blood (Routine X 2) w Reflex to ID Panel     Status: Abnormal   Collection Time: 09/04/21  9:09 PM   Specimen: BLOOD LEFT HAND  Result Value Ref Range Status   Specimen Description BLOOD LEFT HAND  Final   Special Requests   Final    BOTTLES DRAWN AEROBIC AND ANAEROBIC Blood Culture adequate volume   Culture  Setup Time   Final    GRAM NEGATIVE RODS ANAEROBIC BOTTLE ONLY Organism ID to follow CRITICAL RESULT CALLED TO, READ BACK BY AND VERIFIED WITH: PHARMD AUSTIN PAYTES [id number] PM BY EC Performed at [facility], [place] [address], [place], [place] [address]    Culture ESCHERICHIA COLI (A)  Final   Report Status 09/07/2021 FINAL  Final   Organism ID, Bacteria ESCHERICHIA COLI  Final      Susceptibility   Escherichia coli - MIC*    AMPICILLIN <=2  SENSITIVE Sensitive     CEFAZOLIN <=4 SENSITIVE Sensitive     CEFEPIME <=0.12 SENSITIVE Sensitive     CEFTAZIDIME <=1 SENSITIVE Sensitive     CEFTRIAXONE <=0.25 SENSITIVE Sensitive     CIPROFLOXACIN <=0.25 SENSITIVE Sensitive     GENTAMICIN <=1 SENSITIVE Sensitive     IMIPENEM <=0.25 SENSITIVE Sensitive     TRIMETH/SULFA <=20 SENSITIVE Sensitive     AMPICILLIN/SULBACTAM <=2 SENSITIVE Sensitive     PIP/TAZO <=4 SENSITIVE Sensitive     * ESCHERICHIA COLI  Blood Culture ID Panel (Reflexed)     Status: Abnormal   Collection Time: 09/04/21  9:09 PM  Result Value Ref Range Status   Enterococcus faecalis NOT DETECTED NOT DETECTED Final   Enterococcus Faecium NOT DETECTED NOT DETECTED Final   Listeria monocytogenes NOT DETECTED NOT DETECTED Final   Staphylococcus species NOT DETECTED NOT DETECTED Final   Staphylococcus aureus (BCID) NOT DETECTED NOT DETECTED Final   Staphylococcus epidermidis NOT DETECTED NOT DETECTED Final   Staphylococcus lugdunensis NOT DETECTED NOT DETECTED Final   Streptococcus species NOT DETECTED NOT DETECTED Final   Streptococcus agalactiae NOT DETECTED NOT DETECTED Final   Streptococcus pneumoniae NOT DETECTED NOT DETECTED Final   Streptococcus pyogenes NOT DETECTED NOT DETECTED Final   A.calcoaceticus-baumannii NOT DETECTED NOT DETECTED Final   Bacteroides fragilis NOT DETECTED NOT DETECTED Final   Enterobacterales DETECTED (A) NOT DETECTED Final    Comment: Enterobacterales represent a large order of gram negative bacteria, not a single organism.   Enterobacter cloacae complex NOT DETECTED NOT DETECTED Final   Escherichia coli DETECTED (A) NOT DETECTED Final    Comment: CRITICAL RESULT CALLED TO, READ BACK BY AND VERIFIED WITH: PHARMD AUSTIN PAYTES [id number] PM BY EC    Klebsiella aerogenes NOT DETECTED NOT DETECTED Final   Klebsiella oxytoca NOT DETECTED NOT DETECTED Final   Klebsiella pneumoniae NOT DETECTED NOT DETECTED Final   Proteus species NOT  DETECTED NOT DETECTED Final   Salmonella species NOT DETECTED NOT DETECTED Final   Serratia marcescens NOT DETECTED NOT DETECTED Final   Haemophilus influenzae NOT DETECTED NOT DETECTED Final   Neisseria meningitidis NOT DETECTED NOT DETECTED Final  Pseudomonas aeruginosa NOT DETECTED NOT DETECTED Final   Stenotrophomonas maltophilia NOT DETECTED NOT DETECTED Final   Candida albicans NOT DETECTED NOT DETECTED Final   Candida auris NOT DETECTED NOT DETECTED Final   Candida glabrata NOT DETECTED NOT DETECTED Final   Candida krusei NOT DETECTED NOT DETECTED Final   Candida parapsilosis NOT DETECTED NOT DETECTED Final   Candida tropicalis NOT DETECTED NOT DETECTED Final   Cryptococcus neoformans/gattii NOT DETECTED NOT DETECTED Final   CTX-M ESBL NOT DETECTED NOT DETECTED Final   Carbapenem resistance IMP NOT DETECTED NOT DETECTED Final   Carbapenem resistance KPC NOT DETECTED NOT DETECTED Final   Carbapenem resistance NDM NOT DETECTED NOT DETECTED Final   Carbapenem resist OXA 48 LIKE NOT DETECTED NOT DETECTED Final   Carbapenem resistance VIM NOT DETECTED NOT DETECTED Final    Comment: Performed at [facility], [address], [place], [place] [address]  Urine Culture     Status: Abnormal   Collection Time: 09/05/21  7:16 PM   Specimen: Urine, Cystoscope  Result Value Ref Range Status   Specimen Description   Final    URINE, RANDOM Performed at [facility] [address], [place], [place] [address]    Special Requests   Final    NONE RIGHT RENAL PELVIS URINE CULTURE Performed at [facility] [address], [place], [place] [address]    Culture 50,000 COLONIES/mL ESCHERICHIA COLI (A)  Final   Report Status 09/07/2021 FINAL  Final   Organism ID, Bacteria ESCHERICHIA COLI (A)  Final      Susceptibility   Escherichia coli - MIC*    AMPICILLIN <=2 SENSITIVE Sensitive     CEFAZOLIN <=4 SENSITIVE Sensitive     CEFEPIME <=0.12 SENSITIVE  Sensitive     CEFTRIAXONE <=0.25 SENSITIVE Sensitive     CIPROFLOXACIN <=0.25 SENSITIVE Sensitive     GENTAMICIN <=1 SENSITIVE Sensitive     IMIPENEM <=0.25 SENSITIVE Sensitive     NITROFURANTOIN <=16 SENSITIVE Sensitive     TRIMETH/SULFA <=20 SENSITIVE Sensitive     AMPICILLIN/SULBACTAM <=2 SENSITIVE Sensitive     PIP/TAZO <=4 SENSITIVE Sensitive     * 50,000 COLONIES/mL ESCHERICHIA COLI  Culture, blood (Routine X 2) w Reflex to ID Panel     Status: None (Preliminary result)   Collection Time: 09/06/21 12:20 PM   Specimen: BLOOD  Result Value Ref Range Status   Specimen Description   Final    BLOOD RIGHT ANTECUBITAL Performed at [facility] [address], [place], [place] [address]    Special Requests   Final    BOTTLES DRAWN AEROBIC ONLY Blood Culture adequate volume Performed at [facility] [address] [place], [place] [address]    Culture   Final    NO GROWTH 4 DAYS Performed at [facility], [place] [address], [place], [place] [address]    Report Status PENDING  Incomplete  Culture, blood (Routine X 2) w Reflex to ID Panel     Status: None (Preliminary result)   Collection Time: 09/06/21 12:22 PM   Specimen: BLOOD  Result Value Ref Range Status   Specimen Description   Final    BLOOD BLOOD RIGHT WRIST Performed at [facility] [address], [place],  [address]    Special Requests   Final    BOTTLES DRAWN AEROBIC ONLY Blood Culture adequate volume Performed at [facility] [place] [address] [place],  [place] [address]    Culture   Final    NO GROWTH 4 DAYS Performed at [facility], [place] [address] [place], [place] [address]    Report Status PENDING  Incomplete     Labs: BNP (last 3 results) No results for input(s): "BNP" in the last 8760 hours. Basic Metabolic Panel: Recent Labs  Lab 09/06/21 0742 09/06/21 0752 09/06/21 1526 09/07/21 0401 09/08/21 0315  09/09/21 0331 09/10/21 0259  NA 135  --   --  143 143 140  142 140  K 6.6*  --  5.2* 4.5 4.1 3.8  3.8 3.8  CL 104  --   --  111 111 108  110 108  CO2 22  --   --  '26 26 23  25 25  '$ GLUCOSE 94  --   --  124* 107* 99  101* 120*  BUN 60*  --   --  54* 38* 25*  25* 25*  CREATININE 5.70*  --   --  3.01* 2.02* 1.40*  1.37* 1.30*  CALCIUM 8.8*  --   --  8.9 8.7* 8.4*  8.4* 8.5*  MG  --  2.0  --  1.9 1.7 1.6* 1.7  PHOS 6.3*  --   --  3.7 2.8 2.6 2.9   Liver Function Tests: Recent Labs  Lab 09/04/21 2109 09/05/21 1157 09/06/21 0742 09/07/21 0401 09/08/21 0315 09/09/21 0331 09/10/21 0259  AST 58*  --   --   --   --   --   --   ALT 46*  --   --   --   --   --   --   ALKPHOS 119  --   --   --   --   --   --   BILITOT 0.7  --   --   --   --   --   --   PROT 6.6  --   --   --   --   --   --   ALBUMIN 2.7*   < > 2.9* 2.6* 2.7* 2.6* 2.6*   < > = values in this interval not displayed.   No results for input(s): "LIPASE", "AMYLASE" in the last 168 hours. No results for input(s): "AMMONIA" in the last 168 hours. CBC: Recent Labs  Lab 09/04/21 2109 09/05/21 2139 09/06/21 0353 09/07/21 0401 09/08/21 0315 09/09/21 0331 09/10/21 0259  WBC 28.2*  --  29.4* 18.5* 13.5* 13.2* 16.6*  NEUTROABS 26.2* 26.6*  --   --   --  9.1* 11.9*  HGB 11.2*  --  11.4* 10.2* 9.9* 10.1* 9.6*  HCT 34.5*  --  36.4 32.7* 31.9* 32.2* 30.9*  MCV 92.5  --  95.5 95.9 95.8 95.3 96.3  PLT 188  --  157 154 185 228 266   Cardiac Enzymes: Recent Labs  Lab 09/05/21 0950  CKTOTAL 426*   BNP: Invalid input(s): "POCBNP" CBG: Recent Labs  Lab 09/05/21 1939  GLUCAP 134*   D-Dimer No results for input(s): "DDIMER" in the last 72 hours. Hgb A1c No results for input(s): "HGBA1C" in the last 72 hours. Lipid Profile No results for input(s): "CHOL", "HDL", "LDLCALC", "TRIG", "CHOLHDL", "LDLDIRECT" in the last 72 hours. Thyroid function studies No results for input(s): "TSH", "T4TOTAL", "T3FREE",  "THYROIDAB" in the last 72 hours.  Invalid input(s): "FREET3" Anemia work up No results for input(s): "VITAMINB12", "FOLATE", "FERRITIN", "TIBC", "IRON", "RETICCTPCT" in the last 72 hours. Urinalysis    Component  Value Date/Time   COLORURINE STRAW (A) 09/04/2021 1716   APPEARANCEUR CLEAR 09/04/2021 1716   LABSPEC 1.005 09/04/2021 1716   PHURINE 8.0 09/04/2021 1716   GLUCOSEU NEGATIVE 09/04/2021 1716   GLUCOSEU NEGATIVE 03/11/2021 0836   HGBUR MODERATE (A) 09/04/2021 1716   BILIRUBINUR NEGATIVE 09/04/2021 1716   BILIRUBINUR negative 09/16/2011 1047   KETONESUR NEGATIVE 09/04/2021 1716   PROTEINUR NEGATIVE 09/04/2021 1716   UROBILINOGEN 0.2 03/11/2021 0836   NITRITE NEGATIVE 09/04/2021 1716   LEUKOCYTESUR LARGE (A) 09/04/2021 1716   Sepsis Labs Recent Labs  Lab 09/07/21 0401 09/08/21 0315 09/09/21 0331 09/10/21 0259  WBC 18.5* 13.5* 13.2* 16.6*   Microbiology Recent Results (from the past 240 hour(s))  Urine Culture     Status: Abnormal   Collection Time: 09/04/21  5:16 PM   Specimen: Urine, Clean Catch  Result Value Ref Range Status   Specimen Description URINE, CLEAN CATCH  Final   Special Requests   Final    NONE Performed at [facility], [place] [address], [place], [place] [address]    Culture 50,000 COLONIES/mL ESCHERICHIA COLI (A)  Final   Report Status 09/06/2021 FINAL  Final   Organism ID, Bacteria ESCHERICHIA COLI (A)  Final      Susceptibility   Escherichia coli - MIC*    AMPICILLIN >=32 RESISTANT Resistant     CEFAZOLIN <=4 SENSITIVE Sensitive     CEFEPIME <=0.12 SENSITIVE Sensitive     CEFTRIAXONE <=0.25 SENSITIVE Sensitive     CIPROFLOXACIN <=0.25 SENSITIVE Sensitive     GENTAMICIN <=1 SENSITIVE Sensitive     IMIPENEM <=0.25 SENSITIVE Sensitive     NITROFURANTOIN <=16 SENSITIVE Sensitive     TRIMETH/SULFA <=20 SENSITIVE Sensitive     AMPICILLIN/SULBACTAM 16 INTERMEDIATE Intermediate     PIP/TAZO <=4 SENSITIVE Sensitive     * 50,000 COLONIES/mL  ESCHERICHIA COLI  Culture, blood (Routine X 2) w Reflex to ID Panel     Status: Abnormal   Collection Time: 09/04/21  9:09 PM   Specimen: BLOOD  Result Value Ref Range Status   Specimen Description BLOOD RIGHT ANTECUBITAL  Final   Special Requests   Final    BOTTLES DRAWN AEROBIC AND ANAEROBIC Blood Culture adequate volume   Culture  Setup Time   Final    GRAM NEGATIVE RODS CRITICAL RESULT CALLED TO, READ BACK BY AND VERIFIED WITH: PHARMD AUSTIN PAYTES [id number] PM BY EC    Culture (A)  Final    ESCHERICHIA COLI SUSCEPTIBILITIES PERFORMED ON PREVIOUS CULTURE WITHIN THE LAST 5 DAYS. Performed at [facility], [place] [address] [place], [place] [address]    Report Status 09/07/2021 FINAL  Final  Culture, blood (Routine X 2) w Reflex to ID Panel     Status: Abnormal   Collection Time: 09/04/21  9:09 PM   Specimen: BLOOD LEFT HAND  Result Value Ref Range Status   Specimen Description BLOOD LEFT HAND  Final   Special Requests   Final    BOTTLES DRAWN AEROBIC AND ANAEROBIC Blood Culture adequate volume   Culture  Setup Time   Final    GRAM NEGATIVE RODS ANAEROBIC BOTTLE ONLY Organism ID to follow CRITICAL RESULT CALLED TO, READ BACK BY AND VERIFIED WITH: PHARMD AUSTIN PAYTES [id number] PM BY EC Performed at [facility], [place] [address] [place],  [address]    Culture ESCHERICHIA COLI (A)  Final   Report Status 09/07/2021 FINAL  Final   Organism ID,  Bacteria ESCHERICHIA COLI  Final      Susceptibility   Escherichia coli - MIC*    AMPICILLIN <=2 SENSITIVE Sensitive     CEFAZOLIN <=4 SENSITIVE Sensitive     CEFEPIME <=0.12 SENSITIVE Sensitive     CEFTAZIDIME <=1 SENSITIVE Sensitive     CEFTRIAXONE <=0.25 SENSITIVE Sensitive     CIPROFLOXACIN <=0.25 SENSITIVE Sensitive     GENTAMICIN <=1 SENSITIVE Sensitive     IMIPENEM <=0.25 SENSITIVE Sensitive     TRIMETH/SULFA <=20 SENSITIVE Sensitive     AMPICILLIN/SULBACTAM <=2 SENSITIVE Sensitive     PIP/TAZO <=4  SENSITIVE Sensitive     * ESCHERICHIA COLI  Blood Culture ID Panel (Reflexed)     Status: Abnormal   Collection Time: 09/04/21  9:09 PM  Result Value Ref Range Status   Enterococcus faecalis NOT DETECTED NOT DETECTED Final   Enterococcus Faecium NOT DETECTED NOT DETECTED Final   Listeria monocytogenes NOT DETECTED NOT DETECTED Final   Staphylococcus species NOT DETECTED NOT DETECTED Final   Staphylococcus aureus (BCID) NOT DETECTED NOT DETECTED Final   Staphylococcus epidermidis NOT DETECTED NOT DETECTED Final   Staphylococcus lugdunensis NOT DETECTED NOT DETECTED Final   Streptococcus species NOT DETECTED NOT DETECTED Final   Streptococcus agalactiae NOT DETECTED NOT DETECTED Final   Streptococcus pneumoniae NOT DETECTED NOT DETECTED Final   Streptococcus pyogenes NOT DETECTED NOT DETECTED Final   A.calcoaceticus-baumannii NOT DETECTED NOT DETECTED Final   Bacteroides fragilis NOT DETECTED NOT DETECTED Final   Enterobacterales DETECTED (A) NOT DETECTED Final    Comment: Enterobacterales represent a large order of gram negative bacteria, not a single organism.   Enterobacter cloacae complex NOT DETECTED NOT DETECTED Final   Escherichia coli DETECTED (A) NOT DETECTED Final    Comment: CRITICAL RESULT CALLED TO, READ BACK BY AND VERIFIED WITH: PHARMD AUSTIN PAYTES [id number] PM BY EC    Klebsiella aerogenes NOT DETECTED NOT DETECTED Final   Klebsiella oxytoca NOT DETECTED NOT DETECTED Final   Klebsiella pneumoniae NOT DETECTED NOT DETECTED Final   Proteus species NOT DETECTED NOT DETECTED Final   Salmonella species NOT DETECTED NOT DETECTED Final   Serratia marcescens NOT DETECTED NOT DETECTED Final   Haemophilus influenzae NOT DETECTED NOT DETECTED Final   Neisseria meningitidis NOT DETECTED NOT DETECTED Final   Pseudomonas aeruginosa NOT DETECTED NOT DETECTED Final   Stenotrophomonas maltophilia NOT DETECTED NOT DETECTED Final   Candida albicans NOT DETECTED NOT DETECTED Final    Candida auris NOT DETECTED NOT DETECTED Final   Candida glabrata NOT DETECTED NOT DETECTED Final   Candida krusei NOT DETECTED NOT DETECTED Final   Candida parapsilosis NOT DETECTED NOT DETECTED Final   Candida tropicalis NOT DETECTED NOT DETECTED Final   Cryptococcus neoformans/gattii NOT DETECTED NOT DETECTED Final   CTX-M ESBL NOT DETECTED NOT DETECTED Final   Carbapenem resistance IMP NOT DETECTED NOT DETECTED Final   Carbapenem resistance KPC NOT DETECTED NOT DETECTED Final   Carbapenem resistance NDM NOT DETECTED NOT DETECTED Final   Carbapenem resist OXA 48 LIKE NOT DETECTED NOT DETECTED Final   Carbapenem resistance VIM NOT DETECTED NOT DETECTED Final    Comment: Performed at [facility], [address] [place], [place] [address]  Urine Culture     Status: Abnormal   Collection Time: 09/05/21  7:16 PM   Specimen: Urine, Cystoscope  Result Value Ref Range Status   Specimen Description   Final    URINE, RANDOM Performed at [facility],  [place] [address], [place], [place] [address]    Special Requests   Final    NONE RIGHT RENAL PELVIS URINE CULTURE Performed at [place] [address], [place], [place] [address]    Culture 50,000 COLONIES/mL ESCHERICHIA COLI (A)  Final   Report Status 09/07/2021 FINAL  Final   Organism ID, Bacteria ESCHERICHIA COLI (A)  Final      Susceptibility   Escherichia coli - MIC*    AMPICILLIN <=2 SENSITIVE Sensitive     CEFAZOLIN <=4 SENSITIVE Sensitive     CEFEPIME <=0.12 SENSITIVE Sensitive     CEFTRIAXONE <=0.25 SENSITIVE Sensitive     CIPROFLOXACIN <=0.25 SENSITIVE Sensitive     GENTAMICIN <=1 SENSITIVE Sensitive     IMIPENEM <=0.25 SENSITIVE Sensitive     NITROFURANTOIN <=16 SENSITIVE Sensitive     TRIMETH/SULFA <=20 SENSITIVE Sensitive     AMPICILLIN/SULBACTAM <=2 SENSITIVE Sensitive     PIP/TAZO <=4 SENSITIVE Sensitive     * 50,000 COLONIES/mL ESCHERICHIA COLI  Culture, blood (Routine X  2) w Reflex to ID Panel     Status: None (Preliminary result)   Collection Time: 09/06/21 12:20 PM   Specimen: BLOOD  Result Value Ref Range Status   Specimen Description   Final    BLOOD RIGHT ANTECUBITAL Performed at [place] [address], [place], [place] [address]    Special Requests   Final    BOTTLES DRAWN AEROBIC ONLY Blood Culture adequate volume Performed at [place] [address] [place], [place] [address]    Culture   Final    NO GROWTH 4 DAYS Performed at [facility], [place] [address] [place], [place] [address]    Report Status PENDING  Incomplete  Culture, blood (Routine X 2) w Reflex to ID Panel     Status: None (Preliminary result)   Collection Time: 09/06/21 12:22 PM   Specimen: BLOOD  Result Value Ref Range Status   Specimen Description   Final    BLOOD BLOOD RIGHT WRIST Performed at [place] [address], [place], [place] [address]    Special Requests   Final    BOTTLES DRAWN AEROBIC ONLY Blood Culture adequate volume Performed at [place] [address], [place], [place] [address]    Culture   Final    NO GROWTH 4 DAYS Performed at [facility], [place] [address] [place], [place] [address]    Report Status PENDING  Incomplete     Time coordinating discharge: 35 minutes  SIGNED:   Aline August, MD  [facility] 09/10/2021, 9:58 AM

## 2021-09-10 NOTE — Progress Notes (Signed)
Nutrition Follow-up  DOCUMENTATION CODES:   Morbid obesity  INTERVENTION:  - continue double protein portions at meals. - will add Low Sodium Nutrition Therapy handout to AVS.  NUTRITION DIAGNOSIS:   Increased nutrient needs related to acute illness as evidenced by estimated needs. -ongoing  GOAL:   Patient will meet greater than or equal to 90% of their needs -met   MONITOR:   PO intake, Supplement acceptance, Labs, Weight trends  ASSESSMENT:   Pt admitted with ETOH withdrawal. PMH significant for ETOH dependence, afib, HTN, HLD and morbid obesity.  Patient has been eating 100% at meals since lunch on 7/28 to include breakfast this AM.   Weight on 7/24 was 298 lb and weight yesterday was 309 lb. Weight yesterday was the highest recorded in the past 14 months and may not be fully accurate.   Discharge order and summary entered earlier this AM.     Labs reviewed; BUN: 25 mg/dl, creatinine: 1.3 mg/dl, Ca: 8.5 mg/dl, GFR: 47 ml/min.  Medications reviewed; 100 mg colace BID, 1 mg folvite/day, 40 mg IV lasix x1 dose /1, 60 mg IV lasix x1 dose 7/31, 2 g IV Mg sulfate x1 run 7/31, 1 tablet multivitamin with minerals/day, 40 mg oral protonix/day, 100 mg oral thiamine/day.    Diet Order:   Diet Order             Diet - low sodium heart healthy           Diet Heart Room service appropriate? Yes; Fluid consistency: Thin  Diet effective now                   EDUCATION NEEDS:   Education needs have been addressed  Skin:  Skin Assessment: Reviewed RN Assessment  Last BM:  7/28 (type 4 x1, medium amount)  Height:   Ht Readings from Last 1 Encounters:  09/02/21 '5\' 1"'  (1.549 m)    Weight:   Wt Readings from Last 1 Encounters:  09/09/21 (!) 140.2 kg    Ideal Body Weight:  47.7 kg  BMI:  Body mass index is 58.39 kg/m.  Estimated Nutritional Needs:  Kcal:  1400-1600 Protein:  70-85g Fluid:  >/=1.5L     Jarome Matin, MS, RD, LDN, CNSC Registered  Dietitian II Inpatient Clinical Nutrition RD pager # and on-call/weekend pager # available in [place]

## 2021-09-10 NOTE — Consult Note (Signed)
   [facility] Inpatient Consult   09/10/2021  [name] November 01, 1960 [id number]  [place] [facility] [ACO] Patient: [name]   Follow up call:  *Remote coverage patient at [facility] for extensive care coordination needs  Patient evaluated for community based chronic complex disease management services with [place] [facility] as a benefit of patient's Insurance. Spoke with patient via hospital phone and  HIPAA verified with 2 identifiers to explain [place] [facility] services.  Patient states, "I have been so out of it the past few days but I am alert now and doing well and ready to get myself together.  Explained that the patient qualifies for complex disease management as a benefit as well as care coordination services.  Patient spoke at length to follow up needs.  Patient states she is currently out of work but needs to see if she qualifies for more benefits.  She states her husband works but only 2 days a week.  She gets $84.00 in food stamps but still finding it hard at times.  Patient agrees to post hospital care coordination.    Patient will receive post hospital discharge call and will be evaluated for complex disease management.      Of note, [facility] Care Management services does not replace or interfere with any services that are arranged by inpatient case management or social work.  For additional questions or referrals please contact:    Natividad Brood, RN BSN [facility] Liaison  [phone] business mobile phone Toll free office [phone]  Fax number: [phone] [email or web address]  [email or web address]

## 2021-09-11 ENCOUNTER — Other Ambulatory Visit (HOSPITAL_COMMUNITY): Payer: Self-pay

## 2021-09-11 ENCOUNTER — Telehealth: Payer: Self-pay | Admitting: *Deleted

## 2021-09-11 ENCOUNTER — Other Ambulatory Visit: Payer: Self-pay | Admitting: Urology

## 2021-09-11 LAB — CULTURE, BLOOD (ROUTINE X 2)
Culture: NO GROWTH
Culture: NO GROWTH
Special Requests: ADEQUATE
Special Requests: ADEQUATE

## 2021-09-11 NOTE — Chronic Care Management (AMB) (Signed)
  Care Coordination   Note   09/11/2021 Name: [name] MRN: [id number] DOB: 12-28-1960  [name] is a 61 y.o. year old female who sees Biagio Borg, MD for primary care. I reached out to [name] by phone today to offer care coordination services.  [name] was given information about Care Coordination services today including:   The Care Coordination services include support from the care team which includes your Nurse Coordinator, Clinical Social Worker, or Pharmacist.  The Care Coordination team is here to help remove barriers to the health concerns and goals most important to you. Care Coordination services are voluntary, and the patient may decline or stop services at any time by request to their care team member.   Care Coordination Consent Status: Patient agreed to services and verbal consent obtained.   Follow up plan:  Telephone appointment with care coordination team member scheduled for:  09/12/2021  Encounter Outcome:  Pt. Scheduled  Julian Hy, [place] Direct Dial: [phone]

## 2021-09-12 ENCOUNTER — Ambulatory Visit: Payer: Self-pay

## 2021-09-12 NOTE — Patient Outreach (Signed)
  Care Coordination   Initial Visit Note   09/12/2021 Name: [name] MRN: [id number] DOB: 19-Feb-1960  [name] is a 61 y.o. year old female who sees Biagio Borg, MD for primary care. I spoke with  [name] by phone today  What matters to the patients health and wellness today?  Loose weight. Also reports some difficulty affording medications Albuterol and Losartan    Goals Addressed             This Visit's Progress    Patient Stated: loose weight       Care Coordination Interventions: Provided verbal and/or written education to patient re: provider recommended life style modifications  Advised patient to discuss questions/concerns  with provider Assessed social determinant of health barriers Encouraged increase exercise as recommended by providers Referral to Pharmacy re: medication assistance             SDOH assessments and interventions completed:  Yes  SDOH Interventions Today    Flowsheet Row Most Recent Value  SDOH Interventions   Food Insecurity Interventions Intervention Not Indicated  [reports does not need resources at this time]  Transportation Interventions Intervention Not Indicated        Care Coordination Interventions Activated:  Yes  Care Coordination Interventions:  Yes, provided   Follow up plan: Follow up call scheduled for 10/07/21    Encounter Outcome:  Pt. Visit Completed   Thea Silversmith, RN, MSN, BSN, [place] Coordinator [phone]

## 2021-09-20 ENCOUNTER — Telehealth: Payer: Self-pay

## 2021-09-20 ENCOUNTER — Other Ambulatory Visit: Payer: Self-pay | Admitting: Internal Medicine

## 2021-09-20 ENCOUNTER — Other Ambulatory Visit (HOSPITAL_COMMUNITY): Payer: Self-pay

## 2021-09-20 DIAGNOSIS — Z596 Low income: Secondary | ICD-10-CM

## 2021-09-20 MED ORDER — FOLIC ACID 1 MG PO TABS
1.0000 mg | ORAL_TABLET | Freq: Every day | ORAL | 0 refills | Status: DC
Start: 2021-09-20 — End: 2021-11-06
  Filled 2021-09-20 – 2021-10-24 (×3): qty 30, 30d supply, fill #0

## 2021-09-20 NOTE — Progress Notes (Signed)
[facility])  [place] Team    09/20/2021  [name] 03/12/1960 [id number]  Reason for referral: Medication Assistance with generics losartan/HCTZ and albuterol  Referral source: [facility] RN Current insurance: [employer] - commercial   Outreach:  Successful telephone call with [name].  HIPAA identifiers verified.   Brief Summary:  [place] received a referral for medication assistance with albuterol and losartan/HCTZ. Patient has [employer] with a high deductible. Per [facility], losartan/HCTZ $23.53 for 30 days and albuterol (1 box) $23.74.Per chart review, losartan/HCTZ was held on discharge and no appointment scheduled with PCP since discharge. Patient reported her BP has been fine at home. I explained to patient losartan/HCTZ could have been held d/t labs results related to electrolytes, kidney function or other reasons. Advised pt to r/o to PCP to discuss whether use of losartan/HCTZ.  Pt already has a GoodRx coupon that she received from her doctor's office but said it was not helpful for albuterol. Discussed possibly transferring prescription to [facility], [facility], or [facility] since was it is $8-9 at these locations for albuterol. Explained to patient the quoted cost is an estimate. I asked patient to lookup a pharmacy she prefers and transfer prescription should she proceed with using GoodRx. She does not have an email address and may as son for help if she needs to signup for a GoodRx account. Will f/u next week to see if she needs help or had any issues.  Notably, patient has not filled Eliquis since January and has been taking medication once daily instead of twice daily to extend use. Additionally, she reports taking a family members Eliquis 5 mg tablets since their prescription was discontinued and had 2 full bottles (120 tablets) left. This pharmacist verified expiration date of medication as 02/2023 and strength of Eliquis  ('5mg'$ ), per pt report. Educated pt that she was not receiving therapeutic dose of Eliquis and the risks associated. Provided education on not using other's medication - pt verified understanding even though she reports being fully aware she should not.  Per [facility], Eliquis cost $10 with insurance and copay card.    Plan: Will route note to Dr. Jenny [name] (PCP) re: above issue.   Thank you for allowing pharmacy to be a part of this patient's care. Kristeen Miss, PharmD Clinical Pharmacist [place] Cell: [phone]

## 2021-09-23 NOTE — Patient Instructions (Signed)
DUE TO COVID-19 ONLY TWO VISITORS  (aged 60 and older)  ARE ALLOWED TO COME WITH YOU AND STAY IN THE WAITING ROOM ONLY DURING PRE OP AND PROCEDURE.   **NO VISITORS ARE ALLOWED IN THE SHORT STAY AREA OR RECOVERY ROOM!!**  IF YOU WILL BE ADMITTED INTO THE HOSPITAL YOU ARE ALLOWED ONLY FOUR SUPPORT PEOPLE DURING VISITATION HOURS ONLY (7 AM -8PM)   The support person(s) must pass our screening, gel in and out, and wear a mask at all times, including in the patient's room. Patients must also wear a mask when staff or their support person are in the room. Visitors GUEST BADGE MUST BE WORN VISIBLY  One adult visitor may remain with you overnight and MUST be in the room by 8 P.M.     Your procedure is scheduled on: 09/30/21   Report to [facility] Main Entrance    Report to admitting at: 9:45 AM   Call this number if you have problems the morning of surgery [phone]   Do not eat food :After Midnight.   After Midnight you may have the following liquids until : 9:00 AM DAY OF SURGERY  Water Black Coffee (sugar ok, NO MILK/CREAM OR CREAMERS)  Tea (sugar ok, NO MILK/CREAM OR CREAMERS) regular and decaf                             Plain Jell-O (NO RED)                                           Fruit ices (not with fruit pulp, NO RED)                                     Popsicles (NO RED)                                                                  Juice: apple, WHITE grape, WHITE cranberry Sports drinks like Gatorade (NO RED)             FOLLOW BOWEL PREP AND ANY ADDITIONAL PRE OP INSTRUCTIONS YOU RECEIVED FROM YOUR SURGEON'S OFFICE!!!   Oral Hygiene is also important to reduce your risk of infection.                                    Remember - BRUSH YOUR TEETH THE MORNING OF SURGERY WITH YOUR REGULAR TOOTHPASTE   Do NOT smoke after Midnight   Take these medicines the morning of surgery with A SIP OF WATER: metoprolol.Use inhalers as usual.Tylenol as needed.  DO NOT TAKE  ANY ORAL DIABETIC MEDICATIONS DAY OF YOUR SURGERY  Bring CPAP mask and tubing day of surgery.                              You may not have any metal on your body including hair pins, jewelry, and body  piercing             Do not wear make-up, lotions, powders, perfumes/cologne, or deodorant  Do not wear nail polish including gel and S&S, artificial/acrylic nails, or any other type of covering on natural nails including finger and toenails. If you have artificial nails, gel coating, etc. that needs to be removed by a nail salon please have this removed prior to surgery or surgery may need to be canceled/ delayed if the surgeon/ anesthesia feels like they are unable to be safely monitored.   Do not shave  48 hours prior to surgery.    Do not bring valuables to the hospital. [place].   Contacts, dentures or bridgework may not be worn into surgery.   Bring small overnight bag day of surgery.   DO NOT [place]. PHARMACY WILL DISPENSE MEDICATIONS LISTED ON YOUR MEDICATION LIST TO YOU DURING YOUR ADMISSION [place]!    Patients discharged on the day of surgery will not be allowed to drive home.  Someone NEEDS to stay with you for the first 24 hours after anesthesia.   Special Instructions: Bring a copy of your healthcare power of attorney and living will documents         the day of surgery if you haven't scanned them before.              Please read over the following fact sheets you were given: IF YOU HAVE QUESTIONS ABOUT YOUR PRE-OP INSTRUCTIONS PLEASE CALL [phone]     [facility] - Preparing for Surgery Before surgery, you can play an important role.  Because skin is not sterile, your skin needs to be as free of germs as possible.  You can reduce the number of germs on your skin by washing with CHG (chlorahexidine gluconate) soap before surgery.  CHG is an antiseptic cleaner which kills germs  and bonds with the skin to continue killing germs even after washing. Please DO NOT use if you have an allergy to CHG or antibacterial soaps.  If your skin becomes reddened/irritated stop using the CHG and inform your nurse when you arrive at Short Stay. Do not shave (including legs and underarms) for at least 48 hours prior to the first CHG shower.  You may shave your face/neck. Please follow these instructions carefully:  1.  Shower with CHG Soap the night before surgery and the  morning of Surgery.  2.  If you choose to wash your hair, wash your hair first as usual with your  normal  shampoo.  3.  After you shampoo, rinse your hair and body thoroughly to remove the  shampoo.                           4.  Use CHG as you would any other liquid soap.  You can apply chg directly  to the skin and wash                       Gently with a scrungie or clean washcloth.  5.  Apply the CHG Soap to your body ONLY FROM THE NECK DOWN.   Do not use on face/ open  Wound or open sores. Avoid contact with eyes, ears mouth and genitals (private parts).                       Wash face,  Genitals (private parts) with your normal soap.             6.  Wash thoroughly, paying special attention to the area where your surgery  will be performed.  7.  Thoroughly rinse your body with warm water from the neck down.  8.  DO NOT shower/wash with your normal soap after using and rinsing off  the CHG Soap.                9.  Pat yourself dry with a clean towel.            10.  Wear clean pajamas.            11.  Place clean sheets on your bed the night of your first shower and do not  sleep with pets. Day of Surgery : Do not apply any lotions/deodorants the morning of surgery.  Please wear clean clothes to the hospital/surgery center.  FAILURE TO FOLLOW THESE INSTRUCTIONS MAY RESULT IN THE CANCELLATION OF YOUR SURGERY PATIENT SIGNATURE_________________________________  NURSE  SIGNATURE__________________________________  ________________________________________________________________________

## 2021-09-25 ENCOUNTER — Telehealth: Payer: Self-pay | Admitting: Internal Medicine

## 2021-09-25 ENCOUNTER — Encounter (HOSPITAL_COMMUNITY): Payer: Self-pay

## 2021-09-25 ENCOUNTER — Encounter (HOSPITAL_COMMUNITY)
Admission: RE | Admit: 2021-09-25 | Discharge: 2021-09-25 | Disposition: A | Discharge status: Home / Self Care | Payer: 59 | Source: Ambulatory Visit | Attending: Urology | Admitting: Urology

## 2021-09-25 ENCOUNTER — Other Ambulatory Visit: Payer: Self-pay

## 2021-09-25 ENCOUNTER — Other Ambulatory Visit: Payer: Self-pay | Admitting: Internal Medicine

## 2021-09-25 VITALS — BP 149/83 | HR 91 | Temp 97.7°F | Ht 61.0 in | Wt 279.0 lb

## 2021-09-25 DIAGNOSIS — Z01812 Encounter for preprocedural laboratory examination: Secondary | ICD-10-CM | POA: Insufficient documentation

## 2021-09-25 DIAGNOSIS — I10 Essential (primary) hypertension: Secondary | ICD-10-CM | POA: Insufficient documentation

## 2021-09-25 DIAGNOSIS — N201 Calculus of ureter: Secondary | ICD-10-CM | POA: Diagnosis not present

## 2021-09-25 DIAGNOSIS — Z1231 Encounter for screening mammogram for malignant neoplasm of breast: Secondary | ICD-10-CM

## 2021-09-25 HISTORY — DX: Personal history of urinary calculi: Z87.442

## 2021-09-25 HISTORY — DX: Cardiac arrhythmia, unspecified: I49.9

## 2021-09-25 HISTORY — DX: Peripheral vascular disease, unspecified: I73.9

## 2021-09-25 HISTORY — DX: Other pulmonary embolism without acute cor pulmonale: I26.99

## 2021-09-25 HISTORY — DX: Dyspnea, unspecified: R06.00

## 2021-09-25 HISTORY — DX: Pneumonia, unspecified organism: J18.9

## 2021-09-25 LAB — CBC
HCT: 39.1 % (ref 36.0–46.0)
Hemoglobin: 12.2 g/dL (ref 12.0–15.0)
MCH: 29.5 pg (ref 26.0–34.0)
MCHC: 31.2 g/dL (ref 30.0–36.0)
MCV: 94.4 fL (ref 80.0–100.0)
Platelets: 310 10*3/uL (ref 150–400)
RBC: 4.14 MIL/uL (ref 3.87–5.11)
RDW: 14.7 % (ref 11.5–15.5)
WBC: 14.8 10*3/uL — ABNORMAL HIGH (ref 4.0–10.5)
nRBC: 0 % (ref 0.0–0.2)

## 2021-09-25 LAB — BASIC METABOLIC PANEL
Anion gap: 10 (ref 5–15)
BUN: 25 mg/dL — ABNORMAL HIGH (ref 6–20)
CO2: 24 mmol/L (ref 22–32)
Calcium: 9.5 mg/dL (ref 8.9–10.3)
Chloride: 107 mmol/L (ref 98–111)
Creatinine, Ser: 1.31 mg/dL — ABNORMAL HIGH (ref 0.44–1.00)
GFR, Estimated: 47 mL/min — ABNORMAL LOW (ref >60)
Glucose, Bld: 119 mg/dL — ABNORMAL HIGH (ref 70–99)
Potassium: 4 mmol/L (ref 3.5–5.1)
Sodium: 141 mmol/L (ref 135–145)

## 2021-09-25 NOTE — Progress Notes (Signed)
For Short Stay: [place] appointment date: Date of COVID positive in last 53 days:  Bowel Prep reminder:   For Anesthesia: PCP - Dr. Cathlean Cower Cardiologist - Dr. Roderic Palau. LOV: 04/17/21  Chest x-ray - 09/04/21 EKG - 09/02/21 Stress Test -  ECHO - 04/03/20 Cardiac Cath -  Pacemaker/ICD device last checked: Pacemaker orders received: Device Rep notified:  Spinal Cord Stimulator:  Sleep Study -  CPAP -   Fasting Blood Sugar -  Checks Blood Sugar _____ times a day Date and result of last Hgb A1c-  Blood Thinner Instructions: Eliquis Aspirin Instructions: Last Dose:  Activity level: Can go up a flight of stairs and activities of daily living without stopping and without chest pain and/or shortness of breath   Able to exercise without chest pain and/or shortness of breath   Unable to go up a flight of stairs without chest pain and/or shortness of breath     Anesthesia review: Hx: A flutter,Heart murmur,HTN  Patient denies shortness of breath, fever, cough and chest pain at PAT appointment   Patient verbalized understanding of instructions that were given to them at the PAT appointment. Patient was also instructed that they will need to review over the PAT instructions again at home before surgery.

## 2021-09-25 NOTE — Telephone Encounter (Signed)
Surgical clearance forms from [facility] have been placed in Dr. Gwynn Burly box up front.

## 2021-09-26 ENCOUNTER — Other Ambulatory Visit (HOSPITAL_COMMUNITY): Payer: Self-pay

## 2021-09-26 ENCOUNTER — Telehealth: Payer: Self-pay | Admitting: Gastroenterology

## 2021-09-26 NOTE — Telephone Encounter (Signed)
Good Morning Dr. Lyndel Safe,  Supervising MD for 8/17 AM   Patient called wanting to schedule a colonoscopy with our practice. Patient advised me that her last procedure was in 2018 at [facility] with Dr. Benson Norway. I have printed off patients colonoscopy and pathology report from her procedure. I will be sending records for you to review, will you please review and advise on scheduling?   Thank you.

## 2021-09-26 NOTE — Telephone Encounter (Signed)
Forms received and given to Springer.

## 2021-09-30 ENCOUNTER — Encounter (HOSPITAL_COMMUNITY)
Admission: RE | Disposition: A | Discharge status: Home / Self Care | Payer: Self-pay | Source: Ambulatory Visit | Attending: Urology

## 2021-09-30 ENCOUNTER — Ambulatory Visit (HOSPITAL_COMMUNITY): Payer: 59 | Admitting: Physician Assistant

## 2021-09-30 ENCOUNTER — Ambulatory Visit (HOSPITAL_COMMUNITY): Payer: 59

## 2021-09-30 ENCOUNTER — Encounter (HOSPITAL_COMMUNITY): Payer: Self-pay | Admitting: Urology

## 2021-09-30 ENCOUNTER — Other Ambulatory Visit: Payer: Self-pay

## 2021-09-30 ENCOUNTER — Ambulatory Visit (HOSPITAL_BASED_OUTPATIENT_CLINIC_OR_DEPARTMENT_OTHER): Payer: 59 | Admitting: Anesthesiology

## 2021-09-30 ENCOUNTER — Ambulatory Visit (HOSPITAL_COMMUNITY)
Admission: RE | Admit: 2021-09-30 | Discharge: 2021-09-30 | Disposition: A | Discharge status: Home / Self Care | Payer: 59 | Source: Ambulatory Visit | Attending: Urology | Admitting: Urology

## 2021-09-30 DIAGNOSIS — F418 Other specified anxiety disorders: Secondary | ICD-10-CM | POA: Diagnosis not present

## 2021-09-30 DIAGNOSIS — Z87891 Personal history of nicotine dependence: Secondary | ICD-10-CM

## 2021-09-30 DIAGNOSIS — N200 Calculus of kidney: Secondary | ICD-10-CM | POA: Insufficient documentation

## 2021-09-30 DIAGNOSIS — Z6841 Body Mass Index (BMI) 40.0 and over, adult: Secondary | ICD-10-CM | POA: Insufficient documentation

## 2021-09-30 DIAGNOSIS — J45909 Unspecified asthma, uncomplicated: Secondary | ICD-10-CM | POA: Insufficient documentation

## 2021-09-30 DIAGNOSIS — K219 Gastro-esophageal reflux disease without esophagitis: Secondary | ICD-10-CM | POA: Diagnosis not present

## 2021-09-30 DIAGNOSIS — M1990 Unspecified osteoarthritis, unspecified site: Secondary | ICD-10-CM | POA: Diagnosis not present

## 2021-09-30 DIAGNOSIS — I739 Peripheral vascular disease, unspecified: Secondary | ICD-10-CM | POA: Insufficient documentation

## 2021-09-30 DIAGNOSIS — Z86711 Personal history of pulmonary embolism: Secondary | ICD-10-CM | POA: Insufficient documentation

## 2021-09-30 DIAGNOSIS — I129 Hypertensive chronic kidney disease with stage 1 through stage 4 chronic kidney disease, or unspecified chronic kidney disease: Secondary | ICD-10-CM | POA: Insufficient documentation

## 2021-09-30 DIAGNOSIS — N201 Calculus of ureter: Secondary | ICD-10-CM

## 2021-09-30 DIAGNOSIS — Z79899 Other long term (current) drug therapy: Secondary | ICD-10-CM | POA: Diagnosis not present

## 2021-09-30 DIAGNOSIS — I4891 Unspecified atrial fibrillation: Secondary | ICD-10-CM | POA: Insufficient documentation

## 2021-09-30 DIAGNOSIS — N189 Chronic kidney disease, unspecified: Secondary | ICD-10-CM | POA: Insufficient documentation

## 2021-09-30 DIAGNOSIS — R69 Illness, unspecified: Secondary | ICD-10-CM | POA: Diagnosis not present

## 2021-09-30 HISTORY — PX: CYSTOSCOPY/URETEROSCOPY/HOLMIUM LASER/STENT PLACEMENT: SHX6546

## 2021-09-30 SURGERY — CYSTOSCOPY/URETEROSCOPY/HOLMIUM LASER/STENT PLACEMENT
Anesthesia: General | Laterality: Right

## 2021-09-30 MED ORDER — ONDANSETRON HCL 4 MG/2ML IJ SOLN
INTRAMUSCULAR | Status: DC | PRN
  Administered 2021-09-30: 4 mg via INTRAVENOUS

## 2021-09-30 MED ORDER — LIDOCAINE HCL (CARDIAC) PF 100 MG/5ML IV SOSY
PREFILLED_SYRINGE | INTRAVENOUS | Status: DC | PRN
  Administered 2021-09-30: 100 mg via INTRATRACHEAL

## 2021-09-30 MED ORDER — MIDAZOLAM HCL 2 MG/2ML IJ SOLN
INTRAMUSCULAR | Status: AC
  Filled 2021-09-30: qty 2

## 2021-09-30 MED ORDER — ORAL CARE MOUTH RINSE: 15.0000 mL | Freq: Once | OROMUCOSAL | Status: AC

## 2021-09-30 MED ORDER — CHLORHEXIDINE GLUCONATE 0.12 % MT SOLN
15.0000 mL | Freq: Once | OROMUCOSAL | Status: AC
  Administered 2021-09-30: 15 mL via OROMUCOSAL

## 2021-09-30 MED ORDER — DOCUSATE SODIUM 100 MG PO CAPS: 100.0000 mg | ORAL_CAPSULE | Freq: Every day | ORAL | 0 refills | Status: DC | PRN

## 2021-09-30 MED ORDER — PROPOFOL 10 MG/ML IV BOLUS
INTRAVENOUS | Status: AC
  Filled 2021-09-30: qty 20

## 2021-09-30 MED ORDER — LIDOCAINE 2% (20 MG/ML) 5 ML SYRINGE
INTRAMUSCULAR | Status: AC
  Filled 2021-09-30: qty 5

## 2021-09-30 MED ORDER — MEPERIDINE HCL 50 MG/ML IJ SOLN: 6.2500 mg | INTRAMUSCULAR | Status: DC | PRN

## 2021-09-30 MED ORDER — DEXAMETHASONE SODIUM PHOSPHATE 10 MG/ML IJ SOLN
INTRAMUSCULAR | Status: AC
  Filled 2021-09-30: qty 1

## 2021-09-30 MED ORDER — SODIUM CHLORIDE 0.9 % IR SOLN
Status: DC | PRN
  Administered 2021-09-30: 3000 mL

## 2021-09-30 MED ORDER — LACTATED RINGERS IV SOLN: INTRAVENOUS | Status: DC

## 2021-09-30 MED ORDER — SUCCINYLCHOLINE CHLORIDE 200 MG/10ML IV SOSY
PREFILLED_SYRINGE | INTRAVENOUS | Status: DC | PRN
  Administered 2021-09-30: 100 mg via INTRAVENOUS

## 2021-09-30 MED ORDER — MIDAZOLAM HCL 2 MG/2ML IJ SOLN
INTRAMUSCULAR | Status: DC | PRN
  Administered 2021-09-30: 2 mg via INTRAVENOUS

## 2021-09-30 MED ORDER — CEFAZOLIN IN SODIUM CHLORIDE 3-0.9 GM/100ML-% IV SOLN
3.0000 g | Freq: Once | INTRAVENOUS | Status: AC
  Administered 2021-09-30: 3 g via INTRAVENOUS
  Filled 2021-09-30: qty 100

## 2021-09-30 MED ORDER — IOHEXOL 300 MG/ML  SOLN
INTRAMUSCULAR | Status: DC | PRN
  Administered 2021-09-30: 10 mL

## 2021-09-30 MED ORDER — FENTANYL CITRATE (PF) 100 MCG/2ML IJ SOLN
INTRAMUSCULAR | Status: AC
  Filled 2021-09-30: qty 2

## 2021-09-30 MED ORDER — CEPHALEXIN 500 MG PO CAPS: 500.0000 mg | ORAL_CAPSULE | Freq: Two times a day (BID) | ORAL | 0 refills | Status: AC

## 2021-09-30 MED ORDER — PROPOFOL 10 MG/ML IV BOLUS
INTRAVENOUS | Status: DC | PRN
  Administered 2021-09-30: 170 mg via INTRAVENOUS

## 2021-09-30 MED ORDER — OXYCODONE-ACETAMINOPHEN 5-325 MG PO TABS: 1.0000 | ORAL_TABLET | ORAL | 0 refills | Status: DC | PRN

## 2021-09-30 MED ORDER — PROMETHAZINE HCL 25 MG/ML IJ SOLN: 6.2500 mg | INTRAMUSCULAR | Status: DC | PRN

## 2021-09-30 MED ORDER — IPRATROPIUM-ALBUTEROL 0.5-2.5 (3) MG/3ML IN SOLN
3.0000 mL | RESPIRATORY_TRACT | Status: AC
  Administered 2021-09-30: 3 mL via RESPIRATORY_TRACT
  Filled 2021-09-30 (×2): qty 3

## 2021-09-30 MED ORDER — FENTANYL CITRATE (PF) 250 MCG/5ML IJ SOLN
INTRAMUSCULAR | Status: AC
  Filled 2021-09-30: qty 5

## 2021-09-30 MED ORDER — PHENYLEPHRINE 80 MCG/ML (10ML) SYRINGE FOR IV PUSH (FOR BLOOD PRESSURE SUPPORT)
PREFILLED_SYRINGE | INTRAVENOUS | Status: DC | PRN
  Administered 2021-09-30 (×3): 80 ug via INTRAVENOUS

## 2021-09-30 MED ORDER — ONDANSETRON HCL 4 MG/2ML IJ SOLN
INTRAMUSCULAR | Status: AC
  Filled 2021-09-30: qty 2

## 2021-09-30 MED ORDER — FENTANYL CITRATE (PF) 100 MCG/2ML IJ SOLN
INTRAMUSCULAR | Status: DC | PRN
  Administered 2021-09-30 (×4): 50 ug via INTRAVENOUS

## 2021-09-30 MED ORDER — FENTANYL CITRATE PF 50 MCG/ML IJ SOSY: 25.0000 ug | PREFILLED_SYRINGE | INTRAMUSCULAR | Status: DC | PRN

## 2021-09-30 MED ORDER — PHENYLEPHRINE HCL (PRESSORS) 10 MG/ML IV SOLN: INTRAVENOUS | Status: DC | PRN

## 2021-09-30 MED ORDER — DEXAMETHASONE SODIUM PHOSPHATE 10 MG/ML IJ SOLN
INTRAMUSCULAR | Status: DC | PRN
  Administered 2021-09-30: 10 mg via INTRAVENOUS

## 2021-09-30 SURGICAL SUPPLY — 25 items
APL SKNCLS STERI-STRIP NONHPOA (GAUZE/BANDAGES/DRESSINGS) ×1
BAG URO CATCHER STRL LF (MISCELLANEOUS) ×2 IMPLANT
BASKET ZERO TIP NITINOL 2.4FR (BASKET) IMPLANT
BENZOIN TINCTURE PRP APPL 2/3 (GAUZE/BANDAGES/DRESSINGS) IMPLANT
BSKT STON RTRVL ZERO TP 2.4FR (BASKET)
CATH URETL OPEN 5X70 (CATHETERS) ×2 IMPLANT
CLOTH BEACON ORANGE TIMEOUT ST (SAFETY) ×2 IMPLANT
DRSG TEGADERM 2-3/8X2-3/4 SM (GAUZE/BANDAGES/DRESSINGS) IMPLANT
FIBER LASER MOSES 200 DFL (Laser) IMPLANT
GLOVE BIOGEL M 7.0 STRL (GLOVE) ×2 IMPLANT
GOWN STRL REUS W/ TWL XL LVL3 (GOWN DISPOSABLE) ×2 IMPLANT
GOWN STRL REUS W/TWL XL LVL3 (GOWN DISPOSABLE) ×1
GUIDEWIRE STR DUAL SENSOR (WIRE) ×4 IMPLANT
GUIDEWIRE ZIPWRE .038 STRAIGHT (WIRE) IMPLANT
KIT TURNOVER KIT A (KITS) IMPLANT
LASER FIB FLEXIVA PULSE ID 365 (Laser) IMPLANT
MANIFOLD NEPTUNE II (INSTRUMENTS) ×2 IMPLANT
PACK CYSTO (CUSTOM PROCEDURE TRAY) ×2 IMPLANT
SHEATH NAV HD 11/13X46 (SHEATH) IMPLANT
SHEATH URETERAL 12FRX35CM (MISCELLANEOUS) IMPLANT
STENT URET 6FRX24 CONTOUR (STENTS) IMPLANT
TRACTIP FLEXIVA PULS ID 200XHI (Laser) IMPLANT
TRACTIP FLEXIVA PULSE ID 200 (Laser)
TUBING CONNECTING 10 (TUBING) ×2 IMPLANT
TUBING UROLOGY SET (TUBING) ×2 IMPLANT

## 2021-09-30 NOTE — Transfer of Care (Signed)
Immediate Anesthesia Transfer of Care Note  Patient: [name]  Procedure(s) Performed: CYSTOSCOPY/ RETROGRADE/URETEROSCOPY/HOLMIUM LASER/STENT PLACEMENT (Right)  Patient Location: PACU  Anesthesia Type:General  Level of Consciousness: awake and alert   Airway & Oxygen Therapy: Patient Spontanous Breathing and Patient connected to face mask oxygen  Post-op Assessment: Report given to RN and Post -op Vital signs reviewed and stable  Post vital signs: Reviewed and stable  Last Vitals:  Vitals Value Taken Time  BP 136/83 09/30/21 1327  Temp    Pulse 99 09/30/21 1328  Resp 17 09/30/21 1328  SpO2 99 % 09/30/21 1328  Vitals shown include unvalidated device data.  Last Pain:  Vitals:   09/30/21 1040  TempSrc:   PainSc: 0-No pain         Complications: No notable events documented.

## 2021-09-30 NOTE — Anesthesia Preprocedure Evaluation (Addendum)
Anesthesia Evaluation  Patient identified by MRN, date of birth, ID band Patient awake    Reviewed: Allergy & Precautions, H&P , NPO status , Patient's Chart, lab work & pertinent test results, reviewed documented beta blocker date and time   Airway Mallampati: II  TM Distance: >3 FB Neck ROM: Limited    Dental  (+) Dental Advisory Given   Pulmonary shortness of breath, asthma , pneumonia, former smoker, PE Vocal cord disorder    + decreased breath sounds+ wheezing      Cardiovascular hypertension, Pt. on medications and Pt. on home beta blockers + Peripheral Vascular Disease  + dysrhythmias Atrial Fibrillation + Valvular Problems/Murmurs  Rhythm:Regular Rate:Normal  Palpitations Echo 03/2021 1. Left ventricular ejection fraction, by estimation, is 50 to 55%. The left ventricle has low normal function. The left ventricle has no regional wall motion abnormalities. There is mild concentric left ventricular hypertrophy. Left ventricular diastolic parameters are indeterminate.  2. Right ventricular systolic function is normal. The right ventricular size is normal. Tricuspid regurgitation signal is inadequate for assessing PA pressure.  3. Left atrial size was mildly dilated.  4. Right atrial size was mildly dilated.  5. A small pericardial effusion is present. The pericardial effusion is circumferential.  6. The mitral valve is normal in structure. No evidence of mitral valve regurgitation. No evidence of mitral stenosis.  7. The aortic valve is grossly normal. Aortic valve regurgitation is not visualized. No aortic stenosis is present.  8. The inferior vena cava is normal in size with <50% respiratory variability, suggesting right atrial pressure of 8 mmHg.    Neuro/Psych PSYCHIATRIC DISORDERS Anxiety Depression negative neurological ROS     GI/Hepatic GERD  Poorly Controlled,(+)     substance abuse  alcohol use,   Endo/Other   negative endocrine ROSMorbid obesitygoiter  Renal/GU ARF and CRFRenal disease     Musculoskeletal  (+) Arthritis ,   Abdominal (+) + obese,   Peds  Hematology  (+) Blood dyscrasia, anemia ,   Anesthesia Other Findings   Reproductive/Obstetrics negative OB ROS                          Anesthesia Physical  Anesthesia Plan  ASA: 4  Anesthesia Plan: General   Post-op Pain Management: Ofirmev IV (intra-op)* and Minimal or no pain anticipated   Induction: Intravenous  PONV Risk Score and Plan: 3 and Ondansetron, Dexamethasone, Midazolam and Treatment may vary due to age or medical condition  Airway Management Planned: Oral ETT  Additional Equipment: None  Intra-op Plan:   Post-operative Plan: Extubation in OR  Informed Consent: I have reviewed the patients History and Physical, chart, labs and discussed the procedure including the risks, benefits and alternatives for the proposed anesthesia with the patient or authorized representative who has indicated his/her understanding and acceptance.     Dental advisory given  Plan Discussed with: CRNA  Anesthesia Plan Comments:       Anesthesia Quick Evaluation

## 2021-09-30 NOTE — Op Note (Signed)
Operative Note  Preoperative diagnosis:  1.  Right ureteral stone  Postoperative diagnosis: 1.  Right renal stones  Procedure(s): 1.  Cystoscopy 2. Right ureteroscopy with laser lithotripsy and basket extraction of stones 3. Right retrograde pyelogram 4. Right ureteral stent exchange 5. Fluoroscopy with intraoperative interpretation  Surgeon: Rexene Alberts, MD  Assistants:  None  Anesthesia:  General  Complications:  None  EBL:  Minimal  Specimens: 1. Renal stones for stone analysis (to be done at [facility])  Drains/Catheters: 1.  Right 6Fr x 24cm ureteral stent with tether string  Intraoperative findings:   Cystoscopy demonstrated a 2x2cm area of erythema, edema on the posterior aspect of the bladder. No evidence of patent fistula, however likely location of chronic colovesicular/diverticular abcess location that previously underwent drainage in 2019. No evidence of fecal material in bladder. No suspicion of bladder cancer. Right ureteroscopy demonstrated no evidence of ureteral stone. There was a 18m right lower pole stone that was repositioned in the upper pole, fragmented and basket extraction. She also had smaller punctate stones that were basket extracted. No significant large stone fragments at the end of the case. Successful right ureteral stent exchange  Indication:  Susan MAZZAFERROis a 61y.o. female with a 633mright ureteral stone who presents today for definitive stone treatment.  Description of procedure: After informed consent was obtained from the patient, the patient was identified and taken to the operating room and placed in the supine position.  General anesthesia was administered as well as perioperative IV antibiotics.  At the beginning of the case, a time-out was performed to properly identify the patient, the surgery to be performed, and the surgical site.  Sequential compression devices were applied to the lower extremities at the beginning of the  case for DVT prophylaxis.  The patient was then placed in the dorsal lithotomy supine position, prepped and draped in sterile fashion.  We then passed the 21-French rigid cystoscope through the urethra and into the bladder under vision without any difficulty, noting a normal urethra without strictures.  She did have an area of increased erythema and edema on the posterior aspect of the bladder with no evidence of patent fistula. The bladder was then systematically examined in its entirety. There was no evidence or suspicion of cancer.  The distal aspect of the ureteral stent was seen protruding from the right ureteral orifice.  We then used the alligator-tooth forceps and grasped the distal end of the ureteral stent and brought it out the urethral meatus while watching the proximal coil straighten out nicely on fluoroscopy. Through the ureteral stent, we then passed a 0.038 sensor wire up to the level of the renal pelvis.  The ureteral stent was then removed, leaving the sensor wire up the right ureter.    Under cystoscopic and flouroscopic guidance, we cannulated the right ureteral orifice with a 5-French open-ended ureteral catheter and a gentle retrograde pyelogram was performed, revealing a normal caliber ureter without any filling defects. There was no hydronephrosis of the collecting system. A 0.038 sensor wire was then passed up to the level of the renal pelvis and secured to the drape as a safety wire. The ureteral catheter and cystoscope were removed, leaving the safety wire in place.   A semi-rigid ureteroscope was passed alongside the wire up the distal ureter which appeared normal. A second 0.038 sensor wire was passed under direct vision and the semirigid scope was removed. A 11/13Fr ureteral access sheath was carefully advanced up  the ureter to the level of the UPJ over this wire under fluoroscopic guidance. The flexible ureteroscope was advanced into the collecting system via the access  sheath. The collecting system was inspected. The calculus was identified at the right lower pole. This was repositioned in the right upper pole with a basket. Using the 200 micron holmium laser fiber, the stone was fragmented completely into 2 fragments. A 2.2 Fr zero tip basket was used to remove the fragments under visual guidance. I also removed her punctate right lower pole stones. These were sent for chemical analysis. With the ureteroscope in the kidney, a gentle pyelogram was performed to delineate the calyceal system and we evaluated the calyces systematically. We encountered no further large stone fragments. The rest of the stone fragments were very tiny and these were  irrigated away gently. The calyces were re-inspected and there were no significant stone fragment residual.   We then withdrew the ureteroscope back down the ureter along with the access sheath, noting no evidence of any stones along the course of the ureter.  Prior to removing the ureteroscope, we did pass the Glidewire back up to the ureter to the renal pelvis.  Once the ureteroscope was removed, we then used the Glidewire under fluoroscopic guidance and passed up a 6-French x 24 cm double-pigtail ureteral stent up the ureter, making sure that the proximal and distal ends coiled within the kidney and bladder respectively.  Note that we left a long tether string attached to the distal end of the ureteral stent and it exited the urethral meatus and was secured to the inner thigh with a tegaderm adhesive.  The cystoscope was then advanced back into the bladder under vision.  We were able to see the distal stent coiling nicely within the bladder.  The bladder was then emptied with irrigation solution.  The cystoscope was then removed.    The patient tolerated the procedure well and there was no complication. Patient was awoken from anesthesia and taken to the recovery room in stable condition. I was present and scrubbed for the entirety  of the case.  Plan:  Patient will be discharged home.  Followup in one month with renal ultrasound prior.  [facility]  Pager: [phone]

## 2021-09-30 NOTE — H&P (Addendum)
Office Visit Report     09/25/2021    CC/HPI: [name] is a 61 year old female seen in follow-up with history of a right ureteral stone.   #1. Nephrolithiasis:  -She presented to ED in 08/2021 and alcohol withdrawal. She had severe sepsis with tachycardia. She was in renal failure with creatinine 8.6 from baseline 1.0. CT A/P 09/05/2021 revealed 6 mm stone in the proximal right ureter with mild right hydroureteronephrosis. She also had punctate nonobstructing bilateral stones measuring 2 to 3 mm. She underwent right ureteral stent placement on 09/05/2021. She was bacteremic with Enterobacter [place]. Urine culture with E. coli. She has completed course of antibiotics. She denies fevers, chills, dysuria.   #2. Bladder Wall - Diverticular Abscess - dome intramural bladder fluid collection with connection to sigmoid / diverticulosis tract by ER CT 07/2016 on eval dysuria and pelvic pain most c/w diverticular abscess. Deneis h/o colon CA / pelvic radiation. Had normal colonoscopy 07/2016. Managed with IR drain 07/2016, but she failed to follow up. Repeat CT 09/2016 with likely resolving area of prior inflammation. Cysto 09/2016 without fistula and UA completely normal. CT 03/2017 with continued improvement and likely just residual scarring along prior fistula tract. CT A/P 09/05/2021 with unchanged appearance of chronic colovaginal/colovesical fistula. Cystoscopy 09/05/2021 with residual scarring along prior fistula tract.  -She denies fecal urea or pneumaturia.   PMH sig for morbid obesity, diverticulosis, vesicovaginal fistula closure alcohol abuse,     ALLERGIES: Aleve - Nausea Latex Playtex - Skin Rash Shrimp - Itching Tramadol - Other Reaction, confusion    MEDICATIONS: Metoprolol Succinate  Albuterol Sulfate  Atorvastatin Calcium 20 mg tablet  Eliquis 5 mg tablet  Folic Acid  Losartan-Hydrochlorothiazide 100 mg-25 mg tablet  Robaxin     GU PSH: Cystoscopy - 2019, 2018 Locm 300-'399Mg'$ /Ml  Iodine,1Ml - 2019     NON-GU PSH: Knee replacement, Bilateral - 2007     GU PMH: Gross hematuria - 2019 Dysuria (Worsening, Chronic), Culture urine. No ABX unless culture proven UTI. Urogesic-blue 81.6 mg 1 po TID PRN. F/U with Dr. Tresa Moore for 6 month cysto. If cysto shows no recurrence of fistula can than f/u PRN - 2019 Chronic cystitis (w/o hematuria) (Stable, Chronic) - 2018 Urinary incontinence, Unspec, Urinary incontinence - 2014      PMH Notes: .   NON-GU PMH: Fistula of intestine (Improving, Chronic) - 2018 Arrhythmia Arthritis Asthma Atrial Fibrillation GERD Hypercholesterolemia Hypertension Pulmonary Embolism, History    FAMILY HISTORY: 1 son - Son Asthma - Runs in Family copd - Runs in Family Death In The Family Father - Father Death In The Family Mother - Mother Diabetes - Runs in Family stroke - Runs in Family   SOCIAL HISTORY: Marital Status: Married Preferred Language: English; Ethnicity: Not Hispanic Or Latino; Race: Black or African American Current Smoking Status: Patient does not smoke anymore. Has not smoked since 02/10/1997.   Tobacco Use Assessment Completed: Used Tobacco in last 30 days? Does not use smokeless tobacco. Does not drink anymore.  Does not use drugs. Drinks 1 caffeinated drink per day. 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    REVIEW OF SYSTEMS:    GU Review Female:   Patient reports frequent urination, burning /pain with urination, and get up at night to urinate. Patient denies hard to postpone urination, leakage of urine, stream starts and stops, trouble starting your stream, have to strain to urinate, and being pregnant.  Gastrointestinal (Upper):   Patient reports nausea and indigestion/ heartburn. Patient denies vomiting.  Gastrointestinal (Lower):   Patient reports constipation. Patient denies diarrhea.  Constitutional:   Patient reports night sweats. Patient denies fever, weight loss, and fatigue.  Skin:   Patient denies  skin rash/ lesion and itching.  Eyes:   Patient denies blurred vision and double vision.  Ears/ Nose/ Throat:   Patient denies sore throat and sinus problems.  Hematologic/Lymphatic:   Patient denies swollen glands and easy bruising.  Cardiovascular:   Patient denies leg swelling and chest pains.  Respiratory:   Patient denies cough and shortness of breath.  Endocrine:   Patient denies excessive thirst.  Musculoskeletal:   Patient reports joint pain. Patient denies back pain.  Neurological:   Patient denies headaches and dizziness.  Psychologic:   Patient reports anxiety. Patient denies depression.   VITAL SIGNS:      09/25/2021 11:18 AM  Height 61 in / 154.94 cm  BP 129/85 mmHg  Pulse 88 /min  Temperature 97.1 F / 36.1 C   MULTI-SYSTEM PHYSICAL EXAMINATION:    Constitutional: Well-nourished. No physical deformities. Normally developed. Good grooming.  Respiratory: No labored breathing, no use of accessory muscles.   Cardiovascular: Normal temperature, normal extremity pulses, no swelling, no varicosities.  Gastrointestinal: No mass, no tenderness, no rigidity, non obese abdomen.     Complexity of Data:  Source Of History:  Patient, Healthcare Provider, Medical Record Summary  Records Review:   Previous Doctor Records, Previous Hospital Records, Previous Patient Records  Urine Test Review:   Urinalysis  X-Ray Review: C.T. Abdomen/Pelvis: Reviewed Films. Reviewed Report. Discussed With Patient.     PROCEDURES:          Urinalysis w/Scope Dipstick Dipstick Cont'd Micro  Color: Amber Bilirubin: Neg mg/dL WBC/hpf: Packed/hpf  Appearance: Slightly Cloudy Ketones: Neg mg/dL RBC/hpf: Packed/hpf  Specific Gravity: 1.025 Blood: 3+ ery/uL Bacteria: Mod (26-50/hpf)  pH: 6.0 Protein: 1+ mg/dL Cystals: NS (Not Seen)  Glucose: Neg mg/dL Urobilinogen: 0.2 mg/dL Casts: NS (Not Seen)    Nitrites: Neg Trichomonas: Not Present    Leukocyte Esterase: 3+ leu/uL Mucous: Not Present       Epithelial Cells: NS (Not Seen)      Yeast: NS (Not Seen)      Sperm: Not Present    ASSESSMENT:      ICD-10 Details  1 GU:   Ureteral calculus - N20.1    PLAN:            Medications New Meds: Percocet 5 mg-325 mg tablet 1 tablet PO Q 4 H PRN   #5  0 Refill(s)  Pharmacy Name:  [id number] Address:  [address]  [place] [address] Phone:  [phone] Fax:  [phone]           Document Letter(s):  Created for Patient: Clinical Summary         Notes:   #1. Ureteral stone:  -She is scheduled for a cystoscopy, ureteroscopy with laser lithotripsy and basket traction of right ureteral stone. This is scheduled for next week. She has completed course of antibiotics. She is without sign of infection today. Discussed risk as below.   We discussed the options for management of kidney stones, including observation, ESWL, ureteroscopy with laser lithotripsy, and PCNL. The risks and benefits of each option were discussed.  For observation I described the risks which include but are not limited to silent renal damage, life-threatening infection, need for emergent surgery, failure to pass stone, and pain.  ESWL: risks and benefits of ESWL were outlined including infection, bleeding, pain, steinstrasse, kidney injury, need for ancillary treatments, and global anesthesia risks including but not limited to CVA, MI, DVT, PE, pneumonia, and death.   Ureteroscopy: risks and benefits of ureteroscopy were outlined, including infection, bleeding, pain, temporary ureteral stent and associated stent bother, ureteral injury, ureteral stricture, need for ancillary treatments, and global anesthesia risks including but not limited to CVA, MI, DVT, PE, pneumonia, and death.   PCNL: risks and benefits of PCNL were outlined including infection, bleeding, blood transfusion, pain, pneumothorax, bowel injury, persistent urine leak, positioning injury, inability to clear stone  burden, renal laceration, arterial venous fistula or malformation, need for ancillary treatments, and global anesthesia risks including but not limited to CVA, MI, DVT, PE, pneumonia, and death.    We discussed dietary methods for stone prevention including the following: increased water intake to 2-3 liters per day, add lemon or lemon concentrate to water to increase citrate which is beneficial for stone prevention, limiting dietary sodium to less than 2000 mg per day, limiting animal protein to less than 2 servings (16 ounces/day), and limiting foods high in oxalate content (spinach, beans, chocolate, etc.).   #2. Bladder wall/diverticular abscess: She has residual scarring related to fistula in 2018. Well-healed fistula by cystoscopy. She denies voiding symptoms.   CC: Cathlean Cower, MD        Next Appointment:      Next Appointment: 09/30/2021 12:00 PM    Appointment Type: Surgery     Location: [facility] [phone]    Provider: Rexene Alberts, M.D.    Reason for Visit: [facility]/OP CYSTO RT RPG, RT URS LL, RT STENT EXCHANGE    Urology Preoperative H&P   Chief Complaint: right ureteral stone  History of Present Illness: DEEANDRA JERRY is a 61 y.o. female with a right ureteral stone here for right ureteroscopy with laser lithotripsy and basket extraction of stone. Denies fevers, chills, dysuria. UA last week without sign of infection.    Past Medical History:  Diagnosis Date   Abscess of bladder 07/28/2016   Alcohol dependence ([place])    Allergic rhinitis 12/06/2013   Anxiety    Asthma 03/16/2015   Atrial flutter with rapid ventricular response ([place]) 04/01/2020   COLONIC POLYPS, HX OF 04/05/2010   DIVERTICULITIS, HX OF 04/05/2010   DJD (degenerative joint disease)    right knee, mot to severe   Dyspnea    Dysrhythmia    GERD (gastroesophageal reflux disease)    no meds   Heart murmur    hx of    History of kidney stones    Hyperlipidemia    Hypertension     Impaired glucose tolerance 12/06/2013   Morbid obesity with BMI of 50.0-59.9, adult ([place])    Peripheral vascular disease ([place])    Pneumonia    Pulmonary embolism ([place])     Past Surgical History:  Procedure Laterality Date   ABDOMINAL HYSTERECTOMY  age 29   fibroids   COLONOSCOPY WITH PROPOFOL N/A 07/25/2016   Procedure: COLONOSCOPY WITH PROPOFOL;  Surgeon: Carol Ada, MD;  Location: [facility];  Service: Endoscopy;  Laterality: N/A;   colonscopy     x 2   CYSTOSCOPY W/ URETERAL STENT PLACEMENT Right 09/05/2021   Procedure: CYSTOSCOPY WITH RETROGRADE PYELOGRAM/URETERAL STENT PLACEMENT;  Surgeon: Janith Lima, MD;  Location: [facility];  Service: Urology;  Laterality: Right;   IR RADIOLOGIST EVAL & MGMT  08/12/2016   IR  RADIOLOGIST EVAL & MGMT  08/21/2016   KNEE ARTHROSCOPY     left    TOTAL KNEE ARTHROPLASTY  07/29/2011   Procedure: TOTAL KNEE ARTHROPLASTY;  Surgeon: Mauri Pole, MD;  Location: [facility];  Service: Orthopedics;  Laterality: Right;   TOTAL KNEE ARTHROPLASTY Left 12/14/2012   Procedure: LEFT TOTAL KNEE ARTHROPLASTY;  Surgeon: Mauri Pole, MD;  Location: [facility];  Service: Orthopedics;  Laterality: Left;    Allergies:  Allergies  Allergen Reactions   Morphine Itching   Shrimp [Shellfish Allergy] Itching and Other (See Comments)    Tongue burns also   Tramadol Other (See Comments)    Caused confusion   Covid-19 (Mrna) Vaccine Hives   Diltiazem Hcl Itching    Pt with itching of the feet when bolus given   Other Other (See Comments)   Latex Rash    Family History  Problem Relation Age of Onset   Stroke Mother    COPD Father    Lymphoma Sister    Alcoholism Sister     Social History:  reports that she quit smoking about 33 years ago. Her smoking use included cigarettes. She has a 3.50 pack-year smoking history. She has never used smokeless tobacco. She reports that she does not currently use alcohol. She reports that she does not currently use drugs after  having used the following drugs: Marijuana.  ROS: A complete review of systems was performed.  All systems are negative except for pertinent findings as noted.  Physical Exam:  Vital signs in last 24 hours: Temp:  [97.8 F (36.6 C)] 97.8 F (36.6 C) (08/21 1015) Pulse Rate:  [87] 87 (08/21 1015) Resp:  [15] 15 (08/21 1015) BP: (143)/(81) 143/81 (08/21 1015) SpO2:  [100 %] 100 % (08/21 1015) Weight:  [126.6 kg] 126.6 kg (08/21 1040) Constitutional:  Alert and oriented, No acute distress Cardiovascular: Regular rate and rhythm Respiratory: Normal respiratory effort, Lungs clear bilaterally GI: Abdomen is soft, nontender, nondistended, no abdominal masses GU: No CVA tenderness Lymphatic: No lymphadenopathy Neurologic: Grossly intact, no focal deficits Psychiatric: Normal mood and affect  Laboratory Data:  No results for input(s): "WBC", "HGB", "HCT", "PLT" in the last 72 hours.  No results for input(s): "NA", "K", "CL", "GLUCOSE", "BUN", "CALCIUM", "CREATININE" in the last 72 hours.  Invalid input(s): "CO3"   No results found for this or any previous visit (from the past 24 hour(s)). No results found for this or any previous visit (from the past 240 hour(s)).  Renal Function: Recent Labs    09/25/21 0953  CREATININE 1.31*   Estimated Creatinine Clearance: 57.2 mL/min (A) (by C-G formula based on SCr of 1.31 mg/dL (H)).  Radiologic Imaging: No results found.  I independently reviewed the above imaging studies.  Assessment and Plan ANNIECE BLEILER is a 61 y.o. female with right ureteral stone here for right ureteroscopy with laser lithotripsy and basket extraction of stone.   -The risks, benefits and alternatives of cystoscopy with right ureteroscopy with laser lithotripsy and basket extraction of stone, right JJ stent placement was discussed with the patient.  Risks include, but are not limited to: bleeding, urinary tract infection, ureteral injury, ureteral stricture  disease, chronic pain, urinary symptoms, bladder injury, stent migration, the need for nephrostomy tube placement, MI, CVA, DVT, PE and the inherent risks with general anesthesia.  The patient voices understanding and wishes to proceed.    Matt R. Samie Barclift MD 09/30/2021, 11:02 AM  [facility] Pager: [phone]): [phone]

## 2021-09-30 NOTE — Anesthesia Procedure Notes (Signed)
Procedure Name: Intubation Date/Time: 09/30/2021 12:22 PM  Performed by: Sharlette Dense, CRNAPatient Re-evaluated:Patient Re-evaluated prior to induction Oxygen Delivery Method: Circle system utilized Preoxygenation: Pre-oxygenation with 100% oxygen Induction Type: IV induction and Rapid sequence Laryngoscope Size: Miller and 3 Grade View: Grade I Tube type: Oral Tube size: 7.5 mm Number of attempts: 2 Airway Equipment and Method: Stylet Placement Confirmation: ETT inserted through vocal cords under direct vision, positive ETCO2 and breath sounds checked- equal and bilateral Secured at: 22 cm Tube secured with: Tape Dental Injury: Teeth and Oropharynx as per pre-operative assessment  Comments: 2nd attempt due to large tongue.  Once pass tongue, grade 1 view

## 2021-09-30 NOTE — OR Nursing (Signed)
Stone taken by Dr. Gay. ?

## 2021-09-30 NOTE — Discharge Instructions (Addendum)
[facility] [phone] Post Ureteroscopy With or Without Stent Instructions  Definitions:  Ureter: The duct that transports urine from the kidney to the bladder. Stent:   A plastic hollow tube that is placed into the ureter, from the kidney to the bladder to prevent the ureter from swelling shut.  GENERAL INSTRUCTIONS:  Despite the fact that no skin incisions were used, the area around the ureter and bladder is raw and irritated. The stent is a foreign body which will further irritate the bladder wall. This irritation is manifested by increased frequency of urination, both day and night, and by an increase in the urge to urinate. In some, the urge to urinate is present almost always. Sometimes the urge is strong enough that you may not be able to stop yourself from urinating. The only real cure is to remove the stent and then give time for the bladder wall to heal which can't be done until the danger of the ureter swelling shut has passed, which varies.  You may see some blood in your urine while the stent is in place and a few days afterwards. Do not be alarmed, even if the urine was clear for a while. Get off your feet and drink lots of fluids until clearing occurs. If you start to pass clots or don't improve, call us.  DIET: You may return to your normal diet immediately. Because of the raw surface of your bladder, alcohol, spicy foods, acid type foods and drinks with caffeine may cause irritation or frequency and should be used in moderation. To keep your urine flowing freely and to avoid constipation, drink plenty of fluids during the day ( 8-10 glasses ). Tip: Avoid cranberry juice because it is very acidic.  ACTIVITY: Your physical activity doesn't need to be restricted. However, if you are very active, you may see some blood in your urine. We suggest that you reduce your activity under these circumstances until the bleeding has stopped.  BOWELS: It is important to  keep your bowels regular during the postoperative period. Straining with bowel movements can cause bleeding. A bowel movement every other day is reasonable. Use a mild laxative if needed, such as Milk of Magnesia 2-3 tablespoons, or 2 Dulcolax tablets. Call if you continue to have problems. If you have been taking narcotics for pain, before, during or after your surgery, you may be constipated. Take a laxative if necessary.   MEDICATION: You should resume your pre-surgery medications unless told not to. In addition you will often be given an antibiotic to prevent infection. These should be taken as prescribed until the bottles are finished unless you are having an unusual reaction to one of the drugs.  PROBLEMS YOU SHOULD REPORT TO [place]: Fevers over 100.5 Fahrenheit. Heavy bleeding, or clots ( See above notes about blood in urine ). Inability to urinate. Drug reactions ( hives, rash, nausea, vomiting, diarrhea ). Severe burning or pain with urination that is not improving.  FOLLOW-UP: You will need a follow-up appointment to monitor your progress. Call for this appointment at the number listed above. Usually the first appointment will be about three to fourteen days after your surgery.  You have a right ureteral stent in place. Remove the stent by pulling on attached string on Thursday AM.

## 2021-10-01 ENCOUNTER — Encounter (HOSPITAL_COMMUNITY): Payer: Self-pay | Admitting: Urology

## 2021-10-01 NOTE — Anesthesia Postprocedure Evaluation (Signed)
Anesthesia Post Note  Patient: [name]  Procedure(s) Performed: CYSTOSCOPY/ RETROGRADE/URETEROSCOPY/HOLMIUM LASER/STENT PLACEMENT (Right)     Patient location during evaluation: PACU Anesthesia Type: General Level of consciousness: sedated and patient cooperative Pain management: pain level controlled Vital Signs Assessment: post-procedure vital signs reviewed and stable Respiratory status: spontaneous breathing Cardiovascular status: stable Anesthetic complications: no   No notable events documented.  Last Vitals:  Vitals:   09/30/21 1400 09/30/21 1424  BP: (!) 148/84 (!) 151/78  Pulse: 98   Resp: 15   Temp: 36.7 C   SpO2: 95% 94%    Last Pain:  Vitals:   09/30/21 1424  TempSrc:   PainSc: 0-No pain                 Nolon Nations

## 2021-10-02 ENCOUNTER — Other Ambulatory Visit: Payer: Self-pay | Admitting: Internal Medicine

## 2021-10-02 NOTE — Telephone Encounter (Signed)
Please refill as per office routine med refill policy (all routine meds to be refilled for 3 mo or monthly (per pt preference) up to one year from last visit, then month to month grace period for 3 mo, then further med refills will have to be denied) ? ?

## 2021-10-05 ENCOUNTER — Other Ambulatory Visit: Payer: Self-pay | Admitting: Internal Medicine

## 2021-10-07 ENCOUNTER — Ambulatory Visit: Payer: Self-pay

## 2021-10-07 NOTE — Progress Notes (Signed)
[facility])  [place] Team    10/07/2021  [name] 27-Jul-1960 [id number]  Reason for referral: Medication Assistance with generics losartan/HCTZ and albuterol  Referral source: [facility] RN Current insurance: [employer] - commercial  I called patient to f/u to see if  she was able to get albuterol refilled using GoodRx coupon and was unable to reach. Additionally, I do not see that patient contacted PCP re: use of losartan/HCTZ or clinic outreach re: whether pt should resume losartan/HCTZ.    Plan: Will close [facility] pharmacy case as no further medication needs identified at this time. Provider notified of losartan/HCTZ being held on prior discharge. I am happy to assist in the future as needed.    Thank you for allowing pharmacy to be a part of this patient's care. Kristeen Miss, PharmD Clinical Pharmacist [place] Cell: [phone]

## 2021-10-07 NOTE — Patient Outreach (Signed)
  Care Coordination   Follow Up Visit Note   10/07/2021 Name: [name] MRN: [id number] DOB: [birth year]  [name] is a 61 y.o. year old female who sees Biagio Borg, MD for primary care. I spoke with  [name] by phone today.  What matters to the patients health and wellness today?  Reports has lost weight. She reports she was 303 lb when discharge 09/30/21 and reports she is down to 279 lb. She reports she thinks it is due to she has stopped drinking. She states "I am doing great with not drinking". She also had questions regarding disability denial from social security. She is receptive to social work referral. Reports she has all her medications and denies any medication concerns.   Goals Addressed             This Visit's Progress    Patient Stated: loose weight       Care Coordination Interventions: Advised patient to discuss questions/concerns  with provider Encouraged increase exercise as recommended by providers Instructed on how to obtain the most reliable weights Encouraged to continue to eat healthy Social work referral regarding support for alcohol abstinence and social work concerns as needed           SDOH assessments and interventions completed:  No   Care Coordination Interventions Activated:  Yes  Care Coordination Interventions:  Yes, provided   Follow up plan: Follow up call scheduled for 11/07/21    Encounter Outcome:  Pt. Visit Completed   Thea Silversmith, RN, MSN, BSN, [place] Coordinator [phone]

## 2021-10-07 NOTE — Patient Instructions (Signed)
Visit Information  Thank you for taking time to visit with me today. Please don't hesitate to contact me if I can be of assistance to you.   Following are the goals we discussed today:   Goals Addressed             This Visit's Progress    Patient Stated: loose weight       Care Coordination Interventions: Advised patient to discuss questions/concerns  with provider Encouraged increase exercise as recommended by providers Instructed on how to obtain the most reliable weights Encouraged to continue to eat healthy Social work referral regarding support for alcohol abstinence and social work concerns as needed           Our next appointment is by telephone on 11/07/21 at 10:30 am  Please call the care guide team at [phone] if you need to cancel or reschedule your appointment.   If you are experiencing a Mental Health or [place] or need someone to talk to, please call the Suicide and Crisis Lifeline: [phone]  Patient verbalizes understanding of instructions and care plan provided today and agrees to view in [place]. Active MyChart status and patient understanding of how to access instructions and care plan via MyChart confirmed with patient.      Thea Silversmith, RN, MSN, BSN, [facility] [phone]    Calorie Counting for Weight Loss Calories are units of energy. Your body needs a certain number of calories from food to keep going throughout the day. When you eat or drink more calories than your body needs, your body stores the extra calories mostly as fat. When you eat or drink fewer calories than your body needs, your body burns fat to get the energy it needs. Calorie counting means keeping track of how many calories you eat and drink each day. Calorie counting can be helpful if you need to lose weight. If you eat fewer calories than your body needs, you should lose weight. Ask your health care provider what a healthy weight is for you. For calorie  counting to work, you will need to eat the right number of calories each day to lose a healthy amount of weight per week. A dietitian can help you figure out how many calories you need in a day and will suggest ways to reach your calorie goal. A healthy amount of weight to lose each week is usually 1-2 lb (0.5-0.9 kg). This usually means that your daily calorie intake should be reduced by 500-750 calories. Eating 1,200-1,500 calories a day can help most women lose weight. Eating 1,500-1,800 calories a day can help most men lose weight. What do I need to know about calorie counting? Work with your health care provider or dietitian to determine how many calories you should get each day. To meet your daily calorie goal, you will need to: Find out how many calories are in each food that you would like to eat. Try to do this before you eat. Decide how much of the food you plan to eat. Keep a food log. Do this by writing down what you ate and how many calories it had. To successfully lose weight, it is important to balance calorie counting with a healthy lifestyle that includes regular activity. Where do I find calorie information?  The number of calories in a food can be found on a Nutrition Facts label. If a food does not have a Nutrition Facts label, try to look up the calories online or  ask your dietitian for help. Remember that calories are listed per serving. If you choose to have more than one serving of a food, you will have to multiply the calories per serving by the number of servings you plan to eat. For example, the label on a package of bread might say that a serving size is 1 slice and that there are 90 calories in a serving. If you eat 1 slice, you will have eaten 90 calories. If you eat 2 slices, you will have eaten 180 calories. How do I keep a food log? After each time that you eat, record the following in your food log as soon as possible: What you ate. Be sure to include toppings,  sauces, and other extras on the food. How much you ate. This can be measured in cups, ounces, or number of items. How many calories were in each food and drink. The total number of calories in the food you ate. Keep your food log near you, such as in a pocket-sized notebook or on an app or website on your mobile phone. Some programs will calculate calories for you and show you how many calories you have left to meet your daily goal. What are some portion-control tips? Know how many calories are in a serving. This will help you know how many servings you can have of a certain food. Use a measuring cup to measure serving sizes. You could also try weighing out portions on a kitchen scale. With time, you will be able to estimate serving sizes for some foods. Take time to put servings of different foods on your favorite plates or in your favorite bowls and cups so you know what a serving looks like. Try not to eat straight from a food's packaging, such as from a bag or box. Eating straight from the package makes it hard to see how much you are eating and can lead to overeating. Put the amount you would like to eat in a cup or on a plate to make sure you are eating the right portion. Use smaller plates, glasses, and bowls for smaller portions and to prevent overeating. Try not to multitask. For example, avoid watching TV or using your computer while eating. If it is time to eat, sit down at a table and enjoy your food. This will help you recognize when you are full. It will also help you be more mindful of what and how much you are eating. What are tips for following this plan? Reading food labels Check the calorie count compared with the serving size. The serving size may be smaller than what you are used to eating. Check the source of the calories. Try to choose foods that are high in protein, fiber, and vitamins, and low in saturated fat, trans fat, and sodium. Shopping Read nutrition labels while you  shop. This will help you make healthy decisions about which foods to buy. Pay attention to nutrition labels for low-fat or fat-free foods. These foods sometimes have the same number of calories or more calories than the full-fat versions. They also often have added sugar, starch, or salt to make up for flavor that was removed with the fat. Make a grocery list of lower-calorie foods and stick to it. Cooking Try to cook your favorite foods in a healthier way. For example, try baking instead of frying. Use low-fat dairy products. Meal planning Use more fruits and vegetables. One-half of your plate should be fruits and vegetables. Include lean proteins,  such as chicken, [place], and fish. Lifestyle Each week, aim to do one of the following: 150 minutes of moderate exercise, such as walking. 75 minutes of vigorous exercise, such as running. General information Know how many calories are in the foods you eat most often. This will help you calculate calorie counts faster. Find a way of tracking calories that works for you. Get creative. Try different apps or programs if writing down calories does not work for you. What foods should I eat?  Eat nutritious foods. It is better to have a nutritious, high-calorie food, such as an avocado, than a food with few nutrients, such as a bag of potato chips. Use your calories on foods and drinks that will fill you up and will not leave you hungry soon after eating. Examples of foods that fill you up are nuts and nut butters, vegetables, lean proteins, and high-fiber foods such as whole grains. High-fiber foods are foods with more than 5 g of fiber per serving. Pay attention to calories in drinks. Low-calorie drinks include water and unsweetened drinks. The items listed above may not be a complete list of foods and beverages you can eat. Contact a dietitian for more information. What foods should I limit? Limit foods or drinks that are not good sources of  vitamins, minerals, or protein or that are high in unhealthy fats. These include: Candy. Other sweets. Sodas, specialty coffee drinks, alcohol, and juice. The items listed above may not be a complete list of foods and beverages you should avoid. Contact a dietitian for more information. How do I count calories when eating out? Pay attention to portions. Often, portions are much larger when eating out. Try these tips to keep portions smaller: Consider sharing a meal instead of getting your own. If you get your own meal, eat only half of it. Before you start eating, ask for a container and put half of your meal into it. When available, consider ordering smaller portions from the menu instead of full portions. Pay attention to your food and drink choices. Knowing the way food is cooked and what is included with the meal can help you eat fewer calories. If calories are listed on the menu, choose the lower-calorie options. Choose dishes that include vegetables, fruits, whole grains, low-fat dairy products, and lean proteins. Choose items that are boiled, broiled, grilled, or steamed. Avoid items that are buttered, battered, fried, or served with cream sauce. Items labeled as crispy are usually fried, unless stated otherwise. Choose water, low-fat milk, unsweetened iced tea, or other drinks without added sugar. If you want an alcoholic beverage, choose a lower-calorie option, such as a glass of wine or light beer. Ask for dressings, sauces, and syrups on the side. These are usually high in calories, so you should limit the amount you eat. If you want a salad, choose a garden salad and ask for grilled meats. Avoid extra toppings such as bacon, cheese, or fried items. Ask for the dressing on the side, or ask for olive oil and vinegar or lemon to use as dressing. Estimate how many servings of a food you are given. Knowing serving sizes will help you be aware of how much food you are eating at  restaurants. Where to find more information Centers for Disease Control and Prevention: http://www.wolf.info/ U.S. Department of Agriculture: http://www.wilson-mendoza.org/ Summary Calorie counting means keeping track of how many calories you eat and drink each day. If you eat fewer calories than your body needs, you should lose  weight. A healthy amount of weight to lose per week is usually 1-2 lb (0.5-0.9 kg). This usually means reducing your daily calorie intake by 500-750 calories. The number of calories in a food can be found on a Nutrition Facts label. If a food does not have a Nutrition Facts label, try to look up the calories online or ask your dietitian for help. Use smaller plates, glasses, and bowls for smaller portions and to prevent overeating. Use your calories on foods and drinks that will fill you up and not leave you hungry shortly after a meal. This information is not intended to replace advice given to you by your health care provider. Make sure you discuss any questions you have with your health care provider. Document Revised: 03/10/2019 Document Reviewed: 03/10/2019 Elsevier Patient Education  [place].

## 2021-10-08 ENCOUNTER — Ambulatory Visit: Payer: Self-pay | Admitting: Licensed Clinical Social Worker

## 2021-10-08 ENCOUNTER — Ambulatory Visit
Admission: RE | Admit: 2021-10-08 | Discharge: 2021-10-08 | Disposition: A | Discharge status: Home / Self Care | Payer: 59 | Source: Ambulatory Visit | Attending: Internal Medicine | Admitting: Internal Medicine

## 2021-10-08 ENCOUNTER — Other Ambulatory Visit (HOSPITAL_COMMUNITY): Payer: Self-pay

## 2021-10-08 ENCOUNTER — Encounter: Payer: Self-pay | Admitting: Licensed Clinical Social Worker

## 2021-10-08 DIAGNOSIS — Z1231 Encounter for screening mammogram for malignant neoplasm of breast: Secondary | ICD-10-CM

## 2021-10-08 NOTE — Telephone Encounter (Signed)
Patient will call back to schedule with one of the APPS. Dr Lyndel Safe said she needs an office visit.

## 2021-10-08 NOTE — Patient Instructions (Signed)
     I am sorry you were unable to keep your phone appointment today.   Please call me if you would like to reschedule   [name], [place] [phone]

## 2021-10-08 NOTE — Patient Outreach (Signed)
  Care Coordination   10/08/2021 Name: [name] MRN: [id number] DOB: April 02, 1960   Care Coordination Outreach Attempts:  An unsuccessful telephone outreach was attempted for a scheduled appointment today.  Follow Up Plan:  will wait for return call if no return is received will f/u in 7 to 10 days Additional outreach attempts will be made to offer the patient care coordination information and services.   Encounter Outcome:  No Answer  Care Coordination Interventions Activated:  No   Care Coordination Interventions:  No, not indicated    Casimer Lanius, [place] [phone]

## 2021-10-15 NOTE — Telephone Encounter (Signed)
Patient just had a procedure and will call us back when she ready to schedule for an office visit with an app

## 2021-10-16 ENCOUNTER — Other Ambulatory Visit (HOSPITAL_COMMUNITY): Payer: Self-pay

## 2021-10-16 ENCOUNTER — Encounter: Payer: 59 | Admitting: Licensed Clinical Social Worker

## 2021-10-21 ENCOUNTER — Ambulatory Visit: Payer: 59 | Admitting: Podiatry

## 2021-10-21 ENCOUNTER — Telehealth: Payer: Self-pay | Admitting: *Deleted

## 2021-10-21 NOTE — Chronic Care Management (AMB) (Signed)
  Care Coordination  Outreach Note  10/21/2021 Name: [name] MRN: [id number] DOB: [birth year]   Care Coordination Outreach Attempts: An unsuccessful telephone outreach was attempted today to offer the patient information about available care coordination services as a benefit of their health plan.   Rescheduling   Follow Up Plan:  Additional outreach attempts will be made to offer the patient care coordination information and services.   Encounter Outcome:  No Answer  Julian Hy, [place] Direct Dial: [phone]

## 2021-10-24 ENCOUNTER — Other Ambulatory Visit (HOSPITAL_COMMUNITY): Payer: Self-pay

## 2021-10-31 DIAGNOSIS — N201 Calculus of ureter: Secondary | ICD-10-CM | POA: Diagnosis not present

## 2021-10-31 NOTE — Chronic Care Management (AMB) (Signed)
Pt since has been scheduled for follow up with RN for 11/07/2021

## 2021-11-01 ENCOUNTER — Ambulatory Visit: Payer: 59 | Admitting: Internal Medicine

## 2021-11-01 ENCOUNTER — Ambulatory Visit: Payer: 59 | Admitting: Podiatry

## 2021-11-04 ENCOUNTER — Telehealth: Payer: Self-pay | Admitting: Internal Medicine

## 2021-11-04 MED ORDER — AMOXICILLIN-POT CLAVULANATE 875-125 MG PO TABS: 1.0000 | ORAL_TABLET | Freq: Two times a day (BID) | ORAL | 0 refills | Status: DC

## 2021-11-04 MED ORDER — PREDNISONE 10 MG PO TABS: ORAL_TABLET | ORAL | 0 refills | Status: DC

## 2021-11-04 NOTE — Telephone Encounter (Signed)
I called and spoke with the pt and notified of response per Dr Melvyn Novas  She verbalized understanding  Rxs were sent to pharm  Nothing further needed

## 2021-11-04 NOTE — Telephone Encounter (Signed)
Patient last seen 08/2020. Pending appt 11/11/2021. Hx of PE. C/o prod cough with brownish mucus, wheezing at night and increased SOB with exertion x3w. Denied f/c/s, chest pain, edema or additional sx.  Using albuterol HFA QID.   Dr. Melvyn Novas, please advise. Thanks

## 2021-11-04 NOTE — Telephone Encounter (Signed)
Augmentin 875 mg take one pill twice daily  X 10 days - take at breakfast and supper with large glass of water.  It would help reduce the usual side effects (diarrhea and yeast infections) if you ate cultured yogurt at lunch.   Prednisone 10 mg take  4 each am x 2 days,   2 each am x 2 days,  1 each am x 2 days and stop  

## 2021-11-06 ENCOUNTER — Encounter: Payer: Self-pay | Admitting: Internal Medicine

## 2021-11-06 ENCOUNTER — Ambulatory Visit (INDEPENDENT_AMBULATORY_CARE_PROVIDER_SITE_OTHER): Payer: 59 | Admitting: Internal Medicine

## 2021-11-06 ENCOUNTER — Ambulatory Visit (INDEPENDENT_AMBULATORY_CARE_PROVIDER_SITE_OTHER): Payer: 59 | Admitting: Podiatry

## 2021-11-06 VITALS — BP 144/90 | HR 71 | Temp 98.2°F | Ht 61.0 in | Wt 287.0 lb

## 2021-11-06 DIAGNOSIS — M79609 Pain in unspecified limb: Secondary | ICD-10-CM | POA: Diagnosis not present

## 2021-11-06 DIAGNOSIS — R69 Illness, unspecified: Secondary | ICD-10-CM | POA: Diagnosis not present

## 2021-11-06 DIAGNOSIS — R062 Wheezing: Secondary | ICD-10-CM | POA: Diagnosis not present

## 2021-11-06 DIAGNOSIS — Z7901 Long term (current) use of anticoagulants: Secondary | ICD-10-CM

## 2021-11-06 DIAGNOSIS — R7302 Impaired glucose tolerance (oral): Secondary | ICD-10-CM | POA: Diagnosis not present

## 2021-11-06 DIAGNOSIS — E1149 Type 2 diabetes mellitus with other diabetic neurological complication: Secondary | ICD-10-CM | POA: Diagnosis not present

## 2021-11-06 DIAGNOSIS — B351 Tinea unguium: Secondary | ICD-10-CM | POA: Diagnosis not present

## 2021-11-06 DIAGNOSIS — E7800 Pure hypercholesterolemia, unspecified: Secondary | ICD-10-CM

## 2021-11-06 DIAGNOSIS — R051 Acute cough: Secondary | ICD-10-CM

## 2021-11-06 DIAGNOSIS — R059 Cough, unspecified: Secondary | ICD-10-CM | POA: Insufficient documentation

## 2021-11-06 DIAGNOSIS — E1140 Type 2 diabetes mellitus with diabetic neuropathy, unspecified: Secondary | ICD-10-CM | POA: Diagnosis not present

## 2021-11-06 DIAGNOSIS — F411 Generalized anxiety disorder: Secondary | ICD-10-CM

## 2021-11-06 DIAGNOSIS — R609 Edema, unspecified: Secondary | ICD-10-CM | POA: Diagnosis not present

## 2021-11-06 DIAGNOSIS — M7751 Other enthesopathy of right foot: Secondary | ICD-10-CM

## 2021-11-06 DIAGNOSIS — F32A Depression, unspecified: Secondary | ICD-10-CM

## 2021-11-06 LAB — BASIC METABOLIC PANEL
BUN: 19 mg/dL (ref 6–23)
CO2: 33 mEq/L — ABNORMAL HIGH (ref 19–32)
Calcium: 10.1 mg/dL (ref 8.4–10.5)
Chloride: 100 mEq/L (ref 96–112)
Creatinine, Ser: 1.31 mg/dL — ABNORMAL HIGH (ref 0.40–1.20)
GFR: 44.25 mL/min — ABNORMAL LOW (ref >60.00)
Glucose, Bld: 112 mg/dL — ABNORMAL HIGH (ref 70–99)
Potassium: 4 mEq/L (ref 3.5–5.1)
Sodium: 139 mEq/L (ref 135–145)

## 2021-11-06 LAB — CBC WITH DIFFERENTIAL/PLATELET
Basophils Absolute: 0.1 10*3/uL (ref 0.0–0.1)
Basophils Relative: 0.4 % (ref 0.0–3.0)
Eosinophils Absolute: 0.4 10*3/uL (ref 0.0–0.7)
Eosinophils Relative: 3.2 % (ref 0.0–5.0)
HCT: 39.8 % (ref 36.0–46.0)
Hemoglobin: 12.5 g/dL (ref 12.0–15.0)
Lymphocytes Relative: 15.9 % (ref 12.0–46.0)
Lymphs Abs: 2 10*3/uL (ref 0.7–4.0)
MCHC: 31.4 g/dL (ref 30.0–36.0)
MCV: 91.1 fl (ref 78.0–100.0)
Monocytes Absolute: 0.7 10*3/uL (ref 0.1–1.0)
Monocytes Relative: 5.7 % (ref 3.0–12.0)
Neutro Abs: 9.5 10*3/uL — ABNORMAL HIGH (ref 1.4–7.7)
Neutrophils Relative %: 74.8 % (ref 43.0–77.0)
Platelets: 203 10*3/uL (ref 150.0–400.0)
RBC: 4.37 Mil/uL (ref 3.87–5.11)
RDW: 14.2 % (ref 11.5–15.5)
WBC: 12.7 10*3/uL — ABNORMAL HIGH (ref 4.0–10.5)

## 2021-11-06 LAB — URINALYSIS, ROUTINE W REFLEX MICROSCOPIC
Bilirubin Urine: NEGATIVE
Hgb urine dipstick: NEGATIVE
Ketones, ur: NEGATIVE
Leukocytes,Ua: NEGATIVE
Nitrite: NEGATIVE
Specific Gravity, Urine: 1.01 (ref 1.000–1.030)
Total Protein, Urine: NEGATIVE
Urine Glucose: NEGATIVE
Urobilinogen, UA: 0.2 (ref 0.0–1.0)
pH: 7 (ref 5.0–8.0)

## 2021-11-06 LAB — HEPATIC FUNCTION PANEL
ALT: 20 U/L (ref 0–35)
AST: 25 U/L (ref 0–37)
Albumin: 3.5 g/dL (ref 3.5–5.2)
Alkaline Phosphatase: 109 U/L (ref 39–117)
Bilirubin, Direct: 0.1 mg/dL (ref 0.0–0.3)
Total Bilirubin: 0.5 mg/dL (ref 0.2–1.2)
Total Protein: 7.3 g/dL (ref 6.0–8.3)

## 2021-11-06 LAB — LIPID PANEL
Cholesterol: 173 mg/dL (ref 0–200)
HDL: 64.8 mg/dL (ref >39.00)
LDL Cholesterol: 91 mg/dL (ref 0–99)
NonHDL: 107.8
Total CHOL/HDL Ratio: 3
Triglycerides: 86 mg/dL (ref 0.0–149.0)
VLDL: 17.2 mg/dL (ref 0.0–40.0)

## 2021-11-06 LAB — BRAIN NATRIURETIC PEPTIDE: Pro B Natriuretic peptide (BNP): 186 pg/mL — ABNORMAL HIGH (ref 0.0–100.0)

## 2021-11-06 LAB — HEMOGLOBIN A1C: Hgb A1c MFr Bld: 6.5 % (ref 4.6–6.5)

## 2021-11-06 LAB — TSH: TSH: 2.07 u[IU]/mL (ref 0.35–5.50)

## 2021-11-06 MED ORDER — TRIAMCINOLONE ACETONIDE 10 MG/ML IJ SUSP
10.0000 mg | Freq: Once | INTRAMUSCULAR | Status: AC
  Administered 2021-11-06: 10 mg

## 2021-11-06 MED ORDER — TRIAMCINOLONE ACETONIDE 0.1 % EX CREA: 1.0000 | TOPICAL_CREAM | Freq: Two times a day (BID) | CUTANEOUS | 2 refills | Status: DC | PRN

## 2021-11-06 MED ORDER — METHOCARBAMOL 500 MG PO TABS: 500.0000 mg | ORAL_TABLET | Freq: Four times a day (QID) | ORAL | 2 refills | Status: DC

## 2021-11-06 MED ORDER — LORAZEPAM 0.5 MG PO TABS: 0.5000 mg | ORAL_TABLET | Freq: Two times a day (BID) | ORAL | 2 refills | Status: DC | PRN

## 2021-11-06 MED ORDER — HYDROCODONE BIT-HOMATROP MBR 5-1.5 MG/5ML PO SOLN: 5.0000 mL | Freq: Four times a day (QID) | ORAL | 0 refills | Status: AC | PRN

## 2021-11-06 MED ORDER — RIVAROXABAN 10 MG PO TABS: 10.0000 mg | ORAL_TABLET | Freq: Every day | ORAL | 3 refills | Status: DC

## 2021-11-06 NOTE — Assessment & Plan Note (Signed)
Lab Results  Component Value Date   LDLCALC 90 03/11/2021   Stable, pt to continue current statin lipitor 20 mg qd

## 2021-11-06 NOTE — Assessment & Plan Note (Signed)
Mild , declines depomedrol today, to continue prednisone and inhaler prn

## 2021-11-06 NOTE — Assessment & Plan Note (Signed)
For bnp with labs

## 2021-11-06 NOTE — Progress Notes (Signed)
Patient ID: [name], female   DOB: 1960/06/16, 61 y.o.   MRN: [id number]        Chief Complaint: follow up anxiety/panic, depression, chronic anticoagulation, htn       HPI:  [name] is a 61 y.o. female here after had received workman comp settlement and no longer working at her former job all related to right bicep tendon rupture.  Looking for different work.  Gained several lbs with being less active recent.  Since loss of job, has had 2 mo gradually  worsening depressive symptoms, without suicidal ideation, but occasional panic symptoms; has ongoing anxiety prior as well.  Lorazepam prn has worked well in past.  Also states she simply cannot remember to take her PM eliquis dose, wondering if something different.  Did also have recent renal stone now resolved.  Also started augmentin and prednisone per pulmonary sept 25, has inhaler and overall feels some improved.    Wt Readings from Last 3 Encounters:  11/06/21 287 lb (130.2 kg)  09/30/21 279 lb (126.6 kg)  09/25/21 279 lb (126.6 kg)   BP Readings from Last 3 Encounters:  11/06/21 (!) 144/90  09/30/21 (!) 151/78  09/25/21 (!) 149/83         Past Medical History:  Diagnosis Date   Abscess of bladder 07/28/2016   Alcohol dependence ([place])    Allergic rhinitis 12/06/2013   Anxiety    Asthma 03/16/2015   Atrial flutter with rapid ventricular response ([place]) 04/01/2020   COLONIC POLYPS, HX OF 04/05/2010   DIVERTICULITIS, HX OF 04/05/2010   DJD (degenerative joint disease)    right knee, mot to severe   Dyspnea    Dysrhythmia    GERD (gastroesophageal reflux disease)    no meds   Heart murmur    hx of    History of kidney stones    Hyperlipidemia    Hypertension    Impaired glucose tolerance 12/06/2013   Morbid obesity with BMI of 50.0-59.9, adult ([place])    Peripheral vascular disease ([place])    Pneumonia    Pulmonary embolism ([place])    Past Surgical History:  Procedure Laterality Date   ABDOMINAL HYSTERECTOMY   age 48   fibroids   BREAST BIOPSY Left    COLONOSCOPY WITH PROPOFOL N/A 07/25/2016   Procedure: COLONOSCOPY WITH PROPOFOL;  Surgeon: Carol Ada, MD;  Location: [facility];  Service: Endoscopy;  Laterality: N/A;   colonscopy     x 2   CYSTOSCOPY W/ URETERAL STENT PLACEMENT Right 09/05/2021   Procedure: CYSTOSCOPY WITH RETROGRADE PYELOGRAM/URETERAL STENT PLACEMENT;  Surgeon: Janith Lima, MD;  Location: [facility];  Service: Urology;  Laterality: Right;   CYSTOSCOPY/URETEROSCOPY/HOLMIUM LASER/STENT PLACEMENT Right 09/30/2021   Procedure: CYSTOSCOPY/ RETROGRADE/URETEROSCOPY/HOLMIUM LASER/STENT PLACEMENT;  Surgeon: Janith Lima, MD;  Location: [facility];  Service: Urology;  Laterality: Right;   IR RADIOLOGIST EVAL & MGMT  08/12/2016   IR RADIOLOGIST EVAL & MGMT  08/21/2016   KNEE ARTHROSCOPY     left    TOTAL KNEE ARTHROPLASTY  07/29/2011   Procedure: TOTAL KNEE ARTHROPLASTY;  Surgeon: Mauri Pole, MD;  Location: [facility];  Service: Orthopedics;  Laterality: Right;   TOTAL KNEE ARTHROPLASTY Left 12/14/2012   Procedure: LEFT TOTAL KNEE ARTHROPLASTY;  Surgeon: Mauri Pole, MD;  Location: [facility];  Service: Orthopedics;  Laterality: Left;    reports that she quit smoking about 33 years ago. Her smoking use included cigarettes. She has a 3.50 pack-year smoking  history. She has never used smokeless tobacco. She reports that she does not currently use alcohol. She reports that she does not currently use drugs after having used the following drugs: Marijuana. family history includes Alcoholism in her sister; COPD in her father; Lymphoma in her sister; Stroke in her mother. Allergies  Allergen Reactions   Morphine Itching   Shrimp [Shellfish Allergy] Itching and Other (See Comments)    Tongue burns also   Tramadol Other (See Comments)    Caused confusion   Covid-19 ([place]) Vaccine Hives   Diltiazem Hcl Itching    Pt with itching of the feet when bolus given   Other Other (See Comments)    Latex Rash   Current Outpatient Medications on File Prior to Visit  Medication Sig Dispense Refill   albuterol (VENTOLIN HFA) 108 (90 Base) MCG/ACT inhaler 2 puffs every 6 hrs as needed 8.5 each 11   amoxicillin-clavulanate (AUGMENTIN) 875-125 MG tablet Take 1 tablet by mouth 2 (two) times daily. 20 tablet 0   atorvastatin (LIPITOR) 20 MG tablet TAKE 1 TABLET BY MOUTH EVERY DAY 90 tablet 3   Brimonidine Tartrate (LUMIFY) 0.025 % SOLN Place 1 drop into both eyes daily as needed (redness).     calcium carbonate (TUMS - DOSED IN MG ELEMENTAL CALCIUM) 500 MG chewable tablet Chew 1 tablet by mouth daily as needed for indigestion or heartburn.     docusate sodium (COLACE) 100 MG capsule Take 1 capsule (100 mg total) by mouth daily as needed for up to 30 doses. 30 capsule 0   losartan-hydrochlorothiazide (HYZAAR) 100-25 MG tablet Take 1 tablet by mouth daily.     metoprolol succinate (TOPROL-XL) 25 MG 24 hr tablet Take 1 tablet (25 mg total) by mouth daily. 30 tablet 11   Multiple Vitamin (MULTIVITAMIN WITH MINERALS) TABS tablet Take 1 tablet by mouth daily.     ondansetron (ZOFRAN-ODT) 4 MG disintegrating tablet TAKE 1 TABLET BY MOUTH EVERY 8 HOURS AS NEEDED 20 tablet 2   thiamine (VITAMIN B1) 100 MG tablet Take 1 tablet (100 mg total) by mouth daily. 30 tablet 0   No current facility-administered medications on file prior to visit.        ROS:  All others reviewed and negative.  Objective        PE:  BP (!) 144/90 (BP Location: Right Arm, Patient Position: Sitting, Cuff Size: Large)   Pulse 71   Temp 98.2 F (36.8 C) (Oral)   Ht '5\' 1"'$  (1.549 m)   Wt 287 lb (130.2 kg)   SpO2 97%   BMI 54.23 kg/m                 Constitutional: Pt appears in NAD               HENT: Head: NCAT.                Right Ear: External ear normal.                 Left Ear: External ear normal.                Eyes: . Pupils are equal, round, and reactive to light. Conjunctivae and EOM are normal                Nose: without d/c or deformity               Neck: Neck supple. Gross normal ROM  Cardiovascular: Normal rate and regular rhythm.                 Pulmonary/Chest: Effort normal and breath sounds somewhat decreased without rales but with few bilat wheezing.                Abd:  Soft, NT, ND, + BS, no organomegaly               Neurological: Pt is alert. At baseline orientation, motor grossly intact               Skin: Skin is warm. No rashes, no other new lesions, LE edema - trace bilateral               Psychiatric: Pt behavior is normal without agitation ; depressed affect  Micro: none  Cardiac tracings I have personally interpreted today:  none  Pertinent Radiological findings (summarize): none   Lab Results  Component Value Date   WBC 14.8 (H) 09/25/2021   HGB 12.2 09/25/2021   HCT 39.1 09/25/2021   PLT 310 09/25/2021   GLUCOSE 119 (H) 09/25/2021   CHOL 168 03/11/2021   TRIG 81.0 03/11/2021   HDL 61.50 03/11/2021   LDLDIRECT 107.0 02/23/2012   LDLCALC 90 03/11/2021   ALT 46 (H) 09/04/2021   AST 58 (H) 09/04/2021   NA 141 09/25/2021   K 4.0 09/25/2021   CL 107 09/25/2021   CREATININE 1.31 (H) 09/25/2021   BUN 25 (H) 09/25/2021   CO2 24 09/25/2021   TSH 3.34 03/11/2021   INR 1.11 07/28/2016   HGBA1C 6.2 03/11/2021   Assessment/Plan:  [name] is a 61 y.o. Black or African American [2] female with  has a past medical history of Abscess of bladder (07/28/2016), Alcohol dependence ([place]), Allergic rhinitis (12/06/2013), Anxiety, Asthma (03/16/2015), Atrial flutter with rapid ventricular response ([place]) (04/01/2020), COLONIC POLYPS, HX OF (04/05/2010), DIVERTICULITIS, HX OF (04/05/2010), DJD (degenerative joint disease), Dyspnea, Dysrhythmia, GERD (gastroesophageal reflux disease), Heart murmur, History of kidney stones, Hyperlipidemia, Hypertension, Impaired glucose tolerance (12/06/2013), Morbid obesity with BMI of 50.0-59.9, adult ([place]), Peripheral vascular  disease ([place]), Pneumonia, and Pulmonary embolism ([place]).  Chronic anticoagulation Pt states just cannot remember to take the eliquis twice per day, so only takes in the AM only;  [place] for change to once daily xarelto 10 mg   Cough Improved, cont augmentin to finish course  Depression New onset, d/w pt - declines SSRI trial for now, but will call back if changes her mind; declines counseling or psychiatry referral at this time  Hyperlipidemia Lab Results  Component Value Date   [place] 90 03/11/2021   Stable, pt to continue current statin lipitor 20 mg qd   Impaired glucose tolerance Lab Results  Component Value Date   HGBA1C 6.2 03/11/2021   Stable, pt to continue current medical treatment - diet, wt control, excercise   Peripheral edema For bnp with labs  Wheezing Mild , declines depomedrol today, to continue prednisone and inhaler prn  Anxiety state With intermittent panic, for restart lorazepam prn use only 1 mg  Followup: Return in about 3 months (around 02/05/2022).  Cathlean Cower, MD 11/06/2021 1:12 PM [facility]

## 2021-11-06 NOTE — Assessment & Plan Note (Signed)
Lab Results  Component Value Date   HGBA1C 6.2 03/11/2021   Stable, pt to continue current medical treatment - diet, wt control, excercise

## 2021-11-06 NOTE — Assessment & Plan Note (Signed)
With intermittent panic, for restart lorazepam prn use only 1 mg

## 2021-11-06 NOTE — Assessment & Plan Note (Signed)
New onset, d/w pt - declines SSRI trial for now, but will call back if changes her mind; declines counseling or psychiatry referral at this time

## 2021-11-06 NOTE — Assessment & Plan Note (Signed)
Improved, cont augmentin to finish course

## 2021-11-06 NOTE — Patient Instructions (Addendum)
Ok to stop the eliquis  Please take all new medication as prescribed - the xarelto 10 mg per day  Please continue all other medications as before, including your current antibiotic, prednisone, and inhlaer, as well as the robaxin restart, lorazepam, and cough medicine as needed  Please call if you change your mind about starting Celexa 10 mg for depression  Please have the pharmacy call with any other refills you may need.  Please keep your appointments with your specialists as you may have planned

## 2021-11-06 NOTE — Assessment & Plan Note (Addendum)
Pt states just cannot remember to take the eliquis twice per day, so only takes in the AM only;  [place] for change to once daily xarelto 10 mg

## 2021-11-07 ENCOUNTER — Encounter: Payer: Self-pay | Admitting: Internal Medicine

## 2021-11-07 ENCOUNTER — Ambulatory Visit: Payer: Self-pay

## 2021-11-07 DIAGNOSIS — I10 Essential (primary) hypertension: Secondary | ICD-10-CM

## 2021-11-07 NOTE — Telephone Encounter (Signed)
This number is slightly high only - numbers over 400 to 2000 would be more significant, so this would not need change in tx at this time, thanks

## 2021-11-07 NOTE — Progress Notes (Signed)
Subjective:   Patient ID: [name], female   DOB: 61 y.o.   MRN: [id number]   HPI Long-term obese female who has long-term diabetes and has pain with her nails difficult for her to cut with several of them having been removed in the past and has fluid buildup with pain of the fifth digit right foot with minimal keratotic tissue formation   ROS      Objective:  Physical Exam  Patient is found to have neurovascular status which is stable with moderate diminishment of sharp dull vibratory with inflammation fluid of the inner phalangeal joint digit 5 right painful when pressed and thick incurvated nailbeds 1-5 both feet with the hallux nails partially removed from the past     Assessment:  Inflammatory capsulitis digit 5 right with inflammation of the inner phalangeal joint along with chronic painful mycotic nail infections with long-term diabetic with difficulty taking care of these     Plan:  Reviewed conditions and the importance of diabetic inspections of her feet and I went ahead today did sterile prep and injected the inner phalangeal joint digit 5 right 2 mg dexamethasone Kenalog 3 mg Xylocaine and then debrided nailbeds that are painful 1-5 both feet no angiogenic bleeding and will be seen back on an as-needed basis

## 2021-11-07 NOTE — Patient Outreach (Signed)
  Care Coordination   Follow Up Visit Note   11/07/2021 Name: [name] MRN: [id number] DOB: 03-20-1960  [name] is a 61 y.o. year old female who sees Susan Borg, MD for primary care. I spoke with  [name] by phone today.  What matters to the patients health and wellness today?  [name] reports she is doing well. She reports she has lost 12-13 pounds since she was discharged from the hospital. She reports she does not want to work on this goal at this time with care coordinator. She states she will continue to work walk for exercise and eat more fruits. She reports she has been 61 days abstinence from alcohol and has support of her husband. She declines social work support at this time. She is receptive to care guide referral re: SDOH for community resources.   Goals Addressed             This Visit's Progress    Community resource needs       Care Coordination Interventions: Care Guide referral for community resource needs      COMPLETED: Patient Stated: loose weight       Care Coordination Interventions: Encouraged to continue to exercise as recommended by providers Encouraged to continue to eat healthy         SDOH assessments and interventions completed:  Yes  SDOH Interventions Today    Flowsheet Row Most Recent Value  SDOH Interventions   Housing Interventions Other (Comment)  [care guide referral]  Utilities Interventions Other (Comment)  [care guide referral]  Financial Strain Interventions Other (Comment)  [care guide referral]     Care Coordination Interventions Activated:  Yes  Care Coordination Interventions:  Yes, provided   Follow up plan: Follow up call scheduled for 11/18/21    Encounter Outcome:  Pt. Visit Completed   Thea Silversmith, RN, MSN, BSN, [place] Coordinator [phone]

## 2021-11-07 NOTE — Patient Instructions (Signed)
Visit Information  Thank you for taking time to visit with me today. Please don't hesitate to contact me if I can be of assistance to you.   Following are the goals we discussed today:   Goals Addressed             This Visit's Progress    Community resource needs       Care Coordination Interventions: Care Guide referral for community resource needs      COMPLETED: Patient Stated: loose weight       Care Coordination Interventions: Encouraged to continue to exercise as recommended by providers Encouraged to continue to eat healthy         Our next appointment is by telephone on 11/18/21 at 10:00 am  Please call the care guide team at [phone] if you need to cancel or reschedule your appointment.   If you are experiencing a Mental Health or [place] or need someone to talk to, please call the Suicide and Crisis Lifeline: 988  Patient verbalizes understanding of instructions and care plan provided today and agrees to view in [place]. Active MyChart status and patient understanding of how to access instructions and care plan via MyChart confirmed with patient.     Thea Silversmith, RN, MSN, BSN, [place] Coordinator [phone]

## 2021-11-11 ENCOUNTER — Telehealth: Payer: Self-pay

## 2021-11-11 ENCOUNTER — Ambulatory Visit: Payer: 59 | Admitting: Internal Medicine

## 2021-11-11 ENCOUNTER — Telehealth (HOSPITAL_COMMUNITY): Payer: Self-pay | Admitting: *Deleted

## 2021-11-11 MED ORDER — RIVAROXABAN 20 MG PO TABS: 20.0000 mg | ORAL_TABLET | Freq: Every day | ORAL | 3 refills | Status: DC

## 2021-11-11 NOTE — Telephone Encounter (Signed)
The only thing to do is what she normally does, with trying to watch her weight with too much fluids, low salt diet, and take all medication like fluid pill as she does

## 2021-11-11 NOTE — Progress Notes (Deleted)
[name], female    DOB: [birth year]      MRN: [id number]   Brief patient profile:  39 yobf quit smoking in 1990 / [place] asthma since childhood remotely seen by Tia Masker on advair 250 one daily and freq albuterol and then R Plantar fascitis around march 2020 and then toe surgery both sides May 6th 2020 then more sob May 15th > admitted 07/05/2018   Note spirometry 11/2012 nl    Admit date: 07/05/2018 Discharge date: 07/07/2018   Discharge Diagnoses:      Pulmonary embolism ([place])    Extrinsic asthma   Hypertension   Elevated troponin    Brief/Interim Summary:  61 y.o. female with history of hypertension, hyperlipidemia, asthma previous history of alcohol abuse states she only drinks occasionally nowadays presented to the ER because of nausea vomiting with some epigastric discomfort and burning sensation in the chest over the last 3 days PTA.     just recently had both  great toe nails removed in her left great toe nailbed has become more discolored but no discharge or pain.   ED Course:   CT angiogram of the chest which shows bilateral subsegmental pulmonary embolism with no strain pattern.  Patient was started on heparin and admitted for further work-up.  Labs also showed elevated LFTs with AST of 70 ALT of 55 bilirubin was 1.7.  CT angiogram of the chest done for PE also showed persistent lesion in the liver which as per the radiologist was possible hemangioma.   Hospital course: Pulmonary embolism with no strain pattern per CT chest.  Echocardiogram was done consistent with no severe strain noted, was started on a heparin drip and converted over to Eliquis on discharge.  Dopplers were checked which were negative but TDS.    Elevated troponin; these were marginally elevated at 0.04 and 0.03 the setting of PE.  They remained flat, no acute ischemic changes on EKG.  Patient will follow-up with her PCP for continued outpatient monitoring and further coronary stratification with referral to  cardiology.      History of Present Illness  07/28/2018  Pulmonary/ 1st office eval/Joshuajames Moehring re-establish re chronic doe/new pe Chief Complaint  Patient presents with   Consult    Pulmonary Embolism-H/O asthma has not needed rescue inhaler.  Dyspnea:  MMRC1 = can walk nl pace, flat grade, can't hurry or go uphills or steps s sob   Cough: none Sleep: on back / 2 pillows  SABA use: none on advair one daily Re Call me if trouble getting the eliquis - you need to be on it for 6 full months Weight control is simply a matter of calorie balance    01/14/2019  f/u ov/[name] re: PE  Dx 06/2018 on eliquis, asthma much better  Chief Complaint  Patient presents with   Follow-up    Breathing is doing well today. She is using her proair inhaler 3 x per wk on average.   Dyspnea:  MMRC1 = can walk nl pace, flat grade, can't hurry or go uphills or steps s sob   Cough: none Sleeping: 2 pillows  SABA use: as above  rec Only use your albuterol as a rescue medication  GERD diet    03/02/2020  f/u ov/[name] re:  PE / dx  06/2018 and mild asthma on advair  hfa 115 one bid  Chief Complaint  Patient presents with   Follow-up    Breathing is overall doing well. She uses her albuterol inhaler  about once per day. She states she has cough in the am- non prod.   Dyspnea: Not limited by breathing from desired activities but very sedentary   Cough: some throat clearing / some worse in am's attributes to overt HB  Sleeping: flat bed pillows  SABA use: rarely 02: none   Rec Ok to renew your Eliquis and keep working on the weight loss  To get the most out of exercise, you need to be continuously aware that you are short of breath GERD diet /lifestyle     07/20/2020  f/u ov/[name] re: acute cough  Chief Complaint  Patient presents with   Acute Visit    Productive cough for 2 days with green/ yellow mucus   Dyspnea: no change mild doe Cough: acute onset 6/8 with yellow mucus, no fever, neg covid test Sleeping:  ok p rob dm SABA use: no increase  02: none  Rec Zpak called into your pharmacy Prilosec 20 mg otc take 30 min before bfast and supper until cough is better then ok to stop No cough drops - use sugar free candy or lifesavers (not mint or methol products)  I will review your cxr and call if any other recommendations Call us next week if not better  Add:  cxr will need f/u either here or with PCP    09/03/2020  f/u ov/[name] re: mild asthma on advair 115  Chief Complaint  Patient presents with   Follow-up    1 month, patient does not have any concerns,    Dyspnea:  able to shop but occ riding cart  Cough: gone  Sleeping: bed flat 3 pillows / sometimes gerd  SABA use: prn doe and typically  each  am "barely using one inhaler a month" 02: none  Covid status:   vax x 3  Rec  GERD diet/ bed blocks Only use your albuterol as a rescue medication to be used if you can't catch your breath Ok to Try albuterol 15 in before an activity (on alternating days)  that you know would make you short of breath    Please schedule a follow up visit in 3 months but call sooner if needed  with all medications /inhalers/ solutions in hand    11/11/2021  f/u ov/[name] re: ***   maint on ***  No chief complaint on file.   Dyspnea:  *** Cough: *** Sleeping: *** SABA use: *** 02: *** Covid status:   ***   No obvious day to day or daytime variability or assoc excess/ purulent sputum or mucus plugs or hemoptysis or cp or chest tightness, subjective wheeze or overt sinus or hb symptoms.   *** without nocturnal  or early am exacerbation  of respiratory  c/o's or need for noct saba. Also denies any obvious fluctuation of symptoms with weather or environmental changes or other aggravating or alleviating factors except as outlined above   No unusual exposure hx or h/o childhood pna/ asthma or knowledge of premature birth.  Current Allergies, Complete Past Medical History, Past Surgical History, Family History,  and Social History were reviewed in [employer] record.  ROS  The following are not active complaints unless bolded Hoarseness, sore throat, dysphagia, dental problems, itching, sneezing,  nasal congestion or discharge of excess mucus or purulent secretions, ear ache,   fever, chills, sweats, unintended wt loss or wt gain, classically pleuritic or exertional cp,  orthopnea pnd or arm/hand swelling  or leg swelling, presyncope, palpitations, abdominal  pain, anorexia, nausea, vomiting, diarrhea  or change in bowel habits or change in bladder habits, change in stools or change in urine, dysuria, hematuria,  rash, arthralgias, visual complaints, headache, numbness, weakness or ataxia or problems with walking or coordination,  change in mood or  memory.        No outpatient medications have been marked as taking for the 11/11/21 encounter (Appointment) with Tanda Rockers, MD.                 Past Medical History:  Diagnosis Date   Alcohol abuse    abuse- moderate years ago - states only drinks now on weekends -2 to 8 driinks    Asthma    COLONIC POLYPS, HX OF 04/05/2010   DIVERTICULITIS, HX OF 04/05/2010   DJD (degenerative joint disease)    right knee, mot to severe   GERD (gastroesophageal reflux disease)    no meds   Heart murmur    hx of    Hyperlipidemia    Hypertension    Impaired glucose tolerance 12/06/2013   Obesity (BMI 30-39.9)    PALPITATIONS, HX OF 09/14/2007       Objective:    Wts  11/11/2021        *** 09/03/2020       293  07/20/2020       293   03/02/2020      300  01/14/19 290 lb (131.5 kg)  01/14/19 292 lb 9.6 oz (132.7 kg)  12/06/18 281 lb 9.6 oz (127.7 kg)    Vital signs reviewed  11/11/2021  - Note at rest 02 sats  ***% on ***   General appearance:    ***           Assessment

## 2021-11-11 NOTE — Telephone Encounter (Signed)
Made pt aware of Dr. Jenny Reichmann notes about being on a healthy diet, and also on the low salt diet

## 2021-11-11 NOTE — Telephone Encounter (Signed)
   Telephone encounter was:  Successful.  11/11/2021 Name: SOLIANA KITKO MRN: [id number] DOB: [birth year]  ILANA PREZIOSO is a 61 y.o. year old female who is a primary care patient of Jenny Reichmann, Hunt Oris, MD . The community resource team was consulted for assistance with Home Modifications and Financial Difficulties related to utilities and home repair.  Care guide performed the following interventions: Patient provided with information about care guide support team and interviewed to confirm resource needs.  Follow Up Plan:  Spoke with patient, she was unable to talk and asked that I call her later today.    [place] Resource Care Guide   [email or web address]  ?? [phone]   Website: [email or web address]

## 2021-11-11 NOTE — Telephone Encounter (Signed)
Pt switched to Xarelto to Eliquis due to having issues remembering evening dose of Eliquis frequently. Per Adline Peals PA pt should be on Xarelto '20mg'$  daily with food. Pt verbalized understanding.

## 2021-11-11 NOTE — Telephone Encounter (Signed)
   Telephone encounter was:  Successful.  11/11/2021 Name: [name] MRN: [id number] DOB: 03-19-1960  [name] is a 61 y.o. year old female who is a primary care patient of Biagio Borg, MD . The community resource team was consulted for assistance with Financial Difficulties related to utilities and home repairs.  Care guide performed the following interventions: Spoke with patient about the Crisis Intervention Emergency Assistance Program, [place] and [place].  Patient stated that she has looked into the CIP program but felt they requested too much information.  I explained that all assistance programs require your financial information in order to determine if you qualify for assistance.  She requested the numbers for the organizations and said she would contact them.   Follow Up Plan:  No further follow up planned at this time. The patient has been provided with needed resources.  [place] Resource Care Guide   [email or web address]  ?? [phone]   Website: [email or web address]  [email or web address]

## 2021-11-11 NOTE — Telephone Encounter (Signed)
Made pt aware of Dr. Jenny Reichmann notes in regards to her Brain natriuretic peptide  Pt wants to know why it high and how she can bring down

## 2021-11-13 ENCOUNTER — Ambulatory Visit (INDEPENDENT_AMBULATORY_CARE_PROVIDER_SITE_OTHER): Payer: 59 | Admitting: Nurse Practitioner

## 2021-11-13 ENCOUNTER — Encounter: Payer: Self-pay | Admitting: Nurse Practitioner

## 2021-11-13 VITALS — BP 132/82 | HR 73 | Ht 65.0 in | Wt 290.6 lb

## 2021-11-13 DIAGNOSIS — K219 Gastro-esophageal reflux disease without esophagitis: Secondary | ICD-10-CM

## 2021-11-13 DIAGNOSIS — M542 Cervicalgia: Secondary | ICD-10-CM | POA: Diagnosis not present

## 2021-11-13 DIAGNOSIS — M24812 Other specific joint derangements of left shoulder, not elsewhere classified: Secondary | ICD-10-CM | POA: Diagnosis not present

## 2021-11-13 DIAGNOSIS — J3089 Other allergic rhinitis: Secondary | ICD-10-CM | POA: Diagnosis not present

## 2021-11-13 DIAGNOSIS — M24811 Other specific joint derangements of right shoulder, not elsewhere classified: Secondary | ICD-10-CM | POA: Diagnosis not present

## 2021-11-13 DIAGNOSIS — J4540 Moderate persistent asthma, uncomplicated: Secondary | ICD-10-CM | POA: Diagnosis not present

## 2021-11-13 MED ORDER — MOMETASONE FURO-FORMOTEROL FUM 100-5 MCG/ACT IN AERO: 2.0000 | INHALATION_SPRAY | Freq: Two times a day (BID) | RESPIRATORY_TRACT | 5 refills | Status: DC

## 2021-11-13 MED ORDER — LEVOCETIRIZINE DIHYDROCHLORIDE 5 MG PO TABS: 5.0000 mg | ORAL_TABLET | Freq: Every evening | ORAL | 5 refills | Status: DC

## 2021-11-13 MED ORDER — FAMOTIDINE 20 MG PO TABS: 20.0000 mg | ORAL_TABLET | Freq: Two times a day (BID) | ORAL | 5 refills | Status: DC

## 2021-11-13 NOTE — Assessment & Plan Note (Signed)
GERD likely contributing to her asthma symptoms as well.  We will start her on H2 blocker with Pepcid twice a day.  If no improvement, may need to consider starting her on PPI therapy.

## 2021-11-13 NOTE — Patient Instructions (Addendum)
Continue Albuterol inhaler 2 puffs every 6 hours as needed for shortness of breath or wheezing. Notify if symptoms persist despite rescue inhaler/neb use.  Continue Eliquis 5 mg Twice daily until you run out then change to Xarelto 1 tab with supper, as previously prescribed   Restart Dulera 2 puffs Twice daily. Brush tongue and rinse mouth afterwards. This is your new maintenance inhaler that you need to use regardless of your symptoms Xyzal 1 tab daily for allergies/nasal congestion Pepcid 1 tab Twice daily for reflux   Follow up in 6 weeks with Dr. Melvyn Novas or Roxan Diesel NP to see how things are going. If symptoms do not improve or worsen, please contact office for sooner follow up or seek emergency care.

## 2021-11-13 NOTE — Assessment & Plan Note (Signed)
Allergy type symptoms that wax and wane with seasons.  She is unable to tolerate nasal sprays.  Recommended that she start daily antihistamine - she would prefer a prescription option due to cost. Rx for Xyzal sent today.

## 2021-11-13 NOTE — Assessment & Plan Note (Signed)
Poorly controlled eosinophilic asthma with recent exacerbation.  She is recovering well.  Still has frequent use of Saba throughout the day.  Because of this we will start her on maintenance inhaler.  She stated that she still has some Dulera at home.  Instructed her to begin using this 2 puffs twice a day and new Rx sent.  Asthma action plan in place.  Patient Instructions  Continue Albuterol inhaler 2 puffs every 6 hours as needed for shortness of breath or wheezing. Notify if symptoms persist despite rescue inhaler/neb use.  Continue Eliquis 5 mg Twice daily until you run out then change to Xarelto 1 tab with supper, as previously prescribed   Restart Dulera 2 puffs Twice daily. Brush tongue and rinse mouth afterwards. This is your new maintenance inhaler that you need to use regardless of your symptoms Xyzal 1 tab daily for allergies/nasal congestion Pepcid 1 tab Twice daily for reflux   Follow up in 6 weeks with Dr. Melvyn Novas or Roxan Diesel NP to see how things are going. If symptoms do not improve or worsen, please contact office for sooner follow up or seek emergency care.

## 2021-11-13 NOTE — Progress Notes (Unsigned)
$'@Patient'A$  ID: [name], female    DOB: [birth year], 61 y.o.   MRN: [id number]  Chief Complaint  Patient presents with   Follow-up    Pt f/u some SOB/wheezing. Inhalers are working well    Referring provider: Biagio Borg, MD  HPI: 61 year old female, former remote smoker followed for asthma and upper airway cough syndrome.  She is patient Dr. Marlene Lard and last seen in office 09/03/2020.  Past medical history significant for hypertension, pulmonary embolism on chronic anticoagulation, PAF, allergic tinnitus, GERD, EtOH abuse, anxiety, morbid obesity, HLD, vitamin D deficiency.  TEST/EVENTS:  2014 spirometry was normal  04/03/2020 echocardiogram: EF 50-55%, mild LVH. RV size and function nl. LA and RA mildly dilated. There is a samll pericardial effusion.  11/03/2020 CTA chest: previous PE's have resolved. enlarged subcarinal lymph node, 19 mm and unchanged or slightly decreased. There is subsegmental atelectasis in the lingula. Trachea and central bronchi are patent.   11/13/2021: Today-acute Patient presents today for acute visit.  She called into the office on 9/25 with symptoms of increased shortness of breath, productive cough with brown sputum and wheezing at night.  She was treated with Augmentin course and prednisone taper and advised to schedule follow-up appointment.  Today, she reports that she has completed her prednisone and has 1 day left of Augmentin.  She is feeling better.  Her cough is improving.  Still has an occasional cough with clear sputum.  Still has an occasional wheeze and shortness of breath with exertion, but this feels more similar to her baseline.  Denies any fever, chills, lower extremity swelling, orthopnea.  She did not have any known sick exposures prior to feeling bad.  She is not currently on any maintenance inhaler.  Using albuterol 3-4 times a day.  She also struggles with nasal congestion and watery eyes.  She is unable to use nasal sprays and is not on any  antihistamines.  She struggles with indigestion/reflux, especially at night.  She was previously on an antacid of some sort but has been off of this for quite some time.  She is currently only using Tums.  Denies any dysphagia, hoarseness, nausea/vomiting.  Allergies  Allergen Reactions   Morphine Itching   Shrimp [Shellfish Allergy] Itching and Other (See Comments)    Tongue burns also   Tramadol Other (See Comments)    Caused confusion   Covid-19 (Mrna) Vaccine Hives   Diltiazem Hcl Itching    Pt with itching of the feet when bolus given   Other Other (See Comments)   Latex Rash    Immunization History  Administered Date(s) Administered   PFIZER(Purple Top)SARS-COV-2 Vaccination 04/25/2019, 05/18/2019, 01/02/2020   Td 02/10/2002    Past Medical History:  Diagnosis Date   Abscess of bladder 07/28/2016   Alcohol dependence ([place])    Allergic rhinitis 12/06/2013   Anxiety    Asthma 03/16/2015   Atrial flutter with rapid ventricular response ([place]) 04/01/2020   COLONIC POLYPS, HX OF 04/05/2010   DIVERTICULITIS, HX OF 04/05/2010   DJD (degenerative joint disease)    right knee, mot to severe   Dyspnea    Dysrhythmia    GERD (gastroesophageal reflux disease)    no meds   Heart murmur    hx of    History of kidney stones    Hyperlipidemia    Hypertension    Impaired glucose tolerance 12/06/2013   Morbid obesity with BMI of 50.0-59.9, adult ([place])    Peripheral vascular  disease ([place])    Pneumonia    Pulmonary embolism ([facility])     Tobacco History: Social History   Tobacco Use  Smoking Status Former   Packs/day: 0.50   Years: 7.00   Total pack years: 3.50   Types: Cigarettes   Quit date: 02/11/1983   Years since quitting: 38.7  Smokeless Tobacco Never   Counseling given: Not Answered   Outpatient Medications Prior to Visit  Medication Sig Dispense Refill   albuterol (VENTOLIN HFA) 108 (90 Base) MCG/ACT inhaler 2 puffs every 6 hrs as needed 8.5 each 11    amoxicillin-clavulanate (AUGMENTIN) 875-125 MG tablet Take 1 tablet by mouth 2 (two) times daily. 20 tablet 0   atorvastatin (LIPITOR) 20 MG tablet TAKE 1 TABLET BY MOUTH EVERY DAY 90 tablet 3   Brimonidine Tartrate (LUMIFY) 0.025 % SOLN Place 1 drop into both eyes daily as needed (redness).     calcium carbonate (TUMS - DOSED IN MG ELEMENTAL CALCIUM) 500 MG chewable tablet Chew 1 tablet by mouth daily as needed for indigestion or heartburn.     docusate sodium (COLACE) 100 MG capsule Take 1 capsule (100 mg total) by mouth daily as needed for up to 30 doses. 30 capsule 0   HYDROcodone bit-homatropine (HYCODAN) 5-1.5 MG/5ML syrup Take 5 mLs by mouth every 6 (six) hours as needed for up to 10 days. 180 mL 0   LORazepam (ATIVAN) 0.5 MG tablet Take 1 tablet (0.5 mg total) by mouth 2 (two) times daily as needed for anxiety. 30 tablet 2   losartan-hydrochlorothiazide (HYZAAR) 100-25 MG tablet Take 1 tablet by mouth daily.     methocarbamol (ROBAXIN) 500 MG tablet Take 1 tablet (500 mg total) by mouth 4 (four) times daily. 60 tablet 2   metoprolol succinate (TOPROL-XL) 25 MG 24 hr tablet Take 1 tablet (25 mg total) by mouth daily. 30 tablet 11   Multiple Vitamin (MULTIVITAMIN WITH MINERALS) TABS tablet Take 1 tablet by mouth daily.     ondansetron (ZOFRAN-ODT) 4 MG disintegrating tablet TAKE 1 TABLET BY MOUTH EVERY 8 HOURS AS NEEDED 20 tablet 2   rivaroxaban (XARELTO) 20 MG TABS tablet Take 1 tablet (20 mg total) by mouth daily with supper. 30 tablet 3   thiamine (VITAMIN B1) 100 MG tablet Take 1 tablet (100 mg total) by mouth daily. 30 tablet 0   triamcinolone cream (KENALOG) 0.1 % Apply 1 Application topically 2 (two) times daily as needed (itching). 30 g 2   No facility-administered medications prior to visit.     Review of Systems:   Constitutional: No weight loss or gain, night sweats, fevers, chills, fatigue, or lassitude. HEENT: No headaches, difficulty swallowing, tooth/dental problems, or  sore throat. No sneezing, itching, ear ache. +nasal congestion, post nasal drip, watery eyes.  CV:  No chest pain, orthopnea, PND, swelling in lower extremities, anasarca, dizziness, palpitations, syncope Resp:  +shortness of breath with exertion (baseline); improved cough with clear sputum. No hemoptysis. No wheezing.  No chest wall deformity GI:  +heartburn, indigestion. No abdominal pain, nausea, vomiting, diarrhea, change in bowel habits, loss of appetite, bloody stools.  MSK:  No joint pain or swelling.  No decreased range of motion.  No back pain. Neuro: No dizziness or lightheadedness.  Psych: No depression or anxiety. Mood stable.     Physical Exam:  BP 132/82   Pulse 73   Ht '5\' 5"'$  (1.651 m)   Wt 290 lb 9.6 oz (131.8 kg)   SpO2 98%  BMI 48.36 kg/m   GEN: Pleasant, interactive, well-appearing; morbidly obese; in no acute distress. HEENT:  Normocephalic and atraumatic. PERRLA. Sclera white. Nasal turbinates boggy, moist and patent bilaterally. No rhinorrhea present. Oropharynx pink and moist, without exudate or edema. No lesions, ulcerations, or postnasal drip.  NECK:  Supple w/ fair ROM. No JVD present. Normal carotid impulses w/o bruits. Thyroid symmetrical with no goiter or nodules palpated. No lymphadenopathy.   CV: RRR, no m/r/g, no peripheral edema. Pulses intact, +2 bilaterally. No cyanosis, pallor or clubbing. PULMONARY:  Unlabored, regular breathing. Clear bilaterally A&P w/o wheezes/rales/rhonchi. No accessory muscle use.  GI: BS present and normoactive. Soft, non-tender to palpation. No organomegaly or masses detected. MSK: No erythema, warmth or tenderness. No deformities or joint swelling noted.  Neuro: A/Ox3. No focal deficits noted.   Skin: Warm, no lesions or rashe Psych: Normal affect and behavior. Judgement and thought content appropriate.     Lab Results:  CBC    Component Value Date/Time   WBC 12.7 (H) 11/06/2021 1049   RBC 4.37 11/06/2021 1049   HGB  12.5 11/06/2021 1049   HCT 39.8 11/06/2021 1049   PLT 203.0 11/06/2021 1049   MCV 91.1 11/06/2021 1049   MCH 29.5 09/25/2021 0953   MCHC 31.4 11/06/2021 1049   RDW 14.2 11/06/2021 1049   LYMPHSABS 2.0 11/06/2021 1049   MONOABS 0.7 11/06/2021 1049   EOSABS 0.4 11/06/2021 1049   BASOSABS 0.1 11/06/2021 1049    BMET    Component Value Date/Time   NA 139 11/06/2021 1049   K 4.0 11/06/2021 1049   CL 100 11/06/2021 1049   CO2 33 (H) 11/06/2021 1049   GLUCOSE 112 (H) 11/06/2021 1049   BUN 19 11/06/2021 1049   CREATININE 1.31 (H) 11/06/2021 1049   CALCIUM 10.1 11/06/2021 1049   GFRNONAA 47 (L) 09/25/2021 0953   GFRAA >60 09/29/2019 0427    BNP    Component Value Date/Time   BNP 57.6 05/23/2019 0600     Imaging:  No results found.  triamcinolone acetonide (KENALOG) 10 MG/ML injection 10 mg     Date Action Dose Route User   11/06/2021 1426 Given 10 mg Other Regal, Tamala Fothergill, DPM           No data to display          No results found for: "NITRICOXIDE"      Assessment & Plan:   Asthma, moderate persistent, poorly-controlled Poorly controlled eosinophilic asthma with recent exacerbation.  She is recovering well.  Still has frequent use of Saba throughout the day.  Because of this we will start her on maintenance inhaler.  She stated that she still has some Dulera at home.  Instructed her to begin using this 2 puffs twice a day and new Rx sent.  Asthma action plan in place.  Patient Instructions  Continue Albuterol inhaler 2 puffs every 6 hours as needed for shortness of breath or wheezing. Notify if symptoms persist despite rescue inhaler/neb use.  Continue Eliquis 5 mg Twice daily until you run out then change to Xarelto 1 tab with supper, as previously prescribed   Restart Dulera 2 puffs Twice daily. Brush tongue and rinse mouth afterwards. This is your new maintenance inhaler that you need to use regardless of your symptoms Xyzal 1 tab daily for  allergies/nasal congestion Pepcid 1 tab Twice daily for reflux   Follow up in 6 weeks with Dr. Melvyn Novas or Roxan Diesel NP to see how things are  going. If symptoms do not improve or worsen, please contact office for sooner follow up or seek emergency care.      GERD GERD likely contributing to her asthma symptoms as well.  We will start her on H2 blocker with Pepcid twice a day.  If no improvement, may need to consider starting her on PPI therapy.  Allergic rhinitis Allergy type symptoms that wax and wane with seasons.  She is unable to tolerate nasal sprays.  Recommended that she start daily antihistamine - she would prefer a prescription option due to cost. Rx for Xyzal sent today.    I spent 35 minutes of dedicated to the care of this patient on the date of this encounter to include pre-visit review of records, face-to-face time with the patient discussing conditions above, post visit ordering of testing, clinical documentation with the electronic health record, making appropriate referrals as documented, and communicating necessary findings to members of the patients care team.  Clayton Bibles, NP 11/13/2021  Pt aware and understands NP's role.

## 2021-11-14 ENCOUNTER — Encounter: Payer: Self-pay | Admitting: Nurse Practitioner

## 2021-11-15 ENCOUNTER — Ambulatory Visit: Payer: 59 | Admitting: Internal Medicine

## 2021-11-18 ENCOUNTER — Ambulatory Visit: Payer: Self-pay

## 2021-11-18 NOTE — Patient Instructions (Addendum)
Visit Information  Thank you for taking time to visit with me today. Please don't hesitate to contact me if I can be of assistance to you.   Following are the goals we discussed today:   Goals Addressed             This Visit's Progress    COMPLETED: Community resource needs       Care Coordination Interventions: Confirmed patient has received requested community resources       Managment of asthma symptoms       Care Coordination Interventions: Evaluation of current treatment plan related to asthma and patient's adherence to plan as established by provider Advised patient to contact pulmonologist if symptoms do not improve or worsen Discussed Asthma Action plan Discussed asthma Triggers and encouraged patient to be aware of her triggers Discussed importance of taking Pepcid and encouraged to start taking as recommended by pulmonologist       Our next appointment is by telephone on 01/01/22 at 10 am  Please call the care guide team at [phone] if you need to cancel or reschedule your appointment.   If you are experiencing a Mental Health or [place] or need someone to talk to, please call the Suicide and Crisis Lifeline: 988  Patient verbalizes understanding of instructions and care plan provided today and agrees to view in [place]. Active MyChart status and patient understanding of how to access instructions and care plan via MyChart confirmed with patient.     Thea Silversmith, RN, MSN, BSN, CCM [facility] Care Coordinator [phone]    Asthma Action Plan, Adult An asthma action plan helps you understand how to manage your asthma and what to do when you have an asthma attack. The action plan is a color-coded plan that lists the symptoms that indicate whether or not your condition is under control and what actions to take. If you have symptoms in the green zone, you are doing well. If you have symptoms in the yellow zone, you are having problems. If you have  symptoms in the red zone, you need medical care right away. Follow the plan that you and your health care provider develop. Review your plan with your health care provider at each visit. What triggers your asthma? Knowing the things that can trigger an asthma attack or make your asthma symptoms worse is very important. Talk to your health care provider about your asthma triggers and how to avoid them. Record your known asthma triggers here: _______________ What is your personal best peak flow reading? If you use a peak flow meter, determine your personal best reading. Record it here: _______________ [place]oka Cowden zone This zone means that your asthma is under control. You may not have any symptoms while you are in the green zone. This means that you: Have no coughing or wheezing, even while you are working or playing. Sleep through the night. Are breathing well. Have a peak flow reading that is above __________ (80% of your personal best or greater). If you are in the green zone, continue to manage your asthma as directed. Take these medicines every day: Controller medicine and dosage: _______________ Controller medicine and dosage: _______________ Controller medicine and dosage: _______________ Controller medicine and dosage: _______________ Before exercise, use this reliever or rescue medicine: _______________ Call your health care provider if you are using a reliever or rescue medicine more than 2-3 times a week. Yellow zone Symptoms in this zone mean that your condition may be getting worse. You may have  symptoms that interfere with exercise, are noticeably worse after exposure to triggers, or are worse at the first sign of a cold (upper respiratory infection). These may include: Waking from sleep. Coughing, especially at night or first thing in the morning. Mild wheezing. Chest tightness. A peak flow reading that is __________ to __________ (50-79% of your personal best). If you have any of  these symptoms: Add the following medicine to the ones that you use daily: Reliever or rescue medicine and dosage: _______________ Additional medicine and dosage: _______________ Call your health care provider if: You remain in the yellow zone for __________ hours. You are using a reliever or rescue medicine more than 2-3 times a week. Red zone Symptoms in this zone mean that you should get medical help right away. You will likely feel distressed and have symptoms at rest that restrict your activity. You are in the red zone if: You are breathing hard and quickly. Your nose opens wide, your ribs show, and your neck muscles become visible when you breathe in. Your lips, fingers, or toes are a bluish color. You have trouble speaking in full sentences. Your peak flow reading is less than __________ (less than 50% of your personal best). Your symptoms do not improve within 15-20 minutes after you use your reliever or rescue medicine (bronchodilator). If you have any of these symptoms: These symptoms represent a serious problem that is an emergency. Do not wait to see if the symptoms will go away. Get medical help right away. Call your local emergency services (911 in the [place] Do not drive yourself to the hospital. Use your reliever or rescue medicine. Start a nebulizer treatment or take 2-4 puffs from a metered-dose inhaler with a spacer. Repeat this action every 15-20 minutes until help arrives. Where to find more information You can find more information about asthma from: Centers for Disease Control and Prevention: http://www.wolf.info/ American Lung Association: www.lung.org This information is not intended to replace advice given to you by your health care provider. Make sure you discuss any questions you have with your health care provider. Document Revised: 03/22/2020 Document Reviewed: 03/22/2020 Elsevier Patient Education  [place].

## 2021-11-18 NOTE — Patient Outreach (Signed)
  Care Coordination   Follow Up Visit Note   11/18/2021 Name: [name] MRN: [id number] DOB: [birth year]  [name] is a 62 y.o. year old female who sees Susan Borg, MD for primary care. I spoke with  [name] by phone today.  What matters to the patients health and wellness today?  [name] reports she spoke with the care guide and received community resources. Recent visit to pulmonologist 11/13/21 for worsening asthma symptoms. Patient states she forgot to start taking Pepcid as recommended by pulmonologist.    Goals Addressed             This Visit's Progress    COMPLETED: Community resource needs       Care Coordination Interventions: Confirmed patient has received requested community resources       Managment of asthma symptoms       Care Coordination Interventions: Evaluation of current treatment plan related to asthma and patient's adherence to plan as established by provider Advised patient to contact pulmonologist if symptoms do not improve or worsen Discussed Asthma Action plan Discussed asthma Triggers and encouraged patient to be aware of her triggers Discussed importance of taking Pepcid and encouraged to start taking as recommended by pulmonologist       SDOH assessments and interventions completed:  No  Care Coordination Interventions Activated:  Yes  Care Coordination Interventions:  Yes, provided   Follow up plan: Follow up call scheduled for 01/01/22    Encounter Outcome:  Pt. Visit Completed   Thea Silversmith, RN, MSN, BSN, [place] Coordinator [phone]

## 2021-11-19 ENCOUNTER — Telehealth: Payer: Self-pay | Admitting: Internal Medicine

## 2021-11-19 DIAGNOSIS — J4540 Moderate persistent asthma, uncomplicated: Secondary | ICD-10-CM

## 2021-11-19 MED ORDER — PANTOPRAZOLE SODIUM 40 MG PO TBEC: 40.0000 mg | DELAYED_RELEASE_TABLET | Freq: Every day | ORAL | 2 refills | Status: DC

## 2021-11-19 MED ORDER — MOMETASONE FURO-FORMOTEROL FUM 100-5 MCG/ACT IN AERO: 2.0000 | INHALATION_SPRAY | Freq: Two times a day (BID) | RESPIRATORY_TRACT | 5 refills | Status: DC

## 2021-11-19 NOTE — Telephone Encounter (Signed)
Please sent in protonix 40 mg daily. She can still use the pepcid for breakthrough reflux/indigestion if she needs to.  Rare but serious side effects that you should report to your care team as soon as possible:  Allergic reactions-skin rash, itching, hives, swelling of the face, lips, tongue, or throat  Kidney injury-decrease in the amount of urine, swelling of the ankles, hands, or feet  Low magnesium level-muscle pain or cramps, unusual weakness or fatigue, fast or irregular heartbeat, tremors  Low vitamin B12 level-pain, tingling, or numbness in the hands or feet, muscle weakness, dizziness, confusion, difficulty concentrating  Rash on the cheeks or ams that gets worse in the sun  Redness, blistering, peeling, or loosening of the skin, including inside the mouth  Severe diarrhea, fever  Unusual bruising or bleeding

## 2021-11-19 NOTE — Telephone Encounter (Signed)
Called and spoke with patient. She stated she found her Dulera at home but it is expired. She wanted to have [facility]. I advised her that Joellen Jersey went ahead and send in the RX. She will call the pharmacy to see if the RX is ready for pick up.   While on the phone, she mentioned that the Pepcid is not doing anything for her. She wanted to know if there was something stronger she could have.   Joellen Jersey, can you please advise on the Pepcid? Thanks!

## 2021-11-19 NOTE — Telephone Encounter (Signed)
Called and spoke with patient. She verbalized understanding. RX has been sent to her pharmacy as well as the [facility].   Nothing further needed at time of call.

## 2021-11-21 DIAGNOSIS — M5412 Radiculopathy, cervical region: Secondary | ICD-10-CM | POA: Diagnosis not present

## 2021-11-21 DIAGNOSIS — M67411 Ganglion, right shoulder: Secondary | ICD-10-CM | POA: Diagnosis not present

## 2021-11-21 DIAGNOSIS — M19011 Primary osteoarthritis, right shoulder: Secondary | ICD-10-CM | POA: Diagnosis not present

## 2021-12-02 ENCOUNTER — Telehealth: Payer: Self-pay

## 2021-12-02 NOTE — Telephone Encounter (Signed)
MEDICATION: Hydrocodone Cough Syrup   PHARMACY: [id number] [place], [place] - [address]  Comments: States she was recently prescribed this and when she went to a funeral out of town noticed it was missing. Wants another refill.   **Let patient know to contact pharmacy at the end of the day to make sure medication is ready. **  ** Please notify patient to allow 48-72 hours to process**  **Encourage patient to contact the pharmacy for refills or they can [facility] through [facility]*

## 2021-12-03 NOTE — Telephone Encounter (Signed)
Patient would like for Dr. Gwynn Burly cma to give her a call.

## 2021-12-03 NOTE — Telephone Encounter (Signed)
Patient called again today and would like for the CMA to give her a call concerning her request for cough syrup

## 2021-12-10 MED ORDER — HYDROCODONE BIT-HOMATROP MBR 5-1.5 MG/5ML PO SOLN: 5.0000 mL | Freq: Four times a day (QID) | ORAL | 0 refills | Status: AC | PRN

## 2021-12-10 NOTE — Telephone Encounter (Signed)
Patients request for a refill for the cough syrup was because what she had was taken from her home. She is still wheezing and her body is sore.

## 2021-12-10 NOTE — Telephone Encounter (Signed)
Ok done erx 

## 2021-12-10 NOTE — Addendum Note (Signed)
Addended by: Biagio Borg on: 12/10/2021 04:36 PM   Modules accepted: Orders

## 2021-12-16 ENCOUNTER — Other Ambulatory Visit: Payer: Self-pay | Admitting: Orthopedic Surgery

## 2021-12-16 DIAGNOSIS — M542 Cervicalgia: Secondary | ICD-10-CM

## 2021-12-31 ENCOUNTER — Inpatient Hospital Stay: Admission: RE | Admit: 2021-12-31 | Payer: 59 | Source: Ambulatory Visit

## 2022-01-01 ENCOUNTER — Telehealth: Payer: Self-pay

## 2022-01-01 NOTE — Patient Outreach (Signed)
  Care Coordination   01/01/2022 Name: EMALINE KARNES MRN: [id number] DOB: 25-Jul-1960   Care Coordination Outreach Attempts:  An unsuccessful telephone outreach was attempted for a scheduled appointment today.  Follow Up Plan:  Additional outreach attempts will be made to offer the patient care coordination information and services.   Encounter Outcome:  No Answer  Care Coordination Interventions Activated:  No   Care Coordination Interventions:  No, not indicated    Thea Silversmith, RN, MSN, BSN, [place] Coordinator [phone]

## 2022-01-07 ENCOUNTER — Other Ambulatory Visit: Payer: Self-pay | Admitting: Internal Medicine

## 2022-01-14 ENCOUNTER — Telehealth: Payer: Self-pay

## 2022-01-14 NOTE — Patient Outreach (Signed)
  Care Coordination   01/14/2022 Name: [name] MRN: [id number] DOB: Oct 21, 1960   Care Coordination Outreach Attempts:  An unsuccessful telephone outreach was attempted today to offer the patient information about available care coordination services as a benefit of their health plan.   Follow Up Plan:  Additional outreach attempts will be made to offer the patient care coordination information and services.   Encounter Outcome:  No Answer   Care Coordination Interventions:  No, not indicated    Thea Silversmith, RN, MSN, BSN, [place] Coordinator [phone]

## 2022-01-20 ENCOUNTER — Telehealth: Payer: Self-pay | Admitting: *Deleted

## 2022-01-20 NOTE — Progress Notes (Signed)
  Care Coordination Note  01/20/2022 Name: [name] MRN: [id number] DOB: 1960-06-27  [name] is a 61 y.o. year old female who is a primary care patient of Biagio Borg, MD and is actively engaged with the care management team. I reached out to [name] by phone today to assist with re-scheduling a follow up visit with the RN Case Manager  Follow up plan: We have been unable to make contact with the patient for follow up.   3rd time rescheduling follow up   Julian Hy, [facility] Direct Dial: [phone]

## 2022-03-11 ENCOUNTER — Telehealth: Payer: Self-pay | Admitting: Internal Medicine

## 2022-03-11 NOTE — Telephone Encounter (Signed)
Patient called believes she has RSV, she has a lot of coughing, also notes she has asthma. She would like a cough syrup prescribed and a call back. Patient can be reached at [phone].

## 2022-03-12 MED ORDER — GUAIFENESIN-DM 100-10 MG/5ML PO SYRP: 5.0000 mL | ORAL_SOLUTION | ORAL | 0 refills | Status: DC | PRN

## 2022-03-12 MED ORDER — ALBUTEROL SULFATE HFA 108 (90 BASE) MCG/ACT IN AERS: INHALATION_SPRAY | RESPIRATORY_TRACT | 1 refills | Status: DC

## 2022-03-12 NOTE — Telephone Encounter (Signed)
Office visit declined due to having a co pay that she cannot afford. Well visit scheduled for 2/12

## 2022-03-12 NOTE — Telephone Encounter (Signed)
Ok I sent albuterol and cough med; would need OV for anything else, thanks

## 2022-03-13 ENCOUNTER — Ambulatory Visit: Payer: 59 | Admitting: Family Medicine

## 2022-03-13 NOTE — Telephone Encounter (Signed)
Informed patient via voicemail.  

## 2022-03-20 ENCOUNTER — Encounter (HOSPITAL_COMMUNITY): Payer: Self-pay | Admitting: *Deleted

## 2022-03-24 ENCOUNTER — Ambulatory Visit (INDEPENDENT_AMBULATORY_CARE_PROVIDER_SITE_OTHER): Payer: 59 | Admitting: Internal Medicine

## 2022-03-24 ENCOUNTER — Other Ambulatory Visit: Payer: Self-pay | Admitting: Internal Medicine

## 2022-03-24 ENCOUNTER — Ambulatory Visit (INDEPENDENT_AMBULATORY_CARE_PROVIDER_SITE_OTHER): Payer: 59

## 2022-03-24 VITALS — BP 130/76 | HR 92 | Temp 98.2°F | Ht 65.0 in | Wt 294.0 lb

## 2022-03-24 DIAGNOSIS — R7302 Impaired glucose tolerance (oral): Secondary | ICD-10-CM | POA: Diagnosis not present

## 2022-03-24 DIAGNOSIS — E7800 Pure hypercholesterolemia, unspecified: Secondary | ICD-10-CM | POA: Diagnosis not present

## 2022-03-24 DIAGNOSIS — E559 Vitamin D deficiency, unspecified: Secondary | ICD-10-CM | POA: Diagnosis not present

## 2022-03-24 DIAGNOSIS — J309 Allergic rhinitis, unspecified: Secondary | ICD-10-CM

## 2022-03-24 DIAGNOSIS — E538 Deficiency of other specified B group vitamins: Secondary | ICD-10-CM | POA: Diagnosis not present

## 2022-03-24 DIAGNOSIS — R059 Cough, unspecified: Secondary | ICD-10-CM

## 2022-03-24 DIAGNOSIS — I10 Essential (primary) hypertension: Secondary | ICD-10-CM

## 2022-03-24 DIAGNOSIS — Z0001 Encounter for general adult medical examination with abnormal findings: Secondary | ICD-10-CM | POA: Diagnosis not present

## 2022-03-24 DIAGNOSIS — Z86010 Personal history of colonic polyps: Secondary | ICD-10-CM | POA: Diagnosis not present

## 2022-03-24 LAB — HEPATIC FUNCTION PANEL
ALT: 29 U/L (ref 0–35)
AST: 25 U/L (ref 0–37)
Albumin: 3.6 g/dL (ref 3.5–5.2)
Alkaline Phosphatase: 107 U/L (ref 39–117)
Bilirubin, Direct: 0.1 mg/dL (ref 0.0–0.3)
Total Bilirubin: 0.3 mg/dL (ref 0.2–1.2)
Total Protein: 7.1 g/dL (ref 6.0–8.3)

## 2022-03-24 LAB — MICROALBUMIN / CREATININE URINE RATIO
Creatinine,U: 92.3 mg/dL
Microalb Creat Ratio: 0.8 mg/g (ref 0.0–30.0)
Microalb, Ur: <0.7 mg/dL (ref 0.0–1.9)

## 2022-03-24 LAB — CBC WITH DIFFERENTIAL/PLATELET
Basophils Absolute: 0.1 10*3/uL (ref 0.0–0.1)
Basophils Relative: 1 % (ref 0.0–3.0)
Eosinophils Absolute: 0.4 10*3/uL (ref 0.0–0.7)
Eosinophils Relative: 2.4 % (ref 0.0–5.0)
HCT: 38.4 % (ref 36.0–46.0)
Hemoglobin: 12.4 g/dL (ref 12.0–15.0)
Lymphocytes Relative: 12 % (ref 12.0–46.0)
Lymphs Abs: 1.8 10*3/uL (ref 0.7–4.0)
MCHC: 32.4 g/dL (ref 30.0–36.0)
MCV: 92.7 fl (ref 78.0–100.0)
Monocytes Absolute: 0.8 10*3/uL (ref 0.1–1.0)
Monocytes Relative: 5.3 % (ref 3.0–12.0)
Neutro Abs: 11.7 10*3/uL — ABNORMAL HIGH (ref 1.4–7.7)
Neutrophils Relative %: 79.3 % — ABNORMAL HIGH (ref 43.0–77.0)
Platelets: 325 10*3/uL (ref 150.0–400.0)
RBC: 4.15 Mil/uL (ref 3.87–5.11)
RDW: 15.1 % (ref 11.5–15.5)
WBC: 14.8 10*3/uL — ABNORMAL HIGH (ref 4.0–10.5)

## 2022-03-24 LAB — URINALYSIS, ROUTINE W REFLEX MICROSCOPIC
Bilirubin Urine: NEGATIVE
Ketones, ur: NEGATIVE
Nitrite: NEGATIVE
RBC / HPF: NONE SEEN (ref 0–?)
Specific Gravity, Urine: 1.015 (ref 1.000–1.030)
Total Protein, Urine: NEGATIVE
Urine Glucose: NEGATIVE
Urobilinogen, UA: 0.2 (ref 0.0–1.0)
pH: 6 (ref 5.0–8.0)

## 2022-03-24 LAB — BASIC METABOLIC PANEL
BUN: 21 mg/dL (ref 6–23)
CO2: 30 mEq/L (ref 19–32)
Calcium: 9.6 mg/dL (ref 8.4–10.5)
Chloride: 102 mEq/L (ref 96–112)
Creatinine, Ser: 1.34 mg/dL — ABNORMAL HIGH (ref 0.40–1.20)
GFR: 42.95 mL/min — ABNORMAL LOW (ref >60.00)
Glucose, Bld: 89 mg/dL (ref 70–99)
Potassium: 3.8 mEq/L (ref 3.5–5.1)
Sodium: 142 mEq/L (ref 135–145)

## 2022-03-24 LAB — LIPID PANEL
Cholesterol: 165 mg/dL (ref 0–200)
HDL: 51 mg/dL (ref >39.00)
LDL Cholesterol: 92 mg/dL (ref 0–99)
NonHDL: 114.48
Total CHOL/HDL Ratio: 3
Triglycerides: 110 mg/dL (ref 0.0–149.0)
VLDL: 22 mg/dL (ref 0.0–40.0)

## 2022-03-24 LAB — TSH: TSH: 2.11 u[IU]/mL (ref 0.35–5.50)

## 2022-03-24 LAB — VITAMIN D 25 HYDROXY (VIT D DEFICIENCY, FRACTURES): VITD: 8.37 ng/mL — ABNORMAL LOW (ref 30.00–100.00)

## 2022-03-24 LAB — VITAMIN B12: Vitamin B-12: 374 pg/mL (ref 211–911)

## 2022-03-24 LAB — HEMOGLOBIN A1C: Hgb A1c MFr Bld: 6.1 % (ref 4.6–6.5)

## 2022-03-24 MED ORDER — HYDROCODONE BIT-HOMATROP MBR 5-1.5 MG/5ML PO SOLN: 5.0000 mL | Freq: Four times a day (QID) | ORAL | 0 refills | Status: AC | PRN

## 2022-03-24 NOTE — Patient Instructions (Addendum)
Please have your Shingrix (shingles) shots done at your local pharmacy.  Please take all new medication as prescribed - the cough medicine  You can also take Delsym OTC for cough, and/or Mucinex (or it's generic off brand) for congestion, and tylenol as needed for pain.  Please also try OTC Nasacort for allergies and possible post nasal drip  Please continue all other medications as before, and refills have been done if requested.  Please have the pharmacy call with any other refills you may need.  Please continue your efforts at being more active, low cholesterol diet, and weight control.  You are otherwise up to date with prevention measures today.  Please keep your appointments with your specialists as you may have planned  Please go to the XRAY Department in the first floor for the x-ray testing  Please go to the LAB at the blood drawing area for the tests to be done  You will be contacted by phone if any changes need to be made immediately.  Otherwise, you will receive a letter about your results with an explanation, but please check with MyChart first.  Please remember to sign up for MyChart if you have not done so, as this will be important to you in the future with finding out test results, communicating by private email, and scheduling acute appointments online when needed.  Please make an Appointment to return in 6 months, or sooner if needed

## 2022-03-24 NOTE — Progress Notes (Unsigned)
Patient ID: [name], female   DOB: [birth year], 62 y.o.   MRN: [id number]         Chief Complaint:: wellness exam and constant clearing of throat, allergies, htn, hld, hyperglycemia, low vit d       HPI:  [name] is a 62 y.o. female here for wellness exam; declines covid booster, flu shot, shingrix, but for colonscopy f/u with Dr young.  O/w up to date                       Also Does have several wks ongoing nasal allergy symptoms with clearish congestion, itch and sneezing, without fever, pain, ST, swelling or wheezing, but has persistent AM cough with having to clear the throat multiple times.  Pt denies chest pain, increased sob or doe, wheezing, orthopnea, PND, increased LE swelling, palpitations, dizziness or syncope.   Pt denies polydipsia, polyuria, or new focal neuro s/s.    Pt denies fever, wt loss, night sweats, loss of appetite, or other constitutional symptoms     Wt Readings from Last 3 Encounters:  03/24/22 294 lb (133.4 kg)  11/13/21 290 lb 9.6 oz (131.8 kg)  11/06/21 287 lb (130.2 kg)   BP Readings from Last 3 Encounters:  03/24/22 130/76  11/13/21 132/82  11/06/21 (!) 144/90   Immunization History  Administered Date(s) Administered   PFIZER(Purple Top)SARS-COV-2 Vaccination 04/25/2019, 05/18/2019, 01/02/2020   Td 02/10/2002  There are no preventive care reminders to display for this patient.    Past Medical History:  Diagnosis Date   Abscess of bladder 07/28/2016   Alcohol dependence ([place])    Allergic rhinitis 12/06/2013   Anxiety    Asthma 03/16/2015   Atrial flutter with rapid ventricular response ([place]) 04/01/2020   COLONIC POLYPS, HX OF 04/05/2010   DIVERTICULITIS, HX OF 04/05/2010   DJD (degenerative joint disease)    right knee, mot to severe   Dyspnea    Dysrhythmia    GERD (gastroesophageal reflux disease)    no meds   Heart murmur    hx of    History of kidney stones    Hyperlipidemia    Hypertension    Impaired glucose tolerance  12/06/2013   Morbid obesity with BMI of 50.0-59.9, adult ([place])    Peripheral vascular disease ([place])    Pneumonia    Pulmonary embolism ([place])    Past Surgical History:  Procedure Laterality Date   ABDOMINAL HYSTERECTOMY  age 14   fibroids   BREAST BIOPSY Left    COLONOSCOPY WITH PROPOFOL N/A 07/25/2016   Procedure: COLONOSCOPY WITH PROPOFOL;  Surgeon: Carol Ada, MD;  [facility];  Service: Endoscopy;  Laterality: N/A;   colonscopy     x 2   CYSTOSCOPY W/ URETERAL STENT PLACEMENT Right 09/05/2021   Procedure: CYSTOSCOPY WITH RETROGRADE PYELOGRAM/URETERAL STENT PLACEMENT;  Surgeon: Janith Lima, MD;  Location: [facility];  Service: Urology;  Laterality: Right;   CYSTOSCOPY/URETEROSCOPY/HOLMIUM LASER/STENT PLACEMENT Right 09/30/2021   Procedure: CYSTOSCOPY/ RETROGRADE/URETEROSCOPY/HOLMIUM LASER/STENT PLACEMENT;  Surgeon: Janith Lima, MD;  [facility];  Service: Urology;  Laterality: Right;   IR RADIOLOGIST EVAL & MGMT  08/12/2016   IR RADIOLOGIST EVAL & MGMT  08/21/2016   KNEE ARTHROSCOPY     left    TOTAL KNEE ARTHROPLASTY  07/29/2011   Procedure: TOTAL KNEE ARTHROPLASTY;  Surgeon: Mauri Pole, MD;  [facility];  Service: Orthopedics;  Laterality: Right;   TOTAL KNEE ARTHROPLASTY  Left 12/14/2012   Procedure: LEFT TOTAL KNEE ARTHROPLASTY;  Surgeon: Mauri Pole, MD;  [facility];  Service: Orthopedics;  Laterality: Left;    reports that she quit smoking about 39 years ago. Her smoking use included cigarettes. She has a 3.50 pack-year smoking history. She has never used smokeless tobacco. She reports that she does not currently use alcohol. She reports that she does not currently use drugs after having used the following drugs: Marijuana. family history includes Alcoholism in her sister; COPD in her father; Lymphoma in her sister; Stroke in her mother. Allergies  Allergen Reactions   Morphine Itching   Shrimp [Shellfish Allergy] Itching and Other (See  Comments)    Tongue burns also   Tramadol Other (See Comments)    Caused confusion   Covid-19 (Mrna) Vaccine Hives   Diltiazem Hcl Itching    Pt with itching of the feet when [name] given   Other Other (See Comments)   Latex Rash   Current Outpatient Medications on File Prior to Visit  Medication Sig Dispense Refill   albuterol (VENTOLIN HFA) 108 (90 Base) MCG/ACT inhaler INHALE 2 PUFFS EVERY 6 HOURS AS NEEDED 8.5 each 1   atorvastatin (LIPITOR) 20 MG tablet TAKE 1 TABLET BY MOUTH EVERY DAY 90 tablet 3   Brimonidine Tartrate (LUMIFY) 0.025 % SOLN Place 1 drop into both eyes daily as needed (redness).     calcium carbonate (TUMS - DOSED IN MG ELEMENTAL CALCIUM) 500 MG chewable tablet Chew 1 tablet by mouth daily as needed for indigestion or heartburn.     ELIQUIS 5 MG TABS tablet Take 5 mg by mouth 2 (two) times daily.     famotidine (PEPCID) 20 MG tablet Take 1 tablet (20 mg total) by mouth 2 (two) times daily. 60 tablet 5   guaiFENesin-dextromethorphan (ROBITUSSIN DM) 100-10 MG/5ML syrup Take 5 mLs by mouth every 4 (four) hours as needed for cough. 118 mL 0   levocetirizine (XYZAL) 5 MG tablet Take 1 tablet (5 mg total) by mouth every evening. 30 tablet 5   losartan-hydrochlorothiazide (HYZAAR) 100-25 MG tablet Take 1 tablet by mouth daily.     methocarbamol (ROBAXIN) 500 MG tablet Take 1 tablet (500 mg total) by mouth 4 (four) times daily. 60 tablet 2   metoprolol succinate (TOPROL-XL) 25 MG 24 hr tablet Take 1 tablet (25 mg total) by mouth daily. 30 tablet 11   mometasone-formoterol (DULERA) 100-5 MCG/ACT AERO Inhale 2 puffs into the lungs in the morning and at bedtime. 1 each 5   Multiple Vitamin (MULTIVITAMIN WITH MINERALS) TABS tablet Take 1 tablet by mouth daily.     ondansetron (ZOFRAN-ODT) 4 MG disintegrating tablet TAKE 1 TABLET BY MOUTH EVERY 8 HOURS AS NEEDED 18 tablet 3   pantoprazole (PROTONIX) 40 MG tablet Take 1 tablet (40 mg total) by mouth daily. 30 tablet 2   rivaroxaban  (XARELTO) 20 MG TABS tablet Take 1 tablet (20 mg total) by mouth daily with supper. 30 tablet 3   thiamine (VITAMIN B1) 100 MG tablet Take 1 tablet (100 mg total) by mouth daily. 30 tablet 0   triamcinolone cream (KENALOG) 0.1 % Apply 1 Application topically 2 (two) times daily as needed (itching). 30 g 2   No current facility-administered medications on file prior to visit.        ROS:  All others reviewed and negative.  Objective        PE:  BP 130/76 (BP Location: Left Arm, Patient Position:  Sitting, Cuff Size: Large)   Pulse 92   Temp 98.2 F (36.8 C) (Oral)   Ht 5' 5"$  (1.651 m)   Wt 294 lb (133.4 kg)   SpO2 97%   BMI 48.92 kg/m                 Constitutional: Pt appears in NAD               HENT: Head: NCAT.                Right Ear: External ear normal.                 Left Ear: External ear normal. Bilat tm's with mild erythema.  Max sinus areas non tender.  Pharynx with mild erythema, no exudate               Eyes: . Pupils are equal, round, and reactive to light. Conjunctivae and EOM are normal               Nose: without d/c or deformity               Neck: Neck supple. Gross normal ROM               Cardiovascular: Normal rate and regular rhythm.                 Pulmonary/Chest: Effort normal and breath sounds without rales or wheezing.                Abd:  Soft, NT, ND, + BS, no organomegaly               Neurological: Pt is alert. At baseline orientation, motor grossly intact               Skin: Skin is warm. No rashes, no other new lesions, LE edema - none               Psychiatric: Pt behavior is normal without agitation   Micro: none  Cardiac tracings I have personally interpreted today:  none  Pertinent Radiological findings (summarize): none   Lab Results  Component Value Date   WBC 14.8 (H) 03/24/2022   HGB 12.4 03/24/2022   HCT 38.4 03/24/2022   PLT 325.0 03/24/2022   GLUCOSE 89 03/24/2022   CHOL 165 03/24/2022   TRIG 110.0 03/24/2022   HDL 51.00  03/24/2022   LDLDIRECT 107.0 02/23/2012   LDLCALC 92 03/24/2022   ALT 29 03/24/2022   AST 25 03/24/2022   NA 142 03/24/2022   K 3.8 03/24/2022   CL 102 03/24/2022   CREATININE 1.34 (H) 03/24/2022   BUN 21 03/24/2022   CO2 30 03/24/2022   TSH 2.11 03/24/2022   INR 1.11 07/28/2016   HGBA1C 6.1 03/24/2022   MICROALBUR <0.7 03/24/2022   Assessment/Plan:  WALDEAN FRITZLER is a 62 y.o. Black or African American [2] female with  has a past medical history of Abscess of bladder (07/28/2016), Alcohol dependence ([place]), Allergic rhinitis (12/06/2013), Anxiety, Asthma (03/16/2015), Atrial flutter with rapid ventricular response ([place]) (04/01/2020), COLONIC POLYPS, HX OF (04/05/2010), DIVERTICULITIS, HX OF (04/05/2010), DJD (degenerative joint disease), Dyspnea, Dysrhythmia, GERD (gastroesophageal reflux disease), Heart murmur, History of kidney stones, Hyperlipidemia, Hypertension, Impaired glucose tolerance (12/06/2013), Morbid obesity with BMI of 50.0-59.9, adult ([place]), Peripheral vascular disease ([place]), Pneumonia, and Pulmonary embolism ([place]).  Encounter for well adult exam with abnormal findings Age and sex appropriate education and counseling updated with  regular exercise and diet Referrals for preventative services - for colonoscopy Immunizations addressed - none needed Smoking counseling  - none needed Evidence for depression or other mood disorder - none significant Most recent labs reviewed. I have personally reviewed and have noted: 1) the patient's medical and social history 2) The patient's current medications and supplements 3) The patient's height, weight, and BMI have been recorded in the chart   Cough I suspect this most likely due to post nasal gtt and phlegm to clear in the am, but for cxr as well ro underlying pulm disorder  Essential hypertension BP Readings from Last 3 Encounters:  03/24/22 130/76  11/13/21 132/82  11/06/21 (!) 144/90   Stable, pt to continue medical  treatment hyzaar 100 25 mg qd, toprol xl 25 qd   Hyperlipidemia Lab Results  Component Value Date   LDLCALC 92 03/24/2022   Stable, pt to continue current statin liptior 20 mg qd   Impaired glucose tolerance Lab Results  Component Value Date   HGBA1C 6.1 03/24/2022   Stable, pt to continue current medical treatment  - diet, wt control, excercsie   Vitamin D deficiency Last vitamin D Lab Results  Component Value Date   VD25OH 8.37 (L) 03/24/2022   Low, to start oral replacement   Allergic rhinitis With seasonal worsening, for add nasacort asd dialy  Followup: Return in about 6 months (around 09/22/2022).  Cathlean Cower, MD 03/27/2022 4:48 AM [facility]

## 2022-03-27 ENCOUNTER — Encounter: Payer: Self-pay | Admitting: Internal Medicine

## 2022-03-27 NOTE — Assessment & Plan Note (Signed)
Last vitamin D Lab Results  Component Value Date   VD25OH 8.37 (L) 03/24/2022   Low, to start oral replacement

## 2022-03-27 NOTE — Assessment & Plan Note (Signed)
BP Readings from Last 3 Encounters:  03/24/22 130/76  11/13/21 132/82  11/06/21 (!) 144/90   Stable, pt to continue medical treatment hyzaar 100 25 mg qd, toprol xl 25 qd

## 2022-03-27 NOTE — Assessment & Plan Note (Signed)
With seasonal worsening, for add nasacort asd dialy

## 2022-03-27 NOTE — Assessment & Plan Note (Signed)
Lab Results  Component Value Date   LDLCALC 92 03/24/2022   Stable, pt to continue current statin liptior 20 mg qd

## 2022-03-27 NOTE — Assessment & Plan Note (Signed)
I suspect this most likely due to post nasal gtt and phlegm to clear in the am, but for cxr as well ro underlying pulm disorder

## 2022-03-27 NOTE — Assessment & Plan Note (Signed)

## 2022-03-27 NOTE — Assessment & Plan Note (Signed)
Lab Results  Component Value Date   HGBA1C 6.1 03/24/2022   Stable, pt to continue current medical treatment  - diet, wt control, excercsie

## 2022-04-10 ENCOUNTER — Other Ambulatory Visit: Payer: Self-pay | Admitting: Internal Medicine

## 2022-05-06 DIAGNOSIS — M1990 Unspecified osteoarthritis, unspecified site: Secondary | ICD-10-CM | POA: Diagnosis not present

## 2022-05-06 DIAGNOSIS — Z008 Encounter for other general examination: Secondary | ICD-10-CM | POA: Diagnosis not present

## 2022-05-06 DIAGNOSIS — D6869 Other thrombophilia: Secondary | ICD-10-CM | POA: Diagnosis not present

## 2022-05-06 DIAGNOSIS — K219 Gastro-esophageal reflux disease without esophagitis: Secondary | ICD-10-CM | POA: Diagnosis not present

## 2022-05-06 DIAGNOSIS — R32 Unspecified urinary incontinence: Secondary | ICD-10-CM | POA: Diagnosis not present

## 2022-05-06 DIAGNOSIS — I4891 Unspecified atrial fibrillation: Secondary | ICD-10-CM | POA: Diagnosis not present

## 2022-05-06 DIAGNOSIS — E785 Hyperlipidemia, unspecified: Secondary | ICD-10-CM | POA: Diagnosis not present

## 2022-05-06 DIAGNOSIS — Z8249 Family history of ischemic heart disease and other diseases of the circulatory system: Secondary | ICD-10-CM | POA: Diagnosis not present

## 2022-05-06 DIAGNOSIS — Z87891 Personal history of nicotine dependence: Secondary | ICD-10-CM | POA: Diagnosis not present

## 2022-05-06 DIAGNOSIS — J45909 Unspecified asthma, uncomplicated: Secondary | ICD-10-CM | POA: Diagnosis not present

## 2022-05-06 DIAGNOSIS — F419 Anxiety disorder, unspecified: Secondary | ICD-10-CM | POA: Diagnosis not present

## 2022-05-06 DIAGNOSIS — I10 Essential (primary) hypertension: Secondary | ICD-10-CM | POA: Diagnosis not present

## 2022-05-06 DIAGNOSIS — Z7901 Long term (current) use of anticoagulants: Secondary | ICD-10-CM | POA: Diagnosis not present

## 2022-05-08 ENCOUNTER — Other Ambulatory Visit (HOSPITAL_COMMUNITY): Payer: Self-pay | Admitting: Nurse Practitioner

## 2022-05-12 DIAGNOSIS — Z419 Encounter for procedure for purposes other than remedying health state, unspecified: Secondary | ICD-10-CM | POA: Diagnosis not present

## 2022-05-24 ENCOUNTER — Encounter (HOSPITAL_COMMUNITY): Payer: Self-pay | Admitting: Emergency Medicine

## 2022-05-24 ENCOUNTER — Other Ambulatory Visit: Payer: Self-pay

## 2022-05-24 ENCOUNTER — Inpatient Hospital Stay (HOSPITAL_COMMUNITY)
Admission: EM | Admit: 2022-05-24 | Discharge: 2022-05-29 | DRG: 897 | Disposition: A | Discharge status: Home / Self Care | Payer: 59 | Attending: Internal Medicine | Admitting: Internal Medicine

## 2022-05-24 DIAGNOSIS — E1151 Type 2 diabetes mellitus with diabetic peripheral angiopathy without gangrene: Secondary | ICD-10-CM | POA: Diagnosis not present

## 2022-05-24 DIAGNOSIS — E785 Hyperlipidemia, unspecified: Secondary | ICD-10-CM | POA: Diagnosis not present

## 2022-05-24 DIAGNOSIS — R7303 Prediabetes: Secondary | ICD-10-CM | POA: Insufficient documentation

## 2022-05-24 DIAGNOSIS — Z887 Allergy status to serum and vaccine status: Secondary | ICD-10-CM

## 2022-05-24 DIAGNOSIS — K219 Gastro-esophageal reflux disease without esophagitis: Secondary | ICD-10-CM | POA: Diagnosis present

## 2022-05-24 DIAGNOSIS — F419 Anxiety disorder, unspecified: Secondary | ICD-10-CM | POA: Diagnosis present

## 2022-05-24 DIAGNOSIS — Z888 Allergy status to other drugs, medicaments and biological substances status: Secondary | ICD-10-CM | POA: Diagnosis not present

## 2022-05-24 DIAGNOSIS — Z86711 Personal history of pulmonary embolism: Secondary | ICD-10-CM | POA: Diagnosis not present

## 2022-05-24 DIAGNOSIS — F10932 Alcohol use, unspecified with withdrawal with perceptual disturbance: Secondary | ICD-10-CM | POA: Diagnosis present

## 2022-05-24 DIAGNOSIS — E860 Dehydration: Secondary | ICD-10-CM | POA: Diagnosis present

## 2022-05-24 DIAGNOSIS — R7401 Elevation of levels of liver transaminase levels: Secondary | ICD-10-CM | POA: Diagnosis not present

## 2022-05-24 DIAGNOSIS — J309 Allergic rhinitis, unspecified: Secondary | ICD-10-CM | POA: Diagnosis present

## 2022-05-24 DIAGNOSIS — Y906 Blood alcohol level of 120-199 mg/100 ml: Secondary | ICD-10-CM | POA: Diagnosis not present

## 2022-05-24 DIAGNOSIS — Z8709 Personal history of other diseases of the respiratory system: Secondary | ICD-10-CM

## 2022-05-24 DIAGNOSIS — Z79899 Other long term (current) drug therapy: Secondary | ICD-10-CM

## 2022-05-24 DIAGNOSIS — Z87891 Personal history of nicotine dependence: Secondary | ICD-10-CM | POA: Diagnosis not present

## 2022-05-24 DIAGNOSIS — J4520 Mild intermittent asthma, uncomplicated: Secondary | ICD-10-CM

## 2022-05-24 DIAGNOSIS — Z91013 Allergy to seafood: Secondary | ICD-10-CM | POA: Diagnosis not present

## 2022-05-24 DIAGNOSIS — Z96653 Presence of artificial knee joint, bilateral: Secondary | ICD-10-CM | POA: Diagnosis present

## 2022-05-24 DIAGNOSIS — E872 Acidosis, unspecified: Secondary | ICD-10-CM | POA: Diagnosis present

## 2022-05-24 DIAGNOSIS — Z91040 Latex allergy status: Secondary | ICD-10-CM

## 2022-05-24 DIAGNOSIS — G4733 Obstructive sleep apnea (adult) (pediatric): Secondary | ICD-10-CM | POA: Diagnosis not present

## 2022-05-24 DIAGNOSIS — Z7901 Long term (current) use of anticoagulants: Secondary | ICD-10-CM

## 2022-05-24 DIAGNOSIS — E876 Hypokalemia: Secondary | ICD-10-CM | POA: Diagnosis not present

## 2022-05-24 DIAGNOSIS — Z90710 Acquired absence of both cervix and uterus: Secondary | ICD-10-CM

## 2022-05-24 DIAGNOSIS — R109 Unspecified abdominal pain: Secondary | ICD-10-CM

## 2022-05-24 DIAGNOSIS — Z6841 Body Mass Index (BMI) 40.0 and over, adult: Secondary | ICD-10-CM

## 2022-05-24 DIAGNOSIS — F10232 Alcohol dependence with withdrawal with perceptual disturbance: Secondary | ICD-10-CM | POA: Diagnosis not present

## 2022-05-24 DIAGNOSIS — I480 Paroxysmal atrial fibrillation: Secondary | ICD-10-CM | POA: Diagnosis not present

## 2022-05-24 DIAGNOSIS — Z7951 Long term (current) use of inhaled steroids: Secondary | ICD-10-CM

## 2022-05-24 DIAGNOSIS — R112 Nausea with vomiting, unspecified: Secondary | ICD-10-CM | POA: Diagnosis present

## 2022-05-24 DIAGNOSIS — Z885 Allergy status to narcotic agent status: Secondary | ICD-10-CM | POA: Diagnosis not present

## 2022-05-24 DIAGNOSIS — I10 Essential (primary) hypertension: Secondary | ICD-10-CM | POA: Diagnosis not present

## 2022-05-24 DIAGNOSIS — Z811 Family history of alcohol abuse and dependence: Secondary | ICD-10-CM

## 2022-05-24 DIAGNOSIS — R000 Tachycardia, unspecified: Secondary | ICD-10-CM | POA: Diagnosis not present

## 2022-05-24 DIAGNOSIS — Z960 Presence of urogenital implants: Secondary | ICD-10-CM | POA: Diagnosis present

## 2022-05-24 DIAGNOSIS — K7010 Alcoholic hepatitis without ascites: Secondary | ICD-10-CM | POA: Diagnosis not present

## 2022-05-24 DIAGNOSIS — Z713 Dietary counseling and surveillance: Secondary | ICD-10-CM

## 2022-05-24 DIAGNOSIS — Z7141 Alcohol abuse counseling and surveillance of alcoholic: Secondary | ICD-10-CM

## 2022-05-24 HISTORY — DX: Nausea with vomiting, unspecified: R11.2

## 2022-05-24 LAB — CBC
HCT: 44.2 % (ref 36.0–46.0)
Hemoglobin: 14.2 g/dL (ref 12.0–15.0)
MCH: 30.4 pg (ref 26.0–34.0)
MCHC: 32.1 g/dL (ref 30.0–36.0)
MCV: 94.6 fL (ref 80.0–100.0)
Platelets: 300 10*3/uL (ref 150–400)
RBC: 4.67 MIL/uL (ref 3.87–5.11)
RDW: 14.1 % (ref 11.5–15.5)
WBC: 10.4 10*3/uL (ref 4.0–10.5)
nRBC: 0 % (ref 0.0–0.2)

## 2022-05-24 LAB — COMPREHENSIVE METABOLIC PANEL
ALT: 39 U/L (ref 0–44)
AST: 56 U/L — ABNORMAL HIGH (ref 15–41)
Albumin: 3.5 g/dL (ref 3.5–5.0)
Alkaline Phosphatase: 108 U/L (ref 38–126)
Anion gap: 17 — ABNORMAL HIGH (ref 5–15)
BUN: 19 mg/dL (ref 8–23)
CO2: 19 mmol/L — ABNORMAL LOW (ref 22–32)
Calcium: 9.1 mg/dL (ref 8.9–10.3)
Chloride: 104 mmol/L (ref 98–111)
Creatinine, Ser: 0.98 mg/dL (ref 0.44–1.00)
GFR, Estimated: >60 mL/min (ref >60)
Glucose, Bld: 108 mg/dL — ABNORMAL HIGH (ref 70–99)
Potassium: 3.8 mmol/L (ref 3.5–5.1)
Sodium: 140 mmol/L (ref 135–145)
Total Bilirubin: 0.8 mg/dL (ref 0.3–1.2)
Total Protein: 7.7 g/dL (ref 6.5–8.1)

## 2022-05-24 LAB — RAPID URINE DRUG SCREEN, HOSP PERFORMED
Amphetamines: NOT DETECTED
Barbiturates: NOT DETECTED
Benzodiazepines: NOT DETECTED
Cocaine: NOT DETECTED
Opiates: NOT DETECTED
Tetrahydrocannabinol: NOT DETECTED

## 2022-05-24 LAB — MAGNESIUM: Magnesium: 1.6 mg/dL — ABNORMAL LOW (ref 1.7–2.4)

## 2022-05-24 LAB — LIPASE, BLOOD: Lipase: 27 U/L (ref 11–51)

## 2022-05-24 LAB — ETHANOL: Alcohol, Ethyl (B): 130 mg/dL — ABNORMAL HIGH (ref <10)

## 2022-05-24 MED ORDER — PHENOBARBITAL SODIUM 130 MG/ML IJ SOLN
65.0000 mg | Freq: Three times a day (TID) | INTRAMUSCULAR | Status: AC
  Administered 2022-05-26 – 2022-05-28 (×6): 65 mg via INTRAVENOUS
  Filled 2022-05-24 (×6): qty 1

## 2022-05-24 MED ORDER — METOPROLOL SUCCINATE ER 25 MG PO TB24
25.0000 mg | ORAL_TABLET | Freq: Every day | ORAL | Status: DC
  Filled 2022-05-24: qty 1

## 2022-05-24 MED ORDER — METOPROLOL TARTRATE 5 MG/5ML IV SOLN
2.5000 mg | Freq: Four times a day (QID) | INTRAVENOUS | Status: DC
  Administered 2022-05-24 – 2022-05-25 (×3): 2.5 mg via INTRAVENOUS
  Filled 2022-05-24 (×3): qty 5

## 2022-05-24 MED ORDER — ACETAMINOPHEN 500 MG PO TABS
1000.0000 mg | ORAL_TABLET | Freq: Once | ORAL | Status: AC
  Administered 2022-05-24: 1000 mg via ORAL
  Filled 2022-05-24: qty 2

## 2022-05-24 MED ORDER — LACTATED RINGERS IV BOLUS
1000.0000 mL | Freq: Once | INTRAVENOUS | Status: AC
  Administered 2022-05-24: 1000 mL via INTRAVENOUS

## 2022-05-24 MED ORDER — ACETAMINOPHEN 650 MG RE SUPP: 650.0000 mg | Freq: Four times a day (QID) | RECTAL | Status: DC | PRN

## 2022-05-24 MED ORDER — FOLIC ACID 1 MG PO TABS
1.0000 mg | ORAL_TABLET | Freq: Every day | ORAL | Status: DC
  Administered 2022-05-24 – 2022-05-29 (×6): 1 mg via ORAL
  Filled 2022-05-24 (×6): qty 1

## 2022-05-24 MED ORDER — SODIUM CHLORIDE 0.9% FLUSH
3.0000 mL | Freq: Two times a day (BID) | INTRAVENOUS | Status: DC
  Administered 2022-05-24 – 2022-05-29 (×10): 3 mL via INTRAVENOUS

## 2022-05-24 MED ORDER — LORAZEPAM 2 MG/ML IJ SOLN
0.0000 mg | Freq: Four times a day (QID) | INTRAMUSCULAR | Status: DC
  Filled 2022-05-24: qty 2

## 2022-05-24 MED ORDER — CHLORDIAZEPOXIDE HCL 25 MG PO CAPS
50.0000 mg | ORAL_CAPSULE | Freq: Once | ORAL | Status: AC
  Administered 2022-05-24: 50 mg via ORAL
  Filled 2022-05-24: qty 2

## 2022-05-24 MED ORDER — LOSARTAN POTASSIUM 50 MG PO TABS
100.0000 mg | ORAL_TABLET | Freq: Every day | ORAL | Status: DC
  Administered 2022-05-25 – 2022-05-29 (×5): 100 mg via ORAL
  Filled 2022-05-24 (×5): qty 2

## 2022-05-24 MED ORDER — ADULT MULTIVITAMIN W/MINERALS CH
1.0000 | ORAL_TABLET | Freq: Every day | ORAL | Status: DC
  Administered 2022-05-24 – 2022-05-29 (×6): 1 via ORAL
  Filled 2022-05-24 (×6): qty 1

## 2022-05-24 MED ORDER — SODIUM CHLORIDE 0.9 % IV SOLN: INTRAVENOUS | Status: AC

## 2022-05-24 MED ORDER — LORAZEPAM 2 MG/ML IJ SOLN: 0.0000 mg | Freq: Two times a day (BID) | INTRAMUSCULAR | Status: DC

## 2022-05-24 MED ORDER — CHLORDIAZEPOXIDE HCL 25 MG PO CAPS: 25.0000 mg | ORAL_CAPSULE | ORAL | Status: DC

## 2022-05-24 MED ORDER — ALBUTEROL SULFATE (2.5 MG/3ML) 0.083% IN NEBU: 2.5000 mg | INHALATION_SOLUTION | Freq: Four times a day (QID) | RESPIRATORY_TRACT | Status: DC | PRN

## 2022-05-24 MED ORDER — LORAZEPAM 2 MG/ML IJ SOLN
1.0000 mg | Freq: Once | INTRAMUSCULAR | Status: AC
  Administered 2022-05-24: 1 mg via INTRAVENOUS
  Filled 2022-05-24: qty 1

## 2022-05-24 MED ORDER — ONDANSETRON HCL 4 MG/2ML IJ SOLN: 4.0000 mg | Freq: Four times a day (QID) | INTRAMUSCULAR | Status: DC | PRN

## 2022-05-24 MED ORDER — LORAZEPAM 2 MG/ML IJ SOLN
1.0000 mg | INTRAMUSCULAR | Status: DC | PRN
  Administered 2022-05-25 – 2022-05-29 (×8): 1 mg via INTRAVENOUS
  Filled 2022-05-24 (×6): qty 1

## 2022-05-24 MED ORDER — LORAZEPAM 2 MG/ML IJ SOLN
2.0000 mg | Freq: Once | INTRAMUSCULAR | Status: AC
  Administered 2022-05-24: 2 mg via INTRAVENOUS

## 2022-05-24 MED ORDER — LORAZEPAM 1 MG PO TABS
1.0000 mg | ORAL_TABLET | ORAL | Status: AC | PRN
  Administered 2022-05-24: 4 mg via ORAL
  Administered 2022-05-25 – 2022-05-27 (×3): 1 mg via ORAL
  Filled 2022-05-24: qty 1
  Filled 2022-05-24: qty 4
  Filled 2022-05-24 (×2): qty 1

## 2022-05-24 MED ORDER — CHLORDIAZEPOXIDE HCL 25 MG PO CAPS: 25.0000 mg | ORAL_CAPSULE | Freq: Four times a day (QID) | ORAL | Status: DC

## 2022-05-24 MED ORDER — CHLORDIAZEPOXIDE HCL 25 MG PO CAPS: 25.0000 mg | ORAL_CAPSULE | Freq: Every day | ORAL | Status: DC

## 2022-05-24 MED ORDER — THIAMINE HCL 100 MG/ML IJ SOLN
100.0000 mg | Freq: Every day | INTRAMUSCULAR | Status: DC
  Administered 2022-05-24: 100 mg via INTRAVENOUS
  Filled 2022-05-24: qty 2

## 2022-05-24 MED ORDER — RINGERS IV SOLN: INTRAVENOUS | Status: DC

## 2022-05-24 MED ORDER — ACETAMINOPHEN 325 MG PO TABS
650.0000 mg | ORAL_TABLET | Freq: Four times a day (QID) | ORAL | Status: DC | PRN
  Administered 2022-05-25: 650 mg via ORAL
  Filled 2022-05-24 (×2): qty 2

## 2022-05-24 MED ORDER — PHENOBARBITAL SODIUM 130 MG/ML IJ SOLN
97.5000 mg | Freq: Three times a day (TID) | INTRAMUSCULAR | Status: AC
  Administered 2022-05-24 – 2022-05-26 (×6): 97.5 mg via INTRAVENOUS
  Filled 2022-05-24 (×6): qty 1

## 2022-05-24 MED ORDER — CHLORDIAZEPOXIDE HCL 25 MG PO CAPS: 25.0000 mg | ORAL_CAPSULE | Freq: Four times a day (QID) | ORAL | Status: AC | PRN

## 2022-05-24 MED ORDER — LACTATED RINGERS IV SOLN: INTRAVENOUS | Status: DC

## 2022-05-24 MED ORDER — CHLORDIAZEPOXIDE HCL 25 MG PO CAPS: 25.0000 mg | ORAL_CAPSULE | Freq: Three times a day (TID) | ORAL | Status: DC

## 2022-05-24 MED ORDER — PHENOBARBITAL SODIUM 65 MG/ML IJ SOLN
32.5000 mg | Freq: Three times a day (TID) | INTRAMUSCULAR | Status: DC
  Administered 2022-05-28 – 2022-05-29 (×4): 32.5 mg via INTRAVENOUS
  Filled 2022-05-24 (×4): qty 1

## 2022-05-24 MED ORDER — PANTOPRAZOLE SODIUM 40 MG IV SOLR
40.0000 mg | Freq: Once | INTRAVENOUS | Status: AC
  Administered 2022-05-24: 40 mg via INTRAVENOUS
  Filled 2022-05-24: qty 10

## 2022-05-24 MED ORDER — THIAMINE MONONITRATE 100 MG PO TABS
100.0000 mg | ORAL_TABLET | Freq: Every day | ORAL | Status: DC
  Administered 2022-05-25 – 2022-05-29 (×5): 100 mg via ORAL
  Filled 2022-05-24 (×5): qty 1

## 2022-05-24 MED ORDER — LORAZEPAM 2 MG/ML IJ SOLN
INTRAMUSCULAR | Status: AC
  Filled 2022-05-24: qty 1

## 2022-05-24 MED ORDER — LORAZEPAM 2 MG/ML IJ SOLN
1.0000 mg | INTRAMUSCULAR | Status: AC | PRN
  Administered 2022-05-24 (×3): 4 mg via INTRAVENOUS
  Administered 2022-05-25: 2 mg via INTRAVENOUS
  Filled 2022-05-24: qty 2
  Filled 2022-05-24: qty 1
  Filled 2022-05-24: qty 2
  Filled 2022-05-24 (×2): qty 1

## 2022-05-24 MED ORDER — LOPERAMIDE HCL 2 MG PO CAPS: 2.0000 mg | ORAL_CAPSULE | ORAL | Status: AC | PRN

## 2022-05-24 MED ORDER — ONDANSETRON HCL 4 MG PO TABS: 4.0000 mg | ORAL_TABLET | Freq: Four times a day (QID) | ORAL | Status: DC | PRN

## 2022-05-24 NOTE — ED Notes (Signed)
ED TO INPATIENT HANDOFF REPORT  ED Nurse Name and Phone #: [phone]  S Name/Age/Gender [name] 62 y.o. female Room/Bed: 018C/018C  Code Status   Code Status: Full Code  Home/SNF/Other Home Patient oriented to: self, place, time, and situation Is this baseline? Yes   Triage Complete: Triage complete  Chief Complaint Alcohol withdrawal with perceptual disturbances [F10.932]  Triage Note Pt states she fell off the wagon drinking a fifth a day for last couple of weeks. Last drink was yesterday. Pt is not diaphoretic, no headache, no nausea.    Allergies Allergies  Allergen Reactions   Morphine Itching   Shrimp [Shellfish Allergy] Itching and Other (See Comments)    Tongue burns also   Tramadol Other (See Comments)    Caused confusion   Covid-19 (Mrna) Vaccine Hives   Diltiazem Hcl Itching    Pt with itching of the feet when bolus given   Other Other (See Comments)   Latex Rash    Level of Care/Admitting Diagnosis ED Disposition     ED Disposition  Admit   Condition  --   Comment  Hospital Area: [facility] [[id number]]  Level of Care: Progressive [102]  Admit to Progressive based on following criteria: ACUTE MENTAL DISORDER-RELATED Drug/Alcohol Ingestion/Overdose/Withdrawal, Suicidal Ideation/attempt requiring safety sitter and < Q2h monitoring/assessments, moderate to severe agitation that is managed with medication/sitter, CIWA-Ar score < 20.  May admit patient to [facility] or [facility] if equivalent level of care is available:: No  Covid Evaluation: Asymptomatic - no recent exposure (last 10 days) testing not required  Diagnosis: Alcohol withdrawal with perceptual disturbances [[id number]]  Admitting Physician: Clydie Braun [[id number]]  Attending Physician: Clydie Braun [[id number]]  Certification:: I certify this patient will need inpatient services for at least 2 midnights  Estimated Length of Stay: 3          B Medical/Surgery  History Past Medical History:  Diagnosis Date   Abscess of bladder 07/28/2016   Alcohol dependence    Allergic rhinitis 12/06/2013   Anxiety    Asthma 03/16/2015   Atrial flutter with rapid ventricular response 04/01/2020   COLONIC POLYPS, HX OF 04/05/2010   DIVERTICULITIS, HX OF 04/05/2010   DJD (degenerative joint disease)    right knee, mot to severe   Dyspnea    Dysrhythmia    GERD (gastroesophageal reflux disease)    no meds   Heart murmur    hx of    History of kidney stones    Hyperlipidemia    Hypertension    Impaired glucose tolerance 12/06/2013   Morbid obesity with BMI of 50.0-59.9, adult    Nausea & vomiting 05/24/2022   Peripheral vascular disease    Pneumonia    Pulmonary embolism    Past Surgical History:  Procedure Laterality Date   ABDOMINAL HYSTERECTOMY  age 51   fibroids   BREAST BIOPSY Left    COLONOSCOPY WITH PROPOFOL N/A 07/25/2016   Procedure: COLONOSCOPY WITH PROPOFOL;  Surgeon: Jeani Hawking, MD;  [facility];  Service: Endoscopy;  Laterality: N/A;   colonscopy     x 2   CYSTOSCOPY W/ URETERAL STENT PLACEMENT Right 09/05/2021   Procedure: CYSTOSCOPY WITH RETROGRADE PYELOGRAM/URETERAL STENT PLACEMENT;  Surgeon: [name], MD;  [facility];  Service: Urology;  Laterality: Right;   CYSTOSCOPY/URETEROSCOPY/HOLMIUM LASER/STENT PLACEMENT Right 09/30/2021   Procedure: CYSTOSCOPY/ RETROGRADE/URETEROSCOPY/HOLMIUM LASER/STENT PLACEMENT;  Surgeon: [name], MD;  [facility];  Service: Urology;  Laterality: Right;   IR RADIOLOGIST EVAL & MGMT  08/12/2016   IR RADIOLOGIST EVAL & MGMT  08/21/2016   KNEE ARTHROSCOPY     left    TOTAL KNEE ARTHROPLASTY  07/29/2011   Procedure: TOTAL KNEE ARTHROPLASTY;  Surgeon: Shelda Pal, MD;  [facility];  Service: Orthopedics;  Laterality: Right;   TOTAL KNEE ARTHROPLASTY Left 12/14/2012   Procedure: LEFT TOTAL KNEE ARTHROPLASTY;  Surgeon: Shelda Pal, MD;  [facility];   Service: Orthopedics;  Laterality: Left;     A IV Location/Drains/Wounds Patient Lines/Drains/Airways Status     Active Line/Drains/Airways     Name Placement date Placement time Site Days   Peripheral IV 05/24/22 20 G Anterior;Left;Proximal Forearm 05/24/22  0837  Forearm  less than 1   Ureteral Drain/Stent Right ureter 6 Fr. 09/30/21  1308  Right ureter  236            Intake/Output Last 24 hours  Intake/Output Summary (Last 24 hours) at 05/24/2022 1552 Last data filed at 05/24/2022 1223 Gross per 24 hour  Intake 1003 ml  Output --  Net 1003 ml    Labs/Imaging Results for orders placed or performed during the hospital encounter of 05/24/22 (from the past 48 hour(s))  Rapid urine drug screen (hospital performed)     Status: None   Collection Time: 05/24/22  5:42 AM  Result Value Ref Range   Opiates NONE DETECTED NONE DETECTED   Cocaine NONE DETECTED NONE DETECTED   Benzodiazepines NONE DETECTED NONE DETECTED   Amphetamines NONE DETECTED NONE DETECTED   Tetrahydrocannabinol NONE DETECTED NONE DETECTED   Barbiturates NONE DETECTED NONE DETECTED    Comment: (NOTE) DRUG SCREEN FOR MEDICAL PURPOSES ONLY.  IF CONFIRMATION IS NEEDED FOR ANY PURPOSE, NOTIFY LAB WITHIN 5 DAYS.  LOWEST DETECTABLE LIMITS FOR URINE DRUG SCREEN Drug Class                     Cutoff (ng/mL) Amphetamine and metabolites    1000 Barbiturate and metabolites    200 Benzodiazepine                 200 Opiates and metabolites        300 Cocaine and metabolites        300 THC                            50 Performed at [facility], [address] [place], [place] [address]   Comprehensive metabolic panel     Status: Abnormal   Collection Time: 05/24/22  5:44 AM  Result Value Ref Range   Sodium 140 135 - 145 mmol/L   Potassium 3.8 3.5 - 5.1 mmol/L   Chloride 104 98 - 111 mmol/L   CO2 19 (L) 22 - 32 mmol/L   Glucose, Bld 108 (H) 70 - 99 mg/dL    Comment: Glucose reference range applies  only to samples taken after fasting for at least 8 hours.   BUN 19 8 - 23 mg/dL   Creatinine, Ser 6.04 0.44 - 1.00 mg/dL   Calcium 9.1 8.9 - 54.0 mg/dL   Total Protein 7.7 6.5 - 8.1 g/dL   Albumin 3.5 3.5 - 5.0 g/dL   AST 56 (H) 15 - 41 U/L   ALT 39 0 - 44 U/L   Alkaline Phosphatase 108 38 - 126 U/L   Total Bilirubin 0.8 0.3 - 1.2  mg/dL   GFR, Estimated >62 >83 mL/min    Comment: (NOTE) Calculated using the CKD-EPI Creatinine Equation (2021)    Anion gap 17 (H) 5 - 15    Comment: Performed at [facility], [address], [place], [place] [address]  Ethanol     Status: Abnormal   Collection Time: 05/24/22  5:44 AM  Result Value Ref Range   Alcohol, Ethyl (B) 130 (H) <10 mg/dL    Comment: (NOTE) Lowest detectable limit for serum alcohol is 10 mg/dL.  For medical purposes only. Performed at [facility], [address] [place], [place] [address]   cbc     Status: None   Collection Time: 05/24/22  5:44 AM  Result Value Ref Range   WBC 10.4 4.0 - 10.5 K/uL   RBC 4.67 3.87 - 5.11 MIL/uL   Hemoglobin 14.2 12.0 - 15.0 g/dL   HCT 71.0 62.6 - 94.8 %   MCV 94.6 80.0 - 100.0 fL   MCH 30.4 26.0 - 34.0 pg   MCHC 32.1 30.0 - 36.0 g/dL   RDW 54.6 27.0 - 35.0 %   Platelets 300 150 - 400 K/uL   nRBC 0.0 0.0 - 0.2 %    Comment: Performed at [facility] Lab, [address] [place], [place] [address]  Lipase, blood     Status: None   Collection Time: 05/24/22  5:44 AM  Result Value Ref Range   Lipase 27 11 - 51 U/L    Comment: Performed at [facility], [address], [place], [place] [address]   No results found.  Pending Labs Unresulted Labs (From admission, onward)     Start     Ordered   05/25/22 0500  CBC  Tomorrow morning,   R        05/24/22 1146   05/25/22 0500  Comprehensive metabolic panel  Tomorrow morning,   R        05/24/22 1146   05/25/22 0500  Magnesium  Tomorrow morning,   R        05/24/22 1151   05/25/22 0500  Phosphorus  Tomorrow  morning,   R        05/24/22 1151            Vitals/Pain Today's Vitals   05/24/22 1525 05/24/22 1529 05/24/22 1530 05/24/22 1530  BP: 131/84  (!) 151/93   Pulse:   (!) 103   Resp: (!) 28  (!) 34   Temp:  99.1 F (37.3 C)    TempSrc:  Oral    SpO2: 95%  93%   Weight:      Height:      PainSc:    0-No pain    Isolation Precautions No active isolations  Medications Medications  LORazepam (ATIVAN) tablet 1-4 mg ( Oral See Alternative 05/24/22 1450)    Or  LORazepam (ATIVAN) injection 1-4 mg (4 mg Intravenous Given 05/24/22 1450)  thiamine (VITAMIN B1) tablet 100 mg ( Oral See Alternative 05/24/22 0954)    Or  thiamine (VITAMIN B1) injection 100 mg (100 mg Intravenous Given 05/24/22 0954)  folic acid (FOLVITE) tablet 1 mg (1 mg Oral Given 05/24/22 0954)  multivitamin with minerals tablet 1 tablet (1 tablet Oral Given 05/24/22 0954)  sodium chloride flush (NS) 0.9 % injection 3 mL (3 mLs Intravenous Given 05/24/22 1223)  acetaminophen (TYLENOL) tablet 650 mg (has no administration in time range)    Or  acetaminophen (  TYLENOL) suppository 650 mg (has no administration in time range)  ondansetron (ZOFRAN) tablet 4 mg (has no administration in time range)    Or  ondansetron (ZOFRAN) injection 4 mg (has no administration in time range)  albuterol (PROVENTIL) (2.5 MG/3ML) 0.083% nebulizer solution 2.5 mg (has no administration in time range)  metoprolol succinate (TOPROL-XL) 24 hr tablet 25 mg (25 mg Oral Not Given 05/24/22 1224)  chlordiazePOXIDE (LIBRIUM) capsule 25 mg (has no administration in time range)  loperamide (IMODIUM) capsule 2-4 mg (has no administration in time range)  LORazepam (ATIVAN) injection 1 mg (0 mg Intravenous Hold 05/24/22 1458)  PHENObarbital (LUMINAL) injection 97.5 mg (97.5 mg Intravenous Given 05/24/22 1301)    Followed by  PHENObarbital (LUMINAL) injection 65 mg (has no administration in time range)    Followed by  PHENObarbital (LUMINAL) injection 32.5  mg (has no administration in time range)  losartan (COZAAR) tablet 100 mg (100 mg Oral Not Given 05/24/22 1456)  0.9 %  sodium chloride infusion ( Intravenous New Bag/Given 05/24/22 1455)  lactated ringers bolus 1,000 mL (0 mLs Intravenous Stopped 05/24/22 0951)  pantoprazole (PROTONIX) injection 40 mg (40 mg Intravenous Given 05/24/22 0844)  LORazepam (ATIVAN) injection 1 mg (1 mg Intravenous Given 05/24/22 0844)  acetaminophen (TYLENOL) tablet 1,000 mg (1,000 mg Oral Given 05/24/22 1115)  chlordiazePOXIDE (LIBRIUM) capsule 50 mg (50 mg Oral Given 05/24/22 1251)  LORazepam (ATIVAN) injection 2 mg (2 mg Intravenous Given 05/24/22 1451)    Mobility walks with device     Focused Assessments Cardiac Assessment Handoff:  Cardiac Rhythm: Sinus tachycardia Lab Results  Component Value Date   CKTOTAL 426 (H) 09/05/2021   CKMB (HH) 07/03/2009    10.4 CRITICAL RESULT CALLED TO, READ BACK BY AND VERIFIED WITH: RN M. HOBBS 07/03/09 1603 KERAN M.   TROPONINI 0.03 (HH) 07/06/2018   Lab Results  Component Value Date   DDIMER 0.88 (H) 07/06/2018   Does the Patient currently have chest pain? No  Pt reports upper right abd pain Pt speech is more slurred and she is confused when talking further.  R Recommendations: See Admitting Provider Note  Report given to:   Additional Notes: Q1 CIWA

## 2022-05-24 NOTE — Consult Note (Signed)
NAME:  [name], MRN:  [id number], DOB:  1960-04-17, LOS: 0 ADMISSION DATE:  05/24/2022, CONSULTATION DATE: 05/24/2022 REFERRING MD: Dr. Katrinka Blazing, CHIEF COMPLAINT: Alcohol withdrawal  History of Present Illness:  62 year old female with hypertension, hyperlipidemia, prediabetes, morbid obesity and alcohol dependence was brought into the emergency department to get some help with alcohol withdrawal.  As per patient she was in her usual state of health until she went on vacation 2 weeks ago when she started drinking 1/5 of vodka every day last drink was yesterday, she presented with nausea vomiting, tachycardic and shaking.  She denies shortness of breath, chest pain, dysuria, urgency, fever or chills.  PCCM was consulted for help evaluation and possible ICU admission  Pertinent  Medical History   Past Medical History:  Diagnosis Date   Abscess of bladder 07/28/2016   Alcohol dependence    Allergic rhinitis 12/06/2013   Anxiety    Asthma 03/16/2015   Atrial flutter with rapid ventricular response 04/01/2020   COLONIC POLYPS, HX OF 04/05/2010   DIVERTICULITIS, HX OF 04/05/2010   DJD (degenerative joint disease)    right knee, mot to severe   Dyspnea    Dysrhythmia    GERD (gastroesophageal reflux disease)    no meds   Heart murmur    hx of    History of kidney stones    Hyperlipidemia    Hypertension    Impaired glucose tolerance 12/06/2013   Morbid obesity with BMI of 50.0-59.9, adult    Nausea & vomiting 05/24/2022   Peripheral vascular disease    Pneumonia    Pulmonary embolism      Significant Hospital Events: Including procedures, antibiotic start and stop dates in addition to other pertinent events     Interim History / Subjective:  Consulted  Objective   Blood pressure 126/76, pulse (!) 115, temperature 98 F (36.7 C), resp. rate (!) 29, height 5\' 1"  (1.549 m), weight 127 kg, SpO2 95 %.        Intake/Output Summary (Last 24 hours) at 05/24/2022 1326 Last  data filed at 05/24/2022 1223 Gross per 24 hour  Intake 1003 ml  Output --  Net 1003 ml   Filed Weights   05/24/22 0954  Weight: 127 kg    Examination: Physical exam: General: Acute on chronically ill-appearing morbidly obese female, lying on the bed HEENT: [place]/AT, eyes anicteric.  Dry mucus membranes Neuro: Awake, following commands, confused, dysarthric Chest: Coarse breath sounds, no wheezes or rhonchi Heart: Tachycardic, no murmurs or gallops Abdomen: Soft, nontender, nondistended, bowel sounds present Skin: No rash  Labs and images were reviewed  Resolved Hospital Problem list     Assessment & Plan:  Alcohol withdrawal complicated with delirium Patient is withdrawing from alcohol actively though alcohol level is 130 which suggests she drinks large volume of alcohol Currently she is hallucinating Continue IV fluid Continue thiamine and folate Started phenobarbital high risk alcohol withdrawal protocol Closely monitor CIWA protocol  Hypertension Patient blood pressure is well-controlled Recommend resuming losartan Hold HCTZ as she is dehydrated  Acute alcoholic hepatitis Patient's LFTs are elevated likely due to alcoholic hepatitis Closely monitor  Subacute PE on anticoagulation Paroxysmal A-fib Remain in sinus rhythm Resume Xarelto  Morbid obesity Diet and exercise counseling when appropriate  At this time I do not think patient needs ICU level of care, she can be admitted to progressive care Please call with questions Best Practice (right click and "Reselect all SmartList Selections" daily)   Per primary  team  Labs   CBC: Recent Labs  Lab 05/24/22 0544  WBC 10.4  HGB 14.2  HCT 44.2  MCV 94.6  PLT 300    Basic Metabolic Panel: Recent Labs  Lab 05/24/22 0544  NA 140  K 3.8  CL 104  CO2 19*  GLUCOSE 108*  BUN 19  CREATININE 0.98  CALCIUM 9.1   GFR: Estimated Creatinine Clearance: 75.7 mL/min (by C-G formula based on SCr of 0.98  mg/dL). Recent Labs  Lab 05/24/22 0544  WBC 10.4    Liver Function Tests: Recent Labs  Lab 05/24/22 0544  AST 56*  ALT 39  ALKPHOS 108  BILITOT 0.8  PROT 7.7  ALBUMIN 3.5   Recent Labs  Lab 05/24/22 0544  LIPASE 27   No results for input(s): "AMMONIA" in the last 168 hours.  ABG    Component Value Date/Time   TCO2 29 05/14/2009 0826     Coagulation Profile: No results for input(s): "INR", "PROTIME" in the last 168 hours.  Cardiac Enzymes: No results for input(s): "CKTOTAL", "CKMB", "CKMBINDEX", "TROPONINI" in the last 168 hours.  HbA1C: Hgb A1c MFr Bld  Date/Time Value Ref Range Status  03/24/2022 02:47 PM 6.1 4.6 - 6.5 % Final    Comment:    Glycemic Control Guidelines for People with Diabetes:Non Diabetic:  <6%Goal of Therapy: <7%Additional Action Suggested:  >8%   11/06/2021 10:49 AM 6.5 4.6 - 6.5 % Final    Comment:    Glycemic Control Guidelines for People with Diabetes:Non Diabetic:  <6%Goal of Therapy: <7%Additional Action Suggested:  >8%     CBG: No results for input(s): "GLUCAP" in the last 168 hours.  Review of Systems:   12 point review of system is significant for complaint mentioned HPI, rest is negative  Past Medical History:  She,  has a past medical history of Abscess of bladder (07/28/2016), Alcohol dependence, Allergic rhinitis (12/06/2013), Anxiety, Asthma (03/16/2015), Atrial flutter with rapid ventricular response (04/01/2020), COLONIC POLYPS, HX OF (04/05/2010), DIVERTICULITIS, HX OF (04/05/2010), DJD (degenerative joint disease), Dyspnea, Dysrhythmia, GERD (gastroesophageal reflux disease), Heart murmur, History of kidney stones, Hyperlipidemia, Hypertension, Impaired glucose tolerance (12/06/2013), Morbid obesity with BMI of 50.0-59.9, adult, Nausea & vomiting (05/24/2022), Peripheral vascular disease, Pneumonia, and Pulmonary embolism.   Surgical History:   Past Surgical History:  Procedure Laterality Date   ABDOMINAL HYSTERECTOMY   age 26   fibroids   BREAST BIOPSY Left    COLONOSCOPY WITH PROPOFOL N/A 07/25/2016   Procedure: COLONOSCOPY WITH PROPOFOL;  Surgeon: Jeani Hawking, MD;  Location: [facility];  Service: Endoscopy;  Laterality: N/A;   colonscopy     x 2   CYSTOSCOPY W/ URETERAL STENT PLACEMENT Right 09/05/2021   Procedure: CYSTOSCOPY WITH RETROGRADE PYELOGRAM/URETERAL STENT PLACEMENT;  Surgeon: [name], MD;  Location: [facility];  Service: Urology;  Laterality: Right;   CYSTOSCOPY/URETEROSCOPY/HOLMIUM LASER/STENT PLACEMENT Right 09/30/2021   Procedure: CYSTOSCOPY/ RETROGRADE/URETEROSCOPY/HOLMIUM LASER/STENT PLACEMENT;  Surgeon: [name], MD;  Location: [facility];  Service: Urology;  Laterality: Right;   IR RADIOLOGIST EVAL & MGMT  08/12/2016   IR RADIOLOGIST EVAL & MGMT  08/21/2016   KNEE ARTHROSCOPY     left    TOTAL KNEE ARTHROPLASTY  07/29/2011   Procedure: TOTAL KNEE ARTHROPLASTY;  Surgeon: Shelda Pal, MD;  Location: [facility];  Service: Orthopedics;  Laterality: Right;   TOTAL KNEE ARTHROPLASTY Left 12/14/2012   Procedure: LEFT TOTAL KNEE ARTHROPLASTY;  Surgeon: Shelda Pal, MD;  Location: [facility];  Service: Orthopedics;  Laterality: Left;     Social History:   reports that she quit smoking about 39 years ago. Her smoking use included cigarettes. She has a 3.50 pack-year smoking history. She has never used smokeless tobacco. She reports that she does not currently use alcohol. She reports that she does not currently use drugs after having used the following drugs: Marijuana.   Family History:  Her family history includes Alcoholism in her sister; COPD in her father; Lymphoma in her sister; Stroke in her mother.   Allergies Allergies  Allergen Reactions   Morphine Itching   Shrimp [Shellfish Allergy] Itching and Other (See Comments)    Tongue burns also   Tramadol Other (See Comments)    Caused confusion   Covid-19 (Mrna) Vaccine Hives   Diltiazem Hcl Itching    Pt with itching of  the feet when bolus given   Other Other (See Comments)   Latex Rash     Home Medications  Prior to Admission medications   Medication Sig Start Date End Date Taking? Authorizing Provider  albuterol (VENTOLIN HFA) 108 (90 Base) MCG/ACT inhaler INHALE 2 PUFFS EVERY 6 HOURS AS NEEDED 03/12/22   Corwin Levins, MD  atorvastatin (LIPITOR) 20 MG tablet TAKE 1 TABLET BY MOUTH EVERY DAY 10/03/21   Corwin Levins, MD  Brimonidine Tartrate (LUMIFY) 0.025 % SOLN Place 1 drop into both eyes daily as needed (redness).    [provider]  calcium carbonate (TUMS - DOSED IN MG ELEMENTAL CALCIUM) 500 MG chewable tablet Chew 1 tablet by mouth daily as needed for indigestion or heartburn.    [provider]  ELIQUIS 5 MG TABS tablet Take 5 mg by mouth 2 (two) times daily. 01/19/22   [provider]  famotidine (PEPCID) 20 MG tablet Take 1 tablet (20 mg total) by mouth 2 (two) times daily. 11/13/21   Cobb, Ruby Cola, NP  guaiFENesin-dextromethorphan (ROBITUSSIN DM) 100-10 MG/5ML syrup Take 5 mLs by mouth every 4 (four) hours as needed for cough. 03/12/22   Corwin Levins, MD  levocetirizine (XYZAL) 5 MG tablet Take 1 tablet (5 mg total) by mouth every evening. 11/13/21   Cobb, Ruby Cola, NP  LORazepam (ATIVAN) 0.5 MG tablet TAKE 1 TABLET BY MOUTH 2 TIMES DAILY AS NEEDED FOR ANXIETY. 03/25/22   Corwin Levins, MD  losartan-hydrochlorothiazide (HYZAAR) 100-25 MG tablet Take 1 tablet by mouth daily.    [provider]  methocarbamol (ROBAXIN) 500 MG tablet TAKE 1 TABLET BY MOUTH 4 TIMES DAILY. 04/11/22   Corwin Levins, MD  metoprolol succinate (TOPROL-XL) 25 MG 24 hr tablet Take 1 tablet (25 mg total) by mouth daily. Appt req for refill [id number] 05/08/22   Fenton, Clint R, PA  mometasone-formoterol (DULERA) 100-5 MCG/ACT AERO Inhale 2 puffs into the lungs in the morning and at bedtime. 11/19/21   Cobb, Ruby Cola, NP  Multiple Vitamin (MULTIVITAMIN WITH MINERALS) TABS tablet Take 1  tablet by mouth daily. 09/11/21   Glade Lloyd, MD  ondansetron (ZOFRAN-ODT) 4 MG disintegrating tablet TAKE 1 TABLET BY MOUTH EVERY 8 HOURS AS NEEDED 01/07/22   Corwin Levins, MD  pantoprazole (PROTONIX) 40 MG tablet Take 1 tablet (40 mg total) by mouth daily. 11/19/21   Cobb, Ruby Cola, NP  rivaroxaban (XARELTO) 20 MG TABS tablet Take 1 tablet (20 mg total) by mouth daily with supper. 11/11/21   Newman Nip, NP  thiamine (VITAMIN B1) 100 MG tablet Take 1 tablet (  100 mg total) by mouth daily. 09/11/21   Glade Lloyd, MD  triamcinolone cream (KENALOG) 0.1 % Apply 1 Application topically 2 (two) times daily as needed (itching). 11/06/21   Corwin Levins, MD     This patient is critically ill with multiple organ system failure which requires frequent high complexity decision making, assessment, support, evaluation, and titration of therapies. This was completed through the application of advanced monitoring technologies and extensive interpretation of multiple databases.  During this encounter critical care time was devoted to patient care services described in this note for .    Cheri Fowler, MD [facility] See Amion for pager If no response to pager, please call [phone] until 7pm After 7pm, Please call E-link [phone]

## 2022-05-24 NOTE — ED Notes (Signed)
MD notified about Ciwa Score.

## 2022-05-24 NOTE — ED Notes (Signed)
Md made aware of CIWA score of 23.

## 2022-05-24 NOTE — ED Notes (Signed)
The pt is easily arroused but falls back to sleep in mid-sentences  alert  and oriented x 4 she ate ma little food  but nont much  she wants to keep her salad

## 2022-05-24 NOTE — ED Notes (Signed)
While transporting patient in elevator, patient went into Vtach twice for approximately seven beats. Receiving nurse made aware along with hospitalist.

## 2022-05-24 NOTE — ED Provider Notes (Addendum)
EMERGENCY DEPARTMENT AT [facility] Provider Note   CSN: [id number] Arrival date & time: 05/24/22  0522     History  Chief Complaint  Patient presents with   Alcohol Problem    [name] is a 62 y.o. female.  Patient is a 62 year old female with a history of hypertension, GERD, asthma, a flutter with RVR, PVD, pulmonary embolism on xarelto and chronic alcohol dependence who is presenting today with a complaint of alcohol withdrawal.  Patient reports that in the last month she has been drinking at least 1/5 of alcohol daily and had her last drink yesterday evening.  She is complaining of feeling shaky, stomach upset and having some vomiting.  She denies any shortness of breath or chest pain.  She does report she has had to be admitted in the past for alcohol withdrawal.  She denies any known history of seizures and denies any hallucinations at this time.    The history is provided by the patient and medical records.  Alcohol Problem       Home Medications Prior to Admission medications   Medication Sig Start Date End Date Taking? Authorizing Provider  albuterol (VENTOLIN HFA) 108 (90 Base) MCG/ACT inhaler INHALE 2 PUFFS EVERY 6 HOURS AS NEEDED 03/12/22   Corwin Levins, MD  atorvastatin (LIPITOR) 20 MG tablet TAKE 1 TABLET BY MOUTH EVERY DAY 10/03/21   Corwin Levins, MD  Brimonidine Tartrate (LUMIFY) 0.025 % SOLN Place 1 drop into both eyes daily as needed (redness).    [provider]  calcium carbonate (TUMS - DOSED IN MG ELEMENTAL CALCIUM) 500 MG chewable tablet Chew 1 tablet by mouth daily as needed for indigestion or heartburn.    [provider]  ELIQUIS 5 MG TABS tablet Take 5 mg by mouth 2 (two) times daily. 01/19/22   [provider]  famotidine (PEPCID) 20 MG tablet Take 1 tablet (20 mg total) by mouth 2 (two) times daily. 11/13/21   Cobb, Ruby Cola, NP  guaiFENesin-dextromethorphan (ROBITUSSIN DM) 100-10 MG/5ML syrup  Take 5 mLs by mouth every 4 (four) hours as needed for cough. 03/12/22   Corwin Levins, MD  levocetirizine (XYZAL) 5 MG tablet Take 1 tablet (5 mg total) by mouth every evening. 11/13/21   Cobb, Ruby Cola, NP  LORazepam (ATIVAN) 0.5 MG tablet TAKE 1 TABLET BY MOUTH 2 TIMES DAILY AS NEEDED FOR ANXIETY. 03/25/22   Corwin Levins, MD  losartan-hydrochlorothiazide (HYZAAR) 100-25 MG tablet Take 1 tablet by mouth daily.    [provider]  methocarbamol (ROBAXIN) 500 MG tablet TAKE 1 TABLET BY MOUTH 4 TIMES DAILY. 04/11/22   Corwin Levins, MD  metoprolol succinate (TOPROL-XL) 25 MG 24 hr tablet Take 1 tablet (25 mg total) by mouth daily. Appt req for refill [id number] 05/08/22   Fenton, Clint R, PA  mometasone-formoterol (DULERA) 100-5 MCG/ACT AERO Inhale 2 puffs into the lungs in the morning and at bedtime. 11/19/21   Cobb, Ruby Cola, NP  Multiple Vitamin (MULTIVITAMIN WITH MINERALS) TABS tablet Take 1 tablet by mouth daily. 09/11/21   Glade Lloyd, MD  ondansetron (ZOFRAN-ODT) 4 MG disintegrating tablet TAKE 1 TABLET BY MOUTH EVERY 8 HOURS AS NEEDED 01/07/22   Corwin Levins, MD  pantoprazole (PROTONIX) 40 MG tablet Take 1 tablet (40 mg total) by mouth daily. 11/19/21   Cobb, Ruby Cola, NP  rivaroxaban (XARELTO) 20 MG TABS tablet Take 1 tablet (20 mg total) by mouth daily with  supper. 11/11/21   Newman Nip, NP  thiamine (VITAMIN B1) 100 MG tablet Take 1 tablet (100 mg total) by mouth daily. 09/11/21   Glade Lloyd, MD  triamcinolone cream (KENALOG) 0.1 % Apply 1 Application topically 2 (two) times daily as needed (itching). 11/06/21   Corwin Levins, MD      Allergies    Morphine, Shrimp [shellfish allergy], Tramadol, Covid-19 (mrna) vaccine, Diltiazem hcl, Other, and Latex    Review of Systems   Review of Systems  Physical Exam Updated Vital Signs BP (!) 157/88 (BP Location: Right Arm)   Pulse (!) 106   Temp 98.4 F (36.9 C) (Oral)   Resp 20   Ht  (1.549 m)   Wt 127 kg    SpO2 100%   BMI 52.91 kg/m  Physical Exam Vitals and nursing note reviewed.  Constitutional:      General: She is in acute distress.     Appearance: She is well-developed.  HENT:     Head: Normocephalic and atraumatic.  Eyes:     Pupils: Pupils are equal, round, and reactive to light.  Cardiovascular:     Rate and Rhythm: Regular rhythm. Tachycardia present.     Heart sounds: Normal heart sounds. No murmur heard.    No friction rub.  Pulmonary:     Effort: Pulmonary effort is normal.     Breath sounds: Normal breath sounds. No wheezing or rales.  Abdominal:     General: Bowel sounds are normal. There is no distension.     Palpations: Abdomen is soft.     Tenderness: There is no abdominal tenderness. There is no guarding or rebound.  Musculoskeletal:        General: No tenderness. Normal range of motion.     Right lower leg: No edema.     Left lower leg: No edema.     Comments: No edema  Skin:    General: Skin is warm and dry.     Findings: No rash.  Neurological:     Mental Status: She is alert and oriented to person, place, and time.     Cranial Nerves: No cranial nerve deficit.     Comments: Patient is awake and alert and able to move all extremities but has fine tremors noted in the bilateral upper extremities and some mild tongue fasciculation  Psychiatric:        Mood and Affect: Mood normal.        Behavior: Behavior normal.     Comments: No hallucinations     ED Results / Procedures / Treatments   Labs (all labs ordered are listed, but only abnormal results are displayed) Labs Reviewed  COMPREHENSIVE METABOLIC PANEL - Abnormal; Notable for the following components:      Result Value   CO2 19 (*)    Glucose, Bld 108 (*)    AST 56 (*)    Anion gap 17 (*)    All other components within normal limits  ETHANOL - Abnormal; Notable for the following components:   Alcohol, Ethyl (B) 130 (*)    All other components within normal limits  CBC  RAPID URINE DRUG  SCREEN, HOSP PERFORMED    EKG None  Radiology No results found.  Procedures Procedures    Medications Ordered in ED Medications  LORazepam (ATIVAN) tablet 1-4 mg ( Oral See Alternative 05/24/22 0953)    Or  LORazepam (ATIVAN) injection 1-4 mg (4 mg Intravenous Given 05/24/22 0953)  thiamine (  VITAMIN B1) tablet 100 mg ( Oral See Alternative 05/24/22 0954)    Or  thiamine (VITAMIN B1) injection 100 mg (100 mg Intravenous Given 05/24/22 0954)  folic acid (FOLVITE) tablet 1 mg (1 mg Oral Given 05/24/22 0954)  multivitamin with minerals tablet 1 tablet (1 tablet Oral Given 05/24/22 0954)  acetaminophen (TYLENOL) tablet 1,000 mg (has no administration in time range)  lactated ringers bolus 1,000 mL (0 mLs Intravenous Stopped 05/24/22 0951)  pantoprazole (PROTONIX) injection 40 mg (40 mg Intravenous Given 05/24/22 0844)  LORazepam (ATIVAN) injection 1 mg (1 mg Intravenous Given 05/24/22 0844)    ED Course/ Medical Decision Making/ A&P                             Medical Decision Making Amount and/or Complexity of Data Reviewed External Data Reviewed: notes. Labs: ordered. Decision-making details documented in ED Course.  Risk OTC drugs. Prescription drug management. Decision regarding hospitalization.   Pt with multiple medical problems and comorbidities and presenting today with a complaint that caries a high risk for morbidity and mortality.  Here today with concern for alcohol withdrawal.  Patient has been drinking heavily for the last month and reports her last drink was yesterday.  Patient is complaining of being shaky with nausea and vomiting.  On exam patient does have tremors as well as hypertension and tachycardia.  Will give patient Ativan and start on CIWA.  I independently interpreted patient's labs CMP with normal sodium and potassium levels today, CO2 mildly decreased at 19 and an anion gap of 17, alcohol level of 130, CBC within normal limits, drug screen is negative.  Will  give patient IV fluids and symptom control.  Initial CIWA is 22. Will reevaluate to see if patient requires admission versus home with Librium and outpatient follow-up with alcohol abuse resources.  11:02 AM After 1 of ativan pt's CIWA was still >20 and she was given 4mg  of ativan.  On reassessment pt is still shaky and tachycardic but improved from prior.  Feel pt will need admission for alcohol withdrawal and ongoing CIWA. Pt also reporting now she keeps thinking she sees someone in her bed but knows that is not real. Consulted the hospitalist for admission.  Findings discussed with the patient.  She is comfortable with this plan.  11:06 AM Pt just received another 4mg  of ativan  CRITICAL CARE Performed by: Coila Wardell Total critical care time: 30 minutes Critical care time was exclusive of separately billable procedures and treating other patients. Critical care was necessary to treat or prevent imminent or life-threatening deterioration. Critical care was time spent personally by me on the following activities: development of treatment plan with patient and/or surrogate as well as nursing, discussions with consultants, evaluation of patient's response to treatment, examination of patient, obtaining history from patient or surrogate, ordering and performing treatments and interventions, ordering and review of laboratory studies, ordering and review of radiographic studies, pulse oximetry and re-evaluation of patient's condition.          Final Clinical Impression(s) / ED Diagnoses Final diagnoses:  Alcohol withdrawal syndrome with perceptual disturbance    Rx / DC Orders ED Discharge Orders     None         Gwyneth Sprout, MD 05/24/22 1102    Gwyneth Sprout, MD 05/24/22 1106

## 2022-05-24 NOTE — ED Notes (Signed)
Md notified of CIWA score of 21.

## 2022-05-24 NOTE — Progress Notes (Signed)
CSW added substance abuse resources to patient's AVS.  Catheryne Deford, MSW, LCSW Transitions of Care  Clinical Social Worker II [phone]  

## 2022-05-24 NOTE — ED Notes (Signed)
Suction set up at bedside

## 2022-05-24 NOTE — ED Notes (Signed)
Seizure pads placed on patient bed rails.

## 2022-05-24 NOTE — ED Notes (Signed)
The pt starts talking then stops and goes back to sleep  se wants something for her headache

## 2022-05-24 NOTE — ED Notes (Signed)
Patient hallucinating people in room, speech is becoming more slurred. Patient having increased work of breathing with SOB and accessory muscle use. MD made aware.

## 2022-05-24 NOTE — H&P (Addendum)
History and Physical    Patient: [name] ZOX:[id number] DOB: December 02, 1960 DOA: 05/24/2022 DOS: the patient was seen and examined on 05/24/2022 PCP: Corwin Levins, MD  Patient coming from: Home  Chief Complaint:  Chief Complaint  Patient presents with   Alcohol Problem   HPI: [name] is a 62 y.o. female with medical history significant of hypertension, hyperlipidemia, pulmonary embolism, prediabetes, alcohol abuse, and morbid obesity who presents with complaints of tremors.  At baseline patient is able to ambulate without need of assistance.  She had been off on vacation for the last 2 weeks and has been drinking 1/5 or more of vodka per day on average.  Her last drink was yesterday afternoon.  She reported waking up this morning with severe shakes for which she was unable to walk.  Reported having nausea, vomiting, and epigastric abdomen pain with episodes of vomiting.  Reported having some bright red blood present in emesis, but thought it was secondary to her brushing her teeth this morning.  She had not had any fever, shortness of breath, or dysuria symptoms.  Patient did not note that she had been seeing some in in her hospital room for which she knows he is not real, but had moved the side rails very behind her head to avoid seeing him.  She does admit to having previous hospitalization for alcohol withdrawals in the past but denies any prior history of seizures related.  In the emergency department patient was noted to be afebrile with tachycardia and blood pressure elevated up to 176/99.  Labs significant for AST 56, ALT 39, and ethanol level 130.  UDS negative.  Patient has been given 1 L lactated Ringer's, Ativan IV, Protonix, and acetaminophen.  Initial CIWA scores were noted to be 22-23-21. [facility] called to admit for alcohol withdrawals.  Review of Systems: As mentioned in the history of present illness. All other systems reviewed and are negative. Past Medical History:   Diagnosis Date   Abscess of bladder 07/28/2016   Alcohol dependence    Allergic rhinitis 12/06/2013   Anxiety    Asthma 03/16/2015   Atrial flutter with rapid ventricular response 04/01/2020   COLONIC POLYPS, HX OF 04/05/2010   DIVERTICULITIS, HX OF 04/05/2010   DJD (degenerative joint disease)    right knee, mot to severe   Dyspnea    Dysrhythmia    GERD (gastroesophageal reflux disease)    no meds   Heart murmur    hx of    History of kidney stones    Hyperlipidemia    Hypertension    Impaired glucose tolerance 12/06/2013   Morbid obesity with BMI of 50.0-59.9, adult    Peripheral vascular disease    Pneumonia    Pulmonary embolism    Past Surgical History:  Procedure Laterality Date   ABDOMINAL HYSTERECTOMY  age 77   fibroids   BREAST BIOPSY Left    COLONOSCOPY WITH PROPOFOL N/A 07/25/2016   Procedure: COLONOSCOPY WITH PROPOFOL;  Surgeon: Jeani Hawking, MD;  Location: [facility];  Service: Endoscopy;  Laterality: N/A;   colonscopy     x 2   CYSTOSCOPY W/ URETERAL STENT PLACEMENT Right 09/05/2021   Procedure: CYSTOSCOPY WITH RETROGRADE PYELOGRAM/URETERAL STENT PLACEMENT;  Surgeon: [name], MD;  Location: [facility];  Service: Urology;  Laterality: Right;   CYSTOSCOPY/URETEROSCOPY/HOLMIUM LASER/STENT PLACEMENT Right 09/30/2021   Procedure: CYSTOSCOPY/ RETROGRADE/URETEROSCOPY/HOLMIUM LASER/STENT PLACEMENT;  Surgeon: [name], MD;  Location: [facility];  Service: Urology;  Laterality:  Right;   IR RADIOLOGIST EVAL & MGMT  08/12/2016   IR RADIOLOGIST EVAL & MGMT  08/21/2016   KNEE ARTHROSCOPY     left    TOTAL KNEE ARTHROPLASTY  07/29/2011   Procedure: TOTAL KNEE ARTHROPLASTY;  Surgeon: Shelda Pal, MD;  Location: [facility];  Service: Orthopedics;  Laterality: Right;   TOTAL KNEE ARTHROPLASTY Left 12/14/2012   Procedure: LEFT TOTAL KNEE ARTHROPLASTY;  Surgeon: Shelda Pal, MD;  Location: [facility];  Service: Orthopedics;  Laterality: Left;   Social History:   reports that she quit smoking about 39 years ago. Her smoking use included cigarettes. She has a 3.50 pack-year smoking history. She has never used smokeless tobacco. She reports that she does not currently use alcohol. She reports that she does not currently use drugs after having used the following drugs: Marijuana.  Allergies  Allergen Reactions   Morphine Itching   Shrimp [Shellfish Allergy] Itching and Other (See Comments)    Tongue burns also   Tramadol Other (See Comments)    Caused confusion   Covid-19 (Mrna) Vaccine Hives   Diltiazem Hcl Itching    Pt with itching of the feet when bolus given   Other Other (See Comments)   Latex Rash    Family History  Problem Relation Age of Onset   Stroke Mother    COPD Father    Lymphoma Sister    Alcoholism Sister     Prior to Admission medications   Medication Sig Start Date End Date Taking? Authorizing Provider  albuterol (VENTOLIN HFA) 108 (90 Base) MCG/ACT inhaler INHALE 2 PUFFS EVERY 6 HOURS AS NEEDED 03/12/22   Corwin Levins, MD  atorvastatin (LIPITOR) 20 MG tablet TAKE 1 TABLET BY MOUTH EVERY DAY 10/03/21   Corwin Levins, MD  Brimonidine Tartrate (LUMIFY) 0.025 % SOLN Place 1 drop into both eyes daily as needed (redness).    [provider]  calcium carbonate (TUMS - DOSED IN MG ELEMENTAL CALCIUM) 500 MG chewable tablet Chew 1 tablet by mouth daily as needed for indigestion or heartburn.    [provider]  ELIQUIS 5 MG TABS tablet Take 5 mg by mouth 2 (two) times daily. 01/19/22   [provider]  famotidine (PEPCID) 20 MG tablet Take 1 tablet (20 mg total) by mouth 2 (two) times daily. 11/13/21   Cobb, Ruby Cola, NP  guaiFENesin-dextromethorphan (ROBITUSSIN DM) 100-10 MG/5ML syrup Take 5 mLs by mouth every 4 (four) hours as needed for cough. 03/12/22   Corwin Levins, MD  levocetirizine (XYZAL) 5 MG tablet Take 1 tablet (5 mg total) by mouth every evening. 11/13/21   Cobb, Ruby Cola, NP  LORazepam  (ATIVAN) 0.5 MG tablet TAKE 1 TABLET BY MOUTH 2 TIMES DAILY AS NEEDED FOR ANXIETY. 03/25/22   Corwin Levins, MD  losartan-hydrochlorothiazide (HYZAAR) 100-25 MG tablet Take 1 tablet by mouth daily.    [provider]  methocarbamol (ROBAXIN) 500 MG tablet TAKE 1 TABLET BY MOUTH 4 TIMES DAILY. 04/11/22   Corwin Levins, MD  metoprolol succinate (TOPROL-XL) 25 MG 24 hr tablet Take 1 tablet (25 mg total) by mouth daily. Appt req for refill [id number] 05/08/22   Fenton, Clint R, PA  mometasone-formoterol (DULERA) 100-5 MCG/ACT AERO Inhale 2 puffs into the lungs in the morning and at bedtime. 11/19/21   Cobb, Ruby Cola, NP  Multiple Vitamin (MULTIVITAMIN WITH MINERALS) TABS tablet Take 1 tablet by mouth daily. 09/11/21   Glade Lloyd, MD  ondansetron (ZOFRAN-ODT) 4 MG disintegrating tablet TAKE 1 TABLET BY MOUTH EVERY 8 HOURS AS NEEDED 01/07/22   Corwin Levins, MD  pantoprazole (PROTONIX) 40 MG tablet Take 1 tablet (40 mg total) by mouth daily. 11/19/21   Cobb, Ruby Cola, NP  rivaroxaban (XARELTO) 20 MG TABS tablet Take 1 tablet (20 mg total) by mouth daily with supper. 11/11/21   Newman Nip, NP  thiamine (VITAMIN B1) 100 MG tablet Take 1 tablet (100 mg total) by mouth daily. 09/11/21   Glade Lloyd, MD  triamcinolone cream (KENALOG) 0.1 % Apply 1 Application topically 2 (two) times daily as needed (itching). 11/06/21   Corwin Levins, MD    Physical Exam: Vitals:   05/24/22 4098 05/24/22 0951 05/24/22 0954 05/24/22 1104  BP: (!) 172/101 (!) 157/88  124/80  Pulse: (!) 109 (!) 106  (!) 113  Resp:  20  20  Temp:      TempSrc:      SpO2:  100%  100%  Weight:   127 kg   Height:    (1.549 m)    Constitutional: Obese female who appears to be able to follow commands at this time Eyes: PERRL, lids and conjunctivae normal ENMT: Mucous membranes are moist.  Neck: normal, supple  Respiratory: clear to auscultation bilaterally, no wheezing, no crackles. Normal respiratory effort. No  accessory muscle use.  Cardiovascular: Tachycardic.   No significant lower extremity edema Abdomen: Protuberant abdomen without significant tenderness to palpation.  Bowel sounds positive.  Musculoskeletal: no clubbing / cyanosis.  Healed scars prior near replacement. Skin: no rashes, lesions, ulcers. No induration Neurologic: CN 2-12 grossly intact.  Strength 5/5 in all 4.  Psychiatric: Patient appears intermittently confused at times, but is oriented x 3 at this time.  Did report having hallucinations the patient of seeing a man present in the room that was not real.  Data Reviewed:  EKG reveals sinus tachycardia 109 bpm with borderline prolonged QT interval.  Reviewed labs, imaging, and pertinent records as noted above in HPI  Assessment and Plan: Alcohol withdrawals with perceptual disturbance Patient presents after being unable to get up and ambulate this morning due to severe tremors.  Admits to recent use of 1/5 of vodka per day on average.  Noted associated symptoms of nausea, vomiting, abdominal pain, and reports of hallucinations.  Patient was given several rounds of IV Ativan without significant improvement in CIWA score. -Admit to a progressive bed -CIWA protocols initiated with scheduled and as needed Ativan -Multivitamin, thiamine, folic acid -Physical therapy to eval and treat in a.m. -Transitions of care for alcohol abuse -PCCM consulted to evaluate to see if patient, and recommended phenobarbital which orders have been placed.  Nausea and vomiting Abdominal pain Patient reports having episodes of nausea and vomiting.  Emesis was reported to have some blood present but thought secondary to brushing teeth. Associated complaints of abdominal pain with vomiting.  Vital signs and hemoglobin otherwise noted to be stable.   Suspect possibly secondary to alcoholic gastritis, but also questioning possibility of pancreatitis.  While in the ED and patient was able to keep a sandwich  down. -Aspiration precautions with elevation of head of bed -Check lipase -Antiemetics as needed  Metabolic acidosis with elevated anion gap Acute.  Initial CO2 19 with anion gap of 17.  Thought to be secondary to patient alcohol use as blood sugars were 108. -IV fluids  -Recheck BMP  Essential hypertension Initial blood pressures elevated up to 176/99. -  Continue metoprolol, but had to be switched to metoprolol IV 2.5 mg IV every 6 hours due to patient not tolerating p.o. meds -Continue Loartan -Held lhydrochlorothiazide in the setting of reports of recent nausea and vomiting  History of PE  Paroxysmal atrial fibrillation on chronic anticoagulation Patient appears to be in a sinus tachycardia and reported compliance with Eliquis. -Continue Eliquis  History of asthma No significant wheezing noted on physical exam. -Continue Dulera  Diabetes mellitus type 2, without long-term use of insulin On admission initial blood sugar 108.  Last available hemoglobin A1c was 6.1.  Elevated AST Acute on chronic.  AST noted to be 56 with ALT 39.  Suspect secondary to patient's use of alcohol. -Continue to monitor  Morbid obesity BMI 52.91 kg/m  GERD -Continue Pepcid  DVT prophylaxis: Eliquis Advance Care Planning:   Code Status: Full Code   Consults: None  Family Communication: Left message on patient's husbandvoicemail  Severity of Illness: The appropriate patient status for this patient is INPATIENT. Inpatient status is judged to be reasonable and necessary in order to provide the required intensity of service to ensure the patient's safety. The patient's presenting symptoms, physical exam findings, and initial radiographic and laboratory data in the context of their chronic comorbidities is felt to place them at high risk for further clinical deterioration. Furthermore, it is not anticipated that the patient will be medically stable for discharge from the hospital within 2 midnights  of admission.   * I certify that at the point of admission it is my clinical judgment that the patient will require inpatient hospital care spanning beyond 2 midnights from the point of admission due to high intensity of service, high risk for further deterioration and high frequency of surveillance required.*  Author: Clydie Braun, MD 05/24/2022 11:14 AM  For on call review [email or web address].

## 2022-05-24 NOTE — ED Triage Notes (Signed)
Pt states she fell off the wagon drinking a fifth a day for last couple of weeks. Last drink was yesterday. Pt is not diaphoretic, no headache, no nausea.

## 2022-05-24 NOTE — ED Notes (Signed)
Pt provided with meal and drink.

## 2022-05-25 DIAGNOSIS — R112 Nausea with vomiting, unspecified: Secondary | ICD-10-CM | POA: Diagnosis not present

## 2022-05-25 DIAGNOSIS — F10932 Alcohol use, unspecified with withdrawal with perceptual disturbance: Secondary | ICD-10-CM | POA: Diagnosis not present

## 2022-05-25 DIAGNOSIS — I480 Paroxysmal atrial fibrillation: Secondary | ICD-10-CM | POA: Diagnosis not present

## 2022-05-25 DIAGNOSIS — Z8709 Personal history of other diseases of the respiratory system: Secondary | ICD-10-CM | POA: Diagnosis not present

## 2022-05-25 DIAGNOSIS — Z86711 Personal history of pulmonary embolism: Secondary | ICD-10-CM | POA: Diagnosis not present

## 2022-05-25 LAB — COMPREHENSIVE METABOLIC PANEL
ALT: 32 U/L (ref 0–44)
AST: 40 U/L (ref 15–41)
Albumin: 3 g/dL — ABNORMAL LOW (ref 3.5–5.0)
Alkaline Phosphatase: 96 U/L (ref 38–126)
Anion gap: 9 (ref 5–15)
BUN: 18 mg/dL (ref 8–23)
CO2: 26 mmol/L (ref 22–32)
Calcium: 8.6 mg/dL — ABNORMAL LOW (ref 8.9–10.3)
Chloride: 102 mmol/L (ref 98–111)
Creatinine, Ser: 1.22 mg/dL — ABNORMAL HIGH (ref 0.44–1.00)
GFR, Estimated: 50 mL/min — ABNORMAL LOW (ref >60)
Glucose, Bld: 110 mg/dL — ABNORMAL HIGH (ref 70–99)
Potassium: 3.2 mmol/L — ABNORMAL LOW (ref 3.5–5.1)
Sodium: 137 mmol/L (ref 135–145)
Total Bilirubin: 1.4 mg/dL — ABNORMAL HIGH (ref 0.3–1.2)
Total Protein: 6.7 g/dL (ref 6.5–8.1)

## 2022-05-25 LAB — CBC
HCT: 40.7 % (ref 36.0–46.0)
Hemoglobin: 13.6 g/dL (ref 12.0–15.0)
MCH: 30.7 pg (ref 26.0–34.0)
MCHC: 33.4 g/dL (ref 30.0–36.0)
MCV: 91.9 fL (ref 80.0–100.0)
Platelets: 233 10*3/uL (ref 150–400)
RBC: 4.43 MIL/uL (ref 3.87–5.11)
RDW: 14.1 % (ref 11.5–15.5)
WBC: 10.2 10*3/uL (ref 4.0–10.5)
nRBC: 0.3 % — ABNORMAL HIGH (ref 0.0–0.2)

## 2022-05-25 LAB — GLUCOSE, CAPILLARY
Glucose-Capillary: 158 mg/dL — ABNORMAL HIGH (ref 70–99)
Glucose-Capillary: 123 mg/dL — ABNORMAL HIGH (ref 70–99)
Glucose-Capillary: 185 mg/dL — ABNORMAL HIGH (ref 70–99)

## 2022-05-25 LAB — MAGNESIUM: Magnesium: 1.7 mg/dL (ref 1.7–2.4)

## 2022-05-25 LAB — PHOSPHORUS: Phosphorus: 3.3 mg/dL (ref 2.5–4.6)

## 2022-05-25 MED ORDER — KETOROLAC TROMETHAMINE 30 MG/ML IJ SOLN
15.0000 mg | Freq: Once | INTRAMUSCULAR | Status: AC
  Administered 2022-05-25: 15 mg via INTRAVENOUS
  Filled 2022-05-25: qty 1

## 2022-05-25 MED ORDER — POTASSIUM CHLORIDE CRYS ER 20 MEQ PO TBCR
40.0000 meq | EXTENDED_RELEASE_TABLET | Freq: Four times a day (QID) | ORAL | Status: AC
  Administered 2022-05-25 (×2): 40 meq via ORAL
  Filled 2022-05-25 (×2): qty 2

## 2022-05-25 MED ORDER — METOPROLOL SUCCINATE ER 25 MG PO TB24
25.0000 mg | ORAL_TABLET | Freq: Every day | ORAL | Status: DC
  Administered 2022-05-25 – 2022-05-29 (×5): 25 mg via ORAL
  Filled 2022-05-25 (×5): qty 1

## 2022-05-25 MED ORDER — INSULIN ASPART 100 UNIT/ML IJ SOLN
0.0000 [IU] | Freq: Three times a day (TID) | INTRAMUSCULAR | Status: DC
  Administered 2022-05-25 – 2022-05-29 (×5): 1 [IU] via SUBCUTANEOUS

## 2022-05-25 MED ORDER — MAGNESIUM SULFATE 2 GM/50ML IV SOLN
2.0000 g | Freq: Once | INTRAVENOUS | Status: AC
  Administered 2022-05-25: 2 g via INTRAVENOUS
  Filled 2022-05-25: qty 50

## 2022-05-25 MED ORDER — ACETAMINOPHEN 500 MG PO TABS
1000.0000 mg | ORAL_TABLET | Freq: Three times a day (TID) | ORAL | Status: DC | PRN
  Administered 2022-05-25 – 2022-05-28 (×2): 1000 mg via ORAL
  Filled 2022-05-25 (×2): qty 2

## 2022-05-25 MED ORDER — APIXABAN 5 MG PO TABS
5.0000 mg | ORAL_TABLET | Freq: Two times a day (BID) | ORAL | Status: DC
  Administered 2022-05-25 – 2022-05-29 (×9): 5 mg via ORAL
  Filled 2022-05-25 (×9): qty 1

## 2022-05-25 NOTE — Evaluation (Signed)
Physical Therapy Evaluation Patient Details Name: [name] MRN: [id number] DOB: 11-28-1960 Today's Date: 05/25/2022  History of Present Illness  62 yo female admitted 4/13 with ETOH withdrawal, tremors, hallucinations, Vtach 4/13. PMhx: HTN, HLD, PE, prediabetes, ETOH abuse, Lt TKA, vocal cord disorder, morbid obesity  Clinical Impression  Pt pleasant, confused with tremors noted during gait. Pt with decreased awareness, processing, balance, gait and functional mobility who will benefit from acute therapy to maximize mobility and safety. Pt lives with spouse and they both work, she does not have additional assist. Pt will benefit from acute therapy to maximize mobility, safety and function to decrease burden of care.         Recommendations for follow up therapy are one component of a multi-disciplinary discharge planning process, led by the attending physician.  Recommendations may be updated based on patient status, additional functional criteria and insurance authorization.  Follow Up Recommendations       Assistance Recommended at Discharge Intermittent Supervision/Assistance  Patient can return home with the following  A little help with walking and/or transfers;A little help with bathing/dressing/bathroom;Assistance with cooking/housework;Assist for transportation    Equipment Recommendations Rolling walker (2 wheels)  Recommendations for Other Services       Functional Status Assessment Patient has had a recent decline in their functional status and demonstrates the ability to make significant improvements in function in a reasonable and predictable amount of time.     Precautions / Restrictions Precautions Precautions: Fall;Other (comment) Precaution Comments: watch HR      Mobility  Bed Mobility Overal bed mobility: Needs Assistance Bed Mobility: Supine to Sit     Supine to sit: Supervision     General bed mobility comments: supervision for safety     Transfers Overall transfer level: Needs assistance   Transfers: Sit to/from Stand Sit to Stand: Min guard           General transfer comment: guarding with tactile cues to rise from bed and toilet, cues for backing to surface    Ambulation/Gait Ambulation/Gait assistance: Min guard Gait Distance (Feet): 150 Feet Assistive device: Rolling walker (2 wheels) Gait Pattern/deviations: Step-through pattern, Decreased stride length, Trunk flexed, Wide base of support   Gait velocity interpretation: <1.8 ft/sec, indicate of risk for recurrent falls   General Gait Details: pt with wide BOS, reliance on RW and mod cues for direction despite giving directional cues prior to initiation of gait as pt unable to recall them and running into objects during gait at least 5x despite cues to attend to environment  Stairs            Wheelchair Mobility    Modified Rankin (Stroke Patients Only)       Balance Overall balance assessment: Needs assistance   Sitting balance-Leahy Scale: Fair     Standing balance support: Bilateral upper extremity supported, Reliant on assistive device for balance, No upper extremity supported, During functional activity Standing balance-Leahy Scale: Fair Standing balance comment: can stand at sink unsupported, reliant on RW with gait                             Pertinent Vitals/Pain Pain Assessment Pain Assessment: No/denies pain    Home Living Family/patient expects to be discharged to:: Private residence Living Arrangements: Spouse/significant other Available Help at Discharge: Family;Available PRN/intermittently Type of Home: House Home Access: Stairs to enter Entrance Stairs-Rails: Right;Left Entrance Stairs-Number of Steps: 3   Home  Layout: One level Home Equipment: Shower seat      Prior Function Prior Level of Function : Independent/Modified Independent                     Hand Dominance         Extremity/Trunk Assessment   Upper Extremity Assessment Upper Extremity Assessment: Generalized weakness    Lower Extremity Assessment Lower Extremity Assessment: Generalized weakness    Cervical / Trunk Assessment Cervical / Trunk Assessment: Kyphotic;Other exceptions Cervical / Trunk Exceptions: increased body habitus  Communication   Communication: No difficulties  Cognition Arousal/Alertness: Awake/alert Behavior During Therapy: Flat affect Overall Cognitive Status: Impaired/Different from baseline Area of Impairment: Orientation, Safety/judgement                 Orientation Level: Disoriented to, Time       Safety/Judgement: Decreased awareness of safety, Decreased awareness of deficits     General Comments: pt frequently running into objects with RW despite cues, stating year as "13"        General Comments      Exercises     Assessment/Plan    PT Assessment Patient needs continued PT services  PT Problem List Decreased strength;Decreased coordination;Decreased cognition;Decreased activity tolerance;Decreased balance;Decreased mobility;Decreased knowledge of use of DME;Obesity       PT Treatment Interventions DME instruction;Therapeutic exercise;Gait training;Balance training;Stair training;Functional mobility training;Therapeutic activities;Patient/family education;Cognitive remediation    PT Goals (Current goals can be found in the Care Plan section)  Acute Rehab PT Goals Patient Stated Goal: return to work PT Goal Formulation: With patient Time For Goal Achievement: 06/08/22 Potential to Achieve Goals: Good    Frequency Min 2X/week     Co-evaluation               AM-PAC PT "6 Clicks" Mobility  Outcome Measure Help needed turning from your back to your side while in a flat bed without using bedrails?: None Help needed moving from lying on your back to sitting on the side of a flat bed without using bedrails?: A Little Help needed  moving to and from a bed to a chair (including a wheelchair)?: A Little Help needed standing up from a chair using your arms (e.g., wheelchair or bedside chair)?: A Little Help needed to walk in hospital room?: A Little Help needed climbing 3-5 steps with a railing? : A Lot 6 Click Score: 18    End of Session   Activity Tolerance: Patient tolerated treatment well Patient left: in chair;with call bell/phone within reach;with chair alarm set Nurse Communication: Mobility status PT Visit Diagnosis: Other abnormalities of gait and mobility (R26.89);Difficulty in walking, not elsewhere classified (R26.2)    Time: 7948-0165 PT Time Calculation (min) (ACUTE ONLY): 28 min   Charges:   PT Evaluation $PT Eval Moderate Complexity: 1 Mod PT Treatments $Therapeutic Activity: 8-22 mins        Merryl Hacker, PT Acute Rehabilitation Services Office: [phone]   Enedina Finner Ajooni Karam 05/25/2022, 1:21 PM

## 2022-05-25 NOTE — Progress Notes (Signed)
PROGRESS NOTE        PATIENT DETAILS Name: [name] Age: 62 y.o. Sex: female Date of Birth: August 11, 1960 Admit Date: 05/24/2022 Admitting Physician Clydie Braun, MD ZOX:[id number], Len Blalock, MD  Brief Summary: Patient is a 62 y.o.  female with history of HTN, HLD, VTE, EtOH use, morbid obesity-who presented with alcohol withdrawal.  Significant events: 4/13>> admit to [facility]  Significant studies: None  Significant microbiology data: None  Procedures: None  Consults: PCCM  Subjective: Lying comfortably in bed-denies any chest pain or shortness of breath.  Feels better.  Minimal tremors.  Awake/alert.  Acknowledges that she is an alcoholic and drinks EtOH almost on daily basis.  Objective: Vitals: Blood pressure 127/75, pulse 92, temperature (!) 97.5 F (36.4 C), temperature source Oral, resp. rate 14, height  (1.549 m), weight 127 kg, SpO2 91 %.   Exam: Gen Exam:Alert awake-not in any distress HEENT:atraumatic, normocephalic Chest: B/L clear to auscultation anteriorly CVS:S1S2 regular Abdomen:soft non tender, non distended Extremities:no edema Neurology: Non focal Skin: no rash  Pertinent Labs/Radiology:    Latest Ref Rng & Units 05/25/2022    3:55 AM 05/24/2022    5:44 AM 03/24/2022    2:47 PM  CBC  WBC 4.0 - 10.5 K/uL 10.2  10.4  14.8   Hemoglobin 12.0 - 15.0 g/dL 45.4  09.8  11.9   Hematocrit 36.0 - 46.0 % 40.7  44.2  38.4   Platelets 150 - 400 K/uL 233  300  325.0     Lab Results  Component Value Date   NA 137 05/25/2022   K 3.2 (L) 05/25/2022   CL 102 05/25/2022   CO2 26 05/25/2022      Assessment/Plan: EtOH withdrawal Mentation much better Minimal withdrawals this morning Ativan per CIWA protocol Phenobarb taper Counseled-will need ongoing counseling prior to discharge  Abdominal pain with nausea/vomiting Seems to have resolved Benign abdominal exam Lipase normal Watch closely-if reoccurs-will need  imaging  Minimally elevated transaminases Has normalized Follow periodically  Hypokalemia Replete/recheck  VTE Eliquis  PAF Sinus rhythm Metoprolol/Eliquis  HTN BP stable Metoprolol/losartan Resume HCTZ when able.  DM-2 (A1c 6.1 on 2/12) CBGs stable on SSI  No results for input(s): "GLUCAP" in the last 72 hours.   Bronchial asthma Stable-not in exacerbation Bronchodilators  Probable OSA Sleep study as outpatient  GERD Pepcid  Morbid Obesity Estimated body mass index is 52.91 kg/m as calculated from the following:   Height as of this encounter:  (1.549 m).   Weight as of this encounter: 127 kg.   Code status:   Code Status: Full Code   DVT Prophylaxis: Eliquis  Family Communication: None at bedside   Disposition Plan: Status is: Inpatient Remains inpatient appropriate because: Severity of illness   Planned Discharge Destination:Home   Diet: Diet Order             Diet Heart Room service appropriate? Yes; Fluid consistency: Thin  Diet effective now                     Antimicrobial agents: Anti-infectives (From admission, onward)    None        MEDICATIONS: Scheduled Meds:  folic acid  1 mg Oral Daily   losartan  100 mg Oral Daily   metoprolol tartrate  2.5 mg Intravenous Q6H  multivitamin with minerals  1 tablet Oral Daily   PHENObarbital  97.5 mg Intravenous Q8H   Followed by   [START ON 05/26/2022] PHENObarbital  65 mg Intravenous Q8H   Followed by   Melene Muller ON 05/28/2022] PHENObarbital  32.5 mg Intravenous Q8H   potassium chloride  40 mEq Oral Q6H   sodium chloride flush  3 mL Intravenous Q12H   thiamine  100 mg Oral Daily   Or   thiamine  100 mg Intravenous Daily   Continuous Infusions:  sodium chloride 100 mL/hr at 05/25/22 0058   magnesium sulfate bolus IVPB 2 g (05/25/22 0934)   PRN Meds:.acetaminophen **OR** acetaminophen, albuterol, chlordiazePOXIDE, loperamide, LORazepam, LORazepam **OR** LORazepam,  ondansetron **OR** ondansetron (ZOFRAN) IV   I have personally reviewed following labs and imaging studies  LABORATORY DATA: CBC: Recent Labs  Lab 05/24/22 0544 05/25/22 0355  WBC 10.4 10.2  HGB 14.2 13.6  HCT 44.2 40.7  MCV 94.6 91.9  PLT 300 233    Basic Metabolic Panel: Recent Labs  Lab 05/24/22 0544 05/24/22 1640 05/25/22 0355  NA 140  --  137  K 3.8  --  3.2*  CL 104  --  102  CO2 19*  --  26  GLUCOSE 108*  --  110*  BUN 19  --  18  CREATININE 0.98  --  1.22*  CALCIUM 9.1  --  8.6*  MG  --  1.6* 1.7  PHOS  --   --  3.3    GFR: Estimated Creatinine Clearance: 60.8 mL/min (A) (by C-G formula based on SCr of 1.22 mg/dL (H)).  Liver Function Tests: Recent Labs  Lab 05/24/22 0544 05/25/22 0355  AST 56* 40  ALT 39 32  ALKPHOS 108 96  BILITOT 0.8 1.4*  PROT 7.7 6.7  ALBUMIN 3.5 3.0*   Recent Labs  Lab 05/24/22 0544  LIPASE 27   No results for input(s): "AMMONIA" in the last 168 hours.  Coagulation Profile: No results for input(s): "INR", "PROTIME" in the last 168 hours.  Cardiac Enzymes: No results for input(s): "CKTOTAL", "CKMB", "CKMBINDEX", "TROPONINI" in the last 168 hours.  BNP (last 3 results) Recent Labs    11/06/21 1049  PROBNP 186.0*    Lipid Profile: No results for input(s): "CHOL", "HDL", "LDLCALC", "TRIG", "CHOLHDL", "LDLDIRECT" in the last 72 hours.  Thyroid Function Tests: No results for input(s): "TSH", "T4TOTAL", "FREET4", "T3FREE", "THYROIDAB" in the last 72 hours.  Anemia Panel: No results for input(s): "VITAMINB12", "FOLATE", "FERRITIN", "TIBC", "IRON", "RETICCTPCT" in the last 72 hours.  Urine analysis:    Component Value Date/Time   COLORURINE YELLOW 03/24/2022 1447   APPEARANCEUR Sl Cloudy (A) 03/24/2022 1447   LABSPEC 1.015 03/24/2022 1447   PHURINE 6.0 03/24/2022 1447   GLUCOSEU NEGATIVE 03/24/2022 1447   HGBUR TRACE-LYSED (A) 03/24/2022 1447   BILIRUBINUR NEGATIVE 03/24/2022 1447   BILIRUBINUR negative  09/16/2011 1047   KETONESUR NEGATIVE 03/24/2022 1447   PROTEINUR NEGATIVE 09/04/2021 1716   UROBILINOGEN 0.2 03/24/2022 1447   NITRITE NEGATIVE 03/24/2022 1447   LEUKOCYTESUR SMALL (A) 03/24/2022 1447    Sepsis Labs: Lactic Acid, Venous    Component Value Date/Time   LATICACIDVEN 2.5 (HH) 11/04/2020 1415    MICROBIOLOGY: No results found for this or any previous visit (from the past 240 hour(s)).  RADIOLOGY STUDIES/RESULTS: No results found.   LOS: 1 day   Jeoffrey Massed, MD  [facility]    To contact the attending provider between 7A-7P or the covering provider  during after hours 7P-7A, please log into the web site [email or web address] and access using universal [place] password for that web site. If you do not have the password, please call the hospital operator.  05/25/2022, 9:58 AM

## 2022-05-26 DIAGNOSIS — Z86711 Personal history of pulmonary embolism: Secondary | ICD-10-CM | POA: Diagnosis not present

## 2022-05-26 DIAGNOSIS — F10932 Alcohol use, unspecified with withdrawal with perceptual disturbance: Secondary | ICD-10-CM | POA: Diagnosis not present

## 2022-05-26 DIAGNOSIS — I480 Paroxysmal atrial fibrillation: Secondary | ICD-10-CM | POA: Diagnosis not present

## 2022-05-26 DIAGNOSIS — R112 Nausea with vomiting, unspecified: Secondary | ICD-10-CM | POA: Diagnosis not present

## 2022-05-26 LAB — COMPREHENSIVE METABOLIC PANEL
ALT: 33 U/L (ref 0–44)
AST: 42 U/L — ABNORMAL HIGH (ref 15–41)
Albumin: 2.8 g/dL — ABNORMAL LOW (ref 3.5–5.0)
Alkaline Phosphatase: 83 U/L (ref 38–126)
Anion gap: 9 (ref 5–15)
BUN: 25 mg/dL — ABNORMAL HIGH (ref 8–23)
CO2: 24 mmol/L (ref 22–32)
Calcium: 8.7 mg/dL — ABNORMAL LOW (ref 8.9–10.3)
Chloride: 106 mmol/L (ref 98–111)
Creatinine, Ser: 1.22 mg/dL — ABNORMAL HIGH (ref 0.44–1.00)
GFR, Estimated: 50 mL/min — ABNORMAL LOW (ref >60)
Glucose, Bld: 124 mg/dL — ABNORMAL HIGH (ref 70–99)
Potassium: 3.8 mmol/L (ref 3.5–5.1)
Sodium: 139 mmol/L (ref 135–145)
Total Bilirubin: 0.8 mg/dL (ref 0.3–1.2)
Total Protein: 6.5 g/dL (ref 6.5–8.1)

## 2022-05-26 LAB — GLUCOSE, CAPILLARY
Glucose-Capillary: 117 mg/dL — ABNORMAL HIGH (ref 70–99)
Glucose-Capillary: 173 mg/dL — ABNORMAL HIGH (ref 70–99)
Glucose-Capillary: 122 mg/dL — ABNORMAL HIGH (ref 70–99)
Glucose-Capillary: 161 mg/dL — ABNORMAL HIGH (ref 70–99)

## 2022-05-26 LAB — MAGNESIUM: Magnesium: 2 mg/dL (ref 1.7–2.4)

## 2022-05-26 MED ORDER — ALBUTEROL SULFATE (2.5 MG/3ML) 0.083% IN NEBU: 2.5000 mg | INHALATION_SOLUTION | RESPIRATORY_TRACT | Status: DC | PRN

## 2022-05-26 MED ORDER — ALBUTEROL SULFATE HFA 108 (90 BASE) MCG/ACT IN AERS
2.0000 | INHALATION_SPRAY | RESPIRATORY_TRACT | Status: DC | PRN
  Administered 2022-05-26 – 2022-05-27 (×3): 2 via RESPIRATORY_TRACT
  Filled 2022-05-26: qty 6.7

## 2022-05-26 MED ORDER — PANTOPRAZOLE SODIUM 40 MG PO TBEC
40.0000 mg | DELAYED_RELEASE_TABLET | Freq: Every day | ORAL | Status: DC
  Administered 2022-05-26 – 2022-05-29 (×4): 40 mg via ORAL
  Filled 2022-05-26 (×4): qty 1

## 2022-05-26 MED ORDER — POTASSIUM CHLORIDE CRYS ER 20 MEQ PO TBCR
20.0000 meq | EXTENDED_RELEASE_TABLET | Freq: Once | ORAL | Status: AC
  Administered 2022-05-26: 20 meq via ORAL
  Filled 2022-05-26: qty 1

## 2022-05-26 MED ORDER — ALUM & MAG HYDROXIDE-SIMETH 200-200-20 MG/5ML PO SUSP
30.0000 mL | Freq: Four times a day (QID) | ORAL | Status: DC | PRN
  Administered 2022-05-26 – 2022-05-28 (×3): 30 mL via ORAL
  Filled 2022-05-26 (×3): qty 30

## 2022-05-26 NOTE — Progress Notes (Signed)
PROGRESS NOTE        PATIENT DETAILS Name: [name] Age: 62 y.o. Sex: female Date of Birth: 22-Mar-1960 Admit Date: 05/24/2022 Admitting Physician Clydie Braun, MD [id number], Len Blalock, MD  Brief Summary: Patient is a 62 y.o.  female with history of HTN, HLD, VTE, EtOH use, morbid obesity-who presented with alcohol withdrawal.  Significant events: 4/13>> admit to [facility]  Significant studies: None  Significant microbiology data: None  Procedures: None  Consults: PCCM  Subjective: Claims she has hallucinations last night.  Had some headaches yesterday that is much better today.  No nausea or vomiting.  No abdominal pain.  Objective: Vitals: Blood pressure (!) 146/93, pulse 90, temperature (!) 97.5 F (36.4 C), temperature source Oral, resp. rate 20, height 5\' 1"  (1.549 m), weight 127 kg, SpO2 95 %.   Exam: Gen Exam:Alert awake-not in any distress HEENT:atraumatic, normocephalic Chest: B/L clear to auscultation anteriorly CVS:S1S2 regular Abdomen:soft non tender, non distended Extremities:no edema Neurology: Non focal Skin: no rash  Pertinent Labs/Radiology:    Latest Ref Rng & Units 05/25/2022    3:55 AM 05/24/2022    5:44 AM 03/24/2022    2:47 PM  CBC  WBC 4.0 - 10.5 K/uL 10.2  10.4  14.8   Hemoglobin 12.0 - 15.0 g/dL 81.1  03.1  59.4   Hematocrit 36.0 - 46.0 % 40.7  44.2  38.4   Platelets 150 - 400 K/uL 233  300  325.0     Lab Results  Component Value Date   NA 139 05/26/2022   K 3.8 05/26/2022   CL 106 05/26/2022   CO2 24 05/26/2022      Assessment/Plan: EtOH withdrawal Overall better-claims she had some hallucination last night Minimal withdrawals Phenobarb taper Ativan per CIWA protocol Continue counseling.   Abdominal pain with nausea/vomiting Seems to have resolved Could be gastritis Lipase normal Benign abdominal exam Follow If reoccurs-will need imaging.   Minimally elevated transaminases Has  normalized Follow periodically  Hypokalemia Repleted  VTE Eliquis  PAF Sinus rhythm Metoprolol/Eliquis  HTN BP stable Metoprolol/losartan Resume HCTZ when able.  DM-2 (A1c 6.1 on 2/12) CBGs stable on SSI  Recent Labs    05/25/22 1544 05/25/22 2124 05/26/22 0801  GLUCAP 123* 185* 117*     Bronchial asthma Stable-not in exacerbation Bronchodilators  Probable OSA Sleep study as outpatient  GERD Pepcid  Morbid Obesity Estimated body mass index is 52.91 kg/m as calculated from the following:   Height as of this encounter: 5\' 1"  (1.549 m).   Weight as of this encounter: 127 kg.   Code status:   Code Status: Full Code   DVT Prophylaxis: Eliquis  Family Communication: None at bedside   Disposition Plan: Status is: Inpatient Remains inpatient appropriate because: Severity of illness   Planned Discharge Destination:Home   Diet: Diet Order             Diet Heart Room service appropriate? Yes; Fluid consistency: Thin  Diet effective now                     Antimicrobial agents: Anti-infectives (From admission, onward)    None        MEDICATIONS: Scheduled Meds:  apixaban  5 mg Oral BID   folic acid  1 mg Oral Daily   insulin aspart  0-6 Units Subcutaneous  TID WC   losartan  100 mg Oral Daily   metoprolol succinate  25 mg Oral Daily   multivitamin with minerals  1 tablet Oral Daily   PHENObarbital  65 mg Intravenous Q8H   Followed by   [START ON 05/28/2022] PHENObarbital  32.5 mg Intravenous Q8H   sodium chloride flush  3 mL Intravenous Q12H   thiamine  100 mg Oral Daily   Or   thiamine  100 mg Intravenous Daily   Continuous Infusions:  sodium chloride 100 mL/hr at 05/26/22 0443   PRN Meds:.acetaminophen, albuterol, chlordiazePOXIDE, loperamide, LORazepam, LORazepam **OR** LORazepam, ondansetron **OR** ondansetron (ZOFRAN) IV   I have personally reviewed following labs and imaging studies  LABORATORY DATA: CBC: Recent Labs   Lab 05/24/22 0544 05/25/22 0355  WBC 10.4 10.2  HGB 14.2 13.6  HCT 44.2 40.7  MCV 94.6 91.9  PLT 300 233     Basic Metabolic Panel: Recent Labs  Lab 05/24/22 0544 05/24/22 1640 05/25/22 0355 05/26/22 0347  NA 140  --  137 139  K 3.8  --  3.2* 3.8  CL 104  --  102 106  CO2 19*  --  26 24  GLUCOSE 108*  --  110* 124*  BUN 19  --  18 25*  CREATININE 0.98  --  1.22* 1.22*  CALCIUM 9.1  --  8.6* 8.7*  MG  --  1.6* 1.7 2.0  PHOS  --   --  3.3  --      GFR: Estimated Creatinine Clearance: 60.8 mL/min (A) (by C-G formula based on SCr of 1.22 mg/dL (H)).  Liver Function Tests: Recent Labs  Lab 05/24/22 0544 05/25/22 0355 05/26/22 0347  AST 56* 40 42*  ALT 39 32 33  ALKPHOS 108 96 83  BILITOT 0.8 1.4* 0.8  PROT 7.7 6.7 6.5  ALBUMIN 3.5 3.0* 2.8*    Recent Labs  Lab 05/24/22 0544  LIPASE 27    No results for input(s): "AMMONIA" in the last 168 hours.  Coagulation Profile: No results for input(s): "INR", "PROTIME" in the last 168 hours.  Cardiac Enzymes: No results for input(s): "CKTOTAL", "CKMB", "CKMBINDEX", "TROPONINI" in the last 168 hours.  BNP (last 3 results) Recent Labs    11/06/21 1049  PROBNP 186.0*     Lipid Profile: No results for input(s): "CHOL", "HDL", "LDLCALC", "TRIG", "CHOLHDL", "LDLDIRECT" in the last 72 hours.  Thyroid Function Tests: No results for input(s): "TSH", "T4TOTAL", "FREET4", "T3FREE", "THYROIDAB" in the last 72 hours.  Anemia Panel: No results for input(s): "VITAMINB12", "FOLATE", "FERRITIN", "TIBC", "IRON", "RETICCTPCT" in the last 72 hours.  Urine analysis:    Component Value Date/Time   COLORURINE YELLOW 03/24/2022 1447   APPEARANCEUR Sl Cloudy (A) 03/24/2022 1447   LABSPEC 1.015 03/24/2022 1447   PHURINE 6.0 03/24/2022 1447   GLUCOSEU NEGATIVE 03/24/2022 1447   HGBUR TRACE-LYSED (A) 03/24/2022 1447   BILIRUBINUR NEGATIVE 03/24/2022 1447   BILIRUBINUR negative 09/16/2011 1047   KETONESUR NEGATIVE  03/24/2022 1447   PROTEINUR NEGATIVE 09/04/2021 1716   UROBILINOGEN 0.2 03/24/2022 1447   NITRITE NEGATIVE 03/24/2022 1447   LEUKOCYTESUR SMALL (A) 03/24/2022 1447    Sepsis Labs: Lactic Acid, Venous    Component Value Date/Time   LATICACIDVEN 2.5 (HH) 11/04/2020 1415    MICROBIOLOGY: No results found for this or any previous visit (from the past 240 hour(s)).  RADIOLOGY STUDIES/RESULTS: No results found.   LOS: 2 days   Jeoffrey Massed, MD  [facility]  To contact the attending provider between 7A-7P or the covering provider during after hours 7P-7A, please log into the web site [email or web address] and access using universal [place] password for that web site. If you do not have the password, please call the hospital operator.  05/26/2022, 8:48 AM

## 2022-05-26 NOTE — Progress Notes (Signed)
Mobility Specialist - Progress Note   05/26/22 1221  Mobility  Activity Ambulated with assistance in hallway  Level of Assistance Contact guard assist, steadying assist  Assistive Device Front wheel walker  Distance Ambulated (ft) 185 ft  Activity Response Tolerated well  Mobility Referral Yes  $Mobility charge 1 Mobility    Pre-mobility:  94%SpO2 During mobility:117 HR  Pt was received in chair and agreeable to mobility. Pt requested to use BR before hallway ambulation. Pt with successful void. Pt required x3 standing rest break and x1 seated rest breaks d/t general fatigue and deconditioning. Pt was returned to chair with all needs met and chair alarm on.   Alda Lea  Mobility Specialist Please contact via Special educational needs teacher or Rehab office at [phone]

## 2022-05-26 NOTE — Plan of Care (Signed)

## 2022-05-26 NOTE — Progress Notes (Signed)
Mobility Specialist - Progress Note   05/26/22 1047  Mobility  Activity Refused mobility   Pt was received sleep and chair and hard to arouse. Will follow up as time permits.   Alda Lea  Mobility Specialist Please contact via Special educational needs teacher or Rehab office at [phone]

## 2022-05-26 NOTE — Care Management (Signed)
CM attempted to complete Initial Assessment at bedside- Patient was sound asleep and did  not wake to call- Will re-attempt at a later time

## 2022-05-27 DIAGNOSIS — F10932 Alcohol use, unspecified with withdrawal with perceptual disturbance: Secondary | ICD-10-CM | POA: Diagnosis not present

## 2022-05-27 DIAGNOSIS — I480 Paroxysmal atrial fibrillation: Secondary | ICD-10-CM | POA: Diagnosis not present

## 2022-05-27 DIAGNOSIS — Z86711 Personal history of pulmonary embolism: Secondary | ICD-10-CM | POA: Diagnosis not present

## 2022-05-27 DIAGNOSIS — R112 Nausea with vomiting, unspecified: Secondary | ICD-10-CM | POA: Diagnosis not present

## 2022-05-27 LAB — BASIC METABOLIC PANEL
Anion gap: 6 (ref 5–15)
BUN: 18 mg/dL (ref 8–23)
CO2: 24 mmol/L (ref 22–32)
Calcium: 8.7 mg/dL — ABNORMAL LOW (ref 8.9–10.3)
Chloride: 109 mmol/L (ref 98–111)
Creatinine, Ser: 1.07 mg/dL — ABNORMAL HIGH (ref 0.44–1.00)
GFR, Estimated: 59 mL/min — ABNORMAL LOW (ref >60)
Glucose, Bld: 130 mg/dL — ABNORMAL HIGH (ref 70–99)
Potassium: 3.9 mmol/L (ref 3.5–5.1)
Sodium: 139 mmol/L (ref 135–145)

## 2022-05-27 LAB — GLUCOSE, CAPILLARY
Glucose-Capillary: 114 mg/dL — ABNORMAL HIGH (ref 70–99)
Glucose-Capillary: 104 mg/dL — ABNORMAL HIGH (ref 70–99)
Glucose-Capillary: 167 mg/dL — ABNORMAL HIGH (ref 70–99)
Glucose-Capillary: 145 mg/dL — ABNORMAL HIGH (ref 70–99)

## 2022-05-27 LAB — MAGNESIUM: Magnesium: 2.1 mg/dL (ref 1.7–2.4)

## 2022-05-27 NOTE — Progress Notes (Signed)
PROGRESS NOTE        PATIENT DETAILS Name: [name] Age: 62 y.o. Sex: female Date of Birth: 1961/01/22 Admit Date: 05/24/2022 Admitting Physician Clydie Braun, MD [id number], Len Blalock, MD  Brief Summary: Patient is a 62 y.o.  female with history of HTN, HLD, VTE, EtOH use, morbid obesity-who presented with alcohol withdrawal.  Significant events: 4/13>> admit to [facility]  Significant studies: None  Significant microbiology data: None  Procedures: None  Consults: PCCM  Subjective: Awake and alert open some tremors continue.  Did not have hallucinations last night.  Objective: Vitals: Blood pressure (!) 142/93, pulse 94, temperature 98 F (36.7 C), temperature source Oral, resp. rate (!) 23, height 5\' 1"  (1.549 m), weight 127 kg, SpO2 95 %.   Exam: Gen Exam:Alert awake-not in any distress HEENT:atraumatic, normocephalic Chest: B/L clear to auscultation anteriorly CVS:S1S2 regular Abdomen:soft non tender, non distended Extremities:no edema Neurology: Non focal Skin: no rash  Pertinent Labs/Radiology:    Latest Ref Rng & Units 05/25/2022    3:55 AM 05/24/2022    5:44 AM 03/24/2022    2:47 PM  CBC  WBC 4.0 - 10.5 K/uL 10.2  10.4  14.8   Hemoglobin 12.0 - 15.0 g/dL 82.9  56.2  13.0   Hematocrit 36.0 - 46.0 % 40.7  44.2  38.4   Platelets 150 - 400 K/uL 233  300  325.0     Lab Results  Component Value Date   NA 139 05/27/2022   K 3.9 05/27/2022   CL 109 05/27/2022   CO2 24 05/27/2022      Assessment/Plan: EtOH withdrawal Improving-no further hallucinations Continues to have some tremors Remains on phenobarb taper Ativan as needed Counseled extensively Child psychotherapist evaluation for outpatient resources/counseling prior to discharge.  Abdominal pain with nausea/vomiting Resolved Lipase normal Abdominal exam normal PPI  If reoccurs-will need imaging.   Minimally elevated transaminases Has normalized Follow  periodically  Hypokalemia Repleted  VTE Eliquis  PAF Sinus rhythm Metoprolol/Eliquis  HTN BP stable Metoprolol/losartan Resume HCTZ when able.  DM-2 (A1c 6.1 on 2/12) CBGs stable on SSI  Recent Labs    05/26/22 1629 05/26/22 2008 05/27/22 0758  GLUCAP 122* 161* 114*      Bronchial asthma Stable-not in exacerbation Bronchodilators  Probable OSA Sleep study as outpatient  GERD PPI  Morbid Obesity Estimated body mass index is 52.91 kg/m as calculated from the following:   Height as of this encounter: 5\' 1"  (1.549 m).   Weight as of this encounter: 127 kg.   Code status:   Code Status: Full Code   DVT Prophylaxis: Eliquis  Family Communication: None at bedside   Disposition Plan: Status is: Inpatient Remains inpatient appropriate because: Severity of illness   Planned Discharge Destination:Home in the next 24 to 48 hours.   Diet: Diet Order             Diet Heart Room service appropriate? Yes; Fluid consistency: Thin  Diet effective now                     Antimicrobial agents: Anti-infectives (From admission, onward)    None        MEDICATIONS: Scheduled Meds:  apixaban  5 mg Oral BID   folic acid  1 mg Oral Daily   insulin aspart  0-6 Units Subcutaneous TID  WC   losartan  100 mg Oral Daily   metoprolol succinate  25 mg Oral Daily   multivitamin with minerals  1 tablet Oral Daily   pantoprazole  40 mg Oral Daily   PHENObarbital  65 mg Intravenous Q8H   Followed by   [START ON 05/28/2022] PHENObarbital  32.5 mg Intravenous Q8H   sodium chloride flush  3 mL Intravenous Q12H   thiamine  100 mg Oral Daily   Or   thiamine  100 mg Intravenous Daily   Continuous Infusions:   PRN Meds:.acetaminophen, albuterol, alum & mag hydroxide-simeth, chlordiazePOXIDE, loperamide, LORazepam, ondansetron **OR** ondansetron (ZOFRAN) IV   I have personally reviewed following labs and imaging studies  LABORATORY DATA: CBC: Recent Labs   Lab 05/24/22 0544 05/25/22 0355  WBC 10.4 10.2  HGB 14.2 13.6  HCT 44.2 40.7  MCV 94.6 91.9  PLT 300 233     Basic Metabolic Panel: Recent Labs  Lab 05/24/22 0544 05/24/22 1640 05/25/22 0355 05/26/22 0347 05/27/22 0334  NA 140  --  137 139 139  K 3.8  --  3.2* 3.8 3.9  CL 104  --  102 106 109  CO2 19*  --  GLUCOSE 108*  --  110* 124* 130*  BUN 19  --  18 25* 18  CREATININE 0.98  --  1.22* 1.22* 1.07*  CALCIUM 9.1  --  8.6* 8.7* 8.7*  MG  --  1.6* 1.7 2.0 2.1  PHOS  --   --  3.3  --   --      GFR: Estimated Creatinine Clearance: 69.3 mL/min (A) (by C-G formula based on SCr of 1.07 mg/dL (H)).  Liver Function Tests: Recent Labs  Lab 05/24/22 0544 05/25/22 0355 05/26/22 0347  AST 56* 40 42*  ALT 39 32 33  ALKPHOS 108 96 83  BILITOT 0.8 1.4* 0.8  PROT 7.7 6.7 6.5  ALBUMIN 3.5 3.0* 2.8*    Recent Labs  Lab 05/24/22 0544  LIPASE 27    No results for input(s): "AMMONIA" in the last 168 hours.  Coagulation Profile: No results for input(s): "INR", "PROTIME" in the last 168 hours.  Cardiac Enzymes: No results for input(s): "CKTOTAL", "CKMB", "CKMBINDEX", "TROPONINI" in the last 168 hours.  BNP (last 3 results) Recent Labs    11/06/21 1049  PROBNP 186.0*     Lipid Profile: No results for input(s): "CHOL", "HDL", "LDLCALC", "TRIG", "CHOLHDL", "LDLDIRECT" in the last 72 hours.  Thyroid Function Tests: No results for input(s): "TSH", "T4TOTAL", "FREET4", "T3FREE", "THYROIDAB" in the last 72 hours.  Anemia Panel: No results for input(s): "VITAMINB12", "FOLATE", "FERRITIN", "TIBC", "IRON", "RETICCTPCT" in the last 72 hours.  Urine analysis:    Component Value Date/Time   COLORURINE YELLOW 03/24/2022 1447   APPEARANCEUR Sl Cloudy (A) 03/24/2022 1447   LABSPEC 1.015 03/24/2022 1447   PHURINE 6.0 03/24/2022 1447   GLUCOSEU NEGATIVE 03/24/2022 1447   HGBUR TRACE-LYSED (A) 03/24/2022 1447   BILIRUBINUR NEGATIVE 03/24/2022 1447    BILIRUBINUR negative 09/16/2011 1047   KETONESUR NEGATIVE 03/24/2022 1447   PROTEINUR NEGATIVE 09/04/2021 1716   UROBILINOGEN 0.2 03/24/2022 1447   NITRITE NEGATIVE 03/24/2022 1447   LEUKOCYTESUR SMALL (A) 03/24/2022 1447    Sepsis Labs: Lactic Acid, Venous    Component Value Date/Time   LATICACIDVEN 2.5 (HH) 11/04/2020 1415    MICROBIOLOGY: No results found for this or any previous visit (from the past 240 hour(s)).  RADIOLOGY STUDIES/RESULTS: No results found.  LOS: 3 days   Jeoffrey Massed, MD  [facility]    To contact the attending provider between 7A-7P or the covering provider during after hours 7P-7A, please log into the web site [email or web address] and access using universal [place] password for that web site. If you do not have the password, please call the hospital operator.  05/27/2022, 9:49 AM

## 2022-05-27 NOTE — TOC Progression Note (Signed)
Transition of Care [facility]) - Progression Note    Patient Details  Name: [name] MRN: [id number] Date of Birth: 08-30-1960  [facility]) CM/SW Contact  Helene Kelp, [place] Phone Number: 05/27/2022, 3:24 PM  Clinical Narrative:    CSW met with the patient at the bedside to provide and review community based SA resources. This Clinical research associate reviewed resources with the patient and answered inquires noted by the patient. The patient discussed her history and struggles with alcohol dependence/abuse. The patient expressed not having good natural supports to discontinue or to have moderation when drinking alcohol. This Clinical research associate communicated with the patient from a strength based prospective, which appeared to help the patient as she considered to use SA resources post discharge.    No other needs at this  time identified, outside the current disposition for the patient. Unit CSW to continue to follow and provide support.   Expected Discharge Plan and Services     Social Determinants of Health (SDOH) Interventions SDOH Screenings   Food Insecurity: Food Insecurity Present (09/12/2021)  Housing: Medium Risk (11/07/2021)  Transportation Needs: No Transportation Needs (09/12/2021)  Utilities: Not At Risk (11/11/2021)  Recent Concern: Utilities - At Risk (11/07/2021)  Depression (PHQ2-9): Low Risk  (03/24/2022)  Financial Resource Strain: Medium Risk (11/07/2021)  Tobacco Use: Medium Risk (05/24/2022)    Readmission Risk Interventions     No data to display

## 2022-05-27 NOTE — Progress Notes (Addendum)
PT Cancellation Note  Patient Details Name: [name] MRN: [id number] DOB: [birth year]   Cancelled Treatment:    Reason Eval/Treat Not Completed: (P) Other (comment) (pt requesting to finish lunch tray prior to session.) Will check back as schedule allows to continue with PT POC.  3:26 pm:  re attempted, pt declining mobility with c/o nausea, RN aware. Will check back tomorrow to continue with PT POC.  [name]. PTA Acute Rehabilitation Services Office: [phone]    [name] 05/27/2022, 12:43 PM

## 2022-05-27 NOTE — Progress Notes (Signed)
Mobility Specialist - Progress Note   05/27/22 1537  Mobility  Activity Ambulated with assistance in hallway  Level of Assistance Contact guard assist, steadying assist  Assistive Device Front wheel walker  Distance Ambulated (ft) 75 ft  Activity Response Tolerated fair  Mobility Referral Yes  $Mobility charge 1 Mobility    Pre-mobility: 111/80(90) BP Post-mobility: 116/65(79) BP  Pt was received in chair and agreeable to mobility. Session was limited d/t pt feeling wooziness throughout ambulation (BP before and after ambulation listed above). Pt was returned to chair with all needs met. RN aware of session.   [name]  Mobility Specialist Please contact via Special educational needs teacher or Rehab office at [phone]

## 2022-05-27 NOTE — Plan of Care (Signed)

## 2022-05-28 DIAGNOSIS — I480 Paroxysmal atrial fibrillation: Secondary | ICD-10-CM | POA: Diagnosis not present

## 2022-05-28 DIAGNOSIS — F10932 Alcohol use, unspecified with withdrawal with perceptual disturbance: Secondary | ICD-10-CM | POA: Diagnosis not present

## 2022-05-28 DIAGNOSIS — Z8709 Personal history of other diseases of the respiratory system: Secondary | ICD-10-CM | POA: Diagnosis not present

## 2022-05-28 DIAGNOSIS — Z86711 Personal history of pulmonary embolism: Secondary | ICD-10-CM | POA: Diagnosis not present

## 2022-05-28 DIAGNOSIS — R112 Nausea with vomiting, unspecified: Secondary | ICD-10-CM | POA: Diagnosis not present

## 2022-05-28 LAB — GLUCOSE, CAPILLARY
Glucose-Capillary: 135 mg/dL — ABNORMAL HIGH (ref 70–99)
Glucose-Capillary: 108 mg/dL — ABNORMAL HIGH (ref 70–99)
Glucose-Capillary: 159 mg/dL — ABNORMAL HIGH (ref 70–99)
Glucose-Capillary: 166 mg/dL — ABNORMAL HIGH (ref 70–99)

## 2022-05-28 NOTE — Progress Notes (Signed)
PROGRESS NOTE        PATIENT DETAILS Name: [name] Age: 62 y.o. Sex: female Date of Birth: 18-Feb-1960 Admit Date: 05/24/2022 Admitting Physician Clydie Braun, MD [id number], Len Blalock, MD  Brief Summary: Patient is a 62 y.o.  female with history of HTN, HLD, VTE, EtOH use, morbid obesity-who presented with alcohol withdrawal.  Significant events: 4/13>> admit to [facility]  Significant studies: None  Significant microbiology data: None  Procedures: None  Consults: PCCM  Subjective: No issues overnight.  Mentation much better-no hallucinations last night.  No tremors today.    Objective: Vitals: Blood pressure 119/83, pulse 95, temperature 98.3 F (36.8 C), temperature source Oral, resp. rate 19, height 5\' 1"  (1.549 m), weight 127 kg, SpO2 95 %.   Exam: Gen Exam:Alert awake-not in any distress HEENT:atraumatic, normocephalic Chest: B/L clear to auscultation anteriorly CVS:S1S2 regular Abdomen:soft non tender, non distended Extremities:no edema Neurology: Non focal Skin: no rash  Pertinent Labs/Radiology:    Latest Ref Rng & Units 05/25/2022    3:55 AM 05/24/2022    5:44 AM 03/24/2022    2:47 PM  CBC  WBC 4.0 - 10.5 K/uL 10.2  10.4  14.8   Hemoglobin 12.0 - 15.0 g/dL 14.7  82.9  56.2   Hematocrit 36.0 - 46.0 % 40.7  44.2  38.4   Platelets 150 - 400 K/uL 233  300  325.0     Lab Results  Component Value Date   NA 139 05/27/2022   K 3.9 05/27/2022   CL 109 05/27/2022   CO2 24 05/27/2022      Assessment/Plan: EtOH withdrawal Much improved No tremors Will complete tapering phenobarb 4/18 Counseled Likely home 4/18.  Abdominal pain with nausea/vomiting Resolved Lipase normal Abdominal exam normal PPI  If reoccurs-will need imaging.   Minimally elevated transaminases Has normalized Follow periodically  Hypokalemia Repleted  VTE Eliquis  PAF Sinus rhythm Metoprolol/Eliquis  HTN BP  stable Metoprolol/losartan Resume HCTZ when able.  DM-2 (A1c 6.1 on 2/12) CBGs stable on SSI  Recent Labs    05/27/22 1519 05/27/22 2049 05/28/22 0759  GLUCAP 167* 145* 135*      Bronchial asthma Stable-not in exacerbation Bronchodilators  Probable OSA Sleep study as outpatient  GERD PPI  Morbid Obesity Estimated body mass index is 52.91 kg/m as calculated from the following:   Height as of this encounter: 5\' 1"  (1.549 m).   Weight as of this encounter: 127 kg.   Code status:   Code Status: Full Code   DVT Prophylaxis: Eliquis  Family Communication: None at bedside   Disposition Plan: Status is: Inpatient Remains inpatient appropriate because: Severity of illness   Planned Discharge Destination:Home in the next 24 to 48 hours.   Diet: Diet Order             Diet Heart Room service appropriate? Yes; Fluid consistency: Thin  Diet effective now                     Antimicrobial agents: Anti-infectives (From admission, onward)    None        MEDICATIONS: Scheduled Meds:  apixaban  5 mg Oral BID   folic acid  1 mg Oral Daily   insulin aspart  0-6 Units Subcutaneous TID WC   losartan  100 mg Oral Daily   metoprolol succinate  25 mg Oral Daily   multivitamin with minerals  1 tablet Oral Daily   pantoprazole  40 mg Oral Daily   PHENObarbital  32.5 mg Intravenous Q8H   sodium chloride flush  3 mL Intravenous Q12H   thiamine  100 mg Oral Daily   Or   thiamine  100 mg Intravenous Daily   Continuous Infusions:   PRN Meds:.acetaminophen, albuterol, alum & mag hydroxide-simeth, LORazepam, ondansetron **OR** ondansetron (ZOFRAN) IV   I have personally reviewed following labs and imaging studies  LABORATORY DATA: CBC: Recent Labs  Lab 05/24/22 0544 05/25/22 0355  WBC 10.4 10.2  HGB 14.2 13.6  HCT 44.2 40.7  MCV 94.6 91.9  PLT 300 233     Basic Metabolic Panel: Recent Labs  Lab 05/24/22 0544 05/24/22 1640 05/25/22 0355  05/26/22 0347 05/27/22 0334  NA 140  --  137 139 139  K 3.8  --  3.2* 3.8 3.9  CL 104  --  102 106 109  CO2 19*  --  GLUCOSE 108*  --  110* 124* 130*  BUN 19  --  18 25* 18  CREATININE 0.98  --  1.22* 1.22* 1.07*  CALCIUM 9.1  --  8.6* 8.7* 8.7*  MG  --  1.6* 1.7 2.0 2.1  PHOS  --   --  3.3  --   --      GFR: Estimated Creatinine Clearance: 69.3 mL/min (A) (by C-G formula based on SCr of 1.07 mg/dL (H)).  Liver Function Tests: Recent Labs  Lab 05/24/22 0544 05/25/22 0355 05/26/22 0347  AST 56* 40 42*  ALT 39 32 33  ALKPHOS 108 96 83  BILITOT 0.8 1.4* 0.8  PROT 7.7 6.7 6.5  ALBUMIN 3.5 3.0* 2.8*    Recent Labs  Lab 05/24/22 0544  LIPASE 27    No results for input(s): "AMMONIA" in the last 168 hours.  Coagulation Profile: No results for input(s): "INR", "PROTIME" in the last 168 hours.  Cardiac Enzymes: No results for input(s): "CKTOTAL", "CKMB", "CKMBINDEX", "TROPONINI" in the last 168 hours.  BNP (last 3 results) Recent Labs    11/06/21 1049  PROBNP 186.0*     Lipid Profile: No results for input(s): "CHOL", "HDL", "LDLCALC", "TRIG", "CHOLHDL", "LDLDIRECT" in the last 72 hours.  Thyroid Function Tests: No results for input(s): "TSH", "T4TOTAL", "FREET4", "T3FREE", "THYROIDAB" in the last 72 hours.  Anemia Panel: No results for input(s): "VITAMINB12", "FOLATE", "FERRITIN", "TIBC", "IRON", "RETICCTPCT" in the last 72 hours.  Urine analysis:    Component Value Date/Time   COLORURINE YELLOW 03/24/2022 1447   APPEARANCEUR Sl Cloudy (A) 03/24/2022 1447   LABSPEC 1.015 03/24/2022 1447   PHURINE 6.0 03/24/2022 1447   GLUCOSEU NEGATIVE 03/24/2022 1447   HGBUR TRACE-LYSED (A) 03/24/2022 1447   BILIRUBINUR NEGATIVE 03/24/2022 1447   BILIRUBINUR negative 09/16/2011 1047   KETONESUR NEGATIVE 03/24/2022 1447   PROTEINUR NEGATIVE 09/04/2021 1716   UROBILINOGEN 0.2 03/24/2022 1447   NITRITE NEGATIVE 03/24/2022 1447   LEUKOCYTESUR SMALL (A)  03/24/2022 1447    Sepsis Labs: Lactic Acid, Venous    Component Value Date/Time   LATICACIDVEN 2.5 (HH) 11/04/2020 1415    MICROBIOLOGY: No results found for this or any previous visit (from the past 240 hour(s)).  RADIOLOGY STUDIES/RESULTS: No results found.   LOS: 4 days   Jeoffrey Massed, MD  [facility]    To contact the attending provider between 7A-7P or the covering provider during after hours 7P-7A, please  log into the web site [email or web address] and access using universal [place] password for that web site. If you do not have the password, please call the hospital operator.  05/28/2022, 11:23 AM

## 2022-05-28 NOTE — Plan of Care (Signed)
  Problem: Health Behavior/Discharge Planning: Goal: Ability to manage health-related needs will improve Outcome: Progressing   Problem: Clinical Measurements: Goal: Cardiovascular complication will be avoided Outcome: Progressing   Problem: Activity: Goal: Risk for activity intolerance will decrease Outcome: Progressing   Problem: Pain Managment: Goal: General experience of comfort will improve Outcome: Progressing   Problem: Education: Goal: Ability to describe self-care measures that may prevent or decrease complications (Diabetes Survival Skills Education) will improve Outcome: Progressing   Problem: Health Behavior/Discharge Planning: Goal: Ability to identify and utilize available resources and services will improve Outcome: Progressing   Problem: Education: Goal: Ability to describe self-care measures that may prevent or decrease complications (Diabetes Survival Skills Education) will improve Outcome: Progressing   Problem: Health Behavior/Discharge Planning: Goal: Ability to identify and utilize available resources and services will improve Outcome: Progressing   Problem: Metabolic: Goal: Ability to maintain appropriate glucose levels will improve Outcome: Progressing   Problem: Skin Integrity: Goal: Risk for impaired skin integrity will decrease Outcome: Progressing   Problem: Tissue Perfusion: Goal: Adequacy of tissue perfusion will improve Outcome: Progressing

## 2022-05-28 NOTE — Progress Notes (Signed)
Physical Therapy Treatment Patient Details Name: [name] MRN: [id number] DOB: [birth year] Today's Date: 05/28/2022   History of Present Illness 62 yo female admitted 4/13 with ETOH withdrawal, tremors, hallucinations, Vtach 4/13. PMhx: HTN, HLD, PE, prediabetes, ETOH abuse, Lt TKA, vocal cord disorder, morbid obesity    PT Comments    Pt tolerated today's session well, able to progress ambulation in the hallway with use of RW. Pt fatigues with mobility but recovers quickly with standing or seated rest breaks, pt able to identify when rest is needed and HR often matching pt's reports. Pt cued on proper use of RW with mild cues throughout for navigation around obstacles and narrow spaces. Acute PT will continue to follow pt during admission to progress activity tolerance with mobility, discharge plans remain appropriate with RW already delivered to the room.     Recommendations for follow up therapy are one component of a multi-disciplinary discharge planning process, led by the attending physician.  Recommendations may be updated based on patient status, additional functional criteria and insurance authorization.  Follow Up Recommendations       Assistance Recommended at Discharge Intermittent Supervision/Assistance  Patient can return home with the following A little help with walking and/or transfers;A little help with bathing/dressing/bathroom;Assistance with cooking/housework;Assist for transportation;Help with stairs or ramp for entrance   Equipment Recommendations  Rolling walker (2 wheels)    Recommendations for Other Services       Precautions / Restrictions Precautions Precautions: Fall;Other (comment) Precaution Comments: watch HR Restrictions Weight Bearing Restrictions: No     Mobility  Bed Mobility               General bed mobility comments: pt seated in chair upon arrival, ended session in chair    Transfers Overall transfer level: Needs  assistance Equipment used: Rolling walker (2 wheels) Transfers: Sit to/from Stand Sit to Stand: Supervision           General transfer comment: performing trials from arm chair, bed, and toilet, able to perform all with supervision, a few trials without AD, and 1 with RW    Ambulation/Gait Ambulation/Gait assistance: Min guard Gait Distance (Feet): 225 Feet (with ~5 standing rest breaks in the hallway, trials of 15', 12', 15' and 15' performed for toileting trials with no AD) Assistive device: Rolling walker (2 wheels), None Gait Pattern/deviations: Step-through pattern, Decreased stride length, Trunk flexed, Wide base of support, Decreased dorsiflexion - right, Decreased dorsiflexion - left Gait velocity: decreased     General Gait Details: wide BOS with decreased step lengths B, cueing for RW management around obstacles throughout session. Ambulating in room without AD but intermittently reaching for UE support, smoother gait with use of RW. ~5 standing rest breaks required throughout long ambulation trial, with a seated rest break after toileting prior to ambulation due to fatigue, HR elevating to 110-120s, recovering with standing or seated rest breaks   Stairs             Wheelchair Mobility    Modified Rankin (Stroke Patients Only)       Balance Overall balance assessment: Needs assistance Sitting-balance support: No upper extremity supported, Feet supported Sitting balance-Leahy Scale: Good     Standing balance support: Bilateral upper extremity supported, Reliant on assistive device for balance, No upper extremity supported, During functional activity Standing balance-Leahy Scale: Fair Standing balance comment: able to stand statically without UE support but requiring assist for ambulation longer distances and intermittently reaching for UE support  Cognition Arousal/Alertness: Awake/alert Behavior During Therapy: Flat  affect Overall Cognitive Status: Impaired/Different from baseline Area of Impairment: Safety/judgement                         Safety/Judgement: Decreased awareness of deficits, Decreased awareness of safety     General Comments: cognition improved from eval, but continuing to demonstrate some safety impairments with RW management around obstacles, requiring cueing to avoid        Exercises      General Comments General comments (skin integrity, edema, etc.): HR elevating to 110-120s with ambulation and pt fatiguing but recovers well with seated and standing rest breaks      Pertinent Vitals/Pain Pain Assessment Pain Assessment: No/denies pain    Home Living                          Prior Function            PT Goals (current goals can now be found in the care plan section) Acute Rehab PT Goals Patient Stated Goal: return to work PT Goal Formulation: With patient Time For Goal Achievement: 06/08/22 Potential to Achieve Goals: Good Progress towards PT goals: Progressing toward goals    Frequency    Min 2X/week      PT Plan Current plan remains appropriate    Co-evaluation              AM-PAC PT "6 Clicks" Mobility   Outcome Measure  Help needed turning from your back to your side while in a flat bed without using bedrails?: None Help needed moving from lying on your back to sitting on the side of a flat bed without using bedrails?: A Little Help needed moving to and from a bed to a chair (including a wheelchair)?: A Little Help needed standing up from a chair using your arms (e.g., wheelchair or bedside chair)?: A Little Help needed to walk in hospital room?: A Little Help needed climbing 3-5 steps with a railing? : A Lot 6 Click Score: 18    End of Session Equipment Utilized During Treatment: Gait belt Activity Tolerance: Patient tolerated treatment well Patient left: in chair;with call bell/phone within reach Nurse  Communication: Mobility status PT Visit Diagnosis: Other abnormalities of gait and mobility (R26.89);Difficulty in walking, not elsewhere classified (R26.2)     Time: 7829-5621 PT Time Calculation (min) (ACUTE ONLY): 30 min  Charges:  $Gait Training: 8-22 mins $Therapeutic Activity: 8-22 mins                     Lindalou Hose, PT DPT Acute Rehabilitation Services Office [phone]    [name] 05/28/2022, 12:55 PM

## 2022-05-28 NOTE — Progress Notes (Signed)
Mobility Specialist - Progress Note   05/28/22 1141  Mobility  Activity Ambulated with assistance in hallway  Level of Assistance Contact guard assist, steadying assist  Assistive Device Front wheel walker  Distance Ambulated (ft) 100 ft  Activity Response Tolerated well  Mobility Referral Yes  $Mobility charge 1 Mobility    During mobility: 110 HR,96% SpO2 Post-mobility: 95 HR, 95%SpO2  Pt was received in chair and agreeable to mobility. Session was limited d/t pt experiancing foot and back pain rating it 10/10. No faults throughout session. Pt was returned to chair with all needs met. NT present.   Alda Lea  Mobility Specialist Please contact via Special educational needs teacher or Rehab office at [phone]

## 2022-05-29 DIAGNOSIS — Z86711 Personal history of pulmonary embolism: Secondary | ICD-10-CM | POA: Diagnosis not present

## 2022-05-29 DIAGNOSIS — I480 Paroxysmal atrial fibrillation: Secondary | ICD-10-CM | POA: Diagnosis not present

## 2022-05-29 DIAGNOSIS — Z8709 Personal history of other diseases of the respiratory system: Secondary | ICD-10-CM | POA: Diagnosis not present

## 2022-05-29 DIAGNOSIS — F10932 Alcohol use, unspecified with withdrawal with perceptual disturbance: Secondary | ICD-10-CM | POA: Diagnosis not present

## 2022-05-29 LAB — GLUCOSE, CAPILLARY
Glucose-Capillary: 116 mg/dL — ABNORMAL HIGH (ref 70–99)
Glucose-Capillary: 166 mg/dL — ABNORMAL HIGH (ref 70–99)

## 2022-05-29 MED ORDER — LORAZEPAM 0.5 MG PO TABS
0.5000 mg | ORAL_TABLET | Freq: Three times a day (TID) | ORAL | Status: DC | PRN
  Administered 2022-05-29: 0.5 mg via ORAL
  Filled 2022-05-29: qty 1

## 2022-05-29 NOTE — Discharge Summary (Signed)
PATIENT DETAILS Name: [name] Age: 62 y.o. Sex: female Date of Birth: [birth year] MRN: [id number]. Admitting Physician: Clydie Braun, MD WUJ:[id number], Len Blalock, MD  Admit Date: 05/24/2022 Discharge date: 05/29/2022  Recommendations for Outpatient Follow-up:  Follow up with PCP in 1-2 weeks Please obtain CMP/CBC in one week Sleep study as an outpatient  Admitted From:  Home  Disposition: Home   Discharge Condition: fair  CODE STATUS:   Code Status: Full Code   Diet recommendation:  Diet Order             Diet - low sodium heart healthy           Diet Carb Modified           Diet Heart Room service appropriate? Yes; Fluid consistency: Thin  Diet effective now                    Brief Summary: Patient is a 62 y.o.  female with history of HTN, HLD, VTE, EtOH use, morbid obesity-who presented with alcohol withdrawal.   Significant events: 4/13>> admit to [facility]   Significant studies: None   Significant microbiology data: None   Procedures: None   Consults: PCCM  Brief Hospital Course: EtOH withdrawal Much improved No tremors Will complete tapering phenobarb 4/18 Counseled extensively-social worker has provided outpatient resources Stable for home on 4/18.    Abdominal pain with nausea/vomiting Resolved Lipase normal Abdominal exam normal PPI    Minimally elevated transaminases Has normalized Follow periodically via PCP   Hypokalemia Repleted   VTE Eliquis   PAF Sinus rhythm Metoprolol/Eliquis   HTN BP stable Metoprolol/losartan/HCTZ   DM-2 (A1c 6.1 on 2/12) CBGs stable on SSI Continue patient monitoring-diet/lifestyle modification.  Bronchial asthma Stable-not in exacerbation Bronchodilators   Probable OSA Sleep study as outpatient   GERD PPI   Morbid Obesity Estimated body mass index is 52.91 kg/m as calculated from the following:   Height as of this encounter:  (1.549 m).   Weight as of this  encounter: 127 kg.    Discharge Diagnoses:  Principal Problem:   Alcohol withdrawal with perceptual disturbances Active Problems:   Nausea & vomiting   Abdominal pain   Essential hypertension   History of pulmonary embolism   PAF (paroxysmal atrial fibrillation)   Chronic anticoagulation   History of asthma   Elevated AST (SGOT)   Morbid obesity with BMI of 50.0-59.9, adult   Prediabetes   Discharge Instructions:  Activity:  As tolerated  Discharge Instructions     Call MD for:  difficulty breathing, headache or visual disturbances   Complete by: As directed    Call MD for:  extreme fatigue   Complete by: As directed    Diet - low sodium heart healthy   Complete by: As directed    Diet Carb Modified   Complete by: As directed    Discharge instructions   Complete by: As directed    Follow with Primary MD  Corwin Levins, MD in 1-2 weeks  Please get a complete blood count and chemistry panel checked by your Primary MD at your next visit, and again as instructed by your Primary MD.  Get Medicines reviewed and adjusted: Please take all your medications with you for your next visit with your Primary MD  Laboratory/radiological data: Please request your Primary MD to go over all hospital tests and procedure/radiological results at the follow up, please ask your Primary MD to get all  Hospital records sent to his/her office.  In some cases, they will be blood work, cultures and biopsy results pending at the time of your discharge. Please request that your primary care M.D. follows up on these results.  Also Note the following: If you experience worsening of your admission symptoms, develop shortness of breath, life threatening emergency, suicidal or homicidal thoughts you must seek medical attention immediately by calling 911 or calling your MD immediately  if symptoms less severe.  You must read complete instructions/literature along with all the possible adverse  reactions/side effects for all the Medicines you take and that have been prescribed to you. Take any new Medicines after you have completely understood and accpet all the possible adverse reactions/side effects.   Do not drive when taking Pain medications or sleeping medications (Benzodaizepines)  Do not take more than prescribed Pain, Sleep and Anxiety Medications. It is not advisable to combine anxiety,sleep and pain medications without talking with your primary care practitioner  Special Instructions: If you have smoked or chewed Tobacco  in the last 2 yrs please stop smoking, stop any regular Alcohol  and or any Recreational drug use.  Wear Seat belts while driving.  Please note: You were cared for by a hospitalist during your hospital stay. Once you are discharged, your primary care physician will handle any further medical issues. Please note that NO REFILLS for any discharge medications will be authorized once you are discharged, as it is imperative that you return to your primary care physician (or establish a relationship with a primary care physician if you do not have one) for your post hospital discharge needs so that they can reassess your need for medications and monitor your lab values.   Increase activity slowly   Complete by: As directed       Allergies as of 05/29/2022       Reactions   Morphine Itching   Shrimp [shellfish Allergy] Itching, Other (See Comments)   Tongue burns also   Tramadol Other (See Comments)   Caused confusion   Covid-19 (mrna) Vaccine Hives   Diltiazem Hcl Itching   Pt with itching of the feet when bolus given   Latex Rash        Medication List     STOP taking these medications    guaiFENesin-dextromethorphan 100-10 MG/5ML syrup Commonly known as: ROBITUSSIN DM   levocetirizine 5 MG tablet Commonly known as: XYZAL   ondansetron 4 MG disintegrating tablet Commonly known as: ZOFRAN-ODT   rivaroxaban 20 MG Tabs tablet Commonly known  as: XARELTO   thiamine 100 MG tablet Commonly known as: VITAMIN B1       TAKE these medications    albuterol 108 (90 Base) MCG/ACT inhaler Commonly known as: VENTOLIN HFA INHALE 2 PUFFS EVERY 6 HOURS AS NEEDED What changed:  how much to take how to take this when to take this reasons to take this additional instructions   atorvastatin 20 MG tablet Commonly known as: LIPITOR TAKE 1 TABLET BY MOUTH EVERY DAY   calcium carbonate 500 MG chewable tablet Commonly known as: TUMS - dosed in mg elemental calcium Chew 1 tablet by mouth daily as needed for indigestion or heartburn.   Eliquis 5 MG Tabs tablet Generic drug: apixaban Take 5 mg by mouth 2 (two) times daily.   famotidine 20 MG tablet Commonly known as: PEPCID Take 1 tablet (20 mg total) by mouth 2 (two) times daily.   LORazepam 0.5 MG tablet Commonly known as: ATIVAN  TAKE 1 TABLET BY MOUTH 2 TIMES DAILY AS NEEDED FOR ANXIETY.   losartan-hydrochlorothiazide 100-25 MG tablet Commonly known as: HYZAAR Take 1 tablet by mouth daily.   Lumify 0.025 % Soln Generic drug: Brimonidine Tartrate Place 1 drop into both eyes daily as needed (redness).   methocarbamol 500 MG tablet Commonly known as: ROBAXIN TAKE 1 TABLET BY MOUTH 4 TIMES DAILY.   metoprolol succinate 25 MG 24 hr tablet Commonly known as: TOPROL-XL Take 1 tablet (25 mg total) by mouth daily. Appt req for refill [id number]   mometasone-formoterol 100-5 MCG/ACT Aero Commonly known as: DULERA Inhale 2 puffs into the lungs in the morning and at bedtime.   multivitamin with minerals Tabs tablet Take 1 tablet by mouth daily.   pantoprazole 40 MG tablet Commonly known as: Protonix Take 1 tablet (40 mg total) by mouth daily.   triamcinolone cream 0.1 % Commonly known as: KENALOG Apply 1 Application topically 2 (two) times daily as needed (itching).               Durable Medical Equipment  (From admission, onward)           Start      Ordered   05/26/22 1003  For home use only DME Walker rolling  Once       Question Answer Comment  Walker: With 5 Inch Wheels   Patient needs a walker to treat with the following condition Alcohol withdrawal   Patient needs a walker to treat with the following condition Generalized weakness      05/26/22 1004            Follow-up Information     [facility] Follow up.   Specialty: Rehabilitation Why: Please call to schedule your outpatient therapy Contact information: [address] [id number] mc [place] [address] [phone]               Allergies  Allergen Reactions   Morphine Itching   Shrimp [Shellfish Allergy] Itching and Other (See Comments)    Tongue burns also   Tramadol Other (See Comments)    Caused confusion   Covid-19 (Mrna) Vaccine Hives   Diltiazem Hcl Itching    Pt with itching of the feet when bolus given   Latex Rash     Other Procedures/Studies: No results found.   TODAY-DAY OF DISCHARGE:  Subjective:   Reisa Coppola today has no headache,no chest abdominal pain,no new weakness tingling or numbness, feels much better wants to go home today.   Objective:   Blood pressure 129/78, pulse 99, temperature 98.2 F (36.8 C), temperature source Oral, resp. rate 20, height 5\' 1"  (1.549 m), weight 127 kg, SpO2 97 %.  Intake/Output Summary (Last 24 hours) at 05/29/2022 0834 Last data filed at 05/28/2022 1800 Gross per 24 hour  Intake 1260 ml  Output --  Net 1260 ml   Filed Weights   05/24/22 0954  Weight: 127 kg    Exam: Awake Alert, Oriented *3, No new F.N deficits, Normal affect [place].AT,PERRAL Supple Neck,No JVD, No cervical lymphadenopathy appriciated.  Symmetrical Chest wall movement, Good air movement bilaterally, CTAB RRR,No Gallops,Rubs or new Murmurs, No Parasternal Heave +ve B.Sounds, Abd Soft, Non tender, No organomegaly appriciated, No rebound  -guarding or rigidity. No Cyanosis, Clubbing or edema, No new Rash or bruise   PERTINENT RADIOLOGIC STUDIES: No results found.   PERTINENT LAB RESULTS: CBC: No results for input(s): "WBC", "HGB", "HCT", "PLT" in the last  72 hours. CMET CMP     Component Value Date/Time   NA 139 05/27/2022 0334   K 3.9 05/27/2022 0334   CL 109 05/27/2022 0334   CO2 24 05/27/2022 0334   GLUCOSE 130 (H) 05/27/2022 0334   BUN 18 05/27/2022 0334   CREATININE 1.07 (H) 05/27/2022 0334   CALCIUM 8.7 (L) 05/27/2022 0334   PROT 6.5 05/26/2022 0347   ALBUMIN 2.8 (L) 05/26/2022 0347   AST 42 (H) 05/26/2022 0347   ALT 33 05/26/2022 0347   ALKPHOS 83 05/26/2022 0347   BILITOT 0.8 05/26/2022 0347   GFRNONAA 59 (L) 05/27/2022 0334   GFRAA >60 09/29/2019 0427    GFR Estimated Creatinine Clearance: 69.3 mL/min (A) (by C-G formula based on SCr of 1.07 mg/dL (H)). No results for input(s): "LIPASE", "AMYLASE" in the last 72 hours. No results for input(s): "CKTOTAL", "CKMB", "CKMBINDEX", "TROPONINI" in the last 72 hours. Invalid input(s): "POCBNP" No results for input(s): "DDIMER" in the last 72 hours. No results for input(s): "HGBA1C" in the last 72 hours. No results for input(s): "CHOL", "HDL", "LDLCALC", "TRIG", "CHOLHDL", "LDLDIRECT" in the last 72 hours. No results for input(s): "TSH", "T4TOTAL", "T3FREE", "THYROIDAB" in the last 72 hours.  Invalid input(s): "FREET3" No results for input(s): "VITAMINB12", "FOLATE", "FERRITIN", "TIBC", "IRON", "RETICCTPCT" in the last 72 hours. Coags: No results for input(s): "INR" in the last 72 hours.  Invalid input(s): "PT" Microbiology: No results found for this or any previous visit (from the past 240 hour(s)).  FURTHER DISCHARGE INSTRUCTIONS:  Get Medicines reviewed and adjusted: Please take all your medications with you for your next visit with your Primary MD  Laboratory/radiological data: Please request your Primary MD to go over all hospital tests  and procedure/radiological results at the follow up, please ask your Primary MD to get all Hospital records sent to his/her office.  In some cases, they will be blood work, cultures and biopsy results pending at the time of your discharge. Please request that your primary care M.D. goes through all the records of your hospital data and follows up on these results.  Also Note the following: If you experience worsening of your admission symptoms, develop shortness of breath, life threatening emergency, suicidal or homicidal thoughts you must seek medical attention immediately by calling 911 or calling your MD immediately  if symptoms less severe.  You must read complete instructions/literature along with all the possible adverse reactions/side effects for all the Medicines you take and that have been prescribed to you. Take any new Medicines after you have completely understood and accpet all the possible adverse reactions/side effects.   Do not drive when taking Pain medications or sleeping medications (Benzodaizepines)  Do not take more than prescribed Pain, Sleep and Anxiety Medications. It is not advisable to combine anxiety,sleep and pain medications without talking with your primary care practitioner  Special Instructions: If you have smoked or chewed Tobacco  in the last 2 yrs please stop smoking, stop any regular Alcohol  and or any Recreational drug use.  Wear Seat belts while driving.  Please note: You were cared for by a hospitalist during your hospital stay. Once you are discharged, your primary care physician will handle any further medical issues. Please note that NO REFILLS for any discharge medications will be authorized once you are discharged, as it is imperative that you return to your primary care physician (or establish a relationship with a primary care physician if you do not have one) for your post hospital  discharge needs so that they can reassess your need for medications  and monitor your lab values.  Total Time spent coordinating discharge including counseling, education and face to face time equals greater than 30 minutes.  SignedJeoffrey Massed 05/29/2022 8:34 AM

## 2022-05-29 NOTE — TOC Transition Note (Signed)
Transition of Care [facility]) - CM/SW Discharge Note   Patient Details  Name: [name] MRN: [id number] Date of Birth: 17-Aug-1960  Transition of Care [facility]) CM/SW Contact:  Gordy Clement, RN Phone Number: 05/29/2022, 9:14 AM   Clinical Narrative:     Patient will DC to Home today with Husband. Rolling Dan Humphreys has been ordered by [employer] but when they attempted to deliver, she refused stating she had one. Patient will attend OPPT at  [facility] street location. AVS updated  no additional needs. Husband to transport : Arlys John  [phone]   No additional TOC needs     Final next level of care: OP Rehab Barriers to Discharge: No Barriers Identified   Patient Goals and CMS Choice CMS Medicare.gov Compare Post Acute Care list provided to:: Patient Choice offered to / list presented to : Patient  Discharge Placement                         Discharge Plan and Services Additional resources added to the After Visit Summary for     Discharge Planning Services: CM Consult Post Acute Care Choice: Durable Medical Equipment (OPPT)          DME Arranged: Dan Humphreys rolling   Date DME Agency Contacted: 05/27/22 Time DME Agency Contacted: 0300 Representative spoke with at DME Agency: [facility] HH Arranged: NA HH Agency: NA        Social Determinants of Health (SDOH) Interventions SDOH Screenings   Food Insecurity: Food Insecurity Present (09/12/2021)  Housing: Medium Risk (11/07/2021)  Transportation Needs: No Transportation Needs (09/12/2021)  Utilities: Not At Risk (11/11/2021)  Recent Concern: Utilities - At Risk (11/07/2021)  Depression (PHQ2-9): Low Risk  (03/24/2022)  Financial Resource Strain: Medium Risk (11/07/2021)  Tobacco Use: Medium Risk (05/24/2022)     Readmission Risk Interventions     No data to display

## 2022-05-29 NOTE — Plan of Care (Signed)

## 2022-05-30 ENCOUNTER — Telehealth: Payer: Self-pay | Admitting: *Deleted

## 2022-05-30 ENCOUNTER — Other Ambulatory Visit (HOSPITAL_COMMUNITY): Payer: Self-pay | Admitting: Physician Assistant

## 2022-05-30 ENCOUNTER — Encounter: Payer: Self-pay | Admitting: *Deleted

## 2022-05-30 ENCOUNTER — Other Ambulatory Visit: Payer: Self-pay | Admitting: Internal Medicine

## 2022-05-30 NOTE — Transitions of Care (Post Inpatient/ED Visit) (Signed)
   05/30/2022  Name: [name] MRN: [id number] DOB: [birth year]  Today's TOC FU Call Status: Today's TOC FU Call Status:: Unsuccessul Call (1st Attempt) Unsuccessful Call (1st Attempt) Date: 05/30/22  Attempted to reach the patient regarding the most recent Inpatient visit; left HIPAA compliant voice message requesting call back  Follow Up Plan: Additional outreach attempts will be made to reach the patient to complete the Transitions of Care (Post Inpatient visit) call.   Caryl Pina, RN, BSN, CCRN Alumnus RN CM Care Coordination/ Transition of Care- [facility] Care Management [phone]: direct office

## 2022-06-02 ENCOUNTER — Telehealth: Payer: Self-pay

## 2022-06-02 NOTE — Transitions of Care (Post Inpatient/ED Visit) (Signed)
   06/02/2022  Name: DALANA PFAHLER MRN: [id number] DOB: 09-22-1960  Today's TOC FU Call Status: Today's TOC FU Call Status:: Unsuccessful Call (2nd Attempt) Unsuccessful Call (2nd Attempt) Date: 06/02/22  Attempted to reach the patient regarding the most recent Inpatient/ED visit.  Follow Up Plan: Additional outreach attempts will be made to reach the patient to complete the Transitions of Care (Post Inpatient/ED visit) call.   Jodelle Gross, RN, BSN, CCM Care Management Coordinator [place] Phone: [phone]/Fax: [phone]

## 2022-06-03 ENCOUNTER — Telehealth: Payer: Self-pay

## 2022-06-03 NOTE — Transitions of Care (Post Inpatient/ED Visit) (Signed)
   06/03/2022  Name: [name] MRN: [id number] DOB: February 13, 1960  Today's TOC FU Call Status: Today's TOC FU Call Status:: Unsuccessful Call (3rd Attempt) Unsuccessful Call (3rd Attempt) Date: 06/03/22  Attempted to reach the patient regarding the most recent Inpatient/ED visit.  Follow Up Plan: No further outreach attempts will be made at this time. We have been unable to contact the patient.  Jodelle Gross, RN, BSN, CCM Care Management Coordinator [place] Phone: [phone]/Fax: [phone]

## 2022-06-11 DIAGNOSIS — Z419 Encounter for procedure for purposes other than remedying health state, unspecified: Secondary | ICD-10-CM | POA: Diagnosis not present

## 2022-06-20 ENCOUNTER — Other Ambulatory Visit: Payer: Self-pay

## 2022-06-20 MED ORDER — LOSARTAN POTASSIUM-HCTZ 100-25 MG PO TABS: 1.0000 | ORAL_TABLET | Freq: Every day | ORAL | 8 refills | Status: DC

## 2022-07-02 ENCOUNTER — Encounter: Payer: Self-pay | Admitting: Podiatry

## 2022-07-02 ENCOUNTER — Ambulatory Visit (INDEPENDENT_AMBULATORY_CARE_PROVIDER_SITE_OTHER): Payer: 59 | Admitting: Podiatry

## 2022-07-02 DIAGNOSIS — M79609 Pain in unspecified limb: Secondary | ICD-10-CM

## 2022-07-02 DIAGNOSIS — D689 Coagulation defect, unspecified: Secondary | ICD-10-CM

## 2022-07-02 DIAGNOSIS — B351 Tinea unguium: Secondary | ICD-10-CM

## 2022-07-03 NOTE — Progress Notes (Signed)
Subjective:   Patient ID: [name], female   DOB: 62 y.o.   MRN: [id number]   HPI Patient presents long-term diabetes obesity with significant nail disease 1-5 both feet that she cannot take care of   ROS      Objective:  Physical Exam  Neurovascular status intact thick incurvated nailbeds 1-5 both feet that become painful for the patient and patient has diabetes obesity cannot take care of this herself     Assessment:  Chronic mycotic nail infection with pain 1-5 both feet with patient having long term diabetes     Plan:  H&P reviewed and debrided nailbeds 1-5 today no angiogenic bleeding reappoint routine care may eventually require other nail removal but organ to hold off at this time and try to be more conservative due to long-term diabetes

## 2022-07-04 ENCOUNTER — Other Ambulatory Visit: Payer: Self-pay | Admitting: Internal Medicine

## 2022-07-08 ENCOUNTER — Other Ambulatory Visit: Payer: Self-pay

## 2022-07-12 ENCOUNTER — Other Ambulatory Visit (HOSPITAL_COMMUNITY): Payer: Self-pay | Admitting: Physician Assistant

## 2022-07-12 DIAGNOSIS — Z419 Encounter for procedure for purposes other than remedying health state, unspecified: Secondary | ICD-10-CM | POA: Diagnosis not present

## 2022-07-16 ENCOUNTER — Other Ambulatory Visit: Payer: Self-pay

## 2022-07-16 ENCOUNTER — Other Ambulatory Visit: Payer: Self-pay | Admitting: Internal Medicine

## 2022-07-28 ENCOUNTER — Other Ambulatory Visit: Payer: Self-pay | Admitting: Internal Medicine

## 2022-07-28 ENCOUNTER — Other Ambulatory Visit: Payer: Self-pay

## 2022-08-01 ENCOUNTER — Telehealth: Payer: Self-pay | Admitting: Internal Medicine

## 2022-08-01 NOTE — Telephone Encounter (Signed)
Pt called wanting medication prescribed for her bronchitis advise she have to make appt. Pt still wants a call back from nurse to speak with her.

## 2022-08-01 NOTE — Telephone Encounter (Signed)
Needs OV.  

## 2022-08-11 DIAGNOSIS — Z419 Encounter for procedure for purposes other than remedying health state, unspecified: Secondary | ICD-10-CM | POA: Diagnosis not present

## 2022-08-14 ENCOUNTER — Emergency Department (HOSPITAL_COMMUNITY): Payer: 59

## 2022-08-14 ENCOUNTER — Inpatient Hospital Stay (HOSPITAL_COMMUNITY)
Admission: EM | Admit: 2022-08-14 | Discharge: 2022-08-17 | DRG: 660 | Disposition: A | Discharge status: Home / Self Care | Payer: 59 | Attending: Internal Medicine | Admitting: Internal Medicine

## 2022-08-14 ENCOUNTER — Inpatient Hospital Stay (HOSPITAL_COMMUNITY): Payer: 59 | Admitting: Certified Registered"

## 2022-08-14 ENCOUNTER — Encounter (HOSPITAL_COMMUNITY)
Admission: EM | Disposition: A | Discharge status: Home / Self Care | Payer: Self-pay | Source: Home / Self Care | Attending: Internal Medicine

## 2022-08-14 ENCOUNTER — Encounter (HOSPITAL_COMMUNITY): Payer: Self-pay | Admitting: *Deleted

## 2022-08-14 ENCOUNTER — Other Ambulatory Visit: Payer: Self-pay

## 2022-08-14 ENCOUNTER — Inpatient Hospital Stay (HOSPITAL_COMMUNITY): Payer: 59

## 2022-08-14 DIAGNOSIS — Z807 Family history of other malignant neoplasms of lymphoid, hematopoietic and related tissues: Secondary | ICD-10-CM

## 2022-08-14 DIAGNOSIS — Z823 Family history of stroke: Secondary | ICD-10-CM

## 2022-08-14 DIAGNOSIS — A419 Sepsis, unspecified organism: Secondary | ICD-10-CM

## 2022-08-14 DIAGNOSIS — N179 Acute kidney failure, unspecified: Secondary | ICD-10-CM | POA: Diagnosis not present

## 2022-08-14 DIAGNOSIS — N200 Calculus of kidney: Principal | ICD-10-CM

## 2022-08-14 DIAGNOSIS — Z825 Family history of asthma and other chronic lower respiratory diseases: Secondary | ICD-10-CM

## 2022-08-14 DIAGNOSIS — I480 Paroxysmal atrial fibrillation: Secondary | ICD-10-CM | POA: Diagnosis present

## 2022-08-14 DIAGNOSIS — N132 Hydronephrosis with renal and ureteral calculous obstruction: Secondary | ICD-10-CM | POA: Diagnosis not present

## 2022-08-14 DIAGNOSIS — Z96652 Presence of left artificial knee joint: Secondary | ICD-10-CM | POA: Diagnosis not present

## 2022-08-14 DIAGNOSIS — Z7951 Long term (current) use of inhaled steroids: Secondary | ICD-10-CM | POA: Diagnosis not present

## 2022-08-14 DIAGNOSIS — K219 Gastro-esophageal reflux disease without esophagitis: Secondary | ICD-10-CM | POA: Diagnosis present

## 2022-08-14 DIAGNOSIS — E785 Hyperlipidemia, unspecified: Secondary | ICD-10-CM | POA: Diagnosis present

## 2022-08-14 DIAGNOSIS — N139 Obstructive and reflux uropathy, unspecified: Secondary | ICD-10-CM

## 2022-08-14 DIAGNOSIS — F10239 Alcohol dependence with withdrawal, unspecified: Secondary | ICD-10-CM | POA: Diagnosis not present

## 2022-08-14 DIAGNOSIS — Z91040 Latex allergy status: Secondary | ICD-10-CM

## 2022-08-14 DIAGNOSIS — N201 Calculus of ureter: Principal | ICD-10-CM | POA: Diagnosis present

## 2022-08-14 DIAGNOSIS — R2681 Unsteadiness on feet: Secondary | ICD-10-CM | POA: Diagnosis not present

## 2022-08-14 DIAGNOSIS — Z87891 Personal history of nicotine dependence: Secondary | ICD-10-CM

## 2022-08-14 DIAGNOSIS — Z8701 Personal history of pneumonia (recurrent): Secondary | ICD-10-CM

## 2022-08-14 DIAGNOSIS — J4540 Moderate persistent asthma, uncomplicated: Secondary | ICD-10-CM | POA: Diagnosis present

## 2022-08-14 DIAGNOSIS — R0989 Other specified symptoms and signs involving the circulatory and respiratory systems: Secondary | ICD-10-CM | POA: Diagnosis not present

## 2022-08-14 DIAGNOSIS — N136 Pyonephrosis: Secondary | ICD-10-CM | POA: Diagnosis not present

## 2022-08-14 DIAGNOSIS — Z90710 Acquired absence of both cervix and uterus: Secondary | ICD-10-CM

## 2022-08-14 DIAGNOSIS — F1020 Alcohol dependence, uncomplicated: Secondary | ICD-10-CM | POA: Diagnosis present

## 2022-08-14 DIAGNOSIS — R7401 Elevation of levels of liver transaminase levels: Secondary | ICD-10-CM | POA: Diagnosis not present

## 2022-08-14 DIAGNOSIS — D1803 Hemangioma of intra-abdominal structures: Secondary | ICD-10-CM | POA: Diagnosis not present

## 2022-08-14 DIAGNOSIS — Z811 Family history of alcohol abuse and dependence: Secondary | ICD-10-CM

## 2022-08-14 DIAGNOSIS — Z888 Allergy status to other drugs, medicaments and biological substances status: Secondary | ICD-10-CM | POA: Diagnosis not present

## 2022-08-14 DIAGNOSIS — Z6841 Body Mass Index (BMI) 40.0 and over, adult: Secondary | ICD-10-CM | POA: Diagnosis not present

## 2022-08-14 DIAGNOSIS — M6281 Muscle weakness (generalized): Secondary | ICD-10-CM | POA: Diagnosis not present

## 2022-08-14 DIAGNOSIS — B9620 Unspecified Escherichia coli [E. coli] as the cause of diseases classified elsewhere: Secondary | ICD-10-CM | POA: Diagnosis not present

## 2022-08-14 DIAGNOSIS — Z87448 Personal history of other diseases of urinary system: Secondary | ICD-10-CM

## 2022-08-14 DIAGNOSIS — K5730 Diverticulosis of large intestine without perforation or abscess without bleeding: Secondary | ICD-10-CM | POA: Diagnosis not present

## 2022-08-14 DIAGNOSIS — Z8719 Personal history of other diseases of the digestive system: Secondary | ICD-10-CM

## 2022-08-14 DIAGNOSIS — R7303 Prediabetes: Secondary | ICD-10-CM | POA: Diagnosis not present

## 2022-08-14 DIAGNOSIS — R000 Tachycardia, unspecified: Secondary | ICD-10-CM | POA: Diagnosis not present

## 2022-08-14 DIAGNOSIS — Z7901 Long term (current) use of anticoagulants: Secondary | ICD-10-CM | POA: Diagnosis not present

## 2022-08-14 DIAGNOSIS — I10 Essential (primary) hypertension: Secondary | ICD-10-CM | POA: Diagnosis not present

## 2022-08-14 DIAGNOSIS — N130 Hydronephrosis with ureteropelvic junction obstruction: Secondary | ICD-10-CM | POA: Diagnosis not present

## 2022-08-14 DIAGNOSIS — Z86010 Personal history of colonic polyps: Secondary | ICD-10-CM

## 2022-08-14 DIAGNOSIS — I1310 Hypertensive heart and chronic kidney disease without heart failure, with stage 1 through stage 4 chronic kidney disease, or unspecified chronic kidney disease: Secondary | ICD-10-CM | POA: Diagnosis not present

## 2022-08-14 DIAGNOSIS — Z86711 Personal history of pulmonary embolism: Secondary | ICD-10-CM | POA: Diagnosis present

## 2022-08-14 DIAGNOSIS — I739 Peripheral vascular disease, unspecified: Secondary | ICD-10-CM | POA: Diagnosis not present

## 2022-08-14 DIAGNOSIS — J4541 Moderate persistent asthma with (acute) exacerbation: Secondary | ICD-10-CM | POA: Diagnosis not present

## 2022-08-14 DIAGNOSIS — R079 Chest pain, unspecified: Secondary | ICD-10-CM | POA: Diagnosis not present

## 2022-08-14 DIAGNOSIS — Z885 Allergy status to narcotic agent status: Secondary | ICD-10-CM

## 2022-08-14 DIAGNOSIS — N1831 Chronic kidney disease, stage 3a: Secondary | ICD-10-CM | POA: Diagnosis present

## 2022-08-14 DIAGNOSIS — Z466 Encounter for fitting and adjustment of urinary device: Secondary | ICD-10-CM | POA: Diagnosis not present

## 2022-08-14 DIAGNOSIS — E860 Dehydration: Secondary | ICD-10-CM | POA: Diagnosis present

## 2022-08-14 DIAGNOSIS — R1032 Left lower quadrant pain: Secondary | ICD-10-CM | POA: Diagnosis not present

## 2022-08-14 DIAGNOSIS — Z887 Allergy status to serum and vaccine status: Secondary | ICD-10-CM

## 2022-08-14 DIAGNOSIS — E876 Hypokalemia: Secondary | ICD-10-CM | POA: Diagnosis present

## 2022-08-14 DIAGNOSIS — Z91013 Allergy to seafood: Secondary | ICD-10-CM

## 2022-08-14 DIAGNOSIS — R059 Cough, unspecified: Secondary | ICD-10-CM | POA: Diagnosis not present

## 2022-08-14 DIAGNOSIS — Z6372 Alcoholism and drug addiction in family: Secondary | ICD-10-CM

## 2022-08-14 DIAGNOSIS — R0602 Shortness of breath: Secondary | ICD-10-CM | POA: Diagnosis not present

## 2022-08-14 HISTORY — PX: CYSTOSCOPY WITH RETROGRADE PYELOGRAM, URETEROSCOPY AND STENT PLACEMENT: SHX5789

## 2022-08-14 HISTORY — PX: CYSTOSCOPY WITH URETEROSCOPY AND STENT PLACEMENT: SHX6377

## 2022-08-14 LAB — COMPREHENSIVE METABOLIC PANEL
ALT: 45 U/L — ABNORMAL HIGH (ref 0–44)
AST: 55 U/L — ABNORMAL HIGH (ref 15–41)
Albumin: 3.3 g/dL — ABNORMAL LOW (ref 3.5–5.0)
Alkaline Phosphatase: 92 U/L (ref 38–126)
Anion gap: 15 (ref 5–15)
BUN: 16 mg/dL (ref 8–23)
CO2: 24 mmol/L (ref 22–32)
Calcium: 9.2 mg/dL (ref 8.9–10.3)
Chloride: 98 mmol/L (ref 98–111)
Creatinine, Ser: 1.35 mg/dL — ABNORMAL HIGH (ref 0.44–1.00)
GFR, Estimated: 45 mL/min — ABNORMAL LOW (ref >60)
Glucose, Bld: 142 mg/dL — ABNORMAL HIGH (ref 70–99)
Potassium: 3.4 mmol/L — ABNORMAL LOW (ref 3.5–5.1)
Sodium: 137 mmol/L (ref 135–145)
Total Bilirubin: 1.3 mg/dL — ABNORMAL HIGH (ref 0.3–1.2)
Total Protein: 7.3 g/dL (ref 6.5–8.1)

## 2022-08-14 LAB — URINALYSIS, W/ REFLEX TO CULTURE (INFECTION SUSPECTED)
Bilirubin Urine: NEGATIVE
Glucose, UA: NEGATIVE mg/dL
Ketones, ur: NEGATIVE mg/dL
Nitrite: NEGATIVE
Protein, ur: 100 mg/dL — AB
RBC / HPF: >50 RBC/hpf (ref 0–5)
Specific Gravity, Urine: 1.023 (ref 1.005–1.030)
WBC, UA: >50 WBC/hpf (ref 0–5)
pH: 5 (ref 5.0–8.0)

## 2022-08-14 LAB — CBC WITH DIFFERENTIAL/PLATELET
Abs Immature Granulocytes: 0.11 10*3/uL — ABNORMAL HIGH (ref 0.00–0.07)
Basophils Absolute: 0.1 10*3/uL (ref 0.0–0.1)
Basophils Relative: 0 %
Eosinophils Absolute: 0.1 10*3/uL (ref 0.0–0.5)
Eosinophils Relative: 0 %
HCT: 44.3 % (ref 36.0–46.0)
Hemoglobin: 14.1 g/dL (ref 12.0–15.0)
Immature Granulocytes: 1 %
Lymphocytes Relative: 3 %
Lymphs Abs: 0.5 10*3/uL — ABNORMAL LOW (ref 0.7–4.0)
MCH: 30.5 pg (ref 26.0–34.0)
MCHC: 31.8 g/dL (ref 30.0–36.0)
MCV: 95.7 fL (ref 80.0–100.0)
Monocytes Absolute: 0.7 10*3/uL (ref 0.1–1.0)
Monocytes Relative: 4 %
Neutro Abs: 16.7 10*3/uL — ABNORMAL HIGH (ref 1.7–7.7)
Neutrophils Relative %: 92 %
Platelets: 213 10*3/uL (ref 150–400)
RBC: 4.63 MIL/uL (ref 3.87–5.11)
RDW: 13.6 % (ref 11.5–15.5)
WBC: 18.2 10*3/uL — ABNORMAL HIGH (ref 4.0–10.5)
nRBC: 0 % (ref 0.0–0.2)

## 2022-08-14 LAB — BLOOD CULTURE ID PANEL (REFLEXED) - BCID2

## 2022-08-14 LAB — PROTIME-INR
INR: 1.1 (ref 0.8–1.2)
Prothrombin Time: 14.6 seconds (ref 11.4–15.2)

## 2022-08-14 LAB — SURGICAL PCR SCREEN
MRSA, PCR: NEGATIVE
Staphylococcus aureus: POSITIVE — AB

## 2022-08-14 LAB — LACTIC ACID, PLASMA
Lactic Acid, Venous: 1.7 mmol/L (ref 0.5–1.9)
Lactic Acid, Venous: 1.4 mmol/L (ref 0.5–1.9)

## 2022-08-14 LAB — ANAEROBIC CULTURE W GRAM STAIN

## 2022-08-14 LAB — CULTURE, BLOOD (ROUTINE X 2)
Special Requests: ADEQUATE
Special Requests: ADEQUATE

## 2022-08-14 SURGERY — CYSTOURETEROSCOPY, WITH RETROGRADE PYELOGRAM AND STENT INSERTION
Anesthesia: General | Site: Ureter | Laterality: Left

## 2022-08-14 MED ORDER — SUCCINYLCHOLINE CHLORIDE 200 MG/10ML IV SOSY
PREFILLED_SYRINGE | INTRAVENOUS | Status: AC
  Filled 2022-08-14: qty 10

## 2022-08-14 MED ORDER — LORAZEPAM 0.5 MG PO TABS
0.5000 mg | ORAL_TABLET | Freq: Two times a day (BID) | ORAL | Status: DC | PRN
  Administered 2022-08-15 – 2022-08-17 (×3): 0.5 mg via ORAL
  Filled 2022-08-14 (×3): qty 1

## 2022-08-14 MED ORDER — SUCCINYLCHOLINE CHLORIDE 200 MG/10ML IV SOSY
PREFILLED_SYRINGE | INTRAVENOUS | Status: DC | PRN
  Administered 2022-08-14: 140 mg via INTRAVENOUS

## 2022-08-14 MED ORDER — FENTANYL CITRATE (PF) 250 MCG/5ML IJ SOLN
INTRAMUSCULAR | Status: DC | PRN
  Administered 2022-08-14: 25 ug via INTRAVENOUS

## 2022-08-14 MED ORDER — SODIUM CHLORIDE 0.9 % IV SOLN: INTRAVENOUS | Status: DC

## 2022-08-14 MED ORDER — MIDAZOLAM HCL 2 MG/2ML IJ SOLN
INTRAMUSCULAR | Status: DC | PRN
  Administered 2022-08-14: 2 mg via INTRAVENOUS

## 2022-08-14 MED ORDER — HYDRALAZINE HCL 20 MG/ML IJ SOLN: 5.0000 mg | INTRAMUSCULAR | Status: DC | PRN

## 2022-08-14 MED ORDER — POLYETHYLENE GLYCOL 3350 17 G PO PACK: 17.0000 g | PACK | Freq: Every day | ORAL | Status: DC | PRN

## 2022-08-14 MED ORDER — LORAZEPAM 2 MG/ML IJ SOLN
1.0000 mg | INTRAMUSCULAR | Status: DC | PRN
  Administered 2022-08-14: 1 mg via INTRAVENOUS

## 2022-08-14 MED ORDER — SODIUM CHLORIDE 0.9% FLUSH
3.0000 mL | Freq: Two times a day (BID) | INTRAVENOUS | Status: DC
  Administered 2022-08-14 – 2022-08-16 (×4): 3 mL via INTRAVENOUS

## 2022-08-14 MED ORDER — FAMOTIDINE 20 MG PO TABS
20.0000 mg | ORAL_TABLET | Freq: Two times a day (BID) | ORAL | Status: DC
  Administered 2022-08-14 – 2022-08-17 (×6): 20 mg via ORAL
  Filled 2022-08-14 (×7): qty 1

## 2022-08-14 MED ORDER — IOHEXOL 300 MG/ML  SOLN
INTRAMUSCULAR | Status: DC | PRN
  Administered 2022-08-14: 6 mL

## 2022-08-14 MED ORDER — METOPROLOL SUCCINATE ER 25 MG PO TB24
25.0000 mg | ORAL_TABLET | Freq: Every day | ORAL | Status: DC
  Administered 2022-08-14 – 2022-08-17 (×4): 25 mg via ORAL
  Filled 2022-08-14 (×4): qty 1

## 2022-08-14 MED ORDER — LORAZEPAM 1 MG PO TABS
1.0000 mg | ORAL_TABLET | ORAL | Status: DC | PRN
  Filled 2022-08-14: qty 1

## 2022-08-14 MED ORDER — DEXAMETHASONE SODIUM PHOSPHATE 10 MG/ML IJ SOLN
INTRAMUSCULAR | Status: DC | PRN
  Administered 2022-08-14: 4 mg via INTRAVENOUS

## 2022-08-14 MED ORDER — ADULT MULTIVITAMIN W/MINERALS CH
1.0000 | ORAL_TABLET | Freq: Every day | ORAL | Status: DC
  Administered 2022-08-15 – 2022-08-17 (×3): 1 via ORAL
  Filled 2022-08-14 (×3): qty 1

## 2022-08-14 MED ORDER — LIDOCAINE 2% (20 MG/ML) 5 ML SYRINGE
INTRAMUSCULAR | Status: AC
  Filled 2022-08-14: qty 5

## 2022-08-14 MED ORDER — CHLORHEXIDINE GLUCONATE CLOTH 2 % EX PADS
6.0000 | MEDICATED_PAD | Freq: Every day | CUTANEOUS | Status: DC
  Administered 2022-08-15: 6 via TOPICAL

## 2022-08-14 MED ORDER — ACETAMINOPHEN 650 MG RE SUPP: 650.0000 mg | Freq: Four times a day (QID) | RECTAL | Status: DC | PRN

## 2022-08-14 MED ORDER — SODIUM CHLORIDE 0.9 % IV SOLN: 2.0000 g | INTRAVENOUS | Status: DC

## 2022-08-14 MED ORDER — LIDOCAINE 2% (20 MG/ML) 5 ML SYRINGE
INTRAMUSCULAR | Status: DC | PRN
  Administered 2022-08-14: 60 mg via INTRAVENOUS

## 2022-08-14 MED ORDER — SODIUM CHLORIDE 0.9 % IV SOLN
1.0000 g | Freq: Once | INTRAVENOUS | Status: AC
  Administered 2022-08-14: 1 g via INTRAVENOUS
  Filled 2022-08-14: qty 10

## 2022-08-14 MED ORDER — ONDANSETRON HCL 4 MG/2ML IJ SOLN
4.0000 mg | Freq: Once | INTRAMUSCULAR | Status: AC
  Administered 2022-08-14: 4 mg via INTRAVENOUS
  Filled 2022-08-14: qty 2

## 2022-08-14 MED ORDER — IOHEXOL 350 MG/ML SOLN
60.0000 mL | Freq: Once | INTRAVENOUS | Status: AC | PRN
  Administered 2022-08-14: 60 mL via INTRAVENOUS

## 2022-08-14 MED ORDER — CHLORHEXIDINE GLUCONATE 0.12 % MT SOLN
OROMUCOSAL | Status: AC
  Filled 2022-08-14: qty 15

## 2022-08-14 MED ORDER — DEXAMETHASONE SODIUM PHOSPHATE 10 MG/ML IJ SOLN
INTRAMUSCULAR | Status: AC
  Filled 2022-08-14: qty 1

## 2022-08-14 MED ORDER — SODIUM CHLORIDE 0.9 % IV SOLN: 1.0000 g | INTRAVENOUS | Status: DC

## 2022-08-14 MED ORDER — ACETAMINOPHEN 500 MG PO TABS
1000.0000 mg | ORAL_TABLET | Freq: Once | ORAL | Status: AC
  Administered 2022-08-14: 1000 mg via ORAL
  Filled 2022-08-14: qty 2

## 2022-08-14 MED ORDER — FENTANYL CITRATE PF 50 MCG/ML IJ SOSY
50.0000 ug | PREFILLED_SYRINGE | Freq: Once | INTRAMUSCULAR | Status: AC
  Administered 2022-08-14: 50 ug via INTRAVENOUS
  Filled 2022-08-14: qty 1

## 2022-08-14 MED ORDER — LORAZEPAM 2 MG/ML IJ SOLN: 0.0000 mg | Freq: Two times a day (BID) | INTRAMUSCULAR | Status: DC

## 2022-08-14 MED ORDER — ALBUTEROL SULFATE (2.5 MG/3ML) 0.083% IN NEBU: 2.5000 mg | INHALATION_SOLUTION | Freq: Four times a day (QID) | RESPIRATORY_TRACT | Status: DC | PRN

## 2022-08-14 MED ORDER — THIAMINE MONONITRATE 100 MG PO TABS
100.0000 mg | ORAL_TABLET | Freq: Every day | ORAL | Status: DC
  Administered 2022-08-15 – 2022-08-17 (×3): 100 mg via ORAL
  Filled 2022-08-14 (×3): qty 1

## 2022-08-14 MED ORDER — MIDAZOLAM HCL 2 MG/2ML IJ SOLN
INTRAMUSCULAR | Status: AC
  Filled 2022-08-14: qty 2

## 2022-08-14 MED ORDER — MOMETASONE FURO-FORMOTEROL FUM 100-5 MCG/ACT IN AERO
2.0000 | INHALATION_SPRAY | Freq: Two times a day (BID) | RESPIRATORY_TRACT | Status: DC
  Administered 2022-08-14 – 2022-08-17 (×6): 2 via RESPIRATORY_TRACT
  Filled 2022-08-14: qty 8.8

## 2022-08-14 MED ORDER — ATORVASTATIN CALCIUM 10 MG PO TABS
20.0000 mg | ORAL_TABLET | Freq: Every day | ORAL | Status: DC
  Administered 2022-08-15 – 2022-08-17 (×3): 20 mg via ORAL
  Filled 2022-08-14 (×3): qty 2

## 2022-08-14 MED ORDER — ROCURONIUM BROMIDE 10 MG/ML (PF) SYRINGE
PREFILLED_SYRINGE | INTRAVENOUS | Status: AC
  Filled 2022-08-14: qty 10

## 2022-08-14 MED ORDER — PANTOPRAZOLE SODIUM 40 MG PO TBEC: 40.0000 mg | DELAYED_RELEASE_TABLET | Freq: Every day | ORAL | Status: DC

## 2022-08-14 MED ORDER — ONDANSETRON HCL 4 MG/2ML IJ SOLN
INTRAMUSCULAR | Status: AC
  Filled 2022-08-14: qty 2

## 2022-08-14 MED ORDER — ONDANSETRON HCL 4 MG/2ML IJ SOLN
4.0000 mg | Freq: Four times a day (QID) | INTRAMUSCULAR | Status: DC | PRN
  Administered 2022-08-14 – 2022-08-16 (×2): 4 mg via INTRAVENOUS
  Filled 2022-08-14 (×2): qty 2

## 2022-08-14 MED ORDER — FOLIC ACID 1 MG PO TABS
1.0000 mg | ORAL_TABLET | Freq: Every day | ORAL | Status: DC
  Administered 2022-08-15 – 2022-08-17 (×3): 1 mg via ORAL
  Filled 2022-08-14 (×3): qty 1

## 2022-08-14 MED ORDER — THIAMINE HCL 100 MG/ML IJ SOLN: 100.0000 mg | Freq: Every day | INTRAMUSCULAR | Status: DC

## 2022-08-14 MED ORDER — BISACODYL 5 MG PO TBEC: 5.0000 mg | DELAYED_RELEASE_TABLET | Freq: Every day | ORAL | Status: DC | PRN

## 2022-08-14 MED ORDER — SODIUM CHLORIDE 0.9 % IV BOLUS
1000.0000 mL | Freq: Once | INTRAVENOUS | Status: AC
  Administered 2022-08-14: 1000 mL via INTRAVENOUS

## 2022-08-14 MED ORDER — ACETAMINOPHEN 325 MG PO TABS
650.0000 mg | ORAL_TABLET | Freq: Four times a day (QID) | ORAL | Status: DC | PRN
  Administered 2022-08-14 – 2022-08-15 (×2): 650 mg via ORAL
  Filled 2022-08-14 (×2): qty 2

## 2022-08-14 MED ORDER — MUPIROCIN 2 % EX OINT
1.0000 | TOPICAL_OINTMENT | Freq: Two times a day (BID) | CUTANEOUS | Status: DC
  Administered 2022-08-14 – 2022-08-17 (×6): 1 via NASAL
  Filled 2022-08-14 (×3): qty 22

## 2022-08-14 MED ORDER — OXYCODONE HCL 5 MG PO TABS
5.0000 mg | ORAL_TABLET | ORAL | Status: DC | PRN
  Administered 2022-08-14 – 2022-08-15 (×2): 5 mg via ORAL
  Filled 2022-08-14 (×2): qty 1

## 2022-08-14 MED ORDER — ONDANSETRON HCL 4 MG PO TABS
4.0000 mg | ORAL_TABLET | Freq: Four times a day (QID) | ORAL | Status: DC | PRN
  Administered 2022-08-17: 4 mg via ORAL
  Filled 2022-08-14: qty 1

## 2022-08-14 MED ORDER — PROPOFOL 10 MG/ML IV BOLUS
INTRAVENOUS | Status: AC
  Filled 2022-08-14: qty 20

## 2022-08-14 MED ORDER — HYDROMORPHONE HCL 1 MG/ML IJ SOLN
0.5000 mg | INTRAMUSCULAR | Status: AC | PRN
  Administered 2022-08-14 – 2022-08-15 (×3): 1 mg via INTRAVENOUS
  Filled 2022-08-14 (×3): qty 1

## 2022-08-14 MED ORDER — APIXABAN 5 MG PO TABS
5.0000 mg | ORAL_TABLET | Freq: Two times a day (BID) | ORAL | Status: DC
  Administered 2022-08-14 – 2022-08-17 (×6): 5 mg via ORAL
  Filled 2022-08-14 (×6): qty 1

## 2022-08-14 MED ORDER — DOCUSATE SODIUM 100 MG PO CAPS
100.0000 mg | ORAL_CAPSULE | Freq: Two times a day (BID) | ORAL | Status: DC
  Administered 2022-08-14 – 2022-08-17 (×6): 100 mg via ORAL
  Filled 2022-08-14 (×6): qty 1

## 2022-08-14 MED ORDER — ORAL CARE MOUTH RINSE: 15.0000 mL | OROMUCOSAL | Status: DC | PRN

## 2022-08-14 MED ORDER — LACTATED RINGERS IV SOLN: INTRAVENOUS | Status: DC | PRN

## 2022-08-14 MED ORDER — FENTANYL CITRATE (PF) 100 MCG/2ML IJ SOLN: 25.0000 ug | INTRAMUSCULAR | Status: DC | PRN

## 2022-08-14 MED ORDER — LORAZEPAM 2 MG/ML IJ SOLN
0.0000 mg | Freq: Four times a day (QID) | INTRAMUSCULAR | Status: DC
  Administered 2022-08-15: 1 mg via INTRAVENOUS
  Filled 2022-08-14 (×2): qty 1

## 2022-08-14 MED ORDER — SUGAMMADEX SODIUM 200 MG/2ML IV SOLN
INTRAVENOUS | Status: DC | PRN
  Administered 2022-08-14: 400 mg via INTRAVENOUS

## 2022-08-14 MED ORDER — PROPOFOL 10 MG/ML IV BOLUS
INTRAVENOUS | Status: DC | PRN
  Administered 2022-08-14: 70 mg via INTRAVENOUS
  Administered 2022-08-14: 200 mg via INTRAVENOUS
  Administered 2022-08-14: 130 mg via INTRAVENOUS

## 2022-08-14 MED ORDER — ONDANSETRON HCL 4 MG/2ML IJ SOLN
INTRAMUSCULAR | Status: DC | PRN
  Administered 2022-08-14: 4 mg via INTRAVENOUS

## 2022-08-14 MED ORDER — FENTANYL CITRATE (PF) 250 MCG/5ML IJ SOLN
INTRAMUSCULAR | Status: AC
  Filled 2022-08-14: qty 5

## 2022-08-14 MED ORDER — ROCURONIUM BROMIDE 10 MG/ML (PF) SYRINGE
PREFILLED_SYRINGE | INTRAVENOUS | Status: DC | PRN
  Administered 2022-08-14: 30 mg via INTRAVENOUS

## 2022-08-14 SURGICAL SUPPLY — 26 items
BAG DRN RND TRDRP ANRFLXCHMBR (UROLOGICAL SUPPLIES) ×2
BAG URINE DRAIN 2000ML AR STRL (UROLOGICAL SUPPLIES) ×3 IMPLANT
BAG URO CATCHER STRL LF (MISCELLANEOUS) ×3 IMPLANT
CATH FOLEY 2WAY 5CC 16FR (CATHETERS)
CATH URET 5FR 28IN OPEN ENDED (CATHETERS) IMPLANT
CATH URETL OPEN END 6FR 70 (CATHETERS) IMPLANT
CATH URTH STD 16FR FL 2W DRN (CATHETERS) ×3 IMPLANT
GLOVE BIOGEL M 7.0 STRL (GLOVE) ×3 IMPLANT
GLOVE BIOGEL PI IND STRL 6.5 (GLOVE) IMPLANT
GOWN STRL REUS W/TWL LRG LVL3 (GOWN DISPOSABLE) ×3 IMPLANT
GUIDEWIRE ANG ZIPWIRE 038X150 (WIRE) IMPLANT
GUIDEWIRE STR DUAL SENSOR (WIRE) ×6 IMPLANT
IV CATH 14GX2 1/4 (CATHETERS) IMPLANT
IV NS 1000ML (IV SOLUTION) ×2
IV NS 1000ML BAXH (IV SOLUTION) ×3 IMPLANT
KIT TURNOVER KIT A (KITS) IMPLANT
MANIFOLD NEPTUNE II (INSTRUMENTS) ×3 IMPLANT
MAT PREVALON FULL STRYKER (MISCELLANEOUS) IMPLANT
PACK CYSTO (CUSTOM PROCEDURE TRAY) ×3 IMPLANT
STENT CONTOUR 6FRX24X.038 (STENTS) IMPLANT
STENT CONTOUR 6FRX26X.038 (STENTS) ×3 IMPLANT
STENT URET 6FRX24 CONTOUR (STENTS) IMPLANT
TRAY CATH INTERMITTENT SS 16FR (CATHETERS) IMPLANT
TRAY FOL W/BAG SLVR 16FR STRL (SET/KITS/TRAYS/PACK) IMPLANT
TRAY FOLEY W/BAG SLVR 16FR LF (SET/KITS/TRAYS/PACK) ×2
TUBING CONNECTING 10 (TUBING) ×3 IMPLANT

## 2022-08-14 NOTE — Op Note (Signed)
Operative Note  Preoperative diagnosis:  1.  Left ureteral stone with obstruction and infection  Postoperative diagnosis: 1.  Same  Procedure(s): 1.  Cystoscopy 2. Left retrograde pyelogram with interpretation 3. Left ureteral stent placement 4. Fluoroscopy <1 hour with intraoperative interpretation  Surgeon: Jettie Pagan, MD  Assistants:  None  Anesthesia:  General  Complications:  None  EBL:  Minimal  Specimens: 1.  ID Type Source Tests Collected by Time Destination  A : Left Renal Pelvis Urine Culture Tissue PATH GU biopsy AEROBIC/ANAEROBIC CULTURE W GRAM STAIN (SURGICAL/DEEP WOUND) [name], MD 08/14/2022 1600    Drains/Catheters: 1.  Left 6Fr x 24cm ureteral stent 2. 16 Fr foley catheter   Intraoperative findings:   Cystoscopy demonstrated no suspicious lesions. Cystoscopy demonstrated a 2x2cm area of erythema, edema on the posterior aspect of the bladder. No evidence of patent fistula, however likely location of chronic colovesicular/diverticular abcess location that previously underwent drainage in 2019. No evidence of fecal material in bladder. No suspicion of bladder cancer. Left retrograde pyelogram demonstrated moderate left hydronephrosis. Successful left ureteral stent placement with curl in the renal pelvis and bladder respectively.  Indication:  [name] is a 62 y.o. female with an obstructing left ureteral stone with sign of infection. After reviewing the management options for treatment, she elected to proceed with the above surgical procedure(s). We have discussed the potential benefits and risks of the procedure, side effects of the proposed treatment, the likelihood of the patient achieving the goals of the procedure, and any potential problems that might occur during the procedure or recuperation. Informed consent has been obtained.  Description of procedure: The patient was taken to the operating room and general anesthesia was induced.  The  patient was placed in the dorsal lithotomy position, prepped and draped in the usual sterile fashion, and preoperative antibiotics were administered. A preoperative time-out was performed.   Cystourethroscopy was performed.  The patient's urethra was examined and was normal. There was no evidence for any bladder tumors, stones. Cystoscopy demonstrated a 2x2cm area of erythema, edema on the posterior aspect of the bladder. No evidence of patent fistula, however likely location of chronic colovesicular/diverticular abcess location that previously underwent drainage in 2019. No evidence of fecal material in bladder. No suspicion of bladder cancer.  Attention then turned to the left ureteral orifice. A 0.038 zip wire was passed through the left orifice and over the wire a 5 Fr open ended catheter was inserted and passed up to the level of the renal pelvis. Aspirate was obtained and sent off as left renal pelvis urine for culture. Omnipaque contrast was injected through the ureteral catheter and a retrograde pyelogram was performed with findings as dictated above. The wire was then replaced and the open ended catheter was removed.   A 6Fr x 24cm ureteral stent was advance over the wire. The stent was positioned appropriately under fluoroscopic and cystoscopic guidance.  The wire was then removed with an adequate stent curl noted in the renal pelvis as well as in the bladder.  The bladder was then emptied and the procedure ended.  The patient appeared to tolerate the procedure well and without complications.  The patient was able to be awakened and transferred to the recovery unit in satisfactory condition.   Plan:  Will arrange for elective treatment of stone as outpatient. Continue abx. Will require culture specific abx for 14 days.  Matt R. Kennia Vanvorst MD [facility]  Pager: [phone]

## 2022-08-14 NOTE — ED Notes (Signed)
Patient sats 90-91% RA upon arrival to ER. Patient placed on 2L of oxygen after vitals taken. O2 rechecked at 94% 2L

## 2022-08-14 NOTE — Anesthesia Procedure Notes (Signed)
Procedure Name: LMA Insertion Date/Time: 08/14/2022 3:41 PM  Performed by: Drema Pry, CRNAPre-anesthesia Checklist: Patient identified, Emergency Drugs available, Suction available and Patient being monitored Patient Re-evaluated:Patient Re-evaluated prior to induction Oxygen Delivery Method: Circle System Utilized Preoxygenation: Pre-oxygenation with 100% oxygen Induction Type: IV induction, Rapid sequence and Cricoid Pressure applied Ventilation: Mask ventilation without difficulty Laryngoscope Size: Glidescope and 3 Grade View: Grade I Tube type: Oral Tube size: 7.0 mm Number of attempts: 1 Placement Confirmation: positive ETCO2 Secured at: 21 cm Tube secured with: Tape Dental Injury: Teeth and Oropharynx as per pre-operative assessment  Comments: Elective glidescope d/t body habitus and RSI

## 2022-08-14 NOTE — Progress Notes (Signed)
   08/14/22 0928  Assess: MEWS Score  Temp 99 F (37.2 C)  BP 128/70  MAP (mmHg) 84  Pulse Rate (!) 103  Resp (!) 22  Level of Consciousness Alert  SpO2 95 %  O2 Device Room Air  Patient Activity (if Appropriate) In bed  Assess: MEWS Score  MEWS Temp 0  MEWS Systolic 0  MEWS Pulse 1  MEWS RR 1  MEWS LOC 0  MEWS Score 2  MEWS Score Color Yellow  Assess: if the MEWS score is Yellow or Red  Were vital signs taken at a resting state? Yes  Focused Assessment No change from prior assessment  Does the patient meet 2 or more of the SIRS criteria? Yes  Does the patient have a confirmed or suspected source of infection? Yes  MEWS guidelines implemented  Yes, yellow  Treat  MEWS Interventions Considered administering scheduled or prn medications/treatments as ordered  Take Vital Signs  Increase Vital Sign Frequency  Yellow: Q2hr x1, continue Q4hrs until patient remains green for 12hrs  Escalate  MEWS: Escalate Yellow: Discuss with charge nurse and consider notifying provider and/or RRT  Notify: Charge Nurse/RN  Name of Charge Nurse/RN Notified myself, Kirtland Bouchard, RN  Provider Notification  Provider Name/Title Dr. Ophelia Charter  Date Provider Notified 08/14/22  Time Provider Notified [phone]  Method of Notification Page  Notification Reason Other (Comment) (yellow mews/sepsis score)  Provider response Other (Comment);No new orders (asked when OR would be)  Date of Provider Response 08/14/22  Time of Provider Response 0930  Notify: Rapid Response  Name of Rapid Response RN Notified Hannah, RN  Date Rapid Response Notified 08/14/22  Time Rapid Response Notified 1129  Assess: SIRS CRITERIA  SIRS Temperature  0  SIRS Pulse 1  SIRS Respirations  1  SIRS WBC 1  SIRS Score Sum  3   Pt for possible OR today. Temp now 99.0. Pulse down to 95 at present.

## 2022-08-14 NOTE — Anesthesia Postprocedure Evaluation (Signed)
Anesthesia Post Note  Patient: [name]  Procedure(s) Performed: CYSTOSCOPY WITH RETROGRADE PYELOGRAM (Left) URETEROSCOPY AND STENT PLACEMENT (Left: Ureter)     Patient location during evaluation: Other Anesthesia Type: General Level of consciousness: awake and alert Pain management: pain level controlled Vital Signs Assessment: post-procedure vital signs reviewed and stable Respiratory status: spontaneous breathing, nonlabored ventilation, respiratory function stable and patient connected to nasal cannula oxygen Cardiovascular status: blood pressure returned to baseline and stable Postop Assessment: no apparent nausea or vomiting Anesthetic complications: no  No notable events documented.  Last Vitals:  Vitals:   08/14/22 1806 08/14/22 1930  BP:  124/70  Pulse:  (!) 106  Resp:  20  Temp:  36.6 C  SpO2: 95% 95%    Last Pain:  Vitals:   08/14/22 1930  TempSrc: Oral  PainSc:                  Chasty Randal,W. EDMOND

## 2022-08-14 NOTE — ED Provider Notes (Signed)
[place] EMERGENCY DEPARTMENT AT [facility] Provider Note   CSN: [id number] Arrival date & time: 08/14/22  0319     History  Chief Complaint  Patient presents with   Fever    KENEESHA QUEST is a 62 y.o. female.  HPI     This is a 62 year old female who presents with fever and abdominal pain.  Patient reports that she has noted fever at home to 102.  She reports left lower quadrant abdominal pain and chills.  Denies any cough or shortness of breath.  No known sick contacts.  Does report nausea and some diarrhea but no significant vomiting.  Denies specific urinary complaints.  Home Medications Prior to Admission medications   Medication Sig Start Date End Date Taking? Authorizing Provider  albuterol (VENTOLIN HFA) 108 (90 Base) MCG/ACT inhaler INHALE 2 PUFFS INTO THE LUNGS EVERY 6 HOURS AS NEEDED 07/28/22   Corwin Levins, MD  atorvastatin (LIPITOR) 20 MG tablet TAKE 1 TABLET BY MOUTH EVERY DAY 10/03/21   Corwin Levins, MD  Brimonidine Tartrate (LUMIFY) 0.025 % SOLN Place 1 drop into both eyes daily as needed (redness).    [provider]  calcium carbonate (TUMS - DOSED IN MG ELEMENTAL CALCIUM) 500 MG chewable tablet Chew 1 tablet by mouth daily as needed for indigestion or heartburn.    [provider]  ELIQUIS 5 MG TABS tablet Take 5 mg by mouth 2 (two) times daily. 01/19/22   [provider]  famotidine (PEPCID) 20 MG tablet Take 1 tablet (20 mg total) by mouth 2 (two) times daily. 11/13/21   Cobb, Ruby Cola, NP  LORazepam (ATIVAN) 0.5 MG tablet TAKE 1 TABLET BY MOUTH 2 TIMES DAILY AS NEEDED FOR ANXIETY. 03/25/22   Corwin Levins, MD  losartan-hydrochlorothiazide (HYZAAR) 100-25 MG tablet Take 1 tablet by mouth daily. 06/20/22   Corwin Levins, MD  methocarbamol (ROBAXIN) 500 MG tablet TAKE 1 TABLET BY MOUTH 4 TIMES DAILY. Patient not taking: Reported on 05/25/2022 04/11/22   Corwin Levins, MD  metoprolol succinate (TOPROL-XL) 25 MG 24 hr tablet  TAKE 1 TABLET (25 MG TOTAL) BY MOUTH DAILY. APPOINTMENT REQUIRED FOR FURTHER REFILLS [phone] 07/14/22   Fenton, Clint R, PA  mometasone-formoterol (DULERA) 100-5 MCG/ACT AERO Inhale 2 puffs into the lungs in the morning and at bedtime. 11/19/21   Cobb, Ruby Cola, NP  Multiple Vitamin (MULTIVITAMIN WITH MINERALS) TABS tablet Take 1 tablet by mouth daily. 09/11/21   Glade Lloyd, MD  ondansetron (ZOFRAN-ODT) 4 MG disintegrating tablet TAKE 1 TABLET BY MOUTH EVERY 8 HOURS AS NEEDED FOR NAUSEA AND VOMITING 07/16/22   Corwin Levins, MD  ondansetron (ZOFRAN-ODT) 4 MG disintegrating tablet Take 4 mg by mouth every 8 (eight) hours as needed. 06/02/22   [provider]  pantoprazole (PROTONIX) 40 MG tablet Take 1 tablet (40 mg total) by mouth daily. 11/19/21   Cobb, Ruby Cola, NP  triamcinolone cream (KENALOG) 0.1 % APPLY 1 APPLICATION TOPICALLY 2 (TWO) TIMES DAILY AS NEEDED (ITCHING). 06/20/22   Corwin Levins, MD      Allergies    Morphine, Shrimp [shellfish allergy], Tramadol, Covid-19 (mrna) vaccine, Diltiazem hcl, and Latex    Review of Systems   Review of Systems  Constitutional:  Positive for fever.  Respiratory:  Negative for cough and shortness of breath.   Cardiovascular:  Negative for chest pain.  Gastrointestinal:  Positive for abdominal pain, diarrhea and nausea. Negative for vomiting.  Genitourinary:  Negative for dysuria.  All other systems reviewed and are negative.   Physical Exam Updated Vital Signs BP 107/66 (BP Location: Right Arm)   Pulse (!) 109   Temp 98.9 F (37.2 C) (Oral)   Resp (!) 28   Ht 1.549 m (5\' 1" )   Wt 133.4 kg   SpO2 91%   BMI 55.55 kg/m  Physical Exam Vitals and nursing note reviewed.  Constitutional:      Appearance: She is well-developed. She is obese. She is not ill-appearing.  HENT:     Head: Normocephalic and atraumatic.  Eyes:     Pupils: Pupils are equal, round, and reactive to light.  Cardiovascular:     Rate and Rhythm:  Regular rhythm. Tachycardia present.     Heart sounds: Normal heart sounds.  Pulmonary:     Effort: Pulmonary effort is normal. No respiratory distress.     Breath sounds: No wheezing.  Abdominal:     General: Bowel sounds are normal.     Palpations: Abdomen is soft.     Tenderness: There is abdominal tenderness.     Comments: Left lower quadrant tenderness to palpation, no rebound or guarding  Musculoskeletal:     Cervical back: Neck supple.  Skin:    General: Skin is warm and dry.  Neurological:     Mental Status: She is alert and oriented to person, place, and time.  Psychiatric:        Mood and Affect: Mood normal.     ED Results / Procedures / Treatments   Labs (all labs ordered are listed, but only abnormal results are displayed) Labs Reviewed  COMPREHENSIVE METABOLIC PANEL - Abnormal; Notable for the following components:      Result Value   Potassium 3.4 (*)    Glucose, Bld 142 (*)    Creatinine, Ser 1.35 (*)    Albumin 3.3 (*)    AST 55 (*)    ALT 45 (*)    Total Bilirubin 1.3 (*)    GFR, Estimated 45 (*)    All other components within normal limits  CBC WITH DIFFERENTIAL/PLATELET - Abnormal; Notable for the following components:   WBC 18.2 (*)    Neutro Abs 16.7 (*)    Lymphs Abs 0.5 (*)    Abs Immature Granulocytes 0.11 (*)    All other components within normal limits  URINALYSIS, W/ REFLEX TO CULTURE (INFECTION SUSPECTED) - Abnormal; Notable for the following components:   Color, Urine AMBER (*)    APPearance CLOUDY (*)    Hgb urine dipstick MODERATE (*)    Protein, ur 100 (*)    Leukocytes,Ua MODERATE (*)    Bacteria, UA FEW (*)    All other components within normal limits  CULTURE, BLOOD (ROUTINE X 2)  CULTURE, BLOOD (ROUTINE X 2)  URINE CULTURE  LACTIC ACID, PLASMA  PROTIME-INR  LACTIC ACID, PLASMA    EKG None  Radiology CT ABDOMEN PELVIS W CONTRAST  Result Date: 08/14/2022 CLINICAL DATA:  Left lower quadrant pain EXAM: CT ABDOMEN AND  PELVIS WITH CONTRAST TECHNIQUE: Multidetector CT imaging of the abdomen and pelvis was performed using the standard protocol following bolus administration of intravenous contrast. RADIATION DOSE REDUCTION: This exam was performed according to the departmental dose-optimization program which includes automated exposure control, adjustment of the mA and/or kV according to patient size and/or use of iterative reconstruction technique. CONTRAST:  60mL OMNIPAQUE IOHEXOL 350 MG/ML SOLN COMPARISON:  09/05/2021 FINDINGS: Lower chest:  Bands of scarring in  the subpleural right lower lobe. Hepatobiliary: Marked hepatic steatosis. Multiple relatively higher density nodules in the liver from known hemangiomas, numbering at least 4 and measuring up to 3.5 cm in the right lobe liver.No evidence of biliary obstruction or stone. Pancreas: Unremarkable. Spleen: Unremarkable. Adrenals/Urinary Tract: Negative adrenals. Left hydronephrosis and delayed renal excretion affecting the left kidney due to a proximal ureteric stone that measures 7 mm. Multiple calculi at the lower pole left kidney with conglomerate measuring up to 7 mm at the lower pole. Collapsed urinary bladder which contains gas and is indistinguishable from a small bowel loop in the pelvis high implantation of the left ureter at the bladder dome. Stomach/Bowel: No bowel obstruction or visible inflammation. Multiple colonic diverticula. Vascular/Lymphatic: Atheromatous calcification. No mass or adenopathy. Reproductive:Hysterectomy Other: No ascites or pneumoperitoneum. Musculoskeletal: Confluent spondylosis and ankylosis affecting the lower thoracic spine. Severe lumbar disc and facet degeneration. IMPRESSION: 1. Obstructing 7 mm stone in the proximal left ureter. Multiple left renal calculi. 2. Gas in the collapsed urinary bladder which is indistinguishable from pelvic small bowel loops, question persisting fistula. 3. Marked hepatic steatosis with hemangiomas.  Electronically Signed   By: Tiburcio Pea M.D.   On: 08/14/2022 06:14   DG Chest Portable 1 View  Result Date: 08/14/2022 CLINICAL DATA:  Shortness of breath.  Cough and chest pain EXAM: PORTABLE CHEST 1 VIEW COMPARISON:  03/24/2022 FINDINGS: Cardiomegaly and vascular pedicle widening. Low volume chest with vascular congestion. The left diaphragm is chronically obscured due to enlarged heart and mediastinal fat by most recent chest CT. No pneumothorax or convincing effusion. Artifact from EKG leads. IMPRESSION: Cardiomegaly and vascular congestion with low lung volumes. Electronically Signed   By: Tiburcio Pea M.D.   On: 08/14/2022 05:06    Procedures .Critical Care  Performed by: Shon Baton, MD Authorized by: Shon Baton, MD   Critical care provider statement:    Critical care time (minutes):  35   Critical care was necessary to treat or prevent imminent or life-threatening deterioration of the following conditions:  Sepsis   Critical care was time spent personally by me on the following activities:  Development of treatment plan with patient or surrogate, discussions with consultants, evaluation of patient's response to treatment, examination of patient, ordering and review of laboratory studies, ordering and review of radiographic studies, ordering and performing treatments and interventions, pulse oximetry, re-evaluation of patient's condition and review of old charts     Medications Ordered in ED Medications  cefTRIAXone (ROCEPHIN) 1 g in sodium chloride 0.9 % 100 mL IVPB (has no administration in time range)  ondansetron (ZOFRAN) injection 4 mg (4 mg Intravenous Given 08/14/22 0505)  fentaNYL (SUBLIMAZE) injection 50 mcg (50 mcg Intravenous Given 08/14/22 0508)  acetaminophen (TYLENOL) tablet 1,000 mg (1,000 mg Oral Given 08/14/22 0505)  sodium chloride 0.9 % bolus 1,000 mL (1,000 mLs Intravenous New Bag/Given 08/14/22 0508)  iohexol (OMNIPAQUE) 350 MG/ML injection 60 mL (60  mLs Intravenous Contrast Given 08/14/22 0531)    ED Course/ Medical Decision Making/ A&P Clinical Course as of 08/14/22 1610  Thu Aug 14, 2022  9604 Spoke to urology, Dr. Mena Goes.  He recommends keeping patient n.p.o. for possible stenting later today.  Will plan for admission to the hospitalist. [CH]    Clinical Course User Index [CH] Deakon Frix, Mayer Masker, MD  Medical Decision Making Amount and/or Complexity of Data Reviewed Labs: ordered. Radiology: ordered.  Risk OTC drugs. Prescription drug management. Decision regarding hospitalization.   This patient presents to the ED for concern of fever, abdominal pain, this involves an extensive number of treatment options, and is a complaint that carries with it a high risk of complications and morbidity.  I considered the following differential and admission for this acute, potentially life threatening condition.  The differential diagnosis includes diverticulitis, pyelonephritis, kidney stone  MDM:    This is a 62 year old female who presents with fever and abdominal pain.  She is febrile to 100.9 but reports fevers up to 102.  She is also tachycardic.  She was given a gram of Tylenol and fluids.  Sepsis workup was initiated.  She is not hypotensive.  She does have slightly reduced EF of 50%.  Chest x-ray shows vascular congestion and cardiomegaly.  Will avoid full fluid resuscitation until and unless she has hypotension or elevated lactate.  She was given 1 L of fluids.  Leukocytosis to 18.2.  Has a slight AKI with a creatinine of 1.3.  She is receiving fluid resuscitation.  Lactate is normal.  CT is concerning for obstructing 7 mm stone.  Urinalysis is consistent with UTI.  Concern for infected stone and sepsis.  She was given IV Rocephin.  On recheck, she is generally well-appearing.  Remains slightly tachycardic.  Urology was consulted.  See note above.  Of note, her CT does show possibility of a fistula but this  finding has been present on multiple prior CT scans.  She denies any symptoms.  (Labs, imaging, consults)  Labs: I Ordered, and personally interpreted labs.  The pertinent results include: CBC, CMP, urinalysis  Imaging Studies ordered: I ordered imaging studies including CT scan, chest x-ray I independently visualized and interpreted imaging. I agree with the radiologist interpretation  Additional history obtained from chart review.  External records from outside source obtained and reviewed including patient 1 year ago for the same  Cardiac Monitoring: The patient was maintained on a cardiac monitor.  If on the cardiac monitor, I personally viewed and interpreted the cardiac monitored which showed an underlying rhythm of: Sinus tachycardia  Reevaluation: After the interventions noted above, I reevaluated the patient and found that they have :stayed the same  Social Determinants of Health:  lives independently  Disposition: Admit  Co morbidities that complicate the patient evaluation  Past Medical History:  Diagnosis Date   Abscess of bladder 07/28/2016   Alcohol dependence (HCC)    Allergic rhinitis 12/06/2013   Anxiety    Asthma 03/16/2015   Atrial flutter with rapid ventricular response (HCC) 04/01/2020   COLONIC POLYPS, HX OF 04/05/2010   DIVERTICULITIS, HX OF 04/05/2010   DJD (degenerative joint disease)    right knee, mot to severe   Dyspnea    Dysrhythmia    GERD (gastroesophageal reflux disease)    no meds   Heart murmur    hx of    History of kidney stones    Hyperlipidemia    Hypertension    Impaired glucose tolerance 12/06/2013   Morbid obesity with BMI of 50.0-59.9, adult (HCC)    Nausea & vomiting 05/24/2022   Peripheral vascular disease (HCC)    Pneumonia    Pulmonary embolism (HCC)      Medicines Meds ordered this encounter  Medications   ondansetron (ZOFRAN) injection 4 mg   fentaNYL (SUBLIMAZE) injection 50 mcg   acetaminophen (TYLENOL)  tablet 1,000 mg   sodium chloride 0.9 % bolus 1,000 mL   iohexol (OMNIPAQUE) 350 MG/ML injection 60 mL   cefTRIAXone (ROCEPHIN) 1 g in sodium chloride 0.9 % 100 mL IVPB    Order Specific Question:   Antibiotic Indication:    Answer:   Other Indication (list below)    Order Specific Question:   Other Indication:    Answer:   pyelonephritis    I have reviewed the patients home medicines and have made adjustments as needed  Problem List / ED Course: Problem List Items Addressed This Visit       Other   Sepsis (HCC)   Other Visit Diagnoses     Kidney stone    -  Primary   Relevant Medications   fentaNYL (SUBLIMAZE) injection 50 mcg (Completed)                   Final Clinical Impression(s) / ED Diagnoses Final diagnoses:  Kidney stone  Sepsis, due to unspecified organism, unspecified whether acute organ dysfunction present [facility])    Rx / DC Orders ED Discharge Orders     None         Shon Baton, MD 08/14/22 [phone]

## 2022-08-14 NOTE — ED Notes (Signed)
RN provided pt supplies for pericare and brush her teeth. Pt ambulated to the restroom with steady gait.

## 2022-08-14 NOTE — Transfer of Care (Signed)
Immediate Anesthesia Transfer of Care Note  Patient: [name]  Procedure(s) Performed: CYSTOSCOPY WITH RETROGRADE PYELOGRAM, URETEROSCOPY AND STENT PLACEMENT (Left)  Patient Location: PACU  Anesthesia Type:General  Level of Consciousness: awake, alert , patient cooperative, and responds to stimulation  Airway & Oxygen Therapy: Patient Spontanous Breathing and Patient connected to face mask oxygen  Post-op Assessment: Report given to RN and Post -op Vital signs reviewed and stable  Post vital signs: Reviewed and stable  Last Vitals:  Vitals Value Taken Time  BP 153/130 08/14/22 1624  Temp    Pulse 126 08/14/22 1625  Resp 24 08/14/22 1625  SpO2 79 % 08/14/22 1625  Vitals shown include unvalidated device data.  Last Pain:  Vitals:   08/14/22 1138  TempSrc: Oral  PainSc:          Complications: No notable events documented.

## 2022-08-14 NOTE — Progress Notes (Signed)
Pt admitted to 6N01 from the ED via stretcher. Ambulated to the BR, stand by asst.  Will be going to OR later this afternoon.

## 2022-08-14 NOTE — Interval H&P Note (Signed)
History and Physical Interval Note:  08/14/2022 2:43 PM  [name]  has presented today for surgery, with the diagnosis of UTI AND LEFT  URETERAL STENT.  The various methods of treatment have been discussed with the patient and family. After consideration of risks, benefits and other options for treatment, the patient has consented to  Procedure(s): CYSTOSCOPY WITH RETROGRADE PYELOGRAM, URETEROSCOPY AND STENT PLACEMENT (Left) as a surgical intervention.  The patient's history has been reviewed, patient examined, no change in status, stable for surgery.  I have reviewed the patient's chart and labs.  Questions were answered to the patient's satisfaction.     [name]

## 2022-08-14 NOTE — H&P (Signed)
Reviewed case. To OR for cysto, L RPG, L stent placement. Will arrange for outpatient treatment.  -The risks, benefits and alternatives of cystoscopy with left JJ stent placement was discussed with the patient.  Risks include, but are not limited to: bleeding, urinary tract infection, ureteral injury, ureteral stricture disease, chronic pain, urinary symptoms, bladder injury, stent migration, the need for nephrostomy tube placement, MI, CVA, DVT, PE and the inherent risks with general anesthesia.  The patient voices understanding and wishes to proceed.   Matt R. Lynnann Knudsen MD [facility]  Pager: [phone]

## 2022-08-14 NOTE — Progress Notes (Signed)
PHARMACY - PHYSICIAN COMMUNICATION CRITICAL VALUE ALERT - BLOOD CULTURE IDENTIFICATION (BCID)  [name] is an 62 y.o. female who presented to [facility] on 08/14/2022 with a chief complaint of abd pain, fever  Assessment:  Pt with Ecoli bacteremia (suspected source - urine)  Name of physician (or Provider) Contacted: Dr. Ophelia Charter  Current antibiotics: Rocephin 1gm IV q24h  Changes to prescribed antibiotics recommended:  Will change Rocephin to 2gm IV q24h  Results for orders placed or performed during the hospital encounter of 08/14/22  Blood Culture ID Panel (Reflexed) (Collected: 08/14/2022  4:24 AM)  Result Value Ref Range   Enterococcus faecalis NOT DETECTED NOT DETECTED   Enterococcus Faecium NOT DETECTED NOT DETECTED   Listeria monocytogenes NOT DETECTED NOT DETECTED   Staphylococcus species NOT DETECTED NOT DETECTED   Staphylococcus aureus (BCID) NOT DETECTED NOT DETECTED   Staphylococcus epidermidis NOT DETECTED NOT DETECTED   Staphylococcus lugdunensis NOT DETECTED NOT DETECTED   Streptococcus species NOT DETECTED NOT DETECTED   Streptococcus agalactiae NOT DETECTED NOT DETECTED   Streptococcus pneumoniae NOT DETECTED NOT DETECTED   Streptococcus pyogenes NOT DETECTED NOT DETECTED   A.calcoaceticus-baumannii NOT DETECTED NOT DETECTED   Bacteroides fragilis NOT DETECTED NOT DETECTED   Enterobacterales DETECTED (A) NOT DETECTED   Enterobacter cloacae complex NOT DETECTED NOT DETECTED   Escherichia coli DETECTED (A) NOT DETECTED   Klebsiella aerogenes NOT DETECTED NOT DETECTED   Klebsiella oxytoca NOT DETECTED NOT DETECTED   Klebsiella pneumoniae NOT DETECTED NOT DETECTED   Proteus species NOT DETECTED NOT DETECTED   Salmonella species NOT DETECTED NOT DETECTED   Serratia marcescens NOT DETECTED NOT DETECTED   Haemophilus influenzae NOT DETECTED NOT DETECTED   Neisseria meningitidis NOT DETECTED NOT DETECTED   Pseudomonas aeruginosa NOT DETECTED NOT DETECTED    Stenotrophomonas maltophilia NOT DETECTED NOT DETECTED   Candida albicans NOT DETECTED NOT DETECTED   Candida auris NOT DETECTED NOT DETECTED   Candida glabrata NOT DETECTED NOT DETECTED   Candida krusei NOT DETECTED NOT DETECTED   Candida parapsilosis NOT DETECTED NOT DETECTED   Candida tropicalis NOT DETECTED NOT DETECTED   Cryptococcus neoformans/gattii NOT DETECTED NOT DETECTED   CTX-M ESBL NOT DETECTED NOT DETECTED   Carbapenem resistance IMP NOT DETECTED NOT DETECTED   Carbapenem resistance KPC NOT DETECTED NOT DETECTED   Carbapenem resistance NDM NOT DETECTED NOT DETECTED   Carbapenem resist OXA 48 LIKE NOT DETECTED NOT DETECTED   Carbapenem resistance VIM NOT DETECTED NOT DETECTED    Christoper Fabian, PharmD, BCPS Please see amion for complete clinical pharmacist phone list 08/14/2022  5:56 PM

## 2022-08-14 NOTE — Anesthesia Preprocedure Evaluation (Addendum)
Anesthesia Evaluation  Patient identified by MRN, date of birth, ID band Patient awake    Reviewed: Allergy & Precautions, H&P , NPO status , Patient's Chart, lab work & pertinent test results, reviewed documented beta blocker date and time   Airway Mallampati: III  TM Distance: >3 FB Neck ROM: Full    Dental no notable dental hx. (+) Teeth Intact, Dental Advisory Given   Pulmonary asthma , former smoker   Pulmonary exam normal breath sounds clear to auscultation       Cardiovascular hypertension, Pt. on medications and Pt. on home beta blockers + Peripheral Vascular Disease   Rhythm:Regular Rate:Normal     Neuro/Psych   Anxiety Depression    negative neurological ROS     GI/Hepatic Neg liver ROS,GERD  Medicated,,  Endo/Other    Morbid obesity  Renal/GU Renal disease  negative genitourinary   Musculoskeletal  (+) Arthritis , Osteoarthritis,    Abdominal   Peds  Hematology  (+) Blood dyscrasia, anemia   Anesthesia Other Findings   Reproductive/Obstetrics negative OB ROS                             Anesthesia Physical Anesthesia Plan  ASA: 3  Anesthesia Plan: General   Post-op Pain Management:    Induction: Intravenous  PONV Risk Score and Plan: 4 or greater and Ondansetron and Dexamethasone  Airway Management Planned: Oral ETT and Video Laryngoscope Planned  Additional Equipment:   Intra-op Plan:   Post-operative Plan: Extubation in OR  Informed Consent: I have reviewed the patients History and Physical, chart, labs and discussed the procedure including the risks, benefits and alternatives for the proposed anesthesia with the patient or authorized representative who has indicated his/her understanding and acceptance.     Dental advisory given  Plan Discussed with: CRNA  Anesthesia Plan Comments:        Anesthesia Quick Evaluation

## 2022-08-14 NOTE — H&P (Signed)
History and Physical    Patient: [name] AYT:[id number] DOB: Jun 09, 1960 DOA: 08/14/2022 DOS: the patient was seen and examined on 08/14/2022 PCP: Corwin Levins, MD  Patient coming from: Home - lives with husband; NOK: Susan, [name], [phone]   Chief Complaint: Abdominal pain, fever  HPI: [name] is a 62 y.o. female with medical history significant of bladder abscess, ETOH dependence, afib, HTN, HLD,  PE, impaired glucose tolerance, and morbid obesity presenting with abdominal pain, fever.  She reports that she started with high fevers up to 104 up to 5 days ago, with LLL abdominal pain.  She has h/o kidney stones and the pain was similar but the fever was a new issue.      ER Course:  Infected kidney stone, fever.  Urology consulted.  NPO for stenting.  Given Rocephin. Not septic.       Review of Systems: As mentioned in the history of present illness. All other systems reviewed and are negative. Past Medical History:  Diagnosis Date   Abscess of bladder 07/28/2016   Alcohol dependence (HCC)    Allergic rhinitis 12/06/2013   Anxiety    Asthma 03/16/2015   Atrial flutter with rapid ventricular response (HCC) 04/01/2020   COLONIC POLYPS, HX OF 04/05/2010   DIVERTICULITIS, HX OF 04/05/2010   DJD (degenerative joint disease)    right knee, mot to severe   GERD (gastroesophageal reflux disease)    no meds   Heart murmur    hx of    History of kidney stones    Hyperlipidemia    Hypertension    Impaired glucose tolerance 12/06/2013   Morbid obesity with BMI of 50.0-59.9, adult (HCC)    Nausea & vomiting 05/24/2022   Peripheral vascular disease (HCC)    Pulmonary embolism (HCC)    Past Surgical History:  Procedure Laterality Date   ABDOMINAL HYSTERECTOMY  age 78   fibroids   BREAST BIOPSY Left    COLONOSCOPY WITH PROPOFOL N/A 07/25/2016   Procedure: COLONOSCOPY WITH PROPOFOL;  Surgeon: Jeani Hawking, MD;  Location: [facility];  Service: Endoscopy;   Laterality: N/A;   colonscopy     x 2   CYSTOSCOPY W/ URETERAL STENT PLACEMENT Right 09/05/2021   Procedure: CYSTOSCOPY WITH RETROGRADE PYELOGRAM/URETERAL STENT PLACEMENT;  Surgeon: [name], MD;  Location: [facility];  Service: Urology;  Laterality: Right;   CYSTOSCOPY/URETEROSCOPY/HOLMIUM LASER/STENT PLACEMENT Right 09/30/2021   Procedure: CYSTOSCOPY/ RETROGRADE/URETEROSCOPY/HOLMIUM LASER/STENT PLACEMENT;  Surgeon: [name], MD;  Location: [facility];  Service: Urology;  Laterality: Right;   IR RADIOLOGIST EVAL & MGMT  08/12/2016   IR RADIOLOGIST EVAL & MGMT  08/21/2016   KNEE ARTHROSCOPY     left    TOTAL KNEE ARTHROPLASTY  07/29/2011   Procedure: TOTAL KNEE ARTHROPLASTY;  Surgeon: Shelda Pal, MD;  Location: [facility];  Service: Orthopedics;  Laterality: Right;   TOTAL KNEE ARTHROPLASTY Left 12/14/2012   Procedure: LEFT TOTAL KNEE ARTHROPLASTY;  Surgeon: Shelda Pal, MD;  Location: [facility];  Service: Orthopedics;  Laterality: Left;   Social History:  reports that she quit smoking about 39 years ago. Her smoking use included cigarettes. She has a 3.50 pack-year smoking history. She has never used smokeless tobacco. She reports that she does not currently use alcohol. She reports that she does not currently use drugs after having used the following drugs: Marijuana.  Allergies  Allergen Reactions   Morphine Itching   Shrimp [Shellfish Allergy] Itching and Other (  See Comments)    Tongue burns also   Tramadol Other (See Comments)    Caused confusion   Covid-19 (Mrna) Vaccine Hives   Diltiazem Hcl Itching    Pt with itching of the feet when bolus given   Latex Rash    Family History  Problem Relation Age of Onset   Stroke Mother    COPD Father    Lymphoma Sister    Alcoholism Sister     Prior to Admission medications   Medication Sig Start Date End Date Taking? Authorizing Provider  albuterol (VENTOLIN HFA) 108 (90 Base) MCG/ACT inhaler INHALE 2 PUFFS INTO THE LUNGS  EVERY 6 HOURS AS NEEDED Patient taking differently: Inhale 2 puffs into the lungs every 6 (six) hours as needed for wheezing or shortness of breath. 07/28/22  Yes Corwin Levins, MD  atorvastatin (LIPITOR) 20 MG tablet TAKE 1 TABLET BY MOUTH EVERY DAY 10/03/21  Yes Corwin Levins, MD  Brimonidine Tartrate (LUMIFY) 0.025 % SOLN Place 1 drop into both eyes daily as needed (redness).   Yes [provider]  calcium carbonate (TUMS - DOSED IN MG ELEMENTAL CALCIUM) 500 MG chewable tablet Chew 1 tablet by mouth daily as needed for indigestion or heartburn.   Yes [provider]  ELIQUIS 5 MG TABS tablet Take 5 mg by mouth 2 (two) times daily. 01/19/22  Yes [provider]  famotidine (PEPCID) 20 MG tablet Take 1 tablet (20 mg total) by mouth 2 (two) times daily. 11/13/21  Yes Cobb, Ruby Cola, NP  LORazepam (ATIVAN) 0.5 MG tablet TAKE 1 TABLET BY MOUTH 2 TIMES DAILY AS NEEDED FOR ANXIETY. 03/25/22  Yes Corwin Levins, MD  losartan-hydrochlorothiazide (HYZAAR) 100-25 MG tablet Take 1 tablet by mouth daily. 06/20/22  Yes Corwin Levins, MD  metoprolol succinate (TOPROL-XL) 25 MG 24 hr tablet TAKE 1 TABLET (25 MG TOTAL) BY MOUTH DAILY. APPOINTMENT REQUIRED FOR FURTHER REFILLS [phone] 07/14/22  Yes Fenton, Clint R, PA  mometasone-formoterol (DULERA) 100-5 MCG/ACT AERO Inhale 2 puffs into the lungs in the morning and at bedtime. 11/19/21  Yes Cobb, Ruby Cola, NP  ondansetron (ZOFRAN-ODT) 4 MG disintegrating tablet TAKE 1 TABLET BY MOUTH EVERY 8 HOURS AS NEEDED FOR NAUSEA AND VOMITING Patient taking differently: Take 4 mg by mouth every 8 (eight) hours as needed for nausea or vomiting. 07/16/22  Yes Corwin Levins, MD  pantoprazole (PROTONIX) 40 MG tablet Take 1 tablet (40 mg total) by mouth daily. 11/19/21  Yes Noemi Chapel, NP    Physical Exam: Vitals:   08/14/22 1624 08/14/22 1630 08/14/22 1645 08/14/22 1655  BP: 127/77 126/76 132/73 138/70  Pulse: (!) 139 (!) 119 (!) 114 (!) 116   Resp: 20 (!) 28 (!) 22 (!) 25  Temp: 98 F (36.7 C)   98.6 F (37 C)  TempSrc:      SpO2: 100% 93% 96% 91%  Weight:      Height:       General:  Appears calm and comfortable and is in NAD Eyes:  EOMI, normal lids, iris ENT:  grossly normal hearing, lips & tongue, mmm Neck:  no LAD, masses or thyromegaly Cardiovascular:  RR with mild tachycardia, no m/r/g. No LE edema.  Respiratory:   CTA bilaterally with no wheezes/rales/rhonchi.  Normal respiratory effort. Abdomen:  soft, LLQ TTP - limited exam due to habitus Skin:  no rash or induration seen on limited exam Musculoskeletal:  grossly normal tone BUE/BLE, good ROM, no bony  abnormality Psychiatric:  grossly normal mood and affect, speech fluent and appropriate, AOx3 Neurologic:  CN 2-12 grossly intact, moves all extremities in coordinated fashion   Radiological Exams on Admission: Independently reviewed - see discussion in A/P where applicable  CT ABDOMEN PELVIS W CONTRAST  Result Date: 08/14/2022 CLINICAL DATA:  Left lower quadrant pain EXAM: CT ABDOMEN AND PELVIS WITH CONTRAST TECHNIQUE: Multidetector CT imaging of the abdomen and pelvis was performed using the standard protocol following bolus administration of intravenous contrast. RADIATION DOSE REDUCTION: This exam was performed according to the departmental dose-optimization program which includes automated exposure control, adjustment of the mA and/or kV according to patient size and/or use of iterative reconstruction technique. CONTRAST:  60mL OMNIPAQUE IOHEXOL 350 MG/ML SOLN COMPARISON:  09/05/2021 FINDINGS: Lower chest:  Bands of scarring in the subpleural right lower lobe. Hepatobiliary: Marked hepatic steatosis. Multiple relatively higher density nodules in the liver from known hemangiomas, numbering at least 4 and measuring up to 3.5 cm in the right lobe liver.No evidence of biliary obstruction or stone. Pancreas: Unremarkable. Spleen: Unremarkable. Adrenals/Urinary Tract:  Negative adrenals. Left hydronephrosis and delayed renal excretion affecting the left kidney due to a proximal ureteric stone that measures 7 mm. Multiple calculi at the lower pole left kidney with conglomerate measuring up to 7 mm at the lower pole. Collapsed urinary bladder which contains gas and is indistinguishable from a small bowel loop in the pelvis high implantation of the left ureter at the bladder dome. Stomach/Bowel: No bowel obstruction or visible inflammation. Multiple colonic diverticula. Vascular/Lymphatic: Atheromatous calcification. No mass or adenopathy. Reproductive:Hysterectomy Other: No ascites or pneumoperitoneum. Musculoskeletal: Confluent spondylosis and ankylosis affecting the lower thoracic spine. Severe lumbar disc and facet degeneration. IMPRESSION: 1. Obstructing 7 mm stone in the proximal left ureter. Multiple left renal calculi. 2. Gas in the collapsed urinary bladder which is indistinguishable from pelvic small bowel loops, question persisting fistula. 3. Marked hepatic steatosis with hemangiomas. Electronically Signed   By: Tiburcio Pea M.D.   On: 08/14/2022 06:14   DG Chest Portable 1 View  Result Date: 08/14/2022 CLINICAL DATA:  Shortness of breath.  Cough and chest pain EXAM: PORTABLE CHEST 1 VIEW COMPARISON:  03/24/2022 FINDINGS: Cardiomegaly and vascular pedicle widening. Low volume chest with vascular congestion. The left diaphragm is chronically obscured due to enlarged heart and mediastinal fat by most recent chest CT. No pneumothorax or convincing effusion. Artifact from EKG leads. IMPRESSION: Cardiomegaly and vascular congestion with low lung volumes. Electronically Signed   By: Tiburcio Pea M.D.   On: 08/14/2022 05:06    EKG: Independently reviewed.  Sinus tachycardia with rate 105; nonspecific ST changes with no evidence of acute ischemia   Labs on Admission: I have personally reviewed the available labs and imaging studies at the time of the  admission.  Pertinent labs:    K+ 3.4 Glucose 142 BUN 16/Creatinine 1.35/GFR 45 Albumin 3.3 AST 55/ALT 45/Bili 1.3 Lactate 1.7, 1.4 WBC 18.2 UA: moderate Hgb, moderate LE, 100 protein, few bacteria   Assessment and Plan: Principal Problem:   Acute unilateral obstructive uropathy Active Problems:   Essential hypertension   History of pulmonary embolism   PAF (paroxysmal atrial fibrillation) (HCC)   Morbid obesity with BMI of 50.0-59.9, adult (HCC)   Hyperlipidemia   Alcohol dependence with withdrawal (HCC)   Asthma, moderate persistent, poorly-controlled   Prediabetes   Chronic kidney disease, stage 3a (HCC)    Obstructive uropathy with UTI -Patient with prior h/o stones presenting with LLQ/L flank  pain -Imaging indicates a 7 mm obstructing L proximal ureteral calculus as well as multiple other renal calculi -There is also suggestive of persistent fistula (present during prior admission) -UA is also concerning for infection -Will admit to telemetry -Urology has been consulted and is planning to place a stent later today -Will provide IVF hydration and pain control -Continue Rocephin for now   Stage 3a CKD -Appears to be stable at this time, possibly slightly worse than baseline -Attempt to avoid nephrotoxic medications -Will hold Hyzaar for now -Recheck BMP in AM   ETOH Dependence -She reports that she is no longer drinking daily, now binge drinks maybe a pint on the weekend which is improved from her daily fifth in the past -Will order CIWA protocol -Thiamine, folate, MVI ordered -Symptom-based treatment with Ativan  HTN -Continue Toprol XL -Hold Hyzaar  HLD -Continue atorvastatin  Impaired glucose tolerance -Recent A1c was 6.1, good control -No on home medications -Will follow without medication at this time  H/o PE/Afib -Rate controlled with Toprol XL -Continue Eliquis (resume on night of admission)  Morbid obesity -Body mass index is 55.55  kg/m..  -Weight loss should be encouraged -Outpatient PCP/bariatric medicine/bariatric surgery f/u encouraged   Asthma -Continue Albuterol, Dulera -There is also a probable OHS component    Advance Care Planning:   Code Status: Full Code - Code status was discussed with the patient and/or family at the time of admission.  The patient would want to receive full resuscitative measures at this time.   Consults: Urology; [facility] team; nutrition; PT/OT  DVT Prophylaxis: Eliquis/SCDs  Family Communication: None present; she did not request that I contact her husband at the time of admission  Severity of Illness: The appropriate patient status for this patient is INPATIENT. Inpatient status is judged to be reasonable and necessary in order to provide the required intensity of service to ensure the patient's safety. The patient's presenting symptoms, physical exam findings, and initial radiographic and laboratory data in the context of their chronic comorbidities is felt to place them at high risk for further clinical deterioration. Furthermore, it is not anticipated that the patient will be medically stable for discharge from the hospital within 2 midnights of admission.   * I certify that at the point of admission it is my clinical judgment that the patient will require inpatient hospital care spanning beyond 2 midnights from the point of admission due to high intensity of service, high risk for further deterioration and high frequency of surveillance required.*  Author: Jonah Blue, MD 08/14/2022 5:23 PM  For on call review [email or web address].

## 2022-08-14 NOTE — ED Triage Notes (Signed)
Pt reports abd pain onset last week with burning and aching feeling. Hx of diverticulitis. Noticed a fever this morning. Tmax 102. Pt room air sats at 90% on arrival, placed on 2L with improvement

## 2022-08-14 NOTE — Progress Notes (Signed)
Pt received back from the pacu with foley intact draining pink tinged urine, clear. Pt ambulated to the bathroom and had some gas.

## 2022-08-14 NOTE — Consult Note (Addendum)
consultation: Left ureteral stone, UTI Requested by: Dr. Courtney Horton  History of Present Illness: [name] is a 62-year-old female with a history of stones and history of possible fistula to her bladder after a diverticular abscess in 2018.  She presented early this morning with fever and abdominal pain.  She reports a temp of 103 to me and was 100.9 here. She did feel well. She has been tachycardic, white count 18, UA with few bacteria, creatinine up slightly at 1.35, lactic acid 1.7.  CT scan of the abdomen and pelvis was obtained this revealed a 7 mm left proximal stone with hydronephrosis and delayed left nephrogram.  There were some stones in the left lower pole of 12 mm and 7 mm. She denies any dysuria or pneumaturia.    Past Medical History:  Diagnosis Date   Abscess of bladder 07/28/2016   Alcohol dependence (HCC)    Allergic rhinitis 12/06/2013   Anxiety    Asthma 03/16/2015   Atrial flutter with rapid ventricular response (HCC) 04/01/2020   COLONIC POLYPS, HX OF 04/05/2010   DIVERTICULITIS, HX OF 04/05/2010   DJD (degenerative joint disease)    right knee, mot to severe   Dyspnea    Dysrhythmia    GERD (gastroesophageal reflux disease)    no meds   Heart murmur    hx of    History of kidney stones    Hyperlipidemia    Hypertension    Impaired glucose tolerance 12/06/2013   Morbid obesity with BMI of 50.0-59.9, adult (HCC)    Nausea & vomiting 05/24/2022   Peripheral vascular disease (HCC)    Pneumonia    Pulmonary embolism (HCC)    Past Surgical History:  Procedure Laterality Date   ABDOMINAL HYSTERECTOMY  age 36   fibroids   BREAST BIOPSY Left    COLONOSCOPY WITH PROPOFOL N/A 07/25/2016   Procedure: COLONOSCOPY WITH PROPOFOL;  Surgeon: Hung, Patrick, MD;  Location: [facility];  Service: Endoscopy;  Laterality: N/A;   colonscopy     x 2   CYSTOSCOPY W/ URETERAL STENT PLACEMENT Right 09/05/2021   Procedure: CYSTOSCOPY WITH RETROGRADE PYELOGRAM/URETERAL STENT  PLACEMENT;  Surgeon: Gay, Traveion Ruddock R, MD;  Location: [facility];  Service: Urology;  Laterality: Right;   CYSTOSCOPY/URETEROSCOPY/HOLMIUM LASER/STENT PLACEMENT Right 09/30/2021   Procedure: CYSTOSCOPY/ RETROGRADE/URETEROSCOPY/HOLMIUM LASER/STENT PLACEMENT;  Surgeon: Gay, Farid Grigorian R, MD;  Location: [facility];  Service: Urology;  Laterality: Right;   IR RADIOLOGIST EVAL & MGMT  08/12/2016   IR RADIOLOGIST EVAL & MGMT  08/21/2016   KNEE ARTHROSCOPY     left    TOTAL KNEE ARTHROPLASTY  07/29/2011   Procedure: TOTAL KNEE ARTHROPLASTY;  Surgeon: Baylin Cabal D Olin, MD;  Location: [facility];  Service: Orthopedics;  Laterality: Right;   TOTAL KNEE ARTHROPLASTY Left 12/14/2012   Procedure: LEFT TOTAL KNEE ARTHROPLASTY;  Surgeon: Khamil Lamica D Olin, MD;  Location: [facility];  Service: Orthopedics;  Laterality: Left;    Home Medications:  (Not in a hospital admission)  Allergies:  Allergies  Allergen Reactions   Morphine Itching   Shrimp [Shellfish Allergy] Itching and Other (See Comments)    Tongue burns also   Tramadol Other (See Comments)    Caused confusion   Covid-19 (Mrna) Vaccine Hives   Diltiazem Hcl Itching    Pt with itching of the feet when bolus given   Latex Rash    Family History  Problem Relation Age of Onset   Stroke Mother    COPD Father      Lymphoma Sister    Alcoholism Sister    Social History:  reports that she quit smoking about 39 years ago. Her smoking use included cigarettes. She has a 3.50 pack-year smoking history. She has never used smokeless tobacco. She reports that she does not currently use alcohol. She reports that she does not currently use drugs after having used the following drugs: Marijuana.  ROS: A complete review of systems was performed.  All systems are negative except for pertinent findings as noted. Review of Systems  All other systems reviewed and are negative.    Physical Exam:  Vital signs in last 24 hours: Temp:  [98.9 F (37.2 C)-100.9 F (38.3 C)] 98.9  F (37.2 C) (07/04 0615) Pulse Rate:  [106-125] 116 (07/04 0700) Resp:  [11-30] 30 (07/04 0700) BP: (101-130)/(60-87) 126/87 (07/04 0700) SpO2:  [91 %-96 %] 94 % (07/04 0700) Weight:  [133.4 kg] 133.4 kg (07/04 0437) HR 109 on monitor and RR 22.  General:  Alert and oriented, No acute distress, sitting on side of bed HEENT: Normocephalic, atraumatic Cardiovascular: Regular rate and rhythm Lungs: Regular rate and effort Abdomen: Soft, nontender, nondistended, no abdominal masses Back: No CVA tenderness Extremities: No edema Neurologic: Grossly intact  Laboratory Data:  Results for orders placed or performed during the hospital encounter of 08/14/22 (from the past 24 hour(s))  Comprehensive metabolic panel     Status: Abnormal   Collection Time: 08/14/22  4:24 AM  Result Value Ref Range   Sodium 137 135 - 145 mmol/L   Potassium 3.4 (L) 3.5 - 5.1 mmol/L   Chloride 98 98 - 111 mmol/L   CO2 24 22 - 32 mmol/L   Glucose, Bld 142 (H) 70 - 99 mg/dL   BUN 16 8 - 23 mg/dL   Creatinine, Ser 1.35 (H) 0.44 - 1.00 mg/dL   Calcium 9.2 8.9 - 10.3 mg/dL   Total Protein 7.3 6.5 - 8.1 g/dL   Albumin 3.3 (L) 3.5 - 5.0 g/dL   AST 55 (H) 15 - 41 U/L   ALT 45 (H) 0 - 44 U/L   Alkaline Phosphatase 92 38 - 126 U/L   Total Bilirubin 1.3 (H) 0.3 - 1.2 mg/dL   GFR, Estimated 45 (L) >60 mL/min   Anion gap 15 5 - 15  Lactic acid, plasma     Status: None   Collection Time: 08/14/22  4:24 AM  Result Value Ref Range   Lactic Acid, Venous 1.7 0.5 - 1.9 mmol/L  CBC with Differential     Status: Abnormal   Collection Time: 08/14/22  4:24 AM  Result Value Ref Range   WBC 18.2 (H) 4.0 - 10.5 K/uL   RBC 4.63 3.87 - 5.11 MIL/uL   Hemoglobin 14.1 12.0 - 15.0 g/dL   HCT 44.3 36.0 - 46.0 %   MCV 95.7 80.0 - 100.0 fL   MCH 30.5 26.0 - 34.0 pg   MCHC 31.8 30.0 - 36.0 g/dL   RDW 13.6 11.5 - 15.5 %   Platelets 213 150 - 400 K/uL   nRBC 0.0 0.0 - 0.2 %   Neutrophils Relative % 92 %   Neutro Abs 16.7 (H) 1.7 -  7.7 K/uL   Lymphocytes Relative 3 %   Lymphs Abs 0.5 (L) 0.7 - 4.0 K/uL   Monocytes Relative 4 %   Monocytes Absolute 0.7 0.1 - 1.0 K/uL   Eosinophils Relative 0 %   Eosinophils Absolute 0.1 0.0 - 0.5 K/uL   Basophils Relative 0 %     Basophils Absolute 0.1 0.0 - 0.1 K/uL   Immature Granulocytes 1 %   Abs Immature Granulocytes 0.11 (H) 0.00 - 0.07 K/uL  Protime-INR     Status: None   Collection Time: 08/14/22  4:24 AM  Result Value Ref Range   Prothrombin Time 14.6 11.4 - 15.2 seconds   INR 1.1 0.8 - 1.2  Urinalysis, w/ Reflex to Culture (Infection Suspected) -Urine, Clean Catch     Status: Abnormal   Collection Time: 08/14/22  5:30 AM  Result Value Ref Range   Specimen Source URINE, CATHETERIZED    Color, Urine AMBER (A) YELLOW   APPearance CLOUDY (A) CLEAR   Specific Gravity, Urine 1.023 1.005 - 1.030   pH 5.0 5.0 - 8.0   Glucose, UA NEGATIVE NEGATIVE mg/dL   Hgb urine dipstick MODERATE (A) NEGATIVE   Bilirubin Urine NEGATIVE NEGATIVE   Ketones, ur NEGATIVE NEGATIVE mg/dL   Protein, ur 100 (A) NEGATIVE mg/dL   Nitrite NEGATIVE NEGATIVE   Leukocytes,Ua MODERATE (A) NEGATIVE   RBC / HPF >50 0 - 5 RBC/hpf   WBC, UA >50 0 - 5 WBC/hpf   Bacteria, UA FEW (A) NONE SEEN   Squamous Epithelial / HPF 21-50 0 - 5 /HPF   Mucus PRESENT    No results found for this or any previous visit (from the past 240 hour(s)). Creatinine: Recent Labs    08/14/22 0424  CREATININE 1.35*    Impression/Assessment:  Left proximal ureteral stone, left renal stones, fever with leukocytosis and possible UTI-  Plan:  I discussed with the patient the nature, potential benefits, risks and alternatives to cystoscopy with left retrograde pyelogram and left ureteral stent placement, including side effects of the proposed treatment, the likelihood of the patient achieving the goals of the procedure, and any potential problems that might occur during the procedure or recuperation.  I discussed with her Dr. Gay  would be doing the procedure today. Also discussed temporary nature of a stent and importance of follow-up for ureteroscopy and the rationale for a staged procedure. All questions answered. Patient elects to proceed.    [name] 08/14/2022, 7:05 AM   

## 2022-08-14 NOTE — ED Notes (Signed)
ED TO INPATIENT HANDOFF REPORT  ED Nurse Name and Phone #: Nicholos Johns [phone]  S Name/Age/Gender [name] 62 y.o. female Room/Bed: 005C/005C  Code Status   Code Status: Prior  Home/SNF/Other Home Patient oriented to: self, place, time, and situation Is this baseline? Yes   Triage Complete: Triage complete  Chief Complaint Acute unilateral obstructive uropathy [N13.9]  Triage Note Pt reports abd pain onset last week with burning and aching feeling. Hx of diverticulitis. Noticed a fever this morning. Tmax 102. Pt room air sats at 90% on arrival, placed on 2L with improvement    Allergies Allergies  Allergen Reactions   Morphine Itching   Shrimp [Shellfish Allergy] Itching and Other (See Comments)    Tongue burns also   Tramadol Other (See Comments)    Caused confusion   Covid-19 (Mrna) Vaccine Hives   Diltiazem Hcl Itching    Pt with itching of the feet when bolus given   Latex Rash    Level of Care/Admitting Diagnosis ED Disposition     ED Disposition  Admit   Condition  --   Comment  Hospital Area: [facility] [[id number]]  Level of Care: Telemetry Medical [[id number]]  May admit patient to [facility] or [facility] if equivalent level of care is available:: Yes  Covid Evaluation: Asymptomatic - no recent exposure (last 10 days) testing not required  Diagnosis: Acute unilateral obstructive uropathy [[id number]]  Admitting Physician: Jonah Blue [[id number]]  Attending Physician: Jonah Blue [[id number]]  Certification:: I certify this patient will need inpatient services for at least 2 midnights  Estimated Length of Stay: 2          B Medical/Surgery History Past Medical History:  Diagnosis Date   Abscess of bladder 07/28/2016   Alcohol dependence (HCC)    Allergic rhinitis 12/06/2013   Anxiety    Asthma 03/16/2015   Atrial flutter with rapid ventricular response (HCC) 04/01/2020   COLONIC POLYPS, HX OF 04/05/2010   DIVERTICULITIS, HX OF  04/05/2010   DJD (degenerative joint disease)    right knee, mot to severe   GERD (gastroesophageal reflux disease)    no meds   Heart murmur    hx of    History of kidney stones    Hyperlipidemia    Hypertension    Impaired glucose tolerance 12/06/2013   Morbid obesity with BMI of 50.0-59.9, adult (HCC)    Nausea & vomiting 05/24/2022   Peripheral vascular disease (HCC)    Pulmonary embolism (HCC)    Past Surgical History:  Procedure Laterality Date   ABDOMINAL HYSTERECTOMY  age 63   fibroids   BREAST BIOPSY Left    COLONOSCOPY WITH PROPOFOL N/A 07/25/2016   Procedure: COLONOSCOPY WITH PROPOFOL;  Surgeon: Jeani Hawking, MD;  Location: [facility];  Service: Endoscopy;  Laterality: N/A;   colonscopy     x 2   CYSTOSCOPY W/ URETERAL STENT PLACEMENT Right 09/05/2021   Procedure: CYSTOSCOPY WITH RETROGRADE PYELOGRAM/URETERAL STENT PLACEMENT;  Surgeon: [name], MD;  Location: [facility];  Service: Urology;  Laterality: Right;   CYSTOSCOPY/URETEROSCOPY/HOLMIUM LASER/STENT PLACEMENT Right 09/30/2021   Procedure: CYSTOSCOPY/ RETROGRADE/URETEROSCOPY/HOLMIUM LASER/STENT PLACEMENT;  Surgeon: [name], MD;  Location: [facility];  Service: Urology;  Laterality: Right;   IR RADIOLOGIST EVAL & MGMT  08/12/2016   IR RADIOLOGIST EVAL & MGMT  08/21/2016   KNEE ARTHROSCOPY     left    TOTAL KNEE ARTHROPLASTY  07/29/2011   Procedure: TOTAL KNEE ARTHROPLASTY;  Surgeon:  Shelda Pal, MD;  Location: [facility];  Service: Orthopedics;  Laterality: Right;   TOTAL KNEE ARTHROPLASTY Left 12/14/2012   Procedure: LEFT TOTAL KNEE ARTHROPLASTY;  Surgeon: Shelda Pal, MD;  Location: [facility];  Service: Orthopedics;  Laterality: Left;     A IV Location/Drains/Wounds Patient Lines/Drains/Airways Status     Active Line/Drains/Airways     Name Placement date Placement time Site Days   Peripheral IV 08/14/22 20 G Anterior;Left;Proximal Forearm 08/14/22  0504  Forearm  less than 1   Ureteral  Drain/Stent Right ureter 6 Fr. 09/30/21  1308  Right ureter  318            Intake/Output Last 24 hours  Intake/Output Summary (Last 24 hours) at 08/14/2022 0821 Last data filed at 08/14/2022 0704 Gross per 24 hour  Intake 1000 ml  Output --  Net 1000 ml    Labs/Imaging Results for orders placed or performed during the hospital encounter of 08/14/22 (from the past 48 hour(s))  Comprehensive metabolic panel     Status: Abnormal   Collection Time: 08/14/22  4:24 AM  Result Value Ref Range   Sodium 137 135 - 145 mmol/L   Potassium 3.4 (L) 3.5 - 5.1 mmol/L   Chloride 98 98 - 111 mmol/L   CO2 24 22 - 32 mmol/L   Glucose, Bld 142 (H) 70 - 99 mg/dL    Comment: Glucose reference range applies only to samples taken after fasting for at least 8 hours.   BUN 16 8 - 23 mg/dL   Creatinine, Ser 5.40 (H) 0.44 - 1.00 mg/dL   Calcium 9.2 8.9 - 98.1 mg/dL   Total Protein 7.3 6.5 - 8.1 g/dL   Albumin 3.3 (L) 3.5 - 5.0 g/dL   AST 55 (H) 15 - 41 U/L   ALT 45 (H) 0 - 44 U/L   Alkaline Phosphatase 92 38 - 126 U/L   Total Bilirubin 1.3 (H) 0.3 - 1.2 mg/dL   GFR, Estimated 45 (L) >60 mL/min    Comment: (NOTE) Calculated using the CKD-EPI Creatinine Equation (2021)    Anion gap 15 5 - 15    Comment: Performed at [facility], [address] [place], [place] [address]  Lactic acid, plasma     Status: None   Collection Time: 08/14/22  4:24 AM  Result Value Ref Range   Lactic Acid, Venous 1.7 0.5 - 1.9 mmol/L    Comment: Performed at [facility], [address] [place], [place] [address]  CBC with Differential     Status: Abnormal   Collection Time: 08/14/22  4:24 AM  Result Value Ref Range   WBC 18.2 (H) 4.0 - 10.5 K/uL   RBC 4.63 3.87 - 5.11 MIL/uL   Hemoglobin 14.1 12.0 - 15.0 g/dL   HCT 21.3 08.6 - 57.8 %   MCV 95.7 80.0 - 100.0 fL   MCH 30.5 26.0 - 34.0 pg   MCHC 31.8 30.0 - 36.0 g/dL   RDW 46.9 62.9 - 52.8 %   Platelets 213 150 - 400 K/uL   nRBC 0.0 0.0 - 0.2 %    Neutrophils Relative % 92 %   Neutro Abs 16.7 (H) 1.7 - 7.7 K/uL   Lymphocytes Relative 3 %   Lymphs Abs 0.5 (L) 0.7 - 4.0 K/uL   Monocytes Relative 4 %   Monocytes Absolute 0.7 0.1 - 1.0 K/uL   Eosinophils Relative 0 %   Eosinophils Absolute 0.1 0.0 -  0.5 K/uL   Basophils Relative 0 %   Basophils Absolute 0.1 0.0 - 0.1 K/uL   Immature Granulocytes 1 %   Abs Immature Granulocytes 0.11 (H) 0.00 - 0.07 K/uL    Comment: Performed at [facility], [address], [place], [place] [address]  Protime-INR     Status: None   Collection Time: 08/14/22  4:24 AM  Result Value Ref Range   Prothrombin Time 14.6 11.4 - 15.2 seconds   INR 1.1 0.8 - 1.2    Comment: (NOTE) INR goal varies based on device and disease states. Performed at [facility], [address] [place], [place] [address]   Urinalysis, w/ Reflex to Culture (Infection Suspected) -Urine, Clean Catch     Status: Abnormal   Collection Time: 08/14/22  5:30 AM  Result Value Ref Range   Specimen Source URINE, CATHETERIZED    Color, Urine AMBER (A) YELLOW    Comment: BIOCHEMICALS MAY BE AFFECTED BY COLOR   APPearance CLOUDY (A) CLEAR   Specific Gravity, Urine 1.023 1.005 - 1.030   pH 5.0 5.0 - 8.0   Glucose, UA NEGATIVE NEGATIVE mg/dL   Hgb urine dipstick MODERATE (A) NEGATIVE   Bilirubin Urine NEGATIVE NEGATIVE   Ketones, ur NEGATIVE NEGATIVE mg/dL   Protein, ur 098 (A) NEGATIVE mg/dL   Nitrite NEGATIVE NEGATIVE   Leukocytes,Ua MODERATE (A) NEGATIVE   RBC / HPF >50 0 - 5 RBC/hpf   WBC, UA >50 0 - 5 WBC/hpf    Comment:        Reflex urine culture not performed if WBC <=10, OR if Squamous epithelial cells >5. If Squamous epithelial cells >5 suggest recollection.    Bacteria, UA FEW (A) NONE SEEN   Squamous Epithelial / HPF 21-50 0 - 5 /HPF   Mucus PRESENT     Comment: Performed at [facility], [address] [place], [place] [address]  Lactic acid, plasma     Status: None   Collection Time: 08/14/22   6:45 AM  Result Value Ref Range   Lactic Acid, Venous 1.4 0.5 - 1.9 mmol/L    Comment: Performed at [facility], [address] [place], [place] [address]   CT ABDOMEN PELVIS W CONTRAST  Result Date: 08/14/2022 CLINICAL DATA:  Left lower quadrant pain EXAM: CT ABDOMEN AND PELVIS WITH CONTRAST TECHNIQUE: Multidetector CT imaging of the abdomen and pelvis was performed using the standard protocol following bolus administration of intravenous contrast. RADIATION DOSE REDUCTION: This exam was performed according to the departmental dose-optimization program which includes automated exposure control, adjustment of the mA and/or kV according to patient size and/or use of iterative reconstruction technique. CONTRAST:  60mL OMNIPAQUE IOHEXOL 350 MG/ML SOLN COMPARISON:  09/05/2021 FINDINGS: Lower chest:  Bands of scarring in the subpleural right lower lobe. Hepatobiliary: Marked hepatic steatosis. Multiple relatively higher density nodules in the liver from known hemangiomas, numbering at least 4 and measuring up to 3.5 cm in the right lobe liver.No evidence of biliary obstruction or stone. Pancreas: Unremarkable. Spleen: Unremarkable. Adrenals/Urinary Tract: Negative adrenals. Left hydronephrosis and delayed renal excretion affecting the left kidney due to a proximal ureteric stone that measures 7 mm. Multiple calculi at the lower pole left kidney with conglomerate measuring up to 7 mm at the lower pole. Collapsed urinary bladder which contains gas and is indistinguishable from a small bowel loop in the pelvis high implantation of the left ureter at the bladder dome. Stomach/Bowel: No bowel  obstruction or visible inflammation. Multiple colonic diverticula. Vascular/Lymphatic: Atheromatous calcification. No mass or adenopathy. Reproductive:Hysterectomy Other: No ascites or pneumoperitoneum. Musculoskeletal: Confluent spondylosis and ankylosis affecting the lower thoracic spine. Severe lumbar disc and facet  degeneration. IMPRESSION: 1. Obstructing 7 mm stone in the proximal left ureter. Multiple left renal calculi. 2. Gas in the collapsed urinary bladder which is indistinguishable from pelvic small bowel loops, question persisting fistula. 3. Marked hepatic steatosis with hemangiomas. Electronically Signed   By: Tiburcio Pea M.D.   On: 08/14/2022 06:14   DG Chest Portable 1 View  Result Date: 08/14/2022 CLINICAL DATA:  Shortness of breath.  Cough and chest pain EXAM: PORTABLE CHEST 1 VIEW COMPARISON:  03/24/2022 FINDINGS: Cardiomegaly and vascular pedicle widening. Low volume chest with vascular congestion. The left diaphragm is chronically obscured due to enlarged heart and mediastinal fat by most recent chest CT. No pneumothorax or convincing effusion. Artifact from EKG leads. IMPRESSION: Cardiomegaly and vascular congestion with low lung volumes. Electronically Signed   By: Tiburcio Pea M.D.   On: 08/14/2022 05:06    Pending Labs Unresulted Labs (From admission, onward)     Start     Ordered   08/14/22 0624  Urine Culture  Once,   URGENT       Question:  Indication  Answer:  Sepsis   08/14/22 0624   08/14/22 0414  Culture, blood (Routine x 2)  BLOOD CULTURE X 2,   R (with STAT occurrences)      08/14/22 0414            Vitals/Pain Today's Vitals   08/14/22 0645 08/14/22 0653 08/14/22 0700 08/14/22 0738  BP: 101/85  126/87   Pulse: (!) 109  (!) 116   Resp: 11  (!) 30   Temp:      TempSrc:      SpO2: 94%  94%   Weight:      Height:      PainSc:  8   8     Isolation Precautions No active isolations  Medications Medications  cefTRIAXone (ROCEPHIN) 1 g in sodium chloride 0.9 % 100 mL IVPB (has no administration in time range)  ondansetron (ZOFRAN) injection 4 mg (4 mg Intravenous Given 08/14/22 0505)  fentaNYL (SUBLIMAZE) injection 50 mcg (50 mcg Intravenous Given 08/14/22 0508)  acetaminophen (TYLENOL) tablet 1,000 mg (1,000 mg Oral Given 08/14/22 0505)  sodium chloride 0.9 %  bolus 1,000 mL (0 mLs Intravenous Stopped 08/14/22 0704)  iohexol (OMNIPAQUE) 350 MG/ML injection 60 mL (60 mLs Intravenous Contrast Given 08/14/22 0531)  cefTRIAXone (ROCEPHIN) 1 g in sodium chloride 0.9 % 100 mL IVPB (0 g Intravenous Stopped 08/14/22 0728)    Mobility walks     Focused Assessments Renal Assessment Handoff:    R Recommendations: See Admitting Provider Note  Report given to:   Additional Notes: here for stent placement in kidney

## 2022-08-14 NOTE — H&P (View-Only) (Signed)
consultation: Left ureteral stone, UTI Requested by: Dr. Ross Marcus  History of Present Illness: [name] is a 62 year old female with a history of stones and history of possible fistula to her bladder after a diverticular abscess in 2018.  She presented early this morning with fever and abdominal pain.  She reports a temp of 103 to me and was 100.9 here. She did feel well. She has been tachycardic, white count 18, UA with few bacteria, creatinine up slightly at 1.35, lactic acid 1.7.  CT scan of the abdomen and pelvis was obtained this revealed a 7 mm left proximal stone with hydronephrosis and delayed left nephrogram.  There were some stones in the left lower pole of 12 mm and 7 mm. She denies any dysuria or pneumaturia.    Past Medical History:  Diagnosis Date   Abscess of bladder 07/28/2016   Alcohol dependence (HCC)    Allergic rhinitis 12/06/2013   Anxiety    Asthma 03/16/2015   Atrial flutter with rapid ventricular response (HCC) 04/01/2020   COLONIC POLYPS, HX OF 04/05/2010   DIVERTICULITIS, HX OF 04/05/2010   DJD (degenerative joint disease)    right knee, mot to severe   Dyspnea    Dysrhythmia    GERD (gastroesophageal reflux disease)    no meds   Heart murmur    hx of    History of kidney stones    Hyperlipidemia    Hypertension    Impaired glucose tolerance 12/06/2013   Morbid obesity with BMI of 50.0-59.9, adult (HCC)    Nausea & vomiting 05/24/2022   Peripheral vascular disease (HCC)    Pneumonia    Pulmonary embolism (HCC)    Past Surgical History:  Procedure Laterality Date   ABDOMINAL HYSTERECTOMY  age 89   fibroids   BREAST BIOPSY Left    COLONOSCOPY WITH PROPOFOL N/A 07/25/2016   Procedure: COLONOSCOPY WITH PROPOFOL;  Surgeon: Jeani Hawking, MD;  [facility];  Service: Endoscopy;  Laterality: N/A;   colonscopy     x 2   CYSTOSCOPY W/ URETERAL STENT PLACEMENT Right 09/05/2021   Procedure: CYSTOSCOPY WITH RETROGRADE PYELOGRAM/URETERAL STENT  PLACEMENT;  Surgeon: [name], MD;  [facility];  Service: Urology;  Laterality: Right;   CYSTOSCOPY/URETEROSCOPY/HOLMIUM LASER/STENT PLACEMENT Right 09/30/2021   Procedure: CYSTOSCOPY/ RETROGRADE/URETEROSCOPY/HOLMIUM LASER/STENT PLACEMENT;  Surgeon: [name], MD;  [facility];  Service: Urology;  Laterality: Right;   IR RADIOLOGIST EVAL & MGMT  08/12/2016   IR RADIOLOGIST EVAL & MGMT  08/21/2016   KNEE ARTHROSCOPY     left    TOTAL KNEE ARTHROPLASTY  07/29/2011   Procedure: TOTAL KNEE ARTHROPLASTY;  Surgeon: Shelda Pal, MD;  [facility];  Service: Orthopedics;  Laterality: Right;   TOTAL KNEE ARTHROPLASTY Left 12/14/2012   Procedure: LEFT TOTAL KNEE ARTHROPLASTY;  Surgeon: Shelda Pal, MD;  [facility];  Service: Orthopedics;  Laterality: Left;    Home Medications:  (Not in a hospital admission)  Allergies:  Allergies  Allergen Reactions   Morphine Itching   Shrimp [Shellfish Allergy] Itching and Other (See Comments)    Tongue burns also   Tramadol Other (See Comments)    Caused confusion   Covid-19 (Mrna) Vaccine Hives   Diltiazem Hcl Itching    Pt with itching of the feet when bolus given   Latex Rash    Family History  Problem Relation Age of Onset   Stroke Mother    COPD Father  Lymphoma Sister    Alcoholism Sister    Social History:  reports that she quit smoking about 39 years ago. Her smoking use included cigarettes. She has a 3.50 pack-year smoking history. She has never used smokeless tobacco. She reports that she does not currently use alcohol. She reports that she does not currently use drugs after having used the following drugs: Marijuana.  ROS: A complete review of systems was performed.  All systems are negative except for pertinent findings as noted. Review of Systems  All other systems reviewed and are negative.    Physical Exam:  Vital signs in last 24 hours: Temp:  [98.9 F (37.2 C)-100.9 F (38.3 C)] 98.9  F (37.2 C) (07/04 0615) Pulse Rate:  [106-125] 116 (07/04 0700) Resp:  [11-30] 30 (07/04 0700) BP: (101-130)/(60-87) 126/87 (07/04 0700) SpO2:  [91 %-96 %] 94 % (07/04 0700) Weight:  [133.4 kg] 133.4 kg (07/04 0437) HR 109 on monitor and RR 22.  General:  Alert and oriented, No acute distress, sitting on side of bed HEENT: Normocephalic, atraumatic Cardiovascular: Regular rate and rhythm Lungs: Regular rate and effort Abdomen: Soft, nontender, nondistended, no abdominal masses Back: No CVA tenderness Extremities: No edema Neurologic: Grossly intact  Laboratory Data:  Results for orders placed or performed during the hospital encounter of 08/14/22 (from the past 24 hour(s))  Comprehensive metabolic panel     Status: Abnormal   Collection Time: 08/14/22  4:24 AM  Result Value Ref Range   Sodium 137 135 - 145 mmol/L   Potassium 3.4 (L) 3.5 - 5.1 mmol/L   Chloride 98 98 - 111 mmol/L   CO2 24 22 - 32 mmol/L   Glucose, Bld 142 (H) 70 - 99 mg/dL   BUN 16 8 - 23 mg/dL   Creatinine, Ser 1.61 (H) 0.44 - 1.00 mg/dL   Calcium 9.2 8.9 - 09.6 mg/dL   Total Protein 7.3 6.5 - 8.1 g/dL   Albumin 3.3 (L) 3.5 - 5.0 g/dL   AST 55 (H) 15 - 41 U/L   ALT 45 (H) 0 - 44 U/L   Alkaline Phosphatase 92 38 - 126 U/L   Total Bilirubin 1.3 (H) 0.3 - 1.2 mg/dL   GFR, Estimated 45 (L) >60 mL/min   Anion gap 15 5 - 15  Lactic acid, plasma     Status: None   Collection Time: 08/14/22  4:24 AM  Result Value Ref Range   Lactic Acid, Venous 1.7 0.5 - 1.9 mmol/L  CBC with Differential     Status: Abnormal   Collection Time: 08/14/22  4:24 AM  Result Value Ref Range   WBC 18.2 (H) 4.0 - 10.5 K/uL   RBC 4.63 3.87 - 5.11 MIL/uL   Hemoglobin 14.1 12.0 - 15.0 g/dL   HCT 04.5 40.9 - 81.1 %   MCV 95.7 80.0 - 100.0 fL   MCH 30.5 26.0 - 34.0 pg   MCHC 31.8 30.0 - 36.0 g/dL   RDW 91.4 78.2 - 95.6 %   Platelets 213 150 - 400 K/uL   nRBC 0.0 0.0 - 0.2 %   Neutrophils Relative % 92 %   Neutro Abs 16.7 (H) 1.7 -  7.7 K/uL   Lymphocytes Relative 3 %   Lymphs Abs 0.5 (L) 0.7 - 4.0 K/uL   Monocytes Relative 4 %   Monocytes Absolute 0.7 0.1 - 1.0 K/uL   Eosinophils Relative 0 %   Eosinophils Absolute 0.1 0.0 - 0.5 K/uL   Basophils Relative 0 %  Basophils Absolute 0.1 0.0 - 0.1 K/uL   Immature Granulocytes 1 %   Abs Immature Granulocytes 0.11 (H) 0.00 - 0.07 K/uL  Protime-INR     Status: None   Collection Time: 08/14/22  4:24 AM  Result Value Ref Range   Prothrombin Time 14.6 11.4 - 15.2 seconds   INR 1.1 0.8 - 1.2  Urinalysis, w/ Reflex to Culture (Infection Suspected) -Urine, Clean Catch     Status: Abnormal   Collection Time: 08/14/22  5:30 AM  Result Value Ref Range   Specimen Source URINE, CATHETERIZED    Color, Urine AMBER (A) YELLOW   APPearance CLOUDY (A) CLEAR   Specific Gravity, Urine 1.023 1.005 - 1.030   pH 5.0 5.0 - 8.0   Glucose, UA NEGATIVE NEGATIVE mg/dL   Hgb urine dipstick MODERATE (A) NEGATIVE   Bilirubin Urine NEGATIVE NEGATIVE   Ketones, ur NEGATIVE NEGATIVE mg/dL   Protein, ur 469 (A) NEGATIVE mg/dL   Nitrite NEGATIVE NEGATIVE   Leukocytes,Ua MODERATE (A) NEGATIVE   RBC / HPF >50 0 - 5 RBC/hpf   WBC, UA >50 0 - 5 WBC/hpf   Bacteria, UA FEW (A) NONE SEEN   Squamous Epithelial / HPF 21-50 0 - 5 /HPF   Mucus PRESENT    No results found for this or any previous visit (from the past 240 hour(s)). Creatinine: Recent Labs    08/14/22 0424  CREATININE 1.35*    Impression/Assessment:  Left proximal ureteral stone, left renal stones, fever with leukocytosis and possible UTI-  Plan:  I discussed with the patient the nature, potential benefits, risks and alternatives to cystoscopy with left retrograde pyelogram and left ureteral stent placement, including side effects of the proposed treatment, the likelihood of the patient achieving the goals of the procedure, and any potential problems that might occur during the procedure or recuperation.  I discussed with her Dr. Cardell Peach  would be doing the procedure today. Also discussed temporary nature of a stent and importance of follow-up for ureteroscopy and the rationale for a staged procedure. All questions answered. Patient elects to proceed.    Jerilee Field 08/14/2022, 7:05 AM

## 2022-08-14 NOTE — Plan of Care (Signed)
  Problem: Education: Goal: Knowledge of General Education information will improve Description: Including pain rating scale, medication(s)/side effects and non-pharmacologic comfort measures Outcome: Progressing   Problem: Clinical Measurements: Goal: Ability to maintain clinical measurements within normal limits will improve Outcome: Progressing   

## 2022-08-14 NOTE — Progress Notes (Signed)
Report given to Harless Litten, CRNA.

## 2022-08-15 ENCOUNTER — Encounter (HOSPITAL_COMMUNITY): Payer: Self-pay | Admitting: Urology

## 2022-08-15 DIAGNOSIS — F10239 Alcohol dependence with withdrawal, unspecified: Secondary | ICD-10-CM | POA: Diagnosis not present

## 2022-08-15 DIAGNOSIS — N139 Obstructive and reflux uropathy, unspecified: Secondary | ICD-10-CM | POA: Diagnosis not present

## 2022-08-15 DIAGNOSIS — N1831 Chronic kidney disease, stage 3a: Secondary | ICD-10-CM | POA: Diagnosis not present

## 2022-08-15 DIAGNOSIS — I10 Essential (primary) hypertension: Secondary | ICD-10-CM | POA: Diagnosis not present

## 2022-08-15 LAB — CBC
HCT: 41.4 % (ref 36.0–46.0)
Hemoglobin: 13.2 g/dL (ref 12.0–15.0)
MCH: 30.6 pg (ref 26.0–34.0)
MCHC: 31.9 g/dL (ref 30.0–36.0)
MCV: 95.8 fL (ref 80.0–100.0)
Platelets: 194 10*3/uL (ref 150–400)
RBC: 4.32 MIL/uL (ref 3.87–5.11)
RDW: 13.6 % (ref 11.5–15.5)
WBC: 16 10*3/uL — ABNORMAL HIGH (ref 4.0–10.5)
nRBC: 0 % (ref 0.0–0.2)

## 2022-08-15 LAB — CULTURE, BLOOD (ROUTINE X 2)

## 2022-08-15 LAB — BASIC METABOLIC PANEL
Anion gap: 10 (ref 5–15)
BUN: 20 mg/dL (ref 8–23)
CO2: 21 mmol/L — ABNORMAL LOW (ref 22–32)
Calcium: 8.4 mg/dL — ABNORMAL LOW (ref 8.9–10.3)
Chloride: 102 mmol/L (ref 98–111)
Creatinine, Ser: 1.41 mg/dL — ABNORMAL HIGH (ref 0.44–1.00)
GFR, Estimated: 42 mL/min — ABNORMAL LOW (ref >60)
Glucose, Bld: 210 mg/dL — ABNORMAL HIGH (ref 70–99)
Potassium: 4 mmol/L (ref 3.5–5.1)
Sodium: 133 mmol/L — ABNORMAL LOW (ref 135–145)

## 2022-08-15 LAB — URINE CULTURE

## 2022-08-15 MED ORDER — MENTHOL 3 MG MT LOZG
1.0000 | LOZENGE | OROMUCOSAL | Status: DC | PRN
  Administered 2022-08-15 – 2022-08-16 (×2): 3 mg via ORAL
  Filled 2022-08-15 (×2): qty 9

## 2022-08-15 MED ORDER — OXYCODONE HCL 5 MG PO TABS
5.0000 mg | ORAL_TABLET | ORAL | Status: DC | PRN
Start: 2022-08-15 — End: 2022-08-15

## 2022-08-15 MED ORDER — OXYCODONE HCL 5 MG PO TABS
5.0000 mg | ORAL_TABLET | ORAL | Status: DC | PRN
  Administered 2022-08-15 – 2022-08-17 (×9): 10 mg via ORAL
  Filled 2022-08-15 (×9): qty 2

## 2022-08-15 MED ORDER — SODIUM CHLORIDE 0.9 % IV SOLN
2.0000 g | INTRAVENOUS | Status: DC
  Administered 2022-08-15 – 2022-08-16 (×2): 2 g via INTRAVENOUS
  Filled 2022-08-15 (×2): qty 20

## 2022-08-15 NOTE — Progress Notes (Signed)
Physical Therapy Treatment Patient Details Name: BRITANY WALKUP MRN: [id number] DOB: 07/10/1960 Today's Date: 08/15/2022   History of Present Illness Pt is a 62 year old female admitted on 08/14/22 with abdominal pain and fever. Past medical history significant of bladder abscess, ETOH dependence, afib, HTN, HLD,  PE, impaired glucose tolerance, and morbid obesity.    PT Comments  Pt presents with admitting diagnosis above. Pt was able to ambulate in the hallway with IV pole and navigate stairs at supervision level. Pt presents at or near baseline mobility. Pt has no further acute PT needs and will be signing off. Re consult PT if mobility status changes. Pt may benefit from a SPC upon DC.     Assistance Recommended at Discharge PRN  If plan is discharge home, recommend the following:  Can travel by private vehicle    Assist for transportation      Equipment Recommendations  Cane    Recommendations for Other Services       Precautions / Restrictions Precautions Precautions: Fall Restrictions Weight Bearing Restrictions: No     Mobility  Bed Mobility               General bed mobility comments: Pt received seated EOB    Transfers Overall transfer level: Modified independent Equipment used: None                    Ambulation/Gait Ambulation/Gait assistance: Supervision Gait Distance (Feet): 350 Feet Assistive device: IV Pole Gait Pattern/deviations: Wide base of support, Drifts right/left, Decreased stride length, Step-through pattern Gait velocity: decreased     General Gait Details: Pt with a waddling gait but overall steady with no LOB. Pt may benefit from a SPC upon DC.   Stairs Stairs: Yes Stairs assistance: Supervision Stair Management: One rail Left, Alternating pattern, Forwards, Backwards Number of Stairs: 3 General stair comments: no LOB noted. Limited by IV line   Wheelchair Mobility     Tilt Bed    Modified Rankin (Stroke  Patients Only)       Balance Overall balance assessment: Mild deficits observed, not formally tested                                          Cognition Arousal/Alertness: Awake/alert Behavior During Therapy: WFL for tasks assessed/performed Overall Cognitive Status: Within Functional Limits for tasks assessed                                          Exercises      General Comments General comments (skin integrity, edema, etc.): VSS on RA      Pertinent Vitals/Pain Pain Assessment Pain Assessment: 0-10 Pain Score: 7  Pain Location: flank pain Pain Descriptors / Indicators: Aching, Discomfort, Grimacing Pain Intervention(s): Monitored during session, Repositioned    Home Living Family/patient expects to be discharged to:: Private residence Living Arrangements: Spouse/significant other Available Help at Discharge: Family;Available PRN/intermittently Type of Home: House Home Access: Stairs to enter Entrance Stairs-Rails: Doctor, general practice of Steps: 3   Home Layout: One level Home Equipment: Shower seat      Prior Function            PT Goals (current goals can now be found in the care plan section) Acute Rehab PT  Goals PT Goal Formulation: All assessment and education complete, DC therapy    Frequency           PT Plan      Co-evaluation              AM-PAC PT "6 Clicks" Mobility   Outcome Measure  Help needed turning from your back to your side while in a flat bed without using bedrails?: None Help needed moving from lying on your back to sitting on the side of a flat bed without using bedrails?: None Help needed moving to and from a bed to a chair (including a wheelchair)?: None Help needed standing up from a chair using your arms (e.g., wheelchair or bedside chair)?: None Help needed to walk in hospital room?: A Little Help needed climbing 3-5 steps with a railing? : A Little 6 Click Score:  22    End of Session   Activity Tolerance: Patient tolerated treatment well Patient left: Other (comment) (In bathroom) Nurse Communication: Mobility status PT Visit Diagnosis: Other abnormalities of gait and mobility (R26.89)     Time: 0454-0981 PT Time Calculation (min) (ACUTE ONLY): 14 min  Charges:      PT General Charges $$ ACUTE PT VISIT: 1 Visit                     Shela Nevin, PT, DPT Acute Rehab Services [phone]    Gladys Damme 08/15/2022, 1:29 PM

## 2022-08-15 NOTE — Discharge Instructions (Signed)
Kidney Stones Nutrition Strategies  Preventing kidney stones with diet may mean changing what you eat. Depending on your risk factors and on the type of kidney stones you form, you may be advised to make changes. Nutrition therapy, as with other forms of therapy, is prescribed individually according to you and your specific needs and risks. Your registered dietitian (RD) will design and explain a plan to meet your needs. Following the nutrition recommendations prescribed to you is not always easy. Below are some strategies that may help you achieve your goals for stone prevention.  Tips Tips for Increasing Your Fluid Intake to Increase Urine Volume Do you feel like you already drink enough or that you are always going to the bathroom, yet you were still told you have low urine volume? It is helpful to know how much you drink and how much urine you produce in a typical day. This can help you understand how much you need to drink to produce the target amount of urine in a day. For a day or two, measure your total fluid intake. Use household measuring cups and drink beverages in bottles or cans with the fluid content listed on the label. Keep a log, and add up the ounces at the end of the day or within a 24-hour period. In the same day or on any other day, measure your urine output. This may be easier to do while you are at home. Keep a paper and pen in your bathroom. Use a household measuring cup to measure your urine volume every time you void. Record the amount of urine voided before discarding it. Repeat throughout the day and night. Add up the total amount of urine you voided within a 24-hour period. Use this total to confirm you are reaching the urine volume recommended. Another idea is to break your day into 3 or 4 sections with a goal for fluid intake within each section that, within a 24-hour period, gets you to your fluid intake goal. Use containers with visible fluid markings to help guide  you. Also, measure out how much your glassware holds. That will give you an idea of how much fluid you are drinking at home on a day-to-day basis.   Remember to drink more fluids to replace what is lost during sweating, as when you exercise heavily or in hot weather.  Tips for Increasing Your Potassium Intake if You Are on a Diuretic Medication Diuretics are medications that reduce urine calcium excretion. A potential unwanted side effect may be that your body "wastes" or loses potassium. While effective in reducing urine calcium, the loss of potassium from diuretics could make you low in potassium. If on a diuretic, eat at least 1 to 2 fruits and/or vegetables high in potassium daily to reduce this risk. Examples are: ? medium avocado 1 small banana 8 ounces orange juice or prune juice 8 ounces tomato juice or carrot juice (choose low salt) 1 medium baked potato 1 cup cooked cabbage  cup cooked legumes (lima beans, navy beans, pinto beans, kidney beans, split peas) While most of your potassium  will come from fruits and vegetables, especially with 5 servings or more per day, milk and yogurt are also good sources. Some fish, such as halibut and salmon, are high in potassium, too.  Tips for Eating at Least 5 Servings of Fruits and Vegetables Daily Keep fruits and vegetables ready to eat in the fridge and on the counter. Have a piece of fruit, 6 ounces of fruit  or vegetable juice, or a handful of berries at breakfast. Pack a plastic baggie full of raw vegetables for lunch, for a daytime snack at work, or when on the run. Have fruit (a piece of fruit, a cup of berries, 15-20 grapes) at lunch. Eat a big salad for lunch; load it up with raw vegetables. Drink 6 ounces of low-sodium tomato or vegetable juice every day. Always have at least 2 different kinds of vegetables at dinner; examples are peas or beans AND broccoli or cauliflower. Substitute a starchy vegetable (like squash, beans, potatoes,  peas) for your pasta or rice. Eat fruit as an evening snack; make an evening fruit smoothie with low-fat milk or yogurt.  Tips for Obtaining the Right Amount of Calcium (Usually 1,000 to 1,500 Milligrams Daily) Too little calcium is a risk factor for calcium oxalate stones because there is not enough calcium going through your digestive tract to bind with oxalate and prevent its absorption. This could lead to high urine oxalate. Too much calcium, more than your body can use, is a risk factor for calcium stones because it could cause you to excrete excessive calcium into your urine.  Striking the right balance is important. Most adults require 1,000 to 1,500 milligrams of calcium daily. Children and adolescents from 9 to 19 years need 1,300 milligrams daily. Calcium needs are easily met by foods and beverages alone. Supplements are needed only if you are unable to get enough calcium from your diet.  Even if supplements are advised, the amount of calcium from supplements will only be what is necessary to bring your total intake up to 1,000 to 1,500 milligrams daily. After reviewing your current diet, your RD can estimate whether you are meeting your calcium needs and whether a supplement may be needed.  Remember: Dairy isn't the only source of calcium! Some ready-to-consume breakfast cereals are fortified with as much as 1,000 milligrams of calcium. Here are other options:  Beverages providing between 300 and 500 milligrams of calcium per serving: 8 ounces calcium-fortified nondairy milks or fruit juices 8 ounces milk, kefir, buttermilk, or eggnog 6 to 8 ounces of some yogurts 1 cup dried figs 4 ounces firm tofu, set with calcium Foods providing between 200 and 400 milligrams of calcium per serving: 6 to 8 ounces of some yogurts or soy yogurts 1 ounce cheese Calcium-fortified foods--Read labels to know how much calcium is provided per serving 3 ounces canned sardines or canned mackerel 1 cup  cooked broccoli   Copyright 2020  Academy [employer]. All rights reserved     General, Healthful Nutrition Therapy  This handout provides you with the information you'll need to follow a general, healthful diet, which can be tailored to your personal preferences. There are several benefits to following a general, healthful diet: It could mean less calories, less salt, less added sugars, and less saturated fat than many other diets. This outcome will depend on the foods you choose. Eating more whole grains, beans, lentils, fruits, vegetables, nuts, and seeds may improve how much fiber, vitamins, and minerals you eat.  It can lower your risk for health conditions like diabetes, heart disease, hypertension, stroke, and cancer. Your registered dietitian nutritionist (RDN) may recommend portion sizes based on your individual needs and personal and cultural preferences.  Tips Every day, eat a variety of fruits and vegetables in a variety of colors. Be sure to include lots of dark green, red, blue-purple, and orange vegetables. Choose whole grains for at least  half of your grain selections. Eat more beans, peas, and lentils. Try meatless alternatives. Get protein in your diet from eggs, fish, poultry, beans, peas, lentils, and nuts/nut butters. Low-fat or fat-free dairy products are also good sources of protein. Keep your salt intake to a minimum (less than 2300 milligrams per day). Limit use of salt, soy sauce, or fish sauce when cooking. Eat freshly prepared meals at home. Processed, prepackaged, and restaurant foods contain more salt. Choose fresh fruits and vegetables for snacks. Choose products with lower sodium content when grocery shopping. Limit your daily sugar intake. Sugar may be used in sauces, marinades, dressings, and condiments - even those that do not taste sweet.  Sugar can be found in honey, syrups, jelly, fruit juice, and fruit juice concentrate. Limit  sugar-sweetened beverages like sodas and fruit juice, sugary snacks, and candy. It's best to choose products without added sugar, but if you do eat them, read labels carefully so you know how much sugar is in each portion. It is better to eat unsaturated fats than saturated fats. Use fats and oils in moderation, up to 5 servings per day. Unsaturated fat is found in fish, avocado, nuts, and oils like sunflower, canola, avocado and olive oils. Saturated fat is found in fatty meat, butter, ice cream, palm and coconut oil, cream, cheese, and lard. Many processed foods, fried foods, fast food items, convenience foods like frozen pizza and snack foods, and sweets including pies, cookies, and other pastries are high in fat. Check nutrition labels and choose these foods less often. Use vegetable oil instead of lard or butter for cooking. Boil, steam, or bake your food instead of deep frying in oil. Remove the fatty part of meats before cooking.  Foods to Choose or to Limit Choose a healthful balance of foods from each food group at your meals. Your RDN may make individualized portion size recommendations based on your needs. Food Group Foods to Choose Foods to Limit  Grains Whole wheat, barley, rye, buckwheat, corn, teff, quinoa, millet, amaranth, brown and wild rice, sorghum, and oats; focus on intact cooked whole grains Grain products, such as bread, rolls, prepared breakfast cereals, crackers, and pasta made from whole grains that are low in added sugars, saturated fat, and sodium Sweetened, low-fiber breakfast cereals Packaged (high sugar, refined ingredients) baked goods Snack crackers and chips made of refined ingredients, cheese crackers, butter crackers Breads made with refined ingredients and saturated fats, such as biscuits, frozen waffles, sweet breads, doughnuts, pastries, packaged baking mixes, pancakes, cakes, and cookies  Protein Foods Meat, including lean, trimmed cuts of beef, pork, or  lamb a few times per week or less Poultry, including skinless chicken or [place] Seafood, including fish, shrimp, lobster, clams, and scallops at least twice per week. Focus on fatty fish, such as salmon, herring, and sardines, as a rich source of omega-3 fatty acids Eggs Nuts and seeds, such as peanuts, almonds, pistachios, and sunflower seeds (unsalted varieties) Nut and seed butters, such as peanut butter, almond butter, and sunflower seed butter (reduced-sodium varieties)  Soy foods such as tofu, tempeh, or soy nuts Plant protein-based meat alternatives, such as veggie burgers and sausages (reduced-sodium varieties) Unsalted beans, lentils, or peas at least a few times per week in place of other protein sources Marbled or fatty red meats (beef, pork, lamb), such as ribs Processed red meats, such as bacon, sausage, and ham Poultry (chicken and [place]) with skin Fried meats, poultry, or fish [employer], shark, and [place] (may contain  high levels of mercury) Deli meats, such as pastrami, bologna, or salami Fried eggs Salted beans, peas, lentils, nuts, seeds, or nut/seed butters Meat alternatives with high levels of sodium or saturated fat  Dairy and Dairy Alternatives Low-fat or fat-free milk, yogurt (low in added sugars), cottage cheese, and cheeses Fortified soymilk or soy yogurt Whole milk, cream, cheeses made from whole milk, sour cream Yogurt or ice cream made from whole milk or with added sugar Cream cheese made from whole milk  Vegetables A variety of vegetables, including dark-green, red, blue-purple, and orange vegetables Low-sodium vegetable juices Canned or frozen vegetables with salt, fresh vegetables prepared with salt Fried vegetables Vegetables in cream sauce or cheese sauce Tomato or pasta sauce with high levels of salt or sugar  Fruit A variety of whole fruits, canned fruit packed in water, or dried fruit 100% fruit juice (limited to  cup per day) Fruits packed in  syrup or made with added sugar  Fats and Oils Unsaturated vegetable oils, including olive, peanut, and canola oils Vegetable oil-based margarines and spreads Salad dressing and mayonnaise made from unsaturated vegetable oils Solid shortening or partially hydrogenated oils Solid margarine made with hydrogenated or partially hydrogenated oils Butter  Beverages Coffee and tea (unsweetened) Water Sweetened drinks, including sweetened coffee or tea drinks, soda, energy drinks, and sports drinks  Other Prepared foods, including soups, casseroles, salads, baked goods, and snacks made from recommended ingredients, with low levels of added saturated fat, added sugars, or salt Sugary and/or fatty desserts, candy, and other sweets; salt and seasonings that contain salt Fried foods   General, Healthful Diet Sample 1-Day Menu View Nutrient Info Breakfast 1 cup oatmeal  cup blueberries 1 ounce almonds 1 cup 1% milk or fortified soymilk 1 cup unsweetened coffee  Lunch 2 slices whole wheat bread 3 ounces [place] slices 2 lettuce leaves 2 slices tomato 1 ounce reduced-fat, reduced sodium cheese  cup carrot sticks  cup hummus 1 banana 1 cup 1% milk or fortified soymilk 1 cup unsweetened tea  Evening Meal 4 ounces salmon, baked  cup cooked brown rice 1 cup green beans, cooked 1 cup mixed greens salad 1 teaspoon olive oil mixed with vinegar of choice 1 whole wheat dinner roll 1 teaspoon margarine, soft, tub (for roll) 1 cup water  Evening Snack 1 cup low-fat yogurt  cup sliced peaches

## 2022-08-15 NOTE — Evaluation (Signed)
Occupational Therapy Evaluation Patient Details Name: [name] MRN: [id number] DOB: 1960-11-20 Today's Date: 08/15/2022   History of Present Illness Pt is a 62 year old female admitted on 08/14/22 with abdominal pain and fever. Past medical history significant of bladder abscess, ETOH dependence, afib, HTN, HLD,  PE, impaired glucose tolerance, and morbid obesity.   Clinical Impression   Pt is s/p above diagnosis. Pt doing well, motivated to participate and return home. Pt lives at home with husband, mod I for ADLs, supervision for mobility, some help with LB dressing at baseline, appears to be close to baseline function. Pt requesting and would benefit from shower seat and SPC to improve safety and mobility at home and with ADLs. Pt educated on use of sock aide/reacher, able to perform with set up, good carryover of learned techniques. Pt does not need continued OT or  follow up, has support at home needed to remain safe and at [facility].     Recommendations for follow up therapy are one component of a multi-disciplinary discharge planning process, led by the attending physician.  Recommendations may be updated based on patient status, additional functional criteria and insurance authorization.   Assistance Recommended at Discharge PRN  Patient can return home with the following A little help with bathing/dressing/bathroom;Assist for transportation;Help with stairs or ramp for entrance    Functional Status Assessment  Patient has had a recent decline in their functional status and demonstrates the ability to make significant improvements in function in a reasonable and predictable amount of time.  Equipment Recommendations  Tub/shower seat;Other (comment) [facility])    Recommendations for Other Services       Precautions / Restrictions Precautions Precautions: Fall Restrictions Weight Bearing Restrictions: No      Mobility Bed Mobility               General bed mobility comments:  arrived with Pt sitting EOB    Transfers Overall transfer level: Modified independent Equipment used: None                      Balance Overall balance assessment: Modified Independent                                         ADL either performed or assessed with clinical judgement   ADL Overall ADL's : At baseline                                       General ADL Comments: Pt at baseline needs assistance with LB dressing, mod I for other ADLs, supervision for mobility     Vision         Perception     Praxis      Pertinent Vitals/Pain Pain Assessment Pain Assessment: 0-10 Pain Score: 7  Pain Location: urethral pain with urination only, 0 at rest Pain Descriptors / Indicators: Aching, Sharp Pain Intervention(s): Monitored during session     Hand Dominance Right   Extremity/Trunk Assessment Upper Extremity Assessment Upper Extremity Assessment: RUE deficits/detail RUE Deficits / Details: hx of RTC tears R shoulder, limited overhead reach RUE: Shoulder pain with ROM RUE Sensation: WNL RUE Coordination: decreased gross motor   Lower Extremity Assessment Lower Extremity Assessment: Defer to PT evaluation   Cervical / Trunk Assessment Cervical /  Trunk Assessment: Normal   Communication Communication Communication: No difficulties   Cognition Arousal/Alertness: Awake/alert Behavior During Therapy: WFL for tasks assessed/performed Overall Cognitive Status: Within Functional Limits for tasks assessed                                       General Comments       Exercises     Shoulder Instructions      Home Living Family/patient expects to be discharged to:: Private residence Living Arrangements: Spouse/significant other Available Help at Discharge: Family;Available PRN/intermittently Type of Home: House Home Access: Stairs to enter [employer] of Steps: 3 Entrance Stairs-Rails:  Right;Left Home Layout: One level     Bathroom Shower/Tub: Chief Strategy Officer: Standard                Prior Functioning/Environment Prior Level of Function : Independent/Modified Independent             Mobility Comments: Ind no AD ADLs Comments: Ind        OT Problem List: Decreased range of motion;Decreased activity tolerance;Pain;Impaired UE functional use      OT Treatment/Interventions:      OT Goals(Current goals can be found in the care plan section) Acute Rehab OT Goals Patient Stated Goal: to return home OT Goal Formulation: With patient/family Time For Goal Achievement: 08/29/22 Potential to Achieve Goals: Good  OT Frequency:      Co-evaluation              AM-PAC OT "6 Clicks" Daily Activity     Outcome Measure Help from another person eating meals?: None Help from another person taking care of personal grooming?: None Help from another person toileting, which includes using toliet, bedpan, or urinal?: None Help from another person bathing (including washing, rinsing, drying)?: A Little Help from another person to put on and taking off regular upper body clothing?: None Help from another person to put on and taking off regular lower body clothing?: A Lot 6 Click Score: 21   End of Session Nurse Communication: Mobility status  Activity Tolerance: Patient tolerated treatment well Patient left: in bed;with call bell/phone within reach;with family/visitor present  OT Visit Diagnosis: Unsteadiness on feet (R26.81);Pain                Time: [id number] OT Time Calculation (min): 30 min Charges:  OT General Charges $OT Visit: 1 Visit OT Evaluation $OT Eval Low Complexity: 1 Low  [address] OTR/L   Alexis Goodell 08/15/2022, 3:44 PM

## 2022-08-15 NOTE — Progress Notes (Signed)
Nutrition Brief Note  Consult received for nutrition goals.   Pt admitted with c/o abdominal pain and fever, found to have acute unilateral obstructive uropathy. Now s/p cystoscopy with retrograde pyelogram, ureteroscopy and L ureteral stent placement.  Spoke with pt at bedside. With the exception of the last couple days d/t fever and pain, she has a great appetite and eats 3-4 meals per day with snacks daily while she is working. Snacks include chips, cookies and a banana.   Informed pt, RD would add handouts to AVS for home reference regarding "General, Healthful Nutrition Therapy" and "[facility] Nutrition Strategies."  Wt Readings from Last 15 Encounters:  08/14/22 133.4 kg  05/24/22 127 kg  03/24/22 133.4 kg  11/13/21 131.8 kg  11/06/21 130.2 kg  09/30/21 126.6 kg  09/25/21 126.6 kg  09/09/21 (!) 140.2 kg  04/17/21 135.4 kg  03/31/21 129.3 kg  03/13/21 133.4 kg  11/14/20 127 kg  11/02/20 127 kg  09/03/20 132.9 kg  07/20/20 132.9 kg    Body mass index is 55.55 kg/m. Patient meets criteria for morbid obesity based on current BMI.   Current diet order is Heart Healthy, no documented meal completions on file to review. Labs and medications reviewed.   No additional nutrition interventions warranted at this time. If nutrition issues arise, please consult RD.   Drusilla Kanner, RDN, LDN Clinical Nutrition

## 2022-08-15 NOTE — Progress Notes (Addendum)
[facility]                                                                               Stephenie Larabee, is a 62 y.o. female, DOB - 23-Dec-1960, ZOX:[id number] Admit date - 08/14/2022    Outpatient Primary MD for the patient is Corwin Levins, MD  LOS - 1  days    Brief summary   62 y.o. female with medical history significant of bladder abscess, ETOH dependence, afib, HTN, HLD,  PE, impaired glucose tolerance, and morbid obesity presenting with abdominal pain, fever.  She reports that she started with high fevers up to 104 up to 5 days ago, with LLL abdominal pain.    Assessment & Plan    Assessment and Plan:  Obstructive Uropathy with UTI:  S/p left ureteral stent placement with Dr. Cardell Peach on 7/4  Continue with IV antibiotics and pain control.  Foley removed today. Improving leukocytosis.  Await culture report.  Repeat cbc and bmp in am.    E coli bacteremia:  Currently on IV rocephin.   Stage 3a CKD:  Creatinine appears to be stable today.  Holding Hyzaar until discharge.    ETOH Dependence:  On CIWA.    Hypertension:  BP parameters are optimal on metoprolol.     Hypokalemia Replaced.    Elevated liver enzymes suspect from alcoholic hepatitis.  Recheck in am.    Asthma:  No wheezing heard.    Body mass index is 55.55 kg/m. Morbid Obesity;  Recommend outpatient PCP/ Bariatric surgery.    H/o PE/ PAF:  Rate controlled with metoprolol.  On eliquis.     Estimated body mass index is 55.55 kg/m as calculated from the following:   Height as of this encounter: 5\' 1"  (1.549 m).   Weight as of this encounter: 133.4 kg.  Code Status: full code.  DVT Prophylaxis:  SCDs Start: 08/14/22 0917 apixaban (ELIQUIS) tablet 5 mg   Level of Care: Level of care: Telemetry Medical Family Communication: none at bedside.   Disposition Plan:     Remains inpatient appropriate:  IV antibiotics  Procedures:  S/p left ureteral stent placement with Dr.  Cardell Peach on 7/4   Consultants:   Urology.   Antimicrobials:   Anti-infectives (From admission, onward)    Start     Dose/Rate Route Frequency Ordered Stop   08/15/22 1800  cefTRIAXone (ROCEPHIN) 2 g in sodium chloride 0.9 % 100 mL IVPB  Status:  Discontinued        2 g 200 mL/hr over 30 Minutes Intravenous Every 24 hours 08/14/22 1758 08/15/22 1125   08/15/22 1300  cefTRIAXone (ROCEPHIN) 2 g in sodium chloride 0.9 % 100 mL IVPB        2 g 200 mL/hr over 30 Minutes Intravenous Every 24 hours 08/15/22 1125     08/15/22 0800  cefTRIAXone (ROCEPHIN) 1 g in sodium chloride 0.9 % 100 mL IVPB  Status:  Discontinued        1 g 200 mL/hr over 30 Minutes Intravenous Every 24 hours 08/14/22 0746 08/14/22 1758   08/14/22 1845  cefTRIAXone (ROCEPHIN) 1 g in sodium chloride 0.9 % 100 mL IVPB  1 g 200 mL/hr over 30 Minutes Intravenous  Once 08/14/22 1758 08/14/22 1848   08/14/22 0630  cefTRIAXone (ROCEPHIN) 1 g in sodium chloride 0.9 % 100 mL IVPB        1 g 200 mL/hr over 30 Minutes Intravenous  Once 08/14/22 9604 08/14/22 5409        Medications  Scheduled Meds:  apixaban  5 mg Oral BID   atorvastatin  20 mg Oral Daily   Chlorhexidine Gluconate Cloth  6 each Topical Q0600   docusate sodium  100 mg Oral BID   famotidine  20 mg Oral BID   folic acid  1 mg Oral Daily   LORazepam  0-4 mg Intravenous Q6H   Followed by   Melene Muller ON 08/16/2022] LORazepam  0-4 mg Intravenous Q12H   metoprolol succinate  25 mg Oral Daily   mometasone-formoterol  2 puff Inhalation BID   multivitamin with minerals  1 tablet Oral Daily   mupirocin ointment  1 Application Nasal BID   sodium chloride flush  3 mL Intravenous Q12H   thiamine  100 mg Oral Daily   Or   thiamine  100 mg Intravenous Daily   Continuous Infusions:  cefTRIAXone (ROCEPHIN)  IV 2 g (08/15/22 1245)   PRN Meds:.acetaminophen **OR** acetaminophen, albuterol, bisacodyl, hydrALAZINE, LORazepam **OR** LORazepam, LORazepam, ondansetron **OR**  ondansetron (ZOFRAN) IV, mouth rinse, oxyCODONE, polyethylene glycol    Subjective:   Charneice Weedon was seen and examined today.  Wants to get the telemetry off. Requesting pain meds.   Objective:   Vitals:   08/15/22 0537 08/15/22 0801 08/15/22 0832 08/15/22 0839  BP: 120/66   120/71  Pulse: 99 90  91  Resp:    16  Temp:    97.9 F (36.6 C)  TempSrc:    Oral  SpO2: (!) 85% 98% 96% 96%  Weight:      Height:        Intake/Output Summary (Last 24 hours) at 08/15/2022 1616 Last data filed at 08/15/2022 1100 Gross per 24 hour  Intake 859.65 ml  Output 300 ml  Net 559.65 ml   Filed Weights   08/14/22 0437  Weight: 133.4 kg     Exam General exam: Appears calm and comfortable  Respiratory system: Clear to auscultation. Respiratory effort normal. Cardiovascular system: S1 & S2 heard, RRR. No JVD, Gastrointestinal system: Abdomen is nondistended, soft and nontender.  Central nervous system: Alert and oriented. No focal neurological deficits. Extremities: Symmetric 5 x 5 power. Skin: No rashes, Psychiatry: Mood & affect appropriate.     Data Reviewed:  I have personally reviewed following labs and imaging studies   CBC Lab Results  Component Value Date   WBC 16.0 (H) 08/15/2022   RBC 4.32 08/15/2022   HGB 13.2 08/15/2022   HCT 41.4 08/15/2022   MCV 95.8 08/15/2022   MCH 30.6 08/15/2022   PLT 194 08/15/2022   MCHC 31.9 08/15/2022   RDW 13.6 08/15/2022   LYMPHSABS 0.5 (L) 08/14/2022   MONOABS 0.7 08/14/2022   EOSABS 0.1 08/14/2022   BASOSABS 0.1 08/14/2022     Last metabolic panel Lab Results  Component Value Date   NA 133 (L) 08/15/2022   K 4.0 08/15/2022   CL 102 08/15/2022   CO2 21 (L) 08/15/2022   BUN 20 08/15/2022   CREATININE 1.41 (H) 08/15/2022   GLUCOSE 210 (H) 08/15/2022   GFRNONAA 42 (L) 08/15/2022   GFRAA >60 09/29/2019   CALCIUM 8.4 (L) 08/15/2022  PHOS 3.3 05/25/2022   PROT 7.3 08/14/2022   ALBUMIN 3.3 (L) 08/14/2022   BILITOT 1.3  (H) 08/14/2022   ALKPHOS 92 08/14/2022   AST 55 (H) 08/14/2022   ALT 45 (H) 08/14/2022   ANIONGAP 10 08/15/2022    CBG (last 3)  No results for input(s): "GLUCAP" in the last 72 hours.    Coagulation Profile: Recent Labs  Lab 08/14/22 0424  INR 1.1     Radiology Studies: DG Cystogram  Result Date: 08/15/2022 CLINICAL DATA:  Left ureteral stent placement EXAM: INTRAOPERATIVE LEFT RETROGRADE UROGRAPHY TECHNIQUE: Images were obtained with the C-arm fluoroscopic device intraoperatively and submitted for interpretation post-operatively. Please see the procedural report for the amount of contrast and the fluoroscopy time utilized. COMPARISON:  CT from 08/14/2022 FINDINGS: Two images obtained via portable C-arm radiography in the operating room were submitted. Images show placement of a left-sided nephroureteral stent. Contrast opacification of the left renal collecting system and proximal ureter identified. Filling defect within the dilated proximal left renal collecting system corresponds to known urinary tract calculi. IMPRESSION: Intraoperative left retrograde urography with stent placement. Electronically Signed   By: Signa Kell M.D.   On: 08/15/2022 08:04   CT ABDOMEN PELVIS W CONTRAST  Result Date: 08/14/2022 CLINICAL DATA:  Left lower quadrant pain EXAM: CT ABDOMEN AND PELVIS WITH CONTRAST TECHNIQUE: Multidetector CT imaging of the abdomen and pelvis was performed using the standard protocol following bolus administration of intravenous contrast. RADIATION DOSE REDUCTION: This exam was performed according to the departmental dose-optimization program which includes automated exposure control, adjustment of the mA and/or kV according to patient size and/or use of iterative reconstruction technique. CONTRAST:  60mL OMNIPAQUE IOHEXOL 350 MG/ML SOLN COMPARISON:  09/05/2021 FINDINGS: Lower chest:  Bands of scarring in the subpleural right lower lobe. Hepatobiliary: Marked hepatic steatosis.  Multiple relatively higher density nodules in the liver from known hemangiomas, numbering at least 4 and measuring up to 3.5 cm in the right lobe liver.No evidence of biliary obstruction or stone. Pancreas: Unremarkable. Spleen: Unremarkable. Adrenals/Urinary Tract: Negative adrenals. Left hydronephrosis and delayed renal excretion affecting the left kidney due to a proximal ureteric stone that measures 7 mm. Multiple calculi at the lower pole left kidney with conglomerate measuring up to 7 mm at the lower pole. Collapsed urinary bladder which contains gas and is indistinguishable from a small bowel loop in the pelvis high implantation of the left ureter at the bladder dome. Stomach/Bowel: No bowel obstruction or visible inflammation. Multiple colonic diverticula. Vascular/Lymphatic: Atheromatous calcification. No mass or adenopathy. Reproductive:Hysterectomy Other: No ascites or pneumoperitoneum. Musculoskeletal: Confluent spondylosis and ankylosis affecting the lower thoracic spine. Severe lumbar disc and facet degeneration. IMPRESSION: 1. Obstructing 7 mm stone in the proximal left ureter. Multiple left renal calculi. 2. Gas in the collapsed urinary bladder which is indistinguishable from pelvic small bowel loops, question persisting fistula. 3. Marked hepatic steatosis with hemangiomas. Electronically Signed   By: Tiburcio Pea M.D.   On: 08/14/2022 06:14   DG Chest Portable 1 View  Result Date: 08/14/2022 CLINICAL DATA:  Shortness of breath.  Cough and chest pain EXAM: PORTABLE CHEST 1 VIEW COMPARISON:  03/24/2022 FINDINGS: Cardiomegaly and vascular pedicle widening. Low volume chest with vascular congestion. The left diaphragm is chronically obscured due to enlarged heart and mediastinal fat by most recent chest CT. No pneumothorax or convincing effusion. Artifact from EKG leads. IMPRESSION: Cardiomegaly and vascular congestion with low lung volumes. Electronically Signed   By: Audry Riles.D.  On:  08/14/2022 05:06       Kathlen Mody M.D. [facility] 08/15/2022, 4:16 PM  Available via Epic secure chat 7am-7pm After 7 pm, please refer to night coverage provider listed on amion.

## 2022-08-15 NOTE — Progress Notes (Addendum)
1 Day Post-Op Subjective: 7/5: NAEON. Pt sitting on edge of bed starting breakfast on my arrival. Still having some flank pain.  Objective: Vital signs in last 24 hours: Temp:  [97.5 F (36.4 C)-99.9 F (37.7 C)] 97.9 F (36.6 C) (07/05 0839) Pulse Rate:  [90-139] 91 (07/05 0839) Resp:  [16-28] 16 (07/05 0839) BP: (120-139)/(66-84) 120/71 (07/05 0839) SpO2:  [85 %-100 %] 96 % (07/05 0839)  Intake/Output from previous day: 07/04 0701 - 07/05 0700 In: 1859.7 [I.V.:859.7; IV Piggyback:1000] Out: 200 [Urine:200]  Intake/Output this shift: No intake/output data recorded.  Physical Exam:  General: Alert and oriented CV: No cyanosis Lungs: equal chest rise Abdomen: Soft, NTND, no rebound or guarding Gu: foley cath draining clear yellow urine.   Lab Results: Recent Labs    08/14/22 0424 08/15/22 0224  HGB 14.1 13.2  HCT 44.3 41.4   BMET Recent Labs    08/14/22 0424 08/15/22 0224  NA 137 133*  K 3.4* 4.0  CL 98 102  CO2 24 21*  GLUCOSE 142* 210*  BUN 16 20  CREATININE 1.35* 1.41*  CALCIUM 9.2 8.4*     Studies/Results: DG Cystogram  Result Date: 08/15/2022 CLINICAL DATA:  Left ureteral stent placement EXAM: INTRAOPERATIVE LEFT RETROGRADE UROGRAPHY TECHNIQUE: Images were obtained with the C-arm fluoroscopic device intraoperatively and submitted for interpretation post-operatively. Please see the procedural report for the amount of contrast and the fluoroscopy time utilized. COMPARISON:  CT from 08/14/2022 FINDINGS: Two images obtained via portable C-arm radiography in the operating room were submitted. Images show placement of a left-sided nephroureteral stent. Contrast opacification of the left renal collecting system and proximal ureter identified. Filling defect within the dilated proximal left renal collecting system corresponds to known urinary tract calculi. IMPRESSION: Intraoperative left retrograde urography with stent placement. Electronically Signed   By:  Signa Kell M.D.   On: 08/15/2022 08:04   CT ABDOMEN PELVIS W CONTRAST  Result Date: 08/14/2022 CLINICAL DATA:  Left lower quadrant pain EXAM: CT ABDOMEN AND PELVIS WITH CONTRAST TECHNIQUE: Multidetector CT imaging of the abdomen and pelvis was performed using the standard protocol following bolus administration of intravenous contrast. RADIATION DOSE REDUCTION: This exam was performed according to the departmental dose-optimization program which includes automated exposure control, adjustment of the mA and/or kV according to patient size and/or use of iterative reconstruction technique. CONTRAST:  60mL OMNIPAQUE IOHEXOL 350 MG/ML SOLN COMPARISON:  09/05/2021 FINDINGS: Lower chest:  Bands of scarring in the subpleural right lower lobe. Hepatobiliary: Marked hepatic steatosis. Multiple relatively higher density nodules in the liver from known hemangiomas, numbering at least 4 and measuring up to 3.5 cm in the right lobe liver.No evidence of biliary obstruction or stone. Pancreas: Unremarkable. Spleen: Unremarkable. Adrenals/Urinary Tract: Negative adrenals. Left hydronephrosis and delayed renal excretion affecting the left kidney due to a proximal ureteric stone that measures 7 mm. Multiple calculi at the lower pole left kidney with conglomerate measuring up to 7 mm at the lower pole. Collapsed urinary bladder which contains gas and is indistinguishable from a small bowel loop in the pelvis high implantation of the left ureter at the bladder dome. Stomach/Bowel: No bowel obstruction or visible inflammation. Multiple colonic diverticula. Vascular/Lymphatic: Atheromatous calcification. No mass or adenopathy. Reproductive:Hysterectomy Other: No ascites or pneumoperitoneum. Musculoskeletal: Confluent spondylosis and ankylosis affecting the lower thoracic spine. Severe lumbar disc and facet degeneration. IMPRESSION: 1. Obstructing 7 mm stone in the proximal left ureter. Multiple left renal calculi. 2. Gas in the  collapsed urinary bladder  which is indistinguishable from pelvic small bowel loops, question persisting fistula. 3. Marked hepatic steatosis with hemangiomas. Electronically Signed   By: Tiburcio Pea M.D.   On: 08/14/2022 06:14   DG Chest Portable 1 View  Result Date: 08/14/2022 CLINICAL DATA:  Shortness of breath.  Cough and chest pain EXAM: PORTABLE CHEST 1 VIEW COMPARISON:  03/24/2022 FINDINGS: Cardiomegaly and vascular pedicle widening. Low volume chest with vascular congestion. The left diaphragm is chronically obscured due to enlarged heart and mediastinal fat by most recent chest CT. No pneumothorax or convincing effusion. Artifact from EKG leads. IMPRESSION: Cardiomegaly and vascular congestion with low lung volumes. Electronically Signed   By: Tiburcio Pea M.D.   On: 08/14/2022 05:06    Assessment/Plan: #Ureteral Stone #AKI S/p left ureteral stent placement with Dr. Cardell Peach on 7/4.  Will need URS scheduled for definitive stone management on an outpatient basis. Urinalysis relatively unremarkable and nitrite negative.  Few bacteria appreciated.  Somewhat dehydrated on arrival.   Agree with empiric antibiotics based on patient's symptoms and requirement for instrumentation.  Tailor to culture data.  Interval improvement in leukocytosis Foley catheter can be removed at hospitalist discretion after patient has been normothermic for 24 hours. Trend daily labs.  I expect kidney injury to improve over the next 48 hours. We'll continue to follow along   LOS: 1 day   Elmon Kirschner, NP [facility] Pager: [phone]  08/15/2022, 11:33 AM    I have seen and examined the patient and agree with the above assessment and plan.  F/u urine cx. Will require culture specific abx for 10 days. I have arranged f/u in office to arrange for definitive treatment of stones.  Matt R. Hulon Ferron MD [facility]  Pager: [phone]

## 2022-08-16 DIAGNOSIS — N139 Obstructive and reflux uropathy, unspecified: Secondary | ICD-10-CM | POA: Diagnosis not present

## 2022-08-16 LAB — HEPATIC FUNCTION PANEL
ALT: 44 U/L (ref 0–44)
AST: 48 U/L — ABNORMAL HIGH (ref 15–41)
Albumin: 3 g/dL — ABNORMAL LOW (ref 3.5–5.0)
Alkaline Phosphatase: 77 U/L (ref 38–126)
Bilirubin, Direct: <0.1 mg/dL (ref 0.0–0.2)
Total Bilirubin: 0.4 mg/dL (ref 0.3–1.2)
Total Protein: 7 g/dL (ref 6.5–8.1)

## 2022-08-16 LAB — CBC WITH DIFFERENTIAL/PLATELET
Abs Immature Granulocytes: 0.07 10*3/uL (ref 0.00–0.07)
Basophils Absolute: 0 10*3/uL (ref 0.0–0.1)
Basophils Relative: 0 %
Eosinophils Absolute: 0.2 10*3/uL (ref 0.0–0.5)
Eosinophils Relative: 2 %
HCT: 41.8 % (ref 36.0–46.0)
Hemoglobin: 13 g/dL (ref 12.0–15.0)
Immature Granulocytes: 1 %
Lymphocytes Relative: 12 %
Lymphs Abs: 1.5 10*3/uL (ref 0.7–4.0)
MCH: 29.9 pg (ref 26.0–34.0)
MCHC: 31.1 g/dL (ref 30.0–36.0)
MCV: 96.1 fL (ref 80.0–100.0)
Monocytes Absolute: 1.3 10*3/uL — ABNORMAL HIGH (ref 0.1–1.0)
Monocytes Relative: 10 %
Neutro Abs: 9.9 10*3/uL — ABNORMAL HIGH (ref 1.7–7.7)
Neutrophils Relative %: 75 %
Platelets: 213 10*3/uL (ref 150–400)
RBC: 4.35 MIL/uL (ref 3.87–5.11)
RDW: 13.4 % (ref 11.5–15.5)
WBC: 13.1 10*3/uL — ABNORMAL HIGH (ref 4.0–10.5)
nRBC: 0 % (ref 0.0–0.2)

## 2022-08-16 LAB — BASIC METABOLIC PANEL
Anion gap: 9 (ref 5–15)
BUN: 25 mg/dL — ABNORMAL HIGH (ref 8–23)
CO2: 26 mmol/L (ref 22–32)
Calcium: 8.8 mg/dL — ABNORMAL LOW (ref 8.9–10.3)
Chloride: 103 mmol/L (ref 98–111)
Creatinine, Ser: 1.65 mg/dL — ABNORMAL HIGH (ref 0.44–1.00)
GFR, Estimated: 35 mL/min — ABNORMAL LOW (ref >60)
Glucose, Bld: 122 mg/dL — ABNORMAL HIGH (ref 70–99)
Potassium: 3.6 mmol/L (ref 3.5–5.1)
Sodium: 138 mmol/L (ref 135–145)

## 2022-08-16 LAB — CULTURE, BLOOD (ROUTINE X 2)

## 2022-08-16 LAB — URINE CULTURE: Culture: >=100000 — AB

## 2022-08-16 MED ORDER — SODIUM CHLORIDE 0.9 % IV SOLN: INTRAVENOUS | Status: DC

## 2022-08-16 MED ORDER — ACETAMINOPHEN 500 MG PO TABS: 500.0000 mg | ORAL_TABLET | Freq: Four times a day (QID) | ORAL | Status: DC | PRN

## 2022-08-16 MED ORDER — SODIUM CHLORIDE 0.9 % IV BOLUS
250.0000 mL | Freq: Once | INTRAVENOUS | Status: AC
  Administered 2022-08-16: 250 mL via INTRAVENOUS

## 2022-08-16 NOTE — TOC Initial Note (Signed)
Transition of Care [facility]) - Initial/Assessment Note    Patient Details  Name: [name] MRN: [id number] Date of Birth: 03-25-1960  Transition of Care [facility]) CM/SW Contact:    Ronny Bacon, RN Phone Number: 08/16/2022, 3:21 PM  Clinical Narrative:  Spoke with patient by phone. Patient lives at home with spouse who will pick patient up at discharge. PT recommendations for cane, patient requested quad cane due to weight. Quad cane arranged through [employer] to be delivered to bedside.                Expected Discharge Plan: Home/Self Care Barriers to Discharge: Continued Medical Work up   Patient Goals and CMS Choice Patient states their goals for this hospitalization and ongoing recovery are:: to go home          Expected Discharge Plan and Services   Discharge Planning Services: CM Consult   Living arrangements for the past 2 months: Single Family Home                 DME Arranged: [facility] Isla Pence) DME Agency: [employer] Date DME Agency Contacted: 08/16/22 Time DME Agency Contacted: [phone] Representative spoke with at DME Agency: Leavy Cella            Prior Living Arrangements/Services Living arrangements for the past 2 months: Single Family Home Lives with:: Spouse Patient language and need for interpreter reviewed:: Yes Do you feel safe going back to the place where you live?: Yes      Need for Family Participation in Patient Care: No (Comment) Care giver support system in place?: Yes (comment)   Criminal Activity/Legal Involvement Pertinent to Current Situation/Hospitalization: No - Comment as needed  Activities of Daily Living      Permission Sought/Granted Permission sought to share information with : Case Manager, Magazine features editor Permission granted to share information with : Yes, Verbal Permission Granted     Permission granted to share info w AGENCY: [employer]        Emotional Assessment   Attitude/Demeanor/Rapport:  Engaged Affect (typically observed): Appropriate Orientation: : Oriented to Self, Oriented to Place, Oriented to  Time, Oriented to Situation Alcohol / Substance Use: Not Applicable Psych Involvement: No (comment)  Admission diagnosis:  Kidney stone [N20.0] Acute unilateral obstructive uropathy [N13.9] Sepsis, due to unspecified organism, unspecified whether acute organ dysfunction present [facility]) [A41.9] Patient Active Problem List   Diagnosis Date Noted   Acute unilateral obstructive uropathy 08/14/2022   Chronic kidney disease, stage 3a (HCC) 08/14/2022   Alcohol withdrawal with perceptual disturbances (HCC) 05/24/2022   Nausea & vomiting 05/24/2022   Abdominal pain 05/24/2022   History of asthma 05/24/2022   Elevated AST (SGOT) 05/24/2022   Prediabetes 05/24/2022   Asthma, moderate persistent, poorly-controlled 11/13/2021   Cough 11/06/2021   Wheezing 11/06/2021   Chronic anticoagulation 11/06/2021   E coli bacteremia 09/06/2021   AKI (acute kidney injury) (HCC) 09/06/2021   Right ureteral stone 09/06/2021   Hydronephrosis of right kidney 09/06/2021   Acute pyelonephritis 09/06/2021   Alcohol dependence with withdrawal delirium (HCC) 09/02/2021   Vitamin D deficiency 03/13/2021   Microhematuria 03/13/2021   Tear of distal tendon of biceps 11/16/2020   Positive blood cultures    Asthma exacerbation 07/22/2020   Hyperkalemia 07/22/2020   PAF (paroxysmal atrial fibrillation) (HCC) 04/01/2020   Atrial flutter with rapid ventricular response (HCC) 04/01/2020   Alcohol dependence with withdrawal (HCC) 12/19/2019   History of pulmonary embolism 12/19/2019   Pneumonia due to  COVID-19 virus 02/10/2019   Pulmonary embolism (HCC) 07/06/2018   Leukocytosis 09/22/2017   Rash 09/22/2017   Sepsis (HCC) 09/12/2016   Colovesical fistula 09/12/2016   Alcohol abuse 07/28/2016   Colonic diverticulum 07/28/2016   Hyperlipidemia 07/10/2016   Mass of left side of neck 07/10/2016   Head  and neck lymphadenopathy 07/10/2016   Eustachian tube disorder 01/25/2016   Peripheral edema 03/16/2015   Asthma 03/16/2015   Right shoulder pain 03/16/2015   Hypokalemia 10/04/2014   Upper airway cough syndrome 10/03/2014   Morbid obesity with BMI of 50.0-59.9, adult (HCC) 10/03/2014   Lower back pain 05/25/2014   Bilateral shoulder pain 05/25/2014   Allergic rhinitis 12/06/2013   Impaired glucose tolerance 12/06/2013   Expected blood loss anemia 12/16/2012   Morbid obesity (HCC) 12/15/2012   S/P left TKA 12/14/2012   Goiter 11/26/2012   Preop exam for internal medicine 11/26/2012   Vaginal bleeding 04/30/2011   Colon polyps 02/10/2011   Eczema 02/10/2011   Depression 02/10/2011   Encounter for well adult exam with abnormal findings 09/27/2010   Anxiety state 04/05/2010   DIVERTICULITIS, HX OF 04/05/2010   PALPITATIONS, HX OF 09/14/2007   Essential hypertension 04/24/2007   VOCAL CORD DISORDER 04/24/2007   Extrinsic asthma 04/24/2007   GERD 04/24/2007   PCP:  Corwin Levins, MD Pharmacy:   [facility] [phone] - [place], [place] - [address] AT [facility] [address] [place] [address] Phone: [phone] Fax: [phone]     Social Determinants of Health (SDOH) Social History: SDOH Screenings   Food Insecurity: Food Insecurity Present (09/12/2021)  Housing: Medium Risk (11/07/2021)  Transportation Needs: No Transportation Needs (09/12/2021)  Utilities: Not At Risk (11/11/2021)  Recent Concern: Utilities - At Risk (11/07/2021)  Depression (PHQ2-9): Low Risk  (03/24/2022)  Financial Resource Strain: Medium Risk (11/07/2021)  Tobacco Use: Medium Risk (08/15/2022)   SDOH Interventions:     Readmission Risk Interventions     No data to display

## 2022-08-16 NOTE — TOC CM/SW Note (Signed)
    Durable Medical Equipment  (From admission, onward)           Start     Ordered   08/16/22 1515  For home use only DME Cane  Once       Comments: Quad cane   08/16/22 1514

## 2022-08-16 NOTE — Plan of Care (Signed)
  Problem: Clinical Measurements: Goal: Ability to maintain clinical measurements within normal limits will improve Outcome: Progressing   

## 2022-08-16 NOTE — Progress Notes (Signed)
[name]  BJY:[id number] DOB: [birth year] DOA: 08/14/2022 PCP: Corwin Levins, MD    Brief Narrative:  61 year old with a history of prior bladder abscess/fistula related to diverticular abscess 2018, nephrolithiasis, alcohol dependence, chronic atrial fibrillation, HTN, HLD, prior PE, morbid obesity, and impaired glucose tolerance who presented to the ER with the acute onset of abdominal pain and fever up to 104 which persisted for 4-5 days.  CT abdomen pelvis in the ER revealed a 7 mm left proximal ureteral stone with hydronephrosis as well as stones within the left lower pole of the kidney.  Goals of Care:  Code Status: Full Code   DVT prophylaxis: Eliquis  Interim Hx: Afebrile.  Vital signs stable.  Reports poor appetite.  Denies significant uncontrolled pain.  No shortness of breath.  Is worried about her renal function.  Assessment & Plan:  Obstructing left ureteral stone - complicated E. coli UTI Status post left ureteral stent placement with Dr. Cardell Peach 08/14/2022 -will need URS scheduled for definitive stone management as an outpatient - plan is to treat with directed antibiotic therapy for 10 days - urine and blood cultures confirming E. coli  Pan sensitive E. coli bacteremia Due to above -will treat with extended course of antibiotics -clinically stabilizing  Acute kidney failure on stage IIIa CKD Baseline creatinine on review of records appears to be 1.2-1.35 -continue to support with hydration and follow renal function  Alcohol dependence No evidence of withdrawal at present  HTN Blood pressure well-controlled  Hypokalemia Corrected with supplementation  Transaminitis Likely related to chronic alcohol abuse with typical pattern appreciated  Morbid obesity - Body mass index is 55.55 kg/m.  History of PE Continue Eliquis  History of paroxysmal atrial fibrillation Rate well-controlled on metoprolol -continue Eliquis  Family Communication: No family present at  time of exam Disposition: Anticipate discharge home, hopefully 7/7 if renal function improving   Objective: Blood pressure 135/73, pulse 100, temperature 97.6 F (36.4 C), temperature source Oral, resp. rate 18, height 5\' 1"  (1.549 m), weight 133.4 kg, SpO2 93 %.  Intake/Output Summary (Last 24 hours) at 08/16/2022 0845 Last data filed at 08/16/2022 0819 Gross per 24 hour  Intake 100 ml  Output 930 ml  Net -830 ml   Filed Weights   08/14/22 0437  Weight: 133.4 kg    Examination: General: No acute respiratory distress Lungs: Clear to auscultation bilaterally without wheezes or crackles Cardiovascular: Regular rate and rhythm without murmur gallop or rub normal S1 and S2 Abdomen: Nontender, nondistended, soft, bowel sounds positive, no rebound, no ascites, no appreciable mass -morbidly obese Extremities: No significant cyanosis, clubbing, or edema bilateral lower extremities  CBC: Recent Labs  Lab 08/14/22 0424 08/15/22 0224 08/16/22 0011  WBC 18.2* 16.0* 13.1*  NEUTROABS 16.7*  --  9.9*  HGB 14.1 13.2 13.0  HCT 44.3 41.4 41.8  MCV 95.7 95.8 96.1  PLT 213 194 213   Basic Metabolic Panel: Recent Labs  Lab 08/14/22 0424 08/15/22 0224 08/16/22 0011  NA 137 133* 138  K 3.4* 4.0 3.6  CL 98 102 103  CO2 24 21* 26  GLUCOSE 142* 210* 122*  BUN 16 20 25*  CREATININE 1.35* 1.41* 1.65*  CALCIUM 9.2 8.4* 8.8*   GFR: Estimated Creatinine Clearance: 46.3 mL/min (A) (by C-G formula based on SCr of 1.65 mg/dL (H)).   Scheduled Meds:  apixaban  5 mg Oral BID   atorvastatin  20 mg Oral Daily   Chlorhexidine Gluconate Cloth  6 each  Topical Q0600   docusate sodium  100 mg Oral BID   famotidine  20 mg Oral BID   folic acid  1 mg Oral Daily   LORazepam  0-4 mg Intravenous Q6H   Followed by   LORazepam  0-4 mg Intravenous Q12H   metoprolol succinate  25 mg Oral Daily   mometasone-formoterol  2 puff Inhalation BID   multivitamin with minerals  1 tablet Oral Daily   mupirocin  ointment  1 Application Nasal BID   sodium chloride flush  3 mL Intravenous Q12H   thiamine  100 mg Oral Daily   Or   thiamine  100 mg Intravenous Daily   Continuous Infusions:  cefTRIAXone (ROCEPHIN)  IV 2 g (08/15/22 1245)     LOS: 2 days   Lonia Blood, MD [facility] Office  [phone] Pager - Text Page per Amion  If 7PM-7AM, please contact night-coverage per Amion 08/16/2022, 8:45 AM

## 2022-08-17 DIAGNOSIS — N139 Obstructive and reflux uropathy, unspecified: Secondary | ICD-10-CM | POA: Diagnosis not present

## 2022-08-17 LAB — BASIC METABOLIC PANEL
Anion gap: 8 (ref 5–15)
BUN: 21 mg/dL (ref 8–23)
CO2: 25 mmol/L (ref 22–32)
Calcium: 8.6 mg/dL — ABNORMAL LOW (ref 8.9–10.3)
Chloride: 105 mmol/L (ref 98–111)
Creatinine, Ser: 1.17 mg/dL — ABNORMAL HIGH (ref 0.44–1.00)
GFR, Estimated: 53 mL/min — ABNORMAL LOW (ref >60)
Glucose, Bld: 115 mg/dL — ABNORMAL HIGH (ref 70–99)
Potassium: 3.5 mmol/L (ref 3.5–5.1)
Sodium: 138 mmol/L (ref 135–145)

## 2022-08-17 LAB — CBC
HCT: 38 % (ref 36.0–46.0)
Hemoglobin: 11.8 g/dL — ABNORMAL LOW (ref 12.0–15.0)
MCH: 30.1 pg (ref 26.0–34.0)
MCHC: 31.1 g/dL (ref 30.0–36.0)
MCV: 96.9 fL (ref 80.0–100.0)
Platelets: 215 10*3/uL (ref 150–400)
RBC: 3.92 MIL/uL (ref 3.87–5.11)
RDW: 13.4 % (ref 11.5–15.5)
WBC: 10 10*3/uL (ref 4.0–10.5)
nRBC: 0 % (ref 0.0–0.2)

## 2022-08-17 LAB — HEMOGLOBIN A1C
Hgb A1c MFr Bld: 5.9 % — ABNORMAL HIGH (ref 4.8–5.6)
Mean Plasma Glucose: 122.63 mg/dL

## 2022-08-17 MED ORDER — OXYCODONE HCL 5 MG PO TABS: 5.0000 mg | ORAL_TABLET | ORAL | 0 refills | Status: DC | PRN

## 2022-08-17 MED ORDER — CEFDINIR 300 MG PO CAPS: 300.0000 mg | ORAL_CAPSULE | Freq: Two times a day (BID) | ORAL | Status: DC

## 2022-08-17 MED ORDER — CEFDINIR 300 MG PO CAPS
300.0000 mg | ORAL_CAPSULE | Freq: Two times a day (BID) | ORAL | Status: DC
  Administered 2022-08-17: 300 mg via ORAL
  Filled 2022-08-17 (×2): qty 1

## 2022-08-17 MED ORDER — CEFDINIR 300 MG PO CAPS: 300.0000 mg | ORAL_CAPSULE | Freq: Two times a day (BID) | ORAL | 0 refills | Status: AC

## 2022-08-17 MED ORDER — ACETAMINOPHEN 500 MG PO TABS: 500.0000 mg | ORAL_TABLET | Freq: Four times a day (QID) | ORAL | 0 refills | Status: DC | PRN

## 2022-08-17 NOTE — Discharge Summary (Signed)
DISCHARGE SUMMARY  [name]  MR#: [id number]  DOB:06/01/1960  Date of Admission: 08/14/2022 Date of Discharge: 08/17/2022  Attending Physician:Bert Ptacek [name], MD  Patient's WUJ:WJXB, Susan Blalock, MD  Consults: Urology   Disposition: D/C home   Follow-up Appts:  Follow-up Information     [name], MD. Schedule an appointment as soon as possible for a visit.   Specialty: Urology Contact information: [address] [place] [address] [phone]         Corwin Levins, MD Follow up.   Specialties: Internal Medicine, Radiology Contact information: [address] [place] [address] [phone]                 Tests Needing Follow-up: - will need URS scheduled for definitive stone management as an outpatient  Discharge Diagnoses: Obstructing left ureteral stone Complicated E. coli UTI Pan sensitive E. coli bacteremia Acute kidney failure on stage IIIa CKD Alcohol dependence HTN Hypokalemia Transaminitis Morbid obesity - Body mass index is 55.55 kg/m. History of PE History of paroxysmal atrial fibrillation  Initial presentation: 62 year old with a history of prior bladder abscess/fistula related to diverticular abscess 2018, nephrolithiasis, alcohol dependence, chronic atrial fibrillation, HTN, HLD, prior PE, morbid obesity, and impaired glucose tolerance who presented to the ER with the acute onset of abdominal pain and fever up to 104 which persisted for 4-5 days. CT abdomen pelvis in the ER revealed a 7 mm left proximal ureteral stone with hydronephrosis as well as stones within the left lower pole of the kidney.   Hospital Course:  Obstructing left ureteral stone - complicated E. coli UTI Status post left ureteral stent placement with Dr. Cardell Peach 08/14/2022 -will need URS scheduled for definitive stone management as an outpatient - plan is to treat with directed antibiotic therapy for 10 days - urine and blood cultures confirming E.  coli   Pan sensitive E. coli bacteremia Due to above -will treat with extended course of antibiotics (14 days for this indication) - clinically stabilized   Acute kidney failure on stage IIIa CKD Baseline creatinine on review of records appears to be 1.2-1.35 -renal function has improved beyond her baseline with volume support   Alcohol dependence No evidence of withdrawal during this admission    HTN Blood pressure well-controlled   Hypokalemia Corrected with supplementation   Transaminitis Likely related to chronic alcohol abuse with typical pattern appreciated   Morbid obesity - Body mass index is 55.55 kg/m.   History of PE Continue Eliquis   History of paroxysmal atrial fibrillation Rate well-controlled on metoprolol - continue Eliquis  Allergies as of 08/17/2022       Reactions   Morphine Itching   Shrimp [shellfish Allergy] Itching, Other (See Comments)   Tongue burns also   Tramadol Other (See Comments)   Caused confusion   Covid-19 (mrna) Vaccine Hives   Diltiazem Hcl Itching   Pt with itching of the feet when bolus given   Latex Rash        Medication List     TAKE these medications    acetaminophen 500 MG tablet Commonly known as: TYLENOL Take 1 tablet (500 mg total) by mouth every 6 (six) hours as needed for mild pain, fever or headache.   albuterol 108 (90 Base) MCG/ACT inhaler Commonly known as: Ventolin HFA INHALE 2 PUFFS INTO THE LUNGS EVERY 6 HOURS AS NEEDED What changed:  how much to take how to take this when to take this reasons  to take this additional instructions   atorvastatin 20 MG tablet Commonly known as: LIPITOR TAKE 1 TABLET BY MOUTH EVERY DAY   calcium carbonate 500 MG chewable tablet Commonly known as: TUMS - dosed in mg elemental calcium Chew 1 tablet by mouth daily as needed for indigestion or heartburn.   cefdinir 300 MG capsule Commonly known as: OMNICEF Take 1 capsule (300 mg total) by mouth every 12 (twelve)  hours for 11 days.   Eliquis 5 MG Tabs tablet Generic drug: apixaban Take 5 mg by mouth 2 (two) times daily.   famotidine 20 MG tablet Commonly known as: PEPCID Take 1 tablet (20 mg total) by mouth 2 (two) times daily.   LORazepam 0.5 MG tablet Commonly known as: ATIVAN TAKE 1 TABLET BY MOUTH 2 TIMES DAILY AS NEEDED FOR ANXIETY.   losartan-hydrochlorothiazide 100-25 MG tablet Commonly known as: HYZAAR Take 1 tablet by mouth daily.   Lumify 0.025 % Soln Generic drug: Brimonidine Tartrate Place 1 drop into both eyes daily as needed (redness).   metoprolol succinate 25 MG 24 hr tablet Commonly known as: TOPROL-XL TAKE 1 TABLET (25 MG TOTAL) BY MOUTH DAILY. APPOINTMENT REQUIRED FOR FURTHER REFILLS [phone]   mometasone-formoterol 100-5 MCG/ACT Aero Commonly known as: DULERA Inhale 2 puffs into the lungs in the morning and at bedtime.   ondansetron 4 MG disintegrating tablet Commonly known as: ZOFRAN-ODT TAKE 1 TABLET BY MOUTH EVERY 8 HOURS AS NEEDED FOR NAUSEA AND VOMITING What changed: See the new instructions.   oxyCODONE 5 MG immediate release tablet Commonly known as: Oxy IR/ROXICODONE Take 1-2 tablets (5-10 mg total) by mouth every 3 (three) hours as needed for moderate pain.   pantoprazole 40 MG tablet Commonly known as: Protonix Take 1 tablet (40 mg total) by mouth daily.               Durable Medical Equipment  (From admission, onward)           Start     Ordered   08/16/22 1515  For home use only DME Cane  Once       Comments: Quad cane   08/16/22 1514            Day of Discharge BP 138/86 (BP Location: Right Arm)   Pulse (!) 101   Temp 97.9 F (36.6 C)   Resp 18   Ht 5\' 1"  (1.549 m)   Wt 133.4 kg   SpO2 94%   BMI 55.55 kg/m   Physical Exam: General: No acute respiratory distress Lungs: Clear to auscultation bilaterally without wheezes or crackles Cardiovascular: Regular rate and rhythm without murmur gallop or rub normal S1  and S2 Abdomen: Nontender, nondistended, soft, bowel sounds positive, no rebound, no ascites, no appreciable mass Extremities: No significant cyanosis, clubbing, or edema bilateral lower extremities  Basic Metabolic Panel: Recent Labs  Lab 08/14/22 0424 08/15/22 0224 08/16/22 0011 08/17/22 0306  NA 137 133* 138 138  K 3.4* 4.0 3.6 3.5  CL 98 102 103 105  CO2 24 21* 26 25  GLUCOSE 142* 210* 122* 115*  BUN 16 20 25* 21  CREATININE 1.35* 1.41* 1.65* 1.17*  CALCIUM 9.2 8.4* 8.8* 8.6*    CBC: Recent Labs  Lab 08/14/22 0424 08/15/22 0224 08/16/22 0011 08/17/22 0306  WBC 18.2* 16.0* 13.1* 10.0  NEUTROABS 16.7*  --  9.9*  --   HGB 14.1 13.2 13.0 11.8*  HCT 44.3 41.4 41.8 38.0  MCV 95.7 95.8 96.1 96.9  PLT  213 194 213 215    Time spent in discharge (includes decision making & examination of pt): 35 minutes  08/17/2022, 11:23 AM   Lonia Blood, MD [facility] Office  [phone]

## 2022-08-17 NOTE — Progress Notes (Signed)
   08/17/22   To Whom it may concern,  [name] was hospitalized at [facility] from 08/14/2022 through 08/17/2022.  She has been cleared to return to work on but not before 08/19/2022.    Should you have further questions please feel free to contact our office.  Be advised that no medical information will be divulged without a signed HIPA release from the patient.    Sincerely,  Lonia Blood, MD [facility] Office  [phone]

## 2022-08-17 NOTE — Progress Notes (Signed)
PIV removed. AVS reviewed. Patient provided with work note from physician. Patient verbalizes understanding of necessary medication regimen and follow up appointments.

## 2022-08-18 LAB — ANAEROBIC CULTURE W GRAM STAIN

## 2022-08-19 ENCOUNTER — Encounter: Payer: Self-pay | Admitting: *Deleted

## 2022-08-19 ENCOUNTER — Telehealth: Payer: Self-pay | Admitting: *Deleted

## 2022-08-19 NOTE — Transitions of Care (Post Inpatient/ED Visit) (Signed)
   08/19/2022  Name: [name] MRN: [id number] DOB: 1960-09-17  Today's TOC FU Call Status: Today's TOC FU Call Status:: Unsuccessul Call (1st Attempt) Unsuccessful Call (1st Attempt) Date: 08/19/22  Attempted to reach the patient regarding the most recent Inpatient visit; left HIPAA compliant voice message requesting call back  Follow Up Plan: Additional outreach attempts will be made to reach the patient to complete the Transitions of Care (Post Inpatient visit) call.   Caryl Pina, RN, BSN, CCRN Alumnus RN CM Care Coordination/ Transition of Care- [facility] Care Management [phone]: direct office

## 2022-08-20 ENCOUNTER — Encounter: Payer: Self-pay | Admitting: *Deleted

## 2022-08-20 ENCOUNTER — Telehealth: Payer: Self-pay | Admitting: *Deleted

## 2022-08-20 NOTE — Transitions of Care (Post Inpatient/ED Visit) (Signed)
   08/20/2022  Name: [name] MRN: [id number] DOB: 10/31/1960  Today's TOC FU Call Status: Today's TOC FU Call Status:: Unsuccessful Call (2nd Attempt) Unsuccessful Call (2nd Attempt) Date: 08/20/22  Attempted to reach the patient regarding the most recent Inpatient visit; left HIPAA compliant voice message requesting call back  Follow Up Plan: Additional outreach attempts will be made to reach the patient to complete the Transitions of Care (Post Inpatient visit) call.   Caryl Pina, RN, BSN, CCRN Alumnus RN CM Care Coordination/ Transition of Care- [facility] Care Management [phone]: direct office

## 2022-08-21 ENCOUNTER — Telehealth: Payer: Self-pay | Admitting: *Deleted

## 2022-08-21 ENCOUNTER — Telehealth (HOSPITAL_COMMUNITY): Payer: Self-pay | Admitting: *Deleted

## 2022-08-21 ENCOUNTER — Encounter: Payer: Self-pay | Admitting: *Deleted

## 2022-08-21 ENCOUNTER — Other Ambulatory Visit: Payer: Self-pay | Admitting: Urology

## 2022-08-21 DIAGNOSIS — N3020 Other chronic cystitis without hematuria: Secondary | ICD-10-CM | POA: Diagnosis not present

## 2022-08-21 DIAGNOSIS — N201 Calculus of ureter: Secondary | ICD-10-CM | POA: Diagnosis not present

## 2022-08-21 NOTE — Telephone Encounter (Signed)
Patient scheduled to have the below procedure on 09/12/22. Surgeon Role  [name], MD Primary   Procedure Laterality Anesthesia  CYSTOSCOPY/LEFT RETROGRADE PYELOGRAM/LEFT URETEROSCOPY/HOLMIUM LASER/LEFT STENT EXCHANGE       Dr. Cardell Peach is requesting permission to hold Eliquis 3 days prior to procedure.

## 2022-08-21 NOTE — Transitions of Care (Post Inpatient/ED Visit) (Signed)
   08/21/2022  Name: [name] MRN: [id number] DOB: December 07, 1960  Today's TOC FU Call Status: Today's TOC FU Call Status:: Unsuccessful Call (3rd Attempt) Unsuccessful Call (3rd Attempt) Date: 08/21/22  Attempted to reach the patient regarding the most recent Inpatient visit; left HIPAA compliant voice message requesting call back  Follow Up Plan: No further outreach attempts will be made at this time. We have been unable to contact the patient.  Caryl Pina, RN, BSN, CCRN Alumnus RN CM Care Coordination/ Transition of Care- [facility] Care Management [phone]: direct office

## 2022-08-22 ENCOUNTER — Encounter (HOSPITAL_COMMUNITY): Payer: Self-pay

## 2022-08-22 NOTE — Telephone Encounter (Signed)
Patient with diagnosis of afib on Eliquis for anticoagulation.    Procedure: cystoscopy Date of procedure: 09/12/22  CHA2DS2-VASc Score = 4  This indicates a 4.8% annual risk of stroke. The patient's score is based upon: CHF History: 0 HTN History: 1 Diabetes History: 0 Stroke History: 2 (prior PE) Vascular Disease History: 0 Age Score: 0 Gender Score: 1  PE 07/06/18.  CrCl 81mL/min using adjusted body weight due to morbid obesity Platelet count 215K  Per office protocol, patient can hold Eliquis for 3 days prior to procedure as requested.    **This guidance is not considered finalized until pre-operative APP has relayed final recommendations.**

## 2022-08-22 NOTE — Telephone Encounter (Signed)
   Patient Name: [name]  DOB: Jan 10, 1961 MRN: [id number]  Primary Cardiologist: None  Chart reviewed as part of pre-operative protocol coverage. Pre-op clearance already addressed by colleagues in earlier phone notes. To summarize recommendations:  -Per office protocol, patient can hold Eliquis for 3 days prior to procedure as requested.   Medical clearance was not requested.  Will route this bundled recommendation to requesting provider via Epic fax function and remove from pre-op pool. Please call with questions.  Sharlene Dory, PA-C 08/22/2022, 4:21 PM

## 2022-08-22 NOTE — Telephone Encounter (Signed)
Faxed to [facility]  

## 2022-09-06 ENCOUNTER — Other Ambulatory Visit: Payer: Self-pay | Admitting: Internal Medicine

## 2022-09-08 ENCOUNTER — Other Ambulatory Visit: Payer: Self-pay

## 2022-09-09 NOTE — Patient Instructions (Signed)
SURGICAL WAITING ROOM VISITATION  Patients having surgery or a procedure may have no more than 2 support people in the waiting area - these visitors may rotate.    Children under the age of 61 must have an adult with them who is not the patient.  Due to an increase in RSV and influenza rates and associated hospitalizations, children ages 41 and under may not visit patients in [facility] hospitals.  If the patient needs to stay at the hospital during part of their recovery, the visitor guidelines for inpatient rooms apply. Pre-op nurse will coordinate an appropriate time for 1 support person to accompany patient in pre-op.  This support person may not rotate.    Please refer to the [facility] website for the visitor guidelines for Inpatients (after your surgery is over and you are in a regular room).    Your procedure is scheduled on: 09/12/22   Report to [facility] Main Entrance    Report to admitting at 11:00 AM   Call this number if you have problems the morning of surgery [phone]   Do not eat food or drink liquids :After Midnight.    Oral Hygiene is also important to reduce your risk of infection.                                    Remember - BRUSH YOUR TEETH THE MORNING OF SURGERY WITH YOUR REGULAR TOOTHPASTE    Stop all vitamins and herbal supplements 7 days before surgery.   Take these medicines the morning of surgery with A SIP OF WATER: Albuterol, Atorvastatin, famotidine, Ativan, Metoprolol, Dulera, Zofran, Oxycodone, Pantoprazole   These are anesthesia recommendations for holding your anticoagulants.  Please contact your prescribing physician to confirm IF it is safe to hold your anticoagulants for this length of time.   Eliquis Apixaban   72 hours   Xarelto Rivaroxaban   72 hours  Plavix Clopidogrel   120 hours  Pletal Cilostazol   120 hours    DO NOT TAKE ANY ORAL DIABETIC MEDICATIONS DAY OF YOUR SURGERY             You may not have any metal on  your body including hair pins, jewelry, and body piercing             Do not wear make-up, lotions, powders, perfumes, or deodorant  Do not wear nail polish including gel and S&S, artificial/acrylic nails, or any other type of covering on natural nails including finger and toenails. If you have artificial nails, gel coating, etc. that needs to be removed by a nail salon please have this removed prior to surgery or surgery may need to be canceled/ delayed if the surgeon/ anesthesia feels like they are unable to be safely monitored.   Do not shave  48 hours prior to surgery.    Do not bring valuables to the hospital. [facility] IS NOT             RESPONSIBLE   FOR VALUABLES.   Contacts, glasses, dentures or bridgework may not be worn into surgery.  DO NOT BRING YOUR HOME MEDICATIONS TO THE HOSPITAL. PHARMACY WILL DISPENSE MEDICATIONS LISTED ON YOUR MEDICATION LIST TO YOU DURING YOUR ADMISSION IN THE HOSPITAL!    Patients discharged on the day of surgery will not be allowed to drive home.  Someone NEEDS to stay with you for the first  24 hours after anesthesia.   Special Instructions: Bring a copy of your healthcare power of attorney and living will documents the day of surgery if you haven't scanned them before.              Please read over the following fact sheets you were given: IF YOU HAVE QUESTIONS ABOUT YOUR PRE-OP INSTRUCTIONS PLEASE CALL [phone] [facility]   If you received a COVID test during your pre-op visit  it is requested that you wear a mask when out in public, stay away from anyone that may not be feeling well and notify your surgeon if you develop symptoms. If you test positive for Covid or have been in contact with anyone that has tested positive in the last 10 days please notify you surgeon.    [facility] - Preparing for Surgery Before surgery, you can play an important role.  Because skin is not sterile, your skin needs to be as free of germs as possible.  You can  reduce the number of germs on your skin by washing with CHG (chlorahexidine gluconate) soap before surgery.  CHG is an antiseptic cleaner which kills germs and bonds with the skin to continue killing germs even after washing. Please DO NOT use if you have an allergy to CHG or antibacterial soaps.  If your skin becomes reddened/irritated stop using the CHG and inform your nurse when you arrive at Short Stay. Do not shave (including legs and underarms) for at least 48 hours prior to the first CHG shower.  You may shave your face/neck.  Please follow these instructions carefully:  1.  Shower with CHG Soap the night before surgery and the  morning of surgery.  2.  If you choose to wash your hair, wash your hair first as usual with your normal  shampoo.  3.  After you shampoo, rinse your hair and body thoroughly to remove the shampoo.                             4.  Use CHG as you would any other liquid soap.  You can apply chg directly to the skin and wash.  Gently with a scrungie or clean washcloth.  5.  Apply the CHG Soap to your body ONLY FROM THE NECK DOWN.   Do   not use on face/ open                           Wound or open sores. Avoid contact with eyes, ears mouth and   genitals (private parts).                       Wash face,  Genitals (private parts) with your normal soap.             6.  Wash thoroughly, paying special attention to the area where your    surgery  will be performed.  7.  Thoroughly rinse your body with warm water from the neck down.  8.  DO NOT shower/wash with your normal soap after using and rinsing off the CHG Soap.                9.  Pat yourself dry with a clean towel.            10.  Wear clean pajamas.  11.  Place clean sheets on your bed the night of your first shower and do not  sleep with pets. Day of Surgery : Do not apply any lotions/deodorants the morning of surgery.  Please wear clean clothes to the hospital/surgery center.  FAILURE TO FOLLOW THESE  INSTRUCTIONS MAY RESULT IN THE CANCELLATION OF YOUR SURGERY  PATIENT SIGNATURE_________________________________  NURSE SIGNATURE__________________________________  ________________________________________________________________________

## 2022-09-09 NOTE — Progress Notes (Addendum)
COVID Vaccine Completed: yes  Date of COVID positive in last 90 days:  PCP - Oliver Barre, MD Cardiologist - Rudi Coco, NP LOV 04/17/21 Epic  Chest x-ray - 08/14/22 Epic EKG - 08/15/22 Epic Stress Test -  ECHO - 04/03/20 Epic Cardiac Cath -  Pacemaker/ICD device last checked: Spinal Cord Stimulator:  Bowel Prep -   Sleep Study -  CPAP -   Fasting Blood Sugar - pre DM Checks Blood Sugar _____ times a day  Last dose of GLP1 agonist-  N/A GLP1 instructions:  N/A   Last dose of SGLT-2 inhibitors-  N/A SGLT-2 instructions: N/A   Blood Thinner Instructions:  Eliquis, hold 3 days Aspirin Instructions: Last Dose:  Activity level:  Can go up a flight of stairs and perform activities of daily living without stopping and without symptoms of chest pain or shortness of breath.  Able to exercise without symptoms  Unable to go up a flight of stairs without symptoms of     Anesthesia review: a fib, HTN, PE, asthma exacerbation, CKD, palpitations, preDM, heart murmur  Patient denies shortness of breath, fever, cough and chest pain at PAT appointment  Patient verbalized understanding of instructions that were given to them at the PAT appointment. Patient was also instructed that they will need to review over the PAT instructions again at home before surgery.

## 2022-09-10 ENCOUNTER — Other Ambulatory Visit: Payer: Self-pay

## 2022-09-10 ENCOUNTER — Encounter (HOSPITAL_COMMUNITY)
Admission: RE | Admit: 2022-09-10 | Discharge: 2022-09-10 | Disposition: A | Discharge status: Home / Self Care | Payer: 59 | Source: Ambulatory Visit | Attending: Urology | Admitting: Urology

## 2022-09-10 ENCOUNTER — Encounter (HOSPITAL_COMMUNITY): Payer: Self-pay

## 2022-09-10 VITALS — BP 135/87 | HR 98 | Temp 98.3°F | Resp 20 | Ht 61.0 in | Wt 295.0 lb

## 2022-09-10 DIAGNOSIS — N201 Calculus of ureter: Secondary | ICD-10-CM | POA: Insufficient documentation

## 2022-09-10 DIAGNOSIS — Z01812 Encounter for preprocedural laboratory examination: Secondary | ICD-10-CM | POA: Diagnosis not present

## 2022-09-10 DIAGNOSIS — Z7901 Long term (current) use of anticoagulants: Secondary | ICD-10-CM | POA: Insufficient documentation

## 2022-09-10 DIAGNOSIS — I480 Paroxysmal atrial fibrillation: Secondary | ICD-10-CM | POA: Insufficient documentation

## 2022-09-10 DIAGNOSIS — Z01818 Encounter for other preprocedural examination: Secondary | ICD-10-CM | POA: Diagnosis not present

## 2022-09-10 DIAGNOSIS — N3020 Other chronic cystitis without hematuria: Secondary | ICD-10-CM | POA: Diagnosis not present

## 2022-09-10 LAB — BASIC METABOLIC PANEL
Anion gap: 11 (ref 5–15)
BUN: 26 mg/dL — ABNORMAL HIGH (ref 8–23)
CO2: 27 mmol/L (ref 22–32)
Calcium: 9.2 mg/dL (ref 8.9–10.3)
Chloride: 104 mmol/L (ref 98–111)
Creatinine, Ser: 1.18 mg/dL — ABNORMAL HIGH (ref 0.44–1.00)
GFR, Estimated: 53 mL/min — ABNORMAL LOW (ref >60)
Glucose, Bld: 99 mg/dL (ref 70–99)
Potassium: 4.2 mmol/L (ref 3.5–5.1)
Sodium: 142 mmol/L (ref 135–145)

## 2022-09-10 LAB — CBC
HCT: 44.3 % (ref 36.0–46.0)
Hemoglobin: 13.8 g/dL (ref 12.0–15.0)
MCH: 30.1 pg (ref 26.0–34.0)
MCHC: 31.2 g/dL (ref 30.0–36.0)
MCV: 96.7 fL (ref 80.0–100.0)
Platelets: 246 10*3/uL (ref 150–400)
RBC: 4.58 MIL/uL (ref 3.87–5.11)
RDW: 13.9 % (ref 11.5–15.5)
WBC: 11 10*3/uL — ABNORMAL HIGH (ref 4.0–10.5)
nRBC: 0 % (ref 0.0–0.2)

## 2022-09-10 NOTE — Progress Notes (Addendum)
COVID Vaccine Completed: yes  Date of COVID positive in last 90 days: No  PCP - Oliver Barre, MD Cardiologist - Rudi Coco, NP LOV 04/17/21 Epic  Chest x-ray - 08/14/22 Epic EKG - 08/15/22 Epic Stress Test -  ECHO - 04/03/20 Epic Cardiac Cath - no Pacemaker/ICD device last checked:No Spinal Cord Stimulator:No  Bowel Prep - n/a  Sleep Study - No CPAP - No  Fasting Blood Sugar - pre DM No Checks Blood Sugar ___0__ times a day  Last dose of GLP1 agonist-  N/A GLP1 instructions:  N/A   Last dose of SGLT-2 inhibitors-  N/A SGLT-2 instructions: N/A   Blood Thinner Instructions:  Eliquis, hold 3 days Aspirin Instructions: No Last Dose:  Activity level:  Can go up a flight of stairs and perform activities of daily living without stopping and without symptoms of chest pain or shortness of breath.  Able to exercise without symptoms  Unable to go up a flight of stairs without symptoms of     Anesthesia review: a fib, HTN, PE, asthma exacerbation, CKD, palpitations, preDM, heart murmur  Patient denies shortness of breath, fever, cough and chest pain at PAT appointment  Patient verbalized understanding of instructions that were given to them at the PAT appointment. Patient was also instructed that they will need to review over the PAT instructions again at home before surgery.

## 2022-09-11 DIAGNOSIS — Z419 Encounter for procedure for purposes other than remedying health state, unspecified: Secondary | ICD-10-CM | POA: Diagnosis not present

## 2022-09-11 NOTE — Anesthesia Preprocedure Evaluation (Addendum)
Anesthesia Evaluation  Patient identified by MRN, date of birth, ID band Patient awake    Reviewed: Allergy & Precautions, NPO status , Patient's Chart, lab work & pertinent test results, reviewed documented beta blocker date and time   History of Anesthesia Complications Negative for: history of anesthetic complications  Airway Mallampati: II  TM Distance: >3 FB Neck ROM: Full    Dental  (+) Missing, Dental Advisory Given   Pulmonary asthma , COPD,  COPD inhaler, former smoker, PE   breath sounds clear to auscultation       Cardiovascular hypertension, Pt. on medications and Pt. on home beta blockers (-) angina + dysrhythmias  Rhythm:Regular Rate:Normal  '22 ECHO: EF 50-55%, low normal LVF, no regional wall motion abnormalities, nild LVH, normal RVF, small circumferential pericardial effusion, no significant valvular abnormalities   Neuro/Psych   Anxiety Depression    negative neurological ROS     GI/Hepatic Neg liver ROS,GERD  Medicated and Poorly Controlled,,(+)       alcohol use  Endo/Other  BMI 55.7  Renal/GU Renal InsufficiencyRenal diseaseNauseated with stone pain     Musculoskeletal  (+) Arthritis ,    Abdominal  (+) + obese  Peds  Hematology eliquis   Anesthesia Other Findings   Reproductive/Obstetrics                             Anesthesia Physical Anesthesia Plan  ASA: 3  Anesthesia Plan: General   Post-op Pain Management: Tylenol PO (pre-op)*   Induction: Intravenous and Rapid sequence  PONV Risk Score and Plan: 3 and Ondansetron, Dexamethasone and Scopolamine patch - Pre-op  Airway Management Planned: Oral ETT  Additional Equipment: None  Intra-op Plan:   Post-operative Plan: Extubation in OR  Informed Consent: I have reviewed the patients History and Physical, chart, labs and discussed the procedure including the risks, benefits and alternatives for the  proposed anesthesia with the patient or authorized representative who has indicated his/her understanding and acceptance.     Dental advisory given  Plan Discussed with: CRNA and Surgeon  Anesthesia Plan Comments: (See PAT note from 7/31 by Sherlie Ban PA-C )        Anesthesia Quick Evaluation

## 2022-09-11 NOTE — Progress Notes (Signed)
Choose an anesthesia record to view details        DISCUSSION: [name] is a 62 yo female who presents to PAT prior to cystoscopy/ureteroscopy with lithotripsy and stent exchange with Dr. Cardell Peach on 09/12/22. PMH significant for former smoking, asthma/COPD, kidney stones, ETOH abuse with hx of ETOH withdrawal, GERD, A fib on Eliquis, hx of PE, CKD, pre-diabetes, HTN, HLD, obesity.   No prior anesthesia complications  Patient was admitted from 08/14/22-08/17/22 due to urosepsis from an obstructing 7mm left sided kidney stone and AKI. She was fluid resuscitated and treated with IV antibiotics. She went to the OR on 08/14/22 for stent placement with Dr. Cardell Peach. Now scheduled for definitive management. She has completed her outpatient course of antibiotics and her AKI has resolved on screening PAT labs.  Cardiology has cleared her to hold her Eliquis for 3 days prior to procedure. She has not been seen in clinic since 04/17/21. Due to urgency of case, anticipate patient can proceed without formal clearance. Hospitalist noted her to be in SR during admission in July. Takes a nodal blocker. She denies CP/SOB at PAT visit.  VS: BP 135/87   Pulse 98   Temp 36.8 C (Oral)   Resp 20   Ht 5\' 1"  (1.549 m)   Wt 133.8 kg   SpO2 95%   BMI 55.74 kg/m   PROVIDERS: Corwin Levins, MD   LABS: Labs reviewed: Acceptable for surgery. (all labs ordered are listed, but only abnormal results are displayed)  Labs Reviewed  BASIC METABOLIC PANEL - Abnormal; Notable for the following components:      Result Value   BUN 26 (*)    Creatinine, Ser 1.18 (*)    GFR, Estimated 53 (*)    All other components within normal limits  CBC - Abnormal; Notable for the following components:   WBC 11.0 (*)    All other components within normal limits     IMAGES:  CT A/P 08/14/22:  IMPRESSION: 1. Obstructing 7 mm stone in the proximal left ureter. Multiple left renal calculi. 2. Gas in the collapsed urinary bladder which is  indistinguishable from pelvic small bowel loops, question persisting fistula. 3. Marked hepatic steatosis with hemangiomas.  CXR 08/14/22:  IMPRESSION: Cardiomegaly and vascular congestion with low lung volumes.   EKG 08/14/22:  Sinus tachycardia, rate 105 (patient in ED) PACs Borderline prolonged QT   CV:  Echo 04/03/2020  IMPRESSIONS     1. Left ventricular ejection fraction, by estimation, is 50 to 55%. The  left ventricle has low normal function. The left ventricle has no regional  wall motion abnormalities. There is mild concentric left ventricular  hypertrophy. Left ventricular  diastolic parameters are indeterminate.   2. Right ventricular systolic function is normal. The right ventricular  size is normal. Tricuspid regurgitation signal is inadequate for assessing  PA pressure.   3. Left atrial size was mildly dilated.   4. Right atrial size was mildly dilated.   5. A small pericardial effusion is present. The pericardial effusion is  circumferential.   6. The mitral valve is normal in structure. No evidence of mitral valve  regurgitation. No evidence of mitral stenosis.   7. The aortic valve is grossly normal. Aortic valve regurgitation is not  visualized. No aortic stenosis is present.   8. The inferior vena cava is normal in size with <50% respiratory  variability, suggesting right atrial pressure of 8 mmHg.   Comparison(s): Changes from prior study are noted. EF  now low normal.   Conclusion(s)/Recommendation(s): Otherwise normal echocardiogram, with  minor abnormalities described in the report.   Past Medical History:  Diagnosis Date   Abscess of bladder 07/28/2016   Alcohol dependence (HCC)    Allergic rhinitis 12/06/2013   Anxiety    Asthma 03/16/2015   Atrial flutter with rapid ventricular response (HCC) 04/01/2020   COLONIC POLYPS, HX OF 04/05/2010   DIVERTICULITIS, HX OF 04/05/2010   DJD (degenerative joint disease)    right knee, mot to severe    Dysrhythmia    GERD (gastroesophageal reflux disease)    no meds   Heart murmur    hx of    History of kidney stones    Hyperlipidemia    Hypertension    Impaired glucose tolerance 12/06/2013   Morbid obesity with BMI of 50.0-59.9, adult (HCC)    Nausea & vomiting 05/24/2022   Peripheral vascular disease (HCC)    Pulmonary embolism (HCC)     Past Surgical History:  Procedure Laterality Date   ABDOMINAL HYSTERECTOMY  age 51   fibroids   BREAST BIOPSY Left    COLONOSCOPY WITH PROPOFOL N/A 07/25/2016   Procedure: COLONOSCOPY WITH PROPOFOL;  Surgeon: Jeani Hawking, MD;  Location: [facility];  Service: Endoscopy;  Laterality: N/A;   colonscopy     x 2   CYSTOSCOPY W/ URETERAL STENT PLACEMENT Right 09/05/2021   Procedure: CYSTOSCOPY WITH RETROGRADE PYELOGRAM/URETERAL STENT PLACEMENT;  Surgeon: [name], MD;  Location: [facility];  Service: Urology;  Laterality: Right;   CYSTOSCOPY WITH RETROGRADE PYELOGRAM, URETEROSCOPY AND STENT PLACEMENT Left 08/14/2022   Procedure: CYSTOSCOPY WITH RETROGRADE PYELOGRAM;  Surgeon: [name], MD;  Location: [facility] OR;  Service: Urology;  Laterality: Left;   CYSTOSCOPY WITH URETEROSCOPY AND STENT PLACEMENT Left 08/14/2022   Procedure: URETEROSCOPY AND STENT PLACEMENT;  Surgeon: [name], MD;  Location: [facility] OR;  Service: Urology;  Laterality: Left;   CYSTOSCOPY/URETEROSCOPY/HOLMIUM LASER/STENT PLACEMENT Right 09/30/2021   Procedure: CYSTOSCOPY/ RETROGRADE/URETEROSCOPY/HOLMIUM LASER/STENT PLACEMENT;  Surgeon: [name], MD;  Location: [facility];  Service: Urology;  Laterality: Right;   IR RADIOLOGIST EVAL & MGMT  08/12/2016   IR RADIOLOGIST EVAL & MGMT  08/21/2016   KNEE ARTHROSCOPY     left    TOTAL KNEE ARTHROPLASTY  07/29/2011   Procedure: TOTAL KNEE ARTHROPLASTY;  Surgeon: Shelda Pal, MD;  Location: [facility];  Service: Orthopedics;  Laterality: Right;   TOTAL KNEE ARTHROPLASTY Left 12/14/2012   Procedure: LEFT TOTAL KNEE ARTHROPLASTY;   Surgeon: Shelda Pal, MD;  Location: [facility];  Service: Orthopedics;  Laterality: Left;    MEDICATIONS:  albuterol (VENTOLIN HFA) 108 (90 Base) MCG/ACT inhaler   acetaminophen (TYLENOL) 500 MG tablet   atorvastatin (LIPITOR) 20 MG tablet   Brimonidine Tartrate (LUMIFY) 0.025 % SOLN   calcium carbonate (TUMS - DOSED IN MG ELEMENTAL CALCIUM) 500 MG chewable tablet   ELIQUIS 5 MG TABS tablet   famotidine (PEPCID) 20 MG tablet   LORazepam (ATIVAN) 0.5 MG tablet   losartan-hydrochlorothiazide (HYZAAR) 100-25 MG tablet   metoprolol succinate (TOPROL-XL) 25 MG 24 hr tablet   mometasone-formoterol (DULERA) 100-5 MCG/ACT AERO   ondansetron (ZOFRAN-ODT) 4 MG disintegrating tablet   oxyCODONE (OXY IR/ROXICODONE) 5 MG immediate release tablet   pantoprazole (PROTONIX) 40 MG tablet   Potassium Citrate 15 MEQ (1620 MG) TBCR   triamcinolone cream (KENALOG) 0.1 %   No current facility-administered medications for this encounter.   Ubaldo Glassing, PA-C [facility]  Short Stay/Anesthesiology [facility] Phone [phone] 09/11/2022 10:07 AM

## 2022-09-12 ENCOUNTER — Ambulatory Visit (HOSPITAL_COMMUNITY): Payer: 59 | Admitting: Medical

## 2022-09-12 ENCOUNTER — Encounter (HOSPITAL_COMMUNITY)
Admission: RE | Disposition: A | Discharge status: Home / Self Care | Payer: Self-pay | Source: Home / Self Care | Attending: Urology

## 2022-09-12 ENCOUNTER — Ambulatory Visit (HOSPITAL_COMMUNITY)
Admission: RE | Admit: 2022-09-12 | Discharge: 2022-09-12 | Disposition: A | Discharge status: Home / Self Care | Payer: 59 | Attending: Urology | Admitting: Urology

## 2022-09-12 ENCOUNTER — Encounter (HOSPITAL_COMMUNITY): Payer: Self-pay | Admitting: Urology

## 2022-09-12 ENCOUNTER — Ambulatory Visit (HOSPITAL_COMMUNITY): Payer: 59

## 2022-09-12 ENCOUNTER — Ambulatory Visit (HOSPITAL_BASED_OUTPATIENT_CLINIC_OR_DEPARTMENT_OTHER): Payer: 59

## 2022-09-12 DIAGNOSIS — N202 Calculus of kidney with calculus of ureter: Secondary | ICD-10-CM | POA: Insufficient documentation

## 2022-09-12 DIAGNOSIS — I4892 Unspecified atrial flutter: Secondary | ICD-10-CM | POA: Diagnosis not present

## 2022-09-12 DIAGNOSIS — J449 Chronic obstructive pulmonary disease, unspecified: Secondary | ICD-10-CM

## 2022-09-12 DIAGNOSIS — N201 Calculus of ureter: Secondary | ICD-10-CM

## 2022-09-12 DIAGNOSIS — I10 Essential (primary) hypertension: Secondary | ICD-10-CM | POA: Diagnosis not present

## 2022-09-12 DIAGNOSIS — Z87442 Personal history of urinary calculi: Secondary | ICD-10-CM | POA: Diagnosis not present

## 2022-09-12 DIAGNOSIS — Z7901 Long term (current) use of anticoagulants: Secondary | ICD-10-CM | POA: Diagnosis not present

## 2022-09-12 DIAGNOSIS — K219 Gastro-esophageal reflux disease without esophagitis: Secondary | ICD-10-CM | POA: Diagnosis not present

## 2022-09-12 DIAGNOSIS — Z87891 Personal history of nicotine dependence: Secondary | ICD-10-CM | POA: Diagnosis not present

## 2022-09-12 DIAGNOSIS — Z86711 Personal history of pulmonary embolism: Secondary | ICD-10-CM | POA: Insufficient documentation

## 2022-09-12 DIAGNOSIS — J4489 Other specified chronic obstructive pulmonary disease: Secondary | ICD-10-CM | POA: Diagnosis not present

## 2022-09-12 DIAGNOSIS — Z6841 Body Mass Index (BMI) 40.0 and over, adult: Secondary | ICD-10-CM | POA: Diagnosis not present

## 2022-09-12 DIAGNOSIS — E785 Hyperlipidemia, unspecified: Secondary | ICD-10-CM | POA: Diagnosis not present

## 2022-09-12 HISTORY — PX: CYSTOSCOPY/URETEROSCOPY/HOLMIUM LASER/STENT PLACEMENT: SHX6546

## 2022-09-12 SURGERY — CYSTOSCOPY/URETEROSCOPY/HOLMIUM LASER/STENT PLACEMENT
Anesthesia: General | Site: Ureter | Laterality: Left

## 2022-09-12 MED ORDER — MIDAZOLAM HCL 2 MG/2ML IJ SOLN: 0.5000 mg | Freq: Once | INTRAMUSCULAR | Status: DC | PRN

## 2022-09-12 MED ORDER — PROPOFOL 10 MG/ML IV BOLUS
INTRAVENOUS | Status: AC
  Filled 2022-09-12: qty 20

## 2022-09-12 MED ORDER — LIDOCAINE HCL (PF) 2 % IJ SOLN
INTRAMUSCULAR | Status: AC
  Filled 2022-09-12: qty 5

## 2022-09-12 MED ORDER — PROMETHAZINE HCL 25 MG/ML IJ SOLN: 6.2500 mg | INTRAMUSCULAR | Status: DC | PRN

## 2022-09-12 MED ORDER — IOHEXOL 300 MG/ML  SOLN
INTRAMUSCULAR | Status: DC | PRN
  Administered 2022-09-12: 2.5 mL

## 2022-09-12 MED ORDER — MEPERIDINE HCL 50 MG/ML IJ SOLN: 6.2500 mg | INTRAMUSCULAR | Status: DC | PRN

## 2022-09-12 MED ORDER — OXYCODONE HCL 5 MG PO TABS: 5.0000 mg | ORAL_TABLET | ORAL | 0 refills | Status: DC | PRN

## 2022-09-12 MED ORDER — ONDANSETRON HCL 4 MG/2ML IJ SOLN
INTRAMUSCULAR | Status: AC
  Filled 2022-09-12: qty 2

## 2022-09-12 MED ORDER — LACTATED RINGERS IV SOLN: INTRAVENOUS | Status: DC

## 2022-09-12 MED ORDER — OXYCODONE HCL 5 MG PO TABS: 5.0000 mg | ORAL_TABLET | Freq: Once | ORAL | Status: DC | PRN

## 2022-09-12 MED ORDER — SUCCINYLCHOLINE CHLORIDE 200 MG/10ML IV SOSY
PREFILLED_SYRINGE | INTRAVENOUS | Status: DC | PRN
  Administered 2022-09-12: 200 mg via INTRAVENOUS

## 2022-09-12 MED ORDER — SODIUM CHLORIDE 0.9 % IR SOLN
Status: DC | PRN
  Administered 2022-09-12: 6000 mL

## 2022-09-12 MED ORDER — ROCURONIUM BROMIDE 10 MG/ML (PF) SYRINGE
PREFILLED_SYRINGE | INTRAVENOUS | Status: AC
  Filled 2022-09-12: qty 10

## 2022-09-12 MED ORDER — DEXAMETHASONE SODIUM PHOSPHATE 10 MG/ML IJ SOLN
INTRAMUSCULAR | Status: AC
  Filled 2022-09-12: qty 1

## 2022-09-12 MED ORDER — FENTANYL CITRATE (PF) 100 MCG/2ML IJ SOLN
INTRAMUSCULAR | Status: AC
  Filled 2022-09-12: qty 2

## 2022-09-12 MED ORDER — LIDOCAINE HCL (CARDIAC) PF 100 MG/5ML IV SOSY
PREFILLED_SYRINGE | INTRAVENOUS | Status: DC | PRN
  Administered 2022-09-12: 80 mg via INTRAVENOUS

## 2022-09-12 MED ORDER — ORAL CARE MOUTH RINSE: 15.0000 mL | Freq: Once | OROMUCOSAL | Status: DC

## 2022-09-12 MED ORDER — FENTANYL CITRATE (PF) 100 MCG/2ML IJ SOLN
INTRAMUSCULAR | Status: DC | PRN
  Administered 2022-09-12 (×3): 50 ug via INTRAVENOUS

## 2022-09-12 MED ORDER — MIDAZOLAM HCL 5 MG/5ML IJ SOLN
INTRAMUSCULAR | Status: DC | PRN
  Administered 2022-09-12: 2 mg via INTRAVENOUS
  Administered 2022-09-12: 1 mg via INTRAVENOUS

## 2022-09-12 MED ORDER — SUCCINYLCHOLINE CHLORIDE 200 MG/10ML IV SOSY
PREFILLED_SYRINGE | INTRAVENOUS | Status: AC
  Filled 2022-09-12: qty 10

## 2022-09-12 MED ORDER — CHLORHEXIDINE GLUCONATE 0.12 % MT SOLN
15.0000 mL | Freq: Once | OROMUCOSAL | Status: AC
  Administered 2022-09-12: 15 mL via OROMUCOSAL

## 2022-09-12 MED ORDER — PHENYLEPHRINE 80 MCG/ML (10ML) SYRINGE FOR IV PUSH (FOR BLOOD PRESSURE SUPPORT)
PREFILLED_SYRINGE | INTRAVENOUS | Status: DC | PRN
  Administered 2022-09-12 (×2): 80 ug via INTRAVENOUS
  Administered 2022-09-12: 160 ug via INTRAVENOUS
  Administered 2022-09-12: 80 ug via INTRAVENOUS
  Administered 2022-09-12: 160 ug via INTRAVENOUS
  Administered 2022-09-12: 80 ug via INTRAVENOUS

## 2022-09-12 MED ORDER — OXYCODONE HCL 5 MG/5ML PO SOLN: 5.0000 mg | Freq: Once | ORAL | Status: DC | PRN

## 2022-09-12 MED ORDER — PHENYLEPHRINE 80 MCG/ML (10ML) SYRINGE FOR IV PUSH (FOR BLOOD PRESSURE SUPPORT)
PREFILLED_SYRINGE | INTRAVENOUS | Status: AC
  Filled 2022-09-12: qty 10

## 2022-09-12 MED ORDER — FENTANYL CITRATE PF 50 MCG/ML IJ SOSY: 25.0000 ug | PREFILLED_SYRINGE | INTRAMUSCULAR | Status: DC | PRN

## 2022-09-12 MED ORDER — ONDANSETRON HCL 4 MG/2ML IJ SOLN
4.0000 mg | Freq: Once | INTRAMUSCULAR | Status: AC
  Administered 2022-09-12: 4 mg via INTRAVENOUS

## 2022-09-12 MED ORDER — CHLORHEXIDINE GLUCONATE 0.12 % MT SOLN: 15.0000 mL | Freq: Once | OROMUCOSAL | Status: DC

## 2022-09-12 MED ORDER — DEXAMETHASONE SODIUM PHOSPHATE 10 MG/ML IJ SOLN
INTRAMUSCULAR | Status: DC | PRN
  Administered 2022-09-12: 8 mg via INTRAVENOUS

## 2022-09-12 MED ORDER — CEFAZOLIN IN SODIUM CHLORIDE 3-0.9 GM/100ML-% IV SOLN
3.0000 g | INTRAVENOUS | Status: AC
  Administered 2022-09-12: 3 g via INTRAVENOUS
  Filled 2022-09-12: qty 100

## 2022-09-12 MED ORDER — ALBUTEROL SULFATE (2.5 MG/3ML) 0.083% IN NEBU
2.5000 mg | INHALATION_SOLUTION | Freq: Once | RESPIRATORY_TRACT | Status: AC
  Administered 2022-09-12: 2.5 mg via RESPIRATORY_TRACT

## 2022-09-12 MED ORDER — PROPOFOL 10 MG/ML IV BOLUS
INTRAVENOUS | Status: DC | PRN
Start: 2022-09-12 — End: 2022-09-12
  Administered 2022-09-12: 70 mg via INTRAVENOUS
  Administered 2022-09-12: 50 mg via INTRAVENOUS
  Administered 2022-09-12: 150 mg via INTRAVENOUS

## 2022-09-12 MED ORDER — ONDANSETRON HCL 4 MG/2ML IJ SOLN
INTRAMUSCULAR | Status: DC | PRN
  Administered 2022-09-12: 4 mg via INTRAVENOUS

## 2022-09-12 MED ORDER — ORAL CARE MOUTH RINSE: 15.0000 mL | Freq: Once | OROMUCOSAL | Status: AC

## 2022-09-12 MED ORDER — MIDAZOLAM HCL 2 MG/2ML IJ SOLN
INTRAMUSCULAR | Status: AC
  Filled 2022-09-12: qty 2

## 2022-09-12 MED ORDER — DOCUSATE SODIUM 100 MG PO CAPS: 100.0000 mg | ORAL_CAPSULE | Freq: Two times a day (BID) | ORAL | 0 refills | Status: AC

## 2022-09-12 MED ORDER — ALBUTEROL SULFATE (2.5 MG/3ML) 0.083% IN NEBU
INHALATION_SOLUTION | RESPIRATORY_TRACT | Status: AC
  Filled 2022-09-12: qty 3

## 2022-09-12 MED ORDER — ACETAMINOPHEN 500 MG PO TABS
1000.0000 mg | ORAL_TABLET | Freq: Once | ORAL | Status: AC
  Administered 2022-09-12: 500 mg via ORAL
  Filled 2022-09-12: qty 2

## 2022-09-12 SURGICAL SUPPLY — 28 items
APL SKNCLS STERI-STRIP NONHPOA (GAUZE/BANDAGES/DRESSINGS)
BAG URO CATCHER STRL LF (MISCELLANEOUS) ×2 IMPLANT
BASKET ZERO TIP NITINOL 2.4FR (BASKET) IMPLANT
BENZOIN TINCTURE PRP APPL 2/3 (GAUZE/BANDAGES/DRESSINGS) IMPLANT
BSKT STON RTRVL ZERO TP 2.4FR (BASKET) ×1
CATH URETERAL DUAL LUMEN 10F (MISCELLANEOUS) IMPLANT
CATH URETL OPEN 5X70 (CATHETERS) ×2 IMPLANT
CLOTH BEACON ORANGE TIMEOUT ST (SAFETY) ×2 IMPLANT
DRSG TEGADERM 2-3/8X2-3/4 SM (GAUZE/BANDAGES/DRESSINGS) IMPLANT
FIBER LASER MOSES 200 DFL (Laser) IMPLANT
FIBER LASER MOSES 365 DFL (Laser) IMPLANT
GLOVE BIOGEL M 7.0 STRL (GLOVE) ×2 IMPLANT
GOWN STRL REUS W/ TWL XL LVL3 (GOWN DISPOSABLE) ×2 IMPLANT
GOWN STRL REUS W/TWL XL LVL3 (GOWN DISPOSABLE) ×1
GUIDEWIRE STR DUAL SENSOR (WIRE) ×4 IMPLANT
GUIDEWIRE ZIPWRE .038 STRAIGHT (WIRE) IMPLANT
KIT TURNOVER KIT A (KITS) IMPLANT
LASER FIB FLEXIVA PULSE ID 365 (Laser) IMPLANT
MANIFOLD NEPTUNE II (INSTRUMENTS) ×2 IMPLANT
MAT PREVALON FULL STRYKER (MISCELLANEOUS) IMPLANT
PACK CYSTO (CUSTOM PROCEDURE TRAY) ×2 IMPLANT
SHEATH DILATOR SET 8/10 (MISCELLANEOUS) IMPLANT
SHEATH NAVIGATOR HD 12/14X36 (SHEATH) IMPLANT
STENT URET 6FRX24 CONTOUR (STENTS) IMPLANT
TRACTIP FLEXIVA PULS ID 200XHI (Laser) IMPLANT
TRACTIP FLEXIVA PULSE ID 200 (Laser)
TUBING CONNECTING 10 (TUBING) ×2 IMPLANT
TUBING UROLOGY SET (TUBING) ×2 IMPLANT

## 2022-09-12 NOTE — Op Note (Signed)
Operative Note  Preoperative diagnosis:  1.  Left ureteral and renal stones  Postoperative diagnosis: 1.  Same  Procedure(s): 1.  Cystoscopy 2. Left ureteroscopy with laser lithotripsy and basket extraction of stones 3. Left retrograde pyelogram 4. Left ureteral stent exchange 5. Fluoroscopy with intraoperative interpretation  Surgeon: Jettie Pagan, MD  Assistants:  None  Anesthesia:  General  Complications:  None  EBL:  Minimal  Specimens: 1. None  Drains/Catheters: 1.  Left 6Fr x 24cm ureteral stent without a tether string  Intraoperative findings:   Cystoscopy demonstrated no suspicious bladder lesions. Left ureteroscopy demonstrated 3 large stones within her left kidney, successfully dusted, fragmented and extracted. Successful left stent placement.  Indication:  [name] is a 62 y.o. female with left ureteral and renal stones here for definitive treatment.  Description of procedure: After informed consent was obtained from the patient, the patient was identified and taken to the operating room and placed in the supine position.  General anesthesia was administered as well as perioperative IV antibiotics.  At the beginning of the case, a time-out was performed to properly identify the patient, the surgery to be performed, and the surgical site.  Sequential compression devices were applied to the lower extremities at the beginning of the case for DVT prophylaxis.  The patient was then placed in the dorsal lithotomy supine position, prepped and draped in sterile fashion.  We then passed the 21-French rigid cystoscope through the urethra and into the bladder under vision without any difficulty, noting a normal urethra.  A systematic evaluation of the bladder revealed no evidence of any suspicious bladder lesions.  Ureteral orifices were in normal position.    The distal aspect of the ureteral stent was seen protruding from the left ureteral orifice.  We then used the  alligator-tooth forceps and grasped the distal end of the ureteral stent and brought it out the urethral meatus while watching the proximal coil straighten out nicely on fluoroscopy. Through the ureteral stent, we then passed a 0.038 sensor wire up to the level of the renal pelvis.  The ureteral stent was then removed, leaving the sensor wire up the left ureter.    Under cystoscopic and flouroscopic guidance, we cannulated the left ureteral orifice with a 5-French open-ended ureteral catheter and a gentle retrograde pyelogram was performed, revealing a normal caliber ureter without any filling defects. There was no hydronephrosis of the collecting system. A 0.038 sensor wire was then passed up to the level of the renal pelvis and secured to the drape as a safety wire. The ureteral catheter and cystoscope were removed, leaving the safety wire in place.   A semi-rigid ureteroscope was passed alongside the wire up the distal ureter which appeared normal. A second 0.038 sensor wire was passed under direct vision and the semirigid scope was removed. A 11/13Fr ureteral access sheath was carefully advanced up the ureter to the level of the UPJ over this wire under fluoroscopic guidance. The flexible ureteroscope was advanced into the collecting system via the access sheath. The collecting system was inspected. The calculus was identified at the renal pelvis and two large stones were seen in the lower poles. Using the 272 micron holmium laser fiber, the stones were dusted for approximately 50% and then fragmented completely. A 2.2 Fr zero tip basket was used to remove the fragments under visual guidance. These were sent for chemical analysis. With the ureteroscope in the kidney, a gentle pyelogram was performed to delineate the calyceal system  and we evaluated the calyces systematically. We encountered no further large stones. The rest of the stone fragments were very tiny and these were  irrigated away gently. The  calyces were re-inspected and there were no significant stone fragment residual.   We then withdrew the ureteroscope back down the ureter along with the access sheath, noting no evidence of any stones along the course of the ureter.  Prior to removing the ureteroscope, we did pass the Glidewire back up to the ureter to the renal pelvis.  Once the ureteroscope was removed, we then used the Glidewire under fluoroscopic guidance and passed up a 6-French x 26 cm double-pigtail ureteral stent up the ureter, making sure that the proximal and distal ends coiled within the kidney and bladder respectively.  Note that we did not leave a string attached.  The cystoscope was then advanced back into the bladder under vision.  We were able to see the distal stent coiling nicely within the bladder.  The bladder was then emptied with irrigation solution.  The cystoscope was then removed.    The patient tolerated the procedure well and there was no complication. Patient was awoken from anesthesia and taken to the recovery room in stable condition. I was present and scrubbed for the entirety of the case.  Plan:  Patient will be discharged home.  Follow up with me in 7 to 10 days for stent removal in the office.   Matt R.  MD [facility]  Pager: [phone]

## 2022-09-12 NOTE — Discharge Instructions (Addendum)
[facility] [phone] Post Ureteroscopy With or Without Stent Instructions  Definitions:  Ureter: The duct that transports urine from the kidney to the bladder. Stent:   A plastic hollow tube that is placed into the ureter, from the kidney to the bladder to prevent the ureter from swelling shut.  GENERAL INSTRUCTIONS:  Despite the fact that no skin incisions were used, the area around the ureter and bladder is raw and irritated. The stent is a foreign body which will further irritate the bladder wall. This irritation is manifested by increased frequency of urination, both day and night, and by an increase in the urge to urinate. In some, the urge to urinate is present almost always. Sometimes the urge is strong enough that you may not be able to stop yourself from urinating. The only real cure is to remove the stent and then give time for the bladder wall to heal which can't be done until the danger of the ureter swelling shut has passed, which varies.  You may see some blood in your urine while the stent is in place and a few days afterwards. Do not be alarmed, even if the urine was clear for a while. Get off your feet and drink lots of fluids until clearing occurs. If you start to pass clots or don't improve, call us.  DIET: You may return to your normal diet immediately. Because of the raw surface of your bladder, alcohol, spicy foods, acid type foods and drinks with caffeine may cause irritation or frequency and should be used in moderation. To keep your urine flowing freely and to avoid constipation, drink plenty of fluids during the day ( 8-10 glasses ). Tip: Avoid cranberry juice because it is very acidic.  ACTIVITY: Your physical activity doesn't need to be restricted. However, if you are very active, you may see some blood in your urine. We suggest that you reduce your activity under these circumstances until the bleeding has stopped.  BOWELS: It is important to  keep your bowels regular during the postoperative period. Straining with bowel movements can cause bleeding. A bowel movement every other day is reasonable. Use a mild laxative if needed, such as Milk of Magnesia 2-3 tablespoons, or 2 Dulcolax tablets. Call if you continue to have problems. If you have been taking narcotics for pain, before, during or after your surgery, you may be constipated. Take a laxative if necessary.   MEDICATION: You should resume your pre-surgery medications unless told not to. In addition you will often be given an antibiotic to prevent infection. These should be taken as prescribed until the bottles are finished unless you are having an unusual reaction to one of the drugs.  PROBLEMS YOU SHOULD REPORT TO [place]: Fevers over 100.5 Fahrenheit. Heavy bleeding, or clots ( See above notes about blood in urine ). Inability to urinate. Drug reactions ( hives, rash, nausea, vomiting, diarrhea ). Severe burning or pain with urination that is not improving.  FOLLOW-UP: You will need a follow-up appointment to monitor your progress. Call for this appointment at the number listed above. Usually the first appointment will be about three to fourteen days after your surgery.  The office will call with appointment to remove your stent.

## 2022-09-12 NOTE — Anesthesia Procedure Notes (Signed)
Procedure Name: Intubation Date/Time: 09/12/2022 2:15 PM  Performed by: Maurene Capes, CRNAPre-anesthesia Checklist: Patient identified, Emergency Drugs available, Suction available and Patient being monitored Patient Re-evaluated:Patient Re-evaluated prior to induction Oxygen Delivery Method: Circle System Utilized Preoxygenation: Pre-oxygenation with 100% oxygen Induction Type: IV induction Ventilation: Mask ventilation without difficulty Laryngoscope Size: Mac and 4 Grade View: Grade III Tube type: Oral Tube size: 7.5 mm Number of attempts: 1 Airway Equipment and Method: Oral airway and Rigid stylet Placement Confirmation: ETT inserted through vocal cords under direct vision, positive ETCO2 and breath sounds checked- equal and bilateral Secured at: 23 cm Tube secured with: Tape Dental Injury: Teeth and Oropharynx as per pre-operative assessment

## 2022-09-12 NOTE — Anesthesia Postprocedure Evaluation (Signed)
Anesthesia Post Note  Patient: [name]  Procedure(s) Performed: CYSTOSCOPY/LEFT RETROGRADE PYELOGRAM/LEFT URETEROSCOPY/HOLMIUM LASER/LEFT STENT EXCHANGE (Left: Ureter)     Patient location during evaluation: PACU Anesthesia Type: General Level of consciousness: awake and alert, patient cooperative and oriented Pain management: pain level controlled Vital Signs Assessment: post-procedure vital signs reviewed and stable Respiratory status: spontaneous breathing, nonlabored ventilation and respiratory function stable Cardiovascular status: blood pressure returned to baseline and stable Postop Assessment: no apparent nausea or vomiting Anesthetic complications: no   No notable events documented.  Last Vitals:  Vitals:   09/12/22 1532 09/12/22 1545  BP: (!) 141/88 (!) 143/89  Pulse: 91 88  Resp: 15 16  Temp: 36.4 C   SpO2: 93% 90%    Last Pain:  Vitals:   09/12/22 1532  PainSc: 0-No pain                 ,E. 

## 2022-09-12 NOTE — Transfer of Care (Signed)
Immediate Anesthesia Transfer of Care Note  Patient: [name]  Procedure(s) Performed: CYSTOSCOPY/LEFT RETROGRADE PYELOGRAM/LEFT URETEROSCOPY/HOLMIUM LASER/LEFT STENT EXCHANGE (Left: Ureter)  Patient Location: PACU  Anesthesia Type:General  Level of Consciousness: awake, alert , oriented, and patient cooperative  Airway & Oxygen Therapy: Patient Spontanous Breathing and Patient connected to nasal cannula oxygen  Post-op Assessment: Report given to RN and Post -op Vital signs reviewed and stable  Post vital signs: Reviewed and stable  Last Vitals:  Vitals Value Taken Time  BP    Temp    Pulse    Resp    SpO2      Last Pain: There were no vitals filed for this visit.       Complications: No notable events documented.

## 2022-09-12 NOTE — H&P (Signed)
Office Visit Report 08/21/2022    Sora Vrooman         MRN: [id number]  PRIMARY CARE:  Oliver Barre, MD    PRIMARY CARE FAX:  [phone]    REFERRING:  Oliver Barre, MD  DOB: 03-Sep-1960, 62 year old Female  PROVIDER:  Jettie Pagan, M.D.  SSN: [id number]  LOCATION:  [facility] [phone]    CC/HPI: [name] is a 62 year old female seen in follow-up with history of urolithiasis.   #1. Nephrolithiasis:  -S/p R URS/LL of right ureteral stone. Stone analysis: Uric acid 80%, calcium oxalate monohydrate 20%.  -She presented to the ED on 08/14/2022 with fevers, abdominal pain. CT A/P revealed 7 mm left proximal ureteral stone with hydronephrosis as well as large left lower pole stones measuring 12 mm and 7 mm. She underwent left stent placement on 08/14/2022.  -Urine culture 08/14/2022 with greater than 100,000 E. coli. She completed course of antibiotics.  -She denies abdominal pain, flank pain, fevers, chills.   She has been having some stent discomfort as well as urinary urgency.   #2. Bladder Wall - Diverticular Abscess - dome intramural bladder fluid collection with connection to sigmoid / diverticulosis tract by ER CT 07/2016 on eval dysuria and pelvic pain most c/w diverticular abscess. Deneis h/o colon CA / pelvic radiation. Had normal colonoscopy 07/2016. Managed with IR drain 07/2016, but she failed to follow up. Repeat CT 09/2016 with likely resolving area of prior inflammation. Cysto 09/2016 without fistula and UA completely normal. CT 03/2017 with continued improvement and likely just residual scarring along prior fistula tract. CT A/P 09/05/2021 with unchanged appearance of chronic colovaginal/colovesical fistula. Cystoscopy 09/05/2021 with residual scarring along prior fistula tract.  -CT 08/2022 revealed gas in the urinary bladder with collapsed bladder indistinguishable from pelvic small bowel loops, questioning persistent fistula. Cystoscopy with mild erythema on the posterior bladder  however no overt fistula.  -She denies fecalurea or pneumaturia.   PMH sig for morbid obesity, diverticulosis, vesicovaginal fistula closure alcohol abuse.     ALLERGIES: Aleve - Nausea Latex Playtex - Skin Rash Shrimp - Itching Tramadol - Other Reaction, confusion    MEDICATIONS: Metoprolol Succinate  Percocet 5 mg-325 mg tablet 1 tablet PO Q 4 H PRN  Albuterol Sulfate  Atorvastatin Calcium 20 mg tablet  Eliquis 5 mg tablet  Folic Acid  Hydrocodone-Acetaminophen 5 mg-325 mg tablet 1 tablet PO Q 4 H PRN  Losartan-Hydrochlorothiazide 100 mg-25 mg tablet  Oxycodone Hcl 5 mg tablet 1 tablet PO Daily  Robaxin      Notes: Pt stated thsat the oxycodone is making her nausea, doesn't want it to be an allergy. WAnts to know if she can be perscribed something else.   GU PSH: Cystoscopy - 2019, 2018 Locm 300-399Mg /Ml Iodine,1Ml - 2019 Ureteroscopic laser litho, Right - 09/30/2021      NON-GU PSH: Knee replacement, Bilateral - 2007           GU PMH: Ureteral calculus - 10/31/2021, - 09/25/2021 Gross hematuria - 2019 Dysuria (Worsening, Chronic), Culture urine. No ABX unless culture proven UTI. Urogesic-blue 81.6 mg 1 po TID PRN. F/U with Dr. Berneice Heinrich for 6 month cysto. If cysto shows no recurrence of fistula can than f/u PRN - 2019 Chronic cystitis (w/o hematuria) (Stable, Chronic) - 2018 Urinary incontinence, Unspec, Urinary incontinence - 2014       PMH Notes: .    NON-GU PMH: Fistula of intestine (Improving, Chronic) - 2018 Arrhythmia Arthritis Asthma  Atrial Fibrillation GERD Hypercholesterolemia Hypertension Pulmonary Embolism, History     FAMILY HISTORY: 1 son - Son Asthma - Runs in Family copd - Runs in Family Death In The Family Father - Father Death In The Family Mother - Mother Diabetes - Runs in Family stroke - Runs in Family    SOCIAL HISTORY: Marital Status: Married Preferred Language: English; Ethnicity: Not Hispanic Or Latino; Race: Black or African  American Current Smoking Status: Patient does not smoke anymore. Has not smoked since 02/10/1997.  <DIV'  Tobacco Use Assessment Completed:  Used Tobacco in last 30 days?   Does not use smokeless tobacco. Does not drink anymore.  Does not use drugs. Drinks 1 caffeinated drink per day. 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     REVIEW OF SYSTEMS:      GU Review Female:  Patient reports frequent urination and burning /pain with urination. Patient denies hard to postpone urination, get up at night to urinate, leakage of urine, stream starts and stops, trouble starting your stream, have to strain to urinate, and being pregnant.     Gastrointestinal (Upper):  Patient reports nausea. Patient denies vomiting and indigestion/ heartburn.     Gastrointestinal (Lower):  Patient denies diarrhea and constipation.     Constitutional:  Patient denies fever, night sweats, weight loss, and fatigue.     Skin:  Patient denies skin rash/ lesion and itching.     Eyes:  Patient denies blurred vision and double vision.     Ears/ Nose/ Throat:  Patient denies sore throat and sinus problems.     Hematologic/Lymphatic:  Patient denies swollen glands and easy bruising.     Cardiovascular:  Patient denies leg swelling and chest pains.     Respiratory:  Patient denies cough and shortness of breath.     Endocrine:  Patient denies excessive thirst.     Musculoskeletal:  Patient denies back pain and joint pain.     Neurological:  Patient denies dizziness and headaches.     Psychologic:  Patient denies depression and anxiety.     Notes: Pain at the end of urination      VITAL SIGNS: None      MULTI-SYSTEM PHYSICAL EXAMINATION:       Constitutional: Well-nourished. No physical deformities. Normally developed. Good grooming.      Respiratory: No labored breathing, no use of accessory muscles.       Cardiovascular: Normal temperature, normal extremity pulses, no swelling, no varicosities.      Gastrointestinal: No  mass, no tenderness, no rigidity, non obese abdomen.              Complexity of Data:   Source Of History:  Patient, Medical Record Summary  Records Review:  Previous Doctor Records, Previous Hospital Records, Previous Patient Records  Urine Test Review:  Urinalysis  X-Ray Review: C.T. Abdomen/Pelvis: Reviewed Films. Reviewed Report. Discussed With Patient.     PROCEDURES:    Visit Complexity - G2211      Urinalysis w/Scope  Dipstick Dipstick Cont'd Micro  Color: Yellow Bilirubin: Neg mg/dL WBC/hpf: 6 - 56/OZH  Appearance: Slightly Cloudy Ketones: Neg mg/dL RBC/hpf: 10 - 08/MVH  Specific Gravity: 1.015 Blood: 3+ ery/uL Bacteria: Mod (26-50/hpf)  pH: 6.0 Protein: Trace mg/dL Cystals: NS (Not Seen)  Glucose: Neg mg/dL Urobilinogen: 0.2 mg/dL Casts: NS (Not Seen)   Nitrites: Neg Trichomonas: Not Present   Leukocyte Esterase: 3+ leu/uL Mucous: Not Present    Epithelial Cells: 6 - 10/hpf  Yeast: NS (Not Seen)    Sperm: Not Present    ASSESSMENT:     ICD-10 Details  1 GU:  Ureteral calculus - N20.1   2  Chronic cystitis (w/o hematuria) - N30.20    PLAN:   Medications  New Meds: Myrbetriq 25 mg tablet, extended release 24 hr 1 tablet PO Daily #30 11 Refill(s)  Potassium Citrate Er 15 meq (1,620 mg) tablet, extended release 1 tablet PO BID #60 11 Refill(s)     Pharmacy Name:  [facility] [phone]    Address:  [address]     [place], [place] [address]    Phone:  [phone]    Fax:  [phone]   Orders  Labs Urine Culture  Document  Letter(s):  Created for Patient: Clinical Summary   Notes:   #1. Urolithiasis:  -S/p R URS/LL of right ureteral stone. Stone analysis: Uric acid 80%, calcium oxalate monohydrate 20%.  -S/p left stent for obstructing proximal 7 mm left ureteral stone as well as large left lower pole stones. She is seen and is scheduled for left ureteroscopy with laser lithotripsy on 09/12/2022. Will plan to treat her left lower pole stones.  Discussed that this may require staged procedure.  -Will send urine for culture today to ensure no infection prior to surgery.  -Will have her start potassium citrate given uric acid stone.  -Will send in oxybutynin for urinary urgency   We discussed dietary methods for stone prevention including the following: increased water intake to 2-3 liters per day, add lemon or lemon concentrate to water to increase citrate which is beneficial for stone prevention, limiting dietary sodium to less than 2000 mg per day, limiting animal protein to less than 2 servings (16 ounces/day), and limiting foods high in oxalate content (spinach, beans, chocolate, etc.).   #2. Bladder wall/diverticular abscess: She has residual scarring related to fistula in 2018. Well-healed fistula by cystoscopy. She denies voiding symptoms. She will follow-up as needed if she develops signs or symptoms of recurrent diverticular abscess/fistula.   CC: Oliver Barre, MD   Next Appointment:    Next Appointment: 09/12/2022 01:15 PM   Appointment Type: Surgery    Location: [facility] [phone]   Provider: Jettie Pagan, M.D.   Reason for Visit: [facility]/OP CYSTO, (L) RPG, (L) URS, HLL, (L) STENT EXCHANGE    Signed by Jettie Pagan, M.D. on 08/21/22 at 10:31 AM (EDT)  Urology Preoperative H&P   Chief Complaint: Left ureteral and renal stones  History of Present Illness: [name] is a 62 y.o. female with left ureteral and renal stones here for cysto, L RPG, L URS/LL, left stent exchange. Denies fevers, chills, dysuria. Ucx 08/21/2022 NG. She additionally is taking peri-operative cipro.     Past Medical History:  Diagnosis Date   Abscess of bladder 07/28/2016   Alcohol dependence (HCC)    Allergic rhinitis 12/06/2013   Anxiety    Asthma 03/16/2015   Atrial flutter with rapid ventricular response (HCC) 04/01/2020   COLONIC POLYPS, HX OF 04/05/2010   DIVERTICULITIS, HX OF 04/05/2010   DJD (degenerative joint  disease)    right knee, mot to severe   Dysrhythmia    GERD (gastroesophageal reflux disease)    no meds   Heart murmur    hx of    History of kidney stones    Hyperlipidemia    Hypertension    Impaired glucose tolerance 12/06/2013   Morbid obesity with BMI of 50.0-59.9,  adult [facility])    Nausea & vomiting 05/24/2022   Peripheral vascular disease (HCC)    Pulmonary embolism (HCC)     Past Surgical History:  Procedure Laterality Date   ABDOMINAL HYSTERECTOMY  age 42   fibroids   BREAST BIOPSY Left    COLONOSCOPY WITH PROPOFOL N/A 07/25/2016   Procedure: COLONOSCOPY WITH PROPOFOL;  Surgeon: Jeani Hawking, MD;  Location: [facility];  Service: Endoscopy;  Laterality: N/A;   colonscopy     x 2   CYSTOSCOPY W/ URETERAL STENT PLACEMENT Right 09/05/2021   Procedure: CYSTOSCOPY WITH RETROGRADE PYELOGRAM/URETERAL STENT PLACEMENT;  Surgeon: [name], MD;  Location: [facility];  Service: Urology;  Laterality: Right;   CYSTOSCOPY WITH RETROGRADE PYELOGRAM, URETEROSCOPY AND STENT PLACEMENT Left 08/14/2022   Procedure: CYSTOSCOPY WITH RETROGRADE PYELOGRAM;  Surgeon: [name], MD;  Location: [facility] OR;  Service: Urology;  Laterality: Left;   CYSTOSCOPY WITH URETEROSCOPY AND STENT PLACEMENT Left 08/14/2022   Procedure: URETEROSCOPY AND STENT PLACEMENT;  Surgeon: [name], MD;  Location: [facility] OR;  Service: Urology;  Laterality: Left;   CYSTOSCOPY/URETEROSCOPY/HOLMIUM LASER/STENT PLACEMENT Right 09/30/2021   Procedure: CYSTOSCOPY/ RETROGRADE/URETEROSCOPY/HOLMIUM LASER/STENT PLACEMENT;  Surgeon: [name], MD;  Location: [facility];  Service: Urology;  Laterality: Right;   IR RADIOLOGIST EVAL & MGMT  08/12/2016   IR RADIOLOGIST EVAL & MGMT  08/21/2016   KNEE ARTHROSCOPY     left    TOTAL KNEE ARTHROPLASTY  07/29/2011   Procedure: TOTAL KNEE ARTHROPLASTY;  Surgeon: Shelda Pal, MD;  Location: [facility];  Service: Orthopedics;  Laterality: Right;   TOTAL KNEE ARTHROPLASTY Left 12/14/2012    Procedure: LEFT TOTAL KNEE ARTHROPLASTY;  Surgeon: Shelda Pal, MD;  Location: [facility];  Service: Orthopedics;  Laterality: Left;    Allergies:  Allergies  Allergen Reactions   Morphine Itching   Shrimp [Shellfish Allergy] Itching and Other (See Comments)    Tongue burns also   Tramadol Other (See Comments)    Caused confusion   Covid-19 (Mrna) Vaccine Hives   Diltiazem Hcl Itching    Pt with itching of the feet when bolus given   Latex Rash    Family History  Problem Relation Age of Onset   Stroke Mother    COPD Father    Lymphoma Sister    Alcoholism Sister     Social History:  reports that she quit smoking about 39 years ago. Her smoking use included cigarettes. She started smoking about 46 years ago. She has a 3.5 pack-year smoking history. She has never used smokeless tobacco. She reports that she does not currently use alcohol after a past usage of about 1.0 standard drink of alcohol per week. She reports that she does not currently use drugs after having used the following drugs: Marijuana.  ROS: A complete review of systems was performed.  All systems are negative except for pertinent findings as noted.  Physical Exam:  Vital signs in last 24 hours: Weight:  [133.8 kg] 133.8 kg (08/02 1116) Constitutional:  Alert and oriented, No acute distress Cardiovascular: Regular rate and rhythm Respiratory: Normal respiratory effort, Lungs clear bilaterally GI: Abdomen is soft, nontender, nondistended, no abdominal masses GU: No CVA tenderness Lymphatic: No lymphadenopathy Neurologic: Grossly intact, no focal deficits Psychiatric: Normal mood and affect  Laboratory Data:  Recent Labs    09/10/22 1348  WBC 11.0*  HGB 13.8  HCT 44.3  PLT 246    Recent Labs    09/10/22 1348  NA 142  K 4.2  CL 104  GLUCOSE 99  BUN 26*  CALCIUM 9.2  CREATININE 1.18*     No results found for this or any previous visit (from the past 24 hour(s)). No results found for this or  any previous visit (from the past 240 hour(s)).  Renal Function: Recent Labs    09/10/22 1348  CREATININE 1.18*   Estimated Creatinine Clearance: 65 mL/min (A) (by C-G formula based on SCr of 1.18 mg/dL (H)).  Radiologic Imaging: No results found.  I independently reviewed the above imaging studies.  Assessment and Plan DELCIE RUPPERT is a 62 y.o. female with left ureteral and renal stones here for cysto, L RPG, L URS/LL, left stent exchange   -The risks, benefits and alternatives of above was discussed with the patient.  Risks include, but are not limited to: bleeding, urinary tract infection, ureteral injury, ureteral stricture disease, chronic pain, urinary symptoms, bladder injury, stent migration, the need for nephrostomy tube placement, MI, CVA, DVT, PE and the inherent risks with general anesthesia.  The patient voices understanding and wishes to proceed.    Matt R.  MD 09/12/2022, 1:20 PM  [facility] Pager: [phone]): [phone]

## 2022-09-13 ENCOUNTER — Encounter (HOSPITAL_COMMUNITY): Payer: Self-pay | Admitting: Urology

## 2022-09-15 ENCOUNTER — Other Ambulatory Visit: Payer: Self-pay | Admitting: Internal Medicine

## 2022-09-15 DIAGNOSIS — N201 Calculus of ureter: Secondary | ICD-10-CM | POA: Diagnosis not present

## 2022-09-24 DIAGNOSIS — N202 Calculus of kidney with calculus of ureter: Secondary | ICD-10-CM | POA: Diagnosis not present

## 2022-09-24 DIAGNOSIS — N3020 Other chronic cystitis without hematuria: Secondary | ICD-10-CM | POA: Diagnosis not present

## 2022-09-24 DIAGNOSIS — N200 Calculus of kidney: Secondary | ICD-10-CM | POA: Diagnosis not present

## 2022-09-30 DIAGNOSIS — N3020 Other chronic cystitis without hematuria: Secondary | ICD-10-CM | POA: Diagnosis not present

## 2022-09-30 DIAGNOSIS — N200 Calculus of kidney: Secondary | ICD-10-CM | POA: Diagnosis not present

## 2022-10-04 ENCOUNTER — Other Ambulatory Visit: Payer: Self-pay | Admitting: Internal Medicine

## 2022-10-06 ENCOUNTER — Telehealth: Payer: Self-pay | Admitting: Internal Medicine

## 2022-10-06 NOTE — Telephone Encounter (Signed)
Patient wants to know why her albuterol was refused.  Please call patient at: [phone]

## 2022-10-06 NOTE — Telephone Encounter (Signed)
Patient called back and said she doesn't have her albuterol anymore because she lost the inhaler. She would like to know if it can still get refilled. Best callback is [phone].

## 2022-10-06 NOTE — Telephone Encounter (Signed)
Called pt no answer LMOM MD left MD msg of [facility].Marland KitchenShearon Stalls

## 2022-10-07 ENCOUNTER — Other Ambulatory Visit: Payer: Self-pay

## 2022-10-07 MED ORDER — ALBUTEROL SULFATE HFA 108 (90 BASE) MCG/ACT IN AERS: INHALATION_SPRAY | RESPIRATORY_TRACT | 1 refills | Status: DC

## 2022-10-07 NOTE — Telephone Encounter (Signed)
Called and left voicemail letting Pt know the refill has been sent in.

## 2022-10-09 ENCOUNTER — Other Ambulatory Visit: Payer: Self-pay | Admitting: Internal Medicine

## 2022-10-09 ENCOUNTER — Other Ambulatory Visit: Payer: Self-pay

## 2022-10-10 ENCOUNTER — Other Ambulatory Visit (HOSPITAL_COMMUNITY): Payer: Self-pay | Admitting: *Deleted

## 2022-10-10 MED ORDER — METOPROLOL SUCCINATE ER 25 MG PO TB24: 25.0000 mg | ORAL_TABLET | Freq: Every day | ORAL | 0 refills | Status: DC

## 2022-10-12 DIAGNOSIS — Z419 Encounter for procedure for purposes other than remedying health state, unspecified: Secondary | ICD-10-CM | POA: Diagnosis not present

## 2022-10-24 ENCOUNTER — Ambulatory Visit (HOSPITAL_COMMUNITY): Payer: 59 | Admitting: Internal Medicine

## 2022-10-28 NOTE — Progress Notes (Signed)
treated

## 2022-11-05 ENCOUNTER — Other Ambulatory Visit: Payer: Self-pay | Admitting: Nurse Practitioner

## 2022-11-05 DIAGNOSIS — K219 Gastro-esophageal reflux disease without esophagitis: Secondary | ICD-10-CM

## 2022-11-11 DIAGNOSIS — Z419 Encounter for procedure for purposes other than remedying health state, unspecified: Secondary | ICD-10-CM | POA: Diagnosis not present

## 2022-11-21 ENCOUNTER — Ambulatory Visit (HOSPITAL_COMMUNITY): Payer: 59 | Admitting: Internal Medicine

## 2022-12-01 ENCOUNTER — Other Ambulatory Visit (HOSPITAL_COMMUNITY): Payer: Self-pay | Admitting: *Deleted

## 2022-12-01 MED ORDER — METOPROLOL SUCCINATE ER 25 MG PO TB24: 25.0000 mg | ORAL_TABLET | Freq: Every day | ORAL | 0 refills | Status: DC

## 2022-12-03 ENCOUNTER — Encounter: Payer: Self-pay | Admitting: Internal Medicine

## 2022-12-03 ENCOUNTER — Ambulatory Visit (INDEPENDENT_AMBULATORY_CARE_PROVIDER_SITE_OTHER): Payer: 59 | Admitting: Internal Medicine

## 2022-12-03 VITALS — BP 124/78 | HR 85 | Temp 98.5°F | Ht 61.0 in | Wt 298.0 lb

## 2022-12-03 DIAGNOSIS — F411 Generalized anxiety disorder: Secondary | ICD-10-CM | POA: Diagnosis not present

## 2022-12-03 DIAGNOSIS — I10 Essential (primary) hypertension: Secondary | ICD-10-CM

## 2022-12-03 DIAGNOSIS — J309 Allergic rhinitis, unspecified: Secondary | ICD-10-CM | POA: Diagnosis not present

## 2022-12-03 DIAGNOSIS — E7800 Pure hypercholesterolemia, unspecified: Secondary | ICD-10-CM

## 2022-12-03 DIAGNOSIS — R7302 Impaired glucose tolerance (oral): Secondary | ICD-10-CM | POA: Diagnosis not present

## 2022-12-03 DIAGNOSIS — E559 Vitamin D deficiency, unspecified: Secondary | ICD-10-CM

## 2022-12-03 LAB — BASIC METABOLIC PANEL
BUN: 21 mg/dL (ref 6–23)
CO2: 32 meq/L (ref 19–32)
Calcium: 9.5 mg/dL (ref 8.4–10.5)
Chloride: 103 meq/L (ref 96–112)
Creatinine, Ser: 1.24 mg/dL — ABNORMAL HIGH (ref 0.40–1.20)
GFR: 46.91 mL/min — ABNORMAL LOW (ref >60.00)
Glucose, Bld: 111 mg/dL — ABNORMAL HIGH (ref 70–99)
Potassium: 3.5 meq/L (ref 3.5–5.1)
Sodium: 142 meq/L (ref 135–145)

## 2022-12-03 LAB — CBC WITH DIFFERENTIAL/PLATELET
Basophils Absolute: 0.1 10*3/uL (ref 0.0–0.1)
Basophils Relative: 0.5 % (ref 0.0–3.0)
Eosinophils Absolute: 0.2 10*3/uL (ref 0.0–0.7)
Eosinophils Relative: 2.1 % (ref 0.0–5.0)
HCT: 42.3 % (ref 36.0–46.0)
Hemoglobin: 13.1 g/dL (ref 12.0–15.0)
Lymphocytes Relative: 16.4 % (ref 12.0–46.0)
Lymphs Abs: 1.8 10*3/uL (ref 0.7–4.0)
MCHC: 30.8 g/dL (ref 30.0–36.0)
MCV: 95.3 fL (ref 78.0–100.0)
Monocytes Absolute: 1.1 10*3/uL — ABNORMAL HIGH (ref 0.1–1.0)
Monocytes Relative: 9.7 % (ref 3.0–12.0)
Neutro Abs: 8.1 10*3/uL — ABNORMAL HIGH (ref 1.4–7.7)
Neutrophils Relative %: 71.3 % (ref 43.0–77.0)
Platelets: 248 10*3/uL (ref 150.0–400.0)
RBC: 4.44 Mil/uL (ref 3.87–5.11)
RDW: 14.9 % (ref 11.5–15.5)
WBC: 11.3 10*3/uL — ABNORMAL HIGH (ref 4.0–10.5)

## 2022-12-03 LAB — LIPID PANEL
Cholesterol: 171 mg/dL (ref 0–200)
HDL: 56.9 mg/dL (ref >39.00)
LDL Cholesterol: 99 mg/dL (ref 0–99)
NonHDL: 114.58
Total CHOL/HDL Ratio: 3
Triglycerides: 78 mg/dL (ref 0.0–149.0)
VLDL: 15.6 mg/dL (ref 0.0–40.0)

## 2022-12-03 LAB — HEPATIC FUNCTION PANEL
ALT: 42 U/L — ABNORMAL HIGH (ref 0–35)
AST: 48 U/L — ABNORMAL HIGH (ref 0–37)
Albumin: 3.8 g/dL (ref 3.5–5.2)
Alkaline Phosphatase: 103 U/L (ref 39–117)
Bilirubin, Direct: 0.1 mg/dL (ref 0.0–0.3)
Total Bilirubin: 0.4 mg/dL (ref 0.2–1.2)
Total Protein: 7.4 g/dL (ref 6.0–8.3)

## 2022-12-03 LAB — MICROALBUMIN / CREATININE URINE RATIO
Creatinine,U: 146.8 mg/dL
Microalb Creat Ratio: 3.6 mg/g (ref 0.0–30.0)
Microalb, Ur: 5.3 mg/dL — ABNORMAL HIGH (ref 0.0–1.9)

## 2022-12-03 LAB — TSH: TSH: 2.64 u[IU]/mL (ref 0.35–5.50)

## 2022-12-03 LAB — VITAMIN D 25 HYDROXY (VIT D DEFICIENCY, FRACTURES): VITD: 11.47 ng/mL — ABNORMAL LOW (ref 30.00–100.00)

## 2022-12-03 MED ORDER — ALBUTEROL SULFATE HFA 108 (90 BASE) MCG/ACT IN AERS: INHALATION_SPRAY | RESPIRATORY_TRACT | 1 refills | Status: DC

## 2022-12-03 MED ORDER — LORAZEPAM 0.5 MG PO TABS: 0.5000 mg | ORAL_TABLET | Freq: Two times a day (BID) | ORAL | 2 refills | Status: DC | PRN

## 2022-12-03 NOTE — Progress Notes (Signed)
Patient ID: [name], female   DOB: 09/28/1960, 62 y.o.   MRN: [id number]        Chief Complaint: follow up anxiety, depression, alcohol abuse, hyperglycemia, low vit d, hld       HPI:  [name] is a 62 y.o. female here overall doing ok, Denies worsening depressive symptoms, suicidal ideation, or panic; has ongoing anxiety, not increased recently, asking for lorazepam renewal.  No ETOH in over 1 wk, and does not plan to restart.  Pt denies chest pain, increased sob or doe, wheezing, orthopnea, PND, increased LE swelling, palpitations, dizziness or syncope.   Pt denies polydipsia, polyuria, or new focal neuro s/s.    Pt denies fever, wt loss, night sweats, loss of appetite, or other constitutional symptoms  Declines need for SSRI or other tx at this time. Peak wt has been about 337 in past.  Does have several wks ongoing nasal allergy symptoms with clearish congestion, itch and sneezing, without fever, pain, ST, cough, swelling or wheezing. Wt Readings from Last 3 Encounters:  12/03/22 298 lb (135.2 kg)  09/12/22 295 lb (133.8 kg)  09/10/22 295 lb (133.8 kg)   BP Readings from Last 3 Encounters:  12/03/22 124/78  09/12/22 (!) 146/93  09/10/22 135/87         Past Medical History:  Diagnosis Date   Abscess of bladder 07/28/2016   Alcohol dependence (HCC)    Allergic rhinitis 12/06/2013   Anxiety    Asthma 03/16/2015   Atrial flutter with rapid ventricular response (HCC) 04/01/2020   COLONIC POLYPS, HX OF 04/05/2010   DIVERTICULITIS, HX OF 04/05/2010   DJD (degenerative joint disease)    right knee, mot to severe   Dysrhythmia    GERD (gastroesophageal reflux disease)    no meds   Heart murmur    hx of    History of kidney stones    Hyperlipidemia    Hypertension    Impaired glucose tolerance 12/06/2013   Morbid obesity with BMI of 50.0-59.9, adult (HCC)    Nausea & vomiting 05/24/2022   Peripheral vascular disease (HCC)    Pulmonary embolism (HCC)    Past Surgical  History:  Procedure Laterality Date   ABDOMINAL HYSTERECTOMY  age 89   fibroids   BREAST BIOPSY Left    COLONOSCOPY WITH PROPOFOL N/A 07/25/2016   Procedure: COLONOSCOPY WITH PROPOFOL;  Surgeon: Jeani Hawking, MD;  Location: [facility];  Service: Endoscopy;  Laterality: N/A;   colonscopy     x 2   CYSTOSCOPY W/ URETERAL STENT PLACEMENT Right 09/05/2021   Procedure: CYSTOSCOPY WITH RETROGRADE PYELOGRAM/URETERAL STENT PLACEMENT;  Surgeon: [name], MD;  Location: [facility];  Service: Urology;  Laterality: Right;   CYSTOSCOPY WITH RETROGRADE PYELOGRAM, URETEROSCOPY AND STENT PLACEMENT Left 08/14/2022   Procedure: CYSTOSCOPY WITH RETROGRADE PYELOGRAM;  Surgeon: [name], MD;  Location: [facility] OR;  Service: Urology;  Laterality: Left;   CYSTOSCOPY WITH URETEROSCOPY AND STENT PLACEMENT Left 08/14/2022   Procedure: URETEROSCOPY AND STENT PLACEMENT;  Surgeon: [name], MD;  [facility];  Service: Urology;  Laterality: Left;   CYSTOSCOPY/URETEROSCOPY/HOLMIUM LASER/STENT PLACEMENT Right 09/30/2021   Procedure: CYSTOSCOPY/ RETROGRADE/URETEROSCOPY/HOLMIUM LASER/STENT PLACEMENT;  Surgeon: [name], MD;  [facility];  Service: Urology;  Laterality: Right;   CYSTOSCOPY/URETEROSCOPY/HOLMIUM LASER/STENT PLACEMENT Left 09/12/2022   Procedure: CYSTOSCOPY/LEFT RETROGRADE PYELOGRAM/LEFT URETEROSCOPY/HOLMIUM LASER/LEFT STENT EXCHANGE;  Surgeon: [name], MD;  Location: [facility];  Service: Urology;  Laterality: Left;  75  MINUTES NEEDED FOR CASE   IR RADIOLOGIST EVAL & MGMT  08/12/2016   IR RADIOLOGIST EVAL & MGMT  08/21/2016   KNEE ARTHROSCOPY     left    TOTAL KNEE ARTHROPLASTY  07/29/2011   Procedure: TOTAL KNEE ARTHROPLASTY;  Surgeon: Shelda Pal, MD;  Location: [facility];  Service: Orthopedics;  Laterality: Right;   TOTAL KNEE ARTHROPLASTY Left 12/14/2012   Procedure: LEFT TOTAL KNEE ARTHROPLASTY;  Surgeon: Shelda Pal, MD;  Location: [facility];  Service: Orthopedics;  Laterality:  Left;    reports that she quit smoking about 39 years ago. Her smoking use included cigarettes. She started smoking about 46 years ago. She has a 3.5 pack-year smoking history. She has never used smokeless tobacco. She reports that she does not currently use alcohol after a past usage of about 1.0 standard drink of alcohol per week. She reports that she does not currently use drugs after having used the following drugs: Marijuana. family history includes Alcoholism in her sister; COPD in her father; Lymphoma in her sister; Stroke in her mother. Allergies  Allergen Reactions   Morphine Itching   Shrimp [Shellfish Allergy] Itching and Other (See Comments)    Tongue burns also   Tramadol Other (See Comments)    Caused confusion   Covid-19 (Mrna) Vaccine Hives   Diltiazem Hcl Itching    Pt with itching of the feet when bolus given   Latex Rash   Current Outpatient Medications on File Prior to Visit  Medication Sig Dispense Refill   atorvastatin (LIPITOR) 20 MG tablet TAKE 1 TABLET BY MOUTH EVERY DAY 30 tablet 11   Brimonidine Tartrate (LUMIFY) 0.025 % SOLN Place 1 drop into both eyes daily as needed (redness).     calcium carbonate (TUMS - DOSED IN MG ELEMENTAL CALCIUM) 500 MG chewable tablet Chew 1 tablet by mouth daily as needed for indigestion or heartburn.     ELIQUIS 5 MG TABS tablet Take 5 mg by mouth 2 (two) times daily.     famotidine (PEPCID) 20 MG tablet TAKE 1 TABLET BY MOUTH TWICE A DAY 60 tablet 5   losartan-hydrochlorothiazide (HYZAAR) 100-25 MG tablet Take 1 tablet by mouth daily. 30 tablet 8   metoprolol succinate (TOPROL-XL) 25 MG 24 hr tablet Take 1 tablet (25 mg total) by mouth daily. Appointment Required For Further Refills [phone] 30 tablet 0   mometasone-formoterol (DULERA) 100-5 MCG/ACT AERO Inhale 2 puffs into the lungs in the morning and at bedtime. 1 each 5   oxyCODONE (OXY IR/ROXICODONE) 5 MG immediate release tablet Take 1 tablet (5 mg total) by mouth every 3  (three) hours as needed for up to 12 doses for moderate pain. 12 tablet 0   pantoprazole (PROTONIX) 40 MG tablet Take 1 tablet (40 mg total) by mouth daily. 30 tablet 2   Potassium Citrate 15 MEQ (1620 MG) TBCR Take 1 tablet by mouth 2 (two) times daily as needed (dehydration).     triamcinolone cream (KENALOG) 0.1 % APPLY 1 APPLICATION TOPICALLY 2 (TWO) TIMES DAILY AS NEEDED (ITCHING). 30 g 2   acetaminophen (TYLENOL) 500 MG tablet Take 1 tablet (500 mg total) by mouth every 6 (six) hours as needed for mild pain, fever or headache. (Patient not taking: Reported on 12/03/2022) 30 tablet 0   No current facility-administered medications on file prior to visit.        ROS:  All others reviewed and negative.  Objective  PE:  BP 124/78 (BP Location: Right Arm, Patient Position: Sitting, Cuff Size: Normal)   Pulse 85   Temp 98.5 F (36.9 C) (Oral)   Ht 5\' 1"  (1.549 m)   Wt 298 lb (135.2 kg)   SpO2 96%   BMI 56.31 kg/m                 Constitutional: Pt appears in NAD               HENT: Head: NCAT.                Right Ear: External ear normal.                 Left Ear: External ear normal.                Eyes: . Pupils are equal, round, and reactive to light. Conjunctivae and EOM are normal               Nose: without d/c or deformity               Neck: Neck supple. Gross normal ROM               Cardiovascular: Normal rate and regular rhythm.                 Pulmonary/Chest: Effort normal and breath sounds without rales or wheezing.                Abd:  Soft, NT, ND, + BS, no organomegaly               Neurological: Pt is alert. At baseline orientation, motor grossly intact               Skin: Skin is warm. No rashes, no other new lesions, LE edema - none               Psychiatric: Pt behavior is normal without agitation , depressed nervous affect  Micro: none  Cardiac tracings I have personally interpreted today:  none  Pertinent Radiological findings (summarize): none    Lab Results  Component Value Date   WBC 11.3 (H) 12/03/2022   HGB 13.1 12/03/2022   HCT 42.3 12/03/2022   PLT 248.0 12/03/2022   GLUCOSE 111 (H) 12/03/2022   CHOL 171 12/03/2022   TRIG 78.0 12/03/2022   HDL 56.90 12/03/2022   LDLDIRECT 107.0 02/23/2012   LDLCALC 99 12/03/2022   ALT 42 (H) 12/03/2022   AST 48 (H) 12/03/2022   NA 142 12/03/2022   K 3.5 12/03/2022   CL 103 12/03/2022   CREATININE 1.24 (H) 12/03/2022   BUN 21 12/03/2022   CO2 32 12/03/2022   TSH 2.64 12/03/2022   INR 1.1 08/14/2022   HGBA1C 6.3 12/03/2022   MICROALBUR 5.3 (H) 12/03/2022   Assessment/Plan:  [name] is a 62 y.o. Black or African American [2] female with  has a past medical history of Abscess of bladder (07/28/2016), Alcohol dependence (HCC), Allergic rhinitis (12/06/2013), Anxiety, Asthma (03/16/2015), Atrial flutter with rapid ventricular response (HCC) (04/01/2020), COLONIC POLYPS, HX OF (04/05/2010), DIVERTICULITIS, HX OF (04/05/2010), DJD (degenerative joint disease), Dysrhythmia, GERD (gastroesophageal reflux disease), Heart murmur, History of kidney stones, Hyperlipidemia, Hypertension, Impaired glucose tolerance (12/06/2013), Morbid obesity with BMI of 50.0-59.9, adult (HCC), Nausea & vomiting (05/24/2022), Peripheral vascular disease (HCC), and Pulmonary embolism (HCC).  Vitamin D deficiency Last vitamin D Lab Results  Component Value Date   VD25OH  11.47 (L) 12/03/2022   Low, to start oral replacement   Impaired glucose tolerance Lab Results  Component Value Date   HGBA1C 6.3 12/03/2022   Stable, pt to continue current medical treatment  - diet, wt control   Hyperlipidemia Lab Results  Component Value Date   LDLCALC 99 12/03/2022   Stable, pt to continue current statin lipitor 20 mg qd   Essential hypertension BP Readings from Last 3 Encounters:  12/03/22 124/78  09/12/22 (!) 146/93  09/10/22 135/87   Stable, pt to continue medical treatment hyzaar 100 - 25  every day, toprol xl 25 qd   Allergic rhinitis Also for otc nasacort asd  Anxiety state Ok for lorazepam refill  Followup: Return in about 6 months (around 06/03/2023).  Oliver Barre, MD 12/06/2022 7:54 PM [facility]  Primary Care - [facility]

## 2022-12-04 LAB — HEMOGLOBIN A1C: Hgb A1c MFr Bld: 6.3 % (ref 4.6–6.5)

## 2022-12-04 LAB — URINALYSIS, ROUTINE W REFLEX MICROSCOPIC
Bilirubin Urine: NEGATIVE
Ketones, ur: NEGATIVE
Nitrite: POSITIVE — AB
Specific Gravity, Urine: 1.02 (ref 1.000–1.030)
Total Protein, Urine: NEGATIVE
Urine Glucose: NEGATIVE
Urobilinogen, UA: 0.2 (ref 0.0–1.0)
pH: 6 (ref 5.0–8.0)

## 2022-12-04 NOTE — Progress Notes (Signed)
The test results show that your current treatment is OK, as the tests are stable.  Please continue the same plan.  There is no other need for change of treatment or further evaluation based on these results, at this time.  thanks 

## 2022-12-05 ENCOUNTER — Telehealth: Payer: Self-pay | Admitting: Internal Medicine

## 2022-12-05 ENCOUNTER — Other Ambulatory Visit: Payer: Self-pay

## 2022-12-05 MED ORDER — ONDANSETRON 4 MG PO TBDP: 4.0000 mg | ORAL_TABLET | Freq: Three times a day (TID) | ORAL | 2 refills | Status: DC | PRN

## 2022-12-05 MED ORDER — ALBUTEROL SULFATE HFA 108 (90 BASE) MCG/ACT IN AERS: INHALATION_SPRAY | RESPIRATORY_TRACT | 5 refills | Status: DC

## 2022-12-05 NOTE — Telephone Encounter (Signed)
Called and addressed Pt concerns.

## 2022-12-05 NOTE — Telephone Encounter (Signed)
Prescription Request  12/05/2022  LOV: 12/03/2022  What is the name of the medication or equipment? ondansetron (ZOFRAN-ODT) 4 MG disintegrating tablet   Have you contacted your pharmacy to request a refill? No   Which pharmacy would you like this sent to?  [id number] - [place], [place] - [address] AT [facility] [address] [place] [address] Phone: [phone] Fax: [phone]    Patient notified that their request is being sent to the clinical staff for review and that they should receive a response within 2 business days.   Please advise at Mobile [phone] (mobile)

## 2022-12-05 NOTE — Telephone Encounter (Signed)
Pt called wanting to speak with the Nurse about her medication and has a couple of questions. Please advise.

## 2022-12-05 NOTE — Telephone Encounter (Signed)
Refill sent.

## 2022-12-06 ENCOUNTER — Encounter: Payer: Self-pay | Admitting: Internal Medicine

## 2022-12-06 NOTE — Assessment & Plan Note (Signed)
Lab Results  Component Value Date   HGBA1C 6.3 12/03/2022   Stable, pt to continue current medical treatment  - diet, wt control

## 2022-12-06 NOTE — Assessment & Plan Note (Signed)
Also for otc nasacort asd

## 2022-12-06 NOTE — Assessment & Plan Note (Signed)
BP Readings from Last 3 Encounters:  12/03/22 124/78  09/12/22 (!) 146/93  09/10/22 135/87   Stable, pt to continue medical treatment hyzaar 100 - 25 every day, toprol xl 25 qd

## 2022-12-06 NOTE — Assessment & Plan Note (Signed)
Ok for lorazepam refill

## 2022-12-06 NOTE — Assessment & Plan Note (Signed)
Last vitamin D Lab Results  Component Value Date   VD25OH 11.47 (L) 12/03/2022   Low, to start oral replacement

## 2022-12-06 NOTE — Assessment & Plan Note (Signed)
Lab Results  Component Value Date   LDLCALC 99 12/03/2022   Stable, pt to continue current statin lipitor 20 mg qd

## 2022-12-12 DIAGNOSIS — M545 Low back pain, unspecified: Secondary | ICD-10-CM | POA: Diagnosis not present

## 2022-12-12 DIAGNOSIS — M542 Cervicalgia: Secondary | ICD-10-CM | POA: Diagnosis not present

## 2022-12-12 DIAGNOSIS — M25512 Pain in left shoulder: Secondary | ICD-10-CM | POA: Diagnosis not present

## 2022-12-12 DIAGNOSIS — Z419 Encounter for procedure for purposes other than remedying health state, unspecified: Secondary | ICD-10-CM | POA: Diagnosis not present

## 2022-12-12 DIAGNOSIS — M25511 Pain in right shoulder: Secondary | ICD-10-CM | POA: Diagnosis not present

## 2022-12-19 ENCOUNTER — Ambulatory Visit (HOSPITAL_COMMUNITY)
Admission: RE | Admit: 2022-12-19 | Discharge: 2022-12-19 | Disposition: A | Discharge status: Home / Self Care | Payer: 59 | Source: Ambulatory Visit | Attending: Internal Medicine | Admitting: Internal Medicine

## 2022-12-19 VITALS — BP 156/96 | HR 87 | Ht 61.0 in | Wt 302.6 lb

## 2022-12-19 DIAGNOSIS — I480 Paroxysmal atrial fibrillation: Secondary | ICD-10-CM | POA: Diagnosis not present

## 2022-12-19 DIAGNOSIS — F1010 Alcohol abuse, uncomplicated: Secondary | ICD-10-CM | POA: Insufficient documentation

## 2022-12-19 DIAGNOSIS — Z6841 Body Mass Index (BMI) 40.0 and over, adult: Secondary | ICD-10-CM | POA: Diagnosis not present

## 2022-12-19 DIAGNOSIS — Z7901 Long term (current) use of anticoagulants: Secondary | ICD-10-CM | POA: Diagnosis not present

## 2022-12-19 DIAGNOSIS — I10 Essential (primary) hypertension: Secondary | ICD-10-CM | POA: Diagnosis not present

## 2022-12-19 DIAGNOSIS — Z79899 Other long term (current) drug therapy: Secondary | ICD-10-CM | POA: Diagnosis not present

## 2022-12-19 DIAGNOSIS — D6869 Other thrombophilia: Secondary | ICD-10-CM | POA: Diagnosis not present

## 2022-12-19 DIAGNOSIS — I4891 Unspecified atrial fibrillation: Secondary | ICD-10-CM | POA: Diagnosis not present

## 2022-12-19 MED ORDER — RIVAROXABAN 20 MG PO TABS: 20.0000 mg | ORAL_TABLET | Freq: Every day | ORAL | 6 refills | Status: DC

## 2022-12-19 MED ORDER — METOPROLOL SUCCINATE ER 25 MG PO TB24: 25.0000 mg | ORAL_TABLET | Freq: Every day | ORAL | 11 refills | Status: DC

## 2022-12-19 NOTE — Patient Instructions (Addendum)
Stop eliquis  Start Xarelto 20mg  once a day with a meal   Follow up 1 year

## 2022-12-19 NOTE — Progress Notes (Signed)
Primary Care Physician: Corwin Levins, MD Referring Physician:[facility] f/u   [name] is a 62 y.o. female with a h/o PMH of alcohol abuse, asthma, obesity, HTN, HLD, h/o palpitations who presented to the ED with a chief complaint of overall weakness, tremors, intermittent headaches, and visual hallucinations. She reported that she has been drinking a pint to 1/5 of liquor daily for the past 2 weeks.  In the ED, noted to be in new onset  afib with RVR.  CIWA initiated.   She is was d/c on metoprolol 75 mg bid and eliquis 5 mg bid. She did not tolerate Cardizem in the hospital. She states that she feels strange on the metoprolol so she has cut back to 50 mg daily. She is being compliant with eliquis. No bleeding issues.has reminded in SR. No further alcohol use. No caffeine. No street drugs.  States no significant snoring or apnea. Liver enzymes were elevated in the hospital and cholesterol meds stopped. Hepatitis panel negative.   F/u in afib clinic, 04/17/21,  as pt needed med refills and has not been back to the office since 06/2020.  She had recently been back in the ER for alcohol abuse mid February. EKG shows SR. She was placed on Librium in the ER which she states has really helped with her craving alcohol but needs a refill. She has only had 2 shots since ER visit. She was off eliquis for a while due to cost but now has insurance and should be able to use the 10$ co pay card.   F/u in Afib clinic, 12/19/22. She is currently in NSR. She has not had any episodes of Afib since last office visit. She admits to be taking Eliquis once daily in the mornings; she does not like to take medications in the evening.    Today, she denies symptoms of palpitations, chest pain, shortness of breath, orthopnea, PND, lower extremity edema, dizziness, presyncope, syncope, or neurologic sequela. The patient is tolerating medications without difficulties and is otherwise without complaint today.   Past Medical  History:  Diagnosis Date   Abscess of bladder 07/28/2016   Alcohol dependence (HCC)    Allergic rhinitis 12/06/2013   Anxiety    Asthma 03/16/2015   Atrial flutter with rapid ventricular response (HCC) 04/01/2020   COLONIC POLYPS, HX OF 04/05/2010   DIVERTICULITIS, HX OF 04/05/2010   DJD (degenerative joint disease)    right knee, mot to severe   Dysrhythmia    GERD (gastroesophageal reflux disease)    no meds   Heart murmur    hx of    History of kidney stones    Hyperlipidemia    Hypertension    Impaired glucose tolerance 12/06/2013   Morbid obesity with BMI of 50.0-59.9, adult (HCC)    Nausea & vomiting 05/24/2022   Peripheral vascular disease (HCC)    Pulmonary embolism (HCC)    Past Surgical History:  Procedure Laterality Date   ABDOMINAL HYSTERECTOMY  age 87   fibroids   BREAST BIOPSY Left    COLONOSCOPY WITH PROPOFOL N/A 07/25/2016   Procedure: COLONOSCOPY WITH PROPOFOL;  Surgeon: Jeani Hawking, MD;  [facility]: [facility];  Service: Endoscopy;  Laterality: N/A;   colonscopy     x 2   CYSTOSCOPY W/ URETERAL STENT PLACEMENT Right 09/05/2021   Procedure: CYSTOSCOPY WITH RETROGRADE PYELOGRAM/URETERAL STENT PLACEMENT;  Surgeon: [name], MD;  Location: [facility];  Service: Urology;  Laterality: Right;   CYSTOSCOPY WITH RETROGRADE PYELOGRAM,  URETEROSCOPY AND STENT PLACEMENT Left 08/14/2022   Procedure: CYSTOSCOPY WITH RETROGRADE PYELOGRAM;  Surgeon: [name], MD;  Location: [facility] OR;  Service: Urology;  Laterality: Left;   CYSTOSCOPY WITH URETEROSCOPY AND STENT PLACEMENT Left 08/14/2022   Procedure: URETEROSCOPY AND STENT PLACEMENT;  Surgeon: [name], MD;  Location: [facility] OR;  Service: Urology;  Laterality: Left;   CYSTOSCOPY/URETEROSCOPY/HOLMIUM LASER/STENT PLACEMENT Right 09/30/2021   Procedure: CYSTOSCOPY/ RETROGRADE/URETEROSCOPY/HOLMIUM LASER/STENT PLACEMENT;  Surgeon: [name], MD;  [facility];  Service: Urology;  Laterality: Right;    CYSTOSCOPY/URETEROSCOPY/HOLMIUM LASER/STENT PLACEMENT Left 09/12/2022   Procedure: CYSTOSCOPY/LEFT RETROGRADE PYELOGRAM/LEFT URETEROSCOPY/HOLMIUM LASER/LEFT STENT EXCHANGE;  Surgeon: [name], MD;  [facility];  Service: Urology;  Laterality: Left;  75 MINUTES NEEDED FOR CASE   IR RADIOLOGIST EVAL & MGMT  08/12/2016   IR RADIOLOGIST EVAL & MGMT  08/21/2016   KNEE ARTHROSCOPY     left    TOTAL KNEE ARTHROPLASTY  07/29/2011   Procedure: TOTAL KNEE ARTHROPLASTY;  Surgeon: Shelda Pal, MD;  [facility]: [facility];  Service: Orthopedics;  Laterality: Right;   TOTAL KNEE ARTHROPLASTY Left 12/14/2012   Procedure: LEFT TOTAL KNEE ARTHROPLASTY;  Surgeon: Shelda Pal, MD;  Location: [facility];  Service: Orthopedics;  Laterality: Left;    Current Outpatient Medications  Medication Sig Dispense Refill   acetaminophen (TYLENOL) 500 MG tablet Take 1 tablet (500 mg total) by mouth every 6 (six) hours as needed for mild pain, fever or headache. (Patient not taking: Reported on 12/03/2022) 30 tablet 0   albuterol (VENTOLIN HFA) 108 (90 Base) MCG/ACT inhaler INHALE 2 PUFFS INTO THE LUNGS EVERY 6 HOURS AS NEEDED 18 each 5   atorvastatin (LIPITOR) 20 MG tablet TAKE 1 TABLET BY MOUTH EVERY DAY 30 tablet 11   Brimonidine Tartrate (LUMIFY) 0.025 % SOLN Place 1 drop into both eyes daily as needed (redness).     calcium carbonate (TUMS - DOSED IN MG ELEMENTAL CALCIUM) 500 MG chewable tablet Chew 1 tablet by mouth daily as needed for indigestion or heartburn.     ELIQUIS 5 MG TABS tablet Take 5 mg by mouth 2 (two) times daily.     famotidine (PEPCID) 20 MG tablet TAKE 1 TABLET BY MOUTH TWICE A DAY 60 tablet 5   LORazepam (ATIVAN) 0.5 MG tablet Take 1 tablet (0.5 mg total) by mouth 2 (two) times daily as needed for anxiety. 30 tablet 2   losartan-hydrochlorothiazide (HYZAAR) 100-25 MG tablet Take 1 tablet by mouth daily. 30 tablet 8   metoprolol succinate (TOPROL-XL) 25 MG 24 hr tablet Take 1 tablet (25 mg total)  by mouth daily. Appointment Required For Further Refills [phone] 30 tablet 0   mometasone-formoterol (DULERA) 100-5 MCG/ACT AERO Inhale 2 puffs into the lungs in the morning and at bedtime. 1 each 5   ondansetron (ZOFRAN-ODT) 4 MG disintegrating tablet Take 1 tablet (4 mg total) by mouth every 8 (eight) hours as needed for nausea or vomiting. 20 tablet 2   oxyCODONE (OXY IR/ROXICODONE) 5 MG immediate release tablet Take 1 tablet (5 mg total) by mouth every 3 (three) hours as needed for up to 12 doses for moderate pain. 12 tablet 0   pantoprazole (PROTONIX) 40 MG tablet Take 1 tablet (40 mg total) by mouth daily. 30 tablet 2   Potassium Citrate 15 MEQ (1620 MG) TBCR Take 1 tablet by mouth 2 (two) times daily as needed (dehydration).     triamcinolone cream (KENALOG) 0.1 % APPLY 1  APPLICATION TOPICALLY 2 (TWO) TIMES DAILY AS NEEDED (ITCHING). 30 g 2   No current facility-administered medications for this visit.    Allergies  Allergen Reactions   Morphine Itching   Shrimp [Shellfish Allergy] Itching and Other (See Comments)    Tongue burns also   Tramadol Other (See Comments)    Caused confusion   Covid-19 (Mrna) Vaccine Hives   Diltiazem Hcl Itching    Pt with itching of the feet when bolus given   Latex Rash   ROS- All systems are reviewed and negative except as per the HPI above  Physical Exam: There were no vitals filed for this visit.  Wt Readings from Last 3 Encounters:  12/03/22 135.2 kg  09/12/22 133.8 kg  09/10/22 133.8 kg    Labs: Lab Results  Component Value Date   NA 142 12/03/2022   K 3.5 12/03/2022   CL 103 12/03/2022   CO2 32 12/03/2022   GLUCOSE 111 (H) 12/03/2022   BUN 21 12/03/2022   CREATININE 1.24 (H) 12/03/2022   CALCIUM 9.5 12/03/2022   PHOS 3.3 05/25/2022   MG 2.1 05/27/2022   Lab Results  Component Value Date   INR 1.1 08/14/2022   Lab Results  Component Value Date   CHOL 171 12/03/2022   HDL 56.90 12/03/2022   LDLCALC 99 12/03/2022    TRIG 78.0 12/03/2022   GEN- The patient is well appearing, alert and oriented x 3 today.   Neck - no JVD or carotid bruit noted Lungs- Clear to ausculation bilaterally, normal work of breathing Heart- Regular rate and rhythm, no murmurs, rubs or gallops, PMI not laterally displaced Extremities- no clubbing, cyanosis, or edema Skin - no rash or ecchymosis noted   EKG  Vent. rate 87 BPM PR interval 152 ms QRS duration 88 ms QT/QTcB 378/454 ms P-R-T axes 47 51 20 Normal sinus rhythm Normal ECG When compared with ECG of 14-Aug-2022 08:10, PREVIOUS ECG IS PRESENT   Echo 04/03/20- 1. Left ventricular ejection fraction, by estimation, is 50 to 55%. The  left ventricle has low normal function. The left ventricle has no regional  wall motion abnormalities. There is mild concentric left ventricular  hypertrophy. Left ventricular  diastolic parameters are indeterminate.   2. Right ventricular systolic function is normal. The right ventricular  size is normal. Tricuspid regurgitation signal is inadequate for assessing  PA pressure.   3. Left atrial size was mildly dilated.   4. Right atrial size was mildly dilated.   5. A small pericardial effusion is present. The pericardial effusion is  circumferential.   6. The mitral valve is normal in structure. No evidence of mitral valve  regurgitation. No evidence of mitral stenosis.   7. The aortic valve is grossly normal. Aortic valve regurgitation is not  visualized. No aortic stenosis is present.   8. The inferior vena cava is normal in size with <50% respiratory  variability, suggesting right atrial pressure of 8 mmHg.   Comparison(s): Changes from prior study are noted. EF now low normal.   Conclusion(s)/Recommendation(s): Otherwise normal echocardiogram, with  minor abnormalities described in the report.    Assessment and Plan: 1. Paroxysmal afib Likely in the setting of alcohol abuse Quiet so far   Another recent ER visit  for  alcohol abuse 03/2021 but was not in afib  Continue 25 mg of metoprolol succinate qd   2. CHA2DS2VASc score of 4 Transition to Xarelto 20 mg daily for compliance. Take Eliquis tonight and tomorrow begin  Xarelto.   3. HTN Stable at home with readings in the 130s.    F/u in afib clinic in one year  Justin Mend, PA-C Afib Clinic [facility] [address] [place], [place] [address] [phone]

## 2022-12-31 NOTE — Patient Outreach (Signed)
  Care Coordination   Closure  Visit Note   12/31/2022 Name: [name] MRN: [id number] DOB: 02/09/1961  [name] is a 62 y.o. year old female who sees Corwin Levins, MD for primary care. No patient contact was made during this encounter.  Documentation encounter only: per notation by Care guide 01/20/2022-unsuccessful outreach attempts. unable to make contact with patient for follow up. Goals closed/case closed.  Goals Addressed             This Visit's Progress    COMPLETED: Managment of asthma symptoms       Interventions Today    Flowsheet Row Most Recent Value  General Interventions   General Interventions Discussed/Reviewed General Interventions Reviewed  [goals closed.]            SDOH assessments and interventions completed:  No  Care Coordination Interventions:  Yes, provided   Follow up plan: No further intervention required.   Encounter Outcome:  Patient Visit Completed   Kathyrn Sheriff, RN, MSN, BSN, CCM Care Management Coordinator [phone]

## 2023-01-11 DIAGNOSIS — Z419 Encounter for procedure for purposes other than remedying health state, unspecified: Secondary | ICD-10-CM | POA: Diagnosis not present

## 2023-01-20 ENCOUNTER — Telehealth (HOSPITAL_COMMUNITY): Payer: Self-pay | Admitting: *Deleted

## 2023-01-20 NOTE — Telephone Encounter (Signed)
Patient called in stating most nights she is having breakthrough afib at 2-3 am. Pt states she is drinking alcohol in the evenings. Encouraged reduction of alcohol use and caffeine use. Pt will try moving metoprolol to bedtime and see if this helps with her symptoms. Pt will call back if issues continue.

## 2023-02-11 DIAGNOSIS — Z419 Encounter for procedure for purposes other than remedying health state, unspecified: Secondary | ICD-10-CM | POA: Diagnosis not present

## 2023-02-17 ENCOUNTER — Telehealth: Payer: Self-pay | Admitting: Internal Medicine

## 2023-02-17 ENCOUNTER — Other Ambulatory Visit: Payer: Self-pay | Admitting: Internal Medicine

## 2023-02-17 ENCOUNTER — Ambulatory Visit: Payer: Self-pay | Admitting: Internal Medicine

## 2023-02-17 DIAGNOSIS — J4540 Moderate persistent asthma, uncomplicated: Secondary | ICD-10-CM

## 2023-02-17 MED ORDER — MOMETASONE FURO-FORMOTEROL FUM 100-5 MCG/ACT IN AERO: 2.0000 | INHALATION_SPRAY | Freq: Two times a day (BID) | RESPIRATORY_TRACT | 8 refills | Status: DC

## 2023-02-17 MED ORDER — LORAZEPAM 0.5 MG PO TABS: 0.5000 mg | ORAL_TABLET | Freq: Two times a day (BID) | ORAL | 2 refills | Status: DC | PRN

## 2023-02-17 MED ORDER — POTASSIUM CHLORIDE ER 10 MEQ PO TBCR: 10.0000 meq | EXTENDED_RELEASE_TABLET | Freq: Every day | ORAL | 2 refills | Status: DC

## 2023-02-17 NOTE — Telephone Encounter (Signed)
 Done erx

## 2023-02-17 NOTE — Telephone Encounter (Signed)
 Copied from CRM [id number]. Topic: Clinical - Medication Refill >> Feb 17, 2023  2:05 PM Isabell A wrote: Most Recent Primary Care Visit:  Provider: NORLEEN LYNWOOD ORN  Department: [facility]  Visit Type: OFFICE VISIT  Date: 12/03/2022  Medication: LORazepam  (ATIVAN ) 0.5 MG tablet mometasone -formoterol  (DULERA ) 100-5 MCG/ACT AERO   Has the patient contacted their pharmacy? Yes (Agent: If no, request that the patient contact the pharmacy for the refill. If patient does not wish to contact the pharmacy document the reason why and proceed with request.) (Agent: If yes, when and what did the pharmacy advise?)  Is this the correct pharmacy for this prescription? Yes If no, delete pharmacy and type the correct one.  This is the patient's preferred pharmacy:  [id number] - [place], [place] - [address] AT [facility] [address] [place] [address] Phone: [phone] Fax: [phone]   Has the prescription been filled recently? Yes  Is the patient out of the medication? Yes  Has the patient been seen for an appointment in the last year OR does the patient have an upcoming appointment? Yes  Can we respond through MyChart? No  Agent: Please be advised that Rx refills may take up to 3 business days. We ask that you follow-up with your pharmacy.

## 2023-02-17 NOTE — Telephone Encounter (Signed)
 Pt requesting Klor-Con  10mEq. Routing to office.         Copied from CRM [id number]. Topic: Clinical - Medication Refill >> Feb 17, 2023  2:02 PM Isabell A wrote: Most Recent Primary Care Visit:  Provider: NORLEEN LYNWOOD ORN  Department: [facility] [place]  Visit Type: OFFICE VISIT  Date: 12/03/2022  Medication: Klor-con   Has the patient contacted their pharmacy? Yes (Agent: If no, request that the patient contact the pharmacy for the refill. If patient does not wish to contact the pharmacy document the reason why and proceed with request.) (Agent: If yes, when and what did the pharmacy advise?)  Is this the correct pharmacy for this prescription? Yes If no, delete pharmacy and type the correct one.  This is the patient's preferred pharmacy:  [id number] - [place], [place] - [address] AT [facility] [address] [place] [address] Phone: [phone] Fax: [phone]   Has the prescription been filled recently? Yes  Is the patient out of the medication? Yes  Has the patient been seen for an appointment in the last year OR does the patient have an upcoming appointment? Yes  Can we respond through MyChart? No  Agent: Please be advised that Rx refills may take up to 3 business days. We ask that you follow-up with your pharmacy. Answer Assessment - Initial Assessment Questions 1. DRUG NAME: What medicine do you need to have refilled?     Klor-con  10mEq 2. REFILLS REMAINING: How many refills are remaining? (Note: The label on the medicine or pill bottle will show how many refills are remaining. If there are no refills remaining, then a renewal may be needed.)     no 3. EXPIRATION DATE: What is the expiration date? (Note: The label states when the prescription will expire, and thus can no longer be refilled.)     na 4. PRESCRIBING HCP: Who prescribed it? Reason: If prescribed by specialist, call should be referred to that  group.     John 5. SYMPTOMS: Do you have any symptoms?  Protocols used: Medication Refill and Renewal Call-A-AH

## 2023-02-17 NOTE — Addendum Note (Signed)
 Addended by: Corwin Levins on: 02/17/2023 05:02 PM   Modules accepted: Orders

## 2023-03-14 DIAGNOSIS — Z419 Encounter for procedure for purposes other than remedying health state, unspecified: Secondary | ICD-10-CM | POA: Diagnosis not present

## 2023-03-30 ENCOUNTER — Other Ambulatory Visit: Payer: Self-pay | Admitting: Internal Medicine

## 2023-04-11 DIAGNOSIS — Z419 Encounter for procedure for purposes other than remedying health state, unspecified: Secondary | ICD-10-CM | POA: Diagnosis not present

## 2023-04-15 ENCOUNTER — Telehealth (HOSPITAL_COMMUNITY): Payer: Self-pay | Admitting: *Deleted

## 2023-04-15 NOTE — Telephone Encounter (Signed)
 Pt called in stating she is having intermittent heart racing at night heart rates can reach 140-150. Pt will try switching metoprolol to bedtime if this does not improve her symptoms she will call back and per Landry Mellow PA will increase metoprolol to 50mg  daily with follow up. Pt will call back after switching to bedtime dosing with update of response.

## 2023-04-17 ENCOUNTER — Encounter (HOSPITAL_COMMUNITY): Payer: Self-pay

## 2023-04-17 ENCOUNTER — Ambulatory Visit (HOSPITAL_COMMUNITY)
Admission: EM | Admit: 2023-04-17 | Discharge: 2023-04-17 | Disposition: A | Discharge status: Home / Self Care | Attending: Family Medicine | Admitting: Family Medicine

## 2023-04-17 ENCOUNTER — Other Ambulatory Visit: Payer: Self-pay

## 2023-04-17 ENCOUNTER — Emergency Department (HOSPITAL_COMMUNITY)

## 2023-04-17 ENCOUNTER — Emergency Department (HOSPITAL_COMMUNITY)
Admission: EM | Admit: 2023-04-17 | Discharge: 2023-04-17 | Disposition: A | Discharge status: Home / Self Care | Attending: Emergency Medicine | Admitting: Emergency Medicine

## 2023-04-17 DIAGNOSIS — N3000 Acute cystitis without hematuria: Secondary | ICD-10-CM | POA: Diagnosis not present

## 2023-04-17 DIAGNOSIS — Z91040 Latex allergy status: Secondary | ICD-10-CM | POA: Insufficient documentation

## 2023-04-17 DIAGNOSIS — R0602 Shortness of breath: Secondary | ICD-10-CM | POA: Insufficient documentation

## 2023-04-17 DIAGNOSIS — R002 Palpitations: Secondary | ICD-10-CM

## 2023-04-17 DIAGNOSIS — D72829 Elevated white blood cell count, unspecified: Secondary | ICD-10-CM | POA: Diagnosis not present

## 2023-04-17 DIAGNOSIS — R0600 Dyspnea, unspecified: Secondary | ICD-10-CM

## 2023-04-17 DIAGNOSIS — N390 Urinary tract infection, site not specified: Secondary | ICD-10-CM | POA: Diagnosis not present

## 2023-04-17 DIAGNOSIS — R42 Dizziness and giddiness: Secondary | ICD-10-CM

## 2023-04-17 DIAGNOSIS — Z7901 Long term (current) use of anticoagulants: Secondary | ICD-10-CM | POA: Diagnosis not present

## 2023-04-17 DIAGNOSIS — R519 Headache, unspecified: Secondary | ICD-10-CM | POA: Diagnosis not present

## 2023-04-17 LAB — CBC WITH DIFFERENTIAL/PLATELET
Abs Immature Granulocytes: 0.09 10*3/uL — ABNORMAL HIGH (ref 0.00–0.07)
Basophils Absolute: 0.1 10*3/uL (ref 0.0–0.1)
Basophils Relative: 1 %
Eosinophils Absolute: 0.3 10*3/uL (ref 0.0–0.5)
Eosinophils Relative: 1 %
HCT: 46.9 % — ABNORMAL HIGH (ref 36.0–46.0)
Hemoglobin: 15.6 g/dL — ABNORMAL HIGH (ref 12.0–15.0)
Immature Granulocytes: 1 %
Lymphocytes Relative: 13 %
Lymphs Abs: 2.2 10*3/uL (ref 0.7–4.0)
MCH: 31 pg (ref 26.0–34.0)
MCHC: 33.3 g/dL (ref 30.0–36.0)
MCV: 93.2 fL (ref 80.0–100.0)
Monocytes Absolute: 0.7 10*3/uL (ref 0.1–1.0)
Monocytes Relative: 4 %
Neutro Abs: 14.1 10*3/uL — ABNORMAL HIGH (ref 1.7–7.7)
Neutrophils Relative %: 80 %
Platelets: 278 10*3/uL (ref 150–400)
RBC: 5.03 MIL/uL (ref 3.87–5.11)
RDW: 13.2 % (ref 11.5–15.5)
WBC: 17.4 10*3/uL — ABNORMAL HIGH (ref 4.0–10.5)
nRBC: 0 % (ref 0.0–0.2)

## 2023-04-17 LAB — URINALYSIS, ROUTINE W REFLEX MICROSCOPIC
Bilirubin Urine: NEGATIVE
Glucose, UA: NEGATIVE mg/dL
Ketones, ur: NEGATIVE mg/dL
Nitrite: POSITIVE — AB
Protein, ur: NEGATIVE mg/dL
Specific Gravity, Urine: 1.011 (ref 1.005–1.030)
WBC, UA: >50 WBC/hpf (ref 0–5)
pH: 5 (ref 5.0–8.0)

## 2023-04-17 LAB — BASIC METABOLIC PANEL
Anion gap: 9 (ref 5–15)
BUN: 19 mg/dL (ref 8–23)
CO2: 24 mmol/L (ref 22–32)
Calcium: 9.1 mg/dL (ref 8.9–10.3)
Chloride: 105 mmol/L (ref 98–111)
Creatinine, Ser: 1.09 mg/dL — ABNORMAL HIGH (ref 0.44–1.00)
GFR, Estimated: 57 mL/min — ABNORMAL LOW (ref >60)
Glucose, Bld: 105 mg/dL — ABNORMAL HIGH (ref 70–99)
Potassium: 4.6 mmol/L (ref 3.5–5.1)
Sodium: 138 mmol/L (ref 135–145)

## 2023-04-17 LAB — BRAIN NATRIURETIC PEPTIDE: B Natriuretic Peptide: 117.2 pg/mL — ABNORMAL HIGH (ref 0.0–100.0)

## 2023-04-17 MED ORDER — SODIUM CHLORIDE 0.9 % IV SOLN
1.0000 g | Freq: Once | INTRAVENOUS | Status: AC
  Administered 2023-04-17: 1 g via INTRAVENOUS
  Filled 2023-04-17: qty 10

## 2023-04-17 MED ORDER — ONDANSETRON HCL 4 MG/2ML IJ SOLN
4.0000 mg | Freq: Once | INTRAMUSCULAR | Status: AC
Start: 2023-04-17 — End: 2023-04-17
  Administered 2023-04-17: 4 mg via INTRAVENOUS
  Filled 2023-04-17: qty 2

## 2023-04-17 MED ORDER — CEPHALEXIN 500 MG PO CAPS: 1000.0000 mg | ORAL_CAPSULE | Freq: Two times a day (BID) | ORAL | 0 refills | Status: AC

## 2023-04-17 NOTE — Discharge Instructions (Signed)
 Contact a health care provider if:  Your symptoms don't get better after 1-2 days of taking antibiotics.  Your symptoms go away and then come back.  You have a fever or chills.  You vomit or feel like you may vomit.    Get help right away if:  You have very bad pain in your back or lower belly.  You faint.

## 2023-04-17 NOTE — ED Provider Notes (Signed)
 [place] EMERGENCY DEPARTMENT AT [facility] Provider Note   CSN: [id number] Arrival date & time: 04/17/23  1112     History  Chief Complaint  Patient presents with   Shortness of Breath    [name] is a 63 y.o. female here for shortness of breath.  Patient states that this morning she woke up with pressure in her head and nasal congestion made her scared.  Has a history of A-fib she does take medicine for anxiety but missed dose this morning.  She is unsure if she had a bout of A-fib.  He is compliant with her Xarelto.  She states all of her symptoms have resolved at this time.  Patient has been having some burning with urination.   Shortness of Breath      Home Medications Prior to Admission medications   Medication Sig Start Date End Date Taking? Authorizing Provider  acetaminophen (TYLENOL) 500 MG tablet Take 1 tablet (500 mg total) by mouth every 6 (six) hours as needed for mild pain, fever or headache. Patient taking differently: Take 500 mg by mouth as needed for mild pain (pain score 1-3), fever or headache. 08/17/22   Lonia Blood, MD  albuterol (VENTOLIN HFA) 108 (90 Base) MCG/ACT inhaler INHALE 2 PUFFS INTO THE LUNGS EVERY 6 HOURS AS NEEDED 12/05/22   Corwin Levins, MD  atorvastatin (LIPITOR) 20 MG tablet TAKE 1 TABLET BY MOUTH EVERY DAY 10/09/22   Corwin Levins, MD  calcium carbonate (TUMS - DOSED IN MG ELEMENTAL CALCIUM) 500 MG chewable tablet Chew 1 tablet by mouth daily as needed for indigestion or heartburn.    [provider]  famotidine (PEPCID) 20 MG tablet TAKE 1 TABLET BY MOUTH TWICE A DAY 11/06/22   Cobb, Ruby Cola, NP  LORazepam (ATIVAN) 0.5 MG tablet Take 1 tablet (0.5 mg total) by mouth 2 (two) times daily as needed for anxiety. 02/17/23   Corwin Levins, MD  losartan-hydrochlorothiazide (HYZAAR) 100-25 MG tablet Take 1 tablet by mouth daily. 06/20/22   Corwin Levins, MD  metoprolol succinate (TOPROL-XL) 25 MG 24 hr tablet Take 1  tablet (25 mg total) by mouth daily. 12/19/22   Eustace Pen, PA-C  mometasone-formoterol (DULERA) 100-5 MCG/ACT AERO Inhale 2 puffs into the lungs in the morning and at bedtime. 02/17/23   Corwin Levins, MD  ondansetron (ZOFRAN-ODT) 4 MG disintegrating tablet Take 1 tablet (4 mg total) by mouth every 8 (eight) hours as needed for nausea or vomiting. Patient taking differently: Take 4 mg by mouth as needed for nausea or vomiting. 12/05/22   Corwin Levins, MD  pantoprazole (PROTONIX) 40 MG tablet Take 1 tablet (40 mg total) by mouth daily. 11/19/21   Cobb, Ruby Cola, NP  potassium chloride (KLOR-CON 10) 10 MEQ tablet Take 1 tablet (10 mEq total) by mouth daily. 02/17/23   Corwin Levins, MD  Potassium Citrate 15 MEQ (1620 MG) TBCR Take 1 tablet by mouth as needed (dehydration). 08/21/22   [provider]  rivaroxaban (XARELTO) 20 MG TABS tablet Take 1 tablet (20 mg total) by mouth daily with supper. 12/19/22   Eustace Pen, PA-C  triamcinolone cream (KENALOG) 0.1 % APPLY 1 APPLICATION TOPICALLY 2 (TWO) TIMES DAILY AS NEEDED (ITCHING). 09/15/22   Corwin Levins, MD      Allergies    Morphine, Shrimp [shellfish allergy], Tramadol, Covid-19 (mrna) vaccine, Diltiazem hcl, and Latex    Review of Systems  Review of Systems  Respiratory:  Positive for shortness of breath.     Physical Exam Updated Vital Signs BP 134/88   Pulse (!) 103   Temp 97.7 F (36.5 C)   Resp 20   Ht 5\' 2"  (1.575 m)   Wt 131.5 kg   SpO2 98%   BMI 53.04 kg/m  Physical Exam Vitals and nursing note reviewed.  Constitutional:      General: She is not in acute distress.    Appearance: She is well-developed. She is not diaphoretic.  HENT:     Head: Normocephalic and atraumatic.     Right Ear: External ear normal.     Left Ear: External ear normal.     Nose: Nose normal.     Mouth/Throat:     Mouth: Mucous membranes are moist.  Eyes:     General: No scleral icterus.    Conjunctiva/sclera: Conjunctivae  normal.  Cardiovascular:     Rate and Rhythm: Normal rate and regular rhythm.     Heart sounds: Normal heart sounds. No murmur heard.    No friction rub. No gallop.  Pulmonary:     Effort: Pulmonary effort is normal. No respiratory distress.     Breath sounds: Normal breath sounds. No wheezing, rhonchi or rales.  Abdominal:     General: Bowel sounds are normal. There is no distension.     Palpations: Abdomen is soft. There is no mass.     Tenderness: There is no abdominal tenderness. There is no guarding.  Musculoskeletal:     Cervical back: Normal range of motion.  Skin:    General: Skin is warm and dry.  Neurological:     Mental Status: She is alert and oriented to person, place, and time.  Psychiatric:        Behavior: Behavior normal.     ED Results / Procedures / Treatments   Labs (all labs ordered are listed, but only abnormal results are displayed) Labs Reviewed  URINALYSIS, ROUTINE W REFLEX MICROSCOPIC - Abnormal; Notable for the following components:      Result Value   APPearance CLOUDY (*)    Hgb urine dipstick MODERATE (*)    Nitrite POSITIVE (*)    Leukocytes,Ua LARGE (*)    Bacteria, UA MANY (*)    All other components within normal limits    EKG None  Radiology No results found.  Procedures Procedures    Medications Ordered in ED Medications - No data to display  ED Course/ Medical Decision Making/ A&P Clinical Course as of 04/18/23 1118  Fri Apr 17, 2023  1321 WBC(!): 17.4 Likely due to urinary tract infection [AH]    Clinical Course User Index [AH] Arthor Captain, PA-C                                 Medical Decision Making Amount and/or Complexity of Data Reviewed Labs: ordered. Decision-making details documented in ED Course. Radiology: ordered.  Risk Prescription drug management.   This patient presents to the ED for concern of shortness of breath and dysuria differential diagnosis includes The emergent differential diagnosis  for shortness of breath includes, but is not limited to, Pulmonary edema, bronchoconstriction, Pneumonia, Pulmonary embolism, Pneumotherax/ Hemothorax, Dysrythmia, ACS.      Additional history obtained:  Additional history obtained from previous urgent care visit   Lab Tests:  I Ordered, and personally interpreted labs.  The pertinent results include:  White count 17,000, BNP 117.  BMP without significant abnormality urine appears infected   Imaging Studies ordered:  I ordered imaging studies including two-view chest x-ray I independently visualized and interpreted imaging which showed no acute findings I agree with the radiologist interpretation   Medicines ordered and prescription drug management:  I ordered medication including Rocephin for urinary tract infection Reevaluation of the patient after these medicines showed that the patient improved I have reviewed the patients home medicines and have made adjustments as needed  MDM  With known history of anxiety here with shortness of breath.  Suspect she had an anxiety issue today.  She does not appear to have any emergent cause of her shortness of breath .  She does not have any signs of volume overload, pneumonia, pulmonary edema.  Symptoms are not consistent with acute coronary syndrome. She does appear to have a urinary tract infection was treated here.  She is asymptomatic at this time.  Will discharge with antibiotics for treatment of UTI and close outpatient follow-up.   Social Determinants of Health:  Patient has access to PCP ensuring safe discharge and outpatient follow-up in a timely manner.  Gust return precautions.           Final Clinical Impression(s) / ED Diagnoses Final diagnoses:  None    Rx / DC Orders ED Discharge Orders     None         Arthor Captain, PA-C 04/18/23 1121    Margarita Grizzle, MD 04/22/23 [id number]

## 2023-04-17 NOTE — ED Notes (Signed)
 Patient is being discharged from the Urgent Care and sent to the Emergency Department via POV . Per Dr. Marlinda Mike, patient is in need of higher level of care due to SOB and dizziness. Patient is aware and verbalizes understanding of plan of care.  Vitals:   04/17/23 1036  BP: 131/75  Pulse: 94  Resp: 20  SpO2: 96%

## 2023-04-17 NOTE — ED Triage Notes (Addendum)
 Chief Complaint: Heart racing, Sob, and dizziness. States she has a history of Afib. States she thinks it may be a panic attack and she did not take her anxiety medication this mroning. Patient has a history of controlled asthma. Denies any sick symptoms. States feels like her head is tight and ears are clogging up off and on.   Sick exposure: No  Onset: today   Prescriptions or OTC medications tried: Yes- inhaler    with little relief  New foods, medications, or products: No  Recent Travel: No

## 2023-04-17 NOTE — Discharge Instructions (Signed)
 She will go by private car to the ER; staff wheeled her to her husband's car

## 2023-04-17 NOTE — ED Provider Notes (Signed)
 [facility]    CSN: [id number] Arrival date & time: 04/17/23  1019      History   Chief Complaint Chief Complaint  Patient presents with   Shortness of Breath   Tachycardia    HPI [name] is a 63 y.o. female.    Shortness of Breath  Here for shortness of breath, headache, and palpitations.  She also feels dizziness.  All the symptoms have been coming together in waves and can last a few minutes and then will have a few minutes between episodes.  She for started having them about 5 this morning.  She does have a history of A-fib and does take medicine for anxiety.  She told staff that she thought maybe this was happening because she had missed her dose of anxiety medicine this morning.  Please note this began before she would have taken that medication anyway.  No chest pain no fever Past Medical History:  Diagnosis Date   Abscess of bladder 07/28/2016   Alcohol dependence (HCC)    Allergic rhinitis 12/06/2013   Anxiety    Asthma 03/16/2015   Atrial flutter with rapid ventricular response (HCC) 04/01/2020   COLONIC POLYPS, HX OF 04/05/2010   DIVERTICULITIS, HX OF 04/05/2010   DJD (degenerative joint disease)    right knee, mot to severe   Dysrhythmia    GERD (gastroesophageal reflux disease)    no meds   Heart murmur    hx of    History of kidney stones    Hyperlipidemia    Hypertension    Impaired glucose tolerance 12/06/2013   Morbid obesity with BMI of 50.0-59.9, adult (HCC)    Nausea & vomiting 05/24/2022   Peripheral vascular disease (HCC)    Pulmonary embolism (HCC)     Patient Active Problem List   Diagnosis Date Noted   Hypercoagulable state due to paroxysmal atrial fibrillation (HCC) 12/19/2022   Acute unilateral obstructive uropathy 08/14/2022   Chronic kidney disease, stage 3a (HCC) 08/14/2022   Alcohol withdrawal with perceptual disturbances (HCC) 05/24/2022   Nausea & vomiting 05/24/2022   Abdominal pain 05/24/2022    History of asthma 05/24/2022   Elevated AST (SGOT) 05/24/2022   Prediabetes 05/24/2022   Asthma, moderate persistent, poorly-controlled 11/13/2021   Cough 11/06/2021   Wheezing 11/06/2021   Chronic anticoagulation 11/06/2021   E coli bacteremia 09/06/2021   AKI (acute kidney injury) (HCC) 09/06/2021   Right ureteral stone 09/06/2021   Hydronephrosis of right kidney 09/06/2021   Acute pyelonephritis 09/06/2021   Alcohol dependence with withdrawal delirium (HCC) 09/02/2021   Vitamin D deficiency 03/13/2021   Microhematuria 03/13/2021   Tear of distal tendon of biceps 11/16/2020   Positive blood cultures    Asthma exacerbation 07/22/2020   Hyperkalemia 07/22/2020   Paroxysmal atrial fibrillation (HCC) 04/01/2020   Atrial flutter with rapid ventricular response (HCC) 04/01/2020   Alcohol dependence with withdrawal (HCC) 12/19/2019   History of pulmonary embolism 12/19/2019   Pneumonia due to COVID-19 virus 02/10/2019   Pulmonary embolism (HCC) 07/06/2018   Leukocytosis 09/22/2017   Rash 09/22/2017   Sepsis (HCC) 09/12/2016   Colovesical fistula 09/12/2016   Alcohol abuse 07/28/2016   Colonic diverticulum 07/28/2016   Hyperlipidemia 07/10/2016   Mass of left side of neck 07/10/2016   Head and neck lymphadenopathy 07/10/2016   Eustachian tube disorder 01/25/2016   Peripheral edema 03/16/2015   Asthma 03/16/2015   Right shoulder pain 03/16/2015   Hypokalemia 10/04/2014   Upper airway cough  syndrome 10/03/2014   Morbid obesity with BMI of 50.0-59.9, adult (HCC) 10/03/2014   Lower back pain 05/25/2014   Bilateral shoulder pain 05/25/2014   Allergic rhinitis 12/06/2013   Impaired glucose tolerance 12/06/2013   Expected blood loss anemia 12/16/2012   Morbid obesity (HCC) 12/15/2012   S/P left TKA 12/14/2012   Goiter 11/26/2012   Preop exam for internal medicine 11/26/2012   Vaginal bleeding 04/30/2011   Colon polyps 02/10/2011   Eczema 02/10/2011   Depression 02/10/2011    Encounter for well adult exam with abnormal findings 09/27/2010   Anxiety state 04/05/2010   DIVERTICULITIS, HX OF 04/05/2010   PALPITATIONS, HX OF 09/14/2007   Essential hypertension 04/24/2007   VOCAL CORD DISORDER 04/24/2007   Extrinsic asthma 04/24/2007   GERD 04/24/2007    Past Surgical History:  Procedure Laterality Date   ABDOMINAL HYSTERECTOMY  age 52   fibroids   BREAST BIOPSY Left    COLONOSCOPY WITH PROPOFOL N/A 07/25/2016   Procedure: COLONOSCOPY WITH PROPOFOL;  Surgeon: Jeani Hawking, MD;  Location: [facility];  Service: Endoscopy;  Laterality: N/A;   colonscopy     x 2   CYSTOSCOPY W/ URETERAL STENT PLACEMENT Right 09/05/2021   Procedure: CYSTOSCOPY WITH RETROGRADE PYELOGRAM/URETERAL STENT PLACEMENT;  Surgeon: [name], MD;  Location: [facility];  Service: Urology;  Laterality: Right;   CYSTOSCOPY WITH RETROGRADE PYELOGRAM, URETEROSCOPY AND STENT PLACEMENT Left 08/14/2022   Procedure: CYSTOSCOPY WITH RETROGRADE PYELOGRAM;  Surgeon: [name], MD;  Location: [facility] OR;  Service: Urology;  Laterality: Left;   CYSTOSCOPY WITH URETEROSCOPY AND STENT PLACEMENT Left 08/14/2022   Procedure: URETEROSCOPY AND STENT PLACEMENT;  Surgeon: [name], MD;  Location: [facility] OR;  Service: Urology;  Laterality: Left;   CYSTOSCOPY/URETEROSCOPY/HOLMIUM LASER/STENT PLACEMENT Right 09/30/2021   Procedure: CYSTOSCOPY/ RETROGRADE/URETEROSCOPY/HOLMIUM LASER/STENT PLACEMENT;  Surgeon: [name], MD;  Location: [facility];  Service: Urology;  Laterality: Right;   CYSTOSCOPY/URETEROSCOPY/HOLMIUM LASER/STENT PLACEMENT Left 09/12/2022   Procedure: CYSTOSCOPY/LEFT RETROGRADE PYELOGRAM/LEFT URETEROSCOPY/HOLMIUM LASER/LEFT STENT EXCHANGE;  Surgeon: [name], MD;  Location: [facility];  Service: Urology;  Laterality: Left;  75 MINUTES NEEDED FOR CASE   IR RADIOLOGIST EVAL & MGMT  08/12/2016   IR RADIOLOGIST EVAL & MGMT  08/21/2016   KNEE ARTHROSCOPY     left    TOTAL KNEE ARTHROPLASTY  07/29/2011    Procedure: TOTAL KNEE ARTHROPLASTY;  Surgeon: Shelda Pal, MD;  Location: [facility];  Service: Orthopedics;  Laterality: Right;   TOTAL KNEE ARTHROPLASTY Left 12/14/2012   Procedure: LEFT TOTAL KNEE ARTHROPLASTY;  Surgeon: Shelda Pal, MD;  Location: [facility];  Service: Orthopedics;  Laterality: Left;    OB History     Gravida  1   Para  1   Term      Preterm      AB      Living  1      SAB      IAB      Ectopic      Multiple      Live Births               Home Medications    Prior to Admission medications   Medication Sig Start Date End Date Taking? Authorizing Provider  acetaminophen (TYLENOL) 500 MG tablet Take 1 tablet (500 mg total) by mouth every 6 (six) hours as needed for mild pain, fever or headache. Patient taking differently: Take 500 mg by mouth as needed for mild pain (  pain score 1-3), fever or headache. 08/17/22  Yes Lonia Blood, MD  albuterol (VENTOLIN HFA) 108 (90 Base) MCG/ACT inhaler INHALE 2 PUFFS INTO THE LUNGS EVERY 6 HOURS AS NEEDED 12/05/22  Yes Corwin Levins, MD  atorvastatin (LIPITOR) 20 MG tablet TAKE 1 TABLET BY MOUTH EVERY DAY 10/09/22  Yes Corwin Levins, MD  calcium carbonate (TUMS - DOSED IN MG ELEMENTAL CALCIUM) 500 MG chewable tablet Chew 1 tablet by mouth daily as needed for indigestion or heartburn.   Yes [provider]  famotidine (PEPCID) 20 MG tablet TAKE 1 TABLET BY MOUTH TWICE A DAY 11/06/22  Yes Cobb, Ruby Cola, NP  LORazepam (ATIVAN) 0.5 MG tablet Take 1 tablet (0.5 mg total) by mouth 2 (two) times daily as needed for anxiety. 02/17/23  Yes Corwin Levins, MD  losartan-hydrochlorothiazide (HYZAAR) 100-25 MG tablet Take 1 tablet by mouth daily. 06/20/22  Yes Corwin Levins, MD  metoprolol succinate (TOPROL-XL) 25 MG 24 hr tablet Take 1 tablet (25 mg total) by mouth daily. 12/19/22  Yes Eustace Pen, PA-C  mometasone-formoterol (DULERA) 100-5 MCG/ACT AERO Inhale 2 puffs into the lungs in the morning and at bedtime.  02/17/23  Yes Corwin Levins, MD  ondansetron (ZOFRAN-ODT) 4 MG disintegrating tablet Take 1 tablet (4 mg total) by mouth every 8 (eight) hours as needed for nausea or vomiting. Patient taking differently: Take 4 mg by mouth as needed for nausea or vomiting. 12/05/22  Yes Corwin Levins, MD  pantoprazole (PROTONIX) 40 MG tablet Take 1 tablet (40 mg total) by mouth daily. 11/19/21  Yes Cobb, Ruby Cola, NP  potassium chloride (KLOR-CON 10) 10 MEQ tablet Take 1 tablet (10 mEq total) by mouth daily. 02/17/23  Yes Corwin Levins, MD  Potassium Citrate 15 MEQ (1620 MG) TBCR Take 1 tablet by mouth as needed (dehydration). 08/21/22  Yes [provider]  rivaroxaban (XARELTO) 20 MG TABS tablet Take 1 tablet (20 mg total) by mouth daily with supper. 12/19/22  Yes Eustace Pen, PA-C  triamcinolone cream (KENALOG) 0.1 % APPLY 1 APPLICATION TOPICALLY 2 (TWO) TIMES DAILY AS NEEDED (ITCHING). 09/15/22  Yes Corwin Levins, MD    Family History Family History  Problem Relation Age of Onset   Stroke Mother    COPD Father    Lymphoma Sister    Alcoholism Sister     Social History Social History   Tobacco Use   Smoking status: Former    Current packs/day: 0.00    Average packs/day: 0.5 packs/day for 7.0 years (3.5 ttl pk-yrs)    Types: Cigarettes    Start date: 02/11/1976    Quit date: 02/11/1983    Years since quitting: 40.2   Smokeless tobacco: Never  Vaping Use   Vaping status: Never Used  Substance Use Topics   Alcohol use: Not Currently    Alcohol/week: 1.0 standard drink of alcohol    Types: 1 Shots of liquor per week    Comment: occasional   Drug use: Not Currently    Types: Marijuana    Comment: smoked marijuana x 10 years.  Quit in 1985.     Allergies   Morphine, Shrimp [shellfish allergy], Tramadol, Covid-19 (mrna) vaccine, Diltiazem hcl, and Latex   Review of Systems Review of Systems  Respiratory:  Positive for shortness of breath.      Physical Exam Triage Vital  Signs ED Triage Vitals  Encounter Vitals Group     BP 04/17/23 1036 131/75  Systolic BP Percentile --      Diastolic BP Percentile --      Pulse Rate 04/17/23 1036 94     Resp 04/17/23 1036 20     Temp --      Temp src --      SpO2 04/17/23 1036 96 %     Weight 04/17/23 1035 292 lb (132.5 kg)     Height 04/17/23 1035 5\' 1"  (1.549 m)     Head Circumference --      Peak Flow --      Pain Score 04/17/23 1035 0     Pain Loc --      Pain Education --      Exclude from Growth Chart --    No data found.  Updated Vital Signs BP 131/75 (BP Location: Right Arm)   Pulse 94   Resp 20   Ht 5\' 1"  (1.549 m)   Wt 132.5 kg   SpO2 96%   BMI 55.17 kg/m   Visual Acuity Right Eye Distance:   Left Eye Distance:   Bilateral Distance:    Right Eye Near:   Left Eye Near:    Bilateral Near:     Physical Exam Vitals reviewed.  Constitutional:      General: She is not in acute distress.    Appearance: She is not toxic-appearing.  HENT:     Mouth/Throat:     Mouth: Mucous membranes are moist.  Eyes:     Extraocular Movements: Extraocular movements intact.     Pupils: Pupils are equal, round, and reactive to light.  Cardiovascular:     Rate and Rhythm: Regular rhythm. Tachycardia present.  Pulmonary:     Effort: Pulmonary effort is normal.     Breath sounds: Normal breath sounds.  Musculoskeletal:     Cervical back: Neck supple.  Lymphadenopathy:     Cervical: No cervical adenopathy.  Skin:    Coloration: Skin is not jaundiced or pale.  Neurological:     General: No focal deficit present.     Mental Status: She is alert and oriented to person, place, and time.  Psychiatric:        Behavior: Behavior normal.      UC Treatments / Results  Labs (all labs ordered are listed, but only abnormal results are displayed) Labs Reviewed - No data to display  EKG   Radiology No results found.  Procedures Procedures (including critical care time)  Medications Ordered in  UC Medications - No data to display  Initial Impression / Assessment and Plan / UC Course  I have reviewed the triage vital signs and the nursing notes.  Pertinent labs & imaging results that were available during my care of the patient were reviewed by me and considered in my medical decision making (see chart for details).     EKG shows sinus rhythm with a ventricular rate between 89 and 104.  There are a couple of premature complexes.  Also the morphology of her inferior leads is a good bit different from previous.  Though she does not seem to be in atrial fibrillation at this time, I am concerned her symptoms need further evaluation that we cannot do here in the urgent care.  She will proceed to the emergency room with her husband driving. Final Clinical Impressions(s) / UC Diagnoses   Final diagnoses:  Shortness of breath  Palpitations  Nonintractable headache, unspecified chronicity pattern, unspecified headache type  Dizziness     Discharge  Instructions      She will go by private car to the ER; staff wheeled her to her husband's car    ED Prescriptions   None    PDMP not reviewed this encounter.   Zenia Resides, MD 04/17/23 [phone]

## 2023-04-17 NOTE — ED Triage Notes (Signed)
 Pt here for [facility] for 2 days. Pt states it usually goes away but today its persistent. Denies CP. C/O ear pain in both. Denies n/v/d

## 2023-04-20 ENCOUNTER — Telehealth: Payer: Self-pay

## 2023-04-20 NOTE — Transitions of Care (Post Inpatient/ED Visit) (Signed)
 04/20/2023  Name: [name] MRN: [id number] DOB: July 20, 1960  Today's TOC FU Call Status: Today's TOC FU Call Status:: Successful TOC FU Call Completed TOC FU Call Complete Date: 04/20/23 Patient's Name and Date of Birth confirmed.  Transition Care Management Follow-up Telephone Call Date of Discharge: 04/17/22 Discharge Facility: [facility]) Type of Discharge: Emergency Department Reason for ED Visit: Other: (Acute cystitis without hematuria) How have you been since you were released from the hospital?: Better Any questions or concerns?: Yes Patient Questions/Concerns:: patient would like bood work rechecked when she comes in for office visit on 04/24/23 Patient Questions/Concerns Addressed: Notified Provider of Patient Questions/Concerns  Items Reviewed: Did you receive and understand the discharge instructions provided?: Yes Medications obtained,verified, and reconciled?: Yes (Medications Reviewed) Any new allergies since your discharge?: No Dietary orders reviewed?: NA Do you have support at home?: Yes  Medications Reviewed Today: Medications Reviewed Today     Reviewed by Leigh Aurora, CMA (Certified Medical Assistant) on 04/20/23 at 1536  Med List Status: <None>   Medication Order Taking? Sig Documenting Provider Last Dose Status Informant  acetaminophen (TYLENOL) 500 MG tablet [id number] No Take 1 tablet (500 mg total) by mouth every 6 (six) hours as needed for mild pain, fever or headache.  Patient taking differently: Take 500 mg by mouth as needed for mild pain (pain score 1-3), fever or headache.   Lonia Blood, MD 04/17/2023 Active Self  albuterol (VENTOLIN HFA) 108 (90 Base) MCG/ACT inhaler [id number] No INHALE 2 PUFFS INTO THE LUNGS EVERY 6 HOURS AS NEEDED Corwin Levins, MD 04/17/2023 Active   atorvastatin (LIPITOR) 20 MG tablet [id number] No TAKE 1 TABLET BY MOUTH EVERY DAY Corwin Levins, MD 04/17/2023 Active   calcium carbonate (TUMS - DOSED IN MG  ELEMENTAL CALCIUM) 500 MG chewable tablet [id number] No Chew 1 tablet by mouth daily as needed for indigestion or heartburn. [provider] 04/17/2023 Active Self  cephALEXin (KEFLEX) 500 MG capsule [id number]  Take 2 capsules (1,000 mg total) by mouth 2 (two) times daily for 7 days. Arthor Captain, PA-C  Active   famotidine (PEPCID) 20 MG tablet [id number] No TAKE 1 TABLET BY MOUTH TWICE A DAY Cobb, Ruby Cola, NP 04/17/2023 Active   LORazepam (ATIVAN) 0.5 MG tablet [id number] No Take 1 tablet (0.5 mg total) by mouth 2 (two) times daily as needed for anxiety. Corwin Levins, MD 04/16/2023 Active   losartan-hydrochlorothiazide (HYZAAR) 100-25 MG tablet [id number] No Take 1 tablet by mouth daily. Corwin Levins, MD 04/17/2023 Active Self  metoprolol succinate (TOPROL-XL) 25 MG 24 hr tablet [id number] No Take 1 tablet (25 mg total) by mouth daily. Eustace Pen, PA-C 04/17/2023 Active   mometasone-formoterol (DULERA) 100-5 MCG/ACT AERO [id number] No Inhale 2 puffs into the lungs in the morning and at bedtime. Corwin Levins, MD 04/17/2023 Active   ondansetron (ZOFRAN-ODT) 4 MG disintegrating tablet [id number] No Take 1 tablet (4 mg total) by mouth every 8 (eight) hours as needed for nausea or vomiting.  Patient taking differently: Take 4 mg by mouth as needed for nausea or vomiting.   Corwin Levins, MD Past Month Active   pantoprazole (PROTONIX) 40 MG tablet [id number] No Take 1 tablet (40 mg total) by mouth daily. Noemi Chapel, NP 04/17/2023 Active Self  potassium chloride (KLOR-CON 10) 10 MEQ tablet [id number] No Take 1 tablet (10 mEq total) by mouth daily. Corwin Levins, MD 04/17/2023 Active   Potassium Citrate 15 MEQ (1620  MG) TBCR [id number] No Take 1 tablet by mouth as needed (dehydration). [provider] 04/17/2023 Active Self  rivaroxaban (XARELTO) 20 MG TABS tablet [id number] No Take 1 tablet (20 mg total) by mouth daily with supper. Eustace Pen, PA-C 04/17/2023 Active   triamcinolone cream  (KENALOG) 0.1 % [id number] No APPLY 1 APPLICATION TOPICALLY 2 (TWO) TIMES DAILY AS NEEDED (ITCHING). Corwin Levins, MD Past Month Active             Home Care and Equipment/Supplies: Were Home Health Services Ordered?: NA Any new equipment or medical supplies ordered?: NA  Functional Questionnaire: Do you need assistance with bathing/showering or dressing?: No Do you need assistance with meal preparation?: No Do you need assistance with eating?: No Do you have difficulty maintaining continence: No Do you need assistance with getting out of bed/getting out of a chair/moving?: No Do you have difficulty managing or taking your medications?: No  Follow up appointments reviewed: PCP Follow-up appointment confirmed?: Yes Date of PCP follow-up appointment?: 04/24/23 Follow-up Provider: Oliver Barre, MD Specialist [facility] Follow-up appointment confirmed?: NA Do you need transportation to your follow-up appointment?: No Do you understand care options if your condition(s) worsen?: Yes-patient verbalized understanding    SIGNATURE  Agnes Lawrence, CMA (AAMA)  [facility] [phone]

## 2023-04-21 LAB — URINE CULTURE: Culture: >=100000 — AB

## 2023-04-22 ENCOUNTER — Telehealth (HOSPITAL_BASED_OUTPATIENT_CLINIC_OR_DEPARTMENT_OTHER): Payer: Self-pay

## 2023-04-22 NOTE — Telephone Encounter (Signed)
 Post ED Visit - Positive Culture Follow-up  Culture report reviewed by antimicrobial stewardship pharmacist: [facility] [x]  Daylene Posey, Pharm.D. []  Celedonio Miyamoto, Pharm.D., BCPS AQ-ID []  Garvin Fila, Pharm.D., BCPS []  Georgina Pillion, Pharm.D., BCPS []  [place], [address].D., BCPS, AAHIVP []  Estella Husk, Pharm.D., BCPS, AAHIVP []  Lysle Pearl, PharmD, BCPS []  Phillips Climes, PharmD, BCPS []  Agapito Games, PharmD, BCPS []  Verlan Friends, PharmD []  Mervyn Gay, PharmD, BCPS []  Vinnie Level, PharmD  [facility] []  Len Childs, PharmD []  Greer Pickerel, PharmD []  Adalberto Shanley, PharmD []  Perlie Gold, Rph []  Lonell Face) Jean Rosenthal, PharmD []  Earl Many, PharmD []  Junita Push, PharmD []  Dorna Leitz, PharmD []  Terrilee Files, PharmD []  Lynann Beaver, PharmD []  Keturah Barre, PharmD []  Loralee Pacas, PharmD []  Bernadene Person, PharmD   Positive urine culture Treated with Cephalexin, organism sensitive to the same and no further patient follow-up is required at this time.  [name] 04/22/2023, 8:32 AM

## 2023-04-24 ENCOUNTER — Ambulatory Visit: Admitting: Internal Medicine

## 2023-04-29 ENCOUNTER — Other Ambulatory Visit: Payer: Self-pay

## 2023-04-29 ENCOUNTER — Emergency Department (HOSPITAL_COMMUNITY)

## 2023-04-29 ENCOUNTER — Encounter (HOSPITAL_COMMUNITY): Payer: Self-pay | Admitting: Emergency Medicine

## 2023-04-29 ENCOUNTER — Emergency Department (HOSPITAL_COMMUNITY)
Admission: EM | Admit: 2023-04-29 | Discharge: 2023-04-29 | Disposition: A | Discharge status: Home / Self Care | Attending: Emergency Medicine | Admitting: Emergency Medicine

## 2023-04-29 DIAGNOSIS — R600 Localized edema: Secondary | ICD-10-CM | POA: Diagnosis not present

## 2023-04-29 DIAGNOSIS — I10 Essential (primary) hypertension: Secondary | ICD-10-CM | POA: Diagnosis not present

## 2023-04-29 DIAGNOSIS — J45909 Unspecified asthma, uncomplicated: Secondary | ICD-10-CM | POA: Insufficient documentation

## 2023-04-29 DIAGNOSIS — Z7901 Long term (current) use of anticoagulants: Secondary | ICD-10-CM | POA: Insufficient documentation

## 2023-04-29 DIAGNOSIS — Z91040 Latex allergy status: Secondary | ICD-10-CM | POA: Diagnosis not present

## 2023-04-29 DIAGNOSIS — R002 Palpitations: Secondary | ICD-10-CM | POA: Diagnosis not present

## 2023-04-29 DIAGNOSIS — Z79899 Other long term (current) drug therapy: Secondary | ICD-10-CM | POA: Insufficient documentation

## 2023-04-29 DIAGNOSIS — N3001 Acute cystitis with hematuria: Secondary | ICD-10-CM | POA: Diagnosis not present

## 2023-04-29 LAB — URINALYSIS, ROUTINE W REFLEX MICROSCOPIC
Bilirubin Urine: NEGATIVE
Glucose, UA: NEGATIVE mg/dL
Ketones, ur: NEGATIVE mg/dL
Nitrite: NEGATIVE
Protein, ur: 30 mg/dL — AB
Specific Gravity, Urine: 1.015 (ref 1.005–1.030)
WBC, UA: >50 WBC/hpf (ref 0–5)
pH: 6 (ref 5.0–8.0)

## 2023-04-29 LAB — BASIC METABOLIC PANEL
Anion gap: 12 (ref 5–15)
BUN: 20 mg/dL (ref 8–23)
CO2: 25 mmol/L (ref 22–32)
Calcium: 9.2 mg/dL (ref 8.9–10.3)
Chloride: 103 mmol/L (ref 98–111)
Creatinine, Ser: 1.31 mg/dL — ABNORMAL HIGH (ref 0.44–1.00)
GFR, Estimated: 46 mL/min — ABNORMAL LOW (ref >60)
Glucose, Bld: 125 mg/dL — ABNORMAL HIGH (ref 70–99)
Potassium: 3.5 mmol/L (ref 3.5–5.1)
Sodium: 140 mmol/L (ref 135–145)

## 2023-04-29 LAB — CBC
HCT: 45.3 % (ref 36.0–46.0)
Hemoglobin: 14.7 g/dL (ref 12.0–15.0)
MCH: 30.8 pg (ref 26.0–34.0)
MCHC: 32.5 g/dL (ref 30.0–36.0)
MCV: 94.8 fL (ref 80.0–100.0)
Platelets: 258 10*3/uL (ref 150–400)
RBC: 4.78 MIL/uL (ref 3.87–5.11)
RDW: 13.4 % (ref 11.5–15.5)
WBC: 13 10*3/uL — ABNORMAL HIGH (ref 4.0–10.5)
nRBC: 0 % (ref 0.0–0.2)

## 2023-04-29 LAB — TROPONIN I (HIGH SENSITIVITY): Troponin I (High Sensitivity): 26 ng/L — ABNORMAL HIGH (ref <18)

## 2023-04-29 LAB — D-DIMER, QUANTITATIVE: D-Dimer, Quant: 0.42 ug{FEU}/mL (ref 0.00–0.50)

## 2023-04-29 MED ORDER — ONDANSETRON 4 MG PO TBDP
4.0000 mg | ORAL_TABLET | Freq: Once | ORAL | Status: AC | PRN
  Administered 2023-04-29: 4 mg via ORAL
  Filled 2023-04-29: qty 1

## 2023-04-29 MED ORDER — ALBUTEROL SULFATE HFA 108 (90 BASE) MCG/ACT IN AERS: 2.0000 | INHALATION_SPRAY | RESPIRATORY_TRACT | Status: DC | PRN

## 2023-04-29 MED ORDER — ONDANSETRON 4 MG PO TBDP: 4.0000 mg | ORAL_TABLET | Freq: Three times a day (TID) | ORAL | 0 refills | Status: DC | PRN

## 2023-04-29 MED ORDER — CEFADROXIL 500 MG PO CAPS: 500.0000 mg | ORAL_CAPSULE | Freq: Two times a day (BID) | ORAL | 0 refills | Status: AC

## 2023-04-29 MED ORDER — HYDROCODONE-ACETAMINOPHEN 5-325 MG PO TABS
1.0000 | ORAL_TABLET | Freq: Once | ORAL | Status: AC
  Administered 2023-04-29: 1 via ORAL
  Filled 2023-04-29: qty 1

## 2023-04-29 NOTE — Discharge Instructions (Addendum)
 As we discussed, your work-up in the ER was reassuring for acute findings. Laboratory evaluation, chest x-ray, and EKG did not reveal any emergent cause of your symptoms. It did reveal that you have a UTI. This is likely due to not correctly taking your Keflex that you were prescribed last time. Given this, I have prescribed you different antibiotics to take as prescribed in its entirety. Do not take any more of the Keflex. I have also given you a prescription for Zofran to take as prescribed as needed for nausea. Please call your pcp at your earliest convenience to schedule an appointment for follow-up.   Return if development of any new or worsening symptoms.

## 2023-04-29 NOTE — ED Provider Notes (Signed)
 [place] [facility]GENC[facility] DEPARTMENT AT [facility] Provider Note   CSN: [id number] Arrival date & time: 04/29/23  7829     History  Chief Complaint  Patient presents with   Palpitations    [name] is a 63 y.o. female.  Patient with history of atrial flutter, GERD, hypertension, hyperlipidemia, morbid obesity, PE on Eliquis presents today with complaints of palpitations. She states that same have been occurring intermittently for the past few months. States she has these intervals where she feels like her heart is racing, she feels head pressure, and she has some shortness of breath. She denies any symptoms currently. She is complaint with her Eliquis. Has never had chest pain. Does note that her right leg is slightly swollen but this is normal for her and has been ongoing for many years and she states is only because she has been sitting in a chair all day without her legs being elevated. This is not any different today. No current shortness of breath. Endorses intermittent nausea as well without vomiting. These episodes occur a few times a day without discernable trigger and resolve without intervention. They usually last no longer than a few minutes. She is concerned she is going into afib as she has a history of this. Also notes she was seen for this previously and had normal work-up. Was diagnosed with a UTI, states she has been taking her antibiotics sporadically and not as prescribed. She denies any urinary symptoms but states that her urine is more "bubbly" than normal. No flank pain or abdominal pain. Also notes some low back pain, states that this is chronic and treated with hydrocodone by her pcp. States this is also exacerbated by her sitting in a chair all day. Denies loss of bowel or bladder function or saddle paraesthesias.   The history is provided by the patient. No language interpreter was used.  Palpitations      Home Medications Prior to Admission  medications   Medication Sig Start Date End Date Taking? Authorizing Provider  acetaminophen (TYLENOL) 500 MG tablet Take 1 tablet (500 mg total) by mouth every 6 (six) hours as needed for mild pain, fever or headache. Patient taking differently: Take 500 mg by mouth as needed for mild pain (pain score 1-3), fever or headache. 08/17/22   Lonia Blood, MD  albuterol (VENTOLIN HFA) 108 (90 Base) MCG/ACT inhaler INHALE 2 PUFFS INTO THE LUNGS EVERY 6 HOURS AS NEEDED 12/05/22   Corwin Levins, MD  atorvastatin (LIPITOR) 20 MG tablet TAKE 1 TABLET BY MOUTH EVERY DAY 10/09/22   Corwin Levins, MD  calcium carbonate (TUMS - DOSED IN MG ELEMENTAL CALCIUM) 500 MG chewable tablet Chew 1 tablet by mouth daily as needed for indigestion or heartburn.    [provider]  famotidine (PEPCID) 20 MG tablet TAKE 1 TABLET BY MOUTH TWICE A DAY 11/06/22   Cobb, Ruby Cola, NP  LORazepam (ATIVAN) 0.5 MG tablet Take 1 tablet (0.5 mg total) by mouth 2 (two) times daily as needed for anxiety. 02/17/23   Corwin Levins, MD  losartan-hydrochlorothiazide (HYZAAR) 100-25 MG tablet Take 1 tablet by mouth daily. 06/20/22   Corwin Levins, MD  metoprolol succinate (TOPROL-XL) 25 MG 24 hr tablet Take 1 tablet (25 mg total) by mouth daily. 12/19/22   Eustace Pen, PA-C  mometasone-formoterol (DULERA) 100-5 MCG/ACT AERO Inhale 2 puffs into the lungs in the morning and at bedtime. 02/17/23   Corwin Levins, MD  ondansetron (ZOFRAN-ODT) 4 MG disintegrating tablet Take 1 tablet (4 mg total) by mouth every 8 (eight) hours as needed for nausea or vomiting. Patient taking differently: Take 4 mg by mouth as needed for nausea or vomiting. 12/05/22   Corwin Levins, MD  pantoprazole (PROTONIX) 40 MG tablet Take 1 tablet (40 mg total) by mouth daily. 11/19/21   Cobb, Ruby Cola, NP  potassium chloride (KLOR-CON 10) 10 MEQ tablet Take 1 tablet (10 mEq total) by mouth daily. 02/17/23   Corwin Levins, MD  Potassium Citrate 15 MEQ (1620 MG) TBCR  Take 1 tablet by mouth as needed (dehydration). 08/21/22   [provider]  rivaroxaban (XARELTO) 20 MG TABS tablet Take 1 tablet (20 mg total) by mouth daily with supper. 12/19/22   Eustace Pen, PA-C  triamcinolone cream (KENALOG) 0.1 % APPLY 1 APPLICATION TOPICALLY 2 (TWO) TIMES DAILY AS NEEDED (ITCHING). 09/15/22   Corwin Levins, MD      Allergies    Morphine, Shrimp [shellfish allergy], Tramadol, Covid-19 (mrna) vaccine, Diltiazem hcl, and Latex    Review of Systems   Review of Systems  Cardiovascular:  Positive for palpitations.  All other systems reviewed and are negative.   Physical Exam Updated Vital Signs BP (!) 136/90   Pulse (!) 102   Temp 98 F (36.7 C)   Resp 17   Ht 5\' 2"  (1.575 m)   Wt 131 kg   SpO2 94%   BMI 52.82 kg/m  Physical Exam Vitals and nursing note reviewed.  Constitutional:      General: She is not in acute distress.    Appearance: Normal appearance. She is normal weight. She is not ill-appearing, toxic-appearing or diaphoretic.  HENT:     Head: Normocephalic and atraumatic.  Cardiovascular:     Rate and Rhythm: Normal rate and regular rhythm.     Heart sounds: Normal heart sounds.  Pulmonary:     Effort: Pulmonary effort is normal. No respiratory distress.     Breath sounds: Normal breath sounds.  Abdominal:     General: Abdomen is flat.     Palpations: Abdomen is soft.  Musculoskeletal:        General: Normal range of motion.     Cervical back: Normal range of motion.     Comments: No TTP noted to palpation of the cervical, thoracic, or lumbar spine. No stepoffs, lesions, deformity, or overlying skin changes.   Skin:    General: Skin is warm and dry.     Comments: Trace edema noted to the RLE without erythema, warmth, palpable cord. Good distal pulses and sensation.   Neurological:     General: No focal deficit present.     Mental Status: She is alert.  Psychiatric:        Mood and Affect: Mood normal.        Behavior:  Behavior normal.     ED Results / Procedures / Treatments   Labs (all labs ordered are listed, but only abnormal results are displayed) Labs Reviewed  CBC - Abnormal; Notable for the following components:      Result Value   WBC 13.0 (*)    All other components within normal limits  BASIC METABOLIC PANEL - Abnormal; Notable for the following components:   Glucose, Bld 125 (*)    Creatinine, Ser 1.31 (*)    GFR, Estimated 46 (*)    All other components within normal limits  URINALYSIS, ROUTINE W REFLEX MICROSCOPIC -  Abnormal; Notable for the following components:   APPearance CLOUDY (*)    Hgb urine dipstick MODERATE (*)    Protein, ur 30 (*)    Leukocytes,Ua LARGE (*)    Bacteria, UA RARE (*)    All other components within normal limits  TROPONIN I (HIGH SENSITIVITY) - Abnormal; Notable for the following components:   Troponin I (High Sensitivity) 26 (*)    All other components within normal limits  D-DIMER, QUANTITATIVE    EKG EKG Interpretation Date/Time:  Wednesday April 29 2023 09:46:20 EDT Ventricular Rate:  96 PR Interval:  144 QRS Duration:  92 QT Interval:  374 QTC Calculation: 472 R Axis:   -23  Text Interpretation: Normal sinus rhythm Cannot rule out Anterior infarct , age undetermined When compared with ECG of 17-Apr-2023 11:21, PREVIOUS ECG IS PRESENT Confirmed by Alvester Chou [phone]) on 04/29/2023 9:11:54 PM  Radiology DG Chest 2 View Result Date: 04/29/2023 CLINICAL DATA:  Dyspnea. EXAM: CHEST - 2 VIEW COMPARISON:  April 17, 2023. FINDINGS: Stable cardiomegaly. Both lungs are clear. The visualized skeletal structures are unremarkable. IMPRESSION: No active cardiopulmonary disease. Electronically Signed   By: Lupita Raider M.D.   On: 04/29/2023 12:48    Procedures Procedures    Medications Ordered in ED Medications  albuterol (VENTOLIN HFA) 108 (90 Base) MCG/ACT inhaler 2 puff (has no administration in time range)  ondansetron (ZOFRAN-ODT)  disintegrating tablet 4 mg (has no administration in time range)  ondansetron (ZOFRAN-ODT) disintegrating tablet 4 mg (4 mg Oral Given 04/29/23 1218)  HYDROcodone-acetaminophen (NORCO/VICODIN) 5-325 MG per tablet 1 tablet (1 tablet Oral Given 04/29/23 2137)    ED Course/ Medical Decision Making/ A&P                                 Medical Decision Making Amount and/or Complexity of Data Reviewed Labs: ordered. Radiology: ordered.  Risk Prescription drug management.   This patient is a 63 y.o. female who presents to the ED for concern of palpitations, this involves an extensive number of treatment options, and is a complaint that carries with it a high risk of complications and morbidity. The emergent differential diagnosis prior to evaluation includes, but is not limited to,  Cardiac arrhythmias, ACS, CHF, pericarditis, valvular disease, panic/anxiety, ETOH, stimulant use, medication side effect, anemia, hyperthyroidism, pulmonary embolism.    This is not an exhaustive differential.   Past Medical History / Co-morbidities / Social History:  has a past medical history of Abscess of bladder (07/28/2016), Alcohol dependence (HCC), Allergic rhinitis (12/06/2013), Anxiety, Asthma (03/16/2015), Atrial flutter with rapid ventricular response (HCC) (04/01/2020), COLONIC POLYPS, HX OF (04/05/2010), DIVERTICULITIS, HX OF (04/05/2010), DJD (degenerative joint disease), Dysrhythmia, GERD (gastroesophageal reflux disease), Heart murmur, History of kidney stones, Hyperlipidemia, Hypertension, Impaired glucose tolerance (12/06/2013), Morbid obesity with BMI of 50.0-59.9, adult (HCC), Nausea & vomiting (05/24/2022), Peripheral vascular disease (HCC), and Pulmonary embolism (HCC).  Additional history: Chart reviewed. Pertinent results include: seen here on 3/07 for similar symptoms, had overall benign work-up, diagnosed with UTI and prescribed Keflex.  Physical Exam: Physical exam performed. The pertinent  findings include: well appearing, RLE edema with no erythema, warmth, fluctuance, induration, or overlying skin change. Good distal pulses and sensation  Lab Tests: I ordered, and personally interpreted labs.  The pertinent results include:  WBC 13, d dimer 0.42, troponin 26 (chart reviewed trops are always elevated), UA still infectious, likely due to incorrect use  of antibiotics. Creatinine 1.31 up from 1.09 12 days ago.   Imaging Studies: I ordered imaging studies including CXR. I independently visualized and interpreted imaging which showed NAD. I agree with the radiologist interpretation.   Cardiac Monitoring:  The patient was maintained on a cardiac monitor.  My attending physician Dr. Renaye Rakers viewed and interpreted the cardiac monitored which showed an underlying rhythm of: sinus rhythm. I agree with this interpretation.   Medications: I ordered medication including zofran for nausea, norco for chronic back pain.  Reevaluation of the patient after these medicines showed that the patient improved. I have reviewed the patients home medicines and have made adjustments as needed.  Disposition: After consideration of the diagnostic results and the patients response to treatment, I feel that emergency department workup does not suggest an emergent condition requiring admission or immediate intervention beyond what has been performed at this time. The plan is: discharge with close outpatient follow-up and return precautions. Patients work-up is benign, she has been here 13 hours and has been asymptomatic for the duration of her ER stay. She does still have a UTI likely due to medication nonadherance. Will prescribe duracef. Recommend she follow-up closely with her pcp. She has a negative d dimer, therefore doubt DVT/PE. She is also on Eliquis and endorses compliance.  Will send for zofran as needed for nausea. Her troponin was mildly elevated but this is chronic. She has no chest pain. Evaluation and  diagnostic testing in the emergency department does not suggest an emergent condition requiring admission or immediate intervention beyond what has been performed at this time.  Plan for discharge with close PCP follow-up.  Patient is understanding and amenable with plan, educated on red flag symptoms that would prompt immediate return.  Patient discharged in stable condition.  Findings and plan of care discussed with supervising physician Dr. Renaye Rakers who is in agreement.   Final Clinical Impression(s) / ED Diagnoses Final diagnoses:  Palpitations  Acute cystitis with hematuria    Rx / DC Orders ED Discharge Orders          Ordered    cefadroxil (DURICEF) 500 MG capsule  2 times daily        04/29/23 2255    ondansetron (ZOFRAN-ODT) 4 MG disintegrating tablet  Every 8 hours PRN        04/29/23 2256          An After Visit Summary was printed and given to the patient.     Vear Clock 04/29/23 2302    Terald Sleeper, MD 04/29/23 2322

## 2023-04-29 NOTE — ED Triage Notes (Signed)
 Pt states she was seen for same recently where she has head pressure, heart palpitations and SOB. Also having cold hands.

## 2023-04-29 NOTE — ED Notes (Signed)
 Pt requested a medication for nausea. Zofran ordered.

## 2023-04-29 NOTE — ED Notes (Signed)
 RN updated pt on labwork.

## 2023-04-29 NOTE — ED Provider Triage Note (Signed)
 Emergency Medicine Provider Triage Evaluation Note  ELVA MAURO , a 63 y.o. female  was evaluated in triage.  Pt complains of head pressure, heart palpitations, shortness of breath.  History of PE currently on Xarelto  Review of Systems  Positive: Head pressure, heart palpitations, shortness of breath Negative: Weakness, numbness, chest pain, nausea, vomiting, fever, chills  Physical Exam  BP 130/86   Pulse 98   Temp 98.2 F (36.8 C) (Oral)   Resp 17   Ht 5\' 2"  (1.575 m)   Wt 131 kg   SpO2 94%   BMI 52.82 kg/m  Gen:   Awake, no distress   Resp:  Normal effort  MSK:   Moves extremities without difficulty  Other:    Medical Decision Making  Medically screening exam initiated at 2:33 PM.  Appropriate orders placed.  UCHENNA SEUFERT was informed that the remainder of the evaluation will be completed by another provider, this initial triage assessment does not replace that evaluation, and the importance of remaining in the ED until their evaluation is complete.  Labs and imaging ordered   Gretta Began 04/29/23 1435

## 2023-04-29 NOTE — ED Notes (Signed)
 Pt states her BP feels high and requested for recheck. RN rechecked pt BP 140/86

## 2023-04-30 ENCOUNTER — Telehealth: Payer: Self-pay

## 2023-04-30 NOTE — Transitions of Care (Post Inpatient/ED Visit) (Signed)
 04/30/2023  Name: [name] MRN: [id number] DOB: [birth year]  Today's TOC FU Call Status: Today's TOC FU Call Status:: Successful TOC FU Call Completed TOC FU Call Complete Date: 04/30/23 Patient's Name and Date of Birth confirmed.  Transition Care Management Follow-up Telephone Call Date of Discharge: 04/29/23 Discharge Facility: [facility]) Type of Discharge: Emergency Department Reason for ED Visit: Other: (palpitations) How have you been since you were released from the hospital?: Better Any questions or concerns?: No  Items Reviewed: Did you receive and understand the discharge instructions provided?: Yes Medications obtained,verified, and reconciled?: Yes (Medications Reviewed) Any new allergies since your discharge?: No Dietary orders reviewed?: NA Do you have support at home?: Yes People in Home: spouse  Medications Reviewed Today: Medications Reviewed Today     Reviewed by Karena Addison, LPN (Licensed Practical Nurse) on 04/30/23 at 1147  Med List Status: <None>   Medication Order Taking? Sig Documenting Provider Last Dose Status Informant  acetaminophen (TYLENOL) 500 MG tablet [id number] No Take 1 tablet (500 mg total) by mouth every 6 (six) hours as needed for mild pain, fever or headache.  Patient taking differently: Take 500 mg by mouth as needed for mild pain (pain score 1-3), fever or headache.   Lonia Blood, MD 04/17/2023 Active Self  albuterol (VENTOLIN HFA) 108 (90 Base) MCG/ACT inhaler [id number] No INHALE 2 PUFFS INTO THE LUNGS EVERY 6 HOURS AS NEEDED Corwin Levins, MD 04/17/2023 Active   atorvastatin (LIPITOR) 20 MG tablet [id number] No TAKE 1 TABLET BY MOUTH EVERY DAY Corwin Levins, MD 04/17/2023 Active   calcium carbonate (TUMS - DOSED IN MG ELEMENTAL CALCIUM) 500 MG chewable tablet [id number] No Chew 1 tablet by mouth daily as needed for indigestion or heartburn. [provider] 04/17/2023 Active Self  cefadroxil (DURICEF) 500 MG capsule  [id number]  Take 1 capsule (500 mg total) by mouth 2 (two) times daily for 7 days. Vear Clock  Active   famotidine (PEPCID) 20 MG tablet [id number] No TAKE 1 TABLET BY MOUTH TWICE A DAY Cobb, Ruby Cola, NP 04/17/2023 Active   LORazepam (ATIVAN) 0.5 MG tablet [id number] No Take 1 tablet (0.5 mg total) by mouth 2 (two) times daily as needed for anxiety. Corwin Levins, MD 04/16/2023 Active   losartan-hydrochlorothiazide (HYZAAR) 100-25 MG tablet [id number] No Take 1 tablet by mouth daily. Corwin Levins, MD 04/17/2023 Active Self  metoprolol succinate (TOPROL-XL) 25 MG 24 hr tablet [id number] No Take 1 tablet (25 mg total) by mouth daily. Eustace Pen, PA-C 04/17/2023 Active   mometasone-formoterol (DULERA) 100-5 MCG/ACT AERO [id number] No Inhale 2 puffs into the lungs in the morning and at bedtime. Corwin Levins, MD 04/17/2023 Active   ondansetron (ZOFRAN-ODT) 4 MG disintegrating tablet [id number]  Take 1 tablet (4 mg total) by mouth every 8 (eight) hours as needed for nausea or vomiting. Smoot, Shawn Route, PA-C  Active   pantoprazole (PROTONIX) 40 MG tablet [id number] No Take 1 tablet (40 mg total) by mouth daily. Noemi Chapel, NP 04/17/2023 Active Self  potassium chloride (KLOR-CON 10) 10 MEQ tablet [id number] No Take 1 tablet (10 mEq total) by mouth daily. Corwin Levins, MD 04/17/2023 Active   Potassium Citrate 15 MEQ (1620 MG) TBCR [id number] No Take 1 tablet by mouth as needed (dehydration). [provider] 04/17/2023 Active Self  rivaroxaban (XARELTO) 20 MG TABS tablet [id number] No Take 1 tablet (20 mg total) by mouth daily with supper. Eustace Pen, PA-C  04/17/2023 Active   triamcinolone cream (KENALOG) 0.1 % [id number] No APPLY 1 APPLICATION TOPICALLY 2 (TWO) TIMES DAILY AS NEEDED (ITCHING). Corwin Levins, MD Past Month Active             Home Care and Equipment/Supplies: Were Home Health Services Ordered?: NA Any new equipment or medical supplies ordered?: NA  Functional  Questionnaire: Do you need assistance with bathing/showering or dressing?: No Do you need assistance with meal preparation?: No Do you need assistance with eating?: No Do you have difficulty maintaining continence: No Do you need assistance with getting out of bed/getting out of a chair/moving?: No Do you have difficulty managing or taking your medications?: No  Follow up appointments reviewed: PCP Follow-up appointment confirmed?: No (declined states will call back to schedule) MD Provider Line Number:[phone] Given: No Specialist Hospital Follow-up appointment confirmed?: NA Do you need transportation to your follow-up appointment?: No Do you understand care options if your condition(s) worsen?: Yes-patient verbalized understanding    SIGNATURE Karena Addison, LPN [facility] Nurse Health Advisor Direct Dial [phone]

## 2023-05-17 ENCOUNTER — Other Ambulatory Visit: Payer: Self-pay | Admitting: Internal Medicine

## 2023-05-18 ENCOUNTER — Other Ambulatory Visit: Payer: Self-pay

## 2023-05-28 ENCOUNTER — Other Ambulatory Visit: Payer: Self-pay | Admitting: Internal Medicine

## 2023-05-31 ENCOUNTER — Emergency Department (HOSPITAL_COMMUNITY)
Admission: EM | Admit: 2023-05-31 | Discharge: 2023-05-31 | Disposition: A | Discharge status: Home / Self Care | Attending: Emergency Medicine | Admitting: Emergency Medicine

## 2023-05-31 ENCOUNTER — Other Ambulatory Visit: Payer: Self-pay

## 2023-05-31 DIAGNOSIS — N189 Chronic kidney disease, unspecified: Secondary | ICD-10-CM | POA: Insufficient documentation

## 2023-05-31 DIAGNOSIS — R112 Nausea with vomiting, unspecified: Secondary | ICD-10-CM | POA: Diagnosis not present

## 2023-05-31 DIAGNOSIS — F10930 Alcohol use, unspecified with withdrawal, uncomplicated: Secondary | ICD-10-CM | POA: Insufficient documentation

## 2023-05-31 DIAGNOSIS — Z7901 Long term (current) use of anticoagulants: Secondary | ICD-10-CM | POA: Diagnosis not present

## 2023-05-31 DIAGNOSIS — Y904 Blood alcohol level of 80-99 mg/100 ml: Secondary | ICD-10-CM | POA: Insufficient documentation

## 2023-05-31 DIAGNOSIS — R000 Tachycardia, unspecified: Secondary | ICD-10-CM | POA: Diagnosis not present

## 2023-05-31 DIAGNOSIS — Z91040 Latex allergy status: Secondary | ICD-10-CM | POA: Insufficient documentation

## 2023-05-31 DIAGNOSIS — F10239 Alcohol dependence with withdrawal, unspecified: Secondary | ICD-10-CM | POA: Diagnosis not present

## 2023-05-31 LAB — CBC
HCT: 42.8 % (ref 36.0–46.0)
Hemoglobin: 14.1 g/dL (ref 12.0–15.0)
MCH: 31.1 pg (ref 26.0–34.0)
MCHC: 32.9 g/dL (ref 30.0–36.0)
MCV: 94.3 fL (ref 80.0–100.0)
Platelets: 206 10*3/uL (ref 150–400)
RBC: 4.54 MIL/uL (ref 3.87–5.11)
RDW: 14.4 % (ref 11.5–15.5)
WBC: 7.5 10*3/uL (ref 4.0–10.5)
nRBC: 0 % (ref 0.0–0.2)

## 2023-05-31 LAB — LIPASE, BLOOD: Lipase: 31 U/L (ref 11–51)

## 2023-05-31 LAB — COMPREHENSIVE METABOLIC PANEL WITH GFR
ALT: 61 U/L — ABNORMAL HIGH (ref 0–44)
AST: 104 U/L — ABNORMAL HIGH (ref 15–41)
Albumin: 3.4 g/dL — ABNORMAL LOW (ref 3.5–5.0)
Alkaline Phosphatase: 96 U/L (ref 38–126)
Anion gap: 18 — ABNORMAL HIGH (ref 5–15)
BUN: 13 mg/dL (ref 8–23)
CO2: 21 mmol/L — ABNORMAL LOW (ref 22–32)
Calcium: 8.9 mg/dL (ref 8.9–10.3)
Chloride: 99 mmol/L (ref 98–111)
Creatinine, Ser: 1 mg/dL (ref 0.44–1.00)
GFR, Estimated: >60 mL/min (ref >60)
Glucose, Bld: 98 mg/dL (ref 70–99)
Potassium: 3.9 mmol/L (ref 3.5–5.1)
Sodium: 138 mmol/L (ref 135–145)
Total Bilirubin: 1.1 mg/dL (ref 0.0–1.2)
Total Protein: 7.7 g/dL (ref 6.5–8.1)

## 2023-05-31 LAB — ETHANOL: Alcohol, Ethyl (B): 86 mg/dL — ABNORMAL HIGH (ref <10)

## 2023-05-31 LAB — RAPID URINE DRUG SCREEN, HOSP PERFORMED
Amphetamines: NOT DETECTED
Barbiturates: NOT DETECTED
Benzodiazepines: NOT DETECTED
Cocaine: NOT DETECTED
Opiates: NOT DETECTED
Tetrahydrocannabinol: NOT DETECTED

## 2023-05-31 MED ORDER — LORAZEPAM 1 MG PO TABS: 0.0000 mg | ORAL_TABLET | Freq: Four times a day (QID) | ORAL | Status: DC

## 2023-05-31 MED ORDER — LORAZEPAM 2 MG/ML IJ SOLN: 0.0000 mg | Freq: Two times a day (BID) | INTRAMUSCULAR | Status: DC

## 2023-05-31 MED ORDER — ONDANSETRON HCL 4 MG/2ML IJ SOLN
INTRAMUSCULAR | Status: DC
Start: 2023-05-31 — End: 2023-05-31
  Filled 2023-05-31: qty 2

## 2023-05-31 MED ORDER — DIAZEPAM 5 MG PO TABS
5.0000 mg | ORAL_TABLET | Freq: Once | ORAL | Status: AC
  Administered 2023-05-31: 5 mg via ORAL
  Filled 2023-05-31: qty 1

## 2023-05-31 MED ORDER — THIAMINE HCL 100 MG/ML IJ SOLN
100.0000 mg | Freq: Every day | INTRAMUSCULAR | Status: DC
  Administered 2023-05-31: 100 mg via INTRAVENOUS
  Filled 2023-05-31: qty 2

## 2023-05-31 MED ORDER — ONDANSETRON HCL 4 MG/2ML IJ SOLN
4.0000 mg | Freq: Once | INTRAMUSCULAR | Status: AC
  Administered 2023-05-31: 4 mg via INTRAVENOUS

## 2023-05-31 MED ORDER — ONDANSETRON 4 MG PO TBDP
4.0000 mg | ORAL_TABLET | Freq: Once | ORAL | Status: AC
Start: 2023-05-31 — End: 2023-05-31
  Administered 2023-05-31: 4 mg via ORAL
  Filled 2023-05-31: qty 1

## 2023-05-31 MED ORDER — THIAMINE MONONITRATE 100 MG PO TABS: 100.0000 mg | ORAL_TABLET | Freq: Every day | ORAL | Status: DC

## 2023-05-31 MED ORDER — LORAZEPAM 1 MG PO TABS: ORAL_TABLET | ORAL | 0 refills | Status: DC

## 2023-05-31 MED ORDER — LORAZEPAM 2 MG/ML IJ SOLN
0.0000 mg | Freq: Four times a day (QID) | INTRAMUSCULAR | Status: DC
  Administered 2023-05-31: 4 mg via INTRAVENOUS
  Filled 2023-05-31: qty 2

## 2023-05-31 MED ORDER — LORAZEPAM 1 MG PO TABS: 0.0000 mg | ORAL_TABLET | Freq: Two times a day (BID) | ORAL | Status: DC

## 2023-05-31 NOTE — ED Triage Notes (Addendum)
 Pt. Stated, I've been drinking Vodka all week and I've started having N/V . I'm having shakes when I do drink, when Im not drinking I dont have the shakes.. I was wondering if they could keep me a couple of days to get myself together. Ive noticed also when Im clearing my throat I will have some blood in there.

## 2023-05-31 NOTE — ED Notes (Signed)
 Sandwich bag given, pt repositioned

## 2023-05-31 NOTE — Discharge Instructions (Addendum)
 You were seen in the ER for alcohol use disorder.  Start taking the Ativan  taper as prescribed starting tomorrow You are already prescribed Ativan  should not be mixed with this prescription.  Make sure that you restart your home Ativan  once this taper is completed.  If you are running out of your Ativan , please make sure you call your PCP to get refills completed.  Return to the ER if your symptoms get worse.  Please read instructions on alcohol withdrawal syndrome.

## 2023-05-31 NOTE — ED Provider Notes (Signed)
 [place] [facility] Provider Note   CSN: [id number] Arrival date & time: 05/31/23  1018     History  Chief Complaint  Patient presents with   Alcohol Problem   Emesis   Nausea   Abdominal Pain    [name] is a 63 y.o. female.  HPI    63 year old female comes in with chief complaint of alcohol problem, nausea, vomiting, abdominal pain. Patient has history of CKD, alcohol use disorder, alcohol withdrawal with delirium, chronic kidney disease, paroxysmal A-fib, PE.  Patient indicates that she has been drinking regularly ever since her birthday.  She received liquor for her birthday, started drinking it and in the last 2 weeks she went on a girls trip and started drinking heavily every day.  Now she drinks fifth of vodka daily and she drinks day and night.  She wants to quit drinking, but when she does so she starts having tremors.  She was admitted to the hospital in April of last year.  She stayed sober for 6 months before she relapsed.  Patient's last alcoholic beverage was this morning.  Currently she is having primarily shaking and some nausea.  Home Medications Prior to Admission medications   Medication Sig Start Date End Date Taking? Authorizing Provider  LORazepam  (ATIVAN ) 1 MG tablet 2 mg q 8 hours on day 1 and day 2 1 mg q 8 hours on day 3 and day 4 1 mg q 12 hours on day 5 and day 6 1 mg at bedtime on day 7 and 8 05/31/23  Yes Bronwyn Belasco, MD  losartan -hydrochlorothiazide  (HYZAAR) 100-25 MG tablet TAKE 1 TABLET BY MOUTH EVERY DAY 05/18/23   Roslyn Coombe, MD  acetaminophen  (TYLENOL ) 500 MG tablet Take 1 tablet (500 mg total) by mouth every 6 (six) hours as needed for mild pain, fever or headache. Patient taking differently: Take 500 mg by mouth as needed for mild pain (pain score 1-3), fever or headache. 08/17/22   Abbe Abate, MD  albuterol  (VENTOLIN  HFA) 108 220 280 4235 Base) MCG/ACT inhaler INHALE 2 PUFFS INTO THE LUNGS  EVERY 6 HOURS AS NEEDED 12/05/22   Roslyn Coombe, MD  atorvastatin  (LIPITOR) 20 MG tablet TAKE 1 TABLET BY MOUTH EVERY DAY 10/09/22   Roslyn Coombe, MD  calcium  carbonate (TUMS - DOSED IN MG ELEMENTAL CALCIUM ) 500 MG chewable tablet Chew 1 tablet by mouth daily as needed for indigestion or heartburn.    [provider]  famotidine  (PEPCID ) 20 MG tablet TAKE 1 TABLET BY MOUTH TWICE A DAY 11/06/22   Cobb, Mariah Shines, NP  LORazepam  (ATIVAN ) 0.5 MG tablet TAKE 1 TABLET BY MOUTH 2 TIMES DAILY AS NEEDED FOR ANXIETY. 05/28/23   Roslyn Coombe, MD  metoprolol  succinate (TOPROL -XL) 25 MG 24 hr tablet Take 1 tablet (25 mg total) by mouth daily. 12/19/22   Nathanel Bal, PA-C  mometasone -formoterol  (DULERA ) 100-5 MCG/ACT AERO Inhale 2 puffs into the lungs in the morning and at bedtime. 02/17/23   Roslyn Coombe, MD  ondansetron  (ZOFRAN -ODT) 4 MG disintegrating tablet Take 1 tablet (4 mg total) by mouth every 8 (eight) hours as needed for nausea or vomiting. 04/29/23   Smoot, Genevive Ket, PA-C  pantoprazole  (PROTONIX ) 40 MG tablet Take 1 tablet (40 mg total) by mouth daily. 11/19/21   Cobb, Mariah Shines, NP  potassium chloride  (KLOR-CON  10) 10 MEQ tablet Take 1 tablet (10 mEq total) by mouth daily. 02/17/23   Autry Legions,  Alveda Aures, MD  Potassium Citrate 15 MEQ (1620 MG) TBCR Take 1 tablet by mouth as needed (dehydration). 08/21/22   [provider]  rivaroxaban  (XARELTO ) 20 MG TABS tablet Take 1 tablet (20 mg total) by mouth daily with supper. 12/19/22   Nathanel Bal, PA-C  triamcinolone  cream (KENALOG ) 0.1 % APPLY 1 APPLICATION TOPICALLY 2 (TWO) TIMES DAILY AS NEEDED (ITCHING). 09/15/22   Roslyn Coombe, MD      Allergies    Morphine , Shrimp [shellfish allergy], Tramadol , Covid-19 (mrna) vaccine, Diltiazem  hcl, and Latex    Review of Systems   Review of Systems  All other systems reviewed and are negative.   Physical Exam Updated Vital Signs BP (!) 142/84   Pulse (!) 112   Temp 98.8 F (37.1 C) (Oral)    Resp 20   Ht 5\' 1"  (1.549 m)   Wt 133.8 kg   SpO2 94%   BMI 55.74 kg/m  Physical Exam Vitals and nursing note reviewed.  Constitutional:      Appearance: She is well-developed.  HENT:     Head: Normocephalic and atraumatic.  Eyes:     Extraocular Movements: Extraocular movements intact.  Cardiovascular:     Rate and Rhythm: Normal rate.  Pulmonary:     Effort: Pulmonary effort is normal.  Abdominal:     Tenderness: There is no abdominal tenderness.  Musculoskeletal:     Cervical back: Normal range of motion and neck supple.  Skin:    General: Skin is dry.  Neurological:     Mental Status: She is alert and oriented to person, place, and time.     Comments: Bilateral upper extremity tremor noted     ED Results / Procedures / Treatments   Labs (all labs ordered are listed, but only abnormal results are displayed) Labs Reviewed  COMPREHENSIVE METABOLIC PANEL WITH GFR - Abnormal; Notable for the following components:      Result Value   CO2 21 (*)    Albumin 3.4 (*)    AST 104 (*)    ALT 61 (*)    Anion gap 18 (*)    All other components within normal limits  ETHANOL - Abnormal; Notable for the following components:   Alcohol, Ethyl (B) 86 (*)    All other components within normal limits  CBC  RAPID URINE DRUG SCREEN, HOSP PERFORMED  LIPASE, BLOOD    EKG None  Radiology No results found.  Procedures Procedures    Medications Ordered in ED Medications  LORazepam  (ATIVAN ) injection 0-4 mg (4 mg Intravenous Given 05/31/23 1256)    Or  LORazepam  (ATIVAN ) tablet 0-4 mg ( Oral See Alternative 05/31/23 1256)  LORazepam  (ATIVAN ) injection 0-4 mg (has no administration in time range)    Or  LORazepam  (ATIVAN ) tablet 0-4 mg (has no administration in time range)  thiamine  (VITAMIN B1) tablet 100 mg ( Oral See Alternative 05/31/23 1256)    Or  thiamine  (VITAMIN B1) injection 100 mg (100 mg Intravenous Given 05/31/23 1256)  ondansetron  (ZOFRAN ) 4 MG/2ML injection (   Not Given 05/31/23 1300)  diazepam  (VALIUM ) tablet 5 mg (has no administration in time range)  ondansetron  (ZOFRAN -ODT) disintegrating tablet 4 mg (4 mg Oral Given 05/31/23 1049)  ondansetron  (ZOFRAN ) injection 4 mg (4 mg Intravenous Given 05/31/23 1257)    ED Course/ Medical Decision Making/ A&P  Medical Decision Making Amount and/or Complexity of Data Reviewed Labs: ordered.  Risk OTC drugs. Prescription drug management.   This patient presents to the ED with chief complaint(s) of alcohol withdrawal symptoms with pertinent past medical history of alcohol use disorder with complications such as delirium in the past.  She denies any history of seizures, DTs, intubation.The complaint involves an extensive differential diagnosis and also carries with it a high risk of complications and morbidity.    The differential diagnosis includes : Alcohol toxicity, alcohol withdrawal, severe electrolyte abnormality, dehydration  The initial plan is to get basic labs. Nursing staff indicated that patient's CIWA score is over 30, however patient has no tachycardia right now, she had alcoholic beverage earlier today and she is coherent, not responding to internal stimuli.  Patient was pan positive and the nurse did the CIWA score, indicating yes to things like hallucinations, tactile stimulation -which is likely not true given that she just had a beverage today and hemodynamically she is stable, she is lucid and gives me proper history.  Our plan is to get basic labs.  We will give her Ativan  right now.  Will reassess her.     Additional history obtained: Records reviewed previous admission documents  Independent labs interpretation:  The following labs were independently interpreted: Ethanol level is 86.  Rest of the labs are fine.   Treatment and Reassessment: I reassessed the patient after she received Ativan .  Her CIWA score per nursing staff had come down to 17.   On my assessment, patient reports nausea, noted to have tremor, but she had no sweats.  She is slightly anxious, has mild headache and itching.  She is completely oriented.  I had a CIWA score of 8.  She has ambulated.  Patient still nervous about going home.  Husband resides at home with her, but currently out on work.  She is wondering if she can just be admitted to the hospital.  She does not meet inpatient criteria.  I called facility based care.  Psychiatry team went to assess the patient.  At that time, however patient states that she is comfortable going home.  Patient agreed to taking Valium  taper instead of Librium , however patient is already taking Ativan  0.5 mg at home.  She still has some medications at home.  She states that when she drinks, she does not benzos.  I recommend some of her symptoms could also be benzo withdrawal.  We will start her on benzo taper.  She will call her PCP if she needs refills to make sure that they are available.  Return precautions discussed. Choice for transfer to facility based care was also provided, but patient declined.  Behavioral health urgent care information also provided.  Final Clinical Impression(s) / ED Diagnoses Final diagnoses:  Alcohol withdrawal syndrome without complication (HCC)    Rx / DC Orders ED Discharge Orders          Ordered    LORazepam  (ATIVAN ) 1 MG tablet        05/31/23 1712              Deatra Face, MD 05/31/23 1717

## 2023-05-31 NOTE — ED Notes (Signed)
 MD notified of high CIWA score.

## 2023-07-20 ENCOUNTER — Other Ambulatory Visit: Payer: Self-pay | Admitting: Internal Medicine

## 2023-07-21 ENCOUNTER — Telehealth: Payer: Self-pay

## 2023-07-21 ENCOUNTER — Other Ambulatory Visit (HOSPITAL_COMMUNITY): Payer: Self-pay

## 2023-07-21 ENCOUNTER — Other Ambulatory Visit: Payer: Self-pay | Admitting: Internal Medicine

## 2023-07-21 DIAGNOSIS — J4540 Moderate persistent asthma, uncomplicated: Secondary | ICD-10-CM

## 2023-07-21 NOTE — Telephone Encounter (Signed)
 Pharmacy Patient Advocate Encounter   Received notification from Pt Calls Messages that prior authorization for Dulera  100-58mcg is required/requested.   Insurance verification completed.   The patient is insured through [facility] .   Per test claim: PA required; PA submitted to above mentioned insurance via CoverMyMeds Key/confirmation #/EOC [facility] Status is pending

## 2023-07-21 NOTE — Telephone Encounter (Signed)
 Message sent to PA team.

## 2023-07-22 ENCOUNTER — Other Ambulatory Visit (HOSPITAL_COMMUNITY): Payer: Self-pay

## 2023-07-24 ENCOUNTER — Encounter: Payer: Self-pay | Admitting: Podiatry

## 2023-07-24 ENCOUNTER — Ambulatory Visit (INDEPENDENT_AMBULATORY_CARE_PROVIDER_SITE_OTHER): Admitting: Podiatry

## 2023-07-24 ENCOUNTER — Other Ambulatory Visit: Payer: Self-pay | Admitting: Internal Medicine

## 2023-07-24 VITALS — Ht 61.0 in | Wt 295.0 lb

## 2023-07-24 DIAGNOSIS — M79609 Pain in unspecified limb: Secondary | ICD-10-CM

## 2023-07-24 DIAGNOSIS — D689 Coagulation defect, unspecified: Secondary | ICD-10-CM | POA: Diagnosis not present

## 2023-07-24 DIAGNOSIS — B351 Tinea unguium: Secondary | ICD-10-CM

## 2023-07-24 MED ORDER — FLUTICASONE FUROATE-VILANTEROL 200-25 MCG/ACT IN AEPB
1.0000 | INHALATION_SPRAY | Freq: Every day | RESPIRATORY_TRACT | 3 refills | Status: DC
Start: 2023-07-24 — End: 2023-08-06

## 2023-07-24 NOTE — Progress Notes (Signed)
 Subjective:   Patient ID: [name], female   DOB: 63 y.o.   MRN: [id number]   HPI Patient presents stating her nails are really bothering her she cannot reach them and several of them are ingrown but she is nervous to get it done because she had a blood clot   ROS      Objective:  Physical Exam  Neurovascular status intact with patient found to have elongated thickened nailbeds 1-5 both feet and patient is also on blood thinner cannot take care of them and they get painful     Assessment:  Mycotic nail infection with ingrown component     Plan:  H&P reviewed we could consider nail removal but organ to hold off currently due to her being concerned about this and I debrided nailbeds 1-5 both feet with high risk due to blood thinner and obesity and no iatrogenic bleeding and will reappoint as needed for care

## 2023-07-24 NOTE — Telephone Encounter (Signed)
 Pharmacy Patient Advocate Encounter  Received notification from [facility] that Prior Authorization for Dulera  100mcg-5mcg has been DENIED.  See denial reason below. No denial letter attached in CMM. Will attach denial letter to Media tab once received.   PA #/Case ID/Reference #: [id number]    Preferred products: budesonide -formoterol , fluticasone -salmeterol diskus, Breo Ellipta 

## 2023-07-28 NOTE — Telephone Encounter (Signed)
 This was already changed to breo

## 2023-07-29 ENCOUNTER — Encounter: Admitting: Internal Medicine

## 2023-07-31 NOTE — Telephone Encounter (Signed)
 [name]

## 2023-08-06 ENCOUNTER — Encounter: Payer: Self-pay | Admitting: Internal Medicine

## 2023-08-06 ENCOUNTER — Ambulatory Visit (INDEPENDENT_AMBULATORY_CARE_PROVIDER_SITE_OTHER): Admitting: Internal Medicine

## 2023-08-06 ENCOUNTER — Ambulatory Visit: Payer: Self-pay | Admitting: Internal Medicine

## 2023-08-06 VITALS — BP 118/84 | HR 84 | Temp 98.0°F | Ht 61.0 in | Wt 294.0 lb

## 2023-08-06 DIAGNOSIS — R7302 Impaired glucose tolerance (oral): Secondary | ICD-10-CM

## 2023-08-06 DIAGNOSIS — E538 Deficiency of other specified B group vitamins: Secondary | ICD-10-CM

## 2023-08-06 DIAGNOSIS — E7800 Pure hypercholesterolemia, unspecified: Secondary | ICD-10-CM | POA: Diagnosis not present

## 2023-08-06 DIAGNOSIS — Z0001 Encounter for general adult medical examination with abnormal findings: Secondary | ICD-10-CM

## 2023-08-06 DIAGNOSIS — Z0000 Encounter for general adult medical examination without abnormal findings: Secondary | ICD-10-CM

## 2023-08-06 DIAGNOSIS — K219 Gastro-esophageal reflux disease without esophagitis: Secondary | ICD-10-CM

## 2023-08-06 DIAGNOSIS — E559 Vitamin D deficiency, unspecified: Secondary | ICD-10-CM

## 2023-08-06 DIAGNOSIS — I10 Essential (primary) hypertension: Secondary | ICD-10-CM | POA: Diagnosis not present

## 2023-08-06 DIAGNOSIS — Z1211 Encounter for screening for malignant neoplasm of colon: Secondary | ICD-10-CM

## 2023-08-06 LAB — BASIC METABOLIC PANEL WITH GFR
BUN: 24 mg/dL — ABNORMAL HIGH (ref 6–23)
CO2: 28 meq/L (ref 19–32)
Calcium: 9.1 mg/dL (ref 8.4–10.5)
Chloride: 102 meq/L (ref 96–112)
Creatinine, Ser: 1.17 mg/dL (ref 0.40–1.20)
GFR: 50.07 mL/min — ABNORMAL LOW (ref >60.00)
Glucose, Bld: 99 mg/dL (ref 70–99)
Potassium: 3.5 meq/L (ref 3.5–5.1)
Sodium: 139 meq/L (ref 135–145)

## 2023-08-06 LAB — LIPID PANEL
Cholesterol: 180 mg/dL (ref 0–200)
HDL: 47.3 mg/dL (ref >39.00)
LDL Cholesterol: 113 mg/dL — ABNORMAL HIGH (ref 0–99)
NonHDL: 132.28
Total CHOL/HDL Ratio: 4
Triglycerides: 97 mg/dL (ref 0.0–149.0)
VLDL: 19.4 mg/dL (ref 0.0–40.0)

## 2023-08-06 LAB — CBC WITH DIFFERENTIAL/PLATELET
Basophils Absolute: 0 10*3/uL (ref 0.0–0.1)
Basophils Relative: 0.4 % (ref 0.0–3.0)
Eosinophils Absolute: 0.2 10*3/uL (ref 0.0–0.7)
Eosinophils Relative: 2.1 % (ref 0.0–5.0)
HCT: 41 % (ref 36.0–46.0)
Hemoglobin: 13.2 g/dL (ref 12.0–15.0)
Lymphocytes Relative: 15.3 % (ref 12.0–46.0)
Lymphs Abs: 1.8 10*3/uL (ref 0.7–4.0)
MCHC: 32.2 g/dL (ref 30.0–36.0)
MCV: 93.1 fl (ref 78.0–100.0)
Monocytes Absolute: 0.8 10*3/uL (ref 0.1–1.0)
Monocytes Relative: 6.8 % (ref 3.0–12.0)
Neutro Abs: 8.7 10*3/uL — ABNORMAL HIGH (ref 1.4–7.7)
Neutrophils Relative %: 75.4 % (ref 43.0–77.0)
Platelets: 230 10*3/uL (ref 150.0–400.0)
RBC: 4.4 Mil/uL (ref 3.87–5.11)
RDW: 15.4 % (ref 11.5–15.5)
WBC: 11.6 10*3/uL — ABNORMAL HIGH (ref 4.0–10.5)

## 2023-08-06 LAB — HEPATIC FUNCTION PANEL
ALT: 47 U/L — ABNORMAL HIGH (ref 0–35)
AST: 64 U/L — ABNORMAL HIGH (ref 0–37)
Albumin: 3.7 g/dL (ref 3.5–5.2)
Alkaline Phosphatase: 102 U/L (ref 39–117)
Bilirubin, Direct: 0.2 mg/dL (ref 0.0–0.3)
Total Bilirubin: 0.4 mg/dL (ref 0.2–1.2)
Total Protein: 7.4 g/dL (ref 6.0–8.3)

## 2023-08-06 LAB — TSH: TSH: 3.23 u[IU]/mL (ref 0.35–5.50)

## 2023-08-06 LAB — VITAMIN D 25 HYDROXY (VIT D DEFICIENCY, FRACTURES): VITD: <7 ng/mL — ABNORMAL LOW (ref 30.00–100.00)

## 2023-08-06 LAB — HEMOGLOBIN A1C: Hgb A1c MFr Bld: 6.3 % (ref 4.6–6.5)

## 2023-08-06 LAB — VITAMIN B12: Vitamin B-12: 288 pg/mL (ref 211–911)

## 2023-08-06 MED ORDER — FAMOTIDINE 20 MG PO TABS: 20.0000 mg | ORAL_TABLET | Freq: Two times a day (BID) | ORAL | 3 refills | Status: DC

## 2023-08-06 MED ORDER — LORAZEPAM 0.5 MG PO TABS: 0.5000 mg | ORAL_TABLET | Freq: Two times a day (BID) | ORAL | 2 refills | Status: DC | PRN

## 2023-08-06 MED ORDER — FLUTICASONE FUROATE-VILANTEROL 200-25 MCG/ACT IN AEPB: 1.0000 | INHALATION_SPRAY | Freq: Every day | RESPIRATORY_TRACT | 3 refills | Status: DC

## 2023-08-06 MED ORDER — ALBUTEROL SULFATE HFA 108 (90 BASE) MCG/ACT IN AERS: INHALATION_SPRAY | RESPIRATORY_TRACT | 5 refills | Status: DC

## 2023-08-06 MED ORDER — LOSARTAN POTASSIUM-HCTZ 100-25 MG PO TABS: 1.0000 | ORAL_TABLET | Freq: Every day | ORAL | 8 refills | Status: DC

## 2023-08-06 MED ORDER — ATORVASTATIN CALCIUM 20 MG PO TABS: 20.0000 mg | ORAL_TABLET | Freq: Every day | ORAL | 3 refills | Status: DC

## 2023-08-06 MED ORDER — POTASSIUM CHLORIDE ER 10 MEQ PO TBCR: 10.0000 meq | EXTENDED_RELEASE_TABLET | Freq: Every day | ORAL | 3 refills | Status: DC

## 2023-08-06 NOTE — Patient Instructions (Signed)
Please continue all other medications as before, and refills have been done if requested.  Please have the pharmacy call with any other refills you may need.  Please continue your efforts at being more active, low cholesterol diet, and weight control.  You are otherwise up to date with prevention measures today.  Please keep your appointments with your specialists as you may have planned  You will be contacted regarding the referral for: colonoscopy  Please go to the LAB at the blood drawing area for the tests to be done  You will be contacted by phone if any changes need to be made immediately.  Otherwise, you will receive a letter about your results with an explanation, but please check with MyChart first.  Please make an Appointment to return for your 1 year visit, or sooner if needed

## 2023-08-06 NOTE — Progress Notes (Signed)
 Patient ID: [name], female   DOB: [birth year], 63 y.o.   MRN: [id number]         Chief Complaint:: wellness exam and low vit d, hyperglycemia, hld, gerd, htn       HPI:  [name] is a 63 y.o. female here for wellness exam; for shingrix at pharmacy, declines prevnar 20, for colonoscopy, o/w up to date                        Also Pt denies chest pain, increased sob or doe, wheezing, orthopnea, PND, increased LE swelling, palpitations, dizziness or syncope.   Pt denies polydipsia, polyuria, or new focal neuro s/s.    Pt denies fever, wt loss, night sweats, loss of appetite, or other constitutional symptoms     Wt Readings from Last 3 Encounters:  08/06/23 294 lb (133.4 kg)  07/24/23 295 lb (133.8 kg)  05/31/23 295 lb (133.8 kg)   BP Readings from Last 3 Encounters:  08/06/23 118/84  05/31/23 (!) 143/78  04/29/23 133/76   Immunization History  Administered Date(s) Administered   PFIZER(Purple Top)SARS-COV-2 Vaccination 04/25/2019, 05/18/2019, 01/02/2020   Td 02/10/2002   Health Maintenance Due  Topic Date Due   Pneumococcal Vaccine 15-2 Years old (1 of 2 - PCV) Never done   Zoster Vaccines- Shingrix (1 of 2) Never done   Colonoscopy  07/25/2021      Past Medical History:  Diagnosis Date   Abscess of bladder 07/28/2016   Alcohol dependence (HCC)    Allergic rhinitis 12/06/2013   Anxiety    Asthma 03/16/2015   Atrial flutter with rapid ventricular response (HCC) 04/01/2020   COLONIC POLYPS, HX OF 04/05/2010   DIVERTICULITIS, HX OF 04/05/2010   DJD (degenerative joint disease)    right knee, mot to severe   Dysrhythmia    GERD (gastroesophageal reflux disease)    no meds   Heart murmur    hx of    History of kidney stones    Hyperlipidemia    Hypertension    Impaired glucose tolerance 12/06/2013   Morbid obesity with BMI of 50.0-59.9, adult (HCC)    Nausea & vomiting 05/24/2022   Peripheral vascular disease (HCC)    Pulmonary embolism (HCC)    Past  Surgical History:  Procedure Laterality Date   ABDOMINAL HYSTERECTOMY  age 34   fibroids   BREAST BIOPSY Left    COLONOSCOPY WITH PROPOFOL  N/A 07/25/2016   Procedure: COLONOSCOPY WITH PROPOFOL ;  Surgeon: Rollin Dover, MD;  Location: [facility];  Service: Endoscopy;  Laterality: N/A;   colonscopy     x 2   CYSTOSCOPY W/ URETERAL STENT PLACEMENT Right 09/05/2021   Procedure: CYSTOSCOPY WITH RETROGRADE PYELOGRAM/URETERAL STENT PLACEMENT;  Surgeon: Selma Donnice SAUNDERS, MD;  Location: [facility];  Service: Urology;  Laterality: Right;   CYSTOSCOPY WITH RETROGRADE PYELOGRAM, URETEROSCOPY AND STENT PLACEMENT Left 08/14/2022   Procedure: CYSTOSCOPY WITH RETROGRADE PYELOGRAM;  Surgeon: Selma Donnice SAUNDERS, MD;  Location[facility] OR;  Service: Urology;  Laterality: Left;   CYSTOSCOPY WITH URETEROSCOPY AND STENT PLACEMENT Left 08/14/2022   Procedure: URETEROSCOPY AND STENT PLACEMENT;  Surgeon: Selma Donnice SAUNDERS, MD;  Location: [facility] OR;  Service: Urology;  Laterality: Left;   CYSTOSCOPY/URETEROSCOPY/HOLMIUM LASER/STENT PLACEMENT Right 09/30/2021   Procedure: CYSTOSCOPY/ RETROGRADE/URETEROSCOPY/HOLMIUM LASER/STENT PLACEMENT;  Surgeon: Selma Donnice SAUNDERS, MD;  Location: [facility];  Service: Urology;  Laterality: Right;   CYSTOSCOPY/URETEROSCOPY/HOLMIUM LASER/STENT PLACEMENT Left 09/12/2022   Procedure: CYSTOSCOPY/LEFT RETROGRADE PYELOGRAM/LEFT  URETEROSCOPY/HOLMIUM LASER/LEFT STENT EXCHANGE;  Surgeon: Selma Donnice SAUNDERS, MD;  Location: [facility];  Service: Urology;  Laterality: Left;  75 MINUTES NEEDED FOR CASE   IR RADIOLOGIST EVAL & MGMT  08/12/2016   IR RADIOLOGIST EVAL & MGMT  08/21/2016   KNEE ARTHROSCOPY     left    TOTAL KNEE ARTHROPLASTY  07/29/2011   Procedure: TOTAL KNEE ARTHROPLASTY;  Surgeon: Donnice JONETTA Car, MD;  Location: [facility];  Service: Orthopedics;  Laterality: Right;   TOTAL KNEE ARTHROPLASTY Left 12/14/2012   Procedure: LEFT TOTAL KNEE ARTHROPLASTY;  Surgeon: Donnice JONETTA Car, MD;  Location: [facility];  Service: Orthopedics;   Laterality: Left;    reports that she quit smoking about 40 years ago. Her smoking use included cigarettes. She started smoking about 47 years ago. She has a 3.5 pack-year smoking history. She has never used smokeless tobacco. She reports current alcohol use of about 1.0 standard drink of alcohol per week. She reports that she does not currently use drugs after having used the following drugs: Marijuana. family history includes Alcoholism in her sister; COPD in her father; Lymphoma in her sister; Stroke in her mother. Allergies  Allergen Reactions   Morphine  Itching   Shrimp [Shellfish Allergy] Itching and Other (See Comments)    Tongue burns also   Tramadol  Other (See Comments)    Caused confusion   Covid-19 (Mrna) Vaccine Hives   Diltiazem  Hcl Itching    Pt with itching of the feet when bolus given   Latex Rash   Current Outpatient Medications on File Prior to Visit  Medication Sig Dispense Refill   acetaminophen  (TYLENOL ) 500 MG tablet Take 1 tablet (500 mg total) by mouth every 6 (six) hours as needed for mild pain, fever or headache. (Patient taking differently: Take 500 mg by mouth as needed for mild pain (pain score 1-3), fever or headache.) 30 tablet 0   calcium  carbonate (TUMS - DOSED IN MG ELEMENTAL CALCIUM ) 500 MG chewable tablet Chew 1 tablet by mouth daily as needed for indigestion or heartburn.     metoprolol  succinate (TOPROL -XL) 25 MG 24 hr tablet Take 1 tablet (25 mg total) by mouth daily. 30 tablet 11   ondansetron  (ZOFRAN -ODT) 4 MG disintegrating tablet TAKE 1 TABLET BY MOUTH EVERY 8 HOURS AS NEEDED FOR NAUSEA AND VOMITING 18 tablet 3   Potassium Citrate 15 MEQ (1620 MG) TBCR Take 1 tablet by mouth as needed (dehydration).     rivaroxaban  (XARELTO ) 20 MG TABS tablet Take 1 tablet (20 mg total) by mouth daily with supper. 30 tablet 6   triamcinolone  cream (KENALOG ) 0.1 % APPLY 1 APPLICATION TOPICALLY 2 (TWO) TIMES DAILY AS NEEDED (ITCHING). 30 g 2   No current  facility-administered medications on file prior to visit.        ROS:  All others reviewed and negative.  Objective        PE:  BP 118/84 (BP Location: Left Arm, Patient Position: Sitting, Cuff Size: Normal)   Pulse 84   Temp 98 F (36.7 C) (Oral)   Ht 5' 1 (1.549 m)   Wt 294 lb (133.4 kg)   SpO2 99%   BMI 55.55 kg/m                 Constitutional: Pt appears in NAD               HENT: Head: NCAT.  Right Ear: External ear normal.                 Left Ear: External ear normal.                Eyes: . Pupils are equal, round, and reactive to light. Conjunctivae and EOM are normal               Nose: without d/c or deformity               Neck: Neck supple. Gross normal ROM               Cardiovascular: Normal rate and regular rhythm.                 Pulmonary/Chest: Effort normal and breath sounds without rales or wheezing.                Abd:  Soft, NT, ND, + BS, no organomegaly               Neurological: Pt is alert. At baseline orientation, motor grossly intact               Skin: Skin is warm. No rashes, no other new lesions, LE edema - none               Psychiatric: Pt behavior is normal without agitation   Micro: none  Cardiac tracings I have personally interpreted today:  none  Pertinent Radiological findings (summarize): none   Lab Results  Component Value Date   WBC 11.6 (H) 08/06/2023   HGB 13.2 08/06/2023   HCT 41.0 08/06/2023   PLT 230.0 08/06/2023   GLUCOSE 99 08/06/2023   CHOL 180 08/06/2023   TRIG 97.0 08/06/2023   HDL 47.30 08/06/2023   LDLDIRECT 107.0 02/23/2012   LDLCALC 113 (H) 08/06/2023   ALT 47 (H) 08/06/2023   AST 64 (H) 08/06/2023   NA 139 08/06/2023   K 3.5 08/06/2023   CL 102 08/06/2023   CREATININE 1.17 08/06/2023   BUN 24 (H) 08/06/2023   CO2 28 08/06/2023   TSH 3.23 08/06/2023   INR 1.1 08/14/2022   HGBA1C 6.3 08/06/2023   Assessment/Plan:  SAMARIA ANES is a 63 y.o. Black or African American [2] female with   has a past medical history of Abscess of bladder (07/28/2016), Alcohol dependence (HCC), Allergic rhinitis (12/06/2013), Anxiety, Asthma (03/16/2015), Atrial flutter with rapid ventricular response (HCC) (04/01/2020), COLONIC POLYPS, HX OF (04/05/2010), DIVERTICULITIS, HX OF (04/05/2010), DJD (degenerative joint disease), Dysrhythmia, GERD (gastroesophageal reflux disease), Heart murmur, History of kidney stones, Hyperlipidemia, Hypertension, Impaired glucose tolerance (12/06/2013), Morbid obesity with BMI of 50.0-59.9, adult (HCC), Nausea & vomiting (05/24/2022), Peripheral vascular disease (HCC), and Pulmonary embolism (HCC).  Encounter for well adult exam with abnormal findings Age and sex appropriate education and counseling updated with regular exercise and diet Referrals for preventative services - for colonoscopy Immunizations addressed - declines prevnar 20, for shingrix at pharmacy Smoking counseling  - none needed Evidence for depression or other mood disorder - none significant Most recent labs reviewed. I have personally reviewed and have noted: 1) the patient's medical and social history 2) The patient's current medications and supplements 3) The patient's height, weight, and BMI have been recorded in the chart   Vitamin D  deficiency Last vitamin D  Lab Results  Component Value Date   VD25OH <7.00 (L) 08/06/2023   Low, to start oral replacement   Impaired glucose tolerance Lab Results  Component Value Date   HGBA1C 6.3 08/06/2023   Stable, pt to continue current medical treatment  - diet wt control   Hyperlipidemia Lab Results  Component Value Date   LDLCALC 113 (H) 08/06/2023   uncontrolled, pt to restart lipitor 20 mg qd   GERD Stable, continue protonix  40 qd  Essential hypertension BP Readings from Last 3 Encounters:  08/06/23 118/84  05/31/23 (!) 143/78  04/29/23 133/76   Stable, pt to continue medical treatment toprol  xl 25 every day, hyzaar 100 25 mg  qd  Followup: Return in about 1 year (around 08/05/2024).  Lynwood Rush, MD 08/09/2023 12:47 PM [facility]

## 2023-08-09 ENCOUNTER — Encounter: Payer: Self-pay | Admitting: Internal Medicine

## 2023-08-09 NOTE — Assessment & Plan Note (Signed)
Stable, continue protonix 40 qd

## 2023-08-09 NOTE — Assessment & Plan Note (Signed)
 Lab Results  Component Value Date   LDLCALC 113 (H) 08/06/2023   uncontrolled, pt to restart lipitor 20 mg qd

## 2023-08-09 NOTE — Assessment & Plan Note (Signed)
 Age and sex appropriate education and counseling updated with regular exercise and diet Referrals for preventative services - for colonoscopy Immunizations addressed - declines prevnar 20, for shingrix at pharmacy Smoking counseling  - none needed Evidence for depression or other mood disorder - none significant Most recent labs reviewed. I have personally reviewed and have noted: 1) the patient's medical and social history 2) The patient's current medications and supplements 3) The patient's height, weight, and BMI have been recorded in the chart

## 2023-08-09 NOTE — Assessment & Plan Note (Signed)
 Last vitamin D  Lab Results  Component Value Date   VD25OH <7.00 (L) 08/06/2023   Low, to start oral replacement

## 2023-08-09 NOTE — Assessment & Plan Note (Signed)
 Lab Results  Component Value Date   HGBA1C 6.3 08/06/2023   Stable, pt to continue current medical treatment  - diet wt control

## 2023-08-09 NOTE — Assessment & Plan Note (Signed)
 BP Readings from Last 3 Encounters:  08/06/23 118/84  05/31/23 (!) 143/78  04/29/23 133/76   Stable, pt to continue medical treatment toprol  xl 25 every day, hyzaar 100 25 mg qd

## 2023-08-17 ENCOUNTER — Emergency Department (HOSPITAL_COMMUNITY)
Admission: EM | Admit: 2023-08-17 | Discharge: 2023-08-17 | Disposition: A | Discharge status: Home / Self Care | Payer: Self-pay

## 2023-08-17 ENCOUNTER — Other Ambulatory Visit: Payer: Self-pay

## 2023-08-17 ENCOUNTER — Encounter (HOSPITAL_COMMUNITY): Payer: Self-pay

## 2023-08-17 ENCOUNTER — Telehealth: Payer: Self-pay | Admitting: Internal Medicine

## 2023-08-17 DIAGNOSIS — F1010 Alcohol abuse, uncomplicated: Secondary | ICD-10-CM | POA: Insufficient documentation

## 2023-08-17 LAB — URINALYSIS, ROUTINE W REFLEX MICROSCOPIC
Bilirubin Urine: NEGATIVE
Glucose, UA: NEGATIVE mg/dL
Ketones, ur: 20 mg/dL — AB
Nitrite: POSITIVE — AB
Protein, ur: 100 mg/dL — AB
Specific Gravity, Urine: 1.019 (ref 1.005–1.030)
WBC, UA: >50 WBC/hpf (ref 0–5)
pH: 5 (ref 5.0–8.0)

## 2023-08-17 LAB — CBC
HCT: 43.4 % (ref 36.0–46.0)
Hemoglobin: 14.1 g/dL (ref 12.0–15.0)
MCH: 30.7 pg (ref 26.0–34.0)
MCHC: 32.5 g/dL (ref 30.0–36.0)
MCV: 94.6 fL (ref 80.0–100.0)
Platelets: 254 K/uL (ref 150–400)
RBC: 4.59 MIL/uL (ref 3.87–5.11)
RDW: 14.2 % (ref 11.5–15.5)
WBC: 8.2 K/uL (ref 4.0–10.5)
nRBC: 0.2 % (ref 0.0–0.2)

## 2023-08-17 LAB — COMPREHENSIVE METABOLIC PANEL WITH GFR
ALT: 67 U/L — ABNORMAL HIGH (ref 0–44)
AST: 115 U/L — ABNORMAL HIGH (ref 15–41)
Albumin: 3.3 g/dL — ABNORMAL LOW (ref 3.5–5.0)
Alkaline Phosphatase: 108 U/L (ref 38–126)
Anion gap: 19 — ABNORMAL HIGH (ref 5–15)
BUN: 14 mg/dL (ref 8–23)
CO2: 20 mmol/L — ABNORMAL LOW (ref 22–32)
Calcium: 9.1 mg/dL (ref 8.9–10.3)
Chloride: 100 mmol/L (ref 98–111)
Creatinine, Ser: 1.05 mg/dL — ABNORMAL HIGH (ref 0.44–1.00)
GFR, Estimated: >60 mL/min (ref >60)
Glucose, Bld: 91 mg/dL (ref 70–99)
Potassium: 4.1 mmol/L (ref 3.5–5.1)
Sodium: 139 mmol/L (ref 135–145)
Total Bilirubin: 1 mg/dL (ref 0.0–1.2)
Total Protein: 7.2 g/dL (ref 6.5–8.1)

## 2023-08-17 LAB — BASIC METABOLIC PANEL WITH GFR
Anion gap: 19 — ABNORMAL HIGH (ref 5–15)
BUN: 14 mg/dL (ref 8–23)
CO2: 23 mmol/L (ref 22–32)
Calcium: 9.2 mg/dL (ref 8.9–10.3)
Chloride: 96 mmol/L — ABNORMAL LOW (ref 98–111)
Creatinine, Ser: 0.96 mg/dL (ref 0.44–1.00)
GFR, Estimated: >60 mL/min (ref >60)
Glucose, Bld: 70 mg/dL (ref 70–99)
Potassium: 4.4 mmol/L (ref 3.5–5.1)
Sodium: 138 mmol/L (ref 135–145)

## 2023-08-17 LAB — LIPASE, BLOOD: Lipase: 29 U/L (ref 11–51)

## 2023-08-17 LAB — I-STAT VENOUS BLOOD GAS, ED
Acid-Base Excess: 1 mmol/L (ref 0.0–2.0)
Bicarbonate: 25.9 mmol/L (ref 20.0–28.0)
Calcium, Ion: 1.09 mmol/L — ABNORMAL LOW (ref 1.15–1.40)
HCT: 44 % (ref 36.0–46.0)
Hemoglobin: 15 g/dL (ref 12.0–15.0)
O2 Saturation: 100 %
Potassium: 4.4 mmol/L (ref 3.5–5.1)
Sodium: 138 mmol/L (ref 135–145)
TCO2: 27 mmol/L (ref 22–32)
pCO2, Ven: 42 mmHg — ABNORMAL LOW (ref 44–60)
pH, Ven: 7.398 (ref 7.25–7.43)
pO2, Ven: 180 mmHg — ABNORMAL HIGH (ref 32–45)

## 2023-08-17 MED ORDER — LORAZEPAM 1 MG PO TABS: 0.0000 mg | ORAL_TABLET | Freq: Four times a day (QID) | ORAL | Status: DC

## 2023-08-17 MED ORDER — THIAMINE HCL 100 MG/ML IJ SOLN
100.0000 mg | Freq: Once | INTRAMUSCULAR | Status: AC
  Administered 2023-08-17: 100 mg via INTRAVENOUS
  Filled 2023-08-17: qty 2

## 2023-08-17 MED ORDER — SODIUM CHLORIDE 0.9 % IV SOLN
1.0000 g | Freq: Once | INTRAVENOUS | Status: AC
  Administered 2023-08-17: 1 g via INTRAVENOUS
  Filled 2023-08-17: qty 10

## 2023-08-17 MED ORDER — CHLORDIAZEPOXIDE HCL 25 MG PO CAPS
25.0000 mg | ORAL_CAPSULE | Freq: Once | ORAL | Status: AC
  Administered 2023-08-17: 25 mg via ORAL
  Filled 2023-08-17: qty 1

## 2023-08-17 MED ORDER — LORAZEPAM 1 MG PO TABS: 0.0000 mg | ORAL_TABLET | Freq: Two times a day (BID) | ORAL | Status: DC

## 2023-08-17 MED ORDER — LORAZEPAM 2 MG/ML IJ SOLN: 0.0000 mg | Freq: Two times a day (BID) | INTRAMUSCULAR | Status: DC

## 2023-08-17 MED ORDER — DEXTROSE 5 % AND 0.9 % NACL IV BOLUS
500.0000 mL | Freq: Once | INTRAVENOUS | Status: AC
  Administered 2023-08-17: 500 mL via INTRAVENOUS

## 2023-08-17 MED ORDER — LORAZEPAM 2 MG/ML IJ SOLN
0.0000 mg | Freq: Four times a day (QID) | INTRAMUSCULAR | Status: DC
  Administered 2023-08-17: 2 mg via INTRAVENOUS
  Filled 2023-08-17: qty 1

## 2023-08-17 MED ORDER — ONDANSETRON HCL 4 MG/2ML IJ SOLN
4.0000 mg | Freq: Once | INTRAMUSCULAR | Status: AC
  Administered 2023-08-17: 4 mg via INTRAVENOUS
  Filled 2023-08-17: qty 2

## 2023-08-17 NOTE — ED Provider Notes (Signed)
 [place] EMERGENCY DEPARTMENT AT [facility] Provider Note   CSN: [id number] Arrival date & time: 08/17/23  9495     Patient presents with: Hematemesis   [name] is a 63 y.o. female.   This is a 63 year old female presenting to the emergency department with small amount of blood in vomit.  Has been drinking heavily for the past several weeks.  Last drink was last night.  She wants to stop drinking.  Feels like she is withdrawing currently.  No history of withdrawal seizures.  Reports some nausea, anxious, feels shaky and says she was seeing bugs crawling on the walls.        Prior to Admission medications   Medication Sig Start Date End Date Taking? Authorizing Provider  acetaminophen  (TYLENOL ) 500 MG tablet Take 1 tablet (500 mg total) by mouth every 6 (six) hours as needed for mild pain, fever or headache. Patient taking differently: Take 500 mg by mouth as needed for mild pain (pain score 1-3), fever or headache. 08/17/22   Danton Reyes DASEN, MD  albuterol  (VENTOLIN  HFA) 108 [phone] Base) MCG/ACT inhaler INHALE 2 PUFFS INTO THE LUNGS EVERY 6 HOURS AS NEEDED 08/06/23   Norleen Lynwood ORN, MD  atorvastatin  (LIPITOR) 20 MG tablet Take 1 tablet (20 mg total) by mouth daily. 08/06/23   Norleen Lynwood ORN, MD  calcium  carbonate (TUMS - DOSED IN MG ELEMENTAL CALCIUM ) 500 MG chewable tablet Chew 1 tablet by mouth daily as needed for indigestion or heartburn.    [provider]  famotidine  (PEPCID ) 20 MG tablet Take 1 tablet (20 mg total) by mouth 2 (two) times daily. 08/06/23   Norleen Lynwood ORN, MD  fluticasone  furoate-vilanterol (BREO ELLIPTA ) 200-25 MCG/ACT AEPB Inhale 1 puff into the lungs daily. 08/06/23   Norleen Lynwood ORN, MD  LORazepam  (ATIVAN ) 0.5 MG tablet Take 1 tablet (0.5 mg total) by mouth 2 (two) times daily as needed for anxiety. 08/06/23   Norleen Lynwood ORN, MD  losartan -hydrochlorothiazide  (HYZAAR) 100-25 MG tablet Take 1 tablet by mouth daily. 08/06/23   Norleen Lynwood ORN, MD   metoprolol  succinate (TOPROL -XL) 25 MG 24 hr tablet Take 1 tablet (25 mg total) by mouth daily. 12/19/22   Terra Fairy PARAS, PA-C  ondansetron  (ZOFRAN -ODT) 4 MG disintegrating tablet TAKE 1 TABLET BY MOUTH EVERY 8 HOURS AS NEEDED FOR NAUSEA AND VOMITING 07/24/23   Webb, Padonda B, FNP  potassium chloride  (KLOR-CON  10) 10 MEQ tablet Take 1 tablet (10 mEq total) by mouth daily. 08/06/23   Norleen Lynwood ORN, MD  Potassium Citrate 15 MEQ (1620 MG) TBCR Take 1 tablet by mouth as needed (dehydration). 08/21/22   [provider]  rivaroxaban  (XARELTO ) 20 MG TABS tablet Take 1 tablet (20 mg total) by mouth daily with supper. 12/19/22   Terra Fairy PARAS, PA-C  triamcinolone  cream (KENALOG ) 0.1 % APPLY 1 APPLICATION TOPICALLY 2 (TWO) TIMES DAILY AS NEEDED (ITCHING). 09/15/22   Norleen Lynwood ORN, MD    Allergies: Morphine , Shrimp [shellfish allergy], Tramadol , Covid-19 (mrna) vaccine, Diltiazem  hcl, and Latex    Review of Systems  Updated Vital Signs BP (!) 159/86   Pulse (!) 104   Temp 98.4 F (36.9 C) (Oral)   Resp 18   Ht 5' 1 (1.549 m)   Wt 133.4 kg   SpO2 96%   BMI 55.56 kg/m   Physical Exam Vitals and nursing note reviewed.  Constitutional:      General: She is not in acute distress.  Appearance: She is not toxic-appearing.  HENT:     Nose: Nose normal.     Mouth/Throat:     Mouth: Mucous membranes are moist.  Eyes:     Conjunctiva/sclera: Conjunctivae normal.  Cardiovascular:     Rate and Rhythm: Normal rate and regular rhythm.  Pulmonary:     Effort: Pulmonary effort is normal.     Breath sounds: Normal breath sounds.  Abdominal:     General: Abdomen is flat. There is no distension.     Tenderness: There is no abdominal tenderness. There is no guarding or rebound.  Skin:    General: Skin is warm and dry.     Capillary Refill: Capillary refill takes less than 2 seconds.  Neurological:     General: No focal deficit present.     Mental Status: She is alert and oriented to  person, place, and time.  Psychiatric:        Mood and Affect: Mood normal.        Behavior: Behavior normal.     (all labs ordered are listed, but only abnormal results are displayed) Labs Reviewed  COMPREHENSIVE METABOLIC PANEL WITH GFR - Abnormal; Notable for the following components:      Result Value   CO2 20 (*)    Creatinine, Ser 1.05 (*)    Albumin 3.3 (*)    AST 115 (*)    ALT 67 (*)    Anion gap 19 (*)    All other components within normal limits  URINALYSIS, ROUTINE W REFLEX MICROSCOPIC - Abnormal; Notable for the following components:   APPearance HAZY (*)    Hgb urine dipstick MODERATE (*)    Ketones, ur 20 (*)    Protein, ur 100 (*)    Nitrite POSITIVE (*)    Leukocytes,Ua MODERATE (*)    Bacteria, UA MANY (*)    All other components within normal limits  BASIC METABOLIC PANEL WITH GFR - Abnormal; Notable for the following components:   Chloride 96 (*)    Anion gap 19 (*)    All other components within normal limits  I-STAT VENOUS BLOOD GAS, ED - Abnormal; Notable for the following components:   pCO2, Ven 42.0 (*)    pO2, Ven 180 (*)    Calcium , Ion 1.09 (*)    All other components within normal limits  LIPASE, BLOOD  CBC    EKG: None  Radiology: No results found.   Procedures   Medications Ordered in the ED  LORazepam  (ATIVAN ) injection 0-4 mg (2 mg Intravenous Given 08/17/23 0818)    Or  LORazepam  (ATIVAN ) tablet 0-4 mg ( Oral See Alternative 08/17/23 0818)  LORazepam  (ATIVAN ) injection 0-4 mg (has no administration in time range)    Or  LORazepam  (ATIVAN ) tablet 0-4 mg (has no administration in time range)  thiamine  (VITAMIN B1) injection 100 mg (100 mg Intravenous Given 08/17/23 0817)  ondansetron  (ZOFRAN ) injection 4 mg (4 mg Intravenous Given 08/17/23 0816)  dextrose  5 % and 0.9% NaCl 5-0.9 % bolus 500 mL (0 mLs Intravenous Stopped 08/17/23 0913)  cefTRIAXone  (ROCEPHIN ) 1 g in sodium chloride  0.9 % 100 mL IVPB (0 g Intravenous Stopped 08/17/23 0959)   chlordiazePOXIDE  (LIBRIUM ) capsule 25 mg (25 mg Oral Given 08/17/23 1056)                                    Medical Decision Making 63 year old female  presents emergency department with report of hematemesis in the setting of heavy alcohol use.  She is afebrile nontachycardic, slightly hypertensive.  Also complaining of alcohol withdrawal.  Last drink was last night.  She is hypertensive, but not tachycardic.  CIWA score 10.  Labs with what I suspect to be alcohol ketoacidosis with bicarb of 20 and a gap of 19 and some ketones in urine.  She had no other metabolic derangements.  She does have mild uptrend in her AST ALT which is seemingly chronically elevated.  She has no anemia.  From what she describes I suspect Mallory-Weiss tear as she notes it is only a few drops of red mixed in with her vomit.  Giving thiamine , added on labs.  CIWA protocol ordered as well.  Patient was given IV fluids.  Bicarb improved.  Symptoms improved with Ativan .  Not in florid DTs or alcohol withdrawal.  Stable vitals.  Given Librium  and discussed follow-up with [facility] for further treatment. Stable for discharge.   Amount and/or Complexity of Data Reviewed Labs: ordered.  Risk Prescription drug management.      Final diagnoses:  Alcohol abuse    ED Discharge Orders     None          Neysa Caron PARAS, DO 08/17/23 1552

## 2023-08-17 NOTE — Discharge Instructions (Addendum)
 Go to the [facility] for help with your alcohol abuse.

## 2023-08-17 NOTE — ED Triage Notes (Signed)
 Pt arrived from home via POV c/o drinking a lot in the past 2 weeks since she lost her job and noticing in the past 2 days some drops of bright red blood with coughing and vomiting. Pt states that her last alcoholic drink was Sunday night approx 2100

## 2023-08-17 NOTE — Telephone Encounter (Unsigned)
 Copied from CRM [id number]. Topic: Clinical - Medication Refill >> Aug 17, 2023  2:41 PM Chiquita SQUIBB wrote: Medication:  ondansetron  ondansetron  (ZOFRAN -ODT) 4 MG disintegrating tablet   Has the patient contacted their pharmacy? Yes (Agent: If no, request that the patient contact the pharmacy for the refill. If patient does not wish to contact the pharmacy document the reason why and proceed with request.) (Agent: If yes, when and what did the pharmacy advise?)  This is the patient's preferred pharmacy:  [id number] - [place], [place] - [address] AT [facility] [address] [place] [address] Phone: [phone] Fax: [phone]  Is this the correct pharmacy for this prescription? Yes If no, delete pharmacy and type the correct one.   Has the prescription been filled recently? No  Is the patient out of the medication? Yes  Has the patient been seen for an appointment in the last year OR does the patient have an upcoming appointment? Yes  Can we respond through MyChart? Yes  Agent: Please be advised that Rx refills may take up to 3 business days. We ask that you follow-up with your pharmacy.

## 2023-08-17 NOTE — ED Notes (Signed)
 Awaiting patient from lobby.

## 2023-08-29 ENCOUNTER — Other Ambulatory Visit: Payer: Self-pay | Admitting: Internal Medicine

## 2023-09-11 DIAGNOSIS — U071 COVID-19: Secondary | ICD-10-CM

## 2023-09-11 HISTORY — DX: COVID-19: U07.1

## 2023-10-04 ENCOUNTER — Inpatient Hospital Stay (HOSPITAL_COMMUNITY)
Admission: EM | Admit: 2023-10-04 | Discharge: 2023-10-08 | DRG: 854 | Disposition: A | Discharge status: Home / Self Care | Payer: Self-pay | Attending: Family Medicine | Admitting: Family Medicine

## 2023-10-04 ENCOUNTER — Emergency Department (HOSPITAL_COMMUNITY): Payer: Self-pay

## 2023-10-04 ENCOUNTER — Emergency Department (HOSPITAL_COMMUNITY): Payer: Self-pay | Admitting: Anesthesiology

## 2023-10-04 ENCOUNTER — Encounter (HOSPITAL_COMMUNITY)
Admission: EM | Disposition: A | Discharge status: Home / Self Care | Payer: Self-pay | Source: Home / Self Care | Attending: Family Medicine

## 2023-10-04 ENCOUNTER — Other Ambulatory Visit: Payer: Self-pay

## 2023-10-04 ENCOUNTER — Encounter (HOSPITAL_COMMUNITY): Payer: Self-pay

## 2023-10-04 DIAGNOSIS — R6520 Severe sepsis without septic shock: Secondary | ICD-10-CM | POA: Diagnosis present

## 2023-10-04 DIAGNOSIS — Z807 Family history of other malignant neoplasms of lymphoid, hematopoietic and related tissues: Secondary | ICD-10-CM

## 2023-10-04 DIAGNOSIS — N138 Other obstructive and reflux uropathy: Secondary | ICD-10-CM | POA: Diagnosis not present

## 2023-10-04 DIAGNOSIS — Z91040 Latex allergy status: Secondary | ICD-10-CM

## 2023-10-04 DIAGNOSIS — N136 Pyonephrosis: Secondary | ICD-10-CM | POA: Diagnosis present

## 2023-10-04 DIAGNOSIS — Z87891 Personal history of nicotine dependence: Secondary | ICD-10-CM | POA: Diagnosis not present

## 2023-10-04 DIAGNOSIS — Z79899 Other long term (current) drug therapy: Secondary | ICD-10-CM | POA: Diagnosis not present

## 2023-10-04 DIAGNOSIS — N322 Vesical fistula, not elsewhere classified: Secondary | ICD-10-CM | POA: Diagnosis not present

## 2023-10-04 DIAGNOSIS — Z87442 Personal history of urinary calculi: Secondary | ICD-10-CM

## 2023-10-04 DIAGNOSIS — Z885 Allergy status to narcotic agent status: Secondary | ICD-10-CM | POA: Diagnosis not present

## 2023-10-04 DIAGNOSIS — N390 Urinary tract infection, site not specified: Secondary | ICD-10-CM | POA: Diagnosis not present

## 2023-10-04 DIAGNOSIS — Z6841 Body Mass Index (BMI) 40.0 and over, adult: Secondary | ICD-10-CM

## 2023-10-04 DIAGNOSIS — Z7901 Long term (current) use of anticoagulants: Secondary | ICD-10-CM | POA: Diagnosis not present

## 2023-10-04 DIAGNOSIS — N132 Hydronephrosis with renal and ureteral calculous obstruction: Secondary | ICD-10-CM | POA: Diagnosis not present

## 2023-10-04 DIAGNOSIS — N202 Calculus of kidney with calculus of ureter: Secondary | ICD-10-CM | POA: Diagnosis not present

## 2023-10-04 DIAGNOSIS — N369 Urethral disorder, unspecified: Secondary | ICD-10-CM | POA: Diagnosis not present

## 2023-10-04 DIAGNOSIS — E872 Acidosis, unspecified: Secondary | ICD-10-CM | POA: Diagnosis present

## 2023-10-04 DIAGNOSIS — Z825 Family history of asthma and other chronic lower respiratory diseases: Secondary | ICD-10-CM

## 2023-10-04 DIAGNOSIS — Z888 Allergy status to other drugs, medicaments and biological substances status: Secondary | ICD-10-CM

## 2023-10-04 DIAGNOSIS — N200 Calculus of kidney: Secondary | ICD-10-CM | POA: Diagnosis not present

## 2023-10-04 DIAGNOSIS — K5900 Constipation, unspecified: Secondary | ICD-10-CM | POA: Diagnosis present

## 2023-10-04 DIAGNOSIS — K760 Fatty (change of) liver, not elsewhere classified: Secondary | ICD-10-CM | POA: Diagnosis present

## 2023-10-04 DIAGNOSIS — N10 Acute tubulo-interstitial nephritis: Secondary | ICD-10-CM | POA: Diagnosis not present

## 2023-10-04 DIAGNOSIS — I129 Hypertensive chronic kidney disease with stage 1 through stage 4 chronic kidney disease, or unspecified chronic kidney disease: Secondary | ICD-10-CM | POA: Diagnosis not present

## 2023-10-04 DIAGNOSIS — I480 Paroxysmal atrial fibrillation: Secondary | ICD-10-CM

## 2023-10-04 DIAGNOSIS — A4151 Sepsis due to Escherichia coli [E. coli]: Secondary | ICD-10-CM | POA: Diagnosis not present

## 2023-10-04 DIAGNOSIS — I700 Atherosclerosis of aorta: Secondary | ICD-10-CM | POA: Diagnosis not present

## 2023-10-04 DIAGNOSIS — K219 Gastro-esophageal reflux disease without esophagitis: Secondary | ICD-10-CM | POA: Diagnosis present

## 2023-10-04 DIAGNOSIS — N201 Calculus of ureter: Secondary | ICD-10-CM

## 2023-10-04 DIAGNOSIS — A419 Sepsis, unspecified organism: Principal | ICD-10-CM | POA: Diagnosis present

## 2023-10-04 DIAGNOSIS — Z823 Family history of stroke: Secondary | ICD-10-CM

## 2023-10-04 DIAGNOSIS — Z8616 Personal history of COVID-19: Secondary | ICD-10-CM

## 2023-10-04 DIAGNOSIS — I10 Essential (primary) hypertension: Secondary | ICD-10-CM | POA: Diagnosis not present

## 2023-10-04 DIAGNOSIS — K5730 Diverticulosis of large intestine without perforation or abscess without bleeding: Secondary | ICD-10-CM | POA: Diagnosis present

## 2023-10-04 DIAGNOSIS — Z96652 Presence of left artificial knee joint: Secondary | ICD-10-CM | POA: Diagnosis present

## 2023-10-04 DIAGNOSIS — N1831 Chronic kidney disease, stage 3a: Secondary | ICD-10-CM | POA: Diagnosis not present

## 2023-10-04 DIAGNOSIS — R000 Tachycardia, unspecified: Secondary | ICD-10-CM | POA: Diagnosis not present

## 2023-10-04 DIAGNOSIS — Z860100 Personal history of colon polyps, unspecified: Secondary | ICD-10-CM

## 2023-10-04 DIAGNOSIS — Z86711 Personal history of pulmonary embolism: Secondary | ICD-10-CM

## 2023-10-04 DIAGNOSIS — N3001 Acute cystitis with hematuria: Secondary | ICD-10-CM

## 2023-10-04 DIAGNOSIS — K8020 Calculus of gallbladder without cholecystitis without obstruction: Secondary | ICD-10-CM | POA: Diagnosis present

## 2023-10-04 DIAGNOSIS — E785 Hyperlipidemia, unspecified: Secondary | ICD-10-CM | POA: Diagnosis not present

## 2023-10-04 DIAGNOSIS — I4892 Unspecified atrial flutter: Secondary | ICD-10-CM | POA: Diagnosis not present

## 2023-10-04 DIAGNOSIS — N179 Acute kidney failure, unspecified: Secondary | ICD-10-CM | POA: Diagnosis present

## 2023-10-04 DIAGNOSIS — Z811 Family history of alcohol abuse and dependence: Secondary | ICD-10-CM

## 2023-10-04 DIAGNOSIS — Z91013 Allergy to seafood: Secondary | ICD-10-CM | POA: Diagnosis not present

## 2023-10-04 DIAGNOSIS — R7989 Other specified abnormal findings of blood chemistry: Secondary | ICD-10-CM | POA: Diagnosis present

## 2023-10-04 DIAGNOSIS — B9620 Unspecified Escherichia coli [E. coli] as the cause of diseases classified elsewhere: Secondary | ICD-10-CM | POA: Diagnosis not present

## 2023-10-04 DIAGNOSIS — F1090 Alcohol use, unspecified, uncomplicated: Secondary | ICD-10-CM | POA: Diagnosis present

## 2023-10-04 DIAGNOSIS — R7881 Bacteremia: Secondary | ICD-10-CM | POA: Diagnosis present

## 2023-10-04 DIAGNOSIS — Z90710 Acquired absence of both cervix and uterus: Secondary | ICD-10-CM

## 2023-10-04 DIAGNOSIS — F419 Anxiety disorder, unspecified: Secondary | ICD-10-CM | POA: Diagnosis present

## 2023-10-04 HISTORY — PX: CYSTOSCOPY W/ URETERAL STENT PLACEMENT: SHX1429

## 2023-10-04 LAB — COMPREHENSIVE METABOLIC PANEL WITH GFR
ALT: 90 U/L — ABNORMAL HIGH (ref 0–44)
AST: 125 U/L — ABNORMAL HIGH (ref 15–41)
Albumin: 3 g/dL — ABNORMAL LOW (ref 3.5–5.0)
Alkaline Phosphatase: 115 U/L (ref 38–126)
Anion gap: 14 (ref 5–15)
BUN: 28 mg/dL — ABNORMAL HIGH (ref 8–23)
CO2: 21 mmol/L — ABNORMAL LOW (ref 22–32)
Calcium: 8.9 mg/dL (ref 8.9–10.3)
Chloride: 104 mmol/L (ref 98–111)
Creatinine, Ser: 2.02 mg/dL — ABNORMAL HIGH (ref 0.44–1.00)
GFR, Estimated: 27 mL/min — ABNORMAL LOW (ref >60)
Glucose, Bld: 162 mg/dL — ABNORMAL HIGH (ref 70–99)
Potassium: 3.9 mmol/L (ref 3.5–5.1)
Sodium: 139 mmol/L (ref 135–145)
Total Bilirubin: 0.8 mg/dL (ref 0.0–1.2)
Total Protein: 7.2 g/dL (ref 6.5–8.1)

## 2023-10-04 LAB — I-STAT CG4 LACTIC ACID, ED: Lactic Acid, Venous: 3.7 mmol/L (ref 0.5–1.9)

## 2023-10-04 LAB — CBC
HCT: 42.4 % (ref 36.0–46.0)
Hemoglobin: 13.2 g/dL (ref 12.0–15.0)
MCH: 30.4 pg (ref 26.0–34.0)
MCHC: 31.1 g/dL (ref 30.0–36.0)
MCV: 97.7 fL (ref 80.0–100.0)
Platelets: 188 K/uL (ref 150–400)
RBC: 4.34 MIL/uL (ref 3.87–5.11)
RDW: 14.2 % (ref 11.5–15.5)
WBC: 24.5 K/uL — ABNORMAL HIGH (ref 4.0–10.5)
nRBC: 0 % (ref 0.0–0.2)

## 2023-10-04 LAB — URINALYSIS, ROUTINE W REFLEX MICROSCOPIC
Bilirubin Urine: NEGATIVE
Glucose, UA: NEGATIVE mg/dL
Ketones, ur: NEGATIVE mg/dL
Nitrite: POSITIVE — AB
Specific Gravity, Urine: 1.02 (ref 1.005–1.030)
pH: 5.5 (ref 5.0–8.0)

## 2023-10-04 LAB — HEMOGLOBIN A1C
Hgb A1c MFr Bld: 6.2 % — ABNORMAL HIGH (ref 4.8–5.6)
Mean Plasma Glucose: 131.24 mg/dL

## 2023-10-04 LAB — URINALYSIS, MICROSCOPIC (REFLEX)

## 2023-10-04 LAB — CBG MONITORING, ED: Glucose-Capillary: 164 mg/dL — ABNORMAL HIGH (ref 70–99)

## 2023-10-04 SURGERY — CYSTOSCOPY, WITH RETROGRADE PYELOGRAM AND URETERAL STENT INSERTION
Anesthesia: General | Laterality: Right

## 2023-10-04 MED ORDER — LIDOCAINE 2% (20 MG/ML) 5 ML SYRINGE
INTRAMUSCULAR | Status: DC | PRN
  Administered 2023-10-04: 100 mg via INTRAVENOUS

## 2023-10-04 MED ORDER — ONDANSETRON HCL 4 MG/2ML IJ SOLN
4.0000 mg | Freq: Once | INTRAMUSCULAR | Status: AC
  Administered 2023-10-04: 4 mg via INTRAVENOUS
  Filled 2023-10-04: qty 2

## 2023-10-04 MED ORDER — METOPROLOL SUCCINATE ER 25 MG PO TB24
25.0000 mg | ORAL_TABLET | Freq: Every day | ORAL | Status: DC
  Administered 2023-10-05 – 2023-10-08 (×4): 25 mg via ORAL
  Filled 2023-10-04 (×4): qty 1

## 2023-10-04 MED ORDER — SUCCINYLCHOLINE CHLORIDE 200 MG/10ML IV SOSY
PREFILLED_SYRINGE | INTRAVENOUS | Status: DC | PRN
  Administered 2023-10-04: 160 mg via INTRAVENOUS

## 2023-10-04 MED ORDER — PHENYLEPHRINE 80 MCG/ML (10ML) SYRINGE FOR IV PUSH (FOR BLOOD PRESSURE SUPPORT)
PREFILLED_SYRINGE | INTRAVENOUS | Status: DC | PRN
  Administered 2023-10-04 (×2): 160 ug via INTRAVENOUS
  Administered 2023-10-04: 80 ug via INTRAVENOUS
  Administered 2023-10-04 (×2): 160 ug via INTRAVENOUS

## 2023-10-04 MED ORDER — LIDOCAINE 2% (20 MG/ML) 5 ML SYRINGE
INTRAMUSCULAR | Status: AC
  Filled 2023-10-04: qty 5

## 2023-10-04 MED ORDER — METRONIDAZOLE 500 MG/100ML IV SOLN
500.0000 mg | Freq: Once | INTRAVENOUS | Status: AC
  Administered 2023-10-04: 500 mg via INTRAVENOUS
  Filled 2023-10-04: qty 100

## 2023-10-04 MED ORDER — LACTATED RINGERS IV SOLN: INTRAVENOUS | Status: AC

## 2023-10-04 MED ORDER — SODIUM CHLORIDE 0.9 % IV SOLN: INTRAVENOUS | Status: DC

## 2023-10-04 MED ORDER — ONDANSETRON HCL 4 MG/2ML IJ SOLN: 4.0000 mg | Freq: Once | INTRAMUSCULAR | Status: DC | PRN

## 2023-10-04 MED ORDER — ACETAMINOPHEN 10 MG/ML IV SOLN
INTRAVENOUS | Status: DC | PRN
Start: 2023-10-04 — End: 2023-10-04
  Administered 2023-10-04: 1000 mg via INTRAVENOUS

## 2023-10-04 MED ORDER — LACTATED RINGERS IV SOLN: INTRAVENOUS | Status: DC | PRN

## 2023-10-04 MED ORDER — PROPOFOL 10 MG/ML IV BOLUS
INTRAVENOUS | Status: AC
  Filled 2023-10-04: qty 20

## 2023-10-04 MED ORDER — MIDAZOLAM HCL 2 MG/2ML IJ SOLN
INTRAMUSCULAR | Status: DC | PRN
  Administered 2023-10-04: 2 mg via INTRAVENOUS

## 2023-10-04 MED ORDER — FENTANYL CITRATE (PF) 250 MCG/5ML IJ SOLN
INTRAMUSCULAR | Status: DC | PRN
  Administered 2023-10-04: 50 ug via INTRAVENOUS

## 2023-10-04 MED ORDER — LACTATED RINGERS IV BOLUS
1000.0000 mL | Freq: Once | INTRAVENOUS | Status: AC
  Administered 2023-10-04: 1000 mL via INTRAVENOUS

## 2023-10-04 MED ORDER — HYDROMORPHONE HCL 1 MG/ML IJ SOLN
0.5000 mg | Freq: Once | INTRAMUSCULAR | Status: AC
  Administered 2023-10-04: 0.5 mg via INTRAVENOUS
  Filled 2023-10-04: qty 1

## 2023-10-04 MED ORDER — FENTANYL CITRATE (PF) 250 MCG/5ML IJ SOLN
INTRAMUSCULAR | Status: AC
  Filled 2023-10-04: qty 5

## 2023-10-04 MED ORDER — PHENYLEPHRINE 80 MCG/ML (10ML) SYRINGE FOR IV PUSH (FOR BLOOD PRESSURE SUPPORT)
PREFILLED_SYRINGE | INTRAVENOUS | Status: AC
  Filled 2023-10-04: qty 10

## 2023-10-04 MED ORDER — VANCOMYCIN HCL 1250 MG/250ML IV SOLN: 1250.0000 mg | INTRAVENOUS | Status: DC

## 2023-10-04 MED ORDER — ONDANSETRON HCL 4 MG/2ML IJ SOLN
INTRAMUSCULAR | Status: DC | PRN
  Administered 2023-10-04: 4 mg via INTRAVENOUS

## 2023-10-04 MED ORDER — MIDAZOLAM HCL 2 MG/2ML IJ SOLN
INTRAMUSCULAR | Status: AC
  Filled 2023-10-04: qty 2

## 2023-10-04 MED ORDER — DEXAMETHASONE SODIUM PHOSPHATE 10 MG/ML IJ SOLN
INTRAMUSCULAR | Status: DC | PRN
  Administered 2023-10-04: 10 mg via INTRAVENOUS

## 2023-10-04 MED ORDER — LORAZEPAM 0.5 MG PO TABS
0.5000 mg | ORAL_TABLET | Freq: Two times a day (BID) | ORAL | Status: DC | PRN
  Administered 2023-10-05 – 2023-10-08 (×7): 0.5 mg via ORAL
  Filled 2023-10-04 (×7): qty 1

## 2023-10-04 MED ORDER — SODIUM CHLORIDE 0.9 % IR SOLN
Status: DC | PRN
  Administered 2023-10-04: 3000 mL

## 2023-10-04 MED ORDER — FENTANYL CITRATE PF 50 MCG/ML IJ SOSY
50.0000 ug | PREFILLED_SYRINGE | Freq: Once | INTRAMUSCULAR | Status: AC
  Administered 2023-10-04: 50 ug via INTRAVENOUS
  Filled 2023-10-04 (×2): qty 1

## 2023-10-04 MED ORDER — ONDANSETRON HCL 4 MG/2ML IJ SOLN
INTRAMUSCULAR | Status: AC
Start: 2023-10-04 — End: 2023-10-04
  Filled 2023-10-04: qty 2

## 2023-10-04 MED ORDER — HYDROMORPHONE HCL 1 MG/ML IJ SOLN
0.5000 mg | Freq: Once | INTRAMUSCULAR | Status: AC
  Administered 2023-10-04: 0.5 mg via INTRAVENOUS

## 2023-10-04 MED ORDER — RIVAROXABAN 20 MG PO TABS
20.0000 mg | ORAL_TABLET | Freq: Every day | ORAL | Status: DC
  Administered 2023-10-05 – 2023-10-07 (×3): 20 mg via ORAL
  Filled 2023-10-04 (×3): qty 1

## 2023-10-04 MED ORDER — IOHEXOL 300 MG/ML  SOLN
INTRAMUSCULAR | Status: DC | PRN
Start: 2023-10-04 — End: 2023-10-04
  Administered 2023-10-04: 6 mL via URETHRAL

## 2023-10-04 MED ORDER — FLUTICASONE FUROATE-VILANTEROL 200-25 MCG/ACT IN AEPB
1.0000 | INHALATION_SPRAY | Freq: Every day | RESPIRATORY_TRACT | Status: DC
  Administered 2023-10-04 – 2023-10-08 (×5): 1 via RESPIRATORY_TRACT
  Filled 2023-10-04: qty 28

## 2023-10-04 MED ORDER — LACTATED RINGERS IV BOLUS
1000.0000 mL | Freq: Once | INTRAVENOUS | Status: AC
Start: 2023-10-04 — End: 2023-10-04
  Administered 2023-10-04: 1000 mL via INTRAVENOUS

## 2023-10-04 MED ORDER — CHLORHEXIDINE GLUCONATE 0.12 % MT SOLN
15.0000 mL | Freq: Once | OROMUCOSAL | Status: AC
  Administered 2023-10-04: 15 mL via OROMUCOSAL

## 2023-10-04 MED ORDER — ATORVASTATIN CALCIUM 10 MG PO TABS
20.0000 mg | ORAL_TABLET | Freq: Every day | ORAL | Status: DC
  Administered 2023-10-04 – 2023-10-08 (×5): 20 mg via ORAL
  Filled 2023-10-04 (×5): qty 2

## 2023-10-04 MED ORDER — ALBUTEROL SULFATE (2.5 MG/3ML) 0.083% IN NEBU: 3.0000 mL | INHALATION_SOLUTION | Freq: Four times a day (QID) | RESPIRATORY_TRACT | Status: DC | PRN

## 2023-10-04 MED ORDER — FENTANYL CITRATE PF 50 MCG/ML IJ SOSY
50.0000 ug | PREFILLED_SYRINGE | Freq: Once | INTRAMUSCULAR | Status: AC
  Administered 2023-10-04: 50 ug via INTRAVENOUS
  Filled 2023-10-04: qty 1

## 2023-10-04 MED ORDER — VANCOMYCIN HCL 1500 MG/300ML IV SOLN
1500.0000 mg | Freq: Once | INTRAVENOUS | Status: AC
  Administered 2023-10-04: 1500 mg via INTRAVENOUS
  Filled 2023-10-04: qty 300

## 2023-10-04 MED ORDER — PROPOFOL 10 MG/ML IV BOLUS
INTRAVENOUS | Status: DC | PRN
  Administered 2023-10-04: 150 mg via INTRAVENOUS

## 2023-10-04 MED ORDER — ROCURONIUM BROMIDE 10 MG/ML (PF) SYRINGE: PREFILLED_SYRINGE | INTRAVENOUS | Status: DC | PRN

## 2023-10-04 MED ORDER — FENTANYL CITRATE (PF) 100 MCG/2ML IJ SOLN: 25.0000 ug | INTRAMUSCULAR | Status: DC | PRN

## 2023-10-04 MED ORDER — SODIUM CHLORIDE 0.9 % IV SOLN
2.0000 g | Freq: Once | INTRAVENOUS | Status: AC
  Administered 2023-10-04: 2 g via INTRAVENOUS
  Filled 2023-10-04: qty 12.5

## 2023-10-04 MED ORDER — ORAL CARE MOUTH RINSE: 15.0000 mL | Freq: Once | OROMUCOSAL | Status: AC

## 2023-10-04 MED ORDER — DIPHENHYDRAMINE HCL 50 MG/ML IJ SOLN
25.0000 mg | Freq: Once | INTRAMUSCULAR | Status: AC
  Administered 2023-10-04: 25 mg via INTRAVENOUS
  Filled 2023-10-04: qty 1

## 2023-10-04 MED ORDER — FENTANYL CITRATE PF 50 MCG/ML IJ SOSY
25.0000 ug | PREFILLED_SYRINGE | INTRAMUSCULAR | Status: AC | PRN
  Administered 2023-10-04 – 2023-10-05 (×3): 25 ug via INTRAVENOUS
  Filled 2023-10-04 (×3): qty 1

## 2023-10-04 MED ORDER — SUCCINYLCHOLINE CHLORIDE 200 MG/10ML IV SOSY
PREFILLED_SYRINGE | INTRAVENOUS | Status: AC
  Filled 2023-10-04: qty 10

## 2023-10-04 MED ORDER — METOPROLOL SUCCINATE ER 25 MG PO TB24: 25.0000 mg | ORAL_TABLET | Freq: Every day | ORAL | Status: DC

## 2023-10-04 MED ORDER — ALBUTEROL SULFATE (2.5 MG/3ML) 0.083% IN NEBU
2.5000 mg | INHALATION_SOLUTION | Freq: Four times a day (QID) | RESPIRATORY_TRACT | Status: DC | PRN
  Filled 2023-10-04: qty 3

## 2023-10-04 MED ORDER — SODIUM CHLORIDE 0.9 % IV SOLN
2.0000 g | Freq: Two times a day (BID) | INTRAVENOUS | Status: DC
  Administered 2023-10-05: 2 g via INTRAVENOUS
  Filled 2023-10-04: qty 12.5

## 2023-10-04 MED ORDER — DEXAMETHASONE SODIUM PHOSPHATE 10 MG/ML IJ SOLN
INTRAMUSCULAR | Status: AC
  Filled 2023-10-04: qty 1

## 2023-10-04 SURGICAL SUPPLY — 16 items
BAG DRAIN URO-CYSTO SKYTR STRL (DRAIN) ×2 IMPLANT
BASKET ZERO TIP NITINOL 2.4FR (BASKET) IMPLANT
CATH URETL OPEN 5X70 (CATHETERS) ×2 IMPLANT
EXTRACTOR STONE 1.7FRX115CM (UROLOGICAL SUPPLIES) IMPLANT
FIBER LASER FLEXIVA 365 (UROLOGICAL SUPPLIES) IMPLANT
FIBER LASER TRACTIP 200 (UROLOGICAL SUPPLIES) IMPLANT
GLOVE BIO SURGEON STRL SZ8 (GLOVE) ×2 IMPLANT
GOWN STRL SURGICAL XL XLNG (GOWN DISPOSABLE) ×2 IMPLANT
GUIDEWIRE ANG ZIPWIRE 038X150 (WIRE) IMPLANT
GUIDEWIRE STR DUAL SENSOR (WIRE) ×2 IMPLANT
KIT TURNOVER KIT B (KITS) IMPLANT
MANIFOLD NEPTUNE II (INSTRUMENTS) ×2 IMPLANT
PACK CYSTO (CUSTOM PROCEDURE TRAY) ×2 IMPLANT
STENT PERCUFLEX 4.8FRX26 (STENTS) IMPLANT
TUBE CONNECTING 12X1/4 (SUCTIONS) ×2 IMPLANT
TUBING UROLOGY SET (TUBING) ×2 IMPLANT

## 2023-10-04 NOTE — Op Note (Signed)
 Preoperative diagnosis: right obstructing ureteral stone Postoperative diagnosis: Same  Procedures performed: Cystoscopy, right retrograde pyelogram with interpretation, right ureteral stent placement  Surgeon: Dr. Steffan Pea  Operative findings: Irregular anatomy of trigone from fistula trigone pulled to the left with the patient.  Tortuous ureter difficult placing stent. 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  Abnormal fistula at the dome of the bladder.  RGP Findings: A retrograde pyelogram was performed, contrast was instilled into the right collecting system and there was a small filling defect in the ureter at the expected location of the stone and proximal hydronephrosis. There were no other abnormalities.  Specimens:right renal pelvis urine culture  Indication: [name] is a 63 y.o. patient with obstructive pyelo that started this morning. After reviewing the management options for treatment, he elected to proceed with the above surgical procedure(s). We have discussed the potential benefits and risks of the procedure, side effects of the proposed treatment, the likelihood of the patient achieving the goals of the procedure, and any potential problems that might occur during the procedure or recuperation. Informed consent has been obtained.  Procedure in detail: Patient was consented prior to being brought back to the operating room. He was then brought back to the operating room placed on the table in supine position. General anesthesia was then induced and endotracheal tube inserted. This then placed in dorsolithotomy position and prepped and draped in the routine sterile fashion. A timeout was held.  Using a 22.5 [place] cystoscope with a 30  lens, I gently passed the scope into the patient's urethra and into the bladder under visual guidance. A 360 cystoscopic evaluation was performed with there appeared to be a fistula in  the left bladder for lateral to the left ureteral orifice.  Also appeared to be an abnormal location of the dome that is purulence mentioned in previous cystoscopies.. I then passed a 0. 038 Sensor wire into the right ureteral orifice and into the right renal pelvis. I then slid a 5 [place] open-ended ureteral catheter over the wire and into the renal pelvis and removed the wire.   I then aspirated 10 cc of urine which was sent for culture. Next I injected 10 cc of contrast into the renal collecting system and performed a retrograde pyelogram. I then replaced the wire through the open-ended ureteral catheter and remove the catheter. I then slid a 26 cm x 4.8 French double-J ureteral stent over the wire and into the renal pelvis under fluoroscopic guidance. Once a nice curl was noted in the renal pelvis I advanced the distal end of the stent into the bladder before removing the wire completely. A final fluoroscopic image was obtained confirming the curl in the renal pelvis as well as a curl in the bladder.  Catheter was placed in bladder at the end of the case.  Disposition: The patient returned to the PACU in stable condition.

## 2023-10-04 NOTE — Anesthesia Procedure Notes (Signed)
 Procedure Name: Intubation Date/Time: 10/04/2023 4:19 PM  Performed by: Roddie Grate, CRNAPre-anesthesia Checklist: Patient identified, Emergency Drugs available, Suction available, Patient being monitored and Timeout performed Patient Re-evaluated:Patient Re-evaluated prior to induction Oxygen Delivery Method: Circle system utilized Preoxygenation: Pre-oxygenation with 100% oxygen Induction Type: IV induction, Rapid sequence and Cricoid Pressure applied Laryngoscope Size: Glidescope and 3 Grade View: Grade I Tube type: Oral Number of attempts: 1 Airway Equipment and Method: Stylet Placement Confirmation: ETT inserted through vocal cords under direct vision, positive ETCO2 and breath sounds checked- equal and bilateral Secured at: 23 cm Tube secured with: Tape Dental Injury: Teeth and Oropharynx as per pre-operative assessment  Comments: Smooth IV Induction. Eyes taped. RSI Performed. DL x 1 with grade 1 view. Atraumatically placed, teeth and lip remain intact as pre-op. Secured with tape. Bilateral breath sounds +/=, EtCO2 +, Adequate TV, VSS.

## 2023-10-04 NOTE — Anesthesia Postprocedure Evaluation (Signed)
 Anesthesia Post Note  Patient: Rojelio DELENA Moats  Procedure(s) Performed: CYSTOSCOPY, WITH RETROGRADE PYELOGRAM AND URETERAL STENT INSERTION (Right)     Patient location during evaluation: PACU Anesthesia Type: General Level of consciousness: awake and alert Pain management: pain level controlled Vital Signs Assessment: post-procedure vital signs reviewed and stable Respiratory status: spontaneous breathing, nonlabored ventilation, respiratory function stable and patient connected to nasal cannula oxygen Cardiovascular status: blood pressure returned to baseline and stable Postop Assessment: no apparent nausea or vomiting Anesthetic complications: no   No notable events documented.  Last Vitals:  Vitals:   10/04/23 1745 10/04/23 1800  BP: 123/78 119/78  Pulse: 97 95  Resp: (!) 21 18  Temp: 36.4 C 37 C  SpO2: 95% 98%    Last Pain:  Vitals:   10/04/23 1800  TempSrc: Oral  PainSc: 9                  Garnette FORBES Skillern

## 2023-10-04 NOTE — Progress Notes (Signed)
 Pharmacy Antibiotic Note  [name] is a 63 y.o. female admitted on 10/04/2023 with sepsis.  Pharmacy has been consulted for Vancomycin  dosing.  Plan: Vancomycin  1500 mg x 1 dose (already given periop) then 1250 mg IV every 24 hours.  Goal trough 15-20 mcg/mL. Estimated AUC 523.6.  Height: 5' 2.01 (157.5 cm) Weight: 133.4 kg (294 lb 1.5 oz) IBW/kg (Calculated) : 50.12  Temp (24hrs), Avg:98 F (36.7 C), Min:97.6 F (36.4 C), Max:98.6 F (37 C)  Recent Labs  Lab 10/04/23 0949 10/04/23 1124  WBC 24.5*  --   CREATININE 2.02*  --   LATICACIDVEN  --  3.7*    Estimated Creatinine Clearance: 38 mL/min (A) (by C-G formula based on SCr of 2.02 mg/dL (H)).    Allergies  Allergen Reactions   Morphine  Itching   Shrimp [Shellfish Allergy] Itching and Other (See Comments)    Tongue burns also   Tramadol  Other (See Comments)    Caused confusion   Covid-19 (Mrna) Vaccine Hives   Diltiazem  Hcl Itching    Pt with itching of the feet when bolus given   Latex Rash    Antimicrobials this admission: Vancomycin  8/24 >>  Cefepime  8/24 >>   Microbiology results: None resulted yet.  Thank you for allowing pharmacy to be a part of this patient's care.  Shylee Durrett [name] 10/04/2023 6:13 PM  [name], PharmD Clinical Pharmacist 10/04/2023  6:15 PM **Pharmacist phone directory can now be found on [email or web address] ([email or web address]).  Listed under [facility].

## 2023-10-04 NOTE — Progress Notes (Signed)
 Pharmacy Antibiotic Note  [name] is a 63 y.o. female admitted on 10/04/2023 with sepsis.  Pharmacy has been consulted for Cefepime  dosing.  Plan: Cefepime  2g Q12H  Height: 5' 2.01 (157.5 cm) Weight: 133.4 kg (294 lb 1.5 oz) IBW/kg (Calculated) : 50.12  Temp (24hrs), Avg:98 F (36.7 C), Min:97.6 F (36.4 C), Max:98.6 F (37 C)  Recent Labs  Lab 10/04/23 0949 10/04/23 1124  WBC 24.5*  --   CREATININE 2.02*  --   LATICACIDVEN  --  3.7*    Estimated Creatinine Clearance: 38 mL/min (A) (by C-G formula based on SCr of 2.02 mg/dL (H)).    Allergies  Allergen Reactions   Morphine  Itching   Shrimp [Shellfish Allergy] Itching and Other (See Comments)    Tongue burns also   Tramadol  Other (See Comments)    Caused confusion   Covid-19 (Mrna) Vaccine Hives   Diltiazem  Hcl Itching    Pt with itching of the feet when bolus given   Latex Rash    Antimicrobials this admission: Vancomycin  8/24 >>  Cefepime  8/24 >>   Microbiology results: None resulted yet.  Thank you for allowing pharmacy to be a part of this patient's care.  [name] [name] 10/04/2023 6:15 PM  [name], PharmD Clinical Pharmacist 10/04/2023  6:15 PM **Pharmacist phone directory can now be found on [email or web address] ([email or web address]).  Listed under [facility].

## 2023-10-04 NOTE — Plan of Care (Signed)

## 2023-10-04 NOTE — H&P (Signed)
 History and Physical    Patient: [name] FMW:[id number] DOB: 06-Oct-1960 DOA: 10/04/2023 DOS: the patient was seen and examined on 10/04/2023 . PCP: Norleen Lynwood ORN, MD  Patient coming from: Home Chief complaint: Chief Complaint  Patient presents with   Hip Pain   Constipation   Urinary Retention   HPI:  [name] is a 63 y.o. female with past medical history  of asthma, diverticulosis, GERD, HLD, HTN , admission for right hip pain and urinary retention from today am. Also + for dysuria , abdominal pain , fever / chills. CT imaging done shows 8 mm stone or two adjacent stones in the distal right ureter with mild right hydronephrosis . Pt admitted for ureteral stone / uti and sepsis.   ED Course:  Vital signs in the ED were notable for the following:  Vitals:   10/04/23 1715 10/04/23 1730 10/04/23 1745 10/04/23 1800  BP: 105/75 114/83 123/78 119/78  Pulse: (!) 103 97 97 95  Temp:   97.6 F (36.4 C) 98.6 F (37 C)  Resp: 13 (!) 21 (!) 21 18  Height:      Weight:      SpO2: 94% 97% 95% 98%  TempSrc:    Oral  BMI (Calculated):      >>ED evaluation thus far shows: CMP shows bicarb of 21 and glucose 162, BUN 28 and AKI with creatinine of 2.02 , albumin of 3.0 and AST of 125 and ALT of 90, lactic acid 3.7.CBC with wbc count of 24.5. EKG shows sinus tach at 103 PR 161 QTc of 469 QRS of 103 left atrial enlargement.  >>While in the ED patient received the following: Medications  lactated ringers  infusion (has no administration in time range)  chlorhexidine  (PERIDEX ) 0.12 % solution 15 mL (has no administration in time range)    Or  Oral care mouth rinse (has no administration in time range)  0.9 %  sodium chloride  infusion (has no administration in time range)  fentaNYL  (SUBLIMAZE ) injection 50 mcg (50 mcg Intravenous Given 10/04/23 1057)  ondansetron  (ZOFRAN ) injection 4 mg (4 mg Intravenous Given 10/04/23 1056)  ceFEPIme  (MAXIPIME ) 2 g in sodium chloride  0.9 % 100 mL  IVPB (0 g Intravenous Stopped 10/04/23 1133)  metroNIDAZOLE  (FLAGYL ) IVPB 500 mg (0 mg Intravenous Stopped 10/04/23 1434)  lactated ringers  bolus 1,000 mL (0 mLs Intravenous Stopped 10/04/23 1434)  lactated ringers  bolus 1,000 mL (0 mLs Intravenous Stopped 10/04/23 1434)  fentaNYL  (SUBLIMAZE ) injection 50 mcg (50 mcg Intravenous Given 10/04/23 1156)  ondansetron  (ZOFRAN ) injection 4 mg (4 mg Intravenous Given 10/04/23 1156)  HYDROmorphone  (DILAUDID ) injection 0.5 mg (0.5 mg Intravenous Given 10/04/23 1317)  diphenhydrAMINE  (BENADRYL ) injection 25 mg (25 mg Intravenous Given 10/04/23 1317)  HYDROmorphone  (DILAUDID ) injection 0.5 mg (0.5 mg Intravenous Given 10/04/23 1325)   Review of Systems  Genitourinary:  Positive for flank pain.  All other systems reviewed and are negative.  Past Medical History:  Diagnosis Date   Abscess of bladder 07/28/2016   Alcohol dependence (HCC)    Allergic rhinitis 12/06/2013   Anxiety    Asthma 03/16/2015   Atrial flutter with rapid ventricular response (HCC) 04/01/2020   COLONIC POLYPS, HX OF 04/05/2010   DIVERTICULITIS, HX OF 04/05/2010   DJD (degenerative joint disease)    right knee, mot to severe   Dysrhythmia    GERD (gastroesophageal reflux disease)    no meds   Heart murmur    hx of    History of  kidney stones    Hyperlipidemia    Hypertension    Impaired glucose tolerance 12/06/2013   Morbid obesity with BMI of 50.0-59.9, adult (HCC)    Nausea & vomiting 05/24/2022   Peripheral vascular disease (HCC)    Pulmonary embolism (HCC)    Past Surgical History:  Procedure Laterality Date   ABDOMINAL HYSTERECTOMY  age 22   fibroids   BREAST BIOPSY Left    COLONOSCOPY WITH PROPOFOL  N/A 07/25/2016   Procedure: COLONOSCOPY WITH PROPOFOL ;  Surgeon: Rollin Dover, MD;  Location: [facility];  Service: Endoscopy;  Laterality: N/A;   colonscopy     x 2   CYSTOSCOPY W/ URETERAL STENT PLACEMENT Right 09/05/2021   Procedure: CYSTOSCOPY WITH RETROGRADE  PYELOGRAM/URETERAL STENT PLACEMENT;  Surgeon: Selma Donnice SAUNDERS, MD;  Location: [facility];  Service: Urology;  Laterality: Right;   CYSTOSCOPY WITH RETROGRADE PYELOGRAM, URETEROSCOPY AND STENT PLACEMENT Left 08/14/2022   Procedure: CYSTOSCOPY WITH RETROGRADE PYELOGRAM;  Surgeon: Selma Donnice SAUNDERS, MD;  Location: [facility] OR;  Service: Urology;  Laterality: Left;   CYSTOSCOPY WITH URETEROSCOPY AND STENT PLACEMENT Left 08/14/2022   Procedure: URETEROSCOPY AND STENT PLACEMENT;  Surgeon: Selma Donnice SAUNDERS, MD;  Location: [facility] OR;  Service: Urology;  Laterality: Left;   CYSTOSCOPY/URETEROSCOPY/HOLMIUM LASER/STENT PLACEMENT Right 09/30/2021   Procedure: CYSTOSCOPY/ RETROGRADE/URETEROSCOPY/HOLMIUM LASER/STENT PLACEMENT;  Surgeon: Selma Donnice SAUNDERS, MD;  Location: [facility];  Service: Urology;  Laterality: Right;   CYSTOSCOPY/URETEROSCOPY/HOLMIUM LASER/STENT PLACEMENT Left 09/12/2022   Procedure: CYSTOSCOPY/LEFT RETROGRADE PYELOGRAM/LEFT URETEROSCOPY/HOLMIUM LASER/LEFT STENT EXCHANGE;  Surgeon: Selma Donnice SAUNDERS, MD;  Location: [facility];  Service: Urology;  Laterality: Left;  75 MINUTES NEEDED FOR CASE   IR RADIOLOGIST EVAL & MGMT  08/12/2016   IR RADIOLOGIST EVAL & MGMT  08/21/2016   KNEE ARTHROSCOPY     left    TOTAL KNEE ARTHROPLASTY  07/29/2011   Procedure: TOTAL KNEE ARTHROPLASTY;  Surgeon: Donnice JONETTA Car, MD;  Location: [facility];  Service: Orthopedics;  Laterality: Right;   TOTAL KNEE ARTHROPLASTY Left 12/14/2012   Procedure: LEFT TOTAL KNEE ARTHROPLASTY;  Surgeon: Donnice JONETTA Car, MD;  Location: [facility];  Service: Orthopedics;  Laterality: Left;    reports that she quit smoking about 40 years ago. Her smoking use included cigarettes. She started smoking about 47 years ago. She has a 3.5 pack-year smoking history. She has never used smokeless tobacco. She reports current alcohol use of about 1.0 standard drink of alcohol per week. She reports that she does not currently use drugs after having used the following drugs: Marijuana. Allergies   Allergen Reactions   Morphine  Itching   Shrimp [Shellfish Allergy] Itching and Other (See Comments)    Tongue burns also   Tramadol  Other (See Comments)    Caused confusion   Covid-19 (Mrna) Vaccine Hives   Diltiazem  Hcl Itching    Pt with itching of the feet when bolus given   Latex Rash   Family History  Problem Relation Age of Onset   Stroke Mother    COPD Father    Lymphoma Sister    Alcoholism Sister    Prior to Admission medications   Medication Sig Start Date End Date Taking? Authorizing Provider  metoprolol  succinate (TOPROL -XL) 25 MG 24 hr tablet Take 1 tablet (25 mg total) by mouth daily. 12/19/22  Yes Terra Fairy PARAS, PA-C  rivaroxaban  (XARELTO ) 20 MG TABS tablet Take 1 tablet (20 mg total) by mouth daily with supper. 12/19/22  Yes Terra Fairy PARAS, PA-C  acetaminophen  (TYLENOL ) 500 MG  tablet Take 1 tablet (500 mg total) by mouth every 6 (six) hours as needed for mild pain, fever or headache. Patient taking differently: Take 500 mg by mouth as needed for mild pain (pain score 1-3), fever or headache. 08/17/22   Danton Reyes DASEN, MD  albuterol  (VENTOLIN  HFA) 108 [phone] Base) MCG/ACT inhaler INHALE 2 PUFFS INTO THE LUNGS EVERY 6 HOURS AS NEEDED 08/06/23   Norleen Lynwood ORN, MD  atorvastatin  (LIPITOR) 20 MG tablet Take 1 tablet (20 mg total) by mouth daily. 08/06/23   Norleen Lynwood ORN, MD  calcium  carbonate (TUMS - DOSED IN MG ELEMENTAL CALCIUM ) 500 MG chewable tablet Chew 1 tablet by mouth daily as needed for indigestion or heartburn.    [provider]  famotidine  (PEPCID ) 20 MG tablet Take 1 tablet (20 mg total) by mouth 2 (two) times daily. 08/06/23   Norleen Lynwood ORN, MD  fluticasone  furoate-vilanterol (BREO ELLIPTA ) 200-25 MCG/ACT AEPB Inhale 1 puff into the lungs daily. 08/06/23   Norleen Lynwood ORN, MD  LORazepam  (ATIVAN ) 0.5 MG tablet Take 1 tablet (0.5 mg total) by mouth 2 (two) times daily as needed for anxiety. 08/06/23   Norleen Lynwood ORN, MD  losartan -hydrochlorothiazide  (HYZAAR)  100-25 MG tablet Take 1 tablet by mouth daily. 08/06/23   Norleen Lynwood ORN, MD  ondansetron  (ZOFRAN -ODT) 4 MG disintegrating tablet TAKE 1 TABLET BY MOUTH EVERY 8 HOURS AS NEEDED FOR NAUSEA AND VOMITING 07/24/23   Webb, Padonda B, FNP  potassium chloride  (KLOR-CON  10) 10 MEQ tablet Take 1 tablet (10 mEq total) by mouth daily. 08/06/23   Norleen Lynwood ORN, MD  Potassium Citrate 15 MEQ (1620 MG) TBCR Take 1 tablet by mouth as needed (dehydration). 08/21/22   [provider]  triamcinolone  cream (KENALOG ) 0.1 % APPLY 1 APPLICATION TOPICALLY 2 (TWO) TIMES DAILY AS NEEDED (ITCHING). 08/31/23   Norleen Lynwood ORN, MD                                                                                 Vitals:   10/04/23 1715 10/04/23 1730 10/04/23 1745 10/04/23 1800  BP: 105/75 114/83 123/78 119/78  Pulse: (!) 103 97 97 95  Resp: 13 (!) 21 (!) 21 18  Temp:   97.6 F (36.4 C) 98.6 F (37 C)  TempSrc:    Oral  SpO2: 94% 97% 95% 98%  Weight:      Height:       Physical Exam Vitals reviewed.  Constitutional:      General: She is not in acute distress.    Appearance: She is obese. She is not ill-appearing.  HENT:     Head: Normocephalic.  Eyes:     Extraocular Movements: Extraocular movements intact.  Cardiovascular:     Rate and Rhythm: Normal rate and regular rhythm.     Pulses: Normal pulses.     Heart sounds: Normal heart sounds.  Pulmonary:     Effort: Pulmonary effort is normal.     Breath sounds: Normal breath sounds.  Abdominal:     General: There is no distension.     Palpations: Abdomen is soft.     Tenderness: There is no abdominal tenderness.  Comments: Exam is limited due to morbid obesity.   Musculoskeletal:     Right lower leg: No edema.     Left lower leg: No edema.  Neurological:     General: No focal deficit present.     Mental Status: She is alert and oriented to person, place, and time.     Labs on Admission: I have personally reviewed following labs and imaging  studies CBC: Recent Labs  Lab 10/04/23 0949  WBC 24.5*  HGB 13.2  HCT 42.4  MCV 97.7  PLT 188   Basic Metabolic Panel: Recent Labs  Lab 10/04/23 0949  NA 139  K 3.9  CL 104  CO2 21*  GLUCOSE 162*  BUN 28*  CREATININE 2.02*  CALCIUM  8.9   GFR: Estimated Creatinine Clearance: 38 mL/min (A) (by C-G formula based on SCr of 2.02 mg/dL (H)). Liver Function Tests: Recent Labs  Lab 10/04/23 0949  AST 125*  ALT 90*  ALKPHOS 115  BILITOT 0.8  PROT 7.2  ALBUMIN 3.0*   No results for input(s): LIPASE, AMYLASE in the last 168 hours. No results for input(s): AMMONIA in the last 168 hours. Recent Labs    12/03/22 1536 04/17/23 1256 04/29/23 0948 05/31/23 1032 08/06/23 1517 08/17/23 0545 08/17/23 0915 10/04/23 0949  BUN 21 19 20 13  24* 14 14 28*  CREATININE 1.24* 1.09* 1.31* 1.00 1.17 1.05* 0.96 2.02*    Estimated Creatinine Clearance: 38 mL/min (A) (by C-G formula based on SCr of 2.02 mg/dL (H)).   Recent Labs    12/03/22 1536 04/17/23 1256 04/29/23 0948 05/31/23 1032 08/06/23 1517 08/17/23 0545 08/17/23 0915 10/04/23 0949  BUN 21 19 20 13  24* 14 14 28*  CREATININE 1.24* 1.09* 1.31* 1.00 1.17 1.05* 0.96 2.02*  CO2 32 24 25 21* 28 20* 23 21*   Cardiac Enzymes: No results for input(s): CKTOTAL, CKMB, CKMBINDEX, TROPONINI in the last 168 hours. BNP (last 3 results) No results for input(s): PROBNP in the last 8760 hours. HbA1C: No results for input(s): HGBA1C in the last 72 hours. CBG: Recent Labs  Lab 10/04/23 0953  GLUCAP 164*   Lipid Profile: No results for input(s): CHOL, HDL, LDLCALC, TRIG, CHOLHDL, LDLDIRECT in the last 72 hours. Thyroid  Function Tests: No results for input(s): TSH, T4TOTAL, FREET4, T3FREE, THYROIDAB in the last 72 hours. Anemia Panel: No results for input(s): VITAMINB12, FOLATE, FERRITIN, TIBC, IRON, RETICCTPCT in the last 72 hours. Urine analysis:    Component Value  Date/Time   COLORURINE YELLOW 10/04/2023 0949   APPEARANCEUR CLOUDY (A) 10/04/2023 0949   LABSPEC 1.020 10/04/2023 0949   PHURINE 5.5 10/04/2023 0949   GLUCOSEU NEGATIVE 10/04/2023 0949   GLUCOSEU NEGATIVE 12/03/2022 1536   HGBUR MODERATE (A) 10/04/2023 0949   BILIRUBINUR NEGATIVE 10/04/2023 0949   BILIRUBINUR negative 09/16/2011 1047   KETONESUR NEGATIVE 10/04/2023 0949   PROTEINUR TRACE (A) 10/04/2023 0949   UROBILINOGEN 0.2 12/03/2022 1536   NITRITE POSITIVE (A) 10/04/2023 0949   LEUKOCYTESUR MODERATE (A) 10/04/2023 0949   Radiological Exams on Admission: DG C-Arm 1-60 Min-No Report Result Date: 10/04/2023 Fluoroscopy was utilized by the requesting physician.  No radiographic interpretation.   CT Renal Stone Study Result Date: 10/04/2023 CLINICAL DATA:  Abdominal pain.  Concern for kidney stone. EXAM: CT ABDOMEN AND PELVIS WITHOUT CONTRAST TECHNIQUE: Multidetector CT imaging of the abdomen and pelvis was performed following the standard protocol without IV contrast. RADIATION DOSE REDUCTION: This exam was performed according to the departmental dose-optimization program which includes automated  exposure control, adjustment of the mA and/or kV according to patient size and/or use of iterative reconstruction technique. COMPARISON:  CT dated 08/14/2022. FINDINGS: Evaluation of this exam is limited in the absence of intravenous contrast. Lower chest: The visualized lung bases are clear. No intra-abdominal free air or free fluid. Hepatobiliary: The liver is mildly enlarged measuring 17 cm in midclavicular length. There is diffuse fatty infiltration of the liver. Several small lesions within the liver measure up to 3.5 cm in the dome of the liver are similar to prior CT and likely represent areas of fat sparing or hemangioma. No biliary dilatation. Tiny gallstones noted. No pericholecystic fluid or evidence of acute cholecystitis by CT. Pancreas: Unremarkable. No pancreatic ductal dilatation or  surrounding inflammatory changes. Spleen: Normal in size without focal abnormality. Adrenals/Urinary Tract: The adrenal glands are unremarkable. Nonobstructing bilateral renal calculi measure 1 cm in the inferior pole of the right kidney and 14 mm in the left kidney. No hydronephrosis on the left. There is an 8 mm stone or 2 adjacent stones in the distal right ureter. There is mild right hydronephrosis. The urinary bladder is collapsed. Stomach/Bowel: There is diffuse colonic diverticulosis. There is no bowel obstruction or active inflammation. The appendix is normal. Vascular/Lymphatic: Moderate aortoiliac atherosclerotic disease. The IVC is unremarkable. No portal venous gas. There is no adenopathy. Reproductive: Hysterectomy.  No suspicious adnexal masses. Other: Small fat containing umbilical hernia. Musculoskeletal: Degenerative changes of the spine. No acute osseous pathology. IMPRESSION: 1. An 8 mm stone or two adjacent stones in the distal right ureter with mild right hydronephrosis. 2. Additional nonobstructing bilateral renal calculi. 3. Colonic diverticulosis. No bowel obstruction. Normal appendix. 4. Fatty liver. 5. Cholelithiasis. 6.  Aortic Atherosclerosis (ICD10-I70.0). Electronically Signed   By: Vanetta Chou M.D.   On: 10/04/2023 13:32   Data Reviewed: Relevant notes from primary care and specialist visits, past discharge summaries as available in EHR, including Care Everywhere . Prior diagnostic testing as pertinent to current admission diagnoses, Updated medications and problem lists for reconciliation .ED course, including vitals, labs, imaging, treatment and response to treatment,Triage notes, nursing and pharmacy notes and ED provider's notes.Notable results as noted in HPI.Discussed case with EDMD/ ED APP/ or Specialty MD on call and as needed.  Assessment & Plan  >>Sepsis / UTI / ureteral obstruction with hydronephrosis: Will admit patient to cardiac telemetry unit with continuous  cardiac monitoring and pulse oximetry. Patient has received 1 L of LR bolus with continuous LR at 150 cc/h per sepsis protocol which we will complete. Monitor strict I's and O's, trend lactic acid follow culture sensitivity. Appreciate urology consult and management.  >>Abnormal LFT: Will get RUQ usg. And follow.  Suspect from fatty liver but she is also having pain in right flank which may be gb or liver associated but less likely.    >>Alcohol withdrawal history:  Patient states that she does not drink heavy only occasional.   >>Morbid obesity: Will obtain A1c, cardiac diet. Sleep apnea rule out will defer to PCP.   >>Atrial flutter : Currently in sinus rhythm.  Continue patient's metoprolol  from tomorrow.   >>CKD stage 3a: Lab Results  Component Value Date   CREATININE 2.02 (H) 10/04/2023   CREATININE 0.96 08/17/2023   CREATININE 1.05 (H) 08/17/2023  Stable avoid contrast studies.    >>H/O PE: Pt on xarelto  and we will resume tomorrow.   DVT prophylaxis:  Xarelto   Consults:  Urology   Advance Care Planning:    Code Status:  Prior   Family Communication:  None  Disposition Plan:  Home.  Severity of Illness: The appropriate patient status for this patient is INPATIENT. Inpatient status is judged to be reasonable and necessary in order to provide the required intensity of service to ensure the patient's safety. The patient's presenting symptoms, physical exam findings, and initial radiographic and laboratory data in the context of their chronic comorbidities is felt to place them at high risk for further clinical deterioration. Furthermore, it is not anticipated that the patient will be medically stable for discharge from the hospital within 2 midnights of admission.   * I certify that at the point of admission it is my clinical judgment that the patient will require inpatient hospital care spanning beyond 2 midnights from the point of admission due to high  intensity of service, high risk for further deterioration and high frequency of surveillance required.*  Unresulted Labs (From admission, onward)     Start     Ordered   10/04/23 1737  Hemoglobin A1c  Add-on,   AD        10/04/23 1736   10/04/23 1642  Aerobic/Anaerobic Culture w Gram Stain (surgical/deep wound)  RELEASE UPON ORDERING,   TIMED       Comments: Specimen A: Specimen Description Right renal pelvis Phone [phone] Immunocompromised?  No  Antibiotic Treatment:  none Is the patient on airborne/droplet precautions? No Clinical History:  ureteral stent Special Instructions:  none Specimen Disposition:  Microbiology     10/04/23 1642   10/04/23 1025  Blood culture (routine x 2)  BLOOD CULTURE X 2,   R      10/04/23 1025            Meds ordered this encounter  Medications   fentaNYL  (SUBLIMAZE ) injection 50 mcg   ondansetron  (ZOFRAN ) injection 4 mg   lactated ringers  infusion   ceFEPIme  (MAXIPIME ) 2 g in sodium chloride  0.9 % 100 mL IVPB    Antibiotic Indication::   Other Indication (list below)    Other Indication::   intra-abdominal infection   metroNIDAZOLE  (FLAGYL ) IVPB 500 mg    Antibiotic Indication::   Intra-abdominal Infection   lactated ringers  bolus 1,000 mL   lactated ringers  bolus 1,000 mL   fentaNYL  (SUBLIMAZE ) injection 50 mcg   ondansetron  (ZOFRAN ) injection 4 mg   HYDROmorphone  (DILAUDID ) injection 0.5 mg   diphenhydrAMINE  (BENADRYL ) injection 25 mg   HYDROmorphone  (DILAUDID ) injection 0.5 mg   OR Linked Order Group    chlorhexidine  (PERIDEX ) 0.12 % solution 15 mL    Oral care mouth rinse   DISCONTD: 0.9 %  sodium chloride  infusion   DISCONTD: fentaNYL  (SUBLIMAZE ) injection 25-50 mcg   DISCONTD: ondansetron  (ZOFRAN ) injection 4 mg   vancomycin  (VANCOREADY) IVPB 1500 mg/300 mL    Indication::   Surgical Prophylaxis   DISCONTD: iohexol  (OMNIPAQUE ) 300 MG/ML solution   DISCONTD: sodium chloride  irrigation 0.9 %   albuterol  (PROVENTIL ) (2.5  MG/3ML) 0.083% nebulizer solution 3 mL    INHALE 2 PUFFS INTO THE LUNGS EVERY 6 HOURS AS NEEDED     atorvastatin  (LIPITOR) tablet 20 mg   fluticasone  furoate-vilanterol (BREO ELLIPTA ) 200-25 MCG/ACT 1 puff   DISCONTD: metoprolol  succinate (TOPROL -XL) 24 hr tablet 25 mg   metoprolol  succinate (TOPROL -XL) 24 hr tablet 25 mg   ceFEPIme  (MAXIPIME ) 2 g in sodium chloride  0.9 % 100 mL IVPB    Antibiotic Indication::   Sepsis   vancomycin  (VANCOREADY) IVPB 1250 mg/250 mL    Indication::  Sepsis   rivaroxaban  (XARELTO ) tablet 20 mg     Orders Placed This Encounter  Procedures   Critical Care   Blood culture (routine x 2)   Aerobic/Anaerobic Culture w Gram Stain (surgical/deep wound)   CT Renal Stone Study   DG C-Arm 1-60 Min-No Report   US  Abdomen Limited RUQ (LIVER/GB)   Comprehensive metabolic panel   CBC   Urinalysis, Routine w reflex microscopic -Urine, Clean Catch   Urinalysis, Microscopic (reflex)   Hemoglobin A1c   Diet NPO time specified   Document Height and Actual Weight   DO NOT delay antibiotics if unable to obtain blood culture.   Vital signs   Pre-admission testing diagnosis   Anesthesia Preoperative Order   Continue foley catheter   Code Sepsis activation.  This occurs automatically when order is signed and prioritizes pharmacy, lab, and radiology services for STAT collections and interventions.  If [facility] downtime, call [facility] [phone]) to activate Code Sepsis.   Consult to urology  Urosepsis and stones   Consult to hospitalist  urosepsis, AKI   CeFEPIme  (MAXIPIME ) per pharmacy consult            vancomycin  per pharmacy consult   RIVAROXABAN  (XARELTO ) PER PHARMACY CONSULT   CBG monitoring, ED   I-Stat CG4 Lactic Acid   ED EKG   EKG 12-Lead   Insert 2nd peripheral IV if not already present.   Admit to Inpatient (patient's expected length of stay will be greater than 2 midnights or inpatient only procedure)    Author: Mario LULLA Blanch, MD 12 pm- 8 pm. [facility]. 10/04/2023 6:16 PM Please note for any communication after hours contact [facility] Assigned provider on call on Amion.

## 2023-10-04 NOTE — Progress Notes (Signed)
   10/04/23 1800  Vitals  Temp 98.6 F (37 C)  Temp Source Oral  BP 119/78  MAP (mmHg) 90  BP Location Right Arm  BP Method Automatic  Patient Position (if appropriate) Lying  Pulse Rate 95  Pulse Rate Source Monitor  ECG Heart Rate 95  Resp 18  Level of Consciousness  Level of Consciousness Alert  Oxygen Therapy  SpO2 98 %  O2 Device Nasal Cannula  O2 Flow Rate (L/min) 2 L/min  ECG Monitoring  Cardiac Rhythm NSR  Pain Assessment  Pain Scale 0-10  Pain Score 9  Pain Type Acute pain  Pain Location Back  Pain Frequency Constant  Pain Onset On-going  Pain Intervention(s) Repositioned;Medication (See eMAR)  MEWS Score  MEWS Temp 0  MEWS Systolic 0  MEWS Pulse 0  MEWS RR 0  MEWS LOC 0  MEWS Score 0  MEWS Score Color Green     VSS, pain assessed. Primary team informed.

## 2023-10-04 NOTE — Sepsis Progress Note (Signed)
 Repeat lactic acid delayed due to patient in OR.

## 2023-10-04 NOTE — Sepsis Progress Note (Signed)
 Sepsis protocol is being followed by [facility].

## 2023-10-04 NOTE — Progress Notes (Signed)
 PHARMACY - ANTICOAGULATION CONSULT NOTE  Pharmacy Consult for Resuming home Xarelto  on 8/25 Indication: pulmonary embolus  Allergies  Allergen Reactions   Morphine  Itching   Shrimp [Shellfish Allergy] Itching and Other (See Comments)    Tongue burns also   Tramadol  Other (See Comments)    Caused confusion   Covid-19 (Mrna) Vaccine Hives   Diltiazem  Hcl Itching    Pt with itching of the feet when bolus given   Latex Rash    Patient Measurements: Height: 5' 2.01 (157.5 cm) Weight: 133.4 kg (294 lb 1.5 oz) IBW/kg (Calculated) : 50.12 HEPARIN  DW (KG): 83.9  Vital Signs: Temp: 98.6 F (37 C) (08/24 1800) Temp Source: Oral (08/24 1800) BP: 119/78 (08/24 1800) Pulse Rate: 95 (08/24 1800)  Labs: Recent Labs    10/04/23 0949  HGB 13.2  HCT 42.4  PLT 188  CREATININE 2.02*    Estimated Creatinine Clearance: 38 mL/min (A) (by C-G formula based on SCr of 2.02 mg/dL (H)).   Medical History: Past Medical History:  Diagnosis Date   Abscess of bladder 07/28/2016   Alcohol dependence (HCC)    Allergic rhinitis 12/06/2013   Anxiety    Asthma 03/16/2015   Atrial flutter with rapid ventricular response (HCC) 04/01/2020   COLONIC POLYPS, HX OF 04/05/2010   DIVERTICULITIS, HX OF 04/05/2010   DJD (degenerative joint disease)    right knee, mot to severe   Dysrhythmia    GERD (gastroesophageal reflux disease)    no meds   Heart murmur    hx of    History of kidney stones    Hyperlipidemia    Hypertension    Impaired glucose tolerance 12/06/2013   Morbid obesity with BMI of 50.0-59.9, adult (HCC)    Nausea & vomiting 05/24/2022   Peripheral vascular disease (HCC)    Pulmonary embolism (HCC)     Medications:  Medications Prior to Admission  Medication Sig Dispense Refill Last Dose/Taking   metoprolol  succinate (TOPROL -XL) 25 MG 24 hr tablet Take 1 tablet (25 mg total) by mouth daily. 30 tablet 11 10/03/2023   rivaroxaban  (XARELTO ) 20 MG TABS tablet Take 1 tablet (20  mg total) by mouth daily with supper. 30 tablet 6 10/03/2023   acetaminophen  (TYLENOL ) 500 MG tablet Take 1 tablet (500 mg total) by mouth every 6 (six) hours as needed for mild pain, fever or headache. (Patient taking differently: Take 500 mg by mouth as needed for mild pain (pain score 1-3), fever or headache.) 30 tablet 0    albuterol  (VENTOLIN  HFA) 108 (90 Base) MCG/ACT inhaler INHALE 2 PUFFS INTO THE LUNGS EVERY 6 HOURS AS NEEDED 18 each 5    atorvastatin  (LIPITOR) 20 MG tablet Take 1 tablet (20 mg total) by mouth daily. 90 tablet 3    calcium  carbonate (TUMS - DOSED IN MG ELEMENTAL CALCIUM ) 500 MG chewable tablet Chew 1 tablet by mouth daily as needed for indigestion or heartburn.      famotidine  (PEPCID ) 20 MG tablet Take 1 tablet (20 mg total) by mouth 2 (two) times daily. 180 tablet 3    fluticasone  furoate-vilanterol (BREO ELLIPTA ) 200-25 MCG/ACT AEPB Inhale 1 puff into the lungs daily. 3 each 3    LORazepam  (ATIVAN ) 0.5 MG tablet Take 1 tablet (0.5 mg total) by mouth 2 (two) times daily as needed for anxiety. 30 tablet 2    losartan -hydrochlorothiazide  (HYZAAR) 100-25 MG tablet Take 1 tablet by mouth daily. 30 tablet 8    ondansetron  (ZOFRAN -ODT) 4 MG disintegrating tablet  TAKE 1 TABLET BY MOUTH EVERY 8 HOURS AS NEEDED FOR NAUSEA AND VOMITING 18 tablet 3    potassium chloride  (KLOR-CON  10) 10 MEQ tablet Take 1 tablet (10 mEq total) by mouth daily. 90 tablet 3    Potassium Citrate 15 MEQ (1620 MG) TBCR Take 1 tablet by mouth as needed (dehydration).      triamcinolone  cream (KENALOG ) 0.1 % APPLY 1 APPLICATION TOPICALLY 2 (TWO) TIMES DAILY AS NEEDED (ITCHING). 30 g 2     Assessment and Plan: Total body weight CrCl: 61 mL/min Continue home dose rivaroxaban  20 mg with supper on 8/25  Tabitha Riggins M Keeven Matty 10/04/2023,6:11 PM

## 2023-10-04 NOTE — Anesthesia Preprocedure Evaluation (Addendum)
 Anesthesia Evaluation  Patient identified by MRN, date of birth, ID band  Reviewed: Allergy & Precautions, NPO status , Patient's Chart, lab work & pertinent test results  Airway Mallampati: II  TM Distance: >3 FB Neck ROM: Full    Dental  (+) Teeth Intact, Dental Advisory Given   Pulmonary asthma , former smoker   Pulmonary exam normal breath sounds clear to auscultation       Cardiovascular hypertension, Pt. on medications + Peripheral Vascular Disease  Normal cardiovascular exam+ dysrhythmias Atrial Fibrillation  Rhythm:Regular Rate:Normal     Neuro/Psych  PSYCHIATRIC DISORDERS Anxiety Depression    negative neurological ROS     GI/Hepatic Neg liver ROS,GERD  ,,  Endo/Other    Class 4 obesity  Renal/GU Renal InsufficiencyRenal disease (AKI)     Musculoskeletal  (+) Arthritis ,    Abdominal   Peds  Hematology negative hematology ROS (+)   Anesthesia Other Findings Day of surgery medications reviewed with the patient.  Reproductive/Obstetrics                              Anesthesia Physical Anesthesia Plan  ASA: 4 and emergent  Anesthesia Plan: General   Post-op Pain Management: Ofirmev  IV (intra-op)*   Induction: Intravenous  PONV Risk Score and Plan: 4 or greater and Ondansetron   Airway Management Planned: Oral ETT  Additional Equipment:   Intra-op Plan:   Post-operative Plan: Extubation in OR  Informed Consent:   Plan Discussed with: CRNA  Anesthesia Plan Comments:          Anesthesia Quick Evaluation

## 2023-10-04 NOTE — Consult Note (Signed)
 I have been asked to see the patient by Dr. Hoy Fraction, for evaluation and management of obstructive pyelonephritis.SABRA  History of present illness: 63 year old female with a previous history of obstructive pyelonephritis presented the ER today after having nausea vomiting as well as groin pain this morning.  She presented to the ER where CT showed a right distal ureteral stone.  Her lactate was 3.7 UA was concerning for infection her white count was 24.5 and creatinine was elevated from baseline to 2.0 from 0.9.  She denies any gross hematuria and stated that all symptoms started this morning.  Patient is [facility] at [facility] urology by Dr. Selma.  She has history of right ureteral stone and had ureteroscopy.  She has a history of bladder wall and diverticular abscess.  This was managed with an IR drain in 2018 cystoscopy in 2018 showed no fistula.   Review of systems: A 12 point comprehensive review of systems was obtained and is negative unless otherwise stated in the history of present illness.  Patient Active Problem List   Diagnosis Date Noted   Hypercoagulable state due to paroxysmal atrial fibrillation (HCC) 12/19/2022   Acute unilateral obstructive uropathy 08/14/2022   Chronic kidney disease, stage 3a (HCC) 08/14/2022   Alcohol withdrawal with perceptual disturbances (HCC) 05/24/2022   Nausea & vomiting 05/24/2022   Abdominal pain 05/24/2022   History of asthma 05/24/2022   Elevated AST (SGOT) 05/24/2022   Prediabetes 05/24/2022   Asthma, moderate persistent, poorly-controlled 11/13/2021   Cough 11/06/2021   Wheezing 11/06/2021   Chronic anticoagulation 11/06/2021   E coli bacteremia 09/06/2021   AKI (acute kidney injury) (HCC) 09/06/2021   Right ureteral stone 09/06/2021   Hydronephrosis of right kidney 09/06/2021   Acute pyelonephritis 09/06/2021   Alcohol dependence with withdrawal delirium (HCC) 09/02/2021   Vitamin D  deficiency 03/13/2021   Microhematuria 03/13/2021    Tear of distal tendon of biceps 11/16/2020   Positive blood cultures    Asthma exacerbation 07/22/2020   Hyperkalemia 07/22/2020   Paroxysmal atrial fibrillation (HCC) 04/01/2020   Atrial flutter with rapid ventricular response (HCC) 04/01/2020   Alcohol dependence with withdrawal (HCC) 12/19/2019   History of pulmonary embolism 12/19/2019   Pneumonia due to COVID-19 virus 02/10/2019   Pulmonary embolism (HCC) 07/06/2018   Leukocytosis 09/22/2017   Rash 09/22/2017   Sepsis (HCC) 09/12/2016   Colovesical fistula 09/12/2016   Alcohol abuse 07/28/2016   Colonic diverticulum 07/28/2016   Hyperlipidemia 07/10/2016   Mass of left side of neck 07/10/2016   Head and neck lymphadenopathy 07/10/2016   Eustachian tube disorder 01/25/2016   Peripheral edema 03/16/2015   Asthma 03/16/2015   Right shoulder pain 03/16/2015   Hypokalemia 10/04/2014   Upper airway cough syndrome 10/03/2014   Morbid obesity with BMI of 50.0-59.9, adult (HCC) 10/03/2014   Lower back pain 05/25/2014   Bilateral shoulder pain 05/25/2014   Allergic rhinitis 12/06/2013   Impaired glucose tolerance 12/06/2013   Expected blood loss anemia 12/16/2012   Morbid obesity (HCC) 12/15/2012   S/P left TKA 12/14/2012   Goiter 11/26/2012   Preop exam for internal medicine 11/26/2012   Vaginal bleeding 04/30/2011   Colon polyps 02/10/2011   Eczema 02/10/2011   Depression 02/10/2011   Encounter for well adult exam with abnormal findings 09/27/2010   Anxiety state 04/05/2010   DIVERTICULITIS, HX OF 04/05/2010   PALPITATIONS, HX OF 09/14/2007   Essential hypertension 04/24/2007   VOCAL CORD DISORDER 04/24/2007   Extrinsic asthma 04/24/2007   GERD  04/24/2007    No current facility-administered medications on file prior to encounter.   Current Outpatient Medications on File Prior to Encounter  Medication Sig Dispense Refill   metoprolol  succinate (TOPROL -XL) 25 MG 24 hr tablet Take 1 tablet (25 mg total) by mouth  daily. 30 tablet 11   rivaroxaban  (XARELTO ) 20 MG TABS tablet Take 1 tablet (20 mg total) by mouth daily with supper. 30 tablet 6   acetaminophen  (TYLENOL ) 500 MG tablet Take 1 tablet (500 mg total) by mouth every 6 (six) hours as needed for mild pain, fever or headache. (Patient taking differently: Take 500 mg by mouth as needed for mild pain (pain score 1-3), fever or headache.) 30 tablet 0   albuterol  (VENTOLIN  HFA) 108 (90 Base) MCG/ACT inhaler INHALE 2 PUFFS INTO THE LUNGS EVERY 6 HOURS AS NEEDED 18 each 5   atorvastatin  (LIPITOR) 20 MG tablet Take 1 tablet (20 mg total) by mouth daily. 90 tablet 3   calcium  carbonate (TUMS - DOSED IN MG ELEMENTAL CALCIUM ) 500 MG chewable tablet Chew 1 tablet by mouth daily as needed for indigestion or heartburn.     famotidine  (PEPCID ) 20 MG tablet Take 1 tablet (20 mg total) by mouth 2 (two) times daily. 180 tablet 3   fluticasone  furoate-vilanterol (BREO ELLIPTA ) 200-25 MCG/ACT AEPB Inhale 1 puff into the lungs daily. 3 each 3   LORazepam  (ATIVAN ) 0.5 MG tablet Take 1 tablet (0.5 mg total) by mouth 2 (two) times daily as needed for anxiety. 30 tablet 2   losartan -hydrochlorothiazide  (HYZAAR) 100-25 MG tablet Take 1 tablet by mouth daily. 30 tablet 8   ondansetron  (ZOFRAN -ODT) 4 MG disintegrating tablet TAKE 1 TABLET BY MOUTH EVERY 8 HOURS AS NEEDED FOR NAUSEA AND VOMITING 18 tablet 3   potassium chloride  (KLOR-CON  10) 10 MEQ tablet Take 1 tablet (10 mEq total) by mouth daily. 90 tablet 3   Potassium Citrate 15 MEQ (1620 MG) TBCR Take 1 tablet by mouth as needed (dehydration).     triamcinolone  cream (KENALOG ) 0.1 % APPLY 1 APPLICATION TOPICALLY 2 (TWO) TIMES DAILY AS NEEDED (ITCHING). 30 g 2    Past Medical History:  Diagnosis Date   Abscess of bladder 07/28/2016   Alcohol dependence (HCC)    Allergic rhinitis 12/06/2013   Anxiety    Asthma 03/16/2015   Atrial flutter with rapid ventricular response (HCC) 04/01/2020   COLONIC POLYPS, HX OF 04/05/2010    DIVERTICULITIS, HX OF 04/05/2010   DJD (degenerative joint disease)    right knee, mot to severe   Dysrhythmia    GERD (gastroesophageal reflux disease)    no meds   Heart murmur    hx of    History of kidney stones    Hyperlipidemia    Hypertension    Impaired glucose tolerance 12/06/2013   Morbid obesity with BMI of 50.0-59.9, adult (HCC)    Nausea & vomiting 05/24/2022   Peripheral vascular disease (HCC)    Pulmonary embolism (HCC)     Past Surgical History:  Procedure Laterality Date   ABDOMINAL HYSTERECTOMY  age 2   fibroids   BREAST BIOPSY Left    COLONOSCOPY WITH PROPOFOL  N/A 07/25/2016   Procedure: COLONOSCOPY WITH PROPOFOL ;  Surgeon: Rollin Dover, MD;  Location: [facility];  Service: Endoscopy;  Laterality: N/A;   colonscopy     x 2   CYSTOSCOPY W/ URETERAL STENT PLACEMENT Right 09/05/2021   Procedure: CYSTOSCOPY WITH RETROGRADE PYELOGRAM/URETERAL STENT PLACEMENT;  Surgeon: Selma Donnice SAUNDERS, MD;  Location:  [facility];  Service: Urology;  Laterality: Right;   CYSTOSCOPY WITH RETROGRADE PYELOGRAM, URETEROSCOPY AND STENT PLACEMENT Left 08/14/2022   Procedure: CYSTOSCOPY WITH RETROGRADE PYELOGRAM;  Surgeon: Selma Donnice SAUNDERS, MD;  Location: [facility] OR;  Service: Urology;  Laterality: Left;   CYSTOSCOPY WITH URETEROSCOPY AND STENT PLACEMENT Left 08/14/2022   Procedure: URETEROSCOPY AND STENT PLACEMENT;  Surgeon: Selma Donnice SAUNDERS, MD;  Location: [facility] OR;  Service: Urology;  Laterality: Left;   CYSTOSCOPY/URETEROSCOPY/HOLMIUM LASER/STENT PLACEMENT Right 09/30/2021   Procedure: CYSTOSCOPY/ RETROGRADE/URETEROSCOPY/HOLMIUM LASER/STENT PLACEMENT;  Surgeon: Selma Donnice SAUNDERS, MD;  Location: [facility];  Service: Urology;  Laterality: Right;   CYSTOSCOPY/URETEROSCOPY/HOLMIUM LASER/STENT PLACEMENT Left 09/12/2022   Procedure: CYSTOSCOPY/LEFT RETROGRADE PYELOGRAM/LEFT URETEROSCOPY/HOLMIUM LASER/LEFT STENT EXCHANGE;  Surgeon: Selma Donnice SAUNDERS, MD;  Location: [facility];  Service: Urology;  Laterality: Left;  76  MINUTES NEEDED FOR CASE   IR RADIOLOGIST EVAL & MGMT  08/12/2016   IR RADIOLOGIST EVAL & MGMT  08/21/2016   KNEE ARTHROSCOPY     left    TOTAL KNEE ARTHROPLASTY  07/29/2011   Procedure: TOTAL KNEE ARTHROPLASTY;  Surgeon: Donnice JONETTA Car, MD;  Location: [facility];  Service: Orthopedics;  Laterality: Right;   TOTAL KNEE ARTHROPLASTY Left 12/14/2012   Procedure: LEFT TOTAL KNEE ARTHROPLASTY;  Surgeon: Donnice JONETTA Car, MD;  Location: [facility];  Service: Orthopedics;  Laterality: Left;    Social History   Tobacco Use   Smoking status: Former    Current packs/day: 0.00    Average packs/day: 0.5 packs/day for 7.0 years (3.5 ttl pk-yrs)    Types: Cigarettes    Start date: 02/11/1976    Quit date: 02/11/1983    Years since quitting: 40.6   Smokeless tobacco: Never  Vaping Use   Vaping status: Never Used  Substance Use Topics   Alcohol use: Yes    Alcohol/week: 1.0 standard drink of alcohol    Types: 1 Shots of liquor per week    Comment: daily half pint   Drug use: Not Currently    Types: Marijuana    Comment: smoked marijuana x 10 years.  Quit in 1985.    Family History  Problem Relation Age of Onset   Stroke Mother    COPD Father    Lymphoma Sister    Alcoholism Sister     PE: Vitals:   10/04/23 1415 10/04/23 1430 10/04/23 1502 10/04/23 1528  BP: 125/64 122/69    Pulse: 95 94    Resp:      Temp:    98 F (36.7 C)  TempSrc:    Oral  SpO2: 95% 95%    Weight:   133.4 kg   Height:   5' 2.01 (1.575 m)    General: No acute distress Respiratory: Normal breathing room air Cardiovascular: Regular rate and rhythm per monitor GU: Some right-sided flank pain.  Recent Labs    10/04/23 0949  WBC 24.5*  HGB 13.2  HCT 42.4   Recent Labs    10/04/23 0949  NA 139  K 3.9  CL 104  CO2 21*  GLUCOSE 162*  BUN 28*  CREATININE 2.02*  CALCIUM  8.9   No results for input(s): LABPT, INR in the last 72 hours. No results for input(s): LABURIN in the last 72 hours. Results for  orders placed or performed during the hospital encounter of 04/17/23  Urine Culture     Status: Abnormal   Collection Time: 04/17/23 12:41 PM   Specimen: Urine, Clean Catch  Result Value Ref Range  Status   Specimen Description URINE, CLEAN CATCH  Final   Special Requests   Final    NONE Performed at [facility], [address], [place], [place] [address]    Culture (A)  Final    >=100,000 COLONIES/mL ESCHERICHIA COLI 80,000 COLONIES/mL KLEBSIELLA PNEUMONIAE    Report Status 04/21/2023 FINAL  Final   Organism ID, Bacteria ESCHERICHIA COLI (A)  Final   Organism ID, Bacteria KLEBSIELLA PNEUMONIAE (A)  Final      Susceptibility   Escherichia coli - MIC*    AMPICILLIN 4 SENSITIVE Sensitive     CEFAZOLIN  <=4 SENSITIVE Sensitive     CEFEPIME  <=0.12 SENSITIVE Sensitive     CEFTRIAXONE  <=0.25 SENSITIVE Sensitive     CIPROFLOXACIN  >=4 RESISTANT Resistant     GENTAMICIN <=1 SENSITIVE Sensitive     IMIPENEM <=0.25 SENSITIVE Sensitive     NITROFURANTOIN <=16 SENSITIVE Sensitive     TRIMETH/SULFA <=20 SENSITIVE Sensitive     AMPICILLIN/SULBACTAM <=2 SENSITIVE Sensitive     PIP/TAZO <=4 SENSITIVE Sensitive ug/mL    * >=100,000 COLONIES/mL ESCHERICHIA COLI   Klebsiella pneumoniae - MIC*    AMPICILLIN RESISTANT Resistant     CEFAZOLIN  <=4 SENSITIVE Sensitive     CEFEPIME  <=0.12 SENSITIVE Sensitive     CEFTRIAXONE  <=0.25 SENSITIVE Sensitive     CIPROFLOXACIN  <=0.25 SENSITIVE Sensitive     GENTAMICIN <=1 SENSITIVE Sensitive     IMIPENEM <=0.25 SENSITIVE Sensitive     NITROFURANTOIN <=16 SENSITIVE Sensitive     TRIMETH/SULFA <=20 SENSITIVE Sensitive     AMPICILLIN/SULBACTAM <=2 SENSITIVE Sensitive     PIP/TAZO <=4 SENSITIVE Sensitive ug/mL    * 80,000 COLONIES/mL KLEBSIELLA PNEUMONIAE    Imaging: CT 8/24 IMPRESSION: 1. An 8 mm stone or two adjacent stones in the distal right ureter with mild right hydronephrosis. 2. Additional nonobstructing bilateral renal calculi. 3. Colonic  diverticulosis. No bowel obstruction. Normal appendix. 4. Fatty liver. 5. Cholelithiasis. 6.  Aortic Atherosclerosis (ICD10-I70.0).  Imp: 63 year old female with right obstructive pyelonephritis.  Will plan for right ureteral stent placement.  Patient has already received cefepime  we will give vancomycin  as well.  We discussed risk benefits alternatives to stent.  This included bleeding infection and damage to surrounding structures surrounding structures including ureter as well as urethra.  We discussed the need for stent postoperatively as well as the potential symptoms of stent placement.  We discussed possible inability to complete procedure due to caliber of your ureter or inability to pass stone possibly requiring long-term stent versus nephrostomy tube.  We discussed need for possible second surgery.  Patient voiced their understanding and consent was obtained.   Recommendations: - Admit to medicine - Keep n.p.o. - Plan for cystoscopy right retrograde pyelogram and stent placement.   Thank you for involving me in this patient's care, I will continue to follow along.Please page with any further questions or concerns. Steffan JAYSON Pea

## 2023-10-04 NOTE — ED Triage Notes (Signed)
 Pt c.o right side hip pain x 3 weeks Pt c.o urinary retention that started this morning.  Pt also c.o constipation for the past 4 days. Reports small BM this morning.

## 2023-10-04 NOTE — Transfer of Care (Signed)
 Immediate Anesthesia Transfer of Care Note  Patient: [name]  Procedure(s) Performed: CYSTOSCOPY, WITH RETROGRADE PYELOGRAM AND URETERAL STENT INSERTION (Right)  Patient Location: PACU  Anesthesia Type:General  Level of Consciousness: awake and alert   Airway & Oxygen Therapy: Patient Spontanous Breathing and Patient connected to nasal cannula oxygen  Post-op Assessment: Report given to RN and Post -op Vital signs reviewed and stable  Post vital signs: Reviewed and stable  Last Vitals:  Vitals Value Taken Time  BP 111/71 10/04/23 17:03  Temp    Pulse 104 10/04/23 17:05  Resp 21 10/04/23 17:05  SpO2 91 % 10/04/23 17:05  Vitals shown include unfiled device data.  Last Pain:  Vitals:   10/04/23 1528  TempSrc: Oral  PainSc:          Complications: No notable events documented.

## 2023-10-04 NOTE — ED Provider Notes (Signed)
 [place] [facility] Provider Note   CSN: [id number] Arrival date & time: 10/04/23  9056     Patient presents with: Hip Pain, Constipation, and Urinary Retention   [name] is a 63 y.o. female with PMHx asthma, diverticulosis, GERD, HLD, HTN, who presents to ED concerned for right hip pain x3 weeks which has recently progressed to lower abdominal pain. Patient also stating that she was having difficulty urinating this morning, but was able to provide a urine sample. Patient also endorsing constipation x4 days but was able to have a small BM this morning. Patient also endorsing chills and fever of 100.64F at home this morning. Patient also with one episode of vomiting this morning. Patient took a dose of nausea medication at home this morning - no other medications.   Denies chest pain, dyspnea, coughing. Denies hematemesis or hematochezia. Denies dysuria or hematuria.     Hip Pain  Constipation      Prior to Admission medications   Medication Sig Start Date End Date Taking? Authorizing Provider  acetaminophen  (TYLENOL ) 500 MG tablet Take 1 tablet (500 mg total) by mouth every 6 (six) hours as needed for mild pain, fever or headache. Patient taking differently: Take 500 mg by mouth as needed for mild pain (pain score 1-3), fever or headache. 08/17/22   Danton Reyes DASEN, MD  albuterol  (VENTOLIN  HFA) 108 [phone] Base) MCG/ACT inhaler INHALE 2 PUFFS INTO THE LUNGS EVERY 6 HOURS AS NEEDED 08/06/23   Norleen Lynwood ORN, MD  atorvastatin  (LIPITOR) 20 MG tablet Take 1 tablet (20 mg total) by mouth daily. 08/06/23   Norleen Lynwood ORN, MD  calcium  carbonate (TUMS - DOSED IN MG ELEMENTAL CALCIUM ) 500 MG chewable tablet Chew 1 tablet by mouth daily as needed for indigestion or heartburn.    [provider]  famotidine  (PEPCID ) 20 MG tablet Take 1 tablet (20 mg total) by mouth 2 (two) times daily. 08/06/23   Norleen Lynwood ORN, MD  fluticasone  furoate-vilanterol (BREO  ELLIPTA) 200-25 MCG/ACT AEPB Inhale 1 puff into the lungs daily. 08/06/23   Norleen Lynwood ORN, MD  LORazepam  (ATIVAN ) 0.5 MG tablet Take 1 tablet (0.5 mg total) by mouth 2 (two) times daily as needed for anxiety. 08/06/23   Norleen Lynwood ORN, MD  losartan -hydrochlorothiazide  (HYZAAR) 100-25 MG tablet Take 1 tablet by mouth daily. 08/06/23   Norleen Lynwood ORN, MD  metoprolol  succinate (TOPROL -XL) 25 MG 24 hr tablet Take 1 tablet (25 mg total) by mouth daily. 12/19/22   Terra Fairy PARAS, PA-C  ondansetron  (ZOFRAN -ODT) 4 MG disintegrating tablet TAKE 1 TABLET BY MOUTH EVERY 8 HOURS AS NEEDED FOR NAUSEA AND VOMITING 07/24/23   Webb, Padonda B, FNP  potassium chloride  (KLOR-CON  10) 10 MEQ tablet Take 1 tablet (10 mEq total) by mouth daily. 08/06/23   Norleen Lynwood ORN, MD  Potassium Citrate 15 MEQ (1620 MG) TBCR Take 1 tablet by mouth as needed (dehydration). 08/21/22   [provider]  rivaroxaban  (XARELTO ) 20 MG TABS tablet Take 1 tablet (20 mg total) by mouth daily with supper. 12/19/22   Terra Fairy PARAS, PA-C  triamcinolone  cream (KENALOG ) 0.1 % APPLY 1 APPLICATION TOPICALLY 2 (TWO) TIMES DAILY AS NEEDED (ITCHING). 08/31/23   Norleen Lynwood ORN, MD    Allergies: Morphine , Shrimp [shellfish allergy], Tramadol , Covid-19 (mrna) vaccine, Diltiazem  hcl, and Latex    Review of Systems  Gastrointestinal:  Positive for constipation.    Updated Vital Signs BP 122/69   Pulse  94   Temp 97.9 F (36.6 C) (Oral)   Resp 18   Ht (P) 5' 2.01 (1.575 m)   SpO2 95%   BMI (P) 53.77 kg/m   Physical Exam Vitals and nursing note reviewed.  Constitutional:      General: She is not in acute distress.    Appearance: She is not ill-appearing or toxic-appearing.  HENT:     Head: Normocephalic and atraumatic.     Mouth/Throat:     Mouth: Mucous membranes are moist.  Eyes:     General: No scleral icterus.       Right eye: No discharge.        Left eye: No discharge.     Conjunctiva/sclera: Conjunctivae normal.   Cardiovascular:     Rate and Rhythm: Normal rate and regular rhythm.     Pulses: Normal pulses.     Heart sounds: Normal heart sounds. No murmur heard. Pulmonary:     Effort: Pulmonary effort is normal. No respiratory distress.     Breath sounds: Normal breath sounds. No wheezing, rhonchi or rales.  Abdominal:     General: Abdomen is flat. Bowel sounds are normal. There is no distension.     Palpations: Abdomen is soft. There is no mass.     Tenderness: There is no abdominal tenderness.  Musculoskeletal:     Right lower leg: No edema.     Left lower leg: No edema.  Skin:    General: Skin is warm and dry.     Findings: No rash.  Neurological:     General: No focal deficit present.     Mental Status: She is alert and oriented to person, place, and time. Mental status is at baseline.  Psychiatric:        Mood and Affect: Mood normal.        Behavior: Behavior normal.     (all labs ordered are listed, but only abnormal results are displayed) Labs Reviewed  COMPREHENSIVE METABOLIC PANEL WITH GFR - Abnormal; Notable for the following components:      Result Value   CO2 21 (*)    Glucose, Bld 162 (*)    BUN 28 (*)    Creatinine, Ser 2.02 (*)    Albumin 3.0 (*)    AST 125 (*)    ALT 90 (*)    GFR, Estimated 27 (*)    All other components within normal limits  CBC - Abnormal; Notable for the following components:   WBC 24.5 (*)    All other components within normal limits  URINALYSIS, ROUTINE W REFLEX MICROSCOPIC - Abnormal; Notable for the following components:   APPearance CLOUDY (*)    Hgb urine dipstick MODERATE (*)    Protein, ur TRACE (*)    Nitrite POSITIVE (*)    Leukocytes,Ua MODERATE (*)    All other components within normal limits  URINALYSIS, MICROSCOPIC (REFLEX) - Abnormal; Notable for the following components:   Bacteria, UA FEW (*)    All other components within normal limits  CBG MONITORING, ED - Abnormal; Notable for the following components:    Glucose-Capillary 164 (*)    All other components within normal limits  I-STAT CG4 LACTIC ACID, ED - Abnormal; Notable for the following components:   Lactic Acid, Venous 3.7 (*)    All other components within normal limits  CULTURE, BLOOD (ROUTINE X 2)  CULTURE, BLOOD (ROUTINE X 2)  I-STAT CG4 LACTIC ACID, ED    EKG: EKG Interpretation Date/Time:  Sunday October 04 2023 10:44:01 EDT Ventricular Rate:  103 PR Interval:  161 QRS Duration:  103 QT Interval:  358 QTC Calculation: 469 R Axis:   1  Text Interpretation: Sinus tachycardia Borderline ST depression, lateral leads Confirmed by Neysa Clap [phone]) on 10/04/2023 10:47:50 AM  Radiology: CT Renal Stone Study Result Date: 10/04/2023 CLINICAL DATA:  Abdominal pain.  Concern for kidney stone. EXAM: CT ABDOMEN AND PELVIS WITHOUT CONTRAST TECHNIQUE: Multidetector CT imaging of the abdomen and pelvis was performed following the standard protocol without IV contrast. RADIATION DOSE REDUCTION: This exam was performed according to the departmental dose-optimization program which includes automated exposure control, adjustment of the mA and/or kV according to patient size and/or use of iterative reconstruction technique. COMPARISON:  CT dated 08/14/2022. FINDINGS: Evaluation of this exam is limited in the absence of intravenous contrast. Lower chest: The visualized lung bases are clear. No intra-abdominal free air or free fluid. Hepatobiliary: The liver is mildly enlarged measuring 17 cm in midclavicular length. There is diffuse fatty infiltration of the liver. Several small lesions within the liver measure up to 3.5 cm in the dome of the liver are similar to prior CT and likely represent areas of fat sparing or hemangioma. No biliary dilatation. Tiny gallstones noted. No pericholecystic fluid or evidence of acute cholecystitis by CT. Pancreas: Unremarkable. No pancreatic ductal dilatation or surrounding inflammatory changes. Spleen: Normal in size  without focal abnormality. Adrenals/Urinary Tract: The adrenal glands are unremarkable. Nonobstructing bilateral renal calculi measure 1 cm in the inferior pole of the right kidney and 14 mm in the left kidney. No hydronephrosis on the left. There is an 8 mm stone or 2 adjacent stones in the distal right ureter. There is mild right hydronephrosis. The urinary bladder is collapsed. Stomach/Bowel: There is diffuse colonic diverticulosis. There is no bowel obstruction or active inflammation. The appendix is normal. Vascular/Lymphatic: Moderate aortoiliac atherosclerotic disease. The IVC is unremarkable. No portal venous gas. There is no adenopathy. Reproductive: Hysterectomy.  No suspicious adnexal masses. Other: Small fat containing umbilical hernia. Musculoskeletal: Degenerative changes of the spine. No acute osseous pathology. IMPRESSION: 1. An 8 mm stone or two adjacent stones in the distal right ureter with mild right hydronephrosis. 2. Additional nonobstructing bilateral renal calculi. 3. Colonic diverticulosis. No bowel obstruction. Normal appendix. 4. Fatty liver. 5. Cholelithiasis. 6.  Aortic Atherosclerosis (ICD10-I70.0). Electronically Signed   By: Vanetta Chou M.D.   On: 10/04/2023 13:32     .Critical Care  Performed by: Hoy Nidia FALCON, PA-C Authorized by: Hoy Nidia FALCON, PA-C   Critical care provider statement:    Critical care time (minutes):  30   Critical care was necessary to treat or prevent imminent or life-threatening deterioration of the following conditions:  Sepsis   Critical care was time spent personally by me on the following activities:  Development of treatment plan with patient or surrogate, discussions with consultants, evaluation of patient's response to treatment, examination of patient, ordering and review of laboratory studies, ordering and review of radiographic studies, ordering and performing treatments and interventions, pulse oximetry, re-evaluation of  patient's condition and review of old charts   Care discussed with: admitting provider      Medications Ordered in the ED  lactated ringers  infusion (has no administration in time range)  chlorhexidine  (PERIDEX ) 0.12 % solution 15 mL (has no administration in time range)    Or  Oral care mouth rinse (has no administration in time range)  0.9 %  sodium chloride   infusion (has no administration in time range)  fentaNYL  (SUBLIMAZE ) injection 50 mcg (50 mcg Intravenous Given 10/04/23 1057)  ondansetron  (ZOFRAN ) injection 4 mg (4 mg Intravenous Given 10/04/23 1056)  ceFEPIme  (MAXIPIME ) 2 g in sodium chloride  0.9 % 100 mL IVPB (0 g Intravenous Stopped 10/04/23 1133)  metroNIDAZOLE  (FLAGYL ) IVPB 500 mg (0 mg Intravenous Stopped 10/04/23 1434)  lactated ringers  bolus 1,000 mL (0 mLs Intravenous Stopped 10/04/23 1434)  lactated ringers  bolus 1,000 mL (0 mLs Intravenous Stopped 10/04/23 1434)  fentaNYL  (SUBLIMAZE ) injection 50 mcg (50 mcg Intravenous Given 10/04/23 1156)  ondansetron  (ZOFRAN ) injection 4 mg (4 mg Intravenous Given 10/04/23 1156)  HYDROmorphone  (DILAUDID ) injection 0.5 mg (0.5 mg Intravenous Given 10/04/23 1317)  diphenhydrAMINE  (BENADRYL ) injection 25 mg (25 mg Intravenous Given 10/04/23 1317)  HYDROmorphone  (DILAUDID ) injection 0.5 mg (0.5 mg Intravenous Given 10/04/23 1325)    Clinical Course as of 10/04/23 1512  Sun Oct 04, 2023  1100 Evaluated; nontoxic-appearing.  Benign abdominal exam.  She is tachycardic and with leukocytosis with evidence of UTI.  Receiving antibiotics and fluids.  Awaiting CT scan for dispo [TY]    Clinical Course User Index [TY] Neysa Caron PARAS, DO                                 Medical Decision Making Amount and/or Complexity of Data Reviewed Labs: ordered. Radiology: ordered.  Risk Prescription drug management.    This patient presents to the ED for concern of abdominal pain, this involves an extensive number of treatment options, and is a complaint  that carries with it a high risk of complications and morbidity.  The differential diagnosis includes gastroenteritis, colitis, small bowel obstruction, appendicitis, cholecystitis, pancreatitis, nephrolithiasis, UTI, pyelonephritis   Co morbidities that complicate the patient evaluation  asthma, diverticulosis, GERD, HLD, HTN   Additional history obtained:  03/2020 ECHO: 50-55% EF   Problem List / ED Course / Critical interventions / Medication management  Patient presented for lower abdominal pain. Physical exam reassuring. Patient initially tachycardic, but other vitals are reassuring.  I Ordered, and personally interpreted labs.  Initial lactic acid 3.7.  CBC with leukocytosis of 24.5.  No anemia.  CMP with AKI with BUN/creatinine elevated at 28/2.02.  CBG 164.  UA concerning for infection with positive nitrites, moderate leukocytes, 11-20 WBC. I ordered imaging studies including CT Renal Study. I independently visualized and interpreted imaging and I agree with the radiologist interpretation of kidney stones. I personally viewed and interpreted the EKG/cardiac monitored which showed an underlying rhythm of: sinus tachycardia I requested consultation with the urologist on-call Dr. Shane,  and discussed lab and imaging findings as well as pertinent plan - they recommend taking patient for a stent now. Dr. CHARLENA Blanch admitting provider. I have reviewed the patients home medicines and have made adjustments as needed   Social Determinants of Health:  none       Final diagnoses:  Sepsis with acute renal failure without septic shock, due to unspecified organism, unspecified acute renal failure type [facility])  Acute cystitis with hematuria  Nephrolithiasis    ED Discharge Orders     None          Hoy Nidia FALCON, [place] 10/04/23 1527    Neysa Caron PARAS, DO 10/05/23 0701

## 2023-10-05 ENCOUNTER — Inpatient Hospital Stay (HOSPITAL_COMMUNITY): Payer: Self-pay

## 2023-10-05 ENCOUNTER — Encounter (HOSPITAL_COMMUNITY): Payer: Self-pay | Admitting: Urology

## 2023-10-05 DIAGNOSIS — B9620 Unspecified Escherichia coli [E. coli] as the cause of diseases classified elsewhere: Secondary | ICD-10-CM

## 2023-10-05 DIAGNOSIS — R7881 Bacteremia: Secondary | ICD-10-CM

## 2023-10-05 DIAGNOSIS — N200 Calculus of kidney: Secondary | ICD-10-CM

## 2023-10-05 LAB — BLOOD CULTURE ID PANEL (REFLEXED) - BCID2

## 2023-10-05 MED ORDER — OXYCODONE HCL 5 MG PO TABS
5.0000 mg | ORAL_TABLET | Freq: Four times a day (QID) | ORAL | Status: DC | PRN
  Administered 2023-10-05: 5 mg via ORAL
  Filled 2023-10-05: qty 1

## 2023-10-05 MED ORDER — OXYCODONE HCL 5 MG PO TABS
5.0000 mg | ORAL_TABLET | ORAL | Status: DC | PRN
  Administered 2023-10-05 – 2023-10-08 (×14): 5 mg via ORAL
  Filled 2023-10-05 (×14): qty 1

## 2023-10-05 MED ORDER — CHLORHEXIDINE GLUCONATE CLOTH 2 % EX PADS
6.0000 | MEDICATED_PAD | Freq: Every day | CUTANEOUS | Status: DC
  Administered 2023-10-05 – 2023-10-08 (×4): 6 via TOPICAL

## 2023-10-05 MED ORDER — SODIUM CHLORIDE 0.9 % IV SOLN
2.0000 g | INTRAVENOUS | Status: DC
  Filled 2023-10-05: qty 20

## 2023-10-05 MED ORDER — METHOCARBAMOL 500 MG PO TABS
500.0000 mg | ORAL_TABLET | Freq: Once | ORAL | Status: AC
  Administered 2023-10-06: 500 mg via ORAL
  Filled 2023-10-05 (×2): qty 1

## 2023-10-05 MED ORDER — PIPERACILLIN-TAZOBACTAM 3.375 G IVPB
3.3750 g | Freq: Three times a day (TID) | INTRAVENOUS | Status: DC
  Administered 2023-10-05 – 2023-10-07 (×6): 3.375 g via INTRAVENOUS
  Filled 2023-10-05 (×8): qty 50

## 2023-10-05 NOTE — Progress Notes (Addendum)
 PROGRESS NOTE    [name]  FMW:[id number] DOB: 1960-09-23 DOA: 10/04/2023 PCP: Norleen Lynwood ORN, MD   Brief Narrative:  This 63 years old female with PMH significant for asthma, diverticulosis, GERD, hyperlipidemia, hypertension presented to the ED with complaints of right-sided flank pain associated with urinary retention.  Patient also reports dysuria, fever and chills.  CT imaging showed 8 mm stone or 2 adjacent stones in the distal right ureter with right-sided hydronephrosis.  Patient admitted for sepsis secondary to UTI in the setting of ureteral stone.  She is also found to have AKI, lactic acidosis, leukocytosis.  Urology is consulted.  Patient is status post cystoscopy with ureteral stent placement tolerated well.   Assessment & Plan:   Principal Problem:   Sepsis secondary to UTI (HCC)  Sepsis secondary to UTI in the setting of ureteral stone: Ureteral obstruction with hydronephrosis: Patient presented with right-sided flank pain associated with fever , dysuria , chills and urinary retention.   She is found to have sepsis.  Continued on empiric IV antibiotics per sepsis protocol. Continue to trend lactic acid while on IV hydration. Initiated on empiric antibiotics ( zosyn  for intraabdominal infection). Blood culture shows Enterobacter, sensitivity pending Urology consulted.   status post cystoscopy with ureteral stent placement.  Tolerated well. Continue to monitor renal functions.  Abnormal LFTs: RUQ ultrasound shows cholelithiasis without acute cholecystitis. Continue to trend LFTs.  Alcohol use: Patient does not show any signs of alcohol withdrawal.  Morbid obesity: Diet and exercise discussed in detail. Patient will need sleep apnea workup as an outpatient  Atrial flutter: Currently remains in sinus rhythm. HR controlled. Continue metoprolol .  AKI on CKD stage IIIa : Her baseline creatinine remains around 1.09 > creatinine on admission 2.2. Avoid  nephrotoxic medications, continue gentle hydration. Continue to monitor serum creatinine  History of PE: Resume Xarelto .  DVT prophylaxis: Xarelto  Code Status: Full code Family Communication: No family at bedside. Disposition Plan: Status is: Inpatient Remains inpatient appropriate because: Severity of illness.    Consultants:  Infectious Diseases Urology  Procedures: Cystoscopy with ureteral stent placement Antimicrobials:  Anti-infectives (From admission, onward)    Start     Dose/Rate Route Frequency Ordered Stop   10/05/23 1700  vancomycin  (VANCOREADY) IVPB 1250 mg/250 mL  Status:  Discontinued        1,250 mg 166.7 mL/hr over 90 Minutes Intravenous Every 24 hours 10/04/23 1810 10/05/23 0322   10/05/23 1400  piperacillin -tazobactam (ZOSYN ) IVPB 3.375 g        3.375 g 12.5 mL/hr over 240 Minutes Intravenous Every 8 hours 10/05/23 1050     10/05/23 1000  cefTRIAXone  (ROCEPHIN ) 2 g in sodium chloride  0.9 % 100 mL IVPB  Status:  Discontinued        2 g 200 mL/hr over 30 Minutes Intravenous Every 24 hours 10/05/23 0322 10/05/23 1050   10/04/23 2300  ceFEPIme  (MAXIPIME ) 2 g in sodium chloride  0.9 % 100 mL IVPB  Status:  Discontinued        2 g 200 mL/hr over 30 Minutes Intravenous Every 12 hours 10/04/23 1810 10/05/23 0322   10/04/23 1630  vancomycin  (VANCOREADY) IVPB 1500 mg/300 mL        1,500 mg 150 mL/hr over 120 Minutes Intravenous  Once 10/04/23 1616 10/04/23 1828   10/04/23 1045  ceFEPIme  (MAXIPIME ) 2 g in sodium chloride  0.9 % 100 mL IVPB        2 g 200 mL/hr over 30 Minutes Intravenous  Once  10/04/23 1031 10/04/23 1133   10/04/23 1045  metroNIDAZOLE  (FLAGYL ) IVPB 500 mg        500 mg 100 mL/hr over 60 Minutes Intravenous  Once 10/04/23 1031 10/04/23 1434       Subjective: Patient was seen and examined at bedside.  Overnight events noted. Patient is status post right ureteral stent placement,  tolerated well. Patient continued to complain about pelvic pressure,  continue to have elevated WBC count 24K.   Objective: Vitals:   10/04/23 2046 10/05/23 0500 10/05/23 0742 10/05/23 0853  BP: 130/72 114/77  118/73  Pulse:  89  92  Resp: 18 18    Temp: 98.1 F (36.7 C) 98.1 F (36.7 C)  98.3 F (36.8 C)  TempSrc: Oral Oral  Oral  SpO2:  92% 94%   Weight:      Height:        Intake/Output Summary (Last 24 hours) at 10/05/2023 1058 Last data filed at 10/05/2023 0500 Gross per 24 hour  Intake 900 ml  Output 1155 ml  Net -255 ml   Filed Weights   10/04/23 1502  Weight: 133.4 kg    Examination:  General exam: Appears calm and comfortable, not in any acute distress. Respiratory system: Clear to auscultation. Respiratory effort normal.  RR 14 Cardiovascular system: S1 & S2 heard, RRR. No JVD, murmurs, rubs, gallops or clicks. No pedal edema. Gastrointestinal system: Abdomen is non distended, soft and mildly tender. Normal bowel sounds heard. Central nervous system: Alert and oriented x 3. No focal neurological deficits. Extremities: No edema, no cyanosis, no clubbing Skin: No rashes, lesions or ulcers Psychiatry: Judgement and insight appear normal. Mood & affect appropriate.   Data Reviewed: I have personally reviewed following labs and imaging studies  CBC: Recent Labs  Lab 10/04/23 0949  WBC 24.5*  HGB 13.2  HCT 42.4  MCV 97.7  PLT 188   Basic Metabolic Panel: Recent Labs  Lab 10/04/23 0949  NA 139  K 3.9  CL 104  CO2 21*  GLUCOSE 162*  BUN 28*  CREATININE 2.02*  CALCIUM  8.9   GFR: Estimated Creatinine Clearance: 38 mL/min (A) (by C-G formula based on SCr of 2.02 mg/dL (H)). Liver Function Tests: Recent Labs  Lab 10/04/23 0949  AST 125*  ALT 90*  ALKPHOS 115  BILITOT 0.8  PROT 7.2  ALBUMIN 3.0*   No results for input(s): LIPASE, AMYLASE in the last 168 hours. No results for input(s): AMMONIA in the last 168 hours. Coagulation Profile: No results for input(s): INR, PROTIME in the last 168  hours. Cardiac Enzymes: No results for input(s): CKTOTAL, CKMB, CKMBINDEX, TROPONINI in the last 168 hours. BNP (last 3 results) No results for input(s): PROBNP in the last 8760 hours. HbA1C: Recent Labs    10/04/23 1912  HGBA1C 6.2*   CBG: Recent Labs  Lab 10/04/23 0953  GLUCAP 164*   Lipid Profile: No results for input(s): CHOL, HDL, LDLCALC, TRIG, CHOLHDL, LDLDIRECT in the last 72 hours. Thyroid  Function Tests: No results for input(s): TSH, T4TOTAL, FREET4, T3FREE, THYROIDAB in the last 72 hours. Anemia Panel: No results for input(s): VITAMINB12, FOLATE, FERRITIN, TIBC, IRON, RETICCTPCT in the last 72 hours. Sepsis Labs: Recent Labs  Lab 10/04/23 1124  LATICACIDVEN 3.7*    Recent Results (from the past 240 hours)  Blood culture (routine x 2)     Status: Abnormal (Preliminary result)   Collection Time: 10/04/23 10:25 AM   Specimen: BLOOD RIGHT HAND  Result Value Ref Range  Status   Specimen Description BLOOD RIGHT HAND  Final   Special Requests   Final    BOTTLES DRAWN AEROBIC AND ANAEROBIC Blood Culture results may not be optimal due to an inadequate volume of blood received in culture bottles   Culture  Setup Time   Final    GRAM NEGATIVE RODS IN BOTH AEROBIC AND ANAEROBIC BOTTLES Organism ID to follow    Culture (A)  Final    ESCHERICHIA COLI CULTURE REINCUBATED FOR BETTER GROWTH Performed at [facility], [address], [place], [place] [address]    Report Status PENDING  Incomplete  Blood Culture ID Panel (Reflexed)     Status: Abnormal   Collection Time: 10/04/23 10:25 AM  Result Value Ref Range Status   Enterococcus faecalis NOT DETECTED NOT DETECTED Final   Enterococcus Faecium NOT DETECTED NOT DETECTED Final   Listeria monocytogenes NOT DETECTED NOT DETECTED Final   Staphylococcus species NOT DETECTED NOT DETECTED Final   Staphylococcus aureus (BCID) NOT DETECTED NOT DETECTED Final   Staphylococcus  epidermidis NOT DETECTED NOT DETECTED Final   Staphylococcus lugdunensis NOT DETECTED NOT DETECTED Final   Streptococcus species NOT DETECTED NOT DETECTED Final   Streptococcus agalactiae NOT DETECTED NOT DETECTED Final   Streptococcus pneumoniae NOT DETECTED NOT DETECTED Final   Streptococcus pyogenes NOT DETECTED NOT DETECTED Final   A.calcoaceticus-baumannii NOT DETECTED NOT DETECTED Final   Bacteroides fragilis NOT DETECTED NOT DETECTED Final   Enterobacterales DETECTED (A) NOT DETECTED Final    Comment: Enterobacterales represent a large order of gram negative bacteria, not a single organism. CRITICAL RESULT CALLED TO, READ BACK BY AND VERIFIED WITH: PHARMD J LEDFORD 10/05/2023 @ 0230 BY AB    Enterobacter cloacae complex NOT DETECTED NOT DETECTED Final   Escherichia coli DETECTED (A) NOT DETECTED Final    Comment: CRITICAL RESULT CALLED TO, READ BACK BY AND VERIFIED WITH: PHARMD J LEDFORD 10/05/2023 @ 0230 BY AB    Klebsiella aerogenes NOT DETECTED NOT DETECTED Final   Klebsiella oxytoca NOT DETECTED NOT DETECTED Final   Klebsiella pneumoniae NOT DETECTED NOT DETECTED Final   Proteus species NOT DETECTED NOT DETECTED Final   Salmonella species NOT DETECTED NOT DETECTED Final   Serratia marcescens NOT DETECTED NOT DETECTED Final   Haemophilus influenzae NOT DETECTED NOT DETECTED Final   Neisseria meningitidis NOT DETECTED NOT DETECTED Final   Pseudomonas aeruginosa NOT DETECTED NOT DETECTED Final   Stenotrophomonas maltophilia NOT DETECTED NOT DETECTED Final   Candida albicans NOT DETECTED NOT DETECTED Final   Candida auris NOT DETECTED NOT DETECTED Final   Candida glabrata NOT DETECTED NOT DETECTED Final   Candida krusei NOT DETECTED NOT DETECTED Final   Candida parapsilosis NOT DETECTED NOT DETECTED Final   Candida tropicalis NOT DETECTED NOT DETECTED Final   Cryptococcus neoformans/gattii NOT DETECTED NOT DETECTED Final   CTX-M ESBL NOT DETECTED NOT DETECTED Final    Carbapenem resistance IMP NOT DETECTED NOT DETECTED Final   Carbapenem resistance KPC NOT DETECTED NOT DETECTED Final   Carbapenem resistance NDM NOT DETECTED NOT DETECTED Final   Carbapenem resist OXA 48 LIKE NOT DETECTED NOT DETECTED Final   Carbapenem resistance VIM NOT DETECTED NOT DETECTED Final    Comment: Performed at [facility], [address], [place], [place] [address]  Blood culture (routine x 2)     Status: None (Preliminary result)   Collection Time: 10/04/23 10:30 AM   Specimen: BLOOD  Result Value Ref Range Status  Specimen Description BLOOD RIGHT ANTECUBITAL  Final   Special Requests   Final    BOTTLES DRAWN AEROBIC AND ANAEROBIC Blood Culture results may not be optimal due to an inadequate volume of blood received in culture bottles   Culture  Setup Time   Final    GRAM NEGATIVE RODS IN BOTH AEROBIC AND ANAEROBIC BOTTLES CRITICAL VALUE NOTED.  VALUE IS CONSISTENT WITH PREVIOUSLY REPORTED AND CALLED VALUE.    Culture   Final    GRAM NEGATIVE RODS CULTURE REINCUBATED FOR BETTER GROWTH Performed at [facility], [address], [place], [place] [address]    Report Status PENDING  Incomplete  Aerobic/Anaerobic Culture w Gram Stain (surgical/deep wound)     Status: None (Preliminary result)   Collection Time: 10/04/23  4:38 PM   Specimen: Path fluid; Body Fluid  Result Value Ref Range Status   Specimen Description FLUID  Final   Special Requests RIGHT RENAL PELVIS  Final   Gram Stain   Final    RARE WBC PRESENT, PREDOMINANTLY PMN FEW GRAM POSITIVE COCCI FEW GRAM NEGATIVE RODS    Culture   Final    RARE GRAM NEGATIVE RODS CULTURE REINCUBATED FOR BETTER GROWTH SUSCEPTIBILITIES TO FOLLOW Performed at [facility], [address] [place], [place] [address]    Report Status PENDING  Incomplete    Radiology Studies: US  Abdomen Limited RUQ (LIVER/GB) Result Date: 10/05/2023 CLINICAL DATA:  [id number] Abnormal LFTs [id number] EXAM: ULTRASOUND ABDOMEN  LIMITED RIGHT UPPER QUADRANT COMPARISON:  CT renal 10/04/2023 FINDINGS: Gallbladder: Calcified stones within the gallbladder lumen. No gallbladder wall thickening or pericholecystic fluid visualized. No sonographic Murphy sign noted by sonographer. Common bile duct: Diameter: 4 mm Liver: No focal lesion identified. Increased parenchymal echogenicity. Portal vein is patent on color Doppler imaging with normal direction of blood flow towards the liver. Other: None. IMPRESSION: 1. Cholelithiasis with no acute cholecystitis. 2. Hepatic steatosis. Please note limited evaluation for focal hepatic masses in a patient with hepatic steatosis due to decreased penetration of the acoustic ultrasound waves. Electronically Signed   By: Morgane  Naveau M.D.   On: 10/05/2023 00:53   DG C-Arm 1-60 Min-No Report Result Date: 10/04/2023 Fluoroscopy was utilized by the requesting physician.  No radiographic interpretation.   CT Renal Stone Study Result Date: 10/04/2023 CLINICAL DATA:  Abdominal pain.  Concern for kidney stone. EXAM: CT ABDOMEN AND PELVIS WITHOUT CONTRAST TECHNIQUE: Multidetector CT imaging of the abdomen and pelvis was performed following the standard protocol without IV contrast. RADIATION DOSE REDUCTION: This exam was performed according to the departmental dose-optimization program which includes automated exposure control, adjustment of the mA and/or kV according to patient size and/or use of iterative reconstruction technique. COMPARISON:  CT dated 08/14/2022. FINDINGS: Evaluation of this exam is limited in the absence of intravenous contrast. Lower chest: The visualized lung bases are clear. No intra-abdominal free air or free fluid. Hepatobiliary: The liver is mildly enlarged measuring 17 cm in midclavicular length. There is diffuse fatty infiltration of the liver. Several small lesions within the liver measure up to 3.5 cm in the dome of the liver are similar to prior CT and likely represent areas of fat  sparing or hemangioma. No biliary dilatation. Tiny gallstones noted. No pericholecystic fluid or evidence of acute cholecystitis by CT. Pancreas: Unremarkable. No pancreatic ductal dilatation or surrounding inflammatory changes. Spleen: Normal in size without focal abnormality. Adrenals/Urinary Tract: The adrenal glands are unremarkable. Nonobstructing bilateral renal calculi measure 1 cm in  the inferior pole of the right kidney and 14 mm in the left kidney. No hydronephrosis on the left. There is an 8 mm stone or 2 adjacent stones in the distal right ureter. There is mild right hydronephrosis. The urinary bladder is collapsed. Stomach/Bowel: There is diffuse colonic diverticulosis. There is no bowel obstruction or active inflammation. The appendix is normal. Vascular/Lymphatic: Moderate aortoiliac atherosclerotic disease. The IVC is unremarkable. No portal venous gas. There is no adenopathy. Reproductive: Hysterectomy.  No suspicious adnexal masses. Other: Small fat containing umbilical hernia. Musculoskeletal: Degenerative changes of the spine. No acute osseous pathology. IMPRESSION: 1. An 8 mm stone or two adjacent stones in the distal right ureter with mild right hydronephrosis. 2. Additional nonobstructing bilateral renal calculi. 3. Colonic diverticulosis. No bowel obstruction. Normal appendix. 4. Fatty liver. 5. Cholelithiasis. 6.  Aortic Atherosclerosis (ICD10-I70.0). Electronically Signed   By: Vanetta Chou M.D.   On: 10/04/2023 13:32   Scheduled Meds:  atorvastatin   20 mg Oral Daily   fluticasone  furoate-vilanterol  1 puff Inhalation Daily   metoprolol  succinate  25 mg Oral Daily   rivaroxaban   20 mg Oral Q supper   Continuous Infusions:  piperacillin -tazobactam (ZOSYN )  IV       LOS: 1 day    Time spent: 50 mins    Darcel Dawley, MD [facility]   If 7PM-7AM, please contact night-coverage

## 2023-10-05 NOTE — Plan of Care (Signed)
  Problem: Skin Integrity: Goal: Risk for impaired skin integrity will decrease Outcome: Progressing   Problem: Safety: Goal: Ability to remain free from injury will improve Outcome: Progressing   Problem: Pain Managment: Goal: General experience of comfort will improve and/or be controlled Outcome: Progressing   Problem: Elimination: Goal: Will not experience complications related to bowel motility Outcome: Progressing Goal: Will not experience complications related to urinary retention Outcome: Progressing   Problem: Activity: Goal: Risk for activity intolerance will decrease Outcome: Progressing   Problem: Nutrition: Goal: Adequate nutrition will be maintained Outcome: Progressing

## 2023-10-05 NOTE — Progress Notes (Signed)
 PHARMACY - PHYSICIAN COMMUNICATION CRITICAL VALUE ALERT - BLOOD CULTURE IDENTIFICATION (BCID)  [name] is an 63 y.o. female who presented to [facility] on 10/04/2023 with a chief complaint of UTI/obstruction   Name of physician (or Provider) Contacted: Dr. Patsy   Current antibiotics: Vancomycin , Cefepime   Changes to prescribed antibiotics recommended:  DC Vancomycin  and Cefepime  Start Ceftriaxone  2g IV q24h  Results for orders placed or performed during the hospital encounter of 10/04/23  Blood Culture ID Panel (Reflexed) (Collected: 10/04/2023 10:25 AM)  Result Value Ref Range   Enterococcus faecalis NOT DETECTED NOT DETECTED   Enterococcus Faecium NOT DETECTED NOT DETECTED   Listeria monocytogenes NOT DETECTED NOT DETECTED   Staphylococcus species NOT DETECTED NOT DETECTED   Staphylococcus aureus (BCID) NOT DETECTED NOT DETECTED   Staphylococcus epidermidis NOT DETECTED NOT DETECTED   Staphylococcus lugdunensis NOT DETECTED NOT DETECTED   Streptococcus species NOT DETECTED NOT DETECTED   Streptococcus agalactiae NOT DETECTED NOT DETECTED   Streptococcus pneumoniae NOT DETECTED NOT DETECTED   Streptococcus pyogenes NOT DETECTED NOT DETECTED   A.calcoaceticus-baumannii NOT DETECTED NOT DETECTED   Bacteroides fragilis NOT DETECTED NOT DETECTED   Enterobacterales DETECTED (A) NOT DETECTED   Enterobacter cloacae complex NOT DETECTED NOT DETECTED   Escherichia coli DETECTED (A) NOT DETECTED   Klebsiella aerogenes NOT DETECTED NOT DETECTED   Klebsiella oxytoca NOT DETECTED NOT DETECTED   Klebsiella pneumoniae NOT DETECTED NOT DETECTED   Proteus species NOT DETECTED NOT DETECTED   Salmonella species NOT DETECTED NOT DETECTED   Serratia marcescens NOT DETECTED NOT DETECTED   Haemophilus influenzae NOT DETECTED NOT DETECTED   Neisseria meningitidis NOT DETECTED NOT DETECTED   Pseudomonas aeruginosa NOT DETECTED NOT DETECTED   Stenotrophomonas maltophilia NOT DETECTED NOT  DETECTED   Candida albicans NOT DETECTED NOT DETECTED   Candida auris NOT DETECTED NOT DETECTED   Candida glabrata NOT DETECTED NOT DETECTED   Candida krusei NOT DETECTED NOT DETECTED   Candida parapsilosis NOT DETECTED NOT DETECTED   Candida tropicalis NOT DETECTED NOT DETECTED   Cryptococcus neoformans/gattii NOT DETECTED NOT DETECTED   CTX-M ESBL NOT DETECTED NOT DETECTED   Carbapenem resistance IMP NOT DETECTED NOT DETECTED   Carbapenem resistance KPC NOT DETECTED NOT DETECTED   Carbapenem resistance NDM NOT DETECTED NOT DETECTED   Carbapenem resist OXA 48 LIKE NOT DETECTED NOT DETECTED   Carbapenem resistance VIM NOT DETECTED NOT DETECTED    Clair Agent 10/05/2023  3:23 AM

## 2023-10-05 NOTE — Progress Notes (Signed)
 1 Day Post-Op Subjective: Patient doing okay having significant urethral pain as well as pressure in vagina.  This is likely from the urethral catheter.  Objective: Vital signs in last 24 hours: Temp:  [97.6 F (36.4 C)-98.6 F (37 C)] 98.1 F (36.7 C) (08/25 0500) Pulse Rate:  [89-111] 89 (08/25 0500) Resp:  [13-26] 18 (08/25 0500) BP: (105-134)/(64-89) 114/77 (08/25 0500) SpO2:  [92 %-98 %] 92 % (08/25 0500) Weight:  [133.4 kg] 133.4 kg (08/24 1502)  Intake/Output from previous day: 08/24 0701 - 08/25 0700 In: 900 [I.V.:400; IV Piggyback:500] Out: 1155 [Urine:1150; Blood:5] Intake/Output this shift: No intake/output data recorded.  Physical Exam:  General: Alert and oriented CV: RRR Lungs: Clear GU: Foley in place draining clear yellow urine  Lab Results: Recent Labs    10/04/23 0949  HGB 13.2  HCT 42.4   BMET Recent Labs    10/04/23 0949  NA 139  K 3.9  CL 104  CO2 21*  GLUCOSE 162*  BUN 28*  CREATININE 2.02*  CALCIUM  8.9     Studies/Results: US  Abdomen Limited RUQ (LIVER/GB) Result Date: 10/05/2023 CLINICAL DATA:  [id number] Abnormal LFTs [id number] EXAM: ULTRASOUND ABDOMEN LIMITED RIGHT UPPER QUADRANT COMPARISON:  CT renal 10/04/2023 FINDINGS: Gallbladder: Calcified stones within the gallbladder lumen. No gallbladder wall thickening or pericholecystic fluid visualized. No sonographic Murphy sign noted by sonographer. Common bile duct: Diameter: 4 mm Liver: No focal lesion identified. Increased parenchymal echogenicity. Portal vein is patent on color Doppler imaging with normal direction of blood flow towards the liver. Other: None. IMPRESSION: 1. Cholelithiasis with no acute cholecystitis. 2. Hepatic steatosis. Please note limited evaluation for focal hepatic masses in a patient with hepatic steatosis due to decreased penetration of the acoustic ultrasound waves. Electronically Signed   By: Morgane  Naveau M.D.   On: 10/05/2023 00:53   DG C-Arm 1-60 Min-No  Report Result Date: 10/04/2023 Fluoroscopy was utilized by the requesting physician.  No radiographic interpretation.   CT Renal Stone Study Result Date: 10/04/2023 CLINICAL DATA:  Abdominal pain.  Concern for kidney stone. EXAM: CT ABDOMEN AND PELVIS WITHOUT CONTRAST TECHNIQUE: Multidetector CT imaging of the abdomen and pelvis was performed following the standard protocol without IV contrast. RADIATION DOSE REDUCTION: This exam was performed according to the departmental dose-optimization program which includes automated exposure control, adjustment of the mA and/or kV according to patient size and/or use of iterative reconstruction technique. COMPARISON:  CT dated 08/14/2022. FINDINGS: Evaluation of this exam is limited in the absence of intravenous contrast. Lower chest: The visualized lung bases are clear. No intra-abdominal free air or free fluid. Hepatobiliary: The liver is mildly enlarged measuring 17 cm in midclavicular length. There is diffuse fatty infiltration of the liver. Several small lesions within the liver measure up to 3.5 cm in the dome of the liver are similar to prior CT and likely represent areas of fat sparing or hemangioma. No biliary dilatation. Tiny gallstones noted. No pericholecystic fluid or evidence of acute cholecystitis by CT. Pancreas: Unremarkable. No pancreatic ductal dilatation or surrounding inflammatory changes. Spleen: Normal in size without focal abnormality. Adrenals/Urinary Tract: The adrenal glands are unremarkable. Nonobstructing bilateral renal calculi measure 1 cm in the inferior pole of the right kidney and 14 mm in the left kidney. No hydronephrosis on the left. There is an 8 mm stone or 2 adjacent stones in the distal right ureter. There is mild right hydronephrosis. The urinary bladder is collapsed. Stomach/Bowel: There is diffuse colonic diverticulosis. There is no bowel  obstruction or active inflammation. The appendix is normal. Vascular/Lymphatic: Moderate  aortoiliac atherosclerotic disease. The IVC is unremarkable. No portal venous gas. There is no adenopathy. Reproductive: Hysterectomy.  No suspicious adnexal masses. Other: Small fat containing umbilical hernia. Musculoskeletal: Degenerative changes of the spine. No acute osseous pathology. IMPRESSION: 1. An 8 mm stone or two adjacent stones in the distal right ureter with mild right hydronephrosis. 2. Additional nonobstructing bilateral renal calculi. 3. Colonic diverticulosis. No bowel obstruction. Normal appendix. 4. Fatty liver. 5. Cholelithiasis. 6.  Aortic Atherosclerosis (ICD10-I70.0). Electronically Signed   By: Vanetta Chou M.D.   On: 10/04/2023 13:32    Assessment/Plan: 63 year old female with obstructive right pyelonephritis status post stent.  # Right pyelonephritis - Continue stent - Foley catheter can be removed today. - If urethral symptoms persist recommend starting oxybutynin  - Culture still pending continue vancomycin  and cefepime  - Urology will eventually need to set up follow-up for outpatient ureteroscopy.   LOS: 1 day   Jackey Pea MD 10/05/2023, 7:22 AM [facility]

## 2023-10-05 NOTE — Consult Note (Signed)
 [facility]    Date of Admission:  10/04/2023     Reason for Consult: sepsis/bacteremia    Referring Provider: Leotis Bogus      Abx: 8-25-c piptazo  8/24-25 cefepime /flagyl  8/24 vanc        Assessment: 63 yo female admitted with pyelo/sepsis due to obstructing renal stone  Renal ct protocol bilateral renal stone but obstructing on the right ureter   8/24 bcx ecoli (pending sensitivity -- no esbl gene detected) 8/24 fluid cx (renal pelvis during cystoscopy) gpc and gnr  8/24 cystoscopy, right retrograde pyelogram and right ureteral stent placement. 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 Right renal pelvis sampling done as above   Patient doing well now   Lft up not yet rechecked but likely due to sepsis. No sign of biliary infection  Plan: Given gpc on renal pelvis which could be e faecalis, will change abx to piptazo for now F/u culture Maintain standard isolation precaution Pending final cx, no further w/u needed at this time Discussed with primary team       ------------------------------------------------ Principal Problem:   Sepsis secondary to UTI (HCC)    HPI: CANISHA ISSAC is a 63 y.o. female hx asthma, gerd, admitted with syx of uti and sepsis, found to have obstructive right renal pelvis and e coli bacteremia   Ct showed right ureteral obstructive stone Urology consulted and placed right renal stent Initial bcx ecoli; right renal pelvis sampling has gnr and gpc  Repsonding after stent placement and abx  Wbc 24 initially Afebrile but subjective fever Initial cr 2 (baseline 1) Mild lft elevation cholelithiasis on u/s but negative sonographic murphy sign and no clinical sx of cholecystitis   Family History  Problem Relation Age of Onset   Stroke Mother    COPD Father    Lymphoma Sister    Alcoholism Sister     Social History   Tobacco Use   Smoking status: Former    Current packs/day:  0.00    Average packs/day: 0.5 packs/day for 7.0 years (3.5 ttl pk-yrs)    Types: Cigarettes    Start date: 02/11/1976    Quit date: 02/11/1983    Years since quitting: 40.6   Smokeless tobacco: Never  Vaping Use   Vaping status: Never Used  Substance Use Topics   Alcohol use: Yes    Alcohol/week: 1.0 standard drink of alcohol    Types: 1 Shots of liquor per week    Comment: daily half pint   Drug use: Not Currently    Types: Marijuana    Comment: smoked marijuana x 10 years.  Quit in 1985.    Allergies  Allergen Reactions   Morphine  Itching   Shrimp [Shellfish Allergy] Itching and Other (See Comments)    Tongue burns also   Tramadol  Other (See Comments)    Caused confusion   Covid-19 (Mrna) Vaccine Hives   Diltiazem  Hcl Itching    Pt with itching of the feet when bolus given   Latex Rash    Review of Systems: ROS All Other ROS was negative, except mentioned above   Past Medical History:  Diagnosis Date   Abscess of bladder 07/28/2016   Alcohol dependence (HCC)    Allergic rhinitis 12/06/2013   Anxiety    Asthma 03/16/2015   Atrial flutter with rapid ventricular response (HCC) 04/01/2020   COLONIC POLYPS, HX OF 04/05/2010   DIVERTICULITIS, HX OF 04/05/2010  DJD (degenerative joint disease)    right knee, mot to severe   Dysrhythmia    GERD (gastroesophageal reflux disease)    no meds   Heart murmur    hx of    History of kidney stones    Hyperlipidemia    Hypertension    Impaired glucose tolerance 12/06/2013   Morbid obesity with BMI of 50.0-59.9, adult (HCC)    Nausea & vomiting 05/24/2022   Peripheral vascular disease (HCC)    Pulmonary embolism (HCC)        Scheduled Meds:  atorvastatin   20 mg Oral Daily   fluticasone  furoate-vilanterol  1 puff Inhalation Daily   metoprolol  succinate  25 mg Oral Daily   rivaroxaban   20 mg Oral Q supper   Continuous Infusions:  piperacillin -tazobactam (ZOSYN )  IV 3.375 g (10/05/23 1511)   PRN Meds:.albuterol ,  LORazepam , oxyCODONE    OBJECTIVE: Blood pressure 135/84, pulse 83, temperature 98.3 F (36.8 C), temperature source Oral, resp. rate (!) 22, height 5' 2.01 (1.575 m), weight 133.4 kg, SpO2 96%.  Physical Exam  General/constitutional: no distress, pleasant HEENT: Normocephalic, PER, Conj Clear, EOMI, Oropharynx clear Neck supple CV: rrr no mrg Lungs: clear to auscultation, normal respiratory effort Abd: Soft, Nontender Ext: no edema Skin: No Rash Neuro: nonfocal MSK: no peripheral joint swelling/tenderness/warmth; back spines nontender    Lab Results Lab Results  Component Value Date   WBC 24.5 (H) 10/04/2023   HGB 13.2 10/04/2023   HCT 42.4 10/04/2023   MCV 97.7 10/04/2023   PLT 188 10/04/2023    Lab Results  Component Value Date   CREATININE 2.02 (H) 10/04/2023   BUN 28 (H) 10/04/2023   NA 139 10/04/2023   K 3.9 10/04/2023   CL 104 10/04/2023   CO2 21 (L) 10/04/2023    Lab Results  Component Value Date   ALT 90 (H) 10/04/2023   AST 125 (H) 10/04/2023   ALKPHOS 115 10/04/2023   BILITOT 0.8 10/04/2023      Microbiology: Recent Results (from the past 240 hours)  Blood culture (routine x 2)     Status: Abnormal (Preliminary result)   Collection Time: 10/04/23 10:25 AM   Specimen: BLOOD RIGHT HAND  Result Value Ref Range Status   Specimen Description BLOOD RIGHT HAND  Final   Special Requests   Final    BOTTLES DRAWN AEROBIC AND ANAEROBIC Blood Culture results may not be optimal due to an inadequate volume of blood received in culture bottles   Culture  Setup Time   Final    GRAM NEGATIVE RODS IN BOTH AEROBIC AND ANAEROBIC BOTTLES Organism ID to follow    Culture (A)  Final    ESCHERICHIA COLI CULTURE REINCUBATED FOR BETTER GROWTH Performed at [facility], [address], [place], [place] [address]    Report Status PENDING  Incomplete  Blood Culture ID Panel (Reflexed)     Status: Abnormal   Collection Time: 10/04/23 10:25 AM  Result Value  Ref Range Status   Enterococcus faecalis NOT DETECTED NOT DETECTED Final   Enterococcus Faecium NOT DETECTED NOT DETECTED Final   Listeria monocytogenes NOT DETECTED NOT DETECTED Final   Staphylococcus species NOT DETECTED NOT DETECTED Final   Staphylococcus aureus (BCID) NOT DETECTED NOT DETECTED Final   Staphylococcus epidermidis NOT DETECTED NOT DETECTED Final   Staphylococcus lugdunensis NOT DETECTED NOT DETECTED Final   Streptococcus species NOT DETECTED NOT DETECTED Final   Streptococcus agalactiae NOT DETECTED NOT DETECTED Final   Streptococcus  pneumoniae NOT DETECTED NOT DETECTED Final   Streptococcus pyogenes NOT DETECTED NOT DETECTED Final   A.calcoaceticus-baumannii NOT DETECTED NOT DETECTED Final   Bacteroides fragilis NOT DETECTED NOT DETECTED Final   Enterobacterales DETECTED (A) NOT DETECTED Final    Comment: Enterobacterales represent a large order of gram negative bacteria, not a single organism. CRITICAL RESULT CALLED TO, READ BACK BY AND VERIFIED WITH: PHARMD J LEDFORD 10/05/2023 @ 0230 BY AB    Enterobacter cloacae complex NOT DETECTED NOT DETECTED Final   Escherichia coli DETECTED (A) NOT DETECTED Final    Comment: CRITICAL RESULT CALLED TO, READ BACK BY AND VERIFIED WITH: PHARMD J LEDFORD 10/05/2023 @ 0230 BY AB    Klebsiella aerogenes NOT DETECTED NOT DETECTED Final   Klebsiella oxytoca NOT DETECTED NOT DETECTED Final   Klebsiella pneumoniae NOT DETECTED NOT DETECTED Final   Proteus species NOT DETECTED NOT DETECTED Final   Salmonella species NOT DETECTED NOT DETECTED Final   Serratia marcescens NOT DETECTED NOT DETECTED Final   Haemophilus influenzae NOT DETECTED NOT DETECTED Final   Neisseria meningitidis NOT DETECTED NOT DETECTED Final   Pseudomonas aeruginosa NOT DETECTED NOT DETECTED Final   Stenotrophomonas maltophilia NOT DETECTED NOT DETECTED Final   Candida albicans NOT DETECTED NOT DETECTED Final   Candida auris NOT DETECTED NOT DETECTED Final    Candida glabrata NOT DETECTED NOT DETECTED Final   Candida krusei NOT DETECTED NOT DETECTED Final   Candida parapsilosis NOT DETECTED NOT DETECTED Final   Candida tropicalis NOT DETECTED NOT DETECTED Final   Cryptococcus neoformans/gattii NOT DETECTED NOT DETECTED Final   CTX-M ESBL NOT DETECTED NOT DETECTED Final   Carbapenem resistance IMP NOT DETECTED NOT DETECTED Final   Carbapenem resistance KPC NOT DETECTED NOT DETECTED Final   Carbapenem resistance NDM NOT DETECTED NOT DETECTED Final   Carbapenem resist OXA 48 LIKE NOT DETECTED NOT DETECTED Final   Carbapenem resistance VIM NOT DETECTED NOT DETECTED Final    Comment: Performed at [facility], [address] [place], [place] [address]  Blood culture (routine x 2)     Status: None (Preliminary result)   Collection Time: 10/04/23 10:30 AM   Specimen: BLOOD  Result Value Ref Range Status   Specimen Description BLOOD RIGHT ANTECUBITAL  Final   Special Requests   Final    BOTTLES DRAWN AEROBIC AND ANAEROBIC Blood Culture results may not be optimal due to an inadequate volume of blood received in culture bottles   Culture  Setup Time   Final    GRAM NEGATIVE RODS IN BOTH AEROBIC AND ANAEROBIC BOTTLES CRITICAL VALUE NOTED.  VALUE IS CONSISTENT WITH PREVIOUSLY REPORTED AND CALLED VALUE.    Culture   Final    GRAM NEGATIVE RODS CULTURE REINCUBATED FOR BETTER GROWTH Performed at [facility], [address] [place], [place] [address]    Report Status PENDING  Incomplete  Aerobic/Anaerobic Culture w Gram Stain (surgical/deep wound)     Status: None (Preliminary result)   Collection Time: 10/04/23  4:38 PM   Specimen: Path fluid; Body Fluid  Result Value Ref Range Status   Specimen Description FLUID  Final   Special Requests RIGHT RENAL PELVIS  Final   Gram Stain   Final    RARE WBC PRESENT, PREDOMINANTLY PMN FEW GRAM POSITIVE COCCI FEW GRAM NEGATIVE RODS    Culture   Final    RARE GRAM NEGATIVE RODS CULTURE  REINCUBATED FOR BETTER GROWTH SUSCEPTIBILITIES TO FOLLOW Performed at [facility]  Lab, [address], [place], [place] [address]    Report Status PENDING  Incomplete     Serology:    Imaging: If present, new imagings (plain films, ct scans, and mri) have been personally visualized and interpreted; radiology reports have been reviewed. Decision making incorporated into the Impression / Recommendations.  8/24 ct renal protocol 1. An 8 mm stone or two adjacent stones in the distal right ureter with mild right hydronephrosis. 2. Additional nonobstructing bilateral renal calculi. 3. Colonic diverticulosis. No bowel obstruction. Normal appendix. 4. Fatty liver. 5. Cholelithiasis.   8/25 liver u/s 1. Cholelithiasis with no acute cholecystitis. 2. Hepatic steatosis. Please note limited evaluation for focal hepatic masses in a patient with hepatic steatosis due to decreased penetration of the acoustic ultrasound waves.        Constance ONEIDA Passer, MD [facility] [phone] pager    10/05/2023, 5:23 PM

## 2023-10-06 DIAGNOSIS — N390 Urinary tract infection, site not specified: Secondary | ICD-10-CM | POA: Diagnosis not present

## 2023-10-06 DIAGNOSIS — A419 Sepsis, unspecified organism: Secondary | ICD-10-CM | POA: Diagnosis not present

## 2023-10-06 LAB — CBC
HCT: 39.4 % (ref 36.0–46.0)
Hemoglobin: 12.6 g/dL (ref 12.0–15.0)
MCH: 30 pg (ref 26.0–34.0)
MCHC: 32 g/dL (ref 30.0–36.0)
MCV: 93.8 fL (ref 80.0–100.0)
Platelets: 196 K/uL (ref 150–400)
RBC: 4.2 MIL/uL (ref 3.87–5.11)
RDW: 14.4 % (ref 11.5–15.5)
WBC: 14 K/uL — ABNORMAL HIGH (ref 4.0–10.5)
nRBC: 0 % (ref 0.0–0.2)

## 2023-10-06 LAB — COMPREHENSIVE METABOLIC PANEL WITH GFR
ALT: 62 U/L — ABNORMAL HIGH (ref 0–44)
AST: 46 U/L — ABNORMAL HIGH (ref 15–41)
Albumin: 2.8 g/dL — ABNORMAL LOW (ref 3.5–5.0)
Alkaline Phosphatase: 88 U/L (ref 38–126)
Anion gap: 11 (ref 5–15)
BUN: 41 mg/dL — ABNORMAL HIGH (ref 8–23)
CO2: 22 mmol/L (ref 22–32)
Calcium: 8.8 mg/dL — ABNORMAL LOW (ref 8.9–10.3)
Chloride: 102 mmol/L (ref 98–111)
Creatinine, Ser: 1.7 mg/dL — ABNORMAL HIGH (ref 0.44–1.00)
GFR, Estimated: 34 mL/min — ABNORMAL LOW (ref >60)
Glucose, Bld: 138 mg/dL — ABNORMAL HIGH (ref 70–99)
Potassium: 3.8 mmol/L (ref 3.5–5.1)
Sodium: 135 mmol/L (ref 135–145)
Total Bilirubin: 0.6 mg/dL (ref 0.0–1.2)
Total Protein: 6.9 g/dL (ref 6.5–8.1)

## 2023-10-06 LAB — PHOSPHORUS: Phosphorus: 2.9 mg/dL (ref 2.5–4.6)

## 2023-10-06 LAB — MAGNESIUM: Magnesium: 1.6 mg/dL — ABNORMAL LOW (ref 1.7–2.4)

## 2023-10-06 LAB — LACTIC ACID, PLASMA: Lactic Acid, Venous: 1.7 mmol/L (ref 0.5–1.9)

## 2023-10-06 MED ORDER — MENTHOL 3 MG MT LOZG
1.0000 | LOZENGE | OROMUCOSAL | Status: DC | PRN
  Administered 2023-10-06: 3 mg via ORAL
  Filled 2023-10-06: qty 9

## 2023-10-06 MED ORDER — MAGNESIUM SULFATE 2 GM/50ML IV SOLN
2.0000 g | Freq: Once | INTRAVENOUS | Status: AC
  Administered 2023-10-06: 2 g via INTRAVENOUS
  Filled 2023-10-06: qty 50

## 2023-10-06 MED ORDER — OXYBUTYNIN CHLORIDE 5 MG PO TABS
5.0000 mg | ORAL_TABLET | Freq: Three times a day (TID) | ORAL | Status: DC
  Administered 2023-10-06 – 2023-10-08 (×7): 5 mg via ORAL
  Filled 2023-10-06 (×7): qty 1

## 2023-10-06 MED ORDER — FAMOTIDINE 20 MG PO TABS
20.0000 mg | ORAL_TABLET | Freq: Every day | ORAL | Status: DC
  Administered 2023-10-07 – 2023-10-08 (×2): 20 mg via ORAL
  Filled 2023-10-06 (×2): qty 1

## 2023-10-06 NOTE — Plan of Care (Signed)
  Problem: Skin Integrity: Goal: Risk for impaired skin integrity will decrease Outcome: Progressing   Problem: Safety: Goal: Ability to remain free from injury will improve Outcome: Progressing   Problem: Pain Managment: Goal: General experience of comfort will improve and/or be controlled Outcome: Progressing   Problem: Elimination: Goal: Will not experience complications related to bowel motility Outcome: Progressing Goal: Will not experience complications related to urinary retention Outcome: Progressing

## 2023-10-06 NOTE — Progress Notes (Signed)
 PROGRESS NOTE    [name]  FMW:[id number] DOB: August 01, 1960 DOA: 10/04/2023 PCP: Norleen Lynwood ORN, MD   Brief Narrative:  This 63 years old female with PMH significant for asthma, diverticulosis, GERD, hyperlipidemia, hypertension presented to the ED with complaints of right-sided flank pain associated with urinary retention.  Patient also reports dysuria, fever and chills.  CT imaging showed 8 mm stone or 2 adjacent stones in the distal right ureter with right-sided hydronephrosis.  Patient admitted for sepsis secondary to UTI in the setting of ureteral stone.  She is also found to have AKI, lactic acidosis, leukocytosis.  Urology is consulted.  Patient is status post cystoscopy with ureteral stent placement tolerated well.   Assessment & Plan:   Principal Problem:   Sepsis secondary to UTI (HCC)  Sepsis secondary to UTI in the setting of ureteral stone: Ureteral obstruction with hydronephrosis: Patient presented with right-sided flank pain associated with fever , dysuria , chills and urinary retention.   She is found to have sepsis.  Continued on empiric IV antibiotics per sepsis protocol. Lactic acid normalized with IV hydration. Initiated on empiric antibiotics ( zosyn  for intraabdominal infection). Blood culture shows Enterobacter, sensitivity pending Urology consulted.   status post cystoscopy with ureteral stent placement.  Tolerated well. Continue to monitor renal functions.  Continue stents and needs 14 days of antibiotics culture sensitive. Started on Oxybutynin  and Robaxin  for urinary discomfort.  Remove Foley catheter and do voiding trial.  Abnormal LFTs: RUQ ultrasound shows cholelithiasis without acute cholecystitis. Continue to trend LFTs.  Alcohol use: Patient does not show any signs of alcohol withdrawal.  Morbid obesity: Diet and exercise discussed in detail. Patient will need sleep apnea workup as an outpatient.  Atrial flutter: Currently remains in sinus  rhythm. HR controlled. Continue metoprolol .  AKI on CKD stage IIIa : > Improving Her baseline creatinine remains around 1.09 > creatinine on admission 2.2. Avoid nephrotoxic medications, continue gentle hydration. Continue to monitor serum creatinine  History of PE: Resume Xarelto .  DVT prophylaxis: Xarelto  Code Status: Full code Family Communication: No family at bedside. Disposition Plan: Status is: Inpatient Remains inpatient appropriate because: Severity of illness.    Consultants:  Infectious Diseases Urology  Procedures: Cystoscopy with ureteral stent placement Antimicrobials:  Anti-infectives (From admission, onward)    Start     Dose/Rate Route Frequency Ordered Stop   10/05/23 1700  vancomycin  (VANCOREADY) IVPB 1250 mg/250 mL  Status:  Discontinued        1,250 mg 166.7 mL/hr over 90 Minutes Intravenous Every 24 hours 10/04/23 1810 10/05/23 0322   10/05/23 1400  piperacillin -tazobactam (ZOSYN ) IVPB 3.375 g        3.375 g 12.5 mL/hr over 240 Minutes Intravenous Every 8 hours 10/05/23 1050     10/05/23 1000  cefTRIAXone  (ROCEPHIN ) 2 g in sodium chloride  0.9 % 100 mL IVPB  Status:  Discontinued        2 g 200 mL/hr over 30 Minutes Intravenous Every 24 hours 10/05/23 0322 10/05/23 1050   10/04/23 2300  ceFEPIme  (MAXIPIME ) 2 g in sodium chloride  0.9 % 100 mL IVPB  Status:  Discontinued        2 g 200 mL/hr over 30 Minutes Intravenous Every 12 hours 10/04/23 1810 10/05/23 0322   10/04/23 1630  vancomycin  (VANCOREADY) IVPB 1500 mg/300 mL        1,500 mg 150 mL/hr over 120 Minutes Intravenous  Once 10/04/23 1616 10/04/23 1828   10/04/23 1045  ceFEPIme  (MAXIPIME ) 2  g in sodium chloride  0.9 % 100 mL IVPB        2 g 200 mL/hr over 30 Minutes Intravenous  Once 10/04/23 1031 10/04/23 1133   10/04/23 1045  metroNIDAZOLE  (FLAGYL ) IVPB 500 mg        500 mg 100 mL/hr over 60 Minutes Intravenous  Once 10/04/23 1031 10/04/23 1434       Subjective: Patient was seen and  examined at bedside. Overnight events noted. Patient is status post right ureteral stent placement,  tolerated well. Patient reports feeling better,  still reports having pressure feeling in the lower pelvis.   Objective: Vitals:   10/06/23 0419 10/06/23 0807 10/06/23 0841 10/06/23 1029  BP: (!) 127/91  122/85 138/77  Pulse: 81   (!) 102  Resp: 18     Temp: 97.9 F (36.6 C)  97.8 F (36.6 C)   TempSrc: Oral  Oral   SpO2: 94% 95% 96%   Weight:      Height:        Intake/Output Summary (Last 24 hours) at 10/06/2023 1057 Last data filed at 10/06/2023 1033 Gross per 24 hour  Intake 1200.01 ml  Output 900 ml  Net 300.01 ml   Filed Weights   10/04/23 1502  Weight: 133.4 kg    Examination:  General exam: Appears calm and comfortable, not in any acute distress. Respiratory system: CTA Bilaterally. Respiratory effort normal.  RR 14 Cardiovascular system: S1 & S2 heard, RRR. No JVD, murmurs, rubs, gallops or clicks. No pedal edema. Gastrointestinal system: Abdomen is non distended, soft and mildly tender. Normal bowel sounds heard. Central nervous system: Alert and oriented x 3. No focal neurological deficits. Extremities: No edema, no cyanosis, no clubbing Skin: No rashes, lesions or ulcers Psychiatry: Judgement and insight appear normal. Mood & affect appropriate.   Data Reviewed: I have personally reviewed following labs and imaging studies  CBC: Recent Labs  Lab 10/04/23 0949 10/06/23 0631  WBC 24.5* 14.0*  HGB 13.2 12.6  HCT 42.4 39.4  MCV 97.7 93.8  PLT 188 196   Basic Metabolic Panel: Recent Labs  Lab 10/04/23 0949 10/06/23 0631  NA 139 135  K 3.9 3.8  CL 104 102  CO2 21* 22  GLUCOSE 162* 138*  BUN 28* 41*  CREATININE 2.02* 1.70*  CALCIUM  8.9 8.8*  MG  --  1.6*  PHOS  --  2.9   GFR: Estimated Creatinine Clearance: 45.2 mL/min (A) (by C-G formula based on SCr of 1.7 mg/dL (H)). Liver Function Tests: Recent Labs  Lab 10/04/23 0949 10/06/23 0631   AST 125* 46*  ALT 90* 62*  ALKPHOS 115 88  BILITOT 0.8 0.6  PROT 7.2 6.9  ALBUMIN 3.0* 2.8*   No results for input(s): LIPASE, AMYLASE in the last 168 hours. No results for input(s): AMMONIA in the last 168 hours. Coagulation Profile: No results for input(s): INR, PROTIME in the last 168 hours. Cardiac Enzymes: No results for input(s): CKTOTAL, CKMB, CKMBINDEX, TROPONINI in the last 168 hours. BNP (last 3 results) No results for input(s): PROBNP in the last 8760 hours. HbA1C: Recent Labs    10/04/23 1912  HGBA1C 6.2*   CBG: Recent Labs  Lab 10/04/23 0953  GLUCAP 164*   Lipid Profile: No results for input(s): CHOL, HDL, LDLCALC, TRIG, CHOLHDL, LDLDIRECT in the last 72 hours. Thyroid  Function Tests: No results for input(s): TSH, T4TOTAL, FREET4, T3FREE, THYROIDAB in the last 72 hours. Anemia Panel: No results for input(s): VITAMINB12, FOLATE, FERRITIN, TIBC,  IRON, RETICCTPCT in the last 72 hours. Sepsis Labs: Recent Labs  Lab 10/04/23 1124 10/06/23 0852  LATICACIDVEN 3.7* 1.7    Recent Results (from the past 240 hours)  Blood culture (routine x 2)     Status: Abnormal (Preliminary result)   Collection Time: 10/04/23 10:25 AM   Specimen: BLOOD RIGHT HAND  Result Value Ref Range Status   Specimen Description BLOOD RIGHT HAND  Final   Special Requests   Final    BOTTLES DRAWN AEROBIC AND ANAEROBIC Blood Culture results may not be optimal due to an inadequate volume of blood received in culture bottles   Culture  Setup Time   Final    GRAM NEGATIVE RODS IN BOTH AEROBIC AND ANAEROBIC BOTTLES Organism ID to follow    Culture (A)  Final    ESCHERICHIA COLI SUSCEPTIBILITIES TO FOLLOW Performed at [facility], [address] [place], [place] [address]    Report Status PENDING  Incomplete  Blood Culture ID Panel (Reflexed)     Status: Abnormal   Collection Time: 10/04/23 10:25 AM  Result Value Ref Range  Status   Enterococcus faecalis NOT DETECTED NOT DETECTED Final   Enterococcus Faecium NOT DETECTED NOT DETECTED Final   Listeria monocytogenes NOT DETECTED NOT DETECTED Final   Staphylococcus species NOT DETECTED NOT DETECTED Final   Staphylococcus aureus (BCID) NOT DETECTED NOT DETECTED Final   Staphylococcus epidermidis NOT DETECTED NOT DETECTED Final   Staphylococcus lugdunensis NOT DETECTED NOT DETECTED Final   Streptococcus species NOT DETECTED NOT DETECTED Final   Streptococcus agalactiae NOT DETECTED NOT DETECTED Final   Streptococcus pneumoniae NOT DETECTED NOT DETECTED Final   Streptococcus pyogenes NOT DETECTED NOT DETECTED Final   A.calcoaceticus-baumannii NOT DETECTED NOT DETECTED Final   Bacteroides fragilis NOT DETECTED NOT DETECTED Final   Enterobacterales DETECTED (A) NOT DETECTED Final    Comment: Enterobacterales represent a large order of gram negative bacteria, not a single organism. CRITICAL RESULT CALLED TO, READ BACK BY AND VERIFIED WITH: PHARMD J LEDFORD 10/05/2023 @ 0230 BY AB    Enterobacter cloacae complex NOT DETECTED NOT DETECTED Final   Escherichia coli DETECTED (A) NOT DETECTED Final    Comment: CRITICAL RESULT CALLED TO, READ BACK BY AND VERIFIED WITH: PHARMD J LEDFORD 10/05/2023 @ 0230 BY AB    Klebsiella aerogenes NOT DETECTED NOT DETECTED Final   Klebsiella oxytoca NOT DETECTED NOT DETECTED Final   Klebsiella pneumoniae NOT DETECTED NOT DETECTED Final   Proteus species NOT DETECTED NOT DETECTED Final   Salmonella species NOT DETECTED NOT DETECTED Final   Serratia marcescens NOT DETECTED NOT DETECTED Final   Haemophilus influenzae NOT DETECTED NOT DETECTED Final   Neisseria meningitidis NOT DETECTED NOT DETECTED Final   Pseudomonas aeruginosa NOT DETECTED NOT DETECTED Final   Stenotrophomonas maltophilia NOT DETECTED NOT DETECTED Final   Candida albicans NOT DETECTED NOT DETECTED Final   Candida auris NOT DETECTED NOT DETECTED Final   Candida  glabrata NOT DETECTED NOT DETECTED Final   Candida krusei NOT DETECTED NOT DETECTED Final   Candida parapsilosis NOT DETECTED NOT DETECTED Final   Candida tropicalis NOT DETECTED NOT DETECTED Final   Cryptococcus neoformans/gattii NOT DETECTED NOT DETECTED Final   CTX-M ESBL NOT DETECTED NOT DETECTED Final   Carbapenem resistance IMP NOT DETECTED NOT DETECTED Final   Carbapenem resistance KPC NOT DETECTED NOT DETECTED Final   Carbapenem resistance NDM NOT DETECTED NOT DETECTED Final   Carbapenem resist OXA 48 LIKE NOT DETECTED NOT DETECTED  Final   Carbapenem resistance VIM NOT DETECTED NOT DETECTED Final    Comment: Performed at [facility], [address], [place], [place] [address]  Blood culture (routine x 2)     Status: None (Preliminary result)   Collection Time: 10/04/23 10:30 AM   Specimen: BLOOD  Result Value Ref Range Status   Specimen Description BLOOD RIGHT ANTECUBITAL  Final   Special Requests   Final    BOTTLES DRAWN AEROBIC AND ANAEROBIC Blood Culture results may not be optimal due to an inadequate volume of blood received in culture bottles   Culture  Setup Time   Final    GRAM NEGATIVE RODS IN BOTH AEROBIC AND ANAEROBIC BOTTLES CRITICAL VALUE NOTED.  VALUE IS CONSISTENT WITH PREVIOUSLY REPORTED AND CALLED VALUE.    Culture   Final    GRAM NEGATIVE RODS IDENTIFICATION TO FOLLOW Performed at [facility], [address] [place], [place] [address]    Report Status PENDING  Incomplete  Aerobic/Anaerobic Culture w Gram Stain (surgical/deep wound)     Status: None (Preliminary result)   Collection Time: 10/04/23  4:38 PM   Specimen: Path fluid; Body Fluid  Result Value Ref Range Status   Specimen Description FLUID  Final   Special Requests RIGHT RENAL PELVIS  Final   Gram Stain   Final    RARE WBC PRESENT, PREDOMINANTLY PMN FEW GRAM POSITIVE COCCI FEW GRAM NEGATIVE RODS Performed at [facility], [address], [place], [place] [address]     Culture RARE ESCHERICHIA COLI  Final   Report Status PENDING  Incomplete   Organism ID, Bacteria ESCHERICHIA COLI  Final      Susceptibility   Escherichia coli - MIC*    AMPICILLIN 4 SENSITIVE Sensitive     CEFAZOLIN  (NON-URINE) <=1 SENSITIVE Sensitive     CEFEPIME  <=0.12 SENSITIVE Sensitive     ERTAPENEM <=0.12 SENSITIVE Sensitive     CEFTRIAXONE  <=0.25 SENSITIVE Sensitive     CIPROFLOXACIN  <=0.06 SENSITIVE Sensitive     GENTAMICIN 8 INTERMEDIATE Intermediate     MEROPENEM <=0.25 SENSITIVE Sensitive     TRIMETH/SULFA <=20 SENSITIVE Sensitive     AMPICILLIN/SULBACTAM <=2 SENSITIVE Sensitive     PIP/TAZO Value in next row Sensitive ug/mL     <=4 SENSITIVEThis is a modified FDA-approved test that has been validated and its performance characteristics determined by the reporting laboratory.  This laboratory is certified under the Clinical Laboratory Improvement Amendments CLIA as qualified to perform high complexity clinical laboratory testing.    * RARE ESCHERICHIA COLI    Radiology Studies: US  Abdomen Limited RUQ (LIVER/GB) Result Date: 10/05/2023 CLINICAL DATA:  [id number] Abnormal LFTs [id number] EXAM: ULTRASOUND ABDOMEN LIMITED RIGHT UPPER QUADRANT COMPARISON:  CT renal 10/04/2023 FINDINGS: Gallbladder: Calcified stones within the gallbladder lumen. No gallbladder wall thickening or pericholecystic fluid visualized. No sonographic Murphy sign noted by sonographer. Common bile duct: Diameter: 4 mm Liver: No focal lesion identified. Increased parenchymal echogenicity. Portal vein is patent on color Doppler imaging with normal direction of blood flow towards the liver. Other: None. IMPRESSION: 1. Cholelithiasis with no acute cholecystitis. 2. Hepatic steatosis. Please note limited evaluation for focal hepatic masses in a patient with hepatic steatosis due to decreased penetration of the acoustic ultrasound waves. Electronically Signed   By: Morgane  Naveau M.D.   On: 10/05/2023 00:53   DG C-Arm 1-60  Min-No Report Result Date: 10/04/2023 Fluoroscopy was utilized by the requesting physician.  No radiographic interpretation.  CT Renal Stone Study Result Date: 10/04/2023 CLINICAL DATA:  Abdominal pain.  Concern for kidney stone. EXAM: CT ABDOMEN AND PELVIS WITHOUT CONTRAST TECHNIQUE: Multidetector CT imaging of the abdomen and pelvis was performed following the standard protocol without IV contrast. RADIATION DOSE REDUCTION: This exam was performed according to the departmental dose-optimization program which includes automated exposure control, adjustment of the mA and/or kV according to patient size and/or use of iterative reconstruction technique. COMPARISON:  CT dated 08/14/2022. FINDINGS: Evaluation of this exam is limited in the absence of intravenous contrast. Lower chest: The visualized lung bases are clear. No intra-abdominal free air or free fluid. Hepatobiliary: The liver is mildly enlarged measuring 17 cm in midclavicular length. There is diffuse fatty infiltration of the liver. Several small lesions within the liver measure up to 3.5 cm in the dome of the liver are similar to prior CT and likely represent areas of fat sparing or hemangioma. No biliary dilatation. Tiny gallstones noted. No pericholecystic fluid or evidence of acute cholecystitis by CT. Pancreas: Unremarkable. No pancreatic ductal dilatation or surrounding inflammatory changes. Spleen: Normal in size without focal abnormality. Adrenals/Urinary Tract: The adrenal glands are unremarkable. Nonobstructing bilateral renal calculi measure 1 cm in the inferior pole of the right kidney and 14 mm in the left kidney. No hydronephrosis on the left. There is an 8 mm stone or 2 adjacent stones in the distal right ureter. There is mild right hydronephrosis. The urinary bladder is collapsed. Stomach/Bowel: There is diffuse colonic diverticulosis. There is no bowel obstruction or active inflammation. The appendix is normal. Vascular/Lymphatic:  Moderate aortoiliac atherosclerotic disease. The IVC is unremarkable. No portal venous gas. There is no adenopathy. Reproductive: Hysterectomy.  No suspicious adnexal masses. Other: Small fat containing umbilical hernia. Musculoskeletal: Degenerative changes of the spine. No acute osseous pathology. IMPRESSION: 1. An 8 mm stone or two adjacent stones in the distal right ureter with mild right hydronephrosis. 2. Additional nonobstructing bilateral renal calculi. 3. Colonic diverticulosis. No bowel obstruction. Normal appendix. 4. Fatty liver. 5. Cholelithiasis. 6.  Aortic Atherosclerosis (ICD10-I70.0). Electronically Signed   By: Vanetta Chou M.D.   On: 10/04/2023 13:32   Scheduled Meds:  atorvastatin   20 mg Oral Daily   Chlorhexidine  Gluconate Cloth  6 each Topical Daily   fluticasone  furoate-vilanterol  1 puff Inhalation Daily   metoprolol  succinate  25 mg Oral Daily   oxybutynin   5 mg Oral TID   rivaroxaban   20 mg Oral Q supper   Continuous Infusions:  magnesium  sulfate bolus IVPB 2 g (10/06/23 1027)   piperacillin -tazobactam (ZOSYN )  IV 3.375 g (10/06/23 0603)     LOS: 2 days    Time spent: 35 mins    Darcel Dawley, MD [facility]   If 7PM-7AM, please contact night-coverage

## 2023-10-06 NOTE — TOC Initial Note (Signed)
 Transition of Care [facility]) - Initial/Assessment Note    Patient Details  Name: [name] MRN: [id number] Date of Birth: 1960/04/12  Transition [facility]) CM/SW Contact:    Sudie Erminio Deems, RN Phone Number: 10/06/2023, 3:13 PM  Clinical Narrative: Patient presented for  hip pain and urinary retention. PTA patient was independent from home with support of spouse. Patient states she has a PCP and gets to appointments without any issues. Patient states she has insurance- card is not on file. Spouse to bring the card tonight after work and Engineer, drilling to make a copy front and back- CM will submit to admitting. No home needs identified during the visit. Case Manager will continue to follow for additional needs.            Expected Discharge Plan: Home/Self Care     Patient Goals and CMS Choice Patient states their goals for this hospitalization and ongoing recovery are:: Plan to return home with spouse once stable.   Choice offered to / list presented to : NA      Expected Discharge Plan and Services In-house Referral: NA Discharge Planning Services: CM Consult Post Acute Care Choice: NA Living arrangements for the past 2 months: Single Family Home                   DME Agency: NA                  Prior Living Arrangements/Services Living arrangements for the past 2 months: Single Family Home Lives with:: Spouse Patient language and need for interpreter reviewed:: Yes Do you feel safe going back to the place where you live?: Yes      Need for Family Participation in Patient Care: No (Comment) Care giver support system in place?: No (comment)   Criminal Activity/Legal Involvement Pertinent to Current Situation/Hospitalization: No - Comment as needed  Activities of Daily Living   ADL Screening (condition at time of admission) Independently performs ADLs?: Yes (appropriate for developmental age) Is the patient deaf or have difficulty hearing?: No Does  the patient have difficulty seeing, even when wearing glasses/contacts?: No Does the patient have difficulty concentrating, remembering, or making decisions?: No  Permission Sought/Granted Permission sought to share information with : Family Supports, Case Manager                Emotional Assessment Appearance:: Appears stated age Attitude/Demeanor/Rapport: Engaged Affect (typically observed): Appropriate Orientation: : Oriented to Self, Oriented to Place, Oriented to  Time, Oriented to Situation Alcohol / Substance Use: Not Applicable Psych Involvement: No (comment)  Admission diagnosis:  Sepsis secondary to UTI (HCC) [A41.9, N39.0] Patient Active Problem List   Diagnosis Date Noted   Sepsis secondary to UTI (HCC) 10/04/2023   Hypercoagulable state due to paroxysmal atrial fibrillation (HCC) 12/19/2022   Acute unilateral obstructive uropathy 08/14/2022   Chronic kidney disease, stage 3a (HCC) 08/14/2022   Alcohol withdrawal with perceptual disturbances (HCC) 05/24/2022   Nausea & vomiting 05/24/2022   Abdominal pain 05/24/2022   History of asthma 05/24/2022   Elevated AST (SGOT) 05/24/2022   Prediabetes 05/24/2022   Asthma, moderate persistent, poorly-controlled 11/13/2021   Cough 11/06/2021   Wheezing 11/06/2021   Chronic anticoagulation 11/06/2021   E coli bacteremia 09/06/2021   AKI (acute kidney injury) (HCC) 09/06/2021   Right ureteral stone 09/06/2021   Hydronephrosis of right kidney 09/06/2021   Acute pyelonephritis 09/06/2021   Alcohol dependence with withdrawal delirium (HCC) 09/02/2021   Vitamin  D deficiency 03/13/2021   Microhematuria 03/13/2021   Tear of distal tendon of biceps 11/16/2020   Positive blood cultures    Asthma exacerbation 07/22/2020   Hyperkalemia 07/22/2020   Paroxysmal atrial fibrillation (HCC) 04/01/2020   Atrial flutter with rapid ventricular response (HCC) 04/01/2020   Alcohol dependence with withdrawal (HCC) 12/19/2019   History of  pulmonary embolism 12/19/2019   Pneumonia due to COVID-19 virus 02/10/2019   Pulmonary embolism (HCC) 07/06/2018   Leukocytosis 09/22/2017   Rash 09/22/2017   Sepsis (HCC) 09/12/2016   Colovesical fistula 09/12/2016   Alcohol abuse 07/28/2016   Colonic diverticulum 07/28/2016   Hyperlipidemia 07/10/2016   Mass of left side of neck 07/10/2016   Head and neck lymphadenopathy 07/10/2016   Eustachian tube disorder 01/25/2016   Peripheral edema 03/16/2015   Asthma 03/16/2015   Right shoulder pain 03/16/2015   Hypokalemia 10/04/2014   Upper airway cough syndrome 10/03/2014   Morbid obesity with BMI of 50.0-59.9, adult (HCC) 10/03/2014   Lower back pain 05/25/2014   Bilateral shoulder pain 05/25/2014   Allergic rhinitis 12/06/2013   Impaired glucose tolerance 12/06/2013   Expected blood loss anemia 12/16/2012   Morbid obesity (HCC) 12/15/2012   S/P left TKA 12/14/2012   Goiter 11/26/2012   Preop exam for internal medicine 11/26/2012   Vaginal bleeding 04/30/2011   Colon polyps 02/10/2011   Eczema 02/10/2011   Depression 02/10/2011   Encounter for well adult exam with abnormal findings 09/27/2010   Anxiety state 04/05/2010   DIVERTICULITIS, HX OF 04/05/2010   PALPITATIONS, HX OF 09/14/2007   Essential hypertension 04/24/2007   VOCAL CORD DISORDER 04/24/2007   Extrinsic asthma 04/24/2007   GERD 04/24/2007   PCP:  Norleen Lynwood ORN, MD Pharmacy:   [facility] [phone] - [place], [place] - [address] AT [facility] [address] [place] [address] Phone: [phone] Fax: [phone]     Social Drivers of Health (SDOH) Social History: SDOH Screenings   Food Insecurity: Food Insecurity Present (10/04/2023)  Housing: Unknown (10/04/2023)  Transportation Needs: No Transportation Needs (10/04/2023)  Utilities: Not At Risk (10/04/2023)  Depression (PHQ2-9): Low Risk  (08/06/2023)  Financial Resource Strain: Medium Risk (11/07/2021)  Tobacco  Use: Medium Risk (10/04/2023)   SDOH Interventions:     Readmission Risk Interventions    10/06/2023    3:11 PM  Readmission Risk Prevention Plan  Transportation Screening Complete  PCP or Specialist Appt within 3-5 Days Complete  HRI or Home Care Consult Complete  Social Work Consult for Recovery Care Planning/Counseling Complete  Palliative Care Screening Not Applicable  Medication Review Oceanographer) Referral to Pharmacy

## 2023-10-06 NOTE — Progress Notes (Signed)
 2 Days Post-Op Subjective: Doing well having pain in bladder and vagina likely from catheter.  She has been afebrile and has 1 episode of tachycardia.  Blood cultures positive for E. coli.  Objective: Vital signs in last 24 hours: Temp:  [97.6 F (36.4 C)-98.6 F (37 C)] 97.9 F (36.6 C) (08/26 0419) Pulse Rate:  [81-95] 81 (08/26 0419) Resp:  [16-22] 18 (08/26 0419) BP: (107-135)/(73-91) 127/91 (08/26 0419) SpO2:  [94 %-97 %] 94 % (08/26 0419)  Intake/Output from previous day: 08/25 0701 - 08/26 0700 In: 960 [P.O.:860; IV Piggyback:100] Out: 700 [Urine:700] Intake/Output this shift: No intake/output data recorded.  Physical Exam:  General: Alert and oriented CV: RRR Lungs: Clear Abdomen: Soft, GU: Foley catheter in place draining slightly cloudy urine.  Lab Results: Recent Labs    10/04/23 0949 10/06/23 0631  HGB 13.2 12.6  HCT 42.4 39.4   BMET Recent Labs    10/04/23 0949 10/06/23 0631  NA 139 135  K 3.9 3.8  CL 104 102  CO2 21* 22  GLUCOSE 162* 138*  BUN 28* 41*  CREATININE 2.02* 1.70*  CALCIUM  8.9 8.8*     Studies/Results: US  Abdomen Limited RUQ (LIVER/GB) Result Date: 10/05/2023 CLINICAL DATA:  [id number] Abnormal LFTs [id number] EXAM: ULTRASOUND ABDOMEN LIMITED RIGHT UPPER QUADRANT COMPARISON:  CT renal 10/04/2023 FINDINGS: Gallbladder: Calcified stones within the gallbladder lumen. No gallbladder wall thickening or pericholecystic fluid visualized. No sonographic Murphy sign noted by sonographer. Common bile duct: Diameter: 4 mm Liver: No focal lesion identified. Increased parenchymal echogenicity. Portal vein is patent on color Doppler imaging with normal direction of blood flow towards the liver. Other: None. IMPRESSION: 1. Cholelithiasis with no acute cholecystitis. 2. Hepatic steatosis. Please note limited evaluation for focal hepatic masses in a patient with hepatic steatosis due to decreased penetration of the acoustic ultrasound waves. Electronically  Signed   By: Morgane  Naveau M.D.   On: 10/05/2023 00:53   DG C-Arm 1-60 Min-No Report Result Date: 10/04/2023 Fluoroscopy was utilized by the requesting physician.  No radiographic interpretation.   CT Renal Stone Study Result Date: 10/04/2023 CLINICAL DATA:  Abdominal pain.  Concern for kidney stone. EXAM: CT ABDOMEN AND PELVIS WITHOUT CONTRAST TECHNIQUE: Multidetector CT imaging of the abdomen and pelvis was performed following the standard protocol without IV contrast. RADIATION DOSE REDUCTION: This exam was performed according to the departmental dose-optimization program which includes automated exposure control, adjustment of the mA and/or kV according to patient size and/or use of iterative reconstruction technique. COMPARISON:  CT dated 08/14/2022. FINDINGS: Evaluation of this exam is limited in the absence of intravenous contrast. Lower chest: The visualized lung bases are clear. No intra-abdominal free air or free fluid. Hepatobiliary: The liver is mildly enlarged measuring 17 cm in midclavicular length. There is diffuse fatty infiltration of the liver. Several small lesions within the liver measure up to 3.5 cm in the dome of the liver are similar to prior CT and likely represent areas of fat sparing or hemangioma. No biliary dilatation. Tiny gallstones noted. No pericholecystic fluid or evidence of acute cholecystitis by CT. Pancreas: Unremarkable. No pancreatic ductal dilatation or surrounding inflammatory changes. Spleen: Normal in size without focal abnormality. Adrenals/Urinary Tract: The adrenal glands are unremarkable. Nonobstructing bilateral renal calculi measure 1 cm in the inferior pole of the right kidney and 14 mm in the left kidney. No hydronephrosis on the left. There is an 8 mm stone or 2 adjacent stones in the distal right ureter. There is mild right  hydronephrosis. The urinary bladder is collapsed. Stomach/Bowel: There is diffuse colonic diverticulosis. There is no bowel  obstruction or active inflammation. The appendix is normal. Vascular/Lymphatic: Moderate aortoiliac atherosclerotic disease. The IVC is unremarkable. No portal venous gas. There is no adenopathy. Reproductive: Hysterectomy.  No suspicious adnexal masses. Other: Small fat containing umbilical hernia. Musculoskeletal: Degenerative changes of the spine. No acute osseous pathology. IMPRESSION: 1. An 8 mm stone or two adjacent stones in the distal right ureter with mild right hydronephrosis. 2. Additional nonobstructing bilateral renal calculi. 3. Colonic diverticulosis. No bowel obstruction. Normal appendix. 4. Fatty liver. 5. Cholelithiasis. 6.  Aortic Atherosclerosis (ICD10-I70.0). Electronically Signed   By: Vanetta Chou M.D.   On: 10/04/2023 13:32    Assessment/Plan: 63 year old female with obstructive right pyelonephritis status post stent.   # Right pyelonephritis - Continue stent - Void trial to be completed today. - If urethral symptoms persist recommend starting oxybutynin  - Blood culture positive for E. coli, sensitivities pending recommending continuing cefepime  patient should be on antibiotics for total duration of 14 days - Urology will eventually need to set up follow-up for outpatient ureteroscopy.  Dr. Selma her primary urologist has been contacted.   LOS: 2 days   Jackey Pea MD 10/06/2023, 7:59 AM [facility]

## 2023-10-06 NOTE — Progress Notes (Addendum)
 Informed by CCMD Pt had an 8 beat run of SV tach at 2309. Patient is asymptomatic with no chest pain. Informed Dr. Lenon. Will continue to monitor.Strips saved.

## 2023-10-07 ENCOUNTER — Other Ambulatory Visit (HOSPITAL_COMMUNITY): Payer: Self-pay

## 2023-10-07 DIAGNOSIS — N390 Urinary tract infection, site not specified: Secondary | ICD-10-CM | POA: Diagnosis not present

## 2023-10-07 DIAGNOSIS — A419 Sepsis, unspecified organism: Secondary | ICD-10-CM | POA: Diagnosis not present

## 2023-10-07 LAB — BASIC METABOLIC PANEL WITH GFR
Anion gap: 4 — ABNORMAL LOW (ref 5–15)
BUN: 32 mg/dL — ABNORMAL HIGH (ref 8–23)
CO2: 28 mmol/L (ref 22–32)
Calcium: 9.1 mg/dL (ref 8.9–10.3)
Chloride: 105 mmol/L (ref 98–111)
Creatinine, Ser: 1.27 mg/dL — ABNORMAL HIGH (ref 0.44–1.00)
GFR, Estimated: 48 mL/min — ABNORMAL LOW (ref >60)
Glucose, Bld: 113 mg/dL — ABNORMAL HIGH (ref 70–99)
Potassium: 4.4 mmol/L (ref 3.5–5.1)
Sodium: 137 mmol/L (ref 135–145)

## 2023-10-07 LAB — CBC
HCT: 40.3 % (ref 36.0–46.0)
Hemoglobin: 12.8 g/dL (ref 12.0–15.0)
MCH: 30.4 pg (ref 26.0–34.0)
MCHC: 31.8 g/dL (ref 30.0–36.0)
MCV: 95.7 fL (ref 80.0–100.0)
Platelets: 196 K/uL (ref 150–400)
RBC: 4.21 MIL/uL (ref 3.87–5.11)
RDW: 14.3 % (ref 11.5–15.5)
WBC: 10.8 K/uL — ABNORMAL HIGH (ref 4.0–10.5)
nRBC: 0 % (ref 0.0–0.2)

## 2023-10-07 LAB — CULTURE, BLOOD (ROUTINE X 2)

## 2023-10-07 LAB — PHOSPHORUS: Phosphorus: 3.6 mg/dL (ref 2.5–4.6)

## 2023-10-07 LAB — MAGNESIUM: Magnesium: 1.9 mg/dL (ref 1.7–2.4)

## 2023-10-07 MED ORDER — AMOXICILLIN-POT CLAVULANATE 875-125 MG PO TABS
1.0000 | ORAL_TABLET | Freq: Two times a day (BID) | ORAL | Status: DC
  Administered 2023-10-07: 1 via ORAL
  Filled 2023-10-07: qty 1

## 2023-10-07 MED ORDER — AMOXICILLIN-POT CLAVULANATE 875-125 MG PO TABS
1.0000 | ORAL_TABLET | Freq: Three times a day (TID) | ORAL | 0 refills | Status: AC
  Filled 2023-10-07: qty 30, 10d supply, fill #0

## 2023-10-07 MED ORDER — AMOXICILLIN-POT CLAVULANATE 875-125 MG PO TABS
1.0000 | ORAL_TABLET | Freq: Three times a day (TID) | ORAL | Status: DC
  Administered 2023-10-07 – 2023-10-08 (×2): 1 via ORAL
  Filled 2023-10-07 (×2): qty 1

## 2023-10-07 MED ORDER — ONDANSETRON HCL 4 MG/2ML IJ SOLN
4.0000 mg | Freq: Three times a day (TID) | INTRAMUSCULAR | Status: DC | PRN
  Administered 2023-10-07 – 2023-10-08 (×3): 4 mg via INTRAVENOUS
  Filled 2023-10-07 (×3): qty 2

## 2023-10-07 NOTE — TOC CM/SW Note (Signed)
 CSW added food resources to patients AVS.

## 2023-10-07 NOTE — Progress Notes (Signed)
 [facility]  Date of Admission:  10/04/2023     Reason for Follow Up: Sepsis secondary to UTI [facility])  Total days of antibiotics 4         ASSESSMENT:  [name] is a 63 year old female admitted with pyelonephritis/sepsis secondary to obstructing renal stone and found to have E. coli bacteremia.  [name]'s E. coli are sensitive to amoxicillin /clavulanate and discussed plan of care to transition from piperacillin -tazobactam to amoxicillin /clavulanate with recommendation of 14 days of treatment.  Leukocytosis continues to downtrend.  Foley catheter has been removed.  No additional follow-up with ID needed.  Okay for discharge from ID standpoint.  Remaining medical and supportive care per internal medicine.   PLAN:  Change antibiotics to amoxicillin /clavulanate for a total of 2 weeks. Okay for discharge from ID standpoint. No additional follow-up in ID needed. Remaining medical and supportive care per internal medicine.  Principal Problem:   Sepsis secondary to UTI [facility]) Active Problems:   E coli bacteremia    amoxicillin -clavulanate  1 tablet Oral Q8H   atorvastatin   20 mg Oral Daily   Chlorhexidine  Gluconate Cloth  6 each Topical Daily   famotidine   20 mg Oral Q0600   fluticasone  furoate-vilanterol  1 puff Inhalation Daily   metoprolol  succinate  25 mg Oral Daily   oxybutynin   5 mg Oral TID   rivaroxaban   20 mg Oral Q supper    SUBJECTIVE:  Afebrile overnight with no acute events.  Tolerating antibiotics with no adverse side effects.  Allergies  Allergen Reactions   Morphine  Itching   Shrimp [Shellfish Allergy] Itching and Other (See Comments)    Tongue burns also   Tramadol  Other (See Comments)    Caused confusion   Covid-19 (Mrna) Vaccine Hives   Diltiazem  Hcl Itching    Pt with itching of the feet when bolus given   Latex Rash     Review of Systems: Review of Systems  Constitutional:  Negative for chills, fever and weight  loss.  Respiratory:  Negative for cough, shortness of breath and wheezing.   Cardiovascular:  Negative for chest pain and leg swelling.  Gastrointestinal:  Negative for abdominal pain, constipation, diarrhea, nausea and vomiting.  Skin:  Negative for rash.      OBJECTIVE: Vitals:   10/07/23 0053 10/07/23 0500 10/07/23 0811 10/07/23 1202  BP: 124/89 129/85 133/82 122/80  Pulse: 84 70 87 89  Resp: 18 18 18 18   Temp: 97.6 F (36.4 C) 97.7 F (36.5 C) 97.8 F (36.6 C) 97.8 F (36.6 C)  TempSrc: Oral Oral Oral Oral  SpO2: 96% 95% 95% 96%  Weight:      Height:       Body mass index is 53.78 kg/m.  Physical Exam Constitutional:      General: She is not in acute distress.    Appearance: She is well-developed.  Cardiovascular:     Rate and Rhythm: Normal rate and regular rhythm.     Heart sounds: Normal heart sounds.  Pulmonary:     Effort: Pulmonary effort is normal.     Breath sounds: Normal breath sounds.  Skin:    General: Skin is warm and dry.  Neurological:     Mental Status: She is alert.  Psychiatric:        Mood and Affect: Mood normal.     Lab Results Lab Results  Component Value Date   WBC 10.8 (H) 10/07/2023   HGB 12.8 10/07/2023  HCT 40.3 10/07/2023   MCV 95.7 10/07/2023   PLT 196 10/07/2023    Lab Results  Component Value Date   CREATININE 1.27 (H) 10/07/2023   BUN 32 (H) 10/07/2023   NA 137 10/07/2023   K 4.4 10/07/2023   CL 105 10/07/2023   CO2 28 10/07/2023    Lab Results  Component Value Date   ALT 62 (H) 10/06/2023   AST 46 (H) 10/06/2023   ALKPHOS 88 10/06/2023   BILITOT 0.6 10/06/2023     Microbiology: Recent Results (from the past 240 hours)  Blood culture (routine x 2)     Status: Abnormal   Collection Time: 10/04/23 10:25 AM   Specimen: BLOOD RIGHT HAND  Result Value Ref Range Status   Specimen Description BLOOD RIGHT HAND  Final   Special Requests   Final    BOTTLES DRAWN AEROBIC AND ANAEROBIC Blood Culture results may  not be optimal due to an inadequate volume of blood received in culture bottles   Culture  Setup Time   Final    GRAM NEGATIVE RODS IN BOTH AEROBIC AND ANAEROBIC BOTTLES Organism ID to follow Performed at [facility], [address], [place], [place] [address]    Culture ESCHERICHIA COLI (A)  Final   Report Status 10/07/2023 FINAL  Final   Organism ID, Bacteria ESCHERICHIA COLI  Final      Susceptibility   Escherichia coli - MIC*    AMPICILLIN 4 SENSITIVE Sensitive     CEFAZOLIN  (NON-URINE) 2 SENSITIVE Sensitive     CEFEPIME  <=0.12 SENSITIVE Sensitive     ERTAPENEM <=0.12 SENSITIVE Sensitive     CEFTRIAXONE  <=0.25 SENSITIVE Sensitive     CIPROFLOXACIN  >=4 RESISTANT Resistant     GENTAMICIN <=1 SENSITIVE Sensitive     MEROPENEM <=0.25 SENSITIVE Sensitive     TRIMETH/SULFA <=20 SENSITIVE Sensitive     AMPICILLIN/SULBACTAM <=2 SENSITIVE Sensitive     PIP/TAZO Value in next row Sensitive ug/mL     <=4 SENSITIVEThis is a modified FDA-approved test that has been validated and its performance characteristics determined by the reporting laboratory.  This laboratory is certified under the Clinical Laboratory Improvement Amendments CLIA as qualified to perform high complexity clinical laboratory testing.    * ESCHERICHIA COLI  Blood Culture ID Panel (Reflexed)     Status: Abnormal   Collection Time: 10/04/23 10:25 AM  Result Value Ref Range Status   Enterococcus faecalis NOT DETECTED NOT DETECTED Final   Enterococcus Faecium NOT DETECTED NOT DETECTED Final   Listeria monocytogenes NOT DETECTED NOT DETECTED Final   Staphylococcus species NOT DETECTED NOT DETECTED Final   Staphylococcus aureus (BCID) NOT DETECTED NOT DETECTED Final   Staphylococcus epidermidis NOT DETECTED NOT DETECTED Final   Staphylococcus lugdunensis NOT DETECTED NOT DETECTED Final   Streptococcus species NOT DETECTED NOT DETECTED Final   Streptococcus agalactiae NOT DETECTED NOT DETECTED Final   Streptococcus  pneumoniae NOT DETECTED NOT DETECTED Final   Streptococcus pyogenes NOT DETECTED NOT DETECTED Final   A.calcoaceticus-baumannii NOT DETECTED NOT DETECTED Final   Bacteroides fragilis NOT DETECTED NOT DETECTED Final   Enterobacterales DETECTED (A) NOT DETECTED Final    Comment: Enterobacterales represent a large order of gram negative bacteria, not a single organism. CRITICAL RESULT CALLED TO, READ BACK BY AND VERIFIED WITH: PHARMD J LEDFORD 10/05/2023 @ 0230 BY AB    Enterobacter cloacae complex NOT DETECTED NOT DETECTED Final   Escherichia coli DETECTED (A) NOT DETECTED Final    Comment: CRITICAL  RESULT CALLED TO, READ BACK BY AND VERIFIED WITH: PHARMD J LEDFORD 10/05/2023 @ 0230 BY AB    Klebsiella aerogenes NOT DETECTED NOT DETECTED Final   Klebsiella oxytoca NOT DETECTED NOT DETECTED Final   Klebsiella pneumoniae NOT DETECTED NOT DETECTED Final   Proteus species NOT DETECTED NOT DETECTED Final   Salmonella species NOT DETECTED NOT DETECTED Final   Serratia marcescens NOT DETECTED NOT DETECTED Final   Haemophilus influenzae NOT DETECTED NOT DETECTED Final   Neisseria meningitidis NOT DETECTED NOT DETECTED Final   Pseudomonas aeruginosa NOT DETECTED NOT DETECTED Final   Stenotrophomonas maltophilia NOT DETECTED NOT DETECTED Final   Candida albicans NOT DETECTED NOT DETECTED Final   Candida auris NOT DETECTED NOT DETECTED Final   Candida glabrata NOT DETECTED NOT DETECTED Final   Candida krusei NOT DETECTED NOT DETECTED Final   Candida parapsilosis NOT DETECTED NOT DETECTED Final   Candida tropicalis NOT DETECTED NOT DETECTED Final   Cryptococcus neoformans/gattii NOT DETECTED NOT DETECTED Final   CTX-M ESBL NOT DETECTED NOT DETECTED Final   Carbapenem resistance IMP NOT DETECTED NOT DETECTED Final   Carbapenem resistance KPC NOT DETECTED NOT DETECTED Final   Carbapenem resistance NDM NOT DETECTED NOT DETECTED Final   Carbapenem resist OXA 48 LIKE NOT DETECTED NOT DETECTED Final    Carbapenem resistance VIM NOT DETECTED NOT DETECTED Final    Comment: Performed at [facility], [address], [place], [place] [address]  Blood culture (routine x 2)     Status: Abnormal   Collection Time: 10/04/23 10:30 AM   Specimen: BLOOD  Result Value Ref Range Status   Specimen Description BLOOD RIGHT ANTECUBITAL  Final   Special Requests   Final    BOTTLES DRAWN AEROBIC AND ANAEROBIC Blood Culture results may not be optimal due to an inadequate volume of blood received in culture bottles   Culture  Setup Time   Final    GRAM NEGATIVE RODS IN BOTH AEROBIC AND ANAEROBIC BOTTLES CRITICAL VALUE NOTED.  VALUE IS CONSISTENT WITH PREVIOUSLY REPORTED AND CALLED VALUE.    Culture (A)  Final    ESCHERICHIA COLI SUSCEPTIBILITIES PERFORMED ON PREVIOUS CULTURE WITHIN THE LAST 5 DAYS. Performed at [facility], [address], [place], [place] [address]    Report Status 10/07/2023 FINAL  Final  Aerobic/Anaerobic Culture w Gram Stain (surgical/deep wound)     Status: None (Preliminary result)   Collection Time: 10/04/23  4:38 PM   Specimen: Path fluid; Body Fluid  Result Value Ref Range Status   Specimen Description FLUID  Final   Special Requests RIGHT RENAL PELVIS  Final   Gram Stain   Final    RARE WBC PRESENT, PREDOMINANTLY PMN FEW GRAM POSITIVE COCCI FEW GRAM NEGATIVE RODS Performed at [facility], [address], [place], [place] [address]    Culture   Final    RARE ESCHERICHIA COLI CULTURE REINCUBATED FOR BETTER GROWTH NO ANAEROBES ISOLATED; CULTURE IN PROGRESS FOR 5 DAYS    Report Status PENDING  Incomplete   Organism ID, Bacteria ESCHERICHIA COLI  Final      Susceptibility   Escherichia coli - MIC*    AMPICILLIN 4 SENSITIVE Sensitive     CEFAZOLIN  (NON-URINE) <=1 SENSITIVE Sensitive     CEFEPIME  <=0.12 SENSITIVE Sensitive     ERTAPENEM <=0.12 SENSITIVE Sensitive     CEFTRIAXONE  <=0.25 SENSITIVE Sensitive     CIPROFLOXACIN  <=0.06 SENSITIVE Sensitive      GENTAMICIN 8 INTERMEDIATE  Intermediate     MEROPENEM <=0.25 SENSITIVE Sensitive     TRIMETH/SULFA <=20 SENSITIVE Sensitive     AMPICILLIN/SULBACTAM <=2 SENSITIVE Sensitive     PIP/TAZO Value in next row Sensitive ug/mL     <=4 SENSITIVEThis is a modified FDA-approved test that has been validated and its performance characteristics determined by the reporting laboratory.  This laboratory is certified under the Clinical Laboratory Improvement Amendments CLIA as qualified to perform high complexity clinical laboratory testing.    * RARE ESCHERICHIA COLI     Cathlyn July, NP [facility]  10/07/2023  2:58 PM

## 2023-10-07 NOTE — Progress Notes (Signed)
 PROGRESS NOTE    [name]  FMW:[id number] DOB: 02/14/1960 DOA: 10/04/2023 PCP: Norleen Lynwood ORN, MD   Brief Narrative:  This 63 years old female with PMH significant for asthma, diverticulosis, GERD, hyperlipidemia, hypertension presented to the ED with complaints of right-sided flank pain associated with urinary retention.  Patient also reports dysuria, fever and chills.  CT imaging showed 8 mm stone or 2 adjacent stones in the distal right ureter with right-sided hydronephrosis.  Patient admitted for sepsis secondary to UTI in the setting of ureteral stone.  She is also found to have AKI, lactic acidosis, leukocytosis.  Urology is consulted.  Patient is status post cystoscopy with ureteral stent placement tolerated well.   Assessment & Plan:   Principal Problem:   Sepsis secondary to UTI (HCC)  Sepsis secondary to UTI in the setting of ureteral stone: Ureteral obstruction with hydronephrosis: Patient presented with right-sided flank pain associated with fever , dysuria , chills and urinary retention.   She is found to have sepsis.  Continued on empiric IV antibiotics per sepsis protocol. Lactic acid normalized with IV hydration. Initiated on empiric antibiotics ( zosyn  for intraabdominal infection). Blood culture shows Enterobacter, sensitivity pending Urology consulted.   status post cystoscopy with ureteral stent placement.  Tolerated well. Continue to monitor renal functions.  Continue stents and needs 14 days of antibiotics culture sensitive. Started on Oxybutynin  and Robaxin  for urinary discomfort.  Remove Foley catheter and do voiding trial. Patient was able to void but reports dark-colored urine.  Abnormal LFTs: RUQ ultrasound shows cholelithiasis without acute cholecystitis. Continue to trend LFTs.  Alcohol use: Patient does not show any signs of alcohol withdrawal.  Morbid obesity: Diet and exercise discussed in detail. Patient will need sleep apnea workup as an  outpatient.  Atrial flutter: Currently remains in sinus rhythm. HR controlled. Continue metoprolol .  AKI on CKD stage IIIa : > Improving Her baseline creatinine remains around 1.09 > creatinine on admission 2.2. Avoid nephrotoxic medications, continue gentle hydration. Continue to monitor serum creatinine  History of PE: Resume Xarelto .  DVT prophylaxis: Xarelto  Code Status: Full code Family Communication: No family at bedside. Disposition Plan: Status is: Inpatient Remains inpatient appropriate because: Severity of illness.  Awaiting negative blood cultures to decide antibiotics at discharge.    Consultants:  Infectious Diseases. Urology  Procedures: Cystoscopy with ureteral stent placement Antimicrobials:  Anti-infectives (From admission, onward)    Start     Dose/Rate Route Frequency Ordered Stop   10/07/23 1200  amoxicillin -clavulanate (AUGMENTIN ) 875-125 MG per tablet 1 tablet        1 tablet Oral Every 12 hours 10/07/23 1018     10/05/23 1700  vancomycin  (VANCOREADY) IVPB 1250 mg/250 mL  Status:  Discontinued        1,250 mg 166.7 mL/hr over 90 Minutes Intravenous Every 24 hours 10/04/23 1810 10/05/23 0322   10/05/23 1400  piperacillin -tazobactam (ZOSYN ) IVPB 3.375 g  Status:  Discontinued        3.375 g 12.5 mL/hr over 240 Minutes Intravenous Every 8 hours 10/05/23 1050 10/07/23 1018   10/05/23 1000  cefTRIAXone  (ROCEPHIN ) 2 g in sodium chloride  0.9 % 100 mL IVPB  Status:  Discontinued        2 g 200 mL/hr over 30 Minutes Intravenous Every 24 hours 10/05/23 0322 10/05/23 1050   10/04/23 2300  ceFEPIme  (MAXIPIME ) 2 g in sodium chloride  0.9 % 100 mL IVPB  Status:  Discontinued        2 g  200 mL/hr over 30 Minutes Intravenous Every 12 hours 10/04/23 1810 10/05/23 0322   10/04/23 1630  vancomycin  (VANCOREADY) IVPB 1500 mg/300 mL        1,500 mg 150 mL/hr over 120 Minutes Intravenous  Once 10/04/23 1616 10/04/23 1828   10/04/23 1045  ceFEPIme  (MAXIPIME ) 2 g in  sodium chloride  0.9 % 100 mL IVPB        2 g 200 mL/hr over 30 Minutes Intravenous  Once 10/04/23 1031 10/04/23 1133   10/04/23 1045  metroNIDAZOLE  (FLAGYL ) IVPB 500 mg        500 mg 100 mL/hr over 60 Minutes Intravenous  Once 10/04/23 1031 10/04/23 1434       Subjective: Patient was seen and examined at bedside. Overnight events noted. Patient is status post right ureteral stent placement. Patient still reports having some pressure in the lower pelvis.  Objective: Vitals:   10/06/23 1952 10/07/23 0053 10/07/23 0500 10/07/23 0811  BP: 131/82 124/89 129/85 133/82  Pulse: 82 84 70 87  Resp: 17 18 18 18   Temp: 98.3 F (36.8 C) 97.6 F (36.4 C) 97.7 F (36.5 C) 97.8 F (36.6 C)  TempSrc: Oral Oral Oral Oral  SpO2: 95% 96% 95% 95%  Weight:      Height:        Intake/Output Summary (Last 24 hours) at 10/07/2023 1202 Last data filed at 10/07/2023 0810 Gross per 24 hour  Intake 746.77 ml  Output 450 ml  Net 296.77 ml   Filed Weights   10/04/23 1502  Weight: 133.4 kg    Examination:  General exam: Appears calm and comfortable, not in any acute distress. Respiratory system: CTA Bilaterally. Respiratory effort normal.  RR 15 Cardiovascular system: S1 & S2 heard, RRR. No JVD, murmurs, rubs, gallops or clicks. No pedal edema. Gastrointestinal system: Abdomen is non distended, soft and mildly tender. Normal bowel sounds heard. Central nervous system: Alert and oriented x 3. No focal neurological deficits. Extremities: No edema, no cyanosis, no clubbing Skin: No rashes, lesions or ulcers Psychiatry: Judgement and insight appear normal. Mood & affect appropriate.   Data Reviewed: I have personally reviewed following labs and imaging studies  CBC: Recent Labs  Lab 10/04/23 0949 10/06/23 0631 10/07/23 0415  WBC 24.5* 14.0* 10.8*  HGB 13.2 12.6 12.8  HCT 42.4 39.4 40.3  MCV 97.7 93.8 95.7  PLT 188 196 196   Basic Metabolic Panel: Recent Labs  Lab 10/04/23 0949  10/06/23 0631 10/07/23 0415  NA 139 135 137  K 3.9 3.8 4.4  CL 104 102 105  CO2 21* 22 28  GLUCOSE 162* 138* 113*  BUN 28* 41* 32*  CREATININE 2.02* 1.70* 1.27*  CALCIUM  8.9 8.8* 9.1  MG  --  1.6* 1.9  PHOS  --  2.9 3.6   GFR: Estimated Creatinine Clearance: 60.5 mL/min (A) (by C-G formula based on SCr of 1.27 mg/dL (H)). Liver Function Tests: Recent Labs  Lab 10/04/23 0949 10/06/23 0631  AST 125* 46*  ALT 90* 62*  ALKPHOS 115 88  BILITOT 0.8 0.6  PROT 7.2 6.9  ALBUMIN 3.0* 2.8*   No results for input(s): LIPASE, AMYLASE in the last 168 hours. No results for input(s): AMMONIA in the last 168 hours. Coagulation Profile: No results for input(s): INR, PROTIME in the last 168 hours. Cardiac Enzymes: No results for input(s): CKTOTAL, CKMB, CKMBINDEX, TROPONINI in the last 168 hours. BNP (last 3 results) No results for input(s): PROBNP in the last 8760 hours. HbA1C:  Recent Labs    10/04/23 1912  HGBA1C 6.2*   CBG: Recent Labs  Lab 10/04/23 0953  GLUCAP 164*   Lipid Profile: No results for input(s): CHOL, HDL, LDLCALC, TRIG, CHOLHDL, LDLDIRECT in the last 72 hours. Thyroid  Function Tests: No results for input(s): TSH, T4TOTAL, FREET4, T3FREE, THYROIDAB in the last 72 hours. Anemia Panel: No results for input(s): VITAMINB12, FOLATE, FERRITIN, TIBC, IRON, RETICCTPCT in the last 72 hours. Sepsis Labs: Recent Labs  Lab 10/04/23 1124 10/06/23 0852  LATICACIDVEN 3.7* 1.7    Recent Results (from the past 240 hours)  Blood culture (routine x 2)     Status: Abnormal   Collection Time: 10/04/23 10:25 AM   Specimen: BLOOD RIGHT HAND  Result Value Ref Range Status   Specimen Description BLOOD RIGHT HAND  Final   Special Requests   Final    BOTTLES DRAWN AEROBIC AND ANAEROBIC Blood Culture results may not be optimal due to an inadequate volume of blood received in culture bottles   Culture  Setup Time   Final     GRAM NEGATIVE RODS IN BOTH AEROBIC AND ANAEROBIC BOTTLES Organism ID to follow Performed at [facility], [address] [place], [place] [address]    Culture ESCHERICHIA COLI (A)  Final   Report Status 10/07/2023 FINAL  Final   Organism ID, Bacteria ESCHERICHIA COLI  Final      Susceptibility   Escherichia coli - MIC*    AMPICILLIN 4 SENSITIVE Sensitive     CEFAZOLIN  (NON-URINE) 2 SENSITIVE Sensitive     CEFEPIME  <=0.12 SENSITIVE Sensitive     ERTAPENEM <=0.12 SENSITIVE Sensitive     CEFTRIAXONE  <=0.25 SENSITIVE Sensitive     CIPROFLOXACIN  >=4 RESISTANT Resistant     GENTAMICIN <=1 SENSITIVE Sensitive     MEROPENEM <=0.25 SENSITIVE Sensitive     TRIMETH/SULFA <=20 SENSITIVE Sensitive     AMPICILLIN/SULBACTAM <=2 SENSITIVE Sensitive     PIP/TAZO Value in next row Sensitive ug/mL     <=4 SENSITIVEThis is a modified FDA-approved test that has been validated and its performance characteristics determined by the reporting laboratory.  This laboratory is certified under the Clinical Laboratory Improvement Amendments CLIA as qualified to perform high complexity clinical laboratory testing.    * ESCHERICHIA COLI  Blood Culture ID Panel (Reflexed)     Status: Abnormal   Collection Time: 10/04/23 10:25 AM  Result Value Ref Range Status   Enterococcus faecalis NOT DETECTED NOT DETECTED Final   Enterococcus Faecium NOT DETECTED NOT DETECTED Final   Listeria monocytogenes NOT DETECTED NOT DETECTED Final   Staphylococcus species NOT DETECTED NOT DETECTED Final   Staphylococcus aureus (BCID) NOT DETECTED NOT DETECTED Final   Staphylococcus epidermidis NOT DETECTED NOT DETECTED Final   Staphylococcus lugdunensis NOT DETECTED NOT DETECTED Final   Streptococcus species NOT DETECTED NOT DETECTED Final   Streptococcus agalactiae NOT DETECTED NOT DETECTED Final   Streptococcus pneumoniae NOT DETECTED NOT DETECTED Final   Streptococcus pyogenes NOT DETECTED NOT DETECTED Final    A.calcoaceticus-baumannii NOT DETECTED NOT DETECTED Final   Bacteroides fragilis NOT DETECTED NOT DETECTED Final   Enterobacterales DETECTED (A) NOT DETECTED Final    Comment: Enterobacterales represent a large order of gram negative bacteria, not a single organism. CRITICAL RESULT CALLED TO, READ BACK BY AND VERIFIED WITH: PHARMD J LEDFORD 10/05/2023 @ 0230 BY AB    Enterobacter cloacae complex NOT DETECTED NOT DETECTED Final   Escherichia coli DETECTED (A) NOT DETECTED Final  Comment: CRITICAL RESULT CALLED TO, READ BACK BY AND VERIFIED WITH: PHARMD J LEDFORD 10/05/2023 @ 0230 BY AB    Klebsiella aerogenes NOT DETECTED NOT DETECTED Final   Klebsiella oxytoca NOT DETECTED NOT DETECTED Final   Klebsiella pneumoniae NOT DETECTED NOT DETECTED Final   Proteus species NOT DETECTED NOT DETECTED Final   Salmonella species NOT DETECTED NOT DETECTED Final   Serratia marcescens NOT DETECTED NOT DETECTED Final   Haemophilus influenzae NOT DETECTED NOT DETECTED Final   Neisseria meningitidis NOT DETECTED NOT DETECTED Final   Pseudomonas aeruginosa NOT DETECTED NOT DETECTED Final   Stenotrophomonas maltophilia NOT DETECTED NOT DETECTED Final   Candida albicans NOT DETECTED NOT DETECTED Final   Candida auris NOT DETECTED NOT DETECTED Final   Candida glabrata NOT DETECTED NOT DETECTED Final   Candida krusei NOT DETECTED NOT DETECTED Final   Candida parapsilosis NOT DETECTED NOT DETECTED Final   Candida tropicalis NOT DETECTED NOT DETECTED Final   Cryptococcus neoformans/gattii NOT DETECTED NOT DETECTED Final   CTX-M ESBL NOT DETECTED NOT DETECTED Final   Carbapenem resistance IMP NOT DETECTED NOT DETECTED Final   Carbapenem resistance KPC NOT DETECTED NOT DETECTED Final   Carbapenem resistance NDM NOT DETECTED NOT DETECTED Final   Carbapenem resist OXA 48 LIKE NOT DETECTED NOT DETECTED Final   Carbapenem resistance VIM NOT DETECTED NOT DETECTED Final    Comment: Performed at [facility]  Lab, [address] [place], [place] [address]  Blood culture (routine x 2)     Status: Abnormal   Collection Time: 10/04/23 10:30 AM   Specimen: BLOOD  Result Value Ref Range Status   Specimen Description BLOOD RIGHT ANTECUBITAL  Final   Special Requests   Final    BOTTLES DRAWN AEROBIC AND ANAEROBIC Blood Culture results may not be optimal due to an inadequate volume of blood received in culture bottles   Culture  Setup Time   Final    GRAM NEGATIVE RODS IN BOTH AEROBIC AND ANAEROBIC BOTTLES CRITICAL VALUE NOTED.  VALUE IS CONSISTENT WITH PREVIOUSLY REPORTED AND CALLED VALUE.    Culture (A)  Final    ESCHERICHIA COLI SUSCEPTIBILITIES PERFORMED ON PREVIOUS CULTURE WITHIN THE LAST 5 DAYS. Performed at [facility], [address] [place], [place] [address]    Report Status 10/07/2023 FINAL  Final  Aerobic/Anaerobic Culture w Gram Stain (surgical/deep wound)     Status: None (Preliminary result)   Collection Time: 10/04/23  4:38 PM   Specimen: Path fluid; Body Fluid  Result Value Ref Range Status   Specimen Description FLUID  Final   Special Requests RIGHT RENAL PELVIS  Final   Gram Stain   Final    RARE WBC PRESENT, PREDOMINANTLY PMN FEW GRAM POSITIVE COCCI FEW GRAM NEGATIVE RODS Performed at [facility], [address], [place], [place] [address]    Culture   Final    RARE ESCHERICHIA COLI CULTURE REINCUBATED FOR BETTER GROWTH NO ANAEROBES ISOLATED; CULTURE IN PROGRESS FOR 5 DAYS    Report Status PENDING  Incomplete   Organism ID, Bacteria ESCHERICHIA COLI  Final      Susceptibility   Escherichia coli - MIC*    AMPICILLIN 4 SENSITIVE Sensitive     CEFAZOLIN  (NON-URINE) <=1 SENSITIVE Sensitive     CEFEPIME  <=0.12 SENSITIVE Sensitive     ERTAPENEM <=0.12 SENSITIVE Sensitive     CEFTRIAXONE  <=0.25 SENSITIVE Sensitive     CIPROFLOXACIN  <=0.06 SENSITIVE Sensitive     GENTAMICIN 8  INTERMEDIATE Intermediate     MEROPENEM <=0.25 SENSITIVE Sensitive     TRIMETH/SULFA  <=20 SENSITIVE Sensitive     AMPICILLIN/SULBACTAM <=2 SENSITIVE Sensitive     PIP/TAZO Value in next row Sensitive ug/mL     <=4 SENSITIVEThis is a modified FDA-approved test that has been validated and its performance characteristics determined by the reporting laboratory.  This laboratory is certified under the Clinical Laboratory Improvement Amendments CLIA as qualified to perform high complexity clinical laboratory testing.    * RARE ESCHERICHIA COLI    Radiology Studies: No results found.  Scheduled Meds:  amoxicillin -clavulanate  1 tablet Oral Q12H   atorvastatin   20 mg Oral Daily   Chlorhexidine  Gluconate Cloth  6 each Topical Daily   famotidine   20 mg Oral Q0600   fluticasone  furoate-vilanterol  1 puff Inhalation Daily   metoprolol  succinate  25 mg Oral Daily   oxybutynin   5 mg Oral TID   rivaroxaban   20 mg Oral Q supper   Continuous Infusions:     LOS: 3 days    Time spent: 35 mins    Darcel Dawley, MD [facility]   If 7PM-7AM, please contact night-coverage

## 2023-10-07 NOTE — TOC Progression Note (Signed)
 Transition of Care [facility]) - Progression Note    Patient Details  Name: [name] MRN: [id number] Date of Birth: 15-Jun-1960  [facility] of [facility]) CM/SW Contact  Graves-Bigelow, Erminio Deems, RN Phone Number: 10/07/2023, 3:10 PM  Clinical Narrative:  Case Manager was able to obtain insurance card from the patient. Information submitted to Admitting. Case Manager will continue to follow for additional transition of care needs.    Expected Discharge Plan: Home/Self Care Barriers to Discharge: Continued Medical Work up  Expected Discharge Plan and Services In-house Referral: NA Discharge Planning Services: CM Consult Post Acute Care Choice: NA Living arrangements for the past 2 months: Single Family Home                   DME Agency: NA    Social Drivers of Health (SDOH) Interventions SDOH Screenings   Food Insecurity: Food Insecurity Present (10/04/2023)  Housing: Unknown (10/04/2023)  Transportation Needs: No Transportation Needs (10/04/2023)  Utilities: Not At Risk (10/04/2023)  Depression (PHQ2-9): Low Risk  (08/06/2023)  Financial Resource Strain: Medium Risk (11/07/2021)  Tobacco Use: Medium Risk (10/04/2023)    Readmission Risk Interventions    10/06/2023    3:11 PM  Readmission Risk Prevention Plan  Transportation Screening Complete  PCP or Specialist Appt within 3-5 Days Complete  HRI or Home Care Consult Complete  Social Work Consult for Recovery Care Planning/Counseling Complete  Palliative Care Screening Not Applicable  Medication Review Oceanographer) Referral to Pharmacy

## 2023-10-07 NOTE — Discharge Instructions (Signed)
 Food Resources Agency Name: [employer] Address: [address] [place], [place] [address] Phone: [phone] Website: LocalChronicle.com.cy. Service(s) Offered: Provides grocery assistance to income eligible  individuals and their families. Hours are  Monday - Friday 10am-2pm by appointment only Hours may vary. Agency Name: [facility] Address: [address] [place]. [place], [place] [address] Phone: [phone] Website: [email or web address].[email or web address]/ Service(s) Offered: food assistance hours are from 9:30am to 4:30pm Call [phone] or [phone] for arrangements. April 24, 2021 12 Agency Name: [facility] Address: [phone] [place], [place] Phone: [phone] Website: [email or web address] Service(s) Offered: Food assistance hours are Tues & Thurs 9am12pm. Please bring your driver's license and  social security cards for all members of your  household. Agency Name: [employer] [facility] Listing: Additional Resources: https:www.foodpantries.org/ci/[place]

## 2023-10-08 ENCOUNTER — Other Ambulatory Visit (HOSPITAL_COMMUNITY): Payer: Self-pay

## 2023-10-08 DIAGNOSIS — A419 Sepsis, unspecified organism: Secondary | ICD-10-CM | POA: Diagnosis not present

## 2023-10-08 DIAGNOSIS — N390 Urinary tract infection, site not specified: Secondary | ICD-10-CM | POA: Diagnosis not present

## 2023-10-08 LAB — BASIC METABOLIC PANEL WITH GFR
Anion gap: 9 (ref 5–15)
BUN: 28 mg/dL — ABNORMAL HIGH (ref 8–23)
CO2: 26 mmol/L (ref 22–32)
Calcium: 9.3 mg/dL (ref 8.9–10.3)
Chloride: 103 mmol/L (ref 98–111)
Creatinine, Ser: 1.19 mg/dL — ABNORMAL HIGH (ref 0.44–1.00)
GFR, Estimated: 52 mL/min — ABNORMAL LOW (ref >60)
Glucose, Bld: 122 mg/dL — ABNORMAL HIGH (ref 70–99)
Potassium: 4.4 mmol/L (ref 3.5–5.1)
Sodium: 138 mmol/L (ref 135–145)

## 2023-10-08 LAB — CBC
HCT: 40.1 % (ref 36.0–46.0)
Hemoglobin: 12.6 g/dL (ref 12.0–15.0)
MCH: 29.9 pg (ref 26.0–34.0)
MCHC: 31.4 g/dL (ref 30.0–36.0)
MCV: 95.2 fL (ref 80.0–100.0)
Platelets: 235 K/uL (ref 150–400)
RBC: 4.21 MIL/uL (ref 3.87–5.11)
RDW: 14 % (ref 11.5–15.5)
WBC: 12.2 K/uL — ABNORMAL HIGH (ref 4.0–10.5)
nRBC: 0 % (ref 0.0–0.2)

## 2023-10-08 MED ORDER — OXYCODONE HCL 5 MG PO TABS
5.0000 mg | ORAL_TABLET | ORAL | 0 refills | Status: AC | PRN
  Filled 2023-10-08: qty 9, 3d supply, fill #0

## 2023-10-08 MED ORDER — MUSCLE RUB 10-15 % EX CREA
TOPICAL_CREAM | CUTANEOUS | Status: DC | PRN
  Filled 2023-10-08: qty 85

## 2023-10-08 MED ORDER — OXYBUTYNIN CHLORIDE 5 MG PO TABS
5.0000 mg | ORAL_TABLET | Freq: Three times a day (TID) | ORAL | 0 refills | Status: AC
  Filled 2023-10-08: qty 90, 30d supply, fill #0

## 2023-10-08 NOTE — Plan of Care (Signed)

## 2023-10-08 NOTE — Discharge Summary (Signed)
 Physician Discharge Summary  [name] FMW:[id number] DOB: 10-18-1960 DOA: 10/04/2023  PCP: Norleen Lynwood ORN, MD  Admit date: 10/04/2023  Discharge date: 10/08/2023  Admitted From: Home  Disposition:  Home  Recommendations for Outpatient Follow-up:  Follow up with PCP in 1-2 weeks. Please obtain BMP/CBC in one week. Advised to take Augmentin  twice a day for 10 more days to complete 2 weeks treatment for ESBL bacteremia. Advised to follow-up with Urology for definitive stone treatment.  Home Health: None Equipment/Devices:None  Discharge Condition: Stable CODE STATUS:Full code Diet recommendation: Heart Healthy   Brief [facility] Course: This 63 years old female with PMH significant for asthma, diverticulosis, GERD, hyperlipidemia, hypertension presented to the ED with complaints of right-sided flank pain associated with urinary retention. Patient also reports dysuria, fever and chills. CT imaging showed 8 mm stone or 2 adjacent stones in the distal right ureter with right-sided hydronephrosis. Patient admitted for sepsis secondary to UTI in the setting of ureteral stone. She is also found to have AKI, lactic acidosis, leukocytosis. Urology is consulted. Patient is status post cystoscopy with ureteral stent placement , tolerated well.  Urine and blood cultures grew E. coli sensitive to amoxicillin /clavulanate.  Patient feels much better, leukocytosis, lactic acidosis, and AKI resolved.  Patient feels better,  ID signed off/ Patient being discharged on Augmentin  for 10 more days to complete 2 weeks treatment for E. coli bacteremia.  Patient will follow-up with urology for definitive stone treatment.  Patient being discharged home.   Discharge Diagnoses:  Principal Problem:   Sepsis secondary to UTI [facility]) Active Problems:   Bacteremia  Sepsis secondary to UTI in the setting of ureteral stone: Ureteral obstruction with hydronephrosis: Patient presented with right-sided flank  pain associated with fever , dysuria , chills and urinary retention.   She is found to have sepsis.  Continued on empiric IV antibiotics per sepsis protocol. Lactic acid normalized with IV hydration. Initiated on empiric antibiotics ( zosyn  for intraabdominal infection). Blood culture shows Enterobacter, sensitivity pending Urology consulted.   status post cystoscopy with ureteral stent placement.  Tolerated well. Continue to monitor renal functions.  Continue stents and needs 14 days of antibiotics culture sensitive. Started on Oxybutynin  and Robaxin  for urinary discomfort.  Patient was able to void but reports dark-colored urine. ID recommended patient can be discharged on Augmentin  for 10 more days to complete 2-week treatment. Sepsis resolved.  Hydronephrosis improved.   Abnormal LFTs: RUQ ultrasound shows cholelithiasis without acute cholecystitis. Continue to trend LFTs.   Alcohol use: Patient does not show any signs of alcohol withdrawal.   Morbid obesity: Diet and exercise discussed in detail. Patient will need sleep apnea workup as an outpatient.   Atrial flutter: Currently remains in sinus rhythm. HR controlled. Continue metoprolol .   AKI on CKD stage IIIa : > Improving Her baseline creatinine remains around 1.09 > creatinine on admission 2.2. Avoid nephrotoxic medications, continue gentle hydration. Continue to monitor serum creatinine   History of PE: Resume Xarelto .  Discharge Instructions  Discharge Instructions     Call MD for:  difficulty breathing, headache or visual disturbances   Complete by: As directed    Call MD for:  persistant dizziness or light-headedness   Complete by: As directed    Call MD for:  persistant nausea and vomiting   Complete by: As directed    Diet - low sodium heart healthy   Complete by: As directed    Diet general   Complete by: As directed  Discharge instructions   Complete by: As directed    Advised to follow-up with  primary care physician in 1 week. Advised to take Augmentin  twice a day for 10 more days to complete 2 weeks treatment for ESBL bacteremia. Advised to follow-up with urology for definitive stone treatment.   Increase activity slowly   Complete by: As directed       Allergies as of 10/08/2023       Reactions   Morphine  Itching   Shrimp [shellfish Allergy] Itching, Other (See Comments)   Tongue burns also   Tramadol  Other (See Comments)   Caused confusion   Covid-19 (mrna) Vaccine Hives   Diltiazem  Hcl Itching   Pt with itching of the feet when bolus given   Latex Rash        Medication List     TAKE these medications    albuterol  108 (90 Base) MCG/ACT inhaler Commonly known as: VENTOLIN  HFA INHALE 2 PUFFS INTO THE LUNGS EVERY 6 HOURS AS NEEDED   amoxicillin -clavulanate 875-125 MG tablet Commonly known as: AUGMENTIN  Take 1 tablet by mouth every 8 (eight) hours for 10 days.   atorvastatin  20 MG tablet Commonly known as: LIPITOR Take 1 tablet (20 mg total) by mouth daily.   calcium  carbonate 500 MG chewable tablet Commonly known as: TUMS - dosed in mg elemental calcium  Chew 1 tablet by mouth daily as needed for indigestion or heartburn.   famotidine  20 MG tablet Commonly known as: PEPCID  Take 1 tablet (20 mg total) by mouth 2 (two) times daily.   fluticasone  furoate-vilanterol 200-25 MCG/ACT Aepb Commonly known as: BREO ELLIPTA  Inhale 1 puff into the lungs daily.   LORazepam  0.5 MG tablet Commonly known as: ATIVAN  Take 1 tablet (0.5 mg total) by mouth 2 (two) times daily as needed for anxiety.   losartan -hydrochlorothiazide  100-25 MG tablet Commonly known as: HYZAAR Take 1 tablet by mouth daily.   metoprolol  succinate 25 MG 24 hr tablet Commonly known as: TOPROL -XL Take 1 tablet (25 mg total) by mouth daily.   ondansetron  4 MG disintegrating tablet Commonly known as: ZOFRAN -ODT TAKE 1 TABLET BY MOUTH EVERY 8 HOURS AS NEEDED FOR NAUSEA AND VOMITING    oxybutynin  5 MG tablet Commonly known as: DITROPAN  Take 1 tablet (5 mg total) by mouth 3 (three) times daily.   oxyCODONE  5 MG immediate release tablet Commonly known as: Oxy IR/ROXICODONE  Take 1 tablet (5 mg total) by mouth every 4 (four) hours as needed for up to 3 days for breakthrough pain.   potassium chloride  10 MEQ tablet Commonly known as: Klor-Con  10 Take 1 tablet (10 mEq total) by mouth daily.   rivaroxaban  20 MG Tabs tablet Commonly known as: XARELTO  Take 1 tablet (20 mg total) by mouth daily with supper.   triamcinolone  cream 0.1 % Commonly known as: KENALOG  APPLY 1 APPLICATION TOPICALLY 2 (TWO) TIMES DAILY AS NEEDED (ITCHING).        Follow-up Information     Norleen Lynwood ORN, MD Follow up in 1 week(s).   Specialties: Internal Medicine, Radiology Contact information: [address] [place] [address] [phone]         [facility] Follow up in 1 week(s).   Contact information: [address] [place] [address] [place]  [address] [phone]               Allergies  Allergen Reactions   Morphine  Itching   Shrimp [Shellfish Allergy] Itching and Other (See Comments)  Tongue burns also   Tramadol  Other (See Comments)    Caused confusion   Covid-19 (Mrna) Vaccine Hives   Diltiazem  Hcl Itching    Pt with itching of the feet when bolus given   Latex Rash    Consultations: Urology Infectious disease   Procedures/Studies: US  Abdomen Limited RUQ (LIVER/GB) Result Date: 10/05/2023 CLINICAL DATA:  [id number] Abnormal LFTs 402470 EXAM: ULTRASOUND ABDOMEN LIMITED RIGHT UPPER QUADRANT COMPARISON:  CT renal 10/04/2023 FINDINGS: Gallbladder: Calcified stones within the gallbladder lumen. No gallbladder wall thickening or pericholecystic fluid visualized. No sonographic Murphy sign noted by sonographer. Common bile duct: Diameter: 4 mm Liver: No focal lesion identified. Increased parenchymal echogenicity. Portal vein is patent on  color Doppler imaging with normal direction of blood flow towards the liver. Other: None. IMPRESSION: 1. Cholelithiasis with no acute cholecystitis. 2. Hepatic steatosis. Please note limited evaluation for focal hepatic masses in a patient with hepatic steatosis due to decreased penetration of the acoustic ultrasound waves. Electronically Signed   By: Morgane  Naveau M.D.   On: 10/05/2023 00:53   DG C-Arm 1-60 Min-No Report Result Date: 10/04/2023 Fluoroscopy was utilized by the requesting physician.  No radiographic interpretation.   CT Renal Stone Study Result Date: 10/04/2023 CLINICAL DATA:  Abdominal pain.  Concern for kidney stone. EXAM: CT ABDOMEN AND PELVIS WITHOUT CONTRAST TECHNIQUE: Multidetector CT imaging of the abdomen and pelvis was performed following the standard protocol without IV contrast. RADIATION DOSE REDUCTION: This exam was performed according to the departmental dose-optimization program which includes automated exposure control, adjustment of the mA and/or kV according to patient size and/or use of iterative reconstruction technique. COMPARISON:  CT dated 08/14/2022. FINDINGS: Evaluation of this exam is limited in the absence of intravenous contrast. Lower chest: The visualized lung bases are clear. No intra-abdominal free air or free fluid. Hepatobiliary: The liver is mildly enlarged measuring 17 cm in midclavicular length. There is diffuse fatty infiltration of the liver. Several small lesions within the liver measure up to 3.5 cm in the dome of the liver are similar to prior CT and likely represent areas of fat sparing or hemangioma. No biliary dilatation. Tiny gallstones noted. No pericholecystic fluid or evidence of acute cholecystitis by CT. Pancreas: Unremarkable. No pancreatic ductal dilatation or surrounding inflammatory changes. Spleen: Normal in size without focal abnormality. Adrenals/Urinary Tract: The adrenal glands are unremarkable. Nonobstructing bilateral renal calculi  measure 1 cm in the inferior pole of the right kidney and 14 mm in the left kidney. No hydronephrosis on the left. There is an 8 mm stone or 2 adjacent stones in the distal right ureter. There is mild right hydronephrosis. The urinary bladder is collapsed. Stomach/Bowel: There is diffuse colonic diverticulosis. There is no bowel obstruction or active inflammation. The appendix is normal. Vascular/Lymphatic: Moderate aortoiliac atherosclerotic disease. The IVC is unremarkable. No portal venous gas. There is no adenopathy. Reproductive: Hysterectomy.  No suspicious adnexal masses. Other: Small fat containing umbilical hernia. Musculoskeletal: Degenerative changes of the spine. No acute osseous pathology. IMPRESSION: 1. An 8 mm stone or two adjacent stones in the distal right ureter with mild right hydronephrosis. 2. Additional nonobstructing bilateral renal calculi. 3. Colonic diverticulosis. No bowel obstruction. Normal appendix. 4. Fatty liver. 5. Cholelithiasis. 6.  Aortic Atherosclerosis (ICD10-I70.0). Electronically Signed   By: Vanetta Chou M.D.   On: 10/04/2023 13:32    Subjective: Patient was seen and examined at bedside.  Overnight events noted. Patient reports feeling much improved and wants to be discharged.  Denies  any urinary symptoms  Discharge Exam: Vitals:   10/08/23 1044 10/08/23 1045  BP: 137/81 138/81  Pulse: 67 84  Resp:    Temp: (!) 97.5 F (36.4 C)   SpO2: 95% 100%   Vitals:   10/08/23 0745 10/08/23 0748 10/08/23 1044 10/08/23 1045  BP:  (!) 147/92 137/81 138/81  Pulse: 88 77 67 84  Resp: 18 18    Temp:  98.1 F (36.7 C) (!) 97.5 F (36.4 C)   TempSrc:  Oral Oral   SpO2: 95% 91% 95% 100%  Weight:      Height:        General: Pt is alert, awake, not in acute distress Cardiovascular: RRR, S1/S2 +, no rubs, no gallops Respiratory: CTA bilaterally, no wheezing, no rhonchi Abdominal: Soft, NT, ND, bowel sounds + Extremities: no edema, no cyanosis    The  results of significant diagnostics from this hospitalization (including imaging, microbiology, ancillary and laboratory) are listed below for reference.     Microbiology: Recent Results (from the past 240 hours)  Blood culture (routine x 2)     Status: Abnormal   Collection Time: 10/04/23 10:25 AM   Specimen: BLOOD RIGHT HAND  Result Value Ref Range Status   Specimen Description BLOOD RIGHT HAND  Final   Special Requests   Final    BOTTLES DRAWN AEROBIC AND ANAEROBIC Blood Culture results may not be optimal due to an inadequate volume of blood received in culture bottles   Culture  Setup Time   Final    GRAM NEGATIVE RODS IN BOTH AEROBIC AND ANAEROBIC BOTTLES Organism ID to follow Performed at [facility], [address] [place], [place] [address]    Culture ESCHERICHIA COLI (A)  Final   Report Status 10/07/2023 FINAL  Final   Organism ID, Bacteria ESCHERICHIA COLI  Final      Susceptibility   Escherichia coli - MIC*    AMPICILLIN 4 SENSITIVE Sensitive     CEFAZOLIN  (NON-URINE) 2 SENSITIVE Sensitive     CEFEPIME  <=0.12 SENSITIVE Sensitive     ERTAPENEM <=0.12 SENSITIVE Sensitive     CEFTRIAXONE  <=0.25 SENSITIVE Sensitive     CIPROFLOXACIN  >=4 RESISTANT Resistant     GENTAMICIN <=1 SENSITIVE Sensitive     MEROPENEM <=0.25 SENSITIVE Sensitive     TRIMETH/SULFA <=20 SENSITIVE Sensitive     AMPICILLIN/SULBACTAM <=2 SENSITIVE Sensitive     PIP/TAZO Value in next row Sensitive ug/mL     <=4 SENSITIVEThis is a modified FDA-approved test that has been validated and its performance characteristics determined by the reporting laboratory.  This laboratory is certified under the Clinical Laboratory Improvement Amendments CLIA as qualified to perform high complexity clinical laboratory testing.    * ESCHERICHIA COLI  Blood Culture ID Panel (Reflexed)     Status: Abnormal   Collection Time: 10/04/23 10:25 AM  Result Value Ref Range Status   Enterococcus faecalis NOT DETECTED NOT  DETECTED Final   Enterococcus Faecium NOT DETECTED NOT DETECTED Final   Listeria monocytogenes NOT DETECTED NOT DETECTED Final   Staphylococcus species NOT DETECTED NOT DETECTED Final   Staphylococcus aureus (BCID) NOT DETECTED NOT DETECTED Final   Staphylococcus epidermidis NOT DETECTED NOT DETECTED Final   Staphylococcus lugdunensis NOT DETECTED NOT DETECTED Final   Streptococcus species NOT DETECTED NOT DETECTED Final   Streptococcus agalactiae NOT DETECTED NOT DETECTED Final   Streptococcus pneumoniae NOT DETECTED NOT DETECTED Final   Streptococcus pyogenes NOT DETECTED NOT DETECTED Final   A.calcoaceticus-baumannii  NOT DETECTED NOT DETECTED Final   Bacteroides fragilis NOT DETECTED NOT DETECTED Final   Enterobacterales DETECTED (A) NOT DETECTED Final    Comment: Enterobacterales represent a large order of gram negative bacteria, not a single organism. CRITICAL RESULT CALLED TO, READ BACK BY AND VERIFIED WITH: PHARMD J LEDFORD 10/05/2023 @ 0230 BY AB    Enterobacter cloacae complex NOT DETECTED NOT DETECTED Final   Escherichia coli DETECTED (A) NOT DETECTED Final    Comment: CRITICAL RESULT CALLED TO, READ BACK BY AND VERIFIED WITH: PHARMD J LEDFORD 10/05/2023 @ 0230 BY AB    Klebsiella aerogenes NOT DETECTED NOT DETECTED Final   Klebsiella oxytoca NOT DETECTED NOT DETECTED Final   Klebsiella pneumoniae NOT DETECTED NOT DETECTED Final   Proteus species NOT DETECTED NOT DETECTED Final   Salmonella species NOT DETECTED NOT DETECTED Final   Serratia marcescens NOT DETECTED NOT DETECTED Final   Haemophilus influenzae NOT DETECTED NOT DETECTED Final   Neisseria meningitidis NOT DETECTED NOT DETECTED Final   Pseudomonas aeruginosa NOT DETECTED NOT DETECTED Final   Stenotrophomonas maltophilia NOT DETECTED NOT DETECTED Final   Candida albicans NOT DETECTED NOT DETECTED Final   Candida auris NOT DETECTED NOT DETECTED Final   Candida glabrata NOT DETECTED NOT DETECTED Final   Candida  krusei NOT DETECTED NOT DETECTED Final   Candida parapsilosis NOT DETECTED NOT DETECTED Final   Candida tropicalis NOT DETECTED NOT DETECTED Final   Cryptococcus neoformans/gattii NOT DETECTED NOT DETECTED Final   CTX-M ESBL NOT DETECTED NOT DETECTED Final   Carbapenem resistance IMP NOT DETECTED NOT DETECTED Final   Carbapenem resistance KPC NOT DETECTED NOT DETECTED Final   Carbapenem resistance NDM NOT DETECTED NOT DETECTED Final   Carbapenem resist OXA 48 LIKE NOT DETECTED NOT DETECTED Final   Carbapenem resistance VIM NOT DETECTED NOT DETECTED Final    Comment: Performed at [facility], [address], [place], [place] [address]  Blood culture (routine x 2)     Status: Abnormal   Collection Time: 10/04/23 10:30 AM   Specimen: BLOOD  Result Value Ref Range Status   Specimen Description BLOOD RIGHT ANTECUBITAL  Final   Special Requests   Final    BOTTLES DRAWN AEROBIC AND ANAEROBIC Blood Culture results may not be optimal due to an inadequate volume of blood received in culture bottles   Culture  Setup Time   Final    GRAM NEGATIVE RODS IN BOTH AEROBIC AND ANAEROBIC BOTTLES CRITICAL VALUE NOTED.  VALUE IS CONSISTENT WITH PREVIOUSLY REPORTED AND CALLED VALUE.    Culture (A)  Final    ESCHERICHIA COLI SUSCEPTIBILITIES PERFORMED ON PREVIOUS CULTURE WITHIN THE LAST 5 DAYS. Performed at [facility], [address] [place], [place] [address]    Report Status 10/07/2023 FINAL  Final  Aerobic/Anaerobic Culture w Gram Stain (surgical/deep wound)     Status: None (Preliminary result)   Collection Time: 10/04/23  4:38 PM   Specimen: Path fluid; Body Fluid  Result Value Ref Range Status   Specimen Description FLUID  Final   Special Requests RIGHT RENAL PELVIS  Final   Gram Stain   Final    RARE WBC PRESENT, PREDOMINANTLY PMN FEW GRAM POSITIVE COCCI FEW GRAM NEGATIVE RODS Performed at [facility], [address] [place], [place] [address]    Culture   Final     RARE ESCHERICHIA COLI RARE MICROCOCCUS SPECIES MICROCOCCUS LUTEUS Standardized susceptibility testing for this organism is not available. NO ANAEROBES  ISOLATED; CULTURE IN PROGRESS FOR 5 DAYS    Report Status PENDING  Incomplete   Organism ID, Bacteria ESCHERICHIA COLI  Final      Susceptibility   Escherichia coli - MIC*    AMPICILLIN 4 SENSITIVE Sensitive     CEFAZOLIN  (NON-URINE) <=1 SENSITIVE Sensitive     CEFEPIME  <=0.12 SENSITIVE Sensitive     ERTAPENEM <=0.12 SENSITIVE Sensitive     CEFTRIAXONE  <=0.25 SENSITIVE Sensitive     CIPROFLOXACIN  <=0.06 SENSITIVE Sensitive     GENTAMICIN 8 INTERMEDIATE Intermediate     MEROPENEM <=0.25 SENSITIVE Sensitive     TRIMETH/SULFA <=20 SENSITIVE Sensitive     AMPICILLIN/SULBACTAM <=2 SENSITIVE Sensitive     PIP/TAZO Value in next row Sensitive ug/mL     <=4 SENSITIVEThis is a modified FDA-approved test that has been validated and its performance characteristics determined by the reporting laboratory.  This laboratory is certified under the Clinical Laboratory Improvement Amendments CLIA as qualified to perform high complexity clinical laboratory testing.    * RARE ESCHERICHIA COLI     Labs: BNP (last 3 results) Recent Labs    04/17/23 1257  BNP 117.2*   Basic Metabolic Panel: Recent Labs  Lab 10/04/23 0949 10/06/23 0631 10/07/23 0415 10/08/23 0514  NA 139 135 137 138  K 3.9 3.8 4.4 4.4  CL 104 102 105 103  CO2 21* 22 28 26   GLUCOSE 162* 138* 113* 122*  BUN 28* 41* 32* 28*  CREATININE 2.02* 1.70* 1.27* 1.19*  CALCIUM  8.9 8.8* 9.1 9.3  MG  --  1.6* 1.9  --   PHOS  --  2.9 3.6  --    Liver Function Tests: Recent Labs  Lab 10/04/23 0949 10/06/23 0631  AST 125* 46*  ALT 90* 62*  ALKPHOS 115 88  BILITOT 0.8 0.6  PROT 7.2 6.9  ALBUMIN 3.0* 2.8*   No results for input(s): LIPASE, AMYLASE in the last 168 hours. No results for input(s): AMMONIA in the last 168 hours. CBC: Recent Labs  Lab 10/04/23 0949  10/06/23 0631 10/07/23 0415 10/08/23 0514  WBC 24.5* 14.0* 10.8* 12.2*  HGB 13.2 12.6 12.8 12.6  HCT 42.4 39.4 40.3 40.1  MCV 97.7 93.8 95.7 95.2  PLT 188 196 196 235   Cardiac Enzymes: No results for input(s): CKTOTAL, CKMB, CKMBINDEX, TROPONINI in the last 168 hours. BNP: Invalid input(s): POCBNP CBG: Recent Labs  Lab 10/04/23 0953  GLUCAP 164*   D-Dimer No results for input(s): DDIMER in the last 72 hours. Hgb A1c No results for input(s): HGBA1C in the last 72 hours. Lipid Profile No results for input(s): CHOL, HDL, LDLCALC, TRIG, CHOLHDL, LDLDIRECT in the last 72 hours. Thyroid  function studies No results for input(s): TSH, T4TOTAL, T3FREE, THYROIDAB in the last 72 hours.  Invalid input(s): FREET3 Anemia work up No results for input(s): VITAMINB12, FOLATE, FERRITIN, TIBC, IRON, RETICCTPCT in the last 72 hours. Urinalysis    Component Value Date/Time   COLORURINE YELLOW 10/04/2023 0949   APPEARANCEUR CLOUDY (A) 10/04/2023 0949   LABSPEC 1.020 10/04/2023 0949   PHURINE 5.5 10/04/2023 0949   GLUCOSEU NEGATIVE 10/04/2023 0949   GLUCOSEU NEGATIVE 12/03/2022 1536   HGBUR MODERATE (A) 10/04/2023 0949   BILIRUBINUR NEGATIVE 10/04/2023 0949   BILIRUBINUR negative 09/16/2011 1047   KETONESUR NEGATIVE 10/04/2023 0949   PROTEINUR TRACE (A) 10/04/2023 0949   UROBILINOGEN 0.2 12/03/2022 1536   NITRITE POSITIVE (A) 10/04/2023 0949   LEUKOCYTESUR MODERATE (A) 10/04/2023 0949   Sepsis Labs Recent Labs  Lab 10/04/23 0949 10/06/23 0631 10/07/23 0415 10/08/23 0514  WBC 24.5* 14.0* 10.8* 12.2*   Microbiology Recent Results (from the past 240 hours)  Blood culture (routine x 2)     Status: Abnormal   Collection Time: 10/04/23 10:25 AM   Specimen: BLOOD RIGHT HAND  Result Value Ref Range Status   Specimen Description BLOOD RIGHT HAND  Final   Special Requests   Final    BOTTLES DRAWN AEROBIC AND ANAEROBIC Blood Culture  results may not be optimal due to an inadequate volume of blood received in culture bottles   Culture  Setup Time   Final    GRAM NEGATIVE RODS IN BOTH AEROBIC AND ANAEROBIC BOTTLES Organism ID to follow Performed at [facility], [address] [place], [place] [address]    Culture ESCHERICHIA COLI (A)  Final   Report Status 10/07/2023 FINAL  Final   Organism ID, Bacteria ESCHERICHIA COLI  Final      Susceptibility   Escherichia coli - MIC*    AMPICILLIN 4 SENSITIVE Sensitive     CEFAZOLIN  (NON-URINE) 2 SENSITIVE Sensitive     CEFEPIME  <=0.12 SENSITIVE Sensitive     ERTAPENEM <=0.12 SENSITIVE Sensitive     CEFTRIAXONE  <=0.25 SENSITIVE Sensitive     CIPROFLOXACIN  >=4 RESISTANT Resistant     GENTAMICIN <=1 SENSITIVE Sensitive     MEROPENEM <=0.25 SENSITIVE Sensitive     TRIMETH/SULFA <=20 SENSITIVE Sensitive     AMPICILLIN/SULBACTAM <=2 SENSITIVE Sensitive     PIP/TAZO Value in next row Sensitive ug/mL     <=4 SENSITIVEThis is a modified FDA-approved test that has been validated and its performance characteristics determined by the reporting laboratory.  This laboratory is certified under the Clinical Laboratory Improvement Amendments CLIA as qualified to perform high complexity clinical laboratory testing.    * ESCHERICHIA COLI  Blood Culture ID Panel (Reflexed)     Status: Abnormal   Collection Time: 10/04/23 10:25 AM  Result Value Ref Range Status   Enterococcus faecalis NOT DETECTED NOT DETECTED Final   Enterococcus Faecium NOT DETECTED NOT DETECTED Final   Listeria monocytogenes NOT DETECTED NOT DETECTED Final   Staphylococcus species NOT DETECTED NOT DETECTED Final   Staphylococcus aureus (BCID) NOT DETECTED NOT DETECTED Final   Staphylococcus epidermidis NOT DETECTED NOT DETECTED Final   Staphylococcus lugdunensis NOT DETECTED NOT DETECTED Final   Streptococcus species NOT DETECTED NOT DETECTED Final   Streptococcus agalactiae NOT DETECTED NOT DETECTED Final    Streptococcus pneumoniae NOT DETECTED NOT DETECTED Final   Streptococcus pyogenes NOT DETECTED NOT DETECTED Final   A.calcoaceticus-baumannii NOT DETECTED NOT DETECTED Final   Bacteroides fragilis NOT DETECTED NOT DETECTED Final   Enterobacterales DETECTED (A) NOT DETECTED Final    Comment: Enterobacterales represent a large order of gram negative bacteria, not a single organism. CRITICAL RESULT CALLED TO, READ BACK BY AND VERIFIED WITH: PHARMD J LEDFORD 10/05/2023 @ 0230 BY AB    Enterobacter cloacae complex NOT DETECTED NOT DETECTED Final   Escherichia coli DETECTED (A) NOT DETECTED Final    Comment: CRITICAL RESULT CALLED TO, READ BACK BY AND VERIFIED WITH: PHARMD J LEDFORD 10/05/2023 @ 0230 BY AB    Klebsiella aerogenes NOT DETECTED NOT DETECTED Final   Klebsiella oxytoca NOT DETECTED NOT DETECTED Final   Klebsiella pneumoniae NOT DETECTED NOT DETECTED Final   Proteus species NOT DETECTED NOT DETECTED Final   Salmonella species NOT DETECTED NOT DETECTED Final   Serratia marcescens NOT DETECTED NOT DETECTED Final  Haemophilus influenzae NOT DETECTED NOT DETECTED Final   Neisseria meningitidis NOT DETECTED NOT DETECTED Final   Pseudomonas aeruginosa NOT DETECTED NOT DETECTED Final   Stenotrophomonas maltophilia NOT DETECTED NOT DETECTED Final   Candida albicans NOT DETECTED NOT DETECTED Final   Candida auris NOT DETECTED NOT DETECTED Final   Candida glabrata NOT DETECTED NOT DETECTED Final   Candida krusei NOT DETECTED NOT DETECTED Final   Candida parapsilosis NOT DETECTED NOT DETECTED Final   Candida tropicalis NOT DETECTED NOT DETECTED Final   Cryptococcus neoformans/gattii NOT DETECTED NOT DETECTED Final   CTX-M ESBL NOT DETECTED NOT DETECTED Final   Carbapenem resistance IMP NOT DETECTED NOT DETECTED Final   Carbapenem resistance KPC NOT DETECTED NOT DETECTED Final   Carbapenem resistance NDM NOT DETECTED NOT DETECTED Final   Carbapenem resist OXA 48 LIKE NOT DETECTED NOT  DETECTED Final   Carbapenem resistance VIM NOT DETECTED NOT DETECTED Final    Comment: Performed at [facility], [address], [place], [place] [address]  Blood culture (routine x 2)     Status: Abnormal   Collection Time: 10/04/23 10:30 AM   Specimen: BLOOD  Result Value Ref Range Status   Specimen Description BLOOD RIGHT ANTECUBITAL  Final   Special Requests   Final    BOTTLES DRAWN AEROBIC AND ANAEROBIC Blood Culture results may not be optimal due to an inadequate volume of blood received in culture bottles   Culture  Setup Time   Final    GRAM NEGATIVE RODS IN BOTH AEROBIC AND ANAEROBIC BOTTLES CRITICAL VALUE NOTED.  VALUE IS CONSISTENT WITH PREVIOUSLY REPORTED AND CALLED VALUE.    Culture (A)  Final    ESCHERICHIA COLI SUSCEPTIBILITIES PERFORMED ON PREVIOUS CULTURE WITHIN THE LAST 5 DAYS. Performed at [facility], [address], [place], [place] [address]    Report Status 10/07/2023 FINAL  Final  Aerobic/Anaerobic Culture w Gram Stain (surgical/deep wound)     Status: None (Preliminary result)   Collection Time: 10/04/23  4:38 PM   Specimen: Path fluid; Body Fluid  Result Value Ref Range Status   Specimen Description FLUID  Final   Special Requests RIGHT RENAL PELVIS  Final   Gram Stain   Final    RARE WBC PRESENT, PREDOMINANTLY PMN FEW GRAM POSITIVE COCCI FEW GRAM NEGATIVE RODS Performed at [facility], [address] [place], [place] [address]    Culture   Final    RARE ESCHERICHIA COLI RARE MICROCOCCUS SPECIES MICROCOCCUS LUTEUS Standardized susceptibility testing for this organism is not available. NO ANAEROBES ISOLATED; CULTURE IN PROGRESS FOR 5 DAYS    Report Status PENDING  Incomplete   Organism ID, Bacteria ESCHERICHIA COLI  Final      Susceptibility   Escherichia coli - MIC*    AMPICILLIN 4 SENSITIVE Sensitive     CEFAZOLIN  (NON-URINE) <=1 SENSITIVE Sensitive     CEFEPIME  <=0.12 SENSITIVE Sensitive     ERTAPENEM <=0.12 SENSITIVE  Sensitive     CEFTRIAXONE  <=0.25 SENSITIVE Sensitive     CIPROFLOXACIN  <=0.06 SENSITIVE Sensitive     GENTAMICIN 8 INTERMEDIATE Intermediate     MEROPENEM <=0.25 SENSITIVE Sensitive     TRIMETH/SULFA <=20 SENSITIVE Sensitive     AMPICILLIN/SULBACTAM <=2 SENSITIVE Sensitive     PIP/TAZO Value in next row Sensitive ug/mL     <=4 SENSITIVEThis is a modified FDA-approved test that has been validated and its performance characteristics determined by the reporting laboratory.  This laboratory is certified  under the Clinical Laboratory Improvement Amendments CLIA as qualified to perform high complexity clinical laboratory testing.    * RARE ESCHERICHIA COLI     Time coordinating discharge: Over 30 minutes  SIGNED:   Darcel Dawley, MD  [facility] 10/08/2023, 2:08 PM Pager   If 7PM-7AM, please contact night-coverage

## 2023-10-09 ENCOUNTER — Telehealth: Payer: Self-pay | Admitting: *Deleted

## 2023-10-09 LAB — AEROBIC/ANAEROBIC CULTURE W GRAM STAIN (SURGICAL/DEEP WOUND)

## 2023-10-09 NOTE — Transitions of Care (Post Inpatient/ED Visit) (Signed)
 10/09/2023  Name: [name] MRN: [id number] DOB: 1960/11/25  Today's TOC FU Call Status: Today's TOC FU Call Status:: Successful TOC FU Call Completed TOC FU Call Complete Date: 10/09/23 Patient's Name and Date of Birth confirmed.  Transition Care Management Follow-up Telephone Call Date of Discharge: 10/08/23 Discharge Facility: [facility]) Type of Discharge: Inpatient Admission Primary Inpatient Discharge Diagnosis:: Sepsis secondary to UTI/ kidney stone with ERCP and cystoscopy How have you been since you were released from the hospital?: Better (I am doing just fine; still having pain, but that is to be expected.  I am not comfortable with all these questions you are asking me.  I appreciate the call, but am fully capable of making my own appointments- I don't need or want any help) Any questions or concerns?: No  Items Reviewed: Did you receive and understand the discharge instructions provided?: Yes (thoroughly reviewed with patient who verbalizes good understanding of same) Medications obtained,verified, and reconciled?: Yes (Medications Reviewed) (Full medication reconciliation/ review completed; no concerns or discrepancies identified; confirmed patient obtained/ is taking all newly Rx'd medications as instructed; self-manages medications and denies questions/ concerns around medications today) Any new allergies since your discharge?: No Dietary orders reviewed?: Yes (I eat as healthy as I can) Do you have support at home?: Yes People in Home [RPT]: spouse Name of Support/Comfort Primary Source: Reports independent in self-care activities; spouse assists as/ if needed/ indicated  Medications Reviewed Today: Medications Reviewed Today     Reviewed by Jamil Armwood M, RN (Registered Nurse) on 10/09/23 at 1524  Med List Status: <None>   Medication Order Taking? Sig Documenting Provider Last Dose Status Informant  albuterol  (VENTOLIN  HFA) 108 (90 Base) MCG/ACT  inhaler [id number] Yes INHALE 2 PUFFS INTO THE LUNGS EVERY 6 HOURS AS NEEDED Norleen Lynwood ORN, MD  Active Self  amoxicillin -clavulanate (AUGMENTIN ) 875-125 MG tablet [id number] Yes Take 1 tablet by mouth every 8 (eight) hours for 10 days. Overton Constance DASEN, MD  Active   atorvastatin  (LIPITOR) 20 MG tablet [id number] Yes Take 1 tablet (20 mg total) by mouth daily. Norleen Lynwood ORN, MD  Active Self  calcium  carbonate (TUMS - DOSED IN MG ELEMENTAL CALCIUM ) 500 MG chewable tablet [id number]  Chew 1 tablet by mouth daily as needed for indigestion or heartburn.  Patient not taking: Reported on 10/09/2023   [provider]  Active Self  famotidine  (PEPCID ) 20 MG tablet [id number] Yes Take 1 tablet (20 mg total) by mouth 2 (two) times daily. Norleen Lynwood ORN, MD  Active Self  fluticasone  furoate-vilanterol (BREO ELLIPTA ) 200-25 MCG/ACT AEPB [id number]  Inhale 1 puff into the lungs daily.  Patient not taking: Reported on 10/09/2023   Norleen Lynwood ORN, MD  Active Self  LORazepam  (ATIVAN ) 0.5 MG tablet [id number] Yes Take 1 tablet (0.5 mg total) by mouth 2 (two) times daily as needed for anxiety. Norleen Lynwood ORN, MD  Active Self  losartan -hydrochlorothiazide  (HYZAAR) 100-25 MG tablet [id number] Yes Take 1 tablet by mouth daily. Norleen Lynwood ORN, MD  Active Self  metoprolol  succinate (TOPROL -XL) 25 MG 24 hr tablet [id number] Yes Take 1 tablet (25 mg total) by mouth daily. Terra Fairy PARAS, PA-C  Active Self  ondansetron  (ZOFRAN -ODT) 4 MG disintegrating tablet [id number] Yes TAKE 1 TABLET BY MOUTH EVERY 8 HOURS AS NEEDED FOR NAUSEA AND VOMITING Webb, Padonda B, FNP  Active Self  oxybutynin  (DITROPAN ) 5 MG tablet [id number] Yes Take 1 tablet (5 mg total) by mouth 3 (three) times daily. Leotis,  Pardeep, MD  Active   oxyCODONE  (OXY IR/ROXICODONE ) 5 MG immediate release tablet [id number] Yes Take 1 tablet (5 mg total) by mouth every 4 (four) hours as needed for up to 3 days for breakthrough pain. Leotis Bogus, MD  Active   potassium chloride   (KLOR-CON  10) 10 MEQ tablet [id number] Yes Take 1 tablet (10 mEq total) by mouth daily. Norleen Lynwood ORN, MD  Active Self  rivaroxaban  (XARELTO ) 20 MG TABS tablet [id number] Yes Take 1 tablet (20 mg total) by mouth daily with supper. Terra Fairy PARAS, PA-C  Active Self           Med Note (SATTERFIELD, TEENA FORBES Repress Oct 04, 2023  8:42 PM) Patient indeed verified she is taking this medication at 3 am  triamcinolone  cream (KENALOG ) 0.1 % [id number] Yes APPLY 1 APPLICATION TOPICALLY 2 (TWO) TIMES DAILY AS NEEDED (ITCHING). Norleen Lynwood ORN, MD  Active Self           Home Care and Equipment/Supplies: Were Home Health Services Ordered?: No Any new equipment or medical supplies ordered?: No  Functional Questionnaire: Do you need assistance with bathing/showering or dressing?: No Do you need assistance with meal preparation?: No Do you need assistance with eating?: No Do you have difficulty maintaining continence: No Do you need assistance with getting out of bed/getting out of a chair/moving?: No Do you have difficulty managing or taking your medications?: No  Follow up appointments reviewed: PCP Follow-up appointment confirmed?: No (Patient declined offer to assist with scheduling HFU with PCP- states she works and prefers to call and schedule on her own) MD Provider Line Number:[phone] Given: No (verified well-established with current PCP) Specialist Hospital Follow-up appointment confirmed?: No (Patient reports she has contacted urology provider today- awaiting call-back; confirms has number for specialist office) Reason Specialist Follow-Up Not Confirmed: Patient has Specialist Provider Number and will Call for Appointment Do you need transportation to your follow-up appointment?: No Do you understand care options if your condition(s) worsen?: Yes-patient verbalized understanding  SDOH Interventions Today    Flowsheet Row Most Recent Value  SDOH Interventions   Food Insecurity  Interventions Intervention Not Indicated  [During TOC call today, patient denied food insecurity]  Housing Interventions Intervention Not Indicated  Transportation Interventions Intervention Not Indicated  [husband provides transportation]  Utilities Interventions Intervention Not Indicated   See TOC assessment tabs for additional assessment/ TOC intervention information  Patient declines need for ongoing/ further care management/ coordination outreach; declines enrollment in 30-day TOC program- declines taking my direct phone number should needs/ concerns arise post-TOC call   Pls call/ message for questions,  Shealynn Saulnier Mckinney Sherelle Castelli, RN, BSN, CCRN Alumnus RN Care Manager  Transitions of Care  [facility] [phone]: direct office

## 2023-10-16 ENCOUNTER — Ambulatory Visit (HOSPITAL_COMMUNITY): Admission: EM | Admit: 2023-10-16 | Discharge: 2023-10-16 | Disposition: A | Discharge status: Home / Self Care

## 2023-10-16 ENCOUNTER — Encounter (HOSPITAL_COMMUNITY): Payer: Self-pay

## 2023-10-16 DIAGNOSIS — N200 Calculus of kidney: Secondary | ICD-10-CM | POA: Diagnosis not present

## 2023-10-16 DIAGNOSIS — M545 Low back pain, unspecified: Secondary | ICD-10-CM

## 2023-10-16 DIAGNOSIS — N201 Calculus of ureter: Secondary | ICD-10-CM | POA: Diagnosis not present

## 2023-10-16 DIAGNOSIS — R350 Frequency of micturition: Secondary | ICD-10-CM | POA: Diagnosis not present

## 2023-10-16 DIAGNOSIS — N10 Acute tubulo-interstitial nephritis: Secondary | ICD-10-CM | POA: Diagnosis not present

## 2023-10-16 DIAGNOSIS — N132 Hydronephrosis with renal and ureteral calculous obstruction: Secondary | ICD-10-CM | POA: Diagnosis not present

## 2023-10-16 NOTE — ED Triage Notes (Signed)
 Pt c/o rt hip pain radiating to rt inner thigh x4 wks. Denies injury. States pain started in lower back. States took ibuprofen  with relief.

## 2023-10-16 NOTE — ED Provider Notes (Signed)
 [facility]    CSN: [id number] Arrival date & time: 10/16/23  1238      History   Chief Complaint Chief Complaint  Patient presents with   Hip Pain    HPI [name] is a 63 y.o. female.  1 month history of right sided low back pain that radiates to hip and right groin. No injury, trauma, or fall  1 week ago, she went to the ED for this pain. Was admitted to the ED for sepsis secondary to UTI. CT abd/pelvis, found 8 mm stone in right ureter with obstruction and hydronephrosis. Had stent placed.  She was given oral augmentin  to take for total of 2 weeks. Currently still taking.   She did not know that her kidney function was reduced while in the ED. Was advised to avoid nephrotoxic medications. However she has been using 800 mg ibuprofen  for last 3 days. Supposed to have CMP check with PCP.  Today she is not having fever or chills, dysuria, urinary frequency or urgency, hematuria. Just the pain on the right back, hip, groin. Pain is 5/10  She sees her urologist at 3:30 pm today for re-evaluation and stent removal.  Past Medical History:  Diagnosis Date   Abscess of bladder 07/28/2016   Alcohol dependence (HCC)    Allergic rhinitis 12/06/2013   Anxiety    Asthma 03/16/2015   Atrial flutter with rapid ventricular response (HCC) 04/01/2020   COLONIC POLYPS, HX OF 04/05/2010   DIVERTICULITIS, HX OF 04/05/2010   DJD (degenerative joint disease)    right knee, mot to severe   Dysrhythmia    GERD (gastroesophageal reflux disease)    no meds   Heart murmur    hx of    History of kidney stones    Hyperlipidemia    Hypertension    Impaired glucose tolerance 12/06/2013   Morbid obesity with BMI of 50.0-59.9, adult (HCC)    Nausea & vomiting 05/24/2022   Peripheral vascular disease (HCC)    Pulmonary embolism (HCC)     Patient Active Problem List   Diagnosis Date Noted   Sepsis secondary to UTI (HCC) 10/04/2023   Hypercoagulable state due to paroxysmal  atrial fibrillation (HCC) 12/19/2022   Acute unilateral obstructive uropathy 08/14/2022   Chronic kidney disease, stage 3a (HCC) 08/14/2022   Alcohol withdrawal with perceptual disturbances (HCC) 05/24/2022   Nausea & vomiting 05/24/2022   Abdominal pain 05/24/2022   History of asthma 05/24/2022   Elevated AST (SGOT) 05/24/2022   Prediabetes 05/24/2022   Asthma, moderate persistent, poorly-controlled 11/13/2021   Cough 11/06/2021   Wheezing 11/06/2021   Chronic anticoagulation 11/06/2021   Bacteremia 09/06/2021   AKI (acute kidney injury) (HCC) 09/06/2021   Right ureteral stone 09/06/2021   Hydronephrosis of right kidney 09/06/2021   Acute pyelonephritis 09/06/2021   Alcohol dependence with withdrawal delirium (HCC) 09/02/2021   Vitamin D  deficiency 03/13/2021   Microhematuria 03/13/2021   Tear of distal tendon of biceps 11/16/2020   Positive blood cultures    Asthma exacerbation 07/22/2020   Hyperkalemia 07/22/2020   Paroxysmal atrial fibrillation (HCC) 04/01/2020   Atrial flutter with rapid ventricular response (HCC) 04/01/2020   Alcohol dependence with withdrawal (HCC) 12/19/2019   History of pulmonary embolism 12/19/2019   Pneumonia due to COVID-19 virus 02/10/2019   Pulmonary embolism (HCC) 07/06/2018   Leukocytosis 09/22/2017   Rash 09/22/2017   Sepsis (HCC) 09/12/2016   Colovesical fistula 09/12/2016   Alcohol abuse 07/28/2016   Colonic diverticulum  07/28/2016   Hyperlipidemia 07/10/2016   Mass of left side of neck 07/10/2016   Head and neck lymphadenopathy 07/10/2016   Eustachian tube disorder 01/25/2016   Peripheral edema 03/16/2015   Asthma 03/16/2015   Right shoulder pain 03/16/2015   Hypokalemia 10/04/2014   Upper airway cough syndrome 10/03/2014   Morbid obesity with BMI of 50.0-59.9, adult (HCC) 10/03/2014   Lower back pain 05/25/2014   Bilateral shoulder pain 05/25/2014   Allergic rhinitis 12/06/2013   Impaired glucose tolerance 12/06/2013   Expected  blood loss anemia 12/16/2012   Morbid obesity (HCC) 12/15/2012   S/P left TKA 12/14/2012   Goiter 11/26/2012   Preop exam for internal medicine 11/26/2012   Vaginal bleeding 04/30/2011   Colon polyps 02/10/2011   Eczema 02/10/2011   Depression 02/10/2011   Encounter for well adult exam with abnormal findings 09/27/2010   Anxiety state 04/05/2010   DIVERTICULITIS, HX OF 04/05/2010   PALPITATIONS, HX OF 09/14/2007   Essential hypertension 04/24/2007   VOCAL CORD DISORDER 04/24/2007   Extrinsic asthma 04/24/2007   GERD 04/24/2007    Past Surgical History:  Procedure Laterality Date   ABDOMINAL HYSTERECTOMY  age 64   fibroids   BREAST BIOPSY Left    COLONOSCOPY WITH PROPOFOL  N/A 07/25/2016   Procedure: COLONOSCOPY WITH PROPOFOL ;  Surgeon: Rollin Dover, MD;  Location: [facility];  Service: Endoscopy;  Laterality: N/A;   colonscopy     x 2   CYSTOSCOPY W/ URETERAL STENT PLACEMENT Right 09/05/2021   Procedure: CYSTOSCOPY WITH RETROGRADE PYELOGRAM/URETERAL STENT PLACEMENT;  Surgeon: Selma Donnice SAUNDERS, MD;  Location: [facility];  Service: Urology;  Laterality: Right;   CYSTOSCOPY W/ URETERAL STENT PLACEMENT Right 10/04/2023   Procedure: CYSTOSCOPY, WITH RETROGRADE PYELOGRAM AND URETERAL STENT INSERTION;  Surgeon: Shane Steffan BROCKS, MD;  Location: [facility];  Service: Urology;  Laterality: Right;   CYSTOSCOPY WITH RETROGRADE PYELOGRAM, URETEROSCOPY AND STENT PLACEMENT Left 08/14/2022   Procedure: CYSTOSCOPY WITH RETROGRADE PYELOGRAM;  Surgeon: Selma Donnice SAUNDERS, MD;  Location: [facility];  Service: Urology;  Laterality: Left;   CYSTOSCOPY WITH URETEROSCOPY AND STENT PLACEMENT Left 08/14/2022   Procedure: URETEROSCOPY AND STENT PLACEMENT;  Surgeon: Selma Donnice SAUNDERS, MD;  Location: [facility] OR;  Service: Urology;  Laterality: Left;   CYSTOSCOPY/URETEROSCOPY/HOLMIUM LASER/STENT PLACEMENT Right 09/30/2021   Procedure: CYSTOSCOPY/ RETROGRADE/URETEROSCOPY/HOLMIUM LASER/STENT PLACEMENT;  Surgeon: Selma Donnice SAUNDERS, MD;   Location: [facility];  Service: Urology;  Laterality: Right;   CYSTOSCOPY/URETEROSCOPY/HOLMIUM LASER/STENT PLACEMENT Left 09/12/2022   Procedure: CYSTOSCOPY/LEFT RETROGRADE PYELOGRAM/LEFT URETEROSCOPY/HOLMIUM LASER/LEFT STENT EXCHANGE;  Surgeon: Selma Donnice SAUNDERS, MD;  Location: [facility];  Service: Urology;  Laterality: Left;  75 MINUTES NEEDED FOR CASE   IR RADIOLOGIST EVAL & MGMT  08/12/2016   IR RADIOLOGIST EVAL & MGMT  08/21/2016   KNEE ARTHROSCOPY     left    TOTAL KNEE ARTHROPLASTY  07/29/2011   Procedure: TOTAL KNEE ARTHROPLASTY;  Surgeon: Donnice JONETTA Car, MD;  Location: [facility];  Service: Orthopedics;  Laterality: Right;   TOTAL KNEE ARTHROPLASTY Left 12/14/2012   Procedure: LEFT TOTAL KNEE ARTHROPLASTY;  Surgeon: Donnice JONETTA Car, MD;  Location: [facility];  Service: Orthopedics;  Laterality: Left;    OB History     Gravida  1   Para  1   Term      Preterm      AB      Living  1      SAB      IAB  Ectopic      Multiple      Live Births               Home Medications    Prior to Admission medications   Medication Sig Start Date End Date Taking? Authorizing Provider  albuterol  (VENTOLIN  HFA) 108 (90 Base) MCG/ACT inhaler INHALE 2 PUFFS INTO THE LUNGS EVERY 6 HOURS AS NEEDED 08/06/23   Norleen Lynwood ORN, MD  amoxicillin -clavulanate (AUGMENTIN ) 875-125 MG tablet Take 1 tablet by mouth every 8 (eight) hours for 10 days. 10/07/23 10/18/23  Vu, Constance DASEN, MD  atorvastatin  (LIPITOR) 20 MG tablet Take 1 tablet (20 mg total) by mouth daily. 08/06/23   Norleen Lynwood ORN, MD  calcium  carbonate (TUMS - DOSED IN MG ELEMENTAL CALCIUM ) 500 MG chewable tablet Chew 1 tablet by mouth daily as needed for indigestion or heartburn. Patient not taking: Reported on 10/09/2023    [provider]  famotidine  (PEPCID ) 20 MG tablet Take 1 tablet (20 mg total) by mouth 2 (two) times daily. 08/06/23   Norleen Lynwood ORN, MD  fluticasone  furoate-vilanterol (BREO ELLIPTA ) 200-25 MCG/ACT AEPB Inhale 1 puff into  the lungs daily. Patient not taking: Reported on 10/09/2023 08/06/23   Norleen Lynwood ORN, MD  LORazepam  (ATIVAN ) 0.5 MG tablet Take 1 tablet (0.5 mg total) by mouth 2 (two) times daily as needed for anxiety. 08/06/23   Norleen Lynwood ORN, MD  losartan -hydrochlorothiazide  (HYZAAR) 100-25 MG tablet Take 1 tablet by mouth daily. 08/06/23   Norleen Lynwood ORN, MD  metoprolol  succinate (TOPROL -XL) 25 MG 24 hr tablet Take 1 tablet (25 mg total) by mouth daily. 12/19/22   Terra Fairy PARAS, PA-C  ondansetron  (ZOFRAN -ODT) 4 MG disintegrating tablet TAKE 1 TABLET BY MOUTH EVERY 8 HOURS AS NEEDED FOR NAUSEA AND VOMITING 07/24/23   Webb, Padonda B, FNP  oxybutynin  (DITROPAN ) 5 MG tablet Take 1 tablet (5 mg total) by mouth 3 (three) times daily. 10/08/23 11/07/23  Leotis Bogus, MD  potassium chloride  (KLOR-CON  10) 10 MEQ tablet Take 1 tablet (10 mEq total) by mouth daily. 08/06/23   Norleen Lynwood ORN, MD  rivaroxaban  (XARELTO ) 20 MG TABS tablet Take 1 tablet (20 mg total) by mouth daily with supper. 12/19/22   Terra Fairy PARAS, PA-C  triamcinolone  cream (KENALOG ) 0.1 % APPLY 1 APPLICATION TOPICALLY 2 (TWO) TIMES DAILY AS NEEDED (ITCHING). 08/31/23   Norleen Lynwood ORN, MD    Family History Family History  Problem Relation Age of Onset   Stroke Mother    COPD Father    Lymphoma Sister    Alcoholism Sister     Social History Social History   Tobacco Use   Smoking status: Former    Current packs/day: 0.00    Average packs/day: 0.5 packs/day for 7.0 years (3.5 ttl pk-yrs)    Types: Cigarettes    Start date: 02/11/1976    Quit date: 02/11/1983    Years since quitting: 40.7   Smokeless tobacco: Never  Vaping Use   Vaping status: Never Used  Substance Use Topics   Alcohol use: Yes    Alcohol/week: 1.0 standard drink of alcohol    Types: 1 Shots of liquor per week    Comment: daily half pint   Drug use: Not Currently    Types: Marijuana    Comment: smoked marijuana x 10 years.  Quit in 1985.     Allergies   Morphine , Shrimp  [shellfish allergy], Tramadol , Covid-19 (mrna) vaccine, Diltiazem  hcl, and Latex   Review  of Systems Review of Systems As per HPI  Physical Exam Triage Vital Signs ED Triage Vitals  Encounter Vitals Group     BP 10/16/23 1359 113/71     Girls Systolic BP Percentile --      Girls Diastolic BP Percentile --      Boys Systolic BP Percentile --      Boys Diastolic BP Percentile --      Pulse Rate 10/16/23 1359 89     Resp 10/16/23 1359 18     Temp 10/16/23 1359 98.3 F (36.8 C)     Temp Source 10/16/23 1359 Oral     SpO2 10/16/23 1359 96 %     Weight --      Height --      Head Circumference --      Peak Flow --      Pain Score 10/16/23 1400 5     Pain Loc --      Pain Education --      Exclude from Growth Chart --    No data found.  Updated Vital Signs BP 113/71 (BP Location: Right Arm)   Pulse 89   Temp 98.3 F (36.8 C) (Oral)   Resp 18   SpO2 96%    Physical Exam Vitals and nursing note reviewed.  Constitutional:      General: She is not in acute distress. HENT:     Mouth/Throat:     Pharynx: Oropharynx is clear.  Cardiovascular:     Rate and Rhythm: Normal rate and regular rhythm.     Pulses: Normal pulses.  Pulmonary:     Effort: Pulmonary effort is normal.  Abdominal:     Tenderness: There is no abdominal tenderness.     Comments: Habitus limits exam  Musculoskeletal:     Cervical back: Normal range of motion.     Comments: No bony tenderness of back or hip. Distal sensation intact. Strength is 5/5. DP pulses 2+  Skin:    Capillary Refill: Capillary refill takes less than 2 seconds.  Neurological:     Mental Status: She is alert and oriented to person, place, and time.     UC Treatments / Results  Labs (all labs ordered are listed, but only abnormal results are displayed) Labs Reviewed - No data to display  EKG  Radiology No results found.  Procedures Procedures   Medications Ordered in UC Medications - No data to display  Initial  Impression / Assessment and Plan / UC Course  I have reviewed the triage vital signs and the nursing notes.  Pertinent labs & imaging results that were available during my care of the patient were reviewed by me and considered in my medical decision making (see chart for details).  Sees her urologist in 30 minutes for stent removal I have discussed with patient the right side back/hip/groin pain is very likely related to the 8 mm stone in her right ureter.  Otherwise it may be musculoskeletal which would need follow up with PCP. Do not recommend xray here as she already had CT pelvis without osseus abnormality.   I have discussed with her the reduced kidney function found on metabolic panels. Advised against taking further ibuprofen .  Discussed there is little else urgent care can do for pain control at this time. Cannot give toradol  or steroids as do not want to further affect kidney function. She is on robaxin  from the ED  If she was not already going to see her  urologist in 30 minutes, I would have advise she go to the emergency department for repeat imaging given her persisting pain. However she is going for stent removal. Recommend to discuss the pain with the urologist when she is there.  Patient verbalizes understanding   At discharge she was worried about a sinus infection Discussed she is already on Augmentin  which would treat sinuses.  Final Clinical Impressions(s) / UC Diagnoses   Final diagnoses:  Acute right-sided low back pain without sciatica  Right nephrolithiasis     Discharge Instructions      Please follow with your urologist today at your 3:30 pm visit.  As discussed your right side back pain is likely related to the kidney stone.  Otherwise, it could be musculoskeletal. This you would need to see your primary care provider for.   If your pain is worsening, please return to the emergency department.      ED Prescriptions   None    PDMP not reviewed  this encounter.   Jeryl Stabs, [place] 10/16/23 1536

## 2023-10-16 NOTE — Discharge Instructions (Addendum)
 Please follow with your urologist today at your 3:30 pm visit.  As discussed your right side back pain is likely related to the kidney stone.  Otherwise, it could be musculoskeletal. This you would need to see your primary care provider for.   If your pain is worsening, please return to the emergency department.

## 2023-10-22 ENCOUNTER — Other Ambulatory Visit: Payer: Self-pay | Admitting: Urology

## 2023-10-22 ENCOUNTER — Telehealth: Payer: Self-pay

## 2023-10-22 NOTE — Telephone Encounter (Signed)
   Pre-operative Risk Assessment    Patient Name: [name]  DOB: 25-Jul-1960 MRN: [id number]   Date of last office visit: NONE Date of next office visit: NONE   Request for Surgical Clearance    Procedure:  CYSTOSCOPY/URETEROSCOPY/HOLMIUM LASER/STENT PLACEMENT, CYSTOSCOPY, WITH RETROGRADE PYELOGRAM  Date of Surgery:  Clearance 10/30/23                                Surgeon:  DR DONNICE PEREYRA Surgeon's Group or Practice Name:  [facility] Phone number:  [phone]  [phone] Fax number:  [phone]   ATTN: CONNIE   Type of Clearance Requested:   - Medical  - Pharmacy:  Hold Rivaroxaban  (Xarelto )     Type of Anesthesia:  General    Additional requests/questions:    SignedLucie DELENA Ku   10/22/2023, 4:43 PM

## 2023-10-23 NOTE — Telephone Encounter (Signed)
 Called patient to schedule New PT IN OFFICE appt to also address preop clearance. While talking to the patient she stats that she was told yesterday (10/22/23) that she did not need to be see with our office. She stated that she talk to [employer].  I told the patient that I needed to confirm that with [facility].  I called [facility]. Jori got me over to [employer]. Zona stated that she was under the impression that the Afib clinic did not address preop clearance. She stated that she was told by the pt that she talked to the [facility] Clinic and they could clear her. Zona stated that she called the Afib clinic and she was told that if the patient just needed clearance for the Xarelto  they could do the clearance. If the patient needed medical clearance they could not do the clearance.  I confirmed with Zona at [facility] that pt was only needing clearance for the holding of Xarelto  not medical clearance. Zona stated that they had received the clearance fore the Xarelto  from the [facility] Clinic.  PT is NOT needing IN OFFICE appt with our office.

## 2023-10-23 NOTE — Telephone Encounter (Signed)
   Name: [name]  DOB: December 21, 1960  MRN: [id number]  Primary Cardiologist: None   Preoperative team, please contact this patient and set up a phone call appointment for further preoperative risk assessment. Please obtain consent and complete medication review. Thank you for your help.  I confirm that guidance regarding antiplatelet and oral anticoagulation therapy has been completed and, if necessary, noted below.  Pharmacy notified of need to hold Xarelto  in separate request.   I also confirmed the patient resides in the state of [place] . As per [facility] Medical Board telemedicine laws, the patient must reside in the state in which the provider is licensed.   Lamarr Satterfield, NP 10/23/2023, 8:46 AM [facility]

## 2023-10-23 NOTE — Telephone Encounter (Signed)
 Patient with diagnosis of afib on Xarelto  for anticoagulation.    Procedure: CYSTOSCOPY/URETEROSCOPY/HOLMIUM LASER/STENT PLACEMENT, CYSTOSCOPY, WITH RETROGRADE PYELOGRAM  Date of procedure: 10/30/23   CHA2DS2-VASc Score = 3   This indicates a 3.2% annual risk of stroke. The patient's score is based upon: CHF History: 0 HTN History: 1 Diabetes History: 0 Stroke History: 0 Vascular Disease History: 1 Age Score: 0 Gender Score: 1      Hx of PE  CrCl 63 ml/min Platelet count 235  Patient has not had an Afib/aflutter ablation or Watchman within the last 3 months or DCCV within the last 30 days   Per office protocol, patient can hold Xarelto  for 2 days prior to procedure.    **This guidance is not considered finalized until pre-operative APP has relayed final recommendations.**

## 2023-10-23 NOTE — Telephone Encounter (Signed)
 Noted! Thank you

## 2023-10-23 NOTE — Telephone Encounter (Signed)
 Pharmacy please advise on holding Xarelto  prior to  CYSTOSCOPY/URETEROSCOPY/HOLMIUM LASER/STENT PLACEMENT, CYSTOSCOPY, WITH RETROGRADE PYELOGRAM  scheduled for 10/30/2023. Last labs (CBC/BMET) 10/08/2023. Thank you.

## 2023-10-23 NOTE — Telephone Encounter (Signed)
   Name: [name]  DOB: [birth year]  MRN: [id number]  Primary Cardiologist: None  Chart reviewed as part of pre-operative protocol coverage. Because of [name]'s past medical history and time since last visit, she will require a follow-up in-office visit ASAP in order to better assess preoperative cardiovascular risk.  Pre-op covering staff: - Please schedule appointment and call patient to inform them. If patient already had an upcoming appointment within acceptable timeframe, please add pre-op clearance to the appointment notes so provider is aware. - Please contact requesting surgeon's office via preferred method (i.e, phone, fax) to inform them of need for appointment prior to surgery.  Pharmacy has been notified of need to hold Xarelto .   Lamarr Satterfield, NP  10/23/2023, 9:12 AM

## 2023-10-26 ENCOUNTER — Encounter (HOSPITAL_COMMUNITY): Payer: Self-pay | Admitting: Urology

## 2023-10-26 NOTE — Progress Notes (Signed)
 For Anesthesia: PCP - Lynwood LELON Rush, MD  Cardiologist - Dorn Heinrich, PA-C   Bowel Prep reminder:  Chest x-ray - 04/29/23 in [facility] EKG - 10/04/23 in [facility] Stress Test -  ECHO - 04/03/20 in [facility] Cardiac Cath -  Pacemaker/ICD device last checked: Pacemaker orders received: Device Rep notified:  Spinal Cord Stimulator:  Sleep Study -  CPAP -   Fasting Blood Sugar -  Checks Blood Sugar _____ times a day Date and result of last Hgb A1c-  Last dose of GLP1 agonist-  GLP1 instructions: Hold 7 days prior to schedule (Hold 24 hours-daily)   Last dose of SGLT-2 inhibitors-  SGLT-2 instructions: Hold 72 hours prior to surgery  Blood Thinner Instructions: Xarelto  Per office protocol, patient can hold Xarelto  for 2 days prior to procedure.   Last Dose: Time last taken:  Aspirin  Instructions: Last Dose: Time last taken:  Activity level: Can go up a flight of stairs and activities of daily living without stopping and without chest pain and/or shortness of breath   Able to exercise without chest pain and/or shortness of breath   Unable to go up a flight of stairs without chest pain and/or shortness of breath     Anesthesia review: Atrial flutter/RVR, Heart mumur, HTN, History of PE  Patient denies shortness of breath, fever, cough and chest pain at PAT appointment   Patient verbalized understanding of instructions that were reviewed over the telephone.

## 2023-10-27 NOTE — Progress Notes (Signed)
 Attempted to obtain medical history via telephone, unable to reach at this time. HIPAA compliant voicemail message left requesting return call to pre surgical testing department.

## 2023-10-28 ENCOUNTER — Other Ambulatory Visit: Payer: Self-pay

## 2023-10-28 ENCOUNTER — Encounter (HOSPITAL_COMMUNITY): Payer: Self-pay | Admitting: Urology

## 2023-10-28 NOTE — Progress Notes (Signed)
 Attempted to obtain medical history via telephone, unable to reach at this time. HIPAA compliant voicemail message left requesting return call to pre surgical testing department.

## 2023-10-29 NOTE — Progress Notes (Signed)
 Anesthesia Chart Review   Case: [id number] Date/Time: 10/30/23 1145   Procedures:      CYSTOSCOPY/URETEROSCOPY/HOLMIUM LASER/STENT PLACEMENT (Right)     CYSTOSCOPY, WITH RETROGRADE PYELOGRAM (Right)   Anesthesia type: General   Diagnosis: Ureteral stone [N20.1]   Pre-op diagnosis: RIGHT URETERAL STONE   Location: [facility] 05 / [facility]   Surgeons: Selma Donnice SAUNDERS, MD       DISCUSSION:63 y.o. former smoker with h/o alcohol abuse, HTN, asthma, PE, atrial fibrillation, PVD, right ureteral stone scheduled for above procedure 10/30/2023 with Dr. Selma Donnice.   Admission 8/24-8/28/2025 with sepsis secondary to UTI, underwent cystoscopy with stent placement.   Pt follows with a-fib clinic, last seen 12/19/2022. At this visit pt asymptomatic. Per notes paroxysmal a-fib likely in setting of alcohol abuse, continues on metoprolol  and Xarelto . 1 year follow up recommended at this visit. Per a-fib clinic pt can hold Xarelto  2 days prior to procedure. No recent recurrent a-fib. Pt denies chest pain or shortness of breath, reports she can climb a flight of stairs without sx.   VS: Ht 5' 1.5 (1.562 m)   Wt 127 kg   BMI 52.05 kg/m   PROVIDERS: Norleen Lynwood ORN, MD is PCP    LABS: Labs reviewed: Acceptable for surgery. (all labs ordered are listed, but only abnormal results are displayed)  Labs Reviewed - No data to display   IMAGES:   EKG:   CV: Echo 04/03/20  1. Left ventricular ejection fraction, by estimation, is 50 to 55%. The  left ventricle has low normal function. The left ventricle has no regional  wall motion abnormalities. There is mild concentric left ventricular  hypertrophy. Left ventricular  diastolic parameters are indeterminate.   2. Right ventricular systolic function is normal. The right ventricular  size is normal. Tricuspid regurgitation signal is inadequate for assessing  PA pressure.   3. Left atrial size was mildly dilated.   4. Right atrial size was mildly dilated.    5. A small pericardial effusion is present. The pericardial effusion is  circumferential.   6. The mitral valve is normal in structure. No evidence of mitral valve  regurgitation. No evidence of mitral stenosis.   7. The aortic valve is grossly normal. Aortic valve regurgitation is not  visualized. No aortic stenosis is present.   8. The inferior vena cava is normal in size with <50% respiratory  variability, suggesting right atrial pressure of 8 mmHg.  Past Medical History:  Diagnosis Date   Abscess of bladder 07/28/2016   Alcohol dependence (HCC)    Allergic rhinitis 12/06/2013   Anxiety    Asthma 03/16/2015   Atrial flutter with rapid ventricular response (HCC) 04/01/2020   COLONIC POLYPS, HX OF 04/05/2010   COVID-19 09/2023   DIVERTICULITIS, HX OF 04/05/2010   DJD (degenerative joint disease)    right knee, mot to severe   Dysrhythmia    Fatty liver    GERD (gastroesophageal reflux disease)    no meds   Heart murmur    hx of    Hemorrhoids    History of kidney stones    Hyperlipidemia    Hypertension    Impaired glucose tolerance 12/06/2013   Morbid obesity with BMI of 50.0-59.9, adult (HCC)    Nausea & vomiting 05/24/2022   Peripheral vascular disease (HCC)    Pulmonary embolism (HCC)     Past Surgical History:  Procedure Laterality Date   ABDOMINAL HYSTERECTOMY  age 61   fibroids  BREAST BIOPSY Left    COLONOSCOPY WITH PROPOFOL  N/A 07/25/2016   Procedure: COLONOSCOPY WITH PROPOFOL ;  Surgeon: Rollin Dover, MD;  [facility];  Service: Endoscopy;  Laterality: N/A;   colonscopy     x 2   CYSTOSCOPY W/ URETERAL STENT PLACEMENT Right 09/05/2021   Procedure: CYSTOSCOPY WITH RETROGRADE PYELOGRAM/URETERAL STENT PLACEMENT;  Surgeon: Selma Donnice SAUNDERS, MD;  Location[facility];  Service: Urology;  Laterality: Right;   CYSTOSCOPY W/ URETERAL STENT PLACEMENT Right 10/04/2023   Procedure: CYSTOSCOPY, WITH RETROGRADE PYELOGRAM AND URETERAL STENT INSERTION;  Surgeon:  Shane Steffan BROCKS, MD;  Location: [facility] OR;  Service: Urology;  Laterality: Right;   CYSTOSCOPY WITH RETROGRADE PYELOGRAM, URETEROSCOPY AND STENT PLACEMENT Left 08/14/2022   Procedure: CYSTOSCOPY WITH RETROGRADE PYELOGRAM;  Surgeon: Selma Donnice SAUNDERS, MD;  Location: [facility] OR;  Service: Urology;  Laterality: Left;   CYSTOSCOPY WITH URETEROSCOPY AND STENT PLACEMENT Left 08/14/2022   Procedure: URETEROSCOPY AND STENT PLACEMENT;  Surgeon: Selma Donnice SAUNDERS, MD;  Location: [facility] OR;  Service: Urology;  Laterality: Left;   CYSTOSCOPY/URETEROSCOPY/HOLMIUM LASER/STENT PLACEMENT Right 09/30/2021   Procedure: CYSTOSCOPY/ RETROGRADE/URETEROSCOPY/HOLMIUM LASER/STENT PLACEMENT;  Surgeon: Selma Donnice SAUNDERS, MD;  [facility];  Service: Urology;  Laterality: Right;   CYSTOSCOPY/URETEROSCOPY/HOLMIUM LASER/STENT PLACEMENT Left 09/12/2022   Procedure: CYSTOSCOPY/LEFT RETROGRADE PYELOGRAM/LEFT URETEROSCOPY/HOLMIUM LASER/LEFT STENT EXCHANGE;  Surgeon: Selma Donnice SAUNDERS, MD;  [facility];  Service: Urology;  Laterality: Left;  75 MINUTES NEEDED FOR CASE   IR RADIOLOGIST EVAL & MGMT  08/12/2016   IR RADIOLOGIST EVAL & MGMT  08/21/2016   KNEE ARTHROSCOPY     left    TOTAL KNEE ARTHROPLASTY  07/29/2011   Procedure: TOTAL KNEE ARTHROPLASTY;  Surgeon: Donnice JONETTA Car, MD;  [facility];  Service: Orthopedics;  Laterality: Right;   TOTAL KNEE ARTHROPLASTY Left 12/14/2012   Procedure: LEFT TOTAL KNEE ARTHROPLASTY;  Surgeon: Donnice JONETTA Car, MD;  Location[facility];  Service: Orthopedics;  Laterality: Left;    MEDICATIONS: No current facility-administered medications for this encounter.    albuterol  (VENTOLIN  HFA) 108 (90 Base) MCG/ACT inhaler   atorvastatin  (LIPITOR) 20 MG tablet   calcium  carbonate (TUMS - DOSED IN MG ELEMENTAL CALCIUM ) 500 MG chewable tablet   famotidine  (PEPCID ) 20 MG tablet   fluticasone  furoate-vilanterol (BREO ELLIPTA ) 200-25 MCG/ACT AEPB   LORazepam  (ATIVAN ) 0.5 MG tablet   losartan -hydrochlorothiazide   (HYZAAR) 100-25 MG tablet   metoprolol  succinate (TOPROL -XL) 25 MG 24 hr tablet   ondansetron  (ZOFRAN -ODT) 4 MG disintegrating tablet   oxybutynin  (DITROPAN ) 5 MG tablet   potassium chloride  (KLOR-CON  10) 10 MEQ tablet   rivaroxaban  (XARELTO ) 20 MG TABS tablet   triamcinolone  cream (KENALOG ) 0.1 %     Scotty Pinder Ward, PA-C [facility] [phone]

## 2023-10-30 ENCOUNTER — Encounter (HOSPITAL_COMMUNITY)
Admission: RE | Disposition: A | Discharge status: Home / Self Care | Payer: Self-pay | Source: Home / Self Care | Attending: Urology

## 2023-10-30 ENCOUNTER — Encounter (HOSPITAL_COMMUNITY): Payer: Self-pay | Admitting: Urology

## 2023-10-30 ENCOUNTER — Ambulatory Visit (HOSPITAL_COMMUNITY)
Admission: RE | Admit: 2023-10-30 | Discharge: 2023-10-30 | Disposition: A | Discharge status: Home / Self Care | Attending: Urology | Admitting: Urology

## 2023-10-30 ENCOUNTER — Ambulatory Visit (HOSPITAL_COMMUNITY)

## 2023-10-30 ENCOUNTER — Ambulatory Visit (HOSPITAL_BASED_OUTPATIENT_CLINIC_OR_DEPARTMENT_OTHER): Payer: Self-pay | Admitting: Physician Assistant

## 2023-10-30 ENCOUNTER — Encounter (HOSPITAL_COMMUNITY): Payer: Self-pay | Admitting: Physician Assistant

## 2023-10-30 ENCOUNTER — Other Ambulatory Visit: Payer: Self-pay

## 2023-10-30 DIAGNOSIS — N201 Calculus of ureter: Secondary | ICD-10-CM | POA: Diagnosis not present

## 2023-10-30 DIAGNOSIS — I509 Heart failure, unspecified: Secondary | ICD-10-CM | POA: Insufficient documentation

## 2023-10-30 DIAGNOSIS — E6689 Other obesity not elsewhere classified: Secondary | ICD-10-CM | POA: Diagnosis not present

## 2023-10-30 DIAGNOSIS — Z79899 Other long term (current) drug therapy: Secondary | ICD-10-CM | POA: Diagnosis not present

## 2023-10-30 DIAGNOSIS — Z466 Encounter for fitting and adjustment of urinary device: Secondary | ICD-10-CM | POA: Insufficient documentation

## 2023-10-30 DIAGNOSIS — Z86711 Personal history of pulmonary embolism: Secondary | ICD-10-CM | POA: Diagnosis not present

## 2023-10-30 DIAGNOSIS — F419 Anxiety disorder, unspecified: Secondary | ICD-10-CM | POA: Diagnosis not present

## 2023-10-30 DIAGNOSIS — N289 Disorder of kidney and ureter, unspecified: Secondary | ICD-10-CM | POA: Diagnosis not present

## 2023-10-30 DIAGNOSIS — K219 Gastro-esophageal reflux disease without esophagitis: Secondary | ICD-10-CM | POA: Insufficient documentation

## 2023-10-30 DIAGNOSIS — F1020 Alcohol dependence, uncomplicated: Secondary | ICD-10-CM | POA: Diagnosis not present

## 2023-10-30 DIAGNOSIS — I480 Paroxysmal atrial fibrillation: Secondary | ICD-10-CM | POA: Insufficient documentation

## 2023-10-30 DIAGNOSIS — Z6841 Body Mass Index (BMI) 40.0 and over, adult: Secondary | ICD-10-CM | POA: Insufficient documentation

## 2023-10-30 DIAGNOSIS — I130 Hypertensive heart and chronic kidney disease with heart failure and stage 1 through stage 4 chronic kidney disease, or unspecified chronic kidney disease: Secondary | ICD-10-CM

## 2023-10-30 DIAGNOSIS — Z87891 Personal history of nicotine dependence: Secondary | ICD-10-CM | POA: Insufficient documentation

## 2023-10-30 DIAGNOSIS — N1831 Chronic kidney disease, stage 3a: Secondary | ICD-10-CM | POA: Diagnosis not present

## 2023-10-30 DIAGNOSIS — J45909 Unspecified asthma, uncomplicated: Secondary | ICD-10-CM | POA: Diagnosis not present

## 2023-10-30 DIAGNOSIS — I739 Peripheral vascular disease, unspecified: Secondary | ICD-10-CM | POA: Diagnosis not present

## 2023-10-30 DIAGNOSIS — I110 Hypertensive heart disease with heart failure: Secondary | ICD-10-CM | POA: Insufficient documentation

## 2023-10-30 DIAGNOSIS — Z7901 Long term (current) use of anticoagulants: Secondary | ICD-10-CM | POA: Insufficient documentation

## 2023-10-30 HISTORY — DX: Fatty (change of) liver, not elsewhere classified: K76.0

## 2023-10-30 HISTORY — PX: CYSTOSCOPY/URETEROSCOPY/HOLMIUM LASER/STENT PLACEMENT: SHX6546

## 2023-10-30 HISTORY — DX: Unspecified hemorrhoids: K64.9

## 2023-10-30 HISTORY — PX: CYSTOSCOPY W/ RETROGRADES: SHX1426

## 2023-10-30 SURGERY — CYSTOSCOPY/URETEROSCOPY/HOLMIUM LASER/STENT PLACEMENT
Anesthesia: General | Laterality: Right

## 2023-10-30 MED ORDER — OXYCODONE-ACETAMINOPHEN 5-325 MG PO TABS: 1.0000 | ORAL_TABLET | ORAL | 0 refills | Status: DC | PRN

## 2023-10-30 MED ORDER — SUCCINYLCHOLINE CHLORIDE 200 MG/10ML IV SOSY
PREFILLED_SYRINGE | INTRAVENOUS | Status: DC | PRN
  Administered 2023-10-30: 200 mg via INTRAVENOUS

## 2023-10-30 MED ORDER — ALBUTEROL SULFATE HFA 108 (90 BASE) MCG/ACT IN AERS
INHALATION_SPRAY | RESPIRATORY_TRACT | Status: DC | PRN
  Administered 2023-10-30 (×3): 2 via RESPIRATORY_TRACT

## 2023-10-30 MED ORDER — CEFAZOLIN SODIUM-DEXTROSE 2-4 GM/100ML-% IV SOLN
INTRAVENOUS | Status: AC
  Filled 2023-10-30: qty 100

## 2023-10-30 MED ORDER — CHLORHEXIDINE GLUCONATE 0.12 % MT SOLN
15.0000 mL | Freq: Once | OROMUCOSAL | Status: AC
  Administered 2023-10-30: 15 mL via OROMUCOSAL

## 2023-10-30 MED ORDER — ACETAMINOPHEN 10 MG/ML IV SOLN: 1000.0000 mg | Freq: Once | INTRAVENOUS | Status: DC | PRN

## 2023-10-30 MED ORDER — DOCUSATE SODIUM 100 MG PO CAPS: 100.0000 mg | ORAL_CAPSULE | Freq: Every day | ORAL | 0 refills | Status: DC | PRN

## 2023-10-30 MED ORDER — IOHEXOL 300 MG/ML  SOLN
INTRAMUSCULAR | Status: DC | PRN
  Administered 2023-10-30: 3 mL

## 2023-10-30 MED ORDER — FENTANYL CITRATE PF 50 MCG/ML IJ SOSY
25.0000 ug | PREFILLED_SYRINGE | INTRAMUSCULAR | Status: DC | PRN
  Administered 2023-10-30: 50 ug via INTRAVENOUS

## 2023-10-30 MED ORDER — OXYCODONE HCL 5 MG/5ML PO SOLN: 5.0000 mg | Freq: Once | ORAL | Status: AC | PRN

## 2023-10-30 MED ORDER — LIDOCAINE HCL (CARDIAC) PF 100 MG/5ML IV SOSY
PREFILLED_SYRINGE | INTRAVENOUS | Status: DC | PRN
  Administered 2023-10-30: 80 mg via INTRATRACHEAL

## 2023-10-30 MED ORDER — OXYCODONE HCL 5 MG PO TABS
5.0000 mg | ORAL_TABLET | Freq: Once | ORAL | Status: AC | PRN
  Administered 2023-10-30: 5 mg via ORAL

## 2023-10-30 MED ORDER — FENTANYL CITRATE (PF) 100 MCG/2ML IJ SOLN
INTRAMUSCULAR | Status: AC
  Filled 2023-10-30: qty 2

## 2023-10-30 MED ORDER — MIDAZOLAM HCL 2 MG/2ML IJ SOLN
INTRAMUSCULAR | Status: AC
  Filled 2023-10-30: qty 2

## 2023-10-30 MED ORDER — CEFAZOLIN SODIUM-DEXTROSE 3-4 GM/150ML-% IV SOLN
3.0000 g | INTRAVENOUS | Status: AC
  Administered 2023-10-30: 3 g via INTRAVENOUS
  Filled 2023-10-30: qty 150

## 2023-10-30 MED ORDER — FENTANYL CITRATE PF 50 MCG/ML IJ SOSY: 25.0000 ug | PREFILLED_SYRINGE | INTRAMUSCULAR | Status: DC | PRN

## 2023-10-30 MED ORDER — DEXAMETHASONE SODIUM PHOSPHATE 10 MG/ML IJ SOLN
INTRAMUSCULAR | Status: DC | PRN
  Administered 2023-10-30: 10 mg via INTRAVENOUS

## 2023-10-30 MED ORDER — MIDAZOLAM HCL 2 MG/2ML IJ SOLN
INTRAMUSCULAR | Status: DC | PRN
  Administered 2023-10-30: 2 mg via INTRAVENOUS

## 2023-10-30 MED ORDER — FENTANYL CITRATE PF 50 MCG/ML IJ SOSY
PREFILLED_SYRINGE | INTRAMUSCULAR | Status: AC
  Filled 2023-10-30: qty 1

## 2023-10-30 MED ORDER — FENTANYL CITRATE (PF) 250 MCG/5ML IJ SOLN
INTRAMUSCULAR | Status: DC | PRN
  Administered 2023-10-30: 100 ug via INTRAVENOUS

## 2023-10-30 MED ORDER — SODIUM CHLORIDE 0.9 % IR SOLN
Status: DC | PRN
  Administered 2023-10-30: 6000 mL via INTRAVESICAL

## 2023-10-30 MED ORDER — GLYCOPYRROLATE 0.2 MG/ML IJ SOLN
INTRAMUSCULAR | Status: DC | PRN
  Administered 2023-10-30: .1 mg via INTRAVENOUS

## 2023-10-30 MED ORDER — OXYCODONE HCL 5 MG PO TABS
ORAL_TABLET | ORAL | Status: AC
  Filled 2023-10-30: qty 1

## 2023-10-30 MED ORDER — PROPOFOL 10 MG/ML IV BOLUS
INTRAVENOUS | Status: DC | PRN
  Administered 2023-10-30: 40 mg via INTRAVENOUS
  Administered 2023-10-30: 160 mg via INTRAVENOUS
  Administered 2023-10-30: 50 mg via INTRAVENOUS
  Administered 2023-10-30: 70 mg via INTRAVENOUS

## 2023-10-30 MED ORDER — LACTATED RINGERS IV SOLN: INTRAVENOUS | Status: DC

## 2023-10-30 MED ORDER — OXYCODONE HCL 5 MG/5ML PO SOLN: 5.0000 mg | Freq: Once | ORAL | Status: DC | PRN

## 2023-10-30 MED ORDER — ORAL CARE MOUTH RINSE: 15.0000 mL | Freq: Once | OROMUCOSAL | Status: AC

## 2023-10-30 MED ORDER — OXYCODONE HCL 5 MG PO TABS: 5.0000 mg | ORAL_TABLET | Freq: Once | ORAL | Status: DC | PRN

## 2023-10-30 MED ORDER — ONDANSETRON HCL 4 MG/2ML IJ SOLN
INTRAMUSCULAR | Status: DC | PRN
  Administered 2023-10-30: 4 mg via INTRAVENOUS

## 2023-10-30 SURGICAL SUPPLY — 26 items
BAG URO CATCHER STRL LF (MISCELLANEOUS) ×2 IMPLANT
BASKET ZERO TIP NITINOL 2.4FR (BASKET) IMPLANT
BENZOIN TINCTURE PRP APPL 2/3 (GAUZE/BANDAGES/DRESSINGS) IMPLANT
CATH URETERAL DUAL LUMEN 10F (MISCELLANEOUS) IMPLANT
CATH URETL OPEN 5X70 (CATHETERS) ×2 IMPLANT
CLOTH BEACON ORANGE TIMEOUT ST (SAFETY) ×2 IMPLANT
DRSG TEGADERM 2-3/8X2-3/4 SM (GAUZE/BANDAGES/DRESSINGS) IMPLANT
EXTRACTOR STONE 1.7FRX115CM (UROLOGICAL SUPPLIES) IMPLANT
FIBER LASER MOSES 200 DFL (Laser) IMPLANT
GLOVE BIOGEL M 7.0 STRL (GLOVE) ×2 IMPLANT
GOWN STRL REUS W/ TWL XL LVL3 (GOWN DISPOSABLE) ×2 IMPLANT
GUIDEWIRE STR DUAL SENSOR (WIRE) ×4 IMPLANT
GUIDEWIRE ZIPWRE .038 STRAIGHT (WIRE) IMPLANT
KIT TURNOVER KIT A (KITS) ×2 IMPLANT
MANIFOLD NEPTUNE II (INSTRUMENTS) ×2 IMPLANT
MAT PREVALON FULL STRYKER (MISCELLANEOUS) IMPLANT
NS IRRIG 1000ML POUR BTL (IV SOLUTION) IMPLANT
PACK CYSTO (CUSTOM PROCEDURE TRAY) ×2 IMPLANT
PAD PREP 24X48 CUFFED NSTRL (MISCELLANEOUS) ×2 IMPLANT
SHEATH DILATOR SET 8/10 (MISCELLANEOUS) IMPLANT
SHEATH NAVIGATOR HD 11/13X28 (SHEATH) IMPLANT
SHEATH NAVIGATOR HD 11/13X36 (SHEATH) IMPLANT
STENT URET 6FRX24 CONTOUR (STENTS) IMPLANT
TRACTIP FLEXIVA PULS ID 200XHI (Laser) IMPLANT
TUBING CONNECTING 10 (TUBING) ×2 IMPLANT
TUBING UROLOGY SET (TUBING) ×2 IMPLANT

## 2023-10-30 NOTE — H&P (Signed)
 Urology Preoperative H&P   Chief Complaint: Right ureteral stone  History of Present Illness: [name] is a 63 y.o. female with a right ureteral stone here for right URS/LL, R stent. Denies fevers, chills, dysuria.    Past Medical History:  Diagnosis Date   Abscess of bladder 07/28/2016   Alcohol dependence (HCC)    Allergic rhinitis 12/06/2013   Anxiety    Asthma 03/16/2015   Atrial flutter with rapid ventricular response (HCC) 04/01/2020   COLONIC POLYPS, HX OF 04/05/2010   COVID-19 09/2023   DIVERTICULITIS, HX OF 04/05/2010   DJD (degenerative joint disease)    right knee, mot to severe   Dysrhythmia    Fatty liver    GERD (gastroesophageal reflux disease)    no meds   Heart murmur    hx of    Hemorrhoids    History of kidney stones    Hyperlipidemia    Hypertension    Impaired glucose tolerance 12/06/2013   Morbid obesity with BMI of 50.0-59.9, adult (HCC)    Nausea & vomiting 05/24/2022   Peripheral vascular disease (HCC)    Pulmonary embolism (HCC)     Past Surgical History:  Procedure Laterality Date   ABDOMINAL HYSTERECTOMY  age 35   fibroids   BREAST BIOPSY Left    COLONOSCOPY WITH PROPOFOL  N/A 07/25/2016   Procedure: COLONOSCOPY WITH PROPOFOL ;  Surgeon: Rollin Dover, MD;  [facility];  Service: Endoscopy;  Laterality: N/A;   colonscopy     x 2   CYSTOSCOPY W/ URETERAL STENT PLACEMENT Right 09/05/2021   Procedure: CYSTOSCOPY WITH RETROGRADE PYELOGRAM/URETERAL STENT PLACEMENT;  Surgeon: Selma Donnice SAUNDERS, MD;  [facility];  Service: Urology;  Laterality: Right;   CYSTOSCOPY W/ URETERAL STENT PLACEMENT Right 10/04/2023   Procedure: CYSTOSCOPY, WITH RETROGRADE PYELOGRAM AND URETERAL STENT INSERTION;  Surgeon: Shane Steffan BROCKS, MD;  Location: [facility] OR;  Service: Urology;  Laterality: Right;   CYSTOSCOPY WITH RETROGRADE PYELOGRAM, URETEROSCOPY AND STENT PLACEMENT Left 08/14/2022   Procedure: CYSTOSCOPY WITH RETROGRADE PYELOGRAM;  Surgeon: Selma Donnice SAUNDERS, MD;  Location: [facility] OR;  Service: Urology;  Laterality: Left;   CYSTOSCOPY WITH URETEROSCOPY AND STENT PLACEMENT Left 08/14/2022   Procedure: URETEROSCOPY AND STENT PLACEMENT;  Surgeon: Selma Donnice SAUNDERS, MD;  Location: [facility] OR;  Service: Urology;  Laterality: Left;   CYSTOSCOPY/URETEROSCOPY/HOLMIUM LASER/STENT PLACEMENT Right 09/30/2021   Procedure: CYSTOSCOPY/ RETROGRADE/URETEROSCOPY/HOLMIUM LASER/STENT PLACEMENT;  Surgeon: Selma Donnice SAUNDERS, MD;  [facility];  Service: Urology;  Laterality: Right;   CYSTOSCOPY/URETEROSCOPY/HOLMIUM LASER/STENT PLACEMENT Left 09/12/2022   Procedure: CYSTOSCOPY/LEFT RETROGRADE PYELOGRAM/LEFT URETEROSCOPY/HOLMIUM LASER/LEFT STENT EXCHANGE;  Surgeon: Selma Donnice SAUNDERS, MD;  [facility];  Service: Urology;  Laterality: Left;  75 MINUTES NEEDED FOR CASE   IR RADIOLOGIST EVAL & MGMT  08/12/2016   IR RADIOLOGIST EVAL & MGMT  08/21/2016   KNEE ARTHROSCOPY     left    TOTAL KNEE ARTHROPLASTY  07/29/2011   Procedure: TOTAL KNEE ARTHROPLASTY;  Surgeon: Donnice JONETTA Car, MD;  [facility];  Service: Orthopedics;  Laterality: Right;   TOTAL KNEE ARTHROPLASTY Left 12/14/2012   Procedure: LEFT TOTAL KNEE ARTHROPLASTY;  Surgeon: Donnice JONETTA Car, MD;  Location[facility];  Service: Orthopedics;  Laterality: Left;    Allergies:  Allergies  Allergen Reactions   Morphine  Itching   Shrimp [Shellfish Allergy] Itching and Other (See Comments)    Tongue burns also   Tramadol  Other (See Comments)    Caused confusion   Covid-19 (Mrna)  Vaccine Hives   Diltiazem  Hcl Itching    Pt with itching of the feet when bolus given   Latex Rash    Family History  Problem Relation Age of Onset   Stroke Mother    COPD Father    Lymphoma Sister    Alcoholism Sister     Social History:  reports that she quit smoking about 40 years ago. Her smoking use included cigarettes. She started smoking about 47 years ago. She has a 3.5 pack-year smoking history. She has never been exposed to  tobacco smoke. She has never used smokeless tobacco. She reports current alcohol use of about 1.0 standard drink of alcohol per week. She reports that she does not currently use drugs after having used the following drugs: Marijuana.  ROS: A complete review of systems was performed.  All systems are negative except for pertinent findings as noted.  Physical Exam:  Vital signs in last 24 hours: Temp:  [98.3 F (36.8 C)] 98.3 F (36.8 C) (09/19 1043) Pulse Rate:  [86] 86 (09/19 1043) Resp:  [18] 18 (09/19 1043) BP: (142)/(83) 142/83 (09/19 1043) SpO2:  [95 %] 95 % (09/19 1043) Weight:  [127 kg] 127 kg (09/19 1110) Constitutional:  Alert and oriented, No acute distress Cardiovascular: Regular rate and rhythm Respiratory: Normal respiratory effort, Lungs clear bilaterally GI: Abdomen is soft, nontender, nondistended, no abdominal masses GU: No CVA tenderness Lymphatic: No lymphadenopathy Neurologic: Grossly intact, no focal deficits Psychiatric: Normal mood and affect  Laboratory Data:  No results for input(s): WBC, HGB, HCT, PLT in the last 72 hours.  No results for input(s): NA, K, CL, GLUCOSE, BUN, CALCIUM , CREATININE in the last 72 hours.  Invalid input(s): CO3   No results found for this or any previous visit (from the past 24 hours). No results found for this or any previous visit (from the past 240 hours).  Renal Function: No results for input(s): CREATININE in the last 168 hours. CrCl cannot be calculated (Patient's most recent lab result is older than the maximum 21 days allowed.).  Radiologic Imaging: No results found.  I independently reviewed the above imaging studies.  Assessment and Plan [name] is a 63 y.o. female with a right ureteral stone here for R URS/LL, R stent exchange.  -The risks, benefits and alternatives of cystoscopy with  JJ stent placement was discussed with the patient.  Risks include, but are not limited to:  bleeding, urinary tract infection, ureteral injury, ureteral stricture disease, chronic pain, urinary symptoms, bladder injury, stent migration, the need for nephrostomy tube placement, MI, CVA, DVT, PE and the inherent risks with general anesthesia.  The patient voices understanding and wishes to proceed.       Matt R. Nicolette Gieske MD 10/30/2023, 12:13 PM  [facility] Pager: [phone]): [phone]

## 2023-10-30 NOTE — Anesthesia Postprocedure Evaluation (Signed)
 Anesthesia Post Note  Patient: [name]  Procedure(s) Performed: CYSTOSCOPY/URETEROSCOPY/HOLMIUM LASER/STENT PLACEMENT (Right) CYSTOSCOPY, WITH RETROGRADE PYELOGRAM (Right)     Patient location during evaluation: PACU Anesthesia Type: General Level of consciousness: patient cooperative Pain management: pain level controlled Vital Signs Assessment: post-procedure vital signs reviewed and stable Respiratory status: spontaneous breathing, nonlabored ventilation and respiratory function stable Cardiovascular status: blood pressure returned to baseline and stable Postop Assessment: no apparent nausea or vomiting Anesthetic complications: no   No notable events documented.  Last Vitals:  Vitals:   10/30/23 1451 10/30/23 1500  BP:  (!) 160/95  Pulse: 88 87  Resp:  18  Temp:  (!) 36.4 C  SpO2: 92% 92%    Last Pain:  Vitals:   10/30/23 1500  TempSrc:   PainSc: 3                  Johnpatrick Jenny

## 2023-10-30 NOTE — Anesthesia Preprocedure Evaluation (Addendum)
 Anesthesia Evaluation  Patient identified by MRN, date of birth, ID band Patient awake    Reviewed: Allergy & Precautions, NPO status , Patient's Chart, lab work & pertinent test results  History of Anesthesia Complications Negative for: history of anesthetic complications  Airway Mallampati: IV  TM Distance: >3 FB Neck ROM: Full  Mouth opening: Limited Mouth Opening  Dental  (+) Dental Advisory Given, Teeth Intact   Pulmonary neg shortness of breath, asthma , neg sleep apnea, neg recent URI, former smoker, PE    + wheezing      Cardiovascular hypertension, Pt. on medications and Pt. on home beta blockers (-) angina + Peripheral Vascular Disease and +CHF  (-) Past MI  Rhythm:Regular   1. Left ventricular ejection fraction, by estimation, is 50 to 55%. The  left ventricle has low normal function. The left ventricle has no regional  wall motion abnormalities. There is mild concentric left ventricular  hypertrophy. Left ventricular  diastolic parameters are indeterminate.   2. Right ventricular systolic function is normal. The right ventricular  size is normal. Tricuspid regurgitation signal is inadequate for assessing  PA pressure.   3. Left atrial size was mildly dilated.   4. Right atrial size was mildly dilated.   5. A small pericardial effusion is present. The pericardial effusion is  circumferential.   6. The mitral valve is normal in structure. No evidence of mitral valve  regurgitation. No evidence of mitral stenosis.   7. The aortic valve is grossly normal. Aortic valve regurgitation is not  visualized. No aortic stenosis is present.   8. The inferior vena cava is normal in size with <50% respiratory  variability, suggesting right atrial pressure of 8 mmHg.     Neuro/Psych  PSYCHIATRIC DISORDERS Anxiety Depression    negative neurological ROS     GI/Hepatic Neg liver ROS,GERD  Medicated and Controlled,,  Endo/Other     Class 4 obesity  Renal/GU Renal InsufficiencyRenal diseaseLab Results      Component                Value               Date                      NA                       138                 10/08/2023                K                        4.4                 10/08/2023                CO2                      26                  10/08/2023                GLUCOSE                  122 (H)             10/08/2023  BUN                      28 (H)              10/08/2023                CREATININE               1.19 (H)            10/08/2023                CALCIUM                   9.3                 10/08/2023                GFR                      50.07 (L)           08/06/2023                GFRNONAA                 52 (L)              10/08/2023                Musculoskeletal   Abdominal   Peds  Hematology negative hematology ROS (+) Lab Results      Component                Value               Date                      WBC                      12.2 (H)            10/08/2023                HGB                      12.6                10/08/2023                HCT                      40.1                10/08/2023                MCV                      95.2                10/08/2023                PLT                      235                 10/08/2023              Anesthesia Other Findings   Reproductive/Obstetrics  Anesthesia Physical Anesthesia Plan  ASA: 4  Anesthesia Plan: General   Post-op Pain Management: Minimal or no pain anticipated   Induction: Intravenous, Rapid sequence and Cricoid pressure planned  PONV Risk Score and Plan: 3 and Ondansetron  and Dexamethasone   Airway Management Planned: Oral ETT and Video Laryngoscope Planned  Additional Equipment: None  Intra-op Plan:   Post-operative Plan: Extubation in OR  Informed Consent: I have reviewed the patients History and Physical,  chart, labs and discussed the procedure including the risks, benefits and alternatives for the proposed anesthesia with the patient or authorized representative who has indicated his/her understanding and acceptance.       Plan Discussed with: CRNA  Anesthesia Plan Comments:          Anesthesia Quick Evaluation

## 2023-10-30 NOTE — Op Note (Signed)
 Operative Note  Preoperative diagnosis:  1.  Right ureteral stones  Postoperative diagnosis: 1.  Right ureteral stones  Procedure(s): 1.  Cystoscopy 2. Right ureteroscopy with laser lithotripsy and basket extraction of stones 3. Right retrograde pyelogram 4. Right ureteral stent exchange 5. Fluoroscopy with intraoperative interpretation  Surgeon: Donnice Siad, MD  Assistants:  None  Anesthesia:  General  Complications:  None  EBL:  Minimal  Specimens: 1. Stones for stone analysis (to be done at [facility]) - given to patient  Drains/Catheters: 1.  Right 6Fr x 24cm ureteral stent with a tether string  Intraoperative findings:   Cystoscopy demonstrated likely fistula at the dome of bladder. Right ureteroscopy demonstrated right ureteral stones that were fragmented and all retrieved. Successful right stent exchange.  Indication:  [name] is a 63 y.o. female with a history of right ureteral stones here for definitive therapy.  Description of procedure: After informed consent was obtained from the patient, the patient was identified and taken to the operating room and placed in the supine position.  General anesthesia was administered as well as perioperative IV antibiotics.  At the beginning of the case, a time-out was performed to properly identify the patient, the surgery to be performed, and the surgical site.  Sequential compression devices were applied to the lower extremities at the beginning of the case for DVT prophylaxis.  The patient was then placed in the dorsal lithotomy supine position, prepped and draped in sterile fashion.  We then passed the 21-French rigid cystoscope through the urethra and into the bladder under vision without any difficulty, noting a normal urethra without strictures.  A systematic evaluation of the bladder revealed no evidence of any suspicious bladder lesions.  Ureteral orifices were in normal position.    The distal aspect of  the ureteral stent was seen protruding from the right ureteral orifice.  We then used the alligator-tooth forceps and grasped the distal end of the ureteral stent and brought it out the urethral meatus while watching the proximal coil straighten out nicely on fluoroscopy. Through the ureteral stent, we then passed a 0.038 sensor wire up to the level of the renal pelvis.  The ureteral stent was then removed, leaving the sensor wire up the right ureter.    Under cystoscopic and flouroscopic guidance, we cannulated the right ureteral orifice with a 5-French open-ended ureteral catheter and a gentle retrograde pyelogram was performed, revealing a normal caliber ureter without any filling defects. There was moderate hydronephrosis of the collecting system. A 0.038 sensor wire was then passed up to the level of the renal pelvis and secured to the drape as a safety wire. The ureteral catheter and cystoscope were removed, leaving the safety wire in place.   A semi-rigid ureteroscope was passed alongside the wire up the distal ureter which appeared normal. There we encountered 2 stones, each about 5mm.  Using the 272 micron holmium laser fiber, the stones were fragmented completely. A 2.2 Fr zero tip basket was used to remove the fragments under visual guidance. These were sent for chemical analysis. I advanced the scope up the ureter and encountered no further stones.  We then withdrew the ureteroscope back down the ureter, noting no evidence of any stones along the course of the ureter.  Prior to removing the ureteroscope, we did pass the Glidewire back up to the ureter to the renal pelvis.  Once the ureteroscope was removed, we then used the Glidewire under fluoroscopic guidance and passed up a  6-French x 24 cm double-pigtail ureteral stent up the ureter, making sure that the proximal and distal ends coiled within the kidney and bladder respectively.  Note that we left a long tether string attached to the distal  end of the ureteral stent and it exited the urethral meatus and was tucked into the vagina.  The cystoscope was then advanced back into the bladder under vision.  We were able to see the distal stent coiling nicely within the bladder.  The bladder was then emptied with irrigation solution.  The cystoscope was then removed.    The patient tolerated the procedure well and there was no complication. Patient was awoken from anesthesia and taken to the recovery room in stable condition. I was present and scrubbed for the entirety of the case.  Plan:  Patient will be discharged home.    Matt R. Tamyah Cutbirth MD [facility]  Pager: [phone]

## 2023-10-30 NOTE — Anesthesia Procedure Notes (Signed)
 Procedure Name: Intubation Date/Time: 10/30/2023 12:41 PM  Performed by: Nanci Riis, CRNAPre-anesthesia Checklist: Patient identified, Emergency Drugs available, Suction available and Patient being monitored Patient Re-evaluated:Patient Re-evaluated prior to induction Oxygen Delivery Method: Circle system utilized Preoxygenation: Pre-oxygenation with 100% oxygen Induction Type: IV induction, Rapid sequence and Cricoid Pressure applied Laryngoscope Size: Glidescope and 3 Grade View: Grade I Tube type: Oral Tube size: 7.0 mm Number of attempts: 1 Airway Equipment and Method: Stylet and Video-laryngoscopy Placement Confirmation: ETT inserted through vocal cords under direct vision, positive ETCO2 and breath sounds checked- equal and bilateral Secured at: 21 cm Tube secured with: Tape Dental Injury: Teeth and Oropharynx as per pre-operative assessment

## 2023-10-30 NOTE — Transfer of Care (Signed)
 Immediate Anesthesia Transfer of Care Note  Patient: [name]  Procedure(s) Performed: CYSTOSCOPY/URETEROSCOPY/HOLMIUM LASER/STENT PLACEMENT (Right) CYSTOSCOPY, WITH RETROGRADE PYELOGRAM (Right)  Patient Location: PACU  Anesthesia Type:General  Level of Consciousness: drowsy and patient cooperative  Airway & Oxygen Therapy: Patient Spontanous Breathing and Patient connected to face mask oxygen  Post-op Assessment: Report given to RN and Post -op Vital signs reviewed and stable  Post vital signs: Reviewed and stable  Last Vitals:  Vitals Value Taken Time  BP 125/68 10/30/23 13:53  Temp    Pulse 91 10/30/23 13:54  Resp 24 10/30/23 13:54  SpO2 99 % 10/30/23 13:54  Vitals shown include unfiled device data.  Last Pain:  Vitals:   10/30/23 1110  TempSrc:   PainSc: 8       Patients Stated Pain Goal: 5 (10/30/23 1110)  Complications: No notable events documented.

## 2023-10-30 NOTE — Discharge Instructions (Signed)
 [facility] [phone] Post Ureteroscopy With or Without Stent Instructions  Definitions:  Ureter: The duct that transports urine from the kidney to the bladder. Stent:   A plastic hollow tube that is placed into the ureter, from the kidney to the bladder to prevent the ureter from swelling shut.  GENERAL INSTRUCTIONS:  Despite the fact that no skin incisions were used, the area around the ureter and bladder is raw and irritated. The stent is a foreign body which will further irritate the bladder wall. This irritation is manifested by increased frequency of urination, both day and night, and by an increase in the urge to urinate. In some, the urge to urinate is present almost always. Sometimes the urge is strong enough that you may not be able to stop yourself from urinating. The only real cure is to remove the stent and then give time for the bladder wall to heal which can't be done until the danger of the ureter swelling shut has passed, which varies.  You may see some blood in your urine while the stent is in place and a few days afterwards. Do not be alarmed, even if the urine was clear for a while. Get off your feet and drink lots of fluids until clearing occurs. If you start to pass clots or don't improve, call us .  DIET: You may return to your normal diet immediately. Because of the raw surface of your bladder, alcohol, spicy foods, acid type foods and drinks with caffeine  may cause irritation or frequency and should be used in moderation. To keep your urine flowing freely and to avoid constipation, drink plenty of fluids during the day ( 8-10 glasses ). Tip: Avoid cranberry juice because it is very acidic.  ACTIVITY: Your physical activity doesn't need to be restricted. However, if you are very active, you may see some blood in your urine. We suggest that you reduce your activity under these circumstances until the bleeding has stopped.  BOWELS: It is important to  keep your bowels regular during the postoperative period. Straining with bowel movements can cause bleeding. A bowel movement every other day is reasonable. Use a mild laxative if needed, such as Milk of Magnesia 2-3 tablespoons, or 2 Dulcolax tablets. Call if you continue to have problems. If you have been taking narcotics for pain, before, during or after your surgery, you may be constipated. Take a laxative if necessary.   MEDICATION: You should resume your pre-surgery medications unless told not to. In addition you will often be given an antibiotic to prevent infection. These should be taken as prescribed until the bottles are finished unless you are having an unusual reaction to one of the drugs.  PROBLEMS YOU SHOULD REPORT TO US : Fevers over 100.5 Fahrenheit. Heavy bleeding, or clots ( See above notes about blood in urine ). Inability to urinate. Drug reactions ( hives, rash, nausea, vomiting, diarrhea ). Severe burning or pain with urination that is not improving.  FOLLOW-UP: You will need a follow-up appointment to monitor your progress. Call for this appointment at the number listed above. Usually the first appointment will be about three to fourteen days after your surgery.  You may remove stent by pulling on attached string tucked in vagina on Monday AM.

## 2023-10-31 ENCOUNTER — Encounter (HOSPITAL_COMMUNITY): Payer: Self-pay | Admitting: Urology

## 2023-11-15 ENCOUNTER — Emergency Department (HOSPITAL_COMMUNITY)

## 2023-11-15 ENCOUNTER — Emergency Department (HOSPITAL_COMMUNITY): Admitting: Certified Registered Nurse Anesthetist

## 2023-11-15 ENCOUNTER — Inpatient Hospital Stay (HOSPITAL_COMMUNITY)
Admission: EM | Admit: 2023-11-15 | Discharge: 2023-11-19 | DRG: 854 | Disposition: A | Discharge status: Home / Self Care | Attending: Internal Medicine | Admitting: Internal Medicine

## 2023-11-15 ENCOUNTER — Encounter (HOSPITAL_COMMUNITY): Payer: Self-pay | Admitting: *Deleted

## 2023-11-15 ENCOUNTER — Encounter (HOSPITAL_COMMUNITY)
Admission: EM | Disposition: A | Discharge status: Home Health | Payer: Self-pay | Source: Home / Self Care | Attending: Internal Medicine

## 2023-11-15 ENCOUNTER — Other Ambulatory Visit: Payer: Self-pay

## 2023-11-15 DIAGNOSIS — N136 Pyonephrosis: Secondary | ICD-10-CM | POA: Diagnosis not present

## 2023-11-15 DIAGNOSIS — A419 Sepsis, unspecified organism: Principal | ICD-10-CM

## 2023-11-15 DIAGNOSIS — N201 Calculus of ureter: Secondary | ICD-10-CM | POA: Diagnosis not present

## 2023-11-15 DIAGNOSIS — I739 Peripheral vascular disease, unspecified: Secondary | ICD-10-CM | POA: Diagnosis not present

## 2023-11-15 DIAGNOSIS — Z90710 Acquired absence of both cervix and uterus: Secondary | ICD-10-CM

## 2023-11-15 DIAGNOSIS — D1803 Hemangioma of intra-abdominal structures: Secondary | ICD-10-CM | POA: Diagnosis not present

## 2023-11-15 DIAGNOSIS — J45909 Unspecified asthma, uncomplicated: Secondary | ICD-10-CM | POA: Diagnosis not present

## 2023-11-15 DIAGNOSIS — N132 Hydronephrosis with renal and ureteral calculous obstruction: Secondary | ICD-10-CM | POA: Diagnosis not present

## 2023-11-15 DIAGNOSIS — Z6372 Alcoholism and drug addiction in family: Secondary | ICD-10-CM

## 2023-11-15 DIAGNOSIS — Z91040 Latex allergy status: Secondary | ICD-10-CM | POA: Diagnosis not present

## 2023-11-15 DIAGNOSIS — Z7901 Long term (current) use of anticoagulants: Secondary | ICD-10-CM | POA: Diagnosis not present

## 2023-11-15 DIAGNOSIS — B9620 Unspecified Escherichia coli [E. coli] as the cause of diseases classified elsewhere: Secondary | ICD-10-CM | POA: Diagnosis present

## 2023-11-15 DIAGNOSIS — N202 Calculus of kidney with calculus of ureter: Secondary | ICD-10-CM | POA: Diagnosis present

## 2023-11-15 DIAGNOSIS — K5730 Diverticulosis of large intestine without perforation or abscess without bleeding: Secondary | ICD-10-CM | POA: Diagnosis not present

## 2023-11-15 DIAGNOSIS — Z887 Allergy status to serum and vaccine status: Secondary | ICD-10-CM

## 2023-11-15 DIAGNOSIS — Z1612 Extended spectrum beta lactamase (ESBL) resistance: Secondary | ICD-10-CM | POA: Diagnosis present

## 2023-11-15 DIAGNOSIS — Z6841 Body Mass Index (BMI) 40.0 and over, adult: Secondary | ICD-10-CM

## 2023-11-15 DIAGNOSIS — B9629 Other Escherichia coli [E. coli] as the cause of diseases classified elsewhere: Secondary | ICD-10-CM | POA: Diagnosis not present

## 2023-11-15 DIAGNOSIS — A4159 Other Gram-negative sepsis: Principal | ICD-10-CM | POA: Diagnosis present

## 2023-11-15 DIAGNOSIS — A4150 Gram-negative sepsis, unspecified: Secondary | ICD-10-CM | POA: Diagnosis not present

## 2023-11-15 DIAGNOSIS — Z825 Family history of asthma and other chronic lower respiratory diseases: Secondary | ICD-10-CM

## 2023-11-15 DIAGNOSIS — Z823 Family history of stroke: Secondary | ICD-10-CM

## 2023-11-15 DIAGNOSIS — Z86711 Personal history of pulmonary embolism: Secondary | ICD-10-CM

## 2023-11-15 DIAGNOSIS — Z860100 Personal history of colon polyps, unspecified: Secondary | ICD-10-CM

## 2023-11-15 DIAGNOSIS — N179 Acute kidney failure, unspecified: Secondary | ICD-10-CM | POA: Diagnosis present

## 2023-11-15 DIAGNOSIS — Z807 Family history of other malignant neoplasms of lymphoid, hematopoietic and related tissues: Secondary | ICD-10-CM

## 2023-11-15 DIAGNOSIS — Z811 Family history of alcohol abuse and dependence: Secondary | ICD-10-CM

## 2023-11-15 DIAGNOSIS — R2681 Unsteadiness on feet: Secondary | ICD-10-CM | POA: Diagnosis not present

## 2023-11-15 DIAGNOSIS — R000 Tachycardia, unspecified: Secondary | ICD-10-CM | POA: Diagnosis not present

## 2023-11-15 DIAGNOSIS — N200 Calculus of kidney: Secondary | ICD-10-CM | POA: Diagnosis not present

## 2023-11-15 DIAGNOSIS — Z87891 Personal history of nicotine dependence: Secondary | ICD-10-CM | POA: Diagnosis not present

## 2023-11-15 DIAGNOSIS — I10 Essential (primary) hypertension: Secondary | ICD-10-CM | POA: Diagnosis not present

## 2023-11-15 DIAGNOSIS — M48061 Spinal stenosis, lumbar region without neurogenic claudication: Secondary | ICD-10-CM | POA: Diagnosis not present

## 2023-11-15 DIAGNOSIS — E860 Dehydration: Secondary | ICD-10-CM | POA: Diagnosis not present

## 2023-11-15 DIAGNOSIS — N390 Urinary tract infection, site not specified: Secondary | ICD-10-CM

## 2023-11-15 DIAGNOSIS — M51369 Other intervertebral disc degeneration, lumbar region without mention of lumbar back pain or lower extremity pain: Secondary | ICD-10-CM | POA: Diagnosis not present

## 2023-11-15 DIAGNOSIS — R6520 Severe sepsis without septic shock: Secondary | ICD-10-CM | POA: Diagnosis present

## 2023-11-15 DIAGNOSIS — Z452 Encounter for adjustment and management of vascular access device: Secondary | ICD-10-CM | POA: Diagnosis not present

## 2023-11-15 DIAGNOSIS — Z885 Allergy status to narcotic agent status: Secondary | ICD-10-CM | POA: Diagnosis not present

## 2023-11-15 DIAGNOSIS — B961 Klebsiella pneumoniae [K. pneumoniae] as the cause of diseases classified elsewhere: Secondary | ICD-10-CM | POA: Diagnosis not present

## 2023-11-15 DIAGNOSIS — Z8616 Personal history of COVID-19: Secondary | ICD-10-CM

## 2023-11-15 DIAGNOSIS — Z7951 Long term (current) use of inhaled steroids: Secondary | ICD-10-CM

## 2023-11-15 DIAGNOSIS — F418 Other specified anxiety disorders: Secondary | ICD-10-CM | POA: Diagnosis not present

## 2023-11-15 DIAGNOSIS — E785 Hyperlipidemia, unspecified: Secondary | ICD-10-CM | POA: Diagnosis present

## 2023-11-15 DIAGNOSIS — D72829 Elevated white blood cell count, unspecified: Secondary | ICD-10-CM | POA: Diagnosis not present

## 2023-11-15 DIAGNOSIS — N1330 Unspecified hydronephrosis: Secondary | ICD-10-CM | POA: Diagnosis present

## 2023-11-15 DIAGNOSIS — K760 Fatty (change of) liver, not elsewhere classified: Secondary | ICD-10-CM | POA: Diagnosis present

## 2023-11-15 DIAGNOSIS — M6281 Muscle weakness (generalized): Secondary | ICD-10-CM | POA: Diagnosis not present

## 2023-11-15 DIAGNOSIS — Z91013 Allergy to seafood: Secondary | ICD-10-CM | POA: Diagnosis not present

## 2023-11-15 DIAGNOSIS — M549 Dorsalgia, unspecified: Secondary | ICD-10-CM | POA: Diagnosis not present

## 2023-11-15 DIAGNOSIS — Z888 Allergy status to other drugs, medicaments and biological substances status: Secondary | ICD-10-CM

## 2023-11-15 DIAGNOSIS — T82898A Other specified complication of vascular prosthetic devices, implants and grafts, initial encounter: Secondary | ICD-10-CM | POA: Diagnosis not present

## 2023-11-15 DIAGNOSIS — I480 Paroxysmal atrial fibrillation: Secondary | ICD-10-CM | POA: Diagnosis not present

## 2023-11-15 DIAGNOSIS — R109 Unspecified abdominal pain: Secondary | ICD-10-CM | POA: Diagnosis not present

## 2023-11-15 DIAGNOSIS — Z79899 Other long term (current) drug therapy: Secondary | ICD-10-CM | POA: Diagnosis not present

## 2023-11-15 DIAGNOSIS — Z960 Presence of urogenital implants: Secondary | ICD-10-CM | POA: Diagnosis not present

## 2023-11-15 DIAGNOSIS — Z96652 Presence of left artificial knee joint: Secondary | ICD-10-CM | POA: Diagnosis present

## 2023-11-15 DIAGNOSIS — R7881 Bacteremia: Secondary | ICD-10-CM | POA: Diagnosis not present

## 2023-11-15 HISTORY — PX: CYSTOSCOPY W/ URETERAL STENT PLACEMENT: SHX1429

## 2023-11-15 LAB — COMPREHENSIVE METABOLIC PANEL WITH GFR
ALT: 33 U/L (ref 0–44)
AST: 41 U/L (ref 15–41)
Albumin: 3 g/dL — ABNORMAL LOW (ref 3.5–5.0)
Alkaline Phosphatase: 113 U/L (ref 38–126)
Anion gap: 16 — ABNORMAL HIGH (ref 5–15)
BUN: 24 mg/dL — ABNORMAL HIGH (ref 8–23)
CO2: 20 mmol/L — ABNORMAL LOW (ref 22–32)
Calcium: 8.8 mg/dL — ABNORMAL LOW (ref 8.9–10.3)
Chloride: 102 mmol/L (ref 98–111)
Creatinine, Ser: 2.04 mg/dL — ABNORMAL HIGH (ref 0.44–1.00)
GFR, Estimated: 27 mL/min — ABNORMAL LOW (ref >60)
Glucose, Bld: 122 mg/dL — ABNORMAL HIGH (ref 70–99)
Potassium: 3.9 mmol/L (ref 3.5–5.1)
Sodium: 138 mmol/L (ref 135–145)
Total Bilirubin: 0.8 mg/dL (ref 0.0–1.2)
Total Protein: 7.4 g/dL (ref 6.5–8.1)

## 2023-11-15 LAB — CBC
HCT: 41.1 % (ref 36.0–46.0)
Hemoglobin: 13.3 g/dL (ref 12.0–15.0)
MCH: 30.3 pg (ref 26.0–34.0)
MCHC: 32.4 g/dL (ref 30.0–36.0)
MCV: 93.6 fL (ref 80.0–100.0)
Platelets: 255 K/uL (ref 150–400)
RBC: 4.39 MIL/uL (ref 3.87–5.11)
RDW: 13.9 % (ref 11.5–15.5)
WBC: 25.5 K/uL — ABNORMAL HIGH (ref 4.0–10.5)
nRBC: 0.1 % (ref 0.0–0.2)

## 2023-11-15 LAB — URINALYSIS, MICROSCOPIC (REFLEX)

## 2023-11-15 LAB — LIPASE, BLOOD: Lipase: 15 U/L (ref 11–51)

## 2023-11-15 LAB — URINALYSIS, ROUTINE W REFLEX MICROSCOPIC
Bilirubin Urine: NEGATIVE
Glucose, UA: NEGATIVE mg/dL
Ketones, ur: NEGATIVE mg/dL
Nitrite: POSITIVE — AB
Protein, ur: 30 mg/dL — AB
Specific Gravity, Urine: >1.03 — ABNORMAL HIGH (ref 1.005–1.030)
pH: 6 (ref 5.0–8.0)

## 2023-11-15 LAB — HIV ANTIBODY (ROUTINE TESTING W REFLEX): HIV Screen 4th Generation wRfx: NONREACTIVE

## 2023-11-15 SURGERY — CYSTOSCOPY, WITH RETROGRADE PYELOGRAM AND URETERAL STENT INSERTION
Anesthesia: General | Laterality: Right

## 2023-11-15 MED ORDER — LACTATED RINGERS IV SOLN: INTRAVENOUS | Status: AC

## 2023-11-15 MED ORDER — HYDROMORPHONE HCL 1 MG/ML IJ SOLN
1.0000 mg | INTRAMUSCULAR | Status: DC | PRN
  Filled 2023-11-15: qty 1

## 2023-11-15 MED ORDER — IOHEXOL 300 MG/ML  SOLN
INTRAMUSCULAR | Status: DC | PRN
  Administered 2023-11-15: 16 mL via URETHRAL

## 2023-11-15 MED ORDER — OXYCODONE-ACETAMINOPHEN 5-325 MG PO TABS
1.0000 | ORAL_TABLET | ORAL | Status: DC | PRN
  Administered 2023-11-15 – 2023-11-19 (×11): 1 via ORAL
  Filled 2023-11-15 (×12): qty 1

## 2023-11-15 MED ORDER — LOSARTAN POTASSIUM-HCTZ 100-25 MG PO TABS: 1.0000 | ORAL_TABLET | Freq: Every day | ORAL | Status: DC

## 2023-11-15 MED ORDER — PHENYLEPHRINE 80 MCG/ML (10ML) SYRINGE FOR IV PUSH (FOR BLOOD PRESSURE SUPPORT)
PREFILLED_SYRINGE | INTRAVENOUS | Status: AC
  Filled 2023-11-15: qty 10

## 2023-11-15 MED ORDER — SODIUM CHLORIDE 0.9 % IR SOLN
Status: DC | PRN
  Administered 2023-11-15: 3000 mL

## 2023-11-15 MED ORDER — SODIUM CHLORIDE 0.9 % IV SOLN
1.0000 g | INTRAVENOUS | Status: DC
  Administered 2023-11-16: 1 g via INTRAVENOUS
  Filled 2023-11-15: qty 10

## 2023-11-15 MED ORDER — ONDANSETRON HCL 4 MG/2ML IJ SOLN
4.0000 mg | Freq: Once | INTRAMUSCULAR | Status: AC
  Administered 2023-11-15: 4 mg via INTRAVENOUS
  Filled 2023-11-15: qty 2

## 2023-11-15 MED ORDER — LIDOCAINE 2% (20 MG/ML) 5 ML SYRINGE
INTRAMUSCULAR | Status: AC
  Filled 2023-11-15: qty 5

## 2023-11-15 MED ORDER — ONDANSETRON HCL 4 MG/2ML IJ SOLN
INTRAMUSCULAR | Status: AC
  Filled 2023-11-15: qty 2

## 2023-11-15 MED ORDER — CHLORHEXIDINE GLUCONATE 0.12 % MT SOLN
15.0000 mL | Freq: Once | OROMUCOSAL | Status: AC
  Administered 2023-11-15: 15 mL via OROMUCOSAL

## 2023-11-15 MED ORDER — SUGAMMADEX SODIUM 200 MG/2ML IV SOLN
INTRAVENOUS | Status: DC | PRN
  Administered 2023-11-15: 400 mg via INTRAVENOUS

## 2023-11-15 MED ORDER — SUCCINYLCHOLINE CHLORIDE 200 MG/10ML IV SOSY
PREFILLED_SYRINGE | INTRAVENOUS | Status: DC | PRN
  Administered 2023-11-15: 140 mg via INTRAVENOUS

## 2023-11-15 MED ORDER — FENTANYL CITRATE (PF) 100 MCG/2ML IJ SOLN: 25.0000 ug | INTRAMUSCULAR | Status: DC | PRN

## 2023-11-15 MED ORDER — PHENYLEPHRINE HCL-NACL 20-0.9 MG/250ML-% IV SOLN
INTRAVENOUS | Status: DC | PRN
  Administered 2023-11-15: 50 ug/min via INTRAVENOUS

## 2023-11-15 MED ORDER — RIVAROXABAN 20 MG PO TABS
20.0000 mg | ORAL_TABLET | Freq: Every day | ORAL | Status: DC
  Administered 2023-11-15 – 2023-11-18 (×4): 20 mg via ORAL
  Filled 2023-11-15 (×4): qty 1

## 2023-11-15 MED ORDER — ONDANSETRON HCL 4 MG/2ML IJ SOLN
4.0000 mg | Freq: Four times a day (QID) | INTRAMUSCULAR | Status: AC | PRN
  Administered 2023-11-15: 4 mg via INTRAVENOUS

## 2023-11-15 MED ORDER — ORAL CARE MOUTH RINSE: 15.0000 mL | Freq: Once | OROMUCOSAL | Status: AC

## 2023-11-15 MED ORDER — LIDOCAINE 2% (20 MG/ML) 5 ML SYRINGE
INTRAMUSCULAR | Status: DC | PRN
  Administered 2023-11-15: 60 mg via INTRAVENOUS

## 2023-11-15 MED ORDER — SODIUM CHLORIDE 0.9 % IV SOLN: INTRAVENOUS | Status: DC | PRN

## 2023-11-15 MED ORDER — ATORVASTATIN CALCIUM 10 MG PO TABS
20.0000 mg | ORAL_TABLET | Freq: Every day | ORAL | Status: DC
  Administered 2023-11-15 – 2023-11-19 (×5): 20 mg via ORAL
  Filled 2023-11-15 (×6): qty 2

## 2023-11-15 MED ORDER — VASOPRESSIN 20 UNIT/ML IV SOLN
INTRAVENOUS | Status: AC
  Filled 2023-11-15: qty 1

## 2023-11-15 MED ORDER — MIDAZOLAM HCL 2 MG/2ML IJ SOLN
INTRAMUSCULAR | Status: AC
  Filled 2023-11-15: qty 2

## 2023-11-15 MED ORDER — PHENYLEPHRINE 80 MCG/ML (10ML) SYRINGE FOR IV PUSH (FOR BLOOD PRESSURE SUPPORT)
PREFILLED_SYRINGE | INTRAVENOUS | Status: DC | PRN
  Administered 2023-11-15 (×4): 160 ug via INTRAVENOUS

## 2023-11-15 MED ORDER — PROPOFOL 10 MG/ML IV BOLUS
INTRAVENOUS | Status: DC | PRN
Start: 2023-11-15 — End: 2023-11-15
  Administered 2023-11-15: 180 mg via INTRAVENOUS

## 2023-11-15 MED ORDER — SODIUM CHLORIDE 0.9 % IV SOLN: INTRAVENOUS | Status: DC

## 2023-11-15 MED ORDER — DEXAMETHASONE SODIUM PHOSPHATE 10 MG/ML IJ SOLN
INTRAMUSCULAR | Status: DC | PRN
  Administered 2023-11-15: 10 mg via INTRAVENOUS

## 2023-11-15 MED ORDER — OXYCODONE HCL 5 MG PO TABS: 5.0000 mg | ORAL_TABLET | Freq: Once | ORAL | Status: DC | PRN

## 2023-11-15 MED ORDER — DEXMEDETOMIDINE HCL IN NACL 80 MCG/20ML IV SOLN
INTRAVENOUS | Status: DC | PRN
  Administered 2023-11-15 (×2): 10 ug via INTRAVENOUS

## 2023-11-15 MED ORDER — PROPOFOL 10 MG/ML IV BOLUS
INTRAVENOUS | Status: AC
  Filled 2023-11-15: qty 20

## 2023-11-15 MED ORDER — ONDANSETRON HCL 4 MG/2ML IJ SOLN
INTRAMUSCULAR | Status: DC | PRN
  Administered 2023-11-15: 4 mg via INTRAVENOUS

## 2023-11-15 MED ORDER — METOPROLOL SUCCINATE ER 25 MG PO TB24
25.0000 mg | ORAL_TABLET | Freq: Every day | ORAL | Status: DC
  Administered 2023-11-15 – 2023-11-19 (×5): 25 mg via ORAL
  Filled 2023-11-15 (×5): qty 1

## 2023-11-15 MED ORDER — HYDROMORPHONE HCL 1 MG/ML IJ SOLN
1.0000 mg | Freq: Once | INTRAMUSCULAR | Status: AC
  Administered 2023-11-15: 1 mg via INTRAVENOUS
  Filled 2023-11-15: qty 1

## 2023-11-15 MED ORDER — VASOPRESSIN 20 UNIT/ML IV SOLN
INTRAVENOUS | Status: DC | PRN
  Administered 2023-11-15: 1 [IU] via INTRAVENOUS

## 2023-11-15 MED ORDER — ROCURONIUM BROMIDE 10 MG/ML (PF) SYRINGE
PREFILLED_SYRINGE | INTRAVENOUS | Status: DC | PRN
  Administered 2023-11-15: 30 mg via INTRAVENOUS

## 2023-11-15 MED ORDER — LORAZEPAM 0.5 MG PO TABS
0.5000 mg | ORAL_TABLET | Freq: Two times a day (BID) | ORAL | Status: DC | PRN
  Administered 2023-11-15 – 2023-11-19 (×7): 0.5 mg via ORAL
  Filled 2023-11-15 (×8): qty 1

## 2023-11-15 MED ORDER — DEXAMETHASONE SODIUM PHOSPHATE 10 MG/ML IJ SOLN
INTRAMUSCULAR | Status: AC
  Filled 2023-11-15: qty 1

## 2023-11-15 MED ORDER — LACTATED RINGERS IV SOLN
INTRAVENOUS | Status: DC
Start: 2023-11-15 — End: 2023-11-15

## 2023-11-15 MED ORDER — MIDAZOLAM HCL 2 MG/2ML IJ SOLN
INTRAMUSCULAR | Status: DC | PRN
Start: 2023-11-15 — End: 2023-11-15
  Administered 2023-11-15: 2 mg via INTRAVENOUS

## 2023-11-15 MED ORDER — CHLORHEXIDINE GLUCONATE 0.12 % MT SOLN
OROMUCOSAL | Status: AC
  Filled 2023-11-15: qty 15

## 2023-11-15 MED ORDER — FENTANYL CITRATE (PF) 250 MCG/5ML IJ SOLN
INTRAMUSCULAR | Status: DC | PRN
  Administered 2023-11-15: 100 ug via INTRAVENOUS

## 2023-11-15 MED ORDER — ORAL CARE MOUTH RINSE: 15.0000 mL | OROMUCOSAL | Status: DC | PRN

## 2023-11-15 MED ORDER — OXYCODONE HCL 5 MG/5ML PO SOLN: 5.0000 mg | Freq: Once | ORAL | Status: DC | PRN

## 2023-11-15 MED ORDER — SODIUM CHLORIDE 0.9 % IV SOLN
2.0000 g | Freq: Once | INTRAVENOUS | Status: AC
  Administered 2023-11-15: 2 g via INTRAVENOUS
  Filled 2023-11-15: qty 20

## 2023-11-15 MED ORDER — FENTANYL CITRATE (PF) 250 MCG/5ML IJ SOLN
INTRAMUSCULAR | Status: AC
  Filled 2023-11-15: qty 5

## 2023-11-15 MED ORDER — LACTATED RINGERS IV BOLUS
1000.0000 mL | Freq: Once | INTRAVENOUS | Status: AC
  Administered 2023-11-15: 1000 mL via INTRAVENOUS

## 2023-11-15 MED ORDER — ENOXAPARIN SODIUM 40 MG/0.4ML IJ SOSY: 40.0000 mg | PREFILLED_SYRINGE | INTRAMUSCULAR | Status: DC

## 2023-11-15 SURGICAL SUPPLY — 18 items
BAG DRAIN URO-CYSTO SKYTR STRL (DRAIN) ×2 IMPLANT
BAG URINE DRAIN 2000ML AR STRL (UROLOGICAL SUPPLIES) ×2 IMPLANT
CATH URET 5FR 28IN OPEN ENDED (CATHETERS) IMPLANT
CATH URETL OPEN END 6FR 70 (CATHETERS) IMPLANT
CATH URTH STD 16FR FL 2W DRN (CATHETERS) ×2 IMPLANT
GLOVE BIOGEL M 7.0 STRL (GLOVE) ×2 IMPLANT
GOWN STRL REUS W/TWL LRG LVL3 (GOWN DISPOSABLE) ×2 IMPLANT
GUIDEWIRE ANG ZIPWIRE 038X150 (WIRE) IMPLANT
GUIDEWIRE STR DUAL SENSOR (WIRE) ×4 IMPLANT
IV 0.9% NACL 1000 ML (IV SOLUTION) ×2 IMPLANT
IV CATH 14GX2 1/4 (CATHETERS) IMPLANT
KIT TURNOVER KIT B (KITS) IMPLANT
MANIFOLD NEPTUNE II (INSTRUMENTS) ×2 IMPLANT
PACK CYSTO (CUSTOM PROCEDURE TRAY) ×2 IMPLANT
STENT CONTOUR 6FRX24X.038 (STENTS) IMPLANT
STENT CONTOUR 6FRX26X.038 (STENTS) ×2 IMPLANT
STENT URET 6FRX24 CONTOUR (STENTS) IMPLANT
TUBE CONNECTING 12X1/4 (SUCTIONS) ×2 IMPLANT

## 2023-11-15 NOTE — ED Notes (Signed)
 Patient is very uncomfortable on stretcher , placed in a recliner for comfort.

## 2023-11-15 NOTE — Plan of Care (Signed)
   Problem: Education: Goal: Knowledge of General Education information will improve Description: Including pain rating scale, medication(s)/side effects and non-pharmacologic comfort measures Outcome: Completed/Met

## 2023-11-15 NOTE — Op Note (Signed)
 Operative Note  Preoperative diagnosis:  1.  Right UPJ stone with obstruction and infection 2.  Nonobstructing left renal stones  Postoperative diagnosis: 1.  Same  Procedure(s): 1.  Cystoscopy 2.  Bilateral retrograde pyelogram with interpretation 3.  Bilateral ureteral stent placement 4. Fluoroscopy <1 hour with intraoperative interpretation  Surgeon: Susan Siad, MD  Assistants:  None  Anesthesia:  General  Complications:  None  EBL: Minimal  Specimens: 1.  ID Type Source Tests Collected by Time Destination  A : Right renal pelvis culture Body Fluid Path fluid AEROBIC/ANAEROBIC CULTURE W GRAM STAIN (SURGICAL/DEEP WOUND) [name] Susan SAUNDERS, MD 11/15/2023 1254     Drains/Catheters: 1.  Right 6Fr x 24cm ureteral stent 2.  Left 6 French by 24 cm ureteral stent  Intraoperative findings:   Cystoscopy demonstrated likely known fistula at the dome of her bladder Right retrograde pyelogram demonstrated moderate right hydronephrosis, no extravasation of contrast, no filling defects Left retrograde pyelogram demonstrated no hydronephrosis Successful bilateral ureteral stent placement with curl in the renal pelvis and bladder respectively.  Indication:  [name] is a 63 y.o. female with history of urolithiasis and most recently right UPJ stone with sign of obstruction.  She also has nonobstructing left renal stones.  She would like to undergo definitive management of bilateral stones ultimately.  After reviewing the management options for treatment, she elected to proceed with the above surgical procedure(s). We have discussed the potential benefits and risks of the procedure, side effects of the proposed treatment, the likelihood of the patient achieving the goals of the procedure, and any potential problems that might occur during the procedure or recuperation. Informed consent has been obtained.  Description of procedure: The patient was taken to the operating room and general  anesthesia was induced.  The patient was placed in the dorsal lithotomy position, prepped and draped in the usual sterile fashion, and preoperative antibiotics were administered. A preoperative time-out was performed.   Cystourethroscopy was performed.  The patient's urethra was examined and was normal. The bladder was then systematically examined in its entirety.  She has likely known fistula at the dome of her bladder.  Attention then turned to the right ureteral orifice. A 0.038 zip wire was passed through the right orifice and over the wire a 5 Fr open ended catheter was inserted and passed up to the level of the renal pelvis. Aspirate was obtained and sent off as right renal pelvis urine for culture. Omnipaque  contrast was injected through the ureteral catheter and a retrograde pyelogram was performed with findings as dictated above. The wire was then replaced and the open ended catheter was removed.   A 6Fr x 24 cm ureteral stent was advance over the wire. The stent was positioned appropriately under fluoroscopic and cystoscopic guidance.  The wire was then removed with an adequate stent curl noted in the renal pelvis as well as in the bladder.  Attention then turned to the left ureteral orifice. A 0.038 zip wire was passed through the right orifice and over the wire a 5 Fr open ended catheter was inserted and passed up to the level of the renal pelvis. Omnipaque  contrast was injected through the ureteral catheter and a retrograde pyelogram was performed with findings as dictated above. The wire was then replaced and the open ended catheter was removed.   A 6Fr x 24 cm ureteral stent was advance over the wire. The stent was positioned appropriately under fluoroscopic and cystoscopic guidance.  The wire was  then removed with an adequate stent curl noted in the renal pelvis as well as in the bladder.  The bladder was then emptied and the procedure ended.  The patient appeared to tolerate the  procedure well and without complications.  The patient was able to be awakened and transferred to the recovery unit in satisfactory condition.   Plan: Continue IV antibiotics.  Follow-up right renal pelvis urine cultures.  Message follow-up to arrange for bilateral ureteroscopy.  Discussed findings with her husband.  Susan R. Audrina Marten MD [facility]  Pager: [phone]

## 2023-11-15 NOTE — ED Provider Notes (Signed)
 Spoke with Dr. Selma with urology service and he will plan on taking her for stent later today.  Patient is n.p.o. at this time.   Doretha Folks, MD 11/15/23 [phone]

## 2023-11-15 NOTE — ED Notes (Signed)
Helped pt to the bathroom to void 

## 2023-11-15 NOTE — Plan of Care (Signed)

## 2023-11-15 NOTE — Anesthesia Procedure Notes (Signed)
 Procedure Name: Intubation Date/Time: 11/15/2023 12:36 PM  Performed by: Arvell Edsel HERO, CRNAPre-anesthesia Checklist: Patient identified, Emergency Drugs available, Suction available, Patient being monitored and Timeout performed Patient Re-evaluated:Patient Re-evaluated prior to induction Oxygen Delivery Method: Circle system utilized Preoxygenation: Pre-oxygenation with 100% oxygen Induction Type: IV induction, Rapid sequence and Cricoid Pressure applied Laryngoscope Size: Glidescope and 3 Grade View: Grade I Tube size: 7.0 mm Number of attempts: 1 Airway Equipment and Method: Patient positioned with wedge pillow and Video-laryngoscopy Placement Confirmation: ETT inserted through vocal cords under direct vision, positive ETCO2, CO2 detector and breath sounds checked- equal and bilateral Secured at: 23 cm Tube secured with: Tape

## 2023-11-15 NOTE — ED Triage Notes (Addendum)
 Pt states that she has abdominal pain which is generalized and feels sore which began after midnight. Pt also has lower back pain which has been intermittent for a month or two and feels sore but this has not been associated with any trauma or injury.  Pt would like her right leg checked as it has been hurting for over a month and it feels like a pulled muscle.  Pt also notes that her tongue is white and she would like this evaluated, she first noted this yesterday.  Pt has hx of kidney stones and recently had a lithotripsy.

## 2023-11-15 NOTE — ED Provider Notes (Signed)
 [place] EMERGENCY DEPARTMENT AT [facility] Provider Note   CSN: [id number] Arrival date & time: 11/15/23  9649     Patient presents with: Abdominal Pain   [name] is a 63 y.o. female.   Presents to the emergency department for evaluation of multiple problems.  Patient reports that she has been having back pain radiating into the right leg for a month.  She also now is experiencing lower abdominal pain.  She would also like her tongue looked at because it has been white for a while.       Prior to Admission medications   Medication Sig Start Date End Date Taking? Authorizing Provider  albuterol  (VENTOLIN  HFA) 108 (90 Base) MCG/ACT inhaler INHALE 2 PUFFS INTO THE LUNGS EVERY 6 HOURS AS NEEDED 08/06/23   Norleen Lynwood ORN, MD  atorvastatin  (LIPITOR) 20 MG tablet Take 1 tablet (20 mg total) by mouth daily. 08/06/23   Norleen Lynwood ORN, MD  calcium  carbonate (TUMS - DOSED IN MG ELEMENTAL CALCIUM ) 500 MG chewable tablet Chew 1 tablet by mouth daily as needed for indigestion or heartburn.    [provider]  Carboxymethylcellul-Glycerin (CLEAR EYES FOR DRY EYES OP) Place 1 drop into both eyes daily as needed (Dry eyes).    [provider]  docusate sodium  (COLACE) 100 MG capsule Take 1 capsule (100 mg total) by mouth daily as needed for up to 30 doses. 10/30/23   Selma Donnice SAUNDERS, MD  famotidine  (PEPCID ) 20 MG tablet Take 1 tablet (20 mg total) by mouth 2 (two) times daily. 08/06/23   Norleen Lynwood ORN, MD  fluticasone  furoate-vilanterol (BREO ELLIPTA ) 200-25 MCG/ACT AEPB Inhale 1 puff into the lungs daily. Patient not taking: Reported on 10/09/2023 08/06/23   Norleen Lynwood ORN, MD  LORazepam  (ATIVAN ) 0.5 MG tablet Take 1 tablet (0.5 mg total) by mouth 2 (two) times daily as needed for anxiety. 08/06/23   Norleen Lynwood ORN, MD  losartan -hydrochlorothiazide  (HYZAAR) 100-25 MG tablet Take 1 tablet by mouth daily. 08/06/23   Norleen Lynwood ORN, MD  metoprolol  succinate (TOPROL -XL) 25 MG 24  hr tablet Take 1 tablet (25 mg total) by mouth daily. 12/19/22   Terra Fairy PARAS, PA-C  ondansetron  (ZOFRAN -ODT) 4 MG disintegrating tablet TAKE 1 TABLET BY MOUTH EVERY 8 HOURS AS NEEDED FOR NAUSEA AND VOMITING 07/24/23   Webb, Padonda B, FNP  oxyCODONE -acetaminophen  (PERCOCET/ROXICET) 5-325 MG tablet Take 1 tablet by mouth every 4 (four) hours as needed for up to 18 doses. 10/30/23   Selma Donnice SAUNDERS, MD  potassium chloride  (KLOR-CON  10) 10 MEQ tablet Take 1 tablet (10 mEq total) by mouth daily. 08/06/23   Norleen Lynwood ORN, MD  rivaroxaban  (XARELTO ) 20 MG TABS tablet Take 1 tablet (20 mg total) by mouth daily with supper. 12/19/22   Terra Fairy PARAS, PA-C  triamcinolone  cream (KENALOG ) 0.1 % APPLY 1 APPLICATION TOPICALLY 2 (TWO) TIMES DAILY AS NEEDED (ITCHING). 08/31/23   Norleen Lynwood ORN, MD    Allergies: Morphine , Shrimp [shellfish allergy], Tramadol , Covid-19 (mrna) vaccine, Diltiazem  hcl, and Latex    Review of Systems  Updated Vital Signs BP 128/72 (BP Location: Left Arm)   Pulse (!) 110   Temp 97.7 F (36.5 C) (Oral)   Resp 20   Wt 127 kg   SpO2 100%   BMI 52.05 kg/m   Physical Exam Vitals and nursing note reviewed.  Constitutional:      General: She is not in acute distress.    Appearance:  She is well-developed.  HENT:     Head: Normocephalic and atraumatic.     Mouth/Throat:     Mouth: Mucous membranes are moist.  Eyes:     General: Vision grossly intact. Gaze aligned appropriately.     Extraocular Movements: Extraocular movements intact.     Conjunctiva/sclera: Conjunctivae normal.  Cardiovascular:     Rate and Rhythm: Regular rhythm. Tachycardia present.     Pulses: Normal pulses.     Heart sounds: Normal heart sounds, S1 normal and S2 normal. No murmur heard.    No friction rub. No gallop.  Pulmonary:     Effort: Pulmonary effort is normal. No respiratory distress.     Breath sounds: Normal breath sounds.  Abdominal:     General: Bowel sounds are normal.     Palpations:  Abdomen is soft.     Tenderness: There is generalized abdominal tenderness. There is no guarding or rebound.     Hernia: No hernia is present.  Musculoskeletal:        General: No swelling.     Cervical back: Full passive range of motion without pain, normal range of motion and neck supple. No spinous process tenderness or muscular tenderness. Normal range of motion.     Right lower leg: No edema.     Left lower leg: No edema.  Skin:    General: Skin is warm and dry.     Capillary Refill: Capillary refill takes less than 2 seconds.     Findings: No ecchymosis, erythema, rash or wound.  Neurological:     General: No focal deficit present.     Mental Status: She is alert and oriented to person, place, and time.     GCS: GCS eye subscore is 4. GCS verbal subscore is 5. GCS motor subscore is 6.     Cranial Nerves: Cranial nerves 2-12 are intact.     Sensory: Sensation is intact.     Motor: Motor function is intact.     Coordination: Coordination is intact.  Psychiatric:        Attention and Perception: Attention normal.        Mood and Affect: Mood normal.        Speech: Speech normal.        Behavior: Behavior normal.     (all labs ordered are listed, but only abnormal results are displayed) Labs Reviewed  COMPREHENSIVE METABOLIC PANEL WITH GFR - Abnormal; Notable for the following components:      Result Value   CO2 20 (*)    Glucose, Bld 122 (*)    BUN 24 (*)    Creatinine, Ser 2.04 (*)    Calcium  8.8 (*)    Albumin 3.0 (*)    GFR, Estimated 27 (*)    Anion gap 16 (*)    All other components within normal limits  CBC - Abnormal; Notable for the following components:   WBC 25.5 (*)    All other components within normal limits  URINALYSIS, ROUTINE W REFLEX MICROSCOPIC - Abnormal; Notable for the following components:   Specific Gravity, Urine >1.030 (*)    Hgb urine dipstick LARGE (*)    Protein, ur 30 (*)    Nitrite POSITIVE (*)    Leukocytes,Ua SMALL (*)    All other  components within normal limits  URINALYSIS, MICROSCOPIC (REFLEX) - Abnormal; Notable for the following components:   Bacteria, UA RARE (*)    All other components within normal limits  URINE CULTURE  CULTURE, BLOOD (ROUTINE X 2)  CULTURE, BLOOD (ROUTINE X 2)  LIPASE, BLOOD  I-STAT CG4 LACTIC ACID, ED  I-STAT CG4 LACTIC ACID, ED    EKG: None  Radiology: CT L-SPINE NO CHARGE Result Date: 11/15/2023 CLINICAL DATA:  Back pain. EXAM: CT LUMBAR SPINE WITHOUT CONTRAST TECHNIQUE: Multidetector CT imaging of the lumbar spine was performed without intravenous contrast administration. Multiplanar CT image reconstructions were also generated. RADIATION DOSE REDUCTION: This exam was performed according to the departmental dose-optimization program which includes automated exposure control, adjustment of the mA and/or kV according to patient size and/or use of iterative reconstruction technique. COMPARISON:  None Available. FINDINGS: Segmentation: 5 lumbar type vertebrae. Alignment: Normal. Vertebrae: No acute fracture or focal pathologic process. Paraspinal and other soft tissues: Negative. Disc levels: T12-L1: Unremarkable. L1-2: Mild posterior broad-based bulging disc. L2-3: Posterior broad-based disc bulge with mild multifactorial central canal stenosis. L3-4: Posterior broad-based bulging disc. Bilateral facet overgrowth with thickening of the ligamentum flavum. Mild to moderate central canal stenosis with mild foraminal encroachment bilaterally, left greater than right. L4-5: Posterior broad-based bulging disc with component of disc uncovering. Facet overgrowth. Moderate central canal multifactorial stenosis with minimal biforaminal encroachment. L5-S1: Posterior broad-based disc osteophyte complex. Patent foramina. IMPRESSION: 1. No acute fracture or subluxation of the lumbar spine. 2. Multilevel degenerative disc disease with varying degrees of multifactorial central canal stenosis at L2-3, L3-4 and  L4-5. Electronically Signed   By: Camellia Candle M.D.   On: 11/15/2023 06:44   CT ABDOMEN PELVIS WO CONTRAST Result Date: 11/15/2023 CLINICAL DATA:  Abdominal pain and nausea.  Back pain. EXAM: CT ABDOMEN AND PELVIS WITHOUT CONTRAST TECHNIQUE: Multidetector CT imaging of the abdomen and pelvis was performed following the standard protocol without IV contrast. RADIATION DOSE REDUCTION: This exam was performed according to the departmental dose-optimization program which includes automated exposure control, adjustment of the mA and/or kV according to patient size and/or use of iterative reconstruction technique. COMPARISON:  CT stone study 10/04/2023 FINDINGS: Lower chest: Dependent atelectasis or scarring in the lung bases. Hepatobiliary: Liver is diffusely decreased in attenuation consistent with steatosis. For hyperattenuating lesions are again identified stable since prior and also comparing back to 06/13/2021. These of been previously characterized as benign hemangiomas. There is no evidence for gallstones, gallbladder wall thickening, or pericholecystic fluid. No intrahepatic or extrahepatic biliary dilation. Pancreas: No focal mass lesion. No dilatation of the main duct. No intraparenchymal cyst. No peripancreatic edema. Spleen: No splenomegaly. No suspicious focal mass lesion. Adrenals/Urinary Tract: No adrenal nodule or mass. Punctate stone identified interpolar right kidney with 5 mm stone in the lower pole right kidney. There is mild to moderate right hydronephrosis with a 10 x 7 x 8 mm stone at the right UPJ (48/3). No right ureteral stone. Adjacent small stones in the lower pole of the left kidney measure up to about 5-6 mm maximum size and are nonobstructing. Left ureter unremarkable. Bladder is decompressed. Gas in the decompressed bladder lumen is presumably secondary to recent instrumentation, but infection could cause this appearance. Stomach/Bowel: Stomach is unremarkable. No gastric wall  thickening. No evidence of outlet obstruction. Duodenum is normally positioned as is the ligament of Treitz. No small bowel wall thickening. No small bowel dilatation. The terminal ileum is normal. The appendix is normal. No gross colonic mass. No colonic wall thickening. Diverticular changes are noted in the left colon without evidence of diverticulitis. Vascular/Lymphatic: There is moderate atherosclerotic calcification of the abdominal aorta without aneurysm. There is no gastrohepatic or  hepatoduodenal ligament lymphadenopathy. No retroperitoneal or mesenteric lymphadenopathy. No pelvic sidewall lymphadenopathy. Reproductive: Hysterectomy.  There is no adnexal mass. Other:  No intraperitoneal free fluid. Musculoskeletal: No worrisome lytic or sclerotic osseous abnormality. IMPRESSION: 1. 10 x 7 x 8 mm stone at the right UPJ with mild to moderate right hydronephrosis. 2. Additional bilateral nonobstructing renal stones. 3. Hepatic steatosis. 4. Gas in the decompressed bladder lumen is presumably secondary to recent instrumentation, but bladder infection could cause this appearance. 5.  Aortic Atherosclerosis (ICD10-I70.0). Electronically Signed   By: Camellia Candle M.D.   On: 11/15/2023 06:18     Procedures   Medications Ordered in the ED  lactated ringers  infusion (has no administration in time range)  cefTRIAXone  (ROCEPHIN ) 2 g in sodium chloride  0.9 % 100 mL IVPB (has no administration in time range)  HYDROmorphone  (DILAUDID ) injection 1 mg (1 mg Intravenous Given 11/15/23 0601)  ondansetron  (ZOFRAN ) injection 4 mg (4 mg Intravenous Given 11/15/23 0601)  lactated ringers  bolus 1,000 mL (0 mLs Intravenous Stopped 11/15/23 0731)                                    Medical Decision Making Amount and/or Complexity of Data Reviewed External Data Reviewed: labs, radiology, ECG and notes. Labs: ordered. Decision-making details documented in ED Course. Radiology: ordered and independent interpretation  performed. Decision-making details documented in ED Course.  Risk Prescription drug management.   Differential Diagnosis considered includes, but not limited to: Renal colic/kidney stone; pyelonephritis; aortic dissection; musculoskeletal pain.  Presents with multiple complaints.  She seems to be really complaining of lower abdominal and lower back pain, however.  Reviewing her records reveals a history of kidney stones and urosepsis.  Broad workup was performed.  Patient does have urine that is consistent with infection and abdominal CT shows right UPJ stone that is large.  Treated empirically for sepsis, lactic pending.  Rocephin  2 g IV administered.  Will consult urology, admit patient.  CRITICAL CARE Performed by: Lonni JINNY Seats   Total critical care time: 30 minutes  Critical care time was exclusive of separately billable procedures and treating other patients.  Critical care was necessary to treat or prevent imminent or life-threatening deterioration.  Critical care was time spent personally by me on the following activities: development of treatment plan with patient and/or surrogate as well as nursing, discussions with consultants, evaluation of patient's response to treatment, examination of patient, obtaining history from patient or surrogate, ordering and performing treatments and interventions, ordering and review of laboratory studies, ordering and review of radiographic studies, pulse oximetry and re-evaluation of patient's condition.      Final diagnoses:  Sepsis, due to unspecified organism, unspecified whether acute organ dysfunction present [facility])  Urinary tract infection without hematuria, site unspecified  AKI (acute kidney injury)    ED Discharge Orders     None          Seats Lonni JINNY, MD 11/15/23 [phone]

## 2023-11-15 NOTE — Anesthesia Preprocedure Evaluation (Signed)
 Anesthesia Evaluation  Patient identified by MRN, date of birth, ID band Patient awake    Reviewed: Allergy & Precautions, H&P , NPO status , Patient's Chart, lab work & pertinent test results  History of Anesthesia Complications Negative for: history of anesthetic complications  Airway Mallampati: IV  TM Distance: >3 FB Neck ROM: Full  Mouth opening: Limited Mouth Opening  Dental  (+) Dental Advisory Given, Teeth Intact   Pulmonary neg shortness of breath, asthma , neg sleep apnea, neg recent URI, former smoker, PE    + wheezing      Cardiovascular hypertension, Pt. on medications and Pt. on home beta blockers (-) angina + Peripheral Vascular Disease and +CHF  (-) Past MI + dysrhythmias Atrial Fibrillation  Rhythm:Regular   1. Left ventricular ejection fraction, by estimation, is 50 to 55%. The  left ventricle has low normal function. The left ventricle has no regional  wall motion abnormalities. There is mild concentric left ventricular  hypertrophy. Left ventricular  diastolic parameters are indeterminate.   2. Right ventricular systolic function is normal. The right ventricular  size is normal. Tricuspid regurgitation signal is inadequate for assessing  PA pressure.   3. Left atrial size was mildly dilated.   4. Right atrial size was mildly dilated.   5. A small pericardial effusion is present. The pericardial effusion is  circumferential.   6. The mitral valve is normal in structure. No evidence of mitral valve  regurgitation. No evidence of mitral stenosis.   7. The aortic valve is grossly normal. Aortic valve regurgitation is not  visualized. No aortic stenosis is present.   8. The inferior vena cava is normal in size with <50% respiratory  variability, suggesting right atrial pressure of 8 mmHg.     Neuro/Psych  PSYCHIATRIC DISORDERS Anxiety Depression    negative neurological ROS     GI/Hepatic Neg liver ROS,GERD   Medicated and Controlled,,  Endo/Other    Class 4 obesity  Renal/GU Renal InsufficiencyRenal diseaseLab Results      Component                Value               Date                      NA                       138                 10/08/2023                K                        4.4                 10/08/2023                CO2                      26                  10/08/2023                GLUCOSE                  122 (H)  10/08/2023                BUN                      28 (H)              10/08/2023                CREATININE               1.19 (H)            10/08/2023                CALCIUM                   9.3                 10/08/2023                GFR                      50.07 (L)           08/06/2023                GFRNONAA                 52 (L)              10/08/2023                Musculoskeletal  (+) Arthritis ,    Abdominal   Peds  Hematology negative hematology ROS (+) Lab Results      Component                Value               Date                      WBC                      12.2 (H)            10/08/2023                HGB                      12.6                10/08/2023                HCT                      40.1                10/08/2023                MCV                      95.2                10/08/2023                PLT                      235                 10/08/2023  Anesthesia Other Findings   Reproductive/Obstetrics                              Anesthesia Physical Anesthesia Plan  ASA: 4  Anesthesia Plan: General   Post-op Pain Management: Minimal or no pain anticipated   Induction: Intravenous, Rapid sequence and Cricoid pressure planned  PONV Risk Score and Plan: 3 and Ondansetron  and Dexamethasone   Airway Management Planned: Oral ETT and Video Laryngoscope Planned  Additional Equipment: None  Intra-op Plan:   Post-operative Plan: Extubation in OR  Informed  Consent: I have reviewed the patients History and Physical, chart, labs and discussed the procedure including the risks, benefits and alternatives for the proposed anesthesia with the patient or authorized representative who has indicated his/her understanding and acceptance.     Dental advisory given  Plan Discussed with: CRNA, Anesthesiologist and Surgeon  Anesthesia Plan Comments:          Anesthesia Quick Evaluation

## 2023-11-15 NOTE — Consult Note (Signed)
 Urology Consult   History of Present Illness: [name] is a 63 y.o. with history of urolithiasis presents to the ED today with complaints of right-sided flank pain.  Patient complains of right-sided flank pain, urinalysis with positive nitrites, tachycardic in the 110s, afebrile. CT A/P 11/15/2023 revealed 10 mm stone in the right UPJ with moderate right hydronephrosis with several additional bilateral nonobstructing stones.  She recently underwent right ureteroscopy to clear a distal right ureteral stone.  Unfortunately peers that her right lower pole nonobstructing stone has now become obstructed.  She does have some pain however this well-controlled.  Past Medical History:  Diagnosis Date   Abscess of bladder 07/28/2016   Alcohol dependence (HCC)    Allergic rhinitis 12/06/2013   Anxiety    Asthma 03/16/2015   Atrial flutter with rapid ventricular response (HCC) 04/01/2020   COLONIC POLYPS, HX OF 04/05/2010   COVID-19 09/2023   DIVERTICULITIS, HX OF 04/05/2010   DJD (degenerative joint disease)    right knee, mot to severe   Dysrhythmia    Fatty liver    GERD (gastroesophageal reflux disease)    no meds   Heart murmur    hx of    Hemorrhoids    History of kidney stones    Hyperlipidemia    Hypertension    Impaired glucose tolerance 12/06/2013   Morbid obesity with BMI of 50.0-59.9, adult (HCC)    Nausea & vomiting 05/24/2022   Peripheral vascular disease    Pulmonary embolism (HCC)     Past Surgical History:  Procedure Laterality Date   ABDOMINAL HYSTERECTOMY  age 56   fibroids   BREAST BIOPSY Left    COLONOSCOPY WITH PROPOFOL  N/A 07/25/2016   Procedure: COLONOSCOPY WITH PROPOFOL ;  Surgeon: Rollin Dover, MD;  Location: [facility];  Service: Endoscopy;  Laterality: N/A;   colonscopy     x 2   CYSTOSCOPY W/ RETROGRADES Right 10/30/2023   Procedure: CYSTOSCOPY, WITH RETROGRADE PYELOGRAM;  Surgeon: Selma Donnice SAUNDERS, MD;  Location: [facility];  Service: Urology;   Laterality: Right;   CYSTOSCOPY W/ URETERAL STENT PLACEMENT Right 09/05/2021   Procedure: CYSTOSCOPY WITH RETROGRADE PYELOGRAM/URETERAL STENT PLACEMENT;  Surgeon: Selma Donnice SAUNDERS, MD;  Location: [facility];  Service: Urology;  Laterality: Right;   CYSTOSCOPY W/ URETERAL STENT PLACEMENT Right 10/04/2023   Procedure: CYSTOSCOPY, WITH RETROGRADE PYELOGRAM AND URETERAL STENT INSERTION;  Surgeon: Shane Steffan BROCKS, MD;  Location: [facility] OR;  Service: Urology;  Laterality: Right;   CYSTOSCOPY WITH RETROGRADE PYELOGRAM, URETEROSCOPY AND STENT PLACEMENT Left 08/14/2022   Procedure: CYSTOSCOPY WITH RETROGRADE PYELOGRAM;  Surgeon: Selma Donnice SAUNDERS, MD;  Location: [facility] OR;  Service: Urology;  Laterality: Left;   CYSTOSCOPY WITH URETEROSCOPY AND STENT PLACEMENT Left 08/14/2022   Procedure: URETEROSCOPY AND STENT PLACEMENT;  Surgeon: Selma Donnice SAUNDERS, MD;  Location: [facility] OR;  Service: Urology;  Laterality: Left;   CYSTOSCOPY/URETEROSCOPY/HOLMIUM LASER/STENT PLACEMENT Right 09/30/2021   Procedure: CYSTOSCOPY/ RETROGRADE/URETEROSCOPY/HOLMIUM LASER/STENT PLACEMENT;  Surgeon: Selma Donnice SAUNDERS, MD;  Location: [facility];  Service: Urology;  Laterality: Right;   CYSTOSCOPY/URETEROSCOPY/HOLMIUM LASER/STENT PLACEMENT Left 09/12/2022   Procedure: CYSTOSCOPY/LEFT RETROGRADE PYELOGRAM/LEFT URETEROSCOPY/HOLMIUM LASER/LEFT STENT EXCHANGE;  Surgeon: Selma Donnice SAUNDERS, MD;  Location: [facility];  Service: Urology;  Laterality: Left;  75 MINUTES NEEDED FOR CASE   CYSTOSCOPY/URETEROSCOPY/HOLMIUM LASER/STENT PLACEMENT Right 10/30/2023   Procedure: CYSTOSCOPY/URETEROSCOPY/HOLMIUM LASER/STENT PLACEMENT;  Surgeon: Selma Donnice SAUNDERS, MD;  Location: [facility];  Service: Urology;  Laterality: Right;   IR RADIOLOGIST EVAL & MGMT  08/12/2016  IR RADIOLOGIST EVAL & MGMT  08/21/2016   KNEE ARTHROSCOPY     left    TOTAL KNEE ARTHROPLASTY  07/29/2011   Procedure: TOTAL KNEE ARTHROPLASTY;  Surgeon: Donnice JONETTA Car, MD;  Location: [facility];  Service: Orthopedics;  Laterality: Right;    TOTAL KNEE ARTHROPLASTY Left 12/14/2012   Procedure: LEFT TOTAL KNEE ARTHROPLASTY;  Surgeon: Donnice JONETTA Car, MD;  Location: [facility];  Service: Orthopedics;  Laterality: Left;     Current Hospital Medications:  Home meds:  No current facility-administered medications on file prior to encounter.   Current Outpatient Medications on File Prior to Encounter  Medication Sig Dispense Refill   albuterol  (VENTOLIN  HFA) 108 (90 Base) MCG/ACT inhaler INHALE 2 PUFFS INTO THE LUNGS EVERY 6 HOURS AS NEEDED 18 each 5   atorvastatin  (LIPITOR) 20 MG tablet Take 1 tablet (20 mg total) by mouth daily. 90 tablet 3   calcium  carbonate (TUMS - DOSED IN MG ELEMENTAL CALCIUM ) 500 MG chewable tablet Chew 1 tablet by mouth daily as needed for indigestion or heartburn.     Carboxymethylcellul-Glycerin (CLEAR EYES FOR DRY EYES OP) Place 1 drop into both eyes daily as needed (Dry eyes).     docusate sodium  (COLACE) 100 MG capsule Take 1 capsule (100 mg total) by mouth daily as needed for up to 30 doses. 30 capsule 0   famotidine  (PEPCID ) 20 MG tablet Take 1 tablet (20 mg total) by mouth 2 (two) times daily. 180 tablet 3   LORazepam  (ATIVAN ) 0.5 MG tablet Take 1 tablet (0.5 mg total) by mouth 2 (two) times daily as needed for anxiety. 30 tablet 2   losartan -hydrochlorothiazide  (HYZAAR) 100-25 MG tablet Take 1 tablet by mouth daily. 30 tablet 8   metoprolol  succinate (TOPROL -XL) 25 MG 24 hr tablet Take 1 tablet (25 mg total) by mouth daily. 30 tablet 11   oxyCODONE -acetaminophen  (PERCOCET/ROXICET) 5-325 MG tablet Take 1 tablet by mouth every 4 (four) hours as needed for up to 18 doses. 18 tablet 0   potassium chloride  (KLOR-CON  10) 10 MEQ tablet Take 1 tablet (10 mEq total) by mouth daily. 90 tablet 3   rivaroxaban  (XARELTO ) 20 MG TABS tablet Take 1 tablet (20 mg total) by mouth daily with supper. 30 tablet 6   triamcinolone  cream (KENALOG ) 0.1 % APPLY 1 APPLICATION TOPICALLY 2 (TWO) TIMES DAILY AS NEEDED (ITCHING). 30 g  2   fluticasone  furoate-vilanterol (BREO ELLIPTA ) 200-25 MCG/ACT AEPB Inhale 1 puff into the lungs daily. (Patient not taking: Reported on 10/09/2023) 3 each 3   ondansetron  (ZOFRAN -ODT) 4 MG disintegrating tablet TAKE 1 TABLET BY MOUTH EVERY 8 HOURS AS NEEDED FOR NAUSEA AND VOMITING (Patient not taking: Reported on 11/15/2023) 18 tablet 3     Scheduled Meds:  atorvastatin   20 mg Oral Daily   metoprolol  succinate  25 mg Oral Daily   rivaroxaban   20 mg Oral Q supper   Continuous Infusions:  [START ON 11/16/2023] cefTRIAXone  (ROCEPHIN )  IV     lactated ringers  150 mL/hr at 11/15/23 0753   PRN Meds:.HYDROmorphone  (DILAUDID ) injection  Allergies:  Allergies  Allergen Reactions   Morphine  Itching   Shrimp [Shellfish Allergy] Itching and Other (See Comments)    Tongue burns also   Tramadol  Other (See Comments)    Caused confusion   Covid-19 (Mrna) Vaccine Hives   Diltiazem  Hcl Itching    Pt with itching of the feet when bolus given   Latex Rash    Family History  Problem Relation Age  of Onset   Stroke Mother    COPD Father    Lymphoma Sister    Alcoholism Sister     Social History:  reports that she quit smoking about 40 years ago. Her smoking use included cigarettes. She started smoking about 47 years ago. She has a 3.5 pack-year smoking history. She has never been exposed to tobacco smoke. She has never used smokeless tobacco. She reports current alcohol use of about 1.0 standard drink of alcohol per week. She reports that she does not currently use drugs after having used the following drugs: Marijuana.  ROS: A complete review of systems was performed.  All systems are negative except for pertinent findings as noted.  Physical Exam:  Vital signs in last 24 hours: Temp:  [97.7 F (36.5 C)-99.4 F (37.4 C)] 97.7 F (36.5 C) (10/05 0730) Pulse Rate:  [110-122] 110 (10/05 0730) Resp:  [20-23] 20 (10/05 0730) BP: (118-152)/(62-72) 128/72 (10/05 0730) SpO2:  [91 %-100 %] 100 %  (10/05 0730) Weight:  [127 kg] 127 kg (10/05 0401) Constitutional:  Alert and oriented, No acute distress Cardiovascular: Regular rate and rhythm Respiratory: Normal respiratory effort, Lungs clear bilaterally GI: Abdomen is soft, nontender, nondistended, no abdominal masses GU: No CVA tenderness Neurologic: Grossly intact, no focal deficits Psychiatric: Normal mood and affect  Laboratory Data:  Recent Labs    11/15/23 0405  WBC 25.5*  HGB 13.3  HCT 41.1  PLT 255    Recent Labs    11/15/23 0405  NA 138  K 3.9  CL 102  GLUCOSE 122*  BUN 24*  CALCIUM  8.8*  CREATININE 2.04*     Results for orders placed or performed during the hospital encounter of 11/15/23 (from the past 24 hours)  Urinalysis, Routine w reflex microscopic -Urine, Clean Catch     Status: Abnormal   Collection Time: 11/15/23  4:03 AM  Result Value Ref Range   Color, Urine YELLOW YELLOW   APPearance CLEAR CLEAR   Specific Gravity, Urine >1.030 (H) 1.005 - 1.030   pH 6.0 5.0 - 8.0   Glucose, UA NEGATIVE NEGATIVE mg/dL   Hgb urine dipstick LARGE (A) NEGATIVE   Bilirubin Urine NEGATIVE NEGATIVE   Ketones, ur NEGATIVE NEGATIVE mg/dL   Protein, ur 30 (A) NEGATIVE mg/dL   Nitrite POSITIVE (A) NEGATIVE   Leukocytes,Ua SMALL (A) NEGATIVE  Urinalysis, Microscopic (reflex)     Status: Abnormal   Collection Time: 11/15/23  4:03 AM  Result Value Ref Range   RBC / HPF 6-10 0 - 5 RBC/hpf   WBC, UA 21-50 0 - 5 WBC/hpf   Bacteria, UA RARE (A) NONE SEEN   Squamous Epithelial / HPF 0-5 0 - 5 /HPF  Lipase, blood     Status: None   Collection Time: 11/15/23  4:05 AM  Result Value Ref Range   Lipase 15 11 - 51 U/L  Comprehensive metabolic panel     Status: Abnormal   Collection Time: 11/15/23  4:05 AM  Result Value Ref Range   Sodium 138 135 - 145 mmol/L   Potassium 3.9 3.5 - 5.1 mmol/L   Chloride 102 98 - 111 mmol/L   CO2 20 (L) 22 - 32 mmol/L   Glucose, Bld 122 (H) 70 - 99 mg/dL   BUN 24 (H) 8 - 23 mg/dL    Creatinine, Ser 7.95 (H) 0.44 - 1.00 mg/dL   Calcium  8.8 (L) 8.9 - 10.3 mg/dL   Total Protein 7.4 6.5 - 8.1 g/dL  Albumin 3.0 (L) 3.5 - 5.0 g/dL   AST 41 15 - 41 U/L   ALT 33 0 - 44 U/L   Alkaline Phosphatase 113 38 - 126 U/L   Total Bilirubin 0.8 0.0 - 1.2 mg/dL   GFR, Estimated 27 (L) >60 mL/min   Anion gap 16 (H) 5 - 15  CBC     Status: Abnormal   Collection Time: 11/15/23  4:05 AM  Result Value Ref Range   WBC 25.5 (H) 4.0 - 10.5 K/uL   RBC 4.39 3.87 - 5.11 MIL/uL   Hemoglobin 13.3 12.0 - 15.0 g/dL   HCT 58.8 63.9 - 53.9 %   MCV 93.6 80.0 - 100.0 fL   MCH 30.3 26.0 - 34.0 pg   MCHC 32.4 30.0 - 36.0 g/dL   RDW 86.0 88.4 - 84.4 %   Platelets 255 150 - 400 K/uL   nRBC 0.1 0.0 - 0.2 %   No results found for this or any previous visit (from the past 240 hours).  Renal Function: Recent Labs    11/15/23 0405  CREATININE 2.04*   Estimated Creatinine Clearance: 36.2 mL/min (A) (by C-G formula based on SCr of 2.04 mg/dL (H)).  Radiologic Imaging: CT L-SPINE NO CHARGE Result Date: 11/15/2023 CLINICAL DATA:  Back pain. EXAM: CT LUMBAR SPINE WITHOUT CONTRAST TECHNIQUE: Multidetector CT imaging of the lumbar spine was performed without intravenous contrast administration. Multiplanar CT image reconstructions were also generated. RADIATION DOSE REDUCTION: This exam was performed according to the departmental dose-optimization program which includes automated exposure control, adjustment of the mA and/or kV according to patient size and/or use of iterative reconstruction technique. COMPARISON:  None Available. FINDINGS: Segmentation: 5 lumbar type vertebrae. Alignment: Normal. Vertebrae: No acute fracture or focal pathologic process. Paraspinal and other soft tissues: Negative. Disc levels: T12-L1: Unremarkable. L1-2: Mild posterior broad-based bulging disc. L2-3: Posterior broad-based disc bulge with mild multifactorial central canal stenosis. L3-4: Posterior broad-based bulging disc.  Bilateral facet overgrowth with thickening of the ligamentum flavum. Mild to moderate central canal stenosis with mild foraminal encroachment bilaterally, left greater than right. L4-5: Posterior broad-based bulging disc with component of disc uncovering. Facet overgrowth. Moderate central canal multifactorial stenosis with minimal biforaminal encroachment. L5-S1: Posterior broad-based disc osteophyte complex. Patent foramina. IMPRESSION: 1. No acute fracture or subluxation of the lumbar spine. 2. Multilevel degenerative disc disease with varying degrees of multifactorial central canal stenosis at L2-3, L3-4 and L4-5. Electronically Signed   By: Camellia Candle M.D.   On: 11/15/2023 06:44   CT ABDOMEN PELVIS WO CONTRAST Result Date: 11/15/2023 CLINICAL DATA:  Abdominal pain and nausea.  Back pain. EXAM: CT ABDOMEN AND PELVIS WITHOUT CONTRAST TECHNIQUE: Multidetector CT imaging of the abdomen and pelvis was performed following the standard protocol without IV contrast. RADIATION DOSE REDUCTION: This exam was performed according to the departmental dose-optimization program which includes automated exposure control, adjustment of the mA and/or kV according to patient size and/or use of iterative reconstruction technique. COMPARISON:  CT stone study 10/04/2023 FINDINGS: Lower chest: Dependent atelectasis or scarring in the lung bases. Hepatobiliary: Liver is diffusely decreased in attenuation consistent with steatosis. For hyperattenuating lesions are again identified stable since prior and also comparing back to 06/13/2021. These of been previously characterized as benign hemangiomas. There is no evidence for gallstones, gallbladder wall thickening, or pericholecystic fluid. No intrahepatic or extrahepatic biliary dilation. Pancreas: No focal mass lesion. No dilatation of the main duct. No intraparenchymal cyst. No peripancreatic edema. Spleen: No splenomegaly. No  suspicious focal mass lesion. Adrenals/Urinary Tract:  No adrenal nodule or mass. Punctate stone identified interpolar right kidney with 5 mm stone in the lower pole right kidney. There is mild to moderate right hydronephrosis with a 10 x 7 x 8 mm stone at the right UPJ (48/3). No right ureteral stone. Adjacent small stones in the lower pole of the left kidney measure up to about 5-6 mm maximum size and are nonobstructing. Left ureter unremarkable. Bladder is decompressed. Gas in the decompressed bladder lumen is presumably secondary to recent instrumentation, but infection could cause this appearance. Stomach/Bowel: Stomach is unremarkable. No gastric wall thickening. No evidence of outlet obstruction. Duodenum is normally positioned as is the ligament of Treitz. No small bowel wall thickening. No small bowel dilatation. The terminal ileum is normal. The appendix is normal. No gross colonic mass. No colonic wall thickening. Diverticular changes are noted in the left colon without evidence of diverticulitis. Vascular/Lymphatic: There is moderate atherosclerotic calcification of the abdominal aorta without aneurysm. There is no gastrohepatic or hepatoduodenal ligament lymphadenopathy. No retroperitoneal or mesenteric lymphadenopathy. No pelvic sidewall lymphadenopathy. Reproductive: Hysterectomy.  There is no adnexal mass. Other:  No intraperitoneal free fluid. Musculoskeletal: No worrisome lytic or sclerotic osseous abnormality. IMPRESSION: 1. 10 x 7 x 8 mm stone at the right UPJ with mild to moderate right hydronephrosis. 2. Additional bilateral nonobstructing renal stones. 3. Hepatic steatosis. 4. Gas in the decompressed bladder lumen is presumably secondary to recent instrumentation, but bladder infection could cause this appearance. 5.  Aortic Atherosclerosis (ICD10-I70.0). Electronically Signed   By: Camellia Candle M.D.   On: 11/15/2023 06:18    I independently reviewed the above imaging studies.  Impression/Recommendation: Right UPJ stone with infection  -  Reviewed CT A/P with 10 mm stone in the right UPJ with moderate hydronephrosis, additional bilateral renal stones. - Positive nitrite urine indicating infection - Recommend proceeding with cystoscopy, bilateral retrograde pyelogram, bilateral stent placement in preparation for subsequent bilateral ureteroscopy to clear her stone burden. -Continue IV antibiotics.  Follow-up urine culture.  Will likely need about 10 days culture specific antibiotics. -Will arrange follow-up with me for definitive treatment of her stones. -The risks, benefits and alternatives of cystoscopy with bilateral JJ stent placement was discussed with the patient.  Risks include, but are not limited to: bleeding, urinary tract infection, ureteral injury, ureteral stricture disease, chronic pain, urinary symptoms, bladder injury, stent migration, the need for nephrostomy tube placement, MI, CVA, DVT, PE and the inherent risks with general anesthesia.  The patient voices understanding and wishes to proceed.    Matt R. Lanea Vankirk MD 11/15/2023, 11:06 AM  [facility]  Pager: [phone]

## 2023-11-15 NOTE — Plan of Care (Signed)
  Problem: Education: Goal: Knowledge of General Education information will improve Description: Including pain rating scale, medication(s)/side effects and non-pharmacologic comfort measures 11/15/2023 1547 by Shela Lapine, RN Outcome: Progressing 11/15/2023 1517 by Shela Lapine, RN Outcome: Progressing   Problem: Health Behavior/Discharge Planning: Goal: Ability to manage health-related needs will improve 11/15/2023 1547 by Shela Lapine, RN Outcome: Progressing 11/15/2023 1517 by Shela Lapine, RN Outcome: Progressing   Problem: Clinical Measurements: Goal: Ability to maintain clinical measurements within normal limits will improve 11/15/2023 1547 by Shela Lapine, RN Outcome: Progressing 11/15/2023 1517 by Shela Lapine, RN Outcome: Progressing Goal: Will remain free from infection 11/15/2023 1547 by Shela Lapine, RN Outcome: Progressing 11/15/2023 1517 by Shela Lapine, RN Outcome: Progressing Goal: Diagnostic test results will improve 11/15/2023 1547 by Shela Lapine, RN Outcome: Progressing 11/15/2023 1517 by Shela Lapine, RN Outcome: Progressing Goal: Respiratory complications will improve 11/15/2023 1547 by Shela Lapine, RN Outcome: Progressing 11/15/2023 1517 by Shela Lapine, RN Outcome: Progressing Goal: Cardiovascular complication will be avoided 11/15/2023 1547 by Shela Lapine, RN Outcome: Progressing 11/15/2023 1517 by Shela Lapine, RN Outcome: Progressing   Problem: Activity: Goal: Risk for activity intolerance will decrease 11/15/2023 1547 by Shela Lapine, RN Outcome: Progressing 11/15/2023 1517 by Shela Lapine, RN Outcome: Progressing   Problem: Nutrition: Goal: Adequate nutrition will be maintained 11/15/2023 1547 by Shela Lapine, RN Outcome: Progressing 11/15/2023 1517 by Shela Lapine, RN Outcome: Progressing   Problem: Coping: Goal: Level of anxiety will decrease 11/15/2023 1547 by Shela Lapine, RN Outcome:  Progressing 11/15/2023 1517 by Shela Lapine, RN Outcome: Progressing   Problem: Elimination: Goal: Will not experience complications related to bowel motility 11/15/2023 1547 by Shela Lapine, RN Outcome: Progressing 11/15/2023 1517 by Shela Lapine, RN Outcome: Progressing Goal: Will not experience complications related to urinary retention 11/15/2023 1547 by Shela Lapine, RN Outcome: Progressing 11/15/2023 1517 by Shela Lapine, RN Outcome: Progressing   Problem: Pain Managment: Goal: General experience of comfort will improve and/or be controlled 11/15/2023 1547 by Shela Lapine, RN Outcome: Progressing 11/15/2023 1517 by Shela Lapine, RN Outcome: Progressing   Problem: Safety: Goal: Ability to remain free from injury will improve 11/15/2023 1547 by Shela Lapine, RN Outcome: Progressing 11/15/2023 1517 by Shela Lapine, RN Outcome: Progressing   Problem: Skin Integrity: Goal: Risk for impaired skin integrity will decrease 11/15/2023 1547 by Shela Lapine, RN Outcome: Progressing 11/15/2023 1517 by Shela Lapine, RN Outcome: Progressing

## 2023-11-15 NOTE — Transfer of Care (Signed)
 Immediate Anesthesia Transfer of Care Note  Patient: [name]  Procedure(s) Performed: CYSTOSCOPY, WITH RETROGRADE PYELOGRAM AND URETERAL STENT INSERTION (Right)  Patient Location: PACU  Anesthesia Type:General  Level of Consciousness: awake, alert , oriented, and patient cooperative  Airway & Oxygen Therapy: Patient Spontanous Breathing and Patient connected to face mask oxygen  Post-op Assessment: Report given to RN, Post -op Vital signs reviewed and stable, Patient moving all extremities, Patient moving all extremities X 4, and Patient able to stick tongue midline  Post vital signs: Reviewed and stable  Last Vitals:  Vitals Value Taken Time  BP 107/61 11/15/23 13:15  Temp 36.7 C 11/15/23 13:10  Pulse 102 11/15/23 13:19  Resp 19 11/15/23 13:19  SpO2 91 % 11/15/23 13:19  Vitals shown include unfiled device data.  Last Pain:  Vitals:   11/15/23 1204  TempSrc: Oral  PainSc:          Complications: No notable events documented.

## 2023-11-15 NOTE — Sepsis Progress Note (Addendum)
 Elink following code sepsis  Notified bedside nurse of need to draw lactic acid and blood cultures.   Abx given but labs unable to be collected prior to patient going to the OR

## 2023-11-15 NOTE — H&P (Signed)
 History and Physical    Patient: [name] FMW:[id number] DOB: February 25, 1960 DOA: 11/15/2023 DOS: the patient was seen and examined on 11/15/2023 PCP: Norleen Lynwood ORN, MD  Patient coming from: Home  Chief Complaint:  Chief Complaint  Patient presents with   Abdominal Pain   HPI: [name] is a 63 y.o. female with medical history significant of paroxysmal Afib, asthma, diverticulosis, GERD, hyperlipidemia, hypertension , and recent admission in 09/2023 for R hydronephrosis iso nephroureterolithiasis c/b E. Coli bacteremia requiring Urology for ureteral stenting (which pt subsequently removed) p/w AKI iso R hydronephrosis and CT abd with obstructive renal stone at R UPJ with Urology planning stent placement.  The patient presented with complaints of stomach bloating, pain in the back, and pain in the upper R leg. The patient reported that the back part of the heart was hurting. Additionally, the patient experienced nausea and was unable to keep food down, as attempts to eat resulted in vomiting. The patient mentioned having similar symptoms about a month ago, which led to a previous hospital visit. The patient expressed concern about the recurrence of these symptoms and the need to determine the underlying cause; thus, she reported to the ED.  In the ED, pt tachycardic and tachypneic on RA. Labs notable for Cr 2.04 (baseline 1.19 on 8.28), and WBC 25.5. EDP started IV CTX, consulted Urology, and requested medicine admission.   Review of Systems: As mentioned in the history of present illness. All other systems reviewed and are negative. Past Medical History:  Diagnosis Date   Abscess of bladder 07/28/2016   Alcohol dependence (HCC)    Allergic rhinitis 12/06/2013   Anxiety    Asthma 03/16/2015   Atrial flutter with rapid ventricular response (HCC) 04/01/2020   COLONIC POLYPS, HX OF 04/05/2010   COVID-19 09/2023   DIVERTICULITIS, HX OF 04/05/2010   DJD (degenerative joint disease)     right knee, mot to severe   Dysrhythmia    Fatty liver    GERD (gastroesophageal reflux disease)    no meds   Heart murmur    hx of    Hemorrhoids    History of kidney stones    Hyperlipidemia    Hypertension    Impaired glucose tolerance 12/06/2013   Morbid obesity with BMI of 50.0-59.9, adult (HCC)    Nausea & vomiting 05/24/2022   Peripheral vascular disease    Pulmonary embolism (HCC)    Past Surgical History:  Procedure Laterality Date   ABDOMINAL HYSTERECTOMY  age 80   fibroids   BREAST BIOPSY Left    COLONOSCOPY WITH PROPOFOL  N/A 07/25/2016   Procedure: COLONOSCOPY WITH PROPOFOL ;  Surgeon: Rollin Dover, MD;  Location: [facility];  Service: Endoscopy;  Laterality: N/A;   colonscopy     x 2   CYSTOSCOPY W/ RETROGRADES Right 10/30/2023   Procedure: CYSTOSCOPY, WITH RETROGRADE PYELOGRAM;  Surgeon: Selma Donnice SAUNDERS, MD;  Location: [facility];  Service: Urology;  Laterality: Right;   CYSTOSCOPY W/ URETERAL STENT PLACEMENT Right 09/05/2021   Procedure: CYSTOSCOPY WITH RETROGRADE PYELOGRAM/URETERAL STENT PLACEMENT;  Surgeon: Selma Donnice SAUNDERS, MD;  Location: [facility];  Service: Urology;  Laterality: Right;   CYSTOSCOPY W/ URETERAL STENT PLACEMENT Right 10/04/2023   Procedure: CYSTOSCOPY, WITH RETROGRADE PYELOGRAM AND URETERAL STENT INSERTION;  Surgeon: Shane Steffan BROCKS, MD;  Location: [facility] OR;  Service: Urology;  Laterality: Right;   CYSTOSCOPY WITH RETROGRADE PYELOGRAM, URETEROSCOPY AND STENT PLACEMENT Left 08/14/2022   Procedure: CYSTOSCOPY WITH RETROGRADE PYELOGRAM;  Surgeon: Selma,  Donnice SAUNDERS, MD;  Location: [facility] OR;  Service: Urology;  Laterality: Left;   CYSTOSCOPY WITH URETEROSCOPY AND STENT PLACEMENT Left 08/14/2022   Procedure: URETEROSCOPY AND STENT PLACEMENT;  Surgeon: Selma Donnice SAUNDERS, MD;  Location: [facility] OR;  Service: Urology;  Laterality: Left;   CYSTOSCOPY/URETEROSCOPY/HOLMIUM LASER/STENT PLACEMENT Right 09/30/2021   Procedure: CYSTOSCOPY/ RETROGRADE/URETEROSCOPY/HOLMIUM LASER/STENT  PLACEMENT;  Surgeon: Selma Donnice SAUNDERS, MD;  Location: [facility];  Service: Urology;  Laterality: Right;   CYSTOSCOPY/URETEROSCOPY/HOLMIUM LASER/STENT PLACEMENT Left 09/12/2022   Procedure: CYSTOSCOPY/LEFT RETROGRADE PYELOGRAM/LEFT URETEROSCOPY/HOLMIUM LASER/LEFT STENT EXCHANGE;  Surgeon: Selma Donnice SAUNDERS, MD;  Location: [facility];  Service: Urology;  Laterality: Left;  75 MINUTES NEEDED FOR CASE   CYSTOSCOPY/URETEROSCOPY/HOLMIUM LASER/STENT PLACEMENT Right 10/30/2023   Procedure: CYSTOSCOPY/URETEROSCOPY/HOLMIUM LASER/STENT PLACEMENT;  Surgeon: Selma Donnice SAUNDERS, MD;  Location: [facility];  Service: Urology;  Laterality: Right;   IR RADIOLOGIST EVAL & MGMT  08/12/2016   IR RADIOLOGIST EVAL & MGMT  08/21/2016   KNEE ARTHROSCOPY     left    TOTAL KNEE ARTHROPLASTY  07/29/2011   Procedure: TOTAL KNEE ARTHROPLASTY;  Surgeon: Donnice JONETTA Car, MD;  Location: [facility];  Service: Orthopedics;  Laterality: Right;   TOTAL KNEE ARTHROPLASTY Left 12/14/2012   Procedure: LEFT TOTAL KNEE ARTHROPLASTY;  Surgeon: Donnice JONETTA Car, MD;  Location: [facility];  Service: Orthopedics;  Laterality: Left;   Social History:  reports that she quit smoking about 40 years ago. Her smoking use included cigarettes. She started smoking about 47 years ago. She has a 3.5 pack-year smoking history. She has never been exposed to tobacco smoke. She has never used smokeless tobacco. She reports current alcohol use of about 1.0 standard drink of alcohol per week. She reports that she does not currently use drugs after having used the following drugs: Marijuana.  Allergies  Allergen Reactions   Morphine  Itching   Shrimp [Shellfish Allergy] Itching and Other (See Comments)    Tongue burns also   Tramadol  Other (See Comments)    Caused confusion   Covid-19 (Mrna) Vaccine Hives   Diltiazem  Hcl Itching    Pt with itching of the feet when bolus given   Latex Rash    Family History  Problem Relation Age of Onset   Stroke Mother    COPD Father    Lymphoma  Sister    Alcoholism Sister     Prior to Admission medications   Medication Sig Start Date End Date Taking? Authorizing Provider  albuterol  (VENTOLIN  HFA) 108 (90 Base) MCG/ACT inhaler INHALE 2 PUFFS INTO THE LUNGS EVERY 6 HOURS AS NEEDED 08/06/23  Yes Norleen Lynwood ORN, MD  atorvastatin  (LIPITOR) 20 MG tablet Take 1 tablet (20 mg total) by mouth daily. 08/06/23  Yes Norleen Lynwood ORN, MD  calcium  carbonate (TUMS - DOSED IN MG ELEMENTAL CALCIUM ) 500 MG chewable tablet Chew 1 tablet by mouth daily as needed for indigestion or heartburn.   Yes [provider]  Carboxymethylcellul-Glycerin (CLEAR EYES FOR DRY EYES OP) Place 1 drop into both eyes daily as needed (Dry eyes).   Yes [provider]  docusate sodium  (COLACE) 100 MG capsule Take 1 capsule (100 mg total) by mouth daily as needed for up to 30 doses. 10/30/23  Yes Selma Donnice SAUNDERS, MD  famotidine  (PEPCID ) 20 MG tablet Take 1 tablet (20 mg total) by mouth 2 (two) times daily. 08/06/23  Yes Norleen Lynwood ORN, MD  LORazepam  (ATIVAN ) 0.5 MG tablet Take 1 tablet (0.5 mg total) by mouth 2 (two) times  daily as needed for anxiety. 08/06/23  Yes Norleen Lynwood ORN, MD  losartan -hydrochlorothiazide  (HYZAAR) 100-25 MG tablet Take 1 tablet by mouth daily. 08/06/23  Yes Norleen Lynwood ORN, MD  metoprolol  succinate (TOPROL -XL) 25 MG 24 hr tablet Take 1 tablet (25 mg total) by mouth daily. 12/19/22  Yes Terra Fairy PARAS, PA-C  oxyCODONE -acetaminophen  (PERCOCET/ROXICET) 5-325 MG tablet Take 1 tablet by mouth every 4 (four) hours as needed for up to 18 doses. 10/30/23  Yes Selma Donnice SAUNDERS, MD  potassium chloride  (KLOR-CON  10) 10 MEQ tablet Take 1 tablet (10 mEq total) by mouth daily. 08/06/23  Yes Norleen Lynwood ORN, MD  rivaroxaban  (XARELTO ) 20 MG TABS tablet Take 1 tablet (20 mg total) by mouth daily with supper. 12/19/22  Yes Terra Fairy PARAS, PA-C  triamcinolone  cream (KENALOG ) 0.1 % APPLY 1 APPLICATION TOPICALLY 2 (TWO) TIMES DAILY AS NEEDED (ITCHING). 08/31/23  Yes Norleen Lynwood ORN, MD  fluticasone  furoate-vilanterol (BREO ELLIPTA ) 200-25 MCG/ACT AEPB Inhale 1 puff into the lungs daily. Patient not taking: Reported on 10/09/2023 08/06/23   Norleen Lynwood ORN, MD  ondansetron  (ZOFRAN -ODT) 4 MG disintegrating tablet TAKE 1 TABLET BY MOUTH EVERY 8 HOURS AS NEEDED FOR NAUSEA AND VOMITING Patient not taking: Reported on 11/15/2023 07/24/23   Douglass Kenney NOVAK, FNP    Physical Exam: Vitals:   11/15/23 1315 11/15/23 1330 11/15/23 1345 11/15/23 1400  BP: 107/61 115/64 133/68 131/68  Pulse: 98 97 (!) 111 97  Resp: (!) 28 (!) 27 16 18   Temp:    98.6 F (37 C)  TempSrc:      SpO2: 93% 93% 92% 96%  Weight:      Height:       General: Alert, oriented x3, resting comfortably in no acute distress Respiratory: Lungs clear to auscultation bilaterally with normal respiratory effort; no w/r/r Cardiovascular: Regular rate and rhythm w/o m/r/g Abdomen: Soft, nontender, nondistended. Positive bowel sounds   Data Reviewed:  Lab Results  Component Value Date   WBC 25.5 (H) 11/15/2023   HGB 13.3 11/15/2023   HCT 41.1 11/15/2023   MCV 93.6 11/15/2023   PLT 255 11/15/2023   Lab Results  Component Value Date   GLUCOSE 122 (H) 11/15/2023   CALCIUM  8.8 (L) 11/15/2023   NA 138 11/15/2023   K 3.9 11/15/2023   CO2 20 (L) 11/15/2023   CL 102 11/15/2023   BUN 24 (H) 11/15/2023   CREATININE 2.04 (H) 11/15/2023   Lab Results  Component Value Date   ALT 33 11/15/2023   AST 41 11/15/2023   ALKPHOS 113 11/15/2023   BILITOT 0.8 11/15/2023   Lab Results  Component Value Date   INR 1.1 08/14/2022   INR 1.11 07/28/2016   INR 0.96 12/01/2012   Radiology: DG C-Arm 1-60 Min-No Report Result Date: 11/15/2023 Fluoroscopy was utilized by the requesting physician.  No radiographic interpretation.   CT L-SPINE NO CHARGE Result Date: 11/15/2023 CLINICAL DATA:  Back pain. EXAM: CT LUMBAR SPINE WITHOUT CONTRAST TECHNIQUE: Multidetector CT imaging of the lumbar spine was performed without  intravenous contrast administration. Multiplanar CT image reconstructions were also generated. RADIATION DOSE REDUCTION: This exam was performed according to the departmental dose-optimization program which includes automated exposure control, adjustment of the mA and/or kV according to patient size and/or use of iterative reconstruction technique. COMPARISON:  None Available. FINDINGS: Segmentation: 5 lumbar type vertebrae. Alignment: Normal. Vertebrae: No acute fracture or focal pathologic process. Paraspinal and other soft tissues: Negative. Disc levels: T12-L1: Unremarkable. L1-2: Mild  posterior broad-based bulging disc. L2-3: Posterior broad-based disc bulge with mild multifactorial central canal stenosis. L3-4: Posterior broad-based bulging disc. Bilateral facet overgrowth with thickening of the ligamentum flavum. Mild to moderate central canal stenosis with mild foraminal encroachment bilaterally, left greater than right. L4-5: Posterior broad-based bulging disc with component of disc uncovering. Facet overgrowth. Moderate central canal multifactorial stenosis with minimal biforaminal encroachment. L5-S1: Posterior broad-based disc osteophyte complex. Patent foramina. IMPRESSION: 1. No acute fracture or subluxation of the lumbar spine. 2. Multilevel degenerative disc disease with varying degrees of multifactorial central canal stenosis at L2-3, L3-4 and L4-5. Electronically Signed   By: Camellia Candle M.D.   On: 11/15/2023 06:44   CT ABDOMEN PELVIS WO CONTRAST Result Date: 11/15/2023 CLINICAL DATA:  Abdominal pain and nausea.  Back pain. EXAM: CT ABDOMEN AND PELVIS WITHOUT CONTRAST TECHNIQUE: Multidetector CT imaging of the abdomen and pelvis was performed following the standard protocol without IV contrast. RADIATION DOSE REDUCTION: This exam was performed according to the departmental dose-optimization program which includes automated exposure control, adjustment of the mA and/or kV according to patient  size and/or use of iterative reconstruction technique. COMPARISON:  CT stone study 10/04/2023 FINDINGS: Lower chest: Dependent atelectasis or scarring in the lung bases. Hepatobiliary: Liver is diffusely decreased in attenuation consistent with steatosis. For hyperattenuating lesions are again identified stable since prior and also comparing back to 06/13/2021. These of been previously characterized as benign hemangiomas. There is no evidence for gallstones, gallbladder wall thickening, or pericholecystic fluid. No intrahepatic or extrahepatic biliary dilation. Pancreas: No focal mass lesion. No dilatation of the main duct. No intraparenchymal cyst. No peripancreatic edema. Spleen: No splenomegaly. No suspicious focal mass lesion. Adrenals/Urinary Tract: No adrenal nodule or mass. Punctate stone identified interpolar right kidney with 5 mm stone in the lower pole right kidney. There is mild to moderate right hydronephrosis with a 10 x 7 x 8 mm stone at the right UPJ (48/3). No right ureteral stone. Adjacent small stones in the lower pole of the left kidney measure up to about 5-6 mm maximum size and are nonobstructing. Left ureter unremarkable. Bladder is decompressed. Gas in the decompressed bladder lumen is presumably secondary to recent instrumentation, but infection could cause this appearance. Stomach/Bowel: Stomach is unremarkable. No gastric wall thickening. No evidence of outlet obstruction. Duodenum is normally positioned as is the ligament of Treitz. No small bowel wall thickening. No small bowel dilatation. The terminal ileum is normal. The appendix is normal. No gross colonic mass. No colonic wall thickening. Diverticular changes are noted in the left colon without evidence of diverticulitis. Vascular/Lymphatic: There is moderate atherosclerotic calcification of the abdominal aorta without aneurysm. There is no gastrohepatic or hepatoduodenal ligament lymphadenopathy. No retroperitoneal or mesenteric  lymphadenopathy. No pelvic sidewall lymphadenopathy. Reproductive: Hysterectomy.  There is no adnexal mass. Other:  No intraperitoneal free fluid. Musculoskeletal: No worrisome lytic or sclerotic osseous abnormality. IMPRESSION: 1. 10 x 7 x 8 mm stone at the right UPJ with mild to moderate right hydronephrosis. 2. Additional bilateral nonobstructing renal stones. 3. Hepatic steatosis. 4. Gas in the decompressed bladder lumen is presumably secondary to recent instrumentation, but bladder infection could cause this appearance. 5.  Aortic Atherosclerosis (ICD10-I70.0). Electronically Signed   By: Camellia Candle M.D.   On: 11/15/2023 06:18    Assessment and Plan: 89F h/o paroxysmal Afib, asthma, diverticulosis, GERD, hyperlipidemia, hypertension , and recent admission in 09/2023 for R hydronephrosis iso nephroureterolithiasis c/b E. Coli bacteremia requiring Urology for ureteral stenting (which pt  subsequently removed) p/w AKI iso R hydronephrosis and CT abd with obstructive renal stone at R UPJ with Urology planning stent placement.  AKI -MIVF: LR at 150cc/h for 24h -Strict I&Os and daily weights (standing preferred) -F/u BMP daily -Renally dose medications for CrCl -Avoid lovenox , NSAIDs, morphine , Fleet's phosphate enema, regular insulin , contrast; no gadolinium for MRI to avoid nephrogenic systemic fibrosis -Consider renal US  and nephrology consult if worsening AKI  R hydronephrosis iso R UPJ renal stone H/o E. Coli bacteremia -Urology consulted; appreciate eval/recs -IV CTX for now given h/o bacteremia  Paroxysmal Afib -PTA Toprol  XL 25mg  daily and Xarelto  20mg  daily  HTN -HOLD pta Hyzaar    Advance Care Planning:   Code Status: Full Code   Consults: Urology  Family Communication: Spouse  Severity of Illness: The appropriate patient status for this patient is INPATIENT. Inpatient status is judged to be reasonable and necessary in order to provide the required intensity of service to  ensure the patient's safety. The patient's presenting symptoms, physical exam findings, and initial radiographic and laboratory data in the context of their chronic comorbidities is felt to place them at high risk for further clinical deterioration. Furthermore, it is not anticipated that the patient will be medically stable for discharge from the hospital within 2 midnights of admission.   * I certify that at the point of admission it is my clinical judgment that the patient will require inpatient hospital care spanning beyond 2 midnights from the point of admission due to high intensity of service, high risk for further deterioration and high frequency of surveillance required.*   ------- I spent 55 minutes reviewing previous notes, at the bedside counseling/discussing the treatment plan, and performing clinical documentation.  Author: Marsha Ada, MD 11/15/2023 2:11 PM  For on call review [email or web address].

## 2023-11-15 NOTE — Progress Notes (Signed)
 New Admission Note:   Arrival Method: bed from PACU Mental Orientation: aa+ox4 Telemetry: n/a Assessment: Completed Skin: c/d/i IV: SL Pain: denies Tubes: n/a Safety Measures: Safety Fall Prevention Plan has been given, discussed and signed Admission: Completed [facility] Orientation: Patient has been orientated to the room, unit and staff.  Family: not present  Orders have been reviewed and implemented. Will continue to monitor the patient. Call light has been placed within reach and bed alarm has been activated.   Doyal Sias, RN

## 2023-11-16 ENCOUNTER — Encounter (HOSPITAL_COMMUNITY): Payer: Self-pay | Admitting: Urology

## 2023-11-16 DIAGNOSIS — N1330 Unspecified hydronephrosis: Secondary | ICD-10-CM | POA: Diagnosis not present

## 2023-11-16 LAB — BLOOD CULTURE ID PANEL (REFLEXED) - BCID2

## 2023-11-16 LAB — BASIC METABOLIC PANEL WITH GFR
Anion gap: 12 (ref 5–15)
BUN: 36 mg/dL — ABNORMAL HIGH (ref 8–23)
CO2: 23 mmol/L (ref 22–32)
Calcium: 8 mg/dL — ABNORMAL LOW (ref 8.9–10.3)
Chloride: 102 mmol/L (ref 98–111)
Creatinine, Ser: 2.14 mg/dL — ABNORMAL HIGH (ref 0.44–1.00)
GFR, Estimated: 26 mL/min — ABNORMAL LOW (ref >60)
Glucose, Bld: 192 mg/dL — ABNORMAL HIGH (ref 70–99)
Potassium: 4.4 mmol/L (ref 3.5–5.1)
Sodium: 137 mmol/L (ref 135–145)

## 2023-11-16 LAB — CBC
HCT: 39.7 % (ref 36.0–46.0)
Hemoglobin: 12.5 g/dL (ref 12.0–15.0)
MCH: 30.2 pg (ref 26.0–34.0)
MCHC: 31.5 g/dL (ref 30.0–36.0)
MCV: 95.9 fL (ref 80.0–100.0)
Platelets: 215 K/uL (ref 150–400)
RBC: 4.14 MIL/uL (ref 3.87–5.11)
RDW: 14.2 % (ref 11.5–15.5)
WBC: 20 K/uL — ABNORMAL HIGH (ref 4.0–10.5)
nRBC: 0 % (ref 0.0–0.2)

## 2023-11-16 MED ORDER — SODIUM CHLORIDE 0.9 % IV SOLN
1.0000 g | Freq: Two times a day (BID) | INTRAVENOUS | Status: DC
  Administered 2023-11-16 – 2023-11-17 (×4): 1 g via INTRAVENOUS
  Filled 2023-11-16 (×5): qty 20

## 2023-11-16 MED ORDER — SENNOSIDES-DOCUSATE SODIUM 8.6-50 MG PO TABS
1.0000 | ORAL_TABLET | Freq: Two times a day (BID) | ORAL | Status: DC
  Administered 2023-11-16 – 2023-11-18 (×4): 1 via ORAL
  Filled 2023-11-16 (×7): qty 1

## 2023-11-16 MED ORDER — LACTATED RINGERS IV SOLN: INTRAVENOUS | Status: AC

## 2023-11-16 MED ORDER — POLYETHYLENE GLYCOL 3350 17 G PO PACK
17.0000 g | PACK | Freq: Every day | ORAL | Status: DC
  Administered 2023-11-16 – 2023-11-18 (×3): 17 g via ORAL
  Filled 2023-11-16 (×5): qty 1

## 2023-11-16 NOTE — Plan of Care (Signed)

## 2023-11-16 NOTE — Progress Notes (Signed)
 PHARMACY - PHYSICIAN COMMUNICATION CRITICAL VALUE ALERT - BLOOD CULTURE IDENTIFICATION (BCID)  [name] is an 63 y.o. female who presented to [facility] on 11/15/2023 with a chief complaint of abdominal pain  Assessment:  GNR 3/4 bottles, BCID = Klebsiella pneumoniae with resistance gene detected (CTX-M ESBL)  Name of physician (or Provider) Contacted: Jillian Buttery, MD   Current antibiotics: ceftriaxone   Changes to prescribed antibiotics recommended:  - Discontinue ceftriaxone   - Initiate meropenem 1g q12h  - Recommendation accepted by provider   Results for orders placed or performed during the hospital encounter of 11/15/23  Blood Culture ID Panel (Reflexed) (Collected: 11/15/2023  4:55 PM)  Result Value Ref Range   Enterococcus faecalis NOT DETECTED NOT DETECTED   Enterococcus Faecium NOT DETECTED NOT DETECTED   Listeria monocytogenes NOT DETECTED NOT DETECTED   Staphylococcus species NOT DETECTED NOT DETECTED   Staphylococcus aureus (BCID) NOT DETECTED NOT DETECTED   Staphylococcus epidermidis NOT DETECTED NOT DETECTED   Staphylococcus lugdunensis NOT DETECTED NOT DETECTED   Streptococcus species NOT DETECTED NOT DETECTED   Streptococcus agalactiae NOT DETECTED NOT DETECTED   Streptococcus pneumoniae NOT DETECTED NOT DETECTED   Streptococcus pyogenes NOT DETECTED NOT DETECTED   A.calcoaceticus-baumannii NOT DETECTED NOT DETECTED   Bacteroides fragilis NOT DETECTED NOT DETECTED   Enterobacterales PENDING NOT DETECTED   Enterobacter cloacae complex NOT DETECTED NOT DETECTED   Escherichia coli NOT DETECTED NOT DETECTED   Klebsiella aerogenes NOT DETECTED NOT DETECTED   Klebsiella oxytoca NOT DETECTED NOT DETECTED   Klebsiella pneumoniae DETECTED (A) NOT DETECTED   Proteus species NOT DETECTED NOT DETECTED   Salmonella species NOT DETECTED NOT DETECTED   Serratia marcescens NOT DETECTED NOT DETECTED   Haemophilus influenzae NOT DETECTED NOT DETECTED   Neisseria  meningitidis NOT DETECTED NOT DETECTED   Pseudomonas aeruginosa NOT DETECTED NOT DETECTED   Stenotrophomonas maltophilia NOT DETECTED NOT DETECTED   Candida albicans NOT DETECTED NOT DETECTED   Candida auris NOT DETECTED NOT DETECTED   Candida glabrata NOT DETECTED NOT DETECTED   Candida krusei NOT DETECTED NOT DETECTED   Candida parapsilosis NOT DETECTED NOT DETECTED   Candida tropicalis NOT DETECTED NOT DETECTED   Cryptococcus neoformans/gattii NOT DETECTED NOT DETECTED   CTX-M ESBL DETECTED (A) NOT DETECTED   Carbapenem resistance IMP NOT DETECTED NOT DETECTED   Carbapenem resistance KPC NOT DETECTED NOT DETECTED   Carbapenem resistance NDM NOT DETECTED NOT DETECTED   Carbapenem resist OXA 48 LIKE NOT DETECTED NOT DETECTED   Carbapenem resistance VIM NOT DETECTED NOT DETECTED    Feliciano Close, PharmD PGY2 Infectious Diseases Pharmacy Resident

## 2023-11-16 NOTE — Progress Notes (Addendum)
 PROGRESS NOTE  [name]  FMW:[id number] DOB: 1960-09-08 DOA: 11/15/2023 PCP: Norleen Lynwood ORN, MD   Brief Narrative: Patient is a 63 year old female with history of paroxysmal A-fib, asthma, diverticulosis, GERD, hyperlipidemia, hypertension, recent admission on 8/25 for right hydronephrosis due to nephroureterolithiasis requiring ureteral stent and complication with E. coli bacteremia presented with abdominal pain, bloating, nausea, vomiting.  On presentation she was tachycardic, tachypneic.  Lab work showed creatinine of 2(baseline creatinine of 1.1), leukocytosis.  CT abdomen is/pelvis showed right-sided hydronephrosis with obstructive ureteral stone at right UPJ.  Urology consulted.  Status post cystoscopy, bilateral retrograde pyelogram, bilateral ureteral stent placement on 10/5.  Blood cultures showing ESBL Klebsiella.Abx switched to meropenem   Assessment & Plan:  Principal Problem:   Hydronephrosis of right kidney  ESBL Klebsiella bacteremia: Culture showing ESBL Klebsiella.  Continue meropenem.  Sepsis  physiology has improved  .Leukocytosis improving.  No fever.  Will discuss with ID after blood culture finalizes  AKI: Baseline creatinine 1.1.  Presented with creatinine range of 2.  Likely from obstructing renal stone. continue IV fluid.  Monitor BMP.  Avoid nephrotoxins.  Right hydronephrosis/obstructive ureteral stone at right UPJ:  Urology consulted.  Status post cystoscopy, bilateral retrograde pyelogram, bilateral ureteral stent placement on 10/5.   Aerobic/anaerobic culture from right renal pelvis showing abundant gram-negative rods.  Continue current antibiotics  Paroxysmal A-fib: Currently on normal sinus rhythm.  On Toprol , Xarelto   Hypertension: Takes Hyzaar  Morbid obesity: BMI of 52        DVT prophylaxis: rivaroxaban  (XARELTO ) tablet 20 mg     Code Status: Full Code  Family Communication: None at bedside  Patient status:Inpatient  Patient is from  :Home  Anticipated discharge un:Ynfz  Estimated DC date:2-3 days   Consultants: Urology  Procedures: Bilateral ureteral stent placement.  Antimicrobials:  Anti-infectives (From admission, onward)    Start     Dose/Rate Route Frequency Ordered Stop   11/16/23 1000  cefTRIAXone  (ROCEPHIN ) 1 g in sodium chloride  0.9 % 100 mL IVPB        1 g 200 mL/hr over 30 Minutes Intravenous Every 24 hours 11/15/23 0927 11/20/23 0959   11/15/23 0745  cefTRIAXone  (ROCEPHIN ) 2 g in sodium chloride  0.9 % 100 mL IVPB        2 g 200 mL/hr over 30 Minutes Intravenous  Once 11/15/23 0730 11/15/23 9165       Subjective: Patient seen and examined at the bedside today.  Hemodynamically stable.  No fever today.  Comfortably sitting on the chair.  Complains of low back pain while passing urine.  Objective: Vitals:   11/15/23 1515 11/15/23 1939 11/16/23 0509 11/16/23 0745  BP: (!) 146/88 139/75 139/86 (!) 140/75  Pulse: 95 91 92 99  Resp: 20 20 18    Temp: 98.1 F (36.7 C) 98.2 F (36.8 C) 98.1 F (36.7 C) 98.3 F (36.8 C)  TempSrc: Oral Oral Oral Oral  SpO2: 95% 97% 96% 96%  Weight:      Height:        Intake/Output Summary (Last 24 hours) at 11/16/2023 0755 Last data filed at 11/16/2023 0600 Gross per 24 hour  Intake 2077.4 ml  Output 700 ml  Net 1377.4 ml   Filed Weights   11/15/23 0401 11/15/23 1156  Weight: 127 kg 127 kg    Examination:  General exam: Overall comfortable, not in distress, morbidly obese HEENT: PERRL Respiratory system:  no wheezes or crackles  Cardiovascular system: S1 & S2 heard, RRR.  Gastrointestinal system: Abdomen is nondistended, soft and nontender. Central nervous system: Alert and oriented Extremities: No edema, no clubbing ,no cyanosis Skin: No rashes, no ulcers,no icterus     Data Reviewed: I have personally reviewed following labs and imaging studies  CBC: Recent Labs  Lab 11/15/23 0405  WBC 25.5*  HGB 13.3  HCT 41.1  MCV 93.6  PLT 255    Basic Metabolic Panel: Recent Labs  Lab 11/15/23 0405  NA 138  K 3.9  CL 102  CO2 20*  GLUCOSE 122*  BUN 24*  CREATININE 2.04*  CALCIUM  8.8*     Recent Results (from the past 240 hours)  Aerobic/Anaerobic Culture w Gram Stain (surgical/deep wound)     Status: None (Preliminary result)   Collection Time: 11/15/23 12:54 PM   Specimen: Path fluid; Body Fluid  Result Value Ref Range Status   Specimen Description FLUID  Final   Special Requests RIGHT RENAL PELVIS CULTURE  Final   Gram Stain   Final    FEW WBC PRESENT, PREDOMINANTLY PMN MODERATE GRAM NEGATIVE RODS    Culture   Final    ABUNDANT GRAM NEGATIVE RODS SUSCEPTIBILITIES TO FOLLOW Performed at [facility], [address], [place], [place] [address]    Report Status PENDING  Incomplete  Blood culture (routine x 2)     Status: None (Preliminary result)   Collection Time: 11/15/23  4:55 PM   Specimen: BLOOD LEFT HAND  Result Value Ref Range Status   Specimen Description BLOOD LEFT HAND  Final   Special Requests   Final    BOTTLES DRAWN AEROBIC AND ANAEROBIC Blood Culture results may not be optimal due to an inadequate volume of blood received in culture bottles Performed at [facility], [address] [place], [place] [address]    Culture PENDING  Incomplete   Report Status PENDING  Incomplete     Radiology Studies: DG C-Arm 1-60 Min-No Report Result Date: 11/15/2023 Fluoroscopy was utilized by the requesting physician.  No radiographic interpretation.   CT L-SPINE NO CHARGE Result Date: 11/15/2023 CLINICAL DATA:  Back pain. EXAM: CT LUMBAR SPINE WITHOUT CONTRAST TECHNIQUE: Multidetector CT imaging of the lumbar spine was performed without intravenous contrast administration. Multiplanar CT image reconstructions were also generated. RADIATION DOSE REDUCTION: This exam was performed according to the departmental dose-optimization program which includes automated exposure control, adjustment of the  mA and/or kV according to patient size and/or use of iterative reconstruction technique. COMPARISON:  None Available. FINDINGS: Segmentation: 5 lumbar type vertebrae. Alignment: Normal. Vertebrae: No acute fracture or focal pathologic process. Paraspinal and other soft tissues: Negative. Disc levels: T12-L1: Unremarkable. L1-2: Mild posterior broad-based bulging disc. L2-3: Posterior broad-based disc bulge with mild multifactorial central canal stenosis. L3-4: Posterior broad-based bulging disc. Bilateral facet overgrowth with thickening of the ligamentum flavum. Mild to moderate central canal stenosis with mild foraminal encroachment bilaterally, left greater than right. L4-5: Posterior broad-based bulging disc with component of disc uncovering. Facet overgrowth. Moderate central canal multifactorial stenosis with minimal biforaminal encroachment. L5-S1: Posterior broad-based disc osteophyte complex. Patent foramina. IMPRESSION: 1. No acute fracture or subluxation of the lumbar spine. 2. Multilevel degenerative disc disease with varying degrees of multifactorial central canal stenosis at L2-3, L3-4 and L4-5. Electronically Signed   By: Camellia Candle M.D.   On: 11/15/2023 06:44   CT ABDOMEN PELVIS WO CONTRAST Result Date: 11/15/2023 CLINICAL DATA:  Abdominal pain and nausea.  Back pain. EXAM: CT ABDOMEN AND PELVIS WITHOUT  CONTRAST TECHNIQUE: Multidetector CT imaging of the abdomen and pelvis was performed following the standard protocol without IV contrast. RADIATION DOSE REDUCTION: This exam was performed according to the departmental dose-optimization program which includes automated exposure control, adjustment of the mA and/or kV according to patient size and/or use of iterative reconstruction technique. COMPARISON:  CT stone study 10/04/2023 FINDINGS: Lower chest: Dependent atelectasis or scarring in the lung bases. Hepatobiliary: Liver is diffusely decreased in attenuation consistent with steatosis. For  hyperattenuating lesions are again identified stable since prior and also comparing back to 06/13/2021. These of been previously characterized as benign hemangiomas. There is no evidence for gallstones, gallbladder wall thickening, or pericholecystic fluid. No intrahepatic or extrahepatic biliary dilation. Pancreas: No focal mass lesion. No dilatation of the main duct. No intraparenchymal cyst. No peripancreatic edema. Spleen: No splenomegaly. No suspicious focal mass lesion. Adrenals/Urinary Tract: No adrenal nodule or mass. Punctate stone identified interpolar right kidney with 5 mm stone in the lower pole right kidney. There is mild to moderate right hydronephrosis with a 10 x 7 x 8 mm stone at the right UPJ (48/3). No right ureteral stone. Adjacent small stones in the lower pole of the left kidney measure up to about 5-6 mm maximum size and are nonobstructing. Left ureter unremarkable. Bladder is decompressed. Gas in the decompressed bladder lumen is presumably secondary to recent instrumentation, but infection could cause this appearance. Stomach/Bowel: Stomach is unremarkable. No gastric wall thickening. No evidence of outlet obstruction. Duodenum is normally positioned as is the ligament of Treitz. No small bowel wall thickening. No small bowel dilatation. The terminal ileum is normal. The appendix is normal. No gross colonic mass. No colonic wall thickening. Diverticular changes are noted in the left colon without evidence of diverticulitis. Vascular/Lymphatic: There is moderate atherosclerotic calcification of the abdominal aorta without aneurysm. There is no gastrohepatic or hepatoduodenal ligament lymphadenopathy. No retroperitoneal or mesenteric lymphadenopathy. No pelvic sidewall lymphadenopathy. Reproductive: Hysterectomy.  There is no adnexal mass. Other:  No intraperitoneal free fluid. Musculoskeletal: No worrisome lytic or sclerotic osseous abnormality. IMPRESSION: 1. 10 x 7 x 8 mm stone at the  right UPJ with mild to moderate right hydronephrosis. 2. Additional bilateral nonobstructing renal stones. 3. Hepatic steatosis. 4. Gas in the decompressed bladder lumen is presumably secondary to recent instrumentation, but bladder infection could cause this appearance. 5.  Aortic Atherosclerosis (ICD10-I70.0). Electronically Signed   By: Camellia Candle M.D.   On: 11/15/2023 06:18    Scheduled Meds:  atorvastatin   20 mg Oral Daily   metoprolol  succinate  25 mg Oral Daily   rivaroxaban   20 mg Oral Q supper   Continuous Infusions:  cefTRIAXone  (ROCEPHIN )  IV       LOS: 1 day   Ivonne Mustache, MD [facility] P10/07/2023, 7:55 AM

## 2023-11-16 NOTE — Progress Notes (Signed)
 1 Day Post-Op Subjective: First time meeting [name].  We reviewed her case and plan.  She is disappointed she formed another stone so quickly after her last lithotripsy, but expressed understanding  Objective: Vital signs in last 24 hours: Temp:  [97.7 F (36.5 C)-98.6 F (37 C)] 98.3 F (36.8 C) (10/06 0745) Pulse Rate:  [91-111] 99 (10/06 0745) Resp:  [16-22] 18 (10/06 0745) BP: (125-146)/(59-88) 140/75 (10/06 0745) SpO2:  [92 %-97 %] 96 % (10/06 0745)  Assessment/Plan: # Right UPJ stone # AKI  To the OR with Dr. Selma on 11/15/2023 for right ureteral stent placement.  Definitive stone management on an outpatient basis when she clears her infection.  Continue to trend labs.  Serum creatinine with small interval increase today.  Quite dehydrated on arrival based on her urinalysis.  Agree with IV fluids  Improvement in leukocytosis.  25.5-20.0.  Afebrile  Remains on broad ABX while awaiting speciation from intraoperative cultures.  ESBL Klebsiella on preliminary results  Intake/Output from previous day: 10/05 0701 - 10/06 0700 In: 2077.4 [P.O.:440; I.V.:1637.4] Out: 700 [Urine:700]  Intake/Output this shift: Total I/O In: 240 [P.O.:240] Out: 900 [Urine:900]  Physical Exam:  General: Alert and oriented CV: No cyanosis Lungs: equal chest rise   Lab Results: Recent Labs    11/15/23 0405 11/16/23 0945  HGB 13.3 12.5  HCT 41.1 39.7   BMET Recent Labs    11/15/23 0405 11/16/23 0945  NA 138 137  K 3.9 4.4  CL 102 102  CO2 20* 23  GLUCOSE 122* 192*  BUN 24* 36*  CREATININE 2.04* 2.14*  CALCIUM  8.8* 8.0*  HGB 13.3 12.5  WBC 25.5* 20.0*     Studies/Results: DG C-Arm 1-60 Min-No Report Result Date: 11/15/2023 Fluoroscopy was utilized by the requesting physician.  No radiographic interpretation.   CT L-SPINE NO CHARGE Result Date: 11/15/2023 CLINICAL DATA:  Back pain. EXAM: CT LUMBAR SPINE WITHOUT CONTRAST TECHNIQUE: Multidetector CT imaging of the  lumbar spine was performed without intravenous contrast administration. Multiplanar CT image reconstructions were also generated. RADIATION DOSE REDUCTION: This exam was performed according to the departmental dose-optimization program which includes automated exposure control, adjustment of the mA and/or kV according to patient size and/or use of iterative reconstruction technique. COMPARISON:  None Available. FINDINGS: Segmentation: 5 lumbar type vertebrae. Alignment: Normal. Vertebrae: No acute fracture or focal pathologic process. Paraspinal and other soft tissues: Negative. Disc levels: T12-L1: Unremarkable. L1-2: Mild posterior broad-based bulging disc. L2-3: Posterior broad-based disc bulge with mild multifactorial central canal stenosis. L3-4: Posterior broad-based bulging disc. Bilateral facet overgrowth with thickening of the ligamentum flavum. Mild to moderate central canal stenosis with mild foraminal encroachment bilaterally, left greater than right. L4-5: Posterior broad-based bulging disc with component of disc uncovering. Facet overgrowth. Moderate central canal multifactorial stenosis with minimal biforaminal encroachment. L5-S1: Posterior broad-based disc osteophyte complex. Patent foramina. IMPRESSION: 1. No acute fracture or subluxation of the lumbar spine. 2. Multilevel degenerative disc disease with varying degrees of multifactorial central canal stenosis at L2-3, L3-4 and L4-5. Electronically Signed   By: Camellia Candle M.D.   On: 11/15/2023 06:44   CT ABDOMEN PELVIS WO CONTRAST Result Date: 11/15/2023 CLINICAL DATA:  Abdominal pain and nausea.  Back pain. EXAM: CT ABDOMEN AND PELVIS WITHOUT CONTRAST TECHNIQUE: Multidetector CT imaging of the abdomen and pelvis was performed following the standard protocol without IV contrast. RADIATION DOSE REDUCTION: This exam was performed according to the departmental dose-optimization program which includes automated exposure control, adjustment  of the  mA and/or kV according to patient size and/or use of iterative reconstruction technique. COMPARISON:  CT stone study 10/04/2023 FINDINGS: Lower chest: Dependent atelectasis or scarring in the lung bases. Hepatobiliary: Liver is diffusely decreased in attenuation consistent with steatosis. For hyperattenuating lesions are again identified stable since prior and also comparing back to 06/13/2021. These of been previously characterized as benign hemangiomas. There is no evidence for gallstones, gallbladder wall thickening, or pericholecystic fluid. No intrahepatic or extrahepatic biliary dilation. Pancreas: No focal mass lesion. No dilatation of the main duct. No intraparenchymal cyst. No peripancreatic edema. Spleen: No splenomegaly. No suspicious focal mass lesion. Adrenals/Urinary Tract: No adrenal nodule or mass. Punctate stone identified interpolar right kidney with 5 mm stone in the lower pole right kidney. There is mild to moderate right hydronephrosis with a 10 x 7 x 8 mm stone at the right UPJ (48/3). No right ureteral stone. Adjacent small stones in the lower pole of the left kidney measure up to about 5-6 mm maximum size and are nonobstructing. Left ureter unremarkable. Bladder is decompressed. Gas in the decompressed bladder lumen is presumably secondary to recent instrumentation, but infection could cause this appearance. Stomach/Bowel: Stomach is unremarkable. No gastric wall thickening. No evidence of outlet obstruction. Duodenum is normally positioned as is the ligament of Treitz. No small bowel wall thickening. No small bowel dilatation. The terminal ileum is normal. The appendix is normal. No gross colonic mass. No colonic wall thickening. Diverticular changes are noted in the left colon without evidence of diverticulitis. Vascular/Lymphatic: There is moderate atherosclerotic calcification of the abdominal aorta without aneurysm. There is no gastrohepatic or hepatoduodenal ligament lymphadenopathy. No  retroperitoneal or mesenteric lymphadenopathy. No pelvic sidewall lymphadenopathy. Reproductive: Hysterectomy.  There is no adnexal mass. Other:  No intraperitoneal free fluid. Musculoskeletal: No worrisome lytic or sclerotic osseous abnormality. IMPRESSION: 1. 10 x 7 x 8 mm stone at the right UPJ with mild to moderate right hydronephrosis. 2. Additional bilateral nonobstructing renal stones. 3. Hepatic steatosis. 4. Gas in the decompressed bladder lumen is presumably secondary to recent instrumentation, but bladder infection could cause this appearance. 5.  Aortic Atherosclerosis (ICD10-I70.0). Electronically Signed   By: Camellia Candle M.D.   On: 11/15/2023 06:18      LOS: 1 day   Ole Bourdon, NP [facility] Pager: [phone]  11/16/2023, 1:32 PM

## 2023-11-17 DIAGNOSIS — N1330 Unspecified hydronephrosis: Secondary | ICD-10-CM | POA: Diagnosis not present

## 2023-11-17 DIAGNOSIS — N390 Urinary tract infection, site not specified: Secondary | ICD-10-CM

## 2023-11-17 DIAGNOSIS — A4150 Gram-negative sepsis, unspecified: Secondary | ICD-10-CM

## 2023-11-17 DIAGNOSIS — D72829 Elevated white blood cell count, unspecified: Secondary | ICD-10-CM

## 2023-11-17 DIAGNOSIS — B961 Klebsiella pneumoniae [K. pneumoniae] as the cause of diseases classified elsewhere: Secondary | ICD-10-CM

## 2023-11-17 LAB — CBC
HCT: 39.1 % (ref 36.0–46.0)
Hemoglobin: 12.3 g/dL (ref 12.0–15.0)
MCH: 30.4 pg (ref 26.0–34.0)
MCHC: 31.5 g/dL (ref 30.0–36.0)
MCV: 96.5 fL (ref 80.0–100.0)
Platelets: 201 K/uL (ref 150–400)
RBC: 4.05 MIL/uL (ref 3.87–5.11)
RDW: 14.3 % (ref 11.5–15.5)
WBC: 13.8 K/uL — ABNORMAL HIGH (ref 4.0–10.5)
nRBC: 0 % (ref 0.0–0.2)

## 2023-11-17 LAB — BASIC METABOLIC PANEL WITH GFR
Anion gap: 8 (ref 5–15)
BUN: 37 mg/dL — ABNORMAL HIGH (ref 8–23)
CO2: 26 mmol/L (ref 22–32)
Calcium: 8.3 mg/dL — ABNORMAL LOW (ref 8.9–10.3)
Chloride: 104 mmol/L (ref 98–111)
Creatinine, Ser: 1.67 mg/dL — ABNORMAL HIGH (ref 0.44–1.00)
GFR, Estimated: 34 mL/min — ABNORMAL LOW (ref >60)
Glucose, Bld: 120 mg/dL — ABNORMAL HIGH (ref 70–99)
Potassium: 4.2 mmol/L (ref 3.5–5.1)
Sodium: 138 mmol/L (ref 135–145)

## 2023-11-17 MED ORDER — PROCHLORPERAZINE EDISYLATE 10 MG/2ML IJ SOLN
5.0000 mg | Freq: Once | INTRAMUSCULAR | Status: AC
  Administered 2023-11-17: 5 mg via INTRAVENOUS
  Filled 2023-11-17: qty 2

## 2023-11-17 MED ORDER — OXYBUTYNIN CHLORIDE 5 MG PO TABS
5.0000 mg | ORAL_TABLET | Freq: Three times a day (TID) | ORAL | Status: DC | PRN
  Administered 2023-11-17 – 2023-11-19 (×4): 5 mg via ORAL
  Filled 2023-11-17 (×6): qty 1

## 2023-11-17 NOTE — Progress Notes (Signed)
   2 Days Post-Op Subjective: No acute events overnight.  [name] was mildly frustrated because she is having some burning with urination following instrumentation, and was having to urinate frequently due to IV rehydration.  Objective: Vital signs in last 24 hours: Temp:  [97.9 F (36.6 C)-98.7 F (37.1 C)] 98.7 F (37.1 C) (10/07 0736) Pulse Rate:  [82-107] 107 (10/07 0736) Resp:  [18-20] 19 (10/07 0736) BP: (118-154)/(66-98) 130/66 (10/07 0736) SpO2:  [97 %] 97 % (10/07 0736)  Assessment/Plan: # Right UPJ stone # AKI  To the OR with Dr. Selma on 11/15/2023 for right ureteral stent placement.  Definitive stone management on an outpatient basis when she clears her infection.  Interval improvement in serum creatinine.  Should continue to normalize with good fluid intake.  Reviewed sticking with water and avoiding dehydrating sugary beverages.  Significant improvement in leukocytosis overnight.  Patient remains afebrile  Remains on broad ABX while awaiting speciation from intraoperative cultures.  ESBL Klebsiella on preliminary results.  Colonies reintubated for better growth.  Still awaiting sensitivities.  Patient wishes to stay in hospital for an additional day due to not feeling well this morning.  She should not need any further interventions from a urologic perspective while she is in hospital.  Okay for discharge once medically clear.  Please call with questions.  Intake/Output from previous day: 10/06 0701 - 10/07 0700 In: 2391.1 [P.O.:480; I.V.:1711.1; IV Piggyback:200] Out: 900 [Urine:900]  Intake/Output this shift: No intake/output data recorded.  Physical Exam:  General: Alert and oriented CV: No cyanosis Lungs: equal chest rise   Lab Results: Recent Labs    11/15/23 0405 11/16/23 0945 11/17/23 0238  HGB 13.3 12.5 12.3  HCT 41.1 39.7 39.1   BMET Recent Labs    11/16/23 0945 11/17/23 0238  NA 137 138  K 4.4 4.2  CL 102 104  CO2 23 26  GLUCOSE  192* 120*  BUN 36* 37*  CREATININE 2.14* 1.67*  CALCIUM  8.0* 8.3*  HGB 12.5 12.3  WBC 20.0* 13.8*     Studies/Results: DG C-Arm 1-60 Min-No Report Result Date: 11/15/2023 Fluoroscopy was utilized by the requesting physician.  No radiographic interpretation.      LOS: 2 days   Ole Bourdon, NP [facility] Pager: [phone]  11/17/2023, 8:55 AM

## 2023-11-17 NOTE — Plan of Care (Signed)
   Problem: Health Behavior/Discharge Planning: Goal: Ability to manage health-related needs will improve Outcome: Completed/Met

## 2023-11-17 NOTE — TOC CM/SW Note (Signed)
 Transition of Care [facility]) - Inpatient Brief Assessment   Patient Details  Name: [name] MRN: [id number] Date of Birth: 12/04/1960  Transition of Care [facility]) CM/SW Contact:    [name], Susan Muskrat, RN Phone Number: 11/17/2023, 1:01 PM   Clinical Narrative:  Patient presented to the ED with Abdominal pain, Nausea, Bloating and Rt upper Leg pain.   Labs showed Creatinine 2.04 and WBC 25.5. Patient was recently admitted on 08/25 with Rt Hydronephrosis with Stent placement by Urology.  CT Abdomen/Pelvis on 11/15/2023 showed 10 mm Rt UPJ with moderate Rt Hydronephrosis with several additional bilateral nonobstructing stones.  Patient underwent Cystoscopy, bilateral Retrograde Pyelogram, bilateral Ureteral Stent placement on 11/15/23 by Urology. Blood cultures showing ESBL Klebsiella on preliminary results. Continues on IV abx.  CM spoke with patient and husband, [name] at bedside about needs for post hospital transition. Patient lives with her husband, has one supportive son. Independent with care and drive self. Currently employed. Has a cane, walker and shower seat at home.  PCP is [name] ORN, MD and uses [facility] on [address] Patient requested a rolator, order placed called in to [employer], Zachary will deliver to patient at beside.    Patient not Medically ready for discharge.  CM will continue to follow as patient progresses with care towards discharge.                 Transition of Care Asessment: Insurance and Status: Insurance coverage has been reviewed Patient has primary care physician: Yes Home environment has been reviewed: Yes Prior level of function:: Modified independent Prior/Current Home Services: No current home services Social Drivers of Health Review: SDOH reviewed no interventions necessary Readmission risk has been reviewed: Yes Transition of care needs: no transition of care needs at this time

## 2023-11-17 NOTE — Plan of Care (Signed)
 Please see shift progressive note about this Ms. [name].   Genice Kimberlin Charity fundraiser, Scientist, research (physical sciences)

## 2023-11-17 NOTE — Progress Notes (Signed)
 PROGRESS NOTE  [name]  FMW:[id number] DOB: 03/03/1960 DOA: 11/15/2023 PCP: Norleen Lynwood ORN, MD   Brief Narrative: Patient is a 63 year old female with history of paroxysmal A-fib, asthma, diverticulosis, GERD, hyperlipidemia, hypertension, recent admission on 8/25 for right hydronephrosis due to nephroureterolithiasis requiring ureteral stent and complication with E. coli bacteremia presented with abdominal pain, bloating, nausea, vomiting.  On presentation she was tachycardic, tachypneic.  Lab work showed creatinine of 2(baseline creatinine of 1.1), leukocytosis.  CT abdomen is/pelvis showed right-sided hydronephrosis with obstructive ureteral stone at right UPJ.  Urology consulted.  Status post cystoscopy, bilateral retrograde pyelogram, bilateral ureteral stent placement on 10/5.  Blood cultures showing ESBL Klebsiella.Abx switched to meropenem.ID also consulted   Assessment & Plan:  Principal Problem:   Hydronephrosis of right kidney  ESBL Klebsiella bacteremia/ESBL E. coli: Blood Culture showing ESBL Klebsiella.  Aerobic/anaerobic culture from right renal pelvis shows ESBL E. Coli.  Urine culture shows E. coli. Continue meropenem.  Sepsis  physiology has improved  .Leukocytosis improving.  No fever.  Consulted ID.   AKI: Baseline creatinine 1.1.  Presented with creatinine range of 2.  Likely from obstructing renal stone. Improved with iv fluid  Right hydronephrosis/obstructive ureteral stone at right UPJ:  Urology consulted.  Status post cystoscopy, bilateral retrograde pyelogram, bilateral ureteral stent placement on 10/5.   Continue current antibiotics  Paroxysmal A-fib: Currently on normal sinus rhythm.  On Toprol , Xarelto   Hypertension: Takes Hyzaar  Morbid obesity: BMI of 52        DVT prophylaxis: rivaroxaban  (XARELTO ) tablet 20 mg     Code Status: Full Code  Family Communication: None at bedside  Patient status:Inpatient  Patient is from :Home  Anticipated  discharge un:[facility]  Estimated DC date:2-3 days   Consultants: Urology,ID  Procedures: Bilateral ureteral stent placement.  Antimicrobials:  Anti-infectives (From admission, onward)    Start     Dose/Rate Route Frequency Ordered Stop   11/16/23 1145  meropenem (MERREM) 1 g in sodium chloride  0.9 % 100 mL IVPB        1 g 200 mL/hr over 30 Minutes Intravenous Every 12 hours 11/16/23 1045     11/16/23 1000  cefTRIAXone  (ROCEPHIN ) 1 g in sodium chloride  0.9 % 100 mL IVPB  Status:  Discontinued        1 g 200 mL/hr over 30 Minutes Intravenous Every 24 hours 11/15/23 0927 11/16/23 1045   11/15/23 0745  cefTRIAXone  (ROCEPHIN ) 2 g in sodium chloride  0.9 % 100 mL IVPB        2 g 200 mL/hr over 30 Minutes Intravenous  Once 11/15/23 0730 11/15/23 0834       Subjective: Patient seen and examined the bedside today.  Overall comfortable.  No fever.  Complains of some pain at the end of urination but not severe.  No abdomen pain, nausea or vomiting  Objective: Vitals:   11/16/23 1704 11/16/23 2020 11/17/23 0524 11/17/23 0736  BP: 138/81 118/69 (!) 154/98 130/66  Pulse: 82 95 95 (!) 107  Resp:  20 18 19   Temp: 98.4 F (36.9 C) 98.3 F (36.8 C) 97.9 F (36.6 C) 98.7 F (37.1 C)  TempSrc: Oral Oral Oral Oral  SpO2: 97% 97% 97% 97%  Weight:      Height:        Intake/Output Summary (Last 24 hours) at 11/17/2023 1123 Last data filed at 11/17/2023 0600 Gross per 24 hour  Intake 2271.13 ml  Output 0 ml  Net 2271.13 ml  Filed Weights   11/15/23 0401 11/15/23 1156  Weight: 127 kg 127 kg    Examination:  General exam: Overall comfortable, not in distress, morbidly obese HEENT: PERRL Respiratory system:  no wheezes or crackles  Cardiovascular system: S1 & S2 heard, RRR.  Gastrointestinal system: Abdomen is nondistended, soft and nontender. Central nervous system: Alert and oriented Extremities: No edema, no clubbing ,no cyanosis Skin: No rashes, no ulcers,no icterus        Data Reviewed: I have personally reviewed following labs and imaging studies  CBC: Recent Labs  Lab 11/15/23 0405 11/16/23 0945 11/17/23 0238  WBC 25.5* 20.0* 13.8*  HGB 13.3 12.5 12.3  HCT 41.1 39.7 39.1  MCV 93.6 95.9 96.5  PLT 255 215 201   Basic Metabolic Panel: Recent Labs  Lab 11/15/23 0405 11/16/23 0945 11/17/23 0238  NA 138 137 138  K 3.9 4.4 4.2  CL 102 102 104  CO2 20* 23 26  GLUCOSE 122* 192* 120*  BUN 24* 36* 37*  CREATININE 2.04* 2.14* 1.67*  CALCIUM  8.8* 8.0* 8.3*     Recent Results (from the past 240 hours)  Urine Culture     Status: Abnormal (Preliminary result)   Collection Time: 11/15/23  7:31 AM   Specimen: Urine, Clean Catch  Result Value Ref Range Status   Specimen Description URINE, CLEAN CATCH  Final   Special Requests NONE  Final   Culture (A)  Final    >=100,000 COLONIES/mL ESCHERICHIA COLI CULTURE REINCUBATED FOR BETTER GROWTH Performed at [facility], [address] [place], [place] [address]    Report Status PENDING  Incomplete  Aerobic/Anaerobic Culture w Gram Stain (surgical/deep wound)     Status: None (Preliminary result)   Collection Time: 11/15/23 12:54 PM   Specimen: Path fluid; Body Fluid  Result Value Ref Range Status   Specimen Description FLUID  Final   Special Requests RIGHT RENAL PELVIS CULTURE  Final   Gram Stain   Final    FEW WBC PRESENT, PREDOMINANTLY PMN MODERATE GRAM NEGATIVE RODS Performed at [facility], [address] [place], [place] [address]    Culture   Final    ABUNDANT ESCHERICHIA COLI Confirmed Extended Spectrum Beta-Lactamase Producer (ESBL).  In bloodstream infections from ESBL organisms, carbapenems are preferred over piperacillin /tazobactam. They are shown to have a lower risk of mortality.    Report Status PENDING  Incomplete   Organism ID, Bacteria ESCHERICHIA COLI  Final      Susceptibility   Escherichia coli - MIC*    AMPICILLIN >=32 RESISTANT Resistant     CEFAZOLIN   (NON-URINE) >=32 RESISTANT Resistant     CEFEPIME  16 RESISTANT Resistant     ERTAPENEM <=0.12 SENSITIVE Sensitive     CEFTRIAXONE  >=64 RESISTANT Resistant     CIPROFLOXACIN  <=0.06 SENSITIVE Sensitive     GENTAMICIN 8 INTERMEDIATE Intermediate     MEROPENEM <=0.25 SENSITIVE Sensitive     TRIMETH/SULFA <=20 SENSITIVE Sensitive     AMPICILLIN/SULBACTAM 16 INTERMEDIATE Intermediate     PIP/TAZO Value in next row Sensitive      <=4 SENSITIVEThis is a modified FDA-approved test that has been validated and its performance characteristics determined by the reporting laboratory.  This laboratory is certified under the Clinical Laboratory Improvement Amendments CLIA as qualified to perform high complexity clinical laboratory testing.    * ABUNDANT ESCHERICHIA COLI  Blood culture (routine x 2)     Status: None (Preliminary result)   Collection Time: 11/15/23  4:46  PM   Specimen: BLOOD LEFT ARM  Result Value Ref Range Status   Specimen Description BLOOD LEFT ARM  Final   Special Requests   Final    BOTTLES DRAWN AEROBIC AND ANAEROBIC Blood Culture results may not be optimal due to an inadequate volume of blood received in culture bottles   Culture  Setup Time   Final    GRAM NEGATIVE RODS AEROBIC BOTTLE ONLY CRITICAL VALUE NOTED.  VALUE IS CONSISTENT WITH PREVIOUSLY REPORTED AND CALLED VALUE. Performed at [facility], [address] [place], [place] [address]    Culture GRAM NEGATIVE RODS  Final   Report Status PENDING  Incomplete  Blood culture (routine x 2)     Status: Abnormal (Preliminary result)   Collection Time: 11/15/23  4:55 PM   Specimen: BLOOD LEFT HAND  Result Value Ref Range Status   Specimen Description BLOOD LEFT HAND  Final   Special Requests   Final    BOTTLES DRAWN AEROBIC AND ANAEROBIC Blood Culture results may not be optimal due to an inadequate volume of blood received in culture bottles   Culture  Setup Time   Final    GRAM NEGATIVE RODS IN BOTH AEROBIC AND  ANAEROBIC BOTTLES CRITICAL RESULT CALLED TO, READ BACK BY AND VERIFIED WITH: PHARMD B WANNARAT [id number] AT 1037 BY CM    Culture (A)  Final    KLEBSIELLA PNEUMONIAE SUSCEPTIBILITIES TO FOLLOW Performed at [facility], [address], [place], [place] [address]    Report Status PENDING  Incomplete  Blood Culture ID Panel (Reflexed)     Status: Abnormal   Collection Time: 11/15/23  4:55 PM  Result Value Ref Range Status   Enterococcus faecalis NOT DETECTED NOT DETECTED Final   Enterococcus Faecium NOT DETECTED NOT DETECTED Final   Listeria monocytogenes NOT DETECTED NOT DETECTED Final   Staphylococcus species NOT DETECTED NOT DETECTED Final   Staphylococcus aureus (BCID) NOT DETECTED NOT DETECTED Final   Staphylococcus epidermidis NOT DETECTED NOT DETECTED Final   Staphylococcus lugdunensis NOT DETECTED NOT DETECTED Final   Streptococcus species NOT DETECTED NOT DETECTED Final   Streptococcus agalactiae NOT DETECTED NOT DETECTED Final   Streptococcus pneumoniae NOT DETECTED NOT DETECTED Final   Streptococcus pyogenes NOT DETECTED NOT DETECTED Final   A.calcoaceticus-baumannii NOT DETECTED NOT DETECTED Final   Bacteroides fragilis NOT DETECTED NOT DETECTED Final   Enterobacterales DETECTED (A) NOT DETECTED Final    Comment: CRITICAL RESULT CALLED TO, READ BACK BY AND VERIFIED WITH: PHARMD B WANNARAT [id number] AT 1037 BY CM    Enterobacter cloacae complex NOT DETECTED NOT DETECTED Final   Escherichia coli NOT DETECTED NOT DETECTED Final   Klebsiella aerogenes NOT DETECTED NOT DETECTED Final   Klebsiella oxytoca NOT DETECTED NOT DETECTED Final   Klebsiella pneumoniae DETECTED (A) NOT DETECTED Final    Comment: CRITICAL RESULT CALLED TO, READ BACK BY AND VERIFIED WITH: PHARMD B WANNARAT [id number] AT 1037 BY CM    Proteus species NOT DETECTED NOT DETECTED Final   Salmonella species NOT DETECTED NOT DETECTED Final   Serratia marcescens NOT DETECTED NOT DETECTED Final   Haemophilus  influenzae NOT DETECTED NOT DETECTED Final   Neisseria meningitidis NOT DETECTED NOT DETECTED Final   Pseudomonas aeruginosa NOT DETECTED NOT DETECTED Final   Stenotrophomonas maltophilia NOT DETECTED NOT DETECTED Final   Candida albicans NOT DETECTED NOT DETECTED Final   Candida auris NOT DETECTED NOT DETECTED Final   Candida glabrata NOT DETECTED NOT DETECTED  Final   Candida krusei NOT DETECTED NOT DETECTED Final   Candida parapsilosis NOT DETECTED NOT DETECTED Final   Candida tropicalis NOT DETECTED NOT DETECTED Final   Cryptococcus neoformans/gattii NOT DETECTED NOT DETECTED Final   CTX-M ESBL DETECTED (A) NOT DETECTED Final    Comment: CRITICAL RESULT CALLED TO, READ BACK BY AND VERIFIED WITH: PHARMD B WANNARAT [id number] AT 1037 BY CM (NOTE) Extended spectrum beta-lactamase detected. Recommend a carbapenem as initial therapy.      Carbapenem resistance IMP NOT DETECTED NOT DETECTED Final   Carbapenem resistance KPC NOT DETECTED NOT DETECTED Final   Carbapenem resistance NDM NOT DETECTED NOT DETECTED Final   Carbapenem resist OXA 48 LIKE NOT DETECTED NOT DETECTED Final   Carbapenem resistance VIM NOT DETECTED NOT DETECTED Final    Comment: Performed at [facility], [address], [place], [place] [address]     Radiology Studies: DG C-Arm 1-60 Min-No Report Result Date: 11/15/2023 Fluoroscopy was utilized by the requesting physician.  No radiographic interpretation.    Scheduled Meds:  atorvastatin   20 mg Oral Daily   metoprolol  succinate  25 mg Oral Daily   polyethylene glycol  17 g Oral Daily   rivaroxaban   20 mg Oral Q supper   senna-docusate  1 tablet Oral BID   Continuous Infusions:  meropenem (MERREM) IV 1 g (11/17/23 0939)     LOS: 2 days   Ivonne Mustache, MD [facility] P10/08/2023, 11:23 AM

## 2023-11-17 NOTE — Consult Note (Signed)
 [facility]   Reason for Consult: Antimicrobial management for ESBL Klebsiella sepsis    Referring Provider: Dr. Jillian  63 year old lady with history of paroxysmal A-fib, asthma, GERD, diverticulosis, hyperlipidemia, hypertension, right hydronephrosis, obstructive nephroureterolithiasis, status post ureteral stent and removal presented to the emergency room at [facility] on 11/15/2023 with complaints of discomfort in the stoma, bloating sensation, pain in the back as well as pain in the right upper leg.  Patient also had some nausea.  She was not able to keep her food down.  She also had some vomiting.  Patient was concerned since she had similar symptoms in August and was admitted with diagnosis of sepsis. On arrival to the emergency room her white count was about 25.5, she was tachycardic and tachypneic.  She also had AKI.  Her creatinine was about 2.04. CT scan abdomen pelvis revealed mild to moderate right hydronephrosis with 10 x 7 x 8 mm stone at the right UPJ.  Patient was admitted with a diagnosis of sepsis secondary to UTI.  She was placed on IV ceftriaxone  and admitted under the hospitalist service. Patient was evaluated by urology.  She underwent cystoscopy and bilateral ureteral stent placement on 11/15/2023. The blood cultures have grown ESBL Klebsiella pneumonia.  Patient's antibiotics have been switched over to IV meropenem.  ID consult has been called regarding antimicrobial management.   Assessment:  Sepsis, resolved. ESBL Klebsiella pneumoniae bacteremia. E. coli UTI. Obstructive nephrolithiasis on the right side. Right hydronephrosis. Status post cystoscopy with bilateral ureteral stent placement on 11/15/2023, postop day #2. Leukocytosis, trending down.      Component     Latest Ref Rng 11/15/2023 11/16/2023 11/17/2023  WBC     4.0 - 10.5 K/uL 25.5 (H)  20.0 (H)  13.8 (H)      AKI, improved.   Component     Latest Ref Rng  11/15/2023 11/16/2023 11/17/2023  Creatinine     0.44 - 1.00 mg/dL 7.95 (H)  7.85 (H)  8.32 (H)      Patient has no acute complaints today.  Plan: Continue IV meropenem 1 g Q12. Recommend at least 2 weeks of IV antibiotics from tomorrow as patient has bilateral stents. Repeat UA, urine culture and blood cultures x 2 tomorrow. Monitor acute phase reactants ESR, CRP.  Principal Problem:   Hydronephrosis of right kidney    atorvastatin   20 mg Oral Daily   metoprolol  succinate  25 mg Oral Daily   polyethylene glycol  17 g Oral Daily   rivaroxaban   20 mg Oral Q supper   senna-docusate  1 tablet Oral BID    HPI:  63 year old lady with history of paroxysmal A-fib, asthma, GERD, diverticulosis, hyperlipidemia, hypertension, right hydronephrosis, obstructive nephroureterolithiasis, status post ureteral stent and removal presented to the emergency room at [facility] on 11/15/2023 with complaints of discomfort in the stoma, bloating sensation, pain in the back as well as pain in the right upper leg.  Patient also had some nausea.  She was not able to keep her food down.  She also had some vomiting.  Patient was concerned since she had similar symptoms in August and was admitted with diagnosis of sepsis. On arrival to the emergency room her white count was about 25.5, she was tachycardic and tachypneic.  She also had AKI.  Her creatinine was about 2.04. CT scan abdomen pelvis revealed mild to moderate right hydronephrosis with 10 x 7 x 8 mm  stone at the right UPJ.  Patient was admitted with a diagnosis of sepsis secondary to UTI.  She was placed on IV ceftriaxone  and admitted under the hospitalist service. Patient was evaluated by urology.  She underwent cystoscopy and bilateral ureteral stent placement on 11/15/2023. The blood cultures have grown ESBL Klebsiella pneumonia.  Patient's antibiotics have been switched over to IV meropenem.  ID consult has been called regarding antimicrobial  management.   Review of Systems: Review of Systems  Constitutional: Negative.   HENT: Negative.    Eyes: Negative.   Respiratory: Negative.    Cardiovascular: Negative.   Gastrointestinal: Negative.   Genitourinary: Negative.   Musculoskeletal: Negative.   Skin: Negative.   Neurological: Negative.   Endo/Heme/Allergies: Negative.   Psychiatric/Behavioral: Negative.      Past Medical History:  Diagnosis Date   Abscess of bladder 07/28/2016   Alcohol dependence (HCC)    Allergic rhinitis 12/06/2013   Anxiety    Asthma 03/16/2015   Atrial flutter with rapid ventricular response (HCC) 04/01/2020   COLONIC POLYPS, HX OF 04/05/2010   COVID-19 09/2023   DIVERTICULITIS, HX OF 04/05/2010   DJD (degenerative joint disease)    right knee, mot to severe   Dysrhythmia    Fatty liver    GERD (gastroesophageal reflux disease)    no meds   Heart murmur    hx of    Hemorrhoids    History of kidney stones    Hyperlipidemia    Hypertension    Impaired glucose tolerance 12/06/2013   Morbid obesity with BMI of 50.0-59.9, adult (HCC)    Nausea & vomiting 05/24/2022   Peripheral vascular disease    Pulmonary embolism (HCC)     Social History   Tobacco Use   Smoking status: Former    Current packs/day: 0.00    Average packs/day: 0.5 packs/day for 7.0 years (3.5 ttl pk-yrs)    Types: Cigarettes    Start date: 02/11/1976    Quit date: 02/11/1983    Years since quitting: 40.7    Passive exposure: Never   Smokeless tobacco: Never  Vaping Use   Vaping status: Never Used  Substance Use Topics   Alcohol use: Yes    Alcohol/week: 1.0 standard drink of alcohol    Types: 1 Shots of liquor per week    Comment: daily half pint   Drug use: Not Currently    Types: Marijuana    Comment: smoked marijuana x 10 years.  Quit in 1985.    Family History  Problem Relation Age of Onset   Stroke Mother    COPD Father    Lymphoma Sister    Alcoholism Sister    Allergies  Allergen  Reactions   Morphine  Itching   Shrimp [Shellfish Allergy] Itching and Other (See Comments)    Tongue burns also   Tramadol  Other (See Comments)    Caused confusion   Covid-19 (Mrna) Vaccine Hives   Diltiazem  Hcl Itching    Pt with itching of the feet when bolus given   Latex Rash    OBJECTIVE: Blood pressure 130/66, pulse (!) 107, temperature 98.7 F (37.1 C), temperature source Oral, resp. rate 19, height 5' 1.5 (1.562 m), weight 127 kg, SpO2 97%.  Physical Exam Constitutional:      Appearance: She is obese.  HENT:     Head: Normocephalic and atraumatic.  Eyes:     Extraocular Movements: Extraocular movements intact.  Cardiovascular:     Rate and Rhythm: Normal rate  and regular rhythm.     Heart sounds: Normal heart sounds.  Abdominal:     General: Abdomen is flat. Bowel sounds are normal.     Palpations: Abdomen is soft.  Genitourinary:    Uterus: Normal.   Skin:    General: Skin is warm and dry.  Neurological:     Mental Status: She is alert.     Lab Results Lab Results  Component Value Date   WBC 13.8 (H) 11/17/2023   HGB 12.3 11/17/2023   HCT 39.1 11/17/2023   MCV 96.5 11/17/2023   PLT 201 11/17/2023    Lab Results  Component Value Date   CREATININE 1.67 (H) 11/17/2023   BUN 37 (H) 11/17/2023   NA 138 11/17/2023   K 4.2 11/17/2023   CL 104 11/17/2023   CO2 26 11/17/2023    Lab Results  Component Value Date   ALT 33 11/15/2023   AST 41 11/15/2023   ALKPHOS 113 11/15/2023   BILITOT 0.8 11/15/2023     Microbiology: Recent Results (from the past 240 hours)  Urine Culture     Status: Abnormal (Preliminary result)   Collection Time: 11/15/23  7:31 AM   Specimen: Urine, Clean Catch  Result Value Ref Range Status   Specimen Description URINE, CLEAN CATCH  Final   Special Requests NONE  Final   Culture (A)  Final    >=100,000 COLONIES/mL ESCHERICHIA COLI SUSCEPTIBILITIES TO FOLLOW Performed at [facility], [address]  [place], [place] [address]    Report Status PENDING  Incomplete  Aerobic/Anaerobic Culture w Gram Stain (surgical/deep wound)     Status: None (Preliminary result)   Collection Time: 11/15/23 12:54 PM   Specimen: Path fluid; Body Fluid  Result Value Ref Range Status   Specimen Description FLUID  Final   Special Requests RIGHT RENAL PELVIS CULTURE  Final   Gram Stain   Final    FEW WBC PRESENT, PREDOMINANTLY PMN MODERATE GRAM NEGATIVE RODS    Culture   Final    ABUNDANT ESCHERICHIA COLI Confirmed Extended Spectrum Beta-Lactamase Producer (ESBL).  In bloodstream infections from ESBL organisms, carbapenems are preferred over piperacillin /tazobactam. They are shown to have a lower risk of mortality. HOLDING FOR POSSIBLE ANAEROBE Performed at [facility], [address], [place], [place] [address]    Report Status PENDING  Incomplete   Organism ID, Bacteria ESCHERICHIA COLI  Final      Susceptibility   Escherichia coli - MIC*    AMPICILLIN >=32 RESISTANT Resistant     CEFAZOLIN  (NON-URINE) >=32 RESISTANT Resistant     CEFEPIME  16 RESISTANT Resistant     ERTAPENEM <=0.12 SENSITIVE Sensitive     CEFTRIAXONE  >=64 RESISTANT Resistant     CIPROFLOXACIN  <=0.06 SENSITIVE Sensitive     GENTAMICIN 8 INTERMEDIATE Intermediate     MEROPENEM <=0.25 SENSITIVE Sensitive     TRIMETH/SULFA <=20 SENSITIVE Sensitive     AMPICILLIN/SULBACTAM 16 INTERMEDIATE Intermediate     PIP/TAZO Value in next row Sensitive      <=4 SENSITIVEThis is a modified FDA-approved test that has been validated and its performance characteristics determined by the reporting laboratory.  This laboratory is certified under the Clinical Laboratory Improvement Amendments CLIA as qualified to perform high complexity clinical laboratory testing.    * ABUNDANT ESCHERICHIA COLI  Blood culture (routine x 2)     Status: Abnormal (Preliminary result)   Collection Time: 11/15/23  4:46 PM   Specimen: BLOOD LEFT ARM  Result Value Ref  Range Status   Specimen Description BLOOD LEFT ARM  Final   Special Requests   Final    BOTTLES DRAWN AEROBIC AND ANAEROBIC Blood Culture results may not be optimal due to an inadequate volume of blood received in culture bottles   Culture  Setup Time   Final    GRAM NEGATIVE RODS AEROBIC BOTTLE ONLY CRITICAL VALUE NOTED.  VALUE IS CONSISTENT WITH PREVIOUSLY REPORTED AND CALLED VALUE. Performed at [facility], [address] [place], [place] [address]    Culture KLEBSIELLA PNEUMONIAE (A)  Final   Report Status PENDING  Incomplete  Blood culture (routine x 2)     Status: Abnormal (Preliminary result)   Collection Time: 11/15/23  4:55 PM   Specimen: BLOOD LEFT HAND  Result Value Ref Range Status   Specimen Description BLOOD LEFT HAND  Final   Special Requests   Final    BOTTLES DRAWN AEROBIC AND ANAEROBIC Blood Culture results may not be optimal due to an inadequate volume of blood received in culture bottles   Culture  Setup Time   Final    GRAM NEGATIVE RODS IN BOTH AEROBIC AND ANAEROBIC BOTTLES CRITICAL RESULT CALLED TO, READ BACK BY AND VERIFIED WITH: PHARMD B WANNARAT [id number] AT 1037 BY CM    Culture (A)  Final    KLEBSIELLA PNEUMONIAE SUSCEPTIBILITIES TO FOLLOW Performed at [facility], [address] [place], [place] [address]    Report Status PENDING  Incomplete  Blood Culture ID Panel (Reflexed)     Status: Abnormal   Collection Time: 11/15/23  4:55 PM  Result Value Ref Range Status   Enterococcus faecalis NOT DETECTED NOT DETECTED Final   Enterococcus Faecium NOT DETECTED NOT DETECTED Final   Listeria monocytogenes NOT DETECTED NOT DETECTED Final   Staphylococcus species NOT DETECTED NOT DETECTED Final   Staphylococcus aureus (BCID) NOT DETECTED NOT DETECTED Final   Staphylococcus epidermidis NOT DETECTED NOT DETECTED Final   Staphylococcus lugdunensis NOT DETECTED NOT DETECTED Final   Streptococcus species NOT DETECTED NOT DETECTED Final    Streptococcus agalactiae NOT DETECTED NOT DETECTED Final   Streptococcus pneumoniae NOT DETECTED NOT DETECTED Final   Streptococcus pyogenes NOT DETECTED NOT DETECTED Final   A.calcoaceticus-baumannii NOT DETECTED NOT DETECTED Final   Bacteroides fragilis NOT DETECTED NOT DETECTED Final   Enterobacterales DETECTED (A) NOT DETECTED Final    Comment: CRITICAL RESULT CALLED TO, READ BACK BY AND VERIFIED WITH: PHARMD B WANNARAT [id number] AT 1037 BY CM    Enterobacter cloacae complex NOT DETECTED NOT DETECTED Final   Escherichia coli NOT DETECTED NOT DETECTED Final   Klebsiella aerogenes NOT DETECTED NOT DETECTED Final   Klebsiella oxytoca NOT DETECTED NOT DETECTED Final   Klebsiella pneumoniae DETECTED (A) NOT DETECTED Final    Comment: CRITICAL RESULT CALLED TO, READ BACK BY AND VERIFIED WITH: PHARMD B WANNARAT [id number] AT 1037 BY CM    Proteus species NOT DETECTED NOT DETECTED Final   Salmonella species NOT DETECTED NOT DETECTED Final   Serratia marcescens NOT DETECTED NOT DETECTED Final   Haemophilus influenzae NOT DETECTED NOT DETECTED Final   Neisseria meningitidis NOT DETECTED NOT DETECTED Final   Pseudomonas aeruginosa NOT DETECTED NOT DETECTED Final   Stenotrophomonas maltophilia NOT DETECTED NOT DETECTED Final   Candida albicans NOT DETECTED NOT DETECTED Final   Candida auris NOT DETECTED NOT DETECTED Final   Candida glabrata NOT DETECTED NOT DETECTED Final   Candida krusei NOT DETECTED NOT  DETECTED Final   Candida parapsilosis NOT DETECTED NOT DETECTED Final   Candida tropicalis NOT DETECTED NOT DETECTED Final   Cryptococcus neoformans/gattii NOT DETECTED NOT DETECTED Final   CTX-M ESBL DETECTED (A) NOT DETECTED Final    Comment: CRITICAL RESULT CALLED TO, READ BACK BY AND VERIFIED WITH: PHARMD B WANNARAT [id number] AT 1037 BY CM (NOTE) Extended spectrum beta-lactamase detected. Recommend a carbapenem as initial therapy.      Carbapenem resistance IMP NOT DETECTED NOT DETECTED  Final   Carbapenem resistance KPC NOT DETECTED NOT DETECTED Final   Carbapenem resistance NDM NOT DETECTED NOT DETECTED Final   Carbapenem resist OXA 48 LIKE NOT DETECTED NOT DETECTED Final   Carbapenem resistance VIM NOT DETECTED NOT DETECTED Final    Comment: Performed at [facility], [address], [place], [place] [address]    Katharine Metro, MD [facility] Cell [phone].  11/17/2023 4:04 PM    Total Encounter Time: 80 minutes.

## 2023-11-17 NOTE — Progress Notes (Signed)
 0715: Nurse to nurse report received from [place], RN. Per report patient requested to wash up. States that IV fluids will need to be restarted upon finishing.  0900: Patient outside of room requesting that her floor is cleaned, EVS notified. Patient updated on positive urin culture. Patient states that she was told that she will atleast be here for another day or so and she is okay with this. Patient is up independently, with a steady gait. Patient states one occurrence of stool.  9072: Precaution signs properly placed.   Morning assessment: Patient endorses dysuria, bladder spasms after emptying, L Temporal HA, LBM 10/7, Increased urgency and frequency of urine, dyspnea with exertion (up and down to the bathroom)  1100: Patient requesting pain medication for HA and abd/flank pain. This nurse to give PRN  1243: Patient up to the restroom. She has been up on multiple occasions. Requesting bladder spasm medications. Dr. Jillian notified, medication ordered.  Patient also requests that this nurse calls her sister to give update. Phone number obtained.  1301: Patient's sister contacted and updated at this time.  Pharmacy messaged about PRN medication at this time   Shift assessment:  Status unchanged. Patient does endorse feeling better overall.

## 2023-11-18 ENCOUNTER — Telehealth: Payer: Self-pay

## 2023-11-18 ENCOUNTER — Other Ambulatory Visit: Payer: Self-pay

## 2023-11-18 DIAGNOSIS — Z960 Presence of urogenital implants: Secondary | ICD-10-CM

## 2023-11-18 DIAGNOSIS — Z1612 Extended spectrum beta lactamase (ESBL) resistance: Secondary | ICD-10-CM

## 2023-11-18 DIAGNOSIS — N200 Calculus of kidney: Secondary | ICD-10-CM

## 2023-11-18 DIAGNOSIS — N1330 Unspecified hydronephrosis: Secondary | ICD-10-CM | POA: Diagnosis not present

## 2023-11-18 DIAGNOSIS — B9629 Other Escherichia coli [E. coli] as the cause of diseases classified elsewhere: Secondary | ICD-10-CM

## 2023-11-18 DIAGNOSIS — R7881 Bacteremia: Secondary | ICD-10-CM

## 2023-11-18 DIAGNOSIS — N390 Urinary tract infection, site not specified: Secondary | ICD-10-CM

## 2023-11-18 LAB — BASIC METABOLIC PANEL WITH GFR
Anion gap: 10 (ref 5–15)
BUN: 29 mg/dL — ABNORMAL HIGH (ref 8–23)
CO2: 26 mmol/L (ref 22–32)
Calcium: 8.4 mg/dL — ABNORMAL LOW (ref 8.9–10.3)
Chloride: 102 mmol/L (ref 98–111)
Creatinine, Ser: 1.26 mg/dL — ABNORMAL HIGH (ref 0.44–1.00)
GFR, Estimated: 48 mL/min — ABNORMAL LOW (ref >60)
Glucose, Bld: 110 mg/dL — ABNORMAL HIGH (ref 70–99)
Potassium: 4.2 mmol/L (ref 3.5–5.1)
Sodium: 138 mmol/L (ref 135–145)

## 2023-11-18 LAB — AEROBIC/ANAEROBIC CULTURE W GRAM STAIN (SURGICAL/DEEP WOUND)

## 2023-11-18 LAB — CBC
HCT: 37.1 % (ref 36.0–46.0)
Hemoglobin: 11.6 g/dL — ABNORMAL LOW (ref 12.0–15.0)
MCH: 30.3 pg (ref 26.0–34.0)
MCHC: 31.3 g/dL (ref 30.0–36.0)
MCV: 96.9 fL (ref 80.0–100.0)
Platelets: 195 K/uL (ref 150–400)
RBC: 3.83 MIL/uL — ABNORMAL LOW (ref 3.87–5.11)
RDW: 14.3 % (ref 11.5–15.5)
WBC: 11.7 K/uL — ABNORMAL HIGH (ref 4.0–10.5)
nRBC: 0 % (ref 0.0–0.2)

## 2023-11-18 LAB — URINE CULTURE: Culture: >=100000 — AB

## 2023-11-18 MED ORDER — ALBUTEROL SULFATE (2.5 MG/3ML) 0.083% IN NEBU
2.5000 mg | INHALATION_SOLUTION | RESPIRATORY_TRACT | Status: DC | PRN
Start: 2023-11-18 — End: 2023-11-18
  Filled 2023-11-18: qty 3

## 2023-11-18 MED ORDER — ALBUTEROL SULFATE (2.5 MG/3ML) 0.083% IN NEBU: 3.0000 mL | INHALATION_SOLUTION | RESPIRATORY_TRACT | Status: DC | PRN

## 2023-11-18 MED ORDER — SODIUM CHLORIDE 0.9 % IV SOLN
1.0000 g | INTRAVENOUS | Status: DC
  Administered 2023-11-18 – 2023-11-19 (×2): 1 g via INTRAVENOUS
  Filled 2023-11-18 (×2): qty 1000

## 2023-11-18 MED ORDER — ALBUTEROL SULFATE (2.5 MG/3ML) 0.083% IN NEBU: 2.5000 mg | INHALATION_SOLUTION | RESPIRATORY_TRACT | Status: DC

## 2023-11-18 MED ORDER — SODIUM CHLORIDE 0.9 % IV SOLN
1.0000 g | Freq: Three times a day (TID) | INTRAVENOUS | Status: DC
  Filled 2023-11-18: qty 20

## 2023-11-18 MED ORDER — ALBUTEROL SULFATE (2.5 MG/3ML) 0.083% IN NEBU: 2.5000 mg | INHALATION_SOLUTION | RESPIRATORY_TRACT | Status: DC | PRN

## 2023-11-18 NOTE — Plan of Care (Signed)
 Please see the RN's shift progress note regarding Ms. Bernadette.   Lucion Dilger

## 2023-11-18 NOTE — TOC Progression Note (Signed)
 Transition of Care [facility]) - Progression Note    Patient Details  Name: [name] MRN: [id number] Date of Birth: 22-Apr-1960  [facility] of [facility]) CM/SW Contact  Tom-Johnson, Cassidy Tashiro Daphne, RN Phone Number: 11/18/2023, 11:28 AM  Clinical Narrative:     CM consulted for Home with IV abx. Patient has no preference, CM called in referral to [facility] and Pam voiced acceptance, will do education with patient tomorrow. CM sent [facility] referral to [facility] and [facility] noted acceptance, info on AVS.  CM will continue to follow as patient progresses with care towards discharge.                     Expected Discharge Plan and Services                                               Social Drivers of Health (SDOH) Interventions SDOH Screenings   Food Insecurity: No Food Insecurity (11/15/2023)  Recent Concern: Food Insecurity - Food Insecurity Present (10/04/2023)  Housing: Low Risk  (11/15/2023)  Transportation Needs: No Transportation Needs (11/15/2023)  Utilities: Not At Risk (11/15/2023)  Depression (PHQ2-9): Low Risk  (08/06/2023)  Financial Resource Strain: Medium Risk (11/07/2021)  Tobacco Use: Medium Risk (11/15/2023)    Readmission Risk Interventions    11/17/2023    1:00 PM 10/06/2023    3:11 PM  Readmission Risk Prevention Plan  Transportation Screening Complete Complete  PCP or Specialist Appt within 5-7 Days Complete   PCP or Specialist Appt within 3-5 Days  Complete  Home Care Screening Complete   Medication Review (RN CM) Referral to Pharmacy   HRI or Home Care Consult  Complete  Social Work Consult for Recovery Care Planning/Counseling  Complete  Palliative Care Screening  Not Applicable  Medication Review [facility])  Referral to Pharmacy

## 2023-11-18 NOTE — Anesthesia Postprocedure Evaluation (Signed)
 Anesthesia Post Note  Patient: Rojelio DELENA Moats  Procedure(s) Performed: CYSTOSCOPY, WITH RETROGRADE PYELOGRAM AND URETERAL STENT INSERTION (Right)     Patient location during evaluation: PACU Anesthesia Type: General Level of consciousness: awake and alert Pain management: pain level controlled Vital Signs Assessment: post-procedure vital signs reviewed and stable Respiratory status: spontaneous breathing, nonlabored ventilation, respiratory function stable and patient connected to nasal cannula oxygen Cardiovascular status: blood pressure returned to baseline and stable Postop Assessment: no apparent nausea or vomiting Anesthetic complications: no   No notable events documented.  Last Vitals:  Vitals:   11/18/23 0431 11/18/23 0850  BP: 136/89 135/70  Pulse: 91 (!) 109  Resp: 18   Temp: 36.7 C 36.8 C  SpO2: 94% 97%    Last Pain:  Vitals:   11/18/23 1145  TempSrc:   PainSc: 7                  Isabel Ardila S

## 2023-11-18 NOTE — Plan of Care (Signed)
 ?  Problem: Clinical Measurements: ?Goal: Will remain free from infection ?Outcome: Progressing ?  ?

## 2023-11-18 NOTE — Progress Notes (Signed)
 [facility]  Date of Admission:  11/15/2023      Total days of antibiotics 3           ASSESSMENT: [name] is a 63 y.o. female admitted with   ESBL E coli bacteremia - 2/2 to likely GU source/pyelonephritis. Has grown out both ESBL producing e coli and kleb pneumoniae in the past. The blood culture is still pending growth of another GNR organism likely E coli to match urine isolate. She has improved and WBC down to 11.7 today. She has remained afebrile and can hold food / fluids down. She will need IV antibiotics to cover the bacteremia given ESBL producing and no good oral options.  PICC was ordered - change to ertapenem today to complete today's treatment then will continue for 2 weeks total. EOT 10/21 then will need to try suppressive bactrim vs FQ until stent out.  Will have her come see me outpatient to make that switch and ensure clinical improvement  -ertapenem 1 gm now then to continue outpatient as outlined below   Hydronephrosis with stent placement - Will need to have her back to see what plans are for stent if we can try to suppress until removal. Likely transition to oral cipro    Vascular Access -  -PICC ordered -Home health orders to maintain PICC line care and education for patient described below   Discharge Planning / Coordination of Care -  -Outpatient antibiotics set -Discussed with Holley Herring, ID pharmacy, TOC and primary team  - she will be D/Cd home tomorrow so she can have inpatient teaching opportunity   Medication Monitoring -  -Safety labs ordered and detailed below to be followed in OPAT clinic  Contact Precautions -  Contact isolation recommended for ESBL   ID will sign off - please call back with any questions/concerns or if we can be of further assistance.     PLAN: OPAT as outlined below  Transition to oral cipro  for suppression until stone/stent are dealt with   OPAT ORDERS:  Diagnosis:  Bacteremia 2/2 Pyelonephritis UTI with stent placement   Culture Result: ESBL E coli / Kleb PNA  Allergies  Allergen Reactions   Morphine  Itching   Shrimp [Shellfish Allergy] Itching and Other (See Comments)    Tongue burns also   Tramadol  Other (See Comments)    Caused confusion   Covid-19 (Mrna) Vaccine Hives   Diltiazem  Hcl Itching    Pt with itching of the feet when bolus given   Latex Rash     Discharge antibiotics to be given via PICC line:  Ertapenem 1 gm IV daily    Duration: 2 weeks   End Date: 10/21  [facility] Care Per Protocol with [employer] Use: Home health RN for IV administration and teaching, line care and labs.    Labs weekly while on IV antibiotics: _x_ CBC with differential __ BMP **TWICE WEEKLY ON VANCOMYCIN   _x_ CMP _x_ CRP __ ESR __ Vancomycin  trough TWICE WEEKLY __ CK  _x_ Please pull PIC at completion of IV antibiotics __ Please leave PIC in place until doctor has seen patient or been notified  Fax weekly labs to [phone]  Clinic Follow Up Appt: 12/01/2023 @ 8:30 am with Corean Fireman, NP    Principal Problem:   Hydronephrosis of right kidney    atorvastatin   20 mg Oral Daily   metoprolol  succinate  25 mg Oral Daily   polyethylene  glycol  17 g Oral Daily   rivaroxaban   20 mg Oral Q supper   senna-docusate  1 tablet Oral BID    SUBJECTIVE: Ready to go home  Lots of very specific questions about home health, picc line etc.  Feeling better   Review of Systems: Review of Systems  Constitutional:  Negative for chills and fever.  Gastrointestinal:  Negative for abdominal pain, nausea and vomiting.  Genitourinary:  Positive for dysuria (due to stent string).    Allergies  Allergen Reactions   Morphine  Itching   Shrimp [Shellfish Allergy] Itching and Other (See Comments)    Tongue burns also   Tramadol  Other (See Comments)    Caused confusion   Covid-19 (Mrna) Vaccine Hives   Diltiazem  Hcl Itching    Pt with itching of  the feet when bolus given   Latex Rash    OBJECTIVE: Vitals:   11/17/23 1643 11/17/23 2002 11/18/23 0431 11/18/23 0850  BP: 135/70 138/73 136/89 135/70  Pulse: 96 (!) 108 91 (!) 109  Resp:  20 18   Temp: 98.6 F (37 C) 98.4 F (36.9 C) 98 F (36.7 C) 98.3 F (36.8 C)  TempSrc: Oral Oral Oral Oral  SpO2: 99% 96% 94% 97%  Weight:      Height:       Body mass index is 52.06 kg/m.  Physical Exam Vitals reviewed.  Constitutional:      Appearance: Normal appearance. She is well-developed. She is not ill-appearing.  HENT:     Mouth/Throat:     Mouth: Mucous membranes are moist.     Pharynx: Oropharynx is clear.  Eyes:     General: No scleral icterus. Cardiovascular:     Rate and Rhythm: Normal rate.  Pulmonary:     Effort: Pulmonary effort is normal.  Neurological:     Mental Status: She is alert and oriented to person, place, and time.  Psychiatric:        Mood and Affect: Mood normal.        Thought Content: Thought content normal.     Lab Results Lab Results  Component Value Date   WBC 11.7 (H) 11/18/2023   HGB 11.6 (L) 11/18/2023   HCT 37.1 11/18/2023   MCV 96.9 11/18/2023   PLT 195 11/18/2023    Lab Results  Component Value Date   CREATININE 1.26 (H) 11/18/2023   BUN 29 (H) 11/18/2023   NA 138 11/18/2023   K 4.2 11/18/2023   CL 102 11/18/2023   CO2 26 11/18/2023    Lab Results  Component Value Date   ALT 33 11/15/2023   AST 41 11/15/2023   ALKPHOS 113 11/15/2023   BILITOT 0.8 11/15/2023     Microbiology: Recent Results (from the past 240 hours)  Urine Culture     Status: Abnormal   Collection Time: 11/15/23  7:31 AM   Specimen: Urine, Clean Catch  Result Value Ref Range Status   Specimen Description URINE, CLEAN CATCH  Final   Special Requests   Final    NONE Performed at [facility], [address], [place], [place] [address]    Culture (A)  Final    >=100,000 COLONIES/mL ESCHERICHIA COLI 70,000 COLONIES/mL KLEBSIELLA  PNEUMONIAE Confirmed Extended Spectrum Beta-Lactamase Producer (ESBL).  In bloodstream infections from ESBL organisms, carbapenems are preferred over piperacillin /tazobactam. They are shown to have a lower risk of mortality. KLEBSIELLA PNEUMONIAE ESCHERICHIA COLI    Report Status 11/18/2023 FINAL  Final   Organism  ID, Bacteria ESCHERICHIA COLI (A)  Final   Organism ID, Bacteria KLEBSIELLA PNEUMONIAE (A)  Final      Susceptibility   Escherichia coli - MIC*    AMPICILLIN >=32 RESISTANT Resistant     CEFAZOLIN  (URINE) Value in next row Resistant      >=32 RESISTANTThis is a modified FDA-approved test that has been validated and its performance characteristics determined by the reporting laboratory.  This laboratory is certified under the Clinical Laboratory Improvement Amendments CLIA as qualified to perform high complexity clinical laboratory testing.    CEFEPIME  Value in next row Resistant      >=32 RESISTANTThis is a modified FDA-approved test that has been validated and its performance characteristics determined by the reporting laboratory.  This laboratory is certified under the Clinical Laboratory Improvement Amendments CLIA as qualified to perform high complexity clinical laboratory testing.    ERTAPENEM Value in next row Sensitive      >=32 RESISTANTThis is a modified FDA-approved test that has been validated and its performance characteristics determined by the reporting laboratory.  This laboratory is certified under the Clinical Laboratory Improvement Amendments CLIA as qualified to perform high complexity clinical laboratory testing.    CEFTRIAXONE  Value in next row Resistant      >=32 RESISTANTThis is a modified FDA-approved test that has been validated and its performance characteristics determined by the reporting laboratory.  This laboratory is certified under the Clinical Laboratory Improvement Amendments CLIA as qualified to perform high complexity clinical laboratory testing.     CIPROFLOXACIN  Value in next row Sensitive      >=32 RESISTANTThis is a modified FDA-approved test that has been validated and its performance characteristics determined by the reporting laboratory.  This laboratory is certified under the Clinical Laboratory Improvement Amendments CLIA as qualified to perform high complexity clinical laboratory testing.    GENTAMICIN Value in next row Intermediate      >=32 RESISTANTThis is a modified FDA-approved test that has been validated and its performance characteristics determined by the reporting laboratory.  This laboratory is certified under the Clinical Laboratory Improvement Amendments CLIA as qualified to perform high complexity clinical laboratory testing.    NITROFURANTOIN Value in next row Sensitive      >=32 RESISTANTThis is a modified FDA-approved test that has been validated and its performance characteristics determined by the reporting laboratory.  This laboratory is certified under the Clinical Laboratory Improvement Amendments CLIA as qualified to perform high complexity clinical laboratory testing.    TRIMETH/SULFA Value in next row Sensitive      >=32 RESISTANTThis is a modified FDA-approved test that has been validated and its performance characteristics determined by the reporting laboratory.  This laboratory is certified under the Clinical Laboratory Improvement Amendments CLIA as qualified to perform high complexity clinical laboratory testing.    AMPICILLIN/SULBACTAM Value in next row Intermediate      >=32 RESISTANTThis is a modified FDA-approved test that has been validated and its performance characteristics determined by the reporting laboratory.  This laboratory is certified under the Clinical Laboratory Improvement Amendments CLIA as qualified to perform high complexity clinical laboratory testing.    PIP/TAZO Value in next row Sensitive      <=4 SENSITIVEThis is a modified FDA-approved test that has been validated and its performance  characteristics determined by the reporting laboratory.  This laboratory is certified under the Clinical Laboratory Improvement Amendments CLIA as qualified to perform high complexity clinical laboratory testing.    MEROPENEM Value in next row Sensitive      <=  4 SENSITIVEThis is a modified FDA-approved test that has been validated and its performance characteristics determined by the reporting laboratory.  This laboratory is certified under the Clinical Laboratory Improvement Amendments CLIA as qualified to perform high complexity clinical laboratory testing.    * >=100,000 COLONIES/mL ESCHERICHIA COLI   Klebsiella pneumoniae - MIC*    AMPICILLIN Value in next row Resistant      <=4 SENSITIVEThis is a modified FDA-approved test that has been validated and its performance characteristics determined by the reporting laboratory.  This laboratory is certified under the Clinical Laboratory Improvement Amendments CLIA as qualified to perform high complexity clinical laboratory testing.    CEFAZOLIN  (URINE) Value in next row Resistant      >=32 RESISTANTThis is a modified FDA-approved test that has been validated and its performance characteristics determined by the reporting laboratory.  This laboratory is certified under the Clinical Laboratory Improvement Amendments CLIA as qualified to perform high complexity clinical laboratory testing.    CEFEPIME  Value in next row Resistant      >=32 RESISTANTThis is a modified FDA-approved test that has been validated and its performance characteristics determined by the reporting laboratory.  This laboratory is certified under the Clinical Laboratory Improvement Amendments CLIA as qualified to perform high complexity clinical laboratory testing.    ERTAPENEM Value in next row Sensitive      >=32 RESISTANTThis is a modified FDA-approved test that has been validated and its performance characteristics determined by the reporting laboratory.  This laboratory is certified  under the Clinical Laboratory Improvement Amendments CLIA as qualified to perform high complexity clinical laboratory testing.    CEFTRIAXONE  Value in next row Resistant      >=32 RESISTANTThis is a modified FDA-approved test that has been validated and its performance characteristics determined by the reporting laboratory.  This laboratory is certified under the Clinical Laboratory Improvement Amendments CLIA as qualified to perform high complexity clinical laboratory testing.    CIPROFLOXACIN  Value in next row Sensitive      >=32 RESISTANTThis is a modified FDA-approved test that has been validated and its performance characteristics determined by the reporting laboratory.  This laboratory is certified under the Clinical Laboratory Improvement Amendments CLIA as qualified to perform high complexity clinical laboratory testing.    GENTAMICIN Value in next row Sensitive      >=32 RESISTANTThis is a modified FDA-approved test that has been validated and its performance characteristics determined by the reporting laboratory.  This laboratory is certified under the Clinical Laboratory Improvement Amendments CLIA as qualified to perform high complexity clinical laboratory testing.    NITROFURANTOIN Value in next row Intermediate      >=32 RESISTANTThis is a modified FDA-approved test that has been validated and its performance characteristics determined by the reporting laboratory.  This laboratory is certified under the Clinical Laboratory Improvement Amendments CLIA as qualified to perform high complexity clinical laboratory testing.    TRIMETH/SULFA Value in next row Sensitive      >=32 RESISTANTThis is a modified FDA-approved test that has been validated and its performance characteristics determined by the reporting laboratory.  This laboratory is certified under the Clinical Laboratory Improvement Amendments CLIA as qualified to perform high complexity clinical laboratory testing.     AMPICILLIN/SULBACTAM Value in next row Intermediate      >=32 RESISTANTThis is a modified FDA-approved test that has been validated and its performance characteristics determined by the reporting laboratory.  This laboratory is certified under the Clinical Laboratory Improvement Amendments CLIA as  qualified to perform high complexity clinical laboratory testing.    PIP/TAZO Value in next row Sensitive      <=4 SENSITIVEThis is a modified FDA-approved test that has been validated and its performance characteristics determined by the reporting laboratory.  This laboratory is certified under the Clinical Laboratory Improvement Amendments CLIA as qualified to perform high complexity clinical laboratory testing.    MEROPENEM Value in next row Sensitive      <=4 SENSITIVEThis is a modified FDA-approved test that has been validated and its performance characteristics determined by the reporting laboratory.  This laboratory is certified under the Clinical Laboratory Improvement Amendments CLIA as qualified to perform high complexity clinical laboratory testing.    * 70,000 COLONIES/mL KLEBSIELLA PNEUMONIAE  Aerobic/Anaerobic Culture w Gram Stain (surgical/deep wound)     Status: None   Collection Time: 11/15/23 12:54 PM   Specimen: Path fluid; Body Fluid  Result Value Ref Range Status   Specimen Description FLUID  Final   Special Requests RIGHT RENAL PELVIS CULTURE  Final   Gram Stain   Final    FEW WBC PRESENT, PREDOMINANTLY PMN MODERATE GRAM NEGATIVE RODS    Culture   Final    ABUNDANT ESCHERICHIA COLI Confirmed Extended Spectrum Beta-Lactamase Producer (ESBL).  In bloodstream infections from ESBL organisms, carbapenems are preferred over piperacillin /tazobactam. They are shown to have a lower risk of mortality. NO ANAEROBES ISOLATED Performed at [facility], [address] [place], [place] [address]    Report Status 11/18/2023 FINAL  Final   Organism ID, Bacteria ESCHERICHIA COLI  Final       Susceptibility   Escherichia coli - MIC*    AMPICILLIN >=32 RESISTANT Resistant     CEFAZOLIN  (NON-URINE) >=32 RESISTANT Resistant     CEFEPIME  16 RESISTANT Resistant     ERTAPENEM <=0.12 SENSITIVE Sensitive     CEFTRIAXONE  >=64 RESISTANT Resistant     CIPROFLOXACIN  <=0.06 SENSITIVE Sensitive     GENTAMICIN 8 INTERMEDIATE Intermediate     MEROPENEM <=0.25 SENSITIVE Sensitive     TRIMETH/SULFA <=20 SENSITIVE Sensitive     AMPICILLIN/SULBACTAM 16 INTERMEDIATE Intermediate     PIP/TAZO Value in next row Sensitive      <=4 SENSITIVEThis is a modified FDA-approved test that has been validated and its performance characteristics determined by the reporting laboratory.  This laboratory is certified under the Clinical Laboratory Improvement Amendments CLIA as qualified to perform high complexity clinical laboratory testing.    * ABUNDANT ESCHERICHIA COLI  Blood culture (routine x 2)     Status: Abnormal   Collection Time: 11/15/23  4:46 PM   Specimen: BLOOD LEFT ARM  Result Value Ref Range Status   Specimen Description BLOOD LEFT ARM  Final   Special Requests   Final    BOTTLES DRAWN AEROBIC AND ANAEROBIC Blood Culture results may not be optimal due to an inadequate volume of blood received in culture bottles   Culture  Setup Time   Final    GRAM NEGATIVE RODS AEROBIC BOTTLE ONLY CRITICAL VALUE NOTED.  VALUE IS CONSISTENT WITH PREVIOUSLY REPORTED AND CALLED VALUE.    Culture (A)  Final    KLEBSIELLA PNEUMONIAE SUSCEPTIBILITIES PERFORMED ON PREVIOUS CULTURE WITHIN THE LAST 5 DAYS. Performed at [facility], [address] [place], [place] [address]    Report Status 11/18/2023 FINAL  Final  Blood culture (routine x 2)     Status: Abnormal (Preliminary result)   Collection Time: 11/15/23  4:55 PM  Specimen: BLOOD LEFT HAND  Result Value Ref Range Status   Specimen Description BLOOD LEFT HAND  Final   Special Requests   Final    BOTTLES DRAWN AEROBIC AND ANAEROBIC Blood Culture  results may not be optimal due to an inadequate volume of blood received in culture bottles   Culture  Setup Time   Final    GRAM NEGATIVE RODS IN BOTH AEROBIC AND ANAEROBIC BOTTLES CRITICAL RESULT CALLED TO, READ BACK BY AND VERIFIED WITH: PHARMD B WANNARAT [id number] AT 1037 BY CM    Culture (A)  Final    KLEBSIELLA PNEUMONIAE Confirmed Extended Spectrum Beta-Lactamase Producer (ESBL).  In bloodstream infections from ESBL organisms, carbapenems are preferred over piperacillin /tazobactam. They are shown to have a lower risk of mortality. CULTURE REINCUBATED FOR BETTER GROWTH Performed at [facility], [address] [place], [place] [address]    Report Status PENDING  Incomplete   Organism ID, Bacteria KLEBSIELLA PNEUMONIAE  Final      Susceptibility   Klebsiella pneumoniae - MIC*    AMPICILLIN >=32 RESISTANT Resistant     CEFAZOLIN  (NON-URINE) >=32 RESISTANT Resistant     CEFEPIME  >=32 RESISTANT Resistant     ERTAPENEM <=0.12 SENSITIVE Sensitive     CEFTRIAXONE  >=64 RESISTANT Resistant     CIPROFLOXACIN  <=0.06 SENSITIVE Sensitive     GENTAMICIN 8 INTERMEDIATE Intermediate     MEROPENEM <=0.25 SENSITIVE Sensitive     TRIMETH/SULFA <=20 SENSITIVE Sensitive     AMPICILLIN/SULBACTAM 4 SENSITIVE Sensitive     PIP/TAZO Value in next row Sensitive      <=4 SENSITIVEThis is a modified FDA-approved test that has been validated and its performance characteristics determined by the reporting laboratory.  This laboratory is certified under the Clinical Laboratory Improvement Amendments CLIA as qualified to perform high complexity clinical laboratory testing.    * KLEBSIELLA PNEUMONIAE  Blood Culture ID Panel (Reflexed)     Status: Abnormal   Collection Time: 11/15/23  4:55 PM  Result Value Ref Range Status   Enterococcus faecalis NOT DETECTED NOT DETECTED Final   Enterococcus Faecium NOT DETECTED NOT DETECTED Final   Listeria monocytogenes NOT DETECTED NOT DETECTED Final   Staphylococcus  species NOT DETECTED NOT DETECTED Final   Staphylococcus aureus (BCID) NOT DETECTED NOT DETECTED Final   Staphylococcus epidermidis NOT DETECTED NOT DETECTED Final   Staphylococcus lugdunensis NOT DETECTED NOT DETECTED Final   Streptococcus species NOT DETECTED NOT DETECTED Final   Streptococcus agalactiae NOT DETECTED NOT DETECTED Final   Streptococcus pneumoniae NOT DETECTED NOT DETECTED Final   Streptococcus pyogenes NOT DETECTED NOT DETECTED Final   A.calcoaceticus-baumannii NOT DETECTED NOT DETECTED Final   Bacteroides fragilis NOT DETECTED NOT DETECTED Final   Enterobacterales DETECTED (A) NOT DETECTED Final    Comment: CRITICAL RESULT CALLED TO, READ BACK BY AND VERIFIED WITH: PHARMD B WANNARAT [id number] AT 1037 BY CM    Enterobacter cloacae complex NOT DETECTED NOT DETECTED Final   Escherichia coli NOT DETECTED NOT DETECTED Final   Klebsiella aerogenes NOT DETECTED NOT DETECTED Final   Klebsiella oxytoca NOT DETECTED NOT DETECTED Final   Klebsiella pneumoniae DETECTED (A) NOT DETECTED Final    Comment: CRITICAL RESULT CALLED TO, READ BACK BY AND VERIFIED WITH: PHARMD B WANNARAT [id number] AT 1037 BY CM    Proteus species NOT DETECTED NOT DETECTED Final   Salmonella species NOT DETECTED NOT DETECTED Final   Serratia marcescens NOT DETECTED NOT DETECTED Final   Haemophilus influenzae NOT DETECTED NOT DETECTED  Final   Neisseria meningitidis NOT DETECTED NOT DETECTED Final   Pseudomonas aeruginosa NOT DETECTED NOT DETECTED Final   Stenotrophomonas maltophilia NOT DETECTED NOT DETECTED Final   Candida albicans NOT DETECTED NOT DETECTED Final   Candida auris NOT DETECTED NOT DETECTED Final   Candida glabrata NOT DETECTED NOT DETECTED Final   Candida krusei NOT DETECTED NOT DETECTED Final   Candida parapsilosis NOT DETECTED NOT DETECTED Final   Candida tropicalis NOT DETECTED NOT DETECTED Final   Cryptococcus neoformans/gattii NOT DETECTED NOT DETECTED Final   CTX-M ESBL DETECTED (A)  NOT DETECTED Final    Comment: CRITICAL RESULT CALLED TO, READ BACK BY AND VERIFIED WITH: PHARMD B WANNARAT [id number] AT 1037 BY CM (NOTE) Extended spectrum beta-lactamase detected. Recommend a carbapenem as initial therapy.      Carbapenem resistance IMP NOT DETECTED NOT DETECTED Final   Carbapenem resistance KPC NOT DETECTED NOT DETECTED Final   Carbapenem resistance NDM NOT DETECTED NOT DETECTED Final   Carbapenem resist OXA 48 LIKE NOT DETECTED NOT DETECTED Final   Carbapenem resistance VIM NOT DETECTED NOT DETECTED Final    Comment: Performed at [facility], [address] [place], [place] [address]     Corean Fireman, MSN, NP-C [facility]  [email or web address] Pager: [phone] Office: [phone] [facility] Main Line: [phone] *Secure Chat Communication Welcome  Total Encounter Time: 58 m

## 2023-11-18 NOTE — Progress Notes (Signed)
 PROGRESS NOTE  [name]  FMW:[id number] DOB: Mar 23, 1960 DOA: 11/15/2023 PCP: Norleen Lynwood ORN, MD   Brief Narrative: Patient is a 63 year old female with history of paroxysmal A-fib, asthma, diverticulosis, GERD, hyperlipidemia, hypertension, recent admission on 8/25 for right hydronephrosis due to nephroureterolithiasis requiring ureteral stent and complication with E. coli bacteremia presented with abdominal pain, bloating, nausea, vomiting.  On presentation she was tachycardic, tachypneic.  Lab work showed creatinine of 2(baseline creatinine of 1.1), leukocytosis.  CT abdomen is/pelvis showed right-sided hydronephrosis with obstructive ureteral stone at right UPJ.  Urology consulted.  Status post cystoscopy, bilateral retrograde pyelogram, bilateral ureteral stent placement on 10/5.  Blood cultures showing ESBL Klebsiella.  Urine culture showed ESBL E. coli and ESBL Klebsiella.  Antibiotic changed to ertapenem.  Plan for discharge tomorrow with PICC line.   Assessment & Plan:  Principal Problem:   Hydronephrosis of right kidney  ESBL Klebsiella bacteremia/ESBL E. coli: Blood Culture/urine culture showing ESBL Klebsiella/E. Coli Sepsis  physiology has improved  .Leukocytosis improved.  No fever.  Consulted ID.  Antibiotic changed to ertapenem.  Plan for 2 weeks course.  Plan for PICC line placement.  As per TOC, she will be taught about home health infusion only tomorrow  AKI: Baseline creatinine 1.1.  Presented with creatinine range of 2.  Likely from obstructing renal stone. Improved with iv fluid  Right hydronephrosis/obstructive ureteral stone at right UPJ:  Urology consulted.  Status post cystoscopy, bilateral retrograde pyelogram, bilateral ureteral stent placement on 10/5.   Continue current antibiotics.  Needs to follow-up with urology as an outpatient  Paroxysmal A-fib: Currently on normal sinus rhythm.  On Toprol , Xarelto   Hypertension: Takes Hyzaar  Morbid obesity: BMI of  52        DVT prophylaxis: rivaroxaban  (XARELTO ) tablet 20 mg     Code Status: Full Code  Family Communication: None at bedside  Patient status:Inpatient  Patient is from :Home  Anticipated discharge un:Ynfz  Estimated DC date: Tomorrow   Consultants: Urology,ID  Procedures: Bilateral ureteral stent placement.  Antimicrobials:  Anti-infectives (From admission, onward)    Start     Dose/Rate Route Frequency Ordered Stop   11/18/23 1045  ertapenem (INVANZ) 1 g in sodium chloride  0.9 % 100 mL IVPB        1 g 200 mL/hr over 30 Minutes Intravenous Every 24 hours 11/18/23 0953     11/18/23 0904  meropenem (MERREM) 1 g in sodium chloride  0.9 % 100 mL IVPB  Status:  Discontinued        1 g 200 mL/hr over 30 Minutes Intravenous Every 8 hours 11/18/23 0904 11/18/23 0952   11/16/23 1145  meropenem (MERREM) 1 g in sodium chloride  0.9 % 100 mL IVPB  Status:  Discontinued        1 g 200 mL/hr over 30 Minutes Intravenous Every 12 hours 11/16/23 1045 11/18/23 0904   11/16/23 1000  cefTRIAXone  (ROCEPHIN ) 1 g in sodium chloride  0.9 % 100 mL IVPB  Status:  Discontinued        1 g 200 mL/hr over 30 Minutes Intravenous Every 24 hours 11/15/23 0927 11/16/23 1045   11/15/23 0745  cefTRIAXone  (ROCEPHIN ) 2 g in sodium chloride  0.9 % 100 mL IVPB        2 g 200 mL/hr over 30 Minutes Intravenous  Once 11/15/23 0730 11/15/23 0834       Subjective: Patient seen and examined at bedside today.  Very comfortable.  Afebrile.  No new complaints.  Objective:  Vitals:   11/17/23 1643 11/17/23 2002 11/18/23 0431 11/18/23 0850  BP: 135/70 138/73 136/89 135/70  Pulse: 96 (!) 108 91 (!) 109  Resp:  20 18   Temp: 98.6 F (37 C) 98.4 F (36.9 C) 98 F (36.7 C) 98.3 F (36.8 C)  TempSrc: Oral Oral Oral Oral  SpO2: 99% 96% 94% 97%  Weight:      Height:        Intake/Output Summary (Last 24 hours) at 11/18/2023 1158 Last data filed at 11/18/2023 1000 Gross per 24 hour  Intake 820 ml  Output 0  ml  Net 820 ml   Filed Weights   11/15/23 0401 11/15/23 1156  Weight: 127 kg 127 kg    Examination:  General exam: Overall comfortable, not in distress, morbidly obese HEENT: PERRL Respiratory system:  no wheezes or crackles  Cardiovascular system: S1 & S2 heard, RRR.  Gastrointestinal system: Abdomen is nondistended, soft and nontender. Central nervous system: Alert and oriented Extremities: No edema, no clubbing ,no cyanosis Skin: No rashes, no ulcers,no icterus      Data Reviewed: I have personally reviewed following labs and imaging studies  CBC: Recent Labs  Lab 11/15/23 0405 11/16/23 0945 11/17/23 0238 11/18/23 0211  WBC 25.5* 20.0* 13.8* 11.7*  HGB 13.3 12.5 12.3 11.6*  HCT 41.1 39.7 39.1 37.1  MCV 93.6 95.9 96.5 96.9  PLT 255 215 201 195   Basic Metabolic Panel: Recent Labs  Lab 11/15/23 0405 11/16/23 0945 11/17/23 0238 11/18/23 0211  NA 138 137 138 138  K 3.9 4.4 4.2 4.2  CL 102 102 104 102  CO2 20* 23 26 26   GLUCOSE 122* 192* 120* 110*  BUN 24* 36* 37* 29*  CREATININE 2.04* 2.14* 1.67* 1.26*  CALCIUM  8.8* 8.0* 8.3* 8.4*     Recent Results (from the past 240 hours)  Urine Culture     Status: Abnormal   Collection Time: 11/15/23  7:31 AM   Specimen: Urine, Clean Catch  Result Value Ref Range Status   Specimen Description URINE, CLEAN CATCH  Final   Special Requests   Final    NONE Performed at [facility], [address] [place], [place] [address]    Culture (A)  Final    >=100,000 COLONIES/mL ESCHERICHIA COLI 70,000 COLONIES/mL KLEBSIELLA PNEUMONIAE Confirmed Extended Spectrum Beta-Lactamase Producer (ESBL).  In bloodstream infections from ESBL organisms, carbapenems are preferred over piperacillin /tazobactam. They are shown to have a lower risk of mortality. KLEBSIELLA PNEUMONIAE ESCHERICHIA COLI    Report Status 11/18/2023 FINAL  Final   Organism ID, Bacteria ESCHERICHIA COLI (A)  Final   Organism ID, Bacteria KLEBSIELLA  PNEUMONIAE (A)  Final      Susceptibility   Escherichia coli - MIC*    AMPICILLIN >=32 RESISTANT Resistant     CEFAZOLIN  (URINE) Value in next row Resistant      >=32 RESISTANTThis is a modified FDA-approved test that has been validated and its performance characteristics determined by the reporting laboratory.  This laboratory is certified under the Clinical Laboratory Improvement Amendments CLIA as qualified to perform high complexity clinical laboratory testing.    CEFEPIME  Value in next row Resistant      >=32 RESISTANTThis is a modified FDA-approved test that has been validated and its performance characteristics determined by the reporting laboratory.  This laboratory is certified under the Clinical Laboratory Improvement Amendments CLIA as qualified to perform high complexity clinical laboratory testing.    ERTAPENEM Value in next row Sensitive      >=  32 RESISTANTThis is a modified FDA-approved test that has been validated and its performance characteristics determined by the reporting laboratory.  This laboratory is certified under the Clinical Laboratory Improvement Amendments CLIA as qualified to perform high complexity clinical laboratory testing.    CEFTRIAXONE  Value in next row Resistant      >=32 RESISTANTThis is a modified FDA-approved test that has been validated and its performance characteristics determined by the reporting laboratory.  This laboratory is certified under the Clinical Laboratory Improvement Amendments CLIA as qualified to perform high complexity clinical laboratory testing.    CIPROFLOXACIN  Value in next row Sensitive      >=32 RESISTANTThis is a modified FDA-approved test that has been validated and its performance characteristics determined by the reporting laboratory.  This laboratory is certified under the Clinical Laboratory Improvement Amendments CLIA as qualified to perform high complexity clinical laboratory testing.    GENTAMICIN Value in next row Intermediate       >=32 RESISTANTThis is a modified FDA-approved test that has been validated and its performance characteristics determined by the reporting laboratory.  This laboratory is certified under the Clinical Laboratory Improvement Amendments CLIA as qualified to perform high complexity clinical laboratory testing.    NITROFURANTOIN Value in next row Sensitive      >=32 RESISTANTThis is a modified FDA-approved test that has been validated and its performance characteristics determined by the reporting laboratory.  This laboratory is certified under the Clinical Laboratory Improvement Amendments CLIA as qualified to perform high complexity clinical laboratory testing.    TRIMETH/SULFA Value in next row Sensitive      >=32 RESISTANTThis is a modified FDA-approved test that has been validated and its performance characteristics determined by the reporting laboratory.  This laboratory is certified under the Clinical Laboratory Improvement Amendments CLIA as qualified to perform high complexity clinical laboratory testing.    AMPICILLIN/SULBACTAM Value in next row Intermediate      >=32 RESISTANTThis is a modified FDA-approved test that has been validated and its performance characteristics determined by the reporting laboratory.  This laboratory is certified under the Clinical Laboratory Improvement Amendments CLIA as qualified to perform high complexity clinical laboratory testing.    PIP/TAZO Value in next row Sensitive      <=4 SENSITIVEThis is a modified FDA-approved test that has been validated and its performance characteristics determined by the reporting laboratory.  This laboratory is certified under the Clinical Laboratory Improvement Amendments CLIA as qualified to perform high complexity clinical laboratory testing.    MEROPENEM Value in next row Sensitive      <=4 SENSITIVEThis is a modified FDA-approved test that has been validated and its performance characteristics determined by the reporting  laboratory.  This laboratory is certified under the Clinical Laboratory Improvement Amendments CLIA as qualified to perform high complexity clinical laboratory testing.    * >=100,000 COLONIES/mL ESCHERICHIA COLI   Klebsiella pneumoniae - MIC*    AMPICILLIN Value in next row Resistant      <=4 SENSITIVEThis is a modified FDA-approved test that has been validated and its performance characteristics determined by the reporting laboratory.  This laboratory is certified under the Clinical Laboratory Improvement Amendments CLIA as qualified to perform high complexity clinical laboratory testing.    CEFAZOLIN  (URINE) Value in next row Resistant      >=32 RESISTANTThis is a modified FDA-approved test that has been validated and its performance characteristics determined by the reporting laboratory.  This laboratory is certified under the Clinical Laboratory Improvement Amendments CLIA as  qualified to perform high complexity clinical laboratory testing.    CEFEPIME  Value in next row Resistant      >=32 RESISTANTThis is a modified FDA-approved test that has been validated and its performance characteristics determined by the reporting laboratory.  This laboratory is certified under the Clinical Laboratory Improvement Amendments CLIA as qualified to perform high complexity clinical laboratory testing.    ERTAPENEM Value in next row Sensitive      >=32 RESISTANTThis is a modified FDA-approved test that has been validated and its performance characteristics determined by the reporting laboratory.  This laboratory is certified under the Clinical Laboratory Improvement Amendments CLIA as qualified to perform high complexity clinical laboratory testing.    CEFTRIAXONE  Value in next row Resistant      >=32 RESISTANTThis is a modified FDA-approved test that has been validated and its performance characteristics determined by the reporting laboratory.  This laboratory is certified under the Clinical Laboratory Improvement  Amendments CLIA as qualified to perform high complexity clinical laboratory testing.    CIPROFLOXACIN  Value in next row Sensitive      >=32 RESISTANTThis is a modified FDA-approved test that has been validated and its performance characteristics determined by the reporting laboratory.  This laboratory is certified under the Clinical Laboratory Improvement Amendments CLIA as qualified to perform high complexity clinical laboratory testing.    GENTAMICIN Value in next row Sensitive      >=32 RESISTANTThis is a modified FDA-approved test that has been validated and its performance characteristics determined by the reporting laboratory.  This laboratory is certified under the Clinical Laboratory Improvement Amendments CLIA as qualified to perform high complexity clinical laboratory testing.    NITROFURANTOIN Value in next row Intermediate      >=32 RESISTANTThis is a modified FDA-approved test that has been validated and its performance characteristics determined by the reporting laboratory.  This laboratory is certified under the Clinical Laboratory Improvement Amendments CLIA as qualified to perform high complexity clinical laboratory testing.    TRIMETH/SULFA Value in next row Sensitive      >=32 RESISTANTThis is a modified FDA-approved test that has been validated and its performance characteristics determined by the reporting laboratory.  This laboratory is certified under the Clinical Laboratory Improvement Amendments CLIA as qualified to perform high complexity clinical laboratory testing.    AMPICILLIN/SULBACTAM Value in next row Intermediate      >=32 RESISTANTThis is a modified FDA-approved test that has been validated and its performance characteristics determined by the reporting laboratory.  This laboratory is certified under the Clinical Laboratory Improvement Amendments CLIA as qualified to perform high complexity clinical laboratory testing.    PIP/TAZO Value in next row Sensitive      <=4  SENSITIVEThis is a modified FDA-approved test that has been validated and its performance characteristics determined by the reporting laboratory.  This laboratory is certified under the Clinical Laboratory Improvement Amendments CLIA as qualified to perform high complexity clinical laboratory testing.    MEROPENEM Value in next row Sensitive      <=4 SENSITIVEThis is a modified FDA-approved test that has been validated and its performance characteristics determined by the reporting laboratory.  This laboratory is certified under the Clinical Laboratory Improvement Amendments CLIA as qualified to perform high complexity clinical laboratory testing.    * 70,000 COLONIES/mL KLEBSIELLA PNEUMONIAE  Aerobic/Anaerobic Culture w Gram Stain (surgical/deep wound)     Status: None   Collection Time: 11/15/23 12:54 PM   Specimen: Path fluid; Body Fluid  Result Value Ref  Range Status   Specimen Description FLUID  Final   Special Requests RIGHT RENAL PELVIS CULTURE  Final   Gram Stain   Final    FEW WBC PRESENT, PREDOMINANTLY PMN MODERATE GRAM NEGATIVE RODS    Culture   Final    ABUNDANT ESCHERICHIA COLI Confirmed Extended Spectrum Beta-Lactamase Producer (ESBL).  In bloodstream infections from ESBL organisms, carbapenems are preferred over piperacillin /tazobactam. They are shown to have a lower risk of mortality. NO ANAEROBES ISOLATED Performed at [facility], [address], [place], [place] [address]    Report Status 11/18/2023 FINAL  Final   Organism ID, Bacteria ESCHERICHIA COLI  Final      Susceptibility   Escherichia coli - MIC*    AMPICILLIN >=32 RESISTANT Resistant     CEFAZOLIN  (NON-URINE) >=32 RESISTANT Resistant     CEFEPIME  16 RESISTANT Resistant     ERTAPENEM <=0.12 SENSITIVE Sensitive     CEFTRIAXONE  >=64 RESISTANT Resistant     CIPROFLOXACIN  <=0.06 SENSITIVE Sensitive     GENTAMICIN 8 INTERMEDIATE Intermediate     MEROPENEM <=0.25 SENSITIVE Sensitive     TRIMETH/SULFA <=20  SENSITIVE Sensitive     AMPICILLIN/SULBACTAM 16 INTERMEDIATE Intermediate     PIP/TAZO Value in next row Sensitive      <=4 SENSITIVEThis is a modified FDA-approved test that has been validated and its performance characteristics determined by the reporting laboratory.  This laboratory is certified under the Clinical Laboratory Improvement Amendments CLIA as qualified to perform high complexity clinical laboratory testing.    * ABUNDANT ESCHERICHIA COLI  Blood culture (routine x 2)     Status: Abnormal   Collection Time: 11/15/23  4:46 PM   Specimen: BLOOD LEFT ARM  Result Value Ref Range Status   Specimen Description BLOOD LEFT ARM  Final   Special Requests   Final    BOTTLES DRAWN AEROBIC AND ANAEROBIC Blood Culture results may not be optimal due to an inadequate volume of blood received in culture bottles   Culture  Setup Time   Final    GRAM NEGATIVE RODS AEROBIC BOTTLE ONLY CRITICAL VALUE NOTED.  VALUE IS CONSISTENT WITH PREVIOUSLY REPORTED AND CALLED VALUE.    Culture (A)  Final    KLEBSIELLA PNEUMONIAE SUSCEPTIBILITIES PERFORMED ON PREVIOUS CULTURE WITHIN THE LAST 5 DAYS. Performed at [facility], [address] [place], [place] [address]    Report Status 11/18/2023 FINAL  Final  Blood culture (routine x 2)     Status: Abnormal (Preliminary result)   Collection Time: 11/15/23  4:55 PM   Specimen: BLOOD LEFT HAND  Result Value Ref Range Status   Specimen Description BLOOD LEFT HAND  Final   Special Requests   Final    BOTTLES DRAWN AEROBIC AND ANAEROBIC Blood Culture results may not be optimal due to an inadequate volume of blood received in culture bottles   Culture  Setup Time   Final    GRAM NEGATIVE RODS IN BOTH AEROBIC AND ANAEROBIC BOTTLES CRITICAL RESULT CALLED TO, READ BACK BY AND VERIFIED WITH: PHARMD B WANNARAT [id number] AT 1037 BY CM    Culture (A)  Final    KLEBSIELLA PNEUMONIAE Confirmed Extended Spectrum Beta-Lactamase Producer (ESBL).  In bloodstream  infections from ESBL organisms, carbapenems are preferred over piperacillin /tazobactam. They are shown to have a lower risk of mortality. CULTURE REINCUBATED FOR BETTER GROWTH Performed at [facility], [address], [place], [place] [address]    Report Status PENDING  Incomplete  Organism ID, Bacteria KLEBSIELLA PNEUMONIAE  Final      Susceptibility   Klebsiella pneumoniae - MIC*    AMPICILLIN >=32 RESISTANT Resistant     CEFAZOLIN  (NON-URINE) >=32 RESISTANT Resistant     CEFEPIME  >=32 RESISTANT Resistant     ERTAPENEM <=0.12 SENSITIVE Sensitive     CEFTRIAXONE  >=64 RESISTANT Resistant     CIPROFLOXACIN  <=0.06 SENSITIVE Sensitive     GENTAMICIN 8 INTERMEDIATE Intermediate     MEROPENEM <=0.25 SENSITIVE Sensitive     TRIMETH/SULFA <=20 SENSITIVE Sensitive     AMPICILLIN/SULBACTAM 4 SENSITIVE Sensitive     PIP/TAZO Value in next row Sensitive      <=4 SENSITIVEThis is a modified FDA-approved test that has been validated and its performance characteristics determined by the reporting laboratory.  This laboratory is certified under the Clinical Laboratory Improvement Amendments CLIA as qualified to perform high complexity clinical laboratory testing.    * KLEBSIELLA PNEUMONIAE  Blood Culture ID Panel (Reflexed)     Status: Abnormal   Collection Time: 11/15/23  4:55 PM  Result Value Ref Range Status   Enterococcus faecalis NOT DETECTED NOT DETECTED Final   Enterococcus Faecium NOT DETECTED NOT DETECTED Final   Listeria monocytogenes NOT DETECTED NOT DETECTED Final   Staphylococcus species NOT DETECTED NOT DETECTED Final   Staphylococcus aureus (BCID) NOT DETECTED NOT DETECTED Final   Staphylococcus epidermidis NOT DETECTED NOT DETECTED Final   Staphylococcus lugdunensis NOT DETECTED NOT DETECTED Final   Streptococcus species NOT DETECTED NOT DETECTED Final   Streptococcus agalactiae NOT DETECTED NOT DETECTED Final   Streptococcus pneumoniae NOT DETECTED NOT DETECTED Final    Streptococcus pyogenes NOT DETECTED NOT DETECTED Final   A.calcoaceticus-baumannii NOT DETECTED NOT DETECTED Final   Bacteroides fragilis NOT DETECTED NOT DETECTED Final   Enterobacterales DETECTED (A) NOT DETECTED Final    Comment: CRITICAL RESULT CALLED TO, READ BACK BY AND VERIFIED WITH: PHARMD B WANNARAT [id number] AT 1037 BY CM    Enterobacter cloacae complex NOT DETECTED NOT DETECTED Final   Escherichia coli NOT DETECTED NOT DETECTED Final   Klebsiella aerogenes NOT DETECTED NOT DETECTED Final   Klebsiella oxytoca NOT DETECTED NOT DETECTED Final   Klebsiella pneumoniae DETECTED (A) NOT DETECTED Final    Comment: CRITICAL RESULT CALLED TO, READ BACK BY AND VERIFIED WITH: PHARMD B WANNARAT [id number] AT 1037 BY CM    Proteus species NOT DETECTED NOT DETECTED Final   Salmonella species NOT DETECTED NOT DETECTED Final   Serratia marcescens NOT DETECTED NOT DETECTED Final   Haemophilus influenzae NOT DETECTED NOT DETECTED Final   Neisseria meningitidis NOT DETECTED NOT DETECTED Final   Pseudomonas aeruginosa NOT DETECTED NOT DETECTED Final   Stenotrophomonas maltophilia NOT DETECTED NOT DETECTED Final   Candida albicans NOT DETECTED NOT DETECTED Final   Candida auris NOT DETECTED NOT DETECTED Final   Candida glabrata NOT DETECTED NOT DETECTED Final   Candida krusei NOT DETECTED NOT DETECTED Final   Candida parapsilosis NOT DETECTED NOT DETECTED Final   Candida tropicalis NOT DETECTED NOT DETECTED Final   Cryptococcus neoformans/gattii NOT DETECTED NOT DETECTED Final   CTX-M ESBL DETECTED (A) NOT DETECTED Final    Comment: CRITICAL RESULT CALLED TO, READ BACK BY AND VERIFIED WITH: PHARMD B WANNARAT [id number] AT 1037 BY CM (NOTE) Extended spectrum beta-lactamase detected. Recommend a carbapenem as initial therapy.      Carbapenem resistance IMP NOT DETECTED NOT DETECTED Final   Carbapenem resistance KPC NOT DETECTED NOT DETECTED Final   Carbapenem resistance  NDM NOT DETECTED NOT DETECTED  Final   Carbapenem resist OXA 48 LIKE NOT DETECTED NOT DETECTED Final   Carbapenem resistance VIM NOT DETECTED NOT DETECTED Final    Comment: Performed at [facility], [address], [place], [place] [address]     Radiology Studies: US  EKG SITE RITE Result Date: 11/18/2023 If Site Rite image not attached, placement could not be confirmed due to current cardiac rhythm.   Scheduled Meds:  atorvastatin   20 mg Oral Daily   metoprolol  succinate  25 mg Oral Daily   polyethylene glycol  17 g Oral Daily   rivaroxaban   20 mg Oral Q supper   senna-docusate  1 tablet Oral BID   Continuous Infusions:  ertapenem       LOS: 3 days   Ivonne Mustache, MD [facility] P10/09/2023, 11:58 AM

## 2023-11-18 NOTE — Progress Notes (Signed)
 0730: This nurse assumes care for this patient. N-N report received from Clarissa, [place]. Oxybutynin  and Percocet given this morning. Patient states she understands her plan of care.   Morning Rounding:  Patient seen sitting in bed comfortably. States her night was okay. Concerned about her lab work and if it has resulted. Also concerned about her plan of care. Dr. Jillian has rounded on patient.   1019: Medications given to patient and questions answered concerning blood work. Patient asking about the switch to her antibiotics and education about her PICC line (She is concerned about placement and adverse side affects. This nurse will inquiry if ID can stop by to consult with patient and will have IV team RN educate on PICC line. Awaiting INVANZ from central pharmacy. Patient also requesting albuterol  inhaler instead of nebulizer.   Rounding 1300: Patient states ID updated her and stated she would be here for one more night. States she is scheduled to have her PICC line placed followed with [place] medication education.  Dr. Jillian bedside states that patient may be d/c today, but will confirm.   1400: Patient's IV dressing changed at this time  F/U: Per ID and Attending, patient to remain for the night. Patient updated.   1600 Rounding: Patient seen sitting in recliner at this time. Requesting inhaler. Dr. Jillian notified, ne orders placed.  1620: This nurse contacts CP as all albuterol  orders are reverting to nebulizer and patient is requesting inhaler. Patient offered and refuses inhaler as she states is increases her HR and makes her Jittery.   1720: This RN to patient's beside as she is calling out for inhaler and states she would like her O2 saturation measured. States she is feeling anxious. Oxygen recorded. Patient offered and denies nebulizer.   Shift Assessment:  Ms. Sadonna had an seemingly easy day. She took all prescribed medications and remained free of injury, signs of new  infection and her pain was stable for most of the shift. Per Vertell, RN (oncoming RN) IV team to bedside near shift change but patient refused line placement at that time, states she would prefer the morning. Report given to Vertell, Charity fundraiser (charge) and Vertell en route to bedside.   See flowsheet for physical assessment.   Caryle Helgeson, Charity fundraiser

## 2023-11-18 NOTE — Progress Notes (Signed)
 PHARMACY CONSULT NOTE FOR:  OUTPATIENT  PARENTERAL ANTIBIOTIC THERAPY (OPAT)  Indication: ESBL Klebsiella pneumoniae bacteremia Regimen: Ertapenem IV 1g daily End date: 12/01/2023  IV antibiotic discharge orders are pended. To discharging provider:  please sign these orders via discharge navigator,  Select New Orders & click on the button choice - Manage This Unsigned Work.     Thank you for allowing pharmacy to be a part of this patient's care.  Feliciano Close, PharmD PGY2 Infectious Diseases Pharmacy Resident  11/18/2023 10:18 AM

## 2023-11-18 NOTE — Telephone Encounter (Signed)
 Patient called with concerns that she has an appointment scheduled with our office and would like to know why. Reviewed that inpatient ID team started her on IV abx and this appointment is to follow up on that treatment. Patient verbalized understanding and has no further questions.   [name], BSN, RN

## 2023-11-18 NOTE — Progress Notes (Signed)
 PICC RN at bedside to discuss PICC placement. All questions answered at this time and patient gave consent for placement, but not tonight. Patient states she is agreeable tomorrow but it is too much for her tonight. Patient informed that a specific time could not be promised tomorrow for placement if not done tonight. Primary RN informed.

## 2023-11-19 ENCOUNTER — Other Ambulatory Visit: Payer: Self-pay | Admitting: Urology

## 2023-11-19 DIAGNOSIS — N1330 Unspecified hydronephrosis: Secondary | ICD-10-CM | POA: Diagnosis not present

## 2023-11-19 LAB — CULTURE, BLOOD (ROUTINE X 2)

## 2023-11-19 MED ORDER — ERTAPENEM IV (FOR PTA / DISCHARGE USE ONLY): 1.0000 g | INTRAVENOUS | 0 refills | Status: AC

## 2023-11-19 MED ORDER — ONDANSETRON HCL 4 MG/2ML IJ SOLN
4.0000 mg | Freq: Four times a day (QID) | INTRAMUSCULAR | Status: DC | PRN
  Administered 2023-11-19: 4 mg via INTRAVENOUS
  Filled 2023-11-19: qty 2

## 2023-11-19 NOTE — Plan of Care (Signed)
  Problem: Clinical Measurements: Goal: Ability to maintain clinical measurements within normal limits will improve Outcome: Progressing Goal: Will remain free from infection Outcome: Progressing   Problem: Coping: Goal: Level of anxiety will decrease Outcome: Progressing   Problem: Elimination: Goal: Will not experience complications related to bowel motility Outcome: Progressing Goal: Will not experience complications related to urinary retention Outcome: Progressing   Problem: Pain Managment: Goal: General experience of comfort will improve and/or be controlled Outcome: Progressing   Problem: Urinary Elimination: Goal: Signs and symptoms of infection will decrease Outcome: Progressing

## 2023-11-19 NOTE — Progress Notes (Signed)
 Mobility Specialist Progress Note:   11/19/23 1000  Mobility  Activity Ambulated with assistance  Level of Assistance Modified independent, requires aide device or extra time  Assistive Device None  Distance Ambulated (ft) 30 ft  Activity Response Tolerated fair (R hip pain)  Mobility Referral Yes  Mobility visit 1 Mobility  Mobility Specialist Start Time (ACUTE ONLY) 1000  Mobility Specialist Stop Time (ACUTE ONLY) 1010  Mobility Specialist Time Calculation (min) (ACUTE ONLY) 10 min   Pt able to ambulate to/from bathroom with no assistance. C/o pain in her R hip, started 3 months ago. Pt states she is close to baseline, uses rollator and cane occasionally at home. Left sitting EOB with all needs met.   Therisa Rana Mobility Specialist Please contact via SecureChat or  Rehab office at [phone]

## 2023-11-19 NOTE — Progress Notes (Signed)
 Peripherally Inserted Central Catheter Placement  The IV Nurse has discussed with the patient and/or persons authorized to consent for the patient, the purpose of this procedure and the potential benefits and risks involved with this procedure.  The benefits include less needle sticks, lab draws from the catheter, and the patient may be discharged home with the catheter. Risks include, but not limited to, infection, bleeding, blood clot (thrombus formation), and puncture of an artery; nerve damage and irregular heartbeat and possibility to perform a PICC exchange if needed/ordered by physician.  Alternatives to this procedure were also discussed.  Bard Power PICC patient education guide, fact sheet on infection prevention and patient information card has been provided to patient /or left at bedside.    PICC Placement Documentation  PICC Single Lumen 11/19/23 Right Basilic 36 cm 0 cm (Active)  Indication for Insertion or Continuance of Line Home intravenous therapies (PICC only) 11/19/23 0930  Exposed Catheter (cm) 0 cm 11/19/23 0930  Site Assessment Clean, Dry, Intact 11/19/23 0930  Line Status Saline locked;Blood return noted 11/19/23 0930  Dressing Type Transparent;Securing device 11/19/23 0930  Dressing Status Antimicrobial disc/dressing in place;Clean, Dry, Intact 11/19/23 0930  Line Care Connections checked and tightened 11/19/23 0930  Line Adjustment (NICU/IV Team Only) No 11/19/23 0930  Dressing Intervention New dressing 11/19/23 0930  Dressing Change Due 11/26/23 11/19/23 0930       [name] 11/19/2023, 9:56 AM

## 2023-11-19 NOTE — TOC Transition Note (Signed)
 Transition of Care [facility]) - Discharge Note   Patient Details  Name: [name] MRN: [id number] Date of Birth: 1960-03-23  [facility] CM/SW Contact:  Tom-Johnson, Harvest Muskrat, RN Phone Number: 11/19/2023, 10:55 AM   Clinical Narrative:     Patient is scheduled for discharge today.  Readmission Risk Assessment done. Home health info, hospital f/u and discharge instructions on AVS. Pam with [employer] will do bedside education with patient today between 1pm and 1:30 pm.  Husband, [name] to transport at discharge.  No further ICM needs noted.        Final next level of care: Home w Home Health Services Barriers to Discharge: Barriers Resolved   Patient Goals and CMS Choice Patient states their goals for this hospitalization and ongoing recovery are:: To return home CMS Medicare.gov Compare Post Acute Care list provided to:: Patient Choice offered to / list presented to : Patient      Discharge Placement                Patient to be transferred to facility by: Husband Name of family member notified: [name]    Discharge Plan and Services Additional resources added to the After Visit Summary for                  DME Arranged: N/A DME Agency: NA       HH Arranged: RN, IV Antibiotics HH Agency: Engineer, [facility] Date [facility] Agency Contacted: 11/18/23 Time HH Agency Contacted: 1212 Representative spoke with at [facility] Agency: Burnard with [facility] for [facility] and Pam with [facility] for home IV abx infusion.  Social Drivers of Health (SDOH) Interventions SDOH Screenings   Food Insecurity: No Food Insecurity (11/15/2023)  Recent Concern: Food Insecurity - Food Insecurity Present (10/04/2023)  Housing: Low Risk  (11/15/2023)  Transportation Needs: No Transportation Needs (11/15/2023)  Utilities: Not At Risk (11/15/2023)  Depression (PHQ2-9): Low Risk  (08/06/2023)  Financial Resource Strain: Medium Risk (11/07/2021)  Tobacco Use: Medium Risk  (11/15/2023)     Readmission Risk Interventions    11/19/2023   10:52 AM 11/17/2023    1:00 PM 10/06/2023    3:11 PM  Readmission Risk Prevention Plan  Transportation Screening Complete Complete Complete  PCP or Specialist Appt within 5-7 Days  Complete   PCP or Specialist Appt within 3-5 Days Complete  Complete  Home Care Screening  Complete   Medication Review (RN CM)  Referral to Pharmacy   HRI or Home Care Consult Complete  Complete  Social Work Consult for Recovery Care Planning/Counseling Complete  Complete  Palliative Care Screening Not Applicable  Not Applicable  Medication Review (RN Care Manager) Referral to Pharmacy  Referral to Pharmacy

## 2023-11-19 NOTE — Discharge Summary (Signed)
 Physician Discharge Summary  [name] FMW:[id number] DOB: [birth year] DOA: 11/15/2023  PCP: Norleen Lynwood ORN, MD  Admit date: 11/15/2023 Discharge date: 11/19/2023  Admitted From: Home Disposition:  Home  Discharge Condition:Stable CODE STATUS:FULL Diet recommendation: Heart Healthy   Brief/Interim Summary: Patient is a 63 year old female with history of paroxysmal A-fib, asthma, diverticulosis, GERD, hyperlipidemia, hypertension, recent admission on 8/25 for right hydronephrosis due to nephroureterolithiasis requiring ureteral stent and complication with E. coli bacteremia presented with abdominal pain, bloating, nausea, vomiting.  On presentation she was tachycardic, tachypneic.  Lab work showed creatinine of 2(baseline creatinine of 1.1), leukocytosis.  CT abdomen is/pelvis showed right-sided hydronephrosis with obstructive ureteral stone at right UPJ.  Urology consulted.  Status post cystoscopy, bilateral retrograde pyelogram, bilateral ureteral stent placement on 10/5.  Blood cultures showing ESBL Klebsiella.  Urine culture showed ESBL E. coli and ESBL Klebsiella.  Antibiotic changed to ertapenem.  PICC line placed.  Medically stable for discharge today  Following problems were addressed during the hospitalization:  ESBL Klebsiella bacteremia/ESBL E. coli: Blood Culture/urine culture showing ESBL Klebsiella/E. Coli Sepsis  physiology has resolved .Leukocytosis improved.  No fever.  Consulted ID.  Antibiotic changed to ertapenem.  Plan for 2 weeks course.Underwent  PICC line placement.   AKI: Baseline creatinine 1.1.  Presented with creatinine range of 2.  Likely from obstructing renal stone. Improved with iv fluid   Right hydronephrosis/obstructive ureteral stone at right UPJ:  Urology consulted.  Status post cystoscopy, bilateral retrograde pyelogram, bilateral ureteral stent placement on 10/5.   Continue current antibiotics.  Needs to follow-up with urology as an outpatient    Paroxysmal A-fib: Currently on normal sinus rhythm.  On Toprol , Xarelto    Hypertension: Continue home medication on discharge   Morbid obesity: BMI of 52    Discharge Diagnoses:  Principal Problem:   Hydronephrosis of right kidney Active Problems:   Urinary tract infection without hematuria   Kidney stone    Discharge Instructions  Discharge Instructions     [facility] pharmacist to adjust dose for Vancomycin , Aminoglycosides and other anti-infective therapies as requested by physician.   Complete by: As directed    [facility] to provide Cath Flo 2mg    Complete by: As directed    Administer for PICC line occlusion and as ordered by physician for other access device issues.   Anaphylaxis Kit: Provided to treat any anaphylactic reaction to the medication being provided to the patient if First Dose or when requested by physician   Complete by: As directed    Epinephrine  1mg /ml vial / amp: Administer 0.3mg  (0.82ml) subcutaneously once for moderate to severe anaphylaxis, nurse to call physician and pharmacy when reaction occurs and call 911 if needed for immediate care   Diphenhydramine  50mg /ml IV vial: Administer 25-50mg  IV/IM PRN for first dose reaction, rash, itching, mild reaction, nurse to call physician and pharmacy when reaction occurs   Sodium Chloride  0.9% NS 500ml IV: Administer if needed for hypovolemic blood pressure drop or as ordered by physician after call to physician with anaphylactic reaction   Change dressing on IV access line weekly and PRN   Complete by: As directed    Diet - low sodium heart healthy   Complete by: As directed    Discharge instructions   Complete by: As directed    1)Please take your medications as instructed 2)Follow up with your PCP in a week 3)Follow up with urology as an outpatient   Flush IV access with Sodium Chloride   0.9% and Heparin  10 units/ml or 100 units/ml   Complete by: As directed    Home infusion  instructions - [facility]   Complete by: As directed    Instructions: Flush IV access with Sodium Chloride  0.9% and Heparin  10units/ml or 100units/ml   Change dressing on IV access line: Weekly and PRN   Instructions Cath Flo 2mg : Administer for PICC Line occlusion and as ordered by physician for other access device   [facility] pharmacist to adjust dose for: Vancomycin , Aminoglycosides and other anti-infective therapies as requested by physician   Increase activity slowly   Complete by: As directed    Method of administration may be changed at the discretion of home infusion pharmacist based upon assessment of the patient and/or caregiver's ability to self-administer the medication ordered   Complete by: As directed    No wound care   Complete by: As directed       Allergies as of 11/19/2023       Reactions   Morphine  Itching   Shrimp [shellfish Allergy] Itching, Other (See Comments)   Tongue burns also   Tramadol  Other (See Comments)   Caused confusion   Covid-19 (mrna) Vaccine Hives   Diltiazem  Hcl Itching   Pt with itching of the feet when bolus given   Latex Rash        Medication List     TAKE these medications    albuterol  108 (90 Base) MCG/ACT inhaler Commonly known as: VENTOLIN  HFA INHALE 2 PUFFS INTO THE LUNGS EVERY 6 HOURS AS NEEDED   atorvastatin  20 MG tablet Commonly known as: LIPITOR Take 1 tablet (20 mg total) by mouth daily.   calcium  carbonate 500 MG chewable tablet Commonly known as: TUMS - dosed in mg elemental calcium  Chew 1 tablet by mouth daily as needed for indigestion or heartburn.   CLEAR EYES FOR DRY EYES OP Place 1 drop into both eyes daily as needed (Dry eyes).   docusate sodium  100 MG capsule Commonly known as: Colace Take 1 capsule (100 mg total) by mouth daily as needed for up to 30 doses.   ertapenem IVPB Commonly known as: INVANZ Inject 1 g into the vein daily for 13 days. Indication:  ESBL Klebsiella  pneumoniae bacteremia First Dose: Yes Last Day of Therapy:  12/01/2023 Labs - Once weekly:  CBC/D and BMP, Labs - Once weekly: ESR and CRP Method of administration: Mini-Bag Plus / Gravity Method of administration may be changed at the discretion of home infusion pharmacist based upon assessment of the patient and/or caregiver's ability to self-administer the medication ordered.   famotidine  20 MG tablet Commonly known as: PEPCID  Take 1 tablet (20 mg total) by mouth 2 (two) times daily.   fluticasone  furoate-vilanterol 200-25 MCG/ACT Aepb Commonly known as: BREO ELLIPTA  Inhale 1 puff into the lungs daily.   LORazepam  0.5 MG tablet Commonly known as: ATIVAN  Take 1 tablet (0.5 mg total) by mouth 2 (two) times daily as needed for anxiety.   losartan -hydrochlorothiazide  100-25 MG tablet Commonly known as: HYZAAR Take 1 tablet by mouth daily.   metoprolol  succinate 25 MG 24 hr tablet Commonly known as: TOPROL -XL Take 1 tablet (25 mg total) by mouth daily.   ondansetron  4 MG disintegrating tablet Commonly known as: ZOFRAN -ODT TAKE 1 TABLET BY MOUTH EVERY 8 HOURS AS NEEDED FOR NAUSEA AND VOMITING   oxyCODONE -acetaminophen  5-325 MG tablet Commonly known as: PERCOCET/ROXICET Take 1 tablet by mouth every 4 (four) hours as needed for up  to 18 doses.   potassium chloride  10 MEQ tablet Commonly known as: Klor-Con  10 Take 1 tablet (10 mEq total) by mouth daily.   rivaroxaban  20 MG Tabs tablet Commonly known as: XARELTO  Take 1 tablet (20 mg total) by mouth daily with supper.   triamcinolone  cream 0.1 % Commonly known as: KENALOG  APPLY 1 APPLICATION TOPICALLY 2 (TWO) TIMES DAILY AS NEEDED (ITCHING).               Durable Medical Equipment  (From admission, onward)           Start     Ordered   11/17/23 1318  For home use only DME 4 wheeled rolling walker with seat  Once       Question:  Patient needs a walker to treat with the following condition  Answer:  Gait  instability   11/17/23 1317              Discharge Care Instructions  (From admission, onward)           Start     Ordered   11/19/23 0000  Change dressing on IV access line weekly and PRN  (Home infusion instructions - [facility] )        11/19/23 1038            Follow-up Information     [facility].   Specialty: Home Health Services Why: Someone will call you to schedule first home visit. Contact information: [address] [place] [address] [phone]         Norleen Lynwood ORN, MD. Schedule an appointment as soon as possible for a visit in 1 week(s).   Specialties: Internal Medicine, Radiology Contact information: [address] [place] [address] [phone]                Allergies  Allergen Reactions   Morphine  Itching   Shrimp [Shellfish Allergy] Itching and Other (See Comments)    Tongue burns also   Tramadol  Other (See Comments)    Caused confusion   Covid-19 (Mrna) Vaccine Hives   Diltiazem  Hcl Itching    Pt with itching of the feet when bolus given   Latex Rash    Consultations: Urology,ID   Procedures/Studies: US  EKG SITE RITE Result Date: 11/18/2023 If Site Rite image not attached, placement could not be confirmed due to current cardiac rhythm.  DG C-Arm 1-60 Min-No Report Result Date: 11/15/2023 Fluoroscopy was utilized by the requesting physician.  No radiographic interpretation.   CT L-SPINE NO CHARGE Result Date: 11/15/2023 CLINICAL DATA:  Back pain. EXAM: CT LUMBAR SPINE WITHOUT CONTRAST TECHNIQUE: Multidetector CT imaging of the lumbar spine was performed without intravenous contrast administration. Multiplanar CT image reconstructions were also generated. RADIATION DOSE REDUCTION: This exam was performed according to the departmental dose-optimization program which includes automated exposure control, adjustment of the mA and/or kV according to patient size and/or use  of iterative reconstruction technique. COMPARISON:  None Available. FINDINGS: Segmentation: 5 lumbar type vertebrae. Alignment: Normal. Vertebrae: No acute fracture or focal pathologic process. Paraspinal and other soft tissues: Negative. Disc levels: T12-L1: Unremarkable. L1-2: Mild posterior broad-based bulging disc. L2-3: Posterior broad-based disc bulge with mild multifactorial central canal stenosis. L3-4: Posterior broad-based bulging disc. Bilateral facet overgrowth with thickening of the ligamentum flavum. Mild to moderate central canal stenosis with mild foraminal encroachment bilaterally, left greater than right. L4-5: Posterior broad-based bulging disc with component of disc uncovering. Facet  overgrowth. Moderate central canal multifactorial stenosis with minimal biforaminal encroachment. L5-S1: Posterior broad-based disc osteophyte complex. Patent foramina. IMPRESSION: 1. No acute fracture or subluxation of the lumbar spine. 2. Multilevel degenerative disc disease with varying degrees of multifactorial central canal stenosis at L2-3, L3-4 and L4-5. Electronically Signed   By: Camellia Candle M.D.   On: 11/15/2023 06:44   CT ABDOMEN PELVIS WO CONTRAST Result Date: 11/15/2023 CLINICAL DATA:  Abdominal pain and nausea.  Back pain. EXAM: CT ABDOMEN AND PELVIS WITHOUT CONTRAST TECHNIQUE: Multidetector CT imaging of the abdomen and pelvis was performed following the standard protocol without IV contrast. RADIATION DOSE REDUCTION: This exam was performed according to the departmental dose-optimization program which includes automated exposure control, adjustment of the mA and/or kV according to patient size and/or use of iterative reconstruction technique. COMPARISON:  CT stone study 10/04/2023 FINDINGS: Lower chest: Dependent atelectasis or scarring in the lung bases. Hepatobiliary: Liver is diffusely decreased in attenuation consistent with steatosis. For hyperattenuating lesions are again identified stable  since prior and also comparing back to 06/13/2021. These of been previously characterized as benign hemangiomas. There is no evidence for gallstones, gallbladder wall thickening, or pericholecystic fluid. No intrahepatic or extrahepatic biliary dilation. Pancreas: No focal mass lesion. No dilatation of the main duct. No intraparenchymal cyst. No peripancreatic edema. Spleen: No splenomegaly. No suspicious focal mass lesion. Adrenals/Urinary Tract: No adrenal nodule or mass. Punctate stone identified interpolar right kidney with 5 mm stone in the lower pole right kidney. There is mild to moderate right hydronephrosis with a 10 x 7 x 8 mm stone at the right UPJ (48/3). No right ureteral stone. Adjacent small stones in the lower pole of the left kidney measure up to about 5-6 mm maximum size and are nonobstructing. Left ureter unremarkable. Bladder is decompressed. Gas in the decompressed bladder lumen is presumably secondary to recent instrumentation, but infection could cause this appearance. Stomach/Bowel: Stomach is unremarkable. No gastric wall thickening. No evidence of outlet obstruction. Duodenum is normally positioned as is the ligament of Treitz. No small bowel wall thickening. No small bowel dilatation. The terminal ileum is normal. The appendix is normal. No gross colonic mass. No colonic wall thickening. Diverticular changes are noted in the left colon without evidence of diverticulitis. Vascular/Lymphatic: There is moderate atherosclerotic calcification of the abdominal aorta without aneurysm. There is no gastrohepatic or hepatoduodenal ligament lymphadenopathy. No retroperitoneal or mesenteric lymphadenopathy. No pelvic sidewall lymphadenopathy. Reproductive: Hysterectomy.  There is no adnexal mass. Other:  No intraperitoneal free fluid. Musculoskeletal: No worrisome lytic or sclerotic osseous abnormality. IMPRESSION: 1. 10 x 7 x 8 mm stone at the right UPJ with mild to moderate right hydronephrosis. 2.  Additional bilateral nonobstructing renal stones. 3. Hepatic steatosis. 4. Gas in the decompressed bladder lumen is presumably secondary to recent instrumentation, but bladder infection could cause this appearance. 5.  Aortic Atherosclerosis (ICD10-I70.0). Electronically Signed   By: Camellia Candle M.D.   On: 11/15/2023 06:18   DG C-Arm 1-60 Min-No Report Result Date: 10/30/2023 Fluoroscopy was utilized by the requesting physician.  No radiographic interpretation.      Subjective: Patient seen and examined at bedside today.  Hemodynamically stable.  Underwent PICC line placement.  Medically stable for discharge today.    Discharge Exam: Vitals:   11/19/23 0656 11/19/23 0737  BP: (!) 157/96 (!) 155/96  Pulse: 82 91  Resp: 17 18  Temp: 98.4 F (36.9 C) 97.9 F (36.6 C)  SpO2: 98% 98%   Vitals:  11/18/23 2200 11/19/23 0620 11/19/23 0656 11/19/23 0737  BP: (!) 149/85  (!) 157/96 (!) 155/96  Pulse: 87  82 91  Resp: 18  17 18   Temp: 97.8 F (36.6 C)  98.4 F (36.9 C) 97.9 F (36.6 C)  TempSrc:    Oral  SpO2: 100%  98% 98%  Weight:  133.8 kg    Height:        General: Pt is alert, awake, not in acute distress,obese Cardiovascular: RRR, S1/S2 +, no rubs, no gallops Respiratory: CTA bilaterally, no wheezing, no rhonchi Abdominal: Soft, NT, ND, bowel sounds + Extremities: no edema, no cyanosis    The results of significant diagnostics from this hospitalization (including imaging, microbiology, ancillary and laboratory) are listed below for reference.     Microbiology: Recent Results (from the past 240 hours)  Urine Culture     Status: Abnormal   Collection Time: 11/15/23  7:31 AM   Specimen: Urine, Clean Catch  Result Value Ref Range Status   Specimen Description URINE, CLEAN CATCH  Final   Special Requests   Final    NONE Performed at [facility], [address] [place], [place] [address]    Culture (A)  Final    >=100,000 COLONIES/mL ESCHERICHIA  COLI 70,000 COLONIES/mL KLEBSIELLA PNEUMONIAE Confirmed Extended Spectrum Beta-Lactamase Producer (ESBL).  In bloodstream infections from ESBL organisms, carbapenems are preferred over piperacillin /tazobactam. They are shown to have a lower risk of mortality. KLEBSIELLA PNEUMONIAE ESCHERICHIA COLI    Report Status 11/18/2023 FINAL  Final   Organism ID, Bacteria ESCHERICHIA COLI (A)  Final   Organism ID, Bacteria KLEBSIELLA PNEUMONIAE (A)  Final      Susceptibility   Escherichia coli - MIC*    AMPICILLIN >=32 RESISTANT Resistant     CEFAZOLIN  (URINE) Value in next row Resistant      >=32 RESISTANTThis is a modified FDA-approved test that has been validated and its performance characteristics determined by the reporting laboratory.  This laboratory is certified under the Clinical Laboratory Improvement Amendments CLIA as qualified to perform high complexity clinical laboratory testing.    CEFEPIME  Value in next row Resistant      >=32 RESISTANTThis is a modified FDA-approved test that has been validated and its performance characteristics determined by the reporting laboratory.  This laboratory is certified under the Clinical Laboratory Improvement Amendments CLIA as qualified to perform high complexity clinical laboratory testing.    ERTAPENEM Value in next row Sensitive      >=32 RESISTANTThis is a modified FDA-approved test that has been validated and its performance characteristics determined by the reporting laboratory.  This laboratory is certified under the Clinical Laboratory Improvement Amendments CLIA as qualified to perform high complexity clinical laboratory testing.    CEFTRIAXONE  Value in next row Resistant      >=32 RESISTANTThis is a modified FDA-approved test that has been validated and its performance characteristics determined by the reporting laboratory.  This laboratory is certified under the Clinical Laboratory Improvement Amendments CLIA as qualified to perform high complexity  clinical laboratory testing.    CIPROFLOXACIN  Value in next row Sensitive      >=32 RESISTANTThis is a modified FDA-approved test that has been validated and its performance characteristics determined by the reporting laboratory.  This laboratory is certified under the Clinical Laboratory Improvement Amendments CLIA as qualified to perform high complexity clinical laboratory testing.    GENTAMICIN Value in next row Intermediate      >=32 RESISTANTThis is a modified  FDA-approved test that has been validated and its performance characteristics determined by the reporting laboratory.  This laboratory is certified under the Clinical Laboratory Improvement Amendments CLIA as qualified to perform high complexity clinical laboratory testing.    NITROFURANTOIN Value in next row Sensitive      >=32 RESISTANTThis is a modified FDA-approved test that has been validated and its performance characteristics determined by the reporting laboratory.  This laboratory is certified under the Clinical Laboratory Improvement Amendments CLIA as qualified to perform high complexity clinical laboratory testing.    TRIMETH/SULFA Value in next row Sensitive      >=32 RESISTANTThis is a modified FDA-approved test that has been validated and its performance characteristics determined by the reporting laboratory.  This laboratory is certified under the Clinical Laboratory Improvement Amendments CLIA as qualified to perform high complexity clinical laboratory testing.    AMPICILLIN/SULBACTAM Value in next row Intermediate      >=32 RESISTANTThis is a modified FDA-approved test that has been validated and its performance characteristics determined by the reporting laboratory.  This laboratory is certified under the Clinical Laboratory Improvement Amendments CLIA as qualified to perform high complexity clinical laboratory testing.    PIP/TAZO Value in next row Sensitive      <=4 SENSITIVEThis is a modified FDA-approved test that has  been validated and its performance characteristics determined by the reporting laboratory.  This laboratory is certified under the Clinical Laboratory Improvement Amendments CLIA as qualified to perform high complexity clinical laboratory testing.    MEROPENEM Value in next row Sensitive      <=4 SENSITIVEThis is a modified FDA-approved test that has been validated and its performance characteristics determined by the reporting laboratory.  This laboratory is certified under the Clinical Laboratory Improvement Amendments CLIA as qualified to perform high complexity clinical laboratory testing.    * >=100,000 COLONIES/mL ESCHERICHIA COLI   Klebsiella pneumoniae - MIC*    AMPICILLIN Value in next row Resistant      <=4 SENSITIVEThis is a modified FDA-approved test that has been validated and its performance characteristics determined by the reporting laboratory.  This laboratory is certified under the Clinical Laboratory Improvement Amendments CLIA as qualified to perform high complexity clinical laboratory testing.    CEFAZOLIN  (URINE) Value in next row Resistant      >=32 RESISTANTThis is a modified FDA-approved test that has been validated and its performance characteristics determined by the reporting laboratory.  This laboratory is certified under the Clinical Laboratory Improvement Amendments CLIA as qualified to perform high complexity clinical laboratory testing.    CEFEPIME  Value in next row Resistant      >=32 RESISTANTThis is a modified FDA-approved test that has been validated and its performance characteristics determined by the reporting laboratory.  This laboratory is certified under the Clinical Laboratory Improvement Amendments CLIA as qualified to perform high complexity clinical laboratory testing.    ERTAPENEM Value in next row Sensitive      >=32 RESISTANTThis is a modified FDA-approved test that has been validated and its performance characteristics determined by the reporting  laboratory.  This laboratory is certified under the Clinical Laboratory Improvement Amendments CLIA as qualified to perform high complexity clinical laboratory testing.    CEFTRIAXONE  Value in next row Resistant      >=32 RESISTANTThis is a modified FDA-approved test that has been validated and its performance characteristics determined by the reporting laboratory.  This laboratory is certified under the Clinical Laboratory Improvement Amendments CLIA as qualified to perform high complexity  clinical laboratory testing.    CIPROFLOXACIN  Value in next row Sensitive      >=32 RESISTANTThis is a modified FDA-approved test that has been validated and its performance characteristics determined by the reporting laboratory.  This laboratory is certified under the Clinical Laboratory Improvement Amendments CLIA as qualified to perform high complexity clinical laboratory testing.    GENTAMICIN Value in next row Sensitive      >=32 RESISTANTThis is a modified FDA-approved test that has been validated and its performance characteristics determined by the reporting laboratory.  This laboratory is certified under the Clinical Laboratory Improvement Amendments CLIA as qualified to perform high complexity clinical laboratory testing.    NITROFURANTOIN Value in next row Intermediate      >=32 RESISTANTThis is a modified FDA-approved test that has been validated and its performance characteristics determined by the reporting laboratory.  This laboratory is certified under the Clinical Laboratory Improvement Amendments CLIA as qualified to perform high complexity clinical laboratory testing.    TRIMETH/SULFA Value in next row Sensitive      >=32 RESISTANTThis is a modified FDA-approved test that has been validated and its performance characteristics determined by the reporting laboratory.  This laboratory is certified under the Clinical Laboratory Improvement Amendments CLIA as qualified to perform high complexity clinical  laboratory testing.    AMPICILLIN/SULBACTAM Value in next row Intermediate      >=32 RESISTANTThis is a modified FDA-approved test that has been validated and its performance characteristics determined by the reporting laboratory.  This laboratory is certified under the Clinical Laboratory Improvement Amendments CLIA as qualified to perform high complexity clinical laboratory testing.    PIP/TAZO Value in next row Sensitive      <=4 SENSITIVEThis is a modified FDA-approved test that has been validated and its performance characteristics determined by the reporting laboratory.  This laboratory is certified under the Clinical Laboratory Improvement Amendments CLIA as qualified to perform high complexity clinical laboratory testing.    MEROPENEM Value in next row Sensitive      <=4 SENSITIVEThis is a modified FDA-approved test that has been validated and its performance characteristics determined by the reporting laboratory.  This laboratory is certified under the Clinical Laboratory Improvement Amendments CLIA as qualified to perform high complexity clinical laboratory testing.    * 70,000 COLONIES/mL KLEBSIELLA PNEUMONIAE  Aerobic/Anaerobic Culture w Gram Stain (surgical/deep wound)     Status: None   Collection Time: 11/15/23 12:54 PM   Specimen: Path fluid; Body Fluid  Result Value Ref Range Status   Specimen Description FLUID  Final   Special Requests RIGHT RENAL PELVIS CULTURE  Final   Gram Stain   Final    FEW WBC PRESENT, PREDOMINANTLY PMN MODERATE GRAM NEGATIVE RODS    Culture   Final    ABUNDANT ESCHERICHIA COLI Confirmed Extended Spectrum Beta-Lactamase Producer (ESBL).  In bloodstream infections from ESBL organisms, carbapenems are preferred over piperacillin /tazobactam. They are shown to have a lower risk of mortality. NO ANAEROBES ISOLATED Performed at [facility], [address], [place], [place] [address]    Report Status 11/18/2023 FINAL  Final   Organism ID, Bacteria  ESCHERICHIA COLI  Final      Susceptibility   Escherichia coli - MIC*    AMPICILLIN >=32 RESISTANT Resistant     CEFAZOLIN  (NON-URINE) >=32 RESISTANT Resistant     CEFEPIME  16 RESISTANT Resistant     ERTAPENEM <=0.12 SENSITIVE Sensitive     CEFTRIAXONE  >=64 RESISTANT Resistant     CIPROFLOXACIN  <=  0.06 SENSITIVE Sensitive     GENTAMICIN 8 INTERMEDIATE Intermediate     MEROPENEM <=0.25 SENSITIVE Sensitive     TRIMETH/SULFA <=20 SENSITIVE Sensitive     AMPICILLIN/SULBACTAM 16 INTERMEDIATE Intermediate     PIP/TAZO Value in next row Sensitive      <=4 SENSITIVEThis is a modified FDA-approved test that has been validated and its performance characteristics determined by the reporting laboratory.  This laboratory is certified under the Clinical Laboratory Improvement Amendments CLIA as qualified to perform high complexity clinical laboratory testing.    * ABUNDANT ESCHERICHIA COLI  Blood culture (routine x 2)     Status: Abnormal   Collection Time: 11/15/23  4:46 PM   Specimen: BLOOD LEFT ARM  Result Value Ref Range Status   Specimen Description BLOOD LEFT ARM  Final   Special Requests   Final    BOTTLES DRAWN AEROBIC AND ANAEROBIC Blood Culture results may not be optimal due to an inadequate volume of blood received in culture bottles   Culture  Setup Time   Final    GRAM NEGATIVE RODS IN BOTH AEROBIC AND ANAEROBIC BOTTLES CRITICAL VALUE NOTED.  VALUE IS CONSISTENT WITH PREVIOUSLY REPORTED AND CALLED VALUE.    Culture (A)  Final    KLEBSIELLA PNEUMONIAE SUSCEPTIBILITIES PERFORMED ON PREVIOUS CULTURE WITHIN THE LAST 5 DAYS. Performed at [facility], [address] [place], [place] [address]    Report Status 11/18/2023 FINAL  Final  Blood culture (routine x 2)     Status: Abnormal   Collection Time: 11/15/23  4:55 PM   Specimen: BLOOD LEFT HAND  Result Value Ref Range Status   Specimen Description BLOOD LEFT HAND  Final   Special Requests   Final    BOTTLES DRAWN AEROBIC AND  ANAEROBIC Blood Culture results may not be optimal due to an inadequate volume of blood received in culture bottles   Culture  Setup Time   Final    GRAM NEGATIVE RODS IN BOTH AEROBIC AND ANAEROBIC BOTTLES CRITICAL RESULT CALLED TO, READ BACK BY AND VERIFIED WITH: PHARMD B WANNARAT [id number] AT 1037 BY CM    Culture (A)  Final    KLEBSIELLA PNEUMONIAE ESCHERICHIA COLI Confirmed Extended Spectrum Beta-Lactamase Producer (ESBL).  In bloodstream infections from ESBL organisms, carbapenems are preferred over piperacillin /tazobactam. They are shown to have a lower risk of mortality. CRITICAL RESULT CALLED TO, READ BACK BY AND VERIFIED WITH: PHARMD B.WANNARAT AT 0902 ON 11/19/2023 BY T.SAAD. Performed at [facility], [address], [place], [place] [address]    Report Status 11/19/2023 FINAL  Final   Organism ID, Bacteria KLEBSIELLA PNEUMONIAE  Final   Organism ID, Bacteria ESCHERICHIA COLI  Final      Susceptibility   Escherichia coli - MIC*    AMPICILLIN >=32 RESISTANT Resistant     CEFAZOLIN  (NON-URINE) >=32 RESISTANT Resistant     CEFEPIME  16 RESISTANT Resistant     ERTAPENEM <=0.12 SENSITIVE Sensitive     CEFTRIAXONE  >=64 RESISTANT Resistant     CIPROFLOXACIN  <=0.06 SENSITIVE Sensitive     GENTAMICIN 8 INTERMEDIATE Intermediate     MEROPENEM <=0.25 SENSITIVE Sensitive     TRIMETH/SULFA <=20 SENSITIVE Sensitive     AMPICILLIN/SULBACTAM 4 SENSITIVE Sensitive     PIP/TAZO Value in next row Sensitive      <=4 SENSITIVEThis is a modified FDA-approved test that has been validated and its performance characteristics determined by the reporting laboratory.  This laboratory is certified under the Clinical Laboratory  Improvement Amendments CLIA as qualified to perform high complexity clinical laboratory testing.    * ESCHERICHIA COLI   Klebsiella pneumoniae - MIC*    AMPICILLIN Value in next row Resistant      <=4 SENSITIVEThis is a modified FDA-approved test that has been validated and  its performance characteristics determined by the reporting laboratory.  This laboratory is certified under the Clinical Laboratory Improvement Amendments CLIA as qualified to perform high complexity clinical laboratory testing.    CEFAZOLIN  (NON-URINE) Value in next row Resistant      <=4 SENSITIVEThis is a modified FDA-approved test that has been validated and its performance characteristics determined by the reporting laboratory.  This laboratory is certified under the Clinical Laboratory Improvement Amendments CLIA as qualified to perform high complexity clinical laboratory testing.    CEFEPIME  Value in next row Resistant      <=4 SENSITIVEThis is a modified FDA-approved test that has been validated and its performance characteristics determined by the reporting laboratory.  This laboratory is certified under the Clinical Laboratory Improvement Amendments CLIA as qualified to perform high complexity clinical laboratory testing.    ERTAPENEM Value in next row Sensitive      <=4 SENSITIVEThis is a modified FDA-approved test that has been validated and its performance characteristics determined by the reporting laboratory.  This laboratory is certified under the Clinical Laboratory Improvement Amendments CLIA as qualified to perform high complexity clinical laboratory testing.    CEFTRIAXONE  Value in next row Resistant      <=4 SENSITIVEThis is a modified FDA-approved test that has been validated and its performance characteristics determined by the reporting laboratory.  This laboratory is certified under the Clinical Laboratory Improvement Amendments CLIA as qualified to perform high complexity clinical laboratory testing.    CIPROFLOXACIN  Value in next row Sensitive      <=4 SENSITIVEThis is a modified FDA-approved test that has been validated and its performance characteristics determined by the reporting laboratory.  This laboratory is certified under the Clinical Laboratory Improvement Amendments  CLIA as qualified to perform high complexity clinical laboratory testing.    GENTAMICIN Value in next row Intermediate      <=4 SENSITIVEThis is a modified FDA-approved test that has been validated and its performance characteristics determined by the reporting laboratory.  This laboratory is certified under the Clinical Laboratory Improvement Amendments CLIA as qualified to perform high complexity clinical laboratory testing.    MEROPENEM Value in next row Sensitive      <=4 SENSITIVEThis is a modified FDA-approved test that has been validated and its performance characteristics determined by the reporting laboratory.  This laboratory is certified under the Clinical Laboratory Improvement Amendments CLIA as qualified to perform high complexity clinical laboratory testing.    TRIMETH/SULFA Value in next row Sensitive      <=4 SENSITIVEThis is a modified FDA-approved test that has been validated and its performance characteristics determined by the reporting laboratory.  This laboratory is certified under the Clinical Laboratory Improvement Amendments CLIA as qualified to perform high complexity clinical laboratory testing.    AMPICILLIN/SULBACTAM Value in next row Sensitive      <=4 SENSITIVEThis is a modified FDA-approved test that has been validated and its performance characteristics determined by the reporting laboratory.  This laboratory is certified under the Clinical Laboratory Improvement Amendments CLIA as qualified to perform high complexity clinical laboratory testing.    PIP/TAZO Value in next row Sensitive      <=4 SENSITIVEThis is a modified FDA-approved test that has  been validated and its performance characteristics determined by the reporting laboratory.  This laboratory is certified under the Clinical Laboratory Improvement Amendments CLIA as qualified to perform high complexity clinical laboratory testing.    * KLEBSIELLA PNEUMONIAE  Blood Culture ID Panel (Reflexed)     Status:  Abnormal   Collection Time: 11/15/23  4:55 PM  Result Value Ref Range Status   Enterococcus faecalis NOT DETECTED NOT DETECTED Final   Enterococcus Faecium NOT DETECTED NOT DETECTED Final   Listeria monocytogenes NOT DETECTED NOT DETECTED Final   Staphylococcus species NOT DETECTED NOT DETECTED Final   Staphylococcus aureus (BCID) NOT DETECTED NOT DETECTED Final   Staphylococcus epidermidis NOT DETECTED NOT DETECTED Final   Staphylococcus lugdunensis NOT DETECTED NOT DETECTED Final   Streptococcus species NOT DETECTED NOT DETECTED Final   Streptococcus agalactiae NOT DETECTED NOT DETECTED Final   Streptococcus pneumoniae NOT DETECTED NOT DETECTED Final   Streptococcus pyogenes NOT DETECTED NOT DETECTED Final   A.calcoaceticus-baumannii NOT DETECTED NOT DETECTED Final   Bacteroides fragilis NOT DETECTED NOT DETECTED Final   Enterobacterales DETECTED (A) NOT DETECTED Final    Comment: CRITICAL RESULT CALLED TO, READ BACK BY AND VERIFIED WITH: PHARMD B WANNARAT [id number] AT 1037 BY CM    Enterobacter cloacae complex NOT DETECTED NOT DETECTED Final   Escherichia coli NOT DETECTED NOT DETECTED Final   Klebsiella aerogenes NOT DETECTED NOT DETECTED Final   Klebsiella oxytoca NOT DETECTED NOT DETECTED Final   Klebsiella pneumoniae DETECTED (A) NOT DETECTED Final    Comment: CRITICAL RESULT CALLED TO, READ BACK BY AND VERIFIED WITH: PHARMD B WANNARAT [id number] AT 1037 BY CM    Proteus species NOT DETECTED NOT DETECTED Final   Salmonella species NOT DETECTED NOT DETECTED Final   Serratia marcescens NOT DETECTED NOT DETECTED Final   Haemophilus influenzae NOT DETECTED NOT DETECTED Final   Neisseria meningitidis NOT DETECTED NOT DETECTED Final   Pseudomonas aeruginosa NOT DETECTED NOT DETECTED Final   Stenotrophomonas maltophilia NOT DETECTED NOT DETECTED Final   Candida albicans NOT DETECTED NOT DETECTED Final   Candida auris NOT DETECTED NOT DETECTED Final   Candida glabrata NOT DETECTED NOT  DETECTED Final   Candida krusei NOT DETECTED NOT DETECTED Final   Candida parapsilosis NOT DETECTED NOT DETECTED Final   Candida tropicalis NOT DETECTED NOT DETECTED Final   Cryptococcus neoformans/gattii NOT DETECTED NOT DETECTED Final   CTX-M ESBL DETECTED (A) NOT DETECTED Final    Comment: CRITICAL RESULT CALLED TO, READ BACK BY AND VERIFIED WITH: PHARMD B WANNARAT [id number] AT 1037 BY CM (NOTE) Extended spectrum beta-lactamase detected. Recommend a carbapenem as initial therapy.      Carbapenem resistance IMP NOT DETECTED NOT DETECTED Final   Carbapenem resistance KPC NOT DETECTED NOT DETECTED Final   Carbapenem resistance NDM NOT DETECTED NOT DETECTED Final   Carbapenem resist OXA 48 LIKE NOT DETECTED NOT DETECTED Final   Carbapenem resistance VIM NOT DETECTED NOT DETECTED Final    Comment: Performed at [facility], [address] [place], [place] [address]  Culture, blood (Routine X 2) w Reflex to ID Panel     Status: None (Preliminary result)   Collection Time: 11/18/23  9:32 AM   Specimen: BLOOD LEFT ARM  Result Value Ref Range Status   Specimen Description BLOOD LEFT ARM  Final   Special Requests   Final    BOTTLES DRAWN AEROBIC AND ANAEROBIC Blood Culture adequate volume   Culture   Final    NO  GROWTH < 24 HOURS Performed at [facility], [address] [place], [place] [address]    Report Status PENDING  Incomplete  Culture, blood (Routine X 2) w Reflex to ID Panel     Status: None (Preliminary result)   Collection Time: 11/18/23  9:42 AM   Specimen: BLOOD RIGHT HAND  Result Value Ref Range Status   Specimen Description BLOOD RIGHT HAND  Final   Special Requests   Final    BOTTLES DRAWN AEROBIC AND ANAEROBIC Blood Culture adequate volume   Culture   Final    NO GROWTH < 24 HOURS Performed at [facility], [address] [place], [place] [address]    Report Status PENDING  Incomplete     Labs: BNP (last 3 results) Recent Labs     04/17/23 1257  BNP 117.2*   Basic Metabolic Panel: Recent Labs  Lab 11/15/23 0405 11/16/23 0945 11/17/23 0238 11/18/23 0211  NA 138 137 138 138  K 3.9 4.4 4.2 4.2  CL 102 102 104 102  CO2 20* 23 26 26   GLUCOSE 122* 192* 120* 110*  BUN 24* 36* 37* 29*  CREATININE 2.04* 2.14* 1.67* 1.26*  CALCIUM  8.8* 8.0* 8.3* 8.4*   Liver Function Tests: Recent Labs  Lab 11/15/23 0405  AST 41  ALT 33  ALKPHOS 113  BILITOT 0.8  PROT 7.4  ALBUMIN 3.0*   Recent Labs  Lab 11/15/23 0405  LIPASE 15   No results for input(s): AMMONIA in the last 168 hours. CBC: Recent Labs  Lab 11/15/23 0405 11/16/23 0945 11/17/23 0238 11/18/23 0211  WBC 25.5* 20.0* 13.8* 11.7*  HGB 13.3 12.5 12.3 11.6*  HCT 41.1 39.7 39.1 37.1  MCV 93.6 95.9 96.5 96.9  PLT 255 215 201 195   Cardiac Enzymes: No results for input(s): CKTOTAL, CKMB, CKMBINDEX, TROPONINI in the last 168 hours. BNP: Invalid input(s): POCBNP CBG: No results for input(s): GLUCAP in the last 168 hours. D-Dimer No results for input(s): DDIMER in the last 72 hours. Hgb A1c No results for input(s): HGBA1C in the last 72 hours. Lipid Profile No results for input(s): CHOL, HDL, LDLCALC, TRIG, CHOLHDL, LDLDIRECT in the last 72 hours. Thyroid  function studies No results for input(s): TSH, T4TOTAL, T3FREE, THYROIDAB in the last 72 hours.  Invalid input(s): FREET3 Anemia work up No results for input(s): VITAMINB12, FOLATE, FERRITIN, TIBC, IRON, RETICCTPCT in the last 72 hours. Urinalysis    Component Value Date/Time   COLORURINE YELLOW 11/15/2023 0403   APPEARANCEUR CLEAR 11/15/2023 0403   LABSPEC >1.030 (H) 11/15/2023 0403   PHURINE 6.0 11/15/2023 0403   GLUCOSEU NEGATIVE 11/15/2023 0403   GLUCOSEU NEGATIVE 12/03/2022 1536   HGBUR LARGE (A) 11/15/2023 0403   BILIRUBINUR NEGATIVE 11/15/2023 0403   BILIRUBINUR negative 09/16/2011 1047   KETONESUR NEGATIVE 11/15/2023 0403    PROTEINUR 30 (A) 11/15/2023 0403   UROBILINOGEN 0.2 12/03/2022 1536   NITRITE POSITIVE (A) 11/15/2023 0403   LEUKOCYTESUR SMALL (A) 11/15/2023 0403   Sepsis Labs Recent Labs  Lab 11/15/23 0405 11/16/23 0945 11/17/23 0238 11/18/23 0211  WBC 25.5* 20.0* 13.8* 11.7*   Microbiology Recent Results (from the past 240 hours)  Urine Culture     Status: Abnormal   Collection Time: 11/15/23  7:31 AM   Specimen: Urine, Clean Catch  Result Value Ref Range Status   Specimen Description URINE, CLEAN CATCH  Final   Special Requests   Final    NONE Performed at [facility]  Lab, [address], [place], [place] [address]    Culture (A)  Final    >=100,000 COLONIES/mL ESCHERICHIA COLI 70,000 COLONIES/mL KLEBSIELLA PNEUMONIAE Confirmed Extended Spectrum Beta-Lactamase Producer (ESBL).  In bloodstream infections from ESBL organisms, carbapenems are preferred over piperacillin /tazobactam. They are shown to have a lower risk of mortality. KLEBSIELLA PNEUMONIAE ESCHERICHIA COLI    Report Status 11/18/2023 FINAL  Final   Organism ID, Bacteria ESCHERICHIA COLI (A)  Final   Organism ID, Bacteria KLEBSIELLA PNEUMONIAE (A)  Final      Susceptibility   Escherichia coli - MIC*    AMPICILLIN >=32 RESISTANT Resistant     CEFAZOLIN  (URINE) Value in next row Resistant      >=32 RESISTANTThis is a modified FDA-approved test that has been validated and its performance characteristics determined by the reporting laboratory.  This laboratory is certified under the Clinical Laboratory Improvement Amendments CLIA as qualified to perform high complexity clinical laboratory testing.    CEFEPIME  Value in next row Resistant      >=32 RESISTANTThis is a modified FDA-approved test that has been validated and its performance characteristics determined by the reporting laboratory.  This laboratory is certified under the Clinical Laboratory Improvement Amendments CLIA as qualified to perform high complexity clinical  laboratory testing.    ERTAPENEM Value in next row Sensitive      >=32 RESISTANTThis is a modified FDA-approved test that has been validated and its performance characteristics determined by the reporting laboratory.  This laboratory is certified under the Clinical Laboratory Improvement Amendments CLIA as qualified to perform high complexity clinical laboratory testing.    CEFTRIAXONE  Value in next row Resistant      >=32 RESISTANTThis is a modified FDA-approved test that has been validated and its performance characteristics determined by the reporting laboratory.  This laboratory is certified under the Clinical Laboratory Improvement Amendments CLIA as qualified to perform high complexity clinical laboratory testing.    CIPROFLOXACIN  Value in next row Sensitive      >=32 RESISTANTThis is a modified FDA-approved test that has been validated and its performance characteristics determined by the reporting laboratory.  This laboratory is certified under the Clinical Laboratory Improvement Amendments CLIA as qualified to perform high complexity clinical laboratory testing.    GENTAMICIN Value in next row Intermediate      >=32 RESISTANTThis is a modified FDA-approved test that has been validated and its performance characteristics determined by the reporting laboratory.  This laboratory is certified under the Clinical Laboratory Improvement Amendments CLIA as qualified to perform high complexity clinical laboratory testing.    NITROFURANTOIN Value in next row Sensitive      >=32 RESISTANTThis is a modified FDA-approved test that has been validated and its performance characteristics determined by the reporting laboratory.  This laboratory is certified under the Clinical Laboratory Improvement Amendments CLIA as qualified to perform high complexity clinical laboratory testing.    TRIMETH/SULFA Value in next row Sensitive      >=32 RESISTANTThis is a modified FDA-approved test that has been validated and its  performance characteristics determined by the reporting laboratory.  This laboratory is certified under the Clinical Laboratory Improvement Amendments CLIA as qualified to perform high complexity clinical laboratory testing.    AMPICILLIN/SULBACTAM Value in next row Intermediate      >=32 RESISTANTThis is a modified FDA-approved test that has been validated and its performance characteristics determined by the reporting laboratory.  This laboratory is certified under the Clinical Laboratory Improvement Amendments CLIA as qualified to perform  high complexity clinical laboratory testing.    PIP/TAZO Value in next row Sensitive      <=4 SENSITIVEThis is a modified FDA-approved test that has been validated and its performance characteristics determined by the reporting laboratory.  This laboratory is certified under the Clinical Laboratory Improvement Amendments CLIA as qualified to perform high complexity clinical laboratory testing.    MEROPENEM Value in next row Sensitive      <=4 SENSITIVEThis is a modified FDA-approved test that has been validated and its performance characteristics determined by the reporting laboratory.  This laboratory is certified under the Clinical Laboratory Improvement Amendments CLIA as qualified to perform high complexity clinical laboratory testing.    * >=100,000 COLONIES/mL ESCHERICHIA COLI   Klebsiella pneumoniae - MIC*    AMPICILLIN Value in next row Resistant      <=4 SENSITIVEThis is a modified FDA-approved test that has been validated and its performance characteristics determined by the reporting laboratory.  This laboratory is certified under the Clinical Laboratory Improvement Amendments CLIA as qualified to perform high complexity clinical laboratory testing.    CEFAZOLIN  (URINE) Value in next row Resistant      >=32 RESISTANTThis is a modified FDA-approved test that has been validated and its performance characteristics determined by the reporting laboratory.  This  laboratory is certified under the Clinical Laboratory Improvement Amendments CLIA as qualified to perform high complexity clinical laboratory testing.    CEFEPIME  Value in next row Resistant      >=32 RESISTANTThis is a modified FDA-approved test that has been validated and its performance characteristics determined by the reporting laboratory.  This laboratory is certified under the Clinical Laboratory Improvement Amendments CLIA as qualified to perform high complexity clinical laboratory testing.    ERTAPENEM Value in next row Sensitive      >=32 RESISTANTThis is a modified FDA-approved test that has been validated and its performance characteristics determined by the reporting laboratory.  This laboratory is certified under the Clinical Laboratory Improvement Amendments CLIA as qualified to perform high complexity clinical laboratory testing.    CEFTRIAXONE  Value in next row Resistant      >=32 RESISTANTThis is a modified FDA-approved test that has been validated and its performance characteristics determined by the reporting laboratory.  This laboratory is certified under the Clinical Laboratory Improvement Amendments CLIA as qualified to perform high complexity clinical laboratory testing.    CIPROFLOXACIN  Value in next row Sensitive      >=32 RESISTANTThis is a modified FDA-approved test that has been validated and its performance characteristics determined by the reporting laboratory.  This laboratory is certified under the Clinical Laboratory Improvement Amendments CLIA as qualified to perform high complexity clinical laboratory testing.    GENTAMICIN Value in next row Sensitive      >=32 RESISTANTThis is a modified FDA-approved test that has been validated and its performance characteristics determined by the reporting laboratory.  This laboratory is certified under the Clinical Laboratory Improvement Amendments CLIA as qualified to perform high complexity clinical laboratory testing.     NITROFURANTOIN Value in next row Intermediate      >=32 RESISTANTThis is a modified FDA-approved test that has been validated and its performance characteristics determined by the reporting laboratory.  This laboratory is certified under the Clinical Laboratory Improvement Amendments CLIA as qualified to perform high complexity clinical laboratory testing.    TRIMETH/SULFA Value in next row Sensitive      >=32 RESISTANTThis is a modified FDA-approved test that has been validated and its performance  characteristics determined by the reporting laboratory.  This laboratory is certified under the Clinical Laboratory Improvement Amendments CLIA as qualified to perform high complexity clinical laboratory testing.    AMPICILLIN/SULBACTAM Value in next row Intermediate      >=32 RESISTANTThis is a modified FDA-approved test that has been validated and its performance characteristics determined by the reporting laboratory.  This laboratory is certified under the Clinical Laboratory Improvement Amendments CLIA as qualified to perform high complexity clinical laboratory testing.    PIP/TAZO Value in next row Sensitive      <=4 SENSITIVEThis is a modified FDA-approved test that has been validated and its performance characteristics determined by the reporting laboratory.  This laboratory is certified under the Clinical Laboratory Improvement Amendments CLIA as qualified to perform high complexity clinical laboratory testing.    MEROPENEM Value in next row Sensitive      <=4 SENSITIVEThis is a modified FDA-approved test that has been validated and its performance characteristics determined by the reporting laboratory.  This laboratory is certified under the Clinical Laboratory Improvement Amendments CLIA as qualified to perform high complexity clinical laboratory testing.    * 70,000 COLONIES/mL KLEBSIELLA PNEUMONIAE  Aerobic/Anaerobic Culture w Gram Stain (surgical/deep wound)     Status: None   Collection Time:  11/15/23 12:54 PM   Specimen: Path fluid; Body Fluid  Result Value Ref Range Status   Specimen Description FLUID  Final   Special Requests RIGHT RENAL PELVIS CULTURE  Final   Gram Stain   Final    FEW WBC PRESENT, PREDOMINANTLY PMN MODERATE GRAM NEGATIVE RODS    Culture   Final    ABUNDANT ESCHERICHIA COLI Confirmed Extended Spectrum Beta-Lactamase Producer (ESBL).  In bloodstream infections from ESBL organisms, carbapenems are preferred over piperacillin /tazobactam. They are shown to have a lower risk of mortality. NO ANAEROBES ISOLATED Performed at [facility], [address] [place], [place] [address]    Report Status 11/18/2023 FINAL  Final   Organism ID, Bacteria ESCHERICHIA COLI  Final      Susceptibility   Escherichia coli - MIC*    AMPICILLIN >=32 RESISTANT Resistant     CEFAZOLIN  (NON-URINE) >=32 RESISTANT Resistant     CEFEPIME  16 RESISTANT Resistant     ERTAPENEM <=0.12 SENSITIVE Sensitive     CEFTRIAXONE  >=64 RESISTANT Resistant     CIPROFLOXACIN  <=0.06 SENSITIVE Sensitive     GENTAMICIN 8 INTERMEDIATE Intermediate     MEROPENEM <=0.25 SENSITIVE Sensitive     TRIMETH/SULFA <=20 SENSITIVE Sensitive     AMPICILLIN/SULBACTAM 16 INTERMEDIATE Intermediate     PIP/TAZO Value in next row Sensitive      <=4 SENSITIVEThis is a modified FDA-approved test that has been validated and its performance characteristics determined by the reporting laboratory.  This laboratory is certified under the Clinical Laboratory Improvement Amendments CLIA as qualified to perform high complexity clinical laboratory testing.    * ABUNDANT ESCHERICHIA COLI  Blood culture (routine x 2)     Status: Abnormal   Collection Time: 11/15/23  4:46 PM   Specimen: BLOOD LEFT ARM  Result Value Ref Range Status   Specimen Description BLOOD LEFT ARM  Final   Special Requests   Final    BOTTLES DRAWN AEROBIC AND ANAEROBIC Blood Culture results may not be optimal due to an inadequate volume of blood  received in culture bottles   Culture  Setup Time   Final    GRAM NEGATIVE RODS IN BOTH AEROBIC AND ANAEROBIC BOTTLES CRITICAL VALUE NOTED.  VALUE IS CONSISTENT WITH PREVIOUSLY REPORTED AND CALLED VALUE.    Culture (A)  Final    KLEBSIELLA PNEUMONIAE SUSCEPTIBILITIES PERFORMED ON PREVIOUS CULTURE WITHIN THE LAST 5 DAYS. Performed at [facility], [address], [place], [place] [address]    Report Status 11/18/2023 FINAL  Final  Blood culture (routine x 2)     Status: Abnormal   Collection Time: 11/15/23  4:55 PM   Specimen: BLOOD LEFT HAND  Result Value Ref Range Status   Specimen Description BLOOD LEFT HAND  Final   Special Requests   Final    BOTTLES DRAWN AEROBIC AND ANAEROBIC Blood Culture results may not be optimal due to an inadequate volume of blood received in culture bottles   Culture  Setup Time   Final    GRAM NEGATIVE RODS IN BOTH AEROBIC AND ANAEROBIC BOTTLES CRITICAL RESULT CALLED TO, READ BACK BY AND VERIFIED WITH: PHARMD B WANNARAT [id number] AT 1037 BY CM    Culture (A)  Final    KLEBSIELLA PNEUMONIAE ESCHERICHIA COLI Confirmed Extended Spectrum Beta-Lactamase Producer (ESBL).  In bloodstream infections from ESBL organisms, carbapenems are preferred over piperacillin /tazobactam. They are shown to have a lower risk of mortality. CRITICAL RESULT CALLED TO, READ BACK BY AND VERIFIED WITH: PHARMD B.WANNARAT AT 0902 ON 11/19/2023 BY T.SAAD. Performed at [facility], [address] [place], [place] [address]    Report Status 11/19/2023 FINAL  Final   Organism ID, Bacteria KLEBSIELLA PNEUMONIAE  Final   Organism ID, Bacteria ESCHERICHIA COLI  Final      Susceptibility   Escherichia coli - MIC*    AMPICILLIN >=32 RESISTANT Resistant     CEFAZOLIN  (NON-URINE) >=32 RESISTANT Resistant     CEFEPIME  16 RESISTANT Resistant     ERTAPENEM <=0.12 SENSITIVE Sensitive     CEFTRIAXONE  >=64 RESISTANT Resistant     CIPROFLOXACIN  <=0.06 SENSITIVE Sensitive      GENTAMICIN 8 INTERMEDIATE Intermediate     MEROPENEM <=0.25 SENSITIVE Sensitive     TRIMETH/SULFA <=20 SENSITIVE Sensitive     AMPICILLIN/SULBACTAM 4 SENSITIVE Sensitive     PIP/TAZO Value in next row Sensitive      <=4 SENSITIVEThis is a modified FDA-approved test that has been validated and its performance characteristics determined by the reporting laboratory.  This laboratory is certified under the Clinical Laboratory Improvement Amendments CLIA as qualified to perform high complexity clinical laboratory testing.    * ESCHERICHIA COLI   Klebsiella pneumoniae - MIC*    AMPICILLIN Value in next row Resistant      <=4 SENSITIVEThis is a modified FDA-approved test that has been validated and its performance characteristics determined by the reporting laboratory.  This laboratory is certified under the Clinical Laboratory Improvement Amendments CLIA as qualified to perform high complexity clinical laboratory testing.    CEFAZOLIN  (NON-URINE) Value in next row Resistant      <=4 SENSITIVEThis is a modified FDA-approved test that has been validated and its performance characteristics determined by the reporting laboratory.  This laboratory is certified under the Clinical Laboratory Improvement Amendments CLIA as qualified to perform high complexity clinical laboratory testing.    CEFEPIME  Value in next row Resistant      <=4 SENSITIVEThis is a modified FDA-approved test that has been validated and its performance characteristics determined by the reporting laboratory.  This laboratory is certified under the Clinical Laboratory Improvement Amendments CLIA as qualified to perform high complexity clinical laboratory testing.    ERTAPENEM Value in next  row Sensitive      <=4 SENSITIVEThis is a modified FDA-approved test that has been validated and its performance characteristics determined by the reporting laboratory.  This laboratory is certified under the Clinical Laboratory Improvement Amendments CLIA as  qualified to perform high complexity clinical laboratory testing.    CEFTRIAXONE  Value in next row Resistant      <=4 SENSITIVEThis is a modified FDA-approved test that has been validated and its performance characteristics determined by the reporting laboratory.  This laboratory is certified under the Clinical Laboratory Improvement Amendments CLIA as qualified to perform high complexity clinical laboratory testing.    CIPROFLOXACIN  Value in next row Sensitive      <=4 SENSITIVEThis is a modified FDA-approved test that has been validated and its performance characteristics determined by the reporting laboratory.  This laboratory is certified under the Clinical Laboratory Improvement Amendments CLIA as qualified to perform high complexity clinical laboratory testing.    GENTAMICIN Value in next row Intermediate      <=4 SENSITIVEThis is a modified FDA-approved test that has been validated and its performance characteristics determined by the reporting laboratory.  This laboratory is certified under the Clinical Laboratory Improvement Amendments CLIA as qualified to perform high complexity clinical laboratory testing.    MEROPENEM Value in next row Sensitive      <=4 SENSITIVEThis is a modified FDA-approved test that has been validated and its performance characteristics determined by the reporting laboratory.  This laboratory is certified under the Clinical Laboratory Improvement Amendments CLIA as qualified to perform high complexity clinical laboratory testing.    TRIMETH/SULFA Value in next row Sensitive      <=4 SENSITIVEThis is a modified FDA-approved test that has been validated and its performance characteristics determined by the reporting laboratory.  This laboratory is certified under the Clinical Laboratory Improvement Amendments CLIA as qualified to perform high complexity clinical laboratory testing.    AMPICILLIN/SULBACTAM Value in next row Sensitive      <=4 SENSITIVEThis is a modified  FDA-approved test that has been validated and its performance characteristics determined by the reporting laboratory.  This laboratory is certified under the Clinical Laboratory Improvement Amendments CLIA as qualified to perform high complexity clinical laboratory testing.    PIP/TAZO Value in next row Sensitive      <=4 SENSITIVEThis is a modified FDA-approved test that has been validated and its performance characteristics determined by the reporting laboratory.  This laboratory is certified under the Clinical Laboratory Improvement Amendments CLIA as qualified to perform high complexity clinical laboratory testing.    * KLEBSIELLA PNEUMONIAE  Blood Culture ID Panel (Reflexed)     Status: Abnormal   Collection Time: 11/15/23  4:55 PM  Result Value Ref Range Status   Enterococcus faecalis NOT DETECTED NOT DETECTED Final   Enterococcus Faecium NOT DETECTED NOT DETECTED Final   Listeria monocytogenes NOT DETECTED NOT DETECTED Final   Staphylococcus species NOT DETECTED NOT DETECTED Final   Staphylococcus aureus (BCID) NOT DETECTED NOT DETECTED Final   Staphylococcus epidermidis NOT DETECTED NOT DETECTED Final   Staphylococcus lugdunensis NOT DETECTED NOT DETECTED Final   Streptococcus species NOT DETECTED NOT DETECTED Final   Streptococcus agalactiae NOT DETECTED NOT DETECTED Final   Streptococcus pneumoniae NOT DETECTED NOT DETECTED Final   Streptococcus pyogenes NOT DETECTED NOT DETECTED Final   A.calcoaceticus-baumannii NOT DETECTED NOT DETECTED Final   Bacteroides fragilis NOT DETECTED NOT DETECTED Final   Enterobacterales DETECTED (A) NOT DETECTED Final    Comment: CRITICAL RESULT CALLED TO,  READ BACK BY AND VERIFIED WITH: PHARMD B WANNARAT [id number] AT 1037 BY CM    Enterobacter cloacae complex NOT DETECTED NOT DETECTED Final   Escherichia coli NOT DETECTED NOT DETECTED Final   Klebsiella aerogenes NOT DETECTED NOT DETECTED Final   Klebsiella oxytoca NOT DETECTED NOT DETECTED Final    Klebsiella pneumoniae DETECTED (A) NOT DETECTED Final    Comment: CRITICAL RESULT CALLED TO, READ BACK BY AND VERIFIED WITH: PHARMD B WANNARAT [id number] AT 1037 BY CM    Proteus species NOT DETECTED NOT DETECTED Final   Salmonella species NOT DETECTED NOT DETECTED Final   Serratia marcescens NOT DETECTED NOT DETECTED Final   Haemophilus influenzae NOT DETECTED NOT DETECTED Final   Neisseria meningitidis NOT DETECTED NOT DETECTED Final   Pseudomonas aeruginosa NOT DETECTED NOT DETECTED Final   Stenotrophomonas maltophilia NOT DETECTED NOT DETECTED Final   Candida albicans NOT DETECTED NOT DETECTED Final   Candida auris NOT DETECTED NOT DETECTED Final   Candida glabrata NOT DETECTED NOT DETECTED Final   Candida krusei NOT DETECTED NOT DETECTED Final   Candida parapsilosis NOT DETECTED NOT DETECTED Final   Candida tropicalis NOT DETECTED NOT DETECTED Final   Cryptococcus neoformans/gattii NOT DETECTED NOT DETECTED Final   CTX-M ESBL DETECTED (A) NOT DETECTED Final    Comment: CRITICAL RESULT CALLED TO, READ BACK BY AND VERIFIED WITH: PHARMD B WANNARAT [id number] AT 1037 BY CM (NOTE) Extended spectrum beta-lactamase detected. Recommend a carbapenem as initial therapy.      Carbapenem resistance IMP NOT DETECTED NOT DETECTED Final   Carbapenem resistance KPC NOT DETECTED NOT DETECTED Final   Carbapenem resistance NDM NOT DETECTED NOT DETECTED Final   Carbapenem resist OXA 48 LIKE NOT DETECTED NOT DETECTED Final   Carbapenem resistance VIM NOT DETECTED NOT DETECTED Final    Comment: Performed at [facility], [address] [place], [place] [address]  Culture, blood (Routine X 2) w Reflex to ID Panel     Status: None (Preliminary result)   Collection Time: 11/18/23  9:32 AM   Specimen: BLOOD LEFT ARM  Result Value Ref Range Status   Specimen Description BLOOD LEFT ARM  Final   Special Requests   Final    BOTTLES DRAWN AEROBIC AND ANAEROBIC Blood Culture adequate volume   Culture    Final    NO GROWTH < 24 HOURS Performed at [facility], [address], [place], [place] [address]    Report Status PENDING  Incomplete  Culture, blood (Routine X 2) w Reflex to ID Panel     Status: None (Preliminary result)   Collection Time: 11/18/23  9:42 AM   Specimen: BLOOD RIGHT HAND  Result Value Ref Range Status   Specimen Description BLOOD RIGHT HAND  Final   Special Requests   Final    BOTTLES DRAWN AEROBIC AND ANAEROBIC Blood Culture adequate volume   Culture   Final    NO GROWTH < 24 HOURS Performed at [facility], [address], [place], [place] [address]    Report Status PENDING  Incomplete    Please note: You were cared for by a hospitalist during your hospital stay. Once you are discharged, your primary care physician will handle any further medical issues. Please note that NO REFILLS for any discharge medications will be authorized once you are discharged, as it is imperative that you return to your primary care physician (or establish a relationship with a primary care physician if you do  not have one) for your post hospital discharge needs so that they can reassess your need for medications and monitor your lab values.    Time coordinating discharge: 40 minutes  SIGNED:   Ivonne Mustache, MD  [facility] 11/19/2023, 10:38 AM Pager [phone]  If 7PM-7AM, please contact night-coverage [email or web address] Password [email or web address]

## 2023-11-19 NOTE — Progress Notes (Signed)
 DISCHARGE NOTE HOME SHANEEKA SCARBORO to be discharged Home per MD order. Discussed prescriptions and follow up appointments with the patient. Prescriptions given to patient; medication list explained in detail. Patient verbalized understanding.  Skin clean, dry and intact without evidence of skin break down, no evidence of skin tears noted. IV catheter discontinued intact. Site without signs and symptoms of complications. Dressing and pressure applied. Pt denies pain at the site currently. No complaints noted. Pt. Discharged with PICC in place  Patient free of lines, drains, and wounds.   An After Visit Summary (AVS) was printed and given to the patient. Patient escorted via wheelchair, and discharged home via private auto.  Doyal Sias, RN

## 2023-11-20 ENCOUNTER — Telehealth: Payer: Self-pay

## 2023-11-20 DIAGNOSIS — A419 Sepsis, unspecified organism: Secondary | ICD-10-CM | POA: Diagnosis not present

## 2023-11-20 DIAGNOSIS — N390 Urinary tract infection, site not specified: Secondary | ICD-10-CM | POA: Diagnosis not present

## 2023-11-20 NOTE — Telephone Encounter (Signed)
 Patient called, she has been receiving her IV ertapenem around 10/11 AM while in the hospital. She would like to change her administration time to 6 AM so that her husband can administer the medication before he leaves for work.   She will give her daily dose today at her usual time and switch to 6 AM tomorrow.   She also would like a different home health nurse and potentially a different agency. Advised she would need to contact her [facility] agency and/or [facility] and provided her with their contact info.   She is unable to come in on 10/21, appointment rescheduled for 10/24.   Makynzi Eastland, BSN, RN

## 2023-11-20 NOTE — Transitions of Care (Post Inpatient/ED Visit) (Signed)
 11/20/2023  Name: [name] MRN: [id number] DOB: 05-25-1960  Today's TOC FU Call Status: Today's TOC FU Call Status:: Successful TOC FU Call Completed TOC FU Call Complete Date: 11/20/23 Patient's Name and Date of Birth confirmed.  Transition Care Management Follow-up Telephone Call Date of Discharge: 11/19/23 Discharge Facility: [facility]) Type of Discharge: Inpatient Admission Primary Inpatient Discharge Diagnosis:: Hydronephrosis of right kidney How have you been since you were released from the hospital?: Better Any questions or concerns?: No  Items Reviewed: Did you receive and understand the discharge instructions provided?: Yes Dietary orders reviewed?: Yes Type of Diet Ordered:: low sodium heart healthy diet Do you have support at home?: Yes People in Home [RPT]: child(ren), adult, spouse  Medications Reviewed Today: Medications Reviewed Today     Reviewed by Rumalda Alan PENNER, RN (Registered Nurse) on 11/20/23 at 1121  Med List Status: <None>   Medication Order Taking? Sig Documenting Provider Last Dose Status Informant  albuterol  (VENTOLIN  HFA) 108 (90 Base) MCG/ACT inhaler [id number] Yes INHALE 2 PUFFS INTO THE LUNGS EVERY 6 HOURS AS NEEDED Norleen Lynwood ORN, MD  Active Self, Pharmacy Records  atorvastatin  (LIPITOR) 20 MG tablet [id number] Yes Take 1 tablet (20 mg total) by mouth daily. Norleen Lynwood ORN, MD  Active Self, Pharmacy Records  calcium  carbonate (TUMS - DOSED IN MG ELEMENTAL CALCIUM ) 500 MG chewable tablet [id number] Yes Chew 1 tablet by mouth daily as needed for indigestion or heartburn. [provider]  Active Self, Pharmacy Records  Carboxymethylcellul-Glycerin (CLEAR EYES FOR DRY EYES OP) [id number] Yes Place 1 drop into both eyes daily as needed (Dry eyes). [provider]  Active Self, Pharmacy Records  docusate sodium  (COLACE) 100 MG capsule [id number] Yes Take 1 capsule (100 mg total) by mouth daily as needed for up to 30 doses. Selma Donnice SAUNDERS, MD  Active Self, Pharmacy Records  ertapenem [facility]) IVPB [id number] Yes Inject 1 g into the vein daily for 13 days. Indication:  ESBL Klebsiella pneumoniae bacteremia First Dose: Yes Last Day of Therapy:  12/01/2023 Labs - Once weekly:  CBC/D and BMP, Labs - Once weekly: ESR and CRP Method of administration: Mini-Bag Plus / Gravity Method of administration may be changed at the discretion of home infusion pharmacist based upon assessment of the patient and/or caregiver's ability to self-administer the medication ordered. Jillian Buttery, MD  Active   famotidine  (PEPCID ) 20 MG tablet [id number] Yes Take 1 tablet (20 mg total) by mouth 2 (two) times daily. Norleen Lynwood ORN, MD  Active Self, Pharmacy Records  fluticasone  furoate-vilanterol (BREO ELLIPTA ) 200-25 MCG/ACT AEPB [id number]  Inhale 1 puff into the lungs daily.  Patient not taking: Reported on 11/20/2023   Norleen Lynwood ORN, MD  Active Self, Pharmacy Records  LORazepam  (ATIVAN ) 0.5 MG tablet [id number] Yes Take 1 tablet (0.5 mg total) by mouth 2 (two) times daily as needed for anxiety. Norleen Lynwood ORN, MD  Active Self, Pharmacy Records  losartan -hydrochlorothiazide  [facility]) 100-25 MG tablet [id number] Yes Take 1 tablet by mouth daily. Norleen Lynwood ORN, MD  Active Self, Pharmacy Records  metoprolol  succinate (TOPROL -XL) 25 MG 24 hr tablet [id number] Yes Take 1 tablet (25 mg total) by mouth daily. Terra Fairy PARAS, PA-C  Active Self, Pharmacy Records  ondansetron  (ZOFRAN -ODT) 4 MG disintegrating tablet [id number] Yes TAKE 1 TABLET BY MOUTH EVERY 8 HOURS AS NEEDED FOR NAUSEA AND VOMITING Webb, Padonda B, FNP  Active Self, Pharmacy Records  oxyCODONE -acetaminophen  (PERCOCET/ROXICET) 5-325 MG tablet [id number] Yes Take 1  tablet by mouth every 4 (four) hours as needed for up to 18 doses. Selma Donnice SAUNDERS, MD  Active Self, Pharmacy Records  potassium chloride  (KLOR-CON  10) 10 MEQ tablet [id number] Yes Take 1 tablet (10 mEq total) by mouth daily. Norleen Lynwood ORN, MD  Active Self, Pharmacy Records           Med Note EVERETTE, JESUSA JAYSON Repress Nov 15, 2023  8:54 AM) Patient states she does not take daily. Her PCP states to take when she needs it - she determines when she needs it by urine being dark  rivaroxaban  (XARELTO ) 20 MG TABS tablet [id number] Yes Take 1 tablet (20 mg total) by mouth daily with supper. Terra Fairy PARAS, PA-C  Active Self, Pharmacy Records           Med Note (SATTERFIELD, TEENA FORBES Repress Oct 04, 2023  8:42 PM) Patient indeed verified she is taking this medication at 3 am  triamcinolone  cream (KENALOG ) 0.1 % [id number] Yes APPLY 1 APPLICATION TOPICALLY 2 (TWO) TIMES DAILY AS NEEDED (ITCHING). Norleen Lynwood ORN, MD  Active Self, Pharmacy Records            Home Care and Equipment/Supplies: Were Home Health Services Ordered?: Yes Name of Home Health Agency:: [facility] and [facility] home health nurse Has Agency set up a time to come to your home?: Yes First Home Health Visit Date: 11/20/23 Any new equipment or medical supplies ordered?: Yes Name of Medical supply agency?: at the hospital- rollator Were you able to get the equipment/medical supplies?: Yes Do you have any questions related to the use of the equipment/supplies?: No  Functional Questionnaire: Do you need assistance with bathing/showering or dressing?: No Do you need assistance with meal preparation?: No Do you need assistance with eating?: No Do you have difficulty maintaining continence: Yes (some accident) Do you need assistance with getting out of bed/getting out of a chair/moving?: Yes (rollator) Do you have difficulty managing or taking your medications?: No  Follow up appointments reviewed: PCP Follow-up appointment confirmed?: No (Will call today and make an appointment) MD Provider Line Number:[phone] Given: No Specialist Hospital Follow-up appointment confirmed?: Yes Date of Specialist follow-up appointment?: 12/01/23 Follow-Up Specialty  Provider:: ID Do you need transportation to your follow-up appointment?: No Do you understand care options if your condition(s) worsen?: Yes-patient verbalized understanding  SDOH Interventions Today    Flowsheet Row Most Recent Value  SDOH Interventions   Food Insecurity Interventions Intervention Not Indicated  Housing Interventions Intervention Not Indicated  Transportation Interventions Intervention Not Indicated  Utilities Interventions Intervention Not Indicated    Patient reports that she is doing well. Home health nurse from [facility] is coming today to start her IV antibiotic infusions.    Reviewed all medications. Reviewed activity  Reviewed ADLS and IADLs Reviewed and ensured transportation. Patient reports that she has food but not much.  Offered to make a Child psychotherapist referral and patient declined.   Encouraged patient to call [employer] to see if she has any food benefits. Patient refused to complete Anxiety screening.  Reviewed and offered 30 day TOC program and patient declined. Provided my contact information for patient to call me back if needed. Encouraged patient to make an appointment for follow up with PCP and patient did not feel it necessary. I reviewed the reason for importance.    Alan Ee, RN, BSN, CEN [employer]- Transition of Care Team.  [facility] [phone]

## 2023-11-21 DIAGNOSIS — N390 Urinary tract infection, site not specified: Secondary | ICD-10-CM | POA: Diagnosis not present

## 2023-11-21 DIAGNOSIS — A419 Sepsis, unspecified organism: Secondary | ICD-10-CM | POA: Diagnosis not present

## 2023-11-21 LAB — CULTURE, BLOOD (ROUTINE X 2)

## 2023-11-22 DIAGNOSIS — A419 Sepsis, unspecified organism: Secondary | ICD-10-CM | POA: Diagnosis not present

## 2023-11-22 DIAGNOSIS — N390 Urinary tract infection, site not specified: Secondary | ICD-10-CM | POA: Diagnosis not present

## 2023-11-23 ENCOUNTER — Telehealth: Payer: Self-pay

## 2023-11-23 DIAGNOSIS — A419 Sepsis, unspecified organism: Secondary | ICD-10-CM | POA: Diagnosis not present

## 2023-11-23 DIAGNOSIS — N390 Urinary tract infection, site not specified: Secondary | ICD-10-CM | POA: Diagnosis not present

## 2023-11-23 LAB — CULTURE, BLOOD (ROUTINE X 2)
Culture: NO GROWTH
Culture: NO GROWTH
Special Requests: ADEQUATE
Special Requests: ADEQUATE

## 2023-11-23 NOTE — Telephone Encounter (Signed)
 Copied from CRM [id number]. Topic: Clinical - Home Health Verbal Orders >> Nov 23, 2023 12:02 PM Mercedes MATSU wrote: Caller/Agency: [facility]) Callback Number: [phone] (secure line) Service Requested: Physical Therapy (for strengthen an safety)  Frequency: 1 week 1 to evaluate and treat Any new concerns about the patient? Yes, DID A PLAN OF CARE FOR SKILLED NURSING ON 11/20/2023. They followed with a frequency 1 week 4, and 1 month 1.  She is having IV infusions for a UTI, nursing is going to do labs and centralized dressing change weekly until she completes her antibiotics. Also requesting measurements of pick line, that was placed on 11/19/2023.

## 2023-11-23 NOTE — Telephone Encounter (Signed)
Ok for all verbals 

## 2023-11-24 DIAGNOSIS — N390 Urinary tract infection, site not specified: Secondary | ICD-10-CM | POA: Diagnosis not present

## 2023-11-24 DIAGNOSIS — A419 Sepsis, unspecified organism: Secondary | ICD-10-CM | POA: Diagnosis not present

## 2023-11-24 NOTE — Telephone Encounter (Signed)
 Called and gave verbals.

## 2023-11-25 DIAGNOSIS — N390 Urinary tract infection, site not specified: Secondary | ICD-10-CM | POA: Diagnosis not present

## 2023-11-25 DIAGNOSIS — A419 Sepsis, unspecified organism: Secondary | ICD-10-CM | POA: Diagnosis not present

## 2023-11-26 DIAGNOSIS — A419 Sepsis, unspecified organism: Secondary | ICD-10-CM | POA: Diagnosis not present

## 2023-11-26 DIAGNOSIS — N390 Urinary tract infection, site not specified: Secondary | ICD-10-CM | POA: Diagnosis not present

## 2023-11-27 DIAGNOSIS — N390 Urinary tract infection, site not specified: Secondary | ICD-10-CM | POA: Diagnosis not present

## 2023-11-27 DIAGNOSIS — A419 Sepsis, unspecified organism: Secondary | ICD-10-CM | POA: Diagnosis not present

## 2023-11-28 DIAGNOSIS — N390 Urinary tract infection, site not specified: Secondary | ICD-10-CM | POA: Diagnosis not present

## 2023-11-28 DIAGNOSIS — A419 Sepsis, unspecified organism: Secondary | ICD-10-CM | POA: Diagnosis not present

## 2023-11-29 ENCOUNTER — Encounter: Payer: Self-pay | Admitting: Infectious Diseases

## 2023-11-29 ENCOUNTER — Emergency Department (HOSPITAL_COMMUNITY)
Admission: EM | Admit: 2023-11-29 | Discharge: 2023-11-29 | Disposition: A | Discharge status: Home / Self Care | Attending: Emergency Medicine | Admitting: Emergency Medicine

## 2023-11-29 ENCOUNTER — Other Ambulatory Visit: Payer: Self-pay

## 2023-11-29 DIAGNOSIS — A419 Sepsis, unspecified organism: Secondary | ICD-10-CM | POA: Diagnosis not present

## 2023-11-29 DIAGNOSIS — Z95828 Presence of other vascular implants and grafts: Secondary | ICD-10-CM | POA: Insufficient documentation

## 2023-11-29 DIAGNOSIS — J45909 Unspecified asthma, uncomplicated: Secondary | ICD-10-CM | POA: Insufficient documentation

## 2023-11-29 DIAGNOSIS — I10 Essential (primary) hypertension: Secondary | ICD-10-CM | POA: Insufficient documentation

## 2023-11-29 DIAGNOSIS — N390 Urinary tract infection, site not specified: Secondary | ICD-10-CM | POA: Diagnosis not present

## 2023-11-29 LAB — CBC WITH DIFFERENTIAL/PLATELET
Abs Immature Granulocytes: 0.08 K/uL — ABNORMAL HIGH (ref 0.00–0.07)
Basophils Absolute: 0.1 K/uL (ref 0.0–0.1)
Basophils Relative: 1 %
Eosinophils Absolute: 0.3 K/uL (ref 0.0–0.5)
Eosinophils Relative: 2 %
HCT: 39.4 % (ref 36.0–46.0)
Hemoglobin: 12.3 g/dL (ref 12.0–15.0)
Immature Granulocytes: 1 %
Lymphocytes Relative: 15 %
Lymphs Abs: 2.2 K/uL (ref 0.7–4.0)
MCH: 30.5 pg (ref 26.0–34.0)
MCHC: 31.2 g/dL (ref 30.0–36.0)
MCV: 97.8 fL (ref 80.0–100.0)
Monocytes Absolute: 0.9 K/uL (ref 0.1–1.0)
Monocytes Relative: 6 %
Neutro Abs: 10.6 K/uL — ABNORMAL HIGH (ref 1.7–7.7)
Neutrophils Relative %: 75 %
Platelets: 332 K/uL (ref 150–400)
RBC: 4.03 MIL/uL (ref 3.87–5.11)
RDW: 13.6 % (ref 11.5–15.5)
WBC: 14.1 K/uL — ABNORMAL HIGH (ref 4.0–10.5)
nRBC: 0 % (ref 0.0–0.2)

## 2023-11-29 LAB — BASIC METABOLIC PANEL WITH GFR
Anion gap: 12 (ref 5–15)
BUN: 23 mg/dL (ref 8–23)
CO2: 23 mmol/L (ref 22–32)
Calcium: 8.9 mg/dL (ref 8.9–10.3)
Chloride: 102 mmol/L (ref 98–111)
Creatinine, Ser: 1.01 mg/dL — ABNORMAL HIGH (ref 0.44–1.00)
GFR, Estimated: >60 mL/min (ref >60)
Glucose, Bld: 101 mg/dL — ABNORMAL HIGH (ref 70–99)
Potassium: 4 mmol/L (ref 3.5–5.1)
Sodium: 137 mmol/L (ref 135–145)

## 2023-11-29 NOTE — ED Triage Notes (Signed)
 Pt to the ed from home with CC of air bubbles in picc line. Pt was sent home with continuous abx after a kidney stent placement. Pt has two days of iv abx left. Pt denies pain, swelling, redness to picc line site. Site is intact and working.  Pt is only concerned about air bubbles in line.

## 2023-11-29 NOTE — ED Provider Notes (Signed)
 [place] [facility] Provider Note   CSN: [id number] Arrival date & time: 11/29/23  9055     Patient presents with: Vascular Access Problem   [name] is a 63 y.o. female.   Pt is a 63 year old female with history of paroxysmal A-fib, asthma, diverticulosis, GERD, hyperlipidemia, hypertension, recent admission on 8/25 for right hydronephrosis due to nephroureterolithiasis requiring ureteral stent and complication with E. coli bacteremia with Ur cx results ESBL E. coli and ESBL Klebsiella on ertapenem currently with PICC line in place who is presenting today due to concern that there was some air bubbles in the line when her husband was infusing the antibiotic and she got concerned.  She has not had any significant pain around the PICC line, no swelling in her arm.  She has overall been feeling well.  She also reports she was supposed to come to the hospital tomorrow to get blood work done and is wondering since she is here if she can have it done today.  The history is provided by the patient.       Prior to Admission medications   Medication Sig Start Date End Date Taking? Authorizing Provider  albuterol  (VENTOLIN  HFA) 108 (90 Base) MCG/ACT inhaler INHALE 2 PUFFS INTO THE LUNGS EVERY 6 HOURS AS NEEDED 08/06/23   Norleen Lynwood ORN, MD  atorvastatin  (LIPITOR) 20 MG tablet Take 1 tablet (20 mg total) by mouth daily. 08/06/23   Norleen Lynwood ORN, MD  calcium  carbonate (TUMS - DOSED IN MG ELEMENTAL CALCIUM ) 500 MG chewable tablet Chew 1 tablet by mouth daily as needed for indigestion or heartburn.    [provider]  Carboxymethylcellul-Glycerin (CLEAR EYES FOR DRY EYES OP) Place 1 drop into both eyes daily as needed (Dry eyes).    [provider]  docusate sodium  (COLACE) 100 MG capsule Take 1 capsule (100 mg total) by mouth daily as needed for up to 30 doses. 10/30/23   Selma Donnice SAUNDERS, MD  ertapenem [facility]) IVPB Inject 1 g into the vein  daily for 13 days. Indication:  ESBL Klebsiella pneumoniae bacteremia First Dose: Yes Last Day of Therapy:  12/01/2023 Labs - Once weekly:  CBC/D and BMP, Labs - Once weekly: ESR and CRP Method of administration: Mini-Bag Plus / Gravity Method of administration may be changed at the discretion of home infusion pharmacist based upon assessment of the patient and/or caregiver's ability to self-administer the medication ordered. 11/19/23 12/02/23  Jillian Buttery, MD  famotidine  (PEPCID ) 20 MG tablet Take 1 tablet (20 mg total) by mouth 2 (two) times daily. 08/06/23   Norleen Lynwood ORN, MD  fluticasone  furoate-vilanterol (BREO ELLIPTA ) 200-25 MCG/ACT AEPB Inhale 1 puff into the lungs daily. Patient not taking: Reported on 11/20/2023 08/06/23   Norleen Lynwood ORN, MD  LORazepam  (ATIVAN ) 0.5 MG tablet Take 1 tablet (0.5 mg total) by mouth 2 (two) times daily as needed for anxiety. 08/06/23   Norleen Lynwood ORN, MD  losartan -hydrochlorothiazide  (HYZAAR) 100-25 MG tablet Take 1 tablet by mouth daily. 08/06/23   Norleen Lynwood ORN, MD  metoprolol  succinate (TOPROL -XL) 25 MG 24 hr tablet Take 1 tablet (25 mg total) by mouth daily. 12/19/22   Terra Fairy PARAS, PA-C  ondansetron  (ZOFRAN -ODT) 4 MG disintegrating tablet TAKE 1 TABLET BY MOUTH EVERY 8 HOURS AS NEEDED FOR NAUSEA AND VOMITING 07/24/23   Webb, Padonda B, FNP  oxyCODONE -acetaminophen  (PERCOCET/ROXICET) 5-325 MG tablet Take 1 tablet by mouth every 4 (four) hours as needed for  up to 18 doses. 10/30/23   Selma Donnice SAUNDERS, MD  potassium chloride  (KLOR-CON  10) 10 MEQ tablet Take 1 tablet (10 mEq total) by mouth daily. 08/06/23   Norleen Lynwood ORN, MD  rivaroxaban  (XARELTO ) 20 MG TABS tablet Take 1 tablet (20 mg total) by mouth daily with supper. 12/19/22   Terra Fairy PARAS, PA-C  triamcinolone  cream (KENALOG ) 0.1 % APPLY 1 APPLICATION TOPICALLY 2 (TWO) TIMES DAILY AS NEEDED (ITCHING). 08/31/23   Norleen Lynwood ORN, MD    Allergies: Morphine , Shrimp [shellfish allergy], Tramadol , Covid-19  (mrna) vaccine, Diltiazem  hcl, and Latex    Review of Systems  Updated Vital Signs BP 132/76   Pulse 81   Temp 97.7 F (36.5 C)   Resp 20   Ht 5' 2 (1.575 m)   Wt 127 kg   SpO2 94%   BMI 51.21 kg/m   Physical Exam Vitals and nursing note reviewed.  Constitutional:      General: She is not in acute distress. HENT:     Head: Normocephalic.  Cardiovascular:     Rate and Rhythm: Normal rate.     Pulses: Normal pulses.  Pulmonary:     Effort: Pulmonary effort is normal.  Musculoskeletal:     Comments: PICC line present in the right upper arm without any erythema swelling or pain around the site  Neurological:     Mental Status: She is alert.  Psychiatric:        Mood and Affect: Mood normal.     (all labs ordered are listed, but only abnormal results are displayed) Labs Reviewed  CBC WITH DIFFERENTIAL/PLATELET  BASIC METABOLIC PANEL WITH GFR    EKG: None  Radiology: No results found.   Procedures   Medications Ordered in the ED - No data to display                                  Medical Decision Making  Patient presenting today with multiple medical problems but concern for her PICC line.  PICC line site is well-appearing.  She has no signs of infection and the line flushes well.  Reassured the patient.     Final diagnoses:  S/P PICC central line placement    ED Discharge Orders     None          Doretha Folks, MD 11/29/23 1201

## 2023-11-29 NOTE — Progress Notes (Signed)
 Attempted to reach at patient's given call back number [phone], unable to connect/leave voice message.

## 2023-11-29 NOTE — Discharge Instructions (Signed)
 We sent a CBC and BMP today which is the only test that you have had ordered in the past that I can see.  You can tell them that on the phone and should not have to come tomorrow if those are the only 2 test you needed

## 2023-11-30 ENCOUNTER — Telehealth: Payer: Self-pay

## 2023-11-30 ENCOUNTER — Telehealth (HOSPITAL_COMMUNITY): Payer: Self-pay | Admitting: Pharmacy Technician

## 2023-11-30 ENCOUNTER — Other Ambulatory Visit (HOSPITAL_COMMUNITY): Payer: Self-pay | Admitting: Internal Medicine

## 2023-11-30 DIAGNOSIS — N390 Urinary tract infection, site not specified: Secondary | ICD-10-CM | POA: Diagnosis not present

## 2023-11-30 DIAGNOSIS — A498 Other bacterial infections of unspecified site: Secondary | ICD-10-CM | POA: Insufficient documentation

## 2023-11-30 DIAGNOSIS — A419 Sepsis, unspecified organism: Secondary | ICD-10-CM | POA: Diagnosis not present

## 2023-11-30 LAB — URINE CULTURE

## 2023-11-30 NOTE — Telephone Encounter (Signed)
 Auth Submission: NO AUTH NEEDED Site of care: [facility] Payer: [employer]  Medication & CPT/J Code(s) submitted: G8664 Erpatenum (Invanz) Diagnosis Code: A49.8, Z16.12  Route of submission (phone, fax, portal):  Phone # [phone] -> number for [facility] dept Fax # Auth type: Buy/Bill HB Units/visits requested: 1 GRAM X 1 DOSE Reference number: [id number] (spoke to [facility].) Approval from: 11/30/2023 to 02/10/24    Dagoberto Armour, CPhT [facility] Phone: [phone] 11/30/2023

## 2023-11-30 NOTE — Addendum Note (Signed)
 Addended by: Michaeleen Down G on: 11/30/2023 12:25 PM   Modules accepted: Orders

## 2023-11-30 NOTE — Telephone Encounter (Signed)
 Connected with patient and advised of new appointment for IV infusion/labs/picc line removal.  Patient verbalized understanding.  Enis Kleine, LPN

## 2023-11-30 NOTE — Telephone Encounter (Signed)
 Patient called requesting urine culture results from ED visit - patient stated that she is worried about results. Advised I would send a message to provider.   [name] [name], CMA

## 2023-12-01 ENCOUNTER — Ambulatory Visit: Admitting: Infectious Diseases

## 2023-12-01 ENCOUNTER — Ambulatory Visit (HOSPITAL_COMMUNITY)
Admission: RE | Admit: 2023-12-01 | Discharge: 2023-12-01 | Disposition: A | Discharge status: Home / Self Care | Source: Ambulatory Visit | Attending: Internal Medicine | Admitting: Internal Medicine

## 2023-12-01 VITALS — BP 121/77 | HR 82 | Temp 97.9°F | Resp 18

## 2023-12-01 DIAGNOSIS — Z1612 Extended spectrum beta lactamase (ESBL) resistance: Secondary | ICD-10-CM | POA: Diagnosis not present

## 2023-12-01 DIAGNOSIS — A419 Sepsis, unspecified organism: Secondary | ICD-10-CM | POA: Diagnosis not present

## 2023-12-01 DIAGNOSIS — A498 Other bacterial infections of unspecified site: Secondary | ICD-10-CM | POA: Insufficient documentation

## 2023-12-01 DIAGNOSIS — N390 Urinary tract infection, site not specified: Secondary | ICD-10-CM | POA: Diagnosis not present

## 2023-12-01 LAB — COMPREHENSIVE METABOLIC PANEL WITH GFR
ALT: 39 U/L (ref 0–44)
AST: 38 U/L (ref 15–41)
Albumin: 2.7 g/dL — ABNORMAL LOW (ref 3.5–5.0)
Alkaline Phosphatase: 99 U/L (ref 38–126)
Anion gap: 8 (ref 5–15)
BUN: 32 mg/dL — ABNORMAL HIGH (ref 8–23)
CO2: 25 mmol/L (ref 22–32)
Calcium: 9 mg/dL (ref 8.9–10.3)
Chloride: 106 mmol/L (ref 98–111)
Creatinine, Ser: 1.34 mg/dL — ABNORMAL HIGH (ref 0.44–1.00)
GFR, Estimated: 45 mL/min — ABNORMAL LOW (ref >60)
Glucose, Bld: 106 mg/dL — ABNORMAL HIGH (ref 70–99)
Potassium: 3.9 mmol/L (ref 3.5–5.1)
Sodium: 139 mmol/L (ref 135–145)
Total Bilirubin: 0.7 mg/dL (ref 0.0–1.2)
Total Protein: 7.3 g/dL (ref 6.5–8.1)

## 2023-12-01 LAB — CBC WITH DIFFERENTIAL/PLATELET
Abs Immature Granulocytes: 0.06 K/uL (ref 0.00–0.07)
Basophils Absolute: 0.1 K/uL (ref 0.0–0.1)
Basophils Relative: 1 %
Eosinophils Absolute: 0.3 K/uL (ref 0.0–0.5)
Eosinophils Relative: 3 %
HCT: 40.3 % (ref 36.0–46.0)
Hemoglobin: 12.7 g/dL (ref 12.0–15.0)
Immature Granulocytes: 0 %
Lymphocytes Relative: 15 %
Lymphs Abs: 2.1 K/uL (ref 0.7–4.0)
MCH: 30.2 pg (ref 26.0–34.0)
MCHC: 31.5 g/dL (ref 30.0–36.0)
MCV: 96 fL (ref 80.0–100.0)
Monocytes Absolute: 1 K/uL (ref 0.1–1.0)
Monocytes Relative: 7 %
Neutro Abs: 10 K/uL — ABNORMAL HIGH (ref 1.7–7.7)
Neutrophils Relative %: 74 %
Platelets: 340 K/uL (ref 150–400)
RBC: 4.2 MIL/uL (ref 3.87–5.11)
RDW: 13.5 % (ref 11.5–15.5)
WBC: 13.5 K/uL — ABNORMAL HIGH (ref 4.0–10.5)
nRBC: 0 % (ref 0.0–0.2)

## 2023-12-01 LAB — C-REACTIVE PROTEIN: CRP: 1.5 mg/dL — ABNORMAL HIGH (ref <1.0)

## 2023-12-01 MED ORDER — SODIUM CHLORIDE 0.9 % IV SOLN
1.0000 g | Freq: Once | INTRAVENOUS | Status: AC
  Administered 2023-12-01: 1 g via INTRAVENOUS
  Filled 2023-12-01: qty 1000

## 2023-12-01 NOTE — Progress Notes (Signed)
 Spoke with IV team and pt ok for DC.  Dressing marked with old drainage and did not exceed beyond already marked borders.  Made pt aware that she should keep her dressing from where her PICC was removed on for 24 hours and to monitor for any signs and symptoms of infection for a week, including fevers, drainage, redness or warmth at the site.  Pt verbalized understanding.

## 2023-12-03 ENCOUNTER — Encounter (HOSPITAL_COMMUNITY): Admission: RE | Admit: 2023-12-03 | Source: Ambulatory Visit

## 2023-12-04 ENCOUNTER — Other Ambulatory Visit: Payer: Self-pay

## 2023-12-04 ENCOUNTER — Encounter: Payer: Self-pay | Admitting: Infectious Diseases

## 2023-12-04 ENCOUNTER — Ambulatory Visit (INDEPENDENT_AMBULATORY_CARE_PROVIDER_SITE_OTHER): Admitting: Infectious Diseases

## 2023-12-04 VITALS — BP 143/78 | HR 71 | Temp 97.5°F | Ht 62.0 in | Wt 283.0 lb

## 2023-12-04 DIAGNOSIS — N200 Calculus of kidney: Secondary | ICD-10-CM | POA: Diagnosis not present

## 2023-12-04 DIAGNOSIS — N138 Other obstructive and reflux uropathy: Secondary | ICD-10-CM

## 2023-12-04 DIAGNOSIS — Z8619 Personal history of other infectious and parasitic diseases: Secondary | ICD-10-CM

## 2023-12-04 NOTE — Progress Notes (Signed)
 Patient: [name]  DOB: 1960-09-14 MRN: [id number] PCP: Norleen Lynwood ORN, MD   Reason for Visit: ESBL infection, pyelonephritis, follow up from hospital   Chief Complaint  Patient presents with   Follow-up      Subjective   Subjective:   Chief Complaint  Patient presents with   Follow-up    Assessment and Plan    ESBL-producing urinary tract infection due to E. coli and Klebsiella pneumoniae - Secondary bacteremia -  Post-hospitalization for ESBL-producing UTI with E. coli and Klebsiella pneumoniae. Completed course of ertapenem. White blood cell count trending down from 25,500 to 13,500 over two weeks, indicating improvement. She does feel improved but still with pain from stone in place.   Antibiotic suppression therapy for prevention of recurrent UTI - Ciprofloxacin  prescribed for suppression therapy to prevent recurrent UTI until stone removal. Current dose is 500 mg twice daily. Discussed common side effects include and CDif precautions discussed. Informed her about the potential side effects and advised to monitor for them. I was going to do Bactrim 1 DS daily for prophylaxis to reduce CDiff risk, but she would like to use what she paid for which is understandable.  - Continue ciprofloxacin  500 mg twice daily until October 30th, then reduce to 500 mg once daily until procedure (tentatively scheduled 12/05 but she is calling to get it removed sooner).  - Monitor for side effects such as diarrhea, abdominal pain, fevers - she will call me if these develop.  Bilateral nephrolithiasis with planned intervention - Bilateral nephrolithiasis with planned cystoscopy and ureteroscopy with stent placement on December 5th. Previous surgery in September did not remove all stones. Current pain and urinary obstruction due to stones. - Proceed with planned cystoscopy and ureteroscopy with stent placement on December 5th. - Encourage her to continue advocating for earlier  intervention if possible.       Review of Systems  Constitutional:  Negative for chills and fever.  HENT:  Negative for tinnitus.   Eyes:  Negative for blurred vision and photophobia.  Respiratory:  Negative for cough and sputum production.   Cardiovascular:  Negative for chest pain.  Gastrointestinal:  Negative for diarrhea, nausea and vomiting.  Genitourinary:  Negative for dysuria.  Skin:  Negative for rash.  Neurological:  Negative for headaches.    Past Medical History:  Diagnosis Date   Abscess of bladder 07/28/2016   Alcohol dependence (HCC)    Allergic rhinitis 12/06/2013   Anxiety    Asthma 03/16/2015   Atrial flutter with rapid ventricular response (HCC) 04/01/2020   COLONIC POLYPS, HX OF 04/05/2010   COVID-19 09/2023   DIVERTICULITIS, HX OF 04/05/2010   DJD (degenerative joint disease)    right knee, mot to severe   Dysrhythmia    Fatty liver    GERD (gastroesophageal reflux disease)    no meds   Heart murmur    hx of    Hemorrhoids    History of kidney stones    Hyperlipidemia    Hypertension    Impaired glucose tolerance 12/06/2013   Morbid obesity with BMI of 50.0-59.9, adult (HCC)    Nausea & vomiting 05/24/2022   Peripheral vascular disease    Pulmonary embolism (HCC)     Outpatient Medications Prior to Visit  Medication Sig Dispense Refill   albuterol  (VENTOLIN  HFA) 108 (90 Base) MCG/ACT inhaler INHALE 2 PUFFS INTO THE LUNGS EVERY 6 HOURS AS NEEDED 18 each 5   atorvastatin  (LIPITOR) 20 MG tablet  Take 1 tablet (20 mg total) by mouth daily. 90 tablet 3   calcium  carbonate (TUMS - DOSED IN MG ELEMENTAL CALCIUM ) 500 MG chewable tablet Chew 1 tablet by mouth daily as needed for indigestion or heartburn.     Carboxymethylcellul-Glycerin (CLEAR EYES FOR DRY EYES OP) Place 1 drop into both eyes daily as needed (Dry eyes).     docusate sodium  (COLACE) 100 MG capsule Take 1 capsule (100 mg total) by mouth daily as needed for up to 30 doses. 30 capsule 0    famotidine  (PEPCID ) 20 MG tablet Take 1 tablet (20 mg total) by mouth 2 (two) times daily. 180 tablet 3   LORazepam  (ATIVAN ) 0.5 MG tablet Take 1 tablet (0.5 mg total) by mouth 2 (two) times daily as needed for anxiety. 30 tablet 2   losartan -hydrochlorothiazide  (HYZAAR) 100-25 MG tablet Take 1 tablet by mouth daily. 30 tablet 8   metoprolol  succinate (TOPROL -XL) 25 MG 24 hr tablet Take 1 tablet (25 mg total) by mouth daily. 30 tablet 11   ondansetron  (ZOFRAN -ODT) 4 MG disintegrating tablet TAKE 1 TABLET BY MOUTH EVERY 8 HOURS AS NEEDED FOR NAUSEA AND VOMITING 18 tablet 3   oxyCODONE -acetaminophen  (PERCOCET/ROXICET) 5-325 MG tablet Take 1 tablet by mouth every 4 (four) hours as needed for up to 18 doses. 18 tablet 0   potassium chloride  (KLOR-CON  10) 10 MEQ tablet Take 1 tablet (10 mEq total) by mouth daily. 90 tablet 3   rivaroxaban  (XARELTO ) 20 MG TABS tablet Take 1 tablet (20 mg total) by mouth daily with supper. 30 tablet 6   triamcinolone  cream (KENALOG ) 0.1 % APPLY 1 APPLICATION TOPICALLY 2 (TWO) TIMES DAILY AS NEEDED (ITCHING). 30 g 2   fluticasone  furoate-vilanterol (BREO ELLIPTA ) 200-25 MCG/ACT AEPB Inhale 1 puff into the lungs daily. (Patient not taking: Reported on 12/04/2023) 3 each 3   No facility-administered medications prior to visit.     Allergies  Allergen Reactions   Morphine  Itching   Shrimp [Shellfish Allergy] Itching and Other (See Comments)    Tongue burns also   Tramadol  Other (See Comments)    Caused confusion   Covid-19 (Mrna) Vaccine Hives   Diltiazem  Hcl Itching    Pt with itching of the feet when bolus given   Latex Rash    Social History   Tobacco Use   Smoking status: Former    Current packs/day: 0.00    Average packs/day: 0.5 packs/day for 7.0 years (3.5 ttl pk-yrs)    Types: Cigarettes    Start date: 02/11/1976    Quit date: 02/11/1983    Years since quitting: 40.8    Passive exposure: Never   Smokeless tobacco: Never  Vaping Use   Vaping status:  Never Used  Substance Use Topics   Alcohol use: Yes    Alcohol/week: 1.0 standard drink of alcohol    Types: 1 Shots of liquor per week    Comment: daily half pint   Drug use: Not Currently    Types: Marijuana    Comment: smoked marijuana x 10 years.  Quit in 1985.    Family History  Problem Relation Age of Onset   Stroke Mother    COPD Father    Lymphoma Sister    Alcoholism Sister        Objective   Objective:   Vitals:   12/04/23 0856  BP: (!) 143/78  Pulse: 71  Temp: (!) 97.5 F (36.4 C)  TempSrc: Oral  SpO2: 97%  Weight: 283 lb (  128.4 kg)  Height: 5' 2 (1.575 m)   Body mass index is 51.76 kg/m.  Physical Exam Constitutional:      Appearance: Normal appearance. She is not ill-appearing.  HENT:     Mouth/Throat:     Mouth: Mucous membranes are moist.     Pharynx: Oropharynx is clear.  Eyes:     General: No scleral icterus. Cardiovascular:     Rate and Rhythm: Normal rate.  Pulmonary:     Effort: Pulmonary effort is normal.  Neurological:     Mental Status: She is oriented to person, place, and time.  Psychiatric:        Mood and Affect: Mood normal.        Thought Content: Thought content normal.        Assessment & Plan:     ESBL-producing urinary tract infection due to E. coli and Klebsiella pneumoniae - Secondary bacteremia -  Post-hospitalization for ESBL-producing UTI with E. coli and Klebsiella pneumoniae. Completed course of ertapenem. White blood cell count trending down from 25,500 to 13,500 over two weeks, indicating improvement. She does feel improved but still with pain from stone in place.   Antibiotic suppression therapy for prevention of recurrent UTI - Ciprofloxacin  prescribed for suppression therapy to prevent recurrent UTI until stone removal. Current dose is 500 mg twice daily. Discussed common side effects include and CDif precautions discussed. Informed her about the potential side effects and advised to monitor for them. I  was going to do Bactrim 1 DS daily for prophylaxis to reduce CDiff risk, but she would like to use what she paid for which is understandable.  - Continue ciprofloxacin  500 mg twice daily until October 30th, then reduce to 500 mg once daily until procedure (tentatively scheduled 12/05 but she is calling to get it removed sooner).  - Monitor for side effects such as diarrhea, abdominal pain, fevers - she will call me if these develop.  Bilateral nephrolithiasis with planned intervention - Bilateral nephrolithiasis with planned cystoscopy and ureteroscopy with stent placement on December 5th. Previous surgery in September did not remove all stones. Current pain and urinary obstruction due to stones. - Proceed with planned cystoscopy and ureteroscopy with stent placement on December 5th. - Encourage her to continue advocating for earlier intervention if possible.        No orders of the defined types were placed in this encounter.   No orders of the defined types were placed in this encounter.   Return if symptoms worsen or fail to improve.   Corean Fireman, MSN, NP-C [facility]  [email or web address] Pager: [phone] Office: [phone] [facility] Main Line: [phone] *Secure Chat Communication Welcome

## 2023-12-04 NOTE — Patient Instructions (Addendum)
  Valli Shank at [facility]   Continue the Ciprofloxacin  500 mg tablet - TWICE a day until October 30th,   THEN   Decrease the Cipro  to one tablet once a day until your procedure.   Can come back here as needed.

## 2023-12-09 ENCOUNTER — Ambulatory Visit: Admitting: Internal Medicine

## 2023-12-09 ENCOUNTER — Telehealth: Payer: Self-pay

## 2023-12-09 MED ORDER — CIPROFLOXACIN HCL 250 MG PO TABS: 250.0000 mg | ORAL_TABLET | Freq: Two times a day (BID) | ORAL | 0 refills | Status: DC

## 2023-12-09 NOTE — Telephone Encounter (Signed)
 Patient called stating that she will be out of ciprofloxacin  tomorrow -- asked for a refill of medication. Uses [facility] on [address]. Patient stated that she was unable to change surgery date. Patient asked to speak with provider. Informed her I would relay message.    Shiheem Corporan SHAUNNA Letters, CMA

## 2023-12-09 NOTE — Telephone Encounter (Signed)
 Will send in a refill of the Ciprofloxacin  at a lower dose to continue until surgery on 12/05.   I spoke with urology team and he said they were going to try to push it earlier from last conversation but sounded like it was not an easy task for the timing of end of year.   The new instructions will be to take the newer lower dose tablet of cipro  twice a day until her procedure.   THE DAY OF the procedure, she should take a higher dose of 2 capsules x 4 doses then can stop.   Thank you

## 2023-12-09 NOTE — Addendum Note (Signed)
 Addended by: MELVENIA COREAN SAILOR on: 12/09/2023 03:17 PM   Modules accepted: Orders

## 2023-12-10 ENCOUNTER — Encounter (HOSPITAL_COMMUNITY): Payer: Self-pay | Admitting: Internal Medicine

## 2023-12-11 ENCOUNTER — Ambulatory Visit (INDEPENDENT_AMBULATORY_CARE_PROVIDER_SITE_OTHER): Payer: PRIVATE HEALTH INSURANCE | Admitting: Podiatry

## 2023-12-11 ENCOUNTER — Encounter (HOSPITAL_COMMUNITY): Payer: Self-pay | Admitting: Internal Medicine

## 2023-12-11 ENCOUNTER — Encounter: Payer: Self-pay | Admitting: Podiatry

## 2023-12-11 DIAGNOSIS — B351 Tinea unguium: Secondary | ICD-10-CM

## 2023-12-11 DIAGNOSIS — M79609 Pain in unspecified limb: Secondary | ICD-10-CM

## 2023-12-11 DIAGNOSIS — D689 Coagulation defect, unspecified: Secondary | ICD-10-CM

## 2023-12-11 NOTE — Progress Notes (Signed)
 This patient returns to my office for at risk foot care.  This patient requires this care by a professional since this patient will be at risk due to having CKD and coagulation defect due to xarelto . This patient is unable to cut nails herself since the patient cannot reach her nails.These nails are painful walking and wearing shoes.  This patient presents for at risk foot care today.  General Appearance  Alert, conversant and in no acute stress.  Vascular  Dorsalis pedis and posterior tibial  pulses are palpable  bilaterally.  Capillary return is within normal limits  bilaterally. Temperature is within normal limits  bilaterally.  Neurologic  Senn-Weinstein monofilament wire test within normal limits  bilaterally. Muscle power within normal limits bilaterally.  Nails Thick disfigured discolored nails with subungual debris  from hallux to fifth toes bilaterally. No evidence of bacterial infection or drainage bilaterally.  Orthopedic  No limitations of motion  feet .  No crepitus or effusions noted.  No bony pathology or digital deformities noted.  Skin  normotropic skin with no porokeratosis noted bilaterally.  No signs of infections or ulcers noted.     Onychomycosis  Pain in right toes  Pain in left toes  Consent was obtained for treatment procedures.   Mechanical debridement of nails 1-5  bilaterally performed with a nail nipper.  Filed with dremel without incident.    Return office visit     3 months                 Told patient to return for periodic foot care and evaluation due to potential at risk complications.   Cordella Bold DPM

## 2023-12-21 ENCOUNTER — Ambulatory Visit (HOSPITAL_COMMUNITY): Admit: 2023-12-21 | Admitting: Urology

## 2023-12-21 SURGERY — CYSTOSCOPY/URETEROSCOPY/HOLMIUM LASER/STENT PLACEMENT
Anesthesia: General | Laterality: Bilateral

## 2023-12-29 DIAGNOSIS — N201 Calculus of ureter: Secondary | ICD-10-CM | POA: Diagnosis not present

## 2024-01-01 NOTE — Patient Instructions (Signed)
 SURGICAL WAITING ROOM VISITATION  Patients having surgery or a procedure may have no more than 2 support people in the waiting area - these visitors may rotate.    Children under the age of 47 must have an adult with them who is not the patient.  Visitors with respiratory illnesses are discouraged from visiting and should remain at home.  If the patient needs to stay at the hospital during part of their recovery, the visitor guidelines for inpatient rooms apply. Pre-op nurse will coordinate an appropriate time for 1 support person to accompany patient in pre-op.  This support person may not rotate.    Please refer to the [facility] website for the visitor guidelines for Inpatients (after your surgery is over and you are in a regular room).    Your procedure is scheduled on: 01/15/24   Report to [facility] Main Entrance    Report to admitting at 5:15 AM   Call this number if you have problems the morning of surgery [phone]   Do not eat food or drink liquids :After Midnight.          If you have questions, please contact your surgeon's office.   FOLLOW BOWEL PREP AND ANY ADDITIONAL PRE OP INSTRUCTIONS YOU RECEIVED FROM YOUR SURGEON'S OFFICE!!!     Oral Hygiene is also important to reduce your risk of infection.                                    Remember - BRUSH YOUR TEETH THE MORNING OF SURGERY WITH YOUR REGULAR TOOTHPASTE  DENTURES WILL BE REMOVED PRIOR TO SURGERY PLEASE DO NOT APPLY Poly grip OR ADHESIVES!!!   Stop all vitamins and herbal supplements 7 days before surgery.   Take these medicines the morning of surgery with A SIP OF WATER: Inhalers, Atorvastatin , Ciprofloxacin , Famotidine , Lorazepam , Metoprolol , Zofran , Oxybutynin                                You may not have any metal on your body including hair pins, jewelry, and body piercing             Do not wear make-up, lotions, powders, perfumes, or deodorant  Do not wear nail polish including gel  and S&S, artificial/acrylic nails, or any other type of covering on natural nails including finger and toenails. If you have artificial nails, gel coating, etc. that needs to be removed by a nail salon please have this removed prior to surgery or surgery may need to be canceled/ delayed if the surgeon/ anesthesia feels like they are unable to be safely monitored.   Do not shave  48 hours prior to surgery.    Do not bring valuables to the hospital. [place] IS NOT             RESPONSIBLE   FOR VALUABLES.   Contacts, glasses, dentures or bridgework may not be worn into surgery.  DO NOT BRING YOUR HOME MEDICATIONS TO THE HOSPITAL. PHARMACY WILL DISPENSE MEDICATIONS LISTED ON YOUR MEDICATION LIST TO YOU DURING YOUR ADMISSION IN THE HOSPITAL!    Patients discharged on the day of surgery will not be allowed to drive home.  Someone NEEDS to stay with you for the first 24 hours after anesthesia.              Please read  over the following fact sheets you were given: IF YOU HAVE QUESTIONS ABOUT YOUR PRE-OP INSTRUCTIONS PLEASE CALL [phone] [place].   If you received a COVID test during your pre-op visit  it is requested that you wear a mask when out in public, stay away from anyone that may not be feeling well and notify your surgeon if you develop symptoms. If you test positive for Covid or have been in contact with anyone that has tested positive in the last 10 days please notify you surgeon.    [facility] - Preparing for Surgery Before surgery, you can play an important role.  Because skin is not sterile, your skin needs to be as free of germs as possible.  You can reduce the number of germs on your skin by washing with CHG (chlorahexidine gluconate) soap before surgery.  CHG is an antiseptic cleaner which kills germs and bonds with the skin to continue killing germs even after washing. Please DO NOT use if you have an allergy to CHG or antibacterial soaps.  If your skin becomes  reddened/irritated stop using the CHG and inform your nurse when you arrive at Short Stay. Do not shave (including legs and underarms) for at least 48 hours prior to the first CHG shower.  You may shave your face/neck.  Please follow these instructions carefully:  1.  Shower with CHG Soap the night before surgery and the morning of surgery.  2.  If you choose to wash your hair, wash your hair first as usual with your normal  shampoo.  3.  After you shampoo, rinse your hair and body thoroughly to remove the shampoo.                             4.  Use CHG as you would any other liquid soap.  You can apply chg directly to the skin and wash.  Gently with a scrungie or clean washcloth.  5.  Apply the CHG Soap to your body ONLY FROM THE NECK DOWN.   Do   not use on face/ open                           Wound or open sores. Avoid contact with eyes, ears mouth and   genitals (private parts).                       Wash face,  Genitals (private parts) with your normal soap.             6.  Wash thoroughly, paying special attention to the area where your    surgery  will be performed.  7.  Thoroughly rinse your body with warm water from the neck down.  8.  DO NOT shower/wash with your normal soap after using and rinsing off the CHG Soap.                9.  Pat yourself dry with a clean towel.            10.  Wear clean pajamas.            11.  Place clean sheets on your bed the night of your first shower and do not  sleep with pets. Day of Surgery : Do not apply any CHG, lotions/deodorants the morning of surgery.  Please wear clean clothes to the hospital/surgery center.  FAILURE  TO FOLLOW THESE INSTRUCTIONS MAY RESULT IN THE CANCELLATION OF YOUR SURGERY  PATIENT SIGNATURE_________________________________  NURSE SIGNATURE__________________________________  ________________________________________________________________________

## 2024-01-01 NOTE — Progress Notes (Signed)
 Date of COVID positive in last 90 days:  PCP - Lynwood Rush, MD Cardiologist - Fairy Heinrich, MD Pulmonologist- Ozell America, MD  Chest x-ray - 04/29/23 Epic EKG - 11/15/23 Epic Stress Test - N/A ECHO - 04/03/20 Epic Cardiac Cath - N/A Pacemaker/ICD device last checked:N/A Spinal Cord Stimulator:N/A  Bowel Prep - N/A  Sleep Study - N/A CPAP -   Fasting Blood Sugar - pre? Checks Blood Sugar _____ times a day  Last dose of GLP1 agonist-  N/A GLP1 instructions:  Do not take after     Last dose of SGLT-2 inhibitors-  N/A SGLT-2 instructions:  Do not take after     Blood Thinner Instructions: Xarelto ,  Aspirin  Instructions:N/A Last Dose:  Activity level:  Can go up a flight of stairs and perform activities of daily living without stopping and without symptoms of chest pain or shortness of breath.  Able to exercise without symptoms  Unable to go up a flight of stairs without symptoms of     Anesthesia review: HTN, PE, a fib, asthma, CKD, fatty liver, heart murmur, ETOH  Patient denies shortness of breath, fever, cough and chest pain at PAT appointment  Patient verbalized understanding of instructions that were given to them at the PAT appointment. Patient was also instructed that they will need to review over the PAT instructions again at home before surgery.

## 2024-01-04 ENCOUNTER — Encounter (HOSPITAL_COMMUNITY): Payer: Self-pay

## 2024-01-04 ENCOUNTER — Other Ambulatory Visit: Payer: Self-pay

## 2024-01-04 ENCOUNTER — Encounter (HOSPITAL_COMMUNITY)
Admission: RE | Admit: 2024-01-04 | Discharge: 2024-01-04 | Disposition: A | Discharge status: Home / Self Care | Source: Ambulatory Visit | Attending: Urology | Admitting: Urology

## 2024-01-08 ENCOUNTER — Encounter (HOSPITAL_COMMUNITY)
Admission: RE | Admit: 2024-01-08 | Discharge: 2024-01-08 | Disposition: A | Discharge status: Home / Self Care | Source: Ambulatory Visit | Attending: Urology | Admitting: Urology

## 2024-01-08 DIAGNOSIS — Z01818 Encounter for other preprocedural examination: Secondary | ICD-10-CM | POA: Diagnosis not present

## 2024-01-08 DIAGNOSIS — Z01812 Encounter for preprocedural laboratory examination: Secondary | ICD-10-CM | POA: Diagnosis not present

## 2024-01-08 LAB — BASIC METABOLIC PANEL WITH GFR
Anion gap: 11 (ref 5–15)
BUN: 38 mg/dL — ABNORMAL HIGH (ref 8–23)
CO2: 25 mmol/L (ref 22–32)
Calcium: 8.6 mg/dL — ABNORMAL LOW (ref 8.9–10.3)
Chloride: 105 mmol/L (ref 98–111)
Creatinine, Ser: 1.61 mg/dL — ABNORMAL HIGH (ref 0.44–1.00)
GFR, Estimated: 36 mL/min — ABNORMAL LOW (ref >60)
Glucose, Bld: 123 mg/dL — ABNORMAL HIGH (ref 70–99)
Potassium: 3.8 mmol/L (ref 3.5–5.1)
Sodium: 141 mmol/L (ref 135–145)

## 2024-01-08 LAB — CBC
HCT: 38.5 % (ref 36.0–46.0)
Hemoglobin: 12 g/dL (ref 12.0–15.0)
MCH: 29.7 pg (ref 26.0–34.0)
MCHC: 31.2 g/dL (ref 30.0–36.0)
MCV: 95.3 fL (ref 80.0–100.0)
Platelets: 252 K/uL (ref 150–400)
RBC: 4.04 MIL/uL (ref 3.87–5.11)
RDW: 13.7 % (ref 11.5–15.5)
WBC: 10.2 K/uL (ref 4.0–10.5)
nRBC: 0 % (ref 0.0–0.2)

## 2024-01-08 NOTE — Progress Notes (Signed)
 Creatinine 1.61, results routed to Dr. Selma

## 2024-01-11 NOTE — Anesthesia Preprocedure Evaluation (Signed)
 Anesthesia Evaluation    Airway        Dental   Pulmonary former smoker          Cardiovascular hypertension,      Neuro/Psych    GI/Hepatic   Endo/Other    Renal/GU      Musculoskeletal   Abdominal   Peds  Hematology   Anesthesia Other Findings   Reproductive/Obstetrics                              Anesthesia Physical Anesthesia Plan  ASA:   Anesthesia Plan:    Post-op Pain Management:    Induction:   PONV Risk Score and Plan:   Airway Management Planned:   Additional Equipment:   Intra-op Plan:   Post-operative Plan:   Informed Consent:   Plan Discussed with:   Anesthesia Plan Comments: (See PAT note from 11/24 )         Anesthesia Quick Evaluation

## 2024-01-11 NOTE — Progress Notes (Signed)
 Case: [id number] Date/Time: 01/15/24 0715   Procedure: CYSTOSCOPY/URETEROSCOPY/HOLMIUM LASER/STENT PLACEMENT (Bilateral)   Anesthesia type: General   Diagnosis: Ureteral stone [N20.1]   Pre-op diagnosis: BILATERAL URETERAL STONES   Location: [facility]   Surgeons: Selma Donnice SAUNDERS, MD       DISCUSSION: [name] is a 63 yo female with PMH of former smoking, HTN, A flutter on Xarelto , hx of PE, asthma, GERD, hx of ETOH abuse, obesity (BMI 52)  Admitted from 8/24-8/28/2025 with sepsis secondary to UTI, underwent cystoscopy with stent placement.   OR for cystoscopy, ureteroscopy, lithotripsy and stent exchange on 9/19. No complications noted.  Admitted again from 10/5-10/9 for urosepsis from another obstructing stone. Went to the OR 10/5 for bilateral stent placement. Patient discharged with PICC line and ID follow up.  Seen by ID on 10/24. She finished a course of ertapenem  via PICC line. Ciprofloxacin  prescribed for suppression therapy to prevent recurrent UTI until stone removal.   Pt follows with a-fib clinic, last seen 12/19/2022. At this visit pt asymptomatic. Per notes paroxysmal a-fib likely in setting of alcohol abuse, continues on metoprolol  and Xarelto . 1 year follow up recommended at this visit. Per a-fib clinic pt can hold Xarelto  2 days prior to procedure. No recent recurrent a-fib. Pt denies chest pain or shortness of breath, reports she can climb a flight of stairs without sx.   At PAT visit pt reports she can do a flight of stairs without CP/SOB  LD Xarelto : 12/1   VS:  Wt Readings from Last 3 Encounters:  01/08/24 126.1 kg  12/04/23 128.4 kg  11/29/23 127 kg   Temp Readings from Last 3 Encounters:  01/08/24 36.7 C (Oral)  12/04/23 (!) 36.4 C (Oral)  12/01/23 36.6 C (Oral)   BP Readings from Last 3 Encounters:  01/08/24 128/79  12/04/23 (!) 143/78  12/01/23 121/77   Pulse Readings from Last 3 Encounters:  01/08/24 83  12/04/23 71   12/01/23 82     PROVIDERS: Norleen Lynwood ORN, MD Cardiologist - Fairy Heinrich, MD Pulmonologist- Ozell America, MD  LABS: Labs reviewed: Acceptable for surgery. (all labs ordered are listed, but only abnormal results are displayed)  Labs Reviewed - No data to display   Echo 04/03/2020:  IMPRESSIONS    1. Left ventricular ejection fraction, by estimation, is 50 to 55%. The left ventricle has low normal function. The left ventricle has no regional wall motion abnormalities. There is mild concentric left ventricular hypertrophy. Left ventricular diastolic parameters are indeterminate.  2. Right ventricular systolic function is normal. The right ventricular size is normal. Tricuspid regurgitation signal is inadequate for assessing PA pressure.  3. Left atrial size was mildly dilated.  4. Right atrial size was mildly dilated.  5. A small pericardial effusion is present. The pericardial effusion is circumferential.  6. The mitral valve is normal in structure. No evidence of mitral valve regurgitation. No evidence of mitral stenosis.  7. The aortic valve is grossly normal. Aortic valve regurgitation is not visualized. No aortic stenosis is present.  8. The inferior vena cava is normal in size with <50% respiratory variability, suggesting right atrial pressure of 8 mmHg.  Comparison(s): Changes from prior study are noted. EF now low normal.  Conclusion(s)/Recommendation(s): Otherwise normal echocardiogram, with minor abnormalities described in the report.  Past Medical History:  Diagnosis Date   Abscess of bladder 07/28/2016   Alcohol dependence (HCC)    Allergic rhinitis 12/06/2013   Anxiety  Asthma 03/16/2015   Atrial flutter with rapid ventricular response (HCC) 04/01/2020   COLONIC POLYPS, HX OF 04/05/2010   COVID-19 09/2023   DIVERTICULITIS, HX OF 04/05/2010   DJD (degenerative joint disease)    right knee, mot to severe   Dysrhythmia    Fatty liver    GERD  (gastroesophageal reflux disease)    no meds   Heart murmur    hx of    Hemorrhoids    History of kidney stones    Hyperlipidemia    Hypertension    Impaired glucose tolerance 12/06/2013   Morbid obesity with BMI of 50.0-59.9, adult (HCC)    Nausea & vomiting 05/24/2022   Peripheral vascular disease    Pulmonary embolism (HCC)     Past Surgical History:  Procedure Laterality Date   ABDOMINAL HYSTERECTOMY  age 61   fibroids   BREAST BIOPSY Left    COLONOSCOPY WITH PROPOFOL  N/A 07/25/2016   Procedure: COLONOSCOPY WITH PROPOFOL ;  Surgeon: Rollin Dover, MD;  [facility];  Service: Endoscopy;  Laterality: N/A;   colonscopy     x 2   CYSTOSCOPY W/ RETROGRADES Right 10/30/2023   Procedure: CYSTOSCOPY, WITH RETROGRADE PYELOGRAM;  Surgeon: Selma Donnice SAUNDERS, MD;  [facility];  Service: Urology;  Laterality: Right;   CYSTOSCOPY W/ URETERAL STENT PLACEMENT Right 09/05/2021   Procedure: CYSTOSCOPY WITH RETROGRADE PYELOGRAM/URETERAL STENT PLACEMENT;  Surgeon: Selma Donnice SAUNDERS, MD;  [facility];  Service: Urology;  Laterality: Right;   CYSTOSCOPY W/ URETERAL STENT PLACEMENT Right 10/04/2023   Procedure: CYSTOSCOPY, WITH RETROGRADE PYELOGRAM AND URETERAL STENT INSERTION;  Surgeon: Shane Steffan BROCKS, MD;  Location: [facility] OR;  Service: Urology;  Laterality: Right;   CYSTOSCOPY W/ URETERAL STENT PLACEMENT Right 11/15/2023   Procedure: CYSTOSCOPY, WITH RETROGRADE PYELOGRAM AND URETERAL STENT INSERTION;  Surgeon: Selma Donnice SAUNDERS, MD;  Location: [facility] OR;  Service: Urology;  Laterality: Right;   CYSTOSCOPY WITH RETROGRADE PYELOGRAM, URETEROSCOPY AND STENT PLACEMENT Left 08/14/2022   Procedure: CYSTOSCOPY WITH RETROGRADE PYELOGRAM;  Surgeon: Selma Donnice SAUNDERS, MD;  Location: [facility] OR;  Service: Urology;  Laterality: Left;   CYSTOSCOPY WITH URETEROSCOPY AND STENT PLACEMENT Left 08/14/2022   Procedure: URETEROSCOPY AND STENT PLACEMENT;  Surgeon: Selma Donnice SAUNDERS, MD;  Location: [facility] OR;  Service: Urology;   Laterality: Left;   CYSTOSCOPY/URETEROSCOPY/HOLMIUM LASER/STENT PLACEMENT Right 09/30/2021   Procedure: CYSTOSCOPY/ RETROGRADE/URETEROSCOPY/HOLMIUM LASER/STENT PLACEMENT;  Surgeon: Selma Donnice SAUNDERS, MD;  [facility];  Service: Urology;  Laterality: Right;   CYSTOSCOPY/URETEROSCOPY/HOLMIUM LASER/STENT PLACEMENT Left 09/12/2022   Procedure: CYSTOSCOPY/LEFT RETROGRADE PYELOGRAM/LEFT URETEROSCOPY/HOLMIUM LASER/LEFT STENT EXCHANGE;  Surgeon: Selma Donnice SAUNDERS, MD;  [facility];  Service: Urology;  Laterality: Left;  75 MINUTES NEEDED FOR CASE   CYSTOSCOPY/URETEROSCOPY/HOLMIUM LASER/STENT PLACEMENT Right 10/30/2023   Procedure: CYSTOSCOPY/URETEROSCOPY/HOLMIUM LASER/STENT PLACEMENT;  Surgeon: Selma Donnice SAUNDERS, MD;  [facility];  Service: Urology;  Laterality: Right;   IR RADIOLOGIST EVAL & MGMT  08/12/2016   IR RADIOLOGIST EVAL & MGMT  08/21/2016   KNEE ARTHROSCOPY     left    TOTAL KNEE ARTHROPLASTY  07/29/2011   Procedure: TOTAL KNEE ARTHROPLASTY;  Surgeon: Donnice JONETTA Car, MD;  [facility];  Service: Orthopedics;  Laterality: Right;   TOTAL KNEE ARTHROPLASTY Left 12/14/2012   Procedure: LEFT TOTAL KNEE ARTHROPLASTY;  Surgeon: Donnice JONETTA Car, MD;  [facility];  Service: Orthopedics;  Laterality: Left;    MEDICATIONS:  albuterol  (VENTOLIN  HFA) 108 (90 Base) MCG/ACT inhaler   atorvastatin  (LIPITOR) 20 MG  tablet   calcium  carbonate (TUMS - DOSED IN MG ELEMENTAL CALCIUM ) 500 MG chewable tablet   Carboxymethylcellul-Glycerin (CLEAR EYES FOR DRY EYES OP)   ciprofloxacin  (CIPRO ) 250 MG tablet   docusate sodium  (COLACE) 100 MG capsule   famotidine  (PEPCID ) 20 MG tablet   fluticasone  furoate-vilanterol (BREO ELLIPTA ) 200-25 MCG/ACT AEPB   LORazepam  (ATIVAN ) 0.5 MG tablet   losartan -hydrochlorothiazide  (HYZAAR) 100-25 MG tablet   metoprolol  succinate (TOPROL -XL) 25 MG 24 hr tablet   ondansetron  (ZOFRAN -ODT) 4 MG disintegrating tablet   oxybutynin  (DITROPAN ) 5 MG tablet    oxyCODONE -acetaminophen  (PERCOCET/ROXICET) 5-325 MG tablet   potassium chloride  (KLOR-CON  10) 10 MEQ tablet   rivaroxaban  (XARELTO ) 20 MG TABS tablet   triamcinolone  cream (KENALOG ) 0.1 %   No current facility-administered medications for this encounter.   Burnard CHRISTELLA Odis DEVONNA [facility] Short Stay/Anesthesiology [facility] Phone [phone] 01/11/2024 1:33 PM

## 2024-01-12 ENCOUNTER — Other Ambulatory Visit (HOSPITAL_COMMUNITY): Payer: Self-pay | Admitting: Internal Medicine

## 2024-01-13 ENCOUNTER — Other Ambulatory Visit: Payer: Self-pay

## 2024-01-13 ENCOUNTER — Emergency Department (HOSPITAL_COMMUNITY)

## 2024-01-13 ENCOUNTER — Inpatient Hospital Stay (HOSPITAL_COMMUNITY)
Admission: EM | Admit: 2024-01-13 | Discharge: 2024-01-18 | DRG: 854 | Disposition: A | Attending: Family Medicine | Admitting: Family Medicine

## 2024-01-13 DIAGNOSIS — N390 Urinary tract infection, site not specified: Secondary | ICD-10-CM | POA: Diagnosis not present

## 2024-01-13 DIAGNOSIS — R002 Palpitations: Secondary | ICD-10-CM | POA: Diagnosis not present

## 2024-01-13 DIAGNOSIS — I4821 Permanent atrial fibrillation: Secondary | ICD-10-CM | POA: Diagnosis not present

## 2024-01-13 DIAGNOSIS — E785 Hyperlipidemia, unspecified: Secondary | ICD-10-CM | POA: Diagnosis not present

## 2024-01-13 DIAGNOSIS — I472 Ventricular tachycardia, unspecified: Secondary | ICD-10-CM | POA: Diagnosis not present

## 2024-01-13 DIAGNOSIS — E1122 Type 2 diabetes mellitus with diabetic chronic kidney disease: Secondary | ICD-10-CM | POA: Diagnosis not present

## 2024-01-13 DIAGNOSIS — N132 Hydronephrosis with renal and ureteral calculous obstruction: Secondary | ICD-10-CM | POA: Diagnosis not present

## 2024-01-13 DIAGNOSIS — I130 Hypertensive heart and chronic kidney disease with heart failure and stage 1 through stage 4 chronic kidney disease, or unspecified chronic kidney disease: Secondary | ICD-10-CM | POA: Diagnosis not present

## 2024-01-13 DIAGNOSIS — Z6841 Body Mass Index (BMI) 40.0 and over, adult: Secondary | ICD-10-CM

## 2024-01-13 DIAGNOSIS — N136 Pyonephrosis: Secondary | ICD-10-CM | POA: Diagnosis not present

## 2024-01-13 DIAGNOSIS — I10 Essential (primary) hypertension: Secondary | ICD-10-CM | POA: Diagnosis not present

## 2024-01-13 DIAGNOSIS — K760 Fatty (change of) liver, not elsewhere classified: Secondary | ICD-10-CM | POA: Insufficient documentation

## 2024-01-13 DIAGNOSIS — N202 Calculus of kidney with calculus of ureter: Secondary | ICD-10-CM | POA: Diagnosis not present

## 2024-01-13 DIAGNOSIS — G4733 Obstructive sleep apnea (adult) (pediatric): Secondary | ICD-10-CM | POA: Diagnosis not present

## 2024-01-13 DIAGNOSIS — E66813 Obesity, class 3: Secondary | ICD-10-CM | POA: Diagnosis not present

## 2024-01-13 DIAGNOSIS — I4891 Unspecified atrial fibrillation: Secondary | ICD-10-CM | POA: Diagnosis not present

## 2024-01-13 DIAGNOSIS — A4150 Gram-negative sepsis, unspecified: Secondary | ICD-10-CM

## 2024-01-13 DIAGNOSIS — E1151 Type 2 diabetes mellitus with diabetic peripheral angiopathy without gangrene: Secondary | ICD-10-CM | POA: Diagnosis not present

## 2024-01-13 DIAGNOSIS — I129 Hypertensive chronic kidney disease with stage 1 through stage 4 chronic kidney disease, or unspecified chronic kidney disease: Secondary | ICD-10-CM | POA: Diagnosis not present

## 2024-01-13 DIAGNOSIS — I509 Heart failure, unspecified: Secondary | ICD-10-CM | POA: Diagnosis not present

## 2024-01-13 DIAGNOSIS — I517 Cardiomegaly: Secondary | ICD-10-CM | POA: Diagnosis not present

## 2024-01-13 DIAGNOSIS — I471 Supraventricular tachycardia, unspecified: Secondary | ICD-10-CM | POA: Diagnosis not present

## 2024-01-13 DIAGNOSIS — E872 Acidosis, unspecified: Secondary | ICD-10-CM | POA: Diagnosis not present

## 2024-01-13 DIAGNOSIS — R7989 Other specified abnormal findings of blood chemistry: Secondary | ICD-10-CM | POA: Diagnosis not present

## 2024-01-13 DIAGNOSIS — N200 Calculus of kidney: Secondary | ICD-10-CM | POA: Diagnosis not present

## 2024-01-13 DIAGNOSIS — K5730 Diverticulosis of large intestine without perforation or abscess without bleeding: Secondary | ICD-10-CM | POA: Diagnosis not present

## 2024-01-13 DIAGNOSIS — N179 Acute kidney failure, unspecified: Secondary | ICD-10-CM | POA: Diagnosis not present

## 2024-01-13 DIAGNOSIS — Z8616 Personal history of COVID-19: Secondary | ICD-10-CM | POA: Diagnosis not present

## 2024-01-13 DIAGNOSIS — I700 Atherosclerosis of aorta: Secondary | ICD-10-CM | POA: Insufficient documentation

## 2024-01-13 DIAGNOSIS — I480 Paroxysmal atrial fibrillation: Secondary | ICD-10-CM | POA: Diagnosis not present

## 2024-01-13 DIAGNOSIS — Z7901 Long term (current) use of anticoagulants: Secondary | ICD-10-CM | POA: Diagnosis not present

## 2024-01-13 DIAGNOSIS — N12 Tubulo-interstitial nephritis, not specified as acute or chronic: Secondary | ICD-10-CM | POA: Diagnosis not present

## 2024-01-13 DIAGNOSIS — N1831 Chronic kidney disease, stage 3a: Secondary | ICD-10-CM | POA: Diagnosis not present

## 2024-01-13 DIAGNOSIS — N10 Acute tubulo-interstitial nephritis: Secondary | ICD-10-CM | POA: Diagnosis not present

## 2024-01-13 DIAGNOSIS — R0989 Other specified symptoms and signs involving the circulatory and respiratory systems: Secondary | ICD-10-CM | POA: Diagnosis not present

## 2024-01-13 DIAGNOSIS — I2489 Other forms of acute ischemic heart disease: Secondary | ICD-10-CM | POA: Diagnosis not present

## 2024-01-13 DIAGNOSIS — R000 Tachycardia, unspecified: Secondary | ICD-10-CM | POA: Diagnosis not present

## 2024-01-13 DIAGNOSIS — I4892 Unspecified atrial flutter: Secondary | ICD-10-CM | POA: Diagnosis not present

## 2024-01-13 DIAGNOSIS — J45909 Unspecified asthma, uncomplicated: Secondary | ICD-10-CM | POA: Diagnosis not present

## 2024-01-13 DIAGNOSIS — N183 Chronic kidney disease, stage 3 unspecified: Secondary | ICD-10-CM | POA: Insufficient documentation

## 2024-01-13 LAB — URINALYSIS, ROUTINE W REFLEX MICROSCOPIC
Bilirubin Urine: NEGATIVE
Glucose, UA: NEGATIVE mg/dL
Ketones, ur: NEGATIVE mg/dL
Nitrite: NEGATIVE
Protein, ur: 100 mg/dL — AB
RBC / HPF: >50 RBC/hpf (ref 0–5)
Specific Gravity, Urine: 1.015 (ref 1.005–1.030)
WBC, UA: >50 WBC/hpf (ref 0–5)
pH: 5 (ref 5.0–8.0)

## 2024-01-13 LAB — CBC
HCT: 39.3 % (ref 36.0–46.0)
Hemoglobin: 12.6 g/dL (ref 12.0–15.0)
MCH: 29.8 pg (ref 26.0–34.0)
MCHC: 32.1 g/dL (ref 30.0–36.0)
MCV: 92.9 fL (ref 80.0–100.0)
Platelets: 231 K/uL (ref 150–400)
RBC: 4.23 MIL/uL (ref 3.87–5.11)
RDW: 14.3 % (ref 11.5–15.5)
WBC: 10.6 K/uL — ABNORMAL HIGH (ref 4.0–10.5)
nRBC: 0.3 % — ABNORMAL HIGH (ref 0.0–0.2)

## 2024-01-13 LAB — BASIC METABOLIC PANEL WITH GFR
Anion gap: 11 (ref 5–15)
BUN: 32 mg/dL — ABNORMAL HIGH (ref 8–23)
CO2: 24 mmol/L (ref 22–32)
Calcium: 8.6 mg/dL — ABNORMAL LOW (ref 8.9–10.3)
Chloride: 104 mmol/L (ref 98–111)
Creatinine, Ser: 1.56 mg/dL — ABNORMAL HIGH (ref 0.44–1.00)
GFR, Estimated: 37 mL/min — ABNORMAL LOW (ref >60)
Glucose, Bld: 118 mg/dL — ABNORMAL HIGH (ref 70–99)
Potassium: 3.8 mmol/L (ref 3.5–5.1)
Sodium: 139 mmol/L (ref 135–145)

## 2024-01-13 LAB — I-STAT CG4 LACTIC ACID, ED
Lactic Acid, Venous: 2.1 mmol/L (ref 0.5–1.9)
Lactic Acid, Venous: 1.9 mmol/L (ref 0.5–1.9)

## 2024-01-13 LAB — MAGNESIUM: Magnesium: 1.5 mg/dL — ABNORMAL LOW (ref 1.7–2.4)

## 2024-01-13 LAB — TROPONIN I (HIGH SENSITIVITY)
Troponin I (High Sensitivity): 18 ng/L — ABNORMAL HIGH (ref <18)
Troponin I (High Sensitivity): 18 ng/L — ABNORMAL HIGH (ref <18)

## 2024-01-13 MED ORDER — LORAZEPAM 1 MG PO TABS
0.5000 mg | ORAL_TABLET | Freq: Once | ORAL | Status: AC
  Administered 2024-01-13: 0.5 mg via ORAL
  Filled 2024-01-13: qty 1

## 2024-01-13 MED ORDER — METOPROLOL TARTRATE 25 MG PO TABS
25.0000 mg | ORAL_TABLET | Freq: Once | ORAL | Status: AC
  Administered 2024-01-13: 25 mg via ORAL
  Filled 2024-01-13: qty 1

## 2024-01-13 MED ORDER — TRAZODONE HCL 50 MG PO TABS
25.0000 mg | ORAL_TABLET | Freq: Every evening | ORAL | Status: DC | PRN
  Administered 2024-01-14 – 2024-01-17 (×5): 25 mg via ORAL
  Filled 2024-01-13 (×5): qty 1

## 2024-01-13 MED ORDER — LOSARTAN POTASSIUM-HCTZ 100-25 MG PO TABS: 1.0000 | ORAL_TABLET | Freq: Every day | ORAL | Status: DC

## 2024-01-13 MED ORDER — METOPROLOL SUCCINATE ER 25 MG PO TB24
25.0000 mg | ORAL_TABLET | Freq: Every day | ORAL | Status: DC
  Administered 2024-01-14 – 2024-01-15 (×2): 25 mg via ORAL
  Filled 2024-01-13 (×2): qty 1

## 2024-01-13 MED ORDER — SODIUM CHLORIDE 0.9 % IV SOLN
1.0000 g | Freq: Once | INTRAVENOUS | Status: AC
  Administered 2024-01-13: 1 g via INTRAVENOUS
  Filled 2024-01-13: qty 10

## 2024-01-13 MED ORDER — MAGNESIUM SULFATE 2 GM/50ML IV SOLN
2.0000 g | Freq: Once | INTRAVENOUS | Status: AC
  Administered 2024-01-13: 2 g via INTRAVENOUS
  Filled 2024-01-13: qty 50

## 2024-01-13 MED ORDER — ACETAMINOPHEN 650 MG RE SUPP: 650.0000 mg | Freq: Four times a day (QID) | RECTAL | Status: DC | PRN

## 2024-01-13 MED ORDER — FAMOTIDINE 20 MG PO TABS
20.0000 mg | ORAL_TABLET | Freq: Two times a day (BID) | ORAL | Status: DC
  Administered 2024-01-14 – 2024-01-18 (×10): 20 mg via ORAL
  Filled 2024-01-13 (×10): qty 1

## 2024-01-13 MED ORDER — RIVAROXABAN 20 MG PO TABS: 20.0000 mg | ORAL_TABLET | Freq: Every day | ORAL | Status: DC

## 2024-01-13 MED ORDER — SODIUM CHLORIDE 0.9 % IV SOLN: 2.0000 g | INTRAVENOUS | Status: DC

## 2024-01-13 MED ORDER — LOSARTAN POTASSIUM 50 MG PO TABS
100.0000 mg | ORAL_TABLET | Freq: Every day | ORAL | Status: DC
  Administered 2024-01-14: 100 mg via ORAL
  Filled 2024-01-13: qty 2

## 2024-01-13 MED ORDER — IOHEXOL 350 MG/ML SOLN
75.0000 mL | Freq: Once | INTRAVENOUS | Status: AC | PRN
  Administered 2024-01-13: 75 mL via INTRAVENOUS

## 2024-01-13 MED ORDER — DOCUSATE SODIUM 100 MG PO CAPS: 100.0000 mg | ORAL_CAPSULE | Freq: Every day | ORAL | Status: DC | PRN

## 2024-01-13 MED ORDER — OXYBUTYNIN CHLORIDE 5 MG PO TABS
5.0000 mg | ORAL_TABLET | Freq: Three times a day (TID) | ORAL | Status: DC | PRN
  Administered 2024-01-16: 5 mg via ORAL
  Filled 2024-01-13 (×2): qty 1

## 2024-01-13 MED ORDER — CALCIUM CARBONATE ANTACID 500 MG PO CHEW: 1.0000 | CHEWABLE_TABLET | Freq: Every day | ORAL | Status: DC | PRN

## 2024-01-13 MED ORDER — MAGNESIUM HYDROXIDE 400 MG/5ML PO SUSP: 30.0000 mL | Freq: Every day | ORAL | Status: DC | PRN

## 2024-01-13 MED ORDER — ACETAMINOPHEN 325 MG PO TABS: 650.0000 mg | ORAL_TABLET | Freq: Four times a day (QID) | ORAL | Status: DC | PRN

## 2024-01-13 MED ORDER — LACTATED RINGERS IV SOLN
150.0000 mL/h | INTRAVENOUS | Status: AC
  Administered 2024-01-14 (×2): 150 mL/h via INTRAVENOUS

## 2024-01-13 MED ORDER — LORAZEPAM 0.5 MG PO TABS
0.5000 mg | ORAL_TABLET | Freq: Two times a day (BID) | ORAL | Status: DC | PRN
  Administered 2024-01-14 – 2024-01-15 (×4): 0.5 mg via ORAL
  Filled 2024-01-13 (×4): qty 1

## 2024-01-13 MED ORDER — ONDANSETRON HCL 4 MG/2ML IJ SOLN
4.0000 mg | Freq: Four times a day (QID) | INTRAMUSCULAR | Status: DC | PRN
  Administered 2024-01-14 – 2024-01-18 (×4): 4 mg via INTRAVENOUS
  Filled 2024-01-13 (×3): qty 2

## 2024-01-13 MED ORDER — KETOROLAC TROMETHAMINE 15 MG/ML IJ SOLN
15.0000 mg | Freq: Once | INTRAMUSCULAR | Status: AC
  Administered 2024-01-13: 15 mg via INTRAVENOUS
  Filled 2024-01-13: qty 1

## 2024-01-13 MED ORDER — LIDOCAINE 5 % EX PTCH
1.0000 | MEDICATED_PATCH | Freq: Once | CUTANEOUS | Status: DC
  Filled 2024-01-13: qty 1

## 2024-01-13 MED ORDER — ONDANSETRON HCL 4 MG PO TABS: 4.0000 mg | ORAL_TABLET | Freq: Four times a day (QID) | ORAL | Status: DC | PRN

## 2024-01-13 MED ORDER — LACTATED RINGERS IV BOLUS
1000.0000 mL | Freq: Once | INTRAVENOUS | Status: AC
  Administered 2024-01-13: 1000 mL via INTRAVENOUS

## 2024-01-13 MED ORDER — OXYCODONE-ACETAMINOPHEN 5-325 MG PO TABS
1.0000 | ORAL_TABLET | Freq: Once | ORAL | Status: AC
  Administered 2024-01-13: 1 via ORAL
  Filled 2024-01-13: qty 1

## 2024-01-13 MED ORDER — HYDROCHLOROTHIAZIDE 25 MG PO TABS
25.0000 mg | ORAL_TABLET | Freq: Every day | ORAL | Status: DC
  Administered 2024-01-14: 25 mg via ORAL
  Filled 2024-01-13: qty 1

## 2024-01-13 MED ORDER — ATORVASTATIN CALCIUM 20 MG PO TABS
20.0000 mg | ORAL_TABLET | Freq: Every day | ORAL | Status: DC
  Administered 2024-01-14 – 2024-01-15 (×2): 20 mg via ORAL
  Filled 2024-01-13: qty 2
  Filled 2024-01-13: qty 1

## 2024-01-13 NOTE — Consult Note (Signed)
 Urology Consult   Physician requesting consult: Dr. Caron Salt  Reason for consult: Pyelonephritis, bilateral indwelling stents  History of Present Illness: [name] is a 63 y.o. with a history of urolithiasis.  She had undergone bilateral ureteral stent placement by Dr. Adina Siad in October for an obstructing right ureteral stone and known nonobstructing left renal calculi.  She was scheduled for definitive surgery on 01/15/2024 but presented tonight feeling poorly and having increased dysuria.  CT imaging was performed which demonstrated properly placed bilateral ureteral stents.  Her urinalysis was consistent with infection and she was noted to be tachycardic.  She is to be admitted by the internal medicine service and urology is consulted for assistance.  Past Medical History:  Diagnosis Date   Abscess of bladder 07/28/2016   Alcohol dependence (HCC)    Allergic rhinitis 12/06/2013   Anxiety    Asthma 03/16/2015   Atrial flutter with rapid ventricular response (HCC) 04/01/2020   COLONIC POLYPS, HX OF 04/05/2010   COVID-19 09/2023   DIVERTICULITIS, HX OF 04/05/2010   DJD (degenerative joint disease)    right knee, mot to severe   Dysrhythmia    Fatty liver    GERD (gastroesophageal reflux disease)    no meds   Heart murmur    hx of    Hemorrhoids    History of kidney stones    Hyperlipidemia    Hypertension    Impaired glucose tolerance 12/06/2013   Morbid obesity with BMI of 50.0-59.9, adult (HCC)    Nausea & vomiting 05/24/2022   Peripheral vascular disease    Pulmonary embolism (HCC)     Past Surgical History:  Procedure Laterality Date   ABDOMINAL HYSTERECTOMY  age 20   fibroids   BREAST BIOPSY Left    COLONOSCOPY WITH PROPOFOL  N/A 07/25/2016   Procedure: COLONOSCOPY WITH PROPOFOL ;  Surgeon: Rollin Dover, MD;  Location: [facility];  Service: Endoscopy;  Laterality: N/A;   colonscopy     x 2   CYSTOSCOPY W/ RETROGRADES Right 10/30/2023   Procedure:  CYSTOSCOPY, WITH RETROGRADE PYELOGRAM;  Surgeon: Siad Donnice SAUNDERS, MD;  Location: [facility];  Service: Urology;  Laterality: Right;   CYSTOSCOPY W/ URETERAL STENT PLACEMENT Right 09/05/2021   Procedure: CYSTOSCOPY WITH RETROGRADE PYELOGRAM/URETERAL STENT PLACEMENT;  Surgeon: Siad Donnice SAUNDERS, MD;  Location: [facility];  Service: Urology;  Laterality: Right;   CYSTOSCOPY W/ URETERAL STENT PLACEMENT Right 10/04/2023   Procedure: CYSTOSCOPY, WITH RETROGRADE PYELOGRAM AND URETERAL STENT INSERTION;  Surgeon: Shane Steffan BROCKS, MD;  Location: [facility] OR;  Service: Urology;  Laterality: Right;   CYSTOSCOPY W/ URETERAL STENT PLACEMENT Right 11/15/2023   Procedure: CYSTOSCOPY, WITH RETROGRADE PYELOGRAM AND URETERAL STENT INSERTION;  Surgeon: Siad Donnice SAUNDERS, MD;  Location: [facility] OR;  Service: Urology;  Laterality: Right;   CYSTOSCOPY WITH RETROGRADE PYELOGRAM, URETEROSCOPY AND STENT PLACEMENT Left 08/14/2022   Procedure: CYSTOSCOPY WITH RETROGRADE PYELOGRAM;  Surgeon: Siad Donnice SAUNDERS, MD;  Location: [facility] OR;  Service: Urology;  Laterality: Left;   CYSTOSCOPY WITH URETEROSCOPY AND STENT PLACEMENT Left 08/14/2022   Procedure: URETEROSCOPY AND STENT PLACEMENT;  Surgeon: Siad Donnice SAUNDERS, MD;  Location: [facility] OR;  Service: Urology;  Laterality: Left;   CYSTOSCOPY/URETEROSCOPY/HOLMIUM LASER/STENT PLACEMENT Right 09/30/2021   Procedure: CYSTOSCOPY/ RETROGRADE/URETEROSCOPY/HOLMIUM LASER/STENT PLACEMENT;  Surgeon: Siad Donnice SAUNDERS, MD;  Location: [facility];  Service: Urology;  Laterality: Right;   CYSTOSCOPY/URETEROSCOPY/HOLMIUM LASER/STENT PLACEMENT Left 09/12/2022   Procedure: CYSTOSCOPY/LEFT RETROGRADE PYELOGRAM/LEFT URETEROSCOPY/HOLMIUM LASER/LEFT STENT EXCHANGE;  Surgeon: Siad Donnice SAUNDERS, MD;  Location: [facility];  Service: Urology;  Laterality: Left;  75 MINUTES NEEDED FOR CASE   CYSTOSCOPY/URETEROSCOPY/HOLMIUM LASER/STENT PLACEMENT Right 10/30/2023   Procedure: CYSTOSCOPY/URETEROSCOPY/HOLMIUM LASER/STENT PLACEMENT;  Surgeon: Selma Donnice SAUNDERS, MD;   Location: [facility];  Service: Urology;  Laterality: Right;   IR RADIOLOGIST EVAL & MGMT  08/12/2016   IR RADIOLOGIST EVAL & MGMT  08/21/2016   KNEE ARTHROSCOPY     left    TOTAL KNEE ARTHROPLASTY  07/29/2011   Procedure: TOTAL KNEE ARTHROPLASTY;  Surgeon: Donnice JONETTA Car, MD;  Location: [facility];  Service: Orthopedics;  Laterality: Right;   TOTAL KNEE ARTHROPLASTY Left 12/14/2012   Procedure: LEFT TOTAL KNEE ARTHROPLASTY;  Surgeon: Donnice JONETTA Car, MD;  Location: [facility];  Service: Orthopedics;  Laterality: Left;     Current Hospital Medications:  Home meds:  No current facility-administered medications on file prior to encounter.   Current Outpatient Medications on File Prior to Encounter  Medication Sig Dispense Refill   albuterol  (VENTOLIN  HFA) 108 (90 Base) MCG/ACT inhaler INHALE 2 PUFFS INTO THE LUNGS EVERY 6 HOURS AS NEEDED 18 each 5   atorvastatin  (LIPITOR) 20 MG tablet Take 1 tablet (20 mg total) by mouth daily. 90 tablet 3   calcium  carbonate (TUMS - DOSED IN MG ELEMENTAL CALCIUM ) 500 MG chewable tablet Chew 1 tablet by mouth daily as needed for indigestion or heartburn.     Carboxymethylcellul-Glycerin (CLEAR EYES FOR DRY EYES OP) Place 1 drop into both eyes daily as needed (Dry eyes).     ciprofloxacin  (CIPRO ) 250 MG tablet Take 1 tablet (250 mg total) by mouth 2 (two) times daily. Until surgery. DAY OF SURGERY take 2 tablets twice a day for 48 hours then stop 84 tablet 0   docusate sodium  (COLACE) 100 MG capsule Take 1 capsule (100 mg total) by mouth daily as needed for up to 30 doses. 30 capsule 0   famotidine  (PEPCID ) 20 MG tablet Take 1 tablet (20 mg total) by mouth 2 (two) times daily. 180 tablet 3   fluticasone  furoate-vilanterol (BREO ELLIPTA ) 200-25 MCG/ACT AEPB Inhale 1 puff into the lungs daily. (Patient not taking: Reported on 12/31/2023) 3 each 3   LORazepam  (ATIVAN ) 0.5 MG tablet Take 1 tablet (0.5 mg total) by mouth 2 (two) times daily as needed for anxiety. 30 tablet 2    losartan -hydrochlorothiazide  (HYZAAR) 100-25 MG tablet Take 1 tablet by mouth daily. 30 tablet 8   metoprolol  succinate (TOPROL -XL) 25 MG 24 hr tablet Take 1 tablet (25 mg total) by mouth daily. 30 tablet 11   ondansetron  (ZOFRAN -ODT) 4 MG disintegrating tablet TAKE 1 TABLET BY MOUTH EVERY 8 HOURS AS NEEDED FOR NAUSEA AND VOMITING 18 tablet 3   oxybutynin  (DITROPAN ) 5 MG tablet Take 5 mg by mouth every 8 (eight) hours as needed for bladder spasms.     oxyCODONE -acetaminophen  (PERCOCET/ROXICET) 5-325 MG tablet Take 1 tablet by mouth every 4 (four) hours as needed for up to 18 doses. (Patient not taking: Reported on 12/31/2023) 18 tablet 0   potassium chloride  (KLOR-CON  10) 10 MEQ tablet Take 1 tablet (10 mEq total) by mouth daily. (Patient taking differently: Take 10 mEq by mouth daily as needed.) 90 tablet 3   rivaroxaban  (XARELTO ) 20 MG TABS tablet Take 1 tablet (20 mg total) by mouth daily with supper. Needs appt 30 tablet 0   triamcinolone  cream (KENALOG ) 0.1 % APPLY 1 APPLICATION TOPICALLY 2 (TWO) TIMES DAILY AS NEEDED (ITCHING). 30 g 2     Scheduled  Meds:  lidocaine   1 patch Transdermal Once   Continuous Infusions: PRN Meds:.  Allergies:  Allergies  Allergen Reactions   Morphine  Itching   Shrimp [Shellfish Allergy] Itching and Other (See Comments)    Tongue burns also   Tramadol  Other (See Comments)    Caused confusion   Covid-19 (Mrna) Vaccine Hives   Diltiazem  Hcl Itching    Pt with itching of the feet when bolus given   Latex Rash    Family History  Problem Relation Age of Onset   Stroke Mother    COPD Father    Lymphoma Sister    Alcoholism Sister     Social History:  reports that she quit smoking about 40 years ago. Her smoking use included cigarettes. She started smoking about 47 years ago. She has a 3.5 pack-year smoking history. She has never been exposed to tobacco smoke. She has never used smokeless tobacco. She reports that she does not currently use alcohol.  She reports that she does not currently use drugs after having used the following drugs: Marijuana.  ROS: A complete review of systems was performed.  All systems are negative except for pertinent findings as noted.  Physical Exam:  Vital signs in last 24 hours: Temp:  [97.8 F (36.6 C)-98 F (36.7 C)] 98 F (36.7 C) (12/03 1943) Pulse Rate:  [69-166] 81 (12/03 2145) Resp:  [17-24] 19 (12/03 2145) BP: (67-126)/(47-105) 102/72 (12/03 2145) SpO2:  [93 %-100 %] 100 % (12/03 2145) Weight:  [90.7 kg] 90.7 kg (12/03 1638) Constitutional:  Alert and oriented, No acute distress Cardiovascular: No JVD Respiratory: Normal respiratory effort GI: Abdomen is soft, nontender, nondistended, no abdominal masses GU: Mild right CVA tenderness Lymphatic: No lymphadenopathy Neurologic: Grossly intact, no focal deficits Psychiatric: Normal mood and affect  Laboratory Data:  Recent Labs    01/13/24 1727  WBC 10.6*  HGB 12.6  HCT 39.3  PLT 231    Recent Labs    01/13/24 1727  NA 139  K 3.8  CL 104  GLUCOSE 118*  BUN 32*  CALCIUM  8.6*  CREATININE 1.56*     Results for orders placed or performed during the hospital encounter of 01/13/24 (from the past 24 hours)  Urinalysis, Routine w reflex microscopic -Urine, Clean Catch     Status: Abnormal   Collection Time: 01/13/24  5:10 PM  Result Value Ref Range   Color, Urine YELLOW YELLOW   APPearance TURBID (A) CLEAR   Specific Gravity, Urine 1.015 1.005 - 1.030   pH 5.0 5.0 - 8.0   Glucose, UA NEGATIVE NEGATIVE mg/dL   Hgb urine dipstick LARGE (A) NEGATIVE   Bilirubin Urine NEGATIVE NEGATIVE   Ketones, ur NEGATIVE NEGATIVE mg/dL   Protein, ur 899 (A) NEGATIVE mg/dL   Nitrite NEGATIVE NEGATIVE   Leukocytes,Ua MODERATE (A) NEGATIVE   RBC / HPF >50 0 - 5 RBC/hpf   WBC, UA >50 0 - 5 WBC/hpf   Bacteria, UA MANY (A) NONE SEEN   Squamous Epithelial / HPF 0-5 0 - 5 /HPF   Mucus PRESENT   Basic metabolic panel     Status: Abnormal    Collection Time: 01/13/24  5:27 PM  Result Value Ref Range   Sodium 139 135 - 145 mmol/L   Potassium 3.8 3.5 - 5.1 mmol/L   Chloride 104 98 - 111 mmol/L   CO2 24 22 - 32 mmol/L   Glucose, Bld 118 (H) 70 - 99 mg/dL   BUN 32 (H) 8 -  23 mg/dL   Creatinine, Ser 8.43 (H) 0.44 - 1.00 mg/dL   Calcium  8.6 (L) 8.9 - 10.3 mg/dL   GFR, Estimated 37 (L) >60 mL/min   Anion gap 11 5 - 15  CBC     Status: Abnormal   Collection Time: 01/13/24  5:27 PM  Result Value Ref Range   WBC 10.6 (H) 4.0 - 10.5 K/uL   RBC 4.23 3.87 - 5.11 MIL/uL   Hemoglobin 12.6 12.0 - 15.0 g/dL   HCT 60.6 63.9 - 53.9 %   MCV 92.9 80.0 - 100.0 fL   MCH 29.8 26.0 - 34.0 pg   MCHC 32.1 30.0 - 36.0 g/dL   RDW 85.6 88.4 - 84.4 %   Platelets 231 150 - 400 K/uL   nRBC 0.3 (H) 0.0 - 0.2 %  Troponin I (High Sensitivity)     Status: Abnormal   Collection Time: 01/13/24  5:27 PM  Result Value Ref Range   Troponin I (High Sensitivity) 18 (H) <18 ng/L  Magnesium      Status: Abnormal   Collection Time: 01/13/24  5:27 PM  Result Value Ref Range   Magnesium  1.5 (L) 1.7 - 2.4 mg/dL  I-Stat CG4 Lactic Acid     Status: Abnormal   Collection Time: 01/13/24  5:41 PM  Result Value Ref Range   Lactic Acid, Venous 2.1 (HH) 0.5 - 1.9 mmol/L   Comment NOTIFIED PHYSICIAN   Troponin I (High Sensitivity)     Status: Abnormal   Collection Time: 01/13/24  7:30 PM  Result Value Ref Range   Troponin I (High Sensitivity) 18 (H) <18 ng/L  I-Stat CG4 Lactic Acid     Status: None   Collection Time: 01/13/24  7:47 PM  Result Value Ref Range   Lactic Acid, Venous 1.9 0.5 - 1.9 mmol/L   *Note: Due to a large number of results and/or encounters for the requested time period, some results have not been displayed. A complete set of results can be found in Results Review.   No results found for this or any previous visit (from the past 240 hours).  Renal Function: Recent Labs    01/08/24 0920 01/13/24 1727  CREATININE 1.61* 1.56*   Estimated  Creatinine Clearance: 38.4 mL/min (A) (by C-G formula based on SCr of 1.56 mg/dL (H)).  Radiologic Imaging: CT ABDOMEN PELVIS W CONTRAST Result Date: 01/13/2024 CLINICAL DATA:  Suspected pyelonephritis. EXAM: CT ABDOMEN AND PELVIS WITH CONTRAST TECHNIQUE: Multidetector CT imaging of the abdomen and pelvis was performed using the standard protocol following bolus administration of intravenous contrast. RADIATION DOSE REDUCTION: This exam was performed according to the departmental dose-optimization program which includes automated exposure control, adjustment of the mA and/or kV according to patient size and/or use of iterative reconstruction technique. CONTRAST:  75mL OMNIPAQUE  IOHEXOL  350 MG/ML SOLN COMPARISON:  None Available. FINDINGS: Lower chest: No acute abnormality. Hepatobiliary: There is diffuse fatty infiltration of the liver parenchyma. No focal liver abnormality is seen. No gallstones, gallbladder wall thickening, or biliary dilatation. Pancreas: Unremarkable. No pancreatic ductal dilatation or surrounding inflammatory changes. Spleen: Normal in size without focal abnormality. Adrenals/Urinary Tract: Adrenal glands are unremarkable. Kidneys are normal in size. Bilateral properly positioned endo ureteral stents are in place with a small amount of air seen within the proximal right ureter and intrarenal calyx of the mid right kidney. A 10 mm nonobstructing renal calculus is seen within the mid right kidney. Very mild bilateral hydronephrosis is seen with mild bilateral peripelvic inflammatory fat  stranding noted. Bladder is unremarkable. Stomach/Bowel: Stomach is within normal limits. Appendix appears normal. No evidence of bowel wall thickening, distention, or inflammatory changes. Numerous noninflamed diverticula are seen throughout the large bowel. Vascular/Lymphatic: Aortic atherosclerosis. No enlarged abdominal or pelvic lymph nodes. Reproductive: Status post hysterectomy. No adnexal masses.  Other: No abdominal wall hernia or abnormality. No abdominopelvic ascites. Musculoskeletal: Multilevel degenerative changes are seen throughout the lumbar spine. IMPRESSION: 1. Bilateral properly positioned endo ureteral stents with a small amount of air seen within the proximal right ureter and intrarenal calyx of the mid right kidney. 2. 10 mm nonobstructing renal calculus within the mid right kidney. 3. Very mild bilateral hydronephrosis with mild bilateral peripelvic inflammatory fat stranding. While this may be postprocedural in origin, sequelae associated with acute pyelonephritis cannot be excluded. 4. Colonic diverticulosis. 5. Hepatic steatosis. 6. Aortic atherosclerosis. Electronically Signed   By: Suzen Dials M.D.   On: 01/13/2024 21:32   DG Chest 2 View Result Date: 01/13/2024 EXAM: 2 VIEW(S) XRAY OF THE CHEST 01/13/2024 05:56:00 PM COMPARISON: 04/29/2023 CLINICAL HISTORY: SHOB, palpitations FINDINGS: LUNGS AND PLEURA: Under penetrated radiograph. Low lung volumes. No focal pulmonary opacity. No pleural effusion. No pneumothorax. HEART AND MEDIASTINUM: Cardiomegaly. BONES AND SOFT TISSUES: No acute osseous abnormality. IMPRESSION: 1. Cardiomegaly. 2. Low lung volumes. Electronically signed by: Norman Gatlin MD 01/13/2024 07:06 PM EST RP Workstation: [id number]    I independently reviewed the above imaging studies.  Impression/Recommendation: 1.  Right ureteral and left renal calculi now with apparent pyelonephritis: Would recommend empiric antibiotic therapy with pending urine cultures.  Assuming she clinically improves, it still may be appropriate for her to undergo ureteroscopy and stent exchange by Dr. Selma on 01/15/2024 as currently scheduled.  She would need to be transferred to [facility].  In addition, if appropriate, she should stop Xarelto  in preparation for her procedure.  If she is unable to stop her anticoagulation or if she is not medically optimized by Friday, her procedure  may need to be rescheduled.  I will notify Dr. Selma of her admission.  [name] 01/13/2024, 10:09 PM  [name]. MD   CC: Dr. Caron Salt

## 2024-01-13 NOTE — ED Triage Notes (Addendum)
 Quick triage note: Pt c/o palpitations intermittent in nature x 3 days, reports can feel heart racing reports aggravating factor anxiety, reports accompanying SHOB when episodes occur. Also reports generalized weakness.   Hx Afib

## 2024-01-13 NOTE — ED Notes (Signed)
 CCMD called, pt on monitor

## 2024-01-13 NOTE — ED Notes (Signed)
 Pt used bedside commode with help of this RN, reports feeling continually weak but able to stand and pivot with minimal help. Pt back in bed, call light within reach.

## 2024-01-13 NOTE — Assessment & Plan Note (Signed)
-   Will continue IV Rocephin . - Will follow blood and urine cultures. - Will continue continue IV hydration with Dr. Raford. - Sepsis is manifested by tachycardia and tachypnea.

## 2024-01-13 NOTE — ED Provider Notes (Signed)
 [place] EMERGENCY DEPARTMENT AT [facility] Provider Note   CSN: [id number] Arrival date & time: 01/13/24  1628     Patient presents with: Palpitations, Weakness, and Tachycardia   [name] is a 63 y.o. female.  {Add pertinent medical, surgical, social history, OB history to HPI:32947} This is a 63 year old female presenting emergency department for palpitations.  Started Sunday evening, intermittent.  Feels her heart is racing, becomes lightheaded and short of breath.  No chest pain.  Does have a history of A-fib, compliant with her medications.  Did not take her Xarelto  today as she is due to have a kidney procedure in a few days.  She has been compliant with her rate controlling medications.   Palpitations Associated symptoms: weakness   Weakness      Prior to Admission medications   Medication Sig Start Date End Date Taking? Authorizing Provider  albuterol  (VENTOLIN  HFA) 108 (90 Base) MCG/ACT inhaler INHALE 2 PUFFS INTO THE LUNGS EVERY 6 HOURS AS NEEDED 08/06/23   Norleen Lynwood ORN, MD  atorvastatin  (LIPITOR) 20 MG tablet Take 1 tablet (20 mg total) by mouth daily. 08/06/23   Norleen Lynwood ORN, MD  calcium  carbonate (TUMS - DOSED IN MG ELEMENTAL CALCIUM ) 500 MG chewable tablet Chew 1 tablet by mouth daily as needed for indigestion or heartburn.    [provider]  Carboxymethylcellul-Glycerin (CLEAR EYES FOR DRY EYES OP) Place 1 drop into both eyes daily as needed (Dry eyes).    [provider]  ciprofloxacin  (CIPRO ) 250 MG tablet Take 1 tablet (250 mg total) by mouth 2 (two) times daily. Until surgery. DAY OF SURGERY take 2 tablets twice a day for 48 hours then stop 12/09/23 01/20/24  Melvenia Corean SAILOR, NP  docusate sodium  (COLACE) 100 MG capsule Take 1 capsule (100 mg total) by mouth daily as needed for up to 30 doses. 10/30/23   Selma Donnice SAUNDERS, MD  famotidine  (PEPCID ) 20 MG tablet Take 1 tablet (20 mg total) by mouth 2 (two) times daily. 08/06/23    Norleen Lynwood ORN, MD  fluticasone  furoate-vilanterol (BREO ELLIPTA ) 200-25 MCG/ACT AEPB Inhale 1 puff into the lungs daily. Patient not taking: Reported on 12/31/2023 08/06/23   Norleen Lynwood ORN, MD  LORazepam  (ATIVAN ) 0.5 MG tablet Take 1 tablet (0.5 mg total) by mouth 2 (two) times daily as needed for anxiety. 08/06/23   Norleen Lynwood ORN, MD  losartan -hydrochlorothiazide  (HYZAAR) 100-25 MG tablet Take 1 tablet by mouth daily. 08/06/23   Norleen Lynwood ORN, MD  metoprolol  succinate (TOPROL -XL) 25 MG 24 hr tablet Take 1 tablet (25 mg total) by mouth daily. 12/19/22   Terra Fairy PARAS, PA-C  ondansetron  (ZOFRAN -ODT) 4 MG disintegrating tablet TAKE 1 TABLET BY MOUTH EVERY 8 HOURS AS NEEDED FOR NAUSEA AND VOMITING 07/24/23   Webb, Padonda B, FNP  oxybutynin  (DITROPAN ) 5 MG tablet Take 5 mg by mouth every 8 (eight) hours as needed for bladder spasms. 12/03/23   [provider]  oxyCODONE -acetaminophen  (PERCOCET/ROXICET) 5-325 MG tablet Take 1 tablet by mouth every 4 (four) hours as needed for up to 18 doses. Patient not taking: Reported on 12/31/2023 10/30/23   Selma Donnice SAUNDERS, MD  potassium chloride  (KLOR-CON  10) 10 MEQ tablet Take 1 tablet (10 mEq total) by mouth daily. Patient taking differently: Take 10 mEq by mouth daily as needed. 08/06/23   Norleen Lynwood ORN, MD  rivaroxaban  (XARELTO ) 20 MG TABS tablet Take 1 tablet (20 mg total) by mouth daily with supper.  Needs appt 01/12/24   Terra Fairy PARAS, PA-C  triamcinolone  cream (KENALOG ) 0.1 % APPLY 1 APPLICATION TOPICALLY 2 (TWO) TIMES DAILY AS NEEDED (ITCHING). 08/31/23   Norleen Lynwood ORN, MD    Allergies: Morphine , Shrimp [shellfish allergy], Tramadol , Covid-19 (mrna) vaccine, Diltiazem  hcl, and Latex    Review of Systems  Cardiovascular:  Positive for palpitations.  Neurological:  Positive for weakness.    Updated Vital Signs BP 102/71   Pulse 92   Temp 97.8 F (36.6 C) (Oral)   Resp 17   Ht 5' 1 (1.549 m)   Wt 90.7 kg   SpO2 100%   BMI 37.79 kg/m    Physical Exam Vitals and nursing note reviewed.     (all labs ordered are listed, but only abnormal results are displayed) Labs Reviewed  I-STAT CG4 LACTIC ACID, ED - Abnormal; Notable for the following components:      Result Value   Lactic Acid, Venous 2.1 (*)    All other components within normal limits  BASIC METABOLIC PANEL WITH GFR  CBC  PROTIME-INR  MAGNESIUM   URINALYSIS, ROUTINE W REFLEX MICROSCOPIC  TROPONIN I (HIGH SENSITIVITY)    EKG: EKG Interpretation Date/Time:  Wednesday January 13 2024 16:39:03 EST Ventricular Rate:  151 PR Interval:    QRS Duration:  102 QT Interval:  308 QTC Calculation: 488 R Axis:   -28  Text Interpretation: Supraventricular tachycardia Cannot rule out Anterior infarct , age undetermined Abnormal ECG When compared with ECG of 15-Nov-2023 07:41, PREVIOUS ECG IS PRESENT Confirmed by Neysa Clap [phone]) on 01/13/2024 4:50:13 PM  Radiology: No results found.  {Document cardiac monitor, telemetry assessment procedure when appropriate:32947} Procedures   Medications Ordered in the ED  lactated ringers  bolus 1,000 mL (1,000 mLs Intravenous New Bag/Given 01/13/24 1727)      {Click here for ABCD2, HEART and other calculators REFRESH Note before signing:1}                              Medical Decision Making Amount and/or Complexity of Data Reviewed Labs: ordered. Radiology: ordered.   ***  {Document critical care time when appropriate  Document review of labs and clinical decision tools ie CHADS2VASC2, etc  Document your independent review of radiology images and any outside records  Document your discussion with family members, caretakers and with consultants  Document social determinants of health affecting pt's care  Document your decision making why or why not admission, treatments were needed:32947:::1}   Final diagnoses:  None    ED Discharge Orders     None

## 2024-01-13 NOTE — Assessment & Plan Note (Signed)
-   Will continue antihypertensive therapy.

## 2024-01-13 NOTE — Assessment & Plan Note (Signed)
 Will continue statin therapy

## 2024-01-13 NOTE — ED Notes (Signed)
 pt walked to and from bathroom, when she got back and was sitting down started to feel dizzy with palpitations, her HR was 160-180s afib then went back down to 70s after a minute or so. this has happened twice since getting back from the bathroom, pt is asking for something to relax her and did not want the lidocaine  patch at this time. MD notified, pt connected to monitors with call light within reach

## 2024-01-13 NOTE — Assessment & Plan Note (Signed)
-   Will continue Toprol -XL. - The patient's rhythm was controlled with p.o. Lopressor  in the ER.

## 2024-01-13 NOTE — ED Notes (Signed)
 Pt BP now 67/47, HR 98. Pt reports feeling very faint, dizzy and weak everywhere. MD aware. Pt connected to monitors and in bed with both side rails up

## 2024-01-13 NOTE — Assessment & Plan Note (Signed)
-   The patient will be admitted to a telemetry bed. - Will continue antibiotic therapy with IV Rocephin . - Urology consult to be obtained given her ureteral stent. - Pain management will be provided.

## 2024-01-13 NOTE — ED Notes (Signed)
 Pt HR 90s and BP back up to 123/57. Pt reports still feeling weak but less dizzy, just trying to relax. MD aware

## 2024-01-13 NOTE — ED Notes (Signed)
 Pt heart rate seems to be going in and out of afib more frequently. she is currently 150s. pt will convert for just a minute and then go right back up BP now dropped to 90/77. pt reports she is feeling weak and dizzy still, MD aware

## 2024-01-13 NOTE — H&P (Incomplete)
 [name]   PATIENT NAME: [name]    MR#:  [id number]  DATE OF BIRTH:  [birth year]  DATE OF ADMISSION:  01/13/2024  PRIMARY CARE PHYSICIAN: Norleen Lynwood ORN, MD   Patient is coming from: Home  REQUESTING/REFERRING PHYSICIAN: Neysa Clap, DO  CHIEF COMPLAINT:   Chief Complaint  Patient presents with  . Palpitations  . Weakness  . Tachycardia    HISTORY OF PRESENT ILLNESS:  [name] is a 63 y.o. African-American female with medical history significant for paroxysmal atrial fibrillation on Xarelto , asthma, anxiety, alcohol abuse, GERD, DJD, hypertension, dyslipidemia, and peripheral vascular disease as well as PE, who presented to the emergency room with acute onset of generalized weakness, palpitations and tachycardia, with associated lightheadedness and dyspnea.  She admitted to bilateral flank pain as well as dysuria, urinary frequency and urgency.  She had bilateral ureteral stents placed more than a month ago.  She was expected to have them removed on 12/5.  She denied any chest pain.  She did not take her Xarelto  today for expectation of renal procedure in a few days.  She has been compliant with the rest of her medications.   ED Course: Upon presentation to the emergency room, heart rate was 161 with respiratory rate of 23, with otherwise normal vital signs.  Labs revealed CBC with WBCs of 10.6, lactic acid 2.1 and later 1.9.  BMP with BUN of 32 and creatinine 1.56 with a calcium  of 8.6.  High-sensitivity troponin was 18 twice. EKG as reviewed by me : EKG showed SVT with a rate of 151.  She apparently had paroxysms of atrial fibrillation with RVR on monitor per the EDP. Imaging:  2 view chest x-ray showed cardiomegaly and low lung volumes. Abdominal and pelvic CT scan with contrast revealed the following: 1. Bilateral properly positioned endo ureteral stents with a small amount of air seen within the proximal right ureter and intrarenal calyx of the mid right  kidney. 2. 10 mm nonobstructing renal calculus within the mid right kidney. 3. Very mild bilateral hydronephrosis with mild bilateral peripelvic inflammatory fat stranding. While this may be postprocedural in origin, sequelae associated with acute pyelonephritis cannot be excluded. 4. Colonic diverticulosis. 5. Hepatic steatosis. 6. Aortic atherosclerosis.  The patient was given 1 p.o. Percocet, 25 mg of p.o. Lopressor , 2 g of IV magnesium  sulfate, 0.5 mg of p.o. Ativan  and 1 L bolus of IV lactated Ringer  as well as 15 mg of IV Toradol  and 1 g of IV Rocephin .  She will be admitted to a telemetry bed for further evaluation and management. PAST MEDICAL HISTORY:   Past Medical History:  Diagnosis Date  . Abscess of bladder 07/28/2016  . Alcohol dependence (HCC)   . Allergic rhinitis 12/06/2013  . Anxiety   . Asthma 03/16/2015  . Atrial flutter with rapid ventricular response (HCC) 04/01/2020  . COLONIC POLYPS, HX OF 04/05/2010  . COVID-19 09/2023  . DIVERTICULITIS, HX OF 04/05/2010  . DJD (degenerative joint disease)    right knee, mot to severe  . Dysrhythmia   . Fatty liver   . GERD (gastroesophageal reflux disease)    no meds  . Heart murmur    hx of   . Hemorrhoids   . History of kidney stones   . Hyperlipidemia   . Hypertension   . Impaired glucose tolerance 12/06/2013  . Morbid obesity with BMI of 50.0-59.9, adult (HCC)   . Nausea & vomiting 05/24/2022  .  Peripheral vascular disease   . Pulmonary embolism (HCC)     PAST SURGICAL HISTORY:   Past Surgical History:  Procedure Laterality Date  . ABDOMINAL HYSTERECTOMY  age 38   fibroids  . BREAST BIOPSY Left   . COLONOSCOPY WITH PROPOFOL  N/A 07/25/2016   Procedure: COLONOSCOPY WITH PROPOFOL ;  Surgeon: Rollin Dover, MD;  Location: [facility];  Service: Endoscopy;  Laterality: N/A;  . colonscopy     x 2  . CYSTOSCOPY W/ RETROGRADES Right 10/30/2023   Procedure: CYSTOSCOPY, WITH RETROGRADE PYELOGRAM;  Surgeon:  Selma Donnice SAUNDERS, MD;  Location: [facility];  Service: Urology;  Laterality: Right;  . CYSTOSCOPY W/ URETERAL STENT PLACEMENT Right 09/05/2021   Procedure: CYSTOSCOPY WITH RETROGRADE PYELOGRAM/URETERAL STENT PLACEMENT;  Surgeon: Selma Donnice SAUNDERS, MD;  Location: [facility];  Service: Urology;  Laterality: Right;  . CYSTOSCOPY W/ URETERAL STENT PLACEMENT Right 10/04/2023   Procedure: CYSTOSCOPY, WITH RETROGRADE PYELOGRAM AND URETERAL STENT INSERTION;  Surgeon: Shane Steffan BROCKS, MD;  Location: [facility] OR;  Service: Urology;  Laterality: Right;  . CYSTOSCOPY W/ URETERAL STENT PLACEMENT Right 11/15/2023   Procedure: CYSTOSCOPY, WITH RETROGRADE PYELOGRAM AND URETERAL STENT INSERTION;  Surgeon: Selma Donnice SAUNDERS, MD;  Location: [facility] OR;  Service: Urology;  Laterality: Right;  . CYSTOSCOPY WITH RETROGRADE PYELOGRAM, URETEROSCOPY AND STENT PLACEMENT Left 08/14/2022   Procedure: CYSTOSCOPY WITH RETROGRADE PYELOGRAM;  Surgeon: Selma Donnice SAUNDERS, MD;  Location: [facility] OR;  Service: Urology;  Laterality: Left;  . CYSTOSCOPY WITH URETEROSCOPY AND STENT PLACEMENT Left 08/14/2022   Procedure: URETEROSCOPY AND STENT PLACEMENT;  Surgeon: Selma Donnice SAUNDERS, MD;  Location: [facility] OR;  Service: Urology;  Laterality: Left;  . CYSTOSCOPY/URETEROSCOPY/HOLMIUM LASER/STENT PLACEMENT Right 09/30/2021   Procedure: CYSTOSCOPY/ RETROGRADE/URETEROSCOPY/HOLMIUM LASER/STENT PLACEMENT;  Surgeon: Selma Donnice SAUNDERS, MD;  Location: [facility];  Service: Urology;  Laterality: Right;  . CYSTOSCOPY/URETEROSCOPY/HOLMIUM LASER/STENT PLACEMENT Left 09/12/2022   Procedure: CYSTOSCOPY/LEFT RETROGRADE PYELOGRAM/LEFT URETEROSCOPY/HOLMIUM LASER/LEFT STENT EXCHANGE;  Surgeon: Selma Donnice SAUNDERS, MD;  Location: [facility];  Service: Urology;  Laterality: Left;  75 MINUTES NEEDED FOR CASE  . CYSTOSCOPY/URETEROSCOPY/HOLMIUM LASER/STENT PLACEMENT Right 10/30/2023   Procedure: CYSTOSCOPY/URETEROSCOPY/HOLMIUM LASER/STENT PLACEMENT;  Surgeon: Selma Donnice SAUNDERS, MD;  Location: [facility];  Service: Urology;   Laterality: Right;  . IR RADIOLOGIST EVAL & MGMT  08/12/2016  . IR RADIOLOGIST EVAL & MGMT  08/21/2016  . KNEE ARTHROSCOPY     left   . TOTAL KNEE ARTHROPLASTY  07/29/2011   Procedure: TOTAL KNEE ARTHROPLASTY;  Surgeon: Donnice JONETTA Car, MD;  Location: [facility];  Service: Orthopedics;  Laterality: Right;  . TOTAL KNEE ARTHROPLASTY Left 12/14/2012   Procedure: LEFT TOTAL KNEE ARTHROPLASTY;  Surgeon: Donnice JONETTA Car, MD;  Location: [facility];  Service: Orthopedics;  Laterality: Left;    SOCIAL HISTORY:   Social History   Tobacco Use  . Smoking status: Former    Current packs/day: 0.00    Average packs/day: 0.5 packs/day for 7.0 years (3.5 ttl pk-yrs)    Types: Cigarettes    Start date: 02/11/1976    Quit date: 02/11/1983    Years since quitting: 40.9    Passive exposure: Never  . Smokeless tobacco: Never  Substance Use Topics  . Alcohol use: Not Currently    Comment: occ    FAMILY HISTORY:   Family History  Problem Relation Age of Onset  . Stroke Mother   . COPD Father   . Lymphoma Sister   . Alcoholism Sister     DRUG ALLERGIES:   Allergies  Allergen Reactions  . Morphine  Itching  . Shrimp [Shellfish Allergy] Itching and Other (See Comments)    Tongue burns also  . Tramadol  Other (See Comments)    Caused confusion  . Covid-19 (Mrna) Vaccine Hives  . Diltiazem  Hcl Itching    Pt with itching of the feet when bolus given  . Latex Rash    REVIEW OF SYSTEMS:   ROS As per history of present illness. All pertinent systems were reviewed above. Constitutional, HEENT, cardiovascular, respiratory, GI, GU, musculoskeletal, neuro, psychiatric, endocrine, integumentary and hematologic systems were reviewed and are otherwise negative/unremarkable except for positive findings mentioned above in the HPI.   MEDICATIONS AT HOME:   Prior to Admission medications   Medication Sig Start Date End Date Taking? Authorizing Provider  albuterol  (VENTOLIN  HFA) 108 (90 Base) MCG/ACT inhaler  INHALE 2 PUFFS INTO THE LUNGS EVERY 6 HOURS AS NEEDED 08/06/23   Norleen Lynwood ORN, MD  atorvastatin  (LIPITOR) 20 MG tablet Take 1 tablet (20 mg total) by mouth daily. 08/06/23   Norleen Lynwood ORN, MD  calcium  carbonate (TUMS - DOSED IN MG ELEMENTAL CALCIUM ) 500 MG chewable tablet Chew 1 tablet by mouth daily as needed for indigestion or heartburn.    [provider]  Carboxymethylcellul-Glycerin (CLEAR EYES FOR DRY EYES OP) Place 1 drop into both eyes daily as needed (Dry eyes).    [provider]  ciprofloxacin  (CIPRO ) 250 MG tablet Take 1 tablet (250 mg total) by mouth 2 (two) times daily. Until surgery. DAY OF SURGERY take 2 tablets twice a day for 48 hours then stop 12/09/23 01/20/24  Melvenia Corean SAILOR, NP  docusate sodium  (COLACE) 100 MG capsule Take 1 capsule (100 mg total) by mouth daily as needed for up to 30 doses. 10/30/23   Selma Donnice SAUNDERS, MD  famotidine  (PEPCID ) 20 MG tablet Take 1 tablet (20 mg total) by mouth 2 (two) times daily. 08/06/23   Norleen Lynwood ORN, MD  fluticasone  furoate-vilanterol (BREO ELLIPTA ) 200-25 MCG/ACT AEPB Inhale 1 puff into the lungs daily. Patient not taking: Reported on 12/31/2023 08/06/23   Norleen Lynwood ORN, MD  LORazepam  (ATIVAN ) 0.5 MG tablet Take 1 tablet (0.5 mg total) by mouth 2 (two) times daily as needed for anxiety. 08/06/23   Norleen Lynwood ORN, MD  losartan -hydrochlorothiazide  (HYZAAR) 100-25 MG tablet Take 1 tablet by mouth daily. 08/06/23   Norleen Lynwood ORN, MD  metoprolol  succinate (TOPROL -XL) 25 MG 24 hr tablet Take 1 tablet (25 mg total) by mouth daily. 12/19/22   Terra Fairy PARAS, PA-C  ondansetron  (ZOFRAN -ODT) 4 MG disintegrating tablet TAKE 1 TABLET BY MOUTH EVERY 8 HOURS AS NEEDED FOR NAUSEA AND VOMITING 07/24/23   Webb, Padonda B, FNP  oxybutynin  (DITROPAN ) 5 MG tablet Take 5 mg by mouth every 8 (eight) hours as needed for bladder spasms. 12/03/23   [provider]  oxyCODONE -acetaminophen  (PERCOCET/ROXICET) 5-325 MG tablet Take 1 tablet by mouth  every 4 (four) hours as needed for up to 18 doses. Patient not taking: Reported on 12/31/2023 10/30/23   Selma Donnice SAUNDERS, MD  potassium chloride  (KLOR-CON  10) 10 MEQ tablet Take 1 tablet (10 mEq total) by mouth daily. Patient taking differently: Take 10 mEq by mouth daily as needed. 08/06/23   Norleen Lynwood ORN, MD  rivaroxaban  (XARELTO ) 20 MG TABS tablet Take 1 tablet (20 mg total) by mouth daily with supper. Needs appt 01/12/24   Terra Fairy PARAS, PA-C  triamcinolone  cream (KENALOG ) 0.1 % APPLY 1 APPLICATION TOPICALLY 2 (TWO)  TIMES DAILY AS NEEDED (ITCHING). 08/31/23   Norleen Lynwood ORN, MD      VITAL SIGNS:  Blood pressure 122/79, pulse 89, temperature 97.9 F (36.6 C), resp. rate 20, height 5' 1 (1.549 m), weight 90.7 kg, SpO2 100%.  PHYSICAL EXAMINATION:  Physical Exam  GENERAL:  63 y.o.-year-old African-American female patient lying in the bed with no acute distress.  EYES: Pupils equal, round, reactive to light and accommodation. No scleral icterus. Extraocular muscles intact.  HEENT: Head atraumatic, normocephalic. Oropharynx and nasopharynx clear.  NECK:  Supple, no jugular venous distention. No thyroid  enlargement, no tenderness.  LUNGS: Normal breath sounds bilaterally, no wheezing, rales,rhonchi or crepitation. No use of accessory muscles of respiration.  CARDIOVASCULAR: Regular rate and rhythm, S1, S2 normal. No murmurs, rubs, or gallops.  ABDOMEN: Soft, nondistended, nontender. Bowel sounds present. No organomegaly or mass.  EXTREMITIES: No pedal edema, cyanosis, or clubbing.  NEUROLOGIC: Cranial nerves II through XII are intact. Muscle strength 5/5 in all extremities. Sensation intact. Gait not checked.  PSYCHIATRIC: The patient is alert and oriented x 3.  Normal affect and good eye contact. SKIN: No obvious rash, lesion, or ulcer.   LABORATORY PANEL:   CBC Recent Labs  Lab 01/13/24 1727  WBC 10.6*  HGB 12.6  HCT 39.3  PLT 231    ------------------------------------------------------------------------------------------------------------------  Chemistries  Recent Labs  Lab 01/13/24 1727  NA 139  K 3.8  CL 104  CO2 24  GLUCOSE 118*  BUN 32*  CREATININE 1.56*  CALCIUM  8.6*  MG 1.5*   ------------------------------------------------------------------------------------------------------------------  Cardiac Enzymes No results for input(s): TROPONINI in the last 168 hours. ------------------------------------------------------------------------------------------------------------------  RADIOLOGY:  CT ABDOMEN PELVIS W CONTRAST Result Date: 01/13/2024 CLINICAL DATA:  Suspected pyelonephritis. EXAM: CT ABDOMEN AND PELVIS WITH CONTRAST TECHNIQUE: Multidetector CT imaging of the abdomen and pelvis was performed using the standard protocol following bolus administration of intravenous contrast. RADIATION DOSE REDUCTION: This exam was performed according to the departmental dose-optimization program which includes automated exposure control, adjustment of the mA and/or kV according to patient size and/or use of iterative reconstruction technique. CONTRAST:  75mL OMNIPAQUE  IOHEXOL  350 MG/ML SOLN COMPARISON:  None Available. FINDINGS: Lower chest: No acute abnormality. Hepatobiliary: There is diffuse fatty infiltration of the liver parenchyma. No focal liver abnormality is seen. No gallstones, gallbladder wall thickening, or biliary dilatation. Pancreas: Unremarkable. No pancreatic ductal dilatation or surrounding inflammatory changes. Spleen: Normal in size without focal abnormality. Adrenals/Urinary Tract: Adrenal glands are unremarkable. Kidneys are normal in size. Bilateral properly positioned endo ureteral stents are in place with a small amount of air seen within the proximal right ureter and intrarenal calyx of the mid right kidney. A 10 mm nonobstructing renal calculus is seen within the mid right kidney. Very mild  bilateral hydronephrosis is seen with mild bilateral peripelvic inflammatory fat stranding noted. Bladder is unremarkable. Stomach/Bowel: Stomach is within normal limits. Appendix appears normal. No evidence of bowel wall thickening, distention, or inflammatory changes. Numerous noninflamed diverticula are seen throughout the large bowel. Vascular/Lymphatic: Aortic atherosclerosis. No enlarged abdominal or pelvic lymph nodes. Reproductive: Status post hysterectomy. No adnexal masses. Other: No abdominal wall hernia or abnormality. No abdominopelvic ascites. Musculoskeletal: Multilevel degenerative changes are seen throughout the lumbar spine. IMPRESSION: 1. Bilateral properly positioned endo ureteral stents with a small amount of air seen within the proximal right ureter and intrarenal calyx of the mid right kidney. 2. 10 mm nonobstructing renal calculus within the mid right kidney. 3. Very mild bilateral hydronephrosis with mild  bilateral peripelvic inflammatory fat stranding. While this may be postprocedural in origin, sequelae associated with acute pyelonephritis cannot be excluded. 4. Colonic diverticulosis. 5. Hepatic steatosis. 6. Aortic atherosclerosis. Electronically Signed   By: Suzen Dials M.D.   On: 01/13/2024 21:32   DG Chest 2 View Result Date: 01/13/2024 EXAM: 2 VIEW(S) XRAY OF THE CHEST 01/13/2024 05:56:00 PM COMPARISON: 04/29/2023 CLINICAL HISTORY: SHOB, palpitations FINDINGS: LUNGS AND PLEURA: Under penetrated radiograph. Low lung volumes. No focal pulmonary opacity. No pleural effusion. No pneumothorax. HEART AND MEDIASTINUM: Cardiomegaly. BONES AND SOFT TISSUES: No acute osseous abnormality. IMPRESSION: 1. Cardiomegaly. 2. Low lung volumes. Electronically signed by: Norman Gatlin MD 01/13/2024 07:06 PM EST RP Workstation: [id number]      IMPRESSION AND PLAN:  Assessment and Plan: * Pyelonephritis - The patient will be admitted to a telemetry bed. - The patient is status post  bilateral ureteral stents which were about to be removed on 12/5. - Will continue antibiotic therapy with IV Rocephin . - Urology consult to be obtained given her ureteral stent. - I notified Dr. Renda about the patient. - Pain management will be provided.  Sepsis due to gram-negative UTI (HCC) - Will continue IV Rocephin . - Will follow blood and urine cultures. - Will continue continue IV hydration with Dr. Raford. - Sepsis is manifested by tachycardia and tachypnea.  Paroxysmal atrial fibrillation with RVR (HCC) - Will continue Toprol -XL. - The patient's rhythm was controlled with p.o. Lopressor  in the ER.  Hypomagnesemia - This could be contributing to her A-fib with RVR. - Will replace magnesium  level.  Essential hypertension - Will continue antihypertensive therapy.   Dyslipidemia - Will continue statin therapy.   DVT prophylaxis: Xarelto  Advanced Care Planning:  Code Status: full code.  Family Communication:  The plan of care was discussed in details with the patient (and family). I answered all questions. The patient agreed to proceed with the above mentioned plan. Further management will depend upon hospital course. Disposition Plan: Back to previous home environment Consults called: Urology. All the records are reviewed and case discussed with ED provider.  Status is: Inpatient  At the time of the admission, it appears that the appropriate admission status for this patient is inpatient.  This is judged to be reasonable and necessary in order to provide the required intensity of service to ensure the patient's safety given the presenting symptoms, physical exam findings and initial radiographic and laboratory data in the context of comorbid conditions.  The patient requires inpatient status due to high intensity of service, high risk of further deterioration and high frequency of surveillance required.  I certify that at the time of admission, it is my clinical judgment  that the patient will require inpatient hospital care extending more than 2 midnights.                            Dispo: The patient is from: Home              Anticipated d/c is to: Home              Patient currently is not medically stable to d/c.              Difficult to place patient: No  Madison DELENA Peaches M.D on 01/14/2024 at 3:20 AM  [facility]   From 7 PM-7 AM, contact night-coverage [email or web address]  CC: Primary care physician; Norleen Lynwood ORN, MD

## 2024-01-13 NOTE — ED Triage Notes (Signed)
 Pt arrives POV c/o palpations for 3 days. Patient states she was panicking and SHOB. Endorses dizziness and headache. Hx of a fib

## 2024-01-14 ENCOUNTER — Encounter (HOSPITAL_COMMUNITY): Payer: Self-pay | Admitting: Family Medicine

## 2024-01-14 ENCOUNTER — Encounter (HOSPITAL_COMMUNITY): Payer: Self-pay | Admitting: Urology

## 2024-01-14 DIAGNOSIS — N12 Tubulo-interstitial nephritis, not specified as acute or chronic: Secondary | ICD-10-CM | POA: Diagnosis not present

## 2024-01-14 LAB — CBC
HCT: 35.6 % — ABNORMAL LOW (ref 36.0–46.0)
Hemoglobin: 11.3 g/dL — ABNORMAL LOW (ref 12.0–15.0)
MCH: 29.6 pg (ref 26.0–34.0)
MCHC: 31.7 g/dL (ref 30.0–36.0)
MCV: 93.2 fL (ref 80.0–100.0)
Platelets: 207 K/uL (ref 150–400)
RBC: 3.82 MIL/uL — ABNORMAL LOW (ref 3.87–5.11)
RDW: 14.3 % (ref 11.5–15.5)
WBC: 9.3 K/uL (ref 4.0–10.5)
nRBC: 0.4 % — ABNORMAL HIGH (ref 0.0–0.2)

## 2024-01-14 LAB — BASIC METABOLIC PANEL WITH GFR
Anion gap: 5 (ref 5–15)
BUN: 32 mg/dL — ABNORMAL HIGH (ref 8–23)
CO2: 27 mmol/L (ref 22–32)
Calcium: 8.2 mg/dL — ABNORMAL LOW (ref 8.9–10.3)
Chloride: 106 mmol/L (ref 98–111)
Creatinine, Ser: 1.61 mg/dL — ABNORMAL HIGH (ref 0.44–1.00)
GFR, Estimated: 36 mL/min — ABNORMAL LOW (ref >60)
Glucose, Bld: 135 mg/dL — ABNORMAL HIGH (ref 70–99)
Potassium: 3.8 mmol/L (ref 3.5–5.1)
Sodium: 138 mmol/L (ref 135–145)

## 2024-01-14 LAB — CORTISOL-AM, BLOOD: Cortisol - AM: 4.4 ug/dL — ABNORMAL LOW (ref 6.7–22.6)

## 2024-01-14 LAB — MAGNESIUM: Magnesium: 1.8 mg/dL (ref 1.7–2.4)

## 2024-01-14 LAB — PROTIME-INR
INR: 1.1 (ref 0.8–1.2)
Prothrombin Time: 15.1 s (ref 11.4–15.2)

## 2024-01-14 MED ORDER — OXYCODONE HCL 5 MG PO TABS
5.0000 mg | ORAL_TABLET | Freq: Four times a day (QID) | ORAL | Status: DC | PRN
  Administered 2024-01-14 – 2024-01-18 (×14): 5 mg via ORAL
  Filled 2024-01-14 (×14): qty 1

## 2024-01-14 MED ORDER — SODIUM CHLORIDE 0.9 % IV SOLN
1.0000 g | INTRAVENOUS | Status: DC
  Administered 2024-01-14 – 2024-01-16 (×3): 1 g via INTRAVENOUS
  Filled 2024-01-14 (×4): qty 1000

## 2024-01-14 MED ORDER — MAGNESIUM SULFATE 2 GM/50ML IV SOLN
2.0000 g | Freq: Once | INTRAVENOUS | Status: AC
  Administered 2024-01-14: 2 g via INTRAVENOUS
  Filled 2024-01-14: qty 50

## 2024-01-14 MED ORDER — ALBUTEROL SULFATE (2.5 MG/3ML) 0.083% IN NEBU: 3.0000 mL | INHALATION_SOLUTION | RESPIRATORY_TRACT | Status: DC | PRN

## 2024-01-14 MED ORDER — ONDANSETRON 4 MG PO TBDP
4.0000 mg | ORAL_TABLET | Freq: Three times a day (TID) | ORAL | Status: DC | PRN
  Administered 2024-01-15 (×2): 4 mg via ORAL
  Filled 2024-01-14 (×2): qty 1

## 2024-01-14 MED ORDER — MENTHOL 3 MG MT LOZG
1.0000 | LOZENGE | OROMUCOSAL | Status: DC | PRN
  Filled 2024-01-14: qty 9

## 2024-01-14 NOTE — Progress Notes (Signed)
 [facility]   ROUNDING   NOTE SHAELEY SEGALL FMW:[id number]  DOB: 01-10-1961  DOA: 01/13/2024  PCP: Norleen Lynwood ORN, MD  01/14/2024,9:39 AM  LOS: 1 day    Code Status: Full code     from: Home   63 year old female  permanent A-fib CHADVASC >4 on Xarelto  DM TY 2 Probable OSA not on CPAP Previous ESBL urinary infections Previous right ureteric stenting 09/05/2021 by urology in the setting of severe sepsis hydronephrosis with E. coli as well as AKI finished 14 days of therapy Prior bladder abscess fistula related to diverticular abscess in 2018-left ureteric stent placed 08/14/2022 Previous ethanolism and admissions for the same  She was readmitted 10/04/2023 to 10/08/2023 with right-sided flank pain and was treated with 14 days of antibiotics completing Augmentin  at home Readmitted 10/5 through 10/9 with sepsis once again hydronephrosis and obstructive stone at right UPJ had cystoscopy bilateral retrograde pyelogram and bilateral ureteric stents 11/15/2023 and ESBL was treated with ertapenem  for 2 weeks and had a PICC line placed  Apparently she was scheduled for definitive surgery 01/15/2024   12/3 came to emergency room with palpitation shortness of breath anxiety + dizziness and headache Found to be in rapid A-fib initially with in the 160s and additionally was quite hypotensive with lactic acidosis 2.1 BUN/creatinine 32/1.5 magnesium  1.5 WBC 10.6 troponin 18 Urine turbid moderate leukocytes negative nitrites many bacteria  In ED treatment was initiated ceftriaxone  2 g boluses of IV fluid and has been placed on LR 150 continuously 4 g of magnesium  was given given RVR    Pertinent imaging/studies till date  CXR cardiomegaly low lung volume CT ABD pelvis bilaterally proper position and a ureteric stent small amount of air proximal right ureter and intrarenal calyx right kidney, 10 mm nonobstructing renal calculus right kidney, mild bilateral hydronephrosis mild bilateral Perry pelvic inflammatory fat  stranding?  Postprocedural?  Acute pyelo- Steatosis diverticulosis   Assessment  & Plan :    Sepsis on admission secondary to pyelonephritis in the setting of nonobstructive stone with plans for definitive surgery 12/5 Given she has had ESBL at the last hospital stay, I will switch her ceftriaxone  to ertapenem   It appears no urine or blood cultures were obtained I will obtain a stat urine culture asked for blood culture X2 Copious IVF 150 cc/H for now Case discussed with Dr. Rudolpho who called me this morning-he thinks that if she is stable from A-fib perspective can transfer to [facility] and likely have definitive surgery 12/5 under Dr. Selma Pain control Oxy IR 5 every 6 as needed hold Toradol  Can continue oxybutynin  5 every 8 as needed for spasm  A-fib RVR on admission CHADVASC >4 previously on Xarelto  Had RVR because she missed her medications at home apparently Xarelto  held for possible procedure Rate controlled on Toprol -XL 25 mg and we will continue Resume Xarelto  once all procedures are performed  Diabetes mellitus type 2 Well-controlled previously with A1c below 7 in the past several months--tells me was never told diabetic Appears to be diet controlled at this time so will not initiate meds if CBGs are above 180 we will discuss sliding scale coverage  Hypomagnesemia 4 g magnesium  given IV repeat lab pending  Previous EtOH Probable OSA with BMI of 52 super morbid obesity---OP testing and referrals once all acute issue sover  Data Reviewed today:  Sodium 138 potassium 3.8 BUN/creatinine 32/1.6 WBC 9.3 hemoglobin 11.3 platelet 207 INR 1.1 a.m. cortisol 4.4   DVT prophylaxis: Left Xarelto  washout---  start SCDs  Status is: Inpatient Transfer to [facility]    Dispo/Global plan: Await definitive surgery   Time 60   Subjective:   Awake pleasant some mild R sided pain no fever no chills--compliant on all Afib meds---no dysuira     Objective + exam Vitals:    01/13/24 2334 01/14/24 0000 01/14/24 0505 01/14/24 0905  BP: 122/79  136/87 103/71  Pulse: 89  86 90  Resp: 20  19 19   Temp: 97.9 F (36.6 C)  97.6 F (36.4 C) 98.4 F (36.9 C)  TempSrc:      SpO2: 100%  100% 99%  Weight:  127 kg    Height:  5' 1 (1.549 m)     Filed Weights   01/13/24 1638 01/14/24 0000  Weight: 90.7 kg 127 kg     Examination: Eomi ncat no focal deficit S1 S2 NSR noted on monitors Abdomen obese nontender no rebound no guarding CVA tenderness on right side ROM intact     Scheduled Meds:  atorvastatin   20 mg Oral Daily   famotidine   20 mg Oral BID   lidocaine   1 patch Transdermal Once   metoprolol  succinate  25 mg Oral Daily   Continuous Infusions:  ertapenem      lactated ringers  150 mL/hr (01/14/24 0814)   acetaminophen  **OR** acetaminophen , albuterol , docusate sodium , LORazepam , magnesium  hydroxide, menthol , [DISCONTINUED] ondansetron  **OR** ondansetron  (ZOFRAN ) IV, ondansetron , oxybutynin , oxyCODONE , traZODone  Jai-Gurmukh Tori Cupps, MD  [facility]

## 2024-01-14 NOTE — Progress Notes (Addendum)
 Subjective: No acute events overnight.  Patient up in chair.  Reviewed case and plan and she is amenable to transferring to [facility] and proceeding with her scheduled surgery.  Objective: Vital signs in last 24 hours: Temp:  [97.6 F (36.4 C)-98.4 F (36.9 C)] 98.4 F (36.9 C) (12/04 0905) Pulse Rate:  [69-166] 90 (12/04 0905) Resp:  [17-24] 19 (12/04 0905) BP: (67-136)/(47-105) 103/71 (12/04 0905) SpO2:  [93 %-100 %] 99 % (12/04 0905) Weight:  [90.7 kg-127 kg] 127 kg (12/04 0000)  Assessment/Plan: # Right ureteral calculi # Left renal calculi # pyelonephritis  Afebrile, WBC WNL, hemodynamically stable  Looks well on rounds and remains in normal sinus rhythm.  Plans to transfer to [facility] and keep her scheduled appointment for cystoscopy and ureteroscopy  Intake/Output from previous day: 12/03 0701 - 12/04 0700 In: 616.5 [P.O.:474; I.V.:142.5] Out: -   Intake/Output this shift: No intake/output data recorded.  Physical Exam:  General: Alert and oriented CV: No cyanosis Lungs: equal chest rise   Lab Results: Recent Labs    01/13/24 1727 01/14/24 0450  HGB 12.6 11.3*  HCT 39.3 35.6*   BMET Recent Labs    01/13/24 1727 01/14/24 0450  NA 139 138  K 3.8 3.8  CL 104 106  CO2 24 27  GLUCOSE 118* 135*  BUN 32* 32*  CREATININE 1.56* 1.61*  CALCIUM  8.6* 8.2*  HGB 12.6 11.3*  WBC 10.6* 9.3     Studies/Results: CT ABDOMEN PELVIS W CONTRAST Result Date: 01/13/2024 CLINICAL DATA:  Suspected pyelonephritis. EXAM: CT ABDOMEN AND PELVIS WITH CONTRAST TECHNIQUE: Multidetector CT imaging of the abdomen and pelvis was performed using the standard protocol following bolus administration of intravenous contrast. RADIATION DOSE REDUCTION: This exam was performed according to the departmental dose-optimization program which includes automated exposure control, adjustment of the mA and/or kV according to patient size and/or use of iterative reconstruction  technique. CONTRAST:  75mL OMNIPAQUE  IOHEXOL  350 MG/ML SOLN COMPARISON:  None Available. FINDINGS: Lower chest: No acute abnormality. Hepatobiliary: There is diffuse fatty infiltration of the liver parenchyma. No focal liver abnormality is seen. No gallstones, gallbladder wall thickening, or biliary dilatation. Pancreas: Unremarkable. No pancreatic ductal dilatation or surrounding inflammatory changes. Spleen: Normal in size without focal abnormality. Adrenals/Urinary Tract: Adrenal glands are unremarkable. Kidneys are normal in size. Bilateral properly positioned endo ureteral stents are in place with a small amount of air seen within the proximal right ureter and intrarenal calyx of the mid right kidney. A 10 mm nonobstructing renal calculus is seen within the mid right kidney. Very mild bilateral hydronephrosis is seen with mild bilateral peripelvic inflammatory fat stranding noted. Bladder is unremarkable. Stomach/Bowel: Stomach is within normal limits. Appendix appears normal. No evidence of bowel wall thickening, distention, or inflammatory changes. Numerous noninflamed diverticula are seen throughout the large bowel. Vascular/Lymphatic: Aortic atherosclerosis. No enlarged abdominal or pelvic lymph nodes. Reproductive: Status post hysterectomy. No adnexal masses. Other: No abdominal wall hernia or abnormality. No abdominopelvic ascites. Musculoskeletal: Multilevel degenerative changes are seen throughout the lumbar spine. IMPRESSION: 1. Bilateral properly positioned endo ureteral stents with a small amount of air seen within the proximal right ureter and intrarenal calyx of the mid right kidney. 2. 10 mm nonobstructing renal calculus within the mid right kidney. 3. Very mild bilateral hydronephrosis with mild bilateral peripelvic inflammatory fat stranding. While this may be postprocedural in origin, sequelae associated with acute pyelonephritis cannot be excluded. 4. Colonic diverticulosis. 5. Hepatic  steatosis. 6. Aortic  atherosclerosis. Electronically Signed   By: Suzen Dials M.D.   On: 01/13/2024 21:32   DG Chest 2 View Result Date: 01/13/2024 EXAM: 2 VIEW(S) XRAY OF THE CHEST 01/13/2024 05:56:00 PM COMPARISON: 04/29/2023 CLINICAL HISTORY: SHOB, palpitations FINDINGS: LUNGS AND PLEURA: Under penetrated radiograph. Low lung volumes. No focal pulmonary opacity. No pleural effusion. No pneumothorax. HEART AND MEDIASTINUM: Cardiomegaly. BONES AND SOFT TISSUES: No acute osseous abnormality. IMPRESSION: 1. Cardiomegaly. 2. Low lung volumes. Electronically signed by: Norman Gatlin MD 01/13/2024 07:06 PM EST RP Workstation: [id number]      LOS: 1 day   Ole Bourdon, NP [facility] Pager: [phone]  01/14/2024, 10:38 AM   I have seen and examined the patient and agree with the above assessment and plan.  Reviewed findings of hospitalization thus far.  She has known bilateral urolithiasis and is scheduled on 01/15/2024 for definitive treatment of her stones.  Unfortunately, she was found to be in A-fib with RVR yesterday and fortunately that is now controlled.  She was afebrile with no leukocytosis and stable creatinine.  CT revealed appropriate position stents bilaterally as well as 10 mm stone in her right kidney.  Discussed with patient that we will proceed with operating tomorrow as scheduled for cystoscopy, bilateral stent exchange, possible bilateral ureteroscopy with definitive treatment of stones.  Discussed that if it appears that her urine is infected, we will abort the case and only proceed with stent exchange.  Discussed risks and benefits including risk of infection, bleeding, sepsis, ureteral injury.  Matt R. Fuad Forget MD [facility]  Pager: [phone]

## 2024-01-14 NOTE — Assessment & Plan Note (Signed)
-   This could be contributing to her A-fib with RVR. - Will replace magnesium  level.

## 2024-01-14 NOTE — Progress Notes (Signed)
 Arranged transport via [facility] for  patient to arrive to short stay @ 0515 for 0730 surgery time tomorrow .  `

## 2024-01-14 NOTE — TOC Initial Note (Addendum)
 Transition of Care [facility]) - Initial/Assessment Note    Patient Details  Name: [name] MRN: [id number] Date of Birth: 18-May-1960  [facility] CM/SW Contact:    Lendia Dais, LCSWA Phone Number: 01/14/2024, 2:04 PM  Clinical Narrative: CSW spoke to pt at bedside and introduced self and role.  Pt is from home w/ husband [name]. Pt is independent w/ ADL's and drives. Pt stated that [name] has a new number and gave to CSW to update in chart. Pt contact updated and pt gave permission to CSW to contact spouse with updates.  Pt has no hx of HH and receives in come through part time employment. Pt reports no SDOH concerns. Pt stated her insurance will expire soon and inquired about medicaid eligibility. CSW sent a secure chat to financial navigator and stated they would reach out to pt tomorrow morning.  Pt report MH dx of anxiety and declined MH resource. Pt declines hx of SU.  Pt is due to transfer to [facility]. CSW will monitor for transfer.             Expected Discharge Plan: Acute to Acute Transfer Barriers to Discharge: Continued Medical Work up   Patient Goals and CMS Choice Patient states their goals for this hospitalization and ongoing recovery are:: Eat healthier and excercise more.   Choice offered to / list presented to : NA      Expected Discharge Plan and Services In-house Referral: Clinical Social Work     Living arrangements for the past 2 months: Single Family Home                                      Prior Living Arrangements/Services Living arrangements for the past 2 months: Single Family Home Lives with:: Spouse Patient language and need for interpreter reviewed:: Yes        Need for Family Participation in Patient Care: No (Comment) Care giver support system in place?: Yes (comment) Current home services: DME Criminal Activity/Legal Involvement Pertinent to Current Situation/Hospitalization: No - Comment as needed  Activities of  Daily Living   ADL Screening (condition at time of admission) Independently performs ADLs?: Yes (appropriate for developmental age) Is the patient deaf or have difficulty hearing?: No Does the patient have difficulty seeing, even when wearing glasses/contacts?: No Does the patient have difficulty concentrating, remembering, or making decisions?: No  Permission Sought/Granted   Permission granted to share information with : Yes, Verbal Permission Granted  Share Information with NAME: Idali Lafever     Permission granted to share info w Relationship: Spouse  Permission granted to share info w Contact Information: [phone]  Emotional Assessment Appearance:: Appears stated age Attitude/Demeanor/Rapport: Engaged Affect (typically observed): Appropriate, Pleasant Orientation: : Oriented to Situation, Oriented to Self, Oriented to Place, Oriented to  Time Alcohol / Substance Use: Not Applicable Psych Involvement: No (comment)  Admission diagnosis:  Pyelonephritis [N12] Patient Active Problem List   Diagnosis Date Noted   Hypomagnesemia 01/14/2024   Pyelonephritis 01/13/2024   Sepsis due to gram-negative UTI (HCC) 01/13/2024   Dyslipidemia 01/13/2024   Paroxysmal atrial fibrillation with RVR (HCC) 01/13/2024   Infection due to ESBL-producing Klebsiella pneumoniae 11/30/2023   Urinary tract infection without hematuria 11/18/2023   Kidney stone 11/18/2023   Sepsis secondary to UTI (HCC) 10/04/2023   Hypercoagulable state due to paroxysmal atrial fibrillation (HCC) 12/19/2022   Acute unilateral obstructive uropathy  08/14/2022   Chronic kidney disease, stage 3a (HCC) 08/14/2022   Alcohol withdrawal with perceptual disturbances (HCC) 05/24/2022   Nausea & vomiting 05/24/2022   Abdominal pain 05/24/2022   History of asthma 05/24/2022   Elevated AST (SGOT) 05/24/2022   Prediabetes 05/24/2022   Asthma, moderate persistent, poorly-controlled 11/13/2021   Cough 11/06/2021   Wheezing  11/06/2021   Chronic anticoagulation 11/06/2021   Bacteremia 09/06/2021   AKI (acute kidney injury) 09/06/2021   Right ureteral stone 09/06/2021   Hydronephrosis of right kidney 09/06/2021   Acute pyelonephritis 09/06/2021   Alcohol dependence with withdrawal delirium (HCC) 09/02/2021   Vitamin D  deficiency 03/13/2021   Microhematuria 03/13/2021   Tear of distal tendon of biceps 11/16/2020   Positive blood cultures    Asthma exacerbation 07/22/2020   Hyperkalemia 07/22/2020   Paroxysmal atrial fibrillation (HCC) 04/01/2020   Atrial flutter with rapid ventricular response (HCC) 04/01/2020   Alcohol dependence with withdrawal (HCC) 12/19/2019   History of pulmonary embolism 12/19/2019   Pneumonia due to COVID-19 virus 02/10/2019   Pulmonary embolism (HCC) 07/06/2018   Leukocytosis 09/22/2017   Rash 09/22/2017   Sepsis (HCC) 09/12/2016   Colovesical fistula 09/12/2016   Alcohol abuse 07/28/2016   Colonic diverticulum 07/28/2016   Hyperlipidemia 07/10/2016   Mass of left side of neck 07/10/2016   Head and neck lymphadenopathy 07/10/2016   Eustachian tube disorder 01/25/2016   Peripheral edema 03/16/2015   Asthma 03/16/2015   Right shoulder pain 03/16/2015   Hypokalemia 10/04/2014   Upper airway cough syndrome 10/03/2014   Morbid obesity with BMI of 50.0-59.9, adult (HCC) 10/03/2014   Lower back pain 05/25/2014   Bilateral shoulder pain 05/25/2014   Allergic rhinitis 12/06/2013   Impaired glucose tolerance 12/06/2013   Expected blood loss anemia 12/16/2012   Morbid obesity (HCC) 12/15/2012   S/P left TKA 12/14/2012   Goiter 11/26/2012   Preop exam for internal medicine 11/26/2012   Vaginal bleeding 04/30/2011   Colon polyps 02/10/2011   Eczema 02/10/2011   Depression 02/10/2011   Encounter for well adult exam with abnormal findings 09/27/2010   Anxiety state 04/05/2010   DIVERTICULITIS, HX OF 04/05/2010   PALPITATIONS, HX OF 09/14/2007   Essential hypertension  04/24/2007   VOCAL CORD DISORDER 04/24/2007   Extrinsic asthma 04/24/2007   GERD 04/24/2007   PCP:  Norleen Lynwood ORN, MD Pharmacy:   [facility] [phone] - [place], [place] - [address] AT [facility] [address] [place] [address] Phone: [phone] Fax: [phone]     Social Drivers of Health (SDOH) Social History: SDOH Screenings   Food Insecurity: No Food Insecurity (01/14/2024)  Housing: Low Risk  (01/14/2024)  Transportation Needs: No Transportation Needs (01/14/2024)  Utilities: Not At Risk (01/14/2024)  Depression (PHQ2-9): Low Risk  (11/20/2023)  Financial Resource Strain: Medium Risk (11/07/2021)  Tobacco Use: Medium Risk (01/14/2024)   SDOH Interventions:     Readmission Risk Interventions    11/19/2023   10:52 AM 11/17/2023    1:00 PM 10/06/2023    3:11 PM  Readmission Risk Prevention Plan  Transportation Screening Complete Complete Complete  PCP or Specialist Appt within 5-7 Days  Complete   PCP or Specialist Appt within 3-5 Days Complete  Complete  Home Care Screening  Complete   Medication Review (RN CM)  Referral to Pharmacy   HRI or Home Care Consult Complete  Complete  Social Work Consult for Recovery Care Planning/Counseling Complete  Complete  Palliative Care Screening  Not Applicable  Not Applicable  Medication Review (RN Care Manager) Referral to Pharmacy  Referral to Pharmacy

## 2024-01-14 NOTE — Progress Notes (Signed)
 NEW ADMISSION NOTE New Admission Note:    Arrival Method: ED stretcher Mental Orientation: AAOx4 Telemetry: 5M11 Assessment: Completed Skin: See flowsheet IV: RAC Pain: 0/10 Tubes: n/a Safety Measures: Safety Fall Prevention Plan has been given, discussed and signed Admission: Completed [facility] Orientation: Patient has been orientated to the room, unit and staff.  Family: none at bedside   Orders have been reviewed and implemented. Will continue to monitor the patient. Call light has been placed within reach and bed alarm has been activated.

## 2024-01-15 ENCOUNTER — Encounter (HOSPITAL_COMMUNITY): Payer: Self-pay | Admitting: Anesthesiology

## 2024-01-15 ENCOUNTER — Encounter (HOSPITAL_COMMUNITY): Admission: RE | Disposition: A | Payer: Self-pay | Source: Home / Self Care | Attending: Family Medicine

## 2024-01-15 ENCOUNTER — Inpatient Hospital Stay (HOSPITAL_COMMUNITY)

## 2024-01-15 ENCOUNTER — Ambulatory Visit (HOSPITAL_COMMUNITY): Admission: RE | Admit: 2024-01-15 | Source: Ambulatory Visit | Admitting: Urology

## 2024-01-15 ENCOUNTER — Other Ambulatory Visit: Payer: Self-pay

## 2024-01-15 ENCOUNTER — Inpatient Hospital Stay (HOSPITAL_COMMUNITY): Payer: Self-pay | Admitting: Physician Assistant

## 2024-01-15 ENCOUNTER — Encounter (HOSPITAL_COMMUNITY): Payer: Self-pay | Admitting: Family Medicine

## 2024-01-15 DIAGNOSIS — I471 Supraventricular tachycardia, unspecified: Secondary | ICD-10-CM | POA: Insufficient documentation

## 2024-01-15 DIAGNOSIS — N1831 Chronic kidney disease, stage 3a: Secondary | ICD-10-CM

## 2024-01-15 DIAGNOSIS — I480 Paroxysmal atrial fibrillation: Secondary | ICD-10-CM | POA: Diagnosis not present

## 2024-01-15 DIAGNOSIS — N179 Acute kidney failure, unspecified: Secondary | ICD-10-CM

## 2024-01-15 DIAGNOSIS — R7989 Other specified abnormal findings of blood chemistry: Secondary | ICD-10-CM

## 2024-01-15 DIAGNOSIS — N183 Chronic kidney disease, stage 3 unspecified: Secondary | ICD-10-CM | POA: Insufficient documentation

## 2024-01-15 DIAGNOSIS — I700 Atherosclerosis of aorta: Secondary | ICD-10-CM | POA: Insufficient documentation

## 2024-01-15 DIAGNOSIS — K760 Fatty (change of) liver, not elsewhere classified: Secondary | ICD-10-CM | POA: Insufficient documentation

## 2024-01-15 DIAGNOSIS — A4150 Gram-negative sepsis, unspecified: Secondary | ICD-10-CM | POA: Diagnosis not present

## 2024-01-15 LAB — BASIC METABOLIC PANEL WITH GFR
Anion gap: 10 (ref 5–15)
BUN: 29 mg/dL — ABNORMAL HIGH (ref 8–23)
CO2: 26 mmol/L (ref 22–32)
Calcium: 9 mg/dL (ref 8.9–10.3)
Chloride: 104 mmol/L (ref 98–111)
Creatinine, Ser: 1.47 mg/dL — ABNORMAL HIGH (ref 0.44–1.00)
GFR, Estimated: 40 mL/min — ABNORMAL LOW (ref >60)
Glucose, Bld: 169 mg/dL — ABNORMAL HIGH (ref 70–99)
Potassium: 3.9 mmol/L (ref 3.5–5.1)
Sodium: 140 mmol/L (ref 135–145)

## 2024-01-15 LAB — ECHOCARDIOGRAM COMPLETE
AR max vel: 1.35 cm2
AV Area VTI: 1.49 cm2
AV Area mean vel: 1.35 cm2
AV Mean grad: 6 mmHg
AV Peak grad: 11.4 mmHg
Ao pk vel: 1.69 m/s
Area-P 1/2: 3.19 cm2
Calc EF: 54.9 %
Height: 61 in
MV VTI: 2.66 cm2
S' Lateral: 4.1 cm
Single Plane A2C EF: 56.7 %
Single Plane A4C EF: 53.3 %
Weight: 4478.41 [oz_av]

## 2024-01-15 LAB — MAGNESIUM: Magnesium: 2.1 mg/dL (ref 1.7–2.4)

## 2024-01-15 LAB — PHOSPHORUS: Phosphorus: 3.2 mg/dL (ref 2.5–4.6)

## 2024-01-15 SURGERY — CYSTOSCOPY/URETEROSCOPY/HOLMIUM LASER/STENT PLACEMENT
Anesthesia: General | Laterality: Bilateral

## 2024-01-15 MED ORDER — ORAL CARE MOUTH RINSE: 15.0000 mL | Freq: Once | OROMUCOSAL | Status: DC

## 2024-01-15 MED ORDER — METOPROLOL TARTRATE 25 MG PO TABS
12.5000 mg | ORAL_TABLET | Freq: Four times a day (QID) | ORAL | Status: DC
  Administered 2024-01-15 – 2024-01-17 (×8): 12.5 mg via ORAL
  Filled 2024-01-15 (×8): qty 1

## 2024-01-15 MED ORDER — PROPOFOL 10 MG/ML IV BOLUS
INTRAVENOUS | Status: AC
  Filled 2024-01-15: qty 20

## 2024-01-15 MED ORDER — LACTATED RINGERS IV SOLN: INTRAVENOUS | Status: DC

## 2024-01-15 MED ORDER — CEFAZOLIN SODIUM-DEXTROSE 3-4 GM/150ML-% IV SOLN
3.0000 g | INTRAVENOUS | Status: DC
  Filled 2024-01-15: qty 150

## 2024-01-15 MED ORDER — CHLORHEXIDINE GLUCONATE 0.12 % MT SOLN: 15.0000 mL | Freq: Once | OROMUCOSAL | Status: DC

## 2024-01-15 MED ORDER — LIDOCAINE HCL (PF) 2 % IJ SOLN
INTRAMUSCULAR | Status: AC
  Filled 2024-01-15: qty 20

## 2024-01-15 MED ORDER — FENTANYL CITRATE (PF) 100 MCG/2ML IJ SOLN
INTRAMUSCULAR | Status: AC
  Filled 2024-01-15: qty 2

## 2024-01-15 MED ORDER — ATORVASTATIN CALCIUM 40 MG PO TABS
40.0000 mg | ORAL_TABLET | Freq: Every day | ORAL | Status: DC
  Administered 2024-01-16 – 2024-01-18 (×3): 40 mg via ORAL
  Filled 2024-01-15 (×3): qty 1

## 2024-01-15 MED ORDER — METOPROLOL SUCCINATE ER 25 MG PO TB24
25.0000 mg | ORAL_TABLET | Freq: Once | ORAL | Status: DC
  Filled 2024-01-15: qty 1

## 2024-01-15 MED ORDER — LORAZEPAM 0.5 MG PO TABS
0.5000 mg | ORAL_TABLET | Freq: Four times a day (QID) | ORAL | Status: DC | PRN
  Administered 2024-01-15 – 2024-01-18 (×9): 0.5 mg via ORAL
  Filled 2024-01-15 (×9): qty 1

## 2024-01-15 MED ORDER — ONDANSETRON HCL 4 MG/2ML IJ SOLN
INTRAMUSCULAR | Status: AC
  Filled 2024-01-15: qty 8

## 2024-01-15 NOTE — Progress Notes (Signed)
 Progress Note   Patient: [name] FMW:[id number] DOB: [birth year] DOA: 01/13/2024     2 DOS: the patient was seen and examined on 01/15/2024   Brief hospital course: 63 year old woman PMH including PAF on Xarelto , urolithiasis.  Had bilateral ureteral stents placed October for obstruct during right ureteral stone and nonobstructing left renal calculi, scheduled for definitive surgery December 5.  Presented 12/3 with rapid heart rate, bilateral flank pain, dysuria.  Admitted for acute pyelonephritis, sepsis, urology consultation, PAF with rapid ventricular response.  Consultants Urology  Cardiology   Procedures/Events 12/3 admission for pyelo, afib RVR, plan for 12/5 urology intervention 12/4 transferred to [facility] per urology request 12/5 procedure canceled secondary to tachycardia  Assessment and Plan: Sepsis on admission secondary to pyelonephritis in the setting of nonobstructive stone with plans for definitive surgery 12/5 Mild bilateral hydronephrosis Sepsis appears resolved.  Continue empiric antibiotics, follow-up gram-negative rod urine culture. Plan was for definitive urologic intervention 12/5 under Dr. Selma but this was canceled secondary to tachycardia   PAF A-fib RVR reportedly on admission  Recurrent SVT NSVT Trivial troponin elevation Missed Toprol -XL prior to admission which may have been an inciting event but she has had multiple instances since. I do not see any documented A-fib. EKG on admission showed regular narrow complex tachycardia consistent with SVT.  Magnesium  was low on admission and borderline yesterday.   Xarelto  on hold for procedure.  Has been on Toprol -XL 25 mg as at home including dose this a.m. Resume Xarelto  once all procedures are performed Given recurrent SVT with multiple instances overnight, urology canceled procedure and requested cardiology consultation -- will ask cardiology to see Blood pressure stable, will give an extra dose of Toprol -XL  now Check her electrolytes now and replace as indicated   Diabetes mellitus type 2 Well-controlled with last 2 A1c below 7 Diet controlled, stable  AKI CKD stage IIIa Creatinine highly variable over the last year, baseline perhaps around 1-1.2 Repeat BMP today and give fluids.  Follow urine output.   Previous EtOH  Hepatic steatosis Aortic atherosclerosis  Class 3 obesity: BMI >=40 kg/m. Body mass index is 52.89 kg/m.     Subjective:  Feels good right now, no CP, SOB. Takes Toprol  and usually doesn't have any heart issues. Reports she sees cardiology yearly Was due for urology stone management today but procedure canceled secondary to tachycardia  Physical Exam: Vitals:   01/15/24 0539 01/15/24 0606 01/15/24 0615 01/15/24 0734  BP: (!) 107/58 100/69  110/63  Pulse: 100 92  (!) 154  Resp: 19 18  17   Temp: 98.1 F (36.7 C) 97.6 F (36.4 C)  97.6 F (36.4 C)  TempSrc:  Oral  Oral  SpO2: 99% 97%  100%  Weight:   127 kg   Height:   5' 1 (1.549 m)    Physical Exam Vitals reviewed.  Constitutional:      General: She is not in acute distress.    Appearance: She is not ill-appearing or toxic-appearing.     Comments: Sitting near window, appears calm and comfortable  Cardiovascular:     Rate and Rhythm: Normal rate and regular rhythm.     Heart sounds: No murmur heard.    Comments: Telemetry shows multiple episodes overnight of narrow complex tachycardia, appears to be SVT.  One 11 beat run of VT Musculoskeletal:     Right lower leg: No edema.     Left lower leg: No edema.  Neurological:     Mental Status:  She is alert.  Psychiatric:        Mood and Affect: Mood normal.        Behavior: Behavior normal.     Data Reviewed: No labs today, will check electrolytes, CBC EKG 12/5 shows SVT, independent review  Family Communication: Husband [name] at bedside  Disposition: Status is: Inpatient Remains inpatient appropriate because: SVT     Time spent: 35  minutes  Author: Toribio Door, MD 01/15/2024 8:51 AM  For on call review [email or web address].

## 2024-01-15 NOTE — Progress Notes (Signed)
 Report called to Dayshift RN - Harlene reddish floor. Notified regarding events on SS, (SVT and medication given)  Dr. Boone to the bedside prior to transport to evaluate, discussed that hospitalist will need to review patient and consult Cardiology if necessary.  Patient transported back tth floor with two RN and monitored on teley. Bedside report with Harlene on theth floor. Patient aware of POC and agreeable.

## 2024-01-15 NOTE — Hospital Course (Addendum)
 63 year old woman PMH including PAF on Xarelto , urolithiasis.  Had bilateral ureteral stents placed October for obstruct during right ureteral stone and nonobstructing left renal calculi, scheduled for definitive surgery December 5.  Presented 12/3 with rapid heart rate, bilateral flank pain, dysuria.  Admitted for acute pyelonephritis, sepsis, urology consultation, PAF with rapid ventricular response.  Consultants Urology  Cardiology   Procedures/Events 12/3 admission for pyelo, afib RVR, plan for 12/5 urology intervention 12/4 transferred to [facility] per urology request 12/5 procedure canceled secondary to tachycardia 12/8  1.  Cystoscopy 2. Bilateral ureteroscopy with laser lithotripsy and basket extraction of stones 3. Bilateral retrograde pyelogram 4. Bilateral ureteral stent placement 5. Fluoroscopy with intraoperative interpretation

## 2024-01-15 NOTE — Progress Notes (Signed)
 * Day of Surgery * Subjective: Denies pain, nausea emesis. Afebrile. Has remained on prophylactic antibiotics for the last month per ID recs. Tachycardic in the 150s-170s in pre-op.  Objective: Vital signs in last 24 hours: Temp:  [97.6 F (36.4 C)-98.4 F (36.9 C)] 97.6 F (36.4 C) (12/05 0606) Pulse Rate:  [77-100] 92 (12/05 0606) Resp:  [15-19] 18 (12/05 0606) BP: (100-129)/(55-78) 100/69 (12/05 0606) SpO2:  [95 %-100 %] 97 % (12/05 0606)  Intake/Output from previous day: 12/04 0701 - 12/05 0700 In: 2935.8 [P.O.:1040; I.V.:1795.8; IV Piggyback:100] Out: 0  Intake/Output this shift: Total I/O In: 242.5 [P.O.:240; I.V.:2.5] Out: -   Physical Exam:  General: Alert and oriented CV: RRR Lungs: Clear Abdomen: Soft, ND, NT Ext: NT, No erythema  Lab Results: Recent Labs    01/13/24 1727 01/14/24 0450  HGB 12.6 11.3*  HCT 39.3 35.6*   BMET Recent Labs    01/13/24 1727 01/14/24 0450  NA 139 138  K 3.8 3.8  CL 104 106  CO2 24 27  GLUCOSE 118* 135*  BUN 32* 32*  CREATININE 1.56* 1.61*  CALCIUM  8.6* 8.2*     Studies/Results: CT ABDOMEN PELVIS W CONTRAST Result Date: 01/13/2024 CLINICAL DATA:  Suspected pyelonephritis. EXAM: CT ABDOMEN AND PELVIS WITH CONTRAST TECHNIQUE: Multidetector CT imaging of the abdomen and pelvis was performed using the standard protocol following bolus administration of intravenous contrast. RADIATION DOSE REDUCTION: This exam was performed according to the departmental dose-optimization program which includes automated exposure control, adjustment of the mA and/or kV according to patient size and/or use of iterative reconstruction technique. CONTRAST:  75mL OMNIPAQUE  IOHEXOL  350 MG/ML SOLN COMPARISON:  None Available. FINDINGS: Lower chest: No acute abnormality. Hepatobiliary: There is diffuse fatty infiltration of the liver parenchyma. No focal liver abnormality is seen. No gallstones, gallbladder wall thickening, or biliary dilatation.  Pancreas: Unremarkable. No pancreatic ductal dilatation or surrounding inflammatory changes. Spleen: Normal in size without focal abnormality. Adrenals/Urinary Tract: Adrenal glands are unremarkable. Kidneys are normal in size. Bilateral properly positioned endo ureteral stents are in place with a small amount of air seen within the proximal right ureter and intrarenal calyx of the mid right kidney. A 10 mm nonobstructing renal calculus is seen within the mid right kidney. Very mild bilateral hydronephrosis is seen with mild bilateral peripelvic inflammatory fat stranding noted. Bladder is unremarkable. Stomach/Bowel: Stomach is within normal limits. Appendix appears normal. No evidence of bowel wall thickening, distention, or inflammatory changes. Numerous noninflamed diverticula are seen throughout the large bowel. Vascular/Lymphatic: Aortic atherosclerosis. No enlarged abdominal or pelvic lymph nodes. Reproductive: Status post hysterectomy. No adnexal masses. Other: No abdominal wall hernia or abnormality. No abdominopelvic ascites. Musculoskeletal: Multilevel degenerative changes are seen throughout the lumbar spine. IMPRESSION: 1. Bilateral properly positioned endo ureteral stents with a small amount of air seen within the proximal right ureter and intrarenal calyx of the mid right kidney. 2. 10 mm nonobstructing renal calculus within the mid right kidney. 3. Very mild bilateral hydronephrosis with mild bilateral peripelvic inflammatory fat stranding. While this may be postprocedural in origin, sequelae associated with acute pyelonephritis cannot be excluded. 4. Colonic diverticulosis. 5. Hepatic steatosis. 6. Aortic atherosclerosis. Electronically Signed   By: Suzen Dials M.D.   On: 01/13/2024 21:32   DG Chest 2 View Result Date: 01/13/2024 EXAM: 2 VIEW(S) XRAY OF THE CHEST 01/13/2024 05:56:00 PM COMPARISON: 04/29/2023 CLINICAL HISTORY: SHOB, palpitations FINDINGS: LUNGS AND PLEURA: Under penetrated  radiograph. Low lung volumes. No focal pulmonary opacity. No pleural  effusion. No pneumothorax. HEART AND MEDIASTINUM: Cardiomegaly. BONES AND SOFT TISSUES: No acute osseous abnormality. IMPRESSION: 1. Cardiomegaly. 2. Low lung volumes. Electronically signed by: Norman Gatlin MD 01/13/2024 07:06 PM EST RP Workstation: [id number]    Assessment/Plan: Bilateral ureteral/renal stones here for b/l URS/LL  -Reviewed case with anesthesia. Pt again in afib and tachycardic in the 150s-170s. Case cancelled. -Will need to be optimized by medicine/cardiology prior to this elective case. -Will discuss with my surgery scheduler about rescheduling.   LOS: 2 days   Matt R. Clint Biello MD 01/15/2024, 6:12 AM [facility]  Pager: [phone]

## 2024-01-15 NOTE — Progress Notes (Signed)
 Patient arrived to Short Stay in A. Fib.  Dr Leonce notified.  Ordered to give Daily metoprolol  dose po.

## 2024-01-15 NOTE — Progress Notes (Signed)
  Echocardiogram 2D Echocardiogram has been performed.  Norleen LELON Amour 01/15/2024, 2:31 PM

## 2024-01-15 NOTE — Progress Notes (Signed)
   01/15/24 0734  Assess: MEWS Score  Temp 97.6 F (36.4 C)  BP 110/63  MAP (mmHg) 76  Pulse Rate (!) 154  Resp 17  Level of Consciousness Alert  SpO2 100 %  O2 Device Room Air  Assess: MEWS Score  MEWS Temp 0  MEWS Systolic 0  MEWS Pulse 3  MEWS RR 0  MEWS LOC 0  MEWS Score 3  MEWS Score Color Yellow  Assess: if the MEWS score is Yellow or Red  Were vital signs accurate and taken at a resting state? Yes  Does the patient meet 2 or more of the SIRS criteria? No  MEWS guidelines implemented  Yes, yellow  Treat  MEWS Interventions Considered administering scheduled or prn medications/treatments as ordered  Take Vital Signs  Increase Vital Sign Frequency  Yellow: Q2hr x1, continue Q4hrs until patient remains green for 12hrs  Escalate  MEWS: Escalate Yellow: Discuss with charge nurse and consider notifying provider and/or RRT  Notify: Charge Nurse/RN  Name of Charge Nurse/RN Notified Lonell Alstrom, RN  Provider Notification  Provider Name/Title Dr. Jadine  Date Provider Notified 01/15/24  Time Provider Notified [phone]  Method of Notification Page  Notification Reason Change in status (SVT, OR case cancelled)  Assess: SIRS CRITERIA  SIRS Temperature  0  SIRS Respirations  0  SIRS Pulse 1  SIRS WBC 0  SIRS Score Sum  1

## 2024-01-15 NOTE — Consult Note (Signed)
 Cardiology Consultation   Patient ID: [name] MRN: [id number]; DOB: [birth year]  Admit date: 01/13/2024 Date of Consult: 01/15/2024  PCP:  [name] ORN, MD   [facility] Providers Cardiologist:  None        Patient Profile: [name] is a 63 y.o. female with a hx of alcohol abuse, asthma, obesity, hypertension, hyperlipidemia, paroxysmal atrial fibrillation on chronic anticoagulation, GERD, hx of ureter stenting 2/2 hydronephrosis due to nephrolithiasis c/b bacteremia who is being seen 01/15/2024 for the evaluation of SVT at the request of Susan Door MD.  History of Present Illness: [name] last seen by the AF clinic 12/2022 and was doing well, transitioned from eliquis  to xarelto  to improve chronic anticoagulation compliance.  Last echo was in 2022 which showed LVEF 50-55% with mild LVH. Mildly dilated atria. Small pericardial effusion.  Presented to the ED on 12/3 for palpitations, shortness of breath, and weakness.  In the ED BP: 111/79 ECG: Sinus tachycardia vs SVT HR 151  CXR low long volumes, cardiomegaly CT imaging showed bilateral ureteral stents and ongoing nonobstructive renal stones. Mild hydronephrosis and inflammatory changes post procedural vs pyelonephritis   Pertinent lab work: Cr ~1.6 Lactic acid 2.1 -> 1.9 Troponin 18 -> 18 UA showing evidence of UTI, urine culture grew gram - rods Low am cortisol  Has received IV fluids, IV antibiotics, and IV magnesium . Low dose lopressor . Xarelto  has been on hold for urologic procedure which was scheduled for today, now canceled 2/2 tachycardia.  ECG today: SVT HR 169   On interview, patient shared over the last 3-4 months she has noted palpitations and dizziness most notable at night. There is associated shortness of breath. Denied chest pain.  Denied DOE, angina, orthopnea, or PND. Denied peripheral edema prior to admission though notices it now.  Reports snoring, mouth breathing though  denied daytime sleepiness. Has never had a sleep study.    Past Medical History:  Diagnosis Date   Abscess of bladder 07/28/2016   Alcohol dependence (HCC)    Allergic rhinitis 12/06/2013   Anxiety    Aortic atherosclerosis 01/15/2024   Asthma 03/16/2015   Atrial flutter with rapid ventricular response (HCC) 04/01/2020   COLONIC POLYPS, HX OF 04/05/2010   COVID-19 09/2023   DIVERTICULITIS, HX OF 04/05/2010   DJD (degenerative joint disease)    right knee, mot to severe   Dysrhythmia    Fatty liver    GERD (gastroesophageal reflux disease)    no meds   Heart murmur    hx of    Hemorrhoids    Hepatic steatosis 01/15/2024   History of kidney stones    Hyperlipidemia    Hypertension    Impaired glucose tolerance 12/06/2013   Morbid obesity with BMI of 50.0-59.9, adult (HCC)    Nausea & vomiting 05/24/2022   Peripheral vascular disease    Pulmonary embolism (HCC)    SVT (supraventricular tachycardia) 01/15/2024    Past Surgical History:  Procedure Laterality Date   ABDOMINAL HYSTERECTOMY  age 64   fibroids   BREAST BIOPSY Left    COLONOSCOPY WITH PROPOFOL  N/A 07/25/2016   Procedure: COLONOSCOPY WITH PROPOFOL ;  Surgeon: Susan Dover, MD;  Location: [facility];  Service: Endoscopy;  Laterality: N/A;   colonscopy     x 2   CYSTOSCOPY W/ RETROGRADES Right 10/30/2023   Procedure: CYSTOSCOPY, WITH RETROGRADE PYELOGRAM;  Surgeon: Susan Susan SAUNDERS, MD;  Location: [facility];  Service: Urology;  Laterality: Right;   CYSTOSCOPY W/  URETERAL STENT PLACEMENT Right 09/05/2021   Procedure: CYSTOSCOPY WITH RETROGRADE PYELOGRAM/URETERAL STENT PLACEMENT;  Surgeon: Susan Susan SAUNDERS, MD;  Location: [facility];  Service: Urology;  Laterality: Right;   CYSTOSCOPY W/ URETERAL STENT PLACEMENT Right 10/04/2023   Procedure: CYSTOSCOPY, WITH RETROGRADE PYELOGRAM AND URETERAL STENT INSERTION;  Surgeon: Susan Steffan BROCKS, MD;  Location: [facility] OR;  Service: Urology;  Laterality: Right;   CYSTOSCOPY W/ URETERAL  STENT PLACEMENT Right 11/15/2023   Procedure: CYSTOSCOPY, WITH RETROGRADE PYELOGRAM AND URETERAL STENT INSERTION;  Surgeon: Susan Susan SAUNDERS, MD;  Location: [facility] OR;  Service: Urology;  Laterality: Right;   CYSTOSCOPY WITH RETROGRADE PYELOGRAM, URETEROSCOPY AND STENT PLACEMENT Left 08/14/2022   Procedure: CYSTOSCOPY WITH RETROGRADE PYELOGRAM;  Surgeon: Susan Susan SAUNDERS, MD;  Location: [facility] OR;  Service: Urology;  Laterality: Left;   CYSTOSCOPY WITH URETEROSCOPY AND STENT PLACEMENT Left 08/14/2022   Procedure: URETEROSCOPY AND STENT PLACEMENT;  Surgeon: Susan Susan SAUNDERS, MD;  Location: [facility] OR;  Service: Urology;  Laterality: Left;   CYSTOSCOPY/URETEROSCOPY/HOLMIUM LASER/STENT PLACEMENT Right 09/30/2021   Procedure: CYSTOSCOPY/ RETROGRADE/URETEROSCOPY/HOLMIUM LASER/STENT PLACEMENT;  Surgeon: Susan Susan SAUNDERS, MD;  Location: [facility];  Service: Urology;  Laterality: Right;   CYSTOSCOPY/URETEROSCOPY/HOLMIUM LASER/STENT PLACEMENT Left 09/12/2022   Procedure: CYSTOSCOPY/LEFT RETROGRADE PYELOGRAM/LEFT URETEROSCOPY/HOLMIUM LASER/LEFT STENT EXCHANGE;  Surgeon: Susan Susan SAUNDERS, MD;  Location: [facility];  Service: Urology;  Laterality: Left;  75 MINUTES NEEDED FOR CASE   CYSTOSCOPY/URETEROSCOPY/HOLMIUM LASER/STENT PLACEMENT Right 10/30/2023   Procedure: CYSTOSCOPY/URETEROSCOPY/HOLMIUM LASER/STENT PLACEMENT;  Surgeon: Susan Susan SAUNDERS, MD;  Location: [facility];  Service: Urology;  Laterality: Right;   IR RADIOLOGIST EVAL & MGMT  08/12/2016   IR RADIOLOGIST EVAL & MGMT  08/21/2016   KNEE ARTHROSCOPY     left    TOTAL KNEE ARTHROPLASTY  07/29/2011   Procedure: TOTAL KNEE ARTHROPLASTY;  Surgeon: Susan JONETTA Car, MD;  Location: [facility];  Service: Orthopedics;  Laterality: Right;   TOTAL KNEE ARTHROPLASTY Left 12/14/2012   Procedure: LEFT TOTAL KNEE ARTHROPLASTY;  Surgeon: Susan JONETTA Car, MD;  Location: [facility];  Service: Orthopedics;  Laterality: Left;       Scheduled Meds:  atorvastatin   20 mg Oral Daily   famotidine   20 mg Oral BID    lidocaine   1 patch Transdermal Once   metoprolol  succinate  25 mg Oral Daily   Continuous Infusions:  ertapenem  1 g (01/15/24 0935)   PRN Meds: acetaminophen  **OR** acetaminophen , albuterol , docusate sodium , LORazepam , magnesium  hydroxide, menthol , [DISCONTINUED] ondansetron  **OR** ondansetron  (ZOFRAN ) IV, ondansetron , oxybutynin , oxyCODONE , traZODone   Allergies:    Allergies  Allergen Reactions   Morphine  Itching   Shrimp [Shellfish Allergy] Itching and Other (See Comments)    Tongue burns also   Tramadol  Other (See Comments)    Caused confusion   Covid-19 (Mrna) Vaccine Hives   Diltiazem  Hcl Itching    Pt with itching of the feet when bolus given   Latex Rash    Social History:   Social History   Socioeconomic History   Marital status: Married    Spouse name: Not on file   Number of children: 1   Years of education: Not on file   Highest education level: Not on file  Occupational History   Occupation: book binder/preserver    Employer: [employer]  Tobacco Use   Smoking status: Former    Current packs/day: 0.00    Average packs/day: 0.5 packs/day for 7.0 years (3.5 ttl pk-yrs)    Types: Cigarettes    Start date: 02/11/1976  Quit date: 02/11/1983    Years since quitting: 40.9    Passive exposure: Never   Smokeless tobacco: Never  Vaping Use   Vaping status: Never Used  Substance and Sexual Activity   Alcohol use: Not Currently    Comment: occ   Drug use: Not Currently    Types: Marijuana    Comment: smoked marijuana x 10 years.  Quit in 1985.   Sexual activity: Yes    Birth control/protection: Surgical    Comment: 1st intercourse 63 yo-Fewer than 5 partners  Other Topics Concern   Not on file  Social History Narrative   Administrator, Civil Service on file.  [facility] April 05, 2010 12:04 PM   Social Drivers of Health   Financial Resource Strain: Medium Risk (11/07/2021)   Overall Financial Resource Strain (CARDIA)    Difficulty of Paying Living  Expenses: Somewhat hard  Food Insecurity: No Food Insecurity (01/14/2024)   Hunger Vital Sign    Worried About Running Out of Food in the Last Year: Never true    Ran Out of Food in the Last Year: Never true  Transportation Needs: No Transportation Needs (01/14/2024)   PRAPARE - Administrator, Civil Service (Medical): No    Lack of Transportation (Non-Medical): No  Physical Activity: Not on file  Stress: Not on file  Social Connections: Not on file  Intimate Partner Violence: Not At Risk (01/14/2024)   Humiliation, Afraid, Rape, and Kick questionnaire    Fear of Current or Ex-Partner: No    Emotionally Abused: No    Physically Abused: No    Sexually Abused: No    Family History:   Family History  Problem Relation Age of Onset   Stroke Mother    COPD Father    Lymphoma Sister    Alcoholism Sister      ROS:  Please see the history of present illness.  All other ROS reviewed and negative.     Physical Exam/Data: Vitals:   01/15/24 0606 01/15/24 0615 01/15/24 0734 01/15/24 0930  BP: 100/69  110/63 106/76  Pulse: 92  (!) 154 84  Resp: 18  17   Temp: 97.6 F (36.4 C)  97.6 F (36.4 C)   TempSrc: Oral  Oral   SpO2: 97%  100%   Weight:  127 kg    Height:  5' 1 (1.549 m)      Intake/Output Summary (Last 24 hours) at 01/15/2024 0946 Last data filed at 01/15/2024 0600 Gross per 24 hour  Intake 2535.77 ml  Output 0 ml  Net 2535.77 ml      01/15/2024    6:15 AM 01/14/2024   12:00 AM 01/13/2024    4:38 PM  Last 3 Weights  Weight (lbs) 279 lb 14.4 oz 279 lb 14.4 oz 200 lb  Weight (kg) 126.962 kg 126.962 kg 90.719 kg     Body mass index is 52.89 kg/m.  General:  Well nourished, well developed, in no acute distress HEENT: normal Cardiac:  normal S1, S2; RRR; no murmur  Lungs:  CTAB though diminished bilaterally Ext: trace pitting edema bilaterally Musculoskeletal:  No deformities Skin: warm and dry  Psych:  Normal affect   EKG:  The EKG was personally  reviewed and demonstrates:  See HPI Telemetry:  Telemetry was personally reviewed and demonstrates:  several episodes of SVT HR ~155, one episode of NSVT 13 beats, currently in NSR HR ~85  Relevant CV Studies: Echocardiogram 2022 IMPRESSIONS  1. Left ventricular ejection fraction, by estimation, is 50 to 55%. The  left ventricle has low normal function. The left ventricle has no regional  wall motion abnormalities. There is mild concentric left ventricular  hypertrophy. Left ventricular  diastolic parameters are indeterminate.   2. Right ventricular systolic function is normal. The right ventricular  size is normal. Tricuspid regurgitation signal is inadequate for assessing  PA pressure.   3. Left atrial size was mildly dilated.   4. Right atrial size was mildly dilated.   5. A small pericardial effusion is present. The pericardial effusion is  circumferential.   6. The mitral valve is normal in structure. No evidence of mitral valve  regurgitation. No evidence of mitral stenosis.   7. The aortic valve is grossly normal. Aortic valve regurgitation is not  visualized. No aortic stenosis is present.   8. The inferior vena cava is normal in size with <50% respiratory  variability, suggesting right atrial pressure of 8 mmHg.    Laboratory Data: High Sensitivity Troponin:   Recent Labs  Lab 01/13/24 1727 01/13/24 1930  TROPONINIHS 18* 18*     Chemistry Recent Labs  Lab 01/13/24 1727 01/14/24 0450  NA 139 138  K 3.8 3.8  CL 104 106  CO2 24 27  GLUCOSE 118* 135*  BUN 32* 32*  CREATININE 1.56* 1.61*  CALCIUM  8.6* 8.2*  MG 1.5* 1.8  GFRNONAA 37* 36*  ANIONGAP 11 5    Hematology Recent Labs  Lab 01/13/24 1727 01/14/24 0450  WBC 10.6* 9.3  RBC 4.23 3.82*  HGB 12.6 11.3*  HCT 39.3 35.6*  MCV 92.9 93.2  MCH 29.8 29.6  MCHC 32.1 31.7  RDW 14.3 14.3  PLT 231 207    Radiology/Studies:  CT ABDOMEN PELVIS W CONTRAST Result Date: 01/13/2024 CLINICAL DATA:   Suspected pyelonephritis. EXAM: CT ABDOMEN AND PELVIS WITH CONTRAST TECHNIQUE: Multidetector CT imaging of the abdomen and pelvis was performed using the standard protocol following bolus administration of intravenous contrast. RADIATION DOSE REDUCTION: This exam was performed according to the departmental dose-optimization program which includes automated exposure control, adjustment of the mA and/or kV according to patient size and/or use of iterative reconstruction technique. CONTRAST:  75mL OMNIPAQUE  IOHEXOL  350 MG/ML SOLN COMPARISON:  None Available. FINDINGS: Lower chest: No acute abnormality. Hepatobiliary: There is diffuse fatty infiltration of the liver parenchyma. No focal liver abnormality is seen. No gallstones, gallbladder wall thickening, or biliary dilatation. Pancreas: Unremarkable. No pancreatic ductal dilatation or surrounding inflammatory changes. Spleen: Normal in size without focal abnormality. Adrenals/Urinary Tract: Adrenal glands are unremarkable. Kidneys are normal in size. Bilateral properly positioned endo ureteral stents are in place with a small amount of air seen within the proximal right ureter and intrarenal calyx of the mid right kidney. A 10 mm nonobstructing renal calculus is seen within the mid right kidney. Very mild bilateral hydronephrosis is seen with mild bilateral peripelvic inflammatory fat stranding noted. Bladder is unremarkable. Stomach/Bowel: Stomach is within normal limits. Appendix appears normal. No evidence of bowel wall thickening, distention, or inflammatory changes. Numerous noninflamed diverticula are seen throughout the large bowel. Vascular/Lymphatic: Aortic atherosclerosis. No enlarged abdominal or pelvic lymph nodes. Reproductive: Status post hysterectomy. No adnexal masses. Other: No abdominal wall hernia or abnormality. No abdominopelvic ascites. Musculoskeletal: Multilevel degenerative changes are seen throughout the lumbar spine. IMPRESSION: 1. Bilateral  properly positioned endo ureteral stents with a small amount of air seen within the proximal right ureter and intrarenal calyx of the mid right kidney.  2. 10 mm nonobstructing renal calculus within the mid right kidney. 3. Very mild bilateral hydronephrosis with mild bilateral peripelvic inflammatory fat stranding. While this may be postprocedural in origin, sequelae associated with acute pyelonephritis cannot be excluded. 4. Colonic diverticulosis. 5. Hepatic steatosis. 6. Aortic atherosclerosis. Electronically Signed   By: Suzen Dials M.D.   On: 01/13/2024 21:32   DG Chest 2 View Result Date: 01/13/2024 EXAM: 2 VIEW(S) XRAY OF THE CHEST 01/13/2024 05:56:00 PM COMPARISON: 04/29/2023 CLINICAL HISTORY: SHOB, palpitations FINDINGS: LUNGS AND PLEURA: Under penetrated radiograph. Low lung volumes. No focal pulmonary opacity. No pleural effusion. No pneumothorax. HEART AND MEDIASTINUM: Cardiomegaly. BONES AND SOFT TISSUES: No acute osseous abnormality. IMPRESSION: 1. Cardiomegaly. 2. Low lung volumes. Electronically signed by: Norman Gatlin MD 01/13/2024 07:06 PM EST RP Workstation: [id number]     Assessment and Plan:  Supraventricular Tachycardia  Non-sustained VT-one episode Patient reported she has had multiple instances of symptomatic palpitations prior to admission and during admission. She is Currently admitted with urosepsis Telemetry shows multiple episodes of SVT. Currently in NSR.   K and Mag pending today. Keep K >4 and Mag > 2. Hypotension limits options. BP: 106/76 this morning  Would split metoprolol  succinate 25 mg to metoprolol  tartrate 12.5 mg q6hr.  If BP remains low and symptomatic SVT continues may need to consider amiodarone for short term use so patient can obtain urologic procedure   Pre-operative evaluation See above.  RCRI is 0 with a 0.4% perioperative risk of MACE. Patient denied anginal symptoms, no prior ischemic history. Does report chronic DOE that she  attributes to deconditioning.  Echocardiogram pending.   Atrial Fibrillation Currently in NSR Chad Vas score 3 Anticoagulation on hold with urologic procedure pending Echocardiogram pending  BB as above  Elevated Troponin Minimally elevated and flat, most likely demand ischemia in the setting of urosepsis with episodes of tachycardia as described above.   Hypertension BP: 106/76 PTA lisinopril-hydrochlorothiazide  on hold Metoprolol  as above  Hyperlipidemia 07/2023 LDL 113  HDL 47 Increase lipitor to 40 mg  Risk Assessment/Risk Scores:       CHA2DS2-VASc Score = 3   This indicates a 3.2% annual risk of stroke. The patient's score is based upon: CHF History: 0 HTN History: 1 Diabetes History: 0 Stroke History: 0 Vascular Disease History: 1 Age Score: 0 Gender Score: 1        For questions or updates, please contact [facility] Please consult [email or web address] for contact info under      Signed, Leontine LOISE Salen, PA-C  01/15/2024 9:46 AM

## 2024-01-16 DIAGNOSIS — N179 Acute kidney failure, unspecified: Secondary | ICD-10-CM | POA: Diagnosis not present

## 2024-01-16 DIAGNOSIS — A4150 Gram-negative sepsis, unspecified: Secondary | ICD-10-CM | POA: Diagnosis not present

## 2024-01-16 DIAGNOSIS — N12 Tubulo-interstitial nephritis, not specified as acute or chronic: Secondary | ICD-10-CM | POA: Diagnosis not present

## 2024-01-16 DIAGNOSIS — I480 Paroxysmal atrial fibrillation: Secondary | ICD-10-CM | POA: Diagnosis not present

## 2024-01-16 LAB — BASIC METABOLIC PANEL WITH GFR
Anion gap: 9 (ref 5–15)
BUN: 25 mg/dL — ABNORMAL HIGH (ref 8–23)
CO2: 25 mmol/L (ref 22–32)
Calcium: 8.4 mg/dL — ABNORMAL LOW (ref 8.9–10.3)
Chloride: 106 mmol/L (ref 98–111)
Creatinine, Ser: 1.15 mg/dL — ABNORMAL HIGH (ref 0.44–1.00)
GFR, Estimated: 54 mL/min — ABNORMAL LOW (ref >60)
Glucose, Bld: 115 mg/dL — ABNORMAL HIGH (ref 70–99)
Potassium: 4 mmol/L (ref 3.5–5.1)
Sodium: 140 mmol/L (ref 135–145)

## 2024-01-16 LAB — URINE CULTURE: Culture: >=100000 — AB

## 2024-01-16 LAB — PHOSPHORUS: Phosphorus: 3.5 mg/dL (ref 2.5–4.6)

## 2024-01-16 LAB — MAGNESIUM: Magnesium: 2 mg/dL (ref 1.7–2.4)

## 2024-01-16 MED ORDER — CEFADROXIL 500 MG PO CAPS
1000.0000 mg | ORAL_CAPSULE | Freq: Two times a day (BID) | ORAL | Status: DC
  Administered 2024-01-17 – 2024-01-18 (×3): 1000 mg via ORAL
  Filled 2024-01-16 (×4): qty 2

## 2024-01-16 NOTE — Progress Notes (Signed)
 Progress Note  Patient Name: [name] Date of Encounter: 01/16/2024  Primary Cardiologist: None   Subjective   No chest pain or sob. No palpitations.  Inpatient Medications    Scheduled Meds:  atorvastatin   40 mg Oral Daily   [START ON 01/17/2024] cefadroxil   1,000 mg Oral BID   famotidine   20 mg Oral BID   lidocaine   1 patch Transdermal Once   metoprolol  tartrate  12.5 mg Oral Q6H   Continuous Infusions:  PRN Meds: acetaminophen  **OR** acetaminophen , albuterol , docusate sodium , LORazepam , magnesium  hydroxide, menthol , [DISCONTINUED] ondansetron  **OR** ondansetron  (ZOFRAN ) IV, ondansetron , oxybutynin , oxyCODONE , traZODone    Vital Signs    Vitals:   01/16/24 0145 01/16/24 0146 01/16/24 0428 01/16/24 0634  BP: 127/86 127/86 (!) 115/59 (!) 115/59  Pulse: 78 78 90 90  Resp:   17   Temp:   98.8 F (37.1 C)   TempSrc:      SpO2: 99%  97%   Weight:      Height:        Intake/Output Summary (Last 24 hours) at 01/16/2024 1145 Last data filed at 01/15/2024 2232 Gross per 24 hour  Intake 499.36 ml  Output --  Net 499.36 ml   Filed Weights   01/13/24 1638 01/14/24 0000 01/15/24 0615  Weight: 90.7 kg 127 kg 127 kg    Telemetry    nsr - Personally Reviewed  ECG    none - Personally Reviewed  Physical Exam   GEN: Morbidly obese, No acute distress.   Neck: No JVD Cardiac: RRR, no murmurs, rubs, or gallops.  Respiratory: Clear to auscultation bilaterally. GI: Soft, nontender, non-distended  MS: No edema; No deformity. Neuro:  Nonfocal  Psych: Normal affect   Labs    Chemistry Recent Labs  Lab 01/14/24 0450 01/15/24 0940 01/16/24 0425  NA 138 140 140  K 3.8 3.9 4.0  CL 106 104 106  CO2 27 26 25   GLUCOSE 135* 169* 115*  BUN 32* 29* 25*  CREATININE 1.61* 1.47* 1.15*  CALCIUM  8.2* 9.0 8.4*  GFRNONAA 36* 40* 54*  ANIONGAP 5 10 9      Hematology Recent Labs  Lab 01/13/24 1727 01/14/24 0450  WBC 10.6* 9.3  RBC 4.23 3.82*  HGB 12.6 11.3*   HCT 39.3 35.6*  MCV 92.9 93.2  MCH 29.8 29.6  MCHC 32.1 31.7  RDW 14.3 14.3  PLT 231 207    Cardiac EnzymesNo results for input(s): TROPONINI in the last 168 hours. No results for input(s): TROPIPOC in the last 168 hours.   BNPNo results for input(s): BNP, PROBNP in the last 168 hours.   DDimer No results for input(s): DDIMER in the last 168 hours.   Radiology    ECHOCARDIOGRAM COMPLETE Result Date: 01/15/2024    ECHOCARDIOGRAM REPORT   Patient Name:   [name] Date of Exam: 01/15/2024 Medical Rec #:  [id number]       Height:       61.0 in Accession #:    [id number]      Weight:       279.9 lb Date of Birth:  1960/03/02       BSA:          2.179 m Patient Age:    63 years        BP:           109/76 mmHg Patient Gender: F               HR:  76 bpm. Exam Location:  Inpatient Procedure: 2D Echo (Both Spectral and Color Flow Doppler were utilized during            procedure). Indications:    Afib  History:        Patient has prior history of Echocardiogram examinations.                 Arrythmias:Atrial Fibrillation.  Sonographer:    Norleen Amour Referring Phys: [id number] LOGAN N LOCKWOOD IMPRESSIONS  1. Left ventricular ejection fraction, by estimation, is 45 to 50%. The left ventricle has mildly decreased function. The left ventricle demonstrates global hypokinesis. There is mild left ventricular hypertrophy. Left ventricular diastolic parameters are consistent with Grade I diastolic dysfunction (impaired relaxation). Elevated left atrial pressure.  2. Right ventricular systolic function is normal. The right ventricular size is normal.  3. Left atrial size was mildly dilated.  4. The mitral valve is normal in structure. No evidence of mitral valve regurgitation. No evidence of mitral stenosis.  5. The aortic valve has an indeterminant number of cusps. Aortic valve regurgitation is trivial. No aortic stenosis is present.  6. The inferior vena cava is normal in size with  greater than 50% respiratory variability, suggesting right atrial pressure of 3 mmHg. FINDINGS  Left Ventricle: Left ventricular ejection fraction, by estimation, is 45 to 50%. The left ventricle has mildly decreased function. The left ventricle demonstrates global hypokinesis. The left ventricular internal cavity size was normal in size. There is  mild left ventricular hypertrophy. Left ventricular diastolic parameters are consistent with Grade I diastolic dysfunction (impaired relaxation). Elevated left atrial pressure. Right Ventricle: The right ventricular size is normal. Right ventricular systolic function is normal. Left Atrium: Left atrial size was mildly dilated. Right Atrium: Right atrial size was normal in size. Pericardium: There is no evidence of pericardial effusion. Mitral Valve: The mitral valve is normal in structure. No evidence of mitral valve regurgitation. No evidence of mitral valve stenosis. MV peak gradient, 2.7 mmHg. The mean mitral valve gradient is 1.0 mmHg. Tricuspid Valve: The tricuspid valve is normal in structure. Tricuspid valve regurgitation is trivial. No evidence of tricuspid stenosis. Aortic Valve: The aortic valve has an indeterminant number of cusps. Aortic valve regurgitation is trivial. No aortic stenosis is present. Aortic valve mean gradient measures 6.0 mmHg. Aortic valve peak gradient measures 11.4 mmHg. Pulmonic Valve: The pulmonic valve was normal in structure. Pulmonic valve regurgitation is trivial. No evidence of pulmonic stenosis. Aorta: The aortic root is normal in size and structure. Venous: The inferior vena cava is normal in size with greater than 50% respiratory variability, suggesting right atrial pressure of 3 mmHg. IAS/Shunts: No atrial level shunt detected by color flow Doppler.  LEFT VENTRICLE PLAX 2D LVIDd:         5.70 cm      Diastology LVIDs:         4.10 cm      LV e' medial:    3.81 cm/s LV PW:         0.90 cm      LV E/e' medial:  17.2 LV IVS:         1.00 cm      LV e' lateral:   4.90 cm/s LVOT diam:     2.10 cm      LV E/e' lateral: 13.4 LV SV:         48 LV SV Index:   22 LVOT Area:     3.46  cm  LV Volumes (MOD) LV vol d, MOD A2C: 97.6 ml LV vol d, MOD A4C: 131.0 ml LV vol s, MOD A2C: 42.3 ml LV vol s, MOD A4C: 61.2 ml LV SV MOD A2C:     55.3 ml LV SV MOD A4C:     131.0 ml LV SV MOD BP:      62.3 ml RIGHT VENTRICLE             IVC RV Basal diam:  3.70 cm     IVC diam: 1.80 cm RV S prime:     12.70 cm/s TAPSE (M-mode): 2.7 cm      PULMONARY VEINS                             Diastolic Velocity: 42.80 cm/s                             S/D Velocity:       0.80                             Systolic Velocity:  33.10 cm/s LEFT ATRIUM             Index        RIGHT ATRIUM           Index LA diam:        3.10 cm 1.42 cm/m   RA Area:     20.60 cm LA Vol (A2C):   56.0 ml 25.70 ml/m  RA Volume:   60.20 ml  27.63 ml/m LA Vol (A4C):   74.3 ml 34.10 ml/m LA Biplane Vol: 72.0 ml 33.05 ml/m  AORTIC VALVE                     PULMONIC VALVE AV Area (Vmax):    1.35 cm      PV Vmax:       0.86 m/s AV Area (Vmean):   1.35 cm      PV Peak grad:  3.0 mmHg AV Area (VTI):     1.49 cm AV Vmax:           169.00 cm/s AV Vmean:          119.000 cm/s AV VTI:            0.325 m AV Peak Grad:      11.4 mmHg AV Mean Grad:      6.0 mmHg LVOT Vmax:         65.90 cm/s LVOT Vmean:        46.400 cm/s LVOT VTI:          0.140 m LVOT/AV VTI ratio: 0.43  AORTA Ao Root diam: 2.90 cm Ao Asc diam:  3.20 cm MITRAL VALVE MV Area (PHT): 3.19 cm    SHUNTS MV Area VTI:   2.66 cm    Systemic VTI:  0.14 m MV Peak grad:  2.7 mmHg    Systemic Diam: 2.10 cm MV Mean grad:  1.0 mmHg MV Vmax:       0.82 m/s MV Vmean:      52.2 cm/s MV E velocity: 65.60 cm/s MV A velocity: 78.80 cm/s MV E/A ratio:  0.83 [name] Shallow MD Electronically signed by [name] Shallow MD Signature Date/Time: 01/15/2024/2:40:27 PM    Final     Cardiac Studies  none  Patient Profile     63 y.o. female admitted with  palpitations and atrial flutter in the setting of the need for ureteral stent surgery.  Assessment & Plan    Atrial flutter - she is maintaining NSR. She will continue her beta blocker. If she has recurrent atrial arrhythmias, agree with adding amiodarone. Preop eval - she may proceed with her ureteral stent surgery on Monday.     For questions or updates, please contact [facility] Please consult [email or web address] for contact info under Cardiology/STEMI.      Signed, Danelle Birmingham, MD  01/16/2024, 11:45 AM

## 2024-01-16 NOTE — Progress Notes (Addendum)
 [name] was patients previous cardiologist, patient wanted this noted in chart.

## 2024-01-16 NOTE — Progress Notes (Signed)
  Progress Note   Patient: [name] FMW:[id number] DOB: 11/27/1960 DOA: 01/13/2024     3 DOS: the patient was seen and examined on 01/16/2024   Brief hospital course: 63 year old woman PMH including PAF on Xarelto , urolithiasis.  Had bilateral ureteral stents placed October for obstruct during right ureteral stone and nonobstructing left renal calculi, scheduled for definitive surgery December 5.  Presented 12/3 with rapid heart rate, bilateral flank pain, dysuria.  Admitted for acute pyelonephritis, sepsis, urology consultation, PAF with rapid ventricular response.  Consultants Urology  Cardiology   Procedures/Events 12/3 admission for pyelo, afib RVR, plan for 12/5 urology intervention 12/4 transferred to [facility] per urology request 12/5 procedure canceled secondary to tachycardia  Assessment and Plan: Sepsis on admission secondary to pyelonephritis in the setting of nonobstructive stone with plans for definitive surgery 12/5 Mild bilateral hydronephrosis Plan was for definitive urologic intervention 12/5 under Dr. Selma but this was canceled secondary to tachycardia Sepsis resolved.  Narrow antibiotics based on culture data. Cleared for procedure now   PAF A-fib RVR reportedly on admission  Recurrent SVT NSVT Trivial troponin elevation Missed Toprol -XL prior to admission which may have been an inciting event but she has had multiple instances since. I do not see any documented A-fib. EKG on admission showed regular narrow complex tachycardia consistent with SVT.  Magnesium  was low on admission and borderline yesterday.   Xarelto  on hold for procedure.  Resume Xarelto  once all procedures are performed No significant SVT.  Continue metoprolol .  Consolidate tomorrow if stable.   Diabetes mellitus type 2 Well-controlled with last two A1c below 7 Diet controlled, stable   AKI CKD stage IIIa Creatinine highly variable over the last year, baseline perhaps around 1-1.2 Appears to be  back at baseline   Previous EtOH   Hepatic steatosis Aortic atherosclerosis   Class 3 obesity: BMI >=40 kg/m. Body mass index is 52.89 kg/m.      Subjective:  Feels better  Physical Exam: Vitals:   01/16/24 0146 01/16/24 0428 01/16/24 0634 01/16/24 1243  BP: 127/86 (!) 115/59 (!) 115/59 108/69  Pulse: 78 90 90 89  Resp:  17  (!) 21  Temp:  98.8 F (37.1 C)  (!) 97.5 F (36.4 C)  TempSrc:    Oral  SpO2:  97%  97%  Weight:      Height:       Physical Exam Vitals reviewed.  Constitutional:      General: She is not in acute distress.    Appearance: She is not ill-appearing or toxic-appearing.  Cardiovascular:     Rate and Rhythm: Normal rate and regular rhythm.     Heart sounds: No murmur heard. Pulmonary:     Effort: Pulmonary effort is normal. No respiratory distress.     Breath sounds: No wheezing, rhonchi or rales.  Neurological:     Mental Status: She is alert.  Psychiatric:        Mood and Affect: Mood normal.        Behavior: Behavior normal.     Data Reviewed: Creatinine down to 1.15, BUN down to 25 2D echocardiogram noted  Family Communication: None  Disposition: Status is: Inpatient Remains inpatient appropriate because: SVT     Time spent: 20 minutes  Author: Toribio Door, MD 01/16/2024 2:40 PM  For on call review [email or web address].

## 2024-01-17 DIAGNOSIS — I480 Paroxysmal atrial fibrillation: Secondary | ICD-10-CM | POA: Diagnosis not present

## 2024-01-17 DIAGNOSIS — N390 Urinary tract infection, site not specified: Secondary | ICD-10-CM | POA: Diagnosis not present

## 2024-01-17 DIAGNOSIS — A4150 Gram-negative sepsis, unspecified: Secondary | ICD-10-CM | POA: Diagnosis not present

## 2024-01-17 DIAGNOSIS — I471 Supraventricular tachycardia, unspecified: Secondary | ICD-10-CM | POA: Diagnosis not present

## 2024-01-17 MED ORDER — METOPROLOL TARTRATE 25 MG PO TABS: 25.0000 mg | ORAL_TABLET | Freq: Four times a day (QID) | ORAL | Status: DC

## 2024-01-17 MED ORDER — METOPROLOL TARTRATE 25 MG PO TABS
25.0000 mg | ORAL_TABLET | Freq: Four times a day (QID) | ORAL | Status: DC
  Administered 2024-01-17 – 2024-01-18 (×3): 25 mg via ORAL
  Filled 2024-01-17 (×3): qty 1

## 2024-01-17 MED ORDER — METOPROLOL TARTRATE 25 MG PO TABS
25.0000 mg | ORAL_TABLET | Freq: Four times a day (QID) | ORAL | Status: AC
  Administered 2024-01-17: 25 mg via ORAL
  Filled 2024-01-17: qty 1

## 2024-01-17 NOTE — Plan of Care (Signed)
  Problem: Fluid Volume: Goal: Hemodynamic stability will improve Outcome: Progressing   Problem: Clinical Measurements: Goal: Diagnostic test results will improve Outcome: Progressing Goal: Signs and symptoms of infection will decrease Outcome: Progressing   Problem: Respiratory: Goal: Ability to maintain adequate ventilation will improve Outcome: Progressing   Problem: Health Behavior/Discharge Planning: Goal: Ability to manage health-related needs will improve Outcome: Progressing   Problem: Clinical Measurements: Goal: Ability to maintain clinical measurements within normal limits will improve Outcome: Progressing Goal: Will remain free from infection Outcome: Progressing Goal: Diagnostic test results will improve Outcome: Progressing Goal: Respiratory complications will improve Outcome: Progressing Goal: Cardiovascular complication will be avoided Outcome: Progressing   Problem: Activity: Goal: Risk for activity intolerance will decrease Outcome: Progressing   Problem: Nutrition: Goal: Adequate nutrition will be maintained Outcome: Progressing   Problem: Coping: Goal: Level of anxiety will decrease Outcome: Progressing   Problem: Pain Managment: Goal: General experience of comfort will improve and/or be controlled Outcome: Progressing   Problem: Safety: Goal: Ability to remain free from injury will improve Outcome: Progressing   Problem: Skin Integrity: Goal: Risk for impaired skin integrity will decrease Outcome: Progressing

## 2024-01-17 NOTE — Plan of Care (Signed)
  Problem: Clinical Measurements: Goal: Cardiovascular complication will be avoided Outcome: Progressing   Problem: Activity: Goal: Risk for activity intolerance will decrease Outcome: Progressing   Problem: Nutrition: Goal: Adequate nutrition will be maintained Outcome: Progressing   

## 2024-01-17 NOTE — Progress Notes (Signed)
 Progress Note  Patient Name: [name] Date of Encounter: 01/17/2024  Primary Cardiologist: None   Subjective    My heart did a little more racing last night.  Inpatient Medications    Scheduled Meds:  atorvastatin   40 mg Oral Daily   cefadroxil   1,000 mg Oral BID   famotidine   20 mg Oral BID   lidocaine   1 patch Transdermal Once   metoprolol  tartrate  25 mg Oral Q6H   Continuous Infusions:  PRN Meds: acetaminophen  **OR** acetaminophen , docusate sodium , LORazepam , magnesium  hydroxide, menthol , [DISCONTINUED] ondansetron  **OR** ondansetron  (ZOFRAN ) IV, ondansetron , oxybutynin , oxyCODONE , traZODone    Vital Signs    Vitals:   01/16/24 0634 01/16/24 1243 01/16/24 2015 01/17/24 0412  BP: (!) 115/59 108/69 100/67 113/70  Pulse: 90 89 92 87  Resp:  (!) 21 20 20   Temp:  (!) 97.5 F (36.4 C) 97.7 F (36.5 C) 97.7 F (36.5 C)  TempSrc:  Oral Oral Oral  SpO2:  97% 98% 100%  Weight:      Height:       No intake or output data in the 24 hours ending 01/17/24 0945 Filed Weights   01/13/24 1638 01/14/24 0000 01/15/24 0615  Weight: 90.7 kg 127 kg 127 kg    Telemetry    Nsr with episodes of SVT - Personally Reviewed  ECG    none - Personally Reviewed  Physical Exam   GEN: morbidly obese, No acute distress.   Neck: No JVD Cardiac: RRR, no murmurs, rubs, or gallops.  Respiratory: Clear to auscultation bilaterally. GI: Soft, nontender, non-distended  MS: No edema; No deformity. Neuro:  Nonfocal  Psych: Normal affect   Labs    Chemistry Recent Labs  Lab 01/14/24 0450 01/15/24 0940 01/16/24 0425  NA 138 140 140  K 3.8 3.9 4.0  CL 106 104 106  CO2 27 26 25   GLUCOSE 135* 169* 115*  BUN 32* 29* 25*  CREATININE 1.61* 1.47* 1.15*  CALCIUM  8.2* 9.0 8.4*  GFRNONAA 36* 40* 54*  ANIONGAP 5 10 9      Hematology Recent Labs  Lab 01/13/24 1727 01/14/24 0450  WBC 10.6* 9.3  RBC 4.23 3.82*  HGB 12.6 11.3*  HCT 39.3 35.6*  MCV 92.9 93.2  MCH 29.8  29.6  MCHC 32.1 31.7  RDW 14.3 14.3  PLT 231 207    Cardiac EnzymesNo results for input(s): TROPONINI in the last 168 hours. No results for input(s): TROPIPOC in the last 168 hours.   BNPNo results for input(s): BNP, PROBNP in the last 168 hours.   DDimer No results for input(s): DDIMER in the last 168 hours.   Radiology    ECHOCARDIOGRAM COMPLETE Result Date: 01/15/2024    ECHOCARDIOGRAM REPORT   Patient Name:   [name] Date of Exam: 01/15/2024 Medical Rec #:  [id number]       Height:       61.0 in Accession #:    [id number]      Weight:       279.9 lb Date of Birth:  August 30, 1960       BSA:          2.179 m Patient Age:    63 years        BP:           109/76 mmHg Patient Gender: F               HR:  76 bpm. Exam Location:  Inpatient Procedure: 2D Echo (Both Spectral and Color Flow Doppler were utilized during            procedure). Indications:    Afib  History:        Patient has prior history of Echocardiogram examinations.                 Arrythmias:Atrial Fibrillation.  Sonographer:    Norleen Amour Referring Phys: [id number] LOGAN N LOCKWOOD IMPRESSIONS  1. Left ventricular ejection fraction, by estimation, is 45 to 50%. The left ventricle has mildly decreased function. The left ventricle demonstrates global hypokinesis. There is mild left ventricular hypertrophy. Left ventricular diastolic parameters are consistent with Grade I diastolic dysfunction (impaired relaxation). Elevated left atrial pressure.  2. Right ventricular systolic function is normal. The right ventricular size is normal.  3. Left atrial size was mildly dilated.  4. The mitral valve is normal in structure. No evidence of mitral valve regurgitation. No evidence of mitral stenosis.  5. The aortic valve has an indeterminant number of cusps. Aortic valve regurgitation is trivial. No aortic stenosis is present.  6. The inferior vena cava is normal in size with greater than 50% respiratory variability,  suggesting right atrial pressure of 3 mmHg. FINDINGS  Left Ventricle: Left ventricular ejection fraction, by estimation, is 45 to 50%. The left ventricle has mildly decreased function. The left ventricle demonstrates global hypokinesis. The left ventricular internal cavity size was normal in size. There is  mild left ventricular hypertrophy. Left ventricular diastolic parameters are consistent with Grade I diastolic dysfunction (impaired relaxation). Elevated left atrial pressure. Right Ventricle: The right ventricular size is normal. Right ventricular systolic function is normal. Left Atrium: Left atrial size was mildly dilated. Right Atrium: Right atrial size was normal in size. Pericardium: There is no evidence of pericardial effusion. Mitral Valve: The mitral valve is normal in structure. No evidence of mitral valve regurgitation. No evidence of mitral valve stenosis. MV peak gradient, 2.7 mmHg. The mean mitral valve gradient is 1.0 mmHg. Tricuspid Valve: The tricuspid valve is normal in structure. Tricuspid valve regurgitation is trivial. No evidence of tricuspid stenosis. Aortic Valve: The aortic valve has an indeterminant number of cusps. Aortic valve regurgitation is trivial. No aortic stenosis is present. Aortic valve mean gradient measures 6.0 mmHg. Aortic valve peak gradient measures 11.4 mmHg. Pulmonic Valve: The pulmonic valve was normal in structure. Pulmonic valve regurgitation is trivial. No evidence of pulmonic stenosis. Aorta: The aortic root is normal in size and structure. Venous: The inferior vena cava is normal in size with greater than 50% respiratory variability, suggesting right atrial pressure of 3 mmHg. IAS/Shunts: No atrial level shunt detected by color flow Doppler.  LEFT VENTRICLE PLAX 2D LVIDd:         5.70 cm      Diastology LVIDs:         4.10 cm      LV e' medial:    3.81 cm/s LV PW:         0.90 cm      LV E/e' medial:  17.2 LV IVS:        1.00 cm      LV e' lateral:   4.90 cm/s  LVOT diam:     2.10 cm      LV E/e' lateral: 13.4 LV SV:         48 LV SV Index:   22 LVOT Area:     3.46  cm  LV Volumes (MOD) LV vol d, MOD A2C: 97.6 ml LV vol d, MOD A4C: 131.0 ml LV vol s, MOD A2C: 42.3 ml LV vol s, MOD A4C: 61.2 ml LV SV MOD A2C:     55.3 ml LV SV MOD A4C:     131.0 ml LV SV MOD BP:      62.3 ml RIGHT VENTRICLE             IVC RV Basal diam:  3.70 cm     IVC diam: 1.80 cm RV S prime:     12.70 cm/s TAPSE (M-mode): 2.7 cm      PULMONARY VEINS                             Diastolic Velocity: 42.80 cm/s                             S/D Velocity:       0.80                             Systolic Velocity:  33.10 cm/s LEFT ATRIUM             Index        RIGHT ATRIUM           Index LA diam:        3.10 cm 1.42 cm/m   RA Area:     20.60 cm LA Vol (A2C):   56.0 ml 25.70 ml/m  RA Volume:   60.20 ml  27.63 ml/m LA Vol (A4C):   74.3 ml 34.10 ml/m LA Biplane Vol: 72.0 ml 33.05 ml/m  AORTIC VALVE                     PULMONIC VALVE AV Area (Vmax):    1.35 cm      PV Vmax:       0.86 m/s AV Area (Vmean):   1.35 cm      PV Peak grad:  3.0 mmHg AV Area (VTI):     1.49 cm AV Vmax:           169.00 cm/s AV Vmean:          119.000 cm/s AV VTI:            0.325 m AV Peak Grad:      11.4 mmHg AV Mean Grad:      6.0 mmHg LVOT Vmax:         65.90 cm/s LVOT Vmean:        46.400 cm/s LVOT VTI:          0.140 m LVOT/AV VTI ratio: 0.43  AORTA Ao Root diam: 2.90 cm Ao Asc diam:  3.20 cm MITRAL VALVE MV Area (PHT): 3.19 cm    SHUNTS MV Area VTI:   2.66 cm    Systemic VTI:  0.14 m MV Peak grad:  2.7 mmHg    Systemic Diam: 2.10 cm MV Mean grad:  1.0 mmHg MV Vmax:       0.82 m/s MV Vmean:      52.2 cm/s MV E velocity: 65.60 cm/s MV A velocity: 78.80 cm/s MV E/A ratio:  0.83 [name] Shallow MD Electronically signed by [name] Shallow MD Signature Date/Time: 01/15/2024/2:40:27 PM    Final     Cardiac Studies  See above  Patient Profile     63 y.o. female admitted for GU procedure but cancelled due to  SVT  Assessment & Plan    SVT - I have uptitrated her beta blocker. She should tolerate ok.  Preop - she may proceed with urologic procedure. Low risk. Morbid obesity - this is her main problem. She may be too big for catheter ablation of SVT in the future unless she loses weight. [facility] will sign off.   The patient is not ready for discharge   from a cardiac standpoint. Medication Recommendations:  metoprolol  25 bid at discharge Other recommendations (labs, testing, etc):  none Follow up as an outpatient:  with Dr. Almetta in February for followup of SVT.  For questions or updates, please contact [facility] Please consult [email or web address] for contact info under Cardiology/STEMI.      Signed, Danelle Birmingham, MD  01/17/2024, 9:45 AM

## 2024-01-17 NOTE — Progress Notes (Signed)
 p Progress Note   Patient: [name] FMW:[id number] DOB: [birth year] DOA: 01/13/2024     4 DOS: the patient was seen and examined on 01/17/2024   Brief hospital course: 63 year old woman PMH including PAF on Xarelto , urolithiasis.  Had bilateral ureteral stents placed October for obstruct during right ureteral stone and nonobstructing left renal calculi, scheduled for definitive surgery December 5.  Presented 12/3 with rapid heart rate, bilateral flank pain, dysuria.  Admitted for acute pyelonephritis, sepsis, urology consultation, PAF with rapid ventricular response.  Consultants Urology  Cardiology   Procedures/Events 12/3 admission for pyelo, afib RVR, plan for 12/5 urology intervention 12/4 transferred to [facility] per urology request 12/5 procedure canceled secondary to tachycardia  Assessment and Plan: Sepsis on admission secondary to pyelonephritis in the setting of nonobstructive stone with plans for definitive surgery 12/5 Mild bilateral hydronephrosis Plan was for definitive urologic intervention 12/5 under Dr. Selma but this was canceled secondary to tachycardia Sepsis resolved.  Narrowed antibiotics based on culture data. Cleared for procedure per conversation with Dr. Waddell 12/7.  Beta-blocker uptitrated.   PAF A-fib RVR on admission ruled out Recurrent SVT NSVT Trivial troponin elevation Missed Toprol -XL prior to admission which may have been an inciting event but she has had multiple instances since. Xarelto  on hold for procedure.  Resume Xarelto  once all procedures are performed Metoprolol  uptitrated by cardiology.  Follow-up with Dr. Almetta for SVT.   Diabetes mellitus type 2 Well-controlled with last two A1c below 7 Diet controlled, stable   AKI CKD stage IIIa Creatinine highly variable over the last year, baseline perhaps around 1-1.2 Appears to be back at baseline   Previous EtOH   Hepatic steatosis Aortic atherosclerosis   Class 3 obesity: BMI >=40  kg/m. Body mass index is 52.89 kg/m.  Discussed with Dr. Nieves, he has communicated with Dr. Selma regarding possibility of proceeding with urologic procedure tomorrow.    Subjective:  Had a brief episode of fast heart rate yesterday but is feeling well today.  Hopeful to get urologic procedure done tomorrow.  Physical Exam: Vitals:   01/17/24 1100 01/17/24 1200 01/17/24 1255 01/17/24 1300  BP:      Pulse:   90   Resp: 17 (!) 22  18  Temp:      TempSrc:      SpO2:      Weight:      Height:       Physical Exam Vitals reviewed.  Constitutional:      General: She is not in acute distress.    Appearance: She is not ill-appearing or toxic-appearing.     Comments: Sitting by window  Cardiovascular:     Rate and Rhythm: Normal rate and regular rhythm.     Heart sounds: No murmur heard. Pulmonary:     Effort: Pulmonary effort is normal. No respiratory distress.     Breath sounds: No wheezing, rhonchi or rales.  Neurological:     Mental Status: She is alert.  Psychiatric:        Mood and Affect: Mood normal.        Behavior: Behavior normal.     Data Reviewed: No new data  Family Communication: None  Disposition: Status is: Inpatient Remains inpatient appropriate because: SVT, await urologic procedure     Time spent: 20 minutes  Author: Toribio Door, MD 01/17/2024 2:18 PM  For on call review [email or web address].

## 2024-01-17 NOTE — Progress Notes (Signed)
 Patient experienced transient tachycardia with physical activity, with heart rates reaching the 150s. Tachycardia was sustained during activity but the patient remained asymptomatic. Heart rate returned to baseline/normal limits at rest. Patient denies associated chest pain, palpitations, or shortness of breath.

## 2024-01-18 ENCOUNTER — Encounter (HOSPITAL_COMMUNITY): Admission: EM | Disposition: A | Payer: Self-pay | Source: Home / Self Care | Attending: Family Medicine

## 2024-01-18 ENCOUNTER — Encounter (HOSPITAL_COMMUNITY): Payer: Self-pay | Admitting: Family Medicine

## 2024-01-18 ENCOUNTER — Other Ambulatory Visit (HOSPITAL_COMMUNITY): Payer: Self-pay

## 2024-01-18 ENCOUNTER — Inpatient Hospital Stay (HOSPITAL_COMMUNITY): Payer: Self-pay | Admitting: Anesthesiology

## 2024-01-18 ENCOUNTER — Inpatient Hospital Stay (HOSPITAL_COMMUNITY)

## 2024-01-18 ENCOUNTER — Encounter (HOSPITAL_COMMUNITY): Payer: Self-pay | Admitting: Anesthesiology

## 2024-01-18 DIAGNOSIS — N200 Calculus of kidney: Secondary | ICD-10-CM | POA: Diagnosis not present

## 2024-01-18 DIAGNOSIS — A4150 Gram-negative sepsis, unspecified: Secondary | ICD-10-CM | POA: Diagnosis not present

## 2024-01-18 DIAGNOSIS — N12 Tubulo-interstitial nephritis, not specified as acute or chronic: Secondary | ICD-10-CM | POA: Diagnosis not present

## 2024-01-18 DIAGNOSIS — N179 Acute kidney failure, unspecified: Secondary | ICD-10-CM | POA: Diagnosis not present

## 2024-01-18 DIAGNOSIS — I471 Supraventricular tachycardia, unspecified: Secondary | ICD-10-CM | POA: Diagnosis not present

## 2024-01-18 DIAGNOSIS — Z6841 Body Mass Index (BMI) 40.0 and over, adult: Secondary | ICD-10-CM

## 2024-01-18 HISTORY — PX: CYSTOSCOPY/URETEROSCOPY/HOLMIUM LASER/STENT PLACEMENT: SHX6546

## 2024-01-18 LAB — TSH: TSH: 3.96 u[IU]/mL (ref 0.350–4.500)

## 2024-01-18 SURGERY — CYSTOSCOPY/URETEROSCOPY/HOLMIUM LASER/STENT PLACEMENT
Anesthesia: General | Site: Ureter | Laterality: Bilateral

## 2024-01-18 SURGERY — Surgical Case
Anesthesia: *Unknown

## 2024-01-18 MED ORDER — LACTATED RINGERS IV SOLN: INTRAVENOUS | Status: DC

## 2024-01-18 MED ORDER — ALBUTEROL SULFATE HFA 108 (90 BASE) MCG/ACT IN AERS
INHALATION_SPRAY | RESPIRATORY_TRACT | Status: AC
  Filled 2024-01-18: qty 6.7

## 2024-01-18 MED ORDER — PROPOFOL 10 MG/ML IV BOLUS
INTRAVENOUS | Status: AC
  Filled 2024-01-18: qty 20

## 2024-01-18 MED ORDER — OXYCODONE HCL 5 MG/5ML PO SOLN: 5.0000 mg | Freq: Once | ORAL | Status: DC | PRN

## 2024-01-18 MED ORDER — MIDAZOLAM HCL 5 MG/5ML IJ SOLN
INTRAMUSCULAR | Status: DC | PRN
  Administered 2024-01-18 (×2): .5 mg via INTRAVENOUS
  Administered 2024-01-18: 1 mg via INTRAVENOUS

## 2024-01-18 MED ORDER — FENTANYL CITRATE (PF) 100 MCG/2ML IJ SOLN
INTRAMUSCULAR | Status: DC | PRN
  Administered 2024-01-18 (×8): 25 ug via INTRAVENOUS

## 2024-01-18 MED ORDER — MIDAZOLAM HCL 2 MG/2ML IJ SOLN
INTRAMUSCULAR | Status: AC
  Filled 2024-01-18: qty 2

## 2024-01-18 MED ORDER — DEXAMETHASONE SOD PHOSPHATE PF 10 MG/ML IJ SOLN
INTRAMUSCULAR | Status: DC | PRN
  Administered 2024-01-18: 4 mg via INTRAVENOUS

## 2024-01-18 MED ORDER — CHLORHEXIDINE GLUCONATE 0.12 % MT SOLN
15.0000 mL | Freq: Once | OROMUCOSAL | Status: AC
  Administered 2024-01-18: 15 mL via OROMUCOSAL
  Filled 2024-01-18: qty 15

## 2024-01-18 MED ORDER — SODIUM CHLORIDE 0.9 % IR SOLN
Status: DC | PRN
  Administered 2024-01-18: 3000 mL

## 2024-01-18 MED ORDER — METOPROLOL SUCCINATE ER 25 MG PO TB24
25.0000 mg | ORAL_TABLET | Freq: Two times a day (BID) | ORAL | 0 refills | Status: AC
  Filled 2024-01-18: qty 60, 30d supply, fill #0

## 2024-01-18 MED ORDER — LIDOCAINE HCL (CARDIAC) PF 100 MG/5ML IV SOSY
PREFILLED_SYRINGE | INTRAVENOUS | Status: DC | PRN
  Administered 2024-01-18: 100 mg via INTRAVENOUS

## 2024-01-18 MED ORDER — ONDANSETRON HCL 4 MG/2ML IJ SOLN
INTRAMUSCULAR | Status: AC
  Filled 2024-01-18: qty 2

## 2024-01-18 MED ORDER — FENTANYL CITRATE (PF) 50 MCG/ML IJ SOSY: 25.0000 ug | PREFILLED_SYRINGE | INTRAMUSCULAR | Status: DC | PRN

## 2024-01-18 MED ORDER — PROPOFOL 10 MG/ML IV BOLUS
INTRAVENOUS | Status: DC | PRN
  Administered 2024-01-18: 160 mg via INTRAVENOUS

## 2024-01-18 MED ORDER — MEPERIDINE HCL 25 MG/ML IJ SOLN: 6.2500 mg | INTRAMUSCULAR | Status: DC | PRN

## 2024-01-18 MED ORDER — MIDAZOLAM HCL (PF) 2 MG/2ML IJ SOLN
1.0000 mg | Freq: Once | INTRAMUSCULAR | Status: AC
  Administered 2024-01-18: 1 mg via INTRAVENOUS
  Filled 2024-01-18: qty 2

## 2024-01-18 MED ORDER — OXYCODONE HCL 5 MG PO TABS: 5.0000 mg | ORAL_TABLET | Freq: Once | ORAL | Status: DC | PRN

## 2024-01-18 MED ORDER — 0.9 % SODIUM CHLORIDE (POUR BTL) OPTIME
TOPICAL | Status: DC | PRN
  Administered 2024-01-18: 1000 mL

## 2024-01-18 MED ORDER — CEFADROXIL 500 MG PO CAPS
1000.0000 mg | ORAL_CAPSULE | Freq: Two times a day (BID) | ORAL | 0 refills | Status: AC
  Filled 2024-01-18: qty 32, 8d supply, fill #0

## 2024-01-18 MED ORDER — ACETAMINOPHEN 500 MG PO TABS
1000.0000 mg | ORAL_TABLET | Freq: Once | ORAL | Status: DC
  Filled 2024-01-18: qty 2

## 2024-01-18 MED ORDER — OXYCODONE HCL 5 MG PO TABS
5.0000 mg | ORAL_TABLET | Freq: Four times a day (QID) | ORAL | 0 refills | Status: DC | PRN
  Filled 2024-01-18: qty 30, 8d supply, fill #0

## 2024-01-18 MED ORDER — ORAL CARE MOUTH RINSE: 15.0000 mL | OROMUCOSAL | Status: DC | PRN

## 2024-01-18 MED ORDER — FENTANYL CITRATE (PF) 100 MCG/2ML IJ SOLN
INTRAMUSCULAR | Status: AC
  Filled 2024-01-18: qty 2

## 2024-01-18 MED ORDER — ALBUTEROL SULFATE HFA 108 (90 BASE) MCG/ACT IN AERS
INHALATION_SPRAY | RESPIRATORY_TRACT | Status: DC | PRN
  Administered 2024-01-18: 5 via RESPIRATORY_TRACT

## 2024-01-18 MED ORDER — ONDANSETRON HCL 4 MG/2ML IJ SOLN: 4.0000 mg | Freq: Once | INTRAMUSCULAR | Status: DC | PRN

## 2024-01-18 MED ORDER — DEXMEDETOMIDINE HCL IN NACL 80 MCG/20ML IV SOLN
INTRAVENOUS | Status: DC | PRN
  Administered 2024-01-18: 8 ug via INTRAVENOUS
  Administered 2024-01-18: 4 ug via INTRAVENOUS

## 2024-01-18 SURGICAL SUPPLY — 22 items
BAG URO CATCHER STRL LF (MISCELLANEOUS) ×2 IMPLANT
BASKET ZERO TIP NITINOL 2.4FR (BASKET) IMPLANT
BENZOIN TINCTURE PRP APPL 2/3 (GAUZE/BANDAGES/DRESSINGS) IMPLANT
CATH URETERAL DUAL LUMEN 10F (MISCELLANEOUS) IMPLANT
CATH URETL OPEN END 6FR 70 (CATHETERS) IMPLANT
CLOTH BEACON ORANGE TIMEOUT ST (SAFETY) ×2 IMPLANT
DRSG TEGADERM 2-3/8X2-3/4 SM (GAUZE/BANDAGES/DRESSINGS) IMPLANT
FIBER LASER MOSES 200 DFL (Laser) IMPLANT
GLOVE BIOGEL M 7.0 STRL (GLOVE) ×2 IMPLANT
GOWN STRL REUS W/ TWL XL LVL3 (GOWN DISPOSABLE) ×2 IMPLANT
GUIDEWIRE STR DUAL SENSOR (WIRE) ×4 IMPLANT
GUIDEWIRE ZIPWRE .038 STRAIGHT (WIRE) IMPLANT
KIT TURNOVER KIT A (KITS) ×2 IMPLANT
MANIFOLD NEPTUNE II (INSTRUMENTS) ×2 IMPLANT
PACK CYSTO (CUSTOM PROCEDURE TRAY) ×2 IMPLANT
PAD PREP 24X48 CUFFED NSTRL (MISCELLANEOUS) ×2 IMPLANT
SHEATH DILATOR SET 8/10 (MISCELLANEOUS) IMPLANT
SHEATH NAVIGATOR HD 11/13X28 (SHEATH) IMPLANT
SHEATH NAVIGATOR HD 11/13X36 (SHEATH) IMPLANT
STENT URET 6FRX26 CONTOUR (STENTS) IMPLANT
TUBING CONNECTING 10 (TUBING) ×2 IMPLANT
TUBING UROLOGY SET (TUBING) ×2 IMPLANT

## 2024-01-18 NOTE — Op Note (Addendum)
 Operative Note  Preoperative diagnosis:  1.  Bilateral renal stones  Postoperative diagnosis: 1.  Same  Procedure(s): 1.  Cystoscopy 2. Bilateral ureteroscopy with laser lithotripsy and basket extraction of stones 3. Bilateral retrograde pyelogram 4. Bilateral ureteral stent placement 5. Fluoroscopy with intraoperative interpretation  Surgeon: Donnice Siad, MD  Assistants:  None  Anesthesia:  General  Complications:  None  EBL:  Minimal  Specimens: None  Drains/Catheters: 1.  Right 6Fr x 26cm ureteral stent without tether string 2.  Left 6Fr x 26cm ureteral stent without tether string  Intraoperative findings:   Cystoscopy demonstrated known dome fistula with some irregular appearance here however no suspicious appearance of malignancy.  Right trigone was purple over to the left.  Bilateral ureteral orifices had some edema with bilateral indwelling stents present. Right retrograde pyelogram demonstrated severe right hydronephrosis with stent in appropriate position. Left retrograde pyelogram demonstrated moderate left hydronephrosis with stent in appropriate position. Right ureteroscopy demonstrated significant amount of debris in her collecting system that was aspirated and ultimately identified several large stones total stone burden about 1.5 cm.  These were dusted in their entirety following this, there was significant debris and visualization was poor.  All other stone fragments were quite small. Left ureteroscopy some and demonstrated debris in her left collecting system and a stone about 8 mm that was fragmented in the left upper pole and then dusted to small bits. Successful bilateral ureteral stent placement.  Indication:  [name] is a 63 y.o. female with a history of bilateral renal stones here for for stage bilateral ureteroscopy with laser lithotripsy and BX extraction of stones.  Description of procedure: After informed consent was obtained from the  patient, the patient was identified and taken to the operating room and placed in the supine position.  General anesthesia was administered as well as perioperative IV antibiotics.  At the beginning of the case, a time-out was performed to properly identify the patient, the surgery to be performed, and the surgical site.  Sequential compression devices were applied to the lower extremities at the beginning of the case for DVT prophylaxis.  The patient was then placed in the dorsal lithotomy supine position, prepped and draped in sterile fashion.  We then passed the 21-French rigid cystoscope through the urethra and into the bladder under vision without any difficulty, noting a normal urethra.  A systematic evaluation of the bladder revealed no evidence of any suspicious bladder lesions.  Ureteral orifices were in normal position.    The distal aspect of the ureteral stent was seen protruding from the right ureteral orifice.  We then used the alligator-tooth forceps and grasped the distal end of the ureteral stent and brought it out the urethral meatus while watching the proximal coil straighten out nicely on fluoroscopy. Through the ureteral stent, we then passed a 0.038 sensor wire up to the level of the renal pelvis.  The ureteral stent was then removed, leaving the sensor wire up the right ureter.  The distal aspect of the ureteral stent was seen protruding from the left ureteral orifice.  We then used the alligator-tooth forceps and grasped the distal end of the ureteral stent and brought it out the urethral meatus while watching the proximal coil straighten out nicely on fluoroscopy. Through the ureteral stent, we then passed a 0.038 sensor wire up to the level of the renal pelvis.  The ureteral stent was then removed, leaving the sensor wire up the left ureter.  A semi-rigid ureteroscope was passed alongside the wire up the distal right ureter which appeared normal. A second 0.038 sensor wire was  passed under direct vision and the semirigid scope was removed. The flexible ureteroscope was advanced into the collecting system. The collecting system was inspected.  She has significant amount of debris present and this is aspirated using several large syringes.  Ultimately, the large stones were notified in the right lower pole.  Using the 272 micron holmium laser fiber, the stones were dusted completely.  Following this, embolization was quite limited given the amount of debris present.  With the ureteroscope in the kidney, a gentle pyelogram was performed to delineate the calyceal system and we evaluated the calyces systematically. We encountered no further large stones. The rest of the stone fragments were very tiny and these were  irrigated away gently. The calyces were re-inspected and there was some degree of stone burden in the right lower pole.  A semi-rigid ureteroscope was passed alongside the wire up the distal left ureter which appeared normal.  Several wire was passed and dual-lumen flexible ureteroscope was passed into the left collecting system.  Again, debris was aspirated.  Encountered approximately 8 mm left upper pole stone.  This was dusted in its entirety.  Repeat retrograde pyelograms performed noting no large stones present.  We then withdrew the ureteroscope back down the ureter, noting no evidence of any stones along the course of the ureter.  Prior to removing the ureteroscope, we did pass the Glidewire back up to the ureter to the renal pelvis.  Once the ureteroscope was removed, we then used the Glidewire under fluoroscopic guidance and passed up a 6-French x 26 cm double-pigtail ureteral stent up the ureter, making sure that the proximal and distal ends coiled within the kidney and bladder respectively.  I did not leave a tether string to either stent.    The cystoscope was then advanced back into the bladder under vision.  We were able to see the distal stents coiling nicely  within the bladder.  The bladder was then emptied with irrigation solution.  The cystoscope was then removed.    The patient tolerated the procedure well and there was no complication. Patient was awoken from anesthesia and taken to the recovery room in stable condition. I was present and scrubbed for the entirety of the case.  Plan:  Patient will be discharged home.  Will arrange for second look ureteroscopy.  Okay to discharge home from urology standpoint.   Matt R. Nasira Janusz MD [facility]  Pager: [phone]

## 2024-01-18 NOTE — Transfer of Care (Signed)
 Immediate Anesthesia Transfer of Care Note  Patient: [name]  Procedure(s) Performed: CYSTOSCOPY/URETEROSCOPY/HOLMIUM LASER/STENT PLACEMENT (Bilateral: Ureter)  Patient Location: PACU  Anesthesia Type:General  Level of Consciousness: awake, alert , oriented, and patient cooperative  Airway & Oxygen Therapy: Patient Spontanous Breathing and Patient connected to face mask oxygen  Post-op Assessment: Report given to RN and Post -op Vital signs reviewed and stable  Post vital signs: Reviewed and stable  Last Vitals:  Vitals Value Taken Time  BP 131/97 01/18/24 14:47  Temp    Pulse 84 01/18/24 14:52  Resp 24 01/18/24 14:52  SpO2 100 % 01/18/24 14:52  Vitals shown include unfiled device data.  Last Pain:  Vitals:   01/18/24 1142  TempSrc: Oral  PainSc:       Patients Stated Pain Goal: 3 (01/16/24 9356)  Complications: No notable events documented.

## 2024-01-18 NOTE — Progress Notes (Signed)
  Progress Note   Patient: [name] FMW:[id number] DOB: 1960/04/20 DOA: 01/13/2024     5 DOS: the patient was seen and examined on 01/18/2024   Brief hospital course: 63 year old woman PMH including PAF on Xarelto , urolithiasis.  Had bilateral ureteral stents placed October for obstruct during right ureteral stone and nonobstructing left renal calculi, scheduled for definitive surgery December 5.  Presented 12/3 with rapid heart rate, bilateral flank pain, dysuria.  Admitted for acute pyelonephritis, sepsis, urology consultation, PAF with rapid ventricular response.  Consultants Urology  Cardiology   Procedures/Events 12/3 admission for pyelo, afib RVR, plan for 12/5 urology intervention 12/4 transferred to [facility] per urology request 12/5 procedure canceled secondary to tachycardia  Assessment and Plan: Sepsis on admission secondary to pyelonephritis in the setting of nonobstructive stone with plans for definitive surgery 12/5 Mild bilateral hydronephrosis Plan was for definitive urologic intervention 12/5 under Dr. Selma but this was canceled secondary to tachycardia Sepsis resolved.  Narrowed antibiotics based on culture data. Cleared for procedure per conversation with Dr. Waddell 12/7.  Beta-blocker uptitrated.   PAF A-fib RVR on admission ruled out Recurrent SVT NSVT Trivial troponin elevation Missed Toprol -XL prior to admission which may have been an inciting event but she has had multiple instances since. Xarelto  on hold for procedure.  Resume Xarelto  once all procedures are performed Metoprolol  uptitrated by cardiology.  Follow-up with Dr. Almetta for SVT.   Diabetes mellitus type 2 Well-controlled with last two A1c below 7 Diet controlled, stable   AKI CKD stage IIIa Creatinine highly variable over the last year, baseline perhaps around 1-1.2 Appears to be back at baseline   Previous EtOH   Hepatic steatosis Aortic atherosclerosis   Class 3 obesity: BMI >=40  kg/m. Body mass index is 52.89 kg/m.      Subjective:  Feels fine, no problems  Physical Exam: Vitals:   01/18/24 0554 01/18/24 0828 01/18/24 1126 01/18/24 1142  BP: 124/73 122/78  (!) 143/89  Pulse: 80 85  85  Resp: 18   17  Temp: 97.9 F (36.6 C)   98 F (36.7 C)  TempSrc: Oral   Oral  SpO2: 98%   96%  Weight:   127 kg   Height:   5' 1 (1.549 m)    Physical Exam Vitals reviewed.  Constitutional:      General: She is not in acute distress.    Appearance: She is not ill-appearing or toxic-appearing.  Cardiovascular:     Rate and Rhythm: Normal rate and regular rhythm.     Heart sounds: No murmur heard. Pulmonary:     Effort: Pulmonary effort is normal. No respiratory distress.     Breath sounds: No wheezing, rhonchi or rales.  Neurological:     Mental Status: She is alert.  Psychiatric:        Mood and Affect: Mood normal.        Behavior: Behavior normal.     Data Reviewed: No new data  Family Communication: Husband at bedside  Disposition: Status is: Inpatient Remains inpatient appropriate because: procedure     Time spent: 20 minutes  Author: Toribio Door, MD 01/18/2024 2:40 PM  For on call review [email or web address].

## 2024-01-18 NOTE — Discharge Summary (Signed)
 Physician Discharge Summary   Patient: [name] MRN: [id number] DOB: 1960-06-02  Admit date:     01/13/2024  Discharge date: 01/18/24  Discharge Physician: Toribio Door   PCP: Norleen Lynwood ORN, MD   Recommendations at discharge:   Sepsis on admission secondary to pyelonephritis in the setting of nonobstructive stone with plans for definitive surgery 12/5 Mild bilateral hydronephrosis Status post procedure 12/8, cleared for discharge home, follow-up with urology as an outpatient.  Complete antibiotics as an outpatient.   PAF A-fib RVR on admission ruled out Recurrent SVT NSVT Trivial troponin elevation Metoprolol  uptitrated by cardiology.  Follow-up with Dr. Almetta for SVT.  Check TSH as outpatient.   Class 3 obesity: BMI >=40 kg/m. Body mass index is 52.89 kg/m.  Discharge Diagnoses: Principal Problem:   Sepsis due to gram-negative UTI (HCC) Active Problems:   PAF (paroxysmal atrial fibrillation) (HCC)   Hypomagnesemia   Essential hypertension   BMI 50.0-59.9, adult (HCC)   AKI (acute kidney injury)   Pyelonephritis   Dyslipidemia   SVT (supraventricular tachycardia)   CKD (chronic kidney disease), stage III (HCC)   Hepatic steatosis   Aortic atherosclerosis   Elevated troponin    Hospital Course: 63 year old woman PMH including PAF on Xarelto , urolithiasis.  Had bilateral ureteral stents placed October for obstruct during right ureteral stone and nonobstructing left renal calculi, scheduled for definitive surgery December 5.  Presented 12/3 with rapid heart rate, bilateral flank pain, dysuria.  Admitted for acute pyelonephritis, sepsis, urology consultation, PAF with rapid ventricular response.  Consultants Urology  Cardiology   Procedures/Events 12/3 admission for pyelo, afib RVR, plan for 12/5 urology intervention 12/4 transferred to [facility] per urology request 12/5 procedure canceled secondary to tachycardia 12/8  1.  Cystoscopy 2. Bilateral  ureteroscopy with laser lithotripsy and basket extraction of stones 3. Bilateral retrograde pyelogram 4. Bilateral ureteral stent placement 5. Fluoroscopy with intraoperative interpretation  Sepsis on admission secondary to pyelonephritis in the setting of nonobstructive stone with plans for definitive surgery 12/5 Mild bilateral hydronephrosis Plan was for definitive urologic intervention 12/5 under Dr. Selma but this was canceled secondary to tachycardia Sepsis resolved.  Narrowed antibiotics based on culture data. Cleared for procedure per conversation with Dr. Waddell 12/7.  Beta-blocker uptitrated. Status post procedure 12/8, cleared for discharge home, follow-up with urology as an outpatient.  Complete antibiotics as an outpatient.   PAF A-fib RVR on admission ruled out Recurrent SVT NSVT Trivial troponin elevation Missed Toprol -XL prior to admission which may have been an inciting event but she has had multiple instances since. Xarelto  on hold for procedure.  Resume Xarelto  tomorrow. Metoprolol  uptitrated by cardiology.  Follow-up with Dr. Almetta for SVT.  Check TSH as outpatient.   Diabetes mellitus type 2 Well-controlled with last two A1c below 7 Diet controlled, stable   AKI CKD stage IIIa Creatinine highly variable over the last year, baseline perhaps around 1-1.2 Appears to be back at baseline   Previous EtOH   Hepatic steatosis Aortic atherosclerosis   Class 3 obesity: BMI >=40 kg/m. Body mass index is 52.89 kg/m.      Pain control - [place]  Controlled Substance Reporting System database was reviewed.    Disposition: Home Diet recommendation:  Regular diet DISCHARGE MEDICATION: Allergies as of 01/18/2024       Reactions   Morphine  Itching   Shrimp [shellfish Allergy] Itching, Other (See Comments)   Tongue burns also   Tramadol  Other (See Comments)   Caused confusion   Covid-19 (  mrna) Vaccine Hives   Diltiazem  Hcl Itching   Pt with itching  of the feet when bolus given   Latex Rash        Medication List     PAUSE taking these medications    rivaroxaban  20 MG Tabs tablet Wait to take this until: January 19, 2024 Commonly known as: Xarelto  Take 1 tablet (20 mg total) by mouth daily with supper. Needs appt       STOP taking these medications    ciprofloxacin  250 MG tablet Commonly known as: CIPRO    docusate sodium  100 MG capsule Commonly known as: Colace       TAKE these medications    albuterol  108 (90 Base) MCG/ACT inhaler Commonly known as: VENTOLIN  HFA INHALE 2 PUFFS INTO THE LUNGS EVERY 6 HOURS AS NEEDED What changed:  how much to take how to take this when to take this reasons to take this   atorvastatin  20 MG tablet Commonly known as: LIPITOR Take 1 tablet (20 mg total) by mouth daily.   calcium  carbonate 500 MG chewable tablet Commonly known as: TUMS - dosed in mg elemental calcium  Chew 1 tablet by mouth daily as needed for indigestion or heartburn.   cefadroxil  500 MG capsule Commonly known as: DURICEF Take 2 capsules (1,000 mg total) by mouth 2 (two) times daily for 8 days.   CLEAR EYES FOR DRY EYES OP Place 1 drop into both eyes daily as needed (Dry eyes).   famotidine  20 MG tablet Commonly known as: PEPCID  Take 1 tablet (20 mg total) by mouth 2 (two) times daily.   LORazepam  0.5 MG tablet Commonly known as: ATIVAN  Take 1 tablet (0.5 mg total) by mouth 2 (two) times daily as needed for anxiety.   losartan -hydrochlorothiazide  100-25 MG tablet Commonly known as: HYZAAR Take 1 tablet by mouth daily.   metoprolol  succinate 25 MG 24 hr tablet Commonly known as: TOPROL -XL Take 1 tablet (25 mg total) by mouth in the morning and at bedtime. What changed: when to take this   ondansetron  4 MG disintegrating tablet Commonly known as: ZOFRAN -ODT TAKE 1 TABLET BY MOUTH EVERY 8 HOURS AS NEEDED FOR NAUSEA AND VOMITING What changed: See the new instructions.   oxybutynin  5 MG  tablet Commonly known as: DITROPAN  Take 5 mg by mouth every 8 (eight) hours as needed for bladder spasms.   oxyCODONE  5 MG immediate release tablet Commonly known as: Oxy IR/ROXICODONE  Take 1 tablet (5 mg total) by mouth every 6 (six) hours as needed for severe pain (pain score 7-10).   potassium chloride  10 MEQ tablet Commonly known as: Klor-Con  10 Take 1 tablet (10 mEq total) by mouth daily. What changed:  when to take this reasons to take this   triamcinolone  cream 0.1 % Commonly known as: KENALOG  APPLY 1 APPLICATION TOPICALLY 2 (TWO) TIMES DAILY AS NEEDED (ITCHING).        Follow-up Information     Norleen Lynwood ORN, MD. Schedule an appointment as soon as possible for a visit in 1 week(s).   Specialties: Internal Medicine, Radiology Contact information: [address] [place] [address] [phone]         Almetta Donnice LABOR, MD Follow up.   Specialty: Cardiology Why: Office will call you with an appointment Contact information: [address] [place] [address] [phone]                Discharge Exam: Fredricka Weights   01/14/24 0000 01/15/24 0615 01/18/24 1126  Weight: 127 kg 127 kg 127 kg   See progress note same day  Condition at discharge: good  The results of significant diagnostics from this hospitalization (including imaging, microbiology, ancillary and laboratory) are listed below for reference.   Imaging Studies: DG C-Arm 1-60 Min-No Report Result Date: 01/18/2024 Fluoroscopy was utilized by the requesting physician.  No radiographic interpretation.   DG C-Arm 1-60 Min-No Report Result Date: 01/18/2024 Fluoroscopy was utilized by the requesting physician.  No radiographic interpretation.   ECHOCARDIOGRAM COMPLETE Result Date: 01/15/2024    ECHOCARDIOGRAM REPORT   Patient Name:   VERONCIA JEZEK Pennywell Date of Exam: 01/15/2024 Medical Rec #:  [id number]       Height:       61.0 in Accession #:    [id number]      Weight:        279.9 lb Date of Birth:  05/25/1960       BSA:          2.179 m Patient Age:    63 years        BP:           109/76 mmHg Patient Gender: F               HR:           76 bpm. Exam Location:  Inpatient Procedure: 2D Echo (Both Spectral and Color Flow Doppler were utilized during            procedure). Indications:    Afib  History:        Patient has prior history of Echocardiogram examinations.                 Arrythmias:Atrial Fibrillation.  Sonographer:    Norleen Amour Referring Phys: [id number] LOGAN N LOCKWOOD IMPRESSIONS  1. Left ventricular ejection fraction, by estimation, is 45 to 50%. The left ventricle has mildly decreased function. The left ventricle demonstrates global hypokinesis. There is mild left ventricular hypertrophy. Left ventricular diastolic parameters are consistent with Grade I diastolic dysfunction (impaired relaxation). Elevated left atrial pressure.  2. Right ventricular systolic function is normal. The right ventricular size is normal.  3. Left atrial size was mildly dilated.  4. The mitral valve is normal in structure. No evidence of mitral valve regurgitation. No evidence of mitral stenosis.  5. The aortic valve has an indeterminant number of cusps. Aortic valve regurgitation is trivial. No aortic stenosis is present.  6. The inferior vena cava is normal in size with greater than 50% respiratory variability, suggesting right atrial pressure of 3 mmHg. FINDINGS  Left Ventricle: Left ventricular ejection fraction, by estimation, is 45 to 50%. The left ventricle has mildly decreased function. The left ventricle demonstrates global hypokinesis. The left ventricular internal cavity size was normal in size. There is  mild left ventricular hypertrophy. Left ventricular diastolic parameters are consistent with Grade I diastolic dysfunction (impaired relaxation). Elevated left atrial pressure. Right Ventricle: The right ventricular size is normal. Right ventricular systolic function is normal.  Left Atrium: Left atrial size was mildly dilated. Right Atrium: Right atrial size was normal in size. Pericardium: There is no evidence of pericardial effusion. Mitral Valve: The mitral valve is normal in structure. No evidence of mitral valve regurgitation. No evidence of mitral valve stenosis. MV peak gradient, 2.7 mmHg. The mean mitral valve gradient is 1.0 mmHg. Tricuspid Valve: The tricuspid valve is normal in structure. Tricuspid valve regurgitation is trivial. No evidence of tricuspid stenosis. Aortic Valve:  The aortic valve has an indeterminant number of cusps. Aortic valve regurgitation is trivial. No aortic stenosis is present. Aortic valve mean gradient measures 6.0 mmHg. Aortic valve peak gradient measures 11.4 mmHg. Pulmonic Valve: The pulmonic valve was normal in structure. Pulmonic valve regurgitation is trivial. No evidence of pulmonic stenosis. Aorta: The aortic root is normal in size and structure. Venous: The inferior vena cava is normal in size with greater than 50% respiratory variability, suggesting right atrial pressure of 3 mmHg. IAS/Shunts: No atrial level shunt detected by color flow Doppler.  LEFT VENTRICLE PLAX 2D LVIDd:         5.70 cm      Diastology LVIDs:         4.10 cm      LV e' medial:    3.81 cm/s LV PW:         0.90 cm      LV E/e' medial:  17.2 LV IVS:        1.00 cm      LV e' lateral:   4.90 cm/s LVOT diam:     2.10 cm      LV E/e' lateral: 13.4 LV SV:         48 LV SV Index:   22 LVOT Area:     3.46 cm  LV Volumes (MOD) LV vol d, MOD A2C: 97.6 ml LV vol d, MOD A4C: 131.0 ml LV vol s, MOD A2C: 42.3 ml LV vol s, MOD A4C: 61.2 ml LV SV MOD A2C:     55.3 ml LV SV MOD A4C:     131.0 ml LV SV MOD BP:      62.3 ml RIGHT VENTRICLE             IVC RV Basal diam:  3.70 cm     IVC diam: 1.80 cm RV S prime:     12.70 cm/s TAPSE (M-mode): 2.7 cm      PULMONARY VEINS                             Diastolic Velocity: 42.80 cm/s                             S/D Velocity:       0.80                              Systolic Velocity:  33.10 cm/s LEFT ATRIUM             Index        RIGHT ATRIUM           Index LA diam:        3.10 cm 1.42 cm/m   RA Area:     20.60 cm LA Vol (A2C):   56.0 ml 25.70 ml/m  RA Volume:   60.20 ml  27.63 ml/m LA Vol (A4C):   74.3 ml 34.10 ml/m LA Biplane Vol: 72.0 ml 33.05 ml/m  AORTIC VALVE                     PULMONIC VALVE AV Area (Vmax):    1.35 cm      PV Vmax:       0.86 m/s AV Area (Vmean):   1.35 cm      PV Peak grad:  3.0  mmHg AV Area (VTI):     1.49 cm AV Vmax:           169.00 cm/s AV Vmean:          119.000 cm/s AV VTI:            0.325 m AV Peak Grad:      11.4 mmHg AV Mean Grad:      6.0 mmHg LVOT Vmax:         65.90 cm/s LVOT Vmean:        46.400 cm/s LVOT VTI:          0.140 m LVOT/AV VTI ratio: 0.43  AORTA Ao Root diam: 2.90 cm Ao Asc diam:  3.20 cm MITRAL VALVE MV Area (PHT): 3.19 cm    SHUNTS MV Area VTI:   2.66 cm    Systemic VTI:  0.14 m MV Peak grad:  2.7 mmHg    Systemic Diam: 2.10 cm MV Mean grad:  1.0 mmHg MV Vmax:       0.82 m/s MV Vmean:      52.2 cm/s MV E velocity: 65.60 cm/s MV A velocity: 78.80 cm/s MV E/A ratio:  0.83 [name] Shallow MD Electronically signed by [name] Shallow MD Signature Date/Time: 01/15/2024/2:40:27 PM    Final    CT ABDOMEN PELVIS W CONTRAST Result Date: 01/13/2024 CLINICAL DATA:  Suspected pyelonephritis. EXAM: CT ABDOMEN AND PELVIS WITH CONTRAST TECHNIQUE: Multidetector CT imaging of the abdomen and pelvis was performed using the standard protocol following bolus administration of intravenous contrast. RADIATION DOSE REDUCTION: This exam was performed according to the departmental dose-optimization program which includes automated exposure control, adjustment of the mA and/or kV according to patient size and/or use of iterative reconstruction technique. CONTRAST:  75mL OMNIPAQUE  IOHEXOL  350 MG/ML SOLN COMPARISON:  None Available. FINDINGS: Lower chest: No acute abnormality. Hepatobiliary: There is diffuse fatty  infiltration of the liver parenchyma. No focal liver abnormality is seen. No gallstones, gallbladder wall thickening, or biliary dilatation. Pancreas: Unremarkable. No pancreatic ductal dilatation or surrounding inflammatory changes. Spleen: Normal in size without focal abnormality. Adrenals/Urinary Tract: Adrenal glands are unremarkable. Kidneys are normal in size. Bilateral properly positioned endo ureteral stents are in place with a small amount of air seen within the proximal right ureter and intrarenal calyx of the mid right kidney. A 10 mm nonobstructing renal calculus is seen within the mid right kidney. Very mild bilateral hydronephrosis is seen with mild bilateral peripelvic inflammatory fat stranding noted. Bladder is unremarkable. Stomach/Bowel: Stomach is within normal limits. Appendix appears normal. No evidence of bowel wall thickening, distention, or inflammatory changes. Numerous noninflamed diverticula are seen throughout the large bowel. Vascular/Lymphatic: Aortic atherosclerosis. No enlarged abdominal or pelvic lymph nodes. Reproductive: Status post hysterectomy. No adnexal masses. Other: No abdominal wall hernia or abnormality. No abdominopelvic ascites. Musculoskeletal: Multilevel degenerative changes are seen throughout the lumbar spine. IMPRESSION: 1. Bilateral properly positioned endo ureteral stents with a small amount of air seen within the proximal right ureter and intrarenal calyx of the mid right kidney. 2. 10 mm nonobstructing renal calculus within the mid right kidney. 3. Very mild bilateral hydronephrosis with mild bilateral peripelvic inflammatory fat stranding. While this may be postprocedural in origin, sequelae associated with acute pyelonephritis cannot be excluded. 4. Colonic diverticulosis. 5. Hepatic steatosis. 6. Aortic atherosclerosis. Electronically Signed   By: Suzen Dials M.D.   On: 01/13/2024 21:32   DG Chest 2 View Result Date: 01/13/2024 EXAM: 2 VIEW(S) XRAY  OF THE CHEST  01/13/2024 05:56:00 PM COMPARISON: 04/29/2023 CLINICAL HISTORY: SHOB, palpitations FINDINGS: LUNGS AND PLEURA: Under penetrated radiograph. Low lung volumes. No focal pulmonary opacity. No pleural effusion. No pneumothorax. HEART AND MEDIASTINUM: Cardiomegaly. BONES AND SOFT TISSUES: No acute osseous abnormality. IMPRESSION: 1. Cardiomegaly. 2. Low lung volumes. Electronically signed by: Norman Gatlin MD 01/13/2024 07:06 PM EST RP Workstation: [id number]    Microbiology: Results for orders placed or performed during the hospital encounter of 01/13/24  Urine Culture (for pregnant, neutropenic or urologic patients or patients with an indwelling urinary catheter)     Status: Abnormal   Collection Time: 01/14/24  9:50 AM   Specimen: Urine, Catheterized  Result Value Ref Range Status   Specimen Description URINE, CATHETERIZED  Final   Special Requests   Final    NONE Performed at [facility], [address], [place], [place] [address]    Culture >=100,000 COLONIES/mL ESCHERICHIA COLI (A)  Final   Report Status 01/16/2024 FINAL  Final   Organism ID, Bacteria ESCHERICHIA COLI (A)  Final      Susceptibility   Escherichia coli - MIC*    AMPICILLIN 16 INTERMEDIATE Intermediate     CEFAZOLIN  (URINE) Value in next row Sensitive      <=1 SENSITIVEThis is a modified FDA-approved test that has been validated and its performance characteristics determined by the reporting laboratory.  This laboratory is certified under the Clinical Laboratory Improvement Amendments CLIA as qualified to perform high complexity clinical laboratory testing.    CEFEPIME  Value in next row Sensitive      <=1 SENSITIVEThis is a modified FDA-approved test that has been validated and its performance characteristics determined by the reporting laboratory.  This laboratory is certified under the Clinical Laboratory Improvement Amendments CLIA as qualified to perform high complexity clinical laboratory testing.     ERTAPENEM  Value in next row Sensitive      <=1 SENSITIVEThis is a modified FDA-approved test that has been validated and its performance characteristics determined by the reporting laboratory.  This laboratory is certified under the Clinical Laboratory Improvement Amendments CLIA as qualified to perform high complexity clinical laboratory testing.    CEFTRIAXONE  Value in next row Sensitive      <=1 SENSITIVEThis is a modified FDA-approved test that has been validated and its performance characteristics determined by the reporting laboratory.  This laboratory is certified under the Clinical Laboratory Improvement Amendments CLIA as qualified to perform high complexity clinical laboratory testing.    CIPROFLOXACIN  Value in next row Resistant      <=1 SENSITIVEThis is a modified FDA-approved test that has been validated and its performance characteristics determined by the reporting laboratory.  This laboratory is certified under the Clinical Laboratory Improvement Amendments CLIA as qualified to perform high complexity clinical laboratory testing.    GENTAMICIN Value in next row Sensitive      <=1 SENSITIVEThis is a modified FDA-approved test that has been validated and its performance characteristics determined by the reporting laboratory.  This laboratory is certified under the Clinical Laboratory Improvement Amendments CLIA as qualified to perform high complexity clinical laboratory testing.    NITROFURANTOIN Value in next row Sensitive      <=1 SENSITIVEThis is a modified FDA-approved test that has been validated and its performance characteristics determined by the reporting laboratory.  This laboratory is certified under the Clinical Laboratory Improvement Amendments CLIA as qualified to perform high complexity clinical laboratory testing.    TRIMETH/SULFA Value in next row Sensitive      <=1  SENSITIVEThis is a modified FDA-approved test that has been validated and its performance characteristics  determined by the reporting laboratory.  This laboratory is certified under the Clinical Laboratory Improvement Amendments CLIA as qualified to perform high complexity clinical laboratory testing.    AMPICILLIN/SULBACTAM Value in next row Sensitive      <=1 SENSITIVEThis is a modified FDA-approved test that has been validated and its performance characteristics determined by the reporting laboratory.  This laboratory is certified under the Clinical Laboratory Improvement Amendments CLIA as qualified to perform high complexity clinical laboratory testing.    PIP/TAZO Value in next row Sensitive      <=4 SENSITIVEThis is a modified FDA-approved test that has been validated and its performance characteristics determined by the reporting laboratory.  This laboratory is certified under the Clinical Laboratory Improvement Amendments CLIA as qualified to perform high complexity clinical laboratory testing.    MEROPENEM  Value in next row Sensitive      <=4 SENSITIVEThis is a modified FDA-approved test that has been validated and its performance characteristics determined by the reporting laboratory.  This laboratory is certified under the Clinical Laboratory Improvement Amendments CLIA as qualified to perform high complexity clinical laboratory testing.    * >=100,000 COLONIES/mL ESCHERICHIA COLI  Culture, blood (Routine X 2) w Reflex to ID Panel     Status: None (Preliminary result)   Collection Time: 01/14/24 10:28 AM   Specimen: BLOOD LEFT ARM  Result Value Ref Range Status   Specimen Description BLOOD LEFT ARM  Final   Special Requests   Final    BOTTLES DRAWN AEROBIC AND ANAEROBIC Blood Culture adequate volume   Culture   Final    NO GROWTH 4 DAYS Performed at [facility], [address], [place], [place] [address]    Report Status PENDING  Incomplete  Culture, blood (Routine X 2) w Reflex to ID Panel     Status: None (Preliminary result)   Collection Time: 01/14/24 10:30 AM   Specimen:  BLOOD RIGHT HAND  Result Value Ref Range Status   Specimen Description BLOOD RIGHT HAND  Final   Special Requests   Final    BOTTLES DRAWN AEROBIC ONLY Blood Culture adequate volume   Culture   Final    NO GROWTH 4 DAYS Performed at [facility], [address], [place], [place] [address]    Report Status PENDING  Incomplete   *Note: Due to a large number of results and/or encounters for the requested time period, some results have not been displayed. A complete set of results can be found in Results Review.    Labs: CBC: Recent Labs  Lab 01/13/24 1727 01/14/24 0450  WBC 10.6* 9.3  HGB 12.6 11.3*  HCT 39.3 35.6*  MCV 92.9 93.2  PLT 231 207   Basic Metabolic Panel: Recent Labs  Lab 01/13/24 1727 01/14/24 0450 01/15/24 0940 01/16/24 0425  NA 139 138 140 140  K 3.8 3.8 3.9 4.0  CL 104 106 104 106  CO2 24 27 26 25   GLUCOSE 118* 135* 169* 115*  BUN 32* 32* 29* 25*  CREATININE 1.56* 1.61* 1.47* 1.15*  CALCIUM  8.6* 8.2* 9.0 8.4*  MG 1.5* 1.8 2.1 2.0  PHOS  --   --  3.2 3.5   Liver Function Tests: No results for input(s): AST, ALT, ALKPHOS, BILITOT, PROT, ALBUMIN in the last 168 hours. CBG: No results for input(s): GLUCAP in the last 168 hours.  Discharge time spent: greater than 30  minutes.  Signed: Toribio Door, MD [facility] 01/18/2024

## 2024-01-18 NOTE — TOC Transition Note (Signed)
 Transition of Care [facility]) - Discharge Note   Patient Details  Name: [name] MRN: [id number] Date of Birth: 1960-07-19  Transition of Care [facility]) CM/SW Contact:  Tawni CHRISTELLA Eva, LCSW Phone Number: 01/18/2024, 4:25 PM   Clinical Narrative:     Pt to d/c home with family providing transport home. No further ICM needs ICM sign off.   Final next level of care: Home/Self Care Barriers to Discharge: Barriers Resolved   Patient Goals and CMS Choice Patient states their goals for this hospitalization and ongoing recovery are:: Eat healthier and excercise more.   Choice offered to / list presented to : NA      Discharge Placement                    Patient and family notified of of transfer: 01/18/24  Discharge Plan and Services Additional resources added to the After Visit Summary for   In-house Referral: Clinical Social Work                                   Social Drivers of Health (SDOH) Interventions SDOH Screenings   Food Insecurity: No Food Insecurity (01/14/2024)  Housing: Low Risk  (01/14/2024)  Transportation Needs: No Transportation Needs (01/14/2024)  Utilities: Not At Risk (01/14/2024)  Depression (PHQ2-9): Low Risk  (11/20/2023)  Financial Resource Strain: Medium Risk (11/07/2021)  Tobacco Use: Medium Risk (01/18/2024)     Readmission Risk Interventions    11/19/2023   10:52 AM 11/17/2023    1:00 PM 10/06/2023    3:11 PM  Readmission Risk Prevention Plan  Transportation Screening Complete Complete Complete  PCP or Specialist Appt within 5-7 Days  Complete   PCP or Specialist Appt within 3-5 Days Complete  Complete  Home Care Screening  Complete   Medication Review (RN CM)  Referral to Pharmacy   HRI or Home Care Consult Complete  Complete  Social Work Consult for Recovery Care Planning/Counseling Complete  Complete  Palliative Care Screening Not Applicable  Not Applicable  Medication Review (RN Care Manager) Referral to Pharmacy   Referral to Pharmacy

## 2024-01-18 NOTE — Progress Notes (Signed)
*   Day of Surgery * Subjective: Denies pain. Tachycardia resolved. Ok to proceed with URS/LL.  Objective: Vital signs in last 24 hours: Temp:  [97.9 F (36.6 C)-98.3 F (36.8 C)] 98 F (36.7 C) (12/08 1142) Pulse Rate:  [79-90] 85 (12/08 1142) Resp:  [17-27] 17 (12/08 1142) BP: (115-143)/(72-89) 143/89 (12/08 1142) SpO2:  [96 %-98 %] 96 % (12/08 1142) Weight:  [127 kg] 127 kg (12/08 1126)  Intake/Output from previous day: 12/07 0701 - 12/08 0700 In: 340 [P.O.:340] Out: -  Intake/Output this shift: Total I/O In: 15.4 [I.V.:15.4] Out: -   Physical Exam:  General: Alert and oriented CV: RRR Lungs: Clear Abdomen: Soft, ND, NT Ext: NT, No erythema  Lab Results: No results for input(s): HGB, HCT in the last 72 hours. BMET Recent Labs    01/16/24 0425  NA 140  K 4.0  CL 106  CO2 25  GLUCOSE 115*  BUN 25*  CREATININE 1.15*  CALCIUM  8.4*     Studies/Results: No results found.  Assessment/Plan: Bilateral ureteral/renal stones here for b/l URS/LL   -To OR for cysto, b/l URS/LL, stent exchange   LOS: 5 days   Matt R. Alano Blasco MD 01/18/2024, 11:54 AM [facility]  Pager: [phone]

## 2024-01-18 NOTE — Anesthesia Preprocedure Evaluation (Addendum)
 Anesthesia Evaluation  Patient identified by MRN, date of birth, ID band Patient awake    Reviewed: Allergy & Precautions, H&P , NPO status , Patient's Chart, lab work & pertinent test results  History of Anesthesia Complications Negative for: history of anesthetic complications  Airway Mallampati: III  TM Distance: >3 FB Neck ROM: Full  Mouth opening: Limited Mouth Opening  Dental  (+) Dental Advisory Given, Poor Dentition, Chipped, Missing   Pulmonary neg shortness of breath, asthma , neg sleep apnea, pneumonia, neg recent URI, former smoker, PE    + wheezing      Cardiovascular hypertension, Pt. on medications and Pt. on home beta blockers (-) angina + Peripheral Vascular Disease and +CHF  (-) Past MI + dysrhythmias Atrial Fibrillation + Valvular Problems/Murmurs  Rhythm:Regular   Echo 04/03/2020:  1. Left ventricular ejection fraction, by estimation, is 50 to 55%. The left ventricle has low normal function. The left ventricle has no regional wall motion abnormalities. There is mild concentric left ventricular hypertrophy. Left ventricular diastolic parameters are indeterminate.  2. Right ventricular systolic function is normal. The right ventricular size is normal. Tricuspid regurgitation signal is inadequate for assessing PA pressure.  3. Left atrial size was mildly dilated.  4. Right atrial size was mildly dilated.  5. A small pericardial effusion is present. The pericardial effusion is circumferential.  6. The mitral valve is normal in structure. No evidence of mitral valve regurgitation. No evidence of mitral stenosis.  7. The aortic valve is grossly normal. Aortic valve regurgitation is not visualized. No aortic stenosis is present.  8. The inferior vena cava is normal in size with <50% respiratory variability, suggesting right atrial pressure of 8 mmHg.    Neuro/Psych  PSYCHIATRIC DISORDERS Anxiety Depression     negative neurological ROS     GI/Hepatic Neg liver ROS,GERD  Medicated and Controlled,,  Endo/Other    Class 4 obesity  Renal/GU Renal InsufficiencyRenal disease     Musculoskeletal  (+) Arthritis ,    Abdominal   Peds  Hematology  (+) Blood dyscrasia, anemia   Anesthesia Other Findings   Reproductive/Obstetrics                              Anesthesia Physical Anesthesia Plan  ASA: 3  Anesthesia Plan: General   Post-op Pain Management: Minimal or no pain anticipated   Induction: Intravenous  PONV Risk Score and Plan: 3 and Ondansetron  and Dexamethasone   Airway Management Planned: LMA  Additional Equipment: None  Intra-op Plan:   Post-operative Plan: Extubation in OR  Informed Consent: I have reviewed the patients History and Physical, chart, labs and discussed the procedure including the risks, benefits and alternatives for the proposed anesthesia with the patient or authorized representative who has indicated his/her understanding and acceptance.     Dental advisory given  Plan Discussed with: CRNA, Anesthesiologist and Surgeon  Anesthesia Plan Comments: (DISCUSSION: [name] is a 63 yo female with PMH of former smoking, HTN, A flutter on Xarelto , hx of PE, asthma, GERD, hx of ETOH abuse, obesity (BMI 52)   Admitted from 8/24-8/28/2025 with sepsis secondary to UTI, underwent cystoscopy with stent placement.    OR for cystoscopy, ureteroscopy, lithotripsy and stent exchange on 9/19. No complications noted.   Admitted again from 10/5-10/9 for urosepsis from another obstructing stone. Went to the OR 10/5 for bilateral stent placement. Patient discharged with PICC line and ID follow up.  Seen by ID on 10/24. She finished a course of ertapenem  via PICC line. Ciprofloxacin  prescribed for suppression therapy to prevent recurrent UTI until stone removal.    Pt follows with a-fib clinic, last seen 12/19/2022. At this visit pt  asymptomatic. Per notes paroxysmal a-fib likely in setting of alcohol abuse, continues on metoprolol  and Xarelto . 1 year follow up recommended at this visit. Per a-fib clinic pt can hold Xarelto  2 days prior to procedure. No recent recurrent a-fib. Pt denies chest pain or shortness of breath, reports she can climb a flight of stairs without sx.    At PAT visit pt reports she can do a flight of stairs without CP/SOB   LD Xarelto : 12/1 )         Anesthesia Quick Evaluation

## 2024-01-18 NOTE — Progress Notes (Signed)
 Discharge medications delivered to patient at the bedside.

## 2024-01-18 NOTE — Anesthesia Postprocedure Evaluation (Signed)
 Anesthesia Post Note  Patient: [name]  Procedure(s) Performed: CYSTOSCOPY/URETEROSCOPY/HOLMIUM LASER/STENT PLACEMENT (Bilateral: Ureter)     Patient location during evaluation: PACU Anesthesia Type: General Level of consciousness: awake and alert Pain management: pain level controlled Vital Signs Assessment: post-procedure vital signs reviewed and stable Respiratory status: spontaneous breathing, nonlabored ventilation, respiratory function stable and patient connected to nasal cannula oxygen Cardiovascular status: blood pressure returned to baseline and stable Postop Assessment: no apparent nausea or vomiting Anesthetic complications: no   No notable events documented.  Last Vitals:  Vitals:   01/18/24 1142 01/18/24 1447  BP: (!) 143/89 (!) 131/97  Pulse: 85 85  Resp: 17 15  Temp: 36.7 C 37.2 C  SpO2: 96% 99%    Last Pain:  Vitals:   01/18/24 1447  TempSrc:   PainSc: 0-No pain                 Keah Lamba

## 2024-01-18 NOTE — Anesthesia Procedure Notes (Signed)
 Procedure Name: LMA Insertion Date/Time: 01/18/2024 1:16 PM  Performed by: Kathern Rollene LABOR, CRNAPre-anesthesia Checklist: Patient identified, Emergency Drugs available, Suction available and Patient being monitored Patient Re-evaluated:Patient Re-evaluated prior to induction Oxygen Delivery Method: Circle system utilized Preoxygenation: Pre-oxygenation with 100% oxygen Induction Type: IV induction Ventilation: Mask ventilation without difficulty LMA: LMA inserted LMA Size: 4.0 Tube type: Oral Number of attempts: 1 Placement Confirmation: positive ETCO2 and breath sounds checked- equal and bilateral Tube secured with: Tape Dental Injury: Teeth and Oropharynx as per pre-operative assessment

## 2024-01-19 ENCOUNTER — Telehealth: Payer: Self-pay | Admitting: *Deleted

## 2024-01-19 ENCOUNTER — Other Ambulatory Visit: Payer: Self-pay | Admitting: Urology

## 2024-01-19 ENCOUNTER — Encounter (HOSPITAL_COMMUNITY): Payer: Self-pay | Admitting: Urology

## 2024-01-19 LAB — CULTURE, BLOOD (ROUTINE X 2)
Culture: NO GROWTH
Culture: NO GROWTH
Special Requests: ADEQUATE
Special Requests: ADEQUATE

## 2024-01-19 NOTE — Transitions of Care (Post Inpatient/ED Visit) (Signed)
   01/19/2024  Name: MADELYNNE LASKER MRN: [id number] DOB: 02-20-1960  Today's TOC FU Call Status: Today's TOC FU Call Status:: Unsuccessful Call (1st Attempt) Unsuccessful Call (1st Attempt) Date: 01/19/24  Attempted to reach the patient regarding the most recent Inpatient visit.  Left HIPAA compliant voice message requesting call back  Follow Up Plan: Additional outreach attempts will be made to reach the patient to complete the Transitions of Care (Post Inpatient/ED visit) call.   Pls call/ message for questions,  Derrell Milanes Mckinney Kristopher Attwood, RN, BSN, CCRN Alumnus RN Care Manager  Transitions of Care  [facility] - [facility] [phone]: direct office

## 2024-01-20 ENCOUNTER — Telehealth: Payer: Self-pay | Admitting: *Deleted

## 2024-01-20 NOTE — Transitions of Care (Post Inpatient/ED Visit) (Signed)
° °  01/20/2024  Name: [name] MRN: [id number] DOB: 1960-03-07  Today's TOC FU Call Status: Today's TOC FU Call Status:: Unsuccessful Call (2nd Attempt) Unsuccessful Call (2nd Attempt) Date: 01/20/24  Attempted to reach the patient regarding the most recent Inpatient visit.  Left HIPAA compliant voice message   Follow Up Plan: Additional outreach attempts will be made to reach the patient to complete the Transitions of Care (Post Inpatient/ED visit) call.   Pls call/ message for questions,  Jafet Wissing Mckinney Jane Broughton, RN, BSN, CCRN Alumnus RN Care Manager  Transitions of Care  [facility] - [facility] [phone]: direct office

## 2024-01-21 ENCOUNTER — Telehealth: Payer: Self-pay | Admitting: *Deleted

## 2024-01-21 NOTE — Transitions of Care (Post Inpatient/ED Visit) (Signed)
° °  01/21/2024  Name: [name] MRN: [id number] DOB: 09/14/1960  Today's TOC FU Call Status: Today's TOC FU Call Status:: Unsuccessful Call (3rd Attempt) Unsuccessful Call (3rd Attempt) Date: 01/21/24  Attempted to reach the patient regarding the most recent Inpatient visit.  Left HIPAA compliant voice message   Follow Up Plan: No further outreach attempts will be made at this time. We have been unable to contact the patient.  Pls call/ message for questions,  Kirstan Fentress Mckinney Diana Armijo, RN, BSN, CCRN Alumnus RN Care Manager  Transitions of Care  [facility] - [facility] [phone]: direct office

## 2024-01-22 ENCOUNTER — Encounter: Payer: Self-pay | Admitting: Internal Medicine

## 2024-01-22 ENCOUNTER — Ambulatory Visit: Admitting: Internal Medicine

## 2024-01-22 ENCOUNTER — Other Ambulatory Visit: Payer: Self-pay | Admitting: Internal Medicine

## 2024-01-22 ENCOUNTER — Ambulatory Visit: Payer: Self-pay | Admitting: Internal Medicine

## 2024-01-22 VITALS — BP 130/80 | HR 68 | Temp 97.8°F | Ht 61.0 in | Wt 286.0 lb

## 2024-01-22 DIAGNOSIS — N1832 Chronic kidney disease, stage 3b: Secondary | ICD-10-CM

## 2024-01-22 DIAGNOSIS — E559 Vitamin D deficiency, unspecified: Secondary | ICD-10-CM

## 2024-01-22 DIAGNOSIS — E7800 Pure hypercholesterolemia, unspecified: Secondary | ICD-10-CM

## 2024-01-22 DIAGNOSIS — Z1211 Encounter for screening for malignant neoplasm of colon: Secondary | ICD-10-CM

## 2024-01-22 DIAGNOSIS — R7302 Impaired glucose tolerance (oral): Secondary | ICD-10-CM

## 2024-01-22 DIAGNOSIS — N10 Acute tubulo-interstitial nephritis: Secondary | ICD-10-CM

## 2024-01-22 DIAGNOSIS — K219 Gastro-esophageal reflux disease without esophagitis: Secondary | ICD-10-CM

## 2024-01-22 DIAGNOSIS — I10 Essential (primary) hypertension: Secondary | ICD-10-CM

## 2024-01-22 LAB — HEPATIC FUNCTION PANEL
ALT: 22 U/L (ref 0–35)
AST: 22 U/L (ref 0–37)
Albumin: 3.2 g/dL — ABNORMAL LOW (ref 3.5–5.2)
Alkaline Phosphatase: 97 U/L (ref 39–117)
Bilirubin, Direct: 0 mg/dL (ref 0.0–0.3)
Total Bilirubin: 0.3 mg/dL (ref 0.2–1.2)
Total Protein: 7 g/dL (ref 6.0–8.3)

## 2024-01-22 LAB — BASIC METABOLIC PANEL WITH GFR
BUN: 28 mg/dL — ABNORMAL HIGH (ref 6–23)
CO2: 29 meq/L (ref 19–32)
Calcium: 9 mg/dL (ref 8.4–10.5)
Chloride: 105 meq/L (ref 96–112)
Creatinine, Ser: 1.52 mg/dL — ABNORMAL HIGH (ref 0.40–1.20)
GFR: 36.45 mL/min — ABNORMAL LOW (ref >60.00)
Glucose, Bld: 97 mg/dL (ref 70–99)
Potassium: 3.9 meq/L (ref 3.5–5.1)
Sodium: 142 meq/L (ref 135–145)

## 2024-01-22 LAB — LIPID PANEL
Cholesterol: 146 mg/dL (ref 0–200)
HDL: 48.8 mg/dL (ref >39.00)
LDL Cholesterol: 79 mg/dL (ref 0–99)
NonHDL: 97.38
Total CHOL/HDL Ratio: 3
Triglycerides: 91 mg/dL (ref 0.0–149.0)
VLDL: 18.2 mg/dL (ref 0.0–40.0)

## 2024-01-22 LAB — TSH: TSH: 3.5 u[IU]/mL (ref 0.35–5.50)

## 2024-01-22 LAB — CBC WITH DIFFERENTIAL/PLATELET
Basophils Absolute: 0 K/uL (ref 0.0–0.1)
Basophils Relative: 0.4 % (ref 0.0–3.0)
Eosinophils Absolute: 0.2 K/uL (ref 0.0–0.7)
Eosinophils Relative: 2.1 % (ref 0.0–5.0)
HCT: 34.2 % — ABNORMAL LOW (ref 36.0–46.0)
Hemoglobin: 11.1 g/dL — ABNORMAL LOW (ref 12.0–15.0)
Lymphocytes Relative: 15.7 % (ref 12.0–46.0)
Lymphs Abs: 1.9 K/uL (ref 0.7–4.0)
MCHC: 32.6 g/dL (ref 30.0–36.0)
MCV: 91.9 fl (ref 78.0–100.0)
Monocytes Absolute: 1 K/uL (ref 0.1–1.0)
Monocytes Relative: 8.3 % (ref 3.0–12.0)
Neutro Abs: 8.9 K/uL — ABNORMAL HIGH (ref 1.4–7.7)
Neutrophils Relative %: 73.5 % (ref 43.0–77.0)
Platelets: 268 K/uL (ref 150.0–400.0)
RBC: 3.72 Mil/uL — ABNORMAL LOW (ref 3.87–5.11)
RDW: 14.5 % (ref 11.5–15.5)
WBC: 12.1 K/uL — ABNORMAL HIGH (ref 4.0–10.5)

## 2024-01-22 LAB — HEMOGLOBIN A1C: Hgb A1c MFr Bld: 6 % (ref 4.6–6.5)

## 2024-01-22 MED ORDER — POTASSIUM CHLORIDE ER 10 MEQ PO TBCR: 10.0000 meq | EXTENDED_RELEASE_TABLET | Freq: Every day | ORAL | 3 refills | Status: AC

## 2024-01-22 MED ORDER — FAMOTIDINE 20 MG PO TABS: 20.0000 mg | ORAL_TABLET | Freq: Two times a day (BID) | ORAL | 3 refills | Status: AC

## 2024-01-22 MED ORDER — ALBUTEROL SULFATE HFA 108 (90 BASE) MCG/ACT IN AERS: INHALATION_SPRAY | RESPIRATORY_TRACT | 5 refills | Status: AC

## 2024-01-22 MED ORDER — ONDANSETRON 4 MG PO TBDP: 4.0000 mg | ORAL_TABLET | Freq: Three times a day (TID) | ORAL | 1 refills | Status: AC | PRN

## 2024-01-22 MED ORDER — LOSARTAN POTASSIUM-HCTZ 100-25 MG PO TABS: 1.0000 | ORAL_TABLET | Freq: Every day | ORAL | 3 refills | Status: AC

## 2024-01-22 MED ORDER — ATORVASTATIN CALCIUM 20 MG PO TABS: 20.0000 mg | ORAL_TABLET | Freq: Every day | ORAL | 3 refills | Status: AC

## 2024-01-22 MED ORDER — LORAZEPAM 0.5 MG PO TABS: 0.5000 mg | ORAL_TABLET | Freq: Two times a day (BID) | ORAL | 2 refills | Status: AC | PRN

## 2024-01-22 NOTE — Progress Notes (Signed)
 Patient ID: [name], female   DOB: 07-Jul-1960, 63 y.o.   MRN: [id number]        Chief Complaint: follow up post hospn dec 3 - 8 2025       HPI:  TAKASHA [name] is a 63 y.o. female here as above, having had her third episode of renal stone with infection, this time with pyelonephritis and sepsis.  Has bilateral ureteral stents with plan to f/u urology next week.  Illness complicated by recurrent SVT.  Pt denies chest pain, increased sob or doe, wheezing, orthopnea, PND, increased LE swelling, palpitations, dizziness or syncope.   Pt denies polydipsia, polyuria, or new focal neuro s/s.    Pt denies fever, wt loss, night sweats, loss of appetite, or other constitutional symptoms  due for colonoscopy, pt will call for mammogram.         Wt Readings from Last 3 Encounters:  01/22/24 286 lb (129.7 kg)  01/18/24 279 lb 14.4 oz (127 kg)  01/08/24 278 lb (126.1 kg)   BP Readings from Last 3 Encounters:  01/22/24 130/80  01/18/24 (!) 152/97  01/08/24 128/79         Past Medical History:  Diagnosis Date   Abscess of bladder 07/28/2016   Alcohol dependence (HCC)    Allergic rhinitis 12/06/2013   Anxiety    Aortic atherosclerosis 01/15/2024   Asthma 03/16/2015   Atrial flutter with rapid ventricular response (HCC) 04/01/2020   COLONIC POLYPS, HX OF 04/05/2010   COVID-19 09/2023   DIVERTICULITIS, HX OF 04/05/2010   DJD (degenerative joint disease)    right knee, mot to severe   Dysrhythmia    Fatty liver    GERD (gastroesophageal reflux disease)    no meds   Heart murmur    hx of    Hemorrhoids    Hepatic steatosis 01/15/2024   History of kidney stones    Hyperlipidemia    Hypertension    Impaired glucose tolerance 12/06/2013   Kidney stones    Morbid obesity with BMI of 50.0-59.9, adult (HCC)    Nausea & vomiting 05/24/2022   Peripheral vascular disease    Pulmonary embolism (HCC)    SVT (supraventricular tachycardia) 01/15/2024   Past Surgical History:  Procedure  Laterality Date   ABDOMINAL HYSTERECTOMY  age 52   fibroids   BREAST BIOPSY Left    COLONOSCOPY WITH PROPOFOL  N/A 07/25/2016   Procedure: COLONOSCOPY WITH PROPOFOL ;  Surgeon: Rollin Dover, MD;  Location: [facility];  Service: Endoscopy;  Laterality: N/A;   colonscopy     x 2   CYSTOSCOPY W/ RETROGRADES Right 10/30/2023   Procedure: CYSTOSCOPY, WITH RETROGRADE PYELOGRAM;  Surgeon: Selma Donnice SAUNDERS, MD;  Location: [facility];  Service: Urology;  Laterality: Right;   CYSTOSCOPY W/ URETERAL STENT PLACEMENT Right 09/05/2021   Procedure: CYSTOSCOPY WITH RETROGRADE PYELOGRAM/URETERAL STENT PLACEMENT;  Surgeon: Selma Donnice SAUNDERS, MD;  Location: [facility];  Service: Urology;  Laterality: Right;   CYSTOSCOPY W/ URETERAL STENT PLACEMENT Right 10/04/2023   Procedure: CYSTOSCOPY, WITH RETROGRADE PYELOGRAM AND URETERAL STENT INSERTION;  Surgeon: Shane Steffan BROCKS, MD;  Location: [facility] OR;  Service: Urology;  Laterality: Right;   CYSTOSCOPY W/ URETERAL STENT PLACEMENT Right 11/15/2023   Procedure: CYSTOSCOPY, WITH RETROGRADE PYELOGRAM AND URETERAL STENT INSERTION;  Surgeon: Selma Donnice SAUNDERS, MD;  Location: [facility] OR;  Service: Urology;  Laterality: Right;   CYSTOSCOPY WITH RETROGRADE PYELOGRAM, URETEROSCOPY AND STENT PLACEMENT Left 08/14/2022   Procedure: CYSTOSCOPY WITH RETROGRADE PYELOGRAM;  Surgeon:  Selma Donnice SAUNDERS, MD;  Location: [facility] OR;  Service: Urology;  Laterality: Left;   CYSTOSCOPY WITH URETEROSCOPY AND STENT PLACEMENT Left 08/14/2022   Procedure: URETEROSCOPY AND STENT PLACEMENT;  Surgeon: Selma Donnice SAUNDERS, MD;  Location: [facility] OR;  Service: Urology;  Laterality: Left;   CYSTOSCOPY/URETEROSCOPY/HOLMIUM LASER/STENT PLACEMENT Right 09/30/2021   Procedure: CYSTOSCOPY/ RETROGRADE/URETEROSCOPY/HOLMIUM LASER/STENT PLACEMENT;  Surgeon: Selma Donnice SAUNDERS, MD;  Location: [facility];  Service: Urology;  Laterality: Right;   CYSTOSCOPY/URETEROSCOPY/HOLMIUM LASER/STENT PLACEMENT Left 09/12/2022   Procedure: CYSTOSCOPY/LEFT RETROGRADE  PYELOGRAM/LEFT URETEROSCOPY/HOLMIUM LASER/LEFT STENT EXCHANGE;  Surgeon: Selma Donnice SAUNDERS, MD;  Location: [facility];  Service: Urology;  Laterality: Left;  75 MINUTES NEEDED FOR CASE   CYSTOSCOPY/URETEROSCOPY/HOLMIUM LASER/STENT PLACEMENT Right 10/30/2023   Procedure: CYSTOSCOPY/URETEROSCOPY/HOLMIUM LASER/STENT PLACEMENT;  Surgeon: Selma Donnice SAUNDERS, MD;  Location: [facility];  Service: Urology;  Laterality: Right;   CYSTOSCOPY/URETEROSCOPY/HOLMIUM LASER/STENT PLACEMENT Bilateral 01/18/2024   Procedure: CYSTOSCOPY/URETEROSCOPY/HOLMIUM LASER/STENT PLACEMENT;  Surgeon: Selma Donnice SAUNDERS, MD;  Location: [facility];  Service: Urology;  Laterality: Bilateral;   IR RADIOLOGIST EVAL & MGMT  08/12/2016   IR RADIOLOGIST EVAL & MGMT  08/21/2016   KNEE ARTHROSCOPY     left    TOTAL KNEE ARTHROPLASTY  07/29/2011   Procedure: TOTAL KNEE ARTHROPLASTY;  Surgeon: Donnice JONETTA Car, MD;  Location: [facility];  Service: Orthopedics;  Laterality: Right;   TOTAL KNEE ARTHROPLASTY Left 12/14/2012   Procedure: LEFT TOTAL KNEE ARTHROPLASTY;  Surgeon: Donnice JONETTA Car, MD;  Location: [facility];  Service: Orthopedics;  Laterality: Left;    reports that she quit smoking about 40 years ago. Her smoking use included cigarettes. She started smoking about 47 years ago. She has a 3.5 pack-year smoking history. She has never been exposed to tobacco smoke. She has never used smokeless tobacco. She reports that she does not currently use alcohol. She reports that she does not currently use drugs after having used the following drugs: Marijuana. family history includes Alcoholism in her sister; COPD in her father; Lymphoma in her sister; Stroke in her mother. Allergies[1] Medications Ordered Prior to Encounter[2]      ROS:  All others reviewed and negative.  Objective        PE:  BP 130/80 (BP Location: Left Arm, Patient Position: Sitting, Cuff Size: Normal)   Pulse 68   Temp 97.8 F (36.6 C) (Oral)   Ht 5' 1 (1.549 m)   Wt 286 lb (129.7 kg)   SpO2 97%    BMI 54.04 kg/m                 Constitutional: Pt appears in NAD               HENT: Head: NCAT.                Right Ear: External ear normal.                 Left Ear: External ear normal.                Eyes: . Pupils are equal, round, and reactive to light. Conjunctivae and EOM are normal               Nose: without d/c or deformity               Neck: Neck supple. Gross normal ROM               Cardiovascular: Normal rate and regular rhythm.  Pulmonary/Chest: Effort normal and breath sounds without rales or wheezing.                Abd:  Soft, NT, ND, + BS, no organomegaly               Neurological: Pt is alert. At baseline orientation, motor grossly intact               Skin: Skin is warm. No rashes, no other new lesions, LE edema - none               Psychiatric: Pt behavior is normal without agitation   Micro: none  Cardiac tracings I have personally interpreted today:  none  Pertinent Radiological findings (summarize): none   Lab Results  Component Value Date   WBC 12.1 (H) 01/22/2024   HGB 11.1 (L) 01/22/2024   HCT 34.2 (L) 01/22/2024   PLT 268.0 01/22/2024   GLUCOSE 97 01/22/2024   CHOL 146 01/22/2024   TRIG 91.0 01/22/2024   HDL 48.80 01/22/2024   LDLDIRECT 107.0 02/23/2012   LDLCALC 79 01/22/2024   ALT 22 01/22/2024   AST 22 01/22/2024   NA 142 01/22/2024   K 3.9 01/22/2024   CL 105 01/22/2024   CREATININE 1.52 (H) 01/22/2024   BUN 28 (H) 01/22/2024   CO2 29 01/22/2024   TSH 3.50 01/22/2024   INR 1.1 01/14/2024   HGBA1C 6.0 01/22/2024   Assessment/Plan:  DESMA WILKOWSKI is a 63 y.o. Black or African American [2] female with  has a past medical history of Abscess of bladder (07/28/2016), Alcohol dependence (HCC), Allergic rhinitis (12/06/2013), Anxiety, Aortic atherosclerosis (01/15/2024), Asthma (03/16/2015), Atrial flutter with rapid ventricular response (HCC) (04/01/2020), COLONIC POLYPS, HX OF (04/05/2010), COVID-19 (09/2023),  DIVERTICULITIS, HX OF (04/05/2010), DJD (degenerative joint disease), Dysrhythmia, Fatty liver, GERD (gastroesophageal reflux disease), Heart murmur, Hemorrhoids, Hepatic steatosis (01/15/2024), History of kidney stones, Hyperlipidemia, Hypertension, Impaired glucose tolerance (12/06/2013), Kidney stones, Morbid obesity with BMI of 50.0-59.9, adult (HCC), Nausea & vomiting (05/24/2022), Peripheral vascular disease, Pulmonary embolism (HCC), and SVT (supraventricular tachycardia) (01/15/2024).  Vitamin D  deficiency Last vitamin D  Lab Results  Component Value Date   VD25OH <7.00 (L) 08/06/2023   Low, to start oral replacement   Impaired glucose tolerance Lab Results  Component Value Date   HGBA1C 6.0 01/22/2024   Stable, pt to continue current medical treatment  - diet,wt control   Hyperlipidemia Lab Results  Component Value Date   LDLCALC 79 01/22/2024   Stable, pt to continue current statin lipitor 20 mg qd   Essential hypertension BP Readings from Last 3 Encounters:  01/22/24 130/80  01/18/24 (!) 152/97  01/08/24 128/79   Stable, pt to continue medical treatment hyzaar 100 25 every day, toprol  xl 25 bid   Acute pyelonephritis Clinically resolved, pt to f/u urology as planned  Followup: Return in about 6 months (around 07/22/2024).  Lynwood Rush, MD 01/24/2024 2:25 PM  [facility] Medicine     [1]  Allergies Allergen Reactions   Morphine  Itching   Shrimp [Shellfish Allergy] Itching and Other (See Comments)    Tongue burns also   Tramadol  Other (See Comments)    Caused confusion   Covid-19 (Mrna) Vaccine Hives   Diltiazem  Hcl Itching    Pt with itching of the feet when bolus given   Latex Rash  [2]  Current Outpatient Medications on File Prior to Visit  Medication Sig Dispense Refill  calcium  carbonate (TUMS - DOSED IN MG ELEMENTAL CALCIUM ) 500 MG chewable tablet Chew 1 tablet by mouth daily as  needed for indigestion or heartburn.     Carboxymethylcellul-Glycerin (CLEAR EYES FOR DRY EYES OP) Place 1 drop into both eyes daily as needed (Dry eyes).     cefadroxil  (DURICEF) 500 MG capsule Take 2 capsules (1,000 mg total) by mouth 2 (two) times daily for 8 days. 32 capsule 0   metoprolol  succinate (TOPROL -XL) 25 MG 24 hr tablet Take 1 tablet (25 mg total) by mouth in the morning and at bedtime. 60 tablet 0   oxybutynin  (DITROPAN ) 5 MG tablet Take 5 mg by mouth every 8 (eight) hours as needed for bladder spasms.     oxyCODONE  (OXY IR/ROXICODONE ) 5 MG immediate release tablet Take 1 tablet (5 mg total) by mouth every 6 (six) hours as needed for severe pain (pain score 7-10). 30 tablet 0   rivaroxaban  (XARELTO ) 20 MG TABS tablet Take 1 tablet (20 mg total) by mouth daily with supper. Needs appt 30 tablet 0   triamcinolone  cream (KENALOG ) 0.1 % APPLY 1 APPLICATION TOPICALLY 2 (TWO) TIMES DAILY AS NEEDED (ITCHING). (Patient taking differently: Apply 1 Application topically 2 (two) times daily as needed (Itching).) 30 g 2   No current facility-administered medications on file prior to visit.

## 2024-01-22 NOTE — Patient Instructions (Signed)
 Please continue all other medications as before, and refills have been done  Please have the pharmacy call with any other refills you may need.  Please continue your efforts at being more active, low cholesterol diet, and weight control.  You are otherwise up to date with prevention measures today.  Please keep your appointments with your specialists as you may have planned  You will be contacted regarding the referral for: colonoscopy  Please go to the LAB at the blood drawing area for the tests to be done  You will be contacted by phone if any changes need to be made immediately.  Otherwise, you will receive a letter about your results with an explanation, but please check with MyChart first.  Please make an Appointment to return in 6 months, or sooner if needed

## 2024-01-24 ENCOUNTER — Encounter: Payer: Self-pay | Admitting: Internal Medicine

## 2024-01-24 NOTE — Assessment & Plan Note (Signed)
 BP Readings from Last 3 Encounters:  01/22/24 130/80  01/18/24 (!) 152/97  01/08/24 128/79   Stable, pt to continue medical treatment hyzaar 100 25 every day, toprol  xl 25 bid

## 2024-01-24 NOTE — Assessment & Plan Note (Signed)
 Clinically resolved, pt to f/u urology as planned

## 2024-01-24 NOTE — Assessment & Plan Note (Signed)
 Lab Results  Component Value Date   LDLCALC 79 01/22/2024   Stable, pt to continue current statin lipitor 20 mg qd

## 2024-01-24 NOTE — Assessment & Plan Note (Signed)
 Last vitamin D  Lab Results  Component Value Date   VD25OH <7.00 (L) 08/06/2023   Low, to start oral replacement

## 2024-01-24 NOTE — Assessment & Plan Note (Signed)
 Lab Results  Component Value Date   HGBA1C 6.0 01/22/2024   Stable, pt to continue current medical treatment  - diet,wt control

## 2024-02-02 NOTE — Patient Instructions (Addendum)
 SURGICAL WAITING ROOM VISITATION  Patients having surgery or a procedure may have no more than 2 support people in the waiting area - these visitors may rotate.    Children ages 62 and under will not be able to visit patients in [facility] under most circumstances.   Visitors with respiratory illnesses are discouraged from visiting and should remain at home.  If the patient needs to stay at the hospital during part of their recovery, the visitor guidelines for inpatient rooms apply. Pre-op nurse will coordinate an appropriate time for 1 support person to accompany patient in pre-op.  This support person may not rotate.    Please refer to the [facility] website for the visitor guidelines for Inpatients (after your surgery is over and you are in a regular room).       Your procedure is scheduled on: 02/12/24   Report to [facility] Main Entrance    Report to admitting at 7:15 AM   Call this number if you have problems the morning of surgery [phone]   Do not eat food or drink liquids :After Midnight.but may have sips of water to take meds.     Oral Hygiene is also important to reduce your risk of infection.                                    Remember - BRUSH YOUR TEETH THE MORNING OF SURGERY WITH YOUR REGULAR TOOTHPASTE    Stop all vitamins and herbal supplements 7 days before surgery.   Take these medicines the morning of surgery with A SIP OF WATER: atorvastatin , famotidine , lorazepam , metoprolol , ondansetron (zofran ), oxycodone  if needed, inhaler.              Stop Xarelto  3 days prior to surgery. Last dose to be 02/08/24.             You may not have any metal on your body including hair pins, jewelry, and body piercing             Do not wear make-up, lotions, powders, perfumes/cologne, or deodorant  Do not wear nail polish including gel and S&S, artificial/acrylic nails, or any other type of covering on natural nails including finger and toenails. If  you have artificial nails, gel coating, etc. that needs to be removed by a nail salon please have this removed prior to surgery or surgery may need to be canceled/ delayed if the surgeon/ anesthesia feels like they are unable to be safely monitored.   Do not shave  48 hours prior to surgery.                 Do not bring valuables to the hospital. [place] IS NOT             RESPONSIBLE   FOR VALUABLES.   Contacts, glasses, dentures or bridgework may not be worn into surgery.   DO NOT BRING YOUR HOME MEDICATIONS TO THE HOSPITAL. PHARMACY WILL DISPENSE MEDICATIONS LISTED ON YOUR MEDICATION LIST TO YOU DURING YOUR ADMISSION IN THE HOSPITAL!    Patients discharged on the day of surgery will not be allowed to drive home.  Someone NEEDS to stay with you for the first 24 hours after anesthesia.   Special Instructions: Bring a copy of your healthcare power of attorney and living will documents the day of surgery if you haven't scanned them before.  Please read over the following fact sheets you were given: IF YOU HAVE QUESTIONS ABOUT YOUR PRE-OP INSTRUCTIONS PLEASE CALL [phone] [place]   If you received a COVID test during your pre-op visit  it is requested that you wear a mask when out in public, stay away from anyone that may not be feeling well and notify your surgeon if you develop symptoms. If you test positive for Covid or have been in contact with anyone that has tested positive in the last 10 days please notify you surgeon.    [facility] - Preparing for Surgery Before surgery, you can play an important role.  Because skin is not sterile, your skin needs to be as free of germs as possible.  You can reduce the number of germs on your skin by washing with CHG (chlorahexidine gluconate) soap before surgery.  CHG is an antiseptic cleaner which kills germs and bonds with the skin to continue killing germs even after washing. Please DO NOT use if you have an allergy to CHG or  antibacterial soaps.  If your skin becomes reddened/irritated stop using the CHG and inform your nurse when you arrive at Short Stay. Do not shave (including legs and underarms) for at least 48 hours prior to the first CHG shower.  You may shave your face/neck.  Please follow these instructions carefully:  1.  Shower with CHG Soap the night before surgery and the morning of surgery.  2.  If you choose to wash your hair, wash your hair first as usual with your normal  shampoo.  3.  After you shampoo, rinse your hair and body thoroughly to remove the shampoo.                             4.  Use CHG as you would any other liquid soap.  You can apply chg directly to the skin and wash.  Gently with a scrungie or clean washcloth.  5.  Apply the CHG Soap to your body ONLY FROM THE NECK DOWN.   Do   not use on face/ open                           Wound or open sores. Avoid contact with eyes, ears mouth and   genitals (private parts).                       Wash face,  Genitals (private parts) with your normal soap.             6.  Wash thoroughly, paying special attention to the area where your    surgery  will be performed.  7.  Thoroughly rinse your body with warm water from the neck down.  8.  DO NOT shower/wash with your normal soap after using and rinsing off the CHG Soap.                9.  Pat yourself dry with a clean towel.            10.  Wear clean pajamas.            11.  Place clean sheets on your bed the night of your first shower and do not  sleep with pets. Day of Surgery : Do not apply any CHG, lotions/deodorants the morning of surgery.  Please wear clean clothes to the hospital/surgery center.  FAILURE TO FOLLOW THESE INSTRUCTIONS MAY RESULT IN THE CANCELLATION OF YOUR SURGERY  PATIENT SIGNATURE_________________________________  NURSE SIGNATURE__________________________________  ________________________________________________________________________

## 2024-02-02 NOTE — Progress Notes (Addendum)
 COVID Vaccine received:  []  No [x]  Yes Date of any COVID positive Test in last 90 days: unsure PCP - Dr. Lynwood Rush Cardiologist - At [facility]  Chest x-ray - 01/13/24 Epic EKG -  01/15/24 Epic Stress Test -  ECHO - 01/15/24 Epic Cardiac Cath -   Bowel Prep - [x]  No  []   Yes ______  Pacemaker / ICD device [x]  No []  Yes   Spinal Cord Stimulator:[x]  No []  Yes       History of Sleep Apnea? [x]  No []  Yes   CPAP used?- [x]  No []  Yes    Does the patient monitor blood sugar?          [x]  No []  Yes  []  N/A  Patient has: []  NO Hx DM   [x]  Pre-DM                 []  DM1  []   DM2 Does patient have a [employer] or Dexacom? []  No []  Yes   Fasting Blood Sugar Ranges-  Checks Blood Sugar _____ times a day  GLP1 agonist / usual dose - no GLP1 instructions:  SGLT-2 inhibitors / usual dose - no SGLT-2 instructions:   Blood Thinner / Instructions:Xarelto . Stop 3 days prior to surgery. Last dose to be 02/08/24 with morning meds. Aspirin  Instructions:  Comments:   Activity level: Patient is able to climb a flight of stairs without difficulty; [x]  No CP  [x]  No SOB,   Patient can perform ADLs without assistance.   Anesthesia review: CKD, HTN, Hx. PE, murmur, A-fib, on Xarelto , Prediabetes, Hospitalized 12/3-12/8  Patient denies shortness of breath, fever, cough and chest pain at PAT appointment.  Patient verbalized understanding and agreement to the Pre-Surgical Instructions that were given to them at this PAT appointment. Patient was also educated of the need to review these PAT instructions again prior to his/her surgery.I reviewed the appropriate phone numbers to call if they have any and questions or concerns.

## 2024-02-05 ENCOUNTER — Other Ambulatory Visit: Payer: Self-pay

## 2024-02-05 ENCOUNTER — Encounter (HOSPITAL_COMMUNITY)
Admission: RE | Admit: 2024-02-05 | Discharge: 2024-02-05 | Disposition: A | Discharge status: Home / Self Care | Source: Ambulatory Visit | Attending: Urology | Admitting: Urology

## 2024-02-05 ENCOUNTER — Encounter (HOSPITAL_COMMUNITY): Payer: Self-pay

## 2024-02-05 DIAGNOSIS — I4892 Unspecified atrial flutter: Secondary | ICD-10-CM | POA: Insufficient documentation

## 2024-02-05 DIAGNOSIS — Z6841 Body Mass Index (BMI) 40.0 and over, adult: Secondary | ICD-10-CM | POA: Diagnosis not present

## 2024-02-05 DIAGNOSIS — I471 Supraventricular tachycardia, unspecified: Secondary | ICD-10-CM | POA: Diagnosis not present

## 2024-02-05 DIAGNOSIS — F1011 Alcohol abuse, in remission: Secondary | ICD-10-CM | POA: Insufficient documentation

## 2024-02-05 DIAGNOSIS — N201 Calculus of ureter: Secondary | ICD-10-CM | POA: Insufficient documentation

## 2024-02-05 DIAGNOSIS — K219 Gastro-esophageal reflux disease without esophagitis: Secondary | ICD-10-CM | POA: Insufficient documentation

## 2024-02-05 DIAGNOSIS — E669 Obesity, unspecified: Secondary | ICD-10-CM | POA: Diagnosis not present

## 2024-02-05 DIAGNOSIS — N183 Chronic kidney disease, stage 3 unspecified: Secondary | ICD-10-CM | POA: Insufficient documentation

## 2024-02-05 DIAGNOSIS — Z01818 Encounter for other preprocedural examination: Secondary | ICD-10-CM | POA: Insufficient documentation

## 2024-02-05 DIAGNOSIS — J45909 Unspecified asthma, uncomplicated: Secondary | ICD-10-CM | POA: Diagnosis not present

## 2024-02-05 DIAGNOSIS — Z01812 Encounter for preprocedural laboratory examination: Secondary | ICD-10-CM | POA: Diagnosis present

## 2024-02-05 DIAGNOSIS — Z87891 Personal history of nicotine dependence: Secondary | ICD-10-CM | POA: Insufficient documentation

## 2024-02-05 DIAGNOSIS — Z7901 Long term (current) use of anticoagulants: Secondary | ICD-10-CM | POA: Diagnosis not present

## 2024-02-05 DIAGNOSIS — I129 Hypertensive chronic kidney disease with stage 1 through stage 4 chronic kidney disease, or unspecified chronic kidney disease: Secondary | ICD-10-CM | POA: Diagnosis not present

## 2024-02-05 DIAGNOSIS — Z86711 Personal history of pulmonary embolism: Secondary | ICD-10-CM | POA: Diagnosis not present

## 2024-02-08 NOTE — Progress Notes (Signed)
 " Case: [id number] Date/Time: 02/12/24 0915   Procedures:      CYSTOSCOPY/URETEROSCOPY/HOLMIUM LASER/STENT PLACEMENT (Bilateral)     CYSTOSCOPY, WITH RETROGRADE PYELOGRAM (Bilateral)   Anesthesia type: General   Diagnosis: Ureteral stone [N20.1]   Pre-op diagnosis: BILATERAL URETERAL STONES   Location: [facility] PROC[facility] / [facility]   Surgeons: Selma Donnice SAUNDERS, MD       DISCUSSION: [name] is a 63 yo female with PMH of former smoking, HTN, A flutter on Xarelto , SVT, NSVT, hx of PE, asthma, GERD, hx of ETOH abuse, CKD3, obesity (BMI 52)   Admitted from 8/24-8/28/2025 with sepsis secondary to UTI, underwent cystoscopy with stent placement.    OR for cystoscopy, ureteroscopy, lithotripsy and stent exchange on 9/19. No complications noted.   Admitted again from 10/5-10/9 for urosepsis from another obstructing stone. Went to the OR 10/5 for bilateral stent placement. Patient discharged with PICC line and ID follow up.   Seen by ID on 10/24. She finished a course of ertapenem  via PICC line. Ciprofloxacin  prescribed for suppression therapy to prevent recurrent UTI until stone removal.   Was scheduled for surgery on 12/5 but readmitted from 12/3-12/8/25 for urosepsis again and issues with SVT and NSVT. She was seen by Cardiology inpatient. Echo was repeated and showed mildly depressed LVEF to 45-50%, grade I DD. Her beta blocker dose was increased. She was cleared for surgery as low risk. She underwent surgery on 12/8 with Dr. Selma. Now scheduled for 2nd look.  Seen by PCP in follow up on 12/12. Pt noted to be stable. Labs showed mildly worse CKD and she was referred to Nephrology. Advised 6 month f/u.     LD Xarelto : 12/29  VS: BP 137/79   Pulse 80   Temp 36.6 C (Oral)   Resp 16   Ht 5' 1 (1.549 m)   Wt 129.3 kg   SpO2 99%   BMI 53.85 kg/m   PROVIDERS: Norleen Lynwood ORN, MD Cardiologist - Fairy Heinrich, MD Pulmonologist- Ozell America, MD  LABS: Labs reviewed: Acceptable for  surgery. (all labs ordered are listed, but only abnormal results are displayed)  Labs Reviewed - No data to display   IMAGES:   EKG - last EKG on 12/3 showed SVT. Consider repeat DOS   Echo 01/15/24:  IMPRESSIONS    1. Left ventricular ejection fraction, by estimation, is 45 to 50%. The left ventricle has mildly decreased function. The left ventricle demonstrates global hypokinesis. There is mild left ventricular hypertrophy. Left ventricular diastolic parameters are consistent with Grade I diastolic dysfunction (impaired relaxation). Elevated left atrial pressure.  2. Right ventricular systolic function is normal. The right ventricular size is normal.  3. Left atrial size was mildly dilated.  4. The mitral valve is normal in structure. No evidence of mitral valve regurgitation. No evidence of mitral stenosis.  5. The aortic valve has an indeterminant number of cusps. Aortic valve regurgitation is trivial. No aortic stenosis is present.  6. The inferior vena cava is normal in size with greater than 50% respiratory variability, suggesting right atrial pressure of 3 mmHg Past Medical History:  Diagnosis Date   Abscess of bladder 07/28/2016   Alcohol dependence (HCC)    Allergic rhinitis 12/06/2013   Anxiety    Aortic atherosclerosis 01/15/2024   Asthma 03/16/2015   Atrial flutter with rapid ventricular response (HCC) 04/01/2020   COLONIC POLYPS, HX OF 04/05/2010   COVID-19 09/2023   DIVERTICULITIS, HX OF 04/05/2010   DJD (degenerative joint  disease)    right knee, mot to severe   Dysrhythmia    Fatty liver    GERD (gastroesophageal reflux disease)    no meds   Heart murmur    hx of    Hemorrhoids    Hepatic steatosis 01/15/2024   History of kidney stones    Hyperlipidemia    Hypertension    Impaired glucose tolerance 12/06/2013   Kidney stones    Morbid obesity with BMI of 50.0-59.9, adult (HCC)    Nausea & vomiting 05/24/2022   Peripheral vascular disease     Pulmonary embolism (HCC)    SVT (supraventricular tachycardia) 01/15/2024    Past Surgical History:  Procedure Laterality Date   ABDOMINAL HYSTERECTOMY  age 66   fibroids   BREAST BIOPSY Left    COLONOSCOPY WITH PROPOFOL  N/A 07/25/2016   Procedure: COLONOSCOPY WITH PROPOFOL ;  Surgeon: Rollin Dover, MD;  [facility];  Service: Endoscopy;  Laterality: N/A;   colonscopy     x 2   CYSTOSCOPY W/ RETROGRADES Right 10/30/2023   Procedure: CYSTOSCOPY, WITH RETROGRADE PYELOGRAM;  Surgeon: Selma Donnice SAUNDERS, MD;  [facility];  Service: Urology;  Laterality: Right;   CYSTOSCOPY W/ URETERAL STENT PLACEMENT Right 09/05/2021   Procedure: CYSTOSCOPY WITH RETROGRADE PYELOGRAM/URETERAL STENT PLACEMENT;  Surgeon: Selma Donnice SAUNDERS, MD;  [facility];  Service: Urology;  Laterality: Right;   CYSTOSCOPY W/ URETERAL STENT PLACEMENT Right 10/04/2023   Procedure: CYSTOSCOPY, WITH RETROGRADE PYELOGRAM AND URETERAL STENT INSERTION;  Surgeon: Shane Steffan BROCKS, MD;  Location: [facility] OR;  Service: Urology;  Laterality: Right;   CYSTOSCOPY W/ URETERAL STENT PLACEMENT Right 11/15/2023   Procedure: CYSTOSCOPY, WITH RETROGRADE PYELOGRAM AND URETERAL STENT INSERTION;  Surgeon: Selma Donnice SAUNDERS, MD;  Location: [facility] OR;  Service: Urology;  Laterality: Right;   CYSTOSCOPY WITH RETROGRADE PYELOGRAM, URETEROSCOPY AND STENT PLACEMENT Left 08/14/2022   Procedure: CYSTOSCOPY WITH RETROGRADE PYELOGRAM;  Surgeon: Selma Donnice SAUNDERS, MD;  Location: [facility] OR;  Service: Urology;  Laterality: Left;   CYSTOSCOPY WITH URETEROSCOPY AND STENT PLACEMENT Left 08/14/2022   Procedure: URETEROSCOPY AND STENT PLACEMENT;  Surgeon: Selma Donnice SAUNDERS, MD;  Location: [facility] OR;  Service: Urology;  Laterality: Left;   CYSTOSCOPY/URETEROSCOPY/HOLMIUM LASER/STENT PLACEMENT Right 09/30/2021   Procedure: CYSTOSCOPY/ RETROGRADE/URETEROSCOPY/HOLMIUM LASER/STENT PLACEMENT;  Surgeon: Selma Donnice SAUNDERS, MD;  [facility];  Service: Urology;  Laterality: Right;    CYSTOSCOPY/URETEROSCOPY/HOLMIUM LASER/STENT PLACEMENT Left 09/12/2022   Procedure: CYSTOSCOPY/LEFT RETROGRADE PYELOGRAM/LEFT URETEROSCOPY/HOLMIUM LASER/LEFT STENT EXCHANGE;  Surgeon: Selma Donnice SAUNDERS, MD;  [facility];  Service: Urology;  Laterality: Left;  75 MINUTES NEEDED FOR CASE   CYSTOSCOPY/URETEROSCOPY/HOLMIUM LASER/STENT PLACEMENT Right 10/30/2023   Procedure: CYSTOSCOPY/URETEROSCOPY/HOLMIUM LASER/STENT PLACEMENT;  Surgeon: Selma Donnice SAUNDERS, MD;  [facility];  Service: Urology;  Laterality: Right;   CYSTOSCOPY/URETEROSCOPY/HOLMIUM LASER/STENT PLACEMENT Bilateral 01/18/2024   Procedure: CYSTOSCOPY/URETEROSCOPY/HOLMIUM LASER/STENT PLACEMENT;  Surgeon: Selma Donnice SAUNDERS, MD;  [facility];  Service: Urology;  Laterality: Bilateral;   IR RADIOLOGIST EVAL & MGMT  08/12/2016   IR RADIOLOGIST EVAL & MGMT  08/21/2016   KNEE ARTHROSCOPY     left    TOTAL KNEE ARTHROPLASTY  07/29/2011   Procedure: TOTAL KNEE ARTHROPLASTY;  Surgeon: Donnice JONETTA Car, MD;  [facility];  Service: Orthopedics;  Laterality: Right;   TOTAL KNEE ARTHROPLASTY Left 12/14/2012   Procedure: LEFT TOTAL KNEE ARTHROPLASTY;  Surgeon: Donnice JONETTA Car, MD;  [facility];  Service: Orthopedics;  Laterality: Left;    MEDICATIONS:  albuterol  (VENTOLIN  HFA)  108 (90 Base) MCG/ACT inhaler   atorvastatin  (LIPITOR) 20 MG tablet   cefadroxil  (DURICEF) 500 MG capsule   famotidine  (PEPCID ) 20 MG tablet   LORazepam  (ATIVAN ) 0.5 MG tablet   losartan -hydrochlorothiazide  (HYZAAR) 100-25 MG tablet   metoprolol  succinate (TOPROL -XL) 25 MG 24 hr tablet   ondansetron  (ZOFRAN -ODT) 4 MG disintegrating tablet   oxybutynin  (DITROPAN ) 5 MG tablet   oxyCODONE  (OXY IR/ROXICODONE ) 5 MG immediate release tablet   potassium chloride  (KLOR-CON  10) 10 MEQ tablet   rivaroxaban  (XARELTO ) 20 MG TABS tablet   triamcinolone  cream (KENALOG ) 0.1 %   No current facility-administered medications for this encounter.   Burnard CHRISTELLA Odis DEVONNA [facility]  Surgical Short Stay/Anesthesiology [facility] Phone [phone] 02/08/2024 8:56 AM        "

## 2024-02-08 NOTE — Anesthesia Preprocedure Evaluation (Signed)
"                                    Anesthesia Evaluation    Airway        Dental   Pulmonary former smoker          Cardiovascular hypertension,      Neuro/Psych    GI/Hepatic   Endo/Other    Renal/GU      Musculoskeletal   Abdominal   Peds  Hematology   Anesthesia Other Findings   Reproductive/Obstetrics                              Anesthesia Physical Anesthesia Plan  ASA:   Anesthesia Plan:    Post-op Pain Management:    Induction:   PONV Risk Score and Plan:   Airway Management Planned:   Additional Equipment:   Intra-op Plan:   Post-operative Plan:   Informed Consent:   Plan Discussed with:   Anesthesia Plan Comments: (See PAT note from 12/26)         Anesthesia Quick Evaluation  "

## 2024-02-12 ENCOUNTER — Encounter: Admission: RE | Payer: Self-pay

## 2024-02-12 ENCOUNTER — Ambulatory Visit (HOSPITAL_COMMUNITY): Admission: RE | Admit: 2024-02-12 | Payer: Self-pay | Source: Home / Self Care | Admitting: Urology

## 2024-02-12 ENCOUNTER — Encounter (HOSPITAL_COMMUNITY): Payer: Self-pay | Admitting: Medical

## 2024-02-12 SURGERY — CYSTOSCOPY/URETEROSCOPY/HOLMIUM LASER/STENT PLACEMENT
Anesthesia: General | Laterality: Bilateral

## 2024-02-16 ENCOUNTER — Other Ambulatory Visit: Payer: Self-pay | Admitting: Urology

## 2024-02-23 NOTE — Progress Notes (Signed)
 Updated date of surgery: 02/26/24  Updated time of arrival: 11:15 AM  NPO after midnight  Last dose of Xarelto  02/23/24 at 6:00 PM  Patient will be discharged from hospital and monitored at home for 24 hours by: [name] Nicholaus Raddle (son) [phone]  Patient denies any changes in allergies, medications, medical history since pre op appointment on: 02/05/24  Pre op instructions reviewed, follow up questions addressed and patient verbalized understanding at this time.

## 2024-02-26 ENCOUNTER — Other Ambulatory Visit: Payer: Self-pay

## 2024-02-26 ENCOUNTER — Other Ambulatory Visit (HOSPITAL_COMMUNITY): Payer: Self-pay

## 2024-02-26 ENCOUNTER — Ambulatory Visit (HOSPITAL_COMMUNITY): Payer: Self-pay | Admitting: Anesthesiology

## 2024-02-26 ENCOUNTER — Encounter (HOSPITAL_COMMUNITY): Payer: Self-pay | Admitting: Urology

## 2024-02-26 ENCOUNTER — Ambulatory Visit (HOSPITAL_COMMUNITY): Payer: Self-pay

## 2024-02-26 ENCOUNTER — Ambulatory Visit (HOSPITAL_COMMUNITY)
Admission: RE | Admit: 2024-02-26 | Discharge: 2024-02-26 | Disposition: A | Discharge status: Home / Self Care | Payer: Self-pay | Attending: Urology | Admitting: Urology

## 2024-02-26 ENCOUNTER — Encounter (HOSPITAL_COMMUNITY): Admission: RE | Disposition: A | Payer: Self-pay | Source: Home / Self Care | Attending: Urology

## 2024-02-26 DIAGNOSIS — Z87891 Personal history of nicotine dependence: Secondary | ICD-10-CM

## 2024-02-26 DIAGNOSIS — F419 Anxiety disorder, unspecified: Secondary | ICD-10-CM | POA: Insufficient documentation

## 2024-02-26 DIAGNOSIS — K219 Gastro-esophageal reflux disease without esophagitis: Secondary | ICD-10-CM | POA: Insufficient documentation

## 2024-02-26 DIAGNOSIS — J45909 Unspecified asthma, uncomplicated: Secondary | ICD-10-CM

## 2024-02-26 DIAGNOSIS — I10 Essential (primary) hypertension: Secondary | ICD-10-CM | POA: Insufficient documentation

## 2024-02-26 DIAGNOSIS — Z79899 Other long term (current) drug therapy: Secondary | ICD-10-CM | POA: Insufficient documentation

## 2024-02-26 DIAGNOSIS — N200 Calculus of kidney: Secondary | ICD-10-CM

## 2024-02-26 DIAGNOSIS — F32A Depression, unspecified: Secondary | ICD-10-CM | POA: Insufficient documentation

## 2024-02-26 DIAGNOSIS — I739 Peripheral vascular disease, unspecified: Secondary | ICD-10-CM | POA: Insufficient documentation

## 2024-02-26 DIAGNOSIS — Z7901 Long term (current) use of anticoagulants: Secondary | ICD-10-CM | POA: Insufficient documentation

## 2024-02-26 HISTORY — PX: CYSTOSCOPY W/ RETROGRADES: SHX1426

## 2024-02-26 HISTORY — PX: CYSTOSCOPY/URETEROSCOPY/HOLMIUM LASER/STENT PLACEMENT: SHX6546

## 2024-02-26 MED ORDER — FENTANYL CITRATE (PF) 100 MCG/2ML IJ SOLN
INTRAMUSCULAR | Status: DC | PRN
  Administered 2024-02-26 (×2): 50 ug via INTRAVENOUS

## 2024-02-26 MED ORDER — MIDAZOLAM HCL 5 MG/5ML IJ SOLN
INTRAMUSCULAR | Status: DC | PRN
  Administered 2024-02-26: 2 mg via INTRAVENOUS

## 2024-02-26 MED ORDER — PHENYLEPHRINE HCL (PRESSORS) 10 MG/ML IV SOLN
INTRAVENOUS | Status: DC | PRN
  Administered 2024-02-26: 80 ug via INTRAVENOUS

## 2024-02-26 MED ORDER — CEFAZOLIN SODIUM-DEXTROSE 3-4 GM/150ML-% IV SOLN
INTRAVENOUS | Status: AC
  Filled 2024-02-26: qty 150

## 2024-02-26 MED ORDER — DEXAMETHASONE SOD PHOSPHATE PF 10 MG/ML IJ SOLN
INTRAMUSCULAR | Status: DC | PRN
  Administered 2024-02-26: 5 mg via INTRAVENOUS

## 2024-02-26 MED ORDER — CHLORHEXIDINE GLUCONATE 0.12 % MT SOLN
15.0000 mL | Freq: Once | OROMUCOSAL | Status: AC
  Administered 2024-02-26: 15 mL via OROMUCOSAL

## 2024-02-26 MED ORDER — ONDANSETRON HCL 4 MG/2ML IJ SOLN
INTRAMUSCULAR | Status: DC | PRN
  Administered 2024-02-26: 4 mg via INTRAVENOUS

## 2024-02-26 MED ORDER — SUGAMMADEX SODIUM 200 MG/2ML IV SOLN
INTRAVENOUS | Status: AC
  Filled 2024-02-26: qty 4

## 2024-02-26 MED ORDER — ONDANSETRON HCL 4 MG/2ML IJ SOLN
INTRAMUSCULAR | Status: AC
  Filled 2024-02-26: qty 2

## 2024-02-26 MED ORDER — ACETAMINOPHEN 500 MG PO TABS
1000.0000 mg | ORAL_TABLET | Freq: Once | ORAL | Status: DC
  Filled 2024-02-26: qty 2

## 2024-02-26 MED ORDER — DEXAMETHASONE SOD PHOSPHATE PF 10 MG/ML IJ SOLN
INTRAMUSCULAR | Status: AC
  Filled 2024-02-26: qty 1

## 2024-02-26 MED ORDER — DEXMEDETOMIDINE HCL IN NACL 200 MCG/50ML IV SOLN: INTRAVENOUS | Status: DC | PRN

## 2024-02-26 MED ORDER — SODIUM CHLORIDE 0.9 % IR SOLN
Status: DC | PRN
  Administered 2024-02-26: 3000 mL via INTRAVESICAL

## 2024-02-26 MED ORDER — ROCURONIUM BROMIDE 100 MG/10ML IV SOLN
INTRAVENOUS | Status: DC | PRN
  Administered 2024-02-26: 10 mg via INTRAVENOUS
  Administered 2024-02-26: 50 mg via INTRAVENOUS

## 2024-02-26 MED ORDER — ACETAMINOPHEN 10 MG/ML IV SOLN
INTRAVENOUS | Status: AC
  Filled 2024-02-26: qty 100

## 2024-02-26 MED ORDER — LACTATED RINGERS IV SOLN: INTRAVENOUS | Status: DC

## 2024-02-26 MED ORDER — LACTATED RINGERS IV SOLN: INTRAVENOUS | Status: DC | PRN

## 2024-02-26 MED ORDER — SUCCINYLCHOLINE CHLORIDE 200 MG/10ML IV SOSY
PREFILLED_SYRINGE | INTRAVENOUS | Status: DC | PRN
  Administered 2024-02-26: 180 mg via INTRAVENOUS

## 2024-02-26 MED ORDER — PROPOFOL 10 MG/ML IV BOLUS
INTRAVENOUS | Status: AC
  Filled 2024-02-26: qty 20

## 2024-02-26 MED ORDER — IOHEXOL 300 MG/ML  SOLN
INTRAMUSCULAR | Status: DC | PRN
  Administered 2024-02-26: 6 mL

## 2024-02-26 MED ORDER — PROPOFOL 500 MG/50ML IV EMUL
INTRAVENOUS | Status: DC | PRN
  Administered 2024-02-26: 25 ug/kg/min via INTRAVENOUS

## 2024-02-26 MED ORDER — PROPOFOL 10 MG/ML IV BOLUS
INTRAVENOUS | Status: DC | PRN
  Administered 2024-02-26: 200 mg via INTRAVENOUS

## 2024-02-26 MED ORDER — DEXMEDETOMIDINE HCL IN NACL 80 MCG/20ML IV SOLN
INTRAVENOUS | Status: DC | PRN
  Administered 2024-02-26: 8 ug via INTRAVENOUS

## 2024-02-26 MED ORDER — CEFAZOLIN SODIUM-DEXTROSE 2-4 GM/100ML-% IV SOLN
2.0000 g | INTRAVENOUS | Status: AC
  Administered 2024-02-26: 3 g via INTRAVENOUS
  Filled 2024-02-26: qty 100

## 2024-02-26 MED ORDER — FENTANYL CITRATE (PF) 100 MCG/2ML IJ SOLN
INTRAMUSCULAR | Status: AC
  Filled 2024-02-26: qty 2

## 2024-02-26 MED ORDER — SUGAMMADEX SODIUM 200 MG/2ML IV SOLN
INTRAVENOUS | Status: DC | PRN
  Administered 2024-02-26: 400 mg via INTRAVENOUS

## 2024-02-26 MED ORDER — LIDOCAINE HCL (CARDIAC) PF 100 MG/5ML IV SOSY
PREFILLED_SYRINGE | INTRAVENOUS | Status: DC | PRN
  Administered 2024-02-26: 100 mg via INTRAVENOUS

## 2024-02-26 MED ORDER — OXYCODONE HCL 5 MG PO TABS
5.0000 mg | ORAL_TABLET | Freq: Four times a day (QID) | ORAL | 0 refills | Status: AC | PRN
  Filled 2024-02-26: qty 18, 5d supply, fill #0

## 2024-02-26 MED ORDER — MIDAZOLAM HCL 2 MG/2ML IJ SOLN
INTRAMUSCULAR | Status: AC
  Filled 2024-02-26: qty 2

## 2024-02-26 MED ORDER — ORAL CARE MOUTH RINSE: 15.0000 mL | Freq: Once | OROMUCOSAL | Status: AC

## 2024-02-26 NOTE — Discharge Instructions (Signed)
 [facility] [phone] Post Ureteroscopy With or Without Stent Instructions  Definitions:  Ureter: The duct that transports urine from the kidney to the bladder. Stent:   A plastic hollow tube that is placed into the ureter, from the kidney to the bladder to prevent the ureter from swelling shut.  GENERAL INSTRUCTIONS:  Despite the fact that no skin incisions were used, the area around the ureter and bladder is raw and irritated. The stent is a foreign body which will further irritate the bladder wall. This irritation is manifested by increased frequency of urination, both day and night, and by an increase in the urge to urinate. In some, the urge to urinate is present almost always. Sometimes the urge is strong enough that you may not be able to stop yourself from urinating. The only real cure is to remove the stent and then give time for the bladder wall to heal which can't be done until the danger of the ureter swelling shut has passed, which varies.  You may see some blood in your urine while the stent is in place and a few days afterwards. Do not be alarmed, even if the urine was clear for a while. Get off your feet and drink lots of fluids until clearing occurs. If you start to pass clots or don't improve, call us .  DIET: You may return to your normal diet immediately. Because of the raw surface of your bladder, alcohol, spicy foods, acid type foods and drinks with caffeine  may cause irritation or frequency and should be used in moderation. To keep your urine flowing freely and to avoid constipation, drink plenty of fluids during the day ( 8-10 glasses ). Tip: Avoid cranberry juice because it is very acidic.  ACTIVITY: Your physical activity doesn't need to be restricted. However, if you are very active, you may see some blood in your urine. We suggest that you reduce your activity under these circumstances until the bleeding has stopped.  BOWELS: It is important to  keep your bowels regular during the postoperative period. Straining with bowel movements can cause bleeding. A bowel movement every other day is reasonable. Use a mild laxative if needed, such as Milk of Magnesia 2-3 tablespoons, or 2 Dulcolax tablets. Call if you continue to have problems. If you have been taking narcotics for pain, before, during or after your surgery, you may be constipated. Take a laxative if necessary.   MEDICATION: You should resume your pre-surgery medications unless told not to. In addition you will often be given an antibiotic to prevent infection. These should be taken as prescribed until the bottles are finished unless you are having an unusual reaction to one of the drugs.  PROBLEMS YOU SHOULD REPORT TO US : Fevers over 100.5 Fahrenheit. Heavy bleeding, or clots ( See above notes about blood in urine ). Inability to urinate. Drug reactions ( hives, rash, nausea, vomiting, diarrhea ). Severe burning or pain with urination that is not improving.  FOLLOW-UP: You will need a follow-up appointment to monitor your progress. Call for this appointment at the number listed above. Usually the first appointment will be about three to fourteen days after your surgery.  You have stents draining your kidneys. These may be removed on Monday by pulling on attached strings.

## 2024-02-26 NOTE — H&P (Signed)
 Urology Preoperative H&P   Chief Complaint: Bilateral renal stones  History of Present Illness: [name] is a 64 y.o. female with bilateral renal stones here for staged bilateral ureteroscopy with laser lithotripsy and basket extraction of stones. Denies fevers, chills, dysuria.    Past Medical History:  Diagnosis Date   Abscess of bladder 07/28/2016   Alcohol dependence (HCC)    Allergic rhinitis 12/06/2013   Anxiety    Aortic atherosclerosis 01/15/2024   Asthma 03/16/2015   Atrial flutter with rapid ventricular response (HCC) 04/01/2020   COLONIC POLYPS, HX OF 04/05/2010   COVID-19 09/2023   DIVERTICULITIS, HX OF 04/05/2010   DJD (degenerative joint disease)    right knee, mot to severe   Dysrhythmia    Fatty liver    GERD (gastroesophageal reflux disease)    no meds   Heart murmur    hx of    Hemorrhoids    Hepatic steatosis 01/15/2024   History of kidney stones    Hyperlipidemia    Hypertension    Impaired glucose tolerance 12/06/2013   Kidney stones    Morbid obesity with BMI of 50.0-59.9, adult (HCC)    Nausea & vomiting 05/24/2022   Peripheral vascular disease    Pulmonary embolism (HCC)    SVT (supraventricular tachycardia) 01/15/2024    Past Surgical History:  Procedure Laterality Date   ABDOMINAL HYSTERECTOMY  age 60   fibroids   BREAST BIOPSY Left    COLONOSCOPY WITH PROPOFOL  N/A 07/25/2016   Procedure: COLONOSCOPY WITH PROPOFOL ;  Surgeon: Rollin Dover, MD;  Location: [facility];  Service: Endoscopy;  Laterality: N/A;   colonscopy     x 2   CYSTOSCOPY W/ RETROGRADES Right 10/30/2023   Procedure: CYSTOSCOPY, WITH RETROGRADE PYELOGRAM;  Surgeon: Selma Donnice SAUNDERS, MD;  Location: [facility];  Service: Urology;  Laterality: Right;   CYSTOSCOPY W/ URETERAL STENT PLACEMENT Right 09/05/2021   Procedure: CYSTOSCOPY WITH RETROGRADE PYELOGRAM/URETERAL STENT PLACEMENT;  Surgeon: Selma Donnice SAUNDERS, MD;  Location: [facility];  Service: Urology;  Laterality: Right;    CYSTOSCOPY W/ URETERAL STENT PLACEMENT Right 10/04/2023   Procedure: CYSTOSCOPY, WITH RETROGRADE PYELOGRAM AND URETERAL STENT INSERTION;  Surgeon: Shane Steffan BROCKS, MD;  Location: [facility];  Service: Urology;  Laterality: Right;   CYSTOSCOPY W/ URETERAL STENT PLACEMENT Right 11/15/2023   Procedure: CYSTOSCOPY, WITH RETROGRADE PYELOGRAM AND URETERAL STENT INSERTION;  Surgeon: Selma Donnice SAUNDERS, MD;  Location: [facility] OR;  Service: Urology;  Laterality: Right;   CYSTOSCOPY WITH RETROGRADE PYELOGRAM, URETEROSCOPY AND STENT PLACEMENT Left 08/14/2022   Procedure: CYSTOSCOPY WITH RETROGRADE PYELOGRAM;  Surgeon: Selma Donnice SAUNDERS, MD;  Location: [facility] OR;  Service: Urology;  Laterality: Left;   CYSTOSCOPY WITH URETEROSCOPY AND STENT PLACEMENT Left 08/14/2022   Procedure: URETEROSCOPY AND STENT PLACEMENT;  Surgeon: Selma Donnice SAUNDERS, MD;  Location: [facility] OR;  Service: Urology;  Laterality: Left;   CYSTOSCOPY/URETEROSCOPY/HOLMIUM LASER/STENT PLACEMENT Right 09/30/2021   Procedure: CYSTOSCOPY/ RETROGRADE/URETEROSCOPY/HOLMIUM LASER/STENT PLACEMENT;  Surgeon: Selma Donnice SAUNDERS, MD;  Location: [facility];  Service: Urology;  Laterality: Right;   CYSTOSCOPY/URETEROSCOPY/HOLMIUM LASER/STENT PLACEMENT Left 09/12/2022   Procedure: CYSTOSCOPY/LEFT RETROGRADE PYELOGRAM/LEFT URETEROSCOPY/HOLMIUM LASER/LEFT STENT EXCHANGE;  Surgeon: Selma Donnice SAUNDERS, MD;  Location: [facility];  Service: Urology;  Laterality: Left;  75 MINUTES NEEDED FOR CASE   CYSTOSCOPY/URETEROSCOPY/HOLMIUM LASER/STENT PLACEMENT Right 10/30/2023   Procedure: CYSTOSCOPY/URETEROSCOPY/HOLMIUM LASER/STENT PLACEMENT;  Surgeon: Selma Donnice SAUNDERS, MD;  Location: [facility];  Service: Urology;  Laterality: Right;   CYSTOSCOPY/URETEROSCOPY/HOLMIUM LASER/STENT PLACEMENT Bilateral 01/18/2024   Procedure:  CYSTOSCOPY/URETEROSCOPY/HOLMIUM LASER/STENT PLACEMENT;  Surgeon: Selma Donnice SAUNDERS, MD;  Location: [facility];  Service: Urology;  Laterality: Bilateral;   IR RADIOLOGIST EVAL & MGMT  08/12/2016   IR RADIOLOGIST EVAL &  MGMT  08/21/2016   KNEE ARTHROSCOPY     left    TOTAL KNEE ARTHROPLASTY  07/29/2011   Procedure: TOTAL KNEE ARTHROPLASTY;  Surgeon: Donnice JONETTA Car, MD;  Location: [facility];  Service: Orthopedics;  Laterality: Right;   TOTAL KNEE ARTHROPLASTY Left 12/14/2012   Procedure: LEFT TOTAL KNEE ARTHROPLASTY;  Surgeon: Donnice JONETTA Car, MD;  Location: [facility];  Service: Orthopedics;  Laterality: Left;    Allergies: Allergies[1]  Family History  Problem Relation Age of Onset   Stroke Mother    COPD Father    Lymphoma Sister    Alcoholism Sister     Social History:  reports that she quit smoking about 41 years ago. Her smoking use included cigarettes. She started smoking about 48 years ago. She has a 3.5 pack-year smoking history. She has never been exposed to tobacco smoke. She has never used smokeless tobacco. She reports current alcohol use. She reports that she does not currently use drugs after having used the following drugs: Marijuana.  ROS: A complete review of systems was performed.  All systems are negative except for pertinent findings as noted.  Physical Exam:  Vital signs in last 24 hours: Temp:  [98.1 F (36.7 C)] 98.1 F (36.7 C) (01/16 0907) Pulse Rate:  [97] 97 (01/16 0907) Resp:  [16] 16 (01/16 0907) BP: (157)/(93) 157/93 (01/16 0907) SpO2:  [98 %] 98 % (01/16 0907) Weight:  [129.3 kg] 129.3 kg (01/16 0918) Constitutional:  Alert and oriented, No acute distress Cardiovascular: Regular rate and rhythm Respiratory: Normal respiratory effort, Lungs clear bilaterally GI: Abdomen is soft, nontender, nondistended, no abdominal masses GU: No CVA tenderness Lymphatic: No lymphadenopathy Neurologic: Grossly intact, no focal deficits Psychiatric: Normal mood and affect  Laboratory Data:  No results for input(s): WBC, HGB, HCT, PLT in the last 72 hours.  No results for input(s): NA, K, CL, GLUCOSE, BUN, CALCIUM , CREATININE in the last 72 hours.  Invalid  input(s): CO3   No results found. However, due to the size of the patient record, not all encounters were searched. Please check Results Review for a complete set of results. No results found for this or any previous visit (from the past 240 hours).  Renal Function: No results for input(s): CREATININE in the last 168 hours. CrCl cannot be calculated (Patient's most recent lab result is older than the maximum 21 days allowed.).  Radiologic Imaging: No results found.  I independently reviewed the above imaging studies.  Assessment and Plan [name] is a 64 y.o. female with  bilateral renal stones here for staged bilateral ureteroscopy with laser lithotripsy and basket extraction of stones. Denies fevers, chills, dysuria.  -The risks, benefits and alternatives of cystoscopy with bilateral ureteroscopy with laser lithotripsy and bilateral JJ stent placement was discussed with the patient.  Risks include, but are not limited to: bleeding, urinary tract infection, ureteral injury, ureteral stricture disease, chronic pain, urinary symptoms, bladder injury, stent migration, the need for nephrostomy tube placement, MI, CVA, DVT, PE and the inherent risks with general anesthesia.  The patient voices understanding and wishes to proceed.    Matt R. Porchea Charrier MD 02/26/2024, 9:34 AM  [facility] Pager: [phone]): [phone]     [1]  Allergies Allergen Reactions   Morphine  Itching  Shrimp [Shellfish Allergy] Itching and Other (See Comments)    Tongue burns also   Tramadol  Other (See Comments)    Caused confusion   Covid-19 (Mrna) Vaccine Hives   Diltiazem  Hcl Itching and Other (See Comments)    Pt with itching of the feet when bolus given   Latex Rash

## 2024-02-26 NOTE — Anesthesia Postprocedure Evaluation (Signed)
"   Anesthesia Post Note  Patient: [name]  Procedure(s) Performed: CYSTOSCOPY/URETEROSCOPY/HOLMIUM LASER/STENT PLACEMENT (Bilateral) CYSTOSCOPY, WITH RETROGRADE PYELOGRAM (Bilateral)     Patient location during evaluation: PACU Anesthesia Type: General Level of consciousness: awake and alert Pain management: pain level controlled Vital Signs Assessment: post-procedure vital signs reviewed and stable Respiratory status: spontaneous breathing, nonlabored ventilation, respiratory function stable and patient connected to nasal cannula oxygen Cardiovascular status: blood pressure returned to baseline and stable Postop Assessment: no apparent nausea or vomiting Anesthetic complications: no   No notable events documented.  Last Vitals:  Vitals:   02/26/24 1141 02/26/24 1145  BP: (!) 150/88 137/86  Pulse: 90 87  Resp: 16   Temp: 36.7 C   SpO2: 100% 100%    Last Pain:  Vitals:   02/26/24 1145  TempSrc:   PainSc: 0-No pain                 Rome Ade      "

## 2024-02-26 NOTE — Anesthesia Procedure Notes (Addendum)
 Procedure Name: Intubation Date/Time: 02/26/2024 10:36 AM  Performed by: Dartha Meckel, CRNAPre-anesthesia Checklist: Patient identified, Emergency Drugs available, Suction available and Patient being monitored Patient Re-evaluated:Patient Re-evaluated prior to induction Oxygen Delivery Method: Circle system utilized Preoxygenation: Pre-oxygenation with 100% oxygen Induction Type: IV induction and Rapid sequence Laryngoscope Size: Glidescope and 3 Grade View: Grade I Tube type: Oral Tube size: 7.0 mm Number of attempts: 1 Airway Equipment and Method: Stylet and Oral airway Placement Confirmation: ETT inserted through vocal cords under direct vision, positive ETCO2 and breath sounds checked- equal and bilateral Secured at: 21 cm Tube secured with: Tape Dental Injury: Teeth and Oropharynx as per pre-operative assessment

## 2024-02-26 NOTE — Transfer of Care (Signed)
 Immediate Anesthesia Transfer of Care Note  Patient: [name]  Procedure(s) Performed: CYSTOSCOPY/URETEROSCOPY/HOLMIUM LASER/STENT PLACEMENT (Bilateral) CYSTOSCOPY, WITH RETROGRADE PYELOGRAM (Bilateral)  Patient Location: PACU  Anesthesia Type:General  Level of Consciousness: drowsy and patient cooperative  Airway & Oxygen Therapy: Patient Spontanous Breathing and Patient connected to face mask oxygen  Post-op Assessment: Report given to RN and Post -op Vital signs reviewed and stable  Post vital signs: Reviewed and stable  Last Vitals:  Vitals Value Taken Time  BP 137/86 02/26/24 11:45  Temp 36.7 C 02/26/24 11:41  Pulse 87 02/26/24 11:46  Resp 16 02/26/24 11:41  SpO2 100 % 02/26/24 11:46  Vitals shown include unfiled device data.  Last Pain:  Vitals:   02/26/24 1141  TempSrc:   PainSc: 0-No pain         Complications: No notable events documented.

## 2024-02-26 NOTE — Op Note (Signed)
 Operative Note   Preoperative diagnosis:  1.  Bilateral renal stones   Postoperative diagnosis: 1.  Same   Procedure(s): 1.  Cystoscopy 2. Bilateral ureteroscopy with laser lithotripsy and basket extraction of stones 3. Bilateral retrograde pyelogram 4. Bilateral ureteral stent placement 5. Fluoroscopy with intraoperative interpretation   Surgeon: Donnice Siad, MD   Assistants:  None   Anesthesia:  General   Complications:  None   EBL:  Minimal   Specimens: None   Drains/Catheters: 1.  Right 6Fr x 26cm ureteral stent with tether string 2.  Left 6Fr x 26cm ureteral stent with tether string   Intraoperative findings:   Cystoscopy demonstrated known dome fistula with some irregular appearance here however no suspicious appearance of malignancy.   Bilateral ureteral orifices had some edema with bilateral indwelling stents present. Right retrograde pyelogram demonstrated severe right hydronephrosis with stent in appropriate position. Left retrograde pyelogram demonstrated moderate left hydronephrosis with stent in appropriate position. Right ureteroscopy demonstrated significant amount of debris in her collecting system that was aspirated and ultimately identified a few small stones. These were fragmented and extracted. Left ureteroscopy some and demonstrated debris in her left collecting system and a small stone 4mm that was fragmented and basket extracted. Successful bilateral ureteral stent placement.   Indication:  [name] is a 64 y.o. female with a history of bilateral renal stones here for for stage bilateral ureteroscopy with laser lithotripsy and basket extraction of stones.   Description of procedure: After informed consent was obtained from the patient, the patient was identified and taken to the operating room and placed in the supine position.  General anesthesia was administered as well as perioperative IV antibiotics.  At the beginning of the case, a  time-out was performed to properly identify the patient, the surgery to be performed, and the surgical site.  Sequential compression devices were applied to the lower extremities at the beginning of the case for DVT prophylaxis.  The patient was then placed in the dorsal lithotomy supine position, prepped and draped in sterile fashion.   We then passed the 21-French rigid cystoscope through the urethra and into the bladder under vision without any difficulty, noting a normal urethra.  A systematic evaluation of the bladder revealed no evidence of any suspicious bladder lesions.  Ureteral orifices were in normal position.     The distal aspect of the ureteral stent was seen protruding from the right ureteral orifice.  We then used the alligator-tooth forceps and grasped the distal end of the ureteral stent and brought it out the urethral meatus while watching the proximal coil straighten out nicely on fluoroscopy. Through the ureteral stent, we then passed a 0.038 sensor wire up to the level of the renal pelvis.  The ureteral stent was then removed, leaving the sensor wire up the right ureter.   The distal aspect of the ureteral stent was seen protruding from the left ureteral orifice.  We then used the alligator-tooth forceps and grasped the distal end of the ureteral stent and brought it out the urethral meatus while watching the proximal coil straighten out nicely on fluoroscopy. Through the ureteral stent, we then passed a 0.038 sensor wire up to the level of the renal pelvis.  The ureteral stent was then removed, leaving the sensor wire up the left ureter.       A semi-rigid ureteroscope was passed alongside the wire up the distal right ureter which appeared normal. A second 0.038 sensor wire was passed under  direct vision and the semirigid scope was removed. The flexible ureteroscope was advanced into the collecting system. The collecting system was inspected.  She has significant amount of debris  present and this is aspirated using several large syringes.  Ultimately a 4mm stone was visualized. Using the 272 micron holmium laser fiber, the stones were dusted completely.  I then basket extracted the stone. With the ureteroscope in the kidney, a gentle pyelogram was performed to delineate the calyceal system and we evaluated the calyces systematically. We encountered no further large stones.    A semi-rigid ureteroscope was passed alongside the wire up the distal left ureter which appeared normal.  Several wire was passed and dual-lumen flexible ureteroscope was passed into the left collecting system.  Again, debris was aspirated.  Encountered approximately 4 mm left upper pole stone.  This was dusted in its entirety.  Repeat retrograde pyelograms performed noting no large stones present. I basket extracted the stone. There were no fragments remaining.   We then withdrew the ureteroscope back down the ureter, noting no evidence of any stones along the course of the ureter.  Prior to removing the ureteroscope, we did pass the Glidewire back up to the ureter to the renal pelvis.  Once the ureteroscope was removed, we then used the Glidewire under fluoroscopic guidance and passed up a 6-French x 26 cm double-pigtail ureteral stent up the ureter, making sure that the proximal and distal ends coiled within the kidney and bladder respectively.   I left long tether strings attached to each stent.     The cystoscope was then advanced back into the bladder under vision.  We were able to see the distal stents coiling nicely within the bladder.  The bladder was then emptied with irrigation solution.  The cystoscope was then removed.     The patient tolerated the procedure well and there was no complication. Patient was awoken from anesthesia and taken to the recovery room in stable condition. I was present and scrubbed for the entirety of the case.   Plan:  Patient will be discharged home and will remove  stents on Monday AM.     Matt R. Maleiah Dula MD [facility]  Pager: [phone]

## 2024-02-26 NOTE — Anesthesia Preprocedure Evaluation (Signed)
 "                                  Anesthesia Evaluation  Patient identified by MRN, date of birth, ID band Patient awake  General Assessment Comment:  Patient vomiting in preop, attributing to anxiety. Also feels like her acid reflux is poorly controlled today despite taking medicine.  Reviewed: Allergy & Precautions, NPO status , Patient's Chart, lab work & pertinent test results  History of Anesthesia Complications Negative for: history of anesthetic complications  Airway Mallampati: III  TM Distance: >3 FB Neck ROM: Full    Dental no notable dental hx. (+) Teeth Intact   Pulmonary asthma , neg sleep apnea, neg COPD, Patient abstained from smoking.Not current smoker, former smoker   Pulmonary exam normal breath sounds clear to auscultation       Cardiovascular Exercise Tolerance: Good METShypertension, + Peripheral Vascular Disease  (-) CAD and (-) Past MI (-) dysrhythmias  Rhythm:Regular Rate:Normal - Systolic murmurs  1. Left ventricular ejection fraction, by estimation, is 45 to 50%. The left ventricle has mildly decreased function. The left ventricle demonstrates global hypokinesis. There is mild left ventricular hypertrophy. Left ventricular diastolic parameters are consistent with Grade I diastolic dysfunction (impaired relaxation). Elevated left atrial pressure.  2. Right ventricular systolic function is normal. The right ventricular size is normal.  3. Left atrial size was mildly dilated.  4. The mitral valve is normal in structure. No evidence of mitral valve regurgitation. No evidence of mitral stenosis.  5. The aortic valve has an indeterminant number of cusps. Aortic valve regurgitation is trivial. No aortic stenosis is present.  6. The inferior vena cava is normal in size with greater than 50% respiratory variability, suggesting right atrial pressure of 3 mmHg    Neuro/Psych  PSYCHIATRIC DISORDERS Anxiety Depression    negative neurological  ROS     GI/Hepatic ,GERD  Medicated and Poorly Controlled,,(+)     (-) substance abuse    Endo/Other  neg diabetes  Class 4 obesity  Renal/GU Renal diseasenegative Renal ROS     Musculoskeletal   Abdominal  (+) + obese  Peds  Hematology   Anesthesia Other Findings Past Medical History: 07/28/2016: Abscess of bladder No date: Alcohol dependence (HCC) 12/06/2013: Allergic rhinitis No date: Anxiety 01/15/2024: Aortic atherosclerosis 03/16/2015: Asthma 04/01/2020: Atrial flutter with rapid ventricular response (HCC) 04/05/2010: COLONIC POLYPS, HX OF 09/2023: COVID-19 04/05/2010: DIVERTICULITIS, HX OF No date: DJD (degenerative joint disease)     Comment:  right knee, mot to severe No date: Dysrhythmia No date: Fatty liver No date: GERD (gastroesophageal reflux disease)     Comment:  no meds No date: Heart murmur     Comment:  hx of  No date: Hemorrhoids 01/15/2024: Hepatic steatosis No date: History of kidney stones No date: Hyperlipidemia No date: Hypertension 12/06/2013: Impaired glucose tolerance No date: Kidney stones No date: Morbid obesity with BMI of 50.0-59.9, adult (HCC) 05/24/2022: Nausea & vomiting No date: Peripheral vascular disease No date: Pulmonary embolism (HCC) 01/15/2024: SVT (supraventricular tachycardia)  Reproductive/Obstetrics                              Anesthesia Physical Anesthesia Plan  ASA: 3  Anesthesia Plan: General   Post-op Pain Management:    Induction: Intravenous and Rapid sequence  PONV Risk Score and Plan: 4 or greater and Ondansetron ,  Dexamethasone , Midazolam  and Treatment may vary due to age or medical condition  Airway Management Planned: Oral ETT and Video Laryngoscope Planned  Additional Equipment: None  Intra-op Plan:   Post-operative Plan: Extubation in OR  Informed Consent: I have reviewed the patients History and Physical, chart, labs and discussed the procedure including the  risks, benefits and alternatives for the proposed anesthesia with the patient or authorized representative who has indicated his/her understanding and acceptance.     Dental advisory given  Plan Discussed with: CRNA and Surgeon  Anesthesia Plan Comments: (Discussed risks of anesthesia with patient, including PONV, sore throat, lip/dental/eye damage. Rare risks discussed as well, such as cardiorespiratory and neurological sequelae, and allergic reactions. Discussed the role of CRNA in patient's perioperative care. Patient understands. Patient informed about increased incidence of above perioperative risk due to high BMI. Patient understands.  )         Anesthesia Quick Evaluation  "

## 2024-02-27 ENCOUNTER — Encounter (HOSPITAL_COMMUNITY): Payer: Self-pay | Admitting: Urology

## 2024-03-07 LAB — STONE ANALYSIS
Uric Acid Calculi: 100 %
Weight Calculi: 13 mg
